# Patient Record
Sex: Female | Born: 1955
Health system: Southern US, Community
[De-identification: ages and names within clinical notes are randomized; demographics above are authoritative.]

## PROBLEM LIST (undated history)

## (undated) DIAGNOSIS — E0590 Thyrotoxicosis, unspecified without thyrotoxic crisis or storm: Secondary | ICD-10-CM

## (undated) DIAGNOSIS — M545 Low back pain, unspecified: Secondary | ICD-10-CM

## (undated) DIAGNOSIS — Z8679 Personal history of other diseases of the circulatory system: Secondary | ICD-10-CM

## (undated) DIAGNOSIS — G43909 Migraine, unspecified, not intractable, without status migrainosus: Secondary | ICD-10-CM

## (undated) DIAGNOSIS — Z86711 Personal history of pulmonary embolism: Secondary | ICD-10-CM

## (undated) DIAGNOSIS — M6282 Rhabdomyolysis: Secondary | ICD-10-CM

## (undated) DIAGNOSIS — S301XXA Contusion of abdominal wall, initial encounter: Secondary | ICD-10-CM

## (undated) DIAGNOSIS — I509 Heart failure, unspecified: Secondary | ICD-10-CM

## (undated) DIAGNOSIS — K219 Gastro-esophageal reflux disease without esophagitis: Secondary | ICD-10-CM

## (undated) DIAGNOSIS — F329 Major depressive disorder, single episode, unspecified: Secondary | ICD-10-CM

## (undated) DIAGNOSIS — Z8489 Family history of other specified conditions: Secondary | ICD-10-CM

## (undated) DIAGNOSIS — I10 Essential (primary) hypertension: Secondary | ICD-10-CM

## (undated) DIAGNOSIS — Z20828 Contact with and (suspected) exposure to other viral communicable diseases: Secondary | ICD-10-CM

## (undated) DIAGNOSIS — F419 Anxiety disorder, unspecified: Secondary | ICD-10-CM

## (undated) DIAGNOSIS — J9621 Acute and chronic respiratory failure with hypoxia: Secondary | ICD-10-CM

## (undated) DIAGNOSIS — F32A Depression, unspecified: Secondary | ICD-10-CM

## (undated) DIAGNOSIS — E119 Type 2 diabetes mellitus without complications: Secondary | ICD-10-CM

## (undated) DIAGNOSIS — J449 Chronic obstructive pulmonary disease, unspecified: Secondary | ICD-10-CM

## (undated) DIAGNOSIS — W19XXXA Unspecified fall, initial encounter: Secondary | ICD-10-CM

## (undated) DIAGNOSIS — M1990 Unspecified osteoarthritis, unspecified site: Secondary | ICD-10-CM

## (undated) DIAGNOSIS — E662 Morbid (severe) obesity with alveolar hypoventilation: Secondary | ICD-10-CM

## (undated) DIAGNOSIS — J209 Acute bronchitis, unspecified: Secondary | ICD-10-CM

## (undated) DIAGNOSIS — K922 Gastrointestinal hemorrhage, unspecified: Secondary | ICD-10-CM

## (undated) DIAGNOSIS — G8929 Other chronic pain: Secondary | ICD-10-CM

## (undated) DIAGNOSIS — S86892A Other injury of other muscle(s) and tendon(s) at lower leg level, left leg, initial encounter: Secondary | ICD-10-CM

## (undated) DIAGNOSIS — I480 Paroxysmal atrial fibrillation: Secondary | ICD-10-CM

## (undated) DIAGNOSIS — I2699 Other pulmonary embolism without acute cor pulmonale: Secondary | ICD-10-CM

## (undated) HISTORY — PX: DILATION AND CURETTAGE OF UTERUS: SHX78

## (undated) HISTORY — PX: EXCISIONAL HEMORRHOIDECTOMY: SHX1541

## (undated) HISTORY — PX: APPENDECTOMY: SHX54

## (undated) HISTORY — PX: TUBAL LIGATION: SHX77

## (undated) HISTORY — PX: BREAST CYST EXCISION: SHX579

## (undated) HISTORY — PX: ABDOMINAL HYSTERECTOMY: SHX81

## (undated) HISTORY — PX: WISDOM TOOTH EXTRACTION: SHX21

---
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
## 1998-03-15 ENCOUNTER — Emergency Department (HOSPITAL_COMMUNITY): Admission: EM | Admit: 1998-03-15 | Discharge: 1998-03-15 | Payer: Self-pay | Admitting: Emergency Medicine

## 1998-06-06 ENCOUNTER — Emergency Department (HOSPITAL_COMMUNITY): Admission: EM | Admit: 1998-06-06 | Discharge: 1998-06-06 | Payer: Self-pay | Admitting: Emergency Medicine

## 1998-10-03 ENCOUNTER — Encounter: Payer: Self-pay | Admitting: Emergency Medicine

## 1998-10-03 ENCOUNTER — Emergency Department (HOSPITAL_COMMUNITY): Admission: EM | Admit: 1998-10-03 | Discharge: 1998-10-03 | Payer: Self-pay | Admitting: Emergency Medicine

## 1999-12-04 ENCOUNTER — Emergency Department (HOSPITAL_COMMUNITY): Admission: EM | Admit: 1999-12-04 | Discharge: 1999-12-04 | Payer: Self-pay | Admitting: Emergency Medicine

## 2000-06-18 ENCOUNTER — Emergency Department (HOSPITAL_COMMUNITY): Admission: EM | Admit: 2000-06-18 | Discharge: 2000-06-18 | Payer: Self-pay | Admitting: Emergency Medicine

## 2000-07-14 ENCOUNTER — Emergency Department (HOSPITAL_COMMUNITY): Admission: EM | Admit: 2000-07-14 | Discharge: 2000-07-14 | Payer: Self-pay | Admitting: Emergency Medicine

## 2001-11-06 ENCOUNTER — Emergency Department (HOSPITAL_COMMUNITY): Admission: EM | Admit: 2001-11-06 | Discharge: 2001-11-06 | Payer: Self-pay

## 2001-11-21 ENCOUNTER — Ambulatory Visit (HOSPITAL_COMMUNITY): Admission: RE | Admit: 2001-11-21 | Discharge: 2001-11-21 | Payer: Self-pay | Admitting: Family Medicine

## 2002-03-29 ENCOUNTER — Encounter: Payer: Self-pay | Admitting: Family Medicine

## 2002-03-29 ENCOUNTER — Ambulatory Visit (HOSPITAL_COMMUNITY): Admission: RE | Admit: 2002-03-29 | Discharge: 2002-03-29 | Payer: Self-pay | Admitting: Family Medicine

## 2002-05-15 ENCOUNTER — Encounter: Payer: Self-pay | Admitting: Internal Medicine

## 2002-05-15 ENCOUNTER — Encounter: Payer: Self-pay | Admitting: Emergency Medicine

## 2002-05-15 ENCOUNTER — Inpatient Hospital Stay (HOSPITAL_COMMUNITY): Admission: EM | Admit: 2002-05-15 | Discharge: 2002-05-16 | Payer: Self-pay | Admitting: Emergency Medicine

## 2002-05-16 ENCOUNTER — Encounter: Payer: Self-pay | Admitting: Cardiovascular Disease

## 2002-12-09 ENCOUNTER — Emergency Department (HOSPITAL_COMMUNITY): Admission: EM | Admit: 2002-12-09 | Discharge: 2002-12-09 | Payer: Self-pay | Admitting: Emergency Medicine

## 2003-06-28 HISTORY — PX: THYROIDECTOMY, PARTIAL: SHX18

## 2003-07-15 ENCOUNTER — Ambulatory Visit (HOSPITAL_COMMUNITY): Admission: RE | Admit: 2003-07-15 | Discharge: 2003-07-15 | Payer: Self-pay | Admitting: Family Medicine

## 2003-09-29 ENCOUNTER — Encounter: Admission: RE | Admit: 2003-09-29 | Discharge: 2003-09-29 | Payer: Self-pay | Admitting: Family Medicine

## 2003-11-04 ENCOUNTER — Encounter: Admission: RE | Admit: 2003-11-04 | Discharge: 2003-11-04 | Payer: Self-pay | Admitting: Family Medicine

## 2003-11-19 ENCOUNTER — Encounter: Admission: RE | Admit: 2003-11-19 | Discharge: 2003-11-19 | Payer: Self-pay | Admitting: Family Medicine

## 2003-12-02 ENCOUNTER — Encounter: Admission: RE | Admit: 2003-12-02 | Discharge: 2003-12-02 | Payer: Self-pay | Admitting: Sports Medicine

## 2004-01-19 ENCOUNTER — Encounter: Admission: RE | Admit: 2004-01-19 | Discharge: 2004-01-19 | Payer: Self-pay | Admitting: Family Medicine

## 2004-01-20 ENCOUNTER — Encounter: Admission: RE | Admit: 2004-01-20 | Discharge: 2004-01-20 | Payer: Self-pay | Admitting: Family Medicine

## 2004-02-06 ENCOUNTER — Encounter: Admission: RE | Admit: 2004-02-06 | Discharge: 2004-02-06 | Payer: Self-pay | Admitting: Family Medicine

## 2004-02-13 ENCOUNTER — Encounter: Admission: RE | Admit: 2004-02-13 | Discharge: 2004-02-13 | Payer: Self-pay | Admitting: Sports Medicine

## 2004-03-30 ENCOUNTER — Encounter (INDEPENDENT_AMBULATORY_CARE_PROVIDER_SITE_OTHER): Payer: Self-pay | Admitting: Family Medicine

## 2004-03-30 ENCOUNTER — Encounter (INDEPENDENT_AMBULATORY_CARE_PROVIDER_SITE_OTHER): Payer: Self-pay | Admitting: *Deleted

## 2004-03-30 ENCOUNTER — Observation Stay (HOSPITAL_COMMUNITY): Admission: RE | Admit: 2004-03-30 | Discharge: 2004-03-31 | Payer: Self-pay | Admitting: General Surgery

## 2004-04-01 ENCOUNTER — Emergency Department (HOSPITAL_COMMUNITY): Admission: EM | Admit: 2004-04-01 | Discharge: 2004-04-01 | Payer: Self-pay | Admitting: Emergency Medicine

## 2004-07-14 ENCOUNTER — Ambulatory Visit: Payer: Self-pay | Admitting: Family Medicine

## 2004-08-20 ENCOUNTER — Ambulatory Visit: Payer: Self-pay | Admitting: Family Medicine

## 2004-11-19 ENCOUNTER — Ambulatory Visit: Payer: Self-pay | Admitting: Family Medicine

## 2004-12-10 ENCOUNTER — Emergency Department (HOSPITAL_COMMUNITY): Admission: EM | Admit: 2004-12-10 | Discharge: 2004-12-10 | Payer: Self-pay | Admitting: Emergency Medicine

## 2004-12-14 ENCOUNTER — Emergency Department (HOSPITAL_COMMUNITY): Admission: EM | Admit: 2004-12-14 | Discharge: 2004-12-14 | Payer: Self-pay | Admitting: *Deleted

## 2004-12-22 ENCOUNTER — Ambulatory Visit: Payer: Self-pay | Admitting: Family Medicine

## 2005-01-14 ENCOUNTER — Ambulatory Visit: Payer: Self-pay | Admitting: Family Medicine

## 2005-01-23 ENCOUNTER — Encounter (INDEPENDENT_AMBULATORY_CARE_PROVIDER_SITE_OTHER): Payer: Self-pay | Admitting: Family Medicine

## 2005-01-23 ENCOUNTER — Emergency Department (HOSPITAL_COMMUNITY): Admission: EM | Admit: 2005-01-23 | Discharge: 2005-01-23 | Payer: Self-pay | Admitting: Emergency Medicine

## 2005-03-11 ENCOUNTER — Encounter: Admission: RE | Admit: 2005-03-11 | Discharge: 2005-03-11 | Payer: Self-pay | Admitting: Allergy and Immunology

## 2005-03-13 ENCOUNTER — Emergency Department (HOSPITAL_COMMUNITY): Admission: EM | Admit: 2005-03-13 | Discharge: 2005-03-14 | Payer: Self-pay | Admitting: Emergency Medicine

## 2005-04-08 ENCOUNTER — Ambulatory Visit: Payer: Self-pay | Admitting: Family Medicine

## 2005-04-12 ENCOUNTER — Emergency Department (HOSPITAL_COMMUNITY): Admission: EM | Admit: 2005-04-12 | Discharge: 2005-04-13 | Payer: Self-pay | Admitting: Emergency Medicine

## 2005-05-18 ENCOUNTER — Ambulatory Visit: Payer: Self-pay | Admitting: Family Medicine

## 2005-06-01 ENCOUNTER — Ambulatory Visit: Payer: Self-pay | Admitting: Family Medicine

## 2005-06-27 HISTORY — PX: NASAL SINUS SURGERY: SHX719

## 2005-07-01 ENCOUNTER — Encounter: Admission: RE | Admit: 2005-07-01 | Discharge: 2005-07-01 | Payer: Self-pay | Admitting: Otolaryngology

## 2005-10-04 ENCOUNTER — Ambulatory Visit: Payer: Self-pay | Admitting: Family Medicine

## 2005-10-10 ENCOUNTER — Encounter (INDEPENDENT_AMBULATORY_CARE_PROVIDER_SITE_OTHER): Payer: Self-pay | Admitting: *Deleted

## 2005-10-10 ENCOUNTER — Ambulatory Visit (HOSPITAL_BASED_OUTPATIENT_CLINIC_OR_DEPARTMENT_OTHER): Admission: RE | Admit: 2005-10-10 | Discharge: 2005-10-10 | Payer: Self-pay | Admitting: Otolaryngology

## 2005-11-03 ENCOUNTER — Emergency Department (HOSPITAL_COMMUNITY): Admission: EM | Admit: 2005-11-03 | Discharge: 2005-11-03 | Payer: Self-pay | Admitting: Emergency Medicine

## 2006-01-28 ENCOUNTER — Observation Stay (HOSPITAL_COMMUNITY): Admission: EM | Admit: 2006-01-28 | Discharge: 2006-01-28 | Payer: Self-pay | Admitting: Emergency Medicine

## 2006-02-03 ENCOUNTER — Ambulatory Visit: Payer: Self-pay | Admitting: Family Medicine

## 2006-06-17 ENCOUNTER — Emergency Department (HOSPITAL_COMMUNITY): Admission: EM | Admit: 2006-06-17 | Discharge: 2006-06-17 | Payer: Self-pay | Admitting: Emergency Medicine

## 2006-08-17 ENCOUNTER — Encounter: Payer: Self-pay | Admitting: Family Medicine

## 2006-08-17 ENCOUNTER — Ambulatory Visit: Payer: Self-pay | Admitting: Family Medicine

## 2006-08-17 LAB — CONVERTED CEMR LAB
Sed Rate: 36 mm/hr — ABNORMAL HIGH (ref 0–22)
TSH: 2.103 microintl units/mL (ref 0.350–5.50)

## 2006-08-24 DIAGNOSIS — I10 Essential (primary) hypertension: Secondary | ICD-10-CM

## 2006-08-24 DIAGNOSIS — K219 Gastro-esophageal reflux disease without esophagitis: Secondary | ICD-10-CM

## 2006-08-24 DIAGNOSIS — J45909 Unspecified asthma, uncomplicated: Secondary | ICD-10-CM

## 2006-08-24 DIAGNOSIS — J309 Allergic rhinitis, unspecified: Secondary | ICD-10-CM | POA: Insufficient documentation

## 2006-08-24 DIAGNOSIS — F329 Major depressive disorder, single episode, unspecified: Secondary | ICD-10-CM

## 2006-08-24 DIAGNOSIS — G2589 Other specified extrapyramidal and movement disorders: Secondary | ICD-10-CM

## 2006-09-04 ENCOUNTER — Telehealth: Payer: Self-pay | Admitting: *Deleted

## 2006-09-18 ENCOUNTER — Emergency Department (HOSPITAL_COMMUNITY): Admission: EM | Admit: 2006-09-18 | Discharge: 2006-09-18 | Payer: Self-pay | Admitting: Emergency Medicine

## 2006-09-26 ENCOUNTER — Telehealth: Payer: Self-pay | Admitting: Family Medicine

## 2006-09-26 ENCOUNTER — Emergency Department (HOSPITAL_COMMUNITY): Admission: EM | Admit: 2006-09-26 | Discharge: 2006-09-26 | Payer: Self-pay | Admitting: Emergency Medicine

## 2006-11-08 ENCOUNTER — Ambulatory Visit: Payer: Self-pay | Admitting: Critical Care Medicine

## 2006-12-08 ENCOUNTER — Ambulatory Visit (HOSPITAL_COMMUNITY): Admission: RE | Admit: 2006-12-08 | Discharge: 2006-12-08 | Payer: Self-pay | Admitting: Family Medicine

## 2006-12-08 ENCOUNTER — Encounter (INDEPENDENT_AMBULATORY_CARE_PROVIDER_SITE_OTHER): Payer: Self-pay | Admitting: Family Medicine

## 2007-01-08 ENCOUNTER — Telehealth (INDEPENDENT_AMBULATORY_CARE_PROVIDER_SITE_OTHER): Payer: Self-pay | Admitting: *Deleted

## 2007-01-18 ENCOUNTER — Telehealth: Payer: Self-pay | Admitting: *Deleted

## 2007-01-22 ENCOUNTER — Encounter (INDEPENDENT_AMBULATORY_CARE_PROVIDER_SITE_OTHER): Payer: Self-pay | Admitting: Family Medicine

## 2007-01-22 ENCOUNTER — Ambulatory Visit: Payer: Self-pay | Admitting: Family Medicine

## 2007-01-22 LAB — CONVERTED CEMR LAB
KOH Prep: NEGATIVE
Whiff Test: POSITIVE

## 2007-01-24 LAB — CONVERTED CEMR LAB
Chlamydia, DNA Probe: NEGATIVE
GC Probe Amp, Genital: NEGATIVE

## 2007-03-06 ENCOUNTER — Encounter (INDEPENDENT_AMBULATORY_CARE_PROVIDER_SITE_OTHER): Payer: Self-pay | Admitting: Family Medicine

## 2007-04-13 ENCOUNTER — Ambulatory Visit: Payer: Self-pay | Admitting: Internal Medicine

## 2007-05-07 ENCOUNTER — Emergency Department (HOSPITAL_COMMUNITY): Admission: EM | Admit: 2007-05-07 | Discharge: 2007-05-08 | Payer: Self-pay | Admitting: Emergency Medicine

## 2007-07-13 ENCOUNTER — Emergency Department (HOSPITAL_COMMUNITY): Admission: EM | Admit: 2007-07-13 | Discharge: 2007-07-13 | Payer: Self-pay | Admitting: Emergency Medicine

## 2007-07-13 ENCOUNTER — Telehealth: Payer: Self-pay | Admitting: *Deleted

## 2007-07-23 ENCOUNTER — Telehealth (INDEPENDENT_AMBULATORY_CARE_PROVIDER_SITE_OTHER): Payer: Self-pay | Admitting: *Deleted

## 2007-08-14 ENCOUNTER — Encounter (INDEPENDENT_AMBULATORY_CARE_PROVIDER_SITE_OTHER): Payer: Self-pay | Admitting: Family Medicine

## 2007-08-17 ENCOUNTER — Telehealth: Payer: Self-pay | Admitting: *Deleted

## 2007-09-14 ENCOUNTER — Ambulatory Visit: Payer: Self-pay | Admitting: Family Medicine

## 2007-09-14 DIAGNOSIS — M130 Polyarthritis, unspecified: Secondary | ICD-10-CM

## 2007-10-03 ENCOUNTER — Telehealth (INDEPENDENT_AMBULATORY_CARE_PROVIDER_SITE_OTHER): Payer: Self-pay | Admitting: *Deleted

## 2007-10-15 ENCOUNTER — Telehealth: Payer: Self-pay | Admitting: *Deleted

## 2007-10-23 ENCOUNTER — Encounter (INDEPENDENT_AMBULATORY_CARE_PROVIDER_SITE_OTHER): Payer: Self-pay | Admitting: Family Medicine

## 2007-10-24 ENCOUNTER — Emergency Department (HOSPITAL_COMMUNITY): Admission: EM | Admit: 2007-10-24 | Discharge: 2007-10-24 | Payer: Self-pay | Admitting: Emergency Medicine

## 2007-10-26 ENCOUNTER — Ambulatory Visit: Payer: Self-pay | Admitting: Family Medicine

## 2007-11-12 ENCOUNTER — Encounter (INDEPENDENT_AMBULATORY_CARE_PROVIDER_SITE_OTHER): Payer: Self-pay | Admitting: Family Medicine

## 2007-11-15 ENCOUNTER — Encounter (INDEPENDENT_AMBULATORY_CARE_PROVIDER_SITE_OTHER): Payer: Self-pay | Admitting: Family Medicine

## 2007-12-31 ENCOUNTER — Emergency Department (HOSPITAL_COMMUNITY): Admission: EM | Admit: 2007-12-31 | Discharge: 2007-12-31 | Payer: Self-pay | Admitting: Emergency Medicine

## 2007-12-31 ENCOUNTER — Encounter (INDEPENDENT_AMBULATORY_CARE_PROVIDER_SITE_OTHER): Payer: Self-pay | Admitting: Family Medicine

## 2008-01-04 ENCOUNTER — Telehealth (INDEPENDENT_AMBULATORY_CARE_PROVIDER_SITE_OTHER): Payer: Self-pay | Admitting: Family Medicine

## 2008-01-04 ENCOUNTER — Encounter (INDEPENDENT_AMBULATORY_CARE_PROVIDER_SITE_OTHER): Payer: Self-pay | Admitting: Family Medicine

## 2008-01-04 ENCOUNTER — Ambulatory Visit: Payer: Self-pay | Admitting: Family Medicine

## 2008-01-04 DIAGNOSIS — Z87898 Personal history of other specified conditions: Secondary | ICD-10-CM | POA: Insufficient documentation

## 2008-01-04 LAB — CONVERTED CEMR LAB
ALT: 12 units/L (ref 0–35)
AST: 14 units/L (ref 0–37)
Albumin: 3.9 g/dL (ref 3.5–5.2)
Alkaline Phosphatase: 76 units/L (ref 39–117)
BUN: 9 mg/dL (ref 6–23)
CO2: 26 meq/L (ref 19–32)
Calcium: 9.2 mg/dL (ref 8.4–10.5)
Chloride: 99 meq/L (ref 96–112)
Cholesterol: 187 mg/dL (ref 0–200)
Creatinine, Ser: 0.76 mg/dL (ref 0.40–1.20)
Glucose, Bld: 95 mg/dL (ref 70–99)
HDL: 57 mg/dL (ref >39)
LDL Cholesterol: 104 mg/dL — ABNORMAL HIGH (ref 0–99)
Potassium: 3.4 meq/L — ABNORMAL LOW (ref 3.5–5.3)
Sodium: 139 meq/L (ref 135–145)
Total Bilirubin: 0.4 mg/dL (ref 0.3–1.2)
Total CHOL/HDL Ratio: 3.3
Total Protein: 8.1 g/dL (ref 6.0–8.3)
Triglycerides: 132 mg/dL (ref <150)
VLDL: 26 mg/dL (ref 0–40)

## 2008-01-08 ENCOUNTER — Telehealth (INDEPENDENT_AMBULATORY_CARE_PROVIDER_SITE_OTHER): Payer: Self-pay | Admitting: *Deleted

## 2008-01-10 ENCOUNTER — Encounter (INDEPENDENT_AMBULATORY_CARE_PROVIDER_SITE_OTHER): Payer: Self-pay | Admitting: Family Medicine

## 2008-02-18 ENCOUNTER — Telehealth (INDEPENDENT_AMBULATORY_CARE_PROVIDER_SITE_OTHER): Payer: Self-pay | Admitting: *Deleted

## 2008-04-09 ENCOUNTER — Telehealth (INDEPENDENT_AMBULATORY_CARE_PROVIDER_SITE_OTHER): Payer: Self-pay | Admitting: *Deleted

## 2008-04-18 ENCOUNTER — Ambulatory Visit: Payer: Self-pay | Admitting: Family Medicine

## 2008-04-18 DIAGNOSIS — M170 Bilateral primary osteoarthritis of knee: Secondary | ICD-10-CM | POA: Insufficient documentation

## 2008-04-18 DIAGNOSIS — N318 Other neuromuscular dysfunction of bladder: Secondary | ICD-10-CM

## 2008-05-19 ENCOUNTER — Ambulatory Visit: Payer: Self-pay | Admitting: Family Medicine

## 2008-05-19 ENCOUNTER — Encounter (INDEPENDENT_AMBULATORY_CARE_PROVIDER_SITE_OTHER): Payer: Self-pay | Admitting: Family Medicine

## 2008-05-20 ENCOUNTER — Telehealth: Payer: Self-pay | Admitting: *Deleted

## 2008-06-12 ENCOUNTER — Telehealth (INDEPENDENT_AMBULATORY_CARE_PROVIDER_SITE_OTHER): Payer: Self-pay | Admitting: Family Medicine

## 2008-08-19 ENCOUNTER — Telehealth: Payer: Self-pay | Admitting: *Deleted

## 2008-09-02 ENCOUNTER — Telehealth (INDEPENDENT_AMBULATORY_CARE_PROVIDER_SITE_OTHER): Payer: Self-pay | Admitting: Family Medicine

## 2009-07-17 ENCOUNTER — Other Ambulatory Visit: Payer: Self-pay | Admitting: Emergency Medicine

## 2009-07-18 ENCOUNTER — Ambulatory Visit: Payer: Self-pay | Admitting: Psychiatry

## 2009-07-18 ENCOUNTER — Inpatient Hospital Stay (HOSPITAL_COMMUNITY): Admission: AD | Admit: 2009-07-18 | Discharge: 2009-08-01 | Payer: Self-pay | Admitting: Psychiatry

## 2009-08-05 ENCOUNTER — Encounter: Payer: Self-pay | Admitting: Family Medicine

## 2009-09-17 ENCOUNTER — Encounter: Payer: Self-pay | Admitting: Family Medicine

## 2009-11-22 ENCOUNTER — Emergency Department (HOSPITAL_COMMUNITY): Admission: EM | Admit: 2009-11-22 | Discharge: 2009-11-22 | Payer: Self-pay | Admitting: Emergency Medicine

## 2009-12-11 ENCOUNTER — Encounter: Payer: Self-pay | Admitting: Family Medicine

## 2010-01-27 ENCOUNTER — Ambulatory Visit: Payer: Self-pay | Admitting: Family Medicine

## 2010-01-29 ENCOUNTER — Ambulatory Visit: Payer: Self-pay | Admitting: Family Medicine

## 2010-02-18 ENCOUNTER — Telehealth: Payer: Self-pay | Admitting: *Deleted

## 2010-03-17 ENCOUNTER — Telehealth: Payer: Self-pay | Admitting: Family Medicine

## 2010-04-17 ENCOUNTER — Emergency Department (HOSPITAL_COMMUNITY): Admission: EM | Admit: 2010-04-17 | Discharge: 2010-04-17 | Payer: Self-pay | Admitting: Pediatrics

## 2010-05-12 ENCOUNTER — Encounter: Payer: Self-pay | Admitting: Family Medicine

## 2010-07-18 ENCOUNTER — Encounter: Payer: Self-pay | Admitting: Family Medicine

## 2010-07-18 ENCOUNTER — Encounter: Payer: Self-pay | Admitting: Otolaryngology

## 2010-07-29 NOTE — Progress Notes (Signed)
Summary: resc   Phone Note Call from Patient   Summary of Call: has a head cold and is dizzy- doesn't feel safe driving - resch to 04/54 Initial call taken by: De Nurse,  March 17, 2010 8:35 AM  Follow-up for Phone Call        will forward message to MD. patient was on schedule for today. Follow-up by: Theresia Lo RN,  March 17, 2010 9:00 AM  Additional Follow-up for Phone Call Additional follow up Details #1::        that is fine to reschedule. Hope she feels better.  Additional Follow-up by: Jamie Brookes MD,  March 17, 2010 1:52 PM

## 2010-07-29 NOTE — Assessment & Plan Note (Signed)
Summary: HTN, got TB test   Vital Signs:  Patient profile:   55 year old female Height:      67.5 inches Weight:      273 pounds BMI:     42.28 Temp:     98.4 degrees F oral Pulse rate:   96 / minute BP sitting:   142 / 88  (left arm) Cuff size:   large  Vitals Entered By: Jimmy Footman, CMA (January 27, 2010 9:01 AM) CC: bp check, med refills Is Patient Diabetic? No Pain Assessment Patient in pain? no       Hearing Screen  20db HL: Left  500 hz: 20db 1000 hz: 20db 2000 hz: 20db 4000 hz: 20db Right  500 hz: 20db 1000 hz: 20db 2000 hz: 20db 4000 hz: 20db   Hearing Testing Entered By: Jimmy Footman, CMA (January 27, 2010 9:59 AM)   Primary Care Provider:  . [facility]  CC:  bp check and med refills.  History of Present Illness: Pt moved back into the area (was living in [place] with son) last November of 2010. She is here today to reestablish care and to get some forms filled out. 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 She needs some forms filled out.   She did not have any medications filled out in our system. She had mutliple meds and it took over 30 mintues of face to face time discussing meds and filling them into our system   Hypertension: Pt has been to see several doctors in the area. She has multiple medications from several different providers of which we did not have any entered into our database. Her BP is slightly elevated today. She has been told by her different providers that she needs to reestablish care with a PCP so we can work on her hypertension. She is not currently taking anything for her hypertension.   Habits & Providers  Alcohol-Tobacco-Diet     Tobacco Status: quit  Current Medications (verified): 1)  Sertraline Hcl 50 Mg Tabs (Sertraline Hcl) .Marland Kitchen.. 1 Po Every Evening 2)  Trazodone Hcl 100 Mg Tabs (Trazodone Hcl) .... Take 1 Pill For Sleep Prn 3)  Abilify 2 Mg Tabs (Aripiprazole) .... Take 1 Tab By  Mouth Qam 4)  Cetirizine Hcl 10 Mg Tabs (Cetirizine Hcl) .Marland Kitchen.. 1 By Mouth Qday For Allergies 5)  Ibuprofen 200 Mg Tabs (Ibuprofen) .... Take 1-2 Pills As Needed Arthritis 6)  Excedrine Migraine .... Take Prn 7)  Omeprazole 20 Mg Cpdr (Omeprazole) .Marland Kitchen.. 1 Tab By Mouth Bid 8)  Proventil Hfa 108 (90 Base) Mcg/act Aers (Albuterol Sulfate) .... Take 1-2 Puffs Up To Every 4 Hours As Needed For Asthma and Shortness of Breath 9)  Prednisone 10 Mg Tabs (Prednisone) .... Pt Takes As Needed and Calls Dr. Willa Rough When She Needs It. 10)  Hydroxyzine Hcl 50 Mg Tabs (Hydroxyzine Hcl) .... Take 1-2 Tabs Qam and 1-2 Tabs Qpm 11)  Voltaren 1 % Gel (Diclofenac Sodium) .... Use Daily On Knees and Shoulder 12)  Tramadol Hcl 50 Mg Tabs (Tramadol Hcl) .Marland Kitchen.. 1 Pill Every 6-8 Hours As Needed For Severe Pain. 13)  Symbicort 160-4.5 Mcg/act Aero (Budesonide-Formoterol Fumarate) .... Take 2 Puffs Every 12 Hours To Prevent Cough or Wheeze 14)  Astepro 0.15 % Soln (Azelastine Hcl) .... Intill 1 Drop in Each Eye 2 Times A Day 15)  Patanase 0.6 % Soln (Olopatadine Hcl) .... Instill 1-2 Sprays Into Each Nostril  Every Pm For Congestion 16)  Hydrochlorothiazide 25 Mg Tabs (Hydrochlorothiazide) .... Take 1 Pill Every Morning For High Blood Pressure  Allergies (verified): 1)  ! Septra 2)  ! Cipro 3)  ! Erythromycin  Review of Systems        vitals reviewed and pertinent negatives and positives seen in HPI   Physical Exam  General:  Well-developed,well-nourished,in no acute distress; alert,appropriate and cooperative throughout examination Lungs:  Normal respiratory effort, chest expands symmetrically. Lungs are clear to auscultation, no crackles or wheezes. Heart:  Normal rate and regular rhythm. S1 and S2 normal without gallop, murmur, click, rub or other extra sounds.   Impression & Recommendations:  Problem # 1:  HYPERTENSION, BENIGN SYSTEMIC (ICD-401.1) Assessment New Pt is seen here to reestablish care after being  gone for 2 years. We spent > 35 minutes going over medications and entering them in the computer with face-to-face discussion and advising. She has slightly elevated BP today. Will begin treatment with HCTZ at 25 mg which is what she use to take. Will see her back in about 3 weeks.   Her updated medication list for this problem includes:    Hydrochlorothiazide 25 Mg Tabs (Hydrochlorothiazide) .Marland Kitchen... Take 1 pill every morning for high blood pressure  Orders: [facility]- Est  Level 4 (99214)  Complete Medication List: 1)  Sertraline Hcl 50 Mg Tabs (Sertraline hcl) .Marland Kitchen.. 1 po every evening 2)  Trazodone Hcl 100 Mg Tabs (Trazodone hcl) .... Take 1 pill for sleep prn 3)  Abilify 2 Mg Tabs (Aripiprazole) .... Take 1 tab by mouth qam 4)  Cetirizine Hcl 10 Mg Tabs (Cetirizine hcl) .Marland Kitchen.. 1 by mouth qday for allergies 5)  Ibuprofen 200 Mg Tabs (Ibuprofen) .... Take 1-2 pills as needed arthritis 6)  Excedrine Migraine  .... Take prn 7)  Omeprazole 20 Mg Cpdr (Omeprazole) .Marland Kitchen.. 1 tab by mouth bid 8)  Proventil Hfa 108 (90 Base) Mcg/act Aers (Albuterol sulfate) .... Take 1-2 puffs up to every 4 hours as needed for asthma and shortness of breath 9)  Prednisone 10 Mg Tabs (Prednisone) .... Pt takes as needed and calls dr. Willa Rough when she needs it. 10)  Hydroxyzine Hcl 50 Mg Tabs (Hydroxyzine hcl) .... Take 1-2 tabs qam and 1-2 tabs qpm 11)  Voltaren 1 % Gel (Diclofenac sodium) .... Use daily on knees and shoulder 12)  Tramadol Hcl 50 Mg Tabs (Tramadol hcl) .Marland Kitchen.. 1 pill every 6-8 hours as needed for severe pain. 13)  Symbicort 160-4.5 Mcg/act Aero (Budesonide-formoterol fumarate) .... Take 2 puffs every 12 hours to prevent cough or wheeze 14)  Astepro 0.15 % Soln (Azelastine hcl) .... Intill 1 drop in each eye 2 times a day 15)  Patanase 0.6 % Soln (Olopatadine hcl) .... Instill 1-2 sprays into each nostril every pm for congestion 16)  Hydrochlorothiazide 25 Mg Tabs (Hydrochlorothiazide) .... Take 1 pill every morning  for high blood pressure  Other Orders: Hearing- [facility] ([address]) TB Skin Test [phone]) Admin 1st Vaccine (64403) Future Orders: Lipid-[facility] ([id number]) ... 02/19/2011 Comp Met-[facility] ([id number]) ... 02/10/2011  Patient Instructions: 1)  It was nice to meet you today.  2)  There are some fasting labs and some other issues we need to address in the future appointments. I would like to see you again n about 3 weeks.  3)  You are getting a TB test today. Come back on Friday to get it "read", then we can finish filling out your paper.  4)  You have started  a new BP med today. Start taking it today.  Prescriptions: HYDROCHLOROTHIAZIDE 25 MG TABS (HYDROCHLOROTHIAZIDE) take 1 pill every morning for high blood pressure  #31 x 1   Entered and Authorized by:    Brookes MD   Signed by:    Brookes MD on 01/27/2010   Method used:   Electronically to        [facility] (retail)       [address]       [place], [place]  [id number]       Ph: [phone]       Fax: [phone]   RxID:   [id number] PATANASE 0.6 % SOLN (OLOPATADINE HCL) instill 1-2 sprays into each nostril every pm for congestion  #1 x 0   Entered and Authorized by:    Brookes MD   Signed by:    Brookes MD on 01/27/2010   Method used:   Historical   RxID:   [id number] ASTEPRO 0.15 % SOLN (AZELASTINE HCL) intill 1 drop in each eye 2 times a day  #1 x 6   Entered and Authorized by:    Brookes MD   Signed by:    Brookes MD on 01/27/2010   Method used:   Historical   RxID:   [id number] SYMBICORT 160-4.5 MCG/ACT AERO (BUDESONIDE-FORMOTEROL FUMARATE) take 2 puffs every 12 hours to prevent cough or wheeze  #2 x 0   Entered and Authorized by:    Brookes MD   Signed by:    Brookes MD on 01/27/2010   Method used:   Historical   RxID:   [id number] TRAMADOL HCL 50 MG TABS (TRAMADOL HCL) 1 pill every 6-8 hours as needed for severe pain.  #30 x 0    Entered and Authorized by:    Brookes MD   Signed by:    Brookes MD on 01/27/2010   Method used:   Historical   RxID:   [id number] HYDROXYZINE HCL 50 MG TABS (HYDROXYZINE HCL) take 1-2 tabs qAM and 1-2 tabs qPM  #120 x 0   Entered and Authorized by:    Brookes MD   Signed by:    Brookes MD on 01/27/2010   Method used:   Historical   RxID:   [id number] PROVENTIL HFA 108 (90 BASE) MCG/ACT AERS (ALBUTEROL SULFATE) take 1-2 puffs up to every 4 hours as needed for asthma and shortness of breath  #1 x 0   Entered and Authorized by:    Brookes MD   Signed by:    Brookes MD on 01/27/2010   Method used:   Historical   RxID:   [id number] OMEPRAZOLE 20 MG CPDR (OMEPRAZOLE) 1 tab by mouth BID  #60 x 0   Entered and Authorized by:    Brookes MD   Signed by:    Brookes MD on 01/27/2010   Method used:   Historical   RxID:   [id number] IBUPROFEN 200 MG TABS (IBUPROFEN) take 1-2 pills as needed arthritis  #30 x 0   Entered and Authorized by:    Brookes MD   Signed by:    Brookes MD on 01/27/2010   Method used:   Historical   RxID:   [id number] CETIRIZINE HCL 10 MG TABS (CETIRIZINE HCL) 1 by mouth qDay for allergies  #30 x 0   Entered and Authorized by:    Brookes MD   Signed by:    Brookes MD on 01/27/2010   Method used:   Historical  RxID:   [id number] ABILIFY 2 MG TABS (ARIPIPRAZOLE) take 1 tab by mouth qAM  #30 x 0   Entered and Authorized by:    Brookes MD   Signed by:    Brookes MD on 01/27/2010   Method used:   Historical   RxID:   [id number] TRAZODONE HCL 100 MG TABS (TRAZODONE HCL) take 1 pill for sleep PRN  #30 x 0   Entered and Authorized by:    Brookes MD   Signed by:    Brookes MD on 01/27/2010   Method used:   Historical   RxID:   [id number] SERTRALINE HCL 50 MG TABS (SERTRALINE HCL) 1 Po every evening  #30 x 0   Entered and Authorized  by:    Brookes MD   Signed by:    Brookes MD on 01/27/2010   Method used:   Historical   RxID:   [id number]    Orders Added: 1)  Lipid-[facility] [[id number]] 2)  Comp Met-[facility] [[id number]] 3)  Hearing- [facility] [92551] 4)  TB Skin Test [86580] 5)  Admin 1st Vaccine [90471] 6)  [facility]- Est  Level 4 [64332]    Immunizations Administered:  PPD Skin Test:    Vaccine Type: PPD    Site: right forearm    Mfr: Sanofi Pasteur    Dose: 0.1 ml    Route: ID    Given by: Jimmy Footman, CMA    Exp. Date: 02/12/2010    Lot #: [id number]

## 2010-07-29 NOTE — Progress Notes (Signed)
Summary: phn msg   Phone Note Call from Patient Call back at Home Phone [phone]   Caller: Patient Summary of Call: Pt did not come to appt today due to having a migraine, but also under the impression that she was to have some sort of testing done before she came to this f/up and it was never scheduled. Initial call taken by: Clydell Hakim,  February 18, 2010 3:22 PM  Follow-up for Phone Call        spoke with pt and informed her that she was to have bloodwork done prior to visit today.  i told her that she would need to be fasting for this bloodwork. she understood and will call and make an appt for the labs to be drawn Follow-up by: Loralee Pacas CMA,  February 18, 2010 3:29 PM

## 2010-07-29 NOTE — Miscellaneous (Signed)
   Clinical Lists Changes  Medications: Removed medication of TRAMADOL HCL 50 MG TABS (TRAMADOL HCL) 1 tablet by mouth three times a day as needed for severe pain when Diclofenac doesn't work

## 2010-07-29 NOTE — Miscellaneous (Signed)
   Clinical Lists Changes  Medications: Removed medication of ALBUTEROL SULFATE (2.5 MG/3ML) 0.083%  NEBU (ALBUTEROL SULFATE) 1 every 4 hours as needed Removed medication of SYMBICORT 160-4.5 MCG/ACT  AERO (BUDESONIDE-FORMOTEROL FUMARATE) 2 puffs two times a day - per PULMONOLOGIST Removed medication of XOPENEX HFA 45 MCG/ACT  AERO (LEVALBUTEROL TARTRATE) 1-2 puffs every 4 hours as needed for shortness of breath Removed medication of HYDROCHLOROTHIAZIDE 25 MG  TABS (HYDROCHLOROTHIAZIDE) 1 tablet by mouth daily Removed medication of FEXOFENADINE HCL 180 MG  TABS (FEXOFENADINE HCL) 1 tablet by mouth daily Removed medication of RHINOCORT AQUA 32 MCG/ACT  SUSP (BUDESONIDE (NASAL)) 1 spray each nostril two times a day Removed medication of OPTIVAR 0.05 %  SOLN (AZELASTINE HCL) 1 drop each eye two times a day as needed for itchy eyes Removed medication of TRAZODONE HCL 50 MG  TABS (TRAZODONE HCL) 1 tablet by mouth at bedtime Removed medication of DETROL 2 MG  TABS (TOLTERODINE TARTRATE) 1 tablet by mouth two times a day Removed medication of OMEPRAZOLE 20 MG  TBEC (OMEPRAZOLE) 1 tablet by mouth two times a day Removed medication of FLEXERIL 10 MG  TABS (CYCLOBENZAPRINE HCL) 1 tab by mouth three times a day as needed for neck pain / tension headache - do not drive after taking Removed medication of DICLOFENAC SODIUM 75 MG TBEC (DICLOFENAC SODIUM) 1 tablet by mouth two times a day with food - stop taking Meloxicam Removed medication of QUALAQUIN 324 MG CAPS (QUININE SULFATE) 1 tablet by mouth at bedtime for restless legs

## 2010-07-29 NOTE — Assessment & Plan Note (Signed)
Summary: READ [facility]   Nurse Visit   Allergies: 1)  ! Septra 2)  ! Cipro 3)  ! Erythromycin  PPD Results    Date of reading: 01/29/2010    Results: 0 mm    Interpretation: negative  Orders Added: 1)  No Charge Patient Arrived (NCPA0) [NCPA0]

## 2010-07-29 NOTE — Miscellaneous (Signed)
  Clinical Lists Changes  Problems: Changed problem from ASTHMA, UNSPECIFIED (ICD-493.90) to ASTHMA, PERSISTENT (ICD-493.90) 

## 2010-07-29 NOTE — Miscellaneous (Signed)
   Clinical Lists Changes  Problems: Removed problem of MUSCLE CONTRACTION HEADACHES (ICD-307.81) Removed problem of REMOVAL OF THYROID LESION, HX OF (ICD-V12.2) Removed problem of SCREENING OTHER&UNSPEC CARDIOVASCULAR CONDITIONS (ICD-V81.2) Removed problem of EXPOSURE TO VIRAL DISEASE, NEC (ICD-V01.79)

## 2010-08-14 ENCOUNTER — Other Ambulatory Visit: Payer: Self-pay | Admitting: Family Medicine

## 2010-08-26 ENCOUNTER — Ambulatory Visit: Payer: Self-pay | Admitting: Family Medicine

## 2010-09-07 ENCOUNTER — Ambulatory Visit (INDEPENDENT_AMBULATORY_CARE_PROVIDER_SITE_OTHER): Payer: Medicare Other | Admitting: Family Medicine

## 2010-09-07 ENCOUNTER — Encounter: Payer: Self-pay | Admitting: Family Medicine

## 2010-09-07 VITALS — BP 142/96 | HR 80 | Temp 98.5°F | Ht 67.5 in | Wt 273.2 lb

## 2010-09-07 DIAGNOSIS — I10 Essential (primary) hypertension: Secondary | ICD-10-CM

## 2010-09-07 LAB — BASIC METABOLIC PANEL
CO2: 25 mEq/L (ref 19–32)
Calcium: 9.1 mg/dL (ref 8.4–10.5)
Chloride: 102 mEq/L (ref 96–112)
Glucose, Bld: 87 mg/dL (ref 70–99)
Potassium: 3.2 mEq/L — ABNORMAL LOW (ref 3.5–5.3)
Sodium: 141 mEq/L (ref 135–145)

## 2010-09-07 LAB — CONVERTED CEMR LAB
BUN: 12 mg/dL (ref 6–23)
Calcium: 9.1 mg/dL (ref 8.4–10.5)
Chloride: 102 meq/L (ref 96–112)
Creatinine, Ser: 0.69 mg/dL (ref 0.40–1.20)

## 2010-09-07 MED ORDER — LISINOPRIL-HYDROCHLOROTHIAZIDE 20-25 MG PO TABS: 1.0000 | ORAL_TABLET | Freq: Every day | ORAL | Status: DC

## 2010-09-07 NOTE — Patient Instructions (Signed)
It was nice to meet you today We will start you on Lisinopril-HCTZ today We will check your kidney function today and in 6 weeks Please schedule a follow up appointment in 6 weeks to recheck your blood pressure

## 2010-09-07 NOTE — Progress Notes (Signed)
  Subjective:    Patient ID: [name] White, female    DOB: 06-05-1956, 55 y.o.   MRN: [id number]  Hypertension This is a chronic problem. The current episode started more than 1 year ago. The problem has been gradually worsening since onset. The problem is uncontrolled. Pertinent negatives include no anxiety, blurred vision, chest pain, headaches, palpitations or shortness of breath. There are no associated agents to hypertension. Risk factors for coronary artery disease include obesity and sedentary lifestyle. Past treatments include diuretics. The current treatment provides no improvement. There are no compliance problems.  There is no history of kidney disease, CAD/MI or heart failure.  [facility] have been checking her blood pressure.  This morning it was 160/110.    Review of Systems  Eyes: Negative for blurred vision.  Respiratory: Negative for shortness of breath.   Cardiovascular: Negative for chest pain and palpitations.  Neurological: Negative for headaches.       Objective:   Physical Exam  Constitutional: No distress.       Obese  Eyes:       Fundoscopic exam benign   Neck: No thyromegaly present.  Cardiovascular: Normal rate, regular rhythm and normal heart sounds.   Pulmonary/Chest: Effort normal and breath sounds normal.  Musculoskeletal: She exhibits no edema.          Assessment & Plan:

## 2010-09-07 NOTE — Assessment & Plan Note (Signed)
BP not at goal with just HCTZ.  Will start Lisinopril-HCTZ.  Check BMET today and in 6 weeks.

## 2010-09-12 LAB — URINALYSIS, ROUTINE W REFLEX MICROSCOPIC
Bilirubin Urine: NEGATIVE
Glucose, UA: NEGATIVE mg/dL
Ketones, ur: NEGATIVE mg/dL
pH: 7 (ref 5.0–8.0)

## 2010-09-12 LAB — COMPREHENSIVE METABOLIC PANEL
AST: 35 U/L (ref 0–37)
Albumin: 3.4 g/dL — ABNORMAL LOW (ref 3.5–5.2)
BUN: 9 mg/dL (ref 6–23)
Calcium: 8.9 mg/dL (ref 8.4–10.5)
Creatinine, Ser: 0.72 mg/dL (ref 0.4–1.2)
GFR calc Af Amer: >60 mL/min (ref >60)
GFR calc non Af Amer: >60 mL/min (ref >60)

## 2010-09-12 LAB — RAPID URINE DRUG SCREEN, HOSP PERFORMED
Amphetamines: NOT DETECTED
Benzodiazepines: NOT DETECTED
Cocaine: NOT DETECTED
Opiates: NOT DETECTED
Tetrahydrocannabinol: NOT DETECTED

## 2010-09-12 LAB — CBC
HCT: 40 % (ref 36.0–46.0)
MCHC: 33.3 g/dL (ref 30.0–36.0)
MCV: 95.6 fL (ref 78.0–100.0)
Platelets: 369 10*3/uL (ref 150–400)

## 2010-09-12 LAB — DIFFERENTIAL
Basophils Absolute: 0 10*3/uL (ref 0.0–0.1)
Eosinophils Relative: 4 % (ref 0–5)
Lymphocytes Relative: 29 % (ref 12–46)
Lymphs Abs: 2.2 10*3/uL (ref 0.7–4.0)
Neutro Abs: 4.6 10*3/uL (ref 1.7–7.7)

## 2010-09-12 LAB — ACETAMINOPHEN LEVEL: Acetaminophen (Tylenol), Serum: 52.2 ug/mL — ABNORMAL HIGH (ref 10–30)

## 2010-09-12 LAB — LIPASE, BLOOD: Lipase: 20 U/L (ref 11–59)

## 2010-09-13 LAB — GLUCOSE, CAPILLARY

## 2010-10-20 ENCOUNTER — Ambulatory Visit: Payer: Medicare Other | Admitting: Family Medicine

## 2010-10-24 ENCOUNTER — Ambulatory Visit (INDEPENDENT_AMBULATORY_CARE_PROVIDER_SITE_OTHER): Payer: Medicare Other

## 2010-10-24 ENCOUNTER — Inpatient Hospital Stay (INDEPENDENT_AMBULATORY_CARE_PROVIDER_SITE_OTHER)
Admission: RE | Admit: 2010-10-24 | Discharge: 2010-10-24 | Disposition: A | Discharge status: Home / Self Care | Payer: Medicare Other | Source: Ambulatory Visit | Attending: Family Medicine | Admitting: Family Medicine

## 2010-10-24 DIAGNOSIS — S40019A Contusion of unspecified shoulder, initial encounter: Secondary | ICD-10-CM

## 2010-10-24 DIAGNOSIS — T07XXXA Unspecified multiple injuries, initial encounter: Secondary | ICD-10-CM

## 2010-10-24 DIAGNOSIS — IMO0002 Reserved for concepts with insufficient information to code with codable children: Secondary | ICD-10-CM

## 2010-11-09 NOTE — Assessment & Plan Note (Signed)
[facility]                             PULMONARY OFFICE NOTE   [name] White, [name] White                    MRN:          [id number]  DATE:11/08/2006                            DOB:          1956/05/19    CHIEF COMPLAINT:  Evaluate asthma.   HISTORY OF PRESENT ILLNESS:  A 55 year old African American female who  has been diagnosed with asthma since 2005, symptoms include that of  chest discomfort, wheezing, throat tightness, cough that is productive  of clear mucus, occasionally cloudy yellow mucus also is being  productive.  Some chest tightness is noted. She notes significant acid  reflux symptoms which is only partially controlled with Prilosec. She  notes sore throat, difficultly swallowing, hoarseness. She is short of  breath with activity and at rest. She has 2 types of degree of dyspnea,  some is where is feeling tight in the throat and otherwise some  tightness in the lower chest. She has had some difficultly swallowing,  nasal congestion noted, some headaches, sneezing, and itching. Noted  anxiety, depression, hand, and foot swelling. Patient smoked less than a  pack a day for 30 years, quit smoking in the year 2001.   PAST MEDICAL HISTORY:   MEDICAL HISTORY:  Hypertension, heart dysrhythmias, asthma, allergies,  chronic headaches.   OPERATIVE HISTORY:  Sinus surgery in the year 2007, hysterectomy in  1992, tubal ligation 1980, hemorrhoidectomy in the past. Also history of  restless leg syndrome, osteoarthritis, cystic acne, eczema.   MEDICATION ALLERGIES:  None.   CURRENT MEDICATIONS:  1. Asthmanex 2 sprays b.i.d.  2. Omeprazole 20 mg daily.  3. Nasal saline wash b.i.d. to t.i.d.  4. Rhinocort 1 spray each nostril daily.  5. Optivar 1 drop each eye daily.  6. Qualaquin 324 mg daily.  7. Celebrex p.r.n.  8. Hydrochlorothiazide 12.5 mg daily.  9. Trazodone 50 mg daily.  10.Detrol 2 mg b.i.d.  11.Paxil 40 mg daily.  12.Premarin  0.625 mg daily.  13.Alavert daily.  14.Xopenex HFA p.r.n.  15.Albuterol nebulizer p.r.n.   MEDICATION ALLERGIES:  None.   FAMILY HISTORY:  Positive for allergies and asthma in her mother. Heart  disease in mother. Paternal aunt and uncle had cancer.   REVIEW OF SYSTEMS:  Otherwise is noncontributory.   PHYSICAL EXAMINATION:  Temperature 97.3, blood pressure 126/80, pulse  81, saturation was 97% in room air.  CHEST: Showed distant breath sounds with prolonged expiratory phase. No  wheeze or rhonchi were noted.  CARDIAC EXAM: Showed a regular rate and rhythm without S3. Normal S1,  S2.  ABDOMEN: Soft, nontender.  EXTREMITIES: Showed no edema, clubbing, or venous disease.  SKIN: Clear.  NEUROLOGIC EXAM: Intact.  There was prominent pseudo wheeze on the neck exam.   LABORATORY DATA:  Chest x-ray showed no active disease process.  Pulmonary functions were obtained, spirometry revealed moderate  peripheral air flow obstruction with __________ at 78% predicted,  improved to 90% predicted with dilator.   IMPRESSION:  That of combination of vocal cord dysfunction syndrome,  reflux disease, allergic rhinitis, and moderate intermittent asthma.   RECOMMENDATIONS:  Switch  Prilosec to Zegerid 40 mg daily, begin BroveX 5  cc p.o. b.i.d., and discontinue Claritin, and alavert. Continue  asthmanex as currently prescribed. I educated the patient with regards  to the use of the Xopenex inhaler on a p.r.n. basis only in trying to  sort out how much of this is upper airway and lower airway obstruction.  An IGE assay will be obtained. She will be assessed for potential Xolair  candidacy.     Charlcie Cradle Delford Field, MD, [facility]  Electronically Signed    PEW/MedQ  DD: 11/09/2006  DT: 11/09/2006  Job #: [id number]   cc:   Meliton Rattan. Willa Rough, M.D.  Dr. Kerby Nora

## 2010-11-09 NOTE — Assessment & Plan Note (Signed)
[facility]                             PULMONARY OFFICE NOTE   BIRTTANY, DECHELLIS                    MRN:          [id number]  DATE:04/13/2007                            DOB:          Jul 24, 1955    HISTORY OF PRESENT ILLNESS:  The patient is a 55 year old African  American female, a patient of Dr. Delford Field, who has a known history of  asthma diagnosed since 2005, presents today for an acute office visit.  The patient was seen for an initial consult in May 2008, and was felt to  have some moderate intermittent asthma complicated by vocal cord  dysfunction syndrome and reflux disease.  She was changed over from  Prilosec to Zegerid daily and begun on Brovex 1 teaspoon twice daily.  The patient reports that she had significant improvement in symptoms but  complains that over the last 2 weeks she has had increased nasal  congestion, postnasal drip, intermittent wheezing, and dry cough.  She  was seen by her physician, Dr. Willa Rough, and given a steroid pack and  changed over from Asthmanex to Symbicort.  The patient reports her  symptoms have improved, however, she continues to have some hoarseness,  postnasal drip, and nasal stuffiness.  The patient denies any purulent  sputum, fever, chest pain, orthopnea, PND.  The patient does complain of  some intermittent wheezing, however, it is much improved over the last 2  weeks.   PAST MEDICAL HISTORY:  Reviewed.   CURRENT MEDICATIONS:  Reviewed.   PHYSICAL EXAMINATION:  GENERAL:  The patient is a pleasant female in no  acute distress.  VITAL SIGNS:  She is afebrile with stable vital signs.  O2 saturation is  98% on room air.  HEENT:  Reveals nasal mucosa with mild edema and clear discharge.  Posterior pharynx is clear.  Nontender sinuses.  Conjunctivae are not  injected.  RESPIRATORY:  Lung sounds are clear.  CARDIAC:  Regular rate.  ABDOMEN:  Soft and nontender.  EXTREMITIES:  Warm without any  edema.   IMPRESSION/PLANA:  1. Recent asthmatic exacerbation with rhinitis flare.  The patient is      to restart Brovex 1 teaspoon twice daily as needed, 3 refills were      given.  The patient is to change Rhinocort over to Veramyst nasal      spray 1 puff twice daily and add in Mucinex DM twice daily.  The      patient will continue on Symbicort as recommended.  She will follow      back up with Dr.      Delford Field in 1 month or sooner if needed.  2. Influenza vaccine was given today.      Rubye Oaks, NP  Electronically Signed      Charlcie Cradle Delford Field, MD, [facility]  Electronically Signed   TP/MedQ  DD: 04/13/2007  DT: 04/15/2007  Job #: [id number]   cc:   Meliton Rattan. Willa Rough, M.D.  Sharin Grave, MD

## 2010-11-12 NOTE — Op Note (Signed)
[name] White, [name] White             ACCOUNT NO.:  [id number]   MEDICAL RECORD NO.:  [id number]          PATIENT TYPE:  AMB   LOCATION:  DAY                          FACILITY:  [facility]   PHYSICIAN:  Gita Kudo, M.D. DATE OF BIRTH:  11-22-1955   DATE OF PROCEDURE:  03/30/2004  DATE OF DISCHARGE:                                 OPERATIVE REPORT   PREOPERATIVE DIAGNOSIS:  Enlarged thyroid with right substernal mass.   POSTOPERATIVE DIAGNOSIS:  Enlarged right lobe of thyroid with substernal  extension, left lobe normal.  The right nerve and parathyroids identified  and not injured.   OPERATIVE PROCEDURES:  1.  Right thyroid lobectomy.  2.  Explore left neck.   SURGEON:  Gita Kudo, M.D.   ASSISTANTS:  Velora Heckler, M.D.   ANESTHESIA:  General endotracheal.   CLINICAL SUMMARY:  A 55 year old asthmatic female with CT scan done for  chest problems showing a mass in her superior mediastinum that was  consistent with a substernal thyroid.  Physical examination showed her to  have a little bit of airway impingement.  She does not have a large mass  palpable in the neck, since it is mostly under the clavicle.   OPERATIVE FINDINGS:  The patient had a large, bilobed right thyroid lobe.  The lobes were about equal in size and one was totally beneath the clavicle  and pressing on the thyroid, and the other was large and extended downward  also but not as far.  The left lobe looked and felt normal.   OPERATIVE PROCEDURE:  Under satisfactory general endotracheal anesthesia,  the patient was positioned, prepped and draped in a standard fashion.  A  transverse incision made in the neck and carried down to the platysma.  Bleeders were coagulated.  Flaps developed superiorly and inferiorly and a  self-retaining retractor placed.  The midline was opened and then the left  side looked at briefly and looked and felt normal.  The right side was  enlarged and then finger dissection showed  that the course of it extended  down into the superior mediastinum.  Gentle finger dissection was used to  help bring the mass up out of the mediastinum.  I could feel the innominate  artery quite well, and it was close to the mass.  After the thyroid was  brought up, the lobectomy performed.  The superior pole vessels were taken  down between ties of silk and clips.  The lateral tissue was divided with  cautery and the middle vein and inferior thyroid artery branches are taken  very close to the thyroid gland with clips and cut.  The large inferior vein  was divided between ties of silk.  The gland was then retracted medially and  after identifying the parathyroids and recurrent, it was taken off the  trachea with cautery.  The isthmus was clamped near the junction of the left  lobe and divided.  This was then tied with silk.  The specimen was sent for  examination.  The wound was made dry by cautery and metal clips.  The bed of  the wound also had a few pieces of Surgicel placed.  Then the wound closed  in layers with running 3-0 Vicryl for the midline, interrupted 4-0 Vicryl  for the platysma, and then the skin with Steri-Strips.   I spoke with the pathologist, who had done a touch prep and looked at the  gland and though it was just a benign goiter.  Accordingly, the patient then  had a sterile absorbent dressing applied and went to the recovery room from  the operating room in good condition.      MRL/MEDQ  D:  03/30/2004  T:  03/30/2004  Job:  [id number]   cc:   Georgina Peer, M.D.  [address] [place], [place] [address]  Fax: [phone]

## 2010-11-12 NOTE — Discharge Summary (Signed)
[name], White             ACCOUNT NO.:  [id number]   MEDICAL RECORD NO.:  [id number]          PATIENT TYPE:  OBV   LOCATION:  4715                         FACILITY:  [facility]   PHYSICIAN:  Norton Blizzard, M.D.    DATE OF BIRTH:  February 25, 1956   DATE OF ADMISSION:  01/27/2006  DATE OF DISCHARGE:  01/28/2006                                 DISCHARGE SUMMARY   ADMISSION DIAGNOSES:  1.  Atypical chest pain.  2.  Hypertension.  3.  History of Hashimoto's.  4.  Leg pain.  5.  Gastroesophageal reflux disease.  6.  Asthma.   DISCHARGE DIAGNOSES:  1.  Atypical chest pain.  2.  Hypertension.  3.  History of Hashimoto's.  4.  Leg pain.  5.  Gastroesophageal reflux disease.  6.  Asthma.   CONSULTATIONS:  None.   PROCEDURE:  EKG.   HISTORY AND PHYSICAL:  See full H and P for complete history and physical.  In brief, this is a 55 year old female with a history of hypertension,  presenting to the emergency department with chest heaviness and feeling  bad since July 21st.  She had increased swelling per her history and said  it was unlike her previous asthma attacks.   REVIEW OF SYSTEMS:  Remarkable for nausea and dizziness.  Otherwise  negative.   PHYSICAL EXAMINATION:  Unremarkable except for trace nonpitting edema in  bilateral lower extremities.   HOSPITAL COURSE:  1.  Atypical chest pain.  Admission EKG was normal.  Cardiac markers were      negative x2, because of the low likelihood a third set was not done.      The patient was continued on aspirin 81 mg daily.  A chest x-ray that      was performed showed no pneumonia and the lung exam on admission was      negative.  She was sating 98% on room air in the emergency department      and was sating in the 90s on room throughout her stay.  A PE was      unlikely.  2.  Hypertension.  We continued the patient's hydrochlorothiazide.  Her      blood pressure was noted to be well controlled throughout the stay.  3.  Leg pain.   This is not new and is consistent and with her arthritis.      Also noted to be bilateral.  We continued to use Celebrex here.  4.  Gastroesophageal reflux disease.  Continue the patient's Protonix 20 mg.  5.  Asthma.  Continue the patient's Xopenex, and Singulair at home.  6.  History of Hashimoto's disease.  No changes.   DISCHARGE MEDICATIONS:  1.  Paxil 20 mg p.o. daily.  2.  Hydrochlorothiazide 12.5 mg one tablet p.o. daily.  3.  Oxybutynin 5 mg one pill p.o. t.i.d.  4.  Singulair 10 mg one pill p.o. nightly.  5.  Quinine 200 mg one pill p.r.n. nightly.  6.  Celebrex 200 mg one pill p.o. daily.  7.  Xopenex 45 mcg inhaled as needed for wheezing.  8.  Avonex 220 mcg inhaler twice a day.  9.  Premarin 0.625 mg p.o. daily.   CONDITION ON DISCHARGE:  Good, stable.   FOLLOWUP:  With Dr. Tressia Danas on February 03, 2006.  Keep the previous time  for this appointment.   ISSUES FOR FOLLOWUP:  None.  The patient to call for increasing chest pain,  shortness of breath, fevers, or other problems.           ______________________________  Norton Blizzard, M.D.     SH/MEDQ  D:  01/28/2006  T:  01/28/2006  Job:  [id number]   cc:   Sharin Grave, MD

## 2010-11-12 NOTE — Discharge Summary (Signed)
NAMEAMARISSA, KOERNER NO.:  [id number]   MEDICAL RECORD NO.:  [id number]                   PATIENT TYPE:   LOCATION:                                       FACILITY:   PHYSICIAN:  Sherin Quarry, MD                   DATE OF BIRTH:   DATE OF ADMISSION:  DATE OF DISCHARGE:                                 DISCHARGE SUMMARY   HISTORY OF PRESENT ILLNESS:  [name] White is a 55 year old lady who  presented to [facility] emergency room on May 15, 2002 with history of recurrent pain described as either low chest or mid  epigastric, radiating to the left arm for about the last six weeks. The pain  seemed to be worse after eating fatty foods. Because the patient has been  avoiding eating, she has lost about ten pounds. There has been no dyspnea on  exertion, orthopnea, or PND. No exertional chest pain. Risk factors, she  does not smoke. She denies use of drugs or alcohol. There is no history of  hypertension or diabetes mellitus.   FAMILY HISTORY:  Notable for coronary artery disease in the patient's mother  and aunt.   PHYSICAL EXAMINATION:  At the time of admission is as described by Dr.  Jonell Cluck.  HEENT: Within normal limits.  CHEST: Clear.  CARDIAC: Normal S1 and S2 without murmur, rub, or gallop.  ABDOMEN:  Benign. Normal bowel sounds. No masses, tenderness, or  organomegaly.  EXTREMITIES: Examination normal.  NEURO: Within normal limits   LABORATORY DATA:  CBC revealed hemoglobin of 12.8, C-met within normal  limits including normal liver profile. CPK and CK MB were negative. Troponin  was 0.01 times two.   DIAGNOSTIC IMPRESSION:  Chest x-ray showed mild bibasilar hypoaeration. The  patient underwent an abdominal ultrasound which showed a well visualized  gallbladder, which appeared to be normal and there was no abnormalities of  the biliary ducts or pancreas. EKG was obtained and was completely normal.   CONSULTATIONS:  Obtained from Dr. Elease Hashimoto from the Cardiology Service who  recommended that a Cardiolite stress be performed. This was done at [facility] on May 16, 2002. I have only a verbal report about this  study. The verbal report is that the patient tolerated the treadmill portion  of the test well. Her heart rate got to 178. No ischemic changes were  identified. Reportedly, the Cardiolite aspect of the test was normal as  well. I do not have these printed reports.   DISPOSITION:  As the patient was feeling well on May 16, 2002 she was  discharged.   DISCHARGE DIAGNOSES:  1. Recurrent chest pain, probably secondary to gastroesophageal reflux     disease.  2. Chronic arthritis.  3. Recurrent migraine headaches.    PLAN:  The patient was advised to continue Premarin. She was advised to take  Vioxx  as needed for arthritis as previously. She was also advised to take  Protonix 40 mg daily for treatment of her gastroesophageal reflux symptoms.  She already has a return appointment with Dr. Margarette Canada at [facility] and  will be keeping this appointment. The importance of regular physical  exercise was emphasized.                                               Sherin Quarry, MD    SY/MEDQ  D:  05/16/2002  T:  05/16/2002  Job:  [id number]   cc:   Lazaro Arms, M.D.  [address] [place], [place] [address]  Fax: [phone]   Vesta Mixer, M.D.  [address]  [place]  [place] [address]  Fax: [phone]

## 2010-11-12 NOTE — Consult Note (Signed)
NAMEMarland Kitchen  [name], White                       ACCOUNT NO.:  [id number]   MEDICAL RECORD NO.:  [id number]                   PATIENT TYPE:  INP   LOCATION:  0345                                 FACILITY:  [facility]   PHYSICIAN:  Vesta Mixer, M.D.              DATE OF BIRTH:  11/15/1955   DATE OF CONSULTATION:  05/15/2002  DATE OF DISCHARGE:                                   CONSULTATION   HISTORY OF PRESENT ILLNESS:  The patient is a 55 year old black female with  a history of nausea, vomiting, and abdominal pain.  She presented to [facility] with an episode of chest pain and epigastric pain.   The patient has had remote episodes of chest pain 20 years ago while she was  pregnant.  She did not have much problems past that time.  She has had  occasional episodes of nausea, vomiting, and indigestion like symptoms.  She  states that she is not able to tolerate fried or greasy foods at all.  Last  night she developed severe episodes of chest pain and epigastric pain.  This  was associated with some chest tightness and the inability to take a deep  breath.  She presented to [facility] for further evaluation.   MEDICATIONS:  1. Pepcid as needed.  2. Indomethacin as needed.  3. Vioxx 12.5 mg q.d. as needed.  4. Oxybutynin b.i.d.  5. Sudafed as needed.   ALLERGIES:  SEPTRA, CIPRO, ERYTHROMYCIN.   PAST MEDICAL HISTORY:  1. Arthritis.  2. Gastroesophageal reflux.   SOCIAL HISTORY:  The patient quit smoking a year ago.  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   REVIEW OF SYSTEMS:  Negative.   PHYSICAL EXAMINATION:  GENERAL:  She is a middle aged female in no acute  distress.  VITAL SIGNS:  Blood pressure 133/69, heart rate 84.  NECK:  2+ carotids.  She has no bruits.  There is no JVD, no thyromegaly.  LUNGS:  Clear to auscultation.  HEART:  Regular rate.  S1, S2.  Good __________.  No murmurs, gallops, or  rubs.  ABDOMEN:  Good bowel sounds and is nontender.  EXTREMITIES:  She has no clubbing, cyanosis, edema.  NEUROLOGIC:  Nonfocal.   LABORATORIES:  Her EKG reveals normal sinus rhythm.  She has no ST or T-wave  changes.   The cardiac enzymes are negative x3.   IMPRESSION:  The patient presents with some episodes of atypical chest pain.  I suspect that these pains are gastrointestinal in origin.  I do agree that  a stress Cardiolite study is indicated.  We will transfer her to [facility]  tomorrow morning and perform a stress Cardiolite study.  She will be able to  go home from the hospital following the stress test if it is negative if all  of her other medical problems are stable.  Vesta Mixer, M.D.    PJN/MEDQ  D:  05/15/2002  T:  05/15/2002  Job:  [id number]   cc:   Sherin Quarry, MD  [address]  [place]  [place] [address]  Fax: [phone]   Willis Modena, M.D.

## 2010-11-12 NOTE — H&P (Signed)
[name] White             ACCOUNT NO.:  [id number]   MEDICAL RECORD NO.:  [id number]          PATIENT TYPE:  OBV   LOCATION:  4715                         FACILITY:  [facility]   PHYSICIAN:  Pearlean Brownie, M.D.DATE OF BIRTH:  02-11-1956   DATE OF ADMISSION:  01/28/2006  DATE OF DISCHARGE:                                HISTORY & PHYSICAL   CHIEF COMPLAINT:  Chest pain and heaviness.   HISTORY OF PRESENT ILLNESS:  This is a 55 year old female who presents to  the emergency room with chest heaviness and pain since about 10 a.m. this  morning; however, she states she has been feeling bad since January 14, 2006.  She has noticed increased fluid retention since then, most predominantly in  her legs.  The chest heaviness that she has been feeling she describes as  different from a normal asthma attack/flare-up.  It has gradually increased  in pain as the day progressed and it feels like someone is sitting on her  chest.  The pressure is mostly in the middle of her chest.  This is the  first time this patient has had this type of chest pain.  She feels better  now that she is in the ER, but still has some heaviness in the mid-sternum.  The patient endorses nausea and lightheadedness at times.  The patient is  followed by Dr. Tressia Danas with the [facility].  She  is also followed by Dr. 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   While in the emergency room, the patient was given aspirin and  nitroglycerin, but the patient did not know if the nitroglycerin helped her.  Overall, she felt better; however, she said nitroglycerin mostly gave her  headache and she still had the chest heaviness.   PAST MEDICAL HISTORY:  1.  Depressive disorder, no other symptoms.  2.  Obesity.  3.  Hypertension.  4.  Restless leg syndrome.  5.  Dysuria/urinary incontinence.  6.  Asthma.  7.  Rhinitis.  8.  GERD.  9.  History of Hashimoto's thyroiditis.   SURGICAL HISTORY:  1.  The patient has had a hysterectomy in 1992.  2.  Hemorrhoidectomy in 1984.  3.  Benign breast cyst removal in 1982.  4.  She had a right thyroid lobectomy in 2005.  5.  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   FAMILY HISTORY:  Daughter with thyroid cancer and asthma.  Mother has asthma  and diabetes.  Son has asthma.  Lupus runs in the family.   SOCIAL HISTORY:  The patient is divorced, but does live with her partner.  She is a Consulting civil engineer at [employer]  She will be graduating soon.  She  is also a Systems analyst.  The patient is a former smoker who smoked 1 pack  over 3-4 days for about 16 years, but she quit in 2002.  The patient is not  smoking currently and denies alcohol or illicit drug use.   HOME MEDICATIONS:  1.  Albuterol nebulizer p.r.n.  2.  Asmanex 220 mcg inhaled b.i.d.  3.  Atrovent MDI nebulizer q.6 h. p.r.n. wheezing.  4.  Hydrochlorothiazide 12.5 mg p.o. daily.  5.  Nasonex one spray in nostril.  6.  Paroxetine 20 mg p.o. daily.  7.  Premarin 0.625 mg p.o. daily.  8.  Prilosec over the counter, although the patient states she is not      currently taking this medication.  9.  Quinine sulfate 200 mg p.o. nightly.  10. Singulair 10 mg p.o. nightly.  11. Xopenex metered-dose inhaler.  12. Celebrex 200 mg.   Those are the patient's home medications that she is currently taking.   ALLERGIES:  ERYTHROMYCIN, CIPRO and SEPTRA all cause hives.  CHOCOLATE and  CAFFEINE cause migraines.   REVIEW OF SYSTEMS:  In addition to previously mentioned shortness of breath  and chest pain in HPI, the patient endorses nausea.  GENITOURINARY:  No  complaints.  SKIN:  No complaints.  EXTREMITIES:  She complains of leg pain  from the knees on down.  She also complains of sweating; however, this is  typical for her going through postmenopausal state.   PHYSICAL EXAMINATION:  VITAL SIGNS:  I  failed to write these down; however,  in the room, the patient was normotensive at about 104/70.  She was  saturating at 92% oxygen on room air while we were in the room.  She was  regular rate.  I will update these vitals in the chart, once the patient is  in a room.  GENERAL APPEARANCE:  Not in acute distress.  Looks well.  The patient is  smiling and appears to be happy.  MENTAL STATUS:  Alert and oriented x3.  HEENT:  Head atraumatic.  Eyes:  Pupils are equal, round and reactive to  light and accommodation.  Extraocular muscles intact.  Ears are not  examined.  Mouth and throat:  No erythema, no exudate.  Oral mucosa moist.  LUNGS:  Clear to auscultation bilaterally.  No wheezes, rhonchi or crackles.  HEART:  Regular rate and rhythm.  No rubs, gallops or murmurs.  ABDOMEN:  Soft and nontender.  Positive bowel sounds.  EXTREMITIES:  Very tender to palpation over the anterior legs from the knees  down to her feet bilaterally.  The posterior lower extremities are also  tender, but not quite as tender as the anterior.  There was trace non-  pitting edema of bilateral lower extremities.  Pulses 2+ and equal.  NEUROLOGICAL:  The patient was neurologically intact with no focal deficits.  Cranial nerves II-XII intact.  The patient's strength was 5/5, upper and  lower extremities bilaterally.  Balance and gait we did not assess.  SKIN:  Moist and otherwise within normal limits.   LABORATORY DATA AND TESTS:  EKG was within normal limits, unremarkable.   CBC:  WBC 8, hemoglobin 12.6, hematocrit 37.6, platelets 482,000.  Chem-7:  Sodium 136, potassium 3.8, chloride 106, bicarb 27, BUN 9, creatinine 0.7,  glucose 90.  Initial point-of-care cardiac enzymes:  Myoglobin 39.2, CK-MB  1, troponin less than 0.05; these are all within normal limits.   ASSESSMENT AND PLAN:  This is a 55 year old female admitted for atypical  chest pain.  1.  Atypical chest pain.  We must rule out myocardial  infarction, although      this patient is most likely not having chest pain that is cardiac in      nature.  We will order  cardiac enzymes x3.  Initial EKG is normal and      there is no need to repeat it at this point.  Begin aspirin 81 mg p.o.      daily.  No need to consult Cardiology at this point.  2.  Chest heaviness.  Differential includes pneumonia, for which she is a      low risk, gastroesophageal reflux disease, angina versus myocardial      infarction and asthma flare-up.  Chest x-ray is normal with no      infiltrate or any acute process.  Clinically, this patient appears      healthy.  Her monitored oxygen saturations are currently 98% on room      air.  We are going to continue her home medications as previously stated      in medication section.  The patient does not need oxygen and we will      monitor this chest heaviness; I am assuming it will resolve on its own      and most likely could be a gastroesophageal reflux disease flare-up      secondary to the patient no longer taking her reflux medication,      Prilosec.  3.  History of Hashimoto's thyroiditis, stable.  We are going to check her      TSH, T3 and T4 in the morning.  4.  Gastroesophageal reflux disease.  Begin Protonix 20 mg p.o. daily.  5.  Leg pain.  Continue Celebrex.  The patient will follow up with leg pain      in the clinic.  6.  Asthma.  Stated as above could be attributed to a chest heaviness,      although no wheezes are appreciated on exam.  The patient will take her      home medications and she is stable.  7.  Hypertension.  Continue hydrochlorothiazide 12.5 mg p.o. daily.  The      patient is stable.  There is no need to add any medications.  8.  Regular diet.     ______________________________  Johney Maine, M.D.    ______________________________  Pearlean Brownie, M.D.    JT/MEDQ  D:  01/28/2006  T:  01/28/2006  Job:  [id number]

## 2010-11-12 NOTE — Op Note (Signed)
NAMESAINA, WAAGE             ACCOUNT NO.:  [id number]   MEDICAL RECORD NO.:  [id number]          PATIENT TYPE:  AMB   LOCATION:  DSC                          FACILITY:  [facility]   PHYSICIAN:  Onalee Hua L. Annalee Genta, M.D.DATE OF BIRTH:  1956/06/09   DATE OF PROCEDURE:  10/10/2005  DATE OF DISCHARGE:                                 OPERATIVE REPORT   PRE-AND-POSTOPERATIVE DIAGNOSES AND INDICATIONS FOR SURGERY:  1.  Chronic sinusitis.  2.  Large ethmoid osteoma  3.  Reactive airway disease.   SURGICAL PROCEDURE:  1.  Bilateral endoscopic sinus surgery with computer-assisted navigation      (Insta Trak consisting of bilateral total ethmoidectomy, bilateral      maxillary antrostomy with removal of left maxillary sinus disease and      bilateral nasal frontal recess exploration).  2.  Endoscopic excision of a right ethmoid osteoma,  3.  Bilateral inferior turbinate reduction.   SURGEON:  Kinnie Scales. Annalee Genta, M.D.   ANESTHESIA:  General.   COMPLICATIONS:  None.   SPECIMEN:  To pathology.   APPROXIMATE BLOOD LOSS:  150 mL no packing was placed.  The patient was  transferred from the operating room to the recovery room in stable  condition.   BRIEF HISTORY:  Ms. Leming is a 55 year old black female who is referred  by her allergist, Colonel Bald, for evaluation of chronic sinusitis.  The  patient has had a history of significant allergic rhinitis and progressive  reactive airway disease which is exacerbated by low-grade underlying  sinusitis.  The patient has had multiple courses of antibiotic therapy with  limited improvement, and continues to have significant problems with  worsening asthma.  Given her history and examination, a CT scan was  obtained; and she is found to have low-grade diffuse sinus disease involving  the ethmoid and maxillary sinuses bilaterally with obstruction of the  ostiomeatal complex.  An incidental finding revealed a large bony mass in  the right  ethmoid sinus measuring approximately 1 x 3 cm lobulated and  consistent with an ethmoid osteoma.  The osteoma was contained primarily  within the right ethmoid sinus without impingement on the skull base, orbit,  or nasal frontal recess.  Given the patient's history, physical examination,  and findings including CT as outlined above.  I recommended that we consider  her for bilateral endoscopic sinus surgery.  This would include bilateral  total ethmoidectomy, maxillary antrostomy, and nasal frontal recess  exploration.  I also recommended removal of the right ethmoid osteoma at the  same time to reduce the risk of later problems as the osteoma continued to  grow.  The risks, benefits, and possible complications of the surgical  procedure were discussed in detail.  We also discussed an inferior turbinate  reduction to improve nasal air flow.  The patient understood and concurred  with our plan for surgery which was scheduled as above.  Prior to surgery,  clearance was obtained from her medical physician as well as from Dr. Willa Rough  her asthma specialist; and the patient understood the risks and benefits of  procedure and agreed  with our plan.   SURGICAL PROCEDURE:  The patient brought to the operating room on October 10, 2005 and was placed in supine position on the operating table.  General  endotracheal anesthesia was established without difficulty.  When the  patient had been adequately anesthetized, the Insta Trak headgear was  applied and anatomic and surgical landmarks were identified and confirmed  throughout the nasal passageway.  The patient was prepped and draped in a  sterile fashion and positioned on the operating table.  Her nose was  injected with a total of 9 mL of 1% lidocaine 1:100,000 solution of  epinephrine which was injected in a submucosal fashion along the lateral  nasal wall, uncinate process, middle turbinate, and on the right-hand side  along the inferior aspect of  the bony osteoma.  The patient's nose was then  packed with Afrin soaked cottonoid pledgets which were left in place for  approximately 10 minutes to allow for vasoconstriction; and the surgical  procedure was begun with bilateral 0-degree endoscopy.  The patient was  found to have significantly enlarged inferior turbinates with airway  obstruction.  The middle meatus appeared patent bilaterally; and on the  right-hand side no evidence of osteoma or significant anatomic abnormality.   Beginning on the left-hand side endoscopic sinus surgery was begun.  The  middle turbinate was gently medialized.  The uncinate process was reflected  medially and resected in its entirety with back cutting forceps.  Using a 0-  degree endoscope the ethmoid bulla was resected and dissection was carried  from anterior-to-posterior along the floor of the ethmoid sinus removing  diseased tissue and bony septations.  The superior-posterior aspect of the  ethmoid sinus was then identified.  This was confirmed with the Insta Trak;  and dissection was carried out along the roof of the ethmoid from posterior-  to-anterior using a 45-degree telescope and a curved microdebrider.  The  Joen Laura Trak was used to identify the sinus anatomy and nasal frontal recess  was identified.  This was obstructed with the underlying ethmoid air cells  and mucosal disease which was resected with a curved microdebrider creating  a widely patent nasal frontal recess.   Attention was then turned to the lateral nasal wall, where a natural ostia  of the maxillary sinus identified.  It was enlarged in a posterior-and-  inferior direction; and a large left maxillary sinus polyp was resected  without difficulty.  There was no active bleeding and no evidence of further  infection.   Attention was then turned to the patient's right-hand side, where endoscopic sinus surgery was undertaken.  The middle turbinate was gently medialized  and the  uncinate process reflected anteriorly.  This was resected with a  through cutting forceps.  The ethmoid bulla was identified; and this was  removed and immediately posterior to the ethmoid bulla was a large bony mass  consistent with the osteoma visualized on CT scan.  The osteoma was resected  in its entirety and this is dictated as a separate paragraph below.  With  the osteoma completely removed, the endoscopic sinus surgery was undertaken  and dissection was carried out from anterior-to-posterior removing bony  septations and diseased mucosa.  The roof of the ethmoid sinus was  identified and dissection was carried from posterior-to-anterior removing  diseased mucosa and obstruction.  The natural ostium of the nasal frontal  recess was identified and this was enlarged creating a patent nasal frontal  recess.  Attention was  then turned to the lateral nasal wall where the  natural ostia of the maxillary sinus was identified.  It was not occluded  and was left intact; and a small amount of posterior mucosal tissue was  resected to create a patent right maxillary ostium.   The right ethmoid ostium was addressed under direct visualization.  The  entire anterior, posterior, and medial dimension of the osteoma was palpated  using the Insta Trak; using a Xomed sinus drill, the osteoma was removed.  Using a cutting bur and removing approximately 75% of the osteoma from below  leaving a bony margin along the superior and medial aspect.  As this bone  was removed, the ostium was gently mobilized and its attachment to the  lamina of the right orbit was identified.  This was transected and the  entire residual ostium was then removed as one specimen, sent to pathology  for gross and microscopic evaluation.  This left the ethmoid region widely  patent.  The skull base and lateral orbit were thoroughly inspected.  There  was no evidence of breach, spinal fluid leak, bleeding, or hematoma and the   lamina propria appeared to be intact.  The ostium was removed its entirety;  and there was no residual disease noted.   Attention was then turned to the inferior turbinates where bilateral  inferior turbinate reduction was performed using a combination of a straight  inferior turbinate reduction blade, an anterior stab incision was created in  the right inferior turbinate.  This was carried through the mucosa into the  underlying submucosa and a submucosal flap was elevated.  The debrider was  used to debride turbinate bone; and some of the soft tissue within the  submucosal space.  The turbinate was then outfractured creating a more  patent nasal cavity.   Attention then turned to the left-hand side where a similar procedure was  carried out.  We also used bipolar intramural cautery. Two passes were made  in a submucosal fashion; and in the left inferior turbinate, to further shrink the mucosa.  The turbinate was then outfractured and the surgical  procedure was completed.   Nasal cavity, nasopharynx, oral cavity, and oropharynx were then irrigated  and inspected for surgical debris which was resected.  There was no active  bleeding; and the sinonasal cavity was widely patent.  The oral gastric tube  was passed and the stomach contents were aspirated, oral cavity, and  oropharynx were suctioned.  The patient was then awakened from her  anesthetic, extubated, and transferred from the operating room to he  recovery in stable condition.  No complications.  The blood loss was  approximately 150 mL.           ______________________________  Kinnie Scales. Annalee Genta, M.D.     DLS/MEDQ  D:  16/03/9603  T:  10/10/2005  Job:  [id number]

## 2010-12-13 ENCOUNTER — Other Ambulatory Visit: Payer: Self-pay | Admitting: Family Medicine

## 2010-12-13 DIAGNOSIS — Z1231 Encounter for screening mammogram for malignant neoplasm of breast: Secondary | ICD-10-CM

## 2010-12-20 ENCOUNTER — Ambulatory Visit: Payer: Medicare Other | Admitting: Family Medicine

## 2010-12-21 ENCOUNTER — Ambulatory Visit: Payer: Medicare Other | Admitting: Family Medicine

## 2010-12-22 ENCOUNTER — Ambulatory Visit (HOSPITAL_COMMUNITY): Payer: Medicare Other

## 2011-03-24 LAB — POCT I-STAT, CHEM 8
BUN: 14
Calcium, Ion: 1.09 — ABNORMAL LOW
Creatinine, Ser: 1
TCO2: 28

## 2011-03-24 LAB — DIFFERENTIAL
Eosinophils Absolute: 0.3
Lymphs Abs: 2
Monocytes Relative: 6
Neutro Abs: 4.6
Neutrophils Relative %: 63

## 2011-03-24 LAB — URINE MICROSCOPIC-ADD ON

## 2011-03-24 LAB — PROTIME-INR: INR: 0.9

## 2011-03-24 LAB — POCT PREGNANCY, URINE: Operator id: 264031

## 2011-03-24 LAB — CBC
Hemoglobin: 12.7
MCV: 91.9
RBC: 4.13
WBC: 7.3

## 2011-03-24 LAB — URINALYSIS, ROUTINE W REFLEX MICROSCOPIC
Glucose, UA: NEGATIVE
Nitrite: NEGATIVE
Specific Gravity, Urine: 1.011
pH: 7

## 2011-03-24 LAB — TSH: TSH: 0.998 (ref 0.350–4.500)

## 2011-03-24 LAB — URINE CULTURE

## 2011-03-24 LAB — POCT CARDIAC MARKERS: Operator id: 264031

## 2011-03-24 LAB — APTT: aPTT: 24

## 2011-04-21 ENCOUNTER — Encounter: Payer: Self-pay | Admitting: Family Medicine

## 2011-04-21 ENCOUNTER — Ambulatory Visit (INDEPENDENT_AMBULATORY_CARE_PROVIDER_SITE_OTHER): Payer: Medicare Other | Admitting: Family Medicine

## 2011-04-21 VITALS — BP 116/87 | HR 82 | Temp 98.7°F | Ht 67.6 in | Wt 284.0 lb

## 2011-04-21 DIAGNOSIS — R232 Flushing: Secondary | ICD-10-CM

## 2011-04-21 DIAGNOSIS — R5383 Other fatigue: Secondary | ICD-10-CM | POA: Insufficient documentation

## 2011-04-21 DIAGNOSIS — R5381 Other malaise: Secondary | ICD-10-CM

## 2011-04-21 DIAGNOSIS — N951 Menopausal and female climacteric states: Secondary | ICD-10-CM

## 2011-04-21 MED ORDER — ESTRADIOL 0.025 MG/24HR TD PTWK: 1.0000 | MEDICATED_PATCH | TRANSDERMAL | Status: DC

## 2011-04-21 NOTE — Patient Instructions (Signed)
It was great to see you today.  Continue to exercise as much as possible.   No Labs needed today.  I will see you back in one year or sooner as needed.

## 2011-04-22 DIAGNOSIS — R232 Flushing: Secondary | ICD-10-CM | POA: Insufficient documentation

## 2011-04-22 NOTE — Progress Notes (Signed)
  Subjective:    Patient ID: [name] White, female    DOB: [birth year], 55 y.o.   MRN: [id number]  HPI 1. Hot flashes Having them periodically since menopause. She has no fever, no weight loss, no chills. No family history of breast cancer or uterine cancer. These are causing her a lot of discomfort and embarrassment.  2. Fatigue. Started two weeks ago after viral infection that she said was flu like. No fevers, chills, night sweats, weight loss, lymph swelling.  Review of Systems See HPI    Objective:   Physical Exam Lungs:  Normal respiratory effort, chest expands symmetrically. Lungs are clear to auscultation, no crackles or wheezes. Abdomen: soft and non-tender without masses, organomegaly or hernias noted.  No guarding or rebound Filed Vitals:   04/21/11 1527  Height: 5' 7.6" (1.717 m)  Weight: 284 lb (128.822 kg)   Filed Vitals:   04/21/11 1527  BP: 116/87  Pulse: 82  Temp: 98.7 F (37.1 C)  TempSrc: Oral  Height: 5' 7.6" (1.717 m)  Weight: 284 lb (128.822 kg)      Assessment & Plan:

## 2011-04-22 NOTE — Assessment & Plan Note (Signed)
Fatigue likely secondary to recent viral infection 2 weeks ago. She is steadily improving. TSH was normal in the past. Will follow.

## 2011-04-22 NOTE — Progress Notes (Signed)
Short term [name] White makes sense but why give her a years Rx for patches? Thanks LC

## 2011-04-22 NOTE — Assessment & Plan Note (Signed)
Menopausal hot flashes. No history of breast cancer, had hysterectomy. Will use short term estrogen patch.

## 2011-05-27 ENCOUNTER — Emergency Department (HOSPITAL_COMMUNITY): Payer: Medicare Other

## 2011-05-27 ENCOUNTER — Encounter (HOSPITAL_COMMUNITY): Payer: Self-pay | Admitting: Emergency Medicine

## 2011-05-27 ENCOUNTER — Emergency Department (HOSPITAL_COMMUNITY)
Admission: EM | Admit: 2011-05-27 | Discharge: 2011-05-27 | Disposition: A | Discharge status: Home / Self Care | Payer: Medicare Other | Attending: Emergency Medicine | Admitting: Emergency Medicine

## 2011-05-27 DIAGNOSIS — E079 Disorder of thyroid, unspecified: Secondary | ICD-10-CM | POA: Insufficient documentation

## 2011-05-27 DIAGNOSIS — R0789 Other chest pain: Secondary | ICD-10-CM | POA: Insufficient documentation

## 2011-05-27 DIAGNOSIS — M1990 Unspecified osteoarthritis, unspecified site: Secondary | ICD-10-CM | POA: Insufficient documentation

## 2011-05-27 DIAGNOSIS — J45901 Unspecified asthma with (acute) exacerbation: Secondary | ICD-10-CM | POA: Insufficient documentation

## 2011-05-27 DIAGNOSIS — R059 Cough, unspecified: Secondary | ICD-10-CM | POA: Insufficient documentation

## 2011-05-27 DIAGNOSIS — K219 Gastro-esophageal reflux disease without esophagitis: Secondary | ICD-10-CM | POA: Insufficient documentation

## 2011-05-27 DIAGNOSIS — I10 Essential (primary) hypertension: Secondary | ICD-10-CM | POA: Insufficient documentation

## 2011-05-27 DIAGNOSIS — R05 Cough: Secondary | ICD-10-CM | POA: Insufficient documentation

## 2011-05-27 HISTORY — DX: Gastro-esophageal reflux disease without esophagitis: K21.9

## 2011-05-27 HISTORY — DX: Unspecified osteoarthritis, unspecified site: M19.90

## 2011-05-27 HISTORY — DX: Essential (primary) hypertension: I10

## 2011-05-27 MED ORDER — ALBUTEROL SULFATE (5 MG/ML) 0.5% IN NEBU
INHALATION_SOLUTION | RESPIRATORY_TRACT | Status: AC
  Administered 2011-05-27: 2.5 mg
  Filled 2011-05-27: qty 0.5

## 2011-05-27 MED ORDER — PREDNISONE 20 MG PO TABS: 20.0000 mg | ORAL_TABLET | Freq: Every day | ORAL | Status: AC

## 2011-05-27 MED ORDER — ALBUTEROL SULFATE (5 MG/ML) 0.5% IN NEBU
INHALATION_SOLUTION | RESPIRATORY_TRACT | Status: AC
  Filled 2011-05-27: qty 2

## 2011-05-27 MED ORDER — METHYLPREDNISOLONE SODIUM SUCC 125 MG IJ SOLR
INTRAMUSCULAR | Status: AC
  Filled 2011-05-27: qty 2

## 2011-05-27 MED ORDER — IPRATROPIUM BROMIDE 0.02 % IN SOLN
RESPIRATORY_TRACT | Status: AC
  Filled 2011-05-27: qty 2.5

## 2011-05-27 MED ORDER — IPRATROPIUM BROMIDE 0.02 % IN SOLN
RESPIRATORY_TRACT | Status: AC
  Administered 2011-05-27: 0.5 mg
  Filled 2011-05-27: qty 2.5

## 2011-05-27 NOTE — ED Notes (Signed)
JYN:[id number]<AO> Expected date:05/27/11<BR> Expected time: 7:31 AM<BR> Means of arrival:Ambulance<BR> Comments:<BR> SOB

## 2011-05-27 NOTE — ED Provider Notes (Signed)
History     CSN: [id number] Arrival date & time: 05/27/2011  7:57 AM   Chief Complaint  Patient presents with  . Asthma    Pt to ER with asthma exacerbation. Pt states she woke up this am with extreme SOB. Pt states she attempted to take her inhaler but was not able to do so. Pt with audible wheezing upon her arrival. Pt with cough that started prior to her arrival.     HPI Pt was seen at 0830.  Per pt and EMS, c/o gradual onset and improvement in constant cough and "wheezing" that began PTA while she was getting ready at work.  Describes her symptoms as "my asthma."  States it "was too late to use my inhaler" so she called 911.  Pt received neb and steroid en route by EMS with relief of symptoms.  Denies CP/palpitations, no fevers, no abd pain, no back pain, no fevers, no rash.     Past Medical History  Diagnosis Date  . Asthma   . Hypertension   . Thyroid disease   . Osteoarthritis   . GERD (gastroesophageal reflux disease)     Past Surgical History  Procedure Date  . Abdominal hysterectomy     History  Substance Use Topics  . Smoking status: Never Smoker   . Smokeless tobacco: Not on file  . Alcohol Use: No    Review of Systems ROS: Statement: All systems negative except as marked or noted in the HPI; Constitutional: Negative for fever and chills. ; ; Eyes: Negative for eye pain, redness and discharge. ; ; ENMT: Negative for ear pain, hoarseness, nasal congestion, sinus pressure and sore throat. ; ; Cardiovascular: Negative for chest pain, palpitations, diaphoresis, and peripheral edema. ; ; Respiratory: Positive for cough, wheezing; and negative for stridor. ; ; Gastrointestinal: Negative for nausea, vomiting, diarrhea and abdominal pain, blood in stool, hematemesis, jaundice and rectal bleeding. . ; ; Genitourinary: Negative for dysuria, flank pain and hematuria. ; ; Musculoskeletal: Negative for back pain and neck pain. Negative for swelling and trauma.; ; Skin: Negative  for pruritus, rash, abrasions, blisters, bruising and skin lesion.; ; Neuro: Negative for headache, lightheadedness and neck stiffness. Negative for weakness, altered level of consciousness , altered mental status, extremity weakness, paresthesias, involuntary movement, seizure and syncope.     Allergies  Ciprofloxacin; Vicodin; Erythromycin; and Sulfamethoxazole w/trimethoprim  Home Medications   Current Outpatient Rx  Name Route Sig Dispense Refill  . ALBUTEROL SULFATE HFA 108 (90 BASE) MCG/ACT IN AERS Inhalation Inhale 2 puffs into the lungs every 6 (six) hours as needed. For shortness of breath    . ALBUTEROL SULFATE (2.5 MG/3ML) 0.083% IN NEBU Nebulization Take 2.5 mg by nebulization every 6 (six) hours as needed. For shortness of breath    . ASPIRIN-ACETAMINOPHEN-CAFFEINE 250-250-65 MG PO TABS Oral Take 1 tablet by mouth every 6 (six) hours as needed. For pain     . AZELASTINE HCL 0.05 % OP SOLN Both Eyes Place 1 drop into both eyes 2 (two) times daily as needed. For dry/irritated eyes     . BUDESONIDE-FORMOTEROL FUMARATE 160-4.5 MCG/ACT IN AERO Inhalation Inhale 2 puffs into the lungs 2 (two) times daily.      Marland Kitchen CETIRIZINE HCL 10 MG PO TABS Oral Take 10 mg by mouth daily.      Marland Kitchen DICLOFENAC SODIUM 1 % TD GEL Topical Apply 1 application topically 2 (two) times daily as needed. On knees for pain/inflamation     .  DULOXETINE HCL 60 MG PO CPEP Oral Take 60 mg by mouth daily.      Marland Kitchen ESTRADIOL 0.025 MG/24HR TD PTWK Transdermal Place 1 patch onto the skin every 7 (seven) days. On thursdays     . HYDROXYZINE HCL 50 MG PO TABS Oral Take 50 mg by mouth daily.     Marland Kitchen LISINOPRIL-HYDROCHLOROTHIAZIDE 20-25 MG PO TABS Oral Take 1 tablet by mouth daily. 30 tablet 11  . OLOPATADINE HCL 0.6 % NA SOLN Nasal Place 1 puff into the nose 2 (two) times daily.      Marland Kitchen OMEPRAZOLE 20 MG PO CPDR Oral Take 20 mg by mouth daily.      . TRAZODONE HCL 50 MG PO TABS Oral Take 50 mg by mouth at bedtime.        BP 97/45   Pulse 122  Temp(Src) 98.5 F (36.9 C) (Oral)  Resp 26  SpO2 93% BP 122/71  Pulse 106  Temp(Src) 98.5 F (36.9 C) (Oral)  Resp 26  SpO2 97%   Physical Exam 0835: Physical examination:  Nursing notes reviewed; Vital signs and O2 SAT reviewed;  Constitutional: Well developed, Well nourished, Well hydrated, In no acute distress; Head:  Normocephalic, atraumatic; Eyes: EOMI, PERRL, No scleral icterus; ENMT: Mouth and pharynx normal, Mucous membranes moist; Neck: Supple, Full range of motion, No lymphadenopathy; Cardiovascular: Tachycardic rate and regular rhythm, No murmur, rub, or gallop; Respiratory: Breath sounds clear & equal bilaterally, No rales, rhonchi, wheezes, Speaking full sentences with ease. Normal respiratory effort/excursion; Chest: Nontender, Movement normal; Abdomen: Soft, Nontender, Nondistended, Normal bowel sounds;  Extremities: Pulses normal, No tenderness, No edema, No calf edema or asymmetry.; Neuro: AA&Ox3, Major CN grossly intact.  No gross focal motor or sensory deficits in extremities.; Skin: Color normal, Warm, Dry, no rash.   ED Course  Procedures   0840:  States she feels "so much better now" after albuterol 10mg /atrovent 1mg  and IV solumedrol 125mg .  Will continue to monitor.    MDM  MDM Reviewed: nursing note and vitals Interpretation: x-ray   Dg Chest 2 View  05/27/2011  *RADIOLOGY REPORT*  Clinical Data: Chest tightness, history of asthma, former smoker  CHEST - 2 VIEW  Comparison: Chest x-ray of 12/31/2007  Findings: No active infiltrate or effusion is seen.  Mediastinal contours are stable.  The heart is within normal limits in size. Mild degenerative changes present in the thoracic spine. Surgical clips are noted in the region of the right thyroid gland.  IMPRESSION: No active lung disease.  Original Report Authenticated By: Juline Patch, M.D.      9:38 AM:   Pt states she "feels fine now" and wants to leave. States she has enough MDI and neb  soln.  Will rx prednisone.  Dx testing d/w pt.  Questions answered.  Verb understanding, agreeable to d/c home with outpt f/u.        Tamm Allison Quarry, DO 05/27/11 2042

## 2011-05-27 NOTE — ED Notes (Signed)
Chief Complaint  Patient presents with  . Asthma    Pt to ER with asthma exacerbation. Pt states she woke up this am with extreme SOB. Pt states she attempted to take her inhaler but was not able to do so. Pt with audible wheezing upon her arrival. Pt with cough that started prior to her arrival.   Pt received Albuterol 10mg , Atrovent 1 mg, Solumedrol 125mg  enroute to facility. Pt states she feels better, but does have some tightness in her chest

## 2011-05-27 NOTE — ED Notes (Signed)
Pt given discharge instructions and verbalizes understanding  

## 2011-05-30 ENCOUNTER — Ambulatory Visit: Payer: Medicare Other | Admitting: Family Medicine

## 2011-08-10 ENCOUNTER — Ambulatory Visit: Payer: Medicare Other

## 2011-08-10 ENCOUNTER — Ambulatory Visit (INDEPENDENT_AMBULATORY_CARE_PROVIDER_SITE_OTHER): Payer: Medicare Other | Admitting: Family Medicine

## 2011-08-10 ENCOUNTER — Encounter: Payer: Self-pay | Admitting: Family Medicine

## 2011-08-10 VITALS — BP 117/83 | HR 96 | Temp 98.6°F | Ht 67.0 in | Wt 287.0 lb

## 2011-08-10 DIAGNOSIS — J069 Acute upper respiratory infection, unspecified: Secondary | ICD-10-CM | POA: Insufficient documentation

## 2011-08-10 MED ORDER — GUAIFENESIN-CODEINE 100-10 MG/5ML PO SYRP: 5.0000 mL | ORAL_SOLUTION | Freq: Three times a day (TID) | ORAL | Status: AC | PRN

## 2011-08-10 NOTE — Progress Notes (Signed)
[name] White is a 55 y.o. female who presents to [facility] today for cough congestion nasal discharge since Saturday. However on February 2 had cough and wheeze which was treated by an allergist with prednisone for 3 days. She no longer is wheezing but does continue to have bothersome nighttime cough. Additionally she has a painful sore throat but no fever. She is eating and drinking normally. She is trying over-the-counter cold medicines have helped only a little.  No chest pains trouble breathing vomiting diarrhea.   PMH reviewed. Significant for asthma ROS as above otherwise neg Medications reviewed. Current Outpatient Prescriptions  Medication Sig Dispense Refill  . albuterol (PROVENTIL HFA;VENTOLIN HFA) 108 (90 BASE) MCG/ACT inhaler Inhale 2 puffs into the lungs every 6 (six) hours as needed. For shortness of breath      . albuterol (PROVENTIL) (2.5 MG/3ML) 0.083% nebulizer solution Take 2.5 mg by nebulization every 6 (six) hours as needed. For shortness of breath      . aspirin-acetaminophen-caffeine (BAYER MIGRAINE) 250-250-65 MG per tablet Take 1 tablet by mouth every 6 (six) hours as needed. For pain       . azelastine (OPTIVAR) 0.05 % ophthalmic solution Place 1 drop into both eyes 2 (two) times daily as needed. For dry/irritated eyes       . budesonide-formoterol (SYMBICORT) 160-4.5 MCG/ACT inhaler Inhale 2 puffs into the lungs 2 (two) times daily.        . cetirizine (ZYRTEC) 10 MG tablet Take 10 mg by mouth daily.        . diclofenac sodium (VOLTAREN) 1 % GEL Apply 1 application topically 2 (two) times daily as needed. On knees for pain/inflamation       . DULoxetine (CYMBALTA) 60 MG capsule Take 60 mg by mouth daily.        Marland Kitchen estradiol (CLIMARA - DOSED IN MG/24 HR) 0.025 mg/24hr Place 1 patch onto the skin every 7 (seven) days. On thursdays       . hydrOXYzine (ATARAX/VISTARIL) 50 MG tablet Take 50 mg by mouth daily.       Marland Kitchen lisinopril-hydrochlorothiazide (PRINZIDE,ZESTORETIC) 20-25 MG  per tablet Take 1 tablet by mouth daily.  30 tablet  11  . Olopatadine HCl 0.6 % SOLN Place 1 puff into the nose 2 (two) times daily.        Marland Kitchen omeprazole (PRILOSEC) 20 MG capsule Take 20 mg by mouth daily.        . traZODone (DESYREL) 50 MG tablet Take 50 mg by mouth at bedtime.        Marland Kitchen guaiFENesin-codeine (ROBITUSSIN AC) 100-10 MG/5ML syrup Take 5 mLs by mouth 3 (three) times daily as needed for cough.  120 mL  0    Exam:  BP 117/83  Pulse 96  Temp(Src) 98.6 F (37 C) (Oral)  Ht 5\' 7"  (1.702 m)  Wt 287 lb (130.182 kg)  BMI 44.95 kg/m2 Gen: Well NAD HEENT: EOMI,  MMM nasal discharge and congestion normal tympanic membranes bilaterally. Posterior pharynx is difficult to visualize but erythematous without exudate Lungs: CTABL Nl WOB Heart: RRR no MRG Abd: NABS, NT, ND Exts: Non edematous BL  LE, warm and well perfused.

## 2011-08-10 NOTE — Patient Instructions (Signed)
Thank you for coming in today. I think you have a virus.  Take the cough medicine as needed at night. It may cause mild nausea or itching. You have to weigh the benefits from the side effects.  Use over the counter nasal spray like affrin for up to 3 days.  Use a humidifier.  I expect that you should be feeling better in 1 week or so.  Your max dose of acetaminophen (Tylenol) is 1000 mg every 6 hours. Make sure to not exceed this with combo medicines. Let us know if you have trouble breathing or chest pains or are not getting better

## 2011-08-10 NOTE — Assessment & Plan Note (Signed)
Viral URI with cough most likely given no other explanation for her symptoms. Plan to treat cough with guaifenesin codeine cough medicine. Will treat other symptoms with Tylenol and over-the-counter nasal spray as needed. We'll followup in one week if not improved. At that point would favor chest x-ray and possible treatment with antibiotics. Patient expresses understanding and will be on the alert for trouble breathing chest pain vomiting or diarrhea.

## 2011-08-12 ENCOUNTER — Telehealth: Payer: Self-pay | Admitting: Family Medicine

## 2011-08-12 NOTE — Telephone Encounter (Signed)
Called pt. Per Dr. Zollie Pee note should give it a week. If not better, come back to see [place]. Pt denies fever. Just coughing. Lorenda Hatchet, Renato Battles

## 2011-08-12 NOTE — Telephone Encounter (Signed)
Patient is calling to see if she can get a different cough medicine because what Dr. Denyse Amass prescribed doesn't seem to be working.

## 2011-09-09 ENCOUNTER — Other Ambulatory Visit: Payer: Self-pay | Admitting: Family Medicine

## 2011-09-09 NOTE — Telephone Encounter (Signed)
Refill request

## 2011-10-07 ENCOUNTER — Ambulatory Visit: Payer: Medicare Other | Admitting: Family Medicine

## 2011-10-14 ENCOUNTER — Ambulatory Visit: Payer: Medicare Other | Admitting: Family Medicine

## 2011-10-20 ENCOUNTER — Telehealth: Payer: Self-pay | Admitting: Family Medicine

## 2011-10-20 NOTE — Telephone Encounter (Signed)
Patient scheduled for 5/1 in am, but she would still like to speak with her MD because she was told by the Asthma MD to completely stop taking her BP medication and that is concerning to her.

## 2011-10-20 NOTE — Telephone Encounter (Signed)
Patient is calling because her Asthma MD  Said the Lisinopril needs to be changed to an non ace inhibitor because it is giving her a cough that is irritating her Asthma.  She does sound very hoarse.  She would like to speak to Dr. Rivka Safer.

## 2011-10-20 NOTE — Telephone Encounter (Signed)
Patient needs an appointment

## 2011-10-26 ENCOUNTER — Ambulatory Visit: Payer: Medicare Other | Admitting: Family Medicine

## 2011-10-28 ENCOUNTER — Ambulatory Visit (INDEPENDENT_AMBULATORY_CARE_PROVIDER_SITE_OTHER): Payer: Medicare Other | Admitting: Family Medicine

## 2011-10-28 ENCOUNTER — Encounter: Payer: Self-pay | Admitting: Family Medicine

## 2011-10-28 VITALS — BP 168/109 | HR 99 | Temp 97.1°F | Ht 67.0 in | Wt 293.8 lb

## 2011-10-28 DIAGNOSIS — M1990 Unspecified osteoarthritis, unspecified site: Secondary | ICD-10-CM

## 2011-10-28 DIAGNOSIS — E039 Hypothyroidism, unspecified: Secondary | ICD-10-CM

## 2011-10-28 MED ORDER — DEXLANSOPRAZOLE 30 MG PO CPDR: 30.0000 mg | DELAYED_RELEASE_CAPSULE | Freq: Every day | ORAL | Status: DC

## 2011-10-28 MED ORDER — METOPROLOL-HCTZ ER 50-12.5 MG PO TB24: 1.0000 | ORAL_TABLET | Freq: Every day | ORAL | Status: DC

## 2011-10-28 MED ORDER — RANITIDINE HCL 150 MG PO TABS: 150.0000 mg | ORAL_TABLET | Freq: Two times a day (BID) | ORAL | Status: DC

## 2011-10-28 NOTE — Progress Notes (Signed)
  Subjective:   Patient ID: [name] White, female DOB: [birth year] 56 y.o. MRN: [id number] HPI:  1. HTN Patient taken off of her ACEI by allergist 2nd to cough. She has not been on her medications for the last few days. Her blood pressure is higher today because of it.  She is normally complaint, without a high salt diet.  Onset: has been chronic  Time period of: year(s).  Severity is described as mild-moderate.  Course of her symptoms over time is chronic. Aggravating: stress, emotional  Alleviating: none Associated sx/sn: no headache, no visual changes.   Tobacco use: Patient is a non smoker.  Advised patient about the risks of smoking to health.  Review of Systems: Pertinent items are noted in HPI.  Labs Reviewed:  Lab Results  Component Value Date   CREATININE 0.69 09/07/2010     Objective:   Filed Vitals:   10/28/11 1626  BP: 168/109  Pulse: 99  Temp: 97.1 F (36.2 C)  TempSrc: Oral  Height: 5\' 7"  (1.702 m)  Weight: 293 lb 12.8 oz (133.267 kg)   Physical Exam: General: aaf, nad Lungs:  Normal respiratory effort, chest expands symmetrically. Lungs are clear to auscultation, no crackles or wheezes. Heart - Regular rate and rhythm.  No murmurs, gallops or rubs.    Extremities:   Non-tender, No cyanosis, edema, or deformity noted.  Assessment & Plan:

## 2011-10-28 NOTE — Patient Instructions (Signed)
It was great to see you today!  Schedule an appointment to see me as needed.  Meds ordered this encounter  Medications  . Metoprolol-Hydrochlorothiazide 50-12.5 MG TB24    Sig: Take 1 tablet by mouth daily.    Dispense:  90 tablet    Refill:  6   I have referred you to Ortho and endocrinology.

## 2011-10-31 ENCOUNTER — Telehealth: Payer: Self-pay | Admitting: *Deleted

## 2011-10-31 MED ORDER — METOPROLOL-HYDROCHLOROTHIAZIDE 50-25 MG PO TABS: 1.0000 | ORAL_TABLET | Freq: Every day | ORAL | Status: DC

## 2011-10-31 NOTE — Telephone Encounter (Signed)
PA required for Dutoprol.Form placed in MD box

## 2011-11-01 ENCOUNTER — Encounter: Payer: Self-pay | Admitting: Family Medicine

## 2011-11-01 DIAGNOSIS — E039 Hypothyroidism, unspecified: Secondary | ICD-10-CM | POA: Insufficient documentation

## 2011-12-13 ENCOUNTER — Telehealth: Payer: Self-pay | Admitting: Family Medicine

## 2011-12-13 NOTE — Telephone Encounter (Signed)
Patient is calling about the new BP medication.  It is causing her to feel jittery and nervous and she wants to know if that is normal and if she needs to continue with the medication.

## 2011-12-15 NOTE — Telephone Encounter (Signed)
Attempted to call patient back for blood pressure medication question. If she calls back, I would like her scheduled for a blood pressure checkup and to discuss medications.

## 2012-01-04 ENCOUNTER — Ambulatory Visit: Payer: Medicare Other | Admitting: Family Medicine

## 2012-01-06 ENCOUNTER — Encounter: Payer: Self-pay | Admitting: Family Medicine

## 2012-01-06 ENCOUNTER — Ambulatory Visit (INDEPENDENT_AMBULATORY_CARE_PROVIDER_SITE_OTHER): Payer: Medicare Other | Admitting: Family Medicine

## 2012-01-06 VITALS — BP 151/90 | HR 71 | Temp 98.7°F | Ht 67.0 in | Wt 288.6 lb

## 2012-01-06 DIAGNOSIS — I10 Essential (primary) hypertension: Secondary | ICD-10-CM

## 2012-01-06 DIAGNOSIS — R51 Headache: Secondary | ICD-10-CM

## 2012-01-06 MED ORDER — VERAPAMIL HCL 80 MG PO TABS: 80.0000 mg | ORAL_TABLET | Freq: Three times a day (TID) | ORAL | Status: DC

## 2012-01-06 MED ORDER — METHYLPREDNISOLONE SODIUM SUCC 40 MG IJ SOLR
40.0000 mg | Freq: Once | INTRAMUSCULAR | Status: AC
  Administered 2012-01-06: 40 mg via INTRAMUSCULAR

## 2012-01-06 MED ORDER — METHYLPREDNISOLONE SODIUM SUCC 40 MG IJ SOLR
40.0000 mg | Freq: Once | INTRAMUSCULAR | Status: AC
  Administered 2012-01-06: 20 mg via INTRAMUSCULAR

## 2012-01-06 MED ORDER — SODIUM CHLORIDE 0.9 % IV SOLN: 40.0000 mg | Freq: Once | INTRAVENOUS | Status: DC

## 2012-01-06 MED ORDER — ONDANSETRON 4 MG PO TBDP
4.0000 mg | ORAL_TABLET | Freq: Once | ORAL | Status: AC
  Administered 2012-01-06: 4 mg via ORAL

## 2012-01-06 MED ORDER — DIPHENHYDRAMINE HCL 25 MG PO CAPS
50.0000 mg | ORAL_CAPSULE | Freq: Four times a day (QID) | ORAL | Status: DC | PRN
  Administered 2012-01-06: 50 mg via ORAL

## 2012-01-06 NOTE — Patient Instructions (Addendum)
[name] White,  Thank you for coming in to see me today.  Your headaches are mixed tensio and migraine. One contributing factor if your elevated BP.  In the office: we gave solumedrol 50 IM, bendaryl 50 mg, Zofran 4 mg to try to abort the headache.   1. Stop metoprolol-HCTZ 50-12.5. Continue the 50-25 dose.  2. Start verapamil 80 mg TID for BP control and headache prevention.  3. Take trazodone this is also for headache prevention.   F/u with your PCP as needed.   Dr. Armen Pickup

## 2012-01-06 NOTE — Progress Notes (Signed)
Subjective:     Patient ID: [name] White, female   DOB: [birth year], 56 y.o.   MRN: [id number]  HPI 56 yo F presents accompanied by her cousin for same day visit for headache and nausea x 2 weeks. Headache mid forehead and occipital radiating down to neck. Throbbing. No relief with pain Excedrin. No fever, trauma,  visual, auditory, sensory or motor changes or deficits. She is a IT consultant and currently doing finals, increased stress.   MedHx: HTN Review of Systems As per HPI Objective:   Physical Exam BP 151/90  Pulse 71  Temp 98.7 F (37.1 C) (Oral)  Ht 5\' 7"  (1.702 m)  Wt 288 lb 9.6 oz (130.908 kg)  BMI 45.20 kg/m2 General appearance: alert, cooperative and no distress Head: Normocephalic, without obvious abnormality, atraumatic positive tenderness over forehead and occipital and upper back bilateral muscle tenderness improved with massage.  Eyes: conjunctivae/corneas clear. PERRL, EOM's intact. Fundi benign. Ears: normal TM's and external ear canals both ears Throat: lips, mucosa, and tongue normal; teeth and gums normal Neurologic: Alert and oriented X 3, normal strength and tone. Normal symmetric reflexes. Normal coordination and gait  Assessment and Plan:

## 2012-01-06 NOTE — Assessment & Plan Note (Signed)
A: Your headaches are mixed tensio and migraine. One contributing factor if your elevated BP.   P: -In the office: we gave solumedrol 50 IM, bendaryl 50 mg, Zofran 4 mg to try to abort the headache.  1. Stop metoprolol-HCTZ 50-12.5. Continue the 50-25 dose.  2. Start verapamil 80 mg TID for BP control and headache prevention.  3. Take trazodone this is also for headache prevention.

## 2012-02-27 ENCOUNTER — Emergency Department (HOSPITAL_COMMUNITY)
Admission: EM | Admit: 2012-02-27 | Discharge: 2012-02-27 | Disposition: A | Discharge status: Home / Self Care | Payer: Medicare Other | Attending: Emergency Medicine | Admitting: Emergency Medicine

## 2012-02-27 ENCOUNTER — Encounter (HOSPITAL_COMMUNITY): Payer: Self-pay

## 2012-02-27 DIAGNOSIS — M1990 Unspecified osteoarthritis, unspecified site: Secondary | ICD-10-CM | POA: Insufficient documentation

## 2012-02-27 DIAGNOSIS — J45909 Unspecified asthma, uncomplicated: Secondary | ICD-10-CM | POA: Insufficient documentation

## 2012-02-27 DIAGNOSIS — M25569 Pain in unspecified knee: Secondary | ICD-10-CM | POA: Insufficient documentation

## 2012-02-27 DIAGNOSIS — K219 Gastro-esophageal reflux disease without esophagitis: Secondary | ICD-10-CM | POA: Insufficient documentation

## 2012-02-27 DIAGNOSIS — Z79899 Other long term (current) drug therapy: Secondary | ICD-10-CM | POA: Insufficient documentation

## 2012-02-27 DIAGNOSIS — E079 Disorder of thyroid, unspecified: Secondary | ICD-10-CM | POA: Insufficient documentation

## 2012-02-27 DIAGNOSIS — I10 Essential (primary) hypertension: Secondary | ICD-10-CM | POA: Insufficient documentation

## 2012-02-27 DIAGNOSIS — E669 Obesity, unspecified: Secondary | ICD-10-CM | POA: Insufficient documentation

## 2012-02-27 DIAGNOSIS — Z87891 Personal history of nicotine dependence: Secondary | ICD-10-CM | POA: Insufficient documentation

## 2012-02-27 MED ORDER — TRAMADOL HCL 50 MG PO TABS: 50.0000 mg | ORAL_TABLET | Freq: Four times a day (QID) | ORAL | Status: AC | PRN

## 2012-02-27 MED ORDER — KETOROLAC TROMETHAMINE 30 MG/ML IJ SOLN
30.0000 mg | Freq: Once | INTRAMUSCULAR | Status: AC
  Administered 2012-02-27: 30 mg via INTRAMUSCULAR
  Filled 2012-02-27: qty 1

## 2012-02-27 NOTE — ED Notes (Signed)
Patient reports that she was getting out of her car and turned to walk, but her right knee did not turn with her. Patient states that she has increased pain when she lifts her leg up and has pain in her shin area.Right pedal pulse present, able to wiggle toes and right foot warm to touch .Patient states she has been using ice periodically. Patient came in using one crutch and states she has needed it to walk with.

## 2012-02-27 NOTE — ED Provider Notes (Signed)
Medical screening examination/treatment/procedure(s) were performed by non-physician practitioner and as supervising physician I was immediately available for consultation/collaboration.    Sanav Remer R Victoriano Campion, MD 02/27/12 2321 

## 2012-02-27 NOTE — Discharge Instructions (Signed)
Be sure to read and understand instructions below prior to leaving the hospital. If your symptoms persist without any improvement in 1 week it is recommended that you follow up with orthopedics listed above. Use your pain medication as prescribed and do not operate heavy machinery while on pain medication.  ° °Knee Effusion  °The medical term for having fluid in your knee is effusion.This means something is wrong inside the knee. Some of the causes of fluid in the knee may be torn cartilage, a torn ligament, or bleeding into the joint from an injury. Small tears may heal on their own with conservative treatment. Conservative means rest, limited weight bearing activity and muscle strengthening exercises. Your recovery may take up to 6 weeks. Larger tears may require surgery.  ° °TREATMENT  °Rest, ice, elevation, and compression are the basic modes of treatment.   °Apply ice to the sore area for 15 to 20 minutes, 3 to 4 times per day. Do this while you are awake for the first 2 days, or as directed. This can be stopped when the swelling goes away. Put the ice in a plastic bag and place a towel between the bag of ice and your skin.  °Keep your leg elevated when possible to lessen swelling.  °If your caregiver recommends crutches, use them as instructed for 1 week. Then, you may walk as tolerated.  °Do not drive a vehicle on pain medication. °ACTIVITY: °           - Weight bearing as tolerated °           - Exercises should be limited to pain free range of motion ° °Knee Immobilization:: This is used to support and protect an injured or painful knee. Knee immobilizers keep your knee from being used while it is healing.  °Use powder to control irritation from sweat and friction.  °Adjust the immobilizer to be firm but not tight. Signs of an immobilizer that is too tight include:  ° Swelling.  ° Numbness.  ° Color change in your foot or ankle.  ° Increased pain.  °While resting, raise your leg above the level of your  heart. This reduces throbbing and helps healing. Prop it up with pillows.  °Remove the immobilizer to bathe and sleep. Wear it other times until you see your doctor again.  °             °SEEK MEDICAL CARE IF:  °You have an increase in bruising, swelling, or pain.  °Your toes feel cold.  °Pain relief is not achieved with medications.  °EMERGENCY:: Your toes are numb or blue or you have severe pain.  °You notice redness, swelling, warmth or increasing pain in your knee.  °An unexplained oral temperature above 102° F (38.9° C) develops. ° °COLD THERAPY DIRECTIONS:  °Ice or gel packs can be used to reduce both pain and swelling. Ice is the most helpful within the first 24 to 48 hours after an injury or flareup from overusing a muscle or joint.  Ice is effective, has very few side effects, and is safe for most people to use.  ° °If you expose your skin to cold temperatures for too long or without the proper protection, you can damage your skin or nerves. Watch for signs of skin damage due to cold.  ° °HOME CARE INSTRUCTIONS  °Follow these tips to use ice and cold packs safely.  °Place a dry or damp towel between the ice and skin. A damp   towel will cool the skin more quickly, so you may need to shorten the time that the ice is used.  °For a more rapid response, add gentle compression to the ice.  °Ice for no more than 10 to 20 minutes at a time. The bonier the area you are icing, the less time it will take to get the benefits of ice.  °Check your skin after 5 minutes to make sure there are no signs of a poor response to cold or skin damage.  °Rest 20 minutes or more in between uses.  °Once your skin is numb, you can end your treatment. You can test numbness by very lightly touching your skin. The touch should be so light that you do not see the skin dimple from the pressure of your fingertip. When using ice, most people will feel these normal sensations in this order: cold, burning, aching, and numbness.  °Do not use ice  on someone who cannot communicate their responses to pain, such as small children or people with dementia.  ° °HOW TO MAKE AN ICE PACK  °To make an ice pack, do one of the following:  °Place crushed ice or a bag of frozen vegetables in a sealable plastic bag. Squeeze out the excess air. Place this bag inside another plastic bag. Slide the bag into a pillowcase or place a damp towel between your skin and the bag.  °Mix 3 parts water with 1 part rubbing alcohol. Freeze the mixture in a sealable plastic bag. When you remove the mixture from the freezer, it will be slushy. Squeeze out the excess air. Place this bag inside another plastic bag. Slide the bag into a pillowcase or place a damp towel between your s ° ° ° ° ° °

## 2012-02-27 NOTE — ED Provider Notes (Signed)
History     CSN: [id number]  Arrival date & time 02/27/12  1415   First MD Initiated Contact with Patient 02/27/12 1528      Chief Complaint  Patient presents with  . Knee Pain    (Consider location/radiation/quality/duration/timing/severity/associated sxs/prior treatment) HPI Comments: Patient presents today with a chief complaint of right knee pain.  She began having the pain earlier today.  She twisted her knee while getting out of her car.  Pain increases with ambulation.  She has tried icing her knee and has put Voltaren cream on the area, but does not feel that it helps.  PMH significant for OA of both knees.  She denies any swelling, erythema, or warmth of the knee.  Denies numbness or tingling.  She has been using one crutch to help ambulate.  She states that she only had one at home.    The history is provided by the patient.    Past Medical History  Diagnosis Date  . Asthma   . Hypertension   . Thyroid disease   . Osteoarthritis   . GERD (gastroesophageal reflux disease)     Past Surgical History  Procedure Date  . Abdominal hysterectomy   . Cystectomy   . Mouth surgery   . Rectal surgery     Family History  Problem Relation Age of Onset  . Osteoarthritis Mother   . Asthma Mother   . Heart failure Mother     History  Substance Use Topics  . Smoking status: Former Games developer  . Smokeless tobacco: Not on file  . Alcohol Use: No    OB History    Grav Para Term Preterm Abortions TAB SAB Ect Mult Living                  Review of Systems  Constitutional: Negative for fever and chills.  Musculoskeletal: Negative for joint swelling.       Right knee pain  Skin: Negative for color change and wound.  Neurological: Negative for numbness.  All other systems reviewed and are negative.    Allergies  Ciprofloxacin; Vicodin; Erythromycin; and Sulfamethoxazole w-trimethoprim  Home Medications   Current Outpatient Rx  Name Route Sig Dispense Refill  .  ALBUTEROL SULFATE HFA 108 (90 BASE) MCG/ACT IN AERS Inhalation Inhale 2 puffs into the lungs every 6 (six) hours as needed. For shortness of breath    . ALBUTEROL SULFATE (2.5 MG/3ML) 0.083% IN NEBU Nebulization Take 2.5 mg by nebulization every 6 (six) hours as needed. For shortness of breath    . ASPIRIN-ACETAMINOPHEN-CAFFEINE 250-250-65 MG PO TABS Oral Take 1 tablet by mouth every 6 (six) hours as needed. For pain     . AZELASTINE HCL 0.05 % OP SOLN Both Eyes Place 1 drop into both eyes 2 (two) times daily as needed. For dry/irritated eyes     . BECLOMETHASONE DIPROPIONATE 80 MCG/ACT IN AERS Inhalation Inhale 1 puff into the lungs as needed.    . BUDESONIDE-FORMOTEROL FUMARATE 160-4.5 MCG/ACT IN AERO Inhalation Inhale 2 puffs into the lungs 2 (two) times daily.      Marland Kitchen CETIRIZINE HCL 10 MG PO TABS Oral Take 10 mg by mouth daily.      . DEXLANSOPRAZOLE 30 MG PO CPDR Oral Take 30 mg by mouth daily.    Marland Kitchen DICLOFENAC SODIUM 1 % TD GEL Topical Apply 1 application topically 2 (two) times daily as needed. On knees for pain/inflamation     . DULOXETINE HCL 60 MG PO  CPEP Oral Take 60 mg by mouth daily.      Marland Kitchen ESTRADIOL 0.025 MG/24HR TD PTWK Transdermal Place 1 patch onto the skin every 7 (seven) days. On thursdays     . HYDROXYZINE HCL 50 MG PO TABS Oral Take 50 mg by mouth daily.     Marland Kitchen METOPROLOL-HYDROCHLOROTHIAZIDE 50-25 MG PO TABS Oral Take 1 tablet by mouth daily.    . OLOPATADINE HCL 0.6 % NA SOLN Nasal Place 1 puff into the nose 2 (two) times daily.      Marland Kitchen RANITIDINE HCL 150 MG PO TABS Oral Take 150 mg by mouth 2 (two) times daily.    . TRAZODONE HCL 50 MG PO TABS Oral Take 50 mg by mouth at bedtime.      Marland Kitchen VERAPAMIL HCL 80 MG PO TABS Oral Take 80 mg by mouth 3 (three) times daily.      BP 113/43  Pulse 74  Temp 98.8 F (37.1 C) (Oral)  Resp 20  SpO2 95%  Physical Exam  Nursing note and vitals reviewed. Constitutional: She appears well-developed and well-nourished. No distress.       Obese   HENT:  Head: Normocephalic and atraumatic.  Mouth/Throat: Oropharynx is clear and moist.  Cardiovascular: Normal rate, regular rhythm and normal heart sounds.   Pulses:      Dorsalis pedis pulses are 2+ on the right side, and 2+ on the left side.  Pulmonary/Chest: Effort normal and breath sounds normal.  Musculoskeletal:       Right knee: She exhibits no swelling, no effusion and no bony tenderness. no tenderness found.       Pain with right knee ROM Knee exam limited somewhat secondary to pain, but no obvious laxity of LCL, MCL, ACL, or PCL No erythema, swelling, or warmth of right knee  Neurological: She is alert. She has normal strength. No sensory deficit.       Pain with ambulation  Skin: Skin is warm, dry and intact. No bruising and no ecchymosis noted. She is not diaphoretic. No erythema.  Psychiatric: She has a normal mood and affect.    ED Course  Procedures (including critical care time)  Labs Reviewed - No data to display No results found.   No diagnosis found.    MDM  Patient presenting with right knee pain.  No bony tenderness.  No erythema, warmth, or swelling.  Suspect ligament strain.  Patient given ACE wrap and crutches and instructed to follow up with Orthopedics if pain continues.        Pascal Lux Ferguson, PA-C 02/27/12 1737

## 2012-02-27 NOTE — ED Notes (Signed)
Ace wrap applied to the right knee.

## 2012-03-20 ENCOUNTER — Telehealth: Payer: Self-pay | Admitting: Family Medicine

## 2012-03-20 NOTE — Telephone Encounter (Signed)
Please call patient to schedule an appointment to meet new doctor in 1-2 months.  Thanks

## 2012-03-21 NOTE — Telephone Encounter (Signed)
Called and LMOVM asking pt to call back to schedule an appt to meet new pcp.Laureen Ochs, Viann Shove

## 2012-03-22 NOTE — Telephone Encounter (Signed)
LMOM to call and schedule appt with pcp

## 2012-04-01 ENCOUNTER — Other Ambulatory Visit: Payer: Self-pay

## 2012-04-01 ENCOUNTER — Encounter (HOSPITAL_COMMUNITY): Payer: Self-pay | Admitting: Emergency Medicine

## 2012-04-01 ENCOUNTER — Emergency Department (HOSPITAL_COMMUNITY): Payer: Medicare Other

## 2012-04-01 ENCOUNTER — Emergency Department (HOSPITAL_COMMUNITY)
Admission: EM | Admit: 2012-04-01 | Discharge: 2012-04-01 | Disposition: A | Discharge status: Home / Self Care | Payer: Medicare Other | Attending: Emergency Medicine | Admitting: Emergency Medicine

## 2012-04-01 DIAGNOSIS — R05 Cough: Secondary | ICD-10-CM | POA: Insufficient documentation

## 2012-04-01 DIAGNOSIS — R0602 Shortness of breath: Secondary | ICD-10-CM | POA: Insufficient documentation

## 2012-04-01 DIAGNOSIS — J45901 Unspecified asthma with (acute) exacerbation: Secondary | ICD-10-CM | POA: Insufficient documentation

## 2012-04-01 DIAGNOSIS — E0789 Other specified disorders of thyroid: Secondary | ICD-10-CM | POA: Insufficient documentation

## 2012-04-01 DIAGNOSIS — E669 Obesity, unspecified: Secondary | ICD-10-CM | POA: Insufficient documentation

## 2012-04-01 DIAGNOSIS — K219 Gastro-esophageal reflux disease without esophagitis: Secondary | ICD-10-CM | POA: Insufficient documentation

## 2012-04-01 DIAGNOSIS — R000 Tachycardia, unspecified: Secondary | ICD-10-CM | POA: Insufficient documentation

## 2012-04-01 DIAGNOSIS — R059 Cough, unspecified: Secondary | ICD-10-CM | POA: Insufficient documentation

## 2012-04-01 DIAGNOSIS — I10 Essential (primary) hypertension: Secondary | ICD-10-CM | POA: Insufficient documentation

## 2012-04-01 DIAGNOSIS — M1990 Unspecified osteoarthritis, unspecified site: Secondary | ICD-10-CM | POA: Insufficient documentation

## 2012-04-01 DIAGNOSIS — Z79899 Other long term (current) drug therapy: Secondary | ICD-10-CM | POA: Insufficient documentation

## 2012-04-01 LAB — BASIC METABOLIC PANEL
BUN: 11 mg/dL (ref 6–23)
Chloride: 99 mEq/L (ref 96–112)
GFR calc Af Amer: >90 mL/min (ref >90)
GFR calc non Af Amer: >90 mL/min (ref >90)
Glucose, Bld: 153 mg/dL — ABNORMAL HIGH (ref 70–99)
Potassium: 3.4 mEq/L — ABNORMAL LOW (ref 3.5–5.1)
Sodium: 138 mEq/L (ref 135–145)

## 2012-04-01 LAB — CBC
HCT: 40.7 % (ref 36.0–46.0)
Hemoglobin: 13.6 g/dL (ref 12.0–15.0)
MCHC: 33.4 g/dL (ref 30.0–36.0)
RBC: 4.33 MIL/uL (ref 3.87–5.11)

## 2012-04-01 MED ORDER — ALBUTEROL SULFATE (5 MG/ML) 0.5% IN NEBU
INHALATION_SOLUTION | RESPIRATORY_TRACT | Status: AC
  Filled 2012-04-01: qty 3

## 2012-04-01 MED ORDER — ALBUTEROL SULFATE (5 MG/ML) 0.5% IN NEBU
INHALATION_SOLUTION | RESPIRATORY_TRACT | Status: AC
  Administered 2012-04-01: 18:00:00
  Filled 2012-04-01: qty 2

## 2012-04-01 MED ORDER — IPRATROPIUM BROMIDE 0.02 % IN SOLN
0.5000 mg | Freq: Once | RESPIRATORY_TRACT | Status: AC
  Administered 2012-04-01: 0.5 mg via RESPIRATORY_TRACT

## 2012-04-01 MED ORDER — ALBUTEROL (5 MG/ML) CONTINUOUS INHALATION SOLN
15.0000 mg | INHALATION_SOLUTION | RESPIRATORY_TRACT | Status: DC
  Administered 2012-04-01: 15 mg via RESPIRATORY_TRACT

## 2012-04-01 MED ORDER — IPRATROPIUM BROMIDE 0.02 % IN SOLN
RESPIRATORY_TRACT | Status: AC
  Administered 2012-04-01: 18:00:00
  Filled 2012-04-01: qty 2.5

## 2012-04-01 MED ORDER — PREDNISONE 20 MG PO TABS: 40.0000 mg | ORAL_TABLET | Freq: Every day | ORAL | Status: DC

## 2012-04-01 MED ORDER — METHYLPREDNISOLONE SODIUM SUCC 125 MG IJ SOLR
INTRAMUSCULAR | Status: AC
  Administered 2012-04-01: 18:00:00
  Filled 2012-04-01: qty 2

## 2012-04-01 NOTE — ED Notes (Signed)
Reports sob for a week. Took nebulizer without relief.

## 2012-04-01 NOTE — ED Notes (Addendum)
Reports has been sob for a week. Took nebulizer without relief. Patient has a spasm type non-productive cough. EMS tx. Patient with 15mg  albuterol & 0.5mg  atrovent, 125mg  solu-medrol iv by 20g. jelco left hand.

## 2012-04-01 NOTE — ED Notes (Signed)
Pt able to ambulate w/o assistance. SPO2 95% RA. P160. Pt c/o minor lightheadedness that resolved at rest. RN Alinda Money T notified.

## 2012-04-01 NOTE — ED Notes (Signed)
Watching TV.  

## 2012-04-01 NOTE — ED Notes (Signed)
GNF:[id number]<YQ> Expected date:04/01/12<BR> Expected time: 5:40 PM<BR> Means of arrival:Ambulance<BR> Comments:<BR> Asthma Attack 3 treatments

## 2012-04-01 NOTE — ED Provider Notes (Signed)
History    -year-old female with shortness of breath. Patient reports that she's had a mild cough for the past week but began feeling very short of breath earlier today. Cough is nonproductive. History of asthma and says that symptoms feel similar to previous exacerbations. Patient received 15mg  of albuterol and a half milligram of Atrovent prior to arrival. She also received steroids. Denies fevers or chills. No nausea or vomiting. No unusual leg pain or swelling. Denies chest pain.  CSN: [id number]  Arrival date & time 04/01/12  1801   First MD Initiated Contact with Patient 04/01/12 1813      Chief Complaint  Patient presents with  . Shortness of Breath    (Consider location/radiation/quality/duration/timing/severity/associated sxs/prior treatment) HPI  Past Medical History  Diagnosis Date  . Asthma   . Hypertension   . Thyroid disease   . Osteoarthritis   . GERD (gastroesophageal reflux disease)     Past Surgical History  Procedure Date  . Abdominal hysterectomy   . Cystectomy   . Mouth surgery   . Rectal surgery     Family History  Problem Relation Age of Onset  . Osteoarthritis Mother   . Asthma Mother   . Heart failure Mother     History  Substance Use Topics  . Smoking status: Former Games developer  . Smokeless tobacco: Not on file  . Alcohol Use: No    OB History    Grav Para Term Preterm Abortions TAB SAB Ect Mult Living                  Review of Systems   Review of symptoms negative unless otherwise noted in HPI.   Allergies  Ciprofloxacin; Vicodin; Erythromycin; and Sulfamethoxazole w-trimethoprim  Home Medications   Current Outpatient Rx  Name Route Sig Dispense Refill  . ALBUTEROL SULFATE HFA 108 (90 BASE) MCG/ACT IN AERS Inhalation Inhale 2 puffs into the lungs every 6 (six) hours as needed. For shortness of breath    . ALBUTEROL SULFATE (2.5 MG/3ML) 0.083% IN NEBU Nebulization Take 2.5 mg by nebulization every 6 (six) hours as needed. For  shortness of breath    . ASPIRIN-ACETAMINOPHEN-CAFFEINE 250-250-65 MG PO TABS Oral Take 1-2 tablets by mouth every 6 (six) hours as needed. For pain    . AZELASTINE HCL 0.05 % OP SOLN Both Eyes Place 1 drop into both eyes 2 (two) times daily as needed. For dry/irritated eyes     . BECLOMETHASONE DIPROPIONATE 80 MCG/ACT IN AERS Inhalation Inhale 2 puffs into the lungs 2 (two) times daily.     . BUDESONIDE-FORMOTEROL FUMARATE 160-4.5 MCG/ACT IN AERO Inhalation Inhale 2 puffs into the lungs 2 (two) times daily.      Marland Kitchen CETIRIZINE HCL 10 MG PO TABS Oral Take 10 mg by mouth every morning.     . DEXLANSOPRAZOLE 30 MG PO CPDR Oral Take 30 mg by mouth every morning.     Marland Kitchen DICLOFENAC SODIUM 1 % TD GEL Topical Apply 1 application topically 2 (two) times daily as needed. On knees for pain/inflamation     . DULOXETINE HCL 60 MG PO CPEP Oral Take 60 mg by mouth at bedtime.     Marland Kitchen ESTRADIOL 0.025 MG/24HR TD PTWK Transdermal Place 1 patch onto the skin every 7 (seven) days. On thursdays     . HYDROXYZINE HCL 50 MG PO TABS Oral Take 50 mg by mouth daily.     Marland Kitchen METOPROLOL-HYDROCHLOROTHIAZIDE 50-25 MG PO TABS Oral Take 1 tablet by  mouth at bedtime.     . OLOPATADINE HCL 0.6 % NA SOLN Nasal Place 1 puff into the nose 2 (two) times daily.      Marland Kitchen RANITIDINE HCL 300 MG PO TABS Oral Take 300 mg by mouth at bedtime.     . TRAZODONE HCL 50 MG PO TABS Oral Take 50 mg by mouth at bedtime.      Marland Kitchen VERAPAMIL HCL 80 MG PO TABS Oral Take 80 mg by mouth 3 (three) times daily.    Marland Kitchen VITAMIN C 500 MG PO TABS Oral Take 500 mg by mouth every morning.      BP 124/71  Pulse 159  Resp 13  SpO2 96%  Physical Exam  Nursing note and vitals reviewed. Constitutional: She appears well-developed and well-nourished. No distress.       obese  HENT:  Head: Normocephalic and atraumatic.  Eyes: Conjunctivae normal are normal. Right eye exhibits no discharge. Left eye exhibits no discharge.  Neck: Neck supple.  Cardiovascular: Normal rate,  regular rhythm and normal heart sounds.  Exam reveals no gallop and no friction rub.   No murmur heard.      Tachycardic w/ a regular rhythm  Pulmonary/Chest: Effort normal. No respiratory distress. She has wheezes.       Diffuse wheezing bilaterally. Prolonged expiratory phase.  Abdominal: Soft. She exhibits no distension. There is no tenderness.  Musculoskeletal: She exhibits no edema and no tenderness.       Lower extremities symmetric as compared to each other. No calf tenderness. Negative Homan's. No palpable cords.   Neurological: She is alert.  Skin: Skin is warm and dry.  Psychiatric: She has a normal mood and affect. Her behavior is normal. Thought content normal.    ED Course  Procedures (including critical care time)   Labs Reviewed  BASIC METABOLIC PANEL  CBC   No results found.   1. Asthma exacerbation       MDM      8:10 PM Pt remains significantly tachycardic. Pt received 30mg  of albuterol between EMS and in ED. Sinus tach. Pt sounds markedly improved and reports the same. Would like to go home. I feel she is safe to do so and suspect that her heart rate is purely from beta agonist medication. She is clinically euvolemic. I do not think she has a PE. I do not think she is septic. Will ambulate prior to DC. If can do so without complaint will DC.  Raeford Razor, MD 04/06/12 [phone]

## 2012-04-01 NOTE — ED Notes (Addendum)
Patient's respiratory tx still in process. Conscious and alert, texting. Noted at this time no complaints verbalized. Cardiac Monitor showing ST 155. Skin w/d.

## 2012-04-01 NOTE — ED Notes (Signed)
MD at bedside. 

## 2012-04-10 ENCOUNTER — Ambulatory Visit: Payer: Medicare Other | Admitting: Family Medicine

## 2012-04-25 ENCOUNTER — Ambulatory Visit: Payer: Medicare Other | Admitting: Family Medicine

## 2012-04-25 ENCOUNTER — Other Ambulatory Visit: Payer: Self-pay | Admitting: Family Medicine

## 2012-05-01 ENCOUNTER — Ambulatory Visit: Payer: Medicare Other | Admitting: Family Medicine

## 2012-05-07 ENCOUNTER — Other Ambulatory Visit: Payer: Self-pay | Admitting: Family Medicine

## 2012-05-07 DIAGNOSIS — Z1231 Encounter for screening mammogram for malignant neoplasm of breast: Secondary | ICD-10-CM

## 2012-05-25 ENCOUNTER — Other Ambulatory Visit: Payer: Self-pay | Admitting: Family Medicine

## 2012-05-25 ENCOUNTER — Ambulatory Visit (HOSPITAL_COMMUNITY): Payer: Medicare Other

## 2012-06-12 ENCOUNTER — Ambulatory Visit (HOSPITAL_COMMUNITY): Payer: Medicare Other

## 2012-06-28 ENCOUNTER — Ambulatory Visit (HOSPITAL_COMMUNITY): Payer: Medicare Other

## 2012-07-06 ENCOUNTER — Ambulatory Visit (HOSPITAL_COMMUNITY): Payer: Medicare Other

## 2012-07-30 ENCOUNTER — Encounter (HOSPITAL_COMMUNITY): Payer: Self-pay

## 2012-07-30 ENCOUNTER — Emergency Department (INDEPENDENT_AMBULATORY_CARE_PROVIDER_SITE_OTHER)
Admission: EM | Admit: 2012-07-30 | Discharge: 2012-07-30 | Disposition: A | Discharge status: Home / Self Care | Payer: Medicare Other | Source: Home / Self Care | Attending: Family Medicine | Admitting: Family Medicine

## 2012-07-30 DIAGNOSIS — A084 Viral intestinal infection, unspecified: Secondary | ICD-10-CM

## 2012-07-30 DIAGNOSIS — A088 Other specified intestinal infections: Secondary | ICD-10-CM

## 2012-07-30 MED ORDER — GI COCKTAIL ~~LOC~~
ORAL | Status: AC
  Filled 2012-07-30: qty 30

## 2012-07-30 MED ORDER — GI COCKTAIL ~~LOC~~
30.0000 mL | Freq: Once | ORAL | Status: AC
  Administered 2012-07-30: 30 mL via ORAL

## 2012-07-30 MED ORDER — PANTOPRAZOLE SODIUM 40 MG PO TBEC: 40.0000 mg | DELAYED_RELEASE_TABLET | Freq: Every day | ORAL | Status: DC

## 2012-07-30 MED ORDER — ONDANSETRON 4 MG PO TBDP
8.0000 mg | ORAL_TABLET | Freq: Once | ORAL | Status: AC
  Administered 2012-07-30: 8 mg via ORAL

## 2012-07-30 MED ORDER — ONDANSETRON 4 MG PO TBDP
ORAL_TABLET | ORAL | Status: AC
  Filled 2012-07-30: qty 2

## 2012-07-30 NOTE — ED Provider Notes (Signed)
History     CSN: [id number]  Arrival date & time 07/30/12  1550   First MD Initiated Contact with Patient 07/30/12 1611      Chief Complaint  Patient presents with  . Abdominal Pain    (Consider location/radiation/quality/duration/timing/severity/associated sxs/prior treatment) Patient is a 57 y.o. female presenting with abdominal pain. The history is provided by the patient.  Abdominal Pain The primary symptoms of the illness include abdominal pain, nausea and diarrhea. The primary symptoms of the illness do not include fever, vomiting, hematemesis, hematochezia or dysuria. The current episode started yesterday. The onset of the illness was sudden. The problem has been gradually improving.  The patient states that she believes she is currently not pregnant. Additional symptoms associated with the illness include anorexia and heartburn. Symptoms associated with the illness do not include chills, urgency or frequency. Significant associated medical issues include GERD.    Past Medical History  Diagnosis Date  . Asthma   . Hypertension   . Thyroid disease   . Osteoarthritis   . GERD (gastroesophageal reflux disease)     Past Surgical History  Procedure Date  . Abdominal hysterectomy   . Cystectomy   . Mouth surgery   . Rectal surgery     Family History  Problem Relation Age of Onset  . Osteoarthritis Mother   . Asthma Mother   . Heart failure Mother     History  Substance Use Topics  . Smoking status: Former Games developer  . Smokeless tobacco: Not on file  . Alcohol Use: No    OB History    Grav Para Term Preterm Abortions TAB SAB Ect Mult Living                  Review of Systems  Constitutional: Negative for fever and chills.  HENT: Negative.   Respiratory: Negative.   Gastrointestinal: Positive for heartburn, nausea, abdominal pain, diarrhea and anorexia. Negative for vomiting, blood in stool, hematochezia and hematemesis.  Genitourinary: Negative for dysuria,  urgency and frequency.    Allergies  Ciprofloxacin; Vicodin; Erythromycin; and Sulfamethoxazole w-trimethoprim  Home Medications   Current Outpatient Rx  Name  Route  Sig  Dispense  Refill  . ALBUTEROL SULFATE (2.5 MG/3ML) 0.083% IN NEBU   Nebulization   Take 2.5 mg by nebulization every 6 (six) hours as needed. For shortness of breath         . BECLOMETHASONE DIPROPIONATE 80 MCG/ACT IN AERS   Inhalation   Inhale 2 puffs into the lungs 2 (two) times daily.          Marland Kitchen CETIRIZINE HCL 10 MG PO TABS   Oral   Take 10 mg by mouth every morning.          . DEXLANSOPRAZOLE 30 MG PO CPDR   Oral   Take 30 mg by mouth every morning.          Marland Kitchen DICLOFENAC SODIUM 1 % TD GEL   Topical   Apply 1 application topically 2 (two) times daily as needed. On knees for pain/inflamation          . DULOXETINE HCL 60 MG PO CPEP   Oral   Take 60 mg by mouth at bedtime.          Marland Kitchen ESTRADIOL 0.025 MG/24HR TD PTWK      apply 1 patch TO SKIN EVERY 7 DAYS   4 patch   11   . HYDROXYZINE HCL 50 MG PO TABS  Oral   Take 50 mg by mouth daily.          . TRAZODONE HCL 50 MG PO TABS   Oral   Take 50 mg by mouth at bedtime.           Marland Kitchen VERAPAMIL HCL 80 MG PO TABS   Oral   Take 80 mg by mouth 3 (three) times daily.         Marland Kitchen VITAMIN C 500 MG PO TABS   Oral   Take 500 mg by mouth every morning.         . ALBUTEROL SULFATE HFA 108 (90 BASE) MCG/ACT IN AERS   Inhalation   Inhale 2 puffs into the lungs every 6 (six) hours as needed. For shortness of breath         . ASPIRIN-ACETAMINOPHEN-CAFFEINE 250-250-65 MG PO TABS   Oral   Take 1-2 tablets by mouth every 6 (six) hours as needed. For pain         . AZELASTINE HCL 0.05 % OP SOLN   Both Eyes   Place 1 drop into both eyes 2 (two) times daily as needed. For dry/irritated eyes          . BUDESONIDE-FORMOTEROL FUMARATE 160-4.5 MCG/ACT IN AERO   Inhalation   Inhale 2 puffs into the lungs 2 (two) times daily.            Marland Kitchen ESTRADIOL 0.025 MG/24HR TD PTWK   Transdermal   Place 1 patch onto the skin every 7 (seven) days. On thursdays          . METOPROLOL-HYDROCHLOROTHIAZIDE 50-25 MG PO TABS   Oral   Take 1 tablet by mouth at bedtime.          Marland Kitchen METOPROLOL-HYDROCHLOROTHIAZIDE 50-25 MG PO TABS      take 1 tablet by mouth daily   30 tablet   0   . OLOPATADINE HCL 0.6 % NA SOLN   Nasal   Place 1 puff into the nose 2 (two) times daily.           Marland Kitchen PANTOPRAZOLE SODIUM 40 MG PO TBEC   Oral   Take 1 tablet (40 mg total) by mouth daily.   30 tablet   1   . PREDNISONE 20 MG PO TABS   Oral   Take 2 tablets (40 mg total) by mouth daily.   10 tablet   0   . RANITIDINE HCL 300 MG PO TABS   Oral   Take 300 mg by mouth at bedtime.            BP 125/84  Pulse 104  Temp 97.8 F (36.6 C) (Oral)  Resp 20  SpO2 96%  Physical Exam  Nursing note and vitals reviewed. Constitutional: She is oriented to person, place, and time. She appears well-developed and well-nourished.  HENT:  Mouth/Throat: Oropharynx is clear and moist.  Eyes: Conjunctivae normal are normal. Pupils are equal, round, and reactive to light.  Neck: Normal range of motion. Neck supple.  Cardiovascular: Normal rate, normal heart sounds and intact distal pulses.   Pulmonary/Chest: Breath sounds normal.  Abdominal: Soft. Bowel sounds are normal. She exhibits no distension and no mass. There is tenderness. There is no rebound and no guarding.  Lymphadenopathy:    She has no cervical adenopathy.  Neurological: She is alert and oriented to person, place, and time.  Skin: Skin is warm and dry.    ED Course  Procedures (including  critical care time)  Labs Reviewed - No data to display No results found.   1. Gastroenteritis and colitis, viral       MDM          Linna Hoff, MD 07/31/12 [phone]

## 2012-07-30 NOTE — ED Notes (Signed)
C/o abdominal pain, used MOM for bowels; NAD

## 2012-08-08 ENCOUNTER — Other Ambulatory Visit: Payer: Self-pay | Admitting: Family Medicine

## 2012-08-18 ENCOUNTER — Emergency Department (INDEPENDENT_AMBULATORY_CARE_PROVIDER_SITE_OTHER)
Admission: EM | Admit: 2012-08-18 | Discharge: 2012-08-18 | Disposition: A | Discharge status: Home / Self Care | Payer: Medicare Other | Source: Home / Self Care | Attending: Emergency Medicine | Admitting: Emergency Medicine

## 2012-08-18 ENCOUNTER — Encounter (HOSPITAL_COMMUNITY): Payer: Self-pay | Admitting: Emergency Medicine

## 2012-08-18 DIAGNOSIS — M7520 Bicipital tendinitis, unspecified shoulder: Secondary | ICD-10-CM

## 2012-08-18 MED ORDER — INFLUENZA VIRUS VACC SPLIT PF IM SUSP
0.5000 mL | Freq: Once | INTRAMUSCULAR | Status: AC
  Administered 2012-08-18: 0.5 mL via INTRAMUSCULAR

## 2012-08-18 MED ORDER — MELOXICAM 15 MG PO TABS: 15.0000 mg | ORAL_TABLET | Freq: Every day | ORAL | Status: DC

## 2012-08-18 MED ORDER — TRAMADOL HCL 50 MG PO TABS: 100.0000 mg | ORAL_TABLET | Freq: Three times a day (TID) | ORAL | Status: DC | PRN

## 2012-08-18 MED ORDER — KETOROLAC TROMETHAMINE 60 MG/2ML IM SOLN
INTRAMUSCULAR | Status: AC
  Filled 2012-08-18: qty 2

## 2012-08-18 MED ORDER — KETOROLAC TROMETHAMINE 60 MG/2ML IM SOLN
60.0000 mg | Freq: Once | INTRAMUSCULAR | Status: AC
  Administered 2012-08-18: 60 mg via INTRAMUSCULAR

## 2012-08-18 MED ORDER — METHOCARBAMOL 500 MG PO TABS: 500.0000 mg | ORAL_TABLET | Freq: Three times a day (TID) | ORAL | Status: DC

## 2012-08-18 NOTE — ED Notes (Signed)
Pt c/o right shoulder and lower back pain x3 weeks Sx include: pain that increases w/activity Hx of arthritis and tendonitis of right shoulder Reports moving and unpacking boxes x2 weeks ago  She is alert w/no signs of acute distress.

## 2012-08-18 NOTE — ED Provider Notes (Addendum)
Chief Complaint  Patient presents with  . Shoulder Pain    History of Present Illness:   [name] White is a 57 year old female who has had a three-week history of right shoulder pain after she moved some heavy boxes. This is located anteriorly and does not radiate. It is worse if she moves her arm in any direction. It radiates down to the upper arm but not below the elbow. She has a little pain in the trapezius ridge but no pain on movement of the neck. There is no numbness, tingling, weakness in the arm. She does have a history of tendinitis in that shoulder in the past and has seen Dr. Wyline Mood for this. She also has some pain in her right lower back area which is worse with movement. There is no radiation the pain, numbness, tingling, weakness in the lower extremities.  Review of Systems:  Other than noted above, the patient denies any of the following symptoms: Systemic:  No fevers, chills, sweats, or aches.  No fatigue or tiredness. Musculoskeletal:  No joint pain, arthritis, bursitis, swelling, back pain, or neck pain. Neurological:  No muscular weakness, paresthesias, headache, or trouble with speech or coordination.  No dizziness.   PMFSH:  Past medical history, family history, social history, meds, and allergies were reviewed.  Physical Exam:   Vital signs:  BP 123/81  Temp(Src) 98.4 F (36.9 C) (Oral)  Resp 18  SpO2 98% Gen:  Alert and oriented times 3.  In no distress. Musculoskeletal: There is no swelling, erythema, or deformity of the shoulder. She does have pain anteriorly. There is pain with any movement of the shoulder and she only has a few degrees of movement. Yergason sign is positive. Unable to perform Neer test, Hawkins tests, or empty cans test, because of pain. Otherwise, all joints had a full a ROM with no swelling, bruising or deformity.  No edema, pulses full. Extremities were warm and pink.  Capillary refill was brisk.  Skin:  Clear, warm and dry.  No rash. Back:  Tenderness to palpation in the right mid lumbar area. Straight leg raising was negative. Neuro:  Alert and oriented times 3.  Muscle strength was normal.  Sensation was intact to light touch.   Course in Urgent Care Center:   She was given a shoulder sling. She was also given Toradol 60 mg IM.  Assessment:  The primary encounter diagnosis was Tendonitis, bicipital. A diagnosis of Lumbar strain was also pertinent to this visit.  Plan:   1.  The following meds were prescribed:   Discharge Medication List as of 08/18/2012  1:51 PM    START taking these medications   Details  meloxicam (MOBIC) 15 MG tablet Take 1 tablet (15 mg total) by mouth daily., Starting 08/18/2012, Until Discontinued, Normal    methocarbamol (ROBAXIN) 500 MG tablet Take 1 tablet (500 mg total) by mouth 3 (three) times daily., Starting 08/18/2012, Until Discontinued, Normal    traMADol (ULTRAM) 50 MG tablet Take 2 tablets (100 mg total) by mouth every 8 (eight) hours as needed for pain., Starting 08/18/2012, Until Discontinued, Normal       2.  The patient was instructed in symptomatic care, including rest and activity, elevation, application of ice and compression.  Appropriate handouts were given. She was given exercises for her shoulder and her back. 3.  The patient was told to return if becoming worse in any way, if no better in 3 or 4 days, and given some red flag  symptoms that would indicate earlier return.   4.  The patient was told to follow up with Dr. Wyline Mood early next week.    Reuben Likes, MD 08/18/12 2002  Reuben Likes, MD 08/18/12 2002

## 2012-10-09 ENCOUNTER — Institutional Professional Consult (permissible substitution): Payer: Medicare Other | Admitting: Internal Medicine

## 2012-12-23 ENCOUNTER — Encounter (HOSPITAL_COMMUNITY): Payer: Self-pay | Admitting: Emergency Medicine

## 2012-12-23 ENCOUNTER — Emergency Department (HOSPITAL_COMMUNITY)
Admission: EM | Admit: 2012-12-23 | Discharge: 2012-12-24 | Disposition: A | Discharge status: Home / Self Care | Payer: Medicare Other | Attending: Emergency Medicine | Admitting: Emergency Medicine

## 2012-12-23 DIAGNOSIS — K219 Gastro-esophageal reflux disease without esophagitis: Secondary | ICD-10-CM | POA: Insufficient documentation

## 2012-12-23 DIAGNOSIS — Z862 Personal history of diseases of the blood and blood-forming organs and certain disorders involving the immune mechanism: Secondary | ICD-10-CM | POA: Insufficient documentation

## 2012-12-23 DIAGNOSIS — Z8639 Personal history of other endocrine, nutritional and metabolic disease: Secondary | ICD-10-CM | POA: Insufficient documentation

## 2012-12-23 DIAGNOSIS — M1990 Unspecified osteoarthritis, unspecified site: Secondary | ICD-10-CM | POA: Insufficient documentation

## 2012-12-23 DIAGNOSIS — E669 Obesity, unspecified: Secondary | ICD-10-CM | POA: Insufficient documentation

## 2012-12-23 DIAGNOSIS — Z79899 Other long term (current) drug therapy: Secondary | ICD-10-CM | POA: Insufficient documentation

## 2012-12-23 DIAGNOSIS — I10 Essential (primary) hypertension: Secondary | ICD-10-CM | POA: Insufficient documentation

## 2012-12-23 DIAGNOSIS — J45901 Unspecified asthma with (acute) exacerbation: Secondary | ICD-10-CM

## 2012-12-23 DIAGNOSIS — Z87891 Personal history of nicotine dependence: Secondary | ICD-10-CM | POA: Insufficient documentation

## 2012-12-23 NOTE — ED Notes (Addendum)
Per EMS, pt has been having difficulty all day and has been taking her home meds including her nebs. Her chest became tight and pt called EMS. Pt decreased in all lung fields. Pt had 10mg  Albuterol, 1 mg Atrovent, and 125 mg Solumedrol enroute.

## 2012-12-24 MED ORDER — PREDNISONE 20 MG PO TABS: 40.0000 mg | ORAL_TABLET | Freq: Every day | ORAL | Status: DC

## 2012-12-24 NOTE — ED Provider Notes (Signed)
57 year old female with history of asthma comes in with worsening wheezing and difficulty breathing. She received albuterol with Atrovent and now feels like she is back to baseline. On exam, following treatments, her lungs are completely clear.  Medical screening examination/treatment/procedure(s) were performed by non-physician practitioner and as supervising physician I was immediately available for consultation/collaboration.  Dione Booze, MD 12/24/12 [phone]

## 2012-12-24 NOTE — ED Provider Notes (Signed)
History    CSN: [id number] Arrival date & time 12/23/12  2331  First MD Initiated Contact with Patient 12/23/12 2351     Chief Complaint  Patient presents with  . Asthma   (Consider location/radiation/quality/duration/timing/severity/associated sxs/prior Treatment) HPI  [name] White is a 57 y.o. female S. medical history significant for asthma (no history of intubations) complaining of asthma exacerbation onset this a.m. Patient has given herself a home nebulizer treatment, use her albuterol inhaler 3 times, compliant with Qvar and Symbicort. Provided by EMS patient was given CAT, Solu-Medrol via IV. She denies any fever, sputum. Patient states that her triggers are allergens or worse in the summertime and hot air. She states that she has been outside more than normal over the last 2 days.   Past Medical History  Diagnosis Date  . Asthma   . Hypertension   . Thyroid disease   . Osteoarthritis   . GERD (gastroesophageal reflux disease)    Past Surgical History  Procedure Laterality Date  . Abdominal hysterectomy    . Cystectomy    . Mouth surgery    . Rectal surgery    . Thyroidectomy, partial  2005    Rt side   Family History  Problem Relation Age of Onset  . Osteoarthritis Mother   . Asthma Mother   . Heart failure Mother    History  Substance Use Topics  . Smoking status: Former Games developer  . Smokeless tobacco: Not on file  . Alcohol Use: No   OB History   Grav Para Term Preterm Abortions TAB SAB Ect Mult Living                 Review of Systems  Constitutional:       Negative except as described in HPI  HENT:       Negative except as described in HPI  Respiratory:       Negative except as described in HPI  Cardiovascular:       Negative except as described in HPI  Gastrointestinal:       Negative except as described in HPI  Genitourinary:       Negative except as described in HPI  Musculoskeletal:       Negative except as described in HPI  Skin:        Negative except as described in HPI  Neurological:       Negative except as described in HPI  All other systems reviewed and are negative.    Allergies  Caffeine; Ciprofloxacin; Vicodin; Erythromycin; and Sulfamethoxazole w-trimethoprim  Home Medications   Current Outpatient Rx  Name  Route  Sig  Dispense  Refill  . albuterol (PROVENTIL HFA;VENTOLIN HFA) 108 (90 BASE) MCG/ACT inhaler   Inhalation   Inhale 2 puffs into the lungs every 6 (six) hours as needed. For shortness of breath         . albuterol (PROVENTIL) (2.5 MG/3ML) 0.083% nebulizer solution   Nebulization   Take 2.5 mg by nebulization every 6 (six) hours as needed. For shortness of breath         . aspirin-acetaminophen-caffeine (BAYER MIGRAINE) 250-250-65 MG per tablet   Oral   Take 1-2 tablets by mouth every 6 (six) hours as needed. For pain         . azelastine (OPTIVAR) 0.05 % ophthalmic solution   Both Eyes   Place 1 drop into both eyes 2 (two) times daily as needed. For dry/irritated eyes          .  beclomethasone (QVAR) 80 MCG/ACT inhaler   Inhalation   Inhale 2 puffs into the lungs 2 (two) times daily.          . budesonide-formoterol (SYMBICORT) 160-4.5 MCG/ACT inhaler   Inhalation   Inhale 2 puffs into the lungs 2 (two) times daily.           . cetirizine (ZYRTEC) 10 MG tablet   Oral   Take 10 mg by mouth every morning.          Marland Kitchen Dexlansoprazole 30 MG capsule   Oral   Take 30 mg by mouth every morning.          . diclofenac sodium (VOLTAREN) 1 % GEL   Topical   Apply 1 application topically 2 (two) times daily as needed. On knees for pain/inflamation          . DULoxetine (CYMBALTA) 60 MG capsule   Oral   Take 60 mg by mouth at bedtime.          Marland Kitchen EXPIRED: estradiol (CLIMARA - DOSED IN MG/24 HR) 0.025 mg/24hr   Transdermal   Place 1 patch onto the skin every 7 (seven) days. On thursdays          . estradiol (CLIMARA - DOSED IN MG/24 HR) 0.025 mg/24hr       apply 1 patch TO SKIN EVERY 7 DAYS   4 patch   11   . hydrOXYzine (ATARAX/VISTARIL) 50 MG tablet   Oral   Take 50 mg by mouth daily.          . meloxicam (MOBIC) 15 MG tablet   Oral   Take 1 tablet (15 mg total) by mouth daily.   15 tablet   0   . methocarbamol (ROBAXIN) 500 MG tablet   Oral   Take 1 tablet (500 mg total) by mouth 3 (three) times daily.   30 tablet   0   . metoprolol-hydrochlorothiazide (LOPRESSOR HCT) 50-25 MG per tablet   Oral   Take 1 tablet by mouth at bedtime.          . metoprolol-hydrochlorothiazide (LOPRESSOR HCT) 50-25 MG per tablet      take 1 tablet by mouth daily   30 tablet   0   . metoprolol-hydrochlorothiazide (LOPRESSOR HCT) 50-25 MG per tablet      take 1 tablet by mouth daily   30 tablet   5   . Olopatadine HCl 0.6 % SOLN   Nasal   Place 1 puff into the nose 2 (two) times daily.           . pantoprazole (PROTONIX) 40 MG tablet   Oral   Take 1 tablet (40 mg total) by mouth daily.   30 tablet   1   . predniSONE (DELTASONE) 20 MG tablet   Oral   Take 2 tablets (40 mg total) by mouth daily.   10 tablet   0   . ranitidine (ZANTAC) 300 MG tablet   Oral   Take 300 mg by mouth at bedtime.          . traMADol (ULTRAM) 50 MG tablet   Oral   Take 2 tablets (100 mg total) by mouth every 8 (eight) hours as needed for pain.   30 tablet   0   . traZODone (DESYREL) 50 MG tablet   Oral   Take 50 mg by mouth at bedtime.           . verapamil (CALAN)  80 MG tablet   Oral   Take 80 mg by mouth 3 (three) times daily.         . vitamin C (ASCORBIC ACID) 500 MG tablet   Oral   Take 500 mg by mouth every morning.          BP 126/92  Pulse 92  Temp(Src) 97.9 F (36.6 C) (Oral)  Resp 20  SpO2 97% Physical Exam  Nursing note and vitals reviewed. Constitutional: She is oriented to person, place, and time. She appears well-developed and well-nourished. No distress.  Obese  HENT:  Head: Normocephalic.   Mouth/Throat: Oropharynx is clear and moist.  Eyes: Conjunctivae and EOM are normal. Pupils are equal, round, and reactive to light.  Neck: Normal range of motion.  Cardiovascular: Normal rate.   Pulmonary/Chest: Effort normal and breath sounds normal. No stridor. No respiratory distress. She has no wheezes. She has no rales. She exhibits no tenderness.  Pt is reclining, no stridor, speaking in complete sentences. No wheezing, expiration is slightly prolonged, moving excellent air in all fields.  Abdominal: Soft. Bowel sounds are normal. She exhibits no distension and no mass. There is no tenderness. There is no rebound and no guarding.  Musculoskeletal: Normal range of motion. She exhibits no edema and no tenderness.  Neurological: She is alert and oriented to person, place, and time.  Psychiatric: She has a normal mood and affect.    ED Course  Procedures (including critical care time) Labs Reviewed - No data to display No results found. 1. Asthma exacerbation, unspecified asthma severity     MDM   Filed Vitals:   12/23/12 2336 12/23/12 2348  BP:  126/92  Pulse:  92  Temp:  97.9 F (36.6 C)  TempSrc:  Oral  Resp:  20  SpO2: 97% 97%     [name] White is a 57 y.o. female with asthma attack, resolved after 10 mg of albuterol, IV Solu-Medrol and Atrovent. We'll start patient on steroid burst.  This is a shared visit with the attending physician who personally evaluated the patient and agrees with the care plan.   Pt is hemodynamically stable, appropriate for, and amenable to discharge at this time. Pt verbalized understanding and agrees with care plan. Outpatient follow-up and specific return precautions discussed.    New Prescriptions   PREDNISONE (DELTASONE) 20 MG TABLET    Take 2 tablets (40 mg total) by mouth daily.     Wynetta Emery, PA-C 12/24/12 0022

## 2012-12-24 NOTE — ED Provider Notes (Signed)
Medical screening examination/treatment/procedure(s) were performed by non-physician practitioner and as supervising physician I was immediately available for consultation/collaboration.   Kewana Sanon, MD 12/24/12 0626 

## 2013-02-01 ENCOUNTER — Other Ambulatory Visit: Payer: Self-pay | Admitting: Family Medicine

## 2013-02-01 DIAGNOSIS — I10 Essential (primary) hypertension: Secondary | ICD-10-CM

## 2013-02-01 MED ORDER — VERAPAMIL HCL 80 MG PO TABS: 80.0000 mg | ORAL_TABLET | Freq: Three times a day (TID) | ORAL | Status: DC

## 2013-03-17 ENCOUNTER — Encounter (HOSPITAL_COMMUNITY): Payer: Self-pay | Admitting: Radiology

## 2013-03-17 ENCOUNTER — Emergency Department (HOSPITAL_COMMUNITY)
Admission: EM | Admit: 2013-03-17 | Discharge: 2013-03-17 | Disposition: A | Discharge status: Home / Self Care | Payer: Medicare Other | Attending: Emergency Medicine | Admitting: Emergency Medicine

## 2013-03-17 DIAGNOSIS — Z87891 Personal history of nicotine dependence: Secondary | ICD-10-CM | POA: Insufficient documentation

## 2013-03-17 DIAGNOSIS — E079 Disorder of thyroid, unspecified: Secondary | ICD-10-CM | POA: Insufficient documentation

## 2013-03-17 DIAGNOSIS — R0602 Shortness of breath: Secondary | ICD-10-CM | POA: Insufficient documentation

## 2013-03-17 DIAGNOSIS — Z79899 Other long term (current) drug therapy: Secondary | ICD-10-CM | POA: Insufficient documentation

## 2013-03-17 DIAGNOSIS — Z8739 Personal history of other diseases of the musculoskeletal system and connective tissue: Secondary | ICD-10-CM | POA: Insufficient documentation

## 2013-03-17 DIAGNOSIS — I10 Essential (primary) hypertension: Secondary | ICD-10-CM | POA: Insufficient documentation

## 2013-03-17 DIAGNOSIS — K219 Gastro-esophageal reflux disease without esophagitis: Secondary | ICD-10-CM | POA: Insufficient documentation

## 2013-03-17 DIAGNOSIS — J45901 Unspecified asthma with (acute) exacerbation: Secondary | ICD-10-CM | POA: Insufficient documentation

## 2013-03-17 MED ORDER — PREDNISONE 20 MG PO TABS: 40.0000 mg | ORAL_TABLET | Freq: Every day | ORAL | Status: DC

## 2013-03-17 NOTE — ED Provider Notes (Signed)
CSN: [id number]     Arrival date & time 03/17/13  1037 History   First MD Initiated Contact with Patient 03/17/13 1040     Chief Complaint  Patient presents with  . Asthma   (Consider location/radiation/quality/duration/timing/severity/associated sxs/prior Treatment) HPI [name] White is a 57 y.o. female who presents to emergency department with complaint of an asthma exacerbation. Patient states that she was on her way to church when she began feeling short of breath. States that she felt like her airway with tightening and she felt wheezy. States she had her albuterol inhaler in her purse and she did take 2 puffs which did not help. States that she was on her way to church with her pastor and they called EMS because she was wheezing. Patient states that on the EMS they gave her 125 mg of Solu-Medrol per IV and gave her a breathing treatment. Patient states that it made her feel better and now she has no longer has any symptoms. Patient states that she normally takes QVAR and Symbicort daily for her asthma which she did take this morning. Patient states that currently she has no symptoms. She denies any current shortness of breath, chest pain, cough. He has not had any recent illnesses. She denies any recent travel or surgeries. No LE swelling.    Past Medical History  Diagnosis Date  . Asthma   . Hypertension   . Thyroid disease   . Osteoarthritis   . GERD (gastroesophageal reflux disease)    Past Surgical History  Procedure Laterality Date  . Abdominal hysterectomy    . Cystectomy    . Mouth surgery    . Rectal surgery    . Thyroidectomy, partial  2005    Rt side   Family History  Problem Relation Age of Onset  . Osteoarthritis Mother   . Asthma Mother   . Heart failure Mother    History  Substance Use Topics  . Smoking status: Former Games developer  . Smokeless tobacco: Not on file  . Alcohol Use: No   OB History   Grav Para Term Preterm Abortions TAB SAB Ect Mult Living                Review of Systems  Constitutional: Negative for fever and chills.  HENT: Negative for neck pain and neck stiffness.   Respiratory: Positive for shortness of breath and wheezing. Negative for cough and chest tightness.   Cardiovascular: Negative for chest pain, palpitations and leg swelling.  Gastrointestinal: Negative for nausea, vomiting, abdominal pain and diarrhea.  Musculoskeletal: Negative for myalgias and arthralgias.  Skin: Negative for rash.  Neurological: Negative for dizziness, weakness and headaches.  All other systems reviewed and are negative.    Allergies  Caffeine; Ciprofloxacin; Lisinopril; Vicodin; Erythromycin; and Sulfamethoxazole w-trimethoprim  Home Medications   Current Outpatient Rx  Name  Route  Sig  Dispense  Refill  . albuterol (PROVENTIL HFA;VENTOLIN HFA) 108 (90 BASE) MCG/ACT inhaler   Inhalation   Inhale 2 puffs into the lungs every 6 (six) hours as needed. For shortness of breath         . albuterol (PROVENTIL) (2.5 MG/3ML) 0.083% nebulizer solution   Nebulization   Take 2.5 mg by nebulization every 6 (six) hours as needed. For shortness of breath         . aspirin-acetaminophen-caffeine (BAYER MIGRAINE) 250-250-65 MG per tablet   Oral   Take 1-2 tablets by mouth every 6 (six) hours as needed. For pain         .  azelastine (OPTIVAR) 0.05 % ophthalmic solution   Both Eyes   Place 1 drop into both eyes 2 (two) times daily as needed. For dry/irritated eyes          . beclomethasone (QVAR) 80 MCG/ACT inhaler   Inhalation   Inhale 2 puffs into the lungs 2 (two) times daily.          . budesonide-formoterol (SYMBICORT) 160-4.5 MCG/ACT inhaler   Inhalation   Inhale 2 puffs into the lungs 2 (two) times daily.           . cetirizine (ZYRTEC) 10 MG tablet   Oral   Take 10 mg by mouth every morning.          Marland Kitchen EXPIRED: Dexlansoprazole 30 MG capsule   Oral   Take 30 mg by mouth every morning.          . diclofenac  sodium (VOLTAREN) 1 % GEL   Topical   Apply 1 application topically 2 (two) times daily as needed. On knees for pain/inflamation          . DULoxetine (CYMBALTA) 60 MG capsule   Oral   Take 60 mg by mouth at bedtime.          Marland Kitchen EXPIRED: estradiol (CLIMARA - DOSED IN MG/24 HR) 0.025 mg/24hr   Transdermal   Place 1 patch onto the skin every 7 (seven) days. On thursdays          . estradiol (CLIMARA - DOSED IN MG/24 HR) 0.025 mg/24hr      apply 1 patch TO SKIN EVERY 7 DAYS   4 patch   11   . hydrOXYzine (ATARAX/VISTARIL) 50 MG tablet   Oral   Take 50 mg by mouth daily.          . meloxicam (MOBIC) 15 MG tablet   Oral   Take 1 tablet (15 mg total) by mouth daily.   15 tablet   0   . methocarbamol (ROBAXIN) 500 MG tablet   Oral   Take 1 tablet (500 mg total) by mouth 3 (three) times daily.   30 tablet   0   . metoprolol-hydrochlorothiazide (LOPRESSOR HCT) 50-25 MG per tablet   Oral   Take 1 tablet by mouth at bedtime.          . metoprolol-hydrochlorothiazide (LOPRESSOR HCT) 50-25 MG per tablet      take 1 tablet by mouth daily   30 tablet   5   . Olopatadine HCl 0.6 % SOLN   Nasal   Place 1 puff into the nose 2 (two) times daily.           . pantoprazole (PROTONIX) 40 MG tablet   Oral   Take 1 tablet (40 mg total) by mouth daily.   30 tablet   1   . predniSONE (DELTASONE) 20 MG tablet   Oral   Take 2 tablets (40 mg total) by mouth daily.   10 tablet   0   . ranitidine (ZANTAC) 300 MG tablet   Oral   Take 300 mg by mouth at bedtime.          . traMADol (ULTRAM) 50 MG tablet   Oral   Take 2 tablets (100 mg total) by mouth every 8 (eight) hours as needed for pain.   30 tablet   0   . traZODone (DESYREL) 50 MG tablet   Oral   Take 50 mg by mouth at bedtime.           Marland Kitchen  verapamil (CALAN) 80 MG tablet   Oral   Take 1 tablet (80 mg total) by mouth 3 (three) times daily.   180 tablet   0     Patient needs to make an appointment for  future re ...   . vitamin C (ASCORBIC ACID) 500 MG tablet   Oral   Take 500 mg by mouth every morning.          BP 111/47  Pulse 70  Temp(Src) 97.8 F (36.6 C) (Oral)  Resp 18  Ht 5\' 7"  (1.702 m)  Wt 288 lb (130.636 kg)  BMI 45.1 kg/m2  SpO2 96% Physical Exam  Nursing note and vitals reviewed. Constitutional: She appears well-developed and well-nourished. No distress.  HENT:  Head: Normocephalic.  Eyes: Conjunctivae are normal.  Neck: Neck supple.  Cardiovascular: Normal rate, regular rhythm and normal heart sounds.   Pulmonary/Chest: Effort normal and breath sounds normal. No respiratory distress. She has no wheezes. She has no rales.  Abdominal: Soft. Bowel sounds are normal. She exhibits no distension. There is no tenderness. There is no rebound.  Musculoskeletal: She exhibits no edema.  Neurological: She is alert.  Skin: Skin is warm and dry.  Psychiatric: She has a normal mood and affect. Her behavior is normal.    ED Course  Procedures (including critical care time) Labs Review Labs Reviewed - No data to display Imaging Review No results found.  MDM   1. Asthma attack      Patient tach while on the way to church. She has no cough, congestion, fever. Doubt pneumonia. She has not had any recent travel or surgeries, doubt PE. If she's not tachycardic or tachypnea. She has no chest pain. No history of coronary disease. She felt like it was her asthma, and her symptoms completely resolved with a duo neb treatments and Solu-Medrol per IV. At this time patient is symptom free and her lungs are clear. She is in no distress and wants to be discharged home. I will continue her on her inhalers, will add prednisone 40 mg for 5 days. Will followup with primary care doctor, patient has an appointment in 5 days with her asthma specialist.  Filed Vitals:   03/17/13 1040 03/17/13 1042  BP:  111/47  Pulse:  70  Temp:  97.8 F (36.6 C)  TempSrc:  Oral  Resp:  18  Height:   5\' 7"  (1.702 m)  Weight:  288 lb (130.636 kg)  SpO2: 95% 96%       Khalib Fendley A Mera Gunkel, PA-C 03/17/13 1609

## 2013-03-17 NOTE — ED Provider Notes (Signed)
Medical screening examination/treatment/procedure(s) were performed by non-physician practitioner and as supervising physician I was immediately available for consultation/collaboration.   Shanna Cisco, MD 03/17/13 1623

## 2013-03-17 NOTE — ED Notes (Addendum)
Pt presents with an "asthma attack" while at church. Pt was stridor upon ems arriva. Given 5mg  oaf albuterol and 0.5 mg of atrovent. 1245 mg solumederol and nsr with pvc on monitor

## 2013-03-29 ENCOUNTER — Other Ambulatory Visit: Payer: Self-pay | Admitting: Family Medicine

## 2013-04-02 ENCOUNTER — Telehealth: Payer: Self-pay | Admitting: Family Medicine

## 2013-04-02 ENCOUNTER — Other Ambulatory Visit: Payer: Self-pay | Admitting: Family Medicine

## 2013-04-02 DIAGNOSIS — I10 Essential (primary) hypertension: Secondary | ICD-10-CM

## 2013-04-02 MED ORDER — VERAPAMIL HCL 80 MG PO TABS: 80.0000 mg | ORAL_TABLET | Freq: Three times a day (TID) | ORAL | Status: DC

## 2013-04-02 NOTE — Telephone Encounter (Signed)
Please call pt and let her know I called her in a one-month prescription. She will need to make an appointment for all future prescriptions. She has a new PCP and I would like to meet her before prescribing her future medications she is also overdue for her yearly exam. Thank you

## 2013-04-02 NOTE — Telephone Encounter (Signed)
LMOM advising pt of Dr Alan Ripper msg

## 2013-04-06 ENCOUNTER — Emergency Department (HOSPITAL_COMMUNITY): Payer: Medicare Other

## 2013-04-06 ENCOUNTER — Inpatient Hospital Stay (HOSPITAL_COMMUNITY)
Admission: EM | Admit: 2013-04-06 | Discharge: 2013-04-07 | DRG: 192 | Disposition: A | Discharge status: Home / Self Care | Payer: Medicare Other | Attending: Family Medicine | Admitting: Family Medicine

## 2013-04-06 ENCOUNTER — Encounter (HOSPITAL_COMMUNITY): Payer: Self-pay | Admitting: Emergency Medicine

## 2013-04-06 DIAGNOSIS — Z602 Problems related to living alone: Secondary | ICD-10-CM

## 2013-04-06 DIAGNOSIS — R51 Headache: Secondary | ICD-10-CM

## 2013-04-06 DIAGNOSIS — G2581 Restless legs syndrome: Secondary | ICD-10-CM | POA: Diagnosis present

## 2013-04-06 DIAGNOSIS — R0603 Acute respiratory distress: Secondary | ICD-10-CM

## 2013-04-06 DIAGNOSIS — F411 Generalized anxiety disorder: Secondary | ICD-10-CM | POA: Diagnosis present

## 2013-04-06 DIAGNOSIS — F329 Major depressive disorder, single episode, unspecified: Secondary | ICD-10-CM | POA: Diagnosis present

## 2013-04-06 DIAGNOSIS — I10 Essential (primary) hypertension: Secondary | ICD-10-CM | POA: Diagnosis present

## 2013-04-06 DIAGNOSIS — Z825 Family history of asthma and other chronic lower respiratory diseases: Secondary | ICD-10-CM

## 2013-04-06 DIAGNOSIS — Z87891 Personal history of nicotine dependence: Secondary | ICD-10-CM

## 2013-04-06 DIAGNOSIS — J441 Chronic obstructive pulmonary disease with (acute) exacerbation: Principal | ICD-10-CM | POA: Diagnosis present

## 2013-04-06 DIAGNOSIS — J309 Allergic rhinitis, unspecified: Secondary | ICD-10-CM | POA: Diagnosis present

## 2013-04-06 DIAGNOSIS — Z881 Allergy status to other antibiotic agents status: Secondary | ICD-10-CM

## 2013-04-06 DIAGNOSIS — J45901 Unspecified asthma with (acute) exacerbation: Secondary | ICD-10-CM

## 2013-04-06 DIAGNOSIS — Z8261 Family history of arthritis: Secondary | ICD-10-CM

## 2013-04-06 DIAGNOSIS — Z8249 Family history of ischemic heart disease and other diseases of the circulatory system: Secondary | ICD-10-CM

## 2013-04-06 DIAGNOSIS — Z23 Encounter for immunization: Secondary | ICD-10-CM

## 2013-04-06 DIAGNOSIS — E876 Hypokalemia: Secondary | ICD-10-CM | POA: Diagnosis present

## 2013-04-06 DIAGNOSIS — E039 Hypothyroidism, unspecified: Secondary | ICD-10-CM | POA: Diagnosis present

## 2013-04-06 DIAGNOSIS — F3289 Other specified depressive episodes: Secondary | ICD-10-CM | POA: Diagnosis present

## 2013-04-06 DIAGNOSIS — K219 Gastro-esophageal reflux disease without esophagitis: Secondary | ICD-10-CM | POA: Diagnosis present

## 2013-04-06 DIAGNOSIS — E669 Obesity, unspecified: Secondary | ICD-10-CM | POA: Diagnosis present

## 2013-04-06 DIAGNOSIS — Z888 Allergy status to other drugs, medicaments and biological substances status: Secondary | ICD-10-CM

## 2013-04-06 DIAGNOSIS — J45909 Unspecified asthma, uncomplicated: Secondary | ICD-10-CM | POA: Diagnosis present

## 2013-04-06 HISTORY — DX: Anxiety disorder, unspecified: F41.9

## 2013-04-06 HISTORY — DX: Major depressive disorder, single episode, unspecified: F32.9

## 2013-04-06 HISTORY — DX: Depression, unspecified: F32.A

## 2013-04-06 LAB — BLOOD GAS, ARTERIAL
Acid-Base Excess: 3 mmol/L — ABNORMAL HIGH (ref 0.0–2.0)
Delivery systems: POSITIVE
Drawn by: 310571
Inspiratory PAP: 10
pCO2 arterial: 32.5 mmHg — ABNORMAL LOW (ref 35.0–45.0)

## 2013-04-06 LAB — COMPREHENSIVE METABOLIC PANEL
ALT: 16 U/L (ref 0–35)
AST: 18 U/L (ref 0–37)
Calcium: 9.2 mg/dL (ref 8.4–10.5)
GFR calc Af Amer: >90 mL/min (ref >90)
Glucose, Bld: 117 mg/dL — ABNORMAL HIGH (ref 70–99)
Sodium: 139 mEq/L (ref 135–145)
Total Protein: 8.4 g/dL — ABNORMAL HIGH (ref 6.0–8.3)

## 2013-04-06 LAB — CBC WITH DIFFERENTIAL/PLATELET
Basophils Absolute: 0 10*3/uL (ref 0.0–0.1)
Basophils Relative: 0 % (ref 0–1)
Eosinophils Absolute: 0.3 10*3/uL (ref 0.0–0.7)
Eosinophils Relative: 3 % (ref 0–5)
MCH: 30.7 pg (ref 26.0–34.0)
MCV: 91.6 fL (ref 78.0–100.0)
Platelets: 409 10*3/uL — ABNORMAL HIGH (ref 150–400)
RDW: 15.9 % — ABNORMAL HIGH (ref 11.5–15.5)

## 2013-04-06 MED ORDER — MAGNESIUM SULFATE 40 MG/ML IJ SOLN
2.0000 g | Freq: Once | INTRAMUSCULAR | Status: AC
  Administered 2013-04-06: 2 g via INTRAVENOUS
  Filled 2013-04-06: qty 50

## 2013-04-06 MED ORDER — PANTOPRAZOLE SODIUM 40 MG PO TBEC
80.0000 mg | DELAYED_RELEASE_TABLET | Freq: Every day | ORAL | Status: DC
  Administered 2013-04-07: 80 mg via ORAL
  Filled 2013-04-06: qty 2

## 2013-04-06 MED ORDER — DICLOFENAC SODIUM 1 % TD GEL
2.0000 g | Freq: Two times a day (BID) | TRANSDERMAL | Status: DC | PRN
Start: 2013-04-06 — End: 2013-04-08

## 2013-04-06 MED ORDER — ALBUTEROL (5 MG/ML) CONTINUOUS INHALATION SOLN
15.0000 mg/h | INHALATION_SOLUTION | RESPIRATORY_TRACT | Status: DC
  Administered 2013-04-06: 15 mg/h via RESPIRATORY_TRACT
  Filled 2013-04-06: qty 20

## 2013-04-06 MED ORDER — VITAMIN E 45 MG (100 UNIT) PO CAPS
100.0000 [IU] | ORAL_CAPSULE | Freq: Every day | ORAL | Status: DC
  Administered 2013-04-07: 100 [IU] via ORAL
  Filled 2013-04-06: qty 1

## 2013-04-06 MED ORDER — METHYLPREDNISOLONE SODIUM SUCC 125 MG IJ SOLR
INTRAMUSCULAR | Status: AC
  Administered 2013-04-06: 125 mg
  Filled 2013-04-06: qty 2

## 2013-04-06 MED ORDER — FAMOTIDINE 40 MG PO TABS
40.0000 mg | ORAL_TABLET | Freq: Every day | ORAL | Status: DC
  Administered 2013-04-07: 40 mg via ORAL
  Filled 2013-04-06 (×2): qty 1

## 2013-04-06 MED ORDER — METOPROLOL TARTRATE 50 MG PO TABS
50.0000 mg | ORAL_TABLET | Freq: Every day | ORAL | Status: DC
  Administered 2013-04-07: 50 mg via ORAL
  Filled 2013-04-06 (×2): qty 1

## 2013-04-06 MED ORDER — BUDESONIDE-FORMOTEROL FUMARATE 160-4.5 MCG/ACT IN AERO
2.0000 | INHALATION_SPRAY | Freq: Two times a day (BID) | RESPIRATORY_TRACT | Status: DC
  Administered 2013-04-07 (×2): 2 via RESPIRATORY_TRACT
  Filled 2013-04-06: qty 6

## 2013-04-06 MED ORDER — LORATADINE 10 MG PO TABS
10.0000 mg | ORAL_TABLET | Freq: Every day | ORAL | Status: DC
  Administered 2013-04-07: 10 mg via ORAL
  Filled 2013-04-06: qty 1

## 2013-04-06 MED ORDER — AZELASTINE HCL 0.1 % NA SOLN
1.0000 | Freq: Every day | NASAL | Status: DC
  Administered 2013-04-07: 1 via NASAL
  Filled 2013-04-06: qty 30

## 2013-04-06 MED ORDER — SODIUM CHLORIDE 0.45 % IV SOLN
INTRAVENOUS | Status: AC
  Administered 2013-04-06: 23:00:00 via INTRAVENOUS

## 2013-04-06 MED ORDER — PREDNISONE 50 MG PO TABS
50.0000 mg | ORAL_TABLET | Freq: Every day | ORAL | Status: DC
  Administered 2013-04-07: 50 mg via ORAL
  Filled 2013-04-06 (×2): qty 1

## 2013-04-06 MED ORDER — SODIUM CHLORIDE 0.9 % IV SOLN: INTRAVENOUS | Status: DC

## 2013-04-06 MED ORDER — ENOXAPARIN SODIUM 60 MG/0.6ML ~~LOC~~ SOLN
60.0000 mg | SUBCUTANEOUS | Status: DC
  Administered 2013-04-07: 10:00:00 60 mg via SUBCUTANEOUS
  Filled 2013-04-06: qty 0.6

## 2013-04-06 MED ORDER — HYDROCHLOROTHIAZIDE 25 MG PO TABS
25.0000 mg | ORAL_TABLET | Freq: Every day | ORAL | Status: DC
  Administered 2013-04-07: 25 mg via ORAL
  Filled 2013-04-06 (×2): qty 1

## 2013-04-06 MED ORDER — DIPHENHYDRAMINE HCL 25 MG PO CAPS: 50.0000 mg | ORAL_CAPSULE | Freq: Four times a day (QID) | ORAL | Status: DC | PRN

## 2013-04-06 MED ORDER — FLUTICASONE PROPIONATE HFA 44 MCG/ACT IN AERO
2.0000 | INHALATION_SPRAY | Freq: Two times a day (BID) | RESPIRATORY_TRACT | Status: DC
  Administered 2013-04-07: 2 via RESPIRATORY_TRACT
  Filled 2013-04-06: qty 10.6

## 2013-04-06 MED ORDER — ALBUTEROL SULFATE (5 MG/ML) 0.5% IN NEBU
INHALATION_SOLUTION | RESPIRATORY_TRACT | Status: AC
  Administered 2013-04-06: 5 mg via RESPIRATORY_TRACT
  Filled 2013-04-06: qty 1

## 2013-04-06 MED ORDER — IPRATROPIUM BROMIDE 0.02 % IN SOLN
1.0000 mg | RESPIRATORY_TRACT | Status: AC
  Administered 2013-04-06: 1 mg via RESPIRATORY_TRACT
  Filled 2013-04-06: qty 5

## 2013-04-06 MED ORDER — HYDROXYZINE HCL 50 MG PO TABS
50.0000 mg | ORAL_TABLET | Freq: Every day | ORAL | Status: DC
  Administered 2013-04-07: 50 mg via ORAL
  Filled 2013-04-06 (×2): qty 1

## 2013-04-06 MED ORDER — ALBUTEROL SULFATE (5 MG/ML) 0.5% IN NEBU: 2.5000 mg | INHALATION_SOLUTION | RESPIRATORY_TRACT | Status: DC | PRN

## 2013-04-06 MED ORDER — POTASSIUM CHLORIDE CRYS ER 20 MEQ PO TBCR
40.0000 meq | EXTENDED_RELEASE_TABLET | Freq: Once | ORAL | Status: AC
  Administered 2013-04-07: 40 meq via ORAL
  Filled 2013-04-06: qty 2

## 2013-04-06 MED ORDER — VITAMIN C 500 MG PO TABS
500.0000 mg | ORAL_TABLET | Freq: Every morning | ORAL | Status: DC
  Administered 2013-04-07: 500 mg via ORAL
  Filled 2013-04-06: qty 1

## 2013-04-06 MED ORDER — METOPROLOL-HYDROCHLOROTHIAZIDE 50-25 MG PO TABS: 1.0000 | ORAL_TABLET | Freq: Every day | ORAL | Status: DC

## 2013-04-06 MED ORDER — KETOTIFEN FUMARATE 0.025 % OP SOLN: 1.0000 [drp] | Freq: Two times a day (BID) | OPHTHALMIC | Status: DC | PRN

## 2013-04-06 MED ORDER — TRAZODONE HCL 50 MG PO TABS
25.0000 mg | ORAL_TABLET | Freq: Two times a day (BID) | ORAL | Status: DC | PRN
  Filled 2013-04-06: qty 1

## 2013-04-06 MED ORDER — VERAPAMIL HCL 80 MG PO TABS
80.0000 mg | ORAL_TABLET | Freq: Three times a day (TID) | ORAL | Status: DC
  Administered 2013-04-07 (×3): 80 mg via ORAL
  Filled 2013-04-06 (×4): qty 1

## 2013-04-06 MED ORDER — DULOXETINE HCL 60 MG PO CPEP
60.0000 mg | ORAL_CAPSULE | Freq: Every day | ORAL | Status: DC
  Administered 2013-04-07: 60 mg via ORAL
  Filled 2013-04-06 (×2): qty 1

## 2013-04-06 MED ORDER — SODIUM CHLORIDE 0.9 % IJ SOLN
3.0000 mL | Freq: Two times a day (BID) | INTRAMUSCULAR | Status: DC
  Administered 2013-04-07: 3 mL via INTRAVENOUS

## 2013-04-06 MED ORDER — ALBUTEROL SULFATE (5 MG/ML) 0.5% IN NEBU
2.5000 mg | INHALATION_SOLUTION | RESPIRATORY_TRACT | Status: DC
  Administered 2013-04-06: 2.5 mg via RESPIRATORY_TRACT
  Filled 2013-04-06: qty 0.5

## 2013-04-06 NOTE — ED Notes (Signed)
Respiratory at bedside with pt. Pt on bipap.

## 2013-04-06 NOTE — ED Notes (Signed)
[facility] called for transport. 

## 2013-04-06 NOTE — H&P (Signed)
Family Medicine Teaching [facility] Admission History and Physical Service Pager: [phone]  Patient name: [name] White Medical record number: [id number] Date of birth: Feb 27, 1956 Age: 57 y.o. Gender: female  Primary Care Provider: Felix Pacini, DO Consultants: None Code Status: Full Code per discussion  Chief Complaint: shortness of breath  Assessment and Plan: [name] White is a 57 y.o. female presenting with acute shortness of breath likely in an asthma exacerbation. PMH is significant for persistent asthma, depression, obesity, hypothyroidism, HTN, and GERD.  # Respiratory distress - Currently resolved. Likely asthma exacerbation given presenting symptoms, ABG pH 7.5 suggesting retaining CO2. No leukocytosis, no documented fever, CXR with no acute disease. Upon examination at [facility], patient is stable, speaking in sentences, with good air movement throughout and no wheezes or tachypnea s/p CAT and BiPAP each ~1 hour, Mg, and IV solu-medrol. - Per [name] assessment, admitting to stepdown unit overnight, likely transfer to floor in AM, attending Dr. Randolm Idol - Continue albuterol overnight Q2/Q1 prn, space in AM if doing better - Prednisone 50mg  PO x 4 more days starting tomorrow - Continued formulary alternative of QVAR (flovent HFA) 2 puffs but increased from daily to BID, symbicort 2 puffs BID, antihistamine PO - O2 PRN, currently on 15L/min nonrebreather satting 100%; Transition to [place] as tolerates, then can order diet - With frequent exacerbations recently, consider stepping up therapy - AM ABG, CBC, BMET - Flu shot and asthma action plan prior to discharge  # Tachycardia - likely secondary to CAT - EKG at [facility] initially with sinus tach 130sbpm with baseline wander, repeat EKGs NSR - Monitor  # Hypokalemia - K mildly low at 3.3 likely due to albuterol  - Dose kdur x 2 when able to PO - AM BMET  # HTN - Stable - Continue home verapamil 80mg  TID, lopressor-HCT 50-25mg   QHS,  - Monitor  # Incidental finding on CXR of fullness in right hilar region - Repeat CXR in 2-3 weeks   # GERD - Continue formulary alternative of home PPI and home H2 blocker  # Anxiety/depression - Continue home cymbalta DR 60mg  QHS, hydroxyzine 50mg  QHS and trazodone 25mg  BID and QHS PRN for sleep  FEN/GI:  NPO until off nonrebreather, 1/2 NS at 75cc/hr x 12 hours Prophylaxis: Lovenox  Disposition: Admitted to SDU tonight given acuity of symptoms, transfer to floor in AM if doing better - Discharge pending clinical improvement - Flu shot prior to discharge   History of Present Illness: [name] White is a 57 y.o. female presenting with acute shortness of breath likely in an acute asthma exacerbation.  Patient reports SOB and chest tightness since yesterday morning. Yesterday afternoon/night, she used her albuterol and went to bed early to try and relieve symptoms. This morning she still felt poorly. She also realized her nebulizer was not blowing medicine out properly. She took her normal medications (symbicort 2 puffs BID, QVAR 2 puffs mid-day) and tried her albuterol inhaler, with no relief. She had a headache and some dizziness this morning and felt the need to come in. Triggers typically include weather changes, heat, stress, and dust mites. She has felt the weather change in the past week and had been needing albuterol 2x/day for the entire week (when she usually does not even need it weekly). 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 She denies fevers, chills, chest pain, nausea, vomiting, diarrhea, abdominal pain, rash, or syncope. Denies known sick  contacts or recent medication changes.   Her asthma history includes multiple ED visits but only 2 admissions since diagnosis in 2006 (April 2006, today). She has never been intubated.  When seen by [name] at [facility], patient was gasping for air, speaking in 1-2 word sentences,  and having trouble breathing. He placed her on BiPAP and continuous albuterol (each for 1 hour), and dosed atrovent 1mg , solu-medrol 125mg  IV, and magnesium sulfate 2g IV. ABG on bipap was relatively normal. Portable chest x-ray showed no acute abnormalities. Patient reports feeling "so much better" after these treatments.   Review Of Systems: Per HPI with the following additions:  Otherwise 12 point review of systems was performed and was unremarkable.  Patient Active Problem List   Diagnosis Date Noted  . Asthma exacerbation 04/06/2013  . Mixed headache 01/06/2012  . Hypothyroidism 11/01/2011  . Hot flashes 04/22/2011  . Fatigue 04/21/2011  . OVERACTIVE BLADDER 04/18/2008  . OSTEOARTHRITIS, KNEES, BILATERAL 04/18/2008  . MIGRAINES, HX OF 01/04/2008  . POLYARTHRITIS 09/14/2007  . OBESITY, NOS 08/24/2006  . DEPRESSIVE DISORDER, NOS 08/24/2006  . RESTLESS LEGS SYNDROME 08/24/2006  . HYPERTENSION, BENIGN SYSTEMIC 08/24/2006  . RHINITIS, ALLERGIC 08/24/2006  . ASTHMA, PERSISTENT 08/24/2006  . GASTROESOPHAGEAL REFLUX, NO ESOPHAGITIS 08/24/2006   Past Medical History: Past Medical History  Diagnosis Date  . Asthma   . Hypertension   . Thyroid disease   . Osteoarthritis   . GERD (gastroesophageal reflux disease)   . Anxiety   . Depression   . GNFAOZHY(865.7)    Past Surgical History: Past Surgical History  Procedure Laterality Date  . Abdominal hysterectomy    . Cystectomy    . Mouth surgery    . Rectal surgery    . Thyroidectomy, partial  2005    Rt side  . Nasal sinus surgery  2007   Social History: History  Substance Use Topics  . Smoking status: Former Smoker -- 0.25 packs/day for 20 years    Types: Cigarettes    Quit date: 07/16/2012  . Smokeless tobacco: Not on file  . Alcohol Use: No  No known sick contacts [employer] online Lives alone Quit smoking 13 years No etoh or drug use Works part-time for [employer]  Additional social history. Please  also refer to relevant sections of EMR.  Family History: Family History  Problem Relation Age of Onset  . Osteoarthritis Mother   . Asthma Mother   . Heart failure Mother    Allergies and Medications: Allergies  Allergen Reactions  . Caffeine Other (See Comments)    Migraine  . Ciprofloxacin Hives  . Lisinopril Cough  . Vicodin [Hydrocodone-Acetaminophen] Nausea And Vomiting and Other (See Comments)    Headache   . Erythromycin Rash  . Sulfamethoxazole-Trimethoprim Rash   No current facility-administered medications on file prior to encounter.   Current Outpatient Prescriptions on File Prior to Encounter  Medication Sig Dispense Refill  . albuterol (PROVENTIL HFA;VENTOLIN HFA) 108 (90 BASE) MCG/ACT inhaler Inhale 2 puffs into the lungs every 6 (six) hours as needed. For shortness of breath      . albuterol (PROVENTIL) (2.5 MG/3ML) 0.083% nebulizer solution Take 2.5 mg by nebulization every 6 (six) hours as needed. For shortness of breath      . aspirin-acetaminophen-caffeine (BAYER MIGRAINE) 250-250-65 MG per tablet Take 1-2 tablets by mouth every 6 (six) hours as needed. For pain      . azelastine (OPTIVAR) 0.05 % ophthalmic solution Place 1 drop into both eyes 2 (  two) times daily as needed. For dry/irritated eyes       . beclomethasone (QVAR) 80 MCG/ACT inhaler Inhale 2 puffs into the lungs daily.       . budesonide-formoterol (SYMBICORT) 160-4.5 MCG/ACT inhaler Inhale 2 puffs into the lungs 2 (two) times daily.        . diclofenac sodium (VOLTAREN) 1 % GEL Apply 1 application topically 2 (two) times daily as needed. On knees for pain/inflamation       . DULoxetine (CYMBALTA) 60 MG capsule Take 60 mg by mouth at bedtime.       Marland Kitchen estradiol (CLIMARA - DOSED IN MG/24 HR) 0.025 mg/24hr patch Place 1 patch onto the skin once a week. On Friday      . hydrOXYzine (ATARAX/VISTARIL) 50 MG tablet Take 50 mg by mouth at bedtime.       . metoprolol-hydrochlorothiazide (LOPRESSOR HCT) 50-25  MG per tablet Take 1 tablet by mouth at bedtime.       . Olopatadine HCl 0.6 % SOLN Place 1 puff into the nose at bedtime.       . ranitidine (ZANTAC) 300 MG tablet Take 300 mg by mouth at bedtime.       . traZODone (DESYREL) 50 MG tablet Take 25 mg by mouth 3 times/day as needed-between meals & bedtime for sleep.       . verapamil (CALAN) 80 MG tablet Take 1 tablet (80 mg total) by mouth 3 (three) times daily.  90 tablet  0  . vitamin C (ASCORBIC ACID) 500 MG tablet Take 500 mg by mouth every morning.        Objective: BP 117/88  Pulse 139  Resp 15  SpO2 100% Exam: General: NAD, pleasant, nonrebreather in place HEENT: AT/[place], sclera clear, EOMI, somewhat poor dentition Cardiovascular: RRR, no m/r/g Respiratory: CTAB with no wheezes and good air movement, normal effort, speaking in normal-length sentences Abdomen: Soft, nontender, nondistended, obese Extremities: No pitting edema, no erythema or asymmetry Skin: No rash or cyanosis Neuro: Awake, alert, no focal deficits, normal speech, normal tone, moves all extremities spontaneously with full ROM  Labs and Imaging: CBC BMET   Recent Labs Lab 04/06/13 1740  WBC 9.2  HGB 14.3  HCT 42.7  PLT 409*    Recent Labs Lab 04/06/13 1740  NA 139  K 3.3*  CL 100  CO2 28  BUN 10  CREATININE 0.63  GLUCOSE 117*  CALCIUM 9.2     DG Chest Portable 1 view: IMPRESSION:  1. No radiographic evidence of acute cardiopulmonary disease.  2. Fullness of soft tissues in the right hilar region. This may  simply be projectional (as the patient is rotated to the left).  However, if there is any clinical concern for malignancy, repeat  evaluation with standing PA and lateral chest radiograph is highly  recommended. Regardless, a followup PA and lateral chest radiograph  within the next 2-3 weeks is recommended to ensure stability or  resolution of this finding  EKG NSR  ABG pH 7.503, pCO2 32.5, pO2 268, bicarbonate 25.3, TCO2  21.9   Leona Singleton, MD 04/06/2013, 10:57 PM PGY-2, [facility] FPTS Intern pager: [phone], text pages welcome

## 2013-04-06 NOTE — ED Notes (Signed)
Pt reports to ED with chronic asthma. Pt called friend to pick her up to carry to ED d/t increasing SOB.

## 2013-04-06 NOTE — ED Notes (Signed)
[name], Silverio Lay request repeat EKG.

## 2013-04-06 NOTE — ED Notes (Signed)
Pt receiving bipap at present time. Pt asked if able to breathe easier. Pt gives thumbs up.

## 2013-04-06 NOTE — ED Provider Notes (Addendum)
CSN: [id number]     Arrival date & time 04/06/13  1721 History   First MD Initiated Contact with Patient 04/06/13 1733     Chief Complaint  Patient presents with  . Asthma   (Consider location/radiation/quality/duration/timing/severity/associated sxs/prior Treatment) The history is provided by the patient.  [name] White is a 57 y.o. female history of asthma, hypertension here presenting for shortness of breath. Acute onset of shortness breath today about 15 minutes prior to arrival. Denies any smoking or any smoke exposure. States that she gets this when he gets to humid outside. Denies any fevers or chills or cough. Had previous admissions for asthma but denies any intubations.  Level V caveat- condition of patient    Past Medical History  Diagnosis Date  . Asthma   . Hypertension   . Thyroid disease   . Osteoarthritis   . GERD (gastroesophageal reflux disease)    Past Surgical History  Procedure Laterality Date  . Abdominal hysterectomy    . Cystectomy    . Mouth surgery    . Rectal surgery    . Thyroidectomy, partial  2005    Rt side   Family History  Problem Relation Age of Onset  . Osteoarthritis Mother   . Asthma Mother   . Heart failure Mother    History  Substance Use Topics  . Smoking status: Former Games developer  . Smokeless tobacco: Not on file  . Alcohol Use: No   OB History   Grav Para Term Preterm Abortions TAB SAB Ect Mult Living                 Review of Systems  Respiratory: Positive for shortness of breath.   All other systems reviewed and are negative.    Allergies  Caffeine; Ciprofloxacin; Lisinopril; Vicodin; Erythromycin; and Sulfamethoxazole-trimethoprim  Home Medications   Current Outpatient Rx  Name  Route  Sig  Dispense  Refill  . albuterol (PROVENTIL HFA;VENTOLIN HFA) 108 (90 BASE) MCG/ACT inhaler   Inhalation   Inhale 2 puffs into the lungs every 6 (six) hours as needed. For shortness of breath         . albuterol  (PROVENTIL) (2.5 MG/3ML) 0.083% nebulizer solution   Nebulization   Take 2.5 mg by nebulization every 6 (six) hours as needed. For shortness of breath         . aspirin-acetaminophen-caffeine (BAYER MIGRAINE) 250-250-65 MG per tablet   Oral   Take 1-2 tablets by mouth every 6 (six) hours as needed. For pain         . azelastine (OPTIVAR) 0.05 % ophthalmic solution   Both Eyes   Place 1 drop into both eyes 2 (two) times daily as needed. For dry/irritated eyes          . beclomethasone (QVAR) 80 MCG/ACT inhaler   Inhalation   Inhale 2 puffs into the lungs daily.          . budesonide-formoterol (SYMBICORT) 160-4.5 MCG/ACT inhaler   Inhalation   Inhale 2 puffs into the lungs 2 (two) times daily.           . cetirizine (ZYRTEC) 10 MG tablet   Oral   Take 10 mg by mouth every morning.          . diclofenac sodium (VOLTAREN) 1 % GEL   Topical   Apply 1 application topically 2 (two) times daily as needed. On knees for pain/inflamation          . DULoxetine (  CYMBALTA) 60 MG capsule   Oral   Take 60 mg by mouth at bedtime.          Marland Kitchen estradiol (CLIMARA - DOSED IN MG/24 HR) 0.025 mg/24hr patch   Transdermal   Place 1 patch onto the skin once a week. On thursday         . estradiol (CLIMARA - DOSED IN MG/24 HR) 0.025 mg/24hr patch      apply 1 patch every week   4 patch   11     Please have patient make an appt for check up   . hydrOXYzine (ATARAX/VISTARIL) 50 MG tablet   Oral   Take 50 mg by mouth daily.          . hydrOXYzine (VISTARIL) 50 MG capsule   Oral   Take 50 mg by mouth daily.         . metoprolol-hydrochlorothiazide (LOPRESSOR HCT) 50-25 MG per tablet   Oral   Take 1 tablet by mouth at bedtime.          . Olopatadine HCl 0.6 % SOLN   Nasal   Place 1 puff into the nose 2 (two) times daily.           . predniSONE (DELTASONE) 20 MG tablet   Oral   Take 2 tablets (40 mg total) by mouth daily.   10 tablet   0   . ranitidine  (ZANTAC) 300 MG tablet   Oral   Take 300 mg by mouth 2 (two) times daily.          . traZODone (DESYREL) 50 MG tablet   Oral   Take 25 mg by mouth 3 times/day as needed-between meals & bedtime for sleep.          . verapamil (CALAN) 80 MG tablet   Oral   Take 1 tablet (80 mg total) by mouth 3 (three) times daily.   90 tablet   0   . vitamin C (ASCORBIC ACID) 500 MG tablet   Oral   Take 500 mg by mouth every morning.          BP 117/88  Pulse 139  Resp 15  SpO2 100% Physical Exam  Nursing note and vitals reviewed. Constitutional: She is oriented to person, place, and time.  Tachypneic, talking in 1-2 word sentences. Audible wheezing  HENT:  Head: Normocephalic.  Mouth/Throat: Oropharynx is clear and moist.  Eyes: Conjunctivae are normal. Pupils are equal, round, and reactive to light.  Neck: Normal range of motion. Neck supple.  Cardiovascular: Regular rhythm and normal heart sounds.   Tachycardic   Pulmonary/Chest:  Tachypneic, + wheezing throughout. Diminished breath sounds. No crackles.   Abdominal: Soft. Bowel sounds are normal. She exhibits no distension. There is no tenderness. There is no rebound and no guarding.  Obese   Musculoskeletal: Normal range of motion. She exhibits no edema and no tenderness.  Neurological: She is alert and oriented to person, place, and time.  Skin: Skin is warm and dry.  Psychiatric: She has a normal mood and affect. Her behavior is normal. Judgment and thought content normal.    ED Course  Procedures (including critical care time)  CRITICAL CARE Performed by: Silverio Lay, DAVID   Total critical care time: 30 min   Critical care time was exclusive of separately billable procedures and treating other patients.  Critical care was necessary to treat or prevent imminent or life-threatening deterioration.  Critical care was time spent personally by  me on the following activities: development of treatment plan with patient and/or  surrogate as well as nursing, discussions with consultants, evaluation of patient's response to treatment, examination of patient, obtaining history from patient or surrogate, ordering and performing treatments and interventions, ordering and review of laboratory studies, ordering and review of radiographic studies, pulse oximetry and re-evaluation of patient's condition.  Labs Review Labs Reviewed  CBC WITH DIFFERENTIAL - Abnormal; Notable for the following:    RDW 15.9 (*)    Platelets 409 (*)    All other components within normal limits  COMPREHENSIVE METABOLIC PANEL - Abnormal; Notable for the following:    Potassium 3.3 (*)    Glucose, Bld 117 (*)    Total Protein 8.4 (*)    All other components within normal limits  BLOOD GAS, ARTERIAL - Abnormal; Notable for the following:    pH, Arterial 7.503 (*)    pCO2 arterial 32.5 (*)    pO2, Arterial 268.0 (*)    Bicarbonate 25.3 (*)    Acid-Base Excess 3.0 (*)    All other components within normal limits   Imaging Review Dg Chest Portable 1 View  04/06/2013   CLINICAL DATA:  Shortness of breath. History of asthma. Hypertension.  EXAM: PORTABLE CHEST - 1 VIEW  COMPARISON:  CHEST x-ray 04/01/2012.  FINDINGS: Lung volumes are low. No consolidative airspace disease. No pleural effusions. Prominent soft tissue in the right hilar region may be projectional as the patient is rotated to the left which causes distortion of cardiomediastinal structures. No evidence of pulmonary edema. Heart size is within normal limits. Atherosclerosis in the thoracic aorta.  IMPRESSION: 1. No radiographic evidence of acute cardiopulmonary disease. 2. Fullness of soft tissues in the right hilar region. This may simply be projectional (as the patient is rotated to the left). However, if there is any clinical concern for malignancy, repeat evaluation with standing PA and lateral chest radiograph is highly recommended. Regardless, a followup PA and lateral chest radiograph  within the next 2-3 weeks is recommended to ensure stability or resolution of this finding.   Electronically Signed   By: Trudie Reed M.D.   On: 04/06/2013 18:00    EKG Interpretation     Ventricular Rate:  133 PR Interval:  205 QRS Duration: 88 QT Interval:  301 QTC Calculation: 446 R Axis:   84 Text Interpretation:  poor baseline. Sinus tachycardia.             Date: 04/06/2013  Rate: 100  Rhythm: sinus tachycardia  QRS Axis: normal  Intervals: normal  ST/T Wave abnormalities: nonspecific ST changes  Conduction Disutrbances:none  Narrative Interpretation:   Old EKG Reviewed: unchanged    MDM   1. Asthma exacerbation   2. Respiratory distress    [name] White is a 57 y.o. female here with SOB. Likely asthma exacerbation. Will place on bipap. Will give albuterol, steroids, magnesium. Will monitor closely.   7:58 PM On bipap for an hour. ABG reassuring. I took her off bipap and placed her on nonrebreather. I consulted family medicine and will transfer to [facility] for stepdown bed.    Richardean Canal, MD 04/06/13 1910  Richardean Canal, MD 04/06/13 [phone]

## 2013-04-07 DIAGNOSIS — E039 Hypothyroidism, unspecified: Secondary | ICD-10-CM

## 2013-04-07 DIAGNOSIS — I10 Essential (primary) hypertension: Secondary | ICD-10-CM

## 2013-04-07 DIAGNOSIS — J45901 Unspecified asthma with (acute) exacerbation: Secondary | ICD-10-CM

## 2013-04-07 DIAGNOSIS — R0609 Other forms of dyspnea: Secondary | ICD-10-CM

## 2013-04-07 LAB — MRSA PCR SCREENING: MRSA by PCR: POSITIVE — AB

## 2013-04-07 LAB — BASIC METABOLIC PANEL
BUN: 8 mg/dL (ref 6–23)
CO2: 21 mEq/L (ref 19–32)
Calcium: 8.2 mg/dL — ABNORMAL LOW (ref 8.4–10.5)
Chloride: 99 mEq/L (ref 96–112)
GFR calc non Af Amer: >90 mL/min (ref >90)
Glucose, Bld: 209 mg/dL — ABNORMAL HIGH (ref 70–99)
Sodium: 135 mEq/L (ref 135–145)

## 2013-04-07 LAB — CBC
MCH: 30.9 pg (ref 26.0–34.0)
MCHC: 34.2 g/dL (ref 30.0–36.0)
MCV: 90.6 fL (ref 78.0–100.0)
Platelets: 358 10*3/uL (ref 150–400)
RBC: 4.04 MIL/uL (ref 3.87–5.11)
RDW: 16 % — ABNORMAL HIGH (ref 11.5–15.5)

## 2013-04-07 MED ORDER — DOXYCYCLINE HYCLATE 100 MG PO TABS
100.0000 mg | ORAL_TABLET | Freq: Two times a day (BID) | ORAL | Status: DC
  Administered 2013-04-07: 100 mg via ORAL
  Filled 2013-04-07 (×2): qty 1

## 2013-04-07 MED ORDER — MUPIROCIN 2 % EX OINT
1.0000 "application " | TOPICAL_OINTMENT | Freq: Two times a day (BID) | CUTANEOUS | Status: DC
  Administered 2013-04-07: 1 via NASAL
  Filled 2013-04-07: qty 22

## 2013-04-07 MED ORDER — TIOTROPIUM BROMIDE MONOHYDRATE 18 MCG IN CAPS: 18.0000 ug | ORAL_CAPSULE | Freq: Every day | RESPIRATORY_TRACT | Status: DC

## 2013-04-07 MED ORDER — INFLUENZA VAC SPLIT QUAD 0.5 ML IM SUSP
0.5000 mL | Freq: Once | INTRAMUSCULAR | Status: AC
  Administered 2013-04-07: 0.5 mL via INTRAMUSCULAR
  Filled 2013-04-07: qty 0.5

## 2013-04-07 MED ORDER — ALBUTEROL SULFATE (2.5 MG/3ML) 0.083% IN NEBU: 2.5000 mg | INHALATION_SOLUTION | Freq: Four times a day (QID) | RESPIRATORY_TRACT | Status: DC | PRN

## 2013-04-07 MED ORDER — ALBUTEROL SULFATE (5 MG/ML) 0.5% IN NEBU
2.5000 mg | INHALATION_SOLUTION | RESPIRATORY_TRACT | Status: DC
  Administered 2013-04-07 (×4): 2.5 mg via RESPIRATORY_TRACT
  Filled 2013-04-07 (×4): qty 0.5

## 2013-04-07 MED ORDER — DOXYCYCLINE HYCLATE 100 MG PO TABS: 100.0000 mg | ORAL_TABLET | Freq: Two times a day (BID) | ORAL | Status: DC

## 2013-04-07 MED ORDER — CETIRIZINE HCL 10 MG PO TABS: 10.0000 mg | ORAL_TABLET | Freq: Every morning | ORAL | Status: DC

## 2013-04-07 MED ORDER — TIOTROPIUM BROMIDE MONOHYDRATE 18 MCG IN CAPS
18.0000 ug | ORAL_CAPSULE | Freq: Every day | RESPIRATORY_TRACT | Status: DC
  Filled 2013-04-07: qty 5

## 2013-04-07 MED ORDER — CHLORHEXIDINE GLUCONATE CLOTH 2 % EX PADS
6.0000 | MEDICATED_PAD | Freq: Every day | CUTANEOUS | Status: DC
  Administered 2013-04-07: 6 via TOPICAL

## 2013-04-07 MED ORDER — PREDNISONE 50 MG PO TABS: ORAL_TABLET | ORAL | Status: DC

## 2013-04-07 NOTE — H&P (Addendum)
FMTS Attending Note  I personally saw and evaluated the patient. The plan of care was discussed with the resident team. I agree with the assessment and plan as documented by the resident.   57 y/o female with PMH asthma presents with increasing sob. Required BiPAP in ER, now currently on room air, no acute respiratory distress. Patient follows with allergist/lung specialist as an outpatient. Has never been diagnosed with COPD however has history of intermittent smoking for 20 years as well as multiple second hand smoke exposures.  Vitals: reviewed Gen: pleasant AAF, NAD, on room air Cardiac: RRR, S1 and S2 present, no murmurs Resp: good air entry bilaterally, scattered wheezed, normal effort Abd: soft, nontender, bowel sounds present Ext: no edema  1. Asthma Exacerbation/?COPD given extensive exposure to smoke in the past - improved on steroids, wean breathing treatments, continue home Symbicort/Qvar. Previously on Singulair however provided minimal benefit. Would consider addition of Spiriva as clinical picture is consistent with COPD, will also initiate Doxycycline for 10 days. 2. Other medical conditions stable. Agree with documented plan of the resident.   Donnella Sham MD

## 2013-04-07 NOTE — Progress Notes (Signed)
Patient transferred to 6N26 at 1450 via wheelchair with NCA.  Patient alert and oriented x4 denies SOB or chest pain

## 2013-04-07 NOTE — Progress Notes (Signed)
Progress Note  Pt seen and examined.  Desiring to go home Breqathing comfortably on RA Res: mild end expiratory wheezes in RUL w/ excellent air movement. nml WOB Pt discharged  Shelly Flatten, MD Family Medicine PGY-3 04/07/2013, 8:05 PM

## 2013-04-07 NOTE — Progress Notes (Signed)
Family Medicine Teaching Service Daily Progress Note Intern Pager: [phone]  Patient name: [name] White Medical record number: [id number] Date of birth: [birth year] Age: 57 y.o. Gender: female  Primary Care Provider: Felix Pacini, DO Consultants: None Code Status: FULL  Pt Overview and Major Events to Date:  10/11 - Admitted in asthma exacerbation 10/12 - Adding doxy to treat as COPD exacerbation given pt's age on diagnosis and smoking hx  Assessment and Plan: [name] White is a 57 y.o. female presenting with acute shortness of breath likely in an asthma exacerbation given previous diagnosis. PMH is significant for persistent asthma, depression, obesity, hypothyroidism, HTN, and GERD.   # Respiratory distress likely from exacerbation of obstructive lung disease - Consider misdiagnosis in patient diagnosed with asthma with obstructive lungs in her 38s with 20 year smoking hx - possible COPD dx. Admission ABG with pH 7.5, no leukocytosis, no documented fever, CXR non-acute. Pt received CAT x 1 hr, BiPAP x 1 hr, 125mg  IV Solu-medrol, and Mg in [facility] and symptoms resolved.  - Did well overnight, transitioned off O2 and stable on room air, transitioned off of Q2 hour albuterol almost immediately upon admission by RT - Transfer out of stepdown unit - Continue q4 hour scheduled albuterol through end of day today and d/c if does well - Prednisone 50mg  PO x 4 more days starting today - Home regimen with QVAR and high dose symbicort - question this combo of symbicort and QVAR (double ICS cvg). Continue PO antihistamine. Has tried montelukast in past with no benefit - CBC and BMET stable today - Flu shot given - Pt sees [place] pulmonology - recommend follow-up for re-eval - Could consider PFTs with [facility] Raymondo [facility] at [facility], though pt has already had PFTs indeterminate time in past so will defer to PCP (ie if diffiuclt time getting in to see pulm again) - Has had allergy testing: allergic to dust [ ]   Asthma action plan prior to d/c [ ]  Adding doxycycline 100mg  BID and spiriva for COPD exacerbation coverage [ ]  On d/c, continue scheduled alb x 24 hours, spiriva, 3 more days prednisone, 9 more days doxy  # Tachycardia - Likely due to CAT, resolved. EKG at [facility] with sinus tach -->NSR  # Hypokalemia with K 3.3 yesterday and today; got Kdur just prior to today's lab draw  - No need to recheck at this time  # HTN - Stable - Cotninue home verapamil, lopressor-HCT, and monitor  # Incidentally found fullness in right hilar region on CXR [ ]  Repeat CXR in 2-3 weeks as outpatient  # GERD - Continue formulary alternative of home PPI and home H2 blocker   # Anxiety/depression - Continue home cymbalta DR 60mg  QHS, hydroxyzine 50mg  QHS and trazodone 25mg  BID and QHS PRN for sleep   FEN/GI: D/c'ed IV fluids, regular diet Prophylaxis: Lovenox   Disposition: Transfer out pf SDU [ ]  D/c pending stability today, likely this evening  Subjective: Patient feels very well today, states she is "at 99%" of her normal breathing. Wants to go home. No O2, breathing easily, no chest pain.  Objective: Temp:  [97.7 F (36.5 C)-98.2 F (36.8 C)] 98.2 F (36.8 C) (10/12 0700) Pulse Rate:  [71-139] 71 (10/12 0353) Resp:  [12-22] 17 (10/12 0353) BP: (100-143)/(52-90) 108/82 mmHg (10/12 0353) SpO2:  [98 %-100 %] 99 % (10/12 0922) FiO2 (%):  [35 %-50 %] 35 % (10/11 2200) Weight:  [289 lb 7.4 oz (131.3 kg)] 289 lb 7.4 oz (131.3  kg) (10/12 0353) Physical Exam: General: NAD, pleasant Cardiovascular: RRR, no m/r/g Respiratory: CTAB, no wheezes or crackles Abdomen: Soft, nontender, nondistended Neuro: Awake, alert, no focal deficits  Laboratory:  Recent Labs Lab 04/06/13 1740 04/07/13 0405  WBC 9.2 10.2  HGB 14.3 12.5  HCT 42.7 36.6  PLT 409* 358    Recent Labs Lab 04/06/13 1740 04/07/13 0405  NA 139 135  K 3.3* 3.3*  CL 100 99  CO2 28 21  BUN 10 8  CREATININE 0.63 0.49*  CALCIUM 9.2  8.2*  PROT 8.4*  --   BILITOT 0.3  --   ALKPHOS 90  --   ALT 16  --   AST 18  --   GLUCOSE 117* 209*    Imaging/Diagnostic Tests: None new  Leona Singleton, MD 04/07/2013, 9:37 AM PGY-2, [place] [facility] FPTS Intern pager: [phone], text pages welcome

## 2013-04-07 NOTE — Progress Notes (Signed)
04/07/2013- Respiratory care note- Pt instructed on use of Spiriva MDI.  Pt has used Spiriva in the past and was able to demonstrate proper use of MDI and verbalize understanding of medication and delivery. MDI to start in AM for regular schedule of med.  S Damary Doland rrt, rcp

## 2013-04-07 NOTE — Progress Notes (Signed)
Interim progress note:  Received call from RT stating patient weaned to room air, is talking, eating graham crackers and peanut butter, and satting 98%.  BP 116/59  Pulse 100  Temp(Src) 97.8 F (36.6 C) (Oral)  Resp 30  SpO2 100%  - Cancel AM ABG - Placing regular diet order  Leona Singleton, MD 04/07/2013 12:28 AM

## 2013-04-07 NOTE — Discharge Summary (Signed)
Family Medicine Teaching [facility] Discharge Summary  Patient name: [name] White Medical record number: [id number] Date of birth: 09/28/1955 Age: 57 y.o. Gender: female Date of Admission: 04/06/2013  Date of Discharge: 04/07/13 Admitting Physician: Uvaldo Rising, MD  Primary Care Provider: Felix Pacini, DO Consultants: None  Indication for Hospitalization: Respiratory distress  Discharge Diagnoses/Problem List:  Respiratory distress likely COPD given smoking hx and age  Disposition: Home  Discharge Condition: Stable  Discharge Exam: Please see progress note by me dated 04/07/13  Brief Hospital Course: [name] White is a 57 y.o. female presenting with acute shortness of breath likely in an asthma exacerbation given previous diagnosis. PMH is significant for persistent asthma, depression, obesity, hypothyroidism, HTN, and GERD.   Patient was found to be in respiratory distress from exacerbation of supposed asthma. Admission ABG had pH 7.5 and pt had no leukocytosis, documented fever. CXR was non-acute. Pt received 1 hour CAT and 1 hour BiPAP in the ED, 125mg  IV solu-medrol, and MG and symptoms resolved. She did well overnight and was transitioned off O2 and stable on room air. She was transitioned off Q2 albuterol almost immediately upon admission by RT. She was transferred out of stepdown unit and albuterol scheduled Q4 hours. She did well on this and by the evening was breathing comfortably on room air. She was continued on 4 more days of prednisone 50mg  PO, home regimen QVAR and high dose symbicort continued though question this combination (double ICS coverage), and continued PO antihistamine. She had tried montelukast in the past with no benefit. Asthma action plan and flu shot were given prior to discharge. She was recommended to f/u with [facility] pulmonology who she has seen in the past. Questioned dx of asthma vs more probable COPD given smoking hx and age at dx (78). Added  doxycycline 100mg  BID x 10 days and spiriva for COPD exacerbation coverage.    Issues for Follow Up:  - Follow up with pulmonologist for eval for possible COPD in stead of asthma - Continue doxycycline total 10 days (until 10/21), prednisone until 10/15, and started spiriva in hospital - Repeat CXR in 2-3 weeks for incidental hilar fullness findings  Significant Procedures: CAT, Bipap in ED  Significant Labs and Imaging:   Recent Labs Lab 04/06/13 1740 04/07/13 0405  WBC 9.2 10.2  HGB 14.3 12.5  HCT 42.7 36.6  PLT 409* 358    Recent Labs Lab 04/06/13 1740 04/07/13 0405  NA 139 135  K 3.3* 3.3*  CL 100 99  CO2 28 21  GLUCOSE 117* 209*  BUN 10 8  CREATININE 0.63 0.49*  CALCIUM 9.2 8.2*  ALKPHOS 90  --   AST 18  --   ALT 16  --   ALBUMIN 3.5  --      Results/Tests Pending at Time of Discharge: None  Discharge Medications:    Medication List         albuterol 108 (90 BASE) MCG/ACT inhaler  Commonly known as:  PROVENTIL HFA;VENTOLIN HFA  Inhale 2 puffs into the lungs every 6 (six) hours as needed. For shortness of breath     albuterol (2.5 MG/3ML) 0.083% nebulizer solution  Commonly known as:  PROVENTIL  Take 3 mLs (2.5 mg total) by nebulization every 6 (six) hours as needed. For shortness of breath     azelastine 0.05 % ophthalmic solution  Commonly known as:  OPTIVAR  Place 1 drop into both eyes 2 (two) times daily as needed. For dry/irritated  eyes     BAYER MIGRAINE 250-250-65 MG per tablet  Generic drug:  aspirin-acetaminophen-caffeine  Take 1-2 tablets by mouth every 6 (six) hours as needed. For pain     beclomethasone 80 MCG/ACT inhaler  Commonly known as:  QVAR  Inhale 2 puffs into the lungs daily.     budesonide-formoterol 160-4.5 MCG/ACT inhaler  Commonly known as:  SYMBICORT  Inhale 2 puffs into the lungs 2 (two) times daily.     cetirizine 10 MG tablet  Commonly known as:  ZYRTEC  Take 1 tablet (10 mg total) by mouth every morning.      DEXILANT 60 MG capsule  Generic drug:  dexlansoprazole  Take 60 mg by mouth daily.     diclofenac sodium 1 % Gel  Commonly known as:  VOLTAREN  Apply 1 application topically 2 (two) times daily as needed. On knees for pain/inflamation     doxycycline 100 MG tablet  Commonly known as:  VIBRA-TABS  Take 1 tablet (100 mg total) by mouth every 12 (twelve) hours.     DULoxetine 60 MG capsule  Commonly known as:  CYMBALTA  Take 60 mg by mouth at bedtime.     estradiol 0.025 mg/24hr patch  Commonly known as:  CLIMARA - Dosed in mg/24 hr  Place 1 patch onto the skin once a week. On Friday     hydrOXYzine 50 MG tablet  Commonly known as:  ATARAX/VISTARIL  Take 50 mg by mouth at bedtime.     metoprolol-hydrochlorothiazide 50-25 MG per tablet  Commonly known as:  LOPRESSOR HCT  Take 1 tablet by mouth at bedtime.     Olopatadine HCl 0.6 % Soln  Place 1 puff into the nose at bedtime.     predniSONE 50 MG tablet  Commonly known as:  DELTASONE  Take 1 tablet daily starting 10/13     ranitidine 300 MG tablet  Commonly known as:  ZANTAC  Take 300 mg by mouth at bedtime.     tiotropium 18 MCG inhalation capsule  Commonly known as:  SPIRIVA  Place 1 capsule (18 mcg total) into inhaler and inhale daily.     traZODone 50 MG tablet  Commonly known as:  DESYREL  Take 25 mg by mouth 3 times/day as needed-between meals & bedtime for sleep.     verapamil 80 MG tablet  Commonly known as:  CALAN  Take 1 tablet (80 mg total) by mouth 3 (three) times daily.     vitamin C 500 MG tablet  Commonly known as:  ASCORBIC ACID  Take 500 mg by mouth every morning.     vitamin E 100 UNIT capsule  Take 100 Units by mouth daily.        Discharge Instructions: Please refer to Patient Instructions section of EMR for full details.  Patient was counseled important signs and symptoms that should prompt return to medical care, changes in medications, dietary instructions, activity restrictions, and  follow up appointments.   Follow-Up Appointments: Follow-up Information   Follow up with Felix Pacini, DO On 04/15/2013. (As scheduled -  call and ask them to change this to hospital follow up)    Specialty:  Family Medicine   Contact information:   [address] [place] [address] [phone]       Schedule an appointment as soon as possible for a visit with [facility]. (to follow up with your pulmonologist)    Specialty:  Pulmonology   Contact information:  [address] [place] [address] [phone]      Leona Singleton, MD 04/09/2013, 7:00 PM PGY-2, [facility]

## 2013-04-07 NOTE — Progress Notes (Signed)
FMTS Attending Note  I personally saw and evaluated the patient. The plan of care was discussed with the resident team. I agree with the assessment and plan as documented by the resident.   Mikaia Janvier MD 

## 2013-04-07 NOTE — Progress Notes (Signed)
Pt was discharged from hospital at 2103. Discharge instructions provided with voiced understanding.

## 2013-04-10 NOTE — Discharge Summary (Signed)
I agree with the discharge summary as documented.   Trinita Devlin MD  

## 2013-04-16 ENCOUNTER — Ambulatory Visit: Payer: Medicare Other | Admitting: Family Medicine

## 2013-04-24 ENCOUNTER — Telehealth: Payer: Self-pay | Admitting: *Deleted

## 2013-04-24 ENCOUNTER — Encounter: Payer: Self-pay | Admitting: Family Medicine

## 2013-04-24 NOTE — Telephone Encounter (Signed)
Nurse calling stating Dr. Arlyss Gandy would like to speak with Dr. Claiborne Billings regarding patient at number provided. Will forward to MD.

## 2013-04-25 ENCOUNTER — Encounter: Payer: Self-pay | Admitting: Family Medicine

## 2013-05-08 ENCOUNTER — Ambulatory Visit (INDEPENDENT_AMBULATORY_CARE_PROVIDER_SITE_OTHER): Payer: Medicare Other | Admitting: Family Medicine

## 2013-05-08 ENCOUNTER — Encounter: Payer: Self-pay | Admitting: Family Medicine

## 2013-05-08 VITALS — BP 142/89 | HR 114 | Temp 98.2°F | Ht 67.5 in | Wt 289.0 lb

## 2013-05-08 DIAGNOSIS — J45901 Unspecified asthma with (acute) exacerbation: Secondary | ICD-10-CM

## 2013-05-08 DIAGNOSIS — E039 Hypothyroidism, unspecified: Secondary | ICD-10-CM

## 2013-05-08 DIAGNOSIS — E669 Obesity, unspecified: Secondary | ICD-10-CM

## 2013-05-08 DIAGNOSIS — J45909 Unspecified asthma, uncomplicated: Secondary | ICD-10-CM

## 2013-05-08 DIAGNOSIS — I10 Essential (primary) hypertension: Secondary | ICD-10-CM

## 2013-05-08 LAB — CBC WITH DIFFERENTIAL/PLATELET
Basophils Absolute: 0 10*3/uL (ref 0.0–0.1)
Basophils Relative: 0 % (ref 0–1)
Eosinophils Absolute: 0.4 10*3/uL (ref 0.0–0.7)
HCT: 38.8 % (ref 36.0–46.0)
Hemoglobin: 13.3 g/dL (ref 12.0–15.0)
MCH: 30 pg (ref 26.0–34.0)
MCHC: 34.3 g/dL (ref 30.0–36.0)
Monocytes Absolute: 0.7 10*3/uL (ref 0.1–1.0)
Monocytes Relative: 7 % (ref 3–12)
Neutrophils Relative %: 62 % (ref 43–77)
RDW: 16 % — ABNORMAL HIGH (ref 11.5–15.5)
WBC: 9.2 10*3/uL (ref 4.0–10.5)

## 2013-05-08 LAB — LIPID PANEL
Cholesterol: 215 mg/dL — ABNORMAL HIGH (ref 0–200)
VLDL: 19 mg/dL (ref 0–40)

## 2013-05-08 LAB — BASIC METABOLIC PANEL
BUN: 10 mg/dL (ref 6–23)
CO2: 29 mEq/L (ref 19–32)
Calcium: 8.9 mg/dL (ref 8.4–10.5)
Glucose, Bld: 87 mg/dL (ref 70–99)

## 2013-05-08 MED ORDER — METOPROLOL-HYDROCHLOROTHIAZIDE 50-25 MG PO TABS: 1.0000 | ORAL_TABLET | Freq: Every day | ORAL | Status: DC

## 2013-05-08 NOTE — Assessment & Plan Note (Addendum)
Patient's lung exam today is clear, although she does have a raspy voice. Along with the allergist I have concerns of her diagnosis of asthma as opposed to COPD. Like to see formal pulmonary function tests that are current to better manage her medications.  Have also a referral to have a sleep study completed for patient at [facility] long sleep disorder clinic.  I have also ordered a repeat chest x-ray as recommended in her discharge summary to follow her up on hilar abnormalities.  Followup after studies have been completed.

## 2013-05-08 NOTE — Assessment & Plan Note (Signed)
Patient not at goal today. First BP elevated at 139/102 and repeat at 142/89. Instructed patient to take her blood pressure at [facility] 2 times a week and to let us know what her blood pressures result. No more parameters covered patient is to contact if elevated. May need to either increase dose or start any med. Sleep study will also be helpful with this evaluation.

## 2013-05-08 NOTE — Patient Instructions (Signed)
Sleep Apnea  Sleep apnea is a sleep disorder characterized by abnormal pauses in breathing while you sleep. When your breathing pauses, the level of oxygen in your blood decreases. This causes you to move out of deep sleep and into light sleep. As a result, your quality of sleep is poor, and the system that carries your blood throughout your body (cardiovascular system) experiences stress. If sleep apnea remains untreated, the following conditions can develop:  High blood pressure (hypertension).  Coronary artery disease.  Inability to achieve or maintain an erection (impotence).  Impairment of your thought process (cognitive dysfunction). There are three types of sleep apnea: 1. Obstructive sleep apnea Pauses in breathing during sleep because of a blocked airway. 2. Central sleep apnea Pauses in breathing during sleep because the area of the brain that controls your breathing does not send the correct signals to the muscles that control breathing. 3. Mixed sleep apnea A combination of both obstructive and central sleep apnea. RISK FACTORS The following risk factors can increase your risk of developing sleep apnea:  Being overweight.  Smoking.  Having narrow passages in your nose and throat.  Being of older age.  Being female.  Alcohol use.  Sedative and tranquilizer use.  Ethnicity. Among individuals younger than 35 years, African Americans are at increased risk of sleep apnea. SYMPTOMS   Difficulty staying asleep.  Daytime sleepiness and fatigue.  Loss of energy.  Irritability.  Loud, heavy snoring.  Morning headaches.  Trouble concentrating.  Forgetfulness.  Decreased interest in sex. DIAGNOSIS  In order to diagnose sleep apnea, your caregiver will perform a physical examination. Your caregiver may suggest that you take a home sleep test. Your caregiver may also recommend that you spend the night in a sleep lab. In the sleep lab, several monitors record  information about your heart, lungs, and brain while you sleep. Your leg and arm movements and blood oxygen level are also recorded. TREATMENT The following actions may help to resolve mild sleep apnea:  Sleeping on your side.   Using a decongestant if you have nasal congestion.   Avoiding the use of depressants, including alcohol, sedatives, and narcotics.   Losing weight and modifying your diet if you are overweight. There also are devices and treatments to help open your airway:  Oral appliances. These are custom-made mouthpieces that shift your lower jaw forward and slightly open your bite. This opens your airway.  Devices that create positive airway pressure. This positive pressure "splints" your airway open to help you breathe better during sleep. The following devices create positive airway pressure:  Continuous positive airway pressure (CPAP) device. The CPAP device creates a continuous level of air pressure with an air pump. The air is delivered to your airway through a mask while you sleep. This continuous pressure keeps your airway open.  Nasal expiratory positive airway pressure (EPAP) device. The EPAP device creates positive air pressure as you exhale. The device consists of single-use valves, which are inserted into each nostril and held in place by adhesive. The valves create very little resistance when you inhale but create much more resistance when you exhale. That increased resistance creates the positive airway pressure. This positive pressure while you exhale keeps your airway open, making it easier to breath when you inhale again.  Bilevel positive airway pressure (BPAP) device. The BPAP device is used mainly in patients with central sleep apnea. This device is similar to the CPAP device because it also uses an air pump to deliver  continuous air pressure through a mask. However, with the BPAP machine, the pressure is set at two different levels. The pressure when you  exhale is lower than the pressure when you inhale.  Surgery. Typically, surgery is only done if you cannot comply with less invasive treatments or if the less invasive treatments do not improve your condition. Surgery involves removing excess tissue in your airway to create a wider passage way. Document Released: 06/03/2002 Document Revised: 10/08/2012 Document Reviewed: 10/20/2011 [facility] Patient Information 2014 [place], [place].  It was a pleasure meeting you today. I had placed referral to pulmonology to have PFTs. I have also placed in order to have a sleep study completed. And when to take your blood pressure a few times a week for the next 2 weeks. If it is consistently over 140/80 I want to call and make an appointment to be seen. If you're swelling in your fingers do not go down over the next couple weeks after being off of prednisone and I want you to also make appointment to be seen.

## 2013-05-08 NOTE — Assessment & Plan Note (Signed)
Patient has history of hypothyroidism however she was told she does not need medications. Will repeat TSH today

## 2013-05-08 NOTE — Progress Notes (Signed)
Subjective:     Patient ID: [name] White, female   DOB: Nov 05, 1955, 57 y.o.   MRN: [id number]  HPI Asthma/COPD: Patient is here to follow-up. She states she has a mild sore throat and a cough today. She works for [employer] as a Lawyer. She currently to albuterol by inhaler and nebulizer. She states the nebulizer seems to work better for her but she can't take it everywhere. She is also on Qvar, Symbicort and Spiriva. She sees an allergist/asthma Dr. in the [place] area, who had contacted me regarding her care. Her allergist believes that she possibly has some vocal cord paralysis and is wondering if her diagnosis of asthma/COPD is accurate or in need of additional medication.   Tachycardia with elevated BP: Patient reports using her inhaler recently. She also reports occasional palpitations in the middle of the night. She denies chest pains or shortness of breath. She does not have much energy throughout the day. She states her blood pressures have been under control there just elevated today. She is currently on Lopressor/HCTZ and verapamil and states she takes them as prescribed.   Review of Systems Negative, with the exception of above mentioned in HPI  Objective:   Physical Exam BP 142/89  Pulse 114  Temp(Src) 98.2 F (36.8 C) (Oral)  Ht 5' 7.5" (1.715 m)  Wt 289 lb (131.09 kg)  BMI 44.57 kg/m2  SpO2 99% Gen: NAD. Pleasant and talkative HEENT: AT. [place].  CV: Tachycardic. Regular rhythm. Chest: CTAB, no wheeze or crackles Abd: Soft. Obese. NTND. BS present. Unable to appreciate Masses, although body habitus may be an issue Ext: No erythema. No edema.  Skin: No rashes, purpura or petechiae.  Neuro: Normal gait. PERLA. EOMi. Alert. Grossly intact.

## 2013-05-09 ENCOUNTER — Encounter: Payer: Self-pay | Admitting: Family Medicine

## 2013-05-09 ENCOUNTER — Telehealth: Payer: Self-pay | Admitting: Family Medicine

## 2013-05-09 MED ORDER — ATORVASTATIN CALCIUM 20 MG PO TABS: 20.0000 mg | ORAL_TABLET | Freq: Every day | ORAL | Status: DC

## 2013-05-09 NOTE — Telephone Encounter (Signed)
Please call [name] White and inform her cholesterol is mildly high and I would like to start a cholesterol lowering medication on her. The medication is called Lipitor and will protect her cardiovascular system. She should also attempt to make dietary changes. She is also low in potassium. She should take a supplement daily or eat foods high in potassium such as beans, dark leafy veggies, squash, yogurt, avocados or bananas.  Thanks.

## 2013-05-09 NOTE — Telephone Encounter (Signed)
Related message,patient voiced understanding. Marion Seese S  

## 2013-05-16 ENCOUNTER — Institutional Professional Consult (permissible substitution): Payer: Medicare Other | Admitting: Critical Care Medicine

## 2013-05-21 ENCOUNTER — Encounter: Payer: Self-pay | Admitting: Critical Care Medicine

## 2013-05-29 ENCOUNTER — Institutional Professional Consult (permissible substitution): Payer: Medicare Other | Admitting: Critical Care Medicine

## 2013-05-30 ENCOUNTER — Institutional Professional Consult (permissible substitution): Payer: Medicare Other | Admitting: Critical Care Medicine

## 2013-06-11 ENCOUNTER — Encounter (HOSPITAL_BASED_OUTPATIENT_CLINIC_OR_DEPARTMENT_OTHER): Payer: Medicare HMO

## 2013-08-03 ENCOUNTER — Emergency Department (INDEPENDENT_AMBULATORY_CARE_PROVIDER_SITE_OTHER)
Admission: EM | Admit: 2013-08-03 | Discharge: 2013-08-03 | Disposition: A | Discharge status: Home / Self Care | Payer: Medicare Other | Source: Home / Self Care | Attending: Family Medicine | Admitting: Family Medicine

## 2013-08-03 ENCOUNTER — Encounter (HOSPITAL_COMMUNITY): Payer: Self-pay | Admitting: Emergency Medicine

## 2013-08-03 DIAGNOSIS — M7580 Other shoulder lesions, unspecified shoulder: Secondary | ICD-10-CM

## 2013-08-03 DIAGNOSIS — M7541 Impingement syndrome of right shoulder: Secondary | ICD-10-CM

## 2013-08-03 DIAGNOSIS — M25819 Other specified joint disorders, unspecified shoulder: Secondary | ICD-10-CM

## 2013-08-03 MED ORDER — TRAMADOL HCL 50 MG PO TABS
50.0000 mg | ORAL_TABLET | Freq: Four times a day (QID) | ORAL | Status: DC | PRN
Start: 2013-08-03 — End: 2013-09-17

## 2013-08-03 MED ORDER — METHYLPREDNISOLONE ACETATE 40 MG/ML IJ SUSP
40.0000 mg | Freq: Once | INTRAMUSCULAR | Status: AC
  Administered 2013-08-03: 40 mg via INTRA_ARTICULAR

## 2013-08-03 MED ORDER — METHYLPREDNISOLONE ACETATE 40 MG/ML IJ SUSP
INTRAMUSCULAR | Status: AC
  Filled 2013-08-03: qty 5

## 2013-08-03 NOTE — ED Provider Notes (Signed)
[name] White is a 58 y.o. female who presents to Urgent Care today for right shoulder pain. Patient is in 2 months unless worsening right shoulder pain. The pain is located in the anterior and lateral aspect of her upper arm. Pain is worse at night and with overhand motion. She denies any weakness or numbness or injury. No radiating pain. No fevers or chills. She's tried some over-the-counter medications which have not helped.   Past Medical History  Diagnosis Date  . Asthma   . Hypertension   . Thyroid disease   . Osteoarthritis   . GERD (gastroesophageal reflux disease)   . Anxiety   . Depression   . GURKYHCW(237.6)    History  Substance Use Topics  . Smoking status: Former Smoker -- 0.25 packs/day for 20 years    Types: Cigarettes    Quit date: 07/16/2012  . Smokeless tobacco: Not on file  . Alcohol Use: No   ROS as above Medications: Current Facility-Administered Medications  Medication Dose Route Frequency Provider Last Rate Last Dose  . diphenhydrAMINE (BENADRYL) capsule 50 mg  50 mg Oral Q6H PRN Minerva Ends, MD   50 mg at 01/06/12 1458   Current Outpatient Prescriptions  Medication Sig Dispense Refill  . albuterol (PROVENTIL HFA;VENTOLIN HFA) 108 (90 BASE) MCG/ACT inhaler Inhale 2 puffs into the lungs every 6 (six) hours as needed. For shortness of breath      . albuterol (PROVENTIL) (2.5 MG/3ML) 0.083% nebulizer solution Take 3 mLs (2.5 mg total) by nebulization every 6 (six) hours as needed. For shortness of breath  75 mL  12  . atorvastatin (LIPITOR) 20 MG tablet Take 1 tablet (20 mg total) by mouth daily.  90 tablet  3  . azelastine (OPTIVAR) 0.05 % ophthalmic solution Place 1 drop into both eyes 2 (two) times daily as needed. For dry/irritated eyes       . beclomethasone (QVAR) 80 MCG/ACT inhaler Inhale 2 puffs into the lungs daily.       . budesonide-formoterol (SYMBICORT) 160-4.5 MCG/ACT inhaler Inhale 2 puffs into the lungs 2 (two) times daily.        .  cetirizine (ZYRTEC) 10 MG tablet Take 1 tablet (10 mg total) by mouth every morning.  30 tablet  11  . dexlansoprazole (DEXILANT) 60 MG capsule Take 60 mg by mouth daily.      . diclofenac sodium (VOLTAREN) 1 % GEL Apply 1 application topically 2 (two) times daily as needed. On knees for pain/inflamation       . DULoxetine (CYMBALTA) 60 MG capsule Take 60 mg by mouth at bedtime.       Marland Kitchen estradiol (CLIMARA - DOSED IN MG/24 HR) 0.025 mg/24hr patch Place 1 patch onto the skin once a week. On Friday      . hydrOXYzine (ATARAX/VISTARIL) 50 MG tablet Take 50 mg by mouth at bedtime.       . metoprolol-hydrochlorothiazide (LOPRESSOR HCT) 50-25 MG per tablet Take 1 tablet by mouth at bedtime.  90 tablet  3  . Olopatadine HCl 0.6 % SOLN Place 1 puff into the nose at bedtime.       . ranitidine (ZANTAC) 300 MG tablet Take 300 mg by mouth at bedtime.       Marland Kitchen tiotropium (SPIRIVA) 18 MCG inhalation capsule Place 1 capsule (18 mcg total) into inhaler and inhale daily.  30 capsule  12  . traMADol (ULTRAM) 50 MG tablet Take 1 tablet (50 mg total) by mouth every  6 (six) hours as needed.  15 tablet  0  . traZODone (DESYREL) 50 MG tablet Take 25 mg by mouth 3 times/day as needed-between meals & bedtime for sleep.       . verapamil (CALAN) 80 MG tablet Take 1 tablet (80 mg total) by mouth 3 (three) times daily.  90 tablet  0  . vitamin C (ASCORBIC ACID) 500 MG tablet Take 500 mg by mouth every morning.      . vitamin E 100 UNIT capsule Take 100 Units by mouth daily.        Exam:  BP 183/96  Pulse 66  Temp(Src) 98.4 F (36.9 C) (Oral)  Resp 18  SpO2 95% Gen: Well NAD RIGHT SHOULDER: Normal-appearing.  Diffusely tender across the trapezius. Range of motion is intact with pain in the abduction ARC above 120. Positive impingement testing. Strength is intact however pain with empty can. Capillary refill and sensation are intact bilateral upper extremities. Contralateral left shoulder normal-appearing nontender  normal motion normal strength negative impingement testing.  Subacromial Injection: right   Consent obtained and time out performed.  Area cleaned with alcohol.  40Mg  of depomedrol and 60ml of 0.5% marcaine was injected into the subacromial bursa without complication or bleeding. Patient tolerated the procedure well.   Assessment and Plan: 58 y.o. female with right shoulder impingement. Plan to treat with corticosteroid injection and tramadol. Followup with orthopedics. Home exercise program.  Discussed warning signs or symptoms. Please see discharge instructions. Patient expresses understanding.    Gregor Hams, MD 08/03/13 [phone]

## 2013-08-03 NOTE — ED Notes (Signed)
Pt c/o right shoulder pain onset 2 months that's gradually getting worse Hx of tendonitis; sees Dr. Noemi Chapel Pain increases w/activity Alert w/no signs of acute distress.

## 2013-08-03 NOTE — Discharge Instructions (Signed)
Thank you for coming in today. Use tramadol for severe pain. Followup with Dr. Noemi Chapel. Try to stay active Call or go to the ER if you develop a large red swollen joint with extreme pain or oozing puss.   Impingement Syndrome, Rotator Cuff, Bursitis with Rehab Impingement syndrome is a condition that involves inflammation of the tendons of the rotator cuff and the subacromial bursa, that causes pain in the shoulder. The rotator cuff consists of four tendons and muscles that control much of the shoulder and upper arm function. The subacromial bursa is a fluid filled sac that helps reduce friction between the rotator cuff and one of the bones of the shoulder (acromion). Impingement syndrome is usually an overuse injury that causes swelling of the bursa (bursitis), swelling of the tendon (tendonitis), and/or a tear of the tendon (strain). Strains are classified into three categories. Grade 1 strains cause pain, but the tendon is not lengthened. Grade 2 strains include a lengthened ligament, due to the ligament being stretched or partially ruptured. With grade 2 strains there is still function, although the function may be decreased. Grade 3 strains include a complete tear of the tendon or muscle, and function is usually impaired. SYMPTOMS   Pain around the shoulder, often at the outer portion of the upper arm.  Pain that gets worse with shoulder function, especially when reaching overhead or lifting.  Sometimes, aching when not using the arm.  Pain that wakes you up at night.  Sometimes, tenderness, swelling, warmth, or redness over the affected area.  Loss of strength.  Limited motion of the shoulder, especially reaching behind the back (to the back pocket or to unhook bra) or across your body.  Crackling sound (crepitation) when moving the arm.  Biceps tendon pain and inflammation (in the front of the shoulder). Worse when bending the elbow or lifting. CAUSES  Impingement syndrome is often  an overuse injury, in which chronic (repetitive) motions cause the tendons or bursa to become inflamed. A strain occurs when a force is paced on the tendon or muscle that is greater than it can withstand. Common mechanisms of injury include: Stress from sudden increase in duration, frequency, or intensity of training.  Direct hit (trauma) to the shoulder.  Aging, erosion of the tendon with normal use.  Bony bump on shoulder (acromial spur). RISK INCREASES WITH:  Contact sports (football, wrestling, boxing).  Throwing sports (baseball, tennis, volleyball).  Weightlifting and bodybuilding.  Heavy labor.  Previous injury to the rotator cuff, including impingement.  Poor shoulder strength and flexibility.  Failure to warm up properly before activity.  Inadequate protective equipment.  Old age.  Bony bump on shoulder (acromial spur). PREVENTION   Warm up and stretch properly before activity.  Allow for adequate recovery between workouts.  Maintain physical fitness:  Strength, flexibility, and endurance.  Cardiovascular fitness.  Learn and use proper exercise technique. PROGNOSIS  If treated properly, impingement syndrome usually goes away within 6 weeks. Sometimes surgery is required.  RELATED COMPLICATIONS   Longer healing time if not properly treated, or if not given enough time to heal.  Recurring symptoms, that result in a chronic condition.  Shoulder stiffness, frozen shoulder, or loss of motion.  Rotator cuff tendon tear.  Recurring symptoms, especially if activity is resumed too soon, with overuse, with a direct blow, or when using poor technique. TREATMENT  Treatment first involves the use of ice and medicine, to reduce pain and inflammation. The use of strengthening and stretching exercises may  help reduce pain with activity. These exercises may be performed at home or with a therapist. If non-surgical treatment is unsuccessful after more than 6 months,  surgery may be advised. After surgery and rehabilitation, activity is usually possible in 3 months.  MEDICATION  If pain medicine is needed, nonsteroidal anti-inflammatory medicines (aspirin and ibuprofen), or other minor pain relievers (acetaminophen), are often advised.  Do not take pain medicine for 7 days before surgery.  Prescription pain relievers may be given, if your caregiver thinks they are needed. Use only as directed and only as much as you need.  Corticosteroid injections may be given by your caregiver. These injections should be reserved for the most serious cases, because they may only be given a certain number of times. HEAT AND COLD  Cold treatment (icing) should be applied for 10 to 15 minutes every 2 to 3 hours for inflammation and pain, and immediately after activity that aggravates your symptoms. Use ice packs or an ice massage.  Heat treatment may be used before performing stretching and strengthening activities prescribed by your caregiver, physical therapist, or athletic trainer. Use a heat pack or a warm water soak. SEEK MEDICAL CARE IF:   Symptoms get worse or do not improve in 4 to 6 weeks, despite treatment.  New, unexplained symptoms develop. (Drugs used in treatment may produce side effects.) EXERCISES  RANGE OF MOTION (ROM) AND STRETCHING EXERCISES - Impingement Syndrome (Rotator Cuff  Tendinitis, Bursitis) These exercises may help you when beginning to rehabilitate your injury. Your symptoms may go away with or without further involvement from your physician, physical therapist or athletic trainer. While completing these exercises, remember:   Restoring tissue flexibility helps normal motion to return to the joints. This allows healthier, less painful movement and activity.  An effective stretch should be held for at least 30 seconds.  A stretch should never be painful. You should only feel a gentle lengthening or release in the stretched tissue. STRETCH   Flexion, Standing  Stand with good posture. With an underhand grip on your right / left hand, and an overhand grip on the opposite hand, grasp a broomstick or cane so that your hands are a little more than shoulder width apart.  Keeping your right / left elbow straight and shoulder muscles relaxed, push the stick with your opposite hand, to raise your right / left arm in front of your body and then overhead. Raise your arm until you feel a stretch in your right / left shoulder, but before you have increased shoulder pain.  Try to avoid shrugging your right / left shoulder as your arm rises, by keeping your shoulder blade tucked down and toward your mid-back spine. Hold for __________ seconds.  Slowly return to the starting position. Repeat __________ times. Complete this exercise __________ times per day. STRETCH  Abduction, Supine  Lie on your back. With an underhand grip on your right / left hand and an overhand grip on the opposite hand, grasp a broomstick or cane so that your hands are a little more than shoulder width apart.  Keeping your right / left elbow straight and your shoulder muscles relaxed, push the stick with your opposite hand, to raise your right / left arm out to the side of your body and then overhead. Raise your arm until you feel a stretch in your right / left shoulder, but before you have increased shoulder pain.  Try to avoid shrugging your right / left shoulder as your arm rises,  by keeping your shoulder blade tucked down and toward your mid-back spine. Hold for __________ seconds.  Slowly return to the starting position. Repeat __________ times. Complete this exercise __________ times per day. ROM  Flexion, Active-Assisted  Lie on your back. You may bend your knees for comfort.  Grasp a broomstick or cane so your hands are about shoulder width apart. Your right / left hand should grip the end of the stick, so that your hand is positioned "thumbs-up," as if you were  about to shake hands.  Using your healthy arm to lead, raise your right / left arm overhead, until you feel a gentle stretch in your shoulder. Hold for __________ seconds.  Use the stick to assist in returning your right / left arm to its starting position. Repeat __________ times. Complete this exercise __________ times per day.  ROM - Internal Rotation, Supine   Lie on your back on a firm surface. Place your right / left elbow about 60 degrees away from your side. Elevate your elbow with a folded towel, so that the elbow and shoulder are the same height.  Using a broomstick or cane and your strong arm, pull your right / left hand toward your body until you feel a gentle stretch, but no increase in your shoulder pain. Keep your shoulder and elbow in place throughout the exercise.  Hold for __________ seconds. Slowly return to the starting position. Repeat __________ times. Complete this exercise __________ times per day. STRETCH - Internal Rotation  Place your right / left hand behind your back, palm up.  Throw a towel or belt over your opposite shoulder. Grasp the towel with your right / left hand.  While keeping an upright posture, gently pull up on the towel, until you feel a stretch in the front of your right / left shoulder.  Avoid shrugging your right / left shoulder as your arm rises, by keeping your shoulder blade tucked down and toward your mid-back spine.  Hold for __________ seconds. Release the stretch, by lowering your healthy hand. Repeat __________ times. Complete this exercise __________ times per day. ROM - Internal Rotation   Using an underhand grip, grasp a stick behind your back with both hands.  While standing upright with good posture, slide the stick up your back until you feel a mild stretch in the front of your shoulder.  Hold for __________ seconds. Slowly return to your starting position. Repeat __________ times. Complete this exercise __________ times per  day.  STRETCH  Posterior Shoulder Capsule   Stand or sit with good posture. Grasp your right / left elbow and draw it across your chest, keeping it at the same height as your shoulder.  Pull your elbow, so your upper arm comes in closer to your chest. Pull until you feel a gentle stretch in the back of your shoulder.  Hold for __________ seconds. Repeat __________ times. Complete this exercise __________ times per day. STRENGTHENING EXERCISES - Impingement Syndrome (Rotator Cuff Tendinitis, Bursitis) These exercises may help you when beginning to rehabilitate your injury. They may resolve your symptoms with or without further involvement from your physician, physical therapist or athletic trainer. While completing these exercises, remember:  Muscles can gain both the endurance and the strength needed for everyday activities through controlled exercises.  Complete these exercises as instructed by your physician, physical therapist or athletic trainer. Increase the resistance and repetitions only as guided.  You may experience muscle soreness or fatigue, but the pain or discomfort  you are trying to eliminate should never worsen during these exercises. If this pain does get worse, stop and make sure you are following the directions exactly. If the pain is still present after adjustments, discontinue the exercise until you can discuss the trouble with your clinician.  During your recovery, avoid activity or exercises which involve actions that place your injured hand or elbow above your head or behind your back or head. These positions stress the tissues which you are trying to heal. STRENGTH - Scapular Depression and Adduction   With good posture, sit on a firm chair. Support your arms in front of you, with pillows, arm rests, or on a table top. Have your elbows in line with the sides of your body.  Gently draw your shoulder blades down and toward your mid-back spine. Gradually increase the  tension, without tensing the muscles along the top of your shoulders and the back of your neck.  Hold for __________ seconds. Slowly release the tension and relax your muscles completely before starting the next repetition.  After you have practiced this exercise, remove the arm support and complete the exercise in standing as well as sitting position. Repeat __________ times. Complete this exercise __________ times per day.  STRENGTH - Shoulder Abductors, Isometric  With good posture, stand or sit about 4-6 inches from a wall, with your right / left side facing the wall.  Bend your right / left elbow. Gently press your right / left elbow into the wall. Increase the pressure gradually, until you are pressing as hard as you can, without shrugging your shoulder or increasing any shoulder discomfort.  Hold for __________ seconds.  Release the tension slowly. Relax your shoulder muscles completely before you begin the next repetition. Repeat __________ times. Complete this exercise __________ times per day.  STRENGTH - External Rotators, Isometric  Keep your right / left elbow at your side and bend it 90 degrees.  Step into a door frame so that the outside of your right / left wrist can press against the door frame without your upper arm leaving your side.  Gently press your right / left wrist into the door frame, as if you were trying to swing the back of your hand away from your stomach. Gradually increase the tension, until you are pressing as hard as you can, without shrugging your shoulder or increasing any shoulder discomfort.  Hold for __________ seconds.  Release the tension slowly. Relax your shoulder muscles completely before you begin the next repetition. Repeat __________ times. Complete this exercise __________ times per day.  STRENGTH - Supraspinatus   Stand or sit with good posture. Grasp a __________ weight, or an exercise band or tubing, so that your hand is "thumbs-up,"  like you are shaking hands.  Slowly lift your right / left arm in a "V" away from your thigh, diagonally into the space between your side and straight ahead. Lift your hand to shoulder height or as far as you can, without increasing any shoulder pain. At first, many people do not lift their hands above shoulder height.  Avoid shrugging your right / left shoulder as your arm rises, by keeping your shoulder blade tucked down and toward your mid-back spine.  Hold for __________ seconds. Control the descent of your hand, as you slowly return to your starting position. Repeat __________ times. Complete this exercise __________ times per day.  STRENGTH - External Rotators  Secure a rubber exercise band or tubing to a fixed object (table,  pole) so that it is at the same height as your right / left elbow when you are standing or sitting on a firm surface.  Stand or sit so that the secured exercise band is at your uninjured side.  Bend your right / left elbow 90 degrees. Place a folded towel or small pillow under your right / left arm, so that your elbow is a few inches away from your side.  Keeping the tension on the exercise band, pull it away from your body, as if pivoting on your elbow. Be sure to keep your body steady, so that the movement is coming only from your rotating shoulder.  Hold for __________ seconds. Release the tension in a controlled manner, as you return to the starting position. Repeat __________ times. Complete this exercise __________ times per day.  STRENGTH - Internal Rotators   Secure a rubber exercise band or tubing to a fixed object (table, pole) so that it is at the same height as your right / left elbow when you are standing or sitting on a firm surface.  Stand or sit so that the secured exercise band is at your right / left side.  Bend your elbow 90 degrees. Place a folded towel or small pillow under your right / left arm so that your elbow is a few inches away from  your side.  Keeping the tension on the exercise band, pull it across your body, toward your stomach. Be sure to keep your body steady, so that the movement is coming only from your rotating shoulder.  Hold for __________ seconds. Release the tension in a controlled manner, as you return to the starting position. Repeat __________ times. Complete this exercise __________ times per day.  STRENGTH - Scapular Protractors, Standing   Stand arms length away from a wall. Place your hands on the wall, keeping your elbows straight.  Begin by dropping your shoulder blades down and toward your mid-back spine.  To strengthen your protractors, keep your shoulder blades down, but slide them forward on your rib cage. It will feel as if you are lifting the back of your rib cage away from the wall. This is a subtle motion and can be challenging to complete. Ask your caregiver for further instruction, if you are not sure you are doing the exercise correctly.  Hold for __________ seconds. Slowly return to the starting position, resting the muscles completely before starting the next repetition. Repeat __________ times. Complete this exercise __________ times per day. STRENGTH - Scapular Protractors, Supine  Lie on your back on a firm surface. Extend your right / left arm straight into the air while holding a __________ weight in your hand.  Keeping your head and back in place, lift your shoulder off the floor.  Hold for __________ seconds. Slowly return to the starting position, and allow your muscles to relax completely before starting the next repetition. Repeat __________ times. Complete this exercise __________ times per day. STRENGTH - Scapular Protractors, Quadruped  Get onto your hands and knees, with your shoulders directly over your hands (or as close as you can be, comfortably).  Keeping your elbows locked, lift the back of your rib cage up into your shoulder blades, so your mid-back rounds out.  Keep your neck muscles relaxed.  Hold this position for __________ seconds. Slowly return to the starting position and allow your muscles to relax completely before starting the next repetition. Repeat __________ times. Complete this exercise __________ times per day.  STRENGTH -  Scapular Retractors  Secure a rubber exercise band or tubing to a fixed object (table, pole), so that it is at the height of your shoulders when you are either standing, or sitting on a firm armless chair.  With a palm down grip, grasp an end of the band in each hand. Straighten your elbows and lift your hands straight in front of you, at shoulder height. Step back, away from the secured end of the band, until it becomes tense.  Squeezing your shoulder blades together, draw your elbows back toward your sides, as you bend them. Keep your upper arms lifted away from your body throughout the exercise.  Hold for __________ seconds. Slowly ease the tension on the band, as you reverse the directions and return to the starting position. Repeat __________ times. Complete this exercise __________ times per day. STRENGTH - Shoulder Extensors   Secure a rubber exercise band or tubing to a fixed object (table, pole) so that it is at the height of your shoulders when you are either standing, or sitting on a firm armless chair.  With a thumbs-up grip, grasp an end of the band in each hand. Straighten your elbows and lift your hands straight in front of you, at shoulder height. Step back, away from the secured end of the band, until it becomes tense.  Squeezing your shoulder blades together, pull your hands down to the sides of your thighs. Do not allow your hands to go behind you.  Hold for __________ seconds. Slowly ease the tension on the band, as you reverse the directions and return to the starting position. Repeat __________ times. Complete this exercise __________ times per day.  STRENGTH - Scapular Retractors and External  Rotators   Secure a rubber exercise band or tubing to a fixed object (table, pole) so that it is at the height as your shoulders, when you are either standing, or sitting on a firm armless chair.  With a palm down grip, grasp an end of the band in each hand. Bend your elbows 90 degrees and lift your elbows to shoulder height, at your sides. Step back, away from the secured end of the band, until it becomes tense.  Squeezing your shoulder blades together, rotate your shoulders so that your upper arms and elbows remain stationary, but your fists travel upward to head height.  Hold for __________ seconds. Slowly ease the tension on the band, as you reverse the directions and return to the starting position. Repeat __________ times. Complete this exercise __________ times per day.  STRENGTH - Scapular Retractors and External Rotators, Rowing   Secure a rubber exercise band or tubing to a fixed object (table, pole) so that it is at the height of your shoulders, when you are either standing, or sitting on a firm armless chair.  With a palm down grip, grasp an end of the band in each hand. Straighten your elbows and lift your hands straight in front of you, at shoulder height. Step back, away from the secured end of the band, until it becomes tense.  Step 1: Squeeze your shoulder blades together. Bending your elbows, draw your hands to your chest, as if you are rowing a boat. At the end of this motion, your hands and elbow should be at shoulder height and your elbows should be out to your sides.  Step 2: Rotate your shoulders, to raise your hands above your head. Your forearms should be vertical and your upper arms should be horizontal.  Hold  for __________ seconds. Slowly ease the tension on the band, as you reverse the directions and return to the starting position. Repeat __________ times. Complete this exercise __________ times per day.  STRENGTH  Scapular Depressors  Find a sturdy chair without  wheels, such as a dining room chair.  Keeping your feet on the floor, and your hands on the chair arms, lift your bottom up from the seat, and lock your elbows.  Keeping your elbows straight, allow gravity to pull your body weight down. Your shoulders will rise toward your ears.  Raise your body against gravity by drawing your shoulder blades down your back, shortening the distance between your shoulders and ears. Although your feet should always maintain contact with the floor, your feet should progressively support less body weight, as you get stronger.  Hold for __________ seconds. In a controlled and slow manner, lower your body weight to begin the next repetition. Repeat __________ times. Complete this exercise __________ times per day.  Document Released: 06/13/2005 Document Revised: 09/05/2011 Document Reviewed: 09/25/2008 [facility] Patient Information 2014 [place], [place].

## 2013-08-08 ENCOUNTER — Institutional Professional Consult (permissible substitution): Payer: Medicare Other | Admitting: Critical Care Medicine

## 2013-08-15 ENCOUNTER — Institutional Professional Consult (permissible substitution): Payer: Medicare Other | Admitting: Critical Care Medicine

## 2013-08-23 ENCOUNTER — Other Ambulatory Visit: Payer: Self-pay | Admitting: Family Medicine

## 2013-09-17 ENCOUNTER — Ambulatory Visit (INDEPENDENT_AMBULATORY_CARE_PROVIDER_SITE_OTHER): Payer: Medicare Other | Admitting: Emergency Medicine

## 2013-09-17 ENCOUNTER — Encounter: Payer: Self-pay | Admitting: Emergency Medicine

## 2013-09-17 VITALS — BP 132/86 | HR 77 | Ht 67.5 in | Wt 305.0 lb

## 2013-09-17 DIAGNOSIS — J45909 Unspecified asthma, uncomplicated: Secondary | ICD-10-CM

## 2013-09-17 NOTE — Progress Notes (Signed)
Subjective:    Patient ID: [name] White, female    DOB: 11-May-1956, 57 y.o.   MRN: [id number]  HPI 58 yo woman, former smoker, hx HTN, GERD, sinusitis s/p sinus surgery (Dr Wilburn Cornelia), s/p partial thyroidectomy due to benign goiter (Dr Rebekah Chesterfield). Carries the dx asthma since 2006, dx by . She has severe nasal drainage. She is on zyrtec and patanase. She has not been on dexilant for a month due to insurance.  She takes h2 blocker daily. She believes she had PFT in the past when she saw Dr Joya Gaskins.   She has been hospitalized and intubated in the past.    Review of Systems  Constitutional: Negative for fever and unexpected weight change.  HENT: Positive for congestion, postnasal drip and sinus pressure. Negative for dental problem, ear pain, nosebleeds, rhinorrhea, sneezing, sore throat and trouble swallowing.   Eyes: Negative for redness and itching.  Respiratory: Positive for cough, chest tightness, shortness of breath and wheezing.   Cardiovascular: Positive for leg swelling. Negative for palpitations.  Gastrointestinal: Negative for nausea and vomiting.  Genitourinary: Negative for dysuria.  Musculoskeletal: Positive for joint swelling.  Skin: Negative for rash.  Neurological: Negative for headaches.  Hematological: Bruises/bleeds easily.  Psychiatric/Behavioral: Positive for dysphoric mood. The patient is nervous/anxious.    Past Medical History  Diagnosis Date  . Asthma   . Hypertension   . Thyroid disease   . Osteoarthritis   . GERD (gastroesophageal reflux disease)   . Anxiety   . Depression   . Headache(784.0)      Family History  Problem Relation Age of Onset  . Osteoarthritis Mother   . Asthma Mother   . Heart failure Mother      History   Social History  . Marital Status: Single    Spouse Name: N/A    Number of Children: N/A  . Years of Education: N/A   Occupational History  . Not on file.   Social History Main Topics  . Smoking status: Former Smoker  -- 0.25 packs/day for 20 years    Types: Cigarettes    Quit date: 07/17/1999  . Smokeless tobacco: Not on file  . Alcohol Use: No  . Drug Use: No  . Sexual Activity: No   Other Topics Concern  . Not on file   Social History Narrative  . No narrative on file     Allergies  Allergen Reactions  . Caffeine Other (See Comments)    Migraine  . Ciprofloxacin Hives  . Lisinopril Cough  . Vicodin [Hydrocodone-Acetaminophen] Nausea And Vomiting and Other (See Comments)    Headache   . Erythromycin Rash  . Sulfamethoxazole-Trimethoprim Rash     Outpatient Prescriptions Prior to Visit  Medication Sig Dispense Refill  . albuterol (PROVENTIL HFA;VENTOLIN HFA) 108 (90 BASE) MCG/ACT inhaler Inhale 2 puffs into the lungs every 6 (six) hours as needed. For shortness of breath      . albuterol (PROVENTIL) (2.5 MG/3ML) 0.083% nebulizer solution Take 3 mLs (2.5 mg total) by nebulization every 6 (six) hours as needed. For shortness of breath  75 mL  12  . azelastine (OPTIVAR) 0.05 % ophthalmic solution Place 1 drop into both eyes 2 (two) times daily as needed. For dry/irritated eyes       . beclomethasone (QVAR) 80 MCG/ACT inhaler Inhale 2 puffs into the lungs daily.       . budesonide-formoterol (SYMBICORT) 160-4.5 MCG/ACT inhaler Inhale 2 puffs into the lungs 2 (two) times daily.        Marland Kitchen  cetirizine (ZYRTEC) 10 MG tablet Take 1 tablet (10 mg total) by mouth every morning.  30 tablet  11  . diclofenac sodium (VOLTAREN) 1 % GEL Apply 1 application topically 2 (two) times daily as needed. On knees for pain/inflamation       . DULoxetine (CYMBALTA) 60 MG capsule Take 60 mg by mouth at bedtime.       Marland Kitchen estradiol (CLIMARA - DOSED IN MG/24 HR) 0.025 mg/24hr patch Place 1 patch onto the skin once a week. On Friday      . hydrOXYzine (ATARAX/VISTARIL) 50 MG tablet Take 50 mg by mouth at bedtime.       . metoprolol-hydrochlorothiazide (LOPRESSOR HCT) 50-25 MG per tablet TAKE 1 TABLET BY MOUTH ONCE DAILY  30  tablet  0  . Olopatadine HCl 0.6 % SOLN Place 1 puff into the nose at bedtime.       . ranitidine (ZANTAC) 300 MG tablet Take 300 mg by mouth at bedtime.       . verapamil (CALAN) 80 MG tablet Take 1 tablet (80 mg total) by mouth 3 (three) times daily.  90 tablet  0  . vitamin C (ASCORBIC ACID) 500 MG tablet Take 500 mg by mouth every morning.      . vitamin E 100 UNIT capsule Take 100 Units by mouth daily.      Marland Kitchen dexlansoprazole (DEXILANT) 60 MG capsule Take 60 mg by mouth daily.      Marland Kitchen atorvastatin (LIPITOR) 20 MG tablet Take 1 tablet (20 mg total) by mouth daily.  90 tablet  3  . tiotropium (SPIRIVA) 18 MCG inhalation capsule Place 1 capsule (18 mcg total) into inhaler and inhale daily.  30 capsule  12  . traMADol (ULTRAM) 50 MG tablet Take 1 tablet (50 mg total) by mouth every 6 (six) hours as needed.  15 tablet  0  . traZODone (DESYREL) 50 MG tablet Take 25 mg by mouth 3 times/day as needed-between meals & bedtime for sleep.        Facility-Administered Medications Prior to Visit  Medication Dose Route Frequency Provider Last Rate Last Dose  . diphenhydrAMINE (BENADRYL) capsule 50 mg  50 mg Oral Q6H PRN Minerva Ends, MD   50 mg at 01/06/12 1458         Objective:   Physical Exam Filed Vitals:   09/17/13 0952  BP: 132/86  Pulse: 77  Height: 5' 7.5" (1.715 m)  Weight: 305 lb (138.347 kg)  SpO2: 99%   Gen: Pleasant, obese, in no distress,  normal affect  ENT: No lesions,  mouth clear,  Profound UA noise, hoarse voice and dry cough  Neck: No JVD, no TMG, no carotid bruits  Lungs: No use of accessory muscles, clear without rales or rhonchi  Cardiovascular: RRR, heart sounds normal, no murmur or gallops, no peripheral edema  Musculoskeletal: No deformities, no cyanosis or clubbing  Neuro: alert, non focal  Skin: Warm, no lesions or rashes       Assessment & Plan:  ASTHMA, PERSISTENT Her clinical presentation is one much more consistent with UA irritation  syndrome. She may have some degree of asthma also. ? Whether there is a contribution of her sinus disease, or of her thyroid surgery - full PFT. - agree that she needs a sleep study - continue QVAR for now, but may be able to stop at some point - albuterol prn - need to aggressively treat GERD and PND - rov next available

## 2013-09-17 NOTE — Assessment & Plan Note (Signed)
Her clinical presentation is one much more consistent with UA irritation syndrome. She may have some degree of asthma also. ? Whether there is a contribution of her sinus disease, or of her thyroid surgery - full PFT. - agree that she needs a sleep study - continue QVAR for now, but may be able to stop at some point - albuterol prn - need to aggressively treat GERD and PND - rov next available

## 2013-09-17 NOTE — Patient Instructions (Addendum)
We will perform full breathing tests  Please restart your Dexilant every day Continue your patanase and zyrtec every day Use QVAR once a day Use albuterol 2 puffs if needed for shortness of breath We will probably refer you back to see Dr Wilburn Cornelia after your breathing tests have been reviewed.  Follow with Dr Lamonte Sakai next available with full PFT

## 2013-09-17 NOTE — Addendum Note (Signed)
Addended by: Carlos American A on: 09/17/2013 10:34 AM   Modules accepted: Orders

## 2013-09-18 ENCOUNTER — Ambulatory Visit (INDEPENDENT_AMBULATORY_CARE_PROVIDER_SITE_OTHER): Payer: Medicare Other | Admitting: Family Medicine

## 2013-09-18 ENCOUNTER — Encounter: Payer: Self-pay | Admitting: Family Medicine

## 2013-09-18 VITALS — BP 129/83 | HR 76 | Temp 97.6°F | Ht 67.5 in | Wt 302.0 lb

## 2013-09-18 DIAGNOSIS — M719 Bursopathy, unspecified: Secondary | ICD-10-CM

## 2013-09-18 DIAGNOSIS — R600 Localized edema: Secondary | ICD-10-CM

## 2013-09-18 DIAGNOSIS — I10 Essential (primary) hypertension: Secondary | ICD-10-CM

## 2013-09-18 DIAGNOSIS — J45909 Unspecified asthma, uncomplicated: Secondary | ICD-10-CM

## 2013-09-18 DIAGNOSIS — M7541 Impingement syndrome of right shoulder: Secondary | ICD-10-CM | POA: Insufficient documentation

## 2013-09-18 DIAGNOSIS — M67919 Unspecified disorder of synovium and tendon, unspecified shoulder: Secondary | ICD-10-CM

## 2013-09-18 DIAGNOSIS — R609 Edema, unspecified: Secondary | ICD-10-CM

## 2013-09-18 NOTE — Assessment & Plan Note (Signed)
Agreed to referral to [facility] given recent impengement and she's an established patient there.

## 2013-09-18 NOTE — Assessment & Plan Note (Signed)
Slight, trace to 1+ Recommended continuing current meds, HCTZ, and starting compression stiockings Rx given CCB, verapamil, may be contributing so consider re-arranging regimen to exclude this.

## 2013-09-18 NOTE — Progress Notes (Signed)
Patient ID: [name] White, female   DOB: 1955-08-30, 58 y.o.   MRN: [id number]  [name], MD Phone: [phone]  Subjective:  Chief complaint-noted  # Patient here followup for hypertension, also request for referrals  Hypertension States that it's been elevated at her recent visit to [facility] and again at pulmonology clinic. She does not check it at home She is compliant with all her medications When she had it checked at urgent care she was in a lot of pain She denies headache, chest pain, and palpitations  she's also concerned about her lower extremity edema  She like to ENT referral because he states her pulmonologist feels her tonsils may be causing OSA She like an Ortho referral because she like to followup from the rotator cuff impingement she had urgent care, she has an established orthopedic surgeon  ROS- No fevers, chills, sweats Dyspnea improving with new meds.   Past Medical History Patient Active Problem List   Diagnosis Date Noted  . Lower extremity edema 09/18/2013  . Rotator cuff impingement syndrome of right shoulder 09/18/2013  . Mixed headache 01/06/2012  . Hypothyroidism 11/01/2011  . OVERACTIVE BLADDER 04/18/2008  . OSTEOARTHRITIS, KNEES, BILATERAL 04/18/2008  . POLYARTHRITIS 09/14/2007  . OBESITY, NOS 08/24/2006  . DEPRESSIVE DISORDER, NOS 08/24/2006  . RESTLESS LEGS SYNDROME 08/24/2006  . HYPERTENSION, BENIGN SYSTEMIC 08/24/2006  . RHINITIS, ALLERGIC 08/24/2006  . ASTHMA, PERSISTENT 08/24/2006  . GASTROESOPHAGEAL REFLUX, NO ESOPHAGITIS 08/24/2006    Medications- reviewed and updated Current Outpatient Prescriptions  Medication Sig Dispense Refill  . albuterol (PROVENTIL HFA;VENTOLIN HFA) 108 (90 BASE) MCG/ACT inhaler Inhale 2 puffs into the lungs every 6 (six) hours as needed. For shortness of breath      . albuterol (PROVENTIL) (2.5 MG/3ML) 0.083% nebulizer solution Take 3 mLs (2.5 mg total) by nebulization every 6 (six) hours as  needed. For shortness of breath  75 mL  12  . azelastine (OPTIVAR) 0.05 % ophthalmic solution Place 1 drop into both eyes 2 (two) times daily as needed. For dry/irritated eyes       . beclomethasone (QVAR) 80 MCG/ACT inhaler Inhale 2 puffs into the lungs daily.       . budesonide-formoterol (SYMBICORT) 160-4.5 MCG/ACT inhaler Inhale 2 puffs into the lungs 2 (two) times daily.        . cetirizine (ZYRTEC) 10 MG tablet Take 1 tablet (10 mg total) by mouth every morning.  30 tablet  11  . dexlansoprazole (DEXILANT) 60 MG capsule Take 60 mg by mouth daily.      . diclofenac sodium (VOLTAREN) 1 % GEL Apply 1 application topically 2 (two) times daily as needed. On knees for pain/inflamation       . DULoxetine (CYMBALTA) 60 MG capsule Take 60 mg by mouth at bedtime.       Marland Kitchen estradiol (CLIMARA - DOSED IN MG/24 HR) 0.025 mg/24hr patch Place 1 patch onto the skin once a week. On Friday      . hydrOXYzine (ATARAX/VISTARIL) 50 MG tablet Take 50 mg by mouth at bedtime.       . metoprolol-hydrochlorothiazide (LOPRESSOR HCT) 50-25 MG per tablet TAKE 1 TABLET BY MOUTH ONCE DAILY  30 tablet  0  . Olopatadine HCl 0.6 % SOLN Place 1 puff into the nose at bedtime.       . ranitidine (ZANTAC) 300 MG tablet Take 300 mg by mouth at bedtime.       . verapamil (CALAN) 80 MG tablet Take 1 tablet (80  mg total) by mouth 3 (three) times daily.  90 tablet  0  . vitamin C (ASCORBIC ACID) 500 MG tablet Take 500 mg by mouth every morning.      . vitamin E 100 UNIT capsule Take 100 Units by mouth daily.       Current Facility-Administered Medications  Medication Dose Route Frequency Provider Last Rate Last Dose  . diphenhydrAMINE (BENADRYL) capsule 50 mg  50 mg Oral Q6H PRN Minerva Ends, MD   50 mg at 01/06/12 1458    Objective: BP 129/83  Pulse 76  Temp(Src) 97.6 F (36.4 C) (Oral)  Ht 5' 7.5" (1.715 m)  Wt 302 lb (136.986 kg)  BMI 46.57 kg/m2 Gen: NAD, alert, cooperative with exam HEENT: NCAT, EOMI, PERRL CV:  RRR, good S1/S2, no murmur Resp: CTABL, no wheezes, non-labored Abd: SNTND, BS present, no guarding or organomegaly Ext: trace to 1+ LE edema Neuro: Alert and oriented, No gross deficits   Assessment/Plan:  ASTHMA, PERSISTENT Managed currently by Dr. Lamonte Sakai, pulmonology Agreed to referral to ENT for eval of tonsils for possible contribution to airway obstruction  Lower extremity edema Slight, trace to 1+ Recommended continuing current meds, HCTZ, and starting compression stiockings Rx given CCB, verapamil, may be contributing so consider re-arranging regimen to exclude this.   HYPERTENSION, BENIGN SYSTEMIC Well controlled today No red flags One elevated reading at [facility] when she was in pain No new meds, continue metoprolol, HCTZ, and verapamil Would avoid higher doses of metoprolol with asthma.   Rotator cuff impingement syndrome of right shoulder Agreed to referral to [facility] given recent impengement and she's an established patient there.     Orders Placed This Encounter  Procedures  . Compression stockings  . Ambulatory referral to Orthopedic Surgery    Referral Priority:  Routine    Referral Type:  Surgical    Referral Reason:  Specialty Services Required    Requested Specialty:  Orthopedic Surgery    Number of Visits Requested:  1  . Ambulatory referral to ENT    Referral Priority:  Routine    Referral Type:  Consultation    Referral Reason:  Specialty Services Required    Requested Specialty:  Otolaryngology    Number of Visits Requested:  1  . Ambulatory referral to ENT    Referral Priority:  Routine    Referral Type:  Consultation    Referral Reason:  Specialty Services Required    Requested Specialty:  Otolaryngology    Number of Visits Requested:  1    No orders of the defined types were placed in this encounter.

## 2013-09-18 NOTE — Patient Instructions (Signed)
Great to meet you today!!  I think you should try some compression stockings for your swelling, your blood pressure looks goo.   i have written the referrals you asked for.

## 2013-09-18 NOTE — Assessment & Plan Note (Signed)
Well controlled today No red flags One elevated reading at [facility] when she was in pain No new meds, continue metoprolol, HCTZ, and verapamil Would avoid higher doses of metoprolol with asthma.

## 2013-09-18 NOTE — Assessment & Plan Note (Signed)
Managed currently by Dr. Lamonte Sakai, pulmonology Agreed to referral to ENT for eval of tonsils for possible contribution to airway obstruction

## 2013-09-19 ENCOUNTER — Telehealth: Payer: Self-pay | Admitting: Family Medicine

## 2013-09-19 NOTE — Telephone Encounter (Signed)
Tried to contact pt x 2 to give appt inf ([facility])   [place]

## 2013-09-26 ENCOUNTER — Other Ambulatory Visit: Payer: Self-pay | Admitting: Family Medicine

## 2013-09-30 ENCOUNTER — Other Ambulatory Visit: Payer: Self-pay | Admitting: *Deleted

## 2013-10-01 ENCOUNTER — Other Ambulatory Visit: Payer: Self-pay | Admitting: Family Medicine

## 2013-10-01 MED ORDER — METOPROLOL-HYDROCHLOROTHIAZIDE 50-25 MG PO TABS: 1.0000 | ORAL_TABLET | Freq: Every day | ORAL | Status: DC

## 2013-10-07 ENCOUNTER — Ambulatory Visit: Payer: Medicare Other | Admitting: Emergency Medicine

## 2013-10-07 ENCOUNTER — Inpatient Hospital Stay (HOSPITAL_COMMUNITY): Admission: RE | Admit: 2013-10-07 | Payer: Medicare Other | Source: Ambulatory Visit

## 2013-10-07 ENCOUNTER — Telehealth: Payer: Self-pay | Admitting: Internal Medicine

## 2013-10-07 NOTE — Telephone Encounter (Signed)
Opened in error/spm °

## 2013-10-31 ENCOUNTER — Other Ambulatory Visit: Payer: Self-pay | Admitting: Orthopedic Surgery

## 2013-10-31 DIAGNOSIS — M25511 Pain in right shoulder: Secondary | ICD-10-CM

## 2013-11-05 ENCOUNTER — Inpatient Hospital Stay: Admission: RE | Admit: 2013-11-05 | Payer: Medicare Other | Source: Ambulatory Visit

## 2013-11-11 ENCOUNTER — Other Ambulatory Visit: Payer: Medicare Other

## 2013-11-12 ENCOUNTER — Ambulatory Visit (INDEPENDENT_AMBULATORY_CARE_PROVIDER_SITE_OTHER): Payer: Medicare HMO | Admitting: Emergency Medicine

## 2013-11-12 ENCOUNTER — Encounter: Payer: Self-pay | Admitting: Emergency Medicine

## 2013-11-12 VITALS — BP 128/84 | HR 98 | Ht 65.0 in | Wt 301.0 lb

## 2013-11-12 DIAGNOSIS — J45909 Unspecified asthma, uncomplicated: Secondary | ICD-10-CM

## 2013-11-12 DIAGNOSIS — J383 Other diseases of vocal cords: Secondary | ICD-10-CM | POA: Insufficient documentation

## 2013-11-12 NOTE — Patient Instructions (Addendum)
Please continue your Dexilant and zantac Please continue zantac and patonase  STOP your Symbicort and QVAR We will consider having Dr Wilburn Cornelia look again at your posterior pharynx Keep your albuterol available to use 2 puffs as needed for shortness of breath Follow with Dr Lamonte Sakai in 1 month

## 2013-11-12 NOTE — Assessment & Plan Note (Addendum)
This seems to be mostly UA in nature by history and now supported by her spirometry. If she has asthma it a small component of this process. I believe the risk/benefit at this point it to stop her BD's to prevent UA irritation, continue the GERD and allergy regimens. We will refer to Dr Wilburn Cornelia depending on how she is doing next month.

## 2013-11-12 NOTE — Progress Notes (Signed)
PFT done today. 

## 2013-11-12 NOTE — Progress Notes (Signed)
Subjective:    Patient ID: [name] White, female    DOB: [birth year], 58 y.o.   MRN: [id number]  HPI 58 yo woman, former smoker, hx HTN, GERD, sinusitis s/p sinus surgery (Dr Wilburn Cornelia), s/p partial thyroidectomy due to benign goiter (Dr Rebekah Chesterfield). Carries the dx asthma since 2006, dx by . She has severe nasal drainage. She is on zyrtec and patanase. She has not been on dexilant for a month due to insurance.  She takes h2 blocker daily. She believes she had PFT in the past when she saw Dr Joya Gaskins.   She has been hospitalized and intubated in the past.   ROV 11/12/13 -- returns to follow for dyspnea and suspected asthma. PFT's today are normal but the flow-volume loop suggests mild AFL. Her sx are far out of preportion to her PFT. She feels that her sx focus on her throat, vocal changes, drainage.  She has GERD and allergies, both of which are tough to control.    Review of Systems  Constitutional: Negative for fever and unexpected weight change.  HENT: Positive for congestion, postnasal drip and sinus pressure. Negative for dental problem, ear pain, nosebleeds, rhinorrhea, sneezing, sore throat and trouble swallowing.   Eyes: Negative for redness and itching.  Respiratory: Positive for cough, chest tightness, shortness of breath and wheezing.   Cardiovascular: Positive for leg swelling. Negative for palpitations.  Gastrointestinal: Negative for nausea and vomiting.  Genitourinary: Negative for dysuria.  Musculoskeletal: Positive for joint swelling.  Skin: Negative for rash.  Neurological: Negative for headaches.  Hematological: Bruises/bleeds easily.  Psychiatric/Behavioral: Positive for dysphoric mood. The patient is nervous/anxious.    Past Medical History  Diagnosis Date  . Asthma   . Hypertension   . Thyroid disease   . Osteoarthritis   . GERD (gastroesophageal reflux disease)   . Anxiety   . Depression   . Headache(784.0)      Family History  Problem Relation Age of Onset   . Osteoarthritis Mother   . Asthma Mother   . Heart failure Mother      History   Social History  . Marital Status: Single    Spouse Name: N/A    Number of Children: N/A  . Years of Education: N/A   Occupational History  . Not on file.   Social History Main Topics  . Smoking status: Former Smoker -- 0.25 packs/day for 20 years    Types: Cigarettes    Quit date: 07/17/1999  . Smokeless tobacco: Not on file  . Alcohol Use: No  . Drug Use: No  . Sexual Activity: No   Other Topics Concern  . Not on file   Social History Narrative  . No narrative on file     Allergies  Allergen Reactions  . Caffeine Other (See Comments)    Migraine  . Ciprofloxacin Hives  . Lisinopril Cough  . Vicodin [Hydrocodone-Acetaminophen] Nausea And Vomiting and Other (See Comments)    Headache   . Erythromycin Rash  . Sulfamethoxazole-Trimethoprim Rash     Outpatient Prescriptions Prior to Visit  Medication Sig Dispense Refill  . albuterol (PROVENTIL HFA;VENTOLIN HFA) 108 (90 BASE) MCG/ACT inhaler Inhale 2 puffs into the lungs every 6 (six) hours as needed. For shortness of breath      . albuterol (PROVENTIL) (2.5 MG/3ML) 0.083% nebulizer solution Take 3 mLs (2.5 mg total) by nebulization every 6 (six) hours as needed. For shortness of breath  75 mL  12  . azelastine (OPTIVAR) 0.05 %  ophthalmic solution Place 1 drop into both eyes 2 (two) times daily as needed. For dry/irritated eyes       . beclomethasone (QVAR) 80 MCG/ACT inhaler Inhale 2 puffs into the lungs daily.       . budesonide-formoterol (SYMBICORT) 160-4.5 MCG/ACT inhaler Inhale 2 puffs into the lungs 2 (two) times daily.        . cetirizine (ZYRTEC) 10 MG tablet Take 1 tablet (10 mg total) by mouth every morning.  30 tablet  11  . dexlansoprazole (DEXILANT) 60 MG capsule Take 60 mg by mouth daily.      . diclofenac sodium (VOLTAREN) 1 % GEL Apply 1 application topically 2 (two) times daily as needed. On knees for pain/inflamation        . DULoxetine (CYMBALTA) 60 MG capsule Take 60 mg by mouth at bedtime.       . metoprolol-hydrochlorothiazide (LOPRESSOR HCT) 50-25 MG per tablet TAKE 1 TABLET BY MOUTH DAILY  90 tablet  6  . Olopatadine HCl 0.6 % SOLN Place 1 puff into the nose at bedtime.       . ranitidine (ZANTAC) 300 MG tablet Take 300 mg by mouth at bedtime.       . verapamil (CALAN) 80 MG tablet Take 1 tablet (80 mg total) by mouth 3 (three) times daily.  90 tablet  0  . vitamin C (ASCORBIC ACID) 500 MG tablet Take 500 mg by mouth every morning.      . vitamin E 100 UNIT capsule Take 100 Units by mouth daily.      Marland Kitchen estradiol (CLIMARA - DOSED IN MG/24 HR) 0.025 mg/24hr patch Place 1 patch onto the skin once a week. On Friday      . hydrOXYzine (ATARAX/VISTARIL) 50 MG tablet Take 50 mg by mouth at bedtime.        Facility-Administered Medications Prior to Visit  Medication Dose Route Frequency Provider Last Rate Last Dose  . diphenhydrAMINE (BENADRYL) capsule 50 mg  50 mg Oral Q6H PRN Minerva Ends, MD   50 mg at 01/06/12 1458         Objective:   Physical Exam Filed Vitals:   11/12/13 1620  BP: 128/84  Pulse: 98  Height: 5\' 5"  (1.651 m)  Weight: 301 lb (136.533 kg)  SpO2: 98%   Gen: Pleasant, obese, in no distress,  normal affect  ENT: No lesions,  mouth clear,  Profound UA noise, hoarse voice and dry cough  Neck: No JVD, no TMG, no carotid bruits  Lungs: No use of accessory muscles, clear without rales or rhonchi  Cardiovascular: RRR, heart sounds normal, no murmur or gallops, no peripheral edema  Musculoskeletal: No deformities, no cyanosis or clubbing  Neuro: alert, non focal  Skin: Warm, no lesions or rashes       Assessment & Plan:  Vocal cord dysfunction This seems to be mostly UA in nature by history and now supported by her spirometry. If she has asthma it a small component of this process. I believe the risk/benefit at this point it to stop her BD's to prevent UA irritation,  continue the GERD and allergy regimens. We will refer to Dr Wilburn Cornelia depending on how she is doing next month.

## 2013-11-20 ENCOUNTER — Encounter (HOSPITAL_COMMUNITY): Payer: Self-pay | Admitting: Emergency Medicine

## 2013-11-20 ENCOUNTER — Emergency Department (INDEPENDENT_AMBULATORY_CARE_PROVIDER_SITE_OTHER)
Admission: EM | Admit: 2013-11-20 | Discharge: 2013-11-20 | Disposition: A | Discharge status: Home / Self Care | Payer: Medicare HMO | Source: Home / Self Care | Attending: Family Medicine | Admitting: Family Medicine

## 2013-11-20 DIAGNOSIS — M722 Plantar fascial fibromatosis: Secondary | ICD-10-CM

## 2013-11-20 MED ORDER — OXYCODONE-ACETAMINOPHEN 5-325 MG PO TABS: 2.0000 | ORAL_TABLET | ORAL | Status: DC | PRN

## 2013-11-20 NOTE — ED Notes (Signed)
Pt    Reports  Pain  r    Heel        Treated  For a  Heel  Spur            sev  Weeks  Ago  By  Dr  Percell Miller         Pt  Reports  Was  rx  voltaren gel   And  Ultram  She  Is  Out  Of the  Ultram           Now  The  Pain is  Radiating up the leg  She  denys  Any  Chest pain or  Shortness  Of  Breath

## 2013-11-20 NOTE — ED Provider Notes (Signed)
CSN: [id number]     Arrival date & time 11/20/13  1930 History   First MD Initiated Contact with Patient 11/20/13 2007     Chief Complaint  Patient presents with  . Foot Pain   (Consider location/radiation/quality/duration/timing/severity/associated sxs/prior Treatment) Patient is a 58 y.o. female presenting with lower extremity pain. The history is provided by the patient.  Foot Pain This is a recurrent problem. The problem occurs constantly. The problem has been rapidly worsening. Nothing aggravates the symptoms. Nothing relieves the symptoms. She has tried a warm compress for the symptoms. The treatment provided no relief.    Past Medical History  Diagnosis Date  . Asthma   . Hypertension   . Thyroid disease   . Osteoarthritis   . GERD (gastroesophageal reflux disease)   . Anxiety   . Depression   . UDJSHFWY(637.8)    Past Surgical History  Procedure Laterality Date  . Abdominal hysterectomy    . Cystectomy    . Mouth surgery    . Rectal surgery    . Thyroidectomy, partial  2005    Rt side  . Nasal sinus surgery  2007   Family History  Problem Relation Age of Onset  . Osteoarthritis Mother   . Asthma Mother   . Heart failure Mother    History  Substance Use Topics  . Smoking status: Former Smoker -- 0.25 packs/day for 20 years    Types: Cigarettes    Quit date: 07/17/1999  . Smokeless tobacco: Not on file  . Alcohol Use: No   OB History   Grav Para Term Preterm Abortions TAB SAB Ect Mult Living                 Review of Systems  All other systems reviewed and are negative.   Allergies  Caffeine; Ciprofloxacin; Lisinopril; Vicodin; Erythromycin; and Sulfamethoxazole-trimethoprim  Home Medications   Prior to Admission medications   Medication Sig Start Date End Date Taking? Authorizing Provider  albuterol (PROVENTIL HFA;VENTOLIN HFA) 108 (90 BASE) MCG/ACT inhaler Inhale 2 puffs into the lungs every 6 (six) hours as needed. For shortness of breath     Historical Provider, MD  albuterol (PROVENTIL) (2.5 MG/3ML) 0.083% nebulizer solution Take 3 mLs (2.5 mg total) by nebulization every 6 (six) hours as needed. For shortness of breath 04/07/13   Hilton Sinclair, MD  ARIPiprazole (ABILIFY) 2 MG tablet Take 2 mg by mouth daily.    Historical Provider, MD  azelastine (OPTIVAR) 0.05 % ophthalmic solution Place 1 drop into both eyes 2 (two) times daily as needed. For dry/irritated eyes     Historical Provider, MD  beclomethasone (QVAR) 80 MCG/ACT inhaler Inhale 2 puffs into the lungs daily.     Historical Provider, MD  budesonide-formoterol (SYMBICORT) 160-4.5 MCG/ACT inhaler Inhale 2 puffs into the lungs 2 (two) times daily.      Historical Provider, MD  cetirizine (ZYRTEC) 10 MG tablet Take 1 tablet (10 mg total) by mouth every morning. 04/07/13   Hilton Sinclair, MD  dexlansoprazole (DEXILANT) 60 MG capsule Take 60 mg by mouth daily.    Historical Provider, MD  diclofenac sodium (VOLTAREN) 1 % GEL Apply 1 application topically 2 (two) times daily as needed. On knees for pain/inflamation     Historical Provider, MD  DULoxetine (CYMBALTA) 60 MG capsule Take 60 mg by mouth at bedtime.     Historical Provider, MD  metoprolol-hydrochlorothiazide (LOPRESSOR HCT) 50-25 MG per tablet TAKE 1 TABLET BY MOUTH DAILY 10/01/13  Renee A Kuneff, DO  Olopatadine HCl 0.6 % SOLN Place 1 puff into the nose at bedtime.     Historical Provider, MD  oxyCODONE-acetaminophen (PERCOCET/ROXICET) 5-325 MG per tablet Take 2 tablets by mouth every 4 (four) hours as needed for severe pain. 11/20/13   Fransico Meadow, PA-C  ranitidine (ZANTAC) 300 MG tablet Take 300 mg by mouth at bedtime.     Historical Provider, MD  traZODone (DESYREL) 50 MG tablet Take 50 mg by mouth at bedtime.    Historical Provider, MD  verapamil (CALAN) 80 MG tablet Take 1 tablet (80 mg total) by mouth 3 (three) times daily. 04/02/13   Renee A Kuneff, DO  vitamin C (ASCORBIC ACID) 500 MG tablet Take 500  mg by mouth every morning.    Historical Provider, MD  vitamin E 100 UNIT capsule Take 100 Units by mouth daily.    Historical Provider, MD   BP 125/95  Pulse 135  Temp(Src) 98.2 F (36.8 C) (Oral)  Resp 18  SpO2 95% Physical Exam  Constitutional: She is oriented to person, place, and time. She appears well-developed and well-nourished.  Musculoskeletal: She exhibits tenderness.  Tender right heel  Neurological: She is alert and oriented to person, place, and time. She has normal reflexes.  Skin: Skin is warm.  Psychiatric: She has a normal mood and affect.    ED Course  Procedures (including critical care time) Labs Review Labs Reviewed - No data to display  Imaging Review No results found.   MDM   1. Plantar fasciitis    Discussed shoes, heel cups,  Tennis ball massage Follow up with Dr. Noemi Chapel for recheck    Fransico Meadow, PA-C 11/20/13 2058

## 2013-11-20 NOTE — Discharge Instructions (Signed)
Heel Spur °A heel spur is a hook of bone that can form on the calcaneus (the heel bone and the largest bone of the foot). Heel spurs are often associated with plantar fasciitis and usually come in people who have had the problem for an extended period of time. The cause of the relationship is unknown. The pain associated with them is thought to be caused by an inflammation (soreness and redness) of the plantar fascia rather than the spur itself. The plantar fascia is a thick fibrous like tissue that runs from the calcaneus (heel bone) to the ball of the foot. This strong, tight tissue helps maintain the arch of your foot. It helps distribute the weight across your foot as you walk or run. Stresses placed on the plantar fascia can be tremendous. When it is inflamed normal activities become painful. Pain is worse in the morning after sleeping. After sleeping the plantar fascia is tight. The first movements stretch the fascia and this causes pain. As the tendon loosens, the pain usually gets better. It often returns with too much standing or walking.  °About 70% of patients with plantar fasciitis have a heel spur. About half of people without foot pain also have heel spurs. °DIAGNOSIS  °The diagnosis of a heel spur is made by X-ray. The X-ray shows a hook of bone protruding from the bottom of the calcaneus at the point where the plantar fascia is attached to the heel bone.  °TREATMENT °· It is necessary to find out what is causing the stretching of the plantar fascia. If the cause is over-pronation (flat feet), orthotics and proper foot ware may help. °· Stretching exercises, losing weight, wearing shoes that have a cushioned heel that absorbs shock, and elevating the heel with the use of a heel cradle, heel cup, or orthotics may all help. Heel cradles and heel cups provide extra comfort and cushion to the heel, and reduce the amount of shock to the sore area. °AVOIDING THE PAIN OF PLANTAR FASCIITIS AND HEEL  SPURS °· Consult a sports medicine professional before beginning a new exercise program. °· Walking programs offer a good workout. There is a lower chance of overuse injuries common to the runners. There is less impact and less jarring of the joints. °· Begin all new exercise programs slowly. If problems or pains develop, decrease the amount of time or distance until you are at a comfortable level. °· Wear good shoes and replace them regularly. °· Stretch your foot and the heel cords at the back of the ankle (Achilles tendons) both before and after exercise. °· Run or exercise on even surfaces that are not hard. For example, asphalt is better than pavement. °· Do not run barefoot on hard surfaces. °· If using a treadmill, vary the incline. °· Do not continue to workout if you have foot or joint problems. Seek professional help if they do not improve. °HOME CARE INSTRUCTIONS  °· Avoid activities that cause you pain until you recover. °· Use ice or cold packs to the problem or painful areas after working out. °· Only take over-the-counter or prescription medicines for pain, discomfort, or fever as directed by your caregiver. °· Soft shoe inserts or athletic shoes with air or gel sole cushions may be helpful. °· If problems continue or become more severe, consult a sports medicine caregiver. Cortisone is a potent anti-inflammatory medication that may be injected into the painful area. You can discuss this treatment with your caregiver. °MAKE SURE YOU:  °·   Understand these instructions.  Will watch your condition.  Will get help right away if you are not doing well or get worse. Document Released: 07/20/2005 Document Revised: 09/05/2011 Document Reviewed: 09/21/2005 [facility] Patient Information 2014 [place].

## 2013-12-03 ENCOUNTER — Telehealth: Payer: Self-pay | Admitting: Emergency Medicine

## 2013-12-03 NOTE — Telephone Encounter (Signed)
Pt returning call.[name] White ° °

## 2013-12-03 NOTE — Telephone Encounter (Signed)
lmomtcb x1 

## 2013-12-03 NOTE — Telephone Encounter (Signed)
Per OV 11/12/13; Patient Instructions      Please continue your Dexilant and zantac Please continue zantac and patonase   STOP your Symbicort and QVAR We will consider having Dr Wilburn Cornelia look again at your posterior pharynx Keep your albuterol available to use 2 puffs as needed for shortness of breath Follow with Dr Lamonte Sakai in 1 month  --  I called spoke with pt. She restarted the symbicort 11/20/13. She feels better and is able to be outside more since getting back on this. She no longer has the constant need for the albuterol inhaler and neb. She did okay off the symbicort x 1 week but then the SOB worsened. FYI for RB.

## 2013-12-03 NOTE — Telephone Encounter (Signed)
Thank you - let her know I agree with being back on the symbicort as long as it isn't making her cough

## 2013-12-04 NOTE — Telephone Encounter (Signed)
lomtcb x2

## 2013-12-05 NOTE — Telephone Encounter (Signed)
lmomtcb x 3  

## 2013-12-05 NOTE — Telephone Encounter (Signed)
Pt has returned call. °

## 2013-12-05 NOTE — Telephone Encounter (Signed)
I called made pt aware of the below. She denies any cough at this time. Nothing further needed

## 2013-12-10 ENCOUNTER — Ambulatory Visit: Payer: Medicare HMO | Admitting: Emergency Medicine

## 2013-12-23 ENCOUNTER — Emergency Department (HOSPITAL_COMMUNITY)
Admission: EM | Admit: 2013-12-23 | Discharge: 2013-12-23 | Disposition: A | Discharge status: Home / Self Care | Payer: Medicare HMO | Attending: Emergency Medicine | Admitting: Emergency Medicine

## 2013-12-23 ENCOUNTER — Encounter (HOSPITAL_COMMUNITY): Payer: Self-pay | Admitting: Emergency Medicine

## 2013-12-23 DIAGNOSIS — Z87891 Personal history of nicotine dependence: Secondary | ICD-10-CM | POA: Insufficient documentation

## 2013-12-23 DIAGNOSIS — M65839 Other synovitis and tenosynovitis, unspecified forearm: Secondary | ICD-10-CM | POA: Insufficient documentation

## 2013-12-23 DIAGNOSIS — Z79899 Other long term (current) drug therapy: Secondary | ICD-10-CM | POA: Diagnosis not present

## 2013-12-23 DIAGNOSIS — J45909 Unspecified asthma, uncomplicated: Secondary | ICD-10-CM | POA: Insufficient documentation

## 2013-12-23 DIAGNOSIS — Z8639 Personal history of other endocrine, nutritional and metabolic disease: Secondary | ICD-10-CM | POA: Insufficient documentation

## 2013-12-23 DIAGNOSIS — I10 Essential (primary) hypertension: Secondary | ICD-10-CM | POA: Insufficient documentation

## 2013-12-23 DIAGNOSIS — K219 Gastro-esophageal reflux disease without esophagitis: Secondary | ICD-10-CM | POA: Diagnosis not present

## 2013-12-23 DIAGNOSIS — F3289 Other specified depressive episodes: Secondary | ICD-10-CM | POA: Diagnosis not present

## 2013-12-23 DIAGNOSIS — M65849 Other synovitis and tenosynovitis, unspecified hand: Principal | ICD-10-CM

## 2013-12-23 DIAGNOSIS — F411 Generalized anxiety disorder: Secondary | ICD-10-CM | POA: Insufficient documentation

## 2013-12-23 DIAGNOSIS — F329 Major depressive disorder, single episode, unspecified: Secondary | ICD-10-CM | POA: Diagnosis not present

## 2013-12-23 DIAGNOSIS — M779 Enthesopathy, unspecified: Secondary | ICD-10-CM

## 2013-12-23 DIAGNOSIS — Z862 Personal history of diseases of the blood and blood-forming organs and certain disorders involving the immune mechanism: Secondary | ICD-10-CM | POA: Insufficient documentation

## 2013-12-23 DIAGNOSIS — M1990 Unspecified osteoarthritis, unspecified site: Secondary | ICD-10-CM | POA: Diagnosis not present

## 2013-12-23 DIAGNOSIS — M25579 Pain in unspecified ankle and joints of unspecified foot: Secondary | ICD-10-CM | POA: Diagnosis present

## 2013-12-23 MED ORDER — IBUPROFEN 600 MG PO TABS: 600.0000 mg | ORAL_TABLET | Freq: Four times a day (QID) | ORAL | Status: DC | PRN

## 2013-12-23 NOTE — ED Notes (Signed)
Per pt, ankle and heel pain to rt foot.  Started x 2 months ago.  Also states pain is radiating up back of leg.

## 2013-12-23 NOTE — ED Provider Notes (Signed)
CSN: [id number]     Arrival date & time 12/23/13  1535 History   First MD Initiated Contact with Patient 12/23/13 1742     Chief Complaint  Patient presents with  . Ankle Pain  . Foot Pain     (Consider location/radiation/quality/duration/timing/severity/associated sxs/prior Treatment) HPI Comments: Patient presents to the emergency department with chief complaint of right foot and ankle pain. She states the pain started approximately 2 months ago. She denies any new injuries. She states that she had an x-ray done about a month ago, which was unremarkable. She was recently diagnosed with Achilles tendinitis by her orthopedic doctor. She states that the symptoms are the same, as when she saw him. She states the pain is worse in the morning when she first gets up. She states it is also bad in the evenings after she gets home from work. She states that she stands and walks all day. She has tried taking tramadol with no relief.  The history is provided by the patient. No language interpreter was used.    Past Medical History  Diagnosis Date  . Asthma   . Hypertension   . Thyroid disease   . Osteoarthritis   . GERD (gastroesophageal reflux disease)   . Anxiety   . Depression   . [id number](086.7)    Past Surgical History  Procedure Laterality Date  . Abdominal hysterectomy    . Cystectomy    . Mouth surgery    . Rectal surgery    . Thyroidectomy, partial  2005    Rt side  . Nasal sinus surgery  2007   Family History  Problem Relation Age of Onset  . Osteoarthritis Mother   . Asthma Mother   . Heart failure Mother    History  Substance Use Topics  . Smoking status: Former Smoker -- 0.25 packs/day for 20 years    Types: Cigarettes    Quit date: 07/17/1999  . Smokeless tobacco: Not on file  . Alcohol Use: No   OB History   Grav Para Term Preterm Abortions TAB SAB Ect Mult Living                 Review of Systems  All other systems reviewed and are  negative.     Allergies  Caffeine; Ciprofloxacin; Lisinopril; Vicodin; Erythromycin; and Sulfamethoxazole-trimethoprim  Home Medications   Prior to Admission medications   Medication Sig Start Date End Date Taking? Authorizing Provider  albuterol (PROVENTIL HFA;VENTOLIN HFA) 108 (90 BASE) MCG/ACT inhaler Inhale 2 puffs into the lungs every 6 (six) hours as needed. For shortness of breath    Historical Provider, MD  albuterol (PROVENTIL) (2.5 MG/3ML) 0.083% nebulizer solution Take 3 mLs (2.5 mg total) by nebulization every 6 (six) hours as needed. For shortness of breath 04/07/13   Hilton Sinclair, MD  ARIPiprazole (ABILIFY) 2 MG tablet Take 2 mg by mouth daily.    Historical Provider, MD  azelastine (OPTIVAR) 0.05 % ophthalmic solution Place 1 drop into both eyes 2 (two) times daily as needed. For dry/irritated eyes     Historical Provider, MD  beclomethasone (QVAR) 80 MCG/ACT inhaler Inhale 2 puffs into the lungs daily.     Historical Provider, MD  budesonide-formoterol (SYMBICORT) 160-4.5 MCG/ACT inhaler Inhale 2 puffs into the lungs 2 (two) times daily.      Historical Provider, MD  cetirizine (ZYRTEC) 10 MG tablet Take 1 tablet (10 mg total) by mouth every morning. 04/07/13   Hilton Sinclair, MD  dexlansoprazole (  DEXILANT) 60 MG capsule Take 60 mg by mouth daily.    Historical Provider, MD  diclofenac sodium (VOLTAREN) 1 % GEL Apply 1 application topically 2 (two) times daily as needed. On knees for pain/inflamation     Historical Provider, MD  DULoxetine (CYMBALTA) 60 MG capsule Take 60 mg by mouth at bedtime.     Historical Provider, MD  ibuprofen (ADVIL,MOTRIN) 600 MG tablet Take 1 tablet (600 mg total) by mouth every 6 (six) hours as needed. 12/23/13   Montine Circle, PA-C  metoprolol-hydrochlorothiazide (LOPRESSOR HCT) 50-25 MG per tablet TAKE 1 TABLET BY MOUTH DAILY 10/01/13   Renee A Kuneff, DO  Olopatadine HCl 0.6 % SOLN Place 1 puff into the nose at bedtime.      Historical Provider, MD  oxyCODONE-acetaminophen (PERCOCET/ROXICET) 5-325 MG per tablet Take 2 tablets by mouth every 4 (four) hours as needed for severe pain. 11/20/13   Fransico Meadow, PA-C  ranitidine (ZANTAC) 300 MG tablet Take 300 mg by mouth at bedtime.     Historical Provider, MD  traZODone (DESYREL) 50 MG tablet Take 50 mg by mouth at bedtime.    Historical Provider, MD  verapamil (CALAN) 80 MG tablet Take 1 tablet (80 mg total) by mouth 3 (three) times daily. 04/02/13   Renee A Kuneff, DO  vitamin C (ASCORBIC ACID) 500 MG tablet Take 500 mg by mouth every morning.    Historical Provider, MD  vitamin E 100 UNIT capsule Take 100 Units by mouth daily.    Historical Provider, MD   BP 108/53  Pulse 116  Temp(Src) 98.5 F (36.9 C) (Oral)  Resp 18  SpO2 99% Physical Exam  Nursing note and vitals reviewed. Constitutional: She is oriented to person, place, and time. She appears well-developed and well-nourished.  HENT:  Head: Normocephalic and atraumatic.  Eyes: Conjunctivae and EOM are normal.  Neck: Normal range of motion.  Cardiovascular: Normal rate.   Intact distal pulses with brisk capillary refill  Pulmonary/Chest: Effort normal.  Abdominal: She exhibits no distension.  Musculoskeletal: Normal range of motion.  Right ankle mildly swollen and tender to palpation over the calcaneal fibular ligament, no bony abnormality or deformity, no erythema  No tenderness to palpation of the calf, no unilateral leg swelling, no tenderness to palpation the popliteal, no evidence of DVT, or cellulitis  Patient is able to ambulate with a slight limp  Neurological: She is alert and oriented to person, place, and time.  Skin: Skin is dry.  Skin is clear  Psychiatric: She has a normal mood and affect. Her behavior is normal. Judgment and thought content normal.    ED Course  Procedures (including critical care time) Labs Review Labs Reviewed - No data to display  Imaging Review No results  found.   EKG Interpretation None      MDM   Final diagnoses:  Tendonitis    Patient with suspected Achilles tendinitis. She was recently diagnosed by her orthopedic doctor with the same. She is seeking additional help here today. I told that we can give her a splint. No indication for imaging at this time per [place] ankle rules. No evidence of cellulitis, or DVT. Will discharge to home. Patient has followup appointment with orthopedics on Wednesday.    Montine Circle, PA-C 12/23/13 1900

## 2013-12-23 NOTE — Discharge Instructions (Signed)
Achilles Tendinitis Achilles tendinitis is inflammation of the tough, cord-like band that attaches the lower muscles of your leg to your heel (Achilles tendon). It is usually caused by overusing the tendon and joint involved.  CAUSES Achilles tendinitis can happen because of:  A sudden increase in exercise or activity (such as running).  Doing the same exercises or activities (such as jumping) over and over.  Not warming up calf muscles before exercising.  Exercising in shoes that are worn out or not made for exercise.  Having arthritis or a bone growth on the back of the heel bone. This can rub against the tendon and hurt the tendon. SIGNS AND SYMPTOMS The most common symptoms are:  Pain in the back of the leg, just above the heel. The pain usually gets worse with exercise and better with rest.  Stiffness or soreness in the back of the leg, especially in the morning.  Swelling of the skin over the Achilles tendon.  Trouble standing on tiptoe. Sometimes, an Achilles tendon tears (ruptures). Symptoms of an Achilles tendon rupture can include:  Sudden, severe pain in the back of the leg.  Trouble putting weight on the foot or walking normally. DIAGNOSIS Achilles tendinitis will be diagnosed based on symptoms and a physical examination. An X-ray may be done to check if another condition is causing your symptoms. An MRI may be ordered if your health care provider suspects you may have completely torn your tendon, which is called an Achilles tendon rupture.  TREATMENT  Achilles tendinitis usually gets better over time. It can take weeks to months to heal completely. Treatment focuses on treating the symptoms and helping the injury heal. HOME CARE INSTRUCTIONS   Rest your Achilles tendon and avoid activities that cause pain.  Apply ice to the injured area:  Put ice in a plastic bag.  Place a towel between your skin and the bag.  Leave the ice on for 20 minutes, 2-3 times a  day  Try to avoid using the tendon (other than gentle range of motion) while the tendon is painful. Do not resume use until instructed by your health care provider. Then begin use gradually. Do not increase use to the point of pain. If pain does develop, decrease use and continue the above measures. Gradually increase activities that do not cause discomfort until you achieve normal use.  Do exercises to make your calf muscles stronger and more flexible. Your health care provider or physical therapist can recommend exercises for you to do.  Wrap your ankle with an elastic bandage or other wrap. This can help keep your tendon from moving too much. Your health care provider will show you how to wrap your ankle correctly.  Only take over-the-counter or prescription medicines for pain, discomfort, or fever as directed by your health care provider. SEEK MEDICAL CARE IF:   Your pain and swelling increase or pain is uncontrolled with medicines.  You develop new, unexplained symptoms or your symptoms get worse.  You are unable to move your toes or foot.  You develop warmth and swelling in your foot.  You have an unexplained temperature. MAKE SURE YOU:   Understand these instructions.  Will watch your condition.  Will get help right away if you are not doing well or get worse. Document Released: 03/23/2005 Document Revised: 04/03/2013 Document Reviewed: 01/23/2013 [employer] Patient Information 2015 [employer]. This information is not intended to replace advice given to you by your health care provider. Make sure you discuss   any questions you have with your health care provider.  

## 2013-12-24 NOTE — ED Provider Notes (Signed)
Medical screening examination/treatment/procedure(s) were performed by non-physician practitioner and as supervising physician I was immediately available for consultation/collaboration.   Neta Ehlers, MD 12/24/13 [phone]

## 2013-12-27 ENCOUNTER — Emergency Department (HOSPITAL_COMMUNITY): Payer: Medicare HMO

## 2013-12-27 ENCOUNTER — Encounter (HOSPITAL_COMMUNITY): Payer: Self-pay | Admitting: Emergency Medicine

## 2013-12-27 ENCOUNTER — Inpatient Hospital Stay (HOSPITAL_COMMUNITY)
Admission: EM | Admit: 2013-12-27 | Discharge: 2013-12-28 | DRG: 176 | Disposition: A | Discharge status: Home / Self Care | Payer: Medicare HMO | Attending: Family Medicine | Admitting: Family Medicine

## 2013-12-27 DIAGNOSIS — F411 Generalized anxiety disorder: Secondary | ICD-10-CM | POA: Diagnosis present

## 2013-12-27 DIAGNOSIS — M1710 Unilateral primary osteoarthritis, unspecified knee: Secondary | ICD-10-CM | POA: Diagnosis present

## 2013-12-27 DIAGNOSIS — E669 Obesity, unspecified: Secondary | ICD-10-CM | POA: Diagnosis present

## 2013-12-27 DIAGNOSIS — I2699 Other pulmonary embolism without acute cor pulmonale: Principal | ICD-10-CM | POA: Diagnosis present

## 2013-12-27 DIAGNOSIS — J45909 Unspecified asthma, uncomplicated: Secondary | ICD-10-CM | POA: Diagnosis present

## 2013-12-27 DIAGNOSIS — M130 Polyarthritis, unspecified: Secondary | ICD-10-CM

## 2013-12-27 DIAGNOSIS — Z87891 Personal history of nicotine dependence: Secondary | ICD-10-CM | POA: Diagnosis not present

## 2013-12-27 DIAGNOSIS — F329 Major depressive disorder, single episode, unspecified: Secondary | ICD-10-CM | POA: Diagnosis present

## 2013-12-27 DIAGNOSIS — Z6841 Body Mass Index (BMI) 40.0 and over, adult: Secondary | ICD-10-CM | POA: Diagnosis not present

## 2013-12-27 DIAGNOSIS — E876 Hypokalemia: Secondary | ICD-10-CM | POA: Diagnosis present

## 2013-12-27 DIAGNOSIS — K219 Gastro-esophageal reflux disease without esophagitis: Secondary | ICD-10-CM | POA: Diagnosis present

## 2013-12-27 DIAGNOSIS — R519 Headache, unspecified: Secondary | ICD-10-CM

## 2013-12-27 DIAGNOSIS — Z79899 Other long term (current) drug therapy: Secondary | ICD-10-CM

## 2013-12-27 DIAGNOSIS — I10 Essential (primary) hypertension: Secondary | ICD-10-CM | POA: Diagnosis present

## 2013-12-27 DIAGNOSIS — F3289 Other specified depressive episodes: Secondary | ICD-10-CM | POA: Diagnosis present

## 2013-12-27 DIAGNOSIS — J309 Allergic rhinitis, unspecified: Secondary | ICD-10-CM

## 2013-12-27 DIAGNOSIS — R51 Headache: Secondary | ICD-10-CM

## 2013-12-27 DIAGNOSIS — J383 Other diseases of vocal cords: Secondary | ICD-10-CM

## 2013-12-27 DIAGNOSIS — IMO0002 Reserved for concepts with insufficient information to code with codable children: Secondary | ICD-10-CM

## 2013-12-27 DIAGNOSIS — G2589 Other specified extrapyramidal and movement disorders: Secondary | ICD-10-CM

## 2013-12-27 DIAGNOSIS — R0602 Shortness of breath: Secondary | ICD-10-CM | POA: Diagnosis present

## 2013-12-27 DIAGNOSIS — R600 Localized edema: Secondary | ICD-10-CM

## 2013-12-27 DIAGNOSIS — M7541 Impingement syndrome of right shoulder: Secondary | ICD-10-CM

## 2013-12-27 DIAGNOSIS — N318 Other neuromuscular dysfunction of bladder: Secondary | ICD-10-CM

## 2013-12-27 HISTORY — DX: Low back pain, unspecified: M54.50

## 2013-12-27 HISTORY — DX: Thyrotoxicosis, unspecified without thyrotoxic crisis or storm: E05.90

## 2013-12-27 HISTORY — DX: Other chronic pain: G89.29

## 2013-12-27 HISTORY — DX: Migraine, unspecified, not intractable, without status migrainosus: G43.909

## 2013-12-27 HISTORY — DX: Family history of other specified conditions: Z84.89

## 2013-12-27 HISTORY — DX: Other pulmonary embolism without acute cor pulmonale: I26.99

## 2013-12-27 HISTORY — DX: Low back pain: M54.5

## 2013-12-27 LAB — CBC
HCT: 41.1 % (ref 36.0–46.0)
HEMOGLOBIN: 13 g/dL (ref 12.0–15.0)
MCH: 30 pg (ref 26.0–34.0)
MCHC: 31.6 g/dL (ref 30.0–36.0)
MCV: 94.7 fL (ref 78.0–100.0)
Platelets: 403 10*3/uL — ABNORMAL HIGH (ref 150–400)
RBC: 4.34 MIL/uL (ref 3.87–5.11)
RDW: 15.8 % — ABNORMAL HIGH (ref 11.5–15.5)
WBC: 9.7 10*3/uL (ref 4.0–10.5)

## 2013-12-27 LAB — BASIC METABOLIC PANEL
Anion gap: 14 (ref 5–15)
BUN: 9 mg/dL (ref 6–23)
CHLORIDE: 103 meq/L (ref 96–112)
CO2: 26 meq/L (ref 19–32)
CREATININE: 0.57 mg/dL (ref 0.50–1.10)
Calcium: 9.1 mg/dL (ref 8.4–10.5)
GFR calc Af Amer: >90 mL/min (ref >90)
GFR calc non Af Amer: >90 mL/min (ref >90)
GLUCOSE: 125 mg/dL — AB (ref 70–99)
Potassium: 3.2 mEq/L — ABNORMAL LOW (ref 3.7–5.3)
Sodium: 143 mEq/L (ref 137–147)

## 2013-12-27 LAB — PROTIME-INR
INR: 0.92 (ref 0.00–1.49)
Prothrombin Time: 12.4 seconds (ref 11.6–15.2)

## 2013-12-27 LAB — APTT: APTT: 23 s — AB (ref 24–37)

## 2013-12-27 MED ORDER — IPRATROPIUM BROMIDE 0.02 % IN SOLN
0.5000 mg | Freq: Once | RESPIRATORY_TRACT | Status: DC
  Filled 2013-12-27: qty 2.5

## 2013-12-27 MED ORDER — HEPARIN BOLUS VIA INFUSION
5000.0000 [IU] | Freq: Once | INTRAVENOUS | Status: AC
  Administered 2013-12-27: 5000 [IU] via INTRAVENOUS
  Filled 2013-12-27: qty 5000

## 2013-12-27 MED ORDER — HEPARIN (PORCINE) IN NACL 100-0.45 UNIT/ML-% IJ SOLN
1500.0000 [IU]/h | INTRAMUSCULAR | Status: DC
  Administered 2013-12-27: 1500 [IU]/h via INTRAVENOUS
  Filled 2013-12-27: qty 250

## 2013-12-27 MED ORDER — IOHEXOL 350 MG/ML SOLN
100.0000 mL | Freq: Once | INTRAVENOUS | Status: AC | PRN
  Administered 2013-12-27: 100 mL via INTRAVENOUS

## 2013-12-27 MED ORDER — ALBUTEROL SULFATE (2.5 MG/3ML) 0.083% IN NEBU
5.0000 mg | INHALATION_SOLUTION | Freq: Once | RESPIRATORY_TRACT | Status: DC
  Filled 2013-12-27: qty 6

## 2013-12-27 MED ORDER — SODIUM CHLORIDE 0.9 % IV BOLUS (SEPSIS)
500.0000 mL | Freq: Once | INTRAVENOUS | Status: AC
  Administered 2013-12-27: 500 mL via INTRAVENOUS

## 2013-12-27 NOTE — Progress Notes (Signed)
ANTICOAGULATION CONSULT NOTE - Initial Consult  Pharmacy Consult for Heparin Indication: pulmonary embolus  Allergies  Allergen Reactions  . Caffeine Other (See Comments)    Migraine  . Ciprofloxacin Hives  . Lisinopril Cough  . Vicodin [Hydrocodone-Acetaminophen] Nausea And Vomiting and Other (See Comments)    Headache   . Erythromycin Rash  . Oxycodone Nausea And Vomiting    headache  . Sulfamethoxazole-Trimethoprim Rash    Patient Measurements: Height: 5\' 5"  (165.1 cm) Weight: 305 lb (138.347 kg) IBW/kg (Calculated) : 57 Heparin Dosing Weight: 82 kg  Vital Signs: Temp: 97.7 F (36.5 C) (07/03 1526) Temp src: Oral (07/03 1526) BP: 140/72 mmHg (07/03 2140) Pulse Rate: 137 (07/03 2140)  Labs:  Recent Labs  12/27/13 1620  HGB 13.0  HCT 41.1  PLT 403*  CREATININE 0.57    Estimated Creatinine Clearance: 108.3 ml/min (by C-G formula based on Cr of 0.57).   Medical History: Past Medical History  Diagnosis Date  . Asthma   . Hypertension   . Thyroid disease   . Osteoarthritis   . GERD (gastroesophageal reflux disease)   . Anxiety   . Depression   . Headache(784.0)     Medications:  Scheduled:  . albuterol  5 mg Nebulization Once  . ipratropium  0.5 mg Nebulization Once   Infusions:  . heparin     And  . heparin      Assessment: 58 yo c/o difficulty breathing and chest tightness found to have left upper lobe and right lower lobe PE.  Goal of Therapy:  Heparin level 0.3-0.7 units/ml Monitor platelets by anticoagulation protocol: Yes   Plan:   Baseline coags stat  Heparin 5000 unit bolus x1  Start drip @ 1500 units/hr  Daily CBC/HL  Lawana Pai R 12/27/2013,10:05 PM

## 2013-12-27 NOTE — ED Notes (Addendum)
Per EMS, pt woke up this morning with difficulty breathing and tightness in her chest. Pt has hx of asthma and has had similar symptoms in the past. The pt usually uses her albuterol nebulizer when she has similar symptoms, but she was unable to use it today due to her power being out. Pt was wheezing in all fields upon EMS arrival. EMS gave pt 15 albuterol/1 atrovent, 150mg  solumedrol. Pt in NAD, A&O upon arrival to ED.

## 2013-12-27 NOTE — ED Notes (Signed)
Bed: SH70 Expected date:  Expected time:  Means of arrival:  Comments: EMS- respiratory distress

## 2013-12-27 NOTE — ED Provider Notes (Signed)
CSN: [id number]     Arrival date & time 12/27/13  1508 History   First MD Initiated Contact with Patient 12/27/13 1530     Chief Complaint  Patient presents with  . Respiratory Distress     (Consider location/radiation/quality/duration/timing/severity/associated sxs/prior Treatment) HPI Pt presenting with c/o shortness of breath, chest tightness and wheezing.  She has hx of asthma and felt that her sympotms began this morning due to being outside in the humidity today.  No fever/chills.  Pt states she was having difficulty breathing at home.  Used her albuterol MDI mulitple times today without much relief.  Was going to use albuterol neb machine but the power went out in her home so she called 911.  She was given albuterol and solumedrol by EMS and at this time feels improved.  She states she has not taken steroids recently for her asthma, but has needed to take many times in the past.  No recent hosptialization.  No leg swelling or chest pain.  There are no other associated systemic symptoms, there are no other alleviating or modifying factors.   Past Medical History  Diagnosis Date  . Asthma   . Hypertension   . GERD (gastroesophageal reflux disease)   . Anxiety   . Depression   . Family history of anesthesia complication     "daughter; causes her to pass out afterwards"  . Pulmonary embolism 12/28/2013    "2 clots in each lung"  . Hyperthyroidism   . NWGNFAOZ(308.6)     "monthly" (12/28/2013)  . Migraine     "monthly" (12/28/2013)  . Osteoarthritis     "both knees; back of my neck; right pelvic bone" (12/28/2013)  . Thyroid disease   . Chronic lower back pain    Past Surgical History  Procedure Laterality Date  . Abdominal hysterectomy    . Wisdom tooth extraction    . Thyroidectomy, partial Right 2005  . Nasal sinus surgery  2007  . Appendectomy    . Excisional hemorrhoidectomy    . Dilation and curettage of uterus    . Tubal ligation    . Breast cyst excision Right    Family  History  Problem Relation Age of Onset  . Osteoarthritis Mother   . Asthma Mother   . Heart failure Mother    History  Substance Use Topics  . Smoking status: Former Smoker -- 0.25 packs/day for 20 years    Types: Cigarettes    Quit date: 07/17/1999  . Smokeless tobacco: Never Used  . Alcohol Use: Yes     Comment: "drank some in my 31's"   OB History   Grav Para Term Preterm Abortions TAB SAB Ect Mult Living                 Review of Systems ROS reviewed and all otherwise negative except for mentioned in HPI    Allergies  Caffeine; Ciprofloxacin; Lisinopril; Vicodin; Erythromycin; Oxycodone; and Sulfamethoxazole-trimethoprim  Home Medications   Prior to Admission medications   Medication Sig Start Date End Date Taking? Authorizing Provider  albuterol (PROVENTIL HFA;VENTOLIN HFA) 108 (90 BASE) MCG/ACT inhaler Inhale 2 puffs into the lungs every 6 (six) hours as needed. For shortness of breath   Yes Historical Provider, MD  albuterol (PROVENTIL) (2.5 MG/3ML) 0.083% nebulizer solution Take 3 mLs (2.5 mg total) by nebulization every 6 (six) hours as needed. For shortness of breath 04/07/13  Yes Hilton Sinclair, MD  ARIPiprazole (ABILIFY) 2 MG tablet Take 2 mg  by mouth daily.   Yes Historical Provider, MD  azelastine (OPTIVAR) 0.05 % ophthalmic solution Place 1 drop into both eyes 2 (two) times daily as needed. For dry/irritated eyes    Yes Historical Provider, MD  budesonide-formoterol (SYMBICORT) 160-4.5 MCG/ACT inhaler Inhale 2 puffs into the lungs 2 (two) times daily.     Yes Historical Provider, MD  cetirizine (ZYRTEC) 10 MG tablet Take 1 tablet (10 mg total) by mouth every morning. 04/07/13  Yes Hilton Sinclair, MD  dexlansoprazole (DEXILANT) 60 MG capsule Take 60 mg by mouth daily.   Yes Historical Provider, MD  diclofenac sodium (VOLTAREN) 1 % GEL Apply 1 application topically 2 (two) times daily as needed. On knees for pain/inflamation    Yes Historical Provider,  MD  DULoxetine (CYMBALTA) 60 MG capsule Take 60 mg by mouth at bedtime.    Yes Historical Provider, MD  ibuprofen (ADVIL,MOTRIN) 600 MG tablet Take 1 tablet (600 mg total) by mouth every 6 (six) hours as needed. 12/23/13  Yes Montine Circle, PA-C  metoprolol-hydrochlorothiazide (LOPRESSOR HCT) 50-25 MG per tablet Take 1 tablet by mouth daily.   Yes Historical Provider, MD  Olopatadine HCl 0.6 % SOLN Place 1 puff into the nose at bedtime.    Yes Historical Provider, MD  ranitidine (ZANTAC) 300 MG tablet Take 300 mg by mouth at bedtime.    Yes Historical Provider, MD  traZODone (DESYREL) 50 MG tablet Take 50 mg by mouth at bedtime as needed for sleep.    Yes Historical Provider, MD  verapamil (CALAN) 80 MG tablet Take 1 tablet (80 mg total) by mouth 3 (three) times daily. 04/02/13  Yes Renee A Kuneff, DO  Rivaroxaban (XARELTO) 15 MG TABS tablet Take 1 tablet (15 mg total) by mouth 2 (two) times daily with a meal. 12/28/13 01/17/14  Archie Patten, MD  rivaroxaban (XARELTO) 20 MG TABS tablet Take 1 tablet (20 mg total) by mouth daily with supper. 01/18/14 03/29/14  Archie Patten, MD   BP 120/83  Pulse 70  Temp(Src) 97.7 F (36.5 C) (Oral)  Resp 18  Ht 5\' 5"  (1.651 m)  Wt 311 lb 4.6 oz (141.2 kg)  BMI 51.80 kg/m2  SpO2 97% Vitals reviewed Physical Exam Physical Examination: General appearance - alert, well appearing, and in no distress Mental status - alert, oriented to person, place, and time Eyes - no conjunctival injection, no scleral icterus Mouth - mucous membranes moist, pharynx normal without lesions Chest - clear to auscultation, no wheezes, rales or rhonchi, symmetric air entry, mild tachypnea Heart - tachycardic  rate, regular rhythm, normal S1, S2, no murmurs, rubs, clicks or gallops Abdomen - soft, nontender, nondistended, no masses or organomegaly Extremities - peripheral pulses normal, no pedal edema, no clubbing or cyanosis Skin - normal coloration and turgor, no rashes  ED  Course  Procedures (including critical care time)  9:45 PM due to persistent intermittent tachycardia CT angio chest was obtained which shows bilateral PEs.  Pt informed of results and starting on heparin.  Calling family medicine now for admission.   Labs Review Labs Reviewed  MRSA PCR SCREENING - Abnormal; Notable for the following:    MRSA by PCR POSITIVE (*)    All other components within normal limits  CBC - Abnormal; Notable for the following:    RDW 15.8 (*)    Platelets 403 (*)    All other components within normal limits  BASIC METABOLIC PANEL - Abnormal; Notable for the following:  Potassium 3.2 (*)    Glucose, Bld 125 (*)    All other components within normal limits  APTT - Abnormal; Notable for the following:    aPTT 23 (*)    All other components within normal limits  HEPARIN LEVEL (UNFRACTIONATED) - Abnormal; Notable for the following:    Heparin Unfractionated <0.10 (*)    All other components within normal limits  COMPREHENSIVE METABOLIC PANEL - Abnormal; Notable for the following:    Potassium 3.4 (*)    Glucose, Bld 190 (*)    Creatinine, Ser 0.48 (*)    Calcium 8.3 (*)    Albumin 2.9 (*)    Total Bilirubin <0.2 (*)    All other components within normal limits  PROTIME-INR    Imaging Review Ct Angio Chest Pe W/cm &/or Wo Cm  12/27/2013   CLINICAL DATA:  The shortness of breath and tachycardia  EXAM: CT ANGIOGRAPHY CHEST WITH CONTRAST  TECHNIQUE: Multidetector CT imaging of the chest was performed using the standard protocol during bolus administration of intravenous contrast. Multiplanar CT image reconstructions and MIPs were obtained to evaluate the vascular anatomy.  CONTRAST:  154mL OMNIPAQUE IOHEXOL 350 MG/ML SOLN  COMPARISON:  None.  FINDINGS: THORACIC INLET/BODY WALL:  Prominent but symmetric and likely normal bilateral axillary lymph nodes.  MEDIASTINUM:  Normal heart size. No pericardial effusion. No acute aortic findings. Although sensitivity is  decreased by patient respiratory motion, there is a pulmonary arterial filling defect at a bifurcation point to the left upper lobe, with poor opacification of the anterior segment left upper lobe pulmonary artery. Beginning at the level of the right lower lobe pulmonary artery there is also a linear filling extending into segmental pulmonary arteries. There is no evidence of right heart strain. There is elevation of the right diaphragm without visible cause. Negative esophagus. No adenopathy.  LUNG WINDOWS:  No consolidation.  No effusion.  No suspicious pulmonary nodule.  UPPER ABDOMEN:  1 cm low-density in the left hepatic lobe appears benign.  OSSEOUS:  No acute fracture. No suspicious lytic or blastic lesions. Large T7 and T8 calcified disc herniations or prominent osteophytes causing moderate to advanced spinal canal stenosis.  Critical Value/emergent results were called by telephone at the time of interpretation on 12/27/2013 at 9:36 PM to Dr. Alfonzo Beers , who verbally acknowledged these results.  Review of the MIP images confirms the above findings.  IMPRESSION: Positive for segmental sized pulmonary embolism to the left upper and right lower lobes.   Electronically Signed   By: Jorje Guild M.D.   On: 12/27/2013 21:38     EKG Interpretation   Date/Time:  Friday December 27 2013 16:30:40 EDT Ventricular Rate:  99 PR Interval:  205 QRS Duration: 79 QT Interval:  330 QTC Calculation: 423 R Axis:   55 Text Interpretation:  Sinus tachycardia Supraventricular bigeminy  Prolonged PR interval Since previous tracing rate slower and now sinus  tach Confirmed by Canary Brim  MD, MARTHA [phone]) on 12/27/2013 7:36:16 PM      MDM   Final diagnoses:  Pulmonary embolism  Essential hypertension, benign  Bilateral edema of lower extremity  Obesity, unspecified  Unspecified asthma(493.90)  OBESITY, NOS  DEPRESSIVE DISORDER, NOS  RESTLESS LEGS SYNDROME  HYPERTENSION, BENIGN SYSTEMIC  RHINITIS, ALLERGIC   ASTHMA, PERSISTENT  OVERACTIVE BLADDER  OSTEOARTHRITIS, KNEES, BILATERAL  POLYARTHRITIS  Mixed headache  Rotator cuff impingement syndrome of right shoulder  Vocal cord dysfunction    Pt with hx of asthma presenting with  c/o having an asthma exacerbation.  She states she is feeling better after nebs and solumedrol by EMS.  However pt observed and continues to be tachycardic in the ED.  EKG initially in 150s, repeat in oneteens with sinus tachy and PACs.  Pt given fluid bolus and HR remained elevated.  Pt with no chest pain and shortness of breath continued to be improved without wheezing.  Therefore CT angio chest ordered and showed bilateral PE.  Pt started on heparin and admitted to [facility] to family practice service.   Nursing notes including past medical history and social history reviewed and considered in documentation Prior records reviewed and considered during this visit  Xray images reviewed and interpreted by me as well.      Threasa Beards, MD 12/28/13 [phone]

## 2013-12-28 ENCOUNTER — Encounter (HOSPITAL_COMMUNITY): Payer: Self-pay | Admitting: General Practice

## 2013-12-28 DIAGNOSIS — I2699 Other pulmonary embolism without acute cor pulmonale: Secondary | ICD-10-CM

## 2013-12-28 DIAGNOSIS — E669 Obesity, unspecified: Secondary | ICD-10-CM

## 2013-12-28 DIAGNOSIS — I10 Essential (primary) hypertension: Secondary | ICD-10-CM

## 2013-12-28 DIAGNOSIS — J45909 Unspecified asthma, uncomplicated: Secondary | ICD-10-CM

## 2013-12-28 DIAGNOSIS — R609 Edema, unspecified: Secondary | ICD-10-CM

## 2013-12-28 HISTORY — DX: Other pulmonary embolism without acute cor pulmonale: I26.99

## 2013-12-28 LAB — COMPREHENSIVE METABOLIC PANEL
ALK PHOS: 67 U/L (ref 39–117)
ALT: 14 U/L (ref 0–35)
ANION GAP: 15 (ref 5–15)
AST: 16 U/L (ref 0–37)
Albumin: 2.9 g/dL — ABNORMAL LOW (ref 3.5–5.2)
BUN: 10 mg/dL (ref 6–23)
CHLORIDE: 102 meq/L (ref 96–112)
CO2: 24 mEq/L (ref 19–32)
Calcium: 8.3 mg/dL — ABNORMAL LOW (ref 8.4–10.5)
Creatinine, Ser: 0.48 mg/dL — ABNORMAL LOW (ref 0.50–1.10)
Glucose, Bld: 190 mg/dL — ABNORMAL HIGH (ref 70–99)
POTASSIUM: 3.4 meq/L — AB (ref 3.7–5.3)
Sodium: 141 mEq/L (ref 137–147)
Total Protein: 7.2 g/dL (ref 6.0–8.3)

## 2013-12-28 LAB — MRSA PCR SCREENING: MRSA BY PCR: POSITIVE — AB

## 2013-12-28 LAB — HEPARIN LEVEL (UNFRACTIONATED)

## 2013-12-28 MED ORDER — METOPROLOL SUCCINATE ER 100 MG PO TB24
100.0000 mg | ORAL_TABLET | Freq: Every day | ORAL | Status: DC
  Administered 2013-12-28: 100 mg via ORAL
  Filled 2013-12-28: qty 1

## 2013-12-28 MED ORDER — BENZONATATE 100 MG PO CAPS
200.0000 mg | ORAL_CAPSULE | Freq: Two times a day (BID) | ORAL | Status: DC | PRN
  Administered 2013-12-28: 200 mg via ORAL
  Filled 2013-12-28: qty 2

## 2013-12-28 MED ORDER — HYDROCHLOROTHIAZIDE 25 MG PO TABS
25.0000 mg | ORAL_TABLET | Freq: Every day | ORAL | Status: DC
  Administered 2013-12-28: 25 mg via ORAL
  Filled 2013-12-28: qty 1

## 2013-12-28 MED ORDER — IBUPROFEN 600 MG PO TABS
600.0000 mg | ORAL_TABLET | Freq: Four times a day (QID) | ORAL | Status: DC | PRN
  Administered 2013-12-28: 600 mg via ORAL
  Filled 2013-12-28 (×2): qty 1

## 2013-12-28 MED ORDER — METOPROLOL-HYDROCHLOROTHIAZIDE 50-25 MG PO TABS: 1.0000 | ORAL_TABLET | Freq: Every day | ORAL | Status: DC

## 2013-12-28 MED ORDER — PANTOPRAZOLE SODIUM 40 MG PO TBEC
40.0000 mg | DELAYED_RELEASE_TABLET | Freq: Every day | ORAL | Status: DC
  Administered 2013-12-28: 40 mg via ORAL
  Filled 2013-12-28: qty 1

## 2013-12-28 MED ORDER — RIVAROXABAN 15 MG PO TABS
15.0000 mg | ORAL_TABLET | Freq: Two times a day (BID) | ORAL | Status: DC
  Administered 2013-12-28: 15 mg via ORAL
  Filled 2013-12-28 (×5): qty 1

## 2013-12-28 MED ORDER — AZELASTINE HCL 0.1 % NA SOLN
1.0000 | Freq: Two times a day (BID) | NASAL | Status: DC
  Filled 2013-12-28: qty 30

## 2013-12-28 MED ORDER — TRAZODONE HCL 50 MG PO TABS
50.0000 mg | ORAL_TABLET | Freq: Every evening | ORAL | Status: DC | PRN
  Filled 2013-12-28: qty 1

## 2013-12-28 MED ORDER — BUDESONIDE-FORMOTEROL FUMARATE 160-4.5 MCG/ACT IN AERO
2.0000 | INHALATION_SPRAY | Freq: Two times a day (BID) | RESPIRATORY_TRACT | Status: DC
  Administered 2013-12-28: 2 via RESPIRATORY_TRACT
  Filled 2013-12-28: qty 6

## 2013-12-28 MED ORDER — SODIUM CHLORIDE 0.9 % IJ SOLN
3.0000 mL | Freq: Two times a day (BID) | INTRAMUSCULAR | Status: DC
  Administered 2013-12-28: 3 mL via INTRAVENOUS

## 2013-12-28 MED ORDER — CHLORHEXIDINE GLUCONATE CLOTH 2 % EX PADS
6.0000 | MEDICATED_PAD | Freq: Every day | CUTANEOUS | Status: DC
  Administered 2013-12-28: 6 via TOPICAL

## 2013-12-28 MED ORDER — LORATADINE 10 MG PO TABS
10.0000 mg | ORAL_TABLET | Freq: Every day | ORAL | Status: DC
  Administered 2013-12-28: 10 mg via ORAL
  Filled 2013-12-28: qty 1

## 2013-12-28 MED ORDER — ALBUTEROL SULFATE (2.5 MG/3ML) 0.083% IN NEBU: 2.5000 mg | INHALATION_SOLUTION | Freq: Four times a day (QID) | RESPIRATORY_TRACT | Status: DC | PRN

## 2013-12-28 MED ORDER — RIVAROXABAN 20 MG PO TABS: 20.0000 mg | ORAL_TABLET | Freq: Every day | ORAL | Status: DC

## 2013-12-28 MED ORDER — MUPIROCIN 2 % EX OINT
1.0000 "application " | TOPICAL_OINTMENT | Freq: Two times a day (BID) | CUTANEOUS | Status: DC
  Administered 2013-12-28: 1 via NASAL
  Filled 2013-12-28: qty 22

## 2013-12-28 MED ORDER — SODIUM CHLORIDE 0.9 % IV SOLN: 250.0000 mL | INTRAVENOUS | Status: DC | PRN

## 2013-12-28 MED ORDER — SODIUM CHLORIDE 0.9 % IJ SOLN: 3.0000 mL | INTRAMUSCULAR | Status: DC | PRN

## 2013-12-28 MED ORDER — RIVAROXABAN 15 MG PO TABS: 15.0000 mg | ORAL_TABLET | Freq: Two times a day (BID) | ORAL | Status: DC

## 2013-12-28 MED ORDER — VERAPAMIL HCL 80 MG PO TABS
80.0000 mg | ORAL_TABLET | Freq: Three times a day (TID) | ORAL | Status: DC
  Administered 2013-12-28: 80 mg via ORAL
  Filled 2013-12-28 (×3): qty 1

## 2013-12-28 MED ORDER — DULOXETINE HCL 60 MG PO CPEP
60.0000 mg | ORAL_CAPSULE | Freq: Every day | ORAL | Status: DC
  Administered 2013-12-28: 60 mg via ORAL
  Filled 2013-12-28 (×2): qty 1

## 2013-12-28 MED ORDER — ALBUTEROL SULFATE (2.5 MG/3ML) 0.083% IN NEBU
3.0000 mL | INHALATION_SOLUTION | Freq: Four times a day (QID) | RESPIRATORY_TRACT | Status: DC | PRN
  Administered 2013-12-28: 3 mL via RESPIRATORY_TRACT
  Filled 2013-12-28: qty 3

## 2013-12-28 MED ORDER — SODIUM CHLORIDE 0.9 % IJ SOLN: 3.0000 mL | Freq: Two times a day (BID) | INTRAMUSCULAR | Status: DC

## 2013-12-28 MED ORDER — FAMOTIDINE 20 MG PO TABS
20.0000 mg | ORAL_TABLET | Freq: Every day | ORAL | Status: DC
  Administered 2013-12-28: 20 mg via ORAL
  Filled 2013-12-28: qty 1

## 2013-12-28 MED ORDER — ARIPIPRAZOLE 2 MG PO TABS
2.0000 mg | ORAL_TABLET | Freq: Every day | ORAL | Status: DC
  Administered 2013-12-28: 2 mg via ORAL
  Filled 2013-12-28: qty 1

## 2013-12-28 MED ORDER — OLOPATADINE HCL 0.6 % NA SOLN: 1.0000 | Freq: Every day | NASAL | Status: DC

## 2013-12-28 NOTE — Progress Notes (Signed)
VASCULAR LAB PRELIMINARY  PRELIMINARY  PRELIMINARY  PRELIMINARY  Bilateral lower extremity venous Dopplers completed.    Preliminary report:  There is no obvious evidence of DVT or SVT noted in the bilateral lower extremities.   Zeda Gangwer, RVT 12/28/2013, 11:11 AM

## 2013-12-28 NOTE — H&P (Signed)
[facility] Admission History and Physical Service Pager: [phone]  Patient name: [name] White  Medical record number: [id number] Date of birth: 05/29/1956 Age: 58 y.o. Gender: female  Primary Care Provider: Howard Pouch, DO Consultants: Pharmacy  Code Status: FULL  Chief Complaint: SOB, chest tightness    Assessment and Plan: [name] White is a 58 y.o. female presenting with bilateral PEs . PMH is significant for asthma, HTN, thyroid disease, GERD, osteoarthritis, and anxiety/depression.  Pulmonary Embolism: CTA showed an embolus in the anterior segment of the left upper lobe pulmonary artery and another in the right lower lobe pulmonary artery. Currently no true inciting event noted. Patient did take estrogen replacement in the past and may have been more sedentary than usual secondary to the Achilles tendonitis. Patient may also have cardiac issues contributing, as she does describe paroxysmal nocturnal dyspnea. EKG showed sinus tachycardia with a HR of 99, atrial enlargement and a prolonged PR interval suggestive of 1st degree AV block.  The patient's tachycardia may have been secondary to the PEs, however albuterol and/or anxiety may have also contributed to her HR.  - FHx of PE: Her Son < age 59; consider hypercoag work-up after completes PE treatment - Admit to family medicine inpatient due to persistent tachycardia. -Cardiac telemetry -LE venous dopplers bilaterally -S/p heparin 5000 unit bolus  -Xarelato 15mg  po BID. Patient will require anticoagulation for at least 3 months. -Continue to monitor vitals.  - No echocardiograms noted in our EMR. May consider this as an outpatient.   Asthma: Patient sees Dr. Lamonte Sakai at [facility]. She feels that the symptoms which brought her to the ED are all secondary to asthma, not the PEs.  However, per the EMR, on 11/12/13, Dr. Lamonte Sakai felt that the majority of her symptoms are more likely secondary to an upper airway  etiology than asthma (which may make up a small component of the symptoms) - Continue Symbicort  -Continue albuterol PRN -Tessalon for cough  - Strongly suggest a sleep study as an outpatient as supect OSA/OHS   Hypertension: Patient denies taking Lopressor or verapamil this AM. BP currently 120/83. - Will restart Lopressor and verapamil in the AM- watch for hypotension.   GERD: Asymptomatic -Continue Pepcid   Anxiety/Depression: Stable - Continue Cymbalta and Abilify  Hypokaliemia: K down at 3.2, this may be secondary to albuterol. -Will replete K -Repeat CMET in the AM.  FEN/GI: Regular diet/ NS IV lock  Prophylaxis: Xarelto   Disposition: Admit to [facility] tele. May be discharged on 7/4 pending vital signs/symptoms.  History of Present Illness: [name] White is a 58 y.o. female presenting from [place] with difficulty breathing, chest tightness, and wheezing found to have bilateral pulmonary emobli on CTA.    The symptoms began on the morning of 7/3 and the patient felt that it was secondary to asthma, however her albuterol MDI x 2 failed to provide relief therefore she presented to the [facility] ED.  In route, she received albuterol 15, 1 atrovent, and solumedrol 150mg .  She felt this improved her symptoms completely, however due to persistent tachycarida, a CTA was order and the patient was found to have bilateral PEs. Heparin was started in the OSH ED, however with any physical activity her HR would increase from the 90s at rest to the 130s.    On the floor, the patient has no complaints. She denies chest pain, chest tightness, SOB, palpitations, or diaphoresis.  She does note that for the last  year, that lying flat causes her to asthma. She notes that in the supine position, she begins to have SOB and cough that does not resolve with sitting up- she notes that she requires an albuterol treatment in order to relieve the discomfort.  Her outpatient pulmonologist has suggested  hat she should get a sleep study in the past. Of note, the patient stopped estrogen replacement in April and for the last 3 months she's had an Achilles tendonitis which has prevented her from being as active.  Her son had a PE when he was 29y/o.  She denies any long trips or smoking. Endorses right leg pain secondary to the tendonitis and feels both her LE are swollen, the R>L.     Review Of Systems: Per HPI with the following additions: None Otherwise 12 point review of systems was performed and was unremarkable.  Patient Active Problem List   Diagnosis Date Noted  . Pulmonary embolism 12/27/2013  . Vocal cord dysfunction 11/12/2013  . Lower extremity edema 09/18/2013  . Rotator cuff impingement syndrome of right shoulder 09/18/2013  . Mixed headache 01/06/2012  . Hypothyroidism 11/01/2011  . OVERACTIVE BLADDER 04/18/2008  . OSTEOARTHRITIS, KNEES, BILATERAL 04/18/2008  . POLYARTHRITIS 09/14/2007  . OBESITY, NOS 08/24/2006  . DEPRESSIVE DISORDER, NOS 08/24/2006  . RESTLESS LEGS SYNDROME 08/24/2006  . HYPERTENSION, BENIGN SYSTEMIC 08/24/2006  . RHINITIS, ALLERGIC 08/24/2006  . ASTHMA, PERSISTENT 08/24/2006  . GASTROESOPHAGEAL REFLUX, NO ESOPHAGITIS 08/24/2006   Past Medical History: Past Medical History  Diagnosis Date  . Asthma   . Hypertension   . GERD (gastroesophageal reflux disease)   . Anxiety   . Depression   . Family history of anesthesia complication     "daughter; causes her to pass out afterwards"  . Pulmonary embolism 12/28/2013    "2 clots in each lung"  . Hyperthyroidism   . OEVOJJKK(938.1)     "monthly" (12/28/2013)  . Migraine     "monthly" (12/28/2013)  . Osteoarthritis     "both knees; back of my neck; right pelvic bone" (12/28/2013)  . Thyroid disease   . Chronic lower back pain    Past Surgical History: Past Surgical History  Procedure Laterality Date  . Abdominal hysterectomy    . Wisdom tooth extraction    . Thyroidectomy, partial Right 2005  . Nasal  sinus surgery  2007  . Appendectomy    . Excisional hemorrhoidectomy    . Dilation and curettage of uterus    . Tubal ligation    . Breast cyst excision Right    Social History: History  Substance Use Topics  . Smoking status: Former Smoker -- 0.25 packs/day for 20 years    Types: Cigarettes    Quit date: 07/17/1999  . Smokeless tobacco: Never Used  . Alcohol Use: Yes     Comment: "drank some in my 30's"   Additional social history: Patient has a masters degree in social work currently. Her son passed away at the age of 39 secondary to kidney disease (had HIV) Please also refer to relevant sections of EMR.  Family History: Family History  Problem Relation Age of Onset  . Osteoarthritis Mother   . Asthma Mother   . Heart failure Mother    Allergies and Medications: Allergies  Allergen Reactions  . Caffeine Other (See Comments)    Migraine  . Ciprofloxacin Hives  . Lisinopril Cough  . Vicodin [Hydrocodone-Acetaminophen] Nausea And Vomiting and Other (See Comments)    Headache   . Erythromycin  Rash  . Oxycodone Nausea And Vomiting    headache  . Sulfamethoxazole-Trimethoprim Rash   No current facility-administered medications on file prior to encounter.   Current Outpatient Prescriptions on File Prior to Encounter  Medication Sig Dispense Refill  . albuterol (PROVENTIL HFA;VENTOLIN HFA) 108 (90 BASE) MCG/ACT inhaler Inhale 2 puffs into the lungs every 6 (six) hours as needed. For shortness of breath      . albuterol (PROVENTIL) (2.5 MG/3ML) 0.083% nebulizer solution Take 3 mLs (2.5 mg total) by nebulization every 6 (six) hours as needed. For shortness of breath  75 mL  12  . ARIPiprazole (ABILIFY) 2 MG tablet Take 2 mg by mouth daily.      Marland Kitchen azelastine (OPTIVAR) 0.05 % ophthalmic solution Place 1 drop into both eyes 2 (two) times daily as needed. For dry/irritated eyes       . budesonide-formoterol (SYMBICORT) 160-4.5 MCG/ACT inhaler Inhale 2 puffs into the lungs 2 (two)  times daily.        . cetirizine (ZYRTEC) 10 MG tablet Take 1 tablet (10 mg total) by mouth every morning.  30 tablet  11  . dexlansoprazole (DEXILANT) 60 MG capsule Take 60 mg by mouth daily.      . diclofenac sodium (VOLTAREN) 1 % GEL Apply 1 application topically 2 (two) times daily as needed. On knees for pain/inflamation       . DULoxetine (CYMBALTA) 60 MG capsule Take 60 mg by mouth at bedtime.       Marland Kitchen ibuprofen (ADVIL,MOTRIN) 600 MG tablet Take 1 tablet (600 mg total) by mouth every 6 (six) hours as needed.  30 tablet  0  . Olopatadine HCl 0.6 % SOLN Place 1 puff into the nose at bedtime.       . ranitidine (ZANTAC) 300 MG tablet Take 300 mg by mouth at bedtime.       . traZODone (DESYREL) 50 MG tablet Take 50 mg by mouth at bedtime as needed for sleep.       . verapamil (CALAN) 80 MG tablet Take 1 tablet (80 mg total) by mouth 3 (three) times daily.  90 tablet  0    Objective: BP 120/83  Pulse 86  Temp(Src) 97.7 F (36.5 C) (Oral)  Resp 20  Ht 5\' 5"  (1.651 m)  Wt 311 lb 4.6 oz (141.2 kg)  BMI 51.80 kg/m2  SpO2 97% Exam: General: Obese, pleasant female in NAD lying in bed with 2 pillows.  HEENT: Atraumatic. PERRLA, MMM Cardiovascular: RRR, Distant heart sounds. No murmurs, rubs or gallops noted. No LE edema. Good radial pulses, difficult to palpate DP pulses. Respiratory: LCTAB. No wheezing, crackles, or rhonchi noted. No increased work of breathing. O2 sat 99% on RA Abdomen:  +BS, soft, non-distended, non-tender. No rebound or guarding  Extremities: No edema noted. Pain in the posterior ankle up into the calf with dorsiflexion of the R foot. Pain with palpation in the popliteal fossa on the R. No pain on the L.  Skin: No rashes noted Neuro: A&O. Speech clear and coherent, non-slurred. No focal deficits.   Labs and Imaging: CBC BMET   Recent Labs Lab 12/27/13 1620  WBC 9.7  HGB 13.0  HCT 41.1  PLT 403*    Recent Labs Lab 12/27/13 1620  NA 143  K 3.2*  CL 103   CO2 26  BUN 9  CREATININE 0.57  GLUCOSE 125*  CALCIUM 9.1     Ct Angio Chest Pe W/cm &/or Wo Cm  12/27/2013   CLINICAL DATA:  The shortness of breath and tachycardia  EXAM: CT ANGIOGRAPHY CHEST WITH CONTRAST  TECHNIQUE: Multidetector CT imaging of the chest was performed using the standard protocol during bolus administration of intravenous contrast. Multiplanar CT image reconstructions and MIPs were obtained to evaluate the vascular anatomy.  CONTRAST:  179mL OMNIPAQUE IOHEXOL 350 MG/ML SOLN  COMPARISON:  None.  FINDINGS: THORACIC INLET/BODY WALL:  Prominent but symmetric and likely normal bilateral axillary lymph nodes.  MEDIASTINUM:  Normal heart size. No pericardial effusion. No acute aortic findings. Although sensitivity is decreased by patient respiratory motion, there is a pulmonary arterial filling defect at a bifurcation point to the left upper lobe, with poor opacification of the anterior segment left upper lobe pulmonary artery. Beginning at the level of the right lower lobe pulmonary artery there is also a linear filling extending into segmental pulmonary arteries. There is no evidence of right heart strain. There is elevation of the right diaphragm without visible cause. Negative esophagus. No adenopathy.  LUNG WINDOWS:  No consolidation.  No effusion.  No suspicious pulmonary nodule.  UPPER ABDOMEN:  1 cm low-density in the left hepatic lobe appears benign.  OSSEOUS:  No acute fracture. No suspicious lytic or blastic lesions. Large T7 and T8 calcified disc herniations or prominent osteophytes causing moderate to advanced spinal canal stenosis.  Critical Value/emergent results were called by telephone at the time of interpretation on 12/27/2013 at 9:36 PM to Dr. Alfonzo Beers , who verbally acknowledged these results.  Review of the MIP images confirms the above findings.  IMPRESSION: Positive for segmental sized pulmonary embolism to the left upper and right lower lobes.   Electronically Signed    By: Jorje Guild M.D.   On: 12/27/2013 21:38    Archie Patten, MD 12/28/2013, 12:45 AM PGY-1, [place] Intern pager: [phone], text pages welcome  I have read and agree with the amended note as above. My revisions are noted in [place], MD, PGY-2 5:02 AM

## 2013-12-28 NOTE — H&P (Signed)
Call Pager [phone] for any questions or notifications regarding this patient  FMTS Attending Admission Note: Tarl Cephas MD Attending pager:[phone] I  have seen and examined this patient, reviewed their chart. I have discussed this patient with the resident. I agree with the resident's findings, assessment and care plan. 

## 2013-12-28 NOTE — Care Management Note (Signed)
    Page 1 of 1   12/28/2013     12:09:58 PM CARE MANAGEMENT NOTE 12/28/2013  Patient:  [name] White, [name] White   Account Number:  [id number]  Date Initiated:  12/28/2013  Documentation initiated by:  [facility]  Subjective/Objective Assessment:   adm: SOB, chest tightness     Action/Plan:   med asst   Anticipated DC Date:  12/28/2013   Anticipated DC Plan:  [place]  CM consult  Medication Assistance      Choice offered to / List presented to:             Status of service:  Completed, signed off Medicare Important Message given?   (If response is "NO", the following Medicare IM given date fields will be blank) Date Medicare IM given:   Medicare IM given by:   Date Additional Medicare IM given:   Additional Medicare IM given by:    Discharge Disposition:  HOME/SELF CARE  Per UR Regulation:    If discussed at Long Length of Stay Meetings, dates discussed:    Comments:  12/28/13 11:45 CM activated Xarelto card and ga e it to pt. Pt verbalized she is to take it to the pharmacy with her prescription and the 30 day free trial will give her enough time to have her PCP office staff call insurance and get pre-authorization if necessary.  No other CM needs were communicated.  Mariane Masters, BSN, CM [phone].

## 2013-12-28 NOTE — Discharge Summary (Signed)
[facility] Discharge Summary  Patient name: [name] White Medical record number: [id number] Date of birth: [birth year] Age: 58 y.o. Gender: female Date of Admission: 12/27/2013  Date of Discharge: 12/28/2013 Admitting Physician: [name] La, MD  Primary Care Provider: Howard Pouch, DO Consultants: None  Indication for Hospitalization: Bilateral PEs with persistent tachycardia with minimal activity.  Discharge Diagnoses/Problem List:  Pulmonary emoblisms (bilaterally) Asthma Hypertension  GERD Anxiety Depression Osteoarthritis  Thyroid disease  Disposition: Discharge home  Discharge Condition: Stable  Discharge Exam:  Blood pressure 120/83, pulse 70, temperature 97.7 F (36.5 C), temperature source Oral, resp. rate 18, height 5\' 5"  (1.651 m), weight 311 lb 4.6 oz (141.2 kg), SpO2 97.00%.  General: Obese, pleasant female in NAD lying in bed with 2 pillows.  HEENT: Atraumatic. PERRLA, oropharynx clear, MMM Cardiovascular: RRR, Distant heart sounds. No murmurs, rubs or gallops noted. No LE edema. Good radial pulses, good DP pulse on left, difficult to palpate on the right.  Respiratory: LCTAB. No wheezing, crackles, or rhonchi noted. No increased work of breathing. O2 sat 99% on RA  Abdomen: +BS, soft, non-distended, non-tender. No rebound or guarding  Extremities: No edema noted. Pain in the posterior ankle up into the calf with dorsiflexion of the right foot. Pain with palpation in the popliteal fossa on the right. No pain in the left leg.  Skin: No rashes noted  Neuro: A&O. Speech clear and coherent, non-slurred. No focal deficits.   Brief Hospital Course:  [name] White is a 58 y/o female with a state PMH of asthma, GERD, HTN, and osteoarthritis who was initially evaluated at [facility] for difficulty breathing, chest tightness, and wheezing who was found to have bilateral PEs. Due to persistent tachycardia she was transferred to [facility].  Patient had no complaints besides right Achilles tendonitis on her admission here. HR was in the 70s-80s.  Risk factors for PE are her exposure to estrogen replacement (discontinued in April) and injury to the right Achilles which may have caused her to be more sedentary. Bilateral lower extremity venous dopplers were obtained. Given occasional tachycardia with movement, vital signs were obtain after walking which showed HR in the 90s.   Patient received a heparin bolus of 5000units in [place]. She was then transitioned to Xarelto 15mg  BID. Patient will require Xarelto 15mg  twice daily x 21 days then Xarelto 20mg  daily after that for a total of at least 3 months.   Issues for Follow Up:  -Given patient's son had a PE at the age of 33, a hypercoag work may be warranted after treatment is completed  -Strongly consider a sleep study (as some of the symptoms the patient attributes to asthma seem more consistent with OSA). -Blood glucose ranged from 125-190 with no hemoglobin A1c noted in the EMR. May consider this in the future.    Significant Procedures: None  Significant Labs and Imaging:   Recent Labs Lab 12/27/13 1620  WBC 9.7  HGB 13.0  HCT 41.1  PLT 403*    Recent Labs Lab 12/27/13 1620 12/28/13 0355  NA 143 141  K 3.2* 3.4*  CL 103 102  CO2 26 24  GLUCOSE 125* 190*  BUN 9 10  CREATININE 0.57 0.48*  CALCIUM 9.1 8.3*  ALKPHOS  --  67  AST  --  16  ALT  --  14  ALBUMIN  --  2.9*   CTA   There is a pulmonary arterial filling defect at a bifurcation point  to the left upper lobe, with poor opacification of the anterior segment left upper lobe pulmonary artery. Beginning at the level of the right lower lobe pulmonary artery there is also a linear filling extending into segmental pulmonary arteries. IMPRESSION: Positive for segmental sized pulmonary embolism to the left upper and right lower lobes.   Results/Tests Pending at Time of Discharge: None  Discharge Medications:     Medication List         albuterol 108 (90 BASE) MCG/ACT inhaler  Commonly known as:  PROVENTIL HFA;VENTOLIN HFA  Inhale 2 puffs into the lungs every 6 (six) hours as needed. For shortness of breath     albuterol (2.5 MG/3ML) 0.083% nebulizer solution  Commonly known as:  PROVENTIL  Take 3 mLs (2.5 mg total) by nebulization every 6 (six) hours as needed. For shortness of breath     ARIPiprazole 2 MG tablet  Commonly known as:  ABILIFY  Take 2 mg by mouth daily.     azelastine 0.05 % ophthalmic solution  Commonly known as:  OPTIVAR  Place 1 drop into both eyes 2 (two) times daily as needed. For dry/irritated eyes     budesonide-formoterol 160-4.5 MCG/ACT inhaler  Commonly known as:  SYMBICORT  Inhale 2 puffs into the lungs 2 (two) times daily.     cetirizine 10 MG tablet  Commonly known as:  ZYRTEC  Take 1 tablet (10 mg total) by mouth every morning.     DEXILANT 60 MG capsule  Generic drug:  dexlansoprazole  Take 60 mg by mouth daily.     diclofenac sodium 1 % Gel  Commonly known as:  VOLTAREN  Apply 1 application topically 2 (two) times daily as needed. On knees for pain/inflamation     DULoxetine 60 MG capsule  Commonly known as:  CYMBALTA  Take 60 mg by mouth at bedtime.     ibuprofen 600 MG tablet  Commonly known as:  ADVIL,MOTRIN  Take 1 tablet (600 mg total) by mouth every 6 (six) hours as needed.     metoprolol-hydrochlorothiazide 50-25 MG per tablet  Commonly known as:  LOPRESSOR HCT  Take 1 tablet by mouth daily.     Olopatadine HCl 0.6 % Soln  Place 1 puff into the nose at bedtime.     ranitidine 300 MG tablet  Commonly known as:  ZANTAC  Take 300 mg by mouth at bedtime.     Rivaroxaban 15 MG Tabs tablet  Commonly known as:  XARELTO  Take 1 tablet (15 mg total) by mouth 2 (two) times daily with a meal.     rivaroxaban 20 MG Tabs tablet  Commonly known as:  XARELTO  Take 1 tablet (20 mg total) by mouth daily with supper.  Start taking on:   01/18/2014     traZODone 50 MG tablet  Commonly known as:  DESYREL  Take 50 mg by mouth at bedtime as needed for sleep.     verapamil 80 MG tablet  Commonly known as:  CALAN  Take 1 tablet (80 mg total) by mouth 3 (three) times daily.        Discharge Instructions: Please refer to Patient Instructions section of EMR for full details.  Patient was counseled important signs and symptoms that should prompt return to medical care, changes in medications, dietary instructions, activity restrictions, and follow up appointments.   Follow-Up Appointments: Follow-up Information   Call Fessenden, Joseph Art, DO.   Specialty:  Family Medicine   Contact information:  [place] [address] [phone]       Archie Patten, MD 12/28/2013, 4:45 PM PGY-1, [place]  I have read, updated and agree with the above documentation. Discharged this afternoon after Dr. Lorenso Courier had left.   Laroy Apple, MD [place] Resident, PGY-3 12/28/2013, 4:46 PM

## 2013-12-29 NOTE — Discharge Summary (Signed)
Family Medicine Teaching Service  Discharge Note : Attending Dorcas Mcmurray MD Pager [phone] Office [phone] I have seen and examined this patient, reviewed their chart and discussed discharge planning with the resident at the time of discharge. I agree with the discharge plan as above.

## 2014-01-02 ENCOUNTER — Telehealth: Payer: Self-pay | Admitting: Emergency Medicine

## 2014-01-02 ENCOUNTER — Ambulatory Visit: Payer: Medicare HMO | Admitting: Emergency Medicine

## 2014-01-02 ENCOUNTER — Telehealth: Payer: Self-pay | Admitting: Family Medicine

## 2014-01-02 NOTE — Telephone Encounter (Signed)
Was by Dr. Lorenso Courier in the hospital and prescribed Xarelto. She would like to try something different due to all the side effects and the TV commercials. Please advise.

## 2014-01-02 NOTE — Telephone Encounter (Signed)
Please explain there are side effects with any medications. Xarelto is a rather safe drug. There are similar drugs that she can be changed to, but she will need to come in to discuss them as there are different options and education that needs be completed with each.  Until then she needs to continue the Xarelto to prevent addition blood clots. Thanks .

## 2014-01-03 NOTE — Telephone Encounter (Signed)
lmomtcb x1 

## 2014-01-03 NOTE — Telephone Encounter (Signed)
Relayed message,patient still wanting to change RX.I advised her to schedule appointment since she's still wanting to change.she voiced understanding.Sukhman Kocher, Lewie Loron

## 2014-01-06 NOTE — Telephone Encounter (Signed)
LMTCBx2 on home and cell #. Jennifer Castillo, CMA  

## 2014-01-07 NOTE — Telephone Encounter (Signed)
[email or web address] on home an cell #. Caney Bing, CMA

## 2014-01-08 NOTE — Telephone Encounter (Signed)
Pt returned call

## 2014-01-08 NOTE — Telephone Encounter (Signed)
lmomtcb x1 

## 2014-01-08 NOTE — Telephone Encounter (Signed)
Both numbers went straight to VM. lmtcb

## 2014-01-09 ENCOUNTER — Ambulatory Visit: Payer: Medicare HMO | Admitting: Family Medicine

## 2014-01-09 NOTE — Telephone Encounter (Signed)
LMTCBx2. Sahithi Ordoyne, CMA  

## 2014-01-13 ENCOUNTER — Encounter: Payer: Self-pay | Admitting: *Deleted

## 2014-01-13 NOTE — Telephone Encounter (Signed)
After multiple attempts with every # in chart we have not been able to reach the pt. I will send her a letter asking her to contact our office if anything further is needed. Brier Bing, CMA

## 2014-01-15 LAB — PULMONARY FUNCTION TEST
DL/VA % PRED: 80 %
DL/VA: 3.94 ml/min/mmHg/L
DLCO UNC % PRED: 83 %
DLCO UNC: 21.45 ml/min/mmHg
FEF 25-75 POST: 2.8 L/s
FEF 25-75 PRE: 2.21 L/s
FEF2575-%Change-Post: 26 %
FEF2575-%PRED-PRE: 100 %
FEF2575-%Pred-Post: 127 %
FEV1-%Change-Post: 4 %
FEV1-%Pred-Post: 123 %
FEV1-%Pred-Pre: 118 %
FEV1-Post: 2.74 L
FEV1-Pre: 2.61 L
FEV1FVC-%Change-Post: 6 %
FEV1FVC-%Pred-Pre: 97 %
FEV6-%CHANGE-POST: -1 %
FEV6-%PRED-PRE: 123 %
FEV6-%Pred-Post: 121 %
FEV6-POST: 3.3 L
FEV6-PRE: 3.35 L
FEV6FVC-%PRED-POST: 103 %
FEV6FVC-%Pred-Pre: 103 %
FVC-%Change-Post: -1 %
FVC-%Pred-Post: 117 %
FVC-%Pred-Pre: 119 %
FVC-PRE: 3.35 L
FVC-Post: 3.3 L
POST FEV6/FVC RATIO: 100 %
Post FEV1/FVC ratio: 83 %
Pre FEV1/FVC ratio: 78 %
Pre FEV6/FVC Ratio: 100 %
RV % pred: 101 %
RV: 2.04 L
TLC % pred: 107 %
TLC: 5.59 L

## 2014-01-21 ENCOUNTER — Inpatient Hospital Stay: Payer: Medicare HMO | Admitting: Family Medicine

## 2014-01-23 ENCOUNTER — Inpatient Hospital Stay: Payer: Medicare HMO | Admitting: Adult Health

## 2014-01-27 ENCOUNTER — Inpatient Hospital Stay: Payer: Medicare HMO | Admitting: Adult Health

## 2014-01-27 ENCOUNTER — Inpatient Hospital Stay: Payer: Medicare HMO | Admitting: Family Medicine

## 2014-01-28 NOTE — ED Provider Notes (Signed)
Medical screening examination/treatment/procedure(s) were performed by a resident physician or non-physician practitioner and as the supervising physician I was immediately available for consultation/collaboration.  Lynne Leader, MD    Gregor Hams, MD 01/28/14 [phone]

## 2014-01-29 ENCOUNTER — Emergency Department (INDEPENDENT_AMBULATORY_CARE_PROVIDER_SITE_OTHER)
Admission: EM | Admit: 2014-01-29 | Discharge: 2014-01-29 | Disposition: A | Discharge status: Home / Self Care | Payer: Medicare HMO | Source: Home / Self Care | Attending: Emergency Medicine | Admitting: Emergency Medicine

## 2014-01-29 ENCOUNTER — Encounter (HOSPITAL_COMMUNITY): Payer: Self-pay | Admitting: Emergency Medicine

## 2014-01-29 DIAGNOSIS — J018 Other acute sinusitis: Secondary | ICD-10-CM

## 2014-01-29 DIAGNOSIS — J069 Acute upper respiratory infection, unspecified: Secondary | ICD-10-CM | POA: Diagnosis not present

## 2014-01-29 LAB — POCT RAPID STREP A: Streptococcus, Group A Screen (Direct): NEGATIVE

## 2014-01-29 MED ORDER — AMOXICILLIN-POT CLAVULANATE 875-125 MG PO TABS: 1.0000 | ORAL_TABLET | Freq: Two times a day (BID) | ORAL | Status: DC

## 2014-01-29 NOTE — ED Notes (Signed)
C/o  Sore throat.   Neck swelling.  Bilateral ear pain.  Chest congestion.  Symptoms present since Sunday.   No relief with otc meds.  Denies fever, n/v/d

## 2014-01-29 NOTE — ED Provider Notes (Signed)
CSN: [id number]     Arrival date & time 01/29/14  1612 History   First MD Initiated Contact with Patient 01/29/14 1628     Chief Complaint  Patient presents with  . Sore Throat  . URI    chest congestion   (Consider location/radiation/quality/duration/timing/severity/associated sxs/prior Treatment) HPI She is here today for evaluation of sore throat and chest congestion. She states she woke up with a sore throat, nasal congestion, right ear pain, chest congestion Monday morning. Symptoms have worsened slightly since then. She reports subjective fevers at home on Monday and Tuesday. She denies any cough. She reports postnasal drip. She has asthma, and has been using her albuterol more frequently. She denies wheezing or shortness of breath at this time. No nausea, vomiting, diarrhea. She is tolerating liquids well.  Past Medical History  Diagnosis Date  . Asthma   . Hypertension   . GERD (gastroesophageal reflux disease)   . Anxiety   . Depression   . Family history of anesthesia complication     "daughter; causes her to pass out afterwards"  . Pulmonary embolism 12/28/2013    "2 clots in each lung"  . Hyperthyroidism   . UVOZDGUY(403.4)     "monthly" (12/28/2013)  . Migraine     "monthly" (12/28/2013)  . Osteoarthritis     "both knees; back of my neck; right pelvic bone" (12/28/2013)  . Thyroid disease   . Chronic lower back pain    Past Surgical History  Procedure Laterality Date  . Abdominal hysterectomy    . Wisdom tooth extraction    . Thyroidectomy, partial Right 2005  . Nasal sinus surgery  2007  . Appendectomy    . Excisional hemorrhoidectomy    . Dilation and curettage of uterus    . Tubal ligation    . Breast cyst excision Right    Family History  Problem Relation Age of Onset  . Osteoarthritis Mother   . Asthma Mother   . Heart failure Mother    History  Substance Use Topics  . Smoking status: Former Smoker -- 0.25 packs/day for 20 years    Types: Cigarettes   Quit date: 07/17/1999  . Smokeless tobacco: Never Used  . Alcohol Use: Yes     Comment: "drank some in my 55's"   OB History   Grav Para Term Preterm Abortions TAB SAB Ect Mult Living                 Review of Systems  Constitutional: Positive for fever (subjective). Negative for chills.  HENT: Positive for congestion, ear pain, postnasal drip, rhinorrhea, sore throat and voice change. Negative for trouble swallowing.   Eyes: Negative.   Respiratory: Negative.   Cardiovascular: Negative.   Gastrointestinal: Negative.   Musculoskeletal: Negative.   Skin: Negative for rash.  Neurological: Negative.     Allergies  Caffeine; Ciprofloxacin; Lisinopril; Vicodin; Erythromycin; Oxycodone; and Sulfamethoxazole-trimethoprim  Home Medications   Prior to Admission medications   Medication Sig Start Date End Date Taking? Authorizing Provider  albuterol (PROVENTIL HFA;VENTOLIN HFA) 108 (90 BASE) MCG/ACT inhaler Inhale 2 puffs into the lungs every 6 (six) hours as needed. For shortness of breath   Yes Historical Provider, MD  albuterol (PROVENTIL) (2.5 MG/3ML) 0.083% nebulizer solution Take 3 mLs (2.5 mg total) by nebulization every 6 (six) hours as needed. For shortness of breath 04/07/13  Yes Hilton Sinclair, MD  ARIPiprazole (ABILIFY) 2 MG tablet Take 2 mg by mouth daily.   Yes Historical  Provider, MD  azelastine (OPTIVAR) 0.05 % ophthalmic solution Place 1 drop into both eyes 2 (two) times daily as needed. For dry/irritated eyes    Yes Historical Provider, MD  budesonide-formoterol (SYMBICORT) 160-4.5 MCG/ACT inhaler Inhale 2 puffs into the lungs 2 (two) times daily.     Yes Historical Provider, MD  cetirizine (ZYRTEC) 10 MG tablet Take 1 tablet (10 mg total) by mouth every morning. 04/07/13  Yes Hilton Sinclair, MD  dexlansoprazole (DEXILANT) 60 MG capsule Take 60 mg by mouth daily.   Yes Historical Provider, MD  diclofenac sodium (VOLTAREN) 1 % GEL Apply 1 application topically 2  (two) times daily as needed. On knees for pain/inflamation    Yes Historical Provider, MD  DULoxetine (CYMBALTA) 60 MG capsule Take 60 mg by mouth at bedtime.    Yes Historical Provider, MD  ibuprofen (ADVIL,MOTRIN) 600 MG tablet Take 1 tablet (600 mg total) by mouth every 6 (six) hours as needed. 12/23/13  Yes Montine Circle, PA-C  metoprolol-hydrochlorothiazide (LOPRESSOR HCT) 50-25 MG per tablet Take 1 tablet by mouth daily.   Yes Historical Provider, MD  Olopatadine HCl 0.6 % SOLN Place 1 puff into the nose at bedtime.    Yes Historical Provider, MD  ranitidine (ZANTAC) 300 MG tablet Take 300 mg by mouth at bedtime.    Yes Historical Provider, MD  rivaroxaban (XARELTO) 20 MG TABS tablet Take 1 tablet (20 mg total) by mouth daily with supper. 01/18/14 03/29/14 Yes Archie Patten, MD  traZODone (DESYREL) 50 MG tablet Take 50 mg by mouth at bedtime as needed for sleep.    Yes Historical Provider, MD  verapamil (CALAN) 80 MG tablet Take 1 tablet (80 mg total) by mouth 3 (three) times daily. 04/02/13  Yes Renee A Kuneff, DO  amoxicillin-clavulanate (AUGMENTIN) 875-125 MG per tablet Take 1 tablet by mouth 2 (two) times daily. 01/29/14   Melony Overly, MD  Rivaroxaban (XARELTO) 15 MG TABS tablet Take 1 tablet (15 mg total) by mouth 2 (two) times daily with a meal. 12/28/13 01/17/14  Archie Patten, MD   BP 123/75  Pulse 115  Temp(Src) 98.8 F (37.1 C) (Oral)  Resp 12  SpO2 100% Physical Exam  Constitutional: She is oriented to person, place, and time. She appears well-developed and well-nourished. No distress.  HENT:  Head: Normocephalic and atraumatic.  Right Ear: Tympanic membrane and external ear normal.  Left Ear: Tympanic membrane and external ear normal.  Nose: Mucosal edema and rhinorrhea (purulent) present. Right sinus exhibits maxillary sinus tenderness. Right sinus exhibits no frontal sinus tenderness. Left sinus exhibits maxillary sinus tenderness. Left sinus exhibits no frontal sinus  tenderness.  Mouth/Throat: Uvula is midline. Mucous membranes are not dry. Posterior oropharyngeal erythema present. No oropharyngeal exudate.  Eyes: Conjunctivae are normal. Right eye exhibits no discharge. Left eye exhibits no discharge.  Neck: Normal range of motion. Neck supple.  Cardiovascular: Regular rhythm and normal heart sounds.  Tachycardia present.   No murmur heard. Pulmonary/Chest: Effort normal and breath sounds normal. No respiratory distress. She has no wheezes. She has no rales.  Lymphadenopathy:    She has no cervical adenopathy.  Neurological: She is alert and oriented to person, place, and time.  Skin: Skin is warm and dry. No rash noted.    ED Course  Procedures (including critical care time) Labs Review Labs Reviewed  POCT RAPID STREP A ([facility] URG CARE ONLY)    Imaging Review No results found.   MDM  1. Viral URI   2. Other acute sinusitis    Rapid strep is negative. Exam is consistent with developing bacterial sinusitis. Discussed symptomatic treatment for URI symptoms, including throat lozenges, Chloraseptic spray, Mucinex and nasal saline spray. Will treat sinusitis with Augmentin twice a day x10 days. Followup with PCP if not improving by Monday.    Melony Overly, MD 01/29/14 [phone]

## 2014-01-29 NOTE — Discharge Instructions (Signed)
You have a virus with a sinus infection on top of it. Take the Augmentin 1 pill twice a day for 10 days. I would recommended getting Mucinex and nasal saline spray at the pharmacy and take as directed on packaging. You may need to use your inhaler every 4 hours for the next few days.  Follow up with your regular doctor on Monday if you are not feeling better.

## 2014-01-31 ENCOUNTER — Inpatient Hospital Stay (HOSPITAL_COMMUNITY): Payer: Medicare HMO

## 2014-01-31 ENCOUNTER — Inpatient Hospital Stay (HOSPITAL_COMMUNITY)
Admission: EM | Admit: 2014-01-31 | Discharge: 2014-02-04 | DRG: 308 | Disposition: A | Discharge status: Home / Self Care | Payer: Medicare HMO | Attending: Family Medicine | Admitting: Family Medicine

## 2014-01-31 ENCOUNTER — Encounter (HOSPITAL_COMMUNITY): Payer: Self-pay | Admitting: Emergency Medicine

## 2014-01-31 ENCOUNTER — Emergency Department (HOSPITAL_COMMUNITY): Payer: Medicare HMO

## 2014-01-31 DIAGNOSIS — E860 Dehydration: Secondary | ICD-10-CM | POA: Diagnosis present

## 2014-01-31 DIAGNOSIS — Z87891 Personal history of nicotine dependence: Secondary | ICD-10-CM | POA: Diagnosis not present

## 2014-01-31 DIAGNOSIS — Z6841 Body Mass Index (BMI) 40.0 and over, adult: Secondary | ICD-10-CM

## 2014-01-31 DIAGNOSIS — J36 Peritonsillar abscess: Secondary | ICD-10-CM | POA: Diagnosis not present

## 2014-01-31 DIAGNOSIS — I471 Supraventricular tachycardia, unspecified: Principal | ICD-10-CM

## 2014-01-31 DIAGNOSIS — M1710 Unilateral primary osteoarthritis, unspecified knee: Secondary | ICD-10-CM | POA: Diagnosis present

## 2014-01-31 DIAGNOSIS — I10 Essential (primary) hypertension: Secondary | ICD-10-CM | POA: Diagnosis present

## 2014-01-31 DIAGNOSIS — I2699 Other pulmonary embolism without acute cor pulmonale: Secondary | ICD-10-CM | POA: Diagnosis present

## 2014-01-31 DIAGNOSIS — Z7901 Long term (current) use of anticoagulants: Secondary | ICD-10-CM

## 2014-01-31 DIAGNOSIS — Z79899 Other long term (current) drug therapy: Secondary | ICD-10-CM

## 2014-01-31 DIAGNOSIS — F329 Major depressive disorder, single episode, unspecified: Secondary | ICD-10-CM | POA: Diagnosis present

## 2014-01-31 DIAGNOSIS — G4733 Obstructive sleep apnea (adult) (pediatric): Secondary | ICD-10-CM | POA: Diagnosis present

## 2014-01-31 DIAGNOSIS — M545 Low back pain, unspecified: Secondary | ICD-10-CM | POA: Diagnosis present

## 2014-01-31 DIAGNOSIS — I498 Other specified cardiac arrhythmias: Secondary | ICD-10-CM | POA: Diagnosis present

## 2014-01-31 DIAGNOSIS — E039 Hypothyroidism, unspecified: Secondary | ICD-10-CM | POA: Diagnosis present

## 2014-01-31 DIAGNOSIS — Z825 Family history of asthma and other chronic lower respiratory diseases: Secondary | ICD-10-CM | POA: Diagnosis not present

## 2014-01-31 DIAGNOSIS — J449 Chronic obstructive pulmonary disease, unspecified: Secondary | ICD-10-CM | POA: Diagnosis present

## 2014-01-31 DIAGNOSIS — J029 Acute pharyngitis, unspecified: Secondary | ICD-10-CM

## 2014-01-31 DIAGNOSIS — F3289 Other specified depressive episodes: Secondary | ICD-10-CM | POA: Diagnosis present

## 2014-01-31 DIAGNOSIS — J391 Other abscess of pharynx: Secondary | ICD-10-CM | POA: Diagnosis present

## 2014-01-31 DIAGNOSIS — J45901 Unspecified asthma with (acute) exacerbation: Secondary | ICD-10-CM

## 2014-01-31 DIAGNOSIS — I2782 Chronic pulmonary embolism: Secondary | ICD-10-CM | POA: Diagnosis present

## 2014-01-31 DIAGNOSIS — I509 Heart failure, unspecified: Secondary | ICD-10-CM | POA: Diagnosis present

## 2014-01-31 DIAGNOSIS — J309 Allergic rhinitis, unspecified: Secondary | ICD-10-CM | POA: Diagnosis present

## 2014-01-31 DIAGNOSIS — T45515A Adverse effect of anticoagulants, initial encounter: Secondary | ICD-10-CM | POA: Diagnosis present

## 2014-01-31 DIAGNOSIS — E662 Morbid (severe) obesity with alveolar hypoventilation: Secondary | ICD-10-CM | POA: Diagnosis present

## 2014-01-31 DIAGNOSIS — Z8249 Family history of ischemic heart disease and other diseases of the circulatory system: Secondary | ICD-10-CM

## 2014-01-31 DIAGNOSIS — R0609 Other forms of dyspnea: Secondary | ICD-10-CM

## 2014-01-31 DIAGNOSIS — T502X5A Adverse effect of carbonic-anhydrase inhibitors, benzothiadiazides and other diuretics, initial encounter: Secondary | ICD-10-CM | POA: Diagnosis present

## 2014-01-31 DIAGNOSIS — E876 Hypokalemia: Secondary | ICD-10-CM | POA: Diagnosis present

## 2014-01-31 DIAGNOSIS — G2581 Restless legs syndrome: Secondary | ICD-10-CM | POA: Diagnosis present

## 2014-01-31 DIAGNOSIS — I4891 Unspecified atrial fibrillation: Secondary | ICD-10-CM | POA: Diagnosis present

## 2014-01-31 DIAGNOSIS — E669 Obesity, unspecified: Secondary | ICD-10-CM

## 2014-01-31 DIAGNOSIS — G8929 Other chronic pain: Secondary | ICD-10-CM | POA: Diagnosis present

## 2014-01-31 DIAGNOSIS — R0989 Other specified symptoms and signs involving the circulatory and respiratory systems: Secondary | ICD-10-CM

## 2014-01-31 DIAGNOSIS — I4892 Unspecified atrial flutter: Secondary | ICD-10-CM

## 2014-01-31 DIAGNOSIS — R05 Cough: Secondary | ICD-10-CM

## 2014-01-31 DIAGNOSIS — I4719 Other supraventricular tachycardia: Secondary | ICD-10-CM

## 2014-01-31 DIAGNOSIS — J45909 Unspecified asthma, uncomplicated: Secondary | ICD-10-CM | POA: Diagnosis present

## 2014-01-31 DIAGNOSIS — R059 Cough, unspecified: Secondary | ICD-10-CM

## 2014-01-31 DIAGNOSIS — K219 Gastro-esophageal reflux disease without esophagitis: Secondary | ICD-10-CM | POA: Diagnosis present

## 2014-01-31 DIAGNOSIS — J4489 Other specified chronic obstructive pulmonary disease: Secondary | ICD-10-CM | POA: Diagnosis present

## 2014-01-31 DIAGNOSIS — F32A Depression, unspecified: Secondary | ICD-10-CM | POA: Diagnosis present

## 2014-01-31 LAB — BASIC METABOLIC PANEL
Anion gap: 16 — ABNORMAL HIGH (ref 5–15)
BUN: 6 mg/dL (ref 6–23)
CO2: 22 mEq/L (ref 19–32)
Calcium: 9.1 mg/dL (ref 8.4–10.5)
Chloride: 100 mEq/L (ref 96–112)
Creatinine, Ser: 0.47 mg/dL — ABNORMAL LOW (ref 0.50–1.10)
GFR calc Af Amer: >90 mL/min (ref >90)
Glucose, Bld: 152 mg/dL — ABNORMAL HIGH (ref 70–99)
Potassium: 3.9 mEq/L (ref 3.7–5.3)
Sodium: 138 mEq/L (ref 137–147)

## 2014-01-31 LAB — COMPREHENSIVE METABOLIC PANEL
ALT: 14 U/L (ref 0–35)
AST: 16 U/L (ref 0–37)
Albumin: 3.2 g/dL — ABNORMAL LOW (ref 3.5–5.2)
Alkaline Phosphatase: 93 U/L (ref 39–117)
Anion gap: 16 — ABNORMAL HIGH (ref 5–15)
BUN: 8 mg/dL (ref 6–23)
CALCIUM: 9 mg/dL (ref 8.4–10.5)
CO2: 27 meq/L (ref 19–32)
CREATININE: 0.6 mg/dL (ref 0.50–1.10)
Chloride: 96 mEq/L (ref 96–112)
GLUCOSE: 126 mg/dL — AB (ref 70–99)
Potassium: 2.8 mEq/L — CL (ref 3.7–5.3)
Sodium: 139 mEq/L (ref 137–147)
Total Bilirubin: 0.6 mg/dL (ref 0.3–1.2)
Total Protein: 8.1 g/dL (ref 6.0–8.3)

## 2014-01-31 LAB — CBC
HEMATOCRIT: 39.2 % (ref 36.0–46.0)
Hemoglobin: 12.7 g/dL (ref 12.0–15.0)
MCH: 29.8 pg (ref 26.0–34.0)
MCHC: 32.4 g/dL (ref 30.0–36.0)
MCV: 92 fL (ref 78.0–100.0)
PLATELETS: 429 10*3/uL — AB (ref 150–400)
RBC: 4.26 MIL/uL (ref 3.87–5.11)
RDW: 15.9 % — ABNORMAL HIGH (ref 11.5–15.5)
WBC: 17.9 10*3/uL — ABNORMAL HIGH (ref 4.0–10.5)

## 2014-01-31 LAB — PRO B NATRIURETIC PEPTIDE: PRO B NATRI PEPTIDE: 1316 pg/mL — AB (ref 0–125)

## 2014-01-31 LAB — URINE MICROSCOPIC-ADD ON

## 2014-01-31 LAB — TROPONIN I
Troponin I: <0.3 ng/mL (ref <0.30)
Troponin I: <0.3 ng/mL (ref <0.30)

## 2014-01-31 LAB — URINALYSIS, ROUTINE W REFLEX MICROSCOPIC
BILIRUBIN URINE: NEGATIVE
Glucose, UA: NEGATIVE mg/dL
KETONES UR: 15 mg/dL — AB
NITRITE: NEGATIVE
Protein, ur: NEGATIVE mg/dL
Specific Gravity, Urine: 1.008 (ref 1.005–1.030)
UROBILINOGEN UA: 1 mg/dL (ref 0.0–1.0)
pH: 6.5 (ref 5.0–8.0)

## 2014-01-31 LAB — CULTURE, GROUP A STREP

## 2014-01-31 LAB — MAGNESIUM: Magnesium: 1.7 mg/dL (ref 1.5–2.5)

## 2014-01-31 LAB — MRSA PCR SCREENING: MRSA by PCR: NEGATIVE

## 2014-01-31 LAB — LACTIC ACID, PLASMA: Lactic Acid, Venous: 1.2 mmol/L (ref 0.5–2.2)

## 2014-01-31 LAB — RAPID STREP SCREEN (MED CTR MEBANE ONLY): Streptococcus, Group A Screen (Direct): NEGATIVE

## 2014-01-31 MED ORDER — MENTHOL 3 MG MT LOZG
1.0000 | LOZENGE | OROMUCOSAL | Status: DC | PRN
  Administered 2014-02-01 – 2014-02-02 (×3): 3 mg via ORAL
  Filled 2014-01-31 (×3): qty 9

## 2014-01-31 MED ORDER — IBUPROFEN 600 MG PO TABS
600.0000 mg | ORAL_TABLET | Freq: Four times a day (QID) | ORAL | Status: DC | PRN
  Administered 2014-01-31 – 2014-02-02 (×6): 600 mg via ORAL
  Filled 2014-01-31 (×7): qty 1

## 2014-01-31 MED ORDER — IOHEXOL 350 MG/ML SOLN
80.0000 mL | Freq: Once | INTRAVENOUS | Status: AC | PRN
  Administered 2014-01-31: 80 mL via INTRAVENOUS

## 2014-01-31 MED ORDER — LORATADINE 10 MG PO TABS
10.0000 mg | ORAL_TABLET | Freq: Every day | ORAL | Status: DC
  Administered 2014-01-31 – 2014-02-04 (×5): 10 mg via ORAL
  Filled 2014-01-31 (×5): qty 1

## 2014-01-31 MED ORDER — SODIUM CHLORIDE 0.9 % IV BOLUS (SEPSIS)
500.0000 mL | Freq: Once | INTRAVENOUS | Status: AC
  Administered 2014-01-31: 500 mL via INTRAVENOUS

## 2014-01-31 MED ORDER — OLOPATADINE HCL 0.6 % NA SOLN
1.0000 | Freq: Every day | NASAL | Status: DC
  Filled 2014-01-31 (×2): qty 30.5

## 2014-01-31 MED ORDER — DILTIAZEM HCL 100 MG IV SOLR
5.0000 mg/h | Freq: Once | INTRAVENOUS | Status: AC
  Administered 2014-01-31: 5 mg/h via INTRAVENOUS
  Filled 2014-01-31: qty 100

## 2014-01-31 MED ORDER — DILTIAZEM HCL 25 MG/5ML IV SOLN
10.0000 mg | Freq: Once | INTRAVENOUS | Status: AC
  Administered 2014-01-31: 10 mg via INTRAVENOUS
  Filled 2014-01-31: qty 5

## 2014-01-31 MED ORDER — FAMOTIDINE 20 MG PO TABS
20.0000 mg | ORAL_TABLET | Freq: Two times a day (BID) | ORAL | Status: DC
  Administered 2014-01-31 – 2014-02-04 (×9): 20 mg via ORAL
  Filled 2014-01-31 (×11): qty 1

## 2014-01-31 MED ORDER — POTASSIUM CHLORIDE CRYS ER 20 MEQ PO TBCR
40.0000 meq | EXTENDED_RELEASE_TABLET | Freq: Once | ORAL | Status: AC
  Administered 2014-01-31: 40 meq via ORAL
  Filled 2014-01-31: qty 2

## 2014-01-31 MED ORDER — METOPROLOL TARTRATE 1 MG/ML IV SOLN
2.5000 mg | INTRAVENOUS | Status: DC | PRN
  Administered 2014-01-31 – 2014-02-04 (×9): 2.5 mg via INTRAVENOUS
  Filled 2014-01-31 (×10): qty 5

## 2014-01-31 MED ORDER — GUAIFENESIN ER 600 MG PO TB12
600.0000 mg | ORAL_TABLET | Freq: Two times a day (BID) | ORAL | Status: DC
  Administered 2014-01-31 – 2014-02-04 (×8): 600 mg via ORAL
  Filled 2014-01-31 (×11): qty 1

## 2014-01-31 MED ORDER — METOPROLOL TARTRATE 25 MG PO TABS
25.0000 mg | ORAL_TABLET | Freq: Three times a day (TID) | ORAL | Status: DC
  Administered 2014-01-31 – 2014-02-04 (×12): 25 mg via ORAL
  Filled 2014-01-31 (×15): qty 1

## 2014-01-31 MED ORDER — DILTIAZEM HCL 25 MG/5ML IV SOLN
20.0000 mg | Freq: Once | INTRAVENOUS | Status: AC
  Administered 2014-01-31: 20 mg via INTRAVENOUS
  Filled 2014-01-31: qty 5

## 2014-01-31 MED ORDER — POTASSIUM CHLORIDE 10 MEQ/100ML IV SOLN
10.0000 meq | INTRAVENOUS | Status: AC
  Administered 2014-01-31 (×2): 10 meq via INTRAVENOUS
  Filled 2014-01-31 (×2): qty 100

## 2014-01-31 MED ORDER — PANTOPRAZOLE SODIUM 40 MG PO TBEC
40.0000 mg | DELAYED_RELEASE_TABLET | Freq: Every day | ORAL | Status: DC
  Administered 2014-01-31 – 2014-02-04 (×5): 40 mg via ORAL
  Filled 2014-01-31 (×4): qty 1

## 2014-01-31 MED ORDER — METOPROLOL TARTRATE 12.5 MG HALF TABLET: 6.2500 mg | ORAL_TABLET | Freq: Two times a day (BID) | ORAL | Status: DC

## 2014-01-31 MED ORDER — FUROSEMIDE 20 MG PO TABS
20.0000 mg | ORAL_TABLET | Freq: Every day | ORAL | Status: DC
  Administered 2014-02-01 – 2014-02-04 (×4): 20 mg via ORAL
  Filled 2014-01-31 (×5): qty 1

## 2014-01-31 MED ORDER — ALBUTEROL SULFATE (2.5 MG/3ML) 0.083% IN NEBU
5.0000 mg | INHALATION_SOLUTION | Freq: Once | RESPIRATORY_TRACT | Status: AC
  Administered 2014-01-31: 5 mg via RESPIRATORY_TRACT
  Filled 2014-01-31: qty 6

## 2014-01-31 MED ORDER — CLINDAMYCIN PHOSPHATE 600 MG/50ML IV SOLN
600.0000 mg | Freq: Once | INTRAVENOUS | Status: DC
  Administered 2014-01-31: 600 mg via INTRAVENOUS
  Filled 2014-01-31: qty 50

## 2014-01-31 MED ORDER — METHYLPREDNISOLONE SODIUM SUCC 125 MG IJ SOLR
125.0000 mg | Freq: Once | INTRAMUSCULAR | Status: AC
  Administered 2014-01-31: 125 mg via INTRAVENOUS
  Filled 2014-01-31: qty 2

## 2014-01-31 MED ORDER — METOPROLOL SUCCINATE 12.5 MG HALF TABLET
12.5000 mg | ORAL_TABLET | Freq: Every day | ORAL | Status: DC
  Administered 2014-01-31: 12.5 mg via ORAL
  Filled 2014-01-31: qty 1

## 2014-01-31 MED ORDER — METOPROLOL TARTRATE 12.5 MG HALF TABLET: 12.5000 mg | ORAL_TABLET | Freq: Four times a day (QID) | ORAL | Status: DC

## 2014-01-31 MED ORDER — BUDESONIDE-FORMOTEROL FUMARATE 160-4.5 MCG/ACT IN AERO
2.0000 | INHALATION_SPRAY | Freq: Two times a day (BID) | RESPIRATORY_TRACT | Status: DC
  Administered 2014-01-31 – 2014-02-04 (×8): 2 via RESPIRATORY_TRACT
  Filled 2014-01-31: qty 6

## 2014-01-31 MED ORDER — CLINDAMYCIN PHOSPHATE 600 MG/50ML IV SOLN
600.0000 mg | Freq: Three times a day (TID) | INTRAVENOUS | Status: DC
  Administered 2014-01-31 – 2014-02-01 (×3): 600 mg via INTRAVENOUS
  Filled 2014-01-31 (×5): qty 50

## 2014-01-31 MED ORDER — DILTIAZEM HCL 100 MG IV SOLR
5.0000 mg/h | Freq: Once | INTRAVENOUS | Status: AC
  Administered 2014-01-31: 5 mg/h via INTRAVENOUS

## 2014-01-31 MED ORDER — ARIPIPRAZOLE 2 MG PO TABS
2.0000 mg | ORAL_TABLET | Freq: Every evening | ORAL | Status: DC
  Administered 2014-01-31 – 2014-02-02 (×3): 2 mg via ORAL
  Filled 2014-01-31 (×7): qty 1

## 2014-01-31 MED ORDER — KETOTIFEN FUMARATE 0.025 % OP SOLN
1.0000 [drp] | Freq: Two times a day (BID) | OPHTHALMIC | Status: DC | PRN
  Administered 2014-01-31: 1 [drp] via OPHTHALMIC
  Filled 2014-01-31: qty 5

## 2014-01-31 MED ORDER — FAMOTIDINE 40 MG PO TABS: 40.0000 mg | ORAL_TABLET | Freq: Every day | ORAL | Status: DC

## 2014-01-31 MED ORDER — SODIUM CHLORIDE 0.9 % IJ SOLN
3.0000 mL | Freq: Two times a day (BID) | INTRAMUSCULAR | Status: DC
  Administered 2014-01-31 – 2014-02-04 (×8): 3 mL via INTRAVENOUS

## 2014-01-31 MED ORDER — METHYLPREDNISOLONE SODIUM SUCC 125 MG IJ SOLR: 125.0000 mg | Freq: Once | INTRAMUSCULAR | Status: AC

## 2014-01-31 MED ORDER — SODIUM CHLORIDE 0.9 % IV SOLN
INTRAVENOUS | Status: DC
  Administered 2014-01-31: 06:00:00 via INTRAVENOUS

## 2014-01-31 MED ORDER — DULOXETINE HCL 60 MG PO CPEP
60.0000 mg | ORAL_CAPSULE | Freq: Every day | ORAL | Status: DC
  Administered 2014-01-31 – 2014-02-03 (×4): 60 mg via ORAL
  Filled 2014-01-31 (×6): qty 1

## 2014-01-31 MED ORDER — RIVAROXABAN 20 MG PO TABS
20.0000 mg | ORAL_TABLET | Freq: Every day | ORAL | Status: DC
Start: 2014-01-31 — End: 2014-02-04
  Administered 2014-01-31 – 2014-02-03 (×4): 20 mg via ORAL
  Filled 2014-01-31 (×5): qty 1

## 2014-01-31 MED ORDER — ALBUTEROL SULFATE (2.5 MG/3ML) 0.083% IN NEBU: 5.0000 mg | INHALATION_SOLUTION | RESPIRATORY_TRACT | Status: AC | PRN

## 2014-01-31 MED ORDER — TRAZODONE HCL 50 MG PO TABS
50.0000 mg | ORAL_TABLET | Freq: Every evening | ORAL | Status: DC | PRN
  Administered 2014-01-31 – 2014-02-03 (×3): 50 mg via ORAL
  Filled 2014-01-31 (×3): qty 1

## 2014-01-31 NOTE — ED Notes (Signed)
C/o sob - pt. Used nebulizer and inhalers with min. Relief.  EMS arrives pt. Diaphoretic, sob, i/e wheezing, ra sao2's 90's.  EMS gave x 2 neb treatments 5 mg Albuterol, followed by 5 mg albuterol/0.5 mg Atrovent. EMS did not give solumederol.  Hr 150's and bp 90/60's. Pt. Alert and cooperative.

## 2014-01-31 NOTE — Consult Note (Signed)
CARDIOLOGY CONSULT NOTE       Patient ID: SANDEEP DELAGARZA MRN: [id number] DOB/AGE: 11-13-1955 58 y.o.  Admit date: 01/31/2014 Referring Physician:  Gwendlyn Deutscher Primary Physician: Howard Pouch, DO Primary Cardiologist:  Nahser Reason for Consultation:  Tachycardia  Active Problems:   Narrow complex tachycardia   Sustained SVT   HPI:   58 yo obese black female with long standing asthma, OSA, dyspnea.  Former smoker Bilateral upper lobe PE;s  7/15.  On xarelto ? Hypercoagulable with family history of thrombosis.  Taking xarelto 20 mg daily on admission. Admitted with multiple somatic complaints including palpitations, dyspnea, right ear pain, sore throat.    Denies calf pain.  No chest pain.  Indicates frequent use of prednisone.  Reviewed all of her ECG;s in Epic  She normally runs sinus tachycardia rates 90-100.  She clearly had atrial flutter rates 150 in July when she was admitted for PE.  ECG today also shoes fib/flutter rate 155  ROS All other systems reviewed and negative except as noted above  Past Medical History  Diagnosis Date  . Asthma   . Hypertension   . GERD (gastroesophageal reflux disease)   . Anxiety   . Depression   . Family history of anesthesia complication     "daughter; causes her to pass out afterwards"  . Pulmonary embolism 12/28/2013    "2 clots in each lung"  . Hyperthyroidism   . HBZJIRCV(893.8)     "monthly" (12/28/2013)  . Migraine     "monthly" (12/28/2013)  . Osteoarthritis     "both knees; back of my neck; right pelvic bone" (12/28/2013)  . Thyroid disease   . Chronic lower back pain     Family History  Problem Relation Age of Onset  . Osteoarthritis Mother   . Asthma Mother   . Heart failure Mother     History   Social History  . Marital Status: Single    Spouse Name: N/A    Number of Children: N/A  . Years of Education: N/A   Occupational History  . Not on file.   Social History Main Topics  . Smoking status: Former Smoker -- 0.25  packs/day for 20 years    Types: Cigarettes    Quit date: 07/17/1999  . Smokeless tobacco: Never Used  . Alcohol Use: Yes     Comment: "drank some in my 30's"  . Drug Use: No  . Sexual Activity: No   Other Topics Concern  . Not on file   Social History Narrative  . No narrative on file    Past Surgical History  Procedure Laterality Date  . Abdominal hysterectomy    . Wisdom tooth extraction    . Thyroidectomy, partial Right 2005  . Nasal sinus surgery  2007  . Appendectomy    . Excisional hemorrhoidectomy    . Dilation and curettage of uterus    . Tubal ligation    . Breast cyst excision Right      . ARIPiprazole  2 mg Oral QPM  . budesonide-formoterol  2 puff Inhalation BID  . clindamycin (CLEOCIN) IV  600 mg Intravenous 3 times per day  . diltiazem (CARDIZEM) infusion  5 mg/hr Intravenous Once  . DULoxetine  60 mg Oral QHS  . famotidine  20 mg Oral BID  . loratadine  10 mg Oral Daily  . metoprolol succinate  12.5 mg Oral Daily  . metoprolol tartrate  12.5 mg Oral 4 times per day  . Olopatadine HCl  1 puff Nasal QHS  . pantoprazole  40 mg Oral Daily  . rivaroxaban  20 mg Oral Q supper  . sodium chloride  3 mL Intravenous Q12H   . sodium chloride 20 mL/hr at 01/31/14 0540    Physical Exam: Blood pressure 135/100, pulse 145, temperature 98.2 F (36.8 C), temperature source Oral, resp. rate 22, SpO2 97.00%.    Affect appropriate Obese black female  HEENT: normal Neck supple with no adenopathy JVP normal no bruits no thyromegaly Lungs basilar atelectasis no wheezing and good diaphragmatic motion Heart:  S1/S2 no murmur, no rub, gallop or click PMI normal Abdomen: benighn, BS positve, no tenderness, no AAA no bruit.  No HSM or HJR Distal pulses intact with no bruits Plus one bilateral  edema Neuro non-focal Skin warm and dry No muscular weakness   Labs:   Lab Results  Component Value Date   WBC 17.9* 01/31/2014   HGB 12.7 01/31/2014   HCT 39.2 01/31/2014     MCV 92.0 01/31/2014   PLT 429* 01/31/2014    Recent Labs Lab 01/31/14 0516  NA 139  K 2.8*  CL 96  CO2 27  BUN 8  CREATININE 0.60  CALCIUM 9.0  PROT 8.1  BILITOT 0.6  ALKPHOS 93  ALT 14  AST 16  GLUCOSE 126*   Lab Results  Component Value Date   TROPONINI <0.30 01/31/2014    Lab Results  Component Value Date   CHOL 215* 05/08/2013   CHOL 187 01/04/2008   Lab Results  Component Value Date   HDL 86 05/08/2013   HDL 57 01/04/2008   Lab Results  Component Value Date   LDLCALC 110* 05/08/2013   LDLCALC 104* 01/04/2008   Lab Results  Component Value Date   TRIG 95 05/08/2013   TRIG 132 01/04/2008   Lab Results  Component Value Date   CHOLHDL 2.5 05/08/2013   CHOLHDL 3.3 Ratio 01/04/2008   No results found for this basename: LDLDIRECT      Radiology: Ct Angio Chest Pe W/cm &/or Wo Cm  01/31/2014   CLINICAL DATA:  Shortness of breath, tachycardia.  EXAM: CT ANGIOGRAPHY CHEST WITH CONTRAST  TECHNIQUE: Multidetector CT imaging of the chest was performed using the standard protocol during bolus administration of intravenous contrast. Multiplanar CT image reconstructions and MIPs were obtained to evaluate the vascular anatomy.  CONTRAST:  75mL OMNIPAQUE IOHEXOL 350 MG/ML SOLN  COMPARISON:  12/27/2013  FINDINGS: Previously seen left upper lobe and right lower lobe pulmonary emboli have resolved. No acute pulmonary embolus. Heart is normal size. Aorta is normal caliber. No mediastinal, hilar, or axillary adenopathy. Chest wall soft tissues are unremarkable.  Predominately linear densities noted in the lower lobes bilaterally most compatible with atelectasis. No pleural effusions.  Imaging into the upper abdomen shows no acute findings.  Review of the MIP images confirms the above findings.  IMPRESSION: No evidence of pulmonary embolus.  Bilateral lower lobe atelectasis.   Electronically Signed   By: Rolm Baptise M.D.   On: 01/31/2014 12:22   Dg Chest Port 1 View  01/31/2014    CLINICAL DATA:  Shortness of breath  EXAM: PORTABLE CHEST - 1 VIEW  COMPARISON:  04/06/2013  FINDINGS: No cardiomegaly when considering technique. Chronic mild aortic tortuosity. Chronic elevation of the right diaphragm.  There is no edema, consolidation, effusion, or pneumothorax.  IMPRESSION: Stable exam.   No active disease.   Electronically Signed   By: Jorje Guild M.D.   On:  01/31/2014 05:45    EKG:  afib flutter rate 155 no acute ST changes    ASSESSMENT AND PLAN:  Tachycardia:  Recurrent fib/flutter.  She is not wheezing currently.  On Toprol at home  Change to lopressor 25 q 8 while in hosptial.  Needs echo No assessment of EF.  Reviewed her CT from 7/15 and no coronary calcium and widely patent proximal and mid coronary arteries.  Start flecainide 50 bid if EF normal.  Have asked EP to see as she may be a reasonable candidate for ablation.  Continue xarelto for ? Hypercoagulable state, PE and afib.  Suspect she should be anticoagulated chronically Dyspnea:  Reviewed CT from today and no recurrent PE.  Echo pending  Some concern for tachycardia mediated DCM  BNP 1300  Low dose diuretic and supplement K  Note have not written for flecainide as echo not done yet and need to make sure EF is normal  Signed: Jenkins Rouge 01/31/2014, 12:40 PM

## 2014-01-31 NOTE — ED Notes (Signed)
Dr. Steinl back at the bedside.  

## 2014-01-31 NOTE — ED Provider Notes (Addendum)
CSN: [id number]     Arrival date & time 01/31/14  0439 History   First MD Initiated Contact with Patient 01/31/14 [id number]     Chief Complaint  Patient presents with  . Shortness of Breath  . Tachycardia     (Consider location/radiation/quality/duration/timing/severity/associated sxs/prior Treatment) Patient is a 58 y.o. female presenting with shortness of breath. The history is provided by the patient.  Shortness of Breath Associated symptoms: cough, fever, sore throat and wheezing   Associated symptoms: no abdominal pain, no chest pain, no headaches, no neck pain, no rash and no vomiting   pt with hx asthma, PE, c/o chest congestion, increased non productive coughing, wheezing for the past few days. +nasal congestion. +right sided throat pain for past few days w right ear pressure.  Is able to swallow.  Subjective fever, no chills/sweats. Has been using home treatments w only mild relief of symptoms. Tonight increased wheezing and sob.  ems was called. Pt noted in narrow complex tachycardia. Pt denies sense of palpitations or rapid heart beat. Denies hx svt or afib. No increased leg edema or calf pain. No orthopnea or pnd. No current chest pain or discomfort.      Past Medical History  Diagnosis Date  . Asthma   . Hypertension   . GERD (gastroesophageal reflux disease)   . Anxiety   . Depression   . Family history of anesthesia complication     "daughter; causes her to pass out afterwards"  . Pulmonary embolism 12/28/2013    "2 clots in each lung"  . Hyperthyroidism   . FBPZWCHE(527.7)     "monthly" (12/28/2013)  . Migraine     "monthly" (12/28/2013)  . Osteoarthritis     "both knees; back of my neck; right pelvic bone" (12/28/2013)  . Thyroid disease   . Chronic lower back pain    Past Surgical History  Procedure Laterality Date  . Abdominal hysterectomy    . Wisdom tooth extraction    . Thyroidectomy, partial Right 2005  . Nasal sinus surgery  2007  . Appendectomy    .  Excisional hemorrhoidectomy    . Dilation and curettage of uterus    . Tubal ligation    . Breast cyst excision Right    Family History  Problem Relation Age of Onset  . Osteoarthritis Mother   . Asthma Mother   . Heart failure Mother    History  Substance Use Topics  . Smoking status: Former Smoker -- 0.25 packs/day for 20 years    Types: Cigarettes    Quit date: 07/17/1999  . Smokeless tobacco: Never Used  . Alcohol Use: Yes     Comment: "drank some in my 20's"   OB History   Grav Para Term Preterm Abortions TAB SAB Ect Mult Living                 Review of Systems  Constitutional: Positive for fever. Negative for chills.  HENT: Positive for sore throat. Negative for drooling and trouble swallowing.   Eyes: Negative for redness.  Respiratory: Positive for cough, shortness of breath and wheezing.   Cardiovascular: Negative for chest pain, palpitations and leg swelling.  Gastrointestinal: Negative for vomiting, abdominal pain and diarrhea.  Genitourinary: Negative for flank pain.  Musculoskeletal: Negative for back pain and neck pain.  Skin: Negative for rash.  Neurological: Negative for headaches.  Hematological: Does not bruise/bleed easily.  Psychiatric/Behavioral: Negative for confusion.      Allergies  Caffeine; Ciprofloxacin; Lisinopril;  Vicodin; Erythromycin; Oxycodone; and Sulfamethoxazole-trimethoprim  Home Medications   Prior to Admission medications   Medication Sig Start Date End Date Taking? Authorizing Provider  albuterol (PROVENTIL HFA;VENTOLIN HFA) 108 (90 BASE) MCG/ACT inhaler Inhale 2 puffs into the lungs every 6 (six) hours as needed. For shortness of breath    Historical Provider, MD  albuterol (PROVENTIL) (2.5 MG/3ML) 0.083% nebulizer solution Take 3 mLs (2.5 mg total) by nebulization every 6 (six) hours as needed. For shortness of breath 04/07/13   Hilton Sinclair, MD  amoxicillin-clavulanate (AUGMENTIN) 875-125 MG per tablet Take 1 tablet  by mouth 2 (two) times daily. 01/29/14   Melony Overly, MD  ARIPiprazole (ABILIFY) 2 MG tablet Take 2 mg by mouth daily.    Historical Provider, MD  azelastine (OPTIVAR) 0.05 % ophthalmic solution Place 1 drop into both eyes 2 (two) times daily as needed. For dry/irritated eyes     Historical Provider, MD  budesonide-formoterol (SYMBICORT) 160-4.5 MCG/ACT inhaler Inhale 2 puffs into the lungs 2 (two) times daily.      Historical Provider, MD  cetirizine (ZYRTEC) 10 MG tablet Take 1 tablet (10 mg total) by mouth every morning. 04/07/13   Hilton Sinclair, MD  dexlansoprazole (DEXILANT) 60 MG capsule Take 60 mg by mouth daily.    Historical Provider, MD  diclofenac sodium (VOLTAREN) 1 % GEL Apply 1 application topically 2 (two) times daily as needed. On knees for pain/inflamation     Historical Provider, MD  DULoxetine (CYMBALTA) 60 MG capsule Take 60 mg by mouth at bedtime.     Historical Provider, MD  ibuprofen (ADVIL,MOTRIN) 600 MG tablet Take 1 tablet (600 mg total) by mouth every 6 (six) hours as needed. 12/23/13   Montine Circle, PA-C  metoprolol-hydrochlorothiazide (LOPRESSOR HCT) 50-25 MG per tablet Take 1 tablet by mouth daily.    Historical Provider, MD  Olopatadine HCl 0.6 % SOLN Place 1 puff into the nose at bedtime.     Historical Provider, MD  ranitidine (ZANTAC) 300 MG tablet Take 300 mg by mouth at bedtime.     Historical Provider, MD  Rivaroxaban (XARELTO) 15 MG TABS tablet Take 1 tablet (15 mg total) by mouth 2 (two) times daily with a meal. 12/28/13 01/17/14  Archie Patten, MD  rivaroxaban (XARELTO) 20 MG TABS tablet Take 1 tablet (20 mg total) by mouth daily with supper. 01/18/14 03/29/14  Archie Patten, MD  traZODone (DESYREL) 50 MG tablet Take 50 mg by mouth at bedtime as needed for sleep.     Historical Provider, MD  verapamil (CALAN) 80 MG tablet Take 1 tablet (80 mg total) by mouth 3 (three) times daily. 04/02/13   Renee A Kuneff, DO   BP 103/72  Pulse 155  Resp 23  SpO2  99% Physical Exam  Nursing note and vitals reviewed. Constitutional: She is oriented to person, place, and time. She appears well-developed and well-nourished. She appears distressed.  tachycardic  HENT:  Mouth/Throat: Oropharynx is clear and moist.  Pharynx erythematous. +asymmetric pharyngeal/tonsil swelling, R>L. ?early/mild peritonsillar abscess.  tms normal.   Eyes: Conjunctivae are normal. No scleral icterus.  Neck: Normal range of motion. Neck supple. No tracheal deviation present.  No stiffness or rigidity  Cardiovascular: Regular rhythm, normal heart sounds and intact distal pulses.  Exam reveals no gallop and no friction rub.   No murmur heard. Markedly tachycardic.   Pulmonary/Chest: Effort normal. No respiratory distress. She has wheezes.  Abdominal: Soft. Normal appearance and bowel sounds are  normal. She exhibits no distension. There is no tenderness.  Morbidly obese  Genitourinary:  No cva tenderness.   Musculoskeletal: She exhibits no edema and no tenderness.  Neurological: She is alert and oriented to person, place, and time.  Skin: Skin is warm and dry. No rash noted.  Psychiatric: She has a normal mood and affect.    ED Course  Procedures (including critical care time) Labs Review   EKG Interpretation   Date/Time:  Friday January 31 2014 05:20:28 EDT Ventricular Rate:  155 PR Interval:    QRS Duration: 87 QT Interval:  304 QTC Calculation: 488 R Axis:   78 Text Interpretation:  Narrow QRS tachycardia Non-specific ST-t changes  Repolarization abnormality, prob rate related Confirmed by Ashok Cordia  MD,  Lennette Bihari ([id number]) on 01/31/2014 5:23:51 AM      MDM  Iv ns. Continuous pulse ox and monitor. o2 [place].  Labs. Ecg. Cxr.  Reviewed nursing notes and prior charts for additional history.   Pt initially in narrow complex tachycardia, during initial exam, spontaneously converts to sinus rhythm, rate 104 - before repeat ecg, pt back in narrow complex tachycardia rate  160, cardizem iv.   Recheck, hr back to 160, narrow complex. cardizem bolus/gtt.  Recheck hr 80s, sinus. Flips back to nct 150, and back and forth.  Wheezing improved. No distress.   k low, mg added to labs. kcl iv and po.  ent consulted re suspected peritonsillar abscess.  Family practice called to admit re asthma exacerbation, new onset narrow complex tachycardia, hypokalemia.   CRITICAL CARE  New onset narrow complex tachycardia, resp distress/asthma exacerbation, hypokalemia.  Performed by: Mirna Mires Total critical care time: 40 Critical care time was exclusive of separately billable procedures and treating other patients. Critical care was necessary to treat or prevent imminent or life-threatening deterioration. Critical care was time spent personally by me on the following activities: development of treatment plan with patient and/or surrogate as well as nursing, discussions with consultants, evaluation of patient's response to treatment, examination of patient, obtaining history from patient or surrogate, ordering and performing treatments and interventions, ordering and review of laboratory studies, ordering and review of radiographic studies, pulse oximetry and re-evaluation of patient's condition.   [facility] residents requests temp orders.  Dr Redmond Baseman, ENT, states will see in ED, but is tied up in [place] for next hour.  He requests we give clinda iv, and keep pt in ED in the event that he needs to drain - he will be here as soon as able. Nurse informed of plan.        Mirna Mires, MD 01/31/14 [phone]

## 2014-01-31 NOTE — ED Notes (Signed)
Dr Ashok Cordia at the bedside.  Patients heart rate 150-160 "looks like SVT" per Dr Ashok Cordia.  After observing patient for approximately 5 minutes heart rate dropped to 110 and looks like SR

## 2014-01-31 NOTE — Consult Note (Signed)
Reason for Consult:sore throat Referring Physician: ER  [name] White is an 58 y.o. female.  HPI: 58 year old female with recent shortness of breath being admitted for tachycardia and hypokalemia also complains of 4-5 day history of right-sided throat pain, subjective fever, and right ear pressure.  She says now the left side of the throat is bothering her also.  She is able to swallow.  Voice is unchanged.  She notices swelling in the right upper neck.  Symptoms are moderate.  Past Medical History  Diagnosis Date  . Asthma   . Hypertension   . GERD (gastroesophageal reflux disease)   . Anxiety   . Depression   . Family history of anesthesia complication     "daughter; causes her to pass out afterwards"  . Pulmonary embolism 12/28/2013    "2 clots in each lung"  . Hyperthyroidism   . MMNOTRRN(165.7)     "monthly" (12/28/2013)  . Migraine     "monthly" (12/28/2013)  . Osteoarthritis     "both knees; back of my neck; right pelvic bone" (12/28/2013)  . Thyroid disease   . Chronic lower back pain     Past Surgical History  Procedure Laterality Date  . Abdominal hysterectomy    . Wisdom tooth extraction    . Thyroidectomy, partial Right 2005  . Nasal sinus surgery  2007  . Appendectomy    . Excisional hemorrhoidectomy    . Dilation and curettage of uterus    . Tubal ligation    . Breast cyst excision Right     Family History  Problem Relation Age of Onset  . Osteoarthritis Mother   . Asthma Mother   . Heart failure Mother     Social History:  reports that she quit smoking about 14 years ago. Her smoking use included Cigarettes. She has a 5 pack-year smoking history. She has never used smokeless tobacco. She reports that she drinks alcohol. She reports that she does not use illicit drugs.  Allergies:  Allergies  Allergen Reactions  . Caffeine Other (See Comments)    Migraine  . Ciprofloxacin Hives  . Lisinopril Cough  . Vicodin [Hydrocodone-Acetaminophen] Nausea And  Vomiting and Other (See Comments)    Headache   . Erythromycin Rash  . Oxycodone Nausea And Vomiting    headache  . Sulfamethoxazole-Trimethoprim Rash    Medications: I have reviewed the patient's current medications.  Results for orders placed during the hospital encounter of 01/31/14 (from the past 48 hour(s))  CBC     Status: Abnormal   Collection Time    01/31/14  5:16 AM      Result Value Ref Range   WBC 17.9 (*) 4.0 - 10.5 K/uL   RBC 4.26  3.87 - 5.11 MIL/uL   Hemoglobin 12.7  12.0 - 15.0 g/dL   HCT 39.2  36.0 - 46.0 %   MCV 92.0  78.0 - 100.0 fL   MCH 29.8  26.0 - 34.0 pg   MCHC 32.4  30.0 - 36.0 g/dL   RDW 15.9 (*) 11.5 - 15.5 %   Platelets 429 (*) 150 - 400 K/uL  COMPREHENSIVE METABOLIC PANEL     Status: Abnormal   Collection Time    01/31/14  5:16 AM      Result Value Ref Range   Sodium 139  137 - 147 mEq/L   Potassium 2.8 (*) 3.7 - 5.3 mEq/L   Comment: CRITICAL RESULT CALLED TO, READ BACK BY AND VERIFIED WITH:  JACOBELI B,RN 01/31/14 0558 WAYK   Chloride 96  96 - 112 mEq/L   CO2 27  19 - 32 mEq/L   Glucose, Bld 126 (*) 70 - 99 mg/dL   BUN 8  6 - 23 mg/dL   Creatinine, Ser 0.60  0.50 - 1.10 mg/dL   Calcium 9.0  8.4 - 10.5 mg/dL   Total Protein 8.1  6.0 - 8.3 g/dL   Albumin 3.2 (*) 3.5 - 5.2 g/dL   AST 16  0 - 37 U/L   ALT 14  0 - 35 U/L   Alkaline Phosphatase 93  39 - 117 U/L   Total Bilirubin 0.6  0.3 - 1.2 mg/dL   GFR calc non Af Amer >90  >90 mL/min   GFR calc Af Amer >90  >90 mL/min   Comment: (NOTE)     The eGFR has been calculated using the CKD EPI equation.     This calculation has not been validated in all clinical situations.     eGFR's persistently <90 mL/min signify possible Chronic Kidney     Disease.   Anion gap 16 (*) 5 - 15  PRO B NATRIURETIC PEPTIDE     Status: Abnormal   Collection Time    01/31/14  5:16 AM      Result Value Ref Range   Pro B Natriuretic peptide (BNP) 1316.0 (*) 0 - 125 pg/mL  TROPONIN I     Status: None    Collection Time    01/31/14  5:16 AM      Result Value Ref Range   Troponin I <0.30  <0.30 ng/mL   Comment:            Due to the release kinetics of cTnI,     a negative result within the first hours     of the onset of symptoms does not rule out     myocardial infarction with certainty.     If myocardial infarction is still suspected,     repeat the test at appropriate intervals.  MAGNESIUM     Status: None   Collection Time    01/31/14  5:16 AM      Result Value Ref Range   Magnesium 1.7  1.5 - 2.5 mg/dL  LACTIC ACID, PLASMA     Status: None   Collection Time    01/31/14  6:42 AM      Result Value Ref Range   Lactic Acid, Venous 1.2  0.5 - 2.2 mmol/L  URINALYSIS, ROUTINE W REFLEX MICROSCOPIC     Status: Abnormal   Collection Time    01/31/14  7:45 AM      Result Value Ref Range   Color, Urine YELLOW  YELLOW   APPearance CLEAR  CLEAR   Specific Gravity, Urine 1.008  1.005 - 1.030   pH 6.5  5.0 - 8.0   Glucose, UA NEGATIVE  NEGATIVE mg/dL   Hgb urine dipstick TRACE (*) NEGATIVE   Bilirubin Urine NEGATIVE  NEGATIVE   Ketones, ur 15 (*) NEGATIVE mg/dL   Protein, ur NEGATIVE  NEGATIVE mg/dL   Urobilinogen, UA 1.0  0.0 - 1.0 mg/dL   Nitrite NEGATIVE  NEGATIVE   Leukocytes, UA TRACE (*) NEGATIVE  URINE MICROSCOPIC-ADD ON     Status: Abnormal   Collection Time    01/31/14  7:45 AM      Result Value Ref Range   Squamous Epithelial / LPF FEW (*) RARE   WBC, UA 3-6  <  3 WBC/hpf   RBC / HPF 0-2  <3 RBC/hpf   Bacteria, UA RARE  RARE   Casts GRANULAR CAST (*) NEGATIVE  RAPID STREP SCREEN     Status: None   Collection Time    01/31/14  7:48 AM      Result Value Ref Range   Streptococcus, Group A Screen (Direct) NEGATIVE  NEGATIVE   Comment: (NOTE)     A Rapid Antigen test may result negative if the antigen level in the     sample is below the detection level of this test. The FDA has not     cleared this test as a stand-alone test therefore the rapid antigen     negative  result has reflexed to a Group A Strep culture.    Dg Chest Port 1 View  01/31/2014   CLINICAL DATA:  Shortness of breath  EXAM: PORTABLE CHEST - 1 VIEW  COMPARISON:  04/06/2013  FINDINGS: No cardiomegaly when considering technique. Chronic mild aortic tortuosity. Chronic elevation of the right diaphragm.  There is no edema, consolidation, effusion, or pneumothorax.  IMPRESSION: Stable exam.   No active disease.   Electronically Signed   By: Jorje Guild M.D.   On: 01/31/2014 05:45    Review of Systems  Constitutional: Positive for fever.  HENT: Positive for ear pain and sore throat.   Respiratory: Positive for cough, shortness of breath and wheezing.    Blood pressure 114/63, pulse 72, temperature 99.1 F (37.3 C), temperature source Oral, resp. rate 18, SpO2 99.00%. Physical Exam  Constitutional: She is oriented to person, place, and time. She appears well-developed and well-nourished. No distress.  HENT:  Head: Normocephalic and atraumatic.  Right Ear: External ear normal.  Left Ear: External ear normal.  Nose: Nose normal.  Mouth/Throat: Oropharynx is clear and moist. No oropharyngeal exudate.  TMs normal.  No significant peritonsillar fullness.  Oropharynx with some redness.  Tonsils 2+.  Eyes: Conjunctivae and EOM are normal. Pupils are equal, round, and reactive to light.  Neck: Normal range of motion. Neck supple.  Right zone 2 tender without significant edema.  Cardiovascular: Normal rate.   Respiratory: Effort normal.  Musculoskeletal: Normal range of motion.  Neurological: She is alert and oriented to person, place, and time. No cranial nerve deficit.  Skin: Skin is warm and dry.  Psychiatric: She has a normal mood and affect. Her behavior is normal. Judgment and thought content normal.    Assessment/Plan: Acute pharyngitis I see no evidence of peritonsillar abscess but her story could suggest an early cellulitis.  I think treating with clindamycin is very reasonable  in her situation but she does not need surgical drainage.  She can follow-up with Dr. Wilburn Cornelia, her ENT, in the future and certainly call us back with any problems.  Susana Duell 01/31/2014, 8:57 AM

## 2014-01-31 NOTE — Progress Notes (Signed)
PGY-3 Interim Note  Pt seen at bedside. Cardiology recommendations noted and appreciated. Pt states she is feeling some better now, but is still very "tired." Noted to be in NSR and rate-controlled currently. Pending EP consultation and echocardiogram. Ordered for Mucinex for congestion. Otherwise continue per prior progress notes.  Emmaline Kluver, MD PGY-3, [facility] 01/31/2014, 3:52 PM FPTS Service pager: [phone] (text pages welcome through [facility])

## 2014-01-31 NOTE — H&P (Signed)
[facility] Admission History and Physical Service Pager: [phone]  Patient name: [name] White Medical record number: [id number] Date of birth: 09-12-1955 Age: 58 y.o. Gender: female  Primary Care Provider: Howard Pouch, DO Consultants: ENT, cardiology Code Status: FULL per discussion on admission  Chief Complaint: Sore throat, shortness of breath, chest congestion and palpitations  Assessment and Plan: [name] White is a 58 y.o. female presenting with multiple complaints including sore throat, chest congestion/tightness, and palptitaions. PMH is significant for persistent asthma vs COPD in former smoker, depression, HTN, hypothyroidism, LE edema, obesity, bilateral knee OA, recent dx with multiple PEs 12/2013.  # Dyspnea - DDx includes asthma exacerbation though no wheezes and moderately good air entry on exam due to examining after albuterol treatment. Consider heart failure with increased BNP 1316 (no prior to compare) though CXR unremarkable, worsened PE in tachycardic and dyspneic patient with FH of PE in son at 20. Dyspnea improved after 1 albuterol tx.  - Admit to inpatient Family Medicine service on telemetry. Attending Dr Gwendlyn Deutscher. - Repeat CT angio chest - if new/worsened PE, would switch to another anticoagulant such as heparin - Echocardiogram - Consulted cardiology. - Albuterol PRN and Continue home symbicort - on 4L O2 by [place]. - Sleep study outpatient recommended with likely OHS/OSA component. - Hypercoag workup recommended - can perform as outpatient.  # Tachycardia - Rate 150-160s, narrow complex, with EKG showing regular rhythm though p waves difficult to find. Consider paroxysmal atrial fibrillation with RVR with exam concerning for irregular rhythm. QTc 488. Afebrile, troponin negative x 1. - Monitor on tele - Cycle troponins - Repeat EKG - Cardiology consult - Continue cardizem drip through today - With recent dx with PE in July,  will repeat CT angio chest to eval for xarelto failure.  # Throat pain - Mild fullness right o/p. Seen in ED by ENT who think this is more likely cellulitis and recommend clindamycin. Subjective fevers, WBC 17.9, lactic acid 1.2. Strep culture neg 8/5, rapid strep today negative. - Clindamycin 600mg  q8 hours IV x 1 day, consider transition to PO tomorrow. - F/u repeat strep culture. - Continuous pulse oximetry. - F/u patient's ENT outpatient.  # Hypokalemia - K 2.8, likely due to days of poorer PO than baseline with dehydrated-appearing urine. Normal BMET otherwise. - Repleted K 54mEq PO and 79mEq IV x 2 - Recheck BMET PM and tomorrow AM - Checking Mg  FEN/GI: Full liquids, advance as tolerated Prophylaxis: On full dose anticoagulation.  Disposition: Pending improvement in tachycardia, treatment for retropharyngeal cellulitis with IV clindamycin, and monitoring and workup of dyspnea.   History of Present Illness: [name] White is a 58 y.o. female presenting with multiple complaints, including throat pain, chest palpitations, and dyspnea with chest congestion. She reports that 5 days ago she began having swelling all along her neck under her chin, sore throat, right ear pain, subjective fever, and palpitations. She took ibuprofen for throat pain but it did not improve, so she went to [facility] 2 days later where she had negative rapid strep and strep culture. She was started on augmentin for presumed developing bacterial sinusitis, which she never picked up. Since that time, she has had shortness of breath, chest tightness, chest conguestion, coughing up creamy phlegm, post nasal drip, subjective fever, worsened PND (has baseline PND reportedly since 2011). She denies nausea, vomiting, abdominal pain, diarrhea, dysuria, change in liquid PO (though she has less appetite), stiff neck, chest pain, or  syncope. She has some dizziness with ear pain. Possible church members are sick contacts.  She does not use O2 or CPAP at home.  EMS found her in narrow complex tachy, HR 150, wheezing diffusely, and congested and dosed albuterol treatment.   In the ED, she has received albuterol 5mg , solumedrol 125 mg, Kdur 55mEq PO anmd 10mg  IV x 2, diltiazem bolus followed by drip with brief improvement in heart rate, NS 1/2 bolus and slow infusion, and clindamycin 600mg  IV per phone discussion with ENT who was consulted.  Review Of Systems: Per HPI with the following additions: None Otherwise 12 point review of systems was performed and was unremarkable.  Patient Active Problem List   Diagnosis Date Noted  . Pulmonary embolism 12/27/2013  . Vocal cord dysfunction 11/12/2013  . Lower extremity edema 09/18/2013  . Rotator cuff impingement syndrome of right shoulder 09/18/2013  . Mixed headache 01/06/2012  . Hypothyroidism 11/01/2011  . OVERACTIVE BLADDER 04/18/2008  . OSTEOARTHRITIS, KNEES, BILATERAL 04/18/2008  . POLYARTHRITIS 09/14/2007  . OBESITY, NOS 08/24/2006  . DEPRESSIVE DISORDER, NOS 08/24/2006  . RESTLESS LEGS SYNDROME 08/24/2006  . HYPERTENSION, BENIGN SYSTEMIC 08/24/2006  . RHINITIS, ALLERGIC 08/24/2006  . ASTHMA, PERSISTENT 08/24/2006  . GASTROESOPHAGEAL REFLUX, NO ESOPHAGITIS 08/24/2006   Past Medical History: Past Medical History  Diagnosis Date  . Asthma   . Hypertension   . GERD (gastroesophageal reflux disease)   . Anxiety   . Depression   . Family history of anesthesia complication     "daughter; causes her to pass out afterwards"  . Pulmonary embolism 12/28/2013    "2 clots in each lung"  . Hyperthyroidism   . FUXNATFT(732.2)     "monthly" (12/28/2013)  . Migraine     "monthly" (12/28/2013)  . Osteoarthritis     "both knees; back of my neck; right pelvic bone" (12/28/2013)  . Thyroid disease   . Chronic lower back pain    Past Surgical History: Past Surgical History  Procedure Laterality Date  . Abdominal hysterectomy    . Wisdom tooth extraction    .  Thyroidectomy, partial Right 2005  . Nasal sinus surgery  2007  . Appendectomy    . Excisional hemorrhoidectomy    . Dilation and curettage of uterus    . Tubal ligation    . Breast cyst excision Right    Social History: History  Substance Use Topics  . Smoking status: Former Smoker -- 0.25 packs/day for 20 years    Types: Cigarettes    Quit date: 07/17/1999  . Smokeless tobacco: Never Used  . Alcohol Use: Yes     Comment: "drank some in my 30's"   Additional social history: Denies drug use Please also refer to relevant sections of EMR.  Family History: Family History  Problem Relation Age of Onset  . Osteoarthritis Mother   . Asthma Mother   . Heart failure Mother   PE in son at 61 years old  Allergies and Medications: Allergies  Allergen Reactions  . Caffeine Other (See Comments)    Migraine  . Ciprofloxacin Hives  . Lisinopril Cough  . Vicodin [Hydrocodone-Acetaminophen] Nausea And Vomiting and Other (See Comments)    Headache   . Erythromycin Rash  . Oxycodone Nausea And Vomiting    headache  . Sulfamethoxazole-Trimethoprim Rash   Current Facility-Administered Medications on File Prior to Encounter  Medication Dose Route Frequency Provider Last Rate Last Dose  . diphenhydrAMINE (BENADRYL) capsule 50 mg  50 mg Oral Q6H PRN  Minerva Ends, MD   50 mg at 01/06/12 1458   Current Outpatient Prescriptions on File Prior to Encounter  Medication Sig Dispense Refill  . albuterol (PROVENTIL HFA;VENTOLIN HFA) 108 (90 BASE) MCG/ACT inhaler Inhale 2 puffs into the lungs every 6 (six) hours as needed. For shortness of breath      . albuterol (PROVENTIL) (2.5 MG/3ML) 0.083% nebulizer solution Take 3 mLs (2.5 mg total) by nebulization every 6 (six) hours as needed. For shortness of breath  75 mL  12  . ARIPiprazole (ABILIFY) 2 MG tablet Take 2 mg by mouth daily.      . budesonide-formoterol (SYMBICORT) 160-4.5 MCG/ACT inhaler Inhale 2 puffs into the lungs 2 (two) times  daily.        . cetirizine (ZYRTEC) 10 MG tablet Take 1 tablet (10 mg total) by mouth every morning.  30 tablet  11  . diclofenac sodium (VOLTAREN) 1 % GEL Apply 1 application topically 2 (two) times daily as needed. On knees for pain/inflamation       . DULoxetine (CYMBALTA) 60 MG capsule Take 60 mg by mouth at bedtime.       Marland Kitchen ibuprofen (ADVIL,MOTRIN) 600 MG tablet Take 1 tablet (600 mg total) by mouth every 6 (six) hours as needed.  30 tablet  0  . metoprolol-hydrochlorothiazide (LOPRESSOR HCT) 50-25 MG per tablet Take 1 tablet by mouth daily.      . Olopatadine HCl 0.6 % SOLN Place 1 puff into the nose at bedtime.       . ranitidine (ZANTAC) 300 MG tablet Take 300 mg by mouth at bedtime.       . rivaroxaban (XARELTO) 20 MG TABS tablet Take 1 tablet (20 mg total) by mouth daily with supper.  30 tablet  3  . traZODone (DESYREL) 50 MG tablet Take 50 mg by mouth at bedtime as needed for sleep.       . verapamil (CALAN) 80 MG tablet Take 1 tablet (80 mg total) by mouth 3 (three) times daily.  90 tablet  0  . azelastine (OPTIVAR) 0.05 % ophthalmic solution Place 1 drop into both eyes 2 (two) times daily as needed. For dry/irritated eyes       . dexlansoprazole (DEXILANT) 60 MG capsule Take 60 mg by mouth daily.        Objective: BP 103/72  Pulse 155  Temp(Src) 99.1 F (37.3 C) (Oral)  Resp 23  SpO2 96% Exam: General: NAD, morbidly obese female lying in bed HEENT: AT/[place], sclera clear, EOMI, neck supple, mild fullness under chin though some of this likely baseline, o/p difficult to visualize with quantity of redundant o/p tissue and large tongue, but able to appreciate mild - moderate fullness right o/p, no stridor, neck supple, no appreciable anterior or posterior cervical LAD, TMs clear bilaterally Cardiovascular: tachycardia to 150s, occasional PVC vs irregular rhythm, II/VI systolic flow murmur, difficult to palpate bilateral DP pulse Respiratory: Mild deceased air entry throughout and fine  crackles LLL, normal effort, [place] in place Abdomen: Obese, S/NT/ND Extremities: No LE edema or calf tenderness, no asymmetry Skin: No rash or cyanosis Neuro: Awake, alert, normal speech, did not eval gait  Labs and Imaging: CBC BMET   Recent Labs Lab 01/31/14 0516  WBC 17.9*  HGB 12.7  HCT 39.2  PLT 429*    Recent Labs Lab 01/31/14 0516  NA 139  K 2.8*  CL 96  CO2 27  BUN 8  CREATININE 0.60  GLUCOSE 126*  CALCIUM  9.0     BNP 1316 Lactic acid 1.2 EKG: Narrow complex tachycardia, rate 155, unable to identify P waves Portable 2 view CXR: IMPRESSION:  Stable exam. No active disease.   Hilton Sinclair, MD 01/31/2014, 7:28 AM PGY-3, [facility] pager: [phone], text pages welcome

## 2014-01-31 NOTE — Consult Note (Signed)
ELECTROPHYSIOLOGY CONSULT NOTE    Patient ID: [name] White MRN: [id number], DOB/AGE: [birth year] 58 y.o.  Admit date: 01/31/2014 Date of Consult: 01-31-2014  Primary Physician: Howard Pouch, DO Primary Cardiologist: [place]  Reason for Consultation: tachycardia  HPI:  [name] White is a 58 y.o. female with a past medical history significant for asthma, hypertension, prior PE (on Xarelto), hyperthyroidism, and anxiety.  She was admitted in July of this year with PE and was in atrial flutter at the time of admission. She was not seen by cardiology at that time, but was started on Xarelto.    She presented today to the emergency room with multiple somatic complaints.  She was found to be tachycardic and hypokalemic and has been admitted.  Due to recent PE, she underwent repeat CT of the chest which did not demonstrate recurrent PE.  Potassium is being repleted.  She has been placed on Metoprolol for rate control.  EP has been asked to evaluate for treatment options. Cardiac enzymes are negative. WBC 17.9. Temp 99.1 on admission.    Echo ordered but not yet done.   ROS negative except as outlined above.   Past Medical History  Diagnosis Date  . Asthma   . Hypertension   . GERD (gastroesophageal reflux disease)   . Anxiety   . Depression   . Family history of anesthesia complication     "daughter; causes her to pass out afterwards"  . Pulmonary embolism 12/28/2013    "2 clots in each lung"  . Hyperthyroidism   . Migraine     "monthly" (12/28/2013)  . Osteoarthritis     "both knees; back of my neck; right pelvic bone" (12/28/2013)  . Chronic lower back pain      Surgical History:  Past Surgical History  Procedure Laterality Date  . Abdominal hysterectomy    . Wisdom tooth extraction    . Thyroidectomy, partial Right 2005  . Nasal sinus surgery  2007  . Appendectomy    . Excisional hemorrhoidectomy    . Dilation and curettage of uterus    . Tubal ligation    . Breast cyst  excision Right      Facility-administered medications prior to admission  Medication Dose Route Frequency Provider Last Rate Last Dose  . diphenhydrAMINE (BENADRYL) capsule 50 mg  50 mg Oral Q6H PRN Minerva Ends, MD   50 mg at 01/06/12 1458   Prescriptions prior to admission  Medication Sig Dispense Refill  . albuterol (PROVENTIL HFA;VENTOLIN HFA) 108 (90 BASE) MCG/ACT inhaler Inhale 2 puffs into the lungs every 6 (six) hours as needed. For shortness of breath      . albuterol (PROVENTIL) (2.5 MG/3ML) 0.083% nebulizer solution Take 3 mLs (2.5 mg total) by nebulization every 6 (six) hours as needed. For shortness of breath  75 mL  12  . ARIPiprazole (ABILIFY) 2 MG tablet Take 2 mg by mouth daily.      . budesonide-formoterol (SYMBICORT) 160-4.5 MCG/ACT inhaler Inhale 2 puffs into the lungs 2 (two) times daily.        . cetirizine (ZYRTEC) 10 MG tablet Take 1 tablet (10 mg total) by mouth every morning.  30 tablet  11  . diclofenac sodium (VOLTAREN) 1 % GEL Apply 1 application topically 2 (two) times daily as needed. On knees for pain/inflamation       . DULoxetine (CYMBALTA) 60 MG capsule Take 60 mg by mouth at bedtime.       Marland Kitchen ibuprofen (  ADVIL,MOTRIN) 600 MG tablet Take 1 tablet (600 mg total) by mouth every 6 (six) hours as needed.  30 tablet  0  . metoprolol-hydrochlorothiazide (LOPRESSOR HCT) 50-25 MG per tablet Take 1 tablet by mouth daily.      . Olopatadine HCl 0.6 % SOLN Place 1 puff into the nose at bedtime.       . ranitidine (ZANTAC) 300 MG tablet Take 300 mg by mouth at bedtime.       . rivaroxaban (XARELTO) 20 MG TABS tablet Take 1 tablet (20 mg total) by mouth daily with supper.  30 tablet  3  . traZODone (DESYREL) 50 MG tablet Take 50 mg by mouth at bedtime as needed for sleep.       . verapamil (CALAN) 80 MG tablet Take 1 tablet (80 mg total) by mouth 3 (three) times daily.  90 tablet  0  . amoxicillin-clavulanate (AUGMENTIN) 875-125 MG per tablet Take 1 tablet by mouth 2  (two) times daily.      Marland Kitchen azelastine (OPTIVAR) 0.05 % ophthalmic solution Place 1 drop into both eyes 2 (two) times daily as needed. For dry/irritated eyes       . dexlansoprazole (DEXILANT) 60 MG capsule Take 60 mg by mouth daily.        Inpatient Medications:  . ARIPiprazole  2 mg Oral QPM  . budesonide-formoterol  2 puff Inhalation BID  . clindamycin (CLEOCIN) IV  600 mg Intravenous 3 times per day  . diltiazem (CARDIZEM) infusion  5 mg/hr Intravenous Once  . DULoxetine  60 mg Oral QHS  . famotidine  20 mg Oral BID  . [START ON 02/01/2014] furosemide  20 mg Oral Daily  . loratadine  10 mg Oral Daily  . metoprolol tartrate  25 mg Oral TID  . Olopatadine HCl  1 puff Nasal QHS  . pantoprazole  40 mg Oral Daily  . rivaroxaban  20 mg Oral Q supper  . sodium chloride  3 mL Intravenous Q12H    Allergies:  Allergies  Allergen Reactions  . Caffeine Other (See Comments)    Migraine  . Ciprofloxacin Hives  . Lisinopril Cough  . Vicodin [Hydrocodone-Acetaminophen] Nausea And Vomiting and Other (See Comments)    Headache   . Erythromycin Rash  . Oxycodone Nausea And Vomiting    headache  . Sulfamethoxazole-Trimethoprim Rash    History   Social History  . Marital Status: Single    Spouse Name: N/A    Number of Children: N/A  . Years of Education: N/A   Occupational History  . Not on file.   Social History Main Topics  . Smoking status: Former Smoker -- 0.25 packs/day for 20 years    Types: Cigarettes    Quit date: 07/17/1999  . Smokeless tobacco: Never Used  . Alcohol Use: Yes     Comment: "drank some in my 30's"  . Drug Use: No  . Sexual Activity: No   Other Topics Concern  . Not on file   Social History Narrative  . No narrative on file     Family History  Problem Relation Age of Onset  . Osteoarthritis Mother   . Asthma Mother   . Heart failure Mother     BP 135/100  Pulse 145  Temp(Src) 98.2 F (36.8 C) (Oral)  Resp 22  SpO2 97%  Physical  Exam: obese appearing 58 yo woman, NAD HEENT: Unremarkable,[place], AT Neck:  8 JVD, no thyromegally Back:  No CVA tenderness Lungs:  Clear with no wheezes, rales, or rhonchi HEART:  Regular tachy rhythm, no murmurs, no rubs, no clicks Abd:  soft, positive bowel sounds, no organomegally, no rebound, no guarding Ext:  2 plus pulses, no edema, no cyanosis, no clubbing Skin:  No rashes no nodules Neuro:  CN II through XII intact, motor grossly intact   Labs:   Lab Results  Component Value Date   WBC 17.9* 01/31/2014   HGB 12.7 01/31/2014   HCT 39.2 01/31/2014   MCV 92.0 01/31/2014   PLT 429* 01/31/2014     Recent Labs Lab 01/31/14 0516  NA 139  K 2.8*  CL 96  CO2 27  BUN 8  CREATININE 0.60  CALCIUM 9.0  PROT 8.1  BILITOT 0.6  ALKPHOS 93  ALT 14  AST 16  GLUCOSE 126*    Radiology/Studies: Ct Angio Chest Pe W/cm &/or Wo Cm 01/31/2014   CLINICAL DATA:  Shortness of breath, tachycardia.  EXAM: CT ANGIOGRAPHY CHEST WITH CONTRAST  TECHNIQUE: Multidetector CT imaging of the chest was performed using the standard protocol during bolus administration of intravenous contrast. Multiplanar CT image reconstructions and MIPs were obtained to evaluate the vascular anatomy.  CONTRAST:  86mL OMNIPAQUE IOHEXOL 350 MG/ML SOLN  COMPARISON:  12/27/2013  FINDINGS: Previously seen left upper lobe and right lower lobe pulmonary emboli have resolved. No acute pulmonary embolus. Heart is normal size. Aorta is normal caliber. No mediastinal, hilar, or axillary adenopathy. Chest wall soft tissues are unremarkable.  Predominately linear densities noted in the lower lobes bilaterally most compatible with atelectasis. No pleural effusions.  Imaging into the upper abdomen shows no acute findings.  Review of the MIP images confirms the above findings.  IMPRESSION: No evidence of pulmonary embolus.  Bilateral lower lobe atelectasis.   Electronically Signed   By: Rolm Baptise M.D.   On: 01/31/2014 12:22   Dg Chest Port 1 View  01/31/2014   CLINICAL DATA:  Shortness of breath  EXAM: PORTABLE CHEST - 1 VIEW  COMPARISON:  04/06/2013  FINDINGS: No cardiomegaly when considering technique. Chronic mild aortic tortuosity. Chronic elevation of the right diaphragm.  There is no edema, consolidation, effusion, or pneumothorax.  IMPRESSION: Stable exam.   No active disease.   Electronically Signed   By: Jorje Guild M.D.   On: 01/31/2014 05:45    IRS:WNIOE RP tachycardia, rate 155, narrow QRS  TELEMETRY: short RP tachycardia, rate 150's  A/P 1. Short RP tachycardia 2. Bronchitis/sinusitus. 3. HTN 4. Morbid obesity Rec: I have discussed treatment options with the patient and reviewed catheter ablation. Hopefully her SVT will resolve once her bronchitis has improved. If not would consider catheter ablation. Will follow.

## 2014-01-31 NOTE — H&P (Addendum)
FMTS ATTENDING ADMISSION NOTE Cyprian Gongaware,MD I  have seen and examined this patient, reviewed their chart. I have discussed this patient with the resident. I agree with the resident's findings, assessment and care plan.  58 Y/O F with pmx obesity,HTN,asthma,hypothyroidism, PE presented to the ED with hx of SOB and cough on going for the last 1 wk gradually worsening. She stated this is typical of her asthma which is often worse in the summer season, she uses Symbicort and Albuterol at home. She has been using her rescue inhaler more than usual for the last few days with no improvement of her breathing. There is associated chest tightness and wheezing. No fever,no sick contact however she was at the urgent care center 3 days ago for throat and ear pain for which she was sent home on A/B regimen. Her throat pain persisted since then. In the last few days she has been having palpitations as well.  No current facility-administered medications on file prior to encounter.   Current Outpatient Prescriptions on File Prior to Encounter  Medication Sig Dispense Refill  . albuterol (PROVENTIL HFA;VENTOLIN HFA) 108 (90 BASE) MCG/ACT inhaler Inhale 2 puffs into the lungs every 6 (six) hours as needed. For shortness of breath      . albuterol (PROVENTIL) (2.5 MG/3ML) 0.083% nebulizer solution Take 3 mLs (2.5 mg total) by nebulization every 6 (six) hours as needed. For shortness of breath  75 mL  12  . ARIPiprazole (ABILIFY) 2 MG tablet Take 2 mg by mouth daily.      . budesonide-formoterol (SYMBICORT) 160-4.5 MCG/ACT inhaler Inhale 2 puffs into the lungs 2 (two) times daily.        . cetirizine (ZYRTEC) 10 MG tablet Take 1 tablet (10 mg total) by mouth every morning.  30 tablet  11  . diclofenac sodium (VOLTAREN) 1 % GEL Apply 1 application topically 2 (two) times daily as needed. On knees for pain/inflamation       . DULoxetine (CYMBALTA) 60 MG capsule Take 60 mg by mouth at bedtime.       Marland Kitchen ibuprofen  (ADVIL,MOTRIN) 600 MG tablet Take 1 tablet (600 mg total) by mouth every 6 (six) hours as needed.  30 tablet  0  . metoprolol-hydrochlorothiazide (LOPRESSOR HCT) 50-25 MG per tablet Take 1 tablet by mouth daily.      . Olopatadine HCl 0.6 % SOLN Place 1 puff into the nose at bedtime.       . ranitidine (ZANTAC) 300 MG tablet Take 300 mg by mouth at bedtime.       . rivaroxaban (XARELTO) 20 MG TABS tablet Take 1 tablet (20 mg total) by mouth daily with supper.  30 tablet  3  . traZODone (DESYREL) 50 MG tablet Take 50 mg by mouth at bedtime as needed for sleep.       . verapamil (CALAN) 80 MG tablet Take 1 tablet (80 mg total) by mouth 3 (three) times daily.  90 tablet  0  . azelastine (OPTIVAR) 0.05 % ophthalmic solution Place 1 drop into both eyes 2 (two) times daily as needed. For dry/irritated eyes       . dexlansoprazole (DEXILANT) 60 MG capsule Take 60 mg by mouth daily.       Past Medical History  Diagnosis Date  . Asthma   . Hypertension   . GERD (gastroesophageal reflux disease)   . Anxiety   . Depression   . Family history of anesthesia complication     "daughter;  causes her to pass out afterwards"  . Pulmonary embolism 12/28/2013    "2 clots in each lung"  . Hyperthyroidism   . Migraine     "monthly" (12/28/2013)  . Osteoarthritis     "both knees; back of my neck; right pelvic bone" (12/28/2013)  . Chronic lower back pain    Filed Vitals:   01/31/14 0730 01/31/14 0800 01/31/14 0830 01/31/14 0959  BP: 114/63 130/76 118/69 135/100  Pulse: 72 142 150 145  Temp:    98.2 F (36.8 C)  TempSrc:    Oral  Resp: 18 22 26 22   SpO2: 99% 92% 97% 97%   Exam: Gen: Awake and alert, calm in bed, not in distress. HEENT: Ear canal clear b/l, no inflammation, no discharge. No TM abnormality. Oropharynx slightly erythematous but she also just took pink lozenges. Resp: Air entry reduced by clear B/L ( S/P albuterol treatment in the ED). Heart: S1 S2 normal, no murmur. [place] rate, rhythm seem  slightly irregular.  Abd: Soft, NT/ND, BS+ and normal. Ext: No edema.  A/P: 58 Y/O F with 1. Dyspnea: Asthma exacerbation vs CHF exacerbation.R/O worsening PE     ProBNP of 1316.     Chest xray reviewed no signs of acute infection although wbc is elevated, uncertain if she was given steroid at the ED.      CTA chest repeated to r/o worsening PE, this was negative.      Albuterol prn, O2 supplement as needed.      Symbicort.      Monitor for improvement.      For her CHF does not seem to be fluid overload.      Monitor I& Os and daily weighing.      Consider lasix.  2. Tachycardia: Afib/SVT: Looks more like Afib.     Cardizem given in the ED. Now on Metoprolol.     Monitor cardiac rhythm on telemetry.     Cardiology consult.      Repeat EKG in AM>  3. Pharyngitis: Strep test negative.     ?? Ongoing infection as wbc is elevated.     ENT consulted.     Continue Clindamycin.  4. Hypokalemia: Might be contributing to cardiac dysrhythmia.     Replace electrolyte.     Recheck BMET.       5. PE: Currently on Xarelto.     Repeat CTA negative.

## 2014-01-31 NOTE — ED Notes (Signed)
Placed bedside commode at bedside for pt use

## 2014-01-31 NOTE — ED Notes (Signed)
Dr Ashok Cordia notified of potassium 2.8

## 2014-02-01 LAB — BASIC METABOLIC PANEL
Anion gap: 15 (ref 5–15)
BUN: 9 mg/dL (ref 6–23)
CALCIUM: 9.1 mg/dL (ref 8.4–10.5)
CO2: 26 mEq/L (ref 19–32)
Chloride: 100 mEq/L (ref 96–112)
Creatinine, Ser: 0.51 mg/dL (ref 0.50–1.10)
GFR calc Af Amer: >90 mL/min (ref >90)
GFR calc non Af Amer: >90 mL/min (ref >90)
GLUCOSE: 123 mg/dL — AB (ref 70–99)
POTASSIUM: 3.2 meq/L — AB (ref 3.7–5.3)
Sodium: 141 mEq/L (ref 137–147)

## 2014-02-01 LAB — CBC
HCT: 38.1 % (ref 36.0–46.0)
HEMOGLOBIN: 12.1 g/dL (ref 12.0–15.0)
MCH: 29.4 pg (ref 26.0–34.0)
MCHC: 31.8 g/dL (ref 30.0–36.0)
MCV: 92.7 fL (ref 78.0–100.0)
Platelets: 447 10*3/uL — ABNORMAL HIGH (ref 150–400)
RBC: 4.11 MIL/uL (ref 3.87–5.11)
RDW: 16.1 % — ABNORMAL HIGH (ref 11.5–15.5)
WBC: 14.6 10*3/uL — AB (ref 4.0–10.5)

## 2014-02-01 LAB — TSH: TSH: 0.478 u[IU]/mL (ref 0.350–4.500)

## 2014-02-01 LAB — TROPONIN I: Troponin I: <0.3 ng/mL (ref <0.30)

## 2014-02-01 MED ORDER — ALBUTEROL SULFATE (2.5 MG/3ML) 0.083% IN NEBU
INHALATION_SOLUTION | RESPIRATORY_TRACT | Status: AC
  Administered 2014-02-01: 2.5 mg
  Filled 2014-02-01: qty 3

## 2014-02-01 MED ORDER — CLINDAMYCIN HCL 300 MG PO CAPS
600.0000 mg | ORAL_CAPSULE | Freq: Three times a day (TID) | ORAL | Status: DC
  Administered 2014-02-01 – 2014-02-04 (×10): 600 mg via ORAL
  Filled 2014-02-01 (×14): qty 2

## 2014-02-01 MED ORDER — DILTIAZEM HCL ER COATED BEADS 180 MG PO CP24
180.0000 mg | ORAL_CAPSULE | Freq: Every day | ORAL | Status: DC
  Administered 2014-02-01 – 2014-02-02 (×2): 180 mg via ORAL
  Filled 2014-02-01 (×3): qty 1

## 2014-02-01 MED ORDER — ALBUTEROL SULFATE (2.5 MG/3ML) 0.083% IN NEBU
2.5000 mg | INHALATION_SOLUTION | RESPIRATORY_TRACT | Status: DC | PRN
  Administered 2014-02-01 – 2014-02-02 (×3): 2.5 mg via RESPIRATORY_TRACT
  Filled 2014-02-01 (×5): qty 3

## 2014-02-01 MED ORDER — POTASSIUM CHLORIDE CRYS ER 20 MEQ PO TBCR
20.0000 meq | EXTENDED_RELEASE_TABLET | Freq: Two times a day (BID) | ORAL | Status: DC
  Administered 2014-02-01 – 2014-02-02 (×3): 20 meq via ORAL
  Filled 2014-02-01 (×4): qty 1

## 2014-02-01 NOTE — Progress Notes (Signed)
Patient ID: [name] White, female   DOB: Sep 18, 1955, 58 y.o.   MRN: [id number]   Patient Name: [name] White Date of Encounter: 02/01/2014     Principal Problem:   Narrow complex tachycardia Active Problems:   DEPRESSIVE DISORDER, NOS   ASTHMA, PERSISTENT   Pulmonary embolism   Sustained SVT    SUBJECTIVE  "I feel better"  CURRENT MEDS . ARIPiprazole  2 mg Oral QPM  . budesonide-formoterol  2 puff Inhalation BID  . clindamycin (CLEOCIN) IV  600 mg Intravenous 3 times per day  . DULoxetine  60 mg Oral QHS  . famotidine  20 mg Oral BID  . furosemide  20 mg Oral Daily  . guaiFENesin  600 mg Oral BID  . loratadine  10 mg Oral Daily  . metoprolol tartrate  25 mg Oral TID  . Olopatadine HCl  1 puff Nasal QHS  . pantoprazole  40 mg Oral Daily  . potassium chloride  20 mEq Oral BID  . rivaroxaban  20 mg Oral Q supper  . sodium chloride  3 mL Intravenous Q12H    OBJECTIVE  Filed Vitals:   02/01/14 0055 02/01/14 0300 02/01/14 0815 02/01/14 0944  BP:  131/78  129/76  Pulse: 74 80 82   Temp:  97.6 F (36.4 C)    TempSrc:  Oral    Resp: 16 18 16    SpO2:  99% 98%     Intake/Output Summary (Last 24 hours) at 02/01/14 1003 Last data filed at 02/01/14 0700  Gross per 24 hour  Intake    840 ml  Output      0 ml  Net    840 ml   There were no vitals filed for this visit.  PHYSICAL EXAM  General: Pleasant, NAD. Neuro: Alert and oriented X 3. Moves all extremities spontaneously. HEENT:  Normal  Neck: Supple without bruits or JVD. Lungs:  Resp regular and unlabored, CTA. Heart: RRR no s3, s4, or murmurs. Abdomen: Soft, non-tender, non-distended, BS + x 4.  Extremities: No clubbing, cyanosis or edema. DP/PT/Radials 2+ and equal bilaterally.  Accessory Clinical Findings  CBC  Recent Labs  01/31/14 0516 02/01/14 0331  WBC 17.9* 14.6*  HGB 12.7 12.1  HCT 39.2 38.1  MCV 92.0 92.7  PLT 429* 941*   Basic Metabolic Panel  Recent Labs  01/31/14 0516  01/31/14 1707 02/01/14 0331  NA 139 138 141  K 2.8* 3.9 3.2*  CL 96 100 100  CO2 27 22 26   GLUCOSE 126* 152* 123*  BUN 8 6 9   CREATININE 0.60 0.47* 0.51  CALCIUM 9.0 9.1 9.1  MG 1.7  --   --    Liver Function Tests  Recent Labs  01/31/14 0516  AST 16  ALT 14  ALKPHOS 93  BILITOT 0.6  PROT 8.1  ALBUMIN 3.2*   No results found for this basename: LIPASE, AMYLASE,  in the last 72 hours Cardiac Enzymes  Recent Labs  01/31/14 1142 01/31/14 1707 01/31/14 2308  TROPONINI <0.30 <0.30 <0.30   BNP No components found with this basename: POCBNP,  D-Dimer No results found for this basename: DDIMER,  in the last 72 hours Hemoglobin A1C No results found for this basename: HGBA1C,  in the last 72 hours Fasting Lipid Panel No results found for this basename: CHOL, HDL, LDLCALC, TRIG, CHOLHDL, LDLDIRECT,  in the last 72 hours Thyroid Function Tests  Recent Labs  02/01/14 0331  TSH 0.478    TELE NSR  Radiology/Studies  Ct Angio Chest Pe W/cm &/or Wo Cm  01/31/2014   CLINICAL DATA:  Shortness of breath, tachycardia.  EXAM: CT ANGIOGRAPHY CHEST WITH CONTRAST  TECHNIQUE: Multidetector CT imaging of the chest was performed using the standard protocol during bolus administration of intravenous contrast. Multiplanar CT image reconstructions and MIPs were obtained to evaluate the vascular anatomy.  CONTRAST:  40mL OMNIPAQUE IOHEXOL 350 MG/ML SOLN  COMPARISON:  12/27/2013  FINDINGS: Previously seen left upper lobe and right lower lobe pulmonary emboli have resolved. No acute pulmonary embolus. Heart is normal size. Aorta is normal caliber. No mediastinal, hilar, or axillary adenopathy. Chest wall soft tissues are unremarkable.  Predominately linear densities noted in the lower lobes bilaterally most compatible with atelectasis. No pleural effusions.  Imaging into the upper abdomen shows no acute findings.  Review of the MIP images confirms the above findings.  IMPRESSION: No evidence of  pulmonary embolus.  Bilateral lower lobe atelectasis.   Electronically Signed   By: Rolm Baptise M.D.   On: 01/31/2014 12:22   Dg Chest Port 1 View  01/31/2014   CLINICAL DATA:  Shortness of breath  EXAM: PORTABLE CHEST - 1 VIEW  COMPARISON:  04/06/2013  FINDINGS: No cardiomegaly when considering technique. Chronic mild aortic tortuosity. Chronic elevation of the right diaphragm.  There is no edema, consolidation, effusion, or pneumothorax.  IMPRESSION: Stable exam.   No active disease.   Electronically Signed   By: Jorje Guild M.D.   On: 01/31/2014 05:45    ASSESSMENT AND PLAN 1. Incessant SVT, resolved 2. Bronchitis - improved on anti-biotics Rec: At this point, no plan for catheter ablation. Continue her current meds. [place] for discharge from arrhythmia perspective. Continue metoprolol. I would like to see her back in 4-6 weeks in the office.  Gregg Taylor,M.D.  02/01/2014 10:03 AM

## 2014-02-01 NOTE — Progress Notes (Signed)
FMTS ATTENDING  NOTE Kehinde Eniola,MD I  have seen and examined this patient, reviewed their chart. I have discussed this patient with the resident. I agree with the resident's findings, assessment and care plan. Patient's symptoms improved, denies any sob or palpitations this morning, does still have mild sore throat. Heart rate and rhythm now wnl. She reverted back to sinus.Still on Cardizem drip,s/p cardiology assessment considering possible ablation, now that she is back in sinus unclear if she will still be needing ablation.Continue telemetry monitoring,monitor vitals as well.

## 2014-02-01 NOTE — Progress Notes (Signed)
Family Medicine Teaching Service Daily Progress Note Intern Pager: [phone]  Patient name: [name] White Medical record number: [id number] Date of birth: 09-16-1955 Age: 58 y.o. Gender: female  Primary Care Provider: Howard Pouch, DO Consultants: cardiology, EP Code Status: FULL  Pt Overview and Major Events to Date:  [name] White is a 58 y.o. female presenting with multiple complaints including sore throat, chest congestion/tightness, and palptitaions. PMH is significant for persistent asthma vs COPD in former smoker, depression, HTN, hypothyroidism, LE edema, obesity, bilateral knee OA, recent dx with multiple PEs 12/2013.   8/7: admitted, on diltiazem drip with improvement in heart rate from 150's to 80's; CTA negative for new PE 8/8: awaiting echocardiogram for possible addition of flecainide; final cariology / EP recs pending echo  Assessment and Plan: # Tachycardia - uncertain etiology, though expect SVT +/- paroxysmal afib with RVR improved on diltiazem drip; initially rate 150-160s, narrow complex, with EKG showing regular rhythm though p waves difficult to find and QTc 488. Afebrile, troponins negative - repeat EKG with normal sinus rhythm and rate 80's - cardiology and EP consulted, recommending echo and possibly flecainide and/or ablation procedure once pharyngitis is cleared - continue telemetry monitoring - follow up echocardiogram and cardiology / EP recs; appreciate assistance with management - Continue cardizem IV, transition to PO depending on cards recs  # Dyspnea - improving, likely related to tachycardia (see above), but DDx includes asthma exacerbation, heart failure (increased BNP 1316, no prior to compare); worsened PE unlikely given no new PE on CTA - somewhat improved s/p breathing treatment 8/7, and with improvement in tachycardia, as above - continue albuterol PRN and home Symbicort  - continue O2 as needed, wean to room air - considering sleep study  outpatient recommended with likely OHS/OSA component   #Recent PE - no new PE on repeat CTA; continue Xarelto - considering hypercoag workup as outpatient  # Throat pain -apparent cellulitis s/p consult by ENT in the ED, with subjective fevers PTA and WBC 17.9 on admission - WBC trending down as of 8/8,  strep culture neg 8/5, rapid strep 8/7 negative, repeat culture pending - transition to clindamycin 600mg  q8 hours PO; 8/8 is day 2, anticipate 7-10 day course - Continuous pulse oximetry - F/u with ENT outpatient, but will reconsult inpt if necessary  # Hypokalemia - K 2.8 on admission, improved on recheck; 3.2 8/8 AM - Repleting K as needed  - recheck BMP as needed  #Depression - continuing home Abilify, Cymbalta, trazodone  #Allergies - continuing home antihistamine, eye drops,   #GERD - continue home PPI, Pepcid (Protonix per formulary)  #Hx of hypothyroidism - not on Synthroid at home, TSH 0.478 normal 8/8  FEN/GI: full liquid diet, advance as tolerated; KVO fluids PPx: on Xarelto, Protonix as above  Disposition: management as above; discharge planning pending transition to PO meds and final cardiology recommendations  Subjective:  Pt reports she feels some better, though her throat is still sore. She is not as tired and is tolerating PO intake. She is not SOB and has no chest pain. She has no LE swelling and is not nauseated. Overall she is glad she's "moving in the right direction."  Objective: Temp:  [97.6 F (36.4 C)-98.4 F (36.9 C)] 97.6 F (36.4 C) (08/08 0300) Pulse Rate:  [74-84] 82 (08/08 0815) Resp:  [16-20] 16 (08/08 0815) BP: (115-131)/(72-78) 129/76 mmHg (08/08 0944) SpO2:  [95 %-99 %] 98 % (08/08 0815) Physical Exam: General: NAD, morbidly obese female  lying in bed, sits up without assistance HEENT: [name]/AT, EOMI, no stridor, neck supple, no appreciable anterior or posterior cervical LAD Cardiovascular: apparent RRR, II/VI systolic flow murmur  unchange Respiratory: decreased air movement throughout but no frank wheezes noted, on RA Abdomen: soft, nontender, BS+; exam-limiting obesity  Extremities: No LE edema or calf tenderness, no asymmetry  Skin: No rash or cyanosis  Neuro: Awake, alert, normal speech, moves all extremities equally  Laboratory:  Recent Labs Lab 01/31/14 0516 02/01/14 0331  WBC 17.9* 14.6*  HGB 12.7 12.1  HCT 39.2 38.1  PLT 429* 447*    Recent Labs Lab 01/31/14 0516 01/31/14 1707 02/01/14 0331  NA 139 138 141  K 2.8* 3.9 3.2*  CL 96 100 100  CO2 27 22 26   BUN 8 6 9   CREATININE 0.60 0.47* 0.51  CALCIUM 9.0 9.1 9.1  PROT 8.1  --   --   BILITOT 0.6  --   --   ALKPHOS 93  --   --   ALT 14  --   --   AST 16  --   --   GLUCOSE 126* 152* 123*    Recent Labs Lab 01/31/14 0516 01/31/14 1142 01/31/14 1707 01/31/14 2308  TROPONINI <0.30 <0.30 <0.30 <0.30   BNP 1316  Lactic acid 1.2   Imaging/Diagnostic Tests:  ED EKG: Narrow complex tachycardia, rate 155, unable to identify P waves Repeat EKG 8/7: NSR, rate 81 (on diltiazem drip)  CXR 8/7: No active cardiopulmonary disease. CTA Chest 8/7: bilateral lower lobe atelectasis but no new PE Echocardiogram: PENDING  Emmaline Kluver, MD 02/01/2014, 10:03 AM PGY-3, [place] Intern pager: [phone], text pages welcome

## 2014-02-02 DIAGNOSIS — I059 Rheumatic mitral valve disease, unspecified: Secondary | ICD-10-CM

## 2014-02-02 LAB — BASIC METABOLIC PANEL
Anion gap: 15 (ref 5–15)
BUN: 10 mg/dL (ref 6–23)
CHLORIDE: 97 meq/L (ref 96–112)
CO2: 25 meq/L (ref 19–32)
Calcium: 8.8 mg/dL (ref 8.4–10.5)
Creatinine, Ser: 0.52 mg/dL (ref 0.50–1.10)
GFR calc Af Amer: >90 mL/min (ref >90)
GFR calc non Af Amer: >90 mL/min (ref >90)
GLUCOSE: 133 mg/dL — AB (ref 70–99)
POTASSIUM: 3.2 meq/L — AB (ref 3.7–5.3)
SODIUM: 137 meq/L (ref 137–147)

## 2014-02-02 LAB — CULTURE, GROUP A STREP

## 2014-02-02 LAB — CBC
HCT: 38.5 % (ref 36.0–46.0)
HEMOGLOBIN: 12.2 g/dL (ref 12.0–15.0)
MCH: 29.8 pg (ref 26.0–34.0)
MCHC: 31.7 g/dL (ref 30.0–36.0)
MCV: 94.1 fL (ref 78.0–100.0)
Platelets: 436 10*3/uL — ABNORMAL HIGH (ref 150–400)
RBC: 4.09 MIL/uL (ref 3.87–5.11)
RDW: 16.2 % — ABNORMAL HIGH (ref 11.5–15.5)
WBC: 10 10*3/uL (ref 4.0–10.5)

## 2014-02-02 LAB — MAGNESIUM: Magnesium: 1.7 mg/dL (ref 1.5–2.5)

## 2014-02-02 MED ORDER — FLECAINIDE ACETATE 50 MG PO TABS: 50.0000 mg | ORAL_TABLET | Freq: Two times a day (BID) | ORAL | Status: DC

## 2014-02-02 MED ORDER — FLECAINIDE ACETATE 50 MG PO TABS
50.0000 mg | ORAL_TABLET | Freq: Two times a day (BID) | ORAL | Status: DC
  Administered 2014-02-02 – 2014-02-04 (×4): 50 mg via ORAL
  Filled 2014-02-02 (×6): qty 1

## 2014-02-02 MED ORDER — POTASSIUM CHLORIDE CRYS ER 20 MEQ PO TBCR
40.0000 meq | EXTENDED_RELEASE_TABLET | Freq: Once | ORAL | Status: AC
  Administered 2014-02-02: 40 meq via ORAL
  Filled 2014-02-02: qty 2

## 2014-02-02 MED ORDER — POTASSIUM CHLORIDE CRYS ER 20 MEQ PO TBCR
20.0000 meq | EXTENDED_RELEASE_TABLET | Freq: Two times a day (BID) | ORAL | Status: DC
Start: 2014-02-03 — End: 2014-02-04
  Administered 2014-02-03 (×2): 20 meq via ORAL
  Filled 2014-02-02 (×4): qty 1

## 2014-02-02 MED ORDER — FLECAINIDE ACETATE 50 MG PO TABS
50.0000 mg | ORAL_TABLET | Freq: Two times a day (BID) | ORAL | Status: DC
  Filled 2014-02-02: qty 1

## 2014-02-02 NOTE — Progress Notes (Signed)
*  PRELIMINARY RESULTS* Echocardiogram 2D Echocardiogram has been performed.  Leavy Cella 02/02/2014, 9:58 AM

## 2014-02-02 NOTE — Progress Notes (Signed)
02/02/2014 1400 Nursing note Pt. Upset about possibly not being discharged today. HR elevated to 130-160 sustaining ST. PT. Asymptomatic. VS collected. MD notified. Metoprolol PO given early again per verbal orders. PO Cardizem already given. Confirmed with Central Telemetry rhythm was Sinus Tach. Pt. Updated on plan of care. Upon reassessment, HR down to 120 sustaining. MD aware. Will continue to monitor. Terisha Losasso, Arville Lime

## 2014-02-02 NOTE — Progress Notes (Signed)
02/02/2014 8:39 AM Nursing note Pt. Noted to have several bursts of ST rate 130-160 this am, non-sustaining, immediately returning to SR 80's. PT.resting in bed, asymptomatic. Unable to catch rhythm with EKG due to fast transition with bursts lasting about 1-2 min. MD paged and made aware. AM metoprolol PO given early per orders. MD to see patient. Will continue to closely monitor rate and rhythm.  Lakaisha Danish, Arville Lime

## 2014-02-02 NOTE — Progress Notes (Signed)
02/02/2014 5:46 PM Nursing note EKG obtained. MD on call paged and made aware of findings. No new orders at this time only to continue to monitor patient.  [name], Arville Lime

## 2014-02-02 NOTE — Progress Notes (Signed)
Patient's heart rate in the 150-160's sustained. No distress noted, prn lopressor given hr now 90  And no chest pain

## 2014-02-02 NOTE — Progress Notes (Signed)
02/02/2014 4:57 PM Nursing note Pt. HR continues to remain ST 135-140 resting in bed despite PRN iv metoprolol, Scheduled metoprolol po, po Cardizem and PO flecainide. Pt. Remains asymptomatic. BP 131/75.  MD on call paged and made aware. MD to contact cardiology. Will continue to closely monitor patient.  [name] White, Arville Lime

## 2014-02-02 NOTE — Progress Notes (Signed)
Patient c/o tightness and SOB, called respiratory regarding need for breathing treatment. Heart rate was in the 80's . Patient stated Breathing treatment helped. Then tele called pt heart rate up in the 130's- 140's. Patient was up to bathroom , no chest pain and no sob. Prn lopressor given per order. Heart rate after activity and medication is now 78. VSS will cont. To monitor

## 2014-02-02 NOTE — Progress Notes (Signed)
Patient [name] White 3.2 and bursts of tachycardia. I paged on call family practice md, ekg ordered. Pt heart rate at this time 80. No pain

## 2014-02-02 NOTE — Progress Notes (Signed)
Family Medicine Teaching Service Daily Progress Note Intern Pager: [phone]  Patient name: [name] White Medical record number: [id number] Date of birth: 1955-07-12 Age: 58 y.o. Gender: female  Primary Care Provider: Howard Pouch, DO Consultants: cardiology, EP Code Status: FULL  Pt Overview and Major Events to Date:  [name] White is a 58 y.o. female presenting with multiple complaints including sore throat, chest congestion/tightness, and palptitaions. PMH is significant for persistent asthma vs COPD in former smoker, depression, HTN, hypothyroidism, LE edema, obesity, bilateral knee OA, recent dx with multiple PEs 12/2013.   8/7: admitted, on diltiazem drip with improvement in heart rate from 150's to 80's; CTA negative for new PE 8/8: awaiting echocardiogram for possible addition of flecainide; final cariology / EP recs pending echo 8/9: Tachy overnight 130-160's.  SOB overnight w/chest tightness. Breathing treatment and PRN metoprolol given (x2 overnight). Echo: LVEF 60-65%   Assessment and Plan: 1. Tachycardia - uncertain etiology, though expect SVT +/- paroxysmal afib with RVR improved on diltiazem drip; initially rate 150-160s, narrow complex, with EKG showing regular rhythm though p waves difficult to find and QTc 488. Afebrile, troponins negative - repeat EKG 08/09: NSR, HR 82, QTc 425 - cardiology and EP consulted, recommending echo and possibly flecainide and/or ablation procedure once pharyngitis is cleared - continue telemetry monitoring - follow up echocardiogram: LVEF 60-65% - cardiology / EP recs; appreciate assistance with management - Continue po metorpolol 25 tid and IV metoprolol 2.5 PRN hr>130 - Continue PO Cardizem 180 qd - Flecainide 50mg  PO BID started. Cards recommends monitoring at least 12-24 hours.  2. Dyspnea - improving, likely related to tachycardia (see above), but DDx includes asthma exacerbation, heart failure (increased BNP 1316, no prior to  compare); worsened PE unlikely given no new PE on CTA - somewhat improved s/p breathing treatment 8/9, and with improvement in tachycardia, as above - continue albuterol PRN and home Symbicort  - continue O2 as needed on 2L O2, wean to room air - considering sleep study outpatient recommended with likely OHS/OSA component   3. Recent PE - no new PE on repeat CTA; continue Xarelto - considering hypercoag workup as outpatient  4. Throat pain -apparent cellulitis s/p consult by ENT in the ED, with subjective fevers PTA and WBC 17.9 on admission - WBC down to 10.0,  strep culture neg 8/5, rapid strep 8/7 negative, repeat culture Negative for Beta Hemolytic Strep - Day #3 of Clindamycin 600mg  q8 hours PO (anticipate 7-10 day course) - Cepacol lozenge PRN - Continuous pulse oximetry - F/u with ENT outpatient, but will reconsult inpt if necessary  5. Hypokalemia - K 2.8 on admission, improved on recheck; 3.2 (8/9 AM) - Repleting K as needed, 40 Meq tonight, resume scheduled 49meq BID tomorrow  - recheck Mg: 1.7 - recheck BMP as needed  6. Depression - continuing home Abilify, Cymbalta, trazodone  7. Allergies - continuing home antihistamine, eye drops,   8. GERD - continue home PPI, Pepcid (Protonix per formulary)  9. Hx of hypothyroidism - not on Synthroid at home, TSH 0.478 normal 8/8  FEN/GI: full liquid diet, advance as tolerated; KVO fluids PPx: on Xarelto, Protonix as above  Disposition: management as above; discharge potentially tomorrow pending response to flecainide and stabilization of tachycardia.  Subjective:  Patient reports that she feels a lot better.  However, her throat continue to be sore and feels relatively unchanged since admission. She is not as tired and is tolerating PO intake. Patient denies SOB, CP, N/V.  Nurse reports that patient had bursts of tachycardia throughout the night, but she has been unable to capture this on EKG as the tachycardia is so short  lived.  Objective: Temp:  [98 F (36.7 C)-98.1 F (36.7 C)] 98.1 F (36.7 C) (08/09 0444) Pulse Rate:  [62-85] 85 (08/09 0444) Resp:  [16-22] 22 (08/09 0444) BP: (112-139)/(75-91) 139/75 mmHg (08/09 0444) SpO2:  [93 %-100 %] 100 % (08/09 0444) Weight:  [308 lb 3.3 oz (139.8 kg)] 308 lb 3.3 oz (139.8 kg) (08/09 0425) Physical Exam: General: NAD, morbidly obese female sitting up in bed, sits up without assistance HEENT: [place]/AT, EOMI, no stridor, neck supple, no appreciable anterior or posterior cervical LAD Cardiovascular: S1S2 RRR, II/VI systolic flow murmur unchanged Respiratory: globally decreased air movement but no wheezes appreciated, [place] in place Abdomen: soft, nontender, BS+; exam-limiting obesity  Extremities: No LE edema or calf tenderness, no asymmetry  Skin: No rash or cyanosis  Neuro: Awake, alert, normal speech, moves all extremities equally  Laboratory:  Recent Labs Lab 01/31/14 0516 02/01/14 0331 02/02/14 0315  WBC 17.9* 14.6* 10.0  HGB 12.7 12.1 12.2  HCT 39.2 38.1 38.5  PLT 429* 447* 436*    Recent Labs Lab 01/31/14 0516 01/31/14 1707 02/01/14 0331 02/02/14 0315  NA 139 138 141 137  K 2.8* 3.9 3.2* 3.2*  CL 96 100 100 97  CO2 27 22 26 25   BUN 8 6 9 10   CREATININE 0.60 0.47* 0.51 0.52  CALCIUM 9.0 9.1 9.1 8.8  PROT 8.1  --   --   --   BILITOT 0.6  --   --   --   ALKPHOS 93  --   --   --   ALT 14  --   --   --   AST 16  --   --   --   GLUCOSE 126* 152* 123* 133*    Recent Labs Lab 01/31/14 0516 01/31/14 1142 01/31/14 1707 01/31/14 2308  TROPONINI <0.30 <0.30 <0.30 <0.30   BNP (08/07) 1316  Lactic acid (08/07) 1.2  Mg (08/09) 1.7  Imaging/Diagnostic Tests:  ED EKG: Narrow complex tachycardia, rate 155, unable to identify P waves Repeat EKG 8/7: NSR, rate 81 (on diltiazem drip) Repeat EKG 08/09: NSR, rate 82 (cardizem CD 180mg )  CXR 8/7: No active cardiopulmonary disease. CTA Chest 8/7: bilateral lower lobe atelectasis but no new  PE Echo 08/09: EF 60-65%  Janora Norlander, DO 02/02/2014, 6:44 AM PGY-1, [place] Intern pager: [phone], text pages welcome

## 2014-02-02 NOTE — Progress Notes (Signed)
FMTS ATTENDING  NOTE Arabel Barcenas,MD I  have seen and examined this patient, reviewed their chart. I have discussed this patient with the resident. I agree with the resident's findings, assessment and care plan. 

## 2014-02-02 NOTE — Progress Notes (Signed)
02/02/2014 1815 Cardiologist on call on floor to review pt. Chart. No new orders at this time. Only to continue to monitor patient at this time. RN to repeat PRN IV metoprolol at 1830. Will continue to monitor patient.  Alexande Sheerin, Arville Lime

## 2014-02-03 LAB — CBC
HCT: 39.1 % (ref 36.0–46.0)
Hemoglobin: 12.5 g/dL (ref 12.0–15.0)
MCH: 29.3 pg (ref 26.0–34.0)
MCHC: 32 g/dL (ref 30.0–36.0)
MCV: 91.8 fL (ref 78.0–100.0)
Platelets: 458 K/uL — ABNORMAL HIGH (ref 150–400)
RBC: 4.26 MIL/uL (ref 3.87–5.11)
RDW: 15.9 % — ABNORMAL HIGH (ref 11.5–15.5)
WBC: 14.7 K/uL — ABNORMAL HIGH (ref 4.0–10.5)

## 2014-02-03 LAB — BASIC METABOLIC PANEL WITH GFR
Anion gap: 15 (ref 5–15)
BUN: 6 mg/dL (ref 6–23)
CO2: 28 meq/L (ref 19–32)
Calcium: 8.9 mg/dL (ref 8.4–10.5)
Chloride: 96 meq/L (ref 96–112)
Creatinine, Ser: 0.6 mg/dL (ref 0.50–1.10)
GFR calc Af Amer: >90 mL/min (ref >90)
GFR calc non Af Amer: >90 mL/min (ref >90)
Glucose, Bld: 120 mg/dL — ABNORMAL HIGH (ref 70–99)
Potassium: 3.6 meq/L — ABNORMAL LOW (ref 3.7–5.3)
Sodium: 139 meq/L (ref 137–147)

## 2014-02-03 MED ORDER — DILTIAZEM HCL ER COATED BEADS 240 MG PO CP24
240.0000 mg | ORAL_CAPSULE | Freq: Every day | ORAL | Status: DC
  Administered 2014-02-03 – 2014-02-04 (×2): 240 mg via ORAL
  Filled 2014-02-03 (×2): qty 1

## 2014-02-03 NOTE — Progress Notes (Signed)
UR Completed.  Vergie Living [id number] 02/03/2014

## 2014-02-03 NOTE — Care Management Note (Unsigned)
    Page 1 of 1   02/03/2014     2:14:09 PM CARE MANAGEMENT NOTE 02/03/2014  Patient:  [name] White, [name] White   Account Number:  [id number]  Date Initiated:  02/03/2014  Documentation initiated by:  Ivonna Kinnick  Subjective/Objective Assessment:   Pt adm on 01/31/14 with tachycardia, dyspnea, and throat pain.  PTA, pt independent, lives with cousin.     Action/Plan:   Will follow for dc needs as pt progresses.   Anticipated DC Date:  02/05/2014   Anticipated DC Plan:  [place]  CM consult      Choice offered to / List presented to:             Status of service:  In process, will continue to follow Medicare Important Message given?  YES (If response is "NO", the following Medicare IM given date fields will be blank) Date Medicare IM given:  02/03/2014 Medicare IM given by:  Andersyn Fragoso Date Additional Medicare IM given:   Additional Medicare IM given by:    Discharge Disposition:    Per UR Regulation:  Reviewed for med. necessity/level of care/duration of stay  If discussed at [place] of Stay Meetings, dates discussed:    Comments:

## 2014-02-03 NOTE — Progress Notes (Signed)
Family Medicine Teaching Service Daily Progress Note Intern Pager: [phone]  Patient name: [name] White Medical record number: [id number] Date of birth: June 13, 1956 Age: 58 y.o. Gender: female  Primary Care Provider: Howard Pouch, DO Consultants: cardiology, EP Code Status: FULL  Pt Overview and Major Events to Date:  [name] White is a 58 y.o. female presenting with multiple complaints including sore throat, chest congestion/tightness, and palptitaions. PMH is significant for persistent asthma vs COPD in former smoker, depression, HTN, hypothyroidism, LE edema, obesity, bilateral knee OA, recent dx with multiple PEs 12/2013.   8/7: admitted, on diltiazem drip with improvement in heart rate from 150's to 80's; CTA negative for new PE 8/8: awaiting echocardiogram for possible addition of flecainide; final cariology / EP recs pending echo 8/9: Tachy overnight 130-160's.  SOB overnight w/chest tightness. Breathing treatment and PRN metoprolol given (x2 overnight). Echo: LVEF 60-65% 8/10: Patient in and out of tachy throughout the night.  Flecainide added yesterday afternoon.  Assessment and Plan: 1. Tachycardia - uncertain etiology, though expect SVT +/- paroxysmal afib with RVR improved on diltiazem drip; initially rate 150-160s, narrow complex, with EKG showing regular rhythm though p waves difficult to find and QTc 488. Afebrile, troponins negative - 08/10 (see "CV strip"): Sinus Tachycardia HR 160 - cardiology and EP consulted, EP states that there are no plans for ablation at this time. - continue telemetry monitoring - follow up echocardiogram: LVEF 60-65% - cardiology / EP recs; appreciate assistance with management - Continue po metorpolol 25 tid and IV metoprolol 2.5 PRN hr>130, Consider increasing to 5mg  IV up to x3 over 5 minutes for uncontrolled tachycardia per Dr Elias Else. - Cardizem increased to 240 mg qd - Flecainide 50mg  PO BID started yesterday. Continue  monitoring.  2. Dyspnea - improving, likely related to tachycardia (see above), but DDx includes asthma exacerbation, heart failure (increased BNP 1316, no prior to compare); worsened PE unlikely given no new PE on CTA - somewhat improved s/p breathing treatment 8/9, and with improvement in tachycardia, as above - continue albuterol PRN and home Symbicort  - continue O2 as needed on 2L O2, wean to room air - considering sleep study outpatient recommended with likely OHS/OSA component   3. Recent PE - no new PE on repeat CTA; continue Xarelto - considering hypercoag workup as outpatient  4. Throat pain -apparent cellulitis s/p consult by ENT in the ED, with subjective fevers PTA and WBC 17.9 on admission,  strep culture neg 8/5, rapid strep 8/7 negative, repeat culture Negative for Beta Hemolytic Strep - WBC up to 14.7  ?De-marginization reaction to sustained tachy? Vs infection of throat - Day #4 of Clindamycin 600mg  q8 hours PO (anticipate 10 day course) - Cepacol lozenge PRN - Continuous pulse oximetry - F/u with ENT outpatient, but will reconsult inpt if necessary  5. Hypokalemia - K 2.8 on admission, improving: 3.6 (8/10 AM) - Repleting K as needed, 40 meq given last night, resume scheduled 38meq BID today - recheck Mg as needed - recheck BMP as needed  6. Depression - continuing home Abilify, Cymbalta, trazodone  7. Allergies - continuing home antihistamine, eye drops,   8. GERD - continue home PPI, Pepcid (Protonix per formulary)  9. Hx of hypothyroidism - not on Synthroid at home, TSH 0.478 normal 8/8  FEN/GI: full liquid diet, advance as tolerated; KVO fluids PPx: on Xarelto, Protonix as above  Disposition: management as above; discharge potentially tomorrow pending response to flecainide and stabilization of tachycardia.  Subjective:  Patient reports that sore throat is improving.  She continues to be tired from yesterday.  Patient is tolerating PO intake. Patient denies  SOB, CP, N/V.   Objective: Temp:  [98.7 F (37.1 C)-99.4 F (37.4 C)] 99.2 F (37.3 C) (08/10 0559) Pulse Rate:  [78-161] 151 (08/10 0559) Resp:  [20-22] 22 (08/10 0559) BP: (115-131)/(58-88) 125/58 mmHg (08/10 0559) SpO2:  [95 %-98 %] 95 % (08/10 0559) Weight:  [300 lb 7.8 oz (136.3 kg)] 300 lb 7.8 oz (136.3 kg) (08/10 0559) Physical Exam: General: NAD, morbidly obese female lying in bed, sits up without assistance HEENT: [place]/AT, EOMI, no stridor, neck supple, no appreciable anterior or posterior cervical LAD Cardiovascular: S1S2 RRR, II/VI systolic flow murmur unchanged Respiratory: globally decreased air movement but no wheezes appreciated Abdomen: soft, nontender, BS+; exam-limiting obesity  Extremities: No LE edema or calf tenderness, no asymmetry  Skin: No rash or cyanosis  Neuro: Sleepy but responds appropriately to questions/ commands, normal speech, moves all extremities equally  Laboratory:  Recent Labs Lab 02/01/14 0331 02/02/14 0315 02/03/14 0526  WBC 14.6* 10.0 14.7*  HGB 12.1 12.2 12.5  HCT 38.1 38.5 39.1  PLT 447* 436* 458*    Recent Labs Lab 01/31/14 0516  02/01/14 0331 02/02/14 0315 02/03/14 0526  NA 139  < > 141 137 139  K 2.8*  < > 3.2* 3.2* 3.6*  CL 96  < > 100 97 96  CO2 27  < > 26 25 28   BUN 8  < > 9 10 6   CREATININE 0.60  < > 0.51 0.52 0.60  CALCIUM 9.0  < > 9.1 8.8 8.9  PROT 8.1  --   --   --   --   BILITOT 0.6  --   --   --   --   ALKPHOS 93  --   --   --   --   ALT 14  --   --   --   --   AST 16  --   --   --   --   GLUCOSE 126*  < > 123* 133* 120*  < > = values in this interval not displayed.  Recent Labs Lab 01/31/14 0516 01/31/14 1142 01/31/14 1707 01/31/14 2308  TROPONINI <0.30 <0.30 <0.30 <0.30   BNP (08/07) 1316  Lactic acid (08/07) 1.2  Mg (08/09) 1.7   Imaging/Diagnostic Tests:  ED EKG: Narrow complex tachycardia, rate 155, unable to identify P waves Repeat EKG 8/7: NSR, rate 81 (on diltiazem drip) Repeat EKG  08/09: NSR, rate 82 (cardizem CD 180mg ) CV strip 08/10: Sinus tachycardia HR 160  CXR 8/7: No active cardiopulmonary disease. CTA Chest 8/7: bilateral lower lobe atelectasis but no new PE Echo 08/09: EF 60-65%  Janora Norlander, DO 02/03/2014, 6:49 AM PGY-1, [place] Intern pager: [phone], text pages welcome

## 2014-02-03 NOTE — Progress Notes (Signed)
Seen and examined.  Discussed with Dr. Lajuana Ripple.  Agree with her management and documentation.  The throat is improving nicely.  Tachycardia remains problematic and is the reason for continued hospitalization.

## 2014-02-04 ENCOUNTER — Other Ambulatory Visit (HOSPITAL_COMMUNITY): Payer: Self-pay | Admitting: Family Medicine

## 2014-02-04 DIAGNOSIS — I471 Supraventricular tachycardia: Principal | ICD-10-CM

## 2014-02-04 DIAGNOSIS — E669 Obesity, unspecified: Secondary | ICD-10-CM

## 2014-02-04 LAB — CBC
HEMATOCRIT: 38.2 % (ref 36.0–46.0)
HEMOGLOBIN: 12.3 g/dL (ref 12.0–15.0)
MCH: 30.1 pg (ref 26.0–34.0)
MCHC: 32.2 g/dL (ref 30.0–36.0)
MCV: 93.6 fL (ref 78.0–100.0)
Platelets: 415 10*3/uL — ABNORMAL HIGH (ref 150–400)
RBC: 4.08 MIL/uL (ref 3.87–5.11)
RDW: 15.7 % — ABNORMAL HIGH (ref 11.5–15.5)
WBC: 10.7 10*3/uL — AB (ref 4.0–10.5)

## 2014-02-04 LAB — BASIC METABOLIC PANEL
ANION GAP: 15 (ref 5–15)
BUN: 10 mg/dL (ref 6–23)
CHLORIDE: 96 meq/L (ref 96–112)
CO2: 28 mEq/L (ref 19–32)
Calcium: 8.7 mg/dL (ref 8.4–10.5)
Creatinine, Ser: 0.53 mg/dL (ref 0.50–1.10)
GFR calc non Af Amer: >90 mL/min (ref >90)
Glucose, Bld: 100 mg/dL — ABNORMAL HIGH (ref 70–99)
Potassium: 3.3 mEq/L — ABNORMAL LOW (ref 3.7–5.3)
SODIUM: 139 meq/L (ref 137–147)

## 2014-02-04 LAB — MAGNESIUM: MAGNESIUM: 1.5 mg/dL (ref 1.5–2.5)

## 2014-02-04 MED ORDER — DILTIAZEM HCL ER COATED BEADS 240 MG PO CP24: 240.0000 mg | ORAL_CAPSULE | Freq: Every day | ORAL | Status: DC

## 2014-02-04 MED ORDER — FLECAINIDE ACETATE 100 MG PO TABS
100.0000 mg | ORAL_TABLET | Freq: Two times a day (BID) | ORAL | Status: DC
  Filled 2014-02-04: qty 1

## 2014-02-04 MED ORDER — POTASSIUM CHLORIDE CRYS ER 20 MEQ PO TBCR
40.0000 meq | EXTENDED_RELEASE_TABLET | Freq: Two times a day (BID) | ORAL | Status: DC
  Administered 2014-02-04: 40 meq via ORAL
  Filled 2014-02-04: qty 2

## 2014-02-04 MED ORDER — CLINDAMYCIN HCL 300 MG PO CAPS: 600.0000 mg | ORAL_CAPSULE | Freq: Three times a day (TID) | ORAL | Status: DC

## 2014-02-04 MED ORDER — METOPROLOL TARTRATE 50 MG PO TABS: 50.0000 mg | ORAL_TABLET | Freq: Two times a day (BID) | ORAL | Status: DC

## 2014-02-04 MED ORDER — FLECAINIDE ACETATE 100 MG PO TABS
100.0000 mg | ORAL_TABLET | Freq: Two times a day (BID) | ORAL | Status: DC
Start: 2014-02-04 — End: 2014-02-04

## 2014-02-04 MED ORDER — METOPROLOL TARTRATE 50 MG PO TABS
50.0000 mg | ORAL_TABLET | Freq: Two times a day (BID) | ORAL | Status: DC
  Filled 2014-02-04: qty 1

## 2014-02-04 MED ORDER — POTASSIUM CHLORIDE CRYS ER 20 MEQ PO TBCR: 20.0000 meq | EXTENDED_RELEASE_TABLET | Freq: Two times a day (BID) | ORAL | Status: DC

## 2014-02-04 MED ORDER — FLECAINIDE ACETATE 100 MG PO TABS: 100.0000 mg | ORAL_TABLET | Freq: Two times a day (BID) | ORAL | Status: DC

## 2014-02-04 MED ORDER — HYDROCHLOROTHIAZIDE 25 MG PO TABS: 25.0000 mg | ORAL_TABLET | Freq: Every day | ORAL | Status: DC

## 2014-02-04 NOTE — Progress Notes (Signed)
I have seen patient and discussed current hospitalization. I agree with the excellent care provided by the primary team of [facility] and want to thank them for their continued efforts in caring for my patient.   Elias Dennington DO 

## 2014-02-04 NOTE — Discharge Summary (Signed)
[facility] Discharge Summary  Patient name: [name] White Medical record number: [id number] Date of birth: September 14, 1955 Age: 58 y.o. Gender: female Date of Admission: 01/31/2014  Date of Discharge: 02/04/14 Admitting Physician: Andrena Mews, MD  Primary Care Provider: Howard Pouch, DO Consultants: Cardiology, EP  Indication for Hospitalization: heart palpitations, chest tightness  Discharge Diagnoses/Problem List:  Acute dyspnea SVT Pharyngeal Cellulitis Hypokalemia  Disposition: Discharge patient home.  Discharge Condition: stable  Brief Hospital Course:  HPI: [name] White is a 58 y.o. female presenting with multiple complaints, including throat pain, chest palpitations, and dyspnea with chest congestion. She reports that 5 days ago she began having swelling all along her neck under her chin, sore throat, right ear pain, subjective fever, and palpitations. She took ibuprofen for throat pain but it did not improve, so she went to [facility] 2 days later where she had negative rapid strep and strep culture. She was started on augmentin for presumed developing bacterial sinusitis, which she never picked up. Since that time, she has had shortness of breath, chest tightness, chest conguestion, coughing up creamy phlegm, post nasal drip, subjective fever, worsened PND (has baseline PND reportedly since 2011). She denies nausea, vomiting, abdominal pain, diarrhea, dysuria, change in liquid PO (though she has less appetite), stiff neck, chest pain, or syncope. She has some dizziness with ear pain. Possible church members are sick contacts. She does not use O2 or CPAP at home.   EMS found her in narrow complex tachy, HR 150, wheezing diffusely, and congested and dosed albuterol treatment.    Acute dyspnea:  History of bilateral pulmonary emboli in July '15.  Repeat CTA chest revealed no worsening of PE's.  Patient received albuterol and solumedrol in ED.   Patient's home asthma medications were continued during her hospitalization.  Patient's O2 remained above 92% for the duration of her stay.  Patient to f/u with pulm outpatient.   SVT:  Patient was found to have a HR of 150-160's, narrow complex on EKG with a regular rhythm.  QTc at that time 488.  Troponins were cycled and were negative x4.  Patient was monitored on telemetry and repeat EKG were obtained.  Initially, patient converted to NSR with intermittent bursts of tachycardia.  Cardiology was consulted and a plan for ablation was not appropriate at that time.  Flecainide 50mg  BID was started, after Echo showed an EF of 60-65%. Flecainide was increased to 100mg  BID upon discharge per cardiology.  Of note, patient is also on Cymbalta.  There is an apparent interaction between these two medications where Flecainide can potentially accumulate when taken in conjunction with Cymbalta.  Pharmacist Pilar Plate) was consulted on this and recommended that the patient be continued on Flecainide 100mg  BID as directed.  Patient stable and HR 84 bpm on discharge.  Pharyngeal cellulitis:  Patient seen in ED by ENT and ruled out pharyngeal abscess.  Patient was afebrile on admission however reported subjective fevers. Rapid strep negative, strep culture negative, WBC 17.9 on admission. Daily CBCs obtained.  Patient started on Clindamycin IV and transitioned to by mouth Clindamycin.  Patient discharged with Rx for completion of 10 day therapy.  Hypokalemia: Likely due to Lasix use.  Patient monitored and repleted as needed.  Magnesium also monitored.  Mg 1.5 on discharge.  K up to 3.3 from 2.8.  Patient sent home with Rx for KDur 85meq BID.       Issues for Follow Up:   Follow up  with Dr Lovena Le for medication management/ablation plans.  Recommend repeat K level at PCP visit on Friday.  Significant Procedures: none  Significant Labs and Imaging:   Recent Labs Lab 02/02/14 0315 02/03/14 0526 02/04/14 0734   WBC 10.0 14.7* 10.7*  HGB 12.2 12.5 12.3  HCT 38.5 39.1 38.2  PLT 436* 458* 415*    Recent Labs Lab 01/31/14 0516 01/31/14 1707 02/01/14 0331 02/02/14 0315 02/03/14 0526 02/04/14 0343 02/04/14 0734  NA 139 138 141 137 139 139  --   K 2.8* 3.9 3.2* 3.2* 3.6* 3.3*  --   CL 96 100 100 97 96 96  --   CO2 27 22 26 25 28 28   --   GLUCOSE 126* 152* 123* 133* 120* 100*  --   BUN 8 6 9 10 6 10   --   CREATININE 0.60 0.47* 0.51 0.52 0.60 0.53  --   CALCIUM 9.0 9.1 9.1 8.8 8.9 8.7  --   MG 1.7  --   --  1.7  --   --  1.5  ALKPHOS 93  --   --   --   --   --   --   AST 16  --   --   --   --   --   --   ALT 14  --   --   --   --   --   --   ALBUMIN 3.2*  --   --   --   --   --   --       Results/Tests Pending at Time of Discharge: none  Discharge Medications:    Medication List    STOP taking these medications       amoxicillin-clavulanate 875-125 MG per tablet  Commonly known as:  AUGMENTIN     metoprolol-hydrochlorothiazide 50-25 MG per tablet  Commonly known as:  LOPRESSOR HCT     ranitidine 300 MG tablet  Commonly known as:  ZANTAC     verapamil 80 MG tablet  Commonly known as:  CALAN      TAKE these medications       albuterol 108 (90 BASE) MCG/ACT inhaler  Commonly known as:  PROVENTIL HFA;VENTOLIN HFA  Inhale 2 puffs into the lungs every 6 (six) hours as needed. For shortness of breath     albuterol (2.5 MG/3ML) 0.083% nebulizer solution  Commonly known as:  PROVENTIL  Take 3 mLs (2.5 mg total) by nebulization every 6 (six) hours as needed. For shortness of breath     ARIPiprazole 2 MG tablet  Commonly known as:  ABILIFY  Take 2 mg by mouth daily.     azelastine 0.05 % ophthalmic solution  Commonly known as:  OPTIVAR  Place 1 drop into both eyes 2 (two) times daily as needed. For dry/irritated eyes     budesonide-formoterol 160-4.5 MCG/ACT inhaler  Commonly known as:  SYMBICORT  Inhale 2 puffs into the lungs 2 (two) times daily.     cetirizine 10  MG tablet  Commonly known as:  ZYRTEC  Take 1 tablet (10 mg total) by mouth every morning.     clindamycin 300 MG capsule  Commonly known as:  CLEOCIN  Take 2 capsules (600 mg total) by mouth every 8 (eight) hours.     DEXILANT 60 MG capsule  Generic drug:  dexlansoprazole  Take 60 mg by mouth daily.     diclofenac sodium 1 % Gel  Commonly known as:  VOLTAREN  Apply 1 application topically 2 (two) times daily as needed. On knees for pain/inflamation     diltiazem 240 MG 24 hr capsule  Commonly known as:  CARDIZEM CD  Take 1 capsule (240 mg total) by mouth daily.     DULoxetine 60 MG capsule  Commonly known as:  CYMBALTA  Take 60 mg by mouth at bedtime.     flecainide 100 MG tablet  Commonly known as:  TAMBOCOR  Take 1 tablet (100 mg total) by mouth every 12 (twelve) hours.     hydrochlorothiazide 25 MG tablet  Commonly known as:  HYDRODIURIL  Take 1 tablet (25 mg total) by mouth daily.     ibuprofen 600 MG tablet  Commonly known as:  ADVIL,MOTRIN  Take 1 tablet (600 mg total) by mouth every 6 (six) hours as needed.     metoprolol 50 MG tablet  Commonly known as:  LOPRESSOR  Take 1 tablet (50 mg total) by mouth 2 (two) times daily.     Olopatadine HCl 0.6 % Soln  Place 1 puff into the nose at bedtime.     potassium chloride SA 20 MEQ tablet  Commonly known as:  K-DUR,KLOR-CON  Take 1 tablet (20 mEq total) by mouth 2 (two) times daily.     rivaroxaban 20 MG Tabs tablet  Commonly known as:  XARELTO  Take 1 tablet (20 mg total) by mouth daily with supper.     traZODone 50 MG tablet  Commonly known as:  DESYREL  Take 50 mg by mouth at bedtime as needed for sleep.        Discharge Instructions: Please refer to Patient Instructions section of EMR for full details.  Patient was counseled important signs and symptoms that should prompt return to medical care, changes in medications, dietary instructions, activity restrictions, and follow up appointments.   Follow-Up  Appointments: Follow-up Information   Follow up with Howard Pouch, DO On 02/07/2014. (1:45pm)    Specialty:  Family Medicine   Contact information:   [place] [address] [phone]       Follow up with Rexene Edison, NP On 02/07/2014. (4:15p)    Specialty:  Nurse Practitioner   Contact information:   [address] [place] [address] [phone]       Follow up with Cristopher Peru, MD On 03/25/2014. (1:45p)    Specialty:  Cardiology   Contact information:   [address] [address] [phone]       Janora Norlander, DO 02/04/2014, 2:52 PM PGY-1, [place]

## 2014-02-04 NOTE — Progress Notes (Signed)
     Subjective:  No CP. No SOB. No new issues, doing well.  She did have continued short bursts approximately 30 minutes to one hour of SVT.  Objective:  Vital Signs in the last 24 hours: Temp:  [98.4 F (36.9 C)-99.2 F (37.3 C)] 98.4 F (36.9 C) (08/11 0513) Pulse Rate:  [135-146] 139 (08/11 0513) Resp:  [21] 21 (08/11 0513) BP: (95-132)/(69-78) 107/75 mmHg (08/11 1146) SpO2:  [94 %-96 %] 95 % (08/11 0513) Weight:  [300 lb 4.3 oz (136.2 kg)] 300 lb 4.3 oz (136.2 kg) (08/11 0513)  Intake/Output from previous day: 08/10 0701 - 08/11 0700 In: 600 [P.O.:600] Out: 650 [Urine:650]   Physical Exam: General: Well developed, well nourished, in no acute distress. Head:  Normocephalic and atraumatic. Lungs: Clear to auscultation and percussion. Heart: Normal S1 and S2.  No murmur, rubs or gallops.  Abdomen: soft, non-tender, positive bowel sounds. Obese Extremities: No clubbing or cyanosis. No edema. Neurologic: Alert and oriented x 3.    Lab Results:  Recent Labs  02/03/14 0526 02/04/14 0734  WBC 14.7* 10.7*  HGB 12.5 12.3  PLT 458* 415*    Recent Labs  02/03/14 0526 02/04/14 0343  NA 139 139  K 3.6* 3.3*  CL 96 96  CO2 28 28  GLUCOSE 120* 100*  BUN 6 10  CREATININE 0.60 0.53    Telemetry: Long RP tachycardia, PSVT, 9 to 10 AM this morning. Personally viewed.   EKG:  Prior EKGs reviewed.  Cardiac Studies:  Echo with normal ejection fraction.  Assessment/Plan:  Principal Problem:   Narrow complex tachycardia Active Problems:   DEPRESSIVE DISORDER, NOS   ASTHMA, PERSISTENT   Pulmonary embolism   Sustained SVT  1. Paroxysmal supraventricular tachycardia-given her continued short bursts of PSVT, I increased her flecainide dose from 50 mg up to 100 mg twice a day. She will be seeing Dr. Cristopher Peru of electrophysiology in followup. She was asymptomatic earlier this morning with her increased heart rate. Previously she was complaining of chest tightness,  shortness of breath. I have consolidated metoprolol from 25 mg 3 times a day to 50 mg twice a day.  I'm comfortable with her discharge.  Flecainide 100 mg twice a day Cardizem 240 mg once a day Metoprolol 50 mg twice a day  She has followup scheduled with Dr. Lovena Le.  [name] White, [place] 02/04/2014, 1:03 PM

## 2014-02-04 NOTE — Progress Notes (Signed)
Seen and examined.  Agree with documentation and management of Dr. Lajuana Ripple.  Ms Watt is back in sinus rhythm now.  Throat pain continues to improve nicely.  OK for DC today.

## 2014-02-04 NOTE — Progress Notes (Signed)
02/04/2014 2:47 PM Nursing note Discharge avs form, medications already taken today and those due this evening given and explained to patient. Location of called in rx reviewed. Follow up appointments and when to call MD reviewed. D/c iv line. D/c tele. D/c home with family per orders.  [name] White, Arville Lime

## 2014-02-04 NOTE — Progress Notes (Signed)
Family Medicine Teaching Service Daily Progress Note Intern Pager: [phone]  Patient name: [name] White Medical record number: [id number] Date of birth: 01/28/1956 Age: 58 y.o. Gender: female  Primary Care Provider: Howard Pouch, DO Consultants: cardiology, EP Code Status: FULL  Pt Overview and Major Events to Date:  [name] White is a 58 y.o. female presenting with multiple complaints including sore throat, chest congestion/tightness, and palptitaions. PMH is significant for persistent asthma vs COPD in former smoker, depression, HTN, hypothyroidism, LE edema, obesity, bilateral knee OA, recent dx with multiple PEs 12/2013.   8/7: admitted, on diltiazem drip with improvement in heart rate from 150's to 80's; CTA negative for new PE 8/8: awaiting echocardiogram for possible addition of flecainide; final cariology / EP recs pending echo 8/9: Tachy overnight 130-160's.  SOB overnight w/chest tightness. Breathing treatment and PRN metoprolol given (x2 overnight). Echo: LVEF 60-65% 8/10: Patient in and out of tachy throughout the night.  Flecainide added yesterday afternoon. 8/11: NSR 86 Only 1 episode of burt tachy overnight.  Cardizem increased yesterday to 240.  Assessment and Plan: 1. Tachycardia - HR 86 this morning.  Uncertain etiology, though expect SVT +/- paroxysmal afib with RVR improved on diltiazem drip; initially rate 150-160s, narrow complex, with EKG showing regular rhythm though p waves difficult to find and QTc 488. Afebrile, troponins negative - cardiology and EP consulted, EP states that there are no plans for ablation at this time. - cardiology / EP recs; appreciate assistance with management - continue telemetry monitoring - LVEF 60-65% - Continue po metorpolol 25 tid and IV metoprolol 2.5 PRN hr>130, Consider increasing to 5mg  IV up to x3 over 5 minutes for uncontrolled tachycardia per Dr Elias Else. - Cardizem 240 mg qd - Flecainide 50mg  PO BID  2. Dyspnea -  improved, likely related to tachycardia (see above), but DDx includes asthma exacerbation, heart failure (increased BNP 1316, no prior to compare); worsened PE unlikely given no new PE on CTA - somewhat improved s/p breathing treatment 8/9, and with improvement in tachycardia, as above - continue albuterol PRN and home Symbicort  - continue O2 as needed on 2L O2, wean to room air - considering sleep study outpatient recommended with likely OHS/OSA component   3. Recent PE - no new PE on repeat CTA; continue Xarelto - considering hypercoag workup as outpatient  4. Throat pain -IMPROVING. Apparent cellulitis s/p consult by ENT in the ED, with subjective fevers PTA and WBC 17.9 on admission,  strep culture neg 8/5, rapid strep 8/7 negative, repeat culture Negative for Beta Hemolytic Strep - WBC down to 10.7 - Day #5 of Clindamycin 600mg  q8 hours PO (anticipate 10 day course) - Cepacol lozenge PRN - Continuous pulse oximetry - F/u with ENT outpatient, but will reconsult inpt if necessary  5. Hypokalemia - K 2.8 on admission, improving: 3.3 (8/11 AM) - Repleting K as needed, increased K to 40 meq BID (likely loss d/t lasix use) - Mg 1.5 today, monitor and replete as needed - recheck BMP as needed  6. Depression - continuing home Abilify, Cymbalta, trazodone  7. Allergies - continuing home antihistamine, eye drops,   8. GERD - continue home PPI, Pepcid (Protonix per formulary)  9. Hx of hypothyroidism - not on Synthroid at home, TSH 0.478 normal 8/8  FEN/GI: full liquid diet, advance as tolerated; KVO fluids PPx: on Xarelto, Protonix as above  Disposition: management as above; Plan for discharge today pending cardiology input.  Subjective:  Patient reports that sore  throat is greatly improved but still a little sore.  Energy is substantially better than yesterday. Patient is tolerating PO intake. Patient denies SOB, CP, N/V, headache.   Objective: Temp:  [98.4 F (36.9 C)-99.2 F  (37.3 C)] 98.4 F (36.9 C) (08/11 0513) Pulse Rate:  [135-146] 139 (08/11 0513) Resp:  [21] 21 (08/11 0513) BP: (95-132)/(71-78) 95/74 mmHg (08/11 0513) SpO2:  [94 %-96 %] 95 % (08/11 0513) Weight:  [300 lb 4.3 oz (136.2 kg)] 300 lb 4.3 oz (136.2 kg) (08/11 0513) Physical Exam: General: NAD, morbidly obese female lying in bed, sits up without assistance HEENT: [place]/AT, EOMI, no stridor, neck supple, no appreciable anterior or posterior cervical LAD Cardiovascular: S1S2 RRR, II/VI systolic flow murmur unchanged Respiratory: globally decreased air movement but no wheezes appreciated Abdomen: obese, soft, nontender, BS+ Extremities: No LE edema or calf tenderness, no asymmetry  Skin: No rash or cyanosis  Neuro: AAOx3, normal speech, moves all extremities equally  Laboratory:  Recent Labs Lab 02/02/14 0315 02/03/14 0526 02/04/14 0734  WBC 10.0 14.7* 10.7*  HGB 12.2 12.5 12.3  HCT 38.5 39.1 38.2  PLT 436* 458* 415*    Recent Labs Lab 01/31/14 0516  02/02/14 0315 02/03/14 0526 02/04/14 0343  NA 139  < > 137 139 139  K 2.8*  < > 3.2* 3.6* 3.3*  CL 96  < > 97 96 96  CO2 27  < > 25 28 28   BUN 8  < > 10 6 10   CREATININE 0.60  < > 0.52 0.60 0.53  CALCIUM 9.0  < > 8.8 8.9 8.7  PROT 8.1  --   --   --   --   BILITOT 0.6  --   --   --   --   ALKPHOS 93  --   --   --   --   ALT 14  --   --   --   --   AST 16  --   --   --   --   GLUCOSE 126*  < > 133* 120* 100*  < > = values in this interval not displayed.  Recent Labs Lab 01/31/14 0516 01/31/14 1142 01/31/14 1707 01/31/14 2308  TROPONINI <0.30 <0.30 <0.30 <0.30   BNP (08/07) 1316  Lactic acid (08/07) 1.2  Mg (08/09) 1.7 Mg (08/11): 1.5   Imaging/Diagnostic Tests:  ED EKG: Narrow complex tachycardia, rate 155, unable to identify P waves Repeat EKG 8/7: NSR, rate 81 (on diltiazem drip) Repeat EKG 08/09: NSR, rate 82 (cardizem CD 180mg ) CV strip 08/10: Sinus tachycardia HR 160  CXR 8/7: No active cardiopulmonary  disease. CTA Chest 8/7: bilateral lower lobe atelectasis but no new PE Echo 08/09: EF 60-65%  Janora Norlander, DO 02/04/2014, 9:47 AM PGY-1, [place] Intern pager: [phone], text pages welcome

## 2014-02-04 NOTE — Discharge Summary (Signed)
Seen and examined.  Agree with discharge, documentation and management as outlined by Dr.Gottschalk.

## 2014-02-07 ENCOUNTER — Inpatient Hospital Stay: Payer: Medicare HMO | Admitting: Adult Health

## 2014-02-07 ENCOUNTER — Inpatient Hospital Stay: Payer: Medicare HMO | Admitting: Family Medicine

## 2014-03-05 ENCOUNTER — Emergency Department (HOSPITAL_COMMUNITY)
Admission: EM | Admit: 2014-03-05 | Discharge: 2014-03-06 | Disposition: A | Discharge status: Home / Self Care | Payer: Medicare HMO | Attending: Emergency Medicine | Admitting: Emergency Medicine

## 2014-03-05 ENCOUNTER — Ambulatory Visit (HOSPITAL_COMMUNITY)
Admission: RE | Admit: 2014-03-05 | Discharge: 2014-03-05 | Disposition: A | Discharge status: Home / Self Care | Payer: Medicare HMO | Source: Home / Self Care | Attending: Psychiatry | Admitting: Psychiatry

## 2014-03-05 ENCOUNTER — Encounter (HOSPITAL_COMMUNITY): Payer: Self-pay | Admitting: Emergency Medicine

## 2014-03-05 DIAGNOSIS — G8929 Other chronic pain: Secondary | ICD-10-CM | POA: Diagnosis not present

## 2014-03-05 DIAGNOSIS — G43909 Migraine, unspecified, not intractable, without status migrainosus: Secondary | ICD-10-CM | POA: Diagnosis not present

## 2014-03-05 DIAGNOSIS — F32A Depression, unspecified: Secondary | ICD-10-CM

## 2014-03-05 DIAGNOSIS — I471 Supraventricular tachycardia, unspecified: Secondary | ICD-10-CM

## 2014-03-05 DIAGNOSIS — G479 Sleep disorder, unspecified: Secondary | ICD-10-CM | POA: Insufficient documentation

## 2014-03-05 DIAGNOSIS — F3289 Other specified depressive episodes: Secondary | ICD-10-CM | POA: Diagnosis not present

## 2014-03-05 DIAGNOSIS — F411 Generalized anxiety disorder: Secondary | ICD-10-CM | POA: Insufficient documentation

## 2014-03-05 DIAGNOSIS — K219 Gastro-esophageal reflux disease without esophagitis: Secondary | ICD-10-CM | POA: Diagnosis not present

## 2014-03-05 DIAGNOSIS — I10 Essential (primary) hypertension: Secondary | ICD-10-CM | POA: Diagnosis not present

## 2014-03-05 DIAGNOSIS — J45909 Unspecified asthma, uncomplicated: Secondary | ICD-10-CM | POA: Diagnosis not present

## 2014-03-05 DIAGNOSIS — Z87891 Personal history of nicotine dependence: Secondary | ICD-10-CM | POA: Insufficient documentation

## 2014-03-05 DIAGNOSIS — Z79899 Other long term (current) drug therapy: Secondary | ICD-10-CM | POA: Insufficient documentation

## 2014-03-05 DIAGNOSIS — Z86711 Personal history of pulmonary embolism: Secondary | ICD-10-CM | POA: Insufficient documentation

## 2014-03-05 DIAGNOSIS — R45851 Suicidal ideations: Secondary | ICD-10-CM | POA: Diagnosis not present

## 2014-03-05 DIAGNOSIS — Z791 Long term (current) use of non-steroidal anti-inflammatories (NSAID): Secondary | ICD-10-CM | POA: Insufficient documentation

## 2014-03-05 DIAGNOSIS — F332 Major depressive disorder, recurrent severe without psychotic features: Secondary | ICD-10-CM

## 2014-03-05 DIAGNOSIS — F329 Major depressive disorder, single episode, unspecified: Secondary | ICD-10-CM | POA: Diagnosis present

## 2014-03-05 LAB — ACETAMINOPHEN LEVEL: Acetaminophen (Tylenol), Serum: <15 ug/mL (ref 10–30)

## 2014-03-05 LAB — RAPID URINE DRUG SCREEN, HOSP PERFORMED
Amphetamines: NOT DETECTED
BARBITURATES: NOT DETECTED
BENZODIAZEPINES: NOT DETECTED
Cocaine: NOT DETECTED
Opiates: NOT DETECTED
Tetrahydrocannabinol: NOT DETECTED

## 2014-03-05 LAB — COMPREHENSIVE METABOLIC PANEL
ALK PHOS: 82 U/L (ref 39–117)
ALT: 14 U/L (ref 0–35)
AST: 15 U/L (ref 0–37)
Albumin: 3.5 g/dL (ref 3.5–5.2)
Anion gap: 15 (ref 5–15)
BILIRUBIN TOTAL: 0.3 mg/dL (ref 0.3–1.2)
BUN: 16 mg/dL (ref 6–23)
CHLORIDE: 94 meq/L — AB (ref 96–112)
CO2: 28 meq/L (ref 19–32)
Calcium: 9.7 mg/dL (ref 8.4–10.5)
Creatinine, Ser: 0.71 mg/dL (ref 0.50–1.10)
GFR calc Af Amer: >90 mL/min (ref >90)
GLUCOSE: 129 mg/dL — AB (ref 70–99)
POTASSIUM: 2.5 meq/L — AB (ref 3.7–5.3)
SODIUM: 137 meq/L (ref 137–147)
Total Protein: 8.6 g/dL — ABNORMAL HIGH (ref 6.0–8.3)

## 2014-03-05 LAB — CBC
HCT: 42.9 % (ref 36.0–46.0)
HEMOGLOBIN: 14.1 g/dL (ref 12.0–15.0)
MCH: 30 pg (ref 26.0–34.0)
MCHC: 32.9 g/dL (ref 30.0–36.0)
MCV: 91.3 fL (ref 78.0–100.0)
Platelets: 404 10*3/uL — ABNORMAL HIGH (ref 150–400)
RBC: 4.7 MIL/uL (ref 3.87–5.11)
RDW: 15.6 % — ABNORMAL HIGH (ref 11.5–15.5)
WBC: 9.2 10*3/uL (ref 4.0–10.5)

## 2014-03-05 LAB — SALICYLATE LEVEL

## 2014-03-05 LAB — POTASSIUM
POTASSIUM: 3.1 meq/L — AB (ref 3.7–5.3)
Potassium: 3.2 mEq/L — ABNORMAL LOW (ref 3.7–5.3)

## 2014-03-05 LAB — PROTIME-INR
INR: 0.91 (ref 0.00–1.49)
Prothrombin Time: 12.3 seconds (ref 11.6–15.2)

## 2014-03-05 LAB — I-STAT TROPONIN, ED: Troponin i, poc: 0.02 ng/mL (ref 0.00–0.08)

## 2014-03-05 LAB — ETHANOL: Alcohol, Ethyl (B): <11 mg/dL (ref 0–11)

## 2014-03-05 MED ORDER — ONDANSETRON HCL 4 MG PO TABS: 4.0000 mg | ORAL_TABLET | Freq: Three times a day (TID) | ORAL | Status: DC | PRN

## 2014-03-05 MED ORDER — METOPROLOL TARTRATE 25 MG PO TABS
50.0000 mg | ORAL_TABLET | Freq: Two times a day (BID) | ORAL | Status: DC
  Administered 2014-03-05 (×2): 50 mg via ORAL
  Filled 2014-03-05 (×2): qty 2

## 2014-03-05 MED ORDER — ALBUTEROL SULFATE HFA 108 (90 BASE) MCG/ACT IN AERS: 2.0000 | INHALATION_SPRAY | Freq: Four times a day (QID) | RESPIRATORY_TRACT | Status: DC | PRN

## 2014-03-05 MED ORDER — KETOTIFEN FUMARATE 0.025 % OP SOLN
1.0000 [drp] | Freq: Two times a day (BID) | OPHTHALMIC | Status: DC | PRN
  Filled 2014-03-05: qty 5

## 2014-03-05 MED ORDER — POTASSIUM CHLORIDE CRYS ER 20 MEQ PO TBCR
20.0000 meq | EXTENDED_RELEASE_TABLET | Freq: Two times a day (BID) | ORAL | Status: DC
  Administered 2014-03-05 (×2): 20 meq via ORAL
  Filled 2014-03-05 (×2): qty 1

## 2014-03-05 MED ORDER — DULOXETINE HCL 60 MG PO CPEP
60.0000 mg | ORAL_CAPSULE | Freq: Every day | ORAL | Status: DC
  Administered 2014-03-05: 60 mg via ORAL
  Filled 2014-03-05: qty 1

## 2014-03-05 MED ORDER — DILTIAZEM HCL ER COATED BEADS 240 MG PO CP24
240.0000 mg | ORAL_CAPSULE | Freq: Every day | ORAL | Status: DC
  Administered 2014-03-05: 240 mg via ORAL
  Filled 2014-03-05: qty 1

## 2014-03-05 MED ORDER — ZOLPIDEM TARTRATE 5 MG PO TABS: 5.0000 mg | ORAL_TABLET | Freq: Every evening | ORAL | Status: DC | PRN

## 2014-03-05 MED ORDER — ALBUTEROL SULFATE (2.5 MG/3ML) 0.083% IN NEBU
2.5000 mg | INHALATION_SOLUTION | Freq: Four times a day (QID) | RESPIRATORY_TRACT | Status: DC | PRN
  Filled 2014-03-05: qty 3

## 2014-03-05 MED ORDER — LORATADINE 10 MG PO TABS
10.0000 mg | ORAL_TABLET | Freq: Every day | ORAL | Status: DC
  Administered 2014-03-05: 10 mg via ORAL
  Filled 2014-03-05: qty 1

## 2014-03-05 MED ORDER — BUDESONIDE-FORMOTEROL FUMARATE 160-4.5 MCG/ACT IN AERO
2.0000 | INHALATION_SPRAY | Freq: Two times a day (BID) | RESPIRATORY_TRACT | Status: DC
  Administered 2014-03-05: 2 via RESPIRATORY_TRACT
  Filled 2014-03-05: qty 6

## 2014-03-05 MED ORDER — ARIPIPRAZOLE 2 MG PO TABS
2.0000 mg | ORAL_TABLET | Freq: Every day | ORAL | Status: DC
  Administered 2014-03-05: 2 mg via ORAL
  Filled 2014-03-05: qty 1

## 2014-03-05 MED ORDER — ACETAMINOPHEN 325 MG PO TABS: 650.0000 mg | ORAL_TABLET | ORAL | Status: DC | PRN

## 2014-03-05 MED ORDER — IBUPROFEN 200 MG PO TABS: 600.0000 mg | ORAL_TABLET | Freq: Three times a day (TID) | ORAL | Status: DC | PRN

## 2014-03-05 MED ORDER — PANTOPRAZOLE SODIUM 40 MG PO TBEC
40.0000 mg | DELAYED_RELEASE_TABLET | Freq: Every day | ORAL | Status: DC
  Administered 2014-03-05: 40 mg via ORAL
  Filled 2014-03-05: qty 1

## 2014-03-05 MED ORDER — HYDROCHLOROTHIAZIDE 25 MG PO TABS
25.0000 mg | ORAL_TABLET | Freq: Every day | ORAL | Status: DC
  Administered 2014-03-05: 25 mg via ORAL
  Filled 2014-03-05: qty 1

## 2014-03-05 MED ORDER — RIVAROXABAN 20 MG PO TABS
20.0000 mg | ORAL_TABLET | Freq: Every day | ORAL | Status: DC
  Administered 2014-03-05: 20 mg via ORAL
  Filled 2014-03-05: qty 1

## 2014-03-05 MED ORDER — TRAZODONE HCL 50 MG PO TABS: 50.0000 mg | ORAL_TABLET | Freq: Every evening | ORAL | Status: DC | PRN

## 2014-03-05 MED ORDER — OLOPATADINE HCL 0.6 % NA SOLN: 1.0000 | Freq: Every day | NASAL | Status: DC

## 2014-03-05 MED ORDER — FLECAINIDE ACETATE 100 MG PO TABS
100.0000 mg | ORAL_TABLET | Freq: Two times a day (BID) | ORAL | Status: DC
  Administered 2014-03-05: 100 mg via ORAL
  Filled 2014-03-05: qty 1

## 2014-03-05 MED ORDER — POTASSIUM CHLORIDE CRYS ER 20 MEQ PO TBCR
40.0000 meq | EXTENDED_RELEASE_TABLET | Freq: Once | ORAL | Status: AC
  Administered 2014-03-05: 40 meq via ORAL
  Filled 2014-03-05: qty 2

## 2014-03-05 NOTE — ED Notes (Signed)
Pt to go to [facility] room 35

## 2014-03-05 NOTE — ED Notes (Signed)
Caryl Pina from [facility] called, states they have bed available for pt once she is medically stable; requests that potassium be redrawn before sending her over Phone: [phone]  Fax updated potassium level to: [phone]

## 2014-03-05 NOTE — Discharge Instructions (Signed)
Suicidal Feelings, How to Help Yourself °Everyone feels sad or unhappy at times, but depressing thoughts and feelings of hopelessness can lead to thoughts of suicide. It can seem as if life is too tough to handle. If you feel as though you have reached the point where suicide is the only answer, it is time to let someone know immediately.  °HOW TO COPE AND PREVENT SUICIDE °· Let family, friends, teachers, or counselors know. Get help. Try not to isolate yourself from those who care about you. Even though you may not feel sociable, talk with someone every day. It is best if it is face-to-face. Remember, they will want to help you. °· Eat a regularly spaced and well-balanced diet. °· Get plenty of rest. °· Avoid alcohol and drugs because they will only make you feel worse and may also lower your inhibitions. Remove them from the home. If you are thinking of taking an overdose of your prescribed medicines, give your medicines to someone who can give them to you one day at a time. If you are on antidepressants, let your caregiver know of your feelings so he or she can provide a safer medicine, if that is a concern. °· Remove weapons or poisons from your home. °· Try to stick to routines. Follow a schedule and remind yourself that you have to keep that schedule every day. °· Set some realistic goals and achieve them. Make a list and cross things off as you go. Accomplishments give a sense of worth. Wait until you are feeling better before doing things you find difficult or unpleasant to do. °· If you are able, try to start exercising. Even half-hour periods of exercise each day will make you feel better. Getting out in the sun or into nature helps you recover from depression faster. If you have a favorite place to walk, take advantage of that. °· Increase safe activities that have always given you pleasure. This may include playing your favorite music, reading a good book, painting a picture, or playing your favorite  instrument. Do whatever takes your mind off your depression. °· Keep your living space well-lighted. °GET HELP °Contact a suicide hotline, crisis center, or local suicide prevention center for help right away. Local centers may include a hospital, clinic, community service organization, social service provider, or health department. °· Call your local emergency services (911 in the [place]). °· Call a suicide hotline: °¨ 1-800-273-TALK (1-800-273-8255) in the [place]. °¨ 1-800-SUICIDE (1-800-784-2433) in the [place]. °¨ 1-888-628-9454 in the [place] for Spanish-speaking counselors. °¨ 1-800-799-4TTY (1-800-799-4889) in the [place] for TTY users. °· Visit the following websites for information and help: °¨ National Suicide Prevention Lifeline: www.suicidepreventionlifeline.org °¨ Hopeline: www.hopeline.com °¨ American Foundation for Suicide Prevention: www.afsp.org °· For lesbian, gay, bisexual, transgender, or questioning youth, contact The [employer]: °¨ 1-866-4-U-TREVOR (1-866-488-7386) in the [place]. °¨ www.thetrevorproject.org °· In [place], treatment resources are listed in each province with listings available under [employer] or similar titles. Another source for Crisis Centres by Province is located at http://www.suicideprevention.ca/in-crisis-now/find-a-crisis-centre-now/crisis-centres °Document Released: 12/18/2002 Document Revised: 09/05/2011 Document Reviewed: 10/08/2013 °ExitCare® Patient Information ©2015 [employer]. This information is not intended to replace advice given to you by your health care provider. Make sure you discuss any questions you have with your health care provider. ° °

## 2014-03-05 NOTE — ED Notes (Signed)
Patient resting in her room, awake . Denied SI/HI and denied Hallucinations. Mood and affects flat and depressed. Patient stated that she's is actually feeling calm and better that she was when she came to the unit. Writer encouraged and supported patient. Notified the Lab to come and collect specimen for the Potassium level order. Q 15 minute check continues as ordered to maintain safety.
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
## 2014-03-05 NOTE — ED Notes (Signed)
Informed CSW that patient has a concern with return transportation from [facility].

## 2014-03-05 NOTE — ED Notes (Signed)
[name] White phones prior to her arrival to tell us she will be coming to [place] shortly from [facility].  Will need inp't. Placement; and they currently have no beds.  She is s.i. With ideation to overdose.

## 2014-03-05 NOTE — ED Notes (Signed)
Bed: WBH35 Expected date:  Expected time:  Means of arrival:  Comments: Tr 2 

## 2014-03-05 NOTE — ED Provider Notes (Signed)
CSN: [id number]     Arrival date & time 03/05/14  1224 History  This chart was scribed for non-physician practitioner, Starlyn Skeans, PA-C,working with Ephraim Hamburger, MD, by Marlowe Kays, ED Scribe. This patient was seen in room WTR4/WLPT4 and the patient's care was started at 1:22 PM.  Chief Complaint  Patient presents with  . Suicidal  . Depression   The history is provided by the patient. No language interpreter was used.   HPI Comments:  [name] White is a 58 y.o. obese female who presents to the Emergency Department needing medical clearance so she can be admitted to [facility]. She states she has been experiencing increased depression, anxiety and suicidal thoughts for the past three days. She states she recently lost her apartment, has had car issues and health problems and states she is now feeling overwhelmed. She reports taking her Abilify and Cymbalta as prescribed but states she does not feel as if they are helping. Her plan of suicide is to overdose on her medications. She reports a past attempt of suicide four years ago by overdosing on sleeping pills. Pt cannot sleep normally. She denies HI, audio or visual hallucinations, SOB or CP. She states she has not taken her home medication for her heart issues. Pt reports going to [facility] and sees the therapists there and her PCP is at [facility]. Her last appt with Ms. Glennon Mac, her therapist was approximately four months ago. Her medication allergies include lisinopril, erythromycin, Vicodin, sulfa and oxycodone. Pt has PMH of asthma, HTN, depression, anxiety, pulmonary embolism bilaterally, migraine and chronic back pain. She denies alcohol, tobacco or illicit drug use.  Past Medical History  Diagnosis Date  . Asthma   . Hypertension   . GERD (gastroesophageal reflux disease)   . Anxiety   . Depression   . Family history of anesthesia complication     "daughter; causes her to pass out afterwards"  .  Pulmonary embolism 12/28/2013    "2 clots in each lung"  . Hyperthyroidism   . Migraine     "monthly" (12/28/2013)  . Osteoarthritis     "both knees; back of my neck; right pelvic bone" (12/28/2013)  . Chronic lower back pain    Past Surgical History  Procedure Laterality Date  . Abdominal hysterectomy    . Wisdom tooth extraction    . Thyroidectomy, partial Right 2005  . Nasal sinus surgery  2007  . Appendectomy    . Excisional hemorrhoidectomy    . Dilation and curettage of uterus    . Tubal ligation    . Breast cyst excision Right    Family History  Problem Relation Age of Onset  . Osteoarthritis Mother   . Asthma Mother   . Heart failure Mother    History  Substance Use Topics  . Smoking status: Former Smoker -- 0.25 packs/day for 20 years    Types: Cigarettes    Quit date: 07/17/1999  . Smokeless tobacco: Never Used  . Alcohol Use: Yes     Comment: "drank some in my 60's"   OB History   Grav Para Term Preterm Abortions TAB SAB Ect Mult Living                 Review of Systems  Respiratory: Negative for shortness of breath.   Cardiovascular: Negative for chest pain.  Psychiatric/Behavioral: Positive for suicidal ideas and sleep disturbance. Negative for hallucinations and self-injury.  All other systems reviewed and are negative.  Allergies  Caffeine; Ciprofloxacin; Lisinopril; Vicodin; Erythromycin; Oxycodone; and Sulfamethoxazole-trimethoprim  Home Medications   Prior to Admission medications   Medication Sig Start Date End Date Taking? Authorizing Provider  albuterol (PROVENTIL HFA;VENTOLIN HFA) 108 (90 BASE) MCG/ACT inhaler Inhale 2 puffs into the lungs every 6 (six) hours as needed. For shortness of breath   Yes Historical Provider, MD  albuterol (PROVENTIL) (2.5 MG/3ML) 0.083% nebulizer solution Take 3 mLs (2.5 mg total) by nebulization every 6 (six) hours as needed. For shortness of breath 04/07/13  Yes Hilton Sinclair, MD  ARIPiprazole (ABILIFY) 2  MG tablet Take 2 mg by mouth at bedtime.    Yes Historical Provider, MD  azelastine (OPTIVAR) 0.05 % ophthalmic solution Place 1 drop into both eyes 2 (two) times daily as needed. For dry/irritated eyes   Yes Historical Provider, MD  budesonide-formoterol (SYMBICORT) 160-4.5 MCG/ACT inhaler Inhale 2 puffs into the lungs 2 (two) times daily.     Yes Historical Provider, MD  cetirizine (ZYRTEC) 10 MG tablet Take 1 tablet (10 mg total) by mouth every morning. 04/07/13  Yes Hilton Sinclair, MD  dexlansoprazole (DEXILANT) 60 MG capsule Take 60 mg by mouth every morning.    Yes Historical Provider, MD  diclofenac sodium (VOLTAREN) 1 % GEL Apply 2 g topically 2 (two) times daily as needed (For pain or inflammation on knees.).    Yes Historical Provider, MD  diltiazem (CARDIZEM CD) 240 MG 24 hr capsule Take 240 mg by mouth every morning.   Yes Historical Provider, MD  DULoxetine (CYMBALTA) 60 MG capsule Take 60 mg by mouth at bedtime.    Yes Historical Provider, MD  flecainide (TAMBOCOR) 100 MG tablet Take 1 tablet (100 mg total) by mouth every 12 (twelve) hours. 02/04/14  Yes Ashly M Gottschalk, DO  hydrochlorothiazide (HYDRODIURIL) 25 MG tablet Take 25 mg by mouth every morning.   Yes Historical Provider, MD  metoprolol (LOPRESSOR) 50 MG tablet Take 1 tablet (50 mg total) by mouth 2 (two) times daily. 02/04/14  Yes Ashly M Gottschalk, DO  Olopatadine HCl 0.6 % SOLN Place 1 puff into the nose at bedtime as needed (For seasonal allergies.).    Yes Historical Provider, MD  rivaroxaban (XARELTO) 20 MG TABS tablet Take 1 tablet (20 mg total) by mouth daily with supper. 01/18/14 03/29/14 Yes Archie Patten, MD  traZODone (DESYREL) 50 MG tablet Take 50 mg by mouth at bedtime as needed for sleep.    Yes Historical Provider, MD   Triage Vitals: BP 109/75  Pulse 135  Temp(Src) 98.2 F (36.8 C) (Oral)  Resp 16  SpO2 98% Physical Exam  Nursing note and vitals reviewed. Constitutional: She is oriented to  person, place, and time. She appears well-developed and well-nourished.  HENT:  Head: Normocephalic and atraumatic.  Nose: Nose normal.  Mouth/Throat: Oropharynx is clear and moist.  Eyes: Conjunctivae and EOM are normal. Pupils are equal, round, and reactive to light. No scleral icterus.  Neck: Normal range of motion. Neck supple. No JVD present. No thyromegaly present.  Cardiovascular: Regular rhythm and normal heart sounds.  Tachycardia present.  Exam reveals no gallop and no friction rub.   No murmur heard. Pulmonary/Chest: Effort normal and breath sounds normal. No respiratory distress. She has no decreased breath sounds. She has no wheezes. She has no rhonchi. She has no rales.  Abdominal: Soft. Bowel sounds are normal. She exhibits no distension and no mass. There is no tenderness. There is no rebound and no guarding.  Musculoskeletal: Normal range of motion.  Lymphadenopathy:    She has no cervical adenopathy.  Neurological: She is alert and oriented to person, place, and time. She has normal strength. No cranial nerve deficit or sensory deficit. Coordination normal.  Skin: Skin is warm and dry.  Psychiatric: Her speech is normal. She is withdrawn. Thought content is not paranoid and not delusional. She exhibits a depressed mood. She expresses suicidal ideation. She expresses no homicidal ideation. She expresses suicidal plans. She expresses no homicidal plans.    ED Course  Procedures (including critical care time) DIAGNOSTIC STUDIES: Oxygen Saturation is 98% on RA, normal by my interpretation.   COORDINATION OF CARE: 1:32 PM- Will order standard medical clearance labs and home meds. Pt verbalizes understanding and agrees to plan.  Medications  zolpidem (AMBIEN) tablet 5 mg (not administered)  ibuprofen (ADVIL,MOTRIN) tablet 600 mg (not administered)  acetaminophen (TYLENOL) tablet 650 mg (not administered)  ondansetron (ZOFRAN) tablet 4 mg (not administered)  albuterol  (PROVENTIL HFA;VENTOLIN HFA) 108 (90 BASE) MCG/ACT inhaler 2 puff (not administered)  albuterol (PROVENTIL) (2.5 MG/3ML) 0.083% nebulizer solution 2.5 mg (not administered)  ARIPiprazole (ABILIFY) tablet 2 mg (2 mg Oral Given 03/05/14 1707)  ketotifen (ZADITOR) 0.025 % ophthalmic solution 1 drop (not administered)  budesonide-formoterol (SYMBICORT) 160-4.5 MCG/ACT inhaler 2 puff (not administered)  loratadine (CLARITIN) tablet 10 mg (10 mg Oral Given 03/05/14 1706)  pantoprazole (PROTONIX) EC tablet 40 mg (40 mg Oral Given 03/05/14 1610)  diltiazem (CARDIZEM CD) 24 hr capsule 240 mg (240 mg Oral Given 03/05/14 1704)  DULoxetine (CYMBALTA) DR capsule 60 mg (not administered)  flecainide (TAMBOCOR) tablet 100 mg (100 mg Oral Given 03/05/14 1706)  hydrochlorothiazide (HYDRODIURIL) tablet 25 mg (25 mg Oral Given 03/05/14 1705)  metoprolol tartrate (LOPRESSOR) tablet 50 mg (50 mg Oral Given 03/05/14 1610)  rivaroxaban (XARELTO) tablet 20 mg (20 mg Oral Given 03/05/14 1708)  traZODone (DESYREL) tablet 50 mg (not administered)  potassium chloride SA (K-DUR,KLOR-CON) CR tablet 20 mEq (20 mEq Oral Given 03/05/14 1610)  potassium chloride SA (K-DUR,KLOR-CON) CR tablet 40 mEq (40 mEq Oral Given 03/05/14 1702)    Labs Review Labs Reviewed  CBC - Abnormal; Notable for the following:    RDW 15.6 (*)    Platelets 404 (*)    All other components within normal limits  COMPREHENSIVE METABOLIC PANEL - Abnormal; Notable for the following:    Potassium 2.5 (*)    Chloride 94 (*)    Glucose, Bld 129 (*)    Total Protein 8.6 (*)    All other components within normal limits  SALICYLATE LEVEL - Abnormal; Notable for the following:    Salicylate Lvl <7.6 (*)    All other components within normal limits  POTASSIUM - Abnormal; Notable for the following:    Potassium 3.1 (*)    All other components within normal limits  ACETAMINOPHEN LEVEL  ETHANOL  URINE RAPID DRUG SCREEN (HOSP PERFORMED)  PROTIME-INR  I-STAT TROPOININ, ED     Imaging Review No results found.   EKG Interpretation   Date/Time:  Wednesday March 05 2014 13:05:18 EDT Ventricular Rate:  132 PR Interval:  84 QRS Duration: 88 QT Interval:  313 QTC Calculation: 464 R Axis:   -2 Text Interpretation:  Sinus tachycardia Repol abnrm suggests ischemia,  diffuse leads borderline ST/T changes are new Confirmed by GOLDSTON  MD,  SCOTT ([id number]) on 03/05/2014 2:31:48 PM      MDM   Final diagnoses:  Sustained SVT  Depression  Suicidal ideation   Patient is a 58 y.o. Female who presents to the ED with suicidal ideations.  Patient was found to be tachycardic on examination but further chart review reveals the patient is being treated for sustained SVT and patient had not taken morning medications.  Patient is asymptomatic cardiacwise.  Physical exam is unremarkable.  Labs reveal hypokalemia which was replaced here in the ED.  Repeat BMP shows improved hypokalemia.  Patient has already been seen by TTS.  Patient currently awaiting bed at [facility].  Patient medically cleared at this time.  Dr. Regenia Skeeter aware of tachycardia and improvement.    I personally performed the services described in this documentation, which was scribed in my presence. The recorded information has been reviewed and is accurate.    Cherylann Parr, PA-C 03/05/14 2059

## 2014-03-05 NOTE — Consult Note (Signed)
[facility] Face-to-Face Psychiatry Consult   Reason for Consult:  Suicidal ideation and depression Referring Physician:  [name]  [name] White is an 58 y.o. female. Total Time spent with patient: 30 minutes  Assessment: AXIS I:  Major Depression, Recurrent severe AXIS II:  Deferred AXIS III:   Past Medical History  Diagnosis Date  . Asthma   . Hypertension   . GERD (gastroesophageal reflux disease)   . Anxiety   . Depression   . Family history of anesthesia complication     "daughter; causes her to pass out afterwards"  . Pulmonary embolism 12/28/2013    "2 clots in each lung"  . Hyperthyroidism   . Migraine     "monthly" (12/28/2013)  . Osteoarthritis     "both knees; back of my neck; right pelvic bone" (12/28/2013)  . Chronic lower back pain    AXIS IV:  other psychosocial or environmental problems AXIS V:  11-20 some danger of hurting self or others possible OR occasionally fails to maintain minimal personal hygiene OR gross impairment in communication  Plan:  Recommend psychiatric Inpatient admission when medically cleared.  Subjective:   [name] White is a 58 y.o. female patient.  HPI:  Patient states that she has been having suicidal thoughts with no specific plan.  Patient states that she is feeling hopeless, overwhelmed, I can't sleep, I can't eat and I just want to die instead of trying to go through this a lone and do this alone; I have been out of work because of multiple medical conditions; I just lost my apartment, my car broke down, going through this heart thing and recently told I have a clotting dis order."  Patient states that she graduated with her masters degree in October. "Now look at me."  Patient continues to endorse suicidal ideation; but denies homicidal ideation, psychosis,and paranoia  HPI Elements:   Location:  Depression. Quality:  worsening depression. Severity:  suicidal thoughts. Timing:  several days. Review of Systems  HENT: Negative.    Respiratory: Cough: Denies. Shortness of breath: Denies. Wheezing: Denies.        History of Asthma   Cardiovascular:       History of clots and chest pain.   Musculoskeletal: Negative.   Psychiatric/Behavioral: Positive for depression and suicidal ideas. Negative for hallucinations, memory loss and substance abuse. The patient is nervous/anxious and has insomnia.    Family History  Problem Relation Age of Onset  . Osteoarthritis Mother   . Asthma Mother   . Heart failure Mother     Past Psychiatric History: Past Medical History  Diagnosis Date  . Asthma   . Hypertension   . GERD (gastroesophageal reflux disease)   . Anxiety   . Depression   . Family history of anesthesia complication     "daughter; causes her to pass out afterwards"  . Pulmonary embolism 12/28/2013    "2 clots in each lung"  . Hyperthyroidism   . Migraine     "monthly" (12/28/2013)  . Osteoarthritis     "both knees; back of my neck; right pelvic bone" (12/28/2013)  . Chronic lower back pain     reports that she quit smoking about 14 years ago. Her smoking use included Cigarettes. She has a 5 pack-year smoking history. She has never used smokeless tobacco. She reports that she drinks alcohol. She reports that she does not use illicit drugs. Family History  Problem Relation Age of Onset  . Osteoarthritis Mother   .  Asthma Mother   . Heart failure Mother            Allergies:   Allergies  Allergen Reactions  . Caffeine Other (See Comments)    Migraine  . Ciprofloxacin Hives  . Lisinopril Cough  . Vicodin [Hydrocodone-Acetaminophen] Nausea And Vomiting and Other (See Comments)    Headache   . Erythromycin Hives  . Oxycodone Nausea And Vomiting    headache  . Sulfamethoxazole-Trimethoprim Rash    ACT Assessment Complete:  Yes:    Educational Status    Risk to Self: Risk to self with the past 6 months Is patient at risk for suicide?: Yes Substance abuse history and/or treatment for substance  abuse?: No  Risk to Others:    Abuse:    Prior Inpatient Therapy:    Prior Outpatient Therapy:    Additional Information:                    Objective: Blood pressure 115/49, pulse 92, temperature 98.2 F (36.8 C), temperature source Oral, resp. rate 19, SpO2 95.00%.There is no weight on file to calculate BMI. Results for orders placed during the hospital encounter of 03/05/14 (from the past 72 hour(s))  ACETAMINOPHEN LEVEL     Status: None   Collection Time    03/05/14  1:21 PM      Result Value Ref Range   Acetaminophen (Tylenol), Serum <15.0  10 - 30 ug/mL   Comment:            THERAPEUTIC CONCENTRATIONS VARY     SIGNIFICANTLY. A RANGE OF 10-30     ug/mL MAY BE AN EFFECTIVE     CONCENTRATION FOR MANY PATIENTS.     HOWEVER, SOME ARE BEST TREATED     AT CONCENTRATIONS OUTSIDE THIS     RANGE.     ACETAMINOPHEN CONCENTRATIONS     >150 ug/mL AT 4 HOURS AFTER     INGESTION AND >50 ug/mL AT 12     HOURS AFTER INGESTION ARE     OFTEN ASSOCIATED WITH TOXIC     REACTIONS.  CBC     Status: Abnormal   Collection Time    03/05/14  1:21 PM      Result Value Ref Range   WBC 9.2  4.0 - 10.5 K/uL   RBC 4.70  3.87 - 5.11 MIL/uL   Hemoglobin 14.1  12.0 - 15.0 g/dL   HCT 42.9  36.0 - 46.0 %   MCV 91.3  78.0 - 100.0 fL   MCH 30.0  26.0 - 34.0 pg   MCHC 32.9  30.0 - 36.0 g/dL   RDW 15.6 (*) 11.5 - 15.5 %   Platelets 404 (*) 150 - 400 K/uL  COMPREHENSIVE METABOLIC PANEL     Status: Abnormal   Collection Time    03/05/14  1:21 PM      Result Value Ref Range   Sodium 137  137 - 147 mEq/L   Potassium 2.5 (*) 3.7 - 5.3 mEq/L   Comment: CRITICAL RESULT CALLED TO, READ BACK BY AND VERIFIED WITH:     WEST STACEY RN 03/05/2014 1448 BY BOVELL,T.   Chloride 94 (*) 96 - 112 mEq/L   CO2 28  19 - 32 mEq/L   Glucose, Bld 129 (*) 70 - 99 mg/dL   BUN 16  6 - 23 mg/dL   Creatinine, Ser 0.71  0.50 - 1.10 mg/dL   Calcium 9.7  8.4 - 10.5 mg/dL   Total  Protein 8.6 (*) 6.0 - 8.3 g/dL    Albumin 3.5  3.5 - 5.2 g/dL   AST 15  0 - 37 U/L   ALT 14  0 - 35 U/L   Alkaline Phosphatase 82  39 - 117 U/L   Total Bilirubin 0.3  0.3 - 1.2 mg/dL   GFR calc non Af Amer >90  >90 mL/min   GFR calc Af Amer >90  >90 mL/min   Comment: (NOTE)     The eGFR has been calculated using the CKD EPI equation.     This calculation has not been validated in all clinical situations.     eGFR's persistently <90 mL/min signify possible Chronic Kidney     Disease.   Anion gap 15  5 - 15  ETHANOL     Status: None   Collection Time    03/05/14  1:21 PM      Result Value Ref Range   Alcohol, Ethyl (B) <11  0 - 11 mg/dL   Comment:            LOWEST DETECTABLE LIMIT FOR     SERUM ALCOHOL IS 11 mg/dL     FOR MEDICAL PURPOSES ONLY  SALICYLATE LEVEL     Status: Abnormal   Collection Time    03/05/14  1:21 PM      Result Value Ref Range   Salicylate Lvl <3.5 (*) 2.8 - 20.0 mg/dL  PROTIME-INR     Status: None   Collection Time    03/05/14  1:21 PM      Result Value Ref Range   Prothrombin Time 12.3  11.6 - 15.2 seconds   INR 0.91  0.00 - 1.49  I-STAT TROPOININ, ED     Status: None   Collection Time    03/05/14  1:30 PM      Result Value Ref Range   Troponin i, poc 0.02  0.00 - 0.08 ng/mL   Comment 3            Comment: Due to the release kinetics of cTnI,     a negative result within the first hours     of the onset of symptoms does not rule out     myocardial infarction with certainty.     If myocardial infarction is still suspected,     repeat the test at appropriate intervals.  URINE RAPID DRUG SCREEN (HOSP PERFORMED)     Status: None   Collection Time    03/05/14  1:57 PM      Result Value Ref Range   Opiates NONE DETECTED  NONE DETECTED   Cocaine NONE DETECTED  NONE DETECTED   Benzodiazepines NONE DETECTED  NONE DETECTED   Amphetamines NONE DETECTED  NONE DETECTED   Tetrahydrocannabinol NONE DETECTED  NONE DETECTED   Barbiturates NONE DETECTED  NONE DETECTED   Comment:             DRUG SCREEN FOR MEDICAL PURPOSES     ONLY.  IF CONFIRMATION IS NEEDED     FOR ANY PURPOSE, NOTIFY LAB     WITHIN 5 DAYS.                LOWEST DETECTABLE LIMITS     FOR URINE DRUG SCREEN     Drug Class       Cutoff (ng/mL)     Amphetamine      1000     Barbiturate      200  Benzodiazepine   814     Tricyclics       481     Opiates          300     Cocaine          300     THC              50   Labs are reviewed see above values. Medications reviewed and no changes made  Current Facility-Administered Medications  Medication Dose Route Frequency Provider Last Rate Last Dose  . acetaminophen (TYLENOL) tablet 650 mg  650 mg Oral Q4H PRN Courtney A Forcucci, PA-C      . albuterol (PROVENTIL HFA;VENTOLIN HFA) 108 (90 BASE) MCG/ACT inhaler 2 puff  2 puff Inhalation Q6H PRN Courtney A Forcucci, PA-C      . albuterol (PROVENTIL) (2.5 MG/3ML) 0.083% nebulizer solution 2.5 mg  2.5 mg Nebulization Q6H PRN Courtney A Forcucci, PA-C      . ARIPiprazole (ABILIFY) tablet 2 mg  2 mg Oral Daily Courtney A Forcucci, PA-C      . budesonide-formoterol (SYMBICORT) 160-4.5 MCG/ACT inhaler 2 puff  2 puff Inhalation BID Courtney A Forcucci, PA-C      . diltiazem (CARDIZEM CD) 24 hr capsule 240 mg  240 mg Oral Daily Courtney A Forcucci, PA-C      . diphenhydrAMINE (BENADRYL) capsule 50 mg  50 mg Oral Q6H PRN Josalyn C Funches, MD   50 mg at 01/06/12 1458  . DULoxetine (CYMBALTA) DR capsule 60 mg  60 mg Oral QHS Courtney A Forcucci, PA-C      . flecainide (TAMBOCOR) tablet 100 mg  100 mg Oral Q12H Courtney A Forcucci, PA-C      . hydrochlorothiazide (HYDRODIURIL) tablet 25 mg  25 mg Oral Daily Courtney A Forcucci, PA-C      . ibuprofen (ADVIL,MOTRIN) tablet 600 mg  600 mg Oral Q8H PRN Courtney A Forcucci, PA-C      . ketotifen (ZADITOR) 0.025 % ophthalmic solution 1 drop  1 drop Both Eyes BID PRN Courtney A Forcucci, PA-C      . loratadine (CLARITIN) tablet 10 mg  10 mg Oral Daily Courtney A Forcucci, PA-C       . metoprolol tartrate (LOPRESSOR) tablet 50 mg  50 mg Oral BID Courtney A Forcucci, PA-C   50 mg at 03/05/14 1610  . ondansetron (ZOFRAN) tablet 4 mg  4 mg Oral Q8H PRN Courtney A Forcucci, PA-C      . pantoprazole (PROTONIX) EC tablet 40 mg  40 mg Oral Daily Courtney A Forcucci, PA-C   40 mg at 03/05/14 1610  . potassium chloride SA (K-DUR,KLOR-CON) CR tablet 20 mEq  20 mEq Oral BID Courtney A Forcucci, PA-C   20 mEq at 03/05/14 1610  . rivaroxaban (XARELTO) tablet 20 mg  20 mg Oral Q supper Courtney A Forcucci, PA-C      . traZODone (DESYREL) tablet 50 mg  50 mg Oral QHS PRN Courtney A Forcucci, PA-C      . zolpidem (AMBIEN) tablet 5 mg  5 mg Oral QHS PRN Cherylann Parr, PA-C       Current Outpatient Prescriptions  Medication Sig Dispense Refill  . albuterol (PROVENTIL HFA;VENTOLIN HFA) 108 (90 BASE) MCG/ACT inhaler Inhale 2 puffs into the lungs every 6 (six) hours as needed. For shortness of breath      . albuterol (PROVENTIL) (2.5 MG/3ML) 0.083% nebulizer solution Take 3 mLs (2.5 mg total) by nebulization every  6 (six) hours as needed. For shortness of breath  75 mL  12  . ARIPiprazole (ABILIFY) 2 MG tablet Take 2 mg by mouth at bedtime.       Marland Kitchen azelastine (OPTIVAR) 0.05 % ophthalmic solution Place 1 drop into both eyes 2 (two) times daily as needed. For dry/irritated eyes      . budesonide-formoterol (SYMBICORT) 160-4.5 MCG/ACT inhaler Inhale 2 puffs into the lungs 2 (two) times daily.        . cetirizine (ZYRTEC) 10 MG tablet Take 1 tablet (10 mg total) by mouth every morning.  30 tablet  11  . dexlansoprazole (DEXILANT) 60 MG capsule Take 60 mg by mouth every morning.       . diclofenac sodium (VOLTAREN) 1 % GEL Apply 2 g topically 2 (two) times daily as needed (For pain or inflammation on knees.).       Marland Kitchen diltiazem (CARDIZEM CD) 240 MG 24 hr capsule Take 240 mg by mouth every morning.      . DULoxetine (CYMBALTA) 60 MG capsule Take 60 mg by mouth at bedtime.       . flecainide  (TAMBOCOR) 100 MG tablet Take 1 tablet (100 mg total) by mouth every 12 (twelve) hours.  180 tablet  1  . hydrochlorothiazide (HYDRODIURIL) 25 MG tablet Take 25 mg by mouth every morning.      . metoprolol (LOPRESSOR) 50 MG tablet Take 1 tablet (50 mg total) by mouth 2 (two) times daily.  60 tablet  3  . Olopatadine HCl 0.6 % SOLN Place 1 puff into the nose at bedtime as needed (For seasonal allergies.).       Marland Kitchen rivaroxaban (XARELTO) 20 MG TABS tablet Take 1 tablet (20 mg total) by mouth daily with supper.  30 tablet  3  . traZODone (DESYREL) 50 MG tablet Take 50 mg by mouth at bedtime as needed for sleep.         Psychiatric Specialty Exam:     Blood pressure 115/49, pulse 92, temperature 98.2 F (36.8 C), temperature source Oral, resp. rate 19, SpO2 95.00%.There is no weight on file to calculate BMI.  General Appearance: Casual and Fairly Groomed  Eye Contact::  Good  Speech:  Blocked and Normal Rate  Volume:  Normal  Mood:  Depressed and Hopeless  Affect:  Congruent, Depressed and Tearful  Thought Process:  Circumstantial and Goal Directed  Orientation:  Full (Time, Place, and Person)  Thought Content:  Rumination  Suicidal Thoughts:  Yes.  without intent/plan  Homicidal Thoughts:  No  Memory:  Immediate;   Good Recent;   Good Remote;   Good  Judgement:  Fair  Insight:  Present  Psychomotor Activity:  Normal  Concentration:  Fair  Recall:  Good  Fund of Knowledge:Good  Language: Good  Akathisia:  No  Handed:  Right  AIMS (if indicated):     Assets:  Communication Skills Desire for Improvement Housing Social Support  Sleep:      Musculoskeletal: Strength & Muscle Tone: within normal limits Gait & Station: normal Patient leans: N/A  Treatment Plan Summary: Daily contact with patient to assess and evaluate symptoms and progress in treatment Medication management Inpatient treatment recommended  Krysten Veronica, FNP-BC 03/05/2014 4:20 PM

## 2014-03-05 NOTE — ED Notes (Addendum)
Pt c/o increased anxiety, depression, and SI w/ plan to OD x 3 days.  Denies HI and hallucinations.  Pt reports "I lost everything I had last month.  I lost my apartment, because I got sick and rent was last.  My car broke down."  Sts she goes to [facility] and has been taking medications as prescribed.  Pt denies medical complaints, but sts she was recently admitted for heart issues.  Chart review shows Pt was recently diagnosed w/ narrow complex tachycardia.  Pt reports she is on 4 cardiac medications, but has not taken them today.      Pt has been seen by TTS and In-Patient treatment is recommended.  [place] does not have any open beds.

## 2014-03-05 NOTE — Progress Notes (Signed)
Writer faxed in result of the Potassium level to [facility], called to give report on the patient.  Spoke with intake Supervisor, Caryl Pina; she said the Potassium level is still too low for them to accept the patient and that the level must be at least 3.2 for them to accept her. Writer notified the NP on call. She said she'll pull patient profile up and let writer know what to do.

## 2014-03-05 NOTE — ED Notes (Signed)
Patient is resting comfortably. 

## 2014-03-05 NOTE — ED Notes (Signed)
Patient denies pain and is resting comfortably.  

## 2014-03-06 NOTE — Consult Note (Signed)
Face to face evaluation and I agree with this note 

## 2014-03-06 NOTE — ED Notes (Signed)
Writer called report to [facility]. Reviewed instructions with patient. Patient transferred to [place] via [employer].

## 2014-03-10 NOTE — ED Provider Notes (Signed)
Medical screening examination/treatment/procedure(s) were conducted as a shared visit with non-physician practitioner(s) and myself.  I personally evaluated the patient during the encounter.   EKG Interpretation   Date/Time:  Wednesday March 05 2014 15:18:56 EDT Ventricular Rate:  90 PR Interval:  204 QRS Duration: 87 QT Interval:  354 QTC Calculation: 433 R Axis:   27 Text Interpretation:  Sinus rhythm Borderline prolonged PR interval  Probable left atrial enlargement Borderline T abnormalities, diffuse leads  ED PHYSICIAN INTERPRETATION AVAILABLE IN [facility] Confirmed by  TEST, Record ([id number]) on 03/07/2014 6:53:12 AM       Patient voluntarily here for SI. Also noted to be tachycardic, no symptoms. Resolved without intervention. Hx of paroxysmal SVT noted last month. Will restart home meds and get psych consult.  Ephraim Hamburger, MD 03/10/14 [phone]

## 2014-03-25 ENCOUNTER — Encounter: Payer: Medicare HMO | Admitting: Internal Medicine

## 2014-04-02 ENCOUNTER — Telehealth: Payer: Self-pay | Admitting: Internal Medicine

## 2014-04-02 NOTE — Telephone Encounter (Signed)
Spoke with [name] White and let the patient know that she should take her BS and see if they are low.  She does not and has never checked her sugar.  She does not have DM.  Her pharmacist told her that Metoprolol may lower her BS.  i have asked her to keep a log of the symptoms as to when they occur and bring with her to her appointment

## 2014-04-02 NOTE — Telephone Encounter (Signed)
New message  Pt called requests a call back to discuss medication.. States that she is experiencing trembling. Please call

## 2014-04-12 ENCOUNTER — Other Ambulatory Visit: Payer: Self-pay | Admitting: Family Medicine

## 2014-04-24 ENCOUNTER — Encounter: Payer: Self-pay | Admitting: Internal Medicine

## 2014-04-24 ENCOUNTER — Ambulatory Visit (INDEPENDENT_AMBULATORY_CARE_PROVIDER_SITE_OTHER): Payer: Medicare HMO | Admitting: Internal Medicine

## 2014-04-24 VITALS — BP 130/72 | HR 55 | Ht 65.0 in | Wt 261.0 lb

## 2014-04-24 DIAGNOSIS — E669 Obesity, unspecified: Secondary | ICD-10-CM

## 2014-04-24 DIAGNOSIS — I10 Essential (primary) hypertension: Secondary | ICD-10-CM

## 2014-04-24 DIAGNOSIS — I471 Supraventricular tachycardia: Secondary | ICD-10-CM

## 2014-04-24 MED ORDER — METOPROLOL TARTRATE 50 MG PO TABS: 50.0000 mg | ORAL_TABLET | Freq: Two times a day (BID) | ORAL | Status: DC

## 2014-04-24 MED ORDER — RIVAROXABAN 20 MG PO TABS: 20.0000 mg | ORAL_TABLET | Freq: Every day | ORAL | Status: DC

## 2014-04-24 MED ORDER — HYDROCHLOROTHIAZIDE 25 MG PO TABS: 25.0000 mg | ORAL_TABLET | Freq: Every morning | ORAL | Status: DC

## 2014-04-24 MED ORDER — FLECAINIDE ACETATE 100 MG PO TABS: 100.0000 mg | ORAL_TABLET | Freq: Two times a day (BID) | ORAL | Status: DC

## 2014-04-24 NOTE — Patient Instructions (Signed)
Your physician recommends that you schedule a follow-up appointment in: 4 months with Dr Lovena Le   Your physician has recommended you make the following change in your medication:  1) Stop Cardizem

## 2014-04-24 NOTE — Assessment & Plan Note (Signed)
Her symptoms have been well-controlled on medical therapy. I've asked the patient to discontinue her Cardizem today. If her blood pressure increases, or her SVT returns, she will need to restart this medication.

## 2014-04-24 NOTE — Progress Notes (Signed)
HPI [name] returns today for followup. She is a very pleasant 58 year old woman with a history of recurrent SVT, hypertension, and COPD. She was in the hospital with SVT several weeks ago, which eventually settled down with medical therapy. She returns today for followup. She has lost over 25 lbs. She has not had more SVT. No chest pain.  Allergies  Allergen Reactions  . Caffeine Other (See Comments)    Migraine  . Ciprofloxacin Hives  . Lisinopril Cough  . Vicodin [Hydrocodone-Acetaminophen] Nausea And Vomiting and Other (See Comments)    Headache   . Erythromycin Hives  . Oxycodone Nausea And Vomiting    headache  . Sulfamethoxazole-Trimethoprim Rash     Current Outpatient Prescriptions  Medication Sig Dispense Refill  . albuterol (PROVENTIL HFA;VENTOLIN HFA) 108 (90 BASE) MCG/ACT inhaler Inhale 2 puffs into the lungs every 6 (six) hours as needed. For shortness of breath      . albuterol (PROVENTIL) (2.5 MG/3ML) 0.083% nebulizer solution Take 3 mLs (2.5 mg total) by nebulization every 6 (six) hours as needed. For shortness of breath  75 mL  12  . ARIPiprazole (ABILIFY) 2 MG tablet Take 2 mg by mouth at bedtime.       Marland Kitchen azelastine (OPTIVAR) 0.05 % ophthalmic solution Place 1 drop into both eyes 2 (two) times daily as needed. For dry/irritated eyes      . budesonide-formoterol (SYMBICORT) 160-4.5 MCG/ACT inhaler Inhale 2 puffs into the lungs 2 (two) times daily.        . cetirizine (ZYRTEC) 10 MG tablet take 1 tablet by mouth every morning  30 tablet  6  . dexlansoprazole (DEXILANT) 60 MG capsule Take 60 mg by mouth every morning.       . diclofenac sodium (VOLTAREN) 1 % GEL Apply 2 g topically 2 (two) times daily as needed (For pain or inflammation on knees.).       Marland Kitchen diltiazem (CARDIZEM CD) 240 MG 24 hr capsule Take 240 mg by mouth every morning.      . DULoxetine (CYMBALTA) 60 MG capsule Take 60 mg by mouth at bedtime.       . flecainide (TAMBOCOR) 100 MG tablet Take  1 tablet (100 mg total) by mouth every 12 (twelve) hours.  180 tablet  1  . hydrochlorothiazide (HYDRODIURIL) 25 MG tablet Take 25 mg by mouth every morning.      . metoprolol (LOPRESSOR) 50 MG tablet Take 1 tablet (50 mg total) by mouth 2 (two) times daily.  60 tablet  3  . Olopatadine HCl 0.6 % SOLN Place 1 puff into the nose at bedtime as needed (For seasonal allergies.).       Marland Kitchen zolpidem (AMBIEN) 10 MG tablet Take 10 mg by mouth at bedtime as needed for sleep.      . rivaroxaban (XARELTO) 20 MG TABS tablet Take 1 tablet (20 mg total) by mouth daily with supper.  30 tablet  3   Current Facility-Administered Medications  Medication Dose Route Frequency Provider Last Rate Last Dose  . diphenhydrAMINE (BENADRYL) capsule 50 mg  50 mg Oral Q6H PRN Minerva Ends, MD   50 mg at 01/06/12 1458     Past Medical History  Diagnosis Date  . Asthma   . Hypertension   . GERD (gastroesophageal reflux disease)   . Anxiety   . Depression   . Family history of anesthesia complication     "daughter; causes her to pass out afterwards"  .  Pulmonary embolism 12/28/2013    "2 clots in each lung"  . Hyperthyroidism   . Migraine     "monthly" (12/28/2013)  . Osteoarthritis     "both knees; back of my neck; right pelvic bone" (12/28/2013)  . Chronic lower back pain     ROS:   All systems reviewed and negative except as noted in the HPI.   Past Surgical History  Procedure Laterality Date  . Abdominal hysterectomy    . Wisdom tooth extraction    . Thyroidectomy, partial Right 2005  . Nasal sinus surgery  2007  . Appendectomy    . Excisional hemorrhoidectomy    . Dilation and curettage of uterus    . Tubal ligation    . Breast cyst excision Right      Family History  Problem Relation Age of Onset  . Osteoarthritis Mother   . Asthma Mother   . Heart failure Mother      History   Social History  . Marital Status: Single    Spouse Name: N/A    Number of Children: N/A  . Years of  Education: N/A   Occupational History  . Not on file.   Social History Main Topics  . Smoking status: Former Smoker -- 0.25 packs/day for 20 years    Types: Cigarettes    Quit date: 07/17/1999  . Smokeless tobacco: Never Used  . Alcohol Use: Yes     Comment: "drank some in my 30's"  . Drug Use: No  . Sexual Activity: No   Other Topics Concern  . Not on file   Social History Narrative  . No narrative on file     BP 130/72  Pulse 55  Ht 5\' 5"  (1.651 m)  Wt 261 lb (118.389 kg)  BMI 43.43 kg/m2  Physical Exam:  Well appearing 58 yo woman, NAD HEENT: Unremarkable Neck:  No JVD, no thyromegally Back:  No CVA tenderness Lungs:  Clear with no wheezes HEART:  Regular rate rhythm, no murmurs, no rubs, no clicks Abd:  soft, positive bowel sounds, no organomegally, no rebound, no guarding Ext:  2 plus pulses, no edema, no cyanosis, no clubbing Skin:  No rashes no nodules Neuro:  CN II through XII intact, motor grossly intact  EKG - nsr with nonspecific T wave abnormality.   Assess/Plan:

## 2014-04-24 NOTE — Assessment & Plan Note (Signed)
Her blood pressure is well controlled. Hopefully it will remain so with discontinuation of her Cardizem. She is encouraged to maintain a low-sodium diet.

## 2014-04-24 NOTE — Assessment & Plan Note (Signed)
She is lost over 25 pounds. She is dieting and exercising. She is encouraged to continue.

## 2014-07-31 ENCOUNTER — Emergency Department (INDEPENDENT_AMBULATORY_CARE_PROVIDER_SITE_OTHER)
Admission: EM | Admit: 2014-07-31 | Discharge: 2014-07-31 | Disposition: A | Discharge status: Home / Self Care | Payer: Medicare HMO | Source: Home / Self Care | Attending: Emergency Medicine | Admitting: Emergency Medicine

## 2014-07-31 ENCOUNTER — Encounter (HOSPITAL_COMMUNITY): Payer: Self-pay | Admitting: Emergency Medicine

## 2014-07-31 DIAGNOSIS — M549 Dorsalgia, unspecified: Secondary | ICD-10-CM

## 2014-07-31 DIAGNOSIS — M129 Arthropathy, unspecified: Secondary | ICD-10-CM

## 2014-07-31 DIAGNOSIS — M545 Low back pain, unspecified: Secondary | ICD-10-CM

## 2014-07-31 DIAGNOSIS — M170 Bilateral primary osteoarthritis of knee: Secondary | ICD-10-CM

## 2014-07-31 MED ORDER — PREDNISONE 20 MG PO TABS: ORAL_TABLET | ORAL | Status: DC

## 2014-07-31 MED ORDER — CYCLOBENZAPRINE HCL 5 MG PO TABS: 5.0000 mg | ORAL_TABLET | Freq: Three times a day (TID) | ORAL | Status: DC | PRN

## 2014-07-31 NOTE — ED Notes (Signed)
C/o chronic pain and stiffness involving lower back and into both legs.  Patient sees dr Noemi Chapel and unable to get appt as soon as needed.  Recently moved to [place].

## 2014-07-31 NOTE — ED Provider Notes (Signed)
CSN: [id number]     Arrival date & time 07/31/14  1309 History   First MD Initiated Contact with Patient 07/31/14 1345     Chief Complaint  Patient presents with  . Pain   (Consider location/radiation/quality/duration/timing/severity/associated sxs/prior Treatment) HPI Comments: 59 year old female complaining of acute on chronic back pain and chronic bilateral knee pain. Recently she was moving boxes and furniture from one house to another and developed para lumbar and parathoracic muscle pain. Denies radiation of pain. Denies symptoms of sciatica. She complains of chronic bilateral knee pain that has been diagnosed as arthritis. She has a doctor for which she sees for arthritis a regular basis. She is in the urgent care today for primarily the acute back pain which is myalgia due to overuse and lifting in the past few days. She denies being on chronic pain medications.   Past Medical History  Diagnosis Date  . Asthma   . Hypertension   . GERD (gastroesophageal reflux disease)   . Anxiety   . Depression   . Family history of anesthesia complication     "daughter; causes her to pass out afterwards"  . Pulmonary embolism 12/28/2013    "2 clots in each lung"  . Hyperthyroidism   . Migraine     "monthly" (12/28/2013)  . Osteoarthritis     "both knees; back of my neck; right pelvic bone" (12/28/2013)  . Chronic lower back pain    Past Surgical History  Procedure Laterality Date  . Abdominal hysterectomy    . Wisdom tooth extraction    . Thyroidectomy, partial Right 2005  . Nasal sinus surgery  2007  . Appendectomy    . Excisional hemorrhoidectomy    . Dilation and curettage of uterus    . Tubal ligation    . Breast cyst excision Right    Family History  Problem Relation Age of Onset  . Osteoarthritis Mother   . Asthma Mother   . Heart failure Mother    History  Substance Use Topics  . Smoking status: Former Smoker -- 0.25 packs/day for 20 years    Types: Cigarettes    Quit  date: 07/17/1999  . Smokeless tobacco: Never Used  . Alcohol Use: Yes     Comment: "drank some in my 30's"   OB History    No data available     Review of Systems  Constitutional: Positive for activity change. Negative for fever, chills and appetite change.  HENT: Negative.   Respiratory: Negative.   Cardiovascular: Negative for chest pain.  Genitourinary: Negative.   Musculoskeletal: Positive for myalgias and back pain. Negative for neck pain.       As per HPI  Skin: Negative for color change, pallor and rash.  Neurological: Negative.   Psychiatric/Behavioral: Negative.     Allergies  Caffeine; Ciprofloxacin; Lisinopril; Vicodin; Erythromycin; Oxycodone; and Sulfamethoxazole-trimethoprim  Home Medications   Prior to Admission medications   Medication Sig Start Date End Date Taking? Authorizing Provider  albuterol (PROVENTIL HFA;VENTOLIN HFA) 108 (90 BASE) MCG/ACT inhaler Inhale 2 puffs into the lungs every 6 (six) hours as needed. For shortness of breath    Historical Provider, MD  albuterol (PROVENTIL) (2.5 MG/3ML) 0.083% nebulizer solution Take 3 mLs (2.5 mg total) by nebulization every 6 (six) hours as needed. For shortness of breath 04/07/13   Hilton Sinclair, MD  ARIPiprazole (ABILIFY) 2 MG tablet Take 2 mg by mouth at bedtime.     Historical Provider, MD  azelastine (OPTIVAR) 0.05 %  ophthalmic solution Place 1 drop into both eyes 2 (two) times daily as needed. For dry/irritated eyes    Historical Provider, MD  budesonide-formoterol (SYMBICORT) 160-4.5 MCG/ACT inhaler Inhale 2 puffs into the lungs 2 (two) times daily.      Historical Provider, MD  cetirizine (ZYRTEC) 10 MG tablet take 1 tablet by mouth every morning 04/14/14   Renee A Kuneff, DO  cyclobenzaprine (FLEXERIL) 5 MG tablet Take 1 tablet (5 mg total) by mouth 3 (three) times daily as needed for muscle spasms. 07/31/14   Janne Napoleon, NP  dexlansoprazole (DEXILANT) 60 MG capsule Take 60 mg by mouth every morning.      Historical Provider, MD  diclofenac sodium (VOLTAREN) 1 % GEL Apply 2 g topically 2 (two) times daily as needed (For pain or inflammation on knees.).     Historical Provider, MD  DULoxetine (CYMBALTA) 60 MG capsule Take 60 mg by mouth at bedtime.     Historical Provider, MD  flecainide (TAMBOCOR) 100 MG tablet Take 1 tablet (100 mg total) by mouth every 12 (twelve) hours. 04/24/14   Evans Lance, MD  hydrochlorothiazide (HYDRODIURIL) 25 MG tablet Take 1 tablet (25 mg total) by mouth every morning. 04/24/14   Evans Lance, MD  metoprolol (LOPRESSOR) 50 MG tablet Take 1 tablet (50 mg total) by mouth 2 (two) times daily. 04/24/14   Evans Lance, MD  Olopatadine HCl 0.6 % SOLN Place 1 puff into the nose at bedtime as needed (For seasonal allergies.).     Historical Provider, MD  predniSONE (DELTASONE) 20 MG tablet Take 3 tabs po on first day, 2 tabs second day, 2 tabs third day, 1 tab fourth day, 1 tab 5th day. Take with food. 07/31/14   Janne Napoleon, NP  rivaroxaban (XARELTO) 20 MG TABS tablet Take 1 tablet (20 mg total) by mouth daily with supper. 04/24/14 07/03/14  Evans Lance, MD  zolpidem (AMBIEN) 10 MG tablet Take 10 mg by mouth at bedtime as needed for sleep.    Historical Provider, MD   BP 124/81 mmHg  Pulse 67  Temp(Src) 98 F (36.7 C) (Oral)  Resp 16  SpO2 96% Physical Exam  Constitutional: She is oriented to person, place, and time. She appears well-developed and well-nourished.  Neck: Normal range of motion. Neck supple.  Pulmonary/Chest: Effort normal. No respiratory distress.  Musculoskeletal:  Tenderness along the parathoracic and paralumbar musculature. No spinal tenderness or deformity. Patient is able to stand and sit without assistance. She is able to slowly walk across the room but with some back pain but greater chronic bilateral knee pain.  Neurological: She is alert and oriented to person, place, and time.  Skin: Skin is warm and dry.  Psychiatric: She has a normal  mood and affect.  Nursing note and vitals reviewed.   ED Course  Procedures (including critical care time) Labs Review Labs Reviewed - No data to display  Imaging Review No results found.   MDM   1. Back pain without radiation   2. Arthritis of both knees   3. Bilateral low back pain without sciatica    Flexeril 5 mg 3 times a day when necessary Low-dose prednisone taper with 5 tablets. Take after you eat. And continue taking the ranitidine and Exelon daily for stomach protection. Instructions on low back pain. Follow-up with your arthritis doctor as scheduled.    Janne Napoleon, NP 07/31/14 1409

## 2014-07-31 NOTE — Discharge Instructions (Signed)
Back Pain, Adult °Low back pain is very common. About 1 in 5 people have back pain. The cause of low back pain is rarely dangerous. The pain often gets better over time. About half of people with a sudden onset of back pain feel better in just 2 weeks. About 8 in 10 people feel better by 6 weeks.  °CAUSES °Some common causes of back pain include: °· Strain of the muscles or ligaments supporting the spine. °· Wear and tear (degeneration) of the spinal discs. °· Arthritis. °· Direct injury to the back. °DIAGNOSIS °Most of the time, the direct cause of low back pain is not known. However, back pain can be treated effectively even when the exact cause of the pain is unknown. Answering your caregiver's questions about your overall health and symptoms is one of the most accurate ways to make sure the cause of your pain is not dangerous. If your caregiver needs more information, he or she may order lab work or imaging tests (X-rays or MRIs). However, even if imaging tests show changes in your back, this usually does not require surgery. °HOME CARE INSTRUCTIONS °For many people, back pain returns. Since low back pain is rarely dangerous, it is often a condition that people can learn to manage on their own.  °· Remain active. It is stressful on the back to sit or stand in one place. Do not sit, drive, or stand in one place for more than 30 minutes at a time. Take short walks on level surfaces as soon as pain allows. Try to increase the length of time you walk each day. °· Do not stay in bed. Resting more than 1 or 2 days can delay your recovery. °· Do not avoid exercise or work. Your body is made to move. It is not dangerous to be active, even though your back may hurt. Your back will likely heal faster if you return to being active before your pain is gone. °· Pay attention to your body when you  bend and lift. Many people have less discomfort when lifting if they bend their knees, keep the load close to their bodies, and  avoid twisting. Often, the most comfortable positions are those that put less stress on your recovering back. °· Find a comfortable position to sleep. Use a firm mattress and lie on your side with your knees slightly bent. If you lie on your back, put a pillow under your knees. °· Only take over-the-counter or prescription medicines as directed by your caregiver. Over-the-counter medicines to reduce pain and inflammation are often the most helpful. Your caregiver may prescribe muscle relaxant drugs. These medicines help dull your pain so you can more quickly return to your normal activities and healthy exercise. °· Put ice on the injured area. °¨ Put ice in a plastic bag. °¨ Place a towel between your skin and the bag. °¨ Leave the ice on for 15-20 minutes, 03-04 times a day for the first 2 to 3 days. After that, ice and heat may be alternated to reduce pain and spasms. °· Ask your caregiver about trying back exercises and gentle massage. This may be of some benefit. °· Avoid feeling anxious or stressed. Stress increases muscle tension and can worsen back pain. It is important to recognize when you are anxious or stressed and learn ways to manage it. Exercise is a great option. °SEEK MEDICAL CARE IF: °· You have pain that is not relieved with rest or medicine. °· You have pain that does not improve in 1 week. °· You have new symptoms. °· You are generally not feeling well. °SEEK   IMMEDIATE MEDICAL CARE IF:   You have pain that radiates from your back into your legs.  You develop new bowel or bladder control problems.  You have unusual weakness or numbness in your arms or legs.  You develop nausea or vomiting.  You develop abdominal pain.  You feel faint. Document Released: 06/13/2005 Document Revised: 12/13/2011 Document Reviewed: 10/15/2013 [facility] Patient Information 2015 [place], [place]. This information is not intended to replace advice given to you by your health care provider. Make sure you  discuss any questions you have with your health care provider.  Knee Pain The knee is the complex joint between your thigh and your lower leg. It is made up of bones, tendons, ligaments, and cartilage. The bones that make up the knee are:  The femur in the thigh.  The tibia and fibula in the lower leg.  The patella or kneecap riding in the groove on the lower femur. CAUSES  Knee pain is a common complaint with many causes. A few of these causes are:  Injury, such as:  A ruptured ligament or tendon injury.  Torn cartilage.  Medical conditions, such as:  Gout  Arthritis  Infections  Overuse, over training, or overdoing a physical activity. Knee pain can be minor or severe. Knee pain can accompany debilitating injury. Minor knee problems often respond well to self-care measures or get well on their own. More serious injuries may need medical intervention or even surgery. SYMPTOMS The knee is complex. Symptoms of knee problems can vary widely. Some of the problems are:  Pain with movement and weight bearing.  Swelling and tenderness.  Buckling of the knee.  Inability to straighten or extend your knee.  Your knee locks and you cannot straighten it.  Warmth and redness with pain and fever.  Deformity or dislocation of the kneecap. DIAGNOSIS  Determining what is wrong may be very straight forward such as when there is an injury. It can also be challenging because of the complexity of the knee. Tests to make a diagnosis may include:  Your caregiver taking a history and doing a physical exam.  Routine X-rays can be used to rule out other problems. X-rays will not reveal a cartilage tear. Some injuries of the knee can be diagnosed by:  Arthroscopy a surgical technique by which a small video camera is inserted through tiny incisions on the sides of the knee. This procedure is used to examine and repair internal knee joint problems. Tiny instruments can be used during  arthroscopy to repair the torn knee cartilage (meniscus).  Arthrography is a radiology technique. A contrast liquid is directly injected into the knee joint. Internal structures of the knee joint then become visible on X-ray film.  An MRI scan is a non X-ray radiology procedure in which magnetic fields and a computer produce two- or three-dimensional images of the inside of the knee. Cartilage tears are often visible using an MRI scanner. MRI scans have largely replaced arthrography in diagnosing cartilage tears of the knee.  Blood work.  Examination of the fluid that helps to lubricate the knee joint (synovial fluid). This is done by taking a sample out using a needle and a syringe. TREATMENT The treatment of knee problems depends on the cause. Some of these treatments are:  Depending on the injury, proper casting, splinting, surgery, or physical therapy care will be needed.  Give yourself adequate recovery time. Do not overuse your joints. If you begin to get sore during workout routines, back off.  Slow down or do fewer repetitions.  For repetitive activities such as cycling or running, maintain your strength and nutrition.  Alternate muscle groups. For example, if you are a weight lifter, work the upper body on one day and the lower body the next.  Either tight or weak muscles do not give the proper support for your knee. Tight or weak muscles do not absorb the stress placed on the knee joint. Keep the muscles surrounding the knee strong.  Take care of mechanical problems.  If you have flat feet, orthotics or special shoes may help. See your caregiver if you need help.  Arch supports, sometimes with wedges on the inner or outer aspect of the heel, can help. These can shift pressure away from the side of the knee most bothered by osteoarthritis.  A brace called an "unloader" brace also may be used to help ease the pressure on the most arthritic side of the knee.  If your caregiver has  prescribed crutches, braces, wraps or ice, use as directed. The acronym for this is PRICE. This means protection, rest, ice, compression, and elevation.  Nonsteroidal anti-inflammatory drugs (NSAIDs), can help relieve pain. But if taken immediately after an injury, they may actually increase swelling. Take NSAIDs with food in your stomach. Stop them if you develop stomach problems. Do not take these if you have a history of ulcers, stomach pain, or bleeding from the bowel. Do not take without your caregiver's approval if you have problems with fluid retention, heart failure, or kidney problems.  For ongoing knee problems, physical therapy may be helpful.  Glucosamine and chondroitin are over-the-counter dietary supplements. Both may help relieve the pain of osteoarthritis in the knee. These medicines are different from the usual anti-inflammatory drugs. Glucosamine may decrease the rate of cartilage destruction.  Injections of a corticosteroid drug into your knee joint may help reduce the symptoms of an arthritis flare-up. They may provide pain relief that lasts a few months. You may have to wait a few months between injections. The injections do have a small increased risk of infection, water retention, and elevated blood sugar levels.  Hyaluronic acid injected into damaged joints may ease pain and provide lubrication. These injections may work by reducing inflammation. A series of shots may give relief for as long as 6 months.  Topical painkillers. Applying certain ointments to your skin may help relieve the pain and stiffness of osteoarthritis. Ask your pharmacist for suggestions. Many over the-counter products are approved for temporary relief of arthritis pain.  In some countries, doctors often prescribe topical NSAIDs for relief of chronic conditions such as arthritis and tendinitis. A review of treatment with NSAID creams found that they worked as well as oral medications but without the serious  side effects. PREVENTION  Maintain a healthy weight. Extra pounds put more strain on your joints.  Get strong, stay limber. Weak muscles are a common cause of knee injuries. Stretching is important. Include flexibility exercises in your workouts.  Be smart about exercise. If you have osteoarthritis, chronic knee pain or recurring injuries, you may need to change the way you exercise. This does not mean you have to stop being active. If your knees ache after jogging or playing basketball, consider switching to swimming, water aerobics, or other low-impact activities, at least for a few days a week. Sometimes limiting high-impact activities will provide relief.  Make sure your shoes fit well. Choose footwear that is right for your sport.  Protect your knees. Use  the proper gear for knee-sensitive activities. Use kneepads when playing volleyball or laying carpet. Buckle your seat belt every time you drive. Most shattered kneecaps occur in car accidents.  Rest when you are tired. SEEK MEDICAL CARE IF:  You have knee pain that is continual and does not seem to be getting better.  SEEK IMMEDIATE MEDICAL CARE IF:  Your knee joint feels hot to the touch and you have a high fever. MAKE SURE YOU:   Understand these instructions.  Will watch your condition.  Will get help right away if you are not doing well or get worse. Document Released: 04/10/2007 Document Revised: 09/05/2011 Document Reviewed: 04/10/2007 [facility] Patient Information 2015 [place], [place]. This information is not intended to replace advice given to you by your health care provider. Make sure you discuss any questions you have with your health care provider.  Lumbosacral Strain Lumbosacral strain is a strain of any of the parts that make up your lumbosacral vertebrae. Your lumbosacral vertebrae are the bones that make up the lower third of your backbone. Your lumbosacral vertebrae are held together by muscles and tough, fibrous  tissue (ligaments).  CAUSES  A sudden blow to your back can cause lumbosacral strain. Also, anything that causes an excessive stretch of the muscles in the low back can cause this strain. This is typically seen when people exert themselves strenuously, fall, lift heavy objects, bend, or crouch repeatedly. RISK FACTORS  Physically demanding work.  Participation in pushing or pulling sports or sports that require a sudden twist of the back (tennis, golf, baseball).  Weight lifting.  Excessive lower back curvature.  Forward-tilted pelvis.  Weak back or abdominal muscles or both.  Tight hamstrings. SIGNS AND SYMPTOMS  Lumbosacral strain may cause pain in the area of your injury or pain that moves (radiates) down your leg.  DIAGNOSIS Your health care provider can often diagnose lumbosacral strain through a physical exam. In some cases, you may need tests such as X-ray exams.  TREATMENT  Treatment for your lower back injury depends on many factors that your clinician will have to evaluate. However, most treatment will include the use of anti-inflammatory medicines. HOME CARE INSTRUCTIONS   Avoid hard physical activities (tennis, racquetball, waterskiing) if you are not in proper physical condition for it. This may aggravate or create problems.  If you have a back problem, avoid sports requiring sudden body movements. Swimming and walking are generally safer activities.  Maintain good posture.  Maintain a healthy weight.  For acute conditions, you may put ice on the injured area.  Put ice in a plastic bag.  Place a towel between your skin and the bag.  Leave the ice on for 20 minutes, 2-3 times a day.  When the low back starts healing, stretching and strengthening exercises may be recommended. SEEK MEDICAL CARE IF:  Your back pain is getting worse.  You experience severe back pain not relieved with medicines. SEEK IMMEDIATE MEDICAL CARE IF:   You have numbness, tingling,  weakness, or problems with the use of your arms or legs.  There is a change in bowel or bladder control.  You have increasing pain in any area of the body, including your belly (abdomen).  You notice shortness of breath, dizziness, or feel faint.  You feel sick to your stomach (nauseous), are throwing up (vomiting), or become sweaty.  You notice discoloration of your toes or legs, or your feet get very cold. MAKE SURE YOU:   Understand these instructions.  Will watch your condition.  Will get help right away if you are not doing well or get worse. Document Released: 03/23/2005 Document Revised: 06/18/2013 Document Reviewed: 01/30/2013 [facility] Patient Information 2015 [place], [place]. This information is not intended to replace advice given to you by your health care provider. Make sure you discuss any questions you have with your health care provider.

## 2014-08-19 ENCOUNTER — Encounter: Payer: Self-pay | Admitting: Internal Medicine

## 2014-08-19 ENCOUNTER — Ambulatory Visit (INDEPENDENT_AMBULATORY_CARE_PROVIDER_SITE_OTHER): Payer: Medicare HMO | Admitting: Internal Medicine

## 2014-08-19 VITALS — BP 100/62 | HR 64 | Ht 65.0 in | Wt 268.6 lb

## 2014-08-19 DIAGNOSIS — I10 Essential (primary) hypertension: Secondary | ICD-10-CM

## 2014-08-19 DIAGNOSIS — E669 Obesity, unspecified: Secondary | ICD-10-CM

## 2014-08-19 DIAGNOSIS — I471 Supraventricular tachycardia: Secondary | ICD-10-CM

## 2014-08-19 MED ORDER — RIVAROXABAN 20 MG PO TABS: 20.0000 mg | ORAL_TABLET | Freq: Every day | ORAL | Status: DC

## 2014-08-19 MED ORDER — METOPROLOL TARTRATE 50 MG PO TABS: 25.0000 mg | ORAL_TABLET | Freq: Two times a day (BID) | ORAL | Status: DC

## 2014-08-19 NOTE — Assessment & Plan Note (Signed)
She appears to be maintaining sinus rhythm very nicely. She is asymptomatic. I've asked her to reduce her dose of metoprolol to 25 mg twice a day. She will continue flecainide. If her weight is down when I see her next, and she is maintaining sinus rhythm, I would anticipate reducing the dose of flecainide.

## 2014-08-19 NOTE — Assessment & Plan Note (Signed)
Her weight is going down. She will continue her current medical therapy.

## 2014-08-19 NOTE — Patient Instructions (Addendum)
Your physician wants you to follow-up in: 6 months with Dr Knox Saliva will receive a reminder letter in the mail two months in advance. If you don't receive a letter, please call our office to schedule the follow-up appointment.   Your physician has recommended you make the following change in your medication:  1) Decrease Metoprolol to 25mg  twice daily

## 2014-08-19 NOTE — Progress Notes (Signed)
HPI [name] returns today for followup. She is a very pleasant 59 year old woman with a history of recurrent SVT, hypertension, and COPD. She was in the hospital with SVT several weeks ago, which eventually settled down with medical therapy. She returns today for followup. She has lost over 40 lbs. She has not had more SVT. No chest pain.  Allergies  Allergen Reactions  . Caffeine Other (See Comments)    Migraine  . Ciprofloxacin Hives  . Lisinopril Cough  . Vicodin [Hydrocodone-Acetaminophen] Nausea And Vomiting and Other (See Comments)    Headache   . Erythromycin Hives  . Oxycodone Nausea And Vomiting    headache  . Sulfamethoxazole-Trimethoprim Rash     Current Outpatient Prescriptions  Medication Sig Dispense Refill  . albuterol (PROVENTIL HFA;VENTOLIN HFA) 108 (90 BASE) MCG/ACT inhaler Inhale 2 puffs into the lungs every 6 (six) hours as needed. For shortness of breath    . albuterol (PROVENTIL) (2.5 MG/3ML) 0.083% nebulizer solution Take 3 mLs (2.5 mg total) by nebulization every 6 (six) hours as needed. For shortness of breath 75 mL 12  . azelastine (OPTIVAR) 0.05 % ophthalmic solution Place 1 drop into both eyes 2 (two) times daily as needed. For dry/irritated eyes    . budesonide-formoterol (SYMBICORT) 160-4.5 MCG/ACT inhaler Inhale 2 puffs into the lungs 2 (two) times daily.      Marland Kitchen dexlansoprazole (DEXILANT) 60 MG capsule Take 60 mg by mouth every morning.     . diclofenac sodium (VOLTAREN) 1 % GEL Apply 2 g topically 2 (two) times daily as needed (For pain or inflammation on knees.).     Marland Kitchen flecainide (TAMBOCOR) 100 MG tablet Take 1 tablet (100 mg total) by mouth every 12 (twelve) hours. 180 tablet 3  . hydrochlorothiazide (HYDRODIURIL) 25 MG tablet Take 1 tablet (25 mg total) by mouth every morning. 90 tablet 3  . metoprolol (LOPRESSOR) 50 MG tablet Take 0.5 tablets (25 mg total) by mouth 2 (two) times daily. 180 tablet 3  . Olopatadine HCl 0.6 % SOLN Place 1  puff into the nose at bedtime as needed (For seasonal allergies.).     Marland Kitchen rivaroxaban (XARELTO) 20 MG TABS tablet Take 1 tablet (20 mg total) by mouth daily with supper. 30 tablet 11   Current Facility-Administered Medications  Medication Dose Route Frequency Provider Last Rate Last Dose  . diphenhydrAMINE (BENADRYL) capsule 50 mg  50 mg Oral Q6H PRN Minerva Ends, MD   50 mg at 01/06/12 1458     Past Medical History  Diagnosis Date  . Asthma   . Hypertension   . GERD (gastroesophageal reflux disease)   . Anxiety   . Depression   . Family history of anesthesia complication     "daughter; causes her to pass out afterwards"  . Pulmonary embolism 12/28/2013    "2 clots in each lung"  . Hyperthyroidism   . Migraine     "monthly" (12/28/2013)  . Osteoarthritis     "both knees; back of my neck; right pelvic bone" (12/28/2013)  . Chronic lower back pain     ROS:   All systems reviewed and negative except as noted in the HPI.   Past Surgical History  Procedure Laterality Date  . Abdominal hysterectomy    . Wisdom tooth extraction    . Thyroidectomy, partial Right 2005  . Nasal sinus surgery  2007  . Appendectomy    . Excisional hemorrhoidectomy    . Dilation and curettage  of uterus    . Tubal ligation    . Breast cyst excision Right      Family History  Problem Relation Age of Onset  . Osteoarthritis Mother   . Asthma Mother   . Heart failure Mother      History   Social History  . Marital Status: Single    Spouse Name: N/A  . Number of Children: N/A  . Years of Education: N/A   Occupational History  . Not on file.   Social History Main Topics  . Smoking status: Former Smoker -- 0.25 packs/day for 20 years    Types: Cigarettes    Quit date: 07/17/1999  . Smokeless tobacco: Never Used  . Alcohol Use: Yes     Comment: "drank some in my 30's"  . Drug Use: No  . Sexual Activity: No   Other Topics Concern  . Not on file   Social History Narrative      BP 100/62 mmHg  Pulse 64  Ht 5\' 5"  (1.651 m)  Wt 268 lb 9.6 oz (121.836 kg)  BMI 44.70 kg/m2  Physical Exam:  Well appearing 59 yo woman, NAD HEENT: Unremarkable Neck:  7 cm JVD, no thyromegally Back:  No CVA tenderness Lungs:  Clear with no wheezes HEART:  Regular rate rhythm, no murmurs, no rubs, no clicks Abd:  soft, positive bowel sounds, no organomegally, no rebound, no guarding Ext:  2 plus pulses, no edema, no cyanosis, no clubbing Skin:  No rashes no nodules Neuro:  CN II through XII intact, motor grossly intact   Assess/Plan:

## 2014-08-19 NOTE — Assessment & Plan Note (Signed)
Her blood pressure is well controlled. She is encouraged to reduce her salt intake and lose weight. She is currently doing both.

## 2014-09-26 ENCOUNTER — Ambulatory Visit: Payer: Self-pay | Admitting: Family Medicine

## 2014-09-27 ENCOUNTER — Inpatient Hospital Stay (HOSPITAL_COMMUNITY)
Admission: EM | Admit: 2014-09-27 | Discharge: 2014-10-01 | DRG: 202 | Disposition: A | Discharge status: Other Acute Inpatient Hospital | Payer: Medicare PPO | Attending: Family Medicine | Admitting: Family Medicine

## 2014-09-27 ENCOUNTER — Emergency Department (HOSPITAL_COMMUNITY): Payer: Medicare PPO

## 2014-09-27 ENCOUNTER — Encounter (HOSPITAL_COMMUNITY): Payer: Self-pay | Admitting: Emergency Medicine

## 2014-09-27 DIAGNOSIS — Z8249 Family history of ischemic heart disease and other diseases of the circulatory system: Secondary | ICD-10-CM

## 2014-09-27 DIAGNOSIS — E039 Hypothyroidism, unspecified: Secondary | ICD-10-CM | POA: Diagnosis present

## 2014-09-27 DIAGNOSIS — J45901 Unspecified asthma with (acute) exacerbation: Secondary | ICD-10-CM | POA: Insufficient documentation

## 2014-09-27 DIAGNOSIS — I10 Essential (primary) hypertension: Secondary | ICD-10-CM | POA: Diagnosis present

## 2014-09-27 DIAGNOSIS — Z7901 Long term (current) use of anticoagulants: Secondary | ICD-10-CM | POA: Diagnosis not present

## 2014-09-27 DIAGNOSIS — F332 Major depressive disorder, recurrent severe without psychotic features: Secondary | ICD-10-CM | POA: Diagnosis present

## 2014-09-27 DIAGNOSIS — Z86711 Personal history of pulmonary embolism: Secondary | ICD-10-CM | POA: Diagnosis not present

## 2014-09-27 DIAGNOSIS — E669 Obesity, unspecified: Secondary | ICD-10-CM | POA: Diagnosis present

## 2014-09-27 DIAGNOSIS — M170 Bilateral primary osteoarthritis of knee: Secondary | ICD-10-CM | POA: Diagnosis present

## 2014-09-27 DIAGNOSIS — Z6841 Body Mass Index (BMI) 40.0 and over, adult: Secondary | ICD-10-CM

## 2014-09-27 DIAGNOSIS — Z79899 Other long term (current) drug therapy: Secondary | ICD-10-CM

## 2014-09-27 DIAGNOSIS — R45851 Suicidal ideations: Secondary | ICD-10-CM | POA: Diagnosis not present

## 2014-09-27 DIAGNOSIS — Z825 Family history of asthma and other chronic lower respiratory diseases: Secondary | ICD-10-CM

## 2014-09-27 DIAGNOSIS — Z87891 Personal history of nicotine dependence: Secondary | ICD-10-CM | POA: Diagnosis not present

## 2014-09-27 DIAGNOSIS — K219 Gastro-esophageal reflux disease without esophagitis: Secondary | ICD-10-CM | POA: Diagnosis present

## 2014-09-27 DIAGNOSIS — Z9114 Patient's other noncompliance with medication regimen: Secondary | ICD-10-CM | POA: Diagnosis present

## 2014-09-27 DIAGNOSIS — G8929 Other chronic pain: Secondary | ICD-10-CM | POA: Diagnosis present

## 2014-09-27 DIAGNOSIS — E876 Hypokalemia: Secondary | ICD-10-CM | POA: Diagnosis present

## 2014-09-27 DIAGNOSIS — M545 Low back pain: Secondary | ICD-10-CM | POA: Diagnosis present

## 2014-09-27 DIAGNOSIS — J45902 Unspecified asthma with status asthmaticus: Principal | ICD-10-CM | POA: Diagnosis present

## 2014-09-27 DIAGNOSIS — I471 Supraventricular tachycardia: Secondary | ICD-10-CM | POA: Diagnosis present

## 2014-09-27 DIAGNOSIS — R0602 Shortness of breath: Secondary | ICD-10-CM | POA: Diagnosis present

## 2014-09-27 LAB — CBC WITH DIFFERENTIAL/PLATELET
Basophils Absolute: 0 10*3/uL (ref 0.0–0.1)
Basophils Relative: 0 % (ref 0–1)
Eosinophils Absolute: 0.2 10*3/uL (ref 0.0–0.7)
Eosinophils Relative: 1 % (ref 0–5)
HCT: 42 % (ref 36.0–46.0)
Hemoglobin: 13.6 g/dL (ref 12.0–15.0)
Lymphocytes Relative: 26 % (ref 12–46)
Lymphs Abs: 3.2 10*3/uL (ref 0.7–4.0)
MCH: 29.1 pg (ref 26.0–34.0)
MCHC: 32.4 g/dL (ref 30.0–36.0)
MCV: 89.7 fL (ref 78.0–100.0)
Monocytes Absolute: 0.3 10*3/uL (ref 0.1–1.0)
Monocytes Relative: 3 % (ref 3–12)
Neutro Abs: 8.9 10*3/uL — ABNORMAL HIGH (ref 1.7–7.7)
Neutrophils Relative %: 70 % (ref 43–77)
Platelets: 395 10*3/uL (ref 150–400)
RBC: 4.68 MIL/uL (ref 3.87–5.11)
RDW: 16.7 % — ABNORMAL HIGH (ref 11.5–15.5)
WBC: 12.6 10*3/uL — ABNORMAL HIGH (ref 4.0–10.5)

## 2014-09-27 LAB — BASIC METABOLIC PANEL WITH GFR
Anion gap: 14 (ref 5–15)
BUN: 12 mg/dL (ref 6–23)
CO2: 23 mmol/L (ref 19–32)
Calcium: 9.3 mg/dL (ref 8.4–10.5)
Chloride: 103 mmol/L (ref 96–112)
Creatinine, Ser: 1.02 mg/dL (ref 0.50–1.10)
GFR calc Af Amer: 68 mL/min — ABNORMAL LOW
GFR calc non Af Amer: 59 mL/min — ABNORMAL LOW
Glucose, Bld: 146 mg/dL — ABNORMAL HIGH (ref 70–99)
Potassium: 2.6 mmol/L — CL (ref 3.5–5.1)
Sodium: 140 mmol/L (ref 135–145)

## 2014-09-27 LAB — BRAIN NATRIURETIC PEPTIDE: B Natriuretic Peptide: 50.9 pg/mL (ref 0.0–100.0)

## 2014-09-27 LAB — MAGNESIUM: MAGNESIUM: 2.1 mg/dL (ref 1.5–2.5)

## 2014-09-27 MED ORDER — ALBUTEROL (5 MG/ML) CONTINUOUS INHALATION SOLN
10.0000 mg/h | INHALATION_SOLUTION | RESPIRATORY_TRACT | Status: DC
  Administered 2014-09-27: 10 mg/h via RESPIRATORY_TRACT
  Filled 2014-09-27: qty 20

## 2014-09-27 MED ORDER — IPRATROPIUM BROMIDE 0.02 % IN SOLN
0.5000 mg | Freq: Once | RESPIRATORY_TRACT | Status: AC
  Administered 2014-09-27: 0.5 mg via RESPIRATORY_TRACT
  Filled 2014-09-27: qty 2.5

## 2014-09-27 MED ORDER — POTASSIUM CHLORIDE CRYS ER 20 MEQ PO TBCR
40.0000 meq | EXTENDED_RELEASE_TABLET | Freq: Once | ORAL | Status: AC
  Administered 2014-09-27: 40 meq via ORAL
  Filled 2014-09-27: qty 2

## 2014-09-27 MED ORDER — ALBUTEROL SULFATE (2.5 MG/3ML) 0.083% IN NEBU
5.0000 mg | INHALATION_SOLUTION | Freq: Once | RESPIRATORY_TRACT | Status: AC
  Administered 2014-09-27: 5 mg via RESPIRATORY_TRACT
  Filled 2014-09-27: qty 6

## 2014-09-27 NOTE — H&P (Signed)
[facility] Admission History and Physical Service Pager: [phone]  Patient name: [name] White Medical record number: [id number] Date of birth: 25-Apr-1956 Age: 59 y.o. Gender: female  Primary Care Provider: Howard Pouch, DO Consultants: Psychiatry  Code Status: Full code  Chief Complaint: Asthma Exacerbation  Assessment and Plan: [name] White is a 59 y.o. female presenting with asthma exacerbation and panic attack. PMH is significant for persistent asthma vs COPD in former smoker, depression, history of PE, HTN, SVT, hypothyroidism, LE edema, obesity, bilateral knee OA, recent dx with multiple PEs 12/2013.   #Asthma Exacerbation: thought to be secondary to the distress caused by her SI. Doesn't appear to have any infection. CXR showing no acute findings. She sounded well on exam with good air movement.  - admit to telemetry, Dr. Andria Frames attending  - Duonebs q4/albuterol q2PRN - Prednisolone 50 mg - S/p mag x1 - Continue home symbicort - Supplemental O2 as needed - Will continue to monitor closely - consider adding singulair   #SI: history of Depression/Anxiety.  She had a similar episode in September 2015. Psych was consulted and she was transferred to [facility] for inpatient care.  As she has not taken anything for depression/anxiety since October, 2015. She was previously on Cymbalta which was stopped secondary to presumed interaction with her flecainide - Will consult with Psychiatry in the AM for med recs in setting of worsening depression/anxiety and difficulty adjusting to stressful life events - Will continue to monitor closely and have a low threshold for placing a sitter in her room if has worsening status - suicide prevention protocol  - Ativan Q4 PRN for anxiety   #Chest pain: atypical in nature. Reproducible upon exam. WIll rule out ACS  - trend troponins - repeat EKG in AM  #Hypokalemia: K 2.6, with no EKG changes c/w  hypokalemia - F/u mag - Will replete with oral K - Repeat BMP in AM  # History of PE: tachycardic on exam and shortness of breath but improvement with albuterol. Wells Criteria 3 for tachycardia and history of PE, of further concerning s/s oe PE will consider CT PE protocol  - Continue home Xarelto - need to confirm if patient has been compliant with xalerto  - consider CTA if worsens or fails to improve   # HTN: well controlled  - Continue home metoprolol, HCTZ  # H/o SVT: followed by Dr. Lovena Le.  - Flecainide, metoprolol  #Hypothyoidism: TSH 01/2014 wnl, not currently on meds  #FEN/GI:  -heart healthy diet/saline lock IV   Prophylaxis:  -Continue home Xarelto  Disposition: admitted to [place] for asthma exacerbation.   History of Present Illness: [name] White is a 59 y.o. female presenting with asthma exacerbation and panic attack.   She reports having a pain attack today. This panic attack made her asthma worse causing the exacerbation. Her breathing became worse at 6 pm. Shortness of breath was not responding to her home medications. She called EMS and was given Solu-medrol and magnesium. Currently her breathing is much better than compared to earlier.   Has a history of depression and anxiety.  She hasn't taken any medication since October 2015. She was previously seen at [facility].  Her daughter was recently diagnosed with breast cancer and told her on 2 days ago. Her roommate has found a new job in [place] and will be moving there. She will not be able to afford rent and will have to move by May.  Her car  is broken and has to take the bus everywhere. She feels that these stressors have been overwhelming especially the recent diagnosis of her daughter's breast cancer which precipitated the panic attack. She has not slept in 2 days. She reports having suicidal thoughts including ingestion and cutting for the last two weeks. Denies having any guns or weapons at home. Denies any HI  thoughts. She has been having these SI thoughts for about two weeks.   Review Of Systems: Per HPI with the following additions: See HPI  Otherwise 12 point review of systems was performed and was unremarkable.  Patient Active Problem List   Diagnosis Date Noted  . MDD (major depressive disorder), recurrent severe, without psychosis 03/05/2014  . Narrow complex tachycardia 01/31/2014  . Sustained SVT 01/31/2014  . Pulmonary embolism 12/27/2013  . Vocal cord dysfunction 11/12/2013  . Lower extremity edema 09/18/2013  . Rotator cuff impingement syndrome of right shoulder 09/18/2013  . Mixed headache 01/06/2012  . Hypothyroidism 11/01/2011  . OVERACTIVE BLADDER 04/18/2008  . OSTEOARTHRITIS, KNEES, BILATERAL 04/18/2008  . POLYARTHRITIS 09/14/2007  . Obesity 08/24/2006  . DEPRESSIVE DISORDER, NOS 08/24/2006  . RESTLESS LEGS SYNDROME 08/24/2006  . HYPERTENSION, BENIGN SYSTEMIC 08/24/2006  . RHINITIS, ALLERGIC 08/24/2006  . ASTHMA, PERSISTENT 08/24/2006  . GASTROESOPHAGEAL REFLUX, NO ESOPHAGITIS 08/24/2006   Past Medical History: Past Medical History  Diagnosis Date  . Asthma   . Hypertension   . GERD (gastroesophageal reflux disease)   . Anxiety   . Depression   . Family history of anesthesia complication     "daughter; causes her to pass out afterwards"  . Pulmonary embolism 12/28/2013    "2 clots in each lung"  . Hyperthyroidism   . Migraine     "monthly" (12/28/2013)  . Osteoarthritis     "both knees; back of my neck; right pelvic bone" (12/28/2013)  . Chronic lower back pain    Past Surgical History: Past Surgical History  Procedure Laterality Date  . Abdominal hysterectomy    . Wisdom tooth extraction    . Thyroidectomy, partial Right 2005  . Nasal sinus surgery  2007  . Appendectomy    . Excisional hemorrhoidectomy    . Dilation and curettage of uterus    . Tubal ligation    . Breast cyst excision Right    Social History: History  Substance Use Topics  . Smoking  status: Former Smoker -- 0.25 packs/day for 20 years    Types: Cigarettes    Quit date: 07/17/1999  . Smokeless tobacco: Never Used  . Alcohol Use: Yes     Comment: "drank some in my 30's"   Additional social history: no illicit drugs or alcohol, hx of tobacco use,   Please also refer to relevant sections of EMR.  Family History: Family History  Problem Relation Age of Onset  . Osteoarthritis Mother   . Asthma Mother   . Heart failure Mother    Allergies and Medications: Allergies  Allergen Reactions  . Caffeine Other (See Comments)    Migraine  . Vicodin [Hydrocodone-Acetaminophen] Nausea And Vomiting and Other (See Comments)    Headache   . Ciprofloxacin Hives  . Erythromycin Hives  . Lisinopril Cough  . Oxycodone Nausea And Vomiting    headache  . Sulfamethoxazole-Trimethoprim Rash   Current Facility-Administered Medications on File Prior to Encounter  Medication Dose Route Frequency Provider Last Rate Last Dose  . diphenhydrAMINE (BENADRYL) capsule 50 mg  50 mg Oral Q6H PRN Boykin Nearing, MD  50 mg at 01/06/12 1458   Current Outpatient Prescriptions on File Prior to Encounter  Medication Sig Dispense Refill  . albuterol (PROVENTIL HFA;VENTOLIN HFA) 108 (90 BASE) MCG/ACT inhaler Inhale 2 puffs into the lungs every 6 (six) hours as needed. For shortness of breath    . albuterol (PROVENTIL) (2.5 MG/3ML) 0.083% nebulizer solution Take 3 mLs (2.5 mg total) by nebulization every 6 (six) hours as needed. For shortness of breath 75 mL 12  . azelastine (OPTIVAR) 0.05 % ophthalmic solution Place 1 drop into both eyes 2 (two) times daily as needed. For dry/irritated eyes    . budesonide-formoterol (SYMBICORT) 160-4.5 MCG/ACT inhaler Inhale 2 puffs into the lungs 2 (two) times daily.      Marland Kitchen dexlansoprazole (DEXILANT) 60 MG capsule Take 60 mg by mouth every morning.     . diclofenac sodium (VOLTAREN) 1 % GEL Apply 2 g topically 2 (two) times daily as needed (For pain or  inflammation on knees.).     Marland Kitchen flecainide (TAMBOCOR) 100 MG tablet Take 1 tablet (100 mg total) by mouth every 12 (twelve) hours. 180 tablet 3  . hydrochlorothiazide (HYDRODIURIL) 25 MG tablet Take 1 tablet (25 mg total) by mouth every morning. 90 tablet 3  . metoprolol (LOPRESSOR) 50 MG tablet Take 0.5 tablets (25 mg total) by mouth 2 (two) times daily. 180 tablet 3  . Olopatadine HCl 0.6 % SOLN Place 1 puff into the nose at bedtime as needed (For seasonal allergies.).     Marland Kitchen rivaroxaban (XARELTO) 20 MG TABS tablet Take 1 tablet (20 mg total) by mouth daily with supper. 30 tablet 11    Objective: BP 98/64 mmHg  Pulse 122  Temp(Src) 99.5 F (37.5 C) (Rectal)  Resp 10  SpO2 97% Exam: General: Elderly African American female,  Cardiac: Tachycardic, 2/6 systolic murmur Respiratory: CTAB, normal effort, no wheezes Abdomen: soft, nontender, nondistended, no hepatic or splenomegaly. Bowel sounds present Extremities: no edema or cyanosis. Mild tenderness to palpation bilaterally, no edema. 2+ pedal pulses bilaterally Skin: warm and dry, no rashes noted Neuro: alert and oriented, no focal deficits MSK: Mild tenderness to palpation over right and central chest Psych: distressed and tearful through exam.   Labs and Imaging: CBC BMET   Recent Labs Lab 09/27/14 2010  WBC 12.6*  HGB 13.6  HCT 42.0  PLT 395    Recent Labs Lab 09/27/14 2010  NA 140  K 2.6*  CL 103  CO2 23  BUN 12  CREATININE 1.02  GLUCOSE 146*  CALCIUM 9.3     CXR 4/2 IMPRESSION: 1. No acute findings. 2. Atherosclerotic aortic arch.   Alyssa A. Lincoln Brigham MD, [place] [facility] Resident PGY-1 [place] Intern pager: [phone], text pages welcome  Upper Level Addendum:  I have seen and evaluated this patient along with Dr. Lincoln Brigham and reviewed the above note, making necessary revisions in [facility].   Clearance Coots, MD Family Medicine PGY-2

## 2014-09-27 NOTE — ED Notes (Signed)
Dr. Winfred Leeds at bedside. No new orders.

## 2014-09-27 NOTE — ED Notes (Signed)
Per [facility], pt started having asthma attack, called out for SOB, audible wheezing, pt used rescue inhaler with no relief. EMS gave 125 solumedol , 2 of mag, 10 albuterol, .5 atrovent. Pt states her breathing is a little better. 12 lead shows A flutter, with history of same. Pt is AAOX4. On NRB upon arrival to ED. VSS

## 2014-09-27 NOTE — ED Notes (Signed)
Potassium 2.6 critical value from lab, Dr. Winfred Leeds notified.

## 2014-09-27 NOTE — ED Notes (Signed)
Pt requesting psychiatric evaluation at this time. Denies thoughts of hurting self or others. States shes been depressed recently and had a panic attack which caused her asthma attack.

## 2014-09-27 NOTE — ED Provider Notes (Addendum)
CSN: [id number]     Arrival date & time 09/27/14  1856 History   First MD Initiated Contact with Patient 09/27/14 1915     Chief Complaint  Patient presents with  . Shortness of Breath   Level V caveat patient extremely dyspneic.  (Consider location/radiation/quality/duration/timing/severity/associated sxs/prior Treatment) HPI [place] of wheezing typical of asthma onset 2 hours ago. Symptoms improving with treatment rendered in the field which included Solu-Medrol 125 and magnesium 2 g intravenously.. Admits to cough, nonproductive for the past 2 days. No other associated symptoms Past Medical History  Diagnosis Date  . Asthma   . Hypertension   . GERD (gastroesophageal reflux disease)   . Anxiety   . Depression   . Family history of anesthesia complication     "daughter; causes her to pass out afterwards"  . Pulmonary embolism 12/28/2013    "2 clots in each lung"  . Hyperthyroidism   . Migraine     "monthly" (12/28/2013)  . Osteoarthritis     "both knees; back of my neck; right pelvic bone" (12/28/2013)  . Chronic lower back pain    Past Surgical History  Procedure Laterality Date  . Abdominal hysterectomy    . Wisdom tooth extraction    . Thyroidectomy, partial Right 2005  . Nasal sinus surgery  2007  . Appendectomy    . Excisional hemorrhoidectomy    . Dilation and curettage of uterus    . Tubal ligation    . Breast cyst excision Right    Family History  Problem Relation Age of Onset  . Osteoarthritis Mother   . Asthma Mother   . Heart failure Mother    History  Substance Use Topics  . Smoking status: Former Smoker -- 0.25 packs/day for 20 years    Types: Cigarettes    Quit date: 07/17/1999  . Smokeless tobacco: Never Used  . Alcohol Use: Yes     Comment: "drank some in my 30's"   OB History    No data available     Review of Systems  Unable to perform ROS Respiratory: Positive for cough and shortness of breath.       Allergies  Caffeine;  Ciprofloxacin; Lisinopril; Vicodin; Erythromycin; Oxycodone; and Sulfamethoxazole-trimethoprim  Home Medications   Prior to Admission medications   Medication Sig Start Date End Date Taking? Authorizing Provider  albuterol (PROVENTIL HFA;VENTOLIN HFA) 108 (90 BASE) MCG/ACT inhaler Inhale 2 puffs into the lungs every 6 (six) hours as needed. For shortness of breath    Historical Provider, MD  albuterol (PROVENTIL) (2.5 MG/3ML) 0.083% nebulizer solution Take 3 mLs (2.5 mg total) by nebulization every 6 (six) hours as needed. For shortness of breath 04/07/13   Hilton Sinclair, MD  azelastine (OPTIVAR) 0.05 % ophthalmic solution Place 1 drop into both eyes 2 (two) times daily as needed. For dry/irritated eyes    Historical Provider, MD  budesonide-formoterol (SYMBICORT) 160-4.5 MCG/ACT inhaler Inhale 2 puffs into the lungs 2 (two) times daily.      Historical Provider, MD  dexlansoprazole (DEXILANT) 60 MG capsule Take 60 mg by mouth every morning.     Historical Provider, MD  diclofenac sodium (VOLTAREN) 1 % GEL Apply 2 g topically 2 (two) times daily as needed (For pain or inflammation on knees.).     Historical Provider, MD  flecainide (TAMBOCOR) 100 MG tablet Take 1 tablet (100 mg total) by mouth every 12 (twelve) hours. 04/24/14   Evans Lance, MD  hydrochlorothiazide (HYDRODIURIL) 25 MG tablet  Take 1 tablet (25 mg total) by mouth every morning. 04/24/14   Evans Lance, MD  metoprolol (LOPRESSOR) 50 MG tablet Take 0.5 tablets (25 mg total) by mouth 2 (two) times daily. 08/19/14   Evans Lance, MD  Olopatadine HCl 0.6 % SOLN Place 1 puff into the nose at bedtime as needed (For seasonal allergies.).     Historical Provider, MD  rivaroxaban (XARELTO) 20 MG TABS tablet Take 1 tablet (20 mg total) by mouth daily with supper. 08/19/14 10/28/14  Evans Lance, MD   BP 125/96 mmHg  Pulse 142  Temp(Src) 97.9 F (36.6 C) (Oral)  Resp 25  SpO2 100% Physical Exam  Constitutional: She appears  well-developed and well-nourished. She appears distressed.  Respiratory distress, speaks in sentences  HENT:  Head: Normocephalic and atraumatic.  Eyes: Conjunctivae are normal. Pupils are equal, round, and reactive to light.  Neck: Neck supple. No tracheal deviation present. No thyromegaly present.  Cardiovascular: Regular rhythm.   No murmur heard. Tachycardic  Pulmonary/Chest: She is in respiratory distress. She has wheezes.  diFuse rhonchi and wheezes on my use of accessory muscles  Abdominal: Soft. Bowel sounds are normal. She exhibits no distension. There is no tenderness.  Obese  Musculoskeletal: Normal range of motion. She exhibits no edema or tenderness.  Neurological: She is alert. Coordination normal.  Skin: Skin is warm and dry. No rash noted.  Psychiatric: She has a normal mood and affect.  Nursing note and vitals reviewed.   ED Course  Procedures (including critical care time) Labs Review Labs Reviewed - No data to display  Imaging Review No results found.   EKG Interpretation   Date/Time:  Saturday September 27 2014 19:10:36 EDT Ventricular Rate:  140 PR Interval:  179 QRS Duration: 90 QT Interval:  278 QTC Calculation: 424 R Axis:   -158 Text Interpretation:  Sinus tachycardia Right axis deviation Abnormal  R-wave progression, late transition Repol abnrm suggests ischemia, diffuse  leads Baseline wander in lead(s) I aVR aVL SINCE LAST TRACING HEART RATE  HAS INCREASED Confirmed by Winfred Leeds  MD, Lakeyia Surber [phone]) on 09/27/2014 7:58:29  PM     9: 40 p.m. breathing much improved after treatment with continuous nebulization. She now speaks in paragraphs however breathing is not at baseline. Chest x-ray viewed by me Results for orders placed or performed during the hospital encounter of 09/27/14  CBC with Differential/Platelet  Result Value Ref Range   WBC 12.6 (H) 4.0 - 10.5 K/uL   RBC 4.68 3.87 - 5.11 MIL/uL   Hemoglobin 13.6 12.0 - 15.0 g/dL   HCT 42.0 36.0 -  46.0 %   MCV 89.7 78.0 - 100.0 fL   MCH 29.1 26.0 - 34.0 pg   MCHC 32.4 30.0 - 36.0 g/dL   RDW 16.7 (H) 11.5 - 15.5 %   Platelets 395 150 - 400 K/uL   Neutrophils Relative % 70 43 - 77 %   Neutro Abs 8.9 (H) 1.7 - 7.7 K/uL   Lymphocytes Relative 26 12 - 46 %   Lymphs Abs 3.2 0.7 - 4.0 K/uL   Monocytes Relative 3 3 - 12 %   Monocytes Absolute 0.3 0.1 - 1.0 K/uL   Eosinophils Relative 1 0 - 5 %   Eosinophils Absolute 0.2 0.0 - 0.7 K/uL   Basophils Relative 0 0 - 1 %   Basophils Absolute 0.0 0.0 - 0.1 K/uL  Brain natriuretic peptide  Result Value Ref Range   B Natriuretic Peptide 50.9  0.0 - 100.0 pg/mL  Basic metabolic panel  Result Value Ref Range   Sodium 140 135 - 145 mmol/L   Potassium 2.6 (LL) 3.5 - 5.1 mmol/L   Chloride 103 96 - 112 mmol/L   CO2 23 19 - 32 mmol/L   Glucose, Bld 146 (H) 70 - 99 mg/dL   BUN 12 6 - 23 mg/dL   Creatinine, Ser 1.02 0.50 - 1.10 mg/dL   Calcium 9.3 8.4 - 10.5 mg/dL   GFR calc non Af Amer 59 (L) >90 mL/min   GFR calc Af Amer 68 (L) >90 mL/min   Anion gap 14 5 - 15   Dg Chest Port 1 View  09/27/2014   CLINICAL DATA:  Shortness of breath.  Asthma.  EXAM: PORTABLE CHEST - 1 VIEW  COMPARISON:  01/31/2014  FINDINGS: Mild atherosclerotic calcification of the aortic arch. Currently I do not discern significant airway thickening. Heart size within normal limits. The lungs appear clear although the left costophrenic angle was omitted.  IMPRESSION: 1. No acute findings. 2. Atherosclerotic aortic arch.   Electronically Signed   By: Van Clines M.D.   On: 09/27/2014 21:13   Potassium supplemented in the ED MDM  Spoke with family medicine resident who will arrange for inpt stay Dx#1 status asthmaticus  #2 hypokalemia Final diagnoses:  None   CRITICAL CARE Performed by: Orlie Dakin Total critical care time: 30 minute Critical care time was exclusive of separately billable procedures and treating other patients. Critical care was necessary to  treat or prevent imminent or life-threatening deterioration. Critical care was time spent personally by me on the following activities: development of treatment plan with patient and/or surrogate as well as nursing, discussions with consultants, evaluation of patient's response to treatment, examination of patient, obtaining history from patient or surrogate, ordering and performing treatments and interventions, ordering and review of laboratory studies, ordering and review of radiographic studies, pulse oximetry and re-evaluation of patient's condition.     Orlie Dakin, MD 09/27/14 2209  Orlie Dakin, MD 09/27/14 [phone]

## 2014-09-28 DIAGNOSIS — F332 Major depressive disorder, recurrent severe without psychotic features: Secondary | ICD-10-CM

## 2014-09-28 DIAGNOSIS — R45851 Suicidal ideations: Secondary | ICD-10-CM

## 2014-09-28 DIAGNOSIS — J45902 Unspecified asthma with status asthmaticus: Secondary | ICD-10-CM | POA: Insufficient documentation

## 2014-09-28 DIAGNOSIS — R0602 Shortness of breath: Secondary | ICD-10-CM

## 2014-09-28 LAB — CBC
HCT: 37.6 % (ref 36.0–46.0)
Hemoglobin: 12.1 g/dL (ref 12.0–15.0)
MCH: 28.9 pg (ref 26.0–34.0)
MCHC: 32.2 g/dL (ref 30.0–36.0)
MCV: 90 fL (ref 78.0–100.0)
Platelets: 400 10*3/uL (ref 150–400)
RBC: 4.18 MIL/uL (ref 3.87–5.11)
RDW: 17 % — ABNORMAL HIGH (ref 11.5–15.5)
WBC: 10.5 10*3/uL (ref 4.0–10.5)

## 2014-09-28 LAB — BASIC METABOLIC PANEL
Anion gap: 9 (ref 5–15)
BUN: 12 mg/dL (ref 6–23)
CHLORIDE: 104 mmol/L (ref 96–112)
CO2: 22 mmol/L (ref 19–32)
Calcium: 8.6 mg/dL (ref 8.4–10.5)
Creatinine, Ser: 0.82 mg/dL (ref 0.50–1.10)
GFR calc non Af Amer: 77 mL/min — ABNORMAL LOW (ref >90)
GFR, EST AFRICAN AMERICAN: 89 mL/min — AB (ref >90)
Glucose, Bld: 159 mg/dL — ABNORMAL HIGH (ref 70–99)
POTASSIUM: 4.4 mmol/L (ref 3.5–5.1)
Sodium: 135 mmol/L (ref 135–145)

## 2014-09-28 LAB — TROPONIN I
Troponin I: <0.03 ng/mL (ref <0.031)
Troponin I: <0.03 ng/mL (ref <0.031)

## 2014-09-28 LAB — MRSA PCR SCREENING: MRSA by PCR: NEGATIVE

## 2014-09-28 MED ORDER — ARIPIPRAZOLE 5 MG PO TABS
5.0000 mg | ORAL_TABLET | Freq: Every day | ORAL | Status: DC
  Administered 2014-09-28 – 2014-09-30 (×4): 5 mg via ORAL
  Filled 2014-09-28 (×4): qty 1

## 2014-09-28 MED ORDER — PREDNISONE 50 MG PO TABS
50.0000 mg | ORAL_TABLET | Freq: Every day | ORAL | Status: DC
  Administered 2014-09-28 – 2014-09-30 (×3): 50 mg via ORAL
  Filled 2014-09-28 (×5): qty 1

## 2014-09-28 MED ORDER — LORAZEPAM 2 MG/ML IJ SOLN
1.0000 mg | INTRAMUSCULAR | Status: DC | PRN
  Administered 2014-09-28: 1 mg via INTRAMUSCULAR
  Filled 2014-09-28: qty 1

## 2014-09-28 MED ORDER — SODIUM CHLORIDE 0.9 % IJ SOLN
3.0000 mL | Freq: Two times a day (BID) | INTRAMUSCULAR | Status: DC
  Administered 2014-09-28 – 2014-09-30 (×5): 3 mL via INTRAVENOUS

## 2014-09-28 MED ORDER — KETOTIFEN FUMARATE 0.025 % OP SOLN
1.0000 [drp] | Freq: Two times a day (BID) | OPHTHALMIC | Status: DC | PRN
  Filled 2014-09-28: qty 5

## 2014-09-28 MED ORDER — FLECAINIDE ACETATE 100 MG PO TABS
100.0000 mg | ORAL_TABLET | Freq: Two times a day (BID) | ORAL | Status: DC
  Administered 2014-09-28 – 2014-09-30 (×7): 100 mg via ORAL
  Filled 2014-09-28 (×9): qty 1

## 2014-09-28 MED ORDER — METOPROLOL TARTRATE 25 MG PO TABS
25.0000 mg | ORAL_TABLET | Freq: Two times a day (BID) | ORAL | Status: DC
  Administered 2014-09-28 – 2014-09-30 (×6): 25 mg via ORAL
  Filled 2014-09-28 (×9): qty 1

## 2014-09-28 MED ORDER — PNEUMOCOCCAL VAC POLYVALENT 25 MCG/0.5ML IJ INJ
0.5000 mL | INJECTION | INTRAMUSCULAR | Status: AC
  Administered 2014-09-29: 0.5 mL via INTRAMUSCULAR
  Filled 2014-09-28: qty 0.5

## 2014-09-28 MED ORDER — ALBUTEROL SULFATE (2.5 MG/3ML) 0.083% IN NEBU: 2.5000 mg | INHALATION_SOLUTION | RESPIRATORY_TRACT | Status: DC | PRN

## 2014-09-28 MED ORDER — SENNOSIDES-DOCUSATE SODIUM 8.6-50 MG PO TABS: 1.0000 | ORAL_TABLET | Freq: Every evening | ORAL | Status: DC | PRN

## 2014-09-28 MED ORDER — PANTOPRAZOLE SODIUM 40 MG PO TBEC
40.0000 mg | DELAYED_RELEASE_TABLET | Freq: Every day | ORAL | Status: DC
  Administered 2014-09-28 – 2014-09-30 (×3): 40 mg via ORAL
  Filled 2014-09-28 (×4): qty 1

## 2014-09-28 MED ORDER — FLECAINIDE ACETATE 100 MG PO TABS
100.0000 mg | ORAL_TABLET | Freq: Two times a day (BID) | ORAL | Status: DC
  Filled 2014-09-28 (×2): qty 1

## 2014-09-28 MED ORDER — LORAZEPAM 1 MG PO TABS
1.0000 mg | ORAL_TABLET | ORAL | Status: DC | PRN
  Administered 2014-09-28 – 2014-09-30 (×5): 1 mg via ORAL
  Filled 2014-09-28 (×5): qty 1

## 2014-09-28 MED ORDER — ALBUTEROL SULFATE (2.5 MG/3ML) 0.083% IN NEBU
3.0000 mL | INHALATION_SOLUTION | RESPIRATORY_TRACT | Status: DC | PRN
  Administered 2014-09-30: 3 mL via RESPIRATORY_TRACT
  Filled 2014-09-28: qty 3

## 2014-09-28 MED ORDER — SODIUM CHLORIDE 0.9 % IJ SOLN
3.0000 mL | Freq: Two times a day (BID) | INTRAMUSCULAR | Status: DC
  Administered 2014-09-28 – 2014-09-29 (×3): 3 mL via INTRAVENOUS

## 2014-09-28 MED ORDER — BUDESONIDE-FORMOTEROL FUMARATE 160-4.5 MCG/ACT IN AERO
2.0000 | INHALATION_SPRAY | Freq: Two times a day (BID) | RESPIRATORY_TRACT | Status: DC
  Administered 2014-09-28 – 2014-09-29 (×3): 2 via RESPIRATORY_TRACT
  Filled 2014-09-28: qty 6

## 2014-09-28 MED ORDER — ZOLPIDEM TARTRATE 5 MG PO TABS
5.0000 mg | ORAL_TABLET | Freq: Every evening | ORAL | Status: DC | PRN
  Administered 2014-09-28 – 2014-09-30 (×3): 5 mg via ORAL
  Filled 2014-09-28 (×3): qty 1

## 2014-09-28 MED ORDER — IPRATROPIUM-ALBUTEROL 0.5-2.5 (3) MG/3ML IN SOLN
3.0000 mL | RESPIRATORY_TRACT | Status: DC
  Administered 2014-09-28 (×2): 3 mL via RESPIRATORY_TRACT
  Filled 2014-09-28 (×2): qty 3

## 2014-09-28 MED ORDER — IPRATROPIUM-ALBUTEROL 0.5-2.5 (3) MG/3ML IN SOLN
3.0000 mL | RESPIRATORY_TRACT | Status: DC | PRN
  Filled 2014-09-28: qty 3

## 2014-09-28 MED ORDER — SODIUM CHLORIDE 0.9 % IV SOLN: 250.0000 mL | INTRAVENOUS | Status: DC | PRN

## 2014-09-28 MED ORDER — HYDROCHLOROTHIAZIDE 25 MG PO TABS
25.0000 mg | ORAL_TABLET | Freq: Every morning | ORAL | Status: DC
  Administered 2014-09-28 – 2014-09-30 (×3): 25 mg via ORAL
  Filled 2014-09-28 (×5): qty 1

## 2014-09-28 MED ORDER — POTASSIUM CHLORIDE CRYS ER 20 MEQ PO TBCR
40.0000 meq | EXTENDED_RELEASE_TABLET | Freq: Two times a day (BID) | ORAL | Status: DC
  Administered 2014-09-28 (×2): 40 meq via ORAL
  Filled 2014-09-28 (×4): qty 2

## 2014-09-28 MED ORDER — SODIUM CHLORIDE 0.9 % IJ SOLN: 3.0000 mL | INTRAMUSCULAR | Status: DC | PRN

## 2014-09-28 MED ORDER — DULOXETINE HCL 60 MG PO CPEP: 60.0000 mg | ORAL_CAPSULE | Freq: Every day | ORAL | Status: DC

## 2014-09-28 MED ORDER — RIVAROXABAN 20 MG PO TABS
20.0000 mg | ORAL_TABLET | Freq: Every day | ORAL | Status: DC
  Administered 2014-09-28 – 2014-09-30 (×3): 20 mg via ORAL
  Filled 2014-09-28 (×4): qty 1

## 2014-09-28 NOTE — Consult Note (Signed)
[facility] Face-to-Face Psychiatry Consult   Reason for Consult:  Suicidal thoughts Referring Physician: Dr. Jerline Pain Patient Identification: [name] White MRN:  [id number] Principal Diagnosis: MDD (major depressive disorder), recurrent severe, without psychosis Diagnosis:   Patient Active Problem List   Diagnosis Date Noted  . Suicidal ideation [R45.851] 09/27/2014    Priority: High  . MDD (major depressive disorder), recurrent severe, without psychosis [F33.2] 03/05/2014    Priority: High  . Narrow complex tachycardia [I47.1] 01/31/2014  . Sustained SVT [I47.1] 01/31/2014  . Pulmonary embolism [I26.99] 12/27/2013  . Vocal cord dysfunction [J38.3] 11/12/2013  . Lower extremity edema [R60.0] 09/18/2013  . Rotator cuff impingement syndrome of right shoulder [M75.101] 09/18/2013  . Mixed headache [R51] 01/06/2012  . Hypothyroidism [E03.9] 11/01/2011  . OVERACTIVE BLADDER [N31.8] 04/18/2008  . OSTEOARTHRITIS, KNEES, BILATERAL [M17.9] 04/18/2008  . POLYARTHRITIS [M13.0] 09/14/2007  . Obesity [E66.9] 08/24/2006  . DEPRESSIVE DISORDER, NOS [F32.9] 08/24/2006  . RESTLESS LEGS SYNDROME [G25.89] 08/24/2006  . HYPERTENSION, BENIGN SYSTEMIC [I10] 08/24/2006  . RHINITIS, ALLERGIC [J30.9] 08/24/2006  . ASTHMA, PERSISTENT [J45.909] 08/24/2006  . GASTROESOPHAGEAL REFLUX, NO ESOPHAGITIS [K21.9] 08/24/2006    Total Time spent with patient: 1 hour  Subjective:   [name] White is a 59 y.o. female patient admitted with shortness of breath and Asthma exercebation.  HPI: Thanks for asking me to see this patient for psychiatric consult. Patient is a 59 year old woman with multiple medical problem and past history of Major depressive disorder. Patient reports that she has been getting increasingly more depressed since her 36 year old daughter was diagnosed with stage 2 breast cancer few weeks ago. She also reports increased anxiety, worries, apprehensions, low energy level, anhedonia and suicidal  thoughts . Patient reports prior history of suicidal thoughts in 08/04/2009 after her son died, she was admitted to [facility] for stabilization. Patient also reports recurrent panic attacks for that past few days, she endorses at least 2 episodes in the last few days characterized by shortness of breath, chest tightness, cold sweating and diaphoresis. Patient is employed, lives with a room mate who is relocating to the [place] area soon. As a result, she has become overwhelmed thinking about being lonely and not being to pay her bills. Patient denies drugs/alcohol abuse, delusional thinking or psychosis. She will benefit from psychiatric inpatient admission as she is unable to contract for safety. Patient normally gets her outpatient care at [facility] where she is placed on Cymbalta 29m daily  and Abilify 251mqhs.  HPI Elements:   Location:  suicidal, depressed. Quality:  severe. Duration:  few weeks. Context:  family issues.  Past Medical History:  Past Medical History  Diagnosis Date  . Asthma   . Hypertension   . GERD (gastroesophageal reflux disease)   . Anxiety   . Depression   . Family history of anesthesia complication     "daughter; causes her to pass out afterwards"  . Pulmonary embolism 12/28/2013    "2 clots in each lung"  . Hyperthyroidism   . Migraine     "monthly" (12/28/2013)  . Osteoarthritis     "both knees; back of my neck; right pelvic bone" (12/28/2013)  . Chronic lower back pain     Past Surgical History  Procedure Laterality Date  . Abdominal hysterectomy    . Wisdom tooth extraction    . Thyroidectomy, partial Right 2008-Feb-2005. Nasal sinus surgery  202007-02-08. Appendectomy    . Excisional hemorrhoidectomy    . Dilation  and curettage of uterus    . Tubal ligation    . Breast cyst excision Right    Family History:  Family History  Problem Relation Age of Onset  . Osteoarthritis Mother   . Asthma Mother   . Heart failure Mother    Social History:  History  Alcohol Use  . Yes     Comment: "drank some in my 30's"     History  Drug Use No    History   Social History  . Marital Status: Single    Spouse Name: N/A  . Number of Children: N/A  . Years of Education: N/A   Social History Main Topics  . Smoking status: Former Smoker -- 0.25 packs/day for 20 years    Types: Cigarettes    Quit date: 07/17/1999  . Smokeless tobacco: Never Used  . Alcohol Use: Yes     Comment: "drank some in my 30's"  . Drug Use: No  . Sexual Activity: No   Other Topics Concern  . None   Social History Narrative   Additional Social History:                          Allergies:   Allergies  Allergen Reactions  . Caffeine Other (See Comments)    Migraine  . Vicodin [Hydrocodone-Acetaminophen] Nausea And Vomiting and Other (See Comments)    Headache   . Ciprofloxacin Hives  . Erythromycin Hives  . Lisinopril Cough  . Oxycodone Nausea And Vomiting    headache  . Sulfamethoxazole-Trimethoprim Rash    Labs:  Results for orders placed or performed during the hospital encounter of 09/27/14 (from the past 48 hour(s))  CBC with Differential/Platelet     Status: Abnormal   Collection Time: 09/27/14  8:10 PM  Result Value Ref Range   WBC 12.6 (H) 4.0 - 10.5 K/uL   RBC 4.68 3.87 - 5.11 MIL/uL   Hemoglobin 13.6 12.0 - 15.0 g/dL   HCT 42.0 36.0 - 46.0 %   MCV 89.7 78.0 - 100.0 fL   MCH 29.1 26.0 - 34.0 pg   MCHC 32.4 30.0 - 36.0 g/dL   RDW 16.7 (H) 11.5 - 15.5 %   Platelets 395 150 - 400 K/uL   Neutrophils Relative % 70 43 - 77 %   Neutro Abs 8.9 (H) 1.7 - 7.7 K/uL   Lymphocytes Relative 26 12 - 46 %   Lymphs Abs 3.2 0.7 - 4.0 K/uL   Monocytes Relative 3 3 - 12 %   Monocytes Absolute 0.3 0.1 - 1.0 K/uL   Eosinophils Relative 1 0 - 5 %   Eosinophils Absolute 0.2 0.0 - 0.7 K/uL   Basophils Relative 0 0 - 1 %   Basophils Absolute 0.0 0.0 - 0.1 K/uL  Brain natriuretic peptide     Status: None   Collection Time: 09/27/14  8:10 PM  Result Value Ref Range   B  Natriuretic Peptide 50.9 0.0 - 100.0 pg/mL  Basic metabolic panel     Status: Abnormal   Collection Time: 09/27/14  8:10 PM  Result Value Ref Range   Sodium 140 135 - 145 mmol/L   Potassium 2.6 (LL) 3.5 - 5.1 mmol/L    Comment: REPEATED TO VERIFY CRITICAL RESULT CALLED TO, READ BACK BY AND VERIFIED WITH: DOSS C,RN 09/27/14 2103 WAYK    Chloride 103 96 - 112 mmol/L   CO2 23 19 - 32 mmol/L   Glucose, Bld  146 (H) 70 - 99 mg/dL   BUN 12 6 - 23 mg/dL   Creatinine, Ser 1.02 0.50 - 1.10 mg/dL   Calcium 9.3 8.4 - 10.5 mg/dL   GFR calc non Af Amer 59 (L) >90 mL/min   GFR calc Af Amer 68 (L) >90 mL/min    Comment: (NOTE) The eGFR has been calculated using the CKD EPI equation. This calculation has not been validated in all clinical situations. eGFR's persistently <90 mL/min signify possible Chronic Kidney Disease.    Anion gap 14 5 - 15  Magnesium     Status: None   Collection Time: 09/27/14  8:10 PM  Result Value Ref Range   Magnesium 2.1 1.5 - 2.5 mg/dL  MRSA PCR Screening     Status: None   Collection Time: 09/28/14  3:04 AM  Result Value Ref Range   MRSA by PCR NEGATIVE NEGATIVE    Comment:        The GeneXpert MRSA Assay (FDA approved for NASAL specimens only), is one component of a comprehensive MRSA colonization surveillance program. It is not intended to diagnose MRSA infection nor to guide or monitor treatment for MRSA infections.   CBC     Status: Abnormal   Collection Time: 09/28/14  8:25 AM  Result Value Ref Range   WBC 10.5 4.0 - 10.5 K/uL   RBC 4.18 3.87 - 5.11 MIL/uL   Hemoglobin 12.1 12.0 - 15.0 g/dL   HCT 37.6 36.0 - 46.0 %   MCV 90.0 78.0 - 100.0 fL   MCH 28.9 26.0 - 34.0 pg   MCHC 32.2 30.0 - 36.0 g/dL   RDW 17.0 (H) 11.5 - 15.5 %   Platelets 400 150 - 400 K/uL  Basic metabolic panel     Status: Abnormal   Collection Time: 09/28/14  8:25 AM  Result Value Ref Range   Sodium 135 135 - 145 mmol/L   Potassium 4.4 3.5 - 5.1 mmol/L   Chloride 104 96 -  112 mmol/L   CO2 22 19 - 32 mmol/L   Glucose, Bld 159 (H) 70 - 99 mg/dL   BUN 12 6 - 23 mg/dL   Creatinine, Ser 0.82 0.50 - 1.10 mg/dL   Calcium 8.6 8.4 - 10.5 mg/dL   GFR calc non Af Amer 77 (L) >90 mL/min   GFR calc Af Amer 89 (L) >90 mL/min    Comment: (NOTE) The eGFR has been calculated using the CKD EPI equation. This calculation has not been validated in all clinical situations. eGFR's persistently <90 mL/min signify possible Chronic Kidney Disease.    Anion gap 9 5 - 15  Troponin I     Status: None   Collection Time: 09/28/14  8:25 AM  Result Value Ref Range   Troponin I <0.03 <0.031 ng/mL    Comment:        NO INDICATION OF MYOCARDIAL INJURY.     Vitals: Blood pressure 94/57, pulse 127, temperature 98.5 F (36.9 C), temperature source Oral, resp. rate 18, weight 119.2 kg (262 lb 12.6 oz), SpO2 99 %.  Risk to Self: Is patient at risk for suicide?: No Risk to Others:   Prior Inpatient Therapy:   Prior Outpatient Therapy:    Current Facility-Administered Medications  Medication Dose Route Frequency Provider Last Rate Last Dose  . 0.9 %  sodium chloride infusion  250 mL Intravenous PRN Rosemarie Ax, MD      . albuterol (PROVENTIL) (2.5 MG/3ML) 0.083% nebulizer solution 3 mL  3 mL Inhalation Q4H PRN Veatrice Bourbon, MD      . albuterol (PROVENTIL,VENTOLIN) solution continuous neb  10 mg/hr Nebulization Continuous Orlie Dakin, MD   Stopped at 09/27/14 2200  . ARIPiprazole (ABILIFY) tablet 5 mg  5 mg Oral QHS Sylina Henion      . budesonide-formoterol (SYMBICORT) 160-4.5 MCG/ACT inhaler 2 puff  2 puff Inhalation BID Rosemarie Ax, MD   2 puff at 09/28/14 1000  . [START ON 09/29/2014] DULoxetine (CYMBALTA) DR capsule 60 mg  60 mg Oral Daily Lynnix Schoneman      . flecainide (TAMBOCOR) tablet 100 mg  100 mg Oral Q12H Zenia Resides, MD   100 mg at 09/28/14 1059  . hydrochlorothiazide (HYDRODIURIL) tablet 25 mg  25 mg Oral q morning - 10a Rosemarie Ax, MD   25  mg at 09/28/14 1100  . ipratropium-albuterol (DUONEB) 0.5-2.5 (3) MG/3ML nebulizer solution 3 mL  3 mL Nebulization Q4H PRN Zenia Resides, MD      . ketotifen (ZADITOR) 0.025 % ophthalmic solution 1 drop  1 drop Both Eyes BID PRN Rosemarie Ax, MD      . LORazepam (ATIVAN) tablet 1 mg  1 mg Oral Q4H PRN Rosemarie Ax, MD   1 mg at 09/28/14 0420   Or  . LORazepam (ATIVAN) injection 1 mg  1 mg Intramuscular Q4H PRN Rosemarie Ax, MD   1 mg at 09/28/14 1132  . metoprolol tartrate (LOPRESSOR) tablet 25 mg  25 mg Oral BID Rosemarie Ax, MD   25 mg at 09/28/14 0344  . pantoprazole (PROTONIX) EC tablet 40 mg  40 mg Oral Daily Rosemarie Ax, MD   40 mg at 09/28/14 1058  . [START ON 09/29/2014] pneumococcal 23 valent vaccine (PNU-IMMUNE) injection 0.5 mL  0.5 mL Intramuscular Tomorrow-1000 Zenia Resides, MD      . predniSONE (DELTASONE) tablet 50 mg  50 mg Oral Q breakfast Rosemarie Ax, MD   50 mg at 09/28/14 1059  . rivaroxaban (XARELTO) tablet 20 mg  20 mg Oral Q supper Rosemarie Ax, MD      . senna-docusate (Senokot-S) tablet 1 tablet  1 tablet Oral QHS PRN Rosemarie Ax, MD      . sodium chloride 0.9 % injection 3 mL  3 mL Intravenous Q12H Rosemarie Ax, MD   3 mL at 09/28/14 1000  . sodium chloride 0.9 % injection 3 mL  3 mL Intravenous Q12H Rosemarie Ax, MD   3 mL at 09/28/14 0340  . sodium chloride 0.9 % injection 3 mL  3 mL Intravenous PRN Rosemarie Ax, MD      . zolpidem (AMBIEN) tablet 5 mg  5 mg Oral QHS PRN Jerod Mcquain        Musculoskeletal: Strength & Muscle Tone: within normal limits Gait & Station: normal Patient leans: N/A  Psychiatric Specialty Exam: Physical Exam  Psychiatric: Her speech is normal. Judgment normal. Her mood appears anxious. She is slowed. Cognition and memory are normal. She exhibits a depressed mood. She expresses suicidal ideation.    Review of Systems  Constitutional: Negative.   HENT: Negative.   Eyes: Negative.    Respiratory: Positive for shortness of breath and wheezing.   Cardiovascular: Negative.   Gastrointestinal: Negative.   Genitourinary: Negative.   Musculoskeletal: Positive for myalgias.  Skin: Negative.   Neurological: Negative.   Endo/Heme/Allergies: Negative.   Psychiatric/Behavioral: Positive for depression and suicidal ideas.  The patient is nervous/anxious and has insomnia.     Blood pressure 94/57, pulse 127, temperature 98.5 F (36.9 C), temperature source Oral, resp. rate 18, weight 119.2 kg (262 lb 12.6 oz), SpO2 99 %.Body mass index is 43.73 kg/(m^2).  General Appearance: Casual  Eye Contact::  Good  Speech:  Clear and Coherent  Volume:  Decreased  Mood:  Anxious, Depressed and Hopeless  Affect:  Constricted  Thought Process:  Goal Directed  Orientation:  Full (Time, Place, and Person)  Thought Content:  Negative  Suicidal Thoughts:  Yes.  without intent/plan  Homicidal Thoughts:  No  Memory:  Immediate;   Good Recent;   Good Remote;   Good  Judgement:  Fair  Insight:  Shallow  Psychomotor Activity:  Decreased  Concentration:  Good  Recall:  Good  Fund of Knowledge:Good  Language: Good  Akathisia:  No  Handed:  Right  AIMS (if indicated):     Assets:  Communication Skills Desire for Improvement Talents/Skills  ADL's:  Intact  Cognition: WNL  Sleep:   poor   Medical Decision Making: Review or order clinical lab tests (1), Established Problem, Worsening (2), Review of Medication Regimen & Side Effects (2) and Review of New Medication or Change in Dosage (2)  Treatment Plan Summary: Daily contact with patient to assess and evaluate symptoms and progress in treatment. Medication management: Initiate Cymbalta 70m daily and Increase Abilify to 528mQhs  for depression.  Plan:  Recommend psychiatric Inpatient admission when medically cleared. Disposition: Social worker to seek inpatient placement for the patient.  AkCorena PilgrimMD 09/28/2014 1:25 PM

## 2014-09-28 NOTE — Progress Notes (Signed)
Family Medicine Teaching Service Daily Progress Note Intern Pager: [phone]  Patient name: [name] White Medical record number: [id number] Date of birth: 1956-05-28 Age: 59 y.o. Gender: female  Primary Care Provider: Howard Pouch, DO Consultants: Psychiatry Code Status: Fulll  Pt Overview and Major Events to Date:  4/3: Breathing improved, severely depressed state 4/4 Psych consulted  Assessment and Plan:  [name] White is a 59 y.o. female presenting with asthma exacerbation and panic attack. PMH is significant for persistent asthma vs COPD in former smoker, depression, history of PE, HTN, SVT, hypothyroidism, LE edema, obesity, bilateral knee OA, recent dx with multiple PEs 12/2013.  #Asthma Exacerbation: thought to be secondary to the distress caused by her SI. Doesn't appear to have any infection. CXR showing no acute findings. She sounded well on exam with good air movement.  - Duonebs q4/albuterol q2PRN - Prednisolone 50 mg - S/p mag x1 - Continue home symbicort - consider adding singulair - Satting well on 2 L, wean O2 as tolerated   #SI: history of Depression/Anxiety. She had a similar episode in September 2015. Psych was consulted and she was transferred to [facility] for inpatient care. As she has not taken anything for depression/anxiety since October, 2015. She was previously on Cymbalta which was stopped secondary to presumed interaction with her flecainide - S/p consult to Psychiatry for med recs in setting of worsening depression/anxiety and difficulty adjusting to stressful life events - f/u recs - suicide prevention protocol  - Ativan Q4 PRN for anxiety   #Chest pain: atypical in nature. Reproducible upon exam. WIll rule out ACS  - trend troponins - repeat EKG in AM  #Hypokalemia: K 2.6, with no EKG changes c/w hypokalemia - F/u mag - Will replete with oral K - Repeat BMP in AM  # History of PE: tachycardic on exam and shortness of breath but  improvement with albuterol. Wells Criteria 3 for tachycardia and history of PE, of further concerning s/s oe PE will consider CT PE protocol  - Continue home Xarelto -Reports taking as prescribed   # HTN: well controlled  - Continue home metoprolol, HCTZ  # H/o SVT: followed by Dr. Lovena Le.  - Flecainide, metoprolol  #Hypothyoidism: TSH 01/2014 wnl, not currently on meds  #FEN/GI:  -heart healthy diet/saline lock IV   Prophylaxis:  -Continue home Xarelto  Disposition: Improved asmtha exacerbation, stable for discharge.   Subjective:  Sitter present in room Feeling improved breathing today, hopeful to get treatment and improve depression/anxiety Objective: Temp:  [97.8 F (36.6 C)-99.5 F (37.5 C)] 98.5 F (36.9 C) (04/03 0435) Pulse Rate:  [80-149] 127 (04/03 0435) Resp:  [10-26] 18 (04/03 0435) BP: (94-160)/(42-119) 94/57 mmHg (04/03 0435) SpO2:  [94 %-100 %] 99 % (04/03 0435) Weight:  [262 lb 12.6 oz (119.2 kg)] 262 lb 12.6 oz (119.2 kg) (04/03 0145) Physical Exam: General: NAD,  Cardiac: Tachycardic, 2/6 systolic murmur Respiratory: CTAB, normal effort, no wheezes Abdomen: soft, nontender, nondistended, no hepatic or splenomegaly. Bowel sounds present Skin: warm and dry, no rashes noted Neuro: alert and oriented, no focal deficits MSK: Mild tenderness to palpation over right and central chest Psych: pleasant affect,  Laboratory:  Recent Labs Lab 09/27/14 2010  WBC 12.6*  HGB 13.6  HCT 42.0  PLT 395    Recent Labs Lab 09/27/14 2010  NA 140  K 2.6*  CL 103  CO2 23  BUN 12  CREATININE 1.02  CALCIUM 9.3  GLUCOSE 146*  [name] Bourbon, MD 09/28/2014, 9:18 AM

## 2014-09-28 NOTE — Discharge Summary (Signed)
[facility] Discharge Summary  Patient name: [name] White Medical record number: [id number] Date of birth: Sep 14, 1955 Age: 59 y.o. Gender: female Date of Admission: 09/27/2014  Date of Discharge: 09/30/2014 Admitting Physician: Zenia Resides, MD  Primary Care Provider: Howard Pouch, DO Consultants: Psychiatry  Indication for Hospitalization: Asthma exacerbation, Suicidal Ideation  Discharge Diagnoses/Problem List:  Patient Active Problem List   Diagnosis Date Noted  . Asthma with acute exacerbation   . Asthma with status asthmaticus in adult   . SOB (shortness of breath)   . Suicidal ideation 09/27/2014  . MDD (major depressive disorder), recurrent severe, without psychosis 03/05/2014  . Narrow complex tachycardia 01/31/2014  . Sustained SVT 01/31/2014  . Pulmonary embolism 12/27/2013  . Vocal cord dysfunction 11/12/2013  . Lower extremity edema 09/18/2013  . Rotator cuff impingement syndrome of right shoulder 09/18/2013  . Mixed headache 01/06/2012  . Hypothyroidism 11/01/2011  . OVERACTIVE BLADDER 04/18/2008  . OSTEOARTHRITIS, KNEES, BILATERAL 04/18/2008  . POLYARTHRITIS 09/14/2007  . Obesity 08/24/2006  . DEPRESSIVE DISORDER, NOS 08/24/2006  . RESTLESS LEGS SYNDROME 08/24/2006  . HYPERTENSION, BENIGN SYSTEMIC 08/24/2006  . RHINITIS, ALLERGIC 08/24/2006  . ASTHMA, PERSISTENT 08/24/2006  . GASTROESOPHAGEAL REFLUX, NO ESOPHAGITIS 08/24/2006     Disposition: To Inpatient Psychiatric Care at [facility]  Discharge Condition: Stable  Discharge Exam:   Objective: Temp: [98.2 F (36.8 C)-98.8 F (37.1 C)] 98.2 F (36.8 C) (04/04 0809) Pulse Rate: [64-114] 64 (04/04 0809) Resp: [16-18] 17 (04/04 0809) BP: (85-118)/(58-68) 106/58 mmHg (04/04 0809) SpO2: [92 %-99 %] 92 % (04/04 0809) Weight: [265 lb 3.4 oz (120.3 kg)] 265 lb 3.4 oz (120.3 kg) (04/03 2104) Physical Exam: General: NAD,  Cardiac: Tachycardic, 2/6 systolic  murmur Respiratory: CTAB, normal effort, no wheezes Abdomen: soft, nontender, nondistended, no hepatic or splenomegaly. Bowel sounds present Skin: warm and dry, no rashes noted Neuro: alert and oriented, no focal deficits MSK: Mild tenderness to palpation over right and central chest Psych: pleasant affect,  Brief Hospital Course:     #Suicidal ideation: She has a significant history of history of Depression/Anxiety for which she ws previously managed with cymbalta and abilify.She has alse required two hospitalizations for worsening of her depression and anxiety. She if note stopped staking her abilify and cymbalta in October 2015 as she began to feel better with weight loss and did not think she required them. She began to feel worse again in the setting of multiple life stressors including a recent diagnosis of breast cancer in her daughter and the recent knowledge that she will be unable to afford to stay in her home. On admission she endorsed suicidal ideation with thoughts to kill herself with overdose on pills or by cutting her wrists. She denied having weapons at home. She was admiotted for observation and inpatient psychiatric consultation. She was seen by pscy and Abilify and Lexapro were started for her.  She was not restarted on cymbalta due to it's interaction with the metabolism of the flecainide she takes. However, Lexapro also had a similar interaction with flecainide and was stopped and zoloft was started. She has taken this in the past and tolerated it well. While inpatietn she remained stable but still had episodes of sadness, with watching funny things on TV as a coping mechanism. She had a sitter in the room with her at at times and was maintained on suicide precautions   #Asthma Exacerbation:  This was thought to be secondary to the distress caused by  her SI. She had a negative chest x ray with no fingind of infection. She was managed with duonebs every 4 hours PRN and  albuterol every 2 hours prn, magnesium and prednisone. Her breathing improved by HD 2. She was maintained on predisone to complete a 5 day course. She did not have an O2 requirement    #Chest pain:  Her chest pain resolved while inpatient. It was atypical in that it is reproducible on exam making it likely of musculoskeletal etiology likely however, ACS was evaluated for with negative troponins and EKG.  #Hypokalemia: On admission her potassium level was 2.6, with no EKG changes consistent with hypokalemia. She had repletion with oral potassium and repeat K was 4.4. Magnesium was also check and was normal at 2.1   # History of PE: She had a Wells score of 3 on presentation for her history of PE and tachycardia after recieving albuterol for asthma. Her tachycardia improved with dosing of her home flecainide and metoprolol and remained stable. She had no symptoms concerning for PE while inpatient. She was continued on home Xarelto   # HTN:  -Remained stable while inpatient and her home metoprolol and HCTZ were continued  # H/o SVT:  - She is followed by Dr Lovena Le in Cardiology and was maintained on her home Flecainide, metoprolol.  #Hypothyoidism: TSH 01/2014 wnl, not currently on meds  #FEN/GI:  -She was able to tolerate a heart healthy diet with no issues and did not need supplemental IV fluids   Prophylaxis:  Continued on her home Xarelto  Issues for Follow Up:  1. Psychiatric health  Significant Procedures: None  Significant Labs and Imaging:   Recent Labs Lab 09/27/14 2010  WBC 12.6*  HGB 13.6  HCT 42.0  PLT 395    Recent Labs Lab 09/27/14 2010  NA 140  K 2.6*  CL 103  CO2 23  GLUCOSE 146*  BUN 12  CREATININE 1.02  CALCIUM 9.3  MG 2.1      Results/Tests Pending at Time of Discharge: None  Discharge Medications:    Medication List    ASK your doctor about these medications        albuterol 108 (90 BASE) MCG/ACT inhaler  Commonly known as:   PROVENTIL HFA;VENTOLIN HFA  Inhale 2 puffs into the lungs every 6 (six) hours as needed. For shortness of breath     albuterol (2.5 MG/3ML) 0.083% nebulizer solution  Commonly known as:  PROVENTIL  Take 3 mLs (2.5 mg total) by nebulization every 6 (six) hours as needed. For shortness of breath     azelastine 0.05 % ophthalmic solution  Commonly known as:  OPTIVAR  Place 1 drop into both eyes 2 (two) times daily as needed. For dry/irritated eyes     budesonide-formoterol 160-4.5 MCG/ACT inhaler  Commonly known as:  SYMBICORT  Inhale 2 puffs into the lungs 2 (two) times daily.     DEXILANT 60 MG capsule  Generic drug:  dexlansoprazole  Take 60 mg by mouth every morning.     diclofenac sodium 1 % Gel  Commonly known as:  VOLTAREN  Apply 2 g topically 2 (two) times daily as needed (For pain or inflammation on knees.).     flecainide 100 MG tablet  Commonly known as:  TAMBOCOR  Take 1 tablet (100 mg total) by mouth every 12 (twelve) hours.     hydrochlorothiazide 25 MG tablet  Commonly known as:  HYDRODIURIL  Take 1 tablet (25 mg total) by  mouth every morning.     metoprolol 50 MG tablet  Commonly known as:  LOPRESSOR  Take 0.5 tablets (25 mg total) by mouth 2 (two) times daily.     Olopatadine HCl 0.6 % Soln  Place 1 puff into the nose at bedtime as needed (For seasonal allergies.).     rivaroxaban 20 MG Tabs tablet  Commonly known as:  XARELTO  Take 1 tablet (20 mg total) by mouth daily with supper.        Discharge Instructions: Please refer to Patient Instructions section of EMR for full details.  Patient was counseled important signs and symptoms that should prompt return to medical care, changes in medications, dietary instructions, activity restrictions, and follow up appointments.   Follow-Up Appointments:  Please schedule a follow up appointment with Family Medicine at [phone] on discharge from [place], MD 09/28/2014, 8:17 AM PGY-1,  [place]

## 2014-09-28 NOTE — ED Notes (Signed)
Staffing notified of need for sitter.  

## 2014-09-28 NOTE — ED Notes (Signed)
This RN started a fluid bolus to attempt to raise the pt's blood pressure.

## 2014-09-29 DIAGNOSIS — J45901 Unspecified asthma with (acute) exacerbation: Secondary | ICD-10-CM | POA: Insufficient documentation

## 2014-09-29 DIAGNOSIS — J45902 Unspecified asthma with status asthmaticus: Secondary | ICD-10-CM | POA: Diagnosis not present

## 2014-09-29 MED ORDER — MENTHOL 3 MG MT LOZG
1.0000 | LOZENGE | OROMUCOSAL | Status: DC | PRN
Start: 2014-09-29 — End: 2014-10-01
  Administered 2014-09-29: 3 mg via ORAL
  Filled 2014-09-29: qty 9

## 2014-09-29 MED ORDER — CITALOPRAM HYDROBROMIDE 20 MG PO TABS
20.0000 mg | ORAL_TABLET | Freq: Every day | ORAL | Status: DC
  Administered 2014-09-29: 20 mg via ORAL
  Filled 2014-09-29 (×2): qty 1

## 2014-09-29 NOTE — Clinical Social Work Psych Note (Addendum)
Psych CSW received consult for possible inpatient psychiatric placement once medically cleared.  Per MD Hanley's documentation, patient is medically stable and ready for discharge.  Psych CSW begin search for inpatient psychiatric placement.  Psych CSW sent referrals to the following facilities: [facility]- referral sent- at capacity [facility]- referral sent [facility]- referral sent- at [place]- referral sent- no sponsorship beds available [facility]- denied medical acuity [facility]- denied "patient does not meet criteria" [facility]- referral sent- at [place]- referral sent- at [place]- referral sent- denied due to medical acuity  Psych CSW will continue to assist with disposition/placement.  Nonnie Done, [place] [phone]  Psychiatric & Orthopedics (5N 1-8) Clinical Social Worker

## 2014-09-29 NOTE — Progress Notes (Signed)
Family Medicine Teaching Service Daily Progress Note Intern Pager: [phone]  Patient name: [name] White Medical record number: [id number] Date of birth: [birth year] Age: 59 y.o. Gender: female  Primary Care Provider: Howard Pouch, DO Consultants: Psychiatry Code Status: [place]  Pt Overview and Major Events to Date:  4/3: Breathing improved, severely depressed state 4/4 Psych consulted  Assessment and Plan:  [name] White is a 59 y.o. female presenting with asthma exacerbation and panic attack. PMH is significant for persistent asthma vs COPD in former smoker, depression, history of PE, HTN, SVT, hypothyroidism, LE edema, obesity, bilateral knee OA, recent dx with multiple PEs 12/2013.  #Asthma Exacerbation: thought to be secondary to the distress caused by her SI. Doesn't appear to have any infection. CXR showing no acute findings. She sounded well on exam with good air movement.  - Duonebs q4/albuterol q2PRN - Prednisolone 50 mg - S/p mag x1 - Continue home symbicort - consider adding singulair - Satting well on 2 L, wean O2 as tolerated - Now stable   #SI: history of Depression/Anxiety. She had a similar episode in September 2015. Psych was consulted and she was [facility] to [facility] inpatient care. As she has not taken anything for depression/anxiety since October, 2015. She was previously on Cymbalta which was stopped secondary to presumed interaction with her flecainide - S/p consult to Psychiatry - plan for inpatient therapy: woking on placement - Rec to start Abilify and SSRI; will start Abilify with celexa - suicide precautions with sitter in room  #Chest pain: Atypical in that it is reproducible on exam making MSK etiology likely, but ACS was evaluated for with negative work up  - negative troponins and EKG  #Hypokalemia: K 2.6, with no EKG changes c/w hypokalemia - Repeat K 4.4 after Oral repletion -Mag 2.1,   # History of PE: tachycardic on exam  and shortness of breath but improvement with albuterol. Wells Criteria 3 for tachycardia and history of PE, of further concerning s/s oe PE will consider CT PE protocol  - Continue home Xarelto -Reports taking as prescribed   # HTN: well controlled  - Continue home metoprolol, HCTZ  # H/o SVT: followed by Dr. Lovena Le.  - Flecainide, metoprolol  #Hypothyoidism: TSH 01/2014 wnl, not currently on meds  #FEN/GI:  -heart healthy diet/saline lock IV   Prophylaxis:  -Continue home Xarelto  Disposition: Improved asmtha exacerbation, stable for discharge.   Subjective:  Sitter present in room Feeling improved breathing today, hopeful to get treatment and improve depression/anxiety Objective: Temp:  [98.2 F (36.8 C)-98.8 F (37.1 C)] 98.2 F (36.8 C) (04/04 0809) Pulse Rate:  [64-114] 64 (04/04 0809) Resp:  [16-18] 17 (04/04 0809) BP: (85-118)/(58-68) 106/58 mmHg (04/04 0809) SpO2:  [92 %-99 %] 92 % (04/04 0809) Weight:  [265 lb 3.4 oz (120.3 kg)] 265 lb 3.4 oz (120.3 kg) (04/03 2104) Physical Exam: General: NAD,  Cardiac: Tachycardic, 2/6 systolic murmur Respiratory: CTAB, normal effort, no wheezes Abdomen: soft, nontender, nondistended, no hepatic or splenomegaly. Bowel sounds present Skin: warm and dry, no rashes noted Neuro: alert and oriented, no focal deficits MSK: Mild tenderness to palpation over right and central chest Psych: pleasant affect,  Laboratory:  Recent Labs Lab 09/27/14 2010 09/28/14 0825  WBC 12.6* 10.5  HGB 13.6 12.1  HCT 42.0 37.6  PLT 395 400    Recent Labs Lab 09/27/14 2010 09/28/14 0825  NA 140 135  K 2.6* 4.4  CL 103 104  CO2 23 22  BUN 12 12  CREATININE 1.02 0.82  CALCIUM 9.3 8.6  GLUCOSE 146* 159*      [name] Bourbon, MD 09/29/2014, 8:43 AM

## 2014-09-29 NOTE — Consult Note (Signed)
Psychiatry Consult follow-up note  Reason for Consult:  Depression, anxiety Suicidal thoughts Referring Physician: Dr. Jerline Pain Patient Identification: [name] White MRN:  [id number] Principal Diagnosis: MDD (major depressive disorder), recurrent severe, without psychosis Diagnosis:   Patient Active Problem List   Diagnosis Date Noted  . Asthma with status asthmaticus in adult [J45.902]   . SOB (shortness of breath) [R06.02]   . Suicidal ideation [R45.851] 09/27/2014  . MDD (major depressive disorder), recurrent severe, without psychosis [F33.2] 03/05/2014  . Narrow complex tachycardia [I47.1] 01/31/2014  . Sustained SVT [I47.1] 01/31/2014  . Pulmonary embolism [I26.99] 12/27/2013  . Vocal cord dysfunction [J38.3] 11/12/2013  . Lower extremity edema [R60.0] 09/18/2013  . Rotator cuff impingement syndrome of right shoulder [M75.101] 09/18/2013  . Mixed headache [R51] 01/06/2012  . Hypothyroidism [E03.9] 11/01/2011  . OVERACTIVE BLADDER [N31.8] 04/18/2008  . OSTEOARTHRITIS, KNEES, BILATERAL [M17.9] 04/18/2008  . POLYARTHRITIS [M13.0] 09/14/2007  . Obesity [E66.9] 08/24/2006  . DEPRESSIVE DISORDER, NOS [F32.9] 08/24/2006  . RESTLESS LEGS SYNDROME [G25.89] 08/24/2006  . HYPERTENSION, BENIGN SYSTEMIC [I10] 08/24/2006  . RHINITIS, ALLERGIC [J30.9] 08/24/2006  . ASTHMA, PERSISTENT [J45.909] 08/24/2006  . GASTROESOPHAGEAL REFLUX, NO ESOPHAGITIS [K21.9] 08/24/2006    Total Time spent with patient: 30 minutes  Subjective:   [name] White is a 59 y.o. female patient admitted with shortness of breath and Asthma exercebation.  HPI: Thanks for asking me to see this patient for psychiatric consult. Patient is a 59 year old woman with multiple medical problem and past history of Major depressive disorder. Patient reports that she has been getting increasingly more depressed since her 34 year old daughter was diagnosed with stage 2 breast cancer few weeks ago. She also reports increased  anxiety, worries, apprehensions, low energy level, anhedonia and suicidal thoughts . Patient reports prior history of suicidal thoughts in 25-Aug-2009 after her son died, she was admitted to [facility] for stabilization. Patient also reports recurrent panic attacks for that past few days, she endorses at least 2 episodes in the last few days characterized by shortness of breath, chest tightness, cold sweating and diaphoresis. Patient is employed, lives with a room mate who is relocating to the [place] area soon. As a result, she has become overwhelmed thinking about being lonely and not being to pay her bills. Patient denies drugs/alcohol abuse, delusional thinking or psychosis. She will benefit from psychiatric inpatient admission as she is unable to contract for safety. Patient normally gets her outpatient care at [facility] where she is placed on Cymbalta 78m daily  and Abilify 276mqhs.  Interval history: Patient appeared lying down in her bed, calm cooperative and pleasant during this evaluation. Patient has a saAir cabin crewext to her bed. Patient started saying that she's been feeling better since he has a saAir cabin crewnd restarting her home medication during this weekend. Patient continued to have symptoms of depression, anxiety and suicidal thoughts. Patient is willing to participate acute psychiatric hospitalization when medically cleared. Patient also reported noncompliant with her medication about 2 months before she become depressed and suicidal. Patient is stressed about her health problems in her family had problems. Patient stated she is a [employer] HPI Elements:   Location:  suicidal, depressed. Quality:  severe. Duration:  few weeks. Context:  family issues.  Past Medical History:  Past Medical History  Diagnosis Date  . Asthma   . Hypertension   . GERD (gastroesophageal reflux disease)   . Anxiety   . Depression   .  Family history of anesthesia  complication     "daughter; causes her to pass out afterwards"  . Pulmonary embolism 12/28/2013    "2 clots in each lung"  . Hyperthyroidism   . Migraine     "monthly" (12/28/2013)  . Osteoarthritis     "both knees; back of my neck; right pelvic bone" (12/28/2013)  . Chronic lower back pain     Past Surgical History  Procedure Laterality Date  . Abdominal hysterectomy    . Wisdom tooth extraction    . Thyroidectomy, partial Right 2005  . Nasal sinus surgery  2007  . Appendectomy    . Excisional hemorrhoidectomy    . Dilation and curettage of uterus    . Tubal ligation    . Breast cyst excision Right    Family History:  Family History  Problem Relation Age of Onset  . Osteoarthritis Mother   . Asthma Mother   . Heart failure Mother    Social History:  History  Alcohol Use  . Yes    Comment: "drank some in my 30's"     History  Drug Use No    History   Social History  . Marital Status: Single    Spouse Name: N/A  . Number of Children: N/A  . Years of Education: N/A   Social History Main Topics  . Smoking status: Former Smoker -- 0.25 packs/day for 20 years    Types: Cigarettes    Quit date: 07/17/1999  . Smokeless tobacco: Never Used  . Alcohol Use: Yes     Comment: "drank some in my 30's"  . Drug Use: No  . Sexual Activity: No   Other Topics Concern  . None   Social History Narrative   Additional Social History:                          Allergies:   Allergies  Allergen Reactions  . Caffeine Other (See Comments)    Migraine  . Vicodin [Hydrocodone-Acetaminophen] Nausea And Vomiting and Other (See Comments)    Headache   . Ciprofloxacin Hives  . Erythromycin Hives  . Lisinopril Cough  . Oxycodone Nausea And Vomiting    headache  . Sulfamethoxazole-Trimethoprim Rash    Labs:  Results for orders placed or performed during the hospital encounter of 09/27/14 (from the past 48 hour(s))  CBC with Differential/Platelet     Status: Abnormal    Collection Time: 09/27/14  8:10 PM  Result Value Ref Range   WBC 12.6 (H) 4.0 - 10.5 K/uL   RBC 4.68 3.87 - 5.11 MIL/uL   Hemoglobin 13.6 12.0 - 15.0 g/dL   HCT 42.0 36.0 - 46.0 %   MCV 89.7 78.0 - 100.0 fL   MCH 29.1 26.0 - 34.0 pg   MCHC 32.4 30.0 - 36.0 g/dL   RDW 16.7 (H) 11.5 - 15.5 %   Platelets 395 150 - 400 K/uL   Neutrophils Relative % 70 43 - 77 %   Neutro Abs 8.9 (H) 1.7 - 7.7 K/uL   Lymphocytes Relative 26 12 - 46 %   Lymphs Abs 3.2 0.7 - 4.0 K/uL   Monocytes Relative 3 3 - 12 %   Monocytes Absolute 0.3 0.1 - 1.0 K/uL   Eosinophils Relative 1 0 - 5 %   Eosinophils Absolute 0.2 0.0 - 0.7 K/uL   Basophils Relative 0 0 - 1 %   Basophils Absolute 0.0 0.0 - 0.1 K/uL  Brain natriuretic peptide     Status: None   Collection Time: 09/27/14  8:10 PM  Result Value Ref Range   B Natriuretic Peptide 50.9 0.0 - 100.0 pg/mL  Basic metabolic panel     Status: Abnormal   Collection Time: 09/27/14  8:10 PM  Result Value Ref Range   Sodium 140 135 - 145 mmol/L   Potassium 2.6 (LL) 3.5 - 5.1 mmol/L    Comment: REPEATED TO VERIFY CRITICAL RESULT CALLED TO, READ BACK BY AND VERIFIED WITH: DOSS C,RN 09/27/14 2103 WAYK    Chloride 103 96 - 112 mmol/L   CO2 23 19 - 32 mmol/L   Glucose, Bld 146 (H) 70 - 99 mg/dL   BUN 12 6 - 23 mg/dL   Creatinine, Ser 1.02 0.50 - 1.10 mg/dL   Calcium 9.3 8.4 - 10.5 mg/dL   GFR calc non Af Amer 59 (L) >90 mL/min   GFR calc Af Amer 68 (L) >90 mL/min    Comment: (NOTE) The eGFR has been calculated using the CKD EPI equation. This calculation has not been validated in all clinical situations. eGFR's persistently <90 mL/min signify possible Chronic Kidney Disease.    Anion gap 14 5 - 15  Magnesium     Status: None   Collection Time: 09/27/14  8:10 PM  Result Value Ref Range   Magnesium 2.1 1.5 - 2.5 mg/dL  MRSA PCR Screening     Status: None   Collection Time: 09/28/14  3:04 AM  Result Value Ref Range   MRSA by PCR NEGATIVE NEGATIVE     Comment:        The GeneXpert MRSA Assay (FDA approved for NASAL specimens only), is one component of a comprehensive MRSA colonization surveillance program. It is not intended to diagnose MRSA infection nor to guide or monitor treatment for MRSA infections.   CBC     Status: Abnormal   Collection Time: 09/28/14  8:25 AM  Result Value Ref Range   WBC 10.5 4.0 - 10.5 K/uL   RBC 4.18 3.87 - 5.11 MIL/uL   Hemoglobin 12.1 12.0 - 15.0 g/dL   HCT 37.6 36.0 - 46.0 %   MCV 90.0 78.0 - 100.0 fL   MCH 28.9 26.0 - 34.0 pg   MCHC 32.2 30.0 - 36.0 g/dL   RDW 17.0 (H) 11.5 - 15.5 %   Platelets 400 150 - 400 K/uL  Basic metabolic panel     Status: Abnormal   Collection Time: 09/28/14  8:25 AM  Result Value Ref Range   Sodium 135 135 - 145 mmol/L   Potassium 4.4 3.5 - 5.1 mmol/L   Chloride 104 96 - 112 mmol/L   CO2 22 19 - 32 mmol/L   Glucose, Bld 159 (H) 70 - 99 mg/dL   BUN 12 6 - 23 mg/dL   Creatinine, Ser 0.82 0.50 - 1.10 mg/dL   Calcium 8.6 8.4 - 10.5 mg/dL   GFR calc non Af Amer 77 (L) >90 mL/min   GFR calc Af Amer 89 (L) >90 mL/min    Comment: (NOTE) The eGFR has been calculated using the CKD EPI equation. This calculation has not been validated in all clinical situations. eGFR's persistently <90 mL/min signify possible Chronic Kidney Disease.    Anion gap 9 5 - 15  Troponin I     Status: None   Collection Time: 09/28/14  8:25 AM  Result Value Ref Range   Troponin I <0.03 <0.031 ng/mL    Comment:  NO INDICATION OF MYOCARDIAL INJURY.   Troponin I     Status: None   Collection Time: 09/28/14  4:35 PM  Result Value Ref Range   Troponin I <0.03 <0.031 ng/mL    Comment:        NO INDICATION OF MYOCARDIAL INJURY.     Vitals: Blood pressure 106/58, pulse 64, temperature 98.2 F (36.8 C), temperature source Oral, resp. rate 17, weight 120.3 kg (265 lb 3.4 oz), SpO2 92 %.  Risk to Self: Is patient at risk for suicide?: No Risk to Others:   Prior Inpatient Therapy:    Prior Outpatient Therapy:    Current Facility-Administered Medications  Medication Dose Route Frequency Provider Last Rate Last Dose  . 0.9 %  sodium chloride infusion  250 mL Intravenous PRN Rosemarie Ax, MD      . albuterol (PROVENTIL) (2.5 MG/3ML) 0.083% nebulizer solution 3 mL  3 mL Inhalation Q4H PRN Veatrice Bourbon, MD      . albuterol (PROVENTIL,VENTOLIN) solution continuous neb  10 mg/hr Nebulization Continuous Orlie Dakin, MD   Stopped at 09/27/14 2200  . ARIPiprazole (ABILIFY) tablet 5 mg  5 mg Oral QHS Mojeed Akintayo   5 mg at 09/28/14 2232  . budesonide-formoterol (SYMBICORT) 160-4.5 MCG/ACT inhaler 2 puff  2 puff Inhalation BID Rosemarie Ax, MD   2 puff at 09/29/14 [phone]  . citalopram (CELEXA) tablet 20 mg  20 mg Oral Daily Veatrice Bourbon, MD   20 mg at 09/29/14 1129  . flecainide (TAMBOCOR) tablet 100 mg  100 mg Oral Q12H Zenia Resides, MD   100 mg at 09/29/14 1129  . hydrochlorothiazide (HYDRODIURIL) tablet 25 mg  25 mg Oral q morning - 10a Rosemarie Ax, MD   25 mg at 09/28/14 1100  . ipratropium-albuterol (DUONEB) 0.5-2.5 (3) MG/3ML nebulizer solution 3 mL  3 mL Nebulization Q4H PRN Zenia Resides, MD      . ketotifen (ZADITOR) 0.025 % ophthalmic solution 1 drop  1 drop Both Eyes BID PRN Rosemarie Ax, MD      . LORazepam (ATIVAN) tablet 1 mg  1 mg Oral Q4H PRN Rosemarie Ax, MD   1 mg at 09/29/14 0252   Or  . LORazepam (ATIVAN) injection 1 mg  1 mg Intramuscular Q4H PRN Rosemarie Ax, MD   1 mg at 09/28/14 1132  . menthol-cetylpyridinium (CEPACOL) lozenge 3 mg  1 lozenge Oral PRN Veatrice Bourbon, MD   3 mg at 09/29/14 0237  . metoprolol tartrate (LOPRESSOR) tablet 25 mg  25 mg Oral BID Rosemarie Ax, MD   25 mg at 09/29/14 0534  . pantoprazole (PROTONIX) EC tablet 40 mg  40 mg Oral Daily Rosemarie Ax, MD   40 mg at 09/29/14 1129  . pneumococcal 23 valent vaccine (PNU-IMMUNE) injection 0.5 mL  0.5 mL Intramuscular Tomorrow-1000 Zenia Resides, MD      . predniSONE (DELTASONE) tablet 50 mg  50 mg Oral Q breakfast Rosemarie Ax, MD   50 mg at 09/29/14 0829  . rivaroxaban (XARELTO) tablet 20 mg  20 mg Oral Q supper Rosemarie Ax, MD   20 mg at 09/28/14 1802  . senna-docusate (Senokot-S) tablet 1 tablet  1 tablet Oral QHS PRN Rosemarie Ax, MD      . sodium chloride 0.9 % injection 3 mL  3 mL Intravenous Q12H Rosemarie Ax, MD   3 mL at 09/29/14 1138  .  sodium chloride 0.9 % injection 3 mL  3 mL Intravenous Q12H Rosemarie Ax, MD   3 mL at 09/29/14 1138  . sodium chloride 0.9 % injection 3 mL  3 mL Intravenous PRN Rosemarie Ax, MD      . zolpidem [facility]) tablet 5 mg  5 mg Oral QHS PRN Mojeed Akintayo   5 mg at 09/28/14 2240    Musculoskeletal: Strength & Muscle Tone: within normal limits Gait & Station: normal Patient leans: N/A  Psychiatric Specialty Exam: Physical Exam  Psychiatric: Her speech is normal. Judgment normal. Her mood appears anxious. She is slowed. Cognition and memory are normal. She exhibits a depressed mood. She expresses suicidal ideation.    Review of Systems  Constitutional: Negative.   HENT: Negative.   Eyes: Negative.   Respiratory: Positive for shortness of breath and wheezing.   Cardiovascular: Negative.   Gastrointestinal: Negative.   Genitourinary: Negative.   Musculoskeletal: Positive for myalgias.  Skin: Negative.   Neurological: Negative.   Endo/Heme/Allergies: Negative.   Psychiatric/Behavioral: Positive for depression and suicidal ideas. The patient is nervous/anxious and has insomnia.     Blood pressure 106/58, pulse 64, temperature 98.2 F (36.8 C), temperature source Oral, resp. rate 17, weight 120.3 kg (265 lb 3.4 oz), SpO2 92 %.Body mass index is 44.13 kg/(m^2).  General Appearance: Casual  Eye Contact::  Good  Speech:  Clear and Coherent  Volume:  Decreased  Mood:  Anxious, Depressed and Hopeless  Affect:  Constricted  Thought Process:  Goal Directed   Orientation:  Full (Time, Place, and Person)  Thought Content:  Negative  Suicidal Thoughts:  Yes.  without intent/plan  Homicidal Thoughts:  No  Memory:  Immediate;   Good Recent;   Good Remote;   Good  Judgement:  Fair  Insight:  Shallow  Psychomotor Activity:  Decreased  Concentration:  Good  Recall:  Good  Fund of Knowledge:Good  Language: Good  Akathisia:  No  Handed:  Right  AIMS (if indicated):     Assets:  Communication Skills Desire for Improvement Talents/Skills  ADL's:  Intact  Cognition: WNL  Sleep:   poor   Medical Decision Making: Review or order clinical lab tests (1), Established Problem, Worsening (2), Review of Medication Regimen & Side Effects (2) and Review of New Medication or Change in Dosage (2)  Treatment Plan Summary: Daily contact with patient to assess and evaluate symptoms and progress in treatment. Medication management:   Case discussed with psychiatric social service  Continue Cymbalta 33m daily for depression Continue Abilify to 539mQhs  for depression and anxiety Appreciate psychiatric consultation and follow up as clinically required Please contact [phone] or [phone] if needs further assistance.  Plan:  Recommend psychiatric Inpatient admission when medically cleared.   Disposition: Social worker to seek inpatient placement for the patient.  JODurward Parcel MD 09/29/2014 3:25 PM

## 2014-09-30 MED ORDER — SERTRALINE HCL 25 MG PO TABS: 25.0000 mg | ORAL_TABLET | Freq: Every day | ORAL | Status: DC

## 2014-09-30 MED ORDER — ARIPIPRAZOLE 5 MG PO TABS: 5.0000 mg | ORAL_TABLET | Freq: Every day | ORAL | Status: DC

## 2014-09-30 MED ORDER — PREDNISONE 50 MG PO TABS: ORAL_TABLET | ORAL | Status: AC

## 2014-09-30 MED ORDER — SENNOSIDES-DOCUSATE SODIUM 8.6-50 MG PO TABS: 1.0000 | ORAL_TABLET | Freq: Every evening | ORAL | Status: DC | PRN

## 2014-09-30 MED ORDER — SERTRALINE HCL 25 MG PO TABS
25.0000 mg | ORAL_TABLET | Freq: Every day | ORAL | Status: DC
  Administered 2014-09-30: 25 mg via ORAL
  Filled 2014-09-30 (×2): qty 1

## 2014-09-30 NOTE — Progress Notes (Signed)
Family Medicine Teaching Service Daily Progress Note Intern Pager: [phone]  Patient name: [name] White Medical record number: [id number] Date of birth: [birth year] Age: 59 y.o. Gender: female  Primary Care Provider: Howard Pouch, DO Consultants: Psychiatry Code Status: [place]  Pt Overview and Major Events to Date:  4/3: Breathing improved, severely depressed state 4/4 Psych consulted 4/5 admitted for inpatient psych  Assessment and Plan:  [name] White is a 59 y.o. female presenting with asthma exacerbation and panic attack. PMH is significant for persistent asthma vs COPD in former smoker, depression, history of PE, HTN, SVT, hypothyroidism, LE edema, obesity, bilateral knee OA, recent dx with multiple PEs 12/2013.  #Asthma Exacerbation: thought to be secondary to the distress caused by her SI. Doesn't appear to have any infection. CXR showing no acute findings. She sounded well on exam with good air movement.  - Duonebs q4/albuterol q2PRN - Prednisolone 50 mg - S/p mag x1 - Continue home symbicort - consider adding singulair - Satting well on 2 L, wean O2 as tolerated - Now stable   #SI: history of Depression/Anxiety. She had a similar episode in September 2015. Psych was consulted and she was transferred to [facility] for inpatient care. As she has not taken anything for depression/anxiety since October, 2015. She was previously on Cymbalta which was stopped secondary to presumed interaction with her flecainide - S/p consult to Psychiatry - plan for inpatient therapy: woking on placement - Rec to start Abilify and SSRI;  - on abilify - celexa with interaction with flecainide, will start Zoloft as it has a better side effect profile and she has tolerated it in the past - suicide precautions with sitter in room  #Chest pain: Atypical in that it is reproducible on exam making MSK etiology likely, but ACS was evaluated for with negative work up  - negative troponins and  EKG - improved  #Hypokalemia: K 2.6, with no EKG changes c/w hypokalemia - Repeat K 4.4 after Oral repletion -Mag 2.1,   # History of PE: tachycardic on exam and shortness of breath but improvement with albuterol. Wells Criteria 3 for tachycardia and history of PE, of further concerning s/s oe PE will consider CT PE protocol  - Continue home Xarelto -Reports taking as prescribed   # HTN: well controlled  - Continue home metoprolol, HCTZ  # H/o SVT: followed by Dr. Lovena Le.  - Flecainide, metoprolol  #Hypothyoidism: TSH 01/2014 wnl, not currently on meds  #FEN/GI:  -heart healthy diet/saline lock IV   Prophylaxis:  -Continue home Xarelto  Disposition: Improved asmtha exacerbation, stable for discharge to inpatient psych   Subjective:  Sitter present in room Feeling improved breathing today, hopeful to get treatment and improve depression/anxiety. She did have an episode of unhappiness last night but resolved aftter watching funny movies to cheer herself up Objective: Temp:  [97.5 F (36.4 C)-98.9 F (37.2 C)] 98.9 F (37.2 C) (04/05 0532) Pulse Rate:  [60-129] 129 (04/05 0532) Resp:  [18-20] 20 (04/05 0532) BP: (97-121)/(54-73) 117/71 mmHg (04/05 0532) SpO2:  [94 %-100 %] 100 % (04/05 0532) Physical Exam: General: NAD,  Cardiac: Tachycardic, 2/6 systolic murmur Respiratory: CTAB, normal effort, no wheezes Abdomen: soft, nontender, nondistended, no hepatic or splenomegaly. Bowel sounds present Skin: warm and dry, no rashes noted Neuro: alert and oriented, no focal deficits MSK: Mild tenderness to palpation over right and central chest Psych: pleasant affect,  Laboratory:  Recent Labs Lab 09/27/14 2010 09/28/14 0825  WBC 12.6* 10.5  HGB  13.6 12.1  HCT 42.0 37.6  PLT 395 400    Recent Labs Lab 09/27/14 2010 09/28/14 0825  NA 140 135  K 2.6* 4.4  CL 103 104  CO2 23 22  BUN 12 12  CREATININE 1.02 0.82  CALCIUM 9.3 8.6  GLUCOSE 146* 159*       [name] Bourbon, MD 09/30/2014, 9:19 AM

## 2014-09-30 NOTE — Clinical Social Work Psych Assess (Signed)
Clinical Social Work Department CLINICAL SOCIAL WORK PSYCHIATRY SERVICE LINE ASSESSMENT 09/30/2014  Patient:  [name] White  Account:  [id number]  [place] Date:  09/27/2014  Clinical Social Worker:  Wylene Men  Date/Time:  09/29/2014 02:02 PM Referred by:  Physician  Date referred:  09/29/2014 Reason for Referral  Crisis Intervention  Psychosocial assessment   Presenting Symptoms/Problems (In the person's/family's own words):   Psychiatry was consulted per patient's request for worsening sx of depression and SI.   Abuse/Neglect/Trauma History (check all that apply)  Denies history   Abuse/Neglect/Trauma Comments:   none reported  patient denies   Psychiatric History (check all that apply)  Inpatient/hospitilization  Outpatient treatment   Psychiatric medications:  Cymbalta 47m daily  Abilify 242mQHS   Current Mental Health Hospitalizations/Previous Mental Health History:   [facility]  [facility] Current provider:   [facility] psychiatric follow-up and medication mangement   Place and Date:   ongoing   Current Medications:   albuterol (PROVENTIL HFA;VENTOLIN HFA) 108 (90 BASE) MCG/ACT inhaler  Inhale 2 puffs into the lungs every 6 (six) hours as needed. For shortness of breath        .  albuterol (PROVENTIL) (2.5 MG/3ML) 0.083% nebulizer solution  Take 3 mLs (2.5 mg total) by nebulization every 6 (six) hours as needed. For shortness of breath  75 mL  12   .  azelastine (OPTIVAR) 0.05 % ophthalmic solution  Place 1 drop into both eyes 2 (two) times daily as needed. For dry/irritated eyes       .  budesonide-formoterol (SYMBICORT) 160-4.5 MCG/ACT inhaler  Inhale 2 puffs into the lungs 2 (two) times daily.         . Marland Kitchendexlansoprazole (DEXILANT) 60 MG capsule  Take 60 mg by mouth every morning.        .  diclofenac sodium (VOLTAREN) 1 % GEL  Apply 2 g topically 2 (two) times daily as needed (For pain or inflammation on knees.).        . Marland Kitchenflecainide  (TAMBOCOR) 100 MG tablet  Take 1 tablet (100 mg total) by mouth every 12 (twelve) hours.  180 tablet  3   .  hydrochlorothiazide (HYDRODIURIL) 25 MG tablet  Take 1 tablet (25 mg total) by mouth every morning.  90 tablet  3   .  metoprolol (LOPRESSOR) 50 MG tablet  Take 0.5 tablets (25 mg total) by mouth 2 (two) times daily.  180 tablet  3   .  Olopatadine HCl 0.6 % SOLN  Place 1 puff into the nose at bedtime as needed (For seasonal allergies.).        . Marland Kitchenrivaroxaban (XARELTO) 20 MG TABS tablet  Take 1 tablet (20 mg total) by mouth daily with supper.  30 tablet  11    Previous Impatient Admission/Date/Reason:   2/4- [facility]- pain- dc'd  03/05/14- [facility] medical clearance - dc'd to [facility]  [facility]/14- [facility] - peritonsillar abscess  01/29/14- [facility]- sore throat- dc'd  12/27/13- [facility]- respiratory distress- dc'd  12/23/13- [facility] ED ankle pain - dc'd  11/20/13- [facility]- right leg pain  08/03/13- [facility]- shoulder pain   Emotional Health / Current Symptoms    Suicide/Self Harm  Suicidal ideation (ex: "I can't take any more,I wish I could disappear")  Has a plan for suicide   Suicide attempt in the past:   no known  suicide attempts in the past though patient reports having chronic thoughts of suicide.   Other harmful behavior:   none known   Psychotic/Dissociative Symptoms  None reported   Other Psychotic/Dissociative Symptoms:   none reported, witnessed or noted in the chart    Attention/Behavioral Symptoms  Within Normal Limits   Other Attention / Behavioral Symptoms:   none reported, witnessed or noted in the chart    Cognitive Impairment  Orientation - Place  Orientation - Self  Orientation - Situation  Orientation - Time   Other Cognitive Impairment:   none reported, witnessed or noted in the chart    Mood and Adjustment  DEPRESSION  Anxious    Stress, Anxiety, Trauma, Any Recent Loss/Stressor  Anxiety  Grief/Loss  (recent or history)  Other - See comment   Anxiety (frequency):   patient has lost her son (died 08-28-2009)  patient has recently learned (approx 3 weeks ago) that her daughter has breast cancer  patient has roommate who has plans to move to [facility]- patient expresses anxiety regarding ability to pay bills once roommate is no longer assisting   Phobia (specify):   none reported or noted in the chart   Compulsive behavior (specify):   none reported or noted in the chart   Obsessive behavior (specify):   none reported or noted in the chart   Other:   none reported or noted in the chart   Substance Abuse/Use  None   SBIRT completed (please refer for detailed history):  N  Self-reported substance use:   patient denies SA/ETOH use/abuse   Urinary Drug Screen Completed:  N Alcohol level:   untested    Environmental/Housing/Living Arrangement  Stable housing   Who is in the home:   roommate   Emergency contact:  Joycelyn Schmid (church member)   Product manager  Medicaid   Patient's Strengths and Goals (patient's own words):   Patient has supportive friends, Insurance underwriter, stable housing, mode of transportation and is set up/complaint with medical/psychiatric appointments   Clinical Social Worker's Interpretive Summary:   Psych CSW met with patient at bedside.  Patient reports having recurrent thoughts of suicide since Aug 28, 2009.  Patient reports having a plan of overdose and confirms means.    Patient has had two mental health hospitalizations since 08-28-09.  Once, after the death of her son and the other was 19.  Aug 28, 2009 she was dc'd to [facility] and 08/28/2013 she was dc'd to [facility].  Patient reports having outpatient mental health services set up at [facility] where she receives counseling and medication management.  Patient denies having an ACTT team set up at this time and denies the need for one.    Patient denies AVHD.  Patient denies mania, but endorses sx of  depression: anhedonia, recurrent thoughts of sucide, feelings of worthlessness and hopelessness.  Patient reports triggers: loss of son, daughter being dx with breast cancer and roommate re-lcoating to [place].  Patient reports not being able to financially maintain her home without her roommate.  Resources were given to the patient for [employer], [employer] and DSS for determine eligibility for emergency funds.    patient is requesting to be admitted inpatient at this time.  Psychiatry is agreeable.  Patient has been accepted to [facility] and will be transported via [employer].   Disposition:  Inpatient referral made [facility], [facility], [place])  Nonnie Done, [place] [phone]  [place] (5N 1-8) Clinical Social Worker

## 2014-09-30 NOTE — Progress Notes (Signed)
FMTS Attending Daily Note:  ** Late note:  Examined at 9:30 AM  S:  Patient doing well today.  Mentions palpitations which started this AM.  Has not received AM meds yet -- is in middle of medication switch.  Previously given 6 AM and 6 PM, moved today to 10 AM/10PM.  Did not receive any medications on Saturday as spent whole day in ED.  Exam: Gen:  Awake and alert, NAD Heart:  Tachy with regular pulse to 120s manually.   Lungs:  Clear without wheezing Abd: benign Psych:  Pleasant, conversant.    Imp/Plan: 1.  Asthma: - currently doing well.  No further symptoms. - Meds are PRN.  2.  SVT: - recurrence this AM - resolved s/p med administration  3.  Psych: - awaiting placement for inpatient psych services  4.  Dispo: - pending #4 above.   - medical stable for DC   Alveda Reasons, MD 09/30/2014 2:35 PM

## 2014-09-30 NOTE — Clinical Social Work Psych Note (Addendum)
12:06pm- Psych CSW received a call from [place] at [facility] to report patient has been accepted by [place] MD.  Bed assignment pending.  Patient is voluntary and will be transported via [place].    12:04pm- Patient is currently under review at [facility], [facility] and [place].  Nonnie Done, [place] [phone]  Psychiatric & Orthopedics (5N 1-8) Clinical Social Worker

## 2014-09-30 NOTE — Clinical Social Work Psych Note (Signed)
Patient to discharge to: [facility] Transportation: Betsy Pries [phone] (RN to arrange transportation once [facility] calls with bed assignment [phone]). Packet on chart Patient is voluntary and has signed the voluntary form (placed in packet).  Nonnie Done, [place] [phone]  Psychiatric & Orthopedics (5N 1-8) Clinical Social Worker

## 2014-09-30 NOTE — Discharge Instructions (Signed)
You are going to be discharged to [facility]  Please schedule a follow up appointment with Family Medicine at [phone]

## 2014-10-01 ENCOUNTER — Inpatient Hospital Stay
Admit: 2014-10-01 | Disposition: A | Discharge status: Home / Self Care | Payer: Self-pay | Attending: Psychiatry | Admitting: Psychiatry

## 2014-10-01 LAB — COMPREHENSIVE METABOLIC PANEL
ALK PHOS: 66 U/L
ANION GAP: 10 (ref 7–16)
Albumin: 3.3 g/dL — ABNORMAL LOW
BILIRUBIN TOTAL: 0.2 mg/dL — AB
BUN: 18 mg/dL
CHLORIDE: 99 mmol/L — AB
CREATININE: 0.85 mg/dL
Calcium, Total: 8.5 mg/dL — ABNORMAL LOW
Co2: 26 mmol/L
EGFR (African American): >60
EGFR (Non-African Amer.): >60
Glucose: 114 mg/dL — ABNORMAL HIGH
POTASSIUM: 3 mmol/L — AB
SGOT(AST): 19 U/L
SGPT (ALT): 15 U/L
Sodium: 135 mmol/L
Total Protein: 7.2 g/dL

## 2014-10-01 LAB — HEMOGLOBIN: HGB: 12.6 g/dL (ref 12.0–16.0)

## 2014-10-01 LAB — TSH: THYROID STIMULATING HORM: 2.672 u[IU]/mL

## 2014-10-02 LAB — URINALYSIS, COMPLETE
BILIRUBIN, UR: NEGATIVE
Bacteria: NONE SEEN
Blood: NEGATIVE
Glucose,UR: NEGATIVE mg/dL (ref 0–75)
KETONE: NEGATIVE
Leukocyte Esterase: NEGATIVE
Nitrite: NEGATIVE
PROTEIN: NEGATIVE
Ph: 6 (ref 4.5–8.0)
RBC, UR: NONE SEEN /HPF (ref 0–5)
Specific Gravity: 1.018 (ref 1.003–1.030)

## 2014-10-06 LAB — CREATININE, SERUM
CREATININE: 0.7 mg/dL
EGFR (Non-African Amer.): >60

## 2014-10-06 LAB — HEMOGLOBIN: HGB: 12.5 g/dL (ref 12.0–16.0)

## 2014-10-09 ENCOUNTER — Ambulatory Visit (INDEPENDENT_AMBULATORY_CARE_PROVIDER_SITE_OTHER): Payer: Medicare PPO | Admitting: Family Medicine

## 2014-10-09 ENCOUNTER — Telehealth: Payer: Self-pay | Admitting: Family Medicine

## 2014-10-09 ENCOUNTER — Encounter: Payer: Self-pay | Admitting: Family Medicine

## 2014-10-09 VITALS — BP 110/78 | HR 98 | Temp 97.9°F | Ht 65.0 in | Wt 265.6 lb

## 2014-10-09 DIAGNOSIS — J45902 Unspecified asthma with status asthmaticus: Secondary | ICD-10-CM

## 2014-10-09 DIAGNOSIS — F329 Major depressive disorder, single episode, unspecified: Secondary | ICD-10-CM

## 2014-10-09 DIAGNOSIS — M170 Bilateral primary osteoarthritis of knee: Secondary | ICD-10-CM | POA: Diagnosis not present

## 2014-10-09 DIAGNOSIS — F32A Depression, unspecified: Secondary | ICD-10-CM

## 2014-10-09 MED ORDER — TRAMADOL HCL 50 MG PO TABS: 50.0000 mg | ORAL_TABLET | Freq: Three times a day (TID) | ORAL | Status: DC | PRN

## 2014-10-09 NOTE — Assessment & Plan Note (Signed)
Patient feeling much better with her depression and anxiety on Abilify and Zoloft. She states she's feels back to her normal self. She is attending counseling at [facility]. Continue current regimen, follow-up as needed.

## 2014-10-09 NOTE — Assessment & Plan Note (Signed)
Patient with a history of osteoarthritis both knees, unable to do NSAIDs secondary to chronic Xarelto use. She has attempted Tylenol extra strength, without any relief. Prescribed tramadol, with 2 refills today patient will need a follow-up in 3 months for future refills.

## 2014-10-09 NOTE — Telephone Encounter (Addendum)
[name] White is having transportation issues and cannot keep appt today.  Want to know if she can be seen on your SDA schedule for her hosp f/u and med review.  Please let me know and I will resched appt   DISREGARD THIS NOTE.  PATIENT WILL KEEP PRESENT APPT TODAY

## 2014-10-09 NOTE — Patient Instructions (Signed)
I'm glad to hear you're doing much better. Continue taking her asthma medications as directed. I'm glad to hear you are continuing with your Zoloft and Abilify, and attending meetings at [facility] to help with your depression. I will prescribe you tramadol for your osteoarthritis of your knee, you could also make an appointment to have a knee injection, or be seen again at [facility] office for further evaluation.

## 2014-10-09 NOTE — Telephone Encounter (Signed)
You can place her on my SDA schedule, please inform her. We will only be able to cover hospital f/u and med review. No other issues. Thanks.

## 2014-10-09 NOTE — Progress Notes (Signed)
   Subjective:    Patient ID: [name] White, female    DOB: [birth year], 59 y.o.   MRN: [id number]  HPI  Asthma/hospital follow-up: She presents for hospital follow-up on her asthma exacerbation. Patient is now taking Symbicort and albuterol as needed. Her asthma has been under control since leaving the hospital with no issues. Not needing her rescue inhaler. Patient has a SCAT forms with her today to fill out for her persistent asthma/disability and need for transportation. Patient is unable to use the bus line secondary to the bus line being over half a mile away, and she is unable to walk that far with her asthma.  Depression, suicidality ideations: While admitted in the hospital patient was noted to have suicidal ideations. She has stopped her Cymbalta, and states that she will never stop her medications again. She has been feeling well since her hospital discharge. She is now taking Zoloft and Abilify, without any side effects. She reports her appointment for [facility] is next Wednesday, but she feels like she is doing great. No current SI or HI.   Osteoarthritis: Patient states that her bilateral knee osteoarthritis has been acting up more. She is unable to take NSAIDs, secondary to long-term Xarelto use. She has been trying to take extra strength Tylenol daily, but this is not helping her at all. She already sees orthopedics for occasional steroid shots in her knees, but states that she would like to try to take something when the pain is bad, especially just during the night. Tramadol is on her allergy list, but she states that she has been able to take that in the past and has done well.  Former smoker Past Medical History  Diagnosis Date  . Asthma   . Hypertension   . GERD (gastroesophageal reflux disease)   . Anxiety   . Depression   . Family history of anesthesia complication     "daughter; causes her to pass out afterwards"  . Pulmonary embolism 12/28/2013    "2 clots in each lung"    . Hyperthyroidism   . Migraine     "monthly" (12/28/2013)  . Osteoarthritis     "both knees; back of my neck; right pelvic bone" (12/28/2013)  . Chronic lower back pain    Allergies  Allergen Reactions  . Caffeine Other (See Comments)    Migraine  . Vicodin [Hydrocodone-Acetaminophen] Nausea And Vomiting and Other (See Comments)    Headache   . Ciprofloxacin Hives  . Erythromycin Hives  . Lisinopril Cough  . Oxycodone Nausea And Vomiting    headache  . Sulfamethoxazole-Trimethoprim Rash    Review of Systems Per history of present illness    Objective:   Physical Exam BP 110/78 mmHg  Pulse 98  Temp(Src) 97.9 F (36.6 C)  Ht 5\' 5"  (1.651 m)  Wt 265 lb 9.6 oz (120.475 kg)  BMI 44.20 kg/m2 Gen: NAD. Nontoxic in appearance, obese African-American female. CV: RRR  Chest: CTAB, no wheeze or crackles. Mildly diminished breath sounds. Ext: No erythema. No edema. No effusions. Patellar Grinding on exam. Decreased extension of bilateral legs. Neurovascular intact distally  Psych: Normal affect, dressed, demeanor and speech. Appears happy and well today. No SI or HI.         Assessment & Plan:

## 2014-10-09 NOTE — Assessment & Plan Note (Signed)
Patient states she is doing well since being discharged from the hospital, controlled on her medications only using her albuterol occasionally when needed.  SCAT forms completed today for transportation.

## 2014-10-26 NOTE — H&P (Signed)
PATIENT NAME:  [name] White, [name] White MR#:  [id number] DATE OF BIRTH:  [birth year]  DATE OF ADMISSION:  10/01/2014  IDENTIFYING INFORMATION: The patient is a 59 year old African American female divorced, from [place], [place] who currently receives disability for asthma.   CHIEF COMPLAINT: "For the last couple of weeks I was having severe anxiety, suicidal ideations, felt disorganized, was having crying spells and couldn't get it together."   HISTORY OF PRESENT ILLNESS:  [name] presented to [facility] in [facility] Emergency Department on April 2 due to asthma exacerbation and suicidal ideation. The patient was hospitalized on the medical floor for stabilization. During her stay, she voiced having suicidal ideation and worsening depression and, therefore, she was referred to our facility for further treatment and stabilization. The patient states that she has a history of depression and has been hospitalized at least twice before. Last November, the patient was trying to change her lifestyle and to diet and exercise. She was able to lose 15 pounds. She thought that she was doing much better and decided to stop her psychiatric medications, which included Cymbalta and Abilify. The patient also stopped going to her outpatient provider [facility] in [place]). The patient reports having recent severe stressors, which have caused her to fall into a depressive episode again.  She has been feeling depressed for several months; however, over the last several weeks the depression worsened, she was having severe anxiety, suicidal ideation, so confused with crying spells, did not sleep for several days, and was not eating.   The patient states that what initiated her asthma exacerbation was a panic attack.   Patient had to be transported by ambulance to the Emergency Department in [place]. She explains that her daughter for the second time has been diagnosed with cancer. She said that 2 years ago she was  diagnosed with thyroid cancer and receiving treatment for it but just recently she was diagnosed with breast cancer stage 2, and she is worrying about losing her. She tells me in addition, she lost her son in 2011 due to a cardiac arrest during a dialysis treatment. The patient cannot tolerate the idea of losing her only living child. She also tells me that she is having some financial issues as she has been living with her roommate that will be moving out of  the apartment at the beginning of next month, as her roommate found a job in [place].   The patient cannot afford the rent on her own and is forced now to find a new apartment which she has not been able to do as she could not focus or concentrate on it. During her stay at [facility] in [place], she was started on Cymbalta 1st and then Lexapro.   Both of them were discontinued as they had interactions with  one of her medications (the flecainide, Tambocor). Therefore, she was started on Zoloft 25 mg, Abilify 5 mg, and also he received Ativan q. 4 hours, and Ambien at bedtime. The patient reports improvement with that regimen. She feels her anxiety has improved. She also feels safer in the hospital. As far as substance abuse, the patient denies any  past or current history of substance abuse or alcohol.  She stopped smoking in 2001.   PAST PSYCHIATRIC HISTORY: The patient reports being hospitalized in 2011 in  behavioral health in [place] for depression after a suicidal attempt where she overdosed on sleeping medication. She then was hospitalized also in 2015 at another facility in [facility] for depression. She  follows up with [facility] in [place], but stopped going there in November.   She was prescribed by them with Cymbalta and Abilify and was doing well up until she stopped the medications back in November.   She had 1 suicidal attempt in 09/30/2009 when she overdosed on  sleeping medications.   PAST MEDICAL HISTORY: Positive for obesity, asthma, sustained  supraventricular tachycardia, pulmonary embolism, vocal cord dysfunction, hypothyroidism, overactive bladder, osteoarthritis, restless leg syndrome, hypertension, allergic rhinitis, and GERD.   HER CURRENT MEDICATIONS INCLUDE PER DISCHARGE SUMMARY:  Albuterol inhaler 2 puffs q. 6 hours as needed for shortness of breath, albuterol nebulizer solution 0.083% take 3 mL q. 6 hours as needed for shortness of breath. She is on Optivar 0.05% ophthalmic solution that she uses as needed twice a day for irritated eyes. She is on Symbicort 160/4.5 mg 2 puffs b.i.d., she is on  Dexilant 60 mg every morning, Voltaren gel 1% applied to knees twice a day as needed for pain. She is on Tambocor flecainide 100 mg every 12 hours, hydrochlorothiazide 25 mg every morning, metoprolol  25 mg twice a day, Xarelto 20 mg at supper for PE, and olopatadine 0.6%. solution 1 puff into nose at bedtime as needed for allergies.   FAMILY HISTORY: Positive for her mother suffering from schizophrenia and depression and her son been diagnosed with bipolar disorder.   SOCIAL HISTORY: The patient is divorced. She works as a Oceanographer. She has been receiving disability since 09-30-2004 for asthma.  The patient has 2 children;  her son died in 09/30/09 of cardiac arrest during dialysis and she has a daughter that has been  recently diagnosed with breast cancer.  The patient lives with her roommate in [place], [place]. She told me that her roommate will be moving out at the beginning of next month.  The patient is now forced to move out as she cannot afford to rent on her own.  The patient denies any history of legal problems in the past or present.   ALLERGIES: THE PATIENT REPORTS BEING ALLERGIC TO ERYTHROMYCIN, SEPTRA, CIPRO, VICODIN, OXYCODONE, AND LISINOPRIL.   REVIEW OF SYSTEMS: The patient  complains of fatigue. The rest of the 10 review of systems is negative.   PHYSICAL EXAMINATION:   Was performed at  [facility] in [place]  and was within the normal limits.  VITAL SIGNS:  Today blood pressure 137/83 respirations 18, pulse 64, temperature 36.6 or 98 degrees.  GENERAL: The patient is a 59 year old obese African American female who appears her stated age, displays fair grooming and hygiene, in no acute distress.  MUSCULOSKELETAL: She has normal gait, normal muscular tone, and no evidence of involuntary movements.  CARDIAC:  Per [place], cardiac tachycardic with a 2/6 systolic murmur.  RESPIRATORY: Normal effort, no wheezing.  ABDOMEN: Soft, nontender, nondistended.  No hepatic or splenomegaly.  Bowel sounds are present.  EXTREMITIES: No cyanosis, or edema.  SKIN: Warm and dry, no rashes noted.  NEUROLOGICAL:   Alert and oriented. No focal deficits.   MENTAL STATUS EXAMINATION: The patient is a 60 year old African American female, obese, who appears her stated age. She had fair grooming and hygiene. She had some dysphonia and she has a history of having vocal cord problems. She is wearing street clothes and reading glasses.   Behavior: She was calm, pleasant, and cooperative. Eye contact within the normal range. Speech had regular tone, volume, and rate. Thought process is linear and goal directed. Thought content is negative  for suicidality or homicidality. Perception negative for psychosis. Mood dysphoric and anxious. Affect is congruent, reactive, appropriate. Insight and judgment are fair.   COGNITIVE EXAMINATION:  She is alert and oriented in person, place, time, and situation. Attention and concentration is  grossly intact; however, it was not formally tested. Fund of knowledge appears to be average for her level of education.   LABORATORY RESULTS: Per outside facility, WBC was 12.6, hemoglobin 13.6, platelet count 295,000, sodium 140, potassium 2.6, BUN 12, creatinine 1.02, glucose 146, calcium 9.3.   ASSESSMENT: The patient is a 59 year old African American female with history of major depressive disorder and  anxiety who stopped her medications back in November 2015.   She had a relapse into  depression. She is currently facing serious psychosocial stressors, financial problems, and issue related to her  daughter's health. The patient became suicidal and started having panic attacks.  Therefore, she was transferred to our facility for further treatment.   PLAN: For major depressive disorder, the patient will be continued on sertraline.  I will increase the dose from 25 mg to 50 mg p.o. daily.  For antidepressant augmentation, I will continue Abilify 5 mg p.o. q.a.m. For insomnia, I will start the patient on trazodone 100 mg p.o. at bedtime. For anxiety, the patient will be continued on Ativan; however, I will reduce the dose to 0.5 mg p.o. q. 8 hours p.r.n. for anxiety. For asthma, the patient will be continued on her albuterol inhaler p.r.n. and Symbicort daily. For GERD,  she will be continued on a PPI, we do not have Dexilant in the hospital, therefore, I will start, pantoprazole 40 mg q.a.m.  For supraventricular tachycardia, she will continue on flecainide, Tambocor, 100 mg every 12 hours.   For hypertension, she will be continued on hydrochlorothiazide 25 mg p.o. daily, and metoprolol 25 mg p.o. b.i.d. For history of PE, she will be continued Xarelto 20 mg p.o. daily with supper.  For seasonal allergies, she will be continued on  olopatadine solution 0.6%, 2 puff at bedtime p.r.n.   ADMISSION STATUS: The patient will be continued on voluntary status.   LABORATORY:  I will order comprehensive metabolic panel, a TSH, and a urinalysis.     ____________________________ Hildred Priest, MD ahg:tr D: 10/01/2014 10:51:56 ET T: 10/01/2014 13:07:23 ET JOB#: [id number]  cc: Hildred Priest, MD, <Dictator> Rhodia Albright MD ELECTRONICALLY SIGNED 10/06/2014 15:13

## 2014-11-03 DIAGNOSIS — I482 Chronic atrial fibrillation, unspecified: Secondary | ICD-10-CM

## 2014-11-03 DIAGNOSIS — E876 Hypokalemia: Secondary | ICD-10-CM | POA: Insufficient documentation

## 2014-11-03 DIAGNOSIS — Z7901 Long term (current) use of anticoagulants: Secondary | ICD-10-CM | POA: Insufficient documentation

## 2014-11-03 DIAGNOSIS — I480 Paroxysmal atrial fibrillation: Secondary | ICD-10-CM | POA: Insufficient documentation

## 2014-11-03 DIAGNOSIS — M6282 Rhabdomyolysis: Secondary | ICD-10-CM | POA: Insufficient documentation

## 2014-11-03 DIAGNOSIS — Z86711 Personal history of pulmonary embolism: Secondary | ICD-10-CM

## 2014-11-03 DIAGNOSIS — F329 Major depressive disorder, single episode, unspecified: Secondary | ICD-10-CM | POA: Insufficient documentation

## 2014-11-03 HISTORY — DX: Personal history of pulmonary embolism: Z86.711

## 2014-11-03 HISTORY — DX: Rhabdomyolysis: M62.82

## 2014-11-05 DIAGNOSIS — Z8679 Personal history of other diseases of the circulatory system: Secondary | ICD-10-CM | POA: Insufficient documentation

## 2014-11-12 DIAGNOSIS — J45909 Unspecified asthma, uncomplicated: Secondary | ICD-10-CM | POA: Insufficient documentation

## 2015-03-22 ENCOUNTER — Emergency Department (HOSPITAL_COMMUNITY): Payer: Medicare PPO

## 2015-03-22 ENCOUNTER — Encounter (HOSPITAL_COMMUNITY): Payer: Self-pay | Admitting: *Deleted

## 2015-03-22 ENCOUNTER — Observation Stay (HOSPITAL_COMMUNITY)
Admission: EM | Admit: 2015-03-22 | Discharge: 2015-03-23 | Disposition: A | Discharge status: Home / Self Care | Payer: Medicare PPO | Attending: Family Medicine | Admitting: Family Medicine

## 2015-03-22 DIAGNOSIS — Z87891 Personal history of nicotine dependence: Secondary | ICD-10-CM | POA: Insufficient documentation

## 2015-03-22 DIAGNOSIS — I208 Other forms of angina pectoris: Secondary | ICD-10-CM | POA: Diagnosis present

## 2015-03-22 DIAGNOSIS — J45909 Unspecified asthma, uncomplicated: Secondary | ICD-10-CM | POA: Diagnosis present

## 2015-03-22 DIAGNOSIS — I517 Cardiomegaly: Secondary | ICD-10-CM | POA: Insufficient documentation

## 2015-03-22 DIAGNOSIS — N3281 Overactive bladder: Secondary | ICD-10-CM | POA: Insufficient documentation

## 2015-03-22 DIAGNOSIS — F329 Major depressive disorder, single episode, unspecified: Secondary | ICD-10-CM | POA: Insufficient documentation

## 2015-03-22 DIAGNOSIS — E039 Hypothyroidism, unspecified: Secondary | ICD-10-CM | POA: Diagnosis not present

## 2015-03-22 DIAGNOSIS — Z882 Allergy status to sulfonamides status: Secondary | ICD-10-CM | POA: Insufficient documentation

## 2015-03-22 DIAGNOSIS — J45901 Unspecified asthma with (acute) exacerbation: Secondary | ICD-10-CM | POA: Diagnosis not present

## 2015-03-22 DIAGNOSIS — I2699 Other pulmonary embolism without acute cor pulmonale: Secondary | ICD-10-CM | POA: Diagnosis present

## 2015-03-22 DIAGNOSIS — R079 Chest pain, unspecified: Secondary | ICD-10-CM | POA: Diagnosis present

## 2015-03-22 DIAGNOSIS — M170 Bilateral primary osteoarthritis of knee: Secondary | ICD-10-CM | POA: Diagnosis not present

## 2015-03-22 DIAGNOSIS — Z7901 Long term (current) use of anticoagulants: Secondary | ICD-10-CM | POA: Insufficient documentation

## 2015-03-22 DIAGNOSIS — R000 Tachycardia, unspecified: Secondary | ICD-10-CM | POA: Diagnosis not present

## 2015-03-22 DIAGNOSIS — Z888 Allergy status to other drugs, medicaments and biological substances status: Secondary | ICD-10-CM | POA: Diagnosis not present

## 2015-03-22 DIAGNOSIS — F419 Anxiety disorder, unspecified: Secondary | ICD-10-CM | POA: Insufficient documentation

## 2015-03-22 DIAGNOSIS — M545 Low back pain: Secondary | ICD-10-CM | POA: Diagnosis not present

## 2015-03-22 DIAGNOSIS — K219 Gastro-esophageal reflux disease without esophagitis: Secondary | ICD-10-CM | POA: Insufficient documentation

## 2015-03-22 DIAGNOSIS — Z86711 Personal history of pulmonary embolism: Secondary | ICD-10-CM | POA: Diagnosis not present

## 2015-03-22 DIAGNOSIS — E876 Hypokalemia: Secondary | ICD-10-CM | POA: Insufficient documentation

## 2015-03-22 DIAGNOSIS — F32A Depression, unspecified: Secondary | ICD-10-CM | POA: Diagnosis present

## 2015-03-22 DIAGNOSIS — Z6841 Body Mass Index (BMI) 40.0 and over, adult: Secondary | ICD-10-CM | POA: Diagnosis not present

## 2015-03-22 DIAGNOSIS — M130 Polyarthritis, unspecified: Secondary | ICD-10-CM | POA: Diagnosis not present

## 2015-03-22 DIAGNOSIS — G8929 Other chronic pain: Secondary | ICD-10-CM | POA: Insufficient documentation

## 2015-03-22 DIAGNOSIS — I10 Essential (primary) hypertension: Secondary | ICD-10-CM | POA: Diagnosis not present

## 2015-03-22 DIAGNOSIS — I4892 Unspecified atrial flutter: Secondary | ICD-10-CM | POA: Diagnosis not present

## 2015-03-22 DIAGNOSIS — I440 Atrioventricular block, first degree: Secondary | ICD-10-CM | POA: Insufficient documentation

## 2015-03-22 DIAGNOSIS — I471 Supraventricular tachycardia: Secondary | ICD-10-CM | POA: Diagnosis not present

## 2015-03-22 DIAGNOSIS — E669 Obesity, unspecified: Secondary | ICD-10-CM | POA: Insufficient documentation

## 2015-03-22 DIAGNOSIS — Z881 Allergy status to other antibiotic agents status: Secondary | ICD-10-CM | POA: Insufficient documentation

## 2015-03-22 DIAGNOSIS — I480 Paroxysmal atrial fibrillation: Secondary | ICD-10-CM | POA: Diagnosis not present

## 2015-03-22 DIAGNOSIS — R0789 Other chest pain: Secondary | ICD-10-CM | POA: Diagnosis not present

## 2015-03-22 HISTORY — DX: Paroxysmal atrial fibrillation: I48.0

## 2015-03-22 LAB — BASIC METABOLIC PANEL
ANION GAP: 9 (ref 5–15)
BUN: 10 mg/dL (ref 6–20)
CO2: 23 mmol/L (ref 22–32)
Calcium: 9.2 mg/dL (ref 8.9–10.3)
Chloride: 107 mmol/L (ref 101–111)
Creatinine, Ser: 0.71 mg/dL (ref 0.44–1.00)
GFR calc non Af Amer: >60 mL/min (ref >60)
Glucose, Bld: 109 mg/dL — ABNORMAL HIGH (ref 65–99)
Potassium: 3.6 mmol/L (ref 3.5–5.1)
SODIUM: 139 mmol/L (ref 135–145)

## 2015-03-22 LAB — CBC
HEMATOCRIT: 39.4 % (ref 36.0–46.0)
Hemoglobin: 12.8 g/dL (ref 12.0–15.0)
MCH: 28.7 pg (ref 26.0–34.0)
MCHC: 32.5 g/dL (ref 30.0–36.0)
MCV: 88.3 fL (ref 78.0–100.0)
Platelets: 376 10*3/uL (ref 150–400)
RBC: 4.46 MIL/uL (ref 3.87–5.11)
RDW: 16.8 % — AB (ref 11.5–15.5)
WBC: 8.4 10*3/uL (ref 4.0–10.5)

## 2015-03-22 LAB — TSH: TSH: 0.84 u[IU]/mL (ref 0.350–4.500)

## 2015-03-22 LAB — I-STAT TROPONIN, ED: Troponin i, poc: 0.01 ng/mL (ref 0.00–0.08)

## 2015-03-22 LAB — TROPONIN I: Troponin I: <0.03 ng/mL (ref <0.031)

## 2015-03-22 LAB — MRSA PCR SCREENING: MRSA by PCR: NEGATIVE

## 2015-03-22 MED ORDER — BUDESONIDE-FORMOTEROL FUMARATE 160-4.5 MCG/ACT IN AERO
2.0000 | INHALATION_SPRAY | Freq: Two times a day (BID) | RESPIRATORY_TRACT | Status: DC
  Administered 2015-03-22 – 2015-03-23 (×2): 2 via RESPIRATORY_TRACT
  Filled 2015-03-22: qty 6

## 2015-03-22 MED ORDER — FLECAINIDE ACETATE 100 MG PO TABS
100.0000 mg | ORAL_TABLET | Freq: Two times a day (BID) | ORAL | Status: DC
  Administered 2015-03-22 – 2015-03-23 (×2): 100 mg via ORAL
  Filled 2015-03-22 (×3): qty 1

## 2015-03-22 MED ORDER — NITROGLYCERIN 0.4 MG SL SUBL: 0.4000 mg | SUBLINGUAL_TABLET | SUBLINGUAL | Status: DC | PRN

## 2015-03-22 MED ORDER — NITROGLYCERIN 0.4 MG SL SUBL
0.4000 mg | SUBLINGUAL_TABLET | SUBLINGUAL | Status: AC | PRN
  Administered 2015-03-22 (×3): 0.4 mg via SUBLINGUAL
  Filled 2015-03-22: qty 1

## 2015-03-22 MED ORDER — IOHEXOL 350 MG/ML SOLN
100.0000 mL | Freq: Once | INTRAVENOUS | Status: AC | PRN
  Administered 2015-03-22: 100 mL via INTRAVENOUS

## 2015-03-22 MED ORDER — INFLUENZA VAC SPLIT QUAD 0.5 ML IM SUSY
0.5000 mL | PREFILLED_SYRINGE | INTRAMUSCULAR | Status: AC
  Administered 2015-03-23: 0.5 mL via INTRAMUSCULAR
  Filled 2015-03-22: qty 0.5

## 2015-03-22 MED ORDER — GI COCKTAIL ~~LOC~~: 30.0000 mL | Freq: Four times a day (QID) | ORAL | Status: DC | PRN

## 2015-03-22 MED ORDER — METOPROLOL TARTRATE 25 MG PO TABS
25.0000 mg | ORAL_TABLET | Freq: Two times a day (BID) | ORAL | Status: DC
  Administered 2015-03-22: 25 mg via ORAL
  Filled 2015-03-22: qty 1

## 2015-03-22 MED ORDER — ARIPIPRAZOLE 5 MG PO TABS
5.0000 mg | ORAL_TABLET | Freq: Every day | ORAL | Status: DC
  Administered 2015-03-22: 5 mg via ORAL
  Filled 2015-03-22 (×2): qty 1

## 2015-03-22 MED ORDER — SERTRALINE HCL 50 MG PO TABS
25.0000 mg | ORAL_TABLET | Freq: Every day | ORAL | Status: DC
  Administered 2015-03-22 – 2015-03-23 (×2): 25 mg via ORAL
  Filled 2015-03-22 (×2): qty 1

## 2015-03-22 MED ORDER — ASPIRIN EC 81 MG PO TBEC
81.0000 mg | DELAYED_RELEASE_TABLET | Freq: Every day | ORAL | Status: DC
  Administered 2015-03-23: 81 mg via ORAL
  Filled 2015-03-22: qty 1

## 2015-03-22 MED ORDER — SENNOSIDES-DOCUSATE SODIUM 8.6-50 MG PO TABS: 1.0000 | ORAL_TABLET | Freq: Every evening | ORAL | Status: DC | PRN

## 2015-03-22 MED ORDER — RIVAROXABAN 20 MG PO TABS
20.0000 mg | ORAL_TABLET | Freq: Every day | ORAL | Status: DC
  Administered 2015-03-22 – 2015-03-23 (×2): 20 mg via ORAL
  Filled 2015-03-22 (×2): qty 1

## 2015-03-22 MED ORDER — ALBUTEROL SULFATE (2.5 MG/3ML) 0.083% IN NEBU
2.5000 mg | INHALATION_SOLUTION | Freq: Four times a day (QID) | RESPIRATORY_TRACT | Status: DC | PRN
Start: 2015-03-22 — End: 2015-03-23

## 2015-03-22 MED ORDER — SODIUM CHLORIDE 0.9 % IV BOLUS (SEPSIS)
1000.0000 mL | Freq: Once | INTRAVENOUS | Status: AC
  Administered 2015-03-22: 1000 mL via INTRAVENOUS

## 2015-03-22 MED ORDER — TRAMADOL HCL 50 MG PO TABS: 50.0000 mg | ORAL_TABLET | Freq: Three times a day (TID) | ORAL | Status: DC | PRN

## 2015-03-22 MED ORDER — ACETAMINOPHEN 325 MG PO TABS: 650.0000 mg | ORAL_TABLET | ORAL | Status: DC | PRN

## 2015-03-22 MED ORDER — TRAZODONE HCL 100 MG PO TABS
100.0000 mg | ORAL_TABLET | Freq: Every evening | ORAL | Status: DC | PRN
  Administered 2015-03-22: 100 mg via ORAL
  Filled 2015-03-22: qty 1

## 2015-03-22 MED ORDER — ONDANSETRON HCL 4 MG/2ML IJ SOLN: 4.0000 mg | Freq: Four times a day (QID) | INTRAMUSCULAR | Status: DC | PRN

## 2015-03-22 MED ORDER — PANTOPRAZOLE SODIUM 40 MG PO TBEC
40.0000 mg | DELAYED_RELEASE_TABLET | Freq: Every day | ORAL | Status: DC
  Administered 2015-03-22 – 2015-03-23 (×2): 40 mg via ORAL
  Filled 2015-03-22 (×2): qty 1

## 2015-03-22 MED ORDER — LORAZEPAM 2 MG/ML IJ SOLN
1.0000 mg | Freq: Once | INTRAMUSCULAR | Status: AC
  Administered 2015-03-22: 1 mg via INTRAVENOUS
  Filled 2015-03-22: qty 1

## 2015-03-22 MED ORDER — PANTOPRAZOLE SODIUM 40 MG PO TBEC: 40.0000 mg | DELAYED_RELEASE_TABLET | Freq: Every day | ORAL | Status: DC

## 2015-03-22 NOTE — H&P (Signed)
[facility] Admission History and Physical Service Pager: [phone]  Patient name: [name] White Medical record number: [id number] Date of birth: [birth year] Age: 59 y.o. Gender: female  Primary Care Provider: Howard Pouch, DO Consultants: Cardiology Code Status: FULL (discussed on admission)  Chief Complaint: chest tightness  Assessment and Plan: [name] White is a 60 y.o. female presenting with central chest tightness . PMH is significant for HTN, Paroxysmal A-fib, Asthma, GERD, hx PE, Depression/Anxiety, OA.    Atypical Chest Pain: Chest tightness at rest associated with some wheezing, lightheadedness/weakness. Wheezing improved with albuterol neb by EMS. Chest tightness improved with nitro in ED but has not fully resolved. Soreness to palpation of sternal area. No prior history of MI or prior cath. Likely asthma related vs musculoskeletal vs MI (less likely as no ischemic changes on EKG and trop neg) vs PE (neg CTA). EKG shows Afib without signs of ischemia. I-stat troponin 0.01. CTA neg for PE. VSS.  Breathing well on RA.   - Admit to observation under Dr McDiarmid for ACS rule out - on telemetry - Vitals per floor protocol.  Monitor O2. - EKG AM - trend trop q 6 x 3 - Risk stratification labs ordered: Lipid, A1c, TSH - Repeat BMET in am  Paroxysmal Afib: EKG on admission shows Afib. Rate controlled with HR in 70s-80s. On xarelto 20mg  daily, Metoprolol 25 BID, Flecainide 100mg  BID. [facility] (Dr. Lovena Le).  ECHO 2015: LVEF 50-65%, mild RAE, borderilne pulm HTN, G1DD. CHADSVasc score: 3. - continue home meds: Xarelto 20mg  daily,Flecainide 100mg  BID, Metoprolol 25BID  History of PE: On Xarelto. CTA negative. Wells score 1.5. - continue Xarelto  Depression/Anxiety:  - Zoloft 25mg  daily  - Trazodone 100mg  for sleep  GERD:  - Protonix 40mg  daily - GI cocktail PRN  FEN/GI: Cardiac diet Prophylaxis: Xarelto  Disposition:  admitted to telemetry for observation and r/o ACS  History of Present Illness:  [name] White is a 59 y.o. female presenting with atypical angina  Patient notes of tightness in her chest that began while sitting at church today @930a . Shortly after her chest tightness started, she began to wheeze.  EMS arrived and gave a breathing trx which resolved wheezing but tightness remained.  Patient notes being weak, lightheaded, and trembling during this epidose. She notes symptoms persisted until she got Nitro tabs. She reports that she is able to breath deeper now. No diaphoresis, no radiation of chest discomfort. She has had similar symptoms in the past with asthma exacerbation and afib.  Last episode ~1 month ago which lasted about 30 minutes and resolved with rest. This episode seemed stronger and did not resolve. Patient endorses palpitations, lightheadedness.  No hx of MI or heart cath in past.  Does note a history of PE.  Patient takes albuterol and symbicort at home.  Compliant with home meds for a-fib and PE.  Sees Cristopher Peru, cardiologist.  Has appt 10/4.  In the ED, patient was given Nitroglycerin 0.4mg  x 3, Ativan 1mg  x 1, and 1L bolus x2.   Review Of Systems: Per HPI with the following additions: denies cough, rhinorrhea, constipation, diarrhea.  Otherwise 12 point review of systems was performed and was unremarkable.  Patient Active Problem List   Diagnosis Date Noted  . Chest pain 03/22/2015  . Asthma with acute exacerbation   . Asthma with status asthmaticus in adult   . SOB (shortness of breath)   . Suicidal ideation 09/27/2014  . MDD (  major depressive disorder), recurrent severe, without psychosis 03/05/2014  . Narrow complex tachycardia 01/31/2014  . Sustained SVT 01/31/2014  . Pulmonary embolism 12/27/2013  . Vocal cord dysfunction 11/12/2013  . Lower extremity edema 09/18/2013  . Rotator cuff impingement syndrome of right shoulder 09/18/2013  . Mixed headache 01/06/2012   . Hypothyroidism 11/01/2011  . OVERACTIVE BLADDER 04/18/2008  . Osteoarthritis of both knees 04/18/2008  . POLYARTHRITIS 09/14/2007  . Obesity 08/24/2006  . Depression 08/24/2006  . RESTLESS LEGS SYNDROME 08/24/2006  . HYPERTENSION, BENIGN SYSTEMIC 08/24/2006  . RHINITIS, ALLERGIC 08/24/2006  . ASTHMA, PERSISTENT 08/24/2006  . GASTROESOPHAGEAL REFLUX, NO ESOPHAGITIS 08/24/2006   Past Medical History: Past Medical History  Diagnosis Date  . Asthma   . Hypertension   . GERD (gastroesophageal reflux disease)   . Anxiety   . Depression   . Family history of anesthesia complication     "daughter; causes her to pass out afterwards"  . Pulmonary embolism 12/28/2013    "2 clots in each lung"  . Hyperthyroidism   . Migraine     "monthly" (12/28/2013)  . Osteoarthritis     "both knees; back of my neck; right pelvic bone" (12/28/2013)  . Chronic lower back pain   . Paroxysmal a-fib    Past Surgical History: Past Surgical History  Procedure Laterality Date  . Abdominal hysterectomy    . Wisdom tooth extraction    . Thyroidectomy, partial Right 2005  . Nasal sinus surgery  2007  . Appendectomy    . Excisional hemorrhoidectomy    . Dilation and curettage of uterus    . Tubal ligation    . Breast cyst excision Right    Social History: Social History  Substance Use Topics  . Smoking status: Former Smoker -- 0.25 packs/day for 20 years    Types: Cigarettes    Quit date: 07/17/1999  . Smokeless tobacco: Never Used  . Alcohol Use: Yes     Comment: "drank some in my 30's"   Please also refer to relevant sections of EMR.  Family History: Family History  Problem Relation Age of Onset  . Osteoarthritis Mother   . Asthma Mother   . Heart failure Mother    Allergies and Medications: Allergies  Allergen Reactions  . Caffeine Other (See Comments)    Migraine  . Vicodin [Hydrocodone-Acetaminophen] Nausea And Vomiting and Other (See Comments)    Headache   . Ciprofloxacin Hives   . Erythromycin Hives  . Lisinopril Cough  . Oxycodone Nausea And Vomiting    headache  . Sulfamethoxazole-Trimethoprim Rash   Current Facility-Administered Medications on File Prior to Encounter  Medication Dose Route Frequency Provider Last Rate Last Dose  . diphenhydrAMINE (BENADRYL) capsule 50 mg  50 mg Oral Q6H PRN Boykin Nearing, MD   50 mg at 01/06/12 1458   Current Outpatient Prescriptions on File Prior to Encounter  Medication Sig Dispense Refill  . albuterol (PROVENTIL HFA;VENTOLIN HFA) 108 (90 BASE) MCG/ACT inhaler Inhale 2 puffs into the lungs every 6 (six) hours as needed. For shortness of breath    . albuterol (PROVENTIL) (2.5 MG/3ML) 0.083% nebulizer solution Take 3 mLs (2.5 mg total) by nebulization every 6 (six) hours as needed. For shortness of breath 75 mL 12  . ARIPiprazole (ABILIFY) 5 MG tablet Take 1 tablet (5 mg total) by mouth at bedtime. 60 tablet 3  . azelastine (OPTIVAR) 0.05 % ophthalmic solution Place 1 drop into both eyes 2 (two) times daily as needed. For dry/irritated eyes    .  budesonide-formoterol (SYMBICORT) 160-4.5 MCG/ACT inhaler Inhale 2 puffs into the lungs 2 (two) times daily.      Marland Kitchen dexlansoprazole (DEXILANT) 60 MG capsule Take 60 mg by mouth every morning.     . diclofenac sodium (VOLTAREN) 1 % GEL Apply 2 g topically 2 (two) times daily as needed (For pain or inflammation on knees.).     Marland Kitchen flecainide (TAMBOCOR) 100 MG tablet Take 1 tablet (100 mg total) by mouth every 12 (twelve) hours. 180 tablet 3  . hydrochlorothiazide (HYDRODIURIL) 25 MG tablet Take 1 tablet (25 mg total) by mouth every morning. 90 tablet 3  . metoprolol (LOPRESSOR) 50 MG tablet Take 0.5 tablets (25 mg total) by mouth 2 (two) times daily. 180 tablet 3  . Olopatadine HCl 0.6 % SOLN Place 1 puff into the nose at bedtime as needed (For seasonal allergies.).     Marland Kitchen senna-docusate (SENOKOT-S) 8.6-50 MG per tablet Take 1 tablet by mouth at bedtime as needed for mild constipation. 30  tablet 3  . sertraline (ZOLOFT) 25 MG tablet Take 1 tablet (25 mg total) by mouth daily. 60 tablet 3  . traMADol (ULTRAM) 50 MG tablet Take 1 tablet (50 mg total) by mouth every 8 (eight) hours as needed. (Patient taking differently: Take 50 mg by mouth every 8 (eight) hours as needed for moderate pain or severe pain. ) 30 tablet 2  . rivaroxaban (XARELTO) 20 MG TABS tablet Take 1 tablet (20 mg total) by mouth daily with supper. 30 tablet 11    Objective: BP 136/84 mmHg  Pulse 99  Temp(Src) 98.1 F (36.7 C) (Oral)  Resp 16  Ht 5\' 5"  (1.651 m)  Wt 250 lb (113.399 kg)  BMI 41.60 kg/m2  SpO2 99% Exam: GEN: awake, alert, NAD HEENT: Atraumatic, normocephalic, neck supple, EOMI, sclera clear  CV: irregularly irregular, rate normal, no murmurs, rubs, or gallops PULM: CTAB, normal effort ABD: Soft, nontender, nondistended, NABS, no organomegaly SKIN: No rash or cyanosis; warm and well-perfused EXTR: No lower extremity edema or calf tenderness; PT pulses intact bilaterally.  PSYCH: Mood and affect euthymic, normal rate and volume of speech NEURO: Awake, alert, no focal deficits grossly, normal speech  Labs and Imaging: CBC BMET   Recent Labs Lab 03/22/15 1054  WBC 8.4  HGB 12.8  HCT 39.4  PLT 376    Recent Labs Lab 03/22/15 1054  NA 139  K 3.6  CL 107  CO2 23  BUN 10  CREATININE 0.71  GLUCOSE 109*  CALCIUM 9.2     Dg Chest 2 View  03/22/2015   CLINICAL DATA:  Chest pain, shortness of breath, history of asthma and bilateral PEs  EXAM: CHEST  2 VIEW  COMPARISON:  09/27/2014  FINDINGS: Lungs are clear.  No pleural effusion or pneumothorax.  Mild cardiomegaly.  Surgical clips along the right superior mediastinum/neck.  Degenerative changes of the visualized thoracolumbar spine.  IMPRESSION: No evidence of acute cardiopulmonary disease.   Electronically Signed   By: Julian Hy M.D.   On: 03/22/2015 11:52   Ct Angio Chest Pe W/cm &/or Wo Cm  03/22/2015   CLINICAL DATA:   59 year old female with shortness breath and chest pain while attending church this morning. History of pulmonary embolism.  EXAM: CT ANGIOGRAPHY CHEST WITH CONTRAST  TECHNIQUE: Multidetector CT imaging of the chest was performed using the standard protocol during bolus administration of intravenous contrast. Multiplanar CT image reconstructions and MIPs were obtained to evaluate the vascular anatomy.  CONTRAST:  155mL OMNIPAQUE IOHEXOL 350 MG/ML SOLN  COMPARISON:  Chest CT 01/31/2014.  FINDINGS: Mediastinum/Lymph Nodes: There are no filling defects within the pulmonary arterial tree to suggest underlying pulmonary embolism. Heart size is normal. There is no significant pericardial fluid, thickening or pericardial calcification. No pathologically enlarged mediastinal or hilar lymph nodes. Esophagus is unremarkable in appearance. Status post right hemithyroidectomy. No axillary lymphadenopathy.  Lungs/Pleura: Areas of dependent atelectasis are noted in the lower lobes of the lungs bilaterally. No acute consolidative airspace disease. No pleural effusions. No suspicious appearing pulmonary nodules or masses.  Upper Abdomen: 1 cm low-attenuation lesion in segment 3 of the liver is incompletely characterized, but likely to represent a cyst.  Musculoskeletal/Soft Tissues: There are no aggressive appearing lytic or blastic lesions noted in the visualized portions of the skeleton.  Review of the MIP images confirms the above findings.  IMPRESSION: 1. No evidence pulmonary embolism. 2. No acute findings in the thorax to account for the patient's symptoms. 3. Additional incidental findings, as above.   Electronically Signed   By: Vinnie Langton M.D.   On: 03/22/2015 14:42   Smiley Houseman, MD 03/22/2015, 3:52 PM PGY-1, [place] Intern pager: [phone], text pages welcome  I have separately seen and examined the patient. I have discussed the findings and exam with Dr Dallas Schimke and agree with  the above note.  My changes/additions are outlined in BLUE.   Ashly M. Lajuana Ripple, DO PGY-2, [place]

## 2015-03-22 NOTE — ED Provider Notes (Signed)
CSN: [id number]     Arrival date & time 03/22/15  1044 History   First MD Initiated Contact with Patient 03/22/15 1045     Chief Complaint  Patient presents with  . Chest Pain  . Shortness of Breath     (Consider location/radiation/quality/duration/timing/severity/associated sxs/prior Treatment) HPI  59 year old female with a history of paroxysmal A. fib presents with chest tightness since around 9:30 this morning. She was in Sunday school and all of a sudden started having chest tightness and shortness of breath. When the fire department arrived they noted wheezing and gave her 5 mg albuterol. She does have a history of asthma and states that now her shortness of breath has resolved but her chest tightness is still present. EMS gave 324 mg aspirin and 2 nitroglycerin which did relieve her tightness temporarily but now it is back. She has experienced some lightheadedness. She has not had palpitations and is unsure if she is currently in A. fib. This feels different than her typical asthma from a chest tightness perspective. It feels like it is difficult to get in a full breath but does not particularly make the pain worse. Has not noticed any new or asymmetric leg swelling. Does have a history of a pulmonary embolism 1 year ago and thinks she may have had similar symptoms to this. She is currently on Xarelto. Patient has mild tremors in her upper extremities which she states started with the pain today but she does not feel particularly anxious.  Past Medical History  Diagnosis Date  . Asthma   . Hypertension   . GERD (gastroesophageal reflux disease)   . Anxiety   . Depression   . Family history of anesthesia complication     "daughter; causes her to pass out afterwards"  . Pulmonary embolism 12/28/2013    "2 clots in each lung"  . Hyperthyroidism   . Migraine     "monthly" (12/28/2013)  . Osteoarthritis     "both knees; back of my neck; right pelvic bone" (12/28/2013)  . Chronic lower back  pain   . Paroxysmal a-fib    Past Surgical History  Procedure Laterality Date  . Abdominal hysterectomy    . Wisdom tooth extraction    . Thyroidectomy, partial Right 2005  . Nasal sinus surgery  2007  . Appendectomy    . Excisional hemorrhoidectomy    . Dilation and curettage of uterus    . Tubal ligation    . Breast cyst excision Right    Family History  Problem Relation Age of Onset  . Osteoarthritis Mother   . Asthma Mother   . Heart failure Mother    Social History  Substance Use Topics  . Smoking status: Former Smoker -- 0.25 packs/day for 20 years    Types: Cigarettes    Quit date: 07/17/1999  . Smokeless tobacco: Never Used  . Alcohol Use: Yes     Comment: "drank some in my 30's"   OB History    No data available     Review of Systems  Constitutional: Negative for fever.  Respiratory: Positive for cough (since albuterol), chest tightness and shortness of breath.   Cardiovascular: Negative for leg swelling.  Gastrointestinal: Negative for vomiting.  Neurological: Positive for tremors.  Psychiatric/Behavioral: The patient is not nervous/anxious.   All other systems reviewed and are negative.     Allergies  Caffeine; Vicodin; Ciprofloxacin; Erythromycin; Lisinopril; Oxycodone; and Sulfamethoxazole-trimethoprim  Home Medications   Prior to Admission medications   Medication  Sig Start Date End Date Taking? Authorizing Provider  albuterol (PROVENTIL HFA;VENTOLIN HFA) 108 (90 BASE) MCG/ACT inhaler Inhale 2 puffs into the lungs every 6 (six) hours as needed. For shortness of breath    Historical Provider, MD  albuterol (PROVENTIL) (2.5 MG/3ML) 0.083% nebulizer solution Take 3 mLs (2.5 mg total) by nebulization every 6 (six) hours as needed. For shortness of breath 04/07/13   Hilton Sinclair, MD  ARIPiprazole (ABILIFY) 5 MG tablet Take 1 tablet (5 mg total) by mouth at bedtime. 09/30/14   Veatrice Bourbon, MD  azelastine (OPTIVAR) 0.05 % ophthalmic solution  Place 1 drop into both eyes 2 (two) times daily as needed. For dry/irritated eyes    Historical Provider, MD  budesonide-formoterol (SYMBICORT) 160-4.5 MCG/ACT inhaler Inhale 2 puffs into the lungs 2 (two) times daily.      Historical Provider, MD  dexlansoprazole (DEXILANT) 60 MG capsule Take 60 mg by mouth every morning.     Historical Provider, MD  diclofenac sodium (VOLTAREN) 1 % GEL Apply 2 g topically 2 (two) times daily as needed (For pain or inflammation on knees.).     Historical Provider, MD  flecainide (TAMBOCOR) 100 MG tablet Take 1 tablet (100 mg total) by mouth every 12 (twelve) hours. 04/24/14   Evans Lance, MD  hydrochlorothiazide (HYDRODIURIL) 25 MG tablet Take 1 tablet (25 mg total) by mouth every morning. 04/24/14   Evans Lance, MD  metoprolol (LOPRESSOR) 50 MG tablet Take 0.5 tablets (25 mg total) by mouth 2 (two) times daily. 08/19/14   Evans Lance, MD  Olopatadine HCl 0.6 % SOLN Place 1 puff into the nose at bedtime as needed (For seasonal allergies.).     Historical Provider, MD  rivaroxaban (XARELTO) 20 MG TABS tablet Take 1 tablet (20 mg total) by mouth daily with supper. 08/19/14 10/28/14  Evans Lance, MD  senna-docusate (SENOKOT-S) 8.6-50 MG per tablet Take 1 tablet by mouth at bedtime as needed for mild constipation. 09/30/14   Veatrice Bourbon, MD  sertraline (ZOLOFT) 25 MG tablet Take 1 tablet (25 mg total) by mouth daily. 09/30/14   Veatrice Bourbon, MD  traMADol (ULTRAM) 50 MG tablet Take 1 tablet (50 mg total) by mouth every 8 (eight) hours as needed. 10/09/14   Renee A Kuneff, DO   BP 133/109 mmHg  Pulse 95  Temp(Src) 98.1 F (36.7 C) (Oral)  Ht 5\' 5"  (1.651 m)  Wt 250 lb (113.399 kg)  BMI 41.60 kg/m2  SpO2 95% Physical Exam  Constitutional: She is oriented to person, place, and time. She appears well-developed and well-nourished.  HENT:  Head: Normocephalic and atraumatic.  Right Ear: External ear normal.  Left Ear: External ear normal.  Nose: Nose normal.   Eyes: Right eye exhibits no discharge. Left eye exhibits no discharge.  Cardiovascular: Normal rate and normal heart sounds.  An irregular rhythm present.  Pulmonary/Chest: Effort normal and breath sounds normal. No respiratory distress. She has no wheezes. She has no rales. She exhibits tenderness (mid sternal).  Abdominal: Soft. There is no tenderness.  Musculoskeletal: She exhibits no edema.  Neurological: She is alert and oriented to person, place, and time. She displays tremor (mild tremor noted in both hands).  Skin: Skin is warm and dry. She is not diaphoretic.  Nursing note and vitals reviewed.   ED Course  Procedures (including critical care time) Labs Review Labs Reviewed  BASIC METABOLIC PANEL - Abnormal; Notable for the following:  Glucose, Bld 109 (*)    All other components within normal limits  CBC - Abnormal; Notable for the following:    RDW 16.8 (*)    All other components within normal limits  I-STAT TROPOININ, ED    Imaging Review Dg Chest 2 View  03/22/2015   CLINICAL DATA:  Chest pain, shortness of breath, history of asthma and bilateral PEs  EXAM: CHEST  2 VIEW  COMPARISON:  09/27/2014  FINDINGS: Lungs are clear.  No pleural effusion or pneumothorax.  Mild cardiomegaly.  Surgical clips along the right superior mediastinum/neck.  Degenerative changes of the visualized thoracolumbar spine.  IMPRESSION: No evidence of acute cardiopulmonary disease.   Electronically Signed   By: Julian Hy M.D.   On: 03/22/2015 11:52   Ct Angio Chest Pe W/cm &/or Wo Cm  03/22/2015   CLINICAL DATA:  59 year old female with shortness breath and chest pain while attending church this morning. History of pulmonary embolism.  EXAM: CT ANGIOGRAPHY CHEST WITH CONTRAST  TECHNIQUE: Multidetector CT imaging of the chest was performed using the standard protocol during bolus administration of intravenous contrast. Multiplanar CT image reconstructions and MIPs were obtained to evaluate  the vascular anatomy.  CONTRAST:  129mL OMNIPAQUE IOHEXOL 350 MG/ML SOLN  COMPARISON:  Chest CT 01/31/2014.  FINDINGS: Mediastinum/Lymph Nodes: There are no filling defects within the pulmonary arterial tree to suggest underlying pulmonary embolism. Heart size is normal. There is no significant pericardial fluid, thickening or pericardial calcification. No pathologically enlarged mediastinal or hilar lymph nodes. Esophagus is unremarkable in appearance. Status post right hemithyroidectomy. No axillary lymphadenopathy.  Lungs/Pleura: Areas of dependent atelectasis are noted in the lower lobes of the lungs bilaterally. No acute consolidative airspace disease. No pleural effusions. No suspicious appearing pulmonary nodules or masses.  Upper Abdomen: 1 cm low-attenuation lesion in segment 3 of the liver is incompletely characterized, but likely to represent a cyst.  Musculoskeletal/Soft Tissues: There are no aggressive appearing lytic or blastic lesions noted in the visualized portions of the skeleton.  Review of the MIP images confirms the above findings.  IMPRESSION: 1. No evidence pulmonary embolism. 2. No acute findings in the thorax to account for the patient's symptoms. 3. Additional incidental findings, as above.   Electronically Signed   By: Vinnie Langton M.D.   On: 03/22/2015 14:42   I have personally reviewed and evaluated these images and lab results as part of my medical decision-making.   EKG Interpretation   Date/Time:  Sunday March 22 2015 10:48:35 EDT Ventricular Rate:  88 PR Interval:    QRS Duration: 91 QT Interval:  393 QTC Calculation: 475 R Axis:   -3 Text Interpretation:  Atrial fibrillation vs flutter Minimal ST  depression, anterolateral leads Confirmed by Eastyn Skalla  MD, [place] Jakubiak ([id number]) on  03/22/2015 10:52:16 AM      MDM   Final diagnoses:  Chest pain, unspecified chest pain type    Patient's pain is now resolved. Patient's chest pain is certainly atypical and given  her shortness of breath without wheezing workup for pulmonary embolism was obtained and is negative. EKG shows no acute ischemia but with her risk factors I feel that she would benefit for overnight observation on telemetry given her intermittent A. fib and serial troponins. Admit to family medicine.    Sherwood Gambler, MD 03/22/15 1520

## 2015-03-22 NOTE — ED Notes (Addendum)
Pt picked up by [facility] from church.  Pt began experiencing sternal chest tightness and then sob while sitting.   Fire noted wheezing and gave pt 5 mg albuterol, which relieved wheezing and sob.  However tightness was relieved only mildly by 2nd nitro.  324 asa given en-route.  HR sinus tach per EMS.  Hx of paroxysmal afib.

## 2015-03-22 NOTE — ED Notes (Signed)
Attempted report X1

## 2015-03-23 DIAGNOSIS — J45901 Unspecified asthma with (acute) exacerbation: Secondary | ICD-10-CM

## 2015-03-23 DIAGNOSIS — I480 Paroxysmal atrial fibrillation: Secondary | ICD-10-CM | POA: Diagnosis not present

## 2015-03-23 DIAGNOSIS — R079 Chest pain, unspecified: Secondary | ICD-10-CM

## 2015-03-23 DIAGNOSIS — I471 Supraventricular tachycardia: Secondary | ICD-10-CM | POA: Diagnosis not present

## 2015-03-23 DIAGNOSIS — I10 Essential (primary) hypertension: Secondary | ICD-10-CM

## 2015-03-23 DIAGNOSIS — R072 Precordial pain: Secondary | ICD-10-CM

## 2015-03-23 DIAGNOSIS — I208 Other forms of angina pectoris: Secondary | ICD-10-CM

## 2015-03-23 DIAGNOSIS — R0789 Other chest pain: Secondary | ICD-10-CM | POA: Diagnosis not present

## 2015-03-23 LAB — LIPID PANEL
Cholesterol: 171 mg/dL (ref 0–200)
HDL: 72 mg/dL (ref >40)
LDL CALC: 91 mg/dL (ref 0–99)
TRIGLYCERIDES: 40 mg/dL (ref <150)
Total CHOL/HDL Ratio: 2.4 RATIO
VLDL: 8 mg/dL (ref 0–40)

## 2015-03-23 LAB — BASIC METABOLIC PANEL
ANION GAP: 8 (ref 5–15)
BUN: 8 mg/dL (ref 6–20)
CHLORIDE: 107 mmol/L (ref 101–111)
CO2: 25 mmol/L (ref 22–32)
Calcium: 8.3 mg/dL — ABNORMAL LOW (ref 8.9–10.3)
Creatinine, Ser: 0.61 mg/dL (ref 0.44–1.00)
GFR calc non Af Amer: >60 mL/min (ref >60)
GLUCOSE: 95 mg/dL (ref 65–99)
Potassium: 3.1 mmol/L — ABNORMAL LOW (ref 3.5–5.1)
Sodium: 140 mmol/L (ref 135–145)

## 2015-03-23 LAB — TROPONIN I

## 2015-03-23 LAB — HEMOGLOBIN A1C
Hgb A1c MFr Bld: 6.2 % — ABNORMAL HIGH (ref 4.8–5.6)
Mean Plasma Glucose: 131 mg/dL

## 2015-03-23 LAB — MAGNESIUM: MAGNESIUM: 1.7 mg/dL (ref 1.7–2.4)

## 2015-03-23 MED ORDER — PREDNISONE 50 MG PO TABS
50.0000 mg | ORAL_TABLET | Freq: Every day | ORAL | Status: DC
  Administered 2015-03-23: 50 mg via ORAL
  Filled 2015-03-23: qty 1

## 2015-03-23 MED ORDER — DILTIAZEM HCL ER COATED BEADS 120 MG PO CP24
120.0000 mg | ORAL_CAPSULE | Freq: Every day | ORAL | Status: DC
  Administered 2015-03-23: 120 mg via ORAL
  Filled 2015-03-23: qty 1

## 2015-03-23 MED ORDER — PREDNISONE 50 MG PO TABS: 50.0000 mg | ORAL_TABLET | Freq: Every day | ORAL | Status: DC

## 2015-03-23 MED ORDER — DILTIAZEM HCL ER COATED BEADS 120 MG PO CP24: 120.0000 mg | ORAL_CAPSULE | Freq: Every day | ORAL | Status: DC

## 2015-03-23 MED ORDER — MAGNESIUM CHLORIDE 64 MG PO TBEC
1.0000 | DELAYED_RELEASE_TABLET | Freq: Once | ORAL | Status: AC
  Administered 2015-03-23: 64 mg via ORAL
  Filled 2015-03-23: qty 1

## 2015-03-23 MED ORDER — POTASSIUM CHLORIDE CRYS ER 20 MEQ PO TBCR
40.0000 meq | EXTENDED_RELEASE_TABLET | Freq: Two times a day (BID) | ORAL | Status: DC
  Administered 2015-03-23: 40 meq via ORAL
  Filled 2015-03-23: qty 2

## 2015-03-23 NOTE — Progress Notes (Signed)
Pt has orders to be discharged. Discharge instructions given and pt has no additional questions at this time. Medication regimen reviewed and pt educated. Pt verbalized understanding and has no additional questions. Telemetry box removed. IV removed and site in good condition. Pt stable and waiting for transportation.   Bethany Scherer RN 

## 2015-03-23 NOTE — Progress Notes (Signed)
SATURATION QUALIFICATIONS: (This note is used to comply with regulatory documentation for home oxygen)  Patient Saturations on Room Air at Rest = 97%  Patient Saturations on Room Air while Ambulating = 92%   

## 2015-03-23 NOTE — Consult Note (Signed)
CARDIOLOGY CONSULT NOTE   Patient ID: [name] White MRN: [id number], DOB/AGE: 60/31/1957   Admit date: 03/22/2015 Date of Consult: 03/23/2015  Primary Physician: Howard Pouch, DO Primary Cardiologist: Dr Lovena Le  Reason for consult:  Chest pain  Problem List  Past Medical History  Diagnosis Date  . Asthma   . Hypertension   . GERD (gastroesophageal reflux disease)   . Anxiety   . Depression   . Family history of anesthesia complication     "daughter; causes her to pass out afterwards"  . Pulmonary embolism 12/28/2013    "2 clots in each lung"  . Hyperthyroidism   . Migraine     "monthly" (12/28/2013)  . Osteoarthritis     "both knees; back of my neck; right pelvic bone" (12/28/2013)  . Chronic lower back pain   . Paroxysmal a-fib     Past Surgical History  Procedure Laterality Date  . Abdominal hysterectomy    . Wisdom tooth extraction    . Thyroidectomy, partial Right 2005  . Nasal sinus surgery  2007  . Appendectomy    . Excisional hemorrhoidectomy    . Dilation and curettage of uterus    . Tubal ligation    . Breast cyst excision Right      Allergies  Allergies  Allergen Reactions  . Caffeine Other (See Comments)    Migraine  . Vicodin [Hydrocodone-Acetaminophen] Nausea And Vomiting and Other (See Comments)    Headache   . Ciprofloxacin Hives  . Erythromycin Hives  . Lisinopril Cough  . Oxycodone Nausea And Vomiting    headache  . Sulfamethoxazole-Trimethoprim Rash    HPI   [name] White is a very pleasant 59 year old woman with a history of recurrent SVT, hypertension, and COPD. She is followed by Dr Lovena Le for SVT, on metoprolol and Flecainide. She presented with chest tightness and was diagnosed with acute asthma exacerbation.  The patient has a h/o significant asthma with intubation requirement in the past. Her chest pain started yesterday at church, it was pressure like mid chest relieved by breathing treatment. She still feels it, but its  significantly improved since yesterday. No worsening with exertion.    Inpatient Medications  . ARIPiprazole  5 mg Oral QHS  . aspirin EC  81 mg Oral Daily  . budesonide-formoterol  2 puff Inhalation BID  . flecainide  100 mg Oral Q12H  . Influenza vac split quadrivalent PF  0.5 mL Intramuscular Tomorrow-1000  . metoprolol  25 mg Oral BID  . pantoprazole  40 mg Oral Daily  . potassium chloride  40 mEq Oral BID  . rivaroxaban  20 mg Oral Q supper  . sertraline  25 mg Oral Daily    Family History Family History  Problem Relation Age of Onset  . Osteoarthritis Mother   . Asthma Mother   . Heart failure Mother      Social History Social History   Social History  . Marital Status: Single    Spouse Name: N/A  . Number of Children: N/A  . Years of Education: N/A   Occupational History  . Not on file.   Social History Main Topics  . Smoking status: Former Smoker -- 0.25 packs/day for 20 years    Types: Cigarettes    Quit date: 07/17/1999  . Smokeless tobacco: Never Used  . Alcohol Use: Yes     Comment: "drank some in my 30's"  . Drug Use: No  . Sexual Activity: No   Other Topics  Concern  . Not on file   Social History Narrative     Review of Systems  General:  No chills, fever, night sweats or weight changes.  Cardiovascular:  No chest pain, dyspnea on exertion, edema, orthopnea, palpitations, paroxysmal nocturnal dyspnea. Dermatological: No rash, lesions/masses Respiratory: No cough, dyspnea Urologic: No hematuria, dysuria Abdominal:   No nausea, vomiting, diarrhea, bright red blood per rectum, melena, or hematemesis Neurologic:  No visual changes, wkns, changes in mental status. All other systems reviewed and are otherwise negative except as noted above.  Physical Exam  Blood pressure 109/47, pulse 67, temperature 97.7 F (36.5 C), temperature source Oral, resp. rate 18, height 5\' 5"  (1.651 m), weight 248 lb 9.6 oz (112.764 kg), SpO2 96 %.  General:  Pleasant, NAD Psych: Normal affect. Neuro: Alert and oriented X 3. Moves all extremities spontaneously. HEENT: Normal  Neck: Supple without bruits or JVD. Lungs:  Resp regular and unlabored, CTA. Heart: RRR no s3, s4, or murmurs. Abdomen: Soft, non-tender, non-distended, BS + x 4.  Extremities: No clubbing, cyanosis or edema. DP/PT/Radials 2+ and equal bilaterally.  Labs   Recent Labs  03/22/15 1824 03/23/15 0113 03/23/15 0512  TROPONINI <0.03 <0.03 <0.03   Lab Results  Component Value Date   WBC 8.4 03/22/2015   HGB 12.8 03/22/2015   HCT 39.4 03/22/2015   MCV 88.3 03/22/2015   PLT 376 03/22/2015    Recent Labs Lab 03/23/15 0512  NA 140  K 3.1*  CL 107  CO2 25  BUN 8  CREATININE 0.61  CALCIUM 8.3*  GLUCOSE 95   Lab Results  Component Value Date   CHOL 171 03/23/2015   HDL 72 03/23/2015   LDLCALC 91 03/23/2015   TRIG 40 03/23/2015   Radiology/Studies  Dg Chest 2 View  03/22/2015   CLINICAL DATA:  Chest pain, shortness of breath, history of asthma and bilateral PEs  EXAM: CHEST  2 VIEW  COMPARISON:  09/27/2014  FINDINGS: Lungs are clear.  No pleural effusion or pneumothorax.  Mild cardiomegaly.  Surgical clips along the right superior mediastinum/neck.  Degenerative changes of the visualized thoracolumbar spine.  IMPRESSION: No evidence of acute cardiopulmonary disease.   Electronically Signed   By: Julian Hy M.D.   On: 03/22/2015 11:52   Ct Angio Chest Pe W/cm &/or Wo Cm  03/22/2015   CLINICAL DATA:  59 year old female with shortness breath and chest pain while attending church this morning. History of pulmonary embolism.   IMPRESSION: 1. No evidence pulmonary embolism. 2. No acute findings in the thorax to account for the patient's symptoms. 3. Additional incidental findings, as above.   Electronically Signed   By: Vinnie Langton M.D.   On: 03/22/2015 14:42   Echocardiogram - 02/03/2015  - Left ventricle: The cavity size was normal. Wall thickness  was increased in a pattern of moderate LVH. Systolic function was normal. The estimated ejection fraction was in the range of 60% to 65%. Wall motion was normal; there were no regional wall motion abnormalities. Doppler parameters are consistent with abnormal left ventricular relaxation (grade 1 diastolic dysfunction). The E/e&' ratio is between 8-15, suggesting indeterminate LV filling pressure. - Mitral valve: There was mild regurgitation. - Left atrium: The atrium was normal in size. - Right atrium: The atrium was mildly dilated. - Tricuspid valve: Poorly visualized. There was mild regurgitation. - Pulmonary arteries: PA peak pressure: 34 mm Hg (S).  Impressions: - LVEF 60-65%, moderate LVH, normal LA size, mild RAE, borderline pulmonary  hypertension, diastolic dysfunction, indeterminate LV fililng pressure.   ECG: SR, 1. AVB, non-specific T wave abnormalities     ASSESSMENT AND PLAN  59 year old female  1. Atypical chest pain  - associated with asthma exacerbation - ECG shws no ischemic changes and troponin is negative x 3 - I would schedule a stress test about a week from now when she recovers from asthma attack - she will need a treadmill stress test, with her severe asthma and prior h/o intubation I would avoid Lexiscan  2. HTN  - controlled  3. Paroxysmal sustained SVT - No recent episodes - On Rivaroxaban. - Pt on flecanide, I would switch metoprolol to cardizem CD 120 mg po daily to avoid bronchospasm   Signed, Dorothy Spark, MD, [facility] 03/23/2015, 9:16 AM

## 2015-03-23 NOTE — Progress Notes (Signed)
Family Medicine Teaching Service Daily Progress Note Intern Pager: [phone]  Patient name: [name] White Medical record number: [id number] Date of birth: [birth year] Age: 59 y.o. Gender: female  Primary Care Provider: Howard Pouch, DO Consultants: none Code Status: FULL  9/25: admitted for ACS r/o with atypical chest pain 9/26: called [facility]; will see patient   Assessment and Plan: [name] White is a 59 y.o. female presenting with central chest tightness . PMH is significant for HTN, Paroxysmal A-fib, Asthma, GERD, hx PE, Depression/Anxiety, OA.   Atypical Chest Pain: Chest tightness at rest associated with some wheezing, lightheadedness/weakness. Wheezing improved with albuterol neb by EMS. Chest tightness improved with nitro in ED but has not fully resolved. Soreness to palpation of sternal area. No prior history of MI or prior cath. Likely asthma related vs musculoskeletal vs MI (less likely as no ischemic changes on EKG and trop neg) vs PE (neg CTA). EKG shows Afib without signs of ischemia. I-stat troponin 0.01. CTA neg for PE. VSS.EKG on admission: sinus rhythm, borderline T wave abnormalities, prolonged QTc at 513. Breathing well on RA.  - Admit to observation under Dr McDiarmid for ACS rule out - on telemetry - Vitals per floor protocol.Monitor O2. - EKG AM: Sinus rhythm with 1st degree AV block (not new per EKG 09/2014) with nonspecific T wave changes, prolonged QTc 465  - Troponin  q 6 x 3: negative - Risk stratification labs: Chol 171, HDL 72, LDL 91, TAG 40. TSH 0.840, A1c in process. - consulted [facility] (sees Dr. Cristopher Peru at an outpatient)  Paroxysmal Afib: EKG on admission read at sinus rhythm, borderline T wave abnormalities, prolonged QTc at 513. Rate controlled with HR in 70s-80s. On xarelto 20mg  daily, Metoprolol 25 BID, Flecainide 100mg  BID. [facility] (Dr. Lovena Le). ECHO 2015: LVEF 50-65%, mild RAE, borderilne pulm HTN, G1DD.  CHADSVasc score: 3. Blood pressures stable; HR 70-80s with 59 this AM.  - continue home meds: Xarelto 20mg  daily,Flecainide 100mg  BID, Metoprolol 25BID  Hypokalemia: K 3.1 9/26 (from 3.6) - replete today Kdur 40 mEq BID today - Mag ordered:1.7; will replet with MgCl 1 tab  History of PE: On Xarelto. CTA negative. Wells score 1.5. - continue Xarelto  Depression/Anxiety:  - Zoloft 25mg  daily  - Trazodone 100mg  for sleep  GERD:  - Protonix 40mg  daily - GI cocktail PRN  FEN/GI: Cardiac diet Prophylaxis: Xarelto  Disposition: admitted to telemetry for observation and r/o ACS  Subjective:  Patient states she is doing well. States she does not have chest tightness at rest while in bed. She states she still has some slight soreness with she palpates her sternum and when she tries to sit up in bed. Denies sob, n/v/abdominal pain.   Objective: Temp:  [97.3 F (36.3 C)-98.5 F (36.9 C)] 97.7 F (36.5 C) (09/26 0833) Pulse Rate:  [59-106] 67 (09/26 0833) Resp:  [13-27] 18 (09/26 0833) BP: (105-155)/(47-109) 109/47 mmHg (09/26 0833) SpO2:  [94 %-100 %] 96 % (09/26 0833) Weight:  [248 lb 9.6 oz (112.764 kg)-250 lb (113.399 kg)] 248 lb 9.6 oz (112.764 kg) (09/26 0603) Physical Exam: General: NAD, awake, alert  Cardiovascular: Regular rate, possible systolic murmur 2-7/5 best heard in left upper sternal border; mild soreness to palpation of central chest wall  Respiratory: CTAB bilaterally Abdomen: soft, NT, ND.  Extremities: no LE edema no calf tenderness  Laboratory:  Recent Labs Lab 03/22/15 1054  WBC 8.4  HGB 12.8  HCT 39.4  PLT  [place] [facility] 03/22/15 1054 03/23/15 0512  NA 139 140  K 3.6 3.1*  CL 107 107  CO2 23 25  BUN 10 8  CREATININE 0.71 0.61  CALCIUM 9.2 8.3*  GLUCOSE 109* 95    Imaging/Diagnostic Tests: EKG: Sinus rhythm with 1st degree AV block (not new per EKG 09/2014) with nonspecific T wave changes, prolonged QTc 465   Smiley Houseman, MD 03/23/2015, 9:10 AM PGY-1, [place] Intern pager: [phone], text pages welcome

## 2015-03-23 NOTE — Discharge Instructions (Signed)
We are so glad to see that you are feeling better.  You were admitted for chest pain.  You were ruled out for PE and for heart attack. The chest tightness is likely due to an asthma exacerbation. You were started on Prednisone 50mg  daily. You took your first dose of prednisone today at the hospital; take your next dose on 9/27. Please use your albuterol inhaler or nebulizer as needed for wheezing.  You should follow up with Dr Lovena Le as scheduled.  You also have a follow up visit with [facility] 04/02/15.  Please make sure to go to your appointments as scheduled.  Your cardiologist changed one of your medications: Metoprolol was discontinued during this admission, and you were started on Cardizem 120mg  daily. Please take your medications as prescribed.    Chest Pain (Nonspecific) It is often hard to give a specific diagnosis for the cause of chest pain. There is always a chance that your pain could be related to something serious, such as a heart attack or a blood clot in the lungs. You need to follow up with your health care provider for further evaluation. CAUSES   Heartburn.  Pneumonia or bronchitis.  Anxiety or stress.  Inflammation around your heart (pericarditis) or lung (pleuritis or pleurisy).  A blood clot in the lung.  A collapsed lung (pneumothorax). It can develop suddenly on its own (spontaneous pneumothorax) or from trauma to the chest.  Shingles infection (herpes zoster virus). The chest wall is composed of bones, muscles, and cartilage. Any of these can be the source of the pain.  The bones can be bruised by injury.  The muscles or cartilage can be strained by coughing or overwork.  The cartilage can be affected by inflammation and become sore (costochondritis). DIAGNOSIS  Lab tests or other studies may be needed to find the cause of your pain. Your health care provider may have you take a test called an ambulatory electrocardiogram (ECG). An ECG records your  heartbeat patterns over a 24-hour period. You may also have other tests, such as:  Transthoracic echocardiogram (TTE). During echocardiography, sound waves are used to evaluate how blood flows through your heart.  Transesophageal echocardiogram (TEE).  Cardiac monitoring. This allows your health care provider to monitor your heart rate and rhythm in real time.  Holter monitor. This is a portable device that records your heartbeat and can help diagnose heart arrhythmias. It allows your health care provider to track your heart activity for several days, if needed.  Stress tests by exercise or by giving medicine that makes the heart beat faster. TREATMENT   Treatment depends on what may be causing your chest pain. Treatment may include:  Acid blockers for heartburn.  Anti-inflammatory medicine.  Pain medicine for inflammatory conditions.  Antibiotics if an infection is present.  You may be advised to change lifestyle habits. This includes stopping smoking and avoiding alcohol, caffeine, and chocolate.  You may be advised to keep your head raised (elevated) when sleeping. This reduces the chance of acid going backward from your stomach into your esophagus. Most of the time, nonspecific chest pain will improve within 2-3 days with rest and mild pain medicine.  HOME CARE INSTRUCTIONS   If antibiotics were prescribed, take them as directed. Finish them even if you start to feel better.  For the next few days, avoid physical activities that bring on chest pain. Continue physical activities as directed.  Do not use any tobacco products, including cigarettes, chewing tobacco, or  electronic cigarettes.  Avoid drinking alcohol.  Only take medicine as directed by your health care provider.  Follow your health care provider's suggestions for further testing if your chest pain does not go away.  Keep any follow-up appointments you made. If you do not go to an appointment, you could develop  lasting (chronic) problems with pain. If there is any problem keeping an appointment, call to reschedule. SEEK MEDICAL CARE IF:   Your chest pain does not go away, even after treatment.  You have a rash with blisters on your chest.  You have a fever. SEEK IMMEDIATE MEDICAL CARE IF:   You have increased chest pain or pain that spreads to your arm, neck, jaw, back, or abdomen.  You have shortness of breath.  You have an increasing cough, or you cough up blood.  You have severe back or abdominal pain.  You feel nauseous or vomit.  You have severe weakness.  You faint.  You have chills. This is an emergency. Do not wait to see if the pain will go away. Get medical help at once. Call your local emergency services (911 in [place] Do not drive yourself to the hospital. MAKE SURE YOU:   Understand these instructions.  Will watch your condition.  Will get help right away if you are not doing well or get worse. Document Released: 03/23/2005 Document Revised: 06/18/2013 Document Reviewed: 01/17/2008 [facility] Patient Information 2015 [place], [place]. This information is not intended to replace advice given to you by your health care provider. Make sure you discuss any questions you have with your health care provider.  Information on my medicine - XARELTO (Rivaroxaban)  This medication education was reviewed with me or my healthcare representative as part of my discharge preparation.  The pharmacist that spoke with me during my hospital stay was:  Brain Hilts, [facility]  Why was Xarelto prescribed for you? Xarelto was prescribed for you to reduce the risk of a blood clot forming that can cause a stroke if you have a medical condition called atrial fibrillation (a type of irregular heartbeat).  What do you need to know about xarelto ? Take your Xarelto ONCE DAILY at the same time every day with your evening meal. If you have difficulty swallowing the tablet whole, you may  crush it and mix in applesauce just prior to taking your dose.  Take Xarelto exactly as prescribed by your doctor and DO NOT stop taking Xarelto without talking to the doctor who prescribed the medication.  Stopping without other stroke prevention medication to take the place of Xarelto may increase your risk of developing a clot that causes a stroke.  Refill your prescription before you run out.  After discharge, you should have regular check-up appointments with your healthcare provider that is prescribing your Xarelto.  In the future your dose may need to be changed if your kidney function or weight changes by a significant amount.  What do you do if you miss a dose? If you are taking Xarelto ONCE DAILY and you miss a dose, take it as soon as you remember on the same day then continue your regularly scheduled once daily regimen the next day. Do not take two doses of Xarelto at the same time or on the same day.   Important Safety Information A possible side effect of Xarelto is bleeding. You should call your healthcare provider right away if you experience any of the following: ? Bleeding from an injury or your nose that  does not stop. °? Unusual colored urine (red or dark brown) or unusual colored stools (red or black). °? Unusual bruising for unknown reasons. °? A serious fall or if you hit your head (even if there is no bleeding). ° °Some medicines may interact with Xarelto® and might increase your risk of bleeding while on Xarelto®. To help avoid this, consult your healthcare provider or pharmacist prior to using any new prescription or non-prescription medications, including herbals, vitamins, non-steroidal anti-inflammatory drugs (NSAIDs) and supplements. ° °This website has more information on Xarelto®: www.xarelto.com. ° ° °

## 2015-03-23 NOTE — Discharge Summary (Signed)
[name] White Medical record number: [id number] Date of birth: March 02, 1956 Age: 59 y.o. Gender: female Date of Admission: 03/22/2015  Date of Discharge: 03/23/15 Admitting Physician: Blane Ohara McDiarmid, MD  Primary Care Provider: Howard Pouch, DO Consultants: Cardiology   Indication for Hospitalization: atypical chest pain, rule out ACS  Discharge Diagnoses/Problem List:  Atypical Chest Pain likely musculoskeletal vs asthma related  Disposition: home  Discharge Condition: improved  Brief Hospital Course:  [name] White is a 59 y.o. female presenting with central chest tightness. PMH is significant for HTN, Paroxysmal A-fib, Asthma, GERD, hx PE, Depression/Anxiety, OA, and partial thyroidectomy.   Atypical Chest Pain:  Patient presented with chest tightness at rest associated with some wheezing, lightheadedness/weakness. Wheezing improved with albuterol nebullizer by EMS. Chest tightness improved with nitro in ED but did not fully resolve. Patient did have soreness to palpation of sternal area. Patient has no history of prior MI or heart catheterization. Initial EKG showed what seemed to be atrial flutter without ischemic changes. Serum troponins were negative x 3. CTA chest showed no evidence of PE. Risk stratification labs completed included Lipid panel, TSH, and Hemoblobin A1c. Lipid Panel:Chol 171, HDL 72, LDL 91, TAG 40.  TSH was 0.840. Hemoglobin A1c pending at discharge. Patient's chest discomfort improved, and patient only had minimal soreness with palpation of sternal area and with sitting up in bed. Her symptoms were thought to be due to asthma exacerbation vs musculoskeletal in etiology. Cardiology was consulted and recommended outpatient stress test (treadmill test). Additionally cardiology recommended avoiding Lexiscan due to patient's history of severe asthma and prior history of intubation. Patient was  scheduled a follow up appointment with Cardiology.   Asthma Exacerbation: Patinet did not require further treatment with albuterol nebulizers while in the hospital. She was started on a 5 day Prednisone burst which she is to complete at home. Her lung exam was unremarkable for wheezing/crackles on day of discharge. Patient was able to ambulate without desaturations or symptoms of increased work of breathing prior to discharge.    Paroxysmal Sustained SVT:  Patient was noted to have atrial flutter on EKG on admission. Repeat EKG showed Sinus rhythm with 1st degree AV block (not new per EKG 09/2014), prolonged QTc 465. Cardiology was consulted and recommended switching Metoprolol to Cardizem 120mg  daily to avoid bronchospasm and continuing Flecanide and Rivaroxaban.   Hypokalemia:  Patient was found to have Potassium of 3.1, which was repleted on day of discharge.   History of PE:  Patient was continued on Xarelto. CTA chest was negative for acute PE.   Depression/Anxiety:  Patient was continued on home Zoloft and Trazodone for sleep  Issues for Follow Up:  - f/u on asthma symptoms after completion of prednisone burst - consider repeat BMET + Magnesium to check electrolytes - ensure patient has followed up with Dr. Lovena Le (her cardiologist)  Significant Procedures: none  Significant Labs and Imaging:   Recent Labs Lab 03/22/15 1054  WBC 8.4  HGB 12.8  HCT 39.4  PLT 376    Recent Labs Lab 03/22/15 1054 03/23/15 0512  NA 139 140  K 3.6 3.1*  CL 107 107  CO2 23 25  GLUCOSE 109* 95  BUN 10 8  CREATININE 0.71 0.61  CALCIUM 9.2 8.3*  MG  --  1.7   CXR:  FINDINGS: Lungs are clear. No pleural effusion or pneumothorax.  Mild cardiomegaly.  Surgical clips along the right superior mediastinum/neck.  Degenerative changes of the visualized thoracolumbar spine.  IMPRESSION: No evidence of acute cardiopulmonary disease.  CTA Chest: FINDINGS: Mediastinum/Lymph Nodes:  There are no filling defects within the pulmonary arterial tree to suggest underlying pulmonary embolism. Heart size is normal. There is no significant pericardial fluid, thickening or pericardial calcification. No pathologically enlarged mediastinal or hilar lymph nodes. Esophagus is unremarkable in appearance. Status post right hemithyroidectomy. No axillary lymphadenopathy.  Lungs/Pleura: Areas of dependent atelectasis are noted in the lower lobes of the lungs bilaterally. No acute consolidative airspace disease. No pleural effusions. No suspicious appearing pulmonary nodules or masses.  Upper Abdomen: 1 cm low-attenuation lesion in segment 3 of the liver is incompletely characterized, but likely to represent a cyst.  Musculoskeletal/Soft Tissues: There are no aggressive appearing lytic or blastic lesions noted in the visualized portions of the skeleton.  Review of the MIP images confirms the above findings.  IMPRESSION: 1. No evidence pulmonary embolism. 2. No acute findings in the thorax to account for the patient's symptoms. 3. Additional incidental findings, as above.  Results/Tests Pending at Time of Discharge: Hemoglobin A1c  Discharge Medications:    Medication List    STOP taking these medications        hydrochlorothiazide 25 MG tablet  Commonly known as:  HYDRODIURIL     metoprolol 50 MG tablet  Commonly known as:  LOPRESSOR      TAKE these medications        acetaminophen 650 MG CR tablet  Commonly known as:  TYLENOL  Take 650 mg by mouth every 8 (eight) hours as needed for pain.     albuterol 108 (90 BASE) MCG/ACT inhaler  Commonly known as:  PROVENTIL HFA;VENTOLIN HFA  Inhale 2 puffs into the lungs every 6 (six) hours as needed. For shortness of breath     albuterol (2.5 MG/3ML) 0.083% nebulizer solution  Commonly known as:  PROVENTIL  Take 3 mLs (2.5 mg total) by nebulization every 6 (six) hours as needed. For shortness of breath      ARIPiprazole 5 MG tablet  Commonly known as:  ABILIFY  Take 1 tablet (5 mg total) by mouth at bedtime.     azelastine 0.05 % ophthalmic solution  Commonly known as:  OPTIVAR  Place 1 drop into both eyes 2 (two) times daily as needed. For dry/irritated eyes     budesonide-formoterol 160-4.5 MCG/ACT inhaler  Commonly known as:  SYMBICORT  Inhale 2 puffs into the lungs 2 (two) times daily.     DEXILANT 60 MG capsule  Generic drug:  dexlansoprazole  Take 60 mg by mouth every morning.     diclofenac sodium 1 % Gel  Commonly known as:  VOLTAREN  Apply 2 g topically 2 (two) times daily as needed (For pain or inflammation on knees.).     diltiazem 120 MG 24 hr capsule  Commonly known as:  CARDIZEM CD  Take 1 capsule (120 mg total) by mouth daily.     flecainide 100 MG tablet  Commonly known as:  TAMBOCOR  Take 1 tablet (100 mg total) by mouth every 12 (twelve) hours.     Olopatadine HCl 0.6 % Soln  Place 1 puff into the nose at bedtime as needed (For seasonal allergies.).     predniSONE 50 MG tablet  Commonly known as:  DELTASONE  Take 1 tablet (50 mg total) by mouth daily with breakfast.     senna-docusate 8.6-50 MG per tablet  Commonly known as:  Senokot-S  Take  1 tablet by mouth at bedtime as needed for mild constipation.     sertraline 25 MG tablet  Commonly known as:  ZOLOFT  Take 1 tablet (25 mg total) by mouth daily.     traMADol 50 MG tablet  Commonly known as:  ULTRAM  Take 1 tablet (50 mg total) by mouth every 8 (eight) hours as needed.     traZODone 100 MG tablet  Commonly known as:  DESYREL  Take 100 mg by mouth at bedtime as needed for sleep.     XARELTO 20 MG Tabs tablet  Generic drug:  rivaroxaban  Take 20 mg by mouth daily after supper.        Discharge Instructions: Please refer to Patient Instructions section of EMR for full details.  Patient was counseled important signs and symptoms that should prompt return to medical care, changes in  medications, dietary instructions, activity restrictions, and follow up appointments.   Follow-Up Appointments: Follow-up Information    Follow up with Smiley Houseman, MD. Go on 04/02/2015.   Specialty:  Family Medicine   Why:  10:00 am (hospital follow up)   Contact information:   [place] [address] [phone]       Follow up with Cristopher Peru, MD. Go on 03/31/2015.   Specialty:  Cardiology   Why:  4:00 pm   Contact information:   [address] [place] [address] [phone]       Smiley Houseman, MD 03/23/2015, 7:28 PM PGY-1, [place]

## 2015-03-30 ENCOUNTER — Other Ambulatory Visit: Payer: Self-pay

## 2015-03-31 ENCOUNTER — Ambulatory Visit (INDEPENDENT_AMBULATORY_CARE_PROVIDER_SITE_OTHER): Payer: Medicare PPO | Admitting: Internal Medicine

## 2015-03-31 ENCOUNTER — Encounter: Payer: Self-pay | Admitting: Internal Medicine

## 2015-03-31 VITALS — BP 114/82 | HR 132 | Ht 65.0 in | Wt 251.0 lb

## 2015-03-31 DIAGNOSIS — I471 Supraventricular tachycardia: Secondary | ICD-10-CM | POA: Diagnosis not present

## 2015-03-31 DIAGNOSIS — I10 Essential (primary) hypertension: Secondary | ICD-10-CM

## 2015-03-31 DIAGNOSIS — E669 Obesity, unspecified: Secondary | ICD-10-CM | POA: Diagnosis not present

## 2015-03-31 DIAGNOSIS — I483 Typical atrial flutter: Secondary | ICD-10-CM | POA: Diagnosis not present

## 2015-03-31 DIAGNOSIS — I4892 Unspecified atrial flutter: Secondary | ICD-10-CM | POA: Insufficient documentation

## 2015-03-31 NOTE — Assessment & Plan Note (Signed)
Her blood pressure is well controlled. No change in her medications.

## 2015-03-31 NOTE — Patient Instructions (Signed)
Medication Instructions:  Your physician recommends that you continue on your current medications as directed. Please refer to the Current Medication list given to you today.   Labwork: None ordered   Testing/Procedures: Your physician has recommended that you have an ablation. Catheter ablation is a medical procedure used to treat some cardiac arrhythmias (irregular heartbeats). During catheter ablation, a long, thin, flexible tube is put into a blood vessel in your groin (upper thigh), or neck. This tube is called an ablation catheter. It is then guided to your heart through the blood vessel. Radio frequency waves destroy small areas of heart tissue where abnormal heartbeats may cause an arrhythmia to start. Please see the instruction sheet given to you today.  Dates: 10/14, 10/16, 10/25, 10/30, 11/2, 11/14, 11/22   Follow-Up: To be determined  Any Other Special Instructions Will Be Listed Below (If Applicable).

## 2015-03-31 NOTE — Assessment & Plan Note (Signed)
When she undergoes catheter ablation, will anticipate attempting to induce atrial tachy as well.

## 2015-03-31 NOTE — Assessment & Plan Note (Signed)
The patient has developed recurrent atrial flutter with an RVR. I have discussed the treatment options. She is on flecainide. She is anti-coagulated. Will schedule catheter ablation. I would anticipate she remain on flecainide and anti-coagulation after the procedure.

## 2015-03-31 NOTE — Assessment & Plan Note (Signed)
She has done a nice job losing weight. She is encouraged to continue.

## 2015-03-31 NOTE — Progress Notes (Signed)
HPI [name] White returns today for followup. She is a very pleasant 59 year old woman with a history of recurrent SVT, hypertension, and COPD. She continues to have palpitations. She returns today for followup. She has lost over 50 lbs. No chest pain. She has noted that her heart has been racing some despite flecainide.  Allergies  Allergen Reactions  . Caffeine Other (See Comments)    Migraine  . Vicodin [Hydrocodone-Acetaminophen] Nausea And Vomiting and Other (See Comments)    Headache   . Ciprofloxacin Hives  . Erythromycin Hives  . Lisinopril Cough  . Oxycodone Nausea And Vomiting    headache  . Sulfamethoxazole-Trimethoprim Rash     Current Outpatient Prescriptions  Medication Sig Dispense Refill  . acetaminophen (TYLENOL) 650 MG CR tablet Take 650 mg by mouth every 8 (eight) hours as needed for pain.    Marland Kitchen albuterol (PROVENTIL HFA;VENTOLIN HFA) 108 (90 BASE) MCG/ACT inhaler Inhale 2 puffs into the lungs every 6 (six) hours as needed. For shortness of breath    . albuterol (PROVENTIL) (2.5 MG/3ML) 0.083% nebulizer solution Take 3 mLs (2.5 mg total) by nebulization every 6 (six) hours as needed. For shortness of breath 75 mL 12  . ARIPiprazole (ABILIFY) 5 MG tablet Take 1 tablet (5 mg total) by mouth at bedtime. 60 tablet 3  . azelastine (OPTIVAR) 0.05 % ophthalmic solution Place 1 drop into both eyes 2 (two) times daily as needed. For dry/irritated eyes    . budesonide-formoterol (SYMBICORT) 160-4.5 MCG/ACT inhaler Inhale 2 puffs into the lungs 2 (two) times daily.      Marland Kitchen dexlansoprazole (DEXILANT) 60 MG capsule Take 60 mg by mouth every morning.     . diclofenac sodium (VOLTAREN) 1 % GEL Apply 2 g topically 2 (two) times daily as needed (For pain or inflammation on knees.).     Marland Kitchen diltiazem (CARDIZEM CD) 120 MG 24 hr capsule Take 1 capsule (120 mg total) by mouth daily. 30 capsule 0  . flecainide (TAMBOCOR) 100 MG tablet Take 1 tablet (100 mg total) by mouth every 12  (twelve) hours. 180 tablet 3  . Olopatadine HCl 0.6 % SOLN Place 1 puff into the nose at bedtime as needed (For seasonal allergies.).     Marland Kitchen senna-docusate (SENOKOT-S) 8.6-50 MG per tablet Take 1 tablet by mouth at bedtime as needed for mild constipation. 30 tablet 3  . sertraline (ZOLOFT) 25 MG tablet Take 1 tablet (25 mg total) by mouth daily. 60 tablet 3  . traMADol (ULTRAM) 50 MG tablet Take 50 mg by mouth every 8 (eight) hours as needed (pain).    . traZODone (DESYREL) 100 MG tablet Take 100 mg by mouth at bedtime as needed for sleep.    Alveda Reasons 20 MG TABS tablet Take 20 mg by mouth daily after supper.  1   Current Facility-Administered Medications  Medication Dose Route Frequency Provider Last Rate Last Dose  . diphenhydrAMINE (BENADRYL) capsule 50 mg  50 mg Oral Q6H PRN Boykin Nearing, MD   50 mg at 01/06/12 1458     Past Medical History  Diagnosis Date  . Asthma   . Hypertension   . GERD (gastroesophageal reflux disease)   . Anxiety   . Depression   . Family history of anesthesia complication     "daughter; causes her to pass out afterwards"  . Pulmonary embolism ([facility]) 12/28/2013    "2 clots in each lung"  . Hyperthyroidism   . Migraine     "  monthly" (12/28/2013)  . Osteoarthritis     "both knees; back of my neck; right pelvic bone" (12/28/2013)  . Chronic lower back pain   . Paroxysmal a-fib ([facility])     ROS:   All systems reviewed and negative except as noted in the HPI.   Past Surgical History  Procedure Laterality Date  . Abdominal hysterectomy    . Wisdom tooth extraction    . Thyroidectomy, partial Right 2005  . Nasal sinus surgery  2007  . Appendectomy    . Excisional hemorrhoidectomy    . Dilation and curettage of uterus    . Tubal ligation    . Breast cyst excision Right      Family History  Problem Relation Age of Onset  . Osteoarthritis Mother   . Asthma Mother   . Heart failure Mother      Social History   Social History  . Marital Status:  Single    Spouse Name: N/A  . Number of Children: N/A  . Years of Education: N/A   Occupational History  . Not on file.   Social History Main Topics  . Smoking status: Former Smoker -- 0.25 packs/day for 20 years    Types: Cigarettes    Quit date: 07/17/1999  . Smokeless tobacco: Never Used  . Alcohol Use: Yes     Comment: "drank some in my 30's"  . Drug Use: No  . Sexual Activity: No   Other Topics Concern  . Not on file   Social History Narrative     BP 114/82 mmHg  Pulse 132  Ht 5\' 5"  (1.651 m)  Wt 251 lb (113.853 kg)  BMI 41.77 kg/m2  Physical Exam:  Well appearing 59 yo woman, NAD HEENT: Unremarkable Neck:  7 cm JVD, no thyromegally Back:  No CVA tenderness Lungs:  Clear with no wheezes HEART:  Regular rate rhythm, no murmurs, no rubs, no clicks Abd:  soft, positive bowel sounds, no organomegally, no rebound, no guarding Ext:  2 plus pulses, no edema, no cyanosis, no clubbing Skin:  No rashes no nodules Neuro:  CN II through XII intact, motor grossly intact  ECG - atrial tachycardia transitioning into atrial flutter.  Assess/Plan:

## 2015-04-01 ENCOUNTER — Encounter (HOSPITAL_COMMUNITY): Payer: Self-pay | Admitting: Emergency Medicine

## 2015-04-01 ENCOUNTER — Emergency Department (HOSPITAL_COMMUNITY)
Admission: EM | Admit: 2015-04-01 | Discharge: 2015-04-01 | Disposition: A | Discharge status: Home / Self Care | Payer: Medicare PPO | Attending: Emergency Medicine | Admitting: Emergency Medicine

## 2015-04-01 ENCOUNTER — Emergency Department (HOSPITAL_COMMUNITY): Payer: Medicare PPO

## 2015-04-01 DIAGNOSIS — G8929 Other chronic pain: Secondary | ICD-10-CM | POA: Diagnosis not present

## 2015-04-01 DIAGNOSIS — R079 Chest pain, unspecified: Secondary | ICD-10-CM | POA: Insufficient documentation

## 2015-04-01 DIAGNOSIS — R000 Tachycardia, unspecified: Secondary | ICD-10-CM | POA: Diagnosis not present

## 2015-04-01 DIAGNOSIS — Z86711 Personal history of pulmonary embolism: Secondary | ICD-10-CM | POA: Insufficient documentation

## 2015-04-01 DIAGNOSIS — E0590 Thyrotoxicosis, unspecified without thyrotoxic crisis or storm: Secondary | ICD-10-CM | POA: Diagnosis not present

## 2015-04-01 DIAGNOSIS — F329 Major depressive disorder, single episode, unspecified: Secondary | ICD-10-CM | POA: Insufficient documentation

## 2015-04-01 DIAGNOSIS — K219 Gastro-esophageal reflux disease without esophagitis: Secondary | ICD-10-CM | POA: Insufficient documentation

## 2015-04-01 DIAGNOSIS — Z79899 Other long term (current) drug therapy: Secondary | ICD-10-CM | POA: Insufficient documentation

## 2015-04-01 DIAGNOSIS — F419 Anxiety disorder, unspecified: Secondary | ICD-10-CM | POA: Diagnosis not present

## 2015-04-01 DIAGNOSIS — R002 Palpitations: Secondary | ICD-10-CM | POA: Diagnosis not present

## 2015-04-01 DIAGNOSIS — Z87891 Personal history of nicotine dependence: Secondary | ICD-10-CM | POA: Insufficient documentation

## 2015-04-01 DIAGNOSIS — I10 Essential (primary) hypertension: Secondary | ICD-10-CM | POA: Insufficient documentation

## 2015-04-01 DIAGNOSIS — J45909 Unspecified asthma, uncomplicated: Secondary | ICD-10-CM | POA: Diagnosis not present

## 2015-04-01 LAB — I-STAT TROPONIN, ED: Troponin i, poc: 0 ng/mL (ref 0.00–0.08)

## 2015-04-01 LAB — CBC
HCT: 40 % (ref 36.0–46.0)
Hemoglobin: 12.8 g/dL (ref 12.0–15.0)
MCH: 28.4 pg (ref 26.0–34.0)
MCHC: 32 g/dL (ref 30.0–36.0)
MCV: 88.7 fL (ref 78.0–100.0)
Platelets: 419 10*3/uL — ABNORMAL HIGH (ref 150–400)
RBC: 4.51 MIL/uL (ref 3.87–5.11)
RDW: 16.6 % — ABNORMAL HIGH (ref 11.5–15.5)
WBC: 9.3 10*3/uL (ref 4.0–10.5)

## 2015-04-01 LAB — BASIC METABOLIC PANEL
Anion gap: 11 (ref 5–15)
BUN: 9 mg/dL (ref 6–20)
CO2: 25 mmol/L (ref 22–32)
Calcium: 8.9 mg/dL (ref 8.9–10.3)
Chloride: 103 mmol/L (ref 101–111)
Creatinine, Ser: 0.73 mg/dL (ref 0.44–1.00)
GFR calc Af Amer: >60 mL/min (ref >60)
GFR calc non Af Amer: >60 mL/min (ref >60)
Glucose, Bld: 114 mg/dL — ABNORMAL HIGH (ref 65–99)
Potassium: 2.9 mmol/L — ABNORMAL LOW (ref 3.5–5.1)
Sodium: 139 mmol/L (ref 135–145)

## 2015-04-01 MED ORDER — LORAZEPAM 2 MG/ML IJ SOLN
1.0000 mg | Freq: Once | INTRAMUSCULAR | Status: AC
  Administered 2015-04-01: 1 mg via INTRAVENOUS
  Filled 2015-04-01: qty 1

## 2015-04-01 MED ORDER — POTASSIUM CHLORIDE CRYS ER 20 MEQ PO TBCR
40.0000 meq | EXTENDED_RELEASE_TABLET | Freq: Once | ORAL | Status: AC
  Administered 2015-04-01: 40 meq via ORAL
  Filled 2015-04-01: qty 2

## 2015-04-01 NOTE — ED Provider Notes (Signed)
CSN: [id number]     Arrival date & time 04/01/15  2058 History   First MD Initiated Contact with Patient 04/01/15 2118     Chief Complaint  Patient presents with  . Chest Pain     (Consider location/radiation/quality/duration/timing/severity/associated sxs/prior Treatment) HPI Comments: Patient presents to the emergency department with chief complaint of heart palpitations. She states that she has had a racing heart today. States that she has felt some chest pressure. She was seen yesterday by her cardiologist (Dr. Lovena Le), and was advised that she have an ablation.  She is in the process of scheduling this. She is currently taking Flecanide and Cardizem.  She has also had some wheezing in her lower lobes per EMS.  She was given and albuterol treatment PTA with good relief.    The history is provided by the patient. No language interpreter was used.    Past Medical History  Diagnosis Date  . Asthma   . Hypertension   . GERD (gastroesophageal reflux disease)   . Anxiety   . Depression   . Family history of anesthesia complication     "daughter; causes her to pass out afterwards"  . Pulmonary embolism ([place]) 12/28/2013    "2 clots in each lung"  . Hyperthyroidism   . Migraine     "monthly" (12/28/2013)  . Osteoarthritis     "both knees; back of my neck; right pelvic bone" (12/28/2013)  . Chronic lower back pain   . Paroxysmal a-fib [facility])    Past Surgical History  Procedure Laterality Date  . Abdominal hysterectomy    . Wisdom tooth extraction    . Thyroidectomy, partial Right 2005  . Nasal sinus surgery  2007  . Appendectomy    . Excisional hemorrhoidectomy    . Dilation and curettage of uterus    . Tubal ligation    . Breast cyst excision Right    Family History  Problem Relation Age of Onset  . Osteoarthritis Mother   . Asthma Mother   . Heart failure Mother    Social History  Substance Use Topics  . Smoking status: Former Smoker -- 0.25 packs/day for 20 years     Types: Cigarettes    Quit date: 07/17/1999  . Smokeless tobacco: Never Used  . Alcohol Use: Yes     Comment: "drank some in my 30's"   OB History    No data available     Review of Systems  Constitutional: Negative for fever and chills.  Respiratory: Negative for shortness of breath.   Cardiovascular: Positive for chest pain and palpitations.  Gastrointestinal: Negative for nausea, vomiting, diarrhea and constipation.  Genitourinary: Negative for dysuria.  All other systems reviewed and are negative.     Allergies  Caffeine; Vicodin; Ciprofloxacin; Erythromycin; Lisinopril; Oxycodone; and Sulfamethoxazole-trimethoprim  Home Medications   Prior to Admission medications   Medication Sig Start Date End Date Taking? Authorizing Provider  albuterol (PROVENTIL HFA;VENTOLIN HFA) 108 (90 BASE) MCG/ACT inhaler Inhale 2 puffs into the lungs every 6 (six) hours as needed. For shortness of breath   Yes Historical Provider, MD  albuterol (PROVENTIL) (2.5 MG/3ML) 0.083% nebulizer solution Take 3 mLs (2.5 mg total) by nebulization every 6 (six) hours as needed. For shortness of breath 04/07/13  Yes Hilton Sinclair, MD  ARIPiprazole (ABILIFY) 5 MG tablet Take 1 tablet (5 mg total) by mouth at bedtime. 09/30/14  Yes Alyssa A Lincoln Brigham, MD  budesonide-formoterol (SYMBICORT) 160-4.5 MCG/ACT inhaler Inhale 2 puffs into the lungs  2 (two) times daily.     Yes Historical Provider, MD  dexlansoprazole (DEXILANT) 60 MG capsule Take 60 mg by mouth every morning.    Yes Historical Provider, MD  diltiazem (CARDIZEM CD) 120 MG 24 hr capsule Take 1 capsule (120 mg total) by mouth daily. 03/23/15  Yes Smiley Houseman, MD  flecainide (TAMBOCOR) 100 MG tablet Take 1 tablet (100 mg total) by mouth every 12 (twelve) hours. 04/24/14  Yes Evans Lance, MD  sertraline (ZOLOFT) 25 MG tablet Take 1 tablet (25 mg total) by mouth daily. 09/30/14  Yes Alyssa A Lincoln Brigham, MD  traZODone (DESYREL) 100 MG tablet Take 100 mg by  mouth at bedtime as needed for sleep.   Yes Historical Provider, MD  XARELTO 20 MG TABS tablet Take 20 mg by mouth daily after supper. 02/28/15  Yes Historical Provider, MD  senna-docusate (SENOKOT-S) 8.6-50 MG per tablet Take 1 tablet by mouth at bedtime as needed for mild constipation. 09/30/14   Alyssa A Haney, MD   BP 132/75 mmHg  Pulse 141  Temp(Src) 97.7 F (36.5 C) (Oral)  Resp 12  Ht 5\' 5"  (1.651 m)  Wt 251 lb (113.853 kg)  BMI 41.77 kg/m2  SpO2 95% Physical Exam  Constitutional: She is oriented to person, place, and time. She appears well-developed and well-nourished.  HENT:  Head: Normocephalic and atraumatic.  Eyes: Conjunctivae and EOM are normal. Pupils are equal, round, and reactive to light.  Neck: Normal range of motion. Neck supple.  Cardiovascular: Regular rhythm.  Exam reveals no gallop and no friction rub.   No murmur heard. tachycardic  Pulmonary/Chest: Effort normal and breath sounds normal. No respiratory distress. She has no wheezes. She has no rales. She exhibits no tenderness.  CTAB  Abdominal: Soft. Bowel sounds are normal. She exhibits no distension and no mass. There is no tenderness. There is no rebound and no guarding.  Musculoskeletal: Normal range of motion. She exhibits no edema or tenderness.  Neurological: She is alert and oriented to person, place, and time.  Skin: Skin is warm and dry.  Psychiatric: She has a normal mood and affect. Her behavior is normal. Judgment and thought content normal.  Nursing note and vitals reviewed.   ED Course  Procedures (including critical care time) Labs Review Labs Reviewed  BASIC METABOLIC PANEL - Abnormal; Notable for the following:    Potassium 2.9 (*)    Glucose, Bld 114 (*)    All other components within normal limits  CBC - Abnormal; Notable for the following:    RDW 16.6 (*)    Platelets 419 (*)    All other components within normal limits  I-STAT TROPOININ, ED    Imaging Review Dg Chest 2  View  04/01/2015   CLINICAL DATA:  Shortness of breath and chest pressure  EXAM: CHEST  2 VIEW  COMPARISON:  Chest radiograph March 22, 2015 and chest CT March 22, 2015  FINDINGS: There is no edema or consolidation. There is stable mild elevation of the right hemidiaphragm. Heart size and pulmonary vascularity are normal. No adenopathy. There is degenerative change in the thoracic spine.  IMPRESSION: No edema or consolidation.   Electronically Signed   By: Lowella Grip III M.D.   On: 04/01/2015 22:00   I have personally reviewed and evaluated these images and lab results as part of my medical decision-making.   EKG Interpretation   Date/Time:  Wednesday April 01 2015 21:06:14 EDT Ventricular Rate:  139 PR Interval:  QRS Duration: 86 QT Interval:  329 QTC Calculation: 500 R Axis:   -24 Text Interpretation:  sinus tachcyardia with 1st degree AV block  ST  elevation in aVR with diffuse ST depression  Confirmed by Wilson Singer  MD,  STEPHEN ([id number]) on 04/01/2015 9:22:30 PM       MDM   Final diagnoses:  Chest pain, unspecified chest pain type    Patient with history of paroxysmal A. fib and atrial flutter. She presents today with the same, and states that she had some chest pressure. This been ongoing for the past 12 hours. Her troponin is negative. She had some wheezing in her lower lobes, but this resolved with an albuterol treatment. After allowing for the albuterol to wear off, her heart rate has trended down.  Her potassium is noted to be 2.9, will supplement this, but could be partial transient hypokalemia secondary to albuterol. Patient was seen by her cardiologist yesterday with similar symptoms. Patient was advised to undergo ablation. She is in the process of scheduling this. She acknowledges the medications will not ultimately fix her problem, and that she needs to undergo the procedure. At this time, I do not see an indication for admission to the hospital.  Patient  discussed in detail with Dr. Wilson Singer, who recommends discharge.  Patient advised to continue her medications and to follow-up with Dr. Lovena Le for ablation.  Return precautions given.    Montine Circle, PA-C 04/01/15 4801  Virgel Manifold, MD 04/02/15 [phone]

## 2015-04-01 NOTE — ED Notes (Signed)
Per EMS pt began having central chest pain 12 hours ago which is not radiating.  Denies N/V/D but is slightly SOB (wheezing in lower lobes) w/ hx of asthma.  Was given 5 of albuterol in route and 324 of ASA.  She was also given 1 nitro w/ no change in pain but a drop in blood pressure.  She was here for the same symptoms 1 week ago.

## 2015-04-01 NOTE — Discharge Instructions (Signed)
Nonspecific Chest Pain  °Chest pain can be caused by many different conditions. There is always a chance that your pain could be related to something serious, such as a heart attack or a blood clot in your lungs. Chest pain can also be caused by conditions that are not life-threatening. If you have chest pain, it is very important to follow up with your health care provider. °CAUSES  °Chest pain can be caused by: °· Heartburn. °· Pneumonia or bronchitis. °· Anxiety or stress. °· Inflammation around your heart (pericarditis) or lung (pleuritis or pleurisy). °· A blood clot in your lung. °· A collapsed lung (pneumothorax). It can develop suddenly on its own (spontaneous pneumothorax) or from trauma to the chest. °· Shingles infection (varicella-zoster virus). °· Heart attack. °· Damage to the bones, muscles, and cartilage that make up your chest wall. This can include: °¨ Bruised bones due to injury. °¨ Strained muscles or cartilage due to frequent or repeated coughing or overwork. °¨ Fracture to one or more ribs. °¨ Sore cartilage due to inflammation (costochondritis). °RISK FACTORS  °Risk factors for chest pain may include: °· Activities that increase your risk for trauma or injury to your chest. °· Respiratory infections or conditions that cause frequent coughing. °· Medical conditions or overeating that can cause heartburn. °· Heart disease or family history of heart disease. °· Conditions or health behaviors that increase your risk of developing a blood clot. °· Having had chicken pox (varicella zoster). °SIGNS AND SYMPTOMS °Chest pain can feel like: °· Burning or tingling on the surface of your chest or deep in your chest. °· Crushing, pressure, aching, or squeezing pain. °· Dull or sharp pain that is worse when you move, cough, or take a deep breath. °· Pain that is also felt in your back, neck, shoulder, or arm, or pain that spreads to any of these areas. °Your chest pain may come and go, or it may stay  constant. °DIAGNOSIS °Lab tests or other studies may be needed to find the cause of your pain. Your health care provider may have you take a test called an ambulatory ECG (electrocardiogram). An ECG records your heartbeat patterns at the time the test is performed. You may also have other tests, such as: °· Transthoracic echocardiogram (TTE). During echocardiography, sound waves are used to create a picture of all of the heart structures and to look at how blood flows through your heart. °· Transesophageal echocardiogram (TEE). This is a more advanced imaging test that obtains images from inside your body. It allows your health care provider to see your heart in finer detail. °· Cardiac monitoring. This allows your health care provider to monitor your heart rate and rhythm in real time. °· Holter monitor. This is a portable device that records your heartbeat and can help to diagnose abnormal heartbeats. It allows your health care provider to track your heart activity for several days, if needed. °· Stress tests. These can be done through exercise or by taking medicine that makes your heart beat more quickly. °· Blood tests. °· Imaging tests. °TREATMENT  °Your treatment depends on what is causing your chest pain. Treatment may include: °· Medicines. These may include: °¨ Acid blockers for heartburn. °¨ Anti-inflammatory medicine. °¨ Pain medicine for inflammatory conditions. °¨ Antibiotic medicine, if an infection is present. °¨ Medicines to dissolve blood clots. °¨ Medicines to treat coronary artery disease. °· Supportive care for conditions that do not require medicines. This may include: °¨ Resting. °¨ Applying heat   or cold packs to injured areas. °¨ Limiting activities until pain decreases. °HOME CARE INSTRUCTIONS °· If you were prescribed an antibiotic medicine, finish it all even if you start to feel better. °· Avoid any activities that bring on chest pain. °· Do not use any tobacco products, including  cigarettes, chewing tobacco, or electronic cigarettes. If you need help quitting, ask your health care provider. °· Do not drink alcohol. °· Take medicines only as directed by your health care provider. °· Keep all follow-up visits as directed by your health care provider. This is important. This includes any further testing if your chest pain does not go away. °· If heartburn is the cause for your chest pain, you may be told to keep your head raised (elevated) while sleeping. This reduces the chance that acid will go from your stomach into your esophagus. °· Make lifestyle changes as directed by your health care provider. These may include: °¨ Getting regular exercise. Ask your health care provider to suggest some activities that are safe for you. °¨ Eating a heart-healthy diet. A registered dietitian can help you to learn healthy eating options. °¨ Maintaining a healthy weight. °¨ Managing diabetes, if necessary. °¨ Reducing stress. °SEEK MEDICAL CARE IF: °· Your chest pain does not go away after treatment. °· You have a rash with blisters on your chest. °· You have a fever. °SEEK IMMEDIATE MEDICAL CARE IF:  °· Your chest pain is worse. °· You have an increasing cough, or you cough up blood. °· You have severe abdominal pain. °· You have severe weakness. °· You faint. °· You have chills. °· You have sudden, unexplained chest discomfort. °· You have sudden, unexplained discomfort in your arms, back, neck, or jaw. °· You have shortness of breath at any time. °· You suddenly start to sweat, or your skin gets clammy. °· You feel nauseous or you vomit. °· You suddenly feel light-headed or dizzy. °· Your heart begins to beat quickly, or it feels like it is skipping beats. °These symptoms may represent a serious problem that is an emergency. Do not wait to see if the symptoms will go away. Get medical help right away. Call your local emergency services (911 in the [place] Do not drive yourself to the hospital. °  °This  information is not intended to replace advice given to you by your health care provider. Make sure you discuss any questions you have with your health care provider. °  °Document Released: 03/23/2005 Document Revised: 07/04/2014 Document Reviewed: 01/17/2014 °Elsevier Interactive Patient Education ©2016 [employer] ° °

## 2015-04-02 ENCOUNTER — Inpatient Hospital Stay: Payer: Self-pay | Admitting: Internal Medicine

## 2015-04-04 ENCOUNTER — Emergency Department (HOSPITAL_COMMUNITY)
Admission: EM | Admit: 2015-04-04 | Discharge: 2015-04-04 | Disposition: A | Discharge status: Home / Self Care | Payer: Medicare PPO | Attending: Emergency Medicine | Admitting: Emergency Medicine

## 2015-04-04 DIAGNOSIS — F329 Major depressive disorder, single episode, unspecified: Secondary | ICD-10-CM | POA: Insufficient documentation

## 2015-04-04 DIAGNOSIS — R0600 Dyspnea, unspecified: Secondary | ICD-10-CM

## 2015-04-04 DIAGNOSIS — G8929 Other chronic pain: Secondary | ICD-10-CM | POA: Diagnosis not present

## 2015-04-04 DIAGNOSIS — Z8679 Personal history of other diseases of the circulatory system: Secondary | ICD-10-CM | POA: Diagnosis not present

## 2015-04-04 DIAGNOSIS — K219 Gastro-esophageal reflux disease without esophagitis: Secondary | ICD-10-CM | POA: Diagnosis not present

## 2015-04-04 DIAGNOSIS — Z79899 Other long term (current) drug therapy: Secondary | ICD-10-CM | POA: Insufficient documentation

## 2015-04-04 DIAGNOSIS — Z8639 Personal history of other endocrine, nutritional and metabolic disease: Secondary | ICD-10-CM | POA: Diagnosis not present

## 2015-04-04 DIAGNOSIS — J45901 Unspecified asthma with (acute) exacerbation: Secondary | ICD-10-CM | POA: Diagnosis not present

## 2015-04-04 DIAGNOSIS — Z8739 Personal history of other diseases of the musculoskeletal system and connective tissue: Secondary | ICD-10-CM | POA: Diagnosis not present

## 2015-04-04 DIAGNOSIS — F419 Anxiety disorder, unspecified: Secondary | ICD-10-CM | POA: Insufficient documentation

## 2015-04-04 DIAGNOSIS — Z7901 Long term (current) use of anticoagulants: Secondary | ICD-10-CM | POA: Insufficient documentation

## 2015-04-04 DIAGNOSIS — I10 Essential (primary) hypertension: Secondary | ICD-10-CM | POA: Insufficient documentation

## 2015-04-04 DIAGNOSIS — Z87891 Personal history of nicotine dependence: Secondary | ICD-10-CM | POA: Insufficient documentation

## 2015-04-04 DIAGNOSIS — Z7952 Long term (current) use of systemic steroids: Secondary | ICD-10-CM | POA: Diagnosis not present

## 2015-04-04 DIAGNOSIS — J45909 Unspecified asthma, uncomplicated: Secondary | ICD-10-CM | POA: Diagnosis present

## 2015-04-04 MED ORDER — ALBUTEROL SULFATE HFA 108 (90 BASE) MCG/ACT IN AERS
1.0000 | INHALATION_SPRAY | Freq: Once | RESPIRATORY_TRACT | Status: AC
  Administered 2015-04-04: 1 via RESPIRATORY_TRACT
  Filled 2015-04-04: qty 6.7

## 2015-04-04 MED ORDER — LORAZEPAM 1 MG PO TABS
1.0000 mg | ORAL_TABLET | Freq: Once | ORAL | Status: AC
  Administered 2015-04-04: 1 mg via ORAL
  Filled 2015-04-04: qty 1

## 2015-04-04 NOTE — ED Notes (Signed)
Bed: WA02 Expected date:  Expected time:  Means of arrival:  Comments: asthma

## 2015-04-04 NOTE — ED Notes (Signed)
Per [facility] pt reported sob , yet pt was O2 sat was 100 RA. Pt used her inhaler without relief per pt. Pt received 5 mg albuterol and 0.5 mg Atrovent . No wheezing noticed upon assessment. Pt alert and oriented x 4, no resp distress at this time.

## 2015-04-04 NOTE — Discharge Instructions (Signed)

## 2015-04-04 NOTE — ED Notes (Signed)
Forced stridor per EMS

## 2015-04-04 NOTE — ED Provider Notes (Signed)
CSN: [id number]     Arrival date & time 04/04/15  1509 History   First MD Initiated Contact with Patient 04/04/15 1520     Chief Complaint  Patient presents with  . Asthma     (Consider location/radiation/quality/duration/timing/severity/associated sxs/prior Treatment) HPI   59 year old female with dyspnea. Onset today. Per EMS, patient had stridor which abruptly waxed/08. Initial O2 saturation was 100% on room air. They did not appreciate any wheezing. She was given 5 mg albuterol and 0.5 mg Atrovent prior to arrival. She denies any acute pain to be. Occasional nonproductive cough. No fevers or chills. No unusual pain or swelling.    Past Medical History  Diagnosis Date  . Asthma   . Hypertension   . GERD (gastroesophageal reflux disease)   . Anxiety   . Depression   . Family history of anesthesia complication     "daughter; causes her to pass out afterwards"  . Pulmonary embolism ([place]) 12/28/2013    "2 clots in each lung"  . Hyperthyroidism   . Migraine     "monthly" (12/28/2013)  . Osteoarthritis     "both knees; back of my neck; right pelvic bone" (12/28/2013)  . Chronic lower back pain   . Paroxysmal a-fib [facility])    Past Surgical History  Procedure Laterality Date  . Abdominal hysterectomy    . Wisdom tooth extraction    . Thyroidectomy, partial Right 2005  . Nasal sinus surgery  2007  . Appendectomy    . Excisional hemorrhoidectomy    . Dilation and curettage of uterus    . Tubal ligation    . Breast cyst excision Right    Family History  Problem Relation Age of Onset  . Osteoarthritis Mother   . Asthma Mother   . Heart failure Mother    Social History  Substance Use Topics  . Smoking status: Former Smoker -- 0.25 packs/day for 20 years    Types: Cigarettes    Quit date: 07/17/1999  . Smokeless tobacco: Never Used  . Alcohol Use: Yes     Comment: "drank some in my 30's"   OB History    No data available     Review of Systems  All systems reviewed and  negative, other than as noted in HPI.   Allergies  Caffeine; Vicodin; Ciprofloxacin; Erythromycin; Lisinopril; Oxycodone; and Sulfamethoxazole-trimethoprim  Home Medications   Prior to Admission medications   Medication Sig Start Date End Date Taking? Authorizing Provider  albuterol (PROVENTIL HFA;VENTOLIN HFA) 108 (90 BASE) MCG/ACT inhaler Inhale 2 puffs into the lungs every 6 (six) hours as needed. For shortness of breath   Yes Historical Provider, MD  albuterol (PROVENTIL) (2.5 MG/3ML) 0.083% nebulizer solution Take 3 mLs (2.5 mg total) by nebulization every 6 (six) hours as needed. For shortness of breath 04/07/13  Yes Hilton Sinclair, MD  ARIPiprazole (ABILIFY) 5 MG tablet Take 1 tablet (5 mg total) by mouth at bedtime. 09/30/14  Yes Alyssa A Lincoln Brigham, MD  budesonide-formoterol (SYMBICORT) 160-4.5 MCG/ACT inhaler Inhale 2 puffs into the lungs 2 (two) times daily.     Yes Historical Provider, MD  dexlansoprazole (DEXILANT) 60 MG capsule Take 60 mg by mouth every morning.    Yes Historical Provider, MD  diltiazem (CARDIZEM CD) 120 MG 24 hr capsule Take 1 capsule (120 mg total) by mouth daily. 03/23/15  Yes Smiley Houseman, MD  flecainide (TAMBOCOR) 100 MG tablet Take 1 tablet (100 mg total) by mouth every 12 (twelve) hours. 04/24/14  Yes Evans Lance, MD  senna-docusate (SENOKOT-S) 8.6-50 MG per tablet Take 1 tablet by mouth at bedtime as needed for mild constipation. 09/30/14  Yes Alyssa A Lincoln Brigham, MD  sertraline (ZOLOFT) 25 MG tablet Take 1 tablet (25 mg total) by mouth daily. 09/30/14  Yes Alyssa A Lincoln Brigham, MD  traZODone (DESYREL) 100 MG tablet Take 100 mg by mouth at bedtime as needed for sleep.   Yes Historical Provider, MD  XARELTO 20 MG TABS tablet Take 20 mg by mouth daily after supper. 02/28/15  Yes Historical Provider, MD   BP 124/56 mmHg  Pulse 76  Temp(Src) 97.7 F (36.5 C) (Oral)  Resp 18  SpO2 100% Physical Exam  Constitutional: She appears well-developed and well-nourished.  No distress.  HENT:  Head: Normocephalic and atraumatic.  Eyes: Conjunctivae are normal. Right eye exhibits no discharge. Left eye exhibits no discharge.  Neck: Neck supple.  Cardiovascular: Normal rate, regular rhythm and normal heart sounds.  Exam reveals no gallop and no friction rub.   No murmur heard. Pulmonary/Chest:  Stridor which only began when asked to point to auscultate patient's lungs. Also hoarse sounding voice which started around the same tim. Patient was able to provide a history just previous to this with normal sounding voice.  Abdominal: Soft. She exhibits no distension. There is no tenderness.  Musculoskeletal: She exhibits no edema or tenderness.  Neurological: She is alert.  Skin: Skin is warm and dry.  Psychiatric: She has a normal mood and affect. Her behavior is normal. Thought content normal.  Nursing note and vitals reviewed.   ED Course  Procedures (including critical care time) Labs Review Labs Reviewed - No data to display  Imaging Review No results found. I have personally reviewed and evaluated these images and lab results as part of my medical decision-making.   EKG Interpretation   Date/Time:  Saturday April 04 2015 15:24:15 EDT Ventricular Rate:  98 PR Interval:  196 QRS Duration: 91 QT Interval:  378 QTC Calculation: 483 R Axis:   45 Text Interpretation:  Sinus rhythm Baseline wander in lead(s) I aVR aVL  Confirmed by Wilson Singer  MD, Avyn Aden ([id number]) on 04/04/2015 4:00:17 PM      MDM   Final diagnoses:  Dyspnea    59yF with dyspnea. Pt with audible stridorous breathing which only began when I began to auscultate her lungs. This came/went frequently with rapid onset and resolution during ED stay. Most consistent with psychogenic process rather than physiologic. Lungs themselves sound clear. o2 sats normal on RA. Does have hx of asthma and atrial flutter. Very low suspicion for emergent cause of her symptoms today. She is currently out of  her albuterol MDI. One was provided to her in ED today. I do not feel further testing is indicated. Reassurance provided. Return precautions discussed.   Virgel Manifold, MD 04/09/15 1114

## 2015-04-06 ENCOUNTER — Other Ambulatory Visit: Payer: Self-pay

## 2015-04-06 DIAGNOSIS — I471 Supraventricular tachycardia: Secondary | ICD-10-CM

## 2015-04-06 MED ORDER — DILTIAZEM HCL ER COATED BEADS 120 MG PO CP24: 120.0000 mg | ORAL_CAPSULE | Freq: Every day | ORAL | Status: DC

## 2015-04-06 MED ORDER — FLECAINIDE ACETATE 100 MG PO TABS: 100.0000 mg | ORAL_TABLET | Freq: Two times a day (BID) | ORAL | Status: DC

## 2015-04-27 ENCOUNTER — Telehealth: Payer: Self-pay | Admitting: Internal Medicine

## 2015-04-27 DIAGNOSIS — I4892 Unspecified atrial flutter: Secondary | ICD-10-CM

## 2015-04-27 NOTE — Telephone Encounter (Signed)
New Prob   Pt calling to set up her ablation. Please call.

## 2015-04-28 NOTE — Telephone Encounter (Signed)
I have her scheduled for 05/27/15 at 8:30am.  She will come in for labs on 05/19/15 at 3pm.  She usually gets transportation from [facility] and does not know how early they are able to pick up.  She is going to call them and call me back

## 2015-04-28 NOTE — Telephone Encounter (Signed)
Spoke with patient and gave he more dates.  She is going to check with her cousin and get back with me about a date

## 2015-04-28 NOTE — Telephone Encounter (Signed)
lmom for patient to return my call to schedule ablation

## 2015-04-28 NOTE — Telephone Encounter (Signed)
Follow up   Pt wants to give you date to schedule ablation 11-30

## 2015-05-01 ENCOUNTER — Encounter: Payer: Self-pay | Admitting: *Deleted

## 2015-05-11 ENCOUNTER — Telehealth: Payer: Self-pay | Admitting: Internal Medicine

## 2015-05-11 NOTE — Telephone Encounter (Signed)
Spoke with the patient and she is aware that there is a pre cert team that will handle her procedure.  She says she does not want to reschedule at this time but will call back if something changes

## 2015-05-11 NOTE — Telephone Encounter (Signed)
Follow up     FYI Pt is calling wanting to let the nurse know that her procedure does require a pre cert.  She is still needing to reschedule her procedure

## 2015-05-11 NOTE — Telephone Encounter (Signed)
New message     Talk to the nurse about rescheduling her procedure

## 2015-05-12 ENCOUNTER — Telehealth: Payer: Self-pay | Admitting: Internal Medicine

## 2015-05-12 NOTE — Telephone Encounter (Signed)
Pt aware [name] White is not in the office today however I will forward this information to her and she will call her back to reschedule.

## 2015-05-12 NOTE — Telephone Encounter (Signed)
New Message    Pt calling stating she wants to speak to [facility] about scheduling her procedure on 05/27/15 for a later time than what it is currently scheduled for. Please call back and advise.

## 2015-05-14 NOTE — Telephone Encounter (Signed)
Left patient a message that I have moved her procedure time to 10am  She will need to be at the hospital at 8:00am

## 2015-05-15 ENCOUNTER — Telehealth: Payer: Self-pay | Admitting: Internal Medicine

## 2015-05-15 NOTE — Telephone Encounter (Signed)
New Message  Pt wanted to inform RN that the 8:00 am procedure time for 11/30 was fine with her. Please advise

## 2015-05-19 ENCOUNTER — Other Ambulatory Visit (INDEPENDENT_AMBULATORY_CARE_PROVIDER_SITE_OTHER): Payer: Medicare PPO

## 2015-05-19 ENCOUNTER — Telehealth: Payer: Self-pay | Admitting: Internal Medicine

## 2015-05-19 DIAGNOSIS — I4892 Unspecified atrial flutter: Secondary | ICD-10-CM | POA: Diagnosis not present

## 2015-05-19 LAB — BASIC METABOLIC PANEL
BUN: 11 mg/dL (ref 7–25)
CALCIUM: 9.1 mg/dL (ref 8.6–10.4)
CO2: 28 mmol/L (ref 20–31)
CREATININE: 0.59 mg/dL (ref 0.50–0.99)
Chloride: 102 mmol/L (ref 98–110)
GLUCOSE: 97 mg/dL (ref 65–99)
Potassium: 2.9 mmol/L — ABNORMAL LOW (ref 3.5–5.3)
Sodium: 139 mmol/L (ref 135–146)

## 2015-05-19 LAB — CBC WITH DIFFERENTIAL/PLATELET
Basophils Absolute: 0 10*3/uL (ref 0.0–0.1)
Basophils Relative: 0 % (ref 0–1)
Eosinophils Absolute: 0.2 10*3/uL (ref 0.0–0.7)
Eosinophils Relative: 3 % (ref 0–5)
HEMATOCRIT: 39.9 % (ref 36.0–46.0)
HEMOGLOBIN: 13 g/dL (ref 12.0–15.0)
LYMPHS ABS: 1.8 10*3/uL (ref 0.7–4.0)
LYMPHS PCT: 24 % (ref 12–46)
MCH: 28.3 pg (ref 26.0–34.0)
MCHC: 32.6 g/dL (ref 30.0–36.0)
MCV: 86.9 fL (ref 78.0–100.0)
MONO ABS: 0.4 10*3/uL (ref 0.1–1.0)
MONOS PCT: 6 % (ref 3–12)
MPV: 8.7 fL (ref 8.6–12.4)
NEUTROS PCT: 67 % (ref 43–77)
Neutro Abs: 4.9 10*3/uL (ref 1.7–7.7)
Platelets: 403 10*3/uL — ABNORMAL HIGH (ref 150–400)
RBC: 4.59 MIL/uL (ref 3.87–5.11)
RDW: 17 % — ABNORMAL HIGH (ref 11.5–15.5)
WBC: 7.3 10*3/uL (ref 4.0–10.5)

## 2015-05-19 NOTE — Telephone Encounter (Signed)
Spoke with patient and verified time for procedure.

## 2015-05-19 NOTE — Telephone Encounter (Signed)
New message    Patient came in for blood work today  - calling for pre-op instruction .

## 2015-05-26 ENCOUNTER — Telehealth: Payer: Self-pay | Admitting: *Deleted

## 2015-05-26 ENCOUNTER — Other Ambulatory Visit: Payer: Self-pay | Admitting: *Deleted

## 2015-05-26 DIAGNOSIS — E876 Hypokalemia: Secondary | ICD-10-CM

## 2015-05-26 MED ORDER — POTASSIUM CHLORIDE CRYS ER 20 MEQ PO TBCR: 40.0000 meq | EXTENDED_RELEASE_TABLET | Freq: Every day | ORAL | Status: DC

## 2015-05-26 MED ORDER — POTASSIUM CHLORIDE CRYS ER 10 MEQ PO TBCR: 40.0000 meq | EXTENDED_RELEASE_TABLET | Freq: Once | ORAL | Status: DC

## 2015-05-26 NOTE — Telephone Encounter (Signed)
Called and spoke with patient in regards to her Potassium.  Let her know that Dr Lovena Le would like for her to start Potassium 50meq daily.  She is aware but says she will not be able to pick up tonight and take before procedure tomorrow. Dr Lovena Le aware and will give her Potassium 40 meq po upon arrival

## 2015-05-27 ENCOUNTER — Encounter (HOSPITAL_COMMUNITY): Payer: Self-pay | Admitting: Internal Medicine

## 2015-05-27 ENCOUNTER — Ambulatory Visit (HOSPITAL_COMMUNITY)
Admission: RE | Admit: 2015-05-27 | Discharge: 2015-05-28 | Disposition: A | Discharge status: Home / Self Care | Payer: Medicare PPO | Source: Ambulatory Visit | Attending: Internal Medicine | Admitting: Internal Medicine

## 2015-05-27 ENCOUNTER — Encounter (HOSPITAL_COMMUNITY)
Admission: RE | Disposition: A | Discharge status: Home / Self Care | Payer: Medicare PPO | Source: Ambulatory Visit | Attending: Internal Medicine

## 2015-05-27 DIAGNOSIS — I10 Essential (primary) hypertension: Secondary | ICD-10-CM | POA: Insufficient documentation

## 2015-05-27 DIAGNOSIS — E876 Hypokalemia: Secondary | ICD-10-CM

## 2015-05-27 DIAGNOSIS — M1990 Unspecified osteoarthritis, unspecified site: Secondary | ICD-10-CM | POA: Insufficient documentation

## 2015-05-27 DIAGNOSIS — Z86711 Personal history of pulmonary embolism: Secondary | ICD-10-CM | POA: Diagnosis not present

## 2015-05-27 DIAGNOSIS — I471 Supraventricular tachycardia: Secondary | ICD-10-CM | POA: Diagnosis not present

## 2015-05-27 DIAGNOSIS — J449 Chronic obstructive pulmonary disease, unspecified: Secondary | ICD-10-CM | POA: Insufficient documentation

## 2015-05-27 DIAGNOSIS — I483 Typical atrial flutter: Secondary | ICD-10-CM

## 2015-05-27 DIAGNOSIS — F329 Major depressive disorder, single episode, unspecified: Secondary | ICD-10-CM | POA: Diagnosis not present

## 2015-05-27 DIAGNOSIS — E669 Obesity, unspecified: Secondary | ICD-10-CM | POA: Insufficient documentation

## 2015-05-27 DIAGNOSIS — K219 Gastro-esophageal reflux disease without esophagitis: Secondary | ICD-10-CM | POA: Insufficient documentation

## 2015-05-27 DIAGNOSIS — I480 Paroxysmal atrial fibrillation: Secondary | ICD-10-CM | POA: Insufficient documentation

## 2015-05-27 DIAGNOSIS — Z7901 Long term (current) use of anticoagulants: Secondary | ICD-10-CM | POA: Diagnosis not present

## 2015-05-27 DIAGNOSIS — Z6841 Body Mass Index (BMI) 40.0 and over, adult: Secondary | ICD-10-CM | POA: Diagnosis not present

## 2015-05-27 DIAGNOSIS — Z87891 Personal history of nicotine dependence: Secondary | ICD-10-CM | POA: Diagnosis not present

## 2015-05-27 DIAGNOSIS — Z79899 Other long term (current) drug therapy: Secondary | ICD-10-CM | POA: Diagnosis not present

## 2015-05-27 DIAGNOSIS — I4892 Unspecified atrial flutter: Secondary | ICD-10-CM | POA: Diagnosis present

## 2015-05-27 HISTORY — PX: ELECTROPHYSIOLOGIC STUDY: SHX172A

## 2015-05-27 LAB — POTASSIUM: Potassium: 3 mmol/L — ABNORMAL LOW (ref 3.5–5.1)

## 2015-05-27 SURGERY — A-FLUTTER/A-TACH/SVT ABLATION

## 2015-05-27 MED ORDER — ARIPIPRAZOLE 5 MG PO TABS
5.0000 mg | ORAL_TABLET | Freq: Every day | ORAL | Status: DC
  Administered 2015-05-27: 5 mg via ORAL
  Filled 2015-05-27 (×2): qty 1

## 2015-05-27 MED ORDER — FENTANYL CITRATE (PF) 100 MCG/2ML IJ SOLN
INTRAMUSCULAR | Status: AC
  Filled 2015-05-27: qty 2

## 2015-05-27 MED ORDER — POTASSIUM CHLORIDE CRYS ER 20 MEQ PO TBCR
40.0000 meq | EXTENDED_RELEASE_TABLET | Freq: Once | ORAL | Status: AC
  Administered 2015-05-27: 40 meq via ORAL
  Filled 2015-05-27: qty 2

## 2015-05-27 MED ORDER — SODIUM CHLORIDE 0.9 % IJ SOLN: 3.0000 mL | INTRAMUSCULAR | Status: DC | PRN

## 2015-05-27 MED ORDER — HEPARIN (PORCINE) IN NACL 2-0.9 UNIT/ML-% IJ SOLN
INTRAMUSCULAR | Status: AC
  Filled 2015-05-27: qty 500

## 2015-05-27 MED ORDER — SODIUM CHLORIDE 0.9 % IJ SOLN
3.0000 mL | Freq: Two times a day (BID) | INTRAMUSCULAR | Status: DC
  Administered 2015-05-27 – 2015-05-28 (×3): 3 mL via INTRAVENOUS

## 2015-05-27 MED ORDER — ISOPROTERENOL HCL 0.2 MG/ML IJ SOLN
INTRAMUSCULAR | Status: AC
  Filled 2015-05-27: qty 5

## 2015-05-27 MED ORDER — ALBUTEROL SULFATE (2.5 MG/3ML) 0.083% IN NEBU: 2.5000 mg | INHALATION_SOLUTION | Freq: Four times a day (QID) | RESPIRATORY_TRACT | Status: DC | PRN

## 2015-05-27 MED ORDER — TRAZODONE HCL 100 MG PO TABS: 100.0000 mg | ORAL_TABLET | Freq: Every evening | ORAL | Status: DC | PRN

## 2015-05-27 MED ORDER — HEPARIN (PORCINE) IN NACL 2-0.9 UNIT/ML-% IJ SOLN
INTRAMUSCULAR | Status: AC
  Filled 2015-05-27: qty 1000

## 2015-05-27 MED ORDER — SODIUM CHLORIDE 0.9 % IV SOLN
INTRAVENOUS | Status: DC
  Administered 2015-05-27: 09:00:00 via INTRAVENOUS

## 2015-05-27 MED ORDER — MIDAZOLAM HCL 5 MG/5ML IJ SOLN
INTRAMUSCULAR | Status: AC
  Filled 2015-05-27: qty 5

## 2015-05-27 MED ORDER — POTASSIUM CHLORIDE CRYS ER 20 MEQ PO TBCR: 40.0000 meq | EXTENDED_RELEASE_TABLET | Freq: Once | ORAL | Status: DC

## 2015-05-27 MED ORDER — SENNOSIDES-DOCUSATE SODIUM 8.6-50 MG PO TABS: 1.0000 | ORAL_TABLET | Freq: Every evening | ORAL | Status: DC | PRN

## 2015-05-27 MED ORDER — POTASSIUM CHLORIDE CRYS ER 20 MEQ PO TBCR
EXTENDED_RELEASE_TABLET | ORAL | Status: AC
  Administered 2015-05-27: 40 meq via ORAL
  Filled 2015-05-27: qty 2

## 2015-05-27 MED ORDER — ALBUTEROL SULFATE HFA 108 (90 BASE) MCG/ACT IN AERS: 2.0000 | INHALATION_SPRAY | Freq: Four times a day (QID) | RESPIRATORY_TRACT | Status: DC | PRN

## 2015-05-27 MED ORDER — HEPARIN (PORCINE) IN NACL 2-0.9 UNIT/ML-% IJ SOLN
INTRAMUSCULAR | Status: DC | PRN
  Administered 2015-05-27: 10:00:00

## 2015-05-27 MED ORDER — SODIUM CHLORIDE 0.9 % IV SOLN
1.0000 mg | INTRAVENOUS | Status: DC | PRN
Start: 2015-05-27 — End: 2015-05-27
  Administered 2015-05-27: 1 ug/min via INTRAVENOUS

## 2015-05-27 MED ORDER — SERTRALINE HCL 50 MG PO TABS
25.0000 mg | ORAL_TABLET | Freq: Every day | ORAL | Status: DC
  Administered 2015-05-27 – 2015-05-28 (×2): 25 mg via ORAL
  Filled 2015-05-27 (×2): qty 1

## 2015-05-27 MED ORDER — BUDESONIDE-FORMOTEROL FUMARATE 160-4.5 MCG/ACT IN AERO
2.0000 | INHALATION_SPRAY | Freq: Two times a day (BID) | RESPIRATORY_TRACT | Status: DC
  Administered 2015-05-27 – 2015-05-28 (×2): 2 via RESPIRATORY_TRACT
  Filled 2015-05-27: qty 6

## 2015-05-27 MED ORDER — ONDANSETRON HCL 4 MG/2ML IJ SOLN: 4.0000 mg | Freq: Four times a day (QID) | INTRAMUSCULAR | Status: DC | PRN

## 2015-05-27 MED ORDER — ACETAMINOPHEN 325 MG PO TABS: 650.0000 mg | ORAL_TABLET | ORAL | Status: DC | PRN

## 2015-05-27 MED ORDER — FENTANYL CITRATE (PF) 100 MCG/2ML IJ SOLN
INTRAMUSCULAR | Status: DC | PRN
  Administered 2015-05-27 (×4): 12.5 ug via INTRAVENOUS
  Administered 2015-05-27 (×4): 25 ug via INTRAVENOUS
  Administered 2015-05-27: 12.5 ug via INTRAVENOUS
  Administered 2015-05-27 (×2): 25 ug via INTRAVENOUS
  Administered 2015-05-27 (×2): 12.5 ug via INTRAVENOUS
  Administered 2015-05-27: 25 ug via INTRAVENOUS
  Administered 2015-05-27: 12.5 ug via INTRAVENOUS

## 2015-05-27 MED ORDER — PANTOPRAZOLE SODIUM 40 MG PO TBEC
40.0000 mg | DELAYED_RELEASE_TABLET | Freq: Every day | ORAL | Status: DC
  Administered 2015-05-27: 40 mg via ORAL
  Filled 2015-05-27: qty 1

## 2015-05-27 MED ORDER — MIDAZOLAM HCL 5 MG/5ML IJ SOLN
INTRAMUSCULAR | Status: DC | PRN
  Administered 2015-05-27 (×8): 1 mg via INTRAVENOUS
  Administered 2015-05-27: 2 mg via INTRAVENOUS
  Administered 2015-05-27: 1 mg via INTRAVENOUS
  Administered 2015-05-27 (×2): 2 mg via INTRAVENOUS
  Administered 2015-05-27: 1 mg via INTRAVENOUS
  Administered 2015-05-27: 2 mg via INTRAVENOUS
  Administered 2015-05-27 (×2): 1 mg via INTRAVENOUS

## 2015-05-27 MED ORDER — SODIUM CHLORIDE 0.9 % IV SOLN: 250.0000 mL | INTRAVENOUS | Status: DC | PRN

## 2015-05-27 MED ORDER — LORAZEPAM 0.5 MG PO TABS
0.5000 mg | ORAL_TABLET | Freq: Four times a day (QID) | ORAL | Status: DC | PRN
  Administered 2015-05-27 – 2015-05-28 (×2): 0.5 mg via ORAL
  Filled 2015-05-27 (×2): qty 1

## 2015-05-27 MED ORDER — BUPIVACAINE HCL (PF) 0.25 % IJ SOLN
INTRAMUSCULAR | Status: DC | PRN
  Administered 2015-05-27: 35 mL

## 2015-05-27 MED ORDER — POTASSIUM CHLORIDE CRYS ER 20 MEQ PO TBCR
40.0000 meq | EXTENDED_RELEASE_TABLET | Freq: Every day | ORAL | Status: DC
  Administered 2015-05-28: 40 meq via ORAL
  Filled 2015-05-27: qty 2

## 2015-05-27 MED ORDER — DILTIAZEM HCL ER COATED BEADS 120 MG PO CP24
120.0000 mg | ORAL_CAPSULE | Freq: Every day | ORAL | Status: DC
  Administered 2015-05-27 – 2015-05-28 (×2): 120 mg via ORAL
  Filled 2015-05-27 (×2): qty 1

## 2015-05-27 MED ORDER — FLECAINIDE ACETATE 100 MG PO TABS
100.0000 mg | ORAL_TABLET | Freq: Two times a day (BID) | ORAL | Status: DC
  Administered 2015-05-27 – 2015-05-28 (×2): 100 mg via ORAL
  Filled 2015-05-27 (×4): qty 1

## 2015-05-27 MED ORDER — BUPIVACAINE HCL (PF) 0.25 % IJ SOLN
INTRAMUSCULAR | Status: AC
  Filled 2015-05-27: qty 60

## 2015-05-27 SURGICAL SUPPLY — 12 items
BAG SNAP BAND KOVER 36X36 (MISCELLANEOUS) ×2 IMPLANT
CATH CELSIUS FLTR 8MM (ABLATOR) ×2 IMPLANT
CATH DUODECA/ISMUS 7FR REPROC (CATHETERS) ×2 IMPLANT
CATH HEX JOS 2-5-2 65CM 6F REP (CATHETERS) ×2 IMPLANT
CATH JOSEPH QUAD ALLRED 6F REP (CATHETERS) ×2 IMPLANT
PACK EP LATEX FREE (CUSTOM PROCEDURE TRAY) ×3
PACK EP LF (CUSTOM PROCEDURE TRAY) IMPLANT
PAD DEFIB LIFELINK (PAD) ×2 IMPLANT
SHEATH PINNACLE 6F 10CM (SHEATH) ×4 IMPLANT
SHEATH PINNACLE 7F 10CM (SHEATH) ×2 IMPLANT
SHEATH PINNACLE 8F 10CM (SHEATH) ×2 IMPLANT
SHIELD RADPAD SCOOP 12X17 (MISCELLANEOUS) ×2 IMPLANT

## 2015-05-27 NOTE — Progress Notes (Signed)
Site area: RFV x 3/RT IJ Site Prior to Removal:  Level 0/0 Pressure Applied For:20 min Manual:  yes  Patient Status During Pull: stable  Post Pull Site:  Level 0 Post Pull Instructions Given: yes  Post Pull Pulses Present: n/a Dressing Applied: clear  Bedrest begins @ 1340 till 1940 Comments:

## 2015-05-27 NOTE — H&P (Signed)
HPI [name] returns today for followup. She is a very pleasant 59 year old woman with a history of recurrent SVT, hypertension, and COPD. She continues to have palpitations. She returns today for followup. She has lost over 50 lbs. No chest pain. She has noted that her heart has been racing some despite flecainide.  Allergies  Allergen Reactions  . Caffeine Other (See Comments)    Migraine  . Vicodin [Hydrocodone-Acetaminophen] Nausea And Vomiting and Other (See Comments)    Headache   . Ciprofloxacin Hives  . Erythromycin Hives  . Lisinopril Cough  . Oxycodone Nausea And Vomiting    headache  . Sulfamethoxazole-Trimethoprim Rash     Current Outpatient Prescriptions  Medication Sig Dispense Refill  . acetaminophen (TYLENOL) 650 MG CR tablet Take 650 mg by mouth every 8 (eight) hours as needed for pain.    Marland Kitchen albuterol (PROVENTIL HFA;VENTOLIN HFA) 108 (90 BASE) MCG/ACT inhaler Inhale 2 puffs into the lungs every 6 (six) hours as needed. For shortness of breath    . albuterol (PROVENTIL) (2.5 MG/3ML) 0.083% nebulizer solution Take 3 mLs (2.5 mg total) by nebulization every 6 (six) hours as needed. For shortness of breath 75 mL 12  . ARIPiprazole (ABILIFY) 5 MG tablet Take 1 tablet (5 mg total) by mouth at bedtime. 60 tablet 3  . azelastine (OPTIVAR) 0.05 % ophthalmic solution Place 1 drop into both eyes 2 (two) times daily as needed. For dry/irritated eyes    . budesonide-formoterol (SYMBICORT) 160-4.5 MCG/ACT inhaler Inhale 2 puffs into the lungs 2 (two) times daily.     Marland Kitchen dexlansoprazole (DEXILANT) 60 MG capsule Take 60 mg by mouth every morning.     . diclofenac sodium (VOLTAREN) 1 % GEL Apply 2 g topically 2 (two) times daily as needed (For pain or inflammation on knees.).     Marland Kitchen diltiazem (CARDIZEM CD) 120 MG 24 hr capsule Take 1 capsule (120 mg total) by mouth daily. 30 capsule 0  .  flecainide (TAMBOCOR) 100 MG tablet Take 1 tablet (100 mg total) by mouth every 12 (twelve) hours. 180 tablet 3  . Olopatadine HCl 0.6 % SOLN Place 1 puff into the nose at bedtime as needed (For seasonal allergies.).     Marland Kitchen senna-docusate (SENOKOT-S) 8.6-50 MG per tablet Take 1 tablet by mouth at bedtime as needed for mild constipation. 30 tablet 3  . sertraline (ZOLOFT) 25 MG tablet Take 1 tablet (25 mg total) by mouth daily. 60 tablet 3  . traMADol (ULTRAM) 50 MG tablet Take 50 mg by mouth every 8 (eight) hours as needed (pain).    . traZODone (DESYREL) 100 MG tablet Take 100 mg by mouth at bedtime as needed for sleep.    Alveda Reasons 20 MG TABS tablet Take 20 mg by mouth daily after supper.  1   Current Facility-Administered Medications  Medication Dose Route Frequency Provider Last Rate Last Dose  . diphenhydrAMINE (BENADRYL) capsule 50 mg 50 mg Oral Q6H PRN Boykin Nearing, MD  50 mg at 01/06/12 1458     Past Medical History  Diagnosis Date  . Asthma   . Hypertension   . GERD (gastroesophageal reflux disease)   . Anxiety   . Depression   . Family history of anesthesia complication     "daughter; causes her to pass out afterwards"  . Pulmonary embolism ([place]) 12/28/2013    "2 clots in each lung"  . Hyperthyroidism   . Migraine     "monthly" (12/28/2013)  . Osteoarthritis     "  both knees; back of my neck; right pelvic bone" (12/28/2013)  . Chronic lower back pain   . Paroxysmal a-fib ([facility])     ROS:  All systems reviewed and negative except as noted in the HPI.   Past Surgical History  Procedure Laterality Date  . Abdominal hysterectomy    . Wisdom tooth extraction    . Thyroidectomy, partial Right 2005  . Nasal sinus surgery  2007  . Appendectomy    . Excisional hemorrhoidectomy    . Dilation and curettage of uterus    . Tubal ligation     . Breast cyst excision Right      Family History  Problem Relation Age of Onset  . Osteoarthritis Mother   . Asthma Mother   . Heart failure Mother      Social History   Social History  . Marital Status: Single    Spouse Name: N/A  . Number of Children: N/A  . Years of Education: N/A   Occupational History  . Not on file.   Social History Main Topics  . Smoking status: Former Smoker -- 0.25 packs/day for 20 years    Types: Cigarettes    Quit date: 07/17/1999  . Smokeless tobacco: Never Used  . Alcohol Use: Yes     Comment: "drank some in my 30's"  . Drug Use: No  . Sexual Activity: No   Other Topics Concern  . Not on file   Social History Narrative     BP 114/82 mmHg  Pulse 132  Ht 5\' 5"  (1.651 m)  Wt 251 lb (113.853 kg)  BMI 41.77 kg/m2  Physical Exam:  Well appearing 59 yo woman, NAD HEENT: Unremarkable Neck: 7 cm JVD, no thyromegally Back: No CVA tenderness Lungs: Clear with no wheezes HEART: Regular rate rhythm, no murmurs, no rubs, no clicks Abd: soft, positive bowel sounds, no organomegally, no rebound, no guarding Ext: 2 plus pulses, no edema, no cyanosis, no clubbing Skin: No rashes no nodules Neuro: CN II through XII intact, motor grossly intact  ECG - atrial tachycardia transitioning into atrial flutter.  Assess/Plan:            Atrial flutter ([place]) - Evans Lance, MD at 03/31/2015 6:06 PM     Status: Written Related Problem: Atrial flutter ([place])   Expand All Collapse All   The patient has developed recurrent atrial flutter with an RVR. I have discussed the treatment options. She is on flecainide. She is anti-coagulated. Will schedule catheter ablation. I would anticipate she remain on flecainide and anti-coagulation after the procedure.             HYPERTENSION, BENIGN SYSTEMIC - Evans Lance, MD at 03/31/2015 6:07 PM      Status: Written Related Problem: HYPERTENSION, BENIGN SYSTEMIC   Expand All Collapse All   Her blood pressure is well controlled. No change in her medications.             Obesity - Evans Lance, MD at 03/31/2015 6:08 PM     Status: Written Related Problem: Obesity   Expand All Collapse All   She has done a nice job losing weight. She is encouraged to continue.             Sustained SVT - Evans Lance, MD at 03/31/2015 6:09 PM     Status: Written Related Problem: Sustained SVT   Expand All Collapse All   When she undergoes catheter ablation, will anticipate attempting to  induce atrial tachy as well.            EP Attending  Patient seen and examined. Since prior clinic visit, no change in the history, exam, assessment and plan. For EP study and catheter ablation of atrial flutter and SVT.  [name].D.

## 2015-05-28 DIAGNOSIS — I10 Essential (primary) hypertension: Secondary | ICD-10-CM | POA: Diagnosis not present

## 2015-05-28 DIAGNOSIS — J449 Chronic obstructive pulmonary disease, unspecified: Secondary | ICD-10-CM | POA: Diagnosis not present

## 2015-05-28 DIAGNOSIS — I483 Typical atrial flutter: Secondary | ICD-10-CM | POA: Diagnosis not present

## 2015-05-28 DIAGNOSIS — I471 Supraventricular tachycardia: Secondary | ICD-10-CM | POA: Diagnosis not present

## 2015-05-28 MED FILL — Heparin Sodium (Porcine) 2 Unit/ML in Sodium Chloride 0.9%: INTRAMUSCULAR | Qty: 500 | Status: AC

## 2015-05-28 NOTE — Progress Notes (Signed)
Pt has orders to be discharged. Discharge instructions given and pt has no additional questions at this time. Medication regimen reviewed and pt educated. Pt verbalized understanding and has no additional questions. Telemetry box removed. IV removed and site in good condition. Pt stable and waiting for transportation.   Saran Laviolette RN 

## 2015-05-28 NOTE — Discharge Instructions (Signed)
No driving for 3 days. No lifting over 5 lbs for 1 week. No sexual activity for 1 week. You may return to work in 1 week.  Keep procedure site clean & dry. If you notice increased pain, swelling, bleeding or pus, call/return!  You may shower, but no soaking baths/hot tubs/pools for 1 week.  ° ° °

## 2015-05-28 NOTE — Discharge Summary (Signed)
ELECTROPHYSIOLOGY PROCEDURE DISCHARGE SUMMARY    Patient ID: [name] White,  MRN: [id number], DOB/AGE: Dec 02, 1955 59 y.o.  Admit date: 05/27/2015 Discharge date: 05/28/2015  Primary Care Physician: Mercy Riding, MD Electrophysiologist: Lovena Le  Primary Discharge Diagnosis:  Atrial flutter status post ablation this admission  Secondary Discharge Diagnosis:  1.  COPD 2.  Hypertension 3.  GERD 4.  Arthritis 5.  Prior PE 6.  Depression  Allergies  Allergen Reactions  . Caffeine Other (See Comments)    Migraine  . Vicodin [Hydrocodone-Acetaminophen] Nausea And Vomiting and Other (See Comments)    Headache   . Ciprofloxacin Hives  . Erythromycin Hives  . Lisinopril Cough  . Oxycodone Nausea And Vomiting    headache  . Sulfamethoxazole-Trimethoprim Rash     Procedures This Admission: 1.  Electrophysiology study and radiofrequency catheter ablation on 05-27-15 by Dr Lovena Le.  This study demonstrated typical atrial flutter with successful CTI ablation. She also had atrial tach identified that was not able to be ablated. There were no early apparent complications.   Brief HPI: [name] White is a 59 y.o. female with a past medical history as outlined above.  She has documented atrial flutter. She has been appropriately anticoagulated for >4 weeks.  Risks, benefits, and alternatives to ablation were reviewed with the patient who wished to proceed.   Hospital Course:  The patient was admitted and underwent EPS/RFCA with details as outlined above. She was monitored on telemetry overnight which demonstrated sinus rhythm.  Groin and neck incisions were without complication.  They were examined by Dr Lovena Le who considered them stable for discharge to home.  Follow up will be arranged in 4 weeks.  Wound care and restrictions were reviewed with the patient prior to discharge.   Physical Exam: Filed Vitals:   05/27/15 1515 05/27/15 2036 05/28/15 0003 05/28/15 0412  BP: 105/73  91/51 113/58 124/72  Pulse: 79 70 64 86  Temp:  98.2 F (36.8 C)  97.7 F (36.5 C)  TempSrc:  Oral  Oral  Resp:  16  18  Height:      Weight:    236 lb 3.2 oz (107.14 kg)  SpO2:  97%  98%    GEN- The patient is obese appearing, alert and oriented x 3 today.   HEENT: normocephalic, atraumatic; sclera clear, conjunctiva pink; hearing intact; oropharynx clear; neck supple Lungs- Clear to ausculation bilaterally, normal work of breathing.  No wheezes, rales, rhonchi Heart- Regular rate and rhythm  GI- soft, non-tender, non-distended, bowel sounds present Extremities- no clubbing, cyanosis, or edema; DP/PT/radial pulses 2+ bilaterally, groin without hematoma/bruit MS- no significant deformity or atrophy Skin- warm and dry, no rash or lesion Psych- euthymic mood, full affect Neuro- strength and sensation are intact   Labs:   Lab Results  Component Value Date   WBC 7.3 05/19/2015   HGB 13.0 05/19/2015   HCT 39.9 05/19/2015   MCV 86.9 05/19/2015   PLT 403* 05/19/2015     Recent Labs Lab 05/27/15 0830  K 3.0*    Discharge Medications:    Medication List    TAKE these medications        albuterol 108 (90 BASE) MCG/ACT inhaler  Commonly known as:  PROVENTIL HFA;VENTOLIN HFA  Inhale 2 puffs into the lungs every 6 (six) hours as needed. For shortness of breath     albuterol (2.5 MG/3ML) 0.083% nebulizer solution  Commonly known as:  PROVENTIL  Take 3 mLs (2.5 mg total)  by nebulization every 6 (six) hours as needed. For shortness of breath     ARIPiprazole 5 MG tablet  Commonly known as:  ABILIFY  Take 1 tablet (5 mg total) by mouth at bedtime.     budesonide-formoterol 160-4.5 MCG/ACT inhaler  Commonly known as:  SYMBICORT  Inhale 2 puffs into the lungs 2 (two) times daily.     DEXILANT 60 MG capsule  Generic drug:  dexlansoprazole  Take 60 mg by mouth every morning.     diltiazem 120 MG 24 hr capsule  Commonly known as:  CARDIZEM CD  Take 1 capsule (120 mg  total) by mouth daily.     flecainide 100 MG tablet  Commonly known as:  TAMBOCOR  Take 1 tablet (100 mg total) by mouth every 12 (twelve) hours.     potassium chloride SA 20 MEQ tablet  Commonly known as:  K-DUR,KLOR-CON  Take 2 tablets (40 mEq total) by mouth daily.     senna-docusate 8.6-50 MG tablet  Commonly known as:  Senokot-S  Take 1 tablet by mouth at bedtime as needed for mild constipation.     sertraline 25 MG tablet  Commonly known as:  ZOLOFT  Take 1 tablet (25 mg total) by mouth daily.     traZODone 100 MG tablet  Commonly known as:  DESYREL  Take 100 mg by mouth at bedtime as needed for sleep.     XARELTO 20 MG Tabs tablet  Generic drug:  rivaroxaban  Take 20 mg by mouth daily after supper.        Disposition:  Discharge Instructions    Diet - low sodium heart healthy    Complete by:  As directed      Increase activity slowly    Complete by:  As directed           Follow-up Information    Follow up with Cristopher Peru, MD On 07/07/2015.   Specialty:  Cardiology   Why:  at 12Noon   Contact information:   [address] [place] [address] [phone]       Duration of Discharge Encounter: Greater than 30 minutes including physician time.  Signed, Chanetta Marshall, NP 05/28/2015 7:29 AM  EP Attending  Patient seen and examined. Agree with above. She is stable for discharge and has had no atrial arrhythmias overnight. I would like her to stay on flecainide and cardizem. She is on anti-coagulation for PE's. I would anticipate reducing one or both of her heart rhythm drugs when she returns to see me back in followup.   [name].D.

## 2015-06-16 ENCOUNTER — Inpatient Hospital Stay: Payer: Self-pay | Admitting: Student

## 2015-06-23 ENCOUNTER — Ambulatory Visit: Payer: Medicare PPO | Admitting: Student

## 2015-06-26 ENCOUNTER — Encounter: Payer: Self-pay | Admitting: Student

## 2015-06-26 ENCOUNTER — Ambulatory Visit (INDEPENDENT_AMBULATORY_CARE_PROVIDER_SITE_OTHER): Payer: Medicare PPO | Admitting: Student

## 2015-06-26 VITALS — BP 142/90 | HR 72 | Temp 97.8°F | Ht 64.0 in | Wt 230.0 lb

## 2015-06-26 DIAGNOSIS — E039 Hypothyroidism, unspecified: Secondary | ICD-10-CM | POA: Diagnosis not present

## 2015-06-26 DIAGNOSIS — J45901 Unspecified asthma with (acute) exacerbation: Secondary | ICD-10-CM

## 2015-06-26 DIAGNOSIS — Z8679 Personal history of other diseases of the circulatory system: Secondary | ICD-10-CM | POA: Insufficient documentation

## 2015-06-26 DIAGNOSIS — F332 Major depressive disorder, recurrent severe without psychotic features: Secondary | ICD-10-CM

## 2015-06-26 DIAGNOSIS — Z1159 Encounter for screening for other viral diseases: Secondary | ICD-10-CM

## 2015-06-26 DIAGNOSIS — M170 Bilateral primary osteoarthritis of knee: Secondary | ICD-10-CM

## 2015-06-26 DIAGNOSIS — E876 Hypokalemia: Secondary | ICD-10-CM

## 2015-06-26 HISTORY — DX: Personal history of other diseases of the circulatory system: Z86.79

## 2015-06-26 LAB — POTASSIUM: Potassium: 3.2 mmol/L — ABNORMAL LOW (ref 3.5–5.3)

## 2015-06-26 MED ORDER — SERTRALINE HCL 25 MG PO TABS: 50.0000 mg | ORAL_TABLET | Freq: Every day | ORAL | Status: DC

## 2015-06-26 NOTE — Assessment & Plan Note (Signed)
Status post CTI ablation on 05/27/2015. She says she has been feeling well since that time. Denies palpitation, chest pain, shortness of breath and swelling in her legs today. She has been taking her medication diligently.

## 2015-06-26 NOTE — Assessment & Plan Note (Signed)
Likely osteoarthritis. Recommended trying tylenol 650 mg three times a day as needed pain and Aspercreme. Couldn't recommend NSAID given that she is on Xarelto.

## 2015-06-26 NOTE — Patient Instructions (Signed)
It was great seeing you today! We have addressed the following issues today  1. Knee pain: I would like you to try tylenol 650 mg up to three times a days as needed for pain. I don't want you to take this medicine continuously for more than a week. You can also try Aspercreme, which is available over the counter.  2. Heart: your exam today is normal. Your blood pressure is also within normal range. Continue taking your medication.   If we did any lab work today, and the results require attention, either me or my nurse will get in touch with you. If everything is normal, you will get a letter in mail. If you don't hear from [place] in two weeks, please give [place] a call. Otherwise, I look forward to talking with you again at our next visit. If you have any questions or concerns before then, please call the clinic at [phone].  Please bring all your medications to every doctors visit   Sign up for My Chart to have easy access to your labs results, and communication with your Primary care physician.    Please check-out at the front desk before leaving the clinic.   Happy New Year!

## 2015-06-26 NOTE — Assessment & Plan Note (Signed)
Patient reports that she has been on disability for asthma and she has to renew it every year.  She is on albuterol and Symbicort at home. She reports shortness of breath with walking a block. However, she hasn't needed her albuterol much. However, patient has normal spirometery in 10/2013. Although spirometry could be normal between episodes in mild asthma, I am not convinced that she has uncontrolled or persistent asthma to qualify her for disabilty. I have discussed this with a patient using the PFT results. She understood and appreciated me for taking time to explain everything to her.

## 2015-06-26 NOTE — Progress Notes (Signed)
Subjective:    Patient ID: [name] White, female    DOB: 1956-04-17, 59 y.o.   MRN: [id number]  HPI Follow up on atrial flutter: patient had CTI ablation for atrial flutter about a month ago. She says she has been feeling well since that time. Denies palpitation, chest pain, shortness of breath and swelling in her legs today. She has been taking her medication diligently.  Knee pain: gotten worse over the last four years. Worse with standing and walking. No pain with sitting. No swelling. No other joint pains.  No fever, chills or night sweat. Weight stable. Tried tramadol in the past but didn't help a lot.   Disability form: patient reports that she has been on disability for asthma and she has to renew it every year. She is on albuterol and Symbicort at home. However, her last PFT in 10/2013 was unremarkable except for mild obstruction. FEV1/FVC 97% before bronchodilator and 103% after. She reports shortness of breath with walking a block. However, she hasn't needed her albuterol much.   Depression: says the holiday was a little bit stressful but reports good mood. She is taking her Zoloft 50 mg and Abilify 5 mg. She denies suicidal and homicidal ideation. She has an appointment with [facility] on 07/07/2015. She appears motivated to work on her weight. She says she has been trying eat healthy. She has successfully lost 6 lbs over the last one month.  Review of Systems Per HPI    Objective:   Physical Exam Filed Vitals:   06/26/15 1021 06/26/15 1108  BP: 150/93 142/90  Pulse: 72   Temp: 97.8 F (36.6 C)   TempSrc: Oral   Height: 5\' 4"  (1.626 m)   Weight: 230 lb (104.327 kg)    Gen: appears well, well dressed CV: regular rate and rythm. S1 & S2 audible, no murmurs. Resp: no apparent work of breathing, clear to auscultation bilaterally. GI: bowel sounds normal, no tenderness to palpation, no rebound or guarding, no mass.  MSK: knees appears symmetric without swelling, point  tenderness. She has full range of motion with some crepitus bilaterally. No leg edema.    Assessment & Plan:  History of atrial flutter Status post CTI ablation on 05/27/2015. She says she has been feeling well since that time. Denies palpitation, chest pain, shortness of breath and swelling in her legs today. She has been taking her medication diligently.  Asthma Patient reports that she has been on disability for asthma and she has to renew it every year.  She is on albuterol and Symbicort at home. She reports shortness of breath with walking a block. However, she hasn't needed her albuterol much. However, patient has normal spirometery in 10/2013. Although spirometry could be normal between episodes in mild asthma, I am not convinced that she has uncontrolled or persistent asthma to qualify her for disabilty. I have discussed this with a patient using the PFT results. She understood and appreciated me for taking time to explain everything to her.  Osteoarthritis of both knees Likely osteoarthritis. Recommended trying tylenol 650 mg three times a day as needed pain and Aspercreme. Couldn't recommend NSAID given that she is on Xarelto.  MDD (major depressive disorder), recurrent severe, without psychosis Says the holiday was a little bit stressful but reports good mood. She is taking her Zoloft 50 mg and Abilify 5 mg. She denies suicidal and homicidal ideation. She has an appointment with [facility] on 07/07/2015. She appears motivated to work on her weight. She  says she has been trying eat healthy. She has successfully lost 6 lbs over the last one month.

## 2015-06-26 NOTE — Assessment & Plan Note (Signed)
Says the holiday was a little bit stressful but reports good mood. She is taking her Zoloft 50 mg and Abilify 5 mg. She denies suicidal and homicidal ideation. She has an appointment with [facility] on 07/07/2015. She appears motivated to work on her weight. She says she has been trying eat healthy. She has successfully lost 6 lbs over the last one month.

## 2015-06-27 ENCOUNTER — Encounter: Payer: Self-pay | Admitting: Student

## 2015-06-27 LAB — HEPATITIS C ANTIBODY: HCV Ab: NEGATIVE

## 2015-07-07 ENCOUNTER — Encounter: Payer: Self-pay | Admitting: Internal Medicine

## 2015-07-15 ENCOUNTER — Encounter: Payer: Medicare PPO | Admitting: Internal Medicine

## 2015-07-29 ENCOUNTER — Encounter: Payer: Self-pay | Admitting: Internal Medicine

## 2015-07-29 ENCOUNTER — Ambulatory Visit (INDEPENDENT_AMBULATORY_CARE_PROVIDER_SITE_OTHER): Payer: Medicare PPO | Admitting: Internal Medicine

## 2015-07-29 VITALS — BP 130/82 | HR 69 | Ht 64.0 in | Wt 230.8 lb

## 2015-07-29 DIAGNOSIS — I471 Supraventricular tachycardia: Secondary | ICD-10-CM | POA: Diagnosis not present

## 2015-07-29 DIAGNOSIS — I484 Atypical atrial flutter: Secondary | ICD-10-CM

## 2015-07-29 DIAGNOSIS — I4892 Unspecified atrial flutter: Secondary | ICD-10-CM | POA: Insufficient documentation

## 2015-07-29 DIAGNOSIS — I10 Essential (primary) hypertension: Secondary | ICD-10-CM | POA: Diagnosis not present

## 2015-07-29 MED ORDER — FLECAINIDE ACETATE 100 MG PO TABS: 100.0000 mg | ORAL_TABLET | Freq: Two times a day (BID) | ORAL | Status: DC

## 2015-07-29 MED ORDER — DILTIAZEM HCL ER COATED BEADS 120 MG PO CP24: 120.0000 mg | ORAL_CAPSULE | Freq: Every day | ORAL | Status: DC

## 2015-07-29 NOTE — Patient Instructions (Signed)
Medication Instructions:  Your physician recommends that you continue on your current medications as directed. Please refer to the Current Medication list given to you today.   Labwork: None ordered   Testing/Procedures: None ordered   Follow-Up: Your physician wants you to follow-up in: 9 months with Dr Taylor You will receive a reminder letter in the mail two months in advance. If you don't receive a letter, please call our office to schedule the follow-up appointment.   Any Other Special Instructions Will Be Listed Below (If Applicable).     If you need a refill on your cardiac medications before your next appointment, please call your pharmacy.   

## 2015-07-29 NOTE — Assessment & Plan Note (Signed)
She is s/p ablation but was found to have multiple reentrant circuits and all were not successfully ablated. She is doing well and maintaining NSR. She will continue her flecainide.

## 2015-07-29 NOTE — Progress Notes (Signed)
HPI [name] returns today for followup. She is a very pleasant 60 year old woman with a history of recurrent SVT, hypertension, and COPD. She continues to have palpitations. She returns today for followup. She has lost over 70 lbs. No chest pain. She has not had heart racing on flecainide.  Allergies  Allergen Reactions  . Caffeine Other (See Comments)    Migraine  . Vicodin [Hydrocodone-Acetaminophen] Nausea And Vomiting and Other (See Comments)    Headache   . Ciprofloxacin Hives  . Erythromycin Hives  . Lisinopril Cough  . Oxycodone Nausea And Vomiting    headache  . Sulfamethoxazole-Trimethoprim Rash     Current Outpatient Prescriptions  Medication Sig Dispense Refill  . albuterol (PROVENTIL HFA;VENTOLIN HFA) 108 (90 BASE) MCG/ACT inhaler Inhale 2 puffs into the lungs every 6 (six) hours as needed. For shortness of breath    . albuterol (PROVENTIL) (2.5 MG/3ML) 0.083% nebulizer solution Take 3 mLs (2.5 mg total) by nebulization every 6 (six) hours as needed. For shortness of breath 75 mL 12  . ARIPiprazole (ABILIFY) 5 MG tablet Take 1 tablet (5 mg total) by mouth at bedtime. 60 tablet 3  . budesonide-formoterol (SYMBICORT) 160-4.5 MCG/ACT inhaler Inhale 2 puffs into the lungs 2 (two) times daily.      Marland Kitchen dexlansoprazole (DEXILANT) 60 MG capsule Take 60 mg by mouth every morning.     . diltiazem (CARDIZEM CD) 120 MG 24 hr capsule Take 1 capsule (120 mg total) by mouth daily. 90 capsule 3  . flecainide (TAMBOCOR) 100 MG tablet Take 1 tablet (100 mg total) by mouth every 12 (twelve) hours. 180 tablet 3  . potassium chloride SA (K-DUR,KLOR-CON) 20 MEQ tablet Take 2 tablets (40 mEq total) by mouth daily. 180 tablet 3  . senna-docusate (SENOKOT-S) 8.6-50 MG per tablet Take 1 tablet by mouth at bedtime as needed for mild constipation. 30 tablet 3  . traZODone (DESYREL) 100 MG tablet Take 100 mg by mouth at bedtime as needed for sleep.    Alveda Reasons 20 MG TABS tablet Take 20 mg  by mouth daily after supper.  1  . sertraline (ZOLOFT) 50 MG tablet Take 1 tablet by mouth daily.  0   Current Facility-Administered Medications  Medication Dose Route Frequency Provider Last Rate Last Dose  . diphenhydrAMINE (BENADRYL) capsule 50 mg  50 mg Oral Q6H PRN Boykin Nearing, MD   50 mg at 01/06/12 1458     Past Medical History  Diagnosis Date  . Asthma   . Hypertension   . GERD (gastroesophageal reflux disease)   . Anxiety   . Depression   . Family history of anesthesia complication     "daughter; causes her to pass out afterwards"  . Pulmonary embolism ([facility]) 12/28/2013    "2 clots in each lung"  . Hyperthyroidism   . Migraine     "monthly" (12/28/2013)  . Osteoarthritis     "both knees; back of my neck; right pelvic bone" (12/28/2013)  . Chronic lower back pain   . Paroxysmal a-fib ([facility])     ROS:   All systems reviewed and negative except as noted in the HPI.   Past Surgical History  Procedure Laterality Date  . Abdominal hysterectomy    . Wisdom tooth extraction    . Thyroidectomy, partial Right 2005  . Nasal sinus surgery  2007  . Appendectomy    . Excisional hemorrhoidectomy    . Dilation and curettage of uterus    .  Tubal ligation    . Breast cyst excision Right   . Electrophysiologic study N/A 05/27/2015    Procedure: A-Flutter;  Surgeon: Evans Lance, MD;  Location: [facility];  Service: Cardiovascular;  Laterality: N/A;     Family History  Problem Relation Age of Onset  . Osteoarthritis Mother   . Asthma Mother   . Heart failure Mother      Social History   Social History  . Marital Status: Single    Spouse Name: N/A  . Number of Children: N/A  . Years of Education: N/A   Occupational History  . Not on file.   Social History Main Topics  . Smoking status: Former Smoker -- 0.25 packs/day for 20 years    Types: Cigarettes    Quit date: 07/17/1999  . Smokeless tobacco: Never Used  . Alcohol Use: Yes     Comment: "drank  some in my 30's"  . Drug Use: No  . Sexual Activity: No   Other Topics Concern  . Not on file   Social History Narrative     BP 130/82 mmHg  Pulse 69  Ht 5\' 4"  (1.626 m)  Wt 230 lb 12.8 oz (104.69 kg)  BMI 39.60 kg/m2  Physical Exam:  Well appearing 60 yo woman, NAD HEENT: Unremarkable Neck:  7 cm JVD, no thyromegally Back:  No CVA tenderness Lungs:  Clear with no wheezes HEART:  Regular rate rhythm, no murmurs, no rubs, no clicks Abd:  soft, positive bowel sounds, no organomegally, no rebound, no guarding Ext:  2 plus pulses, no edema, no cyanosis, no clubbing Skin:  No rashes no nodules Neuro:  CN II through XII intact, motor grossly intact  ECG - nsr  Assess/Plan:

## 2015-07-29 NOTE — Assessment & Plan Note (Signed)
Her blood pressure is controlled today. She will continue her current meds and her weight loss program.

## 2015-09-06 ENCOUNTER — Encounter (HOSPITAL_COMMUNITY): Payer: Self-pay | Admitting: Oncology

## 2015-09-06 ENCOUNTER — Emergency Department (HOSPITAL_COMMUNITY)
Admission: EM | Admit: 2015-09-06 | Discharge: 2015-09-07 | Disposition: A | Discharge status: Other Acute Inpatient Hospital | Payer: Medicare PPO | Attending: Emergency Medicine | Admitting: Emergency Medicine

## 2015-09-06 DIAGNOSIS — Z79899 Other long term (current) drug therapy: Secondary | ICD-10-CM | POA: Diagnosis not present

## 2015-09-06 DIAGNOSIS — Z86711 Personal history of pulmonary embolism: Secondary | ICD-10-CM | POA: Diagnosis not present

## 2015-09-06 DIAGNOSIS — Z7901 Long term (current) use of anticoagulants: Secondary | ICD-10-CM | POA: Insufficient documentation

## 2015-09-06 DIAGNOSIS — G8929 Other chronic pain: Secondary | ICD-10-CM | POA: Diagnosis not present

## 2015-09-06 DIAGNOSIS — F419 Anxiety disorder, unspecified: Secondary | ICD-10-CM | POA: Diagnosis not present

## 2015-09-06 DIAGNOSIS — I480 Paroxysmal atrial fibrillation: Secondary | ICD-10-CM | POA: Diagnosis not present

## 2015-09-06 DIAGNOSIS — F332 Major depressive disorder, recurrent severe without psychotic features: Secondary | ICD-10-CM | POA: Diagnosis not present

## 2015-09-06 DIAGNOSIS — R45851 Suicidal ideations: Secondary | ICD-10-CM | POA: Diagnosis not present

## 2015-09-06 DIAGNOSIS — M158 Other polyosteoarthritis: Secondary | ICD-10-CM | POA: Diagnosis not present

## 2015-09-06 DIAGNOSIS — Z8639 Personal history of other endocrine, nutritional and metabolic disease: Secondary | ICD-10-CM | POA: Diagnosis not present

## 2015-09-06 DIAGNOSIS — J45909 Unspecified asthma, uncomplicated: Secondary | ICD-10-CM | POA: Diagnosis not present

## 2015-09-06 DIAGNOSIS — I10 Essential (primary) hypertension: Secondary | ICD-10-CM | POA: Diagnosis not present

## 2015-09-06 DIAGNOSIS — Z7951 Long term (current) use of inhaled steroids: Secondary | ICD-10-CM | POA: Diagnosis not present

## 2015-09-06 DIAGNOSIS — K219 Gastro-esophageal reflux disease without esophagitis: Secondary | ICD-10-CM | POA: Insufficient documentation

## 2015-09-06 LAB — ACETAMINOPHEN LEVEL: Acetaminophen (Tylenol), Serum: <10 ug/mL — ABNORMAL LOW (ref 10–30)

## 2015-09-06 LAB — COMPREHENSIVE METABOLIC PANEL
ALT: 11 U/L — ABNORMAL LOW (ref 14–54)
ANION GAP: 10 (ref 5–15)
AST: 16 U/L (ref 15–41)
Albumin: 3.5 g/dL (ref 3.5–5.0)
Alkaline Phosphatase: 75 U/L (ref 38–126)
BILIRUBIN TOTAL: 0.5 mg/dL (ref 0.3–1.2)
BUN: 9 mg/dL (ref 6–20)
CHLORIDE: 105 mmol/L (ref 101–111)
CO2: 27 mmol/L (ref 22–32)
Calcium: 9.2 mg/dL (ref 8.9–10.3)
Creatinine, Ser: 0.58 mg/dL (ref 0.44–1.00)
Glucose, Bld: 110 mg/dL — ABNORMAL HIGH (ref 65–99)
POTASSIUM: 3.1 mmol/L — AB (ref 3.5–5.1)
Sodium: 142 mmol/L (ref 135–145)
TOTAL PROTEIN: 7.8 g/dL (ref 6.5–8.1)

## 2015-09-06 LAB — CBC
HCT: 41.7 % (ref 36.0–46.0)
Hemoglobin: 13.5 g/dL (ref 12.0–15.0)
MCH: 30.1 pg (ref 26.0–34.0)
MCHC: 32.4 g/dL (ref 30.0–36.0)
MCV: 92.9 fL (ref 78.0–100.0)
PLATELETS: 391 10*3/uL (ref 150–400)
RBC: 4.49 MIL/uL (ref 3.87–5.11)
RDW: 16.1 % — AB (ref 11.5–15.5)
WBC: 7.7 10*3/uL (ref 4.0–10.5)

## 2015-09-06 LAB — RAPID URINE DRUG SCREEN, HOSP PERFORMED
Amphetamines: NOT DETECTED
Barbiturates: NOT DETECTED
Benzodiazepines: NOT DETECTED
Cocaine: NOT DETECTED
OPIATES: NOT DETECTED
Tetrahydrocannabinol: NOT DETECTED

## 2015-09-06 LAB — SALICYLATE LEVEL

## 2015-09-06 LAB — ETHANOL

## 2015-09-06 MED ORDER — TRAZODONE HCL 50 MG PO TABS: 50.0000 mg | ORAL_TABLET | Freq: Every evening | ORAL | Status: DC | PRN

## 2015-09-06 MED ORDER — SENNOSIDES-DOCUSATE SODIUM 8.6-50 MG PO TABS: 1.0000 | ORAL_TABLET | Freq: Every evening | ORAL | Status: DC | PRN

## 2015-09-06 MED ORDER — ARIPIPRAZOLE 5 MG PO TABS
5.0000 mg | ORAL_TABLET | Freq: Every day | ORAL | Status: DC
  Administered 2015-09-07: 5 mg via ORAL
  Filled 2015-09-06 (×2): qty 1

## 2015-09-06 MED ORDER — DILTIAZEM HCL ER COATED BEADS 120 MG PO CP24
120.0000 mg | ORAL_CAPSULE | Freq: Every day | ORAL | Status: DC
  Administered 2015-09-07: 120 mg via ORAL
  Filled 2015-09-06 (×3): qty 1

## 2015-09-06 MED ORDER — RIVAROXABAN 20 MG PO TABS
20.0000 mg | ORAL_TABLET | Freq: Every day | ORAL | Status: DC
  Administered 2015-09-07: 20 mg via ORAL
  Filled 2015-09-06 (×3): qty 1

## 2015-09-06 MED ORDER — ALBUTEROL SULFATE HFA 108 (90 BASE) MCG/ACT IN AERS: 2.0000 | INHALATION_SPRAY | Freq: Four times a day (QID) | RESPIRATORY_TRACT | Status: DC | PRN

## 2015-09-06 MED ORDER — SERTRALINE HCL 50 MG PO TABS: 50.0000 mg | ORAL_TABLET | Freq: Every day | ORAL | Status: DC

## 2015-09-06 MED ORDER — POTASSIUM CHLORIDE CRYS ER 20 MEQ PO TBCR
40.0000 meq | EXTENDED_RELEASE_TABLET | Freq: Every day | ORAL | Status: DC
  Administered 2015-09-07: 40 meq via ORAL
  Filled 2015-09-06: qty 2

## 2015-09-06 MED ORDER — PANTOPRAZOLE SODIUM 40 MG PO TBEC
40.0000 mg | DELAYED_RELEASE_TABLET | Freq: Every day | ORAL | Status: DC
  Administered 2015-09-07: 40 mg via ORAL
  Filled 2015-09-06: qty 1

## 2015-09-06 MED ORDER — FLECAINIDE ACETATE 100 MG PO TABS
100.0000 mg | ORAL_TABLET | Freq: Two times a day (BID) | ORAL | Status: DC
  Administered 2015-09-07 (×2): 100 mg via ORAL
  Filled 2015-09-06 (×4): qty 1

## 2015-09-06 NOTE — ED Notes (Signed)
Per EMS pt presents d/t suicidal ideation.  Pt has hx of depression.  Reported to EMS that she has been having suicidal ideation w/o plan all day.  Pt is A&O x 4.  Calm and cooperative at this time.

## 2015-09-06 NOTE — ED Provider Notes (Signed)
CSN: [id number]     Arrival date & time 09/06/15  2200 History   First MD Initiated Contact with Patient 09/06/15 2215     Chief Complaint  Patient presents with  . Suicidal   HPI   [name] White is a 60 y.o. female PMH significant for asthma, hypertension, anxiety, depression, pulmonary embolism, paroxysmal a-fib presenting with suicidal ideations. She states that her daughter came to visit her, and she has not seen her daughter and 6 years, and this caused her to become severely depressed today. She states that she has been thinking of suicidal thoughts off and on for the past few weeks. She denies a plan. She denies hallucinations, homicidal ideations, fevers, chills, headaches, abdominal pain, shortness of breath, nausea, vomiting, change in bowel or bladder habits.  Past Medical History  Diagnosis Date  . Asthma   . Hypertension   . GERD (gastroesophageal reflux disease)   . Anxiety   . Depression   . Family history of anesthesia complication     "daughter; causes her to pass out afterwards"  . Pulmonary embolism ([place]) 12/28/2013    "2 clots in each lung"  . Hyperthyroidism   . Migraine     "monthly" (12/28/2013)  . Osteoarthritis     "both knees; back of my neck; right pelvic bone" (12/28/2013)  . Chronic lower back pain   . Paroxysmal a-fib [facility])    Past Surgical History  Procedure Laterality Date  . Abdominal hysterectomy    . Wisdom tooth extraction    . Thyroidectomy, partial Right 2005  . Nasal sinus surgery  2007  . Appendectomy    . Excisional hemorrhoidectomy    . Dilation and curettage of uterus    . Tubal ligation    . Breast cyst excision Right   . Electrophysiologic study N/A 05/27/2015    Procedure: A-Flutter;  Surgeon: Evans Lance, MD;  Location: [facility];  Service: Cardiovascular;  Laterality: N/A;   Family History  Problem Relation Age of Onset  . Osteoarthritis Mother   . Asthma Mother   . Heart failure Mother    Social History   Substance Use Topics  . Smoking status: Former Smoker -- 0.25 packs/day for 20 years    Types: Cigarettes    Quit date: 07/17/1999  . Smokeless tobacco: Never Used  . Alcohol Use: Yes     Comment: "drank some in my 30's"   OB History    No data available     Review of Systems  Ten systems are reviewed and are negative for acute change except as noted in the HPI  Allergies  Caffeine; Vicodin; Ciprofloxacin; Erythromycin; Lisinopril; Oxycodone; and Sulfamethoxazole-trimethoprim  Home Medications   Prior to Admission medications   Medication Sig Start Date End Date Taking? Authorizing Provider  albuterol (PROVENTIL HFA;VENTOLIN HFA) 108 (90 BASE) MCG/ACT inhaler Inhale 2 puffs into the lungs every 6 (six) hours as needed. For shortness of breath   Yes Historical Provider, MD  albuterol (PROVENTIL) (2.5 MG/3ML) 0.083% nebulizer solution Take 3 mLs (2.5 mg total) by nebulization every 6 (six) hours as needed. For shortness of breath 04/07/13  Yes Hilton Sinclair, MD  ARIPiprazole (ABILIFY) 5 MG tablet Take 1 tablet (5 mg total) by mouth at bedtime. 09/30/14  Yes Alyssa A Lincoln Brigham, MD  budesonide-formoterol (SYMBICORT) 160-4.5 MCG/ACT inhaler Inhale 2 puffs into the lungs 2 (two) times daily.     Yes Historical Provider, MD  dexlansoprazole (DEXILANT) 60 MG capsule  Take 60 mg by mouth daily as needed (for heartburn/acid reflux.).    Yes Historical Provider, MD  diltiazem (CARDIZEM CD) 120 MG 24 hr capsule Take 1 capsule (120 mg total) by mouth daily. 07/29/15  Yes Evans Lance, MD  flecainide (TAMBOCOR) 100 MG tablet Take 1 tablet (100 mg total) by mouth every 12 (twelve) hours. 07/29/15  Yes Evans Lance, MD  potassium chloride SA (K-DUR,KLOR-CON) 20 MEQ tablet Take 2 tablets (40 mEq total) by mouth daily. 05/26/15  Yes Evans Lance, MD  senna-docusate (SENOKOT-S) 8.6-50 MG per tablet Take 1 tablet by mouth at bedtime as needed for mild constipation. 09/30/14  Yes Alyssa A Haney, MD   sertraline (ZOLOFT) 50 MG tablet Take 1 tablet by mouth daily. 06/26/15  Yes Historical Provider, MD  traZODone (DESYREL) 100 MG tablet Take 50-100 mg by mouth at bedtime as needed for sleep.    Yes Historical Provider, MD  XARELTO 20 MG TABS tablet Take 20 mg by mouth daily after supper. 02/28/15  Yes Historical Provider, MD   BP 142/79 mmHg  Pulse 70  Temp(Src) 98.2 F (36.8 C) (Oral)  Resp 18  Ht 5\' 5"  (1.651 m)  Wt 104.781 kg  BMI 38.44 kg/m2  SpO2 98% Physical Exam  Constitutional: She appears well-developed and well-nourished. No distress.  HENT:  Head: Normocephalic and atraumatic.  Eyes: Conjunctivae are normal. Right eye exhibits no discharge. Left eye exhibits no discharge. No scleral icterus.  Neck: No tracheal deviation present.  Cardiovascular: Normal rate and regular rhythm.   Pulmonary/Chest: Effort normal and breath sounds normal. No respiratory distress.  Abdominal: She exhibits no distension.  Musculoskeletal: She exhibits no edema.  Lymphadenopathy:    She has no cervical adenopathy.  Neurological: She is alert. Coordination normal.  Skin: Skin is warm and dry. No rash noted. She is not diaphoretic. No erythema.  Psychiatric:  Anxious, crying  Nursing note and vitals reviewed.   ED Course  Procedures  Labs Review Labs Reviewed  CBC - Abnormal; Notable for the following:    RDW 16.1 (*)    All other components within normal limits  COMPREHENSIVE METABOLIC PANEL  ETHANOL  SALICYLATE LEVEL  ACETAMINOPHEN LEVEL  URINE RAPID DRUG SCREEN, HOSP PERFORMED   MDM   Final diagnoses:  MDD (major depressive disorder), recurrent severe, without psychosis ([place])   Patient nontoxic appearing, VSS.  CMP, CBC, ETOH, salicyclate, acetaminophen, UDS unremarkable for acute change. Patient is medically cleared at this time. Psych hold orders placed, TTS consult orders place. Home meds ordered.   [place], PA-C 09/13/15 1412  Tanna Furry, MD 09/20/15  [phone]

## 2015-09-07 ENCOUNTER — Inpatient Hospital Stay (HOSPITAL_COMMUNITY)
Admission: AD | Admit: 2015-09-07 | Discharge: 2015-09-16 | DRG: 885 | Disposition: A | Discharge status: Home / Self Care | Payer: Medicare PPO | Source: Intra-hospital | Attending: Psychiatry | Admitting: Psychiatry

## 2015-09-07 DIAGNOSIS — K219 Gastro-esophageal reflux disease without esophagitis: Secondary | ICD-10-CM | POA: Diagnosis present

## 2015-09-07 DIAGNOSIS — J45909 Unspecified asthma, uncomplicated: Secondary | ICD-10-CM | POA: Diagnosis present

## 2015-09-07 DIAGNOSIS — G8929 Other chronic pain: Secondary | ICD-10-CM | POA: Diagnosis present

## 2015-09-07 DIAGNOSIS — I480 Paroxysmal atrial fibrillation: Secondary | ICD-10-CM | POA: Diagnosis present

## 2015-09-07 DIAGNOSIS — Z86711 Personal history of pulmonary embolism: Secondary | ICD-10-CM | POA: Diagnosis not present

## 2015-09-07 DIAGNOSIS — Z8249 Family history of ischemic heart disease and other diseases of the circulatory system: Secondary | ICD-10-CM

## 2015-09-07 DIAGNOSIS — Z825 Family history of asthma and other chronic lower respiratory diseases: Secondary | ICD-10-CM | POA: Diagnosis not present

## 2015-09-07 DIAGNOSIS — F332 Major depressive disorder, recurrent severe without psychotic features: Secondary | ICD-10-CM | POA: Diagnosis present

## 2015-09-07 DIAGNOSIS — I10 Essential (primary) hypertension: Secondary | ICD-10-CM | POA: Diagnosis present

## 2015-09-07 DIAGNOSIS — M545 Low back pain: Secondary | ICD-10-CM | POA: Diagnosis present

## 2015-09-07 DIAGNOSIS — G4700 Insomnia, unspecified: Secondary | ICD-10-CM | POA: Diagnosis present

## 2015-09-07 DIAGNOSIS — F419 Anxiety disorder, unspecified: Secondary | ICD-10-CM | POA: Diagnosis present

## 2015-09-07 DIAGNOSIS — R45851 Suicidal ideations: Secondary | ICD-10-CM

## 2015-09-07 DIAGNOSIS — Z87891 Personal history of nicotine dependence: Secondary | ICD-10-CM | POA: Diagnosis not present

## 2015-09-07 DIAGNOSIS — I471 Supraventricular tachycardia: Secondary | ICD-10-CM

## 2015-09-07 DIAGNOSIS — M1990 Unspecified osteoarthritis, unspecified site: Secondary | ICD-10-CM | POA: Diagnosis present

## 2015-09-07 DIAGNOSIS — M25561 Pain in right knee: Secondary | ICD-10-CM | POA: Diagnosis present

## 2015-09-07 DIAGNOSIS — E0590 Thyrotoxicosis, unspecified without thyrotoxic crisis or storm: Secondary | ICD-10-CM | POA: Diagnosis present

## 2015-09-07 DIAGNOSIS — M25562 Pain in left knee: Secondary | ICD-10-CM | POA: Diagnosis present

## 2015-09-07 MED ORDER — LORAZEPAM 0.5 MG PO TABS
0.5000 mg | ORAL_TABLET | Freq: Once | ORAL | Status: AC
  Administered 2015-09-07: 0.5 mg via ORAL
  Filled 2015-09-07: qty 1

## 2015-09-07 MED ORDER — HYDROXYZINE HCL 25 MG PO TABS
25.0000 mg | ORAL_TABLET | Freq: Four times a day (QID) | ORAL | Status: DC | PRN
  Administered 2015-09-10 – 2015-09-15 (×8): 25 mg via ORAL
  Filled 2015-09-07 (×8): qty 1

## 2015-09-07 MED ORDER — ARIPIPRAZOLE 5 MG PO TABS
5.0000 mg | ORAL_TABLET | Freq: Every day | ORAL | Status: DC
  Administered 2015-09-07 – 2015-09-15 (×9): 5 mg via ORAL
  Filled 2015-09-07 (×12): qty 1

## 2015-09-07 MED ORDER — TRAZODONE HCL 100 MG PO TABS: 200.0000 mg | ORAL_TABLET | Freq: Every day | ORAL | Status: DC

## 2015-09-07 MED ORDER — POTASSIUM CHLORIDE CRYS ER 10 MEQ PO TBCR
10.0000 meq | EXTENDED_RELEASE_TABLET | Freq: Every day | ORAL | Status: DC
  Administered 2015-09-07: 10 meq via ORAL
  Filled 2015-09-07: qty 1

## 2015-09-07 MED ORDER — SERTRALINE HCL 50 MG PO TABS: 100.0000 mg | ORAL_TABLET | Freq: Every day | ORAL | Status: DC

## 2015-09-07 MED ORDER — POTASSIUM CHLORIDE CRYS ER 20 MEQ PO TBCR
40.0000 meq | EXTENDED_RELEASE_TABLET | Freq: Every day | ORAL | Status: DC
  Administered 2015-09-08 – 2015-09-16 (×9): 40 meq via ORAL
  Filled 2015-09-07 (×13): qty 2

## 2015-09-07 MED ORDER — TRAZODONE HCL 100 MG PO TABS: 100.0000 mg | ORAL_TABLET | Freq: Every day | ORAL | Status: DC

## 2015-09-07 MED ORDER — FLECAINIDE ACETATE 100 MG PO TABS
100.0000 mg | ORAL_TABLET | Freq: Two times a day (BID) | ORAL | Status: DC
  Administered 2015-09-07 – 2015-09-16 (×18): 100 mg via ORAL
  Filled 2015-09-07 (×23): qty 1

## 2015-09-07 MED ORDER — SERTRALINE HCL 50 MG PO TABS
50.0000 mg | ORAL_TABLET | Freq: Every day | ORAL | Status: DC
  Administered 2015-09-08: 50 mg via ORAL
  Filled 2015-09-07 (×4): qty 1

## 2015-09-07 MED ORDER — DILTIAZEM HCL ER COATED BEADS 120 MG PO CP24
120.0000 mg | ORAL_CAPSULE | Freq: Every day | ORAL | Status: DC
  Administered 2015-09-08 – 2015-09-16 (×9): 120 mg via ORAL
  Filled 2015-09-07 (×13): qty 1

## 2015-09-07 MED ORDER — ALUM & MAG HYDROXIDE-SIMETH 200-200-20 MG/5ML PO SUSP: 30.0000 mL | ORAL | Status: DC | PRN

## 2015-09-07 MED ORDER — TRAZODONE HCL 50 MG PO TABS: 50.0000 mg | ORAL_TABLET | Freq: Every evening | ORAL | Status: DC | PRN

## 2015-09-07 MED ORDER — POTASSIUM CHLORIDE CRYS ER 20 MEQ PO TBCR: 40.0000 meq | EXTENDED_RELEASE_TABLET | Freq: Two times a day (BID) | ORAL | Status: DC

## 2015-09-07 MED ORDER — MOMETASONE FURO-FORMOTEROL FUM 200-5 MCG/ACT IN AERO
2.0000 | INHALATION_SPRAY | Freq: Two times a day (BID) | RESPIRATORY_TRACT | Status: DC
  Administered 2015-09-08 – 2015-09-16 (×17): 2 via RESPIRATORY_TRACT
  Filled 2015-09-07 (×3): qty 8.8

## 2015-09-07 MED ORDER — MAGNESIUM HYDROXIDE 400 MG/5ML PO SUSP: 30.0000 mL | Freq: Every day | ORAL | Status: DC | PRN

## 2015-09-07 MED ORDER — TRAZODONE HCL 100 MG PO TABS
200.0000 mg | ORAL_TABLET | Freq: Every evening | ORAL | Status: DC | PRN
  Administered 2015-09-07: 200 mg via ORAL
  Filled 2015-09-07: qty 2

## 2015-09-07 MED ORDER — RIVAROXABAN 20 MG PO TABS
20.0000 mg | ORAL_TABLET | Freq: Every day | ORAL | Status: DC
  Administered 2015-09-08 – 2015-09-15 (×8): 20 mg via ORAL
  Filled 2015-09-07 (×10): qty 1

## 2015-09-07 MED ORDER — ALBUTEROL SULFATE (2.5 MG/3ML) 0.083% IN NEBU: 2.5000 mg | INHALATION_SOLUTION | RESPIRATORY_TRACT | Status: DC | PRN

## 2015-09-07 MED ORDER — SENNOSIDES-DOCUSATE SODIUM 8.6-50 MG PO TABS: 1.0000 | ORAL_TABLET | Freq: Every evening | ORAL | Status: DC | PRN

## 2015-09-07 MED ORDER — ALBUTEROL SULFATE HFA 108 (90 BASE) MCG/ACT IN AERS: 2.0000 | INHALATION_SPRAY | Freq: Four times a day (QID) | RESPIRATORY_TRACT | Status: DC | PRN

## 2015-09-07 MED ORDER — PANTOPRAZOLE SODIUM 40 MG PO TBEC
40.0000 mg | DELAYED_RELEASE_TABLET | Freq: Every day | ORAL | Status: DC
  Administered 2015-09-08 – 2015-09-16 (×9): 40 mg via ORAL
  Filled 2015-09-07 (×13): qty 1

## 2015-09-07 MED ORDER — LORAZEPAM 0.5 MG PO TABS
0.5000 mg | ORAL_TABLET | Freq: Once | ORAL | Status: AC
Start: 2015-09-07 — End: 2015-09-07
  Administered 2015-09-07: 0.5 mg via ORAL
  Filled 2015-09-07: qty 1

## 2015-09-07 NOTE — ED Notes (Signed)
Pt sts she is feeling nervous. MD Wilson Singer made aware.

## 2015-09-07 NOTE — Progress Notes (Signed)
Patient to be transferred to [address] hall after 2000.

## 2015-09-07 NOTE — ED Notes (Signed)
Report received from [facility]. Pt is at this time is alert and oriented in no distress denies SI, HI, AVH and pain. Will continue to monitor for safety via security cameras and Q 15 minute checks.

## 2015-09-07 NOTE — ED Notes (Signed)
Pt transferred to [facility]. Pt agreeable to the transfer. Pt denies SI, HI, pain or discomfort. Pt. belongings returned. Pt transported by [employer].

## 2015-09-07 NOTE — BHH Counselor (Signed)
Patient reviewed and singed voluntary consent to treat and ROI to no one. Patient is aware that she can request the ROI if she changes her mind.  Paperwork faxed to [facility] and provided to patients nurse to be transported with patient. Patient can go to [facility] after 8PM.   Rosalin Hawking, LCSW Therapeutic Triage Specialist [place] 09/07/2015 6:55 PM

## 2015-09-07 NOTE — ED Notes (Signed)
TTS consult in progress. °

## 2015-09-07 NOTE — BH Assessment (Addendum)
Patient has been accepted to [place] [facility] after 8PM per Letitia Libra, RN, [facility].  Informed patients nurse of acceptance.   Rosalin Hawking, LCSW Therapeutic Triage Specialist [place] 09/07/2015 4:07 PM

## 2015-09-07 NOTE — ED Notes (Signed)
Bed: [id number] Expected date:  Expected time:  Means of arrival:  Comments: [id number]
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
## 2015-09-07 NOTE — Consult Note (Signed)
[facility] Face-to-Face Psychiatry Consult   Reason for Consult:  Depression Referring Physician:  [name] Patient Identification: [name] White MRN:  [id number] Principal Diagnosis: MDD (major depressive disorder), recurrent severe, without psychosis ([place]) Diagnosis:   Patient Active Problem List   Diagnosis Date Noted  . MDD (major depressive disorder), recurrent severe, without psychosis ([place]) [F33.2] 03/05/2014    Priority: High  . Atrial flutter ([place]) [I48.92] 07/29/2015  . History of atrial flutter [Z86.79] 06/26/2015  . Chest pain [R07.9] 03/22/2015  . SOB (shortness of breath) [R06.02]   . Pulmonary embolism ([place]) [I26.99] 12/27/2013  . Vocal cord dysfunction [J38.3] 11/12/2013  . Mixed headache [R51] 01/06/2012  . OVERACTIVE BLADDER [N31.8] 04/18/2008  . Osteoarthritis of both knees [M17.0] 04/18/2008  . POLYARTHRITIS [M13.0] 09/14/2007  . Obesity [E66.9] 08/24/2006  . RESTLESS LEGS SYNDROME [G25.89] 08/24/2006  . HYPERTENSION, BENIGN SYSTEMIC [I10] 08/24/2006  . RHINITIS, ALLERGIC [J30.9] 08/24/2006  . Asthma [J45.909] 08/24/2006  . GASTROESOPHAGEAL REFLUX, NO ESOPHAGITIS [K21.9] 08/24/2006    Total Time spent with patient: 45 minutes  Subjective:   [name] White is a 60 y.o. female patient admitted with increased depression with suicidal ideation.  HPI:  [name] White is a 60 year-old [place] American female who presents to the emergency department with increased depression and suicidal ideation. On assessment, the patient is tearful and states that she came to the emergency department "because I was overwhelmed." Patient reports her daughter, who she hasn't seen in six years, came to visit and the patient reported feeling that "I'm emotionally breaking down." Patient currently endorses suicidal ideation with no specific plan but states "I don't want to hurt myself but I feel like I will." Client does not respond when asked if she has ever hurt herself before in the past. Client  reports symptoms including fatigue, insomnia, depression, and avolition. Client reports that she is receiving psychiatric services at [facility] but states that "my medications aren't working". Client is positive for suicidal ideation with no specific plan. Client denies homicidal ideation and auditory or visual hallucinations. Client denies any drug or alcohol use.  Past Psychiatric History:  - Patient reports a history of major depression - Receives psychiatric services at [facility] in [place] to Self: Suicidal Ideation: Yes-Currently Present Suicidal Intent: No (denies) Is patient at risk for suicide?: No (based on lack of intent) Suicidal Plan?: No (denies) Access to Means: No (denies) What has been your use of drugs/alcohol within the last 12 months?: none How many times?: 1 (2010- OD of sleeping pills) Other Self Harm Risks: none Triggers for Past Attempts: Other (Comment) (homelessness triggered per pt) Intentional Self Injurious Behavior: None Risk to Others: Homicidal Ideation: No (denies) Thoughts of Harm to Others: No (denies) Current Homicidal Intent: No (denies) Current Homicidal Plan: No (denies) Access to Homicidal Means: No (denies) Identified Victim: na History of harm to others?: No (denies) Assessment of Violence: None Noted Does patient have access to weapons?: No (denies) Criminal Charges Pending?: No Does patient have a court date: No Prior Inpatient Therapy: Prior Inpatient Therapy: Yes Prior Therapy Dates: 2010, 2016 Prior Therapy Facilty/Provider(s): [facility], [facility] Reason for Treatment: Depression, Suicide Attempt 2010 Prior Outpatient Therapy: Prior Outpatient Therapy: Yes Prior Therapy Dates: 2016 Prior Therapy Facilty/Provider(s): [facility] Reason for Treatment: Depression Does patient have an ACCT team?: No Does patient have Intensive In-House Services?  : No Does patient have [facility] services? : Yes Does patient have P4CC services?: No  Past Medical  History:  Past Medical History  Diagnosis Date  . Asthma   . Hypertension   . GERD (gastroesophageal reflux disease)   . Anxiety   . Depression   . Family history of anesthesia complication     "daughter; causes her to pass out afterwards"  . Pulmonary embolism ([place]) 12/28/2013    "2 clots in each lung"  . Hyperthyroidism   . Migraine     "monthly" (12/28/2013)  . Osteoarthritis     "both knees; back of my neck; right pelvic bone" (12/28/2013)  . Chronic lower back pain   . Paroxysmal a-fib [facility])     Past Surgical History  Procedure Laterality Date  . Abdominal hysterectomy    . Wisdom tooth extraction    . Thyroidectomy, partial Right 2005  . Nasal sinus surgery  2007  . Appendectomy    . Excisional hemorrhoidectomy    . Dilation and curettage of uterus    . Tubal ligation    . Breast cyst excision Right   . Electrophysiologic study N/A 05/27/2015    Procedure: A-Flutter;  Surgeon: Evans Lance, MD;  Location: [facility];  Service: Cardiovascular;  Laterality: N/A;   Family History:  Family History  Problem Relation Age of Onset  . Osteoarthritis Mother   . Asthma Mother   . Heart failure Mother    Family Psychiatric  History: None reported Social History:  History  Alcohol Use  . Yes    Comment: "drank some in my 30's"     History  Drug Use No    Social History   Social History  . Marital Status: Single    Spouse Name: N/A  . Number of Children: N/A  . Years of Education: N/A   Social History Main Topics  . Smoking status: Former Smoker -- 0.25 packs/day for 20 years    Types: Cigarettes    Quit date: 07/17/1999  . Smokeless tobacco: Never Used  . Alcohol Use: Yes     Comment: "drank some in my 30's"  . Drug Use: No  . Sexual Activity: No   Other Topics Concern  . None   Social History Narrative   Additional Social History:    Allergies:   Allergies  Allergen Reactions  . Caffeine Other (See Comments)    Migraine  . Vicodin  [Hydrocodone-Acetaminophen] Nausea And Vomiting and Other (See Comments)    Headache   . Ciprofloxacin Hives  . Erythromycin Hives  . Lisinopril Cough  . Oxycodone Nausea And Vomiting    headache  . Sulfamethoxazole-Trimethoprim Rash    Labs:  Results for orders placed or performed during the hospital encounter of 09/06/15 (from the past 48 hour(s))  Comprehensive metabolic panel     Status: Abnormal   Collection Time: 09/06/15 10:33 PM  Result Value Ref Range   Sodium 142 135 - 145 mmol/L   Potassium 3.1 (L) 3.5 - 5.1 mmol/L   Chloride 105 101 - 111 mmol/L   CO2 27 22 - 32 mmol/L   Glucose, Bld 110 (H) 65 - 99 mg/dL   BUN 9 6 - 20 mg/dL   Creatinine, Ser 0.58 0.44 - 1.00 mg/dL   Calcium 9.2 8.9 - 10.3 mg/dL   Total Protein 7.8 6.5 - 8.1 g/dL   Albumin 3.5 3.5 - 5.0 g/dL   AST 16 15 - 41 U/L   ALT 11 (L) 14 - 54 U/L   Alkaline Phosphatase 75 38 - 126 U/L   Total Bilirubin 0.5 0.3 - 1.2 mg/dL  GFR calc non Af Amer >60 >60 mL/min   GFR calc Af Amer >60 >60 mL/min    Comment: (NOTE) The eGFR has been calculated using the CKD EPI equation. This calculation has not been validated in all clinical situations. eGFR's persistently <60 mL/min signify possible Chronic Kidney Disease.    Anion gap 10 5 - 15  Ethanol (ETOH)     Status: None   Collection Time: 09/06/15 10:33 PM  Result Value Ref Range   Alcohol, Ethyl (B) <5 <5 mg/dL    Comment:        LOWEST DETECTABLE LIMIT FOR SERUM ALCOHOL IS 5 mg/dL FOR MEDICAL PURPOSES ONLY   Salicylate level     Status: None   Collection Time: 09/06/15 10:33 PM  Result Value Ref Range   Salicylate Lvl <9.5 2.8 - 30.0 mg/dL  Acetaminophen level     Status: Abnormal   Collection Time: 09/06/15 10:33 PM  Result Value Ref Range   Acetaminophen (Tylenol), Serum <10 (L) 10 - 30 ug/mL    Comment:        THERAPEUTIC CONCENTRATIONS VARY SIGNIFICANTLY. A RANGE OF 10-30 ug/mL MAY BE AN EFFECTIVE CONCENTRATION FOR MANY PATIENTS. HOWEVER,  SOME ARE BEST TREATED AT CONCENTRATIONS OUTSIDE THIS RANGE. ACETAMINOPHEN CONCENTRATIONS >150 ug/mL AT 4 HOURS AFTER INGESTION AND >50 ug/mL AT 12 HOURS AFTER INGESTION ARE OFTEN ASSOCIATED WITH TOXIC REACTIONS.   CBC     Status: Abnormal   Collection Time: 09/06/15 10:33 PM  Result Value Ref Range   WBC 7.7 4.0 - 10.5 K/uL   RBC 4.49 3.87 - 5.11 MIL/uL   Hemoglobin 13.5 12.0 - 15.0 g/dL   HCT 41.7 36.0 - 46.0 %   MCV 92.9 78.0 - 100.0 fL   MCH 30.1 26.0 - 34.0 pg   MCHC 32.4 30.0 - 36.0 g/dL   RDW 16.1 (H) 11.5 - 15.5 %   Platelets 391 150 - 400 K/uL  Urine rapid drug screen (hosp performed) (Not at [facility])     Status: None   Collection Time: 09/06/15 10:49 PM  Result Value Ref Range   Opiates NONE DETECTED NONE DETECTED   Cocaine NONE DETECTED NONE DETECTED   Benzodiazepines NONE DETECTED NONE DETECTED   Amphetamines NONE DETECTED NONE DETECTED   Tetrahydrocannabinol NONE DETECTED NONE DETECTED   Barbiturates NONE DETECTED NONE DETECTED    Comment:        DRUG SCREEN FOR MEDICAL PURPOSES ONLY.  IF CONFIRMATION IS NEEDED FOR ANY PURPOSE, NOTIFY LAB WITHIN 5 DAYS.        LOWEST DETECTABLE LIMITS FOR URINE DRUG SCREEN Drug Class       Cutoff (ng/mL) Amphetamine      1000 Barbiturate      200 Benzodiazepine   284 Tricyclics       132 Opiates          300 Cocaine          300 THC              50     Current Facility-Administered Medications  Medication Dose Route Frequency Provider Last Rate Last Dose  . albuterol (PROVENTIL HFA;VENTOLIN HFA) 108 (90 Base) MCG/ACT inhaler 2 puff  2 puff Inhalation Q6H PRN [place] Lions, PA-C      . ARIPiprazole (ABILIFY) tablet 5 mg  5 mg Oral QHS [place] Lions, PA-C   5 mg at 09/07/15 0113  . diltiazem (CARDIZEM CD) 24 hr capsule 120 mg  120 mg Oral  Daily [place] Lions, PA-C   120 mg at 09/07/15 0113  . diphenhydrAMINE (BENADRYL) capsule 50 mg  50 mg Oral Q6H PRN Boykin Nearing, MD   50 mg at 01/06/12 1458   . flecainide (TAMBOCOR) tablet 100 mg  100 mg Oral Q12H [place] Lions, PA-C   100 mg at 09/07/15 1056  . pantoprazole (PROTONIX) EC tablet 40 mg  40 mg Oral Daily [place] Lions, PA-C   40 mg at 09/07/15 1056  . potassium chloride SA (K-DUR,KLOR-CON) CR tablet 40 mEq  40 mEq Oral Daily [place] Lions, PA-C   40 mEq at 09/07/15 1056  . rivaroxaban (XARELTO) tablet 20 mg  20 mg Oral QPC supper [place] Lions, PA-C   20 mg at 09/07/15 0113  . senna-docusate (Senokot-S) tablet 1 tablet  1 tablet Oral QHS PRN [place] Lions, PA-C      . Derrill Memo ON 09/08/2015] sertraline (ZOLOFT) tablet 100 mg  100 mg Oral Daily Treesa Mccully, MD      . traZODone (DESYREL) tablet 100 mg  100 mg Oral QHS Corena Pilgrim, MD       Current Outpatient Prescriptions  Medication Sig Dispense Refill  . albuterol (PROVENTIL HFA;VENTOLIN HFA) 108 (90 BASE) MCG/ACT inhaler Inhale 2 puffs into the lungs every 6 (six) hours as needed. For shortness of breath    . albuterol (PROVENTIL) (2.5 MG/3ML) 0.083% nebulizer solution Take 3 mLs (2.5 mg total) by nebulization every 6 (six) hours as needed. For shortness of breath 75 mL 12  . ARIPiprazole (ABILIFY) 5 MG tablet Take 1 tablet (5 mg total) by mouth at bedtime. 60 tablet 3  . budesonide-formoterol (SYMBICORT) 160-4.5 MCG/ACT inhaler Inhale 2 puffs into the lungs 2 (two) times daily.      Marland Kitchen dexlansoprazole (DEXILANT) 60 MG capsule Take 60 mg by mouth daily as needed (for heartburn/acid reflux.).     Marland Kitchen diltiazem (CARDIZEM CD) 120 MG 24 hr capsule Take 1 capsule (120 mg total) by mouth daily. 90 capsule 3  . flecainide (TAMBOCOR) 100 MG tablet Take 1 tablet (100 mg total) by mouth every 12 (twelve) hours. 180 tablet 3  . potassium chloride SA (K-DUR,KLOR-CON) 20 MEQ tablet Take 2 tablets (40 mEq total) by mouth daily. 180 tablet 3  . senna-docusate (SENOKOT-S) 8.6-50 MG per tablet Take 1 tablet by mouth at bedtime as needed for mild constipation.  30 tablet 3  . sertraline (ZOLOFT) 50 MG tablet Take 1 tablet by mouth daily.  0  . traZODone (DESYREL) 100 MG tablet Take 50-100 mg by mouth at bedtime as needed for sleep.     Alveda Reasons 20 MG TABS tablet Take 20 mg by mouth daily after supper.  1    Musculoskeletal: Strength & Muscle Tone: within normal limits Gait & Station: normal Patient leans: N/A  Psychiatric Specialty Exam: Review of Systems  Constitutional: Negative.   HENT: Negative.   Eyes: Negative.   Respiratory: Negative.   Cardiovascular: Negative.   Gastrointestinal: Negative.   Genitourinary: Negative.   Musculoskeletal: Negative.   Skin: Negative.   Neurological: Negative.   Endo/Heme/Allergies: Negative.   Psychiatric/Behavioral: Positive for depression and suicidal ideas. The patient has insomnia.     Blood pressure 118/80, pulse 86, temperature 97.9 F (36.6 C), temperature source Oral, resp. rate 16, height 5' 5"  (1.651 m), weight 104.781 kg (231 lb), SpO2 99 %.Body mass index is 38.44 kg/(m^2).  General Appearance: Disheveled  Eye Contact::  Poor  Speech:  Slow  Volume:  Decreased  Mood:  Depressed  Affect:  Congruent  Thought Process:  Goal Directed, Linear and Logical  Orientation:  Full (Time, Place, and Person)  Thought Content:  WDL  Suicidal Thoughts:  Yes.  without intent/plan  Homicidal Thoughts:  No  Memory:  Immediate;   Fair Recent;   Fair Remote;   Fair  Judgement:  Poor  Insight:  Fair  Psychomotor Activity:  Decreased  Concentration:  Fair  Recall:  [employer] of Knowledge:Fair  Language: Fair  Akathisia:  No  Handed:  Right  AIMS (if indicated):     Assets:  Communication Skills Desire for Improvement Housing Physical Health Social Support  ADL's:  Intact  Cognition: WNL  Sleep:      Treatment Plan Summary: Daily contact with patient to assess and evaluate symptoms and progress in treatment, Medication management and Plan : Major Depressive Disorder, recurrent severe,  without psychotic features - Crisis Stabilization - Individual Counseling - Medication:  Start:  Zoloft 153m daily for mood stabilization/depression  Abilify 576mdaily for mood stabilization  Trazodone 20076mHS for insomnia  Disposition: Recommend psychiatric Inpatient admission when medically cleared.  LORWaylan BogaP 09/07/2015 12:32 PM Patient seen face-to-face for psychiatric evaluation, chart reviewed and case discussed with the physician extender and developed treatment plan. Reviewed the information documented and agree with the treatment plan. MojCorena PilgrimD

## 2015-09-08 ENCOUNTER — Encounter (HOSPITAL_COMMUNITY): Payer: Self-pay

## 2015-09-08 DIAGNOSIS — R45851 Suicidal ideations: Secondary | ICD-10-CM

## 2015-09-08 LAB — LIPID PANEL
CHOL/HDL RATIO: 2.4 ratio
Cholesterol: 205 mg/dL — ABNORMAL HIGH (ref 0–200)
HDL: 87 mg/dL (ref >40)
LDL CALC: 102 mg/dL — AB (ref 0–99)
TRIGLYCERIDES: 81 mg/dL (ref <150)
VLDL: 16 mg/dL (ref 0–40)

## 2015-09-08 LAB — COMPREHENSIVE METABOLIC PANEL
ALT: 10 U/L — ABNORMAL LOW (ref 14–54)
ANION GAP: 11 (ref 5–15)
AST: 15 U/L (ref 15–41)
Albumin: 3.7 g/dL (ref 3.5–5.0)
Alkaline Phosphatase: 74 U/L (ref 38–126)
BILIRUBIN TOTAL: 0.5 mg/dL (ref 0.3–1.2)
BUN: 18 mg/dL (ref 6–20)
CO2: 26 mmol/L (ref 22–32)
Calcium: 9.6 mg/dL (ref 8.9–10.3)
Chloride: 107 mmol/L (ref 101–111)
Creatinine, Ser: 0.64 mg/dL (ref 0.44–1.00)
Glucose, Bld: 99 mg/dL (ref 65–99)
POTASSIUM: 3.7 mmol/L (ref 3.5–5.1)
Sodium: 144 mmol/L (ref 135–145)
TOTAL PROTEIN: 8 g/dL (ref 6.5–8.1)

## 2015-09-08 LAB — TSH: TSH: 2.27 u[IU]/mL (ref 0.350–4.500)

## 2015-09-08 LAB — MAGNESIUM: MAGNESIUM: 1.9 mg/dL (ref 1.7–2.4)

## 2015-09-08 MED ORDER — MIRTAZAPINE 15 MG PO TABS
15.0000 mg | ORAL_TABLET | Freq: Every day | ORAL | Status: DC
  Administered 2015-09-08 – 2015-09-15 (×8): 15 mg via ORAL
  Filled 2015-09-08 (×11): qty 1

## 2015-09-08 MED ORDER — ACETAMINOPHEN 325 MG PO TABS
650.0000 mg | ORAL_TABLET | Freq: Four times a day (QID) | ORAL | Status: DC | PRN
Start: 2015-09-08 — End: 2015-09-16
  Administered 2015-09-08 – 2015-09-16 (×8): 650 mg via ORAL
  Filled 2015-09-08 (×8): qty 2

## 2015-09-08 MED ORDER — MOMETASONE FURO-FORMOTEROL FUM 200-5 MCG/ACT IN AERO: 2.0000 | INHALATION_SPRAY | Freq: Two times a day (BID) | RESPIRATORY_TRACT | Status: DC

## 2015-09-08 MED ORDER — SERTRALINE HCL 100 MG PO TABS
100.0000 mg | ORAL_TABLET | Freq: Every day | ORAL | Status: DC
  Administered 2015-09-09 – 2015-09-15 (×7): 100 mg via ORAL
  Filled 2015-09-08 (×11): qty 1

## 2015-09-08 NOTE — BHH Group Notes (Signed)
[facility] LCSW Group Therapy 09/08/2015 1:15pm  Type of Therapy: Group Therapy- Balance in Life  Participation Level: Active   Description of the Group:  The topic for group was balance in life. Today's group focused on defining balance in one's own words, identifying things that can knock one off balance, and exploring healthy ways to maintain balance in life. Group members were asked to provide an example of a time when they felt off balance, describe how they handled that situation,and process healthier ways to regain balance in the future. Group members were asked to share the most important tool for maintaining balance that they learned while at [facility] and how they plan to apply this method after discharge.  Summary of Patient Progress Pt discussed balance in life in the context of flexibility, describing the need to be able to decide which aspects of life required more energy and effort. Pt participated in group activity and further described balance as the need to take life's challenges "in stride" and use them as catalysts for progress.    Therapeutic Modalities:   Cognitive Behavioral Therapy Solution-Focused Therapy Assertiveness Training   Peri Maris, Latanya Presser 09/08/2015 3:25 PM

## 2015-09-08 NOTE — Progress Notes (Signed)
Recreation Therapy Notes  Animal-Assisted Activity (AAA) Program Checklist/Progress Notes Patient Eligibility Criteria Checklist & Daily Group note for Rec Tx Intervention  Date: 03.14.2017 Time: 2:45am Location: [address]    AAA/T Program Assumption of Risk Form signed by Patient/ or Parent Legal Guardian yes  Patient is free of allergies or sever asthma yes  Patient reports no fear of animals yes  Patient reports no history of cruelty to animals yes  Patient understands his/her participation is voluntary yes  Behavioral Response: Did not attend.   Laureen Ochs Jann Milkovich, LRT/CTRS       Giuseppe Duchemin L 09/08/2015 3:13 PM

## 2015-09-08 NOTE — Progress Notes (Signed)
Patient ID: [name] White, female   DOB: 1956/04/15, 60 y.o.   MRN: [id number] Admission note: D:Patient is a voluntary admission in no acute distress for depression and suicidal ideation without a plan. Pt repots stressor as "being sick and homeless". Pt reports living with a church member who is wheelchair bound. Pt reports taking over the responsibilities for her friend which is taking a toll on her. Pt reports receiving medication assistance from [facility] but reports medication has not been effective. Pt denies SI/HI/AVH  A: Pt admitted to unit per protocol, skin assessment and belonging search done. No skin issues noted. Consent signed by pt. Pt educated on therapeutic milieu rules. Pt was introduced to milieu by nursing staff. Fall risk safety plan explained to the patient. 15 minutes checks started for safety.  R: Pt was receptive to education. Writer offered support.

## 2015-09-08 NOTE — BHH Group Notes (Signed)
[facility] Group Notes:  (Nursing/MHT/Case Management/Adjunct)  Date:  09/08/2015  Time:  0900 am  Type of Therapy:  Psychoeducational Skills  Participation Level:  Minimal  Participation Quality:  Appropriate  Affect:  Appropriate  Cognitive:  Alert and Appropriate  Insight:  Limited  Engagement in Group:  Resistant  Modes of Intervention:  Support  Summary of Progress/Problems: Patient new to unit.  Came into group late; minimal participation.  Zipporah Plants 09/08/2015, 4:22 PM

## 2015-09-08 NOTE — Tx Team (Signed)
Initial Interdisciplinary Treatment Plan   PATIENT STRESSORS: Financial difficulties Health problems   PATIENT STRENGTHS: Ability for insight Average or above average intelligence General fund of knowledge Motivation for treatment/growth   PROBLEM LIST: Problem List/Patient Goals Date to be addressed Date deferred Reason deferred Estimated date of resolution  depression 09/07/2015     anxiety 09/07/2015     Risk for suicide 09/07/2015     "calming down and think more clearly" 09/07/2015     "have plan to improve quality of my life" 09/07/2015                              DISCHARGE CRITERIA:  Improved stabilization in mood, thinking, and/or behavior Medical problems require only outpatient monitoring Need for constant or close observation no longer present Verbal commitment to aftercare and medication compliance  PRELIMINARY DISCHARGE PLAN: Attend aftercare/continuing care group Participate in family therapy Placement in alternative living arrangements Return to previous living arrangement  PATIENT/FAMIILY INVOLVEMENT: This treatment plan has been presented to and reviewed with the patient, SHEILAH LINK,  The patient and family have been given the opportunity to ask questions and make suggestions.  JEHU-APPIAH, Perley Arthurs K 09/08/2015, 2:05 AM

## 2015-09-08 NOTE — H&P (Signed)
Psychiatric Admission Assessment Adult  Patient Identification: [name] White  MRN:  [id number]  Date of Evaluation:  09/08/2015  Chief Complaint: Worsening symptoms of depression & suicidal ideations  Principal Diagnosis: MDD (major depressive disorder), recurrent episode, severe ([place])   Diagnosis:   Patient Active Problem List   Diagnosis Date Noted  . MDD (major depressive disorder), recurrent episode, severe ([place]) [F33.2] 09/07/2015  . Atrial flutter ([place]) [I48.92] 07/29/2015  . History of atrial flutter [Z86.79] 06/26/2015  . Chest pain [R07.9] 03/22/2015  . SOB (shortness of breath) [R06.02]   . MDD (major depressive disorder), recurrent severe, without psychosis ([place]) [F33.2] 03/05/2014  . Pulmonary embolism ([place]) [I26.99] 12/27/2013  . Vocal cord dysfunction [J38.3] 11/12/2013  . Mixed headache [R51] 01/06/2012  . OVERACTIVE BLADDER [N31.8] 04/18/2008  . Osteoarthritis of both knees [M17.0] 04/18/2008  . POLYARTHRITIS [M13.0] 09/14/2007  . Obesity [E66.9] 08/24/2006  . RESTLESS LEGS SYNDROME [G25.89] 08/24/2006  . HYPERTENSION, BENIGN SYSTEMIC [I10] 08/24/2006  . RHINITIS, ALLERGIC [J30.9] 08/24/2006  . Asthma [J45.909] 08/24/2006  . GASTROESOPHAGEAL REFLUX, NO ESOPHAGITIS [K21.9] 08/24/2006   History of Present Illness: This is the second psychiatric admission in this [facility] for this 19 year of AA female. She was a patient in this hospital in 2010 after a suicide attempt by an overdose. She reports during this assessment that she has also been to the [facility] in October, 2015 & [facility] in March of 2016 all related to worsening symptoms of depression & anxiety. During this assessment; [name] White reports, "My daughter called the EMS on Sunday, 2 days ago. I had an emotional breakdown due to my depression & anxiety. I have been depressed since 2010 due to chemical imbalance, I was told. This is my 4th -psychiatric admission due to my bad depression. This is my 2nd  admission to this hospital. I was in this hospital in 2010 after an overdose attempt. I have also been hospitalized at the [facility]. I normally will feel better after hospitalization, then, because of my frame of mind & circumstances, I will get very depressed again. The suicidal ideations started last month. I did not attempt or had a plan to hurt myself. I took an overdose of pills in 2010 to kill myself, it was Tylenol PM. I was hospital & given charcoal. I have been on Sertraline 50 mg x 2 years. I also take Abilify. I believe the 2 medicines are not effective in managing my depression. I need medication adjustment. I go [facility] for mental health care".  Associated Signs/Symptoms:  Depression Symptoms:  depressed mood, insomnia, feelings of worthlessness/guilt, hopelessness, suicidal thoughts without plan, anxiety, loss of energy/fatigue,  (Hypo) Manic Symptoms:  Patient deneis any hypomanic episodes or symptoms  Anxiety Symptoms:  Excessive Worry,  Psychotic Symptoms:  Patient denies any psychotic symptoms  PTSD Symptoms: None reported  Total Time spent with patient: 1 hour  Past Psychiatric History: Major depressive disorder, recurrent episodes.  Is the patient at risk to self? No.  Has the patient been a risk to self in the past 6 months? No.  Has the patient been a risk to self within the distant past? No.  Is the patient a risk to others? No.  Has the patient been a risk to others in the past 6 months? No.  Has the patient been a risk to others within the distant past? No.   Prior Inpatient Therapy: Yes, ([place], [place], [facility], [place]) Prior Outpatient Therapy: Yes [facility])  Alcohol  Screening: 1. How often do you have a drink containing alcohol?: Never 9. Have you or someone else been injured as a result of your drinking?: No 10. Has a relative or friend or a doctor or another health worker been concerned about your drinking or suggested you cut down?: No Alcohol  Use Disorder Identification Test Final Score (AUDIT): 0 Brief Intervention: AUDIT score less than 7 or less-screening does not suggest unhealthy drinking-brief intervention not indicated  Substance Abuse History in the last 12 months:  No.  Consequences of Substance Abuse: Denies any use of drugs or other substances  Previous Psychotropic Medications: Yes (Sertraline, Abilify)  Psychological Evaluations: Yes   Past Medical History:  Past Medical History  Diagnosis Date  . Asthma   . Hypertension   . GERD (gastroesophageal reflux disease)   . Anxiety   . Depression   . Family history of anesthesia complication     "daughter; causes her to pass out afterwards"  . Pulmonary embolism ([place]) 12/28/2013    "2 clots in each lung"  . Hyperthyroidism   . Migraine     "monthly" (12/28/2013)  . Osteoarthritis     "both knees; back of my neck; right pelvic bone" (12/28/2013)  . Chronic lower back pain   . Paroxysmal a-fib [facility])     Past Surgical History  Procedure Laterality Date  . Abdominal hysterectomy    . Wisdom tooth extraction    . Thyroidectomy, partial Right 2005  . Nasal sinus surgery  2007  . Appendectomy    . Excisional hemorrhoidectomy    . Dilation and curettage of uterus    . Tubal ligation    . Breast cyst excision Right   . Electrophysiologic study N/A 05/27/2015    Procedure: A-Flutter;  Surgeon: Evans Lance, MD;  Location: [place] [facility];  Service: Cardiovascular;  Laterality: N/A;   Family History:  Family History  Problem Relation Age of Onset  . Osteoarthritis Mother   . Asthma Mother   . Heart failure Mother    Family Psychiatric  History: Major depressive disorder: mother  Tobacco Screening: Denies any smoking or use tobacco products.  Social History:  History  Alcohol Use  . Yes    Comment: "drank some in my 30's"     History  Drug Use No    Additional Social History:  Allergies:   Allergies  Allergen Reactions  . Caffeine Other (See  Comments)    Migraine  . Vicodin [Hydrocodone-Acetaminophen] Nausea And Vomiting and Other (See Comments)    Headache   . Ciprofloxacin Hives  . Erythromycin Hives  . Lisinopril Cough  . Oxycodone Nausea And Vomiting    headache  . Sulfamethoxazole-Trimethoprim Rash   Lab Results:  Results for orders placed or performed during the hospital encounter of 09/07/15 (from the past 48 hour(s))  TSH     Status: None   Collection Time: 09/08/15  6:55 AM  Result Value Ref Range   TSH 2.270 0.350 - 4.500 uIU/mL    Comment: Performed at [facility]  Magnesium     Status: None   Collection Time: 09/08/15  6:55 AM  Result Value Ref Range   Magnesium 1.9 1.7 - 2.4 mg/dL    Comment: Performed at [facility]  Comprehensive metabolic panel     Status: Abnormal   Collection Time: 09/08/15  6:55 AM  Result Value Ref Range   Sodium 144 135 - 145 mmol/L   Potassium  3.7 3.5 - 5.1 mmol/L   Chloride 107 101 - 111 mmol/L   CO2 26 22 - 32 mmol/L   Glucose, Bld 99 65 - 99 mg/dL   BUN 18 6 - 20 mg/dL   Creatinine, Ser 0.64 0.44 - 1.00 mg/dL   Calcium 9.6 8.9 - 10.3 mg/dL   Total Protein 8.0 6.5 - 8.1 g/dL   Albumin 3.7 3.5 - 5.0 g/dL   AST 15 15 - 41 U/L   ALT 10 (L) 14 - 54 U/L   Alkaline Phosphatase 74 38 - 126 U/L   Total Bilirubin 0.5 0.3 - 1.2 mg/dL   GFR calc non Af Amer >60 >60 mL/min   GFR calc Af Amer >60 >60 mL/min    Comment: (NOTE) The eGFR has been calculated using the CKD EPI equation. This calculation has not been validated in all clinical situations. eGFR's persistently <60 mL/min signify possible Chronic Kidney Disease.    Anion gap 11 5 - 15    Comment: Performed at [facility]   Blood Alcohol level:  Lab Results  Component Value Date   [facility] <5 09/06/2015   ETH <11 63/84/5364   Metabolic Disorder Labs:  Lab Results  Component Value Date   HGBA1C 6.2* 03/22/2015   MPG 131 03/22/2015   No results found for:  PROLACTIN Lab Results  Component Value Date   CHOL 171 03/23/2015   TRIG 40 03/23/2015   HDL 72 03/23/2015   CHOLHDL 2.4 03/23/2015   VLDL 8 03/23/2015   LDLCALC 91 03/23/2015   LDLCALC 110* 05/08/2013   Current Medications: Current Facility-Administered Medications  Medication Dose Route Frequency Provider Last Rate Last Dose  . albuterol (PROVENTIL HFA;VENTOLIN HFA) 108 (90 Base) MCG/ACT inhaler 2 puff  2 puff Inhalation Q6H PRN Laverle Hobby, PA-C      . albuterol (PROVENTIL) (2.5 MG/3ML) 0.083% nebulizer solution 2.5 mg  2.5 mg Nebulization Q2H PRN Laverle Hobby, PA-C      . alum & mag hydroxide-simeth (MAALOX/MYLANTA) 200-200-20 MG/5ML suspension 30 mL  30 mL Oral Q4H PRN Laverle Hobby, PA-C      . ARIPiprazole (ABILIFY) tablet 5 mg  5 mg Oral QHS Laverle Hobby, PA-C   5 mg at 09/07/15 2327  . diltiazem (CARDIZEM CD) 24 hr capsule 120 mg  120 mg Oral Daily Laverle Hobby, PA-C   120 mg at 09/08/15 0809  . flecainide (TAMBOCOR) tablet 100 mg  100 mg Oral Q12H Laverle Hobby, PA-C   100 mg at 09/08/15 6803  . hydrOXYzine (ATARAX/VISTARIL) tablet 25 mg  25 mg Oral Q6H PRN Laverle Hobby, PA-C      . magnesium hydroxide (MILK OF MAGNESIA) suspension 30 mL  30 mL Oral Daily PRN Laverle Hobby, PA-C      . mometasone-formoterol (DULERA) 200-5 MCG/ACT inhaler 2 puff  2 puff Inhalation BID Laverle Hobby, PA-C   2 puff at 09/08/15 0810  . pantoprazole (PROTONIX) EC tablet 40 mg  40 mg Oral Daily Laverle Hobby, PA-C   40 mg at 09/08/15 0809  . potassium chloride SA (K-DUR,KLOR-CON) CR tablet 40 mEq  40 mEq Oral Daily Laverle Hobby, PA-C   40 mEq at 09/08/15 0809  . rivaroxaban (XARELTO) tablet 20 mg  20 mg Oral QPC supper Laverle Hobby, PA-C      . senna-docusate (Senokot-S) tablet 1 tablet  1 tablet Oral QHS PRN Laverle Hobby, PA-C      .  sertraline (ZOLOFT) tablet 50 mg  50 mg Oral Daily Laverle Hobby, PA-C   50 mg at 09/08/15 0809  . traZODone (DESYREL) tablet 200 mg   200 mg Oral QHS PRN Laverle Hobby, PA-C   200 mg at 09/07/15 2327   PTA Medications: Prescriptions prior to admission  Medication Sig Dispense Refill Last Dose  . ARIPiprazole (ABILIFY) 5 MG tablet Take 1 tablet (5 mg total) by mouth at bedtime. 60 tablet 3 09/07/2015 at Unknown time  . diltiazem (CARDIZEM CD) 120 MG 24 hr capsule Take 1 capsule (120 mg total) by mouth daily. 90 capsule 3 09/07/2015 at Unknown time  . flecainide (TAMBOCOR) 100 MG tablet Take 1 tablet (100 mg total) by mouth every 12 (twelve) hours. 180 tablet 3 09/07/2015 at Unknown time  . potassium chloride SA (K-DUR,KLOR-CON) 20 MEQ tablet Take 2 tablets (40 mEq total) by mouth daily. 180 tablet 3 09/07/2015 at Unknown time  . sertraline (ZOLOFT) 50 MG tablet Take 1 tablet by mouth daily.  0 09/07/2015 at Unknown time  . traZODone (DESYREL) 100 MG tablet Take 50-100 mg by mouth at bedtime as needed for sleep.    09/07/2015 at Unknown time  . XARELTO 20 MG TABS tablet Take 20 mg by mouth daily after supper.  1 09/07/2015 at Unknown time  . albuterol (PROVENTIL HFA;VENTOLIN HFA) 108 (90 BASE) MCG/ACT inhaler Inhale 2 puffs into the lungs every 6 (six) hours as needed. For shortness of breath   Unknown at Unknown time  . albuterol (PROVENTIL) (2.5 MG/3ML) 0.083% nebulizer solution Take 3 mLs (2.5 mg total) by nebulization every 6 (six) hours as needed. For shortness of breath 75 mL 12 Unknown at Unknown time  . budesonide-formoterol (SYMBICORT) 160-4.5 MCG/ACT inhaler Inhale 2 puffs into the lungs 2 (two) times daily.     Unknown at Unknown time  . dexlansoprazole (DEXILANT) 60 MG capsule Take 60 mg by mouth daily as needed (for heartburn/acid reflux.).    unknown  . senna-docusate (SENOKOT-S) 8.6-50 MG per tablet Take 1 tablet by mouth at bedtime as needed for mild constipation. 30 tablet 3 Unknown at Unknown time   Musculoskeletal: Strength & Muscle Tone: within normal limits Gait & Station: normal Patient leans:  N/A  Psychiatric Specialty Exam: Physical Exam  Constitutional: She is oriented to person, place, and time. She appears well-developed and well-nourished.  HENT:  Head: Normocephalic.  Eyes: Pupils are equal, round, and reactive to light.  Neck: Normal range of motion.  Cardiovascular: Normal rate.   Hx. HTN  Respiratory: Effort normal.  Hx. Asthma  GI: Soft.  Genitourinary:  Denies any issues in this area  Musculoskeletal: Normal range of motion.  Hx. Arthritis, currently uses walker to aid mobility & balance  Neurological: She is alert and oriented to person, place, and time.  Skin: Skin is warm and dry.  Psychiatric: Her speech is normal and behavior is normal. Judgment normal. Her mood appears anxious. Her affect is not angry, not blunt, not labile and not inappropriate. Cognition and memory are normal. She exhibits a depressed mood. She expresses suicidal ideation. She expresses no suicidal plans.    Review of Systems  Constitutional: Negative.   HENT: Negative.   Eyes: Negative.   Respiratory: Negative.        Hx. Asthma  Cardiovascular: Negative.        Hx. High blood pressure  Gastrointestinal: Negative.        Hx. Acid reflux  Genitourinary: Negative.  Musculoskeletal: Negative.   Skin: Negative.   Neurological: Negative.   Endo/Heme/Allergies: Negative.   Psychiatric/Behavioral: Positive for depression and suicidal ideas (Denies any plans or intent). Negative for hallucinations, memory loss and substance abuse. The patient is nervous/anxious and has insomnia.     Blood pressure 109/66, pulse 124, temperature 98.3 F (36.8 C), temperature source Oral, resp. rate 16, height 5' 5.5" (1.664 m), weight 104.327 kg (230 lb).Body mass index is 37.68 kg/(m^2).  General Appearance: Casual and Fairly Groomed  Engineer, water::  Good  Speech:  Clear and Coherent  Volume:  Normal  Mood:  Anxious and Depressed  Affect:  Appropriate  Thought Process:  Coherent and Intact   Orientation:  Full (Time, Place, and Person)  Thought Content:  Rumination, denies any hallucinations, delusions or paranoia  Suicidal Thoughts:  Yes, fleeting thoughts, denies any plans or intent, no hx of suicide attempts  Homicidal Thoughts:  No  Memory:  Immediate;   Good Recent;   Good Remote;   Good  Judgement:  Fair  Insight:  Fair  Psychomotor Activity:  Decreased  Concentration:  Good  Recall:  Good  Fund of Knowledge:Good  Language: Good  Akathisia:  No  Handed:  Right  AIMS (if indicated):     Assets:  Communication Skills Desire for Improvement  ADL's:  Intact  Cognition: WNL  Sleep:  Number of Hours: 5   Treatment Plan/Recommendations: 1. Admit for crisis management and stabilization, estimated length of stay 3-5 days.  2. Medication management to reduce current symptoms to base line and improve the patient's overall level of functioning;  Abilify 5 mg for mood control, Hydroxyzine 25 mg for anxiety, Sertraline 50 mg for depression, Trazodone 200 mg for insomnia.  3. Treat health problems as indicated;   4. Develop treatment plan to decrease risk of & the need for readmission.  5. Psycho-social education regarding relapse prevention and self care.  6. Health care follow up as needed for medical problems.  7. Review, reconcile, and reinstate any pertinent home medications for other health issues where appropriate;Albuterol 2.5/3 mg for SOB, prn, Cardizem CD 120 mg for HTN, TAMBOCORT 100 mg. Dulera 200-5 mcg/ACT for SOB, Protonix 40 mg for GERD, Kdur CR 40 meq, Xarelto 20 mg for PAD, Senna-docucet-s for constipation    8. Call for consults with hospitalist for any additional specialty patient care services as needed.  Observation Level/Precautions:  15 minute checks  Laboratory:  Per ED  Psychotherapy: Group sessions   Medications: Abilify 5 mg for mood control, Hydroxyzine 25 mg for anxiety, Sertraline 50 mg for depression, Trazodone 200 mg for insomnia, Albuterol  2.5/3 mg for SOB, prn, Cardizem CD 120 mg for HTN, TAMBOCORT 100 mg. Dulera 200-5 mcg/ACT for SOB, Protonix 40 mg for GERD, Kdur CR 40 meq, Xarelto 20 mg for PAD, Senna-docucet-s for constipation  Consultations: As needed   Discharge Concerns: Safety, Mood stabilization   Estimated LOS: 3-5 days  Other:  Admit to [id number]   I certify that inpatient services furnished can reasonably be expected to improve the patient's condition.    Encarnacion Slates, NP, PMHNP-BC 3/14/201710:54 AM I personally assessed the patient, reviewed the physical exam and labs and formulated the treatment plan Geralyn Flash A. Sabra Heck, M.D.

## 2015-09-08 NOTE — Progress Notes (Signed)
Adult Psychoeducational Group Note  Date:  09/08/2015 Time:  9:01 PM  Group Topic/Focus:  Wrap-Up Group:   The focus of this group is to help patients review their daily goal of treatment and discuss progress on daily workbooks.  Participation Level:  Active  Participation Quality:  Appropriate  Affect:  Appropriate  Cognitive:  Alert  Insight: Appropriate  Engagement in Group:  Engaged  Modes of Intervention:  Discussion  Additional Comments:  Patient goal for today was to reduce the number of negative thoughts and think of solutions. Patient stated "I had a better day today then I have been". On a scale between 1-10, (1=worse, 10=best) patient rated her day a 4 because of arthritidis pain.   Shron Ozer L Akirah Storck 09/08/2015, 9:01 PM

## 2015-09-08 NOTE — Progress Notes (Signed)
D: Patient presents with flat affect; depressed mood.  She is pleasant and cooperative.  Patient is ambulating with a walker due to unsteadiness on her feet.  She rates her depression, anxiety and hopelessness as an 8.  Her goal is to "reduce negative thoughts and feeling upset."  Patient is interacting well with her peers.  She was offered a hospital bed, however, states she is fine with the pillows propped up on her bed.  Left that option open to patient if needed. A: Continue to monitor medication management and MD orders.  Safety checks completed every 15 minutes per protocol.  Offer support and encouragement as needed. R: Patient is receptive to staff; her behavior is appropriate.
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
## 2015-09-08 NOTE — Tx Team (Signed)
Interdisciplinary Treatment Plan Update (Adult) Date: 09/08/2015    Time Reviewed: 9:30 AM  Progress in Treatment: Attending groups: Continuing to assess, patient new to milieu Participating in groups: Continuing to assess, patient new to milieu Taking medication as prescribed: Yes Tolerating medication: Yes Family/Significant other contact made: No, CSW assessing for appropriate contacts Patient understands diagnosis: Yes Discussing patient identified problems/goals with staff: Yes Medical problems stabilized or resolved: Yes Denies suicidal/homicidal ideation: Yes Issues/concerns per patient self-inventory: Yes Other:  New problem(s) identified: N/A  Discharge Plan or Barriers: CSW continuing to assess, patient new to milieu.  Reason for Continuation of Hospitalization:  Depression Anxiety Medication Stabilization   Comments: N/A  Estimated length of stay: 3-5 days    Patient is a 60 year old female with a diagnosis of Major Depressive Disorder. Pt presented to the hospital with increased depression and suicidal ideations. Pt reports primary trigger(s) for admission were financial, housing, and social stressors. Patient will benefit from crisis stabilization, medication evaluation, group therapy and psycho education in addition to case management for discharge planning. At discharge, it is recommended that Pt remain compliant with established discharge plan and continued treatment.   Review of initial/current patient goals per problem list:  1. Goal(s): Patient will participate in aftercare plan   Met: No   Target date: 3-5 days post admission date   As evidenced by: Patient will participate within aftercare plan AEB aftercare provider and housing plan at discharge being identified.   3/14: Goal not met: CSW assessing for appropriate referrals for pt and will have follow up secured prior to d/c.   2. Goal (s): Patient will exhibit decreased depressive symptoms  and suicidal ideations.   Met: No   Target date: 3-5 days post admission date   As evidenced by: Patient will utilize self rating of depression at 3 or below and demonstrate decreased signs of depression or be deemed stable for discharge by MD.  3/14: Goal not met: Pt presents with flat affect and depressed mood.  Pt admitted with depression rating of 10.  Pt to show decreased sign of depression and a rating of 3 or less before d/c.       3. Goal(s): Patient will demonstrate decreased signs and symptoms of anxiety.   Met: No   Target date: 3-5 days post admission date   As evidenced by: Patient will utilize self rating of anxiety at 3 or below and demonstrated decreased signs of anxiety, or be deemed stable for discharge by MD  3/14: Goal not met: Pt presents with anxious mood and affect.  Pt admitted with anxiety rating of 10.  Pt to show decreased sign of anxiety and a rating of 3 or less before d/c.    Attendees: Patient:    Family:    Physician: Dr. Shea Evans, Dr. Sabra Heck 09/08/2015 9:30 AM  Nursing: Janann August, Mayra Neer, Kandice Moos, RN 09/08/2015 9:30 AM  Clinical Social Worker: Tilden Fossa, LCSW 09/08/2015 9:30 AM  Other: Peri Maris, LCSWA; [place], LCSW  09/08/2015 9:30 AM  Other:  09/08/2015 9:30 AM  Other: Lars Pinks, Case Manager 09/08/2015 9:30 AM  Other: Larose Kells, NP 09/08/2015 9:30 AM  Other:    Other:    Other:    Other:     Scribe for Treatment Team:  Tilden Fossa, [place]

## 2015-09-09 DIAGNOSIS — F332 Major depressive disorder, recurrent severe without psychotic features: Principal | ICD-10-CM

## 2015-09-09 LAB — PROLACTIN: PROLACTIN: 10.8 ng/mL (ref 4.8–23.3)

## 2015-09-09 LAB — HEMOGLOBIN A1C
HEMOGLOBIN A1C: 5.9 % — AB (ref 4.8–5.6)
Mean Plasma Glucose: 123 mg/dL

## 2015-09-09 NOTE — B[facility] Group Notes (Signed)
[place] LCSW Group Therapy 09/09/2015 1:15 PM  Type of Therapy: Group Therapy- Emotion Regulation  Participation Level: Minimal  Participation Quality:  Reserved but Attentive  Affect: Appropriate  Cognitive: Alert and Oriented   Insight:  Developing/Improving  Engagement in Therapy: Developing/Improving and Engaged   Modes of Intervention: Clarification, Confrontation, Discussion, Education, Exploration, Limit-setting, Orientation, Problem-solving, Rapport Building, Art therapist, Socialization and Support  Summary of Progress/Problems: The topic for group today was emotional regulation. This group focused on both positive and negative emotion identification and allowed group members to process ways to identify feelings, regulate negative emotions, and find healthy ways to manage internal/external emotions. Group members were asked to reflect on a time when their reaction to an emotion led to a negative outcome and explored how alternative responses using emotion regulation would have benefited them. Group members were also asked to discuss a time when emotion regulation was utilized when a negative emotion was experienced. Pt identified feelings of hopelessness and difficult to regulate. She was receptive to feedback and was attentive to the discussion of peers.    [name] White, [place] 09/09/2015 3:41 PM

## 2015-09-09 NOTE — [facility] Group Notes (Signed)
[facility] LCSW Aftercare Discharge Planning Group Note  09/09/2015 8:45 AM  Participation Quality: Alert, Appropriate and Oriented  Mood/Affect: Appropriate  Depression Rating: 7  Anxiety Rating: 7  Thoughts of Suicide: Pt denies SI/HI  Will you contract for safety? Yes  Current AVH: Pt denies  Plan for Discharge/Comments: Pt attended discharge planning group and actively participated in group. CSW discussed suicide prevention education with the group and encouraged them to discuss discharge planning and any relevant barriers. Pt reports that she is hopeful that the medication adjustments will be effective. No needs expressed at this time.   Transportation Means: Pt reports access to transportation  Supports: No supports mentioned at this time  Peri Maris, [place] 09/09/2015 9:32 AM

## 2015-09-09 NOTE — [facility]HH Suicide Risk Assessment (Signed)
[place] INPATIENT:  Family/Significant Other Suicide Prevention Education  Suicide Prevention Education:  Contact Attempts: daughter Hillery Hunter [phone], (name of family member/significant other) has been identified by the patient as the family member/significant other with whom the patient will be residing, and identified as the person(s) who will aid the patient in the event of a mental health crisis.  With written consent from the patient, two attempts were made to provide suicide prevention education, prior to and/or following the patient's discharge.  We were unsuccessful in providing suicide prevention education.  A suicide education pamphlet was given to the patient to share with family/significant other.  Date and time of first attempt: 09/09/15 at 11:20am Date and time of second attempt: 09/15/15 at 10:30am Shama Monfils, Casimiro Needle 09/09/2015, 11:20 AM

## 2015-09-09 NOTE — Progress Notes (Addendum)
D: Patient continues to present with flat affect; sad and depressed mood.  She is attending groups, however, elects to stay in her room during down time.  She continues to ambulate with a walker due to arthritis in her knees.  She is taking tylenol for same.  She has no other physical complaints.  She has some passive SI; denies HI/AVH.  She rates her depression, hopelessness and anxiety as a 7. A: Continue to monitor medication management and MD orders.  Safety checks completed every 15 minutes per protocol.  Offer support ane encouragement as needed. R: Patient is receptive to staff; her behavior is appropriate.

## 2015-09-09 NOTE — Plan of Care (Signed)
Problem: Diagnosis: Increased Risk For Suicide Attempt Goal: STG-Patient Will Attend All Groups On The Unit Outcome: Progressing Pt attended and engaged in evening wrap up group     

## 2015-09-09 NOTE — Progress Notes (Signed)
Recreation Therapy Notes  Date: 03.15.2017 Time: 9:30am Location: [address] Group Room   Group Topic: Stress Management  Goal Area(s) Addresses:  Patient will actively participate in stress management techniques presented during session.   Behavioral Response: Did not attend.   [name] White, LRT/CTRS        [name] White L 09/09/2015 1:53 PM 

## 2015-09-09 NOTE — Progress Notes (Signed)
Patient ID: [name] White, female   DOB: [birth year], 60 y.o.   MRN: [id number] D: Patient in room on approach. Mood and affect appeared depressed and flat. Denies SI/HI/AVH.No behavioral issues noted.  A: Support and encouragement offered as needed. Medications administered as prescribed.  R: Patient cooperative and appropriate on unit. Will continue to monitor patient for safety and stability.

## 2015-09-09 NOTE — Progress Notes (Signed)
[facility] MD Progress Note  09/09/2015 10:59 AM STEVEN VEAZIE  MRN:  [id number] Subjective:  Patient reports she is still feeling depressed, sad, although reports some improvement compared to admission . Denies medication side effects at this time. Objective : I have discussed case with treatment team and have met with patient. Patient is a 60 year old female, presented with worsening depression, reports significant psychosocial stressors , to include financial difficulties, due to which she lost her apartment and is currently living with a disabled friend from church. She also describes  a sense of generally declining health over the last year or so.  Today reports ongoing depression, but does state she is feeling better than she did prior to admission- presents depressed, with a constricted, but reactive affect and does smile at times appropriately. Denies medication side effects No disruptive or agitated behaviors on unit . Going to some groups .    Principal Problem: MDD (major depressive disorder), recurrent episode, severe ([place]) Diagnosis:   Patient Active Problem List   Diagnosis Date Noted  . MDD (major depressive disorder), recurrent episode, severe ([place]) [F33.2] 09/07/2015  . Atrial flutter ([place]) [I48.92] 07/29/2015  . History of atrial flutter [Z86.79] 06/26/2015  . Chest pain [R07.9] 03/22/2015  . SOB (shortness of breath) [R06.02]   . MDD (major depressive disorder), recurrent severe, without psychosis ([place]) [F33.2] 03/05/2014  . Pulmonary embolism ([place]) [I26.99] 12/27/2013  . Vocal cord dysfunction [J38.3] 11/12/2013  . Mixed headache [R51] 01/06/2012  . OVERACTIVE BLADDER [N31.8] 04/18/2008  . Osteoarthritis of both knees [M17.0] 04/18/2008  . POLYARTHRITIS [M13.0] 09/14/2007  . Obesity [E66.9] 08/24/2006  . RESTLESS LEGS SYNDROME [G25.89] 08/24/2006  . HYPERTENSION, BENIGN SYSTEMIC [I10] 08/24/2006  . RHINITIS, ALLERGIC [J30.9] 08/24/2006  . Asthma [J45.909] 08/24/2006   . GASTROESOPHAGEAL REFLUX, NO ESOPHAGITIS [K21.9] 08/24/2006   Total Time spent with patient: 25 minutes     Past Medical History:  Past Medical History  Diagnosis Date  . Asthma   . Hypertension   . GERD (gastroesophageal reflux disease)   . Anxiety   . Depression   . Family history of anesthesia complication     "daughter; causes her to pass out afterwards"  . Pulmonary embolism ([place]) 12/28/2013    "2 clots in each lung"  . Hyperthyroidism   . Migraine     "monthly" (12/28/2013)  . Osteoarthritis     "both knees; back of my neck; right pelvic bone" (12/28/2013)  . Chronic lower back pain   . Paroxysmal a-fib [facility])     Past Surgical History  Procedure Laterality Date  . Abdominal hysterectomy    . Wisdom tooth extraction    . Thyroidectomy, partial Right 2005  . Nasal sinus surgery  2007  . Appendectomy    . Excisional hemorrhoidectomy    . Dilation and curettage of uterus    . Tubal ligation    . Breast cyst excision Right   . Electrophysiologic study N/A 05/27/2015    Procedure: A-Flutter;  Surgeon: Evans Lance, MD;  Location: [place] [facility];  Service: Cardiovascular;  Laterality: N/A;   Family History:  Family History  Problem Relation Age of Onset  . Osteoarthritis Mother   . Asthma Mother   . Heart failure Mother     Social History:  History  Alcohol Use  . Yes    Comment: "drank some in my 30's"     History  Drug Use No    Social History   Social  History  . Marital Status: Single    Spouse Name: N/A  . Number of Children: N/A  . Years of Education: N/A   Social History Main Topics  . Smoking status: Former Smoker -- 0.25 packs/day for 20 years    Types: Cigarettes    Quit date: 07/17/1999  . Smokeless tobacco: Never Used  . Alcohol Use: Yes     Comment: "drank some in my 30's"  . Drug Use: No  . Sexual Activity: No   Other Topics Concern  . None   Social History Narrative   Additional Social History:   Sleep:  Good  Appetite:  Good  Current Medications: Current Facility-Administered Medications  Medication Dose Route Frequency Provider Last Rate Last Dose  . acetaminophen (TYLENOL) tablet 650 mg  650 mg Oral Q6H PRN Kerrie Buffalo, NP   650 mg at 09/09/15 0819  . albuterol (PROVENTIL HFA;VENTOLIN HFA) 108 (90 Base) MCG/ACT inhaler 2 puff  2 puff Inhalation Q6H PRN Laverle Hobby, PA-C      . albuterol (PROVENTIL) (2.5 MG/3ML) 0.083% nebulizer solution 2.5 mg  2.5 mg Nebulization Q2H PRN Laverle Hobby, PA-C      . alum & mag hydroxide-simeth (MAALOX/MYLANTA) 200-200-20 MG/5ML suspension 30 mL  30 mL Oral Q4H PRN Laverle Hobby, PA-C      . ARIPiprazole (ABILIFY) tablet 5 mg  5 mg Oral QHS Laverle Hobby, PA-C   5 mg at 09/08/15 2141  . diltiazem (CARDIZEM CD) 24 hr capsule 120 mg  120 mg Oral Daily Laverle Hobby, PA-C   120 mg at 09/09/15 0819  . flecainide (TAMBOCOR) tablet 100 mg  100 mg Oral Q12H Laverle Hobby, PA-C   100 mg at 09/09/15 0092  . hydrOXYzine (ATARAX/VISTARIL) tablet 25 mg  25 mg Oral Q6H PRN Laverle Hobby, PA-C      . magnesium hydroxide (MILK OF MAGNESIA) suspension 30 mL  30 mL Oral Daily PRN Laverle Hobby, PA-C      . mirtazapine (REMERON) tablet 15 mg  15 mg Oral QHS Nicholaus Bloom, MD   15 mg at 09/08/15 2140  . mometasone-formoterol (DULERA) 200-5 MCG/ACT inhaler 2 puff  2 puff Inhalation BID Laverle Hobby, PA-C   2 puff at 09/09/15 [phone]  . pantoprazole (PROTONIX) EC tablet 40 mg  40 mg Oral Daily Laverle Hobby, PA-C   40 mg at 09/09/15 0819  . potassium chloride SA (K-DUR,KLOR-CON) CR tablet 40 mEq  40 mEq Oral Daily Laverle Hobby, PA-C   40 mEq at 09/09/15 0819  . rivaroxaban (XARELTO) tablet 20 mg  20 mg Oral QPC supper Laverle Hobby, PA-C   20 mg at 09/08/15 1810  . senna-docusate (Senokot-S) tablet 1 tablet  1 tablet Oral QHS PRN Laverle Hobby, PA-C      . sertraline (ZOLOFT) tablet 100 mg  100 mg Oral Daily Nicholaus Bloom, MD   100 mg at 09/09/15 7622     Lab Results:  Results for orders placed or performed during the hospital encounter of 09/07/15 (from the past 48 hour(s))  Prolactin     Status: None   Collection Time: 09/08/15  6:55 AM  Result Value Ref Range   Prolactin 10.8 4.8 - 23.3 ng/mL    Comment: (NOTE) Performed At: [facility] [place], [place] [id number] Lindon Romp MD [id number] Performed at [facility]   TSH     Status: None  Collection Time: 09/08/15  6:55 AM  Result Value Ref Range   TSH 2.270 0.350 - 4.500 uIU/mL    Comment: Performed at [facility]  Hemoglobin A1c     Status: Abnormal   Collection Time: 09/08/15  6:55 AM  Result Value Ref Range   Hgb A1c MFr Bld 5.9 (H) 4.8 - 5.6 %    Comment: (NOTE)         Pre-diabetes: 5.7 - 6.4         Diabetes: >6.4         Glycemic control for adults with diabetes: <7.0    Mean Plasma Glucose 123 mg/dL    Comment: (NOTE) Performed At: [facility] [place], [place] [id number] Lindon Romp MD [id number] Performed at [facility]   Lipid panel, fasting     Status: Abnormal   Collection Time: 09/08/15  6:55 AM  Result Value Ref Range   Cholesterol 205 (H) 0 - 200 mg/dL   Triglycerides 81 <150 mg/dL   HDL 87 >40 mg/dL   Total CHOL/HDL Ratio 2.4 RATIO   VLDL 16 0 - 40 mg/dL   LDL Cholesterol 102 (H) 0 - 99 mg/dL    Comment:        Total Cholesterol/HDL:CHD Risk Coronary Heart Disease Risk Table                     Men   Women  1/2 Average Risk   3.4   3.3  Average Risk       5.0   4.4  2 X Average Risk   9.6   7.1  3 X Average Risk  23.4   11.0        Use the calculated Patient Ratio above and the CHD Risk Table to determine the patient's CHD Risk.        ATP III CLASSIFICATION (LDL):  <100     mg/dL   Optimal  100-129  mg/dL   Near or Above                    Optimal  130-159  mg/dL   Borderline  160-189  mg/dL   High  >190      mg/dL   Very High Performed at [facility]   Magnesium     Status: None   Collection Time: 09/08/15  6:55 AM  Result Value Ref Range   Magnesium 1.9 1.7 - 2.4 mg/dL    Comment: Performed at [facility]  Comprehensive metabolic panel     Status: Abnormal   Collection Time: 09/08/15  6:55 AM  Result Value Ref Range   Sodium 144 135 - 145 mmol/L   Potassium 3.7 3.5 - 5.1 mmol/L   Chloride 107 101 - 111 mmol/L   CO2 26 22 - 32 mmol/L   Glucose, Bld 99 65 - 99 mg/dL   BUN 18 6 - 20 mg/dL   Creatinine, Ser 0.64 0.44 - 1.00 mg/dL   Calcium 9.6 8.9 - 10.3 mg/dL   Total Protein 8.0 6.5 - 8.1 g/dL   Albumin 3.7 3.5 - 5.0 g/dL   AST 15 15 - 41 U/L   ALT 10 (L) 14 - 54 U/L   Alkaline Phosphatase 74 38 - 126 U/L   Total Bilirubin 0.5 0.3 - 1.2 mg/dL   GFR calc non Af Amer >60 >60 mL/min   GFR calc Af Amer >60 >  60 mL/min    Comment: (NOTE) The eGFR has been calculated using the CKD EPI equation. This calculation has not been validated in all clinical situations. eGFR's persistently <60 mL/min signify possible Chronic Kidney Disease.    Anion gap 11 5 - 15    Comment: Performed at [facility]    Blood Alcohol level:  Lab Results  Component Value Date   [facility] <5 09/06/2015   ETH <11 03/05/2014    Physical Findings: AIMS: Facial and Oral Movements Muscles of Facial Expression: None, normal Lips and Perioral Area: None, normal Jaw: None, normal Tongue: None, normal,Extremity Movements Upper (arms, wrists, hands, fingers): None, normal Lower (legs, knees, ankles, toes): None, normal, Trunk Movements Neck, shoulders, hips: None, normal, Overall Severity Severity of abnormal movements (highest score from questions above): None, normal Incapacitation due to abnormal movements: None, normal Patient's awareness of abnormal movements (rate only patient's report): No Awareness, Dental Status Current problems with teeth and/or dentures?:  No Does patient usually wear dentures?: No  CIWA:    COWS:     Musculoskeletal: Strength & Muscle Tone: within normal limits Gait & Station: walks slowly with walker due to leg /knee pain  Patient leans: N/A  Psychiatric Specialty Exam: ROS  No chest pain, no SOB at this time .   Blood pressure 120/64, pulse 105, temperature 98.3 F (36.8 C), temperature source Oral, resp. rate 20, height 5' 5.5" (1.664 m), weight 230 lb (104.327 kg).Body mass index is 37.68 kg/(m^2).  General Appearance: Fairly Groomed  Engineer, water::  Good  Speech:  Normal Rate  Volume:  Normal  Mood:  Depressed  Affect:  constricted, but reactive, and does smile briefly at times   Thought Process:  Linear  Orientation:  Full (Time, Place, and Person)  Thought Content:  denies hallucinations, no delusions, not internally preoccupied   Suicidal Thoughts:  No at this time denies suicidal ideations and denies any self injurious ideations   Homicidal Thoughts:  No  Memory:  recent and remote grossly intact   Judgement:  Other:  improving   Insight:  improving   Psychomotor Activity:  Normal  Concentration:  Good  Recall:  Good  Fund of Knowledge:Good  Language: Good  Akathisia:  No  Handed:  Right  AIMS (if indicated):     Assets:  Desire for Improvement Resilience  ADL's:  Intact  Cognition: WNL  Sleep:  Number of Hours: 6.5  Assessment - patient remains depressed, sad, but at this time denies suicidal ideations, and contracts for safety on unit. Attributes depression in part to chronic psychosocial stressors, mainly financial and housing issues . At this time tolerating medications well .  Treatment Plan Summary: Encourage ongoing milieu and group therapy participation to work on coping skills and symptom reduction Increase Zoloft  To 100 mgrs QDAY for depression Continue Remeron 15 mgrs QHS for depression and insomnia  Continue Abilify 5 mgrs QDAY for depression, mood disorder augmentation therapy As  patient on Xarelto we have reviewed increased risk of bleeding/ GI bleed .   Neita Garnet, MD 09/09/2015, 10:59 AM

## 2015-09-09 NOTE — Plan of Care (Signed)
Problem: Diagnosis: Increased Risk For Suicide Attempt Goal: STG-Patient Will Comply With Medication Regime Outcome: Progressing Patient is compliant with her medication regimen.

## 2015-09-10 NOTE — Progress Notes (Signed)
D: Patient pleasant, slow to speech, and cooperative. She states that she had a good day and attended groups. She rated her depression 7/10 and her anxiety 7/10. She denies SI, HI, and AVH. She would like to get the most out of group therapy. She would like to learn to cope with her depression better.   A: Patient's safety maintained through 15 minute check, observation, and encouragement/support.  R: Patient compliant with treatment and receptive. Will continue to monitor.

## 2015-09-10 NOTE — Progress Notes (Signed)
Adult Psychoeducational Group Note  Date:  09/10/2015 Time:  11:14 AM  Group Topic/Focus:  Stages of Change:   The focus of this group is to explain the stages of change and help patients identify changes they want to make upon discharge.  Participation Level:  Active  Participation Quality:  Appropriate  Affect:  Appropriate  Cognitive:  Appropriate  Insight: Appropriate, Good and Improving  Engagement in Group:  Engaged and Supportive  Modes of Intervention:  Discussion  Additional Comments:  Pt participated in group discussion and wants to continue to work on her issues with isolation.  Pt states that when she is depressed she tends to go into isolation.  Pt states that she is going to come out of her room more and try to communicate with others.    Safina Huard R Maryrose Colvin 09/10/2015, 11:14 AM

## 2015-09-10 NOTE — B[facility] Group Notes (Signed)
[facility] LCSW Group Therapy 09/10/2015 1:15 PM Type of Therapy: Group Therapy Participation Level: Active  Participation Quality: Attentive, Sharing and Supportive  Affect: Depressed and Flat  Cognitive: Alert and Oriented  Insight: Developing/Improving and Engaged  Engagement in Therapy: Developing/Improving and Engaged  Modes of Intervention: Activity, Clarification, Confrontation, Discussion, Education, Exploration, Limit-setting, Orientation, Problem-solving, Rapport Building, Art therapist, Socialization and Support  Summary of Progress/Problems: Patient was attentive and engaged with speaker from [facility]. Patient was attentive to speaker while they shared their story of dealing with mental health and overcoming it. Patient expressed interest in their programs and services and received information on their agency. Patient processed ways they can relate to the speaker.   Tilden Fossa, LCSW Clinical Social Worker [facility] [phone]

## 2015-09-10 NOTE — BHH Group Notes (Signed)
[facility] Group Notes:  (Nursing/MHT/Case Management/Adjunct)  Date:  09/10/2015  Time: 0900 am  Type of Therapy:  Psychoeducational Skills  Participation Level:  Did Not Attend  Patient invited; declined to attend.  [name] White 09/10/2015, 11:07 AM

## 2015-09-10 NOTE — Progress Notes (Signed)
Patient ID: [name] White, female   DOB: [birth year], 60 y.o.   MRN: [id number] [facility] MD Progress Note  09/10/2015 3:26 PM MAKINZIE CONSIDINE  MRN:  [id number] Subjective:  Patient reports she is feeling " a little better", but still depressed, sad . Denies medication side effects. Denies suicidal ideations. Reports chronic bilateral knee pain.  Objective : I have discussed case with treatment team and have met with patient. As per staff report remains depressed, but reports some improvement . Mood depressed, but pleasant and smiling on approach. Visible in milieu, going to some groups, behavior on unit in good control . Denies medication side effects.  Patient has chronic knee pain- states " I know what I need is surgery- I need knee replacement . But I am scared , because I know it is a big surgery".  We discussed pain management options, in the past has tried Ultram, with some improvement - Acetaminophen helps " take the edge off ". Regarding NSAIDs, states she has been advised not to take them because of medical concerns ( possibly increased risk of GI bleed, or renal toxicity )     Principal Problem: MDD (major depressive disorder), recurrent episode, severe ([place]) Diagnosis:   Patient Active Problem List   Diagnosis Date Noted  . MDD (major depressive disorder), recurrent episode, severe ([place]) [F33.2] 09/07/2015  . Atrial flutter ([place]) [I48.92] 07/29/2015  . History of atrial flutter [Z86.79] 06/26/2015  . Chest pain [R07.9] 03/22/2015  . SOB (shortness of breath) [R06.02]   . MDD (major depressive disorder), recurrent severe, without psychosis ([place]) [F33.2] 03/05/2014  . Pulmonary embolism ([place]) [I26.99] 12/27/2013  . Vocal cord dysfunction [J38.3] 11/12/2013  . Mixed headache [R51] 01/06/2012  . OVERACTIVE BLADDER [N31.8] 04/18/2008  . Osteoarthritis of both knees [M17.0] 04/18/2008  . POLYARTHRITIS [M13.0] 09/14/2007  . Obesity [E66.9] 08/24/2006  . RESTLESS LEGS SYNDROME  [G25.89] 08/24/2006  . HYPERTENSION, BENIGN SYSTEMIC [I10] 08/24/2006  . RHINITIS, ALLERGIC [J30.9] 08/24/2006  . Asthma [J45.909] 08/24/2006  . GASTROESOPHAGEAL REFLUX, NO ESOPHAGITIS [K21.9] 08/24/2006   Total Time spent with patient: 25 minutes     Past Medical History:  Past Medical History  Diagnosis Date  . Asthma   . Hypertension   . GERD (gastroesophageal reflux disease)   . Anxiety   . Depression   . Family history of anesthesia complication     "daughter; causes her to pass out afterwards"  . Pulmonary embolism ([place]) 12/28/2013    "2 clots in each lung"  . Hyperthyroidism   . Migraine     "monthly" (12/28/2013)  . Osteoarthritis     "both knees; back of my neck; right pelvic bone" (12/28/2013)  . Chronic lower back pain   . Paroxysmal a-fib [facility])     Past Surgical History  Procedure Laterality Date  . Abdominal hysterectomy    . Wisdom tooth extraction    . Thyroidectomy, partial Right 2005  . Nasal sinus surgery  2007  . Appendectomy    . Excisional hemorrhoidectomy    . Dilation and curettage of uterus    . Tubal ligation    . Breast cyst excision Right   . Electrophysiologic study N/A 05/27/2015    Procedure: A-Flutter;  Surgeon: Evans Lance, MD;  Location: [facility];  Service: Cardiovascular;  Laterality: N/A;   Family History:  Family History  Problem Relation Age of Onset  . Osteoarthritis Mother   . Asthma Mother   . Heart failure Mother  Social History:  History  Alcohol Use  . Yes    Comment: "drank some in my 30's"     History  Drug Use No    Social History   Social History  . Marital Status: Single    Spouse Name: N/A  . Number of Children: N/A  . Years of Education: N/A   Social History Main Topics  . Smoking status: Former Smoker -- 0.25 packs/day for 20 years    Types: Cigarettes    Quit date: 07/17/1999  . Smokeless tobacco: Never Used  . Alcohol Use: Yes     Comment: "drank some in my 30's"  . Drug Use: No   . Sexual Activity: No   Other Topics Concern  . None   Social History Narrative   Additional Social History:   Sleep: Good  Appetite:  Good  Current Medications: Current Facility-Administered Medications  Medication Dose Route Frequency Provider Last Rate Last Dose  . acetaminophen (TYLENOL) tablet 650 mg  650 mg Oral Q6H PRN Kerrie Buffalo, NP   650 mg at 09/10/15 0805  . albuterol (PROVENTIL HFA;VENTOLIN HFA) 108 (90 Base) MCG/ACT inhaler 2 puff  2 puff Inhalation Q6H PRN Laverle Hobby, PA-C      . albuterol (PROVENTIL) (2.5 MG/3ML) 0.083% nebulizer solution 2.5 mg  2.5 mg Nebulization Q2H PRN Laverle Hobby, PA-C      . alum & mag hydroxide-simeth (MAALOX/MYLANTA) 200-200-20 MG/5ML suspension 30 mL  30 mL Oral Q4H PRN Laverle Hobby, PA-C      . ARIPiprazole (ABILIFY) tablet 5 mg  5 mg Oral QHS Laverle Hobby, PA-C   5 mg at 09/09/15 2157  . diltiazem (CARDIZEM CD) 24 hr capsule 120 mg  120 mg Oral Daily Laverle Hobby, PA-C   120 mg at 09/10/15 0804  . flecainide (TAMBOCOR) tablet 100 mg  100 mg Oral Q12H Laverle Hobby, PA-C   100 mg at 09/10/15 0804  . hydrOXYzine (ATARAX/VISTARIL) tablet 25 mg  25 mg Oral Q6H PRN Laverle Hobby, PA-C      . magnesium hydroxide (MILK OF MAGNESIA) suspension 30 mL  30 mL Oral Daily PRN Laverle Hobby, PA-C      . mirtazapine (REMERON) tablet 15 mg  15 mg Oral QHS Nicholaus Bloom, MD   15 mg at 09/09/15 2158  . mometasone-formoterol (DULERA) 200-5 MCG/ACT inhaler 2 puff  2 puff Inhalation BID Laverle Hobby, PA-C   2 puff at 09/10/15 0804  . pantoprazole (PROTONIX) EC tablet 40 mg  40 mg Oral Daily Laverle Hobby, PA-C   40 mg at 09/10/15 0804  . potassium chloride SA (K-DUR,KLOR-CON) CR tablet 40 mEq  40 mEq Oral Daily Laverle Hobby, PA-C   40 mEq at 09/10/15 0804  . rivaroxaban (XARELTO) tablet 20 mg  20 mg Oral QPC supper Laverle Hobby, PA-C   20 mg at 09/09/15 1815  . senna-docusate (Senokot-S) tablet 1 tablet  1 tablet Oral QHS  PRN Laverle Hobby, PA-C      . sertraline (ZOLOFT) tablet 100 mg  100 mg Oral Daily Nicholaus Bloom, MD   100 mg at 09/10/15 6314    Lab Results:  No results found for this or any previous visit (from the past 64 hour(s)).  Blood Alcohol level:  Lab Results  Component Value Date   [facility] <5 09/06/2015   ETH <11 03/05/2014    Physical Findings: AIMS: Facial and Oral Movements Muscles  of Facial Expression: None, normal Lips and Perioral Area: None, normal Jaw: None, normal Tongue: None, normal,Extremity Movements Upper (arms, wrists, hands, fingers): None, normal Lower (legs, knees, ankles, toes): None, normal, Trunk Movements Neck, shoulders, hips: None, normal, Overall Severity Severity of abnormal movements (highest score from questions above): None, normal Incapacitation due to abnormal movements: None, normal Patient's awareness of abnormal movements (rate only patient's report): No Awareness, Dental Status Current problems with teeth and/or dentures?: No Does patient usually wear dentures?: No  CIWA:    COWS:     Musculoskeletal: Strength & Muscle Tone: within normal limits Gait & Station: walks slowly with walker due to leg /knee pain  Patient leans: N/A  Psychiatric Specialty Exam: ROS  No chest pain, no SOB at this time . Bilateral knee pain    Blood pressure 130/73, pulse 100, temperature 98.7 F (37.1 C), temperature source Oral, resp. rate 20, height 5' 5.5" (1.664 m), weight 230 lb (104.327 kg).Body mass index is 37.68 kg/(m^2).  General Appearance: Fairly Groomed  Engineer, water::  Good  Speech:  Normal Rate  Volume:  Normal  Mood:  Depressed, but states she is feeling a little better   Affect:  Less constricted  Thought Process:  Linear  Orientation:  Full (Time, Place, and Person)  Thought Content:  denies hallucinations, no delusions, not internally preoccupied   Suicidal Thoughts:  No at this time denies suicidal ideations and denies any self injurious  ideations   Homicidal Thoughts:  No  Memory:  recent and remote grossly intact   Judgement:  Other:  improving   Insight:  improving   Psychomotor Activity:  Normal  Concentration:  Good  Recall:  Good  Fund of Knowledge:Good  Language: Good  Akathisia:  No  Handed:  Right  AIMS (if indicated):     Assets:  Desire for Improvement Resilience  ADL's:  Intact  Cognition: WNL  Sleep:  Number of Hours: 5  Assessment - remains depressed, but less constricted affect. Chronic pain is a contributor to depression, patient reports she is ambivalent about getting total knee replacements which have been recommended . She states Acetaminophen partially helpful. Concerned about  Possible NSAID side effects. We discussed options such as Cymbalta, Neurontin .  Tolerating Remeron, Zoloft, Abilify well at this time   Treatment Plan Summary: Encourage ongoing milieu and group therapy participation to work on coping skills and symptom reduction Continue Zoloft   100 mgrs QDAY for depression Continue Remeron 15 mgrs QHS for depression and insomnia  Continue Abilify 5 mgrs QDAY for depression, mood disorder augmentation therapy As patient on Xarelto we have reviewed increased risk of bleeding/ GI bleed .   Neita Garnet, MD 09/10/2015, 3:26 PM

## 2015-09-10 NOTE — Progress Notes (Signed)
D: Patient remains isolative to room.  She does get up for groups with minimal participation.  She states that she "feels a little better."  Patient rates her depression and anxiety as a 7; hopelessness as a 6.  She denies SI/HI/AVH.  She continues to complain of pain in her knees from her osteoarthritis.  She is also complaining of an upset stomach.  She has a brighter affect today and was more inactive with staff. A: Continue to monitor medication management and MD orders.  Safety checks completed every 15 minutes per protocol.  Offer support and encouragement as needed. R: Patient is receptive to staff; her behavior is appropriate.

## 2015-09-10 NOTE — B[facility] Group Notes (Signed)
Adult Psychoeducational Group Note  Date:  09/10/2015 Time:  8:54 PM  Group Topic/Focus:  Wrap-Up Group:   The focus of this group is to help patients review their daily goal of treatment and discuss progress on daily workbooks.  Participation Level:  Did Not Attend  Participation Quality:  None  Affect:  None  Cognitive:  None  Insight: None  Engagement in Group:  None  Modes of Intervention:  Discussion  Additional Comments:  Pt did not attend group.  Victorino Sparrow A 09/10/2015, 8:54 PM

## 2015-09-10 NOTE — Progress Notes (Signed)
Nutrition Education Note  Pt attended group focusing on general, healthful nutrition education.  RD emphasized the importance of eating regular meals and snacks throughout the day. Consuming sugar-free beverages and incorporating fruits and vegetables into diet when possible. Provided examples of healthy snacks. Patient encouraged to leave group with a goal to improve nutrition/healthy eating.   Diet Order: Diet regular Room service appropriate?: Yes; Fluid consistency:: Thin Pt is also offered choice of unit snacks mid-morning and mid-afternoon.  Pt is eating as desired.   If additional nutrition issues arise, please consult RD.     Loghan Subia, RD, LDN Inpatient Clinical Dietitian Pager # [phone] After hours/weekend pager # [phone]     

## 2015-09-11 MED ORDER — DICLOFENAC SODIUM 1 % TD GEL
2.0000 g | Freq: Four times a day (QID) | TRANSDERMAL | Status: DC
  Administered 2015-09-11 – 2015-09-14 (×4): 2 g via TOPICAL
  Filled 2015-09-11 (×2): qty 100

## 2015-09-11 MED ORDER — TRAMADOL HCL 50 MG PO TABS: 50.0000 mg | ORAL_TABLET | Freq: Two times a day (BID) | ORAL | Status: DC | PRN

## 2015-09-11 NOTE — Progress Notes (Signed)
Patient ID: [name] White, female   DOB: 1956/05/29, 60 y.o.   MRN: [id number] [facility] MD Progress Note  09/11/2015 4:18 PM [name]  MRN:  [id number] Subjective:  Remains depressed, anxious, but does state she is feeling a little better . She worries today as she has not been able to reach her roommate on the phone. She describes ongoing bilateral knee pain, usually mild to moderate, but persistent and distressing .Marland Kitchen  Objective : I have discussed case with treatment team and have met with patient. Visible on unit, going to groups, pleasant and cooperative on approach. Although still depressed, presents with some improvement compared to admission, and affect less constricted . Denies medication side effects. Visible in milieu, going to some groups, behavior on unit in good control . Denies medication side effects.  I have discussed best analgesic options with hospitalist and with pharmacist, as well as with patient. Patient agrees to avoid opiates if possible . As per hospitalist , although systemic NSAIDs might cause drug drug interactions and side effects, topical Voltaren gel should be safe. Patient states she has used this before with good result and no side effects.     Principal Problem: MDD (major depressive disorder), recurrent episode, severe ([place]) Diagnosis:   Patient Active Problem List   Diagnosis Date Noted  . MDD (major depressive disorder), recurrent episode, severe ([place]) [F33.2] 09/07/2015  . Atrial flutter ([place]) [I48.92] 07/29/2015  . History of atrial flutter [Z86.79] 06/26/2015  . Chest pain [R07.9] 03/22/2015  . SOB (shortness of breath) [R06.02]   . MDD (major depressive disorder), recurrent severe, without psychosis ([place]) [F33.2] 03/05/2014  . Pulmonary embolism ([place]) [I26.99] 12/27/2013  . Vocal cord dysfunction [J38.3] 11/12/2013  . Mixed headache [R51] 01/06/2012  . OVERACTIVE BLADDER [N31.8] 04/18/2008  . Osteoarthritis of both knees [M17.0] 04/18/2008   . POLYARTHRITIS [M13.0] 09/14/2007  . Obesity [E66.9] 08/24/2006  . RESTLESS LEGS SYNDROME [G25.89] 08/24/2006  . HYPERTENSION, BENIGN SYSTEMIC [I10] 08/24/2006  . RHINITIS, ALLERGIC [J30.9] 08/24/2006  . Asthma [J45.909] 08/24/2006  . GASTROESOPHAGEAL REFLUX, NO ESOPHAGITIS [K21.9] 08/24/2006   Total Time spent with patient: 25 minutes     Past Medical History:  Past Medical History  Diagnosis Date  . Asthma   . Hypertension   . GERD (gastroesophageal reflux disease)   . Anxiety   . Depression   . Family history of anesthesia complication     "daughter; causes her to pass out afterwards"  . Pulmonary embolism ([place]) 12/28/2013    "2 clots in each lung"  . Hyperthyroidism   . Migraine     "monthly" (12/28/2013)  . Osteoarthritis     "both knees; back of my neck; right pelvic bone" (12/28/2013)  . Chronic lower back pain   . Paroxysmal a-fib [facility])     Past Surgical History  Procedure Laterality Date  . Abdominal hysterectomy    . Wisdom tooth extraction    . Thyroidectomy, partial Right 2005  . Nasal sinus surgery  2007  . Appendectomy    . Excisional hemorrhoidectomy    . Dilation and curettage of uterus    . Tubal ligation    . Breast cyst excision Right   . Electrophysiologic study N/A 05/27/2015    Procedure: A-Flutter;  Surgeon: Evans Lance, MD;  Location: [place] [facility];  Service: Cardiovascular;  Laterality: N/A;   Family History:  Family History  Problem Relation Age of Onset  . Osteoarthritis Mother   . Asthma Mother   .  Heart failure Mother     Social History:  History  Alcohol Use  . Yes    Comment: "drank some in my 30's"     History  Drug Use No    Social History   Social History  . Marital Status: Single    Spouse Name: N/A  . Number of Children: N/A  . Years of Education: N/A   Social History Main Topics  . Smoking status: Former Smoker -- 0.25 packs/day for 20 years    Types: Cigarettes    Quit date: 07/17/1999  . Smokeless  tobacco: Never Used  . Alcohol Use: Yes     Comment: "drank some in my 30's"  . Drug Use: No  . Sexual Activity: No   Other Topics Concern  . None   Social History Narrative   Additional Social History:   Sleep: Good  Appetite:  Good  Current Medications: Current Facility-Administered Medications  Medication Dose Route Frequency Provider Last Rate Last Dose  . acetaminophen (TYLENOL) tablet 650 mg  650 mg Oral Q6H PRN Kerrie Buffalo, NP   650 mg at 09/10/15 0805  . albuterol (PROVENTIL HFA;VENTOLIN HFA) 108 (90 Base) MCG/ACT inhaler 2 puff  2 puff Inhalation Q6H PRN Laverle Hobby, PA-C      . albuterol (PROVENTIL) (2.5 MG/3ML) 0.083% nebulizer solution 2.5 mg  2.5 mg Nebulization Q2H PRN Laverle Hobby, PA-C      . alum & mag hydroxide-simeth (MAALOX/MYLANTA) 200-200-20 MG/5ML suspension 30 mL  30 mL Oral Q4H PRN Laverle Hobby, PA-C      . ARIPiprazole (ABILIFY) tablet 5 mg  5 mg Oral QHS Laverle Hobby, PA-C   5 mg at 09/10/15 2125  . diclofenac sodium (VOLTAREN) 1 % transdermal gel 2 g  2 g Topical QID Jenne Campus, MD      . diltiazem (CARDIZEM CD) 24 hr capsule 120 mg  120 mg Oral Daily Laverle Hobby, PA-C   120 mg at 09/11/15 0758  . flecainide (TAMBOCOR) tablet 100 mg  100 mg Oral Q12H Laverle Hobby, PA-C   100 mg at 09/11/15 0759  . hydrOXYzine (ATARAX/VISTARIL) tablet 25 mg  25 mg Oral Q6H PRN Laverle Hobby, PA-C   25 mg at 09/10/15 2125  . magnesium hydroxide (MILK OF MAGNESIA) suspension 30 mL  30 mL Oral Daily PRN Laverle Hobby, PA-C      . mirtazapine (REMERON) tablet 15 mg  15 mg Oral QHS Nicholaus Bloom, MD   15 mg at 09/10/15 2125  . mometasone-formoterol (DULERA) 200-5 MCG/ACT inhaler 2 puff  2 puff Inhalation BID Laverle Hobby, PA-C   2 puff at 09/11/15 0757  . pantoprazole (PROTONIX) EC tablet 40 mg  40 mg Oral Daily Laverle Hobby, PA-C   40 mg at 09/11/15 0758  . potassium chloride SA (K-DUR,KLOR-CON) CR tablet 40 mEq  40 mEq Oral Daily Laverle Hobby, PA-C   40 mEq at 09/11/15 0757  . rivaroxaban (XARELTO) tablet 20 mg  20 mg Oral QPC supper Laverle Hobby, PA-C   20 mg at 09/10/15 2125  . senna-docusate (Senokot-S) tablet 1 tablet  1 tablet Oral QHS PRN Laverle Hobby, PA-C      . sertraline (ZOLOFT) tablet 100 mg  100 mg Oral Daily Nicholaus Bloom, MD   100 mg at 09/11/15 1093    Lab Results:  No results found for this or any previous visit (from the past  48 hour(s)).  Blood Alcohol level:  Lab Results  Component Value Date   [facility] <5 09/06/2015   ETH <11 03/05/2014    Physical Findings: AIMS: Facial and Oral Movements Muscles of Facial Expression: None, normal Lips and Perioral Area: None, normal Jaw: None, normal Tongue: None, normal,Extremity Movements Upper (arms, wrists, hands, fingers): None, normal Lower (legs, knees, ankles, toes): None, normal, Trunk Movements Neck, shoulders, hips: None, normal, Overall Severity Severity of abnormal movements (highest score from questions above): None, normal Incapacitation due to abnormal movements: None, normal Patient's awareness of abnormal movements (rate only patient's report): No Awareness, Dental Status Current problems with teeth and/or dentures?: No Does patient usually wear dentures?: No  CIWA:    COWS:     Musculoskeletal: Strength & Muscle Tone: within normal limits Gait & Station: walks slowly with walker due to leg /knee pain  Patient leans: N/A  Psychiatric Specialty Exam: ROS  No chest pain, no SOB at this time . Bilateral knee pain    Blood pressure 148/73, pulse 114, temperature 98 F (36.7 C), temperature source Oral, resp. rate 16, height 5' 5.5" (1.664 m), weight 230 lb (104.327 kg).Body mass index is 37.68 kg/(m^2).  General Appearance: improved grooming   Eye Contact::  Good  Speech:  Normal Rate  Volume:  Normal  Mood:  Depressed, but better than on admission   Affect:  Less constricted, smiles at times appropriately   Thought Process:   Linear  Orientation:  Full (Time, Place, and Person)  Thought Content:  denies hallucinations, no delusions, not internally preoccupied   Suicidal Thoughts:  No at this time denies suicidal ideations and denies any self injurious ideations   Homicidal Thoughts:  No  Memory:  recent and remote grossly intact   Judgement:  Other:  improving   Insight:  improving   Psychomotor Activity:  Normal  Concentration:  Good  Recall:  Good  Fund of Knowledge:Good  Language: Good  Akathisia:  No  Handed:  Right  AIMS (if indicated):     Assets:  Desire for Improvement Resilience  ADL's:  Intact  Cognition: WNL  Sleep:  Number of Hours: 6  Assessment - patient remains depressed, and today expressing some anxiety that her roommate /friend is not answering her phone . Denies SI. States she still feels far from her normal mood /normal self, but acknowledges some improvement . Denies medication side effects. We have discussed alternatives for bilateral knee pain and as recommended, would consider Voltaren Gel topically to knees to address .  Treatment Plan Summary: Encourage ongoing milieu and group therapy participation to work on coping skills and symptom reduction Continue Zoloft   100 mgrs QDAY for depression Continue Remeron 15 mgrs QHS for depression and insomnia  Continue Abilify 5 mgrs QDAY for depression, mood disorder augmentation therapy Start Voltaren Gel for knee pain .  As patient on Xarelto we have reviewed increased risk of bleeding/ GI bleed .   Neita Garnet, MD 09/11/2015, 4:18 PM

## 2015-09-11 NOTE — BHH Group Notes (Signed)
Adult Psychoeducational Group Note  Date:  09/11/2015 Time:  9:14 PM  Group Topic/Focus:  Wrap-Up Group:   The focus of this group is to help patients review their daily goal of treatment and discuss progress on daily workbooks.  Participation Level:  Active  Participation Quality:  Appropriate  Affect:  Appropriate  Cognitive:  Appropriate  Insight: Appropriate  Engagement in Group:  Engaged  Modes of Intervention:  Discussion  Additional Comments:  Pt stated her day was much better today because she "came out of her room, mingled with people and tried to make others feel good".  Pt is discharging on Monday.  Victorino Sparrow A 09/11/2015, 9:14 PM

## 2015-09-11 NOTE — BHH Group Notes (Signed)
[place] LCSW Group Therapy 09/11/2015 1:15 PM Type of Therapy: Group Therapy Participation Level: Active  Participation Quality: Attentive, Sharing and Supportive  Affect: Depressed and Flat  Cognitive: Alert and Oriented  Insight: Developing/Improving and Engaged  Engagement in Therapy: Developing/Improving and Engaged  Modes of Intervention: Clarification, Confrontation, Discussion, Education, Exploration, Limit-setting, Orientation, Problem-solving, Rapport Building, Art therapist, Socialization and Support  Summary of Progress/Problems: The topic for today was feelings about relapse. Pt discussed what relapse prevention is to them and identified triggers that they are on the path to relapse. Pt processed their feeling towards relapse and was able to relate to peers. Pt discussed coping skills that can be used for relapse prevention. Patient identified her relapse behavior at isolation. She participated minimally in discussion but did engage in mindfulness practices.   Tilden Fossa, MSW, [place] Clinical Social Worker [facility] [phone]

## 2015-09-11 NOTE — Tx Team (Signed)
Interdisciplinary Treatment Plan Update (Adult) Date: 09/11/2015    Time Reviewed: 9:30 AM  Progress in Treatment: Attending groups: Yes Participating in groups: Minimally Taking medication as prescribed: Yes Tolerating medication: Yes Family/Significant other contact made: No, CSW assessing for appropriate contacts Patient understands diagnosis: Yes Discussing patient identified problems/goals with staff: Yes Medical problems stabilized or resolved: Yes Denies suicidal/homicidal ideation: Yes Issues/concerns per patient self-inventory: Yes Other:  New problem(s) identified: N/A  Discharge Plan or Barriers: Patient will return to follow up with outpatient services at [facility] and TCT services.   Reason for Continuation of Hospitalization:  Depression Anxiety Medication Stabilization   Comments: N/A  Estimated length of stay: 2-3 days    Patient is a 60 year old female with a diagnosis of Major Depressive Disorder. Pt presented to the hospital with increased depression and suicidal ideations. Pt reports primary trigger(s) for admission were financial, housing, and social stressors. Patient will benefit from crisis stabilization, medication evaluation, group therapy and psycho education in addition to case management for discharge planning. At discharge, it is recommended that Pt remain compliant with established discharge plan and continued treatment.   Review of initial/current patient goals per problem list:  1. Goal(s): Patient will participate in aftercare plan   Met: Yes   Target date: 3-5 days post admission date   As evidenced by: Patient will participate within aftercare plan AEB aftercare provider and housing plan at discharge being identified.   3/14: Goal not met: CSW assessing for appropriate referrals for pt and will have follow up secured prior to d/c.  3/17: Goal met. Patient will return home to follow up with outpatient services at [facility] and TCT  services.    2. Goal (s): Patient will exhibit decreased depressive symptoms and suicidal ideations.   Met: Goal progressing.   Target date: 3-5 days post admission date   As evidenced by: Patient will utilize self rating of depression at 3 or below and demonstrate decreased signs of depression or be deemed stable for discharge by MD.  3/14: Goal not met: Pt presents with flat affect and depressed mood.  Pt admitted with depression rating of 10.  Pt to show decreased sign of depression and a rating of 3 or less before d/c.    3/17: Goal progressing. Patient continues to isolate from milieu and presents with flat and depressed affect.     3. Goal(s): Patient will demonstrate decreased signs and symptoms of anxiety.   Met: Goal progressing   Target date: 3-5 days post admission date   As evidenced by: Patient will utilize self rating of anxiety at 3 or below and demonstrated decreased signs of anxiety, or be deemed stable for discharge by MD  3/14: Goal not met: Pt presents with anxious mood and affect.  Pt admitted with anxiety rating of 10.  Pt to show decreased sign of anxiety and a rating of 3 or less before d/c.  3/17: Goal progressing.    Attendees: Patient:    Family:    Physician: Dr. Parke Poisson; Dr. Sabra Heck 09/11/2015 9:30 AM  Nursing: Grayland Ormond, Marcella Dubs, Sharee Pimple, RN 09/11/2015 9:30 AM  Clinical Social Worker: Tilden Fossa, LCSW 09/11/2015 9:30 AM  Other: Peri Maris, LCSWA; [place], LCSW  09/11/2015 9:30 AM  Other: Norberto Sorenson, P4CC 09/11/2015 9:30 AM  Other: Lars Pinks, Case Manager 09/11/2015 9:30 AM  Other: Agustina Caroli, May Augustin, NP 09/11/2015 9:30 AM  Other:  Scribe for Treatment Team:   , LCSW [phone]     

## 2015-09-11 NOTE — BHH Group Notes (Signed)
[facility] LCSW Aftercare Discharge Planning Group Note  09/11/2015  8:45 AM  Participation Quality: Did Not Attend. Patient invited to participate but declined.  Tilden Fossa, MSW, [place] Clinical Social Worker [facility] [phone]

## 2015-09-11 NOTE — Progress Notes (Signed)
D:Patient in the dayroom playing card with peers.  Patient appears depressed and rates her depression 6/10.  Patient rates anxiety 6/10.  Patient states today is the best day she has had here so far.  Patient states she has really enjoyed interacting with peers.  Patient states her goal for today was to engage and interact with peers.  Patient states she met her goal.  Patient denies SI/HI and denies AVH. A: Staff to monitor Q 15 mins for safety.  Encouragement and support offered.  Scheduled medications administered per orders.  Vistaril administered prn for anxiety. R: Patient remains safe on the unit.  Patient attended group tonight.  Patient visible on the unit and interacting with peers.  Patient taking administered medications.

## 2015-09-11 NOTE — Plan of Care (Signed)
Problem: Alteration in mood Goal: LTG-Patient reports reduction in suicidal thoughts (Patient reports reduction in suicidal thoughts and is able to verbalize a safety plan for whenever patient is feeling suicidal)  Outcome: Progressing Denies SI at this time     

## 2015-09-11 NOTE — Progress Notes (Signed)
D: Pt denies SI/HI/AVh. Pt is pleasant and cooperative. Pt increased depression/ anxiety  A: Pt was offered support and encouragement. Pt was given scheduled medications. Pt was encourage to attend groups. Q 15 minute checks were done for safety.  R:Pt attends groups and interacts well with peers and staff. Pt is taking medication. Pt has no complaints.Pt receptive to treatment and safety maintained on unit.

## 2015-09-11 NOTE — Progress Notes (Signed)
Patient ID: [name] White, female   DOB: December 09, 1955, 60 y.o.   MRN: [id number] Pt currently presents with a flat affect and cooperative behavior. Per self inventory, pt rates depression at a 6, hopelessness 6 and anxiety 7. Pt's daily goal is "interaction with others" and they intend to do so by "play games." Pt reports fair sleep, a good appetite, low energy and poor concentration. Pt main complaint is a sore throat and nasal congestion/drainage. Pt seen in dayroom, interacting positively with peers.   Pt provided with medications per providers orders. Pt's labs and vitals were monitored throughout the day. Pt supported emotionally and encouraged to express concerns and questions. Pt educated on medications. Md notified of patients physical complaints today.   Pt's safety ensured with 15 minute and environmental checks. Pt currently denies SI/HI and A/V hallucinations. Pt verbally agrees to seek staff if SI/HI or A/VH occurs and to consult with staff before acting on these thoughts. Will continue POC.

## 2015-09-12 MED ORDER — AMOXICILLIN 500 MG PO CAPS
500.0000 mg | ORAL_CAPSULE | Freq: Two times a day (BID) | ORAL | Status: DC
  Administered 2015-09-12 – 2015-09-16 (×9): 500 mg via ORAL
  Filled 2015-09-12 (×15): qty 1

## 2015-09-12 MED ORDER — GUAIFENESIN ER 600 MG PO TB12
600.0000 mg | ORAL_TABLET | Freq: Two times a day (BID) | ORAL | Status: DC
  Administered 2015-09-12 – 2015-09-16 (×9): 600 mg via ORAL
  Filled 2015-09-12 (×15): qty 1

## 2015-09-12 NOTE — Progress Notes (Signed)
[facility] / [facility] MD Progress Note  09/12/2015 10:18 AM [name] White  MRN:  [id number] Subjective:  Remains depressed, anxious, but does state she is feeling a little better.  She describes ongoing bilateral knee pain, usually mild to moderate, but persistent and distressing.  She is coughing and feels congested. Objective: I have discussed case with treatment team and have met with patient. Patient was in bed.  She had been dealing with a cough that had worsened since she came in to [facility]. Visible on unit, going to groups, pleasant and cooperative on approach. Although still depressed, presents with some improvement compared to admission, and affect less constricted . Denies medication side effects. Visible in milieu, going to some groups, behavior on unit in good control . Denies medication side effects.  I have discussed best analgesic options with hospitalist and with pharmacist, as well as with patient. Patient agrees to avoid opiates if possible . As per hospitalist , although systemic NSAIDs might cause drug drug interactions and side effects, topical Voltaren gel should be safe. Patient states she has used this before with good result and no side effects.     Principal Problem: MDD (major depressive disorder), recurrent episode, severe ([place]) Diagnosis:   Patient Active Problem List   Diagnosis Date Noted  . MDD (major depressive disorder), recurrent episode, severe ([place]) [F33.2] 09/07/2015  . Atrial flutter ([place]) [I48.92] 07/29/2015  . History of atrial flutter [Z86.79] 06/26/2015  . Chest pain [R07.9] 03/22/2015  . SOB (shortness of breath) [R06.02]   . MDD (major depressive disorder), recurrent severe, without psychosis ([place]) [F33.2] 03/05/2014  . Pulmonary embolism ([name]) [I26.99] 12/27/2013  . Vocal cord dysfunction [J38.3] 11/12/2013  . Mixed headache [R51] 01/06/2012  . OVERACTIVE BLADDER [N31.8] 04/18/2008  . Osteoarthritis of both knees [M17.0] 04/18/2008  . POLYARTHRITIS [M13.0]  09/14/2007  . Obesity [E66.9] 08/24/2006  . RESTLESS LEGS SYNDROME [G25.89] 08/24/2006  . HYPERTENSION, BENIGN SYSTEMIC [I10] 08/24/2006  . RHINITIS, ALLERGIC [J30.9] 08/24/2006  . Asthma [J45.909] 08/24/2006  . GASTROESOPHAGEAL REFLUX, NO ESOPHAGITIS [K21.9] 08/24/2006   Total Time spent with patient: 25 minutes     Past Medical History:  Past Medical History  Diagnosis Date  . Asthma   . Hypertension   . GERD (gastroesophageal reflux disease)   . Anxiety   . Depression   . Family history of anesthesia complication     "daughter; causes her to pass out afterwards"  . Pulmonary embolism ([place]) 12/28/2013    "2 clots in each lung"  . Hyperthyroidism   . Migraine     "monthly" (12/28/2013)  . Osteoarthritis     "both knees; back of my neck; right pelvic bone" (12/28/2013)  . Chronic lower back pain   . Paroxysmal a-fib [facility])     Past Surgical History  Procedure Laterality Date  . Abdominal hysterectomy    . Wisdom tooth extraction    . Thyroidectomy, partial Right 2005  . Nasal sinus surgery  2007  . Appendectomy    . Excisional hemorrhoidectomy    . Dilation and curettage of uterus    . Tubal ligation    . Breast cyst excision Right   . Electrophysiologic study N/A 05/27/2015    Procedure: A-Flutter;  Surgeon: Evans Lance, MD;  Location: [facility];  Service: Cardiovascular;  Laterality: N/A;   Family History:  Family History  Problem Relation Age of Onset  . Osteoarthritis Mother   . Asthma Mother   . Heart failure Mother  Social History:  History  Alcohol Use  . Yes    Comment: "drank some in my 30's"     History  Drug Use No    Social History   Social History  . Marital Status: Single    Spouse Name: N/A  . Number of Children: N/A  . Years of Education: N/A   Social History Main Topics  . Smoking status: Former Smoker -- 0.25 packs/day for 20 years    Types: Cigarettes    Quit date: 07/17/1999  . Smokeless tobacco: Never Used  .  Alcohol Use: Yes     Comment: "drank some in my 30's"  . Drug Use: No  . Sexual Activity: No   Other Topics Concern  . None   Social History Narrative   Additional Social History:   Sleep: Good  Appetite:  Good  Current Medications: Current Facility-Administered Medications  Medication Dose Route Frequency Provider Last Rate Last Dose  . acetaminophen (TYLENOL) tablet 650 mg  650 mg Oral Q6H PRN Kerrie Buffalo, NP   650 mg at 09/10/15 0805  . albuterol (PROVENTIL HFA;VENTOLIN HFA) 108 (90 Base) MCG/ACT inhaler 2 puff  2 puff Inhalation Q6H PRN Laverle Hobby, PA-C      . albuterol (PROVENTIL) (2.5 MG/3ML) 0.083% nebulizer solution 2.5 mg  2.5 mg Nebulization Q2H PRN Laverle Hobby, PA-C      . alum & mag hydroxide-simeth (MAALOX/MYLANTA) 200-200-20 MG/5ML suspension 30 mL  30 mL Oral Q4H PRN Laverle Hobby, PA-C      . amoxicillin (AMOXIL) capsule 500 mg  500 mg Oral Q12H Kerrie Buffalo, NP      . ARIPiprazole (ABILIFY) tablet 5 mg  5 mg Oral QHS Laverle Hobby, PA-C   5 mg at 09/11/15 2240  . diclofenac sodium (VOLTAREN) 1 % transdermal gel 2 g  2 g Topical QID Jenne Campus, MD   Stopped at 09/12/15 [phone]  . diltiazem (CARDIZEM CD) 24 hr capsule 120 mg  120 mg Oral Daily Laverle Hobby, PA-C   120 mg at 09/12/15 2330  . flecainide (TAMBOCOR) tablet 100 mg  100 mg Oral Q12H Laverle Hobby, PA-C   100 mg at 09/12/15 0762  . guaiFENesin (MUCINEX) 12 hr tablet 600 mg  600 mg Oral BID Kerrie Buffalo, NP      . hydrOXYzine (ATARAX/VISTARIL) tablet 25 mg  25 mg Oral Q6H PRN Laverle Hobby, PA-C   25 mg at 09/11/15 2115  . magnesium hydroxide (MILK OF MAGNESIA) suspension 30 mL  30 mL Oral Daily PRN Laverle Hobby, PA-C      . mirtazapine (REMERON) tablet 15 mg  15 mg Oral QHS Nicholaus Bloom, MD   15 mg at 09/11/15 2240  . mometasone-formoterol (DULERA) 200-5 MCG/ACT inhaler 2 puff  2 puff Inhalation BID Laverle Hobby, PA-C   2 puff at 09/12/15 2633  . pantoprazole (PROTONIX) EC  tablet 40 mg  40 mg Oral Daily Laverle Hobby, PA-C   40 mg at 09/12/15 3545  . potassium chloride SA (K-DUR,KLOR-CON) CR tablet 40 mEq  40 mEq Oral Daily Laverle Hobby, PA-C   40 mEq at 09/12/15 6256  . rivaroxaban (XARELTO) tablet 20 mg  20 mg Oral QPC supper Laverle Hobby, PA-C   20 mg at 09/11/15 1816  . senna-docusate (Senokot-S) tablet 1 tablet  1 tablet Oral QHS PRN Laverle Hobby, PA-C      . sertraline (ZOLOFT) tablet 100  mg  100 mg Oral Daily Nicholaus Bloom, MD   100 mg at 09/12/15 5726    Lab Results:  No results found for this or any previous visit (from the past 48 hour(s)).  Blood Alcohol level:  Lab Results  Component Value Date   [facility] <5 09/06/2015   ETH <11 03/05/2014    Physical Findings: AIMS: Facial and Oral Movements Muscles of Facial Expression: None, normal Lips and Perioral Area: None, normal Jaw: None, normal Tongue: None, normal,Extremity Movements Upper (arms, wrists, hands, fingers): None, normal Lower (legs, knees, ankles, toes): None, normal, Trunk Movements Neck, shoulders, hips: None, normal, Overall Severity Severity of abnormal movements (highest score from questions above): None, normal Incapacitation due to abnormal movements: None, normal Patient's awareness of abnormal movements (rate only patient's report): No Awareness, Dental Status Current problems with teeth and/or dentures?: No Does patient usually wear dentures?: No  CIWA:    COWS:     Musculoskeletal: Strength & Muscle Tone: within normal limits Gait & Station: walks slowly with walker due to leg /knee pain  Patient leans: N/A  Psychiatric Specialty Exam: ROS  No chest pain, no SOB at this time . Bilateral knee pain    Blood pressure 136/87, pulse 108, temperature 98 F (36.7 C), temperature source Oral, resp. rate 16, height 5' 5.5" (1.664 m), weight 104.327 kg (230 lb).Body mass index is 37.68 kg/(m^2).  General Appearance: improved grooming   Eye Contact::  Good   Speech:  Normal Rate  Volume:  Normal  Mood:  Depressed, but better than on admission   Affect:  Less constricted, smiles at times appropriately   Thought Process:  Linear  Orientation:  Full (Time, Place, and Person)  Thought Content:  denies hallucinations, no delusions, not internally preoccupied   Suicidal Thoughts:  No at this time denies suicidal ideations and denies any self injurious ideations   Homicidal Thoughts:  No  Memory:  recent and remote grossly intact   Judgement:  Other:  improving   Insight:  improving   Psychomotor Activity:  Normal  Concentration:  Good  Recall:  Good  Fund of Knowledge:Good  Language: Good  Akathisia:  No  Handed:  Right  AIMS (if indicated):     Assets:  Desire for Improvement Resilience  ADL's:  Intact  Cognition: WNL  Sleep:  Number of Hours: 6.25  Assessment - patient remains depressed, and today expressing some anxiety that her roommate /friend is not answering her phone . Denies SI. States she still feels far from her normal mood /normal self, but acknowledges some improvement . Denies medication side effects. We have discussed alternatives for bilateral knee pain and as recommended, would consider Voltaren Gel topically to knees to address .   Treatment Plan Summary: Encourage ongoing milieu and group therapy participation to work on coping skills and symptom reduction Continue Zoloft   100 mgrs QDAY for depression Continue Remeron 15 mgrs QHS for depression and insomnia  Continue Abilify 5 mgrs QDAY for depression, mood disorder augmentation therapy Start Voltaren Gel for knee pain. Start Mucinex 600 mg BID cough Start Amoxicillin 500 mg BID x 7 days productive cough and general malaise As patient on Xarelto we have reviewed increased risk of bleeding/ GI bleed .   Janett Labella, NP [facility] 09/12/2015, 10:18 AM I agree with assessment and plan Geralyn Flash A. Sabra Heck, M.D.

## 2015-09-12 NOTE — Progress Notes (Signed)
[name] White states she is not feeling too well and her sinuses are bothering her. She has been in the bed most of the evening and only came out of her room for her medications. Her mood is somber. Her affect is flat. Denies SI/HI/AVH at this time. Medications administered as prescribed. Encouragement and support given. Continue to monitor for medication effectiveness and Q 15 minute checks for patient safety.

## 2015-09-12 NOTE — B[facility] Group Notes (Signed)
[place] LCSW Group Therapy  09/12/2015 /10 AM  Type of Therapy:  Group Therapy  Participation Level:  Did Not Attend although encouraged to do so.    Summary of Progress/Problems: The main focus of today's process group was for the patient to identify ways in which they have in the past sabotaged their own recovery. Motivational Interviewing was utilized to ask the group members what they get out of their self sabotaging behavior(s), and what reasons they may have for wanting to change. The Stages of Change were explained using a handout, and patients identified where they currently are with regard to stages of change.    Sheilah Pigeon, LCSW

## 2015-09-12 NOTE — Progress Notes (Signed)
Patient ID: [name] White, female   DOB: 1956-05-28, 60 y.o.   MRN: [id number]  Adult Psychoeducational Group Note  Date:  09/12/2015 Time: 1:15pm   Group Topic/Focus:  Making Healthy Choices:   The focus of this group is to help patients identify negative/unhealthy choices they were using prior to admission and identify positive/healthier coping strategies to replace them upon discharge.  Participation Level:  Did Not Attend  Participation Quality: n/a  Affect: n/a  Cognitive:  n/a  Insight: n/a  Engagement in Group:n/a  Modes of Intervention:  Discussion, Education and Support  Additional Comments:  Pt chose not to attend group, pt in bed asleep.   Elenore Rota 09/12/2015, 2:48 PM

## 2015-09-12 NOTE — Progress Notes (Signed)
Patient ID: THALYA WOZNY, female   DOB: 10/27/1955, 60 y.o.   MRN: [id number]   Pt currently presents with a flat affect and depressed behavior. Per self inventory, pt rates depression at a 6, hopelessness 5 and anxiety 5. Pt's daily goal is to "to be more interactive" and they intend to do so by "socialize with others." Pt reports fair sleep, a good appetite, low energy and poor concentration. Pt reports nasal congestion.   Pt provided with medications per providers orders. Pt's labs and vitals were monitored throughout the day. Pt supported emotionally and encouraged to express concerns and questions. Pt educated on medications and benefits of fluid intake. Medications ordered for nasal congestion, see MAR.   Pt's safety ensured with 15 minute and environmental checks. Pt currently denies SI/HI and A/V hallucinations. Pt verbally agrees to seek staff if SI/HI or A/VH occurs and to consult with staff before acting on these thoughts. Will continue POC.

## 2015-09-13 DIAGNOSIS — F332 Major depressive disorder, recurrent severe without psychotic features: Secondary | ICD-10-CM | POA: Insufficient documentation

## 2015-09-13 NOTE — Progress Notes (Signed)
Patient ID: [name] White, female   DOB: 04-19-1956, 60 y.o.   MRN: [id number]  DAR: Pt. Denies SI/HI and A/V Hallucinations. She reports sleep was good, appetite is good, energy level is normal, and concentration is poor. She rates depression and hopelessness 5/10, and anxiety 6/10. Patient does report pain in her knees when walking but refuses intervention at this time. She reports her congestion is better than yesterday however is still not feeling completely well physically. She is not seen in the milieu often but she did go to lunch today. Support and encouragement provided to the patient. Scheduled medications administered to patient per physician's orders. Patient is minimal but cooperative. She reports her goal is to socialize with others and be more interactive. Q15 minute checks are maintained for safety.

## 2015-09-13 NOTE — Plan of Care (Signed)
Problem: Ineffective individual coping Goal: STG: Patient will remain free from self harm Outcome: Progressing [name] White remains free from self harm and denies SI

## 2015-09-13 NOTE — BHH Group Notes (Signed)
[facility] Group Notes:  (Nursing/MHT/Case Management/Adjunct)  Date:  09/13/2015  Time:  11:37 AM  Type of Therapy:  Psychoeducational Skills  Participation Level:  Did Not Attend  Participation Quality:  N/A  Affect:  N/A  Cognitive:  N/A  Insight:  None  Engagement in Group:  None  Modes of Intervention:  Activity, Discussion, Education, Role-play and Support  Summary of Progress/Problems: Patient was invited to group but did not attend.   [name] E 09/13/2015, 11:37 AM

## 2015-09-13 NOTE — BHH Group Notes (Signed)
[facility] LCSW Group Therapy  09/13/2015  10:15 AM  Type of Therapy:  Group Therapy  Participation Level:  Did Not attend although encouraged to do so  Summary of Progress/Problems: Patients processed thoughts and feelings about up coming discharge. We then discussed what is a supportive framework? What does it look like feel like and how do I discern it from and unhealthy non-supportive network?  We then looked at visuals representing relationships as patients identified difficulties with boundaries, expectations, and poor choices within relationships    Sheilah Pigeon, LCSW

## 2015-09-13 NOTE — Progress Notes (Signed)
[facility] MD Progress Note  09/13/2015 2:53 PM [name] White  MRN:  [id number] Subjective:  Remains depressed, anxious, but does state she is feeling a little better.  She notes improvement in her cough. Objective: I have discussed case with treatment team and have met with patient. Patient was in bed but states that her cough had improved and felt that she was not as congested as yesterday.  Visible on unit, going to groups, pleasant and cooperative on approach. Although still depressed, presents with some improvement compared to admission, and affect less constricted . Denies medication side effects. Visible in milieu, going to some groups, behavior on unit in good control . Denies medication side effects.  I have discussed best analgesic options with hospitalist and with pharmacist, as well as with patient. Patient agrees to avoid opiates if possible . As per hospitalist , although systemic NSAIDs might cause drug drug interactions and side effects, topical Voltaren gel should be safe. Patient states she has used this before with good result and no side effects.   Principal Problem: MDD (major depressive disorder), recurrent episode, severe ([place]) Diagnosis:   Patient Active Problem List   Diagnosis Date Noted  . MDD (major depressive disorder), recurrent episode, severe ([place]) [F33.2] 09/07/2015  . Atrial flutter ([place]) [I48.92] 07/29/2015  . History of atrial flutter [Z86.79] 06/26/2015  . Chest pain [R07.9] 03/22/2015  . SOB (shortness of breath) [R06.02]   . MDD (major depressive disorder), recurrent severe, without psychosis ([place]) [F33.2] 03/05/2014  . Pulmonary embolism ([place]) [I26.99] 12/27/2013  . Vocal cord dysfunction [J38.3] 11/12/2013  . Mixed headache [R51] 01/06/2012  . OVERACTIVE BLADDER [N31.8] 04/18/2008  . Osteoarthritis of both knees [M17.0] 04/18/2008  . POLYARTHRITIS [M13.0] 09/14/2007  . Obesity [E66.9] 08/24/2006  . RESTLESS LEGS SYNDROME [G25.89] 08/24/2006  .  HYPERTENSION, BENIGN SYSTEMIC [I10] 08/24/2006  . RHINITIS, ALLERGIC [J30.9] 08/24/2006  . Asthma [J45.909] 08/24/2006  . GASTROESOPHAGEAL REFLUX, NO ESOPHAGITIS [K21.9] 08/24/2006   Total Time spent with patient: 25 minutes     Past Medical History:  Past Medical History  Diagnosis Date  . Asthma   . Hypertension   . GERD (gastroesophageal reflux disease)   . Anxiety   . Depression   . Family history of anesthesia complication     "daughter; causes her to pass out afterwards"  . Pulmonary embolism ([place]) 12/28/2013    "2 clots in each lung"  . Hyperthyroidism   . Migraine     "monthly" (12/28/2013)  . Osteoarthritis     "both knees; back of my neck; right pelvic bone" (12/28/2013)  . Chronic lower back pain   . Paroxysmal a-fib [facility])     Past Surgical History  Procedure Laterality Date  . Abdominal hysterectomy    . Wisdom tooth extraction    . Thyroidectomy, partial Right 2005  . Nasal sinus surgery  2007  . Appendectomy    . Excisional hemorrhoidectomy    . Dilation and curettage of uterus    . Tubal ligation    . Breast cyst excision Right   . Electrophysiologic study N/A 05/27/2015    Procedure: A-Flutter;  Surgeon: Evans Lance, MD;  Location: [place] [facility];  Service: Cardiovascular;  Laterality: N/A;   Family History:  Family History  Problem Relation Age of Onset  . Osteoarthritis Mother   . Asthma Mother   . Heart failure Mother     Social History:  History  Alcohol Use  . Yes    Comment: "  drank some in my 30's"     History  Drug Use No    Social History   Social History  . Marital Status: Single    Spouse Name: N/A  . Number of Children: N/A  . Years of Education: N/A   Social History Main Topics  . Smoking status: Former Smoker -- 0.25 packs/day for 20 years    Types: Cigarettes    Quit date: 07/17/1999  . Smokeless tobacco: Never Used  . Alcohol Use: Yes     Comment: "drank some in my 30's"  . Drug Use: No  . Sexual Activity: No    Other Topics Concern  . None   Social History Narrative   Additional Social History:   Sleep: Good  Appetite:  Good  Current Medications: Current Facility-Administered Medications  Medication Dose Route Frequency Provider Last Rate Last Dose  . acetaminophen (TYLENOL) tablet 650 mg  650 mg Oral Q6H PRN Kerrie Buffalo, NP   650 mg at 09/10/15 0805  . albuterol (PROVENTIL HFA;VENTOLIN HFA) 108 (90 Base) MCG/ACT inhaler 2 puff  2 puff Inhalation Q6H PRN Laverle Hobby, PA-C      . albuterol (PROVENTIL) (2.5 MG/3ML) 0.083% nebulizer solution 2.5 mg  2.5 mg Nebulization Q2H PRN Laverle Hobby, PA-C      . alum & mag hydroxide-simeth (MAALOX/MYLANTA) 200-200-20 MG/5ML suspension 30 mL  30 mL Oral Q4H PRN Laverle Hobby, PA-C      . amoxicillin (AMOXIL) capsule 500 mg  500 mg Oral Q12H Kerrie Buffalo, NP   500 mg at 09/13/15 0813  . ARIPiprazole (ABILIFY) tablet 5 mg  5 mg Oral QHS Laverle Hobby, PA-C   5 mg at 09/12/15 2121  . diclofenac sodium (VOLTAREN) 1 % transdermal gel 2 g  2 g Topical QID Jenne Campus, MD   Stopped at 09/12/15 [phone]  . diltiazem (CARDIZEM CD) 24 hr capsule 120 mg  120 mg Oral Daily Laverle Hobby, PA-C   120 mg at 09/13/15 0813  . flecainide (TAMBOCOR) tablet 100 mg  100 mg Oral Q12H Laverle Hobby, PA-C   100 mg at 09/13/15 0813  . guaiFENesin (MUCINEX) 12 hr tablet 600 mg  600 mg Oral BID Kerrie Buffalo, NP   600 mg at 09/13/15 0813  . hydrOXYzine (ATARAX/VISTARIL) tablet 25 mg  25 mg Oral Q6H PRN Laverle Hobby, PA-C   25 mg at 09/12/15 2121  . magnesium hydroxide (MILK OF MAGNESIA) suspension 30 mL  30 mL Oral Daily PRN Laverle Hobby, PA-C      . mirtazapine (REMERON) tablet 15 mg  15 mg Oral QHS Nicholaus Bloom, MD   15 mg at 09/12/15 2121  . mometasone-formoterol (DULERA) 200-5 MCG/ACT inhaler 2 puff  2 puff Inhalation BID Laverle Hobby, PA-C   2 puff at 09/13/15 0813  . pantoprazole (PROTONIX) EC tablet 40 mg  40 mg Oral Daily Laverle Hobby,  PA-C   40 mg at 09/13/15 0813  . potassium chloride SA (K-DUR,KLOR-CON) CR tablet 40 mEq  40 mEq Oral Daily Laverle Hobby, PA-C   40 mEq at 09/13/15 0813  . rivaroxaban (XARELTO) tablet 20 mg  20 mg Oral QPC supper Laverle Hobby, PA-C   20 mg at 09/12/15 1736  . senna-docusate (Senokot-S) tablet 1 tablet  1 tablet Oral QHS PRN Laverle Hobby, PA-C      . sertraline (ZOLOFT) tablet 100 mg  100 mg Oral Daily Geralyn Flash A  Sabra Heck, MD   100 mg at 09/13/15 0300    Lab Results:  No results found for this or any previous visit (from the past 25 hour(s)).  Blood Alcohol level:  Lab Results  Component Value Date   [facility] <5 09/06/2015   ETH <11 03/05/2014    Physical Findings: AIMS: Facial and Oral Movements Muscles of Facial Expression: None, normal Lips and Perioral Area: None, normal Jaw: None, normal Tongue: None, normal,Extremity Movements Upper (arms, wrists, hands, fingers): None, normal Lower (legs, knees, ankles, toes): None, normal, Trunk Movements Neck, shoulders, hips: None, normal, Overall Severity Severity of abnormal movements (highest score from questions above): None, normal Incapacitation due to abnormal movements: None, normal Patient's awareness of abnormal movements (rate only patient's report): No Awareness, Dental Status Current problems with teeth and/or dentures?: No Does patient usually wear dentures?: No  CIWA:    COWS:     Musculoskeletal: Strength & Muscle Tone: within normal limits Gait & Station: walks slowly with walker due to leg /knee pain  Patient leans: N/A  Psychiatric Specialty Exam: Review of Systems  Respiratory: Positive for cough.   Psychiatric/Behavioral: Positive for depression. The patient is nervous/anxious.   All other systems reviewed and are negative.   No chest pain, no SOB at this time . Bilateral knee pain    Blood pressure 124/83, pulse 97, temperature 98.3 F (36.8 C), temperature source Oral, resp. rate 16, height 5' 5.5" (1.664  m), weight 104.327 kg (230 lb).Body mass index is 37.68 kg/(m^2).  General Appearance: improved grooming   Eye Contact::  Good  Speech:  Normal Rate  Volume:  Normal  Mood:  Depressed, but better than on admission   Affect:  Less constricted, smiles at times appropriately   Thought Process:  Linear  Orientation:  Full (Time, Place, and Person)  Thought Content:  denies hallucinations, no delusions, not internally preoccupied   Suicidal Thoughts:  No at this time denies suicidal ideations and denies any self injurious ideations   Homicidal Thoughts:  No  Memory:  recent and remote grossly intact   Judgement:  Other:  improving   Insight:  improving   Psychomotor Activity:  Normal  Concentration:  Good  Recall:  Good  Fund of Knowledge:Good  Language: Good  Akathisia:  No  Handed:  Right  AIMS (if indicated):     Assets:  Desire for Improvement Resilience  ADL's:  Intact  Cognition: WNL  Sleep:  Number of Hours: 6.5  Assessment - patient remains depressed, and today expressing some anxiety that her roommate /friend is not answering her phone . Denies SI. States she still feels far from her normal mood /normal self, but acknowledges some improvement . Denies medication side effects. We have discussed alternatives for bilateral knee pain and as recommended, would consider Voltaren Gel topically to knees to address .   Treatment Plan Summary: Encourage ongoing milieu and group therapy participation to work on coping skills and symptom reduction Continue Zoloft   100 mgrs QDAY for depression Continue Remeron 15 mgrs QHS for depression and insomnia  Continue Abilify 5 mgrs QDAY for depression, mood disorder augmentation therapy Start Voltaren Gel for knee pain. Start Mucinex 600 mg BID cough Start Amoxicillin 500 mg BID x 7 days productive cough and general malaise As patient on Xarelto we have reviewed increased risk of bleeding/ GI bleed .   Janett Labella, NP [facility] 09/13/2015,  2:53 PM I agree with assessment and plan Geralyn Flash A. Sabra Heck, M.D.

## 2015-09-13 NOTE — Progress Notes (Signed)
Pt did not attend wrap up group meeting this evening.  

## 2015-09-14 NOTE — Progress Notes (Signed)
D:Pt is isolative to her room with c/o anxiety. Pt reports that she feels anxious about going back to her home. When asked what her biggest stress was, she responded financial stress. Pt says that she receives disability and has a room mate. She now has transportation arranged to help her with doctor's appointments. Pt c/o bilateral knee pain. A:Offered support, encouragement, anc 15 minute checks. Gave prn medication for knee pain. R:Pt denies si and hi. Safety maintained on the unit.

## 2015-09-14 NOTE — Progress Notes (Signed)
Patient ID: [name] White, female   DOB: 01/01/1956, 59 y.o.   MRN: [id number] [facility] MD Progress Note  09/14/2015 2:23 PM [name] White  MRN:  [id number] Subjective:   Patient states she is feeling better, but remains anxious and vaguely depressed, which she attributes to her psychosocial stressors, mainly financial and not living independently any longer. However, she states she is feeling less significantly upset about this issue, and states " I am grateful about the friend I live with, she is a good friend, good company", and states she keeps her from feeling alone . She is also somewhat more future oriented, discussing possible options , such as possibly bringing in some of her belongings, currently in storage, in order to feel more " at home" where she now lives, and trying to get someone to lend her some money so she can fix her car, and therefore have more independence of movement once she has a vehicle. Denies medication side effects . Objective : I have discussed case with treatment team and have met with patient. Visible on unit, going to groups, pleasant  on approach. Partial but significant improvement compared to admission- still depressed, ruminative, but as above, presenting with improved range of affect and more future oriented. Better able to think of ways to minimize her stressors, as above .  Denies medication side effects. Visible in milieu, going to some groups, behavior on unit in good control . Mobilizing with walker, ongoing bilateral knee pain, but improved with voltaren gel- denies side effects.     Principal Problem: MDD (major depressive disorder), recurrent episode, severe ([place]) Diagnosis:   Patient Active Problem List   Diagnosis Date Noted  . Severe episode of recurrent major depressive disorder, without psychotic features ([place]) [F33.2]   . MDD (major depressive disorder), recurrent episode, severe ([place]) [F33.2] 09/07/2015  . Atrial flutter ([place]) [I48.92]  07/29/2015  . History of atrial flutter [Z86.79] 06/26/2015  . Chest pain [R07.9] 03/22/2015  . SOB (shortness of breath) [R06.02]   . MDD (major depressive disorder), recurrent severe, without psychosis ([place]) [F33.2] 03/05/2014  . Pulmonary embolism ([place]) [I26.99] 12/27/2013  . Vocal cord dysfunction [J38.3] 11/12/2013  . Mixed headache [R51] 01/06/2012  . OVERACTIVE BLADDER [N31.8] 04/18/2008  . Osteoarthritis of both knees [M17.0] 04/18/2008  . POLYARTHRITIS [M13.0] 09/14/2007  . Obesity [E66.9] 08/24/2006  . RESTLESS LEGS SYNDROME [G25.89] 08/24/2006  . HYPERTENSION, BENIGN SYSTEMIC [I10] 08/24/2006  . RHINITIS, ALLERGIC [J30.9] 08/24/2006  . Asthma [J45.909] 08/24/2006  . GASTROESOPHAGEAL REFLUX, NO ESOPHAGITIS [K21.9] 08/24/2006   Total Time spent with patient: 25 minutes     Past Medical History:  Past Medical History  Diagnosis Date  . Asthma   . Hypertension   . GERD (gastroesophageal reflux disease)   . Anxiety   . Depression   . Family history of anesthesia complication     "daughter; causes her to pass out afterwards"  . Pulmonary embolism ([place]) 12/28/2013    "2 clots in each lung"  . Hyperthyroidism   . Migraine     "monthly" (12/28/2013)  . Osteoarthritis     "both knees; back of my neck; right pelvic bone" (12/28/2013)  . Chronic lower back pain   . Paroxysmal a-fib [facility])     Past Surgical History  Procedure Laterality Date  . Abdominal hysterectomy    . Wisdom tooth extraction    . Thyroidectomy, partial Right 2005  . Nasal sinus surgery  2007  . Appendectomy    .  Excisional hemorrhoidectomy    . Dilation and curettage of uterus    . Tubal ligation    . Breast cyst excision Right   . Electrophysiologic study N/A 05/27/2015    Procedure: A-Flutter;  Surgeon: White Lance, MD;  Location: [place] [facility];  Service: Cardiovascular;  Laterality: N/A;   Family History:  Family History  Problem Relation Age of Onset  . Osteoarthritis Mother   .  Asthma Mother   . Heart failure Mother     Social History:  History  Alcohol Use  . Yes    Comment: "drank some in my 30's"     History  Drug Use No    Social History   Social History  . Marital Status: Single    Spouse Name: N/A  . Number of Children: N/A  . Years of Education: N/A   Social History Main Topics  . Smoking status: Former Smoker -- 0.25 packs/day for 20 years    Types: Cigarettes    Quit date: 07/17/1999  . Smokeless tobacco: Never Used  . Alcohol Use: Yes     Comment: "drank some in my 30's"  . Drug Use: No  . Sexual Activity: No   Other Topics Concern  . None   Social History Narrative   Additional Social History:   Sleep: Good  Appetite:  Good  Current Medications: Current Facility-Administered Medications  Medication Dose Route Frequency Provider Last Rate Last Dose  . acetaminophen (TYLENOL) tablet 650 mg  650 mg Oral Q6H PRN Kerrie Buffalo, NP   650 mg at 09/14/15 1128  . albuterol (PROVENTIL HFA;VENTOLIN HFA) 108 (90 Base) MCG/ACT inhaler 2 puff  2 puff Inhalation Q6H PRN Laverle Hobby, PA-C      . albuterol (PROVENTIL) (2.5 MG/3ML) 0.083% nebulizer solution 2.5 mg  2.5 mg Nebulization Q2H PRN Laverle Hobby, PA-C      . alum & mag hydroxide-simeth (MAALOX/MYLANTA) 200-200-20 MG/5ML suspension 30 mL  30 mL Oral Q4H PRN Laverle Hobby, PA-C      . amoxicillin (AMOXIL) capsule 500 mg  500 mg Oral Q12H Kerrie Buffalo, NP   500 mg at 09/14/15 7673  . ARIPiprazole (ABILIFY) tablet 5 mg  5 mg Oral QHS Laverle Hobby, PA-C   5 mg at 09/13/15 2109  . diclofenac sodium (VOLTAREN) 1 % transdermal gel 2 g  2 g Topical QID Jenne Campus, MD   2 g at 09/13/15 2109  . diltiazem (CARDIZEM CD) 24 hr capsule 120 mg  120 mg Oral Daily Laverle Hobby, PA-C   120 mg at 09/14/15 4193  . flecainide (TAMBOCOR) tablet 100 mg  100 mg Oral Q12H Laverle Hobby, PA-C   100 mg at 09/14/15 7902  . guaiFENesin (MUCINEX) 12 hr tablet 600 mg  600 mg Oral BID  Kerrie Buffalo, NP   600 mg at 09/14/15 4097  . hydrOXYzine (ATARAX/VISTARIL) tablet 25 mg  25 mg Oral Q6H PRN Laverle Hobby, PA-C   25 mg at 09/13/15 2108  . magnesium hydroxide (MILK OF MAGNESIA) suspension 30 mL  30 mL Oral Daily PRN Laverle Hobby, PA-C      . mirtazapine (REMERON) tablet 15 mg  15 mg Oral QHS Nicholaus Bloom, MD   15 mg at 09/13/15 2109  . mometasone-formoterol (DULERA) 200-5 MCG/ACT inhaler 2 puff  2 puff Inhalation BID Laverle Hobby, PA-C   2 puff at 09/14/15 3532  . pantoprazole (PROTONIX) EC tablet 40  mg  40 mg Oral Daily Laverle Hobby, PA-C   40 mg at 09/14/15 1937  . potassium chloride SA (K-DUR,KLOR-CON) CR tablet 40 mEq  40 mEq Oral Daily Laverle Hobby, PA-C   40 mEq at 09/14/15 9024  . rivaroxaban (XARELTO) tablet 20 mg  20 mg Oral QPC supper Laverle Hobby, PA-C   20 mg at 09/13/15 1841  . senna-docusate (Senokot-S) tablet 1 tablet  1 tablet Oral QHS PRN Laverle Hobby, PA-C      . sertraline (ZOLOFT) tablet 100 mg  100 mg Oral Daily Nicholaus Bloom, MD   100 mg at 09/14/15 0973    Lab Results:  No results found for this or any previous visit (from the past 48 hour(s)).  Blood Alcohol level:  Lab Results  Component Value Date   [facility] <5 09/06/2015   ETH <11 03/05/2014    Physical Findings: AIMS: Facial and Oral Movements Muscles of Facial Expression: None, normal Lips and Perioral Area: None, normal Jaw: None, normal Tongue: None, normal,Extremity Movements Upper (arms, wrists, hands, fingers): None, normal Lower (legs, knees, ankles, toes): None, normal, Trunk Movements Neck, shoulders, hips: None, normal, Overall Severity Severity of abnormal movements (highest score from questions above): None, normal Incapacitation due to abnormal movements: None, normal Patient's awareness of abnormal movements (rate only patient's report): No Awareness, Dental Status Current problems with teeth and/or dentures?: No Does patient usually wear dentures?: No   CIWA:    COWS:     Musculoskeletal: Strength & Muscle Tone: within normal limits Gait & Station: walks slowly with walker due to leg /knee pain  Patient leans: N/A  Psychiatric Specialty Exam: ROS  No chest pain, no SOB at this time . Bilateral knee pain    Blood pressure 134/87, pulse 101, temperature 99.3 F (37.4 C), temperature source Oral, resp. rate 17, height 5' 5.5" (1.664 m), weight 230 lb (104.327 kg).Body mass index is 37.68 kg/(m^2).  General Appearance: improved grooming   Eye Contact::  Good  Speech:  Normal Rate  Volume:  Normal  Mood:  Still feels depressed, but improved compared to admission  Affect:  Less constricted, more reactive affect   Thought Process:  Linear  Orientation:  Full (Time, Place, and Person)  Thought Content:  denies hallucinations, no delusions, not internally preoccupied - remains ruminative, but less hopeless, about stressors   Suicidal Thoughts:  No at this time denies suicidal ideations and denies any self injurious ideations   Homicidal Thoughts:  No  Memory:  recent and remote grossly intact   Judgement:  Other:  improving   Insight:  improving   Psychomotor Activity:  Normal  Concentration:  Good  Recall:  Good  Fund of Knowledge:Good  Language: Good  Akathisia:  No  Handed:  Right  AIMS (if indicated):     Assets:  Desire for Improvement Resilience  ADL's:  Intact  Cognition: WNL  Sleep:  Number of Hours: 6.5  Assessment - patient improving compared to admission- still anxious and vaguely depressed, but overall improved mood, improved range of affect, and more future oriented. Tolerating medications well . Denies SI.  Treatment Plan Summary: Encourage ongoing milieu and group therapy participation to work on coping skills and symptom reduction Continue Zoloft   100 mgrs QDAY for depression Continue Remeron 15 mgrs QHS for depression and insomnia  Continue Abilify 5 mgrs QDAY for depression, mood disorder augmentation  therapy Continue  Voltaren Gel for knee pain .  Treatment team  working on disposition planning   Neita Garnet, MD 09/14/2015, 2:23 PM

## 2015-09-14 NOTE — Progress Notes (Signed)
Pt did not attend wrap up group this evening.  

## 2015-09-14 NOTE — BHH Group Notes (Signed)
[place] LCSW Group Therapy 09/14/2015 1:15 pm  Type of Therapy: Group Therapy Participation Level: Minimal  Participation Quality: Attentive  Affect: Blunted  Cognitive: Alert and Oriented  Insight: Developing/Improving and Engaged  Engagement in Therapy: Developing/Improving and Engaged  Modes of Intervention: Clarification, Confrontation, Discussion, Education, Exploration,  Limit-setting, Orientation, Problem-solving, Rapport Building, Art therapist, Socialization and Support  Summary of Progress/Problems: Pt identified obstacles faced currently and processed barriers involved in overcoming these obstacles. Pt identified steps necessary for overcoming these obstacles and explored motivation (internal and external) for facing these difficulties head on. Pt further identified one area of concern in their lives and chose a goal to focus on for today. Patient participated minimally in discussion despite CSW encouragement. She did share feeling "frustrated" that she is not able to find a solution to her obstacles that she is facing.   Tilden Fossa, LCSW Clinical Social Worker [facility] [phone]

## 2015-09-14 NOTE — Progress Notes (Signed)
Recreation Therapy Notes  Date: 03.20.2017 Time: 9:30am Location: [address] Group Room   Group Topic: Stress Management  Goal Area(s) Addresses:  Patient will actively participate in stress management techniques presented during session.   Behavioral Response: Did not attend.    Laureen Ochs Gurpreet Mikhail, LRT/CTRS        Mairen Wallenstein L 09/14/2015 2:02 PM

## 2015-09-14 NOTE — Progress Notes (Signed)
D: Pt has depressed affect and mood.  Pt reports her goal today was to "be more interactive with other patients here."  Pt denies SI/HI, denies hallucinations, reports chronic bilateral knee pain of 8/10.  Pt has been visible in milieu with minimal peer interaction.  She did not attend evening group.  Pt has been ambulating with assistance of a walker.  Pt reports she may discharge soon and that she does not feel safe to discharge at this time. A: Introduced self to pt.  Actively listened to pt and offered support and encouragement.  Medications administered per order.  PRN medication administered for pain and anxiety. R: Pt is compliant with medications.  Pt verbally contracts for safety.  Will continue to monitor and assess.

## 2015-09-14 NOTE — BHH Group Notes (Signed)
[facility] LCSW Aftercare Discharge Planning Group Note  09/14/2015 8:45 AM  Pt did not attend, declined invitation.   Peri Maris, LCSWA 09/14/2015 9:25 AM

## 2015-09-15 MED ORDER — DILTIAZEM HCL ER COATED BEADS 120 MG PO CP24: 120.0000 mg | ORAL_CAPSULE | Freq: Every day | ORAL | Status: DC

## 2015-09-15 MED ORDER — SERTRALINE HCL 50 MG PO TABS
150.0000 mg | ORAL_TABLET | Freq: Every day | ORAL | Status: DC
  Administered 2015-09-16: 150 mg via ORAL
  Filled 2015-09-15 (×3): qty 1

## 2015-09-15 MED ORDER — ARIPIPRAZOLE 5 MG PO TABS: 5.0000 mg | ORAL_TABLET | Freq: Every day | ORAL | Status: DC

## 2015-09-15 MED ORDER — POTASSIUM CHLORIDE CRYS ER 20 MEQ PO TBCR: 40.0000 meq | EXTENDED_RELEASE_TABLET | Freq: Every day | ORAL | Status: DC

## 2015-09-15 MED ORDER — MIRTAZAPINE 15 MG PO TABS: 15.0000 mg | ORAL_TABLET | Freq: Every day | ORAL | Status: DC

## 2015-09-15 MED ORDER — FLECAINIDE ACETATE 100 MG PO TABS: 100.0000 mg | ORAL_TABLET | Freq: Two times a day (BID) | ORAL | Status: DC

## 2015-09-15 MED ORDER — ENSURE ENLIVE PO LIQD: 237.0000 mL | Freq: Two times a day (BID) | ORAL | Status: DC

## 2015-09-15 MED ORDER — SERTRALINE HCL 100 MG PO TABS: 100.0000 mg | ORAL_TABLET | Freq: Every day | ORAL | Status: DC

## 2015-09-15 MED ORDER — AMOXICILLIN 500 MG PO CAPS: 500.0000 mg | ORAL_CAPSULE | Freq: Two times a day (BID) | ORAL | Status: DC

## 2015-09-15 MED ORDER — RIVAROXABAN 20 MG PO TABS: 20.0000 mg | ORAL_TABLET | Freq: Every day | ORAL | Status: DC

## 2015-09-15 NOTE — BHH Group Notes (Signed)
[place] LCSW Group Therapy  09/15/2015   1:15 PM   Type of Therapy:  Group Therapy  Participation Level:  Active  Participation Quality:  Attentive, Sharing and Supportive  Affect:  Minimally  Cognitive:  Alert and Oriented  Insight:  Developing/Improving and Engaged  Engagement in Therapy:  Developing/Improving and Engaged  Modes of Intervention:  Clarification, Confrontation, Discussion, Education, Exploration, Limit-setting, Orientation, Problem-solving, Rapport Building, Art therapist, Socialization and Support  Summary of Progress/Problems: The topic for group therapy was feelings about diagnosis.  Pt actively participated in group discussion on their past and current diagnosis and how they feel towards this.  Pt also identified how society and family members judge them, based on their diagnosis as well as stereotypes and stigmas.  Patient participated minimally in discussion despite CSW encouragement.  Tilden Fossa, MSW, [place] Clinical Social Worker [facility] [phone]

## 2015-09-15 NOTE — Progress Notes (Signed)
Patient ID: [name] White, female   DOB: March 29, 1956, 60 y.o.   MRN: [id number] [facility] MD Progress Note  09/15/2015 4:48 PM [name] White  MRN:  [id number] Subjective:   Patient reports worsening anxiety, and feeling more depressed, as she approaches discharge. She states " I definitely do not feel ready to discharge today". She does, however, acknowledge significant improvement compared to admission , and has been focusing more on discharge recently, although expressing anxiety symptoms. Reports her knee pain " always there but not that bad today".  Denies medication side effects . Objective : I have discussed case with treatment team and have met with patient. Visible on unit, going to groups. Today reporting increased anxiety and worry as she approaches discharge. Anxious about possible discharge today, stating she does not feel ready, due to increased anxiety.   Denies medication side effects.     Principal Problem: MDD (major depressive disorder), recurrent episode, severe ([place]) Diagnosis:   Patient Active Problem List   Diagnosis Date Noted  . Severe episode of recurrent major depressive disorder, without psychotic features ([place]) [F33.2]   . MDD (major depressive disorder), recurrent episode, severe ([place]) [F33.2] 09/07/2015  . Atrial flutter ([place]) [I48.92] 07/29/2015  . History of atrial flutter [Z86.79] 06/26/2015  . Chest pain [R07.9] 03/22/2015  . SOB (shortness of breath) [R06.02]   . MDD (major depressive disorder), recurrent severe, without psychosis ([place]) [F33.2] 03/05/2014  . Pulmonary embolism ([place]) [I26.99] 12/27/2013  . Vocal cord dysfunction [J38.3] 11/12/2013  . Mixed headache [R51] 01/06/2012  . OVERACTIVE BLADDER [N31.8] 04/18/2008  . Osteoarthritis of both knees [M17.0] 04/18/2008  . POLYARTHRITIS [M13.0] 09/14/2007  . Obesity [E66.9] 08/24/2006  . RESTLESS LEGS SYNDROME [G25.89] 08/24/2006  . HYPERTENSION, BENIGN SYSTEMIC [I10] 08/24/2006  . RHINITIS,  ALLERGIC [J30.9] 08/24/2006  . Asthma [J45.909] 08/24/2006  . GASTROESOPHAGEAL REFLUX, NO ESOPHAGITIS [K21.9] 08/24/2006   Total Time spent with patient: 20 minutes     Past Medical History:  Past Medical History  Diagnosis Date  . Asthma   . Hypertension   . GERD (gastroesophageal reflux disease)   . Anxiety   . Depression   . Family history of anesthesia complication     "daughter; causes her to pass out afterwards"  . Pulmonary embolism ([place]) 12/28/2013    "2 clots in each lung"  . Hyperthyroidism   . Migraine     "monthly" (12/28/2013)  . Osteoarthritis     "both knees; back of my neck; right pelvic bone" (12/28/2013)  . Chronic lower back pain   . Paroxysmal a-fib [facility])     Past Surgical History  Procedure Laterality Date  . Abdominal hysterectomy    . Wisdom tooth extraction    . Thyroidectomy, partial Right 2005  . Nasal sinus surgery  2007  . Appendectomy    . Excisional hemorrhoidectomy    . Dilation and curettage of uterus    . Tubal ligation    . Breast cyst excision Right   . Electrophysiologic study N/A 05/27/2015    Procedure: A-Flutter;  Surgeon: Evans Lance, MD;  Location: [place] [facility];  Service: Cardiovascular;  Laterality: N/A;   Family History:  Family History  Problem Relation Age of Onset  . Osteoarthritis Mother   . Asthma Mother   . Heart failure Mother     Social History:  History  Alcohol Use  . Yes    Comment: "drank some in my 30's"     History  Drug Use  No    Social History   Social History  . Marital Status: Single    Spouse Name: N/A  . Number of Children: N/A  . Years of Education: N/A   Social History Main Topics  . Smoking status: Former Smoker -- 0.25 packs/day for 20 years    Types: Cigarettes    Quit date: 07/17/1999  . Smokeless tobacco: Never Used  . Alcohol Use: Yes     Comment: "drank some in my 30's"  . Drug Use: No  . Sexual Activity: No   Other Topics Concern  . None   Social History  Narrative   Additional Social History:   Sleep: Good  Appetite:  Good  Current Medications: Current Facility-Administered Medications  Medication Dose Route Frequency Provider Last Rate Last Dose  . acetaminophen (TYLENOL) tablet 650 mg  650 mg Oral Q6H PRN Kerrie Buffalo, NP   650 mg at 09/14/15 1128  . albuterol (PROVENTIL HFA;VENTOLIN HFA) 108 (90 Base) MCG/ACT inhaler 2 puff  2 puff Inhalation Q6H PRN Laverle Hobby, PA-C      . albuterol (PROVENTIL) (2.5 MG/3ML) 0.083% nebulizer solution 2.5 mg  2.5 mg Nebulization Q2H PRN Laverle Hobby, PA-C      . alum & mag hydroxide-simeth (MAALOX/MYLANTA) 200-200-20 MG/5ML suspension 30 mL  30 mL Oral Q4H PRN Laverle Hobby, PA-C      . amoxicillin (AMOXIL) capsule 500 mg  500 mg Oral Q12H Kerrie Buffalo, NP   500 mg at 09/15/15 8099  . ARIPiprazole (ABILIFY) tablet 5 mg  5 mg Oral QHS Laverle Hobby, PA-C   5 mg at 09/14/15 2154  . diclofenac sodium (VOLTAREN) 1 % transdermal gel 2 g  2 g Topical QID Jenne Campus, MD   2 g at 09/14/15 2157  . diltiazem (CARDIZEM CD) 24 hr capsule 120 mg  120 mg Oral Daily Laverle Hobby, PA-C   120 mg at 09/15/15 0809  . feeding supplement (ENSURE ENLIVE) (ENSURE ENLIVE) liquid 237 mL  237 mL Oral BID BM Myer Peer Cobos, MD   237 mL at 09/15/15 1000  . flecainide (TAMBOCOR) tablet 100 mg  100 mg Oral Q12H Laverle Hobby, PA-C   100 mg at 09/15/15 0809  . guaiFENesin (MUCINEX) 12 hr tablet 600 mg  600 mg Oral BID Kerrie Buffalo, NP   600 mg at 09/15/15 8338  . hydrOXYzine (ATARAX/VISTARIL) tablet 25 mg  25 mg Oral Q6H PRN Laverle Hobby, PA-C   25 mg at 09/15/15 1312  . magnesium hydroxide (MILK OF MAGNESIA) suspension 30 mL  30 mL Oral Daily PRN Laverle Hobby, PA-C      . mirtazapine (REMERON) tablet 15 mg  15 mg Oral QHS Nicholaus Bloom, MD   15 mg at 09/14/15 2154  . mometasone-formoterol (DULERA) 200-5 MCG/ACT inhaler 2 puff  2 puff Inhalation BID Laverle Hobby, PA-C   2 puff at 09/15/15 0809  .  pantoprazole (PROTONIX) EC tablet 40 mg  40 mg Oral Daily Laverle Hobby, PA-C   40 mg at 09/15/15 0809  . potassium chloride SA (K-DUR,KLOR-CON) CR tablet 40 mEq  40 mEq Oral Daily Laverle Hobby, PA-C   40 mEq at 09/15/15 0809  . rivaroxaban (XARELTO) tablet 20 mg  20 mg Oral QPC supper Laverle Hobby, PA-C   20 mg at 09/14/15 1732  . senna-docusate (Senokot-S) tablet 1 tablet  1 tablet Oral QHS PRN Laverle Hobby, PA-C      .  sertraline (ZOLOFT) tablet 100 mg  100 mg Oral Daily Nicholaus Bloom, MD   100 mg at 09/15/15 8315    Lab Results:  No results found for this or any previous visit (from the past 40 hour(s)).  Blood Alcohol level:  Lab Results  Component Value Date   [facility] <5 09/06/2015   ETH <11 03/05/2014    Physical Findings: AIMS: Facial and Oral Movements Muscles of Facial Expression: None, normal Lips and Perioral Area: None, normal Jaw: None, normal Tongue: None, normal,Extremity Movements Upper (arms, wrists, hands, fingers): None, normal Lower (legs, knees, ankles, toes): None, normal, Trunk Movements Neck, shoulders, hips: None, normal, Overall Severity Severity of abnormal movements (highest score from questions above): None, normal Incapacitation due to abnormal movements: None, normal Patient's awareness of abnormal movements (rate only patient's report): No Awareness, Dental Status Current problems with teeth and/or dentures?: No Does patient usually wear dentures?: No  CIWA:    COWS:     Musculoskeletal: Strength & Muscle Tone: within normal limits Gait & Station: walks slowly with walker due to leg /knee pain  Patient leans: N/A  Psychiatric Specialty Exam: ROS  No chest pain, no SOB at this time . Bilateral knee pain    Blood pressure 115/62, pulse 75, temperature 98.4 F (36.9 C), temperature source Oral, resp. rate 17, height 5' 5.5" (1.664 m), weight 230 lb (104.327 kg).Body mass index is 37.68 kg/(m^2).  General Appearance: improved grooming    Eye Contact::  Good  Speech:  Normal Rate  Volume:  Normal  Mood:  Mood has improved significantly compared to admission, but today feeling more anxious and reports some increased depression as she focuses on upcoming discharge   Affect:  More anxious today   Thought Process:  Linear  Orientation:  Full (Time, Place, and Person)  Thought Content:  denies hallucinations, no delusions, not internally preoccupied - remains ruminative, but less hopeless, about stressors   Suicidal Thoughts:  No at this time denies suicidal ideations and denies any self injurious ideations   Homicidal Thoughts:  No  Memory:  recent and remote grossly intact   Judgement:  Other:  improving   Insight:  improving   Psychomotor Activity:  Normal  Concentration:  Good  Recall:  Good  Fund of Knowledge:Good  Language: Good  Akathisia:  No  Handed:  Right  AIMS (if indicated):     Assets:  Desire for Improvement Resilience  ADL's:  Intact  Cognition: WNL  Sleep:  Number of Hours: 6.75  Assessment - patient has improved compared to admission- less depressed, more reactive affect. Today , however, reporting increased anxiety, worry, particularly when discussing possible discharge home later today. Responded to support, empathy, affect improved partially, but does repot overall increased anxiety. Denies medication side effects.  Treatment Plan Summary: Encourage ongoing milieu and group therapy participation to work on coping skills and symptom reduction Increase  Zoloft   To 150  mgrs QDAY for depression, ongoing anxiety symptoms Continue Remeron 15 mgrs QHS for depression and insomnia  Continue Abilify 5 mgrs QDAY for depression, mood disorder augmentation therapy Continue  Voltaren Gel for knee pain .  Treatment team working on disposition [place], [place], MD 09/15/2015, 4:48 PM

## 2015-09-15 NOTE — Progress Notes (Signed)
D: Pt has anxious affect and mood.  Pt reports her day was "better."  She reports she "set a target date for Wednesday" for potential discharge.  Pt reports she feels "good" about this.  Pt's reported goal is "to not be so apprehensive about going home."  Pt denies SI/HI, denies hallucinations, denies pain.  Pt has been less visible in milieu tonight.  She did not attend evening group.   A: Actively listened to pt and offered support and encouragement.  PRN medication administered for anxiety.  Medications administered per order.   R: Pt reports feeling better after taking a bath.  Pt is compliant with medications.  She verbally contracts for safety.  Will continue to monitor and assess.

## 2015-09-15 NOTE — Progress Notes (Signed)
  [facility] Adult Case Management Discharge Plan :  Will you be returning to the same living situation after discharge:  Yes,  patient plans to return home At discharge, do you have transportation home?: Yes,  patient reports access to transportation Do you have the ability to pay for your medications: Yes,  patient will be provided with prescriptions at discharge  Release of information consent forms completed and in the chart;  Patient's signature needed at discharge.  Patient to Follow up at: Follow-up Information    Follow up with [facility] On 09/17/2015.   Specialty:  Behavioral Health   Why:  Medication management appointment with Alemu Mengistu on Thursday March 23rd at 1:40pm. Call office if you need to reschedule appointment. Please ask about therapy services during visit.    Contact information:   [place] [address] [phone]       Next level of care provider has access to [place] and Suicide Prevention discussed: Yes,  with patient  Have you used any form of tobacco in the last 30 days? (Cigarettes, Smokeless Tobacco, Cigars, and/or Pipes): No  Has patient been referred to the Quitline?: N/A patient is not a smoker  Patient has been referred for addiction treatment: N/A  Arneta Mahmood, Casimiro Needle 09/15/2015, 10:46 AM

## 2015-09-15 NOTE — Tx Team (Signed)
Interdisciplinary Treatment Plan Update (Adult) Date: 09/15/2015    Time Reviewed: 9:30 AM  Progress in Treatment: Attending groups: Yes Participating in groups: Minimally Taking medication as prescribed: Yes Tolerating medication: Yes Family/Significant other contact made: No, CSW has attempted to contact daughter Patient understands diagnosis: Yes Discussing patient identified problems/goals with staff: Yes Medical problems stabilized or resolved: Yes Denies suicidal/homicidal ideation: Yes Issues/concerns per patient self-inventory: Yes Other:  New problem(s) identified: N/A  Discharge Plan or Barriers: Patient will return to follow up with outpatient services at [facility] and TCT services.   Reason for Continuation of Hospitalization:  Depression Anxiety Medication Stabilization   Comments: N/A  Estimated length of stay: Discharge anticipated for today 09/15/15    Patient is a 60 year old female with a diagnosis of Major Depressive Disorder. Pt presented to the hospital with increased depression and suicidal ideations. Pt reports primary trigger(s) for admission were financial, housing, and social stressors. Patient will benefit from crisis stabilization, medication evaluation, group therapy and psycho education in addition to case management for discharge planning. At discharge, it is recommended that Pt remain compliant with established discharge plan and continued treatment.   Review of initial/current patient goals per problem list:  1. Goal(s): Patient will participate in aftercare plan   Met: Yes   Target date: 3-5 days post admission date   As evidenced by: Patient will participate within aftercare plan AEB aftercare provider and housing plan at discharge being identified.   3/14: Goal not met: CSW assessing for appropriate referrals for pt and will have follow up secured prior to d/c.  3/17: Goal met. Patient will return home to follow up with outpatient  services at [facility] and TCT services.    2. Goal (s): Patient will exhibit decreased depressive symptoms and suicidal ideations.   Met: Adequate for discharge per MD   Target date: 3-5 days post admission date   As evidenced by: Patient will utilize self rating of depression at 3 or below and demonstrate decreased signs of depression or be deemed stable for discharge by MD.  3/14: Goal not met: Pt presents with flat affect and depressed mood.  Pt admitted with depression rating of 10.  Pt to show decreased sign of depression and a rating of 3 or less before d/c.    3/17: Goal progressing. Patient continues to isolate from milieu and presents with flat and depressed affect.  3/21: Adequate for discharge per MD. Patient reports improvements in her depression and denies SI.      3. Goal(s): Patient will demonstrate decreased signs and symptoms of anxiety.   Met: Adequate for discharge per MD   Target date: 3-5 days post admission date   As evidenced by: Patient will utilize self rating of anxiety at 3 or below and demonstrated decreased signs of anxiety, or be deemed stable for discharge by MD  3/14: Goal not met: Pt presents with anxious mood and affect.  Pt admitted with anxiety rating of 10.  Pt to show decreased sign of anxiety and a rating of 3 or less before d/c.  3/17: Goal progressing.  3/21: Adequate for discharge per MD. Patient reports feeling anxious about returning home due to financial stress.     Attendees: Patient:    Family:    Physician: Dr. Parke Poisson; Dr. Sabra Heck 09/15/2015 9:30 AM  Nursing: Grayland Ormond, Elesa Massed, Christa Haskell Riling, RN 09/15/2015 9:30 AM  Clinical Social Worker: Tilden Fossa, LCSW 09/15/2015 9:30 AM  Other: Peri Maris, LCSWA;  [place], LCSW  09/15/2015 9:30 AM  Other:  09/15/2015 9:30 AM  Other: Lars Pinks, Case Manager 09/15/2015 9:30 AM  Other: Agustina Caroli, May Augustin, NP 09/15/2015  9:30 AM  Other:              Scribe for Treatment Team:  Tilden Fossa, [place]

## 2015-09-15 NOTE — Progress Notes (Signed)
Recreation Therapy Notes  Animal-Assisted Activity (AAA) Program Checklist/Progress Notes Patient Eligibility Criteria Checklist & Daily Group note for Rec Tx Intervention  Date: 03.21.2017 Time: 2:45pm Location: [address]    AAA/T Program Assumption of Risk Form signed by Patient/ or Parent Legal Guardian yes  Patient is free of allergies or sever asthma yes  Patient reports no fear of animals yes  Patient reports no history of cruelty to animals yes  Patient understands his/her participation is voluntary yes  Behavioral Response: Did not attend.   Laureen Ochs Nhyira Leano, LRT/CTRS         Lane Hacker 09/15/2015 3:59 PM

## 2015-09-15 NOTE — Progress Notes (Signed)
Patient ID: [name] White, female   DOB: 1956/03/20, 60 y.o.   MRN: [id number]  DAR: Pt. Denies SI/HI and A/V Hallucinations. Patient continues to report pain in bilateral knees however refuses intervention at this time. Support and encouragement provided to the patient. Scheduled medications administered to patient per physician's orders however she continues to refuse the Voltaren gel. She came to Probation officer this afternoon reporting high anxiety related to discharging tomorrow. Writer spoke 1:1 with patient and administered PRN Vistaril. Patient reported relief on reassessment. Patient is cooperative but remains minimal and isolative. Patient encouraged to come out into the milieu. Q15 minute checks are maintained for safety.

## 2015-09-15 NOTE — BHH Group Notes (Signed)
[place] Group Notes:  (Nursing/MHT/Case Management/Adjunct)  Date:  09/15/2015  Time:  11:25 AM  Type of Therapy:  Psychoeducational Skills  Participation Level:  Minimal  Participation Quality:  Appropriate  Affect:  Appropriate  Cognitive:  Appropriate  Insight:  Appropriate  Engagement in Group:  Engaged  Modes of Intervention:  Discussion and Education  Summary of Progress/Problems: [name] White came to group and received daily booklet.   [name] E 09/15/2015, 11:25 AM

## 2015-09-16 MED ORDER — SERTRALINE HCL 50 MG PO TABS: 150.0000 mg | ORAL_TABLET | Freq: Every day | ORAL | Status: DC

## 2015-09-16 NOTE — BHH Suicide Risk Assessment (Signed)
[facility] Discharge Suicide Risk Assessment   Principal Problem: MDD (major depressive disorder), recurrent episode, severe ([place]) Discharge Diagnoses:  Patient Active Problem List   Diagnosis Date Noted  . Severe episode of recurrent major depressive disorder, without psychotic features ([place]) [F33.2]   . MDD (major depressive disorder), recurrent episode, severe ([place]) [F33.2] 09/07/2015  . Atrial flutter ([place]) [I48.92] 07/29/2015  . History of atrial flutter [Z86.79] 06/26/2015  . Chest pain [R07.9] 03/22/2015  . SOB (shortness of breath) [R06.02]   . MDD (major depressive disorder), recurrent severe, without psychosis ([place]) [F33.2] 03/05/2014  . Pulmonary embolism ([place]) [I26.99] 12/27/2013  . Vocal cord dysfunction [J38.3] 11/12/2013  . Mixed headache [R51] 01/06/2012  . OVERACTIVE BLADDER [N31.8] 04/18/2008  . Osteoarthritis of both knees [M17.0] 04/18/2008  . POLYARTHRITIS [M13.0] 09/14/2007  . Obesity [E66.9] 08/24/2006  . RESTLESS LEGS SYNDROME [G25.89] 08/24/2006  . HYPERTENSION, BENIGN SYSTEMIC [I10] 08/24/2006  . RHINITIS, ALLERGIC [J30.9] 08/24/2006  . Asthma [J45.909] 08/24/2006  . GASTROESOPHAGEAL REFLUX, NO ESOPHAGITIS [K21.9] 08/24/2006    Total Time spent with patient: 30 minutes  Musculoskeletal: Strength & Muscle Tone: within normal limits Gait & Station: normal- walks with walker due to chronic knee pain Patient leans: N/A  Psychiatric Specialty Exam: ROS  Blood pressure 126/75, pulse 98, temperature 98.1 F (36.7 C), temperature source Oral, resp. rate 16, height 5' 5.5" (1.664 m), weight 230 lb (104.327 kg).Body mass index is 37.68 kg/(m^2).  General Appearance: Well Groomed  Eye Contact::  Good  Speech:  Normal Rate409  Volume:  Normal  Mood:  improved, less depressed  Affect:  Appropriate and more reactive   Thought Process:  Linear  Orientation:  Full (Time, Place, and Person)  Thought Content:  no hallucinations, no delusions, less ruminative  Suicidal  Thoughts:  No denies any suicidal or self injurious ideations   Homicidal Thoughts:  No  Memory:  recent and remote grossly intact   Judgement:  Other:  improved   Insight:  Present  Psychomotor Activity:  Normal  Concentration:  Good  Recall:  Good  Fund of Knowledge:Good  Language: Good  Akathisia:  Negative  Handed:  Right  AIMS (if indicated):     Assets:  Communication Skills Desire for Improvement Resilience  Sleep:  Number of Hours: 6.5  Cognition: WNL  ADL's:  Intact   Mental Status Per Nursing Assessment::   On Admission:  Suicidal ideation indicated by patient, Self-harm thoughts  Demographic Factors:  60 year old female, lives with a friend from her church   Loss Factors: Had lost her home , so now living with friend, no vehicle at this time, limited financial resources , chronic bilateral knee pain  Historical Factors: History of depression, history of prior psychiatric admissions   Risk Reduction Factors:   Sense of responsibility to family, Living with another person, especially a relative and Positive coping skills or problem solving skills  Continued Clinical Symptoms:  At this time patient is improved compared to admission- mood improved , affect brighter, no thought disorder, no SI or HI, no psychotic symptoms, future oriented. Tolerating medications well   Cognitive Features That Contribute To Risk:  No gross cognitive deficits noted upon discharge. Is alert , attentive, and oriented x 3   Suicide Risk:  Mild:  Suicidal ideation of limited frequency, intensity, duration, and specificity.  There are no identifiable plans, no associated intent, mild dysphoria and related symptoms, good self-control (both objective and subjective assessment), few other risk factors, and identifiable protective  factors, including available and accessible social support.  Follow-up Information    Follow up with [facility] On 09/17/2015.   Specialty:  Behavioral Health   Why:   Medication management appointment with Alemu Mengistu on Thursday March 23rd at 1:40pm. Call office if you need to reschedule appointment. Please ask about therapy services during visit.    Contact information:   [place] [address] [phone]       Plan Of Care/Follow-up recommendations:  Activity:  as tolerated  Diet:  heart healthy Tests:  NA Other:  see below  Patient is leaving unit in good spirits Plans to return to live with her friend Plans to follow up as above- also plans to follow up with her PCP for medical issues as needed Neita Garnet, MD 09/16/2015, 12:43 PM

## 2015-09-16 NOTE — Progress Notes (Signed)
D: Pt has anxious affect and mood.  Pt was in bed in her room upon initial approach.  She reports her goal today was "just trying to adjust to go home tomorrow."  When asked if she feels safe to discharge tomorrow, pt states "I'll be okay."  Pt denies SI/HI, denies hallucinations, denies pain.  A: Met with pt and offered support and encouragement.  Medications offered per order.  Actively listened to pt.  PRN medication administered for anxiety. R: Pt refused Voltaren gel, reporting she does not have pain at time of medication administration.  Pt has otherwise been compliant with medications.  Pt verbally contracts for safety.  Will continue to monitor and assess.

## 2015-09-16 NOTE — Progress Notes (Signed)
Patient ID: [name] White, female   DOB: 1955/06/28, 60 y.o.   MRN: [id number]  Pt. Denies SI/HI and A/V hallucinations. Belongings returned to patient at time of discharge. Patient denies any new onset of pain or discomfort. Discharge instructions and medications were reviewed with patient. Patient verbalized understanding of both medications and discharge instructions. Patient discharged to lobby where a [facility] representative picked her up. Q15 minute safety checks maintained until discharge. No distress upon discharge.

## 2015-09-16 NOTE — Discharge Summary (Signed)
Physician Discharge Summary Note  Patient:  [name] White is an 60 y.o., female MRN:  [id number] DOB:  February 16, 1956 Patient phone:  [phone] (home)  Patient address:   [id number],  Total Time spent with patient: 30 minutes  Date of Admission:  09/07/2015 Date of Discharge:  09/16/2015  Reason for Admission:  Worsening depression and anxiety  Principal Problem: MDD (major depressive disorder), recurrent episode, severe [facility]) Discharge Diagnoses: Patient Active Problem List   Diagnosis Date Noted  . MDD (major depressive disorder), recurrent episode, severe ([place]) [F33.2] 09/07/2015    Priority: High  . Severe episode of recurrent major depressive disorder, without psychotic features ([place]) [F33.2]   . Atrial flutter ([place]) [I48.92] 07/29/2015  . History of atrial flutter [Z86.79] 06/26/2015  . Chest pain [R07.9] 03/22/2015  . SOB (shortness of breath) [R06.02]   . MDD (major depressive disorder), recurrent severe, without psychosis ([place]) [F33.2] 03/05/2014  . Pulmonary embolism ([place]) [I26.99] 12/27/2013  . Vocal cord dysfunction [J38.3] 11/12/2013  . Mixed headache [R51] 01/06/2012  . OVERACTIVE BLADDER [N31.8] 04/18/2008  . Osteoarthritis of both knees [M17.0] 04/18/2008  . POLYARTHRITIS [M13.0] 09/14/2007  . Obesity [E66.9] 08/24/2006  . RESTLESS LEGS SYNDROME [G25.89] 08/24/2006  . HYPERTENSION, BENIGN SYSTEMIC [I10] 08/24/2006  . RHINITIS, ALLERGIC [J30.9] 08/24/2006  . Asthma [J45.909] 08/24/2006  . GASTROESOPHAGEAL REFLUX, NO ESOPHAGITIS [K21.9] 08/24/2006    Past Psychiatric History:  See above noted  Past Medical History:  Past Medical History  Diagnosis Date  . Asthma   . Hypertension   . GERD (gastroesophageal reflux disease)   . Anxiety   . Depression   . Family history of anesthesia complication     "daughter; causes her to pass out afterwards"  . Pulmonary embolism ([place]) 12/28/2013    "2 clots in each lung"  . Hyperthyroidism    . Migraine     "monthly" (12/28/2013)  . Osteoarthritis     "both knees; back of my neck; right pelvic bone" (12/28/2013)  . Chronic lower back pain   . Paroxysmal a-fib [facility])     Past Surgical History  Procedure Laterality Date  . Abdominal hysterectomy    . Wisdom tooth extraction    . Thyroidectomy, partial Right 2005  . Nasal sinus surgery  2007  . Appendectomy    . Excisional hemorrhoidectomy    . Dilation and curettage of uterus    . Tubal ligation    . Breast cyst excision Right   . Electrophysiologic study N/A 05/27/2015    Procedure: A-Flutter;  Surgeon: Evans Lance, MD;  Location: [facility];  Service: Cardiovascular;  Laterality: N/A;   Family History:  Family History  Problem Relation Age of Onset  . Osteoarthritis Mother   . Asthma Mother   . Heart failure Mother    Family Psychiatric  History:  See above noted Social History:  History  Alcohol Use  . Yes    Comment: "drank some in my 30's"     History  Drug Use No    Social History   Social History  . Marital Status: Single    Spouse Name: N/A  . Number of Children: N/A  . Years of Education: N/A   Social History Main Topics  . Smoking status: Former Smoker -- 0.25 packs/day for 20 years    Types: Cigarettes    Quit date: 07/17/1999  . Smokeless tobacco: Never Used  . Alcohol Use: Yes     Comment: "  drank some in my 30's"  . Drug Use: No  . Sexual Activity: No   Other Topics Concern  . None   Social History Narrative   Hospital Course:  [name] White is a 58 yr ols female with long hx of depression and multiple inpatient admissions, [place] and [facility].  She has a significant history for suicide attempt (overdose in 2010).  Kinda reported that her daughter called the EMS on Sunday, 2 days ago for an emotional breakdown due to depression & anxiety.    [name] White was admitted for MDD (major depressive disorder), recurrent episode, severe ([place]) and crisis management.   She was treated with Zoloft150 mg for depression, ongoing anxiety symptoms, Remeron 15 mg for depression and insomnia, Abilify 5 mg for depression, mood disorder augmentation therapy and Voltaren Gel for knee pain.  Medical problems were identified and treated as needed.  Home medications were restarted as appropriate.  Improvement was monitored by observation and [name] White daily report of symptom reduction.  Emotional and mental status was monitored by daily self inventory reports completed by [name] White and clinical staff.  Patient reported continued improvement, denied any new concerns.  Patient had been compliant on medications and denied side effects.  Support and encouragement was provided.    Patient did well during inpatient stay.  At time of discharge, patient rated both depression and anxiety levels to be manageable and minimal.  Patient was able to identify the triggers of emotional crises and de-stabilizations.  Patient identified the positive things in life that would help in dealing with feelings of loss, depression and unhealthy or abusive tendencies.         [name] White was evaluated by the treatment team for stability and plans for continued recovery upon discharge.  She was offered further treatment options upon discharge including Residential, Intensive Outpatient and Outpatient treatment.  She will follow up with agencies listed below for medication management and counseling.  Encouraged patient to maintain satisfactory support network and home environment.  Advised to adhere to medication compliance and outpatient treatment follow up.      [name] White motivation was an integral factor for scheduling further treatment.  Employment, transportation, bed availability, health status, family support, and any pending legal issues were also considered during her hospital stay.  Upon completion of this admission the patient was both mentally and medically stable for  discharge denying suicidal/homicidal ideation, auditory/visual/tactile hallucinations, delusional thoughts and paranoia.      Physical Findings: AIMS: Facial and Oral Movements Muscles of Facial Expression: None, normal Lips and Perioral Area: None, normal Jaw: None, normal Tongue: None, normal,Extremity Movements Upper (arms, wrists, hands, fingers): None, normal Lower (legs, knees, ankles, toes): None, normal, Trunk Movements Neck, shoulders, hips: None, normal, Overall Severity Severity of abnormal movements (highest score from questions above): None, normal Incapacitation due to abnormal movements: None, normal Patient's awareness of abnormal movements (rate only patient's report): No Awareness, Dental Status Current problems with teeth and/or dentures?: No Does patient usually wear dentures?: No  CIWA:    COWS:     Musculoskeletal: Strength & Muscle Tone: within normal limits Gait & Station: normal Patient leans: N/A  Psychiatric Specialty Exam:  SEE MD SRA Review of Systems  Psychiatric/Behavioral: Negative for depression, suicidal ideas and hallucinations.  All other systems reviewed and are negative.   Blood pressure 126/75, pulse 98, temperature 98.1 F (36.7 C), temperature source Oral, resp. rate 16, height 5' 5.5" (1.664 m),  weight 104.327 kg (230 lb).Body mass index is 37.68 kg/(m^2).  Have you used any form of tobacco in the last 30 days? (Cigarettes, Smokeless Tobacco, Cigars, and/or Pipes): No  Has this patient used any form of tobacco in the last 30 days? (Cigarettes, Smokeless Tobacco, Cigars, and/or Pipes) Yes, N/A  Blood Alcohol level:  Lab Results  Component Value Date   [facility] <5 09/06/2015   ETH <11 [id number]    Metabolic Disorder Labs:  Lab Results  Component Value Date   HGBA1C 5.9* 09/08/2015   MPG 123 09/08/2015   MPG 131 03/22/2015   Lab Results  Component Value Date   PROLACTIN 10.8 09/08/2015   Lab Results  Component Value Date   CHOL  205* 09/08/2015   TRIG 81 09/08/2015   HDL 87 09/08/2015   CHOLHDL 2.4 09/08/2015   VLDL 16 09/08/2015   LDLCALC 102* 09/08/2015   LDLCALC 91 03/23/2015    See Psychiatric Specialty Exam and Suicide Risk Assessment completed by Attending Physician prior to discharge.  Discharge destination:  Home  Is patient on multiple antipsychotic therapies at discharge:  No   Has Patient had three or more failed trials of antipsychotic monotherapy by history:  No  Recommended Plan for Multiple Antipsychotic Therapies: NA     Medication List    STOP taking these medications        albuterol (2.5 MG/3ML) 0.083% nebulizer solution  Commonly known as:  PROVENTIL     albuterol 108 (90 Base) MCG/ACT inhaler  Commonly known as:  PROVENTIL HFA;VENTOLIN HFA     budesonide-formoterol 160-4.5 MCG/ACT inhaler  Commonly known as:  SYMBICORT     DEXILANT 60 MG capsule  Generic drug:  dexlansoprazole     senna-docusate 8.6-50 MG tablet  Commonly known as:  Senokot-S     traZODone 100 MG tablet  Commonly known as:  DESYREL      TAKE these medications      Indication   amoxicillin 500 MG capsule  Commonly known as:  AMOXIL  Take 1 capsule (500 mg total) by mouth every 12 (twelve) hours.   Indication:  Upper Respiratory Tract Infection     ARIPiprazole 5 MG tablet  Commonly known as:  ABILIFY  Take 1 tablet (5 mg total) by mouth at bedtime.   Indication:  Major Depressive Disorder     diltiazem 120 MG 24 hr capsule  Commonly known as:  CARDIZEM CD  Take 1 capsule (120 mg total) by mouth daily.   Indication:  High Blood Pressure     flecainide 100 MG tablet  Commonly known as:  TAMBOCOR  Take 1 tablet (100 mg total) by mouth every 12 (twelve) hours.   Indication:  Atrial Fibrillation     mirtazapine 15 MG tablet  Commonly known as:  REMERON  Take 1 tablet (15 mg total) by mouth at bedtime.   Indication:  Trouble Sleeping, Major Depressive Disorder     potassium chloride SA 20  MEQ tablet  Commonly known as:  K-DUR,KLOR-CON  Take 2 tablets (40 mEq total) by mouth daily.   Indication:  Low Amount of Potassium in the Blood     rivaroxaban 20 MG Tabs tablet  Commonly known as:  XARELTO  Take 1 tablet (20 mg total) by mouth daily after supper.   Indication:  Blood Clot in a Blood Vessel of the Lung     sertraline 50 MG tablet  Commonly known as:  ZOLOFT  Take 3 tablets (150 mg  total) by mouth daily.   Indication:  Major Depressive Disorder           Follow-up Information    Follow up with [facility] On 09/17/2015.   Specialty:  Behavioral Health   Why:  Medication management appointment with Alemu Mengistu on Thursday March 23rd at 1:40pm. Call office if you need to reschedule appointment. Please ask about therapy services during visit.    Contact information:   [address] [address] [phone]       Follow-up recommendations:  Activity:  as tol Diet:  as tol  Comments:  1.  Take all your medications as prescribed.   2.  Report any adverse side effects to outpatient provider. 3.  Patient instructed to not use alcohol or illegal drugs while on prescription medicines. 4.  In the event of worsening symptoms, instructed patient to call 911, the crisis hotline or go to nearest emergency room for evaluation of symptoms.  Signed: Janett Labella, NP [facility] 09/16/2015, 12:40 PM   Patient seen, Suicide Assessment Completed.  Disposition Plan Reviewed

## 2015-10-21 ENCOUNTER — Other Ambulatory Visit: Payer: Self-pay | Admitting: *Deleted

## 2015-10-21 NOTE — Telephone Encounter (Signed)
Medication   XARELTO 20 MG TABS tablet [[id number]]       XARELTO 20 MG TABS tablet [id number] DISCONTINUED     Order Details    Dose: 20 mg Route: Oral Frequency: Daily after supper   Dispense Quantity:  -- Refills:  1 Fills Remaining:  1          Sig: Take 20 mg by mouth daily after supper.         Discontinue Date:  09/16/2015 1519 Discontinue User:  Kerrie Buffalo, NP Discontinue Reason:  Stop Taking at Discharge   Written Date:  -- Expiration Date:  -- Ordering Date:  03/22/15    Start Date:  02/28/15 End Date:  09/16/15     Ordering Provider:  -- Authorizing Provider:  Historical Provider, MD Ordering User:  Cleophus Molt Prayer, [employer]          Medication Notes      Cleophus Molt Prayer, [place] -- 03/22/15 11:52 AM      >> Iven Finn Mar 22, 2015 11:52 AM      ..Marland Kitchen      Cleophus Molt Prayer, [employer] -- 03/22/15 11:52 AM      >> Iven Finn Mar 22, 2015 11:52 AM      Received from: External Pharmacy Received Sig:

## 2015-10-22 ENCOUNTER — Telehealth: Payer: Self-pay | Admitting: *Deleted

## 2015-10-22 ENCOUNTER — Other Ambulatory Visit: Payer: Self-pay | Admitting: *Deleted

## 2015-10-22 MED ORDER — RIVAROXABAN 20 MG PO TABS: 20.0000 mg | ORAL_TABLET | Freq: Every day | ORAL | Status: DC

## 2015-10-22 NOTE — Telephone Encounter (Signed)
Patient requested a refill on xarelto. See refill encounter from yesterday. Just want to clarify that she should still be taking. Please advise. Thanks, MI

## 2015-10-22 NOTE — Telephone Encounter (Signed)
Ok to fill On discharge summary from [facility] admission

## 2016-03-17 IMAGING — CT CT ANGIO CHEST
2 of 8 series · 19 of 46 positions shown · IV contrast (Omni 300)
Comparison: 12/27/2013

CLINICAL DATA: Shortness of breath, tachycardia.

EXAM:
CT ANGIOGRAPHY CHEST WITH CONTRAST
TECHNIQUE: Multidetector CT imaging of the chest was performed using the
standard protocol during bolus administration of intravenous
contrast. Multiplanar CT image reconstructions and MIPs were
obtained to evaluate the vascular anatomy.
CONTRAST:  80mL OMNIPAQUE IOHEXOL 350 MG/ML SOLN

[Series 5: thins · axial · 0.63mm/px · z∈[+1020,+1282]mm · 16 of 290 slices shown]
[im 14/290  lung]
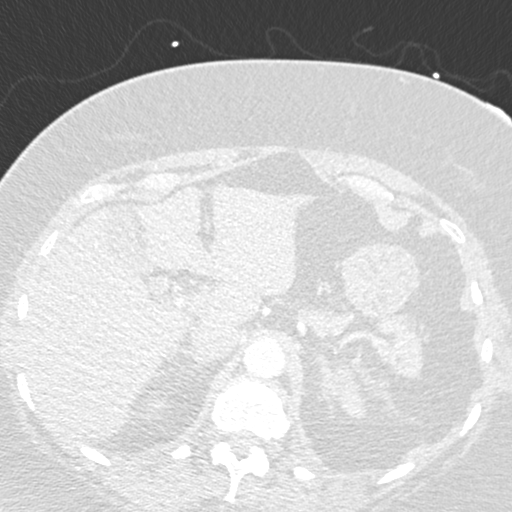
[im 27/290  soft-tissue]
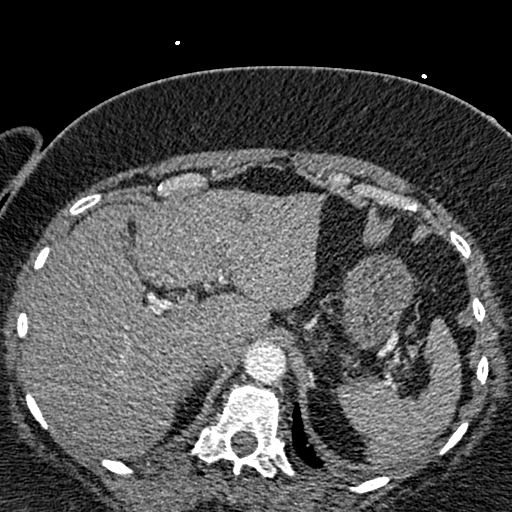
[im 53/290  lung]
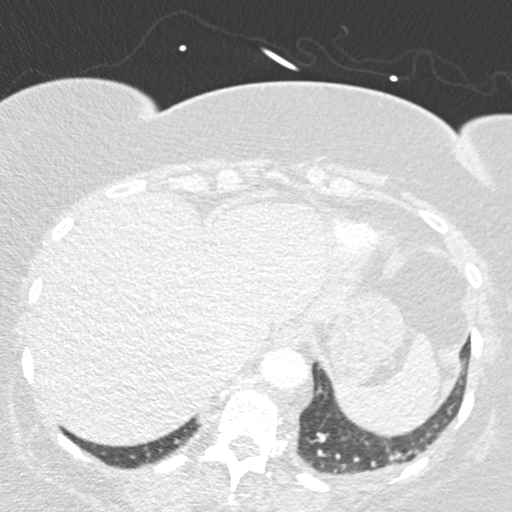
[im 66/290  soft-tissue]
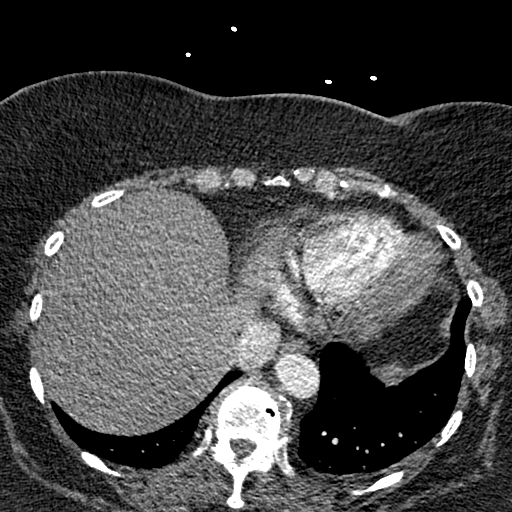
[im 79/290  lung]
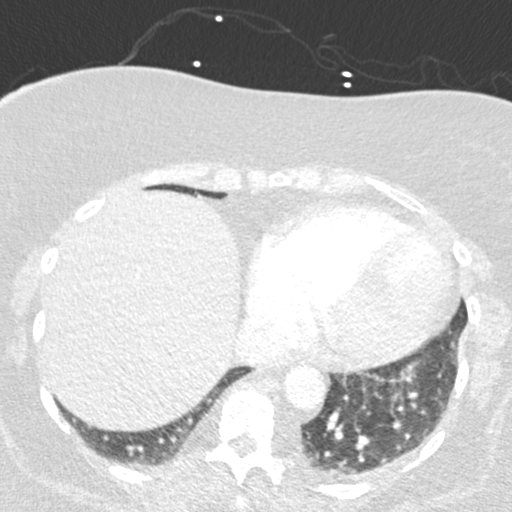
[im 106/290  soft-tissue]
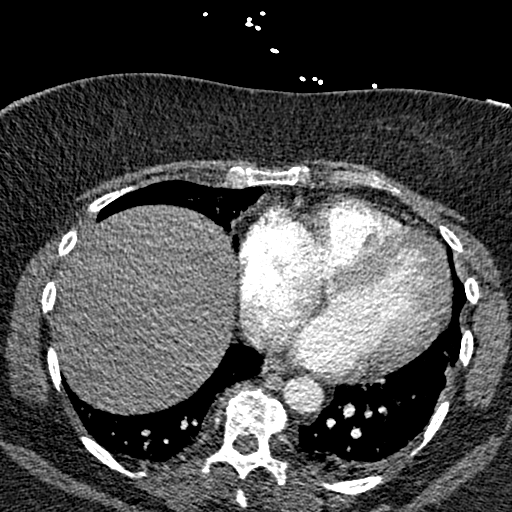
[im 119/290  lung]
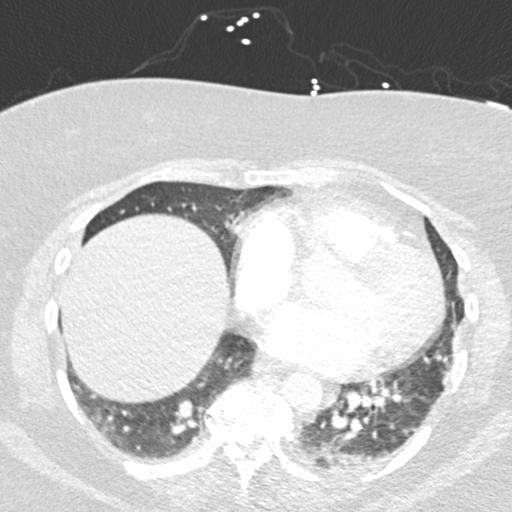
[im 132/290  soft-tissue]
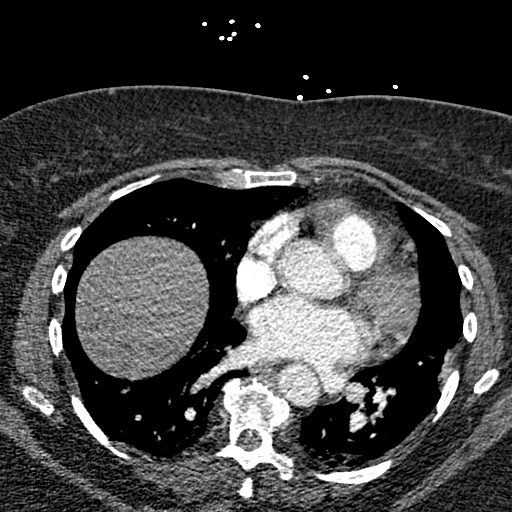
[im 158/290  lung]
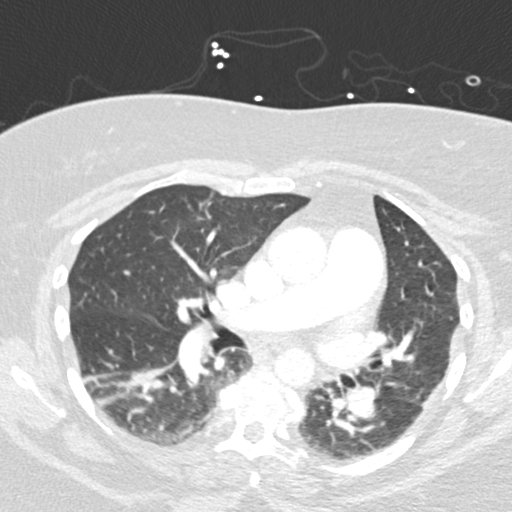
[im 171/290  soft-tissue]
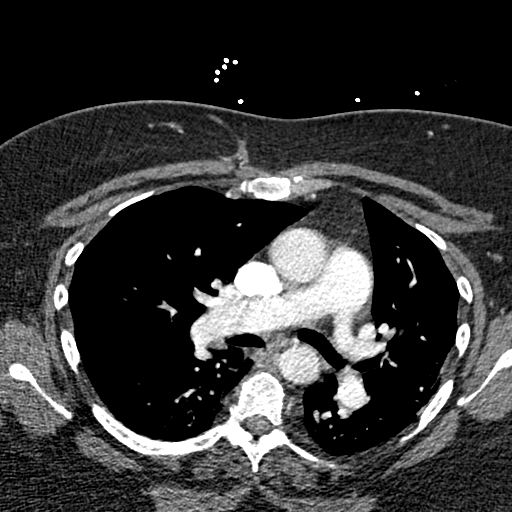
[im 184/290  lung]
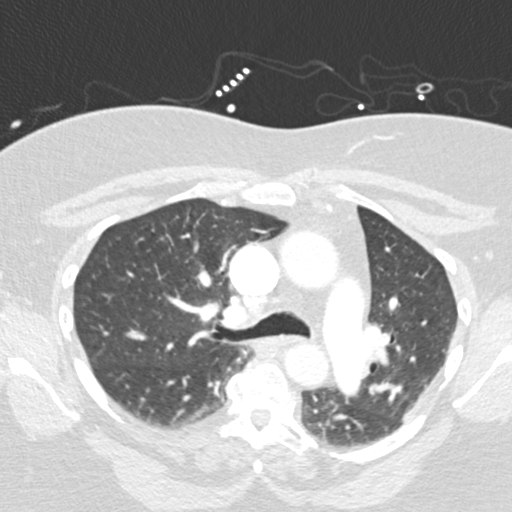
[im 211/290  soft-tissue]
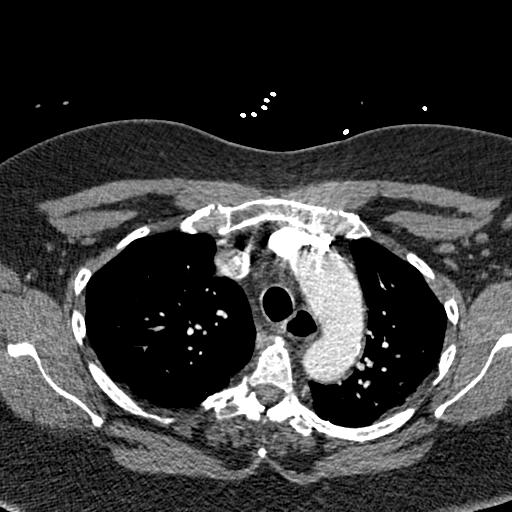
[im 224/290  lung]
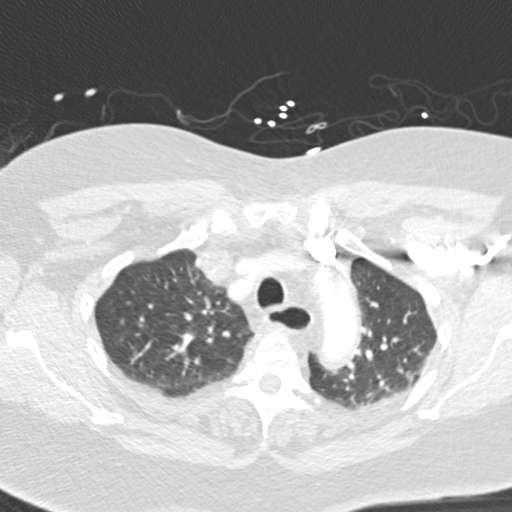
[im 237/290  soft-tissue]
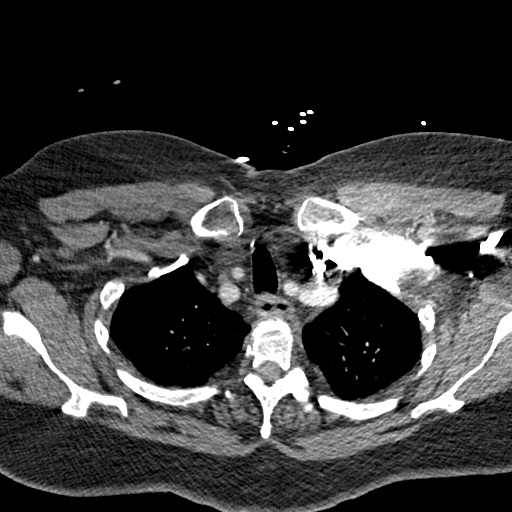
[im 263/290  lung]
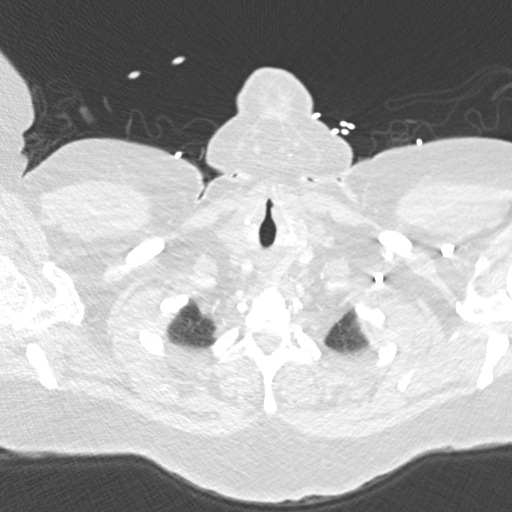
[im 276/290  soft-tissue]
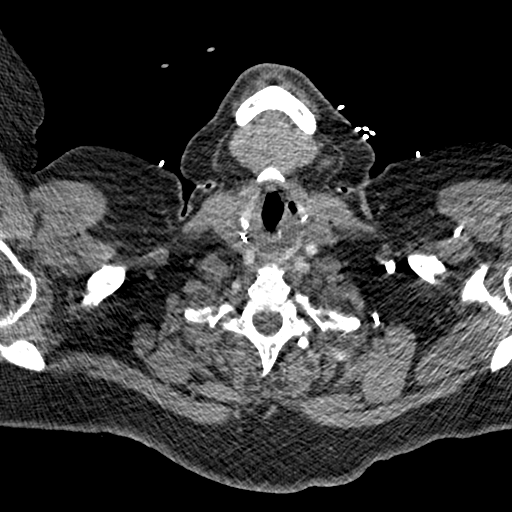

[Series 7: coronal mpr · coronal · 0.62mm/px · 3 of 101 slices shown]
[im 26/101  soft-tissue]
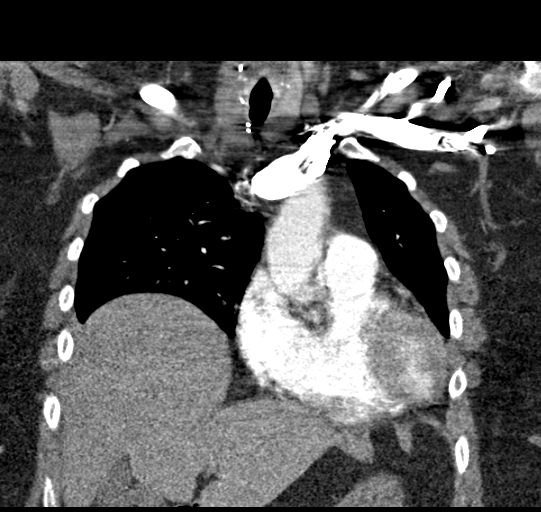
[im 51/101  soft-tissue]
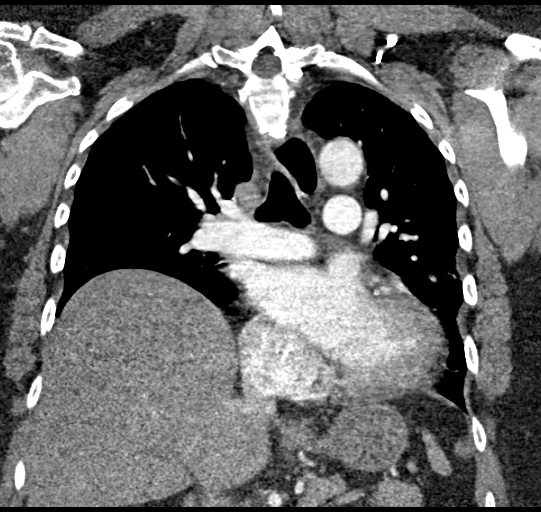
[im 76/101  soft-tissue]
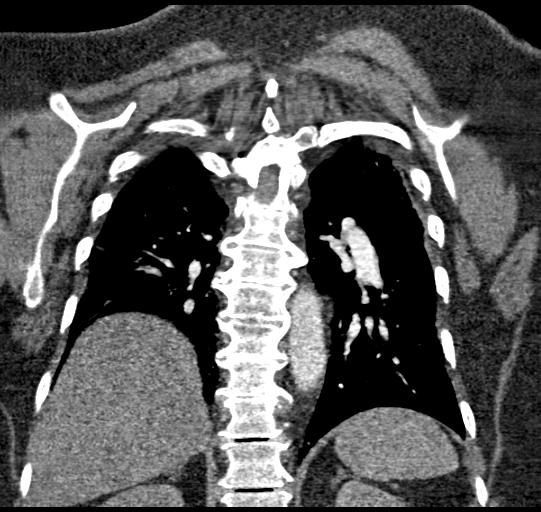

[19 of 46 positions shown; findings below may reference images not displayed]

FINDINGS: Previously seen left upper lobe and right lower lobe pulmonary
emboli have resolved. No acute pulmonary embolus. Heart is normal
size. Aorta is normal caliber. No mediastinal, hilar, or axillary
adenopathy. Chest wall soft tissues are unremarkable.

Predominately linear densities noted in the lower lobes bilaterally
most compatible with atelectasis. No pleural effusions.

Imaging into the upper abdomen shows no acute findings.

Review of the MIP images confirms the above findings.
IMPRESSION: No evidence of pulmonary embolus.

Bilateral lower lobe atelectasis.

## 2016-07-29 ENCOUNTER — Encounter: Payer: Self-pay | Admitting: Internal Medicine

## 2016-07-29 ENCOUNTER — Telehealth: Payer: Self-pay

## 2016-07-29 ENCOUNTER — Ambulatory Visit (INDEPENDENT_AMBULATORY_CARE_PROVIDER_SITE_OTHER): Payer: Medicare PPO | Admitting: Internal Medicine

## 2016-07-29 DIAGNOSIS — I484 Atypical atrial flutter: Secondary | ICD-10-CM

## 2016-07-29 DIAGNOSIS — R002 Palpitations: Secondary | ICD-10-CM

## 2016-07-29 DIAGNOSIS — I471 Supraventricular tachycardia: Secondary | ICD-10-CM

## 2016-07-29 MED ORDER — FLECAINIDE ACETATE 100 MG PO TABS: 100.0000 mg | ORAL_TABLET | Freq: Two times a day (BID) | ORAL | 3 refills | Status: DC

## 2016-07-29 MED ORDER — RIVAROXABAN 20 MG PO TABS: 20.0000 mg | ORAL_TABLET | Freq: Every day | ORAL | 3 refills | Status: DC

## 2016-07-29 MED ORDER — POTASSIUM CHLORIDE CRYS ER 20 MEQ PO TBCR: 40.0000 meq | EXTENDED_RELEASE_TABLET | Freq: Every day | ORAL | 3 refills | Status: DC

## 2016-07-29 MED ORDER — DILTIAZEM HCL ER COATED BEADS 120 MG PO CP24: 120.0000 mg | ORAL_CAPSULE | Freq: Every day | ORAL | 3 refills | Status: DC

## 2016-07-29 NOTE — Telephone Encounter (Signed)
Reordered potassium to send electronic Rx (printed by mistake).

## 2016-07-29 NOTE — Addendum Note (Signed)
Addended by: Briant Cedar R on: 07/29/2016 03:35 PM   Modules accepted: Orders

## 2016-07-29 NOTE — Addendum Note (Signed)
Addended by: Briant Cedar R on: 07/29/2016 04:17 PM   Modules accepted: Orders

## 2016-07-29 NOTE — Progress Notes (Signed)
HPI [name] White returns today for followup. She is a very pleasant 61 year old woman with a history of recurrent SVT, hypertension, and COPD. She continues to have palpitations. She returns today for followup. She has lost over 70 lbs but has gained back 25. No chest pain. She has taken flecainide for over a year but has noted break thru palpitations over the past few months, typically lasting 5 minutes or so and awakening her from sleep. She thinks that her heart is beating too fast. No syncope.  Allergies  Allergen Reactions  . Caffeine Other (See Comments)    Migraine  . Vicodin [Hydrocodone-Acetaminophen] Nausea And Vomiting and Other (See Comments)    Headache   . Ciprofloxacin Hives  . Erythromycin Hives  . Lisinopril Cough  . Oxycodone Nausea And Vomiting    headache  . Sulfamethoxazole-Trimethoprim Rash     Current Outpatient Prescriptions  Medication Sig Dispense Refill  . ARIPiprazole (ABILIFY) 5 MG tablet Take 1 tablet (5 mg total) by mouth at bedtime. 30 tablet 0  . diltiazem (CARDIZEM CD) 120 MG 24 hr capsule Take 1 capsule (120 mg total) by mouth daily. 30 capsule 0  . flecainide (TAMBOCOR) 100 MG tablet Take 1 tablet (100 mg total) by mouth every 12 (twelve) hours. 180 tablet 3  . mirtazapine (REMERON) 15 MG tablet Take 1 tablet (15 mg total) by mouth at bedtime. 30 tablet 0  . potassium chloride SA (K-DUR,KLOR-CON) 20 MEQ tablet Take 2 tablets (40 mEq total) by mouth daily. 60 tablet 0  . rivaroxaban (XARELTO) 20 MG TABS tablet Take 1 tablet (20 mg total) by mouth daily after supper. 30 tablet 9  . sertraline (ZOLOFT) 50 MG tablet Take 3 tablets (150 mg total) by mouth daily. 90 tablet 0   No current facility-administered medications for this visit.      Past Medical History:  Diagnosis Date  . Anxiety   . Asthma   . Chronic lower back pain   . Depression   . Family history of anesthesia complication    "daughter; causes her to pass out afterwards"    . GERD (gastroesophageal reflux disease)   . Hypertension   . Hyperthyroidism   . Migraine    "monthly" (12/28/2013)  . Osteoarthritis    "both knees; back of my neck; right pelvic bone" (12/28/2013)  . Paroxysmal a-fib ([place])   . Pulmonary embolism ([place]) 12/28/2013   "2 clots in each lung"    ROS:   All systems reviewed and negative except as noted in the HPI.   Past Surgical History:  Procedure Laterality Date  . ABDOMINAL HYSTERECTOMY    . APPENDECTOMY    . BREAST CYST EXCISION Right   . DILATION AND CURETTAGE OF UTERUS    . ELECTROPHYSIOLOGIC STUDY N/A 05/27/2015   Procedure: A-Flutter;  Surgeon: Evans Lance, MD;  Location: [facility];  Service: Cardiovascular;  Laterality: N/A;  . EXCISIONAL HEMORRHOIDECTOMY    . NASAL SINUS SURGERY  2007  . THYROIDECTOMY, PARTIAL Right 2005  . TUBAL LIGATION    . WISDOM TOOTH EXTRACTION       Family History  Problem Relation Age of Onset  . Osteoarthritis Mother   . Asthma Mother   . Heart failure Mother      Social History   Social History  . Marital status: Single    Spouse name: N/A  . Number of children: N/A  . Years of education: N/A   Occupational  History  . Not on file.   Social History Main Topics  . Smoking status: Former Smoker    Packs/day: 0.25    Years: 20.00    Types: Cigarettes    Quit date: 07/17/1999  . Smokeless tobacco: Never Used  . Alcohol use Yes     Comment: "drank some in my 30's"  . Drug use: No  . Sexual activity: No   Other Topics Concern  . Not on file   Social History Narrative  . No narrative on file    BP - 128/62, P - 77, R - 16, Wt. - 253  Physical Exam:  Well appearing 61 yo woman, NAD HEENT: Unremarkable Neck:  7 cm JVD, no thyromegally Back:  No CVA tenderness Lungs:  Clear with no wheezes HEART:  Regular rate rhythm, no murmurs, no rubs, no clicks Abd:  soft, positive bowel sounds, no organomegally, no rebound, no guarding Ext:  2 plus pulses, no edema,  no cyanosis, no clubbing Skin:  No rashes no nodules Neuro:  CN II through XII intact, motor grossly intact  ECG - nsr  Assess/Plan: 1. Palpitations - the patient has had recurrent symptoms despite flecainide. I have asked that the patient to wear a cardiac monitor for 3 weeks. We will see her back after this. 2. Obesity - she has gained back 25 lbs since her last visit. I have asked that she work on trying to lose weight. 3. HTN - her blood pressure is controlled. Will follow. 4. Atypical atrial flutter - unclear if her current symptoms are related to this or not. She will continue her current meds.  [name].D.

## 2016-07-29 NOTE — Patient Instructions (Addendum)
Medication Instructions:  Your physician recommends that you continue on your current medications as directed. Please refer to the Current Medication list given to you today.   Labwork: None Ordered   Testing/Procedures: Your physician has recommended that you wear an event monitor for 3 weeks. Event monitors are medical devices that record the heart's electrical activity. Doctors most often [place] these monitors to diagnose arrhythmias. Arrhythmias are problems with the speed or rhythm of the heartbeat. The monitor is a small, portable device. You can wear one while you do your normal daily activities. This is usually used to diagnose what is causing palpitations/syncope (passing out).    Follow-Up: Your physician recommends that you schedule a follow-up appointment in: 6 weeks with Dr. Lovena Le  Any Other Special Instructions Will Be Listed Below (If Applicable).     If you need a refill on your cardiac medications before your next appointment, please call your pharmacy.

## 2016-08-02 NOTE — Addendum Note (Signed)
Addended by: Campbell Riches on: 08/02/2016 07:23 AM   Modules accepted: Orders

## 2016-08-10 ENCOUNTER — Ambulatory Visit (INDEPENDENT_AMBULATORY_CARE_PROVIDER_SITE_OTHER): Payer: Medicare PPO

## 2016-08-10 DIAGNOSIS — R002 Palpitations: Secondary | ICD-10-CM | POA: Diagnosis not present

## 2016-08-10 DIAGNOSIS — I484 Atypical atrial flutter: Secondary | ICD-10-CM

## 2016-09-29 ENCOUNTER — Ambulatory Visit: Payer: Medicare PPO | Admitting: Internal Medicine

## 2016-10-03 ENCOUNTER — Ambulatory Visit (INDEPENDENT_AMBULATORY_CARE_PROVIDER_SITE_OTHER): Payer: Medicare PPO | Admitting: Internal Medicine

## 2016-10-03 ENCOUNTER — Encounter: Payer: Self-pay | Admitting: Internal Medicine

## 2016-10-03 VITALS — BP 130/82 | HR 59 | Ht 65.0 in | Wt 261.8 lb

## 2016-10-03 DIAGNOSIS — R002 Palpitations: Secondary | ICD-10-CM | POA: Diagnosis not present

## 2016-10-03 NOTE — Progress Notes (Signed)
HPI [name] White returns today for followup. She is a very pleasant 61 year old woman with a history of recurrent SVT, obesity, hypertension, and COPD.  No syncope. She has severe depression and anxiety. When I saw her 2 months ago, we had her wear a cardiac monitor because of palpitations. This showed PVC's, PAC's, and sinus rhythm. No sustained atrial or ventricular arrhythmias were noted. Allergies  Allergen Reactions  . Caffeine Other (See Comments)    Migraine  . Vicodin [Hydrocodone-Acetaminophen] Nausea And Vomiting and Other (See Comments)    Headache   . Ciprofloxacin Hives  . Erythromycin Hives  . Lisinopril Cough  . Oxycodone Nausea And Vomiting    headache  . Sulfamethoxazole-Trimethoprim Rash     Current Outpatient Prescriptions  Medication Sig Dispense Refill  . diltiazem (CARDIZEM CD) 120 MG 24 hr capsule Take 1 capsule (120 mg total) by mouth daily. 90 capsule 3  . flecainide (TAMBOCOR) 100 MG tablet Take 1 tablet (100 mg total) by mouth every 12 (twelve) hours. 180 tablet 3  . FLUoxetine (PROZAC) 40 MG capsule Take 40 mg by mouth daily.    . potassium chloride SA (K-DUR,KLOR-CON) 20 MEQ tablet Take 2 tablets (40 mEq total) by mouth daily. 180 tablet 3  . rivaroxaban (XARELTO) 20 MG TABS tablet Take 1 tablet (20 mg total) by mouth daily after supper. 90 tablet 3  . traZODone (DESYREL) 50 MG tablet Take 50 mg by mouth at bedtime.     No current facility-administered medications for this visit.      Past Medical History:  Diagnosis Date  . Anxiety   . Asthma   . Chronic lower back pain   . Depression   . Family history of anesthesia complication    "daughter; causes her to pass out afterwards"  . GERD (gastroesophageal reflux disease)   . Hypertension   . Hyperthyroidism   . Migraine    "monthly" (12/28/2013)  . Osteoarthritis    "both knees; back of my neck; right pelvic bone" (12/28/2013)  . Paroxysmal a-fib ([place])   . Pulmonary embolism ([place])  12/28/2013   "2 clots in each lung"    ROS:   All systems reviewed and negative except as noted in the HPI.   Past Surgical History:  Procedure Laterality Date  . ABDOMINAL HYSTERECTOMY    . APPENDECTOMY    . BREAST CYST EXCISION Right   . DILATION AND CURETTAGE OF UTERUS    . ELECTROPHYSIOLOGIC STUDY N/A 05/27/2015   Procedure: A-Flutter;  Surgeon: Evans Lance, MD;  Location: [facility];  Service: Cardiovascular;  Laterality: N/A;  . EXCISIONAL HEMORRHOIDECTOMY    . NASAL SINUS SURGERY  2007  . THYROIDECTOMY, PARTIAL Right 2005  . TUBAL LIGATION    . WISDOM TOOTH EXTRACTION       Family History  Problem Relation Age of Onset  . Osteoarthritis Mother   . Asthma Mother   . Heart failure Mother      Social History   Social History  . Marital status: Single    Spouse name: N/A  . Number of children: N/A  . Years of education: N/A   Occupational History  . Not on file.   Social History Main Topics  . Smoking status: Former Smoker    Packs/day: 0.25    Years: 20.00    Types: Cigarettes    Quit date: 07/17/1999  . Smokeless tobacco: Never Used  . Alcohol use Yes  Comment: "drank some in my 30's"  . Drug use: No  . Sexual activity: No   Other Topics Concern  . Not on file   Social History Narrative  . No narrative on file   BP - 130/82, p-59, r- 16   Physical Exam:  Well appearing 61 yo woman, NAD HEENT: Unremarkable Neck:  7 cm JVD, no thyromegally Back:  No CVA tenderness Lungs:  Clear with no wheezes HEART:  Regular rate rhythm, no murmurs, no rubs, no clicks Abd:  soft, positive bowel sounds, no organomegally, no rebound, no guarding Ext:  2 plus pulses, no edema, no cyanosis, no clubbing Skin:  No rashes no nodules Neuro:  CN II through XII intact, motor grossly intact  ECG - nsr  Assess/Plan: 1. Palpitations - the patient has had recurrent symptoms despite flecainide. I have asked that the patient to wear a cardiac monitor for  3 weeks. We will see her back after this. 2. Obesity - she has not lost any weight and I have encouraged her to do so. I have asked that she work on trying to lose weight. 3. HTN - her blood pressure is controlled. Will follow. 4. Atypical atrial flutter - unclear if her current symptoms are related to this or not. She will continue her current meds.  [name].D.

## 2016-10-03 NOTE — Patient Instructions (Signed)
Medication Instructions:  Your physician recommends that you continue on your current medications as directed. Please refer to the Current Medication list given to you today.   Labwork: None Ordered   Testing/Procedures: None Ordered   Follow-Up: Your physician wants you to follow-up in: 6 months with Dr. Lovena Le.  You will receive a reminder letter in the mail two months in advance. If you don't receive a letter, please call our office to schedule the follow-up appointment.   If you need a refill on your cardiac medications before your next appointment, please call your pharmacy.   Thank you for choosing [facility]! Christen Bame, RN [phone]

## 2017-02-04 ENCOUNTER — Emergency Department (HOSPITAL_COMMUNITY)
Admission: EM | Admit: 2017-02-04 | Discharge: 2017-02-04 | Disposition: A | Discharge status: Home / Self Care | Payer: Medicare PPO | Attending: Emergency Medicine | Admitting: Emergency Medicine

## 2017-02-04 ENCOUNTER — Encounter (HOSPITAL_COMMUNITY): Payer: Self-pay

## 2017-02-04 DIAGNOSIS — I480 Paroxysmal atrial fibrillation: Secondary | ICD-10-CM | POA: Diagnosis not present

## 2017-02-04 DIAGNOSIS — I10 Essential (primary) hypertension: Secondary | ICD-10-CM | POA: Diagnosis present

## 2017-02-04 DIAGNOSIS — J45909 Unspecified asthma, uncomplicated: Secondary | ICD-10-CM | POA: Diagnosis not present

## 2017-02-04 DIAGNOSIS — Z79899 Other long term (current) drug therapy: Secondary | ICD-10-CM | POA: Insufficient documentation

## 2017-02-04 DIAGNOSIS — Z7901 Long term (current) use of anticoagulants: Secondary | ICD-10-CM | POA: Insufficient documentation

## 2017-02-04 DIAGNOSIS — Z87891 Personal history of nicotine dependence: Secondary | ICD-10-CM | POA: Insufficient documentation

## 2017-02-04 LAB — CBC WITH DIFFERENTIAL/PLATELET
BASOS PCT: 0 %
Basophils Absolute: 0 10*3/uL (ref 0.0–0.1)
EOS ABS: 0.2 10*3/uL (ref 0.0–0.7)
EOS PCT: 2 %
HCT: 42.5 % (ref 36.0–46.0)
HEMOGLOBIN: 14.3 g/dL (ref 12.0–15.0)
Lymphocytes Relative: 28 %
Lymphs Abs: 2.4 10*3/uL (ref 0.7–4.0)
MCH: 31.6 pg (ref 26.0–34.0)
MCHC: 33.6 g/dL (ref 30.0–36.0)
MCV: 93.8 fL (ref 78.0–100.0)
MONOS PCT: 10 %
Monocytes Absolute: 0.9 10*3/uL (ref 0.1–1.0)
NEUTROS PCT: 60 %
Neutro Abs: 4.9 10*3/uL (ref 1.7–7.7)
PLATELETS: 359 10*3/uL (ref 150–400)
RBC: 4.53 MIL/uL (ref 3.87–5.11)
RDW: 14.8 % (ref 11.5–15.5)
WBC: 8.4 10*3/uL (ref 4.0–10.5)

## 2017-02-04 LAB — COMPREHENSIVE METABOLIC PANEL
ALT: 11 U/L — ABNORMAL LOW (ref 14–54)
ANION GAP: 9 (ref 5–15)
AST: 21 U/L (ref 15–41)
Albumin: 3.4 g/dL — ABNORMAL LOW (ref 3.5–5.0)
Alkaline Phosphatase: 84 U/L (ref 38–126)
BILIRUBIN TOTAL: 0.6 mg/dL (ref 0.3–1.2)
BUN: 9 mg/dL (ref 6–20)
CALCIUM: 8.7 mg/dL — AB (ref 8.9–10.3)
CO2: 25 mmol/L (ref 22–32)
CREATININE: 0.74 mg/dL (ref 0.44–1.00)
Chloride: 106 mmol/L (ref 101–111)
GFR calc non Af Amer: >60 mL/min (ref >60)
GLUCOSE: 95 mg/dL (ref 65–99)
Potassium: 3.1 mmol/L — ABNORMAL LOW (ref 3.5–5.1)
Sodium: 140 mmol/L (ref 135–145)
TOTAL PROTEIN: 7.8 g/dL (ref 6.5–8.1)

## 2017-02-04 LAB — ETHANOL: Alcohol, Ethyl (B): <5 mg/dL (ref <5)

## 2017-02-04 LAB — TROPONIN I

## 2017-02-04 NOTE — ED Notes (Signed)
Bed: WLPT3 Expected date:  Expected time:  Means of arrival:  Comments: 

## 2017-02-04 NOTE — ED Triage Notes (Signed)
Sent from [facility] for medical clearance states blood pressure is elevated no SI or HI voiced when asked pain in knees voiced.

## 2017-02-04 NOTE — ED Provider Notes (Signed)
[facility] DEPT Provider Note   CSN: [id number] Arrival date & time: 02/04/17  1812     History   Chief Complaint Chief Complaint  Patient presents with  . Hypertension    HPI [name] White is a 61 y.o. female.  Pt presents to the ED today from [facility] for medical clearance.  The pt said that she went to [facility] for severe depression.  She denies si/hi.  Her BP there was elevated, so she was sent here for eval.  BP now is ok.  She has been compliant with her meds.  She does have a hx of a.fib and is on Xarelto.  She denies cp or sob.  CHA2DS2/VAS Stroke Risk Points      4 >= 2 Points: High Risk  1 - 1.99 Points: Medium Risk  0 Points: Low Risk    The patient's score has not changed in the past year.:  No Change         Details    Note: External data might be a factor in metrics not marked with    Points Metrics   This score determines the patient's risk of having a stroke if the  patient has atrial fibrillation.       0 Has Congestive Heart Failure:  No   0 Has Vascular Disease:  No   1 Has Hypertension:  Yes   0 Age:  8   0 Has Diabetes:  No   2 Had Stroke:  No Had TIA:  No Had thromboembolism:  Yes    1 Female:  Yes             Past Medical History:  Diagnosis Date  . Anxiety   . Asthma   . Chronic lower back pain   . Depression   . Family history of anesthesia complication    "daughter; causes her to pass out afterwards"  . GERD (gastroesophageal reflux disease)   . Hypertension   . Hyperthyroidism   . Migraine    "monthly" (12/28/2013)  . Osteoarthritis    "both knees; back of my neck; right pelvic bone" (12/28/2013)  . Paroxysmal A-fib ([place])   . Pulmonary embolism ([place]) 12/28/2013   "2 clots in each lung"    Patient Active Problem List   Diagnosis Date Noted  . Severe episode of recurrent major depressive disorder, without psychotic features ([place])   . MDD (major depressive disorder), recurrent episode, severe ([place]) 09/07/2015  . Atrial  flutter ([place]) 07/29/2015  . History of atrial flutter 06/26/2015  . Chest pain 03/22/2015  . SOB (shortness of breath)   . MDD (major depressive disorder), recurrent severe, without psychosis ([place]) 03/05/2014  . Pulmonary embolism ([place]) 12/27/2013  . Vocal cord dysfunction 11/12/2013  . Mixed headache 01/06/2012  . OVERACTIVE BLADDER 04/18/2008  . Osteoarthritis of both knees 04/18/2008  . POLYARTHRITIS 09/14/2007  . Obesity 08/24/2006  . RESTLESS LEGS SYNDROME 08/24/2006  . HYPERTENSION, BENIGN SYSTEMIC 08/24/2006  . RHINITIS, ALLERGIC 08/24/2006  . Asthma 08/24/2006  . GASTROESOPHAGEAL REFLUX, NO ESOPHAGITIS 08/24/2006    Past Surgical History:  Procedure Laterality Date  . ABDOMINAL HYSTERECTOMY    . APPENDECTOMY    . BREAST CYST EXCISION Right   . DILATION AND CURETTAGE OF UTERUS    . ELECTROPHYSIOLOGIC STUDY N/A 05/27/2015   Procedure: A-Flutter;  Surgeon: Evans Lance, MD;  Location: [facility];  Service: Cardiovascular;  Laterality: N/A;  . EXCISIONAL HEMORRHOIDECTOMY    . NASAL SINUS SURGERY  2007  . THYROIDECTOMY, PARTIAL Right 2005  . TUBAL LIGATION    . WISDOM TOOTH EXTRACTION      OB History    No data available       Home Medications    Prior to Admission medications   Medication Sig Start Date End Date Taking? Authorizing Provider  diltiazem (CARDIZEM CD) 120 MG 24 hr capsule Take 1 capsule (120 mg total) by mouth daily. 07/29/16   Evans Lance, MD  flecainide (TAMBOCOR) 100 MG tablet Take 1 tablet (100 mg total) by mouth every 12 (twelve) hours. 07/29/16   Evans Lance, MD  FLUoxetine (PROZAC) 40 MG capsule Take 40 mg by mouth daily. 09/27/16   [provider]  potassium chloride SA (K-DUR,KLOR-CON) 20 MEQ tablet Take 2 tablets (40 mEq total) by mouth daily. 07/29/16   Evans Lance, MD  rivaroxaban (XARELTO) 20 MG TABS tablet Take 1 tablet (20 mg total) by mouth daily after supper. 07/29/16   Evans Lance, MD  traZODone (DESYREL) 50  MG tablet Take 50 mg by mouth at bedtime. 09/27/16   [provider]    Family History Family History  Problem Relation Age of Onset  . Osteoarthritis Mother   . Asthma Mother   . Heart failure Mother     Social History Social History  Substance Use Topics  . Smoking status: Former Smoker    Packs/day: 0.25    Years: 20.00    Types: Cigarettes    Quit date: 07/17/1999  . Smokeless tobacco: Never Used  . Alcohol use Yes     Comment: "drank some in my 30's"     Allergies   Caffeine; Vicodin [hydrocodone-acetaminophen]; Ciprofloxacin; Erythromycin; Lisinopril; Oxycodone; and Sulfamethoxazole-trimethoprim   Review of Systems Review of Systems  All other systems reviewed and are negative.    Physical Exam Updated Vital Signs BP (!) 141/86   Pulse (!) 105   Temp 98.2 F (36.8 C) (Oral)   Resp 18   Ht 5\' 5"  (1.651 m)   Wt 117.9 kg (260 lb)   SpO2 96%   BMI 43.27 kg/m   Physical Exam  Constitutional: She is oriented to person, place, and time. She appears well-developed and well-nourished.  HENT:  Head: Normocephalic and atraumatic.  Right Ear: External ear normal.  Left Ear: External ear normal.  Nose: Nose normal.  Mouth/Throat: Oropharynx is clear and moist.  Eyes: Pupils are equal, round, and reactive to light. Conjunctivae and EOM are normal.  Neck: Normal range of motion. Neck supple.  Cardiovascular: Normal rate, normal heart sounds and intact distal pulses.  An irregularly irregular rhythm present.  Pulmonary/Chest: Effort normal and breath sounds normal.  Abdominal: Soft. Bowel sounds are normal.  Musculoskeletal: Normal range of motion.  Neurological: She is alert and oriented to person, place, and time.  Skin: Skin is warm.  Psychiatric: She has a normal mood and affect. Her behavior is normal. Judgment and thought content normal.  Nursing note and vitals reviewed.    ED Treatments / Results  Labs (all labs ordered are listed, but only  abnormal results are displayed) Labs Reviewed  COMPREHENSIVE METABOLIC PANEL - Abnormal; Notable for the following:       Result Value   Potassium 3.1 (*)    Calcium 8.7 (*)    Albumin 3.4 (*)    ALT 11 (*)    All other components within normal limits  ETHANOL  CBC WITH DIFFERENTIAL/PLATELET  TROPONIN I  RAPID  URINE DRUG SCREEN, HOSP PERFORMED  URINALYSIS, ROUTINE W REFLEX MICROSCOPIC    EKG  EKG Interpretation  Date/Time:  Saturday February 04 2017 19:18:30 EDT Ventricular Rate:  132 PR Interval:    QRS Duration: 92 QT Interval:  337 QTC Calculation: 500 R Axis:   17 Text Interpretation:  Sinus or ectopic atrial tachycardia Borderline prolonged QT interval rate is faster, but otherwise unchanged since previous Confirmed by Isla Pence [phone]) on 02/04/2017 8:40:51 PM       Radiology No results found.  Procedures Procedures (including critical care time)  Medications Ordered in ED Medications - No data to display   Initial Impression / Assessment and Plan / ED Course  I have reviewed the triage vital signs and the nursing notes.  Pertinent labs & imaging results that were available during my care of the patient were reviewed by me and considered in my medical decision making (see chart for details).    Pt's bp down to 118 without any intervention.  Her heart rate is under control.  Pt's bp likely elevated due to nervousness.  She is medically cleared for [facility].  She knows to continue her current meds.  Final Clinical Impressions(s) / ED Diagnoses   Final diagnoses:  Essential hypertension  Paroxysmal atrial fibrillation [facility])    New Prescriptions New Prescriptions   No medications on file     Isla Pence, MD 02/04/17 2115

## 2017-02-04 NOTE — ED Notes (Signed)
[facility] was called for pt's transportation back to [facility] [place].

## 2017-02-04 NOTE — ED Notes (Signed)
Bed: WLPT4 Expected date:  Expected time:  Means of arrival:  Comments: 

## 2017-02-04 NOTE — ED Notes (Signed)
[facility] here to transport pt back to [place].

## 2017-02-04 NOTE — ED Notes (Signed)
Mali from [facility] was called and was given report on pt's condition and discharge.

## 2017-02-04 NOTE — ED Notes (Signed)
patient asked to give urine specimen unable to void at present moment.

## 2017-03-09 ENCOUNTER — Telehealth: Payer: Self-pay | Admitting: Internal Medicine

## 2017-03-09 NOTE — Telephone Encounter (Signed)
New Message  Pt c/o medication issue:  1. Name of Medication: Xarelto   2. How are you currently taking this medication (dosage and times per day)? 20mg    3. Are you having a reaction (difficulty breathing--STAT)? Vaginal bleeding  4. What is your medication issue? Please call back to discuss

## 2017-03-10 NOTE — Telephone Encounter (Signed)
Talked to Mrs Perot today. She reports complete resolution of symptoms since last night. Vaginal bleeding was described mainly as "spotting" and not significant bleeding.  She denies symptoms of UTI, increase urinary urgency or pain, denies constipation.  Recommendation:  1. Continue Xarelto without changes  2. Call clinic if symptoms re-occur  3. Visit Urgent Care/Emergency room if bleeding worsen

## 2017-08-24 ENCOUNTER — Telehealth: Payer: Self-pay | Admitting: Internal Medicine

## 2017-08-24 ENCOUNTER — Other Ambulatory Visit: Payer: Self-pay | Admitting: *Deleted

## 2017-08-24 DIAGNOSIS — I471 Supraventricular tachycardia: Secondary | ICD-10-CM

## 2017-08-24 MED ORDER — FLECAINIDE ACETATE 100 MG PO TABS: 100.0000 mg | ORAL_TABLET | Freq: Two times a day (BID) | ORAL | 0 refills | Status: DC

## 2017-08-24 MED ORDER — RIVAROXABAN 20 MG PO TABS: 20.0000 mg | ORAL_TABLET | Freq: Every day | ORAL | 0 refills | Status: DC

## 2017-08-24 NOTE — Telephone Encounter (Signed)
Xarelto 20mg  refill request received; pt is 62 yrs old, wt-118.8kg, Crea-0.74 on 02/04/17, last seen by Dr. Lovena Le on 10/03/16, CrCl-147.84ml/min. Will send to refill to requested pharmacy.

## 2017-08-24 NOTE — Telephone Encounter (Signed)
New message      *STAT* If patient is at the pharmacy, call can be transferred to refill team.   1. Which medications need to be refilled? (please list name of each medication and dose if known) flecainide (TAMBOCOR) 100 MG tablet and rivaroxaban (XARELTO) 20 MG TABS tablet 2. Which pharmacy/location (including street and city if local pharmacy) is medication to be sent to?[facility] [place], [place] AT [address]  3. Do they need a 30 day or 90 day supply? [place]

## 2017-09-19 NOTE — Progress Notes (Deleted)
Electrophysiology Office Note Date: 09/19/2017  ID:  [name] White, DOB [birth year], MRN [id number]  PCP: Mercy Riding, MD Primary Cardiologist: *** Electrophysiologist: ***  CC: ***  [name] White is a 62 y.o. female seen today for ***.  {He/she (caps):30048} presents today for routine electrophysiology followup.  Since last being seen in our clinic, the patient reports doing very well.  {He/she (caps):30048} denies chest pain, palpitations, dyspnea, PND, orthopnea, nausea, vomiting, dizziness, syncope, edema, weight gain, or early satiety.  Past Medical History:  Diagnosis Date  . Anxiety   . Asthma   . Chronic lower back pain   . Depression   . Family history of anesthesia complication    "daughter; causes her to pass out afterwards"  . GERD (gastroesophageal reflux disease)   . Hypertension   . Hyperthyroidism   . Migraine    "monthly" (12/28/2013)  . Osteoarthritis    "both knees; back of my neck; right pelvic bone" (12/28/2013)  . Paroxysmal A-fib ([place])   . Pulmonary embolism ([place]) 12/28/2013   "2 clots in each lung"   Past Surgical History:  Procedure Laterality Date  . ABDOMINAL HYSTERECTOMY    . APPENDECTOMY    . BREAST CYST EXCISION Right   . DILATION AND CURETTAGE OF UTERUS    . ELECTROPHYSIOLOGIC STUDY N/A 05/27/2015   Procedure: A-Flutter;  Surgeon: Evans Lance, MD;  Location: [facility];  Service: Cardiovascular;  Laterality: N/A;  . EXCISIONAL HEMORRHOIDECTOMY    . NASAL SINUS SURGERY  2007  . THYROIDECTOMY, PARTIAL Right 2005  . TUBAL LIGATION    . WISDOM TOOTH EXTRACTION      Current Outpatient Medications  Medication Sig Dispense Refill  . diltiazem (CARDIZEM CD) 120 MG 24 hr capsule Take 1 capsule (120 mg total) by mouth daily. 90 capsule 3  . flecainide (TAMBOCOR) 100 MG tablet Take 1 tablet (100 mg total) by mouth every 12 (twelve) hours. 180 tablet 0  . FLUoxetine (PROZAC) 40 MG capsule Take 40 mg by mouth daily.    .  potassium chloride SA (K-DUR,KLOR-CON) 20 MEQ tablet Take 2 tablets (40 mEq total) by mouth daily. 180 tablet 3  . rivaroxaban (XARELTO) 20 MG TABS tablet Take 1 tablet (20 mg total) by mouth daily after supper. 90 tablet 0  . traZODone (DESYREL) 50 MG tablet Take 50 mg by mouth at bedtime.     No current facility-administered medications for this visit.     Allergies:   Caffeine; Vicodin [hydrocodone-acetaminophen]; Ciprofloxacin; Erythromycin; Lisinopril; Oxycodone; and Sulfamethoxazole-trimethoprim   Social History: Social History   Socioeconomic History  . Marital status: Single    Spouse name: Not on file  . Number of children: Not on file  . Years of education: Not on file  . Highest education level: Not on file  Occupational History  . Not on file  Social Needs  . Financial resource strain: Not on file  . Food insecurity:    Worry: Not on file    Inability: Not on file  . Transportation needs:    Medical: Not on file    Non-medical: Not on file  Tobacco Use  . Smoking status: Former Smoker    Packs/day: 0.25    Years: 20.00    Pack years: 5.00    Types: Cigarettes    Last attempt to quit: 07/17/1999    Years since quitting: 18.1  . Smokeless tobacco: Never Used  Substance and Sexual Activity  .  Alcohol use: Yes    Comment: "drank some in my 30's"  . Drug use: No  . Sexual activity: Never  Lifestyle  . Physical activity:    Days per week: Not on file    Minutes per session: Not on file  . Stress: Not on file  Relationships  . Social connections:    Talks on phone: Not on file    Gets together: Not on file    Attends religious service: Not on file    Active member of club or organization: Not on file    Attends meetings of clubs or organizations: Not on file    Relationship status: Not on file  . Intimate partner violence:    Fear of current or ex partner: Not on file    Emotionally abused: Not on file    Physically abused: Not on file    Forced sexual  activity: Not on file  Other Topics Concern  . Not on file  Social History Narrative  . Not on file    Family History: Family History  Problem Relation Age of Onset  . Osteoarthritis Mother   . Asthma Mother   . Heart failure Mother     Review of Systems: All other systems reviewed and are otherwise negative except as noted above.   Physical Exam: VS:  There were no vitals taken for this visit. , BMI There is no height or weight on file to calculate BMI. Wt Readings from Last 3 Encounters:  02/04/17 260 lb (117.9 kg)  10/03/16 261 lb 12.8 oz (118.8 kg)  09/06/15 231 lb (104.8 kg)    GEN- The patient is well appearing, alert and oriented x 3 today.   HEENT: normocephalic, atraumatic; sclera clear, conjunctiva pink; hearing intact; oropharynx clear; neck supple, no JVP Lymph- no cervical lymphadenopathy Lungs- Clear to ausculation bilaterally, normal work of breathing.  No wheezes, rales, rhonchi Heart- Regular rate and rhythm, no murmurs, rubs or gallops, PMI not laterally displaced GI- soft, non-tender, non-distended, bowel sounds present, no hepatosplenomegaly Extremities- no clubbing, cyanosis, or edema; DP/PT/radial pulses 2+ bilaterally MS- no significant deformity or atrophy Skin- warm and dry, no rash or lesion  Psych- euthymic mood, full affect Neuro- strength and sensation are intact   EKG:  EKG {ACTION; IS/IS SLH:[id number]} ordered today. The ekg ordered today shows ***  Recent Labs: 02/04/2017: ALT 11; BUN 9; Creatinine, Ser 0.74; Hemoglobin 14.3; Platelets 359; Potassium 3.1; Sodium 140    Other studies Reviewed: Additional studies/ records that were reviewed today include: ***  Review of the above records today demonstrates: ***  Assessment and Plan:  ***   Current medicines are reviewed at length with the patient today.   The patient {ACTIONS; HAS/DOES NOT HAVE:19233} concerns regarding her medicines.  The following changes were made today:  {NONE  DEFAULTED:18576::"none"}  Labs/ tests ordered today include: *** No orders of the defined types were placed in this encounter.    Disposition:   Follow up with *** {gen number 1-15:726203} {TIME; UNITS DAY/WEEK/MONTH:19136}   Signed, Chanetta Marshall, NP 09/19/2017 11:35 AM   [facility] [address] [place]  [address] [phone] (office) [phone] (fax)

## 2017-09-26 ENCOUNTER — Ambulatory Visit: Payer: Medicare PPO | Admitting: Nurse Practitioner

## 2017-10-03 ENCOUNTER — Other Ambulatory Visit: Payer: Self-pay | Admitting: *Deleted

## 2017-10-03 ENCOUNTER — Ambulatory Visit: Payer: Medicare PPO | Admitting: Physician Assistant

## 2017-10-03 DIAGNOSIS — I471 Supraventricular tachycardia: Secondary | ICD-10-CM

## 2017-10-03 MED ORDER — DILTIAZEM HCL ER COATED BEADS 120 MG PO CP24: 120.0000 mg | ORAL_CAPSULE | Freq: Every day | ORAL | 1 refills | Status: DC

## 2017-11-15 NOTE — Progress Notes (Deleted)
Cardiology Office Note Date:  11/15/2017  Patient ID:  [name] White, [name] White [birth year], MRN [id number] PCP:  Mercy Riding, MD  Electrophysiologist:  Dr. Lovena Le  ***refresh   Chief Complaint: annual EP visit  History of Present Illness: [name] White is a 62 y.o. female with history of SVT, obesity, HTN, COPD, depression/anxiety (described as severe in prior note, hyperthyroidism, atypical AFlutter, and hx of b/l PE.  She comes in today to be seen for Dr. Lovena Le, last seen by him in May of 2018, at that time there is mention of palpitations that noted PACs/PVCs on a monitor.  She was planned for longer duration of monitoring and f/u.  I do not see that she was seen back.  She was seen in the ER in Aug 2/2 hypertension.  *** symptoms, palps *** bleeding/xarelto *** xarelto labs, last 01/2017, [place] *** flecainide ekg   Past Medical History:  Diagnosis Date  . Anxiety   . Asthma   . Chronic lower back pain   . Depression   . Family history of anesthesia complication    "daughter; causes her to pass out afterwards"  . GERD (gastroesophageal reflux disease)   . Hypertension   . Hyperthyroidism   . Migraine    "monthly" (12/28/2013)  . Osteoarthritis    "both knees; back of my neck; right pelvic bone" (12/28/2013)  . Paroxysmal A-fib ([place])   . Pulmonary embolism ([place]) 12/28/2013   "2 clots in each lung"    Past Surgical History:  Procedure Laterality Date  . ABDOMINAL HYSTERECTOMY    . APPENDECTOMY    . BREAST CYST EXCISION Right   . DILATION AND CURETTAGE OF UTERUS    . ELECTROPHYSIOLOGIC STUDY N/A 05/27/2015   Procedure: A-Flutter;  Surgeon: Evans Lance, MD;  Location: [facility];  Service: Cardiovascular;  Laterality: N/A;  . EXCISIONAL HEMORRHOIDECTOMY    . NASAL SINUS SURGERY  2007  . THYROIDECTOMY, PARTIAL Right 2005  . TUBAL LIGATION    . WISDOM TOOTH EXTRACTION      Current Outpatient Medications  Medication Sig Dispense Refill  . diltiazem  (CARDIZEM CD) 120 MG 24 hr capsule Take 1 capsule (120 mg total) by mouth daily. 30 capsule 1  . flecainide (TAMBOCOR) 100 MG tablet Take 1 tablet (100 mg total) by mouth every 12 (twelve) hours. 180 tablet 0  . FLUoxetine (PROZAC) 40 MG capsule Take 40 mg by mouth daily.    . potassium chloride SA (K-DUR,KLOR-CON) 20 MEQ tablet Take 2 tablets (40 mEq total) by mouth daily. 180 tablet 3  . rivaroxaban (XARELTO) 20 MG TABS tablet Take 1 tablet (20 mg total) by mouth daily after supper. 90 tablet 0  . traZODone (DESYREL) 50 MG tablet Take 50 mg by mouth at bedtime.     No current facility-administered medications for this visit.     Allergies:   Caffeine; Vicodin [hydrocodone-acetaminophen]; Ciprofloxacin; Erythromycin; Lisinopril; Oxycodone; and Sulfamethoxazole-trimethoprim   Social History:  The patient  reports that she quit smoking about 18 years ago. Her smoking use included cigarettes. She has a 5.00 pack-year smoking history. She has never used smokeless tobacco. She reports that she drinks alcohol. She reports that she does not use drugs.   Family History:  The patient's family history includes Asthma in her mother; Heart failure in her mother; Osteoarthritis in her mother.  ROS:  Please see the history of present illness.    All other systems are reviewed and otherwise negative.  PHYSICAL EXAM: *** VS:  There were no vitals taken for this visit. BMI: There is no height or weight on file to calculate BMI. Well nourished, well developed, in no acute distress  HEENT: normocephalic, atraumatic  Neck: no JVD, carotid bruits or masses Cardiac:  *** RRR; no significant murmurs, no rubs, or gallops Lungs:  *** CTA b/l, no wheezing, rhonchi or rales  Abd: soft, nontender MS: no deformity or *** atrophy Ext: *** no edema  Skin: warm and dry, no rash Neuro:  No gross deficits appreciated Psych: euthymic mood, full affect   EKG:  Done today shows ***  02/02/14: TTE Study  Conclusions - Left ventricle: The cavity size was normal. Wall thickness was increased in a pattern of moderate LVH. Systolic function was normal. The estimated ejection fraction was in the range of 60% to 65%. Wall motion was normal; there were no regional wall motion abnormalities. Doppler parameters are consistent with abnormal left ventricular relaxation (grade 1 diastolic dysfunction). The E/e&' ratio is between 8-15, suggesting indeterminate LV filling pressure. - Mitral valve: There was mild regurgitation. - Left atrium: The atrium was normal in size. - Right atrium: The atrium was mildly dilated. - Tricuspid valve: Poorly visualized. There was mild regurgitation. - Pulmonary arteries: PA peak pressure: 34 mm Hg (S). Impressions: - LVEF 60-65%, moderate LVH, normal LA size, mild RAE, borderline pulmonary hypertension, diastolic dysfunction, indeterminate LV fililng pressure.  Recent Labs: 02/04/2017: ALT 11; BUN 9; Creatinine, Ser 0.74; Hemoglobin 14.3; Platelets 359; Potassium 3.1; Sodium 140  No results found for requested labs within last 8760 hours.   CrCl cannot be calculated (Patient's most recent lab result is older than the maximum 21 days allowed.).   Wt Readings from Last 3 Encounters:  02/04/17 260 lb (117.9 kg)  10/03/16 261 lb 12.8 oz (118.8 kg)  09/06/15 231 lb (104.8 kg)     Other studies reviewed: Additional studies/records reviewed today include: summarized above  ASSESSMENT AND PLAN:  1. Atypical AFlutter     CHA2DS2Vasc is at least 2, on Xarelto     ***  2. SVT     ***  3. HTN     ***   Disposition: F/u with ***  Current medicines are reviewed at length with the patient today.  The patient did not have any concerns regarding medicines.***  Signed, Tommye Standard, PA-C 11/15/2017 7:49 PM     [address] [place] [address] [phone] (office)  [phone] (fax)

## 2017-11-16 ENCOUNTER — Other Ambulatory Visit: Payer: Self-pay | Admitting: *Deleted

## 2017-11-16 ENCOUNTER — Ambulatory Visit: Payer: Medicare PPO | Admitting: Physician Assistant

## 2017-11-16 ENCOUNTER — Telehealth: Payer: Self-pay | Admitting: Physician Assistant

## 2017-11-16 DIAGNOSIS — I471 Supraventricular tachycardia: Secondary | ICD-10-CM

## 2017-11-16 MED ORDER — RIVAROXABAN 20 MG PO TABS: 20.0000 mg | ORAL_TABLET | Freq: Every day | ORAL | 0 refills | Status: DC

## 2017-11-16 MED ORDER — FLECAINIDE ACETATE 100 MG PO TABS: 100.0000 mg | ORAL_TABLET | Freq: Two times a day (BID) | ORAL | 1 refills | Status: DC

## 2017-11-16 MED ORDER — DILTIAZEM HCL ER COATED BEADS 120 MG PO CP24: 120.0000 mg | ORAL_CAPSULE | Freq: Every day | ORAL | 1 refills | Status: DC

## 2017-11-16 NOTE — Telephone Encounter (Signed)
New Message:        *STAT* If patient is at the pharmacy, call can be transferred to refill team.   1. Which medications need to be refilled? (please list name of each medication and dose if known)  diltiazem (CARDIZEM CD) 120 MG 24 hr capsule   flecainide (TAMBOCOR) 100 MG tablet   rivaroxaban (XARELTO) 20 MG TABS tablet     2. Which pharmacy/location (including street and city if local pharmacy) is medication to be sent to?[facility] [place], [address] AT [place]  3. Do they need a 30 day or 90 day supply? [employer]

## 2017-11-16 NOTE — Telephone Encounter (Signed)
Pt is a 62 yr old female who last saw Dr Lovena Le on 10/03/16. She does have a pending appt on 01/03/18 with a PA in the office. Her last noted weight was 118.8Kg on 10/03/16. Serum creatine from 02/04/17 was 0.74. Her CrCL is 147 mL/min. Will refill her Xarelto 20mg  QD.

## 2017-11-27 ENCOUNTER — Encounter: Payer: Self-pay | Admitting: Internal Medicine

## 2017-11-27 ENCOUNTER — Other Ambulatory Visit: Payer: Self-pay

## 2017-11-27 ENCOUNTER — Ambulatory Visit (INDEPENDENT_AMBULATORY_CARE_PROVIDER_SITE_OTHER): Payer: Medicare PPO | Admitting: Internal Medicine

## 2017-11-27 DIAGNOSIS — T148XXA Other injury of unspecified body region, initial encounter: Secondary | ICD-10-CM | POA: Insufficient documentation

## 2017-11-27 NOTE — Progress Notes (Signed)
   [facility] Kerrin Mo, MD Phone: [phone]  Reason For Visit: SDA for Hematoma   # SDA  Patient states she noticed last Tuesday having a small bruise , and indicated it felt like a mass.  She states that it has been worsening increasing in size since Sunday.  Patient states there is some pain associated with it. Denies any trauma or falls.  Patient takes xarelto, due to hx of multiple PE and afib. No dizziness, no palpations, no SOB.   Past Medical History Reviewed problem list.  Medications- reviewed and updated No additions to family history Social history- patient is a non- smoker  Objective: BP (!) 142/88   Pulse 98   Temp 97.6 F (36.4 C) (Oral)   Ht 5\' 5"  (1.651 m)   Wt (!) 300 lb 3.2 oz (136.2 kg)   SpO2 91%   BMI 49.96 kg/m  Gen: NAD, alert, cooperative with exam Cardio: regular rate and rhythm, S1S2 heard, no murmurs appreciated Pulm: clear to auscultation bilaterally, no wheezes, rhonchi or rales Skin: Bruise with 11 x 30 cm in the LLQ, slightly hardness and pain about 4 x 4 cm        Assessment/Plan: See problem based a/p  Hematoma Large Hematoma, unsure of cause given patient denies trauma, thought on xarelto. Patient denies any symptoms of anemia - Will stop xarleto for 3 days  - CBC - Comprehensive metabolic panel - Protime-INR - Follow up in 3 days or sooner if worsening

## 2017-11-27 NOTE — Assessment & Plan Note (Addendum)
Large Hematoma, unsure of cause given patient denies trauma, thought on xarelto. Patient denies any symptoms of anemia - Will stop xarleto for 3 days  - CBC - Comprehensive metabolic panel - Protime-INR - Follow up in 3 days or sooner if worsening

## 2017-11-27 NOTE — Patient Instructions (Signed)
You have a hematoma.  This is a collection of blood.  We are going to stop your Xarelto for 3 days.  Please make sure to follow-up on Wednesday for recheck on this.  I am going to also do some blood work in the meantime.

## 2017-11-28 ENCOUNTER — Telehealth: Payer: Self-pay

## 2017-11-28 ENCOUNTER — Telehealth: Payer: Self-pay | Admitting: Internal Medicine

## 2017-11-28 LAB — COMPREHENSIVE METABOLIC PANEL
ALBUMIN: 3.8 g/dL (ref 3.6–4.8)
ALT: 16 IU/L (ref 0–32)
AST: 20 IU/L (ref 0–40)
Albumin/Globulin Ratio: 1.1 — ABNORMAL LOW (ref 1.2–2.2)
Alkaline Phosphatase: 103 IU/L (ref 39–117)
BUN / CREAT RATIO: 20 (ref 12–28)
BUN: 12 mg/dL (ref 8–27)
Bilirubin Total: 0.4 mg/dL (ref 0.0–1.2)
CO2: 25 mmol/L (ref 20–29)
CREATININE: 0.61 mg/dL (ref 0.57–1.00)
Calcium: 8.7 mg/dL (ref 8.7–10.3)
Chloride: 100 mmol/L (ref 96–106)
GFR calc Af Amer: 112 mL/min/{1.73_m2} (ref >59)
GFR, EST NON AFRICAN AMERICAN: 97 mL/min/{1.73_m2} (ref >59)
GLOBULIN, TOTAL: 3.4 g/dL (ref 1.5–4.5)
GLUCOSE: 99 mg/dL (ref 65–99)
Potassium: 3.3 mmol/L — ABNORMAL LOW (ref 3.5–5.2)
SODIUM: 141 mmol/L (ref 134–144)
Total Protein: 7.2 g/dL (ref 6.0–8.5)

## 2017-11-28 LAB — CBC
HEMATOCRIT: 37.6 % (ref 34.0–46.6)
HEMOGLOBIN: 12.9 g/dL (ref 11.1–15.9)
MCH: 32.5 pg (ref 26.6–33.0)
MCHC: 34.3 g/dL (ref 31.5–35.7)
MCV: 95 fL (ref 79–97)
Platelets: 412 10*3/uL (ref 150–450)
RBC: 3.97 x10E6/uL (ref 3.77–5.28)
RDW: 15.5 % — ABNORMAL HIGH (ref 12.3–15.4)
WBC: 7.7 10*3/uL (ref 3.4–10.8)

## 2017-11-28 LAB — PROTIME-INR
INR: 1 (ref 0.8–1.2)
Prothrombin Time: 10.6 s (ref 9.1–12.0)

## 2017-11-28 NOTE — Telephone Encounter (Signed)
Pt was seen yesterday for hematoma, calling today stating it has gotten larger and more painful, and wanting to know what she should do. Call back [phone] Wallace Cullens, RN

## 2017-11-28 NOTE — Telephone Encounter (Signed)
**  After Hours/ Emergency Line Call*  Patient reports that it seems like her hematoma that she was seen for yesterday is bigger today. She was told to hold her xarelto. She reports of some mild pain in the area with walking. Made a SDA appointment tomorrow; patient requested PM appointment. Made for 3:45PM with Dr. Garlan Fillers.  Red flags discussed.  Will forward to PCP.  Smiley Houseman, MD PGY-3, [facility]

## 2017-11-29 ENCOUNTER — Encounter: Payer: Self-pay | Admitting: Family Medicine

## 2017-11-29 ENCOUNTER — Ambulatory Visit (INDEPENDENT_AMBULATORY_CARE_PROVIDER_SITE_OTHER): Payer: Medicare PPO | Admitting: Family Medicine

## 2017-11-29 ENCOUNTER — Other Ambulatory Visit: Payer: Self-pay

## 2017-11-29 VITALS — BP 150/88 | HR 100 | Temp 98.7°F | Ht 65.0 in | Wt 301.8 lb

## 2017-11-29 DIAGNOSIS — T148XXA Other injury of unspecified body region, initial encounter: Secondary | ICD-10-CM | POA: Diagnosis not present

## 2017-11-29 MED ORDER — ALBUTEROL SULFATE HFA 108 (90 BASE) MCG/ACT IN AERS
2.0000 | INHALATION_SPRAY | Freq: Four times a day (QID) | RESPIRATORY_TRACT | 1 refills | Status: DC | PRN
Start: 2017-11-29 — End: 2018-10-18

## 2017-11-29 NOTE — Progress Notes (Signed)
Subjective: Chief Complaint  Patient presents with  . Hematoma    HPI: [name] White is a 62 y.o. presenting to clinic today to discuss the following:  Follow up for Hematoma Patient was seen in clinic 3 days ago for a large hematoma. Patient had lab work which was reassuring and was instructed to hold her anticoagulation medication Xarelto. Patient has held her Xarelto as we recommended and presents today due to increasing size of hematoma. She states it has not gotten any bigger since the morning of 6/4.  She denies any dizziness, increased pain, SOB, syncope or near syncope.  Health Maintenance: none today     ROS noted in HPI.   Past Medical, Surgical, Social, and Family History Reviewed & Updated per EMR.   Pertinent Historical Findings include:   Social History   Tobacco Use  Smoking Status Former Smoker  . Packs/day: 0.25  . Years: 20.00  . Pack years: 5.00  . Types: Cigarettes  . Last attempt to quit: 07/17/1999  . Years since quitting: 18.3  Smokeless Tobacco Never Used    Objective: BP (!) 150/88   Pulse 100   Temp 98.7 F (37.1 C) (Oral)   Ht 5\' 5"  (1.651 m)   Wt (!) 301 lb 12.8 oz (136.9 kg)   SpO2 95%   BMI 50.22 kg/m  Vitals and nursing notes reviewed  Physical Exam  Abdominal: Soft.  Large dark hematoma located at the midline to the left lower abdomen with area of induration. TTP. Measures 31cm  X 11.5cm. Please see picture below.      Results for orders placed or performed in visit on 11/27/17 (from the past 72 hour(s))  CBC     Status: Abnormal   Collection Time: 11/27/17  2:58 PM  Result Value Ref Range   WBC 7.7 3.4 - 10.8 x10E3/uL   RBC 3.97 3.77 - 5.28 x10E6/uL   Hemoglobin 12.9 11.1 - 15.9 g/dL   Hematocrit 37.6 34.0 - 46.6 %   MCV 95 79 - 97 fL   MCH 32.5 26.6 - 33.0 pg   MCHC 34.3 31.5 - 35.7 g/dL   RDW 15.5 (H) 12.3 - 15.4 %   Platelets 412 150 - 450 x10E3/uL  Comprehensive metabolic panel     Status: Abnormal   Collection Time: 11/27/17  2:58 PM  Result Value Ref Range   Glucose 99 65 - 99 mg/dL   BUN 12 8 - 27 mg/dL   Creatinine, Ser 0.61 0.57 - 1.00 mg/dL   GFR calc non Af Amer 97 >59 mL/min/1.73   GFR calc Af Amer 112 >59 mL/min/1.73   BUN/Creatinine Ratio 20 12 - 28   Sodium 141 134 - 144 mmol/L   Potassium 3.3 (L) 3.5 - 5.2 mmol/L   Chloride 100 96 - 106 mmol/L   CO2 25 20 - 29 mmol/L   Calcium 8.7 8.7 - 10.3 mg/dL   Total Protein 7.2 6.0 - 8.5 g/dL   Albumin 3.8 3.6 - 4.8 g/dL   Globulin, Total 3.4 1.5 - 4.5 g/dL   Albumin/Globulin Ratio 1.1 (L) 1.2 - 2.2   Bilirubin Total 0.4 0.0 - 1.2 mg/dL   Alkaline Phosphatase 103 39 - 117 IU/L   AST 20 0 - 40 IU/L   ALT 16 0 - 32 IU/L  Protime-INR     Status: None   Collection Time: 11/27/17  3:33 PM  Result Value Ref Range   INR 1.0 0.8 - 1.2  Comment: Reference interval is for non-anticoagulated patients. Suggested INR therapeutic range for Vitamin K antagonist therapy:    Standard Dose (moderate intensity                   therapeutic range):       2.0 - 3.0    Higher intensity therapeutic range       2.5 - 3.5    Prothrombin Time 10.6 9.1 - 12.0 sec    Assessment/Plan:  Hematoma Large unchanged hematoma since last visit. Given no concerning symptoms and CBC was normal I discussed benefits vs risks of restarting Xarelto with patient. Informed patient it was ultimately her decision but I thought it would be best to restart her Xarelto on 6/6.  She will return if hematoma worsens or if she develops any of the concerning symptoms that we discussed.   PATIENT EDUCATION PROVIDED: See AVS    Diagnosis and plan along with any newly prescribed medication(s) were discussed in detail with this patient today. The patient verbalized understanding and agreed with the plan. Patient advised if symptoms worsen return to clinic or ER.   Health Maintainance:   No orders of the defined types were placed in this encounter.   Meds ordered  this encounter  Medications  . albuterol (PROVENTIL HFA;VENTOLIN HFA) 108 (90 Base) MCG/ACT inhaler    Sig: Inhale 2 puffs into the lungs every 6 (six) hours as needed for wheezing or shortness of breath.    Dispense:  1 Inhaler    Refill:  [place], DO 11/29/2017, 5:17 PM PGY-1, [facility]

## 2017-11-29 NOTE — Telephone Encounter (Signed)
Pt already has appt to be seen today. Reymundo Winship Kennon Holter, CMA

## 2017-11-29 NOTE — Patient Instructions (Signed)
It was great to meet you today! Thank you for letting me participate in your care!  Today, we discussed hematoma. We discussed the benefits and risks of restarting Xarelto and given the hematoma has been stable and you have no concerning signs or symptoms at this time I recommend you restart it tomorrow.   Please return if you develop shortness of breath, chest pain, dizziness, extreme fatigue, or if you pass out or begin to feel like you might pass out.  Be well, Harolyn Rutherford, DO PGY-1, [facility]

## 2017-11-29 NOTE — Telephone Encounter (Signed)
Please let patient know if she is worried she can come in to be evaluated sooner. Her blood work looked good which was reassuring.

## 2017-11-29 NOTE — Assessment & Plan Note (Signed)
Large unchanged hematoma since last visit. Given no concerning symptoms and CBC was normal I discussed benefits vs risks of restarting Xarelto with patient. Informed patient it was ultimately her decision but I thought it would be best to restart her Xarelto on 6/6.  She will return if hematoma worsens or if she develops any of the concerning symptoms that we discussed.

## 2017-11-30 ENCOUNTER — Ambulatory Visit: Payer: Medicare PPO | Admitting: Internal Medicine

## 2017-12-07 ENCOUNTER — Telehealth: Payer: Self-pay

## 2017-12-07 NOTE — Telephone Encounter (Signed)
Patient called nurse line with concern that bruising has become more painful with the pain going around her side and into her back. Pain is with movement. States bruising has not increased in size but has not decreased in size. States color is fading. Also states that "mass" part of the bruising is smaller but more hard. Patient did restart Xarelto on 11/30/17.  Precepted with Dr Andria Frames. States may take several weeks to resolve, hematoma will feel more firm as it decreases in size and that pain is probably muscular and is what caused bruising in the first place. Recommend recheck in a few weeks to note resolution.  Patient given Dr. Lowella Bandy recommendations and has no further questions.  Danley Danker, RN [facility] RN)

## 2017-12-15 ENCOUNTER — Encounter: Payer: Self-pay | Admitting: Physician Assistant

## 2017-12-26 ENCOUNTER — Other Ambulatory Visit: Payer: Self-pay

## 2017-12-26 ENCOUNTER — Ambulatory Visit (INDEPENDENT_AMBULATORY_CARE_PROVIDER_SITE_OTHER): Payer: Medicare PPO | Admitting: Family Medicine

## 2017-12-26 ENCOUNTER — Encounter: Payer: Self-pay | Admitting: Family Medicine

## 2017-12-26 VITALS — BP 138/96 | HR 108 | Temp 98.1°F | Ht 65.0 in | Wt 300.6 lb

## 2017-12-26 DIAGNOSIS — S86892A Other injury of other muscle(s) and tendon(s) at lower leg level, left leg, initial encounter: Secondary | ICD-10-CM | POA: Diagnosis not present

## 2017-12-26 DIAGNOSIS — J45909 Unspecified asthma, uncomplicated: Secondary | ICD-10-CM

## 2017-12-26 DIAGNOSIS — T148XXA Other injury of unspecified body region, initial encounter: Secondary | ICD-10-CM | POA: Diagnosis not present

## 2017-12-26 HISTORY — DX: Other injury of other muscle(s) and tendon(s) at lower leg level, left leg, initial encounter: S86.892A

## 2017-12-26 MED ORDER — DICLOFENAC SODIUM 1 % TD GEL: 2.0000 g | Freq: Four times a day (QID) | TRANSDERMAL | 0 refills | Status: DC

## 2017-12-26 NOTE — Patient Instructions (Addendum)
It was great meeting you today! I am sorry that you had wheezing this morning. I am glad that it was relieved with the albuterol inhaler. I am not appreciating any wheezing on exam. I think that your left leg pain is likely caused by shin splints. A combination of rest, medications, and stretching will be the best treatment. I am giving you something called voltaren gel which you can apply to the affected areas 4-5 times per day. I recommend performing the stretches two times per day.  Regarding your hematoma I am glad that it is much smaller than the last one. I do not think holding the xarelto is necessary at this point. We will continue to watch it. If you have new symptoms and notice it getting much bigger, then please let me know.

## 2017-12-27 ENCOUNTER — Encounter: Payer: Self-pay | Admitting: Family Medicine

## 2017-12-27 NOTE — Progress Notes (Signed)
   HPI 62 year old who presents due to left leg pain. She states that the pain has been going on for 4-5 days. She states that the pain usually gets worse with activity. It is a sharp stabbing pain in her left leg essentially the length of the shin. She states that it feels very similar to shin splint she had in the past. She has tried tylenol which has not relieved her pain very much.  She also states that she awoke with some wheezing this morning. She used her albuterol inhaler which has since resolved the wheezing. She is just feeling a little "shaky" from the albuterol which is normal for her. She needs the albuterol 2-3 times per month.  She has also noticed a large bruise which has developed on her left lower back. She was dog sitting a very large dog which jumped onto her back in that spot a few days ago. Patient takes xarelto 2/2 Bilateral Pulmonary emboli and afib. Her previous hematoma which she was seen for  On 6/5 has resolved.  CC: leg pain   ROS:  Review of Systems See HPI for ROS.   CC, SH/smoking status, and VS noted  Objective: BP (!) 138/96   Pulse (!) 108   Temp 98.1 F (36.7 C) (Oral)   Ht 5\' 5"  (1.651 m)   Wt (!) 300 lb 9.6 oz (136.4 kg)   SpO2 98%   BMI 50.02 kg/m  Gen: NAD, alert, cooperative, and pleasant. Well-appearing African [place] female HEENT: NCAT, EOMI, PERRL CV: RRR, no murmur Resp: CTAB, no wheezes, non-labored Abd: SNTND, BS present, no guarding or organomegaly Ext: No edema, warm Neuro: Alert and oriented, Speech clear, No gross deficits Left Lower back: developing eccymosis. Soft, with moderate tenderness.     Assessment and plan:  Uncomplicated asthma Continue prn albuterol. Will follow up at next appointment and se how she is doing. If continues to need weekly, can think about starting controller medications.  Hematoma Bruising 2/2 xarelto. The previous bruise under her left pannus has resolved. Now has a developing hematoma in  her left lower back. This one does not appear to have the potential to get as large as the other one. Will continue to watch. Patient to let me know if it changes. Do not think holding xarelto is necessary for this one.  Left medial tibial stress syndrome Patient with all of the signs and symptoms of shin splints. She has been much more physically active recently as she has been caring for her sick mother. Rest, pain control, and stretching will be the best treatment for her. - continue tylenol - voltaren gel as needed up to 4-5 times per day to left shin - gave patient handout with numerous stretches - rest when able to - ice/heating pad for relief   No orders of the defined types were placed in this encounter.   Meds ordered this encounter  Medications  . diclofenac sodium (VOLTAREN) 1 % GEL    Sig: Apply 2 g topically 4 (four) times daily.    Dispense:  100 g    Refill:  0     [name] Dawn MD PGY-1 Family Medicine Resident  12/27/2017 3:23 PM

## 2017-12-27 NOTE — Assessment & Plan Note (Signed)
Continue prn albuterol. Will follow up at next appointment and se how she is doing. If continues to need weekly, can think about starting controller medications.

## 2017-12-27 NOTE — Assessment & Plan Note (Signed)
Bruising 2/2 xarelto. The previous bruise under her left pannus has resolved. Now has a developing hematoma in her left lower back. This one does not appear to have the potential to get as large as the other one. Will continue to watch. Patient to let me know if it changes. Do not think holding xarelto is necessary for this one.

## 2017-12-27 NOTE — Assessment & Plan Note (Signed)
Patient with all of the signs and symptoms of shin splints. She has been much more physically active recently as she has been caring for her sick mother. Rest, pain control, and stretching will be the best treatment for her. - continue tylenol - voltaren gel as needed up to 4-5 times per day to left shin - gave patient handout with numerous stretches - rest when able to - ice/heating pad for relief

## 2018-01-03 ENCOUNTER — Ambulatory Visit: Payer: Medicare PPO | Admitting: Physician Assistant

## 2018-01-16 ENCOUNTER — Other Ambulatory Visit: Payer: Self-pay | Admitting: Internal Medicine

## 2018-01-16 ENCOUNTER — Other Ambulatory Visit: Payer: Self-pay | Admitting: Family Medicine

## 2018-02-09 ENCOUNTER — Ambulatory Visit (INDEPENDENT_AMBULATORY_CARE_PROVIDER_SITE_OTHER): Payer: Medicare PPO | Admitting: Family Medicine

## 2018-02-09 ENCOUNTER — Other Ambulatory Visit: Payer: Self-pay

## 2018-02-09 ENCOUNTER — Encounter: Payer: Self-pay | Admitting: Family Medicine

## 2018-02-09 VITALS — BP 125/70 | HR 74 | Temp 98.4°F | Ht 65.0 in | Wt 300.0 lb

## 2018-02-09 DIAGNOSIS — R110 Nausea: Secondary | ICD-10-CM

## 2018-02-09 DIAGNOSIS — S86892D Other injury of other muscle(s) and tendon(s) at lower leg level, left leg, subsequent encounter: Secondary | ICD-10-CM

## 2018-02-09 DIAGNOSIS — B349 Viral infection, unspecified: Secondary | ICD-10-CM | POA: Diagnosis not present

## 2018-02-09 MED ORDER — MECLIZINE HCL 12.5 MG PO TABS: 12.5000 mg | ORAL_TABLET | Freq: Three times a day (TID) | ORAL | 0 refills | Status: DC | PRN

## 2018-02-09 NOTE — Patient Instructions (Addendum)
It was great seeing you again! I am glad that your leg has been feeling better. I think you likely had a GI virus which is still causing some nausea and vomiting. For your nausea I will give you a medication called meclizine. It will be helpful if you take it before meals. It will not prolong your QT interval which I am concerned about based on your past ekgs. Let me know if there is anything else I can do for you. Have a great weekend.

## 2018-02-13 ENCOUNTER — Encounter: Payer: Self-pay | Admitting: Family Medicine

## 2018-02-13 DIAGNOSIS — B349 Viral infection, unspecified: Secondary | ICD-10-CM | POA: Insufficient documentation

## 2018-02-13 DIAGNOSIS — R110 Nausea: Secondary | ICD-10-CM | POA: Insufficient documentation

## 2018-02-13 NOTE — Progress Notes (Signed)
   HPI 62 year old who presents for an around 5 day history of nausea, vomiting, diarrhea. She is not aware of any sick contacts. The patient had around one day of "not feeling well" characterized by fatigue and fever. After that she developed the above symptoms. She is now able to keep down liquids but still feels a little nausea with eating solid foods. Still having a little diarrhea and nausea but no vomiting for around 48 hours prior to clinic visit.  Her left leg pain, which she describes at her last visit has now resolved. A combination of rest and voltaren gel has relieved her pain.  CC: nausea, vomiting, diarrhea  ROS:   Review of Systems See HPI for ROS.   CC, SH/smoking status, and VS noted  Objective: BP 125/70 (BP Location: Right Arm, Patient Position: Sitting, Cuff Size: Large)   Pulse 74   Temp 98.4 F (36.9 C) (Oral)   Ht 5\' 5"  (1.651 m)   Wt 300 lb (136.1 kg)   SpO2 98%   BMI 49.92 kg/m  Gen: NAD, alert, cooperative, and pleasant. Well appearing middle aged [place] female. CV: RRR, no murmur Resp: CTAB, no wheezes, non-labored Abd: soft, non-distended, very mild tenderness Neuro: Alert and oriented, Speech clear, No gross deficits   Assessment and plan:  Left medial tibial stress syndrome Resolved with rest, topical nsaid, and warm compresses. Discussed continuing to do the stretches to help prevent this from coming back.  Viral illness Likely a viral GI illness. Characterized by prodrome, gradual onset, and gradual resolution. Return precautions given. Keeping down fluids well so no concern for dehydration, BP looks good.  Nausea Patient with history of borderline prolonged QT on last few ekgs. Will give antihistamine blocker as this will not prolong QT and will likely have anti-nausea properties. Counseled patient on side effects including drowsiness and urinary retention.   No orders of the defined types were placed in this encounter.   Meds ordered this  encounter  Medications  . meclizine (ANTIVERT) 12.5 MG tablet    Sig: Take 1 tablet (12.5 mg total) by mouth 3 (three) times daily as needed for dizziness.    Dispense:  30 tablet    Refill:  0     [name] MD PGY-2 Family Medicine Resident  02/13/2018 10:16 AM

## 2018-02-13 NOTE — Assessment & Plan Note (Signed)
Likely a viral GI illness. Characterized by prodrome, gradual onset, and gradual resolution. Return precautions given. Keeping down fluids well so no concern for dehydration, BP looks good.

## 2018-02-13 NOTE — Assessment & Plan Note (Signed)
Patient with history of borderline prolonged QT on last few ekgs. Will give antihistamine blocker as this will not prolong QT and will likely have anti-nausea properties. Counseled patient on side effects including drowsiness and urinary retention.

## 2018-02-13 NOTE — Assessment & Plan Note (Signed)
Resolved with rest, topical nsaid, and warm compresses. Discussed continuing to do the stretches to help prevent this from coming back.

## 2018-02-15 ENCOUNTER — Ambulatory Visit: Payer: Medicare PPO | Admitting: Physician Assistant

## 2018-03-01 ENCOUNTER — Other Ambulatory Visit: Payer: Self-pay | Admitting: Internal Medicine

## 2018-03-01 DIAGNOSIS — I471 Supraventricular tachycardia: Secondary | ICD-10-CM

## 2018-03-02 ENCOUNTER — Other Ambulatory Visit: Payer: Self-pay | Admitting: Family Medicine

## 2018-03-02 ENCOUNTER — Encounter: Payer: Self-pay | Admitting: *Deleted

## 2018-03-02 DIAGNOSIS — M1990 Unspecified osteoarthritis, unspecified site: Secondary | ICD-10-CM

## 2018-03-05 NOTE — Telephone Encounter (Signed)
Pt is overdue for follow-up with Dr Lovena Le last saw Dr Lovena Le 10/03/16, has appt scheduled with Tommye Standard 03/13/18.  Last labs 11/27/17 Creat 0.61, age 62, weight 136.1kg, CrCl 200.82, based on CrCl pt is on appropriate dosage of Xarelto 20mg  QD.  Will refill rx x 1 since pt is overdue for follow-up.

## 2018-03-06 NOTE — Progress Notes (Deleted)
Cardiology Office Note Date:  03/06/2018  Patient ID:  [name], [name] 1956-04-08, MRN [id number] PCP:  [name], MD  Electrophysiologist: Dr. Lovena Le  ***refresh   Chief Complaint: annual visit  History of Present Illness: [name] [name] is a 62 y.o. female with history of palpitations, SVT (long RP), atypical AFlutter managed with flecainide (years), obesity, asthma, HTN, hyperthyroidism, and h/o PE in 2015.  She comes today to be seen for Dr. Lovena Le.  She last saw him on April 2018, at that time with recurrent palpitations and planned for monitoring and f/u.  3 week monitor noted no AFib/flutter, no arrhythmias, rare PACs/PVCs only, I don't see f/u since then.  *** symptoms, palps *** flecainide EKG *** needs xarelto labs *** bleeding/xarelto, dose *** labs/lipids...   Past Medical History:  Diagnosis Date  . Anxiety   . Asthma   . Chronic lower back pain   . Depression   . Family history of anesthesia complication    "daughter; causes her to pass out afterwards"  . GERD (gastroesophageal reflux disease)   . Hypertension   . Hyperthyroidism   . Migraine    "monthly" (12/28/2013)  . Osteoarthritis    "both knees; back of my neck; right pelvic bone" (12/28/2013)  . Paroxysmal A-fib ([place])   . Pulmonary embolism ([place]) 12/28/2013   "2 clots in each lung"    Past Surgical History:  Procedure Laterality Date  . ABDOMINAL HYSTERECTOMY    . APPENDECTOMY    . BREAST CYST EXCISION Right   . DILATION AND CURETTAGE OF UTERUS    . ELECTROPHYSIOLOGIC STUDY N/A 05/27/2015   Procedure: A-Flutter;  Surgeon: Evans Lance, MD;  Location: [facility];  Service: Cardiovascular;  Laterality: N/A;  . EXCISIONAL HEMORRHOIDECTOMY    . NASAL SINUS SURGERY  2007  . THYROIDECTOMY, PARTIAL Right 2005  . TUBAL LIGATION    . WISDOM TOOTH EXTRACTION      Current Outpatient Medications  Medication Sig Dispense Refill  . acetaminophen (TYLENOL) 500 MG tablet Take 500 mg  by mouth every 6 (six) hours as needed.    Marland Kitchen albuterol (PROVENTIL HFA;VENTOLIN HFA) 108 (90 Base) MCG/ACT inhaler Inhale 2 puffs into the lungs every 6 (six) hours as needed for wheezing or shortness of breath. 1 Inhaler 1  . diclofenac sodium (VOLTAREN) 1 % GEL APPLY 2 GRAMS EXTERNALLY TO THE AFFECTED AREA FOUR TIMES DAILY 100 g 0  . diltiazem (CARDIZEM CD) 120 MG 24 hr capsule TAKE 1 CAPSULE BY MOUTH DAILY. PLEASE KEEP UPCOMING APPT IN AUGUST 30 capsule 0  . flecainide (TAMBOCOR) 100 MG tablet TAKE 1 TABLET(100 MG) BY MOUTH EVERY 12 HOURS 60 tablet 0  . loratadine (CLARITIN) 10 MG tablet Take 10 mg by mouth daily.    . meclizine (ANTIVERT) 12.5 MG tablet Take 1 tablet (12.5 mg total) by mouth 3 (three) times daily as needed for dizziness. 30 tablet 0  . potassium chloride SA (K-DUR,KLOR-CON) 20 MEQ tablet Take 2 tablets (40 mEq total) by mouth daily. 180 tablet 3  . traZODone (DESYREL) 50 MG tablet Take 50 mg by mouth at bedtime.    Marland Kitchen venlafaxine XR (EFFEXOR-XR) 150 MG 24 hr capsule Take 150 mg by mouth daily with breakfast.    . XARELTO 20 MG TABS tablet TAKE 1 TABLET(20 MG) BY MOUTH DAILY AFTER SUPPER 90 tablet 0   No current facility-administered medications for this visit.     Allergies:   Caffeine; Vicodin [hydrocodone-acetaminophen]; Hydralazine  hcl; Hydrocodone-acetaminophen; Ciprofloxacin; Erythromycin; Lisinopril; Oxycodone; and Sulfamethoxazole-trimethoprim   Social History:  The patient  reports that she quit smoking about 18 years ago. Her smoking use included cigarettes. She has a 5.00 pack-year smoking history. She has never used smokeless tobacco. She reports that she drinks alcohol. She reports that she does not use drugs.   Family History:  The patient's family history includes Asthma in her mother; Heart failure in her mother; Osteoarthritis in her mother.  ROS:  Please see the history of present illness.  All other systems are reviewed and otherwise negative.   PHYSICAL  EXAM: *** VS:  There were no vitals taken for this visit. BMI: There is no height or weight on file to calculate BMI. Well nourished, well developed, in no acute distress  HEENT: normocephalic, atraumatic  Neck: no JVD, carotid bruits or masses Cardiac:  *** RRR; no significant murmurs, no rubs, or gallops Lungs:  *** CTA b/l, no wheezing, rhonchi or rales  Abd: soft, nontender MS: no deformity or *** atrophy Ext: *** no edema  Skin: warm and dry, no rash Neuro:  No gross deficits appreciated Psych: euthymic mood, full affect    EKG:  Done today and reviewed by myself: ***   02/02/14: TTE Study Conclusions - Left ventricle: The cavity size was normal. Wall thickness was increased in a pattern of moderate LVH. Systolic function was normal. The estimated ejection fraction was in the range of 60% to 65%. Wall motion was normal; there were no regional wall motion abnormalities. Doppler parameters are consistent with abnormal left ventricular relaxation (grade 1 diastolic dysfunction). The E/e&' ratio is between 8-15, suggesting indeterminate LV filling pressure. - Mitral valve: There was mild regurgitation. - Left atrium: The atrium was normal in size. - Right atrium: The atrium was mildly dilated. - Tricuspid valve: Poorly visualized. There was mild regurgitation. - Pulmonary arteries: PA peak pressure: 34 mm Hg (S). Impressions - LVEF 60-65%, moderate LVH, normal LA size, mild RAE, borderline pulmonary hypertension, diastolic dysfunction, indeterminate LV fililng pressure.  Recent Labs: 11/27/2017: ALT 16; BUN 12; Creatinine, Ser 0.61; Hemoglobin 12.9; Platelets 412; Potassium 3.3; Sodium 141  No results found for requested labs within last 8760 hours.   CrCl cannot be calculated (Patient's most recent lab result is older than the maximum 21 days allowed.).   Wt Readings from Last 3 Encounters:  02/09/18 300 lb (136.1 kg)  12/26/17 (!) 300 lb 9.6 oz (136.4  kg)  11/29/17 (!) 301 lb 12.8 oz (136.9 kg)     Other studies reviewed: Additional studies/records reviewed today include: summarized above  ASSESSMENT AND PLAN:  1. SVT 2. Hx of atypical AFlutter     CHA2DS2Vasc is one (also w/hx of PE), on xarelto     Flecainde/cardizem     ***   3. HTN     ***  Disposition: F/u with ***  Current medicines are reviewed at length with the patient today.  The patient did not have any concerns regarding medicines.***  Signed, Tommye Standard, PA-C 03/06/2018 10:24 AM     [facility] [address] [place] [address] [phone] (office)  [phone] (fax)

## 2018-03-13 ENCOUNTER — Ambulatory Visit: Payer: Medicare PPO | Admitting: Physician Assistant

## 2018-04-04 ENCOUNTER — Other Ambulatory Visit: Payer: Self-pay | Admitting: Family Medicine

## 2018-04-04 ENCOUNTER — Other Ambulatory Visit: Payer: Self-pay | Admitting: Internal Medicine

## 2018-04-04 DIAGNOSIS — I471 Supraventricular tachycardia: Secondary | ICD-10-CM

## 2018-04-10 ENCOUNTER — Ambulatory Visit: Payer: Medicare PPO | Admitting: Nurse Practitioner

## 2018-04-10 NOTE — Progress Notes (Deleted)
CARDIOLOGY OFFICE NOTE  Date:  04/10/2018    Kaplan Desanctis Date of Birth: 1955-10-07 Medical Record #[id number]  PCP:  [name], MD  Cardiologist:  Servando Snare & ***    No chief complaint on file.   History of Present Illness: [name] White is a 62 y.o. female who presents today for a ***   Comes in today. Here with   Past Medical History:  Diagnosis Date  . Anxiety   . Asthma   . Chronic lower back pain   . Depression   . Family history of anesthesia complication    "daughter; causes her to pass out afterwards"  . GERD (gastroesophageal reflux disease)   . Hypertension   . Hyperthyroidism   . Migraine    "monthly" (12/28/2013)  . Osteoarthritis    "both knees; back of my neck; right pelvic bone" (12/28/2013)  . Paroxysmal A-fib ([place])   . Pulmonary embolism ([place]) 12/28/2013   "2 clots in each lung"    Past Surgical History:  Procedure Laterality Date  . ABDOMINAL HYSTERECTOMY    . APPENDECTOMY    . BREAST CYST EXCISION Right   . DILATION AND CURETTAGE OF UTERUS    . ELECTROPHYSIOLOGIC STUDY N/A 05/27/2015   Procedure: A-Flutter;  Surgeon: Evans Lance, MD;  Location: [facility];  Service: Cardiovascular;  Laterality: N/A;  . EXCISIONAL HEMORRHOIDECTOMY    . NASAL SINUS SURGERY  2007  . THYROIDECTOMY, PARTIAL Right 2005  . TUBAL LIGATION    . WISDOM TOOTH EXTRACTION       Medications: No outpatient medications have been marked as taking for the 04/10/18 encounter (Appointment) with Burtis Junes, NP.     Allergies: Allergies  Allergen Reactions  . Caffeine Other (See Comments)    Migraine  . Vicodin [Hydrocodone-Acetaminophen] Nausea And Vomiting and Other (See Comments)    Headache   . Hydralazine Hcl     Other reaction(s): Other (See Comments) AKI leading to rhabdo and electrolyte abnormalities  . Hydrocodone-Acetaminophen Nausea And Vomiting  . Ciprofloxacin Hives and Rash  . Erythromycin Hives and Rash  . Lisinopril  Cough    Other reaction(s): Cough  . Oxycodone Nausea And Vomiting    headache Other reaction(s): Vomiting  . Sulfamethoxazole-Trimethoprim Rash    Social History: The patient  reports that she quit smoking about 18 years ago. Her smoking use included cigarettes. She has a 5.00 pack-year smoking history. She has never used smokeless tobacco. She reports that she drinks alcohol. She reports that she does not use drugs.   Family History: The patient's ***family history includes Asthma in her mother; Heart failure in her mother; Osteoarthritis in her mother.   Review of Systems: Please see the history of present illness.   Otherwise, the review of systems is positive for {NONE DEFAULTED:18576::"none"}.   All other systems are reviewed and negative.   Physical Exam: VS:  There were no vitals taken for this visit. Marland Kitchen  BMI There is no height or weight on file to calculate BMI.  Wt Readings from Last 3 Encounters:  02/09/18 300 lb (136.1 kg)  12/26/17 (!) 300 lb 9.6 oz (136.4 kg)  11/29/17 (!) 301 lb 12.8 oz (136.9 kg)    General: Pleasant. Well developed, well nourished and in no acute distress.   HEENT: Normal.  Neck: Supple, no JVD, carotid bruits, or masses noted.  Cardiac: ***Regular rate and rhythm. No murmurs, rubs, or gallops. No edema.  Respiratory:  Lungs are clear to auscultation bilaterally with normal work of breathing.  GI: Soft and nontender.  MS: No deformity or atrophy. Gait and ROM intact.  Skin: Warm and dry. Color is normal.  Neuro:  Strength and sensation are intact and no gross focal deficits noted.  Psych: Alert, appropriate and with normal affect.   LABORATORY DATA:  EKG:  EKG {ACTION; IS/IS XTK:24097353} ordered today. This demonstrates ***.  Lab Results  Component Value Date   WBC 7.7 11/27/2017   HGB 12.9 11/27/2017   HCT 37.6 11/27/2017   PLT 412 11/27/2017   GLUCOSE 99 11/27/2017   CHOL 205 (H) 09/08/2015   TRIG 81 09/08/2015   HDL 87 09/08/2015    LDLCALC 102 (H) 09/08/2015   ALT 16 11/27/2017   AST 20 11/27/2017   NA 141 11/27/2017   K 3.3 (L) 11/27/2017   CL 100 11/27/2017   CREATININE 0.61 11/27/2017   BUN 12 11/27/2017   CO2 25 11/27/2017   TSH 2.270 09/08/2015   INR 1.0 11/27/2017   HGBA1C 5.9 (H) 09/08/2015     BNP (last 3 results) No results for input(s): BNP in the last 8760 hours.  ProBNP (last 3 results) No results for input(s): PROBNP in the last 8760 hours.   Other Studies Reviewed Today:   Assessment/Plan:   Current medicines are reviewed with the patient today.  The patient does not have concerns regarding medicines other than what has been noted above.  The following changes have been made:  See above.  Labs/ tests ordered today include:   No orders of the defined types were placed in this encounter.    Disposition:   FU with *** in {gen number 2-99:242683} {Days to years:10300}.   Patient is agreeable to this plan and will call if any problems develop in the interim.   SignedTruitt Merle, NP  04/10/2018 7:39 AM  [place] [address] [place], [place]  [address] Phone: [phone] Fax: [phone]

## 2018-04-29 ENCOUNTER — Other Ambulatory Visit: Payer: Self-pay | Admitting: Family Medicine

## 2018-04-29 ENCOUNTER — Other Ambulatory Visit: Payer: Self-pay | Admitting: Internal Medicine

## 2018-04-29 DIAGNOSIS — I471 Supraventricular tachycardia: Secondary | ICD-10-CM

## 2018-05-29 ENCOUNTER — Ambulatory Visit: Payer: Medicare PPO | Admitting: Physician Assistant

## 2018-06-07 ENCOUNTER — Other Ambulatory Visit: Payer: Self-pay

## 2018-06-07 DIAGNOSIS — I471 Supraventricular tachycardia: Secondary | ICD-10-CM

## 2018-06-07 MED ORDER — FLECAINIDE ACETATE 100 MG PO TABS: 100.0000 mg | ORAL_TABLET | Freq: Two times a day (BID) | ORAL | 0 refills | Status: DC

## 2018-06-07 MED ORDER — DILTIAZEM HCL ER COATED BEADS 120 MG PO CP24: 120.0000 mg | ORAL_CAPSULE | Freq: Every day | ORAL | 0 refills | Status: DC

## 2018-06-08 ENCOUNTER — Telehealth: Payer: Self-pay

## 2018-06-08 NOTE — Telephone Encounter (Signed)
Patient called nurse line stating she is visiting her daughter in [place] and has become ill with cough, congeston, aches. Has asthma. Denies fever. No SOB noted during conversation. Daughter was also ill last week.  Advised patient to use her nebulizer and see if that improves her cough. Advised UC visit if no better so someone can listen to her lungs and evaluate her. Patient agreeable.  Danley Danker, RN [facility] RN)

## 2018-06-13 ENCOUNTER — Other Ambulatory Visit: Payer: Self-pay | Admitting: Internal Medicine

## 2018-06-13 DIAGNOSIS — I471 Supraventricular tachycardia: Secondary | ICD-10-CM

## 2018-06-13 MED ORDER — FLECAINIDE ACETATE 100 MG PO TABS: 100.0000 mg | ORAL_TABLET | Freq: Two times a day (BID) | ORAL | 0 refills | Status: DC

## 2018-06-13 MED ORDER — DILTIAZEM HCL ER COATED BEADS 120 MG PO CP24: 120.0000 mg | ORAL_CAPSULE | Freq: Every day | ORAL | 0 refills | Status: DC

## 2018-06-13 MED ORDER — RIVAROXABAN 20 MG PO TABS: ORAL_TABLET | ORAL | 0 refills | Status: DC

## 2018-06-13 NOTE — Addendum Note (Signed)
Addended by: Derrel Nip B on: 06/13/2018 03:45 PM   Modules accepted: Orders

## 2018-06-13 NOTE — Telephone Encounter (Signed)
New message    *STAT* If patient is at the pharmacy, call can be transferred to refill team.   1. Which medications need to be refilled? (please list name of each medication and dose if known) flecainide (TAMBOCOR) 100 MG tablet    iltiazem (CARDIZEM CD) 120 MG 24 hr capsule    XARELTO 20 MG TABS tablet  2. Which pharmacy/location (including street and city if local pharmacy) is medication to be sent to?[facility] [place] [address]  3. Do they need a 30 day or 90 day supply? 30  Patient is in the hospital in [place], [place]. The name of the hospital is [facility] patient will not be able to make the appt for 06/15/18.

## 2018-06-13 NOTE — Telephone Encounter (Signed)
Xarelto 20mg  refill request received from refill dept as pt is out of town; pt is 62 yrs old, wt-136.1kg, Crea-0.61 on 11/27/17, last seen by Dr. Lovena Le on 10/03/16-pt overdue and has an apppt with Dr. Lovena Le on 07/04/18 at 315pm. Per Sonia Baller RN of Dr. Lovena Le, the pt can have a 30 day supply and not more as pt has been counseled regarding this (please refer to not e on 12/18 at 1059am).  Will send in a 30 day supply only.

## 2018-06-13 NOTE — Telephone Encounter (Signed)
Called pt and left message informing pt that I was sending in a 30 day supply for her medications and that she needed to keep upcoming appt in January before anymore refills or pt will have to refill with PCP and if pt has any other problems, questions or concerns to call the office back. Pt also needs Xarelto refilled. Please address

## 2018-06-13 NOTE — Telephone Encounter (Signed)
Pt calling requesting a refill on medications. This pt has had multiple cancellations and has not been seen since 10/03/2016. Pt has an appt on 07/04/18. Would Dr. Lovena Le like to refill these medications? Please address

## 2018-06-13 NOTE — Telephone Encounter (Signed)
May give Pt 30 more days worth of requested medications.  No further refills will be authorized.  Pt has been councelled that she must see this ordering physician for further refills.

## 2018-06-15 ENCOUNTER — Ambulatory Visit: Payer: Medicare PPO | Admitting: Physician Assistant

## 2018-06-15 ENCOUNTER — Telehealth: Payer: Self-pay

## 2018-06-15 NOTE — Telephone Encounter (Signed)
Advised refills to give Pt one more 30 day supply of medications.  Pt has already been counselled x 3 on needing to keep follow up appointment.s  NO FURTHER REFILLS without appointment.

## 2018-06-18 ENCOUNTER — Ambulatory Visit: Payer: Medicare PPO | Admitting: Family Medicine

## 2018-07-01 ENCOUNTER — Other Ambulatory Visit: Payer: Self-pay

## 2018-07-01 ENCOUNTER — Encounter (HOSPITAL_COMMUNITY): Payer: Self-pay

## 2018-07-01 ENCOUNTER — Emergency Department (HOSPITAL_COMMUNITY): Payer: Medicare HMO

## 2018-07-01 ENCOUNTER — Observation Stay (HOSPITAL_COMMUNITY)
Admission: EM | Admit: 2018-07-01 | Discharge: 2018-07-03 | Disposition: A | Discharge status: Home / Self Care | Payer: Medicare HMO | Attending: Family Medicine | Admitting: Family Medicine

## 2018-07-01 DIAGNOSIS — M1990 Unspecified osteoarthritis, unspecified site: Secondary | ICD-10-CM | POA: Insufficient documentation

## 2018-07-01 DIAGNOSIS — M545 Low back pain: Secondary | ICD-10-CM | POA: Insufficient documentation

## 2018-07-01 DIAGNOSIS — Z7901 Long term (current) use of anticoagulants: Secondary | ICD-10-CM | POA: Insufficient documentation

## 2018-07-01 DIAGNOSIS — J441 Chronic obstructive pulmonary disease with (acute) exacerbation: Secondary | ICD-10-CM | POA: Diagnosis not present

## 2018-07-01 DIAGNOSIS — F419 Anxiety disorder, unspecified: Secondary | ICD-10-CM | POA: Diagnosis not present

## 2018-07-01 DIAGNOSIS — I484 Atypical atrial flutter: Secondary | ICD-10-CM | POA: Diagnosis not present

## 2018-07-01 DIAGNOSIS — G8929 Other chronic pain: Secondary | ICD-10-CM | POA: Diagnosis not present

## 2018-07-01 DIAGNOSIS — F339 Major depressive disorder, recurrent, unspecified: Secondary | ICD-10-CM | POA: Diagnosis not present

## 2018-07-01 DIAGNOSIS — R0682 Tachypnea, not elsewhere classified: Secondary | ICD-10-CM | POA: Diagnosis not present

## 2018-07-01 DIAGNOSIS — Z6841 Body Mass Index (BMI) 40.0 and over, adult: Secondary | ICD-10-CM | POA: Insufficient documentation

## 2018-07-01 DIAGNOSIS — I10 Essential (primary) hypertension: Secondary | ICD-10-CM | POA: Diagnosis not present

## 2018-07-01 DIAGNOSIS — E0500 Thyrotoxicosis with diffuse goiter without thyrotoxic crisis or storm: Secondary | ICD-10-CM | POA: Insufficient documentation

## 2018-07-01 DIAGNOSIS — I4892 Unspecified atrial flutter: Secondary | ICD-10-CM | POA: Insufficient documentation

## 2018-07-01 DIAGNOSIS — R498 Other voice and resonance disorders: Secondary | ICD-10-CM | POA: Diagnosis not present

## 2018-07-01 DIAGNOSIS — J383 Other diseases of vocal cords: Secondary | ICD-10-CM | POA: Diagnosis present

## 2018-07-01 DIAGNOSIS — G4733 Obstructive sleep apnea (adult) (pediatric): Secondary | ICD-10-CM | POA: Diagnosis not present

## 2018-07-01 DIAGNOSIS — E876 Hypokalemia: Secondary | ICD-10-CM | POA: Diagnosis not present

## 2018-07-01 DIAGNOSIS — J45901 Unspecified asthma with (acute) exacerbation: Principal | ICD-10-CM | POA: Insufficient documentation

## 2018-07-01 DIAGNOSIS — Z79899 Other long term (current) drug therapy: Secondary | ICD-10-CM | POA: Diagnosis not present

## 2018-07-01 DIAGNOSIS — K219 Gastro-esophageal reflux disease without esophagitis: Secondary | ICD-10-CM | POA: Diagnosis not present

## 2018-07-01 DIAGNOSIS — Z86711 Personal history of pulmonary embolism: Secondary | ICD-10-CM | POA: Diagnosis not present

## 2018-07-01 DIAGNOSIS — F329 Major depressive disorder, single episode, unspecified: Secondary | ICD-10-CM | POA: Diagnosis present

## 2018-07-01 DIAGNOSIS — I480 Paroxysmal atrial fibrillation: Secondary | ICD-10-CM | POA: Diagnosis not present

## 2018-07-01 DIAGNOSIS — J479 Bronchiectasis, uncomplicated: Secondary | ICD-10-CM | POA: Insufficient documentation

## 2018-07-01 DIAGNOSIS — R918 Other nonspecific abnormal finding of lung field: Secondary | ICD-10-CM | POA: Diagnosis not present

## 2018-07-01 DIAGNOSIS — R000 Tachycardia, unspecified: Secondary | ICD-10-CM | POA: Diagnosis not present

## 2018-07-01 DIAGNOSIS — R062 Wheezing: Secondary | ICD-10-CM | POA: Diagnosis not present

## 2018-07-01 LAB — COMPREHENSIVE METABOLIC PANEL
ALT: 29 U/L (ref 0–44)
AST: 25 U/L (ref 15–41)
Albumin: 3.9 g/dL (ref 3.5–5.0)
Alkaline Phosphatase: 87 U/L (ref 38–126)
Anion gap: 13 (ref 5–15)
BUN: 22 mg/dL (ref 8–23)
CO2: 23 mmol/L (ref 22–32)
Calcium: 9 mg/dL (ref 8.9–10.3)
Chloride: 104 mmol/L (ref 98–111)
Creatinine, Ser: 1 mg/dL (ref 0.44–1.00)
GFR calc Af Amer: >60 mL/min (ref >60)
GFR calc non Af Amer: 60 mL/min — ABNORMAL LOW (ref >60)
Glucose, Bld: 139 mg/dL — ABNORMAL HIGH (ref 70–99)
Potassium: 3 mmol/L — ABNORMAL LOW (ref 3.5–5.1)
Sodium: 140 mmol/L (ref 135–145)
Total Bilirubin: 0.8 mg/dL (ref 0.3–1.2)
Total Protein: 7.9 g/dL (ref 6.5–8.1)

## 2018-07-01 LAB — BLOOD GAS, VENOUS
Acid-Base Excess: 2.5 mmol/L — ABNORMAL HIGH (ref 0.0–2.0)
Bicarbonate: 24.9 mmol/L (ref 20.0–28.0)
O2 Saturation: 89.5 %
PATIENT TEMPERATURE: 98.6
pCO2, Ven: 32.6 mmHg — ABNORMAL LOW (ref 44.0–60.0)
pH, Ven: 7.495 — ABNORMAL HIGH (ref 7.250–7.430)
pO2, Ven: 53.8 mmHg — ABNORMAL HIGH (ref 32.0–45.0)

## 2018-07-01 LAB — TROPONIN I: Troponin I: <0.03 ng/mL (ref <0.03)

## 2018-07-01 LAB — BLOOD GAS, ARTERIAL
Acid-Base Excess: 1.2 mmol/L (ref 0.0–2.0)
Bicarbonate: 24.1 mmol/L (ref 20.0–28.0)
Delivery systems: POSITIVE
Drawn by: 44126
EXPIRATORY PAP: 5
FIO2: 30
INSPIRATORY PAP: 10
O2 Saturation: 96.5 %
PH ART: 7.46 — AB (ref 7.350–7.450)
Patient temperature: 98.6
pCO2 arterial: 34.3 mmHg (ref 32.0–48.0)
pO2, Arterial: 86.3 mmHg (ref 83.0–108.0)

## 2018-07-01 LAB — CBC WITH DIFFERENTIAL/PLATELET
Abs Immature Granulocytes: 0.08 10*3/uL — ABNORMAL HIGH (ref 0.00–0.07)
Basophils Absolute: 0.1 10*3/uL (ref 0.0–0.1)
Basophils Relative: 0 %
EOS PCT: 2 %
Eosinophils Absolute: 0.2 10*3/uL (ref 0.0–0.5)
HCT: 42.7 % (ref 36.0–46.0)
Hemoglobin: 13.5 g/dL (ref 12.0–15.0)
Immature Granulocytes: 1 %
Lymphocytes Relative: 19 %
Lymphs Abs: 2.4 10*3/uL (ref 0.7–4.0)
MCH: 30.6 pg (ref 26.0–34.0)
MCHC: 31.6 g/dL (ref 30.0–36.0)
MCV: 96.8 fL (ref 80.0–100.0)
Monocytes Absolute: 0.6 10*3/uL (ref 0.1–1.0)
Monocytes Relative: 5 %
Neutro Abs: 9.1 10*3/uL — ABNORMAL HIGH (ref 1.7–7.7)
Neutrophils Relative %: 73 %
Platelets: 346 10*3/uL (ref 150–400)
RBC: 4.41 MIL/uL (ref 3.87–5.11)
RDW: 16.5 % — ABNORMAL HIGH (ref 11.5–15.5)
WBC: 12.4 10*3/uL — ABNORMAL HIGH (ref 4.0–10.5)
nRBC: 0 % (ref 0.0–0.2)

## 2018-07-01 LAB — BRAIN NATRIURETIC PEPTIDE: B Natriuretic Peptide: 22.4 pg/mL (ref 0.0–100.0)

## 2018-07-01 LAB — PROTIME-INR
INR: 1.42
Prothrombin Time: 17.2 seconds — ABNORMAL HIGH (ref 11.4–15.2)

## 2018-07-01 MED ORDER — BUDESONIDE 0.5 MG/2ML IN SUSP
0.5000 mg | Freq: Two times a day (BID) | RESPIRATORY_TRACT | Status: DC
  Administered 2018-07-01 – 2018-07-03 (×5): 0.5 mg via RESPIRATORY_TRACT
  Filled 2018-07-01 (×5): qty 2

## 2018-07-01 MED ORDER — LORATADINE 10 MG PO TABS
10.0000 mg | ORAL_TABLET | Freq: Every day | ORAL | Status: DC
  Administered 2018-07-01 – 2018-07-03 (×3): 10 mg via ORAL
  Filled 2018-07-01 (×3): qty 1

## 2018-07-01 MED ORDER — CYCLOSPORINE 0.05 % OP EMUL
1.0000 [drp] | Freq: Two times a day (BID) | OPHTHALMIC | Status: DC
  Administered 2018-07-01 – 2018-07-03 (×4): 1 [drp] via OPHTHALMIC
  Filled 2018-07-01 (×5): qty 1

## 2018-07-01 MED ORDER — DICLOFENAC SODIUM 1 % TD GEL
2.0000 g | Freq: Four times a day (QID) | TRANSDERMAL | Status: DC
  Administered 2018-07-01 – 2018-07-03 (×6): 2 g via TOPICAL
  Filled 2018-07-01: qty 100

## 2018-07-01 MED ORDER — IPRATROPIUM-ALBUTEROL 0.5-2.5 (3) MG/3ML IN SOLN
3.0000 mL | Freq: Four times a day (QID) | RESPIRATORY_TRACT | Status: DC
  Administered 2018-07-01 (×2): 3 mL via RESPIRATORY_TRACT
  Filled 2018-07-01 (×2): qty 3

## 2018-07-01 MED ORDER — MAGNESIUM SULFATE 2 GM/50ML IV SOLN
2.0000 g | Freq: Once | INTRAVENOUS | Status: AC
  Administered 2018-07-01: 2 g via INTRAVENOUS
  Filled 2018-07-01: qty 50

## 2018-07-01 MED ORDER — POTASSIUM CHLORIDE 20 MEQ/15ML (10%) PO SOLN
40.0000 meq | Freq: Once | ORAL | Status: AC
  Administered 2018-07-01: 40 meq via ORAL
  Filled 2018-07-01: qty 30

## 2018-07-01 MED ORDER — IPRATROPIUM-ALBUTEROL 0.5-2.5 (3) MG/3ML IN SOLN
3.0000 mL | Freq: Three times a day (TID) | RESPIRATORY_TRACT | Status: DC
  Administered 2018-07-02 – 2018-07-03 (×5): 3 mL via RESPIRATORY_TRACT
  Filled 2018-07-01 (×5): qty 3

## 2018-07-01 MED ORDER — FLECAINIDE ACETATE 100 MG PO TABS
100.0000 mg | ORAL_TABLET | Freq: Two times a day (BID) | ORAL | Status: DC
  Administered 2018-07-01 – 2018-07-03 (×5): 100 mg via ORAL
  Filled 2018-07-01 (×5): qty 1

## 2018-07-01 MED ORDER — MAGNESIUM SULFATE 50 % IJ SOLN: 2.0000 g | Freq: Once | INTRAMUSCULAR | Status: DC

## 2018-07-01 MED ORDER — DILTIAZEM HCL ER COATED BEADS 120 MG PO CP24
120.0000 mg | ORAL_CAPSULE | Freq: Every day | ORAL | Status: DC
  Administered 2018-07-01 – 2018-07-03 (×3): 120 mg via ORAL
  Filled 2018-07-01 (×3): qty 1

## 2018-07-01 MED ORDER — ALBUTEROL (5 MG/ML) CONTINUOUS INHALATION SOLN
10.0000 mg/h | INHALATION_SOLUTION | RESPIRATORY_TRACT | Status: DC
  Administered 2018-07-01: 10 mg/h via RESPIRATORY_TRACT
  Filled 2018-07-01: qty 20

## 2018-07-01 MED ORDER — ALBUTEROL SULFATE (2.5 MG/3ML) 0.083% IN NEBU
2.5000 mg | INHALATION_SOLUTION | RESPIRATORY_TRACT | Status: DC | PRN
  Administered 2018-07-01: 2.5 mg via RESPIRATORY_TRACT

## 2018-07-01 MED ORDER — ALPRAZOLAM 0.25 MG PO TABS
0.2500 mg | ORAL_TABLET | Freq: Two times a day (BID) | ORAL | Status: DC | PRN
  Administered 2018-07-01 – 2018-07-02 (×4): 0.25 mg via ORAL
  Filled 2018-07-01 (×4): qty 1

## 2018-07-01 MED ORDER — RIVAROXABAN 20 MG PO TABS
20.0000 mg | ORAL_TABLET | Freq: Every day | ORAL | Status: DC
  Administered 2018-07-01 – 2018-07-02 (×2): 20 mg via ORAL
  Filled 2018-07-01 (×2): qty 1

## 2018-07-01 MED ORDER — VENLAFAXINE HCL ER 75 MG PO CP24
150.0000 mg | ORAL_CAPSULE | Freq: Every day | ORAL | Status: DC
  Administered 2018-07-01 – 2018-07-03 (×3): 150 mg via ORAL
  Filled 2018-07-01 (×3): qty 2

## 2018-07-01 NOTE — ED Notes (Signed)
RT has been called about ABG.

## 2018-07-01 NOTE — ED Notes (Signed)
Report given to [facility] for 4W, Room 1423.

## 2018-07-01 NOTE — ED Notes (Signed)
ED TO INPATIENT HANDOFF REPORT  Name/Age/Gender [name] White 63 y.o. female  Code Status    Code Status Orders  (From admission, onward)         Start     Ordered   07/01/18 1138  Full code  Continuous     07/01/18 1138        Code Status History    Date Active Date Inactive Code Status Order ID Comments User Context   09/07/2015 2209 09/16/2015 1819 Full Code [id number]  Rennie Plowman Inpatient   09/06/2015 2307 09/07/2015 2209 Full Code [id number]  [place], PA-C ED   05/27/2015 1359 05/28/2015 1419 Full Code [id number]  Evans Lance, MD Inpatient   03/22/2015 1734 03/23/2015 2127 Full Code [id number]  Janora Norlander, DO Inpatient   09/28/2014 0241 10/01/2014 0635 Full Code [id number]  Rosemarie Ax, MD Inpatient   03/05/2014 1333 03/06/2014 0422 Full Code [id number]  Starlyn Skeans, PA-C ED   01/31/2014 1103 02/04/2014 1831 Full Code [id number]  [place] Sinclair, MD Inpatient   12/28/2013 0155 12/28/2013 1844 Full Code [id number]  Archie Patten, MD Inpatient   04/06/2013 2305 04/08/2013 0006 Full Code [id number]  [place] Sinclair, MD Inpatient      Home/SNF/Other Home  Chief Complaint wheezing  Level of Care/Admitting Diagnosis ED Disposition    ED Disposition Condition [facility] [[id number]]  Level of Care: Med-Surg [16]  Diagnosis: Asthma exacerbation [761607]  Admitting Physician: Edwin Dada [[id number]]  Attending Physician: Edwin Dada [[id number]]  PT Class (Do Not Modify): Observation [104]  PT Acc Code (Do Not Modify): Observation [10022]       Medical History Past Medical History:  Diagnosis Date  . Anxiety   . Asthma   . Chronic lower back pain   . Depression   . Family history of anesthesia complication    "daughter; causes her to pass out afterwards"  . GERD (gastroesophageal reflux disease)   . Hypertension   . Hyperthyroidism   . Migraine    "monthly" (12/28/2013)  . Osteoarthritis    "both knees; back of my neck; right pelvic bone" (12/28/2013)  . Paroxysmal A-fib ([place])   . Pulmonary embolism ([place]) 12/28/2013   "2 clots in each lung"    Allergies Allergies  Allergen Reactions  . Caffeine Other (See Comments)    Migraine  . Vicodin [Hydrocodone-Acetaminophen] Nausea And Vomiting and Other (See Comments)    Headache   . Hydralazine Hcl     Other reaction(s): Other (See Comments) AKI leading to rhabdo and electrolyte abnormalities  . Hydrocodone-Acetaminophen Nausea And Vomiting  . Ciprofloxacin Hives and Rash  . Erythromycin Hives and Rash  . Lisinopril Cough    Other reaction(s): Cough  . Oxycodone Nausea And Vomiting    headache Other reaction(s): Vomiting  . Sulfamethoxazole-Trimethoprim Rash    IV Location/Drains/Wounds Patient Lines/Drains/Airways Status   Active Line/Drains/Airways    Name:   Placement date:   Placement time:   Site:   Days:   Peripheral IV 07/01/18 Left Hand   07/01/18    0814    Hand   less than 1   Peripheral IV 07/01/18 Left;Upper Forearm   07/01/18    0837    Forearm   less than 1          Labs/Imaging Results for orders placed or performed during the hospital encounter of 07/01/18 (from the past 48  hour(s))  Blood gas, venous     Status: Abnormal   Collection Time: 07/01/18  8:34 AM  Result Value Ref Range   pH, Ven 7.495 (H) 7.250 - 7.430   pCO2, Ven 32.6 (L) 44.0 - 60.0 mmHg   pO2, Ven 53.8 (H) 32.0 - 45.0 mmHg   Bicarbonate 24.9 20.0 - 28.0 mmol/L   Acid-Base Excess 2.5 (H) 0.0 - 2.0 mmol/L   O2 Saturation 89.5 %   Patient temperature 98.6    Collection site VENOUS    Drawn by DRAWN BY RN    Sample type VENOUS     Comment: Performed at [facility], [place] [address] [place], [name] [address]  Protime-INR (order if Patient is taking Coumadin / Warfarin)     Status: Abnormal   Collection Time: 07/01/18  8:36 AM  Result Value Ref Range   Prothrombin Time  17.2 (H) 11.4 - 15.2 seconds   INR 1.42     Comment: Performed at [facility], [place] [address] [place], [place] [address]  Troponin I - ONCE - STAT     Status: None   Collection Time: 07/01/18  8:36 AM  Result Value Ref Range   Troponin I <0.03 <0.03 ng/mL    Comment: Performed at [facility], [place] [address] [place], [place] [address]  CBC with Differential/Platelet     Status: Abnormal   Collection Time: 07/01/18  8:36 AM  Result Value Ref Range   WBC 12.4 (H) 4.0 - 10.5 K/uL   RBC 4.41 3.87 - 5.11 MIL/uL   Hemoglobin 13.5 12.0 - 15.0 g/dL   HCT 42.7 36.0 - 46.0 %   MCV 96.8 80.0 - 100.0 fL   MCH 30.6 26.0 - 34.0 pg   MCHC 31.6 30.0 - 36.0 g/dL   RDW 16.5 (H) 11.5 - 15.5 %   Platelets 346 150 - 400 K/uL   nRBC 0.0 0.0 - 0.2 %   Neutrophils Relative % 73 %   Neutro Abs 9.1 (H) 1.7 - 7.7 K/uL   Lymphocytes Relative 19 %   Lymphs Abs 2.4 0.7 - 4.0 K/uL   Monocytes Relative 5 %   Monocytes Absolute 0.6 0.1 - 1.0 K/uL   Eosinophils Relative 2 %   Eosinophils Absolute 0.2 0.0 - 0.5 K/uL   Basophils Relative 0 %   Basophils Absolute 0.1 0.0 - 0.1 K/uL   Immature Granulocytes 1 %   Abs Immature Granulocytes 0.08 (H) 0.00 - 0.07 K/uL    Comment: Performed at [facility], [place] [address], [place], [place] [address]  Comprehensive metabolic panel     Status: Abnormal   Collection Time: 07/01/18  8:36 AM  Result Value Ref Range   Sodium 140 135 - 145 mmol/L   Potassium 3.0 (L) 3.5 - 5.1 mmol/L   Chloride 104 98 - 111 mmol/L   CO2 23 22 - 32 mmol/L   Glucose, Bld 139 (H) 70 - 99 mg/dL   BUN 22 8 - 23 mg/dL   Creatinine, Ser 1.00 0.44 - 1.00 mg/dL   Calcium 9.0 8.9 - 10.3 mg/dL   Total Protein 7.9 6.5 - 8.1 g/dL   Albumin 3.9 3.5 - 5.0 g/dL   AST 25 15 - 41 U/L   ALT 29 0 - 44 U/L   Alkaline Phosphatase 87 38 - 126 U/L   Total Bilirubin 0.8 0.3 - 1.2 mg/dL   GFR calc non Af Amer 60 (L) >60  mL/min   GFR calc Af Amer >60  >60 mL/min   Anion gap 13 5 - 15    Comment: Performed at [facility], [place] [address], [place], [place] [address]  Brain natriuretic peptide     Status: None   Collection Time: 07/01/18  8:36 AM  Result Value Ref Range   B Natriuretic Peptide 22.4 0.0 - 100.0 pg/mL    Comment: Performed at [facility], [place] [address], [place], [place] [address]  Blood gas, arterial     Status: Abnormal   Collection Time: 07/01/18 11:10 AM  Result Value Ref Range   FIO2 30.00    Delivery systems BILEVEL POSITIVE AIRWAY PRESSURE    Inspiratory PAP 10.0    Expiratory PAP 5.0    pH, Arterial 7.460 (H) 7.350 - 7.450   pCO2 arterial 34.3 32.0 - 48.0 mmHg   pO2, Arterial 86.3 83.0 - 108.0 mmHg   Bicarbonate 24.1 20.0 - 28.0 mmol/L   Acid-Base Excess 1.2 0.0 - 2.0 mmol/L   O2 Saturation 96.5 %   Patient temperature 98.6    Collection site RIGHT RADIAL    Drawn by [phone]    Sample type ARTERIAL DRAW    Allens test (pass/fail) PASS PASS    Comment: Performed at [facility], [place] [address], [place], [place] [address]   Dg Chest Port 1 View  Result Date: 07/01/2018 CLINICAL DATA:  Shortness of breath. EXAM: PORTABLE CHEST 1 VIEW COMPARISON:  04/01/2015 FINDINGS: The heart size and mediastinal contours are within normal limits. Lungs show bibasilar opacities with volume loss and stable elevation of the right hemidiaphragm. While this likely represents atelectasis, a component of pneumonia cannot be excluded at either lung base. No overt pulmonary edema, pleural effusion or pneumothorax identified. The visualized skeletal structures are unremarkable. IMPRESSION: Bibasilar opacities with volume loss. This likely represents atelectasis. Component of pneumonia cannot be excluded. Electronically Signed   By: Aletta Edouard M.D.   On: 07/01/2018 10:30   None  Pending Labs Unresulted Labs (From admission, onward)    Start     Ordered   07/02/18 0500  HIV  antibody (Routine Testing)  Tomorrow morning,   R     07/01/18 1138   07/02/18 0998  Basic metabolic panel  Tomorrow morning,   R     07/01/18 1138   07/02/18 0500  CBC  Tomorrow morning,   R     07/01/18 1138   07/02/18 0500  Magnesium  Tomorrow morning,   R     07/01/18 1138          Vitals/Pain Today's Vitals   07/01/18 1039 07/01/18 1045 07/01/18 1100 07/01/18 1145  BP:  (!) 128/112    Pulse: (!) 101 (!) 101 (!) 105 (!) 101  Resp: 17 12 (!) 23 20  Temp:      TempSrc:      SpO2: 99% 100% 95% 96%  Weight:      Height:        Isolation Precautions No active isolations  Medications Medications  albuterol (PROVENTIL) (2.5 MG/3ML) 0.083% nebulizer solution 2.5 mg (2.5 mg Nebulization Given 07/01/18 1039)  ipratropium-albuterol (DUONEB) 0.5-2.5 (3) MG/3ML nebulizer solution 3 mL (has no administration in time range)  potassium chloride 20 MEQ/15ML (10%) solution 40 mEq (has no administration in time range)  budesonide (PULMICORT) nebulizer solution 0.5 mg (has no administration in time range)  ALPRAZolam (XANAX) tablet 0.25 mg (has no administration in  time range)  magnesium sulfate IVPB 2 g 50 mL (0 g Intravenous Stopped 07/01/18 1000)    Mobility Walks with device

## 2018-07-01 NOTE — ED Notes (Signed)
Patient wanted RN to call sister, Silva Bandy @ [phone], to let someone know that patient is in hospital and to update sister on current situation. Patient confirmed with RN that sister, Silva Bandy, can be updated and called about situation. RN has updated sister, Silva Bandy on patient condition.

## 2018-07-01 NOTE — H&P (Signed)
History and Physical  Patient Name: [name] White     ZTI:[id number]    DOB: October 21, 1955    DOA: 07/01/2018 PCP: [name], MD  Patient coming from: Home  Chief Complaint: Dyspnea, chest tightness, tremor, neck discomfort      HPI: [name] White is a 63 y.o. F with hx obesity, HTN, Aflutter on Xarelto, and VCD and asthma who presents with acute dyspnea.  At baseline, the patient has been off maintenance medications for asthma she thinks for about 2 years, using albuterol rescue only once or twice a week, well controlled (although she does report only being able to ambulate ~100 feet before dyspnea stops her and that she uses a motorized chair at the grocery store).  However, in last 2 months, she has been having persistent asthma symptoms.    Before Thanksgiving, she went to [facility] to stay with her daughter.  Got a cold (congestion, runny nose, cough), and subsequently developed persistent cough, dyspnea at rest, and chest tightness/wheezing.   -Treated and released from ER right after Thanksgiving, got prednisone burst. -Stayed in [place] because breathing not back to baseline -Back to ER Dec 20, admitted for 3 days -Improved on solumedrol, bronchodilators, discharged with 12 day prednisone taper -Slow recovery, did return to ER once after discharge, released -Finished prednisone taper 4 days prior to admission, was feeling better, just took train back to [place] last night overnight  This morning, arrived in [place] 6AM.  Was left on the platform in 30 degree weather by [place] disability staff.  Then got to her mother's house (with whom she lives) and no one had left the door open for her or would answer her call.  She called sister and EMS both.  She reports during that time that she was locked out she started to develop dyspnea, chest tightness, a tightness in her throat, as well as dizziness and tingling.  When EMS arrived, she was severely short of breath, felt  chest tightness and was belly breathing.  She was given albuterol and Solu-medrol 125 mg en route and brought in on oxymask.    ED course: -Afebrile, HR 102, RR 27, BP 165/136, SpO2 97% on oxymask -Na 140, K 3.0, Cr 1.0, WBC 12.4K, Hgb 13.5 -BNP 22 -INR 1.4 -CXR showed no consolidation, effusion, edema, only basilar atelectasis, similar to CXR in [place] 2 weeks ago -Troponin negative -She was given continuous albuterol and requested BiPAP for comfort -ABG obtained, showed respiratory alkalosis, pCO2 34, pO2 86         ROS: Review of Systems  Constitutional: Negative for chills, fever and malaise/fatigue.  Respiratory: Positive for shortness of breath, wheezing and stridor. Negative for cough, hemoptysis and sputum production.   Cardiovascular: Negative for chest pain, palpitations, orthopnea, claudication, leg swelling and PND.  Neurological: Positive for dizziness and tingling.  Psychiatric/Behavioral: The patient is not nervous/anxious.   All other systems reviewed and are negative.         Past Medical History:  Diagnosis Date  . Anxiety   . Asthma   . Chronic lower back pain   . Depression   . Family history of anesthesia complication    "daughter; causes her to pass out afterwards"  . GERD (gastroesophageal reflux disease)   . Hypertension   . Hyperthyroidism   . Migraine    "monthly" (12/28/2013)  . Osteoarthritis    "both knees; back of my neck; right pelvic bone" (12/28/2013)  . Paroxysmal A-fib ([place])   .  Pulmonary embolism ([place]) 12/28/2013   "2 clots in each lung"    Past Surgical History:  Procedure Laterality Date  . ABDOMINAL HYSTERECTOMY    . APPENDECTOMY    . BREAST CYST EXCISION Right   . DILATION AND CURETTAGE OF UTERUS    . ELECTROPHYSIOLOGIC STUDY N/A 05/27/2015   Procedure: A-Flutter;  Surgeon: Evans Lance, MD;  Location: [facility];  Service: Cardiovascular;  Laterality: N/A;  . EXCISIONAL HEMORRHOIDECTOMY    . NASAL SINUS SURGERY   2007  . THYROIDECTOMY, PARTIAL Right 2005  . TUBAL LIGATION    . WISDOM TOOTH EXTRACTION      Social History: Patient lives with family.  The patient walks short distances unassisted.  Nonsmoker.  Allergies  Allergen Reactions  . Caffeine Other (See Comments)    Migraine  . Vicodin [Hydrocodone-Acetaminophen] Nausea And Vomiting and Other (See Comments)    Headache   . Hydralazine Hcl     Other reaction(s): Other (See Comments) AKI leading to rhabdo and electrolyte abnormalities  . Hydrocodone-Acetaminophen Nausea And Vomiting  . Ciprofloxacin Hives and Rash  . Erythromycin Hives and Rash  . Lisinopril Cough    Other reaction(s): Cough  . Oxycodone Nausea And Vomiting    headache Other reaction(s): Vomiting  . Sulfamethoxazole-Trimethoprim Rash    Family history: family history includes Asthma in her mother; Breast cancer in her daughter; Heart failure in her mother; Osteoarthritis in her mother.  Prior to Admission medications   Medication Sig Start Date End Date Taking? Authorizing Provider  acetaminophen (TYLENOL) 500 MG tablet Take 500 mg by mouth every 6 (six) hours as needed.   Yes [provider]  albuterol (PROVENTIL HFA;VENTOLIN HFA) 108 (90 Base) MCG/ACT inhaler Inhale 2 puffs into the lungs every 6 (six) hours as needed for wheezing or shortness of breath. 11/29/17  Yes Lockamy, Timothy, DO  cycloSPORINE (RESTASIS) 0.05 % ophthalmic emulsion Place 1 drop into both eyes 2 (two) times daily.   Yes [provider]  diclofenac sodium (VOLTAREN) 1 % GEL APPLY 2 GRAMS EXTERNALLY TO THE AFFECTED AREA FOUR TIMES DAILY Patient taking differently: Apply 2 g topically 4 (four) times daily.  04/30/18  Yes [name], MD  diltiazem (CARDIZEM CD) 120 MG 24 hr capsule Take 1 capsule (120 mg total) by mouth daily. Please keep upcoming appt in January before anymore refills. Final Attempt 06/13/18  Yes Evans Lance, MD  flecainide (TAMBOCOR) 100 MG tablet Take 1  tablet (100 mg total) by mouth 2 (two) times daily. Please keep upcoming appt in January before anymore refills. Final Attempt 06/13/18  Yes Evans Lance, MD  hydrOXYzine (ATARAX/VISTARIL) 25 MG tablet Take 25 mg by mouth 2 (two) times daily as needed for anxiety.   Yes [provider]  loratadine (CLARITIN) 10 MG tablet Take 10 mg by mouth daily.   Yes [provider]  meclizine (ANTIVERT) 12.5 MG tablet Take 1 tablet (12.5 mg total) by mouth 3 (three) times daily as needed for dizziness. 02/09/18  Yes [name], MD  rivaroxaban (XARELTO) 20 MG TABS tablet TAKE 1 TABLET(20 MG) BY MOUTH DAILY AFTER SUPPER 06/13/18  Yes Evans Lance, MD  venlafaxine XR (EFFEXOR-XR) 150 MG 24 hr capsule Take 150 mg by mouth daily with breakfast.   Yes [provider]  potassium chloride SA (K-DUR,KLOR-CON) 20 MEQ tablet Take 2 tablets (40 mEq total) by mouth daily. Patient not taking: Reported on 07/01/2018 07/29/16   Evans Lance,  MD       Physical Exam: BP (!) 128/112   Pulse (!) 101   Temp (!) 97.4 F (36.3 C) (Axillary)   Resp 20   Ht 5\' 5"  (1.651 m)   Wt 132.5 kg   SpO2 96%   BMI 48.59 kg/m  General appearance: Well-developed, obese adult female, alert and in no acute distress.  Initially on BiPAP, during interview, taken to room air and appeared comfortable.   Eyes: Anicteric, conjunctiva pink, lids and lashes normal. PERRL.    ENT: No nasal deformity, discharge, epistaxis.  Hearing normal. OP moist without lesions.   Skin: Warm and dry.  No suspicious rashes or lesions. Cardiac: Tachcyardic, regular, nl S1-S2, no murmurs appreciated.  Capillary refill is brisk.  JVP not visible.  No LE edema.  Radial pulses 2+ and symmetric. Respiratory: Tachypneic, using accessory muscles.  Poor shallow inspiratory effort, but no wheezing nor rales.   Abdomen: Abdomen soft.  No TTP. No ascites, distension, hepatosplenomegaly.   MSK: No deformities or effusions of the large  joints of the upper or lower extremities bilaterally.  No cyanosis or clubbing. Neuro: Cranial nerves 3-12 intact.  Sensation intact to light touch. Speech is fluent.  Muscle strength normal.    Psych: Sensorium intact and responding to questions, attention normal.  Behavior appropriate.  Affect anxious.  Judgment and insight appear normal.     Labs on Admission:  I have personally reviewed following labs and imaging studies: CBC: Recent Labs  Lab 07/01/18 0836  WBC 12.4*  NEUTROABS 9.1*  HGB 13.5  HCT 42.7  MCV 96.8  PLT 601   Basic Metabolic Panel: Recent Labs  Lab 07/01/18 0836  NA 140  K 3.0*  CL 104  CO2 23  GLUCOSE 139*  BUN 22  CREATININE 1.00  CALCIUM 9.0   GFR: Estimated Creatinine Clearance: 79.3 mL/min (by C-G formula based on SCr of 1 mg/dL).  Liver Function Tests: Recent Labs  Lab 07/01/18 0836  AST 25  ALT 29  ALKPHOS 87  BILITOT 0.8  PROT 7.9  ALBUMIN 3.9   Coagulation Profile: Recent Labs  Lab 07/01/18 0836  INR 1.42   Cardiac Enzymes: Recent Labs  Lab 07/01/18 0836  TROPONINI <0.03          Radiological Exams on Admission: Personally reviewed CXR shows obese habitus, no consolidation or edema: Dg Chest Port 1 View  Result Date: 07/01/2018 CLINICAL DATA:  Shortness of breath. EXAM: PORTABLE CHEST 1 VIEW COMPARISON:  04/01/2015 FINDINGS: The heart size and mediastinal contours are within normal limits. Lungs show bibasilar opacities with volume loss and stable elevation of the right hemidiaphragm. While this likely represents atelectasis, a component of pneumonia cannot be excluded at either lung base. No overt pulmonary edema, pleural effusion or pneumothorax identified. The visualized skeletal structures are unremarkable. IMPRESSION: Bibasilar opacities with volume loss. This likely represents atelectasis. Component of pneumonia cannot be excluded. Electronically Signed   By: Aletta Edouard M.D.   On: 07/01/2018 10:30            Assessment/Plan  Dyspnea  I suspect this is vocal cord dysfunction in setting of being locked out of home.  No recorded hypoxia here, now stable on room air.     Last Pulm note was from 2015 and her PFTs were normal, she was thought to have VCD not asthma and her inhalers were stopped.  She has just had 2 weeks prednisone, and I would prefer to avoid systemic steroids.  I have a very low suspicion for PE given her prolonged course over 2 months and provoked onset by stress this morning.  -Observe overnight -Start Pulmicort nebs -Schedule Duo-nebs -PRN albuterol -Ambulate without O2 tomorrow, if normal, discharge home with steroid maintenance inhaler (or Symbicort which she states she was given a rx for in [place]), if abnormal consult to Pulmonology -She has Pulm follow up with Dr. Lamonte Sakai Friday  Hypokalemia  -Supplement K -Check Mag  Leukocytosis  From recent steroids, no clinical symptoms of pneumonia, CXR not consistent.  Atrial flutter, atypical  Followed by Dr. Lovena Le -Continue home Xarelto, flecainide and diltiazem  Depression  -Continue home venlafaxine -Trial low dose Xanax in hospital for further respiratory attacks         DVT prophylaxis: N/A on Xarelto  Code Status: FULL  Family Communication: None  Disposition Plan: Anticipate observation ovenright, nebs, likely home tomorrow Consults called: None Admission status: OBS   At the point of initial evaluation, it is my clinical opinion that admission for OBSERVATION is reasonable and necessary because the patient's presenting complaints in the context of their chronic conditions represent sufficient risk of deterioration or significant morbidity to constitute reasonable grounds for close observation in the hospital setting, but that the patient may be medically stable for discharge from the hospital within 24 to 48 hours.    Medical decision making: Patient seen at 11:02 AM on 07/01/2018.   The patient was discussed with Dr. Ralene Bathe.  What exists of the patient's chart was reviewed in depth and summarized above.  Clinical condition: stable.        [facility] Pager: please page via [email or web address], enter password "[email or web address]", and identify attending under [facility]

## 2018-07-01 NOTE — Progress Notes (Signed)
Patient arrived to unit with ED transporter.  Patient oriented to unit and equipment.  Patient placed on telemetry, verified with CMT.  Plan of care discussed with Dr. Loleta Books via telephone; MD aware of patient's BP.  Patient's home medications administered. Patient stable, no signs of respiratory distress at this time.  Comfortable in bed, waiting for lunch.  Will continue to monitor.

## 2018-07-01 NOTE — ED Notes (Signed)
Hospitalist at bedside with patient.

## 2018-07-01 NOTE — ED Notes (Signed)
ED Provider at bedside. 

## 2018-07-01 NOTE — ED Provider Notes (Signed)
[facility] DEPT Provider Note   CSN: [id number] Arrival date & time: 07/01/18  0753     History   Chief Complaint Chief Complaint  Patient presents with  . Shortness of Breath  . Wheezing    HPI [name] White is a 63 y.o. female.  The history is provided by the patient, the EMS personnel and medical records. No language interpreter was used.  Shortness of Breath  Associated symptoms include wheezing.  Wheezing     [name] White is a 63 y.o. female who presents to the Emergency Department complaining of sob. She presents to the emergency department complaining of shortness of breath. Level V caveat due to respiratory distress. History is provided by the patient and EMS. She developed acute onset shortness of breath and wheezing about 7 AM. On EMS arrival she was noted to be in respiratory distress, minimally verbal. She was given 15 mg of albuterol, .5 of Atrovent, 125 of Solu-Medrol. EMS reports some improvement in her air movement bilaterally with medications. She recently visited her daughter in [place] it was admitted when she was there for bronchitis and asthma exacerbation. She denies any fevers but she does have a squeezing chest pain. She brings with her discharge papers from her hospitalization. She was admitted on June 12, 2018 and discharged on December 20. She was discharged home on doxycycline and prednisone as well as change in her Cardizem dosing. She is on chronic anticoagulation for atrial fibrillation and takes Xarelto. She was also admitted on November 28 and December 22. She does have a history of PE. Past Medical History:  Diagnosis Date  . Anxiety   . Asthma   . Chronic lower back pain   . Depression   . Family history of anesthesia complication    "daughter; causes her to pass out afterwards"  . GERD (gastroesophageal reflux disease)   . Hypertension   . Hyperthyroidism   . Migraine    "monthly" (12/28/2013)  .  Osteoarthritis    "both knees; back of my neck; right pelvic bone" (12/28/2013)  . Paroxysmal A-fib ([place])   . Pulmonary embolism ([place]) 12/28/2013   "2 clots in each lung"    Patient Active Problem List   Diagnosis Date Noted  . Asthma exacerbation 07/01/2018  . Osteoarthritis 03/02/2018  . Viral illness 02/13/2018  . Nausea 02/13/2018  . Left medial tibial stress syndrome 12/26/2017  . Hematoma 11/27/2017  . Severe episode of recurrent major depressive disorder, without psychotic features ([place])   . MDD (major depressive disorder), recurrent episode, severe ([place]) 09/07/2015  . Atrial flutter ([place]) 07/29/2015  . History of atrial flutter 06/26/2015  . Chest pain 03/22/2015  . Asthma 11/12/2014  . History of hypertension 11/05/2014  . Atrial fibrillation, chronic 11/03/2014  . Chronic anticoagulation 11/03/2014  . History of pulmonary embolus (PE) 11/03/2014  . Hypokalemia 11/03/2014  . Non-traumatic rhabdomyolysis 11/03/2014  . Major depressive disorder 11/03/2014  . SOB (shortness of breath)   . MDD (major depressive disorder), recurrent severe, without psychosis ([place]) 03/05/2014  . Pulmonary embolism ([place]) 12/27/2013  . Vocal cord dysfunction 11/12/2013  . Mixed headache 01/06/2012  . OVERACTIVE BLADDER 04/18/2008  . Osteoarthritis of both knees 04/18/2008  . POLYARTHRITIS 09/14/2007  . Morbid obesity ([place]) 08/24/2006  . RESTLESS LEGS SYNDROME 08/24/2006  . HYPERTENSION, BENIGN SYSTEMIC 08/24/2006  . RHINITIS, ALLERGIC 08/24/2006  . Uncomplicated asthma 13/24/4010  . GASTROESOPHAGEAL REFLUX, NO ESOPHAGITIS 08/24/2006    Past Surgical History:  Procedure  Laterality Date  . ABDOMINAL HYSTERECTOMY    . APPENDECTOMY    . BREAST CYST EXCISION Right   . DILATION AND CURETTAGE OF UTERUS    . ELECTROPHYSIOLOGIC STUDY N/A 05/27/2015   Procedure: A-Flutter;  Surgeon: Evans Lance, MD;  Location: [facility];  Service: Cardiovascular;  Laterality: N/A;  . EXCISIONAL  HEMORRHOIDECTOMY    . NASAL SINUS SURGERY  2007  . THYROIDECTOMY, PARTIAL Right 2005  . TUBAL LIGATION    . WISDOM TOOTH EXTRACTION       OB History   No obstetric history on file.      Home Medications    Prior to Admission medications   Medication Sig Start Date End Date Taking? Authorizing Provider  acetaminophen (TYLENOL) 500 MG tablet Take 500 mg by mouth every 6 (six) hours as needed.   Yes [provider]  albuterol (PROVENTIL HFA;VENTOLIN HFA) 108 (90 Base) MCG/ACT inhaler Inhale 2 puffs into the lungs every 6 (six) hours as needed for wheezing or shortness of breath. 11/29/17  Yes Lockamy, Timothy, DO  cycloSPORINE (RESTASIS) 0.05 % ophthalmic emulsion Place 1 drop into both eyes 2 (two) times daily.   Yes [provider]  diclofenac sodium (VOLTAREN) 1 % GEL APPLY 2 GRAMS EXTERNALLY TO THE AFFECTED AREA FOUR TIMES DAILY Patient taking differently: Apply 2 g topically 4 (four) times daily.  04/30/18  Yes [name], MD  diltiazem (CARDIZEM CD) 120 MG 24 hr capsule Take 1 capsule (120 mg total) by mouth daily. Please keep upcoming appt in January before anymore refills. Final Attempt 06/13/18  Yes Evans Lance, MD  flecainide (TAMBOCOR) 100 MG tablet Take 1 tablet (100 mg total) by mouth 2 (two) times daily. Please keep upcoming appt in January before anymore refills. Final Attempt 06/13/18  Yes Evans Lance, MD  hydrOXYzine (ATARAX/VISTARIL) 25 MG tablet Take 25 mg by mouth 2 (two) times daily as needed for anxiety.   Yes [provider]  loratadine (CLARITIN) 10 MG tablet Take 10 mg by mouth daily.   Yes [provider]  meclizine (ANTIVERT) 12.5 MG tablet Take 1 tablet (12.5 mg total) by mouth 3 (three) times daily as needed for dizziness. 02/09/18  Yes [name], MD  rivaroxaban (XARELTO) 20 MG TABS tablet TAKE 1 TABLET(20 MG) BY MOUTH DAILY AFTER SUPPER 06/13/18  Yes Evans Lance, MD  venlafaxine XR (EFFEXOR-XR) 150 MG 24 hr  capsule Take 150 mg by mouth daily with breakfast.   Yes [provider]  potassium chloride SA (K-DUR,KLOR-CON) 20 MEQ tablet Take 2 tablets (40 mEq total) by mouth daily. Patient not taking: Reported on 07/01/2018 07/29/16   Evans Lance, MD    Family History Family History  Problem Relation Age of Onset  . Osteoarthritis Mother   . Asthma Mother   . Heart failure Mother   . Breast cancer Daughter     Social History Social History   Tobacco Use  . Smoking status: Former Smoker    Packs/day: 0.25    Years: 20.00    Pack years: 5.00    Types: Cigarettes    Last attempt to quit: 07/17/1999    Years since quitting: 18.9  . Smokeless tobacco: Never Used  Substance Use Topics  . Alcohol use: Not Currently    Comment: "drank some in my 30's"  . Drug use: No     Allergies   Caffeine; Vicodin [hydrocodone-acetaminophen]; Hydralazine hcl; Hydrocodone-acetaminophen; Ciprofloxacin; Erythromycin; Lisinopril; Oxycodone; and  Sulfamethoxazole-trimethoprim   Review of Systems Review of Systems  Unable to perform ROS: Severe respiratory distress  Respiratory: Positive for shortness of breath and wheezing.      Physical Exam Updated Vital Signs BP (!) 129/99 (BP Location: Right Arm)   Pulse (!) 107   Temp 98 F (36.7 C) (Oral)   Resp (!) 24   Ht 5\' 5"  (1.651 m)   Wt 132.5 kg   SpO2 96%   BMI 48.59 kg/m   Physical Exam Vitals signs and nursing note reviewed.  Constitutional:      Appearance: She is well-developed.  HENT:     Head: Normocephalic and atraumatic.  Cardiovascular:     Heart sounds: No murmur.     Comments: tachycardic Pulmonary:     Effort: Respiratory distress present.     Comments: Tachypneic, decreased air movement bilaterally with faint wheezes in bases.  Minimally verbal.   Abdominal:     Palpations: Abdomen is soft.     Tenderness: There is no abdominal tenderness. There is no guarding or rebound.  Musculoskeletal:        General: No  tenderness.  Skin:    General: Skin is warm and dry.     Capillary Refill: Capillary refill takes less than 2 seconds.  Neurological:     Mental Status: She is alert.     Comments: Awake and alert, follows commands, MAE symmetrically  Psychiatric:        Mood and Affect: Mood normal.        Behavior: Behavior normal.      ED Treatments / Results  Labs (all labs ordered are listed, but only abnormal results are displayed) Labs Reviewed  PROTIME-INR - Abnormal; Notable for the following components:      Result Value   Prothrombin Time 17.2 (*)    All other components within normal limits  CBC WITH DIFFERENTIAL/PLATELET - Abnormal; Notable for the following components:   WBC 12.4 (*)    RDW 16.5 (*)    Neutro Abs 9.1 (*)    Abs Immature Granulocytes 0.08 (*)    All other components within normal limits  COMPREHENSIVE METABOLIC PANEL - Abnormal; Notable for the following components:   Potassium 3.0 (*)    Glucose, Bld 139 (*)    GFR calc non Af Amer 60 (*)    All other components within normal limits  BLOOD GAS, VENOUS - Abnormal; Notable for the following components:   pH, Ven 7.495 (*)    pCO2, Ven 32.6 (*)    pO2, Ven 53.8 (*)    Acid-Base Excess 2.5 (*)    All other components within normal limits  BLOOD GAS, ARTERIAL - Abnormal; Notable for the following components:   pH, Arterial 7.460 (*)    All other components within normal limits  MRSA PCR SCREENING  TROPONIN I  BRAIN NATRIURETIC PEPTIDE    EKG None  Radiology Dg Chest Port 1 View  Result Date: 07/01/2018 CLINICAL DATA:  Shortness of breath. EXAM: PORTABLE CHEST 1 VIEW COMPARISON:  04/01/2015 FINDINGS: The heart size and mediastinal contours are within normal limits. Lungs show bibasilar opacities with volume loss and stable elevation of the right hemidiaphragm. While this likely represents atelectasis, a component of pneumonia cannot be excluded at either lung base. No overt pulmonary edema, pleural effusion  or pneumothorax identified. The visualized skeletal structures are unremarkable. IMPRESSION: Bibasilar opacities with volume loss. This likely represents atelectasis. Component of pneumonia cannot be excluded. Electronically Signed  By: Aletta Edouard M.D.   On: 07/01/2018 10:30    Procedures Procedures (including critical care time) CRITICAL CARE Performed by: Quintella Reichert   Total critical care time: 35 minutes  Critical care time was exclusive of separately billable procedures and treating other patients.  Critical care was necessary to treat or prevent imminent or life-threatening deterioration.  Critical care was time spent personally by me on the following activities: development of treatment plan with patient and/or surrogate as well as nursing, discussions with consultants, evaluation of patient's response to treatment, examination of patient, obtaining history from patient or surrogate, ordering and performing treatments and interventions, ordering and review of laboratory studies, ordering and review of radiographic studies, pulse oximetry and re-evaluation of patient's condition.  Medications Ordered in ED Medications  albuterol (PROVENTIL) (2.5 MG/3ML) 0.083% nebulizer solution 2.5 mg (2.5 mg Nebulization Given 07/01/18 1039)  ipratropium-albuterol (DUONEB) 0.5-2.5 (3) MG/3ML nebulizer solution 3 mL (3 mLs Nebulization Given 07/01/18 1300)  potassium chloride 20 MEQ/15ML (10%) solution 40 mEq (has no administration in time range)  budesonide (PULMICORT) nebulizer solution 0.5 mg (0.5 mg Nebulization Given 07/01/18 1305)  ALPRAZolam (XANAX) tablet 0.25 mg (0.25 mg Oral Given 07/01/18 1238)  diltiazem (CARDIZEM CD) 24 hr capsule 120 mg (120 mg Oral Given 07/01/18 1238)  flecainide (TAMBOCOR) tablet 100 mg (100 mg Oral Given 07/01/18 1238)  venlafaxine XR (EFFEXOR-XR) 24 hr capsule 150 mg (150 mg Oral Given 07/01/18 1238)  rivaroxaban (XARELTO) tablet 20 mg (has no administration in time  range)  loratadine (CLARITIN) tablet 10 mg (10 mg Oral Given 07/01/18 1238)  diclofenac sodium (VOLTAREN) 1 % transdermal gel 2 g (has no administration in time range)  cycloSPORINE (RESTASIS) 0.05 % ophthalmic emulsion 1 drop (1 drop Both Eyes Not Given 07/01/18 1238)  magnesium sulfate IVPB 2 g 50 mL (0 g Intravenous Stopped 07/01/18 1000)     Initial Impression / Assessment and Plan / ED Course  I have reviewed the triage vital signs and the nursing notes.  Pertinent labs & imaging results that were available during my care of the patient were reviewed by me and considered in my medical decision making (see chart for details).     Patient with history of COPD here for evaluation of shortness of breath. Patient in respiratory distress on ED arrival with tachypnea, speaks and few words, accessory muscle use. She was treated with albuterol, magnesium and started on BiPAP. On repeat assessment following BiPAP she is significantly improved with normal work of breathing. Attempted to discontinue the BiPAP and she had recurrent tachypnea and respiratory distress. BiPAP was replaced. Medicine consulted for admission for COPD exacerbation. Presentation is not consistent with pneumonia, CHF, PE.  Final Clinical Impressions(s) / ED Diagnoses   Final diagnoses:  None    ED Discharge Orders    None       Quintella Reichert, MD 07/01/18 1507

## 2018-07-01 NOTE — ED Notes (Signed)
Patient requested to have Bi-Pap put back on. RN has paged RT and they are in route. RN will put patient back on Bi-Pap.

## 2018-07-01 NOTE — ED Notes (Signed)
Bed: WA08 Expected date:  Expected time:  Means of arrival:  Comments: resp problems

## 2018-07-01 NOTE — ED Triage Notes (Signed)
Per EMS: Pt is coming from home with a c/o SOB.  Pt reports this reportedly started around 0700 this morning.  EMS gave her 15mg  of albuterol, 0.5mg  of atrovent, and 125mg  solumedrol.  Pt has audible expiratory wheezing.

## 2018-07-02 DIAGNOSIS — J45901 Unspecified asthma with (acute) exacerbation: Secondary | ICD-10-CM | POA: Diagnosis not present

## 2018-07-02 LAB — BASIC METABOLIC PANEL
Anion gap: 9 (ref 5–15)
BUN: 18 mg/dL (ref 8–23)
CALCIUM: 8.6 mg/dL — AB (ref 8.9–10.3)
CO2: 24 mmol/L (ref 22–32)
CREATININE: 0.65 mg/dL (ref 0.44–1.00)
Chloride: 106 mmol/L (ref 98–111)
GFR calc Af Amer: >60 mL/min (ref >60)
GFR calc non Af Amer: >60 mL/min (ref >60)
Glucose, Bld: 137 mg/dL — ABNORMAL HIGH (ref 70–99)
Potassium: 4.2 mmol/L (ref 3.5–5.1)
Sodium: 139 mmol/L (ref 135–145)

## 2018-07-02 LAB — CBC
HCT: 39.3 % (ref 36.0–46.0)
Hemoglobin: 12.2 g/dL (ref 12.0–15.0)
MCH: 30.8 pg (ref 26.0–34.0)
MCHC: 31 g/dL (ref 30.0–36.0)
MCV: 99.2 fL (ref 80.0–100.0)
PLATELETS: 311 10*3/uL (ref 150–400)
RBC: 3.96 MIL/uL (ref 3.87–5.11)
RDW: 16.7 % — ABNORMAL HIGH (ref 11.5–15.5)
WBC: 12.4 10*3/uL — ABNORMAL HIGH (ref 4.0–10.5)
nRBC: 0 % (ref 0.0–0.2)

## 2018-07-02 LAB — MAGNESIUM: Magnesium: 2.6 mg/dL — ABNORMAL HIGH (ref 1.7–2.4)

## 2018-07-02 LAB — HIV ANTIBODY (ROUTINE TESTING W REFLEX): HIV Screen 4th Generation wRfx: NONREACTIVE

## 2018-07-02 MED ORDER — ACETAMINOPHEN 500 MG PO TABS
1000.0000 mg | ORAL_TABLET | Freq: Once | ORAL | Status: AC
  Administered 2018-07-02: 1000 mg via ORAL
  Filled 2018-07-02: qty 2

## 2018-07-02 MED ORDER — PREDNISONE 20 MG PO TABS
40.0000 mg | ORAL_TABLET | Freq: Every day | ORAL | Status: DC
  Administered 2018-07-03: 40 mg via ORAL
  Filled 2018-07-02: qty 2

## 2018-07-02 NOTE — Progress Notes (Signed)
   07/02/18 1427  Clinical Encounter Type  Visited With Patient  Visit Type Initial;Other (Comment) (AD)  Referral From Nurse  Consult/Referral To Chaplain  The chaplain responded to the consult for Pt. AD.  The chaplain attempted Pt. visit.  The Pt was engaged in  important insurance phone conversations both times the chaplain visited.  The chaplain left the AD brochure with the Pt. The Pt. acknowledged the chaplain attempted visit with a smile. The chaplain will F/U with spiritual care.

## 2018-07-02 NOTE — Progress Notes (Signed)
SATURATION QUALIFICATIONS: (This note is used to comply with regulatory documentation for home oxygen)  Patient Saturations on Room Air at Rest = 95%  Patient Saturations on Room Air while Ambulating = 84%  Patient Saturations on 2 Liters of oxygen while Ambulating = 97%  Please briefly explain why patient needs home oxygen: to maintain O2 sats >88%

## 2018-07-02 NOTE — Care Management Obs Status (Signed)
[facility] NOTIFICATION   Patient Details  Name: VENDA DICE MRN: [id number] Date of Birth: 1956-04-03   Medicare Observation Status Notification Given:  Yes    Purcell Mouton, RN 07/02/2018, 12:46 PM

## 2018-07-02 NOTE — Care Management Obs Status (Signed)
[facility] NOTIFICATION   Patient Details  Name: [name] White MRN: [id number] Date of Birth: [birth year]   Medicare Observation Status Notification Given:  Yes    Purcell Mouton, RN 07/02/2018, 12:47 PM

## 2018-07-02 NOTE — Progress Notes (Signed)
Spoke with pt concerning HH need and DME. Pt plan to discharge to [place] with her daughter. Pt's O2 from [place] related to her travel from [place] to [place].

## 2018-07-02 NOTE — Progress Notes (Addendum)
   [facility] PROGRESS NOTE  [name] White GYJ:[id number] DOB: 1956-02-22 DOA: 07/01/2018 PCP: [name], MD   Narrative: 63 year old female Known prior episodic MDD, bipolar, suicidality 2010, morbid obesity Atrial flutter + recurrent PVCs PACs despite flecainide and was on cardiac monitor back in 2018 under Dr. Beatrix Fetters had radiofrequency catheter ablation 05/27/2015 by him Prior history of probable emboli 12/2013 Previously followed by Dr. Lamonte Sakai for asthma since 2006 last seen in 2015-at that time it was more of upper airway irritation syndrome Partial thyroidectomy due to benign goiter by Dr. Milbert Coulter Sinus surgery Dr. Wilburn Cornelia in the past  Visiting [place] hospitalized there for 3 days given inhalers over the holidays then situational circumstances [place] and she came in with either a panic attack or acute asthma exacerbation  A & Plan Acute asthma exacerbation-mild wheeze-BNP 22 ruling out heart failure Hypoxic respiratory failure on admission -in the ED given albuterol Atrovent Solu-Medrol and was on BiPAP initially using few words and accessory muscles-she seems much better and has ambulated in the hallways but did desat and qualifies for oxygen have asked the nurse to repeat the desat screen--starting steroids 40 mg prednisone expect will do well Atrial flutter plus PVCs on flecainide Eliquis- Stable at this time keep on monitors-magnesium is 2.6 OSA, morbid obesity with restrictive habitus Stable at this time needs significant weight loss continue BiPAP at night Questionable history of pulmonary emboli in the past Is on Eliquis Hypokalemia on admission Corrected to 4.2-discontinue supplementation  Observation criteria over another 24 hours, awaiting further improvement: Present Discharging to [place]   Lon Klippel, MD  [facility] Direct contact: [phone] --Via amion app OR  --[email or web address];  password [email or web address]  7PM-7AM contact night coverage as above 07/02/2018, 2:17 PM  LOS: 0 days    Interval history/Subjective: Awake pleasant no distress feels short winded after walking Usually is able to ambulate all over the place according to her No fever no chills No nausea no vomiting no other constitutional symptoms of dark stool tarry stool etc.  Objective:  Vitals:  Vitals:   07/02/18 0508 07/02/18 0908  BP: (!) 152/82   Pulse: 73   Resp: 20   Temp: 98.2 F (36.8 C)   SpO2: 92% 92%    Exam:  Obese alert Chest clear no rales no rhonchi no wheeze Abdomen obese cannot appreciate organomegaly Lower extremities are soft S1-S2 no murmur  I have personally reviewed the following:  DATA   Labs:  Magnesium 2.6   Scheduled Meds: . budesonide (PULMICORT) nebulizer solution  0.5 mg Nebulization BID  . cycloSPORINE  1 drop Both Eyes BID  . diclofenac sodium  2 g Topical QID  . diltiazem  120 mg Oral Daily  . flecainide  100 mg Oral BID  . ipratropium-albuterol  3 mL Nebulization TID  . loratadine  10 mg Oral Daily  . [START ON 07/03/2018] predniSONE  40 mg Oral QAC breakfast  . rivaroxaban  20 mg Oral Q supper  . venlafaxine XR  150 mg Oral Daily   Continuous Infusions:  Principal Problem:   Asthma exacerbation Active Problems:   Morbid obesity ([place])   HYPERTENSION, BENIGN SYSTEMIC   Vocal cord dysfunction   Atrial flutter ([facility])   History of pulmonary embolus ([facility])   Hypokalemia   Major depressive disorder   LOS: 0 days

## 2018-07-03 DIAGNOSIS — J45901 Unspecified asthma with (acute) exacerbation: Secondary | ICD-10-CM | POA: Diagnosis not present

## 2018-07-03 DIAGNOSIS — J45909 Unspecified asthma, uncomplicated: Secondary | ICD-10-CM | POA: Diagnosis not present

## 2018-07-03 MED ORDER — FLUTICASONE PROPIONATE 50 MCG/ACT NA SUSP: 1.0000 | Freq: Every day | NASAL | 0 refills | Status: DC

## 2018-07-03 MED ORDER — FLUTICASONE PROPIONATE 50 MCG/ACT NA SUSP
1.0000 | Freq: Every day | NASAL | Status: DC
  Administered 2018-07-03: 1 via NASAL
  Filled 2018-07-03: qty 16

## 2018-07-03 MED ORDER — BUDESONIDE-FORMOTEROL FUMARATE 160-4.5 MCG/ACT IN AERO: 2.0000 | INHALATION_SPRAY | Freq: Two times a day (BID) | RESPIRATORY_TRACT | 12 refills | Status: DC

## 2018-07-03 MED ORDER — PREDNISONE 20 MG PO TABS: 40.0000 mg | ORAL_TABLET | Freq: Every day | ORAL | 0 refills | Status: DC

## 2018-07-03 NOTE — Progress Notes (Deleted)
Electrophysiology Office Note Date: 07/03/2018  ID:  [name] White, DOB 07-07-1955, MRN [id number]  PCP: [name], MD Electrophysiologist: Lovena Le  CC: palpitations follow up  [name] White is a 63 y.o. female seen today for Dr Lovena Le.  She presents today for routine electrophysiology followup.  Since last being seen in our clinic, the patient reports doing very well.  She denies chest pain, palpitations, dyspnea, PND, orthopnea, nausea, vomiting, dizziness, syncope, edema, weight gain, or early satiety.  Past Medical History:  Diagnosis Date  . Anxiety   . Asthma   . Chronic lower back pain   . Depression   . Family history of anesthesia complication    "daughter; causes her to pass out afterwards"  . GERD (gastroesophageal reflux disease)   . Hypertension   . Hyperthyroidism   . Migraine    "monthly" (12/28/2013)  . Osteoarthritis    "both knees; back of my neck; right pelvic bone" (12/28/2013)  . Paroxysmal A-fib ([place])   . Pulmonary embolism ([place]) 12/28/2013   "2 clots in each lung"   Past Surgical History:  Procedure Laterality Date  . ABDOMINAL HYSTERECTOMY    . APPENDECTOMY    . BREAST CYST EXCISION Right   . DILATION AND CURETTAGE OF UTERUS    . ELECTROPHYSIOLOGIC STUDY N/A 05/27/2015   Procedure: A-Flutter;  Surgeon: Evans Lance, MD;  Location: [facility];  Service: Cardiovascular;  Laterality: N/A;  . EXCISIONAL HEMORRHOIDECTOMY    . NASAL SINUS SURGERY  2007  . THYROIDECTOMY, PARTIAL Right 2005  . TUBAL LIGATION    . WISDOM TOOTH EXTRACTION      No current facility-administered medications for this visit.    Current Outpatient Medications  Medication Sig Dispense Refill  . budesonide-formoterol (SYMBICORT) 160-4.5 MCG/ACT inhaler Inhale 2 puffs into the lungs 2 (two) times daily. 1 Inhaler 12  . fluticasone (FLONASE) 50 MCG/ACT nasal spray Place 1 spray into both nostrils daily. 1 g 0  . predniSONE (DELTASONE) 20 MG tablet Take 2  tablets (40 mg total) by mouth daily before breakfast. 5 tablet 0   Facility-Administered Medications Ordered in Other Visits  Medication Dose Route Frequency Provider Last Rate Last Dose  . albuterol (PROVENTIL) (2.5 MG/3ML) 0.083% nebulizer solution 2.5 mg  2.5 mg Nebulization Q2H PRN Edwin Dada, MD   2.5 mg at 07/01/18 1039  . ALPRAZolam Duanne Moron) tablet 0.25 mg  0.25 mg Oral BID PRN Edwin Dada, MD   0.25 mg at 07/02/18 2222  . budesonide (PULMICORT) nebulizer solution 0.5 mg  0.5 mg Nebulization BID Edwin Dada, MD   0.5 mg at 07/03/18 0731  . cycloSPORINE (RESTASIS) 0.05 % ophthalmic emulsion 1 drop  1 drop Both Eyes BID Edwin Dada, MD   1 drop at 07/03/18 6226  . diclofenac sodium (VOLTAREN) 1 % transdermal gel 2 g  2 g Topical QID Edwin Dada, MD   2 g at 07/03/18 0925  . diltiazem (CARDIZEM CD) 24 hr capsule 120 mg  120 mg Oral Daily Edwin Dada, MD   120 mg at 07/03/18 0923  . flecainide (TAMBOCOR) tablet 100 mg  100 mg Oral BID Edwin Dada, MD   100 mg at 07/03/18 3335  . fluticasone (FLONASE) 50 MCG/ACT nasal spray 1 spray  1 spray Each Nare Daily Nita Sells, MD   1 spray at 07/03/18 4562  . ipratropium-albuterol (DUONEB) 0.5-2.5 (3) MG/3ML nebulizer solution 3 mL  3 mL  Nebulization TID Edwin Dada, MD   3 mL at 07/03/18 1341  . loratadine (CLARITIN) tablet 10 mg  10 mg Oral Daily Edwin Dada, MD   10 mg at 07/03/18 0923  . predniSONE (DELTASONE) tablet 40 mg  40 mg Oral QAC breakfast Nita Sells, MD   40 mg at 07/03/18 0923  . rivaroxaban (XARELTO) tablet 20 mg  20 mg Oral Q supper Edwin Dada, MD   20 mg at 07/02/18 1722  . venlafaxine XR (EFFEXOR-XR) 24 hr capsule 150 mg  150 mg Oral Daily Edwin Dada, MD   150 mg at 07/03/18 4970    Allergies:   Caffeine; Vicodin [hydrocodone-acetaminophen]; Hydralazine hcl; Hydrocodone-acetaminophen;  Ciprofloxacin; Erythromycin; Lisinopril; Oxycodone; and Sulfamethoxazole-trimethoprim   Social History: Social History   Socioeconomic History  . Marital status: Single    Spouse name: Not on file  . Number of children: Not on file  . Years of education: Not on file  . Highest education level: Not on file  Occupational History  . Not on file  Social Needs  . Financial resource strain: Not on file  . Food insecurity:    Worry: Not on file    Inability: Not on file  . Transportation needs:    Medical: Not on file    Non-medical: Not on file  Tobacco Use  . Smoking status: Former Smoker    Packs/day: 0.25    Years: 20.00    Pack years: 5.00    Types: Cigarettes    Last attempt to quit: 07/17/1999    Years since quitting: 18.9  . Smokeless tobacco: Never Used  Substance and Sexual Activity  . Alcohol use: Not Currently    Comment: "drank some in my 30's"  . Drug use: No  . Sexual activity: Never  Lifestyle  . Physical activity:    Days per week: Not on file    Minutes per session: Not on file  . Stress: Not on file  Relationships  . Social connections:    Talks on phone: Not on file    Gets together: Not on file    Attends religious service: Not on file    Active member of club or organization: Not on file    Attends meetings of clubs or organizations: Not on file    Relationship status: Not on file  . Intimate partner violence:    Fear of current or ex partner: Not on file    Emotionally abused: Not on file    Physically abused: Not on file    Forced sexual activity: Not on file  Other Topics Concern  . Not on file  Social History Narrative  . Not on file    Family History: Family History  Problem Relation Age of Onset  . Osteoarthritis Mother   . Asthma Mother   . Heart failure Mother   . Breast cancer Daughter     Review of Systems: All other systems reviewed and are otherwise negative except as noted above.   Physical Exam: VS:  There were no  vitals taken for this visit. , BMI There is no height or weight on file to calculate BMI. Wt Readings from Last 3 Encounters:  07/01/18 292 lb (132.5 kg)  02/09/18 300 lb (136.1 kg)  12/26/17 (!) 300 lb 9.6 oz (136.4 kg)    GEN- The patient is well appearing, alert and oriented x 3 today.   HEENT: normocephalic, atraumatic; sclera clear, conjunctiva pink; hearing intact; oropharynx  clear; neck supple, no JVP Lymph- no cervical lymphadenopathy Lungs- Clear to ausculation bilaterally, normal work of breathing.  No wheezes, rales, rhonchi Heart- Regular rate and rhythm, no murmurs, rubs or gallops, PMI not laterally displaced GI- soft, non-tender, non-distended, bowel sounds present, no hepatosplenomegaly Extremities- no clubbing, cyanosis, or edema; DP/PT/radial pulses 2+ bilaterally MS- no significant deformity or atrophy Skin- warm and dry, no rash or lesion  Psych- euthymic mood, full affect Neuro- strength and sensation are intact   EKG:  EKG is ordered today.  Recent Labs: 07/01/2018: ALT 29; B Natriuretic Peptide 22.4 07/02/2018: BUN 18; Creatinine, Ser 0.65; Hemoglobin 12.2; Magnesium 2.6; Platelets 311; Potassium 4.2; Sodium 139    Other studies Reviewed: Additional studies/ records that were reviewed today include: Dr Tanna Furry office notes   Assessment and Plan:  ***   Current medicines are reviewed at length with the patient today.   The patient {ACTIONS; HAS/DOES NOT HAVE:19233} concerns regarding her medicines.  The following changes were made today:  {NONE DEFAULTED:18576::"none"}  Labs/ tests ordered today include: *** No orders of the defined types were placed in this encounter.    Disposition:   Follow up with *** {gen number [id number]} {TIME; UNITS DAY/WEEK/MONTH:19136}   Signed, Chanetta Marshall, NP 07/03/2018 2:42 PM   [address] [place] [id number] [phone] (office) [phone] (fax)

## 2018-07-03 NOTE — Progress Notes (Signed)
SATURATION QUALIFICATIONS: (This note is used to comply with regulatory documentation for home oxygen)  Patient Saturations on Room Air at Rest = 92%  Patient Saturations on Room Air while Ambulating = 84%  Patient Saturations on 2 Liters of oxygen while Ambulating = 95%  Please briefly explain why patient needs home oxygen: Pt will require home oxygen supplementation at 2LPM via [place] in order to keep oxygen saturations at a safe level for discharge home.

## 2018-07-03 NOTE — Progress Notes (Signed)
Assumed care of patient at 02:40am. I agree with the pervious assessment and will continue to monitor patient.

## 2018-07-03 NOTE — Progress Notes (Addendum)
Pt discharged home today per Dr. Verlon Au. Pt's IV site D/C'd and WDL. Pt's VSS. Pt provided with home medication list, discharge instructions and prescriptions. Verbalized understanding. Pt instructed to call when her ride home arrives.

## 2018-07-03 NOTE — Discharge Summary (Signed)
Physician Discharge Summary  [name] White YIF:[id number] DOB: August 13, 1955 DOA: 07/01/2018  PCP: [name], MD  Admit date: 07/01/2018 Discharge date: 07/03/2018  Time spent: 25 minutes  Recommendations for Outpatient Follow-up:  1. Recommending outpatient follow-up with pulmonology 2. New prescription Flonase as well as prednisone 40 mg burst completing on 07/07/2018-continue Symbicort that she received from [facility] with hand nebuliser 3. Consider referral back to ENT for her chronic issues 4. Might require further work-up in the outpatient setting for hypoxic respiratory failure including bilevel split-night study as does have a restrictive habitus and has habitus suggestive of OSA  Discharge Diagnoses:  Principal Problem:   Asthma exacerbation Active Problems:   Morbid obesity (HCC)   HYPERTENSION, BENIGN SYSTEMIC   Vocal cord dysfunction   Atrial flutter (HCC)   History of pulmonary embolus (PE)   Hypokalemia   Major depressive disorder   Discharge Condition: Improved  Diet recommendation: Heart healthy low-salt  Filed Weights   07/01/18 0804  Weight: 132.5 kg    History of present illness:  63 year old female Known prior episodic MDD, bipolar, suicidality 2010, morbid obesity Atrial flutter + recurrent PVCs PACs despite flecainide and was on cardiac monitor back in 2018 under Dr. Beatrix Fetters had radiofrequency catheter ablation 05/27/2015 by him Prior history of probable emboli 12/2013 Previously followed by Dr. Lamonte Sakai for asthma since 2006 last seen in 2015-at that time it was more of upper airway irritation syndrome Partial thyroidectomy due to benign goiter by Dr. Milbert Coulter Sinus surgery Dr. Wilburn Cornelia in the past  Visiting [place] hospitalized there for 3 days given inhalers over the holidays then situational circumstances [place] and she came in with either a panic attack or acute asthma  exacerbation  Hospital Course:   Acute asthma exacerbation-mild wheeze-BNP 22 ruling out heart failure Hypoxic respiratory failure on admission -in the ED given albuterol Atrovent Solu-Medrol and was on BiPAP but is much improved and has been placed on prednisone 40 till 1/11, was given Symbicort inhalers at [facility] which she can continue Has restrictive habitus in addition to OSA- did require oxygen on this admission on ambulation and sats dropped lower than 85-ordered oxygen 2 L on discharge and will need outpatient pulmonology follow-up Atrial flutter plus PVCs on flecainide Eliquis- Stable at this time keep on monitors-magnesium is 2.6 OSA, morbid obesity with restrictive habitus Stable at this time needs significant weight loss continue BiPAP at night Questionable history of pulmonary emboli in the past Is on Eliquis7 year old female with known bronchiectasis  Hypokalemia on admission Corrected to 4.2-discontinue supplementation     Discharge Exam: Vitals:   07/03/18 0610 07/03/18 0731  BP: (!) 143/75   Pulse: 77   Resp: 15   Temp: 98.2 F (36.8 C)   SpO2: 93% 94%    General: Awake pleasant alert no distress sitting up in the bed off of oxygen Cardiovascular: S1-S2 no murmur rub or gallop Respiratory: Chest not wheezy clear no added sound no rales no rhonchi Abdomen soft nontender no rebound no guarding No lower extremity edema Neurologically intact moving all 4 limbs equally without deficit Psychiatric euthymic Trace lower extremity edema  Discharge Instructions    Allergies as of 07/03/2018      Reactions   Caffeine Other (See Comments)   Migraine   Vicodin [hydrocodone-acetaminophen] Nausea And Vomiting, Other (See Comments)   Headache    Hydralazine Hcl    Other reaction(s): Other (See Comments) AKI leading to  rhabdo and electrolyte abnormalities   Hydrocodone-acetaminophen Nausea And Vomiting   Ciprofloxacin Hives, Rash   Erythromycin Hives,  Rash   Lisinopril Cough   Other reaction(s): Cough   Oxycodone Nausea And Vomiting   headache Other reaction(s): Vomiting   Sulfamethoxazole-trimethoprim Rash      Medication List    TAKE these medications   acetaminophen 500 MG tablet Commonly known as:  TYLENOL Take 500 mg by mouth every 6 (six) hours as needed.   albuterol 108 (90 Base) MCG/ACT inhaler Commonly known as:  PROVENTIL HFA;VENTOLIN HFA Inhale 2 puffs into the lungs every 6 (six) hours as needed for wheezing or shortness of breath.   budesonide-formoterol 160-4.5 MCG/ACT inhaler Commonly known as:  SYMBICORT Inhale 2 puffs into the lungs 2 (two) times daily.   cycloSPORINE 0.05 % ophthalmic emulsion Commonly known as:  RESTASIS Place 1 drop into both eyes 2 (two) times daily.   diclofenac sodium 1 % Gel Commonly known as:  VOLTAREN APPLY 2 GRAMS EXTERNALLY TO THE AFFECTED AREA FOUR TIMES DAILY What changed:  See the new instructions.   diltiazem 120 MG 24 hr capsule Commonly known as:  CARDIZEM CD Take 1 capsule (120 mg total) by mouth daily. Please keep upcoming appt in January before anymore refills. Final Attempt   flecainide 100 MG tablet Commonly known as:  TAMBOCOR Take 1 tablet (100 mg total) by mouth 2 (two) times daily. Please keep upcoming appt in January before anymore refills. Final Attempt   fluticasone 50 MCG/ACT nasal spray Commonly known as:  FLONASE Place 1 spray into both nostrils daily.   hydrOXYzine 25 MG tablet Commonly known as:  ATARAX/VISTARIL Take 25 mg by mouth 2 (two) times daily as needed for anxiety.   loratadine 10 MG tablet Commonly known as:  CLARITIN Take 10 mg by mouth daily.   meclizine 12.5 MG tablet Commonly known as:  ANTIVERT Take 1 tablet (12.5 mg total) by mouth 3 (three) times daily as needed for dizziness.   potassium chloride SA 20 MEQ tablet Commonly known as:  K-DUR,KLOR-CON Take 2 tablets (40 mEq total) by mouth daily.   predniSONE 20 MG  tablet Commonly known as:  DELTASONE Take 2 tablets (40 mg total) by mouth daily before breakfast.   rivaroxaban 20 MG Tabs tablet Commonly known as:  XARELTO TAKE 1 TABLET(20 MG) BY MOUTH DAILY AFTER SUPPER   venlafaxine XR 150 MG 24 hr capsule Commonly known as:  EFFEXOR-XR Take 150 mg by mouth daily with breakfast.            Durable Medical Equipment  (From admission, onward)         Start     Ordered   07/03/18 1038  DME Oxygen  Once    Question Answer Comment  Mode or (Route) Nasal cannula   Liters per Minute 2   Frequency Continuous (stationary and portable oxygen unit needed)   Oxygen conserving device No   Oxygen delivery system Gas      07/03/18 1039         Allergies  Allergen Reactions  . Caffeine Other (See Comments)    Migraine  . Vicodin [Hydrocodone-Acetaminophen] Nausea And Vomiting and Other (See Comments)    Headache   . Hydralazine Hcl     Other reaction(s): Other (See Comments) AKI leading to rhabdo and electrolyte abnormalities  . Hydrocodone-Acetaminophen Nausea And Vomiting  . Ciprofloxacin Hives and Rash  . Erythromycin Hives and Rash  . Lisinopril Cough  Other reaction(s): Cough  . Oxycodone Nausea And Vomiting    headache Other reaction(s): Vomiting  . Sulfamethoxazole-Trimethoprim Rash      The results of significant diagnostics from this hospitalization (including imaging, microbiology, ancillary and laboratory) are listed below for reference.    Significant Diagnostic Studies: Dg Chest Port 1 View  Result Date: 07/01/2018 CLINICAL DATA:  Shortness of breath. EXAM: PORTABLE CHEST 1 VIEW COMPARISON:  04/01/2015 FINDINGS: The heart size and mediastinal contours are within normal limits. Lungs show bibasilar opacities with volume loss and stable elevation of the right hemidiaphragm. While this likely represents atelectasis, a component of pneumonia cannot be excluded at either lung base. No overt pulmonary edema, pleural  effusion or pneumothorax identified. The visualized skeletal structures are unremarkable. IMPRESSION: Bibasilar opacities with volume loss. This likely represents atelectasis. Component of pneumonia cannot be excluded. Electronically Signed   By: Aletta Edouard M.D.   On: 07/01/2018 10:30    Microbiology: No results found for this or any previous visit (from the past 240 hour(s)).   Labs: Basic Metabolic Panel: Recent Labs  Lab 07/01/18 0836 07/02/18 0433  NA 140 139  K 3.0* 4.2  CL 104 106  CO2 23 24  GLUCOSE 139* 137*  BUN 22 18  CREATININE 1.00 0.65  CALCIUM 9.0 8.6*  MG  --  2.6*   Liver Function Tests: Recent Labs  Lab 07/01/18 0836  AST 25  ALT 29  ALKPHOS 87  BILITOT 0.8  PROT 7.9  ALBUMIN 3.9   No results for input(s): LIPASE, AMYLASE in the last 168 hours. No results for input(s): AMMONIA in the last 168 hours. CBC: Recent Labs  Lab 07/01/18 0836 07/02/18 0433  WBC 12.4* 12.4*  NEUTROABS 9.1*  --   HGB 13.5 12.2  HCT 42.7 39.3  MCV 96.8 99.2  PLT 346 311   Cardiac Enzymes: Recent Labs  Lab 07/01/18 0836  TROPONINI <0.03   BNP: BNP (last 3 results) Recent Labs    07/01/18 0836  BNP 22.4    ProBNP (last 3 results) No results for input(s): PROBNP in the last 8760 hours.  CBG: No results for input(s): GLUCAP in the last 168 hours.     Signed:  Nita Sells MD   [facility] 07/03/2018, 10:19 AM

## 2018-07-04 ENCOUNTER — Ambulatory Visit: Payer: Medicare HMO | Admitting: Nurse Practitioner

## 2018-07-04 ENCOUNTER — Ambulatory Visit (INDEPENDENT_AMBULATORY_CARE_PROVIDER_SITE_OTHER): Payer: Medicare HMO | Admitting: Nurse Practitioner

## 2018-07-04 VITALS — BP 130/82 | HR 105 | Ht 65.0 in | Wt 301.0 lb

## 2018-07-04 DIAGNOSIS — I471 Supraventricular tachycardia: Secondary | ICD-10-CM

## 2018-07-04 DIAGNOSIS — I482 Chronic atrial fibrillation, unspecified: Secondary | ICD-10-CM

## 2018-07-04 DIAGNOSIS — R0602 Shortness of breath: Secondary | ICD-10-CM

## 2018-07-04 MED ORDER — RIVAROXABAN 20 MG PO TABS: ORAL_TABLET | ORAL | 3 refills | Status: DC

## 2018-07-04 MED ORDER — FLECAINIDE ACETATE 100 MG PO TABS: 100.0000 mg | ORAL_TABLET | Freq: Two times a day (BID) | ORAL | 3 refills | Status: DC

## 2018-07-04 MED ORDER — DILTIAZEM HCL ER COATED BEADS 120 MG PO CP24: 120.0000 mg | ORAL_CAPSULE | Freq: Every day | ORAL | 3 refills | Status: DC

## 2018-07-04 NOTE — Progress Notes (Signed)
Electrophysiology Office Note Date: 07/05/2018  ID:  [name] White, DOB 1956/01/20, MRN [id number]  PCP: [name], MD Electrophysiologist: Lovena Le  CC: SVT follow up  [name] White is a 63 y.o. female seen today for Dr Lovena Le.  She presents today for routine electrophysiology followup.  Since last being seen in our clinic, the patient reports doing reasonably well.  She was recently hospitalized for COPD exacerbation.  Over the last year, her mom has been ill and she has been the primary caregiver.  She denies chest pain, palpitations, PND, orthopnea, nausea, vomiting, dizziness, syncope, edema, weight gain, or early satiety.  Past Medical History:  Diagnosis Date  . Anxiety   . Asthma   . Chronic lower back pain   . Depression   . Family history of anesthesia complication    "daughter; causes her to pass out afterwards"  . GERD (gastroesophageal reflux disease)   . Hypertension   . Hyperthyroidism   . Migraine    "monthly" (12/28/2013)  . Osteoarthritis    "both knees; back of my neck; right pelvic bone" (12/28/2013)  . Paroxysmal A-fib ([place])   . Pulmonary embolism ([place]) 12/28/2013   "2 clots in each lung"   Past Surgical History:  Procedure Laterality Date  . ABDOMINAL HYSTERECTOMY    . APPENDECTOMY    . BREAST CYST EXCISION Right   . DILATION AND CURETTAGE OF UTERUS    . ELECTROPHYSIOLOGIC STUDY N/A 05/27/2015   Procedure: A-Flutter;  Surgeon: Evans Lance, MD;  Location: [facility];  Service: Cardiovascular;  Laterality: N/A;  . EXCISIONAL HEMORRHOIDECTOMY    . NASAL SINUS SURGERY  2007  . THYROIDECTOMY, PARTIAL Right 2005  . TUBAL LIGATION    . WISDOM TOOTH EXTRACTION      Current Outpatient Medications  Medication Sig Dispense Refill  . acetaminophen (TYLENOL) 500 MG tablet Take 500 mg by mouth every 6 (six) hours as needed.    Marland Kitchen albuterol (PROVENTIL HFA;VENTOLIN HFA) 108 (90 Base) MCG/ACT inhaler Inhale 2 puffs into the lungs every 6 (six)  hours as needed for wheezing or shortness of breath. 1 Inhaler 1  . budesonide-formoterol (SYMBICORT) 160-4.5 MCG/ACT inhaler Inhale 2 puffs into the lungs 2 (two) times daily. 1 Inhaler 12  . cycloSPORINE (RESTASIS) 0.05 % ophthalmic emulsion Place 1 drop into both eyes 2 (two) times daily.    . diclofenac sodium (VOLTAREN) 1 % GEL APPLY 2 GRAMS EXTERNALLY TO THE AFFECTED AREA FOUR TIMES DAILY (Patient taking differently: Apply 2 g topically 4 (four) times daily. ) 100 g 0  . diltiazem (CARDIZEM CD) 120 MG 24 hr capsule Take 1 capsule (120 mg total) by mouth daily. Please keep upcoming appt in January before anymore refills. Final Attempt 90 capsule 3  . flecainide (TAMBOCOR) 100 MG tablet Take 1 tablet (100 mg total) by mouth 2 (two) times daily. Please keep upcoming appt in January before anymore refills. Final Attempt 180 tablet 3  . fluticasone (FLONASE) 50 MCG/ACT nasal spray Place 1 spray into both nostrils daily. 1 g 0  . hydrOXYzine (ATARAX/VISTARIL) 25 MG tablet Take 25 mg by mouth 2 (two) times daily as needed for anxiety.    Marland Kitchen loratadine (CLARITIN) 10 MG tablet Take 10 mg by mouth daily.    . meclizine (ANTIVERT) 12.5 MG tablet Take 1 tablet (12.5 mg total) by mouth 3 (three) times daily as needed for dizziness. 30 tablet 0  . potassium chloride SA (K-DUR,KLOR-CON) 20 MEQ tablet  Take 2 tablets (40 mEq total) by mouth daily. 180 tablet 3  . predniSONE (DELTASONE) 20 MG tablet Take 2 tablets (40 mg total) by mouth daily before breakfast. 5 tablet 0  . rivaroxaban (XARELTO) 20 MG TABS tablet TAKE 1 TABLET(20 MG) BY MOUTH DAILY AFTER SUPPER 90 tablet 3  . venlafaxine XR (EFFEXOR-XR) 150 MG 24 hr capsule Take 150 mg by mouth daily with breakfast.     No current facility-administered medications for this visit.     Allergies:   Caffeine; Vicodin [hydrocodone-acetaminophen]; Hydralazine hcl; Hydrocodone-acetaminophen; Ciprofloxacin; Erythromycin; Lisinopril; Oxycodone; and  Sulfamethoxazole-trimethoprim   Social History: Social History   Socioeconomic History  . Marital status: Single    Spouse name: Not on file  . Number of children: Not on file  . Years of education: Not on file  . Highest education level: Not on file  Occupational History  . Not on file  Social Needs  . Financial resource strain: Not on file  . Food insecurity:    Worry: Not on file    Inability: Not on file  . Transportation needs:    Medical: Not on file    Non-medical: Not on file  Tobacco Use  . Smoking status: Former Smoker    Packs/day: 0.25    Years: 20.00    Pack years: 5.00    Types: Cigarettes    Last attempt to quit: 07/17/1999    Years since quitting: 18.9  . Smokeless tobacco: Never Used  Substance and Sexual Activity  . Alcohol use: Not Currently    Comment: "drank some in my 30's"  . Drug use: No  . Sexual activity: Never  Lifestyle  . Physical activity:    Days per week: Not on file    Minutes per session: Not on file  . Stress: Not on file  Relationships  . Social connections:    Talks on phone: Not on file    Gets together: Not on file    Attends religious service: Not on file    Active member of club or organization: Not on file    Attends meetings of clubs or organizations: Not on file    Relationship status: Not on file  . Intimate partner violence:    Fear of current or ex partner: Not on file    Emotionally abused: Not on file    Physically abused: Not on file    Forced sexual activity: Not on file  Other Topics Concern  . Not on file  Social History Narrative  . Not on file    Family History: Family History  Problem Relation Age of Onset  . Osteoarthritis Mother   . Asthma Mother   . Heart failure Mother   . Breast cancer Daughter     Review of Systems: All other systems reviewed and are otherwise negative except as noted above.   Physical Exam: VS:  BP 130/82   Pulse (!) 105   Ht 5\' 5"  (1.651 m)   Wt (!) 301 lb (136.5  kg)   BMI 50.09 kg/m  , BMI Body mass index is 50.09 kg/m. Wt Readings from Last 3 Encounters:  07/04/18 (!) 301 lb (136.5 kg)  07/01/18 292 lb (132.5 kg)  02/09/18 300 lb (136.1 kg)    GEN- The patient is obese appearing, alert and oriented x 3 today.   HEENT: normocephalic, atraumatic; sclera clear, conjunctiva pink; hearing intact; oropharynx clear; neck supple  Lungs- Clear to ausculation bilaterally, normal work of breathing.  No wheezes, rales, rhonchi Heart- Regular rate and rhythm GI- soft, non-tender, non-distended, bowel sounds present  Extremities- no clubbing, cyanosis, or edema  MS- no significant deformity or atrophy Skin- warm and dry, no rash or lesion  Psych- euthymic mood, full affect Neuro- strength and sensation are intact   EKG:  EKG is ordered today. The ekg ordered today shows ST, normal intervals, QRS 28msec, very wavy baseline  Recent Labs: 07/01/2018: ALT 29; B Natriuretic Peptide 22.4 07/02/2018: BUN 18; Creatinine, Ser 0.65; Hemoglobin 12.2; Magnesium 2.6; Platelets 311; Potassium 4.2; Sodium 139    Other studies Reviewed: Additional studies/ records that were reviewed today include: Dr Tanna Furry office notes  Assessment and Plan:  1. Palpitations Doing wel on Flecainide EKG stable today No sustained arrhythmias by monitor 2018  2.  Obesity Body mass index is 50.09 kg/m. Weight loss encouraged  3.  HTN Stable No change required today  4.  Atypical atrial flutter Continue Xarelto for CHADS2VASC of 3 Recent labs reviewed     Current medicines are reviewed at length with the patient today.   The patient does not have concerns regarding her medicines.  The following changes were made today:  none  Labs/ tests ordered today include: none Orders Placed This Encounter  Procedures  . EKG 12-Lead     Disposition:   Follow up with Dr Lovena Le in 1 year     Signed, Chanetta Marshall, NP 07/05/2018 5:14 AM   [facility] [address] [place] [address] [phone] (office) [phone] (fax)

## 2018-07-04 NOTE — Patient Instructions (Signed)
Medication Instructions:  none If you need a refill on your cardiac medications before your next appointment, please call your pharmacy.   Lab work: none If you have labs (blood work) drawn today and your tests are completely normal, you will receive your results only by: . MyChart Message (if you have MyChart) OR . A paper copy in the mail If you have any lab test that is abnormal or we need to change your treatment, we will call you to review the results.  Testing/Procedures: none  Follow-Up: At [facility], you and your health needs are our priority.  As part of our continuing mission to provide you with exceptional heart care, we have created designated Provider Care Teams.  These Care Teams include your primary Cardiologist (physician) and Advanced Practice Providers (APPs -  Physician Assistants and Nurse Practitioners) who all work together to provide you with the care you need, when you need it. You will need a follow up appointment in 1 years.  Please call our office 2 months in advance to schedule this appointment.  You may see Dr Taylor or one of the following Advanced Practice Providers on your designated Care Team:   Amber Seiler, NP . Renee Ursuy, PA-C  Any Other Special Instructions Will Be Listed Below (If Applicable).    

## 2018-07-05 ENCOUNTER — Telehealth: Payer: Self-pay | Admitting: Emergency Medicine

## 2018-07-05 ENCOUNTER — Encounter: Payer: Self-pay | Admitting: Emergency Medicine

## 2018-07-05 ENCOUNTER — Ambulatory Visit (INDEPENDENT_AMBULATORY_CARE_PROVIDER_SITE_OTHER): Payer: Medicare HMO | Admitting: Emergency Medicine

## 2018-07-05 DIAGNOSIS — G4730 Sleep apnea, unspecified: Secondary | ICD-10-CM | POA: Insufficient documentation

## 2018-07-05 DIAGNOSIS — J4520 Mild intermittent asthma, uncomplicated: Secondary | ICD-10-CM

## 2018-07-05 DIAGNOSIS — G4733 Obstructive sleep apnea (adult) (pediatric): Secondary | ICD-10-CM | POA: Diagnosis not present

## 2018-07-05 DIAGNOSIS — J383 Other diseases of vocal cords: Secondary | ICD-10-CM | POA: Diagnosis not present

## 2018-07-05 DIAGNOSIS — J9611 Chronic respiratory failure with hypoxia: Secondary | ICD-10-CM

## 2018-07-05 DIAGNOSIS — J9690 Respiratory failure, unspecified, unspecified whether with hypoxia or hypercapnia: Secondary | ICD-10-CM | POA: Insufficient documentation

## 2018-07-05 MED ORDER — PANTOPRAZOLE SODIUM 40 MG PO TBEC: 40.0000 mg | DELAYED_RELEASE_TABLET | Freq: Every day | ORAL | 5 refills | Status: DC

## 2018-07-05 NOTE — Assessment & Plan Note (Signed)
Continue Flonase nasal spray, loratadine as you are taking them. Please start pantoprazole 40 mg once daily.  We will decide at your next visit whether to continue his medication.

## 2018-07-05 NOTE — Assessment & Plan Note (Signed)
  Continue Flonase nasal spray, loratadine as you are taking them.

## 2018-07-05 NOTE — Progress Notes (Signed)
Subjective:    Patient ID: [name] White, female    DOB: 04/12/1956, 63 y.o.   MRN: [id number]  HPI 63 year old woman, former smoker (pk-yrs) with a hx of mild obstruction, upper airway irritation syndrome in setting of rhinitis and GERD. Also hx remote PE 2015, A fib / flutter s/p ablation.  She is referred today to re-establish care. She had an acute illness when travelling to [place] for Thanksgiving, was hospitalized for an acute flare of VCD +/- asthma in setting of a URI, sick contact from her daughter. She was then re-hospitalized at [facility] for persistent chest tightness, wheeze d/c 1/7. She temporarily required BiPAP. She denies any GERD sx currently. She is on flonase and loratadine. At d/c she was continued on Symbicort, prednisone taper. Required initiation exertional O2. She has albuterol nebs, uses occasionally.   She had lost 77 lbs, but over the last 18 months she has gained it back.    Review of Systems  Constitutional: Negative for fever and unexpected weight change.  HENT: Positive for congestion. Negative for dental problem, ear pain, nosebleeds, postnasal drip, rhinorrhea, sinus pressure, sneezing, sore throat and trouble swallowing.   Eyes: Negative for redness and itching.  Respiratory: Positive for cough, shortness of breath and wheezing. Negative for chest tightness.   Cardiovascular: Negative for palpitations and leg swelling.  Gastrointestinal: Negative for nausea and vomiting.  Genitourinary: Negative for dysuria.  Musculoskeletal: Negative for joint swelling.  Skin: Negative for rash.  Neurological: Negative for headaches.  Hematological: Does not bruise/bleed easily.  Psychiatric/Behavioral: Negative for dysphoric mood. The patient is not nervous/anxious.    Past Medical History:  Diagnosis Date  . Anxiety   . Asthma   . Chronic lower back pain   . Depression   . Family history of anesthesia complication    "daughter; causes her to pass out afterwards"    . GERD (gastroesophageal reflux disease)   . Hypertension   . Hyperthyroidism   . Migraine    "monthly" (12/28/2013)  . Osteoarthritis    "both knees; back of my neck; right pelvic bone" (12/28/2013)  . Paroxysmal A-fib ([place])   . Pulmonary embolism ([place]) 12/28/2013   "2 clots in each lung"     Family History  Problem Relation Age of Onset  . Osteoarthritis Mother   . Asthma Mother   . Heart failure Mother   . Breast cancer Daughter      Social History   Socioeconomic History  . Marital status: Single    Spouse name: Not on file  . Number of children: Not on file  . Years of education: Not on file  . Highest education level: Not on file  Occupational History  . Not on file  Social Needs  . Financial resource strain: Not on file  . Food insecurity:    Worry: Not on file    Inability: Not on file  . Transportation needs:    Medical: Not on file    Non-medical: Not on file  Tobacco Use  . Smoking status: Former Smoker    Packs/day: 0.25    Years: 20.00    Pack years: 5.00    Types: Cigarettes    Last attempt to quit: 07/17/1999    Years since quitting: 18.9  . Smokeless tobacco: Never Used  Substance and Sexual Activity  . Alcohol use: Not Currently    Comment: "drank some in my 30's"  . Drug use: No  . Sexual activity: Never  Lifestyle  .  Physical activity:    Days per week: Not on file    Minutes per session: Not on file  . Stress: Not on file  Relationships  . Social connections:    Talks on phone: Not on file    Gets together: Not on file    Attends religious service: Not on file    Active member of club or organization: Not on file    Attends meetings of clubs or organizations: Not on file    Relationship status: Not on file  . Intimate partner violence:    Fear of current or ex partner: Not on file    Emotionally abused: Not on file    Physically abused: Not on file    Forced sexual activity: Not on file  Other Topics Concern  . Not on file  Social  History Narrative  . Not on file     Allergies  Allergen Reactions  . Caffeine Other (See Comments)    Migraine  . Vicodin [Hydrocodone-Acetaminophen] Nausea And Vomiting and Other (See Comments)    Headache   . Hydralazine Hcl     Other reaction(s): Other (See Comments) AKI leading to rhabdo and electrolyte abnormalities  . Hydrocodone-Acetaminophen Nausea And Vomiting  . Ciprofloxacin Hives and Rash  . Erythromycin Hives and Rash  . Lisinopril Cough    Other reaction(s): Cough  . Oxycodone Nausea And Vomiting    headache Other reaction(s): Vomiting  . Sulfamethoxazole-Trimethoprim Rash     Outpatient Medications Prior to Visit  Medication Sig Dispense Refill  . acetaminophen (TYLENOL) 500 MG tablet Take 500 mg by mouth every 6 (six) hours as needed.    Marland Kitchen albuterol (PROVENTIL HFA;VENTOLIN HFA) 108 (90 Base) MCG/ACT inhaler Inhale 2 puffs into the lungs every 6 (six) hours as needed for wheezing or shortness of breath. 1 Inhaler 1  . budesonide-formoterol (SYMBICORT) 160-4.5 MCG/ACT inhaler Inhale 2 puffs into the lungs 2 (two) times daily. 1 Inhaler 12  . cycloSPORINE (RESTASIS) 0.05 % ophthalmic emulsion Place 1 drop into both eyes 2 (two) times daily.    . diclofenac sodium (VOLTAREN) 1 % GEL APPLY 2 GRAMS EXTERNALLY TO THE AFFECTED AREA FOUR TIMES DAILY (Patient taking differently: Apply 2 g topically 4 (four) times daily. ) 100 g 0  . diltiazem (CARDIZEM CD) 120 MG 24 hr capsule Take 1 capsule (120 mg total) by mouth daily. Please keep upcoming appt in January before anymore refills. Final Attempt 90 capsule 3  . flecainide (TAMBOCOR) 100 MG tablet Take 1 tablet (100 mg total) by mouth 2 (two) times daily. Please keep upcoming appt in January before anymore refills. Final Attempt 180 tablet 3  . fluticasone (FLONASE) 50 MCG/ACT nasal spray Place 1 spray into both nostrils daily. 1 g 0  . hydrOXYzine (ATARAX/VISTARIL) 25 MG tablet Take 25 mg by mouth 2 (two) times daily as  needed for anxiety.    Marland Kitchen loratadine (CLARITIN) 10 MG tablet Take 10 mg by mouth daily.    . meclizine (ANTIVERT) 12.5 MG tablet Take 1 tablet (12.5 mg total) by mouth 3 (three) times daily as needed for dizziness. 30 tablet 0  . potassium chloride SA (K-DUR,KLOR-CON) 20 MEQ tablet Take 2 tablets (40 mEq total) by mouth daily. 180 tablet 3  . predniSONE (DELTASONE) 20 MG tablet Take 2 tablets (40 mg total) by mouth daily before breakfast. 5 tablet 0  . rivaroxaban (XARELTO) 20 MG TABS tablet TAKE 1 TABLET(20 MG) BY MOUTH DAILY AFTER SUPPER 90 tablet 3  .  venlafaxine XR (EFFEXOR-XR) 150 MG 24 hr capsule Take 150 mg by mouth daily with breakfast.     No facility-administered medications prior to visit.         Objective:   Physical Exam Vitals:   07/05/18 1421  BP: 132/76  Pulse: 95  SpO2: 97%  Weight: (!) 301 lb (136.5 kg)  Height: 5\' 5"  (1.651 m)   Gen: Pleasant, obese woman, in no distress,  presssured affect  ENT: No lesions,  mouth clear,  oropharynx clear, no postnasal drip  Neck: No JVD, soft stridor  Lungs: No use of accessory muscles, no crackles or wheezing on normal respiration, no wheeze on forced expiration  Cardiovascular: RRR, heart sounds normal, no murmur or gallops, no peripheral edema  Musculoskeletal: No deformities, no cyanosis or clubbing  Neuro: alert, awake, non focal  Skin: Warm, no lesions or rash       Assessment & Plan:  Uncomplicated asthma Recent flaring of dyspnea, wheeze.  Difficult to tell how much of this is true asthma and how much is upper airway irritation.  Probably a component of both.  She is completing a prednisone taper from the recent hospitalization.  I think she needs pulmonary function testing to compare with priors.  I will continue the Symbicort for now although I stopped her BD's in the past due to upper airway irritation.   Please continue Symbicort 2 puffs twice a day for now.  We may decide to stop this medication going  forward.  Remember to rinse and gargle after using. Keep your albuterol available to use 1 nebulizer treatment or 2 puffs if needed for shortness of breath, chest tightness, wheezing. We will arrange for full pulmonary function testing to compare with your priors. Please continue your prednisone taper as directed post hospitalization until you have taking completely.  RHINITIS, ALLERGIC  Continue Flonase nasal spray, loratadine as you are taking them.  Vocal cord dysfunction Continue Flonase nasal spray, loratadine as you are taking them. Please start pantoprazole 40 mg once daily.  We will decide at your next visit whether to continue his medication.   Sleep apnea Probable OSA.   We will arrange for a split-night sleep study to evaluate for sleep apnea. You would benefit from increasing your exercise and weight loss.   Chronic respiratory failure with hypoxia (HCC) We confirmed her exercise hypoxemia today.  I will continue her on 2 L/min with exertion, she can be on room air at rest.  Suspect that this is due to OHS  Baltazar Apo, MD, PhD 07/05/2018, 3:07 PM [facility] [phone] or if no answer [phone]

## 2018-07-05 NOTE — Telephone Encounter (Signed)
Will route to RB as an [place].

## 2018-07-05 NOTE — Patient Instructions (Signed)
Please continue Symbicort 2 puffs twice a day for now.  We may decide to stop this medication going forward.  Remember to rinse and gargle after using. Keep your albuterol available to use 1 nebulizer treatment or 2 puffs if needed for shortness of breath, chest tightness, wheezing. We will arrange for full pulmonary function testing to compare with your priors. Please continue your prednisone taper as directed post hospitalization until you have taking completely. Please continue to use your oxygen at 2 L/min when you are exerting herself.  You do not need to wear it when you are sitting still. Continue Flonase nasal spray, loratadine as you are taking them. Please start pantoprazole 40 mg once daily.  We will decide at your next visit whether to continue his medication. We will arrange for a split-night sleep study to evaluate for sleep apnea. You would benefit from increasing your exercise and weight loss. Follow with Dr Lamonte Sakai next available with full PFT on the same day

## 2018-07-05 NOTE — Assessment & Plan Note (Signed)
We confirmed her exercise hypoxemia today.  I will continue her on 2 L/min with exertion, she can be on room air at rest.  Suspect that this is due to OHS

## 2018-07-05 NOTE — Telephone Encounter (Signed)
OK thank you 

## 2018-07-05 NOTE — Assessment & Plan Note (Signed)
Probable OSA.   We will arrange for a split-night sleep study to evaluate for sleep apnea. You would benefit from increasing your exercise and weight loss.

## 2018-07-05 NOTE — Addendum Note (Signed)
Addended by: Desmond Dike C on: 07/05/2018 03:22 PM   Modules accepted: Orders

## 2018-07-05 NOTE — Assessment & Plan Note (Signed)
Recent flaring of dyspnea, wheeze.  Difficult to tell how much of this is true asthma and how much is upper airway irritation.  Probably a component of both.  She is completing a prednisone taper from the recent hospitalization.  I think she needs pulmonary function testing to compare with priors.  I will continue the Symbicort for now although I stopped her BD's in the past due to upper airway irritation.   Please continue Symbicort 2 puffs twice a day for now.  We may decide to stop this medication going forward.  Remember to rinse and gargle after using. Keep your albuterol available to use 1 nebulizer treatment or 2 puffs if needed for shortness of breath, chest tightness, wheezing. We will arrange for full pulmonary function testing to compare with your priors. Please continue your prednisone taper as directed post hospitalization until you have taking completely.

## 2018-07-09 ENCOUNTER — Telehealth: Payer: Self-pay | Admitting: Emergency Medicine

## 2018-07-09 MED ORDER — ALBUTEROL SULFATE (2.5 MG/3ML) 0.083% IN NEBU: 2.5000 mg | INHALATION_SOLUTION | Freq: Four times a day (QID) | RESPIRATORY_TRACT | 4 refills | Status: DC | PRN

## 2018-07-09 NOTE — Telephone Encounter (Signed)
(  3) States out of breath on 2L of O2 going to the bathroom and back.

## 2018-07-09 NOTE — Telephone Encounter (Signed)
Called and spoke to pt.  Pt is requesting alternative Protonix, as she developed diarrhea, nausea, weakness and dizziness.  Pt states sx occurred within a few hours of taking first dose. Pt states she has only taken two doses.  Pt also reports increased sob and wheezing with mild exertion on 2L oxygen x3 days. Pt states sob improves after 5-55min of pursed lip breathing and rest.  Pt stated that she does not have a pulse ox to check oxygen levels.  I have offered appt, pt declined. Pt also requested refill on albuterol nebulizer. Rx has been sent to preferred pharmacy, as this medication was mentioned within 07/05/18 office note.   RB please advise. Thanks.

## 2018-07-10 NOTE — Telephone Encounter (Signed)
I believe that she has upper airway obstruction and irritation, and also some superimposed asthma. She is recovering from a recent flare. There may be other things affecting her shortness of breath including her weight and some deconditioning. She needs to follow up with me as planned to further trouble shoot. If she believes that she is acutely worsening then she needs to be seen here sooner.

## 2018-07-10 NOTE — Telephone Encounter (Signed)
Called and spoke to pt. She had called 07/09/18 for Protonix alternative.    Message from 07/09/18- Pt is requesting alternative Protonix, as she developed diarrhea, nausea, weakness and dizziness.  Pt states sx occurred within a few hours of taking first dose. Pt states she has only taken two doses.  Pt also reports increased sob and wheezing with mild exertion on 2L oxygen x3 days. Pt states sob improves after 5-29min of pursed lip breathing and rest.  Pt stated that she does not have a pulse ox to check oxygen levels.  I have offered appt, pt declined. Pt also requested refill on albuterol nebulizer. Rx has been sent to preferred pharmacy, as this medication was mentioned within 07/05/18 office note.   RB please advise. Thanks.

## 2018-07-10 NOTE — Telephone Encounter (Signed)
Pt is calling back [phone]

## 2018-07-10 NOTE — Telephone Encounter (Signed)
Please ask her to try changing to pepcid 20mg  bid

## 2018-07-10 NOTE — Telephone Encounter (Signed)
Called and spoke Patient.  Dr. Agustina Caroli recommendations to change to Pepcid 20mg , BID, given.  Understanding stated.   Patient stated that she would like recommendations about being [facility].  She is using 2L [place], but has increased SHOB.  She stated that she is using purses lip breathing, and rest, when she experiences SHOB.    Dr. Lamonte Sakai, please advise

## 2018-07-10 NOTE — Telephone Encounter (Signed)
Called and spoke with Patient.  RB recommendations given.  Patient stated understanding.  Patient is scheduled with RB 07/18/18, Patient declined to change appointment or see NP, before 07/18/18. Nothing further at this time.

## 2018-07-18 ENCOUNTER — Ambulatory Visit (INDEPENDENT_AMBULATORY_CARE_PROVIDER_SITE_OTHER): Payer: Medicare HMO | Admitting: Emergency Medicine

## 2018-07-18 ENCOUNTER — Encounter: Payer: Self-pay | Admitting: Emergency Medicine

## 2018-07-18 VITALS — BP 102/60 | HR 66 | Ht 65.0 in | Wt 302.0 lb

## 2018-07-18 DIAGNOSIS — I482 Chronic atrial fibrillation, unspecified: Secondary | ICD-10-CM | POA: Diagnosis not present

## 2018-07-18 DIAGNOSIS — J4520 Mild intermittent asthma, uncomplicated: Secondary | ICD-10-CM

## 2018-07-18 DIAGNOSIS — J383 Other diseases of vocal cords: Secondary | ICD-10-CM

## 2018-07-18 DIAGNOSIS — G4733 Obstructive sleep apnea (adult) (pediatric): Secondary | ICD-10-CM | POA: Diagnosis not present

## 2018-07-18 LAB — PULMONARY FUNCTION TEST
DL/VA % pred: 70 %
DL/VA: 3.45 ml/min/mmHg/L
DLCO COR: 17.43 ml/min/mmHg
DLCO UNC % PRED: 65 %
DLCO cor % pred: 68 %
DLCO unc: 16.76 ml/min/mmHg
FEF 25-75 PRE: 2.38 L/s
FEF 25-75 Post: 2.61 L/sec
FEF2575-%Change-Post: 9 %
FEF2575-%Pred-Post: 128 %
FEF2575-%Pred-Pre: 117 %
FEV1-%Change-Post: 0 %
FEV1-%Pred-Post: 123 %
FEV1-%Pred-Pre: 123 %
FEV1-Post: 2.61 L
FEV1-Pre: 2.6 L
FEV1FVC-%Change-Post: 5 %
FEV1FVC-%Pred-Pre: 102 %
FEV6-%Change-Post: -5 %
FEV6-%Pred-Post: 117 %
FEV6-%Pred-Pre: 123 %
FEV6-Post: 3.05 L
FEV6-Pre: 3.22 L
FEV6FVC-%Pred-Post: 103 %
FEV6FVC-%Pred-Pre: 103 %
FVC-%Change-Post: -4 %
FVC-%Pred-Post: 113 %
FVC-%Pred-Pre: 119 %
FVC-Post: 3.06 L
FVC-Pre: 3.22 L
PRE FEV1/FVC RATIO: 81 %
Post FEV1/FVC ratio: 85 %
Post FEV6/FVC ratio: 100 %
Pre FEV6/FVC Ratio: 100 %
RV % pred: 123 %
RV: 2.6 L
TLC % pred: 120 %
TLC: 6.29 L

## 2018-07-18 NOTE — Assessment & Plan Note (Signed)
Suspected sleep apnea and nocturnal hypoventilation.  Suspect she needs either CPAP or BiPAP at night.  She is about to go to [facility] to be with her daughter and wants to get her sleep study done there.  She will speak to her local pulmonologist and try to arrange.

## 2018-07-18 NOTE — Progress Notes (Signed)
Subjective:    Patient ID: [name] White, female    DOB: [birth year], 63 y.o.   MRN: [id number]  HPI 63 year old woman, former smoker (10 pk-yrs) with a hx of mild obstruction, upper airway irritation syndrome in setting of rhinitis and GERD. Also hx remote PE 2015, A fib / flutter s/p ablation.  She is referred today to re-establish care. She had an acute illness when travelling to [place] for Thanksgiving, was hospitalized for an acute flare of VCD +/- asthma in setting of a URI, sick contact from her daughter. She was then re-hospitalized at [facility] for persistent chest tightness, wheeze d/c 1/7. She temporarily required BiPAP. She denies any GERD sx currently. She is on flonase and loratadine. At d/c she was continued on Symbicort, prednisone taper. Required initiation exertional O2. She has albuterol nebs, uses occasionally.   She had lost 77 lbs, but over the last 18 months she has gained it back.    ROV 07/18/18 --[name] White is 31, has a history of tobacco use and follows up for her upper airway irritation syndrome/VCD in the setting of rhinitis and GERD.  She also has a history of A. fib/flutter and remote pulmonary embolism.  She has exercise hypoxemia in the setting of OHS.  We also suspect that she has obstructive sleep apnea - no PSG yet. She has daily cough, is having PND and some sore throat on flonase and loratadine. Sometimes wheezing. Sounds like UA noise.    Review of Systems  Constitutional: Negative for fever and unexpected weight change.  HENT: Positive for congestion. Negative for dental problem, ear pain, nosebleeds, postnasal drip, rhinorrhea, sinus pressure, sneezing, sore throat and trouble swallowing.   Eyes: Negative for redness and itching.  Respiratory: Positive for cough, shortness of breath and wheezing. Negative for chest tightness.   Cardiovascular: Negative for palpitations and leg swelling.  Gastrointestinal: Negative for nausea and vomiting.  Genitourinary:  Negative for dysuria.  Musculoskeletal: Negative for joint swelling.  Skin: Negative for rash.  Neurological: Negative for headaches.  Hematological: Does not bruise/bleed easily.  Psychiatric/Behavioral: Negative for dysphoric mood. The patient is not nervous/anxious.    Past Medical History:  Diagnosis Date  . Anxiety   . Asthma   . Chronic lower back pain   . Depression   . Family history of anesthesia complication    "daughter; causes her to pass out afterwards"  . GERD (gastroesophageal reflux disease)   . Hypertension   . Hyperthyroidism   . Migraine    "monthly" (12/28/2013)  . Osteoarthritis    "both knees; back of my neck; right pelvic bone" (12/28/2013)  . Paroxysmal A-fib ([place])   . Pulmonary embolism ([place]) 12/28/2013   "2 clots in each lung"     Family History  Problem Relation Age of Onset  . Osteoarthritis Mother   . Asthma Mother   . Heart failure Mother   . Breast cancer Daughter      Social History   Socioeconomic History  . Marital status: Single    Spouse name: Not on file  . Number of children: Not on file  . Years of education: Not on file  . Highest education level: Not on file  Occupational History  . Not on file  Social Needs  . Financial resource strain: Not on file  . Food insecurity:    Worry: Not on file    Inability: Not on file  . Transportation needs:    Medical: Not on file  Non-medical: Not on file  Tobacco Use  . Smoking status: Former Smoker    Packs/day: 0.25    Years: 20.00    Pack years: 5.00    Types: Cigarettes    Last attempt to quit: 07/17/1999    Years since quitting: 19.0  . Smokeless tobacco: Never Used  Substance and Sexual Activity  . Alcohol use: Not Currently    Comment: "drank some in my 30's"  . Drug use: No  . Sexual activity: Never  Lifestyle  . Physical activity:    Days per week: Not on file    Minutes per session: Not on file  . Stress: Not on file  Relationships  . Social connections:    Talks  on phone: Not on file    Gets together: Not on file    Attends religious service: Not on file    Active member of club or organization: Not on file    Attends meetings of clubs or organizations: Not on file    Relationship status: Not on file  . Intimate partner violence:    Fear of current or ex partner: Not on file    Emotionally abused: Not on file    Physically abused: Not on file    Forced sexual activity: Not on file  Other Topics Concern  . Not on file  Social History Narrative  . Not on file     Allergies  Allergen Reactions  . Caffeine Other (See Comments)    Migraine  . Vicodin [Hydrocodone-Acetaminophen] Nausea And Vomiting and Other (See Comments)    Headache   . Hydralazine Hcl     Other reaction(s): Other (See Comments) AKI leading to rhabdo and electrolyte abnormalities  . Hydrocodone-Acetaminophen Nausea And Vomiting  . Ciprofloxacin Hives and Rash  . Erythromycin Hives and Rash  . Lisinopril Cough    Other reaction(s): Cough  . Oxycodone Nausea And Vomiting    headache Other reaction(s): Vomiting  . Sulfamethoxazole-Trimethoprim Rash     Outpatient Medications Prior to Visit  Medication Sig Dispense Refill  . acetaminophen (TYLENOL) 500 MG tablet Take 500 mg by mouth every 6 (six) hours as needed.    Marland Kitchen albuterol (PROVENTIL HFA;VENTOLIN HFA) 108 (90 Base) MCG/ACT inhaler Inhale 2 puffs into the lungs every 6 (six) hours as needed for wheezing or shortness of breath. 1 Inhaler 1  . albuterol (PROVENTIL) (2.5 MG/3ML) 0.083% nebulizer solution Take 3 mLs (2.5 mg total) by nebulization every 6 (six) hours as needed for wheezing or shortness of breath. 75 mL 4  . budesonide-formoterol (SYMBICORT) 160-4.5 MCG/ACT inhaler Inhale 2 puffs into the lungs 2 (two) times daily. 1 Inhaler 12  . cycloSPORINE (RESTASIS) 0.05 % ophthalmic emulsion Place 1 drop into both eyes 2 (two) times daily.    . diclofenac sodium (VOLTAREN) 1 % GEL APPLY 2 GRAMS EXTERNALLY TO THE  AFFECTED AREA FOUR TIMES DAILY 100 g 0  . diltiazem (CARDIZEM CD) 120 MG 24 hr capsule Take 1 capsule (120 mg total) by mouth daily. Please keep upcoming appt in January before anymore refills. Final Attempt 90 capsule 3  . flecainide (TAMBOCOR) 100 MG tablet Take 1 tablet (100 mg total) by mouth 2 (two) times daily. Please keep upcoming appt in January before anymore refills. Final Attempt 180 tablet 3  . fluticasone (FLONASE) 50 MCG/ACT nasal spray Place 1 spray into both nostrils daily. 1 g 0  . hydrOXYzine (ATARAX/VISTARIL) 25 MG tablet Take 25 mg by mouth 2 (two) times daily  as needed for anxiety.    Marland Kitchen loratadine (CLARITIN) 10 MG tablet Take 10 mg by mouth daily.    . meclizine (ANTIVERT) 12.5 MG tablet Take 1 tablet (12.5 mg total) by mouth 3 (three) times daily as needed for dizziness. 30 tablet 0  . pantoprazole (PROTONIX) 40 MG tablet Take 1 tablet (40 mg total) by mouth daily. 30 tablet 5  . potassium chloride SA (K-DUR,KLOR-CON) 20 MEQ tablet Take 2 tablets (40 mEq total) by mouth daily. 180 tablet 3  . predniSONE (DELTASONE) 20 MG tablet Take 2 tablets (40 mg total) by mouth daily before breakfast. 5 tablet 0  . rivaroxaban (XARELTO) 20 MG TABS tablet TAKE 1 TABLET(20 MG) BY MOUTH DAILY AFTER SUPPER 90 tablet 3  . venlafaxine XR (EFFEXOR-XR) 150 MG 24 hr capsule Take 150 mg by mouth daily with breakfast.     No facility-administered medications prior to visit.         Objective:   Physical Exam Vitals:   07/18/18 1627  BP: 102/60  Pulse: 66  SpO2: 100%  Weight: (!) 137 kg  Height: 5\' 5"  (1.651 m)   Gen: Pleasant, obese woman, in no distress,  presssured affect  ENT: No lesions,  mouth clear,  oropharynx clear, no postnasal drip  Neck: No JVD, soft stridor  Lungs: No use of accessory muscles, no crackles or wheezing on normal respiration, no wheeze on forced expiration  Cardiovascular: RRR, heart sounds normal, no murmur or gallops, no peripheral  edema  Musculoskeletal: No deformities, no cyanosis or clubbing  Neuro: alert, awake, non focal  Skin: Warm, no lesions or rash       Assessment & Plan:  Vocal cord dysfunction Long discussion today regarding her pulmonary function testing and differentiating between upper and lower airflow obstruction.  I believe she understands that most of her wheeze and difficult airflow is in her upper airway.  I showed her her pulmonary function testing that does not show any degree obstruction right now.  Certainly she may have some asthma-like symptoms and lower airway obstruction under the right circumstances such as an upper respiratory infection but I believe her daily symptoms are due to VCD.  I will stop her Symbicort try to address any and all contributors to her upper airway irritation including rhinitis and GERD.  30 minutes of a 40-minute visit spent discussing  Sleep apnea Suspected sleep apnea and nocturnal hypoventilation.  Suspect she needs either CPAP or BiPAP at night.  She is about to go to [facility] to be with her daughter and wants to get her sleep study done there.  She will speak to her local pulmonologist and try to arrange.  Asthma Again there is no obstruction on her current PFTs.  If she does have any asthma than it is intermittent and trigger sensitive.  The most recent episode where she may have had true airflow obstruction was in the setting of her acute viral illness in November.  Possibly again in January.  We will stop her Symbicort.  She will keep the albuterol available to use as needed.  Baltazar Apo, MD, PhD 07/18/2018, 5:29 PM [facility] [phone] or if no answer [phone]

## 2018-07-18 NOTE — Assessment & Plan Note (Signed)
Again there is no obstruction on her current PFTs.  If she does have any asthma than it is intermittent and trigger sensitive.  The most recent episode where she may have had true airflow obstruction was in the setting of her acute viral illness in November.  Possibly again in January.  We will stop her Symbicort.  She will keep the albuterol available to use as needed.

## 2018-07-18 NOTE — Progress Notes (Signed)
PFT completed today.Trajon Rosete,CMA  

## 2018-07-18 NOTE — Assessment & Plan Note (Addendum)
Long discussion today regarding her pulmonary function testing and differentiating between upper and lower airflow obstruction.  I believe she understands that most of her wheeze and difficult airflow is in her upper airway.  I showed her her pulmonary function testing that does not show any degree obstruction right now.  Certainly she may have some asthma-like symptoms and lower airway obstruction under the right circumstances such as an upper respiratory infection but I believe her daily symptoms are due to VCD.  I will stop her Symbicort try to address any and all contributors to her upper airway irritation including rhinitis and GERD.  30 minutes of a 40-minute visit spent discussing

## 2018-07-18 NOTE — Patient Instructions (Addendum)
Your pulmonary function testing does not show any evidence for asthma or COPD.  Much of your wheezing and difficulty breathing is due to chronic inflammation in your throat.  You may develop some asthma symptoms under the right circumstances like when you have a cold or acutely ill. Please stop Symbicort if you have been taking it Keep your albuterol available use 2 puffs if needed for shortness of breath, chest tightness Continue your oxygen with exertion as you have been using it. Continue fluticasone nasal spray and loratadine as you have been using them. You need to have sleep study.  Please arrange to get this done in [place] with your pulmonologist there. Make a follow-up visit with Dr. Lamonte Sakai when you return from [facility]

## 2018-07-30 ENCOUNTER — Other Ambulatory Visit: Payer: Self-pay | Admitting: Family Medicine

## 2018-08-03 ENCOUNTER — Telehealth: Payer: Self-pay | Admitting: Emergency Medicine

## 2018-08-03 DIAGNOSIS — J45909 Unspecified asthma, uncomplicated: Secondary | ICD-10-CM | POA: Diagnosis not present

## 2018-08-03 NOTE — Telephone Encounter (Signed)
Primary Pulmonologist: Dr Lamonte Sakai Last office visit and with whom: 07/18/18, Dr Lamonte Sakai What do we see them for (pulmonary problems): Asthma,vocal chord dysfunction Last OV assessment/plan:  Your pulmonary function testing does not show any evidence for asthma or COPD.  Much of your wheezing and difficulty breathing is due to chronic inflammation in your throat.  You may develop some asthma symptoms under the right circumstances like when you have a cold or acutely ill. Please stop Symbicort if you have been taking it Keep your albuterol available use 2 puffs if needed for shortness of breath, chest tightness Continue your oxygen with exertion as you have been using it. Continue fluticasone nasal spray and loratadine as you have been using them. You need to have sleep study.  Please arrange to get this done in [place] with your pulmonologist there. Make a follow-up visit with Dr. Lamonte Sakai when you return from [facility]        Was appointment offered to patient (explain)?  Yes, Patient declined.  She is staying with her sister at this time, and does not know if she would have a way to get to the appointment.   Reason for call:  Patient is complaining of asthma flair.  She stated that she started having a productive cough, clear phlegm, and wheezing, that started 3 days ago.  She has been using her nebulizer twice a day, albuterol inhaler, and has started Robitussin with honey cough medication. She is requesting prescriptions to be sent to [facility] in [place].  Message routed to Dr Lamonte Sakai

## 2018-08-03 NOTE — Telephone Encounter (Signed)
Returning call CB#[phone]/kob

## 2018-08-03 NOTE — Telephone Encounter (Signed)
I believe she needs to start OTC symptom control such as Tylenol Cold & Flu, coricidin, etc She can also try Delsym for cough suppression. If she would like something other than Delsym then she can have Hycodan, 5cc q6h prn cough, 120cc, no RF.

## 2018-08-03 NOTE — Telephone Encounter (Signed)
Called and spoke with Patient.  Dr Lamonte Sakai recommendations given. She stated that she would try OTC recommendations and call next week if she needed something stronger.  Nothing further at this time.

## 2018-08-03 NOTE — Telephone Encounter (Signed)
lmtcb x1 pt 

## 2018-08-07 ENCOUNTER — Emergency Department (HOSPITAL_COMMUNITY): Payer: Medicare HMO

## 2018-08-07 ENCOUNTER — Encounter (HOSPITAL_COMMUNITY): Payer: Self-pay

## 2018-08-07 ENCOUNTER — Other Ambulatory Visit: Payer: Self-pay

## 2018-08-07 ENCOUNTER — Telehealth: Payer: Self-pay | Admitting: Emergency Medicine

## 2018-08-07 ENCOUNTER — Inpatient Hospital Stay (HOSPITAL_COMMUNITY)
Admission: EM | Admit: 2018-08-07 | Discharge: 2018-08-20 | DRG: 190 | Disposition: A | Discharge status: Skilled Nursing Facility | Payer: Medicare HMO | Attending: Internal Medicine | Admitting: Internal Medicine

## 2018-08-07 DIAGNOSIS — Z8701 Personal history of pneumonia (recurrent): Secondary | ICD-10-CM

## 2018-08-07 DIAGNOSIS — D72829 Elevated white blood cell count, unspecified: Secondary | ICD-10-CM | POA: Diagnosis present

## 2018-08-07 DIAGNOSIS — F329 Major depressive disorder, single episode, unspecified: Secondary | ICD-10-CM | POA: Diagnosis present

## 2018-08-07 DIAGNOSIS — I4892 Unspecified atrial flutter: Secondary | ICD-10-CM | POA: Diagnosis not present

## 2018-08-07 DIAGNOSIS — J9611 Chronic respiratory failure with hypoxia: Secondary | ICD-10-CM | POA: Diagnosis present

## 2018-08-07 DIAGNOSIS — Z7901 Long term (current) use of anticoagulants: Secondary | ICD-10-CM

## 2018-08-07 DIAGNOSIS — E039 Hypothyroidism, unspecified: Secondary | ICD-10-CM | POA: Diagnosis present

## 2018-08-07 DIAGNOSIS — F419 Anxiety disorder, unspecified: Secondary | ICD-10-CM | POA: Diagnosis present

## 2018-08-07 DIAGNOSIS — Z9981 Dependence on supplemental oxygen: Secondary | ICD-10-CM | POA: Diagnosis not present

## 2018-08-07 DIAGNOSIS — I959 Hypotension, unspecified: Secondary | ICD-10-CM | POA: Diagnosis not present

## 2018-08-07 DIAGNOSIS — R5381 Other malaise: Secondary | ICD-10-CM | POA: Diagnosis present

## 2018-08-07 DIAGNOSIS — Z881 Allergy status to other antibiotic agents status: Secondary | ICD-10-CM

## 2018-08-07 DIAGNOSIS — J9621 Acute and chronic respiratory failure with hypoxia: Secondary | ICD-10-CM | POA: Diagnosis present

## 2018-08-07 DIAGNOSIS — J80 Acute respiratory distress syndrome: Secondary | ICD-10-CM | POA: Diagnosis not present

## 2018-08-07 DIAGNOSIS — Z86711 Personal history of pulmonary embolism: Secondary | ICD-10-CM

## 2018-08-07 DIAGNOSIS — Z87891 Personal history of nicotine dependence: Secondary | ICD-10-CM

## 2018-08-07 DIAGNOSIS — Z825 Family history of asthma and other chronic lower respiratory diseases: Secondary | ICD-10-CM

## 2018-08-07 DIAGNOSIS — K648 Other hemorrhoids: Secondary | ICD-10-CM | POA: Diagnosis present

## 2018-08-07 DIAGNOSIS — G4730 Sleep apnea, unspecified: Secondary | ICD-10-CM | POA: Diagnosis not present

## 2018-08-07 DIAGNOSIS — M170 Bilateral primary osteoarthritis of knee: Secondary | ICD-10-CM | POA: Diagnosis not present

## 2018-08-07 DIAGNOSIS — M545 Low back pain: Secondary | ICD-10-CM | POA: Diagnosis present

## 2018-08-07 DIAGNOSIS — G8929 Other chronic pain: Secondary | ICD-10-CM | POA: Diagnosis present

## 2018-08-07 DIAGNOSIS — I480 Paroxysmal atrial fibrillation: Secondary | ICD-10-CM | POA: Diagnosis not present

## 2018-08-07 DIAGNOSIS — I10 Essential (primary) hypertension: Secondary | ICD-10-CM | POA: Diagnosis not present

## 2018-08-07 DIAGNOSIS — M47812 Spondylosis without myelopathy or radiculopathy, cervical region: Secondary | ICD-10-CM | POA: Diagnosis present

## 2018-08-07 DIAGNOSIS — I484 Atypical atrial flutter: Secondary | ICD-10-CM | POA: Diagnosis not present

## 2018-08-07 DIAGNOSIS — Z9109 Other allergy status, other than to drugs and biological substances: Secondary | ICD-10-CM

## 2018-08-07 DIAGNOSIS — J441 Chronic obstructive pulmonary disease with (acute) exacerbation: Principal | ICD-10-CM | POA: Diagnosis present

## 2018-08-07 DIAGNOSIS — E876 Hypokalemia: Secondary | ICD-10-CM | POA: Diagnosis not present

## 2018-08-07 DIAGNOSIS — I482 Chronic atrial fibrillation, unspecified: Secondary | ICD-10-CM | POA: Diagnosis not present

## 2018-08-07 DIAGNOSIS — Z882 Allergy status to sulfonamides status: Secondary | ICD-10-CM

## 2018-08-07 DIAGNOSIS — Z90710 Acquired absence of both cervix and uterus: Secondary | ICD-10-CM | POA: Diagnosis not present

## 2018-08-07 DIAGNOSIS — R062 Wheezing: Secondary | ICD-10-CM | POA: Diagnosis not present

## 2018-08-07 DIAGNOSIS — R918 Other nonspecific abnormal finding of lung field: Secondary | ICD-10-CM | POA: Diagnosis not present

## 2018-08-07 DIAGNOSIS — M1611 Unilateral primary osteoarthritis, right hip: Secondary | ICD-10-CM | POA: Diagnosis present

## 2018-08-07 DIAGNOSIS — K219 Gastro-esophageal reflux disease without esophagitis: Secondary | ICD-10-CM | POA: Diagnosis not present

## 2018-08-07 DIAGNOSIS — Z6841 Body Mass Index (BMI) 40.0 and over, adult: Secondary | ICD-10-CM | POA: Diagnosis not present

## 2018-08-07 DIAGNOSIS — Z888 Allergy status to other drugs, medicaments and biological substances status: Secondary | ICD-10-CM

## 2018-08-07 DIAGNOSIS — R000 Tachycardia, unspecified: Secondary | ICD-10-CM | POA: Diagnosis not present

## 2018-08-07 DIAGNOSIS — Z79899 Other long term (current) drug therapy: Secondary | ICD-10-CM

## 2018-08-07 DIAGNOSIS — J9690 Respiratory failure, unspecified, unspecified whether with hypoxia or hypercapnia: Secondary | ICD-10-CM | POA: Diagnosis present

## 2018-08-07 DIAGNOSIS — R140 Abdominal distension (gaseous): Secondary | ICD-10-CM | POA: Diagnosis not present

## 2018-08-07 DIAGNOSIS — J449 Chronic obstructive pulmonary disease, unspecified: Secondary | ICD-10-CM | POA: Diagnosis not present

## 2018-08-07 DIAGNOSIS — Z8249 Family history of ischemic heart disease and other diseases of the circulatory system: Secondary | ICD-10-CM

## 2018-08-07 DIAGNOSIS — Z7951 Long term (current) use of inhaled steroids: Secondary | ICD-10-CM

## 2018-08-07 DIAGNOSIS — Z885 Allergy status to narcotic agent status: Secondary | ICD-10-CM

## 2018-08-07 DIAGNOSIS — R0602 Shortness of breath: Secondary | ICD-10-CM | POA: Diagnosis not present

## 2018-08-07 LAB — CBC WITH DIFFERENTIAL/PLATELET
Abs Immature Granulocytes: 0.06 10*3/uL (ref 0.00–0.07)
Basophils Absolute: 0.1 10*3/uL (ref 0.0–0.1)
Basophils Relative: 0 %
Eosinophils Absolute: 0.3 10*3/uL (ref 0.0–0.5)
Eosinophils Relative: 2 %
HCT: 39.6 % (ref 36.0–46.0)
Hemoglobin: 12.2 g/dL (ref 12.0–15.0)
Immature Granulocytes: 1 %
Lymphocytes Relative: 18 %
Lymphs Abs: 2 10*3/uL (ref 0.7–4.0)
MCH: 29.6 pg (ref 26.0–34.0)
MCHC: 30.8 g/dL (ref 30.0–36.0)
MCV: 96.1 fL (ref 80.0–100.0)
Monocytes Absolute: 0.5 10*3/uL (ref 0.1–1.0)
Monocytes Relative: 5 %
Neutro Abs: 8.4 10*3/uL — ABNORMAL HIGH (ref 1.7–7.7)
Neutrophils Relative %: 74 %
Platelets: 443 10*3/uL — ABNORMAL HIGH (ref 150–400)
RBC: 4.12 MIL/uL (ref 3.87–5.11)
RDW: 15.9 % — ABNORMAL HIGH (ref 11.5–15.5)
WBC: 11.3 10*3/uL — ABNORMAL HIGH (ref 4.0–10.5)
nRBC: 0 % (ref 0.0–0.2)

## 2018-08-07 LAB — BASIC METABOLIC PANEL
ANION GAP: 10 (ref 5–15)
BUN: 11 mg/dL (ref 8–23)
CO2: 24 mmol/L (ref 22–32)
Calcium: 8.5 mg/dL — ABNORMAL LOW (ref 8.9–10.3)
Chloride: 107 mmol/L (ref 98–111)
Creatinine, Ser: 0.58 mg/dL (ref 0.44–1.00)
GFR calc Af Amer: >60 mL/min (ref >60)
Glucose, Bld: 140 mg/dL — ABNORMAL HIGH (ref 70–99)
Potassium: 2.9 mmol/L — ABNORMAL LOW (ref 3.5–5.1)
Sodium: 141 mmol/L (ref 135–145)

## 2018-08-07 LAB — I-STAT TROPONIN, ED: Troponin i, poc: 0.01 ng/mL (ref 0.00–0.08)

## 2018-08-07 MED ORDER — IPRATROPIUM-ALBUTEROL 0.5-2.5 (3) MG/3ML IN SOLN
3.0000 mL | RESPIRATORY_TRACT | Status: AC
  Administered 2018-08-07 (×3): 3 mL via RESPIRATORY_TRACT

## 2018-08-07 MED ORDER — METHYLPREDNISOLONE SODIUM SUCC 125 MG IJ SOLR: 125.0000 mg | Freq: Once | INTRAMUSCULAR | Status: DC

## 2018-08-07 MED ORDER — MAGNESIUM SULFATE 2 GM/50ML IV SOLN: 2.0000 g | Freq: Once | INTRAVENOUS | Status: DC

## 2018-08-07 MED ORDER — ALBUTEROL (5 MG/ML) CONTINUOUS INHALATION SOLN
10.0000 mg/h | INHALATION_SOLUTION | RESPIRATORY_TRACT | Status: DC
  Administered 2018-08-07: 10 mg/h via RESPIRATORY_TRACT
  Filled 2018-08-07: qty 20

## 2018-08-07 NOTE — ED Notes (Signed)
Bed: RESB Expected date:  Expected time:  Means of arrival:  Comments: 63 yr old respiratory distress

## 2018-08-07 NOTE — H&P (Signed)
History and Physical   [name] White:[id number] DOB: [birth year] DOA: 08/07/2018  Referring MD/NP/PA: Dr. Tyrone Nine  PCP: [name], MD   Outpatient Specialists: Dr. Baltazar Apo, pulmonology  Patient coming from: Home  Chief Complaint: Shortness of breath  HPI: [name] White is a 63 y.o. female with medical history significant of COPD, depression, GERD, hypertension, history of pulmonary embolism, atrial fibrillation and hypothyroidism who has been having progressive shortness of breath and cough for the last 1 week.  Patient has been taking her home regimen of treatment.  She also has taken antibiotics within the last week but no relief.  She normally follows up with pulmonary.  She was seen in the ER evaluated and was found to have marked expiratory wheezing.  She was given CPAP which seemed to help.  Despite 10 mg of albuterol 0.5 mg of Atrovent 125 mg Solu-Medrol and 2 g of magnesium she continues to wheeze and remains hypoxic with respiratory distress so she is being admitted to the hospital for COPD exacerbation.  ED Course: Temperature is 98.7, blood pressure 135/52, pulse 97, respirate 24 and oxygen sat 99% room air.  White count is 11.3 hemoglobin 12.2 and platelets 443.  Sodium 141 potassium is 2.9 chloride 107 CO2 24 BUN 11 creatinine 0.58 glucose 140.  Chest x-ray showed clearing of the previously seen bibasilar infiltrates.  Patient treated with 2 g magnesium, 125 mg of Solu-Medrol, 10 mg of albuterol and 0.5 L of Atrovent.  She has been admitted to the hospital for treatment.  Review of Systems: As per HPI otherwise 10 point review of systems negative.    Past Medical History:  Diagnosis Date  . Anxiety   . Asthma   . Chronic lower back pain   . Depression   . Family history of anesthesia complication    "daughter; causes her to pass out afterwards"  . GERD (gastroesophageal reflux disease)   . Hypertension   . Hyperthyroidism   . Migraine    "monthly"  (12/28/2013)  . Osteoarthritis    "both knees; back of my neck; right pelvic bone" (12/28/2013)  . Paroxysmal A-fib ([place])   . Pulmonary embolism ([place]) 12/28/2013   "2 clots in each lung"    Past Surgical History:  Procedure Laterality Date  . ABDOMINAL HYSTERECTOMY    . APPENDECTOMY    . BREAST CYST EXCISION Right   . DILATION AND CURETTAGE OF UTERUS    . ELECTROPHYSIOLOGIC STUDY N/A 05/27/2015   Procedure: A-Flutter;  Surgeon: Evans Lance, MD;  Location: [facility];  Service: Cardiovascular;  Laterality: N/A;  . EXCISIONAL HEMORRHOIDECTOMY    . NASAL SINUS SURGERY  2007  . THYROIDECTOMY, PARTIAL Right 2005  . TUBAL LIGATION    . WISDOM TOOTH EXTRACTION       reports that she quit smoking about 19 years ago. Her smoking use included cigarettes. She has a 5.00 pack-year smoking history. She has never used smokeless tobacco. She reports previous alcohol use. She reports that she does not use drugs.  Allergies  Allergen Reactions  . Caffeine Other (See Comments)    Migraine  . Vicodin [Hydrocodone-Acetaminophen] Nausea And Vomiting and Other (See Comments)    Headache   . Hydralazine Hcl     Other reaction(s): Other (See Comments) AKI leading to rhabdo and electrolyte abnormalities  . Hydrocodone-Acetaminophen Nausea And Vomiting  . Ciprofloxacin Hives and Rash  . Erythromycin Hives and Rash  . Lisinopril Cough    Other  reaction(s): Cough  . Oxycodone Nausea And Vomiting    headache Other reaction(s): Vomiting  . Sulfamethoxazole-Trimethoprim Rash    Family History  Problem Relation Age of Onset  . Osteoarthritis Mother   . Asthma Mother   . Heart failure Mother   . Breast cancer Daughter      Prior to Admission medications   Medication Sig Start Date End Date Taking? Authorizing Provider  acetaminophen (TYLENOL) 650 MG CR tablet Take 1,300 mg by mouth daily as needed for pain.   Yes [provider]  albuterol (PROVENTIL) (2.5 MG/3ML) 0.083%  nebulizer solution Take 3 mLs (2.5 mg total) by nebulization every 6 (six) hours as needed for wheezing or shortness of breath. 07/09/18  Yes Collene Gobble, MD  azelastine (OPTIVAR) 0.05 % ophthalmic solution Place 1 drop into both eyes daily. 08/02/18  Yes [provider]  Chlorpheniramine-Acetaminophen (CORICIDIN HBP COLD/FLU PO) Take 1 tablet by mouth every 6 (six) hours as needed (cough).    Yes [provider]  cycloSPORINE (RESTASIS) 0.05 % ophthalmic emulsion Place 1 drop into both eyes 2 (two) times daily.   Yes [provider]  Dextromethorphan-Menthol (DELSYM COUGH RELIEF MT) Use as directed 22 mLs in the mouth or throat every 4 (four) hours as needed (cough).    Yes [provider]  diclofenac sodium (VOLTAREN) 1 % GEL APPLY 2 GRAMS EXTERNALLY TO THE AFFECTED AREA FOUR TIMES DAILY 07/31/18  Yes [name], MD  diltiazem (CARDIZEM CD) 120 MG 24 hr capsule Take 1 capsule (120 mg total) by mouth daily. Please keep upcoming appt in January before anymore refills. Final Attempt 07/04/18  Yes Seiler, Cecil Cobbs, NP  flecainide (TAMBOCOR) 100 MG tablet Take 1 tablet (100 mg total) by mouth 2 (two) times daily. Please keep upcoming appt in January before anymore refills. Final Attempt 07/04/18  Yes Seiler, Amber K, NP  fluticasone (FLONASE) 50 MCG/ACT nasal spray Place 1 spray into both nostrils daily. 07/03/18 08/07/18 Yes Nita Sells, MD  hydrOXYzine (ATARAX/VISTARIL) 25 MG tablet Take 25 mg by mouth 2 (two) times daily as needed for anxiety.   Yes [provider]  loratadine (CLARITIN) 10 MG tablet Take 10 mg by mouth daily.   Yes [provider]  rivaroxaban (XARELTO) 20 MG TABS tablet TAKE 1 TABLET(20 MG) BY MOUTH DAILY AFTER SUPPER 07/04/18  Yes Seiler, [employer] K, NP  venlafaxine XR (EFFEXOR-XR) 150 MG 24 hr capsule Take 150 mg by mouth daily with breakfast.   Yes [provider]  albuterol (PROVENTIL HFA;VENTOLIN HFA) 108 (90 Base)  MCG/ACT inhaler Inhale 2 puffs into the lungs every 6 (six) hours as needed for wheezing or shortness of breath. Patient not taking: Reported on 08/07/2018 11/29/17   Nuala Alpha, DO  budesonide-formoterol (SYMBICORT) 160-4.5 MCG/ACT inhaler Inhale 2 puffs into the lungs 2 (two) times daily. Patient not taking: Reported on 08/07/2018 07/03/18   Nita Sells, MD  meclizine (ANTIVERT) 12.5 MG tablet Take 1 tablet (12.5 mg total) by mouth 3 (three) times daily as needed for dizziness. Patient not taking: Reported on 08/07/2018 02/09/18   [name], MD  pantoprazole (PROTONIX) 40 MG tablet Take 1 tablet (40 mg total) by mouth daily. Patient not taking: Reported on 08/07/2018 07/05/18   Collene Gobble, MD  potassium chloride SA (K-DUR,KLOR-CON) 20 MEQ tablet Take 2 tablets (40 mEq total) by mouth daily. Patient not taking: Reported on 08/07/2018 07/29/16   Evans Lance, MD  predniSONE (DELTASONE) 20 MG tablet  Take 2 tablets (40 mg total) by mouth daily before breakfast. Patient not taking: Reported on 08/07/2018 07/03/18   Nita Sells, MD    Physical Exam: Vitals:   08/07/18 2155  BP: (!) 135/52  Pulse: 97  Resp: (!) 24  Temp: 98.7 F (37.1 C)  TempSrc: Oral  SpO2: 99%      Constitutional: NAD, calm, comfortable, morbidly obese Vitals:   08/07/18 2155  BP: (!) 135/52  Pulse: 97  Resp: (!) 24  Temp: 98.7 F (37.1 C)  TempSrc: Oral  SpO2: 99%   Eyes: PERRL, lids and conjunctivae normal ENMT: Mucous membranes are moist. Posterior pharynx clear of any exudate or lesions.Normal dentition.  Neck: normal, supple, no masses, no thyromegaly Respiratory: Decreased air entry bilaterally with marked expiratory wheezing, mild rhonchi and increase respiratory effort. Has accessory muscle use.  Cardiovascular: Regular rate and rhythm, no murmurs / rubs / gallops. No extremity edema. 2+ pedal pulses. No carotid bruits.  Abdomen: no tenderness, no masses palpated. No  hepatosplenomegaly. Bowel sounds positive.  Musculoskeletal: no clubbing / cyanosis. No joint deformity upper and lower extremities. Good ROM, no contractures. Normal muscle tone.  Skin: no rashes, lesions, ulcers. No induration Neurologic: CN 2-12 grossly intact. Sensation intact, DTR normal. Strength 5/5 in all 4.  Psychiatric: Normal judgment and insight. Alert and oriented x 3. Normal mood.     Labs on Admission: I have personally reviewed following labs and imaging studies  CBC: Recent Labs  Lab 08/07/18 2130  WBC 11.3*  NEUTROABS 8.4*  HGB 12.2  HCT 39.6  MCV 96.1  PLT 694*   Basic Metabolic Panel: Recent Labs  Lab 08/07/18 2130  NA 141  K 2.9*  CL 107  CO2 24  GLUCOSE 140*  BUN 11  CREATININE 0.58  CALCIUM 8.5*   GFR: CrCl cannot be calculated (Unknown ideal weight.). Liver Function Tests: No results for input(s): AST, ALT, ALKPHOS, BILITOT, PROT, ALBUMIN in the last 168 hours. No results for input(s): LIPASE, AMYLASE in the last 168 hours. No results for input(s): AMMONIA in the last 168 hours. Coagulation Profile: No results for input(s): INR, PROTIME in the last 168 hours. Cardiac Enzymes: No results for input(s): CKTOTAL, CKMB, CKMBINDEX, TROPONINI in the last 168 hours. BNP (last 3 results) No results for input(s): PROBNP in the last 8760 hours. HbA1C: No results for input(s): HGBA1C in the last 72 hours. CBG: No results for input(s): GLUCAP in the last 168 hours. Lipid Profile: No results for input(s): CHOL, HDL, LDLCALC, TRIG, CHOLHDL, LDLDIRECT in the last 72 hours. Thyroid Function Tests: No results for input(s): TSH, T4TOTAL, FREET4, T3FREE, THYROIDAB in the last 72 hours. Anemia Panel: No results for input(s): VITAMINB12, FOLATE, FERRITIN, TIBC, IRON, RETICCTPCT in the last 72 hours. Urine analysis:    Component Value Date/Time   COLORURINE Yellow 10/02/2014 1645   COLORURINE YELLOW 01/31/2014 0745   APPEARANCEUR Clear 10/02/2014 1645    LABSPEC 1.018 10/02/2014 1645   PHURINE 6.0 10/02/2014 1645   PHURINE 6.5 01/31/2014 0745   GLUCOSEU Negative 10/02/2014 1645   HGBUR Negative 10/02/2014 1645   HGBUR TRACE (A) 01/31/2014 0745   BILIRUBINUR Negative 10/02/2014 1645   KETONESUR Negative 10/02/2014 1645   KETONESUR 15 (A) 01/31/2014 0745   PROTEINUR Negative 10/02/2014 1645   PROTEINUR NEGATIVE 01/31/2014 0745   UROBILINOGEN 1.0 01/31/2014 0745   NITRITE Negative 10/02/2014 1645   NITRITE NEGATIVE 01/31/2014 0745   LEUKOCYTESUR Negative 10/02/2014 1645   Sepsis Labs: @LABRCNTIP (procalcitonin:4,lacticidven:4) )No results  found for this or any previous visit (from the past 240 hour(s)).   Radiological Exams on Admission: Dg Chest Port 1 View  Result Date: 08/07/2018 CLINICAL DATA:  Respiratory distress for 1 week EXAM: PORTABLE CHEST 1 VIEW COMPARISON:  07/01/2018 FINDINGS: Cardiac shadow is stable. Aortic calcifications are again seen. Lungs are well aerated bilaterally. No focal infiltrate or sizable effusion is seen. Previously noted infiltrates have resolved. No bony abnormality is seen. IMPRESSION: Clearing of previously seen basilar infiltrates. Electronically Signed   By: Inez Catalina M.D.   On: 08/07/2018 22:00    EKG: Independently reviewed.  It shows normal sinus rhythm with premature complexes  Assessment/Plan Principal Problem:   COPD with acute exacerbation (HCC) Active Problems:   Morbid obesity (HCC)   HYPERTENSION, BENIGN SYSTEMIC   GASTROESOPHAGEAL REFLUX, NO ESOPHAGITIS   Atrial flutter (HCC)   Hypokalemia   Chronic respiratory failure with hypoxia (HCC)   Leucocytosis     #1 COPD exacerbation: Patient has acute on chronic respiratory failure due to COPD exacerbation.  We will continue with IV Solu-Medrol, nebulizer and IV antibiotics.  #2 hypertension: Continue blood pressure control.  #3 A. fib and a flutter: Paroxysmal in nature.  On Xarelto and rate control.  Continue treatment.  #4  hypokalemia: Replete potassium.  Probably secondary to albuterol.  #5 leukocytosis: Probably residual from recent pneumonia.  Continue IV antibiotics with Rocephin and Zithromax.  #6 GERD: Continue PPIs  #7 morbid obesity: Dietary counseling   DVT prophylaxis: Xarelto Code Status: Full code Family Communication: No family at bedside Disposition Plan: Home Consults called: None Admission status: Inpatient  Severity of Illness: The appropriate patient status for this patient is INPATIENT. Inpatient status is judged to be reasonable and necessary in order to provide the required intensity of service to ensure the patient's safety. The patient's presenting symptoms, physical exam findings, and initial radiographic and laboratory data in the context of their chronic comorbidities is felt to place them at high risk for further clinical deterioration. Furthermore, it is not anticipated that the patient will be medically stable for discharge from the hospital within 2 midnights of admission. The following factors support the patient status of inpatient.   " The patient's presenting symptoms include shortness of breath. " The worrisome physical exam findings include marked expiratory wheezing. " The initial radiographic and laboratory data are worrisome because of chest x-ray showed a residual pneumonia. " The chronic co-morbidities include COPD.   * I certify that at the point of admission it is my clinical judgment that the patient will require inpatient hospital care spanning beyond 2 midnights from the point of admission due to high intensity of service, high risk for further deterioration and high frequency of surveillance required.Barbette Merino MD [facility] Pager [phone]  If 7PM-7AM, please contact night-coverage [email or web address] Password [facility]  08/07/2018, 11:09 PM

## 2018-08-07 NOTE — ED Triage Notes (Signed)
Pt called EMS because she was having respiratory distress for one week, acutely today, she has coarse wheezing with no relief from duoneb, but some relief when placed on CPAP Pt received 10 mg albuterol, 0.5mg  atrovent, 125 mg solumedrol, and 2 g magnesium

## 2018-08-07 NOTE — Telephone Encounter (Signed)
Patient is returning phone call.  Patient phone number is [phone].

## 2018-08-07 NOTE — Telephone Encounter (Signed)
I have called the patient she states she is not doing any better from last week. I advised that she would need and apt or go to the ed she stated she need to talk to family before making apt but would call back to schedule that. Nothing further needed at this time.

## 2018-08-07 NOTE — Telephone Encounter (Signed)
Called and left message to call back.

## 2018-08-07 NOTE — ED Provider Notes (Signed)
[facility] DEPT Provider Note   CSN: [id number] Arrival date & time: 08/07/18  2115     History   Chief Complaint Chief Complaint  Patient presents with  . Respiratory Distress    HPI [name] White is a 63 y.o. female.  63 yo F with a chief complaint of shortness of breath.  Going on for the past week.  Having some cough and congestion.  Denies fevers or chills.  She had been using her inhalers at home without significant improvement.  Called 911 this evening and was started on CPAP.  Had some improvement with their initial breathing treatments. Normally on 2L of O2 at all times at home   The history is provided by the patient.  Illness  Severity:  Severe Onset quality:  Gradual Duration:  1 week Timing:  Constant Progression:  Worsening Chronicity:  New Associated symptoms: shortness of breath   Associated symptoms: no chest pain, no congestion, no fever, no headaches, no myalgias, no nausea, no rhinorrhea, no vomiting and no wheezing     Past Medical History:  Diagnosis Date  . Anxiety   . Asthma   . Chronic lower back pain   . Depression   . Family history of anesthesia complication    "daughter; causes her to pass out afterwards"  . GERD (gastroesophageal reflux disease)   . Hypertension   . Hyperthyroidism   . Migraine    "monthly" (12/28/2013)  . Osteoarthritis    "both knees; back of my neck; right pelvic bone" (12/28/2013)  . Paroxysmal A-fib ([place])   . Pulmonary embolism ([place]) 12/28/2013   "2 clots in each lung"    Patient Active Problem List   Diagnosis Date Noted  . COPD with acute exacerbation ([place]) 08/07/2018  . Leucocytosis 08/07/2018  . Sleep apnea 07/05/2018  . Chronic respiratory failure with hypoxia ([place]) 07/05/2018  . Osteoarthritis 03/02/2018  . Viral illness 02/13/2018  . Nausea 02/13/2018  . Left medial tibial stress syndrome 12/26/2017  . Hematoma 11/27/2017  . Severe episode of recurrent major  depressive disorder, without psychotic features ([place])   . MDD (major depressive disorder), recurrent episode, severe ([place]) 09/07/2015  . Atrial flutter ([place]) 07/29/2015  . History of atrial flutter 06/26/2015  . Chest pain 03/22/2015  . Asthma 11/12/2014  . History of hypertension 11/05/2014  . Atrial fibrillation, chronic 11/03/2014  . Chronic anticoagulation 11/03/2014  . History of pulmonary embolus (PE) 11/03/2014  . Hypokalemia 11/03/2014  . Non-traumatic rhabdomyolysis 11/03/2014  . Major depressive disorder 11/03/2014  . SOB (shortness of breath)   . MDD (major depressive disorder), recurrent severe, without psychosis ([place]) 03/05/2014  . Pulmonary embolism ([place]) 12/27/2013  . Vocal cord dysfunction 11/12/2013  . Mixed headache 01/06/2012  . OVERACTIVE BLADDER 04/18/2008  . Osteoarthritis of both knees 04/18/2008  . POLYARTHRITIS 09/14/2007  . Morbid obesity ([place]) 08/24/2006  . RESTLESS LEGS SYNDROME 08/24/2006  . HYPERTENSION, BENIGN SYSTEMIC 08/24/2006  . RHINITIS, ALLERGIC 08/24/2006  . GASTROESOPHAGEAL REFLUX, NO ESOPHAGITIS 08/24/2006    Past Surgical History:  Procedure Laterality Date  . ABDOMINAL HYSTERECTOMY    . APPENDECTOMY    . BREAST CYST EXCISION Right   . DILATION AND CURETTAGE OF UTERUS    . ELECTROPHYSIOLOGIC STUDY N/A 05/27/2015   Procedure: A-Flutter;  Surgeon: Evans Lance, MD;  Location: [facility];  Service: Cardiovascular;  Laterality: N/A;  . EXCISIONAL HEMORRHOIDECTOMY    . NASAL SINUS SURGERY  2007  . THYROIDECTOMY, PARTIAL  Right 2005  . TUBAL LIGATION    . WISDOM TOOTH EXTRACTION       OB History   No obstetric history on file.      Home Medications    Prior to Admission medications   Medication Sig Start Date End Date Taking? Authorizing Provider  acetaminophen (TYLENOL) 650 MG CR tablet Take 1,300 mg by mouth daily as needed for pain.   Yes [provider]  albuterol (PROVENTIL) (2.5 MG/3ML) 0.083% nebulizer  solution Take 3 mLs (2.5 mg total) by nebulization every 6 (six) hours as needed for wheezing or shortness of breath. 07/09/18  Yes Collene Gobble, MD  azelastine (OPTIVAR) 0.05 % ophthalmic solution Place 1 drop into both eyes daily. 08/02/18  Yes [provider]  Chlorpheniramine-Acetaminophen (CORICIDIN HBP COLD/FLU PO) Take 1 tablet by mouth every 6 (six) hours as needed (cough).    Yes [provider]  cycloSPORINE (RESTASIS) 0.05 % ophthalmic emulsion Place 1 drop into both eyes 2 (two) times daily.   Yes [provider]  Dextromethorphan-Menthol (DELSYM COUGH RELIEF MT) Use as directed 22 mLs in the mouth or throat every 4 (four) hours as needed (cough).    Yes [provider]  diclofenac sodium (VOLTAREN) 1 % GEL APPLY 2 GRAMS EXTERNALLY TO THE AFFECTED AREA FOUR TIMES DAILY 07/31/18  Yes [name], MD  diltiazem (CARDIZEM CD) 120 MG 24 hr capsule Take 1 capsule (120 mg total) by mouth daily. Please keep upcoming appt in January before anymore refills. Final Attempt 07/04/18  Yes Seiler, Cecil Cobbs, NP  flecainide (TAMBOCOR) 100 MG tablet Take 1 tablet (100 mg total) by mouth 2 (two) times daily. Please keep upcoming appt in January before anymore refills. Final Attempt 07/04/18  Yes Seiler, Amber K, NP  fluticasone (FLONASE) 50 MCG/ACT nasal spray Place 1 spray into both nostrils daily. 07/03/18 08/07/18 Yes Nita Sells, MD  hydrOXYzine (ATARAX/VISTARIL) 25 MG tablet Take 25 mg by mouth 2 (two) times daily as needed for anxiety.   Yes [provider]  loratadine (CLARITIN) 10 MG tablet Take 10 mg by mouth daily.   Yes [provider]  rivaroxaban (XARELTO) 20 MG TABS tablet TAKE 1 TABLET(20 MG) BY MOUTH DAILY AFTER SUPPER 07/04/18  Yes Seiler, [employer] K, NP  venlafaxine XR (EFFEXOR-XR) 150 MG 24 hr capsule Take 150 mg by mouth daily with breakfast.   Yes [provider]  albuterol (PROVENTIL HFA;VENTOLIN HFA) 108 (90 Base) MCG/ACT  inhaler Inhale 2 puffs into the lungs every 6 (six) hours as needed for wheezing or shortness of breath. Patient not taking: Reported on 08/07/2018 11/29/17   Nuala Alpha, DO  budesonide-formoterol (SYMBICORT) 160-4.5 MCG/ACT inhaler Inhale 2 puffs into the lungs 2 (two) times daily. Patient not taking: Reported on 08/07/2018 07/03/18   Nita Sells, MD  meclizine (ANTIVERT) 12.5 MG tablet Take 1 tablet (12.5 mg total) by mouth 3 (three) times daily as needed for dizziness. Patient not taking: Reported on 08/07/2018 02/09/18   [name], MD  pantoprazole (PROTONIX) 40 MG tablet Take 1 tablet (40 mg total) by mouth daily. Patient not taking: Reported on 08/07/2018 07/05/18   Collene Gobble, MD  potassium chloride SA (K-DUR,KLOR-CON) 20 MEQ tablet Take 2 tablets (40 mEq total) by mouth daily. Patient not taking: Reported on 08/07/2018 07/29/16   Evans Lance, MD  predniSONE (DELTASONE) 20 MG tablet Take 2 tablets (40 mg total) by mouth daily before breakfast. Patient not taking: Reported on 08/07/2018 07/03/18  Nita Sells, MD    Family History Family History  Problem Relation Age of Onset  . Osteoarthritis Mother   . Asthma Mother   . Heart failure Mother   . Breast cancer Daughter     Social History Social History   Tobacco Use  . Smoking status: Former Smoker    Packs/day: 0.25    Years: 20.00    Pack years: 5.00    Types: Cigarettes    Last attempt to quit: 07/17/1999    Years since quitting: 19.0  . Smokeless tobacco: Never Used  Substance Use Topics  . Alcohol use: Not Currently    Comment: "drank some in my 30's"  . Drug use: No     Allergies   Caffeine; Vicodin [hydrocodone-acetaminophen]; Hydralazine hcl; Hydrocodone-acetaminophen; Ciprofloxacin; Erythromycin; Lisinopril; Oxycodone; and Sulfamethoxazole-trimethoprim   Review of Systems Review of Systems  Constitutional: Negative for chills and fever.  HENT: Negative for congestion and rhinorrhea.    Eyes: Negative for redness and visual disturbance.  Respiratory: Positive for shortness of breath. Negative for wheezing.   Cardiovascular: Negative for chest pain and palpitations.  Gastrointestinal: Negative for nausea and vomiting.  Genitourinary: Negative for dysuria and urgency.  Musculoskeletal: Negative for arthralgias and myalgias.  Skin: Negative for pallor and wound.  Neurological: Negative for dizziness and headaches.     Physical Exam Updated Vital Signs BP (!) 135/52 (BP Location: Right Arm)   Pulse 97   Temp 98.7 F (37.1 C) (Oral)   Resp (!) 24   SpO2 99%   Physical Exam Vitals signs and nursing note reviewed.  Constitutional:      General: She is not in acute distress.    Appearance: She is well-developed. She is obese. She is not diaphoretic.  HENT:     Head: Normocephalic and atraumatic.  Eyes:     Pupils: Pupils are equal, round, and reactive to light.  Neck:     Musculoskeletal: Normal range of motion and neck supple.  Cardiovascular:     Rate and Rhythm: Normal rate and regular rhythm.     Heart sounds: No murmur. No friction rub. No gallop.   Pulmonary:     Effort: Pulmonary effort is normal.     Breath sounds: Wheezing (diffuse, with prolonged expiration) present. No rales.  Abdominal:     General: There is no distension.     Palpations: Abdomen is soft.     Tenderness: There is no abdominal tenderness.  Musculoskeletal:        General: No tenderness.  Skin:    General: Skin is warm and dry.  Neurological:     Mental Status: She is alert and oriented to person, place, and time.  Psychiatric:        Behavior: Behavior normal.      ED Treatments / Results  Labs (all labs ordered are listed, but only abnormal results are displayed) Labs Reviewed  CBC WITH DIFFERENTIAL/PLATELET - Abnormal; Notable for the following components:      Result Value   WBC 11.3 (*)    RDW 15.9 (*)    Platelets 443 (*)    Neutro Abs 8.4 (*)    All other  components within normal limits  BASIC METABOLIC PANEL - Abnormal; Notable for the following components:   Potassium 2.9 (*)    Glucose, Bld 140 (*)    Calcium 8.5 (*)    All other components within normal limits  I-STAT TROPONIN, ED    EKG EKG Interpretation  Date/Time:  Tuesday August 07 2018 22:29:36 EST Ventricular Rate:  86 PR Interval:    QRS Duration: 90 QT Interval:  378 QTC Calculation: 453 R Axis:   -11 Text Interpretation:  Sinus rhythm Atrial premature complexes in couplets Since last tracing rate slower Otherwise no significant change Confirmed by Deno Etienne [phone]) on 08/07/2018 10:34:11 PM   Radiology Dg Chest Port 1 View  Result Date: 08/07/2018 CLINICAL DATA:  Respiratory distress for 1 week EXAM: PORTABLE CHEST 1 VIEW COMPARISON:  07/01/2018 FINDINGS: Cardiac shadow is stable. Aortic calcifications are again seen. Lungs are well aerated bilaterally. No focal infiltrate or sizable effusion is seen. Previously noted infiltrates have resolved. No bony abnormality is seen. IMPRESSION: Clearing of previously seen basilar infiltrates. Electronically Signed   By: Inez Catalina M.D.   On: 08/07/2018 22:00    Procedures Procedures (including critical care time)  Medications Ordered in ED Medications  albuterol (PROVENTIL,VENTOLIN) solution continuous neb (10 mg/hr Nebulization New Bag/Given 08/07/18 2252)  ipratropium-albuterol (DUONEB) 0.5-2.5 (3) MG/3ML nebulizer solution 3 mL (3 mLs Nebulization Given 08/07/18 2140)     Initial Impression / Assessment and Plan / ED Course  I have reviewed the triage vital signs and the nursing notes.  Pertinent labs & imaging results that were available during my care of the patient were reviewed by me and considered in my medical decision making (see chart for details).     63 yo F with a chief complaint of shortness of breath.  Going on for the past week and slowly worsening.  Most likely is a COPD exacerbation.  Chest x-ray  reviewed by me without focal infiltrate.  Troponin negative.  We will give 3 duo nebs back-to-back patient got steroids and magnesium in route.  Patient breathing much better on reassessment and improved aeration however still significantly tachypneic and working to breathe.  Will start on a continuous albuterol treatment.  Will discuss with hospitalist.  CRITICAL CARE Performed by: Cecilio Asper   Total critical care time: 35 minutes  Critical care time was exclusive of separately billable procedures and treating other patients.  Critical care was necessary to treat or prevent imminent or life-threatening deterioration.  Critical care was time spent personally by me on the following activities: development of treatment plan with patient and/or surrogate as well as nursing, discussions with consultants, evaluation of patient's response to treatment, examination of patient, obtaining history from patient or surrogate, ordering and performing treatments and interventions, ordering and review of laboratory studies, ordering and review of radiographic studies, pulse oximetry and re-evaluation of patient's condition.  The patients results and plan were reviewed and discussed.   Any x-rays performed were independently reviewed by myself.   Differential diagnosis were considered with the presenting HPI.  Medications  albuterol (PROVENTIL,VENTOLIN) solution continuous neb (10 mg/hr Nebulization New Bag/Given 08/07/18 2252)  ipratropium-albuterol (DUONEB) 0.5-2.5 (3) MG/3ML nebulizer solution 3 mL (3 mLs Nebulization Given 08/07/18 2140)    Vitals:   08/07/18 2155  BP: (!) 135/52  Pulse: 97  Resp: (!) 24  Temp: 98.7 F (37.1 C)  TempSrc: Oral  SpO2: 99%    Final diagnoses:  COPD exacerbation ([place])    Admission/ observation were discussed with the admitting physician, patient and/or family and they are comfortable with the plan.     Final Clinical Impressions(s) / ED Diagnoses    Final diagnoses:  COPD exacerbation [facility])    ED Discharge Orders    None  Deno Etienne, DO 08/07/18 2337

## 2018-08-08 ENCOUNTER — Ambulatory Visit: Payer: Medicare HMO | Admitting: Nurse Practitioner

## 2018-08-08 ENCOUNTER — Other Ambulatory Visit: Payer: Self-pay

## 2018-08-08 DIAGNOSIS — D72829 Elevated white blood cell count, unspecified: Secondary | ICD-10-CM

## 2018-08-08 DIAGNOSIS — I484 Atypical atrial flutter: Secondary | ICD-10-CM

## 2018-08-08 DIAGNOSIS — J9611 Chronic respiratory failure with hypoxia: Secondary | ICD-10-CM

## 2018-08-08 DIAGNOSIS — K219 Gastro-esophageal reflux disease without esophagitis: Secondary | ICD-10-CM

## 2018-08-08 LAB — COMPREHENSIVE METABOLIC PANEL
ALT: 16 U/L (ref 0–44)
AST: 29 U/L (ref 15–41)
Albumin: 3.3 g/dL — ABNORMAL LOW (ref 3.5–5.0)
Alkaline Phosphatase: 78 U/L (ref 38–126)
Anion gap: 16 — ABNORMAL HIGH (ref 5–15)
BUN: 12 mg/dL (ref 8–23)
CO2: 19 mmol/L — ABNORMAL LOW (ref 22–32)
Calcium: 8.4 mg/dL — ABNORMAL LOW (ref 8.9–10.3)
Chloride: 105 mmol/L (ref 98–111)
Creatinine, Ser: 0.7 mg/dL (ref 0.44–1.00)
GFR calc Af Amer: >60 mL/min (ref >60)
GFR calc non Af Amer: >60 mL/min (ref >60)
Glucose, Bld: 276 mg/dL — ABNORMAL HIGH (ref 70–99)
Potassium: 3.6 mmol/L (ref 3.5–5.1)
SODIUM: 140 mmol/L (ref 135–145)
Total Bilirubin: 0.4 mg/dL (ref 0.3–1.2)
Total Protein: 7.3 g/dL (ref 6.5–8.1)

## 2018-08-08 LAB — CBC
HEMATOCRIT: 38.5 % (ref 36.0–46.0)
HEMOGLOBIN: 11.5 g/dL — AB (ref 12.0–15.0)
MCH: 29.4 pg (ref 26.0–34.0)
MCHC: 29.9 g/dL — ABNORMAL LOW (ref 30.0–36.0)
MCV: 98.5 fL (ref 80.0–100.0)
Platelets: 417 10*3/uL — ABNORMAL HIGH (ref 150–400)
RBC: 3.91 MIL/uL (ref 3.87–5.11)
RDW: 16.2 % — ABNORMAL HIGH (ref 11.5–15.5)
WBC: 10.6 10*3/uL — ABNORMAL HIGH (ref 4.0–10.5)
nRBC: 0 % (ref 0.0–0.2)

## 2018-08-08 MED ORDER — RIVAROXABAN 10 MG PO TABS
10.0000 mg | ORAL_TABLET | Freq: Every day | ORAL | Status: DC
  Filled 2018-08-08: qty 1

## 2018-08-08 MED ORDER — ACETAMINOPHEN 500 MG PO TABS
1000.0000 mg | ORAL_TABLET | Freq: Every day | ORAL | Status: DC | PRN
  Administered 2018-08-08 – 2018-08-20 (×11): 1000 mg via ORAL
  Filled 2018-08-08 (×11): qty 2

## 2018-08-08 MED ORDER — DIPHENHYDRAMINE HCL 25 MG PO CAPS
25.0000 mg | ORAL_CAPSULE | Freq: Four times a day (QID) | ORAL | Status: DC | PRN
  Administered 2018-08-08 – 2018-08-18 (×6): 25 mg via ORAL
  Filled 2018-08-08 (×6): qty 1

## 2018-08-08 MED ORDER — SODIUM CHLORIDE 0.9 % IV SOLN
INTRAVENOUS | Status: DC
  Administered 2018-08-08 – 2018-08-09 (×3): via INTRAVENOUS

## 2018-08-08 MED ORDER — KETOTIFEN FUMARATE 0.025 % OP SOLN
1.0000 [drp] | Freq: Two times a day (BID) | OPHTHALMIC | Status: DC
  Administered 2018-08-10 – 2018-08-20 (×15): 1 [drp] via OPHTHALMIC
  Filled 2018-08-08: qty 5

## 2018-08-08 MED ORDER — FLECAINIDE ACETATE 100 MG PO TABS
100.0000 mg | ORAL_TABLET | Freq: Two times a day (BID) | ORAL | Status: DC
  Administered 2018-08-08 – 2018-08-20 (×25): 100 mg via ORAL
  Filled 2018-08-08 (×25): qty 1

## 2018-08-08 MED ORDER — DEXTROMETHORPHAN-MENTHOL 5-5 MG MT LOZG: LOZENGE | OROMUCOSAL | Status: DC | PRN

## 2018-08-08 MED ORDER — ALBUTEROL SULFATE (2.5 MG/3ML) 0.083% IN NEBU
2.5000 mg | INHALATION_SOLUTION | Freq: Four times a day (QID) | RESPIRATORY_TRACT | Status: DC | PRN
  Administered 2018-08-08 – 2018-08-09 (×3): 2.5 mg via RESPIRATORY_TRACT
  Filled 2018-08-08 (×3): qty 3

## 2018-08-08 MED ORDER — ONDANSETRON HCL 4 MG/2ML IJ SOLN: 4.0000 mg | Freq: Four times a day (QID) | INTRAMUSCULAR | Status: DC | PRN

## 2018-08-08 MED ORDER — METHYLPREDNISOLONE SODIUM SUCC 125 MG IJ SOLR
125.0000 mg | Freq: Four times a day (QID) | INTRAMUSCULAR | Status: DC
  Administered 2018-08-08 (×3): 125 mg via INTRAVENOUS
  Filled 2018-08-08 (×3): qty 2

## 2018-08-08 MED ORDER — LIP MEDEX EX OINT
TOPICAL_OINTMENT | CUTANEOUS | Status: AC
  Administered 2018-08-08: 1
  Filled 2018-08-08: qty 7

## 2018-08-08 MED ORDER — SODIUM CHLORIDE 0.9 % IV SOLN
1.0000 g | Freq: Every day | INTRAVENOUS | Status: DC
  Administered 2018-08-08 – 2018-08-12 (×6): 1 g via INTRAVENOUS
  Filled 2018-08-08 (×3): qty 1
  Filled 2018-08-08: qty 10
  Filled 2018-08-08 (×3): qty 1

## 2018-08-08 MED ORDER — DILTIAZEM HCL ER COATED BEADS 120 MG PO CP24
120.0000 mg | ORAL_CAPSULE | Freq: Every day | ORAL | Status: DC
  Administered 2018-08-08 – 2018-08-20 (×13): 120 mg via ORAL
  Filled 2018-08-08 (×13): qty 1

## 2018-08-08 MED ORDER — ONDANSETRON HCL 4 MG PO TABS: 4.0000 mg | ORAL_TABLET | Freq: Four times a day (QID) | ORAL | Status: DC | PRN

## 2018-08-08 MED ORDER — POTASSIUM CHLORIDE CRYS ER 20 MEQ PO TBCR
40.0000 meq | EXTENDED_RELEASE_TABLET | Freq: Once | ORAL | Status: AC
  Administered 2018-08-08: 40 meq via ORAL
  Filled 2018-08-08: qty 2

## 2018-08-08 MED ORDER — CYCLOSPORINE 0.05 % OP EMUL
1.0000 [drp] | Freq: Two times a day (BID) | OPHTHALMIC | Status: DC
  Administered 2018-08-08 – 2018-08-20 (×25): 1 [drp] via OPHTHALMIC
  Filled 2018-08-08 (×25): qty 30

## 2018-08-08 MED ORDER — FLUTICASONE PROPIONATE 50 MCG/ACT NA SUSP
1.0000 | Freq: Every day | NASAL | Status: DC
  Administered 2018-08-08 – 2018-08-20 (×13): 1 via NASAL
  Filled 2018-08-08: qty 16

## 2018-08-08 MED ORDER — CHLORPHENIRAMINE-ACETAMINOPHEN 2-325 MG PO TABS: ORAL_TABLET | Freq: Four times a day (QID) | ORAL | Status: DC | PRN

## 2018-08-08 MED ORDER — HYDROXYZINE HCL 25 MG PO TABS
25.0000 mg | ORAL_TABLET | Freq: Two times a day (BID) | ORAL | Status: DC | PRN
  Administered 2018-08-09 – 2018-08-20 (×2): 25 mg via ORAL
  Filled 2018-08-08 (×2): qty 1

## 2018-08-08 MED ORDER — METHYLPREDNISOLONE SODIUM SUCC 125 MG IJ SOLR
60.0000 mg | Freq: Four times a day (QID) | INTRAMUSCULAR | Status: DC
  Administered 2018-08-08 – 2018-08-10 (×7): 60 mg via INTRAVENOUS
  Filled 2018-08-08 (×7): qty 2

## 2018-08-08 MED ORDER — SODIUM CHLORIDE 0.9 % IV SOLN
500.0000 mg | Freq: Every day | INTRAVENOUS | Status: DC
  Administered 2018-08-08 – 2018-08-10 (×4): 500 mg via INTRAVENOUS
  Filled 2018-08-08 (×5): qty 500

## 2018-08-08 MED ORDER — VENLAFAXINE HCL ER 150 MG PO CP24
150.0000 mg | ORAL_CAPSULE | Freq: Every day | ORAL | Status: DC
  Administered 2018-08-08 – 2018-08-20 (×13): 150 mg via ORAL
  Filled 2018-08-08 (×13): qty 1

## 2018-08-08 MED ORDER — DICLOFENAC SODIUM 1 % TD GEL
2.0000 g | Freq: Four times a day (QID) | TRANSDERMAL | Status: DC
  Administered 2018-08-08 – 2018-08-16 (×12): 2 g via TOPICAL
  Filled 2018-08-08: qty 100

## 2018-08-08 MED ORDER — DEXTROMETHORPHAN POLISTIREX ER 30 MG/5ML PO SUER
30.0000 mg | Freq: Two times a day (BID) | ORAL | Status: DC | PRN
  Administered 2018-08-08 – 2018-08-09 (×2): 30 mg via ORAL
  Filled 2018-08-08 (×4): qty 5

## 2018-08-08 MED ORDER — RIVAROXABAN 20 MG PO TABS
20.0000 mg | ORAL_TABLET | Freq: Every day | ORAL | Status: DC
  Administered 2018-08-10 – 2018-08-19 (×10): 20 mg via ORAL
  Filled 2018-08-08 (×11): qty 1

## 2018-08-08 MED ORDER — LORATADINE 10 MG PO TABS
10.0000 mg | ORAL_TABLET | Freq: Every day | ORAL | Status: DC
  Administered 2018-08-08 – 2018-08-20 (×13): 10 mg via ORAL
  Filled 2018-08-08 (×13): qty 1

## 2018-08-08 NOTE — Progress Notes (Signed)
Inpatient Diabetes Program Recommendations  AACE/ADA: New Consensus Statement on Inpatient Glycemic Control (2015)  Target Ranges:  Prepandial:   less than 140 mg/dL      Peak postprandial:   less than 180 mg/dL (1-2 hours)      Critically ill patients:  140 - 180 mg/dL   Lab Results  Component Value Date   GLUCAP 83 11/22/2009   HGBA1C 5.9 (H) 09/08/2015    Review of Glycemic Control Results for LEGACY, CARRENDER (MRN [id number]) as of 08/08/2018 15:05  Ref. Range 08/08/2018 04:35  Glucose Latest Ref Range: 70 - 99 mg/dL 276 (H)   Inpatient Diabetes Program Recommendations:   While on steroids, -Glycemic control orders with Moderate correction tid  Thank you, Nani Gasser. Skyleen Bentley, RN, MSN, CDE  Diabetes Coordinator Inpatient Glycemic Control Team Team Pager [phone] (8am-5pm) 08/08/2018 3:07 PM

## 2018-08-08 NOTE — Progress Notes (Signed)
Assumed care from previous RN. Agree with earlier assessment. Pt brought to this RN's attention that she thinks "I have another hematoma forming". Pt has area on left hip that is firm and tender and has bruising in center. MD notified and Xarelto held and he will assess in [place], RN

## 2018-08-08 NOTE — ED Notes (Signed)
ED TO INPATIENT HANDOFF REPORT  Name/Age/Gender [name] White 63 y.o. female  Code Status Code Status History    Date Active Date Inactive Code Status Order ID Comments User Context   07/01/2018 1138 07/03/2018 1951 Full Code [id number]  Edwin Dada, MD ED   09/07/2015 2209 09/16/2015 1819 Full Code [id number]  Laverle Hobby, PA-C Inpatient   09/06/2015 2307 09/07/2015 2209 Full Code [id number]  [place] Lions, PA-C ED   05/27/2015 1359 05/28/2015 1419 Full Code [id number]  Evans Lance, MD Inpatient   03/22/2015 1734 03/23/2015 2127 Full Code [id number]  Janora Norlander, DO Inpatient   09/28/2014 0241 10/01/2014 0635 Full Code [id number]  Rosemarie Ax, MD Inpatient   03/05/2014 1333 03/06/2014 0422 Full Code [id number]  Starlyn Skeans, PA-C ED   01/31/2014 1103 02/04/2014 1831 Full Code [id number]  Hilton Sinclair, MD Inpatient   12/28/2013 0155 12/28/2013 1844 Full Code [id number]  Archie Patten, MD Inpatient   04/06/2013 2305 04/08/2013 0006 Full Code [id number]  Hilton Sinclair, MD Inpatient    Advance Directive Documentation     Most Recent Value  Type of Advance Directive  Living will  Pre-existing out of facility DNR order (yellow form or pink MOST form)  -  "MOST" Form in Place?  -      Home/SNF/Other Home  Chief Complaint Respitory Distress  Level of Care/Admitting Diagnosis ED Disposition    ED Disposition Condition [facility] [place]: [place] [[id number]]  Level of Care: Telemetry [5]  Admit to tele based on following criteria: Other see comments  Comments: hypoxia  Diagnosis: COPD with acute exacerbation [facility]) [[id number]]  Admitting Physician: Elwyn Reach [[id number]]  Attending Physician: Elwyn Reach [[id number]]  Estimated length of stay: past midnight tomorrow  Certification:: I certify this patient will need inpatient services for at least 2 midnights  PT Class (Do Not Modify): Inpatient [101]  PT Acc  Code (Do Not Modify): Private [1]       Medical History Past Medical History:  Diagnosis Date  . Anxiety   . Asthma   . Chronic lower back pain   . Depression   . Family history of anesthesia complication    "daughter; causes her to pass out afterwards"  . GERD (gastroesophageal reflux disease)   . Hypertension   . Hyperthyroidism   . Migraine    "monthly" (12/28/2013)  . Osteoarthritis    "both knees; back of my neck; right pelvic bone" (12/28/2013)  . Paroxysmal A-fib ([place])   . Pulmonary embolism ([place]) 12/28/2013   "2 clots in each lung"    Allergies Allergies  Allergen Reactions  . Caffeine Other (See Comments)    Migraine  . Vicodin [Hydrocodone-Acetaminophen] Nausea And Vomiting and Other (See Comments)    Headache   . Hydralazine Hcl     Other reaction(s): Other (See Comments) AKI leading to rhabdo and electrolyte abnormalities  . Hydrocodone-Acetaminophen Nausea And Vomiting  . Ciprofloxacin Hives and Rash  . Erythromycin Hives and Rash  . Lisinopril Cough    Other reaction(s): Cough  . Oxycodone Nausea And Vomiting    headache Other reaction(s): Vomiting  . Sulfamethoxazole-Trimethoprim Rash    IV Location/Drains/Wounds Patient Lines/Drains/Airways Status   Active Line/Drains/Airways    Name:   Placement date:   Placement time:   Site:   Days:   Peripheral IV 08/07/18 Left Hand   08/07/18    2126    Hand  1   Peripheral IV 08/07/18 Left Antecubital   08/07/18    2127    Antecubital   1          Labs/Imaging Results for orders placed or performed during the hospital encounter of 08/07/18 (from the past 48 hour(s))  CBC with Differential     Status: Abnormal   Collection Time: 08/07/18  9:30 PM  Result Value Ref Range   WBC 11.3 (H) 4.0 - 10.5 K/uL   RBC 4.12 3.87 - 5.11 MIL/uL   Hemoglobin 12.2 12.0 - 15.0 g/dL   HCT 39.6 36.0 - 46.0 %   MCV 96.1 80.0 - 100.0 fL   MCH 29.6 26.0 - 34.0 pg   MCHC 30.8 30.0 - 36.0 g/dL   RDW 15.9 (H) 11.5 - 15.5 %    Platelets 443 (H) 150 - 400 K/uL   nRBC 0.0 0.0 - 0.2 %   Neutrophils Relative % 74 %   Neutro Abs 8.4 (H) 1.7 - 7.7 K/uL   Lymphocytes Relative 18 %   Lymphs Abs 2.0 0.7 - 4.0 K/uL   Monocytes Relative 5 %   Monocytes Absolute 0.5 0.1 - 1.0 K/uL   Eosinophils Relative 2 %   Eosinophils Absolute 0.3 0.0 - 0.5 K/uL   Basophils Relative 0 %   Basophils Absolute 0.1 0.0 - 0.1 K/uL   Immature Granulocytes 1 %   Abs Immature Granulocytes 0.06 0.00 - 0.07 K/uL    Comment: Performed at [facility], [place] [address] [place], [place] [address]  Basic metabolic panel     Status: Abnormal   Collection Time: 08/07/18  9:30 PM  Result Value Ref Range   Sodium 141 135 - 145 mmol/L   Potassium 2.9 (L) 3.5 - 5.1 mmol/L   Chloride 107 98 - 111 mmol/L   CO2 24 22 - 32 mmol/L   Glucose, Bld 140 (H) 70 - 99 mg/dL   BUN 11 8 - 23 mg/dL   Creatinine, Ser 0.58 0.44 - 1.00 mg/dL   Calcium 8.5 (L) 8.9 - 10.3 mg/dL   GFR calc non Af Amer >60 >60 mL/min   GFR calc Af Amer >60 >60 mL/min   Anion gap 10 5 - 15    Comment: Performed at [facility], [place] [address] [place], [place] [address]  I-stat troponin, ED     Status: None   Collection Time: 08/07/18 10:16 PM  Result Value Ref Range   Troponin i, poc 0.01 0.00 - 0.08 ng/mL   Comment 3            Comment: Due to the release kinetics of cTnI, a negative result within the first hours of the onset of symptoms does not rule out myocardial infarction with certainty. If myocardial infarction is still suspected, repeat the test at appropriate intervals.    Dg Chest Port 1 View  Result Date: 08/07/2018 CLINICAL DATA:  Respiratory distress for 1 week EXAM: PORTABLE CHEST 1 VIEW COMPARISON:  07/01/2018 FINDINGS: Cardiac shadow is stable. Aortic calcifications are again seen. Lungs are well aerated bilaterally. No focal infiltrate or sizable effusion is seen. Previously noted infiltrates have resolved. No bony  abnormality is seen. IMPRESSION: Clearing of previously seen basilar infiltrates. Electronically Signed   By: Inez Catalina M.D.   On: 08/07/2018 22:00   EKG Interpretation  Date/Time:  Tuesday August 07 2018 22:29:36 EST Ventricular Rate:  86 PR Interval:    QRS Duration: 90 QT Interval:  378 QTC Calculation: 453 R Axis:   -11 Text Interpretation:  Sinus rhythm Atrial premature complexes in couplets Since last tracing rate slower Otherwise no significant change Confirmed by Deno Etienne [phone]) on 08/07/2018 10:34:11 PM   Pending Labs Unresulted Labs (From admission, onward)    Start     Ordered   Signed and Held  Comprehensive metabolic panel  Tomorrow morning,   R     Signed and Held   Signed and Held  CBC  Tomorrow morning,   R     Signed and Held          Vitals/Pain Today's Vitals   08/07/18 2155 08/07/18 2227 08/08/18 0124 08/08/18 0130  BP: (!) 135/52  126/82 (!) 143/90  Pulse: 97  (!) 117 (!) 117  Resp: (!) 24     Temp: 98.7 F (37.1 C)     TempSrc: Oral     SpO2: 99%  100% 100%  PainSc: 4  4       Isolation Precautions No active isolations  Medications Medications  albuterol (PROVENTIL,VENTOLIN) solution continuous neb (10 mg/hr Nebulization New Bag/Given 08/07/18 2252)  ipratropium-albuterol (DUONEB) 0.5-2.5 (3) MG/3ML nebulizer solution 3 mL (3 mLs Nebulization Given 08/07/18 2140)    Mobility walks

## 2018-08-08 NOTE — Progress Notes (Signed)
..    Vital Signs MEWS/VS Documentation      08/07/2018 2252 08/08/2018 0124 08/08/2018 0130 08/08/2018 0213   MEWS Score:  -  3  3  4    MEWS Score Color:  -  Yellow  Yellow  Red   Resp:  -  -  -  (!) 28   Pulse:  -  (!) 117  (!) 117  (!) 121   BP:  -  126/82  (!) 143/90  130/85   Temp:  -  -  -  98.6 F (37 C)   O2 Device:  Nasal Cannula  -  -  Nasal Cannula   O2 Flow Rate (L/min):  4 L/min  -  -  2 L/min       Pt just arrived to floor and transferred from stretcher to bed. Pt having some dyspnea with exertion but not in any distress. Pt currently sitting on edger of bed, talking and eating some snacks.  Will continue to monitor.     [name] White 08/08/2018,2:29 AM

## 2018-08-08 NOTE — Progress Notes (Signed)
PROGRESS NOTE  SOLE LENGACHER OZD:[id number] DOB: 1956/01/09 DOA: 08/07/2018 PCP: [name], MD   LOS: 1 day   Brief narrative:  [name] White is a 63 y.o. female with medical history significant of COPD, depression, GERD, hypertension, history of pulmonary embolism, atrial fibrillation and hypothyroidism who has been having progressive shortness of breath and cough for the last 1 week.  Patient has been taking her home regimen of treatment.  She also has taken antibiotics within the last week but no relief.  She normally follows up with pulmonary.  She was seen in the ER evaluated and was found to have marked expiratory wheezing.  She was given CPAP which seemed to help.  Despite 10 mg of albuterol 0.5 mg of Atrovent 125 mg Solu-Medrol and 2 g of magnesium she continues to wheeze and remains hypoxic with respiratory distress so she is being admitted to the hospital for COPD exacerbation.  ED Course: Temperature is 98.7, blood pressure 135/52, pulse 97, respirate 24 and oxygen sat 99% room air.  White count is 11.3 hemoglobin 12.2 and platelets 443.  Sodium 141 potassium is 2.9 chloride 107 CO2 24 BUN 11 creatinine 0.58 glucose 140.  Chest x-ray showed clearing of the previously seen bibasilar infiltrates.  Patient treated with 2 g magnesium, 125 mg of Solu-Medrol, 10 mg of albuterol and 0.5 L of Atrovent.  She was then admitted to the hospital for treatment.  Assessment/Plan:  Principal Problem:   COPD with acute exacerbation (HCC) Active Problems:   Morbid obesity (HCC)   HYPERTENSION, BENIGN SYSTEMIC   GASTROESOPHAGEAL REFLUX, NO ESOPHAGITIS   Atrial flutter (HCC)   Hypokalemia   Chronic respiratory failure with hypoxia (HCC)   Leucocytosis   Acute on chronic respiratory failure secondary to acute COPD exacerbation:  Patient is a [place] of oxygen at baseline.  Continue oxygen nebulizer IV steroids.  Patient is still symptomatic and will require further treatment in the  hospital to optimize her outcome and reduce the chance of recurrent admissions.  Hypertension: Blood pressure is relatively stable.  Continue on Cardizem.  History of exertional A. fib and a flutter:  Continue Cardizem, flecainide.  Continue Xarelto for anticoagulation.  Hypokalemia: Improved after replacement.  Leukocytosis: Probably residual from recent pneumonia.  Continue IV antibiotics with Rocephin and Zithromax.  GERD: Continue PPIs   VTE Prophylaxis: Xarelto  Code Status: Full code  Family Communication: No family members at bedside  Disposition Plan: Home likely tomorrow if she continues to improve.  We will ambulate the patient.   Consultants:  None  Procedures:  None  Antibiotics: Anti-infectives (From admission, onward)   Start     Dose/Rate Route Frequency Ordered Stop   08/08/18 0215  cefTRIAXone (ROCEPHIN) 1 g in sodium chloride 0.9 % 100 mL IVPB     1 g 200 mL/hr over 30 Minutes Intravenous Daily at bedtime 08/08/18 0206     08/08/18 0215  azithromycin (ZITHROMAX) 500 mg in sodium chloride 0.9 % 250 mL IVPB     500 mg 250 mL/hr over 60 Minutes Intravenous Daily at bedtime 08/08/18 0206         Subjective: Patient history complains of shortness of breath cough and wheezing but has felt slightly better than yesterday but still feels very symptomatic and concerned.  Objective: Vitals:   08/08/18 0717 08/08/18 1111  BP: 124/87 113/80  Pulse: 90 88  Resp: 20 20  Temp: 97.8 F (36.6 C) 98.1 F (36.7 C)  SpO2: 94% 97%  Intake/Output Summary (Last 24 hours) at 08/08/2018 1421 Last data filed at 08/08/2018 0700 Gross per 24 hour  Intake 977.48 ml  Output -  Net 977.48 ml   Filed Weights   08/08/18 0224  Weight: (!) 137.7 kg   Body mass index is 50.52 kg/m.   Physical Exam: GENERAL: Patient is alert awake and oriented. Not in obvious distress.  On nasal cannula HENT: No scleral pallor or icterus. Pupils equally reactive to light.  Oral mucosa is moist NECK: is supple, no palpable thyroid enlargement. CHEST: Diminished breath sounds bilaterally, wheezes noted  CVS: S1 and S2 heard, no murmur. Regular rate and rhythm. No pericardial rub. ABDOMEN: Soft, non-tender, bowel sounds are present. No palpable hepato-splenomegaly. EXTREMITIES: No edema. CNS: Cranial nerves are intact. No focal motor or sensory deficits. SKIN: warm and dry without rashes.  Data Review: I have personally reviewed the following laboratory data and studies,  CBC: Recent Labs  Lab 08/07/18 2130 08/08/18 0435  WBC 11.3* 10.6*  NEUTROABS 8.4*  --   HGB 12.2 11.5*  HCT 39.6 38.5  MCV 96.1 98.5  PLT 443* 449*   Basic Metabolic Panel: Recent Labs  Lab 08/07/18 2130 08/08/18 0435  NA 141 140  K 2.9* 3.6  CL 107 105  CO2 24 19*  GLUCOSE 140* 276*  BUN 11 12  CREATININE 0.58 0.70  CALCIUM 8.5* 8.4*   Liver Function Tests: Recent Labs  Lab 08/08/18 0435  AST 29  ALT 16  ALKPHOS 78  BILITOT 0.4  PROT 7.3  ALBUMIN 3.3*   No results for input(s): LIPASE, AMYLASE in the last 168 hours. No results for input(s): AMMONIA in the last 168 hours. Cardiac Enzymes: No results for input(s): CKTOTAL, CKMB, CKMBINDEX, TROPONINI in the last 168 hours. BNP (last 3 results) Recent Labs    07/01/18 0836  BNP 22.4    ProBNP (last 3 results) No results for input(s): PROBNP in the last 8760 hours.  CBG: No results for input(s): GLUCAP in the last 168 hours. No results found for this or any previous visit (from the past 240 hour(s)).   Studies: Dg Chest Port 1 View  Result Date: 08/07/2018 CLINICAL DATA:  Respiratory distress for 1 week EXAM: PORTABLE CHEST 1 VIEW COMPARISON:  07/01/2018 FINDINGS: Cardiac shadow is stable. Aortic calcifications are again seen. Lungs are well aerated bilaterally. No focal infiltrate or sizable effusion is seen. Previously noted infiltrates have resolved. No bony abnormality is seen. IMPRESSION: Clearing  of previously seen basilar infiltrates. Electronically Signed   By: Inez Catalina M.D.   On: 08/07/2018 22:00    Scheduled Meds: . cycloSPORINE  1 drop Both Eyes BID  . diclofenac sodium  2 g Topical QID  . diltiazem  120 mg Oral Daily  . flecainide  100 mg Oral BID  . fluticasone  1 spray Each Nare Daily  . ketotifen  1 drop Both Eyes BID  . loratadine  10 mg Oral Daily  . methylPREDNISolone (SOLU-MEDROL) injection  125 mg Intravenous Q6H  . rivaroxaban  20 mg Oral QPC supper  . venlafaxine XR  150 mg Oral Q breakfast    Continuous Infusions: . sodium chloride Stopped (08/08/18 0654)  . azithromycin 250 mL/hr at 08/08/18 0700  . cefTRIAXone (ROCEPHIN)  IV Stopped (08/08/18 0330)     Flora Lipps, MD  [facility] 08/08/2018

## 2018-08-09 ENCOUNTER — Encounter (HOSPITAL_BASED_OUTPATIENT_CLINIC_OR_DEPARTMENT_OTHER): Payer: Medicare HMO

## 2018-08-09 LAB — CBC
HCT: 36.6 % (ref 36.0–46.0)
Hemoglobin: 11.1 g/dL — ABNORMAL LOW (ref 12.0–15.0)
MCH: 29.8 pg (ref 26.0–34.0)
MCHC: 30.3 g/dL (ref 30.0–36.0)
MCV: 98.4 fL (ref 80.0–100.0)
PLATELETS: 462 10*3/uL — AB (ref 150–400)
RBC: 3.72 MIL/uL — ABNORMAL LOW (ref 3.87–5.11)
RDW: 16.2 % — ABNORMAL HIGH (ref 11.5–15.5)
WBC: 21.4 10*3/uL — ABNORMAL HIGH (ref 4.0–10.5)
nRBC: 0 % (ref 0.0–0.2)

## 2018-08-09 LAB — BASIC METABOLIC PANEL
Anion gap: 10 (ref 5–15)
BUN: 16 mg/dL (ref 8–23)
CO2: 23 mmol/L (ref 22–32)
Calcium: 8.6 mg/dL — ABNORMAL LOW (ref 8.9–10.3)
Chloride: 107 mmol/L (ref 98–111)
Creatinine, Ser: 0.58 mg/dL (ref 0.44–1.00)
GFR calc Af Amer: >60 mL/min (ref >60)
GFR calc non Af Amer: >60 mL/min (ref >60)
Glucose, Bld: 145 mg/dL — ABNORMAL HIGH (ref 70–99)
Potassium: 3.8 mmol/L (ref 3.5–5.1)
Sodium: 140 mmol/L (ref 135–145)

## 2018-08-09 MED ORDER — BUDESONIDE 0.25 MG/2ML IN SUSP
0.2500 mg | Freq: Two times a day (BID) | RESPIRATORY_TRACT | Status: DC
  Administered 2018-08-09 – 2018-08-20 (×23): 0.25 mg via RESPIRATORY_TRACT
  Filled 2018-08-09 (×23): qty 2

## 2018-08-09 MED ORDER — PANTOPRAZOLE SODIUM 40 MG PO TBEC
40.0000 mg | DELAYED_RELEASE_TABLET | Freq: Two times a day (BID) | ORAL | Status: DC
  Administered 2018-08-09 – 2018-08-20 (×23): 40 mg via ORAL
  Filled 2018-08-09 (×22): qty 1

## 2018-08-09 MED ORDER — ALBUTEROL SULFATE (2.5 MG/3ML) 0.083% IN NEBU
2.5000 mg | INHALATION_SOLUTION | Freq: Four times a day (QID) | RESPIRATORY_TRACT | Status: DC
  Administered 2018-08-09 – 2018-08-11 (×10): 2.5 mg via RESPIRATORY_TRACT
  Filled 2018-08-09 (×10): qty 3

## 2018-08-09 MED ORDER — ALBUTEROL SULFATE (2.5 MG/3ML) 0.083% IN NEBU
2.5000 mg | INHALATION_SOLUTION | RESPIRATORY_TRACT | Status: DC | PRN
  Administered 2018-08-09 – 2018-08-14 (×2): 2.5 mg via RESPIRATORY_TRACT
  Filled 2018-08-09: qty 3

## 2018-08-09 MED ORDER — GUAIFENESIN-CODEINE 100-10 MG/5ML PO SOLN
10.0000 mL | ORAL | Status: DC | PRN
  Administered 2018-08-09 – 2018-08-18 (×17): 10 mL via ORAL
  Filled 2018-08-09 (×18): qty 10

## 2018-08-09 NOTE — Progress Notes (Signed)
PROGRESS NOTE  [name] TIW:[id number] DOB: 1956-02-19 DOA: 08/07/2018 PCP: [name], MD   LOS: 2 days   Brief narrative:  [name] White is a 63 y.o. female with medical history significant of COPD, depression, GERD, hypertension, history of pulmonary embolism, atrial fibrillation and hypothyroidism who has been having progressive shortness of breath and cough for the last 1 week.  Patient has been taking her home regimen of treatment.  She also has taken antibiotics within the last week but no relief.  She normally follows up with pulmonary.  She was seen in the ER evaluated and was found to have marked expiratory wheezing.  She was given CPAP which seemed to help.  Despite 10 mg of albuterol 0.5 mg of Atrovent 125 mg Solu-Medrol and 2 g of magnesium she continued to wheeze and remained hypoxic with respiratory distress so she was admitted to the hospital for COPD exacerbation.   Assessment/Plan:  Principal Problem:   COPD with acute exacerbation (HCC) Active Problems:   Morbid obesity (HCC)   HYPERTENSION, BENIGN SYSTEMIC   GASTROESOPHAGEAL REFLUX, NO ESOPHAGITIS   Atrial flutter (HCC)   Hypokalemia   Chronic respiratory failure with hypoxia (HCC)   Leucocytosis   Acute on chronic respiratory failure secondary to acute COPD exacerbation:  Patient is at 2 litres of oxygen at baseline.  Continue oxygen, nebulizer IV steroids.  As of IV steroids were decreased yesterday but patient is still wheezing and symptomatic.  Will add budesonide belies her today and increase the frequency of albuterol inhalation.  She does have history of sinus drainage and reflux.  We will add Protonix.  Patient is already on Flonase will continue with that.   Hypertension:  on Cardizem.  History of exertional A. fib and a flutter:  Continue Cardizem, flecainide.  Continue Xarelto for anticoagulation.  Concern for left hip bruise.  Xarelto on hold yesterday evening.  Will hold Xarelto today.   Hemoglobin has remained stable.  consider restarting Xarelto in a.m.  Hypokalemia: Improved after replacement.  Check BMP in a.m.  Leukocytosis: Probably residual from recent pneumonia use of IV steroids..  Continue IV antibiotics with Rocephin and Zithromax.  Monitor CBC in a.m.  GERD: Add Protonix   VTE Prophylaxis: Xarelto will be initiated in am  Code Status: Full code  Family Communication: Spoke with the patient's family member at bedside  Disposition Plan: Likely home tomorrow due to persistent wheezing cough.  Consultants:  None  Procedures:  None  Antibiotics: Anti-infectives (From admission, onward)   Start     Dose/Rate Route Frequency Ordered Stop   08/08/18 0215  cefTRIAXone (ROCEPHIN) 1 g in sodium chloride 0.9 % 100 mL IVPB     1 g 200 mL/hr over 30 Minutes Intravenous Daily at bedtime 08/08/18 0206     08/08/18 0215  azithromycin (ZITHROMAX) 500 mg in sodium chloride 0.9 % 250 mL IVPB     500 mg 250 mL/hr over 60 Minutes Intravenous Daily at bedtime 08/08/18 0206       Subjective: Patient still complains of the shortness of breath and increased wheezing overnight with increased cough with clear phlegm production.  Denies any chest pain.  Wishes for more breathing treatment.  She does have history of sinus problems and reflux.  Objective: Vitals:   08/09/18 0900 08/09/18 1040  BP: 138/88   Pulse: 86   Resp:    Temp: 98.4 F (36.9 C)   SpO2: 96% 98%    Intake/Output Summary (Last 24  hours) at 08/09/2018 1119 Last data filed at 08/09/2018 0523 Gross per 24 hour  Intake 2689.66 ml  Output -  Net 2689.66 ml   Filed Weights   08/08/18 0224  Weight: (!) 137.7 kg   Body mass index is 50.52 kg/m.   Physical Exam: General:  Average built, not in obvious distress.  Morbidly obese.  On nasal cannula oxygen. HENT: Normocephalic, pupils equally reacting to light and accommodation.  No scleral pallor or icterus noted. Oral mucosa is moist.  Chest:  Respirations bilaterally with diffuse wheezing noted. CVS: S1 &S2 heard. No murmur.  Regular rate and rhythm. Abdomen: Soft, nontender, nondistended.  Bowel sounds are heard.  Liver is not palpable, no abdominal mass palpated Extremities: No cyanosis, clubbing or edema.  Peripheral pulses are palpable. Psych: Alert, awake and oriented, normal mood CNS:  No cranial nerve deficits.  Power equal in all extremities.  No sensory deficits noted.  No cerebellar signs.   Skin: Warm and dry.  No rashes noted.   Data Review: I have personally reviewed the following laboratory data and studies,  CBC: Recent Labs  Lab 08/07/18 2130 08/08/18 0435 08/09/18 0423  WBC 11.3* 10.6* 21.4*  NEUTROABS 8.4*  --   --   HGB 12.2 11.5* 11.1*  HCT 39.6 38.5 36.6  MCV 96.1 98.5 98.4  PLT 443* 417* 573*   Basic Metabolic Panel: Recent Labs  Lab 08/07/18 2130 08/08/18 0435 08/09/18 0423  NA 141 140 140  K 2.9* 3.6 3.8  CL 107 105 107  CO2 24 19* 23  GLUCOSE 140* 276* 145*  BUN 11 12 16   CREATININE 0.58 0.70 0.58  CALCIUM 8.5* 8.4* 8.6*   Liver Function Tests: Recent Labs  Lab 08/08/18 0435  AST 29  ALT 16  ALKPHOS 78  BILITOT 0.4  PROT 7.3  ALBUMIN 3.3*   No results for input(s): LIPASE, AMYLASE in the last 168 hours. No results for input(s): AMMONIA in the last 168 hours. Cardiac Enzymes: No results for input(s): CKTOTAL, CKMB, CKMBINDEX, TROPONINI in the last 168 hours. BNP (last 3 results) Recent Labs    07/01/18 0836  BNP 22.4    ProBNP (last 3 results) No results for input(s): PROBNP in the last 8760 hours.  CBG: No results for input(s): GLUCAP in the last 168 hours. No results found for this or any previous visit (from the past 240 hour(s)).   Studies: Dg Chest Port 1 View  Result Date: 08/07/2018 CLINICAL DATA:  Respiratory distress for 1 week EXAM: PORTABLE CHEST 1 VIEW COMPARISON:  07/01/2018 FINDINGS: Cardiac shadow is stable. Aortic calcifications are again seen.  Lungs are well aerated bilaterally. No focal infiltrate or sizable effusion is seen. Previously noted infiltrates have resolved. No bony abnormality is seen. IMPRESSION: Clearing of previously seen basilar infiltrates. Electronically Signed   By: Inez Catalina M.D.   On: 08/07/2018 22:00    Scheduled Meds: . albuterol  2.5 mg Nebulization Q6H  . budesonide (PULMICORT) nebulizer solution  0.25 mg Nebulization BID  . cycloSPORINE  1 drop Both Eyes BID  . diclofenac sodium  2 g Topical QID  . diltiazem  120 mg Oral Daily  . flecainide  100 mg Oral BID  . fluticasone  1 spray Each Nare Daily  . ketotifen  1 drop Both Eyes BID  . loratadine  10 mg Oral Daily  . methylPREDNISolone (SOLU-MEDROL) injection  60 mg Intravenous Q6H  . rivaroxaban  20 mg Oral QPC supper  . venlafaxine  XR  150 mg Oral Q breakfast    Continuous Infusions: . sodium chloride 75 mL/hr at 08/09/18 0641  . azithromycin 500 mg (08/08/18 5027)  . cefTRIAXone (ROCEPHIN)  IV 1 g (08/08/18 2317)     Flora Lipps, MD  [facility] 08/09/2018

## 2018-08-09 NOTE — Progress Notes (Signed)
Assumed care of patient from previous nurse. Agree with previous nurses assessment. Will continue to monitor.  

## 2018-08-10 DIAGNOSIS — E876 Hypokalemia: Secondary | ICD-10-CM

## 2018-08-10 LAB — BASIC METABOLIC PANEL
Anion gap: 8 (ref 5–15)
BUN: 19 mg/dL (ref 8–23)
CO2: 24 mmol/L (ref 22–32)
Calcium: 8.5 mg/dL — ABNORMAL LOW (ref 8.9–10.3)
Chloride: 106 mmol/L (ref 98–111)
Creatinine, Ser: 0.66 mg/dL (ref 0.44–1.00)
GFR calc Af Amer: >60 mL/min (ref >60)
GFR calc non Af Amer: >60 mL/min (ref >60)
Glucose, Bld: 142 mg/dL — ABNORMAL HIGH (ref 70–99)
Potassium: 4.1 mmol/L (ref 3.5–5.1)
Sodium: 138 mmol/L (ref 135–145)

## 2018-08-10 LAB — CBC
HCT: 36.1 % (ref 36.0–46.0)
Hemoglobin: 10.9 g/dL — ABNORMAL LOW (ref 12.0–15.0)
MCH: 30.2 pg (ref 26.0–34.0)
MCHC: 30.2 g/dL (ref 30.0–36.0)
MCV: 100 fL (ref 80.0–100.0)
PLATELETS: 414 10*3/uL — AB (ref 150–400)
RBC: 3.61 MIL/uL — ABNORMAL LOW (ref 3.87–5.11)
RDW: 16.3 % — ABNORMAL HIGH (ref 11.5–15.5)
WBC: 19 10*3/uL — ABNORMAL HIGH (ref 4.0–10.5)
nRBC: 0 % (ref 0.0–0.2)

## 2018-08-10 MED ORDER — METHYLPREDNISOLONE SODIUM SUCC 40 MG IJ SOLR
40.0000 mg | Freq: Three times a day (TID) | INTRAMUSCULAR | Status: DC
  Administered 2018-08-10 – 2018-08-11 (×3): 40 mg via INTRAVENOUS
  Filled 2018-08-10 (×3): qty 1

## 2018-08-10 NOTE — Evaluation (Signed)
Physical Therapy Evaluation Patient Details Name: [name] White MRN: [id number] DOB: [birth year] Today's Date: 08/10/2018   History of Present Illness  63 y.o. female with medical history significant of COPD, depression, GERD, hypertension, pulmonary embolism, atrial fibrillation and hypothyroidism and admitted for Acute on chronic respiratory failure secondary to acute COPD exacerbation  Clinical Impression  Pt admitted with above diagnosis. Pt currently with functional limitations due to the deficits listed below (see PT Problem List).  Pt will benefit from skilled PT to increase their independence and safety with mobility to allow discharge to the venue listed below.  Pt assisted with ambulating short distance.  Pt requiring min assist and fatigues quickly.  Pt reports a fall prior to admission.  Pt currently living with her sister and has a flight of steps to her bedroom.  Pt agreeable to SNF upon d/c.     Follow Up Recommendations SNF    Equipment Recommendations  None recommended by PT    Recommendations for Other Services       Precautions / Restrictions Precautions Precautions: Fall Precaution Comments: chronic 2L O2      Mobility  Bed Mobility Overal bed mobility: Needs Assistance Bed Mobility: Supine to Sit;Sit to Supine     Supine to sit: Supervision;HOB elevated Sit to supine: Supervision;HOB elevated      Transfers Overall transfer level: Needs assistance Equipment used: Straight cane Transfers: Sit to/from Stand Sit to Stand: Min guard         General transfer comment: verbal cues for safe technique  Ambulation/Gait Ambulation/Gait assistance: Min assist Gait Distance (Feet): 45 Feet(x2) Assistive device: Straight cane Gait Pattern/deviations: Step-to pattern;Antalgic;Decreased stance time - left     General Gait Details: verbal cues for sequencing, distance to tolerance, pt fatigued quickly, remained on 2L O2 [place] and Spo2 93%  Stairs             Wheelchair Mobility    Modified Rankin (Stroke Patients Only)       Balance Overall balance assessment: History of Falls(fall just prior to admission)                                           Pertinent Vitals/Pain Pain Assessment: 0-10 Pain Score: 4  Pain Location: L posterior back and buttock (pt reports hematoma and fall prior to admission) Pain Descriptors / Indicators: Sore Pain Intervention(s): Monitored during session;Repositioned    Home Living Family/patient expects to be discharged to:: Private residence Living Arrangements: Other relatives(sister and family)   Type of Home: House       Home Layout: Two level;Bed/bath upstairs Home Equipment: Cane - single point      Prior Function Level of Independence: Independent with assistive device(s)               Hand Dominance        Extremity/Trunk Assessment        Lower Extremity Assessment Lower Extremity Assessment: Generalized weakness       Communication   Communication: No difficulties  Cognition Arousal/Alertness: Awake/alert Behavior During Therapy: WFL for tasks assessed/performed Overall Cognitive Status: Within Functional Limits for tasks assessed                                        General Comments  Exercises     Assessment/Plan    PT Assessment Patient needs continued PT services  PT Problem List Decreased strength;Decreased mobility;Decreased activity tolerance;Decreased balance;Decreased knowledge of use of DME;Cardiopulmonary status limiting activity       PT Treatment Interventions DME instruction;Functional mobility training;Patient/family education;Balance training;Gait training;Therapeutic activities;Neuromuscular re-education;Therapeutic exercise;Stair training    PT Goals (Current goals can be found in the Care Plan section)  Acute Rehab PT Goals PT Goal Formulation: With patient Time For Goal Achievement:  08/24/18 Potential to Achieve Goals: Good    Frequency Min 2X/week   Barriers to discharge        Co-evaluation               AM-PAC PT "6 Clicks" Mobility  Outcome Measure Help needed turning from your back to your side while in a flat bed without using bedrails?: A Little Help needed moving from lying on your back to sitting on the side of a flat bed without using bedrails?: A Little Help needed moving to and from a bed to a chair (including a wheelchair)?: A Little Help needed standing up from a chair using your arms (e.g., wheelchair or bedside chair)?: A Little Help needed to walk in hospital room?: A Little Help needed climbing 3-5 steps with a railing? : A Lot 6 Click Score: 17    End of Session Equipment Utilized During Treatment: Gait belt;Oxygen Activity Tolerance: Patient limited by fatigue Patient left: in bed;with call bell/phone within reach Nurse Communication: Mobility status PT Visit Diagnosis: Difficulty in walking, not elsewhere classified (R26.2)    Time: 8453-6468 PT Time Calculation (min) (ACUTE ONLY): 13 min   Charges:   PT Evaluation $PT Eval Low Complexity: [place] [facility], PT, DPT Acute Rehabilitation Services Office: [phone] Pager: [phone]  Trena Platt 08/10/2018, 1:04 PM

## 2018-08-10 NOTE — Care Management Important Message (Signed)
Important Message  Patient Details  Name: [name] White MRN: [id number] Date of Birth: 1955-11-20   Medicare Important Message Given:  Yes    Kerin Salen 08/10/2018, 12:49 [place] Message  Patient Details  Name: [name] White MRN: [id number] Date of Birth: July 29, 1955   Medicare Important Message Given:  Yes    Kerin Salen 08/10/2018, 12:49 PM

## 2018-08-10 NOTE — Progress Notes (Signed)
PROGRESS NOTE  [name] White IZT:[id number] DOB: 03/09/1956 DOA: 08/07/2018 PCP: [name], MD   LOS: 3 days   Brief narrative:  [name] White is a 63 y.o. female with medical history significant of COPD, depression, GERD, hypertension, history of pulmonary embolism, atrial fibrillation and hypothyroidism presented to the hospital with shortness of breath and cough for 1 week, failed outpatient treatment with antibiotics.  Patient was seen in the ER evaluated and was found to have marked expiratory wheezing.  She was given CPAP which seemed to help.  Despite 10 mg of albuterol 0.5 mg of Atrovent 125 mg Solu-Medrol and 2 g of magnesium she continued to wheeze and remained hypoxic with respiratory distress so she was being admitted to the hospital for COPD exacerbation.  Chest x-ray showed clearing of the previously seen bibasilar infiltrates.  Assessment/Plan:  Principal Problem:   COPD with acute exacerbation (HCC) Active Problems:   Morbid obesity (HCC)   HYPERTENSION, BENIGN SYSTEMIC   GASTROESOPHAGEAL REFLUX, NO ESOPHAGITIS   Atrial flutter (HCC)   Hypokalemia   Chronic respiratory failure with hypoxia (HCC)   Leucocytosis   Acute on chronic respiratory failure secondary to acute COPD exacerbation: Patient is at 2 litres of oxygen at baseline.  Continue oxygen, nebulizer IV steroids.  Continue to decrease IV steroids, added budesonide nebulizer yesterday.   She does have history of sinus drainage and reflux.  Continue Protonix.  Patient is already on Flonase will continue with that.  Patient still continues to have wheezing and is starting to improve.  Continue current level of treatment.  Start p.o. prednisone on discharge likely tomorrow.  Hypertension:  on Cardizem.  Relatively controlled.  History of exertional A. fib and a flutter: Continued on Cardizem, flecainide.  Continue Xarelto for anticoagulation.  Concern for left hip bruise.  Xarelto on hold x 2days.   Restart today  Hypokalemia: Resolved.  Leukocytosis:Probably residual from recent pneumonia use of IV steroids.  Will monitor  GERD: Add Protonix   VTE Prophylaxis: Xarelto  Code Status: Full code  Family Communication: No family members were available at bedside.  Disposition Plan: Likely home tomorrow.  Continue to decrease his steroids.  Ambulate the patient today.  Will get physical therapy evaluation.  Patient might need rehabilitation.  Consultants:  None  Procedures:  None  Antibiotics: Anti-infectives (From admission, onward)   Start     Dose/Rate Route Frequency Ordered Stop   08/08/18 0215  cefTRIAXone (ROCEPHIN) 1 g in sodium chloride 0.9 % 100 mL IVPB     1 g 200 mL/hr over 30 Minutes Intravenous Daily at bedtime 08/08/18 0206     08/08/18 0215  azithromycin (ZITHROMAX) 500 mg in sodium chloride 0.9 % 250 mL IVPB     500 mg 250 mL/hr over 60 Minutes Intravenous Daily at bedtime 08/08/18 0206        Subjective: Patient feels little better with breathing but still has some wheezing and cough.  She feels weak.  She wants to ambulate.  Objective: Vitals:   08/10/18 0751 08/10/18 1034  BP:  115/69  Pulse:  97  Resp:  17  Temp:  98.3 F (36.8 C)  SpO2: 98% 95%    Intake/Output Summary (Last 24 hours) at 08/10/2018 1227 Last data filed at 08/10/2018 0800 Gross per 24 hour  Intake 846.99 ml  Output -  Net 846.99 ml   Filed Weights   08/08/18 0224  Weight: (!) 137.7 kg   Body mass index is 50.52 kg/m.  Physical Exam: General:  Average built, not in obvious distress.  On nasal cannula.  Morbidly obese HENT: Normocephalic, pupils equally reacting to light and accommodation.  No scleral pallor or icterus noted. Oral mucosa is moist.  Chest: Diminished breath sounds bilaterally.  Mild wheezes noted. CVS: S1 &S2 heard. No murmur.  Regular rate and rhythm. Abdomen: Soft, nontender, nondistended.  Bowel sounds are heard.  Liver is not palpable, no  abdominal mass palpated Extremities: No cyanosis, clubbing or edema.  Peripheral pulses are palpable. Psych: Alert, awake and oriented, normal mood CNS:  No cranial nerve deficits.  Power equal in all extremities.  No sensory deficits noted.  No cerebellar signs.   Skin: Warm and dry.  No rashes noted.  Data Review: I have personally reviewed the following laboratory data and studies,  CBC: Recent Labs  Lab 08/07/18 2130 08/08/18 0435 08/09/18 0423 08/10/18 0415  WBC 11.3* 10.6* 21.4* 19.0*  NEUTROABS 8.4*  --   --   --   HGB 12.2 11.5* 11.1* 10.9*  HCT 39.6 38.5 36.6 36.1  MCV 96.1 98.5 98.4 100.0  PLT 443* 417* 462* 989*   Basic Metabolic Panel: Recent Labs  Lab 08/07/18 2130 08/08/18 0435 08/09/18 0423 08/10/18 0415  NA 141 140 140 138  K 2.9* 3.6 3.8 4.1  CL 107 105 107 106  CO2 24 19* 23 24  GLUCOSE 140* 276* 145* 142*  BUN 11 12 16 19   CREATININE 0.58 0.70 0.58 0.66  CALCIUM 8.5* 8.4* 8.6* 8.5*   Liver Function Tests: Recent Labs  Lab 08/08/18 0435  AST 29  ALT 16  ALKPHOS 78  BILITOT 0.4  PROT 7.3  ALBUMIN 3.3*   No results for input(s): LIPASE, AMYLASE in the last 168 hours. No results for input(s): AMMONIA in the last 168 hours. Cardiac Enzymes: No results for input(s): CKTOTAL, CKMB, CKMBINDEX, TROPONINI in the last 168 hours. BNP (last 3 results) Recent Labs    07/01/18 0836  BNP 22.4    ProBNP (last 3 results) No results for input(s): PROBNP in the last 8760 hours.  CBG: No results for input(s): GLUCAP in the last 168 hours. No results found for this or any previous visit (from the past 240 hour(s)).   Studies: No results found.  Scheduled Meds: . albuterol  2.5 mg Nebulization Q6H  . budesonide (PULMICORT) nebulizer solution  0.25 mg Nebulization BID  . cycloSPORINE  1 drop Both Eyes BID  . diclofenac sodium  2 g Topical QID  . diltiazem  120 mg Oral Daily  . flecainide  100 mg Oral BID  . fluticasone  1 spray Each Nare Daily    . ketotifen  1 drop Both Eyes BID  . loratadine  10 mg Oral Daily  . methylPREDNISolone (SOLU-MEDROL) injection  40 mg Intravenous Q8H  . pantoprazole  40 mg Oral BID  . rivaroxaban  20 mg Oral QPC supper  . venlafaxine XR  150 mg Oral Q breakfast    Continuous Infusions: . azithromycin Stopped (08/10/18 0659)  . cefTRIAXone (ROCEPHIN)  IV Stopped (08/10/18 2119)     Flora Lipps, MD  [facility] 08/10/2018

## 2018-08-11 MED ORDER — METHYLPREDNISOLONE SODIUM SUCC 40 MG IJ SOLR
40.0000 mg | Freq: Two times a day (BID) | INTRAMUSCULAR | Status: DC
  Administered 2018-08-11 – 2018-08-12 (×2): 40 mg via INTRAVENOUS
  Filled 2018-08-11 (×2): qty 1

## 2018-08-11 MED ORDER — SODIUM CHLORIDE 0.9% FLUSH
10.0000 mL | INTRAVENOUS | Status: DC | PRN
  Administered 2018-08-17: 10 mL
  Filled 2018-08-11: qty 40

## 2018-08-11 MED ORDER — ALBUTEROL SULFATE (2.5 MG/3ML) 0.083% IN NEBU
2.5000 mg | INHALATION_SOLUTION | Freq: Three times a day (TID) | RESPIRATORY_TRACT | Status: DC
  Administered 2018-08-12 – 2018-08-20 (×25): 2.5 mg via RESPIRATORY_TRACT
  Filled 2018-08-11 (×25): qty 3

## 2018-08-11 MED ORDER — AZITHROMYCIN 250 MG PO TABS
500.0000 mg | ORAL_TABLET | Freq: Every day | ORAL | Status: DC
  Administered 2018-08-11 – 2018-08-12 (×2): 500 mg via ORAL
  Filled 2018-08-11 (×2): qty 2

## 2018-08-11 NOTE — Progress Notes (Signed)
PHARMACIST - PHYSICIAN COMMUNICATION  CONCERNING: Antibiotic IV to Oral Route Change Policy  RECOMMENDATION: This patient is receiving azithromycin by the intravenous route.  Based on criteria approved by the Pharmacy and Therapeutics Committee, the antibiotic(s) is/are being converted to the equivalent oral dose form(s).   DESCRIPTION: These criteria include:  Patient being treated for a respiratory tract infection, urinary tract infection, cellulitis or clostridium difficile associated diarrhea if on metronidazole  The patient is not neutropenic and does not exhibit a GI malabsorption state  The patient is eating (either orally or via tube) and/or has been taking other orally administered medications for a least 24 hours  The patient is improving clinically and has a Tmax < 100.5  If you have questions about this conversion, please contact the Pharmacy Department  []   [phone] )  [facility] []   [phone] )  [facility] []   [phone] )  [facility] []   [phone] )  [facility] [x]   [phone] )  [place], [place].D  08/11/2018 11:02 AM

## 2018-08-11 NOTE — Progress Notes (Signed)
PROGRESS NOTE  [name] White WUJ:[id number] DOB: June 30, 1955 DOA: 08/07/2018 PCP: [name], MD   LOS: 4 days   Brief narrative:  [name] White is a 63 y.o. female with medical history significant of COPD, depression, GERD, hypertension, history of pulmonary embolism, atrial fibrillation and hypothyroidism presented to the hospital with shortness of breath and cough for 1 week, failed outpatient treatment with antibiotics.  Patient was seen in the ER evaluated and was found to have marked expiratory wheezing.  She was given CPAP which seemed to help.  Despite 10 mg of albuterol 0.5 mg of Atrovent 125 mg Solu-Medrol and 2 g of magnesium she continued to wheeze and remained hypoxic with respiratory distress so she was being admitted to the hospital for COPD exacerbation.  Chest x-ray showed clearing of the previously seen bibasilar infiltrates.  Assessment/Plan:  Principal Problem:   COPD with acute exacerbation (HCC) Active Problems:   Morbid obesity (HCC)   HYPERTENSION, BENIGN SYSTEMIC   GASTROESOPHAGEAL REFLUX, NO ESOPHAGITIS   Atrial flutter (HCC)   Hypokalemia   Chronic respiratory failure with hypoxia (HCC)   Leucocytosis   Acute on chronic respiratory failure secondary to acute COPD exacerbation: Patient uses 2 litres of oxygen at baseline.  Continue oxygen, nebulizer IV steroids.  We will continue to decrease steroids.  Continue budesonide nebulizer, Protonix and Flonase.  On Rocephin and Zithromax for pneumonia.  Hypertension:  on Cardizem.    History of exertional A. fib and a flutter: Continued on Cardizem, flecainide.  Continue Xarelto for anticoagulation.  Concern for left hip bruise.  Xarelto on hold x 2days.  Restarted.  Hypokalemia: Resolved.  Leukocytosis:Probably residual from recent pneumonia use of IV steroids.  Will monitor.  GERD: Add Protonix  Debility, deconditioning.  Patient has been seen by physical therapy who recommended skilled  nursing facility placement.  VTE Prophylaxis: Xarelto  Code Status: Full code  Family Communication: No family members were available at bedside.  Disposition Plan: Patient has been evaluated by physical therapy and recommended skilled nursing facility placement for rehab.    Consultants:  None  Procedures:  None  Antibiotics: Anti-infectives (From admission, onward)   Start     Dose/Rate Route Frequency Ordered Stop   08/11/18 2200  azithromycin (ZITHROMAX) tablet 500 mg     500 mg Oral Daily at bedtime 08/11/18 1058     08/08/18 0215  cefTRIAXone (ROCEPHIN) 1 g in sodium chloride 0.9 % 100 mL IVPB     1 g 200 mL/hr over 30 Minutes Intravenous Daily at bedtime 08/08/18 0206     08/08/18 0215  azithromycin (ZITHROMAX) 500 mg in sodium chloride 0.9 % 250 mL IVPB  Status:  Discontinued     500 mg 250 mL/hr over 60 Minutes Intravenous Daily at bedtime 08/08/18 0206 08/11/18 1058      Subjective: Patient feels little better with coughing wheezing and dyspnea.  She however feels weak.  Objective: Vitals:   08/11/18 0638 08/11/18 0905  BP: (!) 156/78   Pulse: 74   Resp:    Temp:    SpO2:  98%    Intake/Output Summary (Last 24 hours) at 08/11/2018 1127 Last data filed at 08/11/2018 0200 Gross per 24 hour  Intake 1070 ml  Output -  Net 1070 ml   Filed Weights   08/08/18 0224  Weight: (!) 137.7 kg   Body mass index is 50.52 kg/m.   Physical Exam: General:  Average built, not in obvious distress.  Morbidly obese.  On nasal cannula HENT: Normocephalic, pupils equally reacting to light and accommodation.  No scleral pallor or icterus noted. Oral mucosa is moist.  Chest: Diminished breath sounds bilaterally with mild expiratory wheezes. CVS: S1 &S2 heard. No murmur.  Regular rate and rhythm. Abdomen: Soft, nontender, nondistended.  Bowel sounds are heard.  Liver is not palpable, no abdominal mass palpated Extremities: No cyanosis, clubbing or edema.  Peripheral  pulses are palpable. Psych: Alert, awake and oriented, normal mood CNS:  No cranial nerve deficits.  Power equal in all extremities.  No sensory deficits noted.  No cerebellar signs.   Skin: Warm and dry.  No rashes noted.   Data Review: I have personally reviewed the following laboratory data and studies,  CBC: Recent Labs  Lab 08/07/18 2130 08/08/18 0435 08/09/18 0423 08/10/18 0415  WBC 11.3* 10.6* 21.4* 19.0*  NEUTROABS 8.4*  --   --   --   HGB 12.2 11.5* 11.1* 10.9*  HCT 39.6 38.5 36.6 36.1  MCV 96.1 98.5 98.4 100.0  PLT 443* 417* 462* 419*   Basic Metabolic Panel: Recent Labs  Lab 08/07/18 2130 08/08/18 0435 08/09/18 0423 08/10/18 0415  NA 141 140 140 138  K 2.9* 3.6 3.8 4.1  CL 107 105 107 106  CO2 24 19* 23 24  GLUCOSE 140* 276* 145* 142*  BUN 11 12 16 19   CREATININE 0.58 0.70 0.58 0.66  CALCIUM 8.5* 8.4* 8.6* 8.5*   Liver Function Tests: Recent Labs  Lab 08/08/18 0435  AST 29  ALT 16  ALKPHOS 78  BILITOT 0.4  PROT 7.3  ALBUMIN 3.3*   No results for input(s): LIPASE, AMYLASE in the last 168 hours. No results for input(s): AMMONIA in the last 168 hours. Cardiac Enzymes: No results for input(s): CKTOTAL, CKMB, CKMBINDEX, TROPONINI in the last 168 hours. BNP (last 3 results) Recent Labs    07/01/18 0836  BNP 22.4    ProBNP (last 3 results) No results for input(s): PROBNP in the last 8760 hours.  CBG: No results for input(s): GLUCAP in the last 168 hours. No results found for this or any previous visit (from the past 240 hour(s)).   Studies: No results found.  Scheduled Meds: . albuterol  2.5 mg Nebulization Q6H  . azithromycin  500 mg Oral QHS  . budesonide (PULMICORT) nebulizer solution  0.25 mg Nebulization BID  . cycloSPORINE  1 drop Both Eyes BID  . diclofenac sodium  2 g Topical QID  . diltiazem  120 mg Oral Daily  . flecainide  100 mg Oral BID  . fluticasone  1 spray Each Nare Daily  . ketotifen  1 drop Both Eyes BID  .  loratadine  10 mg Oral Daily  . methylPREDNISolone (SOLU-MEDROL) injection  40 mg Intravenous Q8H  . pantoprazole  40 mg Oral BID  . rivaroxaban  20 mg Oral QPC supper  . venlafaxine XR  150 mg Oral Q breakfast    Continuous Infusions: . cefTRIAXone (ROCEPHIN)  IV Stopped (08/10/18 2302)     Flora Lipps, MD  [facility] 08/11/2018

## 2018-08-12 LAB — BASIC METABOLIC PANEL
Anion gap: 6 (ref 5–15)
BUN: 16 mg/dL (ref 8–23)
CO2: 28 mmol/L (ref 22–32)
Calcium: 7.6 mg/dL — ABNORMAL LOW (ref 8.9–10.3)
Chloride: 106 mmol/L (ref 98–111)
Creatinine, Ser: 0.59 mg/dL (ref 0.44–1.00)
GFR calc Af Amer: >60 mL/min (ref >60)
GFR calc non Af Amer: >60 mL/min (ref >60)
Glucose, Bld: 111 mg/dL — ABNORMAL HIGH (ref 70–99)
Potassium: 3.5 mmol/L (ref 3.5–5.1)
Sodium: 140 mmol/L (ref 135–145)

## 2018-08-12 LAB — CBC
HCT: 33.8 % — ABNORMAL LOW (ref 36.0–46.0)
Hemoglobin: 10.3 g/dL — ABNORMAL LOW (ref 12.0–15.0)
MCH: 29.7 pg (ref 26.0–34.0)
MCHC: 30.5 g/dL (ref 30.0–36.0)
MCV: 97.4 fL (ref 80.0–100.0)
NRBC: 0 % (ref 0.0–0.2)
Platelets: 354 10*3/uL (ref 150–400)
RBC: 3.47 MIL/uL — ABNORMAL LOW (ref 3.87–5.11)
RDW: 16 % — ABNORMAL HIGH (ref 11.5–15.5)
WBC: 13.9 10*3/uL — ABNORMAL HIGH (ref 4.0–10.5)

## 2018-08-12 MED ORDER — METOPROLOL TARTRATE 25 MG PO TABS
12.5000 mg | ORAL_TABLET | Freq: Two times a day (BID) | ORAL | Status: DC
  Administered 2018-08-12 – 2018-08-13 (×3): 12.5 mg via ORAL
  Filled 2018-08-12 (×3): qty 1

## 2018-08-12 MED ORDER — METHYLPREDNISOLONE SODIUM SUCC 40 MG IJ SOLR: 40.0000 mg | Freq: Every day | INTRAMUSCULAR | Status: DC

## 2018-08-12 NOTE — Progress Notes (Signed)
PROGRESS NOTE  [name] White AJO:[id number] DOB: December 12, 1955 DOA: 08/07/2018 PCP: [name] Dawn, MD   LOS: 5 days   Brief narrative:  [name] White is a 63 y.o. female with medical history significant of COPD, depression, GERD, hypertension, history of pulmonary embolism, atrial fibrillation and hypothyroidism presented to the hospital with shortness of breath and cough for 1 week, failed outpatient treatment with antibiotics.  Patient was seen in the ER evaluated and was found to have marked expiratory wheezing.  She was given CPAP which seemed to help.  Despite 10 mg of albuterol 0.5 mg of Atrovent 125 mg Solu-Medrol and 2 g of magnesium she continued to wheeze and remained hypoxic with respiratory distress so she was being admitted to the hospital for COPD exacerbation.  Chest x-ray showed clearing of the previously seen bibasilar infiltrates.  Patient was continued on antibiotic oxygen nebulizer inhaled steroids and IV steroids with gradual improvement in her breathing status.  She however had dyspnea on exertion debility and deconditioning and physical therapy was consulted who recommended short-term skilled nursing facility placement.  Assessment/Plan:  Principal Problem:   COPD with acute exacerbation (HCC) Active Problems:   Morbid obesity (HCC)   HYPERTENSION, BENIGN SYSTEMIC   GASTROESOPHAGEAL REFLUX, NO ESOPHAGITIS   Atrial flutter (HCC)   Hypokalemia   Chronic respiratory failure with hypoxia (HCC)   Leucocytosis   Acute on chronic respiratory failure secondary to acute COPD exacerbation:Recent pneumonia.  Improving wheezes and dyspnea.  Patient uses 2 litres of oxygen at baseline.  Continue oxygen, nebulizer IV steroids.  We will continue to decrease steroids and change to po by am.  Continue budesonide nebulizer, Protonix and Flonase.  On Rocephin and Zithromax for pneumonia.  Hypertension:  on Cardizem.    History of exertional A. fib and a flutter: Continued on  Cardizem, flecainide.  Continue Xarelto for anticoagulation.  Concern for left hip bruise.  Xarelto on hold x 2days.  Restarted.  Hypokalemia: Resolved.  Leukocytosis: Trending down. Probably residual from recent pneumonia use of IV steroids.  Will monitor.  GERD Protonix  Debility, deconditioning.  Awaiting for skilled nursing facility placement.  Physical therapy has seen the patient.  VTE Prophylaxis: Xarelto  Code Status: Full code  Family Communication: No family members were available at bedside.  Disposition Plan: Skilled nursing facility for short-term rehab, social workers been consulted. Medically stable for disposition.  Consultants:  None  Procedures:  None  Antibiotics: Anti-infectives (From admission, onward)   Start     Dose/Rate Route Frequency Ordered Stop   08/11/18 2200  azithromycin (ZITHROMAX) tablet 500 mg     500 mg Oral Daily at bedtime 08/11/18 1058     08/08/18 0215  cefTRIAXone (ROCEPHIN) 1 g in sodium chloride 0.9 % 100 mL IVPB     1 g 200 mL/hr over 30 Minutes Intravenous Daily at bedtime 08/08/18 0206     08/08/18 0215  azithromycin (ZITHROMAX) 500 mg in sodium chloride 0.9 % 250 mL IVPB  Status:  Discontinued     500 mg 250 mL/hr over 60 Minutes Intravenous Daily at bedtime 08/08/18 0206 08/11/18 1058      Subjective: Patient complains of generalized weakness but feels better with breathing and wheezing.  Cough has improved.  Objective: Vitals:   08/12/18 0508 08/12/18 0629  BP: (!) 154/92   Pulse: 81   Resp:    Temp:    SpO2: 99% 98%    Intake/Output Summary (Last 24 hours) at 08/12/2018 0758 Last data  filed at 08/12/2018 0500 Gross per 24 hour  Intake 480 ml  Output -  Net 480 ml   Filed Weights   08/08/18 0224  Weight: (!) 137.7 kg   Body mass index is 50.52 kg/m.   Physical Exam: General:  Average built, not in obvious distress.  Morbidly obese on nasal cannula HENT: Normocephalic, pupils equally reacting to  light and accommodation.  No scleral pallor or icterus noted. Oral mucosa is moist.  Chest: Diminished breath sounds bilaterally.  Minimal rhonchi noted. CVS: S1 &S2 heard. No murmur.  Regular rate and rhythm. Abdomen: Soft, nontender, nondistended.  Bowel sounds are heard.  Liver is not palpable, no abdominal mass palpated Extremities: No cyanosis, clubbing or edema.  Peripheral pulses are palpable. Psych: Alert, awake and oriented, normal mood CNS:  No cranial nerve deficits.  Power equal in all extremities.  No sensory deficits noted.  No cerebellar signs.   Skin: Warm and dry.  No rashes noted.   Data Review: I have personally reviewed the following laboratory data and studies,  CBC: Recent Labs  Lab 08/07/18 2130 08/08/18 0435 08/09/18 0423 08/10/18 0415 08/12/18 0418  WBC 11.3* 10.6* 21.4* 19.0* 13.9*  NEUTROABS 8.4*  --   --   --   --   HGB 12.2 11.5* 11.1* 10.9* 10.3*  HCT 39.6 38.5 36.6 36.1 33.8*  MCV 96.1 98.5 98.4 100.0 97.4  PLT 443* 417* 462* 414* 588   Basic Metabolic Panel: Recent Labs  Lab 08/07/18 2130 08/08/18 0435 08/09/18 0423 08/10/18 0415 08/12/18 0418  NA 141 140 140 138 140  K 2.9* 3.6 3.8 4.1 3.5  CL 107 105 107 106 106  CO2 24 19* 23 24 28   GLUCOSE 140* 276* 145* 142* 111*  BUN 11 12 16 19 16   CREATININE 0.58 0.70 0.58 0.66 0.59  CALCIUM 8.5* 8.4* 8.6* 8.5* 7.6*   Liver Function Tests: Recent Labs  Lab 08/08/18 0435  AST 29  ALT 16  ALKPHOS 78  BILITOT 0.4  PROT 7.3  ALBUMIN 3.3*   No results for input(s): LIPASE, AMYLASE in the last 168 hours. No results for input(s): AMMONIA in the last 168 hours. Cardiac Enzymes: No results for input(s): CKTOTAL, CKMB, CKMBINDEX, TROPONINI in the last 168 hours. BNP (last 3 results) Recent Labs    07/01/18 0836  BNP 22.4    ProBNP (last 3 results) No results for input(s): PROBNP in the last 8760 hours.  CBG: No results for input(s): GLUCAP in the last 168 hours. No results found for  this or any previous visit (from the past 240 hour(s)).   Studies: No results found.  Scheduled Meds: . albuterol  2.5 mg Nebulization TID  . azithromycin  500 mg Oral QHS  . budesonide (PULMICORT) nebulizer solution  0.25 mg Nebulization BID  . cycloSPORINE  1 drop Both Eyes BID  . diclofenac sodium  2 g Topical QID  . diltiazem  120 mg Oral Daily  . flecainide  100 mg Oral BID  . fluticasone  1 spray Each Nare Daily  . ketotifen  1 drop Both Eyes BID  . loratadine  10 mg Oral Daily  . methylPREDNISolone (SOLU-MEDROL) injection  40 mg Intravenous Q12H  . pantoprazole  40 mg Oral BID  . rivaroxaban  20 mg Oral QPC supper  . venlafaxine XR  150 mg Oral Q breakfast    Continuous Infusions: . cefTRIAXone (ROCEPHIN)  IV 1 g (08/11/18 2107)     Flora Lipps, MD  [facility] 08/12/2018

## 2018-08-13 MED ORDER — METOPROLOL TARTRATE 25 MG PO TABS
25.0000 mg | ORAL_TABLET | Freq: Two times a day (BID) | ORAL | Status: DC
  Administered 2018-08-13 – 2018-08-20 (×14): 25 mg via ORAL
  Filled 2018-08-13 (×14): qty 1

## 2018-08-13 MED ORDER — PREDNISONE 20 MG PO TABS
40.0000 mg | ORAL_TABLET | Freq: Every day | ORAL | Status: DC
  Administered 2018-08-13 – 2018-08-15 (×3): 40 mg via ORAL
  Filled 2018-08-13 (×3): qty 2

## 2018-08-13 NOTE — NC FL2 (Signed)
[place] MEDICAID FL2 LEVEL OF CARE SCREENING TOOL     IDENTIFICATION  Patient Name: [name] White Birthdate: [birth year] Sex: female Admission Date (Current Location): 08/07/2018  [facility] and [place] Number:  Herbalist and Address:  [facility],  [place] [address] [name]      Provider Number: [id number]  Attending Physician Name and Address:  Flora Lipps, MD  Relative Name and Phone Number:       Current Level of Care: Hospital Recommended Level of Care: [place] Prior Approval Number:    Date Approved/Denied:   PASRR Number: pending  Discharge Plan: SNF    Current Diagnoses: Patient Active Problem List   Diagnosis Date Noted  . COPD with acute exacerbation ([place]) 08/07/2018  . Leucocytosis 08/07/2018  . Sleep apnea 07/05/2018  . Chronic respiratory failure with hypoxia ([place]) 07/05/2018  . Osteoarthritis 03/02/2018  . Viral illness 02/13/2018  . Nausea 02/13/2018  . Left medial tibial stress syndrome 12/26/2017  . Hematoma 11/27/2017  . Severe episode of recurrent major depressive disorder, without psychotic features ([place])   . MDD (major depressive disorder), recurrent episode, severe ([place]) 09/07/2015  . Atrial flutter ([place]) 07/29/2015  . History of atrial flutter 06/26/2015  . Chest pain 03/22/2015  . Asthma 11/12/2014  . History of hypertension 11/05/2014  . Atrial fibrillation, chronic 11/03/2014  . Chronic anticoagulation 11/03/2014  . History of pulmonary embolus (PE) 11/03/2014  . Hypokalemia 11/03/2014  . Non-traumatic rhabdomyolysis 11/03/2014  . Major depressive disorder 11/03/2014  . SOB (shortness of breath)   . MDD (major depressive disorder), recurrent severe, without psychosis ([place]) 03/05/2014  . Pulmonary embolism ([place]) 12/27/2013  . Vocal cord dysfunction 11/12/2013  . Mixed headache 01/06/2012  . OVERACTIVE BLADDER 04/18/2008  . Osteoarthritis of both knees 04/18/2008  .  POLYARTHRITIS 09/14/2007  . Morbid obesity ([place]) 08/24/2006  . RESTLESS LEGS SYNDROME 08/24/2006  . HYPERTENSION, BENIGN SYSTEMIC 08/24/2006  . RHINITIS, ALLERGIC 08/24/2006  . GASTROESOPHAGEAL REFLUX, NO ESOPHAGITIS 08/24/2006    Orientation RESPIRATION BLADDER Height & Weight     Self, Time, Situation, Place  O2(2L) Continent Weight: (!) 303 lb 9 oz (137.7 kg) Height:  5\' 5"  (165.1 cm)  BEHAVIORAL SYMPTOMS/MOOD NEUROLOGICAL BOWEL NUTRITION STATUS      Continent Diet(heart healthy diet)  AMBULATORY STATUS COMMUNICATION OF NEEDS Skin   Limited Assist Verbally Normal                       Personal Care Assistance Level of Assistance  Bathing, Feeding, Dressing Bathing Assistance: Limited assistance Feeding assistance: Independent Dressing Assistance: Limited assistance     Functional Limitations Info  Sight, Hearing, Speech Sight Info: Adequate Hearing Info: Adequate Speech Info: Adequate    SPECIAL CARE FACTORS FREQUENCY  PT (By licensed PT), OT (By licensed OT)     PT Frequency: 5x OT Frequency: 5x            Contractures Contractures Info: Not present    Additional Factors Info  Code Status, Allergies, Psychotropic Code Status Info: full code Allergies Info: Caffeine, Vicodin Hydrocodone-acetaminophen, Hydralazine Hcl, Hydrocodone-acetaminophen, Ciprofloxacin, Erythromycin, Lisinopril, Oxycodone, Sulfamethoxazole-trimethoprim Psychotropic Info: effexor         Current Medications (08/13/2018):  This is the current hospital active medication list Current Facility-Administered Medications  Medication Dose Route Frequency Provider Last Rate Last Dose  . acetaminophen (TYLENOL) tablet 1,000 mg  1,000 mg Oral Daily PRN Elwyn Reach, MD   1,000 mg  at 08/13/18 0930  . albuterol (PROVENTIL) (2.5 MG/3ML) 0.083% nebulizer solution 2.5 mg  2.5 mg Nebulization Q3H PRN Pokhrel, Laxman, MD   2.5 mg at 08/09/18 1039  . albuterol (PROVENTIL) (2.5 MG/3ML) 0.083%  nebulizer solution 2.5 mg  2.5 mg Nebulization TID Pokhrel, Laxman, MD   2.5 mg at 08/13/18 0902  . budesonide (PULMICORT) nebulizer solution 0.25 mg  0.25 mg Nebulization BID Pokhrel, Laxman, MD   0.25 mg at 08/13/18 0902  . cycloSPORINE (RESTASIS) 0.05 % ophthalmic emulsion 1 drop  1 drop Both Eyes BID Elwyn Reach, MD   1 drop at 08/13/18 7494  . diclofenac sodium (VOLTAREN) 1 % transdermal gel 2 g  2 g Topical QID Elwyn Reach, MD   2 g at 08/12/18 2138  . diltiazem (CARDIZEM CD) 24 hr capsule 120 mg  120 mg Oral Daily Gala Romney L, MD   120 mg at 08/13/18 0923  . diphenhydrAMINE (BENADRYL) capsule 25 mg  25 mg Oral Q6H PRN Elwyn Reach, MD   25 mg at 08/09/18 0359  . flecainide (TAMBOCOR) tablet 100 mg  100 mg Oral BID Gala Romney L, MD   100 mg at 08/13/18 0923  . fluticasone (FLONASE) 50 MCG/ACT nasal spray 1 spray  1 spray Each Nare Daily Elwyn Reach, MD   1 spray at 08/13/18 0923  . guaiFENesin-codeine 100-10 MG/5ML solution 10 mL  10 mL Oral Q4H PRN Pokhrel, Laxman, MD   10 mL at 08/13/18 0933  . hydrOXYzine (ATARAX/VISTARIL) tablet 25 mg  25 mg Oral BID PRN Elwyn Reach, MD   25 mg at 08/09/18 0144  . ketotifen (ZADITOR) 0.025 % ophthalmic solution 1 drop  1 drop Both Eyes BID Elwyn Reach, MD   1 drop at 08/13/18 0935  . loratadine (CLARITIN) tablet 10 mg  10 mg Oral Daily Elwyn Reach, MD   10 mg at 08/13/18 0923  . metoprolol tartrate (LOPRESSOR) tablet 25 mg  25 mg Oral BID Pokhrel, Laxman, MD      . ondansetron (ZOFRAN) tablet 4 mg  4 mg Oral Q6H PRN Elwyn Reach, MD       Or  . ondansetron (ZOFRAN) injection 4 mg  4 mg Intravenous Q6H PRN Gala Romney L, MD      . pantoprazole (PROTONIX) EC tablet 40 mg  40 mg Oral BID Pokhrel, Laxman, MD   40 mg at 08/13/18 0923  . predniSONE (DELTASONE) tablet 40 mg  40 mg Oral Q breakfast Pokhrel, Laxman, MD   40 mg at 08/13/18 0923  . rivaroxaban (XARELTO) tablet 20 mg  20 mg Oral QPC supper  Pokhrel, Laxman, MD   20 mg at 08/12/18 1718  . sodium chloride flush (NS) 0.9 % injection 10-40 mL  10-40 mL Intracatheter PRN Pokhrel, Laxman, MD      . venlafaxine XR (EFFEXOR-XR) 24 hr capsule 150 mg  150 mg Oral Q breakfast Elwyn Reach, MD   150 mg at 08/13/18 4967     Discharge Medications: Please see discharge summary for a list of discharge medications.  Relevant Imaging Results:  Relevant Lab Results:   Additional Information SS# [id number]  Nila Nephew, LCSW

## 2018-08-13 NOTE — Progress Notes (Signed)
Physical Therapy Treatment Patient Details Name: [name] White MRN: [id number] DOB: 04-27-1956 Today's Date: 08/13/2018    History of Present Illness 63 y.o. female with medical history significant of COPD, depression, GERD, hypertension, pulmonary embolism, atrial fibrillation and hypothyroidism and admitted for Acute on chronic respiratory failure secondary to acute COPD exacerbation    PT Comments    Pt fatigues quickly with minimal activity.  Pt assisted with ambulating short distance and required standing rest break.  Continue to recommend SNF upon d/c.   Follow Up Recommendations  SNF     Equipment Recommendations  None recommended by PT    Recommendations for Other Services       Precautions / Restrictions Precautions Precautions: Fall Precaution Comments: chronic 2L O2    Mobility  Bed Mobility Overal bed mobility: Needs Assistance Bed Mobility: Supine to Sit;Sit to Supine     Supine to sit: Supervision;HOB elevated Sit to supine: Supervision;HOB elevated      Transfers Overall transfer level: Needs assistance Equipment used: Straight cane Transfers: Sit to/from Stand Sit to Stand: Min guard         General transfer comment: verbal cues for safe technique  Ambulation/Gait Ambulation/Gait assistance: Min assist Gait Distance (Feet): 45 Feet(x2) Assistive device: Straight cane Gait Pattern/deviations: Step-to pattern;Antalgic;Decreased stance time - left     General Gait Details: verbal cues for safety with SPC, distance to tolerance, pt required standing rest break, remained on 2L O2 [place] and Spo2 93%   Stairs             Wheelchair Mobility    Modified Rankin (Stroke Patients Only)       Balance                                            Cognition Arousal/Alertness: Awake/alert Behavior During Therapy: WFL for tasks assessed/performed Overall Cognitive Status: Within Functional Limits for tasks assessed                                         Exercises      General Comments        Pertinent Vitals/Pain Pain Assessment: No/denies pain Pain Intervention(s): Monitored during session;Repositioned    Home Living                      Prior Function            PT Goals (current goals can now be found in the care plan section) Progress towards PT goals: Progressing toward goals    Frequency    Min 2X/week      PT Plan Current plan remains appropriate    Co-evaluation              AM-PAC PT "6 Clicks" Mobility   Outcome Measure  Help needed turning from your back to your side while in a flat bed without using bedrails?: A Little Help needed moving from lying on your back to sitting on the side of a flat bed without using bedrails?: A Little Help needed moving to and from a bed to a chair (including a wheelchair)?: A Little Help needed standing up from a chair using your arms (e.g., wheelchair or bedside chair)?: A Little Help needed to walk in hospital  room?: A Little Help needed climbing 3-5 steps with a railing? : A Lot 6 Click Score: 17    End of Session Equipment Utilized During Treatment: Gait belt;Oxygen Activity Tolerance: Patient limited by fatigue Patient left: in bed;with call bell/phone within reach Nurse Communication: Mobility status PT Visit Diagnosis: Difficulty in walking, not elsewhere classified (R26.2)     Time: 6999-6722 PT Time Calculation (min) (ACUTE ONLY): 11 min  Charges:  $Gait Training: 8-22 mins                    Carmelia Bake, PT, DPT Acute Rehabilitation Services Office: [phone] Pager: [phone]  Trena Platt 08/13/2018, 3:37 PM

## 2018-08-13 NOTE — Progress Notes (Addendum)
PROGRESS NOTE  [name] White VOH:[id number] DOB: 10-11-1955 DOA: 08/07/2018 PCP: [name], MD   LOS: 6 days   Brief narrative:  [name] White is a 63 y.o. female with medical history significant of COPD, depression, GERD, hypertension, history of pulmonary embolism, atrial fibrillation and hypothyroidism presented to the hospital with shortness of breath and cough for 1 week, failed outpatient treatment with antibiotics.  Patient was seen in the ER evaluated and was found to have marked expiratory wheezing.  She was given CPAP which seemed to help.  Despite 10 mg of albuterol 0.5 mg of Atrovent 125 mg Solu-Medrol and 2 g of magnesium she continued to wheeze and remained hypoxic with respiratory distress so she was being admitted to the hospital for COPD exacerbation.  Chest x-ray showed clearing of the previously seen bibasilar infiltrates.  Patient was continued on antibiotics, oxygen nebulizer, inhaled steroids and IV steroids with gradual improvement in her breathing status.  She however had dyspnea on exertion, debility and deconditioning and physical therapy was consulted who recommended short-term skilled nursing facility placement.  Assessment/Plan:  Principal Problem:   COPD with acute exacerbation (HCC) Active Problems:   Morbid obesity (HCC)   HYPERTENSION, BENIGN SYSTEMIC   GASTROESOPHAGEAL REFLUX, NO ESOPHAGITIS   Atrial flutter (HCC)   Hypokalemia   Chronic respiratory failure with hypoxia (HCC)   Leucocytosis   Acute on chronic respiratory failure secondary to acute COPD exacerbation:Recent pneumonia.  Improving wheezes and dyspnea.  Patient uses 2 litres of oxygen at baseline.  Continue oxygen, nebulizer, inhaled steroid.  Will change IV steroid to oral steroid from today.  Continue Protonix and Flonase.  On Rocephin and Zithromax for pneumonia.  Will discontinue antibiotics from today.  Hypertension:  on Cardizem.  Patient remained accelerated.  Added  low-dose metoprolol since yesterday.  Will adjust the doses as necessary.  He does have allergy to hydralazine and lisinopril.  History of A. fib and a flutter: Continue on Cardizem, flecainide.  Continue Xarelto for anticoagulation.  No acute issues  Hypokalemia: Resolved.  Leukocytosis: Trending down. Probably residual from recent pneumonia  and the use of IV steroids.  Will monitor.  Check CBC in a.m.  GERD Protonix  Debility, deconditioning.  Awaiting for skilled nursing facility placement.  Physical therapy has seen the patient.  Social workers have been consulted.  VTE Prophylaxis: Xarelto  Code Status: Full code  Family Communication: No one available at bedside.  Disposition Plan: Skilled nursing facility for short-term rehab, social workers been consulted. Medically stable for disposition.  Consultants:  None  Procedures:  None  Antibiotics: Anti-infectives (From admission, onward)   Start     Dose/Rate Route Frequency Ordered Stop   08/11/18 2200  azithromycin (ZITHROMAX) tablet 500 mg     500 mg Oral Daily at bedtime 08/11/18 1058     08/08/18 0215  cefTRIAXone (ROCEPHIN) 1 g in sodium chloride 0.9 % 100 mL IVPB     1 g 200 mL/hr over 30 Minutes Intravenous Daily at bedtime 08/08/18 0206     08/08/18 0215  azithromycin (ZITHROMAX) 500 mg in sodium chloride 0.9 % 250 mL IVPB  Status:  Discontinued     500 mg 250 mL/hr over 60 Minutes Intravenous Daily at bedtime 08/08/18 0206 08/11/18 1058      Subjective: Patient continues to improve.  Denies increasing shortness of breath chest pain palpitation, nausea or vomiting.  Awaiting for rehab placement.  Objective: Vitals:   08/13/18 0425 08/13/18 0904  BP: Marland Kitchen)  133/100   Pulse: 71   Resp: 18   Temp: 98.5 F (36.9 C)   SpO2: 98% 99%   No intake or output data in the 24 hours ending 08/13/18 0935 Filed Weights   08/08/18 0224  Weight: (!) 137.7 kg   Body mass index is 50.52 kg/m.   Physical  Exam: General: Morbidly obese.  On nasal cannula.  Not in obvious distress.  HENT: Normocephalic, pupils equally reacting to light and accommodation.  No scleral pallor or icterus noted. Oral mucosa is moist.  Chest:   diminished breath sounds bilaterally.  Occasional expiratory rhonchi CVS: S1 &S2 heard. No murmur.  Regular rate and rhythm. Abdomen: Soft, nontender, nondistended.  Bowel sounds are heard.  Liver is not palpable, no abdominal mass palpated Extremities: No cyanosis, clubbing or edema.  Peripheral pulses are palpable. Psych: Alert, awake and oriented, normal mood CNS:  No cranial nerve deficits.  Power equal in all extremities.  No sensory deficits noted.  No cerebellar signs.   Skin: Warm and dry.  No rashes noted.  Data Review: I have personally reviewed the following laboratory data and studies,  CBC: Recent Labs  Lab 08/07/18 2130 08/08/18 0435 08/09/18 0423 08/10/18 0415 08/12/18 0418  WBC 11.3* 10.6* 21.4* 19.0* 13.9*  NEUTROABS 8.4*  --   --   --   --   HGB 12.2 11.5* 11.1* 10.9* 10.3*  HCT 39.6 38.5 36.6 36.1 33.8*  MCV 96.1 98.5 98.4 100.0 97.4  PLT 443* 417* 462* 414* 938   Basic Metabolic Panel: Recent Labs  Lab 08/07/18 2130 08/08/18 0435 08/09/18 0423 08/10/18 0415 08/12/18 0418  NA 141 140 140 138 140  K 2.9* 3.6 3.8 4.1 3.5  CL 107 105 107 106 106  CO2 24 19* 23 24 28   GLUCOSE 140* 276* 145* 142* 111*  BUN 11 12 16 19 16   CREATININE 0.58 0.70 0.58 0.66 0.59  CALCIUM 8.5* 8.4* 8.6* 8.5* 7.6*   Liver Function Tests: Recent Labs  Lab 08/08/18 0435  AST 29  ALT 16  ALKPHOS 78  BILITOT 0.4  PROT 7.3  ALBUMIN 3.3*   No results for input(s): LIPASE, AMYLASE in the last 168 hours. No results for input(s): AMMONIA in the last 168 hours. Cardiac Enzymes: No results for input(s): CKTOTAL, CKMB, CKMBINDEX, TROPONINI in the last 168 hours. BNP (last 3 results) Recent Labs    07/01/18 0836  BNP 22.4    ProBNP (last 3 results) No  results for input(s): PROBNP in the last 8760 hours.  CBG: No results for input(s): GLUCAP in the last 168 hours. No results found for this or any previous visit (from the past 240 hour(s)).   Studies: No results found.  Scheduled Meds: . albuterol  2.5 mg Nebulization TID  . azithromycin  500 mg Oral QHS  . budesonide (PULMICORT) nebulizer solution  0.25 mg Nebulization BID  . cycloSPORINE  1 drop Both Eyes BID  . diclofenac sodium  2 g Topical QID  . diltiazem  120 mg Oral Daily  . flecainide  100 mg Oral BID  . fluticasone  1 spray Each Nare Daily  . ketotifen  1 drop Both Eyes BID  . loratadine  10 mg Oral Daily  . metoprolol tartrate  12.5 mg Oral BID  . pantoprazole  40 mg Oral BID  . predniSONE  40 mg Oral Q breakfast  . rivaroxaban  20 mg Oral QPC supper  . venlafaxine XR  150 mg Oral Q  breakfast    Continuous Infusions: . cefTRIAXone (ROCEPHIN)  IV 1 g (08/12/18 2131)     Flora Lipps, MD  [facility] 08/13/2018

## 2018-08-13 NOTE — Clinical Social Work Note (Signed)
Clinical Social Work Assessment  Patient Details  Name: [name] White MRN: [id number] Date of Birth: [birth year]  Date of referral:  08/13/18               Reason for consult:  Facility Placement                Permission sought to share information with:    Permission granted to share information::     Name::        Agency::     Relationship::     Contact Information:     Housing/Transportation Living arrangements for the past 2 months:  Single Family Home Source of Information:  Patient, Medical Team Patient Interpreter Needed:  None Criminal Activity/Legal Involvement Pertinent to Current Situation/Hospitalization:  No - Comment as needed Significant Relationships:  Siblings Lives with:  Siblings Do you feel safe going back to the place where you live?  Yes Need for family participation in patient care:  No (Coment)(pt's sister involved)  Care giving concerns:  Pt admitted from home where she resides with her sister. States she uses a cane to ambulate at baseline but typically does not need any assistance.  Pt has COPD and is disabled. Uses o2 at home. Admitted for COPD exacerbation.    Social Worker assessment / plan:  CSW consulted for SNF placement- met with pt at bedside where she was alert and oriented. States she has been discussing short term rehab with therapist and attending and feels she agrees she would benefit from SNF prior to returning home. "I don't usually need this much help, I got so sick I'm much weaker than normal." Requested [facility] is her and family preference- CSW made referral.  PASRR and [employer] authorization pending, pt will admit to [facility] at [place].  Employment status:  Disabled (Comment on whether or not currently receiving Disability) Insurance information:  Managed Medicare([employer] Medicare) PT Recommendations:  [place] / Referral to community resources:  [place]  Patient/Family's Response to care:   appreciative  Patient/Family's Understanding of and Emotional Response to Diagnosis, Current Treatment, and Prognosis:  See above, pt shows good understanding of treatment and planning process, asked pertinent questions and was very engaged and clear on her goals (see above)  Emotional Assessment Appearance:  Appears stated age Attitude/Demeanor/Rapport:  Engaged Affect (typically observed):  Pleasant Orientation:  Oriented to Self, Oriented to Place, Oriented to  Time, Oriented to Situation Alcohol / Substance use:  Not Applicable Psych involvement (Current and /or in the community):  No (Comment)  Discharge Needs  Concerns to be addressed:  Discharge Planning Concerns Readmission within the last 30 days:  No Current discharge risk:  None Barriers to Discharge:  [employer], [place] ([place])   Nila Nephew, [place] 08/13/2018, 11:57 AM [phone]

## 2018-08-13 NOTE — Care Management Important Message (Signed)
Important Message  Patient Details  Name: NELSY MADONNA MRN: [id number] Date of Birth: Dec 12, 1955   Medicare Important Message Given:  Yes    Kerin Salen 08/13/2018, 12:19 [place] Message  Patient Details  Name: HAISLEE CORSO MRN: [id number] Date of Birth: 1956/02/02   Medicare Important Message Given:  Yes    Kerin Salen 08/13/2018, 12:19 PM

## 2018-08-14 LAB — CBC
HCT: 37.3 % (ref 36.0–46.0)
Hemoglobin: 11.4 g/dL — ABNORMAL LOW (ref 12.0–15.0)
MCH: 30.3 pg (ref 26.0–34.0)
MCHC: 30.6 g/dL (ref 30.0–36.0)
MCV: 99.2 fL (ref 80.0–100.0)
PLATELETS: 425 10*3/uL — AB (ref 150–400)
RBC: 3.76 MIL/uL — ABNORMAL LOW (ref 3.87–5.11)
RDW: 15.8 % — ABNORMAL HIGH (ref 11.5–15.5)
WBC: 17.3 10*3/uL — ABNORMAL HIGH (ref 4.0–10.5)
nRBC: 0 % (ref 0.0–0.2)

## 2018-08-14 LAB — BASIC METABOLIC PANEL
Anion gap: 8 (ref 5–15)
BUN: 14 mg/dL (ref 8–23)
CO2: 30 mmol/L (ref 22–32)
Calcium: 7.9 mg/dL — ABNORMAL LOW (ref 8.9–10.3)
Chloride: 99 mmol/L (ref 98–111)
Creatinine, Ser: 0.63 mg/dL (ref 0.44–1.00)
GFR calc Af Amer: >60 mL/min (ref >60)
GFR calc non Af Amer: >60 mL/min (ref >60)
Glucose, Bld: 118 mg/dL — ABNORMAL HIGH (ref 70–99)
Potassium: 2.6 mmol/L — CL (ref 3.5–5.1)
Sodium: 137 mmol/L (ref 135–145)

## 2018-08-14 MED ORDER — POTASSIUM CHLORIDE CRYS ER 20 MEQ PO TBCR
40.0000 meq | EXTENDED_RELEASE_TABLET | Freq: Two times a day (BID) | ORAL | Status: DC
  Administered 2018-08-14 – 2018-08-20 (×12): 40 meq via ORAL
  Filled 2018-08-14 (×12): qty 2

## 2018-08-14 MED ORDER — SODIUM CHLORIDE 0.9 % IV SOLN
INTRAVENOUS | Status: DC | PRN
  Administered 2018-08-14: 250 mL via INTRAVENOUS

## 2018-08-14 MED ORDER — WITCH HAZEL-GLYCERIN EX PADS
MEDICATED_PAD | CUTANEOUS | Status: DC | PRN
  Administered 2018-08-17: 22:00:00 via TOPICAL
  Filled 2018-08-14 (×2): qty 100

## 2018-08-14 MED ORDER — POTASSIUM CHLORIDE 10 MEQ/100ML IV SOLN
10.0000 meq | INTRAVENOUS | Status: AC
  Administered 2018-08-14 (×4): 10 meq via INTRAVENOUS
  Filled 2018-08-14 (×4): qty 100

## 2018-08-14 MED ORDER — MAGNESIUM OXIDE 400 (241.3 MG) MG PO TABS
400.0000 mg | ORAL_TABLET | Freq: Two times a day (BID) | ORAL | Status: DC
  Administered 2018-08-14 – 2018-08-20 (×12): 400 mg via ORAL
  Filled 2018-08-14 (×12): qty 1

## 2018-08-14 NOTE — Progress Notes (Signed)
CRITICAL VALUE ALERT  Critical Value:  Potassium 2.6  Date & Time Notied:  08/14/18 0955  Provider Notified: L.Pokhrel, MD  Orders Received/Actions taken: Potassium PO and IV x4 runs.

## 2018-08-14 NOTE — Progress Notes (Signed)
Received approved pasrr # [id number] E expires 09/13/18. Still awaiting [facility] Medicare approval for SNF admission [employer]).  Sharren Bridge, MSW, LCSW Clinical Social Work 08/14/2018 [phone]

## 2018-08-14 NOTE — Progress Notes (Signed)
PROGRESS NOTE  [name] White UQJ:[id number] DOB: 1956/06/03 DOA: 08/07/2018 PCP: [name], MD   LOS: 7 days   Brief narrative:  [name] White is a 63 y.o. female with medical history significant of COPD, depression, GERD, hypertension, history of pulmonary embolism, atrial fibrillation and hypothyroidism presented to the hospital with shortness of breath and cough for 1 week, failed outpatient treatment with antibiotics.  Patient was seen in the ER evaluated and was found to have marked expiratory wheezing.  She was given CPAP which seemed to help.  Despite 10 mg of albuterol 0.5 mg of Atrovent 125 mg Solu-Medrol and 2 g of magnesium she continued to wheeze and remained hypoxic with respiratory distress so she was being admitted to the hospital for COPD exacerbation.  Chest x-ray showed clearing of the previously seen bibasilar infiltrates.  Patient was continued on antibiotics, oxygen nebulizer, inhaled steroids and IV steroids with gradual improvement in her breathing status.  She however had dyspnea on exertion, debility and deconditioning and physical therapy was consulted who recommended short-term skilled nursing facility placement.  Assessment/Plan:  Principal Problem:   COPD with acute exacerbation (HCC) Active Problems:   Morbid obesity (HCC)   HYPERTENSION, BENIGN SYSTEMIC   GASTROESOPHAGEAL REFLUX, NO ESOPHAGITIS   Atrial flutter (HCC)   Hypokalemia   Chronic respiratory failure with hypoxia (HCC)   Leucocytosis   Acute on chronic respiratory failure secondary to acute COPD exacerbation:Recent pneumonia.  Improving wheezes and dyspnea.  Patient uses 2 litres of oxygen at baseline.  Continue oxygen, nebulizer, inhaled steroid.  Changed IV steroid to oral steroid. Continue Protonix and Flonase patient had wheezing and likely had postnasal drip/GERD.Marland Kitchen  Completed course of Rocephin and Zithromax.    Severe hypokalemia today.  Will start on p.o. potassium twice a day  and give her IV KCl rounds today.  Check magnesium.  Add magnesium oxide orally.  Adjust potassium supplements accordingly.  Hypertension:  on Cardizem at home, metoprolol was added in the hospital for better blood pressure control.  Patient does have allergy to hydralazine and lisinopril.  Need to watch closely for increasing bronchospasm  History of A. fib and a flutter: Continue on Cardizem, flecainide.  Continue Xarelto for anticoagulation.  No acute issues.  Beta-blocker was added this admission.  Watch closely for bradycardia  Hypokalemia: Resolved.  Leukocytosis:. Probably residual from recent pneumonia  and the use of IV steroids.  Will monitor.  Check CBC in a.m.  GERD Protonix  Debility, deconditioning.  Awaiting for skilled nursing facility placement.  Continue physical therapy.  Awaiting for insurance authorization.  VTE Prophylaxis: Xarelto  Code Status: Full code  Family Communication: No one at bedside  Disposition Plan: Skilled nursing facility for short-term rehab, social workers been consulted. Replenish potassium today.  Might need oral potassium on discharge.  Consultants:  None  Procedures:  None  Antibiotics: Anti-infectives (From admission, onward)   Start     Dose/Rate Route Frequency Ordered Stop   08/11/18 2200  azithromycin (ZITHROMAX) tablet 500 mg  Status:  Discontinued     500 mg Oral Daily at bedtime 08/11/18 1058 08/13/18 0939   08/08/18 0215  cefTRIAXone (ROCEPHIN) 1 g in sodium chloride 0.9 % 100 mL IVPB  Status:  Discontinued     1 g 200 mL/hr over 30 Minutes Intravenous Daily at bedtime 08/08/18 0206 08/13/18 0939   08/08/18 0215  azithromycin (ZITHROMAX) 500 mg in sodium chloride 0.9 % 250 mL IVPB  Status:  Discontinued  500 mg 250 mL/hr over 60 Minutes Intravenous Daily at bedtime 08/08/18 0206 08/11/18 1058      Subjective: Patient had mild shortness of breath and wheezing yesterday but feels better now.  Denies any chest  pain nausea vomiting.  Objective: Vitals:   08/14/18 0455 08/14/18 0930  BP: (!) 144/90   Pulse: 65   Resp: 17   Temp: 98.5 F (36.9 C)   SpO2: 98% 99%    Intake/Output Summary (Last 24 hours) at 08/14/2018 1359 Last data filed at 08/14/2018 0900 Gross per 24 hour  Intake 840 ml  Output -  Net 840 ml   Filed Weights   08/08/18 0224  Weight: (!) 137.7 kg   Body mass index is 50.52 kg/m.   Physical Exam: General: Morbidly obese.  On nasal cannula. HENT: Normocephalic, pupils equally reacting to light and accommodation.  No scleral pallor or icterus noted. Oral mucosa is moist.  Chest: Diminished breath sounds bilaterally.  Mild expiratory rhonchi. CVS: S1 &S2 heard. No murmur.  Regular rate and rhythm. Abdomen: Soft, nontender, nondistended.  Bowel sounds are heard.  Liver is not palpable, no abdominal mass palpated Extremities: No cyanosis, clubbing or edema.  Peripheral pulses are palpable. Psych: Alert, awake and oriented, normal mood CNS:  No cranial nerve deficits.  Power equal in all extremities.  No sensory deficits noted.  No cerebellar signs.   Skin: Warm and dry.  No rashes noted.  Data Review: I have personally reviewed the following laboratory data and studies,  CBC: Recent Labs  Lab 08/07/18 2130 08/08/18 0435 08/09/18 0423 08/10/18 0415 08/12/18 0418 08/14/18 0911  WBC 11.3* 10.6* 21.4* 19.0* 13.9* 17.3*  NEUTROABS 8.4*  --   --   --   --   --   HGB 12.2 11.5* 11.1* 10.9* 10.3* 11.4*  HCT 39.6 38.5 36.6 36.1 33.8* 37.3  MCV 96.1 98.5 98.4 100.0 97.4 99.2  PLT 443* 417* 462* 414* 354 607*   Basic Metabolic Panel: Recent Labs  Lab 08/08/18 0435 08/09/18 0423 08/10/18 0415 08/12/18 0418 08/14/18 0911  NA 140 140 138 140 137  K 3.6 3.8 4.1 3.5 2.6*  CL 105 107 106 106 99  CO2 19* 23 24 28 30   GLUCOSE 276* 145* 142* 111* 118*  BUN 12 16 19 16 14   CREATININE 0.70 0.58 0.66 0.59 0.63  CALCIUM 8.4* 8.6* 8.5* 7.6* 7.9*   Liver Function  Tests: Recent Labs  Lab 08/08/18 0435  AST 29  ALT 16  ALKPHOS 78  BILITOT 0.4  PROT 7.3  ALBUMIN 3.3*   No results for input(s): LIPASE, AMYLASE in the last 168 hours. No results for input(s): AMMONIA in the last 168 hours. Cardiac Enzymes: No results for input(s): CKTOTAL, CKMB, CKMBINDEX, TROPONINI in the last 168 hours. BNP (last 3 results) Recent Labs    07/01/18 0836  BNP 22.4    ProBNP (last 3 results) No results for input(s): PROBNP in the last 8760 hours.  CBG: No results for input(s): GLUCAP in the last 168 hours. No results found for this or any previous visit (from the past 240 hour(s)).   Studies: No results found.  Scheduled Meds: . albuterol  2.5 mg Nebulization TID  . budesonide (PULMICORT) nebulizer solution  0.25 mg Nebulization BID  . cycloSPORINE  1 drop Both Eyes BID  . diclofenac sodium  2 g Topical QID  . diltiazem  120 mg Oral Daily  . flecainide  100 mg Oral BID  . fluticasone  1 spray Each Nare Daily  . ketotifen  1 drop Both Eyes BID  . loratadine  10 mg Oral Daily  . metoprolol tartrate  25 mg Oral BID  . pantoprazole  40 mg Oral BID  . potassium chloride  40 mEq Oral BID  . predniSONE  40 mg Oral Q breakfast  . rivaroxaban  20 mg Oral QPC supper  . venlafaxine XR  150 mg Oral Q breakfast    Continuous Infusions: . sodium chloride 250 mL (08/14/18 1027)  . potassium chloride 10 mEq (08/14/18 1321)     Flora Lipps, MD  [facility] 08/14/2018

## 2018-08-15 LAB — BASIC METABOLIC PANEL
ANION GAP: 7 (ref 5–15)
BUN: 16 mg/dL (ref 8–23)
CO2: 29 mmol/L (ref 22–32)
Calcium: 8.2 mg/dL — ABNORMAL LOW (ref 8.9–10.3)
Chloride: 102 mmol/L (ref 98–111)
Creatinine, Ser: 0.59 mg/dL (ref 0.44–1.00)
GFR calc Af Amer: >60 mL/min (ref >60)
GFR calc non Af Amer: >60 mL/min (ref >60)
Glucose, Bld: 95 mg/dL (ref 70–99)
Potassium: 3.7 mmol/L (ref 3.5–5.1)
Sodium: 138 mmol/L (ref 135–145)

## 2018-08-15 LAB — CBC
HCT: 35.8 % — ABNORMAL LOW (ref 36.0–46.0)
Hemoglobin: 11 g/dL — ABNORMAL LOW (ref 12.0–15.0)
MCH: 30.3 pg (ref 26.0–34.0)
MCHC: 30.7 g/dL (ref 30.0–36.0)
MCV: 98.6 fL (ref 80.0–100.0)
PLATELETS: 418 10*3/uL — AB (ref 150–400)
RBC: 3.63 MIL/uL — ABNORMAL LOW (ref 3.87–5.11)
RDW: 15.8 % — ABNORMAL HIGH (ref 11.5–15.5)
WBC: 17.8 10*3/uL — ABNORMAL HIGH (ref 4.0–10.5)
nRBC: 0 % (ref 0.0–0.2)

## 2018-08-15 LAB — MAGNESIUM: Magnesium: 2.1 mg/dL (ref 1.7–2.4)

## 2018-08-15 MED ORDER — PREDNISONE 5 MG PO TABS: 5.0000 mg | ORAL_TABLET | Freq: Every day | ORAL | Status: DC

## 2018-08-15 MED ORDER — PREDNISONE 10 MG PO TABS
10.0000 mg | ORAL_TABLET | Freq: Every day | ORAL | Status: DC
  Administered 2018-08-20: 10 mg via ORAL
  Filled 2018-08-15: qty 1

## 2018-08-15 MED ORDER — PREDNISONE 20 MG PO TABS
30.0000 mg | ORAL_TABLET | Freq: Every day | ORAL | Status: AC
  Administered 2018-08-16 – 2018-08-17 (×2): 30 mg via ORAL
  Filled 2018-08-15 (×2): qty 1

## 2018-08-15 MED ORDER — PREDNISONE 20 MG PO TABS
20.0000 mg | ORAL_TABLET | Freq: Every day | ORAL | Status: AC
  Administered 2018-08-18 – 2018-08-19 (×2): 20 mg via ORAL
  Filled 2018-08-15 (×2): qty 1

## 2018-08-15 NOTE — Progress Notes (Addendum)
PROGRESS NOTE    [name] White  ZHG:[id number] DOB: Mar 24, 1956 DOA: 08/07/2018 PCP: [name], MD    Brief Narrative: [name] White a 63 y.o.femalewith medical history significant ofCOPD, depression, GERD, hypertension, history of pulmonary embolism, atrial fibrillation and hypothyroidism presented to the hospital with shortness of breath and cough for 1 week, failed outpatient treatment with antibiotics.  Patient was seen in the ER evaluated and was found to have marked expiratory wheezing.   In the ED, she was started on CPAP which seemed to help. Despite 10 mg of albuterol 0.5 mg of Atrovent 125 mg Solu-Medrol and 2 g of magnesium she continued to wheeze and remained hypoxic with respiratory distress so she was being admitted to the hospital for COPD exacerbation. Chest x-ray showed clearing of the previously seen bibasilar infiltrates.  Patient was continued on antibiotics, oxygen nebulizer, inhaled steroids and IV steroids with gradual improvement in her breathing status.  She however had dyspnea on exertion, debility and deconditioning and physical therapy was consulted who recommended short-term skilled nursing facility placement.   Assessment & Plan:    Principal Problem:   COPD with acute exacerbation ([place]) Active Problems:   Morbid obesity ([place])   HYPERTENSION, BENIGN SYSTEMIC   GASTROESOPHAGEAL REFLUX, NO ESOPHAGITIS   Atrial flutter (HCC)   Hypokalemia   Chronic respiratory failure with hypoxia (HCC)   Leucocytosis   Acute on chronic respiratory failure secondary to acute COPD exacerbation: Patient presented to [facility] ED with progressive shortness of breath in the setting of recent pneumonia.  Patient utilizes 2 L nasal cannula at baseline.  Initially required CPAP to maintain oxygenation on admission.  She was started on scheduled nebs, inhaled and IV steroids.  Further she completed a course of ceftriaxone and Zithromax for her  pneumonia. --Continue supplemental oxygen, currently at home dose 2 L per nasal cannula --Continue prednisone taper --Continue Pulmicort nebs twice daily while inpatient; plan to resume home Symbicort on discharge --Duo nebs as needed --Supportive care   Severe hypokalemia  Potassium was noted to be 2.6.  She was given oral and IV replacement.  Magnesium level was normal at 2.1. --Potassium level now normalized, 3.7 today --We will continue magnesium 400 mg p.o. twice daily and potassium chloride 40 mg p.o. twice daily. --Continue to monitor daily BMP and magnesium level  Hypertension Patient on Cardizem 120 mg CD daily at home.  Patient was started on metoprolol tartrate 25 mg p.o. twice daily for better blood pressure control.  Patient does have allergy to hydralazine and lisinopril.  Tolerating well, continue to watch for bronchospasm with beta blockade.  History of A. fib and a flutter Continue on Cardizem, flecainide. Continue Xarelto for anticoagulation.  No acute issues.  Beta-blocker was added this admission.  Watch closely for bradycardia  Leukocytosis Probably residual from recent pneumonia and the use of IV steroids.  Will monitor.  Check CBC in a.m.  GERD: Continue Protonix  Debility, deconditioning.  Awaiting for skilled nursing facility placement.  Continue physical therapy.  Awaiting for insurance authorization.   DVT prophylaxis: Xarelto Code Status: Full code Family Communication: None Disposition Plan: Medically ready for discharge to SNF, awaiting insurance authorization   Consultants:   None  Procedures:   Left PICC 08/11/2018  Antimicrobials:   Zithromax 2/15-2/17   Subjective: Patient seen and examined this morning, resting comfortably in bed.  Breathing continues to improve daily.  Now back to her normal baseline oxygen requirement of 2 L per nasal cannula.  Continues to work with physical therapy, awaiting insurance authorization for SNF  placement.  Continues with global weakness/debility.  No other complaints this morning.  Denies fever/chills/night sweats, no chest pain, no palpitations, no abdominal pain, no fever/chills/night sweats, no cough/congestion, no nausea/vomiting/diarrhea.  No acute events overnight per nursing staff.  Objective: Vitals:   08/15/18 0447 08/15/18 0756 08/15/18 1358 08/15/18 1444  BP: 126/74   119/68  Pulse: 62   68  Resp: 18   18  Temp: 98.3 F (36.8 C)   98.4 F (36.9 C)  TempSrc: Oral   Oral  SpO2: 99% 98% 98% 97%  Weight:      Height:        Intake/Output Summary (Last 24 hours) at 08/15/2018 1453 Last data filed at 08/15/2018 1444 Gross per 24 hour  Intake 1500 ml  Output -  Net 1500 ml   Filed Weights   08/08/18 0224  Weight: (!) 137.7 kg    Examination:  General exam: Appears calm and comfortable  Respiratory system: Clear to auscultation. Respiratory effort normal.  On 2 L nasal cannula Cardiovascular system: S1 & S2 heard, RRR. No JVD, murmurs, rubs, gallops or clicks. No pedal edema. Gastrointestinal system: Abdomen is nondistended, soft and nontender. No organomegaly or masses felt. Normal bowel sounds heard. Central nervous system: Alert and oriented. No focal neurological deficits. Extremities: Symmetric 5 x 5 power. Skin: No rashes, lesions or ulcers Psychiatry: Judgement and insight appear normal. Mood & affect appropriate.     Data Reviewed: I have personally reviewed following labs and imaging studies  CBC: Recent Labs  Lab 08/09/18 0423 08/10/18 0415 08/12/18 0418 08/14/18 0911 08/15/18 0422  WBC 21.4* 19.0* 13.9* 17.3* 17.8*  HGB 11.1* 10.9* 10.3* 11.4* 11.0*  HCT 36.6 36.1 33.8* 37.3 35.8*  MCV 98.4 100.0 97.4 99.2 98.6  PLT 462* 414* 354 425* 440*   Basic Metabolic Panel: Recent Labs  Lab 08/09/18 0423 08/10/18 0415 08/12/18 0418 08/14/18 0911 08/15/18 0422  NA 140 138 140 137 138  K 3.8 4.1 3.5 2.6* 3.7  CL 107 106 106 99 102  CO2 23  24 28 30 29   GLUCOSE 145* 142* 111* 118* 95  BUN 16 19 16 14 16   CREATININE 0.58 0.66 0.59 0.63 0.59  CALCIUM 8.6* 8.5* 7.6* 7.9* 8.2*  MG  --   --   --   --  2.1   GFR: Estimated Creatinine Clearance: 101.5 mL/min (by C-G formula based on SCr of 0.59 mg/dL). Liver Function Tests: No results for input(s): AST, ALT, ALKPHOS, BILITOT, PROT, ALBUMIN in the last 168 hours. No results for input(s): LIPASE, AMYLASE in the last 168 hours. No results for input(s): AMMONIA in the last 168 hours. Coagulation Profile: No results for input(s): INR, PROTIME in the last 168 hours. Cardiac Enzymes: No results for input(s): CKTOTAL, CKMB, CKMBINDEX, TROPONINI in the last 168 hours. BNP (last 3 results) No results for input(s): PROBNP in the last 8760 hours. HbA1C: No results for input(s): HGBA1C in the last 72 hours. CBG: No results for input(s): GLUCAP in the last 168 hours. Lipid Profile: No results for input(s): CHOL, HDL, LDLCALC, TRIG, CHOLHDL, LDLDIRECT in the last 72 hours. Thyroid Function Tests: No results for input(s): TSH, T4TOTAL, FREET4, T3FREE, THYROIDAB in the last 72 hours. Anemia Panel: No results for input(s): VITAMINB12, FOLATE, FERRITIN, TIBC, IRON, RETICCTPCT in the last 72 hours. Sepsis Labs: No results for input(s): PROCALCITON, LATICACIDVEN in the last 168 hours.  No results found  for this or any previous visit (from the past 240 hour(s)).       Radiology Studies: No results found.      Scheduled Meds: . albuterol  2.5 mg Nebulization TID  . budesonide (PULMICORT) nebulizer solution  0.25 mg Nebulization BID  . cycloSPORINE  1 drop Both Eyes BID  . diclofenac sodium  2 g Topical QID  . diltiazem  120 mg Oral Daily  . flecainide  100 mg Oral BID  . fluticasone  1 spray Each Nare Daily  . ketotifen  1 drop Both Eyes BID  . loratadine  10 mg Oral Daily  . magnesium oxide  400 mg Oral BID  . metoprolol tartrate  25 mg Oral BID  . pantoprazole  40 mg Oral  BID  . potassium chloride  40 mEq Oral BID  . [START ON 08/16/2018] predniSONE  30 mg Oral Q breakfast   Followed by  . [START ON 08/18/2018] predniSONE  20 mg Oral Q breakfast   Followed by  . [START ON 08/20/2018] predniSONE  10 mg Oral Q breakfast   Followed by  . [START ON 08/22/2018] predniSONE  5 mg Oral Q breakfast  . rivaroxaban  20 mg Oral QPC supper  . venlafaxine XR  150 mg Oral Q breakfast   Continuous Infusions: . sodium chloride 250 mL (08/14/18 1027)     LOS: 8 days    Time spent: 26 minutes    Taesha Goodell J [place] MD [facility] Pager [phone]  If 7PM-7AM, please contact night-coverage [email or web address] Password [facility] 08/15/2018, 2:53 PM

## 2018-08-15 NOTE — Progress Notes (Signed)
Pt's insurance advised facility [facility]) that either more information needed to justify SNF authorization or peer-to-peer will be needed to proceed. CSW left voicemail for [facility] at [place] ex [phone] to clarify information needed in order to proceed.  Sharren Bridge, MSW, LCSW Clinical Social Work 08/15/2018 [phone]

## 2018-08-15 NOTE — Progress Notes (Signed)
Physical Therapy Treatment Patient Details Name: [name] White MRN: [id number] DOB: 06/14/1956 Today's Date: 08/15/2018    History of Present Illness 63 y.o. female with medical history significant of COPD, depression, GERD, hypertension, pulmonary embolism, atrial fibrillation and hypothyroidism and admitted for Acute on chronic respiratory failure secondary to acute COPD exacerbation    PT Comments    Assisted OOB to amb a very limited distance.  General Gait Details: verbal cues for safety with SPC, distance to tolerance, pt required standing rest break, remained on 2L O2 [place] and Spo2 92% nad dyspnea 3/4.  Very limited distance and unstaedy gait with c/o L knee pain from recent fall.  Pt stated she lives with her sister but she still works. Pt will need ST Rehab at SNF prior to D/C to home to regain prior level of mobility.    Follow Up Recommendations  SNF     Equipment Recommendations  None recommended by PT    Recommendations for Other Services       Precautions / Restrictions Precautions Precautions: Fall Precaution Comments: chronic 2L O2 Restrictions Weight Bearing Restrictions: No    Mobility  Bed Mobility Overal bed mobility: Needs Assistance Bed Mobility: Supine to Sit;Sit to Supine     Supine to sit: Supervision;HOB elevated Sit to supine: Supervision;HOB elevated   General bed mobility comments: required increased time and effort   Transfers Overall transfer level: Needs assistance Equipment used: Straight cane Transfers: Sit to/from [employer] Transfers Sit to Stand: Min guard;Min assist Stand pivot transfers: Min guard;Min assist       General transfer comment: verbal cues for safe technique and unsteady   Ambulation/Gait Ambulation/Gait assistance: Min assist Gait Distance (Feet): 32 Feet Assistive device: Straight cane Gait Pattern/deviations: Step-to pattern;Antalgic;Decreased stance time - left Gait velocity: decreased    General  Gait Details: verbal cues for safety with SPC, distance to tolerance, pt required standing rest break, remained on 2L O2 [place] and Spo2 92% nad dyspnea 3/4.  Very limited distance and unstaedy gait with c/o L knee pain   Stairs             Wheelchair Mobility    Modified Rankin (Stroke Patients Only)       Balance                                            Cognition Arousal/Alertness: Awake/alert Behavior During Therapy: WFL for tasks assessed/performed Overall Cognitive Status: Within Functional Limits for tasks assessed                                        Exercises      General Comments        Pertinent Vitals/Pain Pain Assessment: Faces Faces Pain Scale: Hurts a little bit Pain Location: L knee and L buttock from recent fall Pain Descriptors / Indicators: Discomfort Pain Intervention(s): Monitored during session    Home Living                      Prior Function            PT Goals (current goals can now be found in the care plan section) Progress towards PT goals: Progressing toward goals    Frequency    Min  2X/week      PT Plan Current plan remains appropriate    Co-evaluation              AM-PAC PT "6 Clicks" Mobility   Outcome Measure  Help needed turning from your back to your side while in a flat bed without using bedrails?: A Little Help needed moving from lying on your back to sitting on the side of a flat bed without using bedrails?: A Little Help needed moving to and from a bed to a chair (including a wheelchair)?: A Little Help needed standing up from a chair using your arms (e.g., wheelchair or bedside chair)?: A Little Help needed to walk in hospital room?: A Little Help needed climbing 3-5 steps with a railing? : A Lot 6 Click Score: 17    End of Session Equipment Utilized During Treatment: Gait belt;Oxygen Activity Tolerance: Patient limited by fatigue Patient left: in  bed;with call bell/phone within reach Nurse Communication: Mobility status PT Visit Diagnosis: Difficulty in walking, not elsewhere classified (R26.2)     Time: 6010-9323 PT Time Calculation (min) (ACUTE ONLY): 25 min  Charges:  $Gait Training: 8-22 mins $Therapeutic Activity: 8-22 mins                     Rica Koyanagi  PTA Acute  Rehabilitation Services Pager      [phone] Office      [phone]

## 2018-08-16 LAB — MAGNESIUM: MAGNESIUM: 2.1 mg/dL (ref 1.7–2.4)

## 2018-08-16 LAB — CBC
HCT: 36.5 % (ref 36.0–46.0)
Hemoglobin: 11.1 g/dL — ABNORMAL LOW (ref 12.0–15.0)
MCH: 30.1 pg (ref 26.0–34.0)
MCHC: 30.4 g/dL (ref 30.0–36.0)
MCV: 98.9 fL (ref 80.0–100.0)
PLATELETS: 420 10*3/uL — AB (ref 150–400)
RBC: 3.69 MIL/uL — ABNORMAL LOW (ref 3.87–5.11)
RDW: 15.9 % — ABNORMAL HIGH (ref 11.5–15.5)
WBC: 16.5 10*3/uL — ABNORMAL HIGH (ref 4.0–10.5)
nRBC: 0 % (ref 0.0–0.2)

## 2018-08-16 LAB — BASIC METABOLIC PANEL
Anion gap: 6 (ref 5–15)
BUN: 15 mg/dL (ref 8–23)
CO2: 30 mmol/L (ref 22–32)
Calcium: 8.2 mg/dL — ABNORMAL LOW (ref 8.9–10.3)
Chloride: 102 mmol/L (ref 98–111)
Creatinine, Ser: 0.54 mg/dL (ref 0.44–1.00)
GFR calc Af Amer: >60 mL/min (ref >60)
GLUCOSE: 105 mg/dL — AB (ref 70–99)
Potassium: 3.8 mmol/L (ref 3.5–5.1)
Sodium: 138 mmol/L (ref 135–145)

## 2018-08-16 MED ORDER — HYDROCORTISONE ACETATE 25 MG RE SUPP
25.0000 mg | Freq: Two times a day (BID) | RECTAL | Status: DC
  Administered 2018-08-17 – 2018-08-19 (×6): 25 mg via RECTAL
  Filled 2018-08-16 (×10): qty 1

## 2018-08-16 MED ORDER — HYDROCORTISONE 1 % EX CREA
TOPICAL_CREAM | Freq: Two times a day (BID) | CUTANEOUS | Status: DC
  Filled 2018-08-16: qty 28

## 2018-08-16 NOTE — Progress Notes (Signed)
Spoke with Maudie Mercury at [facility], provided most recent therapy notes for authorization request.  Sharren Bridge, MSW, LCSW Clinical Social Work 08/16/2018 [phone]

## 2018-08-16 NOTE — Progress Notes (Signed)
PROGRESS NOTE    [name] White  ZCH:[id number] DOB: August 14, 1955 DOA: 08/07/2018 PCP: [name], MD    Brief Narrative: [name] White a 63 y.o.femalewith medical history significant ofCOPD, depression, GERD, hypertension, history of pulmonary embolism, atrial fibrillation and hypothyroidism presented to the hospital with shortness of breath and cough for 1 week, failed outpatient treatment with antibiotics.  Patient was seen in the ER evaluated and was found to have marked expiratory wheezing.   In the ED, she was started on CPAP which seemed to help. Despite 10 mg of albuterol 0.5 mg of Atrovent 125 mg Solu-Medrol and 2 g of magnesium she continued to wheeze and remained hypoxic with respiratory distress so she was being admitted to the hospital for COPD exacerbation. Chest x-ray showed clearing of the previously seen bibasilar infiltrates.  Patient was continued on antibiotics, oxygen nebulizer, inhaled steroids and IV steroids with gradual improvement in her breathing status.  She however had dyspnea on exertion, debility and deconditioning and physical therapy was consulted who recommended short-term skilled nursing facility placement.   Assessment & Plan:    Principal Problem:   COPD with acute exacerbation ([place]) Active Problems:   Morbid obesity ([place])   HYPERTENSION, BENIGN SYSTEMIC   GASTROESOPHAGEAL REFLUX, NO ESOPHAGITIS   Atrial flutter ([facility])   Hypokalemia   Chronic respiratory failure with hypoxia ([facility])   Leucocytosis   Acute on chronic respiratory failure secondary to acute COPD exacerbation: Patient presented to [facility] long ED with progressive shortness of breath in the setting of recent pneumonia.  Patient utilizes 2 L nasal cannula at baseline.  Initially required CPAP to maintain oxygenation on admission.  She was started on scheduled nebs, inhaled and IV steroids.  Further she completed a course of ceftriaxone and Zithromax for her  pneumonia. --Continue supplemental oxygen, currently at home dose 2 L per nasal cannula --Continue prednisone taper --Continue Pulmicort nebs twice daily while inpatient; plan to resume home Symbicort on discharge --Duo nebs as needed --Supportive care   Severe hypokalemia  Potassium was noted to be 2.6.  She was given oral and IV replacement.  Magnesium level was normal at 2.1. --Potassium level now normalized, 3.8 today --We will continue magnesium 400 mg p.o. twice daily and potassium chloride 40 mg p.o. twice daily. --Continue to monitor daily BMP and magnesium level  Hypertension Patient on Cardizem 120 mg CD daily at home.  Patient was started on metoprolol tartrate 25 mg p.o. twice daily for better blood pressure control.  Patient does have allergy to hydralazine and lisinopril.  Tolerating well, continue to watch for bronchospasm with beta blockade.  History of A. fib and a flutter Continue on Cardizem, flecainide. Continue Xarelto for anticoagulation.  No acute issues.  Beta-blocker was added this admission.  Watch closely for bradycardia  Leukocytosis Probably residual from recent pneumonia and the use of IV steroids.  Will monitor.  Check CBC in a.m.  GERD: Continue Protonix  Debility, deconditioning.  Awaiting for skilled nursing facility placement.  Continue physical therapy.  Awaiting for insurance authorization.  PT evaluation performed. SNF recommended. SNF appropriate as the patient has > 3 days of hospital care and is felt to need rehabservices to restore this patient to their prior level of function to achieve safe transition back to home care. This patient needs rehab services for at least 5 days per week and skilled nursing services daily to facilitate this transition.  Rehab is being requested as the most appropriate d/c option for this patient  and is NOT felt to be for custodial care as evidenced by independent living prior to hospitalization.     DVT  prophylaxis: Xarelto Code Status: Full code Family Communication: None Disposition Plan: Medically ready for discharge to SNF, awaiting insurance authorization   Consultants:   None  Procedures:   Left PICC 08/11/2018  Antimicrobials:   Zithromax 2/15-2/17   Subjective: Patient seen and examined this morning, resting comfortably in bed.  Breathing continues to improve daily.  Now back to her normal baseline oxygen requirement of 2 L per nasal cannula.  Continues to work with physical therapy, awaiting insurance authorization for SNF placement, new documents sent by CM today.  Continues with global weakness/debility.  No other complaints this morning.  Denies fever/chills/night sweats, no chest pain, no palpitations, no abdominal pain, no fever/chills/night sweats, no cough/congestion, no nausea/vomiting/diarrhea.  No acute events overnight per nursing staff.  Objective: Vitals:   08/15/18 1913 08/15/18 2121 08/16/18 0537 08/16/18 0916  BP:  132/82 126/84   Pulse:  72 (!) 59   Resp:  18 18   Temp:  97.9 F (36.6 C) 98.2 F (36.8 C)   TempSrc:  Oral Oral   SpO2: 98% 99% 100% 98%  Weight:      Height:        Intake/Output Summary (Last 24 hours) at 08/16/2018 1416 Last data filed at 08/16/2018 4818 Gross per 24 hour  Intake 1250 ml  Output -  Net 1250 ml   Filed Weights   08/08/18 0224  Weight: (!) 137.7 kg    Examination:  General exam: Appears calm and comfortable  Respiratory system: Clear to auscultation. Respiratory effort normal.  On 2 L nasal cannula Cardiovascular system: S1 & S2 heard, RRR. No JVD, murmurs, rubs, gallops or clicks. No pedal edema. Gastrointestinal system: Abdomen is nondistended, soft and nontender. No organomegaly or masses felt. Normal bowel sounds heard. Central nervous system: Alert and oriented. No focal neurological deficits. Extremities: Symmetric 5 x 5 power. Skin: No rashes, lesions or ulcers Psychiatry: Judgement and insight  appear normal. Mood & affect appropriate.     Data Reviewed: I have personally reviewed following labs and imaging studies  CBC: Recent Labs  Lab 08/10/18 0415 08/12/18 0418 08/14/18 0911 08/15/18 0422 08/16/18 0441  WBC 19.0* 13.9* 17.3* 17.8* 16.5*  HGB 10.9* 10.3* 11.4* 11.0* 11.1*  HCT 36.1 33.8* 37.3 35.8* 36.5  MCV 100.0 97.4 99.2 98.6 98.9  PLT 414* 354 425* 418* 563*   Basic Metabolic Panel: Recent Labs  Lab 08/10/18 0415 08/12/18 0418 08/14/18 0911 08/15/18 0422 08/16/18 0441  NA 138 140 137 138 138  K 4.1 3.5 2.6* 3.7 3.8  CL 106 106 99 102 102  CO2 24 28 30 29 30   GLUCOSE 142* 111* 118* 95 105*  BUN 19 16 14 16 15   CREATININE 0.66 0.59 0.63 0.59 0.54  CALCIUM 8.5* 7.6* 7.9* 8.2* 8.2*  MG  --   --   --  2.1 2.1   GFR: Estimated Creatinine Clearance: 101.5 mL/min (by C-G formula based on SCr of 0.54 mg/dL). Liver Function Tests: No results for input(s): AST, ALT, ALKPHOS, BILITOT, PROT, ALBUMIN in the last 168 hours. No results for input(s): LIPASE, AMYLASE in the last 168 hours. No results for input(s): AMMONIA in the last 168 hours. Coagulation Profile: No results for input(s): INR, PROTIME in the last 168 hours. Cardiac Enzymes: No results for input(s): CKTOTAL, CKMB, CKMBINDEX, TROPONINI in the last 168 hours. BNP (last 3  results) No results for input(s): PROBNP in the last 8760 hours. HbA1C: No results for input(s): HGBA1C in the last 72 hours. CBG: No results for input(s): GLUCAP in the last 168 hours. Lipid Profile: No results for input(s): CHOL, HDL, LDLCALC, TRIG, CHOLHDL, LDLDIRECT in the last 72 hours. Thyroid Function Tests: No results for input(s): TSH, T4TOTAL, FREET4, T3FREE, THYROIDAB in the last 72 hours. Anemia Panel: No results for input(s): VITAMINB12, FOLATE, FERRITIN, TIBC, IRON, RETICCTPCT in the last 72 hours. Sepsis Labs: No results for input(s): PROCALCITON, LATICACIDVEN in the last 168 hours.  No results found for this  or any previous visit (from the past 240 hour(s)).       Radiology Studies: No results found.      Scheduled Meds: . albuterol  2.5 mg Nebulization TID  . budesonide (PULMICORT) nebulizer solution  0.25 mg Nebulization BID  . cycloSPORINE  1 drop Both Eyes BID  . diclofenac sodium  2 g Topical QID  . diltiazem  120 mg Oral Daily  . flecainide  100 mg Oral BID  . fluticasone  1 spray Each Nare Daily  . ketotifen  1 drop Both Eyes BID  . loratadine  10 mg Oral Daily  . magnesium oxide  400 mg Oral BID  . metoprolol tartrate  25 mg Oral BID  . pantoprazole  40 mg Oral BID  . potassium chloride  40 mEq Oral BID  . predniSONE  30 mg Oral Q breakfast   Followed by  . [START ON 08/18/2018] predniSONE  20 mg Oral Q breakfast   Followed by  . [START ON 08/20/2018] predniSONE  10 mg Oral Q breakfast   Followed by  . [START ON 08/22/2018] predniSONE  5 mg Oral Q breakfast  . rivaroxaban  20 mg Oral QPC supper  . venlafaxine XR  150 mg Oral Q breakfast   Continuous Infusions: . sodium chloride 250 mL (08/14/18 1027)     LOS: 9 days    Time spent: 27 minutes    Broedy Osbourne J [place] DO [facility] Pager [phone]  If 7PM-7AM, please contact night-coverage [email or web address] Password [email or web address] 08/16/2018, 2:16 PM

## 2018-08-16 NOTE — Care Management Important Message (Signed)
Important Message  Patient Details  Name: [name] White MRN: [id number] Date of Birth: 07-09-1955   Medicare Important Message Given:  Yes    Kerin Salen 08/16/2018, 12:50 [place] Message  Patient Details  Name: [name] White MRN: [id number] Date of Birth: [birth year]   Medicare Important Message Given:  Yes    Kerin Salen 08/16/2018, 12:41 PM

## 2018-08-17 MED ORDER — DICLOFENAC SODIUM 1 % TD GEL
2.0000 g | Freq: Four times a day (QID) | TRANSDERMAL | Status: DC | PRN
  Filled 2018-08-17: qty 100

## 2018-08-17 NOTE — Progress Notes (Signed)
Physical Therapy Treatment Patient Details Name: [name] White MRN: [id number] DOB: 1956/01/26 Today's Date: 08/17/2018    History of Present Illness 63 y.o. female with medical history significant of COPD, depression, GERD, hypertension, pulmonary embolism, atrial fibrillation and hypothyroidism and admitted for Acute on chronic respiratory failure secondary to acute COPD exacerbation    PT Comments    Pt assisted with ambulating in hallway and improved distance slightly however limited by elevated HR today (pt able to feel increased rate as well).  Pt anticipates d/c to SNF soon.  Follow Up Recommendations  SNF     Equipment Recommendations  None recommended by PT    Recommendations for Other Services       Precautions / Restrictions Precautions Precautions: Fall Precaution Comments: chronic 2L O2 Restrictions Weight Bearing Restrictions: No    Mobility  Bed Mobility Overal bed mobility: Needs Assistance Bed Mobility: Supine to Sit;Sit to Supine     Supine to sit: Supervision;HOB elevated Sit to supine: Supervision;HOB elevated   General bed mobility comments: required increased time and effort   Transfers Overall transfer level: Needs assistance Equipment used: Straight cane Transfers: Sit to/from Stand Sit to Stand: Min guard Stand pivot transfers: Min guard       General transfer comment: verbal cues for safe technique  Ambulation/Gait Ambulation/Gait assistance: Min guard;Min assist Gait Distance (Feet): 50 Feet(x2) Assistive device: Straight cane Gait Pattern/deviations: Step-to pattern;Antalgic;Decreased stance time - left Gait velocity: decreased    General Gait Details: verbal cues for safety with SPC, distance to tolerance, pt required standing rest break, remained on 2L O2 [place] and Spo2 95% and dyspnea 2/4.  HR elevated to 150s so standing rest break prompted and HR improved to 103, HR 112 bpm upon return to bed   Stairs              Wheelchair Mobility    Modified Rankin (Stroke Patients Only)       Balance Overall balance assessment: History of Falls                                          Cognition Arousal/Alertness: Awake/alert Behavior During Therapy: WFL for tasks assessed/performed Overall Cognitive Status: Within Functional Limits for tasks assessed                                        Exercises      General Comments        Pertinent Vitals/Pain Pain Assessment: No/denies pain Pain Intervention(s): Monitored during session;Repositioned    Home Living                      Prior Function            PT Goals (current goals can now be found in the care plan section) Progress towards PT goals: Progressing toward goals    Frequency    Min 2X/week      PT Plan Current plan remains appropriate    Co-evaluation              AM-PAC PT "6 Clicks" Mobility   Outcome Measure  Help needed turning from your back to your side while in a flat bed without using bedrails?: A Little Help needed moving from lying on your back  to sitting on the side of a flat bed without using bedrails?: A Little Help needed moving to and from a bed to a chair (including a wheelchair)?: A Little Help needed standing up from a chair using your arms (e.g., wheelchair or bedside chair)?: A Little Help needed to walk in hospital room?: A Little Help needed climbing 3-5 steps with a railing? : A Lot 6 Click Score: 17    End of Session Equipment Utilized During Treatment: Gait belt;Oxygen Activity Tolerance: Patient limited by fatigue Patient left: in bed;with call bell/phone within reach   PT Visit Diagnosis: Difficulty in walking, not elsewhere classified (R26.2)     Time: 1683-7290 PT Time Calculation (min) (ACUTE ONLY): 15 min  Charges:  $Gait Training: 8-22 mins                     Carmelia Bake, PT, DPT Acute Rehabilitation Services Office:  [phone] Pager: [phone]  Trena Platt 08/17/2018, 1:38 PM

## 2018-08-17 NOTE — Progress Notes (Signed)
PROGRESS NOTE    [name] White  WJX:[id number] DOB: [birth year] DOA: 08/07/2018 PCP: [name], MD    Brief Narrative: [name] White a 63 y.o.femalewith medical history significant ofCOPD, depression, GERD, hypertension, history of pulmonary embolism, atrial fibrillation and hypothyroidism presented to the hospital with shortness of breath and cough for 1 week, failed outpatient treatment with antibiotics.  Patient was seen in the ER evaluated and was found to have marked expiratory wheezing.   In the ED, she was started on CPAP which seemed to help. Despite 10 mg of albuterol 0.5 mg of Atrovent 125 mg Solu-Medrol and 2 g of magnesium she continued to wheeze and remained hypoxic with respiratory distress so she was being admitted to the hospital for COPD exacerbation. Chest x-ray showed clearing of the previously seen bibasilar infiltrates.  Patient was continued on antibiotics, oxygen nebulizer, inhaled steroids and IV steroids with gradual improvement in her breathing status.  She however had dyspnea on exertion, debility and deconditioning and physical therapy was consulted who recommended short-term skilled nursing facility placement.   Assessment & Plan:    Principal Problem:   COPD with acute exacerbation ([place]) Active Problems:   Morbid obesity ([place])   HYPERTENSION, BENIGN SYSTEMIC   GASTROESOPHAGEAL REFLUX, NO ESOPHAGITIS   Atrial flutter (H[facility])   Hypokalemia   Chronic respiratory failure with hypoxia ([facility])   Leucocytosis   Acute on chronic respiratory failure secondary to acute COPD exacerbation: Patient presented to [facility] long ED with progressive shortness of breath in the setting of recent pneumonia.  Patient utilizes 2 L nasal cannula at baseline.  Initially required CPAP to maintain oxygenation on admission.  She was started on scheduled nebs, inhaled and IV steroids.  Further she completed a course of ceftriaxone and Zithromax for her  pneumonia. --Continue supplemental oxygen, currently at home dose 2 L per nasal cannula --Continue prednisone taper --Continue Pulmicort nebs twice daily while inpatient; plan to resume home Symbicort on discharge --Duo nebs as needed --Supportive care   Severe hypokalemia  Potassium was noted to be 2.6.  She was given oral and IV replacement.  Magnesium level was normal at 2.1. --Potassium level now normalized, 3.8  --continue magnesium 400 mg p.o. twice daily and potassium chloride 40 mg p.o. twice daily. --Continue to monitor daily BMP and magnesium level  Hypertension Patient on Cardizem 120 mg CD daily at home.  Patient was started on metoprolol tartrate 25 mg p.o. twice daily for better blood pressure control.  Patient does have allergy to hydralazine and lisinopril.  Tolerating well, continue to watch for bronchospasm with beta blockade.  History of A. fib and a flutter Continue on Cardizem, flecainide. Continue Xarelto for anticoagulation.  No acute issues.  Beta-blocker was added this admission.  Watch closely for bradycardia  Leukocytosis Probably residual from recent pneumonia and the use of IV steroids.  Will monitor.  Check CBC in a.m.  GERD: Continue Protonix  Debility, deconditioning.  Awaiting for skilled nursing facility placement.  Continue physical therapy.  Awaiting for insurance authorization.  PT evaluation performed. SNF recommended. SNF appropriate as the patient has > 3 days of hospital care and is felt to need rehabservices to restore this patient to their prior level of function to achieve safe transition back to home care. This patient needs rehab services for at least 5 days per week and skilled nursing services daily to facilitate this transition.  Rehab is being requested as the most appropriate d/c option for this patient and is  NOT felt to be for custodial care as evidenced by independent living prior to hospitalization.    Informed by case  management today, that insurance has declined SNF authorization; patient plans to challenge concerns declination.   DVT prophylaxis: Xarelto Code Status: Full code Family Communication: None Disposition Plan: Medically ready for discharge to SNF, [employer] declined SNF authorization, patient plans to refute   Consultants:   None  Procedures:   Left PICC 08/11/2018  Antimicrobials:   Zithromax 2/15-2/17   Subjective: Patient seen and examined this morning, resting comfortably in bed.  Breathing continues to improve daily.  Now back to her normal baseline oxygen requirement of 2 L per nasal cannula.  Continues to work with physical therapy.  Continues with global weakness/debility.  No other complaints this morning.  Denies fever/chills/night sweats, no chest pain, no palpitations, no abdominal pain, no fever/chills/night sweats, no cough/congestion, no nausea/vomiting/diarrhea.  No acute events overnight per nursing staff.  Objective: Vitals:   08/16/18 2114 08/17/18 0519 08/17/18 0932 08/17/18 1344  BP:  (!) 130/92  122/72  Pulse:  63  66  Resp:  18  20  Temp:  (!) 97.5 F (36.4 C)  98 F (36.7 C)  TempSrc:  Oral  Oral  SpO2: 98% 98% 98% 100%  Weight:      Height:        Intake/Output Summary (Last 24 hours) at 08/17/2018 1407 Last data filed at 08/16/2018 1512 Gross per 24 hour  Intake 240 ml  Output -  Net 240 ml   Filed Weights   08/08/18 0224  Weight: (!) 137.7 kg    Examination:  General exam: Appears calm and comfortable  Respiratory system: Clear to auscultation. Respiratory effort normal.  On 2 L nasal cannula Cardiovascular system: S1 & S2 heard, RRR. No JVD, murmurs, rubs, gallops or clicks. No pedal edema. Gastrointestinal system: Abdomen is nondistended, soft and nontender. No organomegaly or masses felt. Normal bowel sounds heard. Central nervous system: Alert and oriented. No focal neurological deficits. Extremities: Symmetric 5 x 5 power. Skin: No  rashes, lesions or ulcers Psychiatry: Judgement and insight appear normal. Mood & affect appropriate.     Data Reviewed: I have personally reviewed following labs and imaging studies  CBC: Recent Labs  Lab 08/12/18 0418 08/14/18 0911 08/15/18 0422 08/16/18 0441  WBC 13.9* 17.3* 17.8* 16.5*  HGB 10.3* 11.4* 11.0* 11.1*  HCT 33.8* 37.3 35.8* 36.5  MCV 97.4 99.2 98.6 98.9  PLT 354 425* 418* 696*   Basic Metabolic Panel: Recent Labs  Lab 08/12/18 0418 08/14/18 0911 08/15/18 0422 08/16/18 0441  NA 140 137 138 138  K 3.5 2.6* 3.7 3.8  CL 106 99 102 102  CO2 28 30 29 30   GLUCOSE 111* 118* 95 105*  BUN 16 14 16 15   CREATININE 0.59 0.63 0.59 0.54  CALCIUM 7.6* 7.9* 8.2* 8.2*  MG  --   --  2.1 2.1   GFR: Estimated Creatinine Clearance: 101.5 mL/min (by C-G formula based on SCr of 0.54 mg/dL). Liver Function Tests: No results for input(s): AST, ALT, ALKPHOS, BILITOT, PROT, ALBUMIN in the last 168 hours. No results for input(s): LIPASE, AMYLASE in the last 168 hours. No results for input(s): AMMONIA in the last 168 hours. Coagulation Profile: No results for input(s): INR, PROTIME in the last 168 hours. Cardiac Enzymes: No results for input(s): CKTOTAL, CKMB, CKMBINDEX, TROPONINI in the last 168 hours. BNP (last 3 results) No results for input(s): PROBNP in the last 8760  hours. HbA1C: No results for input(s): HGBA1C in the last 72 hours. CBG: No results for input(s): GLUCAP in the last 168 hours. Lipid Profile: No results for input(s): CHOL, HDL, LDLCALC, TRIG, CHOLHDL, LDLDIRECT in the last 72 hours. Thyroid Function Tests: No results for input(s): TSH, T4TOTAL, FREET4, T3FREE, THYROIDAB in the last 72 hours. Anemia Panel: No results for input(s): VITAMINB12, FOLATE, FERRITIN, TIBC, IRON, RETICCTPCT in the last 72 hours. Sepsis Labs: No results for input(s): PROCALCITON, LATICACIDVEN in the last 168 hours.  No results found for this or any previous visit (from the  past 240 hour(s)).       Radiology Studies: No results found.      Scheduled Meds: . albuterol  2.5 mg Nebulization TID  . budesonide (PULMICORT) nebulizer solution  0.25 mg Nebulization BID  . cycloSPORINE  1 drop Both Eyes BID  . diltiazem  120 mg Oral Daily  . flecainide  100 mg Oral BID  . fluticasone  1 spray Each Nare Daily  . hydrocortisone  25 mg Rectal BID  . ketotifen  1 drop Both Eyes BID  . loratadine  10 mg Oral Daily  . magnesium oxide  400 mg Oral BID  . metoprolol tartrate  25 mg Oral BID  . pantoprazole  40 mg Oral BID  . potassium chloride  40 mEq Oral BID  . [START ON 08/18/2018] predniSONE  20 mg Oral Q breakfast   Followed by  . [START ON 08/20/2018] predniSONE  10 mg Oral Q breakfast   Followed by  . [START ON 08/22/2018] predniSONE  5 mg Oral Q breakfast  . rivaroxaban  20 mg Oral QPC supper  . venlafaxine XR  150 mg Oral Q breakfast   Continuous Infusions: . sodium chloride 250 mL (08/14/18 1027)     LOS: 10 days    Time spent: 29 minutes    Dorisann Schwanke J [place] DO [facility] Pager [phone]  If 7PM-7AM, please contact night-coverage [email or web address] Password [facility] 08/17/2018, 2:07 PM

## 2018-08-17 NOTE — Progress Notes (Signed)
CSW advised this morning by [facility] that [facility] denied authorization for SNF. CSW informed pt (and attempted to involve sister whom she lives with via phone but unable to reach) and pt states she is appealing decision with [employer]. States reason is feeling she is unsafe to return home- states "I need help getting to the bathroom and with walking and my sister won't be there all the time to help with that and we don't have help." Reports stairs to get into and around house are a concern for her.  Met back with pt later and pt has not appealed decision with [facility] reference [place] [id number]. [employer] states pt should have decision on appeal by Tues 08/21/18. Pt aware per [facility] and CSW that staying in hospital awaiting appeal could accrue bill once she is deemed medically stable to DC from inpatient care. She is understanding.   Informed [facility] of pending appeal. Will follow.  Sharren Bridge, MSW, LCSW Clinical Social Work 08/17/2018 [phone]

## 2018-08-18 LAB — BASIC METABOLIC PANEL
Anion gap: 7 (ref 5–15)
BUN: 14 mg/dL (ref 8–23)
CALCIUM: 8.6 mg/dL — AB (ref 8.9–10.3)
CO2: 31 mmol/L (ref 22–32)
Chloride: 100 mmol/L (ref 98–111)
Creatinine, Ser: 0.6 mg/dL (ref 0.44–1.00)
GFR calc Af Amer: >60 mL/min (ref >60)
GFR calc non Af Amer: >60 mL/min (ref >60)
Glucose, Bld: 90 mg/dL (ref 70–99)
Potassium: 4 mmol/L (ref 3.5–5.1)
Sodium: 138 mmol/L (ref 135–145)

## 2018-08-18 LAB — CBC
HCT: 39 % (ref 36.0–46.0)
Hemoglobin: 11.3 g/dL — ABNORMAL LOW (ref 12.0–15.0)
MCH: 29.5 pg (ref 26.0–34.0)
MCHC: 29 g/dL — ABNORMAL LOW (ref 30.0–36.0)
MCV: 101.8 fL — AB (ref 80.0–100.0)
PLATELETS: 463 10*3/uL — AB (ref 150–400)
RBC: 3.83 MIL/uL — ABNORMAL LOW (ref 3.87–5.11)
RDW: 16.2 % — ABNORMAL HIGH (ref 11.5–15.5)
WBC: 14.2 10*3/uL — ABNORMAL HIGH (ref 4.0–10.5)
nRBC: 0 % (ref 0.0–0.2)

## 2018-08-18 LAB — MAGNESIUM: Magnesium: 2.1 mg/dL (ref 1.7–2.4)

## 2018-08-18 NOTE — Progress Notes (Signed)
PROGRESS NOTE    [name] White  GEZ:[id number] DOB: 1955-12-01 DOA: 08/07/2018 PCP: [name] Dawn, MD    Brief Narrative: [name] White a 63 y.o.femalewith medical history significant ofCOPD, depression, GERD, hypertension, history of pulmonary embolism, atrial fibrillation and hypothyroidism presented to the hospital with shortness of breath and cough for 1 week, failed outpatient treatment with antibiotics.  Patient was seen in the ER evaluated and was found to have marked expiratory wheezing.   In the ED, she was started on CPAP which seemed to help. Despite 10 mg of albuterol 0.5 mg of Atrovent 125 mg Solu-Medrol and 2 g of magnesium she continued to wheeze and remained hypoxic with respiratory distress so she was being admitted to the hospital for COPD exacerbation. Chest x-ray showed clearing of the previously seen bibasilar infiltrates.  Patient was continued on antibiotics, oxygen nebulizer, inhaled steroids and IV steroids with gradual improvement in her breathing status.  She however had dyspnea on exertion, debility and deconditioning and physical therapy was consulted who recommended short-term skilled nursing facility placement.   Assessment & Plan:    Principal Problem:   COPD with acute exacerbation ([place]) Active Problems:   Morbid obesity ([place])   HYPERTENSION, BENIGN SYSTEMIC   GASTROESOPHAGEAL REFLUX, NO ESOPHAGITIS   Atrial flutter ([facility])   Hypokalemia   Chronic respiratory failure with hypoxia ([facility])   Leucocytosis   Acute on chronic respiratory failure secondary to acute COPD exacerbation: Patient presented to [facility] long ED with progressive shortness of breath in the setting of recent pneumonia.  Patient utilizes 2 L nasal cannula at baseline.  Initially required CPAP to maintain oxygenation on admission.  She was started on scheduled nebs, inhaled and IV steroids.  Further she completed a course of ceftriaxone and Zithromax for her  pneumonia. --Continue supplemental oxygen, currently at home dose 2 L per nasal cannula --Continue prednisone taper --Continue Pulmicort nebs twice daily while inpatient; plan to resume home Symbicort on discharge --Duo nebs as needed --Supportive care   Severe hypokalemia  Potassium was noted to be 2.6.  She was given oral and IV replacement.  Magnesium level was normal at 2.1. --Potassium level now normalized, 3.8  --continue magnesium 400 mg p.o. twice daily and potassium chloride 40 mg p.o. twice daily. --Continue to monitor daily BMP and magnesium level  Hypertension Patient on Cardizem 120 mg CD daily at home.  Patient was started on metoprolol tartrate 25 mg p.o. twice daily for better blood pressure control.  Patient does have allergy to hydralazine and lisinopril.  Tolerating well, continue to watch for bronchospasm with beta blockade.  History of A. fib and a flutter Continue on Cardizem, flecainide. Continue Xarelto for anticoagulation.  No acute issues.  Beta-blocker was added this admission.  Watch closely for bradycardia  Leukocytosis Probably residual from recent pneumonia and the use of IV steroids.  Will monitor.  Check CBC in a.m.  GERD: Continue Protonix  Debility, deconditioning.  Awaiting for skilled nursing facility placement.  Continue physical therapy.   PT evaluation performed. SNF recommended. SNF appropriate as the patient has > 3 days of hospital care and is felt to need rehabservices to restore this patient to their prior level of function to achieve safe transition back to home care. This patient needs rehab services for at least 5 days per week and skilled nursing services daily to facilitate this transition.  Rehab is being requested as the most appropriate d/c option for this patient and is NOT felt to be  for custodial care as evidenced by independent living prior to hospitalization.    Informed by case management today, that insurance has  declined SNF authorization; patient has appealed decision and [name] will have decision on appeal by Tuesday, 08/21/2018.   DVT prophylaxis: Xarelto Code Status: Full code Family Communication: None Disposition Plan: Medically ready for discharge to SNF, [employer] declined SNF authorization, patient plans to appeal   Consultants:   None  Procedures:   Left PICC 08/11/2018  Antimicrobials:   Zithromax 2/15-2/17   Subjective: Patient seen and examined this morning, resting comfortably in bed.  Breathing continues to improve daily.  Now back to her normal baseline oxygen requirement of 2 L per nasal cannula.  Continues to work with physical therapy.  Continues with global weakness/debility.  No other complaints this morning.  Denies fever/chills/night sweats, no chest pain, no palpitations, no abdominal pain, no fever/chills/night sweats, no cough/congestion, no nausea/vomiting/diarrhea.  No acute events overnight per nursing staff.  Objective: Vitals:   08/17/18 2049 08/17/18 2137 08/18/18 0515 08/18/18 0921  BP: (!) 146/86  132/71   Pulse: 83  67   Resp: 18  18   Temp: 98.9 F (37.2 C)  97.8 F (36.6 C)   TempSrc: Oral  Oral   SpO2: 100% 97% 96% 96%  Weight:      Height:        Intake/Output Summary (Last 24 hours) at 08/18/2018 1254 Last data filed at 08/18/2018 0600 Gross per 24 hour  Intake 730 ml  Output -  Net 730 ml   Filed Weights   08/08/18 0224  Weight: (!) 137.7 kg    Examination:  General exam: Appears calm and comfortable  Respiratory system: Clear to auscultation. Respiratory effort normal.  On 2 L nasal cannula Cardiovascular system: S1 & S2 heard, RRR. No JVD, murmurs, rubs, gallops or clicks. No pedal edema. Gastrointestinal system: Abdomen is nondistended, soft and nontender. No organomegaly or masses felt. Normal bowel sounds heard. Central nervous system: Alert and oriented. No focal neurological deficits. Extremities: Symmetric 5 x 5 power. Skin:  No rashes, lesions or ulcers Psychiatry: Judgement and insight appear normal. Mood & affect appropriate.     Data Reviewed: I have personally reviewed following labs and imaging studies  CBC: Recent Labs  Lab 08/12/18 0418 08/14/18 0911 08/15/18 0422 08/16/18 0441 08/18/18 0419  WBC 13.9* 17.3* 17.8* 16.5* 14.2*  HGB 10.3* 11.4* 11.0* 11.1* 11.3*  HCT 33.8* 37.3 35.8* 36.5 39.0  MCV 97.4 99.2 98.6 98.9 101.8*  PLT 354 425* 418* 420* 188*   Basic Metabolic Panel: Recent Labs  Lab 08/12/18 0418 08/14/18 0911 08/15/18 0422 08/16/18 0441 08/18/18 0419  NA 140 137 138 138 138  K 3.5 2.6* 3.7 3.8 4.0  CL 106 99 102 102 100  CO2 28 30 29 30 31   GLUCOSE 111* 118* 95 105* 90  BUN 16 14 16 15 14   CREATININE 0.59 0.63 0.59 0.54 0.60  CALCIUM 7.6* 7.9* 8.2* 8.2* 8.6*  MG  --   --  2.1 2.1 2.1   GFR: Estimated Creatinine Clearance: 101.5 mL/min (by C-G formula based on SCr of 0.6 mg/dL). Liver Function Tests: No results for input(s): AST, ALT, ALKPHOS, BILITOT, PROT, ALBUMIN in the last 168 hours. No results for input(s): LIPASE, AMYLASE in the last 168 hours. No results for input(s): AMMONIA in the last 168 hours. Coagulation Profile: No results for input(s): INR, PROTIME in the last 168 hours. Cardiac Enzymes: No results for input(s): CKTOTAL,  CKMB, CKMBINDEX, TROPONINI in the last 168 hours. BNP (last 3 results) No results for input(s): PROBNP in the last 8760 hours. HbA1C: No results for input(s): HGBA1C in the last 72 hours. CBG: No results for input(s): GLUCAP in the last 168 hours. Lipid Profile: No results for input(s): CHOL, HDL, LDLCALC, TRIG, CHOLHDL, LDLDIRECT in the last 72 hours. Thyroid Function Tests: No results for input(s): TSH, T4TOTAL, FREET4, T3FREE, THYROIDAB in the last 72 hours. Anemia Panel: No results for input(s): VITAMINB12, FOLATE, FERRITIN, TIBC, IRON, RETICCTPCT in the last 72 hours. Sepsis Labs: No results for input(s): PROCALCITON,  LATICACIDVEN in the last 168 hours.  No results found for this or any previous visit (from the past 240 hour(s)).       Radiology Studies: No results found.      Scheduled Meds: . albuterol  2.5 mg Nebulization TID  . budesonide (PULMICORT) nebulizer solution  0.25 mg Nebulization BID  . cycloSPORINE  1 drop Both Eyes BID  . diltiazem  120 mg Oral Daily  . flecainide  100 mg Oral BID  . fluticasone  1 spray Each Nare Daily  . hydrocortisone  25 mg Rectal BID  . ketotifen  1 drop Both Eyes BID  . loratadine  10 mg Oral Daily  . magnesium oxide  400 mg Oral BID  . metoprolol tartrate  25 mg Oral BID  . pantoprazole  40 mg Oral BID  . potassium chloride  40 mEq Oral BID  . predniSONE  20 mg Oral Q breakfast   Followed by  . [START ON 08/20/2018] predniSONE  10 mg Oral Q breakfast   Followed by  . [START ON 08/22/2018] predniSONE  5 mg Oral Q breakfast  . rivaroxaban  20 mg Oral QPC supper  . venlafaxine XR  150 mg Oral Q breakfast   Continuous Infusions: . sodium chloride 250 mL (08/14/18 1027)     LOS: 11 days    Time spent: 26 minutes    Shanan Mcmiller J [place] DO [facility] Pager [phone]  If 7PM-7AM, please contact night-coverage [email or web address] Password [facility] 08/18/2018, 12:54 PM

## 2018-08-19 NOTE — Progress Notes (Signed)
PROGRESS NOTE    [name] White  ION:[id number] DOB: [birth year] DOA: 08/07/2018 PCP: [name] Dawn, MD    Brief Narrative: [name] White a 63 y.o.femalewith medical history significant ofCOPD, depression, GERD, hypertension, history of pulmonary embolism, atrial fibrillation and hypothyroidism presented to the hospital with shortness of breath and cough for 1 week, failed outpatient treatment with antibiotics.  Patient was seen in the ER evaluated and was found to have marked expiratory wheezing.   In the ED, she was started on CPAP which seemed to help. Despite 10 mg of albuterol 0.5 mg of Atrovent 125 mg Solu-Medrol and 2 g of magnesium she continued to wheeze and remained hypoxic with respiratory distress so she was being admitted to the hospital for COPD exacerbation. Chest x-ray showed clearing of the previously seen bibasilar infiltrates.  Patient was continued on antibiotics, oxygen nebulizer, inhaled steroids and IV steroids with gradual improvement in her breathing status.  She however had dyspnea on exertion, debility and deconditioning and physical therapy was consulted who recommended short-term skilled nursing facility placement.   Assessment & Plan:    Principal Problem:   COPD with acute exacerbation ([place]) Active Problems:   Morbid obesity ([place])   HYPERTENSION, BENIGN SYSTEMIC   GASTROESOPHAGEAL REFLUX, NO ESOPHAGITIS   Atrial flutter ([facility])   Hypokalemia   Chronic respiratory failure with hypoxia ([facility])   Leucocytosis   Acute on chronic respiratory failure secondary to acute COPD exacerbation: Patient presented to [facility] long ED with progressive shortness of breath in the setting of recent pneumonia.  Patient utilizes 2 L nasal cannula at baseline.  Initially required CPAP to maintain oxygenation on admission.  She was started on scheduled nebs, inhaled and IV steroids.  Further she completed a course of ceftriaxone and Zithromax for her  pneumonia. --Continue supplemental oxygen, currently at home dose 2 L per nasal cannula --Continue prednisone taper --Continue Pulmicort nebs twice daily while inpatient; plan to resume home Symbicort on discharge --Duo nebs as needed --Supportive care   Severe hypokalemia  Potassium was noted to be 2.6.  She was given oral and IV replacement.  Magnesium level was normal at 2.1. --Potassium level now normalized, 3.8  --continue magnesium 400 mg p.o. twice daily and potassium chloride 40 mg p.o. twice daily. --Continue to monitor BMP and magnesium level  Hypertension Patient on Cardizem 120 mg CD daily at home.  Patient was started on metoprolol tartrate 25 mg p.o. twice daily for better blood pressure control.  Patient does have allergy to hydralazine and lisinopril.  Tolerating well, continue to watch for bronchospasm with beta blockade.  History of A. fib and a flutter Continue on Cardizem, flecainide. Continue Xarelto for anticoagulation.  No acute issues.  Beta-blocker was added this admission.  Watch closely for bradycardia  Leukocytosis Probably residual from recent pneumonia and the use of IV steroids.  Will monitor.  Check CBC in a.m.  GERD: Continue Protonix  Debility, deconditioning.  Awaiting for skilled nursing facility placement.  Continue physical therapy.   PT evaluation performed. SNF recommended. SNF appropriate as the patient has > 3 days of hospital care and is felt to need rehabservices to restore this patient to their prior level of function to achieve safe transition back to home care. This patient needs rehab services for at least 5 days per week and skilled nursing services daily to facilitate this transition.  Rehab is being requested as the most appropriate d/c option for this patient and is NOT felt to be for  custodial care as evidenced by independent living prior to hospitalization.    Informed by case management, that insurance has declined SNF  authorization; patient has appealed decision and [name] White will have decision on appeal by Tuesday, 08/21/2018.   DVT prophylaxis: Xarelto Code Status: Full code Family Communication: None Disposition Plan: Medically ready for discharge to SNF, [employer] declined SNF authorization, patient plans to appeal   Consultants:   None  Procedures:   Left PICC 08/11/2018  Antimicrobials:   Zithromax 2/15-2/17   Subjective: Patient seen and examined this morning, resting comfortably in bed.  Currently getting breathing treatment. Now back to her normal baseline oxygen requirement of 2 L per nasal cannula.  Continues to work with physical therapy.  Continues with global weakness/debility and balance issues.  No other complaints this morning.  Denies fever/chills/night sweats, no chest pain, no palpitations, no abdominal pain, no fever/chills/night sweats, no cough/congestion, no nausea/vomiting/diarrhea.  No acute events overnight per nursing staff.  Objective: Vitals:   08/18/18 2042 08/18/18 2140 08/19/18 0428 08/19/18 0854  BP: 129/76  131/71   Pulse: 81  61   Resp: 18  18   Temp: 98.1 F (36.7 C)  98.2 F (36.8 C)   TempSrc: Oral  Oral   SpO2: 100% 98% 97% 96%  Weight:      Height:       No intake or output data in the 24 hours ending 08/19/18 1109 Filed Weights   08/08/18 0224  Weight: (!) 137.7 kg    Examination:  General exam: Appears calm and comfortable  Respiratory system: Clear to auscultation. Respiratory effort normal.  On 2 L nasal cannula Cardiovascular system: S1 & S2 heard, RRR. No JVD, murmurs, rubs, gallops or clicks. No pedal edema. Gastrointestinal system: Abdomen is nondistended, soft and nontender. No organomegaly or masses felt. Normal bowel sounds heard. Central nervous system: Alert and oriented. No focal neurological deficits. Extremities: Symmetric 5 x 5 power. Skin: No rashes, lesions or ulcers Psychiatry: Judgement and insight appear normal. Mood &  affect appropriate.     Data Reviewed: I have personally reviewed following labs and imaging studies  CBC: Recent Labs  Lab 08/14/18 0911 08/15/18 0422 08/16/18 0441 08/18/18 0419  WBC 17.3* 17.8* 16.5* 14.2*  HGB 11.4* 11.0* 11.1* 11.3*  HCT 37.3 35.8* 36.5 39.0  MCV 99.2 98.6 98.9 101.8*  PLT 425* 418* 420* 099*   Basic Metabolic Panel: Recent Labs  Lab 08/14/18 0911 08/15/18 0422 08/16/18 0441 08/18/18 0419  NA 137 138 138 138  K 2.6* 3.7 3.8 4.0  CL 99 102 102 100  CO2 30 29 30 31   GLUCOSE 118* 95 105* 90  BUN 14 16 15 14   CREATININE 0.63 0.59 0.54 0.60  CALCIUM 7.9* 8.2* 8.2* 8.6*  MG  --  2.1 2.1 2.1   GFR: Estimated Creatinine Clearance: 101.5 mL/min (by C-G formula based on SCr of 0.6 mg/dL). Liver Function Tests: No results for input(s): AST, ALT, ALKPHOS, BILITOT, PROT, ALBUMIN in the last 168 hours. No results for input(s): LIPASE, AMYLASE in the last 168 hours. No results for input(s): AMMONIA in the last 168 hours. Coagulation Profile: No results for input(s): INR, PROTIME in the last 168 hours. Cardiac Enzymes: No results for input(s): CKTOTAL, CKMB, CKMBINDEX, TROPONINI in the last 168 hours. BNP (last 3 results) No results for input(s): PROBNP in the last 8760 hours. HbA1C: No results for input(s): HGBA1C in the last 72 hours. CBG: No results for input(s): GLUCAP in the last  168 hours. Lipid Profile: No results for input(s): CHOL, HDL, LDLCALC, TRIG, CHOLHDL, LDLDIRECT in the last 72 hours. Thyroid Function Tests: No results for input(s): TSH, T4TOTAL, FREET4, T3FREE, THYROIDAB in the last 72 hours. Anemia Panel: No results for input(s): VITAMINB12, FOLATE, FERRITIN, TIBC, IRON, RETICCTPCT in the last 72 hours. Sepsis Labs: No results for input(s): PROCALCITON, LATICACIDVEN in the last 168 hours.  No results found for this or any previous visit (from the past 240 hour(s)).       Radiology Studies: No results  found.      Scheduled Meds: . albuterol  2.5 mg Nebulization TID  . budesonide (PULMICORT) nebulizer solution  0.25 mg Nebulization BID  . cycloSPORINE  1 drop Both Eyes BID  . diltiazem  120 mg Oral Daily  . flecainide  100 mg Oral BID  . fluticasone  1 spray Each Nare Daily  . hydrocortisone  25 mg Rectal BID  . ketotifen  1 drop Both Eyes BID  . loratadine  10 mg Oral Daily  . magnesium oxide  400 mg Oral BID  . metoprolol tartrate  25 mg Oral BID  . pantoprazole  40 mg Oral BID  . potassium chloride  40 mEq Oral BID  . [START ON 08/20/2018] predniSONE  10 mg Oral Q breakfast   Followed by  . [START ON 08/22/2018] predniSONE  5 mg Oral Q breakfast  . rivaroxaban  20 mg Oral QPC supper  . venlafaxine XR  150 mg Oral Q breakfast   Continuous Infusions: . sodium chloride 250 mL (08/14/18 1027)     LOS: 12 days    Time spent: 27 minutes    Sonal Dorwart J [place] DO [facility] Pager [phone]  If 7PM-7AM, please contact night-coverage [email or web address] Password [facility] 08/19/2018, 11:09 AM

## 2018-08-20 DIAGNOSIS — J209 Acute bronchitis, unspecified: Secondary | ICD-10-CM | POA: Diagnosis not present

## 2018-08-20 DIAGNOSIS — M170 Bilateral primary osteoarthritis of knee: Secondary | ICD-10-CM | POA: Diagnosis not present

## 2018-08-20 DIAGNOSIS — I10 Essential (primary) hypertension: Secondary | ICD-10-CM | POA: Diagnosis not present

## 2018-08-20 DIAGNOSIS — Z881 Allergy status to other antibiotic agents status: Secondary | ICD-10-CM | POA: Diagnosis not present

## 2018-08-20 DIAGNOSIS — Z86711 Personal history of pulmonary embolism: Secondary | ICD-10-CM | POA: Diagnosis not present

## 2018-08-20 DIAGNOSIS — I480 Paroxysmal atrial fibrillation: Secondary | ICD-10-CM | POA: Diagnosis not present

## 2018-08-20 DIAGNOSIS — J80 Acute respiratory distress syndrome: Secondary | ICD-10-CM | POA: Diagnosis not present

## 2018-08-20 DIAGNOSIS — G43909 Migraine, unspecified, not intractable, without status migrainosus: Secondary | ICD-10-CM | POA: Diagnosis present

## 2018-08-20 DIAGNOSIS — R000 Tachycardia, unspecified: Secondary | ICD-10-CM | POA: Diagnosis not present

## 2018-08-20 DIAGNOSIS — F332 Major depressive disorder, recurrent severe without psychotic features: Secondary | ICD-10-CM | POA: Diagnosis not present

## 2018-08-20 DIAGNOSIS — Z825 Family history of asthma and other chronic lower respiratory diseases: Secondary | ICD-10-CM | POA: Diagnosis not present

## 2018-08-20 DIAGNOSIS — K219 Gastro-esophageal reflux disease without esophagitis: Secondary | ICD-10-CM | POA: Diagnosis not present

## 2018-08-20 DIAGNOSIS — M545 Low back pain: Secondary | ICD-10-CM | POA: Diagnosis not present

## 2018-08-20 DIAGNOSIS — Z6841 Body Mass Index (BMI) 40.0 and over, adult: Secondary | ICD-10-CM | POA: Diagnosis not present

## 2018-08-20 DIAGNOSIS — I4892 Unspecified atrial flutter: Secondary | ICD-10-CM | POA: Diagnosis not present

## 2018-08-20 DIAGNOSIS — Z8249 Family history of ischemic heart disease and other diseases of the circulatory system: Secondary | ICD-10-CM | POA: Diagnosis not present

## 2018-08-20 DIAGNOSIS — Z885 Allergy status to narcotic agent status: Secondary | ICD-10-CM | POA: Diagnosis not present

## 2018-08-20 DIAGNOSIS — Z7901 Long term (current) use of anticoagulants: Secondary | ICD-10-CM | POA: Diagnosis not present

## 2018-08-20 DIAGNOSIS — J449 Chronic obstructive pulmonary disease, unspecified: Secondary | ICD-10-CM | POA: Diagnosis not present

## 2018-08-20 DIAGNOSIS — R0689 Other abnormalities of breathing: Secondary | ICD-10-CM | POA: Diagnosis not present

## 2018-08-20 DIAGNOSIS — R079 Chest pain, unspecified: Secondary | ICD-10-CM | POA: Diagnosis not present

## 2018-08-20 DIAGNOSIS — Z87891 Personal history of nicotine dependence: Secondary | ICD-10-CM | POA: Diagnosis not present

## 2018-08-20 DIAGNOSIS — Z9981 Dependence on supplemental oxygen: Secondary | ICD-10-CM | POA: Diagnosis not present

## 2018-08-20 DIAGNOSIS — F339 Major depressive disorder, recurrent, unspecified: Secondary | ICD-10-CM | POA: Diagnosis not present

## 2018-08-20 DIAGNOSIS — R0602 Shortness of breath: Secondary | ICD-10-CM | POA: Diagnosis not present

## 2018-08-20 DIAGNOSIS — Z888 Allergy status to other drugs, medicaments and biological substances status: Secondary | ICD-10-CM | POA: Diagnosis not present

## 2018-08-20 DIAGNOSIS — J45901 Unspecified asthma with (acute) exacerbation: Secondary | ICD-10-CM | POA: Diagnosis not present

## 2018-08-20 DIAGNOSIS — I482 Chronic atrial fibrillation, unspecified: Secondary | ICD-10-CM | POA: Diagnosis not present

## 2018-08-20 DIAGNOSIS — G8929 Other chronic pain: Secondary | ICD-10-CM | POA: Diagnosis not present

## 2018-08-20 DIAGNOSIS — J441 Chronic obstructive pulmonary disease with (acute) exacerbation: Secondary | ICD-10-CM | POA: Diagnosis not present

## 2018-08-20 DIAGNOSIS — J9621 Acute and chronic respiratory failure with hypoxia: Secondary | ICD-10-CM | POA: Diagnosis not present

## 2018-08-20 DIAGNOSIS — J9611 Chronic respiratory failure with hypoxia: Secondary | ICD-10-CM | POA: Diagnosis not present

## 2018-08-20 DIAGNOSIS — J181 Lobar pneumonia, unspecified organism: Secondary | ICD-10-CM | POA: Diagnosis not present

## 2018-08-20 DIAGNOSIS — B349 Viral infection, unspecified: Secondary | ICD-10-CM | POA: Diagnosis not present

## 2018-08-20 DIAGNOSIS — J208 Acute bronchitis due to other specified organisms: Secondary | ICD-10-CM | POA: Diagnosis not present

## 2018-08-20 DIAGNOSIS — Z803 Family history of malignant neoplasm of breast: Secondary | ICD-10-CM | POA: Diagnosis not present

## 2018-08-20 DIAGNOSIS — G4730 Sleep apnea, unspecified: Secondary | ICD-10-CM | POA: Diagnosis not present

## 2018-08-20 MED ORDER — HYDROCORTISONE ACETATE 25 MG RE SUPP: 25.0000 mg | Freq: Two times a day (BID) | RECTAL | 0 refills | Status: DC

## 2018-08-20 MED ORDER — ALBUTEROL SULFATE (2.5 MG/3ML) 0.083% IN NEBU: 2.5000 mg | INHALATION_SOLUTION | Freq: Two times a day (BID) | RESPIRATORY_TRACT | Status: DC

## 2018-08-20 MED ORDER — POTASSIUM CHLORIDE CRYS ER 20 MEQ PO TBCR: 40.0000 meq | EXTENDED_RELEASE_TABLET | Freq: Two times a day (BID) | ORAL | Status: DC

## 2018-08-20 MED ORDER — PANTOPRAZOLE SODIUM 40 MG PO TBEC: 40.0000 mg | DELAYED_RELEASE_TABLET | Freq: Two times a day (BID) | ORAL | Status: DC

## 2018-08-20 MED ORDER — METOPROLOL TARTRATE 25 MG PO TABS: 25.0000 mg | ORAL_TABLET | Freq: Two times a day (BID) | ORAL | Status: DC

## 2018-08-20 MED ORDER — WITCH HAZEL-GLYCERIN EX PADS: MEDICATED_PAD | CUTANEOUS | 12 refills | Status: DC | PRN

## 2018-08-20 MED ORDER — PREDNISONE 5 MG PO TABS: ORAL_TABLET | ORAL | 0 refills | Status: AC

## 2018-08-20 MED ORDER — MAGNESIUM OXIDE 400 (241.3 MG) MG PO TABS: 400.0000 mg | ORAL_TABLET | Freq: Two times a day (BID) | ORAL | Status: DC

## 2018-08-20 NOTE — Progress Notes (Signed)
Writer called to patient's room. [facility] here to transport patient. Patient insisting that she will not leave her things here. She has about 6 bags of belongings. [facility] can only take 1-2 bags. Unable to reach family to see if they can transport belongings and pateint states that they will not be available today to help with this. Patient asked about using [employer] [employer] to carry her things and they follow behind [facility]. Writer explained that if she chose to do that, then she would be leaving her belongings in the possession of that [place] driver and that the hospital would not be responsible. RN explained to the patient that she will be leaving out of a different entrance than where the [place] driver can come. Patient asked if she could ride in the [place] herself. RN explained that she could but that she would have no Oxygen to use while in the [place] (the patient previously stated that she was using [facility] because she needed oxygen). The patient stated that the Respiratory Therapist had told her earlier today that she didn't need the oxygen while resting and that she only needed it with exertion. RN informed patient that if she felt that she could make the ride in the [place] to [employer] without the oxygen, then she could travel via [place]. Writer also spoke with CSW who verified that the patient could travel via [place] is she was agreeable. Writer requested CSW to notify [employer] that the patient will be arriving via [place] and that she will need assistance getting herself and her belongings from the car. CSW verified that she would notify [employer]. Patient assisted into [place] with her belongings. Discharge paperwork placed in patient's belongings and patient instructed to give paperwork to staff at [facility].   Lakia Gritton, [place] Din 08/20/2018 4:18 PM

## 2018-08-20 NOTE — Discharge Summary (Signed)
Physician Discharge Summary  [name] OSWALD DGL:[id number] DOB: 25-Mar-1956 DOA: 08/07/2018  PCP: [name], MD  Admit date: 08/07/2018 Discharge date: 08/20/2018  Admitted From: Home Disposition: Skilled nursing facility; [facility] healthcare  Recommendations for Outpatient Follow-up:  1. Follow up with PCP in 1 week 2. Please obtain BMP and magnesium level at visit    Home Health: Now Equipment/Devices: Oxygen 2 L/min per nasal cannula  Discharge Condition: Stable CODE STATUS: Full code Diet recommendation: Heart Healthy  History of present illness:  [name] White a 63 y.o.femalewith medical history significant ofCOPD, depression, GERD, hypertension, history of pulmonary embolism, atrial fibrillation and hypothyroidism presented to the hospital with shortness of breath and cough for 1 week, failed outpatient treatment with antibiotics. Patient wasseen in the ER evaluated and was found to have marked expiratory wheezing.   In the ED, she was started on CPAP which seemed to help. Despite 10 mg of albuterol 0.5 mg of Atrovent 125 mg Solu-Medrol and 2 g of magnesium she continued to wheeze and remained hypoxic with respiratory distress so she was being admitted to the hospital for COPD exacerbation.Chest x-ray showed clearing of the previously seen bibasilar infiltrates. Patient was continued on antibiotics, oxygen nebulizer, inhaled steroids and IV steroids with gradual improvement in her breathing status. She however had dyspnea on exertion, debility and deconditioning and physical therapy was consulted who recommended short-term skilled nursing facility placement.  Hospital course:  Acute on chronic respiratory failure secondary to acute COPD exacerbation: Patient presented to [facility] ED with progressive shortness of breath in the setting of recent pneumonia.  Patient utilizes 2 L nasal cannula at baseline.  Initially required CPAP to maintain oxygenation on  admission.  She was started on scheduled nebs, inhaled and IV steroids.  Further she completed a course of ceftriaxone and Zithromax for her pneumonia.  She was able to be titrated down to her home dose of 2 L/min of nasal cannula.  She will continue on a prednisone taper for an additional 3 days following discharge.  She will resume her home Symbicort with nebs as needed for shortness of breath.  Severe hypokalemia Potassium was noted to be 2.6.  She was given oral and IV replacement during initial hospitalization.  Patient was then started on magnesium 400 mg p.o. twice daily and potassium chloride 40 mg p.o. twice daily.  Her potassium and magnesium had normalized.  Recommend repeat BMP in 1 week with magnesium level for further assessment.  Hypertension Patient on Cardizem 120 mg CD daily at home.  Patient was started on metoprolol tartrate 25 mg p.o. twice daily for better blood pressure control.  Patient does have allergy to hydralazine and lisinopril.  Blood pressure now at goal.  Continue to monitor and adjust as needed.  History of A. fib and a flutter Continue on Cardizem, flecainide. Continue Xarelto for anticoagulation. No acute issues.Beta-blocker was added this admission.  GERD: ContinueProtonix  Internal hemorrhoids: Continue Anusol PR as needed  Debility, deconditioning. Worked with physical therapy during her inpatient hospitalization and will now discharge to SNF for further strengthening prior to her returning home.   Discharge Diagnoses:  Active Problems:   Morbid obesity (HCC)   HYPERTENSION, BENIGN SYSTEMIC   GASTROESOPHAGEAL REFLUX, NO ESOPHAGITIS   Atrial flutter (HCC)   Chronic respiratory failure with hypoxia [facility])    Discharge Instructions  Discharge Instructions    Diet - low sodium heart healthy   Complete by:  As directed    Increase activity slowly  Complete by:  As directed      Allergies as of 08/20/2018      Reactions    Caffeine Other (See Comments)   Migraine   Vicodin [hydrocodone-acetaminophen] Nausea And Vomiting, Other (See Comments)   Headache    Hydralazine Hcl    Other reaction(s): Other (See Comments) AKI leading to rhabdo and electrolyte abnormalities   Hydrocodone-acetaminophen Nausea And Vomiting   Ciprofloxacin Hives, Rash   Erythromycin Hives, Rash   Lisinopril Cough   Other reaction(s): Cough   Oxycodone Nausea And Vomiting   headache Other reaction(s): Vomiting   Sulfamethoxazole-trimethoprim Rash      Medication List    STOP taking these medications   CORICIDIN HBP COLD/FLU PO   DELSYM COUGH RELIEF MT   meclizine 12.5 MG tablet Commonly known as:  ANTIVERT     TAKE these medications   acetaminophen 650 MG CR tablet Commonly known as:  TYLENOL Take 1,300 mg by mouth daily as needed for pain.   albuterol 108 (90 Base) MCG/ACT inhaler Commonly known as:  PROVENTIL HFA;VENTOLIN HFA Inhale 2 puffs into the lungs every 6 (six) hours as needed for wheezing or shortness of breath.   albuterol (2.5 MG/3ML) 0.083% nebulizer solution Commonly known as:  PROVENTIL Take 3 mLs (2.5 mg total) by nebulization every 6 (six) hours as needed for wheezing or shortness of breath.   azelastine 0.05 % ophthalmic solution Commonly known as:  OPTIVAR Place 1 drop into both eyes daily.   budesonide-formoterol 160-4.5 MCG/ACT inhaler Commonly known as:  SYMBICORT Inhale 2 puffs into the lungs 2 (two) times daily.   cycloSPORINE 0.05 % ophthalmic emulsion Commonly known as:  RESTASIS Place 1 drop into both eyes 2 (two) times daily.   diclofenac sodium 1 % Gel Commonly known as:  VOLTAREN APPLY 2 GRAMS EXTERNALLY TO THE AFFECTED AREA FOUR TIMES DAILY   diltiazem 120 MG 24 hr capsule Commonly known as:  CARDIZEM CD Take 1 capsule (120 mg total) by mouth daily. Please keep upcoming appt in January before anymore refills. Final Attempt   flecainide 100 MG tablet Commonly known as:   TAMBOCOR Take 1 tablet (100 mg total) by mouth 2 (two) times daily. Please keep upcoming appt in January before anymore refills. Final Attempt   fluticasone 50 MCG/ACT nasal spray Commonly known as:  FLONASE Place 1 spray into both nostrils daily.   hydrocortisone 25 MG suppository Commonly known as:  ANUSOL-HC Place 1 suppository (25 mg total) rectally 2 (two) times daily.   hydrOXYzine 25 MG tablet Commonly known as:  ATARAX/VISTARIL Take 25 mg by mouth 2 (two) times daily as needed for anxiety.   loratadine 10 MG tablet Commonly known as:  CLARITIN Take 10 mg by mouth daily.   magnesium oxide 400 (241.3 Mg) MG tablet Commonly known as:  MAG-OX Take 1 tablet (400 mg total) by mouth 2 (two) times daily.   metoprolol tartrate 25 MG tablet Commonly known as:  LOPRESSOR Take 1 tablet (25 mg total) by mouth 2 (two) times daily.   pantoprazole 40 MG tablet Commonly known as:  PROTONIX Take 1 tablet (40 mg total) by mouth 2 (two) times daily. What changed:  when to take this   potassium chloride SA 20 MEQ tablet Commonly known as:  K-DUR,KLOR-CON Take 2 tablets (40 mEq total) by mouth 2 (two) times daily. What changed:  when to take this   predniSONE 5 MG tablet Commonly known as:  DELTASONE Take 2 tablets (10 mg  total) by mouth daily with breakfast for 1 day, THEN 1 tablet (5 mg total) daily with breakfast for 2 days. Start taking on:  August 21, 2018 What changed:    medication strength  See the new instructions.   rivaroxaban 20 MG Tabs tablet Commonly known as:  XARELTO TAKE 1 TABLET(20 MG) BY MOUTH DAILY AFTER SUPPER   venlafaxine XR 150 MG 24 hr capsule Commonly known as:  EFFEXOR-XR Take 150 mg by mouth daily with breakfast.   witch hazel-glycerin pad Commonly known as:  TUCKS Apply topically as needed for itching.       Contact information for follow-up providers    [name], MD. Schedule an appointment as soon as possible for a visit in 1  week(s).   Specialty:  Family Medicine Contact information: [address] [place] [address] [phone]            Contact information for after-discharge care    Destination    [facility] Preferred SNF .   Service:  Skilled Nursing Contact information: [address] [address] [phone]                 Allergies  Allergen Reactions  . Caffeine Other (See Comments)    Migraine  . Vicodin [Hydrocodone-Acetaminophen] Nausea And Vomiting and Other (See Comments)    Headache   . Hydralazine Hcl     Other reaction(s): Other (See Comments) AKI leading to rhabdo and electrolyte abnormalities  . Hydrocodone-Acetaminophen Nausea And Vomiting  . Ciprofloxacin Hives and Rash  . Erythromycin Hives and Rash  . Lisinopril Cough    Other reaction(s): Cough  . Oxycodone Nausea And Vomiting    headache Other reaction(s): Vomiting  . Sulfamethoxazole-Trimethoprim Rash    Consultations:   None   Procedures/Studies: Dg Chest Port 1 View  Result Date: 08/07/2018 CLINICAL DATA:  Respiratory distress for 1 week EXAM: PORTABLE CHEST 1 VIEW COMPARISON:  07/01/2018 FINDINGS: Cardiac shadow is stable. Aortic calcifications are again seen. Lungs are well aerated bilaterally. No focal infiltrate or sizable effusion is seen. Previously noted infiltrates have resolved. No bony abnormality is seen. IMPRESSION: Clearing of previously seen basilar infiltrates. Electronically Signed   By: Inez Catalina M.D.   On: 08/07/2018 22:00    PICC line LUE 2/15: Discontinued at time of discharge   Subjective:   Discharge Exam: Vitals:   08/20/18 0831 08/20/18 0837  BP:    Pulse:    Resp:    Temp:    SpO2: 97% 97%   Vitals:   08/20/18 0059 08/20/18 0524 08/20/18 0831 08/20/18 0837  BP: (!) 121/101 137/76    Pulse: 63 76    Resp:  19    Temp:  98.2 F (36.8 C)    TempSrc:  Oral    SpO2:  96% 97% 97%  Weight:      Height:         General: Pt is alert, awake, not in acute distress Cardiovascular: RRR, S1/S2 +, no rubs, no gallops Respiratory: CTA bilaterally, no wheezing, no rhonchi Abdominal: Soft, NT, ND, bowel sounds + Extremities: no edema, no cyanosis    The results of significant diagnostics from this hospitalization (including imaging, microbiology, ancillary and laboratory) are listed below for reference.     Microbiology: No results found for this or any previous visit (from the past 240 hour(s)).   Labs: BNP (last 3 results) Recent Labs    07/01/18 0836  BNP 22.4  Basic Metabolic Panel: Recent Labs  Lab 08/14/18 0911 08/15/18 0422 08/16/18 0441 08/18/18 0419  NA 137 138 138 138  K 2.6* 3.7 3.8 4.0  CL 99 102 102 100  CO2 30 29 30 31   GLUCOSE 118* 95 105* 90  BUN 14 16 15 14   CREATININE 0.63 0.59 0.54 0.60  CALCIUM 7.9* 8.2* 8.2* 8.6*  MG  --  2.1 2.1 2.1   Liver Function Tests: No results for input(s): AST, ALT, ALKPHOS, BILITOT, PROT, ALBUMIN in the last 168 hours. No results for input(s): LIPASE, AMYLASE in the last 168 hours. No results for input(s): AMMONIA in the last 168 hours. CBC: Recent Labs  Lab 08/14/18 0911 08/15/18 0422 08/16/18 0441 08/18/18 0419  WBC 17.3* 17.8* 16.5* 14.2*  HGB 11.4* 11.0* 11.1* 11.3*  HCT 37.3 35.8* 36.5 39.0  MCV 99.2 98.6 98.9 101.8*  PLT 425* 418* 420* 463*   Cardiac Enzymes: No results for input(s): CKTOTAL, CKMB, CKMBINDEX, TROPONINI in the last 168 hours. BNP: Invalid input(s): POCBNP CBG: No results for input(s): GLUCAP in the last 168 hours. D-Dimer No results for input(s): DDIMER in the last 72 hours. Hgb A1c No results for input(s): HGBA1C in the last 72 hours. Lipid Profile No results for input(s): CHOL, HDL, LDLCALC, TRIG, CHOLHDL, LDLDIRECT in the last 72 hours. Thyroid function studies No results for input(s): TSH, T4TOTAL, T3FREE, THYROIDAB in the last 72 hours.  Invalid input(s): FREET3 Anemia work up No  results for input(s): VITAMINB12, FOLATE, FERRITIN, TIBC, IRON, RETICCTPCT in the last 72 hours. Urinalysis    Component Value Date/Time   COLORURINE Yellow 10/02/2014 [place] 01/31/2014 0745   APPEARANCEUR Clear 10/02/2014 1645   LABSPEC 1.018 10/02/2014 1645   PHURINE 6.0 10/02/2014 1645   PHURINE 6.5 01/31/2014 0745   GLUCOSEU Negative 10/02/2014 1645   HGBUR Negative 10/02/2014 1645   HGBUR TRACE (A) 01/31/2014 0745   BILIRUBINUR Negative 10/02/2014 1645   KETONESUR Negative 10/02/2014 1645   KETONESUR 15 (A) 01/31/2014 0745   PROTEINUR Negative 10/02/2014 1645   PROTEINUR NEGATIVE 01/31/2014 0745   UROBILINOGEN 1.0 01/31/2014 0745   NITRITE Negative 10/02/2014 1645   NITRITE NEGATIVE 01/31/2014 0745   LEUKOCYTESUR Negative 10/02/2014 1645   Sepsis Labs Invalid input(s): PROCALCITONIN,  WBC,  LACTICIDVEN Microbiology No results found for this or any previous visit (from the past 240 hour(s)).   Time coordinating discharge: Over 30 minutes  SIGNED:   Donnamarie Poag [place] DO  [facility] 08/20/2018, 12:36 PM

## 2018-08-20 NOTE — Clinical Social Work Placement (Signed)
Pt's appeal for SNF admission authorization was approved by [facility] this morning. Authorized 7 days expires 08/27/18. Pt admitting to [facility] today- report [phone]. Arranging [facility] transportation.  Pt expressed thanks for care to staff.   CLINICAL SOCIAL WORK PLACEMENT  NOTE  Date:  08/20/2018  Patient Details  Name: [name] White MRN: [id number] Date of Birth: 1956/02/05  Clinical Social Work is seeking post-discharge placement for this patient at the [place] level of care (*CSW will initial, date and re-position this form in  chart as items are completed):  Yes   Patient/family provided with [place] Work Department's list of facilities offering this level of care within the geographic area requested by the patient (or if unable, by the patient's family).  Yes   Patient/family informed of their freedom to choose among providers that offer the needed level of care, that participate in Medicare, Medicaid or managed care program needed by the patient, have an available bed and are willing to accept the patient.  Yes   Patient/family informed of [facility]'s ownership interest in [facility] and [facility], as well as of the fact that they are under no obligation to receive care at these facilities.  PASRR submitted to EDS on 08/13/18     PASRR number received on 08/14/18     Existing PASRR number confirmed on       FL2 transmitted to all facilities in geographic area requested by pt/family on 08/13/18     FL2 transmitted to all facilities within larger geographic area on       Patient informed that his/her managed care company has contracts with or will negotiate with certain facilities, including the following:        Yes   Patient/family informed of bed offers received.  Patient chooses bed at [facility]     Physician recommends and patient chooses bed at [facility]    Patient to be  transferred to [facility] on 08/20/18.  Patient to be transferred to facility by [facility]     Patient family notified on 08/20/18 of transfer.  Name of family member notified:  patient notified her sister     PHYSICIAN       Additional Comment:    _______________________________________________ Nila Nephew, LCSW 08/20/2018, 1:42 PM [phone]

## 2018-08-28 DIAGNOSIS — J441 Chronic obstructive pulmonary disease with (acute) exacerbation: Secondary | ICD-10-CM | POA: Diagnosis not present

## 2018-08-28 DIAGNOSIS — J9621 Acute and chronic respiratory failure with hypoxia: Secondary | ICD-10-CM | POA: Diagnosis not present

## 2018-08-28 DIAGNOSIS — I10 Essential (primary) hypertension: Secondary | ICD-10-CM | POA: Diagnosis not present

## 2018-08-28 DIAGNOSIS — I480 Paroxysmal atrial fibrillation: Secondary | ICD-10-CM | POA: Diagnosis not present

## 2018-08-29 ENCOUNTER — Other Ambulatory Visit: Payer: Self-pay

## 2018-08-29 ENCOUNTER — Encounter (HOSPITAL_COMMUNITY): Payer: Self-pay

## 2018-08-29 ENCOUNTER — Inpatient Hospital Stay (HOSPITAL_COMMUNITY)
Admission: EM | Admit: 2018-08-29 | Discharge: 2018-09-07 | DRG: 202 | Disposition: A | Discharge status: Skilled Nursing Facility | Payer: Medicare HMO | Source: Skilled Nursing Facility | Attending: Internal Medicine | Admitting: Internal Medicine

## 2018-08-29 ENCOUNTER — Emergency Department (HOSPITAL_COMMUNITY): Payer: Medicare HMO

## 2018-08-29 DIAGNOSIS — F332 Major depressive disorder, recurrent severe without psychotic features: Secondary | ICD-10-CM | POA: Diagnosis present

## 2018-08-29 DIAGNOSIS — Z885 Allergy status to narcotic agent status: Secondary | ICD-10-CM | POA: Diagnosis not present

## 2018-08-29 DIAGNOSIS — M545 Low back pain: Secondary | ICD-10-CM | POA: Diagnosis not present

## 2018-08-29 DIAGNOSIS — Z8249 Family history of ischemic heart disease and other diseases of the circulatory system: Secondary | ICD-10-CM

## 2018-08-29 DIAGNOSIS — J208 Acute bronchitis due to other specified organisms: Secondary | ICD-10-CM | POA: Diagnosis present

## 2018-08-29 DIAGNOSIS — J209 Acute bronchitis, unspecified: Secondary | ICD-10-CM

## 2018-08-29 DIAGNOSIS — R000 Tachycardia, unspecified: Secondary | ICD-10-CM | POA: Diagnosis not present

## 2018-08-29 DIAGNOSIS — Z87891 Personal history of nicotine dependence: Secondary | ICD-10-CM | POA: Diagnosis not present

## 2018-08-29 DIAGNOSIS — I10 Essential (primary) hypertension: Secondary | ICD-10-CM | POA: Diagnosis not present

## 2018-08-29 DIAGNOSIS — J9621 Acute and chronic respiratory failure with hypoxia: Secondary | ICD-10-CM | POA: Diagnosis not present

## 2018-08-29 DIAGNOSIS — R609 Edema, unspecified: Secondary | ICD-10-CM | POA: Diagnosis not present

## 2018-08-29 DIAGNOSIS — G8929 Other chronic pain: Secondary | ICD-10-CM | POA: Diagnosis present

## 2018-08-29 DIAGNOSIS — Z803 Family history of malignant neoplasm of breast: Secondary | ICD-10-CM

## 2018-08-29 DIAGNOSIS — G43909 Migraine, unspecified, not intractable, without status migrainosus: Secondary | ICD-10-CM | POA: Diagnosis present

## 2018-08-29 DIAGNOSIS — Z7901 Long term (current) use of anticoagulants: Secondary | ICD-10-CM

## 2018-08-29 DIAGNOSIS — R079 Chest pain, unspecified: Secondary | ICD-10-CM | POA: Diagnosis not present

## 2018-08-29 DIAGNOSIS — Z881 Allergy status to other antibiotic agents status: Secondary | ICD-10-CM | POA: Diagnosis not present

## 2018-08-29 DIAGNOSIS — J181 Lobar pneumonia, unspecified organism: Secondary | ICD-10-CM | POA: Diagnosis not present

## 2018-08-29 DIAGNOSIS — I480 Paroxysmal atrial fibrillation: Secondary | ICD-10-CM | POA: Diagnosis present

## 2018-08-29 DIAGNOSIS — F339 Major depressive disorder, recurrent, unspecified: Secondary | ICD-10-CM | POA: Diagnosis not present

## 2018-08-29 DIAGNOSIS — R0789 Other chest pain: Secondary | ICD-10-CM | POA: Diagnosis not present

## 2018-08-29 DIAGNOSIS — Z825 Family history of asthma and other chronic lower respiratory diseases: Secondary | ICD-10-CM | POA: Diagnosis not present

## 2018-08-29 DIAGNOSIS — Z9981 Dependence on supplemental oxygen: Secondary | ICD-10-CM | POA: Diagnosis not present

## 2018-08-29 DIAGNOSIS — I482 Chronic atrial fibrillation, unspecified: Secondary | ICD-10-CM | POA: Diagnosis not present

## 2018-08-29 DIAGNOSIS — Z6841 Body Mass Index (BMI) 40.0 and over, adult: Secondary | ICD-10-CM | POA: Diagnosis not present

## 2018-08-29 DIAGNOSIS — F329 Major depressive disorder, single episode, unspecified: Secondary | ICD-10-CM | POA: Diagnosis present

## 2018-08-29 DIAGNOSIS — J45901 Unspecified asthma with (acute) exacerbation: Principal | ICD-10-CM | POA: Diagnosis present

## 2018-08-29 DIAGNOSIS — R0602 Shortness of breath: Secondary | ICD-10-CM | POA: Diagnosis not present

## 2018-08-29 DIAGNOSIS — J80 Acute respiratory distress syndrome: Secondary | ICD-10-CM | POA: Diagnosis not present

## 2018-08-29 DIAGNOSIS — Z888 Allergy status to other drugs, medicaments and biological substances status: Secondary | ICD-10-CM | POA: Diagnosis not present

## 2018-08-29 DIAGNOSIS — R0689 Other abnormalities of breathing: Secondary | ICD-10-CM | POA: Diagnosis not present

## 2018-08-29 DIAGNOSIS — K219 Gastro-esophageal reflux disease without esophagitis: Secondary | ICD-10-CM | POA: Diagnosis present

## 2018-08-29 DIAGNOSIS — J441 Chronic obstructive pulmonary disease with (acute) exacerbation: Secondary | ICD-10-CM

## 2018-08-29 DIAGNOSIS — I4892 Unspecified atrial flutter: Secondary | ICD-10-CM | POA: Diagnosis not present

## 2018-08-29 DIAGNOSIS — B349 Viral infection, unspecified: Secondary | ICD-10-CM | POA: Diagnosis present

## 2018-08-29 DIAGNOSIS — J449 Chronic obstructive pulmonary disease, unspecified: Secondary | ICD-10-CM | POA: Diagnosis not present

## 2018-08-29 DIAGNOSIS — Z86711 Personal history of pulmonary embolism: Secondary | ICD-10-CM | POA: Diagnosis not present

## 2018-08-29 DIAGNOSIS — J9611 Chronic respiratory failure with hypoxia: Secondary | ICD-10-CM | POA: Diagnosis not present

## 2018-08-29 HISTORY — DX: Acute bronchitis, unspecified: J20.9

## 2018-08-29 HISTORY — DX: Acute and chronic respiratory failure with hypoxia: J96.21

## 2018-08-29 LAB — CBC WITH DIFFERENTIAL/PLATELET
Abs Immature Granulocytes: 0.07 10*3/uL (ref 0.00–0.07)
Basophils Absolute: 0 10*3/uL (ref 0.0–0.1)
Basophils Relative: 0 %
Eosinophils Absolute: 0.2 10*3/uL (ref 0.0–0.5)
Eosinophils Relative: 3 %
HCT: 38.4 % (ref 36.0–46.0)
Hemoglobin: 11.6 g/dL — ABNORMAL LOW (ref 12.0–15.0)
Immature Granulocytes: 1 %
Lymphocytes Relative: 19 %
Lymphs Abs: 1.8 10*3/uL (ref 0.7–4.0)
MCH: 30.7 pg (ref 26.0–34.0)
MCHC: 30.2 g/dL (ref 30.0–36.0)
MCV: 101.6 fL — ABNORMAL HIGH (ref 80.0–100.0)
Monocytes Absolute: 0.5 10*3/uL (ref 0.1–1.0)
Monocytes Relative: 5 %
Neutro Abs: 6.7 10*3/uL (ref 1.7–7.7)
Neutrophils Relative %: 72 %
Platelets: 318 10*3/uL (ref 150–400)
RBC: 3.78 MIL/uL — ABNORMAL LOW (ref 3.87–5.11)
RDW: 17.6 % — ABNORMAL HIGH (ref 11.5–15.5)
WBC: 9.3 10*3/uL (ref 4.0–10.5)
nRBC: 0 % (ref 0.0–0.2)

## 2018-08-29 LAB — CBC
HCT: 37.9 % (ref 36.0–46.0)
Hemoglobin: 11.3 g/dL — ABNORMAL LOW (ref 12.0–15.0)
MCH: 30.5 pg (ref 26.0–34.0)
MCHC: 29.8 g/dL — AB (ref 30.0–36.0)
MCV: 102.4 fL — ABNORMAL HIGH (ref 80.0–100.0)
Platelets: 357 10*3/uL (ref 150–400)
RBC: 3.7 MIL/uL — ABNORMAL LOW (ref 3.87–5.11)
RDW: 17.6 % — AB (ref 11.5–15.5)
WBC: 8.9 10*3/uL (ref 4.0–10.5)
nRBC: 0 % (ref 0.0–0.2)

## 2018-08-29 LAB — BASIC METABOLIC PANEL
Anion gap: 10 (ref 5–15)
BUN: 11 mg/dL (ref 8–23)
CO2: 24 mmol/L (ref 22–32)
Calcium: 8.7 mg/dL — ABNORMAL LOW (ref 8.9–10.3)
Chloride: 104 mmol/L (ref 98–111)
Creatinine, Ser: 0.67 mg/dL (ref 0.44–1.00)
GFR calc Af Amer: >60 mL/min (ref >60)
GFR calc non Af Amer: >60 mL/min (ref >60)
Glucose, Bld: 115 mg/dL — ABNORMAL HIGH (ref 70–99)
Potassium: 4.2 mmol/L (ref 3.5–5.1)
Sodium: 138 mmol/L (ref 135–145)

## 2018-08-29 LAB — CREATININE, SERUM
Creatinine, Ser: 0.89 mg/dL (ref 0.44–1.00)
GFR calc Af Amer: >60 mL/min (ref >60)
GFR calc non Af Amer: >60 mL/min (ref >60)

## 2018-08-29 MED ORDER — SODIUM CHLORIDE 0.9 % IV SOLN: 1.0000 g | Freq: Once | INTRAVENOUS | Status: DC

## 2018-08-29 MED ORDER — PREDNISONE 20 MG PO TABS
40.0000 mg | ORAL_TABLET | Freq: Every day | ORAL | Status: DC
  Administered 2018-08-30: 40 mg via ORAL
  Filled 2018-08-29: qty 2

## 2018-08-29 MED ORDER — GUAIFENESIN-DM 100-10 MG/5ML PO SYRP
5.0000 mL | ORAL_SOLUTION | ORAL | Status: DC | PRN
  Administered 2018-08-29 – 2018-09-04 (×16): 5 mL via ORAL
  Filled 2018-08-29 (×16): qty 10

## 2018-08-29 MED ORDER — CYCLOSPORINE 0.05 % OP EMUL
1.0000 [drp] | Freq: Two times a day (BID) | OPHTHALMIC | Status: DC
  Administered 2018-08-29 – 2018-09-07 (×18): 1 [drp] via OPHTHALMIC
  Filled 2018-08-29 (×18): qty 30

## 2018-08-29 MED ORDER — METOPROLOL TARTRATE 25 MG PO TABS
25.0000 mg | ORAL_TABLET | Freq: Two times a day (BID) | ORAL | Status: DC
  Administered 2018-08-29 – 2018-09-07 (×18): 25 mg via ORAL
  Filled 2018-08-29 (×18): qty 1

## 2018-08-29 MED ORDER — ACETAMINOPHEN 325 MG PO TABS
650.0000 mg | ORAL_TABLET | Freq: Four times a day (QID) | ORAL | Status: DC | PRN
  Administered 2018-08-29 – 2018-09-07 (×11): 650 mg via ORAL
  Filled 2018-08-29 (×10): qty 2

## 2018-08-29 MED ORDER — RIVAROXABAN 20 MG PO TABS
20.0000 mg | ORAL_TABLET | Freq: Every day | ORAL | Status: DC
  Administered 2018-08-29 – 2018-09-06 (×9): 20 mg via ORAL
  Filled 2018-08-29 (×10): qty 1

## 2018-08-29 MED ORDER — IPRATROPIUM BROMIDE 0.02 % IN SOLN
0.5000 mg | Freq: Once | RESPIRATORY_TRACT | Status: AC
  Administered 2018-08-29: 0.5 mg via RESPIRATORY_TRACT
  Filled 2018-08-29: qty 2.5

## 2018-08-29 MED ORDER — DILTIAZEM HCL ER COATED BEADS 120 MG PO CP24
120.0000 mg | ORAL_CAPSULE | Freq: Every day | ORAL | Status: DC
  Administered 2018-08-30 – 2018-09-07 (×9): 120 mg via ORAL
  Filled 2018-08-29 (×9): qty 1

## 2018-08-29 MED ORDER — PANTOPRAZOLE SODIUM 40 MG PO TBEC
40.0000 mg | DELAYED_RELEASE_TABLET | Freq: Two times a day (BID) | ORAL | Status: DC
  Administered 2018-08-29 – 2018-09-07 (×18): 40 mg via ORAL
  Filled 2018-08-29 (×18): qty 1

## 2018-08-29 MED ORDER — FLUTICASONE PROPIONATE 50 MCG/ACT NA SUSP
1.0000 | Freq: Every day | NASAL | Status: DC
  Administered 2018-08-29 – 2018-09-04 (×7): 1 via NASAL
  Filled 2018-08-29 (×2): qty 16

## 2018-08-29 MED ORDER — ALBUTEROL (5 MG/ML) CONTINUOUS INHALATION SOLN
15.0000 mg/h | INHALATION_SOLUTION | RESPIRATORY_TRACT | Status: DC
  Administered 2018-08-29: 15 mg/h via RESPIRATORY_TRACT
  Filled 2018-08-29: qty 20

## 2018-08-29 MED ORDER — ALBUTEROL SULFATE (2.5 MG/3ML) 0.083% IN NEBU: 2.5000 mg | INHALATION_SOLUTION | RESPIRATORY_TRACT | Status: DC | PRN

## 2018-08-29 MED ORDER — HYDROXYZINE HCL 25 MG PO TABS
25.0000 mg | ORAL_TABLET | Freq: Two times a day (BID) | ORAL | Status: DC | PRN
  Administered 2018-08-29 – 2018-09-07 (×4): 25 mg via ORAL
  Filled 2018-08-29 (×4): qty 1

## 2018-08-29 MED ORDER — AZELASTINE HCL 0.1 % NA SOLN
1.0000 | Freq: Two times a day (BID) | NASAL | Status: DC
  Filled 2018-08-29: qty 30

## 2018-08-29 MED ORDER — MAGNESIUM OXIDE 400 (241.3 MG) MG PO TABS
400.0000 mg | ORAL_TABLET | Freq: Two times a day (BID) | ORAL | Status: DC
  Administered 2018-08-29 – 2018-09-07 (×18): 400 mg via ORAL
  Filled 2018-08-29 (×18): qty 1

## 2018-08-29 MED ORDER — POTASSIUM CHLORIDE CRYS ER 20 MEQ PO TBCR
40.0000 meq | EXTENDED_RELEASE_TABLET | Freq: Every day | ORAL | Status: DC
  Administered 2018-08-30 – 2018-09-07 (×9): 40 meq via ORAL
  Filled 2018-08-29 (×9): qty 2

## 2018-08-29 MED ORDER — VENLAFAXINE HCL ER 150 MG PO CP24
150.0000 mg | ORAL_CAPSULE | Freq: Every day | ORAL | Status: DC
  Administered 2018-08-30 – 2018-09-07 (×9): 150 mg via ORAL
  Filled 2018-08-29 (×9): qty 1

## 2018-08-29 MED ORDER — IPRATROPIUM-ALBUTEROL 0.5-2.5 (3) MG/3ML IN SOLN
3.0000 mL | Freq: Four times a day (QID) | RESPIRATORY_TRACT | Status: DC
  Administered 2018-08-29 – 2018-09-04 (×23): 3 mL via RESPIRATORY_TRACT
  Filled 2018-08-29 (×24): qty 3

## 2018-08-29 MED ORDER — ZOLPIDEM TARTRATE 5 MG PO TABS
5.0000 mg | ORAL_TABLET | Freq: Every evening | ORAL | Status: DC | PRN
  Administered 2018-08-30 – 2018-09-07 (×8): 5 mg via ORAL
  Filled 2018-08-29 (×8): qty 1

## 2018-08-29 MED ORDER — ONDANSETRON HCL 4 MG/2ML IJ SOLN: 4.0000 mg | Freq: Four times a day (QID) | INTRAMUSCULAR | Status: DC | PRN

## 2018-08-29 MED ORDER — WITCH HAZEL-GLYCERIN EX PADS: MEDICATED_PAD | CUTANEOUS | Status: DC | PRN

## 2018-08-29 MED ORDER — ACETAMINOPHEN 650 MG RE SUPP: 650.0000 mg | Freq: Four times a day (QID) | RECTAL | Status: DC | PRN

## 2018-08-29 MED ORDER — ONDANSETRON HCL 4 MG PO TABS: 4.0000 mg | ORAL_TABLET | Freq: Four times a day (QID) | ORAL | Status: DC | PRN

## 2018-08-29 NOTE — H&P (Signed)
[name] White is an 63 y.o. female.   Chief Complaint: Shortness of breath, cough with purulent sputum, chills and body aches.  HPI: The patient is a 63 yr old woman who was discharged from this hospital to a rehab facility on 07/03/2018 for asthma exacerbation. She was discharged on 40 mg of prednisone daily. The patient states that she was doing well until last night when she started coughing. She states that she then had body aches overnight and this morning felt very short of breath. She was sent to the ED via EMS. The patient states that yesterday she got a roommate that had a cough, and that she had noticed staff around the facility coughing in the halls as well. She denies fevers or chills. She is on 2 liters of oxygen chronically. She is requiring 3L of O2 to maintain oxygen saturations in the 90's in the ED. She denies sputum production, fevers, or chest pain. She denies nausea, vomiting, hematemesis, hematochezia, or melena. She denies fevers, but states that she has had chills and myalgias.  The patient carries a past medical history significant for asthma, chronic respiratory failure with 2L O2 chronically at home, chronic back pain, depression, GERD, hypertension, hyperthyroidism, migraine, osteoarthritis, paroxysmal atrial fibrillation, and history of pulmonary embolus for which she is on one of the novel anticoagulants.   Upon arrival in the ED, the patient had a temperature of 98.9, RR of 25, HR of 108, and blood pressure of 163.109. She was saturating 97% on 3 L.   Her sodium was 138, potassium was 4.2. Chloride was 104, CO2 wasa 24, and BUN was 11. WBC was 9.3, hemoglobin was 11.6, hematocrit was 38.4, and platelets were 318. CXR demonstrated poor inspiratory effort, right lower lobe atelectasis with a moderate left basilar pneumonia vs patchy atelectasis.  The hospitalists have been consulted to admit the patient for further evaluation and treatment.  Past Medical History:   Diagnosis Date  . Anxiety   . Asthma   . Chronic lower back pain   . Depression   . Family history of anesthesia complication    "daughter; causes her to pass out afterwards"  . GERD (gastroesophageal reflux disease)   . Hypertension   . Hyperthyroidism   . Migraine    "monthly" (12/28/2013)  . Osteoarthritis    "both knees; back of my neck; right pelvic bone" (12/28/2013)  . Paroxysmal A-fib ([place])   . Pulmonary embolism ([place]) 12/28/2013   "2 clots in each lung"    Past Surgical History:  Procedure Laterality Date  . ABDOMINAL HYSTERECTOMY    . APPENDECTOMY    . BREAST CYST EXCISION Right   . DILATION AND CURETTAGE OF UTERUS    . ELECTROPHYSIOLOGIC STUDY N/A 05/27/2015   Procedure: A-Flutter;  Surgeon: Evans Lance, MD;  Location: [facility];  Service: Cardiovascular;  Laterality: N/A;  . EXCISIONAL HEMORRHOIDECTOMY    . NASAL SINUS SURGERY  2007  . THYROIDECTOMY, PARTIAL Right 2005  . TUBAL LIGATION    . WISDOM TOOTH EXTRACTION      Family History  Problem Relation Age of Onset  . Osteoarthritis Mother   . Asthma Mother   . Heart failure Mother   . Breast cancer Daughter    Social History:  reports that she quit smoking about 19 years ago. Her smoking use included cigarettes. She has a 5.00 pack-year smoking history. She has never used smokeless tobacco. She reports previous alcohol use. She reports that she does  not use drugs. (Not in a hospital admission)   Allergies:  Allergies  Allergen Reactions  . Caffeine Other (See Comments)    Migraine  . Vicodin [Hydrocodone-Acetaminophen] Nausea And Vomiting and Other (See Comments)    Headache   . Hydralazine Hcl     Other reaction(s): Other (See Comments) AKI leading to rhabdo and electrolyte abnormalities  . Hydrocodone-Acetaminophen Nausea And Vomiting  . Ciprofloxacin Hives and Rash  . Erythromycin Hives and Rash  . Lisinopril Cough    Other reaction(s): Cough  . Oxycodone Nausea And Vomiting     headache Other reaction(s): Vomiting  . Sulfamethoxazole-Trimethoprim Rash    Pertinent items are noted in HPI.   General appearance: alert, cooperative, appears stated age and morbidly obese Head: Normocephalic, without obvious abnormality, atraumatic Eyes: conjunctivae/corneas clear. PERRL, EOM's intact. Fundi benign., mild coryza Throat: lips, mucosa, and tongue normal; teeth and gums normal Neck: no adenopathy, no carotid bruit, no JVD, supple, symmetrical, trachea midline and thyroid not enlarged, symmetric, no tenderness/mass/nodules Resp: No increased work of breathing, but severe coughing with conversation or deep respiration. Positve for scattered rhonchi and wheezes. No rales. No tactile fremitus. Chest wall: no tenderness Cardio: regular rate and rhythm, S1, S2 normal, no murmur, click, rub or gallop GI: Abdomen is obeses. Bowel sounds are distant. It is non-tender, non-distended. I am unable to evaluate the abdomen for organomegaly, masses, or hernias due to the patient's body habitus., Extremities: extremities normal, atraumatic, no cyanosis or edema Pulses: 2+ and symmetric Skin: Skin color, texture, turgor normal. No rashes or lesions Lymph nodes: Cervical, supraclavicular, and axillary nodes normal. Neurologic: Alert and oriented X 3, normal strength and tone. Normal symmetric reflexes. Normal coordination and gait  Results for orders placed or performed during the hospital encounter of 08/29/18 (from the past 48 hour(s))  CBC with Differential     Status: Abnormal   Collection Time: 08/29/18  1:03 PM  Result Value Ref Range   WBC 9.3 4.0 - 10.5 K/uL   RBC 3.78 (L) 3.87 - 5.11 MIL/uL   Hemoglobin 11.6 (L) 12.0 - 15.0 g/dL   HCT 38.4 36.0 - 46.0 %   MCV 101.6 (H) 80.0 - 100.0 fL   MCH 30.7 26.0 - 34.0 pg   MCHC 30.2 30.0 - 36.0 g/dL   RDW 17.6 (H) 11.5 - 15.5 %   Platelets 318 150 - 400 K/uL   nRBC 0.0 0.0 - 0.2 %   Neutrophils Relative % 72 %   Neutro Abs 6.7 1.7  - 7.7 K/uL   Lymphocytes Relative 19 %   Lymphs Abs 1.8 0.7 - 4.0 K/uL   Monocytes Relative 5 %   Monocytes Absolute 0.5 0.1 - 1.0 K/uL   Eosinophils Relative 3 %   Eosinophils Absolute 0.2 0.0 - 0.5 K/uL   Basophils Relative 0 %   Basophils Absolute 0.0 0.0 - 0.1 K/uL   Immature Granulocytes 1 %   Abs Immature Granulocytes 0.07 0.00 - 0.07 K/uL    Comment: Performed at [facility], [place] [address] [place], [place] [address]  Basic metabolic panel     Status: Abnormal   Collection Time: 08/29/18  1:03 PM  Result Value Ref Range   Sodium 138 135 - 145 mmol/L   Potassium 4.2 3.5 - 5.1 mmol/L   Chloride 104 98 - 111 mmol/L   CO2 24 22 - 32 mmol/L   Glucose, Bld 115 (H) 70 - 99 mg/dL   BUN 11 8 -  23 mg/dL   Creatinine, Ser 0.67 0.44 - 1.00 mg/dL   Calcium 8.7 (L) 8.9 - 10.3 mg/dL   GFR calc non Af Amer >60 >60 mL/min   GFR calc Af Amer >60 >60 mL/min   Anion gap 10 5 - 15    Comment: Performed at [facility], [place] [address] [place], [place] [address]  CBC     Status: Abnormal   Collection Time: 08/29/18  4:21 PM  Result Value Ref Range   WBC 8.9 4.0 - 10.5 K/uL   RBC 3.70 (L) 3.87 - 5.11 MIL/uL   Hemoglobin 11.3 (L) 12.0 - 15.0 g/dL   HCT 37.9 36.0 - 46.0 %   MCV 102.4 (H) 80.0 - 100.0 fL   MCH 30.5 26.0 - 34.0 pg   MCHC 29.8 (L) 30.0 - 36.0 g/dL   RDW 17.6 (H) 11.5 - 15.5 %   Platelets 357 150 - 400 K/uL   nRBC 0.0 0.0 - 0.2 %    Comment: Performed at [facility], [place] [address] [place], [place] [address]  Creatinine, serum     Status: None   Collection Time: 08/29/18  4:21 PM  Result Value Ref Range   Creatinine, Ser 0.89 0.44 - 1.00 mg/dL   GFR calc non Af Amer >60 >60 mL/min   GFR calc Af Amer >60 >60 mL/min    Comment: Performed at [facility], [place] [address], [place], [place] [address]   @[email or web address] @  Blood pressure (!) 148/119, pulse (!) 104, temperature 98.9 F (37.2 C),  temperature source Oral, resp. rate 15, height 5\' 5"  (1.651 m), weight 136.1 kg, SpO2 98 %.    Assessment/Plan Problem  Copd With Acute Exacerbation (Hcc)  Acute Bronchitis  Acute On Chronic Respiratory Failure With Hypoxia (Hcc)  Viral Illness  Mdd (Major Depressive Disorder), Recurrent Episode, Severe (Hcc)  Atrial fibrillation, chronic  Chronic Anticoagulation  History of Pulmonary Embolus ([place])  Major Depressive Disorder  Sob (Shortness of Breath)  Morbid Obesity (Hcc)  GASTROESOPHAGEAL REFLUX, NO ESOPHAGITIS   The patient will be admitted to a telemetry bed. She will receive steroids, nebulizer treatments and IV azithromycin. She will be continued on her home medications as possible. O2 will be weaned down as possible. Flu screen will be obtained.   I have seen and examined this patient myself. I have viewed and interpreted the CXR myself. I have spent 72 minutes in the patient's evaluation and admission.  Laurisa Sahakian 08/29/2018, 6:18 PM

## 2018-08-29 NOTE — ED Notes (Signed)
Bed: UD31 Expected date:  Expected time:  Means of arrival:  Comments: EMS 63 yo female

## 2018-08-29 NOTE — ED Notes (Signed)
Attempted to call 5E for report

## 2018-08-29 NOTE — ED Notes (Signed)
ED TO INPATIENT HANDOFF REPORT  ED Nurse Name and Phone #: Maddie RN  S Name/Age/Gender [name] White 63 y.o. female Room/Bed: WA13/WA13  Code Status   Code Status: Full Code  Home/SNF/Other Home {Patient oriented to: x4 Is this baseline? Yes   Triage Complete: Triage complete  Chief Complaint SOB  Triage Note Per EMS: Pt from [facility].  Pt c/o of SOB since 10:45 am. Staff did not help pt.  Pt SOB on arrival.  Pt has received 15 mg albuterol, 1 mg Atrovent, and 125 solu-medrol.    Allergies Allergies  Allergen Reactions  . Caffeine Other (See Comments)    Migraine  . Vicodin [Hydrocodone-Acetaminophen] Nausea And Vomiting and Other (See Comments)    Headache   . Hydralazine Hcl     Other reaction(s): Other (See Comments) AKI leading to rhabdo and electrolyte abnormalities  . Hydrocodone-Acetaminophen Nausea And Vomiting  . Ciprofloxacin Hives and Rash  . Erythromycin Hives and Rash  . Lisinopril Cough    Other reaction(s): Cough  . Oxycodone Nausea And Vomiting    headache Other reaction(s): Vomiting  . Sulfamethoxazole-Trimethoprim Rash    Level of Care/Admitting Diagnosis ED Disposition    ED Disposition Condition Comment   Admit  Hospital Area: [facility] [[id number]]  Level of Care: Med-Surg [16]  Diagnosis: COPD with acute exacerbation [facility]) [[id number]]  Admitting Physician: Karie Kirks [phone]  Attending Physician: Benny Lennert, AVA [[id number]]  PT Class (Do Not Modify): Observation [104]  PT Acc Code (Do Not Modify): Observation [10022]       B Medical/Surgery History Past Medical History:  Diagnosis Date  . Anxiety   . Asthma   . Chronic lower back pain   . Depression   . Family history of anesthesia complication    "daughter; causes her to pass out afterwards"  . GERD (gastroesophageal reflux disease)   . Hypertension   . Hyperthyroidism   . Migraine    "monthly" (12/28/2013)  . Osteoarthritis    "both  knees; back of my neck; right pelvic bone" (12/28/2013)  . Paroxysmal A-fib ([place])   . Pulmonary embolism ([place]) 12/28/2013   "2 clots in each lung"   Past Surgical History:  Procedure Laterality Date  . ABDOMINAL HYSTERECTOMY    . APPENDECTOMY    . BREAST CYST EXCISION Right   . DILATION AND CURETTAGE OF UTERUS    . ELECTROPHYSIOLOGIC STUDY N/A 05/27/2015   Procedure: A-Flutter;  Surgeon: Evans Lance, MD;  Location: [facility];  Service: Cardiovascular;  Laterality: N/A;  . EXCISIONAL HEMORRHOIDECTOMY    . NASAL SINUS SURGERY  2007  . THYROIDECTOMY, PARTIAL Right 2005  . TUBAL LIGATION    . WISDOM TOOTH EXTRACTION       A IV Location/Drains/Wounds Patient Lines/Drains/Airways Status   Active Line/Drains/Airways    Name:   Placement date:   Placement time:   Site:   Days:   Peripheral IV 08/29/18 Right Antecubital   08/29/18    -    Antecubital   less than 1   Midline Single Lumen 08/11/18 Midline Left Cephalic 8 cm 0 cm   29/52/84    1324    Cephalic   18          Intake/Output Last 24 hours No intake or output data in the 24 hours ending 08/29/18 1811  Labs/Imaging Results for orders placed or performed during the hospital encounter of 08/29/18 (from the past 48 hour(s))  CBC  with Differential     Status: Abnormal   Collection Time: 08/29/18  1:03 PM  Result Value Ref Range   WBC 9.3 4.0 - 10.5 K/uL   RBC 3.78 (L) 3.87 - 5.11 MIL/uL   Hemoglobin 11.6 (L) 12.0 - 15.0 g/dL   HCT 38.4 36.0 - 46.0 %   MCV 101.6 (H) 80.0 - 100.0 fL   MCH 30.7 26.0 - 34.0 pg   MCHC 30.2 30.0 - 36.0 g/dL   RDW 17.6 (H) 11.5 - 15.5 %   Platelets 318 150 - 400 K/uL   nRBC 0.0 0.0 - 0.2 %   Neutrophils Relative % 72 %   Neutro Abs 6.7 1.7 - 7.7 K/uL   Lymphocytes Relative 19 %   Lymphs Abs 1.8 0.7 - 4.0 K/uL   Monocytes Relative 5 %   Monocytes Absolute 0.5 0.1 - 1.0 K/uL   Eosinophils Relative 3 %   Eosinophils Absolute 0.2 0.0 - 0.5 K/uL   Basophils Relative 0 %   Basophils  Absolute 0.0 0.0 - 0.1 K/uL   Immature Granulocytes 1 %   Abs Immature Granulocytes 0.07 0.00 - 0.07 K/uL    Comment: Performed at [facility], [place] [address] [place], [place] [address]  Basic metabolic panel     Status: Abnormal   Collection Time: 08/29/18  1:03 PM  Result Value Ref Range   Sodium 138 135 - 145 mmol/L   Potassium 4.2 3.5 - 5.1 mmol/L   Chloride 104 98 - 111 mmol/L   CO2 24 22 - 32 mmol/L   Glucose, Bld 115 (H) 70 - 99 mg/dL   BUN 11 8 - 23 mg/dL   Creatinine, Ser 0.67 0.44 - 1.00 mg/dL   Calcium 8.7 (L) 8.9 - 10.3 mg/dL   GFR calc non Af Amer >60 >60 mL/min   GFR calc Af Amer >60 >60 mL/min   Anion gap 10 5 - 15    Comment: Performed at [facility], [place] [address] [place], [place] [address]  CBC     Status: Abnormal   Collection Time: 08/29/18  4:21 PM  Result Value Ref Range   WBC 8.9 4.0 - 10.5 K/uL   RBC 3.70 (L) 3.87 - 5.11 MIL/uL   Hemoglobin 11.3 (L) 12.0 - 15.0 g/dL   HCT 37.9 36.0 - 46.0 %   MCV 102.4 (H) 80.0 - 100.0 fL   MCH 30.5 26.0 - 34.0 pg   MCHC 29.8 (L) 30.0 - 36.0 g/dL   RDW 17.6 (H) 11.5 - 15.5 %   Platelets 357 150 - 400 K/uL   nRBC 0.0 0.0 - 0.2 %    Comment: Performed at [facility], [place] [address] [place],  [address]   Dg Chest Portable 1 View  Result Date: 08/29/2018 CLINICAL DATA:  Shortness of breath since 10:45 a.m. today. EXAM: PORTABLE CHEST 1 VIEW COMPARISON:  08/07/2018. FINDINGS: Poor inspiration. The most inferior portion of the left lung base is not included. The right hemidiaphragm remains mildly elevated. Mild right inferior perihilar atelectasis. Patchy airspace opacity at the left lung base. Stable borderline enlarged cardiac silhouette and tortuous and partially calcified thoracic aorta. Mild to moderate bilateral AC joint degenerative spur formation. IMPRESSION: Poor inspiration with mild right inferior perihilar atelectasis and moderate left basilar  pneumonia or patchy atelectasis. Electronically Signed   By: Claudie Revering M.D.   On: 08/29/2018 14:09    Pending Labs Unresulted Labs (From admission, onward)  Start     Ordered   09/05/18 0500  Creatinine, serum  (enoxaparin (LOVENOX)    CrCl >/= 30 ml/min)  Weekly,   R    Comments:  while on enoxaparin therapy    08/29/18 1646   08/30/18 0932  Basic metabolic panel  Tomorrow morning,   R     08/29/18 1646   08/30/18 0500  CBC  Tomorrow morning,   R     08/29/18 1646   08/29/18 1626  Creatinine, serum  (enoxaparin (LOVENOX)    CrCl >/= 30 ml/min)  Once,   R    Comments:  Baseline for enoxaparin therapy IF NOT ALREADY DRAWN.    08/29/18 1646   08/29/18 1621  HIV antibody  Once,   R     08/29/18 1646          Vitals/Pain Today's Vitals   08/29/18 1500 08/29/18 1728 08/29/18 1729 08/29/18 1801  BP: (!) 154/99 (!) 148/119  (!) 148/119  Pulse: (!) 111 (!) 106 (!) 105 (!) 104  Resp: 15  (!) 22 15  Temp:      TempSrc:      SpO2: 96% 100% 99% 98%  Weight:      Height:        Isolation Precautions No active isolations  Medications Medications  acetaminophen (TYLENOL) tablet 650 mg (has no administration in time range)    Or  acetaminophen (TYLENOL) suppository 650 mg (has no administration in time range)  zolpidem (AMBIEN) tablet 5 mg (has no administration in time range)  ondansetron (ZOFRAN) tablet 4 mg (has no administration in time range)    Or  ondansetron (ZOFRAN) injection 4 mg (has no administration in time range)  ipratropium-albuterol (DUONEB) 0.5-2.5 (3) MG/3ML nebulizer solution 3 mL (has no administration in time range)  albuterol (PROVENTIL) (2.5 MG/3ML) 0.083% nebulizer solution 2.5 mg (has no administration in time range)  predniSONE (DELTASONE) tablet 40 mg (has no administration in time range)  azelastine (ASTELIN) 0.1 % nasal spray 1 spray (has no administration in time range)  cycloSPORINE (RESTASIS) 0.05 % ophthalmic emulsion 1 drop (has no  administration in time range)  diltiazem (CARDIZEM CD) 24 hr capsule 120 mg (has no administration in time range)  fluticasone (FLONASE) 50 MCG/ACT nasal spray 1 spray (has no administration in time range)  hydrOXYzine (ATARAX/VISTARIL) tablet 25 mg (has no administration in time range)  magnesium oxide (MAG-OX) tablet 400 mg (has no administration in time range)  metoprolol tartrate (LOPRESSOR) tablet 25 mg (has no administration in time range)  pantoprazole (PROTONIX) EC tablet 40 mg (has no administration in time range)  potassium chloride SA (K-DUR,KLOR-CON) CR tablet 40 mEq (has no administration in time range)  rivaroxaban (XARELTO) tablet 20 mg (has no administration in time range)  venlafaxine XR (EFFEXOR-XR) 24 hr capsule 150 mg (has no administration in time range)  witch hazel-glycerin (TUCKS) pad (has no administration in time range)  ipratropium (ATROVENT) nebulizer solution 0.5 mg (0.5 mg Nebulization Given 08/29/18 1306)    Mobility walks with device High fall risk   Focused Assessments Pulmonary Assessment Handoff:  Lung sounds: Bilateral Breath Sounds: Diminished, Expiratory wheezes O2 Device: Room Air O2 Flow Rate (L/min): 3 L/min      R Recommendations: See Admitting Provider Note  Report given to: 5 E charge RN  Additional Notes: none

## 2018-08-29 NOTE — ED Notes (Addendum)
No purple man posted.  Attempted to call [facility] to give report

## 2018-08-29 NOTE — ED Triage Notes (Signed)
Per EMS: Pt from [facility].  Pt c/o of SOB since 10:45 am. Staff did not help pt.  Pt SOB on arrival.  Pt has received 15 mg albuterol, 1 mg Atrovent, and 125 solu-medrol.

## 2018-08-29 NOTE — ED Provider Notes (Signed)
[facility] DEPT Provider Note   CSN: [id number] Arrival date & time: 08/29/18  1213    History   Chief Complaint Chief Complaint  Patient presents with  . Shortness of Breath  . COPD    HPI [name] White is a 63 y.o. female.     HPI   63 year old female with shortness of breath.  History of underlying COPD with a baseline oxygen requirement of 2 L.  She is coming from [facility].  She states that she began feeling short of breath around 10:45 AM today.  She states that she called staff for help and requested a nebulizer treatment but none was provided.  She states that she ended up calling 911 herself.  She received 15 mg of albuterol, 1 mg of Atrovent and 125 mg of Solu-Medrol prior to arrival.  She is currently feeling a little bit better.  On review of systems, she endorses some pleuritic right-sided chest pain.  No fevers or chills.  No unusual leg pain or swelling.  She has a chronic cough.  Past Medical History:  Diagnosis Date  . Anxiety   . Asthma   . Chronic lower back pain   . Depression   . Family history of anesthesia complication    "daughter; causes her to pass out afterwards"  . GERD (gastroesophageal reflux disease)   . Hypertension   . Hyperthyroidism   . Migraine    "monthly" (12/28/2013)  . Osteoarthritis    "both knees; back of my neck; right pelvic bone" (12/28/2013)  . Paroxysmal A-fib ([place])   . Pulmonary embolism ([place]) 12/28/2013   "2 clots in each lung"    Patient Active Problem List   Diagnosis Date Noted  . Sleep apnea 07/05/2018  . Chronic respiratory failure with hypoxia ([place]) 07/05/2018  . Osteoarthritis 03/02/2018  . Viral illness 02/13/2018  . Nausea 02/13/2018  . Left medial tibial stress syndrome 12/26/2017  . Hematoma 11/27/2017  . Severe episode of recurrent major depressive disorder, without psychotic features ([place])   . MDD (major depressive disorder), recurrent episode, severe  ([place]) 09/07/2015  . Atrial flutter ([place]) 07/29/2015  . History of atrial flutter 06/26/2015  . Chest pain 03/22/2015  . Asthma 11/12/2014  . History of hypertension 11/05/2014  . Atrial fibrillation, chronic 11/03/2014  . Chronic anticoagulation 11/03/2014  . History of pulmonary embolus ([place]) 11/03/2014  . Non-traumatic rhabdomyolysis 11/03/2014  . Major depressive disorder 11/03/2014  . SOB (shortness of breath)   . MDD (major depressive disorder), recurrent severe, without psychosis ([place]) 03/05/2014  . Pulmonary embolism ([place]) 12/27/2013  . Vocal cord dysfunction 11/12/2013  . Mixed headache 01/06/2012  . OVERACTIVE BLADDER 04/18/2008  . Osteoarthritis of both knees 04/18/2008  . POLYARTHRITIS 09/14/2007  . Morbid obesity ([place]) 08/24/2006  . RESTLESS LEGS SYNDROME 08/24/2006  . HYPERTENSION, BENIGN SYSTEMIC 08/24/2006  . RHINITIS, ALLERGIC 08/24/2006  . GASTROESOPHAGEAL REFLUX, NO ESOPHAGITIS 08/24/2006    Past Surgical History:  Procedure Laterality Date  . ABDOMINAL HYSTERECTOMY    . APPENDECTOMY    . BREAST CYST EXCISION Right   . DILATION AND CURETTAGE OF UTERUS    . ELECTROPHYSIOLOGIC STUDY N/A 05/27/2015   Procedure: A-Flutter;  Surgeon: Evans Lance, MD;  Location: [facility];  Service: Cardiovascular;  Laterality: N/A;  . EXCISIONAL HEMORRHOIDECTOMY    . NASAL SINUS SURGERY  2007  . THYROIDECTOMY, PARTIAL Right 2005  . TUBAL LIGATION    . WISDOM TOOTH EXTRACTION  OB History   No obstetric history on file.      Home Medications    Prior to Admission medications   Medication Sig Start Date End Date Taking? Authorizing Provider  acetaminophen (TYLENOL) 650 MG CR tablet Take 1,300 mg by mouth daily as needed for pain.   Yes [provider]  albuterol (PROVENTIL HFA;VENTOLIN HFA) 108 (90 Base) MCG/ACT inhaler Inhale 2 puffs into the lungs every 6 (six) hours as needed for wheezing or shortness of breath. 11/29/17  Yes Lockamy, Timothy, DO   albuterol (PROVENTIL) (2.5 MG/3ML) 0.083% nebulizer solution Take 3 mLs (2.5 mg total) by nebulization every 6 (six) hours as needed for wheezing or shortness of breath. 07/09/18  Yes Collene Gobble, MD  azelastine (OPTIVAR) 0.05 % ophthalmic solution Place 1 drop into both eyes daily. 08/02/18  Yes [provider]  budesonide-formoterol (SYMBICORT) 160-4.5 MCG/ACT inhaler Inhale 2 puffs into the lungs 2 (two) times daily. 07/03/18  Yes Nita Sells, MD  cycloSPORINE (RESTASIS) 0.05 % ophthalmic emulsion Place 1 drop into both eyes 2 (two) times daily.   Yes [provider]  diclofenac sodium (VOLTAREN) 1 % GEL APPLY 2 GRAMS EXTERNALLY TO THE AFFECTED AREA FOUR TIMES DAILY Patient taking differently: Apply 2 g topically 4 (four) times daily.  07/31/18  Yes [name], MD  diltiazem (CARDIZEM CD) 120 MG 24 hr capsule Take 1 capsule (120 mg total) by mouth daily. Please keep upcoming appt in January before anymore refills. Final Attempt Patient taking differently: Take 120 mg by mouth daily.  07/04/18  Yes Seiler, Luetta Nutting K, NP  flecainide (TAMBOCOR) 100 MG tablet Take 1 tablet (100 mg total) by mouth 2 (two) times daily. Please keep upcoming appt in January before anymore refills. Final Attempt Patient taking differently: Take 100 mg by mouth 2 (two) times daily.  07/04/18  Yes Seiler, Amber K, NP  fluticasone (FLONASE) 50 MCG/ACT nasal spray Place 1 spray into both nostrils daily. 07/03/18 08/29/18 Yes Nita Sells, MD  hydrocortisone (ANUSOL-HC) 25 MG suppository Place 1 suppository (25 mg total) rectally 2 (two) times daily. 08/20/18  Yes [place] Eric J, DO  hydrOXYzine (ATARAX/VISTARIL) 25 MG tablet Take 25 mg by mouth 2 (two) times daily as needed for anxiety.   Yes [provider]  loratadine (CLARITIN) 10 MG tablet Take 10 mg by mouth daily.   Yes [provider]  magnesium oxide (MAG-OX) 400 (241.3 Mg) MG tablet Take 1 tablet (400 mg total) by mouth 2  (two) times daily. 08/20/18  Yes [place] Eric J, DO  metoprolol tartrate (LOPRESSOR) 25 MG tablet Take 1 tablet (25 mg total) by mouth 2 (two) times daily. 08/20/18  Yes [place] Eric J, DO  pantoprazole (PROTONIX) 40 MG tablet Take 1 tablet (40 mg total) by mouth 2 (two) times daily. 08/20/18  Yes [place] Eric J, DO  potassium chloride SA (K-DUR,KLOR-CON) 20 MEQ tablet Take 2 tablets (40 mEq total) by mouth 2 (two) times daily. 08/20/18  Yes [place] Eric J, DO  rivaroxaban (XARELTO) 20 MG TABS tablet TAKE 1 TABLET(20 MG) BY MOUTH DAILY AFTER SUPPER 07/04/18  Yes Seiler, [employer] K, NP  venlafaxine XR (EFFEXOR-XR) 150 MG 24 hr capsule Take 150 mg by mouth daily with breakfast.   Yes [provider]  witch hazel-glycerin (TUCKS) pad Apply topically as needed for itching. 08/20/18  Yes [place] Donnamarie Poag, DO    Family History Family History  Problem Relation Age of Onset  . Osteoarthritis Mother   .  Asthma Mother   . Heart failure Mother   . Breast cancer Daughter     Social History Social History   Tobacco Use  . Smoking status: Former Smoker    Packs/day: 0.25    Years: 20.00    Pack years: 5.00    Types: Cigarettes    Last attempt to quit: 07/17/1999    Years since quitting: 19.1  . Smokeless tobacco: Never Used  Substance Use Topics  . Alcohol use: Not Currently    Comment: "drank some in my 30's"  . Drug use: No     Allergies   Caffeine; Vicodin [hydrocodone-acetaminophen]; Hydralazine hcl; Hydrocodone-acetaminophen; Ciprofloxacin; Erythromycin; Lisinopril; Oxycodone; and Sulfamethoxazole-trimethoprim   Review of Systems Review of Systems  All systems reviewed and negative, other than as noted in HPI.  Physical Exam Updated Vital Signs BP (!) 154/99 (BP Location: Right Arm)   Pulse (!) 111   Temp 98.9 F (37.2 C) (Oral)   Resp 15   Ht 5\' 5"  (1.651 m)   Wt 136.1 kg   SpO2 96%   BMI 49.92 kg/m   Physical Exam Vitals signs and nursing note reviewed.    Constitutional:      General: She is not in acute distress.    Appearance: She is well-developed. She is obese.  HENT:     Head: Normocephalic and atraumatic.  Eyes:     General:        Right eye: No discharge.        Left eye: No discharge.     Conjunctiva/sclera: Conjunctivae normal.  Neck:     Musculoskeletal: Neck supple.  Cardiovascular:     Rate and Rhythm: Regular rhythm. Tachycardia present.     Heart sounds: Normal heart sounds. No murmur. No friction rub. No gallop.   Pulmonary:     Effort: Respiratory distress present.     Breath sounds: Wheezing present.  Abdominal:     General: There is no distension.     Palpations: Abdomen is soft.     Tenderness: There is no abdominal tenderness.  Musculoskeletal:        General: No tenderness.  Skin:    General: Skin is warm and dry.  Neurological:     Mental Status: She is alert.  Psychiatric:        Behavior: Behavior normal.        Thought Content: Thought content normal.      ED Treatments / Results  Labs (all labs ordered are listed, but only abnormal results are displayed) Labs Reviewed  CBC WITH DIFFERENTIAL/PLATELET - Abnormal; Notable for the following components:      Result Value   RBC 3.78 (*)    Hemoglobin 11.6 (*)    MCV 101.6 (*)    RDW 17.6 (*)    All other components within normal limits  BASIC METABOLIC PANEL - Abnormal; Notable for the following components:   Glucose, Bld 115 (*)    Calcium 8.7 (*)    All other components within normal limits    EKG None  Radiology Dg Chest Portable 1 View  Result Date: 08/29/2018 CLINICAL DATA:  Shortness of breath since 10:45 a.m. today. EXAM: PORTABLE CHEST 1 VIEW COMPARISON:  08/07/2018. FINDINGS: Poor inspiration. The most inferior portion of the left lung base is not included. The right hemidiaphragm remains mildly elevated. Mild right inferior perihilar atelectasis. Patchy airspace opacity at the left lung base. Stable borderline enlarged cardiac  silhouette and tortuous and partially calcified thoracic  aorta. Mild to moderate bilateral AC joint degenerative spur formation. IMPRESSION: Poor inspiration with mild right inferior perihilar atelectasis and moderate left basilar pneumonia or patchy atelectasis. Electronically Signed   By: Claudie Revering M.D.   On: 08/29/2018 14:09    Procedures Procedures (including critical care time)  Medications Ordered in ED Medications  albuterol (PROVENTIL,VENTOLIN) solution continuous neb (15 mg/hr Nebulization New Bag/Given 08/29/18 1306)  ceFEPIme (MAXIPIME) 1 g in sodium chloride 0.9 % 100 mL IVPB (has no administration in time range)  ipratropium (ATROVENT) nebulizer solution 0.5 mg (0.5 mg Nebulization Given 08/29/18 1306)     Initial Impression / Assessment and Plan / ED Course  I have reviewed the triage vital signs and the nursing notes.  Pertinent labs & imaging results that were available during my care of the patient were reviewed by me and considered in my medical decision making (see chart for details).        63 year old female with acute on chronic dyspnea.  At this point she has received 30 mg of albuterol, 1.5 mg of Atrovent and 125 mg of Solu-Medrol which was given prehospital.  Her O2 sats are good on her baseline oxygen requirement but she still remains pretty tachypneic and symptomatic.  Admit for ongoing treatment.  Chest x-ray equivocal.    Final Clinical Impressions(s) / ED Diagnoses   Final diagnoses:  COPD exacerbation [facility])    ED Discharge Orders    None       Virgel Manifold, MD 08/29/18 [phone]

## 2018-08-30 ENCOUNTER — Observation Stay (HOSPITAL_COMMUNITY): Payer: Medicare HMO

## 2018-08-30 DIAGNOSIS — R0789 Other chest pain: Secondary | ICD-10-CM | POA: Diagnosis not present

## 2018-08-30 LAB — MRSA PCR SCREENING: MRSA by PCR: NEGATIVE

## 2018-08-30 LAB — CBC
HEMATOCRIT: 35.4 % — AB (ref 36.0–46.0)
Hemoglobin: 10.5 g/dL — ABNORMAL LOW (ref 12.0–15.0)
MCH: 30.1 pg (ref 26.0–34.0)
MCHC: 29.7 g/dL — ABNORMAL LOW (ref 30.0–36.0)
MCV: 101.4 fL — ABNORMAL HIGH (ref 80.0–100.0)
Platelets: 315 10*3/uL (ref 150–400)
RBC: 3.49 MIL/uL — ABNORMAL LOW (ref 3.87–5.11)
RDW: 17.6 % — AB (ref 11.5–15.5)
WBC: 7.3 10*3/uL (ref 4.0–10.5)
nRBC: 0 % (ref 0.0–0.2)

## 2018-08-30 LAB — BASIC METABOLIC PANEL
Anion gap: 6 (ref 5–15)
BUN: 14 mg/dL (ref 8–23)
CO2: 26 mmol/L (ref 22–32)
CREATININE: 0.55 mg/dL (ref 0.44–1.00)
Calcium: 8.6 mg/dL — ABNORMAL LOW (ref 8.9–10.3)
Chloride: 106 mmol/L (ref 98–111)
GFR calc Af Amer: >60 mL/min (ref >60)
GFR calc non Af Amer: >60 mL/min (ref >60)
Glucose, Bld: 157 mg/dL — ABNORMAL HIGH (ref 70–99)
Potassium: 4 mmol/L (ref 3.5–5.1)
Sodium: 138 mmol/L (ref 135–145)

## 2018-08-30 LAB — HIV ANTIBODY (ROUTINE TESTING W REFLEX): HIV Screen 4th Generation wRfx: NONREACTIVE

## 2018-08-30 MED ORDER — KETOTIFEN FUMARATE 0.025 % OP SOLN
1.0000 [drp] | Freq: Two times a day (BID) | OPHTHALMIC | Status: DC
  Administered 2018-08-30 – 2018-09-06 (×4): 1 [drp] via OPHTHALMIC
  Filled 2018-08-30: qty 5

## 2018-08-30 MED ORDER — ORAL CARE MOUTH RINSE
15.0000 mL | Freq: Two times a day (BID) | OROMUCOSAL | Status: DC
  Administered 2018-08-30 – 2018-09-07 (×11): 15 mL via OROMUCOSAL

## 2018-08-30 MED ORDER — DICLOFENAC SODIUM 1 % TD GEL
2.0000 g | Freq: Four times a day (QID) | TRANSDERMAL | Status: DC
  Administered 2018-08-30 – 2018-09-06 (×15): 2 g via TOPICAL
  Filled 2018-08-30: qty 100

## 2018-08-30 MED ORDER — AZITHROMYCIN 250 MG PO TABS
500.0000 mg | ORAL_TABLET | Freq: Every day | ORAL | Status: DC
  Administered 2018-08-30 – 2018-09-02 (×4): 500 mg via ORAL
  Filled 2018-08-30 (×4): qty 2

## 2018-08-30 MED ORDER — FLECAINIDE ACETATE 100 MG PO TABS
100.0000 mg | ORAL_TABLET | Freq: Two times a day (BID) | ORAL | Status: DC
  Administered 2018-08-30 – 2018-09-07 (×17): 100 mg via ORAL
  Filled 2018-08-30 (×17): qty 1

## 2018-08-30 NOTE — Progress Notes (Signed)
LCSW consulted for dc planning.  Patient from [facility] for rehab. Patient will require new auth to return. Patient needs updated PT OT notes.   Attending called LCSW with concerns from pt regarding her breathing treatments and rescue inhaler at the facility.   LCSW notified facility of concern and will speak with nurse regarding issues.   Nurse states that orders were PRN and suggest patient returns with scheduled breathing treatments.   LCSW notified attending.  Carolin Coy [facility] [place]

## 2018-08-30 NOTE — Evaluation (Signed)
Occupational Therapy Evaluation Patient Details Name: NECIE WILCOXSON MRN: [id number] DOB: [birth year] Today's Date: 08/30/2018    History of Present Illness [name] White is a 63 y.o. female with medical history significant of COPD, depression, GERD, hypertension, history of pulmonary embolism, atrial fibrillation and hypothyroidism who has been having progressive shortness of breath and cough, readmitted 08/29/18 from [place]   Pt was admitted for the above.  She came from SNF where she has been participating in rehab.  Pt needed mostly min A there for adls and was able to get to the bathroom unassisted. Will follow in acute setting with supervision to mod I level goals    Follow Up Recommendations  SNF    Equipment Recommendations  (defer to next venue)    Recommendations for Other Services       Precautions / Restrictions Precautions Precautions: Fall Precaution Comments: chronic 2L O2 Restrictions Weight Bearing Restrictions: No      Mobility Bed Mobility               General bed mobility comments: oob  Transfers       Sit to Stand: Min guard              Balance                                           ADL either performed or assessed with clinical judgement   ADL Overall ADL's : Needs assistance/impaired Eating/Feeding: Independent   Grooming: Set up;Oral care;Sitting   Upper Body Bathing: Minimal assistance;Sitting   Lower Body Bathing: Set up;Sit to/from stand   Upper Body Dressing : Minimal assistance;Sitting(lines)   Lower Body Dressing: Moderate assistance                 General ADL Comments: pt reports she had just been to bathroom, supervision/min guard for sit to stand.  Pt is limiting movement with RUE due to chest pain.  xray negative     Vision         Perception     Praxis      Pertinent Vitals/Pain Pain Score: 4  Pain Location: right chest(xray -  fx) Pain Descriptors / Indicators: Discomfort Pain Intervention(s): Limited activity within patient's tolerance;Monitored during session     Hand Dominance     Extremity/Trunk Assessment Upper Extremity Assessment Upper Extremity Assessment: RUE deficits/detail(limited movement due to pain)           Communication Communication Communication: No difficulties   Cognition Arousal/Alertness: Awake/alert Behavior During Therapy: WFL for tasks assessed/performed Overall Cognitive Status: Within Functional Limits for tasks assessed                                     General Comments       Exercises General Exercises - Lower Extremity Quad Sets: AROM;Both   Shoulder Instructions      Home Living Family/patient expects to be discharged to:: Skilled nursing facility                                 Additional Comments: was at rehab x 1 week       Prior Functioning/Environment Level of Independence: Independent with assistive  device(s)                 OT Problem List: Decreased strength;Decreased activity tolerance;Cardiopulmonary status limiting activity;Pain;Impaired UE functional use;Impaired balance (sitting and/or standing)      OT Treatment/Interventions: Self-care/ADL training;Energy conservation;DME and/or AE instruction;Patient/family education;Balance training;Therapeutic activities    OT Goals(Current goals can be found in the care plan section) Acute Rehab OT Goals Patient Stated Goal: back to rehab OT Goal Formulation: With patient Time For Goal Achievement: 09/13/18 Potential to Achieve Goals: Good ADL Goals Pt Will Transfer to Toilet: with modified independence;ambulating;regular height toilet Pt Will Perform Toileting - Clothing Manipulation and hygiene: with modified independence Additional ADL Goal #1: pt will perform adl with setup/supervision with AE as needed Additional ADL Goal #2: pt will perform RUE A/AAROM  within pain tolerance with written HEP  OT Frequency: Min 2X/week   Barriers to D/C:            Co-evaluation              AM-PAC OT "6 Clicks" Daily Activity     Outcome Measure Help from another person eating meals?: None Help from another person taking care of personal grooming?: A Little Help from another person toileting, which includes using toliet, bedpan, or urinal?: A Little Help from another person bathing (including washing, rinsing, drying)?: A Little Help from another person to put on and taking off regular upper body clothing?: A Little Help from another person to put on and taking off regular lower body clothing?: A Lot 6 Click Score: 18   End of Session    Activity Tolerance: Patient tolerated treatment well Patient left: in chair;with call bell/phone within reach  OT Visit Diagnosis: Muscle weakness (generalized) (M62.81)                Time: 5643-3295 OT Time Calculation (min): 12 min Charges:  OT General Charges $OT Visit: 1 Visit OT Evaluation $OT Eval Low Complexity: [place], OTR/L Acute Rehabilitation Services [phone] [facility] pager [phone] office 08/30/2018  [place] 08/30/2018, 3:37 PM

## 2018-08-30 NOTE — Evaluation (Signed)
Physical Therapy Evaluation Patient Details Name: [name] White MRN: [id number] DOB: Jan 30, 1956 Today's Date: 08/30/2018   History of Present Illness  [name] White is a 63 y.o. female with medical history significant of COPD, depression, GERD, hypertension, history of pulmonary embolism, atrial fibrillation and hypothyroidism who has been having progressive shortness of breath and cough, readmitted 08/29/18 from [place]  Pt admitted with above diagnosis. Pt currently with functional limitations due to the deficits listed below (see PT Problem List). Recommend return to SNF post acute; pt is motivated and cooperative with PT Pt will benefit from skilled PT to increase their independence and safety with mobility to allow discharge to the venue listed below.       Follow Up Recommendations SNF    Equipment Recommendations  None recommended by PT    Recommendations for Other Services       Precautions / Restrictions Precautions Precautions: Fall Precaution Comments: chronic 2L O2 Restrictions Weight Bearing Restrictions: No      Mobility  Bed Mobility Overal bed mobility: Needs Assistance Bed Mobility: Supine to Sit     Supine to sit: Supervision     General bed mobility comments: required increased time and effort   Transfers Overall transfer level: Needs assistance Equipment used: None Transfers: Sit to/from Stand Sit to Stand: Min guard Stand pivot transfers: Min guard       General transfer comment: verbal cues for safe technique, good carryover from rehab--bed to [facility], BSC to chair ~5' WITH MIN/GUARD   Ambulation/Gait                Stairs            Wheelchair Mobility    Modified Rankin (Stroke Patients Only)       Balance Overall balance assessment: History of Falls   Sitting balance-Leahy Scale: Good       Standing balance-Leahy Scale: Fair                               Pertinent  Vitals/Pain Pain Assessment: 0-10 Pain Score: 4  Pain Location: right chest Pain Descriptors / Indicators: Discomfort Pain Intervention(s): Limited activity within patient's tolerance;Monitored during session    Home Living Family/patient expects to be discharged to:: Skilled nursing facility                      Prior Function                 Hand Dominance        Extremity/Trunk Assessment   Upper Extremity Assessment Upper Extremity Assessment: Defer to OT evaluation    Lower Extremity Assessment Lower Extremity Assessment: Generalized weakness       Communication      Cognition Arousal/Alertness: Awake/alert Behavior During Therapy: WFL for tasks assessed/performed Overall Cognitive Status: Within Functional Limits for tasks assessed                                        General Comments      Exercises General Exercises - Lower Extremity Ankle Circles/Pumps: AROM;Both;15 reps Quad Sets: AROM;Both Short Arc Quad: 15 reps Long Arc Quad: AROM;Both;15 reps Straight Leg Raises: 15 reps;AROM;Both   Assessment/Plan    PT Assessment Patient needs continued PT services  PT Problem List Decreased strength;Decreased  mobility;Decreased activity tolerance;Decreased balance;Decreased knowledge of use of DME;Cardiopulmonary status limiting activity       PT Treatment Interventions DME instruction;Functional mobility training;Patient/family education;Balance training;Gait training;Therapeutic activities;Neuromuscular re-education;Therapeutic exercise;Stair training    PT Goals (Current goals can be found in the Care Plan section)  Acute Rehab PT Goals Patient Stated Goal: back to rehab PT Goal Formulation: With patient Time For Goal Achievement: 09/13/18 Potential to Achieve Goals: Good    Frequency Min 2X/week   Barriers to discharge        Co-evaluation               AM-PAC PT "6 Clicks" Mobility  Outcome Measure Help  needed turning from your back to your side while in a flat bed without using bedrails?: A Little Help needed moving from lying on your back to sitting on the side of a flat bed without using bedrails?: A Little Help needed moving to and from a bed to a chair (including a wheelchair)?: A Little Help needed standing up from a chair using your arms (e.g., wheelchair or bedside chair)?: A Little Help needed to walk in hospital room?: A Little Help needed climbing 3-5 steps with a railing? : A Lot 6 Click Score: 17    End of Session Equipment Utilized During Treatment: Gait belt;Oxygen Activity Tolerance: Patient tolerated treatment well Patient left: with call bell/phone within reach;in chair Nurse Communication: Mobility status PT Visit Diagnosis: Difficulty in walking, not elsewhere classified (R26.2)    Time: 0929-5747 PT Time Calculation (min) (ACUTE ONLY): 31 min   Charges:   PT Evaluation $PT Eval Low Complexity: 1 Low          Kenyon Ana, PT  Pager: [phone] Acute Rehab Dept [facility]): [phone]   08/30/2018   [facility] [place] 08/30/2018, 11:57 AM

## 2018-08-30 NOTE — Progress Notes (Signed)
Nutrition Brief Note  RD consulted via COPD protocol.  Per patient, she eats well at her facility and her good appetite and intakes have continued here at this hospital Pt denies weight changes. Per records, pt has had a steady weight gain over the past 2 years.  Wt Readings from Last 15 Encounters:  08/29/18 136.1 kg  08/08/18 (!) 137.7 kg  07/18/18 (!) 137 kg  07/05/18 (!) 136.5 kg  07/04/18 (!) 136.5 kg  07/01/18 132.5 kg  02/09/18 136.1 kg  12/26/17 (!) 136.4 kg  11/29/17 (!) 136.9 kg  11/27/17 (!) 136.2 kg  02/04/17 117.9 kg  10/03/16 118.8 kg  09/07/15 104.3 kg  09/06/15 104.8 kg  07/29/15 104.7 kg    Body mass index is 49.92 kg/m. Patient meets criteria for morbid obesity based on current BMI.   Current diet order is Heart Healthy, patient is consuming approximately 100% of meals at this time. Labs and medications reviewed.   No nutrition interventions warranted at this time. If nutrition issues arise, please consult RD.   Clayton Bibles, MS, RD, [place] Dietitian Pager: [phone] After Hours Pager: [phone]

## 2018-08-30 NOTE — Progress Notes (Signed)
PROGRESS NOTE    [name] White  CBJ:[id number] DOB: 03-14-1956 DOA: 08/29/2018 PCP: [name], MD   Brief Narrative: [name] White is a 63 y.o. female with a history of asthma, chronic respiratory failure with 2L O2 chronically at home, chronic back pain, depression, GERD, hypertension, hyperthyroidism, migraine, osteoarthritis, paroxysmal atrial fibrillation, and history of pulmonary embolus. She presented secondary to acute respiratory distress and found to have a COPD exacerbation.   Assessment & Plan:   Active Problems:   Morbid obesity (HCC)   GASTROESOPHAGEAL REFLUX, NO ESOPHAGITIS   SOB (shortness of breath)   MDD (major depressive disorder), recurrent episode, severe (HCC)   Viral illness   Atrial fibrillation, chronic   Chronic anticoagulation   History of pulmonary embolus (PE)   Major depressive disorder   COPD with acute exacerbation (HCC)   Acute bronchitis   Acute on chronic respiratory failure with hypoxia (HCC)   COPD exacerbation Likely secondary to viral illness. Afebrile. Sick contact in rehab facility. Normal WBC. Chest x-ray suggests infiltrate vs atelectasis -Continue prednisone, Duoneb scheduled, azithromycin -PT  Acute on chronic respiratory failure with hypoxia Patient is chronically on 2 L via nasal canula. Required 3 L on admission, now back to baseline -Continue oxygen supplementation  Abnormal chest x-ray Infiltrate vs atelectasis. Likely atelectasis vs viral pneumonia. Either way, supportive care and incentive spirometry  Chronic atrial fibrillation Rate controlled. On anticoagulation and CCC/antiarrhythmic/BB -Continue Xarelto -Continue flecainide, diltiazem, metoprolol  Right sided chest pain Reproducible. Possibly secondary to coughing vs physical therapy at SNF. -Right rib x-ray  Depression -Continue Effexor  GERD -Continue Protonix  History of pulmonary embolism -Continue Xarelto   DVT prophylaxis: Xarelto Code  Status:   Code Status: Full Code Family Communication: None at bedside Disposition Plan: Medically stable for discharge. Discharge    Consultants:   None  Procedures:   None  Antimicrobials:  Azithromycin    Subjective: Breathing improved.  Objective: Vitals:   08/29/18 2206 08/30/18 0209 08/30/18 0527 08/30/18 0745  BP:   135/70   Pulse:   62   Resp:   19   Temp:   98.2 F (36.8 C)   TempSrc:   Oral   SpO2: 95% 97% 98% 94%  Weight:      Height:       No intake or output data in the 24 hours ending 08/30/18 1353 Filed Weights   08/29/18 1234  Weight: 136.1 kg    Examination:  General exam: Appears calm and comfortable  Respiratory system: Clear to auscultation. Respiratory effort normal. Able to speak in complete sentences Cardiovascular system: S1 & S2 heard, RRR. No murmurs, rubs, gallops or clicks. Gastrointestinal system: Abdomen is nondistended, soft and nontender. No organomegaly or masses felt. Normal bowel sounds heard. Central nervous system: Alert and oriented. No focal neurological deficits. Extremities: No edema. No calf tenderness Skin: No cyanosis. No rashes Psychiatry: Judgement and insight appear normal. Mood & affect appropriate.     Data Reviewed: I have personally reviewed following labs and imaging studies  CBC: Recent Labs  Lab 08/29/18 1303 08/29/18 1621 08/30/18 0542  WBC 9.3 8.9 7.3  NEUTROABS 6.7  --   --   HGB 11.6* 11.3* 10.5*  HCT 38.4 37.9 35.4*  MCV 101.6* 102.4* 101.4*  PLT 318 357 160   Basic Metabolic Panel: Recent Labs  Lab 08/29/18 1303 08/29/18 1621 08/30/18 0542  NA 138  --  138  K 4.2  --  4.0  CL 104  --  106  CO2 24  --  26  GLUCOSE 115*  --  157*  BUN 11  --  14  CREATININE 0.67 0.89 0.55  CALCIUM 8.7*  --  8.6*   GFR: Estimated Creatinine Clearance: 100.7 mL/min (by C-G formula based on SCr of 0.55 mg/dL). Liver Function Tests: No results for input(s): AST, ALT, ALKPHOS, BILITOT, PROT,  ALBUMIN in the last 168 hours. No results for input(s): LIPASE, AMYLASE in the last 168 hours. No results for input(s): AMMONIA in the last 168 hours. Coagulation Profile: No results for input(s): INR, PROTIME in the last 168 hours. Cardiac Enzymes: No results for input(s): CKTOTAL, CKMB, CKMBINDEX, TROPONINI in the last 168 hours. BNP (last 3 results) No results for input(s): PROBNP in the last 8760 hours. HbA1C: No results for input(s): HGBA1C in the last 72 hours. CBG: No results for input(s): GLUCAP in the last 168 hours. Lipid Profile: No results for input(s): CHOL, HDL, LDLCALC, TRIG, CHOLHDL, LDLDIRECT in the last 72 hours. Thyroid Function Tests: No results for input(s): TSH, T4TOTAL, FREET4, T3FREE, THYROIDAB in the last 72 hours. Anemia Panel: No results for input(s): VITAMINB12, FOLATE, FERRITIN, TIBC, IRON, RETICCTPCT in the last 72 hours. Sepsis Labs: No results for input(s): PROCALCITON, LATICACIDVEN in the last 168 hours.  Recent Results (from the past 240 hour(s))  MRSA PCR Screening     Status: None   Collection Time: 08/30/18  6:10 AM  Result Value Ref Range Status   MRSA by PCR NEGATIVE NEGATIVE Final    Comment:        The GeneXpert MRSA Assay (FDA approved for NASAL specimens only), is one component of a comprehensive MRSA colonization surveillance program. It is not intended to diagnose MRSA infection nor to guide or monitor treatment for MRSA infections. Performed at [facility], [place] [address] [place] [address], [place], [place] [address]          Radiology Studies: Dg Ribs Unilateral Right  Result Date: 08/30/2018 CLINICAL DATA:  Pt reported mid anterior right sided chest pain that started yesterday. Medical hx of asthma and HTN. EXAM: RIGHT RIBS - 2 VIEW COMPARISON:  None. FINDINGS: No displaced RIGHT rib fracture. No pneumothorax. No chest wall abnormality. IMPRESSION: No rib fracture.  No pneumothorax. Electronically Signed   By: Suzy Bouchard M.D.   On: 08/30/2018 10:28   Dg Chest Portable 1 View  Result Date: 08/29/2018 CLINICAL DATA:  Shortness of breath since 10:45 a.m. today. EXAM: PORTABLE CHEST 1 VIEW COMPARISON:  08/07/2018. FINDINGS: Poor inspiration. The most inferior portion of the left lung base is not included. The right hemidiaphragm remains mildly elevated. Mild right inferior perihilar atelectasis. Patchy airspace opacity at the left lung base. Stable borderline enlarged cardiac silhouette and tortuous and partially calcified thoracic aorta. Mild to moderate bilateral AC joint degenerative spur formation. IMPRESSION: Poor inspiration with mild right inferior perihilar atelectasis and moderate left basilar pneumonia or patchy atelectasis. Electronically Signed   By: Claudie Revering M.D.   On: 08/29/2018 14:09        Scheduled Meds: . azelastine  1 spray Each Nare BID  . azithromycin  500 mg Oral Daily  . cycloSPORINE  1 drop Both Eyes BID  . diclofenac sodium  2 g Topical QID  . diltiazem  120 mg Oral Daily  . flecainide  100 mg Oral BID  . fluticasone  1 spray Each Nare Daily  . ipratropium-albuterol  3 mL Nebulization Q6H  . magnesium oxide  400 mg  Oral BID  . mouth rinse  15 mL Mouth Rinse BID  . metoprolol tartrate  25 mg Oral BID  . pantoprazole  40 mg Oral BID  . potassium chloride  40 mEq Oral Daily  . predniSONE  40 mg Oral Q breakfast  . rivaroxaban  20 mg Oral Daily  . venlafaxine XR  150 mg Oral Q breakfast   Continuous Infusions:   LOS: 0 days     Cordelia Poche, MD [facility] 08/30/2018, 1:53 PM  If 7PM-7AM, please contact night-coverage [email or web address]

## 2018-08-30 NOTE — Progress Notes (Signed)
OT Cancellation Note  Patient Details Name: [name] White MRN: [id number] DOB: 09-25-1955   Cancelled Treatment:    Reason Eval/Treat Not Completed: Patient at procedure or test/ unavailable  [name] White 08/30/2018, 9:48 AM  Lesle Chris, OTR/L Acute Rehabilitation Services [phone] [facility] pager [phone] office 08/30/2018

## 2018-08-31 DIAGNOSIS — Z6841 Body Mass Index (BMI) 40.0 and over, adult: Secondary | ICD-10-CM | POA: Diagnosis not present

## 2018-08-31 DIAGNOSIS — G8929 Other chronic pain: Secondary | ICD-10-CM | POA: Diagnosis present

## 2018-08-31 DIAGNOSIS — J45901 Unspecified asthma with (acute) exacerbation: Secondary | ICD-10-CM | POA: Diagnosis present

## 2018-08-31 DIAGNOSIS — Z9981 Dependence on supplemental oxygen: Secondary | ICD-10-CM | POA: Diagnosis not present

## 2018-08-31 DIAGNOSIS — Z888 Allergy status to other drugs, medicaments and biological substances status: Secondary | ICD-10-CM | POA: Diagnosis not present

## 2018-08-31 DIAGNOSIS — R609 Edema, unspecified: Secondary | ICD-10-CM | POA: Diagnosis not present

## 2018-08-31 DIAGNOSIS — Z881 Allergy status to other antibiotic agents status: Secondary | ICD-10-CM | POA: Diagnosis not present

## 2018-08-31 DIAGNOSIS — Z825 Family history of asthma and other chronic lower respiratory diseases: Secondary | ICD-10-CM | POA: Diagnosis not present

## 2018-08-31 DIAGNOSIS — Z885 Allergy status to narcotic agent status: Secondary | ICD-10-CM | POA: Diagnosis not present

## 2018-08-31 DIAGNOSIS — F332 Major depressive disorder, recurrent severe without psychotic features: Secondary | ICD-10-CM | POA: Diagnosis present

## 2018-08-31 DIAGNOSIS — Z8249 Family history of ischemic heart disease and other diseases of the circulatory system: Secondary | ICD-10-CM | POA: Diagnosis not present

## 2018-08-31 DIAGNOSIS — I10 Essential (primary) hypertension: Secondary | ICD-10-CM | POA: Diagnosis present

## 2018-08-31 DIAGNOSIS — Z87891 Personal history of nicotine dependence: Secondary | ICD-10-CM | POA: Diagnosis not present

## 2018-08-31 DIAGNOSIS — M545 Low back pain: Secondary | ICD-10-CM | POA: Diagnosis present

## 2018-08-31 DIAGNOSIS — Z803 Family history of malignant neoplasm of breast: Secondary | ICD-10-CM | POA: Diagnosis not present

## 2018-08-31 DIAGNOSIS — J9621 Acute and chronic respiratory failure with hypoxia: Secondary | ICD-10-CM | POA: Diagnosis present

## 2018-08-31 DIAGNOSIS — Z86711 Personal history of pulmonary embolism: Secondary | ICD-10-CM | POA: Diagnosis not present

## 2018-08-31 DIAGNOSIS — J208 Acute bronchitis due to other specified organisms: Secondary | ICD-10-CM | POA: Diagnosis present

## 2018-08-31 DIAGNOSIS — K219 Gastro-esophageal reflux disease without esophagitis: Secondary | ICD-10-CM | POA: Diagnosis present

## 2018-08-31 DIAGNOSIS — I480 Paroxysmal atrial fibrillation: Secondary | ICD-10-CM | POA: Diagnosis present

## 2018-08-31 DIAGNOSIS — J441 Chronic obstructive pulmonary disease with (acute) exacerbation: Secondary | ICD-10-CM | POA: Diagnosis present

## 2018-08-31 DIAGNOSIS — Z7901 Long term (current) use of anticoagulants: Secondary | ICD-10-CM | POA: Diagnosis not present

## 2018-08-31 DIAGNOSIS — G43909 Migraine, unspecified, not intractable, without status migrainosus: Secondary | ICD-10-CM | POA: Diagnosis present

## 2018-08-31 DIAGNOSIS — I482 Chronic atrial fibrillation, unspecified: Secondary | ICD-10-CM | POA: Diagnosis present

## 2018-08-31 MED ORDER — METHYLPREDNISOLONE SODIUM SUCC 40 MG IJ SOLR
40.0000 mg | Freq: Two times a day (BID) | INTRAMUSCULAR | Status: DC
  Administered 2018-08-31 – 2018-09-01 (×3): 40 mg via INTRAVENOUS
  Filled 2018-08-31 (×3): qty 1

## 2018-08-31 MED ORDER — HYDROCORTISONE ACETATE 25 MG RE SUPP
25.0000 mg | Freq: Two times a day (BID) | RECTAL | Status: DC | PRN
  Administered 2018-08-31 – 2018-09-01 (×2): 25 mg via RECTAL
  Filled 2018-08-31 (×5): qty 1

## 2018-08-31 NOTE — Progress Notes (Addendum)
PROGRESS NOTE    [name] White  ZOX:[id number] DOB: [birth year] DOA: 08/29/2018 PCP: [name], MD   Brief Narrative: [name] White is a 63 y.o. female with a history of asthma, chronic respiratory failure with 2L O2 chronically at home, chronic back pain, depression, GERD, hypertension, hyperthyroidism, migraine, osteoarthritis, paroxysmal atrial fibrillation, and history of pulmonary embolus. She presented secondary to acute respiratory distress and found to have a COPD exacerbation.   Assessment & Plan:   Active Problems:   Morbid obesity (HCC)   GASTROESOPHAGEAL REFLUX, NO ESOPHAGITIS   SOB (shortness of breath)   MDD (major depressive disorder), recurrent episode, severe (HCC)   Viral illness   Atrial fibrillation, chronic   Chronic anticoagulation   History of pulmonary embolus (PE)   Major depressive disorder   COPD with acute exacerbation (HCC)   Acute bronchitis   Acute on chronic respiratory failure with hypoxia (HCC)   COPD exacerbation Likely secondary to viral illness. Afebrile. Sick contact in rehab facility. Normal WBC. Chest x-ray suggests infiltrate vs atelectasis. Worsening wheezing today. -Duoneb scheduled, azithromycin -Discontinue prednisone and start solu-medrol  Acute on chronic respiratory failure with hypoxia Patient is chronically on 2 L via nasal canula. Required 3 L on admission, now back to baseline -Continue oxygen supplementation  Abnormal chest x-ray Infiltrate vs atelectasis. Likely atelectasis vs viral pneumonia. Either way, supportive care and incentive spirometry  Chronic atrial fibrillation Rate controlled. On anticoagulation and CCC/antiarrhythmic/BB -Continue Xarelto -Continue flecainide, diltiazem, metoprolol  Right sided chest pain Reproducible. Possibly secondary to coughing vs physical therapy at SNF. Rib x-ray negative for acute fracture -Continue Voltaren gel  Depression -Continue Effexor  GERD -Continue  Protonix  History of pulmonary embolism -Continue Xarelto   DVT prophylaxis: Xarelto Code Status:   Code Status: Full Code Family Communication: None at bedside Disposition Plan: Discharge in 24 hours pending improvement of wheezing.   Consultants:   None  Procedures:   None  Antimicrobials:  Azithromycin    Subjective: Breathing slightly worsened today.  Objective: Vitals:   08/30/18 2017 08/30/18 2143 08/31/18 0505 08/31/18 0938  BP: (!) 148/61  (!) 157/88   Pulse: 64  65   Resp: 19     Temp: 98.1 F (36.7 C)  98 F (36.7 C)   TempSrc: Oral  Oral   SpO2: 98% 99% 100% 98%  Weight:      Height:        Intake/Output Summary (Last 24 hours) at 08/31/2018 0959 Last data filed at 08/31/2018 0931 Gross per 24 hour  Intake 960 ml  Output -  Net 960 ml   Filed Weights   08/29/18 1234  Weight: 136.1 kg    Examination:  General exam: Appears calm and comfortable Respiratory system: Significant wheezing today. Respiratory effort increased. Cardiovascular system: S1 & S2 heard, RRR. No murmurs, rubs, gallops or clicks. Gastrointestinal system: Abdomen is nondistended, soft and nontender. No organomegaly or masses felt. Normal bowel sounds heard. Central nervous system: Alert and oriented. No focal neurological deficits. Extremities: No edema. No calf tenderness Skin: No cyanosis. No rashes Psychiatry: Judgement and insight appear normal. Mood & affect appropriate.     Data Reviewed: I have personally reviewed following labs and imaging studies  CBC: Recent Labs  Lab 08/29/18 1303 08/29/18 1621 08/30/18 0542  WBC 9.3 8.9 7.3  NEUTROABS 6.7  --   --   HGB 11.6* 11.3* 10.5*  HCT 38.4 37.9 35.4*  MCV 101.6* 102.4* 101.4*  PLT 318 357 315  Basic Metabolic Panel: Recent Labs  Lab 08/29/18 1303 08/29/18 1621 08/30/18 0542  NA 138  --  138  K 4.2  --  4.0  CL 104  --  106  CO2 24  --  26  GLUCOSE 115*  --  157*  BUN 11  --  14  CREATININE 0.67  0.89 0.55  CALCIUM 8.7*  --  8.6*   GFR: Estimated Creatinine Clearance: 100.7 mL/min (by C-G formula based on SCr of 0.55 mg/dL). Liver Function Tests: No results for input(s): AST, ALT, ALKPHOS, BILITOT, PROT, ALBUMIN in the last 168 hours. No results for input(s): LIPASE, AMYLASE in the last 168 hours. No results for input(s): AMMONIA in the last 168 hours. Coagulation Profile: No results for input(s): INR, PROTIME in the last 168 hours. Cardiac Enzymes: No results for input(s): CKTOTAL, CKMB, CKMBINDEX, TROPONINI in the last 168 hours. BNP (last 3 results) No results for input(s): PROBNP in the last 8760 hours. HbA1C: No results for input(s): HGBA1C in the last 72 hours. CBG: No results for input(s): GLUCAP in the last 168 hours. Lipid Profile: No results for input(s): CHOL, HDL, LDLCALC, TRIG, CHOLHDL, LDLDIRECT in the last 72 hours. Thyroid Function Tests: No results for input(s): TSH, T4TOTAL, FREET4, T3FREE, THYROIDAB in the last 72 hours. Anemia Panel: No results for input(s): VITAMINB12, FOLATE, FERRITIN, TIBC, IRON, RETICCTPCT in the last 72 hours. Sepsis Labs: No results for input(s): PROCALCITON, LATICACIDVEN in the last 168 hours.  Recent Results (from the past 240 hour(s))  MRSA PCR Screening     Status: None   Collection Time: 08/30/18  6:10 AM  Result Value Ref Range Status   MRSA by PCR NEGATIVE NEGATIVE Final    Comment:        The GeneXpert MRSA Assay (FDA approved for NASAL specimens only), is one component of a comprehensive MRSA colonization surveillance program. It is not intended to diagnose MRSA infection nor to guide or monitor treatment for MRSA infections. Performed at [facility], [place] [address], [place], [place] [address]          Radiology Studies: Dg Ribs Unilateral Right  Result Date: 08/30/2018 CLINICAL DATA:  Pt reported mid anterior right sided chest pain that started yesterday. Medical hx of asthma and HTN.  EXAM: RIGHT RIBS - 2 VIEW COMPARISON:  None. FINDINGS: No displaced RIGHT rib fracture. No pneumothorax. No chest wall abnormality. IMPRESSION: No rib fracture.  No pneumothorax. Electronically Signed   By: Suzy Bouchard M.D.   On: 08/30/2018 10:28   Dg Chest Portable 1 View  Result Date: 08/29/2018 CLINICAL DATA:  Shortness of breath since 10:45 a.m. today. EXAM: PORTABLE CHEST 1 VIEW COMPARISON:  08/07/2018. FINDINGS: Poor inspiration. The most inferior portion of the left lung base is not included. The right hemidiaphragm remains mildly elevated. Mild right inferior perihilar atelectasis. Patchy airspace opacity at the left lung base. Stable borderline enlarged cardiac silhouette and tortuous and partially calcified thoracic aorta. Mild to moderate bilateral AC joint degenerative spur formation. IMPRESSION: Poor inspiration with mild right inferior perihilar atelectasis and moderate left basilar pneumonia or patchy atelectasis. Electronically Signed   By: Claudie Revering M.D.   On: 08/29/2018 14:09        Scheduled Meds: . azithromycin  500 mg Oral Daily  . cycloSPORINE  1 drop Both Eyes BID  . diclofenac sodium  2 g Topical QID  . diltiazem  120 mg Oral Daily  . flecainide  100 mg Oral BID  .  fluticasone  1 spray Each Nare Daily  . ipratropium-albuterol  3 mL Nebulization Q6H  . ketotifen  1 drop Both Eyes BID  . magnesium oxide  400 mg Oral BID  . mouth rinse  15 mL Mouth Rinse BID  . methylPREDNISolone (SOLU-MEDROL) injection  40 mg Intravenous Q12H  . metoprolol tartrate  25 mg Oral BID  . pantoprazole  40 mg Oral BID  . potassium chloride  40 mEq Oral Daily  . rivaroxaban  20 mg Oral Daily  . venlafaxine XR  150 mg Oral Q breakfast   Continuous Infusions:   LOS: 0 days     Cordelia Poche, MD [facility] 08/31/2018, 9:59 AM  If 7PM-7AM, please contact night-coverage [email or web address]

## 2018-09-01 MED ORDER — METHYLPREDNISOLONE SODIUM SUCC 40 MG IJ SOLR
40.0000 mg | Freq: Every day | INTRAMUSCULAR | Status: DC
  Administered 2018-09-02 – 2018-09-03 (×2): 40 mg via INTRAVENOUS
  Filled 2018-09-01 (×2): qty 1

## 2018-09-01 NOTE — Progress Notes (Signed)
PROGRESS NOTE    [name] White  LKG:[id number] DOB: 1956-02-07 DOA: 08/29/2018 PCP: [name], MD   Brief Narrative: [name] White is a 63 y.o. female with a history of asthma, chronic respiratory failure with 2L O2 chronically at home, chronic back pain, depression, GERD, hypertension, hyperthyroidism, migraine, osteoarthritis, paroxysmal atrial fibrillation, and history of pulmonary embolus. She presented secondary to acute respiratory distress and found to have a COPD exacerbation.   Assessment & Plan:   Active Problems:   Morbid obesity (HCC)   GASTROESOPHAGEAL REFLUX, NO ESOPHAGITIS   SOB (shortness of breath)   MDD (major depressive disorder), recurrent episode, severe (HCC)   Viral illness   Atrial fibrillation, chronic   Chronic anticoagulation   History of pulmonary embolus (PE)   Major depressive disorder   COPD with acute exacerbation (HCC)   Acute bronchitis   Acute on chronic respiratory failure with hypoxia (HCC)   COPD exacerbation Likely secondary to viral illness. Afebrile. Sick contact in rehab facility. Normal WBC. Chest x-ray suggests infiltrate vs atelectasis. Improved slightly from yesterday after transitioning to IV steroids -Duoneb scheduled, azithromycin -Continue solu-medrol and decrease to daily dosing  Acute on chronic respiratory failure with hypoxia Patient is chronically on 2 L via nasal canula. Required 3 L on admission, now back to baseline -Continue oxygen supplementation  Abnormal chest x-ray Infiltrate vs atelectasis. Likely atelectasis vs viral pneumonia. Either way, supportive care and incentive spirometry  Chronic atrial fibrillation Rate controlled. On anticoagulation and CCC/antiarrhythmic/BB -Continue Xarelto -Continue flecainide, diltiazem, metoprolol  Right sided chest pain Reproducible. Possibly secondary to coughing vs physical therapy at SNF. Rib x-ray negative for acute fracture -Continue Voltaren  gel  Depression -Continue Effexor  GERD -Continue Protonix  History of pulmonary embolism -Continue Xarelto  Calf pain Likely secondary to physical therapy. No unilateral swelling. No erythema. Patient is already on Xarelto  DVT prophylaxis: Xarelto Code Status:   Code Status: Full Code Family Communication: None at bedside Disposition Plan: Discharge to SNF when bed available.   Consultants:   None  Procedures:   None  Antimicrobials:  Azithromycin    Subjective: Dyspnea somewhat improved today. Patient states she has some calf pain that has been present since admission. Bilateral. Not worsening.  Objective: Vitals:   09/01/18 0151 09/01/18 0513 09/01/18 0933 09/01/18 0943  BP:  (!) 149/78  (!) 159/83  Pulse:  66  90  Resp:  (!) 21  20  Temp:  97.6 F (36.4 C)  97.6 F (36.4 C)  TempSrc:  Oral  Oral  SpO2: 99% 99% 97% 99%  Weight:      Height:        Intake/Output Summary (Last 24 hours) at 09/01/2018 1104 Last data filed at 09/01/2018 0930 Gross per 24 hour  Intake 1320 ml  Output -  Net 1320 ml   Filed Weights   08/29/18 1234  Weight: 136.1 kg    Examination:  General exam: Appears calm and comfortable Respiratory system: Wheezing bilaterally. Respiratory effort normal. Cardiovascular system: S1 & S2 heard, RRR. No murmurs, rubs, gallops or clicks. Gastrointestinal system: Abdomen is nondistended, soft and nontender. No organomegaly or masses felt. Normal bowel sounds heard. Central nervous system: Alert and oriented. No focal neurological deficits. Extremities: No edema. Calf tenderness. No erythema Skin: No cyanosis. No rashes Psychiatry: Judgement and insight appear normal. Mood & affect appropriate.     Data Reviewed: I have personally reviewed following labs and imaging studies  CBC: Recent Labs  Lab  08/29/18 1303 08/29/18 1621 08/30/18 0542  WBC 9.3 8.9 7.3  NEUTROABS 6.7  --   --   HGB 11.6* 11.3* 10.5*  HCT 38.4 37.9 35.4*   MCV 101.6* 102.4* 101.4*  PLT 318 357 976   Basic Metabolic Panel: Recent Labs  Lab 08/29/18 1303 08/29/18 1621 08/30/18 0542  NA 138  --  138  K 4.2  --  4.0  CL 104  --  106  CO2 24  --  26  GLUCOSE 115*  --  157*  BUN 11  --  14  CREATININE 0.67 0.89 0.55  CALCIUM 8.7*  --  8.6*   GFR: Estimated Creatinine Clearance: 100.7 mL/min (by C-G formula based on SCr of 0.55 mg/dL). Liver Function Tests: No results for input(s): AST, ALT, ALKPHOS, BILITOT, PROT, ALBUMIN in the last 168 hours. No results for input(s): LIPASE, AMYLASE in the last 168 hours. No results for input(s): AMMONIA in the last 168 hours. Coagulation Profile: No results for input(s): INR, PROTIME in the last 168 hours. Cardiac Enzymes: No results for input(s): CKTOTAL, CKMB, CKMBINDEX, TROPONINI in the last 168 hours. BNP (last 3 results) No results for input(s): PROBNP in the last 8760 hours. HbA1C: No results for input(s): HGBA1C in the last 72 hours. CBG: No results for input(s): GLUCAP in the last 168 hours. Lipid Profile: No results for input(s): CHOL, HDL, LDLCALC, TRIG, CHOLHDL, LDLDIRECT in the last 72 hours. Thyroid Function Tests: No results for input(s): TSH, T4TOTAL, FREET4, T3FREE, THYROIDAB in the last 72 hours. Anemia Panel: No results for input(s): VITAMINB12, FOLATE, FERRITIN, TIBC, IRON, RETICCTPCT in the last 72 hours. Sepsis Labs: No results for input(s): PROCALCITON, LATICACIDVEN in the last 168 hours.  Recent Results (from the past 240 hour(s))  MRSA PCR Screening     Status: None   Collection Time: 08/30/18  6:10 AM  Result Value Ref Range Status   MRSA by PCR NEGATIVE NEGATIVE Final    Comment:        The GeneXpert MRSA Assay (FDA approved for NASAL specimens only), is one component of a comprehensive MRSA colonization surveillance program. It is not intended to diagnose MRSA infection nor to guide or monitor treatment for MRSA infections. Performed at [facility], [place] [address] [place], [place] [address]          Radiology Studies: No results found.      Scheduled Meds: . azithromycin  500 mg Oral Daily  . cycloSPORINE  1 drop Both Eyes BID  . diclofenac sodium  2 g Topical QID  . diltiazem  120 mg Oral Daily  . flecainide  100 mg Oral BID  . fluticasone  1 spray Each Nare Daily  . ipratropium-albuterol  3 mL Nebulization Q6H  . ketotifen  1 drop Both Eyes BID  . magnesium oxide  400 mg Oral BID  . mouth rinse  15 mL Mouth Rinse BID  . methylPREDNISolone (SOLU-MEDROL) injection  40 mg Intravenous Q12H  . metoprolol tartrate  25 mg Oral BID  . pantoprazole  40 mg Oral BID  . potassium chloride  40 mEq Oral Daily  . rivaroxaban  20 mg Oral Daily  . venlafaxine XR  150 mg Oral Q breakfast   Continuous Infusions:   LOS: 1 day     Cordelia Poche, MD [facility] 09/01/2018, 11:04 AM  If 7PM-7AM, please contact night-coverage [email or web address]

## 2018-09-02 MED ORDER — CYCLOBENZAPRINE HCL 5 MG PO TABS
5.0000 mg | ORAL_TABLET | Freq: Three times a day (TID) | ORAL | Status: DC | PRN
  Administered 2018-09-02 – 2018-09-07 (×11): 5 mg via ORAL
  Filled 2018-09-02 (×12): qty 1

## 2018-09-02 NOTE — Progress Notes (Signed)
PROGRESS NOTE    [name] White  WEX:[id number] DOB: 11/06/1955 DOA: 08/29/2018 PCP: [name], MD   Brief Narrative: [name] White is a 63 y.o. female with a history of asthma, chronic respiratory failure with 2L O2 chronically at home, chronic back pain, depression, GERD, hypertension, hyperthyroidism, migraine, osteoarthritis, paroxysmal atrial fibrillation, and history of pulmonary embolus. She presented secondary to acute respiratory distress and found to have a COPD exacerbation.   Assessment & Plan:   Active Problems:   Morbid obesity (HCC)   GASTROESOPHAGEAL REFLUX, NO ESOPHAGITIS   SOB (shortness of breath)   MDD (major depressive disorder), recurrent episode, severe (HCC)   Viral illness   Atrial fibrillation, chronic   Chronic anticoagulation   History of pulmonary embolus (PE)   Major depressive disorder   COPD with acute exacerbation (HCC)   Acute bronchitis   Acute on chronic respiratory failure with hypoxia (HCC)   COPD exacerbation Likely secondary to viral illness. Afebrile. Sick contact in rehab facility. Normal WBC. Chest x-ray suggests infiltrate vs atelectasis. Improving on IV steroids. Appears mostly upper respiratory -Duoneb scheduled -Continue solu-medrol  Acute on chronic respiratory failure with hypoxia Patient is chronically on 2 L via nasal canula. Required 3 L on admission, now back to baseline -Continue oxygen supplementation  Abnormal chest x-ray Infiltrate vs atelectasis. Likely atelectasis vs viral pneumonia. Either way, supportive care and incentive spirometry  Chronic atrial fibrillation Rate controlled. On anticoagulation and CCC/antiarrhythmic/BB -Continue Xarelto -Continue flecainide, diltiazem, metoprolol  Right sided chest pain Reproducible. Possibly secondary to coughing vs physical therapy at SNF. Rib x-ray negative for acute fracture -Continue Voltaren gel -Flexeril prn  Depression -Continue  Effexor  GERD -Continue Protonix  History of pulmonary embolism -Continue Xarelto  Calf pain Likely secondary to physical therapy. No unilateral swelling. No erythema. Patient is already on Xarelto -Venous duplex US today  DVT prophylaxis: Xarelto Code Status:   Code Status: Full Code Family Communication: None at bedside Disposition Plan: Discharge to SNF when bed available.   Consultants:   None  Procedures:   None  Antimicrobials:  Azithromycin    Subjective: Shortness of breath overnight. Still with right sided chest pain. Calf swelling still present with calf tenderness  Objective: Vitals:   09/01/18 2017 09/01/18 2122 09/02/18 0534 09/02/18 0830  BP: (!) 151/87  (!) 148/91   Pulse: 67  (!) 58   Resp: (!) 22  20   Temp: 97.8 F (36.6 C)  (!) 97.4 F (36.3 C)   TempSrc: Oral  Oral   SpO2: 99% 98% 100% 98%  Weight:      Height:        Intake/Output Summary (Last 24 hours) at 09/02/2018 1057 Last data filed at 09/01/2018 1829 Gross per 24 hour  Intake 960 ml  Output -  Net 960 ml   Filed Weights   08/29/18 1234  Weight: 136.1 kg    Examination:  General exam: Appears calm and comfortable Respiratory system: Diffuse wheezing that appears to be transmitted from upper airways Respiratory effort normal. Cardiovascular system: S1 & S2 heard, RRR. No murmurs, rubs, gallops or clicks. Gastrointestinal system: Abdomen is nondistended, soft and nontender. No organomegaly or masses felt. Normal bowel sounds heard. Central nervous system: Alert and oriented. No focal neurological deficits. Extremities: Calves swollen. Right calf with some warmth on lateral aspect. No erythema present Skin: No cyanosis. No rashes Psychiatry: Judgement and insight appear normal. Mood & affect appropriate.      Data Reviewed: I  have personally reviewed following labs and imaging studies  CBC: Recent Labs  Lab 08/29/18 1303 08/29/18 1621 08/30/18 0542  WBC 9.3 8.9 7.3   NEUTROABS 6.7  --   --   HGB 11.6* 11.3* 10.5*  HCT 38.4 37.9 35.4*  MCV 101.6* 102.4* 101.4*  PLT 318 357 967   Basic Metabolic Panel: Recent Labs  Lab 08/29/18 1303 08/29/18 1621 08/30/18 0542  NA 138  --  138  K 4.2  --  4.0  CL 104  --  106  CO2 24  --  26  GLUCOSE 115*  --  157*  BUN 11  --  14  CREATININE 0.67 0.89 0.55  CALCIUM 8.7*  --  8.6*   GFR: Estimated Creatinine Clearance: 100.7 mL/min (by C-G formula based on SCr of 0.55 mg/dL). Liver Function Tests: No results for input(s): AST, ALT, ALKPHOS, BILITOT, PROT, ALBUMIN in the last 168 hours. No results for input(s): LIPASE, AMYLASE in the last 168 hours. No results for input(s): AMMONIA in the last 168 hours. Coagulation Profile: No results for input(s): INR, PROTIME in the last 168 hours. Cardiac Enzymes: No results for input(s): CKTOTAL, CKMB, CKMBINDEX, TROPONINI in the last 168 hours. BNP (last 3 results) No results for input(s): PROBNP in the last 8760 hours. HbA1C: No results for input(s): HGBA1C in the last 72 hours. CBG: No results for input(s): GLUCAP in the last 168 hours. Lipid Profile: No results for input(s): CHOL, HDL, LDLCALC, TRIG, CHOLHDL, LDLDIRECT in the last 72 hours. Thyroid Function Tests: No results for input(s): TSH, T4TOTAL, FREET4, T3FREE, THYROIDAB in the last 72 hours. Anemia Panel: No results for input(s): VITAMINB12, FOLATE, FERRITIN, TIBC, IRON, RETICCTPCT in the last 72 hours. Sepsis Labs: No results for input(s): PROCALCITON, LATICACIDVEN in the last 168 hours.  Recent Results (from the past 240 hour(s))  MRSA PCR Screening     Status: None   Collection Time: 08/30/18  6:10 AM  Result Value Ref Range Status   MRSA by PCR NEGATIVE NEGATIVE Final    Comment:        The GeneXpert MRSA Assay (FDA approved for NASAL specimens only), is one component of a comprehensive MRSA colonization surveillance program. It is not intended to diagnose MRSA infection nor to guide  or monitor treatment for MRSA infections. Performed at [facility], [place] [address], [place], [place] [address]          Radiology Studies: No results found.      Scheduled Meds: . azithromycin  500 mg Oral Daily  . cycloSPORINE  1 drop Both Eyes BID  . diclofenac sodium  2 g Topical QID  . diltiazem  120 mg Oral Daily  . flecainide  100 mg Oral BID  . fluticasone  1 spray Each Nare Daily  . ipratropium-albuterol  3 mL Nebulization Q6H  . ketotifen  1 drop Both Eyes BID  . magnesium oxide  400 mg Oral BID  . mouth rinse  15 mL Mouth Rinse BID  . methylPREDNISolone (SOLU-MEDROL) injection  40 mg Intravenous Daily  . metoprolol tartrate  25 mg Oral BID  . pantoprazole  40 mg Oral BID  . potassium chloride  40 mEq Oral Daily  . rivaroxaban  20 mg Oral Daily  . venlafaxine XR  150 mg Oral Q breakfast   Continuous Infusions:   LOS: 2 days     Cordelia Poche, MD [facility] 09/02/2018, 10:57 AM  If 7PM-7AM, please contact night-coverage [email or web address]

## 2018-09-03 ENCOUNTER — Inpatient Hospital Stay (HOSPITAL_COMMUNITY): Payer: Medicare HMO

## 2018-09-03 DIAGNOSIS — R609 Edema, unspecified: Secondary | ICD-10-CM

## 2018-09-03 LAB — GLUCOSE, CAPILLARY: Glucose-Capillary: 141 mg/dL — ABNORMAL HIGH (ref 70–99)

## 2018-09-03 NOTE — Progress Notes (Signed)
Bilateral lower extremity venous duplex has been completed. Preliminary results can be found in CV Proc through chart review.   09/03/18 10:05 AM Carlos Levering RVT

## 2018-09-03 NOTE — Progress Notes (Signed)
PROGRESS NOTE    [name] White  NTI:[id number] DOB: 03-May-1956 DOA: 08/29/2018 PCP: [name], MD   Brief Narrative: [name] White is a 63 y.o. female with a history of asthma, chronic respiratory failure with 2L O2 chronically at home, chronic back pain, depression, GERD, hypertension, hyperthyroidism, migraine, osteoarthritis, paroxysmal atrial fibrillation, and history of pulmonary embolus. She presented secondary to acute respiratory distress and found to have a COPD exacerbation.   Assessment & Plan:   Active Problems:   Morbid obesity (HCC)   GASTROESOPHAGEAL REFLUX, NO ESOPHAGITIS   SOB (shortness of breath)   MDD (major depressive disorder), recurrent episode, severe (HCC)   Viral illness   Atrial fibrillation, chronic   Chronic anticoagulation   History of pulmonary embolus (PE)   Major depressive disorder   COPD with acute exacerbation (HCC)   Acute bronchitis   Acute on chronic respiratory failure with hypoxia (HCC)   COPD exacerbation Likely secondary to viral illness. Afebrile. Sick contact in rehab facility. Normal WBC. Chest x-ray suggests infiltrate vs atelectasis. Improving on IV steroids. Appears mostly upper respiratory -Duoneb scheduled -Continue solu-medrol -Add flutter valve -guaifenesin -Continue protonix in case there is a GERD component  Acute on chronic respiratory failure with hypoxia Patient is chronically on 2 L via nasal canula. Required 3 L on admission, now back to baseline -Continue oxygen supplementation  Abnormal chest x-ray Infiltrate vs atelectasis. Likely atelectasis vs viral pneumonia. Either way, supportive care and incentive spirometry  Chronic atrial fibrillation Rate controlled. On anticoagulation and CCC/antiarrhythmic/BB -Continue Xarelto -Continue flecainide, diltiazem, metoprolol  Right sided chest pain Reproducible. Possibly secondary to coughing vs physical therapy at SNF. Rib x-ray negative for acute  fracture -Continue Voltaren gel, heating pad -Flexeril prn  Depression -Continue Effexor  GERD -Continue Protonix  History of pulmonary embolism -Continue Xarelto  Calf pain Likely secondary to physical therapy. No unilateral swelling. No erythema. Patient is already on Xarelto. There is some warmth, however. -Venous duplex US pending  DVT prophylaxis: Xarelto Code Status:   Code Status: Full Code Family Communication: None at bedside Disposition Plan: Discharge to SNF when bed available.   Consultants:   None  Procedures:   None  Antimicrobials:  Azithromycin    Subjective: Still with wheezing. No other issues.  Objective: Vitals:   09/02/18 2017 09/02/18 2025 09/03/18 0203 09/03/18 0540  BP: (!) 146/78   (!) 147/94  Pulse: 73   74  Resp: 20   (!) 21  Temp: 98.6 F (37 C)   98.5 F (36.9 C)  TempSrc: Oral   Oral  SpO2: 98% 94% 96% 98%  Weight:      Height:        Intake/Output Summary (Last 24 hours) at 09/03/2018 1033 Last data filed at 09/03/2018 8676 Gross per 24 hour  Intake 480 ml  Output -  Net 480 ml   Filed Weights   08/29/18 1234  Weight: 136.1 kg    Examination:  General exam: Appears calm and comfortable Respiratory system: Transmitted upper airway wheezing. Respiratory effort normal. Cardiovascular system: S1 & S2 heard, RRR. No murmurs, rubs, gallops or clicks. Gastrointestinal system: Abdomen is nondistended, soft and nontender. No organomegaly or masses felt. Normal bowel sounds heard. Central nervous system: Alert and oriented. No focal neurological deficits. Extremities: Bilateral leg swelling without pitting edema, warmth, no erythema Skin: No cyanosis. No rashes Psychiatry: Judgement and insight appear normal. Mood & affect appropriate.       Data Reviewed: I have personally  reviewed following labs and imaging studies  CBC: Recent Labs  Lab 08/29/18 1303 08/29/18 1621 08/30/18 0542  WBC 9.3 8.9 7.3  NEUTROABS 6.7   --   --   HGB 11.6* 11.3* 10.5*  HCT 38.4 37.9 35.4*  MCV 101.6* 102.4* 101.4*  PLT 318 357 409   Basic Metabolic Panel: Recent Labs  Lab 08/29/18 1303 08/29/18 1621 08/30/18 0542  NA 138  --  138  K 4.2  --  4.0  CL 104  --  106  CO2 24  --  26  GLUCOSE 115*  --  157*  BUN 11  --  14  CREATININE 0.67 0.89 0.55  CALCIUM 8.7*  --  8.6*   GFR: Estimated Creatinine Clearance: 100.7 mL/min (by C-G formula based on SCr of 0.55 mg/dL). Liver Function Tests: No results for input(s): AST, ALT, ALKPHOS, BILITOT, PROT, ALBUMIN in the last 168 hours. No results for input(s): LIPASE, AMYLASE in the last 168 hours. No results for input(s): AMMONIA in the last 168 hours. Coagulation Profile: No results for input(s): INR, PROTIME in the last 168 hours. Cardiac Enzymes: No results for input(s): CKTOTAL, CKMB, CKMBINDEX, TROPONINI in the last 168 hours. BNP (last 3 results) No results for input(s): PROBNP in the last 8760 hours. HbA1C: No results for input(s): HGBA1C in the last 72 hours. CBG: No results for input(s): GLUCAP in the last 168 hours. Lipid Profile: No results for input(s): CHOL, HDL, LDLCALC, TRIG, CHOLHDL, LDLDIRECT in the last 72 hours. Thyroid Function Tests: No results for input(s): TSH, T4TOTAL, FREET4, T3FREE, THYROIDAB in the last 72 hours. Anemia Panel: No results for input(s): VITAMINB12, FOLATE, FERRITIN, TIBC, IRON, RETICCTPCT in the last 72 hours. Sepsis Labs: No results for input(s): PROCALCITON, LATICACIDVEN in the last 168 hours.  Recent Results (from the past 240 hour(s))  MRSA PCR Screening     Status: None   Collection Time: 08/30/18  6:10 AM  Result Value Ref Range Status   MRSA by PCR NEGATIVE NEGATIVE Final    Comment:        The GeneXpert MRSA Assay (FDA approved for NASAL specimens only), is one component of a comprehensive MRSA colonization surveillance program. It is not intended to diagnose MRSA infection nor to guide or monitor  treatment for MRSA infections. Performed at [facility], [place] [address], [place], [place] [address]          Radiology Studies: No results found.      Scheduled Meds: . cycloSPORINE  1 drop Both Eyes BID  . diclofenac sodium  2 g Topical QID  . diltiazem  120 mg Oral Daily  . flecainide  100 mg Oral BID  . fluticasone  1 spray Each Nare Daily  . ipratropium-albuterol  3 mL Nebulization Q6H  . ketotifen  1 drop Both Eyes BID  . magnesium oxide  400 mg Oral BID  . mouth rinse  15 mL Mouth Rinse BID  . methylPREDNISolone (SOLU-MEDROL) injection  40 mg Intravenous Daily  . metoprolol tartrate  25 mg Oral BID  . pantoprazole  40 mg Oral BID  . potassium chloride  40 mEq Oral Daily  . rivaroxaban  20 mg Oral Daily  . venlafaxine XR  150 mg Oral Q breakfast   Continuous Infusions:   LOS: 3 days     Cordelia Poche, MD [facility] 09/03/2018, 10:33 AM  If 7PM-7AM, please contact night-coverage [email or web address]

## 2018-09-03 NOTE — Care Management Important Message (Signed)
Important Message  Patient Details  Name: [name] White MRN: [id number] Date of Birth: [birth year]   Medicare Important Message Given:  Yes    [name] 09/03/2018, 9:09 AM

## 2018-09-03 NOTE — Progress Notes (Signed)
Per facility patients insurance auth went to [employer].   Awaiting response regarding auth.   Carolin Coy [facility]

## 2018-09-04 ENCOUNTER — Other Ambulatory Visit: Payer: Self-pay | Admitting: *Deleted

## 2018-09-04 MED ORDER — IPRATROPIUM-ALBUTEROL 0.5-2.5 (3) MG/3ML IN SOLN
3.0000 mL | Freq: Three times a day (TID) | RESPIRATORY_TRACT | Status: DC
  Administered 2018-09-05 – 2018-09-07 (×7): 3 mL via RESPIRATORY_TRACT
  Filled 2018-09-04 (×9): qty 3

## 2018-09-04 NOTE — Progress Notes (Signed)
Occupational Therapy Treatment Patient Details Name: [name] White MRN: [id number] DOB: 1955/12/15 Today's Date: 09/04/2018    History of present illness [name] White is a 63 y.o. female with medical history significant of COPD, depression, GERD, hypertension, history of pulmonary embolism, atrial fibrillation and hypothyroidism who has been having progressive shortness of breath and cough, readmitted 08/29/18 from [facility]   OT comments  Pt's RUE improved but still painful.  Educated on HEP and pt able to move enough for greater independence with adls.   Follow Up Recommendations  SNF    Equipment Recommendations  3 in 1 bedside commode    Recommendations for Other Services      Precautions / Restrictions Precautions Precautions: Fall Precaution Comments: chronic 2L O2 Restrictions Weight Bearing Restrictions: No       Mobility Bed Mobility               General bed mobility comments: OOB  Transfers Overall transfer level: Needs assistance Equipment used: Straight cane Transfers: Sit to/from Stand Sit to Stand: Min guard Stand pivot transfers: Min guard       General transfer comment: up in chair    Balance                                           ADL either performed or assessed with clinical judgement   ADL           Upper Body Bathing: Set up   Lower Body Bathing: Min guard   Upper Body Dressing : Minimal assistance(to tie gown)   Lower Body Dressing: Minimal assistance                 General ADL Comments: now able to use RUE for bathing     Vision       Perception     Praxis      Cognition Arousal/Alertness: Awake/alert Behavior During Therapy: WFL for tasks assessed/performed Overall Cognitive Status: Within Functional Limits for tasks assessed                                          Exercises  Other Exercises Other Exercises: worked on [employer] to R FF by using  both arms together, reaching towards feet and tabletop/towel exercises Other Exercises: worked on [employer] via, cradling RUE with L and tabletop with towel Other Exercises: Pt like table top exercises the best; tried all and performed a set of 10 tabletop   Shoulder Instructions       General Comments      Pertinent Vitals/ Pain       Pain Assessment: Faces Faces Pain Scale: Hurts a little bit Pain Location: right chest Pain Descriptors / Indicators: Grimacing Pain Intervention(s): Limited activity within patient's tolerance;Monitored during session;Repositioned;Ice applied  Home Living                                          Prior Functioning/Environment              Frequency  Min 2X/week        Progress Toward Goals  OT Goals(current goals can now be found in the care plan  section)  Progress towards OT goals: Progressing toward goals     Plan      Co-evaluation                 AM-PAC OT "6 Clicks" Daily Activity     Outcome Measure   Help from another person eating meals?: None Help from another person taking care of personal grooming?: A Little Help from another person toileting, which includes using toliet, bedpan, or urinal?: A Little Help from another person bathing (including washing, rinsing, drying)?: A Little Help from another person to put on and taking off regular upper body clothing?: A Little Help from another person to put on and taking off regular lower body clothing?: A Little 6 Click Score: 19    End of Session    OT Visit Diagnosis: Muscle weakness (generalized) (M62.81)   Activity Tolerance Patient tolerated treatment well   Patient Left in chair;with call bell/phone within reach   Nurse Communication          Time: 8088-1103 OT Time Calculation (min): 12 min  Charges: OT General Charges $OT Visit: 1 Visit OT Treatments $Therapeutic Exercise: 8-22 mins  [name] White, OTR/L Acute Rehabilitation  Services [phone] [facility] pager [phone] office 09/04/2018   [place] 09/04/2018, 2:21 PM

## 2018-09-04 NOTE — Progress Notes (Signed)
PROGRESS NOTE    [name] White  NFA:[id number] DOB: 08-31-1955 DOA: 08/29/2018 PCP: [name], MD   Brief Narrative: [name] White is a 63 y.o. female with a history of asthma, chronic respiratory failure with 2L O2 chronically at home, chronic back pain, depression, GERD, hypertension, hyperthyroidism, migraine, osteoarthritis, paroxysmal atrial fibrillation, and history of pulmonary embolus. She presented secondary to acute respiratory distress and found to have a COPD exacerbation.   Assessment & Plan:   Active Problems:   Morbid obesity (HCC)   GASTROESOPHAGEAL REFLUX, NO ESOPHAGITIS   SOB (shortness of breath)   MDD (major depressive disorder), recurrent episode, severe (HCC)   Viral illness   Atrial fibrillation, chronic   Chronic anticoagulation   History of pulmonary embolus (PE)   Major depressive disorder   COPD with acute exacerbation (HCC)   Acute bronchitis   Acute on chronic respiratory failure with hypoxia (HCC)   Asthma exacerbation Likely secondary to viral illness. Afebrile. Sick contact in rehab facility. Normal WBC. Chest x-ray suggests infiltrate vs atelectasis. Improving on IV steroids. Appears mostly upper respiratory. On chart review, patient does not have COPD. Concern for possible asthma and possible vocal cord dysfunction -Duoneb scheduled -Continue flutter valve -guaifenesin -Continue protonix in case there is a GERD component -Follow-up with pulmonology outpatient; can also follow-up with ENT -SLP evaluation  Acute on chronic respiratory failure with hypoxia Patient is chronically on 2 L via nasal canula. Required 3 L on admission, now back to baseline -Continue oxygen supplementation  Abnormal chest x-ray Infiltrate vs atelectasis. Likely atelectasis vs viral pneumonia. Either way, supportive care and incentive spirometry  Chronic atrial fibrillation Rate controlled with some mild bradycardia to 53 overnight. On anticoagulation  and CCC/antiarrhythmic/BB -Continue Xarelto -Continue flecainide, diltiazem, metoprolol  Right sided chest pain Reproducible. Possibly secondary to coughing vs physical therapy at SNF. Rib x-ray negative for acute fracture -Continue Voltaren gel, heating pad -Flexeril prn  Depression -Continue Effexor  GERD -Continue Protonix  History of pulmonary embolism -Continue Xarelto  Calf pain Likely secondary to physical therapy. No unilateral swelling. No erythema. Patient is already on Xarelto. There is some warmth, however. Venous duplex negative.  DVT prophylaxis: Xarelto Code Status:   Code Status: Full Code Family Communication: None at bedside Disposition Plan: Discharge to SNF when bed available. Peer-to-peer pending   Consultants:   None  Procedures:   None  Antimicrobials:  Azithromycin    Subjective: Wheezing. Not happy to hear that insurance is denying her SNF request  Objective: Vitals:   09/04/18 0454 09/04/18 0930 09/04/18 1141 09/04/18 1319  BP: (!) 159/94  131/82 (!) 164/110  Pulse: (!) 53  81 69  Resp: 16   16  Temp: 97.9 F (36.6 C)   98.3 F (36.8 C)  TempSrc: Oral   Oral  SpO2: 100% 97% 94% 100%  Weight:      Height:       No intake or output data in the 24 hours ending 09/04/18 1344 Filed Weights   08/29/18 1234  Weight: 136.1 kg    Examination:  General exam: Appears calm and comfortable Respiratory system: Transmitted upper airway sounds best heard in neck. Respiratory effort normal. Cardiovascular system: S1 & S2 heard, RRR. No murmurs, rubs, gallops or clicks. Gastrointestinal system: Abdomen is nondistended, soft and nontender. No organomegaly or masses felt. Normal bowel sounds heard. Central nervous system: Alert and oriented. No focal neurological deficits. Skin: No cyanosis. No rashes Psychiatry: Judgement and insight appear normal. Mood &  affect appropriate.       Data Reviewed: I have personally reviewed following  labs and imaging studies  CBC: Recent Labs  Lab 08/29/18 1303 08/29/18 1621 08/30/18 0542  WBC 9.3 8.9 7.3  NEUTROABS 6.7  --   --   HGB 11.6* 11.3* 10.5*  HCT 38.4 37.9 35.4*  MCV 101.6* 102.4* 101.4*  PLT 318 357 101   Basic Metabolic Panel: Recent Labs  Lab 08/29/18 1303 08/29/18 1621 08/30/18 0542  NA 138  --  138  K 4.2  --  4.0  CL 104  --  106  CO2 24  --  26  GLUCOSE 115*  --  157*  BUN 11  --  14  CREATININE 0.67 0.89 0.55  CALCIUM 8.7*  --  8.6*   GFR: Estimated Creatinine Clearance: 100.7 mL/min (by C-G formula based on SCr of 0.55 mg/dL). Liver Function Tests: No results for input(s): AST, ALT, ALKPHOS, BILITOT, PROT, ALBUMIN in the last 168 hours. No results for input(s): LIPASE, AMYLASE in the last 168 hours. No results for input(s): AMMONIA in the last 168 hours. Coagulation Profile: No results for input(s): INR, PROTIME in the last 168 hours. Cardiac Enzymes: No results for input(s): CKTOTAL, CKMB, CKMBINDEX, TROPONINI in the last 168 hours. BNP (last 3 results) No results for input(s): PROBNP in the last 8760 hours. HbA1C: No results for input(s): HGBA1C in the last 72 hours. CBG: Recent Labs  Lab 09/03/18 2117  GLUCAP 141*   Lipid Profile: No results for input(s): CHOL, HDL, LDLCALC, TRIG, CHOLHDL, LDLDIRECT in the last 72 hours. Thyroid Function Tests: No results for input(s): TSH, T4TOTAL, FREET4, T3FREE, THYROIDAB in the last 72 hours. Anemia Panel: No results for input(s): VITAMINB12, FOLATE, FERRITIN, TIBC, IRON, RETICCTPCT in the last 72 hours. Sepsis Labs: No results for input(s): PROCALCITON, LATICACIDVEN in the last 168 hours.  Recent Results (from the past 240 hour(s))  MRSA PCR Screening     Status: None   Collection Time: 08/30/18  6:10 AM  Result Value Ref Range Status   MRSA by PCR NEGATIVE NEGATIVE Final    Comment:        The GeneXpert MRSA Assay (FDA approved for NASAL specimens only), is one component of  a comprehensive MRSA colonization surveillance program. It is not intended to diagnose MRSA infection nor to guide or monitor treatment for MRSA infections. Performed at [facility], [place] [address], [place], [place] [address]          Radiology Studies: Vas [place] Lower Extremity Venous (dvt)  Result Date: 09/03/2018  Lower Venous Study Indications: Edema.  Performing Technologist: Oliver Hum RVT  Examination Guidelines: A complete evaluation includes B-mode imaging, spectral Doppler, color Doppler, and power Doppler as needed of all accessible portions of each vessel. Bilateral testing is considered an integral part of a complete examination. Limited examinations for reoccurring indications may be performed as noted.  Right Venous Findings: +---------+---------------+---------+-----------+----------+-------+          CompressibilityPhasicitySpontaneityPropertiesSummary +---------+---------------+---------+-----------+----------+-------+ CFV      Full           Yes      Yes                          +---------+---------------+---------+-----------+----------+-------+ SFJ      Full                                                 +---------+---------------+---------+-----------+----------+-------+  FV Prox  Full                                                 +---------+---------------+---------+-----------+----------+-------+ FV Mid   Full                                                 +---------+---------------+---------+-----------+----------+-------+ FV DistalFull                                                 +---------+---------------+---------+-----------+----------+-------+ PFV      Full                                                 +---------+---------------+---------+-----------+----------+-------+ POP      Full           Yes      Yes                           +---------+---------------+---------+-----------+----------+-------+ PTV      Full                                                 +---------+---------------+---------+-----------+----------+-------+ PERO     Full                                                 +---------+---------------+---------+-----------+----------+-------+  Left Venous Findings: +---------+---------------+---------+-----------+----------+-------+          CompressibilityPhasicitySpontaneityPropertiesSummary +---------+---------------+---------+-----------+----------+-------+ CFV      Full           Yes      Yes                          +---------+---------------+---------+-----------+----------+-------+ SFJ      Full                                                 +---------+---------------+---------+-----------+----------+-------+ FV Prox  Full                                                 +---------+---------------+---------+-----------+----------+-------+ FV Mid   Full                                                 +---------+---------------+---------+-----------+----------+-------+  FV DistalFull                                                 +---------+---------------+---------+-----------+----------+-------+ PFV      Full                                                 +---------+---------------+---------+-----------+----------+-------+ POP      Full           Yes      Yes                          +---------+---------------+---------+-----------+----------+-------+ PTV      Full                                                 +---------+---------------+---------+-----------+----------+-------+ PERO     Full                                                 +---------+---------------+---------+-----------+----------+-------+    Summary: Right: There is no evidence of deep vein thrombosis in the lower extremity. No cystic structure found in the popliteal  fossa. Left: There is no evidence of deep vein thrombosis in the lower extremity. No cystic structure found in the popliteal fossa.  *See table(s) above for measurements and observations. Electronically signed by Ruta Hinds MD on 09/03/2018 at 5:06:48 PM.    Final         Scheduled Meds: . cycloSPORINE  1 drop Both Eyes BID  . diclofenac sodium  2 g Topical QID  . diltiazem  120 mg Oral Daily  . flecainide  100 mg Oral BID  . fluticasone  1 spray Each Nare Daily  . ipratropium-albuterol  3 mL Nebulization Q6H  . ketotifen  1 drop Both Eyes BID  . magnesium oxide  400 mg Oral BID  . mouth rinse  15 mL Mouth Rinse BID  . methylPREDNISolone (SOLU-MEDROL) injection  40 mg Intravenous Daily  . metoprolol tartrate  25 mg Oral BID  . pantoprazole  40 mg Oral BID  . potassium chloride  40 mEq Oral Daily  . rivaroxaban  20 mg Oral Daily  . venlafaxine XR  150 mg Oral Q breakfast   Continuous Infusions:   LOS: 4 days     Cordelia Poche, MD [facility] 09/04/2018, 1:44 PM  If 7PM-7AM, please contact night-coverage [email or web address]

## 2018-09-04 NOTE — Patient Outreach (Signed)
Referral received from [facility] telephonic citing pt discharged 08/29/18 from [facility] but upon further investigation, pt was admitted to hospital on 08/29/18 from [facility] for worsening dyspnea, pt remains inpatient, will continue to follow.   Jacqlyn Larsen [facility], [place] Coordinator [phone]

## 2018-09-04 NOTE — Progress Notes (Signed)
Physical Therapy Treatment Patient Details Name: [name] White MRN: [id number] DOB: 1956/05/02 Today's Date: 09/04/2018    History of Present Illness [name] White is a 63 y.o. female with medical history significant of COPD, depression, GERD, hypertension, history of pulmonary embolism, atrial fibrillation and hypothyroidism who has been having progressive shortness of breath and cough, readmitted 08/29/18 from [facility]    PT Comments    Pt able to walk in room with straight cane and o2, but fatigued quickly.  Continue to recommend returning to SNF for short term rehab.   Follow Up Recommendations  SNF     Equipment Recommendations  None recommended by PT    Recommendations for Other Services       Precautions / Restrictions Precautions Precautions: Fall Precaution Comments: chronic 2L O2 Restrictions Weight Bearing Restrictions: No    Mobility  Bed Mobility               General bed mobility comments: sitting EOB upon arrival  Transfers Overall transfer level: Needs assistance Equipment used: Straight cane Transfers: Sit to/from Stand Sit to Stand: Min guard Stand pivot transfers: Min guard       General transfer comment: Pt demonstrated safe technique, but min/guard for safety  Ambulation/Gait Ambulation/Gait assistance: Min guard Gait Distance (Feet): 28 Feet Assistive device: Straight cane Gait Pattern/deviations: Step-to pattern;Decreased stance time - left;Antalgic     General Gait Details: Amb in room with cane and decreased stance on the L. o2 intact with ambulation.   Stairs             Wheelchair Mobility    Modified Rankin (Stroke Patients Only)       Balance                                            Cognition Arousal/Alertness: Awake/alert Behavior During Therapy: WFL for tasks assessed/performed Overall Cognitive Status: Within Functional Limits for tasks assessed                                         Exercises General Exercises - Lower Extremity Ankle Circles/Pumps: AROM;Both;15 reps Gluteal Sets: 5 reps;Supine Long Arc Quad: AROM;Both;15 reps;Seated Hip Flexion/Marching: AROM;Both;15 reps;Seated    General Comments        Pertinent Vitals/Pain Pain Assessment: Faces Faces Pain Scale: Hurts a little bit Pain Location: (L hip with exercises) Pain Descriptors / Indicators: Grimacing Pain Intervention(s): Monitored during session;Repositioned    Home Living                      Prior Function            PT Goals (current goals can now be found in the care plan section) Acute Rehab PT Goals PT Goal Formulation: With patient Time For Goal Achievement: 09/13/18 Potential to Achieve Goals: Good Progress towards PT goals: Progressing toward goals    Frequency    Min 2X/week      PT Plan Current plan remains appropriate    Co-evaluation              AM-PAC PT "6 Clicks" Mobility   Outcome Measure  Help needed turning from your back to your side while in a flat bed without using bedrails?: A Little  Help needed moving from lying on your back to sitting on the side of a flat bed without using bedrails?: A Little Help needed moving to and from a bed to a chair (including a wheelchair)?: A Little Help needed standing up from a chair using your arms (e.g., wheelchair or bedside chair)?: A Little Help needed to walk in hospital room?: A Little Help needed climbing 3-5 steps with a railing? : A Lot 6 Click Score: 17    End of Session Equipment Utilized During Treatment: Oxygen Activity Tolerance: Patient limited by fatigue Patient left: with call bell/phone within reach;in chair   PT Visit Diagnosis: Difficulty in walking, not elsewhere classified (R26.2)     Time: 6389-3734 PT Time Calculation (min) (ACUTE ONLY): 19 min  Charges:  $Gait Training: 8-22 mins                     [name] White, [place] Pager  [phone] 09/04/2018    [name] White 09/04/2018, 1:38 PM

## 2018-09-04 NOTE — Progress Notes (Signed)
PT Cancellation Note  Patient Details Name: [name] White MRN: [id number] DOB: 1956-06-13   Cancelled Treatment:    Reason Eval/Treat Not Completed: Other (comment). Pt politely declined stating she feels she would move better with PT after she takes her medicine.  Will check back as schedule allows.  Galen Manila 09/04/2018, 10:04 AM

## 2018-09-04 NOTE — Plan of Care (Signed)
  Problem: Activity: Goal: Risk for activity intolerance will decrease Outcome: Progressing   Problem: Pain Managment: Goal: General experience of comfort will improve Outcome: Progressing   

## 2018-09-05 LAB — CREATININE, SERUM
Creatinine, Ser: 0.63 mg/dL (ref 0.44–1.00)
GFR calc Af Amer: >60 mL/min (ref >60)
GFR calc non Af Amer: >60 mL/min (ref >60)

## 2018-09-05 MED ORDER — BENZONATATE 100 MG PO CAPS
200.0000 mg | ORAL_CAPSULE | Freq: Two times a day (BID) | ORAL | Status: DC | PRN
  Administered 2018-09-05 – 2018-09-07 (×4): 200 mg via ORAL
  Filled 2018-09-05 (×4): qty 2

## 2018-09-05 MED ORDER — FLUTICASONE PROPIONATE 50 MCG/ACT NA SUSP: 1.0000 | Freq: Every day | NASAL | Status: DC

## 2018-09-05 MED ORDER — FLUTICASONE PROPIONATE 50 MCG/ACT NA SUSP
2.0000 | Freq: Every day | NASAL | Status: DC
  Administered 2018-09-05 – 2018-09-07 (×3): 2 via NASAL
  Filled 2018-09-05: qty 16

## 2018-09-05 MED ORDER — LORATADINE 10 MG PO TABS
10.0000 mg | ORAL_TABLET | Freq: Every day | ORAL | Status: DC
  Administered 2018-09-05 – 2018-09-07 (×3): 10 mg via ORAL
  Filled 2018-09-05 (×3): qty 1

## 2018-09-05 MED ORDER — GUAIFENESIN ER 600 MG PO TB12
1200.0000 mg | ORAL_TABLET | Freq: Two times a day (BID) | ORAL | Status: DC
  Administered 2018-09-05 – 2018-09-07 (×5): 1200 mg via ORAL
  Filled 2018-09-05 (×5): qty 2

## 2018-09-05 NOTE — Progress Notes (Signed)
Attending completed peer to peer with [employer].   Insurance requesting updated PT/OT notes.  Patient seen by PT yesterday. Needs OT note.   Facility will fax updated notes.   Carolin Coy [name]

## 2018-09-05 NOTE — Evaluation (Signed)
SLP Cancellation Note  Patient Details Name: [name] White MRN: [id number] DOB: [birth year]   Cancelled treatment:       Reason Eval/Treat Not Completed: Other (comment)(pt currently bathing, unable to participate at this time)  Luanna Salk, [facility] SLP Acute Rehab Services Pager [phone] Office [phone]  Macario Golds 09/05/2018, 3:28 PM

## 2018-09-05 NOTE — Progress Notes (Signed)
PROGRESS NOTE    [name]  NWG:[id number] DOB: 25-Dec-1955 DOA: 08/29/2018 PCP: [name], MD   Brief Narrative: [name] White is a 63 y.o. female with a history of asthma, chronic respiratory failure with 2L O2 chronically at home, chronic back pain, depression, GERD, hypertension, hyperthyroidism, migraine, osteoarthritis, paroxysmal atrial fibrillation, and history of pulmonary embolus. She presented secondary to acute respiratory distress and found to have a COPD exacerbation.   Assessment & Plan:   Active Problems:   Morbid obesity (HCC)   GASTROESOPHAGEAL REFLUX, NO ESOPHAGITIS   SOB (shortness of breath)   MDD (major depressive disorder), recurrent episode, severe (HCC)   Viral illness   Atrial fibrillation, chronic   Chronic anticoagulation   History of pulmonary embolus (PE)   Major depressive disorder   COPD with acute exacerbation (HCC)   Acute bronchitis   Acute on chronic respiratory failure with hypoxia (HCC)   Asthma exacerbation Likely secondary to viral illness. Afebrile. Sick contact in rehab facility. Normal WBC. Chest x-ray suggests infiltrate vs atelectasis. Improving on IV steroids. Appears mostly upper respiratory. On chart review, patient does not have COPD. Concern for possible asthma and possible vocal cord dysfunction -Duoneb scheduled -Continue flutter valve -guaifenesin 1200 mg BID -Continue protonix in case there is a GERD component -Follow-up with pulmonology outpatient; can also follow-up with ENT -SLP evaluation  Acute on chronic respiratory failure with hypoxia Patient is chronically on 2 L via nasal canula. Required 3 L on admission, now back to baseline -Continue oxygen supplementation  Abnormal chest x-ray Infiltrate vs atelectasis. Likely atelectasis vs viral pneumonia. Either way, supportive care and incentive spirometry  Chronic atrial fibrillation Rate controlled. On anticoagulation and  CCC/antiarrhythmic/BB -Continue Xarelto -Continue flecainide, diltiazem, metoprolol  Right sided chest pain Reproducible. Possibly secondary to coughing vs physical therapy at SNF. Rib x-ray negative for acute fracture -Continue Voltaren gel, heating pad -Flexeril prn  Depression -Continue Effexor  GERD -Continue Protonix  History of pulmonary embolism -Continue Xarelto  Post-nasal drip Possibly contributing to above. -Continue Flonase, but increase to 2 sprays daily for 7 days -Start Claritin daily  Calf pain Likely secondary to physical therapy. No unilateral swelling. No erythema. Patient is already on Xarelto. There is some warmth, however. Venous duplex negative.  DVT prophylaxis: Xarelto Code Status:   Code Status: Full Code Family Communication: None at bedside Disposition Plan: Discharge to SNF when bed available. Peer-to-peer pending   Consultants:   None  Procedures:   None  Antimicrobials:  Azithromycin    Subjective: Wheezing and cough  Objective: Vitals:   09/04/18 2116 09/04/18 2134 09/05/18 0425 09/05/18 0751  BP: (!) 137/98  138/85   Pulse: 79  76   Resp: 16  (!) 24   Temp: 98.1 F (36.7 C)  97.8 F (36.6 C)   TempSrc: Oral  Oral   SpO2: 99% 98% 100% 97%  Weight:      Height:        Intake/Output Summary (Last 24 hours) at 09/05/2018 0935 Last data filed at 09/04/2018 1443 Gross per 24 hour  Intake --  Output 450 ml  Net -450 ml   Filed Weights   08/29/18 1234  Weight: 136.1 kg    Examination:  General exam: Appears calm and comfortable Respiratory system: Wheezing from upper airway. Respiratory effort normal. Cardiovascular system: S1 & S2 heard, RRR. No murmurs, rubs, gallops or clicks. Gastrointestinal system: Abdomen is nondistended, soft and nontender. No organomegaly or masses felt. Normal bowel sounds  heard. Central nervous system: Alert and oriented. No focal neurological deficits. Extremities: No edema. No calf  tenderness Skin: No cyanosis. No rashes Psychiatry: Judgement and insight appear normal. Mood & affect appropriate.     Data Reviewed: I have personally reviewed following labs and imaging studies  CBC: Recent Labs  Lab 08/29/18 1303 08/29/18 1621 08/30/18 0542  WBC 9.3 8.9 7.3  NEUTROABS 6.7  --   --   HGB 11.6* 11.3* 10.5*  HCT 38.4 37.9 35.4*  MCV 101.6* 102.4* 101.4*  PLT 318 357 185   Basic Metabolic Panel: Recent Labs  Lab 08/29/18 1303 08/29/18 1621 08/30/18 0542 09/05/18 0438  NA 138  --  138  --   K 4.2  --  4.0  --   CL 104  --  106  --   CO2 24  --  26  --   GLUCOSE 115*  --  157*  --   BUN 11  --  14  --   CREATININE 0.67 0.89 0.55 0.63  CALCIUM 8.7*  --  8.6*  --    GFR: Estimated Creatinine Clearance: 100.7 mL/min (by C-G formula based on SCr of 0.63 mg/dL). Liver Function Tests: No results for input(s): AST, ALT, ALKPHOS, BILITOT, PROT, ALBUMIN in the last 168 hours. No results for input(s): LIPASE, AMYLASE in the last 168 hours. No results for input(s): AMMONIA in the last 168 hours. Coagulation Profile: No results for input(s): INR, PROTIME in the last 168 hours. Cardiac Enzymes: No results for input(s): CKTOTAL, CKMB, CKMBINDEX, TROPONINI in the last 168 hours. BNP (last 3 results) No results for input(s): PROBNP in the last 8760 hours. HbA1C: No results for input(s): HGBA1C in the last 72 hours. CBG: Recent Labs  Lab 09/03/18 2117  GLUCAP 141*   Lipid Profile: No results for input(s): CHOL, HDL, LDLCALC, TRIG, CHOLHDL, LDLDIRECT in the last 72 hours. Thyroid Function Tests: No results for input(s): TSH, T4TOTAL, FREET4, T3FREE, THYROIDAB in the last 72 hours. Anemia Panel: No results for input(s): VITAMINB12, FOLATE, FERRITIN, TIBC, IRON, RETICCTPCT in the last 72 hours. Sepsis Labs: No results for input(s): PROCALCITON, LATICACIDVEN in the last 168 hours.  Recent Results (from the past 240 hour(s))  MRSA PCR Screening     Status:  None   Collection Time: 08/30/18  6:10 AM  Result Value Ref Range Status   MRSA by PCR NEGATIVE NEGATIVE Final    Comment:        The GeneXpert MRSA Assay (FDA approved for NASAL specimens only), is one component of a comprehensive MRSA colonization surveillance program. It is not intended to diagnose MRSA infection nor to guide or monitor treatment for MRSA infections. Performed at [facility], [place] [address] [place], [place] [address]          Radiology Studies: Vas [place] Lower Extremity Venous (dvt)  Result Date: 09/03/2018  Lower Venous Study Indications: Edema.  Performing Technologist: Oliver Hum RVT  Examination Guidelines: A complete evaluation includes B-mode imaging, spectral Doppler, color Doppler, and power Doppler as needed of all accessible portions of each vessel. Bilateral testing is considered an integral part of a complete examination. Limited examinations for reoccurring indications may be performed as noted.  Right Venous Findings: +---------+---------------+---------+-----------+----------+-------+            Compressibility Phasicity Spontaneity Properties Summary  +---------+---------------+---------+-----------+----------+-------+  CFV       Full            Yes  Yes                             +---------+---------------+---------+-----------+----------+-------+  SFJ       Full                                                      +---------+---------------+---------+-----------+----------+-------+  FV Prox   Full                                                      +---------+---------------+---------+-----------+----------+-------+  FV Mid    Full                                                      +---------+---------------+---------+-----------+----------+-------+  FV Distal Full                                                      +---------+---------------+---------+-----------+----------+-------+  PFV       Full                                                       +---------+---------------+---------+-----------+----------+-------+  POP       Full            Yes       Yes                             +---------+---------------+---------+-----------+----------+-------+  PTV       Full                                                      +---------+---------------+---------+-----------+----------+-------+  PERO      Full                                                      +---------+---------------+---------+-----------+----------+-------+  Left Venous Findings: +---------+---------------+---------+-----------+----------+-------+            Compressibility Phasicity Spontaneity Properties Summary  +---------+---------------+---------+-----------+----------+-------+  CFV       Full            Yes       Yes                             +---------+---------------+---------+-----------+----------+-------+  SFJ  Full                                                      +---------+---------------+---------+-----------+----------+-------+  FV Prox   Full                                                      +---------+---------------+---------+-----------+----------+-------+  FV Mid    Full                                                      +---------+---------------+---------+-----------+----------+-------+  FV Distal Full                                                      +---------+---------------+---------+-----------+----------+-------+  PFV       Full                                                      +---------+---------------+---------+-----------+----------+-------+  POP       Full            Yes       Yes                             +---------+---------------+---------+-----------+----------+-------+  PTV       Full                                                      +---------+---------------+---------+-----------+----------+-------+  PERO      Full                                                       +---------+---------------+---------+-----------+----------+-------+    Summary: Right: There is no evidence of deep vein thrombosis in the lower extremity. No cystic structure found in the popliteal fossa. Left: There is no evidence of deep vein thrombosis in the lower extremity. No cystic structure found in the popliteal fossa.  *See table(s) above for measurements and observations. Electronically signed by Ruta Hinds MD on 09/03/2018 at 5:06:48 PM.    Final         Scheduled Meds:  cycloSPORINE  1 drop Both Eyes BID   diclofenac sodium  2 g Topical QID   diltiazem  120 mg Oral Daily   flecainide  100 mg Oral BID   fluticasone  2 spray Each Nare  Daily   Followed by   Derrill Memo ON 09/12/2018] fluticasone  1 spray Each Nare Daily   guaiFENesin  1,200 mg Oral BID   ipratropium-albuterol  3 mL Nebulization TID   ketotifen  1 drop Both Eyes BID   loratadine  10 mg Oral Daily   magnesium oxide  400 mg Oral BID   mouth rinse  15 mL Mouth Rinse BID   metoprolol tartrate  25 mg Oral BID   pantoprazole  40 mg Oral BID   potassium chloride  40 mEq Oral Daily   rivaroxaban  20 mg Oral Daily   venlafaxine XR  150 mg Oral Q breakfast   Continuous Infusions:   LOS: 5 days     Cordelia Poche, MD [facility] 09/05/2018, 9:35 AM  If 7PM-7AM, please contact night-coverage [email or web address]

## 2018-09-06 NOTE — Progress Notes (Signed)
Occupational Therapy Treatment Patient Details Name: [name] White MRN: [id number] DOB: March 29, 1956 Today's Date: 09/06/2018    History of present illness [name] White is a 63 y.o. female with medical history significant of COPD, depression, GERD, hypertension, history of pulmonary embolism, atrial fibrillation and hypothyroidism who has been having progressive shortness of breath and cough, readmitted 08/29/18 from [facility]   OT comments  Pt progressing towards acute OT goals. Pt received preparing for bath. Pt completed functional transfer, grooming tasks and UB bathing during OT session. Pt utilizing rest breaks and extended time  "I take my time." Reviewed energy conservation strategies. D/c plan remains appropriate.    Follow Up Recommendations  SNF    Equipment Recommendations  Other (comment)(defer to next venue)    Recommendations for Other Services      Precautions / Restrictions Precautions Precautions: Fall Precaution Comments: chronic 2L O2 Restrictions Weight Bearing Restrictions: No       Mobility Bed Mobility Overal bed mobility: Needs Assistance             General bed mobility comments: EOB on OT   Transfers Overall transfer level: Needs assistance Equipment used: Straight cane Transfers: Sit to/from Stand;Stand Pivot Transfers Sit to Stand: Min guard Stand pivot transfers: Min guard       General transfer comment: EOB to chair    Balance Overall balance assessment: History of Falls   Sitting balance-Leahy Scale: Good       Standing balance-Leahy Scale: Fair                             ADL either performed or assessed with clinical judgement   ADL Overall ADL's : Needs assistance/impaired     Grooming: Set up;Oral care;Sitting   Upper Body Bathing: Set up;Sitting   Lower Body Bathing: Min guard;Minimal assistance Lower Body Bathing Details (indicate cue type and reason): clinical judgement Upper Body  Dressing : Set up;Sitting                     General ADL Comments: Pt completed grooming tasks and UB bathing in seated and sit<>stand position. Rest breaks utilized. Energy conservation education discussed. Pt utilizing extended time for bathing tasks "I take my time."     Vision       Perception     Praxis      Cognition Arousal/Alertness: Awake/alert Behavior During Therapy: WFL for tasks assessed/performed Overall Cognitive Status: Within Functional Limits for tasks assessed                                          Exercises     Shoulder Instructions       General Comments      Pertinent Vitals/ Pain       Pain Assessment: Faces Faces Pain Scale: Hurts little more Pain Location: chest, aches from muscle Pain Descriptors / Indicators: Aching;Sore Pain Intervention(s): Monitored during session  Home Living       Type of Home: House                                  Prior Functioning/Environment              Frequency  Min 2X/week  Progress Toward Goals  OT Goals(current goals can now be found in the care plan section)  Progress towards OT goals: Progressing toward goals  Acute Rehab OT Goals Patient Stated Goal: back to rehab OT Goal Formulation: With patient Time For Goal Achievement: 09/13/18 Potential to Achieve Goals: Good ADL Goals Pt Will Transfer to Toilet: with modified independence;ambulating;regular height toilet Pt Will Perform Toileting - Clothing Manipulation and hygiene: with modified independence Additional ADL Goal #1: pt will perform adl with setup/supervision with AE as needed Additional ADL Goal #2: pt will perform RUE A/AAROM within pain tolerance with written HEP  Plan Discharge plan remains appropriate    Co-evaluation                 AM-PAC OT "6 Clicks" Daily Activity     Outcome Measure   Help from another person eating meals?: None Help from another person  taking care of personal grooming?: A Little Help from another person toileting, which includes using toliet, bedpan, or urinal?: A Little Help from another person bathing (including washing, rinsing, drying)?: A Little Help from another person to put on and taking off regular upper body clothing?: A Little Help from another person to put on and taking off regular lower body clothing?: A Little 6 Click Score: 19    End of Session Equipment Utilized During Treatment: Oxygen(off for oral care, face washing per pt request )  OT Visit Diagnosis: Muscle weakness (generalized) (M62.81)   Activity Tolerance Patient tolerated treatment well   Patient Left in chair;with call bell/phone within reach   Nurse Communication          Time: 1341-1410 OT Time Calculation (min): 29 min  Charges: OT General Charges $OT Visit: 1 Visit OT Treatments $Self Care/Home Management : 23-37 mins  Tyrone Schimke, OT Acute Rehabilitation Services Pager: [phone] Office: [phone]    Hortencia Pilar 09/06/2018, 2:26 PM

## 2018-09-06 NOTE — Evaluation (Signed)
Speech Language Pathology Evaluation Patient Details Name: [name] White MRN: [id number] DOB: 01-23-1956 Today's Date: 09/06/2018 Time: 8811-0315 SLP Time Calculation (min) (ACUTE ONLY): 33 min  Problem List:  Patient Active Problem List   Diagnosis Date Noted  . COPD with acute exacerbation ([place]) 08/29/2018  . Acute bronchitis 08/29/2018  . Acute on chronic respiratory failure with hypoxia ([place]) 08/29/2018  . Sleep apnea 07/05/2018  . Chronic respiratory failure with hypoxia ([place]) 07/05/2018  . Osteoarthritis 03/02/2018  . Viral illness 02/13/2018  . Nausea 02/13/2018  . Left medial tibial stress syndrome 12/26/2017  . Hematoma 11/27/2017  . Severe episode of recurrent major depressive disorder, without psychotic features ([place])   . MDD (major depressive disorder), recurrent episode, severe ([place]) 09/07/2015  . Atrial flutter ([place]) 07/29/2015  . History of atrial flutter 06/26/2015  . Chest pain 03/22/2015  . Asthma 11/12/2014  . History of hypertension 11/05/2014  . Atrial fibrillation, chronic 11/03/2014  . Chronic anticoagulation 11/03/2014  . History of pulmonary embolus (PE) 11/03/2014  . Non-traumatic rhabdomyolysis 11/03/2014  . Major depressive disorder 11/03/2014  . SOB (shortness of breath)   . MDD (major depressive disorder), recurrent severe, without psychosis ([place]) 03/05/2014  . Pulmonary embolism ([place]) 12/27/2013  . Vocal cord dysfunction 11/12/2013  . Mixed headache 01/06/2012  . OVERACTIVE BLADDER 04/18/2008  . Osteoarthritis of both knees 04/18/2008  . POLYARTHRITIS 09/14/2007  . Morbid obesity ([place]) 08/24/2006  . RESTLESS LEGS SYNDROME 08/24/2006  . HYPERTENSION, BENIGN SYSTEMIC 08/24/2006  . RHINITIS, ALLERGIC 08/24/2006  . GASTROESOPHAGEAL REFLUX, NO ESOPHAGITIS 08/24/2006   Past Medical History:  Past Medical History:  Diagnosis Date  . Anxiety   . Asthma   . Chronic lower back pain   . Depression   . Family history of anesthesia complication     "daughter; causes her to pass out afterwards"  . GERD (gastroesophageal reflux disease)   . Hypertension   . Hyperthyroidism   . Migraine    "monthly" (12/28/2013)  . Osteoarthritis    "both knees; back of my neck; right pelvic bone" (12/28/2013)  . Paroxysmal A-fib ([place])   . Pulmonary embolism ([place]) 12/28/2013   "2 clots in each lung"   Past Surgical History:  Past Surgical History:  Procedure Laterality Date  . ABDOMINAL HYSTERECTOMY    . APPENDECTOMY    . BREAST CYST EXCISION Right   . DILATION AND CURETTAGE OF UTERUS    . ELECTROPHYSIOLOGIC STUDY N/A 05/27/2015   Procedure: A-Flutter;  Surgeon: Evans Lance, MD;  Location: [facility];  Service: Cardiovascular;  Laterality: N/A;  . EXCISIONAL HEMORRHOIDECTOMY    . NASAL SINUS SURGERY  2007  . THYROIDECTOMY, PARTIAL Right 2005  . TUBAL LIGATION    . WISDOM TOOTH EXTRACTION     HPI:  63 yo female adm to [facility] with COPD exacerbation.  PMH + for diagnosis of vocal cord dysfunction, COPD.  SLP referral received for VCD.     Assessment / Plan / Recommendation Clinical Impression  Referral for VCD appreciated.  Pt's voice is harsh - reviewed pt's diagnosis of VCD and symptoms/signs as well as triggers and exercises to use to abate episode. Pt advised she has multiple triggers including stress, exercise, ammonia/bleach and dust.     Using teach back, pt able to define VCD, verbalize 3 possible triggers and demonstrate diaphragmatic breathing to use as rescue.  Given h/o reflux, SlP provided her with reflux precautions including behavior and food modification.  Pt expressed gratitude for  the information and reported it was very helpful.  As pt with great understanding, recall and demonstration of VCD, cause and strategies - no follow up needed.  Thanks very much for this consult.      SLP Assessment  SLP Recommendation/Assessment: Patient does not need any further Speech Lanaguage Pathology Services    Follow Up  Recommendations  None    Frequency and Duration     n/a      SLP Evaluation Cognition  Overall Cognitive Status: Within Functional Limits for tasks assessed Arousal/Alertness: Awake/alert Orientation Level: Oriented X4 Memory: Appears intact       Comprehension  Auditory Comprehension Overall Auditory Comprehension: Appears within functional limits for tasks assessed    Expression Expression Primary Mode of Expression: Verbal Verbal Expression Overall Verbal Expression: Appears within functional limits for tasks assessed   Oral / Motor  Motor Speech Overall Motor Speech: Appears within functional limits for tasks assessed Phonation: Hoarse Articulation: Within functional limitis Intelligibility: Intelligible Motor Planning: Witnin functional limits   GO                    Macario Golds 09/06/2018, 11:42 AM  Luanna Salk, MS [facility] SLP Acute Rehab Services Pager [phone] Office [phone]

## 2018-09-06 NOTE — Progress Notes (Signed)
PROGRESS NOTE    [name]  FIE:[id number] DOB: 08-27-1955 DOA: 08/29/2018 PCP: [name], MD   Brief Narrative: [name] White is a 63 y.o. female with a history of asthma, chronic respiratory failure with 2L O2 chronically at home, chronic back pain, depression, GERD, hypertension, hyperthyroidism, migraine, osteoarthritis, paroxysmal atrial fibrillation, and history of pulmonary embolus. She presented secondary to acute respiratory distress and found to have a COPD exacerbation.   Assessment & Plan:   Active Problems:   Morbid obesity (HCC)   GASTROESOPHAGEAL REFLUX, NO ESOPHAGITIS   SOB (shortness of breath)   MDD (major depressive disorder), recurrent episode, severe (HCC)   Viral illness   Atrial fibrillation, chronic   Chronic anticoagulation   History of pulmonary embolus (PE)   Major depressive disorder   COPD with acute exacerbation (HCC)   Acute bronchitis   Acute on chronic respiratory failure with hypoxia (HCC)   Asthma exacerbation Likely secondary to viral illness. Afebrile. Sick contact in rehab facility. Normal WBC. Chest x-ray suggests infiltrate vs atelectasis. Improving on IV steroids. Appears mostly upper respiratory. On chart review, patient does not have COPD. Concern for possible asthma and possible vocal cord dysfunction. Seen by speech therapy -Duoneb scheduled -Continue flutter valve -guaifenesin 1200 mg BID -Continue protonix in case there is a GERD component -Follow-up with pulmonology outpatient; can also follow-up with ENT  Acute on chronic respiratory failure with hypoxia Patient is chronically on 2 L via nasal canula. Required 3 L on admission, now back to baseline -Continue oxygen supplementation  Abnormal chest x-ray Infiltrate vs atelectasis. Likely atelectasis vs viral pneumonia. Either way, supportive care and incentive spirometry  Chronic atrial fibrillation Rate controlled. On anticoagulation and CCC/antiarrhythmic/BB  -Continue Xarelto -Continue flecainide, diltiazem, metoprolol  Right sided chest pain Reproducible. Possibly secondary to coughing vs physical therapy at SNF. Rib x-ray negative for acute fracture -Continue Voltaren gel, heating pad prn -Flexeril prn  Depression -Continue Effexor  GERD -Continue Protonix  History of pulmonary embolism -Continue Xarelto  Post-nasal drip Possibly contributing to above. -Continue Flonase, but increase to 2 sprays daily for 7 days -Continue Claritin daily  Calf pain Likely secondary to physical therapy. No unilateral swelling. No erythema. Patient is already on Xarelto. There is some warmth, however. Venous duplex negative.  DVT prophylaxis: Xarelto Code Status:   Code Status: Full Code Family Communication: None at bedside Disposition Plan: Discharge to SNF when bed available. Peer-to-peer completed and awaiting reevaluation   Consultants:   None  Procedures:   None  Antimicrobials:  Azithromycin    Subjective: Wheezing and cough improved from yesterday.  Objective: Vitals:   09/05/18 1443 09/05/18 2006 09/05/18 2112 09/06/18 0538  BP: (!) 105/59  (!) 136/59 (!) 149/94  Pulse: 69  76 61  Resp: 16  20 19   Temp: 98.3 F (36.8 C)  97.6 F (36.4 C) 97.9 F (36.6 C)  TempSrc: Oral  Oral Oral  SpO2: 100% 98% 96% 98%  Weight:      Height:        Intake/Output Summary (Last 24 hours) at 09/06/2018 1200 Last data filed at 09/05/2018 1600 Gross per 24 hour  Intake 480 ml  Output -  Net 480 ml   Filed Weights   08/29/18 1234  Weight: 136.1 kg    Examination:  General exam: Appears calm and comfortable Respiratory system: Transmitted upper airway sounds. Respiratory effort normal. Cardiovascular system: S1 & S2 heard, RRR. No murmurs, rubs, gallops or clicks. Gastrointestinal system: Abdomen  is nondistended, soft and nontender. No organomegaly or masses felt. Normal bowel sounds heard. Central nervous system: Alert and  oriented. No focal neurological deficits. Extremities: No calf tenderness Skin: No cyanosis. No rashes Psychiatry: Judgement and insight appear normal. Mood & affect appropriate.     Data Reviewed: I have personally reviewed following labs and imaging studies  CBC: No results for input(s): WBC, NEUTROABS, HGB, HCT, MCV, PLT in the last 168 hours. Basic Metabolic Panel: Recent Labs  Lab 09/05/18 0438  CREATININE 0.63   GFR: Estimated Creatinine Clearance: 100.7 mL/min (by C-G formula based on SCr of 0.63 mg/dL). Liver Function Tests: No results for input(s): AST, ALT, ALKPHOS, BILITOT, PROT, ALBUMIN in the last 168 hours. No results for input(s): LIPASE, AMYLASE in the last 168 hours. No results for input(s): AMMONIA in the last 168 hours. Coagulation Profile: No results for input(s): INR, PROTIME in the last 168 hours. Cardiac Enzymes: No results for input(s): CKTOTAL, CKMB, CKMBINDEX, TROPONINI in the last 168 hours. BNP (last 3 results) No results for input(s): PROBNP in the last 8760 hours. HbA1C: No results for input(s): HGBA1C in the last 72 hours. CBG: Recent Labs  Lab 09/03/18 2117  GLUCAP 141*   Lipid Profile: No results for input(s): CHOL, HDL, LDLCALC, TRIG, CHOLHDL, LDLDIRECT in the last 72 hours. Thyroid Function Tests: No results for input(s): TSH, T4TOTAL, FREET4, T3FREE, THYROIDAB in the last 72 hours. Anemia Panel: No results for input(s): VITAMINB12, FOLATE, FERRITIN, TIBC, IRON, RETICCTPCT in the last 72 hours. Sepsis Labs: No results for input(s): PROCALCITON, LATICACIDVEN in the last 168 hours.  Recent Results (from the past 240 hour(s))  MRSA PCR Screening     Status: None   Collection Time: 08/30/18  6:10 AM  Result Value Ref Range Status   MRSA by PCR NEGATIVE NEGATIVE Final    Comment:        The GeneXpert MRSA Assay (FDA approved for NASAL specimens only), is one component of a comprehensive MRSA colonization surveillance program. It is  not intended to diagnose MRSA infection nor to guide or monitor treatment for MRSA infections. Performed at [facility] [place] [address], [place], [place] [address]          Radiology Studies: No results found.      Scheduled Meds: . cycloSPORINE  1 drop Both Eyes BID  . diclofenac sodium  2 g Topical QID  . diltiazem  120 mg Oral Daily  . flecainide  100 mg Oral BID  . fluticasone  2 spray Each Nare Daily   Followed by  . [START ON 09/12/2018] fluticasone  1 spray Each Nare Daily  . guaiFENesin  1,200 mg Oral BID  . ipratropium-albuterol  3 mL Nebulization TID  . ketotifen  1 drop Both Eyes BID  . loratadine  10 mg Oral Daily  . magnesium oxide  400 mg Oral BID  . mouth rinse  15 mL Mouth Rinse BID  . metoprolol tartrate  25 mg Oral BID  . pantoprazole  40 mg Oral BID  . potassium chloride  40 mEq Oral Daily  . rivaroxaban  20 mg Oral Daily  . venlafaxine XR  150 mg Oral Q breakfast   Continuous Infusions:   LOS: 6 days     Cordelia Poche, MD [facility] 09/06/2018, 12:00 PM  If 7PM-7AM, please contact night-coverage [email or web address]

## 2018-09-06 NOTE — Care Management Important Message (Signed)
Important Message  Patient Details  Name: [name] White MRN: [id number] Date of Birth: Nov 22, 1955   Medicare Important Message Given:  Yes    Kerin Salen 09/06/2018, 10:31 AMImportant Message  Patient Details  Name: [name] White MRN: [id number] Date of Birth: 1956/06/23   Medicare Important Message Given:  Yes    Kerin Salen 09/06/2018, 10:31 AM

## 2018-09-06 NOTE — Progress Notes (Signed)
Peer to Peer was completed by attending physician. The patient was again denied rehab at [facility]. CSW notified the physician.  CSW informed the patient at bedside. Patient very upset about denial. Patient reached out to her [facility] insurance case manager- Jamas Lav  Patient reports she is unable to pay private at [facility].    Discharge Plan to be determine.   Kathrin Greathouse, Marlinda Mike, MSW Clinical Social Worker  [phone] 09/06/2018  3:38 PM

## 2018-09-07 MED ORDER — GUAIFENESIN ER 600 MG PO TB12: 1200.0000 mg | ORAL_TABLET | Freq: Two times a day (BID) | ORAL | 0 refills | Status: AC

## 2018-09-07 MED ORDER — PREDNISONE 10 MG PO TABS: ORAL_TABLET | ORAL | 0 refills | Status: DC

## 2018-09-07 NOTE — Consult Note (Signed)
   [facility] CM Inpatient Consult   09/07/2018  [name] White [birth year] [id number]  Patient chart has been reviewed for readmissions less than 30 days and for medium risk score, 21%, for unplanned readmissions. [employer] insurance referral had been placed for post SNF follow up prior to hospital admission. Patient is pending with [facility] for follow up needs.  Chart review reveals patient to transition home with home health services with [facility].  Will notify community RN of patient disposition.  Netta Cedars, MSN, [facility] Liaison Nurse Mobile Phone [phone]  Toll free office [phone]

## 2018-09-07 NOTE — Progress Notes (Addendum)
Physical Therapy Treatment Patient Details Name: ROSALEE White MRN: [id number] DOB: December 07, 1955 Today's Date: 09/07/2018    History of Present Illness [name] White is a 63 y.o. female with medical history significant of COPD, depression, GERD, hypertension, history of pulmonary embolism, atrial fibrillation and hypothyroidism who has been having progressive shortness of breath and cough, readmitted 08/29/18 from [facility]. Pt was living with her sister prior to hospital admission in February 2020.     PT Comments    Pt reports her insurance denied SNF stay, and that she cannot DC to her sister's home due to inability to navigate flight of stairs to bedroom there, she also stated her sister works and her brother in law is disabled so she wouldn't have assistance available there. She reports she has no other family available to assist her. Pt ambulated 73' with rollator today, 2 standing rest breaks required 2* 3/4 dyspnea, SaO2 98% on 2L O2. Wheezing noted with breathing. Pt fatigues very quickly with minimal activity. She puts forth good effort.  24 hour assistance/supervision recommended. At present functional level, pt is not able to complete ADLs independently. She would be unable to obtain groceries on her own. She is not yet ready to DC to an independent living situation. Pt reports she will need to stay at a hotel or B & B if SNF not an option.    Follow Up Recommendations  SNF; 24 hour supervision (If DC to hotel, recommend max [place], HHA)     Equipment Recommendations  rollator (wide)   Recommendations for Other Services       Precautions / Restrictions Precautions Precautions: Fall Precaution Comments: chronic 2L O2; 2 falls in January 2020 Restrictions Weight Bearing Restrictions: No    Mobility  Bed Mobility               General bed mobility comments: sitting EOB upon arrival  Transfers Overall transfer level: Needs  assistance Equipment used: 4-wheeled walker Transfers: Sit to/from Stand Sit to Stand: Supervision         General transfer comment: VCs hand placement, and to lock brakes on rollator  Ambulation/Gait Ambulation/Gait assistance: Supervision Gait Distance (Feet): 48 Feet Assistive device: 4-wheeled walker Gait Pattern/deviations: Step-through pattern;Decreased stride length Gait velocity: decreased    General Gait Details: 2 standing rest breaks 2* 3/4 dyspnea, pt ambulated with 2L on O2, SaO2 98% walking on 2L, SaO2 96% on room air at rest   Stairs             Wheelchair Mobility    Modified Rankin (Stroke Patients Only)       Balance Overall balance assessment: History of Falls   Sitting balance-Leahy Scale: Good       Standing balance-Leahy Scale: Fair                              Cognition Arousal/Alertness: Awake/alert Behavior During Therapy: WFL for tasks assessed/performed Overall Cognitive Status: Within Functional Limits for tasks assessed                                        Exercises General Exercises - Lower Extremity Ankle Circles/Pumps: (reviewed ankle pumps, LAQs, seated marching, shoulder flexion AROM to be done independently)    General Comments        Pertinent Vitals/Pain Faces Pain  Scale: Hurts little more Pain Location: chest, aches from muscle Pain Descriptors / Indicators: Discomfort Pain Intervention(s): Limited activity within patient's tolerance;Monitored during session;Premedicated before session    Home Living                      Prior Function            PT Goals (current goals can now be found in the care plan section) Acute Rehab PT Goals Patient Stated Goal: to get stronger so I can walk farther PT Goal Formulation: With patient Time For Goal Achievement: 09/13/18 Potential to Achieve Goals: Good Progress towards PT goals: Progressing toward goals    Frequency     Min 2X/week      PT Plan Current plan remains appropriate    Co-evaluation              AM-PAC PT "6 Clicks" Mobility   Outcome Measure  Help needed turning from your back to your side while in a flat bed without using bedrails?: A Little Help needed moving from lying on your back to sitting on the side of a flat bed without using bedrails?: A Little Help needed moving to and from a bed to a chair (including a wheelchair)?: A Little Help needed standing up from a chair using your arms (e.g., wheelchair or bedside chair)?: A Little Help needed to walk in hospital room?: A Little Help needed climbing 3-5 steps with a railing? : A Lot 6 Click Score: 17    End of Session Equipment Utilized During Treatment: Oxygen;Gait belt Activity Tolerance: Patient limited by fatigue Patient left: with call bell/phone within reach;in bed Nurse Communication: Mobility status PT Visit Diagnosis: Difficulty in walking, not elsewhere classified (R26.2)     Time: 1356-1440 PT Time Calculation (min) (ACUTE ONLY): 44 min  Charges:  $Gait Training: 8-22 mins $Therapeutic Activity: 8-22 mins                     Blondell Reveal Kistler PT 09/07/2018  Acute Rehabilitation Services Pager [phone] Office [phone]

## 2018-09-07 NOTE — Progress Notes (Addendum)
SATURATION QUALIFICATIONS: (This note is used to comply with regulatory documentation for home oxygen)  Patient Saturations on Room Air at Rest = 88%  Patient Saturations on Room Air while Ambulating = 85%  Patient Saturations on 2 Liters of oxygen while Ambulating = 98%

## 2018-09-07 NOTE — Discharge Summary (Signed)
Physician Discharge Summary  [name] White UXN:[id number] DOB: [birth year] DOA: 08/29/2018  PCP: [name], MD  Admit date: 08/29/2018 Discharge date: 09/07/2018  Admitted From: Home Disposition:  Home  Discharge Condition:Stable CODE STATUS:FULL Diet recommendation: Heart Healthy  Brief/Interim Summary: [name] White is a 63 y.o. female with a history of asthma, chronic respiratory failure with 2L O2 chronically at home, chronic back pain, depression, GERD, hypertension, hyperthyroidism, migraine, osteoarthritis, paroxysmal atrial fibrillation, and history of pulmonary embolus. She presented secondary to acute respiratory distress and found to have a COPD exacerbation. She was started on IV steroids with improvement in the respiratory status.  She was also seen by physical therapy recommended skilled skilled nursing facility on discharge but it has been denied by her insurance.  She will be discharged home with home health today with tapering dose of prednisone.  Following problems were addressed during her hospitalization:  Asthma/COPD  exacerbation Likely precipitated secondary to viral illness. Afebrile.Normal WBC. Chest x-ray suggests infiltrate vs atelectasis. Improving on IV steroids. Appears mostly upper respiratory. On chart review, patient does not have COPD. Concern for possible asthma and possible vocal cord dysfunction. Seen by speech therapy Continue protonix in case there is a GERD component. Follow-up with pulmonology outpatient,she follow with Dr Lamonte Sakai. She  can also follow-up with ENT. Continue prednisone at tapering dose.  She has inhalers at home.  Acute on chronic respiratory failure with hypoxia Patient is chronically on 2 L via nasal canula. Required 3 L on admission, now back to baseline Continue oxygen supplementation  Abnormal chest x-ray Infiltrate vs atelectasis. Likely atelectasis vs viral pneumonia.   Chronic atrial fibrillation Rate  controlled. On anticoagulation and CCC/antiarrhythmic/BB -Continue Xarelto -Continue flecainide, diltiazem, metoprolol  Right sided chest pain Reproducible. Possibly secondary to coughing vs physical therapy at SNF. Rib x-ray negative for acute fracture  Depression -Continue Effexor  GERD -Continue Protonix  History of pulmonary embolism -Continue Xarelto  Post-nasal drip Possibly contributing to above. -Continue Flonase, but increase to 2 sprays daily for 7 days -Continue Claritin daily  Calf pain Likely secondary to physical therapy. No unilateral swelling. No erythema. Patient is already on Xarelto. There is some warmth, however. Venous duplex negative.   Discharge Diagnoses:  Active Problems:   Morbid obesity (HCC)   GASTROESOPHAGEAL REFLUX, NO ESOPHAGITIS   SOB (shortness of breath)   MDD (major depressive disorder), recurrent episode, severe (HCC)   Viral illness   Atrial fibrillation, chronic   Chronic anticoagulation   History of pulmonary embolus (PE)   Major depressive disorder   COPD with acute exacerbation (HCC)   Acute bronchitis   Acute on chronic respiratory failure with hypoxia [facility])    Discharge Instructions  Discharge Instructions    Diet - low sodium heart healthy   Complete by:  As directed    Discharge instructions   Complete by:  As directed    1)Please follow up with your PCP in a week. 2) Follow up with with your pulmonologist in 2 weeks. 3)Take prescribed medications as instructed.   Increase activity slowly   Complete by:  As directed      Allergies as of 09/07/2018      Reactions   Caffeine Other (See Comments)   Migraine   Vicodin [hydrocodone-acetaminophen] Nausea And Vomiting, Other (See Comments)   Headache    Hydralazine Hcl    Other reaction(s): Other (See Comments) AKI leading to rhabdo and electrolyte abnormalities   Hydrocodone-acetaminophen Nausea And Vomiting   Ciprofloxacin Hives,  Rash   Erythromycin  Hives, Rash   Lisinopril Cough   Other reaction(s): Cough   Oxycodone Nausea And Vomiting   headache Other reaction(s): Vomiting   Sulfamethoxazole-trimethoprim Rash      Medication List    TAKE these medications   acetaminophen 650 MG CR tablet Commonly known as:  TYLENOL Take 1,300 mg by mouth daily as needed for pain.   albuterol 108 (90 Base) MCG/ACT inhaler Commonly known as:  PROVENTIL HFA;VENTOLIN HFA Inhale 2 puffs into the lungs every 6 (six) hours as needed for wheezing or shortness of breath.   albuterol (2.5 MG/3ML) 0.083% nebulizer solution Commonly known as:  PROVENTIL Take 3 mLs (2.5 mg total) by nebulization every 6 (six) hours as needed for wheezing or shortness of breath.   azelastine 0.05 % ophthalmic solution Commonly known as:  OPTIVAR Place 1 drop into both eyes daily.   budesonide-formoterol 160-4.5 MCG/ACT inhaler Commonly known as:  Symbicort Inhale 2 puffs into the lungs 2 (two) times daily.   cycloSPORINE 0.05 % ophthalmic emulsion Commonly known as:  RESTASIS Place 1 drop into both eyes 2 (two) times daily.   diclofenac sodium 1 % Gel Commonly known as:  VOLTAREN APPLY 2 GRAMS EXTERNALLY TO THE AFFECTED AREA FOUR TIMES DAILY What changed:  See the new instructions.   diltiazem 120 MG 24 hr capsule Commonly known as:  CARDIZEM CD Take 1 capsule (120 mg total) by mouth daily. Please keep upcoming appt in January before anymore refills. Final Attempt What changed:  additional instructions   flecainide 100 MG tablet Commonly known as:  TAMBOCOR Take 1 tablet (100 mg total) by mouth 2 (two) times daily. Please keep upcoming appt in January before anymore refills. Final Attempt What changed:  additional instructions   fluticasone 50 MCG/ACT nasal spray Commonly known as:  Flonase Place 1 spray into both nostrils daily.   guaiFENesin 600 MG 12 hr tablet Commonly known as:  MUCINEX Take 2 tablets (1,200 mg total) by mouth 2 (two) times  daily for 5 days.   hydrocortisone 25 MG suppository Commonly known as:  ANUSOL-HC Place 1 suppository (25 mg total) rectally 2 (two) times daily.   hydrOXYzine 25 MG tablet Commonly known as:  ATARAX/VISTARIL Take 25 mg by mouth 2 (two) times daily as needed for anxiety.   loratadine 10 MG tablet Commonly known as:  CLARITIN Take 10 mg by mouth daily.   magnesium oxide 400 (241.3 Mg) MG tablet Commonly known as:  MAG-OX Take 1 tablet (400 mg total) by mouth 2 (two) times daily.   metoprolol tartrate 25 MG tablet Commonly known as:  LOPRESSOR Take 1 tablet (25 mg total) by mouth 2 (two) times daily.   pantoprazole 40 MG tablet Commonly known as:  PROTONIX Take 1 tablet (40 mg total) by mouth 2 (two) times daily.   potassium chloride SA 20 MEQ tablet Commonly known as:  K-DUR,KLOR-CON Take 2 tablets (40 mEq total) by mouth 2 (two) times daily.   predniSONE 10 MG tablet Commonly known as:  DELTASONE Take 4 pills for 3 days then 3 pills for 3 days then 2 pills for 3 days then 1 pill for 3 days then stop   rivaroxaban 20 MG Tabs tablet Commonly known as:  Xarelto TAKE 1 TABLET(20 MG) BY MOUTH DAILY AFTER SUPPER   venlafaxine XR 150 MG 24 hr capsule Commonly known as:  EFFEXOR-XR Take 150 mg by mouth daily with breakfast.   witch hazel-glycerin pad Commonly known as:  TUCKS Apply topically as needed for itching.      Follow-up Information    [name], MD. Schedule an appointment as soon as possible for a visit in 1 week(s).   Specialty:  Family Medicine Contact information: [address] [address] [phone]          Allergies  Allergen Reactions  . Caffeine Other (See Comments)    Migraine  . Vicodin [Hydrocodone-Acetaminophen] Nausea And Vomiting and Other (See Comments)    Headache   . Hydralazine Hcl     Other reaction(s): Other (See Comments) AKI leading to rhabdo and electrolyte abnormalities  .  Hydrocodone-Acetaminophen Nausea And Vomiting  . Ciprofloxacin Hives and Rash  . Erythromycin Hives and Rash  . Lisinopril Cough    Other reaction(s): Cough  . Oxycodone Nausea And Vomiting    headache Other reaction(s): Vomiting  . Sulfamethoxazole-Trimethoprim Rash    Consultations:  None   Procedures/Studies: Dg Ribs Unilateral Right  Result Date: 08/30/2018 CLINICAL DATA:  Pt reported mid anterior right sided chest pain that started yesterday. Medical hx of asthma and HTN. EXAM: RIGHT RIBS - 2 VIEW COMPARISON:  None. FINDINGS: No displaced RIGHT rib fracture. No pneumothorax. No chest wall abnormality. IMPRESSION: No rib fracture.  No pneumothorax. Electronically Signed   By: Suzy Bouchard M.D.   On: 08/30/2018 10:28   Dg Chest Portable 1 View  Result Date: 08/29/2018 CLINICAL DATA:  Shortness of breath since 10:45 a.m. today. EXAM: PORTABLE CHEST 1 VIEW COMPARISON:  08/07/2018. FINDINGS: Poor inspiration. The most inferior portion of the left lung base is not included. The right hemidiaphragm remains mildly elevated. Mild right inferior perihilar atelectasis. Patchy airspace opacity at the left lung base. Stable borderline enlarged cardiac silhouette and tortuous and partially calcified thoracic aorta. Mild to moderate bilateral AC joint degenerative spur formation. IMPRESSION: Poor inspiration with mild right inferior perihilar atelectasis and moderate left basilar pneumonia or patchy atelectasis. Electronically Signed   By: Claudie Revering M.D.   On: 08/29/2018 14:09   Vas [place] Lower Extremity Venous (dvt)  Result Date: 09/03/2018  Lower Venous Study Indications: Edema.  Performing Technologist: Oliver Hum RVT  Examination Guidelines: A complete evaluation includes B-mode imaging, spectral Doppler, color Doppler, and power Doppler as needed of all accessible portions of each vessel. Bilateral testing is considered an integral part of a complete examination. Limited examinations for  reoccurring indications may be performed as noted.  Right Venous Findings: +---------+---------------+---------+-----------+----------+-------+          CompressibilityPhasicitySpontaneityPropertiesSummary +---------+---------------+---------+-----------+----------+-------+ CFV      Full           Yes      Yes                          +---------+---------------+---------+-----------+----------+-------+ SFJ      Full                                                 +---------+---------------+---------+-----------+----------+-------+ FV Prox  Full                                                 +---------+---------------+---------+-----------+----------+-------+ FV Mid   Full                                                 +---------+---------------+---------+-----------+----------+-------+  FV DistalFull                                                 +---------+---------------+---------+-----------+----------+-------+ PFV      Full                                                 +---------+---------------+---------+-----------+----------+-------+ POP      Full           Yes      Yes                          +---------+---------------+---------+-----------+----------+-------+ PTV      Full                                                 +---------+---------------+---------+-----------+----------+-------+ PERO     Full                                                 +---------+---------------+---------+-----------+----------+-------+  Left Venous Findings: +---------+---------------+---------+-----------+----------+-------+          CompressibilityPhasicitySpontaneityPropertiesSummary +---------+---------------+---------+-----------+----------+-------+ CFV      Full           Yes      Yes                          +---------+---------------+---------+-----------+----------+-------+ SFJ      Full                                                  +---------+---------------+---------+-----------+----------+-------+ FV Prox  Full                                                 +---------+---------------+---------+-----------+----------+-------+ FV Mid   Full                                                 +---------+---------------+---------+-----------+----------+-------+ FV DistalFull                                                 +---------+---------------+---------+-----------+----------+-------+ PFV      Full                                                 +---------+---------------+---------+-----------+----------+-------+  POP      Full           Yes      Yes                          +---------+---------------+---------+-----------+----------+-------+ PTV      Full                                                 +---------+---------------+---------+-----------+----------+-------+ PERO     Full                                                 +---------+---------------+---------+-----------+----------+-------+    Summary: Right: There is no evidence of deep vein thrombosis in the lower extremity. No cystic structure found in the popliteal fossa. Left: There is no evidence of deep vein thrombosis in the lower extremity. No cystic structure found in the popliteal fossa.  *See table(s) above for measurements and observations. Electronically signed by Ruta Hinds MD on 09/03/2018 at 5:06:48 PM.    Final        Subjective:  Patient seen and examined the bedside this morning.  Hemodynamically stable.  Respiratory status stable.  Auscultated to have mild expiratpry wheezes today but she is comfortable.  Stable for discharge to home  Discharge Exam: Vitals:   09/07/18 1010 09/07/18 1042  BP: 140/71   Pulse: 80   Resp:    Temp:    SpO2: (!) 89% 96%   Vitals:   09/06/18 2113 09/07/18 0530 09/07/18 1010 09/07/18 1042  BP:  134/73 140/71   Pulse:  78 80   Resp:  20    Temp:  98 F  (36.7 C)    TempSrc:  Oral    SpO2: 99% 100% (!) 89% 96%  Weight:      Height:        General: Pt is alert, awake, not in acute distress Cardiovascular: RRR, S1/S2 +, no rubs, no gallops Respiratory: Bilateral scattered expiratory wheezes Abdominal: Soft, NT, ND, bowel sounds + Extremities: no edema, no cyanosis    The results of significant diagnostics from this hospitalization (including imaging, microbiology, ancillary and laboratory) are listed below for reference.     Microbiology: Recent Results (from the past 240 hour(s))  MRSA PCR Screening     Status: None   Collection Time: 08/30/18  6:10 AM  Result Value Ref Range Status   MRSA by PCR NEGATIVE NEGATIVE Final    Comment:        The GeneXpert MRSA Assay (FDA approved for NASAL specimens only), is one component of a comprehensive MRSA colonization surveillance program. It is not intended to diagnose MRSA infection nor to guide or monitor treatment for MRSA infections. Performed at [facility], [place] [address], [place], [place] [address]      Labs: BNP (last 3 results) Recent Labs    07/01/18 0836  BNP 01.6   Basic Metabolic Panel: Recent Labs  Lab 09/05/18 0438  CREATININE 0.63   Liver Function Tests: No results for input(s): AST, ALT, ALKPHOS, BILITOT, PROT, ALBUMIN in the last 168 hours. No results for input(s): LIPASE, AMYLASE in the last 168 hours. No results  for input(s): AMMONIA in the last 168 hours. CBC: No results for input(s): WBC, NEUTROABS, HGB, HCT, MCV, PLT in the last 168 hours. Cardiac Enzymes: No results for input(s): CKTOTAL, CKMB, CKMBINDEX, TROPONINI in the last 168 hours. BNP: Invalid input(s): POCBNP CBG: Recent Labs  Lab 09/03/18 2117  GLUCAP 141*   D-Dimer No results for input(s): DDIMER in the last 72 hours. Hgb A1c No results for input(s): HGBA1C in the last 72 hours. Lipid Profile No results for input(s): CHOL, HDL, LDLCALC, TRIG, CHOLHDL,  LDLDIRECT in the last 72 hours. Thyroid function studies No results for input(s): TSH, T4TOTAL, T3FREE, THYROIDAB in the last 72 hours.  Invalid input(s): FREET3 Anemia work up No results for input(s): VITAMINB12, FOLATE, FERRITIN, TIBC, IRON, RETICCTPCT in the last 72 hours. Urinalysis    Component Value Date/Time   COLORURINE Yellow 10/02/2014 1645   COLORURINE YELLOW 01/31/2014 0745   APPEARANCEUR Clear 10/02/2014 1645   LABSPEC 1.018 10/02/2014 1645   PHURINE 6.0 10/02/2014 1645   PHURINE 6.5 01/31/2014 0745   GLUCOSEU Negative 10/02/2014 1645   HGBUR Negative 10/02/2014 1645   HGBUR TRACE (A) 01/31/2014 0745   BILIRUBINUR Negative 10/02/2014 1645   KETONESUR Negative 10/02/2014 1645   KETONESUR 15 (A) 01/31/2014 0745   PROTEINUR Negative 10/02/2014 1645   PROTEINUR NEGATIVE 01/31/2014 0745   UROBILINOGEN 1.0 01/31/2014 0745   NITRITE Negative 10/02/2014 1645   NITRITE NEGATIVE 01/31/2014 0745   LEUKOCYTESUR Negative 10/02/2014 1645   Sepsis Labs Invalid input(s): PROCALCITONIN,  WBC,  LACTICIDVEN Microbiology Recent Results (from the past 240 hour(s))  MRSA PCR Screening     Status: None   Collection Time: 08/30/18  6:10 AM  Result Value Ref Range Status   MRSA by PCR NEGATIVE NEGATIVE Final    Comment:        The GeneXpert MRSA Assay (FDA approved for NASAL specimens only), is one component of a comprehensive MRSA colonization surveillance program. It is not intended to diagnose MRSA infection nor to guide or monitor treatment for MRSA infections. Performed at [facility], [place] [address] [place], [place] [address]     Please note: You were cared for by a hospitalist during your hospital stay. Once you are discharged, your primary care physician will handle any further medical issues. Please note that NO REFILLS for any discharge medications will be authorized once you are discharged, as it is imperative that you return to your primary  care physician (or establish a relationship with a primary care physician if you do not have one) for your post hospital discharge needs so that they can reassess your need for medications and monitor your lab values.    Time coordinating discharge: 40 minutes  SIGNED:   Shelly Coss, MD  [facility] 09/07/2018, 11:51 AM Pager [phone]  If 7PM-7AM, please contact night-coverage [email or web address] Password [email or web address]

## 2018-09-07 NOTE — TOC Transition Note (Addendum)
Transition of Care [facility]) - CM/SW Discharge Note   Patient Details  Name: [name] White MRN: [id number] Date of Birth: 06/06/1956  [facility] CM/SW Contact:  Leeroy Cha, RN Phone Number: 09/07/2018, 12:03 PM   Clinical Narrative:    Discharged to home with home 02 , RN and P.T. Address patient is going to is the [address] [facility]  notified.  Final next level of care: [place] Barriers to Discharge: No Barriers Identified   Patient Goals and CMS Choice Patient states their goals for this hospitalization and ongoing recovery are:: refused CMS Medicare.gov Compare Post Acute Care list provided to:: Patient Choice offered to / list presented to : Patient  Discharge Placement  home                     Discharge Plan and Services   Post Acute Care Choice: Durable Medical Equipment, Home Health          DME Arranged: Oxygen DME Agency: [facility] HH Arranged: RN, PT [place] Agency: [facility]   Social Determinants of Health (SDOH) Interventions     Readmission Risk Interventions No flowsheet data found.

## 2018-09-07 NOTE — Progress Notes (Signed)
Nutrition Brief Note  Patient follow up for 3/05 COPD Protocol Consult. Patient continues to have good PO and reports 100 % of breakfast recalling pancakes, oatmeal, juice, and water. No new weights noted at this time.   Wt Readings from Last 15 Encounters:  08/29/18 136.1 kg  08/08/18 (!) 137.7 kg  07/18/18 (!) 137 kg  07/05/18 (!) 136.5 kg  07/04/18 (!) 136.5 kg  07/01/18 132.5 kg  02/09/18 136.1 kg  12/26/17 (!) 136.4 kg  11/29/17 (!) 136.9 kg  11/27/17 (!) 136.2 kg  02/04/17 117.9 kg  10/03/16 118.8 kg  09/06/15 104.8 kg  07/29/15 104.7 kg  06/26/15 104.3 kg    Body mass index is 49.92 kg/m. Patient meets criteria for morbid obesity based on current BMI.   Current diet order is HH, patient is consuming approximately 100% of meals at this time. Labs and medications reviewed.   No nutrition interventions warranted at this time. If nutrition issues arise, please consult RD.   Lajuan Lines, RD, LDN  After Hours/Weekend Pager: [phone]

## 2018-09-07 NOTE — Plan of Care (Signed)
  Problem: Health Behavior/Discharge Planning: Goal: Ability to manage health-related needs will improve Outcome: Progressing   Problem: Clinical Measurements: Goal: Ability to maintain clinical measurements within normal limits will improve Outcome: Progressing Goal: Diagnostic test results will improve Outcome: Progressing Goal: Respiratory complications will improve Outcome: Progressing   Problem: Activity: Goal: Risk for activity intolerance will decrease Outcome: Progressing   Problem: Pain Managment: Goal: General experience of comfort will improve Outcome: Progressing   Problem: Education: Goal: Knowledge of General Education information will improve Description Including pain rating scale, medication(s)/side effects and non-pharmacologic comfort measures Outcome: Progressing   Problem: Health Behavior/Discharge Planning: Goal: Ability to manage health-related needs will improve Outcome: Progressing   Problem: Clinical Measurements: Goal: Ability to maintain clinical measurements within normal limits will improve Outcome: Progressing Goal: Will remain free from infection Outcome: Progressing Goal: Cardiovascular complication will be avoided Outcome: Progressing   Problem: Coping: Goal: Level of anxiety will decrease Outcome: Progressing

## 2018-09-07 NOTE — Progress Notes (Signed)
Spoke with Anderson Malta in Physical Therapy who believes that it is unsafe to discharge patient under these conditions. Patient ambulated about 45 ft and was very short of breath and had to take multiple breaks. Patient does not have a place to go at discharge and will have to go to a hotel. PT does not feel that patient will be able to maintain ADLs independently. Notified MD and Santiago Glad with [place].

## 2018-09-07 NOTE — Progress Notes (Signed)
[place] [facility] to be D/C'd Home per MD order.  Discussed prescriptions and follow up appointments with the patient. Prescriptions given to patient, medication list explained in detail. Pt verbalized understanding.  Allergies as of 09/07/2018      Reactions   Caffeine Other (See Comments)   Migraine   Vicodin [hydrocodone-acetaminophen] Nausea And Vomiting, Other (See Comments)   Headache    Hydralazine Hcl    Other reaction(s): Other (See Comments) AKI leading to rhabdo and electrolyte abnormalities   Hydrocodone-acetaminophen Nausea And Vomiting   Ciprofloxacin Hives, Rash   Erythromycin Hives, Rash   Lisinopril Cough   Other reaction(s): Cough   Oxycodone Nausea And Vomiting   headache Other reaction(s): Vomiting   Sulfamethoxazole-trimethoprim Rash      Medication List    TAKE these medications   acetaminophen 650 MG CR tablet Commonly known as:  TYLENOL Take 1,300 mg by mouth daily as needed for pain.   albuterol 108 (90 Base) MCG/ACT inhaler Commonly known as:  PROVENTIL HFA;VENTOLIN HFA Inhale 2 puffs into the lungs every 6 (six) hours as needed for wheezing or shortness of breath.   albuterol (2.5 MG/3ML) 0.083% nebulizer solution Commonly known as:  PROVENTIL Take 3 mLs (2.5 mg total) by nebulization every 6 (six) hours as needed for wheezing or shortness of breath.   azelastine 0.05 % ophthalmic solution Commonly known as:  OPTIVAR Place 1 drop into both eyes daily.   budesonide-formoterol 160-4.5 MCG/ACT inhaler Commonly known as:  Symbicort Inhale 2 puffs into the lungs 2 (two) times daily.   cycloSPORINE 0.05 % ophthalmic emulsion Commonly known as:  RESTASIS Place 1 drop into both eyes 2 (two) times daily.   diclofenac sodium 1 % Gel Commonly known as:  VOLTAREN APPLY 2 GRAMS EXTERNALLY TO THE AFFECTED AREA FOUR TIMES DAILY What changed:  See the new instructions.   diltiazem 120 MG 24 hr capsule Commonly known as:  CARDIZEM CD Take 1 capsule  (120 mg total) by mouth daily. Please keep upcoming appt in January before anymore refills. Final Attempt What changed:  additional instructions   flecainide 100 MG tablet Commonly known as:  TAMBOCOR Take 1 tablet (100 mg total) by mouth 2 (two) times daily. Please keep upcoming appt in January before anymore refills. Final Attempt What changed:  additional instructions   fluticasone 50 MCG/ACT nasal spray Commonly known as:  Flonase Place 1 spray into both nostrils daily.   guaiFENesin 600 MG 12 hr tablet Commonly known as:  MUCINEX Take 2 tablets (1,200 mg total) by mouth 2 (two) times daily for 5 days.   hydrocortisone 25 MG suppository Commonly known as:  ANUSOL-HC Place 1 suppository (25 mg total) rectally 2 (two) times daily.   hydrOXYzine 25 MG tablet Commonly known as:  ATARAX/VISTARIL Take 25 mg by mouth 2 (two) times daily as needed for anxiety.   loratadine 10 MG tablet Commonly known as:  CLARITIN Take 10 mg by mouth daily.   magnesium oxide 400 (241.3 Mg) MG tablet Commonly known as:  MAG-OX Take 1 tablet (400 mg total) by mouth 2 (two) times daily.   metoprolol tartrate 25 MG tablet Commonly known as:  LOPRESSOR Take 1 tablet (25 mg total) by mouth 2 (two) times daily.   pantoprazole 40 MG tablet Commonly known as:  PROTONIX Take 1 tablet (40 mg total) by mouth 2 (two) times daily.   potassium chloride SA 20 MEQ tablet Commonly known as:  K-DUR,KLOR-CON Take 2 tablets (40 mEq total)  by mouth 2 (two) times daily.   predniSONE 10 MG tablet Commonly known as:  DELTASONE Take 4 pills for 3 days then 3 pills for 3 days then 2 pills for 3 days then 1 pill for 3 days then stop   rivaroxaban 20 MG Tabs tablet Commonly known as:  Xarelto TAKE 1 TABLET(20 MG) BY MOUTH DAILY AFTER SUPPER   venlafaxine XR 150 MG 24 hr capsule Commonly known as:  EFFEXOR-XR Take 150 mg by mouth daily with breakfast.   witch hazel-glycerin pad Commonly known as:  TUCKS Apply  topically as needed for itching.            Durable Medical Equipment  (From admission, onward)         Start     Ordered   09/07/18 1248  For home use only DME oxygen  Once    Question Answer Comment  Mode or (Route) Nasal cannula   Liters per Minute 2   Frequency Continuous (stationary and portable oxygen unit needed)   Oxygen conserving device Yes   Oxygen delivery system Gas      09/07/18 1248   09/07/18 1153  For home use only DME oxygen  Once    Question Answer Comment  Mode or (Route) Nasal cannula   Liters per Minute 2   Oxygen delivery system Gas      09/07/18 1153          Vitals:   09/07/18 1348 09/07/18 1427  BP: (!) 141/79   Pulse: 66   Resp: 20   Temp: 98 F (36.7 C)   SpO2: 100% 96%    Skin clean, dry and intact without evidence of skin break down, no evidence of skin tears noted. IV catheter discontinued intact. Site without signs and symptoms of complications. Dressing and pressure applied. Pt denies pain at this time. No complaints noted.  An After Visit Summary was printed and given to the patient. Patient escorted via [place], and D/C home via private auto.  Lolita Rieger 09/07/2018 4:48 PM

## 2018-09-09 ENCOUNTER — Other Ambulatory Visit: Payer: Self-pay

## 2018-09-09 ENCOUNTER — Inpatient Hospital Stay (HOSPITAL_COMMUNITY)
Admission: EM | Admit: 2018-09-09 | Discharge: 2018-09-13 | DRG: 154 | Disposition: A | Discharge status: Skilled Nursing Facility | Payer: Medicare HMO | Attending: Family Medicine | Admitting: Family Medicine

## 2018-09-09 ENCOUNTER — Emergency Department (HOSPITAL_COMMUNITY): Payer: Medicare HMO

## 2018-09-09 ENCOUNTER — Encounter (HOSPITAL_COMMUNITY): Payer: Self-pay

## 2018-09-09 DIAGNOSIS — Z881 Allergy status to other antibiotic agents status: Secondary | ICD-10-CM

## 2018-09-09 DIAGNOSIS — Z87891 Personal history of nicotine dependence: Secondary | ICD-10-CM | POA: Diagnosis not present

## 2018-09-09 DIAGNOSIS — I10 Essential (primary) hypertension: Secondary | ICD-10-CM | POA: Diagnosis present

## 2018-09-09 DIAGNOSIS — G8929 Other chronic pain: Secondary | ICD-10-CM | POA: Diagnosis not present

## 2018-09-09 DIAGNOSIS — I482 Chronic atrial fibrillation, unspecified: Secondary | ICD-10-CM | POA: Diagnosis present

## 2018-09-09 DIAGNOSIS — J9621 Acute and chronic respiratory failure with hypoxia: Secondary | ICD-10-CM | POA: Diagnosis present

## 2018-09-09 DIAGNOSIS — I480 Paroxysmal atrial fibrillation: Secondary | ICD-10-CM | POA: Diagnosis present

## 2018-09-09 DIAGNOSIS — T380X5A Adverse effect of glucocorticoids and synthetic analogues, initial encounter: Secondary | ICD-10-CM | POA: Diagnosis present

## 2018-09-09 DIAGNOSIS — R0982 Postnasal drip: Secondary | ICD-10-CM | POA: Diagnosis present

## 2018-09-09 DIAGNOSIS — Z882 Allergy status to sulfonamides status: Secondary | ICD-10-CM

## 2018-09-09 DIAGNOSIS — J449 Chronic obstructive pulmonary disease, unspecified: Secondary | ICD-10-CM | POA: Diagnosis not present

## 2018-09-09 DIAGNOSIS — Z885 Allergy status to narcotic agent status: Secondary | ICD-10-CM | POA: Diagnosis not present

## 2018-09-09 DIAGNOSIS — Z8249 Family history of ischemic heart disease and other diseases of the circulatory system: Secondary | ICD-10-CM

## 2018-09-09 DIAGNOSIS — G4730 Sleep apnea, unspecified: Secondary | ICD-10-CM | POA: Diagnosis present

## 2018-09-09 DIAGNOSIS — F419 Anxiety disorder, unspecified: Secondary | ICD-10-CM | POA: Diagnosis present

## 2018-09-09 DIAGNOSIS — G43909 Migraine, unspecified, not intractable, without status migrainosus: Secondary | ICD-10-CM | POA: Diagnosis not present

## 2018-09-09 DIAGNOSIS — M159 Polyosteoarthritis, unspecified: Secondary | ICD-10-CM | POA: Diagnosis present

## 2018-09-09 DIAGNOSIS — Z825 Family history of asthma and other chronic lower respiratory diseases: Secondary | ICD-10-CM | POA: Diagnosis not present

## 2018-09-09 DIAGNOSIS — R0602 Shortness of breath: Secondary | ICD-10-CM | POA: Diagnosis present

## 2018-09-09 DIAGNOSIS — Z888 Allergy status to other drugs, medicaments and biological substances status: Secondary | ICD-10-CM | POA: Diagnosis not present

## 2018-09-09 DIAGNOSIS — J45909 Unspecified asthma, uncomplicated: Secondary | ICD-10-CM | POA: Diagnosis present

## 2018-09-09 DIAGNOSIS — R062 Wheezing: Secondary | ICD-10-CM | POA: Diagnosis not present

## 2018-09-09 DIAGNOSIS — J441 Chronic obstructive pulmonary disease with (acute) exacerbation: Secondary | ICD-10-CM | POA: Diagnosis not present

## 2018-09-09 DIAGNOSIS — Z86711 Personal history of pulmonary embolism: Secondary | ICD-10-CM | POA: Diagnosis not present

## 2018-09-09 DIAGNOSIS — Z7901 Long term (current) use of anticoagulants: Secondary | ICD-10-CM | POA: Diagnosis not present

## 2018-09-09 DIAGNOSIS — M170 Bilateral primary osteoarthritis of knee: Secondary | ICD-10-CM | POA: Diagnosis not present

## 2018-09-09 DIAGNOSIS — Z7401 Bed confinement status: Secondary | ICD-10-CM | POA: Diagnosis not present

## 2018-09-09 DIAGNOSIS — S2231XA Fracture of one rib, right side, initial encounter for closed fracture: Secondary | ICD-10-CM | POA: Diagnosis not present

## 2018-09-09 DIAGNOSIS — J9611 Chronic respiratory failure with hypoxia: Secondary | ICD-10-CM | POA: Diagnosis not present

## 2018-09-09 DIAGNOSIS — Z803 Family history of malignant neoplasm of breast: Secondary | ICD-10-CM | POA: Diagnosis not present

## 2018-09-09 DIAGNOSIS — Z9981 Dependence on supplemental oxygen: Secondary | ICD-10-CM

## 2018-09-09 DIAGNOSIS — F329 Major depressive disorder, single episode, unspecified: Secondary | ICD-10-CM | POA: Diagnosis present

## 2018-09-09 DIAGNOSIS — J383 Other diseases of vocal cords: Secondary | ICD-10-CM | POA: Diagnosis present

## 2018-09-09 DIAGNOSIS — M545 Low back pain: Secondary | ICD-10-CM | POA: Diagnosis present

## 2018-09-09 DIAGNOSIS — Z6841 Body Mass Index (BMI) 40.0 and over, adult: Secondary | ICD-10-CM | POA: Diagnosis not present

## 2018-09-09 DIAGNOSIS — I340 Nonrheumatic mitral (valve) insufficiency: Secondary | ICD-10-CM | POA: Diagnosis not present

## 2018-09-09 DIAGNOSIS — Z7951 Long term (current) use of inhaled steroids: Secondary | ICD-10-CM | POA: Diagnosis not present

## 2018-09-09 DIAGNOSIS — I4891 Unspecified atrial fibrillation: Secondary | ICD-10-CM | POA: Diagnosis not present

## 2018-09-09 DIAGNOSIS — I4892 Unspecified atrial flutter: Secondary | ICD-10-CM | POA: Diagnosis not present

## 2018-09-09 DIAGNOSIS — I361 Nonrheumatic tricuspid (valve) insufficiency: Secondary | ICD-10-CM | POA: Diagnosis not present

## 2018-09-09 DIAGNOSIS — M2550 Pain in unspecified joint: Secondary | ICD-10-CM | POA: Diagnosis not present

## 2018-09-09 DIAGNOSIS — K219 Gastro-esophageal reflux disease without esophagitis: Secondary | ICD-10-CM | POA: Diagnosis present

## 2018-09-09 DIAGNOSIS — E876 Hypokalemia: Secondary | ICD-10-CM | POA: Diagnosis present

## 2018-09-09 DIAGNOSIS — R000 Tachycardia, unspecified: Secondary | ICD-10-CM | POA: Diagnosis not present

## 2018-09-09 DIAGNOSIS — R0689 Other abnormalities of breathing: Secondary | ICD-10-CM | POA: Diagnosis not present

## 2018-09-09 DIAGNOSIS — I491 Atrial premature depolarization: Secondary | ICD-10-CM | POA: Diagnosis not present

## 2018-09-09 LAB — INFLUENZA PANEL BY PCR (TYPE A & B)
INFLBPCR: NEGATIVE
Influenza A By PCR: NEGATIVE

## 2018-09-09 LAB — BASIC METABOLIC PANEL
Anion gap: 10 (ref 5–15)
BUN: 16 mg/dL (ref 8–23)
CHLORIDE: 105 mmol/L (ref 98–111)
CO2: 25 mmol/L (ref 22–32)
Calcium: 8.6 mg/dL — ABNORMAL LOW (ref 8.9–10.3)
Creatinine, Ser: 0.74 mg/dL (ref 0.44–1.00)
GFR calc Af Amer: >60 mL/min (ref >60)
GFR calc non Af Amer: >60 mL/min (ref >60)
Glucose, Bld: 225 mg/dL — ABNORMAL HIGH (ref 70–99)
Potassium: 3.8 mmol/L (ref 3.5–5.1)
Sodium: 140 mmol/L (ref 135–145)

## 2018-09-09 LAB — CBC WITH DIFFERENTIAL/PLATELET
Abs Immature Granulocytes: 0.3 10*3/uL — ABNORMAL HIGH (ref 0.00–0.07)
Basophils Absolute: 0.1 10*3/uL (ref 0.0–0.1)
Basophils Relative: 1 %
Eosinophils Absolute: 0 10*3/uL (ref 0.0–0.5)
Eosinophils Relative: 0 %
HEMATOCRIT: 40.2 % (ref 36.0–46.0)
Hemoglobin: 12.3 g/dL (ref 12.0–15.0)
Immature Granulocytes: 3 %
Lymphocytes Relative: 13 %
Lymphs Abs: 1.5 10*3/uL (ref 0.7–4.0)
MCH: 30.1 pg (ref 26.0–34.0)
MCHC: 30.6 g/dL (ref 30.0–36.0)
MCV: 98.3 fL (ref 80.0–100.0)
MONO ABS: 0.4 10*3/uL (ref 0.1–1.0)
Monocytes Relative: 3 %
Neutro Abs: 9.1 10*3/uL — ABNORMAL HIGH (ref 1.7–7.7)
Neutrophils Relative %: 80 %
Platelets: 505 10*3/uL — ABNORMAL HIGH (ref 150–400)
RBC: 4.09 MIL/uL (ref 3.87–5.11)
RDW: 16.2 % — ABNORMAL HIGH (ref 11.5–15.5)
WBC: 11.3 10*3/uL — ABNORMAL HIGH (ref 4.0–10.5)
nRBC: 0 % (ref 0.0–0.2)

## 2018-09-09 LAB — BRAIN NATRIURETIC PEPTIDE: B NATRIURETIC PEPTIDE 5: 142.6 pg/mL — AB (ref 0.0–100.0)

## 2018-09-09 LAB — GLUCOSE, CAPILLARY: Glucose-Capillary: 160 mg/dL — ABNORMAL HIGH (ref 70–99)

## 2018-09-09 LAB — I-STAT TROPONIN, ED: Troponin i, poc: 0.02 ng/mL (ref 0.00–0.08)

## 2018-09-09 MED ORDER — INSULIN ASPART 100 UNIT/ML ~~LOC~~ SOLN
0.0000 [IU] | Freq: Three times a day (TID) | SUBCUTANEOUS | Status: DC
  Administered 2018-09-10: 3 [IU] via SUBCUTANEOUS
  Administered 2018-09-10: 5 [IU] via SUBCUTANEOUS
  Administered 2018-09-10 – 2018-09-11 (×2): 3 [IU] via SUBCUTANEOUS
  Administered 2018-09-11 (×2): 5 [IU] via SUBCUTANEOUS
  Administered 2018-09-12: 3 [IU] via SUBCUTANEOUS
  Administered 2018-09-12: 2 [IU] via SUBCUTANEOUS
  Administered 2018-09-13 (×2): 3 [IU] via SUBCUTANEOUS

## 2018-09-09 MED ORDER — INSULIN ASPART 100 UNIT/ML ~~LOC~~ SOLN
0.0000 [IU] | Freq: Every day | SUBCUTANEOUS | Status: DC
  Administered 2018-09-10: 3 [IU] via SUBCUTANEOUS
  Administered 2018-09-11 – 2018-09-12 (×2): 2 [IU] via SUBCUTANEOUS

## 2018-09-09 MED ORDER — IPRATROPIUM-ALBUTEROL 0.5-2.5 (3) MG/3ML IN SOLN
3.0000 mL | RESPIRATORY_TRACT | Status: AC
  Administered 2018-09-09 (×3): 3 mL via RESPIRATORY_TRACT
  Filled 2018-09-09: qty 9

## 2018-09-09 MED ORDER — DILTIAZEM HCL 30 MG PO TABS
30.0000 mg | ORAL_TABLET | Freq: Three times a day (TID) | ORAL | Status: DC
  Administered 2018-09-09 – 2018-09-13 (×12): 30 mg via ORAL
  Filled 2018-09-09 (×12): qty 1

## 2018-09-09 MED ORDER — HYDROXYZINE HCL 25 MG PO TABS
25.0000 mg | ORAL_TABLET | Freq: Two times a day (BID) | ORAL | Status: DC | PRN
  Administered 2018-09-09 – 2018-09-12 (×4): 25 mg via ORAL
  Filled 2018-09-09 (×4): qty 1

## 2018-09-09 MED ORDER — DICLOFENAC SODIUM 1 % TD GEL
2.0000 g | Freq: Four times a day (QID) | TRANSDERMAL | Status: DC
  Administered 2018-09-10 – 2018-09-13 (×5): 2 g via TOPICAL
  Filled 2018-09-09: qty 100

## 2018-09-09 MED ORDER — METOPROLOL TARTRATE 25 MG PO TABS
25.0000 mg | ORAL_TABLET | Freq: Two times a day (BID) | ORAL | Status: DC
  Administered 2018-09-09 – 2018-09-13 (×8): 25 mg via ORAL
  Filled 2018-09-09 (×8): qty 1

## 2018-09-09 MED ORDER — VENLAFAXINE HCL ER 150 MG PO CP24
150.0000 mg | ORAL_CAPSULE | Freq: Every day | ORAL | Status: DC
  Administered 2018-09-10 – 2018-09-13 (×4): 150 mg via ORAL
  Filled 2018-09-09 (×4): qty 1

## 2018-09-09 MED ORDER — ORAL CARE MOUTH RINSE
15.0000 mL | Freq: Two times a day (BID) | OROMUCOSAL | Status: DC
  Administered 2018-09-09 – 2018-09-10 (×2): 15 mL via OROMUCOSAL

## 2018-09-09 MED ORDER — BENZONATATE 100 MG PO CAPS
100.0000 mg | ORAL_CAPSULE | Freq: Once | ORAL | Status: AC
  Administered 2018-09-09: 100 mg via ORAL
  Filled 2018-09-09: qty 1

## 2018-09-09 MED ORDER — METHYLPREDNISOLONE SODIUM SUCC 125 MG IJ SOLR
125.0000 mg | Freq: Once | INTRAMUSCULAR | Status: AC
  Administered 2018-09-09: 125 mg via INTRAVENOUS
  Filled 2018-09-09: qty 2

## 2018-09-09 MED ORDER — MAGNESIUM SULFATE 2 GM/50ML IV SOLN
2.0000 g | Freq: Once | INTRAVENOUS | Status: AC
  Administered 2018-09-09: 2 g via INTRAVENOUS
  Filled 2018-09-09: qty 50

## 2018-09-09 MED ORDER — LORATADINE 10 MG PO TABS
10.0000 mg | ORAL_TABLET | Freq: Every day | ORAL | Status: DC
  Administered 2018-09-09 – 2018-09-13 (×5): 10 mg via ORAL
  Filled 2018-09-09 (×5): qty 1

## 2018-09-09 MED ORDER — FLUTICASONE PROPIONATE 50 MCG/ACT NA SUSP
1.0000 | Freq: Every day | NASAL | Status: DC
  Administered 2018-09-09 – 2018-09-13 (×5): 1 via NASAL
  Filled 2018-09-09: qty 16

## 2018-09-09 MED ORDER — RIVAROXABAN 20 MG PO TABS
20.0000 mg | ORAL_TABLET | Freq: Every day | ORAL | Status: DC
  Administered 2018-09-09 – 2018-09-13 (×5): 20 mg via ORAL
  Filled 2018-09-09 (×5): qty 1

## 2018-09-09 MED ORDER — IPRATROPIUM-ALBUTEROL 0.5-2.5 (3) MG/3ML IN SOLN
3.0000 mL | RESPIRATORY_TRACT | Status: DC
  Administered 2018-09-10: 3 mL via RESPIRATORY_TRACT
  Filled 2018-09-09: qty 3

## 2018-09-09 MED ORDER — MAGNESIUM OXIDE 400 (241.3 MG) MG PO TABS
400.0000 mg | ORAL_TABLET | Freq: Two times a day (BID) | ORAL | Status: DC
  Administered 2018-09-09 – 2018-09-13 (×8): 400 mg via ORAL
  Filled 2018-09-09 (×8): qty 1

## 2018-09-09 MED ORDER — FLECAINIDE ACETATE 100 MG PO TABS
100.0000 mg | ORAL_TABLET | Freq: Two times a day (BID) | ORAL | Status: DC
  Administered 2018-09-09 – 2018-09-13 (×8): 100 mg via ORAL
  Filled 2018-09-09 (×9): qty 1

## 2018-09-09 MED ORDER — METHYLPREDNISOLONE SODIUM SUCC 125 MG IJ SOLR
60.0000 mg | Freq: Four times a day (QID) | INTRAMUSCULAR | Status: DC
  Administered 2018-09-09 – 2018-09-11 (×7): 60 mg via INTRAVENOUS
  Filled 2018-09-09 (×7): qty 2

## 2018-09-09 MED ORDER — PANTOPRAZOLE SODIUM 40 MG PO TBEC
40.0000 mg | DELAYED_RELEASE_TABLET | Freq: Two times a day (BID) | ORAL | Status: DC
  Administered 2018-09-09 – 2018-09-13 (×8): 40 mg via ORAL
  Filled 2018-09-09 (×8): qty 1

## 2018-09-09 MED ORDER — SODIUM CHLORIDE 0.9% FLUSH
3.0000 mL | Freq: Two times a day (BID) | INTRAVENOUS | Status: DC
  Administered 2018-09-09 – 2018-09-13 (×7): 3 mL via INTRAVENOUS

## 2018-09-09 NOTE — ED Provider Notes (Signed)
[facility] DEPT Provider Note   CSN: [id number] Arrival date & time: 09/09/18  1832    History   Chief Complaint Chief Complaint  Patient presents with  . Shortness of Breath  . COPD    HPI [name] White is a 63 y.o. female.     63 yo F with a chief complaint of shortness of breath.  Patient was just in the hospital for a COPD exacerbation since she has been discharged 2 days ago she has had worsening of her symptoms.  Has had increased cough increased sputum and change in sputum.  Has had some chest pain that is worse with inspiration, describes it as sharp.  She denies fevers denies chills denies myalgias.  The history is provided by the patient.  Shortness of Breath  Severity:  Severe Onset quality:  Gradual Duration:  2 days Timing:  Constant Progression:  Worsening Chronicity:  Recurrent Relieved by:  Nothing Ineffective treatments:  Inhaler Associated symptoms: chest pain (with breathing) and cough   Associated symptoms: no fever, no headaches, no vomiting and no wheezing   COPD  Associated symptoms include chest pain (with breathing) and shortness of breath. Pertinent negatives include no headaches.    Past Medical History:  Diagnosis Date  . Anxiety   . Asthma   . Chronic lower back pain   . Depression   . Family history of anesthesia complication    "daughter; causes her to pass out afterwards"  . GERD (gastroesophageal reflux disease)   . Hypertension   . Hyperthyroidism   . Migraine    "monthly" (12/28/2013)  . Osteoarthritis    "both knees; back of my neck; right pelvic bone" (12/28/2013)  . Paroxysmal A-fib ([place])   . Pulmonary embolism ([place]) 12/28/2013   "2 clots in each lung"    Patient Active Problem List   Diagnosis Date Noted  . COPD with acute exacerbation ([place]) 08/29/2018  . Acute bronchitis 08/29/2018  . Acute on chronic respiratory failure with hypoxia ([place]) 08/29/2018  . Sleep apnea 07/05/2018  .  Chronic respiratory failure with hypoxia ([place]) 07/05/2018  . Osteoarthritis 03/02/2018  . Viral illness 02/13/2018  . Nausea 02/13/2018  . Left medial tibial stress syndrome 12/26/2017  . Hematoma 11/27/2017  . Severe episode of recurrent major depressive disorder, without psychotic features ([place])   . MDD (major depressive disorder), recurrent episode, severe ([place]) 09/07/2015  . Atrial flutter ([place]) 07/29/2015  . History of atrial flutter 06/26/2015  . Chest pain 03/22/2015  . Asthma 11/12/2014  . History of hypertension 11/05/2014  . Atrial fibrillation, chronic 11/03/2014  . Chronic anticoagulation 11/03/2014  . History of pulmonary embolus (PE) 11/03/2014  . Non-traumatic rhabdomyolysis 11/03/2014  . Major depressive disorder 11/03/2014  . SOB (shortness of breath)   . MDD (major depressive disorder), recurrent severe, without psychosis ([place]) 03/05/2014  . Pulmonary embolism ([place]) 12/27/2013  . Vocal cord dysfunction 11/12/2013  . Mixed headache 01/06/2012  . OVERACTIVE BLADDER 04/18/2008  . Osteoarthritis of both knees 04/18/2008  . POLYARTHRITIS 09/14/2007  . Morbid obesity ([place]) 08/24/2006  . RESTLESS LEGS SYNDROME 08/24/2006  . HYPERTENSION, BENIGN SYSTEMIC 08/24/2006  . RHINITIS, ALLERGIC 08/24/2006  . GASTROESOPHAGEAL REFLUX, NO ESOPHAGITIS 08/24/2006    Past Surgical History:  Procedure Laterality Date  . ABDOMINAL HYSTERECTOMY    . APPENDECTOMY    . BREAST CYST EXCISION Right   . DILATION AND CURETTAGE OF UTERUS    . ELECTROPHYSIOLOGIC STUDY N/A 05/27/2015   Procedure: A-Flutter;  Surgeon: Evans Lance, MD;  Location: [facility];  Service: Cardiovascular;  Laterality: N/A;  . EXCISIONAL HEMORRHOIDECTOMY    . NASAL SINUS SURGERY  2007  . THYROIDECTOMY, PARTIAL Right 2005  . TUBAL LIGATION    . WISDOM TOOTH EXTRACTION       OB History   No obstetric history on file.      Home Medications    Prior to Admission medications   Medication Sig  Start Date End Date Taking? Authorizing Provider  acetaminophen (TYLENOL) 650 MG CR tablet Take 1,300 mg by mouth daily as needed for pain.    [provider]  albuterol (PROVENTIL HFA;VENTOLIN HFA) 108 (90 Base) MCG/ACT inhaler Inhale 2 puffs into the lungs every 6 (six) hours as needed for wheezing or shortness of breath. 11/29/17   Lockamy, Christia Reading, DO  albuterol (PROVENTIL) (2.5 MG/3ML) 0.083% nebulizer solution Take 3 mLs (2.5 mg total) by nebulization every 6 (six) hours as needed for wheezing or shortness of breath. 07/09/18   Collene Gobble, MD  azelastine (OPTIVAR) 0.05 % ophthalmic solution Place 1 drop into both eyes daily. 08/02/18   [provider]  budesonide-formoterol (SYMBICORT) 160-4.5 MCG/ACT inhaler Inhale 2 puffs into the lungs 2 (two) times daily. 07/03/18   Nita Sells, MD  cycloSPORINE (RESTASIS) 0.05 % ophthalmic emulsion Place 1 drop into both eyes 2 (two) times daily.    [provider]  diclofenac sodium (VOLTAREN) 1 % GEL APPLY 2 GRAMS EXTERNALLY TO THE AFFECTED AREA FOUR TIMES DAILY Patient taking differently: Apply 2 g topically 4 (four) times daily.  07/31/18   [name], MD  diltiazem (CARDIZEM CD) 120 MG 24 hr capsule Take 1 capsule (120 mg total) by mouth daily. Please keep upcoming appt in January before anymore refills. Final Attempt Patient taking differently: Take 120 mg by mouth daily.  07/04/18   Patsey Berthold, NP  flecainide (TAMBOCOR) 100 MG tablet Take 1 tablet (100 mg total) by mouth 2 (two) times daily. Please keep upcoming appt in January before anymore refills. Final Attempt Patient taking differently: Take 100 mg by mouth 2 (two) times daily.  07/04/18   Seiler, Amber K, NP  fluticasone (FLONASE) 50 MCG/ACT nasal spray Place 1 spray into both nostrils daily. 07/03/18 08/29/18  Nita Sells, MD  guaiFENesin (MUCINEX) 600 MG 12 hr tablet Take 2 tablets (1,200 mg total) by mouth 2 (two) times daily for 5 days. 09/07/18  09/12/18  Shelly Coss, MD  hydrocortisone (ANUSOL-HC) 25 MG suppository Place 1 suppository (25 mg total) rectally 2 (two) times daily. 08/20/18   [place] Donnamarie Poag, DO  hydrOXYzine (ATARAX/VISTARIL) 25 MG tablet Take 25 mg by mouth 2 (two) times daily as needed for anxiety.    [provider]  loratadine (CLARITIN) 10 MG tablet Take 10 mg by mouth daily.    [provider]  magnesium oxide (MAG-OX) 400 (241.3 Mg) MG tablet Take 1 tablet (400 mg total) by mouth 2 (two) times daily. 08/20/18   [place] Donnamarie Poag, DO  metoprolol tartrate (LOPRESSOR) 25 MG tablet Take 1 tablet (25 mg total) by mouth 2 (two) times daily. 08/20/18   [place] Eric J, DO  pantoprazole (PROTONIX) 40 MG tablet Take 1 tablet (40 mg total) by mouth 2 (two) times daily. 08/20/18   [place] Donnamarie Poag, DO  potassium chloride SA (K-DUR,KLOR-CON) 20 MEQ tablet Take 2 tablets (40 mEq total) by mouth 2 (two) times daily. 08/20/18   [place] Eric J, DO  predniSONE (DELTASONE) 10 MG tablet Take 4 pills for 3 days then 3 pills for 3 days then 2 pills for 3 days then 1 pill for 3 days then stop 09/07/18   Shelly Coss, MD  rivaroxaban (XARELTO) 20 MG TABS tablet TAKE 1 TABLET(20 MG) BY MOUTH DAILY AFTER SUPPER 07/04/18   Chanetta Marshall K, NP  venlafaxine XR (EFFEXOR-XR) 150 MG 24 hr capsule Take 150 mg by mouth daily with breakfast.    [provider]  witch hazel-glycerin (TUCKS) pad Apply topically as needed for itching. 08/20/18   [place] Eric J, DO    Family History Family History  Problem Relation Age of Onset  . Osteoarthritis Mother   . Asthma Mother   . Heart failure Mother   . Breast cancer Daughter     Social History Social History   Tobacco Use  . Smoking status: Former Smoker    Packs/day: 0.25    Years: 20.00    Pack years: 5.00    Types: Cigarettes    Last attempt to quit: 07/17/1999    Years since quitting: 19.1  . Smokeless tobacco: Never Used  Substance Use Topics  . Alcohol use: Not  Currently    Comment: "drank some in my 30's"  . Drug use: No     Allergies   Caffeine; Vicodin [hydrocodone-acetaminophen]; Hydralazine hcl; Hydrocodone-acetaminophen; Ciprofloxacin; Erythromycin; Lisinopril; Oxycodone; and Sulfamethoxazole-trimethoprim   Review of Systems Review of Systems  Constitutional: Negative for chills and fever.  HENT: Positive for congestion. Negative for rhinorrhea.   Eyes: Negative for redness and visual disturbance.  Respiratory: Positive for cough and shortness of breath. Negative for wheezing.   Cardiovascular: Positive for chest pain (with breathing). Negative for palpitations.  Gastrointestinal: Negative for nausea and vomiting.  Genitourinary: Negative for dysuria and urgency.  Musculoskeletal: Negative for arthralgias and myalgias.  Skin: Negative for pallor and wound.  Neurological: Negative for dizziness and headaches.     Physical Exam Updated Vital Signs BP (!) 155/139   Temp 99.7 F (37.6 C) (Oral)   Resp (!) 25   SpO2 96%   Physical Exam Vitals signs and nursing note reviewed.  Constitutional:      General: She is not in acute distress.    Appearance: She is well-developed. She is not diaphoretic.  HENT:     Head: Normocephalic and atraumatic.  Eyes:     Pupils: Pupils are equal, round, and reactive to light.  Neck:     Musculoskeletal: Normal range of motion and neck supple.  Cardiovascular:     Rate and Rhythm: Normal rate and regular rhythm.     Heart sounds: No murmur. No friction rub. No gallop.   Pulmonary:     Effort: Pulmonary effort is normal. Tachypnea present.     Breath sounds: Wheezing (diffuse with prolonged expiration) present. No rales.  Abdominal:     General: There is no distension.     Palpations: Abdomen is soft.     Tenderness: There is no abdominal tenderness.  Musculoskeletal:        General: No tenderness.  Skin:    General: Skin is warm and dry.  Neurological:     Mental Status: She is alert  and oriented to person, place, and time.  Psychiatric:        Behavior: Behavior normal.      ED Treatments / Results  Labs (all labs ordered are listed, but only abnormal results are displayed) Labs Reviewed  CBC WITH DIFFERENTIAL/PLATELET -  Abnormal; Notable for the following components:      Result Value   WBC 11.3 (*)    RDW 16.2 (*)    Platelets 505 (*)    Neutro Abs 9.1 (*)    Abs Immature Granulocytes 0.30 (*)    All other components within normal limits  BASIC METABOLIC PANEL - Abnormal; Notable for the following components:   Glucose, Bld 225 (*)    Calcium 8.6 (*)    All other components within normal limits  INFLUENZA PANEL BY PCR (TYPE A & B)  BRAIN NATRIURETIC PEPTIDE  I-STAT TROPONIN, ED    EKG None  Radiology Dg Chest Port 1 View  Result Date: 09/09/2018 CLINICAL DATA:  Shortness of breath EXAM: PORTABLE CHEST 1 VIEW COMPARISON:  August 29, 2018 FINDINGS: There is atelectatic change in the left lower lobe. There is no edema or consolidation. Heart is upper normal in size with pulmonary vascularity normal. No adenopathy. There is aortic atherosclerosis. No bone lesions. IMPRESSION: Left lower lobe atelectatic change. No edema or consolidation. Stable cardiac silhouette. Aortic Atherosclerosis (ICD10-I70.0). Electronically Signed   By: Lowella Grip III M.D.   On: 09/09/2018 19:32    Procedures Procedures (including critical care time)  Medications Ordered in ED Medications  ipratropium-albuterol (DUONEB) 0.5-2.5 (3) MG/3ML nebulizer solution 3 mL (3 mLs Nebulization Given 09/09/18 1909)  methylPREDNISolone sodium succinate (SOLU-MEDROL) 125 mg/2 mL injection 125 mg (125 mg Intravenous Given 09/09/18 1909)  benzonatate (TESSALON) capsule 100 mg (100 mg Oral Given 09/09/18 1914)  magnesium sulfate IVPB 2 g 50 mL (0 g Intravenous Stopped 09/09/18 1957)     Initial Impression / Assessment and Plan / ED Course  I have reviewed the triage vital signs and the  nursing notes.  Pertinent labs & imaging results that were available during my care of the patient were reviewed by me and considered in my medical decision making (see chart for details).        63 yo F with a chief complaint of cough and shortness of breath.  This been going on for the past few days.  She was recently in the hospital for a COPD exacerbation and was discharged 2 days ago.  Is her symptoms gotten worse.  She was unable to use her nebulizer at home and has been trying her inhaler without improvement.  Patient is having diffuse wheezes on my exam and is tachypneic.  We will give 3 duo nebs back-to-back steroids and magnesium and reassess.  The patient is breathing much easier on reassessment.  She has significant improvement of air movement.  She however is still having persistent wheezing and tachypnea.  Her heart rate at times goes into the 150s.  Sinus tach.  Will discuss with the hospitalist admission.  CRITICAL CARE Performed by: Cecilio Asper   Total critical care time: 35 minutes  Critical care time was exclusive of separately billable procedures and treating other patients.  Critical care was necessary to treat or prevent imminent or life-threatening deterioration.  Critical care was time spent personally by me on the following activities: development of treatment plan with patient and/or surrogate as well as nursing, discussions with consultants, evaluation of patient's response to treatment, examination of patient, obtaining history from patient or surrogate, ordering and performing treatments and interventions, ordering and review of laboratory studies, ordering and review of radiographic studies, pulse oximetry and re-evaluation of patient's condition.  The patients results and plan were reviewed and discussed.   Any x-rays performed  were independently reviewed by myself.   Differential diagnosis were considered with the presenting HPI.  Medications   ipratropium-albuterol (DUONEB) 0.5-2.5 (3) MG/3ML nebulizer solution 3 mL (3 mLs Nebulization Given 09/09/18 1909)  methylPREDNISolone sodium succinate (SOLU-MEDROL) 125 mg/2 mL injection 125 mg (125 mg Intravenous Given 09/09/18 1909)  benzonatate (TESSALON) capsule 100 mg (100 mg Oral Given 09/09/18 1914)  magnesium sulfate IVPB 2 g 50 mL (0 g Intravenous Stopped 09/09/18 1957)    Vitals:   09/09/18 1848  BP: (!) 155/139  Resp: (!) 25  Temp: 99.7 F (37.6 C)  TempSrc: Oral  SpO2: 96%    Final diagnoses:  COPD exacerbation ([place])    Admission/ observation were discussed with the admitting physician, patient and/or family and they are comfortable with the plan.   Final Clinical Impressions(s) / ED Diagnoses   Final diagnoses:  COPD exacerbation [facility])    ED Discharge Orders    None       Deno Etienne, DO 09/09/18 2022

## 2018-09-09 NOTE — H&P (Signed)
History and Physical   [name] White:[id number] DOB: 06/05/1956 DOA: 09/09/2018  PCP: [name], MD  Chief Complaint: Shortness of breath and wheezing  History is obtained via chart review and emergency medicine and patient provided information.  HPI: This is a 63 year old woman with medical problems including chronic hypoxic respiratory failure with a 2 L oxygen requirement, asthma leading to disability, proxysmal atrial fibrillation on anticoagulation with Xarelto, prior pulmonary embolus in 2015, chronic back pain, and anxiety presenting with wheezing and shortness of breath.  The patient has been admitted multiple times for respiratory distress since January 2020.  The patient was admitted recently from March 4 to September 07, 2018 where she was treated for an asthma exacerbation.  Since being discharged, the patient reports persistent wheezing and shortness of breath.  She reports she does not have adequate financial resources for food and shelter.  She was staying at a motel and ordered food, but became so dyspneic walking to the door she sought medical attention.  Associated symptoms include wheezing as well as clear productive cough for about 1 month.  Also reports lightheadedness as well as right-sided pleuritic chest pain.  She denies nausea, vomiting, diarrhea.  Other symptoms reported include obesity, chronic joint pain.  She reports she has not had her Symbicort, but has been using a nebulizer as needed.  She has been taking prednisone 40 mg daily since being discharged.  She reports she has been taking her Xarelto nightly with dinner.  No bleeding reported.  She reports she is never been intubated, became disabled in 2006 she reports on account of her asthma.  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  She reports prior to her hospitalizations she is living with her mother, believes that the best place for her to live long-term would be with her daughter who lives  in [place], [place].  ED Course:  In the emergency department vital signs remarkable for increased respiratory rate up to 29, heart rate peak of 585, systolic blood pressure ranging from 100-155.  Temperature normal.  O2 saturation on 2 L nasal cannula within normal limits.  Chest x-ray revealed atelectatic change in the left lower lobe, no edema or consolidation.  CBC with white count of 11.3, neutrophil predominant, platelet count of 505.  BMP with glucose of 225.  BNP of 143.  Troponin within normal limits.  The patient was given multiple courses of nebulized bronchodilators, 125 mg of methylprednisolone, and IV magnesium.  Negative test for influenza.  Due to persistent respiratory distress, hospital medicine was consulted for further management.  Review of Systems: A complete ROS was obtained; pertinent positives negatives are denoted in the HPI. Otherwise, all systems are negative.   Past Medical History:  Diagnosis Date  . Anxiety   . Asthma   . Chronic lower back pain   . Depression   . Family history of anesthesia complication    "daughter; causes her to pass out afterwards"  . GERD (gastroesophageal reflux disease)   . Hypertension   . Hyperthyroidism   . Migraine    "monthly" (12/28/2013)  . Osteoarthritis    "both knees; back of my neck; right pelvic bone" (12/28/2013)  . Paroxysmal A-fib ([place])   . Pulmonary embolism ([place]) 12/28/2013   "2 clots in each lung"   Social History   Socioeconomic History  . Marital status: Single    Spouse name: Not on file  . Number of children: Not on file  . Years of  education: Not on file  . Highest education level: Not on file  Occupational History  . Not on file  Social Needs  . Financial resource strain: Not on file  . Food insecurity:    Worry: Not on file    Inability: Not on file  . Transportation needs:    Medical: Not on file    Non-medical: Not on file  Tobacco Use  . Smoking status: Former Smoker    Packs/day: 0.25     Years: 20.00    Pack years: 5.00    Types: Cigarettes    Last attempt to quit: 07/17/1999    Years since quitting: 19.1  . Smokeless tobacco: Never Used  Substance and Sexual Activity  . Alcohol use: Not Currently    Comment: "drank some in my 30's"  . Drug use: No  . Sexual activity: Never  Lifestyle  . Physical activity:    Days per week: Not on file    Minutes per session: Not on file  . Stress: Not on file  Relationships  . Social connections:    Talks on phone: Not on file    Gets together: Not on file    Attends religious service: Not on file    Active member of club or organization: Not on file    Attends meetings of clubs or organizations: Not on file    Relationship status: Not on file  . Intimate partner violence:    Fear of current or ex partner: Not on file    Emotionally abused: Not on file    Physically abused: Not on file    Forced sexual activity: Not on file  Other Topics Concern  . Not on file  Social History Narrative  . Not on file   Family History  Problem Relation Age of Onset  . Osteoarthritis Mother   . Asthma Mother   . Heart failure Mother   . Breast cancer Daughter     Physical Exam: Vitals:   09/09/18 1848  BP: (!) 155/139  Resp: (!) 25  Temp: 99.7 F (37.6 C)  TempSrc: Oral  SpO2: 96%   General: Obese black woman, in mild distress secondary to respiratory distress ENT: Grossly normal hearing, MMM. Cardiovascular: Tachycardic, S1 and S2 unremarkable with a regular rhythm.. No M/R/G. No LE edema.  Heart sounds distant. Respiratory: The patient is tachypneic with a respiratory rate between 24 and 28, speaking in high-pitched voices, with prolonged speaking becomes short of breath and has to stop.  Lung sounds are distant, she has faint expiratory wheezes upon forced expiration. Abdomen: Soft, non-tender.  Skin: No rash or induration seen on limited exam. Musculoskeletal: Grossly normal tone BUE/BLE. Appropriate ROM.  Psychiatric:  Grossly normal mood and affect. Neurologic: Moves all extremities in coordinated fashion, with exception of tremulousness of the left upper extremity which she reports frequently happens after receiving bronchodilators.  I have personally reviewed the following labs, culture data, and imaging studies.  Assessment/Plan:  #Acute asthma exacerbation, likely #Chronic hypoxic respiratory failure Course: Multiple admissions in 2020, most recently discharged on 09/07/2018. Presenting with persistent / worsening dyspnea and wheezing.  RR increased, tachycardic, faint wheezes on exam.  A/P: No CXR suggestive of PNA, on therapeutic AC with Xarelto. Clinical picture fits with asthma exacerbation.  Uncertain trigger.  Not improving with nebulized bronchodilators and prednisone 40 in the outpatient setting.  Will provide intensified steroid regimen with methylprednisolone 60 mg q 6 hours for now.  Scheduled duo-nebs q 4 hrs.  Supplemental O2 via [place].  If clinical course fails to improve, to consider TTE and/or CT of chest to evaluate other causes that may be co-contributors. RVP.  #Other problems: -Paroxysmal AF: s/p ablation procedure in the past, continue BB and CCBand Xarelto, and chronic flecainide  -Anxiety / depression: continue home venlafaxine with PRN Vistaril -Allergic rhinitis: continue home antihistamine and nasal spray -Steroid induced hyperglycemia: rapid acting insulin for correction factor, checking A1c -Hx of PE: in 2015, on AC as outlined above -Inadequate financial resources: consult to social work placed  DVT prophylaxis: On AC with DOAC Code Status:  Full code Disposition Plan: Anticipate D/C when medically ready Consults called: social work consult order placed Admission status: admit to hospital medicine service   Cheri Rous, MD [facility] YTWK:[phone]  If 7PM-7AM, please contact night-coverage [email or web address] Password [email or web address]  This document was created using  the aid of voice recognition / dication software.

## 2018-09-09 NOTE — Plan of Care (Signed)
  Problem: Education: Goal: Knowledge of General Education information will improve Description: Including pain rating scale, medication(s)/side effects and non-pharmacologic comfort measures Outcome: Progressing   Problem: Health Behavior/Discharge Planning: Goal: Ability to manage health-related needs will improve Outcome: Progressing   Problem: Clinical Measurements: Goal: Ability to maintain clinical measurements within normal limits will improve Outcome: Progressing Goal: Will remain free from infection Outcome: Progressing Goal: Diagnostic test results will improve Outcome: Progressing Goal: Respiratory complications will improve Outcome: Progressing Goal: Cardiovascular complication will be avoided Outcome: Progressing   Problem: Nutrition: Goal: Adequate nutrition will be maintained Outcome: Progressing   Problem: Coping: Goal: Level of anxiety will decrease Outcome: Progressing   Problem: Elimination: Goal: Will not experience complications related to urinary retention Outcome: Progressing   Problem: Pain Managment: Goal: General experience of comfort will improve Outcome: Progressing   Problem: Safety: Goal: Ability to remain free from injury will improve Outcome: Progressing   Problem: Skin Integrity: Goal: Risk for impaired skin integrity will decrease Outcome: Progressing   

## 2018-09-09 NOTE — ED Triage Notes (Signed)
Per EMS, Pt diagnosed with COPD in November. Pt was seen last Friday for exacerbation. Increasing SOB over the last two days. Received albuterol treatment with EMS. Pt has not had her nebulizer to treat COPD flare ups, has been using her inhaler to try and treat breathing problems.

## 2018-09-09 NOTE — ED Notes (Signed)
Bed: BB40 Expected date:  Expected time:  Means of arrival:  Comments: 63 yo SOB

## 2018-09-09 NOTE — ED Notes (Signed)
ED TO INPATIENT HANDOFF REPORT  ED Nurse Name and Phone #: Barth Kirks,   S Name/Age/Gender [name] White 63 y.o. female Room/Bed: WA13/WA13  Code Status   Code Status: Prior  Home/SNF/Other Given to floor Patient oriented to: self, place, time and situation Is this baseline? Yes   Triage Complete: Triage complete  Chief Complaint Shortness of breath  Triage Note Per EMS, Pt diagnosed with COPD in November. Pt was seen last Friday for exacerbation. Increasing SOB over the last two days. Received albuterol treatment with EMS. Pt has not had her nebulizer to treat COPD flare ups, has been using her inhaler to try and treat breathing problems.    Allergies Allergies  Allergen Reactions  . Caffeine Other (See Comments)    Migraine  . Vicodin [Hydrocodone-Acetaminophen] Nausea And Vomiting and Other (See Comments)    Headache   . Hydralazine Hcl     Other reaction(s): Other (See Comments) AKI leading to rhabdo and electrolyte abnormalities  . Hydrocodone-Acetaminophen Nausea And Vomiting  . Ciprofloxacin Hives and Rash  . Erythromycin Hives and Rash  . Lisinopril Cough    Other reaction(s): Cough  . Oxycodone Nausea And Vomiting    headache Other reaction(s): Vomiting  . Sulfamethoxazole-Trimethoprim Rash    Level of Care/Admitting Diagnosis ED Disposition    ED Disposition Condition Comment   Admit  Hospital Area: [facility] [[id number]]  Level of Care: Telemetry [5]  Admit to tele based on following criteria: Complex arrhythmia (Bradycardia/Tachycardia)  Diagnosis: Shortness of breath [786.05.ICD-9-CM]  Admitting Physician: Vilma Prader [[id number]]  Attending Physician: Vilma Prader [[id number]]  PT Class (Do Not Modify): Observation [104]  PT Acc Code (Do Not Modify): Observation [10022]       B Medical/Surgery History Past Medical History:  Diagnosis Date  . Anxiety   . Asthma   . Chronic lower back pain   . Depression   .  Family history of anesthesia complication    "daughter; causes her to pass out afterwards"  . GERD (gastroesophageal reflux disease)   . Hypertension   . Hyperthyroidism   . Migraine    "monthly" (12/28/2013)  . Osteoarthritis    "both knees; back of my neck; right pelvic bone" (12/28/2013)  . Paroxysmal A-fib ([place])   . Pulmonary embolism ([place]) 12/28/2013   "2 clots in each lung"   Past Surgical History:  Procedure Laterality Date  . ABDOMINAL HYSTERECTOMY    . APPENDECTOMY    . BREAST CYST EXCISION Right   . DILATION AND CURETTAGE OF UTERUS    . ELECTROPHYSIOLOGIC STUDY N/A 05/27/2015   Procedure: A-Flutter;  Surgeon: Evans Lance, MD;  Location: [facility];  Service: Cardiovascular;  Laterality: N/A;  . EXCISIONAL HEMORRHOIDECTOMY    . NASAL SINUS SURGERY  2007  . THYROIDECTOMY, PARTIAL Right 2005  . TUBAL LIGATION    . WISDOM TOOTH EXTRACTION       A IV Location/Drains/Wounds Patient Lines/Drains/Airways Status   Active Line/Drains/Airways    Name:   Placement date:   Placement time:   Site:   Days:   Peripheral IV 09/09/18 Left Hand   09/09/18    1907    Hand   less than 1          Intake/Output Last 24 hours  Intake/Output Summary (Last 24 hours) at 09/09/2018 2132 Last data filed at 09/09/2018 1957 Gross per 24 hour  Intake 50 ml  Output -  Net 50 ml  Labs/Imaging Results for orders placed or performed during the hospital encounter of 09/09/18 (from the past 48 hour(s))  CBC with Differential     Status: Abnormal   Collection Time: 09/09/18  6:49 PM  Result Value Ref Range   WBC 11.3 (H) 4.0 - 10.5 K/uL   RBC 4.09 3.87 - 5.11 MIL/uL   Hemoglobin 12.3 12.0 - 15.0 g/dL   HCT 40.2 36.0 - 46.0 %   MCV 98.3 80.0 - 100.0 fL   MCH 30.1 26.0 - 34.0 pg   MCHC 30.6 30.0 - 36.0 g/dL   RDW 16.2 (H) 11.5 - 15.5 %   Platelets 505 (H) 150 - 400 K/uL   nRBC 0.0 0.0 - 0.2 %   Neutrophils Relative % 80 %   Neutro Abs 9.1 (H) 1.7 - 7.7 K/uL   Lymphocytes  Relative 13 %   Lymphs Abs 1.5 0.7 - 4.0 K/uL   Monocytes Relative 3 %   Monocytes Absolute 0.4 0.1 - 1.0 K/uL   Eosinophils Relative 0 %   Eosinophils Absolute 0.0 0.0 - 0.5 K/uL   Basophils Relative 1 %   Basophils Absolute 0.1 0.0 - 0.1 K/uL   Immature Granulocytes 3 %   Abs Immature Granulocytes 0.30 (H) 0.00 - 0.07 K/uL    Comment: Performed at [facility], [place] [address], [place], [place] [address]  Basic metabolic panel     Status: Abnormal   Collection Time: 09/09/18  6:49 PM  Result Value Ref Range   Sodium 140 135 - 145 mmol/L   Potassium 3.8 3.5 - 5.1 mmol/L   Chloride 105 98 - 111 mmol/L   CO2 25 22 - 32 mmol/L   Glucose, Bld 225 (H) 70 - 99 mg/dL   BUN 16 8 - 23 mg/dL   Creatinine, Ser 0.74 0.44 - 1.00 mg/dL   Calcium 8.6 (L) 8.9 - 10.3 mg/dL   GFR calc non Af Amer >60 >60 mL/min   GFR calc Af Amer >60 >60 mL/min   Anion gap 10 5 - 15    Comment: Performed at [facility], [place] [address] [place], [place] [address]  Brain natriuretic peptide     Status: Abnormal   Collection Time: 09/09/18  6:50 PM  Result Value Ref Range   B Natriuretic Peptide 142.6 (H) 0.0 - 100.0 pg/mL    Comment: Performed at [facility], [place] [address] [place], [place] [address]  Influenza panel by PCR (type A & B)     Status: None   Collection Time: 09/09/18  6:58 PM  Result Value Ref Range   Influenza A By PCR NEGATIVE NEGATIVE   Influenza B By PCR NEGATIVE NEGATIVE    Comment: (NOTE) The Xpert Xpress Flu assay is intended as an aid in the diagnosis of  influenza and should not be used as a sole basis for treatment.  This  assay is FDA approved for nasopharyngeal swab specimens only. Nasal  washings and aspirates are unacceptable for Xpert Xpress Flu testing. Performed at [facility], [place] [address], [place], [place] [address]   I-stat troponin, ED     Status: None   Collection Time: 09/09/18  7:21 PM   Result Value Ref Range   Troponin i, poc 0.02 0.00 - 0.08 ng/mL   Comment 3            Comment: Due to the release kinetics of cTnI, a negative result within the first hours  of the onset of symptoms does not rule out myocardial infarction with certainty. If myocardial infarction is still suspected, repeat the test at appropriate intervals.    Dg Chest Port 1 View  Result Date: 09/09/2018 CLINICAL DATA:  Shortness of breath EXAM: PORTABLE CHEST 1 VIEW COMPARISON:  August 29, 2018 FINDINGS: There is atelectatic change in the left lower lobe. There is no edema or consolidation. Heart is upper normal in size with pulmonary vascularity normal. No adenopathy. There is aortic atherosclerosis. No bone lesions. IMPRESSION: Left lower lobe atelectatic change. No edema or consolidation. Stable cardiac silhouette. Aortic Atherosclerosis (ICD10-I70.0). Electronically Signed   By: Lowella Grip III M.D.   On: 09/09/2018 19:32    Pending Labs [employer] (From admission, onward)    Start     Ordered   Signed and Held  CBC  Tomorrow morning,   R     Signed and Held   Signed and Occupational hygienist morning,   R     Signed and Held   Signed and Held  Hemoglobin A1c  Tomorrow morning,   R    Comments:  To assess prior glycemic control    Signed and Held          Vitals/Pain Today's Vitals   09/09/18 1845 09/09/18 1848 09/09/18 2045 09/09/18 2100  BP:  (!) 155/139 100/62 (!) 114/59  Pulse:   (!) 112 (!) 105  Resp:  (!) 25 (!) 25 (!) 29  Temp:  99.7 F (37.6 C)    TempSrc:  Oral    SpO2:  96% 98% 98%  PainSc: 5        Isolation Precautions Droplet precaution  Medications Medications  ipratropium-albuterol (DUONEB) 0.5-2.5 (3) MG/3ML nebulizer solution 3 mL (3 mLs Nebulization Given 09/09/18 1909)  methylPREDNISolone sodium succinate (SOLU-MEDROL) 125 mg/2 mL injection 125 mg (125 mg Intravenous Given 09/09/18 1909)  benzonatate (TESSALON) capsule 100 mg (100 mg  Oral Given 09/09/18 1914)  magnesium sulfate IVPB 2 g 50 mL (0 g Intravenous Stopped 09/09/18 1957)    Mobility walks with person assist Low fall risk   Focused Assessments Cardiac Assessment Handoff:    Lab Results  Component Value Date   TROPONINI <0.03 07/01/2018   No results found for: DDIMER Does the Patient currently have chest pain? No  , Pulmonary Assessment Handoff:  Lung sounds: Bilateral Breath Sounds: Expiratory wheezes L Breath Sounds: Expiratory wheezes R Breath Sounds: Expiratory wheezes O2 Device: Nasal Cannula O2 Flow Rate (L/min): 2 L/min      R Recommendations: See Admitting Provider Note  Report given to:   Additional Notes:

## 2018-09-10 ENCOUNTER — Inpatient Hospital Stay (HOSPITAL_COMMUNITY): Payer: Medicare HMO

## 2018-09-10 ENCOUNTER — Other Ambulatory Visit: Payer: Self-pay | Admitting: *Deleted

## 2018-09-10 DIAGNOSIS — Z803 Family history of malignant neoplasm of breast: Secondary | ICD-10-CM | POA: Diagnosis not present

## 2018-09-10 DIAGNOSIS — Z7951 Long term (current) use of inhaled steroids: Secondary | ICD-10-CM | POA: Diagnosis not present

## 2018-09-10 DIAGNOSIS — Z881 Allergy status to other antibiotic agents status: Secondary | ICD-10-CM | POA: Diagnosis not present

## 2018-09-10 DIAGNOSIS — Z8249 Family history of ischemic heart disease and other diseases of the circulatory system: Secondary | ICD-10-CM | POA: Diagnosis not present

## 2018-09-10 DIAGNOSIS — I10 Essential (primary) hypertension: Secondary | ICD-10-CM | POA: Diagnosis present

## 2018-09-10 DIAGNOSIS — J441 Chronic obstructive pulmonary disease with (acute) exacerbation: Secondary | ICD-10-CM

## 2018-09-10 DIAGNOSIS — I480 Paroxysmal atrial fibrillation: Secondary | ICD-10-CM | POA: Diagnosis present

## 2018-09-10 DIAGNOSIS — Z825 Family history of asthma and other chronic lower respiratory diseases: Secondary | ICD-10-CM | POA: Diagnosis not present

## 2018-09-10 DIAGNOSIS — I340 Nonrheumatic mitral (valve) insufficiency: Secondary | ICD-10-CM | POA: Diagnosis not present

## 2018-09-10 DIAGNOSIS — Z87891 Personal history of nicotine dependence: Secondary | ICD-10-CM | POA: Diagnosis not present

## 2018-09-10 DIAGNOSIS — G4730 Sleep apnea, unspecified: Secondary | ICD-10-CM | POA: Diagnosis present

## 2018-09-10 DIAGNOSIS — Z7901 Long term (current) use of anticoagulants: Secondary | ICD-10-CM | POA: Diagnosis not present

## 2018-09-10 DIAGNOSIS — I361 Nonrheumatic tricuspid (valve) insufficiency: Secondary | ICD-10-CM | POA: Diagnosis not present

## 2018-09-10 DIAGNOSIS — M159 Polyosteoarthritis, unspecified: Secondary | ICD-10-CM | POA: Diagnosis present

## 2018-09-10 DIAGNOSIS — J9621 Acute and chronic respiratory failure with hypoxia: Secondary | ICD-10-CM | POA: Diagnosis present

## 2018-09-10 DIAGNOSIS — Z888 Allergy status to other drugs, medicaments and biological substances status: Secondary | ICD-10-CM | POA: Diagnosis not present

## 2018-09-10 DIAGNOSIS — K219 Gastro-esophageal reflux disease without esophagitis: Secondary | ICD-10-CM | POA: Diagnosis present

## 2018-09-10 DIAGNOSIS — J45909 Unspecified asthma, uncomplicated: Secondary | ICD-10-CM | POA: Diagnosis present

## 2018-09-10 DIAGNOSIS — Z885 Allergy status to narcotic agent status: Secondary | ICD-10-CM | POA: Diagnosis not present

## 2018-09-10 DIAGNOSIS — Z86711 Personal history of pulmonary embolism: Secondary | ICD-10-CM | POA: Diagnosis not present

## 2018-09-10 DIAGNOSIS — R0602 Shortness of breath: Secondary | ICD-10-CM | POA: Diagnosis not present

## 2018-09-10 DIAGNOSIS — M545 Low back pain: Secondary | ICD-10-CM | POA: Diagnosis present

## 2018-09-10 DIAGNOSIS — G43909 Migraine, unspecified, not intractable, without status migrainosus: Secondary | ICD-10-CM | POA: Diagnosis present

## 2018-09-10 DIAGNOSIS — T380X5A Adverse effect of glucocorticoids and synthetic analogues, initial encounter: Secondary | ICD-10-CM | POA: Diagnosis present

## 2018-09-10 DIAGNOSIS — I482 Chronic atrial fibrillation, unspecified: Secondary | ICD-10-CM | POA: Diagnosis present

## 2018-09-10 DIAGNOSIS — Z882 Allergy status to sulfonamides status: Secondary | ICD-10-CM | POA: Diagnosis not present

## 2018-09-10 DIAGNOSIS — I4891 Unspecified atrial fibrillation: Secondary | ICD-10-CM | POA: Diagnosis not present

## 2018-09-10 DIAGNOSIS — J383 Other diseases of vocal cords: Secondary | ICD-10-CM | POA: Diagnosis present

## 2018-09-10 DIAGNOSIS — G8929 Other chronic pain: Secondary | ICD-10-CM | POA: Diagnosis present

## 2018-09-10 LAB — RESPIRATORY PANEL BY PCR
Adenovirus: NOT DETECTED
Bordetella pertussis: NOT DETECTED
CORONAVIRUS HKU1-RVPPCR: NOT DETECTED
Chlamydophila pneumoniae: NOT DETECTED
Coronavirus 229E: NOT DETECTED
Coronavirus NL63: NOT DETECTED
Coronavirus OC43: NOT DETECTED
Influenza A: NOT DETECTED
Influenza B: NOT DETECTED
Metapneumovirus: NOT DETECTED
Mycoplasma pneumoniae: NOT DETECTED
Parainfluenza Virus 1: NOT DETECTED
Parainfluenza Virus 2: NOT DETECTED
Parainfluenza Virus 3: NOT DETECTED
Parainfluenza Virus 4: NOT DETECTED
Respiratory Syncytial Virus: NOT DETECTED
Rhinovirus / Enterovirus: NOT DETECTED

## 2018-09-10 LAB — BASIC METABOLIC PANEL
Anion gap: 11 (ref 5–15)
BUN: 19 mg/dL (ref 8–23)
CO2: 24 mmol/L (ref 22–32)
Calcium: 8.6 mg/dL — ABNORMAL LOW (ref 8.9–10.3)
Chloride: 102 mmol/L (ref 98–111)
Creatinine, Ser: 0.59 mg/dL (ref 0.44–1.00)
GFR calc non Af Amer: >60 mL/min (ref >60)
Glucose, Bld: 249 mg/dL — ABNORMAL HIGH (ref 70–99)
Potassium: 3.4 mmol/L — ABNORMAL LOW (ref 3.5–5.1)
Sodium: 137 mmol/L (ref 135–145)

## 2018-09-10 LAB — CBC
HCT: 39.2 % (ref 36.0–46.0)
Hemoglobin: 11.5 g/dL — ABNORMAL LOW (ref 12.0–15.0)
MCH: 29.3 pg (ref 26.0–34.0)
MCHC: 29.3 g/dL — ABNORMAL LOW (ref 30.0–36.0)
MCV: 99.7 fL (ref 80.0–100.0)
Platelets: 475 10*3/uL — ABNORMAL HIGH (ref 150–400)
RBC: 3.93 MIL/uL (ref 3.87–5.11)
RDW: 16.2 % — ABNORMAL HIGH (ref 11.5–15.5)
WBC: 11.5 10*3/uL — AB (ref 4.0–10.5)
nRBC: 0 % (ref 0.0–0.2)

## 2018-09-10 LAB — GLUCOSE, CAPILLARY
Glucose-Capillary: 193 mg/dL — ABNORMAL HIGH (ref 70–99)
Glucose-Capillary: 207 mg/dL — ABNORMAL HIGH (ref 70–99)
Glucose-Capillary: 158 mg/dL — ABNORMAL HIGH (ref 70–99)
Glucose-Capillary: 258 mg/dL — ABNORMAL HIGH (ref 70–99)

## 2018-09-10 LAB — ECHOCARDIOGRAM COMPLETE
Height: 65 in
Weight: 5040.6 oz

## 2018-09-10 LAB — HEMOGLOBIN A1C
Hgb A1c MFr Bld: 6.2 % — ABNORMAL HIGH (ref 4.8–5.6)
MEAN PLASMA GLUCOSE: 131.24 mg/dL

## 2018-09-10 MED ORDER — POTASSIUM CHLORIDE CRYS ER 20 MEQ PO TBCR
40.0000 meq | EXTENDED_RELEASE_TABLET | Freq: Once | ORAL | Status: AC
  Administered 2018-09-10: 40 meq via ORAL
  Filled 2018-09-10: qty 2

## 2018-09-10 MED ORDER — IOHEXOL 300 MG/ML  SOLN
75.0000 mL | Freq: Once | INTRAMUSCULAR | Status: AC | PRN
  Administered 2018-09-10: 75 mL via INTRAVENOUS

## 2018-09-10 MED ORDER — SODIUM CHLORIDE (PF) 0.9 % IJ SOLN
INTRAMUSCULAR | Status: AC
  Filled 2018-09-10: qty 50

## 2018-09-10 MED ORDER — ACETAMINOPHEN 325 MG PO TABS
650.0000 mg | ORAL_TABLET | Freq: Four times a day (QID) | ORAL | Status: DC | PRN
  Administered 2018-09-10 – 2018-09-12 (×3): 650 mg via ORAL
  Filled 2018-09-10 (×3): qty 2

## 2018-09-10 MED ORDER — ZOLPIDEM TARTRATE 5 MG PO TABS
5.0000 mg | ORAL_TABLET | Freq: Every evening | ORAL | Status: DC | PRN
  Administered 2018-09-11 – 2018-09-12 (×3): 5 mg via ORAL
  Filled 2018-09-10 (×3): qty 1

## 2018-09-10 MED ORDER — IPRATROPIUM-ALBUTEROL 0.5-2.5 (3) MG/3ML IN SOLN
3.0000 mL | Freq: Four times a day (QID) | RESPIRATORY_TRACT | Status: DC
  Administered 2018-09-10 (×3): 3 mL via RESPIRATORY_TRACT
  Filled 2018-09-10 (×3): qty 3

## 2018-09-10 MED ORDER — KETOTIFEN FUMARATE 0.025 % OP SOLN
1.0000 [drp] | Freq: Two times a day (BID) | OPHTHALMIC | Status: DC
  Administered 2018-09-10 – 2018-09-12 (×5): 1 [drp] via OPHTHALMIC
  Filled 2018-09-10: qty 5

## 2018-09-10 MED ORDER — BENZONATATE 100 MG PO CAPS
200.0000 mg | ORAL_CAPSULE | Freq: Three times a day (TID) | ORAL | Status: DC | PRN
  Administered 2018-09-11 – 2018-09-12 (×4): 200 mg via ORAL
  Filled 2018-09-10 (×4): qty 2

## 2018-09-10 MED ORDER — CYCLOSPORINE 0.05 % OP EMUL
1.0000 [drp] | Freq: Two times a day (BID) | OPHTHALMIC | Status: DC
  Administered 2018-09-10 – 2018-09-13 (×7): 1 [drp] via OPHTHALMIC
  Filled 2018-09-10 (×8): qty 30

## 2018-09-10 NOTE — Progress Notes (Addendum)
PROGRESS NOTE    [name] White  QIW:[id number] DOB: [birth year] DOA: 09/09/2018 PCP: [name], MD   Brief Narrative: 63 year old woman with medical problems including chronic hypoxic respiratory failure with a 2 L oxygen requirement, asthma leading to disability, proxysmal atrial fibrillation on anticoagulation with Xarelto, prior pulmonary embolus in 2015, chronic back pain, and anxiety presenting with wheezing and shortness of breath.  The patient has been admitted multiple times for respiratory distress since January 2020.  The patient was admitted recently from March 4 to September 07, 2018 where she was treated for an asthma exacerbation.  Since being discharged, the patient reports persistent wheezing and shortness of breath.  She reports she does not have adequate financial resources for food and shelter.  She was staying at a motel and ordered food, but became so dyspneic walking to the door she sought medical attention.  Associated symptoms include wheezing as well as clear productive cough for about 1 month.  Also reports lightheadedness as well as right-sided pleuritic chest pain.  She denies nausea, vomiting, diarrhea.  Other symptoms reported include obesity, chronic joint pain.  She reports she has not had her Symbicort, but has been using a nebulizer as needed.  She has been taking prednisone 40 mg daily since being discharged.  She reports she has been taking her Xarelto nightly with dinner.  No bleeding reported.  She reports she is never been intubated, became disabled in 2006 she reports on account of her asthma.  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  She reports prior to her hospitalizations she is living with her mother, believes that the best place for her to live long-term would be with her daughter who lives in [place], [place].  ED Course:  In the emergency department vital signs remarkable for increased respiratory rate up to 29, heart rate peak of  417, systolic blood pressure ranging from 100-155.  Temperature normal.  O2 saturation on 2 L nasal cannula within normal limits.  Chest x-ray revealed atelectatic change in the left lower lobe, no edema or consolidation.  CBC with white count of 11.3, neutrophil predominant, platelet count of 505.  BMP with glucose of 225.  BNP of 143.  Troponin within normal limits.  The patient was given multiple courses of nebulized bronchodilators, 125 mg of methylprednisolone, and IV magnesium.  Negative test for influenza.  Due to persistent respiratory distress, hospital medicine was consulted for further management. Assessment & Plan:   Active Problems:   Shortness of breath   Acute on chronic hypoxic respiratory failure secondary to asthma exacerbation patient reports multiple hospital admissions for the same this year.  Recently discharged from the hospital 09/07/2018.  I will continue IV steroids and scheduled nebulizer treatment at this time.  Patient failed outpatient treatment with p.o. steroids and bronchodilators.  When I saw her this morning she was on 5 L of oxygen.  She needs inpatient hospital stay for IV steroids.  Patient does have oxygen at home 24 hours.her oxygen requirement is higher than her baseline.  Paroxysmal atrial fibrillation status post ablation continue Xarelto beta-blocker and calcium channel blocker and flecainide.  Rate controlled.  History of pulmonary embolism continue Xarelto  Anxiety and depression continue home medications.  Steroid-induced hyperglycemia continue SSI follow-up hemoglobin A1c  Morbid obesity  Hypokalemia replete potassium check magnesium    Estimated body mass index is 52.42 kg/m as calculated from the following:   Height as of this encounter: 5\' 5"  (1.651 m).  Weight as of this encounter: 142.9 kg.  DVT prophylaxis: Xarelto  code Status: Full code Family Communication: No family available disposition Plan: Pending clinical improvement   Consultants:   None  Procedures: None Antimicrobials none  Subjective: Getting a breathing treatment feels better after the treatment but does complain of wheezing and increasing shortness of breath with any movement  Objective: Vitals:   09/10/18 0000 09/10/18 0500 09/10/18 0546 09/10/18 0816  BP:   137/86   Pulse:   67   Resp:   20   Temp:   98.6 F (37 C)   TempSrc:   Oral   SpO2: 98%  98% 96%  Weight:  (!) 142.9 kg    Height:        Intake/Output Summary (Last 24 hours) at 09/10/2018 1115 Last data filed at 09/09/2018 1957 Gross per 24 hour  Intake 50 ml  Output -  Net 50 ml   Filed Weights   09/09/18 2154 09/10/18 0500  Weight: (!) 141.7 kg (!) 142.9 kg    Examination:  General exam: Appears calm and comfortable  Respiratory system: Diffuse wheezing to auscultation. Respiratory effort normal. Cardiovascular system: S1 & S2 heard, RRR. No JVD, murmurs, rubs, gallops or clicks. No pedal edema. Gastrointestinal system: Abdomen is nondistended, soft and nontender. No organomegaly or masses felt. Normal bowel sounds heard. Central nervous system: Alert and oriented. No focal neurological deficits. Extremities: Symmetric 5 x 5 power. Skin: No rashes, lesions or ulcers Psychiatry: Judgement and insight appear normal. Mood & affect appropriate.     Data Reviewed: I have personally reviewed following labs and imaging studies  CBC: Recent Labs  Lab 09/09/18 1849 09/10/18 0424  WBC 11.3* 11.5*  NEUTROABS 9.1*  --   HGB 12.3 11.5*  HCT 40.2 39.2  MCV 98.3 99.7  PLT 505* 638*   Basic Metabolic Panel: Recent Labs  Lab 09/05/18 0438 09/09/18 1849 09/10/18 0424  NA  --  140 137  K  --  3.8 3.4*  CL  --  105 102  CO2  --  25 24  GLUCOSE  --  225* 249*  BUN  --  16 19  CREATININE 0.63 0.74 0.59  CALCIUM  --  8.6* 8.6*   GFR: Estimated Creatinine Clearance: 103.9 mL/min (by C-G formula based on SCr of 0.59 mg/dL). Liver Function Tests: No results for  input(s): AST, ALT, ALKPHOS, BILITOT, PROT, ALBUMIN in the last 168 hours. No results for input(s): LIPASE, AMYLASE in the last 168 hours. No results for input(s): AMMONIA in the last 168 hours. Coagulation Profile: No results for input(s): INR, PROTIME in the last 168 hours. Cardiac Enzymes: No results for input(s): CKTOTAL, CKMB, CKMBINDEX, TROPONINI in the last 168 hours. BNP (last 3 results) No results for input(s): PROBNP in the last 8760 hours. HbA1C: Recent Labs    09/10/18 0424  HGBA1C 6.2*   CBG: Recent Labs  Lab 09/03/18 2117 09/09/18 2215  GLUCAP 141* 160*   Lipid Profile: No results for input(s): CHOL, HDL, LDLCALC, TRIG, CHOLHDL, LDLDIRECT in the last 72 hours. Thyroid Function Tests: No results for input(s): TSH, T4TOTAL, FREET4, T3FREE, THYROIDAB in the last 72 hours. Anemia Panel: No results for input(s): VITAMINB12, FOLATE, FERRITIN, TIBC, IRON, RETICCTPCT in the last 72 hours. Sepsis Labs: No results for input(s): PROCALCITON, LATICACIDVEN in the last 168 hours.  Recent Results (from the past 240 hour(s))  Respiratory Panel by PCR     Status: None   Collection Time: 09/09/18 11:24  PM  Result Value Ref Range Status   Adenovirus NOT DETECTED NOT DETECTED Final   Coronavirus 229E NOT DETECTED NOT DETECTED Final    Comment: (NOTE) The Coronavirus on the Respiratory Panel, DOES NOT test for the novel  Coronavirus (2019 nCoV)    Coronavirus HKU1 NOT DETECTED NOT DETECTED Final   Coronavirus NL63 NOT DETECTED NOT DETECTED Final   Coronavirus OC43 NOT DETECTED NOT DETECTED Final   Metapneumovirus NOT DETECTED NOT DETECTED Final   Rhinovirus / Enterovirus NOT DETECTED NOT DETECTED Final   Influenza A NOT DETECTED NOT DETECTED Final   Influenza B NOT DETECTED NOT DETECTED Final   Parainfluenza Virus 1 NOT DETECTED NOT DETECTED Final   Parainfluenza Virus 2 NOT DETECTED NOT DETECTED Final   Parainfluenza Virus 3 NOT DETECTED NOT DETECTED Final   Parainfluenza  Virus 4 NOT DETECTED NOT DETECTED Final   Respiratory Syncytial Virus NOT DETECTED NOT DETECTED Final   Bordetella pertussis NOT DETECTED NOT DETECTED Final   Chlamydophila pneumoniae NOT DETECTED NOT DETECTED Final   Mycoplasma pneumoniae NOT DETECTED NOT DETECTED Final    Comment: Performed at [facility], [place] [address], [place], [place] [address]         Radiology Studies: Dg Chest Port 1 View  Result Date: 09/09/2018 CLINICAL DATA:  Shortness of breath EXAM: PORTABLE CHEST 1 VIEW COMPARISON:  August 29, 2018 FINDINGS: There is atelectatic change in the left lower lobe. There is no edema or consolidation. Heart is upper normal in size with pulmonary vascularity normal. No adenopathy. There is aortic atherosclerosis. No bone lesions. IMPRESSION: Left lower lobe atelectatic change. No edema or consolidation. Stable cardiac silhouette. Aortic Atherosclerosis (ICD10-I70.0). Electronically Signed   By: Lowella Grip III M.D.   On: 09/09/2018 19:32        Scheduled Meds: . cycloSPORINE  1 drop Both Eyes BID  . diclofenac sodium  2 g Topical QID  . diltiazem  30 mg Oral Q8H  . flecainide  100 mg Oral BID  . fluticasone  1 spray Each Nare Daily  . insulin aspart  0-15 Units Subcutaneous TID WC  . insulin aspart  0-5 Units Subcutaneous QHS  . ipratropium-albuterol  3 mL Nebulization Q6H  . ketotifen  1 drop Both Eyes BID  . loratadine  10 mg Oral Daily  . magnesium oxide  400 mg Oral BID  . mouth rinse  15 mL Mouth Rinse BID  . methylPREDNISolone (SOLU-MEDROL) injection  60 mg Intravenous Q6H  . metoprolol tartrate  25 mg Oral BID  . pantoprazole  40 mg Oral BID  . rivaroxaban  20 mg Oral Q supper  . sodium chloride flush  3 mL Intravenous Q12H  . venlafaxine XR  150 mg Oral Q breakfast   Continuous Infusions:   LOS: 0 days     Georgette Shell, MD [facility]  If 7PM-7AM, please contact night-coverage [email or web address] Password [facility] 09/10/2018, 11:15 AM

## 2018-09-10 NOTE — Progress Notes (Signed)
Conversation with patient regarding readmission revealed by patient that she has not been taking her nebulizer treatments upon discharge despite the ownership of two machines.  She states that she did not have the machines with her after discharge.    Patient has chronic wheezes at baseline and has minimal wheezes at this time.

## 2018-09-10 NOTE — Progress Notes (Signed)
  Echocardiogram 2D Echocardiogram has been performed.  Jennette Dubin 09/10/2018, 4:03 PM

## 2018-09-11 LAB — BASIC METABOLIC PANEL
Anion gap: 9 (ref 5–15)
BUN: 21 mg/dL (ref 8–23)
CHLORIDE: 103 mmol/L (ref 98–111)
CO2: 25 mmol/L (ref 22–32)
Calcium: 8.8 mg/dL — ABNORMAL LOW (ref 8.9–10.3)
Creatinine, Ser: 0.68 mg/dL (ref 0.44–1.00)
GFR calc Af Amer: >60 mL/min (ref >60)
GFR calc non Af Amer: >60 mL/min (ref >60)
Glucose, Bld: 141 mg/dL — ABNORMAL HIGH (ref 70–99)
Potassium: 4.2 mmol/L (ref 3.5–5.1)
Sodium: 137 mmol/L (ref 135–145)

## 2018-09-11 LAB — GLUCOSE, CAPILLARY
Glucose-Capillary: 222 mg/dL — ABNORMAL HIGH (ref 70–99)
Glucose-Capillary: 225 mg/dL — ABNORMAL HIGH (ref 70–99)
Glucose-Capillary: 161 mg/dL — ABNORMAL HIGH (ref 70–99)
Glucose-Capillary: 237 mg/dL — ABNORMAL HIGH (ref 70–99)

## 2018-09-11 LAB — MAGNESIUM: Magnesium: 2.6 mg/dL — ABNORMAL HIGH (ref 1.7–2.4)

## 2018-09-11 MED ORDER — ALBUTEROL SULFATE (2.5 MG/3ML) 0.083% IN NEBU
2.5000 mg | INHALATION_SOLUTION | Freq: Three times a day (TID) | RESPIRATORY_TRACT | Status: DC
  Administered 2018-09-11 – 2018-09-12 (×6): 2.5 mg via RESPIRATORY_TRACT
  Filled 2018-09-11 (×7): qty 3

## 2018-09-11 MED ORDER — ACETAMINOPHEN 325 MG PO TABS: 650.0000 mg | ORAL_TABLET | Freq: Four times a day (QID) | ORAL | Status: AC | PRN

## 2018-09-11 MED ORDER — ALBUTEROL SULFATE (2.5 MG/3ML) 0.083% IN NEBU
2.5000 mg | INHALATION_SOLUTION | RESPIRATORY_TRACT | Status: DC | PRN
  Administered 2018-09-11: 2.5 mg via RESPIRATORY_TRACT
  Filled 2018-09-11: qty 3

## 2018-09-11 MED ORDER — ORAL CARE MOUTH RINSE
15.0000 mL | Freq: Two times a day (BID) | OROMUCOSAL | Status: DC
  Administered 2018-09-11 – 2018-09-13 (×3): 15 mL via OROMUCOSAL

## 2018-09-11 MED ORDER — METHYLPREDNISOLONE SODIUM SUCC 125 MG IJ SOLR
60.0000 mg | Freq: Every day | INTRAMUSCULAR | Status: DC
  Administered 2018-09-12: 60 mg via INTRAVENOUS
  Filled 2018-09-11: qty 2

## 2018-09-11 MED ORDER — CHLORHEXIDINE GLUCONATE 0.12 % MT SOLN
15.0000 mL | Freq: Two times a day (BID) | OROMUCOSAL | Status: DC
  Administered 2018-09-11 – 2018-09-13 (×5): 15 mL via OROMUCOSAL
  Filled 2018-09-11 (×5): qty 15

## 2018-09-11 NOTE — Evaluation (Signed)
Physical Therapy Evaluation Patient Details Name: [name] White MRN: [id number] DOB: 16-Aug-1955 Today's Date: 09/11/2018   History of Present Illness  63 yo female admitted with dyspnea 2* asthma exacerbation. hx of COPD-O2 dep, depression, PE, A fib, morbid obesity, asthma, chronic pain/knee OA, anxiety    Clinical Impression  On eval, pt required Min guard-Min assist for mobility. She walked ~75 feet with use of a RW. Multiple rest breaks needed/taken during session. O2 sat 92% on 2L [place], dyspnea 3/4 during ambulation. Discussed d/c plan-pt doesn't feel like she can manage at home alone at this time. Will recommend ST rehab at SNF to improve functional mobility and regain independence. If SNF is not an option, recommend HHPT and home health aide assistance. Will continue to follow and progress activity as tolerated.     Follow Up Recommendations SNF    Equipment Recommendations  None recommended by PT    Recommendations for Other Services       Precautions / Restrictions        Mobility  Bed Mobility Overal bed mobility: Needs Assistance Bed Mobility: Supine to Sit     Supine to sit: Supervision     General bed mobility comments: for safety. Increased time  Transfers Overall transfer level: Needs assistance Equipment used: Rolling walker (2 wheeled) Transfers: Sit to/from Stand Sit to Stand: Min guard;Min assist         General transfer comment: close guard for safety. VCs hand placement. Min assist from low toilet surface.  Ambulation/Gait Ambulation/Gait assistance: Min guard Gait Distance (Feet): 75 Feet Assistive device: Rolling walker (2 wheeled) Gait Pattern/deviations: Step-through pattern;Trunk flexed     General Gait Details: slow gait speed. 4 standing rest breaks taken/needed. Dyspena 3/4. O2 sat 92% on 2L [place].   Stairs            Wheelchair Mobility    Modified Rankin (Stroke Patients Only)       Balance Overall balance assessment:  Mild deficits observed, not formally tested                                           Pertinent Vitals/Pain Pain Assessment: Faces Faces Pain Scale: Hurts little more Pain Location: bil knees Pain Descriptors / Indicators: Sore;Aching Pain Intervention(s): Monitored during session    Home Living Family/patient expects to be discharged to:: Unsure     Type of Home: House       Home Layout: Two level;Bed/bath upstairs Home Equipment: Cane - single point      Prior Function Level of Independence: Independent with assistive device(s)         Comments: using cane for ambulation     Hand Dominance        Extremity/Trunk Assessment   Upper Extremity Assessment Upper Extremity Assessment: Generalized weakness    Lower Extremity Assessment Lower Extremity Assessment: Generalized weakness    Cervical / Trunk Assessment Cervical / Trunk Assessment: Normal  Communication   Communication: No difficulties  Cognition Arousal/Alertness: Awake/alert Behavior During Therapy: WFL for tasks assessed/performed Overall Cognitive Status: Within Functional Limits for tasks assessed                                        General Comments      Exercises  Assessment/Plan    PT Assessment Patient needs continued PT services  PT Problem List Decreased strength;Decreased mobility;Decreased activity tolerance;Decreased balance;Decreased knowledge of use of DME;Cardiopulmonary status limiting activity       PT Treatment Interventions DME instruction;Functional mobility training;Patient/family education;Balance training;Gait training;Therapeutic activities;Therapeutic exercise    PT Goals (Current goals can be found in the Care Plan section)  Acute Rehab PT Goals Patient Stated Goal: rehab to regain independence PT Goal Formulation: With patient Time For Goal Achievement: 09/25/18 Potential to Achieve Goals: Fair    Frequency Min  2X/week   Barriers to discharge        Co-evaluation               AM-PAC PT "6 Clicks" Mobility  Outcome Measure Help needed turning from your back to your side while in a flat bed without using bedrails?: A Little Help needed moving from lying on your back to sitting on the side of a flat bed without using bedrails?: A Little Help needed moving to and from a bed to a chair (including a wheelchair)?: A Little Help needed standing up from a chair using your arms (e.g., wheelchair or bedside chair)?: A Little Help needed to walk in hospital room?: A Little Help needed climbing 3-5 steps with a railing? : A Little 6 Click Score: 18    End of Session Equipment Utilized During Treatment: Oxygen;Gait belt Activity Tolerance: Patient limited by fatigue Patient left: in bed;with call bell/phone within reach;with bed alarm set(sitting EOB)   PT Visit Diagnosis: Difficulty in walking, not elsewhere classified (R26.2)    Time: 1020-1049 PT Time Calculation (min) (ACUTE ONLY): 29 min   Charges:   PT Evaluation $PT Eval Moderate Complexity: 1 Mod PT Treatments $Gait Training: 8-22 mins          Weston Anna, [place] Pager: [phone] Office: [phone]

## 2018-09-11 NOTE — Progress Notes (Signed)
Pt states she has no place to discharge to and that she called her insurance company to go back to [facility].

## 2018-09-11 NOTE — Evaluation (Signed)
Clinical/Bedside Swallow Evaluation Patient Details  Name: [name] White MRN: [id number] Date of Birth: [birth year]  Today's Date: 09/11/2018 Time: SLP Start Time (ACUTE ONLY): 1205 SLP Stop Time (ACUTE ONLY): 1228 SLP Time Calculation (min) (ACUTE ONLY): 23 min  Past Medical History:  Past Medical History:  Diagnosis Date  . Anxiety   . Asthma   . Chronic lower back pain   . Depression   . Family history of anesthesia complication    "daughter; causes her to pass out afterwards"  . GERD (gastroesophageal reflux disease)   . Hypertension   . Hyperthyroidism   . Migraine    "monthly" (12/28/2013)  . Osteoarthritis    "both knees; back of my neck; right pelvic bone" (12/28/2013)  . Paroxysmal A-fib ([place])   . Pulmonary embolism ([place]) 12/28/2013   "2 clots in each lung"   Past Surgical History:  Past Surgical History:  Procedure Laterality Date  . ABDOMINAL HYSTERECTOMY    . APPENDECTOMY    . BREAST CYST EXCISION Right   . DILATION AND CURETTAGE OF UTERUS    . ELECTROPHYSIOLOGIC STUDY N/A 05/27/2015   Procedure: A-Flutter;  Surgeon: Evans Lance, MD;  Location: [facility];  Service: Cardiovascular;  Laterality: N/A;  . EXCISIONAL HEMORRHOIDECTOMY    . NASAL SINUS SURGERY  2007  . THYROIDECTOMY, PARTIAL Right 2005  . TUBAL LIGATION    . WISDOM TOOTH EXTRACTION     HPI:  63 yo female recently dc'd from hospital with COPD exacerbation now readmitted.  PMH + for diagnosis of vocal cord dysfunction, COPD, GERD, smoke exposure, .  SLP referral received for VCD.     Assessment / Plan / Recommendation Clinical Impression  Patient presents wtih no clinical indication of aspiration with 3 ounce Yale water test.  She did not demonstrate dysphagia symptoms during intake either.  No focal CN deficits nor increased work of breathing with self feeding.  Pt did become dyspneic with movement up in bed.  Recommend pt continue regular/thin diet -Provided pt with information re:  swallowing/respiratory coordination and advised not to eat if dyspneic.  Of note, pt has h/o GERD but denies symptoms currently.  Will follow up x1 to assure tolerance.   SLP Visit Diagnosis: Dysphagia, unspecified (R13.10)    Aspiration Risk  Mild aspiration risk    Diet Recommendation Regular;Thin liquid   Liquid Administration via: Cup;Straw Medication Administration: Whole meds with liquid Supervision: Patient able to self feed Compensations: Minimize environmental distractions;Slow rate;Small sips/bites Postural Changes: Seated upright at 90 degrees;Remain upright for at least 30 minutes after po intake    Other  Recommendations Oral Care Recommendations: Oral care BID   Follow up Recommendations        Frequency and Duration min 1 x/week  1 week       Prognosis Prognosis for Safe Diet Advancement: Good      Swallow Study   General Date of Onset: 09/11/18 HPI: 63 yo female recently dc'd from hospital with COPD exacerbation now readmitted.  PMH + for diagnosis of vocal cord dysfunction, COPD, GERD, smoke exposure, .  SLP referral received for VCD.   Type of Study: Bedside Swallow Evaluation Diet Prior to this Study: Regular;Thin liquids Temperature Spikes Noted: No Respiratory Status: Nasal cannula History of Recent Intubation: No Behavior/Cognition: Alert;Cooperative;Pleasant mood Oral Cavity Assessment: Within Functional Limits Oral Care Completed by SLP: No Oral Cavity - Dentition: Adequate natural dentition Vision: Functional for self-feeding Self-Feeding Abilities: Able to feed self Patient Positioning:  Upright in bed Baseline Vocal Quality: Hoarse Volitional Cough: Congested Volitional Swallow: Able to elicit    Oral/Motor/Sensory Function Overall Oral Motor/Sensory Function: Within functional limits   Ice Chips Ice chips: Not tested   Thin Liquid Thin Liquid: Within functional limits Presentation: Cup;Straw    Nectar Thick Nectar Thick Liquid: Not tested    Honey Thick Honey Thick Liquid: Not tested   Puree Puree: Within functional limits Presentation: Self Fed;Spoon   Solid     Solid: Within functional limits Presentation: Self Fed;Spoon      Macario Golds 09/11/2018,1:03 PM    Luanna Salk, [place] [facility] SLP [place] Pager [phone] Office [phone]

## 2018-09-11 NOTE — [place] FL2 (Addendum)
[place] MEDICAID FL2 LEVEL OF CARE SCREENING TOOL     IDENTIFICATION  Patient Name: [name] White Birthdate: [birth year] Sex: female Admission Date (Current Location): 09/09/2018  [facility] and [place] Number:  Herbalist and Address:  [facility],  [place], [place]      Provider Number: [id number]  Attending Physician Name and Address:  Georgette Shell, MD  Relative Name and Phone Number:       Current Level of Care: Hospital Recommended Level of Care: [facility] Prior Approval Number:    Date Approved/Denied:   PASRR Number:  Pending  Discharge Plan: SNF    Current Diagnoses: Patient Active Problem List   Diagnosis Date Noted  . Reactive airway disease 09/10/2018  . Shortness of breath 09/09/2018  . COPD exacerbation ([place]) 08/29/2018  . Acute bronchitis 08/29/2018  . Acute on chronic respiratory failure with hypoxia ([place]) 08/29/2018  . Sleep apnea 07/05/2018  . Chronic respiratory failure with hypoxia ([place]) 07/05/2018  . Osteoarthritis 03/02/2018  . Viral illness 02/13/2018  . Nausea 02/13/2018  . Left medial tibial stress syndrome 12/26/2017  . Hematoma 11/27/2017  . Severe episode of recurrent major depressive disorder, without psychotic features ([place])   . MDD (major depressive disorder), recurrent episode, severe ([place]) 09/07/2015  . Atrial flutter ([place]) 07/29/2015  . History of atrial flutter 06/26/2015  . Chest pain 03/22/2015  . Asthma 11/12/2014  . History of hypertension 11/05/2014  . Atrial fibrillation, chronic 11/03/2014  . Chronic anticoagulation 11/03/2014  . History of pulmonary embolus (PE) 11/03/2014  . Non-traumatic rhabdomyolysis 11/03/2014  . Major depressive disorder 11/03/2014  . SOB (shortness of breath)   . MDD (major depressive disorder), recurrent severe, without psychosis ([place]) 03/05/2014  . Pulmonary embolism ([place]) 12/27/2013  . Vocal cord dysfunction 11/12/2013  .  Mixed headache 01/06/2012  . OVERACTIVE BLADDER 04/18/2008  . Osteoarthritis of both knees 04/18/2008  . POLYARTHRITIS 09/14/2007  . Morbid obesity ([place]) 08/24/2006  . RESTLESS LEGS SYNDROME 08/24/2006  . HYPERTENSION, BENIGN SYSTEMIC 08/24/2006  . RHINITIS, ALLERGIC 08/24/2006  . GASTROESOPHAGEAL REFLUX, NO ESOPHAGITIS 08/24/2006    Orientation RESPIRATION BLADDER Height & Weight     Self, Time, Situation, Place  O2(2L) Continent Weight: (!) 319 lb 3.6 oz (144.8 kg) Height:  5\' 5"  (165.1 cm)  BEHAVIORAL SYMPTOMS/MOOD NEUROLOGICAL BOWEL NUTRITION STATUS      Continent Diet(Heart Healthy )  AMBULATORY STATUS COMMUNICATION OF NEEDS Skin   Limited Assist Verbally Normal                       Personal Care Assistance Level of Assistance  Bathing, Feeding, Dressing Bathing Assistance: Limited assistance Feeding assistance: Independent Dressing Assistance: Limited assistance     Functional Limitations Info  Sight, Hearing, Speech Sight Info: Impaired Hearing Info: Adequate Speech Info: Adequate    SPECIAL CARE FACTORS FREQUENCY  PT (By licensed PT), OT (By licensed OT)  Speech Therapy     PT Frequency: 5x/week OT Frequency: 5x/week  Speech Therapy Frequency:1x          Contractures Contractures Info: Not present    Additional Factors Info  Code Status, Allergies, Psychotropic Code Status Info: Fullcode Allergies Info: Allergies: Caffeine, Vicodin Hydrocodone-acetaminophen, Hydralazine Hcl, Hydrocodone-acetaminophen, Ciprofloxacin, Erythromycin, Lisinopril, Oxycodone, Sulfamethoxazole-trimethoprim Psychotropic Info: Effexor         Current Medications (09/11/2018):  This is the current hospital active medication list Current Facility-Administered Medications  Medication Dose Route Frequency Provider  Last Rate Last Dose  . acetaminophen (TYLENOL) tablet 650 mg  650 mg Oral Q6H PRN Gardiner Barefoot, NP   650 mg at 09/11/18 0902  . albuterol (PROVENTIL)  (2.5 MG/3ML) 0.083% nebulizer solution 2.5 mg  2.5 mg Nebulization Q2H PRN Georgette Shell, MD   2.5 mg at 09/11/18 1110  . albuterol (PROVENTIL) (2.5 MG/3ML) 0.083% nebulizer solution 2.5 mg  2.5 mg Nebulization TID Georgette Shell, MD   2.5 mg at 09/11/18 1501  . benzonatate (TESSALON) capsule 200 mg  200 mg Oral TID PRN Gardiner Barefoot, NP   200 mg at 09/11/18 1103  . chlorhexidine (PERIDEX) 0.12 % solution 15 mL  15 mL Mouth Rinse BID Georgette Shell, MD   15 mL at 09/11/18 1315  . cycloSPORINE (RESTASIS) 0.05 % ophthalmic emulsion 1 drop  1 drop Both Eyes BID Georgette Shell, MD   1 drop at 09/11/18 1103  . diclofenac sodium (VOLTAREN) 1 % transdermal gel 2 g  2 g Topical QID Vilma Prader, MD   2 g at 09/11/18 1104  . diltiazem (CARDIZEM) tablet 30 mg  30 mg Oral Q8H Allie Bossier, MD   30 mg at 09/11/18 1314  . flecainide (TAMBOCOR) tablet 100 mg  100 mg Oral BID Vilma Prader, MD   100 mg at 09/11/18 1104  . fluticasone (FLONASE) 50 MCG/ACT nasal spray 1 spray  1 spray Each Nare Daily Vilma Prader, MD   1 spray at 09/11/18 1103  . hydrOXYzine (ATARAX/VISTARIL) tablet 25 mg  25 mg Oral BID PRN Vilma Prader, MD   25 mg at 09/11/18 1104  . insulin aspart (novoLOG) injection 0-15 Units  0-15 Units Subcutaneous TID WC Vilma Prader, MD   5 Units at 09/11/18 1232  . insulin aspart (novoLOG) injection 0-5 Units  0-5 Units Subcutaneous QHS Vilma Prader, MD   3 Units at 09/10/18 2128  . ketotifen (ZADITOR) 0.025 % ophthalmic solution 1 drop  1 drop Both Eyes BID Georgette Shell, MD   1 drop at 09/11/18 1104  . loratadine (CLARITIN) tablet 10 mg  10 mg Oral Daily Vilma Prader, MD   10 mg at 09/11/18 1104  . magnesium oxide (MAG-OX) tablet 400 mg  400 mg Oral BID Vilma Prader, MD   400 mg at 09/11/18 1103  . MEDLINE mouth rinse  15 mL Mouth Rinse q12n4p Georgette Shell, MD   15 mL at 09/11/18 1315  . [START ON 09/12/2018]  methylPREDNISolone sodium succinate (SOLU-MEDROL) 125 mg/2 mL injection 60 mg  60 mg Intravenous Daily Georgette Shell, MD      . metoprolol tartrate (LOPRESSOR) tablet 25 mg  25 mg Oral BID Vilma Prader, MD   25 mg at 09/11/18 1103  . pantoprazole (PROTONIX) EC tablet 40 mg  40 mg Oral BID Vilma Prader, MD   40 mg at 09/11/18 1104  . rivaroxaban (XARELTO) tablet 20 mg  20 mg Oral Q supper Vilma Prader, MD   20 mg at 09/10/18 1735  . sodium chloride flush (NS) 0.9 % injection 3 mL  3 mL Intravenous Q12H Vilma Prader, MD   3 mL at 09/11/18 1105  . venlafaxine XR (EFFEXOR-XR) 24 hr capsule 150 mg  150 mg Oral Q breakfast Vilma Prader, MD   150 mg at 09/11/18 0804  . zolpidem (AMBIEN) tablet 5 mg  5 mg Oral QHS PRN Clance Boll  J, NP   5 mg at 09/11/18 0032     Discharge Medications: Please see discharge summary for a list of discharge medications.  Relevant Imaging Results:  Relevant Lab Results:   Additional Information SS# [id number]  Lia Hopping, LCSW

## 2018-09-11 NOTE — Progress Notes (Addendum)
PROGRESS NOTE    [name] White  BZJ:[id number] DOB: [birth year] DOA: 09/09/2018 PCP: [name], MD   Brief Narrative: 63 year old woman with medical problems including chronic hypoxic respiratory failure with a 2 L oxygen requirement, asthma leading to disability, proxysmal atrial fibrillation on anticoagulation with Xarelto, prior pulmonary embolus in 2015, chronic back pain, and anxiety presenting with wheezing and shortness of breath.  The patient has been admitted multiple timesfor respiratory distress since January 2020. The patient was admitted recently from March 4 to September 07, 2018 where she was treated for an asthma exacerbation. Since being discharged, the patient reports persistent wheezing and shortness of breath. She reports she does not have adequate financial resources for food and shelter. She was staying at a motel and ordered food, but became so dyspneic walking to the door she sought medical attention. Associated symptoms include wheezing as well as clear productive cough for about 1 month. Also reports lightheadedness as well as right-sided pleuritic chest pain. She denies nausea, vomiting, diarrhea. Other symptoms reported include obesity, chronic joint pain.  She reports she has not had her Symbicort, but has been using a nebulizer as needed. She has been taking prednisone 40 mg daily since being discharged. She reports she has been taking her Xarelto nightly with dinner. No bleeding reported.  She reports she is never been intubated, became disabled in 2006 she reports on account of her asthma. 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 She reports prior to her hospitalizations she is living with her mother, believes that the best place for her to live long-term would be with her daughter who lives in [place]  ED Course: In the emergency department vital signs remarkable for increased respiratory rate up to 29, heart rate peak of  017, systolic blood pressure ranging from 100-155. Temperature normal. O2 saturation on 2 L nasal cannula within normal limits. Chest x-ray revealed atelectatic change in the left lower lobe, no edema or consolidation. CBC with white count of 11.3, neutrophil predominant, platelet count of 505. BMP with glucose of 225. BNP of 143.Troponin within normal limits. The patient was given multiple courses of nebulized bronchodilators, 125 mg of methylprednisolone, and IV magnesium. Negative test for influenza. Due to persistent respiratory distress, hospital medicine was consulted for further management.   Assessment & Plan:   Active Problems:   COPD exacerbation (HCC)   Shortness of breath   Reactive airway disease    #1 vocal cord dysfunction -this patient does not have COPD or asthma as a diagnosis.  Dr. Agustina Caroli note PFTs not consistent with any of the above.  However she does have vocal cord dysfunction.  She was recommended to stop Symbicort and continue Flonase.  She is now on 2 L of oxygen.  She is at home on 2 L of oxygen 24/7.  Her body is very deconditioned and she needs ongoing PT  I will also order speech therapy as an outpatient to help with her vocal cord dysfunction.  In addition to this she probably has sleep apnea with morbid obesity.  She was told to follow-up with the sleep specialist to obtain a sleep study.  Taper steroids to Solu-Medrol for 60 mg once a day.  CT of the chest showed no acute changes echocardiogram normal ejection fraction.  #2 paroxysmal atrial fibrillation status post ablation continue Xarelto beta-blocker and calcium channel blocker and flecainide. Rate controlled.  History of pulmonary embolism continue Xarelto  Anxiety and depression continue home medications.  Estimated body mass index is 53.12 kg/m as calculated from the following:   Height as of this encounter: 5\' 5"  (1.651 m).   Weight as of this encounter: 144.8 kg.  DVT prophylaxis:  SCD  code Status: Full code  family Communication none  disposition Plan: Physical therapy recommends SNF social worker case manager consulted Consultants:   Spoke to PCCM over the phone regarding her vocal cord dysfunction recommended speech evaluation and speech therapy as outpatient in addition to continuing Flonase  Procedures: None Antimicrobials: None Subjective: Reports she feels short of breath with any movement she makes denies chest pain CT of the chest done yesterday shows no acute changes echo shows normal ejection fraction  Objective: Vitals:   09/10/18 2103 09/11/18 0513 09/11/18 1317 09/11/18 1501  BP: 139/76 (!) 153/91 (!) 144/88   Pulse: 90 71 75   Resp: 18 18    Temp: 98.4 F (36.9 C) 98.4 F (36.9 C)    TempSrc: Oral Oral    SpO2: 99% 97% 98% 98%  Weight:  (!) 144.8 kg    Height:        Intake/Output Summary (Last 24 hours) at 09/11/2018 1514 Last data filed at 09/11/2018 0900 Gross per 24 hour  Intake 240 ml  Output 300 ml  Net -60 ml   Filed Weights   09/09/18 2154 09/10/18 0500 09/11/18 0513  Weight: (!) 141.7 kg (!) 142.9 kg (!) 144.8 kg    Examination:  General exam: Appears calm and comfortable  Respiratory system: Few scattered wheezes to auscultation. Respiratory effort normal. Cardiovascular system: S1 & S2 heard, RRR. No JVD, murmurs, rubs, gallops or clicks. No pedal edema. Gastrointestinal system: Abdomen is nondistended, soft and nontender. No organomegaly or masses felt. Normal bowel sounds heard. Central nervous system: Alert and oriented. No focal neurological deficits. Extremities: Symmetric 5 x 5 power. Skin: No rashes, lesions or ulcers Psychiatry: Judgement and insight appear normal. Mood & affect appropriate.     Data Reviewed: I have personally reviewed following labs and imaging studies  CBC: Recent Labs  Lab 09/09/18 1849 09/10/18 0424  WBC 11.3* 11.5*  NEUTROABS 9.1*  --   HGB 12.3 11.5*  HCT 40.2 39.2  MCV 98.3  99.7  PLT 505* 536*   Basic Metabolic Panel: Recent Labs  Lab 09/05/18 0438 09/09/18 1849 09/10/18 0424 09/11/18 0345  NA  --  140 137 137  K  --  3.8 3.4* 4.2  CL  --  105 102 103  CO2  --  25 24 25   GLUCOSE  --  225* 249* 141*  BUN  --  16 19 21   CREATININE 0.63 0.74 0.59 0.68  CALCIUM  --  8.6* 8.6* 8.8*  MG  --   --   --  2.6*   GFR: Estimated Creatinine Clearance: 104.7 mL/min (by C-G formula based on SCr of 0.68 mg/dL). Liver Function Tests: No results for input(s): AST, ALT, ALKPHOS, BILITOT, PROT, ALBUMIN in the last 168 hours. No results for input(s): LIPASE, AMYLASE in the last 168 hours. No results for input(s): AMMONIA in the last 168 hours. Coagulation Profile: No results for input(s): INR, PROTIME in the last 168 hours. Cardiac Enzymes: No results for input(s): CKTOTAL, CKMB, CKMBINDEX, TROPONINI in the last 168 hours. BNP (last 3 results) No results for input(s): PROBNP in the last 8760 hours. HbA1C: Recent Labs    09/10/18 0424  HGBA1C 6.2*   CBG: Recent Labs  Lab 09/10/18 1136 09/10/18 1709 09/10/18 2107  09/11/18 0744 09/11/18 1213  GLUCAP 207* 158* 258* 222* 225*   Lipid Profile: No results for input(s): CHOL, HDL, LDLCALC, TRIG, CHOLHDL, LDLDIRECT in the last 72 hours. Thyroid Function Tests: No results for input(s): TSH, T4TOTAL, FREET4, T3FREE, THYROIDAB in the last 72 hours. Anemia Panel: No results for input(s): VITAMINB12, FOLATE, FERRITIN, TIBC, IRON, RETICCTPCT in the last 72 hours. Sepsis Labs: No results for input(s): PROCALCITON, LATICACIDVEN in the last 168 hours.  Recent Results (from the past 240 hour(s))  Respiratory Panel by PCR     Status: None   Collection Time: 09/09/18 11:24 PM  Result Value Ref Range Status   Adenovirus NOT DETECTED NOT DETECTED Final   Coronavirus 229E NOT DETECTED NOT DETECTED Final    Comment: (NOTE) The Coronavirus on the Respiratory Panel, DOES NOT test for the novel  Coronavirus (2019 nCoV)     Coronavirus HKU1 NOT DETECTED NOT DETECTED Final   Coronavirus NL63 NOT DETECTED NOT DETECTED Final   Coronavirus OC43 NOT DETECTED NOT DETECTED Final   Metapneumovirus NOT DETECTED NOT DETECTED Final   Rhinovirus / Enterovirus NOT DETECTED NOT DETECTED Final   Influenza A NOT DETECTED NOT DETECTED Final   Influenza B NOT DETECTED NOT DETECTED Final   Parainfluenza Virus 1 NOT DETECTED NOT DETECTED Final   Parainfluenza Virus 2 NOT DETECTED NOT DETECTED Final   Parainfluenza Virus 3 NOT DETECTED NOT DETECTED Final   Parainfluenza Virus 4 NOT DETECTED NOT DETECTED Final   Respiratory Syncytial Virus NOT DETECTED NOT DETECTED Final   Bordetella pertussis NOT DETECTED NOT DETECTED Final   Chlamydophila pneumoniae NOT DETECTED NOT DETECTED Final   Mycoplasma pneumoniae NOT DETECTED NOT DETECTED Final    Comment: Performed at [facility], [address] [place], [place] [address]         Radiology Studies: Ct Chest W Contrast  Result Date: 09/10/2018 CLINICAL DATA:  Shortness of breath. EXAM: CT CHEST WITH CONTRAST TECHNIQUE: Multidetector CT imaging of the chest was performed during intravenous contrast administration. CONTRAST:  2mL OMNIPAQUE IOHEXOL 300 MG/ML  SOLN COMPARISON:  Chest x-ray from yesterday. CT chest dated March 22, 2015. FINDINGS: Cardiovascular: Normal heart size. No pericardial effusion. No thoracic aortic aneurysm. No central pulmonary embolism. Mediastinum/Nodes: No enlarged mediastinal, hilar, or axillary lymph nodes. Prior right hemithyroidectomy. The trachea and esophagus demonstrate no significant findings. Lungs/Pleura: No focal consolidation, pleural effusion, or pneumothorax. Postinflammatory bronchiectasis and scarring in the medial right lower lobe. Minimal bilateral lower lobe subsegmental atelectasis. No suspicious pulmonary nodule. Upper Abdomen: No acute abnormality. Slight interval increase in size of the now 1.4 cm simple cyst in the left  hepatic lobe. Musculoskeletal: Acute nondisplaced fracture of the right anterolateral sixth rib. Multilevel advanced thoracic spondylosis. Chronic elevation of the right hemidiaphragm. IMPRESSION: 1.  No acute intrathoracic process. 2. Acute nondisplaced fracture of the right anterolateral sixth rib. Electronically Signed   By: Titus Dubin M.D.   On: 09/10/2018 16:39   Dg Chest Port 1 View  Result Date: 09/09/2018 CLINICAL DATA:  Shortness of breath EXAM: PORTABLE CHEST 1 VIEW COMPARISON:  August 29, 2018 FINDINGS: There is atelectatic change in the left lower lobe. There is no edema or consolidation. Heart is upper normal in size with pulmonary vascularity normal. No adenopathy. There is aortic atherosclerosis. No bone lesions. IMPRESSION: Left lower lobe atelectatic change. No edema or consolidation. Stable cardiac silhouette. Aortic Atherosclerosis (ICD10-I70.0). Electronically Signed   By: Lowella Grip III M.D.   On: 09/09/2018 19:32  Scheduled Meds: . albuterol  2.5 mg Nebulization TID  . chlorhexidine  15 mL Mouth Rinse BID  . cycloSPORINE  1 drop Both Eyes BID  . diclofenac sodium  2 g Topical QID  . diltiazem  30 mg Oral Q8H  . flecainide  100 mg Oral BID  . fluticasone  1 spray Each Nare Daily  . insulin aspart  0-15 Units Subcutaneous TID WC  . insulin aspart  0-5 Units Subcutaneous QHS  . ketotifen  1 drop Both Eyes BID  . loratadine  10 mg Oral Daily  . magnesium oxide  400 mg Oral BID  . mouth rinse  15 mL Mouth Rinse q12n4p  . methylPREDNISolone (SOLU-MEDROL) injection  60 mg Intravenous Q6H  . metoprolol tartrate  25 mg Oral BID  . pantoprazole  40 mg Oral BID  . rivaroxaban  20 mg Oral Q supper  . sodium chloride flush  3 mL Intravenous Q12H  . venlafaxine XR  150 mg Oral Q breakfast   Continuous Infusions:   LOS: 1 day     Georgette Shell, MD [facility]  If 7PM-7AM, please contact night-coverage [email or web address] Password [facility]  09/11/2018, 3:14 PM

## 2018-09-11 NOTE — Consult Note (Signed)
   [facility] CM Inpatient Consult   09/11/2018  [name] White 1956-05-20 [id number]   Patient chart has been reviewed for readmissions less than 30 days and for high risk score, 26%, for unplanned readmissions. [facility] Community RN made attempt to engage patient to assess community needs after prior hospital discharge.  Spoke with [name] White by telephone to assess community needs. Patient states that she has need for transportation to get medications from pharmacy and transport to medical appointments. States that she is able to afford the medications. States that she has resided at a motel, but it has been difficult for her to get around because she feels weak. Patient states that she would appreciate any assistance when she transitions home.  Will continue to follow for progression.  Netta Cedars, MSN, [facility] Liaison Nurse Mobile Phone [phone]  Toll free office [phone]

## 2018-09-11 NOTE — Discharge Summary (Signed)
Physician Discharge Summary  [name] White:[id number] DOB: 01/21/1956 DOA: 09/09/2018  PCP: [name], MD  Admit date: 09/09/2018 Discharge date: 09/11/2018  Admitted From: Home Disposition: Home  Recommendations for Outpatient Follow-up:  1. Follow up with PCP in 1-2 weeks 2. Please obtain BMP/CBC in one week 3. Please follow up with Dr. Lamonte Sakai  Home Health yes Equipment/Devices none  Discharge Condition: Stable and improved CODE STATUS full code  diet recommendation: Cardiac Brief/Interim Summary: 63 year old woman with medical problems including chronic hypoxic respiratory failure with a 2 L oxygen requirement, asthma leading to disability, proxysmal atrial fibrillation on anticoagulation with Xarelto, prior pulmonary embolus in 2015, chronic back pain, and anxiety presenting with wheezing and shortness of breath.  The patient has been admitted multiple timesfor respiratory distress since January 2020. The patient was admitted recently from March 4 to September 07, 2018 where she was treated for an asthma exacerbation. Since being discharged, the patient reports persistent wheezing and shortness of breath. She reports she does not have adequate financial resources for food and shelter. She was staying at a motel and ordered food, but became so dyspneic walking to the door she sought medical attention. Associated symptoms include wheezing as well as clear productive cough for about 1 month. Also reports lightheadedness as well as right-sided pleuritic chest pain. She denies nausea, vomiting, diarrhea. Other symptoms reported include obesity, chronic joint pain.  She reports she has not had her Symbicort, but has been using a nebulizer as needed. She has been taking prednisone 40 mg daily since being discharged. She reports she has been taking her Xarelto nightly with dinner. No bleeding reported.  She reports she is never been intubated, became disabled in 2006 she  reports on account of her asthma. 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 She reports prior to her hospitalizations she is living with her mother, believes that the best place for her to live long-term would be with her daughter who lives in [place].   ED Course: In the emergency department vital signs remarkable for increased respiratory rate up to 29, heart rate peak of 258, systolic blood pressure ranging from 100-155. Temperature normal. O2 saturation on 2 L nasal cannula within normal limits. Chest x-ray revealed atelectatic change in the left lower lobe, no edema or consolidation. CBC with white count of 11.3, neutrophil predominant, platelet count of 505. BMP with glucose of 225. BNP of 143.Troponin within normal limits. The patient was given multiple courses of nebulized bronchodilators, 125 mg of methylprednisolone, and IV magnesium. Negative test for influenza. Due to persistent respiratory distress, hospital medicine was consulted for further management.  Discharge Diagnoses:  Active Problems:   COPD exacerbation (HCC)   Shortness of breath   Reactive airway disease   #1 vocal cord dysfunction -this patient does not have COPD or asthma as a diagnosis.  Dr. Agustina Caroli note PFTs not consistent with any of the above.  However she does have vocal cord dysfunction.  She was recommended to stop Symbicort and continue Flonase.  She is now on 2 L of oxygen.  She is at home on 2 L of oxygen 24/7.  Her body is very deconditioned and she needs outpatient PT with home health PT.  I will also order speech therapy as an outpatient to help with her vocal cord dysfunction.  In addition to this she probably has sleep apnea with morbid obesity.  She was told to follow-up with the sleep specialist to obtain a sleep study.  #  2 paroxysmal atrial fibrillation status post ablation continue Xarelto beta-blocker and calcium channel blocker and flecainide.  Rate  controlled.  History of pulmonary embolism continue Xarelto  Anxiety and depression continue home medications.  Hypokalemia/hypomagnesemia repleted and back to normal.   Estimated body mass index is 53.12 kg/m as calculated from the following:   Height as of this encounter: 5\' 5"  (1.651 m).   Weight as of this encounter: 144.8 kg.  Discharge Instructions   Allergies as of 09/11/2018      Reactions   Caffeine Other (See Comments)   Migraine   Vicodin [hydrocodone-acetaminophen] Nausea And Vomiting, Other (See Comments)   Headache    Hydralazine Hcl    Other reaction(s): Other (See Comments) AKI leading to rhabdo and electrolyte abnormalities   Hydrocodone-acetaminophen Nausea And Vomiting   Ciprofloxacin Hives, Rash   Erythromycin Hives, Rash   Lisinopril Cough   Other reaction(s): Cough   Oxycodone Nausea And Vomiting   headache Other reaction(s): Vomiting   Sulfamethoxazole-trimethoprim Rash      Medication List    STOP taking these medications   acetaminophen 650 MG CR tablet Commonly known as:  TYLENOL Replaced by:  acetaminophen 325 MG tablet   budesonide-formoterol 160-4.5 MCG/ACT inhaler Commonly known as:  Symbicort     TAKE these medications   acetaminophen 325 MG tablet Commonly known as:  TYLENOL Take 2 tablets (650 mg total) by mouth every 6 (six) hours as needed for mild pain. Replaces:  acetaminophen 650 MG CR tablet   albuterol 108 (90 Base) MCG/ACT inhaler Commonly known as:  PROVENTIL HFA;VENTOLIN HFA Inhale 2 puffs into the lungs every 6 (six) hours as needed for wheezing or shortness of breath.   albuterol (2.5 MG/3ML) 0.083% nebulizer solution Commonly known as:  PROVENTIL Take 3 mLs (2.5 mg total) by nebulization every 6 (six) hours as needed for wheezing or shortness of breath.   azelastine 0.05 % ophthalmic solution Commonly known as:  OPTIVAR Place 1 drop into both eyes daily.   cycloSPORINE 0.05 % ophthalmic emulsion Commonly  known as:  RESTASIS Place 1 drop into both eyes 2 (two) times daily.   diclofenac sodium 1 % Gel Commonly known as:  VOLTAREN APPLY 2 GRAMS EXTERNALLY TO THE AFFECTED AREA FOUR TIMES DAILY What changed:  See the new instructions.   diltiazem 120 MG 24 hr capsule Commonly known as:  CARDIZEM CD Take 1 capsule (120 mg total) by mouth daily. Please keep upcoming appt in January before anymore refills. Final Attempt What changed:  additional instructions   flecainide 100 MG tablet Commonly known as:  TAMBOCOR Take 1 tablet (100 mg total) by mouth 2 (two) times daily. Please keep upcoming appt in January before anymore refills. Final Attempt What changed:  additional instructions   fluticasone 50 MCG/ACT nasal spray Commonly known as:  Flonase Place 1 spray into both nostrils daily.   guaiFENesin 600 MG 12 hr tablet Commonly known as:  MUCINEX Take 2 tablets (1,200 mg total) by mouth 2 (two) times daily for 5 days.   hydrocortisone 25 MG suppository Commonly known as:  ANUSOL-HC Place 1 suppository (25 mg total) rectally 2 (two) times daily.   hydrOXYzine 25 MG tablet Commonly known as:  ATARAX/VISTARIL Take 25 mg by mouth 2 (two) times daily as needed for anxiety.   loratadine 10 MG tablet Commonly known as:  CLARITIN Take 10 mg by mouth daily.   magnesium oxide 400 (241.3 Mg) MG tablet Commonly known as:  MAG-OX Take  1 tablet (400 mg total) by mouth 2 (two) times daily.   metoprolol tartrate 25 MG tablet Commonly known as:  LOPRESSOR Take 1 tablet (25 mg total) by mouth 2 (two) times daily.   pantoprazole 40 MG tablet Commonly known as:  PROTONIX Take 1 tablet (40 mg total) by mouth 2 (two) times daily.   potassium chloride SA 20 MEQ tablet Commonly known as:  K-DUR,KLOR-CON Take 2 tablets (40 mEq total) by mouth 2 (two) times daily.   predniSONE 10 MG tablet Commonly known as:  DELTASONE Take 4 pills for 3 days then 3 pills for 3 days then 2 pills for 3 days then  1 pill for 3 days then stop   rivaroxaban 20 MG Tabs tablet Commonly known as:  Xarelto TAKE 1 TABLET(20 MG) BY MOUTH DAILY AFTER SUPPER   venlafaxine XR 150 MG 24 hr capsule Commonly known as:  EFFEXOR-XR Take 150 mg by mouth daily with breakfast.   witch hazel-glycerin pad Commonly known as:  TUCKS Apply topically as needed for itching.      Follow-up Information    [name], MD Follow up.   Specialty:  Family Medicine Contact information: [address] [address] [phone]          Allergies  Allergen Reactions  . Caffeine Other (See Comments)    Migraine  . Vicodin [Hydrocodone-Acetaminophen] Nausea And Vomiting and Other (See Comments)    Headache   . Hydralazine Hcl     Other reaction(s): Other (See Comments) AKI leading to rhabdo and electrolyte abnormalities  . Hydrocodone-Acetaminophen Nausea And Vomiting  . Ciprofloxacin Hives and Rash  . Erythromycin Hives and Rash  . Lisinopril Cough    Other reaction(s): Cough  . Oxycodone Nausea And Vomiting    headache Other reaction(s): Vomiting  . Sulfamethoxazole-Trimethoprim Rash    Consultations: None  Procedures/Studies: Dg Ribs Unilateral Right  Result Date: 08/30/2018 CLINICAL DATA:  Pt reported mid anterior right sided chest pain that started yesterday. Medical hx of asthma and HTN. EXAM: RIGHT RIBS - 2 VIEW COMPARISON:  None. FINDINGS: No displaced RIGHT rib fracture. No pneumothorax. No chest wall abnormality. IMPRESSION: No rib fracture.  No pneumothorax. Electronically Signed   By: Suzy Bouchard M.D.   On: 08/30/2018 10:28   Ct Chest W Contrast  Result Date: 09/10/2018 CLINICAL DATA:  Shortness of breath. EXAM: CT CHEST WITH CONTRAST TECHNIQUE: Multidetector CT imaging of the chest was performed during intravenous contrast administration. CONTRAST:  91mL OMNIPAQUE IOHEXOL 300 MG/ML  SOLN COMPARISON:  Chest x-ray from yesterday. CT chest dated March 22, 2015.  FINDINGS: Cardiovascular: Normal heart size. No pericardial effusion. No thoracic aortic aneurysm. No central pulmonary embolism. Mediastinum/Nodes: No enlarged mediastinal, hilar, or axillary lymph nodes. Prior right hemithyroidectomy. The trachea and esophagus demonstrate no significant findings. Lungs/Pleura: No focal consolidation, pleural effusion, or pneumothorax. Postinflammatory bronchiectasis and scarring in the medial right lower lobe. Minimal bilateral lower lobe subsegmental atelectasis. No suspicious pulmonary nodule. Upper Abdomen: No acute abnormality. Slight interval increase in size of the now 1.4 cm simple cyst in the left hepatic lobe. Musculoskeletal: Acute nondisplaced fracture of the right anterolateral sixth rib. Multilevel advanced thoracic spondylosis. Chronic elevation of the right hemidiaphragm. IMPRESSION: 1.  No acute intrathoracic process. 2. Acute nondisplaced fracture of the right anterolateral sixth rib. Electronically Signed   By: Titus Dubin M.D.   On: 09/10/2018 16:39   Dg Chest Port 1 View  Result Date: 09/09/2018 CLINICAL DATA:  Shortness  of breath EXAM: PORTABLE CHEST 1 VIEW COMPARISON:  August 29, 2018 FINDINGS: There is atelectatic change in the left lower lobe. There is no edema or consolidation. Heart is upper normal in size with pulmonary vascularity normal. No adenopathy. There is aortic atherosclerosis. No bone lesions. IMPRESSION: Left lower lobe atelectatic change. No edema or consolidation. Stable cardiac silhouette. Aortic Atherosclerosis (ICD10-I70.0). Electronically Signed   By: Lowella Grip III M.D.   On: 09/09/2018 19:32   Dg Chest Portable 1 View  Result Date: 08/29/2018 CLINICAL DATA:  Shortness of breath since 10:45 a.m. today. EXAM: PORTABLE CHEST 1 VIEW COMPARISON:  08/07/2018. FINDINGS: Poor inspiration. The most inferior portion of the left lung base is not included. The right hemidiaphragm remains mildly elevated. Mild right inferior  perihilar atelectasis. Patchy airspace opacity at the left lung base. Stable borderline enlarged cardiac silhouette and tortuous and partially calcified thoracic aorta. Mild to moderate bilateral AC joint degenerative spur formation. IMPRESSION: Poor inspiration with mild right inferior perihilar atelectasis and moderate left basilar pneumonia or patchy atelectasis. Electronically Signed   By: Claudie Revering M.D.   On: 08/29/2018 14:09   Vas [place] Lower Extremity Venous (dvt)  Result Date: 09/03/2018  Lower Venous Study Indications: Edema.  Performing Technologist: Oliver Hum RVT  Examination Guidelines: A complete evaluation includes B-mode imaging, spectral Doppler, color Doppler, and power Doppler as needed of all accessible portions of each vessel. Bilateral testing is considered an integral part of a complete examination. Limited examinations for reoccurring indications may be performed as noted.  Right Venous Findings: +---------+---------------+---------+-----------+----------+-------+          CompressibilityPhasicitySpontaneityPropertiesSummary +---------+---------------+---------+-----------+----------+-------+ CFV      Full           Yes      Yes                          +---------+---------------+---------+-----------+----------+-------+ SFJ      Full                                                 +---------+---------------+---------+-----------+----------+-------+ FV Prox  Full                                                 +---------+---------------+---------+-----------+----------+-------+ FV Mid   Full                                                 +---------+---------------+---------+-----------+----------+-------+ FV DistalFull                                                 +---------+---------------+---------+-----------+----------+-------+ PFV      Full                                                  +---------+---------------+---------+-----------+----------+-------+  POP      Full           Yes      Yes                          +---------+---------------+---------+-----------+----------+-------+ PTV      Full                                                 +---------+---------------+---------+-----------+----------+-------+ PERO     Full                                                 +---------+---------------+---------+-----------+----------+-------+  Left Venous Findings: +---------+---------------+---------+-----------+----------+-------+          CompressibilityPhasicitySpontaneityPropertiesSummary +---------+---------------+---------+-----------+----------+-------+ CFV      Full           Yes      Yes                          +---------+---------------+---------+-----------+----------+-------+ SFJ      Full                                                 +---------+---------------+---------+-----------+----------+-------+ FV Prox  Full                                                 +---------+---------------+---------+-----------+----------+-------+ FV Mid   Full                                                 +---------+---------------+---------+-----------+----------+-------+ FV DistalFull                                                 +---------+---------------+---------+-----------+----------+-------+ PFV      Full                                                 +---------+---------------+---------+-----------+----------+-------+ POP      Full           Yes      Yes                          +---------+---------------+---------+-----------+----------+-------+ PTV      Full                                                 +---------+---------------+---------+-----------+----------+-------+  PERO     Full                                                  +---------+---------------+---------+-----------+----------+-------+    Summary: Right: There is no evidence of deep vein thrombosis in the lower extremity. No cystic structure found in the popliteal fossa. Left: There is no evidence of deep vein thrombosis in the lower extremity. No cystic structure found in the popliteal fossa.  *See table(s) above for measurements and observations. Electronically signed by Ruta Hinds MD on 09/03/2018 at 5:06:48 PM.    Final     (Echo, Carotid, EGD, Colonoscopy, ERCP)    Subjective:   Discharge Exam: Vitals:   09/10/18 2103 09/11/18 0513  BP: 139/76 (!) 153/91  Pulse: 90 71  Resp: 18 18  Temp: 98.4 F (36.9 C) 98.4 F (36.9 C)  SpO2: 99% 97%   Vitals:   09/10/18 1441 09/10/18 1951 09/10/18 2103 09/11/18 0513  BP: (!) 158/90  139/76 (!) 153/91  Pulse: 76  90 71  Resp: 18  18 18   Temp: 98.1 F (36.7 C)  98.4 F (36.9 C) 98.4 F (36.9 C)  TempSrc: Oral  Oral Oral  SpO2: 99% 98% 99% 97%  Weight:    (!) 144.8 kg  Height:        General: Pt is alert, awake, not in acute distress Cardiovascular: RRR, S1/S2 +, no rubs, no gallops Respiratory: Few scattered wheezing bilaterally, no wheezing, no rhonchi Abdominal: Soft, NT, ND, bowel sounds + Extremities: no edema, no cyanosis    The results of significant diagnostics from this hospitalization (including imaging, microbiology, ancillary and laboratory) are listed below for reference.     Microbiology: Recent Results (from the past 240 hour(s))  Respiratory Panel by PCR     Status: None   Collection Time: 09/09/18 11:24 PM  Result Value Ref Range Status   Adenovirus NOT DETECTED NOT DETECTED Final   Coronavirus 229E NOT DETECTED NOT DETECTED Final    Comment: (NOTE) The Coronavirus on the Respiratory Panel, DOES NOT test for the novel  Coronavirus (2019 nCoV)    Coronavirus HKU1 NOT DETECTED NOT DETECTED Final   Coronavirus NL63 NOT DETECTED NOT DETECTED Final   Coronavirus OC43  NOT DETECTED NOT DETECTED Final   Metapneumovirus NOT DETECTED NOT DETECTED Final   Rhinovirus / Enterovirus NOT DETECTED NOT DETECTED Final   Influenza A NOT DETECTED NOT DETECTED Final   Influenza B NOT DETECTED NOT DETECTED Final   Parainfluenza Virus 1 NOT DETECTED NOT DETECTED Final   Parainfluenza Virus 2 NOT DETECTED NOT DETECTED Final   Parainfluenza Virus 3 NOT DETECTED NOT DETECTED Final   Parainfluenza Virus 4 NOT DETECTED NOT DETECTED Final   Respiratory Syncytial Virus NOT DETECTED NOT DETECTED Final   Bordetella pertussis NOT DETECTED NOT DETECTED Final   Chlamydophila pneumoniae NOT DETECTED NOT DETECTED Final   Mycoplasma pneumoniae NOT DETECTED NOT DETECTED Final    Comment: Performed at [facility], [address], [place], [place] [address]     Labs: BNP (last 3 results) Recent Labs    07/01/18 0836 09/09/18 1850  BNP 22.4 443.1*   Basic Metabolic Panel: Recent Labs  Lab 09/05/18 0438 09/09/18 1849 09/10/18 0424 09/11/18 0345  NA  --  140 137 137  K  --  3.8 3.4*  4.2  CL  --  105 102 103  CO2  --  25 24 25   GLUCOSE  --  225* 249* 141*  BUN  --  16 19 21   CREATININE 0.63 0.74 0.59 0.68  CALCIUM  --  8.6* 8.6* 8.8*  MG  --   --   --  2.6*   Liver Function Tests: No results for input(s): AST, ALT, ALKPHOS, BILITOT, PROT, ALBUMIN in the last 168 hours. No results for input(s): LIPASE, AMYLASE in the last 168 hours. No results for input(s): AMMONIA in the last 168 hours. CBC: Recent Labs  Lab 09/09/18 1849 09/10/18 0424  WBC 11.3* 11.5*  NEUTROABS 9.1*  --   HGB 12.3 11.5*  HCT 40.2 39.2  MCV 98.3 99.7  PLT 505* 475*   Cardiac Enzymes: No results for input(s): CKTOTAL, CKMB, CKMBINDEX, TROPONINI in the last 168 hours. BNP: Invalid input(s): POCBNP CBG: Recent Labs  Lab 09/10/18 1136 09/10/18 1709 09/10/18 2107 09/11/18 0744 09/11/18 1213  GLUCAP 207* 158* 258* 222* 225*   D-Dimer No results for input(s): DDIMER in the last  72 hours. Hgb A1c Recent Labs    09/10/18 0424  HGBA1C 6.2*   Lipid Profile No results for input(s): CHOL, HDL, LDLCALC, TRIG, CHOLHDL, LDLDIRECT in the last 72 hours. Thyroid function studies No results for input(s): TSH, T4TOTAL, T3FREE, THYROIDAB in the last 72 hours.  Invalid input(s): FREET3 Anemia work up No results for input(s): VITAMINB12, FOLATE, FERRITIN, TIBC, IRON, RETICCTPCT in the last 72 hours. Urinalysis    Component Value Date/Time   COLORURINE Yellow 10/02/2014 [place] 01/31/2014 0745   APPEARANCEUR Clear 10/02/2014 1645   LABSPEC 1.018 10/02/2014 1645   PHURINE 6.0 10/02/2014 1645   PHURINE 6.5 01/31/2014 0745   GLUCOSEU Negative 10/02/2014 1645   HGBUR Negative 10/02/2014 1645   HGBUR TRACE (A) 01/31/2014 0745   BILIRUBINUR Negative 10/02/2014 [place] Negative 10/02/2014 1645   KETONESUR 15 (A) 01/31/2014 0745   PROTEINUR Negative 10/02/2014 1645   PROTEINUR NEGATIVE 01/31/2014 0745   UROBILINOGEN 1.0 01/31/2014 0745   NITRITE Negative 10/02/2014 1645   NITRITE NEGATIVE 01/31/2014 0745   LEUKOCYTESUR Negative 10/02/2014 1645   Sepsis Labs Invalid input(s): PROCALCITONIN,  WBC,  LACTICIDVEN Microbiology Recent Results (from the past 240 hour(s))  Respiratory Panel by PCR     Status: None   Collection Time: 09/09/18 11:24 PM  Result Value Ref Range Status   Adenovirus NOT DETECTED NOT DETECTED Final   Coronavirus 229E NOT DETECTED NOT DETECTED Final    Comment: (NOTE) The Coronavirus on the Respiratory Panel, DOES NOT test for the novel  Coronavirus (2019 nCoV)    Coronavirus HKU1 NOT DETECTED NOT DETECTED Final   Coronavirus NL63 NOT DETECTED NOT DETECTED Final   Coronavirus OC43 NOT DETECTED NOT DETECTED Final   Metapneumovirus NOT DETECTED NOT DETECTED Final   Rhinovirus / Enterovirus NOT DETECTED NOT DETECTED Final   Influenza A NOT DETECTED NOT DETECTED Final   Influenza B NOT DETECTED NOT DETECTED Final    Parainfluenza Virus 1 NOT DETECTED NOT DETECTED Final   Parainfluenza Virus 2 NOT DETECTED NOT DETECTED Final   Parainfluenza Virus 3 NOT DETECTED NOT DETECTED Final   Parainfluenza Virus 4 NOT DETECTED NOT DETECTED Final   Respiratory Syncytial Virus NOT DETECTED NOT DETECTED Final   Bordetella pertussis NOT DETECTED NOT DETECTED Final   Chlamydophila pneumoniae NOT DETECTED NOT DETECTED Final   Mycoplasma pneumoniae NOT DETECTED NOT DETECTED  Final    Comment: Performed at [facility], [place] [address], [place], [place] [address]     Time coordinating discharge: 33  minutes  SIGNED:   Georgette Shell, MD  [facility] 09/11/2018, 12:38 PM Pager   If 7PM-7AM, please contact night-coverage [email or web address] Password [email or web address]

## 2018-09-11 NOTE — TOC Initial Note (Signed)
Transition of Care [facility]) - Initial/Assessment Note    Patient Details  Name: [name] White MRN: [id number] Date of Birth: August 23, 1955  Transition of Care [facility]) CM/SW Contact:    Lia Hopping, [place] Phone Number: 09/11/2018, 4:19 PM  Clinical Narrative:                 The patient was admitted recently from March 4 to September 07, 2018 where she was treated for an asthma exacerbation.  Since being discharged, the patient reports persistent wheezing and shortness of breath.  She reports she does not have adequate financial resources for food and shelter. She was staying at a motel and ordered food, but became so dyspneic walking to the door she sought medical attention. Patient presented with shortness of breath and wheezing.  Patient insurance denied rehab stay on 3/13.  Patient feels that going to rehab will help her regain her independence.  CSW discussed with physician and RNCM.   CSW reached out to patient SNF choice [facility]. SNF initiated [employer] authorization.  Expected Discharge Plan: Skilled Nursing Facility Barriers to Discharge: Insurance Authorization, SNF Authorization Denied(Insurance denied last admission 3/13)   Patient Goals and CMS Choice Patient states their goals for this hospitalization and ongoing recovery are:: Pt. wants to get better CMS Medicare.gov Compare Post Acute Care list provided to:: Patient Choice offered to / list presented to : Patient  Expected Discharge Plan and Services Expected Discharge Plan: [place] Discharge Planning Services: CM Consult   Living arrangements for the past 2 months: Single Family Home/Motel/ SNF Expected Discharge Date: 09/11/18               DME Arranged: Oxygen DME Agency: [facility] HH Arranged: PT, RN [place] Agency: [place] ([facility])  Prior Living Arrangements/Services Living arrangements for the past 2 months: Single Family Home Lives with:: Relatives Patient  language and need for interpreter reviewed:: No Do you feel safe going back to the place where you live?: Yes      Need for Family Participation in Patient Care: No (Comment) Care giver support system in place?: No (comment) Current home services: Home PT, Home RN Criminal Activity/Legal Involvement Pertinent to Current Situation/Hospitalization: No - Comment as needed  Activities of Daily Living Home Assistive Devices/Equipment: Eyeglasses, Cane (specify quad or straight)(quad cane) ADL Screening (condition at time of admission) Patient's cognitive ability adequate to safely complete daily activities?: Yes Is the patient deaf or have difficulty hearing?: No Does the patient have difficulty seeing, even when wearing glasses/contacts?: No Does the patient have difficulty concentrating, remembering, or making decisions?: No Patient able to express need for assistance with ADLs?: Yes Does the patient have difficulty dressing or bathing?: Yes Independently performs ADLs?: No Communication: Independent Dressing (OT): Needs assistance Is this a change from baseline?: Pre-admission baseline Grooming: Needs assistance Is this a change from baseline?: Pre-admission baseline Feeding: Independent Bathing: Needs assistance Is this a change from baseline?: Pre-admission baseline Toileting: Needs assistance Is this a change from baseline?: Pre-admission baseline In/Out Bed: Needs assistance Is this a change from baseline?: Pre-admission baseline Walks in Home: Needs assistance Is this a change from baseline?: Pre-admission baseline Does the patient have difficulty walking or climbing stairs?: Yes Weakness of Legs: Both Weakness of Arms/Hands: Both  Permission Sought/Granted Permission sought to share information with : Case Manager, Customer service manager Permission granted to share information with : Yes, Verbal Permission Granted     Permission granted to share info w  AGENCY:  Radiation protection practitioner Appearance:: Well-Groomed, Appears stated age Attitude/Demeanor/Rapport: Engaged, Self-Confident Affect (typically observed): Accepting, Calm Orientation: : Oriented to Self, Oriented to Place, Oriented to  Time, Oriented to Situation Alcohol / Substance Use: Not Applicable Psych Involvement: No (comment)  Admission diagnosis:  COPD exacerbation ([place]) [J44.1] Patient Active Problem List   Diagnosis Date Noted  . Reactive airway disease 09/10/2018  . Shortness of breath 09/09/2018  . COPD exacerbation ([place]) 08/29/2018  . Acute bronchitis 08/29/2018  . Acute on chronic respiratory failure with hypoxia ([place]) 08/29/2018  . Sleep apnea 07/05/2018  . Chronic respiratory failure with hypoxia ([place]) 07/05/2018  . Osteoarthritis 03/02/2018  . Viral illness 02/13/2018  . Nausea 02/13/2018  . Left medial tibial stress syndrome 12/26/2017  . Hematoma 11/27/2017  . Severe episode of recurrent major depressive disorder, without psychotic features ([place])   . MDD (major depressive disorder), recurrent episode, severe ([place]) 09/07/2015  . Atrial flutter ([place]) 07/29/2015  . History of atrial flutter 06/26/2015  . Chest pain 03/22/2015  . Asthma 11/12/2014  . History of hypertension 11/05/2014  . Atrial fibrillation, chronic 11/03/2014  . Chronic anticoagulation 11/03/2014  . History of pulmonary embolus (PE) 11/03/2014  . Non-traumatic rhabdomyolysis 11/03/2014  . Major depressive disorder 11/03/2014  . SOB (shortness of breath)   . MDD (major depressive disorder), recurrent severe, without psychosis ([place]) 03/05/2014  . Pulmonary embolism ([place]) 12/27/2013  . Vocal cord dysfunction 11/12/2013  . Mixed headache 01/06/2012  . OVERACTIVE BLADDER 04/18/2008  . Osteoarthritis of both knees 04/18/2008  . POLYARTHRITIS 09/14/2007  . Morbid obesity ([place]) 08/24/2006  . RESTLESS LEGS SYNDROME 08/24/2006  . HYPERTENSION, BENIGN SYSTEMIC 08/24/2006  .  RHINITIS, ALLERGIC 08/24/2006  . GASTROESOPHAGEAL REFLUX, NO ESOPHAGITIS 08/24/2006   PCP:  [name], MD Pharmacy:   [facility] [place], [address] AT [address] [place] [address] Phone: [phone] Fax: [phone]  [facility] - [place] [place], [place] [address] Phone: [phone] Fax: [phone]  [id number] - [place], [place] - [address] AT [place] [address] [place] [address] Phone: [phone] Fax: [phone]     Social Determinants of Health (SDOH) Interventions    Readmission Risk Interventions 30 Day Unplanned Readmission Risk Score     ED to Hosp-Admission (Current) from 09/09/2018 in [place]  30 Day Unplanned Readmission Risk Score (%)  26 Filed at 09/11/2018 1600     This score is the patient's risk of an unplanned readmission within 30 days of being discharged (0 -100%). The score is based on dignosis, age, lab data, medications, orders, and past utilization.   Low:  0-14.9   Medium: 15-21.9   High: 22-29.9   Extreme: 30 and above       No flowsheet data found.

## 2018-09-12 DIAGNOSIS — I4891 Unspecified atrial fibrillation: Secondary | ICD-10-CM

## 2018-09-12 DIAGNOSIS — J383 Other diseases of vocal cords: Principal | ICD-10-CM

## 2018-09-12 LAB — GLUCOSE, CAPILLARY
Glucose-Capillary: 115 mg/dL — ABNORMAL HIGH (ref 70–99)
Glucose-Capillary: 164 mg/dL — ABNORMAL HIGH (ref 70–99)
Glucose-Capillary: 126 mg/dL — ABNORMAL HIGH (ref 70–99)
Glucose-Capillary: 212 mg/dL — ABNORMAL HIGH (ref 70–99)

## 2018-09-12 MED ORDER — METHYLPREDNISOLONE SODIUM SUCC 40 MG IJ SOLR
40.0000 mg | Freq: Every day | INTRAMUSCULAR | Status: DC
  Administered 2018-09-13: 40 mg via INTRAVENOUS
  Filled 2018-09-12: qty 1

## 2018-09-12 NOTE — Evaluation (Addendum)
Occupational Therapy Evaluation Patient Details Name: [name] White MRN: [id number] DOB: 1955-11-15 Today's Date: 09/12/2018    History of Present Illness 63 yo female admitted with dyspnea 2* asthma exacerbation. hx of COPD-O2 dep, depression, PE, A fib, morbid obesity, asthma, chronic pain/knee OA, anxiety   Clinical Impression   Pt was admitted for the above.  She was at [facility] for rehab and recently sent to [facility], returned to SNF and is back again for SOB.  Pt will benefit from continued OT to increase safety and independence with adls. Pt has limited activity tolerance at this time.  Started education with AE on this visit. Will continue to follow in acute setting with supervision/set up level goals.  Recommend SNF upon d/c to restore I with adls/iadls as she lives alone     Follow Up Recommendations  SNF    Equipment Recommendations    to be further assessed, ? 3:1   Recommendations for Other Services       Precautions / Restrictions Precautions Precautions: Fall Precaution Comments: chronic 2L O2; 2 falls in January 2020 Restrictions Weight Bearing Restrictions: No      Mobility Bed Mobility         Supine to sit: Supervision        Transfers   Equipment used: None   Sit to Stand: Min guard Stand pivot transfers: Min guard       General transfer comment: for safety. Held to bedrail and armrest of chair Wheezing and dyspnic with transfer     Balance                                           ADL either performed or assessed with clinical judgement   ADL   Eating/Feeding: Independent   Grooming: Set up   Upper Body Bathing: Set up;Sitting   Lower Body Bathing: Minimal assistance;Sit to/from stand   Upper Body Dressing : Set up;Sitting   Lower Body Dressing: Minimal assistance;Sit to/from stand;With adaptive equipment   Toilet Transfer: Min guard;Stand-pivot(chair)             General ADL Comments: pt got up to chair:   wheezy and dyspnic 2/4 with activity.  Educated on Public affairs consultant. Assisted with sock aide to apply sock     Vision         Perception     Praxis      Pertinent Vitals/Pain Pain Assessment: No/denies pain     Hand Dominance     Extremity/Trunk Assessment Upper Extremity Assessment Upper Extremity Assessment: Generalized weakness           Communication Communication Communication: No difficulties   Cognition Arousal/Alertness: Awake/alert Behavior During Therapy: WFL for tasks assessed/performed Overall Cognitive Status: Within Functional Limits for tasks assessed                                     General Comments       Exercises     Shoulder Instructions      Home Living Family/patient expects to be discharged to:: Unsure Living Arrangements: Alone                               Additional Comments: was at rehab at  [facility]; hopes to return.  Has tub shower and standard commode at home      Prior Functioning/Environment          Comments: needed assist for adls at snf        OT Problem List: Decreased strength;Decreased activity tolerance;Cardiopulmonary status limiting activity;Impaired balance (sitting and/or standing)      OT Treatment/Interventions: Self-care/ADL training;DME and/or AE instruction;Patient/family education;Balance training;Therapeutic activities    OT Goals(Current goals can be found in the care plan section) Acute Rehab OT Goals Patient Stated Goal: rehab to regain independence OT Goal Formulation: With patient ADL Goals Pt Will Transfer to Toilet: with supervision;ambulating;bedside commode Pt Will Perform Toileting - Clothing Manipulation and hygiene: with modified independence;sit to/from stand Additional ADL Goal #1: pt will perform ADL with setup/supervision with AE Additional ADL Goal #2: Pt will initiate at least one rest break without cues for energy conservation  OT  Frequency: Min 2X/week   Barriers to D/C:            Co-evaluation              AM-PAC OT "6 Clicks" Daily Activity     Outcome Measure Help from another person eating meals?: None Help from another person taking care of personal grooming?: A Little Help from another person toileting, which includes using toliet, bedpan, or urinal?: A Little Help from another person bathing (including washing, rinsing, drying)?: A Little Help from another person to put on and taking off regular upper body clothing?: A Little Help from another person to put on and taking off regular lower body clothing?: A Little 6 Click Score: 19   End of Session    Activity Tolerance: Patient limited by fatigue Patient left: in chair;with call bell/phone within reach;with chair alarm set  OT Visit Diagnosis: Muscle weakness (generalized) (M62.81)                Time: 5374-8270 OT Time Calculation (min): 23 min Charges:  OT General Charges $OT Visit: 1 Visit OT Evaluation $OT Eval Low Complexity: [place], OTR/L Acute Rehabilitation Services [phone] [facility] pager [phone] office 09/12/2018  [place] 09/12/2018, 12:57 PM

## 2018-09-12 NOTE — Progress Notes (Signed)
OT Cancellation Note  Patient Details Name: QUANESHIA WAREING MRN: [id number] DOB: 07-Sep-1955   Cancelled Treatment:    Reason Eval/Treat Not Completed: Other (comment). Pt just started breakfast. Will check back later today  Cayman Brogden 09/12/2018, 9:53 AM  Lesle Chris, OTR/L Acute Rehabilitation Services [phone] [facility] pager [phone] office 09/12/2018

## 2018-09-12 NOTE — Progress Notes (Signed)
Patient insurance approved SNF placement for short rehab. Patient can transfer in the am. Patient notified at bedside.   Kathrin Greathouse, Marlinda Mike, MSW Clinical Social Worker  [phone] 09/12/2018  4:54 PM

## 2018-09-12 NOTE — Progress Notes (Signed)
PROGRESS NOTE    [name] White  GXQ:[id number] DOB: [birth year] DOA: 09/09/2018 PCP: [name], MD   Brief Narrative: [name] White is a 63 y.o. female with a history of asthma, chronic respiratory failure with 2L O2 chronically at home, chronic back pain, depression, GERD, hypertension, hyperthyroidism, migraine, osteoarthritis, paroxysmal atrial fibrillation, and history of pulmonary embolus. She presented secondary to acute respiratory distress and found to have a COPD exacerbation.   Assessment & Plan:   Active Problems:   COPD exacerbation (HCC)   Shortness of breath   Reactive airway disease   Vocal cord dysfunction Readmission. Initially treated as a COPD exacerbation. Appears to be back to baseline with regard to wheezing. -PT/OT recommending SNF -Decrease solu-medrol -Outpatient pulmonology/ENT/speech therapy follow-up  Cronic respiratory failure with hypoxia Patient is chronically on 2 L via nasal canula.  -Continue oxygen supplementation  Chronic atrial fibrillation Rate controlled. On anticoagulation and CCC/antiarrhythmic/BB -Continue Xarelto -Continue flecainide, diltiazem, metoprolol  Depression -Continue Effexor  GERD -Continue Protonix  History of pulmonary embolism -Continue Xarelto  Post-nasal drip -Continue Flonase and Claritin   DVT prophylaxis: Xarelto Code Status:   Code Status: Full Code Family Communication: None at bedside Disposition Plan: Discharge to SNF when bed available.   Consultants:   None  Procedures:   None  Antimicrobials:  Azithromycin    Subjective: Wheezing and cough improved from yesterday.  Objective: Vitals:   09/11/18 2111 09/12/18 0445 09/12/18 0500 09/12/18 0850  BP: (!) 141/76 (!) 148/89    Pulse: 89 65    Resp: 20 18    Temp: 98.3 F (36.8 C) 98.7 F (37.1 C)    TempSrc: Oral Oral    SpO2: 97% 99%  99%  Weight:   (!) 142.6 kg   Height:        Intake/Output Summary (Last 24  hours) at 09/12/2018 1251 Last data filed at 09/12/2018 1212 Gross per 24 hour  Intake 480 ml  Output 525 ml  Net -45 ml   Filed Weights   09/10/18 0500 09/11/18 0513 09/12/18 0500  Weight: (!) 142.9 kg (!) 144.8 kg (!) 142.6 kg    Examination:  General exam: Appears calm and comfortable Respiratory system: Clear to auscultation. Respiratory effort normal. Cardiovascular system: S1 & S2 heard, RRR. No murmurs, rubs, gallops or clicks. Gastrointestinal system: Abdomen is nondistended, soft and nontender. No organomegaly or masses felt. Normal bowel sounds heard. Central nervous system: Alert and oriented. No focal neurological deficits. Extremities: No edema. No calf tenderness Skin: No cyanosis. No rashes Psychiatry: Judgement and insight appear normal. Mood & affect appropriate.     Data Reviewed: I have personally reviewed following labs and imaging studies  CBC: Recent Labs  Lab 09/09/18 1849 09/10/18 0424  WBC 11.3* 11.5*  NEUTROABS 9.1*  --   HGB 12.3 11.5*  HCT 40.2 39.2  MCV 98.3 99.7  PLT 505* 144*   Basic Metabolic Panel: Recent Labs  Lab 09/09/18 1849 09/10/18 0424 09/11/18 0345  NA 140 137 137  K 3.8 3.4* 4.2  CL 105 102 103  CO2 25 24 25   GLUCOSE 225* 249* 141*  BUN 16 19 21   CREATININE 0.74 0.59 0.68  CALCIUM 8.6* 8.6* 8.8*  MG  --   --  2.6*   GFR: Estimated Creatinine Clearance: 103.6 mL/min (by C-G formula based on SCr of 0.68 mg/dL). Liver Function Tests: No results for input(s): AST, ALT, ALKPHOS, BILITOT, PROT, ALBUMIN in the last 168 hours. No results for input(s):  LIPASE, AMYLASE in the last 168 hours. No results for input(s): AMMONIA in the last 168 hours. Coagulation Profile: No results for input(s): INR, PROTIME in the last 168 hours. Cardiac Enzymes: No results for input(s): CKTOTAL, CKMB, CKMBINDEX, TROPONINI in the last 168 hours. BNP (last 3 results) No results for input(s): PROBNP in the last 8760 hours. HbA1C: Recent Labs     09/10/18 0424  HGBA1C 6.2*   CBG: Recent Labs  Lab 09/11/18 1213 09/11/18 1658 09/11/18 2113 09/12/18 0735 09/12/18 1209  GLUCAP 225* 161* 237* 115* 164*   Lipid Profile: No results for input(s): CHOL, HDL, LDLCALC, TRIG, CHOLHDL, LDLDIRECT in the last 72 hours. Thyroid Function Tests: No results for input(s): TSH, T4TOTAL, FREET4, T3FREE, THYROIDAB in the last 72 hours. Anemia Panel: No results for input(s): VITAMINB12, FOLATE, FERRITIN, TIBC, IRON, RETICCTPCT in the last 72 hours. Sepsis Labs: No results for input(s): PROCALCITON, LATICACIDVEN in the last 168 hours.  Recent Results (from the past 240 hour(s))  Respiratory Panel by PCR     Status: None   Collection Time: 09/09/18 11:24 PM  Result Value Ref Range Status   Adenovirus NOT DETECTED NOT DETECTED Final   Coronavirus 229E NOT DETECTED NOT DETECTED Final    Comment: (NOTE) The Coronavirus on the Respiratory Panel, DOES NOT test for the novel  Coronavirus (2019 nCoV)    Coronavirus HKU1 NOT DETECTED NOT DETECTED Final   Coronavirus NL63 NOT DETECTED NOT DETECTED Final   Coronavirus OC43 NOT DETECTED NOT DETECTED Final   Metapneumovirus NOT DETECTED NOT DETECTED Final   Rhinovirus / Enterovirus NOT DETECTED NOT DETECTED Final   Influenza A NOT DETECTED NOT DETECTED Final   Influenza B NOT DETECTED NOT DETECTED Final   Parainfluenza Virus 1 NOT DETECTED NOT DETECTED Final   Parainfluenza Virus 2 NOT DETECTED NOT DETECTED Final   Parainfluenza Virus 3 NOT DETECTED NOT DETECTED Final   Parainfluenza Virus 4 NOT DETECTED NOT DETECTED Final   Respiratory Syncytial Virus NOT DETECTED NOT DETECTED Final   Bordetella pertussis NOT DETECTED NOT DETECTED Final   Chlamydophila pneumoniae NOT DETECTED NOT DETECTED Final   Mycoplasma pneumoniae NOT DETECTED NOT DETECTED Final    Comment: Performed at [facility], [address], [place], [place] [address]         Radiology Studies: Ct Chest W Contrast   Result Date: 09/10/2018 CLINICAL DATA:  Shortness of breath. EXAM: CT CHEST WITH CONTRAST TECHNIQUE: Multidetector CT imaging of the chest was performed during intravenous contrast administration. CONTRAST:  68mL OMNIPAQUE IOHEXOL 300 MG/ML  SOLN COMPARISON:  Chest x-ray from yesterday. CT chest dated March 22, 2015. FINDINGS: Cardiovascular: Normal heart size. No pericardial effusion. No thoracic aortic aneurysm. No central pulmonary embolism. Mediastinum/Nodes: No enlarged mediastinal, hilar, or axillary lymph nodes. Prior right hemithyroidectomy. The trachea and esophagus demonstrate no significant findings. Lungs/Pleura: No focal consolidation, pleural effusion, or pneumothorax. Postinflammatory bronchiectasis and scarring in the medial right lower lobe. Minimal bilateral lower lobe subsegmental atelectasis. No suspicious pulmonary nodule. Upper Abdomen: No acute abnormality. Slight interval increase in size of the now 1.4 cm simple cyst in the left hepatic lobe. Musculoskeletal: Acute nondisplaced fracture of the right anterolateral sixth rib. Multilevel advanced thoracic spondylosis. Chronic elevation of the right hemidiaphragm. IMPRESSION: 1.  No acute intrathoracic process. 2. Acute nondisplaced fracture of the right anterolateral sixth rib. Electronically Signed   By: Titus Dubin M.D.   On: 09/10/2018 16:39        Scheduled Meds: .  albuterol  2.5 mg Nebulization TID  . chlorhexidine  15 mL Mouth Rinse BID  . cycloSPORINE  1 drop Both Eyes BID  . diclofenac sodium  2 g Topical QID  . diltiazem  30 mg Oral Q8H  . flecainide  100 mg Oral BID  . fluticasone  1 spray Each Nare Daily  . insulin aspart  0-15 Units Subcutaneous TID WC  . insulin aspart  0-5 Units Subcutaneous QHS  . ketotifen  1 drop Both Eyes BID  . loratadine  10 mg Oral Daily  . magnesium oxide  400 mg Oral BID  . mouth rinse  15 mL Mouth Rinse q12n4p  . methylPREDNISolone (SOLU-MEDROL) injection  60 mg Intravenous  Daily  . metoprolol tartrate  25 mg Oral BID  . pantoprazole  40 mg Oral BID  . rivaroxaban  20 mg Oral Q supper  . sodium chloride flush  3 mL Intravenous Q12H  . venlafaxine XR  150 mg Oral Q breakfast   Continuous Infusions:   LOS: 2 days     Cordelia Poche, MD [facility] 09/12/2018, 12:51 PM  If 7PM-7AM, please contact night-coverage [email or web address]

## 2018-09-13 DIAGNOSIS — Z7401 Bed confinement status: Secondary | ICD-10-CM | POA: Diagnosis not present

## 2018-09-13 DIAGNOSIS — Z7901 Long term (current) use of anticoagulants: Secondary | ICD-10-CM | POA: Diagnosis not present

## 2018-09-13 DIAGNOSIS — I482 Chronic atrial fibrillation, unspecified: Secondary | ICD-10-CM | POA: Diagnosis not present

## 2018-09-13 DIAGNOSIS — F331 Major depressive disorder, recurrent, moderate: Secondary | ICD-10-CM | POA: Diagnosis not present

## 2018-09-13 DIAGNOSIS — E039 Hypothyroidism, unspecified: Secondary | ICD-10-CM | POA: Diagnosis not present

## 2018-09-13 DIAGNOSIS — G44219 Episodic tension-type headache, not intractable: Secondary | ICD-10-CM | POA: Diagnosis not present

## 2018-09-13 DIAGNOSIS — Z6841 Body Mass Index (BMI) 40.0 and over, adult: Secondary | ICD-10-CM | POA: Diagnosis not present

## 2018-09-13 DIAGNOSIS — J449 Chronic obstructive pulmonary disease, unspecified: Secondary | ICD-10-CM | POA: Diagnosis not present

## 2018-09-13 DIAGNOSIS — F419 Anxiety disorder, unspecified: Secondary | ICD-10-CM | POA: Diagnosis not present

## 2018-09-13 DIAGNOSIS — I10 Essential (primary) hypertension: Secondary | ICD-10-CM | POA: Diagnosis not present

## 2018-09-13 DIAGNOSIS — F332 Major depressive disorder, recurrent severe without psychotic features: Secondary | ICD-10-CM | POA: Diagnosis not present

## 2018-09-13 DIAGNOSIS — I480 Paroxysmal atrial fibrillation: Secondary | ICD-10-CM | POA: Diagnosis not present

## 2018-09-13 DIAGNOSIS — R52 Pain, unspecified: Secondary | ICD-10-CM | POA: Diagnosis not present

## 2018-09-13 DIAGNOSIS — J4540 Moderate persistent asthma, uncomplicated: Secondary | ICD-10-CM | POA: Diagnosis not present

## 2018-09-13 DIAGNOSIS — F064 Anxiety disorder due to known physiological condition: Secondary | ICD-10-CM | POA: Diagnosis not present

## 2018-09-13 DIAGNOSIS — J9611 Chronic respiratory failure with hypoxia: Secondary | ICD-10-CM | POA: Diagnosis not present

## 2018-09-13 DIAGNOSIS — M6281 Muscle weakness (generalized): Secondary | ICD-10-CM | POA: Diagnosis not present

## 2018-09-13 DIAGNOSIS — M545 Low back pain: Secondary | ICD-10-CM | POA: Diagnosis not present

## 2018-09-13 DIAGNOSIS — F3341 Major depressive disorder, recurrent, in partial remission: Secondary | ICD-10-CM | POA: Diagnosis not present

## 2018-09-13 DIAGNOSIS — J383 Other diseases of vocal cords: Secondary | ICD-10-CM | POA: Diagnosis not present

## 2018-09-13 DIAGNOSIS — M62838 Other muscle spasm: Secondary | ICD-10-CM | POA: Diagnosis not present

## 2018-09-13 DIAGNOSIS — F411 Generalized anxiety disorder: Secondary | ICD-10-CM | POA: Diagnosis not present

## 2018-09-13 DIAGNOSIS — R3981 Functional urinary incontinence: Secondary | ICD-10-CM | POA: Diagnosis not present

## 2018-09-13 DIAGNOSIS — R0602 Shortness of breath: Secondary | ICD-10-CM | POA: Diagnosis not present

## 2018-09-13 DIAGNOSIS — I4892 Unspecified atrial flutter: Secondary | ICD-10-CM | POA: Diagnosis not present

## 2018-09-13 DIAGNOSIS — M170 Bilateral primary osteoarthritis of knee: Secondary | ICD-10-CM | POA: Diagnosis not present

## 2018-09-13 DIAGNOSIS — R309 Painful micturition, unspecified: Secondary | ICD-10-CM | POA: Diagnosis not present

## 2018-09-13 DIAGNOSIS — I4891 Unspecified atrial fibrillation: Secondary | ICD-10-CM | POA: Diagnosis not present

## 2018-09-13 DIAGNOSIS — M2550 Pain in unspecified joint: Secondary | ICD-10-CM | POA: Diagnosis not present

## 2018-09-13 DIAGNOSIS — J9621 Acute and chronic respiratory failure with hypoxia: Secondary | ICD-10-CM | POA: Diagnosis not present

## 2018-09-13 LAB — GLUCOSE, CAPILLARY
GLUCOSE-CAPILLARY: 119 mg/dL — AB (ref 70–99)
Glucose-Capillary: 151 mg/dL — ABNORMAL HIGH (ref 70–99)
Glucose-Capillary: 186 mg/dL — ABNORMAL HIGH (ref 70–99)

## 2018-09-13 MED ORDER — PREDNISONE 10 MG PO TABS: ORAL_TABLET | ORAL | Status: DC

## 2018-09-13 MED ORDER — FAMOTIDINE 20 MG PO TABS: 20.0000 mg | ORAL_TABLET | Freq: Every day | ORAL | Status: DC

## 2018-09-13 NOTE — TOC Transition Note (Signed)
Transition of Care [facility]) - CM/SW Discharge Note   Patient Details  Name: [name] White MRN: [id number] Date of Birth: Aug 30, 1955  [facility]) CM/SW Contact:  Lia Hopping, [place] Phone Number: 09/13/2018, 12:26 PM   Clinical Narrative:    Nurse call report to: [phone] [id number] arranged to transport.    Final next level of care: Skilled Nursing Facility Barriers to Discharge: Insurance Authorization, SNF Authorization Denied(Insurance denied last admission 3/13)   Patient Goals and CMS Choice Patient states their goals for this hospitalization and ongoing recovery are:: Pt. wants to get better CMS Medicare.gov Compare Post Acute Care list provided to:: Patient Choice offered to / list presented to : Patient  Discharge Placement              Patient chooses bed at: [facility] Patient to be transferred to facility by: [place] Name of family member notified: Patient to notify family.  Patient and family notified of of transfer: 09/13/18  Discharge Plan and Services   Discharge Planning Services: CM Consult            DME Arranged: Oxygen DME Agency: [facility] HH Arranged: PT, RN [place] Agency: [place] ([facility])   Social Determinants of Health (SDOH) Interventions     Readmission Risk Interventions No flowsheet data found.

## 2018-09-13 NOTE — Discharge Summary (Signed)
Physician Discharge Summary  [name] White:[id number] DOB: 08-02-1955 DOA: 09/09/2018  PCP: [name], MD  Admit date: 09/09/2018 Discharge date: 09/13/2018  Admitted From: Home Disposition: SNF  Recommendations for Outpatient Follow-up:  1. Follow up with PCP in 1 week 2. Follow-up with pulmonology, ENT 3. Speech therapy follow-up for vocal cord dysfunction 4. Please follow up on the following pending results: None  Discharge Condition: Stable CODE STATUS: Full code Diet recommendation: Heart healthy   Brief/Interim Summary:  Admission HPI written by Vilma Prader, MD   HPI: This is a 63 year old woman with medical problems including chronic hypoxic respiratory failure with a 2 L oxygen requirement, asthma leading to disability, proxysmal atrial fibrillation on anticoagulation with Xarelto, prior pulmonary embolus in 2015, chronic back pain, and anxiety presenting with wheezing and shortness of breath.  The patient has been admitted multiple times for respiratory distress since January 2020.  The patient was admitted recently from March 4 to September 07, 2018 where she was treated for an asthma exacerbation.  Since being discharged, the patient reports persistent wheezing and shortness of breath.  She reports she does not have adequate financial resources for food and shelter.  She was staying at a motel and ordered food, but became so dyspneic walking to the door she sought medical attention.  Associated symptoms include wheezing as well as clear productive cough for about 1 month.  Also reports lightheadedness as well as right-sided pleuritic chest pain.  She denies nausea, vomiting, diarrhea.  Other symptoms reported include obesity, chronic joint pain.  She reports she has not had her Symbicort, but has been using a nebulizer as needed.  She has been taking prednisone 40 mg daily since being discharged.  She reports she has been taking her Xarelto nightly with  dinner.  No bleeding reported.  She reports she is never been intubated, became disabled in 2006 she reports on account of her asthma.  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  She reports prior to her hospitalizations she is living with her mother, believes that the best place for her to live long-term would be with her daughter who lives in [place], [place].  ED Course:  In the emergency department vital signs remarkable for increased respiratory rate up to 29, heart rate peak of 242, systolic blood pressure ranging from 100-155.  Temperature normal.  O2 saturation on 2 L nasal cannula within normal limits.  Chest x-ray revealed atelectatic change in the left lower lobe, no edema or consolidation.  CBC with white count of 11.3, neutrophil predominant, platelet count of 505.  BMP with glucose of 225.  BNP of 143.  Troponin within normal limits.  The patient was given multiple courses of nebulized bronchodilators, 125 mg of methylprednisolone, and IV magnesium.  Negative test for influenza.  Due to persistent respiratory distress, hospital medicine was consulted for further management.   Hospital course:  Vocal cord dysfunction Readmission. Initially treated as a COPD exacerbation with nebulizer treatments and solu-medrol. Appears to be back to baseline with regard to wheezing. PT recommending SNF on discharge. Discharged on previous prednisone taper. Outpatient pulmonology/ENT/speech therapy follow-up. Albuterol as needed for shortness of breath/wheezing.  Cronic respiratory failure with hypoxia Patient is chronically on 2 L via nasal canula. Continue oxygen supplementation  Chronic atrial fibrillation Rate controlled. On anticoagulation and CCC/antiarrhythmic/BB. Continue Xarelto, flecainide, diltiazem, metoprolol  Depression Continue Effexor  GERD Continue Protonix. Pepcid  History of pulmonary embolism Continue Xarelto  Post-nasal  drip Continue Flonase and  Claritin  Discharge Diagnoses:  Active Problems:   Vocal cord dysfunction   Atrial fibrillation, chronic   Shortness of breath   Reactive airway disease    Discharge Instructions  Discharge Instructions    Call MD for:  difficulty breathing, headache or visual disturbances   Complete by:  As directed    Call MD for:  persistant dizziness or light-headedness   Complete by:  As directed    Call MD for:  persistant nausea and vomiting   Complete by:  As directed    Call MD for:  temperature >100.4   Complete by:  As directed    Diet - low sodium heart healthy   Complete by:  As directed    Increase activity slowly   Complete by:  As directed    Increase activity slowly   Complete by:  As directed      Allergies as of 09/13/2018      Reactions   Caffeine Other (See Comments)   Migraine   Vicodin [hydrocodone-acetaminophen] Nausea And Vomiting, Other (See Comments)   Headache    Hydralazine Hcl    Other reaction(s): Other (See Comments) AKI leading to rhabdo and electrolyte abnormalities   Hydrocodone-acetaminophen Nausea And Vomiting   Ciprofloxacin Hives, Rash   Erythromycin Hives, Rash   Lisinopril Cough   Other reaction(s): Cough   Oxycodone Nausea And Vomiting   headache Other reaction(s): Vomiting   Sulfamethoxazole-trimethoprim Rash      Medication List    STOP taking these medications   acetaminophen 650 MG CR tablet Commonly known as:  TYLENOL Replaced by:  acetaminophen 325 MG tablet   budesonide-formoterol 160-4.5 MCG/ACT inhaler Commonly known as:  Symbicort     TAKE these medications   acetaminophen 325 MG tablet Commonly known as:  TYLENOL Take 2 tablets (650 mg total) by mouth every 6 (six) hours as needed for mild pain. Replaces:  acetaminophen 650 MG CR tablet   albuterol 108 (90 Base) MCG/ACT inhaler Commonly known as:  PROVENTIL HFA;VENTOLIN HFA Inhale 2 puffs into the lungs every 6 (six) hours as needed for wheezing or shortness of  breath.   albuterol (2.5 MG/3ML) 0.083% nebulizer solution Commonly known as:  PROVENTIL Take 3 mLs (2.5 mg total) by nebulization every 6 (six) hours as needed for wheezing or shortness of breath.   azelastine 0.05 % ophthalmic solution Commonly known as:  OPTIVAR Place 1 drop into both eyes daily.   cycloSPORINE 0.05 % ophthalmic emulsion Commonly known as:  RESTASIS Place 1 drop into both eyes 2 (two) times daily.   diclofenac sodium 1 % Gel Commonly known as:  VOLTAREN APPLY 2 GRAMS EXTERNALLY TO THE AFFECTED AREA FOUR TIMES DAILY What changed:  See the new instructions.   diltiazem 120 MG 24 hr capsule Commonly known as:  CARDIZEM CD Take 1 capsule (120 mg total) by mouth daily. Please keep upcoming appt in January before anymore refills. Final Attempt What changed:  additional instructions   famotidine 20 MG tablet Commonly known as:  Pepcid Take 1 tablet (20 mg total) by mouth daily.   flecainide 100 MG tablet Commonly known as:  TAMBOCOR Take 1 tablet (100 mg total) by mouth 2 (two) times daily. Please keep upcoming appt in January before anymore refills. Final Attempt What changed:  additional instructions   fluticasone 50 MCG/ACT nasal spray Commonly known as:  Flonase Place 1 spray into both nostrils daily.   hydrocortisone 25 MG suppository Commonly  known as:  ANUSOL-HC Place 1 suppository (25 mg total) rectally 2 (two) times daily.   hydrOXYzine 25 MG tablet Commonly known as:  ATARAX/VISTARIL Take 25 mg by mouth 2 (two) times daily as needed for anxiety.   loratadine 10 MG tablet Commonly known as:  CLARITIN Take 10 mg by mouth daily.   magnesium oxide 400 (241.3 Mg) MG tablet Commonly known as:  MAG-OX Take 1 tablet (400 mg total) by mouth 2 (two) times daily.   metoprolol tartrate 25 MG tablet Commonly known as:  LOPRESSOR Take 1 tablet (25 mg total) by mouth 2 (two) times daily.   pantoprazole 40 MG tablet Commonly known as:  PROTONIX Take 1  tablet (40 mg total) by mouth 2 (two) times daily.   potassium chloride SA 20 MEQ tablet Commonly known as:  K-DUR,KLOR-CON Take 2 tablets (40 mEq total) by mouth 2 (two) times daily.   predniSONE 10 MG tablet Commonly known as:  DELTASONE Take 4 pills for 3 days then 3 pills for 3 days then 2 pills for 3 days then 1 pill for 3 days then stop Start taking on:  September 14, 2018   rivaroxaban 20 MG Tabs tablet Commonly known as:  Xarelto TAKE 1 TABLET(20 MG) BY MOUTH DAILY AFTER SUPPER   venlafaxine XR 150 MG 24 hr capsule Commonly known as:  EFFEXOR-XR Take 150 mg by mouth daily with breakfast.   witch hazel-glycerin pad Commonly known as:  TUCKS Apply topically as needed for itching.     ASK your doctor about these medications   guaiFENesin 600 MG 12 hr tablet Commonly known as:  MUCINEX Take 2 tablets (1,200 mg total) by mouth 2 (two) times daily for 5 days. Ask about: Should I take this medication?       Contact information for follow-up providers    [name], MD Follow up.   Specialty:  Family Medicine Contact information: [address] [place] [address] [phone]            Contact information for after-discharge care    Destination    [facility] Preferred SNF .   Service:  Skilled Nursing Contact information: [address] [address] [phone]                 Allergies  Allergen Reactions   Caffeine Other (See Comments)    Migraine   Vicodin [Hydrocodone-Acetaminophen] Nausea And Vomiting and Other (See Comments)    Headache    Hydralazine Hcl     Other reaction(s): Other (See Comments) AKI leading to rhabdo and electrolyte abnormalities   Hydrocodone-Acetaminophen Nausea And Vomiting   Ciprofloxacin Hives and Rash   Erythromycin Hives and Rash   Lisinopril Cough    Other reaction(s): Cough   Oxycodone Nausea And Vomiting    headache Other reaction(s): Vomiting    Sulfamethoxazole-Trimethoprim Rash    Consultations:  None   Procedures/Studies: Dg Ribs Unilateral Right  Result Date: 08/30/2018 CLINICAL DATA:  Pt reported mid anterior right sided chest pain that started yesterday. Medical hx of asthma and HTN. EXAM: RIGHT RIBS - 2 VIEW COMPARISON:  None. FINDINGS: No displaced RIGHT rib fracture. No pneumothorax. No chest wall abnormality. IMPRESSION: No rib fracture.  No pneumothorax. Electronically Signed   By: Suzy Bouchard M.D.   On: 08/30/2018 10:28   Ct Chest W Contrast  Result Date: 09/10/2018 CLINICAL DATA:  Shortness of breath. EXAM: CT CHEST WITH CONTRAST TECHNIQUE: Multidetector CT imaging of  the chest was performed during intravenous contrast administration. CONTRAST:  45mL OMNIPAQUE IOHEXOL 300 MG/ML  SOLN COMPARISON:  Chest x-ray from yesterday. CT chest dated March 22, 2015. FINDINGS: Cardiovascular: Normal heart size. No pericardial effusion. No thoracic aortic aneurysm. No central pulmonary embolism. Mediastinum/Nodes: No enlarged mediastinal, hilar, or axillary lymph nodes. Prior right hemithyroidectomy. The trachea and esophagus demonstrate no significant findings. Lungs/Pleura: No focal consolidation, pleural effusion, or pneumothorax. Postinflammatory bronchiectasis and scarring in the medial right lower lobe. Minimal bilateral lower lobe subsegmental atelectasis. No suspicious pulmonary nodule. Upper Abdomen: No acute abnormality. Slight interval increase in size of the now 1.4 cm simple cyst in the left hepatic lobe. Musculoskeletal: Acute nondisplaced fracture of the right anterolateral sixth rib. Multilevel advanced thoracic spondylosis. Chronic elevation of the right hemidiaphragm. IMPRESSION: 1.  No acute intrathoracic process. 2. Acute nondisplaced fracture of the right anterolateral sixth rib. Electronically Signed   By: Titus Dubin M.D.   On: 09/10/2018 16:39   Dg Chest Port 1 View  Result Date: 09/09/2018 CLINICAL  DATA:  Shortness of breath EXAM: PORTABLE CHEST 1 VIEW COMPARISON:  August 29, 2018 FINDINGS: There is atelectatic change in the left lower lobe. There is no edema or consolidation. Heart is upper normal in size with pulmonary vascularity normal. No adenopathy. There is aortic atherosclerosis. No bone lesions. IMPRESSION: Left lower lobe atelectatic change. No edema or consolidation. Stable cardiac silhouette. Aortic Atherosclerosis (ICD10-I70.0). Electronically Signed   By: Lowella Grip III M.D.   On: 09/09/2018 19:32   Dg Chest Portable 1 View  Result Date: 08/29/2018 CLINICAL DATA:  Shortness of breath since 10:45 a.m. today. EXAM: PORTABLE CHEST 1 VIEW COMPARISON:  08/07/2018. FINDINGS: Poor inspiration. The most inferior portion of the left lung base is not included. The right hemidiaphragm remains mildly elevated. Mild right inferior perihilar atelectasis. Patchy airspace opacity at the left lung base. Stable borderline enlarged cardiac silhouette and tortuous and partially calcified thoracic aorta. Mild to moderate bilateral AC joint degenerative spur formation. IMPRESSION: Poor inspiration with mild right inferior perihilar atelectasis and moderate left basilar pneumonia or patchy atelectasis. Electronically Signed   By: Claudie Revering M.D.   On: 08/29/2018 14:09   Vas [place] Lower Extremity Venous (dvt)  Result Date: 09/03/2018  Lower Venous Study Indications: Edema.  Performing Technologist: Oliver Hum RVT  Examination Guidelines: A complete evaluation includes B-mode imaging, spectral Doppler, color Doppler, and power Doppler as needed of all accessible portions of each vessel. Bilateral testing is considered an integral part of a complete examination. Limited examinations for reoccurring indications may be performed as noted.  Right Venous Findings: +---------+---------------+---------+-----------+----------+-------+            Compressibility Phasicity Spontaneity Properties Summary   +---------+---------------+---------+-----------+----------+-------+  CFV       Full            Yes       Yes                             +---------+---------------+---------+-----------+----------+-------+  SFJ       Full                                                      +---------+---------------+---------+-----------+----------+-------+  FV Prox   Full                                                      +---------+---------------+---------+-----------+----------+-------+  FV Mid    Full                                                      +---------+---------------+---------+-----------+----------+-------+  FV Distal Full                                                      +---------+---------------+---------+-----------+----------+-------+  PFV       Full                                                      +---------+---------------+---------+-----------+----------+-------+  POP       Full            Yes       Yes                             +---------+---------------+---------+-----------+----------+-------+  PTV       Full                                                      +---------+---------------+---------+-----------+----------+-------+  PERO      Full                                                      +---------+---------------+---------+-----------+----------+-------+  Left Venous Findings: +---------+---------------+---------+-----------+----------+-------+            Compressibility Phasicity Spontaneity Properties Summary  +---------+---------------+---------+-----------+----------+-------+  CFV       Full            Yes       Yes                             +---------+---------------+---------+-----------+----------+-------+  SFJ       Full                                                      +---------+---------------+---------+-----------+----------+-------+  FV Prox   Full                                                       +---------+---------------+---------+-----------+----------+-------+  FV Mid    Full                                                      +---------+---------------+---------+-----------+----------+-------+  FV Distal Full                                                      +---------+---------------+---------+-----------+----------+-------+  PFV       Full                                                      +---------+---------------+---------+-----------+----------+-------+  POP       Full            Yes       Yes                             +---------+---------------+---------+-----------+----------+-------+  PTV       Full                                                      +---------+---------------+---------+-----------+----------+-------+  PERO      Full                                                      +---------+---------------+---------+-----------+----------+-------+    Summary: Right: There is no evidence of deep vein thrombosis in the lower extremity. No cystic structure found in the popliteal fossa. Left: There is no evidence of deep vein thrombosis in the lower extremity. No cystic structure found in the popliteal fossa.  *See table(s) above for measurements and observations. Electronically signed by Ruta Hinds MD on 09/03/2018 at 5:06:48 PM.    Final       Subjective: No issues this morning  Discharge Exam: Vitals:   09/12/18 2051 09/13/18 0513  BP: 121/74 (!) 156/83  Pulse: 81 68  Resp: 20 20  Temp: 97.7 F (36.5 C) 98.2 F (36.8 C)  SpO2: 99% 100%   Vitals:   09/12/18 2043 09/12/18 2051 09/13/18 0512 09/13/18 0513  BP:  121/74  (!) 156/83  Pulse:  81  68  Resp:  20  20  Temp:  97.7 F (36.5 C)  98.2 F (36.8 C)  TempSrc:  Oral  Oral  SpO2: 95% 99%  100%  Weight:   (!) 147.4 kg   Height:        General: Pt is alert, awake, not in acute distress Cardiovascular: RRR, S1/S2 +, no rubs, no gallops Respiratory: Minimal end-expiratory rhonchi Abdominal: Soft,  NT, ND, bowel sounds + Extremities: no edema, no cyanosis    The results of significant diagnostics from this hospitalization (including imaging, microbiology, ancillary and laboratory) are listed below for reference.     Microbiology: Recent Results (from the past 240 hour(s))  Respiratory Panel by PCR     Status: None   Collection Time: 09/09/18 11:24 PM  Result Value Ref Range Status   Adenovirus NOT DETECTED NOT DETECTED Final   Coronavirus 229E NOT DETECTED NOT DETECTED  Final    Comment: (NOTE) The Coronavirus on the Respiratory Panel, DOES NOT test for the novel  Coronavirus (2019 nCoV)    Coronavirus HKU1 NOT DETECTED NOT DETECTED Final   Coronavirus NL63 NOT DETECTED NOT DETECTED Final   Coronavirus OC43 NOT DETECTED NOT DETECTED Final   Metapneumovirus NOT DETECTED NOT DETECTED Final   Rhinovirus / Enterovirus NOT DETECTED NOT DETECTED Final   Influenza A NOT DETECTED NOT DETECTED Final   Influenza B NOT DETECTED NOT DETECTED Final   Parainfluenza Virus 1 NOT DETECTED NOT DETECTED Final   Parainfluenza Virus 2 NOT DETECTED NOT DETECTED Final   Parainfluenza Virus 3 NOT DETECTED NOT DETECTED Final   Parainfluenza Virus 4 NOT DETECTED NOT DETECTED Final   Respiratory Syncytial Virus NOT DETECTED NOT DETECTED Final   Bordetella pertussis NOT DETECTED NOT DETECTED Final   Chlamydophila pneumoniae NOT DETECTED NOT DETECTED Final   Mycoplasma pneumoniae NOT DETECTED NOT DETECTED Final    Comment: Performed at [facility], [place] [address] [place], [place] [address]     Labs: BNP (last 3 results) Recent Labs    07/01/18 0836 09/09/18 1850  BNP 22.4 539.7*   Basic Metabolic Panel: Recent Labs  Lab 09/09/18 1849 09/10/18 0424 09/11/18 0345  NA 140 137 137  K 3.8 3.4* 4.2  CL 105 102 103  CO2 25 24 25   GLUCOSE 225* 249* 141*  BUN 16 19 21   CREATININE 0.74 0.59 0.68  CALCIUM 8.6* 8.6* 8.8*  MG  --   --  2.6*   Liver Function Tests: No results for  input(s): AST, ALT, ALKPHOS, BILITOT, PROT, ALBUMIN in the last 168 hours. No results for input(s): LIPASE, AMYLASE in the last 168 hours. No results for input(s): AMMONIA in the last 168 hours. CBC: Recent Labs  Lab 09/09/18 1849 09/10/18 0424  WBC 11.3* 11.5*  NEUTROABS 9.1*  --   HGB 12.3 11.5*  HCT 40.2 39.2  MCV 98.3 99.7  PLT 505* 475*   Cardiac Enzymes: No results for input(s): CKTOTAL, CKMB, CKMBINDEX, TROPONINI in the last 168 hours. BNP: Invalid input(s): POCBNP CBG: Recent Labs  Lab 09/12/18 1209 09/12/18 1610 09/12/18 2207 09/13/18 0747 09/13/18 1126  GLUCAP 164* 126* 212* 119* 151*   D-Dimer No results for input(s): DDIMER in the last 72 hours. Hgb A1c No results for input(s): HGBA1C in the last 72 hours. Lipid Profile No results for input(s): CHOL, HDL, LDLCALC, TRIG, CHOLHDL, LDLDIRECT in the last 72 hours. Thyroid function studies No results for input(s): TSH, T4TOTAL, T3FREE, THYROIDAB in the last 72 hours.  Invalid input(s): FREET3 Anemia work up No results for input(s): VITAMINB12, FOLATE, FERRITIN, TIBC, IRON, RETICCTPCT in the last 72 hours. Urinalysis    Component Value Date/Time   COLORURINE Yellow 10/02/2014 [place] 01/31/2014 0745   APPEARANCEUR Clear 10/02/2014 1645   LABSPEC 1.018 10/02/2014 1645   PHURINE 6.0 10/02/2014 1645   PHURINE 6.5 01/31/2014 0745   GLUCOSEU Negative 10/02/2014 1645   HGBUR Negative 10/02/2014 1645   HGBUR TRACE (A) 01/31/2014 0745   BILIRUBINUR Negative 10/02/2014 1645   KETONESUR Negative 10/02/2014 1645   KETONESUR 15 (A) 01/31/2014 0745   PROTEINUR Negative 10/02/2014 1645   PROTEINUR NEGATIVE 01/31/2014 0745   UROBILINOGEN 1.0 01/31/2014 0745   NITRITE Negative 10/02/2014 1645   NITRITE NEGATIVE 01/31/2014 0745   LEUKOCYTESUR Negative 10/02/2014 1645   Sepsis Labs Invalid input(s): PROCALCITONIN,  WBC,  LACTICIDVEN Microbiology Recent Results (from the past  240 hour(s))    Respiratory Panel by PCR     Status: None   Collection Time: 09/09/18 11:24 PM  Result Value Ref Range Status   Adenovirus NOT DETECTED NOT DETECTED Final   Coronavirus 229E NOT DETECTED NOT DETECTED Final    Comment: (NOTE) The Coronavirus on the Respiratory Panel, DOES NOT test for the novel  Coronavirus (2019 nCoV)    Coronavirus HKU1 NOT DETECTED NOT DETECTED Final   Coronavirus NL63 NOT DETECTED NOT DETECTED Final   Coronavirus OC43 NOT DETECTED NOT DETECTED Final   Metapneumovirus NOT DETECTED NOT DETECTED Final   Rhinovirus / Enterovirus NOT DETECTED NOT DETECTED Final   Influenza A NOT DETECTED NOT DETECTED Final   Influenza B NOT DETECTED NOT DETECTED Final   Parainfluenza Virus 1 NOT DETECTED NOT DETECTED Final   Parainfluenza Virus 2 NOT DETECTED NOT DETECTED Final   Parainfluenza Virus 3 NOT DETECTED NOT DETECTED Final   Parainfluenza Virus 4 NOT DETECTED NOT DETECTED Final   Respiratory Syncytial Virus NOT DETECTED NOT DETECTED Final   Bordetella pertussis NOT DETECTED NOT DETECTED Final   Chlamydophila pneumoniae NOT DETECTED NOT DETECTED Final   Mycoplasma pneumoniae NOT DETECTED NOT DETECTED Final    Comment: Performed at [facility], [address], [place], [place] [address]    SIGNED:   Cordelia Poche, MD [facility] 09/13/2018, 11:39 AM

## 2018-09-13 NOTE — Discharge Instructions (Signed)
[facility]  You were in the hospital with shortness of breath. You have vocal cord dysfunction which is playing a part in your symptoms. Your symptoms have resolved. Please follow-up with your pulmonologist, an ENT physician and speech therapy.

## 2018-09-13 NOTE — Care Management Important Message (Signed)
Important Message  Patient Details  Name: GABBY RACKERS MRN: [id number] Date of Birth: November 27, 1955   Medicare Important Message Given:  Yes    Kerin Salen 09/13/2018, 11:05 AMImportant Message  Patient Details  Name: MAURISHA MONGEAU MRN: [id number] Date of Birth: 1956-06-25   Medicare Important Message Given:  Yes    Kerin Salen 09/13/2018, 11:05 AM

## 2018-09-13 NOTE — Progress Notes (Signed)
  Speech Language Pathology Treatment: Dysphagia  Patient Details Name: [name] White MRN: [id number] DOB: 1956-04-11 Today's Date: 09/13/2018 Time: 1450-1535 SLP Time Calculation (min) (ACUTE ONLY): 45 min  Assessment / Plan / Recommendation Clinical Impression  Pt session focused on education regarding her tolerance of po diet, swallowing/respiration reciprocity and reflux precautions. Pt denied having dysphagia stating "I don't have problems swallowing".    Reflux Symptom Index provided to pt with her scoring 35/45 highly indicated of laryngopharyngeal reflux.  Advised her that LPR can be a trigger for her VCD.  Reviewed VCD techniques including diaphragmatic breathing with pt needing mod I.   Provided pt with written instructions re: reflux precautions.  Advised she retest herself monthly to determine improvement in LPR/reflux.    All education completed at this time.  Do not recommend pt work on RMST (respiratory muscle strength training) at this time as concern this may increase pressure/tension at larynx.  Advised pt to focus on strategies for VCD.  Thanks for allowing me to help with this pt's care plan.     HPI HPI: 63 yo female recently dc'd from hospital with COPD exacerbation now readmitted.  PMH + for diagnosis of vocal cord dysfunction, COPD, GERD, smoke exposure, .  SLP referral received for VCD during last admit and now for swallow evaluation.        SLP Plan  All goals met       Recommendations  Diet recommendations: Regular;Thin liquid Liquids provided via: Cup;Straw Medication Administration: Whole meds with liquid Supervision: Patient able to self feed Compensations: Minimize environmental distractions;Slow rate;Small sips/bites Postural Changes and/or Swallow Maneuvers: Seated upright 90 degrees;Upright 30-60 min after meal                Oral Care Recommendations: Oral care BID Follow up Recommendations: Skilled Nursing facility(for VCD per MD note) SLP  Visit Diagnosis: Dysphagia, unspecified (R13.10) Plan: All goals met       GO                [name] White 09/13/2018, 7:45 PM   [name] White, [facility] SLP [place] Pager [phone] Office [phone]

## 2018-09-14 ENCOUNTER — Other Ambulatory Visit: Payer: Self-pay | Admitting: *Deleted

## 2018-09-14 NOTE — Patient Outreach (Signed)
Message received from [place], [place] reporting pt will not be returning home, she will transferring to skilled nursing facility.  RN CM closed case.  Jacqlyn Larsen [facility], [place] Coordinator [phone]

## 2018-09-20 DIAGNOSIS — I10 Essential (primary) hypertension: Secondary | ICD-10-CM | POA: Diagnosis not present

## 2018-09-20 DIAGNOSIS — J9621 Acute and chronic respiratory failure with hypoxia: Secondary | ICD-10-CM | POA: Diagnosis not present

## 2018-09-20 DIAGNOSIS — J383 Other diseases of vocal cords: Secondary | ICD-10-CM | POA: Diagnosis not present

## 2018-09-20 DIAGNOSIS — I480 Paroxysmal atrial fibrillation: Secondary | ICD-10-CM | POA: Diagnosis not present

## 2018-09-20 DIAGNOSIS — J4540 Moderate persistent asthma, uncomplicated: Secondary | ICD-10-CM | POA: Diagnosis not present

## 2018-09-25 DIAGNOSIS — M545 Low back pain: Secondary | ICD-10-CM | POA: Diagnosis not present

## 2018-09-25 DIAGNOSIS — M6281 Muscle weakness (generalized): Secondary | ICD-10-CM | POA: Diagnosis not present

## 2018-09-25 DIAGNOSIS — M62838 Other muscle spasm: Secondary | ICD-10-CM | POA: Diagnosis not present

## 2018-09-25 DIAGNOSIS — I10 Essential (primary) hypertension: Secondary | ICD-10-CM | POA: Diagnosis not present

## 2018-10-12 ENCOUNTER — Telehealth: Payer: Self-pay | Admitting: Emergency Medicine

## 2018-10-12 DIAGNOSIS — F419 Anxiety disorder, unspecified: Secondary | ICD-10-CM | POA: Diagnosis not present

## 2018-10-12 DIAGNOSIS — J383 Other diseases of vocal cords: Secondary | ICD-10-CM | POA: Diagnosis not present

## 2018-10-12 DIAGNOSIS — M545 Low back pain: Secondary | ICD-10-CM | POA: Diagnosis not present

## 2018-10-12 DIAGNOSIS — M62838 Other muscle spasm: Secondary | ICD-10-CM | POA: Diagnosis not present

## 2018-10-12 NOTE — Telephone Encounter (Signed)
Dr. Lamonte Sakai is in the hospital until July due to [place] so the [place] whether it is a face-to-face OV in office or televisit would have to be with an APP.  Pt was admitted to hospital 08/07/2018 for a total of 13 days due to COPD exacerbation, on 08/29/2018 for a total of 9 days due to COPD exacerbation, and also 09/09/2018 for a total of 4 days due to COPD exacerbation.  Attempted to call pt but unable to reach. Left message for pt to return call.

## 2018-10-15 ENCOUNTER — Telehealth: Payer: Self-pay | Admitting: Emergency Medicine

## 2018-10-15 NOTE — Telephone Encounter (Signed)
Attempted to call pt but unable to reach. Left message for pt to return call. 

## 2018-10-15 NOTE — Telephone Encounter (Signed)
ATC pt, went to voicemail. LMTCB.  

## 2018-10-16 ENCOUNTER — Other Ambulatory Visit: Payer: Self-pay

## 2018-10-16 ENCOUNTER — Telehealth: Payer: Self-pay | Admitting: Emergency Medicine

## 2018-10-16 ENCOUNTER — Encounter: Payer: Self-pay | Admitting: Primary Care

## 2018-10-16 ENCOUNTER — Ambulatory Visit (INDEPENDENT_AMBULATORY_CARE_PROVIDER_SITE_OTHER): Payer: Medicare HMO | Admitting: Primary Care

## 2018-10-16 DIAGNOSIS — G44219 Episodic tension-type headache, not intractable: Secondary | ICD-10-CM | POA: Diagnosis not present

## 2018-10-16 DIAGNOSIS — J383 Other diseases of vocal cords: Secondary | ICD-10-CM

## 2018-10-16 DIAGNOSIS — M62838 Other muscle spasm: Secondary | ICD-10-CM | POA: Diagnosis not present

## 2018-10-16 DIAGNOSIS — R52 Pain, unspecified: Secondary | ICD-10-CM | POA: Diagnosis not present

## 2018-10-16 DIAGNOSIS — M545 Low back pain: Secondary | ICD-10-CM | POA: Diagnosis not present

## 2018-10-16 NOTE — Telephone Encounter (Signed)
Please refer to encounter from 4/17 as this is a duplicate to that encounter.

## 2018-10-16 NOTE — Telephone Encounter (Signed)
LVM for the patient to let her know we were trying to reach her to set up hospital visit over the phone if possible.  Requested the patient call back to set up televisit and to provide a day and time that will work for her to be scheduled.

## 2018-10-16 NOTE — Patient Instructions (Addendum)
VCD: - Referral speech therapy - Already stopped Symbicort - Continue Albuterol 2 puffs every 4-6 hours prn sob/wheezing - Continue GERD and PND medications  - Follow up with ENT  Suspected sleep apnea: - Needs sleep study   Follow Up Instructions:   - Follow up with Dr. Lamonte Sakai around July/August or when return from [facility]    Paradoxical Vocal Fold Motion  Paradoxical vocal fold motion is a condition that causes shortness of breath and noisy breathing (stridor). It may also be called vocal cord dysfunction. Normally, the vocal cords (vocal folds) inside the voice box (larynx) open when you breathe in and out. If you have paradoxical vocal fold motion, your vocal cords close when you try to breathe. This makes breathing difficult. This condition can be scary, but it is usually not dangerous. It can be confused with asthma because the symptoms are similar. What are the causes? Paradoxical vocal fold motion is caused by overreaction (hyperexcitability) of the nerves that provide movement and feeling in the vocal cords. The cause of hyperexcitability is not known, but sometimes triggers can be identified. Common triggers of vocal cord hyperexcitability include:  Stomach acid flowing up into your throat (gastric reflux).  Mucus going down the back of your throat (postnasal drip).  Infection in the nose, mouth, throat, or larynx (upper respiratory tract infection).  Exercise.  Strong smells.  Cigarette smoke or other irritating substances in the air.  Stress.  Allergies. What are the signs or symptoms? Symptoms of this condition include:  Shortness of breath. This may happen at rest or during exercise.  Frequent coughing and clearing the throat.  A feeling of choking or tightness in the throat.  Stridor or wheezing.  Hoarse voice. Symptoms may come and go. They do not occur during sleep. They may range from mild to severe. How is this diagnosed? This condition can be hard  to diagnose because it comes and goes and is similar to asthma. Your health care provider may suspect paradoxical vocal fold motion if you do not have symptoms while you sleep or if asthma medicines do not relieve your symptoms. Your health care provider may:  Have you work with (refer you to) an ears, nose, and throat specialist (otolaryngologist, ENT) or a speech and language specialist (speech-language pathologist) who will examine your vocal cords to see if they close when you breathe. This examination is usually done by inserting a thin, flexible tube with a light and camera on the end of it through your nose to look down into your larynx (laryngoscopy). This is the most reliable way to diagnose the condition.  Refer you to a breathing specialist (pulmonologist) who will do breathing tests (spirometry or pulmonary function tests, PFTs) to check for a breathing pattern that is typical for this condition.  Do a physical exam.  Ask about your symptoms and what seems to cause them (your triggers).  Listen to your breathing.  Test your blood for: ? Oxygen levels. ? Allergies.  Do a chest X-ray. How is this treated? This condition is treated with speech or voice therapy, which includes breathing and relaxation exercises to help you learn how to relax and control your vocal cords. After you learn those exercises, you can practice them at home and use them to stop an attack. Talk therapy (psychotherapy) with a psychologist can help you learn ways to reduce stress and anxiety and avoid triggers. In some cases, your health care provider may recommend medicines to treat an underlying condition  that can trigger paradoxical vocal fold motion. These may include medicines for:  Postnasal drip.  Gastric reflux.  Upper respiratory tract infection.  Allergies.  Stress. Follow these instructions at home:  Take over-the-counter and prescription medicines only as told by your health care  provider.  Follow instructions from your speech or voice therapist about practicing your vocal cord relaxation and breathing exercises at home.  Follow instructions from your psychologist about how to lower your stress and anxiety.  Return to your normal activities as told by your health care provider. Ask your health care provider what activities are safe for you.  Avoid triggers.  Do not use any products that contain nicotine or tobacco, such as cigarettes and e-cigarettes. If you need help quitting, ask your health care provider. Cigarette smoke may trigger this condition.  Keep all follow-up visits as told by your health care providers, including therapists. This is important. Contact a health care provider if:  Your symptoms do not get better with treatment and home care. Get help right away if:  You have chest pain.  You have trouble breathing that is not relieved by doing exercises and following instructions from your health care providers. Summary  Paradoxical vocal fold motion is a condition that causes your vocal cords (vocal folds) to close when you try to breathe.  This condition is often triggered by stress or irritation of your vocal cords.  Symptoms include sudden shortness of breath and noisy breathing (stridor).  Doing a vocal cord exam (laryngoscopy) is the most reliable way to diagnose this condition.  This condition is treated with speech or voice therapy and talk therapy (psychotherapy). This information is not intended to replace advice given to you by your health care provider. Make sure you discuss any questions you have with your health care provider. Document Released: 02/08/2017 Document Revised: 02/08/2017 Document Reviewed: 02/08/2017 Elsevier Interactive Patient Education  [employer].

## 2018-10-16 NOTE — Telephone Encounter (Signed)
Attempted to call pt but unable to reach. Left message for pt to return call. 

## 2018-10-16 NOTE — Progress Notes (Signed)
Virtual Visit via Telephone Note  I connected with [name] White on 10/16/18 at  4:00 PM EDT by telephone and verified that I am speaking with the correct person using two identifiers.   I discussed the limitations, risks, security and privacy concerns of performing an evaluation and management service by telephone and the availability of in person appointments. I also discussed with the patient that there may be a patient responsible charge related to this service. The patient expressed understanding and agreed to proceed.   History of Present Illness: 63 year old female, former smoker quit in 2001. PMH significant for asthma, acute on chronic respiratory failure with hypoxia, allergic rhinitis, reactive airway disease, sleep apnea, GERD, afib (on anticoagulation), HTN. Patient of Dr. Lamonte Sakai, last seen 07/18/18.   She has had two hospitalizations in March for asthma exacerbation. She was discharge to rehab, currently at [facility] care center. Reports that she is getting stronger. Receiving OT and PT 6 days a week. Possible discharge next week. Completed prednisone taper. Continues protonix, famotidine, claritin and nasal spray. States that her breathing has been pretty good. On 2L oxygen 95-99%. She has only used albuterol nebulizer twice since discharge. Symbicort was discontinued. Eating and drinking ok. States when she drinks liquids it feels like it goes down the wrong way sometimes. Denies acid reflux/heartburn symptoms. Denies fever. Still needs sleep study.    Observations/Objective:  - No shortness of breath, wheezing or cough observed over the phone - Moderate Voice hoarseness   Assessment and Plan:  VCD: - Referral speech therapy - Already stopped Symbicort - Continue Albuterol prn - Max GERD and PND treatment - Follow up with ENT  Suspected sleep apnea: - Needs sleep study    Follow Up Instructions:   - Follow up with Dr. Lamonte Sakai around [place] or when return  from [place]   I discussed the assessment and treatment plan with the patient. The patient was provided an opportunity to ask questions and all were answered. The patient agreed with the plan and demonstrated an understanding of the instructions.   The patient was advised to call back or seek an in-person evaluation if the symptoms worsen or if the condition fails to improve as anticipated.  I provided 18 minutes of non-face-to-face time during this encounter.   Martyn Ehrich, NP

## 2018-10-17 ENCOUNTER — Telehealth: Payer: Self-pay | Admitting: Primary Care

## 2018-10-17 ENCOUNTER — Other Ambulatory Visit: Payer: Self-pay | Admitting: Family Medicine

## 2018-10-17 NOTE — Telephone Encounter (Signed)
OV notes from yesterday's virtual visit has been faxed to Tammy at provided fax number. Nothing further needed.

## 2018-10-17 NOTE — Telephone Encounter (Signed)
Patient had an televisit with Beth yesterday. Will close this encounter.

## 2018-10-19 DIAGNOSIS — F064 Anxiety disorder due to known physiological condition: Secondary | ICD-10-CM | POA: Diagnosis not present

## 2018-10-19 DIAGNOSIS — F331 Major depressive disorder, recurrent, moderate: Secondary | ICD-10-CM | POA: Diagnosis not present

## 2018-10-26 DIAGNOSIS — R309 Painful micturition, unspecified: Secondary | ICD-10-CM | POA: Diagnosis not present

## 2018-10-26 DIAGNOSIS — M6281 Muscle weakness (generalized): Secondary | ICD-10-CM | POA: Diagnosis not present

## 2018-10-26 DIAGNOSIS — F419 Anxiety disorder, unspecified: Secondary | ICD-10-CM | POA: Diagnosis not present

## 2018-10-26 DIAGNOSIS — R3981 Functional urinary incontinence: Secondary | ICD-10-CM | POA: Diagnosis not present

## 2018-10-29 DIAGNOSIS — M6281 Muscle weakness (generalized): Secondary | ICD-10-CM | POA: Diagnosis not present

## 2018-10-29 DIAGNOSIS — I482 Chronic atrial fibrillation, unspecified: Secondary | ICD-10-CM | POA: Diagnosis not present

## 2018-10-29 DIAGNOSIS — F419 Anxiety disorder, unspecified: Secondary | ICD-10-CM | POA: Diagnosis not present

## 2018-10-29 DIAGNOSIS — F332 Major depressive disorder, recurrent severe without psychotic features: Secondary | ICD-10-CM | POA: Diagnosis not present

## 2018-10-29 DIAGNOSIS — J449 Chronic obstructive pulmonary disease, unspecified: Secondary | ICD-10-CM | POA: Diagnosis not present

## 2018-10-29 DIAGNOSIS — I10 Essential (primary) hypertension: Secondary | ICD-10-CM | POA: Diagnosis not present

## 2018-10-29 DIAGNOSIS — E039 Hypothyroidism, unspecified: Secondary | ICD-10-CM | POA: Diagnosis not present

## 2018-10-30 ENCOUNTER — Other Ambulatory Visit: Payer: Self-pay

## 2018-10-30 ENCOUNTER — Telehealth (INDEPENDENT_AMBULATORY_CARE_PROVIDER_SITE_OTHER): Payer: Medicare HMO | Admitting: Family Medicine

## 2018-10-30 DIAGNOSIS — Z20828 Contact with and (suspected) exposure to other viral communicable diseases: Secondary | ICD-10-CM | POA: Diagnosis not present

## 2018-10-30 DIAGNOSIS — Z20822 Contact with and (suspected) exposure to covid-19: Secondary | ICD-10-CM

## 2018-10-31 ENCOUNTER — Other Ambulatory Visit: Payer: Self-pay | Admitting: *Deleted

## 2018-10-31 NOTE — Patient Outreach (Addendum)
[facility]) Care Management  10/31/2018  [name] White 08-11-1955 [id number]   Transition of Care Referral from snf   Referral Date:  10/31/18 Referral Source:  [facility] inpatient referral  Date of Admission: to [facility] on 09/13/18  Diagnosis: vocal cord dysfunctiion, chronic respiratory failure with hypoxia, chronic afib, depression  Date of Discharge: Facility: [facility] on 10/29/18 Insurance: [facility] medicare admissions x 4 in the last 6 months  Last admission Admit date: 09/09/2018 Discharge date: 09/13/2018 for   Outreach attempt # 1 to listed home and mobile number  No answer. [facility] RN CM left HIPAA compliant voicemail message along with CM's contact info.   Plan: [facility] RN CM sent an unsuccessful outreach letter and scheduled this patient for another call attempt within 4 business days  Conditions: chronic hypoxic respiratory failure with a 2 L oxygen requirement, asthma leading to disability, proxysmal atrial fibrillation on anticoagulation with Xarelto, prior pulmonary embolus in 2015, chronic back pain, GERD, posta nasal drip, depression, vocal cord dysfunction, anxiety   Kimberly L. Lavina Hamman, RN, BSN, [place] Management Care Coordinator Direct Number [phone] Mobile number [phone]  Main [facility] number [phone] Fax number [phone]

## 2018-11-01 ENCOUNTER — Other Ambulatory Visit: Payer: Self-pay | Admitting: *Deleted

## 2018-11-01 NOTE — Patient Outreach (Signed)
[facility]) Care Management  11/01/2018  [name] White 09-23-1955 [id number]   Transition of Care Referral from snf  Referral Date:  10/31/18 Referral Source:  [facility] inpatient referral  Date of Admission: to [place] [facility] on 09/13/18  Diagnosis: vocal cord dysfunctiion, chronic respiratory failure with hypoxia, chronic afib, depression  Date of Discharge: Facility: [facility] on 10/29/18 Insurance: [place] admissions x 4 in the last 6 months  Last admission Admit date:09/09/2018 Discharge date:09/13/2018 for   Outreach attempt # 2 to listed home and mobile number [phone] and to another listed mobele as [phone] No answer. [facility] RN CM left HIPAA compliant voicemail message along with CM's contact info.   Plan: [facility] RN CM sent an unsuccessful outreach letter on 10/31/18, left  Messages on 10/31/18 and 11/01/18  [facility] RN CM scheduled this patient for a final call attempt within 4 business days  Conditions: chronic hypoxic respiratory failure with a 2 L oxygen requirement, asthma leading to disability, proxysmal atrial fibrillation on anticoagulation with Xarelto, prior pulmonary embolus in 2015, chronic back pain, GERD, posta nasal drip, depression, vocal cord dysfunction, anxiety   Kimberly L. Lavina Hamman, RN, BSN, [place] Management Care Coordinator Direct Number [phone] Mobile number [phone]  Main [facility] number [phone] Fax number [phone]

## 2018-11-02 DIAGNOSIS — Z20828 Contact with and (suspected) exposure to other viral communicable diseases: Secondary | ICD-10-CM | POA: Insufficient documentation

## 2018-11-02 DIAGNOSIS — Z20822 Contact with and (suspected) exposure to covid-19: Secondary | ICD-10-CM | POA: Insufficient documentation

## 2018-11-02 HISTORY — DX: Contact with and (suspected) exposure to covid-19: Z20.822

## 2018-11-02 NOTE — Assessment & Plan Note (Signed)
Consider resolved.  Patient exposed over 1 month ago with no symptoms.  She has long expired that to be quarantined.  Explained the testing not indicated, patient in agreement and understands.

## 2018-11-02 NOTE — Progress Notes (Signed)
[place] Telemedicine Visit  Patient consented to have virtual visit. Method of visit: Telephone  Encounter participants: Patient: [name] White - located at Home Provider: [name] - located at [facility] Others (if applicable): na  Chief Complaint: COVID testing  HPI: 63 year old African-American female who is interested in a COVID test.  In brief patient was admitted to [facility] on March 15/2020 for COPD exacerbation.  She was discharged to skilled nursing facility.  She was exposed to worker who eventually tested, positive on April 3.  To the best of her knowledge no other residents or staff at the ALF tested positive.  She has had no respiratory symptoms, no fever, and no other symptoms that are ordinary symptoms.  Explained that given her lack of symptoms and the fact that she has gone over a month since the exposure there is no testing indicated.   ROS: per HPI  Pertinent PMHx: Hospitalized on 09/09/2018 with COPD exacerbation  Exam:  General: No acute distress, very pleasant Respiratory: Nonlabored respirations, no audible wheezing appreciated Psych:.  Appropriate, very pleasant  Assessment/Plan:  Exposure to Covid-19 Virus Consider resolved.  Patient exposed over 1 month ago with no symptoms.  She has long expired that to be quarantined.  Explained the testing not indicated, patient in agreement and understands.    Time spent during visit with patient: 6 minutes

## 2018-11-05 ENCOUNTER — Other Ambulatory Visit: Payer: Self-pay | Admitting: *Deleted

## 2018-11-05 NOTE — Patient Outreach (Signed)
[facility]) Care Management  11/05/2018  SHAILYN WEYANDT 09-30-1955 [id number]   Care coordination  [facility] RN CM received a voice message on 11/05/18  in CM's 11/02/18 absence from pt Pt left message on 11/02/18 1446 confirming her d/c from [facility] to her home but reporting she was on self quarantine after a positive COVID 19 test. She apologize for not returning CM call earlier and reported she has reached out to a [place] clinic in the [place] system for testing and results. She stated something about doing something after testing the CM did not understand [facility] RN CM was not able to get clarity in her message if she needed assist with getting results or further questions   Final Outreach call attempt #3 [facility] RN returned a call to her and received her voice mail box. CM left a HIPAA compliant message acknowledging that [facility] RN CM received her message and CM requested a return call from her when she at her convenience to get further clarity of her message or needs.  Plan [facility] RN CM left 3 voice messages (10/31/18, 11/01/18 and 11/05/18) for this patient to attempt to complete transition of care services after her discharge from [place]. [facility] RN CM sent an unsuccessful outreach letter on 10/31/18 to Ms Colgate. [facility] RN CM will pend case for case closure within 7 business days if no return call from Ms Koos   Routed note to MD                         [place]. Lavina Hamman, RN, BSN, [place] Coordinator Office number [phone] Mobile number [phone]  Main [facility] number [phone] Fax number [phone]

## 2018-11-06 ENCOUNTER — Telehealth: Payer: Self-pay | Admitting: Family Medicine

## 2018-11-06 ENCOUNTER — Telehealth (INDEPENDENT_AMBULATORY_CARE_PROVIDER_SITE_OTHER): Payer: Medicare HMO | Admitting: Family Medicine

## 2018-11-06 ENCOUNTER — Telehealth: Payer: Self-pay

## 2018-11-06 DIAGNOSIS — Z20828 Contact with and (suspected) exposure to other viral communicable diseases: Secondary | ICD-10-CM

## 2018-11-06 DIAGNOSIS — Z20822 Contact with and (suspected) exposure to covid-19: Secondary | ICD-10-CM

## 2018-11-06 NOTE — Progress Notes (Signed)
[facility] Telemedicine Visit  Patient consented to have virtual visit. Method of visit: Video  Encounter participants: Patient: [name] White - located at [place] on isolation [facility] for Rehab staff tested positive while she was at [facility]) Provider: Andrena Mews - located at Office Others (if applicable): NA  Chief Complaint: COVID-19 exposure  HPI:  She was at [facility] for five weeks for pulmonary rehab and was later informed that one of their staff tested positive while she was at [facility]. That was sometimes on April 3rd. Now she has a cough, and she is congested with some wheezing. She uses her albuterol as needed. Denies any fever. Denies any SOB, but she uses 2 L O2 at baseline. She has been coughing for two days. She denies sputum production. However, she hears a rattling in her chest. Denies chest pain.   ROS: per HPI  Pertinent PMHx: Problem list reviewed  Exam:  Respiratory: No distress  Assessment/Plan:  Exposure to Covid-19 Virus The patient is currently asymptomatic. She will like to get tested. She has exposure hx of comorbid conditions that increase her mortality rate. I feel it is reasonable to get her tested now that she is symptomatic. I called the COVID-19 testing center at [phone] to schedule an appointment. I was placed on hold for about 20 min. Melody Crawford later informed me that the center just opened today, and they are backlog. Hence she was not able to patch me through to the nurse. She collected the patient's phone number and will plan to call the patient back to schedule her appointment. I passed this information across to the patient. She is aware of going to the ED if her symptoms worsen.    Time spent during visit with patient: 40 minutes  More than 50% of this video visit encounter was spent on coordination of care and counseling in addition to hx taking.

## 2018-11-06 NOTE — Assessment & Plan Note (Addendum)
The patient is currently asymptomatic. She will like to get tested. She has exposure hx of comorbid conditions that increase her mortality rate. I feel it is reasonable to get her tested now that she is symptomatic. I called the COVID-19 testing center at [phone] to schedule an appointment. I was placed on hold for about 20 min. Melody Crawford later informed me that the center just opened today, and they are backlog. Hence she was not able to patch me through to the nurse. She collected the patient's phone number and will plan to call the patient back to schedule her appointment. I passed this information across to the patient. She is aware of going to the ED if her symptoms worsen.

## 2018-11-06 NOTE — Telephone Encounter (Signed)
Received call from Dr. Dorise Bullion office for COVID 19 testing.

## 2018-11-07 DIAGNOSIS — F331 Major depressive disorder, recurrent, moderate: Secondary | ICD-10-CM | POA: Diagnosis not present

## 2018-11-07 DIAGNOSIS — J9611 Chronic respiratory failure with hypoxia: Secondary | ICD-10-CM | POA: Diagnosis not present

## 2018-11-07 NOTE — Telephone Encounter (Signed)
Appointment made for testing.

## 2018-11-08 ENCOUNTER — Other Ambulatory Visit: Payer: Medicare HMO

## 2018-11-08 NOTE — Telephone Encounter (Signed)
Patient called and asked to reschedule her appointment to tomorrow afternoon due to she has a migraine today, she says tomorrow afternoon because she's disabled. Appointment rescheduled for tomorrow, 11/09/18 at 1545.

## 2018-11-09 ENCOUNTER — Telehealth: Payer: Self-pay | Admitting: *Deleted

## 2018-11-09 ENCOUNTER — Other Ambulatory Visit: Payer: Medicare HMO

## 2018-11-09 NOTE — Telephone Encounter (Signed)
Pt wants to reschedule her COVID-19 test from today at 3:45 PM.   "My breathing is not good today". I changed her appt to 3:45 on Nov 13, 2018 (Tuesday). The late afternoon is the only time she can come due to being disabled and her transportation.  The lab order is still in the chart and active.

## 2018-11-12 ENCOUNTER — Telehealth: Payer: Self-pay | Admitting: Family Medicine

## 2018-11-12 NOTE — Telephone Encounter (Signed)
I called all number provided and LVM for patient to call us.

## 2018-11-12 NOTE — Telephone Encounter (Signed)
Need RN follow-up call for COVID-19 exposure.  Please check on her respiratory status as counsel appropriately. Thanks.

## 2018-11-13 ENCOUNTER — Telehealth: Payer: Self-pay | Admitting: *Deleted

## 2018-11-13 ENCOUNTER — Other Ambulatory Visit: Payer: Medicare HMO

## 2018-11-13 DIAGNOSIS — J9611 Chronic respiratory failure with hypoxia: Secondary | ICD-10-CM | POA: Diagnosis not present

## 2018-11-13 NOTE — Telephone Encounter (Signed)
lmomv again for callback.  Christen Bame, CMA

## 2018-11-13 NOTE — Telephone Encounter (Signed)
Pt called to cancel her appointment for testing today because her ride fell thru; she would like to come after 1300 on Thursday 11/15/2018; pt offered and accepted appointment at [facility] location 11/15/2018 at 1330; pt given address, directions, and instructions that she and any occupants of the vehicle should wear a mask; order previously placed.

## 2018-11-14 ENCOUNTER — Other Ambulatory Visit: Payer: Self-pay

## 2018-11-14 ENCOUNTER — Other Ambulatory Visit: Payer: Self-pay | Admitting: *Deleted

## 2018-11-14 NOTE — Patient Outreach (Signed)
[facility]) Care Management  11/14/2018  [name] White 10-Mar-1956 [id number]   Transition of Care Referral from snf  Referral Date:  10/31/18 Referral Source:  [facility]: Lambert Kura W inpatient referral  Date of Admission: to [place] [facility] care on 09/13/18  Diagnosis: vocal cord dysfunctiion, chronic respiratory failure with hypoxia, chronic afib, depression  Date of Discharge: Facility: [facility] on 10/29/18 Insurance: [facility] medicare admissions x 4 in the last 6 months  Last admission Admit date:09/09/2018 Discharge date:09/13/2018 for  vocal cord dysfunctiion, chronic respiratory failure with hypoxia, chronic afib, depression    [name] left [facility] RN CM a voice message on 11/13/18 after 1215 requesting a return call   Outreach attempt # 4 No answer. [facility] RN CM left HIPAA compliant voicemail message along with CM's contact info.    Conditions:chronic hypoxic respiratory failure with a 2 L oxygen requirement, asthma leading to disability, proxysmal atrial fibrillation on anticoagulation with Xarelto, prior pulmonary embolus in 2015, chronic back pain,GERD, posta nasal drip, depression, vocal cord dysfunction,anxiety   Plan: [facility] RN CM left 4 voice messages (10/31/18, 11/01/18, 11/05/18 and 11/14/18) for this patient to attempt to complete transition of care services after her discharge from [place]. [facility] RN CM sent an unsuccessful outreach letter on 10/31/18 to [name]. [facility] RN CM will pend case for case closure within 2 business days if no return call from [name]   Routed note to MD

## 2018-11-14 NOTE — Patient Outreach (Signed)
[name]) White Management  11/14/2018  [name] White July 30, 1955 [id number]   Transition of White [facility] snf  Referral Date:10/31/18 Referral Source:[name] White inpatient referral  Date of Admission:to [name] on 09/13/18 Diagnosis:vocal cord dysfunction, chronic respiratory failure with hypoxia, chronic Afib, depression Date of Discharge: [place] on 10/29/18 Insurance:[name] White medicare changed from PPO to HMO on 06/27/2018 and She confirms 11/14/18 she has medicaid effective March 2020  admissions x 4 in thelast 6 months Last admissionAdmit date:09/09/2018 Discharge date:3/19/2020forvocal cord dysfunction, chronic respiratory failure with hypoxia, chronic Afib, depression   Patient returned a [name] White Patient is able to verify HIPAA Reviewed and addressed transition of White referral to [name] with patient  [name] White inquired about transportation, DME needs and Rx delivery when White completed transition of White assessment  [facility] RN White completed conference calls to [name] White, [name] White, [name] White customer services [phone]), [name] White [phone])  [name] White at [name] White assisted in getting a new [name] White HMO card sent to [name] White staff confirm she does have transportation services but is unable to tell [name] White how many rides in a year at this time.  Attempts to reach her primary MD office, [name] White [phone]) to discuss DME needs was unsuccessful    Social: [name] White confirms she was d/c from [place] [facility] on 10/29/18 after a month more of rehab services, She reports being informed that she had been in contact with a COVID 19 positive [name] healthcare snf staff. She was then recommended to be quarantined at a local hotel, [name] White (room # [name] White) for 14 days and then until her COVID 19 testing She is not  scheduled to have [name] [place]  19 testing until 11/15/18 related to her COPD issues on last week. She will go to [name] White. She then must await [name] COVID 19 test result prior to her returning home.  She has a sister for support. Her sister has her belongings. She has been getting all necessities delivered to her at [name] hotel. She reports being independent/assist with all White as she is on home oxygen.  She has been using [name] White for transportation recently but voices interest in finding out her [name] White transportation benefit  She was d/c with [name] White. She has had visits from RN and PT. She was seen today and will be seen tomorrow 11/15/18   Conditions:  Exposure to COVID 19 at snf, chronic hypoxic respiratory failure with a 2 L oxygen requirement, asthma leading to disability, paroxysmal atrial fibrillation on anticoagulation with Xarelto, prior pulmonary embolus in 2015, chronic back pain,GERD, post nasal drip, depression, vocal cord dysfunction,anxiety   DME: Eyeglasses, nebulizer, oxygen, concentrator, cane,oxygen tanks, walker (given to her at snf d/c) Her DME is via [name] White  She is requesting DME assist for a 3 n 1, tub/shower bench with a transfer extender, Rolator - wt 300 ht 5'5"   Medications: She denies concerns with taking medications as prescribed, affording medications and side effects of medications  She has questions about medications related to delivery of prescription services  White answered her question that [name] White does not have home delivery services for prescription and she would have to change to a company that delivers    Appointments: 11/02/18 last saw [name] White, no scheduled appt today  Needs ENT appointment with [name] White (  [address] [place]) in [name] afternoon  Advance Directives: Denies need for assist with advance directives   Consent: [name] White reviewed [facility] services with patient. Patient gave verbal consent for  services.    Plan [facility] RN White will refer [name] Friscia to [place] for Polypharmacy med review-patient taking greater than 10 meds Medication management-patient having trouble managing meds She wants assistance with getting set up for delivery of Rx medication services   [facility] RN White will follow up with [name] Ullmer within [name] next 4 business days related to DME and transportation benefit information   Pt encouraged to return a call to [place] White prn  [facility] RN White confirmed she received White outreach letter as discussed with [facility] brochure enclosed for review  Route note to MDs  [name] Millin L. Lavina Hamman, RN, BSN, [place] Coordinator Office number [phone] Mobile number [phone]  Main [name] number [phone] Fax number [phone]

## 2018-11-14 NOTE — Addendum Note (Signed)
Addended by: Barbaraann Faster on: 11/14/2018 05:55 PM   Modules accepted: Orders

## 2018-11-15 ENCOUNTER — Other Ambulatory Visit: Payer: Self-pay | Admitting: *Deleted

## 2018-11-15 ENCOUNTER — Telehealth: Payer: Self-pay | Admitting: *Deleted

## 2018-11-15 ENCOUNTER — Other Ambulatory Visit: Payer: Medicare HMO

## 2018-11-15 DIAGNOSIS — R6889 Other general symptoms and signs: Secondary | ICD-10-CM | POA: Diagnosis not present

## 2018-11-15 DIAGNOSIS — R269 Unspecified abnormalities of gait and mobility: Secondary | ICD-10-CM

## 2018-11-15 NOTE — Patient Outreach (Signed)
[facility]) Care Management  11/15/2018  [name] White 1955-10-15 [id number]   Care coordination  [facility] RN CM attempted to reach [name] to view information about her DME and appointment (ENT) needs  Outreach attempt unsuccessful No answer. [facility] RN CM left HIPAA compliant voicemail message along with CM's contact info. Also included her ENT appointment information as 11/20/18 1345 and encouraged her to call [place] care at [phone] to schedule her transportation    Plan: [place] scheduled this patient for another call attempt within 4 business days   [name] Lavina Hamman, RN, BSN, [place] Coordinator Office number [phone] Mobile number [phone]  Main [facility]N number [phone] Fax number [phone]

## 2018-11-15 NOTE — Patient Outreach (Signed)
[facility]) Care Management  11/15/2018  [name] White May 21, 1956 [id number]   Opened in error (duplicate)  Joelene Millin L. Lavina Hamman, RN, BSN, [place] Coordinator Office number [phone] Mobile number [phone]  Main [facility] number [phone] Fax number [phone]

## 2018-11-15 NOTE — Telephone Encounter (Signed)
Joelene Millin calls with DME request from patient.  Pt is requesting 3 things:  1. 3 n 1 bedside commode 2. tub/shower bench with a transfer extender 3. Rolator - wt 300 ht 5'5"  Will forward to MD.  Christen Bame, CMA

## 2018-11-15 NOTE — Patient Outreach (Signed)
[facility]) Care Management  11/15/2018  ZIYAH CORDOBA 10/31/1955 [id number]   Care coordination (DME, ENT Appointment)   Spoke with Janett Billow at Dr Dorise Bullion office about the DME needs (3 n 1, tub/shower bench with a transfer extender, Rolator - wt 300 ht 5'5")  from [employer] and change in insurance (pt has [employer] HMO and Medicaid_ Janett Billow confirms seeing the [place] coverage Assistance to be provided from MD office    Spoke with Alda Berthold at Dr Victorio Palm office to assist with getting Ms Siple an afternoon appointment as discussed Alda Berthold states Ms Nellums was noted to have not been seen in the office in the last  5 years until a Nov 02 2018 appointment . The office staff had tried to reschedule but was not able to successfully get in contact with Ms Mallette because all her demographic information was incorrect. Clarified the demographics with Rondel Jumbo assisted with a Tuesday Nov 20 2018 1345 appointment with Dr Wilburn Cornelia   Plan [facility] RN CM will follow up with Ms Gilberto within the next 4 business days related to DME and transportation benefit information    [place]. Lavina Hamman, RN, BSN, [place] Coordinator Office number [phone] Mobile number [phone]  Main THN number [phone] Fax number [phone]

## 2018-11-16 ENCOUNTER — Telehealth: Payer: Self-pay | Admitting: *Deleted

## 2018-11-16 ENCOUNTER — Ambulatory Visit: Payer: Self-pay | Admitting: *Deleted

## 2018-11-16 DIAGNOSIS — J441 Chronic obstructive pulmonary disease with (acute) exacerbation: Secondary | ICD-10-CM | POA: Diagnosis not present

## 2018-11-16 DIAGNOSIS — I4892 Unspecified atrial flutter: Secondary | ICD-10-CM | POA: Diagnosis not present

## 2018-11-16 DIAGNOSIS — I10 Essential (primary) hypertension: Secondary | ICD-10-CM | POA: Diagnosis not present

## 2018-11-16 DIAGNOSIS — I482 Chronic atrial fibrillation, unspecified: Secondary | ICD-10-CM | POA: Diagnosis not present

## 2018-11-16 DIAGNOSIS — M549 Dorsalgia, unspecified: Secondary | ICD-10-CM | POA: Diagnosis not present

## 2018-11-16 DIAGNOSIS — G8929 Other chronic pain: Secondary | ICD-10-CM | POA: Diagnosis not present

## 2018-11-16 DIAGNOSIS — J9611 Chronic respiratory failure with hypoxia: Secondary | ICD-10-CM | POA: Diagnosis not present

## 2018-11-16 DIAGNOSIS — G40909 Epilepsy, unspecified, not intractable, without status epilepticus: Secondary | ICD-10-CM | POA: Diagnosis not present

## 2018-11-16 DIAGNOSIS — M170 Bilateral primary osteoarthritis of knee: Secondary | ICD-10-CM | POA: Diagnosis not present

## 2018-11-16 NOTE — Telephone Encounter (Signed)
Tillie Rung from [facility] calling for PT verbal orders as follows:  2 time(s) weekly for 3 week(s)  You can leave verbal orders on confidential voicemail.  Christen Bame, CMA

## 2018-11-18 LAB — NOVEL CORONAVIRUS, NAA: SARS-CoV-2, NAA: NOT DETECTED

## 2018-11-20 ENCOUNTER — Other Ambulatory Visit: Payer: Self-pay

## 2018-11-20 ENCOUNTER — Telehealth: Payer: Self-pay | Admitting: Pharmacist

## 2018-11-20 ENCOUNTER — Other Ambulatory Visit: Payer: Self-pay | Admitting: *Deleted

## 2018-11-20 NOTE — Patient Outreach (Signed)
[facility] [facility]) Care Management  11/20/2018  [name] White 18-Jun-1956 [id number]   Patient was called regarding medication assistance, review, and set up for pharmacy delivery.  Unfortunately, she did not answer her phone. HIPAA compliant message was left on her voicemail.  Plan: Send patient an unsuccessful outreach letter. Call patient back in 5-7 business days.  Elayne Guerin, PharmD, [place] Clinical Pharmacist [phone]

## 2018-11-20 NOTE — Telephone Encounter (Signed)
Family Medicine Telephone Note  Gave verbal orders to secure voicemail

## 2018-11-20 NOTE — Patient Outreach (Addendum)
[facility]) Care Management  11/20/2018  ELANE PEABODY 11/26/1955 [id number]   Care coordination  Patient returned a call to [facility] RN CM Patient is able to verify HIPAA  Consent: [facility] RN CM reviewed [facility] services with patient. Patient gave verbal consent for services.   She informs [facility] RN CM she had received CM's message left on 11/15/18 but had not been about to get set up with [employer] for her transportation services nor in contact with her DSS medicaid transportation assigned staff, therefore, she would not be able to go to the Tuesday 1345 appointment with Dr Elmyra Ricks maker, ENT. She confirms having DSS and Dr Wilburn Cornelia contact numbers. She has postponed her appointment with Dr Wilburn Cornelia today   [facility] RN CM provided her again the number for [employer] as [phone] and had her to repeat back the number. She reports she is to call today   HOUSING - On today's call she asked about possible assist with housing She reports the [employer] gave her a list of senior residential housing and she has called them but they have long waiting lists. She is interested in roommates if needed. She reports not being able to return to stay with her sister related "to my medical issues." when CM inquired. She states not preferring to stay with her sister. She states if she needs to do a boarding home she would try. She still remains at the [employer] [facility] until her COVID testing returns. Per Epic results her test results for COVID 19 are negative She reports also that she is registered with a room mate program vis Social services online to match her up with a possible room mate. She reports this site requests criminal background checks  She and RN CM also reviewed that CM spoke with Janett Billow at Dr Dorise Bullion office about her DME requests and she should receive a call from [place].RN CM requested that she discuss her medicaid coverage also with [employer] and to request her medicaid number to  offer providers until she receives her new medicaid card. She voiced understanding   Conditions:chronic hypoxic respiratory failure with a 2 L oxygen requirement, asthma leading to disability, proxysmal atrial fibrillation on anticoagulation with Xarelto, prior pulmonary embolus in 2015, chronic back pain,GERD, post nasal drip, depression, vocal cord dysfunction,anxiety, sleep apnea, restless leg syndrome, overactive bladder. Osteoarthritis. Recurrent severe MDD Major depressive disorder), hx of atrial flutter,  Hx of HTN, hx of pulmonary embolus, exposure to [place] RN CM will refer Ms Mulkern to [facility] for housing assistance  Case closure discussed and Ms Drone was encouraged to return a call to CM if other issues arise Route note to MDs  Joelene Millin L. Lavina Hamman, RN, BSN, [place] Coordinator Office number [phone] Mobile number [phone]  Main [facility]N number [phone] Fax number [phone]

## 2018-11-21 ENCOUNTER — Other Ambulatory Visit: Payer: Self-pay | Admitting: Pharmacist

## 2018-11-21 ENCOUNTER — Other Ambulatory Visit: Payer: Self-pay | Admitting: *Deleted

## 2018-11-21 NOTE — Patient Outreach (Signed)
[facility]) Care Management  11/21/2018  KAYLIA WINBORNE December 31, 1955 [id number]   Patient called me back and left a message for a return call. Patient was called back. Unfortunately, she did not answer the phone. Another HIPAA compliant message was left.  Plan: Call patient back at originally schedule follow up.  Elayne Guerin, PharmD, [place] Clinical Pharmacist [phone]

## 2018-11-21 NOTE — Patient Outreach (Signed)
[facility]) Care Management  11/21/2018  CLEMMA JOHNSEN [birth year] [id number]   Care coordination   Ms Rasmussen left [facility] RN CM a voice message at [phone] 11/21/18 requesting a return call  [facility] RN CM returned her call  Outreach attempt # 1 No answer. [facility] RN CM left HIPAA compliant voicemail message along with CM's contact info.   Plan: [facility] RN CM will speak with pt and attempt to assist pending a return call   [place]. Lavina Hamman, RN, BSN, [place] Coordinator Office number [phone] Mobile number [phone]  Main THN number [phone] Fax number [phone]

## 2018-11-22 ENCOUNTER — Other Ambulatory Visit: Payer: Self-pay

## 2018-11-22 DIAGNOSIS — M549 Dorsalgia, unspecified: Secondary | ICD-10-CM | POA: Diagnosis not present

## 2018-11-22 DIAGNOSIS — I4892 Unspecified atrial flutter: Secondary | ICD-10-CM | POA: Diagnosis not present

## 2018-11-22 DIAGNOSIS — J441 Chronic obstructive pulmonary disease with (acute) exacerbation: Secondary | ICD-10-CM | POA: Diagnosis not present

## 2018-11-22 DIAGNOSIS — I482 Chronic atrial fibrillation, unspecified: Secondary | ICD-10-CM | POA: Diagnosis not present

## 2018-11-22 DIAGNOSIS — M170 Bilateral primary osteoarthritis of knee: Secondary | ICD-10-CM | POA: Diagnosis not present

## 2018-11-22 DIAGNOSIS — J9611 Chronic respiratory failure with hypoxia: Secondary | ICD-10-CM | POA: Diagnosis not present

## 2018-11-22 DIAGNOSIS — G40909 Epilepsy, unspecified, not intractable, without status epilepticus: Secondary | ICD-10-CM | POA: Diagnosis not present

## 2018-11-22 DIAGNOSIS — G8929 Other chronic pain: Secondary | ICD-10-CM | POA: Diagnosis not present

## 2018-11-22 DIAGNOSIS — I10 Essential (primary) hypertension: Secondary | ICD-10-CM | POA: Diagnosis not present

## 2018-11-22 NOTE — Patient Outreach (Signed)
[facility]) Care Management  11/22/2018  MIKALYN HERMIDA 1955-12-01 [id number]   Unsuccessful outreach to patient regarding social work referral for housing resources.  BSW left voicemail message.  Unsuccessful outreach letter mailed by [facility] Pharmacist, Denyse Amass, on 11/20/18.  Will attempt to reach again within four business days.  Ronn Melena, BSW Social Worker [phone]

## 2018-11-26 ENCOUNTER — Other Ambulatory Visit: Payer: Self-pay | Admitting: *Deleted

## 2018-11-26 ENCOUNTER — Ambulatory Visit: Payer: Self-pay

## 2018-11-26 ENCOUNTER — Other Ambulatory Visit: Payer: Self-pay

## 2018-11-26 ENCOUNTER — Other Ambulatory Visit: Payer: Self-pay | Admitting: Pharmacist

## 2018-11-26 NOTE — Patient Outreach (Signed)
[facility]) Care Management  11/26/2018  [name] White February 21, 1956 [id number]   Care coordination  [facility] RN CM has received a voice message from [name] White (from [phone]) She inquired if her Primary MD could renew her prescriptions if the medications were ordered while at [place] RN CM returned call to her but there was no answer. [facility] RN CM left HIPAA compliant voicemail message along with CM's contact info. CM encouraged her to call her primary MD office who should be able to assist her if she has had a follow appointment or telemedicine call. Epic indicates a telemedicine call to [name] White with Dr Kris Mouton on 11/02/18. Dr Kris Mouton office automated services at the refill prompt states [name] White needs to request the pharmacy to call the MD office for any refills. She was also informed that she could take the prescriptions to her local pharmacy and have the pharmacy call Dr Kris Mouton office to have medications refilled   CM also encouraged her to call [place] and [place] staff. CM left the contact number for each [facility] staff member she needs to return a call to    Plan  Routed note to Dr Florina Ou MD office    Joelene Millin L. Lavina Hamman, RN, BSN, [place] Coordinator Office number [phone] Mobile number [phone]  Main [facility] number [phone] Fax number [phone]

## 2018-11-26 NOTE — Addendum Note (Signed)
Addended by: Ronn Melena E on: 11/26/2018 11:12 AM   Modules accepted: Orders

## 2018-11-26 NOTE — Patient Outreach (Addendum)
[facility]) Care Management  11/26/2018  [name] White [birth year] [id number]   Second unsuccessful outreach to patient regarding social work referral for housing resources.  BSW left voicemail message.  Unsuccessful outreach letter mailed by Pharmacist, Denyse Amass, on 11/20/18.  Will attempt to reach again within four business days.    Addendum: BSW received return call from patient.  BSW and patient discussed current housing situation and patient is currently seeking an ALF.  She reports being recently approved for Medicaid.  She believes that an FL2 was completed by the SW at [facility] but is unsure of status.  BSW will place an order to CSW to assist with navigating this process. BSW closing case.   Ronn Melena, BSW Social Worker [phone]

## 2018-11-26 NOTE — Telephone Encounter (Signed)
[name] White calls to check status.  Will forward to Dr. Kris Mouton to check status.  Would like to get DME in home before end of PT, so that they can train her with these.  Christen Bame, CMA

## 2018-11-26 NOTE — Telephone Encounter (Signed)
Opened in error. [name] White, CMA  

## 2018-11-26 NOTE — Patient Outreach (Signed)
[place] [facility]) Care Management  11/26/2018  [name] White [birth year] [id number]   Care coordination (Prescriptions, DME, ALF)   Patient returned a call to [facility] RN CM Patient is able to verify HIPAA Reviewed and addressed her inquiry about prescriptions and discussed her concerns about ALF with her  She reports receiving CM's messages but her transcription left out information She and CM discussed that she can get her prescriptions filled by either calling MD office, going to her local pharmacy or calling the local pharmacy. She voiced  Understanding She discussed that she is interested and has called only 2 ALF (assisted living facility) [facility] and [facility]. She is concern that each per Ms Berni is "private pay" Cm encouraged her to speak with [facility] LCSW who is more aware of the requirement, payment and possible COVID waivers related to ALF. She voiced understanding She has not heard from her primary MD about the request for DME at this time. CM discussed CM would speak again with MD office staff about DME and her prescription needs She voiced appreciation  She was given [place] staff number Alwyn Ren) She states she will call [place] after speaking with CM   Advanced home care therapist returns tomorrow 11/27/18 per Ms Casavant and it would be good if the therapist could work with her related to DME    Consent: [facility] RN CM reviewed [facility] services with patient. Patient gave verbal consent for services for [facility] telephonic RN CM, [facility] SW and [facility] pharmacy.  Plan [facility] RN CM called Primary MD office and spoke again with Janett Billow about the 11/15/18 requested DME and pt's concern today with getting prescriptions filled that were ordered at [place] care snf Discussed CM encouraged pt to call the office or pharmacy about these medications also    Routed note to MD- Pt will need MD assist for [facility] for ALF, assist with DME orders to [employer] and refill of  medicines  Yuval Nolet L. Lavina Hamman, RN, BSN, [place] Coordinator Office number [phone] Mobile number [phone]  Main [facility]N number [phone] Fax number [phone]

## 2018-11-27 ENCOUNTER — Ambulatory Visit: Payer: Self-pay

## 2018-11-27 DIAGNOSIS — G40909 Epilepsy, unspecified, not intractable, without status epilepticus: Secondary | ICD-10-CM | POA: Diagnosis not present

## 2018-11-27 DIAGNOSIS — J9611 Chronic respiratory failure with hypoxia: Secondary | ICD-10-CM | POA: Diagnosis not present

## 2018-11-27 DIAGNOSIS — G8929 Other chronic pain: Secondary | ICD-10-CM | POA: Diagnosis not present

## 2018-11-27 DIAGNOSIS — I4892 Unspecified atrial flutter: Secondary | ICD-10-CM | POA: Diagnosis not present

## 2018-11-27 DIAGNOSIS — I482 Chronic atrial fibrillation, unspecified: Secondary | ICD-10-CM | POA: Diagnosis not present

## 2018-11-27 DIAGNOSIS — J441 Chronic obstructive pulmonary disease with (acute) exacerbation: Secondary | ICD-10-CM | POA: Diagnosis not present

## 2018-11-27 DIAGNOSIS — M170 Bilateral primary osteoarthritis of knee: Secondary | ICD-10-CM | POA: Diagnosis not present

## 2018-11-27 DIAGNOSIS — M549 Dorsalgia, unspecified: Secondary | ICD-10-CM | POA: Diagnosis not present

## 2018-11-27 DIAGNOSIS — I10 Essential (primary) hypertension: Secondary | ICD-10-CM | POA: Diagnosis not present

## 2018-11-27 MED ORDER — BARIATRIC ROLLATOR MISC: 1.0000 | Freq: Once | 0 refills | Status: DC

## 2018-11-27 NOTE — Patient Outreach (Signed)
[facility]) Care Management  [place]   11/27/2018  [name] White [birth year] [id number]  Reason for referral: Medication Adherence  Referral source: [facility] RN Current insurance: [employer]  PMHx includes but not limited to:   Asthma, A Fib, Atrial Flutter, GERD, hypertension, history of PE, depression, osteoarthritis of knee, overactive bladder, RLS, allergic rhinitis, morbid obesity, SOB, and   Outreach:  Successful telephone call with patient.  HIPAA identifiers verified.   Subjective:  Patient reports having difficulty organizing her medications and said she was interested in having them delivered to her in "pill packs".  Patient reports using a pill box as an adherence strategy.  Does the patient ever forget to take medication?  yes Does the patient have problems obtaining medications due to transportation?   yes Does the patient have problems obtaining medications due to cost?  yes  Does the patient feel that medications prescribed are effective?  no Does the patient ever experience any side effects to the medications prescribed?  yes  Does the patient measure his/her own blood glucose at home?  No Does the patient measure his/her own blood pressure at home? No   Objective: The ASCVD Risk score Mikey Bussing DC Jr., et al., 2013) failed to calculate for the following reasons:   Cannot find a previous HDL lab   Cannot find a previous total cholesterol lab  Lab Results  Component Value Date   CREATININE 0.68 09/11/2018   CREATININE 0.59 09/10/2018   CREATININE 0.74 09/09/2018    Lab Results  Component Value Date   HGBA1C 6.2 (H) 09/10/2018    Lipid Panel     Component Value Date/Time   CHOL 205 (H) 09/08/2015 0655   TRIG 81 09/08/2015 0655   HDL 87 09/08/2015 0655   CHOLHDL 2.4 09/08/2015 0655   VLDL 16 09/08/2015 0655   LDLCALC 102 (H) 09/08/2015 0655    BP Readings from Last 3 Encounters:  09/13/18 139/87  09/07/18 (!) 141/79   08/20/18 137/76    Allergies  Allergen Reactions  . Caffeine Other (See Comments)    Migraine  . Vicodin [Hydrocodone-Acetaminophen] Nausea And Vomiting and Other (See Comments)    Headache   . Hydralazine Hcl     Other reaction(s): Other (See Comments) AKI leading to rhabdo and electrolyte abnormalities  . Hydrocodone-Acetaminophen Nausea And Vomiting  . Ciprofloxacin Hives and Rash  . Erythromycin Hives and Rash  . Lisinopril Cough    Other reaction(s): Cough  . Oxycodone Nausea And Vomiting    headache Other reaction(s): Vomiting  . Sulfamethoxazole-Trimethoprim Rash    Medications Reviewed Today    Reviewed by Elayne Guerin, [facility] (Pharmacist) on 11/26/18 at 1608  Med List Status: <None>  Medication Order Taking? Sig Documenting Provider Last Dose Status Informant  acetaminophen (TYLENOL) 325 MG tablet [id number] Yes Take 2 tablets (650 mg total) by mouth every 6 (six) hours as needed for mild pain. Georgette Shell, MD Taking Active   albuterol (PROVENTIL) (2.5 MG/3ML) 0.083% nebulizer solution [id number] Yes Take 3 mLs (2.5 mg total) by nebulization every 6 (six) hours as needed for wheezing or shortness of breath. Collene Gobble, MD Taking Active Self  azelastine (OPTIVAR) 0.05 % ophthalmic solution [id number] Yes Place 1 drop into both eyes daily. [provider] Taking Active Self  cyclobenzaprine (FLEXERIL) 10 MG tablet [id number] Yes Take 1 tablet by mouth every 8 (eight) hours as needed. [provider] Taking Active   cycloSPORINE (RESTASIS) 0.05 % ophthalmic  emulsion [id number] Yes Place 1 drop into both eyes 2 (two) times daily. [provider] Taking Active Self  diclofenac sodium (VOLTAREN) 1 % GEL [id number] Yes APPLY 2 GRAMS EXTERNALLY TO THE AFFECTED AREA FOUR TIMES DAILY  Patient taking differently:  Apply 2 g topically 4 (four) times daily.    [name], MD Taking Active Self  diltiazem (CARDIZEM CD) 120 MG 24 hr capsule  [id number] Yes Take 1 capsule (120 mg total) by mouth daily. Please keep upcoming appt in January before anymore refills. Final Attempt Patsey Berthold, NP Taking Active Self  famotidine (PEPCID) 20 MG tablet [id number] Yes Take 1 tablet (20 mg total) by mouth daily. Mariel Aloe, MD Taking Active   flecainide [facility]) 100 MG tablet [id number] Yes Take 1 tablet (100 mg total) by mouth 2 (two) times daily. Please keep upcoming appt in January before anymore refills. Final Attempt  Patient taking differently:  Take 100 mg by mouth 2 (two) times daily.    Patsey Berthold, NP Taking Active Self        Discontinued 59/56/38 7564 (Duplicate)   fluticasone (FLONASE) 50 MCG/ACT nasal spray [id number] Yes Place 2 sprays into both nostrils every morning. [provider] Taking Active   hydrocortisone (ANUSOL-HC) 25 MG suppository [id number] Yes Place 1 suppository (25 mg total) rectally 2 (two) times daily. [place] Eric J, DO Taking Active Self           Med Note Corinna Lines Nov 26, 2018  4:07 PM) PRN  hydrOXYzine (ATARAX/VISTARIL) 25 MG tablet [id number] Yes Take 25 mg by mouth 2 (two) times daily as needed for anxiety. [provider] Taking Active Self  loratadine (CLARITIN) 10 MG tablet [id number] Yes Take 10 mg by mouth daily. [provider] Taking Active Self  magnesium oxide (MAG-OX) 400 (241.3 Mg) MG tablet [id number] Yes Take 1 tablet (400 mg total) by mouth 2 (two) times daily. [place] Donnamarie Poag, DO Taking Active Self  metoprolol tartrate (LOPRESSOR) 25 MG tablet [id number] Yes Take 1 tablet (25 mg total) by mouth 2 (two) times daily. [place] Eric J, DO Taking Active Self  OXYGEN [id number] Yes 2L at bedtime and prn exertion [provider] Taking Active   pantoprazole (PROTONIX) 40 MG tablet [id number] Yes Take 1 tablet (40 mg total) by mouth 2 (two) times daily. [place] Eric J, DO Taking Active Self  potassium chloride SA (K-DUR,KLOR-CON) 20 MEQ tablet  [id number] Yes Take 2 tablets (40 mEq total) by mouth 2 (two) times daily. [place] Eric J, DO Taking Active Self  rivaroxaban (XARELTO) 20 MG TABS tablet [id number] Yes TAKE 1 TABLET(20 MG) BY MOUTH DAILY AFTER SUPPER Patsey Berthold, NP Taking Active Self           Med Note Truitt Leep, Francetta Found Aug 29, 2018 12:57 PM)    traMADol (ULTRAM) 50 MG tablet [id number] Yes Take 1 tablet by mouth every 8 (eight) hours as needed. [provider] Taking Active   venlafaxine XR (EFFEXOR-XR) 150 MG 24 hr capsule [id number] Yes Take 150 mg by mouth daily with breakfast. [provider] Taking Active Self  VENTOLIN HFA 108 (90 Base) MCG/ACT inhaler [id number] Yes INHALE 2 PUFFS INTO THE LUNGS EVERY 6 HOURS AS NEEDED FOR WHEEZING OR SHORTNESS OF Florene Glen, MD Taking Active   witch hazel-glycerin (TUCKS) pad [id number] Yes Apply topically as needed for itching. [place] Eric J, DO Taking Active Self  Med Note Elayne Guerin   Mon Nov 26, 2018  4:08 PM) PRN          Assessment: Drugs sorted by system:  Neurologic/Psychologic: Hydroxyzine, Venlafaxine  Cardiovascular:  Albuterol  Nebulizer solution, Azelastine Eye drops,  Diltiazem, Flecainide, Metoprolol tartrate,  Xarelto,   Pulmonary/Allergy: Fluticasone nasal spray, Loratadine, Oxygen, Ventolin HFA  Gastrointestinal: Famotidine, Hydrocortisone Suppositories, Pantoprazole,   Topical: Diclofenac Gel  Pain: Acetaminophen, Cyclobenzaprine, Tramadol,    Vitamins/Minerals/Supplements: Magnesium Oxide, Potassium Chloride,   Miscellaneous: Cyclosporine, Tucks Pads (witch hazel)  Medication Adherence Findings: Adherence Review  []  Excellent (no doses missed/week)     []  Good (no more than 1 dose missed/week)     [x]  Partial (2-3 doses missed/week) []  Poor (>3 doses missed/week)  Patient with good understanding of regimen and good understanding of indications.     Pill pack options were discussed with  the patient.   Medication Assistance Findings:  No medication assistance needs identified  Extra Help:  Already receiving Full Extra Help Low Income Subsidy   Several options were reviewed with the patient for Pill Packing.   She said she wanted to wait until she moved to have the service started. Patient said she was in the process of going back into a SNF because she did not feel like she was able to take care of herself at home.  She already has a Education officer, museum completing the appropriate paperwork to have her placed.   Plan: . Will follow-up in 1 week.  . Route note to PCP  Elayne Guerin, PharmD, [place] Clinical Pharmacist [phone]

## 2018-11-27 NOTE — Addendum Note (Signed)
Addended by: Christen Bame D on: 11/27/2018 02:07 PM   Modules accepted: Orders

## 2018-11-27 NOTE — Telephone Encounter (Signed)
Community message sent to [employer] and Darlina Guys @ [facility] to process DME order for   1. 3 n 1 bedside commode 2. tub/shower bench with a transfer extender 3. Rolator - wt 300 ht 5'5".   Christen Bame, CMA

## 2018-11-27 NOTE — Addendum Note (Signed)
Addended by: Ronn Melena E on: 11/27/2018 01:34 PM   Modules accepted: Orders

## 2018-11-27 NOTE — Addendum Note (Signed)
Addended by: Christen Bame D on: 11/27/2018 10:47 AM   Modules accepted: Orders

## 2018-11-27 NOTE — Telephone Encounter (Signed)
Order corrected and [facility] will process.  Christen Bame, CMA

## 2018-11-27 NOTE — Telephone Encounter (Signed)
Telephone note  Placed order for rolling walker, bedside commode, and shower bence with extender and forwarded to rn team  [name] MD PGY-2 Family Medicine Resident

## 2018-11-28 ENCOUNTER — Telehealth: Payer: Self-pay | Admitting: *Deleted

## 2018-11-28 ENCOUNTER — Encounter: Payer: Self-pay | Admitting: *Deleted

## 2018-11-28 ENCOUNTER — Ambulatory Visit: Payer: Medicare HMO | Admitting: Pharmacist

## 2018-11-28 ENCOUNTER — Other Ambulatory Visit: Payer: Self-pay | Admitting: *Deleted

## 2018-11-28 NOTE — Telephone Encounter (Signed)
Care Coordination  Telephone Outreach Note 11/28/2018  Type of Service: General Social Work Consult .  LCSW received message from CMA that patient needs help with getting an FL2.   Patient is active with [facility] social worker who has made attempts to contact patient.  Message sent to [place] for care coordination.  Plan:  This LCSW will not contact patient until [name] completes her assessment to coordinate patient's needs.  [name], LCSW [facility]   [phone] 1:28 PM

## 2018-11-28 NOTE — Telephone Encounter (Signed)
Pt needs help with getting an FL2 form completed.  She is placing herself in an assisted living facility.  Will forward to Education officer, museum, Merchant navy officer. Christen Bame, CMA

## 2018-11-28 NOTE — Patient Outreach (Signed)
[facility]) Care Management  11/28/2018  [name] White 02-Jan-1956 [id number]   CSW made an initial attempt to try and contact patient today to perform the initial phone assessment, as well as assess and assist with social work needs and services, without success.  A HIPAA compliant message was left for patient on voicemail.  CSW is currently awaiting a return call.  CSW will make a second outreach attempt within the next 3-4 business days, if a return call is not received from patient in the meantime.  CSW will also mail an Outreach Letter to patient's home requesting that patient contact CSW if patient is interested in receiving social work services through [place] with Scientist, clinical (histocompatibility and immunogenetics). [name], BSW, MSW, LCSW  Licensed Education officer, environmental Health System  Mailing [place] [address], [place], [place] [address] Physical Address-[address], [place], [place] [address] Toll Free Main # [phone] Fax # [phone] Cell # [phone]  Office # [phone] [name]

## 2018-11-29 ENCOUNTER — Other Ambulatory Visit: Payer: Self-pay

## 2018-11-29 ENCOUNTER — Telehealth: Payer: Self-pay

## 2018-11-29 ENCOUNTER — Other Ambulatory Visit: Payer: Self-pay | Admitting: *Deleted

## 2018-11-29 ENCOUNTER — Encounter: Payer: Self-pay | Admitting: *Deleted

## 2018-11-29 ENCOUNTER — Telehealth (INDEPENDENT_AMBULATORY_CARE_PROVIDER_SITE_OTHER): Payer: Medicare HMO | Admitting: Family Medicine

## 2018-11-29 DIAGNOSIS — M549 Dorsalgia, unspecified: Secondary | ICD-10-CM | POA: Diagnosis not present

## 2018-11-29 DIAGNOSIS — G40909 Epilepsy, unspecified, not intractable, without status epilepticus: Secondary | ICD-10-CM | POA: Diagnosis not present

## 2018-11-29 DIAGNOSIS — Z5189 Encounter for other specified aftercare: Secondary | ICD-10-CM

## 2018-11-29 DIAGNOSIS — R0602 Shortness of breath: Secondary | ICD-10-CM

## 2018-11-29 DIAGNOSIS — M170 Bilateral primary osteoarthritis of knee: Secondary | ICD-10-CM | POA: Diagnosis not present

## 2018-11-29 DIAGNOSIS — J441 Chronic obstructive pulmonary disease with (acute) exacerbation: Secondary | ICD-10-CM | POA: Diagnosis not present

## 2018-11-29 DIAGNOSIS — I10 Essential (primary) hypertension: Secondary | ICD-10-CM | POA: Diagnosis not present

## 2018-11-29 DIAGNOSIS — I482 Chronic atrial fibrillation, unspecified: Secondary | ICD-10-CM | POA: Diagnosis not present

## 2018-11-29 DIAGNOSIS — G8929 Other chronic pain: Secondary | ICD-10-CM | POA: Diagnosis not present

## 2018-11-29 DIAGNOSIS — I4892 Unspecified atrial flutter: Secondary | ICD-10-CM | POA: Diagnosis not present

## 2018-11-29 DIAGNOSIS — J9611 Chronic respiratory failure with hypoxia: Secondary | ICD-10-CM | POA: Diagnosis not present

## 2018-11-29 NOTE — Telephone Encounter (Signed)
Tillie Rung, PT, called nurse line stating the patients resting heart rate today was 118. Tillie Rung stated the patient has been compliant with medications, and she is unsure why her HR was higher today. Patients baseline is usually around 80. Tillie Rung stated she had no other apparent symptoms. Denied dizziness and chest pain. Tillie Rung did say she used her inhaler, but this is not uncommon for her during PT. Will forward to PCP.

## 2018-11-29 NOTE — Patient Outreach (Signed)
  [facility]) Care Management  11/29/2018  [name] White 01-Apr-1956 [id number]   Care coordination  [facility] RN CM received a call from [name]  She informs CM she would like to return to [facility] care snf She states she feels she is not improving at home with [facility] at home therapy, not able to bath herself with issues r/t her shoulders to elbows  She states she spoke with [facility] health care staff who shared with her the process to return (need to get FL2 from Dr Kris Mouton and meet requirements for facility placement) She voices she understands this. [facility] RN CM discussed with her that she was informed appropriately and encouraged her to make an appointment with Dr Kris Mouton for assistance from Dr Kris Mouton, his staff and [facility] home therapy staff for completion of FL2 and placement requirements. She voiced appreciation  Plan [facility] RN CM sent an in basket message to [place], [place] and [facility], [place]. [facility] and [facility] pharmacy have made attempts to reach [name], pending a return call  [facility] RN CM will assist [facility] prn   Routed note to MD  O'Brien. Lavina Hamman, RN, BSN, [place] Coordinator Office number [phone] Mobile number [phone]  Main [facility] number [phone] Fax number [phone]

## 2018-11-30 ENCOUNTER — Encounter: Payer: Self-pay | Admitting: *Deleted

## 2018-11-30 ENCOUNTER — Encounter: Payer: Self-pay | Admitting: Licensed Clinical Social Worker

## 2018-11-30 ENCOUNTER — Other Ambulatory Visit: Payer: Self-pay | Admitting: *Deleted

## 2018-11-30 NOTE — Patient Outreach (Signed)
[facility]) Care Management  11/30/2018  [name] White [birth year] [id number]   CSW received a return call from patient today, in response to the HIPAA compliant message left on voicemail for patient by CSW on Wednesday, November 28, 2018.  CSW was able to perform the initial phone assessment on patient, as well as assess and assist with social work needs and services.  CSW introduced self, explained role and types of services provided through [facility] [facility] ([place]).  CSW further explained to patient that CSW works with patient's RNCM, also with [facility], [name] White.  CSW then explained the reason for the call, indicating that [name] White thought that patient would benefit from social work services and resources to assist with placement into a skilled nursing facility to receive short-term rehabilitative services before being placed into a long-term care assisted living facility.  CSW obtained two HIPAA compliant identifiers from patient, which included patient's name and date of birth.  Patient admitted that she is now actually interested in short-term skilled nursing placement for rehabilitative services, prior to being placed into an assisted living facility for long-term care services.  Patient went on to say that she has been to [facility], in the past and that she would like to be able to return there as soon as possible.  CSW placed a call to [name] White, Admissions Director at [facility], who reports that it may be extremely difficult for her to get her superiors to agree to admit a new resident into their facility, due to current COVID-19 restrictions.  [name] White went on to explain that she is also having a difficult time getting [employer] Medicare to approve placement from home.  CSW agreed to fax a completed and signed FL-2 Form to [name] White, along with  patient's current medication list, home health physical therapy notes and basic demographic information, for their review.  In the meantime, CSW was able to make contact with patient's Home Health Physical Therapist, [name] White (# [phone]) with [facility], to request that she fax me all of her physical therapy notes on patient.  [name] White was more than happy to do so, indicating that she would get these notes faxed to [place] at her earliest convenience.  CSW noted that patient has not signed a consent with [place] [facility]; therefore, CSW explained to patient that CSW will need to have a consent on file before CSW is able to fax patient's FL-2 Form, or any other pertinent information, to anyone outside the [facility] system.  Patient voiced understanding, encouraging CSW to fax her a consent to # [phone] [employer] [address]), where patient currently resides, and that she will complete and sign the consent and then fax back to CSW as soon as possible.  CSW was able to complete an FL-2 Form on patient, with the assistance of [name] White, Licensed [place] representative at the [employer].  [name] White submitted the FL-2 Form to patient's Primary Care Physician, [name] White to request review and signature.  Once the FL-2 Form has been signed, physical therapy notes received and CSW has obtained the written consent from patient, [name] White has agreed to show CSW how to submit all documents to [facility], via [place], for their review and placement consideration.  CSW was able to recite to patient the conversation that CSW had with [name] White,  also explaining to patient that patient would need to have a negative COVID-19 Screening within 3 days of admission into [facility], if they are even willing to accept her into their facility.  Patient admitted that this may present a problem for her, as she does not  drive, relying solely on public transportation.    CSW encouraged patient to "not get hung up" on the transportation piece, as CSW is willing to assist patient with transportation arrangements, if necessary.  CSW agreed to follow-up with patient as soon as all information has been faxed to [facility] and CSW is able to follow-up with [name] White regarding a bed offer.  CSW is hopeful that this process will take place on Monday, December 03, 2018, as CSW is aware that patient is anxious to begin extensive therapies (both physical and occupational), as soon as possible.  Patient was greatly appreciative of all assistance offered by CSW today and of CSW's willingness to continue to try and pursue placement for her, despite the many challenges being faced.  CSW was able to confirm that patient has the correct contact information for CSW, encouraging patient to contact CSW directly if additional social work needs arise in the near future.  [name] White, BSW, MSW, LCSW  Licensed Education officer, environmental Health System  Mailing [place] [address], [place], [place] [address] Physical Address-[address], [place], [place] [address] Toll Free Main # [phone] Fax # [phone] Cell # [phone]  Office # [phone] [name]

## 2018-11-30 NOTE — NC FL2 (Signed)
[place] LEVEL OF CARE SCREENING TOOL     IDENTIFICATION  Patient Name: [name] White Birthdate: May 05, 1956 Sex: female Admission Date (Current Location): (Not on file)  [place] and [place] Number:  [name] [id number] [place] and Address:  The [place]. [facility], [place] [address], [place], [place] [address]      Provider Number: [id number]  Attending Physician Name and Address:  No att. providers found  Relative Name and Phone Number:  Rowan Blase (Sister) # [phone]     Current Level of Care: Home Recommended Level of Care: [place] Prior Approval Number:    Date Approved/Denied:   PASRR Number:    Discharge Plan: SNF    Current Diagnoses: Patient Active Problem List   Diagnosis Date Noted  . Exposure to Covid-19 Virus 11/02/2018  . Reactive airway disease 09/10/2018  . Shortness of breath 09/09/2018  . Acute bronchitis 08/29/2018  . Acute on chronic respiratory failure with hypoxia ([place]) 08/29/2018  . Sleep apnea 07/05/2018  . Chronic respiratory failure with hypoxia ([place]) 07/05/2018  . Osteoarthritis 03/02/2018  . Viral illness 02/13/2018  . Nausea 02/13/2018  . Left medial tibial stress syndrome 12/26/2017  . Hematoma 11/27/2017  . Severe episode of recurrent major depressive disorder, without psychotic features ([place])   . MDD (major depressive disorder), recurrent episode, severe ([place]) 09/07/2015  . Atrial flutter ([place]) 07/29/2015  . History of atrial flutter 06/26/2015  . Chest pain 03/22/2015  . Asthma 11/12/2014  . History of hypertension 11/05/2014  . Atrial fibrillation, chronic 11/03/2014  . Chronic anticoagulation 11/03/2014  . History of pulmonary embolus (PE) 11/03/2014  . Non-traumatic rhabdomyolysis 11/03/2014  . Major depressive disorder 11/03/2014  . SOB (shortness of breath)   . MDD (major depressive disorder), recurrent severe, without psychosis ([place]) 03/05/2014  . Pulmonary embolism  ([place]) 12/27/2013  . Vocal cord dysfunction 11/12/2013  . Mixed headache 01/06/2012  . OVERACTIVE BLADDER 04/18/2008  . Osteoarthritis of both knees 04/18/2008  . POLYARTHRITIS 09/14/2007  . Morbid obesity ([place]) 08/24/2006  . RESTLESS LEGS SYNDROME 08/24/2006  . HYPERTENSION, BENIGN SYSTEMIC 08/24/2006  . RHINITIS, ALLERGIC 08/24/2006  . GASTROESOPHAGEAL REFLUX, NO ESOPHAGITIS 08/24/2006    Orientation RESPIRATION BLADDER Height & Weight     Self, Time, Situation, Place  O2(2 Liters per nasal cannula during exercertion and while asleep.) Incontinent Weight:   Height:     BEHAVIORAL SYMPTOMS/MOOD NEUROLOGICAL BOWEL NUTRITION STATUS  (None) (None) Continent Diet(Heart healthy.)  AMBULATORY STATUS COMMUNICATION OF NEEDS Skin   Limited Assist(Requires wheelchair for long distances.) Verbally Normal                       Personal Care Assistance Level of Assistance  Bathing, Dressing Bathing Assistance: Limited assistance   Dressing Assistance: Limited assistance     Functional Limitations Info  Sight(Reading glasses.) Sight Info: Impaired        SPECIAL CARE FACTORS FREQUENCY  PT (By licensed PT), OT (By licensed OT)     PT Frequency: 5 x's per week. OT Frequency: 5 x's per week.            Contractures Contractures Info: Not present    Additional Factors Info  Code Status, Allergies Code Status Info: Full Code Allergies Info: Allergies           Current Medications (11/30/2018):  This is the current hospital active medication list Current Outpatient Medications  Medication Sig Dispense Refill  . acetaminophen (TYLENOL) 325  MG tablet Take 2 tablets (650 mg total) by mouth every 6 (six) hours as needed for mild pain.    Marland Kitchen albuterol (PROVENTIL) (2.5 MG/3ML) 0.083% nebulizer solution Take 3 mLs (2.5 mg total) by nebulization every 6 (six) hours as needed for wheezing or shortness of breath. 75 mL 4  . azelastine (OPTIVAR) 0.05 % ophthalmic solution Place 1  drop into both eyes daily.    . cyclobenzaprine (FLEXERIL) 10 MG tablet Take 1 tablet by mouth every 8 (eight) hours as needed.    . cycloSPORINE (RESTASIS) 0.05 % ophthalmic emulsion Place 1 drop into both eyes 2 (two) times daily.    . diclofenac sodium (VOLTAREN) 1 % GEL APPLY 2 GRAMS EXTERNALLY TO THE AFFECTED AREA FOUR TIMES DAILY (Patient taking differently: Apply 2 g topically 4 (four) times daily. ) 100 g 0  . diltiazem (CARDIZEM CD) 120 MG 24 hr capsule Take 1 capsule (120 mg total) by mouth daily. Please keep upcoming appt in January before anymore refills. Final Attempt 90 capsule 3  . famotidine (PEPCID) 20 MG tablet Take 1 tablet (20 mg total) by mouth daily.    . flecainide (TAMBOCOR) 100 MG tablet Take 1 tablet (100 mg total) by mouth 2 (two) times daily. Please keep upcoming appt in January before anymore refills. Final Attempt (Patient taking differently: Take 100 mg by mouth 2 (two) times daily. ) 180 tablet 3  . fluticasone (FLONASE) 50 MCG/ACT nasal spray Place 2 sprays into both nostrils every morning.    . hydrocortisone (ANUSOL-HC) 25 MG suppository Place 1 suppository (25 mg total) rectally 2 (two) times daily. 12 suppository 0  . hydrOXYzine (ATARAX/VISTARIL) 25 MG tablet Take 25 mg by mouth 2 (two) times daily as needed for anxiety.    Marland Kitchen loratadine (CLARITIN) 10 MG tablet Take 10 mg by mouth daily.    . magnesium oxide (MAG-OX) 400 (241.3 Mg) MG tablet Take 1 tablet (400 mg total) by mouth 2 (two) times daily.    . metoprolol tartrate (LOPRESSOR) 25 MG tablet Take 1 tablet (25 mg total) by mouth 2 (two) times daily.    . OXYGEN 2L at bedtime and prn exertion    . pantoprazole (PROTONIX) 40 MG tablet Take 1 tablet (40 mg total) by mouth 2 (two) times daily.    . potassium chloride SA (K-DUR,KLOR-CON) 20 MEQ tablet Take 2 tablets (40 mEq total) by mouth 2 (two) times daily.    . rivaroxaban (XARELTO) 20 MG TABS tablet TAKE 1 TABLET(20 MG) BY MOUTH DAILY AFTER SUPPER 90 tablet  3  . traMADol (ULTRAM) 50 MG tablet Take 1 tablet by mouth every 8 (eight) hours as needed.    . venlafaxine XR (EFFEXOR-XR) 150 MG 24 hr capsule Take 150 mg by mouth daily with breakfast.    . VENTOLIN HFA 108 (90 Base) MCG/ACT inhaler INHALE 2 PUFFS INTO THE LUNGS EVERY 6 HOURS AS NEEDED FOR WHEEZING OR SHORTNESS OF BREATH 18 g 0  . witch hazel-glycerin (TUCKS) pad Apply topically as needed for itching. 40 each 12   No current facility-administered medications for this visit.      Discharge Medications: Please see discharge summary for a list of discharge medications.  Relevant Imaging Results:  Relevant Lab Results:   Additional Information SS# [id number]  [name], BSW, MSW, [place]  Licensed Clinical Social Worker  [place]  Mailing [place]. [address] [place], [place] [address] Physical Address-[address], [place], [place]  [place] Main # [phone] Fax # [phone] Cell # [phone]  Office # [phone] [name]

## 2018-11-30 NOTE — Telephone Encounter (Signed)
Care Coordination  Telephone Outreach Note 11/30/2018 Name: SHERRINE SALBERG MRN: [id number] DOB: February 21, 1956 Patient's social work needs are being met by [facility] social worker Product manager.  This LCSW is available for care coordination with [name] as needed.  [name], [facility]   [phone] 11:31 AM

## 2018-11-30 NOTE — NC FL2 (Addendum)
[place] LEVEL OF CARE SCREENING TOOL     IDENTIFICATION  Patient Name: [name] White Birthdate: [birth year] Sex: female Admission Date (Current Location): (Not on file)  [place] and [place] Number:  [name] [id number] [place] and Address:  The [place]. [facility], [place] [address] [place], [place] [address]      Provider Number: [id number]  Attending Physician Name and Address:  Dr. [name] [address] Relative Name and Phone Number:  Rowan Blase (Sister) # [phone]     Current Level of Care: Home Recommended Level of Care: [place] Prior Approval Number:    Date Approved/Denied:   PASRR Number:    Discharge Plan: SNF    Current Diagnoses: Patient Active Problem List   Diagnosis Date Noted  . Exposure to Covid-19 Virus 11/02/2018  . Reactive airway disease 09/10/2018  . Shortness of breath 09/09/2018  . Acute bronchitis 08/29/2018  . Acute on chronic respiratory failure with hypoxia ([place]) 08/29/2018  . Sleep apnea 07/05/2018  . Chronic respiratory failure with hypoxia ([place]) 07/05/2018  . Osteoarthritis 03/02/2018  . Viral illness 02/13/2018  . Nausea 02/13/2018  . Left medial tibial stress syndrome 12/26/2017  . Hematoma 11/27/2017  . Severe episode of recurrent major depressive disorder, without psychotic features ([place])   . MDD (major depressive disorder), recurrent episode, severe ([place]) 09/07/2015  . Atrial flutter ([place]) 07/29/2015  . History of atrial flutter 06/26/2015  . Chest pain 03/22/2015  . Asthma 11/12/2014  . History of hypertension 11/05/2014  . Atrial fibrillation, chronic 11/03/2014  . Chronic anticoagulation 11/03/2014  . History of pulmonary embolus (PE) 11/03/2014  . Non-traumatic rhabdomyolysis 11/03/2014  . Major depressive disorder 11/03/2014  . SOB (shortness of breath)   . MDD (major depressive disorder), recurrent severe, without psychosis ([place]) 03/05/2014  .  Pulmonary embolism ([place]) 12/27/2013  . Vocal cord dysfunction 11/12/2013  . Mixed headache 01/06/2012  . OVERACTIVE BLADDER 04/18/2008  . Osteoarthritis of both knees 04/18/2008  . POLYARTHRITIS 09/14/2007  . Morbid obesity ([place]) 08/24/2006  . RESTLESS LEGS SYNDROME 08/24/2006  . HYPERTENSION, BENIGN SYSTEMIC 08/24/2006  . RHINITIS, ALLERGIC 08/24/2006  . GASTROESOPHAGEAL REFLUX, NO ESOPHAGITIS 08/24/2006    Orientation RESPIRATION BLADDER Height & Weight     Self, Time, Situation, Place  O2(2 Liters per nasal cannula during exercertion and while asleep.) Incontinent Weight:  319lb Height:   5'5"  BEHAVIORAL SYMPTOMS/MOOD NEUROLOGICAL BOWEL NUTRITION STATUS      Continent Diet(Heart healthy.)  AMBULATORY STATUS COMMUNICATION OF NEEDS Skin   Limited Assist(Requires wheelchair for long distances.) Verbally Normal                       Personal Care Assistance Level of Assistance  Bathing, Dressing Bathing Assistance: Limited assistance   Dressing Assistance: Limited assistance     Functional Limitations Info  Sight(Reading glasses.) Sight Info: Impaired        SPECIAL CARE FACTORS FREQUENCY  PT (By licensed PT), OT (By licensed OT)     PT Frequency: 5 times per week OT Frequency: 5 times per week            Contractures Contractures Info: Not present    Additional Factors Info  Code Status, Allergies Code Status Info: full code Allergies Info: Caffeine; Vicodin hydrocodone-acetaminophen ; Hydralazine Hcl ; Hydrocodone-acetaminophen ;Ciprofloxacin ; Ciprofloxacin ; Erythromycin; Lisinopril ; Oxycodone; Sulfamethoxazole-trimethoprim(Caffeine; Vicodin hydrocodone-acetaminophen ; Hydralazine Hcl ; Hydrocodone-acetaminophen ;Ciprofloxacin ; Ciprofloxacin ; Erythromycin; Lisinopril ; Oxycodone;  Sulfamethoxazole-trimethoprim )           Current Medications (11/30/2018):  This is the current active medication list Current Outpatient Medications  Medication Sig  Dispense Refill  . acetaminophen (TYLENOL) 325 MG tablet Take 2 tablets (650 mg total) by mouth every 6 (six) hours as needed for mild pain.    Marland Kitchen albuterol (PROVENTIL) (2.5 MG/3ML) 0.083% nebulizer solution Take 3 mLs (2.5 mg total) by nebulization every 6 (six) hours as needed for wheezing or shortness of breath. 75 mL 4  . azelastine (OPTIVAR) 0.05 % ophthalmic solution Place 1 drop into both eyes daily.    . cyclobenzaprine (FLEXERIL) 10 MG tablet Take 1 tablet by mouth every 8 (eight) hours as needed.    . cycloSPORINE (RESTASIS) 0.05 % ophthalmic emulsion Place 1 drop into both eyes 2 (two) times daily.    . diclofenac sodium (VOLTAREN) 1 % GEL APPLY 2 GRAMS EXTERNALLY TO THE AFFECTED AREA FOUR TIMES DAILY (Patient taking differently: Apply 2 g topically 4 (four) times daily. ) 100 g 0  . diltiazem (CARDIZEM CD) 120 MG 24 hr capsule Take 1 capsule (120 mg total) by mouth daily. Please keep upcoming appt in January before anymore refills. Final Attempt 90 capsule 3  . famotidine (PEPCID) 20 MG tablet Take 1 tablet (20 mg total) by mouth daily.    . flecainide (TAMBOCOR) 100 MG tablet Take 1 tablet (100 mg total) by mouth 2 (two) times daily. Please keep upcoming appt in January before anymore refills. Final Attempt (Patient taking differently: Take 100 mg by mouth 2 (two) times daily. ) 180 tablet 3  . fluticasone (FLONASE) 50 MCG/ACT nasal spray Place 2 sprays into both nostrils every morning.    . hydrocortisone (ANUSOL-HC) 25 MG suppository Place 1 suppository (25 mg total) rectally 2 (two) times daily. 12 suppository 0  . hydrOXYzine (ATARAX/VISTARIL) 25 MG tablet Take 25 mg by mouth 2 (two) times daily as needed for anxiety.    Marland Kitchen loratadine (CLARITIN) 10 MG tablet Take 10 mg by mouth daily.    . magnesium oxide (MAG-OX) 400 (241.3 Mg) MG tablet Take 1 tablet (400 mg total) by mouth 2 (two) times daily.    . metoprolol tartrate (LOPRESSOR) 25 MG tablet Take 1 tablet (25 mg total) by mouth 2  (two) times daily.    . OXYGEN 2L at bedtime and prn exertion    . pantoprazole (PROTONIX) 40 MG tablet Take 1 tablet (40 mg total) by mouth 2 (two) times daily.    . potassium chloride SA (K-DUR,KLOR-CON) 20 MEQ tablet Take 2 tablets (40 mEq total) by mouth 2 (two) times daily.    . rivaroxaban (XARELTO) 20 MG TABS tablet TAKE 1 TABLET(20 MG) BY MOUTH DAILY AFTER SUPPER 90 tablet 3  . traMADol (ULTRAM) 50 MG tablet Take 1 tablet by mouth every 8 (eight) hours as needed.    . venlafaxine XR (EFFEXOR-XR) 150 MG 24 hr capsule Take 150 mg by mouth daily with breakfast.    . VENTOLIN HFA 108 (90 Base) MCG/ACT inhaler INHALE 2 PUFFS INTO THE LUNGS EVERY 6 HOURS AS NEEDED FOR WHEEZING OR SHORTNESS OF BREATH 18 g 0  . witch hazel-glycerin (TUCKS) pad Apply topically as needed for itching. 40 each 12   No current facility-administered medications for this visit.      Discharge Medications: .  Relevant Imaging Results:  Relevant Lab Results:   Additional Information SS# [id number]  Maurine Cane, LCSW ------------------------------------------------------------------- I agree with  the documentation above and co-sign this document  [name] MD PGY-2 Family Medicine Resident

## 2018-12-02 ENCOUNTER — Other Ambulatory Visit: Payer: Self-pay | Admitting: Family Medicine

## 2018-12-02 ENCOUNTER — Other Ambulatory Visit: Payer: Self-pay | Admitting: Internal Medicine

## 2018-12-02 DIAGNOSIS — I471 Supraventricular tachycardia: Secondary | ICD-10-CM

## 2018-12-03 ENCOUNTER — Ambulatory Visit: Payer: Self-pay | Admitting: Pharmacist

## 2018-12-03 ENCOUNTER — Other Ambulatory Visit: Payer: Self-pay | Admitting: *Deleted

## 2018-12-03 ENCOUNTER — Telehealth: Payer: Self-pay | Admitting: Internal Medicine

## 2018-12-03 ENCOUNTER — Telehealth: Payer: Self-pay

## 2018-12-03 ENCOUNTER — Other Ambulatory Visit: Payer: Medicare HMO

## 2018-12-03 ENCOUNTER — Ambulatory Visit: Payer: Self-pay

## 2018-12-03 DIAGNOSIS — Z20822 Contact with and (suspected) exposure to covid-19: Secondary | ICD-10-CM

## 2018-12-03 NOTE — Telephone Encounter (Signed)
No symptoms- [facility] [address]. Pt with COPD and afib, HTN Order done and pt scheduled for 12/04/18 at 3:45 pm. Pt informed to wear mask and to stay in car for testing.

## 2018-12-03 NOTE — Patient Outreach (Signed)
[facility]) Care Management  12/03/2018  [name] White 04/17/1956 [id number]   Care coordination   Patient called [facility] RN CM Patient is able to verify HIPAA Consent: [facility] RN CM reviewed [facility] services with patient. Patient gave verbal consent for services.  [name] states she was informed by [facility] health care the need to get a COVID 19 test done 3 days prior to admission She called and was able to get testing scheduled for 12/03/08 at 1545 and wanted to see if she could get assist with transportation [place] completed a conference call with [name] and [facility] SW about this. [name] was encouraged to pend her testing until the [facility] was completed by Dr Kris Mouton. J Saporito, [facility] SW left a message for at return call requesting the completion of the [facility] Ms Neighbors will be contacted by [facility] when [facility] process completed so pt can scheduled transportation for testing with use of [employer]  [facility] RN CM provided [name] the contact number for [employer] as [phone]  Plan Collaborate with pt and [facility] SW prn   Kizzy Olafson L. Lavina Hamman, RN, BSN, [place] Coordinator Office number [phone] Mobile number [phone]  Main [facility] number [phone] Fax number [phone]

## 2018-12-03 NOTE — Telephone Encounter (Signed)
Patient called in to cancel appointment Cancel for Covid-19 testing for tomorrow. Patient will call back to reschedule.

## 2018-12-03 NOTE — Telephone Encounter (Signed)
Patient called in to cancel appointment for Covid -19 testing. For  12/04/18+  Will call back to reschedule appointmenT

## 2018-12-03 NOTE — Patient Outreach (Signed)
[name] White [facility]) Care Management  12/03/2018  JOYANNE EDDINGER [birth year] [id number]   CSW received a 3-way call from Jackelyn Poling, [facility] with [place] Management, with patient on the line.  HIPAA compliant identifiers were confirmed.  [name] explained to [place] that patient has scheduled an appointment for tomorrow, Tuesday, December 04, 2018 at 3:45 PM to have a COVID-19 Screening performed at [place].  [name] reported that patient does not have transportation arranged to this appointment, indicating that she was planning to provide patient with the contact information for [employer] through [employer]. CSW encouraged [name] and/or patient to contact CSW directly if [employer] is not able to transport her, as CSW will need to make alternate arrangements.  In the meantime, CSW explained to patient and [name] that CSW is still waiting for patient's Primary Care Physician, Dr. [name] to review and sign patient's FL-2 Form in order for CSW to be able to fax it to Elizabeth Sauer, Admissions Director at [facility].  CSW is concerned that patient will have the COVID-19 Screening performed prematurely, as patient will need to be able to provide [facility] with negative COVID-19 results within 3 days of screening results.  CSW has left HIPAA compliant messages for Dr. Kris Mouton and his Nurse, Corky Sox and is currently awaiting a return call.  CSW received a return call from [name] with patient's PASSAR information - approval date:  09/12/2018; passar #:  [id number]; termination date:  10/12/2018.  [name] agreed to try and obtain skilled nursing admission approval from [facility], as well as request recent PASSAR approval number for patient through [facility].  CSW will continue to try and make contact with Dr. Kris Mouton and Mrs.  [place] to request review and signature of patient's FL-2 Form in Epic.  Once FL-2 Form is signed, CSW will fax to [name] at [facility] for review and possible placement.  CSW will follow-up with patient again on Tuesday, December 04, 2018, around 11:00AM, just to ensure that patient is aware of the current plan of care.  [name], BSW, MSW, LCSW  Licensed Education officer, [facility]  Mailing [place] [place], [place] [address] Physical Address-[address], [place], [place] [address] Toll Free Main # [phone] Fax # [phone] Cell # [phone]  Office # [phone] [name]

## 2018-12-04 ENCOUNTER — Other Ambulatory Visit: Payer: Self-pay | Admitting: *Deleted

## 2018-12-04 ENCOUNTER — Ambulatory Visit: Payer: Self-pay | Admitting: *Deleted

## 2018-12-04 ENCOUNTER — Other Ambulatory Visit: Payer: Medicare HMO

## 2018-12-04 DIAGNOSIS — I10 Essential (primary) hypertension: Secondary | ICD-10-CM | POA: Diagnosis not present

## 2018-12-04 DIAGNOSIS — J9611 Chronic respiratory failure with hypoxia: Secondary | ICD-10-CM | POA: Diagnosis not present

## 2018-12-04 DIAGNOSIS — I4892 Unspecified atrial flutter: Secondary | ICD-10-CM | POA: Diagnosis not present

## 2018-12-04 DIAGNOSIS — G40909 Epilepsy, unspecified, not intractable, without status epilepticus: Secondary | ICD-10-CM | POA: Diagnosis not present

## 2018-12-04 DIAGNOSIS — M549 Dorsalgia, unspecified: Secondary | ICD-10-CM | POA: Diagnosis not present

## 2018-12-04 DIAGNOSIS — G8929 Other chronic pain: Secondary | ICD-10-CM | POA: Diagnosis not present

## 2018-12-04 DIAGNOSIS — Z5189 Encounter for other specified aftercare: Secondary | ICD-10-CM | POA: Insufficient documentation

## 2018-12-04 DIAGNOSIS — J441 Chronic obstructive pulmonary disease with (acute) exacerbation: Secondary | ICD-10-CM | POA: Diagnosis not present

## 2018-12-04 DIAGNOSIS — M170 Bilateral primary osteoarthritis of knee: Secondary | ICD-10-CM | POA: Diagnosis not present

## 2018-12-04 DIAGNOSIS — I482 Chronic atrial fibrillation, unspecified: Secondary | ICD-10-CM | POA: Diagnosis not present

## 2018-12-04 NOTE — Progress Notes (Signed)
[place] Telemedicine Visit  Patient consented to have virtual visit. Method of visit: Telephone  Encounter participants: Patient: [name] White - located at home Provider: [name] - located at [facility]mc Others (if applicable): none  Chief Complaint: progressing weakness  HPI: 63 year old female who has had trouble with ADLs at home, feels progressive weakness, and wishes to go back to SNF.  Briefly, patient was admitted to [facility] back in April.  Due to her extended stay there and deconditioning she was sent to a skilled nursing facility where she resided until mid May.  He states that since getting out she has had periods of profound weakness, which do limit her ADLs and feels that she would benefit from going back to SNF in order to continue to get rehab.  Patient states that she is also had a good amount of shortness of breath while standing and moving around.  She has not noticed any wheezing per se but does feel that her breathing has been affected.  She has not been using her albuterol for COPD, and does not take any controller medications.  CSW has received FL2 form, which has been cosigned by this provider.  ROS: per HPI  Pertinent PMHx: copd, deconditioning for prolonged hospitalization  Exam:  General: No acute distress, comfortable on phone, very pleasant Respiratory: No audible wheezing on phone, no shortness of breath, no distress, able to make fully formed and coherent sentences with no issue Psych: Very pleasant, appropriate, coherent thought process  Assessment/Plan:  Encounter for rehabilitation FL-2 form completed and cosigned.  Will likely need cotesting prior to readmittance back to SNF, which is been arranged.  Shortness of breath Intermittent, with activity, worse at night.  Patient feels like to slightly consistent with a COPD exacerbation.  She was currently nonlabored and in no distress, fine.  Encouraged her to  use her albuterol inhaler more often this will likely help with symptoms of her COPD.  She is not on any controller medications, these are managed by pulmonology.  Did encourage follow-up with them for further management.    Time spent during visit with patient: 14 minutes

## 2018-12-04 NOTE — Assessment & Plan Note (Signed)
Intermittent, with activity, worse at night.  Patient feels like to slightly consistent with a COPD exacerbation.  She was currently nonlabored and in no distress, fine.  Encouraged her to use her albuterol inhaler more often this will likely help with symptoms of her COPD.  She is not on any controller medications, these are managed by pulmonology.  Did encourage follow-up with them for further management.

## 2018-12-04 NOTE — Assessment & Plan Note (Signed)
FL-2 form completed and cosigned.  Will likely need cotesting prior to readmittance back to SNF, which is been arranged.

## 2018-12-04 NOTE — Telephone Encounter (Signed)
Likely 2/2 increased albuterol use that I encouraged patient to do during my telephone visit. Will monitor at subsequent visits.  [name] MD PGY-2 Family Medicine Resident

## 2018-12-04 NOTE — Patient Outreach (Signed)
[facility]) Care Management  12/04/2018  [name] White 06/09/1956 [id number]   CSW was able to make contact with patient today to follow-up regarding short-term placement arrangements for patient in a skilled nursing facility to receive rehabilitative services.  CSW explained to patient that CSW received patient's completed and signed consent form and that it has been scanned into patient's chart.  CSW went on to explain to patient that CSW was able to obtain the completed and signed FL-2 Form from patient's Primary Care Physician, Dr. [name] and that CSW went ahead and faxed the FL-2 Form to Elizabeth Sauer, Admissions Director at [facility], [place], for review.    Patient reported that her [place] with [facility], Daiva Eves is planning to fax CSW's all of her physical therapy notes later today, as [name] reported that she really wanted to be able to include today's note for insurance purposes.  Once CSW receives [name]'s notes, CSW will fax them to [name] so that she can try and obtain admission authorization through [facility].  CSW noted that patient cancelled her COVID-19 Screening, scheduled for 3:45PM today (Tuesday, December 04, 2018), per CSW's request.  CSW wants to ensure that [facility] is willing to accept patient into their facility before having patient perform this task.  [name] indicated that she would need to review all of patient's information (FL-2 Form, demographics, medication list, physical therapy notes, etc.) with her supervisors before being able to make a bed offer.  [name] agreed to contact CSW directly, as soon as this meeting has taken place and a decision has been.  Patient is aware of all of the above information and knows that CSW will follow-up with her as soon as an answer is received.  Patient will then reschedule  her COVID-19 Screening.  [name] White, BSW, MSW, LCSW  Licensed Education officer, environmental Health System  Mailing [place] [address] [place], [place] [address] Physical Address-[address], [place], [place] [address] Toll Free Main # [phone] Fax # [phone] Cell # [phone]  Office # [phone] [name]

## 2018-12-06 DIAGNOSIS — M549 Dorsalgia, unspecified: Secondary | ICD-10-CM | POA: Diagnosis not present

## 2018-12-06 DIAGNOSIS — I10 Essential (primary) hypertension: Secondary | ICD-10-CM | POA: Diagnosis not present

## 2018-12-06 DIAGNOSIS — I4892 Unspecified atrial flutter: Secondary | ICD-10-CM | POA: Diagnosis not present

## 2018-12-06 DIAGNOSIS — G40909 Epilepsy, unspecified, not intractable, without status epilepticus: Secondary | ICD-10-CM | POA: Diagnosis not present

## 2018-12-06 DIAGNOSIS — I482 Chronic atrial fibrillation, unspecified: Secondary | ICD-10-CM | POA: Diagnosis not present

## 2018-12-06 DIAGNOSIS — J441 Chronic obstructive pulmonary disease with (acute) exacerbation: Secondary | ICD-10-CM | POA: Diagnosis not present

## 2018-12-06 DIAGNOSIS — G8929 Other chronic pain: Secondary | ICD-10-CM | POA: Diagnosis not present

## 2018-12-06 DIAGNOSIS — J9611 Chronic respiratory failure with hypoxia: Secondary | ICD-10-CM | POA: Diagnosis not present

## 2018-12-06 DIAGNOSIS — M170 Bilateral primary osteoarthritis of knee: Secondary | ICD-10-CM | POA: Diagnosis not present

## 2018-12-07 ENCOUNTER — Other Ambulatory Visit: Payer: Self-pay | Admitting: Family Medicine

## 2018-12-07 ENCOUNTER — Telehealth: Payer: Self-pay | Admitting: Internal Medicine

## 2018-12-07 NOTE — Telephone Encounter (Signed)
New message    [name] White states that her heart rate is 112-118 at rest. When patient walks to bathroom her heart rate is 130-140. Please call.

## 2018-12-07 NOTE — Telephone Encounter (Signed)
lpmtcb 6/12

## 2018-12-07 NOTE — Telephone Encounter (Signed)
New Message ° ° °Patient returning your call. °

## 2018-12-07 NOTE — Telephone Encounter (Signed)
I left message for Tillie Rung [phone] from [facility] in regards to patient's HR.

## 2018-12-07 NOTE — Telephone Encounter (Signed)
I spoke with the patient who called because she is concerned about her elevated HR lately.  When she awoke this morning, it was 154 and then gradually came down to the lower 100s.    She has been taking all of her medications, except for Metoprolol (none past 3 days, waiting to fill prescription).  She will be back in rehab early next week [employer]) and they monitor/control her HR.  She will resume her Metoprolol tomorrow and call us Monday with HR update.

## 2018-12-08 DIAGNOSIS — J9611 Chronic respiratory failure with hypoxia: Secondary | ICD-10-CM | POA: Diagnosis not present

## 2018-12-11 DIAGNOSIS — J9611 Chronic respiratory failure with hypoxia: Secondary | ICD-10-CM | POA: Diagnosis not present

## 2018-12-11 DIAGNOSIS — M170 Bilateral primary osteoarthritis of knee: Secondary | ICD-10-CM | POA: Diagnosis not present

## 2018-12-11 DIAGNOSIS — G8929 Other chronic pain: Secondary | ICD-10-CM | POA: Diagnosis not present

## 2018-12-11 DIAGNOSIS — M549 Dorsalgia, unspecified: Secondary | ICD-10-CM | POA: Diagnosis not present

## 2018-12-11 DIAGNOSIS — G40909 Epilepsy, unspecified, not intractable, without status epilepticus: Secondary | ICD-10-CM | POA: Diagnosis not present

## 2018-12-11 DIAGNOSIS — J441 Chronic obstructive pulmonary disease with (acute) exacerbation: Secondary | ICD-10-CM | POA: Diagnosis not present

## 2018-12-11 DIAGNOSIS — I4892 Unspecified atrial flutter: Secondary | ICD-10-CM | POA: Diagnosis not present

## 2018-12-11 DIAGNOSIS — I10 Essential (primary) hypertension: Secondary | ICD-10-CM | POA: Diagnosis not present

## 2018-12-11 DIAGNOSIS — I482 Chronic atrial fibrillation, unspecified: Secondary | ICD-10-CM | POA: Diagnosis not present

## 2018-12-12 ENCOUNTER — Telehealth: Payer: Self-pay | Admitting: Internal Medicine

## 2018-12-12 ENCOUNTER — Encounter: Payer: Self-pay | Admitting: *Deleted

## 2018-12-12 ENCOUNTER — Other Ambulatory Visit: Payer: Self-pay | Admitting: *Deleted

## 2018-12-12 NOTE — Telephone Encounter (Signed)
New Message:    Janelle from [facility] called to let you know she saw pt yesterday at her home. Her Heart rate was 110 and pt said she had called the office. Janelle would like to know if pt is coming I for an office visit or will you just continue to monitor? A nurse will continue to see her for once a week for 2 weeks for Disease Management, Medication Management and Monitor Heart Rate.

## 2018-12-12 NOTE — Telephone Encounter (Signed)
Returned call to [place] with [facility].  Advised that when Pt called office last week she was out of her metoprolol.  Advised Janell to ensure Pt is taking:  Metoprolol tartrate 25  Mg bid  Flecainide 100 mg BID  Diltiazem CD 120 mg daily    Janell will confirm and call this nurse back for further advisement

## 2018-12-12 NOTE — Patient Outreach (Signed)
[facility]) Care Management  12/12/2018  [name] White [birth year] [id number]   CSW was able to make contact with patient today to follow-up regarding short-term placement into a skilled nursing facility to receive rehabilitative services.  CSW explained to patient that CSW finally received physical therapy notes from patient's [place] Physical Therapist, [name] White with [facility], and have faxed all notes to [name] White, Admissions Director at [facility] for review.  Patient's physical therapy notes were needed to determine eligibility through [facility]  CSW also followed up with [name] to ensure that she received the faxed physical therapy notes that CSW faxed on Monday, December 10, 2018, and to check the status of patient's potential placement into [facility].  [name] reported that she has presented all of patient's information (FL-2 Form, medication list, demographics, physical therapy notes, etc.) to her superiors and they are still refusing to accept any new residents from home.  [name] encouraged patient to be hospitalized, but patient does not have medical necessity or meet medical criteria.   [name] went on to explain that [name] White only has two physical therapy beds available and they have already been assigned to two new residents, admitted from the hospital, that must be quarantined in a private room for 14 days before being able to start rehabilitative services and moved into a rehab bed.  [name] was extremely apologetic, but reminded CSW that placing patient from home could be problematic.  [name] admitted that she does not know when COVID-19 restrictions will be lifted from their facility.  Patient was disappointed to hear that she would not be able to receive placement at [facility], but voiced understanding.  Patient then  admitted to [place] that she is currently working with two additional social workers, one with [place], Insurance underwriter and one with [place] [facility] [place].  Patient also indicated that she has already submitted an application to the [employer] for emergency housing and that [name] is trying to help expedite the application process.  CSW will perform a case closure on patient, as all goals of treatment have been met from social work standpoint and no additional social work needs have been identified at this time.  CSW will notify patient's Pharmacist with [place], [name] White of CSW's plans to close patient's case.  CSW will fax an update to patient's Primary Care Physician, Dr. Guadalupe White to ensure that they are aware of CSW's involvement with patient's plan of care.  CSW was able to confirm that patient has the correct contact information for CSW.     [name] White, BSW, MSW, LCSW  Licensed Education officer, environmental Health System  Mailing [place] [address] [place], [place] [address] Physical Address-[address], [place],  [address] Toll Free Main # [phone] Fax # [phone] Cell # [phone]  Office # [phone] [name]

## 2018-12-12 NOTE — Telephone Encounter (Signed)
[name] White returned your call, she can reached [phone]

## 2018-12-14 DIAGNOSIS — M549 Dorsalgia, unspecified: Secondary | ICD-10-CM | POA: Diagnosis not present

## 2018-12-14 DIAGNOSIS — J9611 Chronic respiratory failure with hypoxia: Secondary | ICD-10-CM | POA: Diagnosis not present

## 2018-12-14 DIAGNOSIS — I482 Chronic atrial fibrillation, unspecified: Secondary | ICD-10-CM | POA: Diagnosis not present

## 2018-12-14 DIAGNOSIS — I10 Essential (primary) hypertension: Secondary | ICD-10-CM | POA: Diagnosis not present

## 2018-12-14 DIAGNOSIS — J441 Chronic obstructive pulmonary disease with (acute) exacerbation: Secondary | ICD-10-CM | POA: Diagnosis not present

## 2018-12-14 DIAGNOSIS — G8929 Other chronic pain: Secondary | ICD-10-CM | POA: Diagnosis not present

## 2018-12-14 DIAGNOSIS — G40909 Epilepsy, unspecified, not intractable, without status epilepticus: Secondary | ICD-10-CM | POA: Diagnosis not present

## 2018-12-14 DIAGNOSIS — I4892 Unspecified atrial flutter: Secondary | ICD-10-CM | POA: Diagnosis not present

## 2018-12-14 DIAGNOSIS — M170 Bilateral primary osteoarthritis of knee: Secondary | ICD-10-CM | POA: Diagnosis not present

## 2018-12-18 NOTE — Telephone Encounter (Signed)
Left detailed message for Pt.  Advised per Dr. Lovena Le he would need to see her in the office to discuss possible tx change for elevated heart rate.  Advised scheduling would contact Pt to schedule follow up.

## 2018-12-20 ENCOUNTER — Other Ambulatory Visit: Payer: Self-pay

## 2018-12-20 ENCOUNTER — Emergency Department (HOSPITAL_COMMUNITY)
Admission: EM | Admit: 2018-12-20 | Discharge: 2018-12-20 | Disposition: A | Discharge status: Home / Self Care | Payer: Medicare HMO | Attending: Emergency Medicine | Admitting: Emergency Medicine

## 2018-12-20 ENCOUNTER — Emergency Department (HOSPITAL_COMMUNITY): Payer: Medicare HMO

## 2018-12-20 DIAGNOSIS — R0600 Dyspnea, unspecified: Secondary | ICD-10-CM | POA: Diagnosis present

## 2018-12-20 DIAGNOSIS — Z79899 Other long term (current) drug therapy: Secondary | ICD-10-CM | POA: Diagnosis not present

## 2018-12-20 DIAGNOSIS — R0902 Hypoxemia: Secondary | ICD-10-CM | POA: Diagnosis not present

## 2018-12-20 DIAGNOSIS — Z87891 Personal history of nicotine dependence: Secondary | ICD-10-CM | POA: Insufficient documentation

## 2018-12-20 DIAGNOSIS — Z20828 Contact with and (suspected) exposure to other viral communicable diseases: Secondary | ICD-10-CM | POA: Insufficient documentation

## 2018-12-20 DIAGNOSIS — I10 Essential (primary) hypertension: Secondary | ICD-10-CM | POA: Diagnosis not present

## 2018-12-20 DIAGNOSIS — R0602 Shortness of breath: Secondary | ICD-10-CM | POA: Diagnosis not present

## 2018-12-20 DIAGNOSIS — J441 Chronic obstructive pulmonary disease with (acute) exacerbation: Secondary | ICD-10-CM | POA: Diagnosis not present

## 2018-12-20 DIAGNOSIS — R000 Tachycardia, unspecified: Secondary | ICD-10-CM | POA: Diagnosis not present

## 2018-12-20 LAB — CBC WITH DIFFERENTIAL/PLATELET
Abs Immature Granulocytes: 0.03 10*3/uL (ref 0.00–0.07)
Basophils Absolute: 0.1 10*3/uL (ref 0.0–0.1)
Basophils Relative: 1 %
Eosinophils Absolute: 0.2 10*3/uL (ref 0.0–0.5)
Eosinophils Relative: 2 %
HCT: 40.4 % (ref 36.0–46.0)
Hemoglobin: 12.1 g/dL (ref 12.0–15.0)
Immature Granulocytes: 0 %
Lymphocytes Relative: 19 %
Lymphs Abs: 2 10*3/uL (ref 0.7–4.0)
MCH: 28 pg (ref 26.0–34.0)
MCHC: 30 g/dL (ref 30.0–36.0)
MCV: 93.5 fL (ref 80.0–100.0)
Monocytes Absolute: 0.8 10*3/uL (ref 0.1–1.0)
Monocytes Relative: 7 %
Neutro Abs: 7.8 10*3/uL — ABNORMAL HIGH (ref 1.7–7.7)
Neutrophils Relative %: 71 %
Platelets: 486 10*3/uL — ABNORMAL HIGH (ref 150–400)
RBC: 4.32 MIL/uL (ref 3.87–5.11)
RDW: 16.3 % — ABNORMAL HIGH (ref 11.5–15.5)
WBC: 11 10*3/uL — ABNORMAL HIGH (ref 4.0–10.5)
nRBC: 0 % (ref 0.0–0.2)

## 2018-12-20 LAB — BRAIN NATRIURETIC PEPTIDE: B Natriuretic Peptide: 27.8 pg/mL (ref 0.0–100.0)

## 2018-12-20 LAB — BASIC METABOLIC PANEL
Anion gap: 8 (ref 5–15)
BUN: 11 mg/dL (ref 8–23)
CO2: 24 mmol/L (ref 22–32)
Calcium: 8.6 mg/dL — ABNORMAL LOW (ref 8.9–10.3)
Chloride: 105 mmol/L (ref 98–111)
Creatinine, Ser: 0.78 mg/dL (ref 0.44–1.00)
GFR calc Af Amer: >60 mL/min (ref >60)
GFR calc non Af Amer: >60 mL/min (ref >60)
Glucose, Bld: 105 mg/dL — ABNORMAL HIGH (ref 70–99)
Potassium: 4.3 mmol/L (ref 3.5–5.1)
Sodium: 137 mmol/L (ref 135–145)

## 2018-12-20 LAB — SARS CORONAVIRUS 2 BY RT PCR (HOSPITAL ORDER, PERFORMED IN ~~LOC~~ HOSPITAL LAB): SARS Coronavirus 2: NEGATIVE

## 2018-12-20 MED ORDER — ALBUTEROL SULFATE HFA 108 (90 BASE) MCG/ACT IN AERS
2.0000 | INHALATION_SPRAY | Freq: Once | RESPIRATORY_TRACT | Status: AC
  Administered 2018-12-20: 2 via RESPIRATORY_TRACT
  Filled 2018-12-20: qty 6.7

## 2018-12-20 MED ORDER — DEXAMETHASONE 4 MG PO TABS
8.0000 mg | ORAL_TABLET | Freq: Once | ORAL | Status: AC
  Administered 2018-12-20: 8 mg via ORAL
  Filled 2018-12-20: qty 2

## 2018-12-20 MED ORDER — PREDNISONE 20 MG PO TABS: 40.0000 mg | ORAL_TABLET | Freq: Every day | ORAL | 0 refills | Status: DC

## 2018-12-20 NOTE — ED Triage Notes (Signed)
Per EMS: Pt reports to the ED with c/o COPD exacerbation and hx of the same.  Pt is staying at a hotel to isolate to keep herself away from others. Pt reports she had a negative covid test May 4th.  Pt is alert and oriented and EMS reports they gave her EPI as they could not get an IV on her.

## 2018-12-20 NOTE — ED Notes (Signed)
Bed: IX65 Expected date:  Expected time:  Means of arrival:  Comments: EMS-COPD

## 2018-12-20 NOTE — ED Provider Notes (Signed)
[facility] DEPT Provider Note   CSN: [id number] Arrival date & time: 12/20/18  1206     History   Chief Complaint Chief Complaint  Patient presents with   COPD    HPI [name] White is a 63 y.o. female.     HPI   40yF with dyspnea. Hx of COPD. Breathing worsening over the past few days. Coughing. No fever or chills. Says she has been self isolating and reports she had negative COVID testing on 5/4. No unusual leg pain or swelling. Hx of copd on home o2.   Past Medical History:  Diagnosis Date   Anxiety    Asthma    Chronic lower back pain    Depression    Family history of anesthesia complication    "daughter; causes her to pass out afterwards"   GERD (gastroesophageal reflux disease)    Hypertension    Hyperthyroidism    Migraine    "monthly" (12/28/2013)   Osteoarthritis    "both knees; back of my neck; right pelvic bone" (12/28/2013)   Paroxysmal A-fib ([place])    Pulmonary embolism ([place]) 12/28/2013   "2 clots in each lung"    Patient Active Problem List   Diagnosis Date Noted   Encounter for rehabilitation 12/04/2018   Exposure to Covid-19 Virus 11/02/2018   Reactive airway disease 09/10/2018   Shortness of breath 09/09/2018   Acute bronchitis 08/29/2018   Acute on chronic respiratory failure with hypoxia ([place]) 08/29/2018   Sleep apnea 07/05/2018   Chronic respiratory failure with hypoxia ([place]) 07/05/2018   Osteoarthritis 03/02/2018   Viral illness 02/13/2018   Nausea 02/13/2018   Left medial tibial stress syndrome 12/26/2017   Hematoma 11/27/2017   Severe episode of recurrent major depressive disorder, without psychotic features ([place])    MDD (major depressive disorder), recurrent episode, severe ([place]) 09/07/2015   Atrial flutter ([place]) 07/29/2015   History of atrial flutter 06/26/2015   Chest pain 03/22/2015   Asthma 11/12/2014   History of hypertension 11/05/2014   Atrial fibrillation,  chronic 11/03/2014   Chronic anticoagulation 11/03/2014   History of pulmonary embolus (PE) 11/03/2014   Non-traumatic rhabdomyolysis 11/03/2014   Major depressive disorder 11/03/2014   SOB (shortness of breath)    MDD (major depressive disorder), recurrent severe, without psychosis ([place]) 03/05/2014   Pulmonary embolism ([place]) 12/27/2013   Vocal cord dysfunction 11/12/2013   Mixed headache 01/06/2012   OVERACTIVE BLADDER 04/18/2008   Osteoarthritis of both knees 04/18/2008   POLYARTHRITIS 09/14/2007   Morbid obesity ([place]) 08/24/2006   RESTLESS LEGS SYNDROME 08/24/2006   HYPERTENSION, BENIGN SYSTEMIC 08/24/2006   RHINITIS, ALLERGIC 08/24/2006   GASTROESOPHAGEAL REFLUX, NO ESOPHAGITIS 08/24/2006    Past Surgical History:  Procedure Laterality Date   ABDOMINAL HYSTERECTOMY     APPENDECTOMY     BREAST CYST EXCISION Right    DILATION AND CURETTAGE OF UTERUS     ELECTROPHYSIOLOGIC STUDY N/A 05/27/2015   Procedure: A-Flutter;  Surgeon: Evans Lance, MD;  Location: [facility];  Service: Cardiovascular;  Laterality: N/A;   EXCISIONAL HEMORRHOIDECTOMY     NASAL SINUS SURGERY  2007   THYROIDECTOMY, PARTIAL Right 2005   TUBAL LIGATION     WISDOM TOOTH EXTRACTION       OB History   No obstetric history on file.      Home Medications    Prior to Admission medications   Medication Sig Start Date End Date Taking? Authorizing Provider  acetaminophen (TYLENOL) 325 MG  tablet Take 2 tablets (650 mg total) by mouth every 6 (six) hours as needed for mild pain. 09/11/18   Georgette Shell, MD  albuterol (PROVENTIL) (2.5 MG/3ML) 0.083% nebulizer solution Take 3 mLs (2.5 mg total) by nebulization every 6 (six) hours as needed for wheezing or shortness of breath. 07/09/18   Collene Gobble, MD  azelastine (OPTIVAR) 0.05 % ophthalmic solution Place 1 drop into both eyes daily. 08/02/18   [provider]  cyclobenzaprine (FLEXERIL) 10 MG tablet TAKE 1  TABLET BY MOUTH EVERY 8 HOURS AS NEEDED FOR PAIN 12/03/18   [name], MD  cycloSPORINE (RESTASIS) 0.05 % ophthalmic emulsion Place 1 drop into both eyes 2 (two) times daily.    [provider]  diclofenac sodium (VOLTAREN) 1 % GEL APPLY 2 GRAMS EXTERNALLY TO THE AFFECTED AREA FOUR TIMES DAILY Patient taking differently: Apply 2 g topically 4 (four) times daily.  07/31/18   [name], MD  diltiazem (CARDIZEM CD) 120 MG 24 hr capsule Take 1 capsule (120 mg total) by mouth daily. Please keep upcoming appt in January before anymore refills. Final Attempt 07/04/18   Patsey Berthold, NP  famotidine (PEPCID) 20 MG tablet TAKE 1 TABLET BY MOUTH ONCE A DAY 12/03/18   [name], MD  flecainide (TAMBOCOR) 100 MG tablet TAKE 1 TABLET BY MOUTH 2 TIMES A DAY. PLEASE KEEP UPCOMING APPOINTMENT IN JANUARY BEFORE ANYMORE REFILLS. 12/03/18   Patsey Berthold, NP  fluticasone (FLONASE) 50 MCG/ACT nasal spray Place 2 sprays into both nostrils every morning. 10/29/18   [provider]  hydrocortisone (ANUSOL-HC) 25 MG suppository Place 1 suppository (25 mg total) rectally 2 (two) times daily. 08/20/18   [place] Donnamarie Poag, DO  hydrOXYzine (ATARAX/VISTARIL) 25 MG tablet Take 25 mg by mouth 2 (two) times daily as needed for anxiety.    [provider]  loratadine (CLARITIN) 10 MG tablet TAKE 1 TABLET BY MOUTH ONE TIME A DAY FOR ALLERGIES 12/03/18   [name], MD  MAGNESIUM-OXIDE 400 (241.3 Mg) MG tablet TAKE 1 TABLET BY MOUTH TWICE DAILY 12/07/18   [name], MD  metoprolol tartrate (LOPRESSOR) 25 MG tablet Take 1 tablet (25 mg total) by mouth 2 (two) times daily. 08/20/18   [place] Donnamarie Poag, DO  OXYGEN 2L at bedtime and prn exertion    [provider]  pantoprazole (PROTONIX) 40 MG tablet TAKE 1 TABLET BY MOUTH TWICE DAILY 12/07/18   [name], MD  Potassium Chloride ER 20 MEQ TBCR TAKE 2 TABLETS BY MOUTH TWICE DAILY RELATED TO ESSENTIAL(PRIMARY) HYPERTENSION 12/03/18    [name], MD  potassium chloride SA (K-DUR,KLOR-CON) 20 MEQ tablet Take 2 tablets (40 mEq total) by mouth 2 (two) times daily. 08/20/18   [place] Donnamarie Poag, DO  traMADol (ULTRAM) 50 MG tablet Take 1 tablet by mouth every 8 (eight) hours as needed. 10/29/18   [provider]  venlafaxine XR (EFFEXOR-XR) 150 MG 24 hr capsule Take 150 mg by mouth daily with breakfast.    [provider]  VENTOLIN HFA 108 (90 Base) MCG/ACT inhaler INHALE 2 PUFFS INTO THE LUNGS EVERY 6 HOURS AS NEEDED FOR WHEEZING OR SHORTNESS OF BREATH 10/18/18   [name], MD  witch hazel-glycerin (TUCKS) pad Apply topically as needed for itching. 08/20/18   [place] Eric J, DO  XARELTO 20 MG TABS tablet TAKE 1 TABLET(20 MG) BY MOUTH DAILY AFTER SUPPER 12/03/18   Patsey Berthold, NP    Family History Family History  Problem Relation Age of Onset   Osteoarthritis Mother    Asthma Mother    Heart failure Mother    Breast cancer Daughter     Social History Social History   Tobacco Use   Smoking status: Former Smoker    Packs/day: 0.25    Years: 20.00    Pack years: 5.00    Types: Cigarettes    Quit date: 07/17/1999    Years since quitting: 19.4   Smokeless tobacco: Never Used  Substance Use Topics   Alcohol use: Not Currently    Comment: "drank some in my 20's"   Drug use: No     Allergies   Caffeine, Vicodin [hydrocodone-acetaminophen], Hydralazine hcl, Hydrocodone-acetaminophen, Ciprofloxacin, Erythromycin, Lisinopril, Oxycodone, and Sulfamethoxazole-trimethoprim   Review of Systems Review of Systems  All systems reviewed and negative, other than as noted in HPI.  Physical Exam Updated Vital Signs BP 136/75    Pulse (!) 103    Temp 98 F (36.7 C) (Oral)    Resp 17    SpO2 100%   Physical Exam Vitals signs and nursing note reviewed.  Constitutional:      General: She is not in acute distress.    Appearance: She is well-developed. She is obese.  HENT:     Head:  Normocephalic and atraumatic.  Eyes:     General:        Right eye: No discharge.        Left eye: No discharge.     Conjunctiva/sclera: Conjunctivae normal.  Neck:     Musculoskeletal: Neck supple.  Cardiovascular:     Rate and Rhythm: Normal rate and regular rhythm.     Heart sounds: Normal heart sounds. No murmur. No friction rub. No gallop.   Pulmonary:     Effort: Pulmonary effort is normal. No respiratory distress.     Breath sounds: Wheezing present.  Abdominal:     General: There is no distension.     Palpations: Abdomen is soft.     Tenderness: There is no abdominal tenderness.  Musculoskeletal:        General: No tenderness.  Skin:    General: Skin is warm and dry.  Neurological:     Mental Status: She is alert.  Psychiatric:        Behavior: Behavior normal.        Thought Content: Thought content normal.      ED Treatments / Results  Labs (all labs ordered are listed, but only abnormal results are displayed) Labs Reviewed  CBC WITH DIFFERENTIAL/PLATELET - Abnormal; Notable for the following components:      Result Value   WBC 11.0 (*)    RDW 16.3 (*)    Platelets 486 (*)    Neutro Abs 7.8 (*)    All other components within normal limits  BASIC METABOLIC PANEL - Abnormal; Notable for the following components:   Glucose, Bld 105 (*)    Calcium 8.6 (*)    All other components within normal limits  SARS CORONAVIRUS 2 (HOSPITAL ORDER, [facility])  BRAIN NATRIURETIC PEPTIDE    EKG EKG Interpretation  Date/Time:  Thursday December 20 2018 12:20:49 EDT Ventricular Rate:  99 PR Interval:    QRS Duration: 96 QT Interval:  346 QTC Calculation: 444 R Axis:   19 Text Interpretation:  Sinus rhythm Prolonged PR interval Confirmed by Virgel Manifold [phone]) on 12/20/2018 1:22:23 PM   Radiology Dg Chest Portable 1 View  Result Date:  12/20/2018 CLINICAL DATA:  Shortness of breath. EXAM: PORTABLE CHEST 1 VIEW COMPARISON:  CT chest dated  September 10, 2018. Chest x-ray dated September 09, 2018. FINDINGS: The heart size and mediastinal contours are within normal limits. Atherosclerotic calcification of the aortic arch. Normal pulmonary vascularity. Low lung volumes with mild bibasilar atelectasis. No focal consolidation, pleural effusion, or pneumothorax. No acute osseous abnormality. IMPRESSION: Low lung volumes.  No active disease. Electronically Signed   By: Titus Dubin M.D.   On: 12/20/2018 15:07    Procedures Procedures (including critical care time)  Medications Ordered in ED Medications - No data to display   Initial Impression / Assessment and Plan / ED Course  I have reviewed the triage vital signs and the nursing notes.  Pertinent labs & imaging results that were available during my care of the patient were reviewed by me and considered in my medical decision making (see chart for details).     62yF with dyspnea. I suspect COPD exacerbation. Afebrile. CXR w/o focal infiltrate. Some wheezing on exam. covid negative. o2 sats are good on RA. Will place on steroids.   [name] White was evaluated in Emergency Department on 12/20/2018 for the symptoms described in the history of present illness. She was evaluated in the context of the global COVID-19 pandemic, which necessitated consideration that the patient might be at risk for infection with the SARS-CoV-2 virus that causes COVID-19. Institutional protocols and algorithms that pertain to the evaluation of patients at risk for COVID-19 are in a state of rapid change based on information released by regulatory bodies including the CDC and federal and state organizations. These policies and algorithms were followed during the patient's care in the ED.   Final Clinical Impressions(s) / ED Diagnoses   Final diagnoses:  COPD exacerbation [facility])    ED Discharge Orders    None       Virgel Manifold, MD 12/23/18 1246

## 2018-12-21 ENCOUNTER — Other Ambulatory Visit: Payer: Self-pay | Admitting: Family Medicine

## 2018-12-21 DIAGNOSIS — M549 Dorsalgia, unspecified: Secondary | ICD-10-CM | POA: Diagnosis not present

## 2018-12-21 DIAGNOSIS — I482 Chronic atrial fibrillation, unspecified: Secondary | ICD-10-CM | POA: Diagnosis not present

## 2018-12-21 DIAGNOSIS — M170 Bilateral primary osteoarthritis of knee: Secondary | ICD-10-CM | POA: Diagnosis not present

## 2018-12-21 DIAGNOSIS — I10 Essential (primary) hypertension: Secondary | ICD-10-CM | POA: Diagnosis not present

## 2018-12-21 DIAGNOSIS — J441 Chronic obstructive pulmonary disease with (acute) exacerbation: Secondary | ICD-10-CM | POA: Diagnosis not present

## 2018-12-21 DIAGNOSIS — G40909 Epilepsy, unspecified, not intractable, without status epilepticus: Secondary | ICD-10-CM | POA: Diagnosis not present

## 2018-12-21 DIAGNOSIS — G8929 Other chronic pain: Secondary | ICD-10-CM | POA: Diagnosis not present

## 2018-12-21 DIAGNOSIS — J9611 Chronic respiratory failure with hypoxia: Secondary | ICD-10-CM | POA: Diagnosis not present

## 2018-12-21 DIAGNOSIS — I4892 Unspecified atrial flutter: Secondary | ICD-10-CM | POA: Diagnosis not present

## 2018-12-26 ENCOUNTER — Emergency Department (HOSPITAL_COMMUNITY): Payer: Medicare HMO

## 2018-12-26 ENCOUNTER — Encounter (HOSPITAL_COMMUNITY): Payer: Self-pay | Admitting: Emergency Medicine

## 2018-12-26 ENCOUNTER — Other Ambulatory Visit: Payer: Self-pay

## 2018-12-26 ENCOUNTER — Emergency Department (HOSPITAL_COMMUNITY)
Admission: EM | Admit: 2018-12-26 | Discharge: 2019-01-09 | Disposition: A | Discharge status: Home / Self Care | Payer: Medicare HMO | Attending: Emergency Medicine | Admitting: Emergency Medicine

## 2018-12-26 DIAGNOSIS — J449 Chronic obstructive pulmonary disease, unspecified: Secondary | ICD-10-CM | POA: Insufficient documentation

## 2018-12-26 DIAGNOSIS — J45909 Unspecified asthma, uncomplicated: Secondary | ICD-10-CM | POA: Diagnosis not present

## 2018-12-26 DIAGNOSIS — F329 Major depressive disorder, single episode, unspecified: Secondary | ICD-10-CM | POA: Diagnosis not present

## 2018-12-26 DIAGNOSIS — Z5900 Homelessness unspecified: Secondary | ICD-10-CM

## 2018-12-26 DIAGNOSIS — Z87891 Personal history of nicotine dependence: Secondary | ICD-10-CM | POA: Insufficient documentation

## 2018-12-26 DIAGNOSIS — F32A Depression, unspecified: Secondary | ICD-10-CM

## 2018-12-26 DIAGNOSIS — Z20828 Contact with and (suspected) exposure to other viral communicable diseases: Secondary | ICD-10-CM | POA: Insufficient documentation

## 2018-12-26 DIAGNOSIS — R079 Chest pain, unspecified: Secondary | ICD-10-CM | POA: Insufficient documentation

## 2018-12-26 DIAGNOSIS — I10 Essential (primary) hypertension: Secondary | ICD-10-CM | POA: Insufficient documentation

## 2018-12-26 DIAGNOSIS — T887XXA Unspecified adverse effect of drug or medicament, initial encounter: Secondary | ICD-10-CM | POA: Diagnosis not present

## 2018-12-26 DIAGNOSIS — Z79899 Other long term (current) drug therapy: Secondary | ICD-10-CM | POA: Diagnosis not present

## 2018-12-26 DIAGNOSIS — R0600 Dyspnea, unspecified: Secondary | ICD-10-CM

## 2018-12-26 DIAGNOSIS — R000 Tachycardia, unspecified: Secondary | ICD-10-CM | POA: Diagnosis not present

## 2018-12-26 DIAGNOSIS — F332 Major depressive disorder, recurrent severe without psychotic features: Secondary | ICD-10-CM | POA: Diagnosis present

## 2018-12-26 DIAGNOSIS — Z7901 Long term (current) use of anticoagulants: Secondary | ICD-10-CM | POA: Diagnosis not present

## 2018-12-26 DIAGNOSIS — R0602 Shortness of breath: Secondary | ICD-10-CM | POA: Diagnosis not present

## 2018-12-26 DIAGNOSIS — F29 Unspecified psychosis not due to a substance or known physiological condition: Secondary | ICD-10-CM | POA: Diagnosis not present

## 2018-12-26 HISTORY — DX: Chronic obstructive pulmonary disease, unspecified: J44.9

## 2018-12-26 LAB — COMPREHENSIVE METABOLIC PANEL
ALT: 15 U/L (ref 0–44)
AST: 16 U/L (ref 15–41)
Albumin: 3.1 g/dL — ABNORMAL LOW (ref 3.5–5.0)
Alkaline Phosphatase: 103 U/L (ref 38–126)
Anion gap: 9 (ref 5–15)
BUN: 11 mg/dL (ref 8–23)
CO2: 27 mmol/L (ref 22–32)
Calcium: 9 mg/dL (ref 8.9–10.3)
Chloride: 105 mmol/L (ref 98–111)
Creatinine, Ser: 0.75 mg/dL (ref 0.44–1.00)
GFR calc Af Amer: >60 mL/min (ref >60)
GFR calc non Af Amer: >60 mL/min (ref >60)
Glucose, Bld: 109 mg/dL — ABNORMAL HIGH (ref 70–99)
Potassium: 3.6 mmol/L (ref 3.5–5.1)
Sodium: 141 mmol/L (ref 135–145)
Total Bilirubin: 0.3 mg/dL (ref 0.3–1.2)
Total Protein: 7 g/dL (ref 6.5–8.1)

## 2018-12-26 LAB — CBC WITH DIFFERENTIAL/PLATELET
Abs Immature Granulocytes: 0.06 10*3/uL (ref 0.00–0.07)
Basophils Absolute: 0 10*3/uL (ref 0.0–0.1)
Basophils Relative: 0 %
Eosinophils Absolute: 0.3 10*3/uL (ref 0.0–0.5)
Eosinophils Relative: 3 %
HCT: 40.5 % (ref 36.0–46.0)
Hemoglobin: 12 g/dL (ref 12.0–15.0)
Immature Granulocytes: 1 %
Lymphocytes Relative: 17 %
Lymphs Abs: 1.8 10*3/uL (ref 0.7–4.0)
MCH: 26.8 pg (ref 26.0–34.0)
MCHC: 29.6 g/dL — ABNORMAL LOW (ref 30.0–36.0)
MCV: 90.4 fL (ref 80.0–100.0)
Monocytes Absolute: 0.7 10*3/uL (ref 0.1–1.0)
Monocytes Relative: 7 %
Neutro Abs: 7.4 10*3/uL (ref 1.7–7.7)
Neutrophils Relative %: 72 %
Platelets: 498 10*3/uL — ABNORMAL HIGH (ref 150–400)
RBC: 4.48 MIL/uL (ref 3.87–5.11)
RDW: 16.3 % — ABNORMAL HIGH (ref 11.5–15.5)
WBC: 10.2 10*3/uL (ref 4.0–10.5)
nRBC: 0 % (ref 0.0–0.2)

## 2018-12-26 LAB — D-DIMER, QUANTITATIVE: D-Dimer, Quant: 1.33 ug/mL-FEU — ABNORMAL HIGH (ref 0.00–0.50)

## 2018-12-26 LAB — POCT I-STAT EG7
Acid-Base Excess: 3 mmol/L — ABNORMAL HIGH (ref 0.0–2.0)
Bicarbonate: 27.5 mmol/L (ref 20.0–28.0)
Calcium, Ion: 1.14 mmol/L — ABNORMAL LOW (ref 1.15–1.40)
HCT: 38 % (ref 36.0–46.0)
Hemoglobin: 12.9 g/dL (ref 12.0–15.0)
O2 Saturation: 99 %
Potassium: 3.5 mmol/L (ref 3.5–5.1)
Sodium: 141 mmol/L (ref 135–145)
TCO2: 29 mmol/L (ref 22–32)
pCO2, Ven: 39.8 mmHg — ABNORMAL LOW (ref 44.0–60.0)
pH, Ven: 7.447 — ABNORMAL HIGH (ref 7.250–7.430)
pO2, Ven: 133 mmHg — ABNORMAL HIGH (ref 32.0–45.0)

## 2018-12-26 LAB — TROPONIN I (HIGH SENSITIVITY)
Troponin I (High Sensitivity): 8 ng/L (ref <18)
Troponin I (High Sensitivity): 9 ng/L (ref <18)

## 2018-12-26 LAB — SARS CORONAVIRUS 2 BY RT PCR (HOSPITAL ORDER, PERFORMED IN ~~LOC~~ HOSPITAL LAB): SARS Coronavirus 2: NEGATIVE

## 2018-12-26 MED ORDER — METOPROLOL TARTRATE 25 MG PO TABS
25.0000 mg | ORAL_TABLET | Freq: Two times a day (BID) | ORAL | Status: DC
  Administered 2018-12-26 – 2019-01-09 (×25): 25 mg via ORAL
  Filled 2018-12-26 (×28): qty 1

## 2018-12-26 MED ORDER — FLUTICASONE PROPIONATE 50 MCG/ACT NA SUSP
2.0000 | Freq: Every morning | NASAL | Status: DC
  Administered 2018-12-29 – 2019-01-09 (×11): 2 via NASAL
  Filled 2018-12-26 (×2): qty 16

## 2018-12-26 MED ORDER — DICLOFENAC SODIUM 1 % TD GEL
2.0000 g | Freq: Four times a day (QID) | TRANSDERMAL | Status: DC | PRN
  Administered 2018-12-29 – 2019-01-02 (×2): 2 g via TOPICAL
  Filled 2018-12-26 (×2): qty 100

## 2018-12-26 MED ORDER — HYDROCORTISONE ACETATE 25 MG RE SUPP
25.0000 mg | Freq: Two times a day (BID) | RECTAL | Status: DC | PRN
  Filled 2018-12-26: qty 1

## 2018-12-26 MED ORDER — HYDROXYZINE PAMOATE 25 MG PO CAPS
25.0000 mg | ORAL_CAPSULE | Freq: Two times a day (BID) | ORAL | Status: DC | PRN
  Filled 2018-12-26: qty 1

## 2018-12-26 MED ORDER — CYCLOBENZAPRINE HCL 10 MG PO TABS
10.0000 mg | ORAL_TABLET | Freq: Three times a day (TID) | ORAL | Status: DC | PRN
  Administered 2018-12-29 – 2019-01-09 (×7): 10 mg via ORAL
  Filled 2018-12-26 (×10): qty 1

## 2018-12-26 MED ORDER — DILTIAZEM HCL ER COATED BEADS 120 MG PO CP24
120.0000 mg | ORAL_CAPSULE | Freq: Every day | ORAL | Status: DC
  Administered 2018-12-26 – 2019-01-09 (×14): 120 mg via ORAL
  Filled 2018-12-26 (×15): qty 1

## 2018-12-26 MED ORDER — CYCLOSPORINE 0.05 % OP EMUL
1.0000 [drp] | Freq: Two times a day (BID) | OPHTHALMIC | Status: DC
  Administered 2018-12-26 – 2019-01-09 (×25): 1 [drp] via OPHTHALMIC
  Filled 2018-12-26 (×31): qty 30

## 2018-12-26 MED ORDER — IOHEXOL 350 MG/ML SOLN
100.0000 mL | Freq: Once | INTRAVENOUS | Status: AC | PRN
  Administered 2018-12-26: 15:00:00 100 mL via INTRAVENOUS

## 2018-12-26 MED ORDER — ALBUTEROL SULFATE (2.5 MG/3ML) 0.083% IN NEBU: 2.5000 mg | INHALATION_SOLUTION | Freq: Four times a day (QID) | RESPIRATORY_TRACT | Status: DC | PRN

## 2018-12-26 MED ORDER — LORATADINE 10 MG PO TABS
10.0000 mg | ORAL_TABLET | Freq: Every day | ORAL | Status: DC
  Administered 2018-12-26 – 2019-01-09 (×15): 10 mg via ORAL
  Filled 2018-12-26 (×15): qty 1

## 2018-12-26 MED ORDER — MAGNESIUM OXIDE 400 (241.3 MG) MG PO TABS
400.0000 mg | ORAL_TABLET | Freq: Two times a day (BID) | ORAL | Status: DC
  Administered 2018-12-26 – 2019-01-09 (×28): 400 mg via ORAL
  Filled 2018-12-26 (×28): qty 1

## 2018-12-26 MED ORDER — ACETAMINOPHEN 325 MG PO TABS
650.0000 mg | ORAL_TABLET | ORAL | Status: DC | PRN
  Administered 2018-12-27 – 2019-01-08 (×20): 650 mg via ORAL
  Filled 2018-12-26 (×20): qty 2

## 2018-12-26 MED ORDER — FAMOTIDINE 20 MG PO TABS
20.0000 mg | ORAL_TABLET | Freq: Every day | ORAL | Status: DC
  Administered 2018-12-26 – 2019-01-09 (×15): 20 mg via ORAL
  Filled 2018-12-26 (×15): qty 1

## 2018-12-26 MED ORDER — RIVAROXABAN 20 MG PO TABS
20.0000 mg | ORAL_TABLET | Freq: Every day | ORAL | Status: DC
  Administered 2018-12-26 – 2019-01-08 (×14): 20 mg via ORAL
  Filled 2018-12-26 (×15): qty 1

## 2018-12-26 MED ORDER — VENLAFAXINE HCL ER 150 MG PO CP24
150.0000 mg | ORAL_CAPSULE | Freq: Every day | ORAL | Status: DC
  Administered 2018-12-27 – 2019-01-09 (×14): 150 mg via ORAL
  Filled 2018-12-26 (×15): qty 1

## 2018-12-26 MED ORDER — FLECAINIDE ACETATE 100 MG PO TABS
100.0000 mg | ORAL_TABLET | Freq: Two times a day (BID) | ORAL | Status: DC
  Administered 2018-12-26 – 2019-01-09 (×24): 100 mg via ORAL
  Filled 2018-12-26 (×32): qty 1

## 2018-12-26 NOTE — ED Notes (Signed)
Per Dr Jeanell Sparrow pt's precautions may be d/cd

## 2018-12-26 NOTE — ED Provider Notes (Signed)
[facility] DEPARTMENT Provider Note   CSN: [id number] Arrival date & time: 12/26/18  1141     History   Chief Complaint Chief Complaint  Patient presents with  . Atrial Fibrillation  . Shortness of Breath    HPI [name] White is a 63 y.o. female.     HPI  63 year old female history of COPD, pulmonary embolism, paroxysmal atrial fibrillation, hypertension, morbid obesity, on home oxygen presents today via EMS with reports of increased dyspnea.  She states that she became short of breath last week with increased coughing.  She was seen at [facility] long hospital at that time and started steroids.  She has completed those.  She has continued to be short of breath.  Today her dyspnea worsened with simultaneous tightening across her anterior chest.  Denies, fever, cough, nausea, vomiting, chills.  Dyspnea is worse with exertion but not lying flat.  She endorses that she has had some ongoing increased frequency of urination.  This occurred approximately 1 hour prior to admission.  She was tested for COVID last week and it was negative.  She denies any other exposures.  [place] [facility] [address] to avoid exposures to family members that she has previously living with.  She states she is waiting for placement in assisted living.  She endorses that she has more swelling on the left leg than the right leg.  She does not remember where her blood clots originated is not clear whether or not she has had a DVT in the past.  She is on Xarelto.  Denies She also complains of suicidality.  She states she is very anxious around the COVID 19 pandemic. Past Medical History:  Diagnosis Date  . Anxiety   . Asthma   . Chronic lower back pain   . COPD (chronic obstructive pulmonary disease) ([place])   . Depression   . Family history of anesthesia complication    "daughter; causes her to pass out afterwards"  . GERD (gastroesophageal reflux disease)   . Hypertension   .  Hyperthyroidism   . Migraine    "monthly" (12/28/2013)  . Osteoarthritis    "both knees; back of my neck; right pelvic bone" (12/28/2013)  . Paroxysmal A-fib ([place])   . Pulmonary embolism ([place]) 12/28/2013   "2 clots in each lung"    Patient Active Problem List   Diagnosis Date Noted  . Encounter for rehabilitation 12/04/2018  . Exposure to Covid-19 Virus 11/02/2018  . Reactive airway disease 09/10/2018  . Shortness of breath 09/09/2018  . Acute bronchitis 08/29/2018  . Acute on chronic respiratory failure with hypoxia ([place]) 08/29/2018  . Sleep apnea 07/05/2018  . Chronic respiratory failure with hypoxia ([place]) 07/05/2018  . Osteoarthritis 03/02/2018  . Viral illness 02/13/2018  . Nausea 02/13/2018  . Left medial tibial stress syndrome 12/26/2017  . Hematoma 11/27/2017  . Severe episode of recurrent major depressive disorder, without psychotic features ([place])   . MDD (major depressive disorder), recurrent episode, severe ([place]) 09/07/2015  . Atrial flutter ([place]) 07/29/2015  . History of atrial flutter 06/26/2015  . Chest pain 03/22/2015  . Asthma 11/12/2014  . History of hypertension 11/05/2014  . Atrial fibrillation, chronic 11/03/2014  . Chronic anticoagulation 11/03/2014  . History of pulmonary embolus (PE) 11/03/2014  . Non-traumatic rhabdomyolysis 11/03/2014  . Major depressive disorder 11/03/2014  . SOB (shortness of breath)   . MDD (major depressive disorder), recurrent severe, without psychosis ([place]) 03/05/2014  . Pulmonary embolism (  [place]) 12/27/2013  . Vocal cord dysfunction 11/12/2013  . Mixed headache 01/06/2012  . OVERACTIVE BLADDER 04/18/2008  . Osteoarthritis of both knees 04/18/2008  . POLYARTHRITIS 09/14/2007  . Morbid obesity ([place]) 08/24/2006  . RESTLESS LEGS SYNDROME 08/24/2006  . HYPERTENSION, BENIGN SYSTEMIC 08/24/2006  . RHINITIS, ALLERGIC 08/24/2006  . GASTROESOPHAGEAL REFLUX, NO ESOPHAGITIS 08/24/2006    Past Surgical History:  Procedure Laterality Date   . ABDOMINAL HYSTERECTOMY    . APPENDECTOMY    . BREAST CYST EXCISION Right   . DILATION AND CURETTAGE OF UTERUS    . ELECTROPHYSIOLOGIC STUDY N/A 05/27/2015   Procedure: A-Flutter;  Surgeon: Evans Lance, MD;  Location: [facility];  Service: Cardiovascular;  Laterality: N/A;  . EXCISIONAL HEMORRHOIDECTOMY    . NASAL SINUS SURGERY  2007  . THYROIDECTOMY, PARTIAL Right 2005  . TUBAL LIGATION    . WISDOM TOOTH EXTRACTION       OB History   No obstetric history on file.      Home Medications    Prior to Admission medications   Medication Sig Start Date End Date Taking? Authorizing Provider  acetaminophen (TYLENOL) 325 MG tablet Take 2 tablets (650 mg total) by mouth every 6 (six) hours as needed for mild pain. 09/11/18   Georgette Shell, MD  albuterol (PROVENTIL) (2.5 MG/3ML) 0.083% nebulizer solution Take 3 mLs (2.5 mg total) by nebulization every 6 (six) hours as needed for wheezing or shortness of breath. 07/09/18   Collene Gobble, MD  azelastine (OPTIVAR) 0.05 % ophthalmic solution Place 1 drop into both eyes 2 (two) times daily as needed (itchy eyes).  08/02/18   [provider]  cyclobenzaprine (FLEXERIL) 10 MG tablet TAKE 1 TABLET BY MOUTH EVERY 8 HOURS AS NEEDED FOR PAIN Patient taking differently: Take 10 mg by mouth every 8 (eight) hours as needed (pain).  12/03/18   [name], MD  cycloSPORINE (RESTASIS) 0.05 % ophthalmic emulsion Place 1 drop into both eyes 2 (two) times daily.    [provider]  diclofenac sodium (VOLTAREN) 1 % GEL APPLY 2 GRAMS EXTERNALLY TO THE AFFECTED AREA FOUR TIMES DAILY Patient taking differently: Apply 2 g topically 4 (four) times daily as needed (pain).  07/31/18   [name], MD  diltiazem (CARDIZEM CD) 120 MG 24 hr capsule Take 1 capsule (120 mg total) by mouth daily. Please keep upcoming appt in January before anymore refills. Final Attempt Patient taking differently: Take 120 mg by mouth at bedtime. Please  keep upcoming appt in January before anymore refills. Final Attempt 07/04/18   Patsey Berthold, NP  famotidine (PEPCID) 20 MG tablet Take 1 tablet (20 mg total) by mouth daily. 12/22/18   [name], MD  flecainide (TAMBOCOR) 100 MG tablet TAKE 1 TABLET BY MOUTH 2 TIMES A DAY. PLEASE KEEP UPCOMING APPOINTMENT IN JANUARY BEFORE ANYMORE REFILLS. Patient taking differently: Take 100 mg by mouth 2 (two) times daily.  12/03/18   Seiler, Amber K, NP  fluticasone (FLONASE) 50 MCG/ACT nasal spray Place 2 sprays into both nostrils every morning. 10/29/18   [provider]  hydrocortisone (ANUSOL-HC) 25 MG suppository Place 1 suppository (25 mg total) rectally 2 (two) times daily. Patient taking differently: Place 25 mg rectally 2 (two) times daily as needed for hemorrhoids or anal itching.  08/20/18   [place] Donnamarie Poag, DO  hydrOXYzine (VISTARIL) 25 MG capsule Take 25 mg by mouth 2 (two) times daily as needed for anxiety. 11/26/18   [provider]  loratadine (CLARITIN) 10 MG tablet Take 1 tablet (10 mg total) by mouth daily. 12/22/18   [name], MD  MAGNESIUM-OXIDE 400 (241.3 Mg) MG tablet TAKE 1 TABLET BY MOUTH TWICE DAILY Patient taking differently: Take 400 mg by mouth 2 (two) times daily.  12/07/18   [name], MD  metoprolol tartrate (LOPRESSOR) 25 MG tablet TAKE 1 TABLET BY MOUTH TWICE DAILY 12/22/18   [name], MD  OXYGEN 2L at bedtime and prn exertion    [provider]  pantoprazole (PROTONIX) 40 MG tablet TAKE 1 TABLET BY MOUTH TWICE DAILY Patient taking differently: Take 40 mg by mouth 2 (two) times daily before a meal.  12/07/18   [name], MD  Potassium Chloride ER 20 MEQ TBCR TAKE 2 TABLETS BY MOUTH TWICE DAILY RELATED TO ESSENTIAL(PRIMARY) HYPERTENSION Patient taking differently: Take 20 mEq by mouth 2 (two) times a day.  12/03/18   [name], MD  predniSONE (DELTASONE) 20 MG tablet Take 2 tablets (40 mg total) by mouth daily. 12/20/18   Virgel Manifold, MD  traMADol (ULTRAM) 50 MG tablet Take 50 mg by mouth every 8 (eight) hours as needed for moderate pain.  10/29/18   [provider]  venlafaxine XR (EFFEXOR-XR) 150 MG 24 hr capsule Take 150 mg by mouth daily with breakfast.    [provider]  VENTOLIN HFA 108 (90 Base) MCG/ACT inhaler INHALE 2 PUFFS INTO THE LUNGS EVERY 6 HOURS AS NEEDED FOR WHEEZING OR SHORTNESS OF BREATH Patient taking differently: Inhale 2 puffs into the lungs every 6 (six) hours as needed for wheezing or shortness of breath.  10/18/18   [name], MD  XARELTO 20 MG TABS tablet TAKE 1 TABLET(20 MG) BY MOUTH DAILY AFTER SUPPER Patient taking differently: Take 20 mg by mouth daily with supper.  12/03/18   Patsey Berthold, NP    Family History Family History  Problem Relation Age of Onset  . Osteoarthritis Mother   . Asthma Mother   . Heart failure Mother   . Breast cancer Daughter     Social History Social History   Tobacco Use  . Smoking status: Former Smoker    Packs/day: 0.25    Years: 20.00    Pack years: 5.00    Types: Cigarettes    Quit date: 07/17/1999    Years since quitting: 19.4  . Smokeless tobacco: Never Used  Substance Use Topics  . Alcohol use: Not Currently    Comment: "drank some in my 30's"  . Drug use: No     Allergies   Caffeine, Hydralazine hcl, Hydrocodone, Ciprofloxacin, Erythromycin, Lisinopril, Oxycodone, and Sulfamethoxazole-trimethoprim   Review of Systems Review of Systems  All other systems reviewed and are negative.    Physical Exam Updated Vital Signs BP 125/90 (BP Location: Right Arm)   Pulse (!) 112   Temp 97.7 F (36.5 C) (Oral)   Resp 20   SpO2 97%   Physical Exam Vitals signs and nursing note reviewed.  Constitutional:      General: She is not in acute distress.    Appearance: She is well-developed. She is obese. She is not ill-appearing.  HENT:     Head: Normocephalic and atraumatic.     Mouth/Throat:     Mouth: Mucous  membranes are moist.     Pharynx: Oropharynx is clear.  Eyes:     Extraocular Movements: Extraocular movements intact.     Pupils: Pupils are equal, round, and reactive to  light.  Neck:     Musculoskeletal: Normal range of motion and neck supple.  Cardiovascular:     Rate and Rhythm: Regular rhythm. Tachycardia present.  Pulmonary:     Effort: Pulmonary effort is normal.     Breath sounds: Normal breath sounds. No decreased breath sounds, wheezing, rhonchi or rales.  Abdominal:     General: Bowel sounds are normal.     Palpations: Abdomen is soft.  Musculoskeletal: Normal range of motion.     Right lower leg: She exhibits no tenderness. No edema.     Left lower leg: She exhibits tenderness. No edema.     Comments: Mild tenderness left calf, but no redness, or swelling noted  Skin:    General: Skin is warm and dry.     Capillary Refill: Capillary refill takes less than 2 seconds.  Neurological:     General: No focal deficit present.     Mental Status: She is alert.  Psychiatric:        Attention and Perception: Attention normal.        Mood and Affect: Mood is depressed.        Speech: Speech normal.        Behavior: Behavior normal.        Thought Content: Thought content does not include suicidal ideation.        Cognition and Memory: Cognition normal.        Judgment: Judgment normal.      ED Treatments / Results  Labs (all labs ordered are listed, but only abnormal results are displayed) Labs Reviewed - No data to display  EKG EKG Interpretation  Date/Time:  Wednesday December 26 2018 11:52:32 EDT Ventricular Rate:  109 PR Interval:    QRS Duration: 83 QT Interval:  325 QTC Calculation: 440 R Axis:   52 Text Interpretation:  Sinus tachycardia rate has increase since last tracing 20 December 2018 Confirmed by Pattricia Boss [phone]) on 12/26/2018 11:58:20 AM   Radiology No results found.  Procedures Procedures (including critical care time)  Medications Ordered in  ED Medications - No data to display   Initial Impression / Assessment and Plan / ED Course  I have reviewed the triage vital signs and the nursing notes.  Pertinent labs & imaging results that were available during my care of the patient were reviewed by me and considered in my medical decision making (see chart for details).        63 yo female co dyspnea and chest tightness with evaluatin here for cad, chf, andpe- all negative and patient with normalized hr and sats 100% on her home oxygen.  Medically appears stable. However, patient states she does not feel safe due to depression, anxiety, and suicidal thoughts. Plan psych evaluation.   Final Clinical Impressions(s) / ED Diagnoses   Final diagnoses:  None    ED Discharge Orders    None       Pattricia Boss, MD 12/26/18 [phone]

## 2018-12-26 NOTE — ED Notes (Signed)
TTS  In process
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
## 2018-12-26 NOTE — ED Notes (Signed)
Pt states she has thought about going to sleep and not waking up but has no clear plan and can identify multiple reasons to live along with having many friends and a good support system.

## 2018-12-26 NOTE — ED Triage Notes (Signed)
Per EMS: Pt from home ([employer]) with c/o SHOB. Pt was seen recent;y for same at [facility] long.  SHOB worse with exertion along with CP that is also worse with exertion.  Pt has a HX of A-Fib, Depression, HTN.  Bilateral lower extremity edema.

## 2018-12-26 NOTE — ED Notes (Signed)
Pt's belongings placed in [place] [facility] not yet inventoried. Arrives in hospital gown on stretcher

## 2018-12-27 DIAGNOSIS — R0600 Dyspnea, unspecified: Secondary | ICD-10-CM | POA: Diagnosis not present

## 2018-12-27 LAB — RAPID URINE DRUG SCREEN, HOSP PERFORMED
Amphetamines: NOT DETECTED
Barbiturates: NOT DETECTED
Benzodiazepines: NOT DETECTED
Cocaine: NOT DETECTED
Opiates: NOT DETECTED
Tetrahydrocannabinol: NOT DETECTED

## 2018-12-27 NOTE — ED Notes (Signed)
[name] White was placed on patient.

## 2018-12-27 NOTE — Progress Notes (Signed)
Pt. meets criteria for inpatient treatment per Lindon Romp, NP.  No appropriate beds available at [facility]. Referred out to the following hospitals:  [facility]        Disposition CSW will continue to follow for placement.  Areatha Keas. Judi Cong, MSW, [place] Disposition Clinical Social Work [phone] (cell) [phone] (office)

## 2018-12-27 NOTE — ED Notes (Signed)
Dinner tray ordered.

## 2018-12-27 NOTE — ED Provider Notes (Signed)
Patient evaluated on morning rounds.  She has no complaints or questions at this time.  Vitals are stable.  TTS recommending inpatient treatment for suicidal thoughts and will seek placement.   Frederica Kuster, PA-C 12/27/18 1235    Lacretia Leigh, MD 12/30/18 [phone]

## 2018-12-28 ENCOUNTER — Inpatient Hospital Stay (HOSPITAL_COMMUNITY): Admission: AD | Admit: 2018-12-28 | Payer: Medicare HMO | Source: Intra-hospital | Admitting: Psychiatry

## 2018-12-28 DIAGNOSIS — R0600 Dyspnea, unspecified: Secondary | ICD-10-CM | POA: Diagnosis not present

## 2018-12-28 MED ORDER — ALBUTEROL SULFATE HFA 108 (90 BASE) MCG/ACT IN AERS
1.0000 | INHALATION_SPRAY | RESPIRATORY_TRACT | Status: AC | PRN
  Administered 2018-12-29: 23:00:00 2 via RESPIRATORY_TRACT
  Filled 2018-12-28 (×2): qty 6.7

## 2018-12-28 NOTE — BHH Counselor (Signed)
Per Williemae Area, RN pt has been accepted to [facility] and assigned to room/bed: 400-1. Pt can come after 0800. Attending physician: Dr. Mallie Darting. Nursing report: [phone]. Updated disposition discussed Candy, RN. COVID test and results to be completed. Fax voluntary paperwork to [phone].   Vertell Novak, [place], [facility], [facility] Triage Specialist [phone]

## 2018-12-28 NOTE — ED Provider Notes (Signed)
Emergency Medicine Observation Re-evaluation Note  [name] White is a 63 y.o. female, seen on rounds today.  Pt initially presented to the ED for complaints of Atrial Fibrillation and Shortness of Breath Currently, the patient is sitting in bed, talking to sitter.  Physical Exam  BP 118/62 (BP Location: Right Wrist)   Pulse 65   Temp 98 F (36.7 C) (Oral)   Resp 20   SpO2 98%  Physical Exam Alert, smiling, conversing without difficulty with sitter. States she feels safe in the hospital. Reports SHOB at times, better after using her albuterol MDI. ED Course / MDM  EKG:EKG Interpretation  Date/Time:  Wednesday December 26 2018 11:52:32 EDT Ventricular Rate:  109 PR Interval:    QRS Duration: 83 QT Interval:  325 QTC Calculation: 440 R Axis:   52 Text Interpretation:  Sinus tachycardia rate has increase since last tracing 20 December 2018 Confirmed by Pattricia Boss [phone]) on 12/26/2018 11:58:20 AM    I have reviewed the labs performed to date as well as medications administered while in observation.  Recent changes in the last 24 hours include use of albuterol MDI for [name] White with relief. Plan  Current plan is for placement at facility capable of managing home O2 and mobility with cane. Will order albuterol MDI PRN. Patient is not under full IVC at this time.   [name], PA-C 12/28/18 [place], [place], DO 12/28/18 1252

## 2018-12-28 NOTE — ED Notes (Signed)
Breakfast tray ordered 

## 2018-12-28 NOTE — ED Notes (Signed)
[facility] called regarding patients bed. Informed that patient is on home oxygen and ambulates with a cane. Patient will not be able to transfer to [facility] due to these restrictions.

## 2018-12-28 NOTE — ED Notes (Signed)
Patient was given a snack and drink. A Diet was called in for Lunch.

## 2018-12-29 DIAGNOSIS — R0600 Dyspnea, unspecified: Secondary | ICD-10-CM | POA: Diagnosis not present

## 2018-12-29 LAB — BASIC METABOLIC PANEL
Anion gap: 7 (ref 5–15)
BUN: 10 mg/dL (ref 8–23)
CO2: 27 mmol/L (ref 22–32)
Calcium: 8.3 mg/dL — ABNORMAL LOW (ref 8.9–10.3)
Chloride: 102 mmol/L (ref 98–111)
Creatinine, Ser: 0.63 mg/dL (ref 0.44–1.00)
GFR calc Af Amer: >60 mL/min (ref >60)
GFR calc non Af Amer: >60 mL/min (ref >60)
Glucose, Bld: 132 mg/dL — ABNORMAL HIGH (ref 70–99)
Potassium: 3.7 mmol/L (ref 3.5–5.1)
Sodium: 136 mmol/L (ref 135–145)

## 2018-12-29 MED ORDER — HYDROXYZINE HCL 25 MG PO TABS
25.0000 mg | ORAL_TABLET | Freq: Two times a day (BID) | ORAL | Status: DC | PRN
  Administered 2018-12-29 – 2019-01-09 (×21): 25 mg via ORAL
  Filled 2018-12-29 (×22): qty 1

## 2018-12-29 NOTE — Progress Notes (Signed)
CSW contacted referral facilities with the following results:  Still reviewing: [facility], [facility], and [facility]  Denied: [facility] (too medically complex) and [facility] (due to need for long term placement)  TTS will continue to seek bed placement.   Chalmers Guest. Guerry Bruin, MSW, [place] Work/Disposition Phone: [phone] Fax: [phone]

## 2018-12-29 NOTE — ED Notes (Signed)
Breakfast tray ordered 

## 2018-12-29 NOTE — ED Provider Notes (Signed)
Patient seen today in daily rounds, 63 year old female awaiting placement.  Patient requests to her home dose of potassium, review of chart shows that patient takes 40 mEq twice daily, she is not on a diuretic.  Patient is not sure why she is on potassium.  Recheck of patient's potassium, within normal limits at 3.7.  Advised patient we will hold off on additional potassium at this point, may consider recheck in a few days.  Patient also requests her usual dose of Vistaril which she takes for anxiety, order given.  No other needs at this time.  Patient is calm, speech is clear, respirations even and unlabored.   [name], PA-C 12/29/18 1310    [name], Gwenyth Allegra, MD 12/29/18 [phone]

## 2018-12-29 NOTE — ED Notes (Addendum)
States she is supposed to be taking Potassium BID. Advised pt will ask APP. States she feels comfortable and safe while in ED. States feels SI d/t Coronavirus and that "I am in the more affected pt population". Pt noted to be calm, cooperative, pleasant. PureWick intact to pt. Pt noted w/O2 n/c removed from nose. States she is "getting too dried out so I take it off for a little while". Pt nad.

## 2018-12-29 NOTE — BH Assessment (Signed)
[place] Assessment Progress Note  Patient was seen for -re-assessment today.  Patient states that she is feeling really sad today because her son who died in 09/25/2009 has a birthday coming up and patient states that this is a hard time of year for her.  Patient states that she continues to have suicidal ideation, but states that is because she is in the hospital and does not have access to things that she could cause harm to herself with.  She states that she is unable to contract for safety outside of the hospital.  TTS continues to recommend inpatient placement.

## 2018-12-29 NOTE — ED Notes (Signed)
Patient received afternoon snack

## 2018-12-30 DIAGNOSIS — Z5900 Homelessness unspecified: Secondary | ICD-10-CM

## 2018-12-30 DIAGNOSIS — F332 Major depressive disorder, recurrent severe without psychotic features: Secondary | ICD-10-CM

## 2018-12-30 DIAGNOSIS — R0600 Dyspnea, unspecified: Secondary | ICD-10-CM | POA: Diagnosis not present

## 2018-12-30 NOTE — ED Notes (Signed)
Re-TTS being performed.  

## 2018-12-30 NOTE — NC FL2 (Signed)
[place] MEDICAID FL2 LEVEL OF CARE SCREENING TOOL     IDENTIFICATION  Patient Name: [name] White Birthdate: 1956-02-17 Sex: female Admission Date (Current Location): 12/26/2018  [place] and [place] Number:  [name] White [id number] [place] and Address:  [facility] [place], [place] [address], [place], [place] [address]      Provider Number: [id number]  Attending Physician Name and Address:  Default, Provider, MD  Relative Name and Phone Number:  Darrol Poke, sister, [phone]    Current Level of Care: Hospital Recommended Level of Care: [place] Prior Approval Number:    Date Approved/Denied:   PASRR Number: Pending manual nurse review  Discharge Plan: SNF    Current Diagnoses: Patient Active Problem List   Diagnosis Date Noted  . Homelessness 12/30/2018  . Encounter for rehabilitation 12/04/2018  . Exposure to Covid-19 Virus 11/02/2018  . Reactive airway disease 09/10/2018  . Shortness of breath 09/09/2018  . Acute bronchitis 08/29/2018  . Acute on chronic respiratory failure with hypoxia ([place]) 08/29/2018  . Sleep apnea 07/05/2018  . Chronic respiratory failure with hypoxia ([place]) 07/05/2018  . Osteoarthritis 03/02/2018  . Viral illness 02/13/2018  . Nausea 02/13/2018  . Left medial tibial stress syndrome 12/26/2017  . Hematoma 11/27/2017  . Severe episode of recurrent major depressive disorder, without psychotic features ([place])   . MDD (major depressive disorder), recurrent episode, severe ([place]) 09/07/2015  . Atrial flutter ([place]) 07/29/2015  . History of atrial flutter 06/26/2015  . Chest pain 03/22/2015  . Asthma 11/12/2014  . History of hypertension 11/05/2014  . Atrial fibrillation, chronic 11/03/2014  . Chronic anticoagulation 11/03/2014  . History of pulmonary embolus (PE) 11/03/2014  . Non-traumatic rhabdomyolysis 11/03/2014  . Major depressive disorder 11/03/2014  . SOB (shortness of breath)   . MDD (major  depressive disorder), recurrent severe, without psychosis ([place]) 03/05/2014  . Pulmonary embolism ([place]) 12/27/2013  . Vocal cord dysfunction 11/12/2013  . Mixed headache 01/06/2012  . OVERACTIVE BLADDER 04/18/2008  . Osteoarthritis of both knees 04/18/2008  . POLYARTHRITIS 09/14/2007  . Morbid obesity ([place]) 08/24/2006  . RESTLESS LEGS SYNDROME 08/24/2006  . HYPERTENSION, BENIGN SYSTEMIC 08/24/2006  . RHINITIS, ALLERGIC 08/24/2006  . GASTROESOPHAGEAL REFLUX, NO ESOPHAGITIS 08/24/2006    Orientation RESPIRATION BLADDER Height & Weight     Self, Situation  O2 Continent Weight:   Height:     BEHAVIORAL SYMPTOMS/MOOD NEUROLOGICAL BOWEL NUTRITION STATUS  Dangerous to self, others or property   Continent    AMBULATORY STATUS COMMUNICATION OF NEEDS Skin   Limited Assist Verbally Normal                       Personal Care Assistance Level of Assistance  Dressing, Feeding, Bathing Bathing Assistance: Limited assistance Feeding assistance: Limited assistance Dressing Assistance: Limited assistance     Functional Limitations Info             SPECIAL CARE FACTORS FREQUENCY  PT (By licensed PT), OT (By licensed OT)     PT Frequency: 5x weekly OT Frequency: 5x weekly            Contractures Contractures Info: Not present    Additional Factors Info  Code Status Code Status Info: Full             Current Medications (12/30/2018):  This is the current hospital active medication list Current Facility-Administered Medications  Medication Dose Route Frequency Provider Last Rate Last Dose  . acetaminophen (TYLENOL) tablet 650 mg  650 mg Oral Q4H PRN Pattricia Boss, MD   650 mg at 12/30/18 0946  . albuterol (VENTOLIN HFA) 108 (90 Base) MCG/ACT inhaler 1-2 puff  1-2 puff Inhalation Q4H PRN Suella Broad A, PA-C   2 puff at 12/29/18 2247  . cyclobenzaprine (FLEXERIL) tablet 10 mg  10 mg Oral Q8H PRN Pattricia Boss, MD   10 mg at 12/29/18 0130  . cycloSPORINE (RESTASIS) 0.05 %  ophthalmic emulsion 1 drop  1 drop Both Eyes BID Pattricia Boss, MD   1 drop at 12/30/18 1031  . diclofenac sodium (VOLTAREN) 1 % transdermal gel 2 g  2 g Topical QID PRN Pattricia Boss, MD   2 g at 12/29/18 1142  . diltiazem (CARDIZEM CD) 24 hr capsule 120 mg  120 mg Oral Daily Pattricia Boss, MD   Stopped at 12/30/18 [phone]  . famotidine (PEPCID) tablet 20 mg  20 mg Oral Daily Pattricia Boss, MD   20 mg at 12/30/18 0942  . flecainide (TAMBOCOR) tablet 100 mg  100 mg Oral BID Pattricia Boss, MD   100 mg at 12/30/18 1031  . fluticasone (FLONASE) 50 MCG/ACT nasal spray 2 spray  2 spray Each Nare q morning - 10a Pattricia Boss, MD   2 spray at 12/30/18 0944  . hydrocortisone (ANUSOL-HC) suppository 25 mg  25 mg Rectal BID PRN Pattricia Boss, MD      . hydrOXYzine (ATARAX/VISTARIL) tablet 25 mg  25 mg Oral BID PRN Rozann Lesches, RPH   25 mg at 12/30/18 7824  . loratadine (CLARITIN) tablet 10 mg  10 mg Oral Daily Pattricia Boss, MD   10 mg at 12/30/18 0942  . magnesium oxide (MAG-OX) tablet 400 mg  400 mg Oral BID Pattricia Boss, MD   400 mg at 12/30/18 0942  . metoprolol tartrate (LOPRESSOR) tablet 25 mg  25 mg Oral BID Pattricia Boss, MD   Stopped at 12/30/18 [phone]  . rivaroxaban (XARELTO) tablet 20 mg  20 mg Oral Q supper Pattricia Boss, MD   20 mg at 12/29/18 1737  . venlafaxine XR (EFFEXOR-XR) 24 hr capsule 150 mg  150 mg Oral Q breakfast Pattricia Boss, MD   150 mg at 12/30/18 6144   Current Outpatient Medications  Medication Sig Dispense Refill  . acetaminophen (TYLENOL) 325 MG tablet Take 2 tablets (650 mg total) by mouth every 6 (six) hours as needed for mild pain.    Marland Kitchen albuterol (PROVENTIL) (2.5 MG/3ML) 0.083% nebulizer solution Take 3 mLs (2.5 mg total) by nebulization every 6 (six) hours as needed for wheezing or shortness of breath. 75 mL 4  . azelastine (OPTIVAR) 0.05 % ophthalmic solution Place 1 drop into both eyes 2 (two) times daily as needed (itchy eyes).     . cyclobenzaprine (FLEXERIL) 10 MG  tablet TAKE 1 TABLET BY MOUTH EVERY 8 HOURS AS NEEDED FOR PAIN (Patient taking differently: Take 10 mg by mouth every 8 (eight) hours as needed (pain). ) 60 tablet 0  . cycloSPORINE (RESTASIS) 0.05 % ophthalmic emulsion Place 1 drop into both eyes 2 (two) times daily.    . diclofenac sodium (VOLTAREN) 1 % GEL APPLY 2 GRAMS EXTERNALLY TO THE AFFECTED AREA FOUR TIMES DAILY (Patient taking differently: Apply 2 g topically 4 (four) times daily as needed (pain). ) 100 g 0  . diltiazem (CARDIZEM CD) 120 MG 24 hr capsule Take 1 capsule (120 mg total) by mouth daily. Please keep upcoming appt in January before anymore refills. Final Attempt (Patient taking differently:  Take 120 mg by mouth at bedtime. Please keep upcoming appt in January before anymore refills. Final Attempt) 90 capsule 3  . famotidine (PEPCID) 20 MG tablet Take 1 tablet (20 mg total) by mouth daily. 90 tablet 0  . flecainide (TAMBOCOR) 100 MG tablet TAKE 1 TABLET BY MOUTH 2 TIMES A DAY. PLEASE KEEP UPCOMING APPOINTMENT IN JANUARY BEFORE ANYMORE REFILLS. (Patient taking differently: Take 100 mg by mouth 2 (two) times daily. ) 60 tablet 6  . fluticasone (FLONASE) 50 MCG/ACT nasal spray Place 2 sprays into both nostrils every morning.    . hydrocortisone (ANUSOL-HC) 25 MG suppository Place 1 suppository (25 mg total) rectally 2 (two) times daily. (Patient taking differently: Place 25 mg rectally 2 (two) times daily as needed for hemorrhoids or anal itching. ) 12 suppository 0  . hydrOXYzine (VISTARIL) 25 MG capsule Take 25 mg by mouth 2 (two) times daily as needed for anxiety.    Marland Kitchen loratadine (CLARITIN) 10 MG tablet Take 1 tablet (10 mg total) by mouth daily. 90 tablet 0  . MAGNESIUM-OXIDE 400 (241.3 Mg) MG tablet TAKE 1 TABLET BY MOUTH TWICE DAILY (Patient taking differently: Take 400 mg by mouth 2 (two) times daily. ) 90 tablet 0  . metoprolol tartrate (LOPRESSOR) 25 MG tablet TAKE 1 TABLET BY MOUTH TWICE DAILY (Patient taking differently: Take  25 mg by mouth 2 (two) times daily. ) 120 tablet 0  . OXYGEN 2L at bedtime and prn exertion    . Potassium Chloride ER 20 MEQ TBCR TAKE 2 TABLETS BY MOUTH TWICE DAILY RELATED TO ESSENTIAL(PRIMARY) HYPERTENSION (Patient taking differently: Take 20 mEq by mouth 2 (two) times a day. ) 120 tablet 0  . traMADol (ULTRAM) 50 MG tablet Take 50 mg by mouth every 8 (eight) hours as needed for moderate pain.     Marland Kitchen venlafaxine XR (EFFEXOR-XR) 150 MG 24 hr capsule Take 150 mg by mouth daily with breakfast.    . VENTOLIN HFA 108 (90 Base) MCG/ACT inhaler INHALE 2 PUFFS INTO THE LUNGS EVERY 6 HOURS AS NEEDED FOR WHEEZING OR SHORTNESS OF BREATH (Patient taking differently: Inhale 2 puffs into the lungs every 6 (six) hours as needed for wheezing or shortness of breath. ) 18 g 0  . XARELTO 20 MG TABS tablet TAKE 1 TABLET(20 MG) BY MOUTH DAILY AFTER SUPPER (Patient taking differently: Take 20 mg by mouth daily with supper. ) 30 tablet 6  . pantoprazole (PROTONIX) 40 MG tablet TAKE 1 TABLET BY MOUTH TWICE DAILY (Patient not taking: No sig reported) 90 tablet 0  . predniSONE (DELTASONE) 20 MG tablet Take 2 tablets (40 mg total) by mouth daily. (Patient not taking: Reported on 12/26/2018) 8 tablet 0     Discharge Medications: Please see discharge summary for a list of discharge medications.  Relevant Imaging Results:  Relevant Lab Results:   Additional Information SS# [id number]  Hawa Henly R Pamula Luther, LCSW

## 2018-12-30 NOTE — ED Notes (Signed)
Offered for pt to ambulate to bathroom when feels urge to void. Pt declined - stating "By the time I feel the urge, it's too late".

## 2018-12-30 NOTE — Consult Note (Signed)
Telepsych Consultation   Reason for Consult:  Reports suicidal ideations, homelessness Referring Physician:  [name] Location of Patient:  Location of Provider: [facility] Department  Patient Identification: [name] White MRN:  [id number] Principal Diagnosis: Homelessness Diagnosis:  Principal Problem:   Homelessness Active Problems:   MDD (major depressive disorder), recurrent severe, without psychosis ([place])   Total Time spent with patient: 30 minutes  Subjective:   [name] White is a 63 y.o. female patient reports that she is feeling better today.  Patient reports that things have been overwhelming lately due to her circumstances.  She states that she has multiple medical issues, is on continuous oxygen, and had to leave her hotel because she ran out of [place] [employer].  She states that her family is not supportive and she has not lived with them since January 2020.  She states that the last time she even received a text from them was on about 18 June.  She reports that her biggest concerns is where she is going to live and her medical issues and getting the help that she needs.  She states that those are the biggest stressors that make her feel hopeless and have suicidal thoughts.  She states that she feels safe when she is in the hospital.  Patient then reports that she has a Education officer, museum that has been helping her get into an assisted living facility.  She states that the social worker contacted her the other day and requested that she contact the assisted living facility in [place], but instead the patient decided to come to the hospital instead of contacting the assisted living facility.  She states that she would like to go to an assisted living facility.  She states that she has not contacted her social worker or the assisted living facility let him know that she is at the hospital at the moment.  She does report having the phone numbers for both in her purse and stated that she  would contact them.  Patient denies any current suicidal or homicidal ideations and denies any hallucinations.  Patient does report that the thought of self-harm comes from when she is unsure of what is going to happen her when she leaves the emergency department.  HPI:  Dr. Jeanell Sparrow.  She was tested for COVID last week and it was negative.  She also complains of suicidality. She states she is very anxious around the COVID 19 pandemic. Patient says she has been self isolating at a local hotel.  She had been staying awhile with her sister until January.  Since then she has been in and out of rehabilitation centers.  She has been self isolating at the hotel for awhile now.  She has little family support.  She feels that they abandoned her when her health started to go down. Over the last 3 days she has been feeling like killing herself.  She has prior hx of suicide attempt by overdosing.  She is thinking of taking too much of her heart medication to kill herself now. Patient denies any HI or A/V hallucinations. Patient denies any ETOH or other illicit drug use. Patient says that she has friends that she would like to be able to talk to but she cannot visit much because of COVID 19.  Patient says that she had a rough time when her son died in 28-Sep-2009. Patient has good eye contact, gives clear answers.  She is focused and direct in her responses.  She does say  she has difficulty sleeping but that may be attributable to medical and emotional health.   Patient was inpatient at [facility] in 2017 and in 2011.  She currently is followed by [facility] for outpatient care.  Patient was seen by me via tele-psych and I have consulted with Dr. Dwyane Dee.  Patient is reporting suicidal thoughts in a way to seek secondary gain from the hospital.  Patient is stating that she would feel suicidal or may harm herself due to not knowing what is going to happen or if she is discharged without a known plan and uses her continuous oxygen use and  her medical problems as other stressors.  The patient has the resources already established to assist her with going to an assisted living facility.  She has agreed to contact them.  I have contacted the nurse that is taking care of the patient and she is going to get her purse which has the phone numbers for these resources.  I have contacted Suella Broad, PA-C and notified her of the recommendations I have requested for case management consult as well as possible PT and OT consults to assist with get into an assisted living facility.  At this time the patient does not meet inpatient criteria for psychiatric facility and is psychiatrically cleared.  Past Psychiatric History: MDD and anxiety  Risk to Self: Suicidal Ideation: Yes-Currently Present Suicidal Intent: Yes-Currently Present Is patient at risk for suicide?: Yes Suicidal Plan?: Yes-Currently Present Specify Current Suicidal Plan: overdose on heart medication Access to Means: Yes Specify Access to Suicidal Means: Heart medication What has been your use of drugs/alcohol within the last 12 months?: N/A How many times?: 1 Other Self Harm Risks: None Triggers for Past Attempts: Other (Comment)(Pt's son died ) Intentional Self Injurious Behavior: None Risk to Others: Homicidal Ideation: No Thoughts of Harm to Others: No Current Homicidal Intent: No Current Homicidal Plan: No Access to Homicidal Means: No Identified Victim: No one History of harm to others?: No Assessment of Violence: None Noted Violent Behavior Description: None  Does patient have access to weapons?: No Criminal Charges Pending?: No Does patient have a court date: No Prior Inpatient Therapy: Prior Inpatient Therapy: Yes Prior Therapy Dates: 2011 Prior Therapy Facilty/Provider(s): [facility] Reason for Treatment: after son died Prior Outpatient Therapy: Prior Outpatient Therapy: Yes Prior Therapy Dates: last 9 years Prior Therapy Facilty/Provider(s): [place] [facility] Reason for Treatment: med management; therapy Does patient have an ACCT team?: No Does patient have Intensive In-House Services?  : No Does patient have [facility] services? : Yes Does patient have P4CC services?: No  Past Medical History:  Past Medical History:  Diagnosis Date  . Anxiety   . Asthma   . Chronic lower back pain   . COPD (chronic obstructive pulmonary disease) ([place])   . Depression   . Family history of anesthesia complication    "daughter; causes her to pass out afterwards"  . GERD (gastroesophageal reflux disease)   . Hypertension   . Hyperthyroidism   . Migraine    "monthly" (12/28/2013)  . Osteoarthritis    "both knees; back of my neck; right pelvic bone" (12/28/2013)  . Paroxysmal A-fib ([place])   . Pulmonary embolism ([place]) 12/28/2013   "2 clots in each lung"    Past Surgical History:  Procedure Laterality Date  . ABDOMINAL HYSTERECTOMY    . APPENDECTOMY    . BREAST CYST EXCISION Right   . DILATION AND CURETTAGE OF UTERUS    . ELECTROPHYSIOLOGIC STUDY  N/A 05/27/2015   Procedure: A-Flutter;  Surgeon: Evans Lance, MD;  Location: [facility];  Service: Cardiovascular;  Laterality: N/A;  . EXCISIONAL HEMORRHOIDECTOMY    . NASAL SINUS SURGERY  2007  . THYROIDECTOMY, PARTIAL Right 2005  . TUBAL LIGATION    . WISDOM TOOTH EXTRACTION     Family History:  Family History  Problem Relation Age of Onset  . Osteoarthritis Mother   . Asthma Mother   . Heart failure Mother   . Breast cancer Daughter    Family Psychiatric  History: None reported Social History:  Social History   Substance and Sexual Activity  Alcohol Use Not Currently   Comment: "drank some in my 55's"     Social History   Substance and Sexual Activity  Drug Use No    Social History   Socioeconomic History  . Marital status: Single    Spouse name: Not on file  . Number of children: Not on file  . Years of education: Not on file  . Highest education level: Not on file   Occupational History  . Occupation: disabled   Social Needs  . Financial resource strain: Not on file  . Food insecurity    Worry: Not on file    Inability: Not on file  . Transportation needs    Medical: Not on file    Non-medical: Not on file  Tobacco Use  . Smoking status: Former Smoker    Packs/day: 0.25    Years: 20.00    Pack years: 5.00    Types: Cigarettes    Quit date: 07/17/1999    Years since quitting: 19.4  . Smokeless tobacco: Never Used  Substance and Sexual Activity  . Alcohol use: Not Currently    Comment: "drank some in my 30's"  . Drug use: No  . Sexual activity: Never  Lifestyle  . Physical activity    Days per week: Not on file    Minutes per session: Not on file  . Stress: Not on file  Relationships  . Social Herbalist on phone: Not on file    Gets together: Not on file    Attends religious service: Not on file    Active member of club or organization: Not on file    Attends meetings of clubs or organizations: Not on file    Relationship status: Not on file  Other Topics Concern  . Not on file  Social History Narrative   Disabled single patient    Additional Social History:    Allergies:   Allergies  Allergen Reactions  . Caffeine Other (See Comments)    Migraine  . Hydralazine Hcl     Other reaction(s): Other (See Comments) AKI leading to rhabdo and electrolyte abnormalities  . Hydrocodone Nausea And Vomiting    Headache  . Ciprofloxacin Hives and Rash  . Erythromycin Hives and Rash  . Lisinopril Cough  . Oxycodone Nausea And Vomiting    Headache  . Sulfamethoxazole-Trimethoprim Rash    Labs:  Results for orders placed or performed during the hospital encounter of 12/26/18 (from the past 48 hour(s))  Basic metabolic panel     Status: Abnormal   Collection Time: 12/29/18 10:28 AM  Result Value Ref Range   Sodium 136 135 - 145 mmol/L   Potassium 3.7 3.5 - 5.1 mmol/L   Chloride 102 98 - 111 mmol/L   CO2 27 22 - 32  mmol/L   Glucose, Bld 132 (  H) 70 - 99 mg/dL   BUN 10 8 - 23 mg/dL   Creatinine, Ser 0.63 0.44 - 1.00 mg/dL   Calcium 8.3 (L) 8.9 - 10.3 mg/dL   GFR calc non Af Amer >60 >60 mL/min   GFR calc Af Amer >60 >60 mL/min   Anion gap 7 5 - 15    Comment: Performed at [facility] [address] [place], [place] [address]    Medications:  Current Facility-Administered Medications  Medication Dose Route Frequency Provider Last Rate Last Dose  . acetaminophen (TYLENOL) tablet 650 mg  650 mg Oral Q4H PRN Pattricia Boss, MD   650 mg at 12/30/18 0946  . albuterol (VENTOLIN HFA) 108 (90 Base) MCG/ACT inhaler 1-2 puff  1-2 puff Inhalation Q4H PRN Suella Broad A, PA-C   2 puff at 12/29/18 2247  . cyclobenzaprine (FLEXERIL) tablet 10 mg  10 mg Oral Q8H PRN Pattricia Boss, MD   10 mg at 12/29/18 0130  . cycloSPORINE (RESTASIS) 0.05 % ophthalmic emulsion 1 drop  1 drop Both Eyes BID Pattricia Boss, MD   1 drop at 12/30/18 1031  . diclofenac sodium (VOLTAREN) 1 % transdermal gel 2 g  2 g Topical QID PRN Pattricia Boss, MD   2 g at 12/29/18 1142  . diltiazem (CARDIZEM CD) 24 hr capsule 120 mg  120 mg Oral Daily Pattricia Boss, MD   Stopped at 12/30/18 [phone]  . famotidine (PEPCID) tablet 20 mg  20 mg Oral Daily Pattricia Boss, MD   20 mg at 12/30/18 0942  . flecainide (TAMBOCOR) tablet 100 mg  100 mg Oral BID Pattricia Boss, MD   100 mg at 12/30/18 1031  . fluticasone (FLONASE) 50 MCG/ACT nasal spray 2 spray  2 spray Each Nare q morning - 10a Pattricia Boss, MD   2 spray at 12/30/18 0944  . hydrocortisone (ANUSOL-HC) suppository 25 mg  25 mg Rectal BID PRN Pattricia Boss, MD      . hydrOXYzine (ATARAX/VISTARIL) tablet 25 mg  25 mg Oral BID PRN Rozann Lesches, RPH   25 mg at 12/30/18 0923  . loratadine (CLARITIN) tablet 10 mg  10 mg Oral Daily Pattricia Boss, MD   10 mg at 12/30/18 0942  . magnesium oxide (MAG-OX) tablet 400 mg  400 mg Oral BID Pattricia Boss, MD   400 mg at 12/30/18 0942  . metoprolol tartrate  (LOPRESSOR) tablet 25 mg  25 mg Oral BID Pattricia Boss, MD   Stopped at 12/30/18 [phone]  . rivaroxaban (XARELTO) tablet 20 mg  20 mg Oral Q supper Pattricia Boss, MD   20 mg at 12/29/18 1737  . venlafaxine XR (EFFEXOR-XR) 24 hr capsule 150 mg  150 mg Oral Q breakfast Pattricia Boss, MD   150 mg at 12/30/18 6226   Current Outpatient Medications  Medication Sig Dispense Refill  . acetaminophen (TYLENOL) 325 MG tablet Take 2 tablets (650 mg total) by mouth every 6 (six) hours as needed for mild pain.    Marland Kitchen albuterol (PROVENTIL) (2.5 MG/3ML) 0.083% nebulizer solution Take 3 mLs (2.5 mg total) by nebulization every 6 (six) hours as needed for wheezing or shortness of breath. 75 mL 4  . azelastine (OPTIVAR) 0.05 % ophthalmic solution Place 1 drop into both eyes 2 (two) times daily as needed (itchy eyes).     . cyclobenzaprine (FLEXERIL) 10 MG tablet TAKE 1 TABLET BY MOUTH EVERY 8 HOURS AS NEEDED FOR PAIN (Patient taking differently: Take 10 mg by mouth every  8 (eight) hours as needed (pain). ) 60 tablet 0  . cycloSPORINE (RESTASIS) 0.05 % ophthalmic emulsion Place 1 drop into both eyes 2 (two) times daily.    . diclofenac sodium (VOLTAREN) 1 % GEL APPLY 2 GRAMS EXTERNALLY TO THE AFFECTED AREA FOUR TIMES DAILY (Patient taking differently: Apply 2 g topically 4 (four) times daily as needed (pain). ) 100 g 0  . diltiazem (CARDIZEM CD) 120 MG 24 hr capsule Take 1 capsule (120 mg total) by mouth daily. Please keep upcoming appt in January before anymore refills. Final Attempt (Patient taking differently: Take 120 mg by mouth at bedtime. Please keep upcoming appt in January before anymore refills. Final Attempt) 90 capsule 3  . famotidine (PEPCID) 20 MG tablet Take 1 tablet (20 mg total) by mouth daily. 90 tablet 0  . flecainide (TAMBOCOR) 100 MG tablet TAKE 1 TABLET BY MOUTH 2 TIMES A DAY. PLEASE KEEP UPCOMING APPOINTMENT IN JANUARY BEFORE ANYMORE REFILLS. (Patient taking differently: Take 100 mg by mouth 2 (two)  times daily. ) 60 tablet 6  . fluticasone (FLONASE) 50 MCG/ACT nasal spray Place 2 sprays into both nostrils every morning.    . hydrocortisone (ANUSOL-HC) 25 MG suppository Place 1 suppository (25 mg total) rectally 2 (two) times daily. (Patient taking differently: Place 25 mg rectally 2 (two) times daily as needed for hemorrhoids or anal itching. ) 12 suppository 0  . hydrOXYzine (VISTARIL) 25 MG capsule Take 25 mg by mouth 2 (two) times daily as needed for anxiety.    Marland Kitchen loratadine (CLARITIN) 10 MG tablet Take 1 tablet (10 mg total) by mouth daily. 90 tablet 0  . MAGNESIUM-OXIDE 400 (241.3 Mg) MG tablet TAKE 1 TABLET BY MOUTH TWICE DAILY (Patient taking differently: Take 400 mg by mouth 2 (two) times daily. ) 90 tablet 0  . metoprolol tartrate (LOPRESSOR) 25 MG tablet TAKE 1 TABLET BY MOUTH TWICE DAILY (Patient taking differently: Take 25 mg by mouth 2 (two) times daily. ) 120 tablet 0  . OXYGEN 2L at bedtime and prn exertion    . Potassium Chloride ER 20 MEQ TBCR TAKE 2 TABLETS BY MOUTH TWICE DAILY RELATED TO ESSENTIAL(PRIMARY) HYPERTENSION (Patient taking differently: Take 20 mEq by mouth 2 (two) times a day. ) 120 tablet 0  . traMADol (ULTRAM) 50 MG tablet Take 50 mg by mouth every 8 (eight) hours as needed for moderate pain.     Marland Kitchen venlafaxine XR (EFFEXOR-XR) 150 MG 24 hr capsule Take 150 mg by mouth daily with breakfast.    . VENTOLIN HFA 108 (90 Base) MCG/ACT inhaler INHALE 2 PUFFS INTO THE LUNGS EVERY 6 HOURS AS NEEDED FOR WHEEZING OR SHORTNESS OF BREATH (Patient taking differently: Inhale 2 puffs into the lungs every 6 (six) hours as needed for wheezing or shortness of breath. ) 18 g 0  . XARELTO 20 MG TABS tablet TAKE 1 TABLET(20 MG) BY MOUTH DAILY AFTER SUPPER (Patient taking differently: Take 20 mg by mouth daily with supper. ) 30 tablet 6  . pantoprazole (PROTONIX) 40 MG tablet TAKE 1 TABLET BY MOUTH TWICE DAILY (Patient not taking: No sig reported) 90 tablet 0  . predniSONE (DELTASONE) 20  MG tablet Take 2 tablets (40 mg total) by mouth daily. (Patient not taking: Reported on 12/26/2018) 8 tablet 0    Musculoskeletal: Strength & Muscle Tone: decreased Gait & Station: remained in bed during evaluation, but reports requiring a walker to ambulate Patient leans: N/A  Psychiatric Specialty Exam: Physical Exam  Nursing  note and vitals reviewed. Constitutional: She is oriented to person, place, and time. She appears well-developed and well-nourished.  Cardiovascular: Normal rate.  Respiratory: Effort normal.  Musculoskeletal: Normal range of motion.  Neurological: She is alert and oriented to person, place, and time.    Review of Systems  Constitutional: Negative.   HENT: Negative.   Eyes: Negative.   Respiratory: Negative.   Cardiovascular: Negative.   Gastrointestinal: Negative.   Genitourinary: Negative.   Musculoskeletal: Negative.   Skin: Negative.   Neurological: Negative.   Endo/Heme/Allergies: Negative.   Psychiatric/Behavioral: Positive for depression. The patient is nervous/anxious.     Blood pressure (!) 118/56, pulse 71, temperature 98.8 F (37.1 C), temperature source Oral, resp. rate 18, SpO2 97 %.There is no height or weight on file to calculate BMI.  General Appearance: Casual  Eye Contact:  Good  Speech:  Clear and Coherent and Normal Rate  Volume:  Normal  Mood:  Anxious and Depressed  Affect:  Congruent  Thought Process:  Coherent and Descriptions of Associations: Intact  Orientation:  Full (Time, Place, and Person)  Thought Content:  WDL  Suicidal Thoughts:  No  Homicidal Thoughts:  No  Memory:  Immediate;   Good Recent;   Good Remote;   Good  Judgement:  Fair  Insight:  Fair  Psychomotor Activity:  Normal  Concentration:  Concentration: Good and Attention Span: Good  Recall:  Good  Fund of Knowledge:  Good  Language:  Good  Akathisia:  No  Handed:  Right  AIMS (if indicated):     Assets:  Communication Skills Desire for  Improvement Financial Resources/Insurance Resilience Transportation  ADL's:  Intact  Cognition:  WNL  Sleep:        Treatment Plan Summary: social work consult  Case management consult Continue medications Possible PT and OT consult for ALF placement Contact personal social worker and ALF in [place]  Disposition: No evidence of imminent risk to self or others at present.   Patient does not meet criteria for psychiatric inpatient admission. Supportive therapy provided about ongoing stressors. Discussed crisis plan, support from social network, calling 911, coming to the Emergency Department, and calling Suicide Hotline.  This service was provided via telemedicine using a 2-way, interactive audio and video technology.  Names of all persons participating in this telemedicine service and their role in this encounter. Name: [name] White Role: Patient  Name: Marvia Pickles NP Role: Provider  Name:  Role:   Name:  Role:     Lewis Shock, FNP 12/30/2018 1:00 PM

## 2018-12-30 NOTE — ED Notes (Addendum)
Sitter ordered d/c'd d/t pt has been psych cleared. Staffing Office aware. Call bell given. Pure Wick removed as per pt's request. Stated she will advise staff when she feels urge to void/stool.

## 2018-12-30 NOTE — [facility] Counselor (Signed)
Pt presented to [facility] on 12/26/2018 with complaint of suicidal ideation.  Pt was reassessed this AM. She was tearful during assessment.  She reported that she continues to have suicidal ideation, that she does not feel safe out of the hospital, and that she would like TTS to continue to seek inpatient care.

## 2018-12-30 NOTE — TOC Initial Note (Signed)
Transition of Care [facility]) - Initial/Assessment Note    Patient Details  Name: [name] White MRN: [id number] Date of Birth: [birth year]  Transition of Care [facility]) CM/SW Contact:    Oretha Milch, LCSW Phone Number: 12/30/2018, 2:02 PM  Clinical Narrative: CSW contacted by RN on floor. CSW noted patient's report of having arranged to go to a SNF for LTAC and presented to the ED instead of the facility. CSW noted patient reported her PCP completed FL-2 and arranged for transfer. CSW reviewed and noted patient has an expired FL-2 and attempted to present in-person to [employer]. CSW reached out to [facility] regarding admissions and has not received contact back. Patient reports she is flexible for finding a place to go to. CSW notes as patient is on O2 and homeless she will likely have difficulty with discharging to the community as she has no residence or identified supports.                  Expected Discharge Plan: Skilled Nursing Facility Barriers to Discharge: Homeless with medical needs, Ship broker, Continued Medical Work up   Patient Goals and CMS Choice        Expected Discharge Plan and Services Expected Discharge Plan: [place] Choice: [place] Living arrangements for the past 2 months: Homeless                                      Prior Living Arrangements/Services Living arrangements for the past 2 months: Homeless Lives with:: Other (Comment)(Homeless) Patient language and need for interpreter reviewed:: No Do you feel safe going back to the place where you live?: No      Need for Family Participation in Patient Care: No (Comment) Care giver support system in place?: No (comment)   Criminal Activity/Legal Involvement Pertinent to Current Situation/Hospitalization: No - Comment as needed  Activities of Daily Living Home Assistive Devices/Equipment: Gilford Rile (specify type) ADL  Screening (condition at time of admission) Patient's cognitive ability adequate to safely complete daily activities?: Yes Is the patient deaf or have difficulty hearing?: No Does the patient have difficulty seeing, even when wearing glasses/contacts?: Yes(Wears reading glasses.) Does the patient have difficulty concentrating, remembering, or making decisions?: Yes Patient able to express need for assistance with ADLs?: Yes Does the patient have difficulty dressing or bathing?: Yes Independently performs ADLs?: Yes (appropriate for developmental age)(It takes her some time.) Does the patient have difficulty walking or climbing stairs?: Yes(Uses a walker.) Weakness of Legs: Both Weakness of Arms/Hands: Both("At times")  Permission Sought/Granted Permission sought to share information with : Facility Art therapist granted to share information with : Yes, Release of Information Signed  Share Information with NAME: Facility contacts for SNF referral           Emotional Assessment Appearance:: Appears older than stated age Attitude/Demeanor/Rapport: Guarded Affect (typically observed): Apprehensive Orientation: : Oriented to Self, Oriented to Place, Oriented to  Time, Oriented to Situation Alcohol / Substance Use: Not Applicable Psych Involvement: Yes (comment)(Cleared by TTS)  Admission diagnosis:  afib Patient Active Problem List   Diagnosis Date Noted  . Homelessness 12/30/2018  . Encounter for rehabilitation 12/04/2018  . Exposure to Covid-19 Virus 11/02/2018  . Reactive airway disease 09/10/2018  . Shortness of breath 09/09/2018  . Acute bronchitis 08/29/2018  . Acute on chronic respiratory failure  with hypoxia ([place]) 08/29/2018  . Sleep apnea 07/05/2018  . Chronic respiratory failure with hypoxia ([place]) 07/05/2018  . Osteoarthritis 03/02/2018  . Viral illness 02/13/2018  . Nausea 02/13/2018  . Left medial tibial stress syndrome 12/26/2017  . Hematoma  11/27/2017  . Severe episode of recurrent major depressive disorder, without psychotic features ([place])   . MDD (major depressive disorder), recurrent episode, severe ([name]) 09/07/2015  . Atrial flutter ([place]) 07/29/2015  . History of atrial flutter 06/26/2015  . Chest pain 03/22/2015  . Asthma 11/12/2014  . History of hypertension 11/05/2014  . Atrial fibrillation, chronic 11/03/2014  . Chronic anticoagulation 11/03/2014  . History of pulmonary embolus (PE) 11/03/2014  . Non-traumatic rhabdomyolysis 11/03/2014  . Major depressive disorder 11/03/2014  . SOB (shortness of breath)   . MDD (major depressive disorder), recurrent severe, without psychosis ([place]) 03/05/2014  . Pulmonary embolism ([place]) 12/27/2013  . Vocal cord dysfunction 11/12/2013  . Mixed headache 01/06/2012  . OVERACTIVE BLADDER 04/18/2008  . Osteoarthritis of both knees 04/18/2008  . POLYARTHRITIS 09/14/2007  . Morbid obesity ([place]) 08/24/2006  . RESTLESS LEGS SYNDROME 08/24/2006  . HYPERTENSION, BENIGN SYSTEMIC 08/24/2006  . RHINITIS, ALLERGIC 08/24/2006  . GASTROESOPHAGEAL REFLUX, NO ESOPHAGITIS 08/24/2006   PCP:  [name], MD Pharmacy:   [facility] [place] [place] [address] Phone: [phone] Fax: [phone]  [facility] - [place], [address] [place] [address] Phone: [phone] Fax: [phone]     Social Determinants of Health (SDOH) Interventions    Readmission Risk Interventions No flowsheet data found.

## 2018-12-30 NOTE — ED Provider Notes (Addendum)
63 year old female in emergency room voluntarily for depression and suicidal ideation.  Patient is on home oxygen which has made placement difficult.  Patient evaluated by behavioral health this morning, continues to seek placement.  Patient does not have any needs at this time.   [name], PA-C 12/30/18 1110  1:00 PM Case discussed with Darnelle Maffucci with [facility] who has seen the patient and reviewed the case. Patient ran out of money and had to leave the hotel she was staying in, has a Education officer, museum and was supposed to go to an assisted living facility in [place]. May need PT/OT consult for placement.     [name], PA-C 12/30/18 [place], [place], DO 01/02/19 1511

## 2018-12-30 NOTE — Evaluation (Signed)
Physical Therapy Evaluation Patient Details Name: [name] White MRN: [id number] DOB: [birth year] Today's Date: 12/30/2018   History of Present Illness  Pt is a 63 y.o. F with significant PMH of COPD, afib, PE who presents to ED with suicidal ideations and homelessness.  Clinical Impression  Pt admitted with above diagnosis. Pt currently with functional limitations due to the deficits listed below (see PT Problem List). Pt presents with decreased functional mobility secondary to weakness, decreased cardiovascular endurance, and balance impairments. Ambulating 20 feet x 2 with Rollator and min guard assist (requiring an extended seated rest break). Spo2 92-94% on 2L O2. Presents as high fall risk based on decreased gait speed. Recommending SNF based on deficits and decreased caregiver support in order to maximize functional independence.      Follow Up Recommendations SNF    Equipment Recommendations  None recommended by PT    Recommendations for Other Services       Precautions / Restrictions Precautions Precautions: Fall Restrictions Weight Bearing Restrictions: No      Mobility  Bed Mobility Overal bed mobility: Needs Assistance Bed Mobility: Supine to Sit     Supine to sit: Supervision     General bed mobility comments: Increased time/effort. HOB elevated  Transfers Overall transfer level: Needs assistance Equipment used: 4-wheeled walker Transfers: Sit to/from Stand Sit to Stand: Min guard            Ambulation/Gait Ambulation/Gait assistance: Min guard Gait Distance (Feet): 20 Feet(x2) Assistive device: 4-wheeled walker Gait Pattern/deviations: Step-through pattern;Decreased stride length;Narrow base of support;Trunk flexed Gait velocity: decreased Gait velocity interpretation: <1.31 ft/sec, indicative of household ambulator General Gait Details: wide BOS, decreased bilateral foot clearance, fatigues easily  Stairs            Wheelchair Mobility    Modified Rankin (Stroke Patients Only)       Balance Overall balance assessment: Needs assistance Sitting-balance support: Feet supported Sitting balance-Leahy Scale: Good     Standing balance support: Bilateral upper extremity supported Standing balance-Leahy Scale: Poor Standing balance comment: reliant on external support                             Pertinent Vitals/Pain Pain Assessment: Faces Faces Pain Scale: Hurts little more Pain Location: headache    Home Living Family/patient expects to be discharged to:: Shelter/Homeless                      Prior Function Level of Independence: Needs assistance   Gait / Transfers Assistance Needed: uses walker  ADL's / Homemaking Assistance Needed: needs assist for ADL's        Hand Dominance        Extremity/Trunk Assessment   Upper Extremity Assessment Upper Extremity Assessment: Generalized weakness    Lower Extremity Assessment Lower Extremity Assessment: Generalized weakness    Cervical / Trunk Assessment Cervical / Trunk Assessment: Other exceptions Cervical / Trunk Exceptions: increased body habitus  Communication   Communication: No difficulties  Cognition Arousal/Alertness: Awake/alert Behavior During Therapy: WFL for tasks assessed/performed Overall Cognitive Status: Within Functional Limits for tasks assessed                                 General Comments: WFL for basic mobility tasks      General Comments      Exercises     Assessment/Plan  PT Assessment Patient needs continued PT services  PT Problem List Decreased strength;Decreased activity tolerance;Decreased balance;Decreased mobility;Cardiopulmonary status limiting activity       PT Treatment Interventions DME instruction;Gait training;Functional mobility training;Therapeutic activities;Therapeutic exercise;Balance training;Patient/family education    PT Goals (Current goals can be found  in the Care Plan section)  Acute Rehab PT Goals Patient Stated Goal: none stated; agreeable to therapy PT Goal Formulation: With patient Time For Goal Achievement: 01/13/19 Potential to Achieve Goals: Fair    Frequency Min 3X/week   Barriers to discharge        Co-evaluation               AM-PAC PT "6 Clicks" Mobility  Outcome Measure Help needed turning from your back to your side while in a flat bed without using bedrails?: None Help needed moving from lying on your back to sitting on the side of a flat bed without using bedrails?: A Little Help needed moving to and from a bed to a chair (including a wheelchair)?: A Little Help needed standing up from a chair using your arms (e.g., wheelchair or bedside chair)?: A Little Help needed to walk in hospital room?: A Little Help needed climbing 3-5 steps with a railing? : Total 6 Click Score: 17    End of Session Equipment Utilized During Treatment: Oxygen Activity Tolerance: Patient tolerated treatment well Patient left: in bed;with call bell/phone within reach;Other (comment)(sitting EOB) Nurse Communication: Mobility status PT Visit Diagnosis: Unsteadiness on feet (R26.81);Muscle weakness (generalized) (M62.81);Difficulty in walking, not elsewhere classified (R26.2)    Time: 0301-3143 PT Time Calculation (min) (ACUTE ONLY): 21 min   Charges:   PT Evaluation $PT Eval Moderate Complexity: 1 Mod          Ellamae Sia, [place], DPT Acute Rehabilitation Services Pager [phone] Office [phone]   Willy Eddy 12/30/2018, 5:14 PM

## 2018-12-30 NOTE — Progress Notes (Addendum)
1:10PM: CSW spoke with PA regarding disposition. CSW notes as patient is homeless and has no residence, required O2, PA has concerns with patient being discharged safely. CSW will begin SNF work-up process and requested PT consult be placed.  CSW was informed that patient had a LTAC bed at [facility] but was unable to go as their beds were filled when she presented. CSW reached out to [employer] to confirm. CSW will continue to follow for discharge support.  Lamonte Richer, LCSW, [place] Worker II [phone]

## 2018-12-30 NOTE — ED Notes (Signed)
[name] White, SW, spoke w/pt.

## 2018-12-30 NOTE — ED Notes (Signed)
Pt noted w/O2 infusing via n/c @ 2L/min and Pure Wick intact - draining yellow urine.

## 2018-12-31 DIAGNOSIS — R0600 Dyspnea, unspecified: Secondary | ICD-10-CM | POA: Diagnosis not present

## 2018-12-31 MED ORDER — DIPHENHYDRAMINE HCL 25 MG PO CAPS
50.0000 mg | ORAL_CAPSULE | Freq: Once | ORAL | Status: AC
  Administered 2018-12-31: 03:00:00 50 mg via ORAL
  Filled 2018-12-31: qty 2

## 2018-12-31 NOTE — Progress Notes (Addendum)
10am: CSW confirmed with [name] that this patient is not a resident at [facility]. [name] states there is no bed availability today either.  CSW spoke with [place], RN who inquired with patient if she had any other options for placement, patient stated [place] and CSW informed RN that CSW would try other local facilities as well and patient stated agreement. CSW faxed out to [facility], [place], [place], [facility], [facility], and [facility] for review.  9am: CSW spoke with Crystal at [facility] to determine if this patient would need a waiver and Crystal confirmed that this patient is not managed by [facility]. Crystal states the patient would not require a waiver for SNF placement.  CSW attempted to contact [name] at [employer], voicemail was left requesting return call.  [name], MSW, LCSW-A Clinical Social Worker [facility] [phone]

## 2018-12-31 NOTE — ED Notes (Signed)
Lunch ordered 

## 2019-01-01 DIAGNOSIS — R0600 Dyspnea, unspecified: Secondary | ICD-10-CM | POA: Diagnosis not present

## 2019-01-01 NOTE — Progress Notes (Signed)
CSW called [name] White at [facility] to request a review of pt's referral and a return call on 7/7 to the CSW on duty.  CSW will continue to follow for D/C needs.  Alphonse Guild. Tashira Torre, LCSW, LCAS, CSI Transitions of Care Clinical Social Worker Care Coordination Department Ph: [phone]

## 2019-01-01 NOTE — ED Notes (Signed)
Breakfast tray ordered 

## 2019-01-01 NOTE — ED Notes (Signed)
Pt noted w/O2 n/c off - states "Oh, I'll put it back on". Pt placed n/c - infusing O2 at 2L/min.

## 2019-01-01 NOTE — ED Notes (Signed)
Pt verbalized moderate anxiety. Given PRN medication. Will continue to monitor

## 2019-01-01 NOTE — ED Notes (Signed)
Pt denying any SI/HI thoughts at this time. Offered nightly snack

## 2019-01-01 NOTE — ED Notes (Signed)
Pt eating dinner. Pt has been ambulating to bedside commode via walker w/o difficulty.

## 2019-01-01 NOTE — Discharge Planning (Signed)
Clinical Social Work is seeking post-discharge placement for this patient at the following level of care: SNF.    

## 2019-01-01 NOTE — Progress Notes (Signed)
2nd shift ED CSW received a handoff from the 1st shift [facility] ED CSW.   CSW reviewed notes and sees 1st shift ED CSW received permission from the pt to refer pt out to other local faciities.  CSW referred pt out via the hub to other [place] facilities.  CSW will continue to follow for D/C needs.  Alphonse Guild. Benjamyn Hestand, LCSW, LCAS, CSI Transitions of Care Clinical Social Worker Care Coordination Department Ph: [phone]

## 2019-01-01 NOTE — ED Provider Notes (Signed)
63 year old female awaiting nursing home placement seen on rounds today.  Patient reports itching in her low back and buttocks areas.  Patient is requesting Benadryl, she has a Vistaril as needed order.  Patient skin is intact, no rash present.  Concern for patient potentially developing decubitus ulcer due to prolonged time spent in her bed, encouraged patient to ambulate frequently, change positions.  Patient was given lotion to apply to the area.   [name], PA-C 01/01/19 1313    [name], MD 01/06/19 [phone]

## 2019-01-01 NOTE — Progress Notes (Signed)
Attempted to see pt but her lunch arrived as PT was trying to begin therapy.  Will try again as time and pt allow.   01/01/19 1200  PT Visit Information  Last PT Received On 01/01/19  Reason Eval/Treat Not Completed Other (comment)    Mee Hives, PT MS Acute Rehab Dept. Number: [place] [facility] [place]

## 2019-01-01 NOTE — ED Notes (Signed)
Pt up to restroom. Ambulating well with walker.

## 2019-01-02 DIAGNOSIS — R0600 Dyspnea, unspecified: Secondary | ICD-10-CM | POA: Diagnosis not present

## 2019-01-02 NOTE — Progress Notes (Signed)
Physical Therapy Treatment Patient Details Name: [name] White MRN: [id number] DOB: 06-10-1956 Today's Date: 01/02/2019    History of Present Illness Pt is a 63 y.o. F with significant PMH of COPD, afib, PE who presents to ED with suicidal ideations and homelessness.    PT Comments    Patient seen for mobility progression. Min A to stand at bedside with poor posturing. Gait limited due to SOB on RA - improved with seated rest and cueing for pursed lip breathing. Discussion with patient regarding seated ther-ex as well as frequent short trips during the day with nursing staff for improved activity tolerance/endurance with good understanding. Will continue to recommend SNF at discharge. Will continue to follow.    Follow Up Recommendations  SNF     Equipment Recommendations  None recommended by PT    Recommendations for Other Services       Precautions / Restrictions Precautions Precautions: Fall Restrictions Weight Bearing Restrictions: No    Mobility  Bed Mobility Overal bed mobility: Needs Assistance Bed Mobility: Supine to Sit     Supine to sit: Supervision     General bed mobility comments: Increased time/effort. HOB elevated  Transfers Overall transfer level: Needs assistance Equipment used: Standard walker Transfers: Sit to/from Stand Sit to Stand: Min assist         General transfer comment: Min A to rise from EOB; heavy forward flexion with initial standing  Ambulation/Gait Ambulation/Gait assistance: Min guard Gait Distance (Feet): 25 Feet(x2) Assistive device: Standard walker Gait Pattern/deviations: Step-to pattern;Step-through pattern;Decreased stride length;Trunk flexed Gait velocity: decreased   General Gait Details: wide BOS - SOB on RA 87-94%   Stairs             Wheelchair Mobility    Modified Rankin (Stroke Patients Only)       Balance Overall balance assessment: Needs assistance Sitting-balance support: No upper  extremity supported;Feet supported Sitting balance-Leahy Scale: Good     Standing balance support: Bilateral upper extremity supported;During functional activity Standing balance-Leahy Scale: Poor Standing balance comment: reliant on external support                            Cognition Arousal/Alertness: Awake/alert Behavior During Therapy: WFL for tasks assessed/performed Overall Cognitive Status: Within Functional Limits for tasks assessed                                        Exercises      General Comments        Pertinent Vitals/Pain Pain Assessment: No/denies pain    Home Living Family/patient expects to be discharged to:: Shelter/Homeless                    Prior Function            PT Goals (current goals can now be found in the care plan section) Acute Rehab PT Goals Patient Stated Goal: get stronger PT Goal Formulation: With patient Time For Goal Achievement: 01/13/19 Potential to Achieve Goals: Fair Progress towards PT goals: Progressing toward goals    Frequency    Min 3X/week      PT Plan Current plan remains appropriate    Co-evaluation              AM-PAC PT "6 Clicks" Mobility   Outcome Measure  Help needed turning from  your back to your side while in a flat bed without using bedrails?: None Help needed moving from lying on your back to sitting on the side of a flat bed without using bedrails?: A Little Help needed moving to and from a bed to a chair (including a wheelchair)?: A Little Help needed standing up from a chair using your arms (e.g., wheelchair or bedside chair)?: A Little Help needed to walk in hospital room?: A Little Help needed climbing 3-5 steps with a railing? : Total 6 Click Score: 17    End of Session Equipment Utilized During Treatment: Oxygen Activity Tolerance: Patient tolerated treatment well Patient left: in bed;with call bell/phone within reach;Other (comment)(sitting  EOB ) Nurse Communication: Mobility status PT Visit Diagnosis: Unsteadiness on feet (R26.81);Muscle weakness (generalized) (M62.81);Difficulty in walking, not elsewhere classified (R26.2)     Time: 2947-6546 PT Time Calculation (min) (ACUTE ONLY): 22 min  Charges:  $Gait Training: 8-22 mins                      Lanney Gins, PT, DPT Supplemental Physical Therapist 01/02/19 2:36 PM Pager: [phone] Office: [phone]

## 2019-01-02 NOTE — ED Provider Notes (Signed)
63yo female, awaiting NH placement. No needs at this time. Patient is awake, alert, pleasant. Patient spends the majority of her day lying in the hospital bed. Encouraged patient to transfer to chair/walk with her walker.    [name], PA-C 01/02/19 1587    Carmin Muskrat, MD 01/02/19 [phone]

## 2019-01-02 NOTE — Progress Notes (Addendum)
CSW sees pt was accepted for review from [facility].  CSW called [name] at [facility] SNF on her secure VM to ask that she review the pt's referral further and requested a call back.  6:36 PM CSW received a call back from [facility] SNF stating they cannot accept the pt.  Per the hub, the pt is now not being reviewed by any other SNF's.  2nd shift ED CSW will leave handoff for 1st shift ED CSW.  CSW will continue to follow for D/C needs.  Alphonse Guild. Daena Alper, LCSW, LCAS, CSI Transitions of Care Clinical Social Worker Care Coordination Department Ph: [phone]

## 2019-01-02 NOTE — ED Notes (Signed)
Pt comfortable in bed. No requests at this time

## 2019-01-02 NOTE — ED Notes (Signed)
Pt ambulating from bed to [facility] independently w walker

## 2019-01-03 DIAGNOSIS — R0600 Dyspnea, unspecified: Secondary | ICD-10-CM | POA: Diagnosis not present

## 2019-01-03 NOTE — Discharge Summary (Signed)
Clinical Social Work is seeking post-discharge placement for this patient at the following level of care: SNF.    

## 2019-01-03 NOTE — ED Provider Notes (Signed)
1606: Patient evaluated.  She is asleep in no distress.  I have spoken to RN Santiago Glad and discussed patient's ER plan of care and disposition.  Patient has been medically cleared and she is awaiting social work placement.  COVID negative 7/1.   Kinnie Feil, PA-C 01/03/19 1607    Lacretia Leigh, MD 01/04/19 [phone]

## 2019-01-03 NOTE — Progress Notes (Signed)
CSW called [employer] [facility] who had previously said they would not review the pt for homelessness issues (SNF states this may be an unsafe D/C due to pt's homelessness if they were to accept pt), but states they will bring pt's referral to the admin there and call the CSW back.  4:41 PM CSW received a call back from the [facility] stating they will not accept pt due to the pt's previous suicidal complaints.  CSW will continue to follow for D/C needs.  Alphonse Guild. Mirah Nevins, LCSW, LCAS, CSI Transitions of Care Clinical Social Worker Care Coordination Department Ph: [phone]

## 2019-01-03 NOTE — Progress Notes (Signed)
2nd shift ED CSW received a handoff from the 1st shift [facility] ED CSW.   Pt is now not being reviewed for admission from any other SNF's in the area and was turned down for SNF Rehab by the only facility that was reviewing her on 01/02/2019.  CSW reviewed chart and sees pt was only authorized SNF rehab days in Feb and March after being initially denied and after a peer-to-peer was completed wioth providers which makes it highly unlikely pt will be authorized a third time as pt's issues with SOB seem to be pt's baseline at present.  Pt is not being accepted for review by facilities pt has been referred to and this is probably the reason.  CSW will continue to follow for D/C needs.  [name] White. Luther Newhouse, LCSW, LCAS, CSI Transitions of Care Clinical Social Worker Care Coordination Department Ph: [phone]

## 2019-01-03 NOTE — Progress Notes (Addendum)
[name] updated pt has no more local placement options from social work.  Please reconsult if future social work needs arise.  CSW signing off, as social work intervention is no longer needed.  Alphonse Guild. Muhamed Luecke, LCSW, LCAS, CSI Transitions of Care Clinical Social Worker Care Coordination Department Ph: [phone]           ]

## 2019-01-03 NOTE — ED Notes (Signed)
Pt is resting quietly without complaints

## 2019-01-03 NOTE — Progress Notes (Signed)
CSW attempted to update [name] but [name] in a process at the moment and will need a call back.  CSW will continue to follow for D/C needs.  Alphonse Guild. Jakaila Norment, LCSW, LCAS, CSI Transitions of Care Clinical Social Worker Care Coordination Department Ph: [phone]

## 2019-01-03 NOTE — ED Notes (Signed)
Assisted with shower per Izora Gala, NT -= linen changed on bed also.

## 2019-01-03 NOTE — Progress Notes (Signed)
Patient has been declined by [facility]. Patient has not had any other bed offers.   Golden Circle, LCSW Transitions of Care Department [facility] ED [phone]

## 2019-01-03 NOTE — ED Notes (Signed)
Breakfast ordered 

## 2019-01-04 DIAGNOSIS — R0600 Dyspnea, unspecified: Secondary | ICD-10-CM | POA: Diagnosis not present

## 2019-01-04 NOTE — ED Notes (Signed)
Breakfast ordered 

## 2019-01-04 NOTE — Progress Notes (Signed)
PT attempting to see patient for mobility progression - patient stating she has just ambulated out of her room to restroom. Lengthy discussion with patient regarding her frustration about discharge planning - reassurance that social work was involved and continuing to assist in this area. Discussed continued mobility with nursing staff as well as seated and supine AROM to maintain strength, mobility, and endurance. PT to continue to follow.  Lanney Gins, PT, DPT Supplemental Physical Therapist 01/04/19 3:57 PM Pager: [phone] Office: [phone]

## 2019-01-04 NOTE — ED Notes (Signed)
Pt requested graham crackers and ginger ale for snack pt given the same

## 2019-01-04 NOTE — ED Provider Notes (Signed)
Emergency Medicine Observation Re-evaluation Note  [name] White is a 63 y.o. female, seen on rounds today.  Pt initially presented to the ED for complaints of Atrial Fibrillation and Shortness of Breath Currently, the patient is resting comfortably in bed.  She reports being upset that none of the significantly [place] are willing to accept her.  Social work is involved in her care.  She states that she has told them that they can look outside the county if needed for placement.  She is upset because she feels like her family has just abandoned her as they do not want to deal with her health issues.  She denies any physical complaints or concerns today.  Physical Exam  BP (!) 160/79 (BP Location: Right Arm)   Pulse 66   Temp 98.3 F (36.8 C) (Oral)   Resp 18   SpO2 96%  Physical Exam Vitals signs and nursing note reviewed.  HENT:     Head: Normocephalic.  Skin:    General: Skin is warm.  Neurological:     General: No focal deficit present.     Mental Status: She is alert.  Psychiatric:     Comments: Tearful, calm, cooperative     ED Course / MDM  EKG:EKG Interpretation  Date/Time:  Wednesday December 26 2018 11:52:32 EDT Ventricular Rate:  109 PR Interval:    QRS Duration: 83 QT Interval:  325 QTC Calculation: 440 R Axis:   52 Text Interpretation:  Sinus tachycardia rate has increase since last tracing 20 December 2018 Confirmed by Pattricia Boss [phone]) on 12/26/2018 11:58:20 AM    I have reviewed the labs performed to date as well as medications administered while in observation.  Recent changes in the last 24 hours include she has been declined reportedly by the SNF locations in [facility].. Plan  Current plan is for seeking SNF placement outside of the county. Patient is not under full IVC at this time.   Lorin Glass, PA-C 01/04/19 7408    Isla Pence, MD 01/04/19 1001

## 2019-01-04 NOTE — Discharge Planning (Signed)
[facility] met with pt at bedside regarding disposition update.  [facility] informed pt that we are not having success as it pertains to SNF placement.  Pt suggested neighboring counties as going home/hotel is not a safe option for her at this time due to her health issues.Pt very pleasant, engaged and hopeful for an agreeable disposition.

## 2019-01-04 NOTE — Progress Notes (Addendum)
11:50a CSW received call from RN stating that patient wanted to speak to [name]. Patient stated she did not understand what was going on with her being placed at a facility. RN CM Camellia spoke with patient earlier and patient stated at that time going to a hotel/shelter was not safe for her due to her medical issues. CSW explained to patient that she has not had any bed offers and most are due to her insurance is not in network. Patient asked if this was a "personal thing" with her being declined from patient and CSW informed her it was not. Patient gave CSW numbers a social worker named [name] White [phone]) who was assisting her in getting placement when she was in the community. Patient stated she wanted to give this information and just got her phone to retreive the numbers. Patient stated she did not do this earlier because she was unaware that finding placement for her was an issue. Patient stated she felt that she is going to be kicked out on the streets. CSW explained that she will not, but her options for placement are depleting. Patient also gave names of other facilities, but they were ALFs, not SNFs.    CSW contacted [name] White with [facility] who stated she was assisting patient with getting into [facility], but they would not take patient at the time due to being in the community with no COVID testing. CSW followed up with [name] at [facility] who stated the patient has been declined by their staff and she was unsure of the exact reason patient was declined.  CSW also inform providing PA at this time what is going on with patient.  10:50a CSW contacted patient's sister, [name] White, who reports she is unable to take patient into her home due to someone in her home recently testing positive for COVID. [name] White reports patient and patient's daughter do not have a good relationship and patient's daughter lives in [place]. CSW was unable to reach patient's daughter.  9:39a CSW  faxed out patient to more SNF facilites outside of the [place] area to attempt to get more bed offers.   Golden Circle, LCSW Transitions of Care Department [facility] ED [phone]

## 2019-01-04 NOTE — TOC Progression Note (Signed)
Transition of Care [facility]) - Progression Note    Patient Details  Name: [name] White MRN: [id number] Date of Birth: March 21, 1956  Transition of Care [facility]) CM/SW Contact  [name], RN Phone Number: 01/04/2019, 2:07 PM  Clinical Narrative:    [facility] met with pt at bedside regarding disposition needs.  Pt upset that she may be discharged without a safe plan.  EDCM assured pt that we will do everything we can to assist her with a safe discharge.  Pt states she will contact her insurance company for a list of SNFs in the area that is covered under her plan as she is aware that she has multiple denials due to being out of network.    EDCM will continue to follow for disposition needs.   Expected Discharge Plan: Skilled Nursing Facility Barriers to Discharge: Homeless with medical needs, Ship broker, Continued Medical Work up  Expected Discharge Plan and Services Expected Discharge Plan: [place]: [place] arrangements for the past 2 months: Homeless                                       Social Determinants of Health (SDOH) Interventions    Readmission Risk Interventions No flowsheet data found.

## 2019-01-04 NOTE — Progress Notes (Addendum)
CSW called pt and told pt that pt's daughter was requesting pt call her.  CSW called pt and requested verbal permission to speak to the pt's daughter.  CSW reiterated to the pt that the ED was out of options for placement for the pt and reiterated the barriers previously explained to her by the [facility] RN CM/CSW's:  1.  Suicidal complaints being seen as a risk by SNF's ALF's 2. Homnelsssness is seen as risk due to no safe D/C plan from the SNF/ALF 3.  Pt was recommended for geripsyche placement before being cleared.  Pt was asked by by the CSW if she had made any phone calls to any facilities or relatives in regards to a safe D/C plan as she had said she would do earlier today and pt stated she intended to wait until 7pm because the places she planned to call closed at [place] invited pt to consider the urgent need to call  Relatives who could pick her up today as social work had no other options at this time offering her a bed and she may be D/C'd without warning at any time without a place to go besides shelter options.  Pt asked, "should I call a relative to pick me up" and CSW replied, "I would invite you to consider doing that should you be D/C'd without a place to go".  CSW stated CSW wanted to make it clear pt can be D/C's to a shelter if placement is not possible which it has not been as of today.  Pt stated she would make some calls.  CSW stated he would call the pt back at 6pm to determine pt's results in finding a place.  CSW received a call from pt's daughter Hillery Hunter who stated pt was to come to live near her prior to [place] but pt was then not able to come to [place] due to the pandemic.   Pt's daughter states pt has a HX of not taking ownership in finding a place to go and that this has always been a problem in the past and stated that if pt is not making an effort to help herself then the pt needs to call the pt's daughter.   Per pt's daughter the pt needs to call her  because she is willing to help the pt if the pt is helping herself but not if there pt is just sitting and waiting on others to help her.  Per pt's daughter "there is no reason she should not have a place to stay as many leads as I have given her about places to stay that she can definately afford".  Per pt's daughter the pt could have housing now if the pt had "put in the effort".  CSW will continue to follow for D/C needs.  Alphonse Guild. Linell Meldrum, LCSW, LCAS, CSI Transitions of Care Clinical Social Worker Care Coordination Department Ph: [phone]

## 2019-01-04 NOTE — ED Notes (Signed)
Patient denies pain and is resting comfortably.  

## 2019-01-05 DIAGNOSIS — R0600 Dyspnea, unspecified: Secondary | ICD-10-CM | POA: Diagnosis not present

## 2019-01-05 LAB — SARS CORONAVIRUS 2 BY RT PCR (HOSPITAL ORDER, PERFORMED IN ~~LOC~~ HOSPITAL LAB): SARS Coronavirus 2: NEGATIVE

## 2019-01-05 MED ORDER — ALBUTEROL SULFATE HFA 108 (90 BASE) MCG/ACT IN AERS
1.0000 | INHALATION_SPRAY | RESPIRATORY_TRACT | Status: DC | PRN
  Administered 2019-01-05: 2 via RESPIRATORY_TRACT
  Filled 2019-01-05: qty 6.7

## 2019-01-05 NOTE — ED Notes (Signed)
Breakfast tray ordered 

## 2019-01-05 NOTE — ED Notes (Signed)
Pt reporting episode of chest tightness and wheezing which she says sometimes happens. Rescue inhaler given Sp02 99% on 2L.

## 2019-01-05 NOTE — Progress Notes (Signed)
CSW spoke with patient regarding discharge. CSW noted per patient her family is not involved and the family she reached would not help. CSW noted patient stated she was involved with [facility]. CSW left v/m with [facility] weekend. CSW confirmed with [name] they will not take patient back. CSW updated care coordination to reflect this. CSW discussed with patient returning to a motel with home health. CSW noted patient was hesitant for that option. CSW is awaiting response from [facility] and noted patient would be open to discharge to a motel if there is no other options.  Lamonte Richer, LCSW, [place] Worker II [phone]

## 2019-01-05 NOTE — Progress Notes (Signed)
All local facilities have denied pt due to homelessness which does not leave SNF a safe D/C plan or suicidality by pt upon coming to the ED. Pt has been informed and it has been recommended to pt that by CSW on 7/10 that she attempt family contact to p/u pt and pt has refused to call family, per pt's daughter who is willing to help, or other facilities on her own.  [name] on 7/9 recommended D/C.  2nd shift ED CSW will leave handoff for 1st shift ED CSW.  CSW will continue to follow for D/C needs.  Alphonse Guild. Cederic Mozley, LCSW, LCAS, CSI Transitions of Care Clinical Social Worker Care Coordination Department Ph: [phone]

## 2019-01-05 NOTE — Discharge Planning (Signed)
[facility] consulted regarding DME oxygen.  Pt receives oxygen through [facility].  EDCM contacted [facility] to have oxygen tank delivered to pt room today.

## 2019-01-05 NOTE — ED Provider Notes (Addendum)
RN Julien Girt informed me that the patient has been complaining of a dry cough.  1 of our RNs is currently quarantining at home due to [place] positive status and has interacted with the patient while she has been here for the last 11 days.  The patient had mild chest tightness and wheezing which is not unusual for her earlier today which resolved with rescue inhaler.  The patient had a negative COVID swab 11 days ago but with complaint of mild cough we will order a repeat COVID test.  5:23 PM COVID test is negative.  Patient in no apparent distress.   [name] White was evaluated in Emergency Department on 01/05/2019 for the symptoms described in the history of present illness. She was evaluated in the context of the global COVID-19 pandemic, which necessitated consideration that the patient might be at risk for infection with the SARS-CoV-2 virus that causes COVID-19. Institutional protocols and algorithms that pertain to the evaluation of patients at risk for COVID-19 are in a state of rapid change based on information released by regulatory bodies including the CDC and federal and state organizations. These policies and algorithms were followed during the patient's care in the ED.    Renita Papa, PA-C 01/05/19 1725    Renita Papa, PA-C 01/05/19 1733    Drenda Freeze, MD 01/05/19 [phone]

## 2019-01-06 DIAGNOSIS — R0600 Dyspnea, unspecified: Secondary | ICD-10-CM | POA: Diagnosis not present

## 2019-01-06 NOTE — ED Notes (Signed)
Case Management [name] White at bedside

## 2019-01-06 NOTE — Progress Notes (Signed)
ED CM met with patient at bedside to discuss transitional care planning. Patient reports that she has not discussed the plan with anyone yet. She states, she has been coordinating with [place] concerning ALF, and has not communicated since coming in. Patient apparently has been living in a motel after losing her apartment several months ago. Patient admitted to slipping in and out of depression after the loss of her son a couple of years ago. Patient has a [place] NP and Counselor at [facility] that she actively communicates with. Patient stresses that she has been in contact with all of her Healthcare Providers attempting to get assistance up until being brought in by EMS on 7/4. Discussed with ED CSW,  Sent in-basket message to [place] CSW.  Updated patient on the plan to follow up on care transitional planning tomorrow by ED CSW.

## 2019-01-06 NOTE — ED Notes (Signed)
Breakfast tray ordered 

## 2019-01-06 NOTE — ED Notes (Signed)
Lunch tray ordered 

## 2019-01-06 NOTE — ED Notes (Addendum)
Patient denies pain and is resting comfortably. Pt requests to let her continue to sleep if she is sleeping around 2200 when meds are given and that she will take them when she wakes up.

## 2019-01-06 NOTE — ED Provider Notes (Signed)
Emergency Medicine Observation Re-evaluation Note  [name] White is a 63 y.o. female, seen on rounds today.  Pt initially presented to the ED for complaints of Atrial Fibrillation and Shortness of Breath  On evaluation patient is resting comfortably eating her breakfast  Physical Exam   Blood pressure (!) 147/78, pulse 87, temperature 98.1 F (36.7 C), temperature source Oral, resp. rate 16, SpO2 100 %.  Physical Exam Vitals signs and nursing note reviewed.  General:     Alert, well developed, well nourished.  HENT:     Head: Normocephalic. Atrauamtic Heart:      Regular rate Pulmonary:      Effort normal.   Neurological:     Mental Status: She is alert.  Psychiatric:     Comments: Calm, cooperative.    ED Course / MDM   Over past 24 hours covid swab was repeated & negative Additional labs have been reviewed.   Plan  Per CSW note this AM working on discharge plan. Discussed shelters in the area. Pending re-discussion of CSW w/ patient.     [name] White 01/06/19 0923    [name] Manifold, MD 01/06/19 1143

## 2019-01-06 NOTE — Progress Notes (Signed)
CSW met with patient to discuss discharge plan. CSW informed patient that would need a plan for discharge ASAP. CSW discussed shelters and provided shelter information with the patient. CSW notes patient did not respond positively to this discussion. CSW informed patient [facility] has not returned a call yet and we would still need to have a plan we are working towards. CSW will give patient time to reflect and will follow up with patient.  Lamonte Richer, LCSW, [place] Worker II [phone]

## 2019-01-07 ENCOUNTER — Other Ambulatory Visit: Payer: Self-pay | Admitting: *Deleted

## 2019-01-07 DIAGNOSIS — J9611 Chronic respiratory failure with hypoxia: Secondary | ICD-10-CM | POA: Diagnosis not present

## 2019-01-07 DIAGNOSIS — R0600 Dyspnea, unspecified: Secondary | ICD-10-CM | POA: Diagnosis not present

## 2019-01-07 NOTE — Progress Notes (Signed)
Physical Therapy Treatment Patient Details Name: [name] White MRN: [id number] DOB: 06/07/1956 Today's Date: 01/07/2019    History of Present Illness Pt is a 63 y.o. F with significant PMH of COPD, afib, PE who presents to ED with suicidal ideations and homelessness.    PT Comments    Pt with slow progression towards goals. Able to increase ambulation distance slightly, however, remains limited secondary to SOB. Required prolonged standing rest secondary to SOB; oxygen sats >93% on 2L during mobility. Current recommendations appropriate. Will continue to follow acutely to maximize functional mobility independence and safety.    Follow Up Recommendations  SNF     Equipment Recommendations  None recommended by PT    Recommendations for Other Services       Precautions / Restrictions Precautions Precautions: Fall Restrictions Weight Bearing Restrictions: No    Mobility  Bed Mobility               General bed mobility comments: Sitting EOB upon entry  Transfers Overall transfer level: Needs assistance Equipment used: Standard walker Transfers: Sit to/from Stand Sit to Stand: Min guard         General transfer comment: Min guard for safety to stand. Cues for safe hand placement.   Ambulation/Gait Ambulation/Gait assistance: Min guard Gait Distance (Feet): 30 Feet(with 1 standing rest in middle) Assistive device: Standard walker Gait Pattern/deviations: Step-to pattern;Step-through pattern;Decreased stride length;Trunk flexed Gait velocity: decreased   General Gait Details: Pt with wide BOS and increased SOB. Oxygen >93% on 2L, however, pt requiring 1 standing rest break secondary to SOB. Cues for pursed lip breathing.    Stairs             Wheelchair Mobility    Modified Rankin (Stroke Patients Only)       Balance Overall balance assessment: Needs assistance Sitting-balance support: No upper extremity supported;Feet supported Sitting  balance-Leahy Scale: Good     Standing balance support: Bilateral upper extremity supported;During functional activity Standing balance-Leahy Scale: Poor Standing balance comment: Reliant on UE support                             Cognition Arousal/Alertness: Awake/alert Behavior During Therapy: WFL for tasks assessed/performed Overall Cognitive Status: Within Functional Limits for tasks assessed                                        Exercises      General Comments        Pertinent Vitals/Pain Pain Assessment: No/denies pain    Home Living                      Prior Function            PT Goals (current goals can now be found in the care plan section) Acute Rehab PT Goals Patient Stated Goal: get stronger PT Goal Formulation: With patient Time For Goal Achievement: 01/13/19 Potential to Achieve Goals: Fair Progress towards PT goals: Progressing toward goals    Frequency    Min 3X/week      PT Plan Current plan remains appropriate    Co-evaluation              AM-PAC PT "6 Clicks" Mobility   Outcome Measure  Help needed turning from your back to your side while in a  flat bed without using bedrails?: None Help needed moving from lying on your back to sitting on the side of a flat bed without using bedrails?: A Little Help needed moving to and from a bed to a chair (including a wheelchair)?: A Little Help needed standing up from a chair using your arms (e.g., wheelchair or bedside chair)?: A Little Help needed to walk in hospital room?: A Little Help needed climbing 3-5 steps with a railing? : Total 6 Click Score: 17    End of Session Equipment Utilized During Treatment: Oxygen Activity Tolerance: Patient limited by fatigue Patient left: in bed;with call bell/phone within reach(sitting EOB ) Nurse Communication: Mobility status PT Visit Diagnosis: Unsteadiness on feet (R26.81);Muscle weakness (generalized)  (M62.81);Difficulty in walking, not elsewhere classified (R26.2)     Time: 0981-1914 PT Time Calculation (min) (ACUTE ONLY): 23 min  Charges:  $Gait Training: 23-37 mins                     Leighton Ruff, PT, DPT  Acute Rehabilitation Services  Pager: [phone] Office: [phone]    Rudean Hitt 01/07/2019, 4:34 PM

## 2019-01-07 NOTE — ED Notes (Signed)
Breakfast tray ordered 

## 2019-01-07 NOTE — ED Provider Notes (Signed)
Daily Rounding.  Please see previous provider for full H&P.  In short,  [name] White is a 63 y.o. female presents for shortness of breath. Patient was medically cleared and evaluated by [facility] due to anxiety, depression, and suicidal thoughts.   Physical Exam  BP (!) 146/94 (BP Location: Right Wrist)   Pulse 91   Temp 98.7 F (37.1 C) (Oral)   Resp 18   SpO2 98%   Physical Exam Vitals signs and nursing note reviewed.  Constitutional:      General: She is not in acute distress.    Appearance: She is well-developed.     Comments: Patient is sitting up in bed in no acute distress.   HENT:     Head: Normocephalic and atraumatic.  Eyes:     General: No scleral icterus.       Right eye: No discharge.        Left eye: No discharge.     Conjunctiva/sclera: Conjunctivae normal.  Pulmonary:     Effort: Pulmonary effort is normal.  Neurological:     Mental Status: She is alert.     ED Course/Procedures     Procedures  MDM  CSW is working on placement in either SNF or shelter.        Darlin Drop [place], [place] 01/07/19 1222    Isla Pence, MD 01/07/19 1241

## 2019-01-07 NOTE — ED Notes (Signed)
Pt stated that she "did not feel well" and wanted BP taken. Vitals were reassessed. Informed Emmy - RN.

## 2019-01-07 NOTE — ED Notes (Signed)
Lunch tray ordered 

## 2019-01-07 NOTE — Progress Notes (Addendum)
3pm: CSW has had multiple conversations with [name], LCSW from [facility] regarding how to best assist this patient. CSW spoke with Lowella Petties, RN who allowed the patient to contact [facility] directly for discussions.  [name] informed CSW that patient had previously been given resources that she had not followed up on. [name] and patient discussed further options for placement including the patient taking responsibility for herself and making attempts to contact her other social workers ([employer] and Insurance account manager) for assistance with emergency housing.  CSW met with patient at bedside to discuss options with her. Patient was pleasant and receptive to [place] visit. CSW explained to patient about the resources that were given by [name] for the patient. Patient agreeable to reaching out to possible options for placement to discuss with them her situation. CSW provided patient with four list of resources including [facility], [place], [facility] in [place], and a flyer for [place] residential community for her review. CSW stressed to patient that though a discharge to a shelter is not ideal, it could be possible due to lack of acceptance into a SNF. Patient stated understanding and stated she will make calls to help herself.  CSW updated Emmy, Therapist, sports.  10:30am: CSW spoke with [name], LCSW of [facility] to discuss this patient. [name] reports that this patient was closed out for social work needs but was active with [facility]. CSW will meet with patient at bedside to discuss discharge options as she has been declined at several facilities.  [name], MSW, LCSW-A Clinical Social Worker [facility] [phone]

## 2019-01-07 NOTE — Patient Outreach (Addendum)
[facility]) Care Management  01/07/2019  [name] White 1955/08/20 [id number]   CSW received a very lengthy voicemail message from patient yesterday, Sunday, January 06, 2019, at 1:50PM, voicing a great deal of concern about feeling as though she is being "put out" of the hospital by hospital staff.  In her message to [place], patient admitted that she was very distraught and planned to voice her concerns to "higher ups".  Patient stated, "I have been holding in the [place] Unit of the Emergency Department for 13 days and the first time I was visited by a social worker was on last Friday, after having been here for 10 days".  Patient went on to say, "Prior to that, I had no idea about what was going on with regards to my placement, but was told that they were working on it".  Last, patient reported, "Now they say I've got to go, because they can't find no where for me, and want to try and put me in a shelter".  CSW made an attempt to try and contact patient first thing this morning, but receiving voicemail.  A HIPAA compliant message was left and CSW is currently awaiting a return call.  CSW performed a thorough review of patient's chart, since presenting to the Emergency Department on Wednesday, December 26, 2018, noting that several inpatient hospital social workers have been involved with patient's care, trying to pursue placement options for patient in a skilled nursing facility.  According to [name] White, Transitions of Care Clinical Social Worker with the Care Coordination Department, patient has been declined for assisted living placement, as well as skilled nursing placement, due to the following barriers: 1)  Suicidal complaints being seen as a risk for staff.  2)  Homelessness being seen as a risk for staff, due to patient not having a safe discharge plan upon release from the facility. 3)  Patient requiring psychiatric treatment and clearance. 4)  Patient's medical needs  are too complex.  CSW further noted that the inpatient hospital social workers have also outreached to patient's daughter, [name] White, as well as patient's sister, [name] White to see if they could be of assistance with regards to housing for patient.  [name] admitted that she is unable to accept patient into her home, having just learned that someone that currently resides there recently tested positive for COVID-19, putting patient at great risk.  [name] denied patient being able to come live with her, indicating that there is no reason why patient should not have a place to live, considering all the leads [name] has provided patient in the past, especially for places that patient could have definitely afforded, had she put for the effort.  [name] verbalized that she is unwilling to help patient, if patient is unwilling to help herself, but rather "sit around and wait for others to help her".  [name] indicated that she had made arrangements for patient to come live near her in [place], [place], prior to the pandemic.  CSW was able to make contact with [name] White, [place] Emergency Department Clinical Social Worker, to discuss patient's case at length, as well as to try and develop an appropriate discharge plan of care for patient.  CSW explained to [name] White that [facility] had closed patient's case on Wednesday, December 12, 2018, due to patient being actively involved with two additional community social workers, Insurance underwriter with [place] and [name] White  with [place] for [place].  CSW assisted patient with completing an application for Section 8 Housing and submitting it for processing through the [employer].  CSW also tried to get patient placed at [facility], [place], to receive short-term rehabilitative services, without success.  [name] White, Admissions  Director at [facility] explained to [place] that their facility was not accepting any patient's from home, that patient would first need to be hospitalized and cleared of COVID-19.   CSW explained to [name] White that [place] is more than happy to assist her and her colleagues with housing arrangements for patient, but fears that all resources have been exhausted.  CSW inquired as to whether or not [name] White thought that patient would be an appropriate candidate for funding through [place] or the [employer], encouraging her to converse with [name] White, Licensed Clinical Social Occupational psychologist.  [name] White reported that she would meet with patient, assess the situation and familiarize herself with the case, then return CSW's call later this afternoon.  In the meantime, CSW has left two HIPAA compliant messages for patient on voicemail, encouraging patient to contact CSW at her earliest convenience.  Patient was also encouraged to contact her Medicaid Case Worker, at the [place], to request emergency housing, which patient had already been encouraged to do in the past.  In addition, patient was encouraged to contact [name], as well as [name] to obtain an update on their efforts to pursue housing options for patient.  Patient has been provided a list of [employer], [place] for [employer] and [employer]  [name] White, [name] White, [name] White, [name] White  Licensed Education officer, environmental Health System  Mailing [place] [address] [place], [place] [address] Physical Address-[address], [place], [place] [address] Toll Free Main # [phone] Fax # [phone] Cell # [phone]  Office # [phone] [name]    Addendum: Patient was able to make contact with CSW, via the  assistance of the [name] White.  CSW inquired as to whether or not patient has had an opportunity to follow-up with [name] White, Education officer, museum at [place] and/or [name] White, Owner at [employer] for [place].  Patient denied, reporting that she was under the impression that the staff in the emergency department were working on placement arrangements for her, not knowing that she needed to be in contact with anyone regarding housing.  Patient agreed to contact [name], as well as [name], as soon as the call was terminated, to check the status of their housing arrangements for patient.  In addition, patient agreed to contact her Medicaid Case Worker, at the [place], to explain that she is in desperate need of emergency housing, enlisting her assistance.  In the meantime, CSW agreed to email the following resources to patient, as well as [name] White: [place] for [place] in [place] in [place] also agreed to begin calling housing resources and adult care homes, on patient's behalf, to try and pursue bed offers for patient.  Patient explained that she would follow-up with CSW as soon as she is able to make contact with [name], [name] and her Medicaid Case Worker, to  report findings of her conversations.

## 2019-01-07 NOTE — Progress Notes (Signed)
CSW checked in with patient. CSW noted patient report that the independent living program in [place] approved to pay her security deposit for an apartment (or other independent living) and patient had called several of the senior living options provided to her earlier in the day. CSW noted per patient report she has not heard back. CSW is following for discharge support.  Lamonte Richer, LCSW, [place] Worker II [phone]

## 2019-01-07 NOTE — ED Notes (Signed)
Breakfast tray arrived  

## 2019-01-07 NOTE — Care Management (Signed)
ED CM met with patient in room, patient reports that she has made contact with an agency in [place] for senior living who will aid in first month rent and security deposit, but patient has to find the apartment.  Patient states she has been making calls all day and may have a lead. CM discussed that this may take some time and asked does she have a safe place while waiting, also discussed transitional housing (motel) while waiting.  She said she will contact [employer] in the am.  [facility] team will  Follow up with her tomorrow for transitional care support.

## 2019-01-07 NOTE — ED Notes (Signed)
Dinner tray ordered.

## 2019-01-07 NOTE — ED Notes (Signed)
Dinner tray arrived 

## 2019-01-07 NOTE — ED Notes (Signed)
Pt given water, Ginger Ale, and graham crackers. Pt also given no rinse body wash and lotion. Lowella Petties - RN aware.

## 2019-01-07 NOTE — ED Notes (Signed)
PT is with the pt

## 2019-01-07 NOTE — ED Notes (Signed)
Graham crackers and ginger ale given to pt ?

## 2019-01-08 ENCOUNTER — Ambulatory Visit: Payer: Self-pay | Admitting: Pharmacist

## 2019-01-08 ENCOUNTER — Other Ambulatory Visit: Payer: Self-pay | Admitting: Pharmacist

## 2019-01-08 ENCOUNTER — Other Ambulatory Visit: Payer: Self-pay | Admitting: *Deleted

## 2019-01-08 DIAGNOSIS — R0600 Dyspnea, unspecified: Secondary | ICD-10-CM | POA: Diagnosis not present

## 2019-01-08 NOTE — ED Notes (Signed)
Pt aaox4  Waiting on SW for home plaCEMENT ate breakfast has walked at bedside

## 2019-01-08 NOTE — ED Notes (Signed)
Pt resting comfortably at this time.

## 2019-01-08 NOTE — ED Notes (Signed)
Pt spoke with [name] LCSW about house funding update. Pt will speak with her in the morning in regards to finding low income housing.

## 2019-01-08 NOTE — Discharge Summary (Signed)
EDCM spoke with pt at bedside regarding progression of disposition plan.  Pt states she is awaiting packet from [employer] to determine her voucher limitations.  EDCM will continue to follow pt for disposition needs.  EDCM provided pt with fax number [phone]) for packet transmitting.

## 2019-01-08 NOTE — ED Notes (Signed)
Pt eating lunch

## 2019-01-08 NOTE — ED Notes (Signed)
Pt up to shower with assist linens changed

## 2019-01-08 NOTE — Discharge Planning (Signed)
EDCM and EDSW met with pt at bedside regarding disposition progression.  Pt states she is awaiting [employer] packet to determine her dollar amount for housing.  EDCM contacted Domenick Gong ([employer]) for housing resources for this pt.  Ferman Hamming states she will reach out to her contacts and notify [facility] team with results,  TOC will continue to follow.

## 2019-01-08 NOTE — Patient Outreach (Addendum)
[facility]) Care Management  01/08/2019  TIMBERLYNN KIZZIAH 1955-07-09 [id number]  Planned to follow up with the patient today. Unfortunately, she is currently hospitalized.  Spoke with Social Worker, Product manager who is working on getting the patient placed in [place] post discharge.  Plan: Close patient's pharmacy case as she will be placed post discharge. Happy to reopen the patient's pharmacy case upon request or patient need.  Elayne Guerin, PharmD, [place] Clinical Pharmacist [phone]

## 2019-01-08 NOTE — Patient Outreach (Signed)
[name]) Care Management  01/08/2019  [name] White 1956-01-04 [id number]   CSW received a call from [name], Transitions of Care Case Manager, requesting assistance with housing resources for patient.  [name] was able to meet with patient at bedside this morning to obtain information about patient's progress with regards to pursuing housing options.  Patient reported that she was granted a voucher, through [employer], and that she is just waiting to learn of the dollar amount that she has been granted.  This voucher will pay for patient's deposit, as well as the first months rent, once patient is able to locate an apartment within her price range.  [name] provided CSW with a number to easily access patient on the Psychiatric Ward of the Emergency Department at [facility] [phone]).  CSW was able to make contact with patient to see if she has been able to make any progress with regards to finding an affordable apartment and one that is located in [facility].  Patient denied, indicating that she is not sure of the dollar amount that she has been approved to spend, waiting to hear back from [name] [phone]), Social Worker with [employer] for [employer].  In talking with [name], he reported that his agency does not provide clients with a list of affordable apartments, that is up to the client to choose an apartment, based on their monthly [place].  [name] went on to explain that patient will need to stay within her price range, because patient will be responsible for paying the monthly rent every month thereafter.    [name] explained that most apartment complexes accept clients on the 3-to-1 ratio, meaning that if patient makes $1,500.00 per month in [place] then she will need to find an apartment within the $500.00 per month price range.  [name] encouraged CSW to  call him back once patient has decided on an apartment complex, as he will contact the landlord of the apartment complex to secure patient's first months rent, as well as pay the entire deposit fee.  [name] indicated that he has received several calls from [name] [phone]), Social Worker with [place], regarding housing for patient, encouraging CSW to follow-up with [name] to see if she is able to provide any leads.  CSW spoke with [name] at length, who reported that she has been unable to locate an affordable apartment for patient, believing that CSW will run into the same dilemma.  [name] indicated that the reason being is because of COVID-19.  With the [employer] placing a hold on all evictions, landlords are unable to evict tenants for not paying rent; therefore, there has been no turnover within apartment complexes.  CSW is aware that landlords are able to go to the [facility], beginning on January 20, 2019, to request payment for full months rent, or provide the tenant with a 30-day eviction notice, but vacancy will not begin to come available until the end of August, depending on whether or not landlords are proactive in taking their stance.    CSW will contact the [employer] to check the status of patient's Section 8 Housing application, but again, the problem remains the same.  CSW was able to research low-income apartment communities in [place], emailing patient and [name] [phone]), [facility] Social Worker, with 9 different choices for patient to research.  Patient was very specific with her request, reporting that she is looking for a one bedroom or studio apartment, on the first floor, that is handicapped accessible, heated and cooled by electricity only, with certain amenities, such as water included in rent.  CSW has charged patient with the task of contacting  these apartment communities to check availability, that match her exact description.  CSW will also make several calls to check availability.    CSW is aware that patient does not have any money at present, nor does patient receive her next [place] check until the first week in August.  However, Mrs. Gaspar Bidding agreed to provide patient with financial assistance to purchase food and toiletries, until patient receives her next check.  [place] for [name] has recently closed due to funding issues, so this resources is no longer an option.  Patient attending nurse, [name] [phone]) requested that CSW follow-up with her directly, as patient tends to give conflicting stories.  Mrs. Roughgarden had already left for the day when CSW finally had an opportunity to call her back, but CSW was able to speak with patient's attending nurse, as well as patient, to recite all of the above information. CSW agreed to follow-up with patient on Wednesday, January 09, 2019, around 11:00AM, per patient's request.  [name], BSW, MSW, [place]  Licensed Clinical Social Worker  [employer]  Mailing [place]. [address] [place], [place] [address] Physical Address-[address], [place], [place] [address] Toll Free Main # [phone] Fax # [phone] Cell # [phone]  Office # [phone] [name]

## 2019-01-08 NOTE — Care Management (Addendum)
ED CM spoke with Nathaniel Man CSW AD concerning patient being accepted into the [place] will follow up in the morning with the arrangements.  Updated ED Charge Nurse.

## 2019-01-08 NOTE — ED Notes (Signed)
SW into speak about about her quest to find a place to live pt states she cannot go to live with her daughter in [place] because the air is not good there for her and she cannot go live with her sister  In [place]  ( who she states is a nurse)because she has a 2 story  " open  Concept house and she cannot get around in it pt states she is waiting on some one to pay for her to be in a hotel for 2 weeks so and then someone is going to help into an apt

## 2019-01-08 NOTE — ED Notes (Signed)
S/W [name] White spoke to pt about her progress in finding a place to live

## 2019-01-08 NOTE — ED Provider Notes (Cosign Needed)
Pt reports she feels well this am.  No complaints.    Fransico Meadow, [place] 01/08/19 [phone]

## 2019-01-08 NOTE — ED Notes (Signed)
SW Arbie Cookey and [place] into speak to  Pt and give her info pt sitting up in chair

## 2019-01-08 NOTE — Progress Notes (Addendum)
2pm: CSW and RN CM met with patient at bedside again to further discussion for discharge. Patient reports that she was able to speak with her [place] who is assisting patient with housing post discharge. [name] emailed CSW additional housing resources that were printed and provided to patient.  11am: CSW and RN CM met with patient at bedside to complete discussion regarding plan for discharge. Patient reports that she has made phone calls to various housing communities and to an agency in [place] for [employer] for assistance with housing.  CSW and RN CM spoke with Anner Crete, homeless advocate to obtain further guidance on how to best assist patient. Anner Crete to reach out to patient's [facility] social worker and will return call to CSW/RN CM.  [name], MSW, LCSW-A Clinical Social Worker [facility] [phone]

## 2019-01-09 ENCOUNTER — Other Ambulatory Visit: Payer: Self-pay | Admitting: *Deleted

## 2019-01-09 ENCOUNTER — Other Ambulatory Visit: Payer: Self-pay | Admitting: Family Medicine

## 2019-01-09 ENCOUNTER — Telehealth: Payer: Self-pay | Admitting: Surgery

## 2019-01-09 ENCOUNTER — Other Ambulatory Visit (HOSPITAL_COMMUNITY): Payer: Self-pay | Admitting: Emergency Medicine

## 2019-01-09 DIAGNOSIS — R0600 Dyspnea, unspecified: Secondary | ICD-10-CM | POA: Diagnosis not present

## 2019-01-09 NOTE — Discharge Planning (Signed)
EDCM provided taxi voucher for transportation to [facility] 6 room 127.

## 2019-01-09 NOTE — ED Provider Notes (Signed)
  Physical Exam  BP 129/67 (BP Location: Right Arm)   Pulse 86   Temp 97.9 F (36.6 C) (Oral)   Resp 15   SpO2 96%   Physical Exam  ED Course/Procedures     Procedures  MDM  Patient has been in the ED for over 350 hours.  Patient initially came here for shortness of breath and anxiety and has been cleared medically as well as by psychiatry.  Patient has complicated living arrangement and social work and case management has been involved.  I was informed by the nurse that social work is able to find a place for her to stay and therefore she is stable for discharge.        Drenda Freeze, MD 01/09/19 1600

## 2019-01-09 NOTE — ED Notes (Signed)
Pt has lunch tray at bedside  

## 2019-01-09 NOTE — Discharge Planning (Signed)
[facility] met with pt at bedside concern next steps.  Pt mentioned that her DME supplies are at [employer].  EDCM will work on getting pt DME to discharging residence.

## 2019-01-09 NOTE — ED Notes (Addendum)
Breakfast tray ordered 

## 2019-01-09 NOTE — ED Notes (Signed)
All of patient's belongings returned to patient including medications from pharmacy.  Pt had no belongings with security.

## 2019-01-09 NOTE — ED Notes (Signed)
Patient verbalizes understanding of discharge instructions. Opportunity for questioning and answers were provided. Armband removed by staff, pt discharged from ED.  

## 2019-01-09 NOTE — Discharge Planning (Signed)
[facility] met with pt at bedside regarding disposition progression.  Pt states she is excited about the possibility of the [facility] so she can work on healing and getting back on her feet.  TOC will continue to follow for disposition needs.

## 2019-01-09 NOTE — Discharge Instructions (Addendum)
Take your medicines as prescribed   See your medical doctor and psychiatrist   Return to ER if you have worse shortness of breath, thoughts of harming yourself or others

## 2019-01-09 NOTE — ED Notes (Addendum)
Patient belongings returned from locker and medications from pharmacy.  No documentation of items in security per security. Pt has cell phone and wallet.

## 2019-01-09 NOTE — Care Management (Signed)
ED CM contacted [facility] to verifiy [facility] services  And to send orders to resume [facility] service. Met with patient to review the transitional plan. Patient is being discharged to [facility] as per 1st shift EDCM. Patient will be transported to [facility] by taxi voucher is on [employer].  CM verified patient's phone number and confirmed that [facility] will contact her tomorrow to resume [facility] services. Orders for resumptions of [place] services called and faxed to [facility].  Patient is extremely appreciative for the assistance. [facility] SW will and [facility] will follow up with patient in the community. Updated RN on [place] of discharge. No further ED CM needs identified.

## 2019-01-09 NOTE — Telephone Encounter (Signed)
ED CM noted HH orders were not in record. Contacted Dr. Darl Householder and attempted to walk him through placing orders for resumption of [facility] services.. CM will fax orders to [facility] after hours office.  CM will follow up tomorrow.

## 2019-01-09 NOTE — Patient Outreach (Addendum)
[place] [name]) Care Management  01/09/2019  [name] Nov 10, 1955 [id number]   In [name] White for patient, patient was charged with the task of contacting the following low-income apartment communities to check availability:  1)  [place] 2)  [place] 3)  [place] 4)  [place] 5)  [place] 6)  [place] 7)  [place] 8)  [employer]  In the meantime, CSW agreed to contact the following low-income apartment communities to check availability:  1)  [place]  2)  [place] 3)  [place] 4)  Company secretary 5)  [place] 6)  The International aid/development worker at [employer] 7)  [place] 8)  [place] 9)  [place] 10) [place] 11) [place] 12) [place] 13) [place] 14) [place] 15) [place] 16) [place] 17) [place] 18) [place] 19) [place] 20) [place]  Unfortunately, out of the 20 places that [name] White contacted, not a single apartment complex has any availability for a studio or one-bedroom apartment, on the first floor, that is handicapped accessible.  CSW will await a return call from patient, in hopes that she has been more successful.  In the meantime, CSW received a call from [name], [phone]) Transitions of Care Case Manager with [facility], reporting that [name] White, Licensed Clinical Social Occupational psychologist, is working on trying to get patient approved for the [employer].  [name] White inquired as to whether or not [facility] would be able to provide patient with enough financial assistance to purchase food and toiletries for the next two weeks.  CSW explained to [name] White that CSW has been able to secure funds through [employer] [phone]), Social Worker with [place], in the form of gift cards, for patient to receive enough financial assistance for food and toiletries for 2 weeks.  [name] White indicated that she can either mail the gift cards to patient or patient/representative can pick up the gift cards on patient's behalf.  CSW will wait to learn of patient's discharge disposition and whether or not she has been approved for the [employer] before making arrangements to obtain the gift cards for patient.  [name] White, BSW, MSW, LCSW  Licensed Education officer, environmental Health System  Mailing [place] [address], [place], [place] [address] Physical Address-[address], [place], [place] [address] Toll Free Main # [phone] Fax # [phone] Cell # [phone]  Office # [phone] [name]    Addendum: CSW was able to obtain all of patient's personal belongings, as well as patient's oxygen tank and concentrator, from the [employer] [facility].  CSW then drove to [place] and met with [name] White to pick up patient's [facility] gift cards, totally $100.00.  CSW met with [name] White, General Manager at [facility] to secure patient a motel room (127) on the ground floor.  CSW was able to converse with [name] White, Assistant Coordinator with [place], via telephone, to receive credit card information and make the transaction.  [name] White approved patient for a two week stay at [facility], totally $613.62.  [name] White agreed to email [name] White a copy of the receipt so that [name] White did not have to make special arrangements to drive to Motel 6.  CSW delivered all of  patient's belongings to [facility], along with 100 dollars worth of groceries and toiletries.  CSW placed a request of [name] White for patient receive a larger refrigerator/freezer combo, as patient will need at least two weeks worth of food because she  is unable to perform her own grocery shopping, nor does patient have transportation.  CSW is aware that [name] White provided patient with a taxi voucher to transport patient from the Emergency Department at [facility] to [place] [facility].  CSW made contact with patient to explain all of the above information, as well as to inform patient that her key would be waiting for her at the front desk.  CSW requested that [name] White have one of her maintenance  workers assist patient with getting her bedside commode and wheelchair from the taxi into the motel room.  CSW arranged patient's belongings neatly, turned on the air conditioning unit and put patient's perishable food items into the refrigerator and freezer.  All dry products and canned goods where place on the counter so that patient would have easy access.  CSW encouraged patient to contact CSW, at least 3 days prior to her running out of food, so that [place] can make arrangements to have meals delivered, either through the [facility] Well Dine Program or through [employer] through [employer] of [place].  CSW will continue to work with patient on trying to pursue a more permanent housing solution.  Patient agreed to contact her daughter in [place], [place], [name] White to see if she would also be willing to assist CSW and patient with pursing housing White.  Patient will need to decide whether she wants to continue to reside in [place], [place] versus going to live near Mrs. Colbert in [place].  CSW agreed to follow-up with patient again next week, on Thursday, January 17, 2019, around 9:00AM.

## 2019-01-10 ENCOUNTER — Telehealth: Payer: Self-pay | Admitting: *Deleted

## 2019-01-10 ENCOUNTER — Encounter (HOSPITAL_COMMUNITY): Payer: Self-pay | Admitting: Emergency Medicine

## 2019-01-10 NOTE — Telephone Encounter (Signed)
TOC  CM received call from [facility] rep, Butch Penny requesting info on [facility] services ordered. Unsigned ambulatory referral for HHPT in chart, sent message to Dr Darl Householder to sign. [facility] will follow for HHPT.Jonnie Finner RN CCM Case Mgmt phone [phone]

## 2019-01-11 NOTE — Congregational Nurse Program (Signed)
  Dept: [facility] Nurse Program Note  Date of Encounter: 01/10/2019  Past Medical History: Past Medical History:  Diagnosis Date  . Anxiety   . Asthma   . Chronic lower back pain   . COPD (chronic obstructive pulmonary disease) ([place])   . Depression   . Family history of anesthesia complication    "daughter; causes her to pass out afterwards"  . GERD (gastroesophageal reflux disease)   . Hypertension   . Hyperthyroidism   . Migraine    "monthly" (12/28/2013)  . Osteoarthritis    "both knees; back of my neck; right pelvic bone" (12/28/2013)  . Paroxysmal A-fib ([place])   . Pulmonary embolism ([place]) 12/28/2013   "2 clots in each lung"    Encounter Details: CNP Questionnaire - 01/10/19 1130      Questionnaire   Patient Status  Not Applicable    Race  Black or African American    Location Patient Served At  [place] patient    [employer];Medicare    Uninsured  Not Applicable    Food  No food insecurities    Housing/Utilities  No permanent housing    Transportation  Yes, need transportation assistance    Interpersonal Safety  Yes, feel physically and emotionally safe where you currently live    Medication  No medication insecurities    Medical Provider  Yes    Referrals  Not Applicable    ED Visit Averted  Not Applicable    Life-Saving Intervention Made  Not Applicable     Initial visit for pt. Discharged from hospital yesterday.  Reviewed HOPES program. Patient signed consent for program services. Patient stated she had no questions. Given $50 [employer] gift card. Ms. Treat is actively looking for housing alternatives. Patient denied any current medical concerns.  Using oxygen, mostly at night.  Has adequate food supplies. Will be followed by HOPES nurse and social worker.

## 2019-01-12 DIAGNOSIS — F419 Anxiety disorder, unspecified: Secondary | ICD-10-CM | POA: Diagnosis not present

## 2019-01-12 DIAGNOSIS — F329 Major depressive disorder, single episode, unspecified: Secondary | ICD-10-CM | POA: Diagnosis not present

## 2019-01-12 DIAGNOSIS — M545 Low back pain: Secondary | ICD-10-CM | POA: Diagnosis not present

## 2019-01-12 DIAGNOSIS — J449 Chronic obstructive pulmonary disease, unspecified: Secondary | ICD-10-CM | POA: Diagnosis not present

## 2019-01-12 DIAGNOSIS — I480 Paroxysmal atrial fibrillation: Secondary | ICD-10-CM | POA: Diagnosis not present

## 2019-01-12 DIAGNOSIS — R45851 Suicidal ideations: Secondary | ICD-10-CM | POA: Diagnosis not present

## 2019-01-12 DIAGNOSIS — I10 Essential (primary) hypertension: Secondary | ICD-10-CM | POA: Diagnosis not present

## 2019-01-12 DIAGNOSIS — M159 Polyosteoarthritis, unspecified: Secondary | ICD-10-CM | POA: Diagnosis not present

## 2019-01-12 DIAGNOSIS — G8929 Other chronic pain: Secondary | ICD-10-CM | POA: Diagnosis not present

## 2019-01-14 ENCOUNTER — Telehealth: Payer: Self-pay | Admitting: *Deleted

## 2019-01-14 NOTE — Telephone Encounter (Signed)
PT calling for [facility] verbal orders as follows:  1 time(s) weekly for 2 week(s), then 2 time(s) weekly for 2 week(s) then 1 time(s) weekly for 5 week(s)   You can leave verbal orders on confidential voicemail.  Christen Bame, CMA

## 2019-01-15 NOTE — Telephone Encounter (Signed)
Left verbal orders on confidential voicemail  [name] MD PGY-3 Family Medicine Resident

## 2019-01-17 ENCOUNTER — Encounter: Payer: Self-pay | Admitting: *Deleted

## 2019-01-17 ENCOUNTER — Other Ambulatory Visit: Payer: Self-pay

## 2019-01-17 ENCOUNTER — Other Ambulatory Visit: Payer: Self-pay | Admitting: *Deleted

## 2019-01-17 NOTE — Patient Outreach (Signed)
[place] [facility]) Care Management  01/17/2019  [name] White May 02, 1956 [id number]   Patient linked to [place]. Will receive 14 prepared meals on 01/19/19.  Ronn Melena, BSW Social Worker [phone]

## 2019-01-17 NOTE — Patient Outreach (Signed)
[place] [facility]) Care Management  01/17/2019  [name] White 1956/03/18 [id number]   CSW was able to make contact with patient today to follow-up regarding housing resources, as well as to ensure that patient still has enough food, due to inability to shop for groceries and lack of funds.  Patient admitted that she is in need of food, providing CSW with a grocery list of items that patient would like for CSW to purchase for her.  CSW explained to patient that CSW has referred patient to [place] for ongoing food insecurities.  CSW further explained to patient that meals should begin being delivered within the next 24-48 hours.  Patient stated, "I should definitely have enough food to last me until then, but there are certain things that I would just like to have".  CSW inquired about patient's housing search and whether or not she has been able to make any progress toward finding more permanent housing, reminding patient that she is only paid up at [facility] until next Wednesday, January 23, 2019.  Patient stated, "I am still working on it".  CSW reminded patient that there may not be any additional financial assistance available to her through [name] with Congregational Nursing, Darryl Lent through [place] and/or CSW with [facility].  CSW encouraged patient to continue to contact low-income housing communities from the list provided to her by CSW.  Patient requested that CSW make arrangements for her to receive in-home care aide services, indicating that she really needs assistance with bathing, meal preparation, transportation, grocery shopping, light-housekeeping duties, etc.  CSW agreed to contact Venia Minks, Representative with the [place] # [phone], to request that she email CSW the following application:  [place] (PCS) ATTESTATION OF MEDICAL NEED.  CSW will complete a section of the application and then fax to patient's Primary Care Physician, Dr. [name] for review and signature.  Once the application has been completed and signed by Dr. Kris Mouton, [name] will then request that Dr. Dorise Bullion nurse fax the application directly to [employer] for processing.  A home assessment will then be performed with patient to determine eligibility.  CSW explained to patient that it may be difficult for her to receive services without a permanent place of residence, but that [place] would pursue services anyway.  CSW agreed to follow-up with patient as soon as the application has been submitted to [employer].  Otherwise, CSW will make arrangements to follow-up with patient again next week, on Tuesday, January 22, 2019, around 10:00AM.   [name], BSW, MSW, [employer]  Licensed Clinical Social Worker  [place]  Mailing [name] [place], [place] [address] Physical Address-[address], [place], [place] [address] Toll Free Main # [phone] Fax # [phone] Cell # [phone]  Office # [phone] [name]

## 2019-01-18 DIAGNOSIS — J449 Chronic obstructive pulmonary disease, unspecified: Secondary | ICD-10-CM | POA: Diagnosis not present

## 2019-01-18 DIAGNOSIS — M545 Low back pain: Secondary | ICD-10-CM | POA: Diagnosis not present

## 2019-01-18 DIAGNOSIS — M159 Polyosteoarthritis, unspecified: Secondary | ICD-10-CM | POA: Diagnosis not present

## 2019-01-18 DIAGNOSIS — I10 Essential (primary) hypertension: Secondary | ICD-10-CM | POA: Diagnosis not present

## 2019-01-18 DIAGNOSIS — G8929 Other chronic pain: Secondary | ICD-10-CM | POA: Diagnosis not present

## 2019-01-18 DIAGNOSIS — R45851 Suicidal ideations: Secondary | ICD-10-CM | POA: Diagnosis not present

## 2019-01-18 DIAGNOSIS — F419 Anxiety disorder, unspecified: Secondary | ICD-10-CM | POA: Diagnosis not present

## 2019-01-18 DIAGNOSIS — F329 Major depressive disorder, single episode, unspecified: Secondary | ICD-10-CM | POA: Diagnosis not present

## 2019-01-18 DIAGNOSIS — I480 Paroxysmal atrial fibrillation: Secondary | ICD-10-CM | POA: Diagnosis not present

## 2019-01-22 ENCOUNTER — Telehealth: Payer: Self-pay | Admitting: Internal Medicine

## 2019-01-22 ENCOUNTER — Other Ambulatory Visit: Payer: Self-pay | Admitting: *Deleted

## 2019-01-22 NOTE — Patient Outreach (Signed)
[name]) Care Management  01/22/2019  [name] 1956/01/02 [id number]   CSW was able to make contact with patient today to follow-up regarding housing resources, as well as to ensure that patient has plenty of food and toiletries at the [facility], where patient currently resides.  Patient has been linked to [place] COVID-19 [employer] and was scheduled to begin receiving 14 prepared meals on Saturday, January 19, 2019.  Patient is also aware that she is eligible to receive a total of 30 frozen meals through the [facility].  Patient was only paid up at [facility] until Wednesday, January 23, 2019, but reported that she was able to get her nurse from the [place] to approve her for an additional two week stay.  Patient is under the impression that she will be able to reside at [facility] until permanent housing arrangements are made.  Patient further reported that the social worker assigned to her, also through the [facility], is assisting her with finding suitable housing.  Patient admitted that she has been calling numerous 1/bd apartment communities in [place] and [place], but has been unsuccessful, thus far, in finding any vacancies.  Patient reported that she has spoken with [name], Social Worker with [employer] for [employer], and that his agency is willing to pay for patient to have a roommate.  Patient indicated that she has been looking for roommates, currently advertised on [place] agreed to follow-up with patient again next week, on Tuesday, January 29, 2019, around 10:00AM, to check the status of her housing situation.  CSW will also confirm that patient still has enough food and toiletries, making a referral to the [facility], if necessary.  Patient is aware that her application to [place] (PCS) ATTESTATION OF  MEDICAL NEED has been completed and faxed to her Primary Care Physician, Dr. Ron Agee office, for review and signature.  Patient has been encouraged to contact CSW directly if additional social work needs arise in the meantime.  [name], BSW, MSW, LCSW  Licensed Education officer, environmental Health System  Mailing [place] [address] [employer] [place], [place] [address] Physical Address-[address], [place], [place] [address] Toll Free Main # [phone] Fax # [phone] Cell # [phone]  Office # [phone] [name]

## 2019-01-22 NOTE — Telephone Encounter (Signed)
I spoke to the patient and had her reach out to her PCP, Dr Kris Mouton in regards to her Potassium medication.  I told her to call us if there were any questions.  She verbalized understanding.

## 2019-01-22 NOTE — Telephone Encounter (Signed)
New Message    Pt c/o medication issue:  1. Name of Medication: Potassium Chloride ER 20 MEQ TBCR  2. How are you currently taking this medication (dosage and times per day)? Two tablets twice daily  3. Are you having a reaction (difficulty breathing--STAT)? No  4. What is your medication issue? Patient states when she was in the hospital they took her off the medication stating her levels were normal.  Patient wants to know if she should continue the medication now.

## 2019-01-23 DIAGNOSIS — R45851 Suicidal ideations: Secondary | ICD-10-CM | POA: Diagnosis not present

## 2019-01-23 DIAGNOSIS — I10 Essential (primary) hypertension: Secondary | ICD-10-CM | POA: Diagnosis not present

## 2019-01-23 DIAGNOSIS — M545 Low back pain: Secondary | ICD-10-CM | POA: Diagnosis not present

## 2019-01-23 DIAGNOSIS — J449 Chronic obstructive pulmonary disease, unspecified: Secondary | ICD-10-CM | POA: Diagnosis not present

## 2019-01-23 DIAGNOSIS — M159 Polyosteoarthritis, unspecified: Secondary | ICD-10-CM | POA: Diagnosis not present

## 2019-01-23 DIAGNOSIS — I480 Paroxysmal atrial fibrillation: Secondary | ICD-10-CM | POA: Diagnosis not present

## 2019-01-23 DIAGNOSIS — G8929 Other chronic pain: Secondary | ICD-10-CM | POA: Diagnosis not present

## 2019-01-23 DIAGNOSIS — F329 Major depressive disorder, single episode, unspecified: Secondary | ICD-10-CM | POA: Diagnosis not present

## 2019-01-23 DIAGNOSIS — F419 Anxiety disorder, unspecified: Secondary | ICD-10-CM | POA: Diagnosis not present

## 2019-01-24 ENCOUNTER — Other Ambulatory Visit: Payer: Self-pay

## 2019-01-24 ENCOUNTER — Telehealth (INDEPENDENT_AMBULATORY_CARE_PROVIDER_SITE_OTHER): Payer: Medicare HMO | Admitting: Family Medicine

## 2019-01-24 DIAGNOSIS — E876 Hypokalemia: Secondary | ICD-10-CM

## 2019-01-24 DIAGNOSIS — N3941 Urge incontinence: Secondary | ICD-10-CM

## 2019-01-24 DIAGNOSIS — R232 Flushing: Secondary | ICD-10-CM

## 2019-01-25 ENCOUNTER — Emergency Department (HOSPITAL_COMMUNITY)
Admission: EM | Admit: 2019-01-25 | Discharge: 2019-01-25 | Disposition: A | Discharge status: Home / Self Care | Payer: Medicare HMO | Attending: Emergency Medicine | Admitting: Emergency Medicine

## 2019-01-25 ENCOUNTER — Encounter (HOSPITAL_COMMUNITY): Payer: Self-pay | Admitting: Emergency Medicine

## 2019-01-25 ENCOUNTER — Telehealth: Payer: Self-pay | Admitting: *Deleted

## 2019-01-25 ENCOUNTER — Other Ambulatory Visit: Payer: Self-pay

## 2019-01-25 DIAGNOSIS — R42 Dizziness and giddiness: Secondary | ICD-10-CM | POA: Diagnosis not present

## 2019-01-25 DIAGNOSIS — Z5321 Procedure and treatment not carried out due to patient leaving prior to being seen by health care provider: Secondary | ICD-10-CM | POA: Insufficient documentation

## 2019-01-25 DIAGNOSIS — I10 Essential (primary) hypertension: Secondary | ICD-10-CM | POA: Diagnosis not present

## 2019-01-25 DIAGNOSIS — R110 Nausea: Secondary | ICD-10-CM | POA: Diagnosis not present

## 2019-01-25 DIAGNOSIS — G8929 Other chronic pain: Secondary | ICD-10-CM | POA: Diagnosis not present

## 2019-01-25 DIAGNOSIS — F419 Anxiety disorder, unspecified: Secondary | ICD-10-CM | POA: Diagnosis not present

## 2019-01-25 DIAGNOSIS — M159 Polyosteoarthritis, unspecified: Secondary | ICD-10-CM | POA: Diagnosis not present

## 2019-01-25 DIAGNOSIS — F329 Major depressive disorder, single episode, unspecified: Secondary | ICD-10-CM | POA: Diagnosis not present

## 2019-01-25 DIAGNOSIS — M545 Low back pain: Secondary | ICD-10-CM | POA: Diagnosis not present

## 2019-01-25 DIAGNOSIS — R45851 Suicidal ideations: Secondary | ICD-10-CM | POA: Diagnosis not present

## 2019-01-25 DIAGNOSIS — I480 Paroxysmal atrial fibrillation: Secondary | ICD-10-CM | POA: Diagnosis not present

## 2019-01-25 DIAGNOSIS — J449 Chronic obstructive pulmonary disease, unspecified: Secondary | ICD-10-CM | POA: Diagnosis not present

## 2019-01-25 NOTE — ED Triage Notes (Signed)
Per [facility] pt was woke up by her home therapist/nurse and when sat up she was dizzy and felt SOB. Pt is on O2 2L continuous. Vitals: 148/96, 94HR, 16R, 98%-100% on 2L O2. CBG 111, temp 97.7. told EMS that she is feeling better.

## 2019-01-25 NOTE — Telephone Encounter (Signed)
Received call from home health nurse.  Pts BP is 180/100 and is experiencing dizziness and headache.  Advised to go to ED now.  Christen Bame, CMA

## 2019-01-25 NOTE — ED Notes (Signed)
Pt state that her BP is much better and she wants to go home. Pt signed leaving AMA.

## 2019-01-27 DIAGNOSIS — N3941 Urge incontinence: Secondary | ICD-10-CM | POA: Insufficient documentation

## 2019-01-27 NOTE — Progress Notes (Signed)
[place] Telemedicine Visit  Patient consented to have virtual visit. Method of visit: Telephone  Encounter participants: Patient: [name] White - located at home Provider: [name] - located at [facility] Others (if applicable): none  Chief Complaint: referral to urology  HPI: 63 year old female who presents with a variety of complaints. Prominently she is seeking a urology referral, wants to know if her potassium is low or high, wants to discuss hormone replacement therapy.  Patient states that she was taken off of her potassium during her last admission. She wants to know if she needs to restart this medication. The patient takes flecanide which is known to cause hypokalemia, which is why she was taking in the first place. She is having no symptoms such as chest pain, leg cramping, or malaise.  Patient states that since she had covid back in March 2020, she has been having issues with incontinence. She states that she will suddenly have the urge to urinate and has to run to the restroom. She states that she often has a very large volume diuresis.  Patient is interested in hormone replacement therapy. She has been having hot flashes and headaches. States that she was on it back in her 27s and it really helped her. S/P hysterectomy. Patient states that her daughter had breast cancer that was receptor positive in the past.  ROS: per HPI  Pertinent PMHx: atrial fibrillation  Exam:  General: pleasant, no acute distress Respiratory: no shortness of breath, able to speak in clear coherent sentences Psych: pleasant, coherent thought process  Assessment/Plan:  Hypokalemia Possible hypokalemia. Patient takes flecainide without recent repletion. Will get BMP, asked patient to set up lab appointment. Will likely restart pending results.  Hot flashes Discussed with patient that I strongly recommend against hormone replacement therapy. She still has her ovaries  and there is a family history of ER + breast cancer. I explained that this would increase her risk of estrogen sensitive cancer in these areas, and that the benefits do no outweigh the risks. Patient voiced understanding.  Urge incontinence Apparently started after she had covid back in late march. Does not have the typical hallmark of urge incontinence in that she often has a very large volume diuresis when she is able to go. Per patient request will refer to Urology for further management. - urology referral placed    Time spent during visit with patient: 22 minutes

## 2019-01-27 NOTE — Assessment & Plan Note (Signed)
Discussed with patient that I strongly recommend against hormone replacement therapy. She still has her ovaries and there is a family history of ER + breast cancer. I explained that this would increase her risk of estrogen sensitive cancer in these areas, and that the benefits do no outweigh the risks. Patient voiced understanding.

## 2019-01-27 NOTE — Assessment & Plan Note (Signed)
Apparently started after she had covid back in late march. Does not have the typical hallmark of urge incontinence in that she often has a very large volume diuresis when she is able to go. Per patient request will refer to Urology for further management. - urology referral placed

## 2019-01-27 NOTE — Assessment & Plan Note (Signed)
Possible hypokalemia. Patient takes flecainide without recent repletion. Will get BMP, asked patient to set up lab appointment. Will likely restart pending results.

## 2019-01-29 ENCOUNTER — Other Ambulatory Visit: Payer: Self-pay | Admitting: *Deleted

## 2019-01-29 NOTE — Patient Outreach (Signed)
[place] [facility]) Care Management  01/29/2019  [name] White Nov 17, 1955 [id number]   CSW was able to make contact with patient today to follow-up regarding permanent housing arrangements, as well as food insecurities.  Patient reported that she has been able to secure a roommate arrangement and that her social worker with the [place] is looking at various apartment communities on her behalf.  Patient has been able to narrow down her search to two apartment communities, and is currently filling out the necessary paperwork.  Patient has been approved for another week's stay at [facility], compliments of the [place].  Patient continues to work with [name], Social Worker with [employer], regarding payment of her first months rent, as well as the security deposit.  Patient's 14 prepared meals, through the [employer], were delivered on Saturday, January 19, 2019, which means that patient still has enough meals to last her until Saturday, February 02, 2019.  CSW reminded patient that she is also eligible to receive 30 frozen meals through the [facility].  Patient has been encouraged to notify CSW if she needs assistance with the referral process.  Patient voiced understanding, indicating that she is able to obtain additional [employer] gift cards from Darryl Lent, Education officer, museum with [place], to purchase food and toiletries.  Patient wants to save her [employer] Well-Dine meals for when she moves into her new apartment.  Patient is still awaiting a call from a representative with [employer], regarding her [place] (PCS) [place], faxed to [employer] by [place] on Wednesday, January 23, 2019.  The [employer] representative will  need to perform a telephone interview with patient to ensure eligibility.  Patient does not have to have a permanent place of residence in order to qualify for personal care services.  Patient has been encouraged to contact CSW directly, as soon as the telephone interview takes place.  Otherwise, CSW will make arrangements to follow-up with patient again in a few weeks, on Monday, February 11, 2019, around [place], BSW, MSW, [employer]  Licensed Clinical Social Worker  [place]  Mailing [place] [address], [place], [place] [address] Physical Address-[address], [place], [place] [address] Toll Free Main # [phone] Fax # [phone] Cell # [phone]  Office # [phone] [name]

## 2019-01-30 DIAGNOSIS — F329 Major depressive disorder, single episode, unspecified: Secondary | ICD-10-CM | POA: Diagnosis not present

## 2019-01-30 DIAGNOSIS — R45851 Suicidal ideations: Secondary | ICD-10-CM | POA: Diagnosis not present

## 2019-01-30 DIAGNOSIS — I10 Essential (primary) hypertension: Secondary | ICD-10-CM | POA: Diagnosis not present

## 2019-01-30 DIAGNOSIS — I480 Paroxysmal atrial fibrillation: Secondary | ICD-10-CM | POA: Diagnosis not present

## 2019-01-30 DIAGNOSIS — G8929 Other chronic pain: Secondary | ICD-10-CM | POA: Diagnosis not present

## 2019-01-30 DIAGNOSIS — J449 Chronic obstructive pulmonary disease, unspecified: Secondary | ICD-10-CM | POA: Diagnosis not present

## 2019-01-30 DIAGNOSIS — M545 Low back pain: Secondary | ICD-10-CM | POA: Diagnosis not present

## 2019-01-30 DIAGNOSIS — M159 Polyosteoarthritis, unspecified: Secondary | ICD-10-CM | POA: Diagnosis not present

## 2019-01-30 DIAGNOSIS — F419 Anxiety disorder, unspecified: Secondary | ICD-10-CM | POA: Diagnosis not present

## 2019-01-31 DIAGNOSIS — G8929 Other chronic pain: Secondary | ICD-10-CM | POA: Diagnosis not present

## 2019-01-31 DIAGNOSIS — J449 Chronic obstructive pulmonary disease, unspecified: Secondary | ICD-10-CM | POA: Diagnosis not present

## 2019-01-31 DIAGNOSIS — R45851 Suicidal ideations: Secondary | ICD-10-CM | POA: Diagnosis not present

## 2019-01-31 DIAGNOSIS — M545 Low back pain: Secondary | ICD-10-CM | POA: Diagnosis not present

## 2019-01-31 DIAGNOSIS — F419 Anxiety disorder, unspecified: Secondary | ICD-10-CM | POA: Diagnosis not present

## 2019-01-31 DIAGNOSIS — I480 Paroxysmal atrial fibrillation: Secondary | ICD-10-CM | POA: Diagnosis not present

## 2019-01-31 DIAGNOSIS — I10 Essential (primary) hypertension: Secondary | ICD-10-CM | POA: Diagnosis not present

## 2019-01-31 DIAGNOSIS — F329 Major depressive disorder, single episode, unspecified: Secondary | ICD-10-CM | POA: Diagnosis not present

## 2019-01-31 DIAGNOSIS — M159 Polyosteoarthritis, unspecified: Secondary | ICD-10-CM | POA: Diagnosis not present

## 2019-02-04 DIAGNOSIS — M545 Low back pain: Secondary | ICD-10-CM | POA: Diagnosis not present

## 2019-02-04 DIAGNOSIS — F329 Major depressive disorder, single episode, unspecified: Secondary | ICD-10-CM | POA: Diagnosis not present

## 2019-02-04 DIAGNOSIS — I10 Essential (primary) hypertension: Secondary | ICD-10-CM | POA: Diagnosis not present

## 2019-02-04 DIAGNOSIS — M159 Polyosteoarthritis, unspecified: Secondary | ICD-10-CM | POA: Diagnosis not present

## 2019-02-04 DIAGNOSIS — R45851 Suicidal ideations: Secondary | ICD-10-CM | POA: Diagnosis not present

## 2019-02-04 DIAGNOSIS — J449 Chronic obstructive pulmonary disease, unspecified: Secondary | ICD-10-CM | POA: Diagnosis not present

## 2019-02-04 DIAGNOSIS — G8929 Other chronic pain: Secondary | ICD-10-CM | POA: Diagnosis not present

## 2019-02-04 DIAGNOSIS — F419 Anxiety disorder, unspecified: Secondary | ICD-10-CM | POA: Diagnosis not present

## 2019-02-04 DIAGNOSIS — I480 Paroxysmal atrial fibrillation: Secondary | ICD-10-CM | POA: Diagnosis not present

## 2019-02-07 DIAGNOSIS — J9611 Chronic respiratory failure with hypoxia: Secondary | ICD-10-CM | POA: Diagnosis not present

## 2019-02-11 ENCOUNTER — Other Ambulatory Visit: Payer: Self-pay | Admitting: *Deleted

## 2019-02-11 NOTE — Patient Outreach (Signed)
[place] [facility]) Care Management  02/11/2019  [name] White 1956-03-28 [id number]   CSW made an attempt to try and contact patient today to follow-up regarding permanent housing arrangements, as well as food insecurities; however, patient was unavailable at the time of CSW's call.  CSW left a HIPAA compliant message for patient on voicemail and is currently awaiting a return call.  CSW will make a second outreach attempt within the next 3-4 business days, if a return call is not received from patient in the meantime.  [name], BSW, MSW, LCSW  Licensed Education officer, environmental Health System  Mailing [place] [address], [place], [place] [address] Physical Address-[address] [place], [place], [place] [address] Toll Free Main # [phone] Fax # [phone] Cell # [phone]  Office # [phone] [name]

## 2019-02-15 ENCOUNTER — Other Ambulatory Visit: Payer: Self-pay | Admitting: *Deleted

## 2019-02-15 NOTE — Patient Outreach (Signed)
[name]) Care Management  02/15/2019  [name] White [birth year] [id number]   CSW was able to make contact with patient today to follow-up regarding permanent housing arrangements, as well as assess for food insecurities.  Patient reported that she is no longer receiving financial assistance to pay for her continued stay at [facility].  Patient went on to say that [name], Nurse with the [facility], explained to her that they would no longer be able to provide financial assistance for housing, as of Thursday, February 07, 2019.  Since then, patient admitted that she has been paying for stay at [facility], through her [place], but at a much reduced rate.  Patient indicated that she is having an extremely difficult time trying to find housing during this pandemic, as no one is wanting to risk potential exposure.  Patient stated that she is on the waiting list for several housing communities, just waiting to receive a return call with regards to availability.  Patient continues to endorse that she is receiving financial assistance to obtain food and toiletries through [employer], Education officer, museum with [place].  Patient also continues to remain eligible for financial assistance for housing through [name], Education officer, museum with [employer].  Patient's meal delivery through [employer] ended on Saturday, February 02, 2019; therefore, CSW reminded patient that she is still eligible to receive a 30-day supply of frozen meals through the [employer].  Patient voiced understanding, but indicated that she wants to continue to hold off on this resource for when she really needs it.  Patient reported that she has performed her telephone assessment with a representative from [employer] regarding [employer]), but that she is still waiting for them to determine  eligibility.  CSW agreed to follow-up with patient again on Friday, February 22, 2019, around 9:00AM, to assess her situation.  [name], BSW, MSW, LCSW  Licensed Education officer, environmental Health System  Mailing [place] [address] [place], [place] [address] Physical Address-[address], [place], [place] [address] Toll Free Main # [phone] Fax # [phone] Cell # [phone]  Office # [phone] [name]

## 2019-02-19 DIAGNOSIS — F419 Anxiety disorder, unspecified: Secondary | ICD-10-CM | POA: Diagnosis not present

## 2019-02-19 DIAGNOSIS — I10 Essential (primary) hypertension: Secondary | ICD-10-CM | POA: Diagnosis not present

## 2019-02-19 DIAGNOSIS — M159 Polyosteoarthritis, unspecified: Secondary | ICD-10-CM | POA: Diagnosis not present

## 2019-02-19 DIAGNOSIS — F329 Major depressive disorder, single episode, unspecified: Secondary | ICD-10-CM | POA: Diagnosis not present

## 2019-02-19 DIAGNOSIS — I480 Paroxysmal atrial fibrillation: Secondary | ICD-10-CM | POA: Diagnosis not present

## 2019-02-19 DIAGNOSIS — G8929 Other chronic pain: Secondary | ICD-10-CM | POA: Diagnosis not present

## 2019-02-19 DIAGNOSIS — J449 Chronic obstructive pulmonary disease, unspecified: Secondary | ICD-10-CM | POA: Diagnosis not present

## 2019-02-19 DIAGNOSIS — M545 Low back pain: Secondary | ICD-10-CM | POA: Diagnosis not present

## 2019-02-19 DIAGNOSIS — R45851 Suicidal ideations: Secondary | ICD-10-CM | POA: Diagnosis not present

## 2019-02-21 ENCOUNTER — Other Ambulatory Visit: Payer: Self-pay | Admitting: *Deleted

## 2019-02-21 NOTE — Patient Outreach (Signed)
[facility]) Care Management  02/21/2019  [name] White 10-30-1955 [id number]   CSW received an incoming call from patient today, requesting that CSW provide her with financial assistance so that she can continue her stay at [facility].  Patient has been residing at the [facility] since Wednesday, January 09, 2019, but receiving financial assistance through the [employer]. The [place] stopped paying for patient's stay at [facility] on Thursday, February 07, 2019.  Since then, patient has been using her [place] to pay for her stay at [facility], but is now completely out of funds.  Patient reported that she does not know what she is going to do because she is unable to receive additional funding through [employer], Education officer, museum with [place], and that she has to be out of the motel by this evening if she is unable to come up with $42.82, for an additional one-night stay.  CSW encouraged patient to refer back to the list of housing resources provided to her by CSW in mid-July, and begin placing calls.  In the meantime, CSW agreed to do the same, by calling group homes, boarding homes, etc., to check availability.  CSW also submitted applications for patient to the [place] [employer] Rental/Utility Assistance Program.  CSW also encouraged patient to contact her case worker, at the [place], to request emergency housing assistance.  Patient voiced understanding and was agreeable to this plan.  Patient is aware that a representative from [place] will be contacting her directly with regards to eligibility.   Last, patient has been encouraged to request financial assistance from the list of [facility] that [place]  provided to her, as well as review the list of [place] [employer] and [employer], to check availability.  Patient reported that she has been denied [employer] Youth worker) through [employer].  CSW will follow-up with patient again next week, on Thursday, February 28, 2019, around 10:00AM, unless additional social work services and assistance is needed in the meantime.  [name], BSW, MSW, LCSW  Licensed Education officer, environmental Health System  Mailing [place] [address] [place], [place] [address] Physical Address-[address], [place], [place] [address] Toll Free Main # [phone] Fax # [phone] Cell # [phone]  Office # [phone] [name]

## 2019-02-22 ENCOUNTER — Ambulatory Visit: Payer: Self-pay | Admitting: *Deleted

## 2019-02-25 ENCOUNTER — Emergency Department (HOSPITAL_COMMUNITY): Payer: Medicare HMO

## 2019-02-25 ENCOUNTER — Observation Stay (HOSPITAL_COMMUNITY)
Admission: EM | Admit: 2019-02-25 | Discharge: 2019-02-28 | Disposition: A | Discharge status: Home / Self Care | Payer: Medicare HMO | Attending: Family Medicine | Admitting: Family Medicine

## 2019-02-25 ENCOUNTER — Other Ambulatory Visit: Payer: Self-pay

## 2019-02-25 DIAGNOSIS — I482 Chronic atrial fibrillation, unspecified: Secondary | ICD-10-CM | POA: Insufficient documentation

## 2019-02-25 DIAGNOSIS — G2581 Restless legs syndrome: Secondary | ICD-10-CM | POA: Diagnosis not present

## 2019-02-25 DIAGNOSIS — K219 Gastro-esophageal reflux disease without esophagitis: Secondary | ICD-10-CM | POA: Diagnosis not present

## 2019-02-25 DIAGNOSIS — M7989 Other specified soft tissue disorders: Secondary | ICD-10-CM | POA: Diagnosis not present

## 2019-02-25 DIAGNOSIS — G43909 Migraine, unspecified, not intractable, without status migrainosus: Secondary | ICD-10-CM | POA: Diagnosis not present

## 2019-02-25 DIAGNOSIS — I491 Atrial premature depolarization: Secondary | ICD-10-CM | POA: Insufficient documentation

## 2019-02-25 DIAGNOSIS — R42 Dizziness and giddiness: Secondary | ICD-10-CM | POA: Diagnosis not present

## 2019-02-25 DIAGNOSIS — I700 Atherosclerosis of aorta: Secondary | ICD-10-CM | POA: Diagnosis not present

## 2019-02-25 DIAGNOSIS — F332 Major depressive disorder, recurrent severe without psychotic features: Secondary | ICD-10-CM | POA: Diagnosis not present

## 2019-02-25 DIAGNOSIS — M1990 Unspecified osteoarthritis, unspecified site: Secondary | ICD-10-CM | POA: Insufficient documentation

## 2019-02-25 DIAGNOSIS — I4892 Unspecified atrial flutter: Secondary | ICD-10-CM | POA: Diagnosis not present

## 2019-02-25 DIAGNOSIS — R6889 Other general symptoms and signs: Secondary | ICD-10-CM | POA: Insufficient documentation

## 2019-02-25 DIAGNOSIS — Z86711 Personal history of pulmonary embolism: Secondary | ICD-10-CM | POA: Insufficient documentation

## 2019-02-25 DIAGNOSIS — Z590 Homelessness: Secondary | ICD-10-CM | POA: Insufficient documentation

## 2019-02-25 DIAGNOSIS — M25551 Pain in right hip: Secondary | ICD-10-CM | POA: Diagnosis not present

## 2019-02-25 DIAGNOSIS — Z7951 Long term (current) use of inhaled steroids: Secondary | ICD-10-CM | POA: Insufficient documentation

## 2019-02-25 DIAGNOSIS — Z87891 Personal history of nicotine dependence: Secondary | ICD-10-CM | POA: Insufficient documentation

## 2019-02-25 DIAGNOSIS — Z79899 Other long term (current) drug therapy: Secondary | ICD-10-CM | POA: Insufficient documentation

## 2019-02-25 DIAGNOSIS — W19XXXA Unspecified fall, initial encounter: Secondary | ICD-10-CM | POA: Diagnosis not present

## 2019-02-25 DIAGNOSIS — S8991XA Unspecified injury of right lower leg, initial encounter: Secondary | ICD-10-CM | POA: Diagnosis not present

## 2019-02-25 DIAGNOSIS — R062 Wheezing: Secondary | ICD-10-CM | POA: Diagnosis not present

## 2019-02-25 DIAGNOSIS — S0990XA Unspecified injury of head, initial encounter: Secondary | ICD-10-CM | POA: Diagnosis not present

## 2019-02-25 DIAGNOSIS — G4730 Sleep apnea, unspecified: Secondary | ICD-10-CM | POA: Insufficient documentation

## 2019-02-25 DIAGNOSIS — M25561 Pain in right knee: Secondary | ICD-10-CM | POA: Diagnosis not present

## 2019-02-25 DIAGNOSIS — Z609 Problem related to social environment, unspecified: Secondary | ICD-10-CM

## 2019-02-25 DIAGNOSIS — M25511 Pain in right shoulder: Secondary | ICD-10-CM | POA: Diagnosis not present

## 2019-02-25 DIAGNOSIS — J441 Chronic obstructive pulmonary disease with (acute) exacerbation: Principal | ICD-10-CM | POA: Diagnosis present

## 2019-02-25 DIAGNOSIS — M159 Polyosteoarthritis, unspecified: Secondary | ICD-10-CM

## 2019-02-25 DIAGNOSIS — Z7901 Long term (current) use of anticoagulants: Secondary | ICD-10-CM | POA: Diagnosis not present

## 2019-02-25 DIAGNOSIS — I4891 Unspecified atrial fibrillation: Secondary | ICD-10-CM | POA: Diagnosis not present

## 2019-02-25 DIAGNOSIS — Z6841 Body Mass Index (BMI) 40.0 and over, adult: Secondary | ICD-10-CM | POA: Diagnosis not present

## 2019-02-25 DIAGNOSIS — R0602 Shortness of breath: Secondary | ICD-10-CM | POA: Diagnosis not present

## 2019-02-25 DIAGNOSIS — Z20828 Contact with and (suspected) exposure to other viral communicable diseases: Secondary | ICD-10-CM | POA: Insufficient documentation

## 2019-02-25 DIAGNOSIS — I10 Essential (primary) hypertension: Secondary | ICD-10-CM | POA: Insufficient documentation

## 2019-02-25 DIAGNOSIS — S79911A Unspecified injury of right hip, initial encounter: Secondary | ICD-10-CM | POA: Diagnosis not present

## 2019-02-25 DIAGNOSIS — F419 Anxiety disorder, unspecified: Secondary | ICD-10-CM | POA: Diagnosis not present

## 2019-02-25 DIAGNOSIS — R9431 Abnormal electrocardiogram [ECG] [EKG]: Secondary | ICD-10-CM | POA: Diagnosis not present

## 2019-02-25 NOTE — ED Triage Notes (Addendum)
Patient c/o fall and hitting her head; denies LOC. Pt currently takes Xarelto. Patient tripped on oxygen tubing. A&O x 4, NAD at this time. Currently living in transitional housing to learn to take are of herself (pt also received PT).

## 2019-02-26 ENCOUNTER — Other Ambulatory Visit: Payer: Self-pay | Admitting: *Deleted

## 2019-02-26 ENCOUNTER — Emergency Department (HOSPITAL_COMMUNITY): Payer: Medicare HMO

## 2019-02-26 DIAGNOSIS — R0602 Shortness of breath: Secondary | ICD-10-CM | POA: Diagnosis not present

## 2019-02-26 DIAGNOSIS — J441 Chronic obstructive pulmonary disease with (acute) exacerbation: Secondary | ICD-10-CM | POA: Diagnosis present

## 2019-02-26 DIAGNOSIS — M25511 Pain in right shoulder: Secondary | ICD-10-CM

## 2019-02-26 DIAGNOSIS — S79911A Unspecified injury of right hip, initial encounter: Secondary | ICD-10-CM | POA: Diagnosis not present

## 2019-02-26 DIAGNOSIS — M25551 Pain in right hip: Secondary | ICD-10-CM | POA: Diagnosis not present

## 2019-02-26 DIAGNOSIS — M25561 Pain in right knee: Secondary | ICD-10-CM | POA: Diagnosis not present

## 2019-02-26 DIAGNOSIS — W19XXXA Unspecified fall, initial encounter: Secondary | ICD-10-CM | POA: Diagnosis not present

## 2019-02-26 DIAGNOSIS — R062 Wheezing: Secondary | ICD-10-CM | POA: Diagnosis not present

## 2019-02-26 HISTORY — DX: Unspecified fall, initial encounter: W19.XXXA

## 2019-02-26 LAB — GLUCOSE, CAPILLARY
Glucose-Capillary: 185 mg/dL — ABNORMAL HIGH (ref 70–99)
Glucose-Capillary: 117 mg/dL — ABNORMAL HIGH (ref 70–99)

## 2019-02-26 LAB — CBC WITH DIFFERENTIAL/PLATELET
Abs Immature Granulocytes: 0.04 10*3/uL (ref 0.00–0.07)
Basophils Absolute: 0 10*3/uL (ref 0.0–0.1)
Basophils Relative: 0 %
Eosinophils Absolute: 0.2 10*3/uL (ref 0.0–0.5)
Eosinophils Relative: 2 %
HCT: 39.5 % (ref 36.0–46.0)
Hemoglobin: 11.6 g/dL — ABNORMAL LOW (ref 12.0–15.0)
Immature Granulocytes: 0 %
Lymphocytes Relative: 16 %
Lymphs Abs: 1.8 10*3/uL (ref 0.7–4.0)
MCH: 26.2 pg (ref 26.0–34.0)
MCHC: 29.4 g/dL — ABNORMAL LOW (ref 30.0–36.0)
MCV: 89.4 fL (ref 80.0–100.0)
Monocytes Absolute: 0.6 10*3/uL (ref 0.1–1.0)
Monocytes Relative: 5 %
Neutro Abs: 8.9 10*3/uL — ABNORMAL HIGH (ref 1.7–7.7)
Neutrophils Relative %: 77 %
Platelets: 518 10*3/uL — ABNORMAL HIGH (ref 150–400)
RBC: 4.42 MIL/uL (ref 3.87–5.11)
RDW: 19.3 % — ABNORMAL HIGH (ref 11.5–15.5)
WBC: 11.6 10*3/uL — ABNORMAL HIGH (ref 4.0–10.5)
nRBC: 0 % (ref 0.0–0.2)

## 2019-02-26 LAB — COMPREHENSIVE METABOLIC PANEL
ALT: 22 U/L (ref 0–44)
AST: 20 U/L (ref 15–41)
Albumin: 3.2 g/dL — ABNORMAL LOW (ref 3.5–5.0)
Alkaline Phosphatase: 91 U/L (ref 38–126)
Anion gap: 11 (ref 5–15)
BUN: 18 mg/dL (ref 8–23)
CO2: 25 mmol/L (ref 22–32)
Calcium: 8.9 mg/dL (ref 8.9–10.3)
Chloride: 105 mmol/L (ref 98–111)
Creatinine, Ser: 0.7 mg/dL (ref 0.44–1.00)
GFR calc Af Amer: >60 mL/min (ref >60)
GFR calc non Af Amer: >60 mL/min (ref >60)
Glucose, Bld: 118 mg/dL — ABNORMAL HIGH (ref 70–99)
Potassium: 3.8 mmol/L (ref 3.5–5.1)
Sodium: 141 mmol/L (ref 135–145)
Total Bilirubin: 0.5 mg/dL (ref 0.3–1.2)
Total Protein: 7.5 g/dL (ref 6.5–8.1)

## 2019-02-26 LAB — PROTIME-INR
INR: 1.6 — ABNORMAL HIGH (ref 0.8–1.2)
Prothrombin Time: 19.3 seconds — ABNORMAL HIGH (ref 11.4–15.2)

## 2019-02-26 LAB — BRAIN NATRIURETIC PEPTIDE: B Natriuretic Peptide: 34.5 pg/mL (ref 0.0–100.0)

## 2019-02-26 LAB — SARS CORONAVIRUS 2 BY RT PCR (HOSPITAL ORDER, PERFORMED IN ~~LOC~~ HOSPITAL LAB): SARS Coronavirus 2: NEGATIVE

## 2019-02-26 LAB — APTT: aPTT: 34 seconds (ref 24–36)

## 2019-02-26 MED ORDER — RIVAROXABAN 20 MG PO TABS
20.0000 mg | ORAL_TABLET | Freq: Every day | ORAL | Status: DC
  Administered 2019-02-26 – 2019-02-27 (×2): 20 mg via ORAL
  Filled 2019-02-26 (×3): qty 1

## 2019-02-26 MED ORDER — DILTIAZEM HCL ER COATED BEADS 120 MG PO CP24
120.0000 mg | ORAL_CAPSULE | Freq: Every day | ORAL | Status: DC
  Administered 2019-02-26 – 2019-02-27 (×2): 120 mg via ORAL
  Filled 2019-02-26 (×3): qty 1

## 2019-02-26 MED ORDER — LORATADINE 10 MG PO TABS
10.0000 mg | ORAL_TABLET | Freq: Every day | ORAL | Status: DC
  Administered 2019-02-26 – 2019-02-28 (×3): 10 mg via ORAL
  Filled 2019-02-26 (×3): qty 1

## 2019-02-26 MED ORDER — ALBUTEROL SULFATE HFA 108 (90 BASE) MCG/ACT IN AERS
8.0000 | INHALATION_SPRAY | Freq: Once | RESPIRATORY_TRACT | Status: AC
  Administered 2019-02-26: 8 via RESPIRATORY_TRACT

## 2019-02-26 MED ORDER — DOXYCYCLINE HYCLATE 100 MG PO TABS
100.0000 mg | ORAL_TABLET | Freq: Once | ORAL | Status: AC
  Administered 2019-02-26: 100 mg via ORAL
  Filled 2019-02-26: qty 1

## 2019-02-26 MED ORDER — SODIUM CHLORIDE 0.9 % IV BOLUS
1000.0000 mL | Freq: Once | INTRAVENOUS | Status: AC
  Administered 2019-02-26: 1000 mL via INTRAVENOUS

## 2019-02-26 MED ORDER — IPRATROPIUM-ALBUTEROL 0.5-2.5 (3) MG/3ML IN SOLN
3.0000 mL | Freq: Three times a day (TID) | RESPIRATORY_TRACT | Status: DC
  Filled 2019-02-26: qty 3

## 2019-02-26 MED ORDER — ALBUTEROL SULFATE HFA 108 (90 BASE) MCG/ACT IN AERS
4.0000 | INHALATION_SPRAY | Freq: Once | RESPIRATORY_TRACT | Status: AC
  Administered 2019-02-26: 4 via RESPIRATORY_TRACT

## 2019-02-26 MED ORDER — ACETAMINOPHEN 325 MG PO TABS
650.0000 mg | ORAL_TABLET | Freq: Four times a day (QID) | ORAL | Status: DC
  Administered 2019-02-26: 325 mg via ORAL
  Filled 2019-02-26: qty 2

## 2019-02-26 MED ORDER — IPRATROPIUM BROMIDE HFA 17 MCG/ACT IN AERS
2.0000 | INHALATION_SPRAY | Freq: Once | RESPIRATORY_TRACT | Status: AC
  Administered 2019-02-26: 2 via RESPIRATORY_TRACT
  Filled 2019-02-26: qty 12.9

## 2019-02-26 MED ORDER — VENLAFAXINE HCL ER 75 MG PO CP24
150.0000 mg | ORAL_CAPSULE | Freq: Every day | ORAL | Status: DC
  Administered 2019-02-27 – 2019-02-28 (×2): 150 mg via ORAL
  Filled 2019-02-26 (×2): qty 2

## 2019-02-26 MED ORDER — ACETAMINOPHEN 325 MG PO TABS
650.0000 mg | ORAL_TABLET | Freq: Four times a day (QID) | ORAL | Status: DC | PRN
  Administered 2019-02-26: 325 mg via ORAL
  Administered 2019-02-27 – 2019-02-28 (×2): 650 mg via ORAL
  Filled 2019-02-26 (×2): qty 2

## 2019-02-26 MED ORDER — HYDROXYZINE PAMOATE 25 MG PO CAPS
25.0000 mg | ORAL_CAPSULE | Freq: Two times a day (BID) | ORAL | Status: DC | PRN
  Filled 2019-02-26 (×2): qty 1

## 2019-02-26 MED ORDER — ALBUTEROL (5 MG/ML) CONTINUOUS INHALATION SOLN
10.0000 mg/h | INHALATION_SOLUTION | Freq: Once | RESPIRATORY_TRACT | Status: AC
  Administered 2019-02-26: 10 mg/h via RESPIRATORY_TRACT
  Filled 2019-02-26: qty 20

## 2019-02-26 MED ORDER — FLECAINIDE ACETATE 100 MG PO TABS
100.0000 mg | ORAL_TABLET | Freq: Two times a day (BID) | ORAL | Status: DC
  Administered 2019-02-26 – 2019-02-28 (×4): 100 mg via ORAL
  Filled 2019-02-26 (×7): qty 1

## 2019-02-26 MED ORDER — PREDNISONE 20 MG PO TABS
40.0000 mg | ORAL_TABLET | Freq: Every day | ORAL | Status: DC
  Administered 2019-02-27 – 2019-02-28 (×2): 40 mg via ORAL
  Filled 2019-02-26 (×2): qty 2

## 2019-02-26 MED ORDER — UMECLIDINIUM-VILANTEROL 62.5-25 MCG/INH IN AEPB
1.0000 | INHALATION_SPRAY | Freq: Every day | RESPIRATORY_TRACT | Status: DC
  Filled 2019-02-26: qty 14

## 2019-02-26 MED ORDER — DOXYCYCLINE HYCLATE 100 MG PO TABS
100.0000 mg | ORAL_TABLET | Freq: Two times a day (BID) | ORAL | Status: DC
  Administered 2019-02-26 – 2019-02-28 (×4): 100 mg via ORAL
  Filled 2019-02-26 (×4): qty 1

## 2019-02-26 MED ORDER — PREDNISONE 20 MG PO TABS
60.0000 mg | ORAL_TABLET | Freq: Once | ORAL | Status: AC
  Administered 2019-02-26: 60 mg via ORAL
  Filled 2019-02-26: qty 3

## 2019-02-26 MED ORDER — IPRATROPIUM-ALBUTEROL 0.5-2.5 (3) MG/3ML IN SOLN
3.0000 mL | Freq: Four times a day (QID) | RESPIRATORY_TRACT | Status: DC
  Administered 2019-02-26: 3 mL via RESPIRATORY_TRACT
  Filled 2019-02-26: qty 3

## 2019-02-26 MED ORDER — ALBUTEROL SULFATE HFA 108 (90 BASE) MCG/ACT IN AERS
8.0000 | INHALATION_SPRAY | Freq: Once | RESPIRATORY_TRACT | Status: AC
  Administered 2019-02-26: 8 via RESPIRATORY_TRACT
  Filled 2019-02-26: qty 6.7

## 2019-02-26 MED ORDER — ALBUTEROL SULFATE (2.5 MG/3ML) 0.083% IN NEBU: 2.5000 mg | INHALATION_SOLUTION | RESPIRATORY_TRACT | Status: DC | PRN

## 2019-02-26 MED ORDER — FAMOTIDINE 20 MG PO TABS
20.0000 mg | ORAL_TABLET | Freq: Every day | ORAL | Status: DC
  Administered 2019-02-27 – 2019-02-28 (×2): 20 mg via ORAL
  Filled 2019-02-26 (×2): qty 1

## 2019-02-26 MED ORDER — SODIUM CHLORIDE 0.9 % IV SOLN
1.0000 g | INTRAVENOUS | Status: DC
  Administered 2019-02-27: 1 g via INTRAVENOUS
  Filled 2019-02-26: qty 10

## 2019-02-26 MED ORDER — METOPROLOL TARTRATE 25 MG PO TABS
25.0000 mg | ORAL_TABLET | Freq: Two times a day (BID) | ORAL | Status: DC
  Administered 2019-02-26 – 2019-02-28 (×5): 25 mg via ORAL
  Filled 2019-02-26 (×5): qty 1

## 2019-02-26 MED ORDER — SODIUM CHLORIDE 0.9 % IV SOLN
1.0000 g | Freq: Once | INTRAVENOUS | Status: AC
  Administered 2019-02-26: 1 g via INTRAVENOUS
  Filled 2019-02-26: qty 10

## 2019-02-26 MED ORDER — CYCLOSPORINE 0.05 % OP EMUL
1.0000 [drp] | Freq: Two times a day (BID) | OPHTHALMIC | Status: DC
  Administered 2019-02-26 – 2019-02-28 (×4): 1 [drp] via OPHTHALMIC
  Filled 2019-02-26 (×7): qty 30

## 2019-02-26 MED ORDER — ACETAMINOPHEN 325 MG PO TABS
325.0000 mg | ORAL_TABLET | Freq: Once | ORAL | Status: AC
  Administered 2019-02-26: 325 mg via ORAL
  Filled 2019-02-26: qty 1

## 2019-02-26 NOTE — ED Provider Notes (Signed)
[name] White is a 63 y.o. female, with a history of chronic lower back pain, COPD, HTN, A. fib, PE, presenting to the ED for evaluation following a fall.  Patient tells me she has been feeling intermittently short of breath with chest tightness over the past 24 hours.  This shortness of breath caused her to start wearing her supplemental oxygen.  She usually wears it nightly and then as needed during the day.  She states her current shortness of breath feels more like COPD exacerbation and does not feel like a PE.  She sustained what sounds like a mechanical fall last night by losing her balance due to tripping on her oxygen tubing, landing on her right side. During my interview with the patient, she complains of increased shortness of breath and cough, intermittently begins to improve with albuterol inhaler.  She also complains of pain in the right shoulder, right hip, and right knee.  These pains are mild to moderate, described as soreness, nonradiating from these locations. She has pain and tenderness to the central forehead at the hairline.  HPI from [name] Lange, PA-C: "Patient to ED for evaluation of fall. She got up from bed last night and tripped on her oxygen tubing, falling face first onto the floor. No LOC. She hit her head and injured her right knee. She is anticoagulated with Xarelto for history of PE and complains of frontal headache. No nausea or vomiting. She denies chest, neck or abdominal pain.   The history is provided by the patient. No language interpreter was used.  Fall Associated symptoms include headaches."   Past Medical History:  Diagnosis Date  . Anxiety   . Asthma   . Chronic lower back pain   . COPD (chronic obstructive pulmonary disease) ([place])   . Depression   . Family history of anesthesia complication    "daughter; causes her to pass out afterwards"  . GERD (gastroesophageal reflux disease)   . Hypertension   . Hyperthyroidism   . Migraine     "monthly" (12/28/2013)  . Osteoarthritis    "both knees; back of my neck; right pelvic bone" (12/28/2013)  . Paroxysmal A-fib ([place])   . Pulmonary embolism ([place]) 12/28/2013   "2 clots in each lung"   Physical Exam  BP 126/80   Pulse 78   Temp 98 F (36.7 C) (Oral)   Resp 18   Ht 5\' 5"  (1.651 m)   Wt (!) 146.1 kg   SpO2 100%   BMI 53.58 kg/m   Physical Exam Vitals signs and nursing note reviewed.  Constitutional:      General: She is not in acute distress.    Appearance: She is well-developed. She is obese. She is not diaphoretic.  HENT:     Head: Normocephalic and atraumatic.     Mouth/Throat:     Mouth: Mucous membranes are moist.     Pharynx: Oropharynx is clear.  Eyes:     Conjunctiva/sclera: Conjunctivae normal.  Neck:     Musculoskeletal: Neck supple.  Cardiovascular:     Rate and Rhythm: Normal rate and regular rhythm.     Pulses: Normal pulses.          Radial pulses are 2+ on the right side and 2+ on the left side.       Posterior tibial pulses are 2+ on the right side and 2+ on the left side.     Heart sounds: Normal heart sounds.     Comments: Tactile temperature  in the extremities appropriate and equal bilaterally. Pulmonary:     Breath sounds: Examination of the right-upper field reveals wheezing. Examination of the left-upper field reveals wheezing. Examination of the left-lower field reveals wheezing. Wheezing present.     Comments: Some increased work of breathing, especially with speaking. Wheezing audible without stethoscope.  Abdominal:     Palpations: Abdomen is soft.     Tenderness: There is no abdominal tenderness. There is no guarding.  Musculoskeletal:        General: Tenderness present.     Right shoulder: She exhibits tenderness.     Right hip: She exhibits tenderness.     Right lower leg: No edema.     Left lower leg: No edema.     Comments: Tenderness to right superior and anterior shoulder without deformity, instability, swelling, or color  change. Full ROM, though painful.  No tenderness over clavicle.  Tenderness to right lateral hip into right upper thigh without noted swelling, deformity, instability, or color change.   Normal motor function intact in all other extremities. No midline spinal tenderness.     Lymphadenopathy:     Cervical: No cervical adenopathy.  Skin:    General: Skin is warm and dry.  Neurological:     Mental Status: She is alert.     Comments: Sensation grossly intact to light touch in the extremities.  Grip strengths equal bilaterally.  Strength 5/5 in all extremities. Coordination intact. Cranial nerves III-XII grossly intact. No facial droop.   Sensation grossly intact to light touch through each of the nerve distributions of the bilateral upper extremities. Abduction and adduction of the fingers intact against resistance. Grip strength equal bilaterally. Supination and pronation intact against resistance. Strength 5/5 through the cardinal directions of the bilateral wrists. Strength 5/5 with flexion and extension of the bilateral elbows. Patient can touch the thumb to each one of the fingertips without difficulty.   Psychiatric:        Mood and Affect: Mood and affect normal.        Speech: Speech normal.        Behavior: Behavior normal.     ED Course/Procedures    Procedures   Abnormal Labs Reviewed  PROTIME-INR - Abnormal; Notable for the following components:      Result Value   Prothrombin Time 19.3 (*)    INR 1.6 (*)    All other components within normal limits  CBC WITH DIFFERENTIAL/PLATELET - Abnormal; Notable for the following components:   WBC 11.6 (*)    Hemoglobin 11.6 (*)    MCHC 29.4 (*)    RDW 19.3 (*)    Platelets 518 (*)    Neutro Abs 8.9 (*)    All other components within normal limits  COMPREHENSIVE METABOLIC PANEL - Abnormal; Notable for the following components:   Glucose, Bld 118 (*)    Albumin 3.2 (*)    All other components within normal limits     Dg Shoulder Right  Result Date: 02/26/2019 CLINICAL DATA:  Right shoulder pain after fall. EXAM: RIGHT SHOULDER - 2+ VIEW COMPARISON:  Radiographs of October 24, 2010. FINDINGS: There is no evidence of fracture or dislocation. Mild degenerative changes seen involving the right acromioclavicular and glenohumeral joints. Soft tissues are unremarkable. IMPRESSION: Mild degenerative joint disease involving the right acromioclavicular and glenohumeral joints. No acute abnormality seen in the right shoulder. Electronically Signed   By: Marijo Conception M.D.   On: 02/26/2019 09:40   Ct Head  Wo Contrast  Result Date: 02/26/2019 CLINICAL DATA:  Head trauma, minor, fall with head strike, on anticoagulation. EXAM: CT HEAD WITHOUT CONTRAST TECHNIQUE: Contiguous axial images were obtained from the base of the skull through the vertex without intravenous contrast. COMPARISON:  CT head 12/31/2007 FINDINGS: Brain: No evidence of acute infarction, hemorrhage, hydrocephalus, extra-axial collection or mass lesion/mass effect. Vascular: No hyperdense vessel or unexpected calcification. Skull: No calvarial fracture or suspicious osseous lesion. No scalp swelling or hematoma. Sinuses/Orbits: Paranasal sinuses and mastoid air cells are predominantly clear. Pneumatization of the petrous apices. Included orbital structures are unremarkable. Debris within the right external auditory canal. Middle ear cavities are clear. Other: None. IMPRESSION: No acute intracranial abnormality. Electronically Signed   By: Lovena Le M.D.   On: 02/26/2019 00:25   Dg Chest Portable 1 View  Result Date: 02/26/2019 CLINICAL DATA:  Wheezing. Additional history: Chest tightness morning, fall at home last night. EXAM: PORTABLE CHEST 1 VIEW COMPARISON:  CT chest 12/26/2018, chest radiographs 12/26/2018 FINDINGS: Stable cardiomediastinal silhouette. Aortic atherosclerosis. Shallow inspiration radiograph. Mild left retrocardiac opacity may reflect  atelectasis. No evidence of pleural effusion or pneumothorax. No acute bony abnormality. IMPRESSION: Shallow inspiration radiograph. Mild left retrocardiac opacity may reflect atelectasis. Pneumonia is difficult to exclude. Electronically Signed   By: Kellie Simmering   On: 02/26/2019 08:07   Dg Knee Complete 4 Views Right  Result Date: 02/26/2019 CLINICAL DATA:  Fall, pain, injury. Suprapatellar swelling. EXAM: RIGHT KNEE - COMPLETE 4+ VIEW COMPARISON:  None. FINDINGS: Mild prepatellar soft tissue swelling. No joint effusion. No acute fracture or traumatic malalignment. Moderate tricompartmental degenerative changes are present most pronounced in the patellofemoral and medial femorotibial compartments. Enthesopathic changes are noted at the patellar insertion of the distal quadriceps tendon. Additional spurring noted along the medial femoral condyle. Remaining soft tissues are unremarkable. IMPRESSION: Mild prepatellar swelling. No effusion or acute osseous abnormality. Moderate tricompartmental degenerative changes most pronounced in the medial and patellofemoral compartments. Electronically Signed   By: Lovena Le M.D.   On: 02/26/2019 00:28   Dg Hip Unilat W Or Wo Pelvis 2-3 Views Right  Result Date: 02/26/2019 CLINICAL DATA:  Right hip pain after fall. EXAM: DG HIP (WITH OR WITHOUT PELVIS) 2-3V RIGHT COMPARISON:  None. FINDINGS: There is no evidence of hip fracture or dislocation. There is no evidence of arthropathy or other focal bone abnormality. IMPRESSION: Negative. Electronically Signed   By: Marijo Conception M.D.   On: 02/26/2019 09:41     EKG Interpretation  Date/Time:  Tuesday February 26 2019 10:03:41 EDT Ventricular Rate:  85 PR Interval:    QRS Duration: 91 QT Interval:  365 QTC Calculation: 434 R Axis:   68 Text Interpretation:  Sinus rhythm Borderline prolonged PR interval Borderline T wave abnormalities No significant change since last tracing Confirmed by Gareth Morgan [phone])  on 02/26/2019 10:26:24 AM       MDM   Clinical Course as of Feb 26 1324  Tue Feb 26, 2019  1251 Spoke with Dr. Tarry Kos, Family Med Resident.  Agrees to come see the patient for admission.   [SJ]    Clinical Course User Index [SJ] Lorayne Bender, PA-C   Patient care handoff report received from [name] White, [place]. Plan: Reassess breathing and pain.  Persistent shortness of breath and wheezing.  Tachypnea and increased work of breathing.  Room air SPO2 down to 89% at rest.  Improved following albuterol and Atrovent inhaler treatments, but then shortness of breath, wheezing,  and tachypnea returns. My suspicion for other diagnoses, such as PE, are low.  Patient was not tachycardic until after the albuterol treatments, she is currently anticoagulated with Xarelto, she has wheezing on exam, and she states her symptoms are consistent with previous COPD exacerbations. Question of pneumonia on chest x-ray.  Therefore, I ordered the patient antibiotics.  Mild leukocytosis.  Patient is afebrile and nontoxic-appearing.  Patient admitted due to shortness of breath with hypoxia requiring consistent supplemental O2.  Findings and plan of care discussed with Gareth Morgan, MD.   Vitals:   02/25/19 2328 02/26/19 0202 02/26/19 0403 02/26/19 0605  BP: 122/88 (!) 108/59 103/62 126/80  Pulse: 94 77 83 78  Resp: (!) 22 19 18    Temp: 98 F (36.7 C)     TempSrc: Oral     SpO2: 99% 100% 97% 100%  Weight: (!) 146.1 kg     Height: 5\' 5"  (1.651 m)      Vitals:   02/26/19 1215 02/26/19 1233 02/26/19 1236 02/26/19 1245  BP:  (!) 151/120  118/78  Pulse: (!) 107 (!) 104  (!) 103  Resp: (!) 21   (!) 22  Temp:      TempSrc:      SpO2: 94%  100% 100%  Weight:      Height:          Lorayne Bender, PA-C 02/26/19 1330    Gareth Morgan, MD 02/27/19 2051

## 2019-02-26 NOTE — ED Notes (Signed)
Patient transported to X-ray 

## 2019-02-26 NOTE — Evaluation (Signed)
Physical Therapy Evaluation Patient Details Name: [name] White MRN: [id number] DOB: Jun 24, 1956 Today's Date: 02/26/2019   History of Present Illness  Pt is a 63 y/o female admitted following fall and increased SOB likely from COPD exacerbation. PMH includes asthma, COPD, HTN, a fib, and PE.   Clinical Impression  Pt admitted secondary to problem above with deficits below. Pt presenting with limited ROM in RLE secondary to R knee pain. Mobility limited this session secondary to increased wheezing and SOB. After rolling, pt extremely wheezy and requiring extended time to recover. Oxygen sats at 94-95% on 2L. Further mobility deferred. Reviewed RLE exercise program with pt. Will continue to follow acutely to maximize functional mobility independence and safety.     Follow Up Recommendations SNF;Supervision for mobility/OOB    Equipment Recommendations  Other (comment)(TBD)    Recommendations for Other Services       Precautions / Restrictions Precautions Precautions: Fall Precaution Comments: Pt had fall after tripping over oxygen tubing.  Restrictions Weight Bearing Restrictions: No      Mobility  Bed Mobility Overal bed mobility: Needs Assistance Bed Mobility: Rolling Rolling: Supervision         General bed mobility comments: Pt relying heavily on bed rail to roll. Once returned to supine pt extremely wheezy and required extended time to calm breathing. Oxygen sats at 94-95% on RA. Further mobility deferred.   Transfers                    Ambulation/Gait                Stairs            Wheelchair Mobility    Modified Rankin (Stroke Patients Only)       Balance                                             Pertinent Vitals/Pain Pain Assessment: Faces Faces Pain Scale: Hurts even more Pain Location: L knee and thigh  Pain Descriptors / Indicators: Grimacing;Guarding Pain Intervention(s): Limited activity within  patient's tolerance;Monitored during session;Repositioned    Home Living Family/patient expects to be discharged to:: Other (Comment)(Motel/transitional housing) Living Arrangements: Alone Available Help at Discharge: Friend(s);Available PRN/intermittently Type of Home: Other(Comment)(motel ) Home Access: Level entry     Home Layout: One level Home Equipment: Cane - single point;Walker - 2 wheels      Prior Function Level of Independence: Independent with assistive device(s)         Comments: Ambulated with cane, however, was limited to household distances.      Hand Dominance        Extremity/Trunk Assessment   Upper Extremity Assessment Upper Extremity Assessment: Defer to OT evaluation    Lower Extremity Assessment Lower Extremity Assessment: RLE deficits/detail RLE Deficits / Details: Limited ROM in knee secondary to pain. Was able to perform partial heel slide and ankle pump.        Communication   Communication: No difficulties  Cognition Arousal/Alertness: Awake/alert Behavior During Therapy: WFL for tasks assessed/performed Overall Cognitive Status: Within Functional Limits for tasks assessed                                        General Comments  Exercises General Exercises - Lower Extremity Ankle Circles/Pumps: AROM;Right;5 reps Heel Slides: AROM;Right;10 reps(partial ROM )   Assessment/Plan    PT Assessment Patient needs continued PT services  PT Problem List Decreased strength;Decreased balance;Decreased activity tolerance;Decreased mobility;Cardiopulmonary status limiting activity;Decreased knowledge of use of DME;Pain       PT Treatment Interventions DME instruction;Gait training;Functional mobility training;Therapeutic activities;Therapeutic exercise;Balance training;Patient/family education    PT Goals (Current goals can be found in the Care Plan section)  Acute Rehab PT Goals Patient Stated Goal: to be able to  walk and breathe better PT Goal Formulation: With patient Time For Goal Achievement: 03/12/19 Potential to Achieve Goals: Good    Frequency Min 3X/week   Barriers to discharge Decreased caregiver support      Co-evaluation               AM-PAC PT "6 Clicks" Mobility  Outcome Measure Help needed turning from your back to your side while in a flat bed without using bedrails?: A Little Help needed moving from lying on your back to sitting on the side of a flat bed without using bedrails?: A Little Help needed moving to and from a bed to a chair (including a wheelchair)?: A Lot Help needed standing up from a chair using your arms (e.g., wheelchair or bedside chair)?: A Lot Help needed to walk in hospital room?: A Lot Help needed climbing 3-5 steps with a railing? : A Lot 6 Click Score: 14    End of Session   Activity Tolerance: Treatment limited secondary to medical complications (Comment)(increased wheezing/SOB ) Patient left: in bed;with call bell/phone within reach Nurse Communication: Mobility status;Other (comment)(pt wheezing/SOB ) PT Visit Diagnosis: Muscle weakness (generalized) (M62.81);Difficulty in walking, not elsewhere classified (R26.2);History of falling (Z91.81);Repeated falls (R29.6);Pain Pain - Right/Left: Right Pain - part of body: Knee;Leg    Time: [id number] PT Time Calculation (min) (ACUTE ONLY): 20 min   Charges:   PT Evaluation $PT Eval Moderate Complexity: 1 Mod          Leighton Ruff, PT, DPT  Acute Rehabilitation Services  Pager: [phone] Office: [phone]   Rudean Hitt 02/26/2019, 6:27 PM

## 2019-02-26 NOTE — H&P (Addendum)
[facility] Admission History and Physical Service Pager: [phone]  Patient name: [name] White Medical record number: [id number] Date of birth: [birth year] Age: 63 y.o. Gender: female  Primary Care Provider: [name], MD Consultants: None Code Status: Full Preferred Emergency Contact: Rowan Blase [phone]  Chief Complaint: SOB  Assessment and Plan: MACKENZEY SWANGO is a 63 y.o. female presenting with worsening SOB. PMH is significant for HTN, GERD, chronic atrial fibrillation, history of PE (2015), COPD, chronic respiratory failure on 2L O2,  asthma, MDD.  Shortness of Breath  H\o COPD  Patient with new wet nonproductive cough and chest tightness in the setting of COPD and increased oxygen requirement (baseline 2L O2 only at night) and difficulty taking a deep breath. In the ED she was found to have desats on room air which is abnormal for her. Chest x-ray reveals a "mild left retrocardiac opacity may reflect atelectasis, pneumonia is difficult to exclude."  In the ED, she received Ventolin nebulizer, Rocephin 1g x1, and doxycycline 100 mg x1 as she is allergic to azithromycin for COPD exacerbation and possible CAP. O2 saturation 96% on 2L [place], RR 22, tachycardic to 105, BP 132/69. Most likely etiology is an exacerbation of her COPD. Possible PNA, however she is afebrile with only mild leukocytosis of 11.6 and tachycardia is likely due to the albuterol.  These symptoms are less likely to be due to CHF as patient does not have a h/o CHF, her BNP was WNL to 34.5, and her volume status appears to be at baseline with nonpitting edema to mid shins. These symptoms are also not likely due to an arrhythmia as her EKG reveals a normal rhythm; however, due to borderline prolonged PR interval we will repeat EKG in the morning.  This patient does have a history of PE however she is anticoagulated appropriately, she has no vital signs indicative of a PE and only  very mild tachycardia to 103-105 explained by many uses of albuterol today, and patient states that this does not feel like her previous experience with PE.  Patient has a Wells score of 3, indicating moderate likelihood. Per chart review, does appear she presented with similar symptoms at time of presentation for pulmonary emboli in 2015. Risk factors for her PE at that time included exposure to estrogen replacement and increased sedentary lifestyle 2/2 to achilles tendon injury. However, due to her previously mentioned anticoagulated status on Xarelto and lack of clinical findings, PE is lower on our differential diagnosis at this time.  We are considering obtaining a d-dimer vs V/Q scan, however since she is already anticoagulated we will hold off at this time and discuss.  Her home meds include an albuterol nebulizer and a Ventolin inhaler as needed. - admit to [facility], med-surg telemetry, attending Dr. Nori Riis - continue albuterol nebulizer q2h PRN - start duoneb q6h - continue rocephin 1g IV qd - continue doxycycline 100 mg PO q12h - will consider discontinuation of antibiotics pending improvement overnight - start prednisone 40 mg PO qd x 5 days (9/1-9/5) - continue home rivaroxaban 20 mg PO  - Will consider obtaining a d-dimer - repeat ekg tomorrow AM - follow up AM labs - continuous pulse ox - wean oxygen as tolerated - ambulate with pulse ox   Pain due to fall Patient fell last night due to tripping over her oxygen tubing to use the bathroom.  She did not lose consciousness, no lacerations, able to move all limbs, sensation intact.  She  does have pain in her head where she fell but only when palpated.  No evidence of swelling.  All imaging returned within normal limits (see below in imaging section).  No evidence of bleeding.  Patient given Tylenol for pain. - tylenol prn - k-pad  Atrial Fibrillation: Pt was diagnosed in 2015 y. She is rate controlled and on long term anticoagulation. EKG  reveals sinus tachycardia likely secondary to albuterol. Home meds include: Diltiazem 120mg  QD, Lopressor 25mg  BID, Flecainide 200mg  BID, and Xarelto 20mg  qHS.  - continue home meds - AM EKG  History of Pulmonary Embolism: History of PE in 2015.expected to be provoked to previous estrogen replacement and increased sedentary lifestyle after an injur Please see shortness of breath problem above for elucidation of PE differential. Patient is on xarelto at home. - will continue rivaroxaban 20 mg PO  MDD/Anxiety: Patient has long history of MDD/Anxiety, both are stable. She does not currently have any SI, HI, low mood, tearfulness, or trouble sleeping.  Will continue her home medications which are effexor and hydroxyzine. - continue Effexor 150mg  QD  - continue Hydroxyzine 25mg  BID PRN  GERD: Patient has a longstanding history of GERD, but it is not active at this time we will continue her home med of Pepcid.   - continue home meds   Osteoarthritis Patient has a history of osteoarthritis.  She is currently seeing PT to improve her deconditioning and knee pain.  She uses Voltaren gel and Tylenol for pain  - tylenol prn  Dry Eyes Patient uses Restasis at home. - continue home med  FEN/GI: Heart healthy diet Prophylaxis: Xarelto 20 mg  Disposition: Med surge.  To home pending respiratory improvement.  History of Present Illness:  [name] White is a 63 y.o. female presenting with worsening SOB. She notes she uses oxygen nightly, however developed more chest tightness last night while watching TV and continued to wear her oxygen.  She notes the chest tightness feels like she cant take a deep breath. She denies any chest pain. She notes that the oxygen did not really help.  Patient had gone to sleep and woke up to use the bathroom and unfortunately tripped over her oxygen tubing and fell last night. She did not lose consciousness. She notes after she fell she felt dizzy thus she continued to  lay down until she felt better. After her fall she called the ambulance and was brought to the ED.   In the ED, patient had CT head, x-ray of right knee, shoulder, and pelvis that were unremarkable without signs of fracture or bleed. She does endorse continued soreness of these areas.   While in the ED, she developed a wet cough, but not yet productive. Denies any fever, chills, nausea, vomiting, sick contacts. She lives in transitional housing at [facility] [address]. She ambulates with a cane or walker. She can walk ~5 feet before feeling short of breath at baseline. She is currently receiving physical therapy to help with deconditioning. She notes she sleep at 45 degree angle chronically, not any worse.  Denies current tobacco, alcohol, or illicit drugs. History of tobacco use x 20 years, 1 pack every 3-4 days, quit 2000.    Review Of Systems: Per HPI with the following additions:   Review of Systems  Constitutional: Negative for chills, diaphoresis and fever.  HENT: Negative for congestion, ear pain, sinus pain and sore throat.   Eyes: Positive for blurred vision (after fall, resolved).  Respiratory: Positive for cough (  wet), shortness of breath and wheezing. Negative for hemoptysis and sputum production.   Cardiovascular: Negative for chest pain.  Gastrointestinal: Negative for abdominal pain, blood in stool, constipation, diarrhea, melena, nausea and vomiting.  Genitourinary: Negative for dysuria and hematuria.  Musculoskeletal: Positive for falls (tripped over oxygen tubes) and myalgias (from fall).  Skin: Negative for rash.  Neurological: Positive for headaches (to touch). Negative for seizures and loss of consciousness.  Psychiatric/Behavioral: Negative for depression and suicidal ideas.    Patient Active Problem List   Diagnosis Date Noted  . COPD exacerbation ([place]) 02/26/2019  . Urge incontinence 01/27/2019  . Homelessness 12/30/2018  . Encounter for rehabilitation 12/04/2018  .  Exposure to Covid-19 Virus 11/02/2018  . Reactive airway disease 09/10/2018  . Shortness of breath 09/09/2018  . Acute bronchitis 08/29/2018  . Acute on chronic respiratory failure with hypoxia ([place]) 08/29/2018  . Sleep apnea 07/05/2018  . Chronic respiratory failure with hypoxia ([place]) 07/05/2018  . Osteoarthritis 03/02/2018  . Viral illness 02/13/2018  . Nausea 02/13/2018  . Left medial tibial stress syndrome 12/26/2017  . Hematoma 11/27/2017  . Severe episode of recurrent major depressive disorder, without psychotic features ([place])   . MDD (major depressive disorder), recurrent episode, severe ([place]) 09/07/2015  . Atrial flutter ([place]) 07/29/2015  . History of atrial flutter 06/26/2015  . Chest pain 03/22/2015  . Asthma 11/12/2014  . History of hypertension 11/05/2014  . Atrial fibrillation, chronic 11/03/2014  . Chronic anticoagulation 11/03/2014  . History of pulmonary embolus (PE) 11/03/2014  . Hypokalemia 11/03/2014  . Non-traumatic rhabdomyolysis 11/03/2014  . Major depressive disorder 11/03/2014  . SOB (shortness of breath)   . MDD (major depressive disorder), recurrent severe, without psychosis ([place]) 03/05/2014  . Pulmonary embolism ([place]) 12/27/2013  . Vocal cord dysfunction 11/12/2013  . Mixed headache 01/06/2012  . Hot flashes 04/22/2011  . OVERACTIVE BLADDER 04/18/2008  . Osteoarthritis of both knees 04/18/2008  . POLYARTHRITIS 09/14/2007  . Morbid obesity ([place]) 08/24/2006  . RESTLESS LEGS SYNDROME 08/24/2006  . HYPERTENSION, BENIGN SYSTEMIC 08/24/2006  . RHINITIS, ALLERGIC 08/24/2006  . GASTROESOPHAGEAL REFLUX, NO ESOPHAGITIS 08/24/2006    Past Medical History: Past Medical History:  Diagnosis Date  . Anxiety   . Asthma   . Chronic lower back pain   . COPD (chronic obstructive pulmonary disease) ([place])   . Depression   . Family history of anesthesia complication    "daughter; causes her to pass out afterwards"  . GERD (gastroesophageal reflux disease)   .  Hypertension   . Hyperthyroidism   . Migraine    "monthly" (12/28/2013)  . Osteoarthritis    "both knees; back of my neck; right pelvic bone" (12/28/2013)  . Paroxysmal A-fib ([place])   . Pulmonary embolism ([place]) 12/28/2013   "2 clots in each lung"    Past Surgical History: Past Surgical History:  Procedure Laterality Date  . ABDOMINAL HYSTERECTOMY    . APPENDECTOMY    . BREAST CYST EXCISION Right   . DILATION AND CURETTAGE OF UTERUS    . ELECTROPHYSIOLOGIC STUDY N/A 05/27/2015   Procedure: A-Flutter;  Surgeon: Evans Lance, MD;  Location: [facility];  Service: Cardiovascular;  Laterality: N/A;  . EXCISIONAL HEMORRHOIDECTOMY    . NASAL SINUS SURGERY  2007  . THYROIDECTOMY, PARTIAL Right 2005  . TUBAL LIGATION    . WISDOM TOOTH EXTRACTION      Social History: Social History   Tobacco Use  . Smoking status: Former Smoker    Packs/day:  0.25    Years: 20.00    Pack years: 5.00    Types: Cigarettes    Quit date: 07/17/1999    Years since quitting: 19.6  . Smokeless tobacco: Never Used  Substance Use Topics  . Alcohol use: Not Currently    Comment: "drank some in my 30's"  . Drug use: No   Additional social history: Denies current tobacco, alcohol, or illicit drugs. History of tobacco use x 20 years, 1 pack every 3-4 days, quit 2000.  Please also refer to relevant sections of EMR.  Family History: Family History  Problem Relation Age of Onset  . Osteoarthritis Mother   . Asthma Mother   . Heart failure Mother   . Breast cancer Daughter     Allergies and Medications: Allergies  Allergen Reactions  . Caffeine Other (See Comments)    Migraine  . Hydralazine Hcl     Other reaction(s): Other (See Comments) AKI leading to rhabdo and electrolyte abnormalities  . Hydrocodone Nausea And Vomiting    Headache  . Ciprofloxacin Hives and Rash  . Erythromycin Hives and Rash  . Lisinopril Cough  . Oxycodone Nausea And Vomiting    Headache  .  Sulfamethoxazole-Trimethoprim Rash   No current facility-administered medications on file prior to encounter.    Current Outpatient Medications on File Prior to Encounter  Medication Sig Dispense Refill  . acetaminophen (TYLENOL) 325 MG tablet Take 2 tablets (650 mg total) by mouth every 6 (six) hours as needed for mild pain.    Marland Kitchen albuterol (PROVENTIL) (2.5 MG/3ML) 0.083% nebulizer solution Take 3 mLs (2.5 mg total) by nebulization every 6 (six) hours as needed for wheezing or shortness of breath. 75 mL 4  . azelastine (OPTIVAR) 0.05 % ophthalmic solution Place 1 drop into both eyes 2 (two) times daily as needed (itchy eyes).     . cyclobenzaprine (FLEXERIL) 10 MG tablet TAKE 1 TABLET BY MOUTH EVERY 8 HOURS AS NEEDED FOR PAIN (Patient taking differently: Take 10 mg by mouth every 8 (eight) hours as needed (pain). ) 60 tablet 0  . cycloSPORINE (RESTASIS) 0.05 % ophthalmic emulsion Place 1 drop into both eyes 2 (two) times daily.    . diclofenac sodium (VOLTAREN) 1 % GEL APPLY 2 GRAMS EXTERNALLY TO THE AFFECTED AREA FOUR TIMES DAILY (Patient taking differently: Apply 2 g topically 4 (four) times daily as needed (pain). ) 100 g 0  . diltiazem (CARDIZEM CD) 120 MG 24 hr capsule Take 1 capsule (120 mg total) by mouth daily. Please keep upcoming appt in January before anymore refills. Final Attempt (Patient taking differently: Take 120 mg by mouth at bedtime. Please keep upcoming appt in January before anymore refills. Final Attempt) 90 capsule 3  . famotidine (PEPCID) 20 MG tablet Take 1 tablet (20 mg total) by mouth daily. 90 tablet 0  . flecainide (TAMBOCOR) 100 MG tablet TAKE 1 TABLET BY MOUTH 2 TIMES A DAY. PLEASE KEEP UPCOMING APPOINTMENT IN JANUARY BEFORE ANYMORE REFILLS. (Patient taking differently: Take 100 mg by mouth 2 (two) times daily. ) 60 tablet 6  . fluticasone (FLONASE) 50 MCG/ACT nasal spray Place 2 sprays into both nostrils every morning.    . hydrocortisone (ANUSOL-HC) 25 MG suppository  Place 1 suppository (25 mg total) rectally 2 (two) times daily. (Patient taking differently: Place 25 mg rectally 2 (two) times daily as needed for hemorrhoids or anal itching. ) 12 suppository 0  . hydrOXYzine (VISTARIL) 25 MG capsule Take 25 mg by mouth 2 (  two) times daily as needed for anxiety.    Marland Kitchen loratadine (CLARITIN) 10 MG tablet Take 1 tablet (10 mg total) by mouth daily. 90 tablet 0  . metoprolol tartrate (LOPRESSOR) 25 MG tablet TAKE 1 TABLET BY MOUTH TWICE DAILY (Patient taking differently: Take 25 mg by mouth 2 (two) times daily. ) 120 tablet 0  . OXYGEN 2L at bedtime and prn exertion    . venlafaxine XR (EFFEXOR-XR) 150 MG 24 hr capsule Take 150 mg by mouth daily with breakfast.    . VENTOLIN HFA 108 (90 Base) MCG/ACT inhaler INHALE 2 PUFFS INTO THE LUNGS EVERY 6 HOURS AS NEEDED FOR WHEEZING OR SHORTNESS OF BREATH (Patient taking differently: Inhale 2 puffs into the lungs every 6 (six) hours as needed for wheezing or shortness of breath. ) 18 g 0  . XARELTO 20 MG TABS tablet TAKE 1 TABLET(20 MG) BY MOUTH DAILY AFTER SUPPER (Patient taking differently: Take 20 mg by mouth daily with supper. ) 30 tablet 6  . MAGNESIUM-OXIDE 400 (241.3 Mg) MG tablet TAKE 1 TABLET BY MOUTH TWICE DAILY (Patient not taking: No sig reported) 90 tablet 0  . pantoprazole (PROTONIX) 40 MG tablet TAKE 1 TABLET BY MOUTH TWICE DAILY (Patient not taking: No sig reported) 90 tablet 0  . Potassium Chloride ER 20 MEQ TBCR TAKE 2 TABLETS BY MOUTH TWICE DAILY (Patient not taking: Reported on 02/26/2019) 120 tablet 0  . predniSONE (DELTASONE) 20 MG tablet Take 2 tablets (40 mg total) by mouth daily. (Patient not taking: Reported on 12/26/2018) 8 tablet 0    Objective: BP 131/73   Pulse 81   Temp 98 F (36.7 C) (Oral)   Resp 20   Ht 5\' 5"  (1.651 m)   Wt (!) 158.8 kg   SpO2 98%   BMI 58.24 kg/m  Exam: General: Well-appearing, no acute distress, resting in bed Eyes: Anicteric sclerae ENTM: Moist mucous membranes Neck:  Supple Cardiovascular: Tachycardic, normal rhythm, no murmur, rub, gallop Respiratory: Audible wheeze without stethoscope from upper airway, clear to auscultation bilaterally, air movement in all lung fields, able to speak in full sentences but did demonstrate increased work of breathing with involvement of abdominal accessory muscles Gastrointestinal: Soft, nontender, nondistended MSK: Tender to palpation on right leg and knee, right shoulder, soft tissue of forehead.  Range of motion limited by pain on right knee and right shoulder. swelling present on right knee.  No erythema, ecchymosis, or warmth. Derm: Warm, dry, intact Neuro: Grossly intact, AO x4,  Psych: Normal affect and mood  Labs and Imaging: CBC BMET  Recent Labs  Lab 02/26/19 0855  WBC 11.6*  HGB 11.6*  HCT 39.5  PLT 518*   Recent Labs  Lab 02/26/19 0855  NA 141  K 3.8  CL 105  CO2 25  BUN 18  CREATININE 0.70  GLUCOSE 118*  CALCIUM 8.9     EKG: Sinus rhythm, non specific t-wave abnormalities and borderline prolonged PR interval unchanged from previous ekg  BNP: 34.5 COVID: neg  Dg Shoulder Right  Result Date: 02/26/2019 CLINICAL DATA:  Right shoulder pain after fall. EXAM: RIGHT SHOULDER - 2+ VIEW COMPARISON:  Radiographs of October 24, 2010. FINDINGS: There is no evidence of fracture or dislocation. Mild degenerative changes seen involving the right acromioclavicular and glenohumeral joints. Soft tissues are unremarkable. IMPRESSION: Mild degenerative joint disease involving the right acromioclavicular and glenohumeral joints. No acute abnormality seen in the right shoulder. Electronically Signed   By: Bobbe Medico.D.  On: 02/26/2019 09:40   Ct Head Wo Contrast  Result Date: 02/26/2019 CLINICAL DATA:  Head trauma, minor, fall with head strike, on anticoagulation. EXAM: CT HEAD WITHOUT CONTRAST TECHNIQUE: Contiguous axial images were obtained from the base of the skull through the vertex without intravenous  contrast. COMPARISON:  CT head 12/31/2007 FINDINGS: Brain: No evidence of acute infarction, hemorrhage, hydrocephalus, extra-axial collection or mass lesion/mass effect. Vascular: No hyperdense vessel or unexpected calcification. Skull: No calvarial fracture or suspicious osseous lesion. No scalp swelling or hematoma. Sinuses/Orbits: Paranasal sinuses and mastoid air cells are predominantly clear. Pneumatization of the petrous apices. Included orbital structures are unremarkable. Debris within the right external auditory canal. Middle ear cavities are clear. Other: None. IMPRESSION: No acute intracranial abnormality. Electronically Signed   By: Lovena Le M.D.   On: 02/26/2019 00:25   Dg Chest Portable 1 View  Result Date: 02/26/2019 CLINICAL DATA:  Wheezing. Additional history: Chest tightness morning, fall at home last night. EXAM: PORTABLE CHEST 1 VIEW COMPARISON:  CT chest 12/26/2018, chest radiographs 12/26/2018 FINDINGS: Stable cardiomediastinal silhouette. Aortic atherosclerosis. Shallow inspiration radiograph. Mild left retrocardiac opacity may reflect atelectasis. No evidence of pleural effusion or pneumothorax. No acute bony abnormality. IMPRESSION: Shallow inspiration radiograph. Mild left retrocardiac opacity may reflect atelectasis. Pneumonia is difficult to exclude. Electronically Signed   By: Kellie Simmering   On: 02/26/2019 08:07   Dg Knee Complete 4 Views Right  Result Date: 02/26/2019 CLINICAL DATA:  Fall, pain, injury. Suprapatellar swelling. EXAM: RIGHT KNEE - COMPLETE 4+ VIEW COMPARISON:  None. FINDINGS: Mild prepatellar soft tissue swelling. No joint effusion. No acute fracture or traumatic malalignment. Moderate tricompartmental degenerative changes are present most pronounced in the patellofemoral and medial femorotibial compartments. Enthesopathic changes are noted at the patellar insertion of the distal quadriceps tendon. Additional spurring noted along the medial femoral condyle.  Remaining soft tissues are unremarkable. IMPRESSION: Mild prepatellar swelling. No effusion or acute osseous abnormality. Moderate tricompartmental degenerative changes most pronounced in the medial and patellofemoral compartments. Electronically Signed   By: Lovena Le M.D.   On: 02/26/2019 00:28   Dg Hip Unilat W Or Wo Pelvis 2-3 Views Right  Result Date: 02/26/2019 CLINICAL DATA:  Right hip pain after fall. EXAM: DG HIP (WITH OR WITHOUT PELVIS) 2-3V RIGHT COMPARISON:  None. FINDINGS: There is no evidence of hip fracture or dislocation. There is no evidence of arthropathy or other focal bone abnormality. IMPRESSION: Negative. Electronically Signed   By: Marijo Conception M.D.   On: 02/26/2019 09:41    Danna Hefty, DO 02/26/2019, 9:35 PM PGY-2, [facility] Intern pager: [phone], text pages welcome  Upper Level Addendum:  I have seen and evaluated this patient along with Dr. Chauncey Reading and reviewed the above note, making necessary revisions in green.   Mina Marble, D.O. 02/26/2019, 9:35 PM PGY-2, [place]  Call Pager [phone] for any questions or notifications regarding this patient  FMTS Attending Admission Note: Dorcas Mcmurray MD Attending pager:[phone] I  have seen and examined this patient, reviewed their chart. I have discussed this patient with the resident. I agree with the resident's findings, assessment and care plan.  1. SOB: she has some mild to moderate expiratory wheezing so I think the most likely etiology of her SOB is COPD exacerbation. We have ordered 2 view CXR to further evaluate the possible opacity seen on portable CXR but without fever, productive cough or other typically infectious associated signs or symptoms I  doubt this is pneumonia. Antibiotics were initiated but I think we can revisit after a 2 view CXR. Her lung exam has sounds including some expiratory wheezes in all  lung fields. 2. Fall: mechanical from  oxygen tubing. She has some mild soft tissue swelling on her forehead but otherwise her head, neck, shoulder, knee exams are without any sign of acute pathology. 3. Hx of PE is noted. I see no need for D Dimer and suspect it would just confuse the issue. She is appropriately anticoagulated and her SOB more consistent with COPD exacerbation as above.

## 2019-02-26 NOTE — Patient Outreach (Signed)
[facility]) Care Management  02/26/2019  [name] White [birth year] [id number]   [place] noted that patient presented to the Emergency Department at [facility] last evening (Monday, February 25, 2019), due to a fall that she sustained in her motel room by tripping over her oxygen tubing.  Patient is currently being evaluated and it remains to be seen whether or not patient will need to be admitted.  The results of patient's Head CT concluded that there are no acute intracranial abnormalities.    CSW received a voicemail message from patient last evening indicating that her friend in [place] paid for her to stay at [facility], where she currently resides, for an additional week.  Therefore, patient is paid up through Wednesday, February 27, 2019.  CSW is concerned about what patient will do after the 2nd because she is not scheduled to receive her [place] check until Wednesday, March 06, 2019.  CSW will continue to follow patient while hospitalized and then resume community case management/social work services once patient is discharged back into the community.  Patient continues to have ongoing housing concerns, reporting that she is unable to pursue permanent housing arrangements due to COVID-19.  CSW has provided patient with all housing resources that CSW is aware of, in numerous counties.  [name], BSW, MSW, LCSW  Licensed Education officer, [facility]  Mailing [place] [address], [place], [place] [address] Physical Address-[address], [place], [place] [address] Toll Free Main # [phone] Fax # [phone] Cell # [phone]  Office # [phone] [name]

## 2019-02-26 NOTE — ED Provider Notes (Signed)
[facility] EMERGENCY DEPARTMENT Provider Note   CSN: [id number] Arrival date & time: 02/25/19  2322     History   Chief Complaint Chief Complaint  Patient presents with  . Fall    HPI [name] White is a 63 y.o. female.     Patient to ED for evaluation of fall. She got up from bed last night and tripped on her oxygen tubing, falling face first onto the floor. No LOC. She hit her head and injured her right knee. She is anticoagulated with Xarelto for history of PE and complains of frontal headache. No nausea or vomiting. She denies chest, neck or abdominal pain.   The history is provided by the patient. No language interpreter was used.  Fall Associated symptoms include headaches.    Past Medical History:  Diagnosis Date  . Anxiety   . Asthma   . Chronic lower back pain   . COPD (chronic obstructive pulmonary disease) ([place])   . Depression   . Family history of anesthesia complication    "daughter; causes her to pass out afterwards"  . GERD (gastroesophageal reflux disease)   . Hypertension   . Hyperthyroidism   . Migraine    "monthly" (12/28/2013)  . Osteoarthritis    "both knees; back of my neck; right pelvic bone" (12/28/2013)  . Paroxysmal A-fib ([place])   . Pulmonary embolism ([place]) 12/28/2013   "2 clots in each lung"    Patient Active Problem List   Diagnosis Date Noted  . Urge incontinence 01/27/2019  . Homelessness 12/30/2018  . Encounter for rehabilitation 12/04/2018  . Exposure to Covid-19 Virus 11/02/2018  . Reactive airway disease 09/10/2018  . Shortness of breath 09/09/2018  . Acute bronchitis 08/29/2018  . Acute on chronic respiratory failure with hypoxia ([place]) 08/29/2018  . Sleep apnea 07/05/2018  . Chronic respiratory failure with hypoxia ([place]) 07/05/2018  . Osteoarthritis 03/02/2018  . Viral illness 02/13/2018  . Nausea 02/13/2018  . Left medial tibial stress syndrome 12/26/2017  . Hematoma 11/27/2017  . Severe episode of  recurrent major depressive disorder, without psychotic features ([place])   . MDD (major depressive disorder), recurrent episode, severe ([place]) 09/07/2015  . Atrial flutter ([place]) 07/29/2015  . History of atrial flutter 06/26/2015  . Chest pain 03/22/2015  . Asthma 11/12/2014  . History of hypertension 11/05/2014  . Atrial fibrillation, chronic 11/03/2014  . Chronic anticoagulation 11/03/2014  . History of pulmonary embolus (PE) 11/03/2014  . Hypokalemia 11/03/2014  . Non-traumatic rhabdomyolysis 11/03/2014  . Major depressive disorder 11/03/2014  . SOB (shortness of breath)   . MDD (major depressive disorder), recurrent severe, without psychosis ([place]) 03/05/2014  . Pulmonary embolism ([place]) 12/27/2013  . Vocal cord dysfunction 11/12/2013  . Mixed headache 01/06/2012  . Hot flashes 04/22/2011  . OVERACTIVE BLADDER 04/18/2008  . Osteoarthritis of both knees 04/18/2008  . POLYARTHRITIS 09/14/2007  . Morbid obesity ([place]) 08/24/2006  . RESTLESS LEGS SYNDROME 08/24/2006  . HYPERTENSION, BENIGN SYSTEMIC 08/24/2006  . RHINITIS, ALLERGIC 08/24/2006  . GASTROESOPHAGEAL REFLUX, NO ESOPHAGITIS 08/24/2006    Past Surgical History:  Procedure Laterality Date  . ABDOMINAL HYSTERECTOMY    . APPENDECTOMY    . BREAST CYST EXCISION Right   . DILATION AND CURETTAGE OF UTERUS    . ELECTROPHYSIOLOGIC STUDY N/A 05/27/2015   Procedure: A-Flutter;  Surgeon: Evans Lance, MD;  Location: [facility];  Service: Cardiovascular;  Laterality: N/A;  . EXCISIONAL HEMORRHOIDECTOMY    . NASAL SINUS SURGERY  2007  . THYROIDECTOMY, PARTIAL Right 2005  . TUBAL LIGATION    . WISDOM TOOTH EXTRACTION       OB History   No obstetric history on file.      Home Medications    Prior to Admission medications   Medication Sig Start Date End Date Taking? Authorizing Provider  acetaminophen (TYLENOL) 325 MG tablet Take 2 tablets (650 mg total) by mouth every 6 (six) hours as needed for mild pain. 09/11/18    Georgette Shell, MD  albuterol (PROVENTIL) (2.5 MG/3ML) 0.083% nebulizer solution Take 3 mLs (2.5 mg total) by nebulization every 6 (six) hours as needed for wheezing or shortness of breath. 07/09/18   Collene Gobble, MD  azelastine (OPTIVAR) 0.05 % ophthalmic solution Place 1 drop into both eyes 2 (two) times daily as needed (itchy eyes).  08/02/18   [provider]  cyclobenzaprine (FLEXERIL) 10 MG tablet TAKE 1 TABLET BY MOUTH EVERY 8 HOURS AS NEEDED FOR PAIN Patient taking differently: Take 10 mg by mouth every 8 (eight) hours as needed (pain).  12/03/18   [name], MD  cycloSPORINE (RESTASIS) 0.05 % ophthalmic emulsion Place 1 drop into both eyes 2 (two) times daily.    [provider]  diclofenac sodium (VOLTAREN) 1 % GEL APPLY 2 GRAMS EXTERNALLY TO THE AFFECTED AREA FOUR TIMES DAILY Patient taking differently: Apply 2 g topically 4 (four) times daily as needed (pain).  07/31/18   [name], MD  diltiazem (CARDIZEM CD) 120 MG 24 hr capsule Take 1 capsule (120 mg total) by mouth daily. Please keep upcoming appt in January before anymore refills. Final Attempt Patient taking differently: Take 120 mg by mouth at bedtime. Please keep upcoming appt in January before anymore refills. Final Attempt 07/04/18   Patsey Berthold, NP  famotidine (PEPCID) 20 MG tablet Take 1 tablet (20 mg total) by mouth daily. 12/22/18   [name], MD  flecainide (TAMBOCOR) 100 MG tablet TAKE 1 TABLET BY MOUTH 2 TIMES A DAY. PLEASE KEEP UPCOMING APPOINTMENT IN JANUARY BEFORE ANYMORE REFILLS. Patient taking differently: Take 100 mg by mouth 2 (two) times daily.  12/03/18   Seiler, Amber K, NP  fluticasone (FLONASE) 50 MCG/ACT nasal spray Place 2 sprays into both nostrils every morning. 10/29/18   [provider]  hydrocortisone (ANUSOL-HC) 25 MG suppository Place 1 suppository (25 mg total) rectally 2 (two) times daily. Patient taking differently: Place 25 mg rectally 2 (two) times daily  as needed for hemorrhoids or anal itching.  08/20/18   [place] Donnamarie Poag, DO  hydrOXYzine (VISTARIL) 25 MG capsule Take 25 mg by mouth 2 (two) times daily as needed for anxiety. 11/26/18   [provider]  loratadine (CLARITIN) 10 MG tablet Take 1 tablet (10 mg total) by mouth daily. 12/22/18   [name], MD  MAGNESIUM-OXIDE 400 (241.3 Mg) MG tablet TAKE 1 TABLET BY MOUTH TWICE DAILY Patient taking differently: Take 400 mg by mouth 2 (two) times daily.  12/07/18   [name], MD  metoprolol tartrate (LOPRESSOR) 25 MG tablet TAKE 1 TABLET BY MOUTH TWICE DAILY Patient taking differently: Take 25 mg by mouth 2 (two) times daily.  12/22/18   [name], MD  OXYGEN 2L at bedtime and prn exertion    [provider]  pantoprazole (PROTONIX) 40 MG tablet TAKE 1 TABLET BY MOUTH TWICE DAILY Patient not taking: No sig reported 12/07/18   [name], MD  Potassium Chloride ER 20 MEQ TBCR TAKE  2 TABLETS BY MOUTH TWICE DAILY 01/10/19   [name], MD  predniSONE (DELTASONE) 20 MG tablet Take 2 tablets (40 mg total) by mouth daily. Patient not taking: Reported on 12/26/2018 12/20/18   Virgel Manifold, MD  traMADol (ULTRAM) 50 MG tablet Take 50 mg by mouth every 8 (eight) hours as needed for moderate pain.  10/29/18   [provider]  venlafaxine XR (EFFEXOR-XR) 150 MG 24 hr capsule Take 150 mg by mouth daily with breakfast.    [provider]  VENTOLIN HFA 108 (90 Base) MCG/ACT inhaler INHALE 2 PUFFS INTO THE LUNGS EVERY 6 HOURS AS NEEDED FOR WHEEZING OR SHORTNESS OF BREATH Patient taking differently: Inhale 2 puffs into the lungs every 6 (six) hours as needed for wheezing or shortness of breath.  10/18/18   [name], MD  XARELTO 20 MG TABS tablet TAKE 1 TABLET(20 MG) BY MOUTH DAILY AFTER SUPPER Patient taking differently: Take 20 mg by mouth daily with supper.  12/03/18   Patsey Berthold, NP    Family History Family History  Problem Relation Age of Onset  .  Osteoarthritis Mother   . Asthma Mother   . Heart failure Mother   . Breast cancer Daughter     Social History Social History   Tobacco Use  . Smoking status: Former Smoker    Packs/day: 0.25    Years: 20.00    Pack years: 5.00    Types: Cigarettes    Quit date: 07/17/1999    Years since quitting: 19.6  . Smokeless tobacco: Never Used  Substance Use Topics  . Alcohol use: Not Currently    Comment: "drank some in my 30's"  . Drug use: No     Allergies   Caffeine, Hydralazine hcl, Hydrocodone, Ciprofloxacin, Erythromycin, Lisinopril, Oxycodone, and Sulfamethoxazole-trimethoprim   Review of Systems Review of Systems  Constitutional: Negative for chills and fever.  Respiratory: Negative.   Cardiovascular: Negative.   Gastrointestinal: Negative.   Musculoskeletal:       See HPI.  Skin: Negative.   Neurological: Positive for headaches.     Physical Exam Updated Vital Signs BP 126/80   Pulse 78   Temp 98 F (36.7 C) (Oral)   Resp 18   Ht 5\' 5"  (1.651 m)   Wt (!) 146.1 kg   SpO2 100%   BMI 53.58 kg/m   Physical Exam Vitals signs and nursing note reviewed.  Constitutional:      General: She is not in acute distress.    Appearance: She is well-developed.  HENT:     Head: Normocephalic and atraumatic.     Comments: No hematoma, swelling or wound to face or scalp. Eyes:     Extraocular Movements: Extraocular movements intact.     Conjunctiva/sclera: Conjunctivae normal.     Pupils: Pupils are equal, round, and reactive to light.  Neck:     Musculoskeletal: Normal range of motion and neck supple.  Cardiovascular:     Rate and Rhythm: Normal rate and regular rhythm.     Heart sounds: No murmur.  Pulmonary:     Effort: Pulmonary effort is normal.     Breath sounds: Normal breath sounds. No wheezing, rhonchi or rales.  Chest:     Chest wall: No tenderness.  Abdominal:     General: Bowel sounds are normal.     Palpations: Abdomen is soft.     Tenderness:  There is no abdominal tenderness. There is no guarding or rebound.  Musculoskeletal: Normal  range of motion.     Comments: Right knee tender to palpation without gross swelling, bony deformity or discoloration.  Skin:    General: Skin is warm and dry.  Neurological:     Mental Status: She is alert and oriented to person, place, and time.      ED Treatments / Results  Labs (all labs ordered are listed, but only abnormal results are displayed) Labs Reviewed  PROTIME-INR - Abnormal; Notable for the following components:      Result Value   Prothrombin Time 19.3 (*)    INR 1.6 (*)    All other components within normal limits  APTT    EKG None  Radiology Ct Head Wo Contrast  Result Date: 02/26/2019 CLINICAL DATA:  Head trauma, minor, fall with head strike, on anticoagulation. EXAM: CT HEAD WITHOUT CONTRAST TECHNIQUE: Contiguous axial images were obtained from the base of the skull through the vertex without intravenous contrast. COMPARISON:  CT head 12/31/2007 FINDINGS: Brain: No evidence of acute infarction, hemorrhage, hydrocephalus, extra-axial collection or mass lesion/mass effect. Vascular: No hyperdense vessel or unexpected calcification. Skull: No calvarial fracture or suspicious osseous lesion. No scalp swelling or hematoma. Sinuses/Orbits: Paranasal sinuses and mastoid air cells are predominantly clear. Pneumatization of the petrous apices. Included orbital structures are unremarkable. Debris within the right external auditory canal. Middle ear cavities are clear. Other: None. IMPRESSION: No acute intracranial abnormality. Electronically Signed   By: Lovena Le M.D.   On: 02/26/2019 00:25   Dg Knee Complete 4 Views Right  Result Date: 02/26/2019 CLINICAL DATA:  Fall, pain, injury. Suprapatellar swelling. EXAM: RIGHT KNEE - COMPLETE 4+ VIEW COMPARISON:  None. FINDINGS: Mild prepatellar soft tissue swelling. No joint effusion. No acute fracture or traumatic malalignment. Moderate  tricompartmental degenerative changes are present most pronounced in the patellofemoral and medial femorotibial compartments. Enthesopathic changes are noted at the patellar insertion of the distal quadriceps tendon. Additional spurring noted along the medial femoral condyle. Remaining soft tissues are unremarkable. IMPRESSION: Mild prepatellar swelling. No effusion or acute osseous abnormality. Moderate tricompartmental degenerative changes most pronounced in the medial and patellofemoral compartments. Electronically Signed   By: Lovena Le M.D.   On: 02/26/2019 00:28    Procedures Procedures (including critical care time)  Medications Ordered in ED Medications - No data to display   Initial Impression / Assessment and Plan / ED Course  I have reviewed the triage vital signs and the nursing notes.  Pertinent labs & imaging results that were available during my care of the patient were reviewed by me and considered in my medical decision making (see chart for details).        Patient to ED for evaluation after mechanical fall, on Xarelto, c/o frontal headache and right knee pain.   Head CT and right knee are negative for evidence of injury. The patient is able to stand and be fully weight bearing on the right knee.   During exam, the patient started coughing and became visibly short of breath, difficulty speaking full sentences. No gross wheezing on auscultation, moving air to all fields. O2 saturation stays normal.   She used her inhaler without significant change. 8 puff Albuterol provided. On re-exam, the patient continues to cough with SOB. Additional orders placed.   Patient care signed out to [facility], PA-C, pending re-evaluation of respiratory status after treatments.   Final Clinical Impressions(s) / ED Diagnoses   Final diagnoses:  None   1. Mechanical fall 2. Coagulopathy 3. SOB  ED Discharge Orders    None       Charlann Lange, Hershal Coria 02/26/19 [id number]     Ripley Fraise, MD 03/01/19 [id number]

## 2019-02-26 NOTE — ED Notes (Signed)
Changed oxygen tank 

## 2019-02-26 NOTE — ED Notes (Signed)
Pt. Noted to be hypoxic at 86% on r/a. Audiably wheezing, Pt. Placed on 3L [place] with improvement to 97% [name] notified. See new orders, will continue to monitor

## 2019-02-26 NOTE — ED Notes (Signed)
ED TO INPATIENT HANDOFF REPORT  ED Nurse Name and Phone #: [phone]  S Name/Age/Gender [name] White 63 y.o. female Room/Bed: 035C/035C  Code Status   Code Status: Full Code  Home/SNF/Other Home Patient oriented to: self, place, time and situation Is this baseline? Yes   Triage Complete: Triage complete  Chief Complaint fall; head pain   Triage Note Patient c/o fall and hitting her head; denies LOC. Pt currently takes Xarelto. Patient tripped on oxygen tubing. A&O x 4, NAD at this time. Currently living in transitional housing to learn to take are of herself (pt also received PT).   Allergies Allergies  Allergen Reactions  . Caffeine Other (See Comments)    Migraine  . Hydralazine Hcl     Other reaction(s): Other (See Comments) AKI leading to rhabdo and electrolyte abnormalities  . Hydrocodone Nausea And Vomiting    Headache  . Ciprofloxacin Hives and Rash  . Erythromycin Hives and Rash  . Lisinopril Cough  . Oxycodone Nausea And Vomiting    Headache  . Sulfamethoxazole-Trimethoprim Rash    Level of Care/Admitting Diagnosis ED Disposition    ED Disposition Condition Comment   Admit  Hospital Area: [facility] [[id number]]  Level of Care: Telemetry Medical [104]  Covid Evaluation: Confirmed COVID Negative  Diagnosis: COPD exacerbation [facility]) [id number]  Admitting Physician: Danna Hefty [id number]  Attending Physician: Dorcas Mcmurray L [[id number]]  PT Class (Do Not Modify): Observation [104]  PT Acc Code (Do Not Modify): Observation [10022]       B Medical/Surgery History Past Medical History:  Diagnosis Date  . Anxiety   . Asthma   . Chronic lower back pain   . COPD (chronic obstructive pulmonary disease) ([place])   . Depression   . Family history of anesthesia complication    "daughter; causes her to pass out afterwards"  . GERD (gastroesophageal reflux disease)   . Hypertension   . Hyperthyroidism   . Migraine    "monthly" (12/28/2013)   . Osteoarthritis    "both knees; back of my neck; right pelvic bone" (12/28/2013)  . Paroxysmal A-fib ([place])   . Pulmonary embolism ([place]) 12/28/2013   "2 clots in each lung"   Past Surgical History:  Procedure Laterality Date  . ABDOMINAL HYSTERECTOMY    . APPENDECTOMY    . BREAST CYST EXCISION Right   . DILATION AND CURETTAGE OF UTERUS    . ELECTROPHYSIOLOGIC STUDY N/A 05/27/2015   Procedure: A-Flutter;  Surgeon: Evans Lance, MD;  Location: [facility];  Service: Cardiovascular;  Laterality: N/A;  . EXCISIONAL HEMORRHOIDECTOMY    . NASAL SINUS SURGERY  2007  . THYROIDECTOMY, PARTIAL Right 2005  . TUBAL LIGATION    . WISDOM TOOTH EXTRACTION       A IV Location/Drains/Wounds Patient Lines/Drains/Airways Status   Active Line/Drains/Airways    Name:   Placement date:   Placement time:   Site:   Days:   Peripheral IV 02/26/19 Right Antecubital   02/26/19    0945    Antecubital   less than 1          Intake/Output Last 24 hours No intake or output data in the 24 hours ending 02/26/19 1536  Labs/Imaging Results for orders placed or performed during the hospital encounter of 02/25/19 (from the past 48 hour(s))  Protime-INR     Status: Abnormal   Collection Time: 02/25/19 11:41 PM  Result Value Ref Range   Prothrombin Time 19.3 (H) 11.4 -  15.2 seconds   INR 1.6 (H) 0.8 - 1.2    Comment: (NOTE) INR goal varies based on device and disease states. Performed at [facility], [place] [address] [place], [place] [address]   APTT     Status: None   Collection Time: 02/25/19 11:41 PM  Result Value Ref Range   aPTT 34 24 - 36 seconds    Comment: Performed at [facility] [address], [place], [place] [address]  CBC with Differential     Status: Abnormal   Collection Time: 02/26/19  8:55 AM  Result Value Ref Range   WBC 11.6 (H) 4.0 - 10.5 K/uL   RBC 4.42 3.87 - 5.11 MIL/uL   Hemoglobin 11.6 (L) 12.0 - 15.0 g/dL   HCT 39.5 36.0 - 46.0 %   MCV 89.4 80.0 -  100.0 fL   MCH 26.2 26.0 - 34.0 pg   MCHC 29.4 (L) 30.0 - 36.0 g/dL   RDW 19.3 (H) 11.5 - 15.5 %   Platelets 518 (H) 150 - 400 K/uL   nRBC 0.0 0.0 - 0.2 %   Neutrophils Relative % 77 %   Neutro Abs 8.9 (H) 1.7 - 7.7 K/uL   Lymphocytes Relative 16 %   Lymphs Abs 1.8 0.7 - 4.0 K/uL   Monocytes Relative 5 %   Monocytes Absolute 0.6 0.1 - 1.0 K/uL   Eosinophils Relative 2 %   Eosinophils Absolute 0.2 0.0 - 0.5 K/uL   Basophils Relative 0 %   Basophils Absolute 0.0 0.0 - 0.1 K/uL   Immature Granulocytes 0 %   Abs Immature Granulocytes 0.04 0.00 - 0.07 K/uL    Comment: Performed at [facility], [place] [address] [place], [place] [address]  Comprehensive metabolic panel     Status: Abnormal   Collection Time: 02/26/19  8:55 AM  Result Value Ref Range   Sodium 141 135 - 145 mmol/L   Potassium 3.8 3.5 - 5.1 mmol/L   Chloride 105 98 - 111 mmol/L   CO2 25 22 - 32 mmol/L   Glucose, Bld 118 (H) 70 - 99 mg/dL   BUN 18 8 - 23 mg/dL   Creatinine, Ser 0.70 0.44 - 1.00 mg/dL   Calcium 8.9 8.9 - 10.3 mg/dL   Total Protein 7.5 6.5 - 8.1 g/dL   Albumin 3.2 (L) 3.5 - 5.0 g/dL   AST 20 15 - 41 U/L   ALT 22 0 - 44 U/L   Alkaline Phosphatase 91 38 - 126 U/L   Total Bilirubin 0.5 0.3 - 1.2 mg/dL   GFR calc non Af Amer >60 >60 mL/min   GFR calc Af Amer >60 >60 mL/min   Anion gap 11 5 - 15    Comment: Performed at [facility] [address] [place], [place] [address]  Brain natriuretic peptide     Status: None   Collection Time: 02/26/19  8:56 AM  Result Value Ref Range   B Natriuretic Peptide 34.5 0.0 - 100.0 pg/mL    Comment: Performed at [facility] [address] [place], [place] [address]  SARS Coronavirus 2 [facility] order, Performed in [facility] hospital lab) Nasopharyngeal Nasopharyngeal Swab     Status: None   Collection Time: 02/26/19 10:35 AM   Specimen: Nasopharyngeal Swab  Result Value Ref Range   SARS Coronavirus 2 NEGATIVE NEGATIVE    Comment: (NOTE) If  result is NEGATIVE SARS-CoV-2 target nucleic acids are NOT DETECTED.  The SARS-CoV-2 RNA is generally detectable in upper and lower  respiratory specimens during the acute phase of infection. The lowest  concentration of SARS-CoV-2 viral copies this assay can detect is 250  copies / mL. A negative result does not preclude SARS-CoV-2 infection  and should not be used as the sole basis for treatment or other  patient management decisions.  A negative result may occur with  improper specimen collection / handling, submission of specimen other  than nasopharyngeal swab, presence of viral mutation(s) within the  areas targeted by this assay, and inadequate number of viral copies  (<250 copies / mL). A negative result must be combined with clinical  observations, patient history, and epidemiological information. If result is POSITIVE SARS-CoV-2 target nucleic acids are DETECTED. The SARS-CoV-2 RNA is generally detectable in upper and lower  respiratory specimens dur ing the acute phase of infection.  Positive  results are indicative of active infection with SARS-CoV-2.  Clinical  correlation with patient history and other diagnostic information is  necessary to determine patient infection status.  Positive results do  not rule out bacterial infection or co-infection with other viruses. If result is PRESUMPTIVE POSTIVE SARS-CoV-2 nucleic acids MAY BE PRESENT.   A presumptive positive result was obtained on the submitted specimen  and confirmed on repeat testing.  While 2019 novel coronavirus  (SARS-CoV-2) nucleic acids may be present in the submitted sample  additional confirmatory testing may be necessary for epidemiological  and / or clinical management purposes  to differentiate between  SARS-CoV-2 and other Sarbecovirus currently known to infect humans.  If clinically indicated additional testing with an alternate test  methodology [phone]) is advised. The SARS-CoV-2 RNA is generally   detectable in upper and lower respiratory sp ecimens during the acute  phase of infection. The expected result is Negative. Fact Sheet for Patients:  StrictlyIdeas.no Fact Sheet for Healthcare Providers: BankingDealers.co.za This test is not yet approved or cleared by the [place] FDA and has been authorized for detection and/or diagnosis of SARS-CoV-2 by FDA under an Emergency Use Authorization (EUA).  This EUA will remain in effect (meaning this test can be used) for the duration of the COVID-19 declaration under Section 564(b)(1) of the Act, 21 U.S.C. section 360bbb-3(b)(1), unless the authorization is terminated or revoked sooner. Performed at [facility], [place] [address], [place], [place] [address]    Dg Shoulder Right  Result Date: 02/26/2019 CLINICAL DATA:  Right shoulder pain after fall. EXAM: RIGHT SHOULDER - 2+ VIEW COMPARISON:  Radiographs of October 24, 2010. FINDINGS: There is no evidence of fracture or dislocation. Mild degenerative changes seen involving the right acromioclavicular and glenohumeral joints. Soft tissues are unremarkable. IMPRESSION: Mild degenerative joint disease involving the right acromioclavicular and glenohumeral joints. No acute abnormality seen in the right shoulder. Electronically Signed   By: Marijo [place] M.D.   On: 02/26/2019 09:40   Ct Head Wo Contrast  Result Date: 02/26/2019 CLINICAL DATA:  Head trauma, minor, fall with head strike, on anticoagulation. EXAM: CT HEAD WITHOUT CONTRAST TECHNIQUE: Contiguous axial images were obtained from the base of the skull through the vertex without intravenous contrast. COMPARISON:  CT head 12/31/2007 FINDINGS: Brain: No evidence of acute infarction, hemorrhage, hydrocephalus, extra-axial collection or mass lesion/mass effect. Vascular: No hyperdense vessel or unexpected calcification. Skull: No calvarial fracture or suspicious osseous lesion. No scalp  swelling or hematoma. Sinuses/Orbits: Paranasal sinuses and mastoid air cells are predominantly clear. Pneumatization of the petrous apices. Included  orbital structures are unremarkable. Debris within the right external auditory canal. Middle ear cavities are clear. Other: None. IMPRESSION: No acute intracranial abnormality. Electronically Signed   By: Lovena Le M.D.   On: 02/26/2019 00:25   Dg Chest Portable 1 View  Result Date: 02/26/2019 CLINICAL DATA:  Wheezing. Additional history: Chest tightness morning, fall at home last night. EXAM: PORTABLE CHEST 1 VIEW COMPARISON:  CT chest 12/26/2018, chest radiographs 12/26/2018 FINDINGS: Stable cardiomediastinal silhouette. Aortic atherosclerosis. Shallow inspiration radiograph. Mild left retrocardiac opacity may reflect atelectasis. No evidence of pleural effusion or pneumothorax. No acute bony abnormality. IMPRESSION: Shallow inspiration radiograph. Mild left retrocardiac opacity may reflect atelectasis. Pneumonia is difficult to exclude. Electronically Signed   By: Kellie Simmering   On: 02/26/2019 08:07   Dg Knee Complete 4 Views Right  Result Date: 02/26/2019 CLINICAL DATA:  Fall, pain, injury. Suprapatellar swelling. EXAM: RIGHT KNEE - COMPLETE 4+ VIEW COMPARISON:  None. FINDINGS: Mild prepatellar soft tissue swelling. No joint effusion. No acute fracture or traumatic malalignment. Moderate tricompartmental degenerative changes are present most pronounced in the patellofemoral and medial femorotibial compartments. Enthesopathic changes are noted at the patellar insertion of the distal quadriceps tendon. Additional spurring noted along the medial femoral condyle. Remaining soft tissues are unremarkable. IMPRESSION: Mild prepatellar swelling. No effusion or acute osseous abnormality. Moderate tricompartmental degenerative changes most pronounced in the medial and patellofemoral compartments. Electronically Signed   By: Lovena Le M.D.   On: 02/26/2019 00:28    Dg Hip Unilat W Or Wo Pelvis 2-3 Views Right  Result Date: 02/26/2019 CLINICAL DATA:  Right hip pain after fall. EXAM: DG HIP (WITH OR WITHOUT PELVIS) 2-3V RIGHT COMPARISON:  None. FINDINGS: There is no evidence of hip fracture or dislocation. There is no evidence of arthropathy or other focal bone abnormality. IMPRESSION: Negative. Electronically Signed   By: Marijo [place] M.D.   On: 02/26/2019 09:41    Pending Labs Unresulted Labs (From admission, onward)    Start     Ordered   02/27/19 [id number]  Basic metabolic panel  Tomorrow morning,   R     02/26/19 1455   02/27/19 0500  CBC  Tomorrow morning,   R     02/26/19 1455   02/26/19 1450  HIV antibody  Once,   STAT     02/26/19 1455   02/26/19 0856  Urinalysis, Routine w reflex microscopic  ONCE - STAT,   STAT     02/26/19 0855          Vitals/Pain Today's Vitals   02/26/19 1315 02/26/19 1330 02/26/19 1345 02/26/19 1430  BP: (!) 103/51 124/89 121/89 (!) 111/96  Pulse: (!) 111 (!) 117 (!) 117 (!) 107  Resp: (!) 33 18 (!) 23 20  Temp:      TempSrc:      SpO2: 98% 100% 94% 97%  Weight:      Height:      PainSc:        Isolation Precautions No active isolations  Medications Medications  flecainide (TAMBOCOR) tablet 100 mg (has no administration in time range)  loratadine (CLARITIN) tablet 10 mg (10 mg Oral Given 02/26/19 1223)  metoprolol tartrate (LOPRESSOR) tablet 25 mg (25 mg Oral Given 02/26/19 1233)  hydrOXYzine (VISTARIL) capsule 25 mg (has no administration in time range)  venlafaxine XR (EFFEXOR-XR) 24 hr capsule 150 mg (has no administration in time range)  cycloSPORINE (RESTASIS) 0.05 % ophthalmic emulsion 1 drop (has no administration in time range)  diltiazem (CARDIZEM CD) 24 hr capsule 120 mg (has no administration in time range)  famotidine (PEPCID) tablet 20 mg (has no administration in time range)  rivaroxaban (XARELTO) tablet 20 mg (has no administration in time range)  umeclidinium-vilanterol (ANORO ELLIPTA)  62.5-25 MCG/INH 1 puff (has no administration in time range)  albuterol (PROVENTIL) (2.5 MG/3ML) 0.083% nebulizer solution 2.5 mg (has no administration in time range)  predniSONE (DELTASONE) tablet 40 mg (has no administration in time range)  albuterol (VENTOLIN HFA) 108 (90 Base) MCG/ACT inhaler 8 puff (8 puffs Inhalation Given 02/26/19 0638)  albuterol (VENTOLIN HFA) 108 (90 Base) MCG/ACT inhaler 8 puff (8 puffs Inhalation Given 02/26/19 0736)  ipratropium (ATROVENT HFA) inhaler 2 puff (2 puffs Inhalation Given 02/26/19 0849)  predniSONE (DELTASONE) tablet 60 mg (60 mg Oral Given 02/26/19 0759)  cefTRIAXone (ROCEPHIN) 1 g in sodium chloride 0.9 % 100 mL IVPB (0 g Intravenous Stopped 02/26/19 1040)  doxycycline (VIBRA-TABS) tablet 100 mg (100 mg Oral Given 02/26/19 0953)  sodium chloride 0.9 % bolus 1,000 mL (1,000 mLs Intravenous New Bag/Given 02/26/19 0953)  albuterol (VENTOLIN HFA) 108 (90 Base) MCG/ACT inhaler 4 puff (4 puffs Inhalation Given 02/26/19 1207)  acetaminophen (TYLENOL) tablet 325 mg (325 mg Oral Given 02/26/19 1223)  albuterol (PROVENTIL,VENTOLIN) solution continuous neb (10 mg/hr Nebulization Given 02/26/19 1229)    Mobility walks with person assist High fall risk   Focused Assessments Pulmonary Assessment Handoff:  Lung sounds: Bilateral Breath Sounds: Diminished O2 Device: Nasal Cannula O2 Flow Rate (L/min): 2 L/min      R Recommendations: See Admitting Provider Note  Report given to:   Additional Notes:

## 2019-02-27 ENCOUNTER — Encounter (HOSPITAL_COMMUNITY): Payer: Self-pay | Admitting: General Practice

## 2019-02-27 ENCOUNTER — Observation Stay (HOSPITAL_COMMUNITY): Payer: Medicare HMO

## 2019-02-27 DIAGNOSIS — R05 Cough: Secondary | ICD-10-CM | POA: Diagnosis not present

## 2019-02-27 DIAGNOSIS — M1990 Unspecified osteoarthritis, unspecified site: Secondary | ICD-10-CM | POA: Diagnosis not present

## 2019-02-27 DIAGNOSIS — Z79899 Other long term (current) drug therapy: Secondary | ICD-10-CM | POA: Diagnosis not present

## 2019-02-27 DIAGNOSIS — R0602 Shortness of breath: Secondary | ICD-10-CM | POA: Diagnosis not present

## 2019-02-27 DIAGNOSIS — R6889 Other general symptoms and signs: Secondary | ICD-10-CM | POA: Diagnosis not present

## 2019-02-27 DIAGNOSIS — J441 Chronic obstructive pulmonary disease with (acute) exacerbation: Secondary | ICD-10-CM | POA: Diagnosis not present

## 2019-02-27 DIAGNOSIS — F419 Anxiety disorder, unspecified: Secondary | ICD-10-CM | POA: Diagnosis not present

## 2019-02-27 DIAGNOSIS — K219 Gastro-esophageal reflux disease without esophagitis: Secondary | ICD-10-CM | POA: Diagnosis not present

## 2019-02-27 DIAGNOSIS — Z7901 Long term (current) use of anticoagulants: Secondary | ICD-10-CM | POA: Diagnosis not present

## 2019-02-27 DIAGNOSIS — Z86711 Personal history of pulmonary embolism: Secondary | ICD-10-CM | POA: Diagnosis not present

## 2019-02-27 DIAGNOSIS — Z20828 Contact with and (suspected) exposure to other viral communicable diseases: Secondary | ICD-10-CM | POA: Diagnosis not present

## 2019-02-27 LAB — CBC
HCT: 37.4 % (ref 36.0–46.0)
Hemoglobin: 11.1 g/dL — ABNORMAL LOW (ref 12.0–15.0)
MCH: 26.5 pg (ref 26.0–34.0)
MCHC: 29.7 g/dL — ABNORMAL LOW (ref 30.0–36.0)
MCV: 89.3 fL (ref 80.0–100.0)
Platelets: 500 10*3/uL — ABNORMAL HIGH (ref 150–400)
RBC: 4.19 MIL/uL (ref 3.87–5.11)
RDW: 19.4 % — ABNORMAL HIGH (ref 11.5–15.5)
WBC: 10.8 10*3/uL — ABNORMAL HIGH (ref 4.0–10.5)
nRBC: 0 % (ref 0.0–0.2)

## 2019-02-27 LAB — BASIC METABOLIC PANEL
Anion gap: 12 (ref 5–15)
BUN: 14 mg/dL (ref 8–23)
CO2: 25 mmol/L (ref 22–32)
Calcium: 8.7 mg/dL — ABNORMAL LOW (ref 8.9–10.3)
Chloride: 103 mmol/L (ref 98–111)
Creatinine, Ser: 0.61 mg/dL (ref 0.44–1.00)
GFR calc Af Amer: >60 mL/min (ref >60)
GFR calc non Af Amer: >60 mL/min (ref >60)
Glucose, Bld: 131 mg/dL — ABNORMAL HIGH (ref 70–99)
Potassium: 3.8 mmol/L (ref 3.5–5.1)
Sodium: 140 mmol/L (ref 135–145)

## 2019-02-27 LAB — GLUCOSE, CAPILLARY
Glucose-Capillary: 141 mg/dL — ABNORMAL HIGH (ref 70–99)
Glucose-Capillary: 95 mg/dL (ref 70–99)
Glucose-Capillary: 118 mg/dL — ABNORMAL HIGH (ref 70–99)

## 2019-02-27 LAB — HIV ANTIBODY (ROUTINE TESTING W REFLEX): HIV Screen 4th Generation wRfx: NONREACTIVE

## 2019-02-27 MED ORDER — SODIUM CHLORIDE 0.9 % IV SOLN
INTRAVENOUS | Status: DC | PRN
  Administered 2019-02-27: 1000 mL via INTRAVENOUS

## 2019-02-27 MED ORDER — UMECLIDINIUM BROMIDE 62.5 MCG/INH IN AEPB
1.0000 | INHALATION_SPRAY | Freq: Every day | RESPIRATORY_TRACT | Status: DC
  Filled 2019-02-27: qty 7

## 2019-02-27 MED ORDER — ASPIRIN-ACETAMINOPHEN-CAFFEINE 250-250-65 MG PO TABS
1.0000 | ORAL_TABLET | Freq: Three times a day (TID) | ORAL | Status: DC | PRN
  Administered 2019-02-27 (×2): 1 via ORAL
  Filled 2019-02-27 (×3): qty 1

## 2019-02-27 MED ORDER — HYDROXYZINE HCL 25 MG PO TABS
25.0000 mg | ORAL_TABLET | Freq: Two times a day (BID) | ORAL | Status: DC | PRN
  Administered 2019-02-27: 25 mg via ORAL
  Filled 2019-02-27: qty 1

## 2019-02-27 MED ORDER — UMECLIDINIUM BROMIDE 62.5 MCG/INH IN AEPB
1.0000 | INHALATION_SPRAY | Freq: Every day | RESPIRATORY_TRACT | Status: DC
  Administered 2019-02-28: 1 via RESPIRATORY_TRACT
  Filled 2019-02-27: qty 7

## 2019-02-27 MED ORDER — DICLOFENAC SODIUM 1 % TD GEL
4.0000 g | Freq: Four times a day (QID) | TRANSDERMAL | Status: DC
  Administered 2019-02-27 (×3): 4 g via TOPICAL
  Filled 2019-02-27: qty 100

## 2019-02-27 NOTE — NC FL2 (Signed)
[place] MEDICAID FL2 LEVEL OF CARE SCREENING TOOL     IDENTIFICATION  Patient Name: [name] White Birthdate: [birth year] Sex: female Admission Date (Current Location): 02/25/2019  [facility] and [place] Number:  Herbalist and Address:  [facility] [place], [place] [address] [place], [place] [address]      Provider Number: [id number]  Attending Physician Name and Address:  Dickie La, MD  Relative Name and Phone Number:  Hartford Poli ([phone]    Current Level of Care: Hospital Recommended Level of Care: [place] Prior Approval Number:    Date Approved/Denied:   PASRR Number:    Discharge Plan: SNF    Current Diagnoses: Patient Active Problem List   Diagnosis Date Noted  . COPD exacerbation ([place]) 02/26/2019  . Urge incontinence 01/27/2019  . Homelessness 12/30/2018  . Encounter for rehabilitation 12/04/2018  . Exposure to Covid-19 Virus 11/02/2018  . Reactive airway disease 09/10/2018  . Shortness of breath 09/09/2018  . Acute bronchitis 08/29/2018  . Acute on chronic respiratory failure with hypoxia ([place]) 08/29/2018  . Sleep apnea 07/05/2018  . Chronic respiratory failure with hypoxia ([place]) 07/05/2018  . Osteoarthritis 03/02/2018  . Viral illness 02/13/2018  . Nausea 02/13/2018  . Left medial tibial stress syndrome 12/26/2017  . Hematoma 11/27/2017  . Severe episode of recurrent major depressive disorder, without psychotic features ([place])   . MDD (major depressive disorder), recurrent episode, severe ([place]) 09/07/2015  . Atrial flutter ([place]) 07/29/2015  . History of atrial flutter 06/26/2015  . Chest pain 03/22/2015  . Asthma 11/12/2014  . History of hypertension 11/05/2014  . Atrial fibrillation, chronic 11/03/2014  . Chronic anticoagulation 11/03/2014  . History of pulmonary embolus (PE) 11/03/2014  . Hypokalemia 11/03/2014  . Non-traumatic rhabdomyolysis 11/03/2014  . Major depressive disorder 11/03/2014   . SOB (shortness of breath)   . MDD (major depressive disorder), recurrent severe, without psychosis ([place]) 03/05/2014  . Pulmonary embolism ([place]) 12/27/2013  . Vocal cord dysfunction 11/12/2013  . Mixed headache 01/06/2012  . Hot flashes 04/22/2011  . OVERACTIVE BLADDER 04/18/2008  . Osteoarthritis of both knees 04/18/2008  . POLYARTHRITIS 09/14/2007  . Morbid obesity ([place]) 08/24/2006  . RESTLESS LEGS SYNDROME 08/24/2006  . HYPERTENSION, BENIGN SYSTEMIC 08/24/2006  . RHINITIS, ALLERGIC 08/24/2006  . GASTROESOPHAGEAL REFLUX, NO ESOPHAGITIS 08/24/2006    Orientation RESPIRATION BLADDER Height & Weight     Self, Time, Situation, Place  O2(2L/min) Incontinent, External catheter Weight: (!) 347 lb 14.2 oz (157.8 kg) Height:  5\' 5"  (165.1 cm)  BEHAVIORAL SYMPTOMS/MOOD NEUROLOGICAL BOWEL NUTRITION STATUS      Continent Diet(see discharge summary)  AMBULATORY STATUS COMMUNICATION OF NEEDS Skin   Limited Assist Verbally Normal                       Personal Care Assistance Level of Assistance  Bathing, Feeding, Dressing, Total care Bathing Assistance: Limited assistance Feeding assistance: Independent Dressing Assistance: Limited assistance Total Care Assistance: Limited assistance   Functional Limitations Info  Sight, Hearing, Speech Sight Info: Impaired Hearing Info: Adequate Speech Info: Adequate    SPECIAL CARE FACTORS FREQUENCY  PT (By licensed PT), OT (By licensed OT)     PT Frequency: min 5x weekly OT Frequency: min 5x weekly            Contractures Contractures Info: Not present    Additional Factors Info  Code Status, Allergies Code Status Info: full Allergies Info: Caffeine, Hydralazine Hcl, Hydrocodone, Ciprofloxacin,  Erythromycin, Lisinopril, Oxycodone, Sulfamethoxazoletrimethoprim           Current Medications (02/27/2019):  This is the current hospital active medication list Current Facility-Administered Medications  Medication Dose Route  Frequency Provider Last Rate Last Dose  . 0.9 %  sodium chloride infusion   Intravenous PRN Dickie La, MD   Stopped at 02/27/19 1027  . acetaminophen (TYLENOL) tablet 650 mg  650 mg Oral Q6H PRN Mullis, Kiersten P, DO   650 mg at 02/27/19 0456  . albuterol (PROVENTIL) (2.5 MG/3ML) 0.083% nebulizer solution 2.5 mg  2.5 mg Nebulization Q2H PRN Mullis, Kiersten P, DO      . aspirin-acetaminophen-caffeine (EXCEDRIN MIGRAINE) per tablet 1 tablet  1 tablet Oral Q8H PRN Leeanne Rio, MD      . cycloSPORINE (RESTASIS) 0.05 % ophthalmic emulsion 1 drop  1 drop Both Eyes BID Mullis, Kiersten P, DO   1 drop at 02/27/19 0921  . diltiazem (CARDIZEM CD) 24 hr capsule 120 mg  120 mg Oral QHS Mullis, Kiersten P, DO   120 mg at 02/26/19 2122  . doxycycline (VIBRA-TABS) tablet 100 mg  100 mg Oral Q12H Mullis, Kiersten P, DO   100 mg at 02/27/19 0913  . famotidine (PEPCID) tablet 20 mg  20 mg Oral Daily Mullis, Kiersten P, DO   20 mg at 02/27/19 0913  . flecainide (TAMBOCOR) tablet 100 mg  100 mg Oral BID Mullis, Kiersten P, DO   100 mg at 02/27/19 0920  . hydrOXYzine (VISTARIL) capsule 25 mg  25 mg Oral BID PRN Mullis, Kiersten P, DO      . loratadine (CLARITIN) tablet 10 mg  10 mg Oral Daily Mullis, Kiersten P, DO   10 mg at 02/27/19 0914  . metoprolol tartrate (LOPRESSOR) tablet 25 mg  25 mg Oral BID Mullis, Kiersten P, DO   25 mg at 02/27/19 0914  . predniSONE (DELTASONE) tablet 40 mg  40 mg Oral Q breakfast Mullis, Kiersten P, DO   40 mg at 02/27/19 0913  . rivaroxaban (XARELTO) tablet 20 mg  20 mg Oral Q supper Mullis, Kiersten P, DO   20 mg at 02/26/19 1659  . [START ON 02/28/2019] umeclidinium bromide (INCRUSE ELLIPTA) 62.5 MCG/INH 1 puff  1 puff Inhalation Daily Dickie La, MD      . venlafaxine XR (EFFEXOR-XR) 24 hr capsule 150 mg  150 mg Oral Q breakfast Mullis, Kiersten P, DO   150 mg at 02/27/19 [id number]     Discharge Medications: Please see discharge summary for a list of discharge  medications.  Relevant Imaging Results:  Relevant Lab Results:   Additional Information SSN: [id number]  Alberteen Sam, LCSW

## 2019-02-27 NOTE — Progress Notes (Signed)
Physical Therapy Treatment Patient Details Name: [name] White MRN: [id number] DOB: 1955-11-29 Today's Date: 02/27/2019    History of Present Illness Pt is a 63 y/o female admitted following fall and increased SOB likely from COPD exacerbation. PMH includes asthma, COPD, HTN, a fib, and PE.     PT Comments    Pt is scheduled for transition to motel due to insurance issues, and is demonstrating so much improvement from last PT note.  Pt is up walking with help and close guard chair, would still recommend her to have SNF placement if possible.  Follow acutely to work on strengthening and balance control until [place].    Follow Up Recommendations  SNF;Supervision for mobility/OOB     Equipment Recommendations  Rolling walker with 5" wheels    Recommendations for Other Services       Precautions / Restrictions Precautions Precautions: Fall Precaution Comments: fell on O2 line at home Restrictions Weight Bearing Restrictions: No    Mobility  Bed Mobility Overal bed mobility: Needs Assistance Bed Mobility: Rolling;Supine to Sit Rolling: Supervision   Supine to sit: Min guard     General bed mobility comments: breathing cooperated for transition to sit  Transfers Overall transfer level: Needs assistance Equipment used: Rolling walker (2 wheeled);1 person hand held assist Transfers: Sit to/from Stand Sit to Stand: Min assist         General transfer comment: min with cues for hand placement every trial  Ambulation/Gait Ambulation/Gait assistance: Min guard Gait Distance (Feet): 40 [id number]) Assistive device: Rolling walker (2 wheeled);1 person hand held assist Gait Pattern/deviations: Step-through pattern;Wide base of support;Trunk flexed Gait velocity: reduced Gait velocity interpretation: <1.31 ft/sec, indicative of household ambulator General Gait Details: pt is offloading onto LLE due to pain   Stairs             Wheelchair Mobility    Modified  [place] (Stroke Patients Only)       Balance Overall balance assessment: Needs assistance Sitting-balance support: Feet supported Sitting balance-Leahy Scale: Good     Standing balance support: Bilateral upper extremity supported;During functional activity Standing balance-Leahy Scale: Poor                              Cognition Arousal/Alertness: Awake/alert Behavior During Therapy: WFL for tasks assessed/performed Overall Cognitive Status: Within Functional Limits for tasks assessed                                        Exercises      General Comments General comments (skin integrity, edema, etc.): Pt was up to walk with RW, stood at sink but required sitting rest to complete tooth brushing      Pertinent Vitals/Pain Pain Assessment: Faces Faces Pain Scale: Hurts even more Pain Location: R knee and thigh Pain Descriptors / Indicators: Grimacing Pain Intervention(s): Limited activity within patient's tolerance;Monitored during session;Premedicated before session;Repositioned    Home Living                      Prior Function            PT Goals (current goals can now be found in the care plan section) Acute Rehab PT Goals Patient Stated Goal: to be able to walk and breathe better PT Goal Formulation: With patient Time For Goal Achievement: 03/12/19 Progress  towards PT goals: Progressing toward goals    Frequency    Min 3X/week      PT Plan Current plan remains appropriate    Co-evaluation              AM-PAC PT "6 Clicks" Mobility   Outcome Measure  Help needed turning from your back to your side while in a flat bed without using bedrails?: A Little Help needed moving from lying on your back to sitting on the side of a flat bed without using bedrails?: A Little Help needed moving to and from a bed to a chair (including a wheelchair)?: A Lot Help needed standing up from a chair using your arms (e.g.,  wheelchair or bedside chair)?: A Lot Help needed to walk in hospital room?: A Little Help needed climbing 3-5 steps with a railing? : A Lot 6 Click Score: 15    End of Session Equipment Utilized During Treatment: Oxygen Activity Tolerance: Patient limited by fatigue;Treatment limited secondary to medical complications (Comment) Patient left: in chair;with call bell/phone within reach;with chair alarm set Nurse Communication: Mobility status PT Visit Diagnosis: Muscle weakness (generalized) (M62.81);Difficulty in walking, not elsewhere classified (R26.2);History of falling (Z91.81);Repeated falls (R29.6);Pain Pain - Right/Left: Right Pain - part of body: Knee;Leg     Time: [id number] PT Time Calculation (min) (ACUTE ONLY): 28 min  Charges:  $Gait Training: 8-22 mins $Therapeutic Activity: 8-22 mins                 Ramond Dial 02/27/2019, 4:09 PM   Mee Hives, PT MS Acute Rehab Dept. Number: [place] and [place]

## 2019-02-27 NOTE — Evaluation (Signed)
Occupational Therapy Evaluation Patient Details Name: [name] White MRN: [id number] DOB: 03/25/1956 Today's Date: 02/27/2019    History of Present Illness Pt is a 63 y/o female admitted following fall and increased SOB likely from COPD exacerbation. PMH includes asthma, COPD, HTN, a fib, and PE.    Clinical Impression   PTA patient reports independent with ADLs, mobility using cane, limited IADLs.  Admitted for above and limited by problem list below, including decreased activity tolerance, impaired balance. Patient currently requires min assist for transfers using RW, min assist for LB ADLs and min guard for grooming tasks standing.  Initially on 2L supplemental O2, removed for ADLs/mobility and saturations maintained throughout session-  RN notified O2 off upon exit.  Based on performance today, patient will benefit from continued OT services while admitted and after dc at SNF level in order to maximize independence, safety and tolerance to activity.      Follow Up Recommendations  SNF;Supervision/Assistance - 24 hour    Equipment Recommendations  None recommended by OT    Recommendations for Other Services       Precautions / Restrictions Precautions Precautions: Fall Precaution Comments: fell on O2 line at home Restrictions Weight Bearing Restrictions: No      Mobility Bed Mobility Overal bed mobility: Needs Assistance Bed Mobility: Rolling;Supine to Sit Rolling: Supervision   Supine to sit: Min guard     General bed mobility comments: EOB upon entry  Transfers Overall transfer level: Needs assistance Equipment used: Rolling walker (2 wheeled) Transfers: Sit to/from Stand Sit to Stand: Min assist         General transfer comment: min assist to power up, cueing for hand placement and safety using RW     Balance Overall balance assessment: Needs assistance Sitting-balance support: Feet supported Sitting balance-Leahy Scale: Good     Standing balance  support: Bilateral upper extremity supported;During functional activity;Single extremity supported Standing balance-Leahy Scale: Poor Standing balance comment: able to stand statically with 1 UE support during grooming with min guard, relaint on BUE support dynamically                           ADL either performed or assessed with clinical judgement   ADL Overall ADL's : Needs assistance/impaired     Grooming: Min guard;Standing   Upper Body Bathing: Set up;Sitting   Lower Body Bathing: Minimal assistance;Sit to/from stand   Upper Body Dressing : Set up;Sitting   Lower Body Dressing: Minimal assistance;Sit to/from stand   Toilet Transfer: Minimal assistance;Ambulation;RW   Toileting- Clothing Manipulation and Hygiene: Minimal assistance;Sit to/from stand       Functional mobility during ADLs: Minimal assistance;Rolling walker;Cueing for safety General ADL Comments: pt limited by decreased act tolerance     Vision   Vision Assessment?: No apparent visual deficits     Perception     Praxis      Pertinent Vitals/Pain Pain Assessment: Faces Faces Pain Scale: Hurts even more Pain Location: R knee and thigh Pain Descriptors / Indicators: Grimacing Pain Intervention(s): Monitored during session;Repositioned     Hand Dominance     Extremity/Trunk Assessment Upper Extremity Assessment Upper Extremity Assessment: Overall WFL for tasks assessed   Lower Extremity Assessment Lower Extremity Assessment: Defer to PT evaluation       Communication Communication Communication: No difficulties   Cognition Arousal/Alertness: Awake/alert Behavior During Therapy: WFL for tasks assessed/performed Overall Cognitive Status: Within Functional Limits for tasks assessed  General Comments  initally on 2L supplemental O2 via [place], MD requested pt to remove O2 and saturations maintained >92% during self care grooming and  mobility throughout session--RN notified O2 was OFF at completion of session    Exercises     Shoulder Instructions      Home Living Family/patient expects to be discharged to:: Other (Comment)(motel/transitional housing ) Living Arrangements: Alone Available Help at Discharge: Friend(s);Available PRN/intermittently Type of Home: Other(Comment)(motel) Home Access: Level entry     Home Layout: One level     Bathroom Shower/Tub: Occupational psychologist: Standard     Home Equipment: Cane - single point;Walker - 2 wheels;Shower seat - built in;Adaptive equipment(reports having 3:1 and tub bench in storage?) Adaptive Equipment: Reacher;Sock aid;Long-handled shoe horn;Long-handled sponge        Prior Functioning/Environment Level of Independence: Independent with assistive device(s)        Comments: independent ADLs (used AE for LB self care), mobility with cane; limited IADls (reports having to wear home O2 for iADLs)        OT Problem List: Decreased activity tolerance;Impaired balance (sitting and/or standing);Decreased safety awareness;Decreased knowledge of use of DME or AE;Decreased knowledge of precautions;Cardiopulmonary status limiting activity;Obesity;Pain      OT Treatment/Interventions: Self-care/ADL training;DME and/or AE instruction;Energy conservation;Therapeutic activities;Balance training;Patient/family education;Therapeutic exercise    OT Goals(Current goals can be found in the care plan section) Acute Rehab OT Goals Patient Stated Goal: to get stronger  OT Goal Formulation: With patient Time For Goal Achievement: 03/13/19 Potential to Achieve Goals: Good  OT Frequency: Min 2X/week   Barriers to D/C:            Co-evaluation PT/OT/SLP Co-Evaluation/Treatment: Yes Reason for Co-Treatment: For patient/therapist safety;To address functional/ADL transfers   OT goals addressed during session: ADL's and self-care      AM-PAC OT "6 Clicks"  Daily Activity     Outcome Measure Help from another person eating meals?: None Help from another person taking care of personal grooming?: A Little Help from another person toileting, which includes using toliet, bedpan, or urinal?: A Little Help from another person bathing (including washing, rinsing, drying)?: A Little Help from another person to put on and taking off regular upper body clothing?: A Little Help from another person to put on and taking off regular lower body clothing?: A Little 6 Click Score: 19   End of Session Equipment Utilized During Treatment: Rolling walker;Oxygen Nurse Communication: Mobility status;Other (comment)(O2 )  Activity Tolerance: Patient tolerated treatment well Patient left: in chair;with call bell/phone within reach;with chair alarm set  OT Visit Diagnosis: Unsteadiness on feet (R26.81);Muscle weakness (generalized) (M62.81);History of falling (Z91.81);Pain Pain - Right/Left: Right Pain - part of body: Leg                Time: 1440-1509 OT Time Calculation (min): 29 min Charges:  OT General Charges $OT Visit: 1 Visit OT Evaluation $OT Eval Moderate Complexity: [place], OT Acute Rehabilitation Services Pager [phone] Office [phone]   Delight Stare 02/27/2019, 4:54 PM

## 2019-02-27 NOTE — Progress Notes (Signed)
Nutrition Brief Note RD working remotely.  RD consulted for assessment secondary to COPD GOLD protocol.  Wt Readings from Last 15 Encounters:  02/27/19 (!) 157.8 kg  09/13/18 (!) 147.4 kg  08/29/18 136.1 kg  08/08/18 (!) 137.7 kg  07/18/18 (!) 137 kg  07/05/18 (!) 136.5 kg  07/04/18 (!) 136.5 kg  07/01/18 132.5 kg  02/09/18 136.1 kg  12/26/17 (!) 136.4 kg  11/29/17 (!) 136.9 kg  11/27/17 (!) 136.2 kg  02/04/17 117.9 kg  10/03/16 118.8 kg  09/06/15 104.8 kg   [name] White is a 63 y.o. female presenting with worsening SOB. PMH is significant for HTN, GERD, chronic atrial fibrillation, history of PE (2015), COPD, chronic respiratory failure on 2L O2,  asthma, MDD.  Pt admitted with COPD exacerbation.   Reviewed I/O's: -360 ml x 24 hours  UOP: 1.1 L x 24 hours  Medications reviewed and include prednisone.   Labs reviewed: CBGS: 117.   Body mass index is 57.89 kg/m. Patient meets criteria for extreme obesity, class III based on current BMI.   Current diet order is Heart Healthy, patient is consuming approximately 100% of meals at this time. Labs and medications reviewed.   No nutrition interventions warranted at this time. If nutrition issues arise, please consult RD.   Fotini Lemus A. Jimmye Norman, RD, LDN, [place] Registered Dietitian II Certified Diabetes Care and Education Specialist Pager: [phone] After hours Pager: [phone]

## 2019-02-27 NOTE — Progress Notes (Signed)
Family Medicine Teaching Service Daily Progress Note Intern Pager: [phone]  Patient name: [name] White Medical record number: [id number] Date of birth: 1956/04/03 Age: 63 y.o. Gender: female  Primary Care Provider: [name], MD Consultants: None Code Status: Full  Pt Overview and Major Events to Date:  9/1- admitted  Assessment and Plan: MYREL BOUFFARD is a 63 y.o. female presenting with worsening SOB. PMH is significant for HTN, GERD, chronic atrial fibrillation, history of PE (2015), COPD, chronic respiratory failure on 2L O2,  asthma, MDD.  Shortness of Breath  H\o COPD  Patient with new wet nonproductive cough and chest tightness in the setting of COPD and increased oxygen requirement (baseline 2L O2 only at night) and difficulty taking a deep breath.  Today she reports still having trouble taking a deep breath, but still cough is nonproductive. Chest x-ray reveals a "mild left retrocardiac opacity may reflect atelectasis, pneumonia is difficult to exclude."  In the ED, she received Ventolin nebulizer, Rocephin 1g x1, and doxycycline 100 mg x1 as she is allergic to azithromycin for COPD exacerbation and possible CAP. O2 saturation 96% on 2L [place], RR 22, tachycardic to 105, BP 132/69. Most likely etiology is an exacerbation of her COPD. Possible PNA, however she is afebrile with only mild leukocytosis of 11.6, 11.1 today, and tachycardia is likely due to the albuterol.  These symptoms are less likely to be due to CHF as patient does not have a h/o CHF, her BNP was WNL to 34.5, and her volume status appears to be at baseline with nonpitting edema to mid shins. These symptoms are also not likely due to an arrhythmia as her EKG reveals a normal rhythm; however, due to borderline prolonged PR interval awaiting repeat EKG today. This patient does have a history of PE however she is anticoagulated appropriately, she has no vital signs indicative of a PE and only very mild tachycardia to  103-105 explained by many uses of albuterol today, and patient states that this does not feel like her previous experience with PE.  Patient has a Wells score of 3, indicating moderate likelihood. Per chart review, does appear she presented with similar symptoms at time of presentation for pulmonary emboli in 2015. Risk factors for her PE at that time included exposure to estrogen replacement and increased sedentary lifestyle 2/2 to achilles tendon injury. However, due to her previously mentioned anticoagulated status on Xarelto and lack of clinical findings, PE is lower on our differential diagnosis at this time.  We are considering obtaining a d-dimer vs V/Q scan, however since she is already anticoagulated we will hold off at this time and discuss.  Her home meds include an albuterol nebulizer and a Ventolin inhaler as needed. - admit to FPTS, med-surg telemetry, attending Dr. Nori Riis - continue albuterol nebulizer q2h PRN - start duoneb q6h -STOP Rocephin 1g IV qd - continue doxycycline 100 mg PO q12h - start prednisone 40 mg PO qd x 5 days (9/1-9/5) - continue home rivaroxaban 20 mg PO  - Will consider obtaining a d-dimer - repeat ekg today - AM CBC - continuous pulse ox - wean oxygen as tolerated - ambulate with pulse ox   Pain due to fall Patient fell on August 31 due to tripping over her oxygen tubing to use the bathroom.  She did not lose consciousness, no lacerations, able to move all limbs, sensation intact.  Today she has a headache, which she attributes to her history of migraines rather than this fall.  No evidence of swelling.  All imaging returned within normal limits (see below in imaging section).  No evidence of bleeding.  Patient given Tylenol for pain. - tylenol prn - k-pad  Atrial Fibrillation: Pt was diagnosed in 2015 y. She is rate controlled and on long term anticoagulation. EKG reveals sinus tachycardia likely secondary to albuterol. Home meds include: Diltiazem 120mg  QD,  Lopressor 25mg  BID, Flecainide 200mg  BID, and Xarelto 20mg  qHS.  - continue home meds - AM EKG  History of Pulmonary Embolism: History of PE in 2015.expected to be provoked to previous estrogen replacement and increased sedentary lifestyle after an injur Please see shortness of breath problem above for elucidation of PE differential. Patient is on xarelto at home. - will continue rivaroxaban 20 mg PO  Headache Patient has history of migraine headaches which she says are usually right-sided/central.  No orals or vision changes associated with these migraine she takes Excedrin migraine for which she says helps.  She requests that today as she has a headache which she thinks is likely due to her migraines rather than the fall she experienced on August 31. -Excedrin Migraine as needed  MDD/Anxiety: Patient has long history of MDD/Anxiety, both are stable. She does not currently have any SI, HI, low mood, tearfulness, or trouble sleeping.  Will continue her home medications which are effexor and hydroxyzine. - continue Effexor 150mg  QD  - continue Hydroxyzine 25mg  BID PRN  GERD: Patient has a longstanding history of GERD, but it is not active at this time we will continue her home med of Pepcid.   - continue home meds   Osteoarthritis Patient has a history of osteoarthritis.  She is currently seeing PT to improve her deconditioning and knee pain.  She uses Voltaren gel and Tylenol for pain  - tylenol prn  Dry Eyes Patient uses Restasis at home. - continue home med  FEN/GI: Heart healthy diet PPx: Home Xarelto 20 mg  Disposition: Possibly to SNF once respiratory symptoms improve, we will confer with social work  Subjective:  Patient doing well today, she remains afebrile with a wet, but non-productive cough. She is still on 2L [place] satting well. She has a headache, which she believes is her typical migraine. She requests excedrin migraine, because she uses that at home to good effect.  Her central forehead is tender to palpation from her fall on 8/31, but she does not believe this HA is due to the fall as she did not have one yesterday. Her transitional housing was paid through 9/2, so she does not have a home at the moment. SW following to assist with disposition.  Objective: Temp:  [97.8 F (36.6 C)-98.3 F (36.8 C)] 98.3 F (36.8 C) (09/02 0459) Pulse Rate:  [81-117] 83 (09/02 0914) Resp:  [16-33] 18 (09/02 0459) BP: (103-151)/(51-120) 136/90 (09/02 0914) SpO2:  [94 %-100 %] 98 % (09/02 0459) Weight:  [157.8 kg-158.8 kg] 157.8 kg (09/02 0459) Physical Exam: General: well-appearing, NAD, sitting up in bed speaking in full sentences Cardiovascular: RRR, no m/r/g  Respiratory: CTAB, air movment well-appreciated in all lung fields, no wheezing, rhonchi Abdomen: soft, NT, ND Extremities: swelling present mid shin on RLE, no ecchymosis  Laboratory: Recent Labs  Lab 02/26/19 0855 02/27/19 0444  WBC 11.6* 10.8*  HGB 11.6* 11.1*  HCT 39.5 37.4  PLT 518* 500*   Recent Labs  Lab 02/26/19 0855 02/27/19 0444  NA 141 140  K 3.8 3.8  CL 105 103  CO2 25 25  BUN 18 14  CREATININE 0.70 0.61  CALCIUM 8.9 8.7*  PROT 7.5  --   BILITOT 0.5  --   ALKPHOS 91  --   ALT 22  --   AST 20  --   GLUCOSE 118* 131*     Imaging/Diagnostic Tests: Awaiting EKG read from 9/2  Dg Shoulder Right  Result Date: 02/26/2019 CLINICAL DATA:  Right shoulder pain after fall. EXAM: RIGHT SHOULDER - 2+ VIEW COMPARISON:  Radiographs of October 24, 2010. FINDINGS: There is no evidence of fracture or dislocation. Mild degenerative changes seen involving the right acromioclavicular and glenohumeral joints. Soft tissues are unremarkable. IMPRESSION: Mild degenerative joint disease involving the right acromioclavicular and glenohumeral joints. No acute abnormality seen in the right shoulder. Electronically Signed   By: Marijo Conception M.D.   On: 02/26/2019 09:40   Ct Head Wo Contrast  Result  Date: 02/26/2019 CLINICAL DATA:  Head trauma, minor, fall with head strike, on anticoagulation. EXAM: CT HEAD WITHOUT CONTRAST TECHNIQUE: Contiguous axial images were obtained from the base of the skull through the vertex without intravenous contrast. COMPARISON:  CT head 12/31/2007 FINDINGS: Brain: No evidence of acute infarction, hemorrhage, hydrocephalus, extra-axial collection or mass lesion/mass effect. Vascular: No hyperdense vessel or unexpected calcification. Skull: No calvarial fracture or suspicious osseous lesion. No scalp swelling or hematoma. Sinuses/Orbits: Paranasal sinuses and mastoid air cells are predominantly clear. Pneumatization of the petrous apices. Included orbital structures are unremarkable. Debris within the right external auditory canal. Middle ear cavities are clear. Other: None. IMPRESSION: No acute intracranial abnormality. Electronically Signed   By: Lovena Le M.D.   On: 02/26/2019 00:25   Dg Chest Portable 1 View  Result Date: 02/26/2019 CLINICAL DATA:  Wheezing. Additional history: Chest tightness morning, fall at home last night. EXAM: PORTABLE CHEST 1 VIEW COMPARISON:  CT chest 12/26/2018, chest radiographs 12/26/2018 FINDINGS: Stable cardiomediastinal silhouette. Aortic atherosclerosis. Shallow inspiration radiograph. Mild left retrocardiac opacity may reflect atelectasis. No evidence of pleural effusion or pneumothorax. No acute bony abnormality. IMPRESSION: Shallow inspiration radiograph. Mild left retrocardiac opacity may reflect atelectasis. Pneumonia is difficult to exclude. Electronically Signed   By: Kellie Simmering   On: 02/26/2019 08:07   Dg Knee Complete 4 Views Right  Result Date: 02/26/2019 CLINICAL DATA:  Fall, pain, injury. Suprapatellar swelling. EXAM: RIGHT KNEE - COMPLETE 4+ VIEW COMPARISON:  None. FINDINGS: Mild prepatellar soft tissue swelling. No joint effusion. No acute fracture or traumatic malalignment. Moderate tricompartmental degenerative changes  are present most pronounced in the patellofemoral and medial femorotibial compartments. Enthesopathic changes are noted at the patellar insertion of the distal quadriceps tendon. Additional spurring noted along the medial femoral condyle. Remaining soft tissues are unremarkable. IMPRESSION: Mild prepatellar swelling. No effusion or acute osseous abnormality. Moderate tricompartmental degenerative changes most pronounced in the medial and patellofemoral compartments. Electronically Signed   By: Lovena Le M.D.   On: 02/26/2019 00:28   Dg Hip Unilat W Or Wo Pelvis 2-3 Views Right  Result Date: 02/26/2019 CLINICAL DATA:  Right hip pain after fall. EXAM: DG HIP (WITH OR WITHOUT PELVIS) 2-3V RIGHT COMPARISON:  None. FINDINGS: There is no evidence of hip fracture or dislocation. There is no evidence of arthropathy or other focal bone abnormality. IMPRESSION: Negative. Electronically Signed   By: Marijo Conception M.D.   On: 02/26/2019 09:41    Gladys Damme, MD 02/27/2019, 9:46 AM PGY-1, [place] Intern pager: [phone], text pages welcome

## 2019-02-27 NOTE — Consult Note (Signed)
   [facility] CM Inpatient Consult   02/27/2019  PAIGELYN SHILLINGLAW 24-Jun-1956 [id number]  Notification received from [facility] LCSW today of the patient's hospitalization in observation status currently and information regarding barriers to care regarding financial assistance for housing programs are no longer available.. Patient has had 3 ED and 3 hospitalizations in the past 6 months noted.  Chart reviewed for hospitalization reveals from MD History and Physical 02/26/2019 last evening:  [name] is a 63 y.o. female presenting with worsening SOB. PMH is significant for HTN, GERD, chronic atrial fibrillation, history of PE (2015), COPD, chronic respiratory failure on 2L O2,  asthma, MDD. Shortness of Breath  H\o COPD  Patient with new wet nonproductive cough and chest tightness in the setting of COPD and increased oxygen requirement (baseline 2L O2 only at night) and difficulty taking a deep breath. In the ED she was found to have desats on room air which is abnormal for her. Chest x-ray reveals a "mild left retrocardiac opacity may reflect atelectasis, pneumonia is difficult to exclude."    Review of inpatient TOC LCSW notes indicate a call had been received from [facility] LCSW.  Plan: Follow up with Inpatient [facility] team member to make aware that [place] Management following.   Of note, [facility] Care Management services does not replace or interfere with any services that are needed or arranged by inpatient [facility] care management team.  For additional questions or referrals please contact:  Natividad Brood, RN BSN [facility] Liaison  [phone] business mobile phone Toll free office [phone]  Fax number: [phone] [email or web address]

## 2019-02-27 NOTE — TOC Progression Note (Signed)
Transition of Care [facility]) - Progression Note    Patient Details  Name: [name] White MRN: [id number] Date of Birth: June 13, 1956  Transition of Care [facility] CM/SW Contact  Zenon Mayo, RN Phone Number: 02/27/2019, 2:53 PM  Clinical Narrative:    Patient stays in [facility] 6 suites on [address], [place], she is active with [facility] for [place], she is active with [facility] for [place], [place], [place], , she also has a concentrator with [employer] - 2liters.  She plans to go back to the [place] at discharge and continue paying.  She plans to take uber for transport.         Expected Discharge Plan and Services                                                 Social Determinants of Health (SDOH) Interventions    Readmission Risk Interventions No flowsheet data found.

## 2019-02-27 NOTE — Progress Notes (Signed)
CSW lvm with patient's [facility] CSW [name] White to discuss patient's case. Appears patient has no place to live, which will be a barrier for SNF rec given no discharge plan once rehab has been completed.   CSW awaiting call back from patient's [facility] to discuss case and identify potential venue options for patient's discharge planning.   [place], [place]

## 2019-02-28 ENCOUNTER — Other Ambulatory Visit: Payer: Self-pay | Admitting: *Deleted

## 2019-02-28 ENCOUNTER — Telehealth: Payer: Medicare HMO | Admitting: Family Medicine

## 2019-02-28 DIAGNOSIS — J441 Chronic obstructive pulmonary disease with (acute) exacerbation: Secondary | ICD-10-CM | POA: Diagnosis not present

## 2019-02-28 DIAGNOSIS — R0602 Shortness of breath: Secondary | ICD-10-CM | POA: Diagnosis not present

## 2019-02-28 DIAGNOSIS — M159 Polyosteoarthritis, unspecified: Secondary | ICD-10-CM

## 2019-02-28 DIAGNOSIS — Z609 Problem related to social environment, unspecified: Secondary | ICD-10-CM

## 2019-02-28 DIAGNOSIS — M5431 Sciatica, right side: Secondary | ICD-10-CM

## 2019-02-28 LAB — CBC
HCT: 37 % (ref 36.0–46.0)
Hemoglobin: 10.9 g/dL — ABNORMAL LOW (ref 12.0–15.0)
MCH: 26.4 pg (ref 26.0–34.0)
MCHC: 29.5 g/dL — ABNORMAL LOW (ref 30.0–36.0)
MCV: 89.6 fL (ref 80.0–100.0)
Platelets: 494 10*3/uL — ABNORMAL HIGH (ref 150–400)
RBC: 4.13 MIL/uL (ref 3.87–5.11)
RDW: 19.1 % — ABNORMAL HIGH (ref 11.5–15.5)
WBC: 9.4 10*3/uL (ref 4.0–10.5)
nRBC: 0 % (ref 0.0–0.2)

## 2019-02-28 MED ORDER — ASPIRIN-ACETAMINOPHEN-CAFFEINE 250-250-65 MG PO TABS: 1.0000 | ORAL_TABLET | Freq: Three times a day (TID) | ORAL | 0 refills | Status: DC | PRN

## 2019-02-28 MED ORDER — DOXYCYCLINE HYCLATE 100 MG PO TABS: 100.0000 mg | ORAL_TABLET | Freq: Two times a day (BID) | ORAL | 0 refills | Status: AC

## 2019-02-28 MED ORDER — PREDNISONE 20 MG PO TABS: 40.0000 mg | ORAL_TABLET | Freq: Every day | ORAL | 0 refills | Status: DC

## 2019-02-28 MED ORDER — UMECLIDINIUM BROMIDE 62.5 MCG/INH IN AEPB: 1.0000 | INHALATION_SPRAY | Freq: Every day | RESPIRATORY_TRACT | 0 refills | Status: DC

## 2019-02-28 MED FILL — predniSONE 20 MG TABS: 20 | 3 days supply | Qty: 6 | Fill #0

## 2019-02-28 MED FILL — EXCEDRIN MIGRAINE CAPLET: 250-250-65 | 10 days supply | Qty: 30 | Fill #0

## 2019-02-28 MED FILL — INCRUSE ELLIPTA 62.5 MCG IN: 62.5 | 30 days supply | Qty: 30 | Fill #0

## 2019-02-28 MED FILL — DOXYCYCLINE HYCLATE 100 MG: 100 | 3 days supply | Qty: 6 | Fill #0

## 2019-02-28 NOTE — Progress Notes (Signed)
Referred by nurse to see Mrs. Kyndahl.  Offered Geographical information systems officer and prayer.

## 2019-02-28 NOTE — Progress Notes (Signed)
Medication education and discharge information provided to patient. IV removed, patient gathered all belongings: silver bag, glasses, cane, clothes, shoes, cell phone & charger, oxygen tank and medications delivered to patient's room. Questions and concerns were addressed.Taxi voucher provided and called. Patient transported in wheelchair by nurse with oxygen 2 L [place].

## 2019-02-28 NOTE — Discharge Summary (Signed)
[facility] Discharge Summary  Patient name: [name] White Medical record number: [id number] Date of birth: 1955/09/16 Age: 63 y.o. Gender: female Date of Admission: 02/25/2019  Date of Discharge: 02/28/2019 Admitting Physician: Dickie La, MD  Primary Care Provider: [name], MD Consultants: None  Indication for Hospitalization: COPD exacerbation  Discharge Diagnoses/Problem List:  COPD Atrial fibrillation History of PE on Xarelto 20 mg daily Depression/anxiety GERD Osteoarthritis Dry eyes  Disposition: [employer], patient's temporary home  Discharge Condition: Stable, improved  Discharge Exam:  General: well-appearing, NAD, sitting up in bed speaking in full sentences Cardiovascular: RRR, no m/r/g  Respiratory: CTAB, air movment well-appreciated in all lung fields, no wheezing, rhonchi Abdomen: soft, NT, ND Extremities: swelling present mid shin on RLE, no ecchymosis  Brief Hospital Course:  Patient presented on 9/1 with shortness of breath and chest tightness along with a requirement of 2 L oxygen at all times rather than her baseline of 2 L only at night.  She also had recently tripped and fallen, but x-rays of the affected areas were negative.  CXR showed possible infiltrate, so she was given ceftriaxone and doxycycline.  BNP was within normal limits, patient did not have history of CHF, so shortness of breath was consistent with COPD exacerbation.  Since patient was adherent to her Xarelto, PE was thought to be less likely, and her shortness of breath and tachycardia were attributed to COPD and albuterol use.  She was also started on prednisone 40 mg daily.  DuoNeb and albuterol nebulizer were given as well.  Overnight, patient symptoms improved, and her CXR the following day did not show infiltrates, so Rocephin was stopped.  Doxycycline 100 mg twice daily and prednisone 40 mg daily were continued.  She was also started on Umeclidinium  inhaler.  On 9/3, patient had weaned down to room air during the day and reported that she felt much better.  She was discharged with plans to finish a 5-day course of doxycycline 100 mg twice daily and prednisone 40 mg daily through 9/5.  She was also discharged with a Umeclidinium inhaler.  Patient requested to be discharged back to [facility] with home health, which was done.  Issues for Follow Up:  1. Please ensure that patient is able to continue using a long-acting muscarinic antagonist to treat her COPD 2. Consider discontinuing patient's Xarelto since her pulmonary embolism only occurred once 5 years ago.  Significant Procedures: none  Significant Labs and Imaging:  Recent Labs  Lab 02/26/19 0855 02/27/19 0444 02/28/19 0821  WBC 11.6* 10.8* 9.4  HGB 11.6* 11.1* 10.9*  HCT 39.5 37.4 37.0  PLT 518* 500* 494*   Recent Labs  Lab 02/26/19 0855 02/27/19 0444  NA 141 140  K 3.8 3.8  CL 105 103  CO2 25 25  GLUCOSE 118* 131*  BUN 18 14  CREATININE 0.70 0.61  CALCIUM 8.9 8.7*  ALKPHOS 91  --   AST 20  --   ALT 22  --   ALBUMIN 3.2*  --     Dg Chest 2 View  Result Date: 02/27/2019 CLINICAL DATA:  Cough, shortness of breath. EXAM: CHEST - 2 VIEW COMPARISON:  Radiograph of February 26, 2019. FINDINGS: Stable cardiomegaly. No pneumothorax or pleural effusion is noted. No acute pulmonary disease is noted. Bony thorax is unremarkable. IMPRESSION: No active cardiopulmonary disease. Electronically Signed   By: Marijo Conception M.D.   On: 02/27/2019 12:36   Dg Shoulder Right  Result Date: 02/26/2019 CLINICAL DATA:  Right shoulder pain after fall. EXAM: RIGHT SHOULDER - 2+ VIEW COMPARISON:  Radiographs of October 24, 2010. FINDINGS: There is no evidence of fracture or dislocation. Mild degenerative changes seen involving the right acromioclavicular and glenohumeral joints. Soft tissues are unremarkable. IMPRESSION: Mild degenerative joint disease involving the right acromioclavicular and  glenohumeral joints. No acute abnormality seen in the right shoulder. Electronically Signed   By: Marijo Conception M.D.   On: 02/26/2019 09:40   Ct Head Wo Contrast  Result Date: 02/26/2019 CLINICAL DATA:  Head trauma, minor, fall with head strike, on anticoagulation. EXAM: CT HEAD WITHOUT CONTRAST TECHNIQUE: Contiguous axial images were obtained from the base of the skull through the vertex without intravenous contrast. COMPARISON:  CT head 12/31/2007 FINDINGS: Brain: No evidence of acute infarction, hemorrhage, hydrocephalus, extra-axial collection or mass lesion/mass effect. Vascular: No hyperdense vessel or unexpected calcification. Skull: No calvarial fracture or suspicious osseous lesion. No scalp swelling or hematoma. Sinuses/Orbits: Paranasal sinuses and mastoid air cells are predominantly clear. Pneumatization of the petrous apices. Included orbital structures are unremarkable. Debris within the right external auditory canal. Middle ear cavities are clear. Other: None. IMPRESSION: No acute intracranial abnormality. Electronically Signed   By: Lovena Le M.D.   On: 02/26/2019 00:25   Dg Chest Portable 1 View  Result Date: 02/26/2019 CLINICAL DATA:  Wheezing. Additional history: Chest tightness morning, fall at home last night. EXAM: PORTABLE CHEST 1 VIEW COMPARISON:  CT chest 12/26/2018, chest radiographs 12/26/2018 FINDINGS: Stable cardiomediastinal silhouette. Aortic atherosclerosis. Shallow inspiration radiograph. Mild left retrocardiac opacity may reflect atelectasis. No evidence of pleural effusion or pneumothorax. No acute bony abnormality. IMPRESSION: Shallow inspiration radiograph. Mild left retrocardiac opacity may reflect atelectasis. Pneumonia is difficult to exclude. Electronically Signed   By: Kellie Simmering   On: 02/26/2019 08:07   Dg Knee Complete 4 Views Right  Result Date: 02/26/2019 CLINICAL DATA:  Fall, pain, injury. Suprapatellar swelling. EXAM: RIGHT KNEE - COMPLETE 4+ VIEW  COMPARISON:  None. FINDINGS: Mild prepatellar soft tissue swelling. No joint effusion. No acute fracture or traumatic malalignment. Moderate tricompartmental degenerative changes are present most pronounced in the patellofemoral and medial femorotibial compartments. Enthesopathic changes are noted at the patellar insertion of the distal quadriceps tendon. Additional spurring noted along the medial femoral condyle. Remaining soft tissues are unremarkable. IMPRESSION: Mild prepatellar swelling. No effusion or acute osseous abnormality. Moderate tricompartmental degenerative changes most pronounced in the medial and patellofemoral compartments. Electronically Signed   By: Lovena Le M.D.   On: 02/26/2019 00:28   Dg Hip Unilat W Or Wo Pelvis 2-3 Views Right  Result Date: 02/26/2019 CLINICAL DATA:  Right hip pain after fall. EXAM: DG HIP (WITH OR WITHOUT PELVIS) 2-3V RIGHT COMPARISON:  None. FINDINGS: There is no evidence of hip fracture or dislocation. There is no evidence of arthropathy or other focal bone abnormality. IMPRESSION: Negative. Electronically Signed   By: Marijo Conception M.D.   On: 02/26/2019 09:41     Results/Tests Pending at Time of Discharge:   Discharge Medications:  Allergies as of 02/28/2019      Reactions   Caffeine Other (See Comments)   Migraine   Hydralazine Hcl    Other reaction(s): Other (See Comments) AKI leading to rhabdo and electrolyte abnormalities   Hydrocodone Nausea And Vomiting   Headache   Ciprofloxacin Hives, Rash   Erythromycin Hives, Rash   Lisinopril Cough   Oxycodone Nausea And Vomiting   Headache  Sulfamethoxazole-trimethoprim Rash      Medication List    TAKE these medications   acetaminophen 325 MG tablet Commonly known as: TYLENOL Take 2 tablets (650 mg total) by mouth every 6 (six) hours as needed for mild pain.   albuterol (2.5 MG/3ML) 0.083% nebulizer solution Commonly known as: PROVENTIL Take 3 mLs (2.5 mg total) by nebulization every  6 (six) hours as needed for wheezing or shortness of breath. What changed: Another medication with the same name was removed. Continue taking this medication, and follow the directions you see here.   aspirin-acetaminophen-caffeine 250-250-65 MG tablet Commonly known as: EXCEDRIN MIGRAINE Take 1 tablet by mouth every 8 (eight) hours as needed for headache.   azelastine 0.05 % ophthalmic solution Commonly known as: OPTIVAR Place 1 drop into both eyes 2 (two) times daily as needed (itchy eyes).   cyclobenzaprine 10 MG tablet Commonly known as: FLEXERIL TAKE 1 TABLET BY MOUTH EVERY 8 HOURS AS NEEDED FOR PAIN What changed:   reasons to take this  additional instructions   cycloSPORINE 0.05 % ophthalmic emulsion Commonly known as: RESTASIS Place 1 drop into both eyes 2 (two) times daily.   diclofenac sodium 1 % Gel Commonly known as: VOLTAREN APPLY 2 GRAMS EXTERNALLY TO THE AFFECTED AREA FOUR TIMES DAILY What changed: See the new instructions.   diltiazem 120 MG 24 hr capsule Commonly known as: CARDIZEM CD Take 1 capsule (120 mg total) by mouth daily. Please keep upcoming appt in January before anymore refills. Final Attempt What changed: when to take this   doxycycline 100 MG tablet Commonly known as: VIBRA-TABS Take 1 tablet (100 mg total) by mouth every 12 (twelve) hours for 3 days.   famotidine 20 MG tablet Commonly known as: PEPCID Take 1 tablet (20 mg total) by mouth daily.   flecainide 100 MG tablet Commonly known as: TAMBOCOR TAKE 1 TABLET BY MOUTH 2 TIMES A DAY. PLEASE KEEP UPCOMING APPOINTMENT IN JANUARY BEFORE ANYMORE REFILLS. What changed: See the new instructions.   fluticasone 50 MCG/ACT nasal spray Commonly known as: FLONASE Place 2 sprays into both nostrils every morning.   hydrocortisone 25 MG suppository Commonly known as: ANUSOL-HC Place 1 suppository (25 mg total) rectally 2 (two) times daily. What changed:   when to take this  reasons to take  this   hydrOXYzine 25 MG capsule Commonly known as: VISTARIL Take 25 mg by mouth 2 (two) times daily as needed for anxiety.   loratadine 10 MG tablet Commonly known as: CLARITIN Take 1 tablet (10 mg total) by mouth daily.   MAGnesium-Oxide 400 (241.3 Mg) MG tablet Generic drug: magnesium oxide TAKE 1 TABLET BY MOUTH TWICE DAILY   metoprolol tartrate 25 MG tablet Commonly known as: LOPRESSOR TAKE 1 TABLET BY MOUTH TWICE DAILY   OXYGEN 2L at bedtime and prn exertion   pantoprazole 40 MG tablet Commonly known as: PROTONIX TAKE 1 TABLET BY MOUTH TWICE DAILY   Potassium Chloride ER 20 MEQ Tbcr TAKE 2 TABLETS BY MOUTH TWICE DAILY   predniSONE 20 MG tablet Commonly known as: DELTASONE Take 2 tablets (40 mg total) by mouth daily with breakfast. Start taking on: March 01, 2019 What changed: when to take this   umeclidinium bromide 62.5 MCG/INH Aepb Commonly known as: INCRUSE ELLIPTA Inhale 1 puff into the lungs daily. Start taking on: March 01, 2019   venlafaxine XR 150 MG 24 hr capsule Commonly known as: EFFEXOR-XR Take 150 mg by mouth daily with breakfast.   Xarelto  20 MG Tabs tablet Generic drug: rivaroxaban TAKE 1 TABLET(20 MG) BY MOUTH DAILY AFTER SUPPER What changed: See the new instructions.       Discharge Instructions: Please refer to Patient Instructions section of EMR for full details.  Patient was counseled important signs and symptoms that should prompt return to medical care, changes in medications, dietary instructions, activity restrictions, and follow up appointments.   Follow-Up Appointments: Follow-up Information    [name], MD. Go on 03/06/2019.   Specialty: Family Medicine Why: appt at 1:45 PM Contact information: [id number] [place] [address] [phone]           Gladys Damme, MD 02/28/2019, 2:43 PM PGY-1, [place]

## 2019-02-28 NOTE — Progress Notes (Signed)
Physical Therapy Treatment Patient Details Name: [name] White MRN: [id number] DOB: 03-10-1956 Today's Date: 02/28/2019    History of Present Illness Pt is a 63 y/o female admitted following fall and increased SOB likely from COPD exacerbation. PMH includes asthma, COPD, HTN, a fib, and PE.     PT Comments    Patient getting ready to go home. She reports she is doing and feeling much better. Walking to the bathroom with O2 and RW. Patient will benefit from continued skilled PT acutely. She will also benefit from HHPT once she returns home to ensure safety.       Follow Up Recommendations  Home health PT     Equipment Recommendations  Rolling walker with 5" wheels    Recommendations for Other Services       Precautions / Restrictions Precautions Precautions: Fall Precaution Comments: fell on O2 line at home Restrictions Weight Bearing Restrictions: No    Mobility  Bed Mobility               General bed mobility comments: in recliner  Transfers Overall transfer level: Needs assistance Equipment used: Rolling walker (2 wheeled) Transfers: Sit to/from Stand Sit to Stand: Min guard         General transfer comment: min guard  Ambulation/Gait Ambulation/Gait assistance: Min guard Gait Distance (Feet): 25 Feet Assistive device: Rolling walker (2 wheeled) Gait Pattern/deviations: Step-through pattern;Wide base of support;Trunk flexed Gait velocity: reduced       Stairs             Wheelchair Mobility    Modified Rankin (Stroke Patients Only)       Balance Overall balance assessment: Needs assistance Sitting-balance support: Feet supported Sitting balance-Leahy Scale: Good     Standing balance support: Bilateral upper extremity supported;During functional activity Standing balance-Leahy Scale: Good                              Cognition Arousal/Alertness: Awake/alert Behavior During Therapy: WFL for tasks  assessed/performed Overall Cognitive Status: Within Functional Limits for tasks assessed                                        Exercises Other Exercises Other Exercises: educated patient about safety, energy conservation when returning home.    General Comments        Pertinent Vitals/Pain Pain Assessment: No/denies pain Faces Pain Scale: Hurts a little bit Pain Location: R knee and thigh Pain Descriptors / Indicators: Grimacing Pain Intervention(s): Monitored during session    Home Living                      Prior Function            PT Goals (current goals can now be found in the care plan section) Acute Rehab PT Goals Patient Stated Goal: to get stronger  PT Goal Formulation: With patient Time For Goal Achievement: 03/12/19 Potential to Achieve Goals: Good Progress towards PT goals: Progressing toward goals    Frequency    Min 3X/week      PT Plan Current plan remains appropriate    Co-evaluation              AM-PAC PT "6 Clicks" Mobility   Outcome Measure  Help needed turning from your back to your side while in  a flat bed without using bedrails?: A Little Help needed moving from lying on your back to sitting on the side of a flat bed without using bedrails?: A Little Help needed moving to and from a bed to a chair (including a wheelchair)?: A Little Help needed standing up from a chair using your arms (e.g., wheelchair or bedside chair)?: A Little Help needed to walk in hospital room?: A Little Help needed climbing 3-5 steps with a railing? : A Lot 6 Click Score: 17    End of Session Equipment Utilized During Treatment: Oxygen Activity Tolerance: Patient tolerated treatment well Patient left: in chair   PT Visit Diagnosis: Muscle weakness (generalized) (M62.81);Difficulty in walking, not elsewhere classified (R26.2);History of falling (Z91.81);Pain Pain - Right/Left: Right Pain - part of body: Leg     Time:  [id number] PT Time Calculation (min) (ACUTE ONLY): 8 min  Charges:  $Therapeutic Activity: 8-22 mins                     Ebony Yorio, PT, GCS 02/28/19,2:56 PM

## 2019-02-28 NOTE — Patient Outreach (Signed)
[facility]) Care Management  02/28/2019  [name] White 1956-05-15 [id number]   Patient continues to reside in the hospital, under observation status, for evaluation and treatment of worsening shortness of breath, wet, nonproductive cough and chest tightness.  Patient was found to have mild left retrocardiac opacity, after a chest x-ray was performed.  Patient is now requiring 2 liters of continuous oxygen via nasal cannula.   According to patient's Inpatient Hospital Social Worker, [place], patient will be returning to live at [facility] at time of discharge, with [place] arranged through [place].  These home health services will include nursing, physical therapy and occupational therapy.  CSW recommended that patient also receive an home health aide.  CSW will continue to follow patient while hospitalized, then resume community case management services upon discharge.  CSW will make arrangements to follow-up with patient again next week, on Thursday, March 07, 2019, around 10:00AM.  Patient has CSW's contact information and will contact CSW if services are needed in the meantime.  [name], BSW, MSW, LCSW  Licensed Education officer, [facility]  Mailing [place] [address], [place], [place] [address] Physical Address-[address], [place], [place] [address] Toll Free Main # [phone] Fax # [phone] Cell # [phone]  Office # [phone] [name]

## 2019-02-28 NOTE — Discharge Instructions (Signed)
It was a pleasure taking care of you in the hospital! While you were here, you were treated for a COPD exacerbation. Please finish taking the steroids (prednisone 40 mg) and doxycycline, which both end on Friday, September 5.   1. We started a new medication, Incruse Ellipta, while you were in the hospital. Please continue to use this inhaler once a day. We recommend you see your pulmonologist after an exacerbation.   2. You may continue to use your oxygen at home at night per your usual routine.  3. Please keep your hospital f/u appointment on 9/9 at 1:45 PM with your PCP at the [facility].  4. We have requested an increase in PT/OT that you receive at home as well as a home health aide.   Chronic Obstructive Pulmonary Disease Exacerbation Chronic obstructive pulmonary disease (COPD) is a long-term (chronic) lung problem. In COPD, the flow of air from the lungs is limited. COPD exacerbations are times that breathing gets worse and you need more than your normal treatment. Without treatment, they can be life threatening. If they happen often, your lungs can become more damaged. If your COPD gets worse, your doctor may treat you with:  Medicines.  Oxygen.  Different ways to clear your airway, such as using a mask. Follow these instructions at home: Medicines  Take over-the-counter and prescription medicines only as told by your doctor.  If you take an antibiotic or steroid medicine, do not stop taking the medicine even if you start to feel better.  Keep up with shots (vaccinations) as told by your doctor. Be sure to get a yearly (annual) flu shot. Lifestyle  Do not smoke. If you need help quitting, ask your doctor.  Eat healthy foods.  Exercise regularly.  Get plenty of sleep.  Avoid tobacco smoke and other things that can bother your lungs.  Wash your hands often with soap and water. This will help keep you from getting an infection. If you cannot use soap and  water, use hand sanitizer.  During flu season, avoid areas that are crowded with people. General instructions  Drink enough fluid to keep your pee (urine) clear or pale yellow. Do not do this if your doctor has told you not to.  Use a cool mist machine (vaporizer).  If you use oxygen or a machine that turns medicine into a mist (nebulizer), continue to use it as told.  Follow all instructions for rehabilitation. These are steps you can take to make your body work better.  Keep all follow-up visits as told by your doctor. This is important. Contact a doctor if:  Your COPD symptoms get worse than normal. Get help right away if:  You are short of breath and it gets worse.  You have trouble talking.  You have chest pain.  You cough up blood.  You have a fever.  You keep throwing up (vomiting).  You feel weak or you pass out (faint).  You feel confused.  You are not able to sleep because of your symptoms.  You are not able to do daily activities. Summary  COPD exacerbations are times that breathing gets worse and you need more treatment than normal.  COPD exacerbations can be very serious and may cause your lungs to become more damaged.  Do not smoke. If you need help quitting, ask your doctor.  Stay up-to-date on your shots. Get a flu shot every year. This information is not intended to replace advice given to you by  your health care provider. Make sure you discuss any questions you have with your health care provider. Document Released: 06/02/2011 Document Revised: 05/26/2017 Document Reviewed: 07/18/2016 Elsevier Patient Education  2020 [employer].

## 2019-02-28 NOTE — Progress Notes (Signed)
Family Medicine Teaching Service Daily Progress Note Intern Pager: [phone]  Patient name: [name] White Medical record number: [id number] Date of birth: 03-06-1956 Age: 63 y.o. Gender: female  Primary Care Provider: [name], MD Consultants: None Code Status: Full  Pt Overview and Major Events to Date:  9/1- admitted  Assessment and Plan: [name] White is a 63 y.o. female admitted for COPD exacerbation. She has improved and is saturating well on room air. Plan for discharge to home today with home health, PT/OT.  PMH is significant for HTN, GERD, chronic atrial fibrillation, history of PE (2015), COPD, chronic respiratory failure on 2L O2,  asthma, MDD.  Shortness of Breath  H\o COPD  Patient with new wet nonproductive cough and chest tightness in the setting of COPD and increased oxygen requirement (baseline 2L O2 only at night) and difficulty taking a deep breath.  O2 stopped yesterday as she was able to ambulate a few steps while maintaining SpO2 90% and greater. She remains off O2. She reports still having trouble taking a deep breath, but cough is improving. Repeat chest x-ray yesterday reveals no acute pulmonary disease.  Repeat EKG revealed normal sinus rhythm and no other problems. Her home meds include an albuterol nebulizer and a Ventolin inhaler as needed. - admit to FPTS, med-surg telemetry, attending Dr. Nori Riis - continue albuterol nebulizer q2h PRN - started incruse ellipta inhaler yesterday - continue doxycycline 100 mg PO q12h (9/1-9/5) - start prednisone 40 mg PO qd x 5 days (9/1-9/5) - continue home rivaroxaban 20 mg PO  - wean oxygen as tolerated - ambulate with pulse ox   Pain due to fall Patient fell on August 31 due to tripping over her oxygen tubing to use the bathroom.  She did not lose consciousness, no lacerations, able to move all limbs, sensation intact. She is feeling better, but a little sore on her R leg still. No evidence of swelling.  All  imaging returned within normal limits (see below in imaging section).  No evidence of bleeding.  Patient given Tylenol for pain. - tylenol prn - k-pad  Atrial Fibrillation: Pt was diagnosed in 2015. She is rate controlled and on long term anticoagulation. EKG reveals NSR yesterday. Home meds include: Diltiazem 120mg  QD, Lopressor 25mg  BID, Flecainide 200mg  BID, and Xarelto 20mg  qHS.  - continue home meds - AM EKG  History of Pulmonary Embolism: History of PE in 2015.expected to be provoked to previous estrogen replacement and increased sedentary lifestyle after an injur Please see shortness of breath problem above for elucidation of PE differential. Patient is on xarelto at home. - will continue rivaroxaban 20 mg PO  Headache Patient has history of migraine headaches which she says are usually right-sided/central.  No orals or vision changes associated with these migraine she takes Excedrin migraine for which she says helps.  She requests that today as she has a headache which she thinks is likely due to her migraines rather than the fall she experienced on August 31. -Excedrin Migraine as needed  MDD/Anxiety: Patient has long history of MDD/Anxiety, both are stable. She does not currently have any SI, HI, low mood, tearfulness, or trouble sleeping.  Will continue her home medications which are effexor and hydroxyzine. - continue Effexor 150mg  QD  - continue Hydroxyzine 25mg  BID PRN  GERD: Patient has a longstanding history of GERD, but it is not active at this time we will continue her home med of Pepcid.   - continue home meds  Osteoarthritis Patient has a history of osteoarthritis.  She is currently seeing PT to improve her deconditioning and knee pain.  She uses Voltaren gel and Tylenol for pain  - tylenol prn  Dry Eyes Patient uses Restasis at home. - continue home med  FEN/GI: Heart healthy diet PPx: Home Xarelto 20 mg  Disposition: To home today with close follow up  by community social work, home PT/OT, home health  Subjective:  Patient doing well today, able to get to OOB to chair and remains off O2 satting well. She wishes to go home rather than to a SNF as she has home PT/OT and she is afraid of getting COVID-19 in a SNF. SW following to assist with disposition.   Objective: Temp:  [97.8 F (36.6 C)-98.6 F (37 C)] 98.6 F (37 C) (09/03 0819) Pulse Rate:  [71-88] 75 (09/03 0819) Resp:  [18-20] 20 (09/03 0819) BP: (110-136)/(58-90) 127/68 (09/03 0819) SpO2:  [90 %-100 %] 94 % (09/03 0819) Weight:  [163.3 kg] (P) 163.3 kg (09/03 0601) Physical Exam: General: well-appearing, NAD, sitting up in bed speaking in full sentences Cardiovascular: RRR, no m/r/g  Respiratory: CTAB, air movment well-appreciated in all lung fields, no wheezing, rhonchi Abdomen: soft, NT, ND Extremities: swelling present mid shin on RLE, no ecchymosis  Laboratory: Recent Labs  Lab 02/26/19 0855 02/27/19 0444 02/28/19 0821  WBC 11.6* 10.8* 9.4  HGB 11.6* 11.1* 10.9*  HCT 39.5 37.4 37.0  PLT 518* 500* 494*   Recent Labs  Lab 02/26/19 0855 02/27/19 0444  NA 141 140  K 3.8 3.8  CL 105 103  CO2 25 25  BUN 18 14  CREATININE 0.70 0.61  CALCIUM 8.9 8.7*  PROT 7.5  --   BILITOT 0.5  --   ALKPHOS 91  --   ALT 22  --   AST 20  --   GLUCOSE 118* 131*     Imaging/Diagnostic Tests: EKG 9/2: NSR  Dg Chest 2 View  Result Date: 02/27/2019 CLINICAL DATA:  Cough, shortness of breath. EXAM: CHEST - 2 VIEW COMPARISON:  Radiograph of February 26, 2019. FINDINGS: Stable cardiomegaly. No pneumothorax or pleural effusion is noted. No acute pulmonary disease is noted. Bony thorax is unremarkable. IMPRESSION: No active cardiopulmonary disease. Electronically Signed   By: Marijo Conception M.D.   On: 02/27/2019 12:36   Dg Shoulder Right  Result Date: 02/26/2019 CLINICAL DATA:  Right shoulder pain after fall. EXAM: RIGHT SHOULDER - 2+ VIEW COMPARISON:  Radiographs of October 24, 2010. FINDINGS: There is no evidence of fracture or dislocation. Mild degenerative changes seen involving the right acromioclavicular and glenohumeral joints. Soft tissues are unremarkable. IMPRESSION: Mild degenerative joint disease involving the right acromioclavicular and glenohumeral joints. No acute abnormality seen in the right shoulder. Electronically Signed   By: Marijo Conception M.D.   On: 02/26/2019 09:40   Ct Head Wo Contrast  Result Date: 02/26/2019 CLINICAL DATA:  Head trauma, minor, fall with head strike, on anticoagulation. EXAM: CT HEAD WITHOUT CONTRAST TECHNIQUE: Contiguous axial images were obtained from the base of the skull through the vertex without intravenous contrast. COMPARISON:  CT head 12/31/2007 FINDINGS: Brain: No evidence of acute infarction, hemorrhage, hydrocephalus, extra-axial collection or mass lesion/mass effect. Vascular: No hyperdense vessel or unexpected calcification. Skull: No calvarial fracture or suspicious osseous lesion. No scalp swelling or hematoma. Sinuses/Orbits: Paranasal sinuses and mastoid air cells are predominantly clear. Pneumatization of the petrous apices. Included orbital structures are unremarkable. Debris within the  right external auditory canal. Middle ear cavities are clear. Other: None. IMPRESSION: No acute intracranial abnormality. Electronically Signed   By: Lovena Le M.D.   On: 02/26/2019 00:25   Dg Chest Portable 1 View  Result Date: 02/26/2019 CLINICAL DATA:  Wheezing. Additional history: Chest tightness morning, fall at home last night. EXAM: PORTABLE CHEST 1 VIEW COMPARISON:  CT chest 12/26/2018, chest radiographs 12/26/2018 FINDINGS: Stable cardiomediastinal silhouette. Aortic atherosclerosis. Shallow inspiration radiograph. Mild left retrocardiac opacity may reflect atelectasis. No evidence of pleural effusion or pneumothorax. No acute bony abnormality. IMPRESSION: Shallow inspiration radiograph. Mild left retrocardiac opacity may  reflect atelectasis. Pneumonia is difficult to exclude. Electronically Signed   By: Kellie Simmering   On: 02/26/2019 08:07   Dg Knee Complete 4 Views Right  Result Date: 02/26/2019 CLINICAL DATA:  Fall, pain, injury. Suprapatellar swelling. EXAM: RIGHT KNEE - COMPLETE 4+ VIEW COMPARISON:  None. FINDINGS: Mild prepatellar soft tissue swelling. No joint effusion. No acute fracture or traumatic malalignment. Moderate tricompartmental degenerative changes are present most pronounced in the patellofemoral and medial femorotibial compartments. Enthesopathic changes are noted at the patellar insertion of the distal quadriceps tendon. Additional spurring noted along the medial femoral condyle. Remaining soft tissues are unremarkable. IMPRESSION: Mild prepatellar swelling. No effusion or acute osseous abnormality. Moderate tricompartmental degenerative changes most pronounced in the medial and patellofemoral compartments. Electronically Signed   By: Lovena Le M.D.   On: 02/26/2019 00:28   Dg Hip Unilat W Or Wo Pelvis 2-3 Views Right  Result Date: 02/26/2019 CLINICAL DATA:  Right hip pain after fall. EXAM: DG HIP (WITH OR WITHOUT PELVIS) 2-3V RIGHT COMPARISON:  None. FINDINGS: There is no evidence of hip fracture or dislocation. There is no evidence of arthropathy or other focal bone abnormality. IMPRESSION: Negative. Electronically Signed   By: Marijo Conception M.D.   On: 02/26/2019 09:41    Gladys Damme, MD 02/28/2019, 8:58 AM PGY-1, [place] Intern pager: [phone], text pages welcome

## 2019-02-28 NOTE — TOC Transition Note (Signed)
Transition of Care [facility]) - CM/SW Discharge Note   Patient Details  Name: [name] White MRN: [id number] Date of Birth: 10/25/1955  Transition of Care [facility]) CM/SW Contact:  Zenon Mayo, RN Phone Number: 02/28/2019, 2:40 PM   Clinical Narrative:    Patient stays in [facility] 6 suites on [address], [place], she is active with [facility] for [place], she is active with [facility] for [place], [place], [place], , she also has a concentrator with [employer] - 2liters.  She plans to go back to the [place] [facility] at discharge and continue paying, she will need cab voucher for discharge, NCM gave voucher to RN.  Also Edwyna Ready will bring oxygen tank up to patient 's room prior to discharge.  She would like to have an aide added to [facility] services. NCM informed Mateo Flow of this information.  She will check to see if MD needs to put order in and let NCM know.  Patient is under observation so resumption orders not needed, but if adding an aide she may need to add that order.   Final next level of care: [place] Barriers to Discharge: No Barriers Identified   Patient Goals and CMS Choice Patient states their goals for this hospitalization and ongoing recovery are:: get better CMS Medicare.gov Compare Post Acute Care list provided to:: Patient Choice offered to / list presented to : Patient  Discharge Placement                       Discharge Plan and Services                DME Arranged: (NA)         HH Arranged: RN, PT, OT, Nurse's Aide, Disease Management [place] Agency: [place] ([facility]) Date HH Agency Contacted: 02/28/19 Time [place]: 1440 Representative spoke with at [place]: [place] ([place]) Interventions     Readmission Risk Interventions No flowsheet data found.

## 2019-03-03 NOTE — Progress Notes (Signed)
Patient was still in the hospital at time of appointment.  We will no charge for patient.

## 2019-03-05 ENCOUNTER — Inpatient Hospital Stay (HOSPITAL_COMMUNITY)
Admission: EM | Admit: 2019-03-05 | Discharge: 2019-03-08 | DRG: 378 | Disposition: A | Discharge status: Home Health | Payer: Medicare HMO | Attending: Family Medicine | Admitting: Family Medicine

## 2019-03-05 ENCOUNTER — Other Ambulatory Visit: Payer: Self-pay

## 2019-03-05 ENCOUNTER — Other Ambulatory Visit: Payer: Self-pay | Admitting: Family Medicine

## 2019-03-05 ENCOUNTER — Encounter (HOSPITAL_COMMUNITY): Payer: Self-pay | Admitting: Emergency Medicine

## 2019-03-05 DIAGNOSIS — Z8249 Family history of ischemic heart disease and other diseases of the circulatory system: Secondary | ICD-10-CM

## 2019-03-05 DIAGNOSIS — N3281 Overactive bladder: Secondary | ICD-10-CM | POA: Diagnosis present

## 2019-03-05 DIAGNOSIS — D649 Anemia, unspecified: Secondary | ICD-10-CM | POA: Diagnosis present

## 2019-03-05 DIAGNOSIS — Z7952 Long term (current) use of systemic steroids: Secondary | ICD-10-CM

## 2019-03-05 DIAGNOSIS — K5731 Diverticulosis of large intestine without perforation or abscess with bleeding: Secondary | ICD-10-CM | POA: Diagnosis present

## 2019-03-05 DIAGNOSIS — M170 Bilateral primary osteoarthritis of knee: Secondary | ICD-10-CM | POA: Diagnosis present

## 2019-03-05 DIAGNOSIS — E8809 Other disorders of plasma-protein metabolism, not elsewhere classified: Secondary | ICD-10-CM | POA: Diagnosis present

## 2019-03-05 DIAGNOSIS — M545 Low back pain: Secondary | ICD-10-CM | POA: Diagnosis present

## 2019-03-05 DIAGNOSIS — Z789 Other specified health status: Secondary | ICD-10-CM | POA: Diagnosis not present

## 2019-03-05 DIAGNOSIS — F329 Major depressive disorder, single episode, unspecified: Secondary | ICD-10-CM | POA: Diagnosis present

## 2019-03-05 DIAGNOSIS — G2581 Restless legs syndrome: Secondary | ICD-10-CM | POA: Diagnosis present

## 2019-03-05 DIAGNOSIS — E0590 Thyrotoxicosis, unspecified without thyrotoxic crisis or storm: Secondary | ICD-10-CM | POA: Diagnosis present

## 2019-03-05 DIAGNOSIS — F419 Anxiety disorder, unspecified: Secondary | ICD-10-CM | POA: Diagnosis present

## 2019-03-05 DIAGNOSIS — J9611 Chronic respiratory failure with hypoxia: Secondary | ICD-10-CM | POA: Diagnosis present

## 2019-03-05 DIAGNOSIS — J449 Chronic obstructive pulmonary disease, unspecified: Secondary | ICD-10-CM | POA: Diagnosis present

## 2019-03-05 DIAGNOSIS — Z9981 Dependence on supplemental oxygen: Secondary | ICD-10-CM | POA: Diagnosis not present

## 2019-03-05 DIAGNOSIS — R109 Unspecified abdominal pain: Secondary | ICD-10-CM | POA: Diagnosis present

## 2019-03-05 DIAGNOSIS — K219 Gastro-esophageal reflux disease without esophagitis: Secondary | ICD-10-CM | POA: Diagnosis present

## 2019-03-05 DIAGNOSIS — S3991XA Unspecified injury of abdomen, initial encounter: Secondary | ICD-10-CM | POA: Diagnosis not present

## 2019-03-05 DIAGNOSIS — I10 Essential (primary) hypertension: Secondary | ICD-10-CM | POA: Diagnosis present

## 2019-03-05 DIAGNOSIS — I482 Chronic atrial fibrillation, unspecified: Secondary | ICD-10-CM | POA: Diagnosis present

## 2019-03-05 DIAGNOSIS — S301XXA Contusion of abdominal wall, initial encounter: Secondary | ICD-10-CM | POA: Diagnosis present

## 2019-03-05 DIAGNOSIS — E662 Morbid (severe) obesity with alveolar hypoventilation: Secondary | ICD-10-CM | POA: Diagnosis present

## 2019-03-05 DIAGNOSIS — N3941 Urge incontinence: Secondary | ICD-10-CM | POA: Diagnosis present

## 2019-03-05 DIAGNOSIS — Z20828 Contact with and (suspected) exposure to other viral communicable diseases: Secondary | ICD-10-CM | POA: Diagnosis present

## 2019-03-05 DIAGNOSIS — G8929 Other chronic pain: Secondary | ICD-10-CM | POA: Diagnosis present

## 2019-03-05 DIAGNOSIS — R000 Tachycardia, unspecified: Secondary | ICD-10-CM | POA: Diagnosis present

## 2019-03-05 DIAGNOSIS — K625 Hemorrhage of anus and rectum: Secondary | ICD-10-CM | POA: Diagnosis present

## 2019-03-05 DIAGNOSIS — I480 Paroxysmal atrial fibrillation: Secondary | ICD-10-CM | POA: Diagnosis present

## 2019-03-05 DIAGNOSIS — R52 Pain, unspecified: Secondary | ICD-10-CM | POA: Diagnosis not present

## 2019-03-05 DIAGNOSIS — Z79899 Other long term (current) drug therapy: Secondary | ICD-10-CM

## 2019-03-05 DIAGNOSIS — R6889 Other general symptoms and signs: Secondary | ICD-10-CM | POA: Diagnosis present

## 2019-03-05 DIAGNOSIS — Z90710 Acquired absence of both cervix and uterus: Secondary | ICD-10-CM

## 2019-03-05 DIAGNOSIS — D6869 Other thrombophilia: Secondary | ICD-10-CM

## 2019-03-05 DIAGNOSIS — W010XXA Fall on same level from slipping, tripping and stumbling without subsequent striking against object, initial encounter: Secondary | ICD-10-CM | POA: Diagnosis present

## 2019-03-05 DIAGNOSIS — D72829 Elevated white blood cell count, unspecified: Secondary | ICD-10-CM | POA: Diagnosis present

## 2019-03-05 DIAGNOSIS — G4733 Obstructive sleep apnea (adult) (pediatric): Secondary | ICD-10-CM | POA: Diagnosis not present

## 2019-03-05 DIAGNOSIS — Z86711 Personal history of pulmonary embolism: Secondary | ICD-10-CM

## 2019-03-05 DIAGNOSIS — Z7901 Long term (current) use of anticoagulants: Secondary | ICD-10-CM | POA: Diagnosis not present

## 2019-03-05 DIAGNOSIS — Z6841 Body Mass Index (BMI) 40.0 and over, adult: Secondary | ICD-10-CM | POA: Diagnosis not present

## 2019-03-05 DIAGNOSIS — K922 Gastrointestinal hemorrhage, unspecified: Secondary | ICD-10-CM | POA: Diagnosis present

## 2019-03-05 DIAGNOSIS — Z825 Family history of asthma and other chronic lower respiratory diseases: Secondary | ICD-10-CM

## 2019-03-05 DIAGNOSIS — I959 Hypotension, unspecified: Secondary | ICD-10-CM | POA: Diagnosis present

## 2019-03-05 DIAGNOSIS — J9690 Respiratory failure, unspecified, unspecified whether with hypoxia or hypercapnia: Secondary | ICD-10-CM | POA: Diagnosis present

## 2019-03-05 DIAGNOSIS — R1084 Generalized abdominal pain: Secondary | ICD-10-CM | POA: Diagnosis not present

## 2019-03-05 DIAGNOSIS — Z7951 Long term (current) use of inhaled steroids: Secondary | ICD-10-CM

## 2019-03-05 DIAGNOSIS — G4730 Sleep apnea, unspecified: Secondary | ICD-10-CM | POA: Diagnosis present

## 2019-03-05 DIAGNOSIS — Z87891 Personal history of nicotine dependence: Secondary | ICD-10-CM

## 2019-03-05 DIAGNOSIS — R58 Hemorrhage, not elsewhere classified: Secondary | ICD-10-CM | POA: Diagnosis not present

## 2019-03-05 HISTORY — DX: Gastrointestinal hemorrhage, unspecified: K92.2

## 2019-03-05 MED ORDER — SODIUM CHLORIDE 0.9% FLUSH
3.0000 mL | Freq: Once | INTRAVENOUS | Status: AC
  Administered 2019-03-06: 3 mL via INTRAVENOUS

## 2019-03-05 NOTE — ED Triage Notes (Signed)
BIB [facility] with c/o of abdominal tenderness. Pt also reports blood in her stool and urine. Pt recently seen here for fall on blood thinners. Hx of COPD and CHF.

## 2019-03-06 ENCOUNTER — Telehealth: Payer: Medicare HMO

## 2019-03-06 ENCOUNTER — Other Ambulatory Visit: Payer: Self-pay

## 2019-03-06 ENCOUNTER — Telehealth: Payer: Self-pay

## 2019-03-06 ENCOUNTER — Inpatient Hospital Stay: Payer: Medicare HMO | Admitting: Family Medicine

## 2019-03-06 ENCOUNTER — Encounter (HOSPITAL_COMMUNITY): Payer: Self-pay | Admitting: Family Medicine

## 2019-03-06 ENCOUNTER — Other Ambulatory Visit: Payer: Self-pay | Admitting: *Deleted

## 2019-03-06 ENCOUNTER — Emergency Department (HOSPITAL_COMMUNITY): Payer: Medicare HMO

## 2019-03-06 DIAGNOSIS — Z789 Other specified health status: Secondary | ICD-10-CM | POA: Diagnosis not present

## 2019-03-06 DIAGNOSIS — D72829 Elevated white blood cell count, unspecified: Secondary | ICD-10-CM | POA: Diagnosis present

## 2019-03-06 DIAGNOSIS — Z9981 Dependence on supplemental oxygen: Secondary | ICD-10-CM | POA: Diagnosis not present

## 2019-03-06 DIAGNOSIS — Z7901 Long term (current) use of anticoagulants: Secondary | ICD-10-CM | POA: Diagnosis not present

## 2019-03-06 DIAGNOSIS — Z6841 Body Mass Index (BMI) 40.0 and over, adult: Secondary | ICD-10-CM | POA: Diagnosis not present

## 2019-03-06 DIAGNOSIS — S301XXA Contusion of abdominal wall, initial encounter: Secondary | ICD-10-CM

## 2019-03-06 DIAGNOSIS — I480 Paroxysmal atrial fibrillation: Secondary | ICD-10-CM

## 2019-03-06 DIAGNOSIS — W010XXA Fall on same level from slipping, tripping and stumbling without subsequent striking against object, initial encounter: Secondary | ICD-10-CM | POA: Diagnosis present

## 2019-03-06 DIAGNOSIS — F419 Anxiety disorder, unspecified: Secondary | ICD-10-CM | POA: Diagnosis present

## 2019-03-06 DIAGNOSIS — G4733 Obstructive sleep apnea (adult) (pediatric): Secondary | ICD-10-CM

## 2019-03-06 DIAGNOSIS — R000 Tachycardia, unspecified: Secondary | ICD-10-CM | POA: Diagnosis present

## 2019-03-06 DIAGNOSIS — J449 Chronic obstructive pulmonary disease, unspecified: Secondary | ICD-10-CM | POA: Diagnosis present

## 2019-03-06 DIAGNOSIS — K219 Gastro-esophageal reflux disease without esophagitis: Secondary | ICD-10-CM | POA: Diagnosis present

## 2019-03-06 DIAGNOSIS — J9611 Chronic respiratory failure with hypoxia: Secondary | ICD-10-CM | POA: Diagnosis present

## 2019-03-06 DIAGNOSIS — Z20828 Contact with and (suspected) exposure to other viral communicable diseases: Secondary | ICD-10-CM | POA: Diagnosis present

## 2019-03-06 DIAGNOSIS — G2581 Restless legs syndrome: Secondary | ICD-10-CM | POA: Diagnosis present

## 2019-03-06 DIAGNOSIS — K922 Gastrointestinal hemorrhage, unspecified: Secondary | ICD-10-CM

## 2019-03-06 DIAGNOSIS — I10 Essential (primary) hypertension: Secondary | ICD-10-CM | POA: Diagnosis not present

## 2019-03-06 DIAGNOSIS — N3281 Overactive bladder: Secondary | ICD-10-CM | POA: Diagnosis present

## 2019-03-06 DIAGNOSIS — K625 Hemorrhage of anus and rectum: Secondary | ICD-10-CM | POA: Diagnosis not present

## 2019-03-06 DIAGNOSIS — I482 Chronic atrial fibrillation, unspecified: Secondary | ICD-10-CM | POA: Diagnosis present

## 2019-03-06 DIAGNOSIS — D649 Anemia, unspecified: Secondary | ICD-10-CM | POA: Diagnosis present

## 2019-03-06 DIAGNOSIS — E662 Morbid (severe) obesity with alveolar hypoventilation: Secondary | ICD-10-CM | POA: Diagnosis present

## 2019-03-06 DIAGNOSIS — D6869 Other thrombophilia: Secondary | ICD-10-CM | POA: Diagnosis not present

## 2019-03-06 DIAGNOSIS — F329 Major depressive disorder, single episode, unspecified: Secondary | ICD-10-CM | POA: Diagnosis present

## 2019-03-06 DIAGNOSIS — I959 Hypotension, unspecified: Secondary | ICD-10-CM | POA: Diagnosis not present

## 2019-03-06 DIAGNOSIS — N3941 Urge incontinence: Secondary | ICD-10-CM | POA: Diagnosis present

## 2019-03-06 DIAGNOSIS — I5030 Unspecified diastolic (congestive) heart failure: Secondary | ICD-10-CM | POA: Insufficient documentation

## 2019-03-06 DIAGNOSIS — R109 Unspecified abdominal pain: Secondary | ICD-10-CM | POA: Diagnosis present

## 2019-03-06 DIAGNOSIS — S3991XA Unspecified injury of abdomen, initial encounter: Secondary | ICD-10-CM | POA: Diagnosis not present

## 2019-03-06 DIAGNOSIS — E8809 Other disorders of plasma-protein metabolism, not elsewhere classified: Secondary | ICD-10-CM | POA: Diagnosis present

## 2019-03-06 DIAGNOSIS — E0590 Thyrotoxicosis, unspecified without thyrotoxic crisis or storm: Secondary | ICD-10-CM | POA: Diagnosis present

## 2019-03-06 DIAGNOSIS — K5731 Diverticulosis of large intestine without perforation or abscess with bleeding: Secondary | ICD-10-CM | POA: Diagnosis present

## 2019-03-06 HISTORY — DX: Contusion of abdominal wall, initial encounter: S30.1XXA

## 2019-03-06 HISTORY — DX: Morbid (severe) obesity with alveolar hypoventilation: E66.2

## 2019-03-06 LAB — CBC WITH DIFFERENTIAL/PLATELET
Abs Immature Granulocytes: 0.06 10*3/uL (ref 0.00–0.07)
Basophils Absolute: 0.1 10*3/uL (ref 0.0–0.1)
Basophils Relative: 1 %
Eosinophils Absolute: 0.5 10*3/uL (ref 0.0–0.5)
Eosinophils Relative: 5 %
HCT: 34.4 % — ABNORMAL LOW (ref 36.0–46.0)
Hemoglobin: 10.1 g/dL — ABNORMAL LOW (ref 12.0–15.0)
Immature Granulocytes: 1 %
Lymphocytes Relative: 20 %
Lymphs Abs: 2.1 10*3/uL (ref 0.7–4.0)
MCH: 27.1 pg (ref 26.0–34.0)
MCHC: 29.4 g/dL — ABNORMAL LOW (ref 30.0–36.0)
MCV: 92.2 fL (ref 80.0–100.0)
Monocytes Absolute: 0.7 10*3/uL (ref 0.1–1.0)
Monocytes Relative: 6 %
Neutro Abs: 6.9 10*3/uL (ref 1.7–7.7)
Neutrophils Relative %: 67 %
Platelets: 490 10*3/uL — ABNORMAL HIGH (ref 150–400)
RBC: 3.73 MIL/uL — ABNORMAL LOW (ref 3.87–5.11)
RDW: 19.8 % — ABNORMAL HIGH (ref 11.5–15.5)
WBC: 10.3 10*3/uL (ref 4.0–10.5)
nRBC: 0.2 % (ref 0.0–0.2)

## 2019-03-06 LAB — COMPREHENSIVE METABOLIC PANEL
ALT: 25 U/L (ref 0–44)
AST: 22 U/L (ref 15–41)
Albumin: 3 g/dL — ABNORMAL LOW (ref 3.5–5.0)
Alkaline Phosphatase: 85 U/L (ref 38–126)
Anion gap: 10 (ref 5–15)
BUN: 14 mg/dL (ref 8–23)
CO2: 26 mmol/L (ref 22–32)
Calcium: 8.7 mg/dL — ABNORMAL LOW (ref 8.9–10.3)
Chloride: 100 mmol/L (ref 98–111)
Creatinine, Ser: 0.75 mg/dL (ref 0.44–1.00)
GFR calc Af Amer: >60 mL/min (ref >60)
GFR calc non Af Amer: >60 mL/min (ref >60)
Glucose, Bld: 134 mg/dL — ABNORMAL HIGH (ref 70–99)
Potassium: 3.6 mmol/L (ref 3.5–5.1)
Sodium: 136 mmol/L (ref 135–145)
Total Bilirubin: 0.3 mg/dL (ref 0.3–1.2)
Total Protein: 7 g/dL (ref 6.5–8.1)

## 2019-03-06 LAB — CBC
HCT: 33.8 % — ABNORMAL LOW (ref 36.0–46.0)
Hemoglobin: 10.2 g/dL — ABNORMAL LOW (ref 12.0–15.0)
MCH: 26.6 pg (ref 26.0–34.0)
MCHC: 30.2 g/dL (ref 30.0–36.0)
MCV: 88.3 fL (ref 80.0–100.0)
Platelets: 464 10*3/uL — ABNORMAL HIGH (ref 150–400)
RBC: 3.83 MIL/uL — ABNORMAL LOW (ref 3.87–5.11)
RDW: 19.2 % — ABNORMAL HIGH (ref 11.5–15.5)
WBC: 10.8 10*3/uL — ABNORMAL HIGH (ref 4.0–10.5)
nRBC: 0.3 % — ABNORMAL HIGH (ref 0.0–0.2)

## 2019-03-06 LAB — BASIC METABOLIC PANEL
Anion gap: 7 (ref 5–15)
BUN: 13 mg/dL (ref 8–23)
CO2: 30 mmol/L (ref 22–32)
Calcium: 8.6 mg/dL — ABNORMAL LOW (ref 8.9–10.3)
Chloride: 103 mmol/L (ref 98–111)
Creatinine, Ser: 0.62 mg/dL (ref 0.44–1.00)
GFR calc Af Amer: >60 mL/min (ref >60)
GFR calc non Af Amer: >60 mL/min (ref >60)
Glucose, Bld: 131 mg/dL — ABNORMAL HIGH (ref 70–99)
Potassium: 4.8 mmol/L (ref 3.5–5.1)
Sodium: 140 mmol/L (ref 135–145)

## 2019-03-06 LAB — URINALYSIS, ROUTINE W REFLEX MICROSCOPIC
Bilirubin Urine: NEGATIVE
Glucose, UA: NEGATIVE mg/dL
Ketones, ur: NEGATIVE mg/dL
Leukocytes,Ua: NEGATIVE
Nitrite: NEGATIVE
Protein, ur: NEGATIVE mg/dL
Specific Gravity, Urine: >1.046 — ABNORMAL HIGH (ref 1.005–1.030)
pH: 6 (ref 5.0–8.0)

## 2019-03-06 LAB — LIPASE, BLOOD: Lipase: 33 U/L (ref 11–51)

## 2019-03-06 LAB — POC OCCULT BLOOD, ED: Fecal Occult Bld: POSITIVE — AB

## 2019-03-06 LAB — SARS CORONAVIRUS 2 (TAT 6-24 HRS): SARS Coronavirus 2: NEGATIVE

## 2019-03-06 MED ORDER — CYCLOSPORINE 0.05 % OP EMUL
1.0000 [drp] | Freq: Two times a day (BID) | OPHTHALMIC | Status: DC
  Administered 2019-03-06 – 2019-03-08 (×5): 1 [drp] via OPHTHALMIC
  Filled 2019-03-06 (×7): qty 30

## 2019-03-06 MED ORDER — LORATADINE 10 MG PO TABS
10.0000 mg | ORAL_TABLET | Freq: Every day | ORAL | Status: DC
  Administered 2019-03-06 – 2019-03-08 (×3): 10 mg via ORAL
  Filled 2019-03-06 (×3): qty 1

## 2019-03-06 MED ORDER — UMECLIDINIUM BROMIDE 62.5 MCG/INH IN AEPB
1.0000 | INHALATION_SPRAY | Freq: Every day | RESPIRATORY_TRACT | Status: DC
  Administered 2019-03-07 – 2019-03-08 (×2): 1 via RESPIRATORY_TRACT
  Filled 2019-03-06: qty 7

## 2019-03-06 MED ORDER — ACETAMINOPHEN 325 MG PO TABS
650.0000 mg | ORAL_TABLET | Freq: Four times a day (QID) | ORAL | Status: DC | PRN
  Administered 2019-03-06 – 2019-03-08 (×4): 650 mg via ORAL
  Filled 2019-03-06 (×4): qty 2

## 2019-03-06 MED ORDER — ONDANSETRON 4 MG PO TBDP
4.0000 mg | ORAL_TABLET | Freq: Three times a day (TID) | ORAL | Status: DC | PRN
  Filled 2019-03-06: qty 1

## 2019-03-06 MED ORDER — POLYETHYLENE GLYCOL 3350 17 GM/SCOOP PO POWD
1.0000 | Freq: Once | ORAL | Status: AC
  Administered 2019-03-06: 255 g via ORAL
  Filled 2019-03-06: qty 255

## 2019-03-06 MED ORDER — ONDANSETRON 4 MG PO TBDP
4.0000 mg | ORAL_TABLET | Freq: Three times a day (TID) | ORAL | Status: DC | PRN
  Administered 2019-03-06: 4 mg via ORAL
  Filled 2019-03-06 (×3): qty 1

## 2019-03-06 MED ORDER — FLUTICASONE PROPIONATE 50 MCG/ACT NA SUSP
2.0000 | Freq: Every day | NASAL | Status: DC
  Administered 2019-03-06 – 2019-03-08 (×3): 2 via NASAL
  Filled 2019-03-06: qty 16

## 2019-03-06 MED ORDER — HYDROXYZINE HCL 25 MG PO TABS: 25.0000 mg | ORAL_TABLET | Freq: Two times a day (BID) | ORAL | Status: DC | PRN

## 2019-03-06 MED ORDER — DILTIAZEM HCL ER COATED BEADS 120 MG PO CP24
120.0000 mg | ORAL_CAPSULE | Freq: Every day | ORAL | Status: DC
  Administered 2019-03-06 – 2019-03-07 (×2): 120 mg via ORAL
  Filled 2019-03-06 (×2): qty 1

## 2019-03-06 MED ORDER — ALBUTEROL SULFATE (2.5 MG/3ML) 0.083% IN NEBU: 2.5000 mg | INHALATION_SOLUTION | Freq: Four times a day (QID) | RESPIRATORY_TRACT | Status: DC | PRN

## 2019-03-06 MED ORDER — IOHEXOL 300 MG/ML  SOLN
125.0000 mL | Freq: Once | INTRAMUSCULAR | Status: AC | PRN
  Administered 2019-03-06: 01:00:00 125 mL via INTRAVENOUS

## 2019-03-06 MED ORDER — ACETAMINOPHEN 650 MG RE SUPP: 650.0000 mg | Freq: Four times a day (QID) | RECTAL | Status: DC | PRN

## 2019-03-06 MED ORDER — VENLAFAXINE HCL ER 150 MG PO CP24
150.0000 mg | ORAL_CAPSULE | Freq: Every day | ORAL | Status: DC
  Administered 2019-03-07 – 2019-03-08 (×2): 150 mg via ORAL
  Filled 2019-03-06 (×5): qty 1

## 2019-03-06 MED ORDER — METOPROLOL TARTRATE 25 MG PO TABS
25.0000 mg | ORAL_TABLET | Freq: Two times a day (BID) | ORAL | Status: DC
  Administered 2019-03-06 – 2019-03-07 (×4): 25 mg via ORAL
  Filled 2019-03-06 (×5): qty 1

## 2019-03-06 MED ORDER — DICLOFENAC SODIUM 1 % TD GEL: 2.0000 g | Freq: Four times a day (QID) | TRANSDERMAL | Status: DC | PRN

## 2019-03-06 MED ORDER — ONDANSETRON HCL 4 MG/2ML IJ SOLN: 4.0000 mg | Freq: Three times a day (TID) | INTRAMUSCULAR | Status: DC | PRN

## 2019-03-06 MED ORDER — CYCLOBENZAPRINE HCL 10 MG PO TABS: 10.0000 mg | ORAL_TABLET | Freq: Three times a day (TID) | ORAL | Status: DC | PRN

## 2019-03-06 MED ORDER — PANTOPRAZOLE SODIUM 40 MG PO TBEC
40.0000 mg | DELAYED_RELEASE_TABLET | Freq: Two times a day (BID) | ORAL | Status: DC
  Administered 2019-03-06 – 2019-03-08 (×5): 40 mg via ORAL
  Filled 2019-03-06 (×5): qty 1

## 2019-03-06 MED ORDER — ONDANSETRON HCL 4 MG PO TABS: 4.0000 mg | ORAL_TABLET | Freq: Three times a day (TID) | ORAL | Status: DC | PRN

## 2019-03-06 MED ORDER — SODIUM CHLORIDE 0.9 % IV BOLUS
1000.0000 mL | Freq: Once | INTRAVENOUS | Status: AC
  Administered 2019-03-06: 1000 mL via INTRAVENOUS

## 2019-03-06 MED ORDER — FLECAINIDE ACETATE 100 MG PO TABS
100.0000 mg | ORAL_TABLET | Freq: Two times a day (BID) | ORAL | Status: DC
  Administered 2019-03-06 – 2019-03-08 (×5): 100 mg via ORAL
  Filled 2019-03-06 (×7): qty 1

## 2019-03-06 NOTE — ED Notes (Signed)
Pt having intermittent desaturations into low 80's while sleeping, and difficulty staying awake but arousable. Resident paged. 02 raised to 5L initially and then 3 when she re-saturated. Per resident, encourage pt to sleep on her side.

## 2019-03-06 NOTE — ED Provider Notes (Signed)
[facility] EMERGENCY DEPARTMENT Provider Note   CSN: [id number] Arrival date & time: 03/05/19  2322     History   Chief Complaint Chief Complaint  Patient presents with  . Abdominal Pain    HPI [name] White is a 63 y.o. female. Patient is 62 year old female with past medical history of COPD, HTN, A. fib, PE on Xarelto.  Patient states she fell and hit her head 9 days ago and was assessed at that time in the ED however, patient states that 4 days ago she has had abdominal pain and noticed bruising on her abdomen.   Patient notices bright red blood in stool 2 hours ago and denies pain with defecation.     HPI  Past Medical History:  Diagnosis Date  . Anxiety   . Asthma   . Chronic lower back pain   . COPD (chronic obstructive pulmonary disease) ([place])   . Depression   . Family history of anesthesia complication    "daughter; causes her to pass out afterwards"  . GERD (gastroesophageal reflux disease)   . Hypertension   . Hyperthyroidism   . Migraine    "monthly" (12/28/2013)  . Osteoarthritis    "both knees; back of my neck; right pelvic bone" (12/28/2013)  . Paroxysmal A-fib ([place])   . Pulmonary embolism ([place]) 12/28/2013   "2 clots in each lung"    Patient Active Problem List   Diagnosis Date Noted  . High risk social situation   . COPD exacerbation ([place]) 02/26/2019  . Urge incontinence 01/27/2019  . Homelessness 12/30/2018  . Encounter for rehabilitation 12/04/2018  . Exposure to Covid-19 Virus 11/02/2018  . Reactive airway disease 09/10/2018  . Shortness of breath 09/09/2018  . Acute bronchitis 08/29/2018  . Acute on chronic respiratory failure with hypoxia ([place]) 08/29/2018  . Sleep apnea 07/05/2018  . Chronic respiratory failure with hypoxia ([place]) 07/05/2018  . Osteoarthritis 03/02/2018  . Viral illness 02/13/2018  . Nausea 02/13/2018  . Left medial tibial stress syndrome 12/26/2017  . Hematoma 11/27/2017  . Severe episode of recurrent  major depressive disorder, without psychotic features ([place])   . MDD (major depressive disorder), recurrent episode, severe ([place]) 09/07/2015  . Atrial flutter ([place]) 07/29/2015  . History of atrial flutter 06/26/2015  . Chest pain 03/22/2015  . Asthma 11/12/2014  . History of hypertension 11/05/2014  . Atrial fibrillation, chronic 11/03/2014  . Chronic anticoagulation 11/03/2014  . History of pulmonary embolus (PE) 11/03/2014  . Hypokalemia 11/03/2014  . Non-traumatic rhabdomyolysis 11/03/2014  . Major depressive disorder 11/03/2014  . SOB (shortness of breath)   . MDD (major depressive disorder), recurrent severe, without psychosis ([place]) 03/05/2014  . Pulmonary embolism ([place]) 12/27/2013  . Vocal cord dysfunction 11/12/2013  . Mixed headache 01/06/2012  . Hot flashes 04/22/2011  . OVERACTIVE BLADDER 04/18/2008  . Osteoarthritis of both knees 04/18/2008  . Osteoarthritis involving multiple joints on both sides of body 09/14/2007  . Morbid obesity ([place]) 08/24/2006  . RESTLESS LEGS SYNDROME 08/24/2006  . HYPERTENSION, BENIGN SYSTEMIC 08/24/2006  . RHINITIS, ALLERGIC 08/24/2006  . GASTROESOPHAGEAL REFLUX, NO ESOPHAGITIS 08/24/2006    Past Surgical History:  Procedure Laterality Date  . ABDOMINAL HYSTERECTOMY    . APPENDECTOMY    . BREAST CYST EXCISION Right   . DILATION AND CURETTAGE OF UTERUS    . ELECTROPHYSIOLOGIC STUDY N/A 05/27/2015   Procedure: A-Flutter;  Surgeon: Evans Lance, MD;  Location: [facility];  Service: Cardiovascular;  Laterality: N/A;  .  EXCISIONAL HEMORRHOIDECTOMY    . NASAL SINUS SURGERY  2007  . THYROIDECTOMY, PARTIAL Right 2005  . TUBAL LIGATION    . WISDOM TOOTH EXTRACTION       OB History   No obstetric history on file.      Home Medications    Prior to Admission medications   Medication Sig Start Date End Date Taking? Authorizing Provider  acetaminophen (TYLENOL) 325 MG tablet Take 2 tablets (650 mg total) by mouth every 6 (six)  hours as needed for mild pain. 09/11/18   Georgette Shell, MD  albuterol (PROVENTIL) (2.5 MG/3ML) 0.083% nebulizer solution Take 3 mLs (2.5 mg total) by nebulization every 6 (six) hours as needed for wheezing or shortness of breath. 07/09/18   Collene Gobble, MD  aspirin-acetaminophen-caffeine (EXCEDRIN MIGRAINE) [phone] MG tablet Take 1 tablet by mouth every 8 (eight) hours as needed for headache. 02/28/19   Kathrene Alu, MD  azelastine (OPTIVAR) 0.05 % ophthalmic solution Place 1 drop into both eyes 2 (two) times daily as needed (itchy eyes).  08/02/18   [provider]  cyclobenzaprine (FLEXERIL) 10 MG tablet TAKE 1 TABLET BY MOUTH EVERY 8 HOURS AS NEEDED FOR PAIN 03/05/19   [name], MD  cycloSPORINE (RESTASIS) 0.05 % ophthalmic emulsion Place 1 drop into both eyes 2 (two) times daily.    [provider]  diclofenac sodium (VOLTAREN) 1 % GEL APPLY 2 GRAMS EXTERNALLY TO THE AFFECTED AREA FOUR TIMES DAILY Patient taking differently: Apply 2 g topically 4 (four) times daily as needed (pain).  07/31/18   [name], MD  diltiazem (CARDIZEM CD) 120 MG 24 hr capsule Take 1 capsule (120 mg total) by mouth daily. Please keep upcoming appt in January before anymore refills. Final Attempt Patient taking differently: Take 120 mg by mouth at bedtime. Please keep upcoming appt in January before anymore refills. Final Attempt 07/04/18   Patsey Berthold, NP  famotidine (PEPCID) 20 MG tablet Take 1 tablet (20 mg total) by mouth daily. 12/22/18   [name], MD  flecainide (TAMBOCOR) 100 MG tablet TAKE 1 TABLET BY MOUTH 2 TIMES A DAY. PLEASE KEEP UPCOMING APPOINTMENT IN JANUARY BEFORE ANYMORE REFILLS. Patient taking differently: Take 100 mg by mouth 2 (two) times daily.  12/03/18   Seiler, Amber K, NP  fluticasone (FLONASE) 50 MCG/ACT nasal spray Place 2 sprays into both nostrils every morning. 10/29/18   [provider]  hydrocortisone (ANUSOL-HC) 25 MG suppository Place 1  suppository (25 mg total) rectally 2 (two) times daily. Patient taking differently: Place 25 mg rectally 2 (two) times daily as needed for hemorrhoids or anal itching.  08/20/18   [place] Donnamarie Poag, DO  hydrOXYzine (VISTARIL) 25 MG capsule Take 25 mg by mouth 2 (two) times daily as needed for anxiety. 11/26/18   [provider]  loratadine (CLARITIN) 10 MG tablet TAKE 1 TABLET(10 MG) BY MOUTH DAILY 03/05/19   [name], MD  MAGNESIUM-OXIDE 400 (241.3 Mg) MG tablet TAKE 1 TABLET BY MOUTH TWICE DAILY Patient not taking: No sig reported 12/07/18   [name], MD  metoprolol tartrate (LOPRESSOR) 25 MG tablet TAKE 1 TABLET BY MOUTH TWICE DAILY Patient taking differently: Take 25 mg by mouth 2 (two) times daily.  12/22/18   [name], MD  OXYGEN 2L at bedtime and prn exertion    [provider]  pantoprazole (PROTONIX) 40 MG tablet TAKE 1 TABLET BY MOUTH TWICE DAILY Patient not taking: No sig reported 12/07/18  [name], MD  Potassium Chloride ER 20 MEQ TBCR TAKE 2 TABLETS BY MOUTH TWICE DAILY Patient not taking: Reported on 02/26/2019 01/10/19   [name], MD  predniSONE (DELTASONE) 20 MG tablet Take 2 tablets (40 mg total) by mouth daily with breakfast. 03/01/19   Kathrene Alu, MD  umeclidinium bromide (INCRUSE ELLIPTA) 62.5 MCG/INH AEPB Inhale 1 puff into the lungs daily. 03/01/19   Kathrene Alu, MD  venlafaxine XR (EFFEXOR-XR) 150 MG 24 hr capsule Take 150 mg by mouth daily with breakfast.    [provider]  XARELTO 20 MG TABS tablet TAKE 1 TABLET(20 MG) BY MOUTH DAILY AFTER SUPPER Patient taking differently: Take 20 mg by mouth daily with supper.  12/03/18   Patsey Berthold, NP    Family History Family History  Problem Relation Age of Onset  . Osteoarthritis Mother   . Asthma Mother   . Heart failure Mother   . Breast cancer Daughter     Social History Social History   Tobacco Use  . Smoking status: Former Smoker    Packs/day: 0.25     Years: 20.00    Pack years: 5.00    Types: Cigarettes    Quit date: 07/17/1999    Years since quitting: 19.6  . Smokeless tobacco: Never Used  Substance Use Topics  . Alcohol use: Not Currently    Comment: "drank some in my 30's"  . Drug use: No     Allergies   Caffeine, Hydralazine hcl, Hydrocodone, Ciprofloxacin, Erythromycin, Lisinopril, Oxycodone, and Sulfamethoxazole-trimethoprim   Review of Systems Review of Systems  Constitutional: Negative for chills and fever.  HENT: Negative for congestion, sinus pressure and sinus pain.   Eyes: Negative for pain.  Respiratory: Positive for shortness of breath (chronic ). Negative for cough.   Cardiovascular: Negative for chest pain and leg swelling.  Gastrointestinal: Negative for abdominal pain and vomiting.  Genitourinary: Negative for dysuria.  Musculoskeletal: Negative for myalgias.  Skin: Negative for rash.  Neurological: Negative for dizziness and headaches.     Physical Exam Updated Vital Signs BP 112/64   Pulse (!) 112   Temp 98.4 F (36.9 C) (Oral)   Resp (!) 26   Ht 5\' 5"  (1.651 m)   Wt (!) 161.8 kg   SpO2 96%   BMI 59.37 kg/m   Physical Exam Vitals signs and nursing note reviewed. Exam conducted with a chaperone present.  Constitutional:      Appearance: She is obese. She is not ill-appearing or toxic-appearing.  HENT:     Head: Normocephalic and atraumatic.  Eyes:     General: No scleral icterus. Neck:     Musculoskeletal: No neck rigidity.  Cardiovascular:     Rate and Rhythm: Normal rate and regular rhythm.     Pulses: Normal pulses.     Heart sounds: Normal heart sounds.  Pulmonary:     Effort: Pulmonary effort is normal.     Breath sounds: Normal breath sounds.  Abdominal:     General: Abdomen is flat and protuberant. Bowel sounds are normal.     Palpations: Abdomen is soft.     Tenderness: There is abdominal tenderness. There is no guarding or rebound.     Hernia: No hernia is present.      Comments: Abdomen with large area of ecchymosis right upper abdomen and epigastric.  Second area of ecchymosis on the right flank. Abdomen is diffusely tender with severe tenderness over areas of ecchymosis.  Genitourinary:  Rectum: Normal. Guaiac result positive. No tenderness.     Comments: Rectal exam bleed with chaperone in room.  No obvious blood on examination. Musculoskeletal:     Right lower leg: No edema.     Left lower leg: No edema.  Skin:    General: Skin is warm and dry.     Capillary Refill: Capillary refill takes less than 2 seconds.     Comments: Bruising over abdomen   Neurological:     Mental Status: She is alert. Mental status is at baseline.  Psychiatric:        Behavior: Behavior normal.      ED Treatments / Results  Labs (all labs ordered are listed, but only abnormal results are displayed) Labs Reviewed  LIPASE, BLOOD  COMPREHENSIVE METABOLIC PANEL  CBC  URINALYSIS, ROUTINE W REFLEX MICROSCOPIC    EKG None  Radiology Ct Abdomen Pelvis W Contrast  Result Date: 03/06/2019 CLINICAL DATA:  Abdominal trauma, blunt EXAM: CT ABDOMEN AND PELVIS WITH CONTRAST TECHNIQUE: Multidetector CT imaging of the abdomen and pelvis was performed using the standard protocol following bolus administration of intravenous contrast. CONTRAST:  190mL OMNIPAQUE IOHEXOL 300 MG/ML  SOLN COMPARISON:  None. FINDINGS: Lower chest: The visualized heart size within normal limits. No pericardial fluid/thickening. No hiatal hernia. The visualized portions of the lungs are clear. Hepatobiliary: There is a 1 cm tiny hypodense lesion seen in the anterior left liver lobe.The main portal vein is patent. No evidence of calcified gallstones, gallbladder wall thickening or biliary dilatation. Pancreas: Unremarkable. No pancreatic ductal dilatation or surrounding inflammatory changes. Spleen: Normal in size without focal abnormality. Adrenals/Urinary Tract: Both adrenal glands appear normal. The  kidneys and collecting system appear normal without evidence of urinary tract calculus or hydronephrosis. Bladder is unremarkable. Stomach/Bowel: The stomach, small bowel, are normal in appearance. Scattered colonic diverticula are noted. No inflammatory changes, wall thickening, or obstructive findings. Vascular/Lymphatic: There are no enlarged mesenteric, retroperitoneal, or pelvic lymph nodes. No significant vascular findings are present. Reproductive: The patient is status post hysterectomy. No adnexal masses or collections seen. Other: Along the anterior abdominal wall there is diffuse subcutaneous stranding with subcutaneous hematomas seen anteriorly the largest measuring 2.1 cm. There is also subcutaneous stranding changes seen the right and left lateral abdominal wall of well as the posterior right back. Musculoskeletal: No acute or significant osseous findings. Partially visualized fat containing mass seen within the left adductor musculature. IMPRESSION: Anterior abdominal wall contusions with small subcutaneous hematoma. No acute intra-abdominal or pelvic injury. Electronically Signed   By: Prudencio Pair M.D.   On: 03/06/2019 01:39    Procedures Procedures (including critical care time)  Medications Ordered in ED Medications  sodium chloride flush (NS) 0.9 % injection 3 mL (has no administration in time range)     Initial Impression / Assessment and Plan / ED Course  I have reviewed the triage vital signs and the nursing notes.  Pertinent labs & imaging results that were available during my care of the patient were reviewed by me and considered in my medical decision making (see chart for details).  Clinical Course as of Mar 05 32  Wed Mar 06, 2019  0032 Stool Hemoccult sample gathered at this time.  Chaperoned by ED technician [place].  No obvious blood on visual inspection.   [WF]    Clinical Course User Index [WF] Tedd Sias, PA   POC occult blood test is positive for  microscopic blood.  Patiently is currently on Xarelto.  Hemoglobin slightly decreased from prior ED visit. Pulse rate is consistently elevated during ED visit today most recently 115.  Patient has orthostatic changes to blood pressure and heart rate.  We are planning PA-C discussed case with internal medicine team.  Patient will be admitted for observation.        Final Clinical Impressions(s) / ED Diagnoses   Final diagnoses:  None    ED Discharge Orders    None       Tedd Sias, [place] [id number]    Delora Fuel, MD [id number] [phone]

## 2019-03-06 NOTE — Consult Note (Signed)
Referring Provider: Family medicine Primary Care Physician:  [name], MD Primary Gastroenterologist: Althia Forts  Reason for Consultation: GI bleed  HPI: [name] White is a 63 y.o. female with past medical history of morbid obesity, history of paroxysmal atrial fibrillation on Xarelto, history of PE in 2015, COPD with chronic respiratory failure on 2 L oxygen presented to the hospital with abdominal pain and GI bleed.  GI is consulted for further evaluation.  Patient seen and examined in the emergency room.  Apparently patient had a fall last week resulting in abdominal hematoma.  During same time she started noticing dark-colored stool as well as bright red blood in the stool.  Complaining of generalized abdominal discomfort from the fall.  Denies any nausea or vomiting.  Denies diarrhea or constipation.  Denies unintentional weight loss.  Complaining of acid reflux which is well controlled with famotidine.  Denies any dysphagia or odynophagia.  No bowel movement today.  Complaining of baseline shortness of breath.  Brother was diagnosed with colon cancer at age 64.  No previous EGD or colonoscopy.  Denies NSAID use.  Past Medical History:  Diagnosis Date  . Abdominal wall hematoma 03/06/2019  . Acute bronchitis 08/29/2018  . Acute on chronic respiratory failure with hypoxia ([place]) 08/29/2018  . Anxiety   . Asthma   . Chronic lower back pain   . COPD (chronic obstructive pulmonary disease) ([place])   . Depression   . Exposure to Covid-19 Virus 11/02/2018  . Family history of anesthesia complication    "daughter; causes her to pass out afterwards"  . GERD (gastroesophageal reflux disease)   . History of atrial flutter 06/26/2015  . History of pulmonary embolus (PE) 11/03/2014  . Hypertension   . Hyperthyroidism   . Left medial tibial stress syndrome 12/26/2017  . Lower GI bleed   . Migraine    "monthly" (12/28/2013)  . Non-traumatic rhabdomyolysis 11/03/2014  . Obesity hypoventilation  syndrome ([place]) 03/06/2019  . Osteoarthritis    "both knees; back of my neck; right pelvic bone" (12/28/2013)  . Paroxysmal A-fib ([place])   . Pulmonary embolism ([place]) 12/28/2013   "2 clots in each lung"    Past Surgical History:  Procedure Laterality Date  . ABDOMINAL HYSTERECTOMY    . APPENDECTOMY    . BREAST CYST EXCISION Right   . DILATION AND CURETTAGE OF UTERUS    . ELECTROPHYSIOLOGIC STUDY N/A 05/27/2015   Procedure: A-Flutter;  Surgeon: Evans Lance, MD;  Location: [facility];  Service: Cardiovascular;  Laterality: N/A;  . EXCISIONAL HEMORRHOIDECTOMY    . NASAL SINUS SURGERY  2007  . THYROIDECTOMY, PARTIAL Right 2005  . TUBAL LIGATION    . WISDOM TOOTH EXTRACTION      Prior to Admission medications   Medication Sig Start Date End Date Taking? Authorizing Provider  acetaminophen (TYLENOL) 325 MG tablet Take 2 tablets (650 mg total) by mouth every 6 (six) hours as needed for mild pain. 09/11/18  Yes Georgette Shell, MD  albuterol (PROVENTIL) (2.5 MG/3ML) 0.083% nebulizer solution Take 3 mLs (2.5 mg total) by nebulization every 6 (six) hours as needed for wheezing or shortness of breath. 07/09/18  Yes Collene Gobble, MD  aspirin-acetaminophen-caffeine (EXCEDRIN MIGRAINE) [phone] MG tablet Take 1 tablet by mouth every 8 (eight) hours as needed for headache. 02/28/19  Yes Winfrey, Alcario Drought, MD  azelastine (OPTIVAR) 0.05 % ophthalmic solution Place 1 drop into both eyes 2 (two) times daily as needed (itchy eyes).  08/02/18  Yes [provider]  cyclobenzaprine (FLEXERIL) 10 MG tablet TAKE 1 TABLET BY MOUTH EVERY 8 HOURS AS NEEDED FOR PAIN Patient taking differently: Take 10 mg by mouth 3 (three) times daily as needed for muscle spasms.  03/05/19  Yes [name], MD  cycloSPORINE (RESTASIS) 0.05 % ophthalmic emulsion Place 1 drop into both eyes 2 (two) times daily.   Yes [provider]  diclofenac sodium (VOLTAREN) 1 % GEL APPLY 2 GRAMS EXTERNALLY TO THE  AFFECTED AREA FOUR TIMES DAILY Patient taking differently: Apply 2 g topically 4 (four) times daily as needed (pain).  07/31/18  Yes [name], MD  diltiazem (CARDIZEM CD) 120 MG 24 hr capsule Take 1 capsule (120 mg total) by mouth daily. Please keep upcoming appt in January before anymore refills. Final Attempt Patient taking differently: Take 120 mg by mouth at bedtime. Please keep upcoming appt in January before anymore refills. Final Attempt 07/04/18  Yes Seiler, Cecil Cobbs, NP  famotidine (PEPCID) 20 MG tablet Take 1 tablet (20 mg total) by mouth daily. 12/22/18  Yes [name], MD  flecainide (TAMBOCOR) 100 MG tablet TAKE 1 TABLET BY MOUTH 2 TIMES A DAY. PLEASE KEEP UPCOMING APPOINTMENT IN JANUARY BEFORE ANYMORE REFILLS. Patient taking differently: Take 100 mg by mouth 2 (two) times daily.  12/03/18  Yes Seiler, Amber K, NP  fluticasone (FLONASE) 50 MCG/ACT nasal spray Place 2 sprays into both nostrils daily.  10/29/18  Yes [provider]  hydrocortisone (ANUSOL-HC) 25 MG suppository Place 1 suppository (25 mg total) rectally 2 (two) times daily. Patient taking differently: Place 25 mg rectally 2 (two) times daily as needed for hemorrhoids or anal itching.  08/20/18  Yes [place] Donnamarie Poag, DO  hydrOXYzine (VISTARIL) 25 MG capsule Take 25 mg by mouth 2 (two) times daily as needed for anxiety. 11/26/18  Yes [provider]  loratadine (CLARITIN) 10 MG tablet TAKE 1 TABLET(10 MG) BY MOUTH DAILY Patient taking differently: Take 10 mg by mouth daily.  03/05/19  Yes [name], MD  metoprolol tartrate (LOPRESSOR) 25 MG tablet TAKE 1 TABLET BY MOUTH TWICE DAILY Patient taking differently: Take 25 mg by mouth 2 (two) times daily.  12/22/18  Yes [name], MD  umeclidinium bromide (INCRUSE ELLIPTA) 62.5 MCG/INH AEPB Inhale 1 puff into the lungs daily. 03/01/19  Yes Winfrey, Alcario Drought, MD  venlafaxine XR (EFFEXOR-XR) 150 MG 24 hr capsule Take 150 mg by mouth daily with breakfast.   Yes  [provider]  XARELTO 20 MG TABS tablet TAKE 1 TABLET(20 MG) BY MOUTH DAILY AFTER SUPPER Patient taking differently: Take 20 mg by mouth daily with supper.  12/03/18  Yes Seiler, Amber K, NP  MAGNESIUM-OXIDE 400 (241.3 Mg) MG tablet TAKE 1 TABLET BY MOUTH TWICE DAILY Patient not taking: No sig reported 12/07/18   [name], MD  OXYGEN 2L at bedtime and prn exertion    [provider]  pantoprazole (PROTONIX) 40 MG tablet TAKE 1 TABLET BY MOUTH TWICE DAILY Patient not taking: No sig reported 12/07/18   [name], MD  Potassium Chloride ER 20 MEQ TBCR TAKE 2 TABLETS BY MOUTH TWICE DAILY Patient not taking: Reported on 02/26/2019 01/10/19   [name], MD    Scheduled Meds: . cycloSPORINE  1 drop Both Eyes BID  . diltiazem  120 mg Oral QHS  . flecainide  100 mg Oral BID  . fluticasone  2 spray Each Nare Daily  . loratadine  10 mg Oral Daily  .  metoprolol tartrate  25 mg Oral BID  . pantoprazole  40 mg Oral BID  . polyethylene glycol powder  1 Container Oral Once  . umeclidinium bromide  1 puff Inhalation Daily  . venlafaxine XR  150 mg Oral Q breakfast   Continuous Infusions: PRN Meds:.acetaminophen **OR** acetaminophen, albuterol, cyclobenzaprine, diclofenac sodium, hydrOXYzine  Allergies as of 03/05/2019 - Review Complete 03/05/2019  Allergen Reaction Noted  . Caffeine Other (See Comments) 12/23/2012  . Hydralazine hcl  11/12/2014  . Hydrocodone Nausea And Vomiting 12/20/2018  . Ciprofloxacin Hives and Rash 01/22/2007  . Erythromycin Hives and Rash 01/22/2007  . Lisinopril Cough 03/17/2013  . Oxycodone Nausea And Vomiting 12/27/2013  . Sulfamethoxazole-trimethoprim Rash 01/22/2007    Family History  Problem Relation Age of Onset  . Osteoarthritis Mother   . Asthma Mother   . Heart failure Mother   . Breast cancer Daughter     Social History   Socioeconomic History  . Marital status: Single    Spouse name: Not on file  . Number of  children: 1  . Years of education: 65  . Highest education level: High school graduate  Occupational History  . Occupation: disabled   Social Needs  . Financial resource strain: Very hard  . Food insecurity    Worry: Often true    Inability: Often true  . Transportation needs    Medical: Yes    Non-medical: Yes  Tobacco Use  . Smoking status: Former Smoker    Packs/day: 0.25    Years: 20.00    Pack years: 5.00    Types: Cigarettes    Quit date: 07/17/1999    Years since quitting: 19.6  . Smokeless tobacco: Never Used  Substance and Sexual Activity  . Alcohol use: Not Currently    Comment: "drank some in my 30's"  . Drug use: No  . Sexual activity: Never  Lifestyle  . Physical activity    Days per week: Patient refused    Minutes per session: Patient refused  . Stress: To some extent  Relationships  . Social Herbalist on phone: Never    Gets together: Never    Attends religious service: Never    Active member of club or organization: No    Attends meetings of clubs or organizations: Never    Relationship status: Patient refused  . Intimate partner violence    Fear of current or ex partner: Patient refused    Emotionally abused: Patient refused    Physically abused: Patient refused    Forced sexual activity: Patient refused  Other Topics Concern  . Not on file  Social History Narrative   Disabled single patient.   Homeless   Food insecurities   Lack of transportation    Review of Systems: Review of Systems  Constitutional: Positive for malaise/fatigue. Negative for chills and fever.  HENT: Negative for hearing loss and tinnitus.   Eyes: Negative for blurred vision and double vision.  Respiratory: Positive for shortness of breath. Negative for cough.   Cardiovascular: Positive for leg swelling. Negative for chest pain and palpitations.  Gastrointestinal: Positive for abdominal pain, blood in stool and heartburn. Negative for constipation, diarrhea,  nausea and vomiting.  Genitourinary: Negative for dysuria and urgency.  Musculoskeletal: Positive for back pain, joint pain and myalgias.  Skin: Negative for itching and rash.  Neurological: Negative for dizziness, loss of consciousness and headaches.  Endo/Heme/Allergies: Bruises/bleeds easily.  Psychiatric/Behavioral: Negative for hallucinations and suicidal ideas.  Physical Exam: Vital signs: Vitals:   03/06/19 1300 03/06/19 1345  BP: (!) 114/59 100/69  Pulse: 98 73  Resp: (!) 25 17  Temp:    SpO2: 99% 100%     Physical Exam  Constitutional: She is oriented to person, place, and time. No distress.  Morbidly obese with mild shortness of breath  HENT:  Head: Normocephalic and atraumatic.  Eyes: EOM are normal. No scleral icterus.  Neck: Normal range of motion. Neck supple.  Cardiovascular: Normal rate, regular rhythm and normal heart sounds.  Pulmonary/Chest:  Anterior exam only.  Decreased breath sounds.  Mild shortness of breath but no significant respiratory distress  Abdominal: Bowel sounds are normal. She exhibits no distension. There is abdominal tenderness. There is no rebound and no guarding.  Ecchymosis on multiple areas noted  Musculoskeletal: Normal range of motion.        General: Edema present.  Neurological: She is alert and oriented to person, place, and time.  Skin: Skin is warm. No erythema.  Psychiatric: She has a normal mood and affect. Judgment and thought content normal.   GI:  Lab Results: Recent Labs    03/05/19 2353 03/06/19 0848  WBC 10.8* 10.3  HGB 10.2* 10.1*  HCT 33.8* 34.4*  PLT 464* 490*   BMET Recent Labs    03/05/19 2353 03/06/19 0848  NA 136 140  K 3.6 4.8  CL 100 103  CO2 26 30  GLUCOSE 134* 131*  BUN 14 13  CREATININE 0.75 0.62  CALCIUM 8.7* 8.6*   LFT Recent Labs    03/05/19 2353  PROT 7.0  ALBUMIN 3.0*  AST 22  ALT 25  ALKPHOS 85  BILITOT 0.3   PT/INR No results for input(s): LABPROT, INR in the last 72  hours.   Studies/Results: Ct Abdomen Pelvis W Contrast  Result Date: 03/06/2019 CLINICAL DATA:  Abdominal trauma, blunt EXAM: CT ABDOMEN AND PELVIS WITH CONTRAST TECHNIQUE: Multidetector CT imaging of the abdomen and pelvis was performed using the standard protocol following bolus administration of intravenous contrast. CONTRAST:  145mL OMNIPAQUE IOHEXOL 300 MG/ML  SOLN COMPARISON:  None. FINDINGS: Lower chest: The visualized heart size within normal limits. No pericardial fluid/thickening. No hiatal hernia. The visualized portions of the lungs are clear. Hepatobiliary: There is a 1 cm tiny hypodense lesion seen in the anterior left liver lobe.The main portal vein is patent. No evidence of calcified gallstones, gallbladder wall thickening or biliary dilatation. Pancreas: Unremarkable. No pancreatic ductal dilatation or surrounding inflammatory changes. Spleen: Normal in size without focal abnormality. Adrenals/Urinary Tract: Both adrenal glands appear normal. The kidneys and collecting system appear normal without evidence of urinary tract calculus or hydronephrosis. Bladder is unremarkable. Stomach/Bowel: The stomach, small bowel, are normal in appearance. Scattered colonic diverticula are noted. No inflammatory changes, wall thickening, or obstructive findings. Vascular/Lymphatic: There are no enlarged mesenteric, retroperitoneal, or pelvic lymph nodes. No significant vascular findings are present. Reproductive: The patient is status post hysterectomy. No adnexal masses or collections seen. Other: Along the anterior abdominal wall there is diffuse subcutaneous stranding with subcutaneous hematomas seen anteriorly the largest measuring 2.1 cm. There is also subcutaneous stranding changes seen the right and left lateral abdominal wall of well as the posterior right back. Musculoskeletal: No acute or significant osseous findings. Partially visualized fat containing mass seen within the left adductor musculature.  IMPRESSION: Anterior abdominal wall contusions with small subcutaneous hematoma. No acute intra-abdominal or pelvic injury. Electronically Signed   By: Ebony Cargo.D.  On: 03/06/2019 01:39    Impression/Plan: -GI bleed with dark stool as well as bright blood in the stool.  On Xarelto.  Most likely lower GI bleed. -Occult blood positive stool. -Family history of colon cancer in brother -Status post fall last week with abdominal wall hematoma. -Chronic anemia since February 2020. -History of atrial fibrillation.  On Xarelto.  Last dose day before yesterday (03/04/2019) -Chronic respiratory failure on oxygen  -History of COPD  Recommendations ----------------------- -Recommend conservative management for now.  Her baseline hemoglobin since February is around 11 to 12 .  Mild drop in hemoglobin to 10 could be from abdominal wall hematoma. -Continue Protonix for now. -Recommend EGD and colonoscopy once acute issues are resolved. -Advance diet to full liquid. -Recheck CBC in the morning -GI will follow    LOS: 0 days   Otis Brace  MD, FACP 03/06/2019, 2:25 PM  Contact #  [phone]

## 2019-03-06 NOTE — ED Notes (Signed)
Pt placed on purewick 

## 2019-03-06 NOTE — H&P (Addendum)
[facility] Admission History and Physical Service Pager: [phone]  Patient name: [name] White Medical record number: [id number] Date of birth: 1956/04/03 Age: 63 y.o. Gender: female  Primary Care Provider: [name], MD Consultants: GI Code Status: Full Emergency Contact: Rowan Blase [phone]  Chief Complaint: abdominal pain     Assessment and Plan: [name] White is a 63 y.o. female presenting with abdominal ecchymosis and pain. PMH is significant for HTN, GERD, paroxsymal atrial fibrillation, history of PE (2015), COPD, chronic respiratory failure on 2L O2,  asthma, MDD.  Large abdominal hematoma s/p recent fall  GI bleed with anemia Patient with 4 days of abdominal pain and 2 days of ecchymosis.  Went to the ED after having a dark stool with bright red blood seen in the toilet.  In the ED Hemoccult test was positive and patient was tachycardic with orthostatic changes to blood pressure and heart rate. On exam large hematoma present midline extending to the left and smaller hematoma on right upper abdomen with surrounding ecchymosis and is tender to palpation. Reports hematomas in the past where she was taken off of Xarelto for 3 days. Takes 20 mg of Xarelto for history of PE and AFIB. Hemoglobin stable at 10.2 with discharge hemoglobin 10.9.  We will continue to monitor. She has never had a colonoscopy before therefore cannot rule out colon cancer. Will consult GI  for recommendations.  GI bleed likely secondary to Xarelto use. Patient denies use of NSAIDs.  Denies current use of tobacco and alcohol. Not likely due to esophageal varices. Patient with history of GERD, GI bleed possibly due to peptic ulcer disease.  -Admit to cardiac telemetry, attending Dr. McDiarmid -NPO pending GI recs, give heart heart healthy diet if able -Consult GI in the morning, appreciate recommendations -Protonix 40 mg -CBC, BMP -Discontinue Xarelto -Consider  switching to Eliquis with decreased incidence of GI bleeds if still anticoagulating.  -Monitor for further signs of bleeding -Vitals per routine  Atrial Fibrillation: Diagnosed with A. fib in 2015.  Follows with cardiologist, Dr. Lovena Le.  Home regimen includes: Diltiazem 120mg  QD, Lopressor 25mg  BID, Flecainide 200mg  BID, and Xarelto 20mg  qHS.  EKG revealed sinus tachycardia with heart rate of 112.  -Hold home Xarelto, continue all other home meds -Repeat EKG at noon -Vital signs per unit routine  COPD Denies increased shortness of breath or sputum production.  Uses 2 L of oxygen at bedtime.  - Albuterol nebulizer q6h PRN -Pulse ox with vital signs -2 L of oxygen at bedtime  History of Pulmonary Embolism: History of PE in 2015 likely due to previous estrogen replacement and increased sedentary lifestyle after an injury.  Patient takes Xarelto.  We will hold Xarelto due to active GI bleed.  Consider switching to Eliquis.  -Hold home dose of Xarelto 20 mg   MDD/Anxiety: Stable.  Home medications: 150 mg Effexor daily and 25 mg hydroxyzine twice daily as needed - continue Effexor 150mg  QD  - continue Hydroxyzine 25mg  BID PRN   GERD: Stable.  Due to GI bleed we will hold home dose of Pepcid during hospitalization.  Give 40 mg Protonix daily for GI protection.  -Hold Pepcid -40 mg Protonix daily   Osteoarthritis Baseline joint pain.  Home meds include Voltaren gel and Tylenol for pain . -Continue home meds  Dry Eyes Patient uses Restasis at home. - continue home med  FEN/GI: Protonix, peripheral IV, MiraLAX Prophylaxis: SCDs  Disposition: Likely home pending medical clearance  History  of Present Illness:  [name] White is a 63 y.o. female presenting with painful bruising on her abdomen since yesterday however has had abdominal pain for the past 4 days.  She believes the bruising could be due to fall a little over a week ago. She said it started to turn red and then began  burning. [name] White came to the ED tonight after she had a dark bowel movement and noticed bright red blood in the toilet.  She takes Xarelto due to her having a bilateral PE's in 2015. She reports never having a colonoscopy. She denies any new falls. She reports that she has had hematomas before and she was taken off Xarelto for 3 days. She been taking tylenol for pain.  Denies recent NSAID use, nausea and vomiting and painful defecation.  Reports dizziness and lightheadedness with movement. She states this has been worse since she fell.  Otherwise, patient reports she has been well since being discharged from the hospital on 02/28/2019 for her fall and COPD exacerbation.   In the ED, patient had positive Hemoccult and CT abdomen pelvis indicated anterior abdominal wall contusions with small subcutaneous hematoma.  She was found to be tachycardic (HR 112) with an orthostatic component.  Labs showed hypoalbuminemia (3.0), mild hypocalcemia (8.7), normocytic anemia (Hgb 10.2, MCV 88.3), mild leukocytosis (10.8) and thrombocytosis (464).  Hemoglobin on the day of discharge (02/28/19) was 10.9.  Patient admitted for active GI bleed and we will consult GI for recommendations.   Review Of Systems: Per HPI with the following additions:   Review of Systems  Constitutional: Negative for fever.  HENT: Negative for congestion and sore throat.   Respiratory: Negative for cough. Shortness of breath: baseline.   Cardiovascular: Negative for chest pain.  Gastrointestinal: Positive for abdominal pain, blood in stool, constipation and melena. Negative for nausea and vomiting.  Genitourinary: Negative for dysuria.  Musculoskeletal: Negative for back pain, falls and neck pain.  Skin: Negative for rash.  Neurological: Positive for dizziness. Headaches: baseline.  Endo/Heme/Allergies: Bruises/bleeds easily.    Patient Active Problem List   Diagnosis Date Noted  . BRBPR (bright red blood per rectum) 03/06/2019  . High risk  social situation   . COPD exacerbation ([place]) 02/26/2019  . Urge incontinence 01/27/2019  . Homelessness 12/30/2018  . Encounter for rehabilitation 12/04/2018  . Exposure to Covid-19 Virus 11/02/2018  . Reactive airway disease 09/10/2018  . Shortness of breath 09/09/2018  . Acute bronchitis 08/29/2018  . Acute on chronic respiratory failure with hypoxia ([place]) 08/29/2018  . Sleep apnea 07/05/2018  . Chronic respiratory failure with hypoxia ([place]) 07/05/2018  . Osteoarthritis 03/02/2018  . Viral illness 02/13/2018  . Nausea 02/13/2018  . Left medial tibial stress syndrome 12/26/2017  . Hematoma 11/27/2017  . Severe episode of recurrent major depressive disorder, without psychotic features ([place])   . MDD (major depressive disorder), recurrent episode, severe ([place]) 09/07/2015  . Atrial flutter ([place]) 07/29/2015  . History of atrial flutter 06/26/2015  . Chest pain 03/22/2015  . Asthma 11/12/2014  . History of hypertension 11/05/2014  . Atrial fibrillation, chronic 11/03/2014  . Chronic anticoagulation 11/03/2014  . History of pulmonary embolus (PE) 11/03/2014  . Hypokalemia 11/03/2014  . Non-traumatic rhabdomyolysis 11/03/2014  . Major depressive disorder 11/03/2014  . SOB (shortness of breath)   . MDD (major depressive disorder), recurrent severe, without psychosis ([place]) 03/05/2014  . Pulmonary embolism ([place]) 12/27/2013  . Vocal cord dysfunction 11/12/2013  . Mixed headache 01/06/2012  . Hot flashes  04/22/2011  . OVERACTIVE BLADDER 04/18/2008  . Osteoarthritis of both knees 04/18/2008  . Osteoarthritis involving multiple joints on both sides of body 09/14/2007  . Morbid obesity ([place]) 08/24/2006  . RESTLESS LEGS SYNDROME 08/24/2006  . HYPERTENSION, BENIGN SYSTEMIC 08/24/2006  . RHINITIS, ALLERGIC 08/24/2006  . GASTROESOPHAGEAL REFLUX, NO ESOPHAGITIS 08/24/2006    Past Medical History: Past Medical History:  Diagnosis Date  . Anxiety   . Asthma   . Chronic lower back pain    . COPD (chronic obstructive pulmonary disease) ([place])   . Depression   . Family history of anesthesia complication    "daughter; causes her to pass out afterwards"  . GERD (gastroesophageal reflux disease)   . Hypertension   . Hyperthyroidism   . Migraine    "monthly" (12/28/2013)  . Osteoarthritis    "both knees; back of my neck; right pelvic bone" (12/28/2013)  . Paroxysmal A-fib ([place])   . Pulmonary embolism ([place]) 12/28/2013   "2 clots in each lung"    Past Surgical History: Past Surgical History:  Procedure Laterality Date  . ABDOMINAL HYSTERECTOMY    . APPENDECTOMY    . BREAST CYST EXCISION Right   . DILATION AND CURETTAGE OF UTERUS    . ELECTROPHYSIOLOGIC STUDY N/A 05/27/2015   Procedure: A-Flutter;  Surgeon: Evans Lance, MD;  Location: [facility];  Service: Cardiovascular;  Laterality: N/A;  . EXCISIONAL HEMORRHOIDECTOMY    . NASAL SINUS SURGERY  2007  . THYROIDECTOMY, PARTIAL Right 2005  . TUBAL LIGATION    . WISDOM TOOTH EXTRACTION      Social History: Social History   Tobacco Use  . Smoking status: Former Smoker    Packs/day: 0.25    Years: 20.00    Pack years: 5.00    Types: Cigarettes    Quit date: 07/17/1999    Years since quitting: 19.6  . Smokeless tobacco: Never Used  Substance Use Topics  . Alcohol use: Not Currently    Comment: "drank some in my 30's"  . Drug use: No    Please also refer to relevant sections of EMR.  Family History: Family History  Problem Relation Age of Onset  . Osteoarthritis Mother   . Asthma Mother   . Heart failure Mother   . Breast cancer Daughter    Allergies and Medications: Allergies  Allergen Reactions  . Caffeine Other (See Comments)    Migraine  . Hydralazine Hcl     Other reaction(s): Other (See Comments) AKI leading to rhabdo and electrolyte abnormalities  . Hydrocodone Nausea And Vomiting    Headache  . Ciprofloxacin Hives and Rash  . Erythromycin Hives and Rash  . Lisinopril Cough  .  Oxycodone Nausea And Vomiting    Headache  . Sulfamethoxazole-Trimethoprim Rash   No current facility-administered medications on file prior to encounter.    Current Outpatient Medications on File Prior to Encounter  Medication Sig Dispense Refill  . acetaminophen (TYLENOL) 325 MG tablet Take 2 tablets (650 mg total) by mouth every 6 (six) hours as needed for mild pain.    Marland Kitchen albuterol (PROVENTIL) (2.5 MG/3ML) 0.083% nebulizer solution Take 3 mLs (2.5 mg total) by nebulization every 6 (six) hours as needed for wheezing or shortness of breath. 75 mL 4  . aspirin-acetaminophen-caffeine (EXCEDRIN MIGRAINE) 250-250-65 MG tablet Take 1 tablet by mouth every 8 (eight) hours as needed for headache. 30 tablet 0  . azelastine (OPTIVAR) 0.05 % ophthalmic solution Place 1 drop into both eyes 2 (  two) times daily as needed (itchy eyes).     . cyclobenzaprine (FLEXERIL) 10 MG tablet TAKE 1 TABLET BY MOUTH EVERY 8 HOURS AS NEEDED FOR PAIN (Patient taking differently: Take 10 mg by mouth 3 (three) times daily as needed for muscle spasms. ) 60 tablet 0  . cycloSPORINE (RESTASIS) 0.05 % ophthalmic emulsion Place 1 drop into both eyes 2 (two) times daily.    . diclofenac sodium (VOLTAREN) 1 % GEL APPLY 2 GRAMS EXTERNALLY TO THE AFFECTED AREA FOUR TIMES DAILY (Patient taking differently: Apply 2 g topically 4 (four) times daily as needed (pain). ) 100 g 0  . diltiazem (CARDIZEM CD) 120 MG 24 hr capsule Take 1 capsule (120 mg total) by mouth daily. Please keep upcoming appt in January before anymore refills. Final Attempt (Patient taking differently: Take 120 mg by mouth at bedtime. Please keep upcoming appt in January before anymore refills. Final Attempt) 90 capsule 3  . famotidine (PEPCID) 20 MG tablet Take 1 tablet (20 mg total) by mouth daily. 90 tablet 0  . flecainide (TAMBOCOR) 100 MG tablet TAKE 1 TABLET BY MOUTH 2 TIMES A DAY. PLEASE KEEP UPCOMING APPOINTMENT IN JANUARY BEFORE ANYMORE REFILLS. (Patient taking  differently: Take 100 mg by mouth 2 (two) times daily. ) 60 tablet 6  . fluticasone (FLONASE) 50 MCG/ACT nasal spray Place 2 sprays into both nostrils daily.     . hydrocortisone (ANUSOL-HC) 25 MG suppository Place 1 suppository (25 mg total) rectally 2 (two) times daily. (Patient taking differently: Place 25 mg rectally 2 (two) times daily as needed for hemorrhoids or anal itching. ) 12 suppository 0  . hydrOXYzine (VISTARIL) 25 MG capsule Take 25 mg by mouth 2 (two) times daily as needed for anxiety.    Marland Kitchen loratadine (CLARITIN) 10 MG tablet TAKE 1 TABLET(10 MG) BY MOUTH DAILY (Patient taking differently: Take 10 mg by mouth daily. ) 90 tablet 0  . metoprolol tartrate (LOPRESSOR) 25 MG tablet TAKE 1 TABLET BY MOUTH TWICE DAILY (Patient taking differently: Take 25 mg by mouth 2 (two) times daily. ) 120 tablet 0  . predniSONE (DELTASONE) 20 MG tablet Take 2 tablets (40 mg total) by mouth daily with breakfast. 3 tablet 0  . umeclidinium bromide (INCRUSE ELLIPTA) 62.5 MCG/INH AEPB Inhale 1 puff into the lungs daily. 7 each 0  . venlafaxine XR (EFFEXOR-XR) 150 MG 24 hr capsule Take 150 mg by mouth daily with breakfast.    . XARELTO 20 MG TABS tablet TAKE 1 TABLET(20 MG) BY MOUTH DAILY AFTER SUPPER (Patient taking differently: Take 20 mg by mouth daily with supper. ) 30 tablet 6  . MAGNESIUM-OXIDE 400 (241.3 Mg) MG tablet TAKE 1 TABLET BY MOUTH TWICE DAILY (Patient not taking: No sig reported) 90 tablet 0  . OXYGEN 2L at bedtime and prn exertion    . pantoprazole (PROTONIX) 40 MG tablet TAKE 1 TABLET BY MOUTH TWICE DAILY (Patient not taking: No sig reported) 90 tablet 0  . Potassium Chloride ER 20 MEQ TBCR TAKE 2 TABLETS BY MOUTH TWICE DAILY (Patient not taking: Reported on 02/26/2019) 120 tablet 0    Objective: BP 124/68   Pulse (!) 115   Temp 98.4 F (36.9 C) (Oral)   Resp (!) 28   Ht 5\' 5"  (1.651 m)   Wt (!) 161.8 kg   SpO2 100%   BMI 59.37 kg/m  Exam: GEN:     alert, pleasant obese female in  no acute distress    HENT:  mucus membranes moist, oropharyngeal without lesions or erythema  EYES:   pupils equal and reactive, EOM intact NECK:  normal ROM RESP:  clear to auscultation bilaterally, no increased work of breathing, wearing nasal cannula  CVS:   regular rate and rhythm, no murmur, distal pulses intact   ABD:  soft, smaller right upper and large midline extended to left upper  hematomas with surrounding ecchymosis is tender to palpation; bowel sounds present; no palpable masses EXT:   normal ROM, atraumatic, no lower extremity edema  NEURO:  normal without focal findings,  speech normal, alert and oriented   Skin:   warm and dry, see above findings  Psych: Normal affect and thought content    Labs and Imaging: CBC BMET  Recent Labs  Lab 03/05/19 2353  WBC 10.8*  HGB 10.2*  HCT 33.8*  PLT 464*   Recent Labs  Lab 03/05/19 2353  NA 136  K 3.6  CL 100  CO2 26  BUN 14  CREATININE 0.75  GLUCOSE 134*  CALCIUM 8.7*     EKG: Sinus tachycardia, heart rate 112, borderline T wave abnormality  [name] White, [place], DO 03/06/2019, 3:24 AM PGY1, [place] Intern pager: [phone], text pages welcome  FPTS Upper-Level Resident Addendum   I have independently interviewed and examined the patient. I have discussed the above with the original author and agree with their documentation. My edits for correction/addition/clarification are in purple. Please see also any attending notes.    [name] Lexia Vandevender, DO PGY-3, [facility] 03/06/2019 6:30 AM  FPTS Service pager: [phone] (text pages welcome through [facility] [place])

## 2019-03-06 NOTE — Discharge Summary (Signed)
[facility] Discharge Summary  Patient name: [name] White Medical record number: [id number] Date of birth: 08-21-1955 Age: 63 y.o. Gender: female Date of Admission: 03/05/2019  Date of Discharge: 03/07/2019 Admitting Physician: Lyndee Hensen, MD  Primary Care Provider: [name], MD Consultants: GI  Indication for Hospitalization: Abdominal hematoma on anticoagulation and lower GI bleed  Discharge Diagnoses/Problem List:  Paroxysmal A. Fib History of PE (2015) COPD (on 2L O2 at home) Hypertension GERD Asthma MDD Anxiety  Disposition: Home  Discharge Condition: Stable and improved  Discharge Exam:  Physical Exam Constitutional:      General: She is not in acute distress.    Appearance: She is obese. She is not ill-appearing.  HENT:     Head: Normocephalic and atraumatic.     Mouth/Throat:     Mouth: Mucous membranes are moist.     Pharynx: Oropharynx is clear.  Eyes:     General: No scleral icterus.    Pupils: Pupils are equal, round, and reactive to light.  Cardiovascular:     Rate and Rhythm: Normal rate and regular rhythm.     Heart sounds: Normal heart sounds.  Pulmonary:     Effort: Pulmonary effort is normal. No respiratory distress.     Breath sounds: Normal breath sounds. No wheezing, rhonchi or rales.  Abdominal:     General: Abdomen is protuberant. Bowel sounds are normal. There is no distension.     Palpations: Abdomen is soft. There is no hepatomegaly or mass.     Tenderness: There is abdominal tenderness (Related to contusion and hematoma) in the right upper quadrant and left upper quadrant. There is no right CVA tenderness, left CVA tenderness, guarding or rebound.     Hernia: No hernia is present.     Comments: Abdominal hematoma and contusion, 1 in left upper quadrant, 1 on right flank  Skin:    General: Skin is warm and dry.     Capillary Refill: Capillary refill takes less than 2 seconds.     Comments: See  abdominal exam for hematoma  Neurological:     General: No focal deficit present.     Mental Status: She is alert and oriented to person, place, and time.  Psychiatric:        Mood and Affect: Mood normal.        Behavior: Behavior normal.     Brief Hospital Course:  [name] White is a 63 year old woman with past medical history paroxysmal A. fib on her alto, history of PE (2015), COPD, chronic respiratory failure on 2 L O2 at home, asthma, MDD, anxiety and reported OSA.  Patient was admitted after she presented to the ED with an abdominal hematoma from a fall she sustained over a week ago.  CT revealed abdominal contusion, at the site of the hematoma.  A MRI previously done at the time of the fall revealed no other abnormalities including negative head bleed.  Her hemoglobin was stable at 10.2 throughout her admission.  She reported bright red blood per rectum, one episode of melena, and no evidence of bleeding during the admission.  She had a positive FOBT, as well as orthostatic vitals.  CHA2DS2-VASc score of 4. Xarelto was stopped, and patient was switched to apixaban before discharge without issue.  GI was consulted, she has never had a colonoscopy and had a brother who passed away from colon cancer.  They recommended she follow-up outpatient and receive a colonoscopy.  Patient's rectal bleed was likely  lower GI bleed, most likely diverticular in nature.  During this admission patient had several desaturations to the 60s while sleeping on her back with loud snoring.  OSA is charted in her history, but is not clear that she is ever received a sleep study.  She sleeps with oxygen on at home, but she also had 2 L of nasal cannula during this admission and was actively desaturating to the 60s while sleeping.  Recommend that she has a sleep study outpatient.  Patient was hypotensive this admission.  She is on diltiazem and metoprolol at home.  She is followed by a cardiologist.  Metoprolol was held on  1 day due to hypotension, patient recovered and stable at time of discharge.  Issues for Follow Up:  1. GI bleed: Follow-up with [facility] enterology as an outpatient for colonoscopy 2. Anticoagulation: With a chads vas score of 4 patient still requires anticoagulation.  She was switched to apixaban 5 mg p.o. twice daily.  Please assess for any signs of bleeding 3. OSA: Please refer patient for sleep study and CPAP 4. Hypertension: Patient had several episodes of hypotension during this admission, please assess as she may need medication changes for her hypertensive management. 5. COPD: Patient had previously been seen in the hospital the week before for COPD exacerbation.  She is very weak and deconditioned, so she was referred for home health.  This order was put in during this admission as well, please assess for progress.  Significant Procedures: CT  Significant Labs and Imaging:  Recent Labs  Lab 03/07/19 0338 03/07/19 1806 03/08/19 0328  WBC 9.7 9.7 9.6  HGB 9.2* 8.9* 9.4*  HCT 31.3* 29.8* 32.1*  PLT 438* 410* 413*   Recent Labs  Lab 03/05/19 2353 03/06/19 0848 03/07/19 0338  NA 136 140 139  K 3.6 4.8 3.9  CL 100 103 103  CO2 26 30 28   GLUCOSE 134* 131* 104*  BUN 14 13 11   CREATININE 0.75 0.62 0.79  CALCIUM 8.7* 8.6* 8.3*  ALKPHOS 85  --   --   AST 22  --   --   ALT 25  --   --   ALBUMIN 3.0*  --   --     CT abdomen and pelvis:  IMPRESSION: Anterior abdominal wall contusions with small subcutaneous hematoma. No acute intra-abdominal or pelvic injury.  Results/Tests Pending at Time of Discharge: none  Discharge Medications:  Allergies as of 03/08/2019      Reactions   Caffeine Other (See Comments)   Migraine   Hydralazine Hcl    Other reaction(s): Other (See Comments) AKI leading to rhabdo and electrolyte abnormalities   Hydrocodone Nausea And Vomiting   Headache   Ciprofloxacin Hives, Rash   Erythromycin Hives, Rash   Lisinopril Cough   Oxycodone Nausea  And Vomiting   Headache   Sulfamethoxazole-trimethoprim Rash      Medication List    STOP taking these medications   aspirin-acetaminophen-caffeine 250-250-65 MG tablet Commonly known as: EXCEDRIN MIGRAINE   Potassium Chloride ER 20 MEQ Tbcr   Xarelto 20 MG Tabs tablet Generic drug: rivaroxaban     TAKE these medications   acetaminophen 325 MG tablet Commonly known as: TYLENOL Take 2 tablets (650 mg total) by mouth every 6 (six) hours as needed for mild pain. Notes to patient: As needed for mild pain    albuterol (2.5 MG/3ML) 0.083% nebulizer solution Commonly known as: PROVENTIL Take 3 mLs (2.5 mg total) by nebulization every 6 (six)  hours as needed for wheezing or shortness of breath. Notes to patient: As needed for wheezing or shortness of breath   apixaban 5 MG Tabs tablet Commonly known as: ELIQUIS Take 1 tablet (5 mg total) by mouth 2 (two) times daily. Notes to patient: Blood thinner  Prevents clotting in heart chamber and heart attack or stroke Prevents pulmonary embolism (clot in lungs)   azelastine 0.05 % ophthalmic solution Commonly known as: OPTIVAR Place 1 drop into both eyes 2 (two) times daily as needed (itchy eyes). Notes to patient: As needed for itchy eyes   cyclobenzaprine 10 MG tablet Commonly known as: FLEXERIL TAKE 1 TABLET BY MOUTH EVERY 8 HOURS AS NEEDED FOR PAIN What changed:   when to take this  reasons to take this  additional instructions Notes to patient: As needed for pain caused by muscle spasms   cycloSPORINE 0.05 % ophthalmic emulsion Commonly known as: RESTASIS Place 1 drop into both eyes 2 (two) times daily. Notes to patient: Dry eyes    diclofenac sodium 1 % Gel Commonly known as: VOLTAREN APPLY 2 GRAMS EXTERNALLY TO THE AFFECTED AREA FOUR TIMES DAILY What changed: See the new instructions. Notes to patient: Topical cream for pain relief    diltiazem 120 MG 24 hr capsule Commonly known as: CARDIZEM CD Take 1 capsule  (120 mg total) by mouth daily. Please keep upcoming appt in January before anymore refills. Final Attempt What changed: when to take this Notes to patient: Treat atrial fibrillation   famotidine 20 MG tablet Commonly known as: PEPCID Take 1 tablet (20 mg total) by mouth daily. Notes to patient: Treat acid reflux  Prevents heartburn   flecainide 100 MG tablet Commonly known as: TAMBOCOR TAKE 1 TABLET BY MOUTH 2 TIMES A DAY. PLEASE KEEP UPCOMING APPOINTMENT IN JANUARY BEFORE ANYMORE REFILLS. What changed: See the new instructions. Notes to patient: Treat cardiac rhythm    fluticasone 50 MCG/ACT nasal spray Commonly known as: FLONASE Place 2 sprays into both nostrils daily. Notes to patient: Prevents nasal congestion and allergy symptoms   hydrocortisone 25 MG suppository Commonly known as: ANUSOL-HC Place 1 suppository (25 mg total) rectally 2 (two) times daily. What changed:   when to take this  reasons to take this   hydrOXYzine 25 MG capsule Commonly known as: VISTARIL Take 25 mg by mouth 2 (two) times daily as needed for anxiety. Notes to patient: As needed for anxiety   loratadine 10 MG tablet Commonly known as: CLARITIN TAKE 1 TABLET(10 MG) BY MOUTH DAILY What changed: See the new instructions. Notes to patient: Treat allergy    MAGnesium-Oxide 400 (241.3 Mg) MG tablet Generic drug: magnesium oxide TAKE 1 TABLET BY MOUTH TWICE DAILY Notes to patient: Supplement    metoprolol tartrate 25 MG tablet Commonly known as: LOPRESSOR Take 0.5 tablets (12.5 mg total) by mouth 2 (two) times daily. What changed: how much to take Notes to patient: Decreases work of the heart Lowers blood pressure and heart rate  Controls heart rhythm  DOSE CHANGE  Decrease from 25 mg 2 times a day to 12.5 mg 2 times a day   OXYGEN 2L at bedtime and prn exertion Notes to patient: As needed and at bedtime   pantoprazole 40 MG tablet Commonly known as: PROTONIX TAKE 1 TABLET BY MOUTH  TWICE DAILY Notes to patient: Treat acid reflux Prevents heartburn   umeclidinium bromide 62.5 MCG/INH Aepb Commonly known as: INCRUSE ELLIPTA Inhale 1 puff into the lungs daily. Notes to  patient: Prevents shortness of breath and wheezing   venlafaxine XR 150 MG 24 hr capsule Commonly known as: EFFEXOR-XR Take 150 mg by mouth daily with breakfast. Notes to patient: Depression/pain        Discharge Instructions: Please refer to Patient Instructions section of EMR for full details.  Patient was counseled important signs and symptoms that should prompt return to medical care, changes in medications, dietary instructions, activity restrictions, and follow up appointments.   Follow-Up Appointments: Follow-up Information    Brahmbhatt, Parag, MD. Call in 3 day(s).   Specialty: Gastroenterology Why: Please call [facility] to set up an appointment to get an endoscopy and colonoscopy to evaluate your bleeding.   Contact information: [address] [place] [address] [phone]        [name], MD. Go on 03/13/2019.   Specialty: Family Medicine Why: Please go to your follow-up appointment at 3:00PM. Contact information: [id number] [place] [address] [phone]        Health, [facility]-Home Follow up.   Specialty: Home Health Services Why: Registered Nurse, Physical and Occupational Therapies, Aide for bath-office to call you and schedule a visit  time.           Gladys Damme, MD 03/10/2019, 9:39 PM PGY-1, [place]

## 2019-03-06 NOTE — Progress Notes (Addendum)
FPTS Interim Progress Note  S: Patient is here for hematoma and contusion on her abdomen, states from a fall that happened on August 31.  She reports that she had bright red blood per rectum one time yesterday.  On admission patient had a positive FOBT, tachycardia to 112, and orthostatic hypotension.  Her hemoglobin is stable at 10.3, hemoglobin at discharge on September 3 was 10.9.  GI has been consulted, and Xarelto has been stopped.  O: BP 105/74   Pulse (!) 114   Temp 98.4 F (36.9 C) (Oral)   Resp (!) 25   Ht 5\' 5"  (1.651 m)   Wt (!) 161.8 kg   SpO2 97%   BMI 59.37 kg/m      A/P: Ms. [name] White is a 63 year old woman presenting with a GI bleed and anemia 1. GI bleed:  AM cbc  If evidence of brisk bleed, can consider tagged RBC scan   Appreciate GI recommendations  Likely diverticular bleed  Will start clear liquid diet  bowel prep for colonoscopy  CHA2DS2-VASc score 4-- high  D/C excedrin migraine  2. QTc: Manual calculation reveals QTc of 437 ms, a normal QT length. However, much background noise in EKG, repeat this afternoon.  Repeat ekg  Can use QT prolonging medications  3. OSA Has history of OSA, but does not use CPAP at home.  She had desaturations to the 80s while sleeping per nursing report in the ED.  Encourage patient to sleep on her side, she is satting well on 2 L nasal cannula now.  Will offer CPAP tonight.  4. Anticoagulation: Xarelto discontinued due to bleed.  Has CHA2DS2-VASc score of 4, indicating need for anticoagulation.  Using SCDs for prophylaxis now, once GI bleed has been addressed, will consider apixaban for anticoagulation at discharge.  Gladys Damme, MD 03/06/2019, 12:34 PM PGY-1, [facility] Service pager [phone]

## 2019-03-06 NOTE — ED Notes (Signed)
Lunch Tray Ordered @ 1102. 

## 2019-03-06 NOTE — Telephone Encounter (Signed)
[facility] called nurse line informing they are faxing over orders to be signed by Dr. Nori Riis, since she is an attending and listed. Informed her she will be back next week. I put fax in her box.

## 2019-03-06 NOTE — Patient Outreach (Signed)
[facility]) Care Management  03/06/2019  [name] [birth year] [id number]   CSW noted that patient presented to the Emergency Department at [facility] on Tuesday, March 05, 2019, via EMS (Emergency Medical Service), due to patient reporting blood in her stool and urine.  Patient was found to be Hemoccult positive.  Patient also admitted to experiencing abdominal pain and bruising, which she believes is a different result of a fall that she sustained on Monday, February 25, 2019, when she tripped on her oxygen tubing.  Patient is now tachycardic, with a drop in her blood pressure and an increase in her heart rate.  Patient has been admitted for observation due to the gastrointestinal bleed.  CSW will continue to follow patient while hospitalized, then resume community case management/social work services, once patient is discharged back to [facility].  [name], BSW, MSW, LCSW  Licensed Education officer, [facility]  Mailing [place] [address], [place], [place] [address] Physical Address-[address], [place], [place] [address] Toll Free Main # [phone] Fax # [phone] Cell # [phone]  Office # [phone] [name]

## 2019-03-06 NOTE — ED Provider Notes (Signed)
Patient presents to the emergency department with a chief complaint of abdominal pain and bruising.  She had a mechanical fall about 1 week ago after slipping on her oxygen tubing.  She fell and hit her head and also was found to have COPD exacerbation.  She was admitted to the family practice service at that time.  She states that over the past few days she has had developing of a large bruise/ecchymosis of her upper abdomen.  She reports some associated pain.  She is anticoagulated on Xarelto for PEs.  Additionally, patient reports having had some blood in her stool.  She is Hemoccult positive.  She has been tachycardic, and this dropped her blood pressure with an increase in her heart rate with orthostatic vital signs.  Patient was seen by and discussed with Dr. Roxanne Mins, who recommends admission for observation due to her GI bleed with less than perfect vital signs in a patient on Xarelto.  Appreciate family practice residency for admitting the patient.   Montine Circle, PA-C [id number]    Delora Fuel, MD [id number] [phone]

## 2019-03-06 NOTE — ED Notes (Signed)
Occult blood card at bedside.  

## 2019-03-07 ENCOUNTER — Ambulatory Visit: Payer: Self-pay | Admitting: *Deleted

## 2019-03-07 DIAGNOSIS — I10 Essential (primary) hypertension: Secondary | ICD-10-CM

## 2019-03-07 DIAGNOSIS — D6869 Other thrombophilia: Secondary | ICD-10-CM

## 2019-03-07 LAB — CBC WITH DIFFERENTIAL/PLATELET
Abs Immature Granulocytes: 0.06 10*3/uL (ref 0.00–0.07)
Basophils Absolute: 0 10*3/uL (ref 0.0–0.1)
Basophils Relative: 0 %
Eosinophils Absolute: 0.4 10*3/uL (ref 0.0–0.5)
Eosinophils Relative: 4 %
HCT: 31.3 % — ABNORMAL LOW (ref 36.0–46.0)
Hemoglobin: 9.2 g/dL — ABNORMAL LOW (ref 12.0–15.0)
Immature Granulocytes: 1 %
Lymphocytes Relative: 20 %
Lymphs Abs: 2 10*3/uL (ref 0.7–4.0)
MCH: 26.5 pg (ref 26.0–34.0)
MCHC: 29.4 g/dL — ABNORMAL LOW (ref 30.0–36.0)
MCV: 90.2 fL (ref 80.0–100.0)
Monocytes Absolute: 0.7 10*3/uL (ref 0.1–1.0)
Monocytes Relative: 7 %
Neutro Abs: 6.6 10*3/uL (ref 1.7–7.7)
Neutrophils Relative %: 68 %
Platelets: 438 10*3/uL — ABNORMAL HIGH (ref 150–400)
RBC: 3.47 MIL/uL — ABNORMAL LOW (ref 3.87–5.11)
RDW: 19.4 % — ABNORMAL HIGH (ref 11.5–15.5)
WBC: 9.7 10*3/uL (ref 4.0–10.5)
nRBC: 0.2 % (ref 0.0–0.2)

## 2019-03-07 LAB — CBC
HCT: 29.8 % — ABNORMAL LOW (ref 36.0–46.0)
Hemoglobin: 8.9 g/dL — ABNORMAL LOW (ref 12.0–15.0)
MCH: 26.9 pg (ref 26.0–34.0)
MCHC: 29.9 g/dL — ABNORMAL LOW (ref 30.0–36.0)
MCV: 90 fL (ref 80.0–100.0)
Platelets: 410 10*3/uL — ABNORMAL HIGH (ref 150–400)
RBC: 3.31 MIL/uL — ABNORMAL LOW (ref 3.87–5.11)
RDW: 19.4 % — ABNORMAL HIGH (ref 11.5–15.5)
WBC: 9.7 10*3/uL (ref 4.0–10.5)
nRBC: 0.2 % (ref 0.0–0.2)

## 2019-03-07 LAB — BASIC METABOLIC PANEL
Anion gap: 8 (ref 5–15)
BUN: 11 mg/dL (ref 8–23)
CO2: 28 mmol/L (ref 22–32)
Calcium: 8.3 mg/dL — ABNORMAL LOW (ref 8.9–10.3)
Chloride: 103 mmol/L (ref 98–111)
Creatinine, Ser: 0.79 mg/dL (ref 0.44–1.00)
GFR calc Af Amer: >60 mL/min (ref >60)
GFR calc non Af Amer: >60 mL/min (ref >60)
Glucose, Bld: 104 mg/dL — ABNORMAL HIGH (ref 70–99)
Potassium: 3.9 mmol/L (ref 3.5–5.1)
Sodium: 139 mmol/L (ref 135–145)

## 2019-03-07 MED ORDER — APIXABAN 5 MG PO TABS
5.0000 mg | ORAL_TABLET | Freq: Two times a day (BID) | ORAL | Status: DC
  Administered 2019-03-07 – 2019-03-08 (×2): 5 mg via ORAL
  Filled 2019-03-07 (×2): qty 1

## 2019-03-07 NOTE — Progress Notes (Signed)
Dr. Gladys Damme r paged and made aware of patient low Blood pressure readings after metoprolol and diltiazem administration and oxygen desaturations while patient asleep. Patient educated and willing to wear CPAP. Respiratory Therapist called to applied. Leveda Anna, BSN, RN

## 2019-03-07 NOTE — Progress Notes (Signed)
Pt was desaturating during her nap even with 02 on. Pt is comfortably resting on CPAP with 3 L bleed in.

## 2019-03-07 NOTE — Consult Note (Signed)
   [facility] CM Inpatient Consult   03/07/2019  [name] White 02/16/1956 [id number]   Patient screened for extreme high risk score for unplanned readmission score  And for less than 7 days readmission hospitalization. Patient is currently active with [facility] with a [facility] social worker currently for social issues support and resources.  Patient is currently from [facility].  Patient has been outreached by [place] Management team in the past.    Review of patient's medical record reveals patient is admitted for GI bleeding from assessment and plan notes: [name] White a 63 y.o.femalepresenting with abdominal ecchymosis and pain likely secondary to lower GI bleed and anticoagulation. PMH is significant forHTN, GERD,paroxsymalatrial fibrillation, history of PE (2015), COPD, chronic respiratory failure on 2L O2, asthma, MDD.  Primary Care Provider is currently, [name], MD with [facility], this office provides the transition of care follow up.  Plan:  Follow up with inpatient [facility] team for progress and disposition needs and notes. Continue to follow progress and disposition to assess for post hospital care management needs.   For questions contact:   Natividad Brood, RN BSN [name] Liaison  [phone] business mobile phone Toll free office [phone]  Fax number: [phone] [email or web address]

## 2019-03-07 NOTE — Progress Notes (Signed)
Family Medicine Teaching Service Daily Progress Note Intern Pager: [phone]  Patient name: [name] White Medical record number: [id number] Date of birth: Sep 30, 1955 Age: 63 y.o. Gender: female  Primary Care Provider: [name], MD Consultants: GI Code Status: Full  Pt Overview and Major Events to Date:  9/9- admitted  Assessment and Plan: [name] White is a 63 y.o. female presenting with abdominal ecchymosis and pain likely secondary to lower GI bleed and anticoagulation. PMH is significant for HTN, GERD, paroxsymal atrial fibrillation, history of PE (2015), COPD, chronic respiratory failure on 2L O2, asthma, MDD.  Large abdominal hematoma s/p recent fall  GI bleed with anemia Ecchymosis minimally improved on abdomen today. Hemoglobin stable at 9.7 today, will watch until tomorrow. Patient reports dark stool last night.  In the ED Hemoccult test was positive and patient was tachycardic with orthostatic changes to blood pressure and heart rate. We will continue to monitor. She has never had a colonoscopy before, and a fam hx of colon ca in her brother. GI following.  -Admit to cardiac telemetry, attending Dr. McDiarmid -Full liquid diet per GI -GI will f/u outpatient, appreciate recommendations -Protonix 40 mg -CBC AM -We will switch to Eliquis 5 mg po BID for anticoagulation.  -Monitor for further signs of bleeding -Vitals per routine  Paroxysmal Atrial Fibrillation: NSR this admission. Diagnosed with A. fib in 2015.  Follows with cardiologist, Dr. Lovena Le.  Home regimen includes: Diltiazem 120mg  QD, Lopressor 25mg  BID, Flecainide 200mg  BID, and Xarelto 20mg  qHS.  EKG revealed sinus tachycardia with heart rate of 112.  -Xarelto discontinued, CHA2DS2-VASc score is 4 -Start apixaban 5 mg po BID - continue all other home meds -Vital signs per unit routine  COPD Denies increased shortness of breath or sputum production.  Uses 2 L of oxygen at bedtime.  - Albuterol  nebulizer q6h PRN -Pulse ox with vital signs -2 L of oxygen at bedtime  History of Pulmonary Embolism: History of PE in 2015 likely due to previous estrogen replacement and increased sedentary lifestyle after an injury.  Discontinued xarelto.   -CHA2DS2-VASc score 4 -Start apixaban 5 mg po BID  MDD/Anxiety: Stable.  Home medications: 150 mg Effexor daily and 25 mg hydroxyzine twice daily as needed - continueEffexor 150mg  QD - continueHydroxyzine 25mg  BID PRN  GERD: Stable.  Due to GI bleed we will hold home dose of Pepcid during hospitalization.  Give 40 mg Protonix daily for GI protection.  -Hold Pepcid -40 mg Protonix daily  Osteoarthritis Baseline joint pain.  Home meds include Voltaren gel and Tylenol for pain . -Continue home meds  Dry Eyes Patient usesRestasis at home. - continue home med  FEN/GI: full liquid diet, protonix, PPx: SCDs  Disposition: to home tomorrow  Subjective:   Patient is feeling a little better today, ecchymosis mildly improved in color. Patient had no desaturations overnight. This provider discontinued O2 and watched her pulse ox, which remained above 95% on room air.  Objective: Temp:  [98.6 F (37 C)-98.7 F (37.1 C)] 98.7 F (37.1 C) (09/09 2118) Pulse Rate:  [73-100] 77 (09/10 0837) Resp:  [15-25] 20 (09/10 0837) BP: (100-121)/(59-89) 121/82 (09/09 2118) SpO2:  [98 %-100 %] 98 % (09/10 0837) Weight:  [159 kg] 159 kg (09/10 0325) Physical Exam: General: pleasant, obese woman in NAD Cardiovascular: RRR, no m/r/g Respiratory: CTAB, no increased WOB, upper airway wheezing present which is baseline, wheeze not transmitted through lower lung  Abdomen: soft, non-distended, tender to palpation at ecchymoses- 1 on LUQ ~  15 cm in size, color improving today, 1 on R flank ~7 cm in size, improving in color today. Extremities: trace non-pitting edema in bilateral LEs  Laboratory: Recent Labs  Lab 03/05/19 2353 03/06/19 0848  03/07/19 0338  WBC 10.8* 10.3 9.7  HGB 10.2* 10.1* 9.2*  HCT 33.8* 34.4* 31.3*  PLT 464* 490* 438*   Recent Labs  Lab 03/05/19 2353 03/06/19 0848 03/07/19 0338  NA 136 140 139  K 3.6 4.8 3.9  CL 100 103 103  CO2 26 30 28   BUN 14 13 11   CREATININE 0.75 0.62 0.79  CALCIUM 8.7* 8.6* 8.3*  PROT 7.0  --   --   BILITOT 0.3  --   --   ALKPHOS 85  --   --   ALT 25  --   --   AST 22  --   --   GLUCOSE 134* 131* 104*    Imaging/Diagnostic Tests: Ct Abdomen Pelvis W Contrast  Result Date: 03/06/2019 CLINICAL DATA:  Abdominal trauma, blunt EXAM: CT ABDOMEN AND PELVIS WITH CONTRAST TECHNIQUE: Multidetector CT imaging of the abdomen and pelvis was performed using the standard protocol following bolus administration of intravenous contrast. CONTRAST:  18mL OMNIPAQUE IOHEXOL 300 MG/ML  SOLN COMPARISON:  None. FINDINGS: Lower chest: The visualized heart size within normal limits. No pericardial fluid/thickening. No hiatal hernia. The visualized portions of the lungs are clear. Hepatobiliary: There is a 1 cm tiny hypodense lesion seen in the anterior left liver lobe.The main portal vein is patent. No evidence of calcified gallstones, gallbladder wall thickening or biliary dilatation. Pancreas: Unremarkable. No pancreatic ductal dilatation or surrounding inflammatory changes. Spleen: Normal in size without focal abnormality. Adrenals/Urinary Tract: Both adrenal glands appear normal. The kidneys and collecting system appear normal without evidence of urinary tract calculus or hydronephrosis. Bladder is unremarkable. Stomach/Bowel: The stomach, small bowel, are normal in appearance. Scattered colonic diverticula are noted. No inflammatory changes, wall thickening, or obstructive findings. Vascular/Lymphatic: There are no enlarged mesenteric, retroperitoneal, or pelvic lymph nodes. No significant vascular findings are present. Reproductive: The patient is status post hysterectomy. No adnexal masses or  collections seen. Other: Along the anterior abdominal wall there is diffuse subcutaneous stranding with subcutaneous hematomas seen anteriorly the largest measuring 2.1 cm. There is also subcutaneous stranding changes seen the right and left lateral abdominal wall of well as the posterior right back. Musculoskeletal: No acute or significant osseous findings. Partially visualized fat containing mass seen within the left adductor musculature. IMPRESSION: Anterior abdominal wall contusions with small subcutaneous hematoma. No acute intra-abdominal or pelvic injury. Electronically Signed   By: Prudencio Pair M.D.   On: 03/06/2019 01:39    Gladys Damme, MD 03/07/2019, 10:42 AM PGY-1, [place] Intern pager: [phone], text pages welcome

## 2019-03-07 NOTE — Progress Notes (Signed)
Patient refused CPAP. States she wears 02 HS.

## 2019-03-08 ENCOUNTER — Encounter (HOSPITAL_COMMUNITY): Payer: Self-pay | Admitting: General Practice

## 2019-03-08 LAB — CBC
HCT: 32.1 % — ABNORMAL LOW (ref 36.0–46.0)
Hemoglobin: 9.4 g/dL — ABNORMAL LOW (ref 12.0–15.0)
MCH: 26.4 pg (ref 26.0–34.0)
MCHC: 29.3 g/dL — ABNORMAL LOW (ref 30.0–36.0)
MCV: 90.2 fL (ref 80.0–100.0)
Platelets: 413 10*3/uL — ABNORMAL HIGH (ref 150–400)
RBC: 3.56 MIL/uL — ABNORMAL LOW (ref 3.87–5.11)
RDW: 19.4 % — ABNORMAL HIGH (ref 11.5–15.5)
WBC: 9.6 10*3/uL (ref 4.0–10.5)
nRBC: 0.2 % (ref 0.0–0.2)

## 2019-03-08 MED ORDER — APIXABAN 5 MG PO TABS: 5.0000 mg | ORAL_TABLET | Freq: Two times a day (BID) | ORAL | 0 refills | Status: DC

## 2019-03-08 MED ORDER — METOPROLOL TARTRATE 25 MG PO TABS: 12.5000 mg | ORAL_TABLET | Freq: Two times a day (BID) | ORAL | 0 refills | Status: DC

## 2019-03-08 MED FILL — METOPROLOL TARTRATE 25 MG T: 25 | 90 days supply | Qty: 90 | Fill #0

## 2019-03-08 MED FILL — ELIQUIS 5 MG TABLET: 5 | 30 days supply | Qty: 60 | Fill #0

## 2019-03-08 NOTE — Discharge Instructions (Signed)
[name] White, you were admitted for a possible GI bleed.  Since your hemoglobin stayed about the same and your vital signs are reassuring, the GI doctors want to do an endoscopy and colonoscopy outpatient rather than in the hospital.  We also changed your blood thinner from Xarelto to Eliquis, since Eliquis can cause less GI bleeding.  Please take Eliquis twice daily every day and stop Xarelto.  It would also be helpful to stop Excedrin since this contains aspirin which can cause further bleeding.  Also, we are reducing your metoprolol to a half tablet twice per day.  This will hopefully help you feel less dizzy.  Please follow-up with your PCP and with gastroenterology.  It would also be helpful for you to get a sleep study since your oxygen would get very low during the night even when you are on the nasal cannula.  Information on my medicine - ELIQUIS (apixaban)  This medication education was reviewed with me or my healthcare representative as part of my discharge preparation.  The pharmacist that spoke with me during my hospital stay was:  Lajean Saver, [place]  Why was Eliquis prescribed for you? Eliquis was prescribed for you to reduce the risk of forming blood clots that can cause a stroke if you have a medical condition called atrial fibrillation (a type of irregular heartbeat) OR to reduce the risk of a blood clots forming after orthopedic surgery.  What do You need to know about Eliquis ? Take your Eliquis TWICE DAILY - one tablet in the morning and one tablet in the evening with or without food.  It would be best to take the doses about the same time each day.  If you have difficulty swallowing the tablet whole please discuss with your pharmacist how to take the medication safely.  Take Eliquis exactly as prescribed by your doctor and DO NOT stop taking Eliquis without talking to the doctor who prescribed the medication.  Stopping may increase your risk of developing a new clot or stroke.   Refill your prescription before you run out.  After discharge, you should have regular check-up appointments with your healthcare provider that is prescribing your Eliquis.  In the future your dose may need to be changed if your kidney function or weight changes by a significant amount or as you get older.  What do you do if you miss a dose? If you miss a dose, take it as soon as you remember on the same day and resume taking twice daily.  Do not take more than one dose of ELIQUIS at the same time.  Important Safety Information A possible side effect of Eliquis is bleeding. You should call your healthcare provider right away if you experience any of the following: ? Bleeding from an injury or your nose that does not stop. ? Unusual colored urine (red or dark brown) or unusual colored stools (red or black). ? Unusual bruising for unknown reasons. ? A serious fall or if you hit your head (even if there is no bleeding).  Some medicines may interact with Eliquis and might increase your risk of bleeding or clotting while on Eliquis. To help avoid this, consult your healthcare provider or pharmacist prior to using any new prescription or non-prescription medications, including herbals, vitamins, non-steroidal anti-inflammatory drugs (NSAIDs) and supplements.  This website has more information on Eliquis (apixaban): [email or web address].

## 2019-03-08 NOTE — Care Management Important Message (Signed)
Important Message  Patient Details  Name: [name] White MRN: [id number] Date of Birth: Dec 10, 1955   Medicare Important Message Given:  Yes     Shelda Altes 03/08/2019, 1:36 PM

## 2019-03-08 NOTE — TOC Initial Note (Signed)
Transition of Care [facility]) - Initial/Assessment Note    Patient Details  Name: [name] White MRN: [id number] Date of Birth: 16-May-1956  Transition of Care [facility]) CM/SW Contact:    Bethena Roys, RN Phone Number: 03/08/2019, 12:09 PM  Clinical Narrative:  Pt presented for lower GI Bleed. PTA from home- pt lives at the [facility]. Patient states only family support is that of her sister. Patient wanted to add a personal care aide to services. [facility] is following- call placed to [facility] and stated that [name] CSW is assisting patient with personal care services and transportation. Patient is aware. Patient in need of cab home- no family available to assist with transportation. Patient states she needs DME 02 delivered to her room-call placed to [facility] with [facility] and DME will be delivered before transition home. CM did notify MD to place orders for [place] and F2F. No further needs from CM at this time.                Expected Discharge Plan: [place] Barriers to Discharge: No Barriers Identified   Patient Goals and CMS Choice Patient states their goals for this hospitalization and ongoing recovery are:: "to feel better and get some personal care services in the home"   Choice offered to / list presented to : NA  Expected Discharge Plan and Services Expected Discharge Plan: [place] In-house Referral: NA Discharge Planning Services: CM Consult, Follow-up appt scheduled Post Acute Care Choice: Home Health, Resumption of Svcs/PTA Provider Living arrangements for the past 2 months: Hotel/Motel Expected Discharge Date: 03/08/19                         HH Arranged: RN, Disease Management, PT, OT, Nurse's Aide [place] Agency: [place] ([facility]) Date HH Agency Contacted: 03/08/19 Time HH Agency Contacted: 1207 Representative spoke with at [place]: [place]  Prior Living Arrangements/Services Living arrangements for  the past 2 months: Hotel/Motel Lives with:: Self Patient language and need for interpreter reviewed:: Yes Do you feel safe going back to the place where you live?: Yes      Need for Family Participation in Patient Care: Yes (Comment) Care giver support system in place?: Yes (comment) Current home services: DME, Home OT, Home PT, Home RN(Pt has oxygen, nebulizer machine, rolling walker) Criminal Activity/Legal Involvement Pertinent to Current Situation/Hospitalization: No - Comment as needed  Activities of Daily Living      Permission Sought/Granted Permission sought to share information with : Facility Art therapist granted to share information with : Yes, Verbal Permission Granted     Permission granted to share info w AGENCY: [facility]        Emotional Assessment Appearance:: Appears stated age Attitude/Demeanor/Rapport: Engaged Affect (typically observed): Accepting Orientation: : Oriented to Situation, Oriented to  Time, Oriented to Place, Oriented to Self Alcohol / Substance Use: Not Applicable Psych Involvement: No (comment)  Admission diagnosis:  Gastrointestinal hemorrhage, unspecified gastrointestinal hemorrhage type [K92.2] Active bleeding [Z78.9] Patient Active Problem List   Diagnosis Date Noted  . Acquired thrombophilia ([place]) due to A-Fib 03/07/2019  . BRBPR (bright red blood per rectum) 03/06/2019  . Heart failure with preserved ejection fraction ([place]) 03/06/2019  . Active bleeding 03/06/2019  . Obesity hypoventilation syndrome ([place]) 03/06/2019  . Abdominal wall hematoma 03/06/2019  . Lower GI bleed   . High risk social situation   . Urge incontinence  01/27/2019  . Homelessness 12/30/2018  . Reactive airway disease 09/10/2018  . Sleep apnea 07/05/2018  . Chronic respiratory failure with hypoxia ([place]) 07/05/2018  . Hematoma 11/27/2017  . Severe episode of recurrent major depressive disorder, without psychotic features ([place])    . MDD (major depressive disorder), recurrent episode, severe ([place]) 09/07/2015  . Atrial flutter ([place]) 07/29/2015  . Asthma 11/12/2014  . History of hypertension 11/05/2014  . Paroxysmal atrial fibrillation ([place]) 11/03/2014  . Chronic anticoagulation 11/03/2014  . Major depressive disorder 11/03/2014  . History of pulmonary embolus (PE) 11/03/2014  . MDD (major depressive disorder), recurrent severe, without psychosis ([place]) 03/05/2014  . Vocal cord dysfunction 11/12/2013  . Mixed headache 01/06/2012  . Hot flashes 04/22/2011  . OVERACTIVE BLADDER 04/18/2008  . Osteoarthritis of both knees 04/18/2008  . Osteoarthritis involving multiple joints on both sides of body 09/14/2007  . Morbid obesity ([place]) 08/24/2006  . RESTLESS LEGS SYNDROME 08/24/2006  . HYPERTENSION, BENIGN SYSTEMIC 08/24/2006  . RHINITIS, ALLERGIC 08/24/2006  . GASTROESOPHAGEAL REFLUX, NO ESOPHAGITIS 08/24/2006   PCP:  [name], MD Pharmacy:   [facility] [place], [place] [address] [place] [address] Phone: [phone] Fax: [phone]     Social Determinants of Health ([place]) Interventions    Readmission Risk Interventions Readmission Risk Prevention Plan 03/08/2019  Transportation Screening Complete  Medication Review ([place]) Complete  PCP or Specialist appointment within 3-5 days of discharge Complete  HRI or [place] Complete  SW Recovery Care/Counseling Consult Complete  [place] Not Applicable  Some recent data might be hidden

## 2019-03-08 NOTE — Progress Notes (Signed)
Patient called to report feeling lightheaded, dizzy and nauseated. NSR 82, BP 129/69 MAP 84. Patient encouraged to eat breakfast that just arrived and see if she feels better as she hasn't eaten anything today. Agreed with plan. Will walk after eating breakfast and plan to go home.

## 2019-03-08 NOTE — TOC Transition Note (Signed)
Transition of Care [facility]) - CM/SW Discharge Note   Patient Details  Name: [name] White MRN: [id number] Date of Birth: January 26, 1956  Transition of [facility]) CM/SW Contact:  Bethena Roys, RN Phone Number: 03/08/2019, 12:14 PM   Clinical Narrative:  Patient has Cab Voucher in shadow chart- expected transition home today.    Final next level of care: Home w Home Health Services Barriers to Discharge: No Barriers Identified   Patient Goals and CMS Choice Patient states their goals for this hospitalization and ongoing recovery are:: "to feel better and get some personal care services in the home"   Choice offered to / list presented to : NA   Discharge Plan and Services In-house Referral: NA Discharge Planning Services: CM Consult, Follow-up appt scheduled Post Acute Care Choice: Home Health, Resumption of Svcs/PTA Provider              HH Arranged: RN, Disease Management, PT, OT, Nurse's Aide [place] Agency: [place] ([facility]) Date HH Agency Contacted: 03/08/19 Time [place]: 1207 Representative spoke with at [place]: [place] ([place]) Interventions     Readmission Risk Interventions Readmission Risk Prevention Plan 03/08/2019  Transportation Screening Complete  Medication Review [place]) Complete  PCP or Specialist appointment within 3-5 days of discharge Complete  HRI or [place] Complete  SW Recovery Care/Counseling Consult Complete  [place] Not Applicable  Some recent data might be hidden

## 2019-03-09 ENCOUNTER — Other Ambulatory Visit: Payer: Self-pay | Admitting: Family Medicine

## 2019-03-10 DIAGNOSIS — M159 Polyosteoarthritis, unspecified: Secondary | ICD-10-CM | POA: Diagnosis not present

## 2019-03-10 DIAGNOSIS — J9611 Chronic respiratory failure with hypoxia: Secondary | ICD-10-CM | POA: Diagnosis not present

## 2019-03-10 DIAGNOSIS — G8929 Other chronic pain: Secondary | ICD-10-CM | POA: Diagnosis not present

## 2019-03-10 DIAGNOSIS — I480 Paroxysmal atrial fibrillation: Secondary | ICD-10-CM | POA: Diagnosis not present

## 2019-03-10 DIAGNOSIS — F419 Anxiety disorder, unspecified: Secondary | ICD-10-CM | POA: Diagnosis not present

## 2019-03-10 DIAGNOSIS — R45851 Suicidal ideations: Secondary | ICD-10-CM | POA: Diagnosis not present

## 2019-03-10 DIAGNOSIS — J449 Chronic obstructive pulmonary disease, unspecified: Secondary | ICD-10-CM | POA: Diagnosis not present

## 2019-03-10 DIAGNOSIS — F329 Major depressive disorder, single episode, unspecified: Secondary | ICD-10-CM | POA: Diagnosis not present

## 2019-03-10 DIAGNOSIS — I10 Essential (primary) hypertension: Secondary | ICD-10-CM | POA: Diagnosis not present

## 2019-03-10 DIAGNOSIS — M545 Low back pain: Secondary | ICD-10-CM | POA: Diagnosis not present

## 2019-03-11 ENCOUNTER — Other Ambulatory Visit: Payer: Self-pay | Admitting: *Deleted

## 2019-03-11 NOTE — Patient Outreach (Signed)
[facility]) Care Management  03/11/2019  [name] HERMANNS 25-Aug-1955 [id number]   CSW made an attempt to try and contact patient today to follow-up regarding social work services and resources, as well as to ensure that patient has home health services and durable medical equipment arranged for home use; however, patient was not available at the time of CSW's call.  CSW was unable to leave a HIPAA compliant message on voicemail for patient, receiving an automated recording indicating that patient's mailbox is full and unable to accept new messages at this time. CSW noted that patient was discharged from [facility] on Friday, March 08, 2019, returning to [place] (current place of residence) to live independently.  It was documented in patient's EMR (Electronic Medical Record) in Epic, that patient was discharged home with home health nursing, physical therapy, occupational therapy and an aide.  CSW will continue to assist patient with re-applying for PCS ([place]) through [facility], as patient was denied services initially.  CSW will make a second attempt to outreach to patient again within the next 3-4 business days, if a return call is not received from patient in the meantime.  [name], BSW, MSW, LCSW  Licensed Education officer, [facility]  Mailing [place] [address], [place], [place] [address] Physical Address-[address], [place], [place] [address] Toll Free Main # [phone] Fax # [phone] Cell # [phone]  Office # [phone] [name]

## 2019-03-12 DIAGNOSIS — F331 Major depressive disorder, recurrent, moderate: Secondary | ICD-10-CM | POA: Diagnosis not present

## 2019-03-13 ENCOUNTER — Other Ambulatory Visit: Payer: Self-pay

## 2019-03-13 ENCOUNTER — Encounter (HOSPITAL_COMMUNITY): Payer: Self-pay

## 2019-03-13 ENCOUNTER — Emergency Department (HOSPITAL_COMMUNITY)
Admission: EM | Admit: 2019-03-13 | Discharge: 2019-03-13 | Disposition: A | Discharge status: Home / Self Care | Payer: Medicare HMO | Source: Home / Self Care | Attending: Emergency Medicine | Admitting: Emergency Medicine

## 2019-03-13 ENCOUNTER — Inpatient Hospital Stay: Payer: Medicare HMO | Admitting: Family Medicine

## 2019-03-13 DIAGNOSIS — Z20828 Contact with and (suspected) exposure to other viral communicable diseases: Secondary | ICD-10-CM | POA: Diagnosis not present

## 2019-03-13 DIAGNOSIS — E876 Hypokalemia: Secondary | ICD-10-CM | POA: Diagnosis not present

## 2019-03-13 DIAGNOSIS — J9611 Chronic respiratory failure with hypoxia: Secondary | ICD-10-CM | POA: Diagnosis not present

## 2019-03-13 DIAGNOSIS — R069 Unspecified abnormalities of breathing: Secondary | ICD-10-CM | POA: Diagnosis not present

## 2019-03-13 DIAGNOSIS — R0689 Other abnormalities of breathing: Secondary | ICD-10-CM | POA: Diagnosis not present

## 2019-03-13 DIAGNOSIS — J441 Chronic obstructive pulmonary disease with (acute) exacerbation: Secondary | ICD-10-CM | POA: Diagnosis not present

## 2019-03-13 DIAGNOSIS — J9621 Acute and chronic respiratory failure with hypoxia: Secondary | ICD-10-CM | POA: Diagnosis not present

## 2019-03-13 DIAGNOSIS — Z66 Do not resuscitate: Secondary | ICD-10-CM | POA: Diagnosis not present

## 2019-03-13 DIAGNOSIS — I4892 Unspecified atrial flutter: Secondary | ICD-10-CM | POA: Diagnosis not present

## 2019-03-13 DIAGNOSIS — Z87891 Personal history of nicotine dependence: Secondary | ICD-10-CM | POA: Insufficient documentation

## 2019-03-13 DIAGNOSIS — Z79899 Other long term (current) drug therapy: Secondary | ICD-10-CM | POA: Insufficient documentation

## 2019-03-13 DIAGNOSIS — R0600 Dyspnea, unspecified: Secondary | ICD-10-CM | POA: Diagnosis not present

## 2019-03-13 DIAGNOSIS — W19XXXA Unspecified fall, initial encounter: Secondary | ICD-10-CM | POA: Diagnosis not present

## 2019-03-13 DIAGNOSIS — Z8679 Personal history of other diseases of the circulatory system: Secondary | ICD-10-CM | POA: Diagnosis not present

## 2019-03-13 DIAGNOSIS — M2550 Pain in unspecified joint: Secondary | ICD-10-CM | POA: Diagnosis not present

## 2019-03-13 DIAGNOSIS — Z803 Family history of malignant neoplasm of breast: Secondary | ICD-10-CM | POA: Diagnosis not present

## 2019-03-13 DIAGNOSIS — Z7901 Long term (current) use of anticoagulants: Secondary | ICD-10-CM | POA: Insufficient documentation

## 2019-03-13 DIAGNOSIS — R062 Wheezing: Secondary | ICD-10-CM | POA: Diagnosis not present

## 2019-03-13 DIAGNOSIS — R0789 Other chest pain: Secondary | ICD-10-CM | POA: Diagnosis not present

## 2019-03-13 DIAGNOSIS — G2581 Restless legs syndrome: Secondary | ICD-10-CM | POA: Diagnosis present

## 2019-03-13 DIAGNOSIS — M159 Polyosteoarthritis, unspecified: Secondary | ICD-10-CM | POA: Diagnosis present

## 2019-03-13 DIAGNOSIS — J9600 Acute respiratory failure, unspecified whether with hypoxia or hypercapnia: Secondary | ICD-10-CM | POA: Diagnosis not present

## 2019-03-13 DIAGNOSIS — Z8249 Family history of ischemic heart disease and other diseases of the circulatory system: Secondary | ICD-10-CM | POA: Diagnosis not present

## 2019-03-13 DIAGNOSIS — Z23 Encounter for immunization: Secondary | ICD-10-CM | POA: Diagnosis not present

## 2019-03-13 DIAGNOSIS — I5033 Acute on chronic diastolic (congestive) heart failure: Secondary | ICD-10-CM | POA: Diagnosis not present

## 2019-03-13 DIAGNOSIS — G2589 Other specified extrapyramidal and movement disorders: Secondary | ICD-10-CM | POA: Diagnosis not present

## 2019-03-13 DIAGNOSIS — E662 Morbid (severe) obesity with alveolar hypoventilation: Secondary | ICD-10-CM | POA: Diagnosis not present

## 2019-03-13 DIAGNOSIS — R58 Hemorrhage, not elsewhere classified: Secondary | ICD-10-CM | POA: Diagnosis not present

## 2019-03-13 DIAGNOSIS — J449 Chronic obstructive pulmonary disease, unspecified: Secondary | ICD-10-CM | POA: Insufficient documentation

## 2019-03-13 DIAGNOSIS — K921 Melena: Secondary | ICD-10-CM | POA: Diagnosis not present

## 2019-03-13 DIAGNOSIS — Z825 Family history of asthma and other chronic lower respiratory diseases: Secondary | ICD-10-CM | POA: Diagnosis not present

## 2019-03-13 DIAGNOSIS — I482 Chronic atrial fibrillation, unspecified: Secondary | ICD-10-CM | POA: Diagnosis not present

## 2019-03-13 DIAGNOSIS — Z86711 Personal history of pulmonary embolism: Secondary | ICD-10-CM | POA: Diagnosis not present

## 2019-03-13 DIAGNOSIS — J9811 Atelectasis: Secondary | ICD-10-CM | POA: Diagnosis not present

## 2019-03-13 DIAGNOSIS — Z8261 Family history of arthritis: Secondary | ICD-10-CM | POA: Diagnosis not present

## 2019-03-13 DIAGNOSIS — T148XXA Other injury of unspecified body region, initial encounter: Secondary | ICD-10-CM | POA: Diagnosis not present

## 2019-03-13 DIAGNOSIS — R0602 Shortness of breath: Secondary | ICD-10-CM | POA: Diagnosis not present

## 2019-03-13 DIAGNOSIS — F419 Anxiety disorder, unspecified: Secondary | ICD-10-CM | POA: Diagnosis present

## 2019-03-13 DIAGNOSIS — G4730 Sleep apnea, unspecified: Secondary | ICD-10-CM | POA: Diagnosis not present

## 2019-03-13 DIAGNOSIS — R0902 Hypoxemia: Secondary | ICD-10-CM | POA: Diagnosis not present

## 2019-03-13 DIAGNOSIS — I509 Heart failure, unspecified: Secondary | ICD-10-CM | POA: Diagnosis not present

## 2019-03-13 DIAGNOSIS — R000 Tachycardia, unspecified: Secondary | ICD-10-CM | POA: Diagnosis not present

## 2019-03-13 DIAGNOSIS — R079 Chest pain, unspecified: Secondary | ICD-10-CM | POA: Diagnosis not present

## 2019-03-13 DIAGNOSIS — M170 Bilateral primary osteoarthritis of knee: Secondary | ICD-10-CM | POA: Diagnosis not present

## 2019-03-13 DIAGNOSIS — K625 Hemorrhage of anus and rectum: Secondary | ICD-10-CM

## 2019-03-13 DIAGNOSIS — I10 Essential (primary) hypertension: Secondary | ICD-10-CM | POA: Insufficient documentation

## 2019-03-13 DIAGNOSIS — K219 Gastro-esophageal reflux disease without esophagitis: Secondary | ICD-10-CM | POA: Diagnosis present

## 2019-03-13 DIAGNOSIS — G8929 Other chronic pain: Secondary | ICD-10-CM | POA: Diagnosis present

## 2019-03-13 DIAGNOSIS — I480 Paroxysmal atrial fibrillation: Secondary | ICD-10-CM | POA: Diagnosis present

## 2019-03-13 DIAGNOSIS — Z7951 Long term (current) use of inhaled steroids: Secondary | ICD-10-CM | POA: Diagnosis not present

## 2019-03-13 DIAGNOSIS — I110 Hypertensive heart disease with heart failure: Secondary | ICD-10-CM | POA: Diagnosis present

## 2019-03-13 DIAGNOSIS — M545 Low back pain: Secondary | ICD-10-CM | POA: Diagnosis present

## 2019-03-13 DIAGNOSIS — Z7401 Bed confinement status: Secondary | ICD-10-CM | POA: Diagnosis not present

## 2019-03-13 DIAGNOSIS — R739 Hyperglycemia, unspecified: Secondary | ICD-10-CM | POA: Diagnosis not present

## 2019-03-13 DIAGNOSIS — I517 Cardiomegaly: Secondary | ICD-10-CM | POA: Diagnosis not present

## 2019-03-13 LAB — TYPE AND SCREEN
ABO/RH(D): A POS
Antibody Screen: NEGATIVE

## 2019-03-13 LAB — CBC WITH DIFFERENTIAL/PLATELET
Abs Immature Granulocytes: 0.07 10*3/uL (ref 0.00–0.07)
Basophils Absolute: 0 10*3/uL (ref 0.0–0.1)
Basophils Relative: 0 %
Eosinophils Absolute: 0.5 10*3/uL (ref 0.0–0.5)
Eosinophils Relative: 4 %
HCT: 36 % (ref 36.0–46.0)
Hemoglobin: 10.6 g/dL — ABNORMAL LOW (ref 12.0–15.0)
Immature Granulocytes: 1 %
Lymphocytes Relative: 13 %
Lymphs Abs: 1.5 10*3/uL (ref 0.7–4.0)
MCH: 27.1 pg (ref 26.0–34.0)
MCHC: 29.4 g/dL — ABNORMAL LOW (ref 30.0–36.0)
MCV: 92.1 fL (ref 80.0–100.0)
Monocytes Absolute: 0.7 10*3/uL (ref 0.1–1.0)
Monocytes Relative: 6 %
Neutro Abs: 9.2 10*3/uL — ABNORMAL HIGH (ref 1.7–7.7)
Neutrophils Relative %: 76 %
Platelets: 571 10*3/uL — ABNORMAL HIGH (ref 150–400)
RBC: 3.91 MIL/uL (ref 3.87–5.11)
RDW: 20.7 % — ABNORMAL HIGH (ref 11.5–15.5)
WBC: 11.9 10*3/uL — ABNORMAL HIGH (ref 4.0–10.5)
nRBC: 0.3 % — ABNORMAL HIGH (ref 0.0–0.2)

## 2019-03-13 LAB — COMPREHENSIVE METABOLIC PANEL
ALT: 26 U/L (ref 0–44)
AST: 30 U/L (ref 15–41)
Albumin: 3.6 g/dL (ref 3.5–5.0)
Alkaline Phosphatase: 86 U/L (ref 38–126)
Anion gap: 10 (ref 5–15)
BUN: 12 mg/dL (ref 8–23)
CO2: 27 mmol/L (ref 22–32)
Calcium: 9.1 mg/dL (ref 8.9–10.3)
Chloride: 105 mmol/L (ref 98–111)
Creatinine, Ser: 0.58 mg/dL (ref 0.44–1.00)
GFR calc Af Amer: >60 mL/min (ref >60)
GFR calc non Af Amer: >60 mL/min (ref >60)
Glucose, Bld: 108 mg/dL — ABNORMAL HIGH (ref 70–99)
Potassium: 3.4 mmol/L — ABNORMAL LOW (ref 3.5–5.1)
Sodium: 142 mmol/L (ref 135–145)
Total Bilirubin: 0.6 mg/dL (ref 0.3–1.2)
Total Protein: 8 g/dL (ref 6.5–8.1)

## 2019-03-13 LAB — ABO/RH: ABO/RH(D): A POS

## 2019-03-13 NOTE — ED Provider Notes (Signed)
[facility] DEPT Provider Note   CSN: [id number] Arrival date & time: 03/13/19  0041     History   Chief Complaint Chief Complaint  Patient presents with  . Rectal Bleeding    HPI [name] White is a 63 y.o. female.     Patient presents to the emergency department for evaluation of rectal bleeding.  Patient reports that she has been passing more blood in her stools.  She was hospitalized from 9/8-9/11 at [facility] for rectal bleeding and abdominal wall hematoma.  Patient was on Xarelto at that time because of a history of blood clot and atrial fibrillation.  She did well during hospitalization, did not require transfusion.  She was switched to Eliquis during the hospitalization and is on Eliquis twice daily currently.  She is not having any abdominal pain.  She has not had any fever.  No chest pain, shortness of breath, palpitations.     Past Medical History:  Diagnosis Date  . Abdominal wall hematoma 03/06/2019  . Acute bronchitis 08/29/2018  . Acute on chronic respiratory failure with hypoxia ([place]) 08/29/2018  . Anxiety   . Asthma   . Chronic lower back pain   . COPD (chronic obstructive pulmonary disease) ([place])   . Depression   . Exposure to Covid-19 Virus 11/02/2018  . Fall 02/2019  . Family history of anesthesia complication    "daughter; causes her to pass out afterwards"  . GERD (gastroesophageal reflux disease)   . History of atrial flutter 06/26/2015  . History of pulmonary embolus (PE) 11/03/2014  . Hypertension   . Hyperthyroidism   . Left medial tibial stress syndrome 12/26/2017  . Lower GI bleed   . Migraine    "monthly" (12/28/2013)  . Non-traumatic rhabdomyolysis 11/03/2014  . Obesity hypoventilation syndrome ([place]) 03/06/2019  . Osteoarthritis    "both knees; back of my neck; right pelvic bone" (12/28/2013)  . Paroxysmal A-fib ([place])   . Pulmonary embolism ([place]) 12/28/2013   "2 clots in each lung"    Patient Active Problem List   Diagnosis Date Noted  . Acquired thrombophilia ([place]) due to A-Fib 03/07/2019  . BRBPR (bright red blood per rectum) 03/06/2019  . Heart failure with preserved ejection fraction ([place]) 03/06/2019  . Active bleeding 03/06/2019  . Obesity hypoventilation syndrome ([place]) 03/06/2019  . Abdominal wall hematoma 03/06/2019  . Lower GI bleed   . High risk social situation   . Urge incontinence 01/27/2019  . Homelessness 12/30/2018  . Reactive airway disease 09/10/2018  . Sleep apnea 07/05/2018  . Chronic respiratory failure with hypoxia ([place]) 07/05/2018  . Hematoma 11/27/2017  . Severe episode of recurrent major depressive disorder, without psychotic features ([place])   . MDD (major depressive disorder), recurrent episode, severe ([place]) 09/07/2015  . Atrial flutter ([place]) 07/29/2015  . Asthma 11/12/2014  . History of hypertension 11/05/2014  . Paroxysmal atrial fibrillation ([place]) 11/03/2014  . Chronic anticoagulation 11/03/2014  . Major depressive disorder 11/03/2014  . History of pulmonary embolus (PE) 11/03/2014  . MDD (major depressive disorder), recurrent severe, without psychosis ([place]) 03/05/2014  . Vocal cord dysfunction 11/12/2013  . Mixed headache 01/06/2012  . Hot flashes 04/22/2011  . OVERACTIVE BLADDER 04/18/2008  . Osteoarthritis of both knees 04/18/2008  . Osteoarthritis involving multiple joints on both sides of body 09/14/2007  . Morbid obesity ([place]) 08/24/2006  . RESTLESS LEGS SYNDROME 08/24/2006  . HYPERTENSION, BENIGN SYSTEMIC 08/24/2006  . RHINITIS, ALLERGIC 08/24/2006  . GASTROESOPHAGEAL REFLUX, NO ESOPHAGITIS 08/24/2006  Past Surgical History:  Procedure Laterality Date  . ABDOMINAL HYSTERECTOMY    . APPENDECTOMY    . BREAST CYST EXCISION Right   . DILATION AND CURETTAGE OF UTERUS    . ELECTROPHYSIOLOGIC STUDY N/A 05/27/2015   Procedure: A-Flutter;  Surgeon: Evans Lance, MD;  Location: [facility];  Service: Cardiovascular;  Laterality: N/A;  .  EXCISIONAL HEMORRHOIDECTOMY    . NASAL SINUS SURGERY  2007  . THYROIDECTOMY, PARTIAL Right 2005  . TUBAL LIGATION    . WISDOM TOOTH EXTRACTION       OB History   No obstetric history on file.      Home Medications    Prior to Admission medications   Medication Sig Start Date End Date Taking? Authorizing Provider  acetaminophen (TYLENOL) 325 MG tablet Take 2 tablets (650 mg total) by mouth every 6 (six) hours as needed for mild pain. 09/11/18  Yes Georgette Shell, MD  albuterol (PROVENTIL) (2.5 MG/3ML) 0.083% nebulizer solution Take 3 mLs (2.5 mg total) by nebulization every 6 (six) hours as needed for wheezing or shortness of breath. 07/09/18  Yes Collene Gobble, MD  apixaban (ELIQUIS) 5 MG TABS tablet Take 1 tablet (5 mg total) by mouth 2 (two) times daily. 03/08/19  Yes Winfrey, Alcario Drought, MD  azelastine (OPTIVAR) 0.05 % ophthalmic solution Place 1 drop into both eyes 2 (two) times daily as needed (itchy eyes).  08/02/18  Yes [provider]  cyclobenzaprine (FLEXERIL) 10 MG tablet TAKE 1 TABLET BY MOUTH EVERY 8 HOURS AS NEEDED FOR PAIN Patient taking differently: Take 10 mg by mouth 3 (three) times daily as needed for muscle spasms.  03/05/19  Yes [name], MD  cycloSPORINE (RESTASIS) 0.05 % ophthalmic emulsion Place 1 drop into both eyes 2 (two) times daily.   Yes [provider]  diclofenac sodium (VOLTAREN) 1 % GEL APPLY 2 GRAMS EXTERNALLY TO THE AFFECTED AREA FOUR TIMES DAILY Patient taking differently: Apply 2 g topically 4 (four) times daily as needed (pain).  07/31/18  Yes [name], MD  diltiazem (CARDIZEM CD) 120 MG 24 hr capsule Take 1 capsule (120 mg total) by mouth daily. Please keep upcoming appt in January before anymore refills. Final Attempt Patient taking differently: Take 120 mg by mouth at bedtime. Please keep upcoming appt in January before anymore refills. Final Attempt 07/04/18  Yes Seiler, Cecil Cobbs, NP  famotidine (PEPCID) 20 MG tablet  Take 1 tablet (20 mg total) by mouth daily. 12/22/18  Yes [name], MD  flecainide (TAMBOCOR) 100 MG tablet TAKE 1 TABLET BY MOUTH 2 TIMES A DAY. PLEASE KEEP UPCOMING APPOINTMENT IN JANUARY BEFORE ANYMORE REFILLS. Patient taking differently: Take 100 mg by mouth 2 (two) times daily.  12/03/18  Yes Seiler, Amber K, NP  fluticasone (FLONASE) 50 MCG/ACT nasal spray Place 2 sprays into both nostrils daily.  10/29/18  Yes [provider]  hydrocortisone (ANUSOL-HC) 25 MG suppository Place 1 suppository (25 mg total) rectally 2 (two) times daily. Patient taking differently: Place 25 mg rectally 2 (two) times daily as needed for hemorrhoids or anal itching.  08/20/18  Yes [place] Donnamarie Poag, DO  hydrOXYzine (VISTARIL) 25 MG capsule Take 25 mg by mouth 2 (two) times daily as needed for anxiety. 11/26/18  Yes [provider]  metoprolol tartrate (LOPRESSOR) 25 MG tablet TAKE 1 TABLET BY MOUTH TWICE DAILY Patient taking differently: Take 25 mg by mouth 2 (two) times daily.  03/11/19  Yes [name],  MD  umeclidinium bromide (INCRUSE ELLIPTA) 62.5 MCG/INH AEPB Inhale 1 puff into the lungs daily. 03/01/19  Yes Winfrey, Alcario Drought, MD  venlafaxine XR (EFFEXOR-XR) 150 MG 24 hr capsule Take 150 mg by mouth daily with breakfast.   Yes [provider]  loratadine (CLARITIN) 10 MG tablet TAKE 1 TABLET(10 MG) BY MOUTH DAILY Patient taking differently: Take 10 mg by mouth daily.  03/05/19   [name], MD  MAGNESIUM-OXIDE 400 (241.3 Mg) MG tablet TAKE 1 TABLET BY MOUTH TWICE DAILY Patient not taking: No sig reported 12/07/18   [name], MD  OXYGEN 2L at bedtime and prn exertion    [provider]  pantoprazole (PROTONIX) 40 MG tablet TAKE 1 TABLET BY MOUTH TWICE DAILY Patient not taking: No sig reported 12/07/18   [name], MD    Family History Family History  Problem Relation Age of Onset  . Osteoarthritis Mother   . Asthma Mother   . Heart failure Mother   .  Breast cancer Daughter     Social History Social History   Tobacco Use  . Smoking status: Former Smoker    Packs/day: 0.25    Years: 20.00    Pack years: 5.00    Types: Cigarettes    Quit date: 07/17/1999    Years since quitting: 19.6  . Smokeless tobacco: Never Used  Substance Use Topics  . Alcohol use: Not Currently    Comment: "drank some in my 30's"  . Drug use: No     Allergies   Caffeine, Hydralazine hcl, Hydrocodone, Ciprofloxacin, Erythromycin, Lisinopril, Oxycodone, and Sulfamethoxazole-trimethoprim   Review of Systems Review of Systems  Gastrointestinal: Positive for blood in stool.  All other systems reviewed and are negative.    Physical Exam Updated Vital Signs BP 127/81 (BP Location: Left Wrist)   Pulse (!) 106   Temp 97.8 F (36.6 C) (Oral)   Resp 20   Ht 5\' 5"  (1.651 m)   Wt (!) 161.5 kg   SpO2 100%   BMI 59.24 kg/m   Physical Exam Vitals signs and nursing note reviewed.  Constitutional:      General: She is not in acute distress.    Appearance: Normal appearance. She is well-developed.  HENT:     Head: Normocephalic and atraumatic.     Right Ear: Hearing normal.     Left Ear: Hearing normal.     Nose: Nose normal.  Eyes:     Conjunctiva/sclera: Conjunctivae normal.     Pupils: Pupils are equal, round, and reactive to light.  Neck:     Musculoskeletal: Normal range of motion and neck supple.  Cardiovascular:     Rate and Rhythm: Regular rhythm.     Heart sounds: S1 normal and S2 normal. No murmur. No friction rub. No gallop.   Pulmonary:     Effort: Pulmonary effort is normal. No respiratory distress.     Breath sounds: Normal breath sounds.  Chest:     Chest wall: No tenderness.  Abdominal:     General: Bowel sounds are normal.     Palpations: Abdomen is soft.     Tenderness: There is no abdominal tenderness. There is no guarding or rebound. Negative signs include [name]'s sign and McBurney's sign.     Hernia: No hernia is  present.  Musculoskeletal: Normal range of motion.  Skin:    General: Skin is warm and dry.     Findings: No rash.     Comments: Ecchymotic area over  upper abdomen  Neurological:     Mental Status: She is alert and oriented to person, place, and time.     GCS: GCS eye subscore is 4. GCS verbal subscore is 5. GCS motor subscore is 6.     Cranial Nerves: No cranial nerve deficit.     Sensory: No sensory deficit.     Coordination: Coordination normal.  Psychiatric:        Speech: Speech normal.        Behavior: Behavior normal.        Thought Content: Thought content normal.      ED Treatments / Results  Labs (all labs ordered are listed, but only abnormal results are displayed) Labs Reviewed  CBC WITH DIFFERENTIAL/PLATELET - Abnormal; Notable for the following components:      Result Value   WBC 11.9 (*)    Hemoglobin 10.6 (*)    MCHC 29.4 (*)    RDW 20.7 (*)    Platelets 571 (*)    nRBC 0.3 (*)    Neutro Abs 9.2 (*)    All other components within normal limits  COMPREHENSIVE METABOLIC PANEL - Abnormal; Notable for the following components:   Potassium 3.4 (*)    Glucose, Bld 108 (*)    All other components within normal limits  TYPE AND SCREEN    EKG None  Radiology No results found.  Procedures Procedures (including critical care time)  Medications Ordered in ED Medications - No data to display   Initial Impression / Assessment and Plan / ED Course  I have reviewed the triage vital signs and the nursing notes.  Pertinent labs & imaging results that were available during my care of the patient were reviewed by me and considered in my medical decision making (see chart for details).        Patient presents to the emergency department for evaluation of rectal bleeding.  Patient was just hospitalized for same.  She is on chronic anticoagulation because of a history of paroxysmal atrial fibrillation and PEs.  She was switched from Xarelto to Eliquis during  her hospital stay.  Bleeding had stopped at time of discharge but now she has had bleeding again today.  Her vital signs are normal and she appears well.  Her hemoglobin is actually higher than it was during the hospital stay at 10.6 today.  This is very reassuring.  Patient will need long-term anticoagulation but I do feel that it will be safe to stop it for 1 or 2 days and then restart.  Patient counseled that she needs to contact [facility] gastroenterology tomorrow morning.  She was seen by them in the hospital.  She was also counseled to return to the ER for any worsening bleeding.  Final Clinical Impressions(s) / ED Diagnoses   Final diagnoses:  Rectal bleeding    ED Discharge Orders    None       Orpah Greek, MD 03/13/19 [phone]

## 2019-03-13 NOTE — ED Notes (Signed)
Pt was discharged at 0300.  This Probation officer has tried several times to d/c the chart.  Pain assessments reflect documented pain score when patient was actually discharged.

## 2019-03-13 NOTE — ED Triage Notes (Signed)
Per EMS, Pt called EMS due to seeing more blood bright red blood in her stool. [facility]. Pt does take Plavix. Pt on 2L normally, hx of COPD.

## 2019-03-13 NOTE — Discharge Instructions (Addendum)
Do not take your Eliquis for the next 2 days.  After that please restart it again.  Contact [facility] gastro[facility]ology for an outpatient appointment to schedule colonoscopy.  If you have any heavier bleeding, especially if there is chest pain, palpitations, shortness of breath or feeling like you are going to pass out, please return to the ER.

## 2019-03-14 ENCOUNTER — Encounter (HOSPITAL_COMMUNITY): Payer: Self-pay | Admitting: Emergency Medicine

## 2019-03-14 ENCOUNTER — Other Ambulatory Visit: Payer: Self-pay

## 2019-03-14 ENCOUNTER — Inpatient Hospital Stay (HOSPITAL_COMMUNITY): Payer: Medicare HMO

## 2019-03-14 ENCOUNTER — Inpatient Hospital Stay (HOSPITAL_COMMUNITY)
Admission: EM | Admit: 2019-03-14 | Discharge: 2019-03-22 | DRG: 190 | Disposition: A | Discharge status: Skilled Nursing Facility | Payer: Medicare HMO | Attending: Internal Medicine | Admitting: Internal Medicine

## 2019-03-14 ENCOUNTER — Emergency Department (HOSPITAL_COMMUNITY): Payer: Medicare HMO

## 2019-03-14 DIAGNOSIS — Z7952 Long term (current) use of systemic steroids: Secondary | ICD-10-CM

## 2019-03-14 DIAGNOSIS — M545 Low back pain: Secondary | ICD-10-CM | POA: Diagnosis present

## 2019-03-14 DIAGNOSIS — J441 Chronic obstructive pulmonary disease with (acute) exacerbation: Principal | ICD-10-CM

## 2019-03-14 DIAGNOSIS — M159 Polyosteoarthritis, unspecified: Secondary | ICD-10-CM | POA: Diagnosis present

## 2019-03-14 DIAGNOSIS — I480 Paroxysmal atrial fibrillation: Secondary | ICD-10-CM | POA: Diagnosis present

## 2019-03-14 DIAGNOSIS — I10 Essential (primary) hypertension: Secondary | ICD-10-CM

## 2019-03-14 DIAGNOSIS — Z8261 Family history of arthritis: Secondary | ICD-10-CM | POA: Diagnosis not present

## 2019-03-14 DIAGNOSIS — R0602 Shortness of breath: Secondary | ICD-10-CM | POA: Diagnosis not present

## 2019-03-14 DIAGNOSIS — G2581 Restless legs syndrome: Secondary | ICD-10-CM | POA: Diagnosis present

## 2019-03-14 DIAGNOSIS — F329 Major depressive disorder, single episode, unspecified: Secondary | ICD-10-CM | POA: Diagnosis present

## 2019-03-14 DIAGNOSIS — R0600 Dyspnea, unspecified: Secondary | ICD-10-CM

## 2019-03-14 DIAGNOSIS — Z7951 Long term (current) use of inhaled steroids: Secondary | ICD-10-CM | POA: Diagnosis not present

## 2019-03-14 DIAGNOSIS — J9600 Acute respiratory failure, unspecified whether with hypoxia or hypercapnia: Secondary | ICD-10-CM | POA: Diagnosis not present

## 2019-03-14 DIAGNOSIS — Z888 Allergy status to other drugs, medicaments and biological substances status: Secondary | ICD-10-CM

## 2019-03-14 DIAGNOSIS — Z87891 Personal history of nicotine dependence: Secondary | ICD-10-CM

## 2019-03-14 DIAGNOSIS — I110 Hypertensive heart disease with heart failure: Secondary | ICD-10-CM | POA: Diagnosis present

## 2019-03-14 DIAGNOSIS — T148XXA Other injury of unspecified body region, initial encounter: Secondary | ICD-10-CM | POA: Diagnosis not present

## 2019-03-14 DIAGNOSIS — K921 Melena: Secondary | ICD-10-CM | POA: Diagnosis present

## 2019-03-14 DIAGNOSIS — R739 Hyperglycemia, unspecified: Secondary | ICD-10-CM | POA: Diagnosis not present

## 2019-03-14 DIAGNOSIS — Z825 Family history of asthma and other chronic lower respiratory diseases: Secondary | ICD-10-CM | POA: Diagnosis not present

## 2019-03-14 DIAGNOSIS — R062 Wheezing: Secondary | ICD-10-CM | POA: Diagnosis not present

## 2019-03-14 DIAGNOSIS — J9621 Acute and chronic respiratory failure with hypoxia: Secondary | ICD-10-CM

## 2019-03-14 DIAGNOSIS — J9690 Respiratory failure, unspecified, unspecified whether with hypoxia or hypercapnia: Secondary | ICD-10-CM | POA: Diagnosis present

## 2019-03-14 DIAGNOSIS — Z882 Allergy status to sulfonamides status: Secondary | ICD-10-CM

## 2019-03-14 DIAGNOSIS — Z8679 Personal history of other diseases of the circulatory system: Secondary | ICD-10-CM | POA: Diagnosis not present

## 2019-03-14 DIAGNOSIS — Z86711 Personal history of pulmonary embolism: Secondary | ICD-10-CM

## 2019-03-14 DIAGNOSIS — R0789 Other chest pain: Secondary | ICD-10-CM | POA: Diagnosis not present

## 2019-03-14 DIAGNOSIS — E662 Morbid (severe) obesity with alveolar hypoventilation: Secondary | ICD-10-CM

## 2019-03-14 DIAGNOSIS — K219 Gastro-esophageal reflux disease without esophagitis: Secondary | ICD-10-CM | POA: Diagnosis present

## 2019-03-14 DIAGNOSIS — Z7401 Bed confinement status: Secondary | ICD-10-CM | POA: Diagnosis not present

## 2019-03-14 DIAGNOSIS — F419 Anxiety disorder, unspecified: Secondary | ICD-10-CM | POA: Diagnosis present

## 2019-03-14 DIAGNOSIS — R0689 Other abnormalities of breathing: Secondary | ICD-10-CM | POA: Diagnosis not present

## 2019-03-14 DIAGNOSIS — Z885 Allergy status to narcotic agent status: Secondary | ICD-10-CM

## 2019-03-14 DIAGNOSIS — I4892 Unspecified atrial flutter: Secondary | ICD-10-CM | POA: Diagnosis present

## 2019-03-14 DIAGNOSIS — Z803 Family history of malignant neoplasm of breast: Secondary | ICD-10-CM

## 2019-03-14 DIAGNOSIS — R0902 Hypoxemia: Secondary | ICD-10-CM | POA: Diagnosis not present

## 2019-03-14 DIAGNOSIS — Z23 Encounter for immunization: Secondary | ICD-10-CM

## 2019-03-14 DIAGNOSIS — I5033 Acute on chronic diastolic (congestive) heart failure: Secondary | ICD-10-CM

## 2019-03-14 DIAGNOSIS — Z20828 Contact with and (suspected) exposure to other viral communicable diseases: Secondary | ICD-10-CM | POA: Diagnosis present

## 2019-03-14 DIAGNOSIS — G8929 Other chronic pain: Secondary | ICD-10-CM | POA: Diagnosis present

## 2019-03-14 DIAGNOSIS — J9611 Chronic respiratory failure with hypoxia: Secondary | ICD-10-CM

## 2019-03-14 DIAGNOSIS — E0590 Thyrotoxicosis, unspecified without thyrotoxic crisis or storm: Secondary | ICD-10-CM | POA: Diagnosis present

## 2019-03-14 DIAGNOSIS — Z881 Allergy status to other antibiotic agents status: Secondary | ICD-10-CM

## 2019-03-14 DIAGNOSIS — I517 Cardiomegaly: Secondary | ICD-10-CM | POA: Diagnosis not present

## 2019-03-14 DIAGNOSIS — Z8249 Family history of ischemic heart disease and other diseases of the circulatory system: Secondary | ICD-10-CM | POA: Diagnosis not present

## 2019-03-14 DIAGNOSIS — R000 Tachycardia, unspecified: Secondary | ICD-10-CM | POA: Diagnosis not present

## 2019-03-14 DIAGNOSIS — J9811 Atelectasis: Secondary | ICD-10-CM | POA: Diagnosis not present

## 2019-03-14 DIAGNOSIS — R079 Chest pain, unspecified: Secondary | ICD-10-CM | POA: Diagnosis not present

## 2019-03-14 DIAGNOSIS — Z66 Do not resuscitate: Secondary | ICD-10-CM | POA: Diagnosis present

## 2019-03-14 DIAGNOSIS — Z7901 Long term (current) use of anticoagulants: Secondary | ICD-10-CM | POA: Diagnosis not present

## 2019-03-14 DIAGNOSIS — M2550 Pain in unspecified joint: Secondary | ICD-10-CM | POA: Diagnosis not present

## 2019-03-14 DIAGNOSIS — E876 Hypokalemia: Secondary | ICD-10-CM | POA: Diagnosis present

## 2019-03-14 DIAGNOSIS — R069 Unspecified abnormalities of breathing: Secondary | ICD-10-CM | POA: Diagnosis not present

## 2019-03-14 DIAGNOSIS — E1165 Type 2 diabetes mellitus with hyperglycemia: Secondary | ICD-10-CM | POA: Diagnosis not present

## 2019-03-14 DIAGNOSIS — I5030 Unspecified diastolic (congestive) heart failure: Secondary | ICD-10-CM | POA: Diagnosis present

## 2019-03-14 LAB — COMPREHENSIVE METABOLIC PANEL
ALT: 23 U/L (ref 0–44)
AST: 19 U/L (ref 15–41)
Albumin: 3.6 g/dL (ref 3.5–5.0)
Alkaline Phosphatase: 87 U/L (ref 38–126)
Anion gap: 11 (ref 5–15)
BUN: 16 mg/dL (ref 8–23)
CO2: 26 mmol/L (ref 22–32)
Calcium: 8.6 mg/dL — ABNORMAL LOW (ref 8.9–10.3)
Chloride: 103 mmol/L (ref 98–111)
Creatinine, Ser: 0.57 mg/dL (ref 0.44–1.00)
GFR calc Af Amer: >60 mL/min (ref >60)
GFR calc non Af Amer: >60 mL/min (ref >60)
Glucose, Bld: 142 mg/dL — ABNORMAL HIGH (ref 70–99)
Potassium: 3.2 mmol/L — ABNORMAL LOW (ref 3.5–5.1)
Sodium: 140 mmol/L (ref 135–145)
Total Bilirubin: 0.8 mg/dL (ref 0.3–1.2)
Total Protein: 7.9 g/dL (ref 6.5–8.1)

## 2019-03-14 LAB — CBC WITH DIFFERENTIAL/PLATELET
Abs Immature Granulocytes: 0.09 10*3/uL — ABNORMAL HIGH (ref 0.00–0.07)
Basophils Absolute: 0 10*3/uL (ref 0.0–0.1)
Basophils Relative: 0 %
Eosinophils Absolute: 0.1 10*3/uL (ref 0.0–0.5)
Eosinophils Relative: 1 %
HCT: 36.4 % (ref 36.0–46.0)
Hemoglobin: 10.5 g/dL — ABNORMAL LOW (ref 12.0–15.0)
Immature Granulocytes: 1 %
Lymphocytes Relative: 8 %
Lymphs Abs: 0.9 10*3/uL (ref 0.7–4.0)
MCH: 27.3 pg (ref 26.0–34.0)
MCHC: 28.8 g/dL — ABNORMAL LOW (ref 30.0–36.0)
MCV: 94.8 fL (ref 80.0–100.0)
Monocytes Absolute: 0.3 10*3/uL (ref 0.1–1.0)
Monocytes Relative: 3 %
Neutro Abs: 9.8 10*3/uL — ABNORMAL HIGH (ref 1.7–7.7)
Neutrophils Relative %: 87 %
Platelets: 524 10*3/uL — ABNORMAL HIGH (ref 150–400)
RBC: 3.84 MIL/uL — ABNORMAL LOW (ref 3.87–5.11)
RDW: 21.2 % — ABNORMAL HIGH (ref 11.5–15.5)
WBC: 11.2 10*3/uL — ABNORMAL HIGH (ref 4.0–10.5)
nRBC: 0.3 % — ABNORMAL HIGH (ref 0.0–0.2)

## 2019-03-14 LAB — TROPONIN I (HIGH SENSITIVITY)
Troponin I (High Sensitivity): 8 ng/L (ref <18)
Troponin I (High Sensitivity): 6 ng/L (ref <18)

## 2019-03-14 LAB — SARS CORONAVIRUS 2 BY RT PCR (HOSPITAL ORDER, PERFORMED IN ~~LOC~~ HOSPITAL LAB): SARS Coronavirus 2: NEGATIVE

## 2019-03-14 LAB — BRAIN NATRIURETIC PEPTIDE: B Natriuretic Peptide: 68.6 pg/mL (ref 0.0–100.0)

## 2019-03-14 MED ORDER — LORATADINE 10 MG PO TABS
10.0000 mg | ORAL_TABLET | Freq: Every day | ORAL | Status: DC
  Administered 2019-03-14 – 2019-03-22 (×9): 10 mg via ORAL
  Filled 2019-03-14 (×9): qty 1

## 2019-03-14 MED ORDER — FUROSEMIDE 10 MG/ML IJ SOLN
40.0000 mg | Freq: Once | INTRAMUSCULAR | Status: AC
  Administered 2019-03-14: 40 mg via INTRAVENOUS
  Filled 2019-03-14: qty 4

## 2019-03-14 MED ORDER — METHYLPREDNISOLONE SODIUM SUCC 125 MG IJ SOLR
60.0000 mg | Freq: Four times a day (QID) | INTRAMUSCULAR | Status: DC
  Administered 2019-03-14 – 2019-03-18 (×17): 60 mg via INTRAVENOUS
  Filled 2019-03-14 (×17): qty 2

## 2019-03-14 MED ORDER — IPRATROPIUM-ALBUTEROL 0.5-2.5 (3) MG/3ML IN SOLN
3.0000 mL | Freq: Once | RESPIRATORY_TRACT | Status: AC
  Administered 2019-03-14: 3 mL via RESPIRATORY_TRACT
  Filled 2019-03-14: qty 3

## 2019-03-14 MED ORDER — FLECAINIDE ACETATE 100 MG PO TABS
100.0000 mg | ORAL_TABLET | Freq: Two times a day (BID) | ORAL | Status: DC
  Administered 2019-03-14 – 2019-03-22 (×17): 100 mg via ORAL
  Filled 2019-03-14 (×19): qty 1

## 2019-03-14 MED ORDER — POLYETHYLENE GLYCOL 3350 17 G PO PACK
17.0000 g | PACK | Freq: Every day | ORAL | Status: DC | PRN
  Administered 2019-03-16 – 2019-03-17 (×2): 17 g via ORAL
  Filled 2019-03-14 (×2): qty 1

## 2019-03-14 MED ORDER — ACETAMINOPHEN 325 MG PO TABS: 650.0000 mg | ORAL_TABLET | Freq: Four times a day (QID) | ORAL | Status: DC | PRN

## 2019-03-14 MED ORDER — CYCLOSPORINE 0.05 % OP EMUL
1.0000 [drp] | Freq: Two times a day (BID) | OPHTHALMIC | Status: DC
  Administered 2019-03-14 – 2019-03-22 (×17): 1 [drp] via OPHTHALMIC
  Filled 2019-03-14 (×21): qty 30

## 2019-03-14 MED ORDER — ALBUTEROL SULFATE HFA 108 (90 BASE) MCG/ACT IN AERS: 6.0000 | INHALATION_SPRAY | Freq: Once | RESPIRATORY_TRACT | Status: DC

## 2019-03-14 MED ORDER — ALBUTEROL SULFATE (2.5 MG/3ML) 0.083% IN NEBU: 2.5000 mg | INHALATION_SOLUTION | Freq: Four times a day (QID) | RESPIRATORY_TRACT | Status: DC | PRN

## 2019-03-14 MED ORDER — ONDANSETRON HCL 4 MG PO TABS: 4.0000 mg | ORAL_TABLET | Freq: Four times a day (QID) | ORAL | Status: DC | PRN

## 2019-03-14 MED ORDER — HYDROXYZINE HCL 25 MG PO TABS
25.0000 mg | ORAL_TABLET | Freq: Two times a day (BID) | ORAL | Status: DC | PRN
  Administered 2019-03-15 – 2019-03-21 (×9): 25 mg via ORAL
  Filled 2019-03-14 (×9): qty 1

## 2019-03-14 MED ORDER — HYDROXYZINE PAMOATE 25 MG PO CAPS: 25.0000 mg | ORAL_CAPSULE | Freq: Two times a day (BID) | ORAL | Status: DC | PRN

## 2019-03-14 MED ORDER — IPRATROPIUM-ALBUTEROL 0.5-2.5 (3) MG/3ML IN SOLN
3.0000 mL | Freq: Four times a day (QID) | RESPIRATORY_TRACT | Status: DC
  Administered 2019-03-14 – 2019-03-19 (×22): 3 mL via RESPIRATORY_TRACT
  Filled 2019-03-14 (×22): qty 3

## 2019-03-14 MED ORDER — FAMOTIDINE 20 MG PO TABS
20.0000 mg | ORAL_TABLET | Freq: Every day | ORAL | Status: DC
  Administered 2019-03-14 – 2019-03-22 (×9): 20 mg via ORAL
  Filled 2019-03-14 (×9): qty 1

## 2019-03-14 MED ORDER — APIXABAN 5 MG PO TABS
5.0000 mg | ORAL_TABLET | Freq: Two times a day (BID) | ORAL | Status: DC
  Administered 2019-03-14 – 2019-03-22 (×17): 5 mg via ORAL
  Filled 2019-03-14 (×18): qty 1

## 2019-03-14 MED ORDER — INFLUENZA VAC SPLIT QUAD 0.5 ML IM SUSY
0.5000 mL | PREFILLED_SYRINGE | INTRAMUSCULAR | Status: AC
  Administered 2019-03-15: 0.5 mL via INTRAMUSCULAR
  Filled 2019-03-14: qty 0.5

## 2019-03-14 MED ORDER — IOHEXOL 350 MG/ML SOLN
100.0000 mL | Freq: Once | INTRAVENOUS | Status: AC | PRN
  Administered 2019-03-14: 100 mL via INTRAVENOUS

## 2019-03-14 MED ORDER — FLUTICASONE PROPIONATE 50 MCG/ACT NA SUSP
2.0000 | Freq: Every day | NASAL | Status: DC
  Administered 2019-03-14 – 2019-03-21 (×8): 2 via NASAL
  Filled 2019-03-14: qty 16

## 2019-03-14 MED ORDER — ONDANSETRON HCL 4 MG/2ML IJ SOLN: 4.0000 mg | Freq: Four times a day (QID) | INTRAMUSCULAR | Status: DC | PRN

## 2019-03-14 MED ORDER — DILTIAZEM HCL ER COATED BEADS 120 MG PO CP24
120.0000 mg | ORAL_CAPSULE | Freq: Every day | ORAL | Status: DC
  Administered 2019-03-14 – 2019-03-21 (×8): 120 mg via ORAL
  Filled 2019-03-14 (×9): qty 1

## 2019-03-14 MED ORDER — VENLAFAXINE HCL ER 150 MG PO CP24
150.0000 mg | ORAL_CAPSULE | Freq: Once | ORAL | Status: AC
  Administered 2019-03-14: 150 mg via ORAL
  Filled 2019-03-14: qty 1

## 2019-03-14 MED ORDER — GUAIFENESIN ER 600 MG PO TB12
600.0000 mg | ORAL_TABLET | Freq: Two times a day (BID) | ORAL | Status: DC
  Administered 2019-03-15 – 2019-03-22 (×14): 600 mg via ORAL
  Filled 2019-03-14 (×17): qty 1

## 2019-03-14 MED ORDER — SODIUM CHLORIDE (PF) 0.9 % IJ SOLN
INTRAMUSCULAR | Status: AC
  Filled 2019-03-14: qty 50

## 2019-03-14 MED ORDER — ACETAMINOPHEN 325 MG PO TABS
650.0000 mg | ORAL_TABLET | Freq: Four times a day (QID) | ORAL | Status: DC | PRN
  Administered 2019-03-15 – 2019-03-21 (×6): 650 mg via ORAL
  Filled 2019-03-14 (×7): qty 2

## 2019-03-14 MED ORDER — METOPROLOL TARTRATE 25 MG PO TABS
25.0000 mg | ORAL_TABLET | Freq: Two times a day (BID) | ORAL | Status: DC
  Administered 2019-03-14 – 2019-03-22 (×16): 25 mg via ORAL
  Filled 2019-03-14 (×17): qty 1

## 2019-03-14 MED ORDER — DICLOFENAC SODIUM 1 % TD GEL
2.0000 g | Freq: Four times a day (QID) | TRANSDERMAL | Status: DC | PRN
  Administered 2019-03-16: 2 g via TOPICAL
  Filled 2019-03-14: qty 100

## 2019-03-14 MED ORDER — ACETAMINOPHEN 650 MG RE SUPP: 650.0000 mg | Freq: Four times a day (QID) | RECTAL | Status: DC | PRN

## 2019-03-14 NOTE — ED Triage Notes (Signed)
[facility] transported pt from home to [facility] ED and reports the following  On 03/13/19 around 8:00 AM pt reports she began feeling SOB. Worse when walking, has chest tightness as well. Intermittent chest tightness accompanies SOB. Her home O2 monitor in 80's when ambulating.   Has rx for albuterol, it helps around 15 minutes. Non productive cough. No fever.   Given IM epi 0.3 mg, solu-medrol 125 mg around 5:00 AM.  12-lead: Varied between NSR and ST

## 2019-03-14 NOTE — ED Notes (Signed)
x2 unsuccessful IV attempts to get  20G above wrist for CT angio.

## 2019-03-14 NOTE — ED Notes (Signed)
ED TO INPATIENT HANDOFF REPORT  Name/Age/Gender [name] White 63 y.o. female  Code Status    Code Status Orders  (From admission, onward)         Start     Ordered   03/14/19 1143  Do not attempt resuscitation (DNR)  Continuous    Question Answer Comment  In the event of cardiac or respiratory ARREST Do not call a "code blue"   In the event of cardiac or respiratory ARREST Do not perform Intubation, CPR, defibrillation or ACLS   In the event of cardiac or respiratory ARREST Use medication by any route, position, wound care, and other measures to relive pain and suffering. May use oxygen, suction and manual treatment of airway obstruction as needed for comfort.      03/14/19 1142        Code Status History    Date Active Date Inactive Code Status Order ID Comments User Context   03/14/2019 1033 03/14/2019 1142 DNR [id number]  Desiree Hane, MD ED   03/06/2019 0601 03/08/2019 2004 Full Code [id number]  Lyndee Hensen, MD ED   02/26/2019 1456 02/28/2019 2011 Full Code [id number]  Danna Hefty, DO ED   12/26/2018 1539 01/09/2019 2105 Full Code [id number]  Pattricia Boss, MD ED   09/09/2018 2154 09/13/2018 2016 Full Code [id number]  Vilma Prader, MD Inpatient   08/29/2018 1646 09/07/2018 2004 Full Code [id number]  Dola, Geary, DO ED   08/08/2018 0206 08/20/2018 1857 Full Code [id number]  Elwyn Reach, MD Inpatient   07/01/2018 1138 07/03/2018 1951 Full Code [id number]  Edwin Dada, MD ED   09/07/2015 2209 09/16/2015 1819 Full Code [id number]  Laverle Hobby, PA-C Inpatient   09/06/2015 2307 09/07/2015 2209 Full Code [id number]  Albion Lions, PA-C ED   05/27/2015 1359 05/28/2015 1419 Full Code [id number]  Evans Lance, MD Inpatient   03/22/2015 1734 03/23/2015 2127 Full Code [id number]  Janora Norlander, DO Inpatient   09/28/2014 0241 10/01/2014 0635 Full Code [id number]  Rosemarie Ax, MD Inpatient   03/05/2014 1333 03/06/2014 0422 Full Code [id number]  Starlyn Skeans,  PA-C ED   01/31/2014 1103 02/04/2014 1831 Full Code [id number]  Hilton Sinclair, MD Inpatient   12/28/2013 0155 12/28/2013 1844 Full Code [id number]  Archie Patten, MD Inpatient   04/06/2013 2305 04/08/2013 0006 Full Code [id number]  Hilton Sinclair, MD Inpatient   Advance Care Planning Activity    Advance Directive Documentation     Most Recent Value  Type of Advance Directive  Healthcare Power of Attorney, Living will  Pre-existing out of facility DNR order (yellow form or pink MOST form)  -  "MOST" Form in Place?  -      Home/SNF/Other Home  Chief Complaint Shortness of Breath  Level of Care/Admitting Diagnosis ED Disposition    ED Disposition Condition [place]: [facility] [[id number]]  Level of Care: Telemetry [5]  Admit to tele based on following criteria: Acute CHF  Covid Evaluation: Confirmed COVID Negative  Diagnosis: Acute on chronic respiratory failure with hypoxemia [id number]  Admitting Physician: Desiree Hane [id number]  Attending Physician: Desiree Hane [phone]  Estimated length of stay: past midnight tomorrow  Certification:: I certify this patient will need inpatient services for at least 2 midnights  PT Class (Do Not Modify): Inpatient [101]  PT Acc Code (Do Not Modify): Private [1]       Medical History Past  Medical History:  Diagnosis Date  . Abdominal wall hematoma 03/06/2019  . Acute bronchitis 08/29/2018  . Acute on chronic respiratory failure with hypoxia ([place]) 08/29/2018  . Anxiety   . Asthma   . Chronic lower back pain   . COPD (chronic obstructive pulmonary disease) ([place])   . Depression   . Exposure to Covid-19 Virus 11/02/2018  . Fall 02/2019  . Family history of anesthesia complication    "daughter; causes her to pass out afterwards"  . GERD (gastroesophageal reflux disease)   . History of atrial flutter 06/26/2015  . History of pulmonary embolus (PE) 11/03/2014  . Hypertension   .  Hyperthyroidism   . Left medial tibial stress syndrome 12/26/2017  . Lower GI bleed   . Migraine    "monthly" (12/28/2013)  . Non-traumatic rhabdomyolysis 11/03/2014  . Obesity hypoventilation syndrome ([place]) 03/06/2019  . Osteoarthritis    "both knees; back of my neck; right pelvic bone" (12/28/2013)  . Paroxysmal A-fib ([place])   . Pulmonary embolism ([place]) 12/28/2013   "2 clots in each lung"    Allergies Allergies  Allergen Reactions  . Caffeine Other (See Comments)    Migraine  . Hydralazine Hcl     Other reaction(s): Other (See Comments) AKI leading to rhabdo and electrolyte abnormalities  . Hydrocodone Nausea And Vomiting    Headache  . Ciprofloxacin Hives and Rash  . Erythromycin Hives and Rash  . Lisinopril Cough  . Oxycodone Nausea And Vomiting    Headache  . Sulfamethoxazole-Trimethoprim Rash    IV Location/Drains/Wounds Patient Lines/Drains/Airways Status   Active Line/Drains/Airways    Name:   Placement date:   Placement time:   Site:   Days:   Peripheral IV 03/14/19 Left;Anterior Forearm   03/14/19    1531    Forearm   less than 1   External Urinary Catheter   02/26/19    1644    -   16          Labs/Imaging Results for orders placed or performed during the hospital encounter of 03/14/19 (from the past 48 hour(s))  SARS Coronavirus 2 [facility] order, Performed in [facility] hospital lab) Nasopharyngeal Nasopharyngeal Swab     Status: None   Collection Time: 03/14/19  7:35 AM   Specimen: Nasopharyngeal Swab  Result Value Ref Range   SARS Coronavirus 2 NEGATIVE NEGATIVE    Comment: (NOTE) If result is NEGATIVE SARS-CoV-2 target nucleic acids are NOT DETECTED. The SARS-CoV-2 RNA is generally detectable in upper and lower  respiratory specimens during the acute phase of infection. The lowest  concentration of SARS-CoV-2 viral copies this assay can detect is 250  copies / mL. A negative result does not preclude SARS-CoV-2 infection  and should not be used as the sole  basis for treatment or other  patient management decisions.  A negative result may occur with  improper specimen collection / handling, submission of specimen other  than nasopharyngeal swab, presence of viral mutation(s) within the  areas targeted by this assay, and inadequate number of viral copies  (<250 copies / mL). A negative result must be combined with clinical  observations, patient history, and epidemiological information. If result is POSITIVE SARS-CoV-2 target nucleic acids are DETECTED. The SARS-CoV-2 RNA is generally detectable in upper and lower  respiratory specimens dur ing the acute phase of infection.  Positive  results are indicative of active infection with SARS-CoV-2.  Clinical  correlation with patient history and other diagnostic information is  necessary  to determine patient infection status.  Positive results do  not rule out bacterial infection or co-infection with other viruses. If result is PRESUMPTIVE POSTIVE SARS-CoV-2 nucleic acids MAY BE PRESENT.   A presumptive positive result was obtained on the submitted specimen  and confirmed on repeat testing.  While 2019 novel coronavirus  (SARS-CoV-2) nucleic acids may be present in the submitted sample  additional confirmatory testing may be necessary for epidemiological  and / or clinical management purposes  to differentiate between  SARS-CoV-2 and other Sarbecovirus currently known to infect humans.  If clinically indicated additional testing with an alternate test  methodology [phone]) is advised. The SARS-CoV-2 RNA is generally  detectable in upper and lower respiratory sp ecimens during the acute  phase of infection. The expected result is Negative. Fact Sheet for Patients:  StrictlyIdeas.no Fact Sheet for Healthcare Providers: BankingDealers.co.za This test is not yet approved or cleared by the [place] FDA and has been authorized for detection  and/or diagnosis of SARS-CoV-2 by FDA under an Emergency Use Authorization (EUA).  This EUA will remain in effect (meaning this test can be used) for the duration of the COVID-19 declaration under Section 564(b)(1) of the Act, 21 U.S.C. section 360bbb-3(b)(1), unless the authorization is terminated or revoked sooner. Performed at [facility], [place] [address], [place], [place] [address]   Comprehensive metabolic panel     Status: Abnormal   Collection Time: 03/14/19  7:50 AM  Result Value Ref Range   Sodium 140 135 - 145 mmol/L   Potassium 3.2 (L) 3.5 - 5.1 mmol/L   Chloride 103 98 - 111 mmol/L   CO2 26 22 - 32 mmol/L   Glucose, Bld 142 (H) 70 - 99 mg/dL   BUN 16 8 - 23 mg/dL   Creatinine, Ser 0.57 0.44 - 1.00 mg/dL   Calcium 8.6 (L) 8.9 - 10.3 mg/dL   Total Protein 7.9 6.5 - 8.1 g/dL   Albumin 3.6 3.5 - 5.0 g/dL   AST 19 15 - 41 U/L   ALT 23 0 - 44 U/L   Alkaline Phosphatase 87 38 - 126 U/L   Total Bilirubin 0.8 0.3 - 1.2 mg/dL   GFR calc non Af Amer >60 >60 mL/min   GFR calc Af Amer >60 >60 mL/min   Anion gap 11 5 - 15    Comment: Performed at [facility], [place] [address] [place], [place] [address]  Troponin I (High Sensitivity)     Status: None   Collection Time: 03/14/19  7:50 AM  Result Value Ref Range   Troponin I (High Sensitivity) 8 <18 ng/L    Comment: (NOTE) Elevated high sensitivity troponin I (hsTnI) values and significant  changes across serial measurements may suggest ACS but many other  chronic and acute conditions are known to elevate hsTnI results.  Refer to the "Links" section for chest pain algorithms and additional  guidance. Performed at [facility], [place] [address] [place],  [address]   CBC with Differential     Status: Abnormal   Collection Time: 03/14/19  7:50 AM  Result Value Ref Range   WBC 11.2 (H) 4.0 - 10.5 K/uL   RBC 3.84 (L) 3.87 - 5.11 MIL/uL   Hemoglobin 10.5 (L) 12.0 -  15.0 g/dL   HCT 36.4 36.0 - 46.0 %   MCV 94.8 80.0 - 100.0 fL   MCH 27.3 26.0 - 34.0 pg   MCHC 28.8 (L) 30.0 - 36.0 g/dL  RDW 21.2 (H) 11.5 - 15.5 %   Platelets 524 (H) 150 - 400 K/uL   nRBC 0.3 (H) 0.0 - 0.2 %   Neutrophils Relative % 87 %   Neutro Abs 9.8 (H) 1.7 - 7.7 K/uL   Lymphocytes Relative 8 %   Lymphs Abs 0.9 0.7 - 4.0 K/uL   Monocytes Relative 3 %   Monocytes Absolute 0.3 0.1 - 1.0 K/uL   Eosinophils Relative 1 %   Eosinophils Absolute 0.1 0.0 - 0.5 K/uL   Basophils Relative 0 %   Basophils Absolute 0.0 0.0 - 0.1 K/uL   Immature Granulocytes 1 %   Abs Immature Granulocytes 0.09 (H) 0.00 - 0.07 K/uL   Polychromasia PRESENT     Comment: Performed at [facility], [place] [address] [place], [place] [address]  Brain natriuretic peptide     Status: None   Collection Time: 03/14/19  7:50 AM  Result Value Ref Range   B Natriuretic Peptide 68.6 0.0 - 100.0 pg/mL    Comment: Performed at [facility], [place] [address], [place], [place] [address]  Troponin I (High Sensitivity)     Status: None   Collection Time: 03/14/19  1:41 PM  Result Value Ref Range   Troponin I (High Sensitivity) 6 <18 ng/L    Comment: (NOTE) Elevated high sensitivity troponin I (hsTnI) values and significant  changes across serial measurements may suggest ACS but many other  chronic and acute conditions are known to elevate hsTnI results.  Refer to the "Links" section for chest pain algorithms and additional  guidance. Performed at [facility], [place] [address] [place], [place] [address]    Ct Angio Chest Pe W Or Wo Contrast  Result Date: 03/14/2019 CLINICAL DATA:  Shortness of breath. EXAM: CT ANGIOGRAPHY CHEST WITH CONTRAST TECHNIQUE: Multidetector CT imaging of the chest was performed using the standard protocol during bolus administration of intravenous contrast. Multiplanar CT image reconstructions and MIPs were obtained to evaluate the  vascular anatomy. CONTRAST:  164mL OMNIPAQUE IOHEXOL 350 MG/ML SOLN COMPARISON:  CT scan of December 26, 2018. FINDINGS: Cardiovascular: There is no evidence of large central pulmonary embolus. However, due to persistent respiratory motion artifact, smaller emboli cannot be excluded in the peripheral aspects of the pulmonary arteries. There is no evidence of thoracic aortic dissection or aneurysm. Mild cardiomegaly is noted. No pericardial effusion is noted. Mediastinum/Nodes: Status post right thyroidectomy. No adenopathy is noted. Esophagus is unremarkable. Lungs/Pleura: No pneumothorax or pleural effusion is noted. Minimal bilateral posterior basilar subsegmental atelectasis is noted. Upper Abdomen: No acute abnormality. Musculoskeletal: Probable soft tissue contusion seen in the subcutaneous tissues of the epigastric region. No acute or significant osseous findings. Review of the MIP images confirms the above findings. IMPRESSION: No definite evidence of large central pulmonary embolus seen in the main pulmonary artery or main portions of the right and left pulmonary arteries. However, due to persistent respiratory motion artifact, smaller and more peripheral pulmonary emboli cannot be excluded on the basis of this exam. Probable soft tissue contusions seen in the subcutaneous tissues of the epigastric region of the abdomen. Electronically Signed   By: Marijo Conception M.D.   On: 03/14/2019 16:43   Dg Chest Portable 1 View  Result Date: 03/14/2019 CLINICAL DATA:  Shortness of breath. EXAM: PORTABLE CHEST 1 VIEW COMPARISON:  Radiographs of February 27, 2019. FINDINGS: Stable cardiomegaly. Atherosclerosis of thoracic aorta is noted. No pneumothorax or pleural effusion is noted. Probable central  pulmonary vascular congestion is noted. Mild right midlung subsegmental atelectasis or scarring is noted. Mild left lingular subsegmental atelectasis or scarring is noted. Bony thorax is unremarkable. Metallic density is seen  projected over the superior mediastinum which may represent overlying artifact. IMPRESSION: Stable cardiomegaly with probable central pulmonary vascular congestion. Mild bilateral subsegmental atelectasis or scarring is noted. Aortic Atherosclerosis (ICD10-I70.0). Electronically Signed   By: Marijo Conception M.D.   On: 03/14/2019 09:27    Pending Labs Unresulted Labs (From admission, onward)    Start     Ordered   03/15/19 [id number]  Basic metabolic panel  Tomorrow morning,   R     03/14/19 1142   03/15/19 0500  CBC  Tomorrow morning,   R     03/14/19 1142          Vitals/Pain Today's Vitals   03/14/19 1518 03/14/19 1722 03/14/19 1730 03/14/19 1800  BP: (!) 164/60 134/81 (!) 152/88 136/81  Pulse: 97 94 94 96  Resp: (!) 26 20 (!) 25 (!) 24  Temp:      TempSrc:      SpO2: 97% 98% 96% 97%  Weight:      Height:      PainSc:        Isolation Precautions No active isolations  Medications Medications  diltiazem (CARDIZEM CD) 24 hr capsule 120 mg (has no administration in time range)  flecainide (TAMBOCOR) tablet 100 mg (100 mg Oral Given 03/14/19 1302)  metoprolol tartrate (LOPRESSOR) tablet 25 mg (25 mg Oral Not Given 03/14/19 1340)  famotidine (PEPCID) tablet 20 mg (20 mg Oral Given 03/14/19 1304)  apixaban (ELIQUIS) tablet 5 mg (5 mg Oral Given 03/14/19 1302)  albuterol (PROVENTIL) (2.5 MG/3ML) 0.083% nebulizer solution 2.5 mg (has no administration in time range)  fluticasone (FLONASE) 50 MCG/ACT nasal spray 2 spray (has no administration in time range)  loratadine (CLARITIN) tablet 10 mg (10 mg Oral Given 03/14/19 1306)  cycloSPORINE (RESTASIS) 0.05 % ophthalmic emulsion 1 drop (1 drop Both Eyes Given 03/14/19 1311)  diclofenac sodium (VOLTAREN) 1 % transdermal gel 2 g (has no administration in time range)  ipratropium-albuterol (DUONEB) 0.5-2.5 (3) MG/3ML nebulizer solution 3 mL (3 mLs Nebulization Given 03/14/19 1412)  methylPREDNISolone sodium succinate (SOLU-MEDROL) 125 mg/2 mL  injection 60 mg (60 mg Intravenous Given 03/14/19 1314)  acetaminophen (TYLENOL) tablet 650 mg (has no administration in time range)    Or  acetaminophen (TYLENOL) suppository 650 mg (has no administration in time range)  polyethylene glycol (MIRALAX / GLYCOLAX) packet 17 g (has no administration in time range)  ondansetron (ZOFRAN) tablet 4 mg (has no administration in time range)    Or  ondansetron (ZOFRAN) injection 4 mg (has no administration in time range)  guaiFENesin (MUCINEX) 12 hr tablet 600 mg (600 mg Oral Not Given 03/14/19 1339)  influenza vac split quadrivalent PF (FLUARIX) injection 0.5 mL (has no administration in time range)  hydrOXYzine (ATARAX/VISTARIL) tablet 25 mg (has no administration in time range)  sodium chloride (PF) 0.9 % injection (has no administration in time range)  ipratropium-albuterol (DUONEB) 0.5-2.5 (3) MG/3ML nebulizer solution 3 mL (3 mLs Nebulization Given 03/14/19 0947)  furosemide (LASIX) injection 40 mg (40 mg Intravenous Given 03/14/19 1316)  iohexol (OMNIPAQUE) 350 MG/ML injection 100 mL (100 mLs Intravenous Contrast Given 03/14/19 1608)    Mobility walks with person assist

## 2019-03-14 NOTE — ED Provider Notes (Signed)
Emergency Department Provider Note   I have reviewed the triage vital signs and the nursing notes.   HISTORY  Chief Complaint Shortness of Breath   HPI [name] White is a 63 y.o. female with PMH of COPD, elevated BMI, CHF, recent fall with known abdominal wall hematoma, HTN, and PE on Eliquis presents to the emergency department for evaluation of shortness of breath with associated chest tightness.  She describes that chest tightness is in the center of her chest and intermittent.  She feels especially short of breath with ambulation.  Patient states that she has a home pulse oximeter which was reading in the low 80s.  She is on 2 L nasal cannula at baseline but has had to increase that this morning.  She ultimately called EMS who administered epinephrine IM and Solu-Medrol.  Patient has had some improvement in symptoms since that time.  She denies any fevers, chills, cough.  No abdominal pain, vomiting, diarrhea.  She has noticed her weight is increasing feels that her legs may be somewhat swollen as well.  Past Medical History:  Diagnosis Date   Abdominal wall hematoma 03/06/2019   Acute bronchitis 08/29/2018   Acute on chronic respiratory failure with hypoxia (HCC) 08/29/2018   Anxiety    Asthma    Chronic lower back pain    COPD (chronic obstructive pulmonary disease) ([place])    Depression    Exposure to Covid-19 Virus 11/02/2018   Fall 02/2019   Family history of anesthesia complication    "daughter; causes her to pass out afterwards"   GERD (gastroesophageal reflux disease)    History of atrial flutter 06/26/2015   History of pulmonary embolus (PE) 11/03/2014   Hypertension    Hyperthyroidism    Left medial tibial stress syndrome 12/26/2017   Lower GI bleed    Migraine    "monthly" (12/28/2013)   Non-traumatic rhabdomyolysis 11/03/2014   Obesity hypoventilation syndrome ([place]) 03/06/2019   Osteoarthritis    "both knees; back of my neck; right pelvic bone"  (12/28/2013)   Paroxysmal A-fib ([place])    Pulmonary embolism ([place]) 12/28/2013   "2 clots in each lung"    Patient Active Problem List   Diagnosis Date Noted   Acquired thrombophilia ([place]) due to A-Fib 03/07/2019   BRBPR (bright red blood per rectum) 03/06/2019   Heart failure with preserved ejection fraction ([place]) 03/06/2019   Active bleeding 03/06/2019   Obesity hypoventilation syndrome ([place]) 03/06/2019   Abdominal wall hematoma 03/06/2019   Lower GI bleed    High risk social situation    Urge incontinence 01/27/2019   Homelessness 12/30/2018   Reactive airway disease 09/10/2018   Sleep apnea 07/05/2018   Chronic respiratory failure with hypoxia ([place]) 07/05/2018   Hematoma 11/27/2017   Severe episode of recurrent major depressive disorder, without psychotic features ([place])    MDD (major depressive disorder), recurrent episode, severe ([place]) 09/07/2015   Atrial flutter ([place]) 07/29/2015   Asthma 11/12/2014   History of hypertension 11/05/2014   Paroxysmal atrial fibrillation ([place]) 11/03/2014   Chronic anticoagulation 11/03/2014   Major depressive disorder 11/03/2014   History of pulmonary embolus (PE) 11/03/2014   MDD (major depressive disorder), recurrent severe, without psychosis ([place]) 03/05/2014   Vocal cord dysfunction 11/12/2013   Mixed headache 01/06/2012   Hot flashes 04/22/2011   OVERACTIVE BLADDER 04/18/2008   Osteoarthritis of both knees 04/18/2008   Osteoarthritis involving multiple joints on both sides of body 09/14/2007   Morbid obesity ([place]) 08/24/2006  RESTLESS LEGS SYNDROME 08/24/2006   HYPERTENSION, BENIGN SYSTEMIC 08/24/2006   RHINITIS, ALLERGIC 08/24/2006   GASTROESOPHAGEAL REFLUX, NO ESOPHAGITIS 08/24/2006    Past Surgical History:  Procedure Laterality Date   ABDOMINAL HYSTERECTOMY     APPENDECTOMY     BREAST CYST EXCISION Right    DILATION AND CURETTAGE OF UTERUS     ELECTROPHYSIOLOGIC STUDY N/A 05/27/2015    Procedure: A-Flutter;  Surgeon: Evans Lance, MD;  Location: [facility];  Service: Cardiovascular;  Laterality: N/A;   EXCISIONAL HEMORRHOIDECTOMY     NASAL SINUS SURGERY  2007   THYROIDECTOMY, PARTIAL Right 2005   TUBAL LIGATION     WISDOM TOOTH EXTRACTION      Allergies Caffeine, Hydralazine hcl, Hydrocodone, Ciprofloxacin, Erythromycin, Lisinopril, Oxycodone, and Sulfamethoxazole-trimethoprim  Family History  Problem Relation Age of Onset   Osteoarthritis Mother    Asthma Mother    Heart failure Mother    Breast cancer Daughter     Social History Social History   Tobacco Use   Smoking status: Former Smoker    Packs/day: 0.25    Years: 20.00    Pack years: 5.00    Types: Cigarettes    Quit date: 07/17/1999    Years since quitting: 19.6   Smokeless tobacco: Never Used  Substance Use Topics   Alcohol use: Not Currently    Comment: "drank some in my 21's"   Drug use: No    Review of Systems  Constitutional: No fever/chills Eyes: No visual changes. ENT: No sore throat. Cardiovascular: Positive chest pain. Respiratory: Positive shortness of breath. Gastrointestinal: Positive abdominal wall pain and bruising.  No nausea, no vomiting.  No diarrhea.  No constipation. Genitourinary: Negative for dysuria. Musculoskeletal: Negative for back pain. Skin: Negative for rash. Neurological: Negative for headaches, focal weakness or numbness.  10-point ROS otherwise negative.  ____________________________________________   PHYSICAL EXAM:  VITAL SIGNS: ED Triage Vitals  Enc Vitals Group     BP 03/14/19 0524 (!) 122/92     Pulse Rate 03/14/19 0524 88     Resp 03/14/19 0600 (!) 28     Temp 03/14/19 0530 98.2 F (36.8 C)     Temp Source 03/14/19 0530 Oral     SpO2 03/14/19 0517 98 %     Weight 03/14/19 0526 (!) 356 lb (161.5 kg)     Height 03/14/19 0526 5\' 5"  (1.651 m)   Constitutional: Alert and oriented. Patient with some increased WOB.    Eyes: Conjunctivae are normal.  Head: Atraumatic. Nose: No congestion/rhinnorhea. Mouth/Throat: Mucous membranes are moist.  Oropharynx non-erythematous. Neck: No stridor.   Cardiovascular: Normal rate, regular rhythm. Good peripheral circulation. Grossly normal heart sounds.   Respiratory: Normal respiratory effort.  No retractions. Lungs CTAB. Gastrointestinal: Soft and nontender. No distention.  Musculoskeletal: No lower extremity tenderness nor edema. No gross deformities of extremities. Neurologic:  Normal speech and language. No gross focal neurologic deficits are appreciated.  Skin:  Skin is warm, dry and intact. No rash noted.  ____________________________________________   LABS (all labs ordered are listed, but only abnormal results are displayed)  Labs Reviewed  COMPREHENSIVE METABOLIC PANEL - Abnormal; Notable for the following components:      Result Value   Potassium 3.2 (*)    Glucose, Bld 142 (*)    Calcium 8.6 (*)    All other components within normal limits  CBC WITH DIFFERENTIAL/PLATELET - Abnormal; Notable for the following components:   WBC 11.2 (*)    RBC  3.84 (*)    Hemoglobin 10.5 (*)    MCHC 28.8 (*)    RDW 21.2 (*)    Platelets 524 (*)    nRBC 0.3 (*)    Neutro Abs 9.8 (*)    Abs Immature Granulocytes 0.09 (*)    All other components within normal limits  SARS CORONAVIRUS 2 (HOSPITAL ORDER, [facility])  BRAIN NATRIURETIC PEPTIDE  TROPONIN I (HIGH SENSITIVITY)  TROPONIN I (HIGH SENSITIVITY)   ____________________________________________  EKG   EKG Interpretation  Date/Time:  Thursday March 14 2019 05:41:36 EDT Ventricular Rate:  81 PR Interval:    QRS Duration: 98 QT Interval:  397 QTC Calculation: 461 R Axis:   24 Text Interpretation:  Sinus rhythm Baseline wander in lead(s) II V2 V4 V5 V6 Confirmed by Randal Buba, April ([id number]) on 03/14/2019 6:10:26 AM        ____________________________________________  RADIOLOGY  Dg Chest Portable 1 View  Result Date: 03/14/2019 CLINICAL DATA:  Shortness of breath. EXAM: PORTABLE CHEST 1 VIEW COMPARISON:  Radiographs of February 27, 2019. FINDINGS: Stable cardiomegaly. Atherosclerosis of thoracic aorta is noted. No pneumothorax or pleural effusion is noted. Probable central pulmonary vascular congestion is noted. Mild right midlung subsegmental atelectasis or scarring is noted. Mild left lingular subsegmental atelectasis or scarring is noted. Bony thorax is unremarkable. Metallic density is seen projected over the superior mediastinum which may represent overlying artifact. IMPRESSION: Stable cardiomegaly with probable central pulmonary vascular congestion. Mild bilateral subsegmental atelectasis or scarring is noted. Aortic Atherosclerosis (ICD10-I70.0). Electronically Signed   By: Marijo Conception M.D.   On: 03/14/2019 09:27    ____________________________________________   PROCEDURES  Procedure(s) performed:   Procedures  CRITICAL CARE Performed by: Margette Fast Total critical care time: 35 minutes Critical care time was exclusive of separately billable procedures and treating other patients. Critical care was necessary to treat or prevent imminent or life-threatening deterioration. Critical care was time spent personally by me on the following activities: development of treatment plan with patient and/or surrogate as well as nursing, discussions with consultants, evaluation of patient's response to treatment, examination of patient, obtaining history from patient or surrogate, ordering and performing treatments and interventions, ordering and review of laboratory studies, ordering and review of radiographic studies, pulse oximetry and re-evaluation of patient's condition.  Nanda Quinton, MD Emergency Medicine  ____________________________________________   INITIAL IMPRESSION / ASSESSMENT AND PLAN  / ED COURSE  Pertinent labs & imaging results that were available during my care of the patient were reviewed by me and considered in my medical decision making (see chart for details).   Patient presents to the emergency department with shortness of breath and chest discomfort worsening this morning.  She is on increased oxygen requirement.  She does have history of PE but has been compliant with her Eliquis.  She has some faint wheezing on exam.  Suspect that body habitus and possible fluid overload also contributing.  She has received steroid with EMS and epinephrine.  She is not experiencing hypotension or other signs of anaphylaxis.  Plan for rapid COVID testing as she may require additional nebulizers.  Does not require airway management at this time.   Chest x-ray reviewed with no acute findings.  Patient is on increased oxygen from her baseline.  COVID negative.  Will start DuoNeb here in that setting.  Patient continues to have dyspnea.  Discussed patient's case with [facility] to request admission. Patient and family (if present) updated with plan. Care  transferred to [facility] service.  I reviewed all nursing notes, vitals, pertinent old records, EKGs, labs, imaging (as available).  ____________________________________________  FINAL CLINICAL IMPRESSION(S) / ED DIAGNOSES  Final diagnoses:  SOB (shortness of breath)  COPD exacerbation (HCC)  Hypoxia     MEDICATIONS GIVEN DURING THIS VISIT:  Medications  ipratropium-albuterol (DUONEB) 0.5-2.5 (3) MG/3ML nebulizer solution 3 mL (3 mLs Nebulization Given 03/14/19 0947)     Note:  This document was prepared using Dragon voice recognition software and may include unintentional dictation errors.  Nanda Quinton, MD, [facility] Emergency Medicine    Tycho Cheramie, [facility], MD 03/14/19 1003

## 2019-03-14 NOTE — H&P (Signed)
[facility] History and Physical  [name] White [id number] DOB: 15-Jun-1956 DOA: 03/14/2019  Referring physician: Nanda Quinton PCP: [name], MD   Chief Complaint: shortness of breath  HPI: [name] White is a 63 y.o. female with medical history significant for paroxysmal A. fib on eliquis, history of PE (2015), COPD, chronic respiratory failure on 2 L O2 at home, asthma, MDD, anxiety and reported OSA (n formal sleep study)who presents on 03/14/2019 with progressive dyspnea, nonproductive cough over the past 1 to 2 days.  She states her shortness of breath progressively worsened throughout the day and shortness with the most while walking back and forth in her home.  It did not improve with her home inhalers.  She also noticed some associated chest tightness.  She measured her oxygen at home and found it to be in the low 80s.  Denied any fevers, no nausea, no vomiting, no sick contacts.  Denies any new lower leg swelling.  Does not weigh herself daily.  Does not take any Lasix for heart failure.  Patient was recently discharged (admission 9/8-9/10) for abdminal wall hematoma sustained after a fall while on anticoagulation for PE/AF.  During that admission, she reported BRBPR, and epiode of melena but no bleeding durng the admission ( did have positive FOBT) patient was switched from Xarelto to eliquis on discharge with outpatient colonoscopy arranged given stable hemoglobin of 10.2.   She was recently seen in the ED on 9/15 for concerns for bright red blood per rectum related to her recently diagnosed abdominal wall hematoma in the setting of anticoagulation therapy.  She instructed to hold off on further use of her Eliquis and resume in 2 days.  Patient has not taken Eliquis since the evening of 9/16    ED Course: Afebrile, respiratory rate 22, SPO2 67% on 2 L increased to 3 L with normal oxygen saturation.  Lab work notable for potassium 3.2, WBC 11.2, hemoglobin 10.5, BNP 68,  COVID test negative, high-sensitivity from 8, chest x-ray showed cardiomegaly with probable central pulmonary vascular congestion with atelectasis.     Review of Systems:  Constitutional:  No weight loss, night sweats, Fevers, chills, fatigue.  HEENT:  No headaches, Difficulty swallowing,Tooth/dental problems,Sore throat,  No sneezing, itching, ear ache, nasal congestion, post nasal drip,  Cardio-vascular:  No chest pain, Orthopnea, PND, swelling in lower extremities, anasarca, dizziness, palpitations  GI:  No heartburn, indigestion, abdominal pain, nausea, vomiting, diarrhea, change in bowel habits, loss of appetite  Resp:  shortness of breath with exertion or at rest. No excess mucus, no productive cough, non-productive cough, No coughing up of blood.No change in color of mucus.No wheezing.No chest wall deformity  Skin:  no rash or lesions.  GU:  no dysuria, change in color of urine, no urgency or frequency. No flank pain.  Musculoskeletal:  No joint pain or swelling. No decreased range of motion. No back pain.  Psych:  No change in mood or affect. No depression or anxiety. No memory loss.   Past Medical History:  Diagnosis Date   Abdominal wall hematoma 03/06/2019   Acute bronchitis 08/29/2018   Acute on chronic respiratory failure with hypoxia (HCC) 08/29/2018   Anxiety    Asthma    Chronic lower back pain    COPD (chronic obstructive pulmonary disease) ([place])    Depression    Exposure to Covid-19 Virus 11/02/2018   Fall 02/2019   Family history of anesthesia complication    "daughter; causes her to pass out  afterwards"   GERD (gastroesophageal reflux disease)    History of atrial flutter 06/26/2015   History of pulmonary embolus (PE) 11/03/2014   Hypertension    Hyperthyroidism    Left medial tibial stress syndrome 12/26/2017   Lower GI bleed    Migraine    "monthly" (12/28/2013)   Non-traumatic rhabdomyolysis 11/03/2014   Obesity hypoventilation syndrome  ([place]) 03/06/2019   Osteoarthritis    "both knees; back of my neck; right pelvic bone" (12/28/2013)   Paroxysmal A-fib ([place])    Pulmonary embolism ([place]) 12/28/2013   "2 clots in each lung"   Past Surgical History:  Procedure Laterality Date   ABDOMINAL HYSTERECTOMY     APPENDECTOMY     BREAST CYST EXCISION Right    DILATION AND CURETTAGE OF UTERUS     ELECTROPHYSIOLOGIC STUDY N/A 05/27/2015   Procedure: A-Flutter;  Surgeon: Evans Lance, MD;  Location: [facility];  Service: Cardiovascular;  Laterality: N/A;   EXCISIONAL HEMORRHOIDECTOMY     NASAL SINUS SURGERY  2007   THYROIDECTOMY, PARTIAL Right 2005   TUBAL LIGATION     WISDOM TOOTH EXTRACTION     Social History:  reports that she quit smoking about 19 years ago. Her smoking use included cigarettes. She has a 5.00 pack-year smoking history. She has never used smokeless tobacco. She reports previous alcohol use. She reports that she does not use drugs.  Allergies  Allergen Reactions   Caffeine Other (See Comments)    Migraine   Hydralazine Hcl     Other reaction(s): Other (See Comments) AKI leading to rhabdo and electrolyte abnormalities   Hydrocodone Nausea And Vomiting    Headache   Ciprofloxacin Hives and Rash   Erythromycin Hives and Rash   Lisinopril Cough   Oxycodone Nausea And Vomiting    Headache   Sulfamethoxazole-Trimethoprim Rash    Family History  Problem Relation Age of Onset   Osteoarthritis Mother    Asthma Mother    Heart failure Mother    Breast cancer Daughter     Prior to Admission medications   Medication Sig Start Date End Date Taking? Authorizing Provider  acetaminophen (TYLENOL) 325 MG tablet Take 2 tablets (650 mg total) by mouth every 6 (six) hours as needed for mild pain. 09/11/18  Yes Georgette Shell, MD  albuterol (PROVENTIL) (2.5 MG/3ML) 0.083% nebulizer solution Take 3 mLs (2.5 mg total) by nebulization every 6 (six) hours as needed for wheezing or  shortness of breath. 07/09/18  Yes Collene Gobble, MD  apixaban (ELIQUIS) 5 MG TABS tablet Take 1 tablet (5 mg total) by mouth 2 (two) times daily. 03/08/19  Yes Winfrey, Alcario Drought, MD  cycloSPORINE (RESTASIS) 0.05 % ophthalmic emulsion Place 1 drop into both eyes 2 (two) times daily.   Yes [provider]  diclofenac sodium (VOLTAREN) 1 % GEL APPLY 2 GRAMS EXTERNALLY TO THE AFFECTED AREA FOUR TIMES DAILY Patient taking differently: Apply 2 g topically 4 (four) times daily as needed (pain/ shoulder & right theigh).  07/31/18  Yes [name], MD  diltiazem (CARDIZEM CD) 120 MG 24 hr capsule Take 1 capsule (120 mg total) by mouth daily. Please keep upcoming appt in January before anymore refills. Final Attempt Patient taking differently: Take 120 mg by mouth at bedtime. Please keep upcoming appt in January before anymore refills. Final Attempt 07/04/18  Yes Seiler, Cecil Cobbs, NP  famotidine (PEPCID) 20 MG tablet Take 1 tablet (20 mg total) by mouth daily. 12/22/18  Yes [name], MD  flecainide (TAMBOCOR) 100 MG tablet TAKE 1 TABLET BY MOUTH 2 TIMES A DAY. PLEASE KEEP UPCOMING APPOINTMENT IN JANUARY BEFORE ANYMORE REFILLS. Patient taking differently: Take 100 mg by mouth 2 (two) times daily.  12/03/18  Yes Seiler, Amber K, NP  fluticasone (FLONASE) 50 MCG/ACT nasal spray Place 2 sprays into both nostrils daily.  10/29/18  Yes [provider]  hydrOXYzine (VISTARIL) 25 MG capsule Take 25 mg by mouth 2 (two) times daily as needed for anxiety. 11/26/18  Yes [provider]  loratadine (CLARITIN) 10 MG tablet TAKE 1 TABLET(10 MG) BY MOUTH DAILY Patient taking differently: Take 10 mg by mouth daily.  03/05/19  Yes [name], MD  metoprolol tartrate (LOPRESSOR) 25 MG tablet TAKE 1 TABLET BY MOUTH TWICE DAILY Patient taking differently: Take 25 mg by mouth 2 (two) times daily.  03/11/19  Yes [name], MD  OXYGEN 2L at bedtime and prn exertion   Yes [provider]    umeclidinium bromide (INCRUSE ELLIPTA) 62.5 MCG/INH AEPB Inhale 1 puff into the lungs daily. 03/01/19  Yes Winfrey, Alcario Drought, MD  venlafaxine (EFFEXOR) 37.5 MG tablet Take 187.5 mg by mouth daily. Take 37.5mg  tablet with 150mg  03/12/19  Yes [provider]  venlafaxine XR (EFFEXOR-XR) 150 MG 24 hr capsule Take 187.5 mg by mouth daily with breakfast. Take 150mg  capsule with 37.5mg  tablet   Yes [provider]  cyclobenzaprine (FLEXERIL) 10 MG tablet TAKE 1 TABLET BY MOUTH EVERY 8 HOURS AS NEEDED FOR PAIN Patient not taking: No sig reported 03/05/19   [name], MD  hydrocortisone (ANUSOL-HC) 25 MG suppository Place 1 suppository (25 mg total) rectally 2 (two) times daily. Patient not taking: Reported on 03/14/2019 08/20/18   [place] Donnamarie Poag, DO  MAGNESIUM-OXIDE 400 (241.3 Mg) MG tablet TAKE 1 TABLET BY MOUTH TWICE DAILY Patient not taking: No sig reported 12/07/18   [name], MD  pantoprazole (PROTONIX) 40 MG tablet TAKE 1 TABLET BY MOUTH TWICE DAILY Patient not taking: No sig reported 12/07/18   [name], MD   Physical Exam: Vitals:   03/14/19 1330 03/14/19 1400 03/14/19 1412 03/14/19 1518  BP: 134/68 (!) 141/98  (!) 164/60  Pulse: 88 89  97  Resp: (!) 32 (!) 26  (!) 26  Temp:      TempSrc:      SpO2: 95% 96% 95% 97%  Weight:      Height:        Wt Readings from Last 3 Encounters:  03/14/19 (!) 161.5 kg  03/13/19 (!) 161.5 kg  03/08/19 (!) 162.7 kg    Constitutional normal appearing obese female, uncomfortable but in no acute distress Eyes: EOMI, anicteric, normal conjunctivae ENMT: Oropharynx with moist mucous membranes, normal dentition Neck: FROM,  Cardiovascular: RRR no MRGs, with no peripheral edema Respiratory: Increased respiratory effort on 3 L, no accessory muscle usage, no conversational dyspnea, diffuse wheezing heard throughout lung fields on anterior chest Abdomen: Soft,non-tender,  Musculoskeletal: no clubbing / cyanosis. No joint  deformity upper and lower extremities. Good ROM, no contractures. Normal muscle tone. Skin:       Neurologic: Grossly no focal neuro deficit. Psychiatric:Appropriate affect, and mood. Mental status AAOx3          Labs on Admission:  Basic Metabolic Panel: Recent Labs  Lab 03/13/19 0150 03/14/19 0750  NA 142 140  K 3.4* 3.2*  CL 105 103  CO2 27 26  GLUCOSE 108* 142*  BUN 12  16  CREATININE 0.58 0.57  CALCIUM 9.1 8.6*   Liver Function Tests: Recent Labs  Lab 03/13/19 0150 03/14/19 0750  AST 30 19  ALT 26 23  ALKPHOS 86 87  BILITOT 0.6 0.8  PROT 8.0 7.9  ALBUMIN 3.6 3.6   No results for input(s): LIPASE, AMYLASE in the last 168 hours. No results for input(s): AMMONIA in the last 168 hours. CBC: Recent Labs  Lab 03/07/19 1806 03/08/19 0328 03/13/19 0150 03/14/19 0750  WBC 9.7 9.6 11.9* 11.2*  NEUTROABS  --   --  9.2* 9.8*  HGB 8.9* 9.4* 10.6* 10.5*  HCT 29.8* 32.1* 36.0 36.4  MCV 90.0 90.2 92.1 94.8  PLT 410* 413* 571* 524*   Cardiac Enzymes: No results for input(s): CKTOTAL, CKMB, CKMBINDEX, TROPONINI in the last 168 hours.  BNP (last 3 results) Recent Labs    12/20/18 1340 02/26/19 0856 03/14/19 0750  BNP 27.8 34.5 68.6    ProBNP (last 3 results) No results for input(s): PROBNP in the last 8760 hours.  CBG: No results for input(s): GLUCAP in the last 168 hours.  Radiological Exams on Admission: Dg Chest Portable 1 View  Result Date: 03/14/2019 CLINICAL DATA:  Shortness of breath. EXAM: PORTABLE CHEST 1 VIEW COMPARISON:  Radiographs of February 27, 2019. FINDINGS: Stable cardiomegaly. Atherosclerosis of thoracic aorta is noted. No pneumothorax or pleural effusion is noted. Probable central pulmonary vascular congestion is noted. Mild right midlung subsegmental atelectasis or scarring is noted. Mild left lingular subsegmental atelectasis or scarring is noted. Bony thorax is unremarkable. Metallic density is seen projected over the superior  mediastinum which may represent overlying artifact. IMPRESSION: Stable cardiomegaly with probable central pulmonary vascular congestion. Mild bilateral subsegmental atelectasis or scarring is noted. Aortic Atherosclerosis (ICD10-I70.0). Electronically Signed   By: Marijo Conception M.D.   On: 03/14/2019 09:27    EKG: Independently reviewed. unchanged from previous tracings.  Assessment/Plan Active Problems:   Morbid obesity (HCC)   HYPERTENSION, BENIGN SYSTEMIC   Hematoma   Chronic anticoagulation   History of hypertension   Chronic respiratory failure with hypoxia (HCC)   Heart failure with preserved ejection fraction (HCC)   Obesity hypoventilation syndrome ([place])   History of pulmonary embolus (PE)   Acute on chronic respiratory failure with hypoxemia (HCC)    Acute on chronic hypoxic respiratory failure, suspect multifactorial etiology.  SPO2 67% on 2 L (home regimen) required increased to 3 L likely combination of CHF flare, COPD exacerbation.  Some concern for potential PE given patient was off Eliquis, has prior history of PE, and no significant improvement on inhaler therapy/steroids.  Obtain CTA PE protocol, resume home Eliquis given stable hemoglobin, scheduled inhalers/steroids and Lasix.  Continue supplemental O2 given increased respiratory effort and prior oxygen  desaturation.    Chest tightness.  Likely due to above respiratory issu3.  Doubt any cardiac involvement given EKG with no acute ischemic etiologies has sensitive troponin trend unremarkable     Acute COPD exacerbation.  Worsening dyspnea, nonproductive cough, increased oxygen requirements, diffuse wheezing.  Scheduled duo nebs, IV Solu-Medrol, flutter valve, incentive furniture, scheduled Mucinex close monitoring respiratory status.   Acute on chronic CHF with preserved EF.  BNP within normal limits but unreliable in the setting of obesity, DOE, pulmonary vascular congestion on chest x-ray concerning for flare.  TTE  (3/20) EF 55 to 60%.  Nave to Lasix, will give Lasix 40 mg x 1, monitor output, daily weights, fluid restrict, low-sodium diet.   Atrial fibrillation.  Currently normal sinus rhythm, rate controlled.  Continue home diltiazem, flecainide, Lopressor.  Monitor on telemetry, resume Eliquis   History of PE (2015).  Other potential etiologies of shortness of breath however given patient was off anticoagulation will obtain CTA PE.  Continue supplemental O2 and address above issues.  Continue Eliquis   HTN, slightly above goal.  SBP's 164.  Isolated to holding home BP meds.  Resuming home Lopressor, diltiazem.  Closely monitor   Obesity hypoventilation syndrome/OSA.  No formal diagnosis of OSA.  Needs outpatient sleep study.  Continue supplemental oxygen, monitor respiratory status.   Abdominal wall hematoma, stable.  Some slight pain with palpation.  Denies any recurrent bright red blood per rectum since recent ED visit.  Hemoglobin stable, monitor CBC.   Consultants: None Code Status: DNR, discussed with patient on day of admission DVT Prophylaxis: Eliquis Family Communication: No family at bedside Disposition Plan: Expect greater than 2 midnight stays as inpatient given increased oxygen requirement, significant dyspnea/respiratory effort/distress, requiring IV Lasix, IV steroids/inhalers, and imaging for further evaluation    Desiree Hane MD [facility]  Pager [phone]  If 7PM-7AM, please contact night-coverage [email or web address] Password [facility]  03/14/2019, 3:35 PM

## 2019-03-14 NOTE — ED Notes (Signed)
Pt transported to CT ?

## 2019-03-14 NOTE — ED Notes (Signed)
IV team at bedside 

## 2019-03-15 ENCOUNTER — Other Ambulatory Visit: Payer: Self-pay | Admitting: *Deleted

## 2019-03-15 DIAGNOSIS — R0602 Shortness of breath: Secondary | ICD-10-CM

## 2019-03-15 DIAGNOSIS — E876 Hypokalemia: Secondary | ICD-10-CM

## 2019-03-15 LAB — CBC
HCT: 33.6 % — ABNORMAL LOW (ref 36.0–46.0)
Hemoglobin: 9.8 g/dL — ABNORMAL LOW (ref 12.0–15.0)
MCH: 27.1 pg (ref 26.0–34.0)
MCHC: 29.2 g/dL — ABNORMAL LOW (ref 30.0–36.0)
MCV: 92.8 fL (ref 80.0–100.0)
Platelets: 553 10*3/uL — ABNORMAL HIGH (ref 150–400)
RBC: 3.62 MIL/uL — ABNORMAL LOW (ref 3.87–5.11)
RDW: 20.8 % — ABNORMAL HIGH (ref 11.5–15.5)
WBC: 10.2 10*3/uL (ref 4.0–10.5)
nRBC: 0 % (ref 0.0–0.2)

## 2019-03-15 LAB — BASIC METABOLIC PANEL
Anion gap: 12 (ref 5–15)
BUN: 20 mg/dL (ref 8–23)
CO2: 26 mmol/L (ref 22–32)
Calcium: 8.4 mg/dL — ABNORMAL LOW (ref 8.9–10.3)
Chloride: 103 mmol/L (ref 98–111)
Creatinine, Ser: 0.78 mg/dL (ref 0.44–1.00)
GFR calc Af Amer: >60 mL/min (ref >60)
GFR calc non Af Amer: >60 mL/min (ref >60)
Glucose, Bld: 202 mg/dL — ABNORMAL HIGH (ref 70–99)
Potassium: 3.3 mmol/L — ABNORMAL LOW (ref 3.5–5.1)
Sodium: 141 mmol/L (ref 135–145)

## 2019-03-15 MED ORDER — VENLAFAXINE HCL ER 37.5 MG PO CP24
187.5000 mg | ORAL_CAPSULE | Freq: Every day | ORAL | Status: DC
  Administered 2019-03-15 – 2019-03-22 (×8): 187.5 mg via ORAL
  Filled 2019-03-15 (×8): qty 1

## 2019-03-15 MED ORDER — GUAIFENESIN-DM 100-10 MG/5ML PO SYRP
5.0000 mL | ORAL_SOLUTION | ORAL | Status: DC | PRN
  Administered 2019-03-15 – 2019-03-17 (×4): 5 mL via ORAL
  Filled 2019-03-15 (×4): qty 10

## 2019-03-15 MED ORDER — FUROSEMIDE 10 MG/ML IJ SOLN
40.0000 mg | Freq: Once | INTRAMUSCULAR | Status: AC
  Administered 2019-03-15: 10:00:00 40 mg via INTRAVENOUS
  Filled 2019-03-15: qty 4

## 2019-03-15 MED ORDER — POTASSIUM CHLORIDE CRYS ER 20 MEQ PO TBCR
40.0000 meq | EXTENDED_RELEASE_TABLET | Freq: Once | ORAL | Status: AC
  Administered 2019-03-15: 10:00:00 40 meq via ORAL
  Filled 2019-03-15: qty 2

## 2019-03-15 MED ORDER — VENLAFAXINE HCL ER 37.5 MG PO CP24: 187.5000 mg | ORAL_CAPSULE | Freq: Every day | ORAL | Status: DC

## 2019-03-15 MED ORDER — FUROSEMIDE 10 MG/ML IJ SOLN
40.0000 mg | Freq: Once | INTRAMUSCULAR | Status: AC
  Administered 2019-03-15: 15:00:00 40 mg via INTRAVENOUS
  Filled 2019-03-15: qty 4

## 2019-03-15 NOTE — Patient Outreach (Signed)
[facility]) Care Management  03/15/2019  [name] White Oct 31, 1955 [id number]   CSW was scheduled to outreach to patient today to follow-up regarding social work services and resources, but noted that patient currently resides at [facility]  After thorough review of patient's EMR ([place] [facility]) in [place], CSW learned that patient presented to the Emergency Department at [facility] twice this week (Wednesday, March 13, 2019 & Thursday, March 14, 2019).  On the 16th, patient reported seeing bright red blood in her stool, and on the 17th, patient reported feeling short-of-breath, worse with exertion, and experiencing chest tightness.  CSW will continue to follow patient while hospitalized, then resume providing community case management and social work services to patient upon discharge.  [name], BSW, MSW, LCSW  Licensed Education officer, [facility]  Mailing [place] [address], [place], [place] [address] Physical Address-[address], [place], [place] [address] Toll Free Main # [phone] Fax # [phone] Cell # [phone]  Office # [phone] [name]

## 2019-03-15 NOTE — Progress Notes (Signed)
K 3.3 paged night coverage

## 2019-03-15 NOTE — Progress Notes (Signed)
[facility]  PROGRESS NOTE  DEBORA MEKONNEN [id number] DOB: Aug 28, 1955 DOA: 03/14/2019 PCP: [name], MD  Brief History   [name] White is a 63 y.o. female with medical history significant for paroxysmal A. fib on eliquis, history of PE (2015),COPD, chronic respiratory failure on 2 L O2 at home, asthma, MDD, anxiety and reported OSA (n formal sleep study)who presents on 03/14/2019 with progressive dyspnea, nonproductive cough over the past 1 to 2 days.  She states her shortness of breath progressively worsened throughout the day and shortness with the most while walking back and forth in her home.  It did not improve with her home inhalers.  She also noticed some associated chest tightness.  She measured her oxygen at home and found it to be in the low 80s.  Denied any fevers, no nausea, no vomiting, no sick contacts.  Denies any new lower leg swelling.  Does not weigh herself daily.  Does not take any Lasix for heart failure.  Patient was recently discharged (admission 9/8-9/10) for abdminal wall hematoma sustained after a fall while on anticoagulation for PE/AF.  During that admission, she reported BRBPR, and epiode of melena but no bleeding durng the admission ( did have positive FOBT) patient was switched from Xarelto to eliquis on discharge with outpatient colonoscopy arranged given stable hemoglobin of 10.2.   She was recently seen in the ED on 9/15 for concerns for bright red blood per rectum related to her recently diagnosed abdominal wall hematoma in the setting of anticoagulation therapy.  She instructed to hold off on further use of her Eliquis and resume in 2 days.  Patient has not taken Eliquis since the evening of 9/16    ED Course: Afebrile, respiratory rate 22, SPO2 67% on 2 L increased to 3 L with normal oxygen saturation.  Lab work notable for potassium 3.2, WBC 11.2, hemoglobin 10.5, BNP 68, COVID test negative, high-sensitivity from 8, chest x-ray showed  cardiomegaly with probable central pulmonary vascular congestion with atelectasis.    A & P     Acute on chronic hypoxic respiratory failure, suspect multifactorial etiology.    On admission SPO2 67% on 2 L (home regimen) required increased to 3 L likely combination of CHF flare, COPD exacerbation.  PE ruled out with CTA imaging though cannot definitively rule out small blood clots based off respiratory abnormalities.  We will continue IV diuresis for CHF and scheduled inhalers for COPD, close monitoring respiratory status wean O2 as able.   Chest tightness.  Likely due to above respiratory issues above.  Doubt any cardiac involvement given EKG with no acute ischemic etiologies has sensitive troponin trend unremarkable    Acute COPD exacerbation.  Worsening dyspnea, nonproductive cough, increased oxygen requirements, diffuse wheezing-which is improved from admission but still notably short of breath with minimal exertion.    Continue scheduled duo nebs, IV Solu-Medrol, flutter valve, incentive spirometer, scheduled Mucinex, close monitoring respiratory status.   Acute on chronic CHF with preserved EF.  BNP within normal limits but unreliable in the setting of obesity, DOE, pulmonary vascular congestion on chest x-ray concerning for flare.  TTE (3/20) EF 55 to 60%.  Nave to Lasix, net negative 1L in 24 hours with IV lasix 40 mg x 1 but still very SOB will increase Lasix 40 mg twice daily, monitor output, daily weights, fluid restrict, low-sodium diet.   Atrial fibrillation.  Currently normal sinus rhythm, rate controlled.  Continue home diltiazem, flecainide, Lopressor.  Monitor on telemetry,  resume Eliquis   History of PE (2015).  Other potential etiologies of shortness of breath seen more likely CTA chest negative for overt clot disease, unable to rule out smaller vessels given respiratory artifact, closely monitor. patient tolerating her home Eliquis   Hypokalemia.  Likely in the  setting of IV diuresis, closely monitor potassium and replete as necessary.   HTN, at goal.   home Lopressor, diltiazem.  Closely monitor   Obesity hypoventilation syndrome/OSA.  No formal diagnosis of OSA.  Needs outpatient sleep study.  Continue supplemental oxygen, monitor respiratory status.   Abdominal wall hematoma, stable.  Some slight pain with palpation.  Denies any recurrent bright red blood per rectum since recent ED visit.  Hemoglobin stable, monitor CBC.     DVT prophylaxis: Home Eliquis Code Status: DNR discussed on day of admission Family Communication: No family at bedside Disposition Plan: Needs continued inpatient stay for continued IV diuresis and scheduled inhalers/steroids for greater improvement in respiratory status given persistence of symptoms with minimal exertion, closely monitor status    [facility] Direct contact: see [email or web address] (further directions at bottom of note if needed) 7PM-7AM contact night coverage as at bottom of note 03/15/2019, 5:43 PM  LOS: 1 day   Consultants   None  Procedures   None  Antibiotics   None  Interval History/Subjective  Feels breathing is better than yesterday Does note that she still gets very short of breath with minimal movement  Objective   Vitals:  Vitals:   03/15/19 1213 03/15/19 1414  BP: 119/76   Pulse: 96   Resp: 20   Temp: 98.2 F (36.8 C)   SpO2: 95% 97%    Exam:  Awake Alert, Oriented X 3, No new F.N deficits, Normal affect [place].AT Unable to assess JVD given body habitus, no peripheral edema Symmetrical Chest wall movement, increased respiratory effort, abdominal wall breathing, no overt accessory muscle usage, scant wheezing, on 2 L nasal cannula O2 RRR,No Gallops,Rubs or new Murmurs,  +ve B.Sounds, Abd Soft, No tenderness,  No rebound - guarding or rigidity. Stable bruising in upper abdomen   I have personally reviewed the following:   Data Reviewed: Basic Metabolic  Panel: Recent Labs  Lab 03/13/19 0150 03/14/19 0750 03/15/19 0400  NA 142 140 141  K 3.4* 3.2* 3.3*  CL 105 103 103  CO2 27 26 26   GLUCOSE 108* 142* 202*  BUN 12 16 20   CREATININE 0.58 0.57 0.78  CALCIUM 9.1 8.6* 8.4*   Liver Function Tests: Recent Labs  Lab 03/13/19 0150 03/14/19 0750  AST 30 19  ALT 26 23  ALKPHOS 86 87  BILITOT 0.6 0.8  PROT 8.0 7.9  ALBUMIN 3.6 3.6   No results for input(s): LIPASE, AMYLASE in the last 168 hours. No results for input(s): AMMONIA in the last 168 hours. CBC: Recent Labs  Lab 03/13/19 0150 03/14/19 0750 03/15/19 0400  WBC 11.9* 11.2* 10.2  NEUTROABS 9.2* 9.8*  --   HGB 10.6* 10.5* 9.8*  HCT 36.0 36.4 33.6*  MCV 92.1 94.8 92.8  PLT 571* 524* 553*   Cardiac Enzymes: No results for input(s): CKTOTAL, CKMB, CKMBINDEX, TROPONINI in the last 168 hours. BNP (last 3 results) Recent Labs    12/20/18 1340 02/26/19 0856 03/14/19 0750  BNP 27.8 34.5 68.6    ProBNP (last 3 results) No results for input(s): PROBNP in the last 8760 hours.  CBG: No results for input(s): GLUCAP in the last 168 hours.  Recent Results (from the  past 240 hour(s))  SARS CORONAVIRUS 2 (TAT 6-24 HRS) Nasopharyngeal Nasopharyngeal Swab     Status: None   Collection Time: 03/06/19  3:15 AM   Specimen: Nasopharyngeal Swab  Result Value Ref Range Status   SARS Coronavirus 2 NEGATIVE NEGATIVE Final    Comment: (NOTE) SARS-CoV-2 target nucleic acids are NOT DETECTED. The SARS-CoV-2 RNA is generally detectable in upper and lower respiratory specimens during the acute phase of infection. Negative results do not preclude SARS-CoV-2 infection, do not rule out co-infections with other pathogens, and should not be used as the sole basis for treatment or other patient management decisions. Negative results must be combined with clinical observations, patient history, and epidemiological information. The expected result is Negative. Fact Sheet for  Patients: SugarRoll.be Fact Sheet for Healthcare Providers: https://www.woods-mathews.com/ This test is not yet approved or cleared by the [place] FDA and  has been authorized for detection and/or diagnosis of SARS-CoV-2 by FDA under an Emergency Use Authorization (EUA). This EUA will remain  in effect (meaning this test can be used) for the duration of the COVID-19 declaration under Section 56 4(b)(1) of the Act, 21 U.S.C. section 360bbb-3(b)(1), unless the authorization is terminated or revoked sooner. Performed at [facility], [place] [address] [place], [place] [address]   SARS Coronavirus 2 [facility] order, Performed in [facility] hospital lab) Nasopharyngeal Nasopharyngeal Swab     Status: None   Collection Time: 03/14/19  7:35 AM   Specimen: Nasopharyngeal Swab  Result Value Ref Range Status   SARS Coronavirus 2 NEGATIVE NEGATIVE Final    Comment: (NOTE) If result is NEGATIVE SARS-CoV-2 target nucleic acids are NOT DETECTED. The SARS-CoV-2 RNA is generally detectable in upper and lower  respiratory specimens during the acute phase of infection. The lowest  concentration of SARS-CoV-2 viral copies this assay can detect is 250  copies / mL. A negative result does not preclude SARS-CoV-2 infection  and should not be used as the sole basis for treatment or other  patient management decisions.  A negative result may occur with  improper specimen collection / handling, submission of specimen other  than nasopharyngeal swab, presence of viral mutation(s) within the  areas targeted by this assay, and inadequate number of viral copies  (<250 copies / mL). A negative result must be combined with clinical  observations, patient history, and epidemiological information. If result is POSITIVE SARS-CoV-2 target nucleic acids are DETECTED. The SARS-CoV-2 RNA is generally detectable in upper and lower  respiratory specimens dur ing the  acute phase of infection.  Positive  results are indicative of active infection with SARS-CoV-2.  Clinical  correlation with patient history and other diagnostic information is  necessary to determine patient infection status.  Positive results do  not rule out bacterial infection or co-infection with other viruses. If result is PRESUMPTIVE POSTIVE SARS-CoV-2 nucleic acids MAY BE PRESENT.   A presumptive positive result was obtained on the submitted specimen  and confirmed on repeat testing.  While 2019 novel coronavirus  (SARS-CoV-2) nucleic acids may be present in the submitted sample  additional confirmatory testing may be necessary for epidemiological  and / or clinical management purposes  to differentiate between  SARS-CoV-2 and other Sarbecovirus currently known to infect humans.  If clinically indicated additional testing with an alternate test  methodology [phone]) is advised. The SARS-CoV-2 RNA is generally  detectable in upper and lower respiratory sp ecimens during the acute  phase of infection. The expected result is Negative.  Fact Sheet for Patients:  StrictlyIdeas.no Fact Sheet for Healthcare Providers: BankingDealers.co.za This test is not yet approved or cleared by the [place] FDA and has been authorized for detection and/or diagnosis of SARS-CoV-2 by FDA under an Emergency Use Authorization (EUA).  This EUA will remain in effect (meaning this test can be used) for the duration of the COVID-19 declaration under Section 564(b)(1) of the Act, 21 U.S.C. section 360bbb-3(b)(1), unless the authorization is terminated or revoked sooner. Performed at [facility], [place] [address], [place], [place] [address]      Studies: Ct Angio Chest Pe W Or Wo Contrast  Result Date: 03/14/2019 CLINICAL DATA:  Shortness of breath. EXAM: CT ANGIOGRAPHY CHEST WITH CONTRAST TECHNIQUE: Multidetector CT imaging of the  chest was performed using the standard protocol during bolus administration of intravenous contrast. Multiplanar CT image reconstructions and MIPs were obtained to evaluate the vascular anatomy. CONTRAST:  147mL OMNIPAQUE IOHEXOL 350 MG/ML SOLN COMPARISON:  CT scan of December 26, 2018. FINDINGS: Cardiovascular: There is no evidence of large central pulmonary embolus. However, due to persistent respiratory motion artifact, smaller emboli cannot be excluded in the peripheral aspects of the pulmonary arteries. There is no evidence of thoracic aortic dissection or aneurysm. Mild cardiomegaly is noted. No pericardial effusion is noted. Mediastinum/Nodes: Status post right thyroidectomy. No adenopathy is noted. Esophagus is unremarkable. Lungs/Pleura: No pneumothorax or pleural effusion is noted. Minimal bilateral posterior basilar subsegmental atelectasis is noted. Upper Abdomen: No acute abnormality. Musculoskeletal: Probable soft tissue contusion seen in the subcutaneous tissues of the epigastric region. No acute or significant osseous findings. Review of the MIP images confirms the above findings. IMPRESSION: No definite evidence of large central pulmonary embolus seen in the main pulmonary artery or main portions of the right and left pulmonary arteries. However, due to persistent respiratory motion artifact, smaller and more peripheral pulmonary emboli cannot be excluded on the basis of this exam. Probable soft tissue contusions seen in the subcutaneous tissues of the epigastric region of the abdomen. Electronically Signed   By: Marijo Conception M.D.   On: 03/14/2019 16:43   Dg Chest Portable 1 View  Result Date: 03/14/2019 CLINICAL DATA:  Shortness of breath. EXAM: PORTABLE CHEST 1 VIEW COMPARISON:  Radiographs of February 27, 2019. FINDINGS: Stable cardiomegaly. Atherosclerosis of thoracic aorta is noted. No pneumothorax or pleural effusion is noted. Probable central pulmonary vascular congestion is noted. Mild  right midlung subsegmental atelectasis or scarring is noted. Mild left lingular subsegmental atelectasis or scarring is noted. Bony thorax is unremarkable. Metallic density is seen projected over the superior mediastinum which may represent overlying artifact. IMPRESSION: Stable cardiomegaly with probable central pulmonary vascular congestion. Mild bilateral subsegmental atelectasis or scarring is noted. Aortic Atherosclerosis (ICD10-I70.0). Electronically Signed   By: Marijo Conception M.D.   On: 03/14/2019 09:27    Scheduled Meds:  apixaban  5 mg Oral BID   cycloSPORINE  1 drop Both Eyes BID   diltiazem  120 mg Oral QHS   famotidine  20 mg Oral Daily   flecainide  100 mg Oral BID   fluticasone  2 spray Each Nare QHS   guaiFENesin  600 mg Oral BID   ipratropium-albuterol  3 mL Nebulization Q6H   loratadine  10 mg Oral Daily   methylPREDNISolone (SOLU-MEDROL) injection  60 mg Intravenous Q6H   metoprolol tartrate  25 mg Oral BID   venlafaxine XR  187.5 mg Oral Q breakfast   Continuous Infusions:  Active  Problems:   Morbid obesity (HCC)   HYPERTENSION, BENIGN SYSTEMIC   Atrial flutter (HCC)   Hematoma   Chronic anticoagulation   History of hypertension   Chronic respiratory failure with hypoxia (HCC)   COPD with acute exacerbation (HCC)   Heart failure with preserved ejection fraction (HCC)   Obesity hypoventilation syndrome ([place])   History of pulmonary embolus (PE)   Acute on chronic respiratory failure with hypoxemia (HCC)   Acute on chronic heart failure with preserved ejection fraction ([place])      Desiree Hane  [facility]

## 2019-03-15 NOTE — TOC Initial Note (Signed)
Transition of Care [facility]) - Initial/Assessment Note    Patient Details  Name: [name] White MRN: [id number] Date of Birth: 10-30-1955  Transition of Care [facility]) CM/SW Contact:    Purcell Mouton, RN Phone Number: 03/15/2019, 10:42 AM  Clinical Narrative:                 Pt is active with [facility].   Expected Discharge Plan: [place]     Patient Goals and CMS Choice Patient states their goals for this hospitalization and ongoing recovery are:: To get better   Choice offered to / list presented to : Patient  Expected Discharge Plan and Services Expected Discharge Plan: [place]   Discharge Planning Services: CM Consult Post Acute Care Choice: [place] arrangements for the past 2 months: Single Family Home Expected Discharge Date: (unknown)                         HH Arranged: RN, PT, OT, Nurse's Aide [place] Agency: [place] ([place]) Date HH Agency Contacted: 03/15/19 Time HH Agency Contacted: 66 Representative spoke with at [place]: Santiago Glad, RN  Prior Living Arrangements/Services Living arrangements for the past 2 months: [place] with:: Self Patient language and need for interpreter reviewed:: No Do you feel safe going back to the place where you live?: Yes          Current home services: Homehealth aide, Home OT, Home PT, Home RN    Activities of Daily Living Home Assistive Devices/Equipment: Cane (specify quad or straight), Oxygen, Nebulizer ADL Screening (condition at time of admission) Patient's cognitive ability adequate to safely complete daily activities?: Yes Is the patient deaf or have difficulty hearing?: No Does the patient have difficulty seeing, even when wearing glasses/contacts?: No Does the patient have difficulty concentrating, remembering, or making decisions?: No Patient able to express need for assistance with ADLs?: Yes Does the patient  have difficulty dressing or bathing?: No Independently performs ADLs?: No Communication: Independent Dressing (OT): Needs assistance Is this a change from baseline?: Pre-admission baseline Grooming: Needs assistance Is this a change from baseline?: Pre-admission baseline Feeding: Independent Is this a change from baseline?: Pre-admission baseline Bathing: Needs assistance Is this a change from baseline?: Pre-admission baseline Toileting: Needs assistance Is this a change from baseline?: Pre-admission baseline In/Out Bed: Needs assistance Is this a change from baseline?: Pre-admission baseline Walks in Home: Independent with device (comment) Is this a change from baseline?: Pre-admission baseline Does the patient have difficulty walking or climbing stairs?: Yes(secondary to shortness of breath and weakness) Weakness of Legs: Both Weakness of Arms/Hands: None  Permission Sought/Granted Permission sought to share information with : Case Manager                Emotional Assessment Appearance:: Appears stated age     Orientation: : Oriented to Self, Oriented to Place, Oriented to  Time, Oriented to Situation      Admission diagnosis:  SOB (shortness of breath) [R06.02] Hypoxia [R09.02] COPD exacerbation ([place]) [J44.1] Patient Active Problem List   Diagnosis Date Noted  . Acute on chronic respiratory failure with hypoxemia ([place]) 03/14/2019  . Acute on chronic heart failure with preserved ejection fraction ([place]) 03/14/2019  . Acquired thrombophilia ([place]) due to A-Fib 03/07/2019  . BRBPR (bright red blood per rectum) 03/06/2019  . Heart failure with preserved ejection fraction ([place]) 03/06/2019  . Active bleeding 03/06/2019  .  Obesity hypoventilation syndrome ([place]) 03/06/2019  . Abdominal wall hematoma 03/06/2019  . Lower GI bleed   . High risk social situation   . Urge incontinence 01/27/2019  . Homelessness 12/30/2018  . Reactive airway disease 09/10/2018  . COPD with  acute exacerbation ([place]) 08/07/2018  . Sleep apnea 07/05/2018  . Chronic respiratory failure with hypoxia ([place]) 07/05/2018  . Hematoma 11/27/2017  . Severe episode of recurrent major depressive disorder, without psychotic features ([place])   . MDD (major depressive disorder), recurrent episode, severe ([place]) 09/07/2015  . Atrial flutter ([place]) 07/29/2015  . Asthma 11/12/2014  . History of hypertension 11/05/2014  . Paroxysmal atrial fibrillation ([place]) 11/03/2014  . Chronic anticoagulation 11/03/2014  . Major depressive disorder 11/03/2014  . History of pulmonary embolus (PE) 11/03/2014  . MDD (major depressive disorder), recurrent severe, without psychosis ([place]) 03/05/2014  . Vocal cord dysfunction 11/12/2013  . Mixed headache 01/06/2012  . Hot flashes 04/22/2011  . OVERACTIVE BLADDER 04/18/2008  . Osteoarthritis of both knees 04/18/2008  . Osteoarthritis involving multiple joints on both sides of body 09/14/2007  . Morbid obesity ([place]) 08/24/2006  . RESTLESS LEGS SYNDROME 08/24/2006  . HYPERTENSION, BENIGN SYSTEMIC 08/24/2006  . RHINITIS, ALLERGIC 08/24/2006  . GASTROESOPHAGEAL REFLUX, NO ESOPHAGITIS 08/24/2006   PCP:  [name], MD Pharmacy:   [facility], [place] [address] [place] [address] Phone: [phone] Fax: [phone]     Social Determinants of Health (SDOH) Interventions    Readmission Risk Interventions Readmission Risk Prevention Plan 03/08/2019  Transportation Screening Complete  Medication Review ([place]) Complete  PCP or Specialist appointment within 3-5 days of discharge Complete  HRI or [place] Complete  SW Recovery Care/Counseling Consult Complete  [place] Not Applicable  Some recent data might be hidden

## 2019-03-15 NOTE — Consult Note (Signed)
   [facility] CM Inpatient Consult   03/15/2019  [name] White Sep 22, 1955 [id number]    Chart reviewed due to readmission less than 30 days and extreme readmission risk score, 38%. Patient is currently active with [name] social worker. Will continue to follow for progression and disposition plans.  Our community based plan of care has focused on disease management and community resource support.   Of note, [facility] Care Management services does not replace or interfere with any services that are needed or arranged by inpatient case management or social work.     Netta Cedars, MSN, [name] Liaison Nurse Mobile Phone [phone]  Toll free office [phone]

## 2019-03-16 LAB — CBC
HCT: 34 % — ABNORMAL LOW (ref 36.0–46.0)
Hemoglobin: 9.8 g/dL — ABNORMAL LOW (ref 12.0–15.0)
MCH: 27.1 pg (ref 26.0–34.0)
MCHC: 28.8 g/dL — ABNORMAL LOW (ref 30.0–36.0)
MCV: 94.2 fL (ref 80.0–100.0)
Platelets: 571 10*3/uL — ABNORMAL HIGH (ref 150–400)
RBC: 3.61 MIL/uL — ABNORMAL LOW (ref 3.87–5.11)
RDW: 21.1 % — ABNORMAL HIGH (ref 11.5–15.5)
WBC: 15.1 10*3/uL — ABNORMAL HIGH (ref 4.0–10.5)
nRBC: 0.1 % (ref 0.0–0.2)

## 2019-03-16 LAB — BASIC METABOLIC PANEL
Anion gap: 9 (ref 5–15)
BUN: 36 mg/dL — ABNORMAL HIGH (ref 8–23)
CO2: 28 mmol/L (ref 22–32)
Calcium: 8.3 mg/dL — ABNORMAL LOW (ref 8.9–10.3)
Chloride: 103 mmol/L (ref 98–111)
Creatinine, Ser: 0.82 mg/dL (ref 0.44–1.00)
GFR calc Af Amer: >60 mL/min (ref >60)
GFR calc non Af Amer: >60 mL/min (ref >60)
Glucose, Bld: 171 mg/dL — ABNORMAL HIGH (ref 70–99)
Potassium: 4 mmol/L (ref 3.5–5.1)
Sodium: 140 mmol/L (ref 135–145)

## 2019-03-16 MED ORDER — FUROSEMIDE 10 MG/ML IJ SOLN
40.0000 mg | Freq: Two times a day (BID) | INTRAMUSCULAR | Status: DC
  Administered 2019-03-16 – 2019-03-20 (×8): 40 mg via INTRAVENOUS
  Filled 2019-03-16 (×8): qty 4

## 2019-03-16 MED ORDER — CYCLOBENZAPRINE HCL 10 MG PO TABS
10.0000 mg | ORAL_TABLET | Freq: Three times a day (TID) | ORAL | Status: DC | PRN
  Administered 2019-03-18 – 2019-03-20 (×2): 10 mg via ORAL
  Filled 2019-03-16 (×2): qty 1

## 2019-03-16 NOTE — Progress Notes (Signed)
[facility]  PROGRESS NOTE  [name] White [id number] DOB: 02/16/1956 DOA: 03/14/2019 PCP: [name], MD  Brief History   [name] White is a 63 y.o. female with medical history significant for paroxysmal A. fib on eliquis, history of PE (2015),COPD, chronic respiratory failure on 2 L O2 at home, asthma, MDD, anxiety and reported OSA (n formal sleep study)who presents on 03/14/2019 with progressive dyspnea, nonproductive cough over the past 1 to 2 days.  She states her shortness of breath progressively worsened throughout the day and shortness with the most while walking back and forth in her home.  It did not improve with her home inhalers.  She also noticed some associated chest tightness.  She measured her oxygen at home and found it to be in the low 80s.  Denied any fevers, no nausea, no vomiting, no sick contacts.  Denies any new lower leg swelling.  Does not weigh herself daily.  Does not take any Lasix for heart failure.  Patient was recently discharged (admission 9/8-9/10) for abdminal wall hematoma sustained after a fall while on anticoagulation for PE/AF.  During that admission, she reported BRBPR, and epiode of melena but no bleeding durng the admission ( did have positive FOBT) patient was switched from Xarelto to eliquis on discharge with outpatient colonoscopy arranged given stable hemoglobin of 10.2.   She was recently seen in the ED on 9/15 for concerns for bright red blood per rectum related to her recently diagnosed abdominal wall hematoma in the setting of anticoagulation therapy.  She instructed to hold off on further use of her Eliquis and resume in 2 days.  Patient has not taken Eliquis since the evening of 9/16    ED Course: Afebrile, respiratory rate 22, SPO2 67% on 2 L increased to 3 L with normal oxygen saturation.  Lab work notable for potassium 3.2, WBC 11.2, hemoglobin 10.5, BNP 68, COVID test negative, high-sensitivity from 8, chest x-ray showed  cardiomegaly with probable central pulmonary vascular congestion with atelectasis.    A & P     Acute on chronic hypoxic respiratory failure, suspect multifactorial etiology.    On admission SPO2 67% on 2 L (home regimen) required increased to 3 L likely combination of CHF flare, COPD exacerbation.  PE ruled out with CTA imaging though cannot definitively rule out small blood clots based off respiratory abnormalities.  We will continue IV diuresis for CHF and scheduled inhalers for COPD, close monitoring respiratory status wean O2 as able.   Chest tightness.  Likely due to above respiratory issues above.  Doubt any cardiac involvement given EKG with no acute ischemic etiologies has sensitive troponin trend unremarkable    Acute COPD exacerbation.  Worsening dyspnea, nonproductive cough, increased oxygen requirements, diffuse wheezing-which is improved from admission but still notably short of breath with minimal exertion.    Continue scheduled duo nebs, IV Solu-Medrol, flutter valve, incentive spirometer, scheduled Mucinex, close monitoring respiratory status.   Acute on chronic CHF with preserved EF.  BNP within normal limits but unreliable in the setting of obesity, DOE, pulmonary vascular congestion on chest x-ray concerning for flare.  TTE (3/20) EF 55 to 60%.    Still only 1 L out, weight actually went up, still very dyspneic on exam, continue IV Lasix 40 mg twice daily, monitor output, daily weights, fluid restrict, low-sodium diet.   Atrial fibrillation.  Currently normal sinus rhythm, rate controlled.  Continue home diltiazem, flecainide, Lopressor.  Monitor on telemetry, resume Eliquis  History of PE (2015).  Other potential etiologies of shortness of breath seen more likely CTA chest negative for overt clot disease, unable to rule out smaller vessels given respiratory artifact, closely monitor. patient tolerating her home Eliquis   Hypokalemia, resolved.  Likely in the setting  of IV diuresis, closely monitor potassium and replete as necessary.   HTN, at goal.   home Lopressor, diltiazem.  Closely monitor   Obesity hypoventilation syndrome/OSA.  No formal diagnosis of OSA.  Needs outpatient sleep study.  Continue supplemental oxygen, monitor respiratory status.   Abdominal wall hematoma, stable.  Some slight pain with palpation.  Denies any recurrent bright red blood per rectum since recent ED visit.  Hemoglobin stable, monitor CBC.     DVT prophylaxis: Home Eliquis Code Status: DNR discussed on day of admission Family Communication: No family at bedside Disposition Plan: Needs continued inpatient stay for continued IV diuresis and scheduled inhalers/steroids for greater improvement in respiratory status given persistence of symptoms with minimal exertion, closely monitor status    [facility] Direct contact: see [email or web address] (further directions at bottom of note if needed) 7PM-7AM contact night coverage as at bottom of note 03/16/2019, 5:24 PM  LOS: 2 days   Consultants  . None  Procedures  . None  Antibiotics  . None  Interval History/Subjective  Voice is hoarse Still notably short of breath especially with movement  Objective   Vitals:  Vitals:   03/16/19 1250 03/16/19 1415  BP: 131/75   Pulse: 87   Resp: (!) 23   Temp: 97.7 F (36.5 C)   SpO2: 97% 93%    Exam:  Awake Alert, Oriented X 3, No new F.N deficits, Normal affect [place].AT Unable to assess JVD given body habitus, no peripheral edema Symmetrical Chest wall movement,, abdominal breathing, no overt accessory muscle usage, scant wheezing, on 2 L nasal cannula O2 RRR,No Gallops,Rubs or new Murmurs,  +ve B.Sounds, Abd Soft, No tenderness,  No rebound - guarding or rigidity. Stable bruising in upper abdomen    I have personally reviewed the following:   Data Reviewed: Basic Metabolic Panel: Recent Labs  Lab 03/13/19 0150 03/14/19 0750 03/15/19 0400 03/16/19  0339  NA 142 140 141 140  K 3.4* 3.2* 3.3* 4.0  CL 105 103 103 103  CO2 27 26 26 28   GLUCOSE 108* 142* 202* 171*  BUN 12 16 20  36*  CREATININE 0.58 0.57 0.78 0.82  CALCIUM 9.1 8.6* 8.4* 8.3*   Liver Function Tests: Recent Labs  Lab 03/13/19 0150 03/14/19 0750  AST 30 19  ALT 26 23  ALKPHOS 86 87  BILITOT 0.6 0.8  PROT 8.0 7.9  ALBUMIN 3.6 3.6   No results for input(s): LIPASE, AMYLASE in the last 168 hours. No results for input(s): AMMONIA in the last 168 hours. CBC: Recent Labs  Lab 03/13/19 0150 03/14/19 0750 03/15/19 0400 03/16/19 0339  WBC 11.9* 11.2* 10.2 15.1*  NEUTROABS 9.2* 9.8*  --   --   HGB 10.6* 10.5* 9.8* 9.8*  HCT 36.0 36.4 33.6* 34.0*  MCV 92.1 94.8 92.8 94.2  PLT 571* 524* 553* 571*   Cardiac Enzymes: No results for input(s): CKTOTAL, CKMB, CKMBINDEX, TROPONINI in the last 168 hours. BNP (last 3 results) Recent Labs    12/20/18 1340 02/26/19 0856 03/14/19 0750  BNP 27.8 34.5 68.6    ProBNP (last 3 results) No results for input(s): PROBNP in the last 8760 hours.  CBG: No results for input(s): GLUCAP in the last  168 hours.  Recent Results (from the past 240 hour(s))  SARS Coronavirus 2 [facility] order, Performed in [facility] hospital lab) Nasopharyngeal Nasopharyngeal Swab     Status: None   Collection Time: 03/14/19  7:35 AM   Specimen: Nasopharyngeal Swab  Result Value Ref Range Status   SARS Coronavirus 2 NEGATIVE NEGATIVE Final    Comment: (NOTE) If result is NEGATIVE SARS-CoV-2 target nucleic acids are NOT DETECTED. The SARS-CoV-2 RNA is generally detectable in upper and lower  respiratory specimens during the acute phase of infection. The lowest  concentration of SARS-CoV-2 viral copies this assay can detect is 250  copies / mL. A negative result does not preclude SARS-CoV-2 infection  and should not be used as the sole basis for treatment or other  patient management decisions.  A negative result may occur with  improper  specimen collection / handling, submission of specimen other  than nasopharyngeal swab, presence of viral mutation(s) within the  areas targeted by this assay, and inadequate number of viral copies  (<250 copies / mL). A negative result must be combined with clinical  observations, patient history, and epidemiological information. If result is POSITIVE SARS-CoV-2 target nucleic acids are DETECTED. The SARS-CoV-2 RNA is generally detectable in upper and lower  respiratory specimens dur ing the acute phase of infection.  Positive  results are indicative of active infection with SARS-CoV-2.  Clinical  correlation with patient history and other diagnostic information is  necessary to determine patient infection status.  Positive results do  not rule out bacterial infection or co-infection with other viruses. If result is PRESUMPTIVE POSTIVE SARS-CoV-2 nucleic acids MAY BE PRESENT.   A presumptive positive result was obtained on the submitted specimen  and confirmed on repeat testing.  While 2019 novel coronavirus  (SARS-CoV-2) nucleic acids may be present in the submitted sample  additional confirmatory testing may be necessary for epidemiological  and / or clinical management purposes  to differentiate between  SARS-CoV-2 and other Sarbecovirus currently known to infect humans.  If clinically indicated additional testing with an alternate test  methodology [phone]) is advised. The SARS-CoV-2 RNA is generally  detectable in upper and lower respiratory sp ecimens during the acute  phase of infection. The expected result is Negative. Fact Sheet for Patients:  StrictlyIdeas.no Fact Sheet for Healthcare Providers: BankingDealers.co.za This test is not yet approved or cleared by the [place] FDA and has been authorized for detection and/or diagnosis of SARS-CoV-2 by FDA under an Emergency Use Authorization (EUA).  This EUA will remain in  effect (meaning this test can be used) for the duration of the COVID-19 declaration under Section 564(b)(1) of the Act, 21 U.S.C. section 360bbb-3(b)(1), unless the authorization is terminated or revoked sooner. Performed at [facility], [place] [address], [place], [place] [address]      Studies: No results found.  Scheduled Meds: . apixaban  5 mg Oral BID  . cycloSPORINE  1 drop Both Eyes BID  . diltiazem  120 mg Oral QHS  . famotidine  20 mg Oral Daily  . flecainide  100 mg Oral BID  . fluticasone  2 spray Each Nare QHS  . furosemide  40 mg Intravenous BID  . guaiFENesin  600 mg Oral BID  . ipratropium-albuterol  3 mL Nebulization Q6H  . loratadine  10 mg Oral Daily  . methylPREDNISolone (SOLU-MEDROL) injection  60 mg Intravenous Q6H  . metoprolol tartrate  25 mg Oral BID  . venlafaxine XR  187.5 mg Oral Q breakfast   Continuous Infusions:  Active Problems:   Morbid obesity (HCC)   HYPERTENSION, BENIGN SYSTEMIC   Atrial flutter (HCC)   Hematoma   Chronic anticoagulation   History of hypertension   Hypokalemia   Chronic respiratory failure with hypoxia (HCC)   COPD with acute exacerbation (HCC)   Heart failure with preserved ejection fraction (HCC)   Obesity hypoventilation syndrome ([place])   History of pulmonary embolus (PE)   Acute on chronic respiratory failure with hypoxemia (HCC)   Acute on chronic heart failure with preserved ejection fraction ([place])      Desiree Hane  [facility]

## 2019-03-17 ENCOUNTER — Inpatient Hospital Stay (HOSPITAL_COMMUNITY): Payer: Medicare HMO

## 2019-03-17 DIAGNOSIS — R062 Wheezing: Secondary | ICD-10-CM

## 2019-03-17 DIAGNOSIS — R0600 Dyspnea, unspecified: Secondary | ICD-10-CM

## 2019-03-17 DIAGNOSIS — R0689 Other abnormalities of breathing: Secondary | ICD-10-CM

## 2019-03-17 LAB — BASIC METABOLIC PANEL
Anion gap: 9 (ref 5–15)
BUN: 36 mg/dL — ABNORMAL HIGH (ref 8–23)
CO2: 30 mmol/L (ref 22–32)
Calcium: 8.2 mg/dL — ABNORMAL LOW (ref 8.9–10.3)
Chloride: 101 mmol/L (ref 98–111)
Creatinine, Ser: 0.69 mg/dL (ref 0.44–1.00)
GFR calc Af Amer: >60 mL/min (ref >60)
GFR calc non Af Amer: >60 mL/min (ref >60)
Glucose, Bld: 241 mg/dL — ABNORMAL HIGH (ref 70–99)
Potassium: 3.8 mmol/L (ref 3.5–5.1)
Sodium: 140 mmol/L (ref 135–145)

## 2019-03-17 LAB — CBC
HCT: 33.5 % — ABNORMAL LOW (ref 36.0–46.0)
Hemoglobin: 9.8 g/dL — ABNORMAL LOW (ref 12.0–15.0)
MCH: 27 pg (ref 26.0–34.0)
MCHC: 29.3 g/dL — ABNORMAL LOW (ref 30.0–36.0)
MCV: 92.3 fL (ref 80.0–100.0)
Platelets: 598 10*3/uL — ABNORMAL HIGH (ref 150–400)
RBC: 3.63 MIL/uL — ABNORMAL LOW (ref 3.87–5.11)
RDW: 20.2 % — ABNORMAL HIGH (ref 11.5–15.5)
WBC: 12.8 10*3/uL — ABNORMAL HIGH (ref 4.0–10.5)
nRBC: 0.2 % (ref 0.0–0.2)

## 2019-03-17 MED ORDER — SALINE SPRAY 0.65 % NA SOLN
1.0000 | NASAL | Status: DC | PRN
  Filled 2019-03-17: qty 44

## 2019-03-17 NOTE — Progress Notes (Signed)
  OT Cancellation Note  Patient Details Name: [name] White MRN: [id number] DOB: [birth year]   Cancelled Treatment:    Reason Eval/Treat Not Completed: Other (comment). Pt resting in bed. Reports she did not sleep well last night and requesting OT to come back later. Of note, pt also mentioned feeling dizzy, nursing notified. Plan to reattempt.  Tyrone Schimke, OT Acute Rehabilitation Services Pager: [phone] Office: [phone]  03/17/2019, 10:25 AM

## 2019-03-17 NOTE — Plan of Care (Signed)
  Problem: Education: Goal: Knowledge of General Education information will improve Description: Including pain rating scale, medication(s)/side effects and non-pharmacologic comfort measures Outcome: Progressing   Problem: Education: Goal: Ability to demonstrate management of disease process will improve Outcome: Progressing Goal: Ability to verbalize understanding of medication therapies will improve Outcome: Progressing Goal: Individualized Educational Video(s) Outcome: Progressing   Problem: Activity: Goal: Capacity to carry out activities will improve Outcome: Progressing   Problem: Cardiac: Goal: Ability to achieve and maintain adequate cardiopulmonary perfusion will improve Outcome: Progressing   Problem: Education: Goal: Knowledge of disease or condition will improve Outcome: Progressing Goal: Knowledge of the prescribed therapeutic regimen will improve Outcome: Progressing Goal: Individualized Educational Video(s) Outcome: Progressing   Problem: Activity: Goal: Ability to tolerate increased activity will improve Outcome: Progressing Goal: Will verbalize the importance of balancing activity with adequate rest periods Outcome: Progressing   Problem: Respiratory: Goal: Ability to maintain a clear airway will improve Outcome: Progressing Goal: Levels of oxygenation will improve Outcome: Progressing Goal: Ability to maintain adequate ventilation will improve Outcome: Progressing

## 2019-03-17 NOTE — Evaluation (Signed)
Physical Therapy Evaluation Patient Details Name: [name] White MRN: [id number] DOB: 1955-10-27 Today's Date: 03/17/2019   History of Present Illness  63 y.o. female with medical hitory significant for paroxysmal A. fib on eliquis, history of PE (2015), COPD, chronic respiratory failure on 2 L O2 at home, asthma, MDD, anxiety and reported OSA (n formal sleep study)who presents on 03/14/2019 with progressive dyspnea, nonproductive cough over the past 1 to 2 days.  Patient was recently discharged (admission 9/8-9/10) for abdminal wall hematoma sustained after a fall while on anticoagulation for PE/AF.   Clinical Impression  Pt admitted with above diagnosis.  Pt with multiple recent admissions and recent fall at home, will benefit from SNF post acute to maximize independence and safety; will follow in acute setting Pt currently with functional limitations due to the deficits listed below (see PT Problem List). Pt will benefit from skilled PT to increase their independence and safety with mobility to allow discharge to the venue listed below.   ]  Follow Up Recommendations SNF    Equipment Recommendations  None recommended by PT    Recommendations for Other Services       Precautions / Restrictions Precautions Precautions: Fall Precaution Comments: O2 dependent Restrictions Weight Bearing Restrictions: No      Mobility  Bed Mobility Overal bed mobility: Needs Assistance Bed Mobility: Supine to Sit     Supine to sit: Min guard     General bed mobility comments: in recliner on arrival  Transfers Overall transfer level: Needs assistance Equipment used: Rolling walker (2 wheeled) Transfers: Sit to/from Stand Sit to Stand: Min guard         General transfer comment: min guard for safety, transition to stand  Ambulation/Gait Ambulation/Gait assistance: Min guard Gait Distance (Feet): 40 Feet Assistive device: Rolling walker (2 wheeled) Gait Pattern/deviations:  Step-through pattern;Wide base of support;Trunk flexed     General Gait Details: cues for trunk extension and breathing  Stairs            Wheelchair Mobility    Modified Rankin (Stroke Patients Only)       Balance Overall balance assessment: Needs assistance Sitting-balance support: Feet supported Sitting balance-Leahy Scale: Good     Standing balance support: Bilateral upper extremity supported;During functional activity Standing balance-Leahy Scale: Fair Standing balance comment: able to stand statically without UE support briefly, fatigues qucikly, reliant on UEs for dynamic tasks                             Pertinent Vitals/Pain Pain Assessment: Faces Faces Pain Scale: Hurts a little bit Pain Location: back, abdomen RLE Pain Descriptors / Indicators: Aching;Sore Pain Intervention(s): Limited activity within patient's tolerance;Monitored during session    Home Living Family/patient expects to be discharged to:: Skilled nursing facility(pt requesting) Living Arrangements: Alone Available Help at Discharge: Friend(s);Available PRN/intermittently Type of Home: Other(Comment)(motel ) Home Access: Level entry     Home Layout: One level Home Equipment: Cane - single point;Walker - 2 wheels;Shower seat - built Investment banker, operational      Prior Function Level of Independence: Independent with assistive device(s)         Comments: independent ADLs (used AE for LB self care), mobility with cane--states space is too limited to amb with RW at home; limited IADls (reports having to wear home O2 for iADLs)     Hand Dominance        Extremity/Trunk Assessment   Upper Extremity  Assessment Upper Extremity Assessment: Overall WFL for tasks assessed;Defer to OT evaluation    Lower Extremity Assessment Lower Extremity Assessment: Generalized weakness       Communication   Communication: No difficulties  Cognition Arousal/Alertness:  Awake/alert Behavior During Therapy: WFL for tasks assessed/performed Overall Cognitive Status: Within Functional Limits for tasks assessed                                        General Comments General comments (skin integrity, edema, etc.): on 3L O2 throughout session. Pt with audible SOB after walking in the room. Sats 99 at end of sesseion    Exercises     Assessment/Plan    PT Assessment Patient needs continued PT services  PT Problem List Decreased strength;Decreased balance;Decreased activity tolerance;Decreased mobility;Cardiopulmonary status limiting activity;Decreased knowledge of use of DME       PT Treatment Interventions DME instruction;Gait training;Functional mobility training;Therapeutic activities;Therapeutic exercise;Balance training;Patient/family education    PT Goals (Current goals can be found in the Care Plan section)  Acute Rehab PT Goals PT Goal Formulation: With patient Time For Goal Achievement: 03/31/19 Potential to Achieve Goals: Good    Frequency Min 2X/week   Barriers to discharge Decreased caregiver support      Co-evaluation               AM-PAC PT "6 Clicks" Mobility  Outcome Measure Help needed turning from your back to your side while in a flat bed without using bedrails?: A Little Help needed moving from lying on your back to sitting on the side of a flat bed without using bedrails?: A Little Help needed moving to and from a bed to a chair (including a wheelchair)?: A Little Help needed standing up from a chair using your arms (e.g., wheelchair or bedside chair)?: A Little Help needed to walk in hospital room?: A Little Help needed climbing 3-5 steps with a railing? : A Lot 6 Click Score: 17    End of Session Equipment Utilized During Treatment: Oxygen Activity Tolerance: Patient tolerated treatment well;Patient limited by fatigue Patient left: in chair;with call bell/phone within reach;with chair alarm  set Nurse Communication: Mobility status PT Visit Diagnosis: Muscle weakness (generalized) (M62.81);Difficulty in walking, not elsewhere classified (R26.2);History of falling (Z91.81)    Time: 1350-1405 PT Time Calculation (min) (ACUTE ONLY): 15 min   Charges:   PT Evaluation $PT Eval Low Complexity: 1 Low          Kenyon Ana, PT  Pager: [phone] Acute Rehab Dept [facility]): [phone]   03/17/2019   [facility] 03/17/2019, 2:52 PM

## 2019-03-17 NOTE — Progress Notes (Signed)
[facility]  PROGRESS NOTE  [name] White [id number] DOB: Dec 30, 1955 DOA: 03/14/2019 PCP: [name], MD  Brief History   [name] White is a 63 y.o. female with medical history significant for paroxysmal A. fib on eliquis, history of PE (2015),COPD, chronic respiratory failure on 2 L O2 at home, asthma, MDD, anxiety and reported OSA (n formal sleep study)who presents on 03/14/2019 with progressive dyspnea, nonproductive cough over the past 1 to 2 days.  She states her shortness of breath progressively worsened throughout the day and shortness with the most while walking back and forth in her home.  It did not improve with her home inhalers.  She also noticed some associated chest tightness.  She measured her oxygen at home and found it to be in the low 80s.  Denied any fevers, no nausea, no vomiting, no sick contacts.  Denies any new lower leg swelling.  Does not weigh herself daily.  Does not take any Lasix for heart failure.  Patient was recently discharged (admission 9/8-9/10) for abdminal wall hematoma sustained after a fall while on anticoagulation for PE/AF.  During that admission, she reported BRBPR, and epiode of melena but no bleeding durng the admission ( did have positive FOBT) patient was switched from Xarelto to eliquis on discharge with outpatient colonoscopy arranged given stable hemoglobin of 10.2.   She was recently seen in the ED on 9/15 for concerns for bright red blood per rectum related to her recently diagnosed abdominal wall hematoma in the setting of anticoagulation therapy.  She instructed to hold off on further use of her Eliquis and resume in 2 days.  Patient has not taken Eliquis since the evening of 9/16    ED Course: Afebrile, respiratory rate 22, SPO2 67% on 2 L increased to 3 L with normal oxygen saturation.  Lab work notable for potassium 3.2, WBC 11.2, hemoglobin 10.5, BNP 68, COVID test negative, high-sensitivity from 8, chest x-ray showed  cardiomegaly with probable central pulmonary vascular congestion with atelectasis.    A & P     Acute on chronic hypoxic respiratory failure, suspect multifactorial etiology.    On admission SPO2 67% on 2 L (home regimen) required increased to 3 L likely combination of CHF flare, COPD exacerbation.  PE ruled out with CTA imaging though cannot definitively rule out small blood clots based off respiratory abnormalities.  Still requiring 3 L O2 with increase shortness of breath with minimal exertion, repeat chest x-ray shows no new abnormalities, we will continue IV diuresis for CHF and scheduled inhalers for COPD, close monitoring respiratory status wean O2 as able.   Chest tightness.  Likely due to above respiratory issues above.  Doubt any cardiac involvement given EKG with no acute ischemic etiologies has sensitive troponin trend unremarkable    Acute COPD exacerbation.  Worsening dyspnea, nonproductive cough, increased oxygen requirements, diffuse wheezing-which is improved from admission but still notably short of breath with minimal exertion, seems to be having a more productive cough, chest x-ray shows no acute abnormalities.    Continue scheduled duo nebs, IV Solu-Medrol, flutter valve, incentive spirometer, scheduled Mucinex, encourage out of bed to chair close monitoring respiratory status.   Acute on chronic CHF with preserved EF.  BNP within normal limits but unreliable in the setting of obesity, DOE, pulmonary vascular congestion on chest x-ray concerning for flare.  TTE (3/20) EF 55 to 60%.    Net -2 L in last 24 hours, weight down but still very dyspneic on  exam, continue IV Lasix 40 mg twice daily, monitor output, daily weights, fluid restrict, low-sodium diet.   Atrial fibrillation.  Currently normal sinus rhythm, rate controlled.  Continue home diltiazem, flecainide, Lopressor.  Monitor on telemetry, resume Eliquis   History of PE (2015).  Other potential etiologies of  shortness of breath seen more likely CTA chest negative for overt clot disease, unable to rule out smaller vessels given respiratory artifact, closely monitor. patient tolerating her home Eliquis   Hypokalemia, resolved.  Likely in the setting of IV diuresis, closely monitor potassium and replete as necessary.   HTN, at goal.   home Lopressor, diltiazem.  Closely monitor   Obesity hypoventilation syndrome/OSA.  No formal diagnosis of OSA.  Needs outpatient sleep study.  Continue supplemental oxygen, monitor respiratory status.   Abdominal wall hematoma, stable.  Some slight pain with palpation.  Denies any recurrent bright red blood per rectum since recent ED visit.  Hemoglobin stable, monitor CBC.     DVT prophylaxis: Home Eliquis Code Status: DNR discussed on day of admission Family Communication: No family at bedside Disposition Plan: Needs continued inpatient stay for continued IV diuresis and scheduled inhalers/steroids for greater improvement in respiratory status given persistence of symptoms with minimal exertion, closely monitor status    [facility] Direct contact: see [email or web address] (further directions at bottom of note if needed) 7PM-7AM contact night coverage as at bottom of note 03/17/2019, 1:50 PM  LOS: 3 days   Consultants  . None  Procedures  . None  Antibiotics  . None  Interval History/Subjective  Voice still hoarse Wants to try to get out of bed to chair Still feels short of breath when sitting up or moving  Objective   Vitals:  Vitals:   03/17/19 0840 03/17/19 1205  BP:  138/87  Pulse:  67  Resp:  19  Temp:  98.4 F (36.9 C)  SpO2: 95% 98%    Exam:  Awake Alert, Oriented X 3, No new F.N deficits, Normal affect [place].AT Unable to assess JVD given body habitus, no peripheral edema Symmetrical Chest wall movement,, no overt accessory muscle usage, scant wheezing worsens while sitting up during exam, on 3 L nasal cannula O2 RRR,No  Gallops,Rubs or new Murmurs,  +ve B.Sounds, Abd Soft, No tenderness,  No rebound - guarding or rigidity. Stable bruising in upper abdomen    I have personally reviewed the following:   Data Reviewed: Basic Metabolic Panel: Recent Labs  Lab 03/13/19 0150 03/14/19 0750 03/15/19 0400 03/16/19 0339 03/17/19 0423  NA 142 140 141 140 140  K 3.4* 3.2* 3.3* 4.0 3.8  CL 105 103 103 103 101  CO2 27 26 26 28 30   GLUCOSE 108* 142* 202* 171* 241*  BUN 12 16 20  36* 36*  CREATININE 0.58 0.57 0.78 0.82 0.69  CALCIUM 9.1 8.6* 8.4* 8.3* 8.2*   Liver Function Tests: Recent Labs  Lab 03/13/19 0150 03/14/19 0750  AST 30 19  ALT 26 23  ALKPHOS 86 87  BILITOT 0.6 0.8  PROT 8.0 7.9  ALBUMIN 3.6 3.6   No results for input(s): LIPASE, AMYLASE in the last 168 hours. No results for input(s): AMMONIA in the last 168 hours. CBC: Recent Labs  Lab 03/13/19 0150 03/14/19 0750 03/15/19 0400 03/16/19 0339 03/17/19 0423  WBC 11.9* 11.2* 10.2 15.1* 12.8*  NEUTROABS 9.2* 9.8*  --   --   --   HGB 10.6* 10.5* 9.8* 9.8* 9.8*  HCT 36.0 36.4 33.6* 34.0* 33.5*  MCV  92.1 94.8 92.8 94.2 92.3  PLT 571* 524* 553* 571* 598*   Cardiac Enzymes: No results for input(s): CKTOTAL, CKMB, CKMBINDEX, TROPONINI in the last 168 hours. BNP (last 3 results) Recent Labs    12/20/18 1340 02/26/19 0856 03/14/19 0750  BNP 27.8 34.5 68.6    ProBNP (last 3 results) No results for input(s): PROBNP in the last 8760 hours.  CBG: No results for input(s): GLUCAP in the last 168 hours.  Recent Results (from the past 240 hour(s))  SARS Coronavirus 2 [facility] order, Performed in [facility] hospital lab) Nasopharyngeal Nasopharyngeal Swab     Status: None   Collection Time: 03/14/19  7:35 AM   Specimen: Nasopharyngeal Swab  Result Value Ref Range Status   SARS Coronavirus 2 NEGATIVE NEGATIVE Final    Comment: (NOTE) If result is NEGATIVE SARS-CoV-2 target nucleic acids are NOT DETECTED. The SARS-CoV-2 RNA is  generally detectable in upper and lower  respiratory specimens during the acute phase of infection. The lowest  concentration of SARS-CoV-2 viral copies this assay can detect is 250  copies / mL. A negative result does not preclude SARS-CoV-2 infection  and should not be used as the sole basis for treatment or other  patient management decisions.  A negative result may occur with  improper specimen collection / handling, submission of specimen other  than nasopharyngeal swab, presence of viral mutation(s) within the  areas targeted by this assay, and inadequate number of viral copies  (<250 copies / mL). A negative result must be combined with clinical  observations, patient history, and epidemiological information. If result is POSITIVE SARS-CoV-2 target nucleic acids are DETECTED. The SARS-CoV-2 RNA is generally detectable in upper and lower  respiratory specimens dur ing the acute phase of infection.  Positive  results are indicative of active infection with SARS-CoV-2.  Clinical  correlation with patient history and other diagnostic information is  necessary to determine patient infection status.  Positive results do  not rule out bacterial infection or co-infection with other viruses. If result is PRESUMPTIVE POSTIVE SARS-CoV-2 nucleic acids MAY BE PRESENT.   A presumptive positive result was obtained on the submitted specimen  and confirmed on repeat testing.  While 2019 novel coronavirus  (SARS-CoV-2) nucleic acids may be present in the submitted sample  additional confirmatory testing may be necessary for epidemiological  and / or clinical management purposes  to differentiate between  SARS-CoV-2 and other Sarbecovirus currently known to infect humans.  If clinically indicated additional testing with an alternate test  methodology [phone]) is advised. The SARS-CoV-2 RNA is generally  detectable in upper and lower respiratory sp ecimens during the acute  phase of infection.  The expected result is Negative. Fact Sheet for Patients:  StrictlyIdeas.no Fact Sheet for Healthcare Providers: BankingDealers.co.za This test is not yet approved or cleared by the [place] FDA and has been authorized for detection and/or diagnosis of SARS-CoV-2 by FDA under an Emergency Use Authorization (EUA).  This EUA will remain in effect (meaning this test can be used) for the duration of the COVID-19 declaration under Section 564(b)(1) of the Act, 21 U.S.C. section 360bbb-3(b)(1), unless the authorization is terminated or revoked sooner. Performed at [facility], [place] [address] [place], [place] [address]      Studies: Dg Chest Port 1 View  Result Date: 03/17/2019 CLINICAL DATA:  Re-evaluation for pneumonia EXAM: PORTABLE CHEST 1 VIEW COMPARISON:  03/14/2019 FINDINGS: Mild bilateral interstitial prominence. No pleural effusion or pneumothorax. No focal  consolidation. Right perihilar atelectasis. Stable cardiomegaly. No acute osseous abnormality. IMPRESSION: No acute cardiopulmonary disease. Stable cardiomegaly. Electronically Signed   By: Kathreen Devoid   On: 03/17/2019 12:11    Scheduled Meds: . apixaban  5 mg Oral BID  . cycloSPORINE  1 drop Both Eyes BID  . diltiazem  120 mg Oral QHS  . famotidine  20 mg Oral Daily  . flecainide  100 mg Oral BID  . fluticasone  2 spray Each Nare QHS  . furosemide  40 mg Intravenous BID  . guaiFENesin  600 mg Oral BID  . ipratropium-albuterol  3 mL Nebulization Q6H  . loratadine  10 mg Oral Daily  . methylPREDNISolone (SOLU-MEDROL) injection  60 mg Intravenous Q6H  . metoprolol tartrate  25 mg Oral BID  . venlafaxine XR  187.5 mg Oral Q breakfast   Continuous Infusions:  Active Problems:   Morbid obesity (HCC)   HYPERTENSION, BENIGN SYSTEMIC   Atrial flutter (HCC)   Hematoma   Chronic anticoagulation   History of hypertension   Hypokalemia   Chronic  respiratory failure with hypoxia (HCC)   COPD with acute exacerbation (HCC)   Heart failure with preserved ejection fraction (HCC)   Obesity hypoventilation syndrome ([place])   History of pulmonary embolus (PE)   Acute on chronic respiratory failure with hypoxemia (HCC)   Acute on chronic heart failure with preserved ejection fraction ([place])      [name] White  [facility]

## 2019-03-17 NOTE — Evaluation (Signed)
Occupational Therapy Evaluation Patient Details Name: [name] White MRN: [id number] DOB: 1956-04-11 Today's Date: 03/17/2019    History of Present Illness 63 y.o. female with medical hitory significant for paroxysmal A. fib on eliquis, history of PE (2015), COPD, chronic respiratory failure on 2 L O2 at home, asthma, MDD, anxiety and reported OSA (n formal sleep study)who presents on 03/14/2019 with progressive dyspnea, nonproductive cough over the past 1 to 2 days.  Patient was recently discharged (admission 9/8-9/10) for abdminal wall hematoma sustained after a fall while on anticoagulation for PE/AF.    Clinical Impression   Pt admitted with the above diagnoses and presents with below problem list. Pt will benefit from continued acute OT to address the below listed deficits and maximize independence with basic ADLs prior to d/c to venue below. At baseline, pt is mod I with ADLs. Pt is currently setup to min A with ADLs, min guard A to walk in the room though noted to be very SOB after minimal walking, likely would benefit from +2 for chair follow for progressing ambulation. Pt is from home, alone. Recommend ST SNF for rehab prior to d/c home.      Follow Up Recommendations  SNF;Supervision/Assistance - 24 hour    Equipment Recommendations  None recommended by OT    Recommendations for Other Services       Precautions / Restrictions Precautions Precautions: Fall Precaution Comments: O2 dependent, 2L at baseline Restrictions Weight Bearing Restrictions: No      Mobility Bed Mobility Overal bed mobility: Needs Assistance Bed Mobility: Supine to Sit     Supine to sit: Min guard     General bed mobility comments: in recliner on arrival  Transfers Overall transfer level: Needs assistance Equipment used: Rolling walker (2 wheeled) Transfers: Sit to/from Stand Sit to Stand: Min guard         General transfer comment: min guard    Balance Overall balance assessment:  Needs assistance Sitting-balance support: Feet supported Sitting balance-Leahy Scale: Good     Standing balance support: Bilateral upper extremity supported;During functional activity Standing balance-Leahy Scale: Fair Standing balance comment: able to stand statically without UE support briefly, fatigues qucikly, reliant on UEs for dynamic tasks                           ADL either performed or assessed with clinical judgement   ADL Overall ADL's : Needs assistance/impaired Eating/Feeding: Set up;Sitting   Grooming: Set up;Sitting   Upper Body Bathing: Set up;Sitting   Lower Body Bathing: Minimal assistance;Sit to/from stand   Upper Body Dressing : Set up;Sitting   Lower Body Dressing: Minimal assistance;Sit to/from stand   Toilet Transfer: Min guard;RW;Ambulation;Comfort height toilet;Grab bars   Toileting- Water quality scientist and Hygiene: Minimal assistance;Sit to/from stand   Tub/ Banker: Min guard;Minimal assistance   Functional mobility during ADLs: Min guard;Rolling walker General ADL Comments: pt limited by decreased act tolerance     Vision         Perception     Praxis      Pertinent Vitals/Pain Pain Assessment: Faces Faces Pain Scale: Hurts a little bit Pain Location: back, abdomen RLE Pain Descriptors / Indicators: Aching;Sore Pain Intervention(s): Limited activity within patient's tolerance;Monitored during session     Hand Dominance     Extremity/Trunk Assessment Upper Extremity Assessment Upper Extremity Assessment: Generalized weakness   Lower Extremity Assessment Lower Extremity Assessment: Defer to PT evaluation  Communication Communication Communication: No difficulties   Cognition Arousal/Alertness: Awake/alert Behavior During Therapy: WFL for tasks assessed/performed Overall Cognitive Status: Within Functional Limits for tasks assessed                                     General  Comments  on 3L O2 throughout session. Pt with audible SOB after walking in the room. Sats 99 at end of sesseion    Exercises     Shoulder Instructions      Home Living Family/patient expects to be discharged to:: Other (Comment) Living Arrangements: Alone Available Help at Discharge: Friend(s);Available PRN/intermittently Type of Home: Other(Comment)(motel ) Home Access: Level entry     Home Layout: One level     Bathroom Shower/Tub: Occupational psychologist: Standard     Home Equipment: Cane - single point;Walker - 2 wheels;Shower seat - built Hotel manager: Reacher;Sock aid;Long-handled shoe horn;Long-handled sponge        Prior Functioning/Environment Level of Independence: Independent with assistive device(s)        Comments: independent ADLs (used AE for LB self care), mobility with cane; limited IADls (reports having to wear home O2 for iADLs)        OT Problem List: Decreased activity tolerance;Impaired balance (sitting and/or standing);Decreased safety awareness;Decreased knowledge of use of DME or AE;Decreased knowledge of precautions;Cardiopulmonary status limiting activity;Obesity;Pain      OT Treatment/Interventions: Self-care/ADL training;DME and/or AE instruction;Energy conservation;Therapeutic activities;Balance training;Patient/family education;Therapeutic exercise    OT Goals(Current goals can be found in the care plan section) Acute Rehab OT Goals Patient Stated Goal: rehab then home OT Goal Formulation: With patient Time For Goal Achievement: 03/31/19 Potential to Achieve Goals: Good ADL Goals Pt Will Perform Grooming: with supervision;standing;with set-up;sitting Pt Will Perform Upper Body Bathing: with set-up;sitting Pt Will Perform Lower Body Bathing: with modified independence;sit to/from stand Pt Will Perform Upper Body Dressing: with set-up;sitting Pt Will Perform Lower Body Dressing: with modified  independence;sit to/from stand Pt Will Transfer to Toilet: with modified independence;ambulating Pt Will Perform Toileting - Clothing Manipulation and hygiene: with modified independence;sit to/from stand Pt Will Perform Tub/Shower Transfer: with supervision;ambulating;3 in 1;rolling walker  OT Frequency: Min 2X/week   Barriers to D/C:            Co-evaluation              AM-PAC OT "6 Clicks" Daily Activity     Outcome Measure Help from another person eating meals?: None Help from another person taking care of personal grooming?: A Little Help from another person toileting, which includes using toliet, bedpan, or urinal?: A Little Help from another person bathing (including washing, rinsing, drying)?: A Little Help from another person to put on and taking off regular upper body clothing?: A Little Help from another person to put on and taking off regular lower body clothing?: A Little 6 Click Score: 19   End of Session Equipment Utilized During Treatment: Rolling walker;Oxygen(3L)  Activity Tolerance: Patient limited by fatigue;Other (comment)(SOB and wheezing after walking to recliner) Patient left: in chair;with call bell/phone within reach;with chair alarm set  OT Visit Diagnosis: Unsteadiness on feet (R26.81);Muscle weakness (generalized) (M62.81);History of falling (Z91.81);Pain                Time: 1212-1222 OT Time Calculation (min): 10 min Charges:  OT General Charges $OT Visit: 1 Visit OT Evaluation $OT Eval  Low Complexity: Isabel, OT Acute Rehabilitation Services Pager: [phone] Office: [phone]   Hortencia Pilar 03/17/2019, 3:33 PM

## 2019-03-18 LAB — CBC
HCT: 34.6 % — ABNORMAL LOW (ref 36.0–46.0)
Hemoglobin: 10.1 g/dL — ABNORMAL LOW (ref 12.0–15.0)
MCH: 26.9 pg (ref 26.0–34.0)
MCHC: 29.2 g/dL — ABNORMAL LOW (ref 30.0–36.0)
MCV: 92 fL (ref 80.0–100.0)
Platelets: 655 10*3/uL — ABNORMAL HIGH (ref 150–400)
RBC: 3.76 MIL/uL — ABNORMAL LOW (ref 3.87–5.11)
RDW: 19.9 % — ABNORMAL HIGH (ref 11.5–15.5)
WBC: 13 10*3/uL — ABNORMAL HIGH (ref 4.0–10.5)
nRBC: 0.2 % (ref 0.0–0.2)

## 2019-03-18 MED ORDER — METHYLPREDNISOLONE SODIUM SUCC 125 MG IJ SOLR
60.0000 mg | Freq: Three times a day (TID) | INTRAMUSCULAR | Status: AC
  Administered 2019-03-19 (×3): 60 mg via INTRAVENOUS
  Filled 2019-03-18 (×3): qty 2

## 2019-03-18 MED ORDER — PREDNISONE 20 MG PO TABS: 40.0000 mg | ORAL_TABLET | Freq: Every day | ORAL | Status: DC

## 2019-03-18 MED ORDER — DOXYCYCLINE HYCLATE 100 MG PO TABS
100.0000 mg | ORAL_TABLET | Freq: Two times a day (BID) | ORAL | Status: DC
  Administered 2019-03-18 – 2019-03-20 (×5): 100 mg via ORAL
  Filled 2019-03-18 (×5): qty 1

## 2019-03-18 MED ORDER — METHYLPREDNISOLONE SODIUM SUCC 125 MG IJ SOLR
60.0000 mg | Freq: Two times a day (BID) | INTRAMUSCULAR | Status: DC
  Administered 2019-03-20: 60 mg via INTRAVENOUS
  Filled 2019-03-18: qty 2

## 2019-03-18 NOTE — NC FL2 (Addendum)
[place] MEDICAID FL2 LEVEL OF CARE SCREENING TOOL     IDENTIFICATION  Patient Name: [name] White Birthdate: [birth year] Sex: female Admission Date (Current Location): 03/14/2019  [facility] and [place] Number:  Herbalist and Address:  [facility],  [place], [place]      Provider Number: [id number]  Attending Physician Name and Address:  Desiree Hane, MD  Relative Name and Phone Number:  Hartford Poli [phone]    Current Level of Care: Hospital Recommended Level of Care: [place] Prior Approval Number:    Date Approved/Denied:   PASRR Number:    Discharge Plan: SNF    Current Diagnoses: Patient Active Problem List   Diagnosis Date Noted  . Acute on chronic respiratory failure with hypoxemia ([place]) 03/14/2019  . Acute on chronic heart failure with preserved ejection fraction ([place]) 03/14/2019  . Acquired thrombophilia ([place]) due to A-Fib 03/07/2019  . BRBPR (bright red blood per rectum) 03/06/2019  . Heart failure with preserved ejection fraction ([place]) 03/06/2019  . Active bleeding 03/06/2019  . Obesity hypoventilation syndrome ([place]) 03/06/2019  . Abdominal wall hematoma 03/06/2019  . Lower GI bleed   . High risk social situation   . Urge incontinence 01/27/2019  . Homelessness 12/30/2018  . Reactive airway disease 09/10/2018  . COPD with acute exacerbation ([place]) 08/07/2018  . Sleep apnea 07/05/2018  . Chronic respiratory failure with hypoxia ([place]) 07/05/2018  . Hematoma 11/27/2017  . Severe episode of recurrent major depressive disorder, without psychotic features ([place])   . MDD (major depressive disorder), recurrent episode, severe ([place]) 09/07/2015  . Atrial flutter ([place]) 07/29/2015  . Asthma 11/12/2014  . History of hypertension 11/05/2014  . Paroxysmal atrial fibrillation ([place]) 11/03/2014  . Chronic anticoagulation 11/03/2014  . Hypokalemia 11/03/2014  . Major depressive disorder 11/03/2014  . History  of pulmonary embolus (PE) 11/03/2014  . MDD (major depressive disorder), recurrent severe, without psychosis ([place]) 03/05/2014  . Vocal cord dysfunction 11/12/2013  . Mixed headache 01/06/2012  . Hot flashes 04/22/2011  . OVERACTIVE BLADDER 04/18/2008  . Osteoarthritis of both knees 04/18/2008  . Osteoarthritis involving multiple joints on both sides of body 09/14/2007  . Morbid obesity ([place]) 08/24/2006  . RESTLESS LEGS SYNDROME 08/24/2006  . HYPERTENSION, BENIGN SYSTEMIC 08/24/2006  . RHINITIS, ALLERGIC 08/24/2006  . GASTROESOPHAGEAL REFLUX, NO ESOPHAGITIS 08/24/2006    Orientation RESPIRATION BLADDER Height & Weight     Self, Time, Situation, Place  O2 External catheter Weight: (!) 161.8 kg Height:  5\' 5"  (165.1 cm)  BEHAVIORAL SYMPTOMS/MOOD NEUROLOGICAL BOWEL NUTRITION STATUS      Continent Diet(Regular Heart Healthy)  AMBULATORY STATUS COMMUNICATION OF NEEDS Skin   Extensive Assist Verbally Normal                       Personal Care Assistance Level of Assistance  Bathing, Feeding, Dressing Bathing Assistance: Limited assistance Feeding assistance: Independent Dressing Assistance: Limited assistance Total Care Assistance: Limited assistance   Functional Limitations Info  Sight, Hearing, Speech Sight Info: Adequate(Glasses) Hearing Info: Adequate Speech Info: Adequate    SPECIAL CARE FACTORS FREQUENCY  PT (By licensed PT), OT (By licensed OT)     PT Frequency: x5 OT Frequency: x5            Contractures Contractures Info: Not present    Additional Factors Info  Code Status, Allergies Code Status Info: DNR Allergies Info: Caffeine, Hydralazine Hcl, Hydrocodone, Ciprofloxacin, Erythromycin, Lisinopril, Oxycodone, Sulfamethoxazole-trimethoprim  Current Medications (03/18/2019):  This is the current hospital active medication list Current Facility-Administered Medications  Medication Dose Route Frequency Provider Last Rate Last Dose  .  acetaminophen (TYLENOL) tablet 650 mg  650 mg Oral Q6H PRN Oretha Milch D, MD   650 mg at 03/16/19 2211   Or  . acetaminophen (TYLENOL) suppository 650 mg  650 mg Rectal Q6H PRN Oretha Milch D, MD      . albuterol (PROVENTIL) (2.5 MG/3ML) 0.083% nebulizer solution 2.5 mg  2.5 mg Nebulization Q6H PRN Oretha Milch D, MD      . apixaban (ELIQUIS) tablet 5 mg  5 mg Oral BID Oretha Milch D, MD   5 mg at 03/18/19 1000  . cyclobenzaprine (FLEXERIL) tablet 10 mg  10 mg Oral TID PRN Oretha Milch D, MD      . cycloSPORINE (RESTASIS) 0.05 % ophthalmic emulsion 1 drop  1 drop Both Eyes BID Oretha Milch D, MD   1 drop at 03/18/19 1003  . diclofenac sodium (VOLTAREN) 1 % transdermal gel 2 g  2 g Topical QID PRN Oretha Milch D, MD   2 g at 03/16/19 0946  . diltiazem (CARDIZEM CD) 24 hr capsule 120 mg  120 mg Oral QHS Oretha Milch D, MD   120 mg at 03/17/19 2205  . doxycycline (VIBRA-TABS) tablet 100 mg  100 mg Oral Q12H Oretha Milch D, MD      . famotidine (PEPCID) tablet 20 mg  20 mg Oral Daily Oretha Milch D, MD   20 mg at 03/18/19 1029  . flecainide (TAMBOCOR) tablet 100 mg  100 mg Oral BID Oretha Milch D, MD   100 mg at 03/18/19 1000  . fluticasone (FLONASE) 50 MCG/ACT nasal spray 2 spray  2 spray Each Nare QHS Oretha Milch D, MD   2 spray at 03/17/19 2215  . furosemide (LASIX) injection 40 mg  40 mg Intravenous BID Oretha Milch D, MD   40 mg at 03/18/19 1023  . guaiFENesin (MUCINEX) 12 hr tablet 600 mg  600 mg Oral BID Oretha Milch D, MD   600 mg at 03/18/19 1028  . guaiFENesin-dextromethorphan (ROBITUSSIN DM) 100-10 MG/5ML syrup 5 mL  5 mL Oral Q4H PRN Oretha Milch D, MD   5 mL at 03/17/19 0918  . hydrOXYzine (ATARAX/VISTARIL) tablet 25 mg  25 mg Oral BID PRN Oretha Milch D, MD   25 mg at 03/18/19 1035  . ipratropium-albuterol (DUONEB) 0.5-2.5 (3) MG/3ML nebulizer solution 3 mL  3 mL Nebulization Q6H Oretha Milch D, MD   3 mL at 03/18/19 1406  . loratadine (CLARITIN) tablet  10 mg  10 mg Oral Daily Oretha Milch D, MD   10 mg at 03/18/19 1028  . [START ON 03/19/2019] methylPREDNISolone sodium succinate (SOLU-MEDROL) 125 mg/2 mL injection 60 mg  60 mg Intravenous Q8H Desiree Hane, MD       Followed by  . [START ON 03/20/2019] methylPREDNISolone sodium succinate (SOLU-MEDROL) 125 mg/2 mL injection 60 mg  60 mg Intravenous Q12H Desiree Hane, MD       Followed by  . [START ON 03/21/2019] predniSONE (DELTASONE) tablet 40 mg  40 mg Oral Q breakfast Oretha Milch D, MD      . metoprolol tartrate (LOPRESSOR) tablet 25 mg  25 mg Oral BID Oretha Milch D, MD   25 mg at 03/18/19 1029  . ondansetron (ZOFRAN) tablet 4 mg  4 mg Oral Q6H PRN Desiree Hane, MD  Or  . ondansetron (ZOFRAN) injection 4 mg  4 mg Intravenous Q6H PRN Oretha Milch D, MD      . polyethylene glycol (MIRALAX / GLYCOLAX) packet 17 g  17 g Oral Daily PRN Oretha Milch D, MD   17 g at 03/17/19 1840  . sodium chloride (OCEAN) 0.65 % nasal spray 1 spray  1 spray Each Nare PRN Oretha Milch D, MD      . venlafaxine XR (EFFEXOR-XR) 24 hr capsule 187.5 mg  187.5 mg Oral Q breakfast Oretha Milch D, MD   187.5 mg at 03/18/19 1029     Discharge Medications: Please see discharge summary for a list of discharge medications.  Relevant Imaging Results:  Relevant Lab Results:   Additional Information [id number]  Purcell Mouton, RN

## 2019-03-18 NOTE — Progress Notes (Addendum)
[facility]  PROGRESS NOTE  [name] White [id number] DOB: 1955-09-28 DOA: 03/14/2019 PCP: [name], MD  Brief History   [name] White is a 63 y.o. female with medical history significant for paroxysmal A. fib on eliquis, history of PE (2015),COPD, chronic respiratory failure on 2 L O2 at home, asthma, MDD, anxiety and reported OSA (n formal sleep study)who presents on 03/14/2019 with progressive dyspnea, nonproductive cough over the past 1 to 2 days.  She states her shortness of breath progressively worsened throughout the day and shortness with the most while walking back and forth in her home.  It did not improve with her home inhalers.  She also noticed some associated chest tightness.  She measured her oxygen at home and found it to be in the low 80s.  Denied any fevers, no nausea, no vomiting, no sick contacts.  Denies any new lower leg swelling.  Does not weigh herself daily.  Does not take any Lasix for heart failure.  Patient was recently discharged (admission 9/8-9/10) for abdminal wall hematoma sustained after a fall while on anticoagulation for PE/AF.  During that admission, she reported BRBPR, and epiode of melena but no bleeding durng the admission ( did have positive FOBT) patient was switched from Xarelto to eliquis on discharge with outpatient colonoscopy arranged given stable hemoglobin of 10.2.   She was recently seen in the ED on 9/15 for concerns for bright red blood per rectum related to her recently diagnosed abdominal wall hematoma in the setting of anticoagulation therapy.  She instructed to hold off on further use of her Eliquis and resume in 2 days.  Patient has not taken Eliquis since the evening of 9/16    ED Course: Afebrile, respiratory rate 22, SPO2 67% on 2 L increased to 3 L with normal oxygen saturation.  Lab work notable for potassium 3.2, WBC 11.2, hemoglobin 10.5, BNP 68, COVID test negative, high-sensitivity from 8, chest x-ray showed  cardiomegaly with probable central pulmonary vascular congestion with atelectasis.     A & P     Acute on chronic hypoxic respiratory failure, suspect multifactorial etiology.    On admission SPO2 67% on 2 L (home regimen) required increased to 3 L likely combination of CHF flare, COPD exacerbation.  PE ruled out with CTA imaging though cannot definitively rule out small blood clots based off respiratory abnormalities.  Still requiring 3 L O2 with increase shortness of breath with minimal exertion, repeat chest x-ray shows no new abnormalities, we will continue IV diuresis for CHF and scheduled inhalers for COPD, begin Solu-Medrol taper close monitoring respiratory status wean O2 as able.   Chest tightness.  Likely due to above respiratory issues above.  Doubt any cardiac involvement given EKG with no acute ischemic etiologies has sensitive troponin trend unremarkable    Acute COPD exacerbation.  Worsening dyspnea, nonproductive cough, increased oxygen requirements, diffuse wheezing-which is improved from admission but still notably short of breath with minimal exertion, seems to be having a more productive cough, chest x-ray shows no acute abnormalities.    Add doxycycline. Continue scheduled duo nebs, IV Solu-Medrol, flutter valve, incentive spirometer, scheduled Mucinex, encourage out of bed to chair close monitoring respiratory status.   Acute on chronic CHF with preserved EF.  BNP within normal limits but unreliable in the setting of obesity, DOE, pulmonary vascular congestion on chest x-ray concerning for flare.  TTE (3/20) EF 55 to 60%.    Net -2 L in last 24 hours, weight  down, slight improvement in dyspnea but still dyspneic with exertion on exam, continue IV Lasix 40 mg twice daily, monitor output, daily weights, fluid restrict, low-sodium diet.   Atrial fibrillation.  Currently normal sinus rhythm, rate controlled.  Continue home diltiazem, flecainide, Lopressor.  Monitor on  telemetry, resume Eliquis   History of PE (2015).  Other potential etiologies of shortness of breath seen more likely CTA chest negative for overt clot disease, unable to rule out smaller vessels given respiratory artifact, closely monitor. patient tolerating her home Eliquis   Hypokalemia, resolved.  Likely in the setting of IV diuresis, closely monitor potassium and replete as necessary.   HTN, at goal.   home Lopressor, diltiazem.  Closely monitor   Obesity hypoventilation syndrome/OSA.  No formal diagnosis of OSA.  Needs outpatient sleep study.  Continue supplemental oxygen, monitor respiratory status.   Abdominal wall hematoma, stable.  Some slight pain with palpation.  Denies any recurrent bright red blood per rectum since recent ED visit.  Hemoglobin stable, monitor CBC.     DVT prophylaxis: Home Eliquis Code Status: DNR discussed on day of admission Family Communication: No family at bedside Disposition Plan: Needs continued inpatient stay for continued IV diuresis and scheduled inhalers/steroids for greater improvement in respiratory status given persistence of symptoms with minimal exertion, closely monitor status    [facility] Direct contact: see [email or web address] (further directions at bottom of note if needed) 7PM-7AM contact night coverage as at bottom of note 03/18/2019, 1:56 PM  LOS: 4 days   Consultants  . None  Procedures  . None  Antibiotics  . None  Interval History/Subjective  Voice still hoarse Did okay getting out of bed to chair Refused PT/OT yesterday Still has significant shortness of breath with exertion  Objective   Vitals:  Vitals:   03/18/19 0835 03/18/19 1352  BP:  108/62  Pulse:  65  Resp:  16  Temp:  98.9 F (37.2 C)  SpO2: 90% 100%    Exam:  Awake Alert, Oriented X 3, No new F.N deficits, Normal affect [place].AT Unable to assess JVD given body habitus, no peripheral edema Symmetrical Chest wall movement,, no overt  accessory muscle usage, scant wheezing worsens while sitting up during exam, on 3 L nasal cannula O2 RRR,No Gallops,Rubs or new Murmurs,  +ve B.Sounds, Abd Soft, No tenderness,  No rebound - guarding or rigidity. Stable bruising in upper abdomen    I have personally reviewed the following:   Data Reviewed: Basic Metabolic Panel: Recent Labs  Lab 03/13/19 0150 03/14/19 0750 03/15/19 0400 03/16/19 0339 03/17/19 0423  NA 142 140 141 140 140  K 3.4* 3.2* 3.3* 4.0 3.8  CL 105 103 103 103 101  CO2 27 26 26 28 30   GLUCOSE 108* 142* 202* 171* 241*  BUN 12 16 20  36* 36*  CREATININE 0.58 0.57 0.78 0.82 0.69  CALCIUM 9.1 8.6* 8.4* 8.3* 8.2*   Liver Function Tests: Recent Labs  Lab 03/13/19 0150 03/14/19 0750  AST 30 19  ALT 26 23  ALKPHOS 86 87  BILITOT 0.6 0.8  PROT 8.0 7.9  ALBUMIN 3.6 3.6   No results for input(s): LIPASE, AMYLASE in the last 168 hours. No results for input(s): AMMONIA in the last 168 hours. CBC: Recent Labs  Lab 03/13/19 0150 03/14/19 0750 03/15/19 0400 03/16/19 0339 03/17/19 0423 03/18/19 0415  WBC 11.9* 11.2* 10.2 15.1* 12.8* 13.0*  NEUTROABS 9.2* 9.8*  --   --   --   --  HGB 10.6* 10.5* 9.8* 9.8* 9.8* 10.1*  HCT 36.0 36.4 33.6* 34.0* 33.5* 34.6*  MCV 92.1 94.8 92.8 94.2 92.3 92.0  PLT 571* 524* 553* 571* 598* 655*   Cardiac Enzymes: No results for input(s): CKTOTAL, CKMB, CKMBINDEX, TROPONINI in the last 168 hours. BNP (last 3 results) Recent Labs    12/20/18 1340 02/26/19 0856 03/14/19 0750  BNP 27.8 34.5 68.6    ProBNP (last 3 results) No results for input(s): PROBNP in the last 8760 hours.  CBG: No results for input(s): GLUCAP in the last 168 hours.  Recent Results (from the past 240 hour(s))  SARS Coronavirus 2 [facility] order, Performed in [facility] hospital lab) Nasopharyngeal Nasopharyngeal Swab     Status: None   Collection Time: 03/14/19  7:35 AM   Specimen: Nasopharyngeal Swab  Result Value Ref Range Status   SARS  Coronavirus 2 NEGATIVE NEGATIVE Final    Comment: (NOTE) If result is NEGATIVE SARS-CoV-2 target nucleic acids are NOT DETECTED. The SARS-CoV-2 RNA is generally detectable in upper and lower  respiratory specimens during the acute phase of infection. The lowest  concentration of SARS-CoV-2 viral copies this assay can detect is 250  copies / mL. A negative result does not preclude SARS-CoV-2 infection  and should not be used as the sole basis for treatment or other  patient management decisions.  A negative result may occur with  improper specimen collection / handling, submission of specimen other  than nasopharyngeal swab, presence of viral mutation(s) within the  areas targeted by this assay, and inadequate number of viral copies  (<250 copies / mL). A negative result must be combined with clinical  observations, patient history, and epidemiological information. If result is POSITIVE SARS-CoV-2 target nucleic acids are DETECTED. The SARS-CoV-2 RNA is generally detectable in upper and lower  respiratory specimens dur ing the acute phase of infection.  Positive  results are indicative of active infection with SARS-CoV-2.  Clinical  correlation with patient history and other diagnostic information is  necessary to determine patient infection status.  Positive results do  not rule out bacterial infection or co-infection with other viruses. If result is PRESUMPTIVE POSTIVE SARS-CoV-2 nucleic acids MAY BE PRESENT.   A presumptive positive result was obtained on the submitted specimen  and confirmed on repeat testing.  While 2019 novel coronavirus  (SARS-CoV-2) nucleic acids may be present in the submitted sample  additional confirmatory testing may be necessary for epidemiological  and / or clinical management purposes  to differentiate between  SARS-CoV-2 and other Sarbecovirus currently known to infect humans.  If clinically indicated additional testing with an alternate test   methodology [phone]) is advised. The SARS-CoV-2 RNA is generally  detectable in upper and lower respiratory sp ecimens during the acute  phase of infection. The expected result is Negative. Fact Sheet for Patients:  StrictlyIdeas.no Fact Sheet for Healthcare Providers: BankingDealers.co.za This test is not yet approved or cleared by the [place] FDA and has been authorized for detection and/or diagnosis of SARS-CoV-2 by FDA under an Emergency Use Authorization (EUA).  This EUA will remain in effect (meaning this test can be used) for the duration of the COVID-19 declaration under Section 564(b)(1) of the Act, 21 U.S.C. section 360bbb-3(b)(1), unless the authorization is terminated or revoked sooner. Performed at [facility], [place] [address] [place], [place] [address]      Studies: Dg Chest Port 1 View  Result Date: 03/17/2019 CLINICAL DATA:  Re-evaluation for pneumonia EXAM:  PORTABLE CHEST 1 VIEW COMPARISON:  03/14/2019 FINDINGS: Mild bilateral interstitial prominence. No pleural effusion or pneumothorax. No focal consolidation. Right perihilar atelectasis. Stable cardiomegaly. No acute osseous abnormality. IMPRESSION: No acute cardiopulmonary disease. Stable cardiomegaly. Electronically Signed   By: Kathreen Devoid   On: 03/17/2019 12:11    Scheduled Meds: . apixaban  5 mg Oral BID  . cycloSPORINE  1 drop Both Eyes BID  . diltiazem  120 mg Oral QHS  . famotidine  20 mg Oral Daily  . flecainide  100 mg Oral BID  . fluticasone  2 spray Each Nare QHS  . furosemide  40 mg Intravenous BID  . guaiFENesin  600 mg Oral BID  . ipratropium-albuterol  3 mL Nebulization Q6H  . loratadine  10 mg Oral Daily  . [START ON 03/19/2019] methylPREDNISolone (SOLU-MEDROL) injection  60 mg Intravenous Q8H   Followed by  . [START ON 03/20/2019] methylPREDNISolone (SOLU-MEDROL) injection  60 mg Intravenous Q12H   Followed by  .  [START ON 03/21/2019] predniSONE  40 mg Oral Q breakfast  . metoprolol tartrate  25 mg Oral BID  . venlafaxine XR  187.5 mg Oral Q breakfast   Continuous Infusions:  Active Problems:   Morbid obesity (HCC)   HYPERTENSION, BENIGN SYSTEMIC   Atrial flutter (HCC)   Hematoma   Chronic anticoagulation   History of hypertension   Hypokalemia   Chronic respiratory failure with hypoxia (HCC)   COPD with acute exacerbation (HCC)   Heart failure with preserved ejection fraction (HCC)   Obesity hypoventilation syndrome ([place])   History of pulmonary embolus (PE)   Acute on chronic respiratory failure with hypoxemia (HCC)   Acute on chronic heart failure with preserved ejection fraction ([place])      Desiree Hane  [facility]

## 2019-03-18 NOTE — Progress Notes (Signed)
Physical Therapy Treatment Patient Details Name: [name] White MRN: [id number] DOB: [birth year] Today's Date: 03/18/2019    History of Present Illness 63 y.o. female with medical hitory significant for paroxysmal A. fib on eliquis, history of PE (2015), COPD, chronic respiratory failure on 2 L O2 at home, asthma, MDD, anxiety and reported OSA (n formal sleep study)who presents on 03/14/2019 with progressive dyspnea, nonproductive cough over the past 1 to 2 days and admitted for Acute on chronic hypoxic respiratory failure, suspect multifactorial etiology.  Patient was recently discharged (admission 9/8-9/10) for abdominal wall hematoma sustained after a fall while on anticoagulation for PE/AF.    PT Comments    Pt ambulated short distance in hallway and limited by dizziness.  Increased time to find dynamap on unit however VSS once obtained (see mobility section below).  Pt reports d/c plan for SNF.  Follow Up Recommendations  SNF     Equipment Recommendations  None recommended by PT    Recommendations for Other Services       Precautions / Restrictions Precautions Precautions: Fall Precaution Comments: O2 dependent, 2L at baseline    Mobility  Bed Mobility Overal bed mobility: Needs Assistance Bed Mobility: Supine to Sit Rolling: Supervision            Transfers Overall transfer level: Needs assistance Equipment used: Rolling walker (2 wheeled) Transfers: Sit to/from Stand Sit to Stand: Min guard         General transfer comment: min/guard for safety  Ambulation/Gait Ambulation/Gait assistance: Min guard Gait Distance (Feet): 60 Feet Assistive device: Rolling walker (2 wheeled) Gait Pattern/deviations: Step-through pattern;Wide base of support;Trunk flexed     General Gait Details: pt reports dizziness so limited distance, upon return to recliner: SPO2 98% on 3L O2 [place], HR 66 bpm, and BP 133/71 bpm (left forearm); pt requesting O2 be set on 2L as she states she  was on 2L this morning and would like to try 2L again, SPO2 97% on 2L O2 and pt aware to use call bell if having SOB.   Stairs             Wheelchair Mobility    Modified Rankin (Stroke Patients Only)       Balance                                            Cognition Arousal/Alertness: Awake/alert Behavior During Therapy: WFL for tasks assessed/performed Overall Cognitive Status: Within Functional Limits for tasks assessed                                        Exercises      General Comments        Pertinent Vitals/Pain Pain Assessment: No/denies pain Pain Intervention(s): Monitored during session;Repositioned    Home Living                      Prior Function            PT Goals (current goals can now be found in the care plan section) Progress towards PT goals: Progressing toward goals    Frequency    Min 2X/week      PT Plan Current plan remains appropriate    Co-evaluation  AM-PAC PT "6 Clicks" Mobility   Outcome Measure  Help needed turning from your back to your side while in a flat bed without using bedrails?: A Little Help needed moving from lying on your back to sitting on the side of a flat bed without using bedrails?: A Little Help needed moving to and from a bed to a chair (including a wheelchair)?: A Little Help needed standing up from a chair using your arms (e.g., wheelchair or bedside chair)?: A Little Help needed to walk in hospital room?: A Little Help needed climbing 3-5 steps with a railing? : A Lot 6 Click Score: 17    End of Session Equipment Utilized During Treatment: Oxygen Activity Tolerance: Patient tolerated treatment well Patient left: in chair;with call bell/phone within reach   PT Visit Diagnosis: Muscle weakness (generalized) (M62.81);Difficulty in walking, not elsewhere classified (R26.2);History of falling (Z91.81)     Time: [id number] PT Time  Calculation (min) (ACUTE ONLY): 17 min  Charges:  $Gait Training: 8-22 mins                    Carmelia Bake, PT, DPT Acute Rehabilitation Services Office: [phone] Pager: [phone]   Trena Platt 03/18/2019, 3:14 PM

## 2019-03-18 NOTE — Care Management Important Message (Signed)
Important Message  Patient Details IM Letter given to [name] RN to present to the Patient Name: [name] White MRN: [id number] Date of Birth: [birth year]   Medicare Important Message Given:  Yes     Kerin Salen 03/18/2019, 11:43 AM

## 2019-03-19 DIAGNOSIS — R739 Hyperglycemia, unspecified: Secondary | ICD-10-CM | POA: Clinically undetermined

## 2019-03-19 LAB — BASIC METABOLIC PANEL
Anion gap: 9 (ref 5–15)
BUN: 32 mg/dL — ABNORMAL HIGH (ref 8–23)
CO2: 33 mmol/L — ABNORMAL HIGH (ref 22–32)
Calcium: 8 mg/dL — ABNORMAL LOW (ref 8.9–10.3)
Chloride: 97 mmol/L — ABNORMAL LOW (ref 98–111)
Creatinine, Ser: 0.73 mg/dL (ref 0.44–1.00)
GFR calc Af Amer: >60 mL/min (ref >60)
GFR calc non Af Amer: >60 mL/min (ref >60)
Glucose, Bld: 210 mg/dL — ABNORMAL HIGH (ref 70–99)
Potassium: 3.3 mmol/L — ABNORMAL LOW (ref 3.5–5.1)
Sodium: 139 mmol/L (ref 135–145)

## 2019-03-19 LAB — CBC
HCT: 35.6 % — ABNORMAL LOW (ref 36.0–46.0)
Hemoglobin: 10.5 g/dL — ABNORMAL LOW (ref 12.0–15.0)
MCH: 26.9 pg (ref 26.0–34.0)
MCHC: 29.5 g/dL — ABNORMAL LOW (ref 30.0–36.0)
MCV: 91.3 fL (ref 80.0–100.0)
Platelets: 640 10*3/uL — ABNORMAL HIGH (ref 150–400)
RBC: 3.9 MIL/uL (ref 3.87–5.11)
RDW: 19.6 % — ABNORMAL HIGH (ref 11.5–15.5)
WBC: 14.4 10*3/uL — ABNORMAL HIGH (ref 4.0–10.5)
nRBC: 0.5 % — ABNORMAL HIGH (ref 0.0–0.2)

## 2019-03-19 LAB — GLUCOSE, CAPILLARY
Glucose-Capillary: 161 mg/dL — ABNORMAL HIGH (ref 70–99)
Glucose-Capillary: 235 mg/dL — ABNORMAL HIGH (ref 70–99)

## 2019-03-19 LAB — MAGNESIUM: Magnesium: 2.3 mg/dL (ref 1.7–2.4)

## 2019-03-19 MED ORDER — INSULIN ASPART 100 UNIT/ML ~~LOC~~ SOLN
0.0000 [IU] | Freq: Three times a day (TID) | SUBCUTANEOUS | Status: DC
  Administered 2019-03-19: 3 [IU] via SUBCUTANEOUS
  Administered 2019-03-20: 2 [IU] via SUBCUTANEOUS
  Administered 2019-03-20: 3 [IU] via SUBCUTANEOUS
  Administered 2019-03-20: 8 [IU] via SUBCUTANEOUS
  Administered 2019-03-21 – 2019-03-22 (×3): 2 [IU] via SUBCUTANEOUS

## 2019-03-19 MED ORDER — POTASSIUM CHLORIDE 20 MEQ PO PACK
40.0000 meq | PACK | Freq: Once | ORAL | Status: AC
  Administered 2019-03-19: 09:00:00 40 meq via ORAL
  Filled 2019-03-19: qty 2

## 2019-03-19 MED ORDER — IPRATROPIUM-ALBUTEROL 0.5-2.5 (3) MG/3ML IN SOLN
3.0000 mL | Freq: Three times a day (TID) | RESPIRATORY_TRACT | Status: DC
  Administered 2019-03-20 – 2019-03-22 (×7): 3 mL via RESPIRATORY_TRACT
  Filled 2019-03-19 (×8): qty 3

## 2019-03-19 NOTE — Progress Notes (Signed)
[facility]  PROGRESS NOTE  [name] White [id number] DOB: 1955-11-03 DOA: 03/14/2019 PCP: [name], MD  Brief History   [name] White is a 63 y.o. female with medical history significant for paroxysmal A. fib on eliquis, history of PE (2015),COPD, chronic respiratory failure on 2 L O2 at home, asthma, MDD, anxiety and reported OSA (n formal sleep study)who presents on 03/14/2019 with progressive dyspnea, nonproductive cough over the past 1 to 2 days.  She states her shortness of breath progressively worsened throughout the day and shortness with the most while walking back and forth in her home.  It did not improve with her home inhalers.  She also noticed some associated chest tightness.  She measured her oxygen at home and found it to be in the low 80s.  Denied any fevers, no nausea, no vomiting, no sick contacts.  Denies any new lower leg swelling.  Does not weigh herself daily.  Does not take any Lasix for heart failure.  Patient was recently discharged (admission 9/8-9/10) for abdminal wall hematoma sustained after a fall while on anticoagulation for PE/AF.  During that admission, she reported BRBPR, and epiode of melena but no bleeding durng the admission ( did have positive FOBT) patient was switched from Xarelto to eliquis on discharge with outpatient colonoscopy arranged given stable hemoglobin of 10.2.   She was recently seen in the ED on 9/15 for concerns for bright red blood per rectum related to her recently diagnosed abdominal wall hematoma in the setting of anticoagulation therapy.  She instructed to hold off on further use of her Eliquis and resume in 2 days.  Patient has not taken Eliquis since the evening of 9/16    ED Course: Afebrile, respiratory rate 22, SPO2 67% on 2 L increased to 3 L with normal oxygen saturation.  Lab work notable for potassium 3.2, WBC 11.2, hemoglobin 10.5, BNP 68, COVID test negative, high-sensitivity from 8, chest x-ray showed  cardiomegaly with probable central pulmonary vascular congestion with atelectasis.     A & P     Acute on chronic hypoxic respiratory failure, suspect multifactorial etiology.    On admission SPO2 67% on 2 L (home regimen) required increased to 3 L likely combination of CHF flare, COPD exacerbation.  PE ruled out with CTA imaging though cannot definitively rule out small blood clots based off respiratory abnormalities.  Still requiring 3 L O2 with increase shortness of breath with only minimal exertion, repeat chest x-ray shows no new abnormalities, we will continue IV diuresis for CHF and scheduled inhalers for COPD as she seems to have improvement in respiratory effort today. Encouraged OOB to chair,  Solu-Medrol taper close monitoring respiratory status, wean O2 as able.   Chest tightness, resolved.  Likely due to above respiratory issues above.  Doubt any cardiac involvement given EKG with no acute ischemic etiologies has sensitive troponin trend unremarkable    Acute COPD exacerbation.  Worsening dyspnea, nonproductive cough, increased oxygen requirements, diffuse wheezing-which is improved from admission but still notably short of breath with minimal exertion, seems to be having a more productive cough, chest x-ray shows no acute abnormalities.   On doxycycline. Continue scheduled duo nebs, IV Solu-Medrol taper to prednisone in 24-48 hours, flutter valve, incentive spirometer, scheduled Mucinex, encourage out of bed to chair close monitoring respiratory status.   Acute on chronic CHF with preserved EF.  BNP within normal limits but unreliable in the setting of obesity, DOE, pulmonary vascular congestion on admitting chest  x-ray concerning for flare.  TTE (3/20) EF 55 to 60%.    Net -8 L this admission, Net -2 L in last 24 hours, but still easily dyspneic with minimal movement on exam, will continue IV lasix 40 mg BID, daily wgts,monitor output, fluid restrict, low-sodium diet.    Hyperglycemia in setting of prolonged steroid use.  Check CBGs, sliding scale as needed.   Atrial fibrillation.  Currently normal sinus rhythm, rate controlled.  Continue home diltiazem, flecainide, Lopressor.  Monitor on telemetry, resume Eliquis   History of PE (2015).  Other potential etiologies of shortness of breath seen more likely CTA chest negative for overt clot disease, unable to rule out smaller vessels given respiratory artifact, closely monitor. patient tolerating her home Eliquis   Hypokalemia,.  Likely in the setting of IV diuresis, closely monitor potassium and replete as necessary.   HTN, at goal.   home Lopressor, diltiazem.  Closely monitor   Obesity hypoventilation syndrome/OSA.  No formal diagnosis of OSA.  Needs outpatient sleep study.  Continue supplemental oxygen, monitor respiratory status.   Abdominal wall hematoma, stable.  Some slight pain with palpation.  Denies any recurrent bright red blood per rectum since recent ED visit.  Hemoglobin stable, monitor CBC.   Depression, stable.  Continue home Effexor     DVT prophylaxis: Home Eliquis Code Status: DNR discussed on day of admission Family Communication: No family at bedside Disposition Plan: Needs continued inpatient stay for continued IV diuresis and scheduled inhalers/steroids for greater improvement in respiratory status given persistence of symptoms with minimal exertion, closely monitor status    [facility] Direct contact: see [email or web address] (further directions at bottom of note if needed) 7PM-7AM contact night coverage as at bottom of note 03/19/2019, 1:21 PM  LOS: 5 days   Consultants  . None  Procedures  . None  Antibiotics  . None  Interval History/Subjective  Voice still hoarse Feels her breathing is slowly getting better Wants to get up in chair today again  Objective   Vitals:  Vitals:   03/19/19 0531 03/19/19 0749  BP: 118/62   Pulse: 66   Resp: 18   Temp:  98.4 F (36.9 C)   SpO2: 95% 94%    Exam:  Awake Alert, Oriented X 3, No new F.N deficits, Normal affect [place].AT Unable to assess JVD given body habitus, no peripheral edema Symmetrical Chest wall movement,, no overt accessory muscle usage, scant wheezing worsens while sitting up during exam, on 3 L nasal cannula O2 RRR,No Gallops,Rubs or new Murmurs,  +ve B.Sounds, Abd Soft, No tenderness,  No rebound - guarding or rigidity. Stable bruising in upper abdomen    I have personally reviewed the following:   Data Reviewed: Basic Metabolic Panel: Recent Labs  Lab 03/14/19 0750 03/15/19 0400 03/16/19 0339 03/17/19 0423 03/19/19 0417  NA 140 141 140 140 139  K 3.2* 3.3* 4.0 3.8 3.3*  CL 103 103 103 101 97*  CO2 26 26 28 30  33*  GLUCOSE 142* 202* 171* 241* 210*  BUN 16 20 36* 36* 32*  CREATININE 0.57 0.78 0.82 0.69 0.73  CALCIUM 8.6* 8.4* 8.3* 8.2* 8.0*  MG  --   --   --   --  2.3   Liver Function Tests: Recent Labs  Lab 03/13/19 0150 03/14/19 0750  AST 30 19  ALT 26 23  ALKPHOS 86 87  BILITOT 0.6 0.8  PROT 8.0 7.9  ALBUMIN 3.6 3.6   No results for input(s): LIPASE, AMYLASE  in the last 168 hours. No results for input(s): AMMONIA in the last 168 hours. CBC: Recent Labs  Lab 03/13/19 0150 03/14/19 0750 03/15/19 0400 03/16/19 0339 03/17/19 0423 03/18/19 0415 03/19/19 0417  WBC 11.9* 11.2* 10.2 15.1* 12.8* 13.0* 14.4*  NEUTROABS 9.2* 9.8*  --   --   --   --   --   HGB 10.6* 10.5* 9.8* 9.8* 9.8* 10.1* 10.5*  HCT 36.0 36.4 33.6* 34.0* 33.5* 34.6* 35.6*  MCV 92.1 94.8 92.8 94.2 92.3 92.0 91.3  PLT 571* 524* 553* 571* 598* 655* 640*   Cardiac Enzymes: No results for input(s): CKTOTAL, CKMB, CKMBINDEX, TROPONINI in the last 168 hours. BNP (last 3 results) Recent Labs    12/20/18 1340 02/26/19 0856 03/14/19 0750  BNP 27.8 34.5 68.6    ProBNP (last 3 results) No results for input(s): PROBNP in the last 8760 hours.  CBG: No results for input(s): GLUCAP in  the last 168 hours.  Recent Results (from the past 240 hour(s))  SARS Coronavirus 2 [facility] order, Performed in [facility] hospital lab) Nasopharyngeal Nasopharyngeal Swab     Status: None   Collection Time: 03/14/19  7:35 AM   Specimen: Nasopharyngeal Swab  Result Value Ref Range Status   SARS Coronavirus 2 NEGATIVE NEGATIVE Final    Comment: (NOTE) If result is NEGATIVE SARS-CoV-2 target nucleic acids are NOT DETECTED. The SARS-CoV-2 RNA is generally detectable in upper and lower  respiratory specimens during the acute phase of infection. The lowest  concentration of SARS-CoV-2 viral copies this assay can detect is 250  copies / mL. A negative result does not preclude SARS-CoV-2 infection  and should not be used as the sole basis for treatment or other  patient management decisions.  A negative result may occur with  improper specimen collection / handling, submission of specimen other  than nasopharyngeal swab, presence of viral mutation(s) within the  areas targeted by this assay, and inadequate number of viral copies  (<250 copies / mL). A negative result must be combined with clinical  observations, patient history, and epidemiological information. If result is POSITIVE SARS-CoV-2 target nucleic acids are DETECTED. The SARS-CoV-2 RNA is generally detectable in upper and lower  respiratory specimens dur ing the acute phase of infection.  Positive  results are indicative of active infection with SARS-CoV-2.  Clinical  correlation with patient history and other diagnostic information is  necessary to determine patient infection status.  Positive results do  not rule out bacterial infection or co-infection with other viruses. If result is PRESUMPTIVE POSTIVE SARS-CoV-2 nucleic acids MAY BE PRESENT.   A presumptive positive result was obtained on the submitted specimen  and confirmed on repeat testing.  While 2019 novel coronavirus  (SARS-CoV-2) nucleic acids may be present in  the submitted sample  additional confirmatory testing may be necessary for epidemiological  and / or clinical management purposes  to differentiate between  SARS-CoV-2 and other Sarbecovirus currently known to infect humans.  If clinically indicated additional testing with an alternate test  methodology [phone]) is advised. The SARS-CoV-2 RNA is generally  detectable in upper and lower respiratory sp ecimens during the acute  phase of infection. The expected result is Negative. Fact Sheet for Patients:  StrictlyIdeas.no Fact Sheet for Healthcare Providers: BankingDealers.co.za This test is not yet approved or cleared by the [place] FDA and has been authorized for detection and/or diagnosis of SARS-CoV-2 by FDA under an Emergency Use Authorization (EUA).  This EUA will remain in  effect (meaning this test can be used) for the duration of the COVID-19 declaration under Section 564(b)(1) of the Act, 21 U.S.C. section 360bbb-3(b)(1), unless the authorization is terminated or revoked sooner. Performed at [facility], [place] [address] [place], [place] [address]      Studies: No results found.  Scheduled Meds: . apixaban  5 mg Oral BID  . cycloSPORINE  1 drop Both Eyes BID  . diltiazem  120 mg Oral QHS  . doxycycline  100 mg Oral Q12H  . famotidine  20 mg Oral Daily  . flecainide  100 mg Oral BID  . fluticasone  2 spray Each Nare QHS  . furosemide  40 mg Intravenous BID  . guaiFENesin  600 mg Oral BID  . ipratropium-albuterol  3 mL Nebulization Q6H  . loratadine  10 mg Oral Daily  . methylPREDNISolone (SOLU-MEDROL) injection  60 mg Intravenous Q8H   Followed by  . [START ON 03/20/2019] methylPREDNISolone (SOLU-MEDROL) injection  60 mg Intravenous Q12H   Followed by  . [START ON 03/21/2019] predniSONE  40 mg Oral Q breakfast  . metoprolol tartrate  25 mg Oral BID  . venlafaxine XR  187.5 mg Oral Q breakfast    Continuous Infusions:  Active Problems:   Morbid obesity (HCC)   HYPERTENSION, BENIGN SYSTEMIC   Atrial flutter (HCC)   Hematoma   Chronic anticoagulation   History of hypertension   Hypokalemia   Chronic respiratory failure with hypoxia (HCC)   COPD with acute exacerbation (HCC)   Heart failure with preserved ejection fraction (HCC)   Obesity hypoventilation syndrome ([place])   History of pulmonary embolus (PE)   Acute on chronic respiratory failure with hypoxemia (HCC)   Acute on chronic heart failure with preserved ejection fraction ([place])      Desiree Hane  [facility]

## 2019-03-19 NOTE — Telephone Encounter (Signed)
[facility] [place] called to check status of forms.  Will forward to PCP and Dr. Nori Riis. Christen Bame, CMA

## 2019-03-19 NOTE — TOC Progression Note (Signed)
Transition of Care [facility]) - Progression Note    Patient Details  Name: [name] White MRN: [id number] Date of Birth: 1956/06/18  Transition of Care [facility]) CM/SW Contact  Purcell Mouton, RN Phone Number: 03/19/2019, 11:54 AM  Clinical Narrative:    Faxed information to [facility] [phone].  Expected Discharge Plan: [place]    Expected Discharge Plan and Services Expected Discharge Plan: [place]   Discharge Planning Services: CM Consult Post Acute Care Choice: [place] arrangements for the past 2 months: Single Family Home Expected Discharge Date: (unknown)                         HH Arranged: RN, PT, OT, Nurse's Aide [place] Agency: [place] ([facility]) Date HH Agency Contacted: 03/15/19 Time [place]: 1041 Representative spoke with at [place]: Santiago Glad, RN   Social Determinants of Health ([place]) Interventions    Readmission Risk Interventions Readmission Risk Prevention Plan 03/08/2019  Transportation Screening Complete  Medication Review Press photographer) Complete  PCP or Specialist appointment within 3-5 days of discharge Complete  HRI or [place] Complete  SW Recovery Care/Counseling Consult Complete  [place] Not Applicable  Some recent data might be hidden

## 2019-03-20 LAB — GLUCOSE, CAPILLARY
Glucose-Capillary: 191 mg/dL — ABNORMAL HIGH (ref 70–99)
Glucose-Capillary: 286 mg/dL — ABNORMAL HIGH (ref 70–99)
Glucose-Capillary: 138 mg/dL — ABNORMAL HIGH (ref 70–99)
Glucose-Capillary: 155 mg/dL — ABNORMAL HIGH (ref 70–99)

## 2019-03-20 LAB — HEMOGLOBIN A1C
Hgb A1c MFr Bld: 6.3 % — ABNORMAL HIGH (ref 4.8–5.6)
Mean Plasma Glucose: 134 mg/dL

## 2019-03-20 MED ORDER — FUROSEMIDE 40 MG PO TABS
40.0000 mg | ORAL_TABLET | Freq: Every day | ORAL | Status: DC
  Administered 2019-03-21 – 2019-03-22 (×2): 40 mg via ORAL
  Filled 2019-03-20 (×2): qty 1

## 2019-03-20 NOTE — Progress Notes (Signed)
Covid swab sent for [facility]. Pt up to [facility] and had a large, soft brown BM. Red streaks noted on surface of stool. Pt states that she is due a endo/colonoscopy for recent dark stools. Md made aware. Will continue to monitor. Eulas Post, RN

## 2019-03-20 NOTE — Consult Note (Signed)
   [facility] CM Inpatient Consult   03/20/2019  BHARGAVI WALLOCK 1955/12/04 [id number]   [facility] Follow up:  Patient assessed is currently active with a [place] social worker who assists with community resources.   Spoke with Ms. Sveum by telephone. HIPAA verified. Explained the [place] program again to patient. Assessed for community needs for transportation, meals, medication assistance and community RN follow-up for chronic disease management when she returns home. Ms. Prioleau states that her daughter will be helping her to find an apartment for her living arrangements. She discussed receiving support from the [employer] for assistance with transportation. She denies needing assistance with meals at this time, but states that she will let [facility] social worker know if her needs change. Patient agrees to community follow-up from a [facility] CM case manager post hospital follow up.   Patient gives verbal permission to contact daughter, Hartford Poli, if she is unable to be reached by telephone.  Will continue to follow for progression and disposition plans and make referral if needed.  Netta Cedars, MSN, [place] [facility] Liaison Nurse Mobile Phone [phone]  Toll free office [phone]

## 2019-03-20 NOTE — Progress Notes (Signed)
PROGRESS NOTE    [name] White  [id number] DOB: 1956/04/05 DOA: 03/14/2019 PCP: [name], MD    Brief Narrative:  63 year old female who presented with dyspnea.  She does have significant past medical history for paroxysmal atrial fibrillation, pulmonary embolism, COPD, chronic hypoxic respiratory failure, asthma, anxiety and obstructive sleep apnea.  Patient reported progressive dyspnea for about 24 to 48 hours, associated with chest tightness, refractive to use of bronchodilators at home.  Her oximetry saturation at home was 80%.  She has stopped her anticoagulation due to abdominal wall hematoma.  On her initial physical examination her respiratory rate was 22, oxygen saturation down to 67% up to 97% on 3, LPM per [place], blood pressure 134/68, heart rate 88.  Her lungs had diffuse wheezing bilaterally, heart S1-S2 present and rhythmic, abdomen soft, no lower extremity edema, positive abdominal wall hematoma.  Her chest x-ray had left rotation, bilateral interstitial infiltrates, right midlung atelectasis. EKG 81 bpm, normal axis, normal intervals, sinus rhythm with PACs, no ST segment or T wave changes.  Patient was admitted to the hospital working diagnosis of acute on chronic hypoxic respiratory failure due to acute on chronic diastolic heart failure.   Assessment & Plan:   Active Problems:   Morbid obesity (HCC)   HYPERTENSION, BENIGN SYSTEMIC   Atrial flutter (HCC)   Hematoma   Chronic anticoagulation   History of hypertension   Hypokalemia   Chronic respiratory failure with hypoxia (HCC)   COPD with acute exacerbation (HCC)   Heart failure with preserved ejection fraction (HCC)   Obesity hypoventilation syndrome ([place])   History of pulmonary embolus (PE)   Acute on chronic respiratory failure with hypoxemia (HCC)   Acute on chronic heart failure with preserved ejection fraction (HCC)   Hyperglycemia   1. Acute on chronic hypoxic respiratory failure due to  diastolic heart failure exacerbation. Patient has responded well to diuresis, fluid balance since admission is negative 11, 421 ml. Will continue diuresis with furosemide po, to keep negative fluid balance, continue fluid and salt restriction. Continue blood pressure monitoring.  2. Acute COPD exacerbaion. No significant wheezing today, no increase cough or mucus production, will continue bronochodilator therapy and oxymetry monitoring. Dc steroids for now.   3. Atrial fibrillation. Continue rate control with diltiazem and flecainide, continue AV blockade with metoprolol.  Anticoagulation with apixaban with good toleration.   4. Steroid induced hyperglycemia. Will dc systemic steroids, continue gucose cover and monitoring.   5. History of PE. Continue anticoagulation with apixaban with good toleration.   6. Ambulatory dysfunction with abdominal wall hematoma. No signs of worsening, continue with anticoagulation for afib for now.   6. Depression. Contineu effexor.   7. Morbid obesity with OSA. Calucated BMI is 54, will continue copap at home.   10 T2DM. Will continue glucose cover and monitoring with insulin sliding scale.    DVT prophylaxis: apixaban   Code Status: dnr  Family Communication: no family at the bedside   Disposition Plan/ discharge barriers: pending clinical improvement, will order second COVID test to arrange for SNF.   Body mass index is 59.43 kg/m. Malnutrition Type:      Malnutrition Characteristics:      Nutrition Interventions:     RN Pressure Injury Documentation:     Consultants:     Procedures:     Antimicrobials:       Subjective: Patient slowly getting better, no nausea or vomiting, no chest pain. Dyspnea improving but not yet back to baseline. Continue  to be very weak and deconditioned.   Objective: Vitals:   03/20/19 0506 03/20/19 0806 03/20/19 0807 03/20/19 1007  BP:    121/68  Pulse:    76  Resp:      Temp:      TempSrc:       SpO2:  94% 94%   Weight: (!) 162 kg     Height:        Intake/Output Summary (Last 24 hours) at 03/20/2019 1408 Last data filed at 03/20/2019 1100 Gross per 24 hour  Intake 647 ml  Output 2800 ml  Net -2153 ml   Filed Weights   03/17/19 0531 03/18/19 0458 03/20/19 0506  Weight: (!) 160 kg (!) 161.8 kg (!) 162 kg    Examination:   General: Not in pain or dyspnea, deconditioned  Neurology: Awake and alert, non focal  E ENT: mild pallor, no icterus, oral mucosa moist Cardiovascular: No JVD. S1-S2 present, rhythmic, no gallops, rubs, or murmurs. +/++ non pitting lower extremity edema. Pulmonary: positive breath sounds bilaterally, decreased air movement, no wheezing, rhonchi or rales. Gastrointestinal. Abdomen with no organomegaly, non tender, no rebound or guarding Skin. No rashes Musculoskeletal: no joint deformities     Data Reviewed: I have personally reviewed following labs and imaging studies  CBC: Recent Labs  Lab 03/14/19 0750 03/15/19 0400 03/16/19 0339 03/17/19 0423 03/18/19 0415 03/19/19 0417  WBC 11.2* 10.2 15.1* 12.8* 13.0* 14.4*  NEUTROABS 9.8*  --   --   --   --   --   HGB 10.5* 9.8* 9.8* 9.8* 10.1* 10.5*  HCT 36.4 33.6* 34.0* 33.5* 34.6* 35.6*  MCV 94.8 92.8 94.2 92.3 92.0 91.3  PLT 524* 553* 571* 598* 655* [id number]*   Basic Metabolic Panel: Recent Labs  Lab 03/14/19 0750 03/15/19 0400 03/16/19 0339 03/17/19 0423 03/19/19 0417  NA 140 141 140 140 139  K 3.2* 3.3* 4.0 3.8 3.3*  CL 103 103 103 101 97*  CO2 26 26 28 30  33*  GLUCOSE 142* 202* 171* 241* 210*  BUN 16 20 36* 36* 32*  CREATININE 0.57 0.78 0.82 0.69 0.73  CALCIUM 8.6* 8.4* 8.3* 8.2* 8.0*  MG  --   --   --   --  2.3   GFR: Estimated Creatinine Clearance: 111 mL/min (by C-G formula based on SCr of 0.73 mg/dL). Liver Function Tests: Recent Labs  Lab 03/14/19 0750  AST 19  ALT 23  ALKPHOS 87  BILITOT 0.8  PROT 7.9  ALBUMIN 3.6   No results for input(s): LIPASE, AMYLASE in  the last 168 hours. No results for input(s): AMMONIA in the last 168 hours. Coagulation Profile: No results for input(s): INR, PROTIME in the last 168 hours. Cardiac Enzymes: No results for input(s): CKTOTAL, CKMB, CKMBINDEX, TROPONINI in the last 168 hours. BNP (last 3 results) No results for input(s): PROBNP in the last 8760 hours. HbA1C: Recent Labs    03/19/19 0417  HGBA1C 6.3*   CBG: Recent Labs  Lab 03/19/19 1635 03/19/19 2025 03/20/19 0728 03/20/19 1147  GLUCAP 161* 235* 191* 286*   Lipid Profile: No results for input(s): CHOL, HDL, LDLCALC, TRIG, CHOLHDL, LDLDIRECT in the last 72 hours. Thyroid Function Tests: No results for input(s): TSH, T4TOTAL, FREET4, T3FREE, THYROIDAB in the last 72 hours. Anemia Panel: No results for input(s): VITAMINB12, FOLATE, FERRITIN, TIBC, IRON, RETICCTPCT in the last 72 hours.    Radiology Studies: I have reviewed all of the imaging during this hospital visit personally  Scheduled Meds: . apixaban  5 mg Oral BID  . cycloSPORINE  1 drop Both Eyes BID  . diltiazem  120 mg Oral QHS  . doxycycline  100 mg Oral Q12H  . famotidine  20 mg Oral Daily  . flecainide  100 mg Oral BID  . fluticasone  2 spray Each Nare QHS  . furosemide  40 mg Intravenous BID  . guaiFENesin  600 mg Oral BID  . insulin aspart  0-15 Units Subcutaneous TID WC  . ipratropium-albuterol  3 mL Nebulization TID  . loratadine  10 mg Oral Daily  . methylPREDNISolone (SOLU-MEDROL) injection  60 mg Intravenous Q12H   Followed by  . [START ON 03/21/2019] predniSONE  40 mg Oral Q breakfast  . metoprolol tartrate  25 mg Oral BID  . venlafaxine XR  187.5 mg Oral Q breakfast   Continuous Infusions:   LOS: 6 days         Gerome Apley, MD

## 2019-03-20 NOTE — TOC Progression Note (Signed)
Transition of Care [facility]) - Progression Note    Patient Details  Name: RAIGEN ROOPNARINE MRN: [id number] Date of Birth: 07-20-1955  Transition of Care [facility]) CM/SW Contact  Purcell Mouton, RN Phone Number: 03/20/2019, 3:50 PM  Clinical Narrative:     [place] pt a bed, will need COVID test. Pt accepted offer.  Expected Discharge Plan: [place]    Expected Discharge Plan and Services Expected Discharge Plan: [place]   Discharge Planning Services: CM Consult Post Acute Care Choice: [place] arrangements for the past 2 months: Single Family Home Expected Discharge Date: (unknown)                         HH Arranged: RN, PT, OT, Nurse's Aide [place] Agency: [place] ([facility]) Date HH Agency Contacted: 03/15/19 Time [place]: 1041 Representative spoke with at [place]: Santiago Glad, RN   Social Determinants of Health ([place]) Interventions    Readmission Risk Interventions Readmission Risk Prevention Plan 03/08/2019  Transportation Screening Complete  Medication Review Press photographer) Complete  PCP or Specialist appointment within 3-5 days of discharge Complete  HRI or [place] Complete  SW Recovery Care/Counseling Consult Complete  [place] Not Applicable  Some recent data might be hidden

## 2019-03-21 LAB — GLUCOSE, CAPILLARY
Glucose-Capillary: 140 mg/dL — ABNORMAL HIGH (ref 70–99)
Glucose-Capillary: 140 mg/dL — ABNORMAL HIGH (ref 70–99)
Glucose-Capillary: 99 mg/dL (ref 70–99)
Glucose-Capillary: 110 mg/dL — ABNORMAL HIGH (ref 70–99)

## 2019-03-21 LAB — SARS CORONAVIRUS 2 (TAT 6-24 HRS): SARS Coronavirus 2: NEGATIVE

## 2019-03-21 LAB — BASIC METABOLIC PANEL
Anion gap: 9 (ref 5–15)
BUN: 29 mg/dL — ABNORMAL HIGH (ref 8–23)
CO2: 33 mmol/L — ABNORMAL HIGH (ref 22–32)
Calcium: 8 mg/dL — ABNORMAL LOW (ref 8.9–10.3)
Chloride: 97 mmol/L — ABNORMAL LOW (ref 98–111)
Creatinine, Ser: 0.57 mg/dL (ref 0.44–1.00)
GFR calc Af Amer: >60 mL/min (ref >60)
GFR calc non Af Amer: >60 mL/min (ref >60)
Glucose, Bld: 167 mg/dL — ABNORMAL HIGH (ref 70–99)
Potassium: 3.3 mmol/L — ABNORMAL LOW (ref 3.5–5.1)
Sodium: 139 mmol/L (ref 135–145)

## 2019-03-21 MED ORDER — POTASSIUM CHLORIDE CRYS ER 20 MEQ PO TBCR
40.0000 meq | EXTENDED_RELEASE_TABLET | ORAL | Status: AC
  Administered 2019-03-21 (×2): 40 meq via ORAL
  Filled 2019-03-21 (×2): qty 2

## 2019-03-21 MED ORDER — HYDROCORTISONE (PERIANAL) 2.5 % EX CREA
TOPICAL_CREAM | Freq: Three times a day (TID) | CUTANEOUS | Status: DC
  Administered 2019-03-21: 23:00:00 via RECTAL
  Filled 2019-03-21: qty 28.35

## 2019-03-21 MED ORDER — POTASSIUM CHLORIDE CRYS ER 20 MEQ PO TBCR: 40.0000 meq | EXTENDED_RELEASE_TABLET | Freq: Once | ORAL | Status: DC

## 2019-03-21 NOTE — Care Management Important Message (Signed)
Important Message  Patient Details IM Letter given to [name] RN to present to the Patient Name: [name] White MRN: [id number] Date of Birth: 1955/07/03   Medicare Important Message Given:  Yes     Kerin Salen 03/21/2019, 11:15 AM

## 2019-03-21 NOTE — Progress Notes (Signed)
Patient had large brown stool with bright red blood noted. Small external hemorrhoids noted and patient c/o burning in rectum. Md aware. Eulas Post, RN

## 2019-03-21 NOTE — Progress Notes (Signed)
PROGRESS NOTE    [employer]  [id number] DOB: 03/01/1956 DOA: 03/14/2019 PCP: [name], MD    Brief Narrative:  63 year old female who presented with dyspnea.  She does have significant past medical history for paroxysmal atrial fibrillation, pulmonary embolism, COPD, chronic hypoxic respiratory failure, asthma, anxiety and obstructive sleep apnea.  Patient reported progressive dyspnea for about 24 to 48 hours, associated with chest tightness, refractive to use of bronchodilators at home.  Her oximetry saturation at home was 80%.  She has stopped her anticoagulation due to abdominal wall hematoma.  On her initial physical examination her respiratory rate was 22, oxygen saturation down to 67% up to 97% on 3, LPM per [place], blood pressure 134/68, heart rate 88.  Her lungs had diffuse wheezing bilaterally, heart S1-S2 present and rhythmic, abdomen soft, no lower extremity edema, positive abdominal wall hematoma.  Her chest x-ray had left rotation, bilateral interstitial infiltrates, right midlung atelectasis. EKG 81 bpm, normal axis, normal intervals, sinus rhythm with PACs, no ST segment or T wave changes.  Patient was admitted to the hospital working diagnosis of acute on chronic hypoxic respiratory failure due to acute on chronic diastolic heart failure.   Assessment & Plan:   Active Problems:   Morbid obesity (HCC)   HYPERTENSION, BENIGN SYSTEMIC   Atrial flutter (HCC)   Hematoma   Chronic anticoagulation   History of hypertension   Hypokalemia   Chronic respiratory failure with hypoxia (HCC)   COPD with acute exacerbation (HCC)   Heart failure with preserved ejection fraction (HCC)   Obesity hypoventilation syndrome ([place])   History of pulmonary embolus (PE)   Acute on chronic respiratory failure with hypoxemia (HCC)   Acute on chronic heart failure with preserved ejection fraction (HCC)   Hyperglycemia     1. Acute on chronic hypoxic respiratory failure due to  diastolic heart failure exacerbation. Continue with furosemide po to keep negative fluid balance, continue fluid and salt restriction. Continue blood pressure monitoring.  2. Acute COPD exacerbaion. Continue bronochodilator therapy and oxymetry monitoring.   3. Atrial fibrillation.  Rate control with diltiazem, metoprolol and flecainide, Anticoagulation with apixaban.   4. Steroid induced hyperglycemia in the setting of T2DM. Improved glucose, down to 138, 155, 140 and 167. Patient is tolerating po well. Continue insulin sliding scale for glucose monitoring.   5. History of PE. On apixaban with good toleration.   6. Ambulatory dysfunction with abdominal wall hematoma. Pending transfer to SNF.   6. Depression. On effexor.   7. Morbid obesity with OSA. Calucated BMI is 54,.     DVT prophylaxis: apixaban   Code Status: dnr  Family Communication: no family at the bedside   Disposition Plan/ discharge barriers: pending clinical improvement, will order second COVID test to arrange for SNF.     Body mass index is 60.64 kg/m. Malnutrition Type:      Malnutrition Characteristics:      Nutrition Interventions:     RN Pressure Injury Documentation:     Consultants:    Procedures:     Antimicrobials:       Subjective: Patient is feeling better but continue to be very weak and deconditioned, no nausea or vomiting.   Objective: Vitals:   03/21/19 0558 03/21/19 0759 03/21/19 1412 03/21/19 1607  BP: 118/80   (!) 149/76  Pulse: 64   67  Resp: 19   (!) 22  Temp: 99 F (37.2 C)   97.7 F (36.5 C)  TempSrc:  Oral  SpO2: 97% 99% 97% 94%  Weight: (!) 165.3 kg     Height:        Intake/Output Summary (Last 24 hours) at 03/21/2019 1627 Last data filed at 03/21/2019 1429 Gross per 24 hour  Intake 1200 ml  Output 950 ml  Net 250 ml   Filed Weights   03/18/19 0458 03/20/19 0506 03/21/19 0558  Weight: (!) 161.8 kg (!) 162 kg (!) 165.3 kg     Examination:   General: Not in pain or dyspnea, deconditioned  Neurology: Awake and alert, non focal  E ENT: no pallor, no icterus, oral mucosa moist Cardiovascular: No JVD. S1-S2 present, rhythmic, no gallops, rubs, or murmurs. No lower extremity edema. Pulmonary: positive breath sounds bilaterally, adequate air movement, no wheezing, rhonchi or rales. Anterior auscultation Gastrointestinal. Abdomen with no organomegaly, non tender, no rebound or guarding Skin. No rashes Musculoskeletal: no joint deformities     Data Reviewed: I have personally reviewed following labs and imaging studies  CBC: Recent Labs  Lab 03/15/19 0400 03/16/19 0339 03/17/19 0423 03/18/19 0415 03/19/19 0417  WBC 10.2 15.1* 12.8* 13.0* 14.4*  HGB 9.8* 9.8* 9.8* 10.1* 10.5*  HCT 33.6* 34.0* 33.5* 34.6* 35.6*  MCV 92.8 94.2 92.3 92.0 91.3  PLT 553* 571* 598* 655* [id number]*   Basic Metabolic Panel: Recent Labs  Lab 03/15/19 0400 03/16/19 0339 03/17/19 0423 03/19/19 0417 03/21/19 0502  NA 141 140 140 139 139  K 3.3* 4.0 3.8 3.3* 3.3*  CL 103 103 101 97* 97*  CO2 26 28 30  33* 33*  GLUCOSE 202* 171* 241* 210* 167*  BUN 20 36* 36* 32* 29*  CREATININE 0.78 0.82 0.69 0.73 0.57  CALCIUM 8.4* 8.3* 8.2* 8.0* 8.0*  MG  --   --   --  2.3  --    GFR: Estimated Creatinine Clearance: 112.5 mL/min (by C-G formula based on SCr of 0.57 mg/dL). Liver Function Tests: No results for input(s): AST, ALT, ALKPHOS, BILITOT, PROT, ALBUMIN in the last 168 hours. No results for input(s): LIPASE, AMYLASE in the last 168 hours. No results for input(s): AMMONIA in the last 168 hours. Coagulation Profile: No results for input(s): INR, PROTIME in the last 168 hours. Cardiac Enzymes: No results for input(s): CKTOTAL, CKMB, CKMBINDEX, TROPONINI in the last 168 hours. BNP (last 3 results) No results for input(s): PROBNP in the last 8760 hours. HbA1C: Recent Labs    03/19/19 0417  HGBA1C 6.3*   CBG: Recent Labs  Lab  03/20/19 1147 03/20/19 1658 03/20/19 2136 03/21/19 0730 03/21/19 1156  GLUCAP 286* 138* 155* 140* 140*   Lipid Profile: No results for input(s): CHOL, HDL, LDLCALC, TRIG, CHOLHDL, LDLDIRECT in the last 72 hours. Thyroid Function Tests: No results for input(s): TSH, T4TOTAL, FREET4, T3FREE, THYROIDAB in the last 72 hours. Anemia Panel: No results for input(s): VITAMINB12, FOLATE, FERRITIN, TIBC, IRON, RETICCTPCT in the last 72 hours.    Radiology Studies: I have reviewed all of the imaging during this hospital visit personally     Scheduled Meds: . apixaban  5 mg Oral BID  . cycloSPORINE  1 drop Both Eyes BID  . diltiazem  120 mg Oral QHS  . famotidine  20 mg Oral Daily  . flecainide  100 mg Oral BID  . fluticasone  2 spray Each Nare QHS  . furosemide  40 mg Oral Daily  . guaiFENesin  600 mg Oral BID  . insulin aspart  0-15 Units Subcutaneous TID WC  . ipratropium-albuterol  3 mL Nebulization TID  . loratadine  10 mg Oral Daily  . metoprolol tartrate  25 mg Oral BID  . venlafaxine XR  187.5 mg Oral Q breakfast   Continuous Infusions:   LOS: 7 days        Haldon Carley Gerome Apley, MD

## 2019-03-21 NOTE — Progress Notes (Signed)
Occupational Therapy Treatment Patient Details Name: [name] White MRN: [id number] DOB: Jan 30, 1956 Today's Date: 03/21/2019    History of present illness 63 y.o. female with medical hitory significant for paroxysmal A. fib on eliquis, history of PE (2015), COPD, chronic respiratory failure on 2 L O2 at home, asthma, MDD, anxiety and reported OSA (n formal sleep study)who presents on 03/14/2019 with progressive dyspnea, nonproductive cough over the past 1 to 2 days and admitted for Acute on chronic hypoxic respiratory failure, suspect multifactorial etiology.  Patient was recently discharged (admission 9/8-9/10) for abdominal wall hematoma sustained after a fall while on anticoagulation for PE/AF.   OT comments  Pt progressing towards acute OT goals. Remains with deficits in balance and activity tolerance, generalized weakness. D/c plan remains appropriate.   Follow Up Recommendations  SNF;Supervision/Assistance - 24 hour    Equipment Recommendations  None recommended by OT    Recommendations for Other Services      Precautions / Restrictions Precautions Precautions: Fall Precaution Comments: O2 dependent, 2L at baseline Restrictions Weight Bearing Restrictions: No       Mobility Bed Mobility Overal bed mobility: Needs Assistance Bed Mobility: Supine to Sit     Supine to sit: Min guard     General bed mobility comments: for safety  Transfers Overall transfer level: Needs assistance Equipment used: Rolling walker (2 wheeled) Transfers: Sit to/from Stand Sit to Stand: Min guard         General transfer comment: min/guard for safety    Balance Overall balance assessment: Needs assistance Sitting-balance support: Feet supported Sitting balance-Leahy Scale: Good     Standing balance support: Bilateral upper extremity supported;During functional activity Standing balance-Leahy Scale: Fair                             ADL either performed or assessed  with clinical judgement   ADL                           Toilet Transfer: Min guard;Ambulation;BSC;RW(pivotal steps to [facility] due to urgency)           Functional mobility during ADLs: Min guard;Rolling walker       Vision       Perception     Praxis      Cognition Arousal/Alertness: Awake/alert Behavior During Therapy: WFL for tasks assessed/performed Overall Cognitive Status: Within Functional Limits for tasks assessed                                          Exercises     Shoulder Instructions       General Comments      Pertinent Vitals/ Pain       Pain Assessment: No/denies pain  Home Living                                          Prior Functioning/Environment              Frequency  Min 2X/week        Progress Toward Goals  OT Goals(current goals can now be found in the care plan section)  Progress towards OT goals: Progressing toward goals  Acute Rehab OT Goals Patient  Stated Goal: rehab then home OT Goal Formulation: With patient Time For Goal Achievement: 03/31/19 Potential to Achieve Goals: Good ADL Goals Pt Will Perform Grooming: with supervision;standing;with set-up;sitting Pt Will Perform Upper Body Bathing: with set-up;sitting Pt Will Perform Lower Body Bathing: with modified independence;sit to/from stand Pt Will Perform Upper Body Dressing: with set-up;sitting Pt Will Perform Lower Body Dressing: with modified independence;sit to/from stand Pt Will Transfer to Toilet: with modified independence;ambulating Pt Will Perform Toileting - Clothing Manipulation and hygiene: with modified independence;sit to/from stand Pt Will Perform Tub/Shower Transfer: with supervision;ambulating;3 in 1;rolling walker Additional ADL Goal #1: Pt will utilize 2 energy conservation techniques and verbalize 3 fall prevention techniques during ADL routine with independence to maximize safety and  independence. Additional ADL Goal #2: Pt will demonstrate increased activity tolerance by completing 3 consecutive grooming tasks standing without rest breaks.  Plan Discharge plan remains appropriate    Co-evaluation                 AM-PAC OT "6 Clicks" Daily Activity     Outcome Measure   Help from another person eating meals?: None Help from another person taking care of personal grooming?: A Little Help from another person toileting, which includes using toliet, bedpan, or urinal?: A Little Help from another person bathing (including washing, rinsing, drying)?: A Little Help from another person to put on and taking off regular upper body clothing?: None Help from another person to put on and taking off regular lower body clothing?: A Little 6 Click Score: 20    End of Session Equipment Utilized During Treatment: Rolling walker;Oxygen  OT Visit Diagnosis: Unsteadiness on feet (R26.81);Muscle weakness (generalized) (M62.81);History of falling (Z91.81);Pain   Activity Tolerance Patient tolerated treatment well   Patient Left Other (comment);with call bell/phone within reach(on [facility])   Nurse Communication Other (comment)(NT: pt on BSC at end of session, call bell in reach)        Time: [id number] OT Time Calculation (min): 10 min  Charges: OT General Charges $OT Visit: 1 Visit OT Treatments $Self Care/Home Management : 8-22 mins  Tyrone Schimke, OT Acute Rehabilitation Services Pager: [phone] Office: [phone]    Hortencia Pilar 03/21/2019, 1:36 PM

## 2019-03-21 NOTE — Progress Notes (Signed)
Physical Therapy Treatment Patient Details Name: [name] White MRN: [id number] DOB: [birth year] Today's Date: 03/21/2019    History of Present Illness 63 y.o. female with medical hitory significant for paroxysmal A. fib on eliquis, history of PE (2015), COPD, chronic respiratory failure on 2 L O2 at home, asthma, MDD, anxiety and reported OSA (n formal sleep study)who presents on 03/14/2019 with progressive dyspnea, nonproductive cough over the past 1 to 2 days and admitted for Acute on chronic hypoxic respiratory failure, suspect multifactorial etiology.  Patient was recently discharged (admission 9/8-9/10) for abdominal wall hematoma sustained after a fall while on anticoagulation for PE/AF.    PT Comments    Pt is motivated and progressing toward PT goals; pt will benefit from SNF to maximize independence  and prevent falls. Will continue to follow in acute setting.  Follow Up Recommendations  SNF     Equipment Recommendations  None recommended by PT    Recommendations for Other Services       Precautions / Restrictions Precautions Precautions: Fall Precaution Comments: O2 dependent, 2L at baseline Restrictions Weight Bearing Restrictions: No    Mobility  Bed Mobility Overal bed mobility: Needs Assistance Bed Mobility: Supine to Sit     Supine to sit: Min guard     General bed mobility comments: in chair on arrival  Transfers Overall transfer level: Needs assistance Equipment used: Rolling walker (2 wheeled) Transfers: Sit to/from Stand Sit to Stand: Min guard         General transfer comment: min/guard for safety  Ambulation/Gait Ambulation/Gait assistance: Min guard Gait Distance (Feet): 40 Feet(10') Assistive device: Rolling walker (2 wheeled) Gait Pattern/deviations: Step-through pattern;Wide base of support;Trunk flexed Gait velocity: reduced; encouraged trunk extension and pursed lip breathing for recovery, pt sats WNL but pt with incr WOB after amb   General Gait Details: cues for sequence and posture, breathing. pt fatigues quickly. SpO2= 97-98%, HR in 80s   Stairs             Wheelchair Mobility    Modified Rankin (Stroke Patients Only)       Balance Overall balance assessment: Needs assistance Sitting-balance support: Feet supported Sitting balance-Leahy Scale: Good     Standing balance support: Bilateral upper extremity supported;During functional activity Standing balance-Leahy Scale: Fair Standing balance comment: able to stand statically without UE support briefly, fatigues qucikly, reliant on UEs for dynamic tasks                            Cognition Arousal/Alertness: Awake/alert Behavior During Therapy: WFL for tasks assessed/performed Overall Cognitive Status: Within Functional Limits for tasks assessed                                        Exercises General Exercises - Lower Extremity Long Arc Quad: AROM;Both;10 reps Toe Raises: AROM;Both;10 reps;Seated Heel Raises: AROM;Both;10 reps;Seated    General Comments        Pertinent Vitals/Pain Pain Assessment: No/denies pain    Home Living                      Prior Function            PT Goals (current goals can now be found in the care plan section) Acute Rehab PT Goals Patient Stated Goal: rehab then home PT Goal Formulation: With patient  Time For Goal Achievement: 03/31/19 Potential to Achieve Goals: Good Progress towards PT goals: Progressing toward goals    Frequency    Min 2X/week      PT Plan Current plan remains appropriate    Co-evaluation              AM-PAC PT "6 Clicks" Mobility   Outcome Measure  Help needed turning from your back to your side while in a flat bed without using bedrails?: A Little Help needed moving from lying on your back to sitting on the side of a flat bed without using bedrails?: A Little Help needed moving to and from a bed to a chair (including a  wheelchair)?: A Little Help needed standing up from a chair using your arms (e.g., wheelchair or bedside chair)?: A Little Help needed to walk in hospital room?: A Little Help needed climbing 3-5 steps with a railing? : A Lot 6 Click Score: 17    End of Session Equipment Utilized During Treatment: Oxygen Activity Tolerance: Patient tolerated treatment well Patient left: in chair;with call bell/phone within reach Nurse Communication: Mobility status PT Visit Diagnosis: Muscle weakness (generalized) (M62.81);Difficulty in walking, not elsewhere classified (R26.2);History of falling (Z91.81)     Time: 1440-1500 PT Time Calculation (min) (ACUTE ONLY): 20 min  Charges:                        Kenyon Ana, PT  Pager: [phone] Acute Rehab Dept [facility]): [phone]   03/21/2019    [facility] 03/21/2019, 3:18 PM

## 2019-03-22 ENCOUNTER — Other Ambulatory Visit: Payer: Self-pay | Admitting: *Deleted

## 2019-03-22 DIAGNOSIS — J441 Chronic obstructive pulmonary disease with (acute) exacerbation: Secondary | ICD-10-CM | POA: Diagnosis not present

## 2019-03-22 DIAGNOSIS — Z7401 Bed confinement status: Secondary | ICD-10-CM | POA: Diagnosis not present

## 2019-03-22 DIAGNOSIS — Z7901 Long term (current) use of anticoagulants: Secondary | ICD-10-CM | POA: Diagnosis not present

## 2019-03-22 DIAGNOSIS — Z6841 Body Mass Index (BMI) 40.0 and over, adult: Secondary | ICD-10-CM | POA: Diagnosis not present

## 2019-03-22 DIAGNOSIS — J9600 Acute respiratory failure, unspecified whether with hypoxia or hypercapnia: Secondary | ICD-10-CM | POA: Diagnosis not present

## 2019-03-22 DIAGNOSIS — M170 Bilateral primary osteoarthritis of knee: Secondary | ICD-10-CM | POA: Diagnosis not present

## 2019-03-22 DIAGNOSIS — R0602 Shortness of breath: Secondary | ICD-10-CM | POA: Diagnosis not present

## 2019-03-22 DIAGNOSIS — I482 Chronic atrial fibrillation, unspecified: Secondary | ICD-10-CM | POA: Diagnosis not present

## 2019-03-22 DIAGNOSIS — I10 Essential (primary) hypertension: Secondary | ICD-10-CM | POA: Diagnosis not present

## 2019-03-22 DIAGNOSIS — J9621 Acute and chronic respiratory failure with hypoxia: Secondary | ICD-10-CM | POA: Diagnosis not present

## 2019-03-22 DIAGNOSIS — I509 Heart failure, unspecified: Secondary | ICD-10-CM | POA: Diagnosis not present

## 2019-03-22 DIAGNOSIS — F064 Anxiety disorder due to known physiological condition: Secondary | ICD-10-CM | POA: Diagnosis not present

## 2019-03-22 DIAGNOSIS — F419 Anxiety disorder, unspecified: Secondary | ICD-10-CM | POA: Diagnosis not present

## 2019-03-22 DIAGNOSIS — I4892 Unspecified atrial flutter: Secondary | ICD-10-CM | POA: Diagnosis not present

## 2019-03-22 DIAGNOSIS — F331 Major depressive disorder, recurrent, moderate: Secondary | ICD-10-CM | POA: Diagnosis not present

## 2019-03-22 DIAGNOSIS — R062 Wheezing: Secondary | ICD-10-CM | POA: Diagnosis not present

## 2019-03-22 DIAGNOSIS — J4521 Mild intermittent asthma with (acute) exacerbation: Secondary | ICD-10-CM | POA: Diagnosis not present

## 2019-03-22 DIAGNOSIS — R0600 Dyspnea, unspecified: Secondary | ICD-10-CM | POA: Diagnosis not present

## 2019-03-22 DIAGNOSIS — I5032 Chronic diastolic (congestive) heart failure: Secondary | ICD-10-CM | POA: Diagnosis not present

## 2019-03-22 DIAGNOSIS — I5033 Acute on chronic diastolic (congestive) heart failure: Secondary | ICD-10-CM | POA: Diagnosis not present

## 2019-03-22 DIAGNOSIS — R031 Nonspecific low blood-pressure reading: Secondary | ICD-10-CM | POA: Diagnosis not present

## 2019-03-22 DIAGNOSIS — R5381 Other malaise: Secondary | ICD-10-CM | POA: Diagnosis not present

## 2019-03-22 DIAGNOSIS — T148XXA Other injury of unspecified body region, initial encounter: Secondary | ICD-10-CM | POA: Diagnosis not present

## 2019-03-22 DIAGNOSIS — J9611 Chronic respiratory failure with hypoxia: Secondary | ICD-10-CM | POA: Diagnosis not present

## 2019-03-22 DIAGNOSIS — J383 Other diseases of vocal cords: Secondary | ICD-10-CM | POA: Diagnosis not present

## 2019-03-22 DIAGNOSIS — Z23 Encounter for immunization: Secondary | ICD-10-CM | POA: Diagnosis not present

## 2019-03-22 DIAGNOSIS — R739 Hyperglycemia, unspecified: Secondary | ICD-10-CM

## 2019-03-22 DIAGNOSIS — R0989 Other specified symptoms and signs involving the circulatory and respiratory systems: Secondary | ICD-10-CM | POA: Diagnosis not present

## 2019-03-22 DIAGNOSIS — G2589 Other specified extrapyramidal and movement disorders: Secondary | ICD-10-CM | POA: Diagnosis not present

## 2019-03-22 DIAGNOSIS — M6281 Muscle weakness (generalized): Secondary | ICD-10-CM | POA: Diagnosis not present

## 2019-03-22 DIAGNOSIS — I480 Paroxysmal atrial fibrillation: Secondary | ICD-10-CM | POA: Diagnosis not present

## 2019-03-22 DIAGNOSIS — J9811 Atelectasis: Secondary | ICD-10-CM | POA: Diagnosis not present

## 2019-03-22 DIAGNOSIS — J189 Pneumonia, unspecified organism: Secondary | ICD-10-CM | POA: Diagnosis not present

## 2019-03-22 DIAGNOSIS — M2550 Pain in unspecified joint: Secondary | ICD-10-CM | POA: Diagnosis not present

## 2019-03-22 DIAGNOSIS — G4700 Insomnia, unspecified: Secondary | ICD-10-CM | POA: Diagnosis not present

## 2019-03-22 DIAGNOSIS — G4730 Sleep apnea, unspecified: Secondary | ICD-10-CM | POA: Diagnosis not present

## 2019-03-22 DIAGNOSIS — R600 Localized edema: Secondary | ICD-10-CM | POA: Diagnosis not present

## 2019-03-22 DIAGNOSIS — J40 Bronchitis, not specified as acute or chronic: Secondary | ICD-10-CM | POA: Diagnosis not present

## 2019-03-22 DIAGNOSIS — R05 Cough: Secondary | ICD-10-CM | POA: Diagnosis not present

## 2019-03-22 DIAGNOSIS — J449 Chronic obstructive pulmonary disease, unspecified: Secondary | ICD-10-CM | POA: Diagnosis not present

## 2019-03-22 DIAGNOSIS — J4541 Moderate persistent asthma with (acute) exacerbation: Secondary | ICD-10-CM | POA: Diagnosis not present

## 2019-03-22 DIAGNOSIS — F411 Generalized anxiety disorder: Secondary | ICD-10-CM | POA: Diagnosis not present

## 2019-03-22 LAB — GLUCOSE, CAPILLARY
Glucose-Capillary: 130 mg/dL — ABNORMAL HIGH (ref 70–99)
Glucose-Capillary: 119 mg/dL — ABNORMAL HIGH (ref 70–99)

## 2019-03-22 LAB — BASIC METABOLIC PANEL
Anion gap: 6 (ref 5–15)
BUN: 27 mg/dL — ABNORMAL HIGH (ref 8–23)
CO2: 35 mmol/L — ABNORMAL HIGH (ref 22–32)
Calcium: 8.1 mg/dL — ABNORMAL LOW (ref 8.9–10.3)
Chloride: 98 mmol/L (ref 98–111)
Creatinine, Ser: 0.55 mg/dL (ref 0.44–1.00)
GFR calc Af Amer: >60 mL/min (ref >60)
GFR calc non Af Amer: >60 mL/min (ref >60)
Glucose, Bld: 167 mg/dL — ABNORMAL HIGH (ref 70–99)
Potassium: 3.4 mmol/L — ABNORMAL LOW (ref 3.5–5.1)
Sodium: 139 mmol/L (ref 135–145)

## 2019-03-22 MED ORDER — FUROSEMIDE 40 MG PO TABS: 40.0000 mg | ORAL_TABLET | Freq: Every day | ORAL | 0 refills | Status: DC

## 2019-03-22 MED ORDER — POTASSIUM CHLORIDE CRYS ER 20 MEQ PO TBCR
20.0000 meq | EXTENDED_RELEASE_TABLET | Freq: Every day | ORAL | Status: DC
  Administered 2019-03-22: 20 meq via ORAL
  Filled 2019-03-22: qty 1

## 2019-03-22 MED ORDER — POTASSIUM CHLORIDE CRYS ER 20 MEQ PO TBCR: 20.0000 meq | EXTENDED_RELEASE_TABLET | Freq: Every day | ORAL | 0 refills | Status: DC

## 2019-03-22 NOTE — Patient Outreach (Signed)
[place] [facility]) Care Management  03/22/2019  [name] White [birth year] [id number]   Patient continues to reside at [facility], awaiting medical clearance before being transferred to [facility] [place], to receive short-term rehabilitative services.  Once discharged from [facility], patient has agreed to go into a long-term care assisted living facility.  CSW will work with the Veterinary surgeon at [facility] to assist with these arrangements.  CSW will continue to follow patient while hospitalized and then resume social work/community case management services, once patient is settled in at [facility].  [name], BSW, MSW, LCSW  Licensed Education officer, environmental [facility]  Mailing [place] [address] [place], [place] [address] Physical Address-[address], [place], [name] [address] Toll Free Main # [phone] Fax # [phone] Cell # [phone]  Office # [phone] [name]

## 2019-03-22 NOTE — TOC Progression Note (Signed)
Transition of Care [facility]) - Progression Note    Patient Details  Name: IMBERLY WAAGE MRN: [id number] Date of Birth: 07/06/1955  Transition of [facility]) CM/SW Contact  Purcell Mouton, RN Phone Number: 03/22/2019, 11:36 AM  Clinical Narrative:    Transportation PTAR called for 1300, RN is aware.    Expected Discharge Plan: [place]    Expected Discharge Plan and Services Expected Discharge Plan: [place]   Discharge Planning Services: CM Consult Post Acute Care Choice: [place] arrangements for the past 2 months: Single Family Home Expected Discharge Date: 03/22/19                         HH Arranged: RN, PT, OT, Nurse's Aide [place] Agency: [place] ([place]) Date [place]: 03/15/19 Time Outlook: 1041 Representative spoke with at [place]: Santiago Glad, RN   Social Determinants of Health ([place]) Interventions    Readmission Risk Interventions Readmission Risk Prevention Plan 03/08/2019  Transportation Screening Complete  Medication Review Press photographer) Complete  PCP or Specialist appointment within 3-5 days of discharge Complete  HRI or [place] Complete  SW Recovery Care/Counseling Consult Complete  [place] Not Applicable  Some recent data might be hidden

## 2019-03-22 NOTE — Consult Note (Signed)
   [facility] Inpatient Consult   03/22/2019  [name] White 07/24/1955 [id number]   [facility] Follow up:  Per chart review, current disposition plan is for SNF. [facility] CM social worker note indicates patient will continued to be followed at [facility] to assess for community needs. No referral will be placed for [facility] CM community RN engagement at this time.  Netta Cedars, MSN, [facility] Liaison Nurse Mobile Phone [phone]  Toll free office [phone]

## 2019-03-22 NOTE — Discharge Summary (Addendum)
Physician Discharge Summary  [name] White [id number] DOB: 1955/11/03 DOA: 03/14/2019  PCP: [name], MD  Admit date: 03/14/2019 Discharge date: 03/22/2019  Admitted From: Home  Disposition:  SNF   Recommendations for Outpatient Follow-up and new medication changes:  1. Follow up with Dr. Kris Mouton in 7 days.  2. Patient has been placed on 40 mg daily of furosemide along with K supplements.  3. Repeat SARS-CoV-2 19 September 23- negative.  4. Follow renal panel in 7 days.   Home Health: na   Equipment/Devices: na    Discharge Condition: stable  CODE STATUS: dnr   Diet recommendation: heart health and diabetic prudent.   Brief/Interim Summary: 63 year old female who presented with dyspnea. She does have significant past medical history for paroxysmal atrial fibrillation, pulmonary embolism, COPD, chronic hypoxic respiratory failure, asthma, anxiety and obstructive sleep apnea. Patient reported progressive dyspnea for about 24 to 48 hours, associated with chest tightness, refractive to use of bronchodilators at home. Her oximetry saturation at home was 80%. She has stopped her anticoagulation due to abdominal wall hematoma and concerns of gastrointestinal bleeding. On her initial physical examination her respiratory rate was 22, oxygen saturation down to 67% up to 97% on 3, LPM per [place], blood pressure 134/68, heart rate 88.Her lungs had diffuse wheezing bilaterally, heart S1-S2 present and rhythmic, abdomen soft, no lower extremity edema, positive abdominal wall hematoma. Sodium 140, potassium 3.2, chloride 103, bicarb 26, glucose 142, BUN 16, creatinine 0.57, white count 11.2, hemoglobin 10.5, hematocrit 36.4, platelets 524.  SARS COVID-19 negative.  Chest CT negative for pulmonary embolism, faint groundglass opacities bilaterally. Her chest x-ray had left rotation, bilateral interstitial infiltrates, right midlung atelectasis. EKG 81 bpm, normal axis, normal intervals,  sinus rhythm with PACs, no ST segment or T wave changes.  Patient was admitted to the hospital with a working diagnosis of acute on chronic hypoxic respiratory failure due to acute on chronic diastolic heart failure.  1.  Acute on chronic hypoxic respiratory failure due to diastolic heart failure exacerbation/March 2020 preserved LV systolic function.  Patient was admitted to the telemetry ward, she received aggressive diuresis with IV furosemide, negative fluid balance was achieved 11,331 since admission with significant improvement of her symptoms.  Patient will be discharged on 40 mg daily of furosemide.  Continue fluid and sodium restriction.  Her oximetry saturation at discharge 99% on 2 L per nasal cannula.  Continue rate control heart failure management with metoprolol.  2.  Acute COPD exacerbation.  Patient received bronchodilator therapy with good response.  She received a short course of systemic steroids and doxycycline. Continue bronchodilator therapy and inhaled corticosteroids at discharge.   3.  Atrial fibrillation.  Continue rate control with diltiazem, metoprolol, rate control with flecainide, resume anticoagulation with apixaban.  4.  History of pulmonary embolism.  Repeat CT chest with no proximal pulmonary embolism, will continue anticoagulation with apixaban.  5.  Ambulatory dysfunction, abdominal wall hematoma.  Patient was seen by physical therapy with recommendations to continue therapy at a skilled nursing facility.  6.  Depression.  Continue Effexor.  7.  Morbid obesity with obstructive sleep apnea.  Patient's calculated BMI is 54.  Patient will need outpatient sleep study.  8.  Steroid-induced hyperglycemia.  Patient received insulin sliding scale while hospitalized and steroids were rapidly tapered off.  9.  Hypertension.  Continue blood pressure control with diltiazem and metoprolol.  Discharge Diagnoses:  Active Problems:   Morbid obesity (HCC)   HYPERTENSION,  BENIGN SYSTEMIC  Atrial flutter (HCC)   Hematoma   Chronic anticoagulation   History of hypertension   Hypokalemia   Chronic respiratory failure with hypoxia (HCC)   COPD with acute exacerbation (HCC)   Heart failure with preserved ejection fraction (HCC)   Obesity hypoventilation syndrome ([place])   History of pulmonary embolus (PE)   Acute on chronic respiratory failure with hypoxemia (HCC)   Acute on chronic heart failure with preserved ejection fraction ([place])   Hyperglycemia    Discharge Instructions   Allergies as of 03/22/2019      Reactions   Caffeine Other (See Comments)   Migraine   Hydralazine Hcl    Other reaction(s): Other (See Comments) AKI leading to rhabdo and electrolyte abnormalities   Hydrocodone Nausea And Vomiting   Headache   Ciprofloxacin Hives, Rash   Erythromycin Hives, Rash   Lisinopril Cough   Oxycodone Nausea And Vomiting   Headache   Sulfamethoxazole-trimethoprim Rash      Medication List    STOP taking these medications   MAGnesium-Oxide 400 (241.3 Mg) MG tablet Generic drug: magnesium oxide   pantoprazole 40 MG tablet Commonly known as: PROTONIX     TAKE these medications   acetaminophen 325 MG tablet Commonly known as: TYLENOL Take 2 tablets (650 mg total) by mouth every 6 (six) hours as needed for mild pain.   albuterol 108 (90 Base) MCG/ACT inhaler Commonly known as: VENTOLIN HFA Inhale 1-2 puffs into the lungs every 6 (six) hours as needed for wheezing or shortness of breath.   albuterol (2.5 MG/3ML) 0.083% nebulizer solution Commonly known as: PROVENTIL Take 3 mLs (2.5 mg total) by nebulization every 6 (six) hours as needed for wheezing or shortness of breath.   apixaban 5 MG Tabs tablet Commonly known as: ELIQUIS Take 1 tablet (5 mg total) by mouth 2 (two) times daily.   azelastine 0.05 % ophthalmic solution Commonly known as: OPTIVAR Place 1 drop into both eyes 2 (two) times daily as needed (itchy eyes).    cyclobenzaprine 10 MG tablet Commonly known as: FLEXERIL TAKE 1 TABLET BY MOUTH EVERY 8 HOURS AS NEEDED FOR PAIN   cycloSPORINE 0.05 % ophthalmic emulsion Commonly known as: RESTASIS Place 1 drop into both eyes 2 (two) times daily.   diclofenac sodium 1 % Gel Commonly known as: VOLTAREN APPLY 2 GRAMS EXTERNALLY TO THE AFFECTED AREA FOUR TIMES DAILY What changed: See the new instructions.   diltiazem 120 MG 24 hr capsule Commonly known as: CARDIZEM CD Take 1 capsule (120 mg total) by mouth daily. Please keep upcoming appt in January before anymore refills. Final Attempt What changed: when to take this   famotidine 20 MG tablet Commonly known as: PEPCID Take 1 tablet (20 mg total) by mouth daily.   flecainide 100 MG tablet Commonly known as: TAMBOCOR TAKE 1 TABLET BY MOUTH 2 TIMES A DAY. PLEASE KEEP UPCOMING APPOINTMENT IN JANUARY BEFORE ANYMORE REFILLS. What changed: See the new instructions.   fluticasone 50 MCG/ACT nasal spray Commonly known as: FLONASE Place 2 sprays into both nostrils daily.   furosemide 40 MG tablet Commonly known as: LASIX Take 1 tablet (40 mg total) by mouth daily.   hydrocortisone 25 MG suppository Commonly known as: ANUSOL-HC Place 1 suppository (25 mg total) rectally 2 (two) times daily.   hydrOXYzine 25 MG capsule Commonly known as: VISTARIL Take 25 mg by mouth 2 (two) times daily as needed for anxiety.   loratadine 10 MG tablet Commonly known as: CLARITIN TAKE 1 TABLET(10  MG) BY MOUTH DAILY What changed: See the new instructions.   metoprolol tartrate 25 MG tablet Commonly known as: LOPRESSOR TAKE 1 TABLET BY MOUTH TWICE DAILY What changed: how much to take   OXYGEN 2L at bedtime and prn exertion   potassium chloride SA 20 MEQ tablet Commonly known as: K-DUR Take 1 tablet (20 mEq total) by mouth daily.   umeclidinium bromide 62.5 MCG/INH Aepb Commonly known as: INCRUSE ELLIPTA Inhale 1 puff into the lungs daily.    venlafaxine XR 150 MG 24 hr capsule Commonly known as: EFFEXOR-XR Take 187.5 mg by mouth daily with breakfast. Take 150mg  capsule with 37.5mg  tablet   venlafaxine 37.5 MG tablet Commonly known as: EFFEXOR Take 187.5 mg by mouth daily. Take 37.5mg  tablet with 150mg       Contact information for after-discharge care    Destination    [facility] .   Service: Skilled Nursing Contact information: [address] [id number] [phone]             Allergies  Allergen Reactions  . Caffeine Other (See Comments)    Migraine  . Hydralazine Hcl     Other reaction(s): Other (See Comments) AKI leading to rhabdo and electrolyte abnormalities  . Hydrocodone Nausea And Vomiting    Headache  . Ciprofloxacin Hives and Rash  . Erythromycin Hives and Rash  . Lisinopril Cough  . Oxycodone Nausea And Vomiting    Headache  . Sulfamethoxazole-Trimethoprim Rash    Consultations:     Procedures/Studies: Dg Chest 2 View  Result Date: 02/27/2019 CLINICAL DATA:  Cough, shortness of breath. EXAM: CHEST - 2 VIEW COMPARISON:  Radiograph of February 26, 2019. FINDINGS: Stable cardiomegaly. No pneumothorax or pleural effusion is noted. No acute pulmonary disease is noted. Bony thorax is unremarkable. IMPRESSION: No active cardiopulmonary disease. Electronically Signed   By: Marijo Conception M.D.   On: 02/27/2019 12:36   Dg Shoulder Right  Result Date: 02/26/2019 CLINICAL DATA:  Right shoulder pain after fall. EXAM: RIGHT SHOULDER - 2+ VIEW COMPARISON:  Radiographs of October 24, 2010. FINDINGS: There is no evidence of fracture or dislocation. Mild degenerative changes seen involving the right acromioclavicular and glenohumeral joints. Soft tissues are unremarkable. IMPRESSION: Mild degenerative joint disease involving the right acromioclavicular and glenohumeral joints. No acute abnormality seen in the right shoulder. Electronically Signed   By:  Marijo Conception M.D.   On: 02/26/2019 09:40   Ct Head Wo Contrast  Result Date: 02/26/2019 CLINICAL DATA:  Head trauma, minor, fall with head strike, on anticoagulation. EXAM: CT HEAD WITHOUT CONTRAST TECHNIQUE: Contiguous axial images were obtained from the base of the skull through the vertex without intravenous contrast. COMPARISON:  CT head 12/31/2007 FINDINGS: Brain: No evidence of acute infarction, hemorrhage, hydrocephalus, extra-axial collection or mass lesion/mass effect. Vascular: No hyperdense vessel or unexpected calcification. Skull: No calvarial fracture or suspicious osseous lesion. No scalp swelling or hematoma. Sinuses/Orbits: Paranasal sinuses and mastoid air cells are predominantly clear. Pneumatization of the petrous apices. Included orbital structures are unremarkable. Debris within the right external auditory canal. Middle ear cavities are clear. Other: None. IMPRESSION: No acute intracranial abnormality. Electronically Signed   By: Lovena Le M.D.   On: 02/26/2019 00:25   Ct Angio Chest Pe W Or Wo Contrast  Result Date: 03/14/2019 CLINICAL DATA:  Shortness of breath. EXAM: CT ANGIOGRAPHY CHEST WITH CONTRAST TECHNIQUE: Multidetector CT imaging of the chest was performed using the standard protocol during  bolus administration of intravenous contrast. Multiplanar CT image reconstructions and MIPs were obtained to evaluate the vascular anatomy. CONTRAST:  119mL OMNIPAQUE IOHEXOL 350 MG/ML SOLN COMPARISON:  CT scan of December 26, 2018. FINDINGS: Cardiovascular: There is no evidence of large central pulmonary embolus. However, due to persistent respiratory motion artifact, smaller emboli cannot be excluded in the peripheral aspects of the pulmonary arteries. There is no evidence of thoracic aortic dissection or aneurysm. Mild cardiomegaly is noted. No pericardial effusion is noted. Mediastinum/Nodes: Status post right thyroidectomy. No adenopathy is noted. Esophagus is unremarkable.  Lungs/Pleura: No pneumothorax or pleural effusion is noted. Minimal bilateral posterior basilar subsegmental atelectasis is noted. Upper Abdomen: No acute abnormality. Musculoskeletal: Probable soft tissue contusion seen in the subcutaneous tissues of the epigastric region. No acute or significant osseous findings. Review of the MIP images confirms the above findings. IMPRESSION: No definite evidence of large central pulmonary embolus seen in the main pulmonary artery or main portions of the right and left pulmonary arteries. However, due to persistent respiratory motion artifact, smaller and more peripheral pulmonary emboli cannot be excluded on the basis of this exam. Probable soft tissue contusions seen in the subcutaneous tissues of the epigastric region of the abdomen. Electronically Signed   By: Marijo Conception M.D.   On: 03/14/2019 16:43   Ct Abdomen Pelvis W Contrast  Result Date: 03/06/2019 CLINICAL DATA:  Abdominal trauma, blunt EXAM: CT ABDOMEN AND PELVIS WITH CONTRAST TECHNIQUE: Multidetector CT imaging of the abdomen and pelvis was performed using the standard protocol following bolus administration of intravenous contrast. CONTRAST:  113mL OMNIPAQUE IOHEXOL 300 MG/ML  SOLN COMPARISON:  None. FINDINGS: Lower chest: The visualized heart size within normal limits. No pericardial fluid/thickening. No hiatal hernia. The visualized portions of the lungs are clear. Hepatobiliary: There is a 1 cm tiny hypodense lesion seen in the anterior left liver lobe.The main portal vein is patent. No evidence of calcified gallstones, gallbladder wall thickening or biliary dilatation. Pancreas: Unremarkable. No pancreatic ductal dilatation or surrounding inflammatory changes. Spleen: Normal in size without focal abnormality. Adrenals/Urinary Tract: Both adrenal glands appear normal. The kidneys and collecting system appear normal without evidence of urinary tract calculus or hydronephrosis. Bladder is unremarkable.  Stomach/Bowel: The stomach, small bowel, are normal in appearance. Scattered colonic diverticula are noted. No inflammatory changes, wall thickening, or obstructive findings. Vascular/Lymphatic: There are no enlarged mesenteric, retroperitoneal, or pelvic lymph nodes. No significant vascular findings are present. Reproductive: The patient is status post hysterectomy. No adnexal masses or collections seen. Other: Along the anterior abdominal wall there is diffuse subcutaneous stranding with subcutaneous hematomas seen anteriorly the largest measuring 2.1 cm. There is also subcutaneous stranding changes seen the right and left lateral abdominal wall of well as the posterior right back. Musculoskeletal: No acute or significant osseous findings. Partially visualized fat containing mass seen within the left adductor musculature. IMPRESSION: Anterior abdominal wall contusions with small subcutaneous hematoma. No acute intra-abdominal or pelvic injury. Electronically Signed   By: Prudencio Pair M.D.   On: 03/06/2019 01:39   Dg Chest Port 1 View  Result Date: 03/17/2019 CLINICAL DATA:  Re-evaluation for pneumonia EXAM: PORTABLE CHEST 1 VIEW COMPARISON:  03/14/2019 FINDINGS: Mild bilateral interstitial prominence. No pleural effusion or pneumothorax. No focal consolidation. Right perihilar atelectasis. Stable cardiomegaly. No acute osseous abnormality. IMPRESSION: No acute cardiopulmonary disease. Stable cardiomegaly. Electronically Signed   By: Kathreen Devoid   On: 03/17/2019 12:11   Dg Chest Portable 1 View  Result Date: 03/14/2019  CLINICAL DATA:  Shortness of breath. EXAM: PORTABLE CHEST 1 VIEW COMPARISON:  Radiographs of February 27, 2019. FINDINGS: Stable cardiomegaly. Atherosclerosis of thoracic aorta is noted. No pneumothorax or pleural effusion is noted. Probable central pulmonary vascular congestion is noted. Mild right midlung subsegmental atelectasis or scarring is noted. Mild left lingular subsegmental  atelectasis or scarring is noted. Bony thorax is unremarkable. Metallic density is seen projected over the superior mediastinum which may represent overlying artifact. IMPRESSION: Stable cardiomegaly with probable central pulmonary vascular congestion. Mild bilateral subsegmental atelectasis or scarring is noted. Aortic Atherosclerosis (ICD10-I70.0). Electronically Signed   By: Marijo Conception M.D.   On: 03/14/2019 09:27   Dg Chest Portable 1 View  Result Date: 02/26/2019 CLINICAL DATA:  Wheezing. Additional history: Chest tightness morning, fall at home last night. EXAM: PORTABLE CHEST 1 VIEW COMPARISON:  CT chest 12/26/2018, chest radiographs 12/26/2018 FINDINGS: Stable cardiomediastinal silhouette. Aortic atherosclerosis. Shallow inspiration radiograph. Mild left retrocardiac opacity may reflect atelectasis. No evidence of pleural effusion or pneumothorax. No acute bony abnormality. IMPRESSION: Shallow inspiration radiograph. Mild left retrocardiac opacity may reflect atelectasis. Pneumonia is difficult to exclude. Electronically Signed   By: Kellie Simmering   On: 02/26/2019 08:07   Dg Knee Complete 4 Views Right  Result Date: 02/26/2019 CLINICAL DATA:  Fall, pain, injury. Suprapatellar swelling. EXAM: RIGHT KNEE - COMPLETE 4+ VIEW COMPARISON:  None. FINDINGS: Mild prepatellar soft tissue swelling. No joint effusion. No acute fracture or traumatic malalignment. Moderate tricompartmental degenerative changes are present most pronounced in the patellofemoral and medial femorotibial compartments. Enthesopathic changes are noted at the patellar insertion of the distal quadriceps tendon. Additional spurring noted along the medial femoral condyle. Remaining soft tissues are unremarkable. IMPRESSION: Mild prepatellar swelling. No effusion or acute osseous abnormality. Moderate tricompartmental degenerative changes most pronounced in the medial and patellofemoral compartments. Electronically Signed   By: Lovena Le  M.D.   On: 02/26/2019 00:28   Dg Hip Unilat W Or Wo Pelvis 2-3 Views Right  Result Date: 02/26/2019 CLINICAL DATA:  Right hip pain after fall. EXAM: DG HIP (WITH OR WITHOUT PELVIS) 2-3V RIGHT COMPARISON:  None. FINDINGS: There is no evidence of hip fracture or dislocation. There is no evidence of arthropathy or other focal bone abnormality. IMPRESSION: Negative. Electronically Signed   By: Marijo Conception M.D.   On: 02/26/2019 09:41      Procedures:   Subjective: Patient is feeling better, dyspnea continue to improve, no nausea or vomiting, continue to be very weak and deconditioned.   Discharge Exam: Vitals:   03/22/19 0621 03/22/19 0747  BP: 130/72   Pulse: 62   Resp: 18   Temp: 98.4 F (36.9 C)   SpO2: 95% 99%   Vitals:   03/21/19 2049 03/21/19 2257 03/22/19 0621 03/22/19 0747  BP: 135/78 130/72 130/72   Pulse: 70 72 62   Resp: 18  18   Temp: 98.7 F (37.1 C)  98.4 F (36.9 C)   TempSrc: Oral  Oral   SpO2: 97%  95% 99%  Weight:   (!) 161.2 kg   Height:        General: Not in pain or dyspnea Neurology: Awake and alert, non focal  E ENT: mild pallor, no icterus, oral mucosa moist Cardiovascular: No JVD. S1-S2 present, rhythmic, no gallops, rubs, or murmurs. Non pitting lower extremity edema. Pulmonary: positive breath sounds bilaterally, adequate air movement, no wheezing, rhonchi or rales. Gastrointestinal. Abdomen protuberant with no organomegaly, non tender, no rebound or  guarding Skin. No rashes Musculoskeletal: no joint deformities   The results of significant diagnostics from this hospitalization (including imaging, microbiology, ancillary and laboratory) are listed below for reference.     Microbiology: Recent Results (from the past 240 hour(s))  SARS Coronavirus 2 [facility] order, Performed in [facility] hospital lab) Nasopharyngeal Nasopharyngeal Swab     Status: None   Collection Time: 03/14/19  7:35 AM   Specimen: Nasopharyngeal Swab  Result Value  Ref Range Status   SARS Coronavirus 2 NEGATIVE NEGATIVE Final    Comment: (NOTE) If result is NEGATIVE SARS-CoV-2 target nucleic acids are NOT DETECTED. The SARS-CoV-2 RNA is generally detectable in upper and lower  respiratory specimens during the acute phase of infection. The lowest  concentration of SARS-CoV-2 viral copies this assay can detect is 250  copies / mL. A negative result does not preclude SARS-CoV-2 infection  and should not be used as the sole basis for treatment or other  patient management decisions.  A negative result may occur with  improper specimen collection / handling, submission of specimen other  than nasopharyngeal swab, presence of viral mutation(s) within the  areas targeted by this assay, and inadequate number of viral copies  (<250 copies / mL). A negative result must be combined with clinical  observations, patient history, and epidemiological information. If result is POSITIVE SARS-CoV-2 target nucleic acids are DETECTED. The SARS-CoV-2 RNA is generally detectable in upper and lower  respiratory specimens dur ing the acute phase of infection.  Positive  results are indicative of active infection with SARS-CoV-2.  Clinical  correlation with patient history and other diagnostic information is  necessary to determine patient infection status.  Positive results do  not rule out bacterial infection or co-infection with other viruses. If result is PRESUMPTIVE POSTIVE SARS-CoV-2 nucleic acids MAY BE PRESENT.   A presumptive positive result was obtained on the submitted specimen  and confirmed on repeat testing.  While 2019 novel coronavirus  (SARS-CoV-2) nucleic acids may be present in the submitted sample  additional confirmatory testing may be necessary for epidemiological  and / or clinical management purposes  to differentiate between  SARS-CoV-2 and other Sarbecovirus currently known to infect humans.  If clinically indicated additional testing with an  alternate test  methodology [phone]) is advised. The SARS-CoV-2 RNA is generally  detectable in upper and lower respiratory sp ecimens during the acute  phase of infection. The expected result is Negative. Fact Sheet for Patients:  StrictlyIdeas.no Fact Sheet for Healthcare Providers: BankingDealers.co.za This test is not yet approved or cleared by the [place] FDA and has been authorized for detection and/or diagnosis of SARS-CoV-2 by FDA under an Emergency Use Authorization (EUA).  This EUA will remain in effect (meaning this test can be used) for the duration of the COVID-19 declaration under Section 564(b)(1) of the Act, 21 U.S.C. section 360bbb-3(b)(1), unless the authorization is terminated or revoked sooner. Performed at [facility], [place] [address], [place], [place] [address]   SARS CORONAVIRUS 2 (TAT 6-24 HRS) Nasopharyngeal Nasopharyngeal Swab     Status: None   Collection Time: 03/20/19  8:24 AM   Specimen: Nasopharyngeal Swab  Result Value Ref Range Status   SARS Coronavirus 2 NEGATIVE NEGATIVE Final    Comment: (NOTE) SARS-CoV-2 target nucleic acids are NOT DETECTED. The SARS-CoV-2 RNA is generally detectable in upper and lower respiratory specimens during the acute phase of infection. Negative results do not preclude SARS-CoV-2 infection, do not rule out co-infections  with other pathogens, and should not be used as the sole basis for treatment or other patient management decisions. Negative results must be combined with clinical observations, patient history, and epidemiological information. The expected result is Negative. Fact Sheet for Patients: SugarRoll.be Fact Sheet for Healthcare Providers: https://www.woods-mathews.com/ This test is not yet approved or cleared by the [place] FDA and  has been authorized for detection and/or diagnosis of  SARS-CoV-2 by FDA under an Emergency Use Authorization (EUA). This EUA will remain  in effect (meaning this test can be used) for the duration of the COVID-19 declaration under Section 56 4(b)(1) of the Act, 21 U.S.C. section 360bbb-3(b)(1), unless the authorization is terminated or revoked sooner. Performed at [facility], [place] [address] [place], [place] [address]      Labs: BNP (last 3 results) Recent Labs    12/20/18 1340 02/26/19 0856 03/14/19 0750  BNP 27.8 34.5 [id number]   Basic Metabolic Panel: Recent Labs  Lab 03/16/19 0339 03/17/19 0423 03/19/19 0417 03/21/19 0502 03/22/19 0439  NA 140 140 139 139 139  K 4.0 3.8 3.3* 3.3* 3.4*  CL 103 101 97* 97* 98  CO2 28 30 33* 33* 35*  GLUCOSE 171* 241* 210* 167* 167*  BUN 36* 36* 32* 29* 27*  CREATININE 0.82 0.69 0.73 0.57 0.55  CALCIUM 8.3* 8.2* 8.0* 8.0* 8.1*  MG  --   --  2.3  --   --    Liver Function Tests: No results for input(s): AST, ALT, ALKPHOS, BILITOT, PROT, ALBUMIN in the last 168 hours. No results for input(s): LIPASE, AMYLASE in the last 168 hours. No results for input(s): AMMONIA in the last 168 hours. CBC: Recent Labs  Lab 03/16/19 0339 03/17/19 0423 03/18/19 0415 03/19/19 0417  WBC 15.1* 12.8* 13.0* 14.4*  HGB 9.8* 9.8* 10.1* 10.5*  HCT 34.0* 33.5* 34.6* 35.6*  MCV 94.2 92.3 92.0 91.3  PLT 571* 598* 655* 640*   Cardiac Enzymes: No results for input(s): CKTOTAL, CKMB, CKMBINDEX, TROPONINI in the last 168 hours. BNP: Invalid input(s): POCBNP CBG: Recent Labs  Lab 03/21/19 0730 03/21/19 1156 03/21/19 1654 03/21/19 2043 03/22/19 0740  GLUCAP 140* 140* 99 110* 130*   D-Dimer No results for input(s): DDIMER in the last 72 hours. Hgb A1c No results for input(s): HGBA1C in the last 72 hours. Lipid Profile No results for input(s): CHOL, HDL, LDLCALC, TRIG, CHOLHDL, LDLDIRECT in the last 72 hours. Thyroid function studies No results for input(s): TSH, T4TOTAL, T3FREE, THYROIDAB in  the last 72 hours.  Invalid input(s): FREET3 Anemia work up No results for input(s): VITAMINB12, FOLATE, FERRITIN, TIBC, IRON, RETICCTPCT in the last 72 hours. Urinalysis    Component Value Date/Time   COLORURINE YELLOW 03/06/2019 0848   APPEARANCEUR CLEAR 03/06/2019 0848   APPEARANCEUR Clear 10/02/2014 1645   LABSPEC >1.046 (H) 03/06/2019 0848   LABSPEC 1.018 10/02/2014 1645   PHURINE 6.0 03/06/2019 0848   GLUCOSEU NEGATIVE 03/06/2019 0848   GLUCOSEU Negative 10/02/2014 1645   HGBUR SMALL (A) 03/06/2019 0848   BILIRUBINUR NEGATIVE 03/06/2019 0848   BILIRUBINUR Negative 10/02/2014 [place] 03/06/2019 0848   PROTEINUR NEGATIVE 03/06/2019 0848   UROBILINOGEN 1.0 01/31/2014 0745   NITRITE NEGATIVE 03/06/2019 0848   LEUKOCYTESUR NEGATIVE 03/06/2019 0848   LEUKOCYTESUR Negative 10/02/2014 1645   Sepsis Labs Invalid input(s): PROCALCITONIN,  WBC,  LACTICIDVEN Microbiology Recent Results (from the past 240 hour(s))  SARS Coronavirus 2 [facility] order, Performed in [facility] hospital lab) Nasopharyngeal  Nasopharyngeal Swab     Status: None   Collection Time: 03/14/19  7:35 AM   Specimen: Nasopharyngeal Swab  Result Value Ref Range Status   SARS Coronavirus 2 NEGATIVE NEGATIVE Final    Comment: (NOTE) If result is NEGATIVE SARS-CoV-2 target nucleic acids are NOT DETECTED. The SARS-CoV-2 RNA is generally detectable in upper and lower  respiratory specimens during the acute phase of infection. The lowest  concentration of SARS-CoV-2 viral copies this assay can detect is 250  copies / mL. A negative result does not preclude SARS-CoV-2 infection  and should not be used as the sole basis for treatment or other  patient management decisions.  A negative result may occur with  improper specimen collection / handling, submission of specimen other  than nasopharyngeal swab, presence of viral mutation(s) within the  areas targeted by this assay, and inadequate number of  viral copies  (<250 copies / mL). A negative result must be combined with clinical  observations, patient history, and epidemiological information. If result is POSITIVE SARS-CoV-2 target nucleic acids are DETECTED. The SARS-CoV-2 RNA is generally detectable in upper and lower  respiratory specimens dur ing the acute phase of infection.  Positive  results are indicative of active infection with SARS-CoV-2.  Clinical  correlation with patient history and other diagnostic information is  necessary to determine patient infection status.  Positive results do  not rule out bacterial infection or co-infection with other viruses. If result is PRESUMPTIVE POSTIVE SARS-CoV-2 nucleic acids MAY BE PRESENT.   A presumptive positive result was obtained on the submitted specimen  and confirmed on repeat testing.  While 2019 novel coronavirus  (SARS-CoV-2) nucleic acids may be present in the submitted sample  additional confirmatory testing may be necessary for epidemiological  and / or clinical management purposes  to differentiate between  SARS-CoV-2 and other Sarbecovirus currently known to infect humans.  If clinically indicated additional testing with an alternate test  methodology [phone]) is advised. The SARS-CoV-2 RNA is generally  detectable in upper and lower respiratory sp ecimens during the acute  phase of infection. The expected result is Negative. Fact Sheet for Patients:  StrictlyIdeas.no Fact Sheet for Healthcare Providers: BankingDealers.co.za This test is not yet approved or cleared by the [place] FDA and has been authorized for detection and/or diagnosis of SARS-CoV-2 by FDA under an Emergency Use Authorization (EUA).  This EUA will remain in effect (meaning this test can be used) for the duration of the COVID-19 declaration under Section 564(b)(1) of the Act, 21 U.S.C. section 360bbb-3(b)(1), unless the authorization is  terminated or revoked sooner. Performed at [facility], [place] [address] [place], [place] [address]   SARS CORONAVIRUS 2 (TAT 6-24 HRS) Nasopharyngeal Nasopharyngeal Swab     Status: None   Collection Time: 03/20/19  8:24 AM   Specimen: Nasopharyngeal Swab  Result Value Ref Range Status   SARS Coronavirus 2 NEGATIVE NEGATIVE Final    Comment: (NOTE) SARS-CoV-2 target nucleic acids are NOT DETECTED. The SARS-CoV-2 RNA is generally detectable in upper and lower respiratory specimens during the acute phase of infection. Negative results do not preclude SARS-CoV-2 infection, do not rule out co-infections with other pathogens, and should not be used as the sole basis for treatment or other patient management decisions. Negative results must be combined with clinical observations, patient history, and epidemiological information. The expected result is Negative. Fact Sheet for Patients: SugarRoll.be Fact Sheet for Healthcare Providers: https://www.woods-mathews.com/ This test is not yet approved  or cleared by the [place] and  has been authorized for detection and/or diagnosis of SARS-CoV-2 by FDA under an Emergency Use Authorization (EUA). This EUA will remain  in effect (meaning this test can be used) for the duration of the COVID-19 declaration under Section 56 4(b)(1) of the Act, 21 U.S.C. section 360bbb-3(b)(1), unless the authorization is terminated or revoked sooner. Performed at [facility], [place] [address], [place], [place] [address]      Time coordinating discharge: 45 minutes  SIGNED:   Tawni Millers, MD  [facility] 03/22/2019, 9:13 AM

## 2019-03-25 DIAGNOSIS — I5033 Acute on chronic diastolic (congestive) heart failure: Secondary | ICD-10-CM | POA: Diagnosis not present

## 2019-03-25 DIAGNOSIS — R031 Nonspecific low blood-pressure reading: Secondary | ICD-10-CM | POA: Diagnosis not present

## 2019-03-25 DIAGNOSIS — I482 Chronic atrial fibrillation, unspecified: Secondary | ICD-10-CM | POA: Diagnosis not present

## 2019-03-25 DIAGNOSIS — F419 Anxiety disorder, unspecified: Secondary | ICD-10-CM | POA: Diagnosis not present

## 2019-03-25 DIAGNOSIS — R600 Localized edema: Secondary | ICD-10-CM | POA: Diagnosis not present

## 2019-03-25 DIAGNOSIS — J9611 Chronic respiratory failure with hypoxia: Secondary | ICD-10-CM | POA: Diagnosis not present

## 2019-03-25 DIAGNOSIS — J449 Chronic obstructive pulmonary disease, unspecified: Secondary | ICD-10-CM | POA: Diagnosis not present

## 2019-03-25 DIAGNOSIS — Z6841 Body Mass Index (BMI) 40.0 and over, adult: Secondary | ICD-10-CM | POA: Diagnosis not present

## 2019-03-26 DIAGNOSIS — J9621 Acute and chronic respiratory failure with hypoxia: Secondary | ICD-10-CM | POA: Diagnosis not present

## 2019-03-26 DIAGNOSIS — I480 Paroxysmal atrial fibrillation: Secondary | ICD-10-CM | POA: Diagnosis not present

## 2019-03-26 DIAGNOSIS — I5033 Acute on chronic diastolic (congestive) heart failure: Secondary | ICD-10-CM | POA: Diagnosis not present

## 2019-03-26 DIAGNOSIS — I10 Essential (primary) hypertension: Secondary | ICD-10-CM | POA: Diagnosis not present

## 2019-03-27 ENCOUNTER — Ambulatory Visit: Payer: Medicare HMO | Admitting: Student

## 2019-03-27 DIAGNOSIS — J449 Chronic obstructive pulmonary disease, unspecified: Secondary | ICD-10-CM | POA: Diagnosis not present

## 2019-03-27 DIAGNOSIS — F419 Anxiety disorder, unspecified: Secondary | ICD-10-CM | POA: Diagnosis not present

## 2019-03-27 DIAGNOSIS — I5033 Acute on chronic diastolic (congestive) heart failure: Secondary | ICD-10-CM | POA: Diagnosis not present

## 2019-03-27 DIAGNOSIS — R600 Localized edema: Secondary | ICD-10-CM | POA: Diagnosis not present

## 2019-03-27 DIAGNOSIS — R0602 Shortness of breath: Secondary | ICD-10-CM | POA: Diagnosis not present

## 2019-03-27 DIAGNOSIS — J9611 Chronic respiratory failure with hypoxia: Secondary | ICD-10-CM | POA: Diagnosis not present

## 2019-03-27 NOTE — Telephone Encounter (Signed)
Forms signed by neal and faxed again. I have a copy at my desk, if [place] calls again. She gave me her personal fax number this time, so hopeful she receives document.

## 2019-03-28 ENCOUNTER — Encounter: Payer: Self-pay | Admitting: *Deleted

## 2019-03-28 ENCOUNTER — Other Ambulatory Visit: Payer: Self-pay | Admitting: *Deleted

## 2019-03-28 NOTE — Patient Outreach (Signed)
[facility]) Care Management  03/28/2019  [name] White [birth year] [id number]   CSW was able to make contact with patient today to follow-up regarding social work services and resources, as well as to ensure that patient will be residing at [facility] [place], for long-term care services.  Patient stated, "That is definitely my plan, because I can no longer live alone".  Patient went on to say that once she has completed short-term rehabilitative services, she will then be moved into a Long-Term Care Medicaid bed, at the same facility.  Patient admitted that she is pleased with her current place of residence.  CSW will perform a case closure on patient, as all goals of treatment have been met from social work standpoint and no additional social work needs have been identified at this time.  CSW was able to confirm with patient that she has the correct contact information for CSW, encouraging patient to contact CSW directly if additional social work needs arise in the near future.  CSW will fax an update to patient's Primary Care Physician, Dr. [name] to ensure that they are aware of CSW's involvement with patient's plan of care.    [name], BSW, MSW, LCSW  Licensed Education officer, [facility]  Mailing [place] [address] [place], [place] [address] Physical Address-[address], [place], [place] [address] Toll Free Main # [phone] Fax # [phone] Cell # [phone]  Office # [phone] [name]

## 2019-03-29 ENCOUNTER — Ambulatory Visit: Payer: Medicare HMO | Admitting: *Deleted

## 2019-03-29 DIAGNOSIS — I5033 Acute on chronic diastolic (congestive) heart failure: Secondary | ICD-10-CM | POA: Diagnosis not present

## 2019-03-29 DIAGNOSIS — R0602 Shortness of breath: Secondary | ICD-10-CM | POA: Diagnosis not present

## 2019-03-29 DIAGNOSIS — J441 Chronic obstructive pulmonary disease with (acute) exacerbation: Secondary | ICD-10-CM | POA: Diagnosis not present

## 2019-03-29 DIAGNOSIS — F419 Anxiety disorder, unspecified: Secondary | ICD-10-CM | POA: Diagnosis not present

## 2019-03-29 DIAGNOSIS — R0989 Other specified symptoms and signs involving the circulatory and respiratory systems: Secondary | ICD-10-CM | POA: Diagnosis not present

## 2019-03-29 DIAGNOSIS — J9811 Atelectasis: Secondary | ICD-10-CM | POA: Diagnosis not present

## 2019-03-29 DIAGNOSIS — R0600 Dyspnea, unspecified: Secondary | ICD-10-CM | POA: Diagnosis not present

## 2019-03-29 DIAGNOSIS — R5381 Other malaise: Secondary | ICD-10-CM | POA: Diagnosis not present

## 2019-03-29 DIAGNOSIS — R600 Localized edema: Secondary | ICD-10-CM | POA: Diagnosis not present

## 2019-03-30 DIAGNOSIS — R062 Wheezing: Secondary | ICD-10-CM | POA: Diagnosis not present

## 2019-03-30 DIAGNOSIS — R0989 Other specified symptoms and signs involving the circulatory and respiratory systems: Secondary | ICD-10-CM | POA: Diagnosis not present

## 2019-03-30 DIAGNOSIS — R05 Cough: Secondary | ICD-10-CM | POA: Diagnosis not present

## 2019-04-05 DIAGNOSIS — I5033 Acute on chronic diastolic (congestive) heart failure: Secondary | ICD-10-CM | POA: Diagnosis not present

## 2019-04-05 DIAGNOSIS — R5381 Other malaise: Secondary | ICD-10-CM | POA: Diagnosis not present

## 2019-04-05 DIAGNOSIS — F064 Anxiety disorder due to known physiological condition: Secondary | ICD-10-CM | POA: Diagnosis not present

## 2019-04-05 DIAGNOSIS — R600 Localized edema: Secondary | ICD-10-CM | POA: Diagnosis not present

## 2019-04-05 DIAGNOSIS — F419 Anxiety disorder, unspecified: Secondary | ICD-10-CM | POA: Diagnosis not present

## 2019-04-05 DIAGNOSIS — R0989 Other specified symptoms and signs involving the circulatory and respiratory systems: Secondary | ICD-10-CM | POA: Diagnosis not present

## 2019-04-05 DIAGNOSIS — R0600 Dyspnea, unspecified: Secondary | ICD-10-CM | POA: Diagnosis not present

## 2019-04-05 DIAGNOSIS — J441 Chronic obstructive pulmonary disease with (acute) exacerbation: Secondary | ICD-10-CM | POA: Diagnosis not present

## 2019-04-05 DIAGNOSIS — J189 Pneumonia, unspecified organism: Secondary | ICD-10-CM | POA: Diagnosis not present

## 2019-04-05 DIAGNOSIS — F331 Major depressive disorder, recurrent, moderate: Secondary | ICD-10-CM | POA: Diagnosis not present

## 2019-04-05 DIAGNOSIS — R0602 Shortness of breath: Secondary | ICD-10-CM | POA: Diagnosis not present

## 2019-04-05 DIAGNOSIS — R062 Wheezing: Secondary | ICD-10-CM | POA: Diagnosis not present

## 2019-04-08 DIAGNOSIS — I5033 Acute on chronic diastolic (congestive) heart failure: Secondary | ICD-10-CM | POA: Diagnosis not present

## 2019-04-08 DIAGNOSIS — G4700 Insomnia, unspecified: Secondary | ICD-10-CM | POA: Diagnosis not present

## 2019-04-08 DIAGNOSIS — J449 Chronic obstructive pulmonary disease, unspecified: Secondary | ICD-10-CM | POA: Diagnosis not present

## 2019-04-08 DIAGNOSIS — R600 Localized edema: Secondary | ICD-10-CM | POA: Diagnosis not present

## 2019-04-08 DIAGNOSIS — F419 Anxiety disorder, unspecified: Secondary | ICD-10-CM | POA: Diagnosis not present

## 2019-04-09 DIAGNOSIS — J9611 Chronic respiratory failure with hypoxia: Secondary | ICD-10-CM | POA: Diagnosis not present

## 2019-04-11 DIAGNOSIS — R0602 Shortness of breath: Secondary | ICD-10-CM | POA: Diagnosis not present

## 2019-04-11 DIAGNOSIS — M6281 Muscle weakness (generalized): Secondary | ICD-10-CM | POA: Diagnosis not present

## 2019-04-11 DIAGNOSIS — I5033 Acute on chronic diastolic (congestive) heart failure: Secondary | ICD-10-CM | POA: Diagnosis not present

## 2019-04-11 DIAGNOSIS — R062 Wheezing: Secondary | ICD-10-CM | POA: Diagnosis not present

## 2019-04-11 DIAGNOSIS — R600 Localized edema: Secondary | ICD-10-CM | POA: Diagnosis not present

## 2019-04-11 DIAGNOSIS — F419 Anxiety disorder, unspecified: Secondary | ICD-10-CM | POA: Diagnosis not present

## 2019-04-11 DIAGNOSIS — J449 Chronic obstructive pulmonary disease, unspecified: Secondary | ICD-10-CM | POA: Diagnosis not present

## 2019-04-12 DIAGNOSIS — R0602 Shortness of breath: Secondary | ICD-10-CM | POA: Diagnosis not present

## 2019-04-12 DIAGNOSIS — I5033 Acute on chronic diastolic (congestive) heart failure: Secondary | ICD-10-CM | POA: Diagnosis not present

## 2019-04-12 DIAGNOSIS — R600 Localized edema: Secondary | ICD-10-CM | POA: Diagnosis not present

## 2019-04-12 DIAGNOSIS — J449 Chronic obstructive pulmonary disease, unspecified: Secondary | ICD-10-CM | POA: Diagnosis not present

## 2019-04-12 DIAGNOSIS — M6281 Muscle weakness (generalized): Secondary | ICD-10-CM | POA: Diagnosis not present

## 2019-04-15 DIAGNOSIS — R0989 Other specified symptoms and signs involving the circulatory and respiratory systems: Secondary | ICD-10-CM | POA: Diagnosis not present

## 2019-04-15 DIAGNOSIS — R0602 Shortness of breath: Secondary | ICD-10-CM | POA: Diagnosis not present

## 2019-04-15 DIAGNOSIS — J449 Chronic obstructive pulmonary disease, unspecified: Secondary | ICD-10-CM | POA: Diagnosis not present

## 2019-04-15 DIAGNOSIS — I5033 Acute on chronic diastolic (congestive) heart failure: Secondary | ICD-10-CM | POA: Diagnosis not present

## 2019-05-02 DIAGNOSIS — F331 Major depressive disorder, recurrent, moderate: Secondary | ICD-10-CM | POA: Diagnosis not present

## 2019-05-02 DIAGNOSIS — F064 Anxiety disorder due to known physiological condition: Secondary | ICD-10-CM | POA: Diagnosis not present

## 2019-05-03 DIAGNOSIS — R0600 Dyspnea, unspecified: Secondary | ICD-10-CM | POA: Diagnosis not present

## 2019-05-06 DIAGNOSIS — R062 Wheezing: Secondary | ICD-10-CM | POA: Diagnosis not present

## 2019-05-06 DIAGNOSIS — R0989 Other specified symptoms and signs involving the circulatory and respiratory systems: Secondary | ICD-10-CM | POA: Diagnosis not present

## 2019-05-06 DIAGNOSIS — R0602 Shortness of breath: Secondary | ICD-10-CM | POA: Diagnosis not present

## 2019-05-06 DIAGNOSIS — F411 Generalized anxiety disorder: Secondary | ICD-10-CM | POA: Diagnosis not present

## 2019-05-06 DIAGNOSIS — M6281 Muscle weakness (generalized): Secondary | ICD-10-CM | POA: Diagnosis not present

## 2019-05-06 DIAGNOSIS — J449 Chronic obstructive pulmonary disease, unspecified: Secondary | ICD-10-CM | POA: Diagnosis not present

## 2019-05-06 DIAGNOSIS — R031 Nonspecific low blood-pressure reading: Secondary | ICD-10-CM | POA: Diagnosis not present

## 2019-05-06 DIAGNOSIS — I5033 Acute on chronic diastolic (congestive) heart failure: Secondary | ICD-10-CM | POA: Diagnosis not present

## 2019-05-06 DIAGNOSIS — R05 Cough: Secondary | ICD-10-CM | POA: Diagnosis not present

## 2019-05-08 DIAGNOSIS — J449 Chronic obstructive pulmonary disease, unspecified: Secondary | ICD-10-CM | POA: Diagnosis not present

## 2019-05-08 DIAGNOSIS — R05 Cough: Secondary | ICD-10-CM | POA: Diagnosis not present

## 2019-05-08 DIAGNOSIS — R0602 Shortness of breath: Secondary | ICD-10-CM | POA: Diagnosis not present

## 2019-05-08 DIAGNOSIS — M6281 Muscle weakness (generalized): Secondary | ICD-10-CM | POA: Diagnosis not present

## 2019-05-08 DIAGNOSIS — I5033 Acute on chronic diastolic (congestive) heart failure: Secondary | ICD-10-CM | POA: Diagnosis not present

## 2019-05-08 DIAGNOSIS — R062 Wheezing: Secondary | ICD-10-CM | POA: Diagnosis not present

## 2019-05-09 ENCOUNTER — Ambulatory Visit (INDEPENDENT_AMBULATORY_CARE_PROVIDER_SITE_OTHER): Payer: Medicare HMO | Admitting: Primary Care

## 2019-05-09 ENCOUNTER — Other Ambulatory Visit: Payer: Self-pay

## 2019-05-09 ENCOUNTER — Encounter: Payer: Self-pay | Admitting: Primary Care

## 2019-05-09 VITALS — BP 124/88 | HR 98 | Temp 97.2°F | Wt 355.0 lb

## 2019-05-09 DIAGNOSIS — F411 Generalized anxiety disorder: Secondary | ICD-10-CM | POA: Diagnosis not present

## 2019-05-09 DIAGNOSIS — R05 Cough: Secondary | ICD-10-CM | POA: Diagnosis not present

## 2019-05-09 DIAGNOSIS — J4541 Moderate persistent asthma with (acute) exacerbation: Secondary | ICD-10-CM | POA: Diagnosis not present

## 2019-05-09 DIAGNOSIS — J4521 Mild intermittent asthma with (acute) exacerbation: Secondary | ICD-10-CM

## 2019-05-09 DIAGNOSIS — R062 Wheezing: Secondary | ICD-10-CM | POA: Diagnosis not present

## 2019-05-09 DIAGNOSIS — R0602 Shortness of breath: Secondary | ICD-10-CM | POA: Diagnosis not present

## 2019-05-09 DIAGNOSIS — J449 Chronic obstructive pulmonary disease, unspecified: Secondary | ICD-10-CM | POA: Diagnosis not present

## 2019-05-09 DIAGNOSIS — J40 Bronchitis, not specified as acute or chronic: Secondary | ICD-10-CM | POA: Diagnosis not present

## 2019-05-09 DIAGNOSIS — I5032 Chronic diastolic (congestive) heart failure: Secondary | ICD-10-CM | POA: Diagnosis not present

## 2019-05-09 DIAGNOSIS — M6281 Muscle weakness (generalized): Secondary | ICD-10-CM | POA: Diagnosis not present

## 2019-05-09 MED ORDER — BENZONATATE 200 MG PO CAPS: 200.0000 mg | ORAL_CAPSULE | Freq: Three times a day (TID) | ORAL | 1 refills | Status: DC | PRN

## 2019-05-09 MED ORDER — AZELASTINE HCL 0.1 % NA SOLN: 2.0000 | Freq: Two times a day (BID) | NASAL | 1 refills | Status: DC

## 2019-05-09 MED ORDER — METHYLPREDNISOLONE ACETATE 80 MG/ML IJ SUSP: 80.0000 mg | Freq: Once | INTRAMUSCULAR | Status: DC

## 2019-05-09 MED ORDER — MONTELUKAST SODIUM 10 MG PO TABS: 10.0000 mg | ORAL_TABLET | Freq: Every day | ORAL | 5 refills | Status: DC

## 2019-05-09 MED ORDER — OMEPRAZOLE 20 MG PO CPDR: 20.0000 mg | DELAYED_RELEASE_CAPSULE | Freq: Every day | ORAL | 5 refills | Status: DC

## 2019-05-09 MED ORDER — DOXYCYCLINE HYCLATE 100 MG PO TABS: 100.0000 mg | ORAL_TABLET | Freq: Two times a day (BID) | ORAL | 0 refills | Status: DC

## 2019-05-09 MED ORDER — METHYLPREDNISOLONE ACETATE 80 MG/ML IJ SUSP
80.0000 mg | Freq: Once | INTRAMUSCULAR | Status: AC
  Administered 2019-05-09: 80 mg via INTRAMUSCULAR

## 2019-05-09 MED ORDER — PREDNISONE 10 MG PO TABS: ORAL_TABLET | ORAL | 0 refills | Status: DC

## 2019-05-09 NOTE — Progress Notes (Signed)
@Patient  ID: Hustler Desanctis, female    DOB: 04-26-1956, 63 y.o.   MRN: [id number]  Chief Complaint  Patient presents with  . Acute Visit    coughing, wheezing, increased shortness of breath    Referring provider: [name], MD  HPI: 63 year old female, former smoker. PMH significant for vocal cord dysfunction, sleep apnea, obesity hypoventilation syndrome, asthma/COPD, acute on chronic respiratory failure with hypoxemia, GERD, Afib (on anticoagulation), HTN. Patient of Dr. Lamonte Sakai, last seen by pulmonary NP on 10/16/18. Wheezing felt to be related to upper airway. No obstruction or BD response on PFTs. She has been referred to speech therapy, recommend max GERD and PND treatment for VCD. Needs sleep study.   05/09/2019 Patient presents today for acute office vsiit. Reports increased shortness of breath and wheezing over the last couple of months. She has a tight/congested cough, unable to produce mucus. She was restarted on Symbicort on 05/04/19 by facility. She is taking Flonase nasal spray, Claritin and Pepcid as instructed. On Lasix twice day. BNP 19 on most recent lab work. Patient declined ED evaluation. VSS. Afebrile.   Allergies  Allergen Reactions  . Caffeine Other (See Comments)    Migraine  . Hydralazine Hcl     Other reaction(s): Other (See Comments) AKI leading to rhabdo and electrolyte abnormalities  . Hydrocodone Nausea And Vomiting    Headache  . Ciprofloxacin Hives and Rash  . Erythromycin Hives and Rash  . Lisinopril Cough  . Oxycodone Nausea And Vomiting    Headache  . Sulfamethoxazole-Trimethoprim Rash    Immunization History  Administered Date(s) Administered  . Influenza Split 08/18/2012  . Influenza Whole 04/18/2008  . Influenza,inj,Quad PF,6+ Mos 04/07/2013, 03/23/2015, 03/15/2019  . Influenza-Unspecified 05/08/2019  . Pneumococcal Polysaccharide-23 03/27/2005, 09/29/2014  . Td 01/25/1998, 04/18/2008    Past Medical History:  Diagnosis Date   . Abdominal wall hematoma 03/06/2019  . Acute bronchitis 08/29/2018  . Acute on chronic respiratory failure with hypoxia ([place]) 08/29/2018  . Anxiety   . Asthma   . Chronic lower back pain   . COPD (chronic obstructive pulmonary disease) ([place])   . Depression   . Exposure to COVID-19 virus 11/02/2018  . Fall 02/2019  . Family history of anesthesia complication    "daughter; causes her to pass out afterwards"  . GERD (gastroesophageal reflux disease)   . History of atrial flutter 06/26/2015  . History of pulmonary embolus (PE) 11/03/2014  . Hypertension   . Hyperthyroidism   . Left medial tibial stress syndrome 12/26/2017  . Lower GI bleed   . Migraine    "monthly" (12/28/2013)  . Non-traumatic rhabdomyolysis 11/03/2014  . Obesity hypoventilation syndrome ([place]) 03/06/2019  . Osteoarthritis    "both knees; back of my neck; right pelvic bone" (12/28/2013)  . Paroxysmal A-fib ([place])   . Pulmonary embolism ([place]) 12/28/2013   "2 clots in each lung"    Tobacco History: Social History   Tobacco Use  Smoking Status Former Smoker  . Packs/day: 0.25  . Years: 20.00  . Pack years: 5.00  . Types: Cigarettes  . Quit date: 07/17/1999  . Years since quitting: 19.8  Smokeless Tobacco Never Used   Counseling given: Not Answered   Outpatient Medications Prior to Visit  Medication Sig Dispense Refill  . acetaminophen (TYLENOL) 325 MG tablet Take 2 tablets (650 mg total) by mouth every 6 (six) hours as needed for mild pain.    Marland Kitchen albuterol (PROVENTIL) (2.5 MG/3ML) 0.083% nebulizer solution Take 3  mLs (2.5 mg total) by nebulization every 6 (six) hours as needed for wheezing or shortness of breath. 75 mL 4  . albuterol (VENTOLIN HFA) 108 (90 Base) MCG/ACT inhaler Inhale 1-2 puffs into the lungs every 6 (six) hours as needed for wheezing or shortness of breath.    Marland Kitchen apixaban (ELIQUIS) 5 MG TABS tablet Take 1 tablet (5 mg total) by mouth 2 (two) times daily. 60 tablet 0  . azelastine (OPTIVAR) 0.05 % ophthalmic  solution Place 1 drop into both eyes 2 (two) times daily as needed (itchy eyes).    . budesonide-formoterol (SYMBICORT) 80-4.5 MCG/ACT inhaler Inhale 2 puffs into the lungs 2 (two) times daily.    . cyclobenzaprine (FLEXERIL) 10 MG tablet TAKE 1 TABLET BY MOUTH EVERY 8 HOURS AS NEEDED FOR PAIN 60 tablet 0  . cycloSPORINE (RESTASIS) 0.05 % ophthalmic emulsion Place 1 drop into both eyes 2 (two) times daily.    . diclofenac sodium (VOLTAREN) 1 % GEL APPLY 2 GRAMS EXTERNALLY TO THE AFFECTED AREA FOUR TIMES DAILY (Patient taking differently: Apply 2 g topically 4 (four) times daily as needed (pain/ shoulder & right theigh). ) 100 g 0  . diltiazem (CARDIZEM CD) 120 MG 24 hr capsule Take 1 capsule (120 mg total) by mouth daily. Please keep upcoming appt in January before anymore refills. Final Attempt (Patient taking differently: Take 120 mg by mouth at bedtime. Please keep upcoming appt in January before anymore refills. Final Attempt) 90 capsule 3  . famotidine (PEPCID) 20 MG tablet Take 1 tablet (20 mg total) by mouth daily. 90 tablet 0  . fluticasone (FLONASE) 50 MCG/ACT nasal spray Place 2 sprays into both nostrils daily.     . hydrOXYzine (VISTARIL) 25 MG capsule Take 25 mg by mouth 2 (two) times daily as needed for anxiety.    Marland Kitchen loratadine (CLARITIN) 10 MG tablet TAKE 1 TABLET(10 MG) BY MOUTH DAILY (Patient taking differently: Take 10 mg by mouth daily. ) 90 tablet 0  . metoprolol tartrate (LOPRESSOR) 25 MG tablet TAKE 1 TABLET BY MOUTH TWICE DAILY (Patient taking differently: Take 12.5 mg by mouth 2 (two) times daily. ) 120 tablet 0  . OXYGEN 2L at bedtime and prn exertion    . venlafaxine (EFFEXOR) 37.5 MG tablet Take 187.5 mg by mouth daily. Take 37.5mg  tablet with 150mg     . venlafaxine XR (EFFEXOR-XR) 150 MG 24 hr capsule Take 187.5 mg by mouth daily with breakfast. Take 150mg  capsule with 37.5mg  tablet    . flecainide (TAMBOCOR) 100 MG tablet TAKE 1 TABLET BY MOUTH 2 TIMES A DAY. PLEASE KEEP  UPCOMING APPOINTMENT IN JANUARY BEFORE ANYMORE REFILLS. (Patient not taking: No sig reported) 60 tablet 6  . furosemide (LASIX) 40 MG tablet Take 1 tablet (40 mg total) by mouth daily. 30 tablet 0  . hydrocortisone (ANUSOL-HC) 25 MG suppository Place 1 suppository (25 mg total) rectally 2 (two) times daily. (Patient not taking: Reported on 03/14/2019) 12 suppository 0  . potassium chloride SA (K-DUR) 20 MEQ tablet Take 1 tablet (20 mEq total) by mouth daily. 30 tablet 0  . umeclidinium bromide (INCRUSE ELLIPTA) 62.5 MCG/INH AEPB Inhale 1 puff into the lungs daily. (Patient not taking: Reported on 05/09/2019) 7 each 0   No facility-administered medications prior to visit.    Review of Systems  Review of Systems  Constitutional: Negative.   Respiratory: Positive for cough, shortness of breath and wheezing.   Cardiovascular: Negative.    Physical Exam  BP 124/88 (  BP Location: Right Arm, Cuff Size: Large)   Pulse 98   Temp (!) 97.2 F (36.2 C) (Temporal)   Wt (!) 355 lb (161 kg)   SpO2 99%   BMI 59.08 kg/m  Physical Exam Constitutional:      Appearance: Normal appearance.  HENT:     Head: Normocephalic and atraumatic.  Cardiovascular:     Rate and Rhythm: Normal rate and regular rhythm.  Pulmonary:     Effort: Pulmonary effort is normal.     Breath sounds: Wheezing present.     Comments: Upper airway wheezing. O2 99% 2L.  Neurological:     General: No focal deficit present.     Mental Status: She is alert and oriented to person, place, and time. Mental status is at baseline.  Psychiatric:        Mood and Affect: Mood normal.        Behavior: Behavior normal.        Thought Content: Thought content normal.        Judgment: Judgment normal.      Lab Results:  CBC    Component Value Date/Time   WBC 14.4 (H) 03/19/2019 0417   RBC 3.90 03/19/2019 0417   HGB 10.5 (L) 03/19/2019 0417   HGB 12.9 11/27/2017 1458   HCT 35.6 (L) 03/19/2019 0417   HCT 37.6 11/27/2017 1458    PLT 640 (H) 03/19/2019 0417   PLT 412 11/27/2017 1458   MCV 91.3 03/19/2019 0417   MCV 95 11/27/2017 1458   MCH 26.9 03/19/2019 0417   MCHC 29.5 (L) 03/19/2019 0417   RDW 19.6 (H) 03/19/2019 0417   RDW 15.5 (H) 11/27/2017 1458   LYMPHSABS 0.9 03/14/2019 0750   MONOABS 0.3 03/14/2019 0750   EOSABS 0.1 03/14/2019 0750   BASOSABS 0.0 03/14/2019 0750    BMET    Component Value Date/Time   NA 139 03/22/2019 0439   NA 141 11/27/2017 1458   NA 135 10/01/2014 1307   K 3.4 (L) 03/22/2019 0439   K 3.0 (L) 10/01/2014 1307   CL 98 03/22/2019 0439   CL 99 (L) 10/01/2014 1307   CO2 35 (H) 03/22/2019 0439   CO2 26 10/01/2014 1307   GLUCOSE 167 (H) 03/22/2019 0439   GLUCOSE 114 (H) 10/01/2014 1307   BUN 27 (H) 03/22/2019 0439   BUN 12 11/27/2017 1458   BUN 18 10/01/2014 1307   CREATININE 0.55 03/22/2019 0439   CREATININE 0.59 05/19/2015 1415   CALCIUM 8.1 (L) 03/22/2019 0439   CALCIUM 8.5 (L) 10/01/2014 1307   GFRNONAA >60 03/22/2019 0439   GFRNONAA >60 10/06/2014 0706   GFRAA >60 03/22/2019 0439   GFRAA >60 10/06/2014 0706    BNP    Component Value Date/Time   BNP 68.6 03/14/2019 0750    ProBNP    Component Value Date/Time   PROBNP 1,316.0 (H) 01/31/2014 0516    Imaging: No results found.   Assessment & Plan:   Reactive airway disease - Asthma like symptoms with acute URI/bronchitis. Most of her upper airway wheezing is d/t VCD - Max GERD and PND treatment with addition of Omeprazole 20mg  daily, Astelin nasal spray twice daily, Singulair 10mg  at bedtime. Continue Pepcid, Loratidine 10mg  daily, Flonase nasal spray - Continue Symbicort twice daily (for now, may consider discontinuing worsening VCD) - FU in 5 days with Dr. Lamonte Sakai or Eustaquio Maize NP  Bronchitis with acute wheezing - Received depo-medrol 80mg  IM x1 in office - Rx doxycycline 1 tab twice  daily x 7 days - Prednisone taper (50mg  x 3 days; 40mg  x 3 days; 30mg  x 3 days; 20mg  x 3 days) - Tessalon perles TID for  cough suppression  - Continue Albuterol nebulizer q6 hours scheduled for the next week   Martyn Ehrich, NP 05/09/2019

## 2019-05-09 NOTE — Assessment & Plan Note (Addendum)
-   Asthma like symptoms with acute URI/bronchitis. Most of her upper airway wheezing is d/t VCD - Max GERD and PND treatment with addition of Omeprazole 20mg  daily, Astelin nasal spray twice daily, Singulair 10mg  at bedtime. Continue Pepcid, Loratidine 10mg  daily, Flonase nasal spray - Continue Symbicort twice daily (for now, may consider discontinuing worsening VCD) - FU in 5 days with Dr. Lamonte Sakai or Eustaquio Maize NP

## 2019-05-09 NOTE — Assessment & Plan Note (Addendum)
-   Received depo-medrol 80mg  IM x1 in office - Rx doxycycline 1 tab twice daily x 7 days - Prednisone taper (50mg  x 3 days; 40mg  x 3 days; 30mg  x 3 days; 20mg  x 3 days) - Tessalon perles TID for cough suppression  - Continue Albuterol nebulizer q6 hours scheduled for the next week

## 2019-05-09 NOTE — Patient Instructions (Addendum)
Office treatment: Depomedrol 80mg  x 1  Rx: Doxycycline 1 tab twice daily x 7 days  Prednisone taper (50mg  x 3 days; 40mg  x 3 days; 30mg  x 3 days; 20mg  x 3 days then stop) Astelin nasal spray twice daily  Singulair 10mg  at bedtime  Omeprazole 20mg  daily Tessalone perles three times a day for cough  Recommendations: Continue flonase nasal spray once daily  Continue famotidine 20mg  at bedtime  Continue Claritin 10mg  daily   Continue Symbicort twice daily (for now, may consider discontinuing worsening VCD) Continue albuterol nebulizer every 6 hours scheduled for 5 days then prn shortness of breath/wheezing  * Avoid coughing by having sips of water throughout the day and using sugar free lozengers  Follow-up: 5 day follow-up with Dr. Lamonte Sakai or Eustaquio Maize NP

## 2019-05-10 DIAGNOSIS — J9611 Chronic respiratory failure with hypoxia: Secondary | ICD-10-CM | POA: Diagnosis not present

## 2019-05-13 ENCOUNTER — Encounter: Payer: Self-pay | Admitting: Primary Care

## 2019-05-13 ENCOUNTER — Ambulatory Visit (INDEPENDENT_AMBULATORY_CARE_PROVIDER_SITE_OTHER): Payer: Medicare HMO | Admitting: Primary Care

## 2019-05-13 ENCOUNTER — Other Ambulatory Visit: Payer: Self-pay

## 2019-05-13 DIAGNOSIS — J383 Other diseases of vocal cords: Secondary | ICD-10-CM

## 2019-05-13 DIAGNOSIS — J40 Bronchitis, not specified as acute or chronic: Secondary | ICD-10-CM

## 2019-05-13 MED ORDER — DEXTROMETHORPHAN HBR 15 MG/5ML PO SYRP: 10.0000 mL | ORAL_SOLUTION | Freq: Four times a day (QID) | ORAL | 1 refills | Status: DC | PRN

## 2019-05-13 NOTE — Assessment & Plan Note (Deleted)
-   Improved, no overt wheezing on exam - Continue max GERD and PND treatment - No obstruction or BD response on PFTs  - Stop Symbicort as it can worsen VCD, if breathing deteriorates ok to resume

## 2019-05-13 NOTE — Assessment & Plan Note (Signed)
-   No obstruction or BD response on PFTs  - Stop Symbicort as it can worsen VCD, if breathing deteriorates ok to resume  - Continue max GERD and PND treatment

## 2019-05-13 NOTE — Progress Notes (Signed)
@Patient  ID: [name] White, female    DOB: [birth year], 63 y.o.   MRN: [id number]  Chief Complaint  Patient presents with  . Mild intermittent astham with acute exacerbation    follow up appointment.    Referring provider: [name], MD  HPI: 63 year old female, former smoker. PMH significant for vocal cord dysfunction, sleep apnea, obesity hypoventilation syndrome, asthma/COPD, acute on chronic respiratory failure with hypoxemia, GERD, Afib (on anticoagulation), HTN. Patient of Dr. Lamonte Sakai, last seen by pulmonary NP on 10/16/18. Wheezing felt to be related to upper airway. No obstruction or BD response on PFTs. She has been referred to speech therapy, recommend max GERD and PND treatment for VCD. Needs sleep study.   Previous [facility] pulmonary encounter: 05/09/2019 Patient presents today for acute office vsiit. Reports increased shortness of breath and wheezing over the last couple of months. She has a tight/congested cough, unable to produce mucus. She was restarted on Symbicort on 05/04/19 by facility. She is taking Flonase nasal spray, Claritin and Pepcid as instructed. On Lasix twice day. BNP 19 on most recent lab work. Patient declined ED evaluation. VSS. Afebrile.   05/13/2019 Patient presents today for 4-5 day follow-up for acute bronchitis with bronchospasm and vocal cord dysfunction. She was given Depo-medrol shot in office and started on high dose prednisone taper and well as course of Doxycycline. Recommended max PND and GERD treatment for VCD. She is doing much better today. States that she is able to breath easier. Tessalon perles have not helped with cough suppression.   Allergies  Allergen Reactions  . Caffeine Other (See Comments)    Migraine  . Hydralazine Hcl     Other reaction(s): Other (See Comments) AKI leading to rhabdo and electrolyte abnormalities  . Hydrocodone Nausea And Vomiting    Headache  . Ciprofloxacin Hives and Rash  . Erythromycin Hives and Rash  .  Lisinopril Cough  . Oxycodone Nausea And Vomiting    Headache  . Sulfamethoxazole-Trimethoprim Rash    Immunization History  Administered Date(s) Administered  . Influenza Split 08/18/2012  . Influenza Whole 04/18/2008  . Influenza,inj,Quad PF,6+ Mos 04/07/2013, 03/23/2015, 03/15/2019  . Influenza-Unspecified 05/08/2019  . Pneumococcal Polysaccharide-23 03/27/2005, 09/29/2014  . Td 01/25/1998, 04/18/2008    Past Medical History:  Diagnosis Date  . Abdominal wall hematoma 03/06/2019  . Acute bronchitis 08/29/2018  . Acute on chronic respiratory failure with hypoxia ([place]) 08/29/2018  . Anxiety   . Asthma   . Chronic lower back pain   . COPD (chronic obstructive pulmonary disease) ([place])   . Depression   . Exposure to COVID-19 virus 11/02/2018  . Fall 02/2019  . Family history of anesthesia complication    "daughter; causes her to pass out afterwards"  . GERD (gastroesophageal reflux disease)   . History of atrial flutter 06/26/2015  . History of pulmonary embolus (PE) 11/03/2014  . Hypertension   . Hyperthyroidism   . Left medial tibial stress syndrome 12/26/2017  . Lower GI bleed   . Migraine    "monthly" (12/28/2013)  . Non-traumatic rhabdomyolysis 11/03/2014  . Obesity hypoventilation syndrome ([place]) 03/06/2019  . Osteoarthritis    "both knees; back of my neck; right pelvic bone" (12/28/2013)  . Paroxysmal A-fib ([place])   . Pulmonary embolism ([place]) 12/28/2013   "2 clots in each lung"    Tobacco History: Social History   Tobacco Use  Smoking Status Former Smoker  . Packs/day: 0.25  . Years: 20.00  . Pack years: 5.00  .  Types: Cigarettes  . Quit date: 07/17/1999  . Years since quitting: 19.8  Smokeless Tobacco Never Used   Counseling given: Not Answered   Outpatient Medications Prior to Visit  Medication Sig Dispense Refill  . acetaminophen (TYLENOL) 325 MG tablet Take 2 tablets (650 mg total) by mouth every 6 (six) hours as needed for mild pain.    Marland Kitchen albuterol (PROVENTIL) (2.5  MG/3ML) 0.083% nebulizer solution Take 3 mLs (2.5 mg total) by nebulization every 6 (six) hours as needed for wheezing or shortness of breath. 75 mL 4  . albuterol (VENTOLIN HFA) 108 (90 Base) MCG/ACT inhaler Inhale 1-2 puffs into the lungs every 6 (six) hours as needed for wheezing or shortness of breath.    Marland Kitchen apixaban (ELIQUIS) 5 MG TABS tablet Take 1 tablet (5 mg total) by mouth 2 (two) times daily. 60 tablet 0  . azelastine (ASTELIN) 0.1 % nasal spray Place 2 sprays into both nostrils 2 (two) times daily. Use in each nostril as directed 30 mL 1  . azelastine (OPTIVAR) 0.05 % ophthalmic solution Place 1 drop into both eyes 2 (two) times daily as needed (itchy eyes).    . benzonatate (TESSALON) 200 MG capsule Take 1 capsule (200 mg total) by mouth 3 (three) times daily as needed for cough. 30 capsule 1  . budesonide-formoterol (SYMBICORT) 80-4.5 MCG/ACT inhaler Inhale 2 puffs into the lungs 2 (two) times daily.    . cyclobenzaprine (FLEXERIL) 10 MG tablet TAKE 1 TABLET BY MOUTH EVERY 8 HOURS AS NEEDED FOR PAIN 60 tablet 0  . cycloSPORINE (RESTASIS) 0.05 % ophthalmic emulsion Place 1 drop into both eyes 2 (two) times daily.    . diclofenac sodium (VOLTAREN) 1 % GEL APPLY 2 GRAMS EXTERNALLY TO THE AFFECTED AREA FOUR TIMES DAILY (Patient taking differently: Apply 2 g topically 4 (four) times daily as needed (pain/ shoulder & right theigh). ) 100 g 0  . diltiazem (CARDIZEM CD) 120 MG 24 hr capsule Take 1 capsule (120 mg total) by mouth daily. Please keep upcoming appt in January before anymore refills. Final Attempt (Patient taking differently: Take 120 mg by mouth at bedtime. Please keep upcoming appt in January before anymore refills. Final Attempt) 90 capsule 3  . doxycycline (VIBRA-TABS) 100 MG tablet Take 1 tablet (100 mg total) by mouth 2 (two) times daily. 14 tablet 0  . famotidine (PEPCID) 20 MG tablet Take 1 tablet (20 mg total) by mouth daily. 90 tablet 0  . flecainide (TAMBOCOR) 100 MG tablet  TAKE 1 TABLET BY MOUTH 2 TIMES A DAY. PLEASE KEEP UPCOMING APPOINTMENT IN JANUARY BEFORE ANYMORE REFILLS. 60 tablet 6  . fluticasone (FLONASE) 50 MCG/ACT nasal spray Place 2 sprays into both nostrils daily.     . hydrocortisone (ANUSOL-HC) 25 MG suppository Place 1 suppository (25 mg total) rectally 2 (two) times daily. 12 suppository 0  . hydrOXYzine (VISTARIL) 25 MG capsule Take 25 mg by mouth 2 (two) times daily as needed for anxiety.    Marland Kitchen loratadine (CLARITIN) 10 MG tablet TAKE 1 TABLET(10 MG) BY MOUTH DAILY (Patient taking differently: Take 10 mg by mouth daily. ) 90 tablet 0  . metoprolol tartrate (LOPRESSOR) 25 MG tablet TAKE 1 TABLET BY MOUTH TWICE DAILY (Patient taking differently: Take 12.5 mg by mouth 2 (two) times daily. ) 120 tablet 0  . montelukast (SINGULAIR) 10 MG tablet Take 1 tablet (10 mg total) by mouth at bedtime. 30 tablet 5  . omeprazole (PRILOSEC) 20 MG capsule Take 1 capsule (  20 mg total) by mouth daily. 30 capsule 5  . OXYGEN 2L at bedtime and prn exertion    . predniSONE (DELTASONE) 10 MG tablet Take 5 tabs po daily x 3 days; then 4 tabs daily x3 days; then 3 tabs daily x3 days; then 2 tab daily x 3 days 42 tablet 0  . venlafaxine XR (EFFEXOR-XR) 150 MG 24 hr capsule Take 187.5 mg by mouth daily with breakfast. Take 150mg  capsule with 37.5mg  tablet    . venlafaxine (EFFEXOR) 37.5 MG tablet Take 187.5 mg by mouth daily. Take 37.5mg  tablet with 150mg     . furosemide (LASIX) 40 MG tablet Take 1 tablet (40 mg total) by mouth daily. 30 tablet 0  . potassium chloride SA (K-DUR) 20 MEQ tablet Take 1 tablet (20 mEq total) by mouth daily. 30 tablet 0  . umeclidinium bromide (INCRUSE ELLIPTA) 62.5 MCG/INH AEPB Inhale 1 puff into the lungs daily. (Patient not taking: Reported on 05/13/2019) 7 each 0   No facility-administered medications prior to visit.    Review of Systems  Review of Systems  Respiratory: Negative for cough, shortness of breath and wheezing.    Physical Exam   BP 138/68 (BP Location: Left Arm, Patient Position: Sitting, Cuff Size: Normal) Comment (BP Location): forearm  Pulse 98   Temp (!) 97.2 F (36.2 C)   Ht 5\' 5"  (1.651 m)   Wt (!) 360 lb (163.3 kg)   SpO2 99% Comment: on 2L continuous  BMI 59.91 kg/m  Physical Exam Constitutional:      General: She is not in acute distress.    Appearance: Normal appearance. She is obese. She is not ill-appearing.  HENT:     Head: Normocephalic and atraumatic.  Cardiovascular:     Rate and Rhythm: Normal rate and regular rhythm.  Pulmonary:     Effort: Pulmonary effort is normal.     Breath sounds: No wheezing.     Comments: No wheezing, able to take a full deep breath Musculoskeletal:     Comments: In WC  Skin:    General: Skin is warm and dry.  Neurological:     General: No focal deficit present.     Mental Status: She is alert and oriented to person, place, and time. Mental status is at baseline.  Psychiatric:        Mood and Affect: Mood normal.        Behavior: Behavior normal.        Thought Content: Thought content normal.        Judgment: Judgment normal.      Lab Results:  CBC    Component Value Date/Time   WBC 14.4 (H) 03/19/2019 0417   RBC 3.90 03/19/2019 0417   HGB 10.5 (L) 03/19/2019 0417   HGB 12.9 11/27/2017 1458   HCT 35.6 (L) 03/19/2019 0417   HCT 37.6 11/27/2017 1458   PLT 640 (H) 03/19/2019 0417   PLT 412 11/27/2017 1458   MCV 91.3 03/19/2019 0417   MCV 95 11/27/2017 1458   MCH 26.9 03/19/2019 0417   MCHC 29.5 (L) 03/19/2019 0417   RDW 19.6 (H) 03/19/2019 0417   RDW 15.5 (H) 11/27/2017 1458   LYMPHSABS 0.9 03/14/2019 0750   MONOABS 0.3 03/14/2019 0750   EOSABS 0.1 03/14/2019 0750   BASOSABS 0.0 03/14/2019 0750    BMET    Component Value Date/Time   NA 139 03/22/2019 0439   NA 141 11/27/2017 1458   NA 135 10/01/2014 1307  K 3.4 (L) 03/22/2019 0439   K 3.0 (L) 10/01/2014 1307   CL 98 03/22/2019 0439   CL 99 (L) 10/01/2014 1307   CO2 35 (H)  03/22/2019 0439   CO2 26 10/01/2014 1307   GLUCOSE 167 (H) 03/22/2019 0439   GLUCOSE 114 (H) 10/01/2014 1307   BUN 27 (H) 03/22/2019 0439   BUN 12 11/27/2017 1458   BUN 18 10/01/2014 1307   CREATININE 0.55 03/22/2019 0439   CREATININE 0.59 05/19/2015 1415   CALCIUM 8.1 (L) 03/22/2019 0439   CALCIUM 8.5 (L) 10/01/2014 1307   GFRNONAA >60 03/22/2019 0439   GFRNONAA >60 10/06/2014 0706   GFRAA >60 03/22/2019 0439   GFRAA >60 10/06/2014 0706    BNP    Component Value Date/Time   BNP 68.6 03/14/2019 0750    ProBNP    Component Value Date/Time   PROBNP 1,316.0 (H) 01/31/2014 0516    Imaging: No results found.   Assessment & Plan:   Bronchitis with acute wheezing - Significant improvement - Continue Doxycycline course and prednisone taper - RX dextromethorphan 72ml q4 hours prn cough   Vocal cord dysfunction - No obstruction or BD response on PFTs  - Stop Symbicort as it can worsen VCD, if breathing deteriorates ok to resume  - Continue max GERD and PND treatment  Reactive airway disease - Improved, no overt wheezing on exam - Continue prednisone taper as prescribed    Martyn Ehrich, NP 05/13/2019

## 2019-05-13 NOTE — Assessment & Plan Note (Signed)
-   Improved, no overt wheezing on exam - Continue prednisone taper as prescribed

## 2019-05-13 NOTE — Assessment & Plan Note (Signed)
-   Significant improvement - Continue Doxycycline course and prednisone taper - RX dextromethorphan 40ml q4 hours prn cough

## 2019-05-13 NOTE — Patient Instructions (Addendum)
Pleasure seeing you today [name] White, I am so glad that you are feeling better  Recommendations: Trial off Symbicort (this can irritate VCD, if breathing acutely worsens off you can resume) Continue prednisone taper until completed Continue Doxycycline until completed Continue both Flonase and Astelin nasal spray daily Continue omeprazole and pepcid daily   Rx: Dextromethorphan 10mg  q4 hours as needed for cough   Follow-up: 2-4 months with Dr. Lamonte Sakai

## 2019-05-16 DIAGNOSIS — I5032 Chronic diastolic (congestive) heart failure: Secondary | ICD-10-CM | POA: Diagnosis not present

## 2019-05-16 DIAGNOSIS — M6281 Muscle weakness (generalized): Secondary | ICD-10-CM | POA: Diagnosis not present

## 2019-05-16 DIAGNOSIS — R062 Wheezing: Secondary | ICD-10-CM | POA: Diagnosis not present

## 2019-05-16 DIAGNOSIS — J449 Chronic obstructive pulmonary disease, unspecified: Secondary | ICD-10-CM | POA: Diagnosis not present

## 2019-05-16 DIAGNOSIS — R05 Cough: Secondary | ICD-10-CM | POA: Diagnosis not present

## 2019-05-16 DIAGNOSIS — R0602 Shortness of breath: Secondary | ICD-10-CM | POA: Diagnosis not present

## 2019-05-16 DIAGNOSIS — J40 Bronchitis, not specified as acute or chronic: Secondary | ICD-10-CM | POA: Diagnosis not present

## 2019-05-16 DIAGNOSIS — F411 Generalized anxiety disorder: Secondary | ICD-10-CM | POA: Diagnosis not present

## 2019-05-28 ENCOUNTER — Other Ambulatory Visit: Payer: Self-pay

## 2019-05-28 ENCOUNTER — Inpatient Hospital Stay (HOSPITAL_COMMUNITY)
Admission: EM | Admit: 2019-05-28 | Discharge: 2019-06-02 | DRG: 291 | Disposition: A | Discharge status: Home Health | Payer: Medicare HMO | Attending: Family Medicine | Admitting: Family Medicine

## 2019-05-28 ENCOUNTER — Emergency Department (HOSPITAL_COMMUNITY): Payer: Medicare HMO

## 2019-05-28 ENCOUNTER — Encounter (HOSPITAL_COMMUNITY): Payer: Self-pay | Admitting: Emergency Medicine

## 2019-05-28 DIAGNOSIS — E662 Morbid (severe) obesity with alveolar hypoventilation: Secondary | ICD-10-CM | POA: Diagnosis not present

## 2019-05-28 DIAGNOSIS — Z6841 Body Mass Index (BMI) 40.0 and over, adult: Secondary | ICD-10-CM | POA: Diagnosis not present

## 2019-05-28 DIAGNOSIS — M170 Bilateral primary osteoarthritis of knee: Secondary | ICD-10-CM | POA: Diagnosis present

## 2019-05-28 DIAGNOSIS — I110 Hypertensive heart disease with heart failure: Secondary | ICD-10-CM | POA: Diagnosis not present

## 2019-05-28 DIAGNOSIS — E876 Hypokalemia: Secondary | ICD-10-CM | POA: Diagnosis present

## 2019-05-28 DIAGNOSIS — K219 Gastro-esophageal reflux disease without esophagitis: Secondary | ICD-10-CM | POA: Diagnosis present

## 2019-05-28 DIAGNOSIS — Z7951 Long term (current) use of inhaled steroids: Secondary | ICD-10-CM

## 2019-05-28 DIAGNOSIS — E859 Amyloidosis, unspecified: Secondary | ICD-10-CM | POA: Diagnosis present

## 2019-05-28 DIAGNOSIS — I509 Heart failure, unspecified: Secondary | ICD-10-CM | POA: Diagnosis not present

## 2019-05-28 DIAGNOSIS — R7303 Prediabetes: Secondary | ICD-10-CM | POA: Diagnosis present

## 2019-05-28 DIAGNOSIS — Z881 Allergy status to other antibiotic agents status: Secondary | ICD-10-CM

## 2019-05-28 DIAGNOSIS — I480 Paroxysmal atrial fibrillation: Secondary | ICD-10-CM

## 2019-05-28 DIAGNOSIS — Z87891 Personal history of nicotine dependence: Secondary | ICD-10-CM

## 2019-05-28 DIAGNOSIS — Z9981 Dependence on supplemental oxygen: Secondary | ICD-10-CM | POA: Diagnosis not present

## 2019-05-28 DIAGNOSIS — Z885 Allergy status to narcotic agent status: Secondary | ICD-10-CM

## 2019-05-28 DIAGNOSIS — Z8249 Family history of ischemic heart disease and other diseases of the circulatory system: Secondary | ICD-10-CM

## 2019-05-28 DIAGNOSIS — Z20828 Contact with and (suspected) exposure to other viral communicable diseases: Secondary | ICD-10-CM | POA: Diagnosis present

## 2019-05-28 DIAGNOSIS — D6859 Other primary thrombophilia: Secondary | ICD-10-CM | POA: Diagnosis present

## 2019-05-28 DIAGNOSIS — J449 Chronic obstructive pulmonary disease, unspecified: Secondary | ICD-10-CM | POA: Diagnosis not present

## 2019-05-28 DIAGNOSIS — F329 Major depressive disorder, single episode, unspecified: Secondary | ICD-10-CM | POA: Diagnosis present

## 2019-05-28 DIAGNOSIS — J9601 Acute respiratory failure with hypoxia: Secondary | ICD-10-CM | POA: Diagnosis not present

## 2019-05-28 DIAGNOSIS — G2581 Restless legs syndrome: Secondary | ICD-10-CM | POA: Diagnosis present

## 2019-05-28 DIAGNOSIS — R0602 Shortness of breath: Secondary | ICD-10-CM

## 2019-05-28 DIAGNOSIS — R062 Wheezing: Secondary | ICD-10-CM | POA: Diagnosis not present

## 2019-05-28 DIAGNOSIS — J9621 Acute and chronic respiratory failure with hypoxia: Secondary | ICD-10-CM | POA: Diagnosis not present

## 2019-05-28 DIAGNOSIS — Z825 Family history of asthma and other chronic lower respiratory diseases: Secondary | ICD-10-CM

## 2019-05-28 DIAGNOSIS — R0689 Other abnormalities of breathing: Secondary | ICD-10-CM | POA: Diagnosis not present

## 2019-05-28 DIAGNOSIS — I10 Essential (primary) hypertension: Secondary | ICD-10-CM | POA: Diagnosis not present

## 2019-05-28 DIAGNOSIS — Z9109 Other allergy status, other than to drugs and biological substances: Secondary | ICD-10-CM

## 2019-05-28 DIAGNOSIS — Z66 Do not resuscitate: Secondary | ICD-10-CM | POA: Diagnosis not present

## 2019-05-28 DIAGNOSIS — N3281 Overactive bladder: Secondary | ICD-10-CM | POA: Diagnosis present

## 2019-05-28 DIAGNOSIS — Z7901 Long term (current) use of anticoagulants: Secondary | ICD-10-CM

## 2019-05-28 DIAGNOSIS — Z888 Allergy status to other drugs, medicaments and biological substances status: Secondary | ICD-10-CM

## 2019-05-28 DIAGNOSIS — M545 Low back pain: Secondary | ICD-10-CM | POA: Diagnosis present

## 2019-05-28 DIAGNOSIS — Z86711 Personal history of pulmonary embolism: Secondary | ICD-10-CM

## 2019-05-28 DIAGNOSIS — J383 Other diseases of vocal cords: Secondary | ICD-10-CM | POA: Diagnosis not present

## 2019-05-28 DIAGNOSIS — N3941 Urge incontinence: Secondary | ICD-10-CM | POA: Diagnosis present

## 2019-05-28 DIAGNOSIS — G8929 Other chronic pain: Secondary | ICD-10-CM | POA: Diagnosis present

## 2019-05-28 DIAGNOSIS — Z86718 Personal history of other venous thrombosis and embolism: Secondary | ICD-10-CM

## 2019-05-28 DIAGNOSIS — Z90710 Acquired absence of both cervix and uterus: Secondary | ICD-10-CM

## 2019-05-28 DIAGNOSIS — J441 Chronic obstructive pulmonary disease with (acute) exacerbation: Secondary | ICD-10-CM

## 2019-05-28 DIAGNOSIS — I5033 Acute on chronic diastolic (congestive) heart failure: Secondary | ICD-10-CM | POA: Diagnosis present

## 2019-05-28 DIAGNOSIS — M1909 Primary osteoarthritis, other specified site: Secondary | ICD-10-CM | POA: Diagnosis present

## 2019-05-28 DIAGNOSIS — J80 Acute respiratory distress syndrome: Secondary | ICD-10-CM | POA: Diagnosis not present

## 2019-05-28 DIAGNOSIS — Z882 Allergy status to sulfonamides status: Secondary | ICD-10-CM

## 2019-05-28 DIAGNOSIS — Z79899 Other long term (current) drug therapy: Secondary | ICD-10-CM

## 2019-05-28 DIAGNOSIS — E0590 Thyrotoxicosis, unspecified without thyrotoxic crisis or storm: Secondary | ICD-10-CM | POA: Diagnosis present

## 2019-05-28 DIAGNOSIS — F419 Anxiety disorder, unspecified: Secondary | ICD-10-CM | POA: Diagnosis present

## 2019-05-28 DIAGNOSIS — G43909 Migraine, unspecified, not intractable, without status migrainosus: Secondary | ICD-10-CM | POA: Diagnosis present

## 2019-05-28 LAB — CBC WITH DIFFERENTIAL/PLATELET
Abs Immature Granulocytes: 0.09 10*3/uL — ABNORMAL HIGH (ref 0.00–0.07)
Basophils Absolute: 0.1 10*3/uL (ref 0.0–0.1)
Basophils Relative: 0 %
Eosinophils Absolute: 0.4 10*3/uL (ref 0.0–0.5)
Eosinophils Relative: 3 %
HCT: 38.4 % (ref 36.0–46.0)
Hemoglobin: 11.5 g/dL — ABNORMAL LOW (ref 12.0–15.0)
Immature Granulocytes: 1 %
Lymphocytes Relative: 22 %
Lymphs Abs: 2.6 10*3/uL (ref 0.7–4.0)
MCH: 27.1 pg (ref 26.0–34.0)
MCHC: 29.9 g/dL — ABNORMAL LOW (ref 30.0–36.0)
MCV: 90.6 fL (ref 80.0–100.0)
Monocytes Absolute: 0.7 10*3/uL (ref 0.1–1.0)
Monocytes Relative: 6 %
Neutro Abs: 8 10*3/uL — ABNORMAL HIGH (ref 1.7–7.7)
Neutrophils Relative %: 68 %
Platelets: 431 10*3/uL — ABNORMAL HIGH (ref 150–400)
RBC: 4.24 MIL/uL (ref 3.87–5.11)
RDW: 18.8 % — ABNORMAL HIGH (ref 11.5–15.5)
WBC: 11.8 10*3/uL — ABNORMAL HIGH (ref 4.0–10.5)
nRBC: 0 % (ref 0.0–0.2)

## 2019-05-28 LAB — COMPREHENSIVE METABOLIC PANEL
ALT: 29 U/L (ref 0–44)
AST: 20 U/L (ref 15–41)
Albumin: 3 g/dL — ABNORMAL LOW (ref 3.5–5.0)
Alkaline Phosphatase: 84 U/L (ref 38–126)
Anion gap: 12 (ref 5–15)
BUN: 17 mg/dL (ref 8–23)
CO2: 30 mmol/L (ref 22–32)
Calcium: 9 mg/dL (ref 8.9–10.3)
Chloride: 100 mmol/L (ref 98–111)
Creatinine, Ser: 0.6 mg/dL (ref 0.44–1.00)
GFR calc Af Amer: >60 mL/min (ref >60)
GFR calc non Af Amer: >60 mL/min (ref >60)
Glucose, Bld: 125 mg/dL — ABNORMAL HIGH (ref 70–99)
Potassium: 3.6 mmol/L (ref 3.5–5.1)
Sodium: 142 mmol/L (ref 135–145)
Total Bilirubin: 0.5 mg/dL (ref 0.3–1.2)
Total Protein: 7.4 g/dL (ref 6.5–8.1)

## 2019-05-28 LAB — POC SARS CORONAVIRUS 2 AG -  ED: SARS Coronavirus 2 Ag: NEGATIVE

## 2019-05-28 LAB — TROPONIN I (HIGH SENSITIVITY): Troponin I (High Sensitivity): 11 ng/L (ref <18)

## 2019-05-28 LAB — GLUCOSE, CAPILLARY: Glucose-Capillary: 158 mg/dL — ABNORMAL HIGH (ref 70–99)

## 2019-05-28 LAB — BRAIN NATRIURETIC PEPTIDE: B Natriuretic Peptide: 24.3 pg/mL (ref 0.0–100.0)

## 2019-05-28 LAB — SARS CORONAVIRUS 2 (TAT 6-24 HRS): SARS Coronavirus 2: NEGATIVE

## 2019-05-28 MED ORDER — HYDROXYZINE HCL 25 MG PO TABS
25.0000 mg | ORAL_TABLET | Freq: Two times a day (BID) | ORAL | Status: DC | PRN
  Administered 2019-05-28 – 2019-06-01 (×4): 25 mg via ORAL
  Filled 2019-05-28 (×4): qty 1

## 2019-05-28 MED ORDER — FUROSEMIDE 10 MG/ML IJ SOLN
80.0000 mg | Freq: Once | INTRAMUSCULAR | Status: AC
  Administered 2019-05-28: 80 mg via INTRAVENOUS
  Filled 2019-05-28: qty 8

## 2019-05-28 MED ORDER — FAMOTIDINE 20 MG PO TABS
20.0000 mg | ORAL_TABLET | Freq: Every day | ORAL | Status: DC
  Administered 2019-05-29 – 2019-06-02 (×5): 20 mg via ORAL
  Filled 2019-05-28 (×5): qty 1

## 2019-05-28 MED ORDER — IPRATROPIUM-ALBUTEROL 0.5-2.5 (3) MG/3ML IN SOLN: 3.0000 mL | RESPIRATORY_TRACT | Status: DC | PRN

## 2019-05-28 MED ORDER — ALBUTEROL (5 MG/ML) CONTINUOUS INHALATION SOLN
INHALATION_SOLUTION | RESPIRATORY_TRACT | Status: AC
  Filled 2019-05-28: qty 20

## 2019-05-28 MED ORDER — IPRATROPIUM-ALBUTEROL 0.5-2.5 (3) MG/3ML IN SOLN
3.0000 mL | RESPIRATORY_TRACT | Status: DC
  Administered 2019-05-28 – 2019-05-29 (×4): 3 mL via RESPIRATORY_TRACT
  Filled 2019-05-28 (×6): qty 3

## 2019-05-28 MED ORDER — FUROSEMIDE 10 MG/ML IJ SOLN: 80.0000 mg | Freq: Once | INTRAMUSCULAR | Status: DC

## 2019-05-28 MED ORDER — CYCLOSPORINE 0.05 % OP EMUL
1.0000 [drp] | Freq: Two times a day (BID) | OPHTHALMIC | Status: DC
  Administered 2019-05-29 – 2019-06-02 (×9): 1 [drp] via OPHTHALMIC
  Filled 2019-05-28 (×10): qty 30

## 2019-05-28 MED ORDER — IPRATROPIUM-ALBUTEROL 0.5-2.5 (3) MG/3ML IN SOLN: 3.0000 mL | Freq: Four times a day (QID) | RESPIRATORY_TRACT | Status: DC

## 2019-05-28 MED ORDER — KETOTIFEN FUMARATE 0.025 % OP SOLN
1.0000 [drp] | Freq: Two times a day (BID) | OPHTHALMIC | Status: DC | PRN
  Filled 2019-05-28: qty 5

## 2019-05-28 MED ORDER — IPRATROPIUM BROMIDE 0.02 % IN SOLN
RESPIRATORY_TRACT | Status: AC
  Filled 2019-05-28: qty 2.5

## 2019-05-28 MED ORDER — FLUTICASONE PROPIONATE 50 MCG/ACT NA SUSP
2.0000 | Freq: Every day | NASAL | Status: DC
  Administered 2019-05-29 – 2019-06-02 (×5): 2 via NASAL
  Filled 2019-05-28 (×2): qty 16

## 2019-05-28 MED ORDER — HYDROXYZINE PAMOATE 25 MG PO CAPS
25.0000 mg | ORAL_CAPSULE | Freq: Two times a day (BID) | ORAL | Status: DC | PRN
  Filled 2019-05-28 (×2): qty 1

## 2019-05-28 MED ORDER — AZELASTINE HCL 0.1 % NA SOLN
2.0000 | Freq: Two times a day (BID) | NASAL | Status: DC
  Administered 2019-05-29 – 2019-06-02 (×8): 2 via NASAL
  Filled 2019-05-28 (×2): qty 30

## 2019-05-28 MED ORDER — INSULIN ASPART 100 UNIT/ML ~~LOC~~ SOLN
0.0000 [IU] | Freq: Three times a day (TID) | SUBCUTANEOUS | Status: DC
  Administered 2019-05-29: 1 [IU] via SUBCUTANEOUS
  Administered 2019-05-30 – 2019-05-31 (×3): 3 [IU] via SUBCUTANEOUS
  Administered 2019-05-31: 2 [IU] via SUBCUTANEOUS
  Administered 2019-06-01: 3 [IU] via SUBCUTANEOUS
  Administered 2019-06-01 – 2019-06-02 (×2): 1 [IU] via SUBCUTANEOUS

## 2019-05-28 MED ORDER — MOMETASONE FURO-FORMOTEROL FUM 100-5 MCG/ACT IN AERO
2.0000 | INHALATION_SPRAY | Freq: Two times a day (BID) | RESPIRATORY_TRACT | Status: DC
  Administered 2019-05-29 – 2019-06-02 (×8): 2 via RESPIRATORY_TRACT
  Filled 2019-05-28: qty 8.8

## 2019-05-28 MED ORDER — APIXABAN 5 MG PO TABS
5.0000 mg | ORAL_TABLET | Freq: Two times a day (BID) | ORAL | Status: DC
  Administered 2019-05-28 – 2019-06-02 (×10): 5 mg via ORAL
  Filled 2019-05-28 (×10): qty 1

## 2019-05-28 MED ORDER — FLECAINIDE ACETATE 100 MG PO TABS
100.0000 mg | ORAL_TABLET | Freq: Two times a day (BID) | ORAL | Status: DC
  Administered 2019-05-28 – 2019-06-02 (×10): 100 mg via ORAL
  Filled 2019-05-28 (×11): qty 1

## 2019-05-28 MED ORDER — PANTOPRAZOLE SODIUM 40 MG PO TBEC
40.0000 mg | DELAYED_RELEASE_TABLET | Freq: Every day | ORAL | Status: DC
  Administered 2019-05-29 – 2019-06-02 (×5): 40 mg via ORAL
  Filled 2019-05-28 (×5): qty 1

## 2019-05-28 MED ORDER — MONTELUKAST SODIUM 10 MG PO TABS
10.0000 mg | ORAL_TABLET | Freq: Every day | ORAL | Status: DC
  Administered 2019-05-28 – 2019-06-01 (×5): 10 mg via ORAL
  Filled 2019-05-28 (×6): qty 1

## 2019-05-28 MED ORDER — POLYETHYLENE GLYCOL 3350 17 G PO PACK: 17.0000 g | PACK | Freq: Every day | ORAL | Status: DC | PRN

## 2019-05-28 MED ORDER — METOPROLOL TARTRATE 12.5 MG HALF TABLET
12.5000 mg | ORAL_TABLET | Freq: Two times a day (BID) | ORAL | Status: DC
  Administered 2019-05-28 – 2019-06-02 (×10): 12.5 mg via ORAL
  Filled 2019-05-28 (×10): qty 1

## 2019-05-28 MED ORDER — IPRATROPIUM BROMIDE 0.02 % IN SOLN
1.0000 mg | Freq: Once | RESPIRATORY_TRACT | Status: AC
  Administered 2019-05-28: 1 mg via RESPIRATORY_TRACT

## 2019-05-28 MED ORDER — VENLAFAXINE HCL ER 37.5 MG PO CP24
187.5000 mg | ORAL_CAPSULE | Freq: Every day | ORAL | Status: DC
  Administered 2019-05-29 – 2019-06-02 (×5): 187.5 mg via ORAL
  Filled 2019-05-28 (×8): qty 1

## 2019-05-28 MED ORDER — DILTIAZEM HCL ER COATED BEADS 120 MG PO CP24
120.0000 mg | ORAL_CAPSULE | Freq: Every day | ORAL | Status: DC
  Administered 2019-05-28 – 2019-06-01 (×5): 120 mg via ORAL
  Filled 2019-05-28 (×6): qty 1

## 2019-05-28 MED ORDER — LORATADINE 10 MG PO TABS
10.0000 mg | ORAL_TABLET | Freq: Every day | ORAL | Status: DC
  Administered 2019-05-29 – 2019-06-02 (×5): 10 mg via ORAL
  Filled 2019-05-28 (×5): qty 1

## 2019-05-28 MED ORDER — ACETAMINOPHEN 325 MG PO TABS
650.0000 mg | ORAL_TABLET | Freq: Four times a day (QID) | ORAL | Status: DC | PRN
  Administered 2019-05-29 – 2019-06-01 (×7): 650 mg via ORAL
  Filled 2019-05-28 (×7): qty 2

## 2019-05-28 MED ORDER — INSULIN ASPART 100 UNIT/ML ~~LOC~~ SOLN
0.0000 [IU] | Freq: Every day | SUBCUTANEOUS | Status: DC
  Administered 2019-06-01: 2 [IU] via SUBCUTANEOUS

## 2019-05-28 MED ORDER — ACETAMINOPHEN 650 MG RE SUPP: 650.0000 mg | Freq: Four times a day (QID) | RECTAL | Status: DC | PRN

## 2019-05-28 MED ORDER — METHYLPREDNISOLONE SODIUM SUCC 125 MG IJ SOLR
125.0000 mg | Freq: Once | INTRAMUSCULAR | Status: AC
  Administered 2019-05-28: 125 mg via INTRAVENOUS
  Filled 2019-05-28: qty 2

## 2019-05-28 MED ORDER — ALBUTEROL SULFATE HFA 108 (90 BASE) MCG/ACT IN AERS: 8.0000 | INHALATION_SPRAY | Freq: Once | RESPIRATORY_TRACT | Status: DC

## 2019-05-28 MED ORDER — ALBUTEROL (5 MG/ML) CONTINUOUS INHALATION SOLN
15.0000 mg/h | INHALATION_SOLUTION | RESPIRATORY_TRACT | Status: DC
  Administered 2019-05-28: 15 mg/h via RESPIRATORY_TRACT

## 2019-05-28 NOTE — ED Notes (Signed)
Ordered dinner tray.  

## 2019-05-28 NOTE — ED Notes (Signed)
Called to check on breakfast tray, tray not in route. Ordered a second tray for patient.

## 2019-05-28 NOTE — ED Notes (Signed)
Ordered diet tray for pt  

## 2019-05-28 NOTE — Progress Notes (Signed)
RT came to give duoneb treatment to patient. Before placing patient on treatment, Patient stated she had just used her albuterol rescue inhaler she brought from home. Patient stated she wants to wait before taking a neb treatment. RT did not give neb at this time. Patient's spo2 100%. Patient has expiratory wheeze but does not appear to be in respiratory distress at this time.  RN is aware. RT will monitor as needed.

## 2019-05-28 NOTE — H&P (Addendum)
[facility] Admission History and Physical Service Pager: [phone]  Patient name: [name] White Medical record number: [id number] Date of birth: 1956/01/13 Age: 63 y.o. Gender: female  Primary Care Provider: [name], MD Consultants: none Code Status: DNR/DNI (confirmed on admission) Preferred Emergency Contact: niece Geanie Berlin [phone]  Chief Complaint: Shortness of breath  Assessment and Plan: ARALIA CHARON is a 63 y.o. female presenting with shortness of breath. PMH is significant for HFpEF, COPD/asthma, obesity, OHS not on CPAP, PAF on Eliquis, HTN, MDD, vocal cord dysfunction, H/o VTE in 2015.  Acute on Chronic Respiratory Failure HFpEF, COPD/asthma, OHS not on CPAP Likely multifactorial with OHS, HFpEF, and COPD/asthma contributing.  Has been without home Lasix for the past 2 days since being discharged from rehab.  Also wheezing on exam with prolonged expiratory phase so could have asthma/COPD component contributing although unlikely related to volume overload given she recently completed doxycycline course and prednisone taper on 11/24 and is without sputum production.  Additionally, she is morbidly obese with OHS but is not currently on CPAP.  BNP 19, however with pitting edema up to knees with improvement in symptoms with IV Lasix, volume overload contributing. CXR in ED with moderate pulmonary vascular congestion and cardiomegaly.  Last echo 08/2018 with LVEF 55-60% with mildly elevated right ventricular pressure.  Received Solu-Medrol, duo nebs and 1 dose IV Lasix 80 mg with resultant output of 1 L and improvement in symptoms.  On exam able to talk in full sentences with appropriate saturations on home 2 L O2.  Has been compliant with home Eliquis without VTE symptoms, low likelihood of PE, Wells score 3 for tachycardia and prior PE. -Admit to MedSurg, attending Dr. Owens Shark - s/p IV Lasix 80 mg, will order additional dose for this  afternoon -Strict I's and O's -Echo -s/p Solu-Medrol -DuoNebs every 6 hours -CPAP nightly -PT OT -Continuous pulse ox  HTN Normotensive on admission, 115/80.  Reports compliance with diltiazem, metoprolol. -Continue home meds  PAF on Eliquis Prior VTE In sinus rhythm on exam.  Follows with Dr. Lovena Le.  On diltiazem and metoprolol for rate control.  Slightly tachycardic in ED to low 100s, likely 2/2 CAT.  Reports compliance with Eliquis.  Noted to have prior PE in 2015, previously on Xarelto but was switched to Eliquis at hospitalization 02/2019. -Continue home Eliquis  MDD On effexor but has been out for a few days. Hydroxyzine prn has not improved symptoms.  Currently, mood and affect euthymic without SI. -Continue home meds  Prior GI bleed Hospitalized 03/05/19-03/07/2019 likely secondary to diverticular bleed as well as abdominal hematoma sustained s/p fall.  Hemoglobin stable today. -Monitor hemoglobin  Prediabetes Last A1c 6.3 03/19/2019, currently not on any chronic antidiabetic medication. -Consider adding Metformin in the outpatient setting.  Vocal cord dysfunction Has previously worked with [facility] with improvement  Prior homelessness Noted per chart review.  Currently living with her niece.  FEN/GI: Heart healthy Prophylaxis: Home Eliquis  Disposition: Admit to MedSurg, attending Dr. Owens Shark  History of Present Illness:  [name] White is a 63 y.o. female presenting with shortness of breath since midnight last night.  States it is worse with laying down, and felt like she was drowning. Got dizzy with walking around.  She has been at [facility] rehab since last admission in September and was discharged on Sunday.  States she was not provided any refills of her chronic medications when she was discharged and has been out of her Lasix  and Effexor since Sunday.  She has been able to take her Eliquis and antihypertensive medications without any missed doses.  When she woke up this  morning, she gave herself 2 nebulizer treatments this morning but this did not help.  Prior to coming home from rehab, she was using albuterol every other day at rehab.  Saw pulmonology on 11/12 for acute asthmatic bronchitis exacerbation and received Depo-Medrol IM at the office as well as outpatient doxycycline and prednisone taper, of which she has completed the course.  Last pulmonology appointment 11/16 with improvement.  She denies any known sick or Covid contacts, was tested once weekly at rehab, all with negative results.  States she normally can walk about 20 feet without getting out of breath on a good day.  She lives with her niece.  She normally ambulates with a walker.  Patient is feeling better after treatments in the ED but remains with cough and shortness of breath with talking.  Review Of Systems: Per HPI with the following additions:   Review of Systems  Constitutional: Negative for fever.  HENT: Positive for congestion. Negative for sore throat.   Eyes: Negative for blurred vision and double vision.  Respiratory: Positive for cough, shortness of breath and wheezing. Negative for sputum production.   Cardiovascular: Positive for chest pain, leg swelling and PND.  Gastrointestinal: Negative for abdominal pain, constipation, diarrhea, nausea and vomiting.  Genitourinary: Negative for dysuria.  Neurological: Positive for dizziness. Negative for headaches.    Patient Active Problem List   Diagnosis Date Noted  . Bronchitis with acute wheezing 05/09/2019  . Hyperglycemia 03/19/2019  . Acute on chronic respiratory failure with hypoxemia ([place]) 03/14/2019  . Acute on chronic heart failure with preserved ejection fraction ([place]) 03/14/2019  . Acquired thrombophilia ([place]) due to A-Fib 03/07/2019  . BRBPR (bright red blood per rectum) 03/06/2019  . Heart failure with preserved ejection fraction ([place]) 03/06/2019  . Active bleeding 03/06/2019  . Obesity hypoventilation syndrome ([place])  03/06/2019  . Abdominal wall hematoma 03/06/2019  . Lower GI bleed   . High risk social situation   . Urge incontinence 01/27/2019  . Homelessness 12/30/2018  . Reactive airway disease 09/10/2018  . COPD with acute exacerbation ([place]) 08/07/2018  . Sleep apnea 07/05/2018  . Chronic respiratory failure with hypoxia ([place]) 07/05/2018  . Hematoma 11/27/2017  . Severe episode of recurrent major depressive disorder, without psychotic features ([place])   . MDD (major depressive disorder), recurrent episode, severe ([place]) 09/07/2015  . Atrial flutter ([place]) 07/29/2015  . Asthma 11/12/2014  . History of hypertension 11/05/2014  . Paroxysmal atrial fibrillation ([place]) 11/03/2014  . Chronic anticoagulation 11/03/2014  . Hypokalemia 11/03/2014  . Major depressive disorder 11/03/2014  . History of pulmonary embolus (PE) 11/03/2014  . MDD (major depressive disorder), recurrent severe, without psychosis ([place]) 03/05/2014  . Vocal cord dysfunction 11/12/2013  . Mixed headache 01/06/2012  . Hot flashes 04/22/2011  . OVERACTIVE BLADDER 04/18/2008  . Osteoarthritis of both knees 04/18/2008  . Osteoarthritis involving multiple joints on both sides of body 09/14/2007  . Morbid obesity ([place]) 08/24/2006  . RESTLESS LEGS SYNDROME 08/24/2006  . HYPERTENSION, BENIGN SYSTEMIC 08/24/2006  . RHINITIS, ALLERGIC 08/24/2006  . GASTROESOPHAGEAL REFLUX, NO ESOPHAGITIS 08/24/2006    Past Medical History: Past Medical History:  Diagnosis Date  . Abdominal wall hematoma 03/06/2019  . Acute bronchitis 08/29/2018  . Acute on chronic respiratory failure with hypoxia ([place]) 08/29/2018  . Anxiety   . Asthma   . Chronic  lower back pain   . COPD (chronic obstructive pulmonary disease) ([place])   . Depression   . Exposure to COVID-19 virus 11/02/2018  . Fall 02/2019  . Family history of anesthesia complication    "daughter; causes her to pass out afterwards"  . GERD (gastroesophageal reflux disease)   . History of atrial flutter  06/26/2015  . History of pulmonary embolus (PE) 11/03/2014  . Hypertension   . Hyperthyroidism   . Left medial tibial stress syndrome 12/26/2017  . Lower GI bleed   . Migraine    "monthly" (12/28/2013)  . Non-traumatic rhabdomyolysis 11/03/2014  . Obesity hypoventilation syndrome ([place]) 03/06/2019  . Osteoarthritis    "both knees; back of my neck; right pelvic bone" (12/28/2013)  . Paroxysmal A-fib ([place])   . Pulmonary embolism ([place]) 12/28/2013   "2 clots in each lung"    Past Surgical History: Past Surgical History:  Procedure Laterality Date  . ABDOMINAL HYSTERECTOMY    . APPENDECTOMY    . BREAST CYST EXCISION Right   . DILATION AND CURETTAGE OF UTERUS    . ELECTROPHYSIOLOGIC STUDY N/A 05/27/2015   Procedure: A-Flutter;  Surgeon: Evans Lance, MD;  Location: [facility];  Service: Cardiovascular;  Laterality: N/A;  . EXCISIONAL HEMORRHOIDECTOMY    . NASAL SINUS SURGERY  2007  . THYROIDECTOMY, PARTIAL Right 2005  . TUBAL LIGATION    . WISDOM TOOTH EXTRACTION      Social History: Social History   Tobacco Use  . Smoking status: Former Smoker    Packs/day: 0.25    Years: 20.00    Pack years: 5.00    Types: Cigarettes    Quit date: 07/17/1999    Years since quitting: 19.8  . Smokeless tobacco: Never Used  Substance Use Topics  . Alcohol use: Not Currently    Comment: "drank some in my 30's"  . Drug use: No   Additional social history: Lives with niece, former smoker Please also refer to relevant sections of EMR.  Family History: Family History  Problem Relation Age of Onset  . Osteoarthritis Mother   . Asthma Mother   . Heart failure Mother   . Breast cancer Daughter     Allergies and Medications: Allergies  Allergen Reactions  . Caffeine Other (See Comments)    Migraine  . Hydralazine Hcl     Other reaction(s): Other (See Comments) AKI leading to rhabdo and electrolyte abnormalities  . Hydrocodone Nausea And Vomiting    Headache  . Ciprofloxacin Hives and  Rash  . Erythromycin Hives and Rash  . Lisinopril Cough  . Oxycodone Nausea And Vomiting    Headache  . Sulfamethoxazole-Trimethoprim Rash   No current facility-administered medications on file prior to encounter.    Current Outpatient Medications on File Prior to Encounter  Medication Sig Dispense Refill  . acetaminophen (TYLENOL) 325 MG tablet Take 2 tablets (650 mg total) by mouth every 6 (six) hours as needed for mild pain.    Marland Kitchen albuterol (PROVENTIL) (2.5 MG/3ML) 0.083% nebulizer solution Take 3 mLs (2.5 mg total) by nebulization every 6 (six) hours as needed for wheezing or shortness of breath. 75 mL 4  . albuterol (VENTOLIN HFA) 108 (90 Base) MCG/ACT inhaler Inhale 1-2 puffs into the lungs every 6 (six) hours as needed for wheezing or shortness of breath.    Marland Kitchen apixaban (ELIQUIS) 5 MG TABS tablet Take 1 tablet (5 mg total) by mouth 2 (two) times daily. 60 tablet 0  . azelastine (ASTELIN)  0.1 % nasal spray Place 2 sprays into both nostrils 2 (two) times daily. Use in each nostril as directed 30 mL 1  . azelastine (OPTIVAR) 0.05 % ophthalmic solution Place 1 drop into both eyes 2 (two) times daily as needed (itchy eyes).    . benzonatate (TESSALON) 200 MG capsule Take 1 capsule (200 mg total) by mouth 3 (three) times daily as needed for cough. 30 capsule 1  . budesonide-formoterol (SYMBICORT) 80-4.5 MCG/ACT inhaler Inhale 2 puffs into the lungs 2 (two) times daily.    . cyclobenzaprine (FLEXERIL) 10 MG tablet TAKE 1 TABLET BY MOUTH EVERY 8 HOURS AS NEEDED FOR PAIN 60 tablet 0  . cycloSPORINE (RESTASIS) 0.05 % ophthalmic emulsion Place 1 drop into both eyes 2 (two) times daily.    Marland Kitchen dextromethorphan 15 MG/5ML syrup Take 10 mLs (30 mg total) by mouth 4 (four) times daily as needed for cough. 240 mL 1  . diclofenac sodium (VOLTAREN) 1 % GEL APPLY 2 GRAMS EXTERNALLY TO THE AFFECTED AREA FOUR TIMES DAILY (Patient taking differently: Apply 2 g topically 4 (four) times daily as needed (pain/  shoulder & right theigh). ) 100 g 0  . diltiazem (CARDIZEM CD) 120 MG 24 hr capsule Take 1 capsule (120 mg total) by mouth daily. Please keep upcoming appt in January before anymore refills. Final Attempt (Patient taking differently: Take 120 mg by mouth at bedtime. Please keep upcoming appt in January before anymore refills. Final Attempt) 90 capsule 3  . doxycycline (VIBRA-TABS) 100 MG tablet Take 1 tablet (100 mg total) by mouth 2 (two) times daily. 14 tablet 0  . famotidine (PEPCID) 20 MG tablet Take 1 tablet (20 mg total) by mouth daily. 90 tablet 0  . flecainide (TAMBOCOR) 100 MG tablet TAKE 1 TABLET BY MOUTH 2 TIMES A DAY. PLEASE KEEP UPCOMING APPOINTMENT IN JANUARY BEFORE ANYMORE REFILLS. 60 tablet 6  . fluticasone (FLONASE) 50 MCG/ACT nasal spray Place 2 sprays into both nostrils daily.     . furosemide (LASIX) 40 MG tablet Take 1 tablet (40 mg total) by mouth daily. 30 tablet 0  . hydrocortisone (ANUSOL-HC) 25 MG suppository Place 1 suppository (25 mg total) rectally 2 (two) times daily. 12 suppository 0  . hydrOXYzine (VISTARIL) 25 MG capsule Take 25 mg by mouth 2 (two) times daily as needed for anxiety.    Marland Kitchen loratadine (CLARITIN) 10 MG tablet TAKE 1 TABLET(10 MG) BY MOUTH DAILY (Patient taking differently: Take 10 mg by mouth daily. ) 90 tablet 0  . metoprolol tartrate (LOPRESSOR) 25 MG tablet TAKE 1 TABLET BY MOUTH TWICE DAILY (Patient taking differently: Take 12.5 mg by mouth 2 (two) times daily. ) 120 tablet 0  . montelukast (SINGULAIR) 10 MG tablet Take 1 tablet (10 mg total) by mouth at bedtime. 30 tablet 5  . omeprazole (PRILOSEC) 20 MG capsule Take 1 capsule (20 mg total) by mouth daily. 30 capsule 5  . OXYGEN 2L at bedtime and prn exertion    . potassium chloride SA (K-DUR) 20 MEQ tablet Take 1 tablet (20 mEq total) by mouth daily. 30 tablet 0  . predniSONE (DELTASONE) 10 MG tablet Take 5 tabs po daily x 3 days; then 4 tabs daily x3 days; then 3 tabs daily x3 days; then 2 tab daily  x 3 days 42 tablet 0  . venlafaxine XR (EFFEXOR-XR) 150 MG 24 hr capsule Take 187.5 mg by mouth daily with breakfast. Take 150mg  capsule with 37.5mg  tablet  Objective: BP (!) 146/90   Pulse 97   Temp (!) 97.2 F (36.2 C)   Resp 19   Ht 5\' 8"  (1.727 m)   Wt (!) 163.3 kg   SpO2 96%   BMI 54.74 kg/m  Exam: General: Pleasant, obese, elderly female lying in bed.  In no acute distress Eyes: EOMI ENTM: wearing mask.  Neck: large, not able to appreciate JVD Cardiovascular: tachycardic, regular rhythm, no murmurs.  2+ pitting edema to knees. Respiratory: Expiratory wheezing with prolonged expiratory phase, wheezing throughout.  Able to speak in full sentences.  Appropriately saturated on home oxygen, 2 L. Gastrointestinal: Soft, obese abdomen, bowel sounds normal.  No tenderness to palpation. MSK: Normal tone Derm: No appreciable rashes or lesions Neuro: Alert and oriented, speech normal Psych: No flight of ideas or tangential thought process.  Mood and affect euthymic.  Labs and Imaging: CBC BMET  Recent Labs  Lab 05/28/19 0646  WBC 11.8*  HGB 11.5*  HCT 38.4  PLT 431*   Recent Labs  Lab 05/28/19 0646  NA 142  K 3.6  CL 100  CO2 30  BUN 17  CREATININE 0.60  GLUCOSE 125*  CALCIUM 9.0     EKG: sinus rhythm  Dg Chest Portable 1 View  Result Date: 05/28/2019 CLINICAL DATA:  Shortness of breath. COPD. Evaluate for pulmonary edema. EXAM: PORTABLE CHEST 1 VIEW COMPARISON:  One-view chest x-ray 03/17/2019 FINDINGS: The heart is enlarged. Chronic elevation of the right hemidiaphragm is again noted. Atherosclerotic changes are present at the aortic arch. Moderate pulmonary vascular congestion is present without frank edema. No focal airspace consolidation is present. IMPRESSION: 1. Stable cardiomegaly and moderate pulmonary vascular congestion without frank edema. 2. No focal airspace consolidation. 3. Stable chronic elevation of the right hemidiaphragm. Electronically Signed    By: San Morelle M.D.   On: 05/28/2019 06:40   Rory Percy, DO 05/28/2019, 10:08 AM PGY-3, [facility] pager: [phone], text pages welcome

## 2019-05-28 NOTE — ED Notes (Signed)
purewick has been placed and hooked to suction canister.

## 2019-05-28 NOTE — ED Provider Notes (Signed)
  Physical Exam  BP 135/79   Pulse 88   Temp (!) 97.2 F (36.2 C)   Resp (!) 21   Ht 5\' 8"  (1.727 m)   Wt (!) 163.3 kg   SpO2 100%   BMI 54.74 kg/m   Physical Exam  ED Course/Procedures     Procedures  MDM  Received patient in signout.  History of asthma COPD CHF and vocal cord dysfunction.  Very dyspneic upon arrival and was hypoxic.  Feeling somewhat better now after treatments.  No relief with her breathing treatments at home.  Likely multifactorial.  With the mild dyspnea patient will likely require admission the hospital.  Patient feeling somewhat better.  However with sleep her sats did go down to the 50s.  Woke up patient and sats normalized on her oxygen.  Primary care doctor is Dr. Kris Mouton from [facility].  Will discuss with them about admission.        Davonna Belling, MD 05/28/19 [phone]

## 2019-05-28 NOTE — ED Triage Notes (Signed)
BIB [facility] from home with c/o of respiratory distress worsening about 1 hr ago. Hx of Asthma, COPD, and CHF. Audible wheezing heard on arrival to ED. Received .3 IM Epi PTA.

## 2019-05-28 NOTE — ED Notes (Signed)
Ordered lunch tray 

## 2019-05-28 NOTE — ED Provider Notes (Signed)
Emergency Department Provider Note   I have reviewed the triage vital signs and the nursing notes.   HISTORY  Chief Complaint Respiratory Distress   HPI [name] White is a 63 y.o. female with below medical problems who presents the emergency department today secondary to shortness of breath, wheezing.  Patient states that she was just recently in rehab for her breathing problems she was discharged a couple days ago and they did not send her home on any medications and thus she has not had those in 2 days.  She states she woke up tonight around midnight in acute respiratory distress.  Significant dyspnea, wheezing.  EMS was called on fire arrival he put her directly on oxygen prior to vital signs.  She is placement 2 L at baseline.  On EMS arrival she was at 100% tachypneic with auditory wheezing.  They gave 0.3 mg of epinephrine started IV and brought here without further intervention.  Patient states she has worsening lower extremity edema.  She also states that she has a history of COPD/asthma.  Review the records it sounds like at baseline she does not have stridor or wheezing.  She is on long-acting and rescue albuterol.  She denies chest pain with this.  She is coughing but no fevers.  She states she has been tested for Covid 10 times is always been negative.   No other associated or modifying symptoms.    Past Medical History:  Diagnosis Date  . Abdominal wall hematoma 03/06/2019  . Acute bronchitis 08/29/2018  . Acute on chronic respiratory failure with hypoxia ([place]) 08/29/2018  . Anxiety   . Asthma   . Chronic lower back pain   . COPD (chronic obstructive pulmonary disease) ([place])   . Depression   . Exposure to COVID-19 virus 11/02/2018  . Fall 02/2019  . Family history of anesthesia complication    "daughter; causes her to pass out afterwards"  . GERD (gastroesophageal reflux disease)   . History of atrial flutter 06/26/2015  . History of pulmonary embolus (PE) 11/03/2014  .  Hypertension   . Hyperthyroidism   . Left medial tibial stress syndrome 12/26/2017  . Lower GI bleed   . Migraine    "monthly" (12/28/2013)  . Non-traumatic rhabdomyolysis 11/03/2014  . Obesity hypoventilation syndrome ([place]) 03/06/2019  . Osteoarthritis    "both knees; back of my neck; right pelvic bone" (12/28/2013)  . Paroxysmal A-fib ([place])   . Pulmonary embolism ([place]) 12/28/2013   "2 clots in each lung"    Patient Active Problem List   Diagnosis Date Noted  . Bronchitis with acute wheezing 05/09/2019  . Hyperglycemia 03/19/2019  . Acute on chronic respiratory failure with hypoxemia ([place]) 03/14/2019  . Acute on chronic heart failure with preserved ejection fraction ([place]) 03/14/2019  . Acquired thrombophilia ([place]) due to A-Fib 03/07/2019  . BRBPR (bright red blood per rectum) 03/06/2019  . Heart failure with preserved ejection fraction ([place]) 03/06/2019  . Active bleeding 03/06/2019  . Obesity hypoventilation syndrome ([place]) 03/06/2019  . Abdominal wall hematoma 03/06/2019  . Lower GI bleed   . High risk social situation   . Urge incontinence 01/27/2019  . Homelessness 12/30/2018  . Reactive airway disease 09/10/2018  . COPD with acute exacerbation ([place]) 08/07/2018  . Sleep apnea 07/05/2018  . Chronic respiratory failure with hypoxia ([place]) 07/05/2018  . Hematoma 11/27/2017  . Severe episode of recurrent major depressive disorder, without psychotic features ([place])   . MDD (major depressive disorder), recurrent episode,  severe ([place]) 09/07/2015  . Atrial flutter ([place]) 07/29/2015  . Asthma 11/12/2014  . History of hypertension 11/05/2014  . Paroxysmal atrial fibrillation ([place]) 11/03/2014  . Chronic anticoagulation 11/03/2014  . Hypokalemia 11/03/2014  . Major depressive disorder 11/03/2014  . History of pulmonary embolus (PE) 11/03/2014  . MDD (major depressive disorder), recurrent severe, without psychosis ([place]) 03/05/2014  . Vocal cord dysfunction 11/12/2013  . Mixed headache  01/06/2012  . Hot flashes 04/22/2011  . OVERACTIVE BLADDER 04/18/2008  . Osteoarthritis of both knees 04/18/2008  . Osteoarthritis involving multiple joints on both sides of body 09/14/2007  . Morbid obesity ([place]) 08/24/2006  . RESTLESS LEGS SYNDROME 08/24/2006  . HYPERTENSION, BENIGN SYSTEMIC 08/24/2006  . RHINITIS, ALLERGIC 08/24/2006  . GASTROESOPHAGEAL REFLUX, NO ESOPHAGITIS 08/24/2006    Past Surgical History:  Procedure Laterality Date  . ABDOMINAL HYSTERECTOMY    . APPENDECTOMY    . BREAST CYST EXCISION Right   . DILATION AND CURETTAGE OF UTERUS    . ELECTROPHYSIOLOGIC STUDY N/A 05/27/2015   Procedure: A-Flutter;  Surgeon: Evans Lance, MD;  Location: [facility];  Service: Cardiovascular;  Laterality: N/A;  . EXCISIONAL HEMORRHOIDECTOMY    . NASAL SINUS SURGERY  2007  . THYROIDECTOMY, PARTIAL Right 2005  . TUBAL LIGATION    . WISDOM TOOTH EXTRACTION      Current Outpatient Rx  . Order #: [id number] Class: OTC  . Order #: [id number] Class: Normal  . Order #: [id number] Class: Historical Med  . Order #: [id number] Class: Normal  . Order #: [id number] Class: Print  . Order #: [id number] Class: Historical Med  . Order #: [id number] Class: Print  . Order #: [id number] Class: Historical Med  . Order #: [id number] Class: Normal  . Order #: [id number] Class: Historical Med  . Order #: [id number] Class: Print  . Order #: [id number] Class: Normal  . Order #: [id number] Class: Normal  . Order #: [id number] Class: Print  . Order #: [id number] Class: Normal  . Order #: [id number] Class: Normal  . Order #: [id number] Class: Historical Med  . Order #: [id number] Class: Normal  . Order #: [id number] Class: No Print  . Order #: [id number] Class: Historical Med  . Order #: [id number] Class: Normal  . Order #: [id number] Class: Normal  . Order #: [id number] Class: Print  . Order #: [id number] Class: Print  . Order #: [id number] Class: Historical Med  . Order #: [id number] Class: Normal  . Order #: [id number] Class:  Print  . Order #: [id number] Class: Historical Med    Allergies Caffeine, Hydralazine hcl, Hydrocodone, Ciprofloxacin, Erythromycin, Lisinopril, Oxycodone, and Sulfamethoxazole-trimethoprim  Family History  Problem Relation Age of Onset  . Osteoarthritis Mother   . Asthma Mother   . Heart failure Mother   . Breast cancer Daughter     Social History Social History   Tobacco Use  . Smoking status: Former Smoker    Packs/day: 0.25    Years: 20.00    Pack years: 5.00    Types: Cigarettes    Quit date: 07/17/1999    Years since quitting: 19.8  . Smokeless tobacco: Never Used  Substance Use Topics  . Alcohol use: Not Currently    Comment: "drank some in my 30's"  . Drug use: No    Review of Systems  All other systems negative except as documented in the HPI. All pertinent positives and negatives as reviewed in the HPI. ____________________________________________   PHYSICAL EXAM:  VITAL SIGNS: ED Triage Vitals  Enc Vitals Group     BP 05/28/19 0604 107/60     Pulse Rate 05/28/19 0604 91  Resp 05/28/19 0604 (!) 22     Temp 05/28/19 0604 (!) 97.2 F (36.2 C)     Temp src --      SpO2 05/28/19 0600 100 %     Weight 05/28/19 0604 (!) 360 lb 0.2 oz (163.3 kg)     Height 05/28/19 0604 5\' 8"  (1.727 m)    Constitutional: Alert and oriented. Well appearing and in mild resp distress. Eyes: Conjunctivae are normal. PERRL. EOMI. Head: Atraumatic. Nose: No congestion/rhinnorhea. Mouth/Throat: Mucous membranes are moist.  Oropharynx non-erythematous. Neck: No stridor.  No meningeal signs.   Cardiovascular: Normal rate, regular rhythm. Good peripheral circulation. Grossly normal heart sounds.   Respiratory: tachypnea respiratory effort.  No retractions. Lungs diminished with wheezing, audible wheezing and expiratory stridor. Gastrointestinal: Soft and nontender. No distention.  Musculoskeletal: No lower extremity tenderness nor edema. No gross deformities of  extremities. Neurologic:  Normal speech and language. No gross focal neurologic deficits are appreciated.  Skin:  Skin is warm, dry and intact. No rash noted.  ____________________________________________   LABS (all labs ordered are listed, but only abnormal results are displayed)  Labs Reviewed  CBC WITH DIFFERENTIAL/PLATELET  COMPREHENSIVE METABOLIC PANEL  BRAIN NATRIURETIC PEPTIDE  POC SARS CORONAVIRUS 2 AG -  ED  TROPONIN I (HIGH SENSITIVITY)   ____________________________________________  EKG   EKG Interpretation  Date/Time:  Tuesday May 28 2019 06:23:10 EST Ventricular Rate:  88 PR Interval:    QRS Duration: 89 QT Interval:  379 QTC Calculation: 459 R Axis:   48 Text Interpretation: Sinus rhythm Low voltage, precordial leads No significant change since last tracing Confirmed by Merrily Pew [phone]) on 05/28/2019 6:34:05 AM       ____________________________________________  RADIOLOGY  No results found.  ____________________________________________   PROCEDURES  Procedure(s) performed:   Procedures  CRITICAL CARE Performed by: Merrily Pew Total critical care time: 35 minutes Critical care time was exclusive of separately billable procedures and treating other patients. Critical care was necessary to treat or prevent imminent or life-threatening deterioration. Critical care was time spent personally by me on the following activities: development of treatment plan with patient and/or surrogate as well as nursing, discussions with consultants, evaluation of patient's response to treatment, examination of patient, obtaining history from patient or surrogate, ordering and performing treatments and interventions, ordering and review of laboratory studies, ordering and review of radiographic studies, pulse oximetry and re-evaluation of patient's condition.  ____________________________________________   INITIAL IMPRESSION / ASSESSMENT AND PLAN / ED  COURSE  Patient does state that she has worsening lower extremity Mody do not really appreciate on exam.  She does not have crackles.  Her vital signs are not really consistent with acute decompensated pulmonary edema.  She does not have any obvious JVD.  Not really able to listen for an S3 secondary to how loud she is breathing.  Could be COPD as well.  We will go and initiate treatment for COPD with all of the wheezing and that being her main underlying pulmonary issue.  May need to amend approach based on chest x-ray and labs.  No indication for BiPAP or intubation at this time.  Patient with improvement here. Imaging c/w some fluid overload so added lasix on. Still pending labs and further workup/reassessment prior to admission, care transferred pending same.   Pertinent labs & imaging results that were available during my care of the patient were reviewed by me and considered in my medical decision making (see chart for details).  ____________________________________________  FINAL CLINICAL IMPRESSION(S) / ED DIAGNOSES  Final diagnoses:  None     MEDICATIONS GIVEN DURING THIS VISIT:  Medications  albuterol (PROVENTIL,VENTOLIN) solution continuous neb (15 mg/hr Nebulization New Bag/Given 05/28/19 0617)  ipratropium (ATROVENT) nebulizer solution 1 mg (has no administration in time range)  methylPREDNISolone sodium succinate (SOLU-MEDROL) 125 mg/2 mL injection 125 mg (has no administration in time range)  ipratropium (ATROVENT) 0.02 % nebulizer solution (has no administration in time range)  albuterol (VENTOLIN) (5 MG/ML) 0.5% continuous inhalation solution (  Return to [facility] 05/28/19 0617)     NEW OUTPATIENT MEDICATIONS STARTED DURING THIS VISIT:  New Prescriptions   No medications on file    Note:  This note was prepared with assistance of Dragon voice recognition software. Occasional wrong-word or sound-a-like substitutions may have occurred due to the inherent limitations of  voice recognition software.   Cali Hope, Corene Cornea, MD 05/30/19 [phone]

## 2019-05-29 ENCOUNTER — Encounter (HOSPITAL_COMMUNITY): Payer: Self-pay | Admitting: General Practice

## 2019-05-29 ENCOUNTER — Telehealth (INDEPENDENT_AMBULATORY_CARE_PROVIDER_SITE_OTHER): Payer: Medicare HMO | Admitting: Family Medicine

## 2019-05-29 ENCOUNTER — Inpatient Hospital Stay (HOSPITAL_COMMUNITY): Payer: Medicare HMO

## 2019-05-29 ENCOUNTER — Other Ambulatory Visit: Payer: Self-pay

## 2019-05-29 DIAGNOSIS — R0602 Shortness of breath: Secondary | ICD-10-CM

## 2019-05-29 LAB — GLUCOSE, CAPILLARY
Glucose-Capillary: 117 mg/dL — ABNORMAL HIGH (ref 70–99)
Glucose-Capillary: 128 mg/dL — ABNORMAL HIGH (ref 70–99)
Glucose-Capillary: 105 mg/dL — ABNORMAL HIGH (ref 70–99)
Glucose-Capillary: 186 mg/dL — ABNORMAL HIGH (ref 70–99)

## 2019-05-29 LAB — CBC WITH DIFFERENTIAL/PLATELET
Abs Immature Granulocytes: 0.13 10*3/uL — ABNORMAL HIGH (ref 0.00–0.07)
Basophils Absolute: 0 10*3/uL (ref 0.0–0.1)
Basophils Relative: 0 %
Eosinophils Absolute: 0 10*3/uL (ref 0.0–0.5)
Eosinophils Relative: 0 %
HCT: 36 % (ref 36.0–46.0)
Hemoglobin: 10.7 g/dL — ABNORMAL LOW (ref 12.0–15.0)
Immature Granulocytes: 1 %
Lymphocytes Relative: 8 %
Lymphs Abs: 1.3 10*3/uL (ref 0.7–4.0)
MCH: 27.1 pg (ref 26.0–34.0)
MCHC: 29.7 g/dL — ABNORMAL LOW (ref 30.0–36.0)
MCV: 91.1 fL (ref 80.0–100.0)
Monocytes Absolute: 1.1 10*3/uL — ABNORMAL HIGH (ref 0.1–1.0)
Monocytes Relative: 6 %
Neutro Abs: 15.1 10*3/uL — ABNORMAL HIGH (ref 1.7–7.7)
Neutrophils Relative %: 85 %
Platelets: 472 10*3/uL — ABNORMAL HIGH (ref 150–400)
RBC: 3.95 MIL/uL (ref 3.87–5.11)
RDW: 19.1 % — ABNORMAL HIGH (ref 11.5–15.5)
WBC: 17.7 10*3/uL — ABNORMAL HIGH (ref 4.0–10.5)
nRBC: 0 % (ref 0.0–0.2)

## 2019-05-29 LAB — BASIC METABOLIC PANEL
Anion gap: 15 (ref 5–15)
BUN: 17 mg/dL (ref 8–23)
CO2: 31 mmol/L (ref 22–32)
Calcium: 9 mg/dL (ref 8.9–10.3)
Chloride: 95 mmol/L — ABNORMAL LOW (ref 98–111)
Creatinine, Ser: 0.71 mg/dL (ref 0.44–1.00)
GFR calc Af Amer: >60 mL/min (ref >60)
GFR calc non Af Amer: >60 mL/min (ref >60)
Glucose, Bld: 166 mg/dL — ABNORMAL HIGH (ref 70–99)
Potassium: 3.3 mmol/L — ABNORMAL LOW (ref 3.5–5.1)
Sodium: 141 mmol/L (ref 135–145)

## 2019-05-29 LAB — ECHOCARDIOGRAM COMPLETE
Height: 68 in
Weight: 5760.18 oz

## 2019-05-29 MED ORDER — POTASSIUM CHLORIDE CRYS ER 20 MEQ PO TBCR
40.0000 meq | EXTENDED_RELEASE_TABLET | Freq: Once | ORAL | Status: AC
  Administered 2019-05-29: 40 meq via ORAL
  Filled 2019-05-29: qty 2

## 2019-05-29 MED ORDER — GUAIFENESIN-DM 100-10 MG/5ML PO SYRP
5.0000 mL | ORAL_SOLUTION | ORAL | Status: DC | PRN
  Administered 2019-05-29 – 2019-06-01 (×6): 5 mL via ORAL
  Filled 2019-05-29 (×6): qty 5

## 2019-05-29 MED ORDER — PREDNISONE 20 MG PO TABS
40.0000 mg | ORAL_TABLET | Freq: Every day | ORAL | Status: DC
  Administered 2019-05-29 – 2019-06-02 (×5): 40 mg via ORAL
  Filled 2019-05-29 (×5): qty 2

## 2019-05-29 MED ORDER — IPRATROPIUM-ALBUTEROL 0.5-2.5 (3) MG/3ML IN SOLN
3.0000 mL | Freq: Three times a day (TID) | RESPIRATORY_TRACT | Status: DC
  Administered 2019-05-29 – 2019-06-02 (×11): 3 mL via RESPIRATORY_TRACT
  Filled 2019-05-29 (×11): qty 3

## 2019-05-29 MED ORDER — FUROSEMIDE 10 MG/ML IJ SOLN
80.0000 mg | Freq: Once | INTRAMUSCULAR | Status: AC
  Administered 2019-05-29: 80 mg via INTRAVENOUS
  Filled 2019-05-29: qty 8

## 2019-05-29 NOTE — Progress Notes (Signed)
Family Medicine Teaching Service Daily Progress Note Intern Pager: [phone]  Patient name: [name] White Medical record number: [id number] Date of birth: October 04, 1955 Age: 63 y.o. Gender: female  Primary Care Provider: [name], MD Consultants:  Code Status:  DNR  Pt Overview and Major Events to Date:  12/01-admitted  Assessment and Plan:  Acute on Chronic Respiratory Failure HFpEF, COPD/asthma, OHS  Reports ongoing shortness of breath and wheeze but has improved since admission. Unable to take a deep breath in. Denies chest pain Pt seems to be improving on Lasix however there is likely an underlying COPD component to her shortness of breath and prednisone should be considered if no response to Lasix tomorrow. Echo on this admission: left EF 60 to 65%. LV normal function. Severely increased LVH. Severe asymmetric hypertrophy measuring 64mm in the basal septum  -Consider cardiology consult- MRI to evaluate for etiology of LVH including HCM/amyloidosis.  -Continuous pulse ox -Additional IV Lasix 80 mg, diurese further if necessary tomorrow if renal function allows -Daily weights -Strict I's and O's -DuoNebs Q6H -CPAP nightly -PT OT  HTN [id number] systolic. Reports compliance with diltiazem, metoprolol. -Continue home meds  Paroxysmal AF, prior PE, on Eliquis HR 87 Home meds: diltiazem and metoprolol for rate control -Continue home Eliquis  Hypokalemia K 3.3 today, 3.6 on admission on 12/1 -KDUR 64meq  MDD On Venflaxine but has been out for a few days. Hydroxyzine prn has not improved symptoms.  -Continue home meds  Prior GI bleed Hb 10.7 today, 11/5 on 12/2 Hospitalized 03/05/19-03/07/2019 likely secondary to diverticular bleed as well as abdominal hematoma sustained s/p fall.   -Continue to monitor hemoglobin  Prediabetes Glucose 128, 117 today Last A1c 6.3 03/19/2019, currently controlled. Did recently receive steroid burst recently as an  outpatient -Continue to monitor CBGs -Consider adding Metformin in the outpatient setting.  Vocal cord dysfunction Has previously worked with [facility] with improvement  Prior homelessness Noted per chart review. Currently living with her niece.  FEN/GI: Heart healthy Prophylaxis: Home Eliquis  Disposition: home with niece in next 2-3 days  Subjective:  Pt has on going shortness of breath has improved slightly since admission is still unable to take a deep breath in. Has ongoing wheeze. Denies chest pain or fevers.  Reported that she will be missing her [facility] clinic appointment tomorrow and she would like [place] to order her bariatric bed, related, shower chair and potty chair for her.  Objective: Temp:  [97.6 F (36.4 C)-98.3 F (36.8 C)] 97.8 F (36.6 C) (12/02 1500) Pulse Rate:  [87-121] 87 (12/02 1500) Resp:  [20-23] 22 (12/02 1500) BP: (122-150)/(61-101) 132/82 (12/02 1500) SpO2:  [94 %-100 %] 96 % (12/02 1502)  General: Alert, cooperative. Increased work of breathing, having nebulizer during examination Cardio: Normal S1 and S2. RRR. No murmurs or rubs.   Pulm: Bilateral expiratory wheeze across all lung fields. Wheeze heard above the trachea.  Increased work of breathing and tachypneic Abdomen: Bowel sounds normal. Abdomen soft and non-tender.  Extremities: Moderate pedal edema bilaterally. Warm/ well perfused.  Strong radial pulse. Neuro: Cranial nerves grossly intact  Laboratory: Recent Labs  Lab 05/28/19 0646 05/29/19 0412  WBC 11.8* 17.7*  HGB 11.5* 10.7*  HCT 38.4 36.0  PLT 431* 472*   Recent Labs  Lab 05/28/19 0646 05/29/19 0412  NA 142 141  K 3.6 3.3*  CL 100 95*  CO2 30 31  BUN 17 17  CREATININE 0.60 0.71  CALCIUM 9.0 9.0  PROT 7.4  --  BILITOT 0.5  --   ALKPHOS 84  --   ALT 29  --   AST 20  --   GLUCOSE 125* 166*      Imaging/Diagnostic Tests: Dg Chest Portable 1 View  Result Date: 05/28/2019 CLINICAL DATA:  Shortness of breath. COPD.  Evaluate for pulmonary edema. EXAM: PORTABLE CHEST 1 VIEW COMPARISON:  One-view chest x-ray 03/17/2019 FINDINGS: The heart is enlarged. Chronic elevation of the right hemidiaphragm is again noted. Atherosclerotic changes are present at the aortic arch. Moderate pulmonary vascular congestion is present without frank edema. No focal airspace consolidation is present. IMPRESSION: 1. Stable cardiomegaly and moderate pulmonary vascular congestion without frank edema. 2. No focal airspace consolidation. 3. Stable chronic elevation of the right hemidiaphragm. Electronically Signed   By: San Morelle M.D.   On: 05/28/2019 06:40    Lattie Haw, MD 05/29/2019, 4:27 PM PGY-1, [place] Intern pager: [phone], text pages welcome

## 2019-05-29 NOTE — TOC Initial Note (Addendum)
Transition of Care [facility]) - Initial/Assessment Note    Patient Details  Name: [name] White MRN: [id number] Date of Birth: 1956-01-18  [facility] of [facility]) CM/SW Contact:    Marilu Favre, RN Phone Number: 05/29/2019, 4:30 PM  Clinical Narrative:                 Spoke to patient and Dr Tammi Klippel potential discharge by end of week.   Patient just left [facility] on Sunday 05-26-19. She is currently staying with her niece Alta Corning [id number] at [address] , [place].   Her home oxygen concentrator is at her nieces home. Her oxygen is through [employer]. Patient plans for her niece to transport her home at discharge. Patient requesting portable oxygen tank at discharge. Zack with [employer] aware and will need to be called on day of discharge.   PT/OT recommendations HHPT,OT hospital bed, tub shower bench Rolator, and patient requesting 3 in 1 . All ordered .   Patient wants the Rolator delivered to her hospital room prior to discharge and all other DME delivered to nieces home prior to discharge. Orders entered and Tamara's name number and address given to Zack with [employer], also requested Rolator be delivered to patient's room.   Patient offered choice for home health . Patient stated [facility] but she is familiar with [facility] and prefers them. Referral given to [facility] with [place] she is checking to see if she can accept referral. Awaiting call back. [facility] has accepted referral.  [place] with [facility] aware.  Expected Discharge Plan: [place]     Patient Goals and CMS Choice   CMS Medicare.gov Compare Post Acute Care list provided to:: Patient Choice offered to / list presented to : Patient  Expected Discharge Plan and Services Expected Discharge Plan: [place]   Discharge Planning Services: CM Consult Post Acute Care Choice: Home Health, Durable  Medical Equipment Living arrangements for the past 2 months: Apartment                 DME Arranged: 3-N-1, Hospital bed, Tub bench, Walker rolling with seat DME Agency: [employer] Date DME Agency Contacted: 05/29/19 Time DME Agency Contacted: 1628 Representative spoke with at DME Agency: [place]: [place], [place] Agency: [place] ([place]) Date [place]: 05/29/19 Time [place]: 1629 Representative spoke with at [place]: [place] Arrangements/Services Living arrangements for the past 2 months: [place] with:: Relatives(niece Tamara [phone]) Patient language and need for interpreter reviewed:: Yes Do you feel safe going back to the place where you live?: Yes      Need for Family Participation in Patient Care: Yes (Comment) Care giver support system in place?: Yes (comment) Current home services: DME Criminal Activity/Legal Involvement Pertinent to Current Situation/Hospitalization: No - Comment as needed  Activities of Daily Living Home Assistive Devices/Equipment: Environmental consultant (specify type), Nebulizer, Cane (specify quad or straight), Eyeglasses ADL Screening (condition at time of admission) Patient's cognitive ability adequate to safely complete daily activities?: Yes Is the patient deaf or have difficulty hearing?: No Does the patient have difficulty seeing, even when wearing glasses/contacts?: No Does the patient have difficulty concentrating, remembering, or making decisions?: No Patient able to express need for assistance with ADLs?: Yes Does the patient have difficulty dressing or bathing?: No Independently performs ADLs?: Yes (appropriate for  developmental age) Does the patient have difficulty walking or climbing stairs?: Yes Weakness of Legs: Both Weakness of Arms/Hands: None  Permission Sought/Granted   Permission granted to share information with : Yes, Verbal Permission Granted     Permission granted to  share info w AGENCY: [place]        Emotional Assessment Appearance:: Appears stated age Attitude/Demeanor/Rapport: Engaged Affect (typically observed): Accepting Orientation: : Oriented to Self, Oriented to Place, Oriented to  Time, Oriented to Situation Alcohol / Substance Use: Not Applicable Psych Involvement: No (comment)  Admission diagnosis:  Vocal cord dysfunction [J38.3] COPD exacerbation (HCC) [J44.1] Congestive heart failure, unspecified HF chronicity, unspecified heart failure type [facility]) [I50.9] Patient Active Problem List   Diagnosis Date Noted  . Acute respiratory failure with hypoxia ([place]) 05/28/2019  . Bronchitis with acute wheezing 05/09/2019  . Hyperglycemia 03/19/2019  . Acute on chronic respiratory failure with hypoxemia ([place]) 03/14/2019  . Acute on chronic heart failure with preserved ejection fraction ([place]) 03/14/2019  . Acquired thrombophilia ([place]) due to A-Fib 03/07/2019  . BRBPR (bright red blood per rectum) 03/06/2019  . Heart failure with preserved ejection fraction ([place]) 03/06/2019  . Active bleeding 03/06/2019  . Obesity hypoventilation syndrome ([place]) 03/06/2019  . Abdominal wall hematoma 03/06/2019  . Lower GI bleed   . High risk social situation   . Urge incontinence 01/27/2019  . Homelessness 12/30/2018  . Reactive airway disease 09/10/2018  . COPD with acute exacerbation ([place]) 08/07/2018  . Sleep apnea 07/05/2018  . Chronic respiratory failure with hypoxia ([place]) 07/05/2018  . Hematoma 11/27/2017  . Severe episode of recurrent major depressive disorder, without psychotic features ([place])   . MDD (major depressive disorder), recurrent episode, severe ([place]) 09/07/2015  . Atrial flutter ([place]) 07/29/2015  . Asthma 11/12/2014  . History of hypertension 11/05/2014  . Paroxysmal atrial fibrillation ([place]) 11/03/2014  . Chronic anticoagulation 11/03/2014  . Hypokalemia 11/03/2014  . Major depressive disorder 11/03/2014  .  History of pulmonary embolus (PE) 11/03/2014  . MDD (major depressive disorder), recurrent severe, without psychosis ([place]) 03/05/2014  . Vocal cord dysfunction 11/12/2013  . Mixed headache 01/06/2012  . Hot flashes 04/22/2011  . OVERACTIVE BLADDER 04/18/2008  . Osteoarthritis of both knees 04/18/2008  . Osteoarthritis involving multiple joints on both sides of body 09/14/2007  . Morbid obesity ([place]) 08/24/2006  . RESTLESS LEGS SYNDROME 08/24/2006  . HYPERTENSION, BENIGN SYSTEMIC 08/24/2006  . RHINITIS, ALLERGIC 08/24/2006  . GASTROESOPHAGEAL REFLUX, NO ESOPHAGITIS 08/24/2006   PCP:  [name], MD Pharmacy:   [facility], [address] [place] [address] Phone: [phone] Fax: [phone]     Social Determinants of Health (SDOH) Interventions    Readmission Risk Interventions Readmission Risk Prevention Plan 03/08/2019  Transportation Screening Complete  Medication Review ([place]) Complete  PCP or Specialist appointment within 3-5 days of discharge Complete  HRI or [place] Complete  SW Recovery Care/Counseling Consult Complete  [place] Not Applicable  Some recent data might be hidden

## 2019-05-29 NOTE — Progress Notes (Signed)
Patient states they do not wear CPAP at home and has not had a sleep study. Pt stated that she will start wearing CPAP tomorrow night. RT will monitor as needed throughout the night.

## 2019-05-29 NOTE — Evaluation (Signed)
Physical Therapy Evaluation Patient Details Name: [name] White MRN: [id number] DOB: August 20, 1955 Today's Date: 05/29/2019   History of Present Illness  63 year old with history of COPD on 2 L home oxygen, HFpEF, PE on anticoagulation, PAF/atrial flutter and obesity presenting with worsening hypoxemic respiratory failure likely due to exacerbation of heart failure, less likely COPD exacerbation. Was recently treated for COPD exacerbation, now with orthopnea, PND, and LE edema in setting of non-adherence to Lasix (no refills at home).   Clinical Impression   Pt admitted with above diagnosis. Comes from home -- had been home with family from SNF for a few days before this admission; Present with decr functional capacity, decr functional mobility, incr need for supplemental O2;  Pt currently with functional limitations due to the deficits listed below (see PT Problem List). Pt will benefit from skilled PT to increase their independence and safety with mobility to allow discharge to the venue listed below.       Follow Up Recommendations Home health PT;Supervision/Assistance - 24 hour    Equipment Recommendations  Other (comment)(Bariatric Rollator RW)    Recommendations for Other Services       Precautions / Restrictions Precautions Precautions: Fall Restrictions Weight Bearing Restrictions: No      Mobility  Bed Mobility Overal bed mobility: Needs Assistance Bed Mobility: Sit to Supine     Supine to sit: Min guard;HOB elevated Sit to supine: Min guard;HOB elevated   General bed mobility comments: Noting she uses momentum to get her LEs into bed; also noted DOE 3/4 with the effort  Transfers Overall transfer level: Needs assistance Equipment used: Rolling walker (2 wheeled) Transfers: Sit to/from Stand Sit to Stand: Min guard         General transfer comment: Minguard for safety and cues for hand placement  Ambulation/Gait Ambulation/Gait assistance: Min guard Gait  Distance (Feet): 6 Feet Assistive device: Rolling walker (2 wheeled) Gait Pattern/deviations: Decreased step length - right;Decreased step length - left;Decreased stride length;Wide base of support     General Gait Details: Cues to self-monitor for activity tolerance; Short, in-room walk on 4 L supplemental O2 via [name]; DOE 2/4  Financial trader Rankin (Stroke Patients Only)       Balance Overall balance assessment: Needs assistance Sitting-balance support: No upper extremity supported;Feet supported Sitting balance-Leahy Scale: Good     Standing balance support: Bilateral upper extremity supported;During functional activity Standing balance-Leahy Scale: Fair                               Pertinent Vitals/Pain Pain Assessment: Faces Faces Pain Scale: Hurts a little bit Pain Location: B knees after ambulation in the room; she attributes this to arthritis pain Pain Descriptors / Indicators: Aching Pain Intervention(s): Monitored during session    Home Living Family/patient expects to be discharged to:: Private residence Living Arrangements: Other relatives Available Help at Discharge: Family Type of Home: House Home Access: Level entry     Home Layout: One level Home Equipment: Cane - single point;Walker - 2 wheels;Shower seat - built Academic librarian Comments: plans to d/c to niece's home as it is more accessible    Prior Function Level of Independence: Independent with assistive device(s)         Comments: mod I to setup/supervision with basic ADLs. "My niece walks with me to the  bathroom." recently d/c from SNF at home with niece for 2 days PTA.     Hand Dominance        Extremity/Trunk Assessment   Upper Extremity Assessment Upper Extremity Assessment: Generalized weakness    Lower Extremity Assessment Lower Extremity Assessment: Defer to PT evaluation;Generalized weakness     Cervical / Trunk Assessment Cervical / Trunk Assessment: (large body habitus)  Communication   Communication: No difficulties  Cognition Arousal/Alertness: Awake/alert Behavior During Therapy: WFL for tasks assessed/performed Overall Cognitive Status: Within Functional Limits for tasks assessed                                        General Comments General comments (skin integrity, edema, etc.): on 4L supplemental O2 throughout session    Exercises     Assessment/Plan    PT Assessment Patient needs continued PT services  PT Problem List Decreased strength;Decreased activity tolerance;Decreased balance;Decreased mobility;Decreased knowledge of use of DME;Cardiopulmonary status limiting activity;Obesity       PT Treatment Interventions DME instruction;Gait training;Functional mobility training;Therapeutic activities;Therapeutic exercise;Balance training;Patient/family education    PT Goals (Current goals can be found in the Care Plan section)  Acute Rehab PT Goals Patient Stated Goal: home with niece PT Goal Formulation: With patient Time For Goal Achievement: 06/12/19 Potential to Achieve Goals: Good    Frequency Min 3X/week   Barriers to discharge        Co-evaluation               AM-PAC PT "6 Clicks" Mobility  Outcome Measure Help needed turning from your back to your side while in a flat bed without using bedrails?: A Little Help needed moving from lying on your back to sitting on the side of a flat bed without using bedrails?: A Little Help needed moving to and from a bed to a chair (including a wheelchair)?: A Little Help needed standing up from a chair using your arms (e.g., wheelchair or bedside chair)?: A Little Help needed to walk in hospital room?: A Little Help needed climbing 3-5 steps with a railing? : A Lot 6 Click Score: 17    End of Session Equipment Utilized During Treatment: Oxygen Activity Tolerance: Patient tolerated  treatment well Patient left: in bed;with call bell/phone within reach;Other (comment)(preparing for Echo) Nurse Communication: Mobility status PT Visit Diagnosis: Difficulty in walking, not elsewhere classified (R26.2);Other (comment)(Decr funcitonal capacity)    Time: 1030-1043 PT Time Calculation (min) (ACUTE ONLY): 13 min   Charges:   PT Evaluation $PT Eval Moderate Complexity: 1 Mod          Roney [place], [place]  Acute Rehabilitation Services Pager [phone] Office [phone]   Colletta [place] 05/29/2019, 12:00 PM

## 2019-05-29 NOTE — Progress Notes (Signed)
SATURATION QUALIFICATIONS: (This note is used to comply with regulatory documentation for home oxygen)  Patient Saturations on Room Air at Rest = 88%  Patient Saturations on Room Air while Ambulating = 87%  Patient Saturations on 4 Liters of oxygen while Ambulating = 96%  Please briefly explain why patient needs home oxygen:

## 2019-05-29 NOTE — Progress Notes (Signed)
FPTS Interim Progress Note  Went to evaluate patient in the afternoon.  Patient states that she is feeling much better and her respiratory status has improved.  Does report some wheezing.  On examination patient did have diffuse expiratory wheezes.  Given that patient continues to have significant wheezing we will plan to treat with prednisone.  We will start prednisone 40 mg daily.    O: BP 132/82 (BP Location: Right Wrist)   Pulse 87   Temp 97.8 F (36.6 C) (Oral)   Resp (!) 22   Ht 5\' 8"  (1.727 m)   Wt (!) 163.3 kg   SpO2 96%   BMI 54.74 kg/m     Caroline More, DO 05/29/2019, 7:00 PM PGY-3, [place] Service pager [phone]

## 2019-05-29 NOTE — Care Management (Signed)
    Durable Medical Equipment  (From admission, onward)         Start     Ordered   05/29/19 1615  For home use only DME Hospital bed  Once    Comments: Please deliver to home Contact person is Alta Corning [id number]   Ht 5'8" wt 163.3 kg   Bariatric  Question Answer Comment  Length of Need 6 Months   Patient has (list medical condition): COPD , heart failure   The above medical condition requires: Patient requires the ability to reposition frequently   Head must be elevated greater than: 45 degrees   Bed type Semi-electric   Support Surface: Gel Overlay      05/29/19 1615   05/29/19 1613  For home use only DME Tub bench  Once    Comments: Please deliver to home Contact person is Alta Corning [id number]   Bariatric Ht 5'8" wt 163.3 kg   05/29/19 1615   05/29/19 1611  For home use only DME 3 n 1  Once    Comments: Bariatric Ht 5'8", wt 163.3 kg   Please deliver to home Contact person is Alta Corning [place]   05/29/19 1615   05/29/19 1610  For home use only DME 4 wheeled rolling walker with seat  Once    Comments: Bariatric Ht 5'8" wt 163.3 kg   Patient wants Rolator delivered to hospital room  Question:  Patient needs a walker to treat with the following condition  Answer:  Acute on chronic respiratory failure ([place])   05/29/19 1610

## 2019-05-29 NOTE — Discharge Instructions (Addendum)
Ms Orbie, Gevorkyan admitted with shortness of breath due to worsening of your heart failure, COPD and because you had ran of out your lasix medication. During your hospital stay we treated you with steroids, nebulizers and lasix which improved your symptoms. You are now ready for discharge. We have refilled your nebulizers, lasix and fostair. We will contact your cardiology regarding the results of your echo during this hospital stay.  We have arranged a follow up appointment at  [facility] on 12/9 at 11:25am. If you cannot attend this appointment please contact the [facility] on [phone] to rearrange.    Thank you.  Best wishes,  Lattie Haw, MD

## 2019-05-29 NOTE — Progress Notes (Signed)
Patient is currently hospitalized under the care of our inpatient team. No charge as this should not be counted as a no show for this patient.

## 2019-05-29 NOTE — Evaluation (Signed)
Occupational Therapy Evaluation Patient Details Name: [name] White MRN: [id number] DOB: 09-14-1955 Today's Date: 05/29/2019    History of Present Illness 63 year old with history of COPD on 2 L home oxygen, HFpEF, PE on anticoagulation, PAF/atrial flutter and obesity presenting with worsening hypoxemic respiratory failure likely due to exacerbation of heart failure, less likely COPD exacerbation. Was recently treated for COPD exacerbation, now with orthopnea, PND, and LE edema in setting of non-adherence to Lasix (no refills at home).    Clinical Impression   Pt admitted with the above diagnoses and presents with below problem list. Pt will benefit from continued acute OT to address the below listed deficits and maximize independence with basic ADLs prior to d/c home. At baseline, pt is setup to mod I with basic ADLs. Pt endorses ambulating limited household distances (to/from bathroom) at baseline. Pt tolerated in room functional mobility fairly well, DOE 1/4. Once she reached the limit of her activity tolerance though she was noted to fatigue quickly and chair was brought to pt for seated rest break. VSS, assessed during seated rest break. Pt is currently min guard with LB ADLs and functional transfers/mobility, setup - supervision with UB ADLs.      Follow Up Recommendations  Home health OT;Supervision - Intermittent(OOB/mobility)    Equipment Recommendations  Hospital bed;Tub/shower bench;Other (comment)(likely needs bariatric)    Recommendations for Other Services       Precautions / Restrictions Precautions Precautions: Fall Restrictions Weight Bearing Restrictions: No      Mobility Bed Mobility Overal bed mobility: Needs Assistance Bed Mobility: Supine to Sit     Supine to sit: Min guard;HOB elevated     General bed mobility comments: HOB elevated. min guard for safety. extra time and effort. uses momentum to powerup  Transfers Overall transfer level: Needs  assistance Equipment used: Rolling walker (2 wheeled) Transfers: Sit to/from Stand Sit to Stand: Min guard         General transfer comment: to/from EOB and standard visitor's chair. min guard for safety. some cueing for hand placement    Balance Overall balance assessment: Needs assistance Sitting-balance support: No upper extremity supported;Feet supported Sitting balance-Leahy Scale: Good     Standing balance support: Bilateral upper extremity supported;During functional activity Standing balance-Leahy Scale: Fair                             ADL either performed or assessed with clinical judgement   ADL Overall ADL's : Needs assistance/impaired Eating/Feeding: Set up;Sitting   Grooming: Set up;Sitting   Upper Body Bathing: Set up;Supervision/ safety;Sitting   Lower Body Bathing: Min guard;Sit to/from stand   Upper Body Dressing : Set up;Sitting   Lower Body Dressing: Min guard;Sit to/from stand   Toilet Transfer: Min guard;Ambulation;RW   Toileting- Water quality scientist and Hygiene: Min guard;Sit to/from stand   Tub/ Shower Transfer: Tub transfer;Min guard;Ambulation;Tub bench;Rolling walker   Functional mobility during ADLs: Min guard;Rolling walker General ADL Comments: Pt walked bathroom distance in room. Tolerated well initially but did quickly fatigue, requested chair. reporting her legs felt "shaky". Pt reports limited household distance ambulation at home.      Vision Baseline Vision/History: Wears glasses       Perception     Praxis      Pertinent Vitals/Pain Pain Assessment: Faces Faces Pain Scale: Hurts a little bit Pain Location: B knees after ambulation in the room Pain Descriptors / Indicators: Aching Pain Intervention(s): Monitored  during session;Repositioned;Limited activity within patient's tolerance     Hand Dominance     Extremity/Trunk Assessment Upper Extremity Assessment Upper Extremity Assessment: Generalized  weakness   Lower Extremity Assessment Lower Extremity Assessment: Defer to PT evaluation;Generalized weakness   Cervical / Trunk Assessment Cervical / Trunk Assessment: (large body habitus)   Communication Communication Communication: No difficulties   Cognition Arousal/Alertness: Awake/alert Behavior During Therapy: WFL for tasks assessed/performed Overall Cognitive Status: Within Functional Limits for tasks assessed                                     General Comments  on 4L O2 throughtout session. talking while walking in room. Did fatigue quickly once she reached her activity limit. VSS, assessed post in room mobility. DOE 1/4    Exercises     Shoulder Instructions      Home Living Family/patient expects to be discharged to:: Private residence Living Arrangements: Other relatives;Other (Comment)(niece) Available Help at Discharge: Family Type of Home: House Home Access: Level entry     Home Layout: One level     Bathroom Shower/Tub: Tub/shower unit         Home Equipment: Cane - single point;Walker - 2 wheels;Shower seat - built Hotel manager: Reacher;Sock aid;Long-handled shoe horn;Long-handled sponge Additional Comments: plans to d/c to niece's home as it is more accessible      Prior Functioning/Environment Level of Independence: Independent with assistive device(s)        Comments: mod I to setup/supervision with basic ADLs. "My niece walks with me to the bathroom." recently d/c from SNF at home with niece for 2 days PTA.        OT Problem List: Decreased activity tolerance;Impaired balance (sitting and/or standing);Decreased knowledge of use of DME or AE;Decreased knowledge of precautions;Cardiopulmonary status limiting activity;Obesity      OT Treatment/Interventions: Self-care/ADL training;Therapeutic exercise;Energy conservation;DME and/or AE instruction;Therapeutic activities;Patient/family  education;Balance training    OT Goals(Current goals can be found in the care plan section) Acute Rehab OT Goals Patient Stated Goal: home with niece OT Goal Formulation: With patient Time For Goal Achievement: 06/12/19 Potential to Achieve Goals: Good ADL Goals Pt Will Perform Upper Body Bathing: with modified independence;sitting Pt Will Perform Lower Body Bathing: with modified independence;sit to/from stand Pt Will Transfer to Toilet: with modified independence;ambulating Pt Will Perform Toileting - Clothing Manipulation and hygiene: with modified independence;sit to/from stand  OT Frequency: Min 2X/week   Barriers to D/C:            Co-evaluation              AM-PAC OT "6 Clicks" Daily Activity     Outcome Measure Help from another person eating meals?: None Help from another person taking care of personal grooming?: None Help from another person toileting, which includes using toliet, bedpan, or urinal?: A Little Help from another person bathing (including washing, rinsing, drying)?: A Little Help from another person to put on and taking off regular upper body clothing?: None Help from another person to put on and taking off regular lower body clothing?: A Little 6 Click Score: 21   End of Session Equipment Utilized During Treatment: Rolling walker;Oxygen(4L/min)  Activity Tolerance: Patient tolerated treatment well;Patient limited by fatigue Patient left: with call bell/phone within reach;Other (comment)(with PT )  OT Visit Diagnosis: Unsteadiness on feet (R26.81);Muscle weakness (generalized) (M62.81)  Time: 1014-1030 OT Time Calculation (min): 16 min Charges:  OT General Charges $OT Visit: 1 Visit OT Evaluation $OT Eval Low Complexity: [place], OT Acute Rehabilitation Services Pager: [phone] Office: [phone]   Hortencia Pilar 05/29/2019, 11:24 AM

## 2019-05-29 NOTE — Progress Notes (Signed)
  Echocardiogram 2D Echocardiogram has been performed.  [name] White 05/29/2019, 11:31 AM

## 2019-05-30 LAB — CBC
HCT: 35.9 % — ABNORMAL LOW (ref 36.0–46.0)
Hemoglobin: 10.4 g/dL — ABNORMAL LOW (ref 12.0–15.0)
MCH: 26.8 pg (ref 26.0–34.0)
MCHC: 29 g/dL — ABNORMAL LOW (ref 30.0–36.0)
MCV: 92.5 fL (ref 80.0–100.0)
Platelets: 461 10*3/uL — ABNORMAL HIGH (ref 150–400)
RBC: 3.88 MIL/uL (ref 3.87–5.11)
RDW: 19.4 % — ABNORMAL HIGH (ref 11.5–15.5)
WBC: 12.6 10*3/uL — ABNORMAL HIGH (ref 4.0–10.5)
nRBC: 0 % (ref 0.0–0.2)

## 2019-05-30 LAB — COMPREHENSIVE METABOLIC PANEL
ALT: 27 U/L (ref 0–44)
AST: 19 U/L (ref 15–41)
Albumin: 2.9 g/dL — ABNORMAL LOW (ref 3.5–5.0)
Alkaline Phosphatase: 83 U/L (ref 38–126)
Anion gap: 8 (ref 5–15)
BUN: 17 mg/dL (ref 8–23)
CO2: 32 mmol/L (ref 22–32)
Calcium: 8.7 mg/dL — ABNORMAL LOW (ref 8.9–10.3)
Chloride: 100 mmol/L (ref 98–111)
Creatinine, Ser: 0.83 mg/dL (ref 0.44–1.00)
GFR calc Af Amer: >60 mL/min (ref >60)
GFR calc non Af Amer: >60 mL/min (ref >60)
Glucose, Bld: 174 mg/dL — ABNORMAL HIGH (ref 70–99)
Potassium: 4.4 mmol/L (ref 3.5–5.1)
Sodium: 140 mmol/L (ref 135–145)
Total Bilirubin: 0.1 mg/dL — ABNORMAL LOW (ref 0.3–1.2)
Total Protein: 6.9 g/dL (ref 6.5–8.1)

## 2019-05-30 LAB — GLUCOSE, CAPILLARY
Glucose-Capillary: 242 mg/dL — ABNORMAL HIGH (ref 70–99)
Glucose-Capillary: 96 mg/dL (ref 70–99)
Glucose-Capillary: 236 mg/dL — ABNORMAL HIGH (ref 70–99)
Glucose-Capillary: 179 mg/dL — ABNORMAL HIGH (ref 70–99)

## 2019-05-30 MED ORDER — FUROSEMIDE 10 MG/ML IJ SOLN
80.0000 mg | Freq: Once | INTRAMUSCULAR | Status: AC
  Administered 2019-05-30: 80 mg via INTRAVENOUS
  Filled 2019-05-30: qty 8

## 2019-05-30 MED ORDER — IPRATROPIUM-ALBUTEROL 0.5-2.5 (3) MG/3ML IN SOLN
3.0000 mL | RESPIRATORY_TRACT | Status: DC | PRN
  Administered 2019-05-30: 3 mL via RESPIRATORY_TRACT

## 2019-05-30 MED ORDER — DICLOFENAC SODIUM 1 % TD GEL
2.0000 g | Freq: Four times a day (QID) | TRANSDERMAL | Status: DC | PRN
  Administered 2019-05-30 – 2019-06-01 (×2): 2 g via TOPICAL
  Filled 2019-05-30 (×2): qty 100

## 2019-05-30 MED ORDER — DICLOFENAC SODIUM 1 % TD GEL: 2.0000 g | Freq: Four times a day (QID) | TRANSDERMAL | Status: DC | PRN

## 2019-05-30 MED ORDER — IPRATROPIUM-ALBUTEROL 0.5-2.5 (3) MG/3ML IN SOLN
RESPIRATORY_TRACT | Status: AC
  Administered 2019-05-30: 3 mL via RESPIRATORY_TRACT
  Filled 2019-05-30: qty 3

## 2019-05-30 MED ORDER — DICLOFENAC SODIUM 1 % EX GEL: 2.0000 g | Freq: Every morning | CUTANEOUS | Status: DC

## 2019-05-30 NOTE — Progress Notes (Signed)
Family Medicine Teaching Service Daily Progress Note Intern Pager: [phone]  Patient name: [name] White Medical record number: [id number] Date of birth: 1955-07-09 Age: 63 y.o. Gender: female  Primary Care Provider: [name], MD Consultants:  Code Status:  DNR  Pt Overview and Major Events to Date:  12/01-admitted  Assessment and Plan:  Acute on Chronic Respiratory Failure HFpEF, COPD/asthma, OHS  Shortness of breath much improved today after lasix, breathing treatments and sleeping with CPAP. Still has on going wheeze but improved. Denies fevers. On examination good AE bilaterally, bilateral expiratory wheeze present, no crackles. Mild peripheral edema of LE.   Echo on this admission: left EF 60 to 65%. LV normal function. Severely increased LVH. Severe asymmetric hypertrophy measuring 77mm in the basal septum  Weight 163.3kg on 12/01 In: 420 mL Out: No documented output S/p IV lasix 80mg  X 2 and 40mg  prednisone (12/2-) -Cardiology consult as OP for MRI to evaluate for etiology of LVH.  -Continuous pulse ox -Oxygen goal 88 to 92% -Further IV Lasix 80mg  -Continue prednisone -Daily weights -Strict I's and O's -DuoNebs Q6H -CPAP nightly -PT recommends home with home health   HTN Bp [id number] systolic.   Home meds:diltiazem, metoprolol. -Continue home meds  Paroxysmal AF, prior PE, on Eliquis HR 95 Home meds: diltiazem and metoprolol for rate control -Continue home Eliquis  Hypokalemia K 4.4 today, 3.3 on 12/2 -Continue to monitor as on diuretic therapy -KDUR 49meq  MDD On Venflaxine but has been out for a few days. Hydroxyzine prn has not improved symptoms.  -Continue home meds  Prior GI bleed-stable Hb 10.4 today, 10.7 on 12/2, stable Hospitalized 03/05/19-03/07/2019 likely secondary to diverticular bleed as well as abdominal hematoma sustained s/p fall.   -Continue to monitor Hb  Prediabetes Glucose 186, 105 over last 24 hours Last A1c 6.3  03/19/2019, currently controlled. Did recently receive steroid burst recently as an outpatient  -Continue to monitor CBGs -Consider adding Metformin in the outpatient setting.  Vocal cord dysfunction Has previously worked with [place] with improvement  Prior homelessness Noted per chart review. Currently living with her niece.  FEN/GI: Heart healthy Prophylaxis: Home Eliquis  Disposition: home with niece in next 2-3 days resolution of heart failure exacerbation  Subjective:  Shortness of breath much improved today after lasix, breathing treatments and sleeping with CPAP. Still has on going wheeze but improved. Denies fevers.  Objective: Temp:  [97.8 F (36.6 C)-98.2 F (36.8 C)] 97.8 F (36.6 C) (12/03 0433) Pulse Rate:  [87-98] 90 (12/03 0956) Resp:  [16-22] 16 (12/03 0433) BP: (102-132)/(65-110) 102/65 (12/03 0433) SpO2:  [92 %-99 %] 97 % (12/03 1341)  General: Alert and cooperative and appears to be in no acute distress Cardio: Normal S1 and S2, RRR. No murmurs or rubs.   Pulm: Good AE bilaterally, bilateral expiratory wheeze present, no crackles.   Abdomen: Bowel sounds normal. Abdomen soft and non-tender.  Extremities: Mild peripheral edema of LE. Warm/ well perfused.  Strong radial and pedal pulses Neuro: Cranial nerves grossly intact  Laboratory: Recent Labs  Lab 05/28/19 0646 05/29/19 0412 05/30/19 0130  WBC 11.8* 17.7* 12.6*  HGB 11.5* 10.7* 10.4*  HCT 38.4 36.0 35.9*  PLT 431* 472* 461*   Recent Labs  Lab 05/28/19 0646 05/29/19 0412 05/30/19 0130  NA 142 141 140  K 3.6 3.3* 4.4  CL 100 95* 100  CO2 30 31 32  BUN 17 17 17   CREATININE 0.60 0.71 0.83  CALCIUM 9.0 9.0 8.7*  PROT 7.4  --  6.9  BILITOT 0.5  --  0.1*  ALKPHOS 84  --  83  ALT 29  --  27  AST 20  --  19  GLUCOSE 125* 166* 174*      Imaging/Diagnostic Tests: No results found.  Lattie Haw, MD 05/30/2019, 2:00 PM PGY-1, [place] Intern pager: [phone], text  pages welcome

## 2019-05-30 NOTE — Progress Notes (Signed)
RT placed patient on CPAP HS auto. 4L O2 bleed in needed. Patient tolerating well at this time.

## 2019-05-30 NOTE — Progress Notes (Signed)
Physical Therapy Treatment Patient Details Name: [name] White MRN: [id number] DOB: [birth year] Today's Date: 05/30/2019    History of Present Illness 63 year old with history of COPD on 2 L home oxygen, HFpEF, PE on anticoagulation, PAF/atrial flutter and obesity presenting with worsening hypoxemic respiratory failure likely due to exacerbation of heart failure, less likely COPD exacerbation. Was recently treated for COPD exacerbation, now with orthopnea, PND, and LE edema in setting of non-adherence to Lasix (no refills at home).     PT Comments    Pt in bed upon PT arrival, eager to participate in PT. The pt was able to demo improved independence with bed mobility and transfers, but still demos need for supplemental oxygen and sig limited tolerance for activity. The pt completed 2 short bouts of ambulation in room (~30 ft x2) and required seated rest breaks fo 2 min and then 5 min to recover. Pt completed multiple LE strengthening exercises and was educated in [place]. Pt agreeable to education. Pt will continue to benefit from skilled PT to address functional endurance.   Follow Up Recommendations  Home health PT;Supervision/Assistance - 24 hour     Equipment Recommendations  Other (comment)(bariatric rollator walker)    Recommendations for Other Services       Precautions / Restrictions Precautions Precautions: Fall Restrictions Weight Bearing Restrictions: No    Mobility  Bed Mobility Overal bed mobility: Needs Assistance Bed Mobility: Sit to Supine     Supine to sit: HOB elevated;Modified independent (Device/Increase time)     General bed mobility comments: pt able to come to sitting EOB without assist  Transfers Overall transfer level: Needs assistance Equipment used: Rolling walker (2 wheeled) Transfers: Sit to/from Stand Sit to Stand: Supervision         General transfer comment: supervision for safety, able to complete with use of RW x3 as well as without use  of BUE x4 (~90 sec)  Ambulation/Gait Ambulation/Gait assistance: Min guard Gait Distance (Feet): 30 Feet(30 ft x2) Assistive device: Rolling walker (2 wheeled) Gait Pattern/deviations: Decreased step length - right;Decreased step length - left;Decreased stride length;Wide base of support   Gait velocity interpretation: <1.31 ft/sec, indicative of household ambulator General Gait Details: Cues to self-monitor for activity tolerance; Short, in-room walk on 3 L supplemental O2 via [place]; DOE 2/4 after 2 min rest, pt agreeable to 2nd 30 ft amb in room, requires 5 min to recover from 2nd bout of amb   Stairs             Wheelchair Mobility    Modified Rankin (Stroke Patients Only)       Balance Overall balance assessment: Needs assistance Sitting-balance support: No upper extremity supported;Feet supported Sitting balance-Leahy Scale: Good     Standing balance support: Bilateral upper extremity supported;During functional activity Standing balance-Leahy Scale: Fair Standing balance comment: BUE support for amb, able to stand from chair without use of arms x4                            Cognition Arousal/Alertness: Awake/alert Behavior During Therapy: WFL for tasks assessed/performed Overall Cognitive Status: Within Functional Limits for tasks assessed                                        Exercises General Exercises - Lower Extremity Long Arc Quad: Strengthening;Both;10 reps;Seated Hip ABduction/ADduction:  AROM;Both;10 reps Toe Raises: AROM;Both;10 reps Heel Raises: Strengthening;Both;20 reps    General Comments General comments (skin integrity, edema, etc.): on 3L O2 through session above 94% with most activity, one drop to 86% during 2nd amb, recovered to 96% after 30 sec seated rest      Pertinent Vitals/Pain Pain Assessment: Faces Faces Pain Scale: Hurts a little bit Pain Location: B knees after ambulation in the room; she attributes  this to arthritis pain Pain Descriptors / Indicators: Aching;Sore Pain Intervention(s): Limited activity within patient's tolerance;Repositioned;Monitored during session    Home Living                      Prior Function            PT Goals (current goals can now be found in the care plan section) Acute Rehab PT Goals Patient Stated Goal: home with niece, to be able to walk in and get a pedicure PT Goal Formulation: With patient Time For Goal Achievement: 06/12/19 Potential to Achieve Goals: Good Progress towards PT goals: Progressing toward goals    Frequency    Min 3X/week      PT Plan Current plan remains appropriate    Co-evaluation              AM-PAC PT "6 Clicks" Mobility   Outcome Measure  Help needed turning from your back to your side while in a flat bed without using bedrails?: A Little Help needed moving from lying on your back to sitting on the side of a flat bed without using bedrails?: A Little Help needed moving to and from a bed to a chair (including a wheelchair)?: A Little Help needed standing up from a chair using your arms (e.g., wheelchair or bedside chair)?: A Little Help needed to walk in hospital room?: A Little Help needed climbing 3-5 steps with a railing? : A Lot 6 Click Score: 17    End of Session Equipment Utilized During Treatment: Oxygen;Gait belt(3LO2) Activity Tolerance: Patient tolerated treatment well Patient left: in chair;with call bell/phone within reach;with chair alarm set Nurse Communication: Mobility status PT Visit Diagnosis: Difficulty in walking, not elsewhere classified (R26.2);Other (comment)     Time: [id number] PT Time Calculation (min) (ACUTE ONLY): 45 min  Charges:  $Gait Training: 8-22 mins $Therapeutic Exercise: 8-22 mins $Self Care/Home Management: 8-22                     Mickey Farber, PT, DPT   Acute Rehabilitation Department [phone]   Otho Bellows 05/30/2019, 10:16  AM

## 2019-05-30 NOTE — Discharge Summary (Addendum)
[facility] Discharge Summary  Patient name: [name] White Medical record number: [id number] Date of birth: Jun 14, 1956 Age: 63 y.o. Gender: female Date of Admission: 05/28/2019  Date of Discharge: 12/6 Admitting Physician: Martyn Malay, MD  Primary Care Provider: [name], MD Consultants:   Indication for Hospitalization: Acute on chronic respiratory failure  Discharge Diagnoses/Problem List:  HFpEF COPD/asthma OHS not on CPAP HTN Paroxysmal AF H/o PE on Eliquis  MDD Prediabetes Vocal cord dysfunction  Disposition: home with niece  Discharge Condition: medically stable for discharge   Discharge Exam: General: morbidly obese female, in NAD Cardiac: irregular rhythm, normal rate, 1+ LE pitting edema Pulm: CTAB, no wheezes or rales, normal WOB on RA Abdomen: soft, nontender to palpation, obese abdomen Extremities: warm and well perfused Neuro: alert and oriented, normal speech.  Brief Hospital Course:  Acute on Chronic Respiratory Failure  [name] White is a 63 y.o. female with history of COPD on 2 L home oxygen, HFpEF, h/o PE on anticoagulation, PAF/atrial flutter and obesity presenting with worsening hypoxemic respiratory failure likely secondary to HFpEF exacerbation in the setting of being out of her home Lasix for 2 days prior to presentation and with obvious volume overload on initial exam. Also component of COPD/asthma as well as OHS. Patient was diuresed with IV diuretics prior to restarting home Lasix with adequate urine output.  She was also started on a 5-day steroid course (prednisone 40mg ) for pronounced wheezing heard on exam, although this is likely thought to reflect cardiac wheeze rather than true COPD exacerbation given lack of sputum production and recently finishing outpatient antibiotic and steroid course. Also responded well to Duonebs.  Pt's vitals were stable on discharge, pt was on her home oxygen of 2L and  peripheral and pulmonary edema had significantly improved. She was medically fit for discharge on 12/6.  Issues for Follow Up:  1. Med changes: 1. Pt discharged with 1 day of 40mg  prednisone (day 5 of steroids) 2. Pt discharged with duonebs  2. Updated echo this admission with severe asymmetric hypertrophy measuring 62mm in the basal septum.  Recommend cardiology for any MRI to evaluate for etiology of LVH including HCM/amyloidosis. 3. OP pulmonologist setting up sleep study, ensure patient obtains this.  Patient tolerated CPAP well during admission.  4. Prediabtes, HbA1c 6.3 on 9/22. Please monitor and consider Metformin given recent steroid bursts.  Significant Procedures:   Significant Labs and Imaging:  Recent Labs  Lab 05/30/19 0130 05/31/19 0107 06/01/19 0321  WBC 12.6* 12.5* 12.2*  HGB 10.4* 10.3* 10.2*  HCT 35.9* 35.1* 34.9*  PLT 461* 460* 448*   Recent Labs  Lab 05/29/19 0412 05/30/19 0130 05/31/19 0107 06/01/19 0321  NA 141 140 138 140  K 3.3* 4.4 3.8 3.8  CL 95* 100 96* 98  CO2 31 32 33* 35*  GLUCOSE 166* 174* 138* 89  BUN 17 17 17 11   CREATININE 0.71 0.83 0.50 0.48  CALCIUM 9.0 8.7* 8.7* 8.7*  ALKPHOS  --  83 79 78  AST  --  19 14* 13*  ALT  --  27 23 23   ALBUMIN  --  2.9* 2.8* 2.7*      Results/Tests Pending at Time of Discharge:   Discharge Medications:  Allergies as of 06/02/2019      Reactions   Caffeine Other (See Comments)   Migraine   Hydralazine Hcl    Other reaction(s): Other (See Comments) AKI leading to rhabdo and electrolyte abnormalities  Hydrocodone Nausea And Vomiting   Headache   Ciprofloxacin Hives, Rash   Erythromycin Hives, Rash   Lisinopril Cough   Oxycodone Nausea And Vomiting   Headache   Sulfamethoxazole-trimethoprim Rash      Medication List    STOP taking these medications   budesonide-formoterol 80-4.5 MCG/ACT inhaler Commonly known as: SYMBICORT   dextromethorphan 15 MG/5ML syrup     TAKE these  medications   acetaminophen 325 MG tablet Commonly known as: TYLENOL Take 2 tablets (650 mg total) by mouth every 6 (six) hours as needed for mild pain.   albuterol 108 (90 Base) MCG/ACT inhaler Commonly known as: VENTOLIN HFA Inhale 1-2 puffs into the lungs every 6 (six) hours as needed for wheezing or shortness of breath.   albuterol (2.5 MG/3ML) 0.083% nebulizer solution Commonly known as: PROVENTIL Take 3 mLs (2.5 mg total) by nebulization every 6 (six) hours as needed for wheezing or shortness of breath.   apixaban 5 MG Tabs tablet Commonly known as: ELIQUIS Take 1 tablet (5 mg total) by mouth 2 (two) times daily.   azelastine 0.05 % ophthalmic solution Commonly known as: OPTIVAR Place 1 drop into both eyes 2 (two) times daily as needed (itchy eyes).   azelastine 0.1 % nasal spray Commonly known as: ASTELIN Place 2 sprays into both nostrils 2 (two) times daily. Use in each nostril as directed   benzonatate 200 MG capsule Commonly known as: TESSALON Take 1 capsule (200 mg total) by mouth 3 (three) times daily as needed for cough.   cyclobenzaprine 10 MG tablet Commonly known as: FLEXERIL Take 1 tablet (10 mg total) by mouth 3 (three) times daily as needed for up to 3 days for muscle spasms.   cycloSPORINE 0.05 % ophthalmic emulsion Commonly known as: RESTASIS Place 1 drop into both eyes 2 (two) times daily.   diclofenac sodium 1 % Gel Commonly known as: VOLTAREN APPLY 2 GRAMS EXTERNALLY TO THE AFFECTED AREA FOUR TIMES DAILY What changed: See the new instructions.   diltiazem 120 MG 24 hr capsule Commonly known as: CARDIZEM CD Take 1 capsule (120 mg total) by mouth daily. Please keep upcoming appt in January before anymore refills. Final Attempt What changed: when to take this   famotidine 20 MG tablet Commonly known as: PEPCID Take 1 tablet (20 mg total) by mouth daily.   flecainide 100 MG tablet Commonly known as: TAMBOCOR TAKE 1 TABLET BY MOUTH 2 TIMES A  DAY. PLEASE KEEP UPCOMING APPOINTMENT IN JANUARY BEFORE ANYMORE REFILLS. What changed: See the new instructions.   fluticasone 50 MCG/ACT nasal spray Commonly known as: FLONASE Place 2 sprays into both nostrils daily.   furosemide 40 MG tablet Commonly known as: LASIX Take 1 tablet (40 mg total) by mouth daily.   guaiFENesin-dextromethorphan 100-10 MG/5ML syrup Commonly known as: ROBITUSSIN DM Take 5 mLs by mouth every 4 (four) hours as needed for cough.   hydrocortisone 25 MG suppository Commonly known as: ANUSOL-HC Place 1 suppository (25 mg total) rectally 2 (two) times daily. What changed:   when to take this  reasons to take this   hydrOXYzine 25 MG capsule Commonly known as: VISTARIL Take 1 capsule (25 mg total) by mouth 2 (two) times daily as needed for anxiety.   ipratropium-albuterol 0.5-2.5 (3) MG/3ML Soln Commonly known as: DUONEB Take 3 mLs by nebulization 3 (three) times daily.   loratadine 10 MG tablet Commonly known as: CLARITIN TAKE 1 TABLET(10 MG) BY MOUTH DAILY What changed: See the new  instructions.   metoprolol tartrate 25 MG tablet Commonly known as: LOPRESSOR Take 0.5 tablets (12.5 mg total) by mouth 2 (two) times daily. What changed: how much to take   mometasone-formoterol 100-5 MCG/ACT Aero Commonly known as: DULERA Inhale 2 puffs into the lungs 2 (two) times daily.   montelukast 10 MG tablet Commonly known as: SINGULAIR Take 1 tablet (10 mg total) by mouth at bedtime.   omeprazole 20 MG capsule Commonly known as: PRILOSEC Take 1 capsule (20 mg total) by mouth daily.   OXYGEN Inhale 2 L into the lungs continuous.   potassium chloride SA 20 MEQ tablet Commonly known as: KLOR-CON Take 1 tablet (20 mEq total) by mouth daily. What changed: when to take this   venlafaxine XR 150 MG 24 hr capsule Commonly known as: EFFEXOR-XR Take 1 capsule (150 mg total) by mouth daily with breakfast. Take 150mg  capsule with 37.5mg  tablet What  changed: how much to take       Discharge Instructions: Please refer to Patient Instructions section of EMR for full details.  Patient was counseled important signs and symptoms that should prompt return to medical care, changes in medications, dietary instructions, activity restrictions, and follow up appointments.   Follow-Up Appointments: Follow-up [place], [facility] Follow up.   Contact information: [place] [address] [place] Follow up on 06/05/2019.   Why: @ 11:25am Contact information: [place] [phone]          Lattie Haw, MD 06/04/2019, 6:51 AM PGY-1, [facility] Upper-Level Resident Addendum   I have discussed the above with the original author and agree with their documentation. My edits for correction/addition/clarification are above. Please see also any attending notes.   Caroline More, DO PGY-3, [facility] 06/04/2019 9:49 AM  FPTS Service pager: [phone] (text pages welcome through [facility])

## 2019-05-30 NOTE — Care Management (Signed)
Spoke with Zack with [facility] and asked for delivery of DME to home except Rolator today . Magdalen Spatz RN

## 2019-05-31 LAB — CBC
HCT: 35.1 % — ABNORMAL LOW (ref 36.0–46.0)
Hemoglobin: 10.3 g/dL — ABNORMAL LOW (ref 12.0–15.0)
MCH: 27 pg (ref 26.0–34.0)
MCHC: 29.3 g/dL — ABNORMAL LOW (ref 30.0–36.0)
MCV: 91.9 fL (ref 80.0–100.0)
Platelets: 460 10*3/uL — ABNORMAL HIGH (ref 150–400)
RBC: 3.82 MIL/uL — ABNORMAL LOW (ref 3.87–5.11)
RDW: 19.2 % — ABNORMAL HIGH (ref 11.5–15.5)
WBC: 12.5 10*3/uL — ABNORMAL HIGH (ref 4.0–10.5)
nRBC: 0 % (ref 0.0–0.2)

## 2019-05-31 LAB — COMPREHENSIVE METABOLIC PANEL
ALT: 23 U/L (ref 0–44)
AST: 14 U/L — ABNORMAL LOW (ref 15–41)
Albumin: 2.8 g/dL — ABNORMAL LOW (ref 3.5–5.0)
Alkaline Phosphatase: 79 U/L (ref 38–126)
Anion gap: 9 (ref 5–15)
BUN: 17 mg/dL (ref 8–23)
CO2: 33 mmol/L — ABNORMAL HIGH (ref 22–32)
Calcium: 8.7 mg/dL — ABNORMAL LOW (ref 8.9–10.3)
Chloride: 96 mmol/L — ABNORMAL LOW (ref 98–111)
Creatinine, Ser: 0.5 mg/dL (ref 0.44–1.00)
GFR calc Af Amer: >60 mL/min (ref >60)
GFR calc non Af Amer: >60 mL/min (ref >60)
Glucose, Bld: 138 mg/dL — ABNORMAL HIGH (ref 70–99)
Potassium: 3.8 mmol/L (ref 3.5–5.1)
Sodium: 138 mmol/L (ref 135–145)
Total Bilirubin: 0.3 mg/dL (ref 0.3–1.2)
Total Protein: 6.7 g/dL (ref 6.5–8.1)

## 2019-05-31 LAB — GLUCOSE, CAPILLARY
Glucose-Capillary: 96 mg/dL (ref 70–99)
Glucose-Capillary: 186 mg/dL — ABNORMAL HIGH (ref 70–99)
Glucose-Capillary: 202 mg/dL — ABNORMAL HIGH (ref 70–99)
Glucose-Capillary: 190 mg/dL — ABNORMAL HIGH (ref 70–99)

## 2019-05-31 MED ORDER — CYCLOBENZAPRINE HCL 10 MG PO TABS
10.0000 mg | ORAL_TABLET | Freq: Once | ORAL | Status: AC
  Administered 2019-05-31: 10 mg via ORAL
  Filled 2019-05-31: qty 1

## 2019-05-31 MED ORDER — FUROSEMIDE 40 MG PO TABS
40.0000 mg | ORAL_TABLET | Freq: Every day | ORAL | Status: DC
  Administered 2019-05-31 – 2019-06-02 (×3): 40 mg via ORAL
  Filled 2019-05-31 (×3): qty 1

## 2019-05-31 NOTE — Progress Notes (Signed)
Md called to have accurate measurement of urine output from yesterday morning. Patient had 2933ml urine from 05/30/2019 at 0930-05/31/2019 at 0800.

## 2019-05-31 NOTE — Progress Notes (Signed)
Physical Therapy Treatment Patient Details Name: [name] White MRN: [id number] DOB: [birth year] Today's Date: 05/31/2019    History of Present Illness 63 year old with history of COPD on 2 L home oxygen, HFpEF, PE on anticoagulation, PAF/atrial flutter and obesity presenting with worsening hypoxemic respiratory failure likely due to exacerbation of heart failure, less likely COPD exacerbation. Was recently treated for COPD exacerbation, now with orthopnea, PND, and LE edema in setting of non-adherence to Lasix (no refills at home).     PT Comments    Pt in bed upon PT arrival, agreeable to PT session with focus on ambulation endurance and self-management of symptoms. Pt was able to sig increase ambulation distances, and was able to use new rollator walker safely to have seated rest after ~40 ft. Pt desat to 77% with 4LO2 while ambulating, upped to 6L for improved O2 saturation. Pt with improved self management and sig decreased seated rest break duration (>5 min for first break, 1.5 min for second break). Pt will continue to benefit from skilled PT to address endurance and tolerance for mobility as well as self-management.     Follow Up Recommendations  Home health PT;Supervision/Assistance - 24 hour     Equipment Recommendations  Other (comment)(bariatric rollator)    Recommendations for Other Services       Precautions / Restrictions Precautions Precautions: Fall Precaution Comments: watch O2 sat Restrictions Weight Bearing Restrictions: No    Mobility  Bed Mobility Overal bed mobility: Needs Assistance Bed Mobility: Sit to Supine     Supine to sit: HOB elevated;Modified independent (Device/Increase time)     General bed mobility comments: pt able to come to sitting EOB without assist  Transfers Overall transfer level: Needs assistance Equipment used: 4-wheeled walker Transfers: Sit to/from Stand Sit to Stand: Supervision         General transfer comment:  supervision for safety, able to complete with use of RW x3  Ambulation/Gait Ambulation/Gait assistance: Min guard Gait Distance (Feet): 40 Feet(40 ft x 3 = 120 ft total) Assistive device: 4-wheeled walker Gait Pattern/deviations: Decreased step length - right;Decreased step length - left;Decreased stride length;Wide base of support   Gait velocity interpretation: <1.31 ft/sec, indicative of household ambulator General Gait Details: Cues to self-monitor for activity tolerance; increased amb distance to hall on 4L supplemental O2 via [place], pt required seated rest break, and had elevated HR (120-150). 2 more 40 ft amb with seated rest ~1.5 min between to recover breathing. DOE 3/4 BORG RPE 7-8/10   Stairs             Wheelchair Mobility    Modified Rankin (Stroke Patients Only)       Balance Overall balance assessment: Needs assistance Sitting-balance support: No upper extremity supported;Feet supported Sitting balance-Leahy Scale: Good     Standing balance support: Bilateral upper extremity supported;During functional activity Standing balance-Leahy Scale: Fair Standing balance comment: BUE support for amb, able to stand from chair without use of arms x4                            Cognition Arousal/Alertness: Awake/alert Behavior During Therapy: WFL for tasks assessed/performed Overall Cognitive Status: Within Functional Limits for tasks assessed                                        Exercises  General Comments General comments (skin integrity, edema, etc.): on 3.5L O2 at rest with good sat > 95%. Pt on 4L initially for amb, but desat to 77, so increased O2 to 6L for amb.      Pertinent Vitals/Pain Pain Assessment: No/denies pain Pain Intervention(s): Monitored during session;Repositioned    Home Living                      Prior Function            PT Goals (current goals can now be found in the care plan section)  Acute Rehab PT Goals Patient Stated Goal: home with niece, to be able to walk in and get a pedicure PT Goal Formulation: With patient Time For Goal Achievement: 06/12/19 Potential to Achieve Goals: Good Progress towards PT goals: Progressing toward goals    Frequency    Min 3X/week      PT Plan Current plan remains appropriate    Co-evaluation              AM-PAC PT "6 Clicks" Mobility   Outcome Measure  Help needed turning from your back to your side while in a flat bed without using bedrails?: None Help needed moving from lying on your back to sitting on the side of a flat bed without using bedrails?: None Help needed moving to and from a bed to a chair (including a wheelchair)?: A Little Help needed standing up from a chair using your arms (e.g., wheelchair or bedside chair)?: None Help needed to walk in hospital room?: A Little Help needed climbing 3-5 steps with a railing? : A Little 6 Click Score: 21    End of Session Equipment Utilized During Treatment: Oxygen;Gait belt Activity Tolerance: Patient tolerated treatment well Patient left: in chair;with call bell/phone within reach;with chair alarm set Nurse Communication: Mobility status PT Visit Diagnosis: Difficulty in walking, not elsewhere classified (R26.2);Other (comment)     [id number] PT Time Calculation (min) (ACUTE ONLY): 49 min  Charges:  $Gait Training: 23-37 mins $Self Care/Home Management: 8-22                     Mickey Farber, PT, DPT   Acute Rehabilitation Department [phone]   Otho Bellows 05/31/2019, 1:56 PM

## 2019-05-31 NOTE — Plan of Care (Signed)

## 2019-05-31 NOTE — Progress Notes (Signed)
Family Medicine Teaching Service Daily Progress Note Intern Pager: [phone]  Patient name: [name] White Medical record number: [id number] Date of birth: May 18, 1956 Age: 63 y.o. Gender: female  Primary Care Provider: [name], MD Consultants:  Code Status:  DNR  Pt Overview and Major Events to Date:  12/01-admitted  Assessment and Plan:  Acute on Chronic Respiratory Failure HFpEF, COPD/asthma, OHS  SOB improved, still minor wheezing. Has on going cough but thinks this is likely 2/2 secretions in upper airways following nebulizer. Mild bilateral wheeze throughout lung fields, few bibasilar crackles however good air entry. Sats 100% on 4L  Echo on this admission: left EF 60 to 65%. LV normal function. Severely increased LVH. Severe asymmetric hypertrophy measuring 36mm in the basal septum  Weight 165.1kg today, 163.3kg on 12/01 In: 1.1L Out: 2.9L S/p IV lasix 80mg  X 3 and 40mg  prednisone (12/2-) -Cardiology consult as OP for MRI to evaluate for etiology of LVH.  -Continuous pulse ox -Oxygen goal 88 to 92% -Restart home dose 40 mg Lasix once daily -Continue prednisone, total 5-day course -Daily weights -Strict I's and O's -DuoNebs Q6H -CPAP nightly -PT recommends home with home health  -Discharge with duonebs, pt has nebulizer machine at home   HTN Bp [id number] systolic.  Home meds:diltiazem, metoprolol. -Continue home meds  Paroxysmal AF, prior PE, on Eliquis HR 95 Home meds: diltiazem and metoprolol for rate control -Continue home Eliquis  Hypokalemia K 3.8, 4.4 on 12/3 -Continue to monitor as on diuretic therapy -KDUR 76meq  MDD On Venflaxine but has been out for a few days. Hydroxyzine prn has not improved symptoms.  -Continue home meds  Prior GI bleed-stable Hb 10.3 today Hospitalized 03/05/19-03/07/2019 likely secondary to diverticular bleed as well as abdominal hematoma sustained s/p fall.   -Continue to monitor Hb  Prediabetes Glucose 96, 179  over last 24 hours Last A1c 6.3 03/19/2019, currently controlled. Did recently receive steroid burst recently as an outpatient  -Continue to monitor CBGs -Consider adding Metformin in the outpatient setting.  Vocal cord dysfunction Has previously worked with [facility] with [facility]  Prior homelessness Noted per chart review. Currently living with her niece.  FEN/GI: Heart healthy Prophylaxis: Home Eliquis  Disposition: home with niece tomorrow  Subjective:  SOB improved, still minor wheezing. Has on going cough but thinks this is likely 2/2 secretions in upper airways following nebulizers.  Objective: Temp:  [97.6 F (36.4 C)-98.1 F (36.7 C)] 97.6 F (36.4 C) (12/04 0505) Pulse Rate:  [82-102] 82 (12/04 0505) Resp:  [16-22] 16 (12/04 0505) BP: (123-134)/(75-84) 134/84 (12/04 0505) SpO2:  [97 %-100 %] 100 % (12/04 0505) FiO2 (%):  [94 %] 94 % (12/04 0810) Weight:  [165.1 kg] 165.1 kg (12/04 0500)  General: Alert and cooperative and appears to be in no acute distress Cardio: Normal S1 and S2, RRR, No murmurs or rubs.   Pulm: Mild bilateral wheeze throughout lung fields, few bibasilar crackles however good air entry. Abdomen: Bowel sounds normal. Abdomen soft and non-tender.  Extremities: Mild pedal edema bilaterally warm/well perfused.  Strong radial pulse Neuro: Cranial nerves grossly intact  Laboratory: Recent Labs  Lab 05/29/19 0412 05/30/19 0130 05/31/19 0107  WBC 17.7* 12.6* 12.5*  HGB 10.7* 10.4* 10.3*  HCT 36.0 35.9* 35.1*  PLT 472* 461* 460*   Recent Labs  Lab 05/28/19 0646 05/29/19 0412 05/30/19 0130 05/31/19 0107  NA 142 141 140 138  K 3.6 3.3* 4.4 3.8  CL 100 95* 100 96*  CO2 30 31 32  33*  BUN 17 17 17 17   CREATININE 0.60 0.71 0.83 0.50  CALCIUM 9.0 9.0 8.7* 8.7*  PROT 7.4  --  6.9 6.7  BILITOT 0.5  --  0.1* 0.3  ALKPHOS 84  --  83 79  ALT 29  --  27 23  AST 20  --  19 14*  GLUCOSE 125* 166* 174* 138*      Imaging/Diagnostic Tests: No  results found.  Lattie Haw, MD 05/31/2019, 12:29 PM PGY-1, [place] Intern pager: [phone], text pages welcome

## 2019-05-31 NOTE — Progress Notes (Addendum)
OT Cancellation Note  Patient Details Name: [name] White MRN: [id number] DOB: 05-29-1956   Cancelled Treatment:    Reason Eval/Treat Not Completed: Fatigue/lethargy limiting ability to participate;Other (comment) Pt declined OT session stating, she was "too tired from working with PT." will check back as time allows. Checked back on pt a second time with pt stating that she was "waiting to hear back from her niece to work on DC "as pt just found out she will be dc'ed this weekend. Will continue to follow.   Lanier Clam., COTA/L Acute Rehabilitation Services [phone] [place] 05/31/2019, 1:24 PM

## 2019-06-01 LAB — COMPREHENSIVE METABOLIC PANEL
ALT: 23 U/L (ref 0–44)
AST: 13 U/L — ABNORMAL LOW (ref 15–41)
Albumin: 2.7 g/dL — ABNORMAL LOW (ref 3.5–5.0)
Alkaline Phosphatase: 78 U/L (ref 38–126)
Anion gap: 7 (ref 5–15)
BUN: 11 mg/dL (ref 8–23)
CO2: 35 mmol/L — ABNORMAL HIGH (ref 22–32)
Calcium: 8.7 mg/dL — ABNORMAL LOW (ref 8.9–10.3)
Chloride: 98 mmol/L (ref 98–111)
Creatinine, Ser: 0.48 mg/dL (ref 0.44–1.00)
GFR calc Af Amer: >60 mL/min (ref >60)
GFR calc non Af Amer: >60 mL/min (ref >60)
Glucose, Bld: 89 mg/dL (ref 70–99)
Potassium: 3.8 mmol/L (ref 3.5–5.1)
Sodium: 140 mmol/L (ref 135–145)
Total Bilirubin: <0.1 mg/dL — ABNORMAL LOW (ref 0.3–1.2)
Total Protein: 6.4 g/dL — ABNORMAL LOW (ref 6.5–8.1)

## 2019-06-01 LAB — GLUCOSE, CAPILLARY
Glucose-Capillary: 108 mg/dL — ABNORMAL HIGH (ref 70–99)
Glucose-Capillary: 127 mg/dL — ABNORMAL HIGH (ref 70–99)
Glucose-Capillary: 205 mg/dL — ABNORMAL HIGH (ref 70–99)
Glucose-Capillary: 202 mg/dL — ABNORMAL HIGH (ref 70–99)

## 2019-06-01 LAB — CBC
HCT: 34.9 % — ABNORMAL LOW (ref 36.0–46.0)
Hemoglobin: 10.2 g/dL — ABNORMAL LOW (ref 12.0–15.0)
MCH: 27 pg (ref 26.0–34.0)
MCHC: 29.2 g/dL — ABNORMAL LOW (ref 30.0–36.0)
MCV: 92.3 fL (ref 80.0–100.0)
Platelets: 448 10*3/uL — ABNORMAL HIGH (ref 150–400)
RBC: 3.78 MIL/uL — ABNORMAL LOW (ref 3.87–5.11)
RDW: 18.8 % — ABNORMAL HIGH (ref 11.5–15.5)
WBC: 12.2 10*3/uL — ABNORMAL HIGH (ref 4.0–10.5)
nRBC: 0 % (ref 0.0–0.2)

## 2019-06-01 MED ORDER — GUAIFENESIN-DM 100-10 MG/5ML PO SYRP: 5.0000 mL | ORAL_SOLUTION | ORAL | 0 refills | Status: DC | PRN

## 2019-06-01 MED ORDER — VENLAFAXINE HCL ER 150 MG PO CP24: 187.5000 mg | ORAL_CAPSULE | Freq: Every day | ORAL | Status: DC

## 2019-06-01 MED ORDER — CYCLOBENZAPRINE HCL 10 MG PO TABS
10.0000 mg | ORAL_TABLET | Freq: Three times a day (TID) | ORAL | Status: DC | PRN
  Administered 2019-06-01: 10 mg via ORAL
  Filled 2019-06-01 (×2): qty 1

## 2019-06-01 MED ORDER — FUROSEMIDE 40 MG PO TABS: 40.0000 mg | ORAL_TABLET | Freq: Every day | ORAL | 0 refills | Status: DC

## 2019-06-01 MED ORDER — IPRATROPIUM-ALBUTEROL 0.5-2.5 (3) MG/3ML IN SOLN: 3.0000 mL | Freq: Three times a day (TID) | RESPIRATORY_TRACT | 3 refills | Status: DC

## 2019-06-01 MED ORDER — OMEPRAZOLE 20 MG PO CPDR: 20.0000 mg | DELAYED_RELEASE_CAPSULE | Freq: Every day | ORAL | 0 refills | Status: DC

## 2019-06-01 MED ORDER — AZELASTINE HCL 0.1 % NA SOLN: 2.0000 | Freq: Two times a day (BID) | NASAL | 0 refills | Status: DC

## 2019-06-01 MED ORDER — BUDESONIDE-FORMOTEROL FUMARATE 80-4.5 MCG/ACT IN AERO: 2.0000 | INHALATION_SPRAY | Freq: Two times a day (BID) | RESPIRATORY_TRACT | 12 refills | Status: DC

## 2019-06-01 MED ORDER — DICLOFENAC SODIUM 1 % TD GEL
4.0000 g | Freq: Four times a day (QID) | TRANSDERMAL | Status: DC | PRN
  Filled 2019-06-01: qty 100

## 2019-06-01 MED ORDER — MONTELUKAST SODIUM 10 MG PO TABS: 10.0000 mg | ORAL_TABLET | Freq: Every day | ORAL | 0 refills | Status: DC

## 2019-06-01 MED ORDER — PREDNISONE 20 MG PO TABS: 40.0000 mg | ORAL_TABLET | Freq: Every day | ORAL | 0 refills | Status: DC

## 2019-06-01 NOTE — Progress Notes (Signed)
Family Medicine Teaching Service Daily Progress Note Intern Pager: [phone]  Patient name: [name] White Medical record number: [id number] Date of birth: 10/22/1955 Age: 63 y.o. Gender: female  Primary Care Provider: [name], MD Consultants:  Code Status:  DNR  Pt Overview and Major Events to Date:  12/01-admitted  Assessment and Plan:  Acute on Chronic Respiratory Failure HFpEF, COPD/asthma, OHS  Doing well today, shortness of breath continues to improve and feels not far off baseline. Few bibasilar crackles on examination, no wheeze but yesterday where wheeze was present throughout lung fields, mild to moderate increased work of breathing which is patient's baseline Sats 100% on 2L~home oxygen, weaned from 4 L yesterday Echo on this admission: left EF 60 to 65%. LV normal function. Severely increased LVH. Severe asymmetric hypertrophy measuring 40mm in the basal septum  Weight 165.1kg on 12/4, 163.3kg on 12/01 In: 66mL Out: 1.6L over last 24 hrs S/p IV lasix 80mg  X 3, 40mg  prednisone (12/2-), now on home Lasix dose 80 mg once daily -Continuous pulse ox -Oxygen goal 88 to 92% -Continue 40 mg Lasix once daily -Continue prednisone, total 5-day course -Daily weights -Strict I's and O's -DuoNebs Q6H -CPAP nightly -PT recommends home with home health  -Cardiology consult as OP for MRI to evaluate for etiology of LVH.  -Discharge with duonebs and Lasix as patient ran out, pt has nebulizer machine at home   HTN Bp 0000000 systolic.  Home meds:diltiazem, metoprolol. -Continue home meds  Paroxysmal AF, prior PE, on Eliquis HR 77 Home meds: diltiazem and metoprolol for rate control -Continue home Eliquis  Hypokalemia-resolved K 3.8 today, 3.8 on 12/4, stable  -Continue to monitor as on diuretic therapy  MDD On Venflaxine but has been out for a few days. Hydroxyzine prn has not improved symptoms.  -Continue home meds  Prior GI bleed-stable Hb 10.2 today, 10.3  on 12/4 Hospitalized 03/05/19-03/07/2019 likely secondary to diverticular bleed as well as abdominal hematoma sustained s/p fall.   -Continue to monitor Hb  Prediabetes Glucose 108, 190 over last 24 hours Last A1c 6.3 03/19/2019, currently controlled. Did recently receive steroid burst recently as an outpatient  -Continue to monitor CBGs -Consider adding Metformin in the outpatient setting.  Vocal cord dysfunction Has previously worked with [place] with improvement  Prior homelessness Noted per chart review. Currently living with her niece.  FEN/GI: Heart healthy Prophylaxis: Home Eliquis  Disposition: discharge home with niece on 12/6   Subjective:  Doing well today, shortness of breath continues to improve and feels not far off baseline.  Is going to check with niece whether she is able to have her home tomorrow morning at 7 AM otherwise will go home on Monday. Would like to make sure she has nebulisers, lasix and singulair on discharge as she has run out. Patient reports that she would like to have gastric band placed to help with weight loss.  I said I will write a note to her PCP on discharge and this is a discussion she can have with Dr. Kris Mouton.  Objective: Temp:  [97.9 F (36.6 C)-98 F (36.7 C)] 98 F (36.7 C) (12/05 0457) Pulse Rate:  [75-120] 77 (12/05 0457) Resp:  [18-20] 20 (12/05 0457) BP: (109-135)/(73-84) 135/84 (12/05 0457) SpO2:  [96 %-100 %] 100 % (12/05 0457) FiO2 (%):  [94 %] 94 % (12/04 0810)  General: Alert, pleasant cooperative, no acute distress Cardio: Normal S1 and S2, RRR, No murmurs or rubs.   Pulm: Bibasilar crackles on examination, no  wheezing, or diminished breath sounds. mild to moderate increased work of breathing~ patiet's baseline Abdomen: Bowel sounds normal. Abdomen soft and non-tender.  Extremities: Mild pedal edema 1+ which has improved significantly from previous examinations, warm/ well perfused.  Strong radial pulse Neuro: Cranial nerves  grossly intact  Laboratory: Recent Labs  Lab 05/30/19 0130 05/31/19 0107 06/01/19 0321  WBC 12.6* 12.5* 12.2*  HGB 10.4* 10.3* 10.2*  HCT 35.9* 35.1* 34.9*  PLT 461* 460* 448*   Recent Labs  Lab 05/30/19 0130 05/31/19 0107 06/01/19 0321  NA 140 138 140  K 4.4 3.8 3.8  CL 100 96* 98  CO2 32 33* 35*  BUN 17 17 11   CREATININE 0.83 0.50 0.48  CALCIUM 8.7* 8.7* 8.7*  PROT 6.9 6.7 6.4*  BILITOT 0.1* 0.3 <0.1*  ALKPHOS 83 79 78  ALT 27 23 23   AST 19 14* 13*  GLUCOSE 174* 138* 89    Imaging/Diagnostic Tests: No results found.  Lattie Haw, MD 06/01/2019, 8:03 AM PGY-1, [place] Intern pager: [phone], text pages welcome

## 2019-06-01 NOTE — Plan of Care (Signed)

## 2019-06-01 NOTE — Progress Notes (Signed)
Occupational Therapy Treatment Patient Details Name: [name] White MRN: [id number] DOB: 1955/08/22 Today's Date: 06/01/2019    History of present illness 63 year old with history of COPD on 2 L home oxygen, HFpEF, PE on anticoagulation, PAF/atrial flutter and obesity presenting with worsening hypoxemic respiratory failure likely due to exacerbation of heart failure, less likely COPD exacerbation. Was recently treated for COPD exacerbation, now with orthopnea, PND, and LE edema in setting of non-adherence to Lasix (no refills at home).    OT comments  Pt. Seen for skilled OT treatment session. Very pleasant and motivated for participation.  Bed mobility, ambulation in room, and toileting/grooming task in b.room.  o2 to 87% during ambulation to b.room but rebounded to 94% with short seated rest break on rolator.  Reviewed energy conservation during ADL completion.  Pt. Returned demo with washing hands seated on rolator vs. Standing.  Cont. With ADL completion with integration of energy conservation next session.   Follow Up Recommendations  Home health OT;Supervision - Intermittent    Equipment Recommendations  Hospital bed;Tub/shower bench;Other (comment)    Recommendations for Other Services      Precautions / Restrictions Precautions Precautions: Fall Precaution Comments: watch O2 sat       Mobility Bed Mobility Overal bed mobility: Needs Assistance Bed Mobility: Sit to Supine     Supine to sit: HOB elevated;Modified independent (Device/Increase time)        Transfers Overall transfer level: Needs assistance Equipment used: 4-wheeled walker Transfers: Sit to/from [facility] Sit to Stand: Supervision Stand pivot transfers: Supervision       General transfer comment: supervision for safety    Balance                                           ADL either performed or assessed with clinical judgement   ADL Overall ADL's : Needs  assistance/impaired     Grooming: Set up;Sitting                   Toilet Transfer: Min guard;Ambulation;RW Toilet Transfer Details (indicate cue type and reason): rolator Toileting- Clothing Manipulation and Hygiene: Sitting/lateral lean       Functional mobility during ADLs: Min guard;Rolling walker General ADL Comments: pt. moving well, reports using o2 at home so demonsrated awaremess of tube management during ambulation     Vision       Perception     Praxis      Cognition Arousal/Alertness: Awake/alert Behavior During Therapy: WFL for tasks assessed/performed Overall Cognitive Status: Within Functional Limits for tasks assessed                                          Exercises     Shoulder Instructions       General Comments      Pertinent Vitals/ Pain       Pain Assessment: No/denies pain  Home Living                                          Prior Functioning/Environment              Frequency  Min 2X/week  Progress Toward Goals  OT Goals(current goals can now be found in the care plan section)  Progress towards OT goals: Progressing toward goals     Plan      Co-evaluation                 AM-PAC OT "6 Clicks" Daily Activity     Outcome Measure   Help from another person eating meals?: None Help from another person taking care of personal grooming?: None Help from another person toileting, which includes using toliet, bedpan, or urinal?: A Little Help from another person bathing (including washing, rinsing, drying)?: A Little Help from another person to put on and taking off regular upper body clothing?: None Help from another person to put on and taking off regular lower body clothing?: A Little 6 Click Score: 21    End of Session Equipment Utilized During Treatment: Rolling walker;Oxygen  OT Visit Diagnosis: Unsteadiness on feet (R26.81);Muscle weakness (generalized)  (M62.81)   Activity Tolerance Patient tolerated treatment well;Patient limited by fatigue   Patient Left with call bell/phone within reach;Other (comment);in chair   Nurse Communication          Time: [phone] OT Time Calculation (min): 30 min  Charges: OT General Charges $OT Visit: 1 Visit OT Treatments $Self Care/Home Management : 23-37 mins  Janice Coffin , COTA/L 06/01/2019, 12:49 PM

## 2019-06-01 NOTE — Progress Notes (Signed)
Family Medicine Teaching Service Daily Progress Note Intern Pager: [phone]  Patient name: [name] White Medical record number: [id number] Date of birth: 1955-08-02 Age: 63 y.o. Gender: female  Primary Care Provider: [name], MD Consultants:  Code Status:  DNR  Pt Overview and Major Events to Date:  12/01-admitted for AoC respiratory failure  Assessment and Plan: [name] White is a 63 y.o. female presenting with shortness of breath. PMH is significant for HFpEF, COPD/asthma, obesity, OHS not on CPAP, PAF on Eliquis, HTN, MDD, vocal cord dysfunction, H/o VTE in 2015.  Acute on Chronic Respiratory Failure, mulitfactorial HFpEF, COPD/asthma, OHS  Stable overnight with home 2 L O2, appropriately saturated.  Has diuresed well s/p IV Lasix and now on home Lasix 40 mg daily and continues with adequate UOP, -1.7L last 24 hours.  Echo this admission largely unchanged from prior.  No new weight today.  On exam, lungs CTAB without wheezes or crackles.  Subjectively feels better. Finishes steroid course today. -Continue home oxygen with goal 88 to 92% -Continue 40 mg Lasix once daily -Continue prednisone, total 5-day course, will get last dose prior to d/c -Daily weights -Strict I's and O's -DuoNebs TID with prn q4h -CPAP nightly -PT recommends home with home health  -Cardiology consult as OP for cardiac MRI to evaluate for etiology of LVH.  -Discharge with duonebs and Lasix as patient ran out, pt has nebulizer machine at home   HTN Normotensive overnight.  Home meds:diltiazem, metoprolol. -Continue home meds  Paroxysmal AF, prior PE, on Eliquis In afib this am, rate controlled. Home meds: diltiazem and metoprolol for rate control -Continue home Eliquis  Hypokalemia-resolved -Continue to monitor as on diuretic therapy  MDD On Venflaxine but has been out for a few days. Hydroxyzine prn has not improved symptoms.  -Continue home meds  Prior GI  bleed-stable Hospitalized 03/05/19-03/07/2019 likely secondary to diverticular bleed as well as abdominal hematoma sustained s/p fall.    Hemoglobin remained stable throughout admission.  Prediabetes Glucose 100-200S over last 24 hours with 6u aspart in last 24 hours. Last A1c 6.3 03/19/2019, currently controlled. Did recently receive steroid burst recently as an outpatient.  -Continue to monitor CBGs -Consider adding Metformin in the outpatient setting.  Vocal cord dysfunction Has previously worked with [place] with improvement  Prior homelessness Noted per chart review. Currently living with her niece.  FEN/GI: Heart healthy Prophylaxis: Home Eliquis  Disposition: d/c to home today   Subjective:  Feeling much better today, looking forward to going home and decorating for Christmas.  Objective: Temp:  [98 F (36.7 C)-98.1 F (36.7 C)] 98.1 F (36.7 C) (12/05 1421) Pulse Rate:  [76-85] 76 (12/05 1421) Resp:  [17-20] 17 (12/05 1421) BP: (118-135)/(69-84) 118/69 (12/05 1421) SpO2:  [97 %-100 %] 100 % (12/05 2052) FiO2 (%):  [94 %] 94 % (12/05 0852)  General: morbidly obese female, in NAD Cardiac: irregular rhythm, normal rate, 1+ LE pitting edema Pulm: CTAB, no wheezes or rales, normal WOB on RA Abdomen: soft, nontender to palpation, obese abdomen Extremities: warm and well perfused Neuro: alert and oriented, normal speech.  Laboratory: Recent Labs  Lab 05/30/19 0130 05/31/19 0107 06/01/19 0321  WBC 12.6* 12.5* 12.2*  HGB 10.4* 10.3* 10.2*  HCT 35.9* 35.1* 34.9*  PLT 461* 460* 448*   Recent Labs  Lab 05/30/19 0130 05/31/19 0107 06/01/19 0321  NA 140 138 140  K 4.4 3.8 3.8  CL 100 96* 98  CO2 32 33* 35*  BUN 17 17  11  CREATININE 0.83 0.50 0.48  CALCIUM 8.7* 8.7* 8.7*  PROT 6.9 6.7 6.4*  BILITOT 0.1* 0.3 <0.1*  ALKPHOS 83 79 78  ALT 27 23 23   AST 19 14* 13*  GLUCOSE 174* 138* 89    Imaging/Diagnostic Tests: No results found.  [name] Percy,  DO 06/01/2019, 9:43 PM PGY-3, [place] Intern pager: [phone], text pages welcome

## 2019-06-01 NOTE — Plan of Care (Signed)

## 2019-06-02 LAB — GLUCOSE, CAPILLARY: Glucose-Capillary: 145 mg/dL — ABNORMAL HIGH (ref 70–99)

## 2019-06-02 MED ORDER — HYDROXYZINE PAMOATE 25 MG PO CAPS: 25.0000 mg | ORAL_CAPSULE | Freq: Two times a day (BID) | ORAL | 0 refills | Status: DC | PRN

## 2019-06-02 MED ORDER — CYCLOBENZAPRINE HCL 10 MG PO TABS: 10.0000 mg | ORAL_TABLET | Freq: Three times a day (TID) | ORAL | 0 refills | Status: AC | PRN

## 2019-06-02 MED ORDER — MOMETASONE FURO-FORMOTEROL FUM 100-5 MCG/ACT IN AERO: 2.0000 | INHALATION_SPRAY | Freq: Two times a day (BID) | RESPIRATORY_TRACT | 0 refills | Status: DC

## 2019-06-02 MED ORDER — METOPROLOL TARTRATE 25 MG PO TABS: 12.5000 mg | ORAL_TABLET | Freq: Two times a day (BID) | ORAL | 0 refills | Status: DC

## 2019-06-02 NOTE — TOC Transition Note (Signed)
Transition of Care [facility]) - CM/SW Discharge Note   Patient Details  Name: [name] White MRN: [id number] Date of Birth: December 11, 1955  Transition of [facility] CM/SW Contact:  Carles Collet, RN Phone Number: 06/02/2019, 11:14 AM   Clinical Narrative:    Verified w liaison that patient accepted w [facility] and notified she will DC today. Spoke w patient's niece and verified that rolator has been delivered to room.  The rest of the DME will be delivered to the house tomorrow per Jonelle Sidle, and she has accommodations for her tonight. Jonelle Sidle will provide transport and I gave requested [employer] to bring portable oxygen tank to room for DC today. No other CM needs identified    Final next level of care: Home w Home Health Services Barriers to Discharge: No Barriers Identified   Patient Goals and CMS Choice   CMS Medicare.gov Compare Post Acute Care list provided to:: Patient Choice offered to / list presented to : Patient  Discharge Placement                       Discharge Plan and Services   Discharge Planning Services: CM Consult Post Acute Care Choice: Home Health, Durable Medical Equipment          DME Arranged: 3-N-1, Hospital bed, Tub bench, Walker rolling with seat DME Agency: [employer] Date DME Agency Contacted: 05/29/19 Time DME Agency Contacted: [id number] Representative spoke with at DME Agency: [place]: OT, PT [place] Agency: [place] ([place]) Date [place]: 05/29/19 Time [place]: 1629 Representative spoke with at [place]: [place] ([place]) Interventions     Readmission Risk Interventions Readmission Risk Prevention Plan 03/08/2019  Transportation Screening Complete  Medication Review Press photographer) Complete  PCP or Specialist appointment within 3-5 days of discharge Complete  HRI or [place] Complete  SW Recovery Care/Counseling Consult Complete  [place] Not Applicable  Some recent data might be hidden

## 2019-06-02 NOTE — Progress Notes (Signed)
AVS given and reviewed with pt. Medications discussed. All questions answered to satisfaction. Pt verbalized understanding of information given. Pt to be escorted off the unit with all belongings via wheelchair by staff member.  

## 2019-06-02 NOTE — Plan of Care (Signed)
  Problem: Education: Goal: Knowledge of General Education information will improve Description: Including pain rating scale, medication(s)/side effects and non-pharmacologic comfort measures Outcome: Progressing   Problem: Health Behavior/Discharge Planning: Goal: Ability to manage health-related needs will improve Outcome: Progressing   Problem: Clinical Measurements: Goal: Ability to maintain clinical measurements within normal limits will improve Outcome: Progressing Goal: Respiratory complications will improve Outcome: Progressing   Problem: Activity: Goal: Risk for activity intolerance will decrease Outcome: Progressing   Problem: Nutrition: Goal: Adequate nutrition will be maintained Outcome: Progressing   Problem: Elimination: Goal: Will not experience complications related to urinary retention Outcome: Progressing   Problem: Pain Managment: Goal: General experience of comfort will improve Outcome: Progressing   Problem: Safety: Goal: Ability to remain free from injury will improve Outcome: Progressing   Problem: Skin Integrity: Goal: Risk for impaired skin integrity will decrease Outcome: Progressing

## 2019-06-03 DIAGNOSIS — N3281 Overactive bladder: Secondary | ICD-10-CM | POA: Diagnosis not present

## 2019-06-03 DIAGNOSIS — J449 Chronic obstructive pulmonary disease, unspecified: Secondary | ICD-10-CM | POA: Diagnosis not present

## 2019-06-05 ENCOUNTER — Telehealth (INDEPENDENT_AMBULATORY_CARE_PROVIDER_SITE_OTHER): Payer: Medicare HMO | Admitting: Student in an Organized Health Care Education/Training Program

## 2019-06-05 ENCOUNTER — Other Ambulatory Visit: Payer: Self-pay

## 2019-06-05 DIAGNOSIS — R635 Abnormal weight gain: Secondary | ICD-10-CM

## 2019-06-05 DIAGNOSIS — N318 Other neuromuscular dysfunction of bladder: Secondary | ICD-10-CM

## 2019-06-05 DIAGNOSIS — T50905A Adverse effect of unspecified drugs, medicaments and biological substances, initial encounter: Secondary | ICD-10-CM | POA: Insufficient documentation

## 2019-06-05 DIAGNOSIS — I5033 Acute on chronic diastolic (congestive) heart failure: Secondary | ICD-10-CM

## 2019-06-05 DIAGNOSIS — J9611 Chronic respiratory failure with hypoxia: Secondary | ICD-10-CM

## 2019-06-05 MED ORDER — FAMOTIDINE 20 MG PO TABS: 20.0000 mg | ORAL_TABLET | Freq: Every day | ORAL | 0 refills | Status: DC

## 2019-06-05 MED ORDER — MOMETASONE FURO-FORMOTEROL FUM 100-5 MCG/ACT IN AERO: 2.0000 | INHALATION_SPRAY | Freq: Two times a day (BID) | RESPIRATORY_TRACT | 0 refills | Status: DC

## 2019-06-05 NOTE — Progress Notes (Signed)
[place] Telemedicine Visit  Patient consented to have virtual visit. Method of visit: Telephone  Encounter participants: Patient: [name] White - located at home Provider: Richarda Osmond - located at [facility] Others (if applicable): michelle  Chief Complaint: hosp f/u for acute on chronic HF exacerbation  HPI: Patient feels much improved overall. Her breathing improved has improved to baseline. She continues to use her home oxygen and feels that it is drying out her nostrils.  She is still feeling generally weak/loss of strength and is remedying this with moving more.  Home physical therapy coming but haven't been able to schedule yet. Today spoke to PT and is waiting for call back. Some feet and ankle swelling still. Elevates when resting which improves. More than when presented to hospital for recent admission. Contributes this to not having feet elevated due to not having hospital bed right away but will improve with elevation. Weighs herself every morning and is stable. However, patient cannot recall what her weight was this morning.  Finished steroids day after discharge. Has not noticed any hyperglycemic symptoms.  Has not set up appointment with lung doctor yet as she was not aware that she was supposed to do that. She endorses good tolerability of the CPAP on her last night in hospital and would like to pursue a sleep study and continue to use a CPAP. She will contact her pulmonologist to set this up.  Couple issues to address: 1. Can't walk well and uses diuretics so has accidents. Wears briefs typically. Would like to see if we can prescribe to [employer] for more briefs.  2. Would like room humidifier.  3. Famotidine and dulera- needs refill. 4. Would like to come in for appointment to discuss bariatric surgery and nutrition/exercise goals for weight loss but not until after weight loss. Endorses 7 months of steroid use contributing to  weight gain.   ROS: per HPI  Pertinent PMHx:  Hospitalized for acute   Exam:  Respiratory: negative for conversational dyspnea. Had one episode of dry cough during encounter.   Assessment/Plan:  Acute on chronic heart failure with preserved ejection fraction (HCC) Improved respiratory status with continued fatigue and lower extremity edema. -Continue to obtain daily weights intake diuretic. -Follow-up with cardiology -Follow-up with PT -Follow-up with pulmonology for sleep study and possible CPAP use  Chronic respiratory failure with hypoxia ([place]) Recommending humidifier for room and refilling Dulera  [employer] Initiated contact with RN team to obtain DME supplies for incontinence  Weight gain due to medication Long-term steroid use has contributed likely to weight gain.  Patient sounds motivated to lose weight through diet and exercise and also interested in bariatric surgery if needed. -Recommended patient make appointment to further discuss bariatric surgery at her convenience -Sent in referral for nutrition consult    Time spent during visit with patient: 19 minutes

## 2019-06-05 NOTE — Assessment & Plan Note (Signed)
Long-term steroid use has contributed likely to weight gain.  Patient sounds motivated to lose weight through diet and exercise and also interested in bariatric surgery if needed. -Recommended patient make appointment to further discuss bariatric surgery at her convenience -Sent in referral for nutrition consult

## 2019-06-05 NOTE — Assessment & Plan Note (Signed)
Improved respiratory status with continued fatigue and lower extremity edema. -Continue to obtain daily weights intake diuretic. -Follow-up with cardiology -Follow-up with PT -Follow-up with pulmonology for sleep study and possible CPAP use

## 2019-06-05 NOTE — Assessment & Plan Note (Signed)
Recommending humidifier for room and refilling [facility]

## 2019-06-05 NOTE — Assessment & Plan Note (Signed)
Initiated contact with RN team to obtain DME supplies for incontinence

## 2019-06-06 ENCOUNTER — Other Ambulatory Visit: Payer: Self-pay

## 2019-06-06 ENCOUNTER — Telehealth: Payer: Medicare HMO

## 2019-06-06 ENCOUNTER — Ambulatory Visit: Payer: Medicare HMO

## 2019-06-06 ENCOUNTER — Inpatient Hospital Stay: Payer: Medicare HMO | Admitting: Family Medicine

## 2019-06-06 NOTE — Chronic Care Management (AMB) (Signed)
  Care Management   Outreach Note  06/06/2019 Name: SHRUTHIKA ORMAND MRN: [id number] DOB: 08-02-1955  Referred by: [name], MD Reason for referral : Care Coordination (Care Management Initial )   An unsuccessful telephone outreach was attempted today. The patient was referred to the case management team by for assistance with care management and care coordination.   Follow Up Plan: The care management team will reach out to the patient again over the next 7 days.   Lazaro Arms RN, BSN, [facility] Care Management Coordinator [place] Phone: [phone] I Office: [phone] Fax: [phone]

## 2019-06-07 ENCOUNTER — Other Ambulatory Visit: Payer: Self-pay | Admitting: Family Medicine

## 2019-06-07 ENCOUNTER — Other Ambulatory Visit: Payer: Self-pay | Admitting: Internal Medicine

## 2019-06-07 ENCOUNTER — Telehealth: Payer: Self-pay | Admitting: *Deleted

## 2019-06-07 NOTE — Telephone Encounter (Signed)
Community message sent to [employer] and Darlina Guys @ [facility] to process DME order for incontinence supplies.   Christen Bame, CMA

## 2019-06-09 DIAGNOSIS — J9611 Chronic respiratory failure with hypoxia: Secondary | ICD-10-CM | POA: Diagnosis not present

## 2019-06-10 NOTE — Telephone Encounter (Signed)
[employer] has received and will process now. Christen Bame, CMA

## 2019-06-11 ENCOUNTER — Telehealth: Payer: Self-pay

## 2019-06-11 ENCOUNTER — Telehealth: Payer: Self-pay | Admitting: Primary Care

## 2019-06-11 NOTE — Telephone Encounter (Signed)
Pt calling to let Dr. Kris Mouton know that she has increased her O2 from 2-3 ml. Ottis Stain, CMA

## 2019-06-11 NOTE — Telephone Encounter (Signed)
Left message for patient to call back  

## 2019-06-12 DIAGNOSIS — E662 Morbid (severe) obesity with alveolar hypoventilation: Secondary | ICD-10-CM | POA: Diagnosis not present

## 2019-06-12 DIAGNOSIS — M170 Bilateral primary osteoarthritis of knee: Secondary | ICD-10-CM | POA: Diagnosis not present

## 2019-06-12 DIAGNOSIS — G2581 Restless legs syndrome: Secondary | ICD-10-CM | POA: Diagnosis not present

## 2019-06-12 DIAGNOSIS — I5033 Acute on chronic diastolic (congestive) heart failure: Secondary | ICD-10-CM | POA: Diagnosis not present

## 2019-06-12 DIAGNOSIS — J441 Chronic obstructive pulmonary disease with (acute) exacerbation: Secondary | ICD-10-CM | POA: Diagnosis not present

## 2019-06-12 DIAGNOSIS — I480 Paroxysmal atrial fibrillation: Secondary | ICD-10-CM | POA: Diagnosis not present

## 2019-06-12 DIAGNOSIS — J9621 Acute and chronic respiratory failure with hypoxia: Secondary | ICD-10-CM | POA: Diagnosis not present

## 2019-06-12 DIAGNOSIS — I110 Hypertensive heart disease with heart failure: Secondary | ICD-10-CM | POA: Diagnosis not present

## 2019-06-12 DIAGNOSIS — I4892 Unspecified atrial flutter: Secondary | ICD-10-CM | POA: Diagnosis not present

## 2019-06-12 NOTE — Telephone Encounter (Signed)
Called spoke with patient who reported that she was admitted from 12/1 through 12/6 for COPD exacerbation and her O2 was increased at discharge to 3L (I see no documentation in the discharge to support this).  Patient stated that she is doing well and feels a little better every day.    HFU televisit scheduled with Beth NP for 12/21 @ 1430.  Patient aware to contact the office if anything is needed prior to visit.

## 2019-06-13 ENCOUNTER — Telehealth: Payer: Self-pay | Admitting: *Deleted

## 2019-06-13 NOTE — Telephone Encounter (Signed)
Jerilyn from [facility] calling for PT verbal orders as follows:  2 time(s) weekly for 6 week(s)  OT- Eval and Treat  HH RN -  2 times a week for 4 weeks  SW - eval and treat  You can leave verbal orders on confidential voicemail.  Christen Bame, CMA

## 2019-06-14 ENCOUNTER — Other Ambulatory Visit: Payer: Self-pay

## 2019-06-14 ENCOUNTER — Encounter (HOSPITAL_COMMUNITY): Payer: Self-pay | Admitting: Emergency Medicine

## 2019-06-14 ENCOUNTER — Inpatient Hospital Stay (HOSPITAL_COMMUNITY)
Admission: EM | Admit: 2019-06-14 | Discharge: 2019-06-16 | DRG: 190 | Disposition: A | Discharge status: Home Health | Payer: Medicare HMO | Attending: Family Medicine | Admitting: Family Medicine

## 2019-06-14 ENCOUNTER — Emergency Department (HOSPITAL_COMMUNITY): Payer: Medicare HMO

## 2019-06-14 ENCOUNTER — Telehealth: Payer: Medicare HMO

## 2019-06-14 DIAGNOSIS — I110 Hypertensive heart disease with heart failure: Secondary | ICD-10-CM | POA: Diagnosis present

## 2019-06-14 DIAGNOSIS — R0602 Shortness of breath: Secondary | ICD-10-CM | POA: Diagnosis not present

## 2019-06-14 DIAGNOSIS — J383 Other diseases of vocal cords: Secondary | ICD-10-CM | POA: Diagnosis present

## 2019-06-14 DIAGNOSIS — I4891 Unspecified atrial fibrillation: Secondary | ICD-10-CM | POA: Diagnosis not present

## 2019-06-14 DIAGNOSIS — Z888 Allergy status to other drugs, medicaments and biological substances status: Secondary | ICD-10-CM

## 2019-06-14 DIAGNOSIS — E662 Morbid (severe) obesity with alveolar hypoventilation: Secondary | ICD-10-CM | POA: Diagnosis not present

## 2019-06-14 DIAGNOSIS — Z7901 Long term (current) use of anticoagulants: Secondary | ICD-10-CM | POA: Diagnosis not present

## 2019-06-14 DIAGNOSIS — R0902 Hypoxemia: Secondary | ICD-10-CM

## 2019-06-14 DIAGNOSIS — Z8249 Family history of ischemic heart disease and other diseases of the circulatory system: Secondary | ICD-10-CM | POA: Diagnosis not present

## 2019-06-14 DIAGNOSIS — Z86711 Personal history of pulmonary embolism: Secondary | ICD-10-CM | POA: Diagnosis not present

## 2019-06-14 DIAGNOSIS — R000 Tachycardia, unspecified: Secondary | ICD-10-CM | POA: Diagnosis not present

## 2019-06-14 DIAGNOSIS — Z66 Do not resuscitate: Secondary | ICD-10-CM | POA: Diagnosis present

## 2019-06-14 DIAGNOSIS — Z6841 Body Mass Index (BMI) 40.0 and over, adult: Secondary | ICD-10-CM | POA: Diagnosis not present

## 2019-06-14 DIAGNOSIS — N3281 Overactive bladder: Secondary | ICD-10-CM | POA: Diagnosis not present

## 2019-06-14 DIAGNOSIS — Z885 Allergy status to narcotic agent status: Secondary | ICD-10-CM | POA: Diagnosis not present

## 2019-06-14 DIAGNOSIS — J441 Chronic obstructive pulmonary disease with (acute) exacerbation: Principal | ICD-10-CM | POA: Diagnosis present

## 2019-06-14 DIAGNOSIS — G2581 Restless legs syndrome: Secondary | ICD-10-CM | POA: Diagnosis present

## 2019-06-14 DIAGNOSIS — J189 Pneumonia, unspecified organism: Secondary | ICD-10-CM | POA: Diagnosis not present

## 2019-06-14 DIAGNOSIS — Z79899 Other long term (current) drug therapy: Secondary | ICD-10-CM

## 2019-06-14 DIAGNOSIS — E039 Hypothyroidism, unspecified: Secondary | ICD-10-CM | POA: Diagnosis not present

## 2019-06-14 DIAGNOSIS — E876 Hypokalemia: Secondary | ICD-10-CM | POA: Diagnosis present

## 2019-06-14 DIAGNOSIS — Z825 Family history of asthma and other chronic lower respiratory diseases: Secondary | ICD-10-CM | POA: Diagnosis not present

## 2019-06-14 DIAGNOSIS — Z87891 Personal history of nicotine dependence: Secondary | ICD-10-CM | POA: Diagnosis not present

## 2019-06-14 DIAGNOSIS — Z881 Allergy status to other antibiotic agents status: Secondary | ICD-10-CM | POA: Diagnosis not present

## 2019-06-14 DIAGNOSIS — Z803 Family history of malignant neoplasm of breast: Secondary | ICD-10-CM

## 2019-06-14 DIAGNOSIS — I480 Paroxysmal atrial fibrillation: Secondary | ICD-10-CM | POA: Diagnosis present

## 2019-06-14 DIAGNOSIS — R279 Unspecified lack of coordination: Secondary | ICD-10-CM | POA: Diagnosis not present

## 2019-06-14 DIAGNOSIS — I4892 Unspecified atrial flutter: Secondary | ICD-10-CM | POA: Diagnosis present

## 2019-06-14 DIAGNOSIS — R7303 Prediabetes: Secondary | ICD-10-CM | POA: Diagnosis present

## 2019-06-14 DIAGNOSIS — M159 Polyosteoarthritis, unspecified: Secondary | ICD-10-CM | POA: Diagnosis present

## 2019-06-14 DIAGNOSIS — J9621 Acute and chronic respiratory failure with hypoxia: Secondary | ICD-10-CM | POA: Diagnosis not present

## 2019-06-14 DIAGNOSIS — J9600 Acute respiratory failure, unspecified whether with hypoxia or hypercapnia: Secondary | ICD-10-CM | POA: Diagnosis not present

## 2019-06-14 DIAGNOSIS — I5032 Chronic diastolic (congestive) heart failure: Secondary | ICD-10-CM | POA: Diagnosis not present

## 2019-06-14 DIAGNOSIS — K219 Gastro-esophageal reflux disease without esophagitis: Secondary | ICD-10-CM | POA: Diagnosis present

## 2019-06-14 DIAGNOSIS — Z20828 Contact with and (suspected) exposure to other viral communicable diseases: Secondary | ICD-10-CM | POA: Diagnosis not present

## 2019-06-14 DIAGNOSIS — R0789 Other chest pain: Secondary | ICD-10-CM | POA: Diagnosis not present

## 2019-06-14 DIAGNOSIS — Z9981 Dependence on supplemental oxygen: Secondary | ICD-10-CM | POA: Diagnosis not present

## 2019-06-14 DIAGNOSIS — Z86718 Personal history of other venous thrombosis and embolism: Secondary | ICD-10-CM

## 2019-06-14 DIAGNOSIS — F329 Major depressive disorder, single episode, unspecified: Secondary | ICD-10-CM | POA: Diagnosis present

## 2019-06-14 DIAGNOSIS — J9690 Respiratory failure, unspecified, unspecified whether with hypoxia or hypercapnia: Secondary | ICD-10-CM | POA: Diagnosis present

## 2019-06-14 DIAGNOSIS — Z743 Need for continuous supervision: Secondary | ICD-10-CM | POA: Diagnosis not present

## 2019-06-14 DIAGNOSIS — Z882 Allergy status to sulfonamides status: Secondary | ICD-10-CM

## 2019-06-14 DIAGNOSIS — R0689 Other abnormalities of breathing: Secondary | ICD-10-CM | POA: Diagnosis not present

## 2019-06-14 LAB — URINALYSIS, ROUTINE W REFLEX MICROSCOPIC
Bilirubin Urine: NEGATIVE
Glucose, UA: NEGATIVE mg/dL
Hgb urine dipstick: NEGATIVE
Ketones, ur: 5 mg/dL — AB
Nitrite: NEGATIVE
Protein, ur: NEGATIVE mg/dL
Specific Gravity, Urine: 1.015 (ref 1.005–1.030)
pH: 6 (ref 5.0–8.0)

## 2019-06-14 LAB — RESPIRATORY PANEL BY RT PCR (FLU A&B, COVID)
Influenza A by PCR: NEGATIVE
Influenza B by PCR: NEGATIVE
SARS Coronavirus 2 by RT PCR: NEGATIVE

## 2019-06-14 LAB — COMPREHENSIVE METABOLIC PANEL
ALT: 23 U/L (ref 0–44)
AST: 24 U/L (ref 15–41)
Albumin: 3 g/dL — ABNORMAL LOW (ref 3.5–5.0)
Alkaline Phosphatase: 81 U/L (ref 38–126)
Anion gap: 15 (ref 5–15)
BUN: 9 mg/dL (ref 8–23)
CO2: 27 mmol/L (ref 22–32)
Calcium: 8.5 mg/dL — ABNORMAL LOW (ref 8.9–10.3)
Chloride: 97 mmol/L — ABNORMAL LOW (ref 98–111)
Creatinine, Ser: 0.71 mg/dL (ref 0.44–1.00)
GFR calc Af Amer: >60 mL/min (ref >60)
GFR calc non Af Amer: >60 mL/min (ref >60)
Glucose, Bld: 135 mg/dL — ABNORMAL HIGH (ref 70–99)
Potassium: 2.8 mmol/L — ABNORMAL LOW (ref 3.5–5.1)
Sodium: 139 mmol/L (ref 135–145)
Total Bilirubin: 0.3 mg/dL (ref 0.3–1.2)
Total Protein: 7.6 g/dL (ref 6.5–8.1)

## 2019-06-14 LAB — CBC WITH DIFFERENTIAL/PLATELET
Abs Immature Granulocytes: 0.14 10*3/uL — ABNORMAL HIGH (ref 0.00–0.07)
Basophils Absolute: 0.1 10*3/uL (ref 0.0–0.1)
Basophils Relative: 0 %
Eosinophils Absolute: 0.3 10*3/uL (ref 0.0–0.5)
Eosinophils Relative: 3 %
HCT: 36 % (ref 36.0–46.0)
Hemoglobin: 10.8 g/dL — ABNORMAL LOW (ref 12.0–15.0)
Immature Granulocytes: 1 %
Lymphocytes Relative: 18 %
Lymphs Abs: 2.1 10*3/uL (ref 0.7–4.0)
MCH: 26.6 pg (ref 26.0–34.0)
MCHC: 30 g/dL (ref 30.0–36.0)
MCV: 88.7 fL (ref 80.0–100.0)
Monocytes Absolute: 0.8 10*3/uL (ref 0.1–1.0)
Monocytes Relative: 7 %
Neutro Abs: 8 10*3/uL — ABNORMAL HIGH (ref 1.7–7.7)
Neutrophils Relative %: 71 %
Platelets: 466 10*3/uL — ABNORMAL HIGH (ref 150–400)
RBC: 4.06 MIL/uL (ref 3.87–5.11)
RDW: 19 % — ABNORMAL HIGH (ref 11.5–15.5)
WBC: 11.4 10*3/uL — ABNORMAL HIGH (ref 4.0–10.5)
nRBC: 0.2 % (ref 0.0–0.2)

## 2019-06-14 LAB — BASIC METABOLIC PANEL
Anion gap: 12 (ref 5–15)
BUN: 8 mg/dL (ref 8–23)
CO2: 30 mmol/L (ref 22–32)
Calcium: 8.2 mg/dL — ABNORMAL LOW (ref 8.9–10.3)
Chloride: 97 mmol/L — ABNORMAL LOW (ref 98–111)
Creatinine, Ser: 0.96 mg/dL (ref 0.44–1.00)
GFR calc Af Amer: >60 mL/min (ref >60)
GFR calc non Af Amer: >60 mL/min (ref >60)
Glucose, Bld: 307 mg/dL — ABNORMAL HIGH (ref 70–99)
Potassium: 3.2 mmol/L — ABNORMAL LOW (ref 3.5–5.1)
Sodium: 139 mmol/L (ref 135–145)

## 2019-06-14 LAB — POC SARS CORONAVIRUS 2 AG -  ED: SARS Coronavirus 2 Ag: NEGATIVE

## 2019-06-14 LAB — TROPONIN I (HIGH SENSITIVITY)
Troponin I (High Sensitivity): 15 ng/L (ref <18)
Troponin I (High Sensitivity): 15 ng/L (ref <18)

## 2019-06-14 LAB — BRAIN NATRIURETIC PEPTIDE: B Natriuretic Peptide: 43.7 pg/mL (ref 0.0–100.0)

## 2019-06-14 LAB — LACTIC ACID, PLASMA: Lactic Acid, Venous: 1.1 mmol/L (ref 0.5–1.9)

## 2019-06-14 LAB — MAGNESIUM
Magnesium: 1.2 mg/dL — ABNORMAL LOW (ref 1.7–2.4)
Magnesium: 1.6 mg/dL — ABNORMAL LOW (ref 1.7–2.4)

## 2019-06-14 MED ORDER — MAGNESIUM SULFATE 2 GM/50ML IV SOLN
2.0000 g | Freq: Once | INTRAVENOUS | Status: AC
  Administered 2019-06-14: 2 g via INTRAVENOUS
  Filled 2019-06-14: qty 50

## 2019-06-14 MED ORDER — HYDROXYZINE PAMOATE 25 MG PO CAPS: 25.0000 mg | ORAL_CAPSULE | Freq: Two times a day (BID) | ORAL | Status: DC | PRN

## 2019-06-14 MED ORDER — ACETAMINOPHEN 325 MG PO TABS
650.0000 mg | ORAL_TABLET | Freq: Four times a day (QID) | ORAL | Status: DC | PRN
  Administered 2019-06-14 – 2019-06-16 (×4): 650 mg via ORAL
  Filled 2019-06-14 (×4): qty 2

## 2019-06-14 MED ORDER — VANCOMYCIN HCL 1250 MG/250ML IV SOLN
1250.0000 mg | Freq: Two times a day (BID) | INTRAVENOUS | Status: DC
  Administered 2019-06-14: 1250 mg via INTRAVENOUS
  Filled 2019-06-14 (×2): qty 250

## 2019-06-14 MED ORDER — CYCLOSPORINE 0.05 % OP EMUL
1.0000 [drp] | Freq: Two times a day (BID) | OPHTHALMIC | Status: DC
  Administered 2019-06-14 – 2019-06-16 (×4): 1 [drp] via OPHTHALMIC
  Filled 2019-06-14 (×6): qty 30

## 2019-06-14 MED ORDER — FLECAINIDE ACETATE 100 MG PO TABS
100.0000 mg | ORAL_TABLET | Freq: Two times a day (BID) | ORAL | Status: DC
  Administered 2019-06-14 – 2019-06-16 (×5): 100 mg via ORAL
  Filled 2019-06-14 (×7): qty 1

## 2019-06-14 MED ORDER — HYDROXYZINE HCL 25 MG PO TABS
25.0000 mg | ORAL_TABLET | Freq: Two times a day (BID) | ORAL | Status: DC | PRN
  Administered 2019-06-14 – 2019-06-15 (×3): 25 mg via ORAL
  Filled 2019-06-14 (×3): qty 1

## 2019-06-14 MED ORDER — PREDNISONE 20 MG PO TABS: 50.0000 mg | ORAL_TABLET | Freq: Every day | ORAL | Status: DC

## 2019-06-14 MED ORDER — SODIUM CHLORIDE 0.9 % IV SOLN
2.0000 g | Freq: Once | INTRAVENOUS | Status: AC
  Administered 2019-06-14: 2 g via INTRAVENOUS
  Filled 2019-06-14: qty 2

## 2019-06-14 MED ORDER — SODIUM CHLORIDE 0.9 % IV SOLN
2.0000 g | Freq: Three times a day (TID) | INTRAVENOUS | Status: DC
  Administered 2019-06-14 – 2019-06-15 (×2): 2 g via INTRAVENOUS
  Filled 2019-06-14 (×5): qty 2

## 2019-06-14 MED ORDER — ALBUTEROL SULFATE (2.5 MG/3ML) 0.083% IN NEBU: 2.5000 mg | INHALATION_SOLUTION | Freq: Four times a day (QID) | RESPIRATORY_TRACT | Status: DC | PRN

## 2019-06-14 MED ORDER — VANCOMYCIN HCL 2000 MG/400ML IV SOLN
2000.0000 mg | Freq: Once | INTRAVENOUS | Status: AC
  Administered 2019-06-14: 2000 mg via INTRAVENOUS
  Filled 2019-06-14: qty 400

## 2019-06-14 MED ORDER — IPRATROPIUM-ALBUTEROL 0.5-2.5 (3) MG/3ML IN SOLN
3.0000 mL | Freq: Once | RESPIRATORY_TRACT | Status: AC
  Administered 2019-06-14: 3 mL via RESPIRATORY_TRACT
  Filled 2019-06-14: qty 3

## 2019-06-14 MED ORDER — FUROSEMIDE 20 MG PO TABS
40.0000 mg | ORAL_TABLET | Freq: Every day | ORAL | Status: DC
  Administered 2019-06-14: 40 mg via ORAL
  Filled 2019-06-14: qty 2

## 2019-06-14 MED ORDER — SODIUM CHLORIDE 0.9 % IV SOLN
INTRAVENOUS | Status: DC | PRN
  Administered 2019-06-14: 250 mL via INTRAVENOUS

## 2019-06-14 MED ORDER — ALBUTEROL SULFATE (2.5 MG/3ML) 0.083% IN NEBU
5.0000 mg | INHALATION_SOLUTION | Freq: Once | RESPIRATORY_TRACT | Status: AC
  Administered 2019-06-14: 5 mg via RESPIRATORY_TRACT
  Filled 2019-06-14: qty 6

## 2019-06-14 MED ORDER — VENLAFAXINE HCL ER 75 MG PO CP24
150.0000 mg | ORAL_CAPSULE | Freq: Every day | ORAL | Status: DC
  Administered 2019-06-15 – 2019-06-16 (×2): 150 mg via ORAL
  Filled 2019-06-14 (×2): qty 2

## 2019-06-14 MED ORDER — IPRATROPIUM BROMIDE HFA 17 MCG/ACT IN AERS
2.0000 | INHALATION_SPRAY | Freq: Once | RESPIRATORY_TRACT | Status: DC
  Filled 2019-06-14: qty 12.9

## 2019-06-14 MED ORDER — ACETAMINOPHEN 325 MG PO TABS
650.0000 mg | ORAL_TABLET | Freq: Once | ORAL | Status: AC
  Administered 2019-06-14: 650 mg via ORAL
  Filled 2019-06-14: qty 2

## 2019-06-14 MED ORDER — DILTIAZEM HCL ER COATED BEADS 120 MG PO CP24
120.0000 mg | ORAL_CAPSULE | Freq: Every day | ORAL | Status: DC
  Administered 2019-06-14 – 2019-06-16 (×3): 120 mg via ORAL
  Filled 2019-06-14 (×3): qty 1

## 2019-06-14 MED ORDER — IPRATROPIUM-ALBUTEROL 0.5-2.5 (3) MG/3ML IN SOLN
3.0000 mL | RESPIRATORY_TRACT | Status: DC
  Administered 2019-06-14 – 2019-06-15 (×4): 3 mL via RESPIRATORY_TRACT
  Filled 2019-06-14 (×4): qty 3

## 2019-06-14 MED ORDER — FUROSEMIDE 10 MG/ML IJ SOLN
80.0000 mg | Freq: Once | INTRAMUSCULAR | Status: AC
  Administered 2019-06-14: 80 mg via INTRAVENOUS
  Filled 2019-06-14: qty 8

## 2019-06-14 MED ORDER — POTASSIUM CHLORIDE CRYS ER 20 MEQ PO TBCR
40.0000 meq | EXTENDED_RELEASE_TABLET | Freq: Once | ORAL | Status: AC
  Administered 2019-06-14: 40 meq via ORAL
  Filled 2019-06-14: qty 2

## 2019-06-14 MED ORDER — APIXABAN 5 MG PO TABS
5.0000 mg | ORAL_TABLET | Freq: Two times a day (BID) | ORAL | Status: DC
  Administered 2019-06-14 – 2019-06-16 (×5): 5 mg via ORAL
  Filled 2019-06-14 (×6): qty 1

## 2019-06-14 MED ORDER — PANTOPRAZOLE SODIUM 40 MG PO TBEC
40.0000 mg | DELAYED_RELEASE_TABLET | Freq: Every day | ORAL | Status: DC
  Administered 2019-06-14 – 2019-06-16 (×3): 40 mg via ORAL
  Filled 2019-06-14 (×3): qty 1

## 2019-06-14 MED ORDER — FAMOTIDINE 20 MG PO TABS
20.0000 mg | ORAL_TABLET | Freq: Every day | ORAL | Status: DC
  Administered 2019-06-14 – 2019-06-16 (×3): 20 mg via ORAL
  Filled 2019-06-14 (×3): qty 1

## 2019-06-14 MED ORDER — METOPROLOL TARTRATE 12.5 MG HALF TABLET
12.5000 mg | ORAL_TABLET | Freq: Two times a day (BID) | ORAL | Status: DC
  Administered 2019-06-14 – 2019-06-16 (×4): 12.5 mg via ORAL
  Filled 2019-06-14 (×5): qty 1

## 2019-06-14 MED ORDER — ALBUTEROL SULFATE (2.5 MG/3ML) 0.083% IN NEBU
2.5000 mg | INHALATION_SOLUTION | RESPIRATORY_TRACT | Status: DC | PRN
  Administered 2019-06-14: 2.5 mg via RESPIRATORY_TRACT
  Filled 2019-06-14: qty 3

## 2019-06-14 MED ORDER — IPRATROPIUM-ALBUTEROL 0.5-2.5 (3) MG/3ML IN SOLN: 3.0000 mL | RESPIRATORY_TRACT | Status: DC | PRN

## 2019-06-14 MED ORDER — LORATADINE 10 MG PO TABS
10.0000 mg | ORAL_TABLET | Freq: Every day | ORAL | Status: DC
  Administered 2019-06-14 – 2019-06-16 (×3): 10 mg via ORAL
  Filled 2019-06-14 (×3): qty 1

## 2019-06-14 MED ORDER — FUROSEMIDE 20 MG PO TABS: 40.0000 mg | ORAL_TABLET | Freq: Every day | ORAL | Status: DC

## 2019-06-14 MED ORDER — METHYLPREDNISOLONE SODIUM SUCC 125 MG IJ SOLR
125.0000 mg | Freq: Once | INTRAMUSCULAR | Status: AC
  Administered 2019-06-14: 125 mg via INTRAVENOUS
  Filled 2019-06-14: qty 2

## 2019-06-14 MED ORDER — MONTELUKAST SODIUM 10 MG PO TABS
10.0000 mg | ORAL_TABLET | Freq: Every day | ORAL | Status: DC
  Administered 2019-06-14 – 2019-06-15 (×2): 10 mg via ORAL
  Filled 2019-06-14 (×3): qty 1

## 2019-06-14 MED ORDER — PREDNISONE 50 MG PO TABS
50.0000 mg | ORAL_TABLET | Freq: Every day | ORAL | Status: DC
  Administered 2019-06-15: 50 mg via ORAL
  Filled 2019-06-14: qty 1

## 2019-06-14 MED ORDER — MOMETASONE FURO-FORMOTEROL FUM 100-5 MCG/ACT IN AERO
2.0000 | INHALATION_SPRAY | Freq: Two times a day (BID) | RESPIRATORY_TRACT | Status: DC
  Administered 2019-06-14 – 2019-06-15 (×2): 2 via RESPIRATORY_TRACT
  Filled 2019-06-14 (×2): qty 8.8

## 2019-06-14 NOTE — ED Notes (Signed)
ED TO INPATIENT HANDOFF REPORT  ED Nurse Name and Phone #:   S Name/Age/Gender [name] White 63 y.o. female Room/Bed: 056C/056C  Code Status   Code Status: DNR  Home/SNF/Other Home Patient oriented to: self, place, time and situation Is this baseline? Yes   Triage Complete: Triage complete  Chief Complaint HAP (hospital-acquired pneumonia) [J18.9, Y95]  Triage Note Patient here from home with shortness of breath.  It started yesterday, progressively getting worse since.  Patient does have history of COPD and CHF.  Patient with audible wheezing heard on arrival.  Patient was given 4 puffs of albuterol en route to ED.  Patient continues with wheezing in upper airway upon arrival.      Allergies Allergies  Allergen Reactions  . Caffeine Other (See Comments)    Migraine  . Hydralazine Hcl     Other reaction(s): Other (See Comments) AKI leading to rhabdo and electrolyte abnormalities  . Hydrocodone Nausea And Vomiting    Headache  . Ciprofloxacin Hives and Rash  . Erythromycin Hives and Rash  . Lisinopril Cough  . Oxycodone Nausea And Vomiting    Headache  . Sulfamethoxazole-Trimethoprim Rash    Level of Care/Admitting Diagnosis ED Disposition    ED Disposition Condition Comment   Admit  Hospital Area: [facility] [[id number]]  Level of Care: Telemetry Medical [[id number]]  Covid Evaluation: Confirmed COVID Negative  Diagnosis: HAP (hospital-acquired pneumonia) [209470]  Admitting Physician: Kathrene Alu [[id number]]  Attending Physician: Martyn Malay [[id number]]  Estimated length of stay: past midnight tomorrow  Certification:: I certify this patient will need inpatient services for at least 2 midnights       B Medical/Surgery History Past Medical History:  Diagnosis Date  . Abdominal wall hematoma 03/06/2019  . Acute bronchitis 08/29/2018  . Acute on chronic respiratory failure with hypoxia ([place]) 08/29/2018  . Anxiety   . Asthma   .  Chronic lower back pain   . COPD (chronic obstructive pulmonary disease) ([place])   . Depression   . Exposure to COVID-19 virus 11/02/2018  . Fall 02/2019  . Family history of anesthesia complication    "daughter; causes her to pass out afterwards"  . GERD (gastroesophageal reflux disease)   . History of atrial flutter 06/26/2015  . History of pulmonary embolus (PE) 11/03/2014  . Hypertension   . Hyperthyroidism   . Left medial tibial stress syndrome 12/26/2017  . Lower GI bleed   . Migraine    "monthly" (12/28/2013)  . Non-traumatic rhabdomyolysis 11/03/2014  . Obesity hypoventilation syndrome ([place]) 03/06/2019  . Osteoarthritis    "both knees; back of my neck; right pelvic bone" (12/28/2013)  . Paroxysmal A-fib ([place])   . Pulmonary embolism ([employer]) 12/28/2013   "2 clots in each lung"   Past Surgical History:  Procedure Laterality Date  . ABDOMINAL HYSTERECTOMY    . APPENDECTOMY    . BREAST CYST EXCISION Right   . DILATION AND CURETTAGE OF UTERUS    . ELECTROPHYSIOLOGIC STUDY N/A 05/27/2015   Procedure: A-Flutter;  Surgeon: Evans Lance, MD;  Location: [facility];  Service: Cardiovascular;  Laterality: N/A;  . EXCISIONAL HEMORRHOIDECTOMY    . NASAL SINUS SURGERY  2007  . THYROIDECTOMY, PARTIAL Right 2005  . TUBAL LIGATION    . WISDOM TOOTH EXTRACTION       A IV Location/Drains/Wounds Patient Lines/Drains/Airways Status   Active Line/Drains/Airways    Name:   Placement date:   Placement time:   Site:  Days:   Peripheral IV 06/14/19 Left Forearm   06/14/19    0656    Forearm   less than 1          Intake/Output Last 24 hours  Intake/Output Summary (Last 24 hours) at 06/14/2019 1556 Last data filed at 06/14/2019 1013 Gross per 24 hour  Intake 152.5 ml  Output --  Net 152.5 ml    Labs/Imaging Results for orders placed or performed during the hospital encounter of 06/14/19 (from the past 48 hour(s))  Comprehensive metabolic panel     Status: Abnormal   Collection Time:  06/14/19  6:52 AM  Result Value Ref Range   Sodium 139 135 - 145 mmol/L   Potassium 2.8 (L) 3.5 - 5.1 mmol/L   Chloride 97 (L) 98 - 111 mmol/L   CO2 27 22 - 32 mmol/L   Glucose, Bld 135 (H) 70 - 99 mg/dL   BUN 9 8 - 23 mg/dL   Creatinine, Ser 0.71 0.44 - 1.00 mg/dL   Calcium 8.5 (L) 8.9 - 10.3 mg/dL   Total Protein 7.6 6.5 - 8.1 g/dL   Albumin 3.0 (L) 3.5 - 5.0 g/dL   AST 24 15 - 41 U/L   ALT 23 0 - 44 U/L   Alkaline Phosphatase 81 38 - 126 U/L   Total Bilirubin 0.3 0.3 - 1.2 mg/dL   GFR calc non Af Amer >60 >60 mL/min   GFR calc Af Amer >60 >60 mL/min   Anion gap 15 5 - 15    Comment: Performed at [facility], [address] [place], [place] [address]  Brain natriuretic peptide     Status: None   Collection Time: 06/14/19  6:52 AM  Result Value Ref Range   B Natriuretic Peptide 43.7 0.0 - 100.0 pg/mL    Comment: Performed at [facility] [address], [place], [place] [address]  Troponin I (High Sensitivity)     Status: None   Collection Time: 06/14/19  6:52 AM  Result Value Ref Range   Troponin I (High Sensitivity) 15 <18 ng/L    Comment: (NOTE) Elevated high sensitivity troponin I (hsTnI) values and significant  changes across serial measurements may suggest ACS but many other  chronic and acute conditions are known to elevate hsTnI results.  Refer to the "Links" section for chest pain algorithms and additional  guidance. Performed at [facility], [name] [address], [place], [place] [address]   Magnesium     Status: Abnormal   Collection Time: 06/14/19  6:52 AM  Result Value Ref Range   Magnesium 1.2 (L) 1.7 - 2.4 mg/dL    Comment: Performed at [facility] [address], [place], [place] [address]  CBC with Differential     Status: Abnormal   Collection Time: 06/14/19  6:52 AM  Result Value Ref Range   WBC 11.4 (H) 4.0 - 10.5 K/uL   RBC 4.06 3.87 - 5.11 MIL/uL   Hemoglobin 10.8 (L) 12.0 - 15.0 g/dL   HCT 36.0 36.0 - 46.0 %   MCV 88.7  80.0 - 100.0 fL   MCH 26.6 26.0 - 34.0 pg   MCHC 30.0 30.0 - 36.0 g/dL   RDW 19.0 (H) 11.5 - 15.5 %   Platelets 466 (H) 150 - 400 K/uL   nRBC 0.2 0.0 - 0.2 %   Neutrophils Relative % 71 %   Neutro Abs 8.0 (H) 1.7 - 7.7 K/uL   Lymphocytes  Relative 18 %   Lymphs Abs 2.1 0.7 - 4.0 K/uL   Monocytes Relative 7 %   Monocytes Absolute 0.8 0.1 - 1.0 K/uL   Eosinophils Relative 3 %   Eosinophils Absolute 0.3 0.0 - 0.5 K/uL   Basophils Relative 0 %   Basophils Absolute 0.1 0.0 - 0.1 K/uL   Immature Granulocytes 1 %   Abs Immature Granulocytes 0.14 (H) 0.00 - 0.07 K/uL    Comment: Performed at [facility] [address] [place], [place] [address]  POC SARS Coronavirus 2 Ag-ED - Nasal Swab (BD Veritor Kit)     Status: None   Collection Time: 06/14/19  7:45 AM  Result Value Ref Range   SARS Coronavirus 2 Ag NEGATIVE NEGATIVE    Comment: (NOTE) SARS-CoV-2 antigen NOT DETECTED.  Negative results are presumptive.  Negative results do not preclude SARS-CoV-2 infection and should not be used as the sole basis for treatment or other patient management decisions, including infection  control decisions, particularly in the presence of clinical signs and  symptoms consistent with COVID-19, or in those who have been in contact with the virus.  Negative results must be combined with clinical observations, patient history, and epidemiological information. The expected result is Negative. Fact Sheet for Patients: PodPark.tn Fact Sheet for Healthcare Providers: GiftContent.is This test is not yet approved or cleared by the [place] FDA and  has been authorized for detection and/or diagnosis of SARS-CoV-2 by FDA under an Emergency Use Authorization (EUA).  This EUA will remain in effect (meaning this test can be used) for the duration of  the COVID-19 de claration under Section 564(b)(1) of the Act, 21 U.S.C. section 360bbb-3(b)(1),  unless the authorization is terminated or revoked sooner.   Lactic acid, plasma     Status: None   Collection Time: 06/14/19  8:36 AM  Result Value Ref Range   Lactic Acid, Venous 1.1 0.5 - 1.9 mmol/L    Comment: Performed at [facility] [address] [place], [place] [address]  Respiratory Panel by RT PCR (Flu A&B, Covid) - Nasopharyngeal Swab     Status: None   Collection Time: 06/14/19  8:40 AM   Specimen: Nasopharyngeal Swab  Result Value Ref Range   SARS Coronavirus 2 by RT PCR NEGATIVE NEGATIVE    Comment: (NOTE) SARS-CoV-2 target nucleic acids are NOT DETECTED. The SARS-CoV-2 RNA is generally detectable in upper respiratoy specimens during the acute phase of infection. The lowest concentration of SARS-CoV-2 viral copies this assay can detect is 131 copies/mL. A negative result does not preclude SARS-Cov-2 infection and should not be used as the sole basis for treatment or other patient management decisions. A negative result may occur with  improper specimen collection/handling, submission of specimen other than nasopharyngeal swab, presence of viral mutation(s) within the areas targeted by this assay, and inadequate number of viral copies (<131 copies/mL). A negative result must be combined with clinical observations, patient history, and epidemiological information. The expected result is Negative. Fact Sheet for Patients:  PinkCheek.be Fact Sheet for Healthcare Providers:  GravelBags.it This test is not yet ap proved or cleared by the [place] FDA and  has been authorized for detection and/or diagnosis of SARS-CoV-2 by FDA under an Emergency Use Authorization (EUA). This EUA will remain  in effect (meaning this test can be used) for the duration of the COVID-19 declaration under Section 564(b)(1) of the Act, 21 U.S.C. section 360bbb-3(b)(1), unless the authorization is terminated or  revoked  sooner.    Influenza A by PCR NEGATIVE NEGATIVE   Influenza B by PCR NEGATIVE NEGATIVE    Comment: (NOTE) The Xpert Xpress SARS-CoV-2/FLU/RSV assay is intended as an aid in  the diagnosis of influenza from Nasopharyngeal swab specimens and  should not be used as a sole basis for treatment. Nasal washings and  aspirates are unacceptable for Xpert Xpress SARS-CoV-2/FLU/RSV  testing. Fact Sheet for Patients: PinkCheek.be Fact Sheet for Healthcare Providers: GravelBags.it This test is not yet approved or cleared by the [place] FDA and  has been authorized for detection and/or diagnosis of SARS-CoV-2 by  FDA under an Emergency Use Authorization (EUA). This EUA will remain  in effect (meaning this test can be used) for the duration of the  Covid-19 declaration under Section 564(b)(1) of the Act, 21  U.S.C. section 360bbb-3(b)(1), unless the authorization is  terminated or revoked. Performed at [facility], [place] [address], [place], [place] [address]   Troponin I (High Sensitivity)     Status: None   Collection Time: 06/14/19  9:04 AM  Result Value Ref Range   Troponin I (High Sensitivity) 15 <18 ng/L    Comment: (NOTE) Elevated high sensitivity troponin I (hsTnI) values and significant  changes across serial measurements may suggest ACS but many other  chronic and acute conditions are known to elevate hsTnI results.  Refer to the "Links" section for chest pain algorithms and additional  guidance. Performed at [facility], [place] [address], [place], [place] [address]   Culture, blood (routine x 2)     Status: None (Preliminary result)   Collection Time: 06/14/19  9:07 AM   Specimen: BLOOD  Result Value Ref Range   Specimen Description BLOOD SITE NOT SPECIFIED    Special Requests      BOTTLES DRAWN AEROBIC AND ANAEROBIC Blood Culture results may not be optimal due to an excessive volume of blood received in  culture bottles   Culture      NO GROWTH < 12 HOURS Performed at [facility] [address], [place], [place] [address]    Report Status PENDING   Culture, blood (routine x 2)     Status: None (Preliminary result)   Collection Time: 06/14/19  9:07 AM   Specimen: BLOOD  Result Value Ref Range   Specimen Description BLOOD SITE NOT SPECIFIED    Special Requests      BOTTLES DRAWN AEROBIC AND ANAEROBIC Blood Culture adequate volume   Culture      NO GROWTH < 12 HOURS Performed at [facility], [place] [address] [place], [place] [address]    Report Status PENDING    DG Chest Portable 1 View  Result Date: 06/14/2019 CLINICAL DATA:  Onset shortness of breath yesterday. EXAM: PORTABLE CHEST 1 VIEW COMPARISON:  Single-view of the chest 05/28/2019. CT chest 03/14/2019. FINDINGS: Lung volumes are low and there is elevation of the right hemidiaphragm. No consolidative process, pneumothorax or effusion is identified. Heart size is mildly enlarged. Aortic atherosclerosis is noted. IMPRESSION: No acute finding in a low volume chest. Atherosclerosis. Mild cardiomegaly. Electronically Signed   By: Inge Rise M.D.   On: 06/14/2019 07:39    Pending Labs Unresulted Labs (From admission, onward)    Start     Ordered   06/15/19 6578  Basic metabolic panel  Tomorrow morning,   R     06/14/19 1153   06/15/19 0500  CBC  Tomorrow morning,  R     06/14/19 1153   06/15/19 0500  Magnesium  Tomorrow morning,   R     06/14/19 1435   06/14/19 2426  Basic metabolic panel  Once,   STAT     06/14/19 1209   06/14/19 1400  Magnesium  Once,   STAT     06/14/19 1246   06/14/19 1246  MRSA PCR Screening  Once,   STAT    Question:  Patient immune status  Answer:  Normal   06/14/19 1245   06/14/19 0745  Urinalysis, Routine w reflex microscopic  ONCE - STAT,   STAT     06/14/19 0744          Vitals/Pain Today's Vitals   06/14/19 1019 06/14/19 1405 06/14/19 1515 06/14/19 1545  BP:   132/86    Pulse: (!) 104  (!) 114 (!) 114  Resp: (!) 25  (!) 34 20  Temp:      TempSrc:      SpO2: 96%  94% 94%  Weight:      Height:      PainSc:  0-No pain      Isolation Precautions Airborne and Contact precautions  Medications Medications  ipratropium (ATROVENT HFA) inhaler 2 puff (2 puffs Inhalation Not Given 06/14/19 0756)  vancomycin (VANCOREADY) IVPB 2000 mg/400 mL (2,000 mg Intravenous New Bag/Given 06/14/19 1437)  vancomycin (VANCOREADY) IVPB 1250 mg/250 mL (has no administration in time range)  cycloSPORINE (RESTASIS) 0.05 % ophthalmic emulsion 1 drop (has no administration in time range)  acetaminophen (TYLENOL) tablet 650 mg (650 mg Oral Given 06/14/19 1434)  flecainide (TAMBOCOR) tablet 100 mg (100 mg Oral Given 06/14/19 1435)  loratadine (CLARITIN) tablet 10 mg (10 mg Oral Given 06/14/19 1433)  apixaban (ELIQUIS) tablet 5 mg (5 mg Oral Given 06/14/19 1436)  pantoprazole (PROTONIX) EC tablet 40 mg (40 mg Oral Given 06/14/19 1435)  montelukast (SINGULAIR) tablet 10 mg (has no administration in time range)  metoprolol tartrate (LOPRESSOR) tablet 12.5 mg (12.5 mg Oral Given 06/14/19 1435)  mometasone-formoterol (DULERA) 100-5 MCG/ACT inhaler 2 puff (2 puffs Inhalation Not Given 06/14/19 1154)  famotidine (PEPCID) tablet 20 mg (20 mg Oral Given 06/14/19 1433)  diltiazem (CARDIZEM CD) 24 hr capsule 120 mg (120 mg Oral Given 06/14/19 1437)  ceFEPIme (MAXIPIME) 2 g in sodium chloride 0.9 % 100 mL IVPB (has no administration in time range)  albuterol (PROVENTIL) (2.5 MG/3ML) 0.083% nebulizer solution 2.5 mg (2.5 mg Nebulization Given 06/14/19 1450)  hydrOXYzine (ATARAX/VISTARIL) tablet 25 mg (has no administration in time range)  predniSONE (DELTASONE) tablet 50 mg (has no administration in time range)  ipratropium-albuterol (DUONEB) 0.5-2.5 (3) MG/3ML nebulizer solution 3 mL (3 mLs Nebulization Not Given 06/14/19 1503)  venlafaxine XR (EFFEXOR-XR) 24 hr capsule 150 mg (has no  administration in time range)  albuterol (PROVENTIL) (2.5 MG/3ML) 0.083% nebulizer solution 5 mg (5 mg Nebulization Given 06/14/19 0755)  methylPREDNISolone sodium succinate (SOLU-MEDROL) 125 mg/2 mL injection 125 mg (125 mg Intravenous Given 06/14/19 0833)  magnesium sulfate IVPB 2 g 50 mL (0 g Intravenous Stopped 06/14/19 0941)  acetaminophen (TYLENOL) tablet 650 mg (650 mg Oral Given 06/14/19 0839)  ceFEPIme (MAXIPIME) 2 g in sodium chloride 0.9 % 100 mL IVPB (0 g Intravenous Stopped 06/14/19 1013)  potassium chloride SA (KLOR-CON) CR tablet 40 mEq (40 mEq Oral Given 06/14/19 1449)  ipratropium-albuterol (DUONEB) 0.5-2.5 (3) MG/3ML nebulizer solution 3 mL (3 mLs Nebulization Given 06/14/19 1019)  furosemide (LASIX) injection 80 mg (80  mg Intravenous Given 06/14/19 1450)    Mobility non-ambulatory Moderate fall risk   Focused Assessments Cardiac Assessment Handoff:    Lab Results  Component Value Date   TROPONINI <0.03 07/01/2018   Lab Results  Component Value Date   DDIMER 1.33 (H) 12/26/2018   Does the Patient currently have chest pain? No  , Pulmonary Assessment Handoff:  Lung sounds: Bilateral Breath Sounds: Diminished L Breath Sounds: Expiratory wheezes, Inspiratory wheezes R Breath Sounds: Expiratory wheezes, Inspiratory wheezes O2 Device: Nasal Cannula O2 Flow Rate (L/min): 3 L/min      R Recommendations: See Admitting Provider Note  Report given to:   Additional Notes:

## 2019-06-14 NOTE — H&P (Addendum)
[facility] Admission History and Physical Service Pager: [phone]  Patient name: [name] White Medical record number: [id number] Date of birth: 05/07/1956 Age: 63 y.o. Gender: female  Primary Care Provider: [name], MD Consultants: none Code Status: DNR Emergency Contact: [name] White, niece, [phone]  Chief Complaint: Shortness of breath  Assessment and Plan: [name] White is a 63 y.o. female presenting with shortness of breath. PMH is significant for HFpEF, COPD/asthma, obesity, OHS not on CPAP, PAF on Eliquis, HTN, MDD, vocal cord dysfunction, H/o VTE in 2015.  Acute on Chronic Respiratory Failure COPD exacerbation with CHF exacerbation Likely COPD exacerbation given patient's fever and WBC count of 11.4, although her CHF and OHS  are likely contributing.  Although she has been hospitalized within the last 30 days, a diagnosis of HAP would require being in the inpatient setting sometime during the last 48 hours, and she does not fulfill this criterion.  However, she received frequent antibiotics, so she is at high risk for antibiotic resistance.  Vancomycin and cefepime were started in the ED, but we will likely de-escalate to ceftriaxone and doxycycline if MRSA PCR is negative.  Patient has been taking all of her medications as prescribed, including her Lasix and Eliquis.  CXR does not show any infiltrates, and Covid testing along with respiratory viral panel were negative.  BNP 43, however with pitting edema up to knees, volume overload contributing.  Last echo 05/2019 with LVEF 60-65% with severely increased left ventricular hypertrophy, recommendation to consider cardiac MRI for investigation of amyloidosis or HCM as etiologies of severe LVH.    In the ED, she received Solu-Medrol, duo nebs, magnesium, and vancomycin/cefepime.   On exam, pulse oximetry is 93% on her home 3 L, although she is unable to complete full sentences due to  shortness of breath.  PE highly unlikely given other more plausible causes of her shortness of breath as well as her compliance with Eliquis. -Admit to medical telemetry, attending Dr. Owens Shark -S/p vancomycin and cefepime, will de-escalate to doxycycline and ceftriaxone if MRSA PCR is negative -S/p Solu-Medrol, start prednisone 50 mg 12/19 x 5 days -Give Lasix 80 mg IV once, dose additionally as needed -Strict I's and O's -Daily weights (patient reports dry weight is about 350 lbs) -DuoNebs every 4 hours with albuterol every 2 hours as needed -CPAP nightly -PT -Continuous pulse ox   -Consider cardiac MRI to look for causes of severe LVH  Hypomagnesemia and hypokalemia BMP in the ED significant for potassium of 2.8, and magnesium was found to be 1.2.  K-Dur was ordered at 0900 but has not yet been given, have contacted nurse regarding this.  Magnesium 2 g given in the ED.  Important for magnesium to remain around 2 since patient is on flecainide.  QTc is 487 on admission. -Recheck BMP and magnesium at 1400 and again in the morning -We will likely need more K-Dur a few hours after first dose is given -Continuous cardiac monitoring -AM EKG  HTN Normotensive on admission, 127/82.  Reports compliance with diltiazem, metoprolol. -Continue home meds  PAF on Eliquis Prior VTE In sinus rhythm on exam.  On diltiazem and metoprolol for rate control.  Slightly tachycardic in ED to low 100s, likely 2/2 duonebs.  Reports compliance with Eliquis.  Noted to have prior PE in 2015, previously on Xarelto but was switched to Eliquis at hospitalization 02/2019. -Continue home Eliquis, diltiazem, and metoprolol  MDD On Effexor but has been out for a  few days. Hydroxyzine prn has not improved symptoms.  Currently, mood and affect euthymic without SI. -Continue home meds  Prior GI bleed Hospitalized 03/05/19-03/07/2019 likely secondary to diverticular bleed as well as abdominal hematoma sustained s/p fall.   Hemoglobin stable today. -Monitor hemoglobin  Prediabetes Last A1c 6.3 03/19/2019, currently not on any chronic antidiabetic medication. -Consider adding Metformin in the outpatient setting.  Vocal cord dysfunction Has previously worked with [facility] with improvement  Prior homelessness Noted per chart review.  Currently living with her niece.  FEN/GI: Heart healthy Prophylaxis: Home Eliquis  Disposition: Medical telemetry  History of Present Illness:  [name] White is a 63 y.o. female presenting with 1 day history of shortness of breath.  She also says that she has felt fatigued and has had a cough for the last day.  She also endorses increased pedal edema and orthopnea over the last 24 hours.  She says that she has to sit straight up in order to breathe adequately.  She says she feels like she felt when she presented earlier this month with a CHF exacerbation.  She lives with her niece who works in a nursing home, but her niece has been asymptomatic and is tested twice weekly for Covid and has been negative.  Ms. control also says that she herself has tested negative for Covid 14 times.  She says that she has been taking all of her medications as prescribed and is very careful to take her Eliquis and Lasix.  She has been using her nebulized albuterol at home every 8 hours without much improvement in her symptoms.  She says that the duo nebs have been most helpful in her symptomatic improvement since being in the ED.  Review Of Systems: Per HPI with the following additions:   Review of Systems  Constitutional: Positive for fever and malaise/fatigue.  HENT: Negative for congestion and sore throat.   Respiratory: Positive for cough, shortness of breath and wheezing. Negative for sputum production.   Cardiovascular: Positive for orthopnea, leg swelling and PND. Negative for chest pain.  Gastrointestinal: Negative for diarrhea, nausea and vomiting.  Genitourinary: Negative for dysuria and  urgency.  Musculoskeletal: Negative for myalgias.  Psychiatric/Behavioral: The patient is not nervous/anxious.     Patient Active Problem List   Diagnosis Date Noted  . Weight gain due to medication 06/05/2019  . Acute respiratory failure with hypoxia ([place]) 05/28/2019  . Bronchitis with acute wheezing 05/09/2019  . Hyperglycemia 03/19/2019  . Acute on chronic respiratory failure with hypoxemia ([place]) 03/14/2019  . Acute on chronic heart failure with preserved ejection fraction ([place]) 03/14/2019  . Acquired thrombophilia ([place]) due to A-Fib 03/07/2019  . Heart failure with preserved ejection fraction ([place]) 03/06/2019  . Obesity hypoventilation syndrome ([place]) 03/06/2019  . Lower GI bleed   . Urge incontinence 01/27/2019  . COPD exacerbation ([place]) 08/29/2018  . COPD with acute exacerbation ([place]) 08/07/2018  . Sleep apnea 07/05/2018  . Chronic respiratory failure with hypoxia ([place]) 07/05/2018  . Severe episode of recurrent major depressive disorder, without psychotic features ([place])   . MDD (major depressive disorder), recurrent episode, severe ([place]) 09/07/2015  . Atrial flutter ([place]) 07/29/2015  . Asthma 11/12/2014  . History of hypertension 11/05/2014  . Paroxysmal atrial fibrillation ([place]) 11/03/2014  . Chronic anticoagulation 11/03/2014  . Major depressive disorder 11/03/2014  . History of pulmonary embolus (PE) 11/03/2014  . MDD (major depressive disorder), recurrent severe, without psychosis ([place]) 03/05/2014  . Vocal cord dysfunction 11/12/2013  . Hot  flashes 04/22/2011  . OVERACTIVE BLADDER 04/18/2008  . Osteoarthritis of both knees 04/18/2008  . Osteoarthritis involving multiple joints on both sides of body 09/14/2007  . Morbid obesity ([place]) 08/24/2006  . RESTLESS LEGS SYNDROME 08/24/2006  . HYPERTENSION, BENIGN SYSTEMIC 08/24/2006  . RHINITIS, ALLERGIC 08/24/2006  . GASTROESOPHAGEAL REFLUX, NO ESOPHAGITIS 08/24/2006    Past Medical History: Past Medical History:   Diagnosis Date  . Abdominal wall hematoma 03/06/2019  . Acute bronchitis 08/29/2018  . Acute on chronic respiratory failure with hypoxia ([place]) 08/29/2018  . Anxiety   . Asthma   . Chronic lower back pain   . COPD (chronic obstructive pulmonary disease) ([place])   . Depression   . Exposure to COVID-19 virus 11/02/2018  . Fall 02/2019  . Family history of anesthesia complication    "daughter; causes her to pass out afterwards"  . GERD (gastroesophageal reflux disease)   . History of atrial flutter 06/26/2015  . History of pulmonary embolus (PE) 11/03/2014  . Hypertension   . Hyperthyroidism   . Left medial tibial stress syndrome 12/26/2017  . Lower GI bleed   . Migraine    "monthly" (12/28/2013)  . Non-traumatic rhabdomyolysis 11/03/2014  . Obesity hypoventilation syndrome ([place]) 03/06/2019  . Osteoarthritis    "both knees; back of my neck; right pelvic bone" (12/28/2013)  . Paroxysmal A-fib ([place])   . Pulmonary embolism ([place]) 12/28/2013   "2 clots in each lung"    Past Surgical History: Past Surgical History:  Procedure Laterality Date  . ABDOMINAL HYSTERECTOMY    . APPENDECTOMY    . BREAST CYST EXCISION Right   . DILATION AND CURETTAGE OF UTERUS    . ELECTROPHYSIOLOGIC STUDY N/A 05/27/2015   Procedure: A-Flutter;  Surgeon: Evans Lance, MD;  Location: [facility];  Service: Cardiovascular;  Laterality: N/A;  . EXCISIONAL HEMORRHOIDECTOMY    . NASAL SINUS SURGERY  2007  . THYROIDECTOMY, PARTIAL Right 2005  . TUBAL LIGATION    . WISDOM TOOTH EXTRACTION      Social History: Social History   Tobacco Use  . Smoking status: Former Smoker    Packs/day: 0.25    Years: 20.00    Pack years: 5.00    Types: Cigarettes    Quit date: 07/17/1999    Years since quitting: 19.9  . Smokeless tobacco: Never Used  Substance Use Topics  . Alcohol use: Not Currently    Comment: "drank some in my 30's"  . Drug use: No   Additional social history:   Please also refer to relevant sections of  EMR.  Family History: Family History  Problem Relation Age of Onset  . Osteoarthritis Mother   . Asthma Mother   . Heart failure Mother   . Breast cancer Daughter    (If not completed, MUST add something in)  Allergies and Medications: Allergies  Allergen Reactions  . Caffeine Other (See Comments)    Migraine  . Hydralazine Hcl     Other reaction(s): Other (See Comments) AKI leading to rhabdo and electrolyte abnormalities  . Hydrocodone Nausea And Vomiting    Headache  . Ciprofloxacin Hives and Rash  . Erythromycin Hives and Rash  . Lisinopril Cough  . Oxycodone Nausea And Vomiting    Headache  . Sulfamethoxazole-Trimethoprim Rash   No current facility-administered medications on file prior to encounter.   Current Outpatient Medications on File Prior to Encounter  Medication Sig Dispense Refill  . acetaminophen (TYLENOL) 325 MG tablet Take 2 tablets (650  mg total) by mouth every 6 (six) hours as needed for mild pain.    Marland Kitchen albuterol (PROVENTIL) (2.5 MG/3ML) 0.083% nebulizer solution Take 3 mLs (2.5 mg total) by nebulization every 6 (six) hours as needed for wheezing or shortness of breath. 75 mL 4  . apixaban (ELIQUIS) 5 MG TABS tablet Take 1 tablet (5 mg total) by mouth 2 (two) times daily. 60 tablet 0  . azelastine (ASTELIN) 0.1 % nasal spray Place 2 sprays into both nostrils 2 (two) times daily. Use in each nostril as directed 30 mL 0  . azelastine (OPTIVAR) 0.05 % ophthalmic solution Place 1 drop into both eyes 2 (two) times daily as needed (itchy eyes).    . benzonatate (TESSALON) 200 MG capsule Take 1 capsule (200 mg total) by mouth 3 (three) times daily as needed for cough. (Patient not taking: Reported on 05/28/2019) 30 capsule 1  . cycloSPORINE (RESTASIS) 0.05 % ophthalmic emulsion Place 1 drop into both eyes 2 (two) times daily.    . diclofenac sodium (VOLTAREN) 1 % GEL APPLY 2 GRAMS EXTERNALLY TO THE AFFECTED AREA FOUR TIMES DAILY (Patient taking differently: Apply 2  g topically 4 (four) times daily as needed (pain/ shoulder & right theigh). ) 100 g 0  . diltiazem (CARDIZEM CD) 120 MG 24 hr capsule Take 1 capsule (120 mg total) by mouth daily. 90 capsule 0  . famotidine (PEPCID) 20 MG tablet Take 1 tablet (20 mg total) by mouth daily. 90 tablet 0  . flecainide (TAMBOCOR) 100 MG tablet TAKE 1 TABLET BY MOUTH 2 TIMES A DAY. PLEASE KEEP UPCOMING APPOINTMENT IN JANUARY BEFORE ANYMORE REFILLS. (Patient taking differently: Take 100 mg by mouth 2 (two) times daily. ) 60 tablet 6  . fluticasone (FLONASE) 50 MCG/ACT nasal spray Place 2 sprays into both nostrils daily.     . furosemide (LASIX) 40 MG tablet Take 1 tablet (40 mg total) by mouth daily. 30 tablet 0  . furosemide (LASIX) 40 MG tablet Take 1 tablet (40 mg total) by mouth daily. 30 tablet 0  . guaiFENesin-dextromethorphan (ROBITUSSIN DM) 100-10 MG/5ML syrup Take 5 mLs by mouth every 4 (four) hours as needed for cough. 118 mL 0  . hydrocortisone (ANUSOL-HC) 25 MG suppository Place 1 suppository (25 mg total) rectally 2 (two) times daily. (Patient taking differently: Place 25 mg rectally 2 (two) times daily as needed for hemorrhoids. ) 12 suppository 0  . hydrOXYzine (VISTARIL) 25 MG capsule Take 1 capsule (25 mg total) by mouth 2 (two) times daily as needed for anxiety. 30 capsule 0  . ipratropium-albuterol (DUONEB) 0.5-2.5 (3) MG/3ML SOLN Take 3 mLs by nebulization 3 (three) times daily. 360 mL 3  . loratadine (CLARITIN) 10 MG tablet TAKE 1 TABLET(10 MG) BY MOUTH DAILY (Patient taking differently: Take 10 mg by mouth daily. ) 90 tablet 0  . metoprolol tartrate (LOPRESSOR) 25 MG tablet Take 0.5 tablets (12.5 mg total) by mouth 2 (two) times daily. 60 tablet 0  . mometasone-formoterol (DULERA) 100-5 MCG/ACT AERO Inhale 2 puffs into the lungs 2 (two) times daily. 13 g 0  . montelukast (SINGULAIR) 10 MG tablet Take 1 tablet (10 mg total) by mouth at bedtime. 30 tablet 0  . omeprazole (PRILOSEC) 20 MG capsule Take 1  capsule (20 mg total) by mouth daily. 30 capsule 0  . OXYGEN Inhale 2 L into the lungs continuous.     . potassium chloride SA (K-DUR) 20 MEQ tablet Take 1 tablet (20 mEq total) by mouth  daily. (Patient taking differently: Take 20 mEq by mouth 2 (two) times daily. ) 30 tablet 0  . venlafaxine XR (EFFEXOR-XR) 150 MG 24 hr capsule Take 1 capsule (150 mg total) by mouth daily with breakfast. Take 150mg  capsule with 37.5mg  tablet    . VENTOLIN HFA 108 (90 Base) MCG/ACT inhaler INHALE 2 PUFFS INTO THE LUNGS EVERY 6 HOURS AS NEEDED FOR WHEEZING OR SHORTNESS OF BREATH 18 g 0    Objective: BP 127/82   Pulse (!) 104   Temp (S) (!) 100.4 F (38 C) (Oral)   Resp (!) 25   Ht 5\' 8"  (1.727 m)   Wt (!) 165 kg   SpO2 96%   BMI 55.31 kg/m  Exam: Physical Exam Constitutional:      Appearance: She is well-developed. She is obese. She is ill-appearing. She is not diaphoretic.  HENT:     Head: Normocephalic and atraumatic.     Mouth/Throat:     Mouth: Mucous membranes are moist.  Eyes:     Extraocular Movements: Extraocular movements intact.  Cardiovascular:     Rate and Rhythm: Tachycardia present.     Heart sounds: No murmur.  Pulmonary:     Effort: Tachypnea and respiratory distress present.     Breath sounds: Examination of the right-upper field reveals decreased breath sounds. Examination of the left-upper field reveals decreased breath sounds. Examination of the right-middle field reveals decreased breath sounds and wheezing. Examination of the left-middle field reveals decreased breath sounds and wheezing. Examination of the right-lower field reveals decreased breath sounds and rales. Examination of the left-lower field reveals decreased breath sounds and rales. Decreased breath sounds, wheezing and rales present. No rhonchi.  Abdominal:     Palpations: Abdomen is soft.  Musculoskeletal:        General: Normal range of motion.     Cervical back: Normal range of motion.     Right lower leg:  Edema present.     Left lower leg: Edema present.  Skin:    General: Skin is warm and dry.  Neurological:     General: No focal deficit present.     Mental Status: She is alert.  Psychiatric:        Mood and Affect: Mood normal.      Labs and Imaging: CBC BMET  Recent Labs  Lab 06/14/19 0652  WBC 11.4*  HGB 10.8*  HCT 36.0  PLT 466*   Recent Labs  Lab 06/14/19 0652  NA 139  K 2.8*  CL 97*  CO2 27  BUN 9  CREATININE 0.71  GLUCOSE 135*  CALCIUM 8.5*       Kindell Strada, Alcario Drought, MD 06/14/2019, 11:34 AM PGY-3, [facility] pager: [phone], text pages welcome

## 2019-06-14 NOTE — Progress Notes (Signed)
   Vital Signs MEWS/VS Documentation      06/14/2019 1545 06/14/2019 1621 06/14/2019 1650 06/14/2019 1651   MEWS Score:  2  2  2  2    MEWS Score Color:  Yellow  Yellow  Yellow  Yellow   Resp:  20  (!) 21  --  (!) 24   Pulse:  (!) 114  (!) 102  --  (!) 109   BP:  --  137/81  --  129/76   Temp:  --  --  --  98.1 F (36.7 C)   O2 Device:  --  --  --  Nasal Cannula   O2 Flow Rate (L/min):  --  --  --  4 L/min   Level of Consciousness:  --  --  Alert  --      Non-acute change.     Jennavecia Schwier L Christien Berthelot 06/14/2019,5:11 PM

## 2019-06-14 NOTE — ED Triage Notes (Signed)
Patient here from home with shortness of breath.  It started yesterday, progressively getting worse since.  Patient does have history of COPD and CHF.  Patient with audible wheezing heard on arrival.  Patient was given 4 puffs of albuterol en route to ED.  Patient continues with wheezing in upper airway upon arrival.

## 2019-06-14 NOTE — ED Provider Notes (Signed)
[name] EMERGENCY DEPARTMENT Provider Note   CSN: [id number] Arrival date & time: 06/14/19  [id number]     History Chief Complaint  Patient presents with  . Shortness of Breath    [name] White is a 63 y.o. female.  HPI   63 year old female with a history of COPD (on chronic 3L), GERD, a flutter, pulmonary embolus (on Eliquis), hypertension, hypothyroidism, obesity hypoventilation syndrome, CHF, who presents the emergency department today for evaluation of shortness of breath.  States that she developed a cough 3 days ago and developed shortness of breath yesterday.  She has been wheezing when she ambulates as well.  Today she noticed some bilateral lower extremity swelling which is new.  She reports some chest tightness when she breathes.  She has some mild pleuritic pain.  She has had no fevers that she is aware of and no other URI symptoms.  She is on Lasix and has been compliant.  She denies any missed doses of Eliquis.  She has been using her nebulizers every 8 hours and has also been using her albuterol inhaler between nebulizer treatments.  She also received 4 puffs of albuterol in route with EMS.  Denies any abdominal pain, nausea, vomiting, diarrhea.  Denies any known coronavirus exposures.  Past Medical History:  Diagnosis Date  . Abdominal wall hematoma 03/06/2019  . Acute bronchitis 08/29/2018  . Acute on chronic respiratory failure with hypoxia ([place]) 08/29/2018  . Anxiety   . Asthma   . Chronic lower back pain   . COPD (chronic obstructive pulmonary disease) ([place])   . Depression   . Exposure to COVID-19 virus 11/02/2018  . Fall 02/2019  . Family history of anesthesia complication    "daughter; causes her to pass out afterwards"  . GERD (gastroesophageal reflux disease)   . History of atrial flutter 06/26/2015  . History of pulmonary embolus (PE) 11/03/2014  . Hypertension   . Hyperthyroidism   . Left medial tibial stress syndrome 12/26/2017  . Lower GI  bleed   . Migraine    "monthly" (12/28/2013)  . Non-traumatic rhabdomyolysis 11/03/2014  . Obesity hypoventilation syndrome ([place]) 03/06/2019  . Osteoarthritis    "both knees; back of my neck; right pelvic bone" (12/28/2013)  . Paroxysmal A-fib ([place])   . Pulmonary embolism ([place]) 12/28/2013   "2 clots in each lung"    Patient Active Problem List   Diagnosis Date Noted  . Weight gain due to medication 06/05/2019  . Acute respiratory failure with hypoxia ([place]) 05/28/2019  . Bronchitis with acute wheezing 05/09/2019  . Hyperglycemia 03/19/2019  . Acute on chronic respiratory failure with hypoxemia ([place]) 03/14/2019  . Acute on chronic heart failure with preserved ejection fraction ([place]) 03/14/2019  . Acquired thrombophilia ([place]) due to A-Fib 03/07/2019  . Heart failure with preserved ejection fraction ([place]) 03/06/2019  . Obesity hypoventilation syndrome ([place]) 03/06/2019  . Lower GI bleed   . Urge incontinence 01/27/2019  . COPD exacerbation ([place]) 08/29/2018  . COPD with acute exacerbation ([place]) 08/07/2018  . Sleep apnea 07/05/2018  . Chronic respiratory failure with hypoxia ([place]) 07/05/2018  . Severe episode of recurrent major depressive disorder, without psychotic features ([place])   . MDD (major depressive disorder), recurrent episode, severe ([place]) 09/07/2015  . Atrial flutter ([place]) 07/29/2015  . Asthma 11/12/2014  . History of hypertension 11/05/2014  . Paroxysmal atrial fibrillation ([place]) 11/03/2014  . Chronic anticoagulation 11/03/2014  . Major depressive disorder 11/03/2014  . History of pulmonary embolus (PE) 11/03/2014  .  MDD (major depressive disorder), recurrent severe, without psychosis ([place]) 03/05/2014  . Vocal cord dysfunction 11/12/2013  . Hot flashes 04/22/2011  . OVERACTIVE BLADDER 04/18/2008  . Osteoarthritis of both knees 04/18/2008  . Osteoarthritis involving multiple joints on both sides of body 09/14/2007  . Morbid obesity ([place]) 08/24/2006  . RESTLESS LEGS SYNDROME  08/24/2006  . HYPERTENSION, BENIGN SYSTEMIC 08/24/2006  . RHINITIS, ALLERGIC 08/24/2006  . GASTROESOPHAGEAL REFLUX, NO ESOPHAGITIS 08/24/2006    Past Surgical History:  Procedure Laterality Date  . ABDOMINAL HYSTERECTOMY    . APPENDECTOMY    . BREAST CYST EXCISION Right   . DILATION AND CURETTAGE OF UTERUS    . ELECTROPHYSIOLOGIC STUDY N/A 05/27/2015   Procedure: A-Flutter;  Surgeon: Evans Lance, MD;  Location: [facility];  Service: Cardiovascular;  Laterality: N/A;  . EXCISIONAL HEMORRHOIDECTOMY    . NASAL SINUS SURGERY  2007  . THYROIDECTOMY, PARTIAL Right 2005  . TUBAL LIGATION    . WISDOM TOOTH EXTRACTION       OB History   No obstetric history on file.     Family History  Problem Relation Age of Onset  . Osteoarthritis Mother   . Asthma Mother   . Heart failure Mother   . Breast cancer Daughter     Social History   Tobacco Use  . Smoking status: Former Smoker    Packs/day: 0.25    Years: 20.00    Pack years: 5.00    Types: Cigarettes    Quit date: 07/17/1999    Years since quitting: 19.9  . Smokeless tobacco: Never Used  Substance Use Topics  . Alcohol use: Not Currently    Comment: "drank some in my 30's"  . Drug use: No    Home Medications Prior to Admission medications   Medication Sig Start Date End Date Taking? Authorizing Provider  acetaminophen (TYLENOL) 325 MG tablet Take 2 tablets (650 mg total) by mouth every 6 (six) hours as needed for mild pain. 09/11/18   Georgette Shell, MD  albuterol (PROVENTIL) (2.5 MG/3ML) 0.083% nebulizer solution Take 3 mLs (2.5 mg total) by nebulization every 6 (six) hours as needed for wheezing or shortness of breath. 07/09/18   Collene Gobble, MD  apixaban (ELIQUIS) 5 MG TABS tablet Take 1 tablet (5 mg total) by mouth 2 (two) times daily. 03/08/19   Kathrene Alu, MD  azelastine (ASTELIN) 0.1 % nasal spray Place 2 sprays into both nostrils 2 (two) times daily. Use in each nostril as directed 06/01/19    Lattie Haw, MD  azelastine (OPTIVAR) 0.05 % ophthalmic solution Place 1 drop into both eyes 2 (two) times daily as needed (itchy eyes).    [provider]  benzonatate (TESSALON) 200 MG capsule Take 1 capsule (200 mg total) by mouth 3 (three) times daily as needed for cough. Patient not taking: Reported on 05/28/2019 05/09/19   Martyn Ehrich, NP  cycloSPORINE (RESTASIS) 0.05 % ophthalmic emulsion Place 1 drop into both eyes 2 (two) times daily.    [provider]  diclofenac sodium (VOLTAREN) 1 % GEL APPLY 2 GRAMS EXTERNALLY TO THE AFFECTED AREA FOUR TIMES DAILY Patient taking differently: Apply 2 g topically 4 (four) times daily as needed (pain/ shoulder & right theigh).  07/31/18   [name], MD  diltiazem (CARDIZEM CD) 120 MG 24 hr capsule Take 1 capsule (120 mg total) by mouth daily. 06/07/19   Patsey Berthold, NP  famotidine (PEPCID) 20 MG tablet Take  1 tablet (20 mg total) by mouth daily. 06/05/19   Anderson, Chelsey L, DO  flecainide (TAMBOCOR) 100 MG tablet TAKE 1 TABLET BY MOUTH 2 TIMES A DAY. PLEASE KEEP UPCOMING APPOINTMENT IN JANUARY BEFORE ANYMORE REFILLS. Patient taking differently: Take 100 mg by mouth 2 (two) times daily.  12/03/18   Seiler, Amber K, NP  fluticasone (FLONASE) 50 MCG/ACT nasal spray Place 2 sprays into both nostrils daily.  10/29/18   [provider]  furosemide (LASIX) 40 MG tablet Take 1 tablet (40 mg total) by mouth daily. 03/22/19 04/21/19  Arrien, Jimmy Picket, MD  furosemide (LASIX) 40 MG tablet Take 1 tablet (40 mg total) by mouth daily. 06/01/19 07/01/19  Rory Percy, DO  guaiFENesin-dextromethorphan (ROBITUSSIN DM) 100-10 MG/5ML syrup Take 5 mLs by mouth every 4 (four) hours as needed for cough. 06/01/19   Lattie Haw, MD  hydrocortisone (ANUSOL-HC) 25 MG suppository Place 1 suppository (25 mg total) rectally 2 (two) times daily. Patient taking differently: Place 25 mg rectally 2 (two) times daily as needed for  hemorrhoids.  08/20/18   [place] Donnamarie Poag, DO  hydrOXYzine (VISTARIL) 25 MG capsule Take 1 capsule (25 mg total) by mouth 2 (two) times daily as needed for anxiety. 06/02/19   Rory Percy, DO  ipratropium-albuterol (DUONEB) 0.5-2.5 (3) MG/3ML SOLN Take 3 mLs by nebulization 3 (three) times daily. 06/01/19   Lattie Haw, MD  loratadine (CLARITIN) 10 MG tablet TAKE 1 TABLET(10 MG) BY MOUTH DAILY Patient taking differently: Take 10 mg by mouth daily.  03/05/19   [name], MD  metoprolol tartrate (LOPRESSOR) 25 MG tablet Take 0.5 tablets (12.5 mg total) by mouth 2 (two) times daily. 06/02/19   Rory Percy, DO  mometasone-formoterol (DULERA) 100-5 MCG/ACT AERO Inhale 2 puffs into the lungs 2 (two) times daily. 06/05/19   Anderson, Chelsey L, DO  montelukast (SINGULAIR) 10 MG tablet Take 1 tablet (10 mg total) by mouth at bedtime. 06/01/19   Lattie Haw, MD  omeprazole (PRILOSEC) 20 MG capsule Take 1 capsule (20 mg total) by mouth daily. 06/01/19   Lattie Haw, MD  OXYGEN Inhale 2 L into the lungs continuous.     [provider]  potassium chloride SA (K-DUR) 20 MEQ tablet Take 1 tablet (20 mEq total) by mouth daily. Patient taking differently: Take 20 mEq by mouth 2 (two) times daily.  03/22/19 05/28/19  Arrien, Jimmy Picket, MD  venlafaxine XR (EFFEXOR-XR) 150 MG 24 hr capsule Take 1 capsule (150 mg total) by mouth daily with breakfast. Take 150mg  capsule with 37.5mg  tablet 06/01/19   Lattie Haw, MD  VENTOLIN HFA 108 (90 Base) MCG/ACT inhaler INHALE 2 PUFFS INTO THE LUNGS EVERY 6 HOURS AS NEEDED FOR WHEEZING OR SHORTNESS OF BREATH 06/07/19   [name], MD    Allergies    Caffeine, Hydralazine hcl, Hydrocodone, Ciprofloxacin, Erythromycin, Lisinopril, Oxycodone, and Sulfamethoxazole-trimethoprim  Review of Systems   Review of Systems  Constitutional: Negative for chills, diaphoresis and fever.  HENT: Negative for ear pain and sore throat.   Eyes: Negative for pain and  visual disturbance.  Respiratory: Positive for cough, shortness of breath and wheezing.   Cardiovascular: Positive for chest pain (tightness) and leg swelling.  Gastrointestinal: Negative for abdominal pain, constipation, diarrhea, nausea and vomiting.  Genitourinary: Negative for dysuria and hematuria.  Musculoskeletal: Negative for back pain.  Skin: Negative for rash.  Neurological: Negative for headaches.  All other systems reviewed and are negative.   Physical Exam Updated Vital Signs BP  127/82   Pulse (!) 104   Temp (S) (!) 100.4 F (38 C) (Oral)   Resp (!) 25   Ht 5\' 8"  (1.727 m)   Wt (!) 165 kg   SpO2 96%   BMI 55.31 kg/m   Physical Exam Vitals and nursing note reviewed.  Constitutional:      General: She is not in acute distress.    Appearance: She is well-developed.  HENT:     Head: Normocephalic and atraumatic.  Eyes:     Conjunctiva/sclera: Conjunctivae normal.  Cardiovascular:     Rate and Rhythm: Normal rate and regular rhythm.     Heart sounds: No murmur.  Pulmonary:     Effort: Tachypnea present. No respiratory distress.     Breath sounds: Examination of the right-upper field reveals wheezing. Examination of the left-upper field reveals wheezing. Examination of the right-middle field reveals wheezing. Examination of the left-middle field reveals wheezing. Examination of the right-lower field reveals wheezing and rales. Examination of the left-lower field reveals wheezing. Decreased breath sounds, wheezing (end expiratory) and rales present.  Abdominal:     Palpations: Abdomen is soft.     Tenderness: There is no abdominal tenderness. There is no guarding or rebound.  Musculoskeletal:     Cervical back: Neck supple.     Right lower leg: Edema (trace) present.     Left lower leg: Edema (trace) present.  Skin:    General: Skin is warm and dry.  Neurological:     Mental Status: She is alert.    ED Results / Procedures / Treatments   Labs (all labs  ordered are listed, but only abnormal results are displayed) Labs Reviewed  COMPREHENSIVE METABOLIC PANEL - Abnormal; Notable for the following components:      Result Value   Potassium 2.8 (*)    Chloride 97 (*)    Glucose, Bld 135 (*)    Calcium 8.5 (*)    Albumin 3.0 (*)    All other components within normal limits  MAGNESIUM - Abnormal; Notable for the following components:   Magnesium 1.2 (*)    All other components within normal limits  CBC WITH DIFFERENTIAL/PLATELET - Abnormal; Notable for the following components:   WBC 11.4 (*)    Hemoglobin 10.8 (*)    RDW 19.0 (*)    Platelets 466 (*)    Neutro Abs 8.0 (*)    Abs Immature Granulocytes 0.14 (*)    All other components within normal limits  RESPIRATORY PANEL BY RT PCR (FLU A&B, COVID)  CULTURE, BLOOD (ROUTINE X 2)  CULTURE, BLOOD (ROUTINE X 2)  BRAIN NATRIURETIC PEPTIDE  LACTIC ACID, PLASMA  URINALYSIS, ROUTINE W REFLEX MICROSCOPIC  LACTIC ACID, PLASMA  POC SARS CORONAVIRUS 2 AG -  ED  TROPONIN I (HIGH SENSITIVITY)  TROPONIN I (HIGH SENSITIVITY)    EKG EKG Interpretation  Date/Time:  Friday June 14 2019 06:38:39 EST Ventricular Rate:  126 PR Interval:    QRS Duration: 101 QT Interval:  339 QTC Calculation: 487 R Axis:   77 Text Interpretation: Interpretation limited secondary to artifact needs repeat EKG Confirmed by Ripley Fraise [id number]) on 06/14/2019 6:47:39 AM    Date: 06/14/2019  Rate: 115  Rhythm: sinus tach  QRS Axis: normal  Intervals: borderline prolonged QT interval   ST/T Wave abnormalities: normal  Narrative Interpretation: sinus tach, PVC, borderline repol abnormality, borderline prolonged qt   Radiology DG Chest Portable 1 View  Result Date: 06/14/2019 CLINICAL DATA:  Onset shortness  of breath yesterday. EXAM: PORTABLE CHEST 1 VIEW COMPARISON:  Single-view of the chest 05/28/2019. CT chest 03/14/2019. FINDINGS: Lung volumes are low and there is elevation of the right  hemidiaphragm. No consolidative process, pneumothorax or effusion is identified. Heart size is mildly enlarged. Aortic atherosclerosis is noted. IMPRESSION: No acute finding in a low volume chest. Atherosclerosis. Mild cardiomegaly. Electronically Signed   By: Inge Rise M.D.   On: 06/14/2019 07:39    Procedures Procedures (including critical care time)  Medications Ordered in ED Medications  ipratropium (ATROVENT HFA) inhaler 2 puff (2 puffs Inhalation Not Given 06/14/19 0756)  vancomycin (VANCOREADY) IVPB 2000 mg/400 mL (has no administration in time range)  vancomycin (VANCOREADY) IVPB 1250 mg/250 mL (has no administration in time range)  potassium chloride SA (KLOR-CON) CR tablet 40 mEq (has no administration in time range)  albuterol (PROVENTIL) (2.5 MG/3ML) 0.083% nebulizer solution 5 mg (5 mg Nebulization Given 06/14/19 0755)  methylPREDNISolone sodium succinate (SOLU-MEDROL) 125 mg/2 mL injection 125 mg (125 mg Intravenous Given 06/14/19 0833)  magnesium sulfate IVPB 2 g 50 mL (0 g Intravenous Stopped 06/14/19 0941)  acetaminophen (TYLENOL) tablet 650 mg (650 mg Oral Given 06/14/19 0839)  ceFEPIme (MAXIPIME) 2 g in sodium chloride 0.9 % 100 mL IVPB (2 g Intravenous New Bag/Given 06/14/19 0943)  ipratropium-albuterol (DUONEB) 0.5-2.5 (3) MG/3ML nebulizer solution 3 mL (3 mLs Nebulization Given 06/14/19 1019)    ED Course  I have reviewed the triage vital signs and the nursing notes.  Pertinent labs & imaging results that were available during my care of the patient were reviewed by me and considered in my medical decision making (see chart for details).    MDM Rules/Calculators/A&P                     63 y/o female with a h/o CHF, COPD who presents to the ED today for eval of sob, cough, and chest tightness. She notes cough started about 3 days ago, sob yesterday and ble swelling today.   Pt afebrile. Tachycardic at 128, tachypneic in the 20s, with mildly increased BP.  Trialed with baseline 3L O2 requirement and sats dropped to 88%. Placed on 15L NRB and sats improved to 100% but pt remained tachypneic in the 20s.   CBC with mild leukocytosis and mild but stable anemia, thrombocytosis present which is at baseline CMP with K 2.8, otherwise reassuring  - Po K given.  Mg mildly low  - IV mag given as repletion and for COPD management Trop negative BNP negative Blood cultures obtained Lactic acid neg  - doubt sepsis POC COVID neg Resp panel neg for flu or COVID UA pending on admission   EKG nonischemic  CXR with no acute finding in a low volume chest. Atherosclerosis. Mild cardiomegaly.   CXR neg, POC COVID. Added abx to cover hcap given recent admission however presentation seems more likely related to COPD exacerbation. Pt is still tachypneic and feeling dyspneic after multiple interventions. Will admit.   10:41 AM CONSULT With Dr. Shan Levans with family medicine residency service who accepts patient for admission.   Final Clinical Impression(s) / ED Diagnoses Final diagnoses:  Hypoxia    Rx / DC Orders ED Discharge Orders    None       Rodney Booze, PA-C 06/14/19 1041    Sherwood Gambler, MD 06/14/19 1537

## 2019-06-14 NOTE — ED Notes (Signed)
Pt on 3L nasal cannula 02 sat. 86%

## 2019-06-14 NOTE — Progress Notes (Addendum)
Pharmacy Antibiotic Note  [name] White is a 63 y.o. female admitted on 06/14/2019 with pneumonia.  Pharmacy has been consulted for vancomycin dosing.  Presenting with SOB - getting progressive worse, audible wheezing. CXR showing no acute finding, mild cardiomegaly. Temp 100.4, COVID neg. WBC 11.4, Scr 0.71 (normCrCl >100 mL/min).    Plan: Vancomycin 2g IV once then 1250 mg IV every 12 hours (estAUC 474)  Cefepime 2 g IV every 8 hours Monitor renal fx, clinical pic, cx results, and vanc levels   Height: 5\' 8"  (172.7 cm) Weight: (!) 363 lb 12.1 oz (165 kg) IBW/kg (Calculated) : 63.9  Temp (24hrs), Avg:98.8 F (37.1 C), Min:97.1 F (36.2 C), Max:100.4 F (38 C)  No results for input(s): WBC, CREATININE, LATICACIDVEN, VANCOTROUGH, VANCOPEAK, VANCORANDOM, GENTTROUGH, GENTPEAK, GENTRANDOM, TOBRATROUGH, TOBRAPEAK, TOBRARND, AMIKACINPEAK, AMIKACINTROU, AMIKACIN in the last 168 hours.  Estimated Creatinine Clearance: 117 mL/min (by C-G formula based on SCr of 0.48 mg/dL).    Allergies  Allergen Reactions  . Caffeine Other (See Comments)    Migraine  . Hydralazine Hcl     Other reaction(s): Other (See Comments) AKI leading to rhabdo and electrolyte abnormalities  . Hydrocodone Nausea And Vomiting    Headache  . Ciprofloxacin Hives and Rash  . Erythromycin Hives and Rash  . Lisinopril Cough  . Oxycodone Nausea And Vomiting    Headache  . Sulfamethoxazole-Trimethoprim Rash    Antimicrobials this admission: Vancomycin 12/18 >>  Cefepime 12/18>>  Dose adjustments this admission: N/A  Microbiology results: 12/18 BCx: sent 12/18 Resp PCR: sent   Thank you for allowing pharmacy to be a part of this patient's care.  Antonietta Jewel, PharmD, BCCCP Clinical Pharmacist  Phone: [phone]  Please check AMION for all [place] phone numbers After 10:00 PM, call [place] [phone] 06/14/2019 8:09 AM

## 2019-06-15 ENCOUNTER — Other Ambulatory Visit: Payer: Self-pay

## 2019-06-15 LAB — BASIC METABOLIC PANEL
Anion gap: 9 (ref 5–15)
BUN: 12 mg/dL (ref 8–23)
CO2: 33 mmol/L — ABNORMAL HIGH (ref 22–32)
Calcium: 8.3 mg/dL — ABNORMAL LOW (ref 8.9–10.3)
Chloride: 101 mmol/L (ref 98–111)
Creatinine, Ser: 0.67 mg/dL (ref 0.44–1.00)
GFR calc Af Amer: >60 mL/min (ref >60)
GFR calc non Af Amer: >60 mL/min (ref >60)
Glucose, Bld: 204 mg/dL — ABNORMAL HIGH (ref 70–99)
Potassium: 4 mmol/L (ref 3.5–5.1)
Sodium: 143 mmol/L (ref 135–145)

## 2019-06-15 LAB — CBC
HCT: 36.3 % (ref 36.0–46.0)
Hemoglobin: 10.9 g/dL — ABNORMAL LOW (ref 12.0–15.0)
MCH: 27.2 pg (ref 26.0–34.0)
MCHC: 30 g/dL (ref 30.0–36.0)
MCV: 90.5 fL (ref 80.0–100.0)
Platelets: 489 10*3/uL — ABNORMAL HIGH (ref 150–400)
RBC: 4.01 MIL/uL (ref 3.87–5.11)
RDW: 18.9 % — ABNORMAL HIGH (ref 11.5–15.5)
WBC: 13.7 10*3/uL — ABNORMAL HIGH (ref 4.0–10.5)
nRBC: 0.2 % (ref 0.0–0.2)

## 2019-06-15 LAB — MAGNESIUM: Magnesium: 2.3 mg/dL (ref 1.7–2.4)

## 2019-06-15 LAB — HEMOGLOBIN A1C
Hgb A1c MFr Bld: 7.8 % — ABNORMAL HIGH (ref 4.8–5.6)
Mean Plasma Glucose: 177.16 mg/dL

## 2019-06-15 LAB — GLUCOSE, CAPILLARY
Glucose-Capillary: 162 mg/dL — ABNORMAL HIGH (ref 70–99)
Glucose-Capillary: 203 mg/dL — ABNORMAL HIGH (ref 70–99)
Glucose-Capillary: 184 mg/dL — ABNORMAL HIGH (ref 70–99)

## 2019-06-15 MED ORDER — INSULIN ASPART 100 UNIT/ML ~~LOC~~ SOLN: 0.0000 [IU] | Freq: Every day | SUBCUTANEOUS | Status: DC

## 2019-06-15 MED ORDER — FUROSEMIDE 80 MG PO TABS
80.0000 mg | ORAL_TABLET | Freq: Every day | ORAL | Status: DC
  Administered 2019-06-15: 80 mg via ORAL
  Filled 2019-06-15: qty 1

## 2019-06-15 MED ORDER — KETOTIFEN FUMARATE 0.025 % OP SOLN
1.0000 [drp] | Freq: Two times a day (BID) | OPHTHALMIC | Status: DC
  Administered 2019-06-15 – 2019-06-16 (×3): 1 [drp] via OPHTHALMIC
  Filled 2019-06-15: qty 5

## 2019-06-15 MED ORDER — IPRATROPIUM-ALBUTEROL 0.5-2.5 (3) MG/3ML IN SOLN
3.0000 mL | Freq: Four times a day (QID) | RESPIRATORY_TRACT | Status: DC
  Administered 2019-06-15 – 2019-06-16 (×4): 3 mL via RESPIRATORY_TRACT
  Filled 2019-06-15 (×4): qty 3

## 2019-06-15 MED ORDER — DOXYCYCLINE HYCLATE 100 MG PO TABS
100.0000 mg | ORAL_TABLET | Freq: Two times a day (BID) | ORAL | Status: DC
  Administered 2019-06-15 – 2019-06-16 (×3): 100 mg via ORAL
  Filled 2019-06-15 (×3): qty 1

## 2019-06-15 MED ORDER — INSULIN ASPART 100 UNIT/ML ~~LOC~~ SOLN
0.0000 [IU] | Freq: Three times a day (TID) | SUBCUTANEOUS | Status: DC
  Administered 2019-06-15: 2 [IU] via SUBCUTANEOUS
  Administered 2019-06-15: 3 [IU] via SUBCUTANEOUS
  Administered 2019-06-16: 1 [IU] via SUBCUTANEOUS

## 2019-06-15 MED ORDER — PREDNISONE 20 MG PO TABS
40.0000 mg | ORAL_TABLET | Freq: Every day | ORAL | Status: DC
  Administered 2019-06-16: 40 mg via ORAL
  Filled 2019-06-15: qty 2

## 2019-06-15 MED ORDER — MOMETASONE FURO-FORMOTEROL FUM 100-5 MCG/ACT IN AERO
2.0000 | INHALATION_SPRAY | Freq: Two times a day (BID) | RESPIRATORY_TRACT | Status: DC
  Administered 2019-06-15 – 2019-06-16 (×3): 2 via RESPIRATORY_TRACT
  Filled 2019-06-15: qty 8.8

## 2019-06-15 MED ORDER — AZELASTINE HCL 0.1 % NA SOLN
1.0000 | Freq: Two times a day (BID) | NASAL | Status: DC
  Administered 2019-06-15 – 2019-06-16 (×3): 1 via NASAL
  Filled 2019-06-15: qty 30

## 2019-06-15 MED ORDER — ALBUTEROL SULFATE (2.5 MG/3ML) 0.083% IN NEBU: 2.5000 mg | INHALATION_SOLUTION | RESPIRATORY_TRACT | Status: DC | PRN

## 2019-06-15 NOTE — Progress Notes (Addendum)
Family Medicine Teaching Service Daily Progress Note Intern Pager: [phone]  Patient name: [name] White Medical record number: [id number] Date of birth: [birth year] Age: 63 y.o. Gender: female  Primary Care Provider: [name], MD Consultants: None Code Status: DNR  Pt Overview and Major Events to Date:  12/8 Admitted to FPTS  Assessment and Plan: [name] White is a 63 y.o. female presenting with shortness of breath.PMH is significant for HFpEF,COPD/asthma,obesity, OHS not on CPAP, PAF on Eliquis, HTN, MDD, vocal cord dysfunction, H/o VTE in 2015.  Acute on Chronic Respiratory Failure Exacerbation of Chronic Lung Disease COVID neg x2.  Patient with underlying vocal cord dysfunction, which can also contribute to her symptoms.  Home O2 requirement of 3-4 L.  Increased from 4L to 5L overnight with no reported desaturation noted in the chart.  Requested CPAP, then only tolerated for a short while 2/2 nose burning sensation and anxiety.  She has remained afebrile, since reported temp of 100.4 in ED at 0700 on 12/18.  VSS this AM, HR in 90s, even with duoneb administration.  WBC mildly increased from 11.4>13.7 likely with steroid administration.  Received IV Lasix 80mg  x1 and Lasix 40mg  PO x1 yesterday, with UOP 2.4 L.  S/P vanc and cefepime on 12/18, this AM lungs sound clear, only upper airway wheezing noted likely 22/ vocal cord dysfunction.  Will transition to PO doxycyline today for COPD exacerbationantibiotic coverage. -S/p vancomycin and cefepime (12/18) - start doxycyline 100mg  BID (12/19- ) -S/p Solu-Medrol, cont prednisone 40 mg x 5 days (12/19- ) -start Lasix 80 PO QD -Strict I's and O's -Daily weights (patient reports dry weight is about 350 lbs) -Decrease scheduled DuoNebs every 6 hours, albuterol every 4 hours as needed -CPAP nightly -PT -Continuous pulse ox   -Consider cardiac MRI to look for causes of severe LVH  Hypomagnesemia and hypokalemia:  Resolved K2.8>3.2>4.0, Mag 1.6>2.3.  Goal of K greater than 4 and magnesium greater than 2 given flecainide use. QTc 462 this AM. - monitor AM BMP, Mag  HTN Remains normotensive. Home meds diltiazem, metoprolol.   -Continue home meds  PAF onEliquis Prior VTE Sinus rhythm on EKG this AM.  On diltiazem, metoprolol, flecainide at home.  History of PE in 2015, compliant with Eliquis. -Continue home Eliquis, diltiazem, metoprolol, flecainide  MDD Home meds, Effexor 150 mg daily, hydroxyzine as needed. -Continue home meds  Prior GI bleed Hospitalized 03/05/19-03/07/2019 likely secondary to diverticular bleed as well as abdominal hematoma sustained s/p fall. Hgb 10.9 today and stable. -Monitor hemoglobin  Prediabetes   Last A1c 6.3 03/19/2019, 7.8 on admission.  Currently not on any chronic antidiabetic medication.  Patient frequently receives doses of steroids for her respiratory complaints, therefore this is likely cause of her elevated sugars.  CBGs overnight elevated with highest of 307. -sSSI with HS coverage while inpatient on steroids  Vocal cord dysfunction Has previously worked with [employer] improvement  Prior homelessness Noted per chart review. Currently living with her niece.  FEN/GI: Heart healthy PPx: Home Eliquis  Disposition: pending clinical improvement, lives with niece   Subjective:  Patient reports that she feels improved this morning from admission, but still states that she does not feel at her baseline.  Reports that she thinks her breathing has improved with her current treatment.    Objective: Temp:  [97.7 F (36.5 C)-98.1 F (36.7 C)] 98.1 F (36.7 C) (12/19 0747) Pulse Rate:  [76-117] 92 (12/19 0747) Resp:  [18-34] 26 (12/19 0747) BP: (109-137)/(63-87) 109/65 (12/19  0747) SpO2:  [92 %-97 %] 95 % (12/19 0747) FiO2 (%):  [95 %] 95 % (12/18 1019) Weight:  [161.7 kg-165 kg] 161.7 kg (12/19 0445)  Physical Exam:  General: 63 y.o. female in  NAD Cardio: RRR no m/r/g Lungs: Lungs clear, upper airway wheezing noted on exam, does not radiate to the lungs.  On 5 L per [place], no increased work of breathing. Skin: warm and dry Extremities: Trace bilateral lower extremity edema   Laboratory: Recent Labs  Lab 06/14/19 0652 06/15/19 0345  WBC 11.4* 13.7*  HGB 10.8* 10.9*  HCT 36.0 36.3  PLT 466* 489*   Recent Labs  Lab 06/14/19 0652 06/14/19 1717 06/15/19 0345  NA 139 139 143  K 2.8* 3.2* 4.0  CL 97* 97* 101  CO2 27 30 33*  BUN 9 8 12   CREATININE 0.71 0.96 0.67  CALCIUM 8.5* 8.2* 8.3*  PROT 7.6  --   --   BILITOT 0.3  --   --   ALKPHOS 81  --   --   ALT 23  --   --   AST 24  --   --   GLUCOSE 135* 307* 204*    Imaging/Diagnostic Tests: No results found.  [place], DO 06/15/2019, 7:53 AM PGY-2, [place] Intern pager: [phone], text pages welcome

## 2019-06-15 NOTE — Evaluation (Signed)
Physical Therapy Evaluation Patient Details Name: [name] White MRN: [id number] DOB: 1956/06/22 Today's Date: 06/15/2019   History of Present Illness  63 y.o. female presenting with shortness of breath. PMH is significant for HFpEF, COPD/asthma, obesity, OHS not on CPAP, PAF on Eliquis, HTN, MDD, vocal cord dysfunction, H/o VTE in 2015. Pt with COPD and CHF exacerbation.  Clinical Impression  Pt presents to PT with deficits in gait, balance, and cardiopulmonary function. Pt with increased oxygen needs compared to baseline, and continuing to desaturate with limited functional mobility and ambulation. Pt will benefit from continued acute PT services to increase activity tolerance, educate the pt on energy conservation techniques, and aide in restoring the pt's prior level of function. PT recommends discharge home with HHPT, no current DME needs.    Follow Up Recommendations Home health PT;Supervision - Intermittent    Equipment Recommendations  None recommended by PT(pt owns necessary DME)    Recommendations for Other Services       Precautions / Restrictions Precautions Precautions: Fall Precaution Comments: watch O2 sat Restrictions Weight Bearing Restrictions: No      Mobility  Bed Mobility Overal bed mobility: Needs Assistance Bed Mobility: Supine to Sit     Supine to sit: Supervision;HOB elevated        Transfers Overall transfer level: Needs assistance Equipment used: Rolling walker (2 wheeled) Transfers: Sit to/from Stand Sit to Stand: Supervision            Ambulation/Gait Ambulation/Gait assistance: Supervision Gait Distance (Feet): 10 Feet Assistive device: Rolling walker (2 wheeled) Gait Pattern/deviations: Step-to pattern;Wide base of support Gait velocity: reduced Gait velocity interpretation: <1.8 ft/sec, indicate of risk for recurrent falls General Gait Details: pt with short step to gait with reduced step length and gait speed  Stairs             Wheelchair Mobility    Modified Rankin (Stroke Patients Only)       Balance Overall balance assessment: Needs assistance Sitting-balance support: No upper extremity supported;Feet unsupported Sitting balance-Leahy Scale: Good     Standing balance support: Bilateral upper extremity supported Standing balance-Leahy Scale: Good Standing balance comment: supervision with BUE support of RW                             Pertinent Vitals/Pain Pain Assessment: No/denies pain    Home Living Family/patient expects to be discharged to:: Private residence Living Arrangements: Other relatives Available Help at Discharge: Family;Available PRN/intermittently;Other (Comment)(HH aide 2 days/wk) Type of Home: Apartment Home Access: Stairs to enter Entrance Stairs-Rails: None Entrance Stairs-Number of Steps: 1 Home Layout: One level Home Equipment: Walker - 2 wheels;Cane - single point;Walker - 4 wheels Additional Comments: lives with niece    Prior Function Level of Independence: Independent with assistive device(s)         Comments: ambulates short household distances with RW, requires setup/supervision with ADLs     Hand Dominance        Extremity/Trunk Assessment   Upper Extremity Assessment Upper Extremity Assessment: Overall WFL for tasks assessed    Lower Extremity Assessment Lower Extremity Assessment: Overall WFL for tasks assessed    Cervical / Trunk Assessment Cervical / Trunk Assessment: (body habitus)  Communication   Communication: No difficulties  Cognition Arousal/Alertness: Awake/alert Behavior During Therapy: WFL for tasks assessed/performed Overall Cognitive Status: Within Functional Limits for tasks assessed  General Comments General comments (skin integrity, edema, etc.): pt on 5L [place]. Pt desaturates to 90% with initial stand step turn transfer and to 88% during 8'  ambulation. Pt recovers quickly, within 30 seconds of sitting and pursed lip breathing back to at least 92%    Exercises     Assessment/Plan    PT Assessment Patient needs continued PT services  PT Problem List Decreased activity tolerance;Decreased balance;Decreased mobility;Cardiopulmonary status limiting activity       PT Treatment Interventions DME instruction;Gait training;Stair training;Therapeutic activities;Functional mobility training;Therapeutic exercise;Balance training;Neuromuscular re-education;Patient/family education    PT Goals (Current goals can be found in the Care Plan section)  Acute Rehab PT Goals Patient Stated Goal: To return to baseline and mobilize without SOB PT Goal Formulation: With patient Time For Goal Achievement: 06/29/19 Potential to Achieve Goals: Good    Frequency Min 3X/week   Barriers to discharge        Co-evaluation               AM-PAC PT "6 Clicks" Mobility  Outcome Measure Help needed turning from your back to your side while in a flat bed without using bedrails?: None Help needed moving from lying on your back to sitting on the side of a flat bed without using bedrails?: None Help needed moving to and from a bed to a chair (including a wheelchair)?: None Help needed standing up from a chair using your arms (e.g., wheelchair or bedside chair)?: None Help needed to walk in hospital room?: A Little Help needed climbing 3-5 steps with a railing? : A Little 6 Click Score: 22    End of Session Equipment Utilized During Treatment: Oxygen Activity Tolerance: Patient limited by fatigue Patient left: in chair;with call bell/phone within reach Nurse Communication: Mobility status PT Visit Diagnosis: Difficulty in walking, not elsewhere classified (R26.2);Other (comment)    [id number] PT Time Calculation (min) (ACUTE ONLY): 27 min   Charges:   PT Evaluation $PT Eval Moderate Complexity: 1 Mod PT Treatments $Gait Training:  8-22 mins        Zenaida Niece, PT, DPT Acute Rehabilitation Pager: [phone]   Zenaida Niece 06/15/2019, 2:03 PM

## 2019-06-15 NOTE — Progress Notes (Signed)
Pt placed on CPAP tonight. Tolerated well for a short while, then removed mask complaining of burning sensation in nose and anxiety. Refused to continue CPAP and requested PRN hydroxyzine. RT notified. Medication administered. Pt on nasal cannula 4L. Monitoring.

## 2019-06-15 NOTE — Discharge Summary (Addendum)
[facility] Discharge Summary  Patient name: [name] White Medical record number: [id number] Date of birth: May 07, 1956 Age: 63 y.o. Gender: female Date of Admission: 06/14/2019  Date of Discharge: 06/16/2019 Admitting Physician: Kathrene Alu, MD  Primary Care Provider: [name], MD Consultants: None  Indication for Hospitalization: Acute on chronic respiratory failure likely due to possible obstructive lung disease exacerbation  Discharge Diagnoses/Problem List:  COPD versus other obstructive lung disease HFpEF Hypertension PAF on Eliquis, prior VTE MDD Prior GI bleed Prediabetes  Disposition: Home with [facility] PT  Discharge Condition: Stable, improved  Discharge Exam:  General: 63 y.o. female, obese, comfortable appearing, lying in bed Cardio: RRR, no MRG Lungs: CTA B with upper airway sounds, on 3 L O2, mildly tachypneic Skin: Warm and dry Extremities: 1+ bilateral lower extremity edema to knees  Brief Hospital Course:  [name] is a 63 y.o. female who presented with shortness of breath.PMH is significant for HFpEF,COPD/asthma,obesity, OHS not on CPAP, PAF on Eliquis, HTN, MDD, vocal cord dysfunction, H/o VTE in 2015.  Her hospital course is outlined below.  Acute on Chronic Respiratory Failure Patient febrile to 100.4 on admission and with mildly elevated white count to 11.4. She does not have documented PFTs with diagnosis consistent with COPD, but has been treated as COPD in the past. Patient also has a known history of vocal cord dysfunction, and frequently has upper airway wheezing that occasionally contributes to being treated for lung disease. She also has HFpEF, and was mildly fluid overloaded on exam, chest x-ray without obvious infiltrates or edema. Of note, BNP was 43. She had an oxygen requirement of 5 L at beginning of hospitalization, but improved with IV Lasix 80x1, 1 dose of Solu-Medrol, vancomycin, cefepime. The  day after admission, she was greatly improved and able to sat comfortably on home 3 L O2 requirement. Lungs were clear to auscultation. Her antibiotics were deescalated to treatment for suspected COPD exacerbation with doxycycline, and continued on prednisone 40 mg for 5 days. P.o. diuresis of Lasix 80 mg daily was started, given patient noted that she was actually on this dose prior to leaving SNF, not 40 mg daily. She also used scheduled duo nebs while inpatient. Her respiratory status continued to improve and at the time of discharge she was satting comfortably on her home level of 3 liters. Specific cause of her acute on chronic respiratory failure was difficult to decide, but suspect some of it is due to vocal cord dysfunction playing a role in her perceived wheezing. At the time of discharge, vital signs were stable, patient was without complaint.  Prediabetes Patient's A1c in September was 6.3, was 7.8 on admission. Patient has had multiple doses of steroids for recent hospitalizations, most recently during this admission. CBGs were mildly elevated and she was treated with sliding scale insulin. Suspect that her A1c was elevated fully in the setting of corticosteroid use. Recommend follow-up as outpatient, repeat A1c in 3 months, consider therapy if needed.  Issues for Follow Up:  1. Repeat A1c in 3 months, consider diabetic therapy as needed. It is expected that her A1c is elevated secondary to frequent steroid use. 2. Patient was referred to cardiology on discharge, ensure follow-up. 3. Concerned that patient's vocal cord dysfunction contributes to perceived wheezing. Ensure follow-up with pulmonology. 4. Measure a BMP and magnesium level to ensure that they are within normal limits with increased dose of Lasix and importance of normal QT intervals on flecainide.  She  is currently on K-Dur 40 mEq twice daily, and this may need to be increased due to hypokalemia on presentation and increasing of the  furosemide dose.  Significant Procedures: None  Significant Labs and Imaging:  Recent Labs  Lab 06/14/19 0652 06/15/19 0345 06/16/19 0451  WBC 11.4* 13.7* 14.5*  HGB 10.8* 10.9* 9.9*  HCT 36.0 36.3 33.2*  PLT 466* 489* 486*   Recent Labs  Lab 06/14/19 0652 06/14/19 1717 06/15/19 0345 06/16/19 0451  NA 139 139 143 141  K 2.8* 3.2* 4.0 3.4*  CL 97* 97* 101 99  CO2 27 30 33* 32  GLUCOSE 135* 307* 204* 173*  BUN 9 8 12 14   CREATININE 0.71 0.96 0.67 0.67  CALCIUM 8.5* 8.2* 8.3* 8.2*  MG 1.2* 1.6* 2.3 2.0  ALKPHOS 81  --   --   --   AST 24  --   --   --   ALT 23  --   --   --   ALBUMIN 3.0*  --   --   --      Results/Tests Pending at Time of Discharge: none  Discharge Medications:  Allergies as of 06/16/2019      Reactions   Caffeine Other (See Comments)   Migraine   Hydralazine Hcl    Other reaction(s): Other (See Comments) AKI leading to rhabdo and electrolyte abnormalities   Hydrocodone Nausea And Vomiting   Headache   Ciprofloxacin Hives, Rash   Erythromycin Hives, Rash   Lisinopril Cough   Oxycodone Nausea And Vomiting   Headache   Sulfamethoxazole-trimethoprim Rash      Medication List    TAKE these medications   acetaminophen 325 MG tablet Commonly known as: TYLENOL Take 2 tablets (650 mg total) by mouth every 6 (six) hours as needed for mild pain.   albuterol (2.5 MG/3ML) 0.083% nebulizer solution Commonly known as: PROVENTIL Take 3 mLs (2.5 mg total) by nebulization every 6 (six) hours as needed for wheezing or shortness of breath. What changed: Another medication with the same name was changed. Make sure you understand how and when to take each.   Ventolin HFA 108 (90 Base) MCG/ACT inhaler Generic drug: albuterol INHALE 2 PUFFS INTO THE LUNGS EVERY 6 HOURS AS NEEDED FOR WHEEZING OR SHORTNESS OF BREATH What changed: See the new instructions.   apixaban 5 MG Tabs tablet Commonly known as: ELIQUIS Take 1 tablet (5 mg total) by mouth 2 (two)  times daily.   azelastine 0.05 % ophthalmic solution Commonly known as: OPTIVAR Place 1 drop into both eyes 2 (two) times daily as needed (itchy eyes).   azelastine 0.1 % nasal spray Commonly known as: ASTELIN Place 2 sprays into both nostrils 2 (two) times daily. Use in each nostril as directed What changed:   when to take this  additional instructions   cyclobenzaprine 10 MG tablet Commonly known as: FLEXERIL Take 10 mg by mouth every 8 (eight) hours as needed for muscle spasms.   cycloSPORINE 0.05 % ophthalmic emulsion Commonly known as: RESTASIS Place 1 drop into both eyes 2 (two) times daily.   diclofenac sodium 1 % Gel Commonly known as: VOLTAREN APPLY 2 GRAMS EXTERNALLY TO THE AFFECTED AREA FOUR TIMES DAILY What changed: See the new instructions.   diltiazem 120 MG 24 hr capsule Commonly known as: CARDIZEM CD Take 1 capsule (120 mg total) by mouth daily. What changed: when to take this   doxycycline 100 MG tablet Commonly known as: VIBRA-TABS Take 1 tablet (  100 mg total) by mouth 2 (two) times daily for 4 days.   famotidine 20 MG tablet Commonly known as: PEPCID Take 1 tablet (20 mg total) by mouth daily.   flecainide 100 MG tablet Commonly known as: TAMBOCOR TAKE 1 TABLET BY MOUTH 2 TIMES A DAY. PLEASE KEEP UPCOMING APPOINTMENT IN JANUARY BEFORE ANYMORE REFILLS. What changed: See the new instructions.   fluticasone 50 MCG/ACT nasal spray Commonly known as: FLONASE Place 2 sprays into both nostrils daily.   furosemide 80 MG tablet Commonly known as: LASIX Take 1 tablet (80 mg total) by mouth daily. What changed:   medication strength  how much to take   guaiFENesin-dextromethorphan 100-10 MG/5ML syrup Commonly known as: ROBITUSSIN DM Take 5 mLs by mouth every 4 (four) hours as needed for cough.   hydrocortisone 25 MG suppository Commonly known as: ANUSOL-HC Place 1 suppository (25 mg total) rectally 2 (two) times daily.   hydrOXYzine 25 MG  capsule Commonly known as: VISTARIL Take 1 capsule (25 mg total) by mouth 2 (two) times daily as needed for anxiety.   ipratropium-albuterol 0.5-2.5 (3) MG/3ML Soln Commonly known as: DUONEB Take 3 mLs by nebulization 3 (three) times daily.   loratadine 10 MG tablet Commonly known as: CLARITIN TAKE 1 TABLET(10 MG) BY MOUTH DAILY What changed: See the new instructions.   metoprolol tartrate 25 MG tablet Commonly known as: LOPRESSOR Take 0.5 tablets (12.5 mg total) by mouth 2 (two) times daily.   mometasone-formoterol 100-5 MCG/ACT Aero Commonly known as: DULERA Inhale 2 puffs into the lungs 2 (two) times daily.   montelukast 10 MG tablet Commonly known as: SINGULAIR Take 1 tablet (10 mg total) by mouth at bedtime.   omeprazole 20 MG capsule Commonly known as: PRILOSEC Take 1 capsule (20 mg total) by mouth daily.   OXYGEN Inhale 3 L into the lungs continuous.   potassium chloride SA 20 MEQ tablet Commonly known as: KLOR-CON Take 2 tablets (40 mEq total) by mouth 2 (two) times daily.   predniSONE 20 MG tablet Commonly known as: DELTASONE Take 2 tablets (40 mg total) by mouth daily with breakfast for 2 days.   venlafaxine 37.5 MG tablet Commonly known as: EFFEXOR Take 37.5 mg by mouth daily with breakfast. Take with a 150 mg ER capsule every morning What changed: Another medication with the same name was changed. Make sure you understand how and when to take each.   venlafaxine XR 150 MG 24 hr capsule Commonly known as: EFFEXOR-XR Take 1 capsule (150 mg total) by mouth daily with breakfast. Take 150mg  capsule with 37.5mg  tablet What changed: additional instructions       Discharge Instructions: Please refer to Patient Instructions section of EMR for full details.  Patient was counseled important signs and symptoms that should prompt return to medical care, changes in medications, dietary instructions, activity restrictions, and follow up appointments.   Follow-Up  Appointments: Follow-up Information    [name], MD. Go on 06/19/2019.   Specialty: Family Medicine Why: Please go to your appointment at 2:55PM. Contact information: [id number] [place] [address] [phone]           Kathrene Alu, MD 06/16/2019, 7:21 AM PGY-3, [place]

## 2019-06-16 LAB — CBC
HCT: 33.2 % — ABNORMAL LOW (ref 36.0–46.0)
Hemoglobin: 9.9 g/dL — ABNORMAL LOW (ref 12.0–15.0)
MCH: 27 pg (ref 26.0–34.0)
MCHC: 29.8 g/dL — ABNORMAL LOW (ref 30.0–36.0)
MCV: 90.7 fL (ref 80.0–100.0)
Platelets: 486 10*3/uL — ABNORMAL HIGH (ref 150–400)
RBC: 3.66 MIL/uL — ABNORMAL LOW (ref 3.87–5.11)
RDW: 19.4 % — ABNORMAL HIGH (ref 11.5–15.5)
WBC: 14.5 10*3/uL — ABNORMAL HIGH (ref 4.0–10.5)
nRBC: 0.2 % (ref 0.0–0.2)

## 2019-06-16 LAB — BASIC METABOLIC PANEL
Anion gap: 10 (ref 5–15)
BUN: 14 mg/dL (ref 8–23)
CO2: 32 mmol/L (ref 22–32)
Calcium: 8.2 mg/dL — ABNORMAL LOW (ref 8.9–10.3)
Chloride: 99 mmol/L (ref 98–111)
Creatinine, Ser: 0.67 mg/dL (ref 0.44–1.00)
GFR calc Af Amer: >60 mL/min (ref >60)
GFR calc non Af Amer: >60 mL/min (ref >60)
Glucose, Bld: 173 mg/dL — ABNORMAL HIGH (ref 70–99)
Potassium: 3.4 mmol/L — ABNORMAL LOW (ref 3.5–5.1)
Sodium: 141 mmol/L (ref 135–145)

## 2019-06-16 LAB — MAGNESIUM: Magnesium: 2 mg/dL (ref 1.7–2.4)

## 2019-06-16 LAB — GLUCOSE, CAPILLARY: Glucose-Capillary: 136 mg/dL — ABNORMAL HIGH (ref 70–99)

## 2019-06-16 MED ORDER — DOXYCYCLINE HYCLATE 100 MG PO TABS: 100.0000 mg | ORAL_TABLET | Freq: Two times a day (BID) | ORAL | 0 refills | Status: AC

## 2019-06-16 MED ORDER — POTASSIUM CHLORIDE CRYS ER 20 MEQ PO TBCR
40.0000 meq | EXTENDED_RELEASE_TABLET | Freq: Once | ORAL | Status: AC
  Administered 2019-06-16: 40 meq via ORAL
  Filled 2019-06-16: qty 2

## 2019-06-16 MED ORDER — PREDNISONE 20 MG PO TABS: 40.0000 mg | ORAL_TABLET | Freq: Every day | ORAL | 0 refills | Status: AC

## 2019-06-16 MED ORDER — FUROSEMIDE 80 MG PO TABS: 80.0000 mg | ORAL_TABLET | Freq: Every day | ORAL | 0 refills | Status: DC

## 2019-06-16 MED ORDER — POTASSIUM CHLORIDE CRYS ER 20 MEQ PO TBCR: 40.0000 meq | EXTENDED_RELEASE_TABLET | Freq: Every day | ORAL | 0 refills | Status: DC

## 2019-06-16 MED ORDER — GUAIFENESIN-DM 100-10 MG/5ML PO SYRP
5.0000 mL | ORAL_SOLUTION | ORAL | Status: DC | PRN
  Administered 2019-06-16: 5 mL via ORAL
  Filled 2019-06-16: qty 5

## 2019-06-16 MED ORDER — POTASSIUM CHLORIDE CRYS ER 20 MEQ PO TBCR: 40.0000 meq | EXTENDED_RELEASE_TABLET | Freq: Two times a day (BID) | ORAL | 0 refills | Status: DC

## 2019-06-16 NOTE — Procedures (Signed)
Patient declined to use CPAP tonight.  Patient does not use CPAP at home.

## 2019-06-16 NOTE — Care Management (Addendum)
Notified [place] that patient will DC oday. Requested [employer] to deliver portable tank by 0930. Patient states she may need RW if niece cannot bring her's from home. This can also be ordered from [employer] for delivery to room to facilitate DC.  Patient attempting to reach niece for ride home. PTAR can be arranged as alternative, this would provide oxygen for transport and assistance to get into house. Spoke w bedside nurse, and notified resident that patient will need early DC as her niece works and has an early afternoon shift; patient will need to be at home by 11:00 for niece to let her in the house.   I can be reached through secure chat or Amion for further assistance.   PTAR set up for DC to [address]

## 2019-06-16 NOTE — Discharge Instructions (Signed)
Please call your cardiologist, Dr. Lovena Le, at [phone] to follow-up soon at his office.  Please follow-up with your PCP, Dr. Kris Mouton, at 2:55 PM on Wednesday, December 23.  You were admitted for a possible COPD exacerbation with possible exacerbation of your heart failure.  We are giving you an antibiotic called doxycycline, which you should take once in the morning and once in the evening through 12/24.  We are also giving you prednisone to help reduce inflammation in your lungs, which you should take two pills once per day through 12/22.  We are also increasing your Lasix dose to 80 mg daily and giving you potassium supplementation to 80 mEq/day, which will be two pills twice per day.  Please follow-up with your pulmonologist office soon as well.  We hope you feel better and have a wonderful holiday.

## 2019-06-16 NOTE — Progress Notes (Signed)
Pt requested hydroxyzine before going to bed last night for anxiety. BP a little low (102/62), MD paged, nighttime lopressor not given.  0030, pt complained of feeling "jittery" and unable to fall asleep, stated tylenol helps her relax. Vitals stable. Tylenol given.   This morning, pt says she feels weak and did not sleep last night. Vitals still stable, breathing unlabored. Pt requested robitussin that she takes at home, ordered.   Will monitor.

## 2019-06-16 NOTE — Progress Notes (Signed)
Patient receives word that she is ready to be discharged today, from MD during RN report. Patient calls live-in niece, who states they are not available to coordinate this today.   Without further relay MD states they will return to discuss with the patient.   Carles Collet, Case manager is involved.

## 2019-06-16 NOTE — Progress Notes (Addendum)
Family Medicine Teaching Service Daily Progress Note Intern Pager: [phone]  Patient name: [name] White Medical record number: [id number] Date of birth: 05-May-1956 Age: 63 y.o. Gender: female  Primary Care Provider: [name], MD Consultants: None Code Status: DNR  Pt Overview and Major Events to Date:  12/8 Admitted to FPTS  Assessment and Plan: [name] White is a 63 y.o. female presenting with shortness of breath.PMH is significant for HFpEF,COPD/asthma,obesity, OHS not on CPAP, PAF on Eliquis, HTN, MDD, vocal cord dysfunction, H/o VTE in 2015.  Acute on Chronic Respiratory Failure Exacerbation of Chronic Lung Disease Improved.  COVID neg x2.  Patient with underlying vocal cord dysfunction, which can also contribute to her symptoms.  Home O2 requirement of 3-4 L, on 3L O2 overnight with saturations in the mid to high 90s.  Declined CPAP overnight after experiencing anxiety with it the night before.  She has remained afebrile after initially being febrile to 100.4 in the ED on admission.  WBC mildly increased from 11.4>13.7>14.3 likely with steroid administration.  Weight is 358 pounds, slightly up from reported dry weight of 350 pounds.  Currently on Lasix 80 mg PO after diuresing 2.4 L on 80 mg IV followed by 40 mg p.o. on 12/18.  Urine output only partially recorded over the last 24 hours.  S/P vanc and cefepime on 12/18, was transition to doxycycline 100 mg twice daily on 12/19.   -S/p vancomycin and cefepime (12/18) -Continue doxycyline 100mg  BID (12/19-12/24) -S/p Solu-Medrol, cont prednisone 40 mg x 5 days (12/19-12/22) -Discharged on Lasix 80 PO QD, which is double her previous home dose -Will also discharge with Kdur 40 mEq daily  -Strict I's and O's -Daily weights -Home with HH PT  Hypomagnesemia and hypokalemia: Resolved K2.8>3.2>4.0>3.4, Mag 1.6>2.3>2.0.  Goal of K greater than 4 and magnesium greater than 2 given flecainide use. QTc 462 this AM. -  supplement with Kdur 40 mEq prior to discharge  HTN Remains normotensive. Home meds diltiazem, metoprolol.  Metoprolol held on 12/19 p.m. due to BP of 103/57. -Continue home meds  PAF onEliquis Prior VTE Sinus rhythm on EKG this AM.  On diltiazem, metoprolol, flecainide at home.  History of PE in 2015, compliant with Eliquis. -Continue home Eliquis, diltiazem, metoprolol, flecainide  MDD Home meds, Effexor 150 mg daily, hydroxyzine as needed. -Continue home meds  Prior GI bleed Hospitalized 03/05/19-03/07/2019 likely secondary to diverticular bleed as well as abdominal hematoma sustained s/p fall. Hgb 10.9 today and stable. -Monitor hemoglobin  Prediabetes   Last A1c 6.3 03/19/2019, 7.8 on admission.  Currently not on any chronic antidiabetic medication.  Patient frequently receives doses of steroids for her respiratory complaints, therefore this is likely cause of her elevated sugars.  CBGs overnight moderately well controlled with most in the upper 100s on sensitive sliding scale insulin.. -sSSI with HS coverage while inpatient on steroids  Vocal cord dysfunction Has previously worked with [employer] improvement  Prior homelessness Noted per chart review. Currently living with her niece.  FEN/GI: Heart healthy PPx: Home Eliquis  Disposition: pending clinical improvement, lives with niece   Subjective:  [name] White says that she is feeling some better today, although she is not completely back to normal.  She feels comfortable with going home today on her regimen of doxycycline, prednisone, and increase Lasix.  Objective: Temp:  [97.4 F (36.3 C)-98.1 F (36.7 C)] 97.4 F (36.3 C) (12/20 0530) Pulse Rate:  [86-102] 93 (12/20 0530) Resp:  [16-26] 25 (12/20 0530) BP: (102-130)/(57-75)  107/68 (12/20 0530) SpO2:  [94 %-98 %] 98 % (12/20 0530) FiO2 (%):  [100 %] 100 % (12/19 1439) Weight:  [163.8 kg] 163.8 kg (12/20 0530)  Physical Exam:  General: 63 y.o. female,  obese, comfortable appearing, lying in bed Cardio: RRR, no MRG Lungs: CTA B with upper airway sounds, on 3 L O2, mildly tachypneic Skin: Warm and dry Extremities: 1+ bilateral lower extremity edema to knees   Laboratory: Recent Labs  Lab 06/14/19 0652 06/15/19 0345  WBC 11.4* 13.7*  HGB 10.8* 10.9*  HCT 36.0 36.3  PLT 466* 489*   Recent Labs  Lab 06/14/19 0652 06/14/19 1717 06/15/19 0345  NA 139 139 143  K 2.8* 3.2* 4.0  CL 97* 97* 101  CO2 27 30 33*  BUN 9 8 12   CREATININE 0.71 0.96 0.67  CALCIUM 8.5* 8.2* 8.3*  PROT 7.6  --   --   BILITOT 0.3  --   --   ALKPHOS 81  --   --   ALT 23  --   --   AST 24  --   --   GLUCOSE 135* 307* 204*    Imaging/Diagnostic Tests: No results found.  Kathrene Alu, MD 06/16/2019, 5:44 AM PGY-3, [place] Intern pager: [phone], text pages welcome

## 2019-06-16 NOTE — Progress Notes (Signed)
Call placed to [facility] to notify of telemetry monitoring d/c.   

## 2019-06-16 NOTE — Progress Notes (Signed)
PTAR arrives to pick up patient, patient spoke with her family directly, so they are aware of her discharge/transport to home.   DNR form completed by MD, provided for PTAR.   AVS within PTAR forms, pt aware.  Pt belongings gathered/bagged.

## 2019-06-17 ENCOUNTER — Other Ambulatory Visit: Payer: Self-pay

## 2019-06-17 ENCOUNTER — Encounter: Payer: Medicare HMO | Admitting: Primary Care

## 2019-06-17 DIAGNOSIS — G2581 Restless legs syndrome: Secondary | ICD-10-CM | POA: Diagnosis not present

## 2019-06-17 DIAGNOSIS — I110 Hypertensive heart disease with heart failure: Secondary | ICD-10-CM | POA: Diagnosis not present

## 2019-06-17 DIAGNOSIS — J441 Chronic obstructive pulmonary disease with (acute) exacerbation: Secondary | ICD-10-CM | POA: Diagnosis not present

## 2019-06-17 DIAGNOSIS — I480 Paroxysmal atrial fibrillation: Secondary | ICD-10-CM | POA: Diagnosis not present

## 2019-06-17 DIAGNOSIS — E662 Morbid (severe) obesity with alveolar hypoventilation: Secondary | ICD-10-CM | POA: Diagnosis not present

## 2019-06-17 DIAGNOSIS — I5033 Acute on chronic diastolic (congestive) heart failure: Secondary | ICD-10-CM | POA: Diagnosis not present

## 2019-06-17 DIAGNOSIS — I4892 Unspecified atrial flutter: Secondary | ICD-10-CM | POA: Diagnosis not present

## 2019-06-17 DIAGNOSIS — M170 Bilateral primary osteoarthritis of knee: Secondary | ICD-10-CM | POA: Diagnosis not present

## 2019-06-17 DIAGNOSIS — J9621 Acute and chronic respiratory failure with hypoxia: Secondary | ICD-10-CM | POA: Diagnosis not present

## 2019-06-17 NOTE — Progress Notes (Signed)
 Virtual Visit via Telephone Note  I connected with [name] White on 06/17/19 at  2:30 PM EST by telephone and verified that I am speaking with the correct person using two identifiers.  Location: Patient: Home Provider: Office   I discussed the limitations, risks, security and privacy concerns of performing an evaluation and management service by telephone and the availability of in person appointments. I also discussed with the patient that there may be a patient responsible charge related to this service. The patient expressed understanding and agreed to proceed.   History of Present Illness:  63 year old female, former smoker. PMH significant for vocal cord dysfunction, sleep apnea, obesity hypoventilation syndrome, asthma/COPD, acute on chronic respiratory failure with hypoxemia, GERD, Afib (on anticoagulation), HTN. Patient of Dr. Shelah, last seen by pulmonary NP on 10/16/18. Wheezing felt to be related to upper airway. No obstruction or BD response on PFTs. She has been referred to speech therapy, recommend max GERD and PND treatment for VCD. Needs sleep study.   Previous [facility] pulmonary encounter: 05/09/2019 Patient presents today for acute office vsiit. Reports increased shortness of breath and wheezing over the last couple of months. She has a tight/congested cough, unable to produce mucus. She was restarted on Symbicort  on 05/04/19 by facility. She is taking Flonase  nasal spray, Claritin  and Pepcid  as instructed. On Lasix  twice day. BNP 19 on most recent lab work. Patient declined ED evaluation. VSS. Afebrile.   05/13/2019 Patient presents today for 4-5 day follow-up for acute bronchitis with bronchospasm and vocal cord dysfunction. She was given Depo-medrol  shot in office and started on high dose prednisone  taper and well as course of Doxycycline . Recommended max PND and GERD treatment for VCD. She is doing much better today. States that she is able to breath easier. Tessalon  perles  have not helped with cough suppression.   06/17/2019 Patient contacted today for HFU. Admitted on 12/18 with shortness of breath, hypoxia and temp 100.4 . Initally requiring 5L oxygen . She was mildly fluid overloaded, CXR showed no infiltrates or edema. BNP was 43, given lasix  80mg  x1, solu-medrol  and vanco/cefepime . Majority of patients symptoms are upper airway, hx vocal cord dysfunction. No COPD or asthma on PFTs. Discharged on basline O2 3L.    Observations/Objective:   Assessment and Plan:   Follow Up Instructions:    I discussed the assessment and treatment plan with the patient. The patient was provided an opportunity to ask questions and all were answered. The patient agreed with the plan and demonstrated an understanding of the instructions.   The patient was advised to call back or seek an in-person evaluation if the symptoms worsen or if the condition fails to improve as anticipated.   [name] LELON Ferrari, NP

## 2019-06-18 ENCOUNTER — Other Ambulatory Visit: Payer: Self-pay

## 2019-06-18 ENCOUNTER — Ambulatory Visit: Payer: Medicare HMO | Admitting: Family Medicine

## 2019-06-18 ENCOUNTER — Ambulatory Visit: Payer: Medicare HMO

## 2019-06-18 NOTE — Chronic Care Management (AMB) (Signed)
  Care Management   Outreach Note  06/18/2019 Name: [name] White MRN: [id number] DOB: 03/31/1956  Referred by: [name], MD Reason for referral : Care Coordination (Care Management [facility] discharge F/U)   A second unsuccessful telephone outreach was attempted today. The patient was referred to the case management team for assistance with care management and care coordination.   Follow Up Plan: The care management team will reach out to the patient again over the next 3 days.   Lazaro Arms RN, BSN, [facility] Care Management Coordinator Welcome Phone: [phone] Office: [phone] Fax: [phone]

## 2019-06-18 NOTE — Telephone Encounter (Signed)
Jerilyn calling from [facility] to check status of orders for patient.  Please advise.  Talbot Grumbling, RN

## 2019-06-19 ENCOUNTER — Telehealth (INDEPENDENT_AMBULATORY_CARE_PROVIDER_SITE_OTHER): Payer: Medicare HMO | Admitting: Family Medicine

## 2019-06-19 ENCOUNTER — Other Ambulatory Visit: Payer: Self-pay

## 2019-06-19 LAB — CULTURE, BLOOD (ROUTINE X 2)
Culture: NO GROWTH
Culture: NO GROWTH
Special Requests: ADEQUATE

## 2019-06-19 NOTE — Progress Notes (Signed)
Attempted to call patient x2. Left VM both times. Patient can reschedule if needed.  [name] MD PGY-3 Family Medicine Resident

## 2019-06-21 ENCOUNTER — Emergency Department (HOSPITAL_COMMUNITY): Payer: Medicare HMO

## 2019-06-21 ENCOUNTER — Inpatient Hospital Stay (HOSPITAL_COMMUNITY)
Admission: EM | Admit: 2019-06-21 | Discharge: 2019-07-07 | DRG: 190 | Disposition: A | Discharge status: Home Health | Payer: Medicare HMO | Attending: Family Medicine | Admitting: Family Medicine

## 2019-06-21 ENCOUNTER — Observation Stay (HOSPITAL_COMMUNITY): Payer: Medicare HMO

## 2019-06-21 ENCOUNTER — Other Ambulatory Visit: Payer: Self-pay

## 2019-06-21 ENCOUNTER — Encounter (HOSPITAL_COMMUNITY): Payer: Self-pay | Admitting: Family Medicine

## 2019-06-21 DIAGNOSIS — I152 Hypertension secondary to endocrine disorders: Secondary | ICD-10-CM | POA: Diagnosis present

## 2019-06-21 DIAGNOSIS — I480 Paroxysmal atrial fibrillation: Secondary | ICD-10-CM | POA: Diagnosis present

## 2019-06-21 DIAGNOSIS — T380X5A Adverse effect of glucocorticoids and synthetic analogues, initial encounter: Secondary | ICD-10-CM | POA: Diagnosis present

## 2019-06-21 DIAGNOSIS — J9601 Acute respiratory failure with hypoxia: Secondary | ICD-10-CM

## 2019-06-21 DIAGNOSIS — Z825 Family history of asthma and other chronic lower respiratory diseases: Secondary | ICD-10-CM

## 2019-06-21 DIAGNOSIS — J449 Chronic obstructive pulmonary disease, unspecified: Secondary | ICD-10-CM | POA: Diagnosis not present

## 2019-06-21 DIAGNOSIS — J9600 Acute respiratory failure, unspecified whether with hypoxia or hypercapnia: Secondary | ICD-10-CM | POA: Diagnosis not present

## 2019-06-21 DIAGNOSIS — R519 Headache, unspecified: Secondary | ICD-10-CM | POA: Diagnosis not present

## 2019-06-21 DIAGNOSIS — Z79899 Other long term (current) drug therapy: Secondary | ICD-10-CM

## 2019-06-21 DIAGNOSIS — Z20822 Contact with and (suspected) exposure to covid-19: Secondary | ICD-10-CM | POA: Diagnosis present

## 2019-06-21 DIAGNOSIS — J9602 Acute respiratory failure with hypercapnia: Secondary | ICD-10-CM | POA: Diagnosis not present

## 2019-06-21 DIAGNOSIS — E662 Morbid (severe) obesity with alveolar hypoventilation: Secondary | ICD-10-CM | POA: Diagnosis not present

## 2019-06-21 DIAGNOSIS — G8929 Other chronic pain: Secondary | ICD-10-CM | POA: Diagnosis present

## 2019-06-21 DIAGNOSIS — I440 Atrioventricular block, first degree: Secondary | ICD-10-CM | POA: Diagnosis present

## 2019-06-21 DIAGNOSIS — L02416 Cutaneous abscess of left lower limb: Secondary | ICD-10-CM | POA: Diagnosis present

## 2019-06-21 DIAGNOSIS — G4733 Obstructive sleep apnea (adult) (pediatric): Secondary | ICD-10-CM

## 2019-06-21 DIAGNOSIS — Z87891 Personal history of nicotine dependence: Secondary | ICD-10-CM

## 2019-06-21 DIAGNOSIS — R0682 Tachypnea, not elsewhere classified: Secondary | ICD-10-CM | POA: Diagnosis not present

## 2019-06-21 DIAGNOSIS — Z743 Need for continuous supervision: Secondary | ICD-10-CM | POA: Diagnosis not present

## 2019-06-21 DIAGNOSIS — Z882 Allergy status to sulfonamides status: Secondary | ICD-10-CM

## 2019-06-21 DIAGNOSIS — Z8249 Family history of ischemic heart disease and other diseases of the circulatory system: Secondary | ICD-10-CM

## 2019-06-21 DIAGNOSIS — K219 Gastro-esophageal reflux disease without esophagitis: Secondary | ICD-10-CM | POA: Diagnosis present

## 2019-06-21 DIAGNOSIS — F419 Anxiety disorder, unspecified: Secondary | ICD-10-CM | POA: Diagnosis present

## 2019-06-21 DIAGNOSIS — Z885 Allergy status to narcotic agent status: Secondary | ICD-10-CM

## 2019-06-21 DIAGNOSIS — Z66 Do not resuscitate: Secondary | ICD-10-CM | POA: Diagnosis not present

## 2019-06-21 DIAGNOSIS — J9622 Acute and chronic respiratory failure with hypercapnia: Secondary | ICD-10-CM | POA: Diagnosis not present

## 2019-06-21 DIAGNOSIS — R000 Tachycardia, unspecified: Secondary | ICD-10-CM | POA: Diagnosis not present

## 2019-06-21 DIAGNOSIS — Z90710 Acquired absence of both cervix and uterus: Secondary | ICD-10-CM

## 2019-06-21 DIAGNOSIS — R509 Fever, unspecified: Secondary | ICD-10-CM | POA: Diagnosis not present

## 2019-06-21 DIAGNOSIS — F329 Major depressive disorder, single episode, unspecified: Secondary | ICD-10-CM | POA: Diagnosis present

## 2019-06-21 DIAGNOSIS — I5032 Chronic diastolic (congestive) heart failure: Secondary | ICD-10-CM | POA: Diagnosis present

## 2019-06-21 DIAGNOSIS — Z6841 Body Mass Index (BMI) 40.0 and over, adult: Secondary | ICD-10-CM | POA: Diagnosis not present

## 2019-06-21 DIAGNOSIS — J440 Chronic obstructive pulmonary disease with acute lower respiratory infection: Principal | ICD-10-CM | POA: Diagnosis present

## 2019-06-21 DIAGNOSIS — R069 Unspecified abnormalities of breathing: Secondary | ICD-10-CM | POA: Diagnosis not present

## 2019-06-21 DIAGNOSIS — Z7901 Long term (current) use of anticoagulants: Secondary | ICD-10-CM

## 2019-06-21 DIAGNOSIS — G43909 Migraine, unspecified, not intractable, without status migrainosus: Secondary | ICD-10-CM | POA: Diagnosis present

## 2019-06-21 DIAGNOSIS — J189 Pneumonia, unspecified organism: Secondary | ICD-10-CM | POA: Diagnosis present

## 2019-06-21 DIAGNOSIS — J9612 Chronic respiratory failure with hypercapnia: Secondary | ICD-10-CM | POA: Diagnosis not present

## 2019-06-21 DIAGNOSIS — E1169 Type 2 diabetes mellitus with other specified complication: Secondary | ICD-10-CM | POA: Diagnosis present

## 2019-06-21 DIAGNOSIS — M170 Bilateral primary osteoarthritis of knee: Secondary | ICD-10-CM | POA: Diagnosis present

## 2019-06-21 DIAGNOSIS — S31829A Unspecified open wound of left buttock, initial encounter: Secondary | ICD-10-CM | POA: Diagnosis not present

## 2019-06-21 DIAGNOSIS — J9611 Chronic respiratory failure with hypoxia: Secondary | ICD-10-CM | POA: Diagnosis not present

## 2019-06-21 DIAGNOSIS — J441 Chronic obstructive pulmonary disease with (acute) exacerbation: Secondary | ICD-10-CM

## 2019-06-21 DIAGNOSIS — Z7951 Long term (current) use of inhaled steroids: Secondary | ICD-10-CM

## 2019-06-21 DIAGNOSIS — Z20828 Contact with and (suspected) exposure to other viral communicable diseases: Secondary | ICD-10-CM | POA: Diagnosis not present

## 2019-06-21 DIAGNOSIS — Z86711 Personal history of pulmonary embolism: Secondary | ICD-10-CM

## 2019-06-21 DIAGNOSIS — J383 Other diseases of vocal cords: Secondary | ICD-10-CM

## 2019-06-21 DIAGNOSIS — R0689 Other abnormalities of breathing: Secondary | ICD-10-CM | POA: Diagnosis not present

## 2019-06-21 DIAGNOSIS — J439 Emphysema, unspecified: Secondary | ICD-10-CM | POA: Diagnosis not present

## 2019-06-21 DIAGNOSIS — M545 Low back pain: Secondary | ICD-10-CM | POA: Diagnosis present

## 2019-06-21 DIAGNOSIS — E1159 Type 2 diabetes mellitus with other circulatory complications: Secondary | ICD-10-CM

## 2019-06-21 DIAGNOSIS — N3281 Overactive bladder: Secondary | ICD-10-CM | POA: Diagnosis not present

## 2019-06-21 DIAGNOSIS — R0602 Shortness of breath: Secondary | ICD-10-CM | POA: Diagnosis not present

## 2019-06-21 DIAGNOSIS — Z9981 Dependence on supplemental oxygen: Secondary | ICD-10-CM

## 2019-06-21 DIAGNOSIS — R0902 Hypoxemia: Secondary | ICD-10-CM | POA: Diagnosis not present

## 2019-06-21 DIAGNOSIS — R279 Unspecified lack of coordination: Secondary | ICD-10-CM | POA: Diagnosis not present

## 2019-06-21 DIAGNOSIS — I10 Essential (primary) hypertension: Secondary | ICD-10-CM | POA: Diagnosis not present

## 2019-06-21 DIAGNOSIS — J9621 Acute and chronic respiratory failure with hypoxia: Secondary | ICD-10-CM | POA: Diagnosis not present

## 2019-06-21 LAB — BLOOD GAS, ARTERIAL
Acid-Base Excess: 10.9 mmol/L — ABNORMAL HIGH (ref 0.0–2.0)
Bicarbonate: 36.5 mmol/L — ABNORMAL HIGH (ref 20.0–28.0)
FIO2: 100
O2 Saturation: 97.1 %
Patient temperature: 37
pCO2 arterial: 65.3 mmHg (ref 32.0–48.0)
pH, Arterial: 7.367 (ref 7.350–7.450)
pO2, Arterial: 95.3 mmHg (ref 83.0–108.0)

## 2019-06-21 LAB — URINALYSIS, ROUTINE W REFLEX MICROSCOPIC
Bilirubin Urine: NEGATIVE
Glucose, UA: NEGATIVE mg/dL
Hgb urine dipstick: NEGATIVE
Ketones, ur: NEGATIVE mg/dL
Nitrite: NEGATIVE
Protein, ur: NEGATIVE mg/dL
Specific Gravity, Urine: 1.009 (ref 1.005–1.030)
pH: 6 (ref 5.0–8.0)

## 2019-06-21 LAB — BASIC METABOLIC PANEL
Anion gap: 8 (ref 5–15)
BUN: 10 mg/dL (ref 8–23)
CO2: 36 mmol/L — ABNORMAL HIGH (ref 22–32)
Calcium: 8.2 mg/dL — ABNORMAL LOW (ref 8.9–10.3)
Chloride: 96 mmol/L — ABNORMAL LOW (ref 98–111)
Creatinine, Ser: 0.54 mg/dL (ref 0.44–1.00)
GFR calc Af Amer: >60 mL/min (ref >60)
GFR calc non Af Amer: >60 mL/min (ref >60)
Glucose, Bld: 130 mg/dL — ABNORMAL HIGH (ref 70–99)
Potassium: 3.8 mmol/L (ref 3.5–5.1)
Sodium: 140 mmol/L (ref 135–145)

## 2019-06-21 LAB — POCT I-STAT EG7
Acid-Base Excess: 13 mmol/L — ABNORMAL HIGH (ref 0.0–2.0)
Bicarbonate: 40.8 mmol/L — ABNORMAL HIGH (ref 20.0–28.0)
Calcium, Ion: 1.12 mmol/L — ABNORMAL LOW (ref 1.15–1.40)
HCT: 33 % — ABNORMAL LOW (ref 36.0–46.0)
Hemoglobin: 11.2 g/dL — ABNORMAL LOW (ref 12.0–15.0)
O2 Saturation: 95 %
Potassium: 4.3 mmol/L (ref 3.5–5.1)
Sodium: 138 mmol/L (ref 135–145)
TCO2: 43 mmol/L — ABNORMAL HIGH (ref 22–32)
pCO2, Ven: 69.9 mmHg — ABNORMAL HIGH (ref 44.0–60.0)
pH, Ven: 7.373 (ref 7.250–7.430)
pO2, Ven: 84 mmHg — ABNORMAL HIGH (ref 32.0–45.0)

## 2019-06-21 LAB — CBC WITH DIFFERENTIAL/PLATELET
Abs Immature Granulocytes: 0.19 10*3/uL — ABNORMAL HIGH (ref 0.00–0.07)
Basophils Absolute: 0.1 10*3/uL (ref 0.0–0.1)
Basophils Relative: 1 %
Eosinophils Absolute: 0.5 10*3/uL (ref 0.0–0.5)
Eosinophils Relative: 5 %
HCT: 34.9 % — ABNORMAL LOW (ref 36.0–46.0)
Hemoglobin: 10.1 g/dL — ABNORMAL LOW (ref 12.0–15.0)
Immature Granulocytes: 2 %
Lymphocytes Relative: 15 %
Lymphs Abs: 1.7 10*3/uL (ref 0.7–4.0)
MCH: 26.6 pg (ref 26.0–34.0)
MCHC: 28.9 g/dL — ABNORMAL LOW (ref 30.0–36.0)
MCV: 91.8 fL (ref 80.0–100.0)
Monocytes Absolute: 0.7 10*3/uL (ref 0.1–1.0)
Monocytes Relative: 6 %
Neutro Abs: 7.9 10*3/uL — ABNORMAL HIGH (ref 1.7–7.7)
Neutrophils Relative %: 71 %
Platelets: 617 10*3/uL — ABNORMAL HIGH (ref 150–400)
RBC: 3.8 MIL/uL — ABNORMAL LOW (ref 3.87–5.11)
RDW: 18.9 % — ABNORMAL HIGH (ref 11.5–15.5)
WBC: 11 10*3/uL — ABNORMAL HIGH (ref 4.0–10.5)
nRBC: 0.4 % — ABNORMAL HIGH (ref 0.0–0.2)

## 2019-06-21 LAB — POCT I-STAT 7, (LYTES, BLD GAS, ICA,H+H)
Acid-Base Excess: 11 mmol/L — ABNORMAL HIGH (ref 0.0–2.0)
Bicarbonate: 39.1 mmol/L — ABNORMAL HIGH (ref 20.0–28.0)
Calcium, Ion: 1.15 mmol/L (ref 1.15–1.40)
HCT: 33 % — ABNORMAL LOW (ref 36.0–46.0)
Hemoglobin: 11.2 g/dL — ABNORMAL LOW (ref 12.0–15.0)
O2 Saturation: 99 %
Patient temperature: 99.2
Potassium: 3.9 mmol/L (ref 3.5–5.1)
Sodium: 139 mmol/L (ref 135–145)
TCO2: 41 mmol/L — ABNORMAL HIGH (ref 22–32)
pCO2 arterial: 75.9 mmHg (ref 32.0–48.0)
pH, Arterial: 7.321 — ABNORMAL LOW (ref 7.350–7.450)
pO2, Arterial: 148 mmHg — ABNORMAL HIGH (ref 83.0–108.0)

## 2019-06-21 LAB — BRAIN NATRIURETIC PEPTIDE: B Natriuretic Peptide: 88.3 pg/mL (ref 0.0–100.0)

## 2019-06-21 LAB — RAPID URINE DRUG SCREEN, HOSP PERFORMED
Amphetamines: NOT DETECTED
Barbiturates: NOT DETECTED
Benzodiazepines: NOT DETECTED
Cocaine: NOT DETECTED
Opiates: NOT DETECTED
Tetrahydrocannabinol: NOT DETECTED

## 2019-06-21 LAB — TROPONIN I (HIGH SENSITIVITY): Troponin I (High Sensitivity): 9 ng/L (ref <18)

## 2019-06-21 LAB — POC SARS CORONAVIRUS 2 AG -  ED: SARS Coronavirus 2 Ag: NEGATIVE

## 2019-06-21 LAB — SARS CORONAVIRUS 2 (TAT 6-24 HRS): SARS Coronavirus 2: NEGATIVE

## 2019-06-21 LAB — CBG MONITORING, ED
Glucose-Capillary: 227 mg/dL — ABNORMAL HIGH (ref 70–99)
Glucose-Capillary: 209 mg/dL — ABNORMAL HIGH (ref 70–99)
Glucose-Capillary: 190 mg/dL — ABNORMAL HIGH (ref 70–99)

## 2019-06-21 LAB — D-DIMER, QUANTITATIVE: D-Dimer, Quant: 0.7 ug/mL-FEU — ABNORMAL HIGH (ref 0.00–0.50)

## 2019-06-21 MED ORDER — VENLAFAXINE HCL ER 150 MG PO CP24: 150.0000 mg | ORAL_CAPSULE | Freq: Every day | ORAL | Status: DC

## 2019-06-21 MED ORDER — AZELASTINE HCL 0.1 % NA SOLN
2.0000 | Freq: Every day | NASAL | Status: DC
  Administered 2019-06-22 – 2019-07-07 (×15): 2 via NASAL
  Filled 2019-06-21 (×2): qty 30

## 2019-06-21 MED ORDER — ACETAMINOPHEN 325 MG PO TABS
650.0000 mg | ORAL_TABLET | Freq: Four times a day (QID) | ORAL | Status: DC | PRN
  Administered 2019-06-22 – 2019-07-01 (×19): 650 mg via ORAL
  Filled 2019-06-21 (×19): qty 2

## 2019-06-21 MED ORDER — ALBUTEROL (5 MG/ML) CONTINUOUS INHALATION SOLN
15.0000 mg/h | INHALATION_SOLUTION | Freq: Once | RESPIRATORY_TRACT | Status: AC
  Administered 2019-06-21: 15 mg/h via RESPIRATORY_TRACT
  Filled 2019-06-21: qty 20

## 2019-06-21 MED ORDER — METOPROLOL TARTRATE 12.5 MG HALF TABLET
12.5000 mg | ORAL_TABLET | Freq: Two times a day (BID) | ORAL | Status: DC
  Administered 2019-06-21 – 2019-07-07 (×33): 12.5 mg via ORAL
  Filled 2019-06-21 (×33): qty 1

## 2019-06-21 MED ORDER — FUROSEMIDE 10 MG/ML IJ SOLN
40.0000 mg | Freq: Once | INTRAMUSCULAR | Status: AC
  Administered 2019-06-21: 40 mg via INTRAVENOUS
  Filled 2019-06-21: qty 4

## 2019-06-21 MED ORDER — MAGNESIUM SULFATE 2 GM/50ML IV SOLN
2.0000 g | Freq: Once | INTRAVENOUS | Status: AC
  Administered 2019-06-21: 2 g via INTRAVENOUS
  Filled 2019-06-21: qty 50

## 2019-06-21 MED ORDER — VENLAFAXINE HCL ER 75 MG PO CP24
150.0000 mg | ORAL_CAPSULE | Freq: Every day | ORAL | Status: DC
  Administered 2019-06-21 – 2019-07-07 (×17): 150 mg via ORAL
  Filled 2019-06-21 (×14): qty 2
  Filled 2019-06-21: qty 1
  Filled 2019-06-21 (×2): qty 2
  Filled 2019-06-21: qty 1

## 2019-06-21 MED ORDER — LORATADINE 10 MG PO TABS
10.0000 mg | ORAL_TABLET | Freq: Every day | ORAL | Status: DC
  Administered 2019-06-21 – 2019-07-07 (×17): 10 mg via ORAL
  Filled 2019-06-21 (×18): qty 1

## 2019-06-21 MED ORDER — ONDANSETRON HCL 4 MG/2ML IJ SOLN: 4.0000 mg | Freq: Four times a day (QID) | INTRAMUSCULAR | Status: DC | PRN

## 2019-06-21 MED ORDER — GUAIFENESIN-DM 100-10 MG/5ML PO SYRP
5.0000 mL | ORAL_SOLUTION | ORAL | Status: DC | PRN
  Administered 2019-06-25 – 2019-07-06 (×13): 5 mL via ORAL
  Filled 2019-06-21 (×13): qty 5

## 2019-06-21 MED ORDER — IPRATROPIUM BROMIDE 0.02 % IN SOLN
1.0000 mg | Freq: Once | RESPIRATORY_TRACT | Status: AC
  Administered 2019-06-21: 1 mg via RESPIRATORY_TRACT
  Filled 2019-06-21: qty 5

## 2019-06-21 MED ORDER — METHYLPREDNISOLONE SODIUM SUCC 125 MG IJ SOLR
125.0000 mg | Freq: Once | INTRAMUSCULAR | Status: AC
  Administered 2019-06-21: 125 mg via INTRAVENOUS
  Filled 2019-06-21: qty 2

## 2019-06-21 MED ORDER — FAMOTIDINE 20 MG PO TABS
20.0000 mg | ORAL_TABLET | Freq: Every day | ORAL | Status: DC
  Administered 2019-06-21 – 2019-07-07 (×17): 20 mg via ORAL
  Filled 2019-06-21 (×17): qty 1

## 2019-06-21 MED ORDER — APIXABAN 5 MG PO TABS
5.0000 mg | ORAL_TABLET | Freq: Two times a day (BID) | ORAL | Status: DC
  Administered 2019-06-21 – 2019-07-07 (×33): 5 mg via ORAL
  Filled 2019-06-21 (×34): qty 1

## 2019-06-21 MED ORDER — FLECAINIDE ACETATE 100 MG PO TABS
100.0000 mg | ORAL_TABLET | Freq: Two times a day (BID) | ORAL | Status: DC
  Administered 2019-06-21 – 2019-07-07 (×33): 100 mg via ORAL
  Filled 2019-06-21 (×36): qty 1

## 2019-06-21 MED ORDER — POLYETHYLENE GLYCOL 3350 17 G PO PACK
17.0000 g | PACK | Freq: Every day | ORAL | Status: DC | PRN
  Filled 2019-06-21: qty 1

## 2019-06-21 MED ORDER — CYCLOSPORINE 0.05 % OP EMUL
1.0000 [drp] | Freq: Two times a day (BID) | OPHTHALMIC | Status: DC
  Administered 2019-06-21 – 2019-07-07 (×33): 1 [drp] via OPHTHALMIC
  Filled 2019-06-21 (×37): qty 30

## 2019-06-21 MED ORDER — FLUTICASONE PROPIONATE 50 MCG/ACT NA SUSP
2.0000 | Freq: Every day | NASAL | Status: DC
  Administered 2019-06-22 – 2019-07-07 (×15): 2 via NASAL
  Filled 2019-06-21 (×2): qty 16

## 2019-06-21 MED ORDER — PANTOPRAZOLE SODIUM 40 MG PO TBEC
40.0000 mg | DELAYED_RELEASE_TABLET | Freq: Every day | ORAL | Status: DC
  Administered 2019-06-21 – 2019-07-07 (×17): 40 mg via ORAL
  Filled 2019-06-21 (×17): qty 1

## 2019-06-21 MED ORDER — IPRATROPIUM-ALBUTEROL 0.5-2.5 (3) MG/3ML IN SOLN
3.0000 mL | Freq: Three times a day (TID) | RESPIRATORY_TRACT | Status: DC
  Administered 2019-06-22 – 2019-06-26 (×12): 3 mL via RESPIRATORY_TRACT
  Filled 2019-06-21 (×12): qty 3

## 2019-06-21 MED ORDER — VENLAFAXINE HCL 37.5 MG PO TABS
37.5000 mg | ORAL_TABLET | Freq: Every day | ORAL | Status: DC
  Administered 2019-06-21 – 2019-07-07 (×17): 37.5 mg via ORAL
  Filled 2019-06-21 (×18): qty 1

## 2019-06-21 MED ORDER — IOHEXOL 350 MG/ML SOLN
75.0000 mL | Freq: Once | INTRAVENOUS | Status: AC | PRN
  Administered 2019-06-21: 75 mL via INTRAVENOUS

## 2019-06-21 MED ORDER — MOMETASONE FURO-FORMOTEROL FUM 100-5 MCG/ACT IN AERO
2.0000 | INHALATION_SPRAY | Freq: Two times a day (BID) | RESPIRATORY_TRACT | Status: DC
  Filled 2019-06-21: qty 8.8

## 2019-06-21 MED ORDER — IPRATROPIUM-ALBUTEROL 0.5-2.5 (3) MG/3ML IN SOLN: 3.0000 mL | Freq: Three times a day (TID) | RESPIRATORY_TRACT | Status: DC

## 2019-06-21 MED ORDER — VENLAFAXINE HCL 37.5 MG PO TABS: 37.5000 mg | ORAL_TABLET | Freq: Every day | ORAL | Status: DC

## 2019-06-21 MED ORDER — ONDANSETRON HCL 4 MG PO TABS: 4.0000 mg | ORAL_TABLET | Freq: Four times a day (QID) | ORAL | Status: DC | PRN

## 2019-06-21 MED ORDER — ALBUTEROL SULFATE (2.5 MG/3ML) 0.083% IN NEBU
2.5000 mg | INHALATION_SOLUTION | RESPIRATORY_TRACT | Status: DC | PRN
  Administered 2019-06-22 – 2019-07-06 (×23): 2.5 mg via RESPIRATORY_TRACT
  Filled 2019-06-21 (×22): qty 3

## 2019-06-21 MED ORDER — MONTELUKAST SODIUM 10 MG PO TABS
10.0000 mg | ORAL_TABLET | Freq: Every day | ORAL | Status: DC
  Administered 2019-06-21 – 2019-07-06 (×16): 10 mg via ORAL
  Filled 2019-06-21 (×17): qty 1

## 2019-06-21 MED ORDER — ACETAMINOPHEN 650 MG RE SUPP: 650.0000 mg | Freq: Four times a day (QID) | RECTAL | Status: DC | PRN

## 2019-06-21 MED ORDER — FUROSEMIDE 80 MG PO TABS
80.0000 mg | ORAL_TABLET | Freq: Every day | ORAL | Status: DC
  Administered 2019-06-21 – 2019-07-07 (×17): 80 mg via ORAL
  Filled 2019-06-21 (×7): qty 1
  Filled 2019-06-21: qty 4
  Filled 2019-06-21 (×10): qty 1

## 2019-06-21 MED ORDER — ALBUTEROL SULFATE HFA 108 (90 BASE) MCG/ACT IN AERS
8.0000 | INHALATION_SPRAY | Freq: Once | RESPIRATORY_TRACT | Status: AC
  Administered 2019-06-21: 8 via RESPIRATORY_TRACT

## 2019-06-21 MED ORDER — DILTIAZEM HCL ER COATED BEADS 120 MG PO CP24
120.0000 mg | ORAL_CAPSULE | Freq: Every day | ORAL | Status: DC
  Administered 2019-06-21 – 2019-07-06 (×16): 120 mg via ORAL
  Filled 2019-06-21 (×17): qty 1

## 2019-06-21 MED ORDER — POTASSIUM CHLORIDE CRYS ER 20 MEQ PO TBCR
40.0000 meq | EXTENDED_RELEASE_TABLET | Freq: Two times a day (BID) | ORAL | Status: DC
  Administered 2019-06-21 – 2019-07-07 (×33): 40 meq via ORAL
  Filled 2019-06-21 (×34): qty 2

## 2019-06-21 MED ORDER — IPRATROPIUM-ALBUTEROL 0.5-2.5 (3) MG/3ML IN SOLN
3.0000 mL | RESPIRATORY_TRACT | Status: DC
  Administered 2019-06-21 (×2): 3 mL via RESPIRATORY_TRACT
  Filled 2019-06-21 (×2): qty 3

## 2019-06-21 MED ORDER — HYDROXYZINE HCL 25 MG PO TABS
25.0000 mg | ORAL_TABLET | Freq: Two times a day (BID) | ORAL | Status: DC | PRN
  Administered 2019-06-21 – 2019-07-06 (×14): 25 mg via ORAL
  Filled 2019-06-21 (×14): qty 1

## 2019-06-21 MED ORDER — INSULIN ASPART 100 UNIT/ML ~~LOC~~ SOLN
0.0000 [IU] | Freq: Three times a day (TID) | SUBCUTANEOUS | Status: DC
  Administered 2019-06-21: 2 [IU] via SUBCUTANEOUS
  Administered 2019-06-21 – 2019-06-22 (×3): 1 [IU] via SUBCUTANEOUS
  Administered 2019-06-23 (×2): 2 [IU] via SUBCUTANEOUS
  Administered 2019-06-24: 1 [IU] via SUBCUTANEOUS
  Administered 2019-06-24 – 2019-06-26 (×2): 2 [IU] via SUBCUTANEOUS
  Administered 2019-06-28: 3 [IU] via SUBCUTANEOUS
  Administered 2019-06-28: 2 [IU] via SUBCUTANEOUS

## 2019-06-21 MED ORDER — DICLOFENAC SODIUM 1 % TD GEL
2.0000 g | Freq: Four times a day (QID) | TRANSDERMAL | Status: DC | PRN
  Administered 2019-06-22 – 2019-06-24 (×4): 2 g via TOPICAL
  Filled 2019-06-21 (×2): qty 100

## 2019-06-21 NOTE — ED Notes (Signed)
Patient transported to CT 

## 2019-06-21 NOTE — ED Triage Notes (Signed)
Pt recently admitted here for hypoxia. Per EMS, pt called out tonight for resp distress and upon arrival, pt was not wearing her oxygen.

## 2019-06-21 NOTE — Progress Notes (Addendum)
Critical blood gas Pco2-65.3 MD made aware.

## 2019-06-21 NOTE — Progress Notes (Signed)
PT refused ABG

## 2019-06-21 NOTE — Progress Notes (Signed)
FPTS Interim Progress Note  Patient notes that patient somnolent, patient seen and examined at bedside.  On BiPAP.  Upon walking into the room, patient was sleeping, arouses to her name only.  Woke up, was alert.  Having conversation, A&O x4.  Noted that she was incredibly tired because she had not slept in 2 days due to having difficulty breathing at home.  Stated that she felt that her breathing had improved while being on BiPAP.  Also notes that she is incredibly hungry and would like to have something to eat.  States that she feels overall improved.  Given that she is talking with me and appropriate, with previously improved ABG, okay to take patient off BiPAP to eat if she is able to be awake enough to eat, then place back on BiPAP.  Will need repeat ABG later today.   [place], DO 06/21/2019, 1:51 PM PGY-2, [place] Service pager [phone]

## 2019-06-21 NOTE — H&P (Addendum)
[facility] Admission History and Physical Service Pager: [phone]  Patient name: [name] White Medical record number: [id number] Date of birth: 14-Jan-1956 Age: 63 y.o. Gender: female  Primary Care Provider: [name], MD Consultants: None Code Status: DNR Preferred Emergency Contact: [name] White (niece)  Chief Complaint: can't breath  Assessment and Plan: [name] White is a 63 y.o. female presenting with shortness of breath. PMH is significant for Right Vocal cord dysfunction s/p partial Thyroidectomy, COPD/Asthma, HTN, hx of PE, A.Fib on Eliquis, Anxiety, Depression, Chronic Back Pain, GERD, Falls   Acute Hypoxic/Hypercapneic Respiratory Failure Etiology unclear and likely multifactorial. Differential diagnosis include COPD exacerbation, COVID 19, volume overload, Anxiety, Non compliance with continuous oxygen, OSA/OHA, PHTN.  Less likely PE as patient compliant with anticoagulation medication and no chest pain, D-Dimer 0.70 and Geneva score 9 given h/o PE.Chest xray negative for active pulmonary disease.  Rapid COVID negative however send out test pending. WBC 11.0  ABG pH 7.321, pCO2 75.9, HCO3 39.1 base excess 11.0 consistent with resp acidosis with compensation.On exam pt on BiPAP with increased work of breathing, tachypnea, RR 24. Lung exam CTAB, good cap refill.   Low suspicion for COPD exacerbation based on good air movement and lack of wheezing on exam, no evidence of hyperinflation on chest x-ray, PFTs that do not support COPD. Low suspicion for pneumonia or septic etiology at this time, although we will get a UA based on symptoms of polyuria and urinary urgency. Most likely due to the accumulation of of above-noted chronic diseases (OSA, OHS, not using home oxygen, no home CPAP, heart failure). There may be a small element of fluid overload although I doubt this is the largest contributor to her decompensation. Will consider pulmonology consult this  admission for assessment of other underlying disease such as ILD. -Admit to Progressive, Attending Dr Owens Shark -Continue BiPAP -Discontinue steroids -S/p IV Lasix x1 -Follow-up UA -monitor resp status  -maintain O2 sat >92% -Repeat ABG -ECG pending -DuoNebs  Q4h -Consider adding albuterol prn for rescue therapy -Consider pulm consult for further evaluation SLP consulted for further assessment and treatment of vocal cord dysfunction  Chest pressure She reports a centrally located chest pressure/tightness. She denies radiation to left arm, back, jaw. This chest pressure does seem to get worse with exertion. Initial labs showed a BNP of 88 and a high sensitive troponin of 9. Unlikely cardiac related. More likely secondary to anxiety, respiratory distress, GERD. Will obtain ECG. -Follow-up ECG  OSA/OHS Pt reports does not have BiPAP at home, likely a contributing factor to respiratory distress -continue BiPAP -consider sleep study outpatient  Vocal Cord Dysfunction S/p partial thyroidectomy  Paroxysmal AFIB Home medication Digoxin 120 mg qhs, Metoprolol 12.5 mg BID, Flecanide 100 mg BID. Sinus tachycardia on monitor during exam. She may have a propensity to occasionally flip into A. fib which would likely increase her sensation of chest pressure/anxiety.  -Continue home medication  Diabetes HbA1C 7.8.  No home medication -start sss insulin -monitor CBG  PE Home medication Eliquis.  Geneva score 9, moderate risk of PE.  On exam patient has increased work of breathing, RR  14-26.  -continue Eliquis 5mg  BID -D.Dimer 0.70, consider CTA  HFpEF Most recent ECHO 12/02, LVEF 60-65%, normal LV function, severe increased LV hypertrophy.  Home medication Lasix 80 mg daily. Chest xray WNL. BNP 88.3  On exam patient appears euvolemic. -Continue home medication -give Lasix 40mg  IV x1 -daily weights -strict intake and output  HTN Normotensive. Home  medication, Metopriolol, Cardizem,  Lasix -Continue home medication  Morbid Obesity BMI 59.2 -encourage healthy lifestyle and weight management  COPD/Asthma Recent PFT's show no evidence of COPD/Asthma.  On home oxygen 6L.  Followed by Dr.Byrum, Pulmonology.  Missed last appointment.  Home medications Montelukast, Albuterol, Duo-nebs, Dulera,  -continue home medications -monitor resp status  MDD/Anxiety Pt reports being more anxious since being admitted to hospital for SOB.  Home medications Atarax 25 mg, Effexor 187.5 mg daily  -Continue home medications  GERD Asymptomatic.  Home meds Prilosec 20 mg daily and Pepcid 20 mg.  -continue home meds  FEN/GI: NPO until able to wean BiPAP Prophylaxis: Eliquis  Disposition: Admit to Progressive, Attending Dr. Owens Shark  History of Present Illness:  [name] White is a 63 y.o. female presenting with shortness of breath x 2 days.  She reports she was sitting in chair when she started to have difficulty breathing.  She describes having some chest tightness but no pain. She was using her breathing treatments 4-5 times daily with mild relief. She reports using 6L oxygen at home. Shortness of breath gradually worsened over the next day and a half and she called 911 this evening until she could be evaluated in the hospital. She denies any fever or sick contacts.  No difficulty swallowing or decrease in appetite.  Denies any diarrhea or constipation.  She last seen her pulmonologist 2 months ago.  She also endorses having increased anxiety since her previous admissions.  In the ED she was afebrile, normotensive, A.Fib RVR and on non rebreather that was advanced to BiPAP.  Labs significant for compensated respiratory failure, WBC 11.0.  Chest xray shows no active pulmonary disease.  She was given Solumedrol 125mg  IV, Magnesium 2gm and Albuterol therapy x2.  EMS reported O2 sat 60's on room air  Review Of Systems: Per HPI with the following additions:   Review of Systems  Constitutional:  Positive for malaise/fatigue. Negative for chills and fever.  HENT: Positive for congestion. Negative for hearing loss and sore throat.   Eyes: Negative for blurred vision.  Respiratory: Positive for cough, sputum production and shortness of breath.   Cardiovascular: Positive for chest pain and leg swelling. Negative for PND.  Gastrointestinal: Positive for blood in stool. Negative for abdominal pain, constipation, diarrhea, nausea and vomiting.  Genitourinary: Positive for frequency and urgency. Negative for dysuria.  Musculoskeletal: Negative for back pain and myalgias.  Neurological: Negative for dizziness, tingling, tremors and headaches.    Patient Active Problem List   Diagnosis Date Noted  . Community acquired pneumonia 06/14/2019  . Weight gain due to medication 06/05/2019  . Acute respiratory failure with hypoxia ([place]) 05/28/2019  . Bronchitis with acute wheezing 05/09/2019  . Hyperglycemia 03/19/2019  . Acute on chronic respiratory failure with hypoxemia ([employer]) 03/14/2019  . Acute on chronic heart failure with preserved ejection fraction ([place]) 03/14/2019  . Acquired thrombophilia ([place]) due to A-Fib 03/07/2019  . Heart failure with preserved ejection fraction ([place]) 03/06/2019  . Obesity hypoventilation syndrome ([place]) 03/06/2019  . Lower GI bleed   . Urge incontinence 01/27/2019  . COPD exacerbation ([place]) 08/29/2018  . COPD with acute exacerbation ([place]) 08/07/2018  . Sleep apnea 07/05/2018  . Respiratory failure ([place]) 07/05/2018  . Severe episode of recurrent major depressive disorder, without psychotic features ([place])   . MDD (major depressive disorder), recurrent episode, severe ([place]) 09/07/2015  . Atrial flutter ([place]) 07/29/2015  . Asthma 11/12/2014  . History of hypertension 11/05/2014  . Paroxysmal atrial  fibrillation ([place]) 11/03/2014  . Chronic anticoagulation 11/03/2014  . Major depressive disorder 11/03/2014  . History of pulmonary embolus (PE) 11/03/2014  . MDD  (major depressive disorder), recurrent severe, without psychosis ([place]) 03/05/2014  . Vocal cord dysfunction 11/12/2013  . Hot flashes 04/22/2011  . OVERACTIVE BLADDER 04/18/2008  . Osteoarthritis of both knees 04/18/2008  . Osteoarthritis involving multiple joints on both sides of body 09/14/2007  . Morbid obesity ([place]) 08/24/2006  . RESTLESS LEGS SYNDROME 08/24/2006  . HYPERTENSION, BENIGN SYSTEMIC 08/24/2006  . RHINITIS, ALLERGIC 08/24/2006  . GASTROESOPHAGEAL REFLUX, NO ESOPHAGITIS 08/24/2006    Past Medical History: Past Medical History:  Diagnosis Date  . Abdominal wall hematoma 03/06/2019  . Acute bronchitis 08/29/2018  . Acute on chronic respiratory failure with hypoxia ([place]) 08/29/2018  . Anxiety   . Asthma   . Chronic lower back pain   . COPD (chronic obstructive pulmonary disease) ([place])   . Depression   . Exposure to COVID-19 virus 11/02/2018  . Fall 02/2019  . Family history of anesthesia complication    "daughter; causes her to pass out afterwards"  . GERD (gastroesophageal reflux disease)   . History of atrial flutter 06/26/2015  . History of pulmonary embolus (PE) 11/03/2014  . Hypertension   . Hyperthyroidism   . Left medial tibial stress syndrome 12/26/2017  . Lower GI bleed   . Migraine    "monthly" (12/28/2013)  . Non-traumatic rhabdomyolysis 11/03/2014  . Obesity hypoventilation syndrome ([place]) 03/06/2019  . Osteoarthritis    "both knees; back of my neck; right pelvic bone" (12/28/2013)  . Paroxysmal A-fib ([place])   . Pulmonary embolism ([place]) 12/28/2013   "2 clots in each lung"    Past Surgical History: Past Surgical History:  Procedure Laterality Date  . ABDOMINAL HYSTERECTOMY    . APPENDECTOMY    . BREAST CYST EXCISION Right   . DILATION AND CURETTAGE OF UTERUS    . ELECTROPHYSIOLOGIC STUDY N/A 05/27/2015   Procedure: A-Flutter;  Surgeon: Evans Lance, MD;  Location: [facility];  Service: Cardiovascular;  Laterality: N/A;  . EXCISIONAL HEMORRHOIDECTOMY     . NASAL SINUS SURGERY  2007  . THYROIDECTOMY, PARTIAL Right 2005  . TUBAL LIGATION    . WISDOM TOOTH EXTRACTION      Social History: Social History   Tobacco Use  . Smoking status: Former Smoker    Packs/day: 0.25    Years: 20.00    Pack years: 5.00    Types: Cigarettes    Quit date: 07/17/1999    Years since quitting: 19.9  . Smokeless tobacco: Never Used  Substance Use Topics  . Alcohol use: Not Currently    Comment: "drank some in my 30's"  . Drug use: No   Additional social history:  Please also refer to relevant sections of EMR.  Family History: Family History  Problem Relation Age of Onset  . Osteoarthritis Mother   . Asthma Mother   . Heart failure Mother   . Breast cancer Daughter    Lives with family, uses walker to ambulate  Allergies and Medications: Allergies  Allergen Reactions  . Caffeine Other (See Comments)    Migraine  . Hydralazine Hcl     Other reaction(s): Other (See Comments) AKI leading to rhabdo and electrolyte abnormalities  . Hydrocodone Nausea And Vomiting    Headache  . Ciprofloxacin Hives and Rash  . Erythromycin Hives and Rash  . Lisinopril Cough  . Oxycodone Nausea And Vomiting  Headache  . Sulfamethoxazole-Trimethoprim Rash   No current facility-administered medications on file prior to encounter.   Current Outpatient Medications on File Prior to Encounter  Medication Sig Dispense Refill  . acetaminophen (TYLENOL) 325 MG tablet Take 2 tablets (650 mg total) by mouth every 6 (six) hours as needed for mild pain.    Marland Kitchen apixaban (ELIQUIS) 5 MG TABS tablet Take 1 tablet (5 mg total) by mouth 2 (two) times daily. 60 tablet 0  . azelastine (ASTELIN) 0.1 % nasal spray Place 2 sprays into both nostrils 2 (two) times daily. Use in each nostril as directed (Patient taking differently: Place 2 sprays into both nostrils daily. ) 30 mL 0  . azelastine (OPTIVAR) 0.05 % ophthalmic solution Place 1 drop into both eyes 2 (two) times daily as  needed (itchy eyes).    . cyclobenzaprine (FLEXERIL) 10 MG tablet Take 10 mg by mouth every 8 (eight) hours as needed for muscle spasms.    . cycloSPORINE (RESTASIS) 0.05 % ophthalmic emulsion Place 1 drop into both eyes 2 (two) times daily.    . diclofenac sodium (VOLTAREN) 1 % GEL APPLY 2 GRAMS EXTERNALLY TO THE AFFECTED AREA FOUR TIMES DAILY (Patient taking differently: Apply 2 g topically 4 (four) times daily as needed (arthritis pain - knees, shoulder and thighs). ) 100 g 0  . diltiazem (CARDIZEM CD) 120 MG 24 hr capsule Take 1 capsule (120 mg total) by mouth daily. (Patient taking differently: Take 120 mg by mouth at bedtime. ) 90 capsule 0  . famotidine (PEPCID) 20 MG tablet Take 1 tablet (20 mg total) by mouth daily. 90 tablet 0  . flecainide (TAMBOCOR) 100 MG tablet TAKE 1 TABLET BY MOUTH 2 TIMES A DAY. PLEASE KEEP UPCOMING APPOINTMENT IN JANUARY BEFORE ANYMORE REFILLS. (Patient taking differently: Take 100 mg by mouth 2 (two) times daily. ) 60 tablet 6  . fluticasone (FLONASE) 50 MCG/ACT nasal spray Place 2 sprays into both nostrils daily.     . furosemide (LASIX) 80 MG tablet Take 1 tablet (80 mg total) by mouth daily. 30 tablet 0  . guaiFENesin-dextromethorphan (ROBITUSSIN DM) 100-10 MG/5ML syrup Take 5 mLs by mouth every 4 (four) hours as needed for cough. 118 mL 0  . hydrOXYzine (VISTARIL) 25 MG capsule Take 1 capsule (25 mg total) by mouth 2 (two) times daily as needed for anxiety. 30 capsule 0  . ipratropium-albuterol (DUONEB) 0.5-2.5 (3) MG/3ML SOLN Take 3 mLs by nebulization 3 (three) times daily. 360 mL 3  . loratadine (CLARITIN) 10 MG tablet TAKE 1 TABLET(10 MG) BY MOUTH DAILY (Patient taking differently: Take 10 mg by mouth daily. ) 90 tablet 0  . metoprolol tartrate (LOPRESSOR) 25 MG tablet Take 0.5 tablets (12.5 mg total) by mouth 2 (two) times daily. 60 tablet 0  . mometasone-formoterol (DULERA) 100-5 MCG/ACT AERO Inhale 2 puffs into the lungs 2 (two) times daily. 13 g 0  .  montelukast (SINGULAIR) 10 MG tablet Take 1 tablet (10 mg total) by mouth at bedtime. 30 tablet 0  . omeprazole (PRILOSEC) 20 MG capsule Take 1 capsule (20 mg total) by mouth daily. 30 capsule 0  . potassium chloride SA (KLOR-CON) 20 MEQ tablet Take 2 tablets (40 mEq total) by mouth 2 (two) times daily. 120 tablet 0  . venlafaxine (EFFEXOR) 37.5 MG tablet Take 37.5 mg by mouth daily with breakfast. Take with a 150 mg ER capsule every morning    . venlafaxine XR (EFFEXOR-XR) 150 MG 24 hr capsule Take  1 capsule (150 mg total) by mouth daily with breakfast. Take 150mg  capsule with 37.5mg  tablet (Patient taking differently: Take 150 mg by mouth daily with breakfast. Take with a 37.5 mg tablet every morning)    . VENTOLIN HFA 108 (90 Base) MCG/ACT inhaler INHALE 2 PUFFS INTO THE LUNGS EVERY 6 HOURS AS NEEDED FOR WHEEZING OR SHORTNESS OF BREATH (Patient taking differently: Inhale 2 puffs into the lungs every 6 (six) hours as needed for wheezing or shortness of breath. ) 18 g 0  . albuterol (PROVENTIL) (2.5 MG/3ML) 0.083% nebulizer solution Take 3 mLs (2.5 mg total) by nebulization every 6 (six) hours as needed for wheezing or shortness of breath. (Patient not taking: Reported on 06/15/2019) 75 mL 4  . hydrocortisone (ANUSOL-HC) 25 MG suppository Place 1 suppository (25 mg total) rectally 2 (two) times daily. (Patient not taking: Reported on 06/15/2019) 12 suppository 0  . OXYGEN Inhale 3 L into the lungs continuous.       Objective: BP (!) 120/94   Pulse (!) 117   Temp 98.4 F (36.9 C) (Oral)   Resp 18   Ht 5\' 5"  (1.651 m)   Wt (!) 161.5 kg   SpO2 98%   BMI 59.24 kg/m  Exam: General: 63 y.o pleasant lady who does not appear stated age ENTM: mucus membranes moist Cardiovascular: A.fib, HR 120's-130's, no murmurs appreciated, distal pulses present Respiratory: CTAB, tachypnea RR 22 with increased work of breathing, good cap refill Gastrointestinal: obese, soft, non tender, non distended, BS  present MSK: strength 5/5 upper extremities, strength 3/5 lower extremities with lower extremity edema extending to mid calves Psych: anxious, cooperative and answers questions appropriately  Labs and Imaging: CBC BMET  Recent Labs  Lab 06/21/19 0321  WBC 11.0*  HGB 10.1*  HCT 34.9*  PLT 617*   Recent Labs  Lab 06/21/19 0321  NA 140  K 3.8  CL 96*  CO2 36*  BUN 10  CREATININE 0.54  GLUCOSE 130*  CALCIUM 8.2*      DG Chest Portable 1 View  Result Date: 06/21/2019 CLINICAL DATA:  Shortness of breath EXAM: PORTABLE CHEST 1 VIEW COMPARISON:  June 14, 2019 FINDINGS: The heart size and mediastinal contours are within normal limits. Stable elevation of the right hemidiaphragm. Aortic knob calcifications. Both lungs are clear. The visualized skeletal structures are unremarkable. IMPRESSION: No active disease. Electronically Signed   By: Prudencio Pair M.D.   On: 06/21/2019 03:20    Carollee Leitz, MD 06/21/2019, 5:00 AM PGY-1, [facility] pager: [phone], text pages welcome

## 2019-06-21 NOTE — ED Notes (Signed)
Pt made aware that orders have been received and that pt is NPO

## 2019-06-21 NOTE — ED Notes (Signed)
Pt taken off of Bipap for lunch and po meds per MD. pts sats dropped to 88% RA, placed on 4L [place], sats 94%

## 2019-06-21 NOTE — ED Provider Notes (Signed)
TIME SEEN: 3:09 AM  CHIEF COMPLAINT: SOB, cough  HPI: Patient is a 63 year old female with history of COPD, PE on Eliquis on 6 L of oxygen at home who presents to the emergency department with EMS with complaints of shortness of breath that started tonight.  Patient reports productive cough.  No known fevers.  No chest pain or chest discomfort.  Was just admitted to the hospital 06/14/19 - 06/16/19 for respiratory failure secondary to COPD and CHF exacerbation with community-acquired pneumonia.  Discharged on doxycycline x 4 more days on 06/16/19.  EMS reports sat of 60 upon their arrival and patient was on room air.  ROS: Level 5 caveat for respiratory distress  PAST MEDICAL HISTORY/PAST SURGICAL HISTORY:  Past Medical History:  Diagnosis Date  . Abdominal wall hematoma 03/06/2019  . Acute bronchitis 08/29/2018  . Acute on chronic respiratory failure with hypoxia ([place]) 08/29/2018  . Anxiety   . Asthma   . Chronic lower back pain   . COPD (chronic obstructive pulmonary disease) ([place])   . Depression   . Exposure to COVID-19 virus 11/02/2018  . Fall 02/2019  . Family history of anesthesia complication    "daughter; causes her to pass out afterwards"  . GERD (gastroesophageal reflux disease)   . History of atrial flutter 06/26/2015  . History of pulmonary embolus (PE) 11/03/2014  . Hypertension   . Hyperthyroidism   . Left medial tibial stress syndrome 12/26/2017  . Lower GI bleed   . Migraine    "monthly" (12/28/2013)  . Non-traumatic rhabdomyolysis 11/03/2014  . Obesity hypoventilation syndrome ([place]) 03/06/2019  . Osteoarthritis    "both knees; back of my neck; right pelvic bone" (12/28/2013)  . Paroxysmal A-fib ([place])   . Pulmonary embolism ([place]) 12/28/2013   "2 clots in each lung"    MEDICATIONS:  Prior to Admission medications   Medication Sig Start Date End Date Taking? Authorizing Provider  acetaminophen (TYLENOL) 325 MG tablet Take 2 tablets (650 mg total) by mouth every 6 (six) hours as  needed for mild pain. 09/11/18   Georgette Shell, MD  albuterol (PROVENTIL) (2.5 MG/3ML) 0.083% nebulizer solution Take 3 mLs (2.5 mg total) by nebulization every 6 (six) hours as needed for wheezing or shortness of breath. Patient not taking: Reported on 06/15/2019 07/09/18   Collene Gobble, MD  apixaban (ELIQUIS) 5 MG TABS tablet Take 1 tablet (5 mg total) by mouth 2 (two) times daily. 03/08/19   Kathrene Alu, MD  azelastine (ASTELIN) 0.1 % nasal spray Place 2 sprays into both nostrils 2 (two) times daily. Use in each nostril as directed Patient taking differently: Place 2 sprays into both nostrils daily.  06/01/19   Lattie Haw, MD  azelastine (OPTIVAR) 0.05 % ophthalmic solution Place 1 drop into both eyes 2 (two) times daily as needed (itchy eyes).    [provider]  cyclobenzaprine (FLEXERIL) 10 MG tablet Take 10 mg by mouth every 8 (eight) hours as needed for muscle spasms.    [provider]  cycloSPORINE (RESTASIS) 0.05 % ophthalmic emulsion Place 1 drop into both eyes 2 (two) times daily.    [provider]  diclofenac sodium (VOLTAREN) 1 % GEL APPLY 2 GRAMS EXTERNALLY TO THE AFFECTED AREA FOUR TIMES DAILY Patient taking differently: Apply 2 g topically 4 (four) times daily as needed (arthritis pain - knees, shoulder and thighs).  07/31/18   [name], MD  diltiazem (CARDIZEM CD) 120 MG 24 hr capsule Take 1 capsule (120  mg total) by mouth daily. Patient taking differently: Take 120 mg by mouth at bedtime.  06/07/19   Patsey Berthold, NP  famotidine (PEPCID) 20 MG tablet Take 1 tablet (20 mg total) by mouth daily. 06/05/19   Anderson, Chelsey L, DO  flecainide (TAMBOCOR) 100 MG tablet TAKE 1 TABLET BY MOUTH 2 TIMES A DAY. PLEASE KEEP UPCOMING APPOINTMENT IN JANUARY BEFORE ANYMORE REFILLS. Patient taking differently: Take 100 mg by mouth 2 (two) times daily.  12/03/18   Seiler, Amber K, NP  fluticasone (FLONASE) 50 MCG/ACT nasal spray Place 2 sprays into  both nostrils daily.  10/29/18   [provider]  furosemide (LASIX) 80 MG tablet Take 1 tablet (80 mg total) by mouth daily. 06/16/19 07/16/19  Kathrene Alu, MD  guaiFENesin-dextromethorphan (ROBITUSSIN DM) 100-10 MG/5ML syrup Take 5 mLs by mouth every 4 (four) hours as needed for cough. 06/01/19   Lattie Haw, MD  hydrocortisone (ANUSOL-HC) 25 MG suppository Place 1 suppository (25 mg total) rectally 2 (two) times daily. Patient not taking: Reported on 06/15/2019 08/20/18   [place] Donnamarie Poag, DO  hydrOXYzine (VISTARIL) 25 MG capsule Take 1 capsule (25 mg total) by mouth 2 (two) times daily as needed for anxiety. 06/02/19   Rory Percy, DO  ipratropium-albuterol (DUONEB) 0.5-2.5 (3) MG/3ML SOLN Take 3 mLs by nebulization 3 (three) times daily. 06/01/19   Lattie Haw, MD  loratadine (CLARITIN) 10 MG tablet TAKE 1 TABLET(10 MG) BY MOUTH DAILY Patient taking differently: Take 10 mg by mouth daily.  03/05/19   [name], MD  metoprolol tartrate (LOPRESSOR) 25 MG tablet Take 0.5 tablets (12.5 mg total) by mouth 2 (two) times daily. 06/02/19   Rory Percy, DO  mometasone-formoterol (DULERA) 100-5 MCG/ACT AERO Inhale 2 puffs into the lungs 2 (two) times daily. 06/05/19   Anderson, Chelsey L, DO  montelukast (SINGULAIR) 10 MG tablet Take 1 tablet (10 mg total) by mouth at bedtime. 06/01/19   Lattie Haw, MD  omeprazole (PRILOSEC) 20 MG capsule Take 1 capsule (20 mg total) by mouth daily. 06/01/19   Lattie Haw, MD  OXYGEN Inhale 3 L into the lungs continuous.     [provider]  potassium chloride SA (KLOR-CON) 20 MEQ tablet Take 2 tablets (40 mEq total) by mouth 2 (two) times daily. 06/16/19 07/16/19  Kathrene Alu, MD  venlafaxine (EFFEXOR) 37.5 MG tablet Take 37.5 mg by mouth daily with breakfast. Take with a 150 mg ER capsule every morning    [provider]  venlafaxine XR (EFFEXOR-XR) 150 MG 24 hr capsule Take 1 capsule (150 mg total) by mouth daily with  breakfast. Take 150mg  capsule with 37.5mg  tablet Patient taking differently: Take 150 mg by mouth daily with breakfast. Take with a 37.5 mg tablet every morning 06/01/19   Lattie Haw, MD  VENTOLIN HFA 108 (90 Base) MCG/ACT inhaler INHALE 2 PUFFS INTO THE LUNGS EVERY 6 HOURS AS NEEDED FOR WHEEZING OR SHORTNESS OF BREATH Patient taking differently: Inhale 2 puffs into the lungs every 6 (six) hours as needed for wheezing or shortness of breath.  06/07/19   [name], MD    ALLERGIES:  Allergies  Allergen Reactions  . Caffeine Other (See Comments)    Migraine  . Hydralazine Hcl     Other reaction(s): Other (See Comments) AKI leading to rhabdo and electrolyte abnormalities  . Hydrocodone Nausea And Vomiting    Headache  . Ciprofloxacin Hives and Rash  . Erythromycin Hives and Rash  . Lisinopril  Cough  . Oxycodone Nausea And Vomiting    Headache  . Sulfamethoxazole-Trimethoprim Rash    SOCIAL HISTORY:  Social History   Tobacco Use  . Smoking status: Former Smoker    Packs/day: 0.25    Years: 20.00    Pack years: 5.00    Types: Cigarettes    Quit date: 07/17/1999    Years since quitting: 19.9  . Smokeless tobacco: Never Used  Substance Use Topics  . Alcohol use: Not Currently    Comment: "drank some in my 4's"    FAMILY HISTORY: Family History  Problem Relation Age of Onset  . Osteoarthritis Mother   . Asthma Mother   . Heart failure Mother   . Breast cancer Daughter     EXAM: BP (!) 148/89   Pulse (!) 117   Temp 98.4 F (36.9 C) (Oral)   Resp (!) 32   Ht 5\' 5"  (1.651 m)   Wt (!) 161.5 kg   SpO2 100%   BMI 59.24 kg/m  CONSTITUTIONAL: Alert and oriented and responds appropriately to questions.  Obese.  Chronically ill-appearing. HEAD: Normocephalic EYES: Conjunctivae clear, pupils appear equal, EOM appear intact ENT: normal nose; moist mucous membranes NECK: Supple, normal ROM CARD: Regular and tachycardic; S1 and S2 appreciated; no murmurs, no  clicks, no rubs, no gallops RESP: Patient is tachypneic.  Not able to speak full sentences.  She is in mild to moderate respiratory distress.  She has expiratory wheezes and very diminished aeration diffusely.  No rhonchi or rales appreciated. ABD/GI: Normal bowel sounds; non-distended; soft, non-tender, no rebound, no guarding, no peritoneal signs, no hepatosplenomegaly BACK:  The back appears normal EXT: Normal ROM in all joints; no deformity noted, no edema; no cyanosis SKIN: Normal color for age and race; warm; no rash on exposed skin NEURO: Moves all extremities equally PSYCH: The patient's mood and manner are appropriate.   MEDICAL DECISION MAKING: Patient here with respiratory distress likely from COPD exacerbation.  Differential also includes recurrent pneumonia, COVID-19, CHF, PE.  Will obtain rapid Covid antigen test given she does report shortness of breath and cough but my suspicion for Covid is low.  Patient should have finished a course of doxycycline oral be finishing it today for community-acquired pneumonia during recent admission.  She is afebrile here.  Will obtain labs, chest x-ray.  Will give albuterol inhaler, IV Solu-Medrol.  If rapid Covid antigen is negative, respiratory therapist will start BiPAP for patient.  ABG also pending.  Suspect patient will need admission.  ED PROGRESS: Rapid Covid antigen negative.  Will send 6 to 24-hour PCR Covid test.  Again low suspicion for COVID-19.  Patient placed on BiPAP.  ABG shows mild respiratory acidosis.  Chest x-ray clear.  Troponin negative.  BNP 88.   Patient improving clinically on BiPAP.  Will discuss with family medicine for admission.   4:41 AM Discussed patient's case with FM resident.  I have recommended admission and patient (and family if present) agree with this plan. Admitting physician will place admission orders.   I reviewed all nursing notes, vitals, pertinent previous records and interpreted all EKGs, lab and  urine results, imaging (as available).     CRITICAL CARE Performed by: Pryor Curia   Total critical care time: 55 minutes  Critical care time was exclusive of separately billable procedures and treating other patients.  Critical care was necessary to treat or prevent imminent or life-threatening deterioration.  Critical care was time spent personally by me on the  following activities: development of treatment plan with patient and/or surrogate as well as nursing, discussions with consultants, evaluation of patient's response to treatment, examination of patient, obtaining history from patient or surrogate, ordering and performing treatments and interventions, ordering and review of laboratory studies, ordering and review of radiographic studies, pulse oximetry and re-evaluation of patient's condition.   CHARMIAN FERRAN was evaluated in Emergency Department on 06/21/2019 for the symptoms described in the history of present illness. She was evaluated in the context of the global COVID-19 pandemic, which necessitated consideration that the patient might be at risk for infection with the SARS-CoV-2 virus that causes COVID-19. Institutional protocols and algorithms that pertain to the evaluation of patients at risk for COVID-19 are in a state of rapid change based on information released by regulatory bodies including the CDC and federal and state organizations. These policies and algorithms were followed during the patient's care in the ED.  Patient was seen wearing N95, face shield, gloves.    Massiel Stipp, Delice Bison, DO 06/21/19 [phone]

## 2019-06-21 NOTE — ED Notes (Signed)
Report given to 2W RN.

## 2019-06-21 NOTE — ED Notes (Signed)
Per admitting MD okay to remove bipap and wake pt up and give PO meds. Pt very lethargic, easily to arouse but quickly goes back to sleep

## 2019-06-21 NOTE — ED Notes (Addendum)
Heard pt's Bipap beeping; checked on pt to see that she had removed BiPap mask. Pt states she has to have something to drink; pt provided with water for comfort and RT called to re-assess pt.

## 2019-06-21 NOTE — Progress Notes (Signed)
FPTS Interim Progress Note  S:Patient admitted this AM around 5 for acute hypoxic and hypercapnic respiratory failure.  See H&P for further details.  Currently on BiPAP and reports feeling better than when she presented.  Does appear comfortable.  No specific complaints at this time.   O: BP (!) 139/96   Pulse (!) 114   Temp 98.4 F (36.9 C) (Oral)   Resp (!) 21   Ht 5\' 5"  (1.651 m)   Wt (!) 161.5 kg   SpO2 94%   BMI 59.24 kg/m    Physical Exam:  General: 63 y.o. female in NAD, in ED, on BiPAP Lungs: on BiPAP, no obvious wheezing, no increased WOB Abdomen: Soft, non-tender to palpation, non-distended Skin: warm and dry Extremities: Trace BLE edema Neuro: sleeping, but easily aroused   A/P: See full A/P from H&P this AM Will order CTA for PE given moderate risk and mildly elevated D-Dimer Obtain ABG to determine ability to hold BiPAP Patient will need outpatient BiPAP at night, given AM hypercapnia on admission Also ordered UDS Will determine if will continue COPD treatment after CTA and ABG  Meccariello, Bernita Raisin, DO 06/21/2019, 8:40 AM PGY-2, [facility] pager [phone]

## 2019-06-21 NOTE — Progress Notes (Signed)
FPTS Interim Progress Note  VBG is actually ABG.  Shows respiratory acidosis with metabolic compensation, CO2 mildly improved.  Will keep patient on BiPAP for 1-2 more hours and recheck ABG.  [place], DO 06/21/2019, 12:25 PM PGY-2, [facility] pager [phone]

## 2019-06-22 DIAGNOSIS — J9622 Acute and chronic respiratory failure with hypercapnia: Secondary | ICD-10-CM

## 2019-06-22 DIAGNOSIS — J9621 Acute and chronic respiratory failure with hypoxia: Secondary | ICD-10-CM

## 2019-06-22 LAB — BASIC METABOLIC PANEL
Anion gap: 7 (ref 5–15)
BUN: 12 mg/dL (ref 8–23)
CO2: 36 mmol/L — ABNORMAL HIGH (ref 22–32)
Calcium: 8.5 mg/dL — ABNORMAL LOW (ref 8.9–10.3)
Chloride: 96 mmol/L — ABNORMAL LOW (ref 98–111)
Creatinine, Ser: 0.61 mg/dL (ref 0.44–1.00)
GFR calc Af Amer: >60 mL/min (ref >60)
GFR calc non Af Amer: >60 mL/min (ref >60)
Glucose, Bld: 149 mg/dL — ABNORMAL HIGH (ref 70–99)
Potassium: 4.7 mmol/L (ref 3.5–5.1)
Sodium: 139 mmol/L (ref 135–145)

## 2019-06-22 LAB — CBC
HCT: 32.1 % — ABNORMAL LOW (ref 36.0–46.0)
Hemoglobin: 9.4 g/dL — ABNORMAL LOW (ref 12.0–15.0)
MCH: 26.6 pg (ref 26.0–34.0)
MCHC: 29.3 g/dL — ABNORMAL LOW (ref 30.0–36.0)
MCV: 90.7 fL (ref 80.0–100.0)
Platelets: 631 10*3/uL — ABNORMAL HIGH (ref 150–400)
RBC: 3.54 MIL/uL — ABNORMAL LOW (ref 3.87–5.11)
RDW: 18.8 % — ABNORMAL HIGH (ref 11.5–15.5)
WBC: 12 10*3/uL — ABNORMAL HIGH (ref 4.0–10.5)
nRBC: 0.4 % — ABNORMAL HIGH (ref 0.0–0.2)

## 2019-06-22 LAB — MRSA PCR SCREENING: MRSA by PCR: NEGATIVE

## 2019-06-22 MED ORDER — MELATONIN 3 MG PO TABS
6.0000 mg | ORAL_TABLET | Freq: Every evening | ORAL | Status: AC | PRN
  Administered 2019-06-22 – 2019-06-26 (×5): 6 mg via ORAL
  Filled 2019-06-22 (×6): qty 2

## 2019-06-22 MED ORDER — CYCLOBENZAPRINE HCL 10 MG PO TABS
10.0000 mg | ORAL_TABLET | Freq: Three times a day (TID) | ORAL | Status: DC | PRN
  Administered 2019-06-22 – 2019-07-05 (×12): 10 mg via ORAL
  Filled 2019-06-22 (×12): qty 1

## 2019-06-22 NOTE — Progress Notes (Addendum)
Family Medicine Teaching Service Daily Progress Note Intern Pager: [phone]  Patient name: [name] White Medical record number: [id number] Date of birth: December 18, 1955 Age: 63 y.o. Gender: female  Primary Care Provider: [name], MD Consultants: none Code Status: DNR  Pt Overview and Major Events to Date:  06/21/19 - admitted, started on BIPAP 06/22/19- [place] [facility] 5 liters, Pulmonology consulted   Assessment and Plan: 63 year old with history of vocal cord dysfunction, HFpEF, OSA not on CPAP, chronic hypoxemic respiratory failure, obesity, atrial fibrillation, and pulmonary embolism presenting with acute on chronic hypoxemic hypercarbic respiratory failure that has improved after BiPAP.   Acute Hypoxic/Hypercapneic Respiratory Failure Patient has very positive report of her experience on BiPAP, stating that it is very comfortable and she prefers it over a CPAP machine and would love to have BiPAP at home after discharge.  This morning patient has bilateral diffuse and expiratory wheezing and is saturating 100% on 5 L via nasal cannula.  Patient exhibits no signs of increased work of breathing nor retractions.  There is concern for OHS/OSA that could be contributing to patient's symptoms.  Patient reports that she has never had a sleep study to to diagnose either of these issues.  MRSA negative. WBC improved from 14 to 12.  -pulmonology consulted -pulmonology recommending home BiPAP -Continuous pulse ox -Cardiac monitoring -nightly BiPAP, tolerated well  -Tylenol PRN  -Albuterol nebulizer every 2 hours as needed -Robitussin every 4 hours for cough as needed -DuoNeb twice daily  Chest Pressure, resolved   Patient denies chest pain or pressure this AM but does report some chest tightness that she states occurs during her episodes of respiratory illnesses.  -continue to monitor with VS   Suspected OSA/OHS  -pulmonology consulted, will appreciated further recommendations   Paroxysmal  Atrial Fibrillation  Cardiac strips noted to have NSR with 1st deg heart block.  -cardiac monitoring   DM   Received 3 units of Fast acting insulin in the last 24 hours. BG ranges from 109-209 overnight.  -continue CBG checks 4 times daily -sSSI   Hx of PE  -Eliquis 5 mg twice daily  HFpEF  Daily weights 171kg today.  Output 2321mL since admission.  -Diltiazem 120 mg nightly -Flecainide 100 mg twice daily -Lasix 80 mg daily -Metoprolol 12.5 twice daily  HTN  BP 104-131/68-106 overnight, most recently 104/72. -Patient on metoprolol 12.5 twice daily -monitor with vitals   MDD/ Anxiety  -Atarax 25 mg twice daily as needed -Effexor 150mg  daily   GERD   -Pepcid 20 mg daily -Protonix 40  FEN/GI: Heart healthy PPx: Patient on Eliquis  Disposition: discharge pending improved respiratory status   Subjective:  Patient reports very positive experience with BiPAP during this admission and adds that she would like to have bipap at home as she prefers it over the CPAP machine. Patient reports feeling "much better" this AM. She continues to report minimal chest tightness but adds that this occurs with these episodes and that this tightness is usually improved after a breathing treatment.   Objective: Temp:  [97.7 F (36.5 C)-98.5 F (36.9 C)] 98.2 F (36.8 C) (12/26 0743) Pulse Rate:  [85-110] 102 (12/26 0932) Resp:  [11-38] 16 (12/26 0743) BP: (104-131)/(68-106) 104/72 (12/26 0932) SpO2:  [92 %-100 %] 100 % (12/26 0743) FiO2 (%):  [100 %] 100 % (12/26 0743) Weight:  [171 kg] 171 kg (12/26 0300)  Physical Exam: General: obese female lying supine in bed with [place] in place in NAD  Cardiovascular: RRR  without murmurs appreciated nor friction rubs, bilateral radial pulses palpated  Respiratory: diffuse bilaterally decreased breath sounds, diffuse and bilateral end expiratory wheezing, no crackles,no signs of increased WOB Abdomen: NT to palpation Extremities: non-pitting edema  noted in bilateral lower extremities, sock imprints on bilateral ankles   Laboratory: Recent Labs  Lab 06/16/19 0451 06/21/19 0321 06/21/19 1157 06/22/19 0244  WBC 14.5* 11.0*  --  12.0*  HGB 9.9* 10.1* 11.2* 9.4*  HCT 33.2* 34.9* 33.0* 32.1*  PLT 486* 617*  --  631*   Recent Labs  Lab 06/16/19 0451 06/21/19 0321 06/21/19 1157 06/22/19 0244  NA 141 140 138 139  K 3.4* 3.8 4.3 4.7  CL 99 96*  --  96*  CO2 32 36*  --  36*  BUN 14 10  --  12  CREATININE 0.67 0.54  --  0.61  CALCIUM 8.2* 8.2*  --  8.5*  GLUCOSE 173* 130*  --  149*    Imaging/Diagnostic Tests: CT ANGIO CHEST PE W OR WO CONTRAST  Result Date: 06/21/2019 CLINICAL DATA:  Shortness of breath.  Hypoxia. EXAM: CT ANGIOGRAPHY CHEST WITH CONTRAST TECHNIQUE: Multidetector CT imaging of the chest was performed using the standard protocol during bolus administration of intravenous contrast. Multiplanar CT image reconstructions and MIPs were obtained to evaluate the vascular anatomy. CONTRAST:  47mL OMNIPAQUE IOHEXOL 350 MG/ML SOLN COMPARISON:  03/14/2019 FINDINGS: Cardiovascular: Heart is enlarged. Coronary artery calcification is evident. Atherosclerotic calcification is noted in the wall of the thoracic aorta. No large central pulmonary embolus in the main pulmonary arteries or lobar pulmonary arteries. Segmental and subsegmental pulmonary arteries are obscured by respiratory motion, specially in the lower lobes. Mediastinum/Nodes: No mediastinal lymphadenopathy. There is no hilar lymphadenopathy. The esophagus has normal imaging features. There is no axillary lymphadenopathy. Lungs/Pleura: Collapse/consolidative changes noted in the lower lungs bilaterally. There is also some dependent collapse/consolidation in the upper lobes along the major fissures. No pleural effusion. Upper Abdomen: Unremarkable Musculoskeletal: No worrisome lytic or sclerotic osseous abnormality. Review of the MIP images confirms the above findings.  IMPRESSION: 1. No large central pulmonary embolus. Segmental and subsegmental pulmonary arteries not reliably evaluated due to breathing motion during image apposition. 2. Collapse/consolidative changes noted dependently in the upper and lower lobes bilaterally. This may all be related to atelectasis, but superimposed pneumonia cannot be excluded. 3.  Aortic Atherosclerois (ICD10-170.0) Electronically Signed   By: Misty Stanley M.D.   On: 06/21/2019 15:32   DG Chest Portable 1 View  Result Date: 06/21/2019 CLINICAL DATA:  Shortness of breath EXAM: PORTABLE CHEST 1 VIEW COMPARISON:  June 14, 2019 FINDINGS: The heart size and mediastinal contours are within normal limits. Stable elevation of the right hemidiaphragm. Aortic knob calcifications. Both lungs are clear. The visualized skeletal structures are unremarkable. IMPRESSION: No active disease. Electronically Signed   By: Prudencio Pair M.D.   On: 06/21/2019 03:20   DG Chest Portable 1 View  Result Date: 06/14/2019 CLINICAL DATA:  Onset shortness of breath yesterday. EXAM: PORTABLE CHEST 1 VIEW COMPARISON:  Single-view of the chest 05/28/2019. CT chest 03/14/2019. FINDINGS: Lung volumes are low and there is elevation of the right hemidiaphragm. No consolidative process, pneumothorax or effusion is identified. Heart size is mildly enlarged. Aortic atherosclerosis is noted. IMPRESSION: No acute finding in a low volume chest. Atherosclerosis. Mild cardiomegaly. Electronically Signed   By: Inge Rise M.D.   On: 06/14/2019 07:39   DG Chest Portable 1 View  Result Date: 05/28/2019 CLINICAL DATA:  Shortness of breath. COPD. Evaluate for pulmonary edema. EXAM: PORTABLE CHEST 1 VIEW COMPARISON:  One-view chest x-ray 03/17/2019 FINDINGS: The heart is enlarged. Chronic elevation of the right hemidiaphragm is again noted. Atherosclerotic changes are present at the aortic arch. Moderate pulmonary vascular congestion is present without frank edema. No focal  airspace consolidation is present. IMPRESSION: 1. Stable cardiomegaly and moderate pulmonary vascular congestion without frank edema. 2. No focal airspace consolidation. 3. Stable chronic elevation of the right hemidiaphragm. Electronically Signed   By: San Morelle M.D.   On: 05/28/2019 06:40   ECHOCARDIOGRAM COMPLETE  Result Date: 05/29/2019   ECHOCARDIOGRAM REPORT   Patient Name:   BRISTEN BATCHELLER Date of Exam: 05/29/2019 Medical Rec #:  [id number]         Height:       68.0 in Accession #:    [id number]        Weight:       360.0 lb Date of Birth:  [birth year]          BSA:          2.62 m Patient Age:    15 years          BP:           126/65 mmHg Patient Gender: F                 HR:           105 bpm. Exam Location:  Inpatient Procedure: 2D Echo, Cardiac Doppler and Color Doppler Indications:    CHF  History:        Patient has prior history of Echocardiogram examinations, most                 recent 09/10/2018. COPD, Arrythmias:Atrial Fibrillation and                 Atrial Flutter; Risk Factors:Hypertension and morbid obesity.  Sonographer:    Dustin Flock Referring Phys: [id number] CARINA M BROWN IMPRESSIONS  1. Left ventricular ejection fraction, by visual estimation, is 60 to 65%. The left ventricle has normal function. There is severely increased left ventricular hypertrophy.  2. Severe asymmetric hypertrophy measuring 62mm in the basal septum (43mm in posterior wall). No resting LVOT obstruction. If clinically indicated, would consider cardiac MRI to evaluate etiology of LVH, including HCM or amyloidosis  3. Global right ventricle has normal systolic function.The right ventricular size is mildly enlarged. No increase in right ventricular wall thickness.  4. The aortic valve is tricuspid. Aortic valve regurgitation is not visualized. No evidence of aortic valve sclerosis or stenosis.  5. The mitral valve is normal in structure. Trace mitral valve regurgitation.  6. The pulmonic valve was not  well visualized. Pulmonic valve regurgitation is not visualized.  7. The tricuspid valve is normal in structure. Tricuspid valve regurgitation is trivial.  8. Left atrial size was normal.  9. Right atrial size was normal. 10. There is mild dilatation of the ascending aorta measuring 36 mm. FINDINGS  Left Ventricle: Left ventricular ejection fraction, by visual estimation, is 60 to 65%. The left ventricle has normal function. The left ventricular internal cavity size was the left ventricle is normal in size. There is severely increased left ventricular hypertrophy. Asymmetric left ventricular hypertrophy. Left ventricular diastolic parameters were normal. Right Ventricle: The right ventricular size is mildly enlarged. No increase in right ventricular wall thickness. Global RV systolic function is has normal systolic function. The tricuspid regurgitant velocity is  2.69 m/s, and with an assumed right atrial  pressure of 8 mmHg, the estimated right ventricular systolic pressure is mildly elevated at 36.9 mmHg. Left Atrium: Left atrial size was normal in size. Right Atrium: Right atrial size was normal in size Pericardium: There is no evidence of pericardial effusion. Presence of pericardial fat pad. Mitral Valve: The mitral valve is normal in structure. Trace mitral valve regurgitation. Tricuspid Valve: The tricuspid valve is normal in structure. Tricuspid valve regurgitation is trivial. Aortic Valve: The aortic valve is tricuspid. Aortic valve regurgitation is not visualized. The aortic valve is structurally normal, with no evidence of sclerosis or stenosis. Pulmonic Valve: The pulmonic valve was not well visualized. Pulmonic valve regurgitation is not visualized. Aorta: Aortic dilatation noted. There is mild dilatation of the ascending aorta measuring 36 mm. IAS/Shunts: The interatrial septum was not well visualized.  LEFT VENTRICLE PLAX 2D LVIDd:         4.50 cm  Diastology LVIDs:         3.20 cm  LV e' lateral:    13.50 cm/s LV PW:         1.20 cm  LV E/e' lateral: 5.6 LV IVS:        1.60 cm  LV e' medial:    9.57 cm/s LVOT diam:     2.00 cm  LV E/e' medial:  7.9 LV SV:         51 ml LV SV Index:   17.77 LVOT Area:     3.14 cm  RIGHT VENTRICLE RV Basal diam:  3.70 cm RV S prime:     11.70 cm/s TAPSE (M-mode): 2.8 cm LEFT ATRIUM             Index       RIGHT ATRIUM           Index LA diam:        4.30 cm 1.64 cm/m  RA Area:     16.20 cm LA Vol (A2C):   35.6 ml 13.57 ml/m RA Volume:   42.80 ml  16.31 ml/m LA Vol (A4C):   68.8 ml 26.22 ml/m LA Biplane Vol: 53.0 ml 20.20 ml/m  AORTIC VALVE LVOT Vmax:   87.20 cm/s LVOT Vmean:  51.700 cm/s LVOT VTI:    0.129 m  AORTA Ao Root diam: 3.00 cm Ao Asc diam:  3.60 cm MITRAL VALVE                        TRICUSPID VALVE MV Area (PHT): 8.62 cm             TR Peak grad:   28.9 mmHg MV PHT:        25.52 msec           TR Vmax:        269.00 cm/s MV Decel Time: 88 msec MV E velocity: 76.00 cm/s 103 cm/s  SHUNTS MV A velocity: 89.20 cm/s 70.3 cm/s Systemic VTI:  0.13 m MV E/A ratio:  0.85       1.5       Systemic Diam: 2.00 cm  Oswaldo Milian MD Electronically signed by Oswaldo Milian MD Signature Date/Time: 05/29/2019/3:48:41 PM    Final    [name] Klein, MD 06/22/2019, 10:16 AM PGY-1, [place] Intern pager: [phone], text pages welcome

## 2019-06-22 NOTE — Progress Notes (Signed)
Chaplain visited with patient as a result of a spiritual care consult.  The patient desired to share their blessings with someone and requested a chaplain.  The chaplain provided supportive conversation and prayer.  The chaplain will follow-up if needed.  Brion Aliment Chaplain Resident For questions concerning this note please contact me by pager [phone]

## 2019-06-22 NOTE — Consult Note (Addendum)
NAME:  [name] White, MRN:  [id number], DOB:  10/05/1955, LOS: 1 ADMISSION DATE:  06/21/2019, CONSULTATION DATE: 06/22/2019 REFERRING MD: [facility], CHIEF COMPLAINT: Dyspnea  HPI/course in hospital  63 year old woman who presented with acute on chronic hypoxic and hypercapnic respiratory failure which is much improved after BiPAP.  She is well-known to our service and is followed by Dr. Lamonte Sakai for a history of vocal cord dysfunction obstructive sleep apnea on CPAP diastolic heart failure and obesity hypoventilation syndrome.  She has had several exacerbations over the last several months and has been on chronic steroids.  She was last seen in our office on 12/21 where she was given diuretics and steroids.  Similar treatment was initiated on arrival here but for respiratory failure she was started on BiPAP.  She has noticed a significant improvement and was able to sleep well for the first time in months.  Past Medical History   Past Medical History:  Diagnosis Date  . Abdominal wall hematoma 03/06/2019  . Acute bronchitis 08/29/2018  . Acute on chronic respiratory failure with hypoxia ([place]) 08/29/2018  . Anxiety   . Asthma   . Chronic lower back pain   . COPD (chronic obstructive pulmonary disease) ([place])   . Depression   . Exposure to COVID-19 virus 11/02/2018  . Fall 02/2019  . Family history of anesthesia complication    "daughter; causes her to pass out afterwards"  . GERD (gastroesophageal reflux disease)   . History of atrial flutter 06/26/2015  . History of pulmonary embolus (PE) 11/03/2014  . Hypertension   . Hyperthyroidism   . Left medial tibial stress syndrome 12/26/2017  . Lower GI bleed   . Migraine    "monthly" (12/28/2013)  . Non-traumatic rhabdomyolysis 11/03/2014  . Obesity hypoventilation syndrome ([place]) 03/06/2019  . Osteoarthritis    "both knees; back of my neck; right pelvic bone" (12/28/2013)  . Paroxysmal A-fib ([place])   . Pulmonary embolism ([place]) 12/28/2013     "2 clots in each lung"     Past Surgical History:  Procedure Laterality Date  . ABDOMINAL HYSTERECTOMY    . APPENDECTOMY    . BREAST CYST EXCISION Right   . DILATION AND CURETTAGE OF UTERUS    . ELECTROPHYSIOLOGIC STUDY N/A 05/27/2015   Procedure: A-Flutter;  Surgeon: Evans Lance, MD;  Location: [facility];  Service: Cardiovascular;  Laterality: N/A;  . EXCISIONAL HEMORRHOIDECTOMY    . NASAL SINUS SURGERY  2007  . THYROIDECTOMY, PARTIAL Right 2005  . TUBAL LIGATION    . WISDOM TOOTH EXTRACTION       Review of Systems:   Review of Systems  Constitutional: Negative.  Negative for chills and fever.  HENT: Negative.   Eyes: Negative.   Respiratory: Positive for shortness of breath. Negative for cough.   Cardiovascular: Negative.   Gastrointestinal: Negative.   Genitourinary: Negative.   Musculoskeletal: Negative.   Skin: Negative.   Neurological: Negative.   Endo/Heme/Allergies: Negative.   Psychiatric/Behavioral: Negative.     Social History   reports that she quit smoking about 19 years ago. Her smoking use included cigarettes. She has a 5.00 pack-year smoking history. She has never used smokeless tobacco. She reports previous alcohol use. She reports that she does not use drugs.   Family History   Her family history includes Asthma in her mother; Breast cancer in her daughter; Heart failure in her mother; Osteoarthritis in her mother.   Allergies Allergies  Allergen Reactions  . Caffeine  Other (See Comments)    Migraine  . Hydralazine Hcl     Other reaction(s): Other (See Comments) AKI leading to rhabdo and electrolyte abnormalities  . Hydrocodone Nausea And Vomiting    Headache  . Ciprofloxacin Hives and Rash  . Erythromycin Hives and Rash  . Lisinopril Cough  . Oxycodone Nausea And Vomiting    Headache  . Sulfamethoxazole-Trimethoprim Rash     Home Medications  Prior to Admission medications   Medication Sig Start Date End Date Taking?  Authorizing Provider  acetaminophen (TYLENOL) 325 MG tablet Take 2 tablets (650 mg total) by mouth every 6 (six) hours as needed for mild pain. 09/11/18  Yes Georgette Shell, MD  apixaban (ELIQUIS) 5 MG TABS tablet Take 1 tablet (5 mg total) by mouth 2 (two) times daily. 03/08/19  Yes Winfrey, Alcario Drought, MD  azelastine (ASTELIN) 0.1 % nasal spray Place 2 sprays into both nostrils 2 (two) times daily. Use in each nostril as directed Patient taking differently: Place 2 sprays into both nostrils daily.  06/01/19  Yes Lattie Haw, MD  azelastine (OPTIVAR) 0.05 % ophthalmic solution Place 1 drop into both eyes 2 (two) times daily as needed (itchy eyes).   Yes [provider]  cyclobenzaprine (FLEXERIL) 10 MG tablet Take 10 mg by mouth every 8 (eight) hours as needed for muscle spasms.   Yes [provider]  cycloSPORINE (RESTASIS) 0.05 % ophthalmic emulsion Place 1 drop into both eyes 2 (two) times daily.   Yes [provider]  diclofenac sodium (VOLTAREN) 1 % GEL APPLY 2 GRAMS EXTERNALLY TO THE AFFECTED AREA FOUR TIMES DAILY Patient taking differently: Apply 2 g topically 4 (four) times daily as needed (arthritis pain - knees, shoulder and thighs).  07/31/18  Yes [name], MD  diltiazem (CARDIZEM CD) 120 MG 24 hr capsule Take 1 capsule (120 mg total) by mouth daily. Patient taking differently: Take 120 mg by mouth at bedtime.  06/07/19  Yes Seiler, Amber K, NP  famotidine (PEPCID) 20 MG tablet Take 1 tablet (20 mg total) by mouth daily. 06/05/19  Yes Anderson, Chelsey L, DO  flecainide (TAMBOCOR) 100 MG tablet TAKE 1 TABLET BY MOUTH 2 TIMES A DAY. PLEASE KEEP UPCOMING APPOINTMENT IN JANUARY BEFORE ANYMORE REFILLS. Patient taking differently: Take 100 mg by mouth 2 (two) times daily.  12/03/18  Yes Seiler, Amber K, NP  fluticasone (FLONASE) 50 MCG/ACT nasal spray Place 2 sprays into both nostrils daily.  10/29/18  Yes [provider]  furosemide (LASIX) 80 MG tablet  Take 1 tablet (80 mg total) by mouth daily. 06/16/19 07/16/19 Yes Winfrey, Alcario Drought, MD  guaiFENesin-dextromethorphan (ROBITUSSIN DM) 100-10 MG/5ML syrup Take 5 mLs by mouth every 4 (four) hours as needed for cough. 06/01/19  Yes Lattie Haw, MD  hydrOXYzine (VISTARIL) 25 MG capsule Take 1 capsule (25 mg total) by mouth 2 (two) times daily as needed for anxiety. 06/02/19  Yes Rory Percy, DO  ipratropium-albuterol (DUONEB) 0.5-2.5 (3) MG/3ML SOLN Take 3 mLs by nebulization 3 (three) times daily. 06/01/19  Yes Lattie Haw, MD  loratadine (CLARITIN) 10 MG tablet TAKE 1 TABLET(10 MG) BY MOUTH DAILY Patient taking differently: Take 10 mg by mouth daily.  03/05/19  Yes [name], MD  metoprolol tartrate (LOPRESSOR) 25 MG tablet Take 0.5 tablets (12.5 mg total) by mouth 2 (two) times daily. 06/02/19  Yes Rumball, Bryson Ha, DO  mometasone-formoterol (DULERA) 100-5 MCG/ACT AERO Inhale 2 puffs into the lungs 2 (two) times daily. 06/05/19  Yes Anderson, Chelsey L, DO  montelukast (SINGULAIR) 10 MG tablet Take 1 tablet (10 mg total) by mouth at bedtime. 06/01/19  Yes Lattie Haw, MD  omeprazole (PRILOSEC) 20 MG capsule Take 1 capsule (20 mg total) by mouth daily. 06/01/19  Yes Lattie Haw, MD  potassium chloride SA (KLOR-CON) 20 MEQ tablet Take 2 tablets (40 mEq total) by mouth 2 (two) times daily. 06/16/19 07/16/19 Yes Winfrey, Alcario Drought, MD  venlafaxine (EFFEXOR) 37.5 MG tablet Take 37.5 mg by mouth daily with breakfast. Take with a 150 mg ER capsule every morning   Yes [provider]  venlafaxine XR (EFFEXOR-XR) 150 MG 24 hr capsule Take 1 capsule (150 mg total) by mouth daily with breakfast. Take 150mg  capsule with 37.5mg  tablet Patient taking differently: Take 150 mg by mouth daily with breakfast. Take with a 37.5 mg tablet every morning 06/01/19  Yes Lattie Haw, MD  VENTOLIN HFA 108 (90 Base) MCG/ACT inhaler INHALE 2 PUFFS INTO THE LUNGS EVERY 6 HOURS AS NEEDED FOR WHEEZING OR SHORTNESS OF  BREATH Patient taking differently: Inhale 2 puffs into the lungs every 6 (six) hours as needed for wheezing or shortness of breath.  06/07/19  Yes [name], MD  albuterol (PROVENTIL) (2.5 MG/3ML) 0.083% nebulizer solution Take 3 mLs (2.5 mg total) by nebulization every 6 (six) hours as needed for wheezing or shortness of breath. Patient not taking: Reported on 06/15/2019 07/09/18   Collene Gobble, MD  hydrocortisone (ANUSOL-HC) 25 MG suppository Place 1 suppository (25 mg total) rectally 2 (two) times daily. Patient not taking: Reported on 06/15/2019 08/20/18   [place] Donnamarie Poag, DO  OXYGEN Inhale 3 L into the lungs continuous.     [provider]     Interim history/subjective:  She presently denies shortness of breath.  Objective   Blood pressure 104/72, pulse (!) 102, temperature 98.2 F (36.8 C), temperature source Oral, resp. rate 16, height 5\' 5"  (1.651 m), weight (!) 171 kg, SpO2 100 %.    FiO2 (%):  [100 %] 100 %   Intake/Output Summary (Last 24 hours) at 06/22/2019 1144 Last data filed at 06/21/2019 2047 Gross per 24 hour  Intake --  Output 1400 ml  Net -1400 ml   Filed Weights   06/21/19 0308 06/22/19 0300  Weight: (!) 161.5 kg (!) 171 kg    Examination: Physical Exam Constitutional:      Appearance: Normal appearance. She is obese.  HENT:     Head: Atraumatic.     Nose: Nose normal.  Eyes:     Extraocular Movements: Extraocular movements intact.  Cardiovascular:     Rate and Rhythm: Regular rhythm.     Heart sounds: Normal heart sounds.  Pulmonary:     Effort: Pulmonary effort is normal.     Breath sounds: Normal breath sounds.  Skin:    General: Skin is warm.  Neurological:     General: No focal deficit present.     Mental Status: She is alert.      Ancillary tests (personally reviewed)  CBC: Recent Labs  Lab 06/16/19 0451 06/21/19 0313 06/21/19 0321 06/21/19 1157 06/22/19 0244  WBC 14.5*  --  11.0*  --  12.0*  NEUTROABS  --   --   7.9*  --   --   HGB 9.9* 11.2* 10.1* 11.2* 9.4*  HCT 33.2* 33.0* 34.9* 33.0* 32.1*  MCV 90.7  --  91.8  --  90.7  PLT 486*  --  617*  --  631*    Basic Metabolic Panel: Recent Labs  Lab 06/16/19 0451 06/21/19 0313 06/21/19 0321 06/21/19 1157 06/22/19 0244  NA 141 139 140 138 139  K 3.4* 3.9 3.8 4.3 4.7  CL 99  --  96*  --  96*  CO2 32  --  36*  --  36*  GLUCOSE 173*  --  130*  --  149*  BUN 14  --  10  --  12  CREATININE 0.67  --  0.54  --  0.61  CALCIUM 8.2*  --  8.2*  --  8.5*  MG 2.0  --   --   --   --    GFR: Estimated Creatinine Clearance: 115.1 mL/min (by C-G formula based on SCr of 0.61 mg/dL). Recent Labs  Lab 06/16/19 0451 06/21/19 0321 06/22/19 0244  WBC 14.5* 11.0* 12.0*    Liver Function Tests: No results for input(s): AST, ALT, ALKPHOS, BILITOT, PROT, ALBUMIN in the last 168 hours. No results for input(s): LIPASE, AMYLASE in the last 168 hours. No results for input(s): AMMONIA in the last 168 hours.  ABG    Component Value Date/Time   PHART 7.367 06/21/2019 1745   PCO2ART 65.3 (HH) 06/21/2019 1745   PO2ART 95.3 06/21/2019 1745   HCO3 36.5 (H) 06/21/2019 1745   TCO2 43 (H) 06/21/2019 1157   O2SAT 97.1 06/21/2019 1745     Coagulation Profile: No results for input(s): INR, PROTIME in the last 168 hours.  Cardiac Enzymes: No results for input(s): CKTOTAL, CKMB, CKMBINDEX, TROPONINI in the last 168 hours.  HbA1C: Hgb A1c MFr Bld  Date/Time Value Ref Range Status  06/15/2019 03:47 AM 7.8 (H) 4.8 - 5.6 % Final    Comment:    (NOTE) Pre diabetes:          5.7%-6.4% Diabetes:              >6.4% Glycemic control for   <7.0% adults with diabetes   03/19/2019 04:17 AM 6.3 (H) 4.8 - 5.6 % Final    Comment:    (NOTE)         Prediabetes: 5.7 - 6.4         Diabetes: >6.4         Glycemic control for adults with diabetes: <7.0     CBG: Recent Labs  Lab 06/15/19 2146 06/16/19 0646 06/21/19 1109 06/21/19 1222 06/21/19 1441  GLUCAP 184*  136* 227* 209* 190*     Assessment & Plan:   Severe obstructive sleep apnea and obesity hypoventilation syndrome with chronic hypercapnia. Chronic atrial fibrillation on antiarrhythmic agents and apixaban for stroke prevention Chronic diastolic heart failure which appears well compensated. Type 2 diabetes which is well controlled.  As expected, she has had significant improvement with use of BiPAP which should be her home ventilation mode. We will arrange for her to have a unit at home as well as a repeat sleep study for titration and insurance purposes. Most of her symptoms are related to obesity related lung disease and I do not believe that she has significant reactive airways.  As such repeated uses of antibiotics and steroids should be avoided as they may be contributing to her vocal cord dysfunction and will promote antibiotic resistance. Would continue her current baseline heart failure therapy.  25 min spent including >50% in counseling and coordination of care.  Kipp Brood, MD [facility] ICU Physician Branch  Pager: [phone] Mobile: [phone] After hours: [phone].  06/22/2019, 11:44  AM

## 2019-06-22 NOTE — Discharge Summary (Addendum)
[name] White Discharge Summary  Patient name: [name] White Medical record number: [id number] Date of birth: October 20, 1955 Age: 63 y.o. Gender: female Date of Admission: 06/21/2019  Date of Discharge: 07/07/19  Admitting Physician: Matilde Haymaker, MD  Primary Care Provider: [name], MD Consultants: Pulmonology  Indication for Hospitalization: acute on chronic hypoxic respiratory failure   Discharge Diagnoses/Problem List:  Acute on Chronic hypoxic respiratory failure  Nonintractable headache OSA vs OHS Fever Hypertension associated with diabetes ([place]) T2DM Vocal cord dysfunction  HFpEF Obesity  AFIB Hx of PE    Disposition: Home   Discharge Condition: stable and improved   Discharge Exam:   GEN: pleasant female lying in bed without acute distress  CV: regular rate and rhythm, no murmurs appreciated RESP: no increased work of breathing, clear to ascultation bilaterally with no crackles, wheezes, or rhonchi ABD: Bowel sounds present. Soft, obese abdomen, nontender, Nondistended.  MSK: no pitting lower extremity edema, or cyanosis noted SKIN: warm, dry, left hip wound with serosanguinous discharge, mild tenderness with palpation   Brief White Course:  [name] White is a 63 y.o. female p/w presenting with acute on chronic hypoxemic hypercarbic respiratory failure that has improved after BiPAP. PMHx significant for  history of vocal cord dysfunction, HFpEF, OSA not on CPAP, chronic hypoxemic respiratory failure, obesity, atrial fibrillation, and pulmonary embolism  Acute on Chronic Hypoxic Respiratory Failure  Patient was admitted with SOB and increased O2 requirment with increased WOB. Patient reports that she has never undergone a sleep study. OSA/OHS has been diagnosed previously, however. Patient appears to follow with Geraldo Pitter, NP at [facility]. Patient denies CPAP usage at home. During this admission she used BiPaP  continuously until being transitioned to night time BiPap only. Pulmonary medicine was consulted and recommended avoiding repeated abx and steroid courses in this patient. Pulm recommends use of BiPap at home and plans to repeat sleep study for machine titrations/setting. They noted that there was no evidence of infectious process or COPD causing patient's symptoms.  It is thought that patient's numerous admissions and underlying lung problems are secondary to OHS/OSA and no home treatment for this.  She improved during her hospitalization with use of BiPAP QHS.  At the time of discharge she was on 4-5L per [place] and breathing comfortably with stable vitals. Patient has outpatient sleep study kit at home to complete. Patient must complete home sleep study and send in the results. Additionally, patient was discharged with home Trilogy ventilator  as she failed 4L [place] bedtime trial. Patient to use Trilogy at bedtime. Patient needs pulmonology outpatient follow up in 1 week.     DM Patient A1c on 12/19 was 7.8.  This is significantly increased from values in the 5.9-6.3 range previously.  It is suspected that given her numerous hospitalizations and treatments for "COPD" with steroids, this is likely the cause of her increased A1c. Patient required sliding scale insulin at the start of her admission.  Blood sugars normalized and short acting insulin and glucose monitoring was discontinued for about a week prior to discharge.   Fever During admission patient did spike a fever to 101.2 F on 06/26/19.  She was somewhat tachycardic at that point and EKG showed a sinus tachycardia.  Repeat Covid as well as flu swab were negative.  Fever episode was an isolated event.   Hip Wound (Abscess vs Hematoma)  Patient had left hip wound with spontaneous drainage.  Wound care was consulted and bacitracin was applied.  Wound continued to have purulent discharge and general surgery was consulted who did not recommend I&D.  Patient  was treated with doxycycline to complete 7-day course outpatient (1/9-1/15).   Headache  Patient reported intermittent headaches throughout her admission that were managed with scheduled Tylenol and as needed tramadol.  Patient with history of migraines.  She was started on migraine prophylaxis, Topamax.   Issues for Follow Up:  Avoid repeat antibiotics and steroid courses for recurrent respiratory related treatments. Patient needs [place] pulmonology follow-up in 1 week.  If not already scheduled, please coordinate this care. Remind patient to complete sleep study and send in her results. Patient was provided contact for this on discharge. Patient needs ENT and SLP follow up for vocal cord dysfunction. Follow-up patient's A1c, last A1c 06/15/19 was 7.8.  Suspected that this was elevated due to numerous recent steroid treatments.  She was not discharged home on any diabetes medicines. Ensure patient completes full course of antibiotics   Significant Procedures: None  Significant Labs and Imaging:  Recent Labs  Lab 07/01/19 0445 07/06/19 1502  WBC 8.6 10.0  HGB 9.6* 9.9*  HCT 33.2* 33.8*  PLT 471* 553*   Recent Labs  Lab 07/01/19 0445 07/06/19 1502  NA 138 139  K 4.2 3.9  CL 95* 95*  CO2 37* 36*  GLUCOSE 147* 128*  BUN 6* 6*  CREATININE 0.67 0.61  CALCIUM 8.8* 8.7*     CT ANGIO CHEST PE W OR WO CONTRAST  Result Date: 06/21/2019 CLINICAL DATA:  Shortness of breath.  Hypoxia. EXAM: CT ANGIOGRAPHY CHEST WITH CONTRAST TECHNIQUE: Multidetector CT imaging of the chest was performed using the standard protocol during bolus administration of intravenous contrast. Multiplanar CT image reconstructions and MIPs were obtained to evaluate the vascular anatomy. CONTRAST:  77m OMNIPAQUE IOHEXOL 350 MG/ML SOLN COMPARISON:  03/14/2019 FINDINGS: Cardiovascular: Heart is enlarged. Coronary artery calcification is evident. Atherosclerotic calcification is noted in the wall of the thoracic  aorta. No large central pulmonary embolus in the main pulmonary arteries or lobar pulmonary arteries. Segmental and subsegmental pulmonary arteries are obscured by respiratory motion, specially in the lower lobes. Mediastinum/Nodes: No mediastinal lymphadenopathy. There is no hilar lymphadenopathy. The esophagus has normal imaging features. There is no axillary lymphadenopathy. Lungs/Pleura: Collapse/consolidative changes noted in the lower lungs bilaterally. There is also some dependent collapse/consolidation in the upper lobes along the major fissures. No pleural effusion. Upper Abdomen: Unremarkable Musculoskeletal: No worrisome lytic or sclerotic osseous abnormality. Review of the MIP images confirms the above findings. IMPRESSION: 1. No large central pulmonary embolus. Segmental and subsegmental pulmonary arteries not reliably evaluated due to breathing motion during image apposition. 2. Collapse/consolidative changes noted dependently in the upper and lower lobes bilaterally. This may all be related to atelectasis, but superimposed pneumonia cannot be excluded. 3.  Aortic Atherosclerois (ICD10-170.0) Electronically Signed   By: EMisty StanleyM.D.   On: 06/21/2019 15:32   DG Chest Portable 1 View  Result Date: 06/21/2019 CLINICAL DATA:  Shortness of breath EXAM: PORTABLE CHEST 1 VIEW COMPARISON:  June 14, 2019 FINDINGS: The heart size and mediastinal contours are within normal limits. Stable elevation of the right hemidiaphragm. Aortic knob calcifications. Both lungs are clear. The visualized skeletal structures are unremarkable. IMPRESSION: No active disease. Electronically Signed   By: BPrudencio PairM.D.   On: 06/21/2019 03:20   DG Chest Portable 1 View  Result Date: 06/14/2019 CLINICAL DATA:  Onset shortness of breath yesterday. EXAM: PORTABLE CHEST 1 VIEW COMPARISON:  Single-view of the chest 05/28/2019. CT chest 03/14/2019. FINDINGS: Lung volumes are low and there is elevation of the right  hemidiaphragm. No consolidative process, pneumothorax or effusion is identified. Heart size is mildly enlarged. Aortic atherosclerosis is noted. IMPRESSION: No acute finding in a low volume chest. Atherosclerosis. Mild cardiomegaly. Electronically Signed   By: Inge Rise M.D.   On: 06/14/2019 07:39     Results/Tests Pending at Time of Discharge:  Unresulted Labs (From admission, onward)     Start     Ordered   07/08/19 6579  Basic metabolic panel  Once,   R    Question:  Specimen collection method  Answer:  Lab=Lab collect   07/04/19 1135   07/08/19 0500  CBC  Once,   R    Question:  Specimen collection method  Answer:  Lab=Lab collect   07/04/19 1135             Discharge Medications:  Allergies as of 07/07/2019       Reactions   Caffeine Other (See Comments)   Migraine   Hydralazine Hcl    Other reaction(s): Other (See Comments) AKI leading to rhabdo and electrolyte abnormalities   Hydrocodone Nausea And Vomiting   Headache   Ciprofloxacin Hives, Rash   Erythromycin Hives, Rash   Lisinopril Cough   Oxycodone Nausea And Vomiting   Headache   Sulfamethoxazole-trimethoprim Rash        Medication List     TAKE these medications    acetaminophen 325 MG tablet Commonly known as: TYLENOL Take 2 tablets (650 mg total) by mouth every 6 (six) hours as needed for mild pain.   albuterol (2.5 MG/3ML) 0.083% nebulizer solution Commonly known as: PROVENTIL Take 3 mLs (2.5 mg total) by nebulization every 6 (six) hours as needed for wheezing or shortness of breath. What changed: Another medication with the same name was changed. Make sure you understand how and when to take each.   Ventolin HFA 108 (90 Base) MCG/ACT inhaler Generic drug: albuterol INHALE 2 PUFFS INTO THE LUNGS EVERY 6 HOURS AS NEEDED FOR WHEEZING OR SHORTNESS OF BREATH What changed: See the new instructions.   apixaban 5 MG Tabs tablet Commonly known as: ELIQUIS Take 1 tablet (5 mg total) by  mouth 2 (two) times daily.   azelastine 0.05 % ophthalmic solution Commonly known as: OPTIVAR Place 1 drop into both eyes 2 (two) times daily as needed (itchy eyes).   azelastine 0.1 % nasal spray Commonly known as: ASTELIN Place 2 sprays into both nostrils 2 (two) times daily. Use in each nostril as directed What changed:  when to take this additional instructions   bacitracin ointment Apply topically 2 (two) times daily.   cyclobenzaprine 10 MG tablet Commonly known as: FLEXERIL Take 10 mg by mouth every 8 (eight) hours as needed for muscle spasms.   cycloSPORINE 0.05 % ophthalmic emulsion Commonly known as: RESTASIS Place 1 drop into both eyes 2 (two) times daily.   diclofenac sodium 1 % Gel Commonly known as: VOLTAREN APPLY 2 GRAMS EXTERNALLY TO THE AFFECTED AREA FOUR TIMES DAILY What changed: See the new instructions.   diltiazem 120 MG 24 hr capsule Commonly known as: CARDIZEM CD Take 1 capsule (120 mg total) by mouth daily. What changed: when to take this   doxycycline 100 MG tablet Commonly known as: VIBRA-TABS Take 1 tablet (100 mg total) by mouth every 12 (twelve) hours for 6 days.   famotidine 20 MG tablet Commonly known as:  PEPCID Take 1 tablet (20 mg total) by mouth daily.   flecainide 100 MG tablet Commonly known as: TAMBOCOR TAKE 1 TABLET BY MOUTH 2 TIMES A DAY. PLEASE KEEP UPCOMING APPOINTMENT IN JANUARY BEFORE ANYMORE REFILLS. What changed: See the new instructions.   fluticasone 50 MCG/ACT nasal spray Commonly known as: FLONASE Place 2 sprays into both nostrils daily.   furosemide 80 MG tablet Commonly known as: LASIX Take 1 tablet (80 mg total) by mouth daily.   guaiFENesin-dextromethorphan 100-10 MG/5ML syrup Commonly known as: ROBITUSSIN DM Take 5 mLs by mouth every 4 (four) hours as needed for cough.   hydrOXYzine 25 MG capsule Commonly known as: VISTARIL Take 1 capsule (25 mg total) by mouth 2 (two) times daily as needed for  anxiety.   ipratropium-albuterol 0.5-2.5 (3) MG/3ML Soln Commonly known as: DUONEB Take 3 mLs by nebulization every 12 (twelve) hours as needed (shortness of breath, wheezing). What changed:  when to take this reasons to take this   loratadine 10 MG tablet Commonly known as: CLARITIN TAKE 1 TABLET(10 MG) BY MOUTH DAILY What changed: See the new instructions.   Melatonin 3 MG Tabs Take 1 tablet (3 mg total) by mouth at bedtime as needed (sleep).   metoprolol tartrate 25 MG tablet Commonly known as: LOPRESSOR Take 0.5 tablets (12.5 mg total) by mouth 2 (two) times daily.   mometasone-formoterol 100-5 MCG/ACT Aero Commonly known as: DULERA Inhale 2 puffs into the lungs 2 (two) times daily.   montelukast 10 MG tablet Commonly known as: SINGULAIR Take 1 tablet (10 mg total) by mouth at bedtime.   omeprazole 20 MG capsule Commonly known as: PRILOSEC Take 1 capsule (20 mg total) by mouth daily.   OXYGEN Inhale 3 L into the lungs continuous.   potassium chloride SA 20 MEQ tablet Commonly known as: KLOR-CON Take 2 tablets (40 mEq total) by mouth 2 (two) times daily.   topiramate 25 MG tablet Commonly known as: TOPAMAX Take 1 tablet (25 mg total) by mouth daily. Start taking on: July 08, 2019   traMADol 50 MG tablet Commonly known as: ULTRAM Take 1 tablet (50 mg total) by mouth every 12 (twelve) hours as needed (breakthrough pain. Must have tried Tylenol first.).   umeclidinium bromide 62.5 MCG/INH Aepb Commonly known as: INCRUSE ELLIPTA Inhale 1 puff into the lungs daily. Start taking on: July 08, 2019   venlafaxine 37.5 MG tablet Commonly known as: EFFEXOR Take 37.5 mg by mouth daily with breakfast. Take with a 150 mg ER capsule every morning What changed: Another medication with the same name was changed. Make sure you understand how and when to take each.   venlafaxine XR 150 MG 24 hr capsule Commonly known as: EFFEXOR-XR Take 1 capsule (150 mg total) by  mouth daily with breakfast. Take 133m capsule with 37.563mtablet What changed: additional instructions               Durable Medical Equipment  (From admission, onward)           Start     Ordered   07/06/19 1518  For home use only DME Bipap  Once    Comments: Trilogy  Question:  Length of Need  Answer:  Lifetime   07/06/19 1517            Discharge Instructions: Please refer to Patient Instructions section of EMR for full details.  Patient was counseled important signs and symptoms that should prompt return to medical care, changes in  medications, dietary instructions, activity restrictions, and follow up appointments.   Follow-Up Appointments: Follow-up Information     [facility] Follow up.   Why: Home health services arranged PT,OT        [name], MD. Go on 07/12/2019.   Specialty: Family Medicine Why: at 2:30 PM  Contact information: [address] [place] [address] [facility]. Schedule an appointment as soon as possible for a visit in 2 week(s).   Specialty: Pulmonology Why: Follow up Contact information: [address] [place] [address] [place], DO PGY-1, [facility] 07/07/2019 3:19 PM    Marny Lowenstein, MD, [place] - PGY3 07/07/2019 3:53 PM

## 2019-06-22 NOTE — Progress Notes (Signed)
Patient is on NIV at this time, settings adjusted per patient comfort tolerating it well

## 2019-06-22 NOTE — Progress Notes (Signed)
PT Cancellation Note  Patient Details Name: [name] White MRN: [id number] DOB: [birth year]   Cancelled Treatment:    Reason Eval/Treat Not Completed: Other (comment)(Receiving breathing treatment then wants to eat lunch)  Will attempt to come back later today if time allows and pt available.  If not, will f/u with pt tomorrow.    Ophelia Shoulder, PT   Acute Rehabilitation Services  Pager [phone] Office [phone] 06/22/2019  [name] 06/22/2019, 1:05 PM

## 2019-06-22 NOTE — Progress Notes (Signed)
Received text page from RN that patient would like something to help her sleep.   Melatonin 6mg  was ordered for the patient to have qHS PRN. Also spoke with RN Mirian Mo about placing patient on BiPAP at night due to concern for OSA/OHS. RN reported that patient was not currently on BiPap, was satting in mid 90s on 4L [place], but that she would call respiratory to have them set up BiPAP.   Milus Banister, [place], PGY-2 06/22/2019 12:21 AM

## 2019-06-22 NOTE — Progress Notes (Addendum)
Family Medicine Teaching Service Daily Progress Note Intern Pager: [phone]  Patient name: SOMONE White Medical record number: [id number] Date of birth: 10/15/1955 Age: 62 y.o. Gender: female  Primary Care Provider: [name], MD Consultants: none Code Status: DNR  Pt Overview and Major Events to Date:  06/21/19 - admitted, started on BIPAP 06/22/19- [place] 5 liters, Pulmonology consulted   Assessment and Plan: 63 year old with history of vocal cord dysfunction, HFpEF, OSA not on CPAP, chronic hypoxemic respiratory failure, obesity, atrial fibrillation, and pulmonary embolism presenting with acute on chronic hypoxemic hypercarbic respiratory failure that has improved after BiPAP.   Acute on chronic Hypoxic/Hypercapneic Respiratory Failure, improving Most likely secondary to poorly managed chronic problems.  Significant improvement with BiPAP overnight.  She reports this morning that, on BiPAP, she has slept longer than she has in years.  There is concern for OHS/OSA that could be contributing to patient's symptoms.  Assessed by pulmonology on 12/26, home BiPAP recommended, no concern for infectious etiology or COPD exacerbation at present.  Awaiting acquisition of home BiPAP.  Awaiting PT/OT evaluation. -Continuous pulse ox -nightly BiPAP, tolerated well  -Tylenol PRN  -Albuterol nebulizer every 2 hours as needed -Robitussin every 4 hours for cough as needed -DuoNeb twice daily  Chest Pressure, resolved   -Monitor for symptoms  Suspected OSA/OHS  -Home BiPAP ordered  Paroxysmal Atrial Fibrillation  Well-controlled.  No work-up at this time. -Continue dilt  DM    -continue CBG checks 4 times daily -sSSI   Hx of PE  -Eliquis 5 mg twice daily  HFpEF  Daily weights 171kg today.  Output 2356mL since admission.  -Diltiazem 120 mg nightly -Flecainide 100 mg twice daily -Lasix 80 mg daily -Metoprolol 12.5 twice daily  HTN  BP 104-131/68-106 overnight, most recently  104/72. -Patient on metoprolol 12.5 twice daily -monitor with vitals   MDD/ Anxiety  -Atarax 25 mg twice daily as needed -Effexor 150mg  daily   GERD   -Pepcid 20 mg daily -Protonix 40  FEN/GI: Heart healthy PPx: Eliquis  Disposition: Awaiting evaluation by PT/OT.  Subjective:  No acute events overnight.  BiPAP used overnight.  Significant subjective improvement in work of breathing.  She also notices a decrease in her reported chest tightness.  We spoke at length about this hospitalization not being related to COPD exacerbation and more likely related to poorly controlled chronic diseases.  Objective: Temp:  [97.7 F (36.5 C)-98.6 F (37 C)] 98.5 F (36.9 C) (12/26 2011) Pulse Rate:  [82-102] 94 (12/26 2011) Resp:  [16-26] 16 (12/26 2011) BP: (91-122)/(57-80) 102/80 (12/26 2011) SpO2:  [96 %-100 %] 98 % (12/26 2011) FiO2 (%):  [100 %] 100 % (12/26 1300) Weight:  [171 kg] 171 kg (12/26 0300)  Physical Exam: General: Alert and cooperative and appears to be in no acute distress.   HEENT: Neck non-tender without lymphadenopathy, masses or thyromegaly Cardio: Distant heart sounds. Pulm: Breathing comfortably on 5 L nasal cannula.  Good air movement in the lung fields bilaterally.  Transmitted upper airway sounds noted.  No wheezing appreciated on auscultation.  Audible respiratory sounds most likely related to vocal cord dysfunction. Abdomen: Bowel sounds normal. Abdomen soft and non-tender.  Extremities: 2+ pitting edema to the mid tibia.  Warm/ well perfused.  Strong radial pulse. Neuro: Cranial nerves grossly intact   Laboratory: Recent Labs  Lab 06/16/19 0451 06/21/19 0321 06/21/19 1157 06/22/19 0244  WBC 14.5* 11.0*  --  12.0*  HGB 9.9* 10.1* 11.2* 9.4*  HCT  33.2* 34.9* 33.0* 32.1*  PLT 486* 617*  --  631*   Recent Labs  Lab 06/16/19 0451 06/21/19 0321 06/21/19 1157 06/22/19 0244  NA 141 140 138 139  K 3.4* 3.8 4.3 4.7  CL 99 96*  --  96*  CO2 32 36*  --   36*  BUN 14 10  --  12  CREATININE 0.67 0.54  --  0.61  CALCIUM 8.2* 8.2*  --  8.5*  GLUCOSE 173* 130*  --  149*    Imaging/Diagnostic Tests: No results found.  Matilde Haymaker, MD 06/22/2019, 10:32 PM PGY-2, [employer] Intern pager: [phone], text pages welcome

## 2019-06-22 NOTE — Progress Notes (Signed)
SLP Cancellation Note  Patient Details Name: [name] White MRN: [id number] DOB: 12/05/1955   Cancelled treatment:       Reason Eval/Treat Not Completed: Other (comment). Order to evaluate vocal fold dysfunction received. Given acute status and typical need for ENT eval prior to SLP therapy for voice therapy, recommend this evaluation be completed as an Outpatient. Patient has been worked up to our limited capacity in this setting in prior admissions. If patient needs evaluation for swallowing please reorder.    Jenesys Casseus, Katherene Ponto 06/22/2019, 7:55 AM

## 2019-06-23 LAB — GLUCOSE, CAPILLARY
Glucose-Capillary: 160 mg/dL — ABNORMAL HIGH (ref 70–99)
Glucose-Capillary: 115 mg/dL — ABNORMAL HIGH (ref 70–99)

## 2019-06-23 LAB — CBC
HCT: 33.9 % — ABNORMAL LOW (ref 36.0–46.0)
Hemoglobin: 9.7 g/dL — ABNORMAL LOW (ref 12.0–15.0)
MCH: 26.4 pg (ref 26.0–34.0)
MCHC: 28.6 g/dL — ABNORMAL LOW (ref 30.0–36.0)
MCV: 92.4 fL (ref 80.0–100.0)
Platelets: 655 10*3/uL — ABNORMAL HIGH (ref 150–400)
RBC: 3.67 MIL/uL — ABNORMAL LOW (ref 3.87–5.11)
RDW: 19.1 % — ABNORMAL HIGH (ref 11.5–15.5)
WBC: 9.3 10*3/uL (ref 4.0–10.5)
nRBC: 0.5 % — ABNORMAL HIGH (ref 0.0–0.2)

## 2019-06-23 LAB — URINE CULTURE: Culture: 20000 — AB

## 2019-06-23 NOTE — Progress Notes (Signed)
OT Cancellation Note  Patient Details Name: [name] White MRN: [id number] DOB: [birth year]   Cancelled Treatment:    Reason Eval/Treat Not Completed: Medical issues which prohibited therapy. RN in room, pt with increased WOB and unable to tolerate activity at this time. Will follow.  Malka So 06/23/2019, 3:20 PM  Nestor Lewandowsky, OTR/L Acute Rehabilitation Services Pager: [phone] Office: [phone]

## 2019-06-23 NOTE — Evaluation (Signed)
Physical Therapy Evaluation Patient Details Name: [name] White MRN: [id number] DOB: 1956/06/10 Today's Date: 06/23/2019   History of Present Illness  63 y.o. female admitted on 06/21/19 for SOB.  Dx with acute on chronic hypoxic hypercapneic respiratory failure likely multifactorial due to chronic problems, suspected OHS/OSA.  Pt with significant PMH of PE, PAF, OA, migraine, HTN, fall, COPD, chronic low back pain, acute on chronic respiratory failure wiht hypoxia.    Clinical Impression  Pt is generally weak and deconditioned, but fairly sedentary at home at baseline (transfers from couch to [facility] and back).  She maintained stable vitals on 6L O2 [place] during gait (returned to 5L at rest as O2 tank does not have 5L as an option).  BP and HR both stable despite DOE 3/4 and reports of lightheadedness during gait.  Pt remains appropriate for post acute [facility] services that were in place with [facility] PTA.   PT to follow acutely for deficits listed below.      Follow Up Recommendations Home health PT;Supervision - Intermittent;Other (comment)(resume HHPT/HHOT- [facility])    Equipment Recommendations  Hospital bed;Other (comment)([facility] is working on getting new hospital bed, hers is broken)    Recommendations for Other Services   NA    Precautions / Restrictions Precautions Precautions: Fall Precaution Comments: watch O2 sat      Mobility  Bed Mobility Overal bed mobility: Modified Independent Bed Mobility: Supine to Sit;Sit to Supine     Supine to sit: Supervision;HOB elevated Sit to supine: Supervision;HOB elevated   General bed mobility comments: Used railing for support during transitions.   Transfers Overall transfer level: Needs assistance Equipment used: Rolling walker (2 wheeled) Transfers: Sit to/from Stand Sit to Stand: Min guard         General transfer comment: Min guard assist for safety, line management, cues to stand for an extra few seconds to make sure she  feels ok before proceeding to walk.   Ambulation/Gait Ambulation/Gait assistance: Min guard Gait Distance (Feet): 15 Feet Assistive device: Rolling walker (2 wheeled) Gait Pattern/deviations: Step-through pattern;Shuffle;Trunk flexed Gait velocity: reduced Gait velocity interpretation: <1.31 ft/sec, indicative of household ambulator General Gait Details: Pt with slow, shuffling gait, leaned over RW (not wide enough for her).  Pt with increased DOE 3/4 despite O2 sats 94% on 5L O2 [place], HR 119 max observed and BPs stable despite reports of lightheadedness.           Balance Overall balance assessment: Needs assistance Sitting-balance support: Feet supported;No upper extremity supported Sitting balance-Leahy Scale: Fair     Standing balance support: Bilateral upper extremity supported Standing balance-Leahy Scale: Poor Standing balance comment: needs external support from RW.                              Pertinent Vitals/Pain Pain Assessment: Faces Faces Pain Scale: Hurts even more Pain Location: headache Pain Descriptors / Indicators: Aching Pain Intervention(s): Limited activity within patient's tolerance;Monitored during session;Repositioned;Premedicated before session    Home Living Family/patient expects to be discharged to:: Private residence Living Arrangements: Other relatives(niece) Available Help at Discharge: Family;Available PRN/intermittently;Other (Comment)(HH aide 2 days/wk) Type of Home: Apartment Home Access: Stairs to enter Entrance Stairs-Rails: None Entrance Stairs-Number of Steps: 1 Home Layout: One level Home Equipment: Walker - 2 wheels;Cane - single point;Walker - 4 wheels;Hospital bed;Other (comment)(home O2, broken hospital bed) Additional Comments: lives with niece    Prior Function Level of Independence: Independent with  assistive device(s);Needs assistance   Gait / Transfers Assistance Needed: Per pt goes from couch to [facility] and back  (only a couple of feet) her hospital bed is broken and she is waiting on a new one, she wears 3L O2 [place] at baseline, but increased to 5L "as I was getting sick" Active HHPT and OT through [facility], but PT has only been out, no OT yet.               Extremity/Trunk Assessment   Upper Extremity Assessment Upper Extremity Assessment: Defer to OT evaluation    Lower Extremity Assessment Lower Extremity Assessment: Generalized weakness    Cervical / Trunk Assessment Cervical / Trunk Assessment: Normal  Communication   Communication: No difficulties  Cognition Arousal/Alertness: Awake/alert Behavior During Therapy: WFL for tasks assessed/performed Overall Cognitive Status: Within Functional Limits for tasks assessed                                               Assessment/Plan    PT Assessment Patient needs continued PT services  PT Problem List Decreased strength;Decreased activity tolerance;Decreased balance;Decreased mobility;Decreased knowledge of precautions;Cardiopulmonary status limiting activity;Decreased knowledge of use of DME;Obesity       PT Treatment Interventions DME instruction;Gait training;Functional mobility training;Therapeutic activities;Therapeutic exercise;Balance training;Patient/family education    PT Goals (Current goals can be found in the Care Plan section)  Acute Rehab PT Goals Patient Stated Goal: to not come back to the hospital so frequently (has been 3 times this month) PT Goal Formulation: With patient Time For Goal Achievement: 07/07/19 Potential to Achieve Goals: Good    Frequency Min 3X/week           AM-PAC PT "6 Clicks" Mobility  Outcome Measure Help needed turning from your back to your side while in a flat bed without using bedrails?: A Little Help needed moving from lying on your back to sitting on the side of a flat bed without using bedrails?: A Little Help needed moving to and from a bed to a chair (including a  wheelchair)?: A Little Help needed standing up from a chair using your arms (e.g., wheelchair or bedside chair)?: A Little Help needed to walk in hospital room?: A Little Help needed climbing 3-5 steps with a railing? : A Little 6 Click Score: 18    End of Session Equipment Utilized During Treatment: Oxygen(5L O2 [place]) Activity Tolerance: Patient limited by fatigue Patient left: in bed;with call bell/phone within reach   PT Visit Diagnosis: Muscle weakness (generalized) (M62.81);Difficulty in walking, not elsewhere classified (R26.2)    Time: [id number] PT Time Calculation (min) (ACUTE ONLY): 22 min   Charges:        Verdene Lennert, PT, DPT  Acute Rehabilitation [phone] pager #[phone] office  @ [place]: [phone]   PT Evaluation $PT Eval Moderate Complexity: 1 Mod         06/23/2019, 12:03 PM

## 2019-06-23 NOTE — Progress Notes (Signed)
Walked into patient's room and she was on 5L [place], so I went and got a humidification bottle and placed on [place] for patient

## 2019-06-23 NOTE — Progress Notes (Signed)
Dr. Posey Pronto of family medicine made aware of pt spike in temp 100.2 (no other spikes noted), hr-tachy, h/a. Per Dr. Sinclair Grooms to monitor and notify of spike in temp before administration of tylenol.

## 2019-06-24 DIAGNOSIS — J9612 Chronic respiratory failure with hypercapnia: Secondary | ICD-10-CM

## 2019-06-24 DIAGNOSIS — G4733 Obstructive sleep apnea (adult) (pediatric): Secondary | ICD-10-CM

## 2019-06-24 DIAGNOSIS — J9611 Chronic respiratory failure with hypoxia: Secondary | ICD-10-CM

## 2019-06-24 LAB — GLUCOSE, CAPILLARY
Glucose-Capillary: 199 mg/dL — ABNORMAL HIGH (ref 70–99)
Glucose-Capillary: 118 mg/dL — ABNORMAL HIGH (ref 70–99)
Glucose-Capillary: 122 mg/dL — ABNORMAL HIGH (ref 70–99)
Glucose-Capillary: 140 mg/dL — ABNORMAL HIGH (ref 70–99)

## 2019-06-24 MED ORDER — BACITRACIN ZINC 500 UNIT/GM EX OINT
TOPICAL_OINTMENT | Freq: Two times a day (BID) | CUTANEOUS | Status: DC
  Filled 2019-06-24 (×2): qty 28.4

## 2019-06-24 NOTE — Progress Notes (Signed)
RT checked water chamber on CPAP.  RN at bedside sated she will place patient on CPAP with 5L O2 bleed in after patient get's night meds.  Vitals stable.

## 2019-06-24 NOTE — Progress Notes (Addendum)
Family Medicine Teaching Service Daily Progress Note Intern Pager: [phone]  Patient name: [name] White Medical record number: [id number] Date of birth: 1955/10/17 Age: 63 y.o. Gender: female  Primary Care Provider: [name], MD Consultants: none Code Status: DNR  Pt Overview and Major Events to Date:  06/21/19 - admitted, started on BIPAP 06/22/19-  5 liters, Pulmonology consulted   Assessment and Plan: 63 year old with history of vocal cord dysfunction, HFpEF, OSA not on CPAP, chronic hypoxemic respiratory failure, obesity, atrial fibrillation, and pulmonary embolism presenting with acute on chronic hypoxemic hypercarbic respiratory failure that has improved after BiPAP.   Acute on chronic Hypoxic/Hypercapneic Respiratory Failure, improving On home 3-4 L at home.  Pulmonology consultation on 12/26, recommended home BiPAP.  No other concern for infectious etiology or COPD exacerbation at present.  Suspect that OHS/OSA is contributing to the patient's symptoms.  Pulmonology arranging for home BiPAP and repeat sleep study for titration and insurance purposes. -Continuous pulse ox -nightly BiPAP, tolerated well  -Tylenol PRN  -Albuterol nebulizer every 2 hours as needed -Robitussin every 4 hours for cough as needed -DuoNeb twice daily -Pulmonology to help require home BiPAP for patient -Home health PT/OT, orders in  Small draining Abscess on Left Hip Patient reports "bleeding spot" on her left hip that she noticed this AM.  Nurse was bringing in bandage.  About 1.5 cm in size, non-fluctuant drains small amount of blood with pressure, see picture below.  No surrounding erythema.  Will treat with bacitracin ointment and overlying dressing. - bacitracin - wound care per nursing  Chest Pressure, resolved   -Monitor for symptoms  Suspected OSA/OHS  -Home BiPAP ordered  Paroxysmal Atrial Fibrillation  On Eliquis, also has history of PE.  Rate is well controlled, well on  the same. -Continue dilt  DM   CBG this a.m. 199.  No home diabetes medications on list.  Last A1c 7.8 on 12/19.  Suspect that this is likely secondary to numerous steroid dosing for possible COPD exacerbations.  Hopefully now that it is known she does not have COPD, no further steroids will be needed, and she will not have increased CBGs. -continue CBG checks 4 times daily -sSSI   Hx of PE  -Eliquis 5 mg twice daily  HFpEF  UOP 3.9 L.  On exam, appears euvolemic. -Diltiazem 120 mg nightly -Flecainide 100 mg twice daily -Lasix 80 mg daily -Metoprolol 12.5 twice daily  HTN  BP 93/60 this a.m., MAP 77.  And validated blood pressures overnight intermittently hypertensive, 154/99. -Patient on metoprolol 12.5 twice daily -monitor with vitals   MDD/ Anxiety  -Atarax 25 mg twice daily as needed -Effexor 150mg  daily   GERD   -Pepcid 20 mg daily -Protonix 40  FEN/GI: Heart healthy PPx: Eliquis  Disposition: Home health PT/OT, orders in.  Pending clinical improvement, hopefully home in the next few days.  Subjective:  Patient reports that her breathing is 50% back to normal.  Denies complaints aside from a small area on her left hip that is bleeding.   Objective: Temp:  [97.9 F (36.6 C)-99 F (37.2 C)] 98.6 F (37 C) (12/28 0814) Pulse Rate:  [88-116] 101 (12/28 0804) Resp:  [20-25] 24 (12/28 0804) BP: (93-141)/(59-83) 93/68 (12/28 0814) SpO2:  [94 %-99 %] 96 % (12/28 0804) FiO2 (%):  [100 %] 100 % (12/27 1331)  Physical Exam:  General: 63 y.o. female in NAD Cardio: RRR no m/r/g Lungs: diffuse mild crackles, no wheezing, no IWOB on 5L  Abdomen: Soft, non-tender to palpation, non-distended Skin: warm and dry, 1.5cm hard area without overlying erythema, drains small amount of blood with pressure, non-tender Extremities: trace BLE edema      Laboratory: Recent Labs  Lab 06/21/19 0321 06/21/19 1157 06/22/19 0244 06/23/19 0300  WBC 11.0*  --  12.0* 9.3  HGB  10.1* 11.2* 9.4* 9.7*  HCT 34.9* 33.0* 32.1* 33.9*  PLT 617*  --  631* 655*   Recent Labs  Lab 06/21/19 0321 06/21/19 1157 06/22/19 0244  NA 140 138 139  K 3.8 4.3 4.7  CL 96*  --  96*  CO2 36*  --  36*  BUN 10  --  12  CREATININE 0.54  --  0.61  CALCIUM 8.2*  --  8.5*  GLUCOSE 130*  --  149*    Imaging/Diagnostic Tests: No results found.  [place], DO 06/24/2019, 8:30 AM PGY-2, [place] Intern pager: [phone], text pages welcome

## 2019-06-24 NOTE — Consult Note (Signed)
NAME:  ZILDA BOMBERGER, MRN:  [id number], DOB:  1956-03-12, LOS: 3 ADMISSION DATE:  06/21/2019, CONSULTATION DATE: 06/22/2019 REFERRING MD: [facility]family practice, CHIEF COMPLAINT: Dyspnea  HPI/course in hospital  63 year old woman who presented with acute on chronic hypoxic and hypercapnic respiratory failure which is much improved after BiPAP.  She is well-known to our service and is followed by Dr. Lamonte Sakai for a history of vocal cord dysfunction obstructive sleep apnea on CPAP diastolic heart failure and obesity hypoventilation syndrome.  She has had several exacerbations over the last several months and has been on chronic steroids.  She was last seen in our office on 12/21 where she was given diuretics and steroids.  Similar treatment was initiated on arrival here but for respiratory failure she was started on BiPAP.  She has noticed a significant improvement and was able to sleep well for the first time in months.  Past Medical History   Past Medical History:  Diagnosis Date  . Abdominal wall hematoma 03/06/2019  . Acute bronchitis 08/29/2018  . Acute on chronic respiratory failure with hypoxia ([place]) 08/29/2018  . Anxiety   . Asthma   . Chronic lower back pain   . COPD (chronic obstructive pulmonary disease) ([place])   . Depression   . Exposure to COVID-19 virus 11/02/2018  . Fall 02/2019  . Family history of anesthesia complication    "daughter; causes her to pass out afterwards"  . GERD (gastroesophageal reflux disease)   . History of atrial flutter 06/26/2015  . History of pulmonary embolus (PE) 11/03/2014  . Hypertension   . Hyperthyroidism   . Left medial tibial stress syndrome 12/26/2017  . Lower GI bleed   . Migraine    "monthly" (12/28/2013)  . Non-traumatic rhabdomyolysis 11/03/2014  . Obesity hypoventilation syndrome ([place]) 03/06/2019  . Osteoarthritis    "both knees; back of my neck; right pelvic bone" (12/28/2013)  . Paroxysmal A-fib ([place])   . Pulmonary embolism ([place]) 12/28/2013     "2 clots in each lung"     Past Surgical History:  Procedure Laterality Date  . ABDOMINAL HYSTERECTOMY    . APPENDECTOMY    . BREAST CYST EXCISION Right   . DILATION AND CURETTAGE OF UTERUS    . ELECTROPHYSIOLOGIC STUDY N/A 05/27/2015   Procedure: A-Flutter;  Surgeon: Evans Lance, MD;  Location: [place] [facility];  Service: Cardiovascular;  Laterality: N/A;  . EXCISIONAL HEMORRHOIDECTOMY    . NASAL SINUS SURGERY  2007  . THYROIDECTOMY, PARTIAL Right 2005  . TUBAL LIGATION    . WISDOM TOOTH EXTRACTION        Interim history/subjective:  No events overnight. Wore BiPAP overnight. Currently on 5L [place] with SpO2 99%  Objective   Blood pressure 112/78, pulse 93, temperature 98.6 F (37 C), temperature source Oral, resp. rate 16, height 5\' 5"  (1.651 m), weight (!) 171 kg, SpO2 98 %.        Intake/Output Summary (Last 24 hours) at 06/24/2019 1414 Last data filed at 06/24/2019 1242 Gross per 24 hour  Intake 360 ml  Output 4300 ml  Net -3940 ml   Filed Weights   06/21/19 0308 06/22/19 0300 06/23/19 0345  Weight: (!) 161.5 kg (!) 171 kg (!) 171 kg    Physical Exam: General: Morbidly obese habitus, well-appearing, no acute distress HENT: [place], AT, OP clear, MMM Eyes: EOMI, no scleral icterus Respiratory: Clear to auscultation bilaterally.  No crackles, wheezing or rales Cardiovascular: RRR, -M/R/G, no JVD GI: BS+, soft, nontender  Extremities:Non-pitting edema in lower extremity,-tenderness Neuro: AAO x4, CNII-XII grossly intact Skin: Intact, no rashes or bruising Psych: Normal mood, normal affect    Ancillary tests (personally reviewed)  CBC: Recent Labs  Lab 06/21/19 0313 06/21/19 0321 06/21/19 1157 06/22/19 0244 06/23/19 0300  WBC  --  11.0*  --  12.0* 9.3  NEUTROABS  --  7.9*  --   --   --   HGB 11.2* 10.1* 11.2* 9.4* 9.7*  HCT 33.0* 34.9* 33.0* 32.1* 33.9*  MCV  --  91.8  --  90.7 92.4  PLT  --  617*  --  631* 655*    Basic Metabolic Panel: Recent Labs   Lab 06/21/19 0313 06/21/19 0321 06/21/19 1157 06/22/19 0244  NA 139 140 138 139  K 3.9 3.8 4.3 4.7  CL  --  96*  --  96*  CO2  --  36*  --  36*  GLUCOSE  --  130*  --  149*  BUN  --  10  --  12  CREATININE  --  0.54  --  0.61  CALCIUM  --  8.2*  --  8.5*   ABG    Component Value Date/Time   PHART 7.367 06/21/2019 1745   PCO2ART 65.3 (HH) 06/21/2019 1745   PO2ART 95.3 06/21/2019 1745   HCO3 36.5 (H) 06/21/2019 1745   TCO2 43 (H) 06/21/2019 1157   O2SAT 97.1 06/21/2019 1745    Assessment & Plan:   Severe obstructive sleep apnea and obesity hypoventilation syndrome with chronic hypercapnia. Chronic atrial fibrillation on antiarrhythmic agents and apixaban for stroke prevention Chronic diastolic heart failure which appears well compensated. Type 2 diabetes which is well controlled.  Tolerating BiPAP. Currently on auto setting 4-20. Improved clinical status for her chronic hypercapnia related to suspected OSA/OHS in setting of morbid obesity. Patient requires BiPAP nightly and as needed during the day when sleeping to treat elevated PCO2 levels to reduce risk of exacerbations and future hospitalizations when compliant with recommended BiPAP treatment. Patient is able to protect their airways and clear secretions on their own.  Plan Discussed with case management and [employer] health liason [facility] @ [phone] ) to arrange for a unit at home with plan for sleep study to be obtained as soon as possible.  --Continue BiPAP nightly --Will arrange for outpatient Pulmonary visit with me in one week for follow-up of suspected OSA/OHS --If sleep study not arranged on discharge, will coordinate testing during clinic visit --Continue diuretics as tolerated for heart failure  Rodman Pickle, M.D. [facility] Pulmonary/Critical Care Medicine 06/24/2019 2:17 PM

## 2019-06-24 NOTE — Evaluation (Signed)
Occupational Therapy Evaluation Patient Details Name: [name] White MRN: [id number] DOB: Nov 09, 1955 Today's Date: 06/24/2019    History of Present Illness 63 y.o. female admitted on 06/21/19 for SOB.  Dx with acute on chronic hypoxic hypercapneic respiratory failure likely multifactorial due to chronic problems, suspected OHS/OSA.  Pt with significant PMH of PE, PAF, OA, migraine, HTN, fall, COPD, chronic low back pain, acute on chronic respiratory failure wiht hypoxia.     Clinical Impression   PTA patient independent with limited mobility using rollator, some assist with LB ADLs from niece.  Admitted for above and limited by problem list below, including decreased activity tolerance, impaired balance and generalized weakness. She currently is able to complete transfers with min guard assist, toileting with min assist, LB ADLs with min assist and UB ADLs with setup assist.  Pt on 5L supplemental O2 via [place] (normally on 3L), with SpO2 maintained throughout session given verbal cueing for PLB and pacing.  She will benefit from continued OT services while admitted, recommend pt resume [place] services at [place] home with assist from aide.  Will follow acutely.      Follow Up Recommendations  Home health OT;Supervision - Intermittent;Other (comment)(HH aide)    Equipment Recommendations  Hospital bed    Recommendations for Other Services       Precautions / Restrictions Precautions Precautions: Fall Precaution Comments: watch O2 sat Restrictions Weight Bearing Restrictions: No      Mobility Bed Mobility Overal bed mobility: Modified Independent                Transfers Overall transfer level: Needs assistance   Transfers: Sit to/from Stand;Stand Pivot Transfers Sit to Stand: Min guard Stand pivot transfers: Min guard       General transfer comment: min guard for safety and balance    Balance Overall balance assessment: Needs assistance Sitting-balance support: Feet  supported;No upper extremity supported Sitting balance-Leahy Scale: Fair     Standing balance support: Single extremity supported;During functional activity Standing balance-Leahy Scale: Poor Standing balance comment: reliant on external support                           ADL either performed or assessed with clinical judgement   ADL Overall ADL's : Needs assistance/impaired     Grooming: Set up;Sitting   Upper Body Bathing: Set up;Supervision/ safety;Sitting   Lower Body Bathing: Minimal assistance;Sit to/from stand   Upper Body Dressing : Set up;Sitting   Lower Body Dressing: Minimal assistance;Sit to/from stand   Toilet Transfer: Min guard;Stand-pivot;BSC   Toileting- Water quality scientist and Hygiene: Min guard;Sitting/lateral lean;Sit to/from stand       Functional mobility during ADLs: Min guard General ADL Comments: pt limited by decreased activity tolerance, endurance, generalized weakness      Vision   Vision Assessment?: No apparent visual deficits     Perception     Praxis      Pertinent Vitals/Pain Pain Assessment: Faces Faces Pain Scale: Hurts little more Pain Location: headache Pain Descriptors / Indicators: Aching Pain Intervention(s): Limited activity within patient's tolerance;Monitored during session;Repositioned     Hand Dominance Right   Extremity/Trunk Assessment Upper Extremity Assessment Upper Extremity Assessment: Generalized weakness   Lower Extremity Assessment Lower Extremity Assessment: Defer to PT evaluation   Cervical / Trunk Assessment Cervical / Trunk Assessment: Normal   Communication Communication Communication: No difficulties   Cognition Arousal/Alertness: Awake/alert Behavior During Therapy: WFL for tasks assessed/performed Overall Cognitive Status:  Within Functional Limits for tasks assessed                                     General Comments  pt on 5L with VSS during session; cueing  for pacing; seated EOB upon exit with RN aware of position and spot bleeding from L hip     Exercises     Shoulder Instructions      Home Living Family/patient expects to be discharged to:: Private residence Living Arrangements: Other relatives(niece) Available Help at Discharge: Family;Available PRN/intermittently;Other (Comment)(HH aide 2 days/week) Type of Home: Apartment Home Access: Stairs to enter Entrance Stairs-Number of Steps: 1 Entrance Stairs-Rails: None Home Layout: One level     Bathroom Shower/Tub: Teacher, early years/pre: Standard     Home Equipment: Environmental consultant - 2 wheels;Cane - single point;Walker - 4 wheels;Hospital bed;Other (comment);Tub bench;Bedside commode(home O2, hospital bed broken) Adaptive Equipment: Reacher;Sock aid;Long-handled shoe horn;Long-handled sponge Additional Comments: lives with niece      Prior Functioning/Environment Level of Independence: Needs assistance  Gait / Transfers Assistance Needed: Per pt goes from couch to [facility] and back (only a couple of feet) her hospital bed is broken and she is waiting on a new one, she wears 3L O2 [place] at baseline, but increased to 5L "as I was getting sick" Active HHPT and OT through [facility]. ADL's / Homemaking Assistance Needed: patient reports some assist required for ADLs from neice (LB and back mostly), able to use BSC on her own; reports aide has not started with her yet             OT Problem List: Decreased activity tolerance;Impaired balance (sitting and/or standing);Decreased knowledge of use of DME or AE;Decreased knowledge of precautions;Cardiopulmonary status limiting activity;Obesity;Decreased strength      OT Treatment/Interventions: Self-care/ADL training;Therapeutic exercise;Energy conservation;DME and/or AE instruction;Therapeutic activities;Patient/family education;Balance training    OT Goals(Current goals can be found in the care plan section) Acute Rehab OT Goals Patient Stated  Goal: to not come back to the hospital so frequently (has been 3 times this month) OT Goal Formulation: With patient Time For Goal Achievement: 07/08/19 Potential to Achieve Goals: Good  OT Frequency: Min 2X/week   Barriers to D/C:            Co-evaluation              AM-PAC OT "6 Clicks" Daily Activity     Outcome Measure Help from another person eating meals?: None Help from another person taking care of personal grooming?: A Little Help from another person toileting, which includes using toliet, bedpan, or urinal?: A Little Help from another person bathing (including washing, rinsing, drying)?: A Little Help from another person to put on and taking off regular upper body clothing?: A Little Help from another person to put on and taking off regular lower body clothing?: A Little 6 Click Score: 19   End of Session Equipment Utilized During Treatment: Oxygen(5L) Nurse Communication: Mobility status;Other (comment)(spot bleeding from L hip)  Activity Tolerance: Patient tolerated treatment well Patient left: with call bell/phone within reach;Other (comment)(seated EOB )  OT Visit Diagnosis: Unsteadiness on feet (R26.81);Muscle weakness (generalized) (M62.81)                Time: [id number] OT Time Calculation (min): 15 min Charges:  OT General Charges $OT Visit: 1 Visit OT Evaluation $OT Eval Low Complexity: 1 Low  Jolaine Artist, OT Acute Rehabilitation Services Pager [phone] Office [phone]   Delight Stare 06/24/2019, 11:45 AM

## 2019-06-24 NOTE — Consult Note (Signed)
   [facility] CM Inpatient Consult   06/24/2019  NAJAI JOLIVET 11-08-1955 [id number]   Patient was screened for [place] Management services. Patient will have the transition of care call conducted by the primary care provider, [name], MD at [place].  Patient showing as extreme high risk score [50%] for unplanned readmissions, patient with [facility] HMO.  Call attempts to speak with patient in the hospital room for potential needs identified for post hospital follow up without success.   Plan: Notification sent to the [facility] Management and made aware patient's readmission. Please contact for further questions,  Natividad Brood, RN BSN [facility] Liaison  [phone] business mobile phone Toll free office [phone]  Fax number: [phone] [email or web address]

## 2019-06-25 ENCOUNTER — Other Ambulatory Visit: Payer: Self-pay

## 2019-06-25 ENCOUNTER — Telehealth: Payer: Self-pay | Admitting: *Deleted

## 2019-06-25 ENCOUNTER — Telehealth: Payer: Medicare HMO

## 2019-06-25 ENCOUNTER — Ambulatory Visit: Payer: Self-pay

## 2019-06-25 DIAGNOSIS — E662 Morbid (severe) obesity with alveolar hypoventilation: Secondary | ICD-10-CM

## 2019-06-25 LAB — BASIC METABOLIC PANEL
Anion gap: 10 (ref 5–15)
BUN: 8 mg/dL (ref 8–23)
CO2: 36 mmol/L — ABNORMAL HIGH (ref 22–32)
Calcium: 9 mg/dL (ref 8.9–10.3)
Chloride: 93 mmol/L — ABNORMAL LOW (ref 98–111)
Creatinine, Ser: 0.59 mg/dL (ref 0.44–1.00)
GFR calc Af Amer: >60 mL/min (ref >60)
GFR calc non Af Amer: >60 mL/min (ref >60)
Glucose, Bld: 130 mg/dL — ABNORMAL HIGH (ref 70–99)
Potassium: 4.5 mmol/L (ref 3.5–5.1)
Sodium: 139 mmol/L (ref 135–145)

## 2019-06-25 LAB — CBC
HCT: 33.3 % — ABNORMAL LOW (ref 36.0–46.0)
Hemoglobin: 9.6 g/dL — ABNORMAL LOW (ref 12.0–15.0)
MCH: 26.5 pg (ref 26.0–34.0)
MCHC: 28.8 g/dL — ABNORMAL LOW (ref 30.0–36.0)
MCV: 92 fL (ref 80.0–100.0)
Platelets: 618 10*3/uL — ABNORMAL HIGH (ref 150–400)
RBC: 3.62 MIL/uL — ABNORMAL LOW (ref 3.87–5.11)
RDW: 18.6 % — ABNORMAL HIGH (ref 11.5–15.5)
WBC: 11 10*3/uL — ABNORMAL HIGH (ref 4.0–10.5)
nRBC: 0 % (ref 0.0–0.2)

## 2019-06-25 LAB — GLUCOSE, CAPILLARY
Glucose-Capillary: 116 mg/dL — ABNORMAL HIGH (ref 70–99)
Glucose-Capillary: 97 mg/dL (ref 70–99)
Glucose-Capillary: 90 mg/dL (ref 70–99)
Glucose-Capillary: 171 mg/dL — ABNORMAL HIGH (ref 70–99)

## 2019-06-25 MED ORDER — PROCHLORPERAZINE EDISYLATE 10 MG/2ML IJ SOLN
5.0000 mg | Freq: Once | INTRAMUSCULAR | Status: AC | PRN
  Administered 2019-06-25: 5 mg via INTRAVENOUS
  Filled 2019-06-25: qty 1

## 2019-06-25 NOTE — Telephone Encounter (Signed)
ATC patient unable to reach, patient still admitted to hospital. Will try again when she is discharged

## 2019-06-25 NOTE — Telephone Encounter (Signed)
-----   Message from [place], MD sent at 06/24/2019  4:50 PM EST ----- Regarding: Hospital follow-up Please schedule patient for follow-up with me January 4th for 30 min slot for hospital follow-up - [name] White

## 2019-06-25 NOTE — Progress Notes (Signed)
Physical Therapy Treatment Patient Details Name: [name] White MRN: [id number] DOB: [birth year] Today's Date: 06/25/2019    History of Present Illness 63 y.o. female admitted on 06/21/19 for SOB.  Dx with acute on chronic hypoxic hypercapneic respiratory failure likely multifactorial due to chronic problems, suspected OHS/OSA.  Pt with significant PMH of PE, PAF, OA, migraine, HTN, fall, COPD, chronic low back pain, acute on chronic respiratory failure wiht hypoxia.      PT Comments    Pt con't to be anxious upon feeling SOB/DOE requiring verbal cues for deep breathing. Pt tolerated 3 short bouts of ambulation with sitting rest breaks in between. Anticipate pt will con't to progress ambulation tolerance and be able to return home. Pt functioning at minA level more so due to SOB. Acute PT to cont to follow.   Follow Up Recommendations  Home health PT;Supervision - Intermittent;Other (comment)     Equipment Recommendations  Hospital bed;Other (comment)    Recommendations for Other Services       Precautions / Restrictions Precautions Precautions: Fall Precaution Comments: watch O2 stat, pt also anxious Restrictions Weight Bearing Restrictions: No    Mobility  Bed Mobility               General bed mobility comments: pt up in chair upon PT arrival  Transfers Overall transfer level: Needs assistance Equipment used: Rolling walker (2 wheeled) Transfers: Sit to/from Stand Sit to Stand: Min assist Stand pivot transfers: Min assist       General transfer comment: minA for line management and to hold Rollator and BSC steady due to pt's body habitus  Ambulation/Gait Ambulation/Gait assistance: Min assist Gait Distance (Feet): 10 Feet(x1, 15x1, 20x1) Assistive device: 4-wheeled walker(bariatric) Gait Pattern/deviations: Step-through pattern;Decreased stride length;Wide base of support Gait velocity: dec Gait velocity interpretation: <1.31 ft/sec, indicative of  household ambulator General Gait Details: max encouragement to keep going, SPO2 >96% on 5LO2 via [place], pt however reports 3/4 on DOE scale. HR up to 120s during amb, immeadiately returning to 100s upon sitting, pt completed 3 bouts, each more than the other   Stairs             Wheelchair Mobility    Modified Rankin (Stroke Patients Only)       Balance Overall balance assessment: Needs assistance Sitting-balance support: Feet supported;No upper extremity supported Sitting balance-Leahy Scale: Fair     Standing balance support: Single extremity supported;During functional activity Standing balance-Leahy Scale: Poor Standing balance comment: reliant on external support                            Cognition Arousal/Alertness: Awake/alert Behavior During Therapy: Anxious Overall Cognitive Status: Within Functional Limits for tasks assessed                                 General Comments: pt gets anxious upon feeling SOB/DOE, pt responds well to verbal cues for deep breathing      Exercises      General Comments General comments (skin integrity, edema, etc.): pt assisted to [facility], pt able to stand and perform pericare with set up and min guard      Pertinent Vitals/Pain Pain Assessment: 0-10 Pain Score: 6  Pain Location: headache Pain Descriptors / Indicators: Headache Pain Intervention(s): Monitored during session    Home Living  Prior Function            PT Goals (current goals can now be found in the care plan section) Progress towards PT goals: Progressing toward goals    Frequency    Min 3X/week      PT Plan Current plan remains appropriate    Co-evaluation              AM-PAC PT "6 Clicks" Mobility   Outcome Measure  Help needed turning from your back to your side while in a flat bed without using bedrails?: A Little Help needed moving from lying on your back to sitting on the side  of a flat bed without using bedrails?: A Little Help needed moving to and from a bed to a chair (including a wheelchair)?: A Little Help needed standing up from a chair using your arms (e.g., wheelchair or bedside chair)?: A Little Help needed to walk in hospital room?: A Little Help needed climbing 3-5 steps with a railing? : A Lot 6 Click Score: 17    End of Session Equipment Utilized During Treatment: Oxygen(5Lo2 via [place]) Activity Tolerance: Patient limited by fatigue Patient left: in chair;with call bell/phone within reach Nurse Communication: Mobility status PT Visit Diagnosis: Muscle weakness (generalized) (M62.81);Difficulty in walking, not elsewhere classified (R26.2)     Time: [id number] PT Time Calculation (min) (ACUTE ONLY): 31 min  Charges:  $Gait Training: 8-22 mins $Therapeutic Activity: 8-22 mins                     Kittie Plater, PT, DPT Acute Rehabilitation Services Pager #: [phone] Office #: [phone]    Berline Lopes 06/25/2019, 1:20 PM

## 2019-06-25 NOTE — Consult Note (Signed)
   [facility] CM Inpatient Consult   06/25/2019  [name] White [birth year] [id number]   Call received from [place] call the patient's room but unable to get patient call placed to patient's nurse and was able to get the phone to the patient. HIPAA verified.   Patient states, "I have been so sick and I am not feeling quite well yet.  I have been to the hospital 3 times this month alone."  Expressed empathy and encouraged patient to verbalize concerns.  Patient states that the address in [facility] is her sister's address and she is "not allowed to go back there" she says.  She states that she does live with her niece Laruth Bouchard and confirms it's alright to speak with niece at [phone].  She states, my niece lives on [address] but I don't know the exact address.  She states her own cell phone number is correct at [phone] but states, "I've been so sick I just haven't felt like answering it."  Encouraged her to reach out to her primary care Dr. [name] at [place] there is Lazaro Arms who will be following up.  Patient verbally agrees to the follow up.  Plan: Will follow up with the [facility] at [place].  For questions, please contact:   Natividad Brood, RN BSN [facility] Liaison  [phone] business mobile phone Toll free office [phone]  Fax number: [phone] [email or web address]

## 2019-06-25 NOTE — Chronic Care Management (AMB) (Signed)
  Care Management     Care Management Outreach Note  06/25/2019 Name: LANAIYA GRIZZEL MRN: [id number] DOB: 10-21-1955  FERRYN KIESOW is a 63 y.o. year old female who is a primary care patient of [name], MD . The Care Management team was consulted for assistance withHopital F/u 3rd attempt.  RN Care Manager was scheduled to reach out to  [place] today by phone to the patient for hospital follow up 3rd attempt.  Notified that the patient was admitted back into the hospital  for acute respiratory failure on 06/21/19.  Follow Up Plan:RN Case Manager Collaborated with [facility] Liaison to let me know when patient will D/C from the hospital and a good contact number to call for follow up.   Lazaro Arms RN, BSN, [facility] Care Management Coordinator [place] Phone: [phone] Office: [phone] Fax: [phone]

## 2019-06-25 NOTE — Progress Notes (Addendum)
Family Medicine Teaching Service Daily Progress Note Intern Pager: [phone]  Patient name: [name] White Medical record number: [id number] Date of birth: September 01, 1955 Age: 63 y.o. Gender: female  Primary Care Provider: [name], MD Consultants: Fatima Sanger Code Status: DNR  Pt Overview and Major Events to Date:  06/21/19 - admitted, started on BIPAP 06/22/19- [place] 5 liters, Pulmonology consulted   Assessment and Plan: 63 year old with history of vocal cord dysfunction, HFpEF, OSA not on CPAP, chronic hypoxemic respiratory failure, obesity, atrial fibrillation, and pulmonary embolism presenting with acute on chronic hypoxemic hypercarbic respiratory failure that has improved after BiPAP.   Acute on chronic Hypoxic/Hypercapneic RespiratoryFailure, improving On home 3-4 L at home.  Pulmonology consultation on 12/26, recommended home BiPAP.  per note from 12/29, pulm has contacted [facility] liason to arrange for bipap to be obtained and for sleep study ASAP.   -Continuous pulse ox -nightly BiPAP, tolerated well  -Tylenol PRN  -Albuterol nebulizer every 2 hours as needed -Robitussin every 4 hours for cough as needed -DuoNeb twice daily -Pulmonology to help require home BiPAP for patient -Home health PT/OT, orders in -patient to follow up with pulmonology in 1 week and they will also arrange sleep study  -wean O2 to BL 3-4L, discussed with RN who plans to wean after patient is done PT  Small draining Abscess on Left Hip - bacitracin - wound care per nursing  Chest Pressure, resolved   -Monitor for symptoms  Suspected OSA/OHS  -Home BiPAP ordered  Paroxysmal Atrial Fibrillation  On Eliquis, also has history of PE.  Rate is well controlled  -Continue dilt  DM   AM glucose 130 on BMP. No home diabetes medications on list.  Last A1c 7.8 on 12/19.  Suspect that this is likely secondary to numerous steroid dosing for possible COPD exacerbations.  Hopefully now that it is  known she does not have COPD, no further steroids will be needed, and she will not have increased CBGs. -continue CBG checks 4 times daily -sSSI   Hx of PE  -Eliquis 5 mg twice daily  HFpEF  UOP 2.4L.  On exam, appears euvolemic. -Diltiazem 120 mg nightly -Flecainide 100 mg twice daily -Lasix 80 mg daily -Metoprolol 12.5 twice daily  HTN  BP 103/73 this a.m.   -Patient on metoprolol 12.5 twice daily -monitor with vitals   MDD/ Anxiety  -Atarax 25 mg twice daily as needed -Effexor 150mg  daily   GERD   -Pepcid 20 mg daily -Protonix 40  Nausea Reports nausea today.  States she has not taken anything for this yet.  QTC somewhat prolonged to 481 on 12/25. -Zofran discontinued -Compazine 5mg  once as needed  FEN/GI: Heart healthy PPx: Eliquis  Disposition: likely dc 12/30, home health orders for PT/OT placed on 12/28  Subjective:  Patient today states breathing is improved.  Does report some nausea.  Was able to eat her breakfast and all her morning medications.  Objective: Temp:  [98.3 F (36.8 C)-100 F (37.8 C)] 98.3 F (36.8 C) (12/29 0903) Pulse Rate:  [91-116] 104 (12/29 0903) Resp:  [16-22] 21 (12/29 0903) BP: (112-135)/(64-89) 123/64 (12/29 0903) SpO2:  [97 %-100 %] 97 % (12/29 0903) Physical Exam: General: awake and alert, laying in bed, NAD, [place] in place  Cardiovascular: RRR, no MRG Respiratory: CTAB, speaking full sentences  Abdomen: soft, non tender, non distended  Extremities: no edema   Laboratory: Recent Labs  Lab 06/22/19 0244 06/23/19 0300 06/25/19 0402  WBC 12.0* 9.3 11.0*  HGB 9.4* 9.7* 9.6*  HCT 32.1* 33.9* 33.3*  PLT 631* 655* 618*   Recent Labs  Lab 06/21/19 0321 06/21/19 1157 06/22/19 0244 06/25/19 0402  NA 140 138 139 139  K 3.8 4.3 4.7 4.5  CL 96*  --  96* 93*  CO2 36*  --  36* 36*  BUN 10  --  12 8  CREATININE 0.54  --  0.61 0.59  CALCIUM 8.2*  --  8.5* 9.0  GLUCOSE 130*  --  149* 130*     Imaging/Diagnostic  Tests: No new images   Caroline More, DO 06/25/2019, 9:50 AM PGY-3, [place] Intern pager: [phone], text pages welcome

## 2019-06-25 NOTE — Plan of Care (Signed)

## 2019-06-26 DIAGNOSIS — R509 Fever, unspecified: Secondary | ICD-10-CM

## 2019-06-26 LAB — RESPIRATORY PANEL BY PCR

## 2019-06-26 LAB — GLUCOSE, CAPILLARY
Glucose-Capillary: 170 mg/dL — ABNORMAL HIGH (ref 70–99)
Glucose-Capillary: 102 mg/dL — ABNORMAL HIGH (ref 70–99)
Glucose-Capillary: 176 mg/dL — ABNORMAL HIGH (ref 70–99)

## 2019-06-26 LAB — CBC
HCT: 36.4 % (ref 36.0–46.0)
Hemoglobin: 10.4 g/dL — ABNORMAL LOW (ref 12.0–15.0)
MCH: 26 pg (ref 26.0–34.0)
MCHC: 28.6 g/dL — ABNORMAL LOW (ref 30.0–36.0)
MCV: 91 fL (ref 80.0–100.0)
Platelets: 563 10*3/uL — ABNORMAL HIGH (ref 150–400)
RBC: 4 MIL/uL (ref 3.87–5.11)
RDW: 18.6 % — ABNORMAL HIGH (ref 11.5–15.5)
WBC: 10.8 10*3/uL — ABNORMAL HIGH (ref 4.0–10.5)
nRBC: 0 % (ref 0.0–0.2)

## 2019-06-26 LAB — SARS CORONAVIRUS 2 (TAT 6-24 HRS): SARS Coronavirus 2: NEGATIVE

## 2019-06-26 LAB — BASIC METABOLIC PANEL
Anion gap: 6 (ref 5–15)
BUN: 7 mg/dL — ABNORMAL LOW (ref 8–23)
CO2: 41 mmol/L — ABNORMAL HIGH (ref 22–32)
Calcium: 9.1 mg/dL (ref 8.9–10.3)
Chloride: 93 mmol/L — ABNORMAL LOW (ref 98–111)
Creatinine, Ser: 0.63 mg/dL (ref 0.44–1.00)
GFR calc Af Amer: >60 mL/min (ref >60)
GFR calc non Af Amer: >60 mL/min (ref >60)
Glucose, Bld: 109 mg/dL — ABNORMAL HIGH (ref 70–99)
Potassium: 4.2 mmol/L (ref 3.5–5.1)
Sodium: 140 mmol/L (ref 135–145)

## 2019-06-26 MED ORDER — PROCHLORPERAZINE EDISYLATE 10 MG/2ML IJ SOLN
5.0000 mg | Freq: Four times a day (QID) | INTRAMUSCULAR | Status: DC | PRN
  Administered 2019-06-26: 5 mg via INTRAVENOUS
  Filled 2019-06-26: qty 2

## 2019-06-26 MED ORDER — IPRATROPIUM-ALBUTEROL 0.5-2.5 (3) MG/3ML IN SOLN
3.0000 mL | Freq: Two times a day (BID) | RESPIRATORY_TRACT | Status: DC
  Administered 2019-06-26 – 2019-07-01 (×11): 3 mL via RESPIRATORY_TRACT
  Filled 2019-06-26 (×10): qty 3

## 2019-06-26 MED ORDER — PROCHLORPERAZINE EDISYLATE 10 MG/2ML IJ SOLN: 5.0000 mg | Freq: Four times a day (QID) | INTRAMUSCULAR | Status: DC | PRN

## 2019-06-26 NOTE — Progress Notes (Addendum)
Per [facility] liaison Zack Dr. Loanne Drilling ( pulmonologist) made aware will not d/c with bipap. Pt will need sleep study prior to receiving a bipap. [facility] works with [employer] to provide pt with mail out home sleep study. Pt needs to call  [facility] medical @ [phone], ex.[phone]) to process mail out sleep study prior to d/c. NCM made bedside nurse aware. Bedside nurse to assist pt with calling [facility] medical. Whitman Hero RN,BSN,CM

## 2019-06-26 NOTE — Telephone Encounter (Signed)
Please schedule patient to be seen by Dr. Lamonte Sakai or NP in two weeks for hospital follow-up. Please note that she has a home sleep study that needs to be followed up.

## 2019-06-26 NOTE — Progress Notes (Addendum)
Family Medicine Teaching Service Daily Progress Note Intern Pager: [phone]  Patient name: [name] White Medical record number: [id number] Date of birth: 04/21/1956 Age: 63 y.o. Gender: female  Primary Care Provider: [name], MD Consultants: Pulmonology  Code Status: DNR  Pt Overview and Major Events to Date:  06/21/19 - admitted, started on BIPAP 06/22/19 - Pulmonology consulted   Assessment and Plan: 63 year old with history of vocal cord dysfunction, HFpEF, OSA not on CPAP, chronic hypoxemic respiratory failure, obesity, atrial fibrillation, and pulmonary embolism presenting with acute on chronic hypoxemic hypercarbic respiratory failure that has improved after BiPAP.   Acute on chronic Hypoxic/Hypercapneic RespiratoryFailure, improving Satting in upper 90s-100 on 5L. On home3-4L at home. Pulmonology recommendations to continue qhs bipapa as well as outpatient f/u with pulm.  -Continuous pulse ox -nightly BiPAP, tolerated well  -Tylenol PRN  -Albuterol nebulizer every 2 hours as needed -Robitussin every 4 hours for cough as needed -DuoNeb bid -Pulmonology to help acquire home BiPAP for patient -Home health PT/OT, ordersin -patient to follow up with pulmonology in 1 week and they will also arrange sleep study  -wean O2 to BL 3-4L   Fever Patient with elevated temperature to 101.2.  Otherwise has been afebrile overnight.  We will plan to give Tylenol and see if this improves.  Unclear etiology of why patient is now becoming febrile.  Also with some associated tachycardia to low 100s. -can consider w/u if worsening fever: CXR, blood and urine cx, & consider broad spectrum abx  -EKG for new tachycardia  -will repeat COVID as well as obtain RVP for possible flu  Small draining Abscess on Left Hip - bacitracin - wound care per nursing  Chest Pressure, resolved  -Monitor for symptoms  Suspected OSA/OHS  -Home BiPAP ordered  Paroxysmal Atrial  Fibrillation On Eliquis, also has history of PE. Rate is well controlled  -Continue dilt  DM AM glucose 109 on BMP. No home diabetes medications on list. Last A1c 7.8 on 12/19. Likely 2/2 steroid use.  -continue CBG checks 4 times daily -sSSI   Hx of PE  -Eliquis 5 mg twice daily  HFpEF UOP 400cc on 12/29. On exam, appears euvolemic. -Diltiazem 120 mg nightly -Flecainide 100 mg twice daily -Lasix 80 mg daily -Metoprolol 12.5 twice daily  HTN BP 120/75 this a.m.   -Patient on metoprolol 12.5 twice daily -monitor with vitals   MDD/ Anxiety  -Atarax 25 mg twice daily as needed -Effexor 150mg  daily   GERD  -Pepcid 20 mg daily -Protonix 40  Nausea Reports nausea today and states that compazine worked well on 12/29.  States she has not taken anything for this yet.  QTC somewhat prolonged to 481 on 12/25. -Zofran discontinued -Compazine 5mg  q6h PRN  FEN/GI: Heart healthy PPx: Eliquis  Disposition: continued inpatient stay   Subjective:  She states she overall feels tired.  Denies any shortness of breath or chest pain.  Did have some shortness of breath this morning when she woke up but this improved after DuoNeb treatment.  States that he did have a fever and some nausea as well as headache.  For this yet.  Objective: Temp:  [98.3 F (36.8 C)-101.2 F (38.4 C)] 101.2 F (38.4 C) (12/30 0835) Pulse Rate:  [68-118] 68 (12/30 0850) Resp:  [21-27] 27 (12/30 0850) BP: (92-125)/(64-75) 125/75 (12/30 0835) SpO2:  [93 %-100 %] 95 % (12/30 0850) Physical Exam: General: awake and alert, laying in bed, several blankets on top of patient  Cardiovascular: RRR, no MRG  Respiratory: CTAB, no wheezes, rales, or rhonchi, speaking full sentences, no increased WOB  Abdomen: soft, non tender, non distended, bowel sounds present  Extremities: no edema   Laboratory: Recent Labs  Lab 06/23/19 0300 06/25/19 0402 06/26/19 0348  WBC 9.3 11.0* 10.8*  HGB 9.7* 9.6*  10.4*  HCT 33.9* 33.3* 36.4  PLT 655* 618* 563*   Recent Labs  Lab 06/22/19 0244 06/25/19 0402 06/26/19 0348  NA 139 139 140  K 4.7 4.5 4.2  CL 96* 93* 93*  CO2 36* 36* 41*  BUN 12 8 7*  CREATININE 0.61 0.59 0.63  CALCIUM 8.5* 9.0 9.1  GLUCOSE 149* 130* 109*     Imaging/Diagnostic Tests: No new images   Caroline More, DO 06/26/2019, 11:21 AM PGY-3, [place] Intern pager: [phone], text pages welcome

## 2019-06-26 NOTE — Progress Notes (Signed)
Pulmonary Progress Note  Spoke with [employer] liason [facility] @ [phone] ) who will arrange for home sleep study once discharge date is known. I have updated primary team (Dr. Tammi Klippel) who will check in with liason regarding discharge planning.  Patient has not answered Pulmonary office calls to arrange for follow-up in 1-2 weeks to follow-up on home sleep study. Please provide patient our office phone number at discharge to call and arrange appointment.    Pulmonary [phone]  Pulmonary will sign off. Please call consult team as needed.  Rodman Pickle, M.D. [facility] Pulmonary/Critical Care Medicine 06/26/2019 2:51 PM

## 2019-06-26 NOTE — Progress Notes (Signed)
Was able to contact Zack from [employer] and inform of discharge plans.   Dalphine Handing, PGY-3 [place] [facility] 06/26/2019 3:52 PM

## 2019-06-26 NOTE — Progress Notes (Signed)
Occupational Therapy Treatment Patient Details Name: [name] White MRN: [id number] DOB: Oct 27, 1955 Today's Date: 06/26/2019    History of present illness 63 y.o. female admitted on 06/21/19 for SOB.  Dx with acute on chronic hypoxic hypercapneic respiratory failure likely multifactorial due to chronic problems, suspected OHS/OSA.  Spiked fever 12/30, re-test COVID and respiratory panel. Pt with significant PMH of PE, PAF, OA, migraine, HTN, fall, COPD, chronic low back pain, acute on chronic respiratory failure wiht hypoxia.     OT comments  Patient supine in bed and agreeable to OT.  Patient completing transfers and in room mobility (limited to sink) with min guard assist.  Reports dizziness upon reaching sink, and BP assessed seated (see below). Assisted back to supine and RN notified of low BP and symptomatic with standing today. All other VSS on 5L [place]. Cueing throughout session due to anxiety, for pacing and focused breathing.   BP Seated after moblity:99/77, reclined in chair:103/68 and supine in bed:108/91    Follow Up Recommendations  Home health OT;Supervision - Intermittent;Other (comment)(HH aide )    Equipment Recommendations  Hospital bed    Recommendations for Other Services      Precautions / Restrictions Precautions Precautions: Fall Precaution Comments: watch O2 stat, pt also anxious Restrictions Weight Bearing Restrictions: No       Mobility Bed Mobility Overal bed mobility: Needs Assistance Bed Mobility: Supine to Sit;Sit to Supine     Supine to sit: Supervision Sit to supine: Supervision   General bed mobility comments: increased time and effort, supervision for safety/line mgmt   Transfers Overall transfer level: Needs assistance Equipment used: Rolling walker (2 wheeled) Transfers: Sit to/from Stand Sit to Stand: Min guard         General transfer comment: min guard for safety, balance; cueing for hand placement    Balance Overall balance  assessment: Needs assistance Sitting-balance support: No upper extremity supported;Feet supported Sitting balance-Leahy Scale: Fair     Standing balance support: Bilateral upper extremity supported;During functional activity Standing balance-Leahy Scale: Poor Standing balance comment: reliant on external support                           ADL either performed or assessed with clinical judgement   ADL Overall ADL's : Needs assistance/impaired     Grooming: Set up;Sitting                   Toilet Transfer: Min guard;Ambulation;RW Toilet Transfer Details (indicate cue type and reason): simulated to reclienr          Functional mobility during ADLs: Min guard;Rolling walker General ADL Comments: pt limited by dizziness, SOB and anxiety      Vision       Perception     Praxis      Cognition Arousal/Alertness: Awake/alert Behavior During Therapy: Anxious Overall Cognitive Status: Within Functional Limits for tasks assessed                                          Exercises     Shoulder Instructions       General Comments pt on 5L [place], SpO2> 92% throughout session; reports dizziness after short distance mobility to sink BP assessed 99/77, reclined in chair 103/68 and supine in bed 108/91 ; RN notified     Pertinent Vitals/ Pain  Pain Assessment: Faces Faces Pain Scale: No hurt  Home Living                                          Prior Functioning/Environment              Frequency  Min 2X/week        Progress Toward Goals  OT Goals(current goals can now be found in the care plan section)  Progress towards OT goals: Progressing toward goals  Acute Rehab OT Goals Patient Stated Goal: to feel better OT Goal Formulation: With patient  Plan Discharge plan remains appropriate;Frequency remains appropriate    Co-evaluation                 AM-PAC OT "6 Clicks" Daily Activity      Outcome Measure   Help from another person eating meals?: None Help from another person taking care of personal grooming?: A Little Help from another person toileting, which includes using toliet, bedpan, or urinal?: A Little Help from another person bathing (including washing, rinsing, drying)?: A Little Help from another person to put on and taking off regular upper body clothing?: A Little Help from another person to put on and taking off regular lower body clothing?: A Little 6 Click Score: 19    End of Session Equipment Utilized During Treatment: Oxygen(5L)  OT Visit Diagnosis: Unsteadiness on feet (R26.81);Muscle weakness (generalized) (M62.81)   Activity Tolerance Treatment limited secondary to medical complications (Comment)(dizziness)   Patient Left with call bell/phone within reach;in bed;with bed alarm set   Nurse Communication Mobility status        Time: [id number] OT Time Calculation (min): 34 min  Charges: OT General Charges $OT Visit: 1 Visit OT Treatments $Self Care/Home Management : 23-37 mins  [place] Pager [phone] Office [phone]    Delight Stare 06/26/2019, 4:38 PM

## 2019-06-27 LAB — BASIC METABOLIC PANEL
Anion gap: 9 (ref 5–15)
BUN: 8 mg/dL (ref 8–23)
CO2: 35 mmol/L — ABNORMAL HIGH (ref 22–32)
Calcium: 9 mg/dL (ref 8.9–10.3)
Chloride: 96 mmol/L — ABNORMAL LOW (ref 98–111)
Creatinine, Ser: 0.66 mg/dL (ref 0.44–1.00)
GFR calc Af Amer: >60 mL/min (ref >60)
GFR calc non Af Amer: >60 mL/min (ref >60)
Glucose, Bld: 158 mg/dL — ABNORMAL HIGH (ref 70–99)
Potassium: 4.6 mmol/L (ref 3.5–5.1)
Sodium: 140 mmol/L (ref 135–145)

## 2019-06-27 LAB — CBC
HCT: 34.4 % — ABNORMAL LOW (ref 36.0–46.0)
Hemoglobin: 9.9 g/dL — ABNORMAL LOW (ref 12.0–15.0)
MCH: 26.2 pg (ref 26.0–34.0)
MCHC: 28.8 g/dL — ABNORMAL LOW (ref 30.0–36.0)
MCV: 91 fL (ref 80.0–100.0)
Platelets: 522 10*3/uL — ABNORMAL HIGH (ref 150–400)
RBC: 3.78 MIL/uL — ABNORMAL LOW (ref 3.87–5.11)
RDW: 18.6 % — ABNORMAL HIGH (ref 11.5–15.5)
WBC: 11.1 10*3/uL — ABNORMAL HIGH (ref 4.0–10.5)
nRBC: 0 % (ref 0.0–0.2)

## 2019-06-27 LAB — GLUCOSE, CAPILLARY
Glucose-Capillary: 110 mg/dL — ABNORMAL HIGH (ref 70–99)
Glucose-Capillary: 138 mg/dL — ABNORMAL HIGH (ref 70–99)
Glucose-Capillary: 111 mg/dL — ABNORMAL HIGH (ref 70–99)
Glucose-Capillary: 147 mg/dL — ABNORMAL HIGH (ref 70–99)

## 2019-06-27 MED ORDER — BACITRACIN ZINC 500 UNIT/GM EX OINT: TOPICAL_OINTMENT | Freq: Two times a day (BID) | CUTANEOUS | 0 refills | Status: DC

## 2019-06-27 NOTE — Progress Notes (Signed)
Physical Therapy Treatment Patient Details Name: [name] White MRN: [id number] DOB: 03-Dec-1955 Today's Date: 06/27/2019    History of Present Illness 63 y.o. female admitted on 06/21/19 for SOB.  Dx with acute on chronic hypoxic hypercapneic respiratory failure likely multifactorial due to chronic problems, suspected OHS/OSA.  Spiked fever 12/30, re-test COVID and respiratory panel. Pt with significant PMH of PE, PAF, OA, migraine, HTN, fall, COPD, chronic low back pain, acute on chronic respiratory failure wiht hypoxia.      PT Comments    Patient progressing slowly due to decreased activity tolerance.  Able to get up with supervision, but limited to about 10' ambulation.  Completed 5 trials for ambulation without increased tolerance despite increased supplemental O2.  Feel she will need STSNF level rehab as not functioning well enough for home without assist and niece to be hospitalized at least until Monday.  PT to follow acutely.    Follow Up Recommendations  SNF     Equipment Recommendations  None recommended by PT    Recommendations for Other Services       Precautions / Restrictions Precautions Precautions: Fall Precaution Comments: watch O2 stat, pt also anxious    Mobility  Bed Mobility Overal bed mobility: Needs Assistance       Supine to sit: Supervision     General bed mobility comments: with elevated HOB and increased time  Transfers Overall transfer level: Needs assistance Equipment used: Rolling walker (2 wheeled) Transfers: Sit to/from Stand Sit to Stand: Supervision         General transfer comment: assist for safety  Ambulation/Gait Ambulation/Gait assistance: Supervision Gait Distance (Feet): 8 Feet(8'-10' x 5 reps with seated rests in between on 4-6L O2 throughout+) Assistive device: Rolling walker (2 wheeled) Gait Pattern/deviations: Step-to pattern;Step-through pattern;Trunk flexed;Wide base of support     General Gait Details: heave  UE support on RW with flexed posture throughout, self limited with audible noise with breathing, on 4L , then increased to 6L without appreciable increase in activity tolerance.  SpO2 91% or greater throughout   Stairs             Wheelchair Mobility    Modified Rankin (Stroke Patients Only)       Balance Overall balance assessment: Needs assistance Sitting-balance support: Feet supported Sitting balance-Leahy Scale: Good     Standing balance support: Bilateral upper extremity supported Standing balance-Leahy Scale: Poor Standing balance comment: reliant on external support                            Cognition Arousal/Alertness: Awake/alert Behavior During Therapy: Anxious Overall Cognitive Status: Within Functional Limits for tasks assessed                                 General Comments: anxious with SOB/DOE      Exercises      General Comments General comments (skin integrity, edema, etc.): Discussed plans for SNF as neice not going to be there at least until Monday and maybe longer as she is going into the hosptial herself.      Pertinent Vitals/Pain Pain Assessment: No/denies pain    Home Living                      Prior Function            PT Goals (current goals can  now be found in the care plan section) Progress towards PT goals: Progressing toward goals    Frequency    Min 3X/week      PT Plan Discharge plan needs to be updated    Co-evaluation              AM-PAC PT "6 Clicks" Mobility   Outcome Measure  Help needed turning from your back to your side while in a flat bed without using bedrails?: A Little Help needed moving from lying on your back to sitting on the side of a flat bed without using bedrails?: A Little Help needed moving to and from a bed to a chair (including a wheelchair)?: None Help needed standing up from a chair using your arms (e.g., wheelchair or bedside chair)?:  None Help needed to walk in hospital room?: A Little Help needed climbing 3-5 steps with a railing? : A Little 6 Click Score: 20    End of Session Equipment Utilized During Treatment: Oxygen Activity Tolerance: Patient limited by fatigue Patient left: in chair;with call bell/phone within reach Nurse Communication: Other (comment)(needs pure wick) PT Visit Diagnosis: Muscle weakness (generalized) (M62.81);Difficulty in walking, not elsewhere classified (R26.2)     Time: [id number] PT Time Calculation (min) (ACUTE ONLY): 49 min  Charges:  $Gait Training: 23-37 mins $Therapeutic Activity: 8-22 mins                     Magda Kiel, [place] [phone] 06/27/2019    Reginia Naas 06/27/2019, 2:50 PM

## 2019-06-27 NOTE — Progress Notes (Signed)
2000: Pt anxious; given Atarax. Will use call bell for requests; then request more actions/medications/tasks, then when staff returns requests more. C/o HA for the past 5 days - has not mentioned this to the AM RN before.

## 2019-06-27 NOTE — NC FL2 (Signed)
[place] MEDICAID FL2 LEVEL OF CARE SCREENING TOOL     IDENTIFICATION  Patient Name: [name] White Birthdate: 12-29-1955 Sex: female Admission Date (Current Location): 06/21/2019  [facility] [place] Number:  Herbalist and Address:  [facility], [place] [address], [place], [place] [address]      Provider Number: [id number]  Attending Physician Name and Address:  Lind Covert, MD  Relative Name and Phone Number:       Current Level of Care: Hospital Recommended Level of Care: [place] Prior Approval Number:    Date Approved/Denied:   PASRR Number: Manual review  Discharge Plan: SNF    Current Diagnoses: Patient Active Problem List   Diagnosis Date Noted  . Fever   . COPD (chronic obstructive pulmonary disease) ([place]) 06/21/2019  . Community acquired pneumonia 06/14/2019  . Weight gain due to medication 06/05/2019  . Acute respiratory failure with hypoxia ([place]) 05/28/2019  . Bronchitis with acute wheezing 05/09/2019  . Hyperglycemia 03/19/2019  . Acute on chronic respiratory failure with hypoxemia ([place]) 03/14/2019  . Acute on chronic heart failure with preserved ejection fraction ([place]) 03/14/2019  . Acquired thrombophilia ([place]) due to A-Fib 03/07/2019  . Heart failure with preserved ejection fraction ([place]) 03/06/2019  . Obesity hypoventilation syndrome ([place]) 03/06/2019  . Lower GI bleed   . Urge incontinence 01/27/2019  . COPD exacerbation ([place]) 08/29/2018  . COPD with acute exacerbation ([place]) 08/07/2018  . OSA (obstructive sleep apnea) 07/05/2018  . Respiratory failure ([place]) 07/05/2018  . Severe episode of recurrent major depressive disorder, without psychotic features ([place])   . MDD (major depressive disorder), recurrent episode, severe ([place]) 09/07/2015  . Atrial flutter ([place]) 07/29/2015  . Asthma 11/12/2014  . History of hypertension 11/05/2014  . Paroxysmal atrial fibrillation ([place]) 11/03/2014  .  Chronic anticoagulation 11/03/2014  . Major depressive disorder 11/03/2014  . History of pulmonary embolus (PE) 11/03/2014  . MDD (major depressive disorder), recurrent severe, without psychosis ([place]) 03/05/2014  . Vocal cord dysfunction 11/12/2013  . Hot flashes 04/22/2011  . OVERACTIVE BLADDER 04/18/2008  . Osteoarthritis of both knees 04/18/2008  . Osteoarthritis involving multiple joints on both sides of body 09/14/2007  . Morbid obesity ([place]) 08/24/2006  . RESTLESS LEGS SYNDROME 08/24/2006  . HYPERTENSION, BENIGN SYSTEMIC 08/24/2006  . RHINITIS, ALLERGIC 08/24/2006  . GASTROESOPHAGEAL REFLUX, NO ESOPHAGITIS 08/24/2006    Orientation RESPIRATION BLADDER Height & Weight     Self, Time, Situation, Place  O2([place] 4L) Incontinent Weight: (!) 373 lb 7.4 oz (169.4 kg) Height:  5\' 5"  (165.1 cm)  BEHAVIORAL SYMPTOMS/MOOD NEUROLOGICAL BOWEL NUTRITION STATUS      Continent Diet(cardiac, fluid restriction 1500 mL)  AMBULATORY STATUS COMMUNICATION OF NEEDS Skin   Limited Assist Verbally Normal                       Personal Care Assistance Level of Assistance  Bathing, Feeding, Dressing Bathing Assistance: Limited assistance Feeding assistance: Limited assistance Dressing Assistance: Limited assistance     Functional Limitations Info  Sight Sight Info: Impaired(wears glasses)        SPECIAL CARE FACTORS FREQUENCY  PT (By licensed PT), OT (By licensed OT)     PT Frequency: 5x/wk OT Frequency: 5x/wk            Contractures Contractures Info: Not present    Additional Factors Info  Code Status, Allergies, Psychotropic, Insulin Sliding Scale Code Status Info: DNR Allergies Info: Caffeine,  Hydralazine Hcl, Hydrocodone, Ciprofloxacin, Erythromycin, Lisinopril, Oxycodone, Sulfamethoxazole-trimethoprim Psychotropic Info: Effexor-XR 150mg  daily at breakfast, Effexor 37.5mg  daily at breakfast Insulin Sliding Scale Info: 0-9 units 3x/day with meals       Current  Medications (06/27/2019):  This is the current hospital active medication list Current Facility-Administered Medications  Medication Dose Route Frequency Provider Last Rate Last Admin  . acetaminophen (TYLENOL) tablet 650 mg  650 mg Oral Q6H PRN Carollee Leitz, MD   650 mg at 06/27/19 0630   Or  . acetaminophen (TYLENOL) suppository 650 mg  650 mg Rectal Q6H PRN Carollee Leitz, MD      . albuterol (PROVENTIL) (2.5 MG/3ML) 0.083% nebulizer solution 2.5 mg  2.5 mg Nebulization Q2H PRN Matilde Haymaker, MD   2.5 mg at 06/27/19 0436  . apixaban (ELIQUIS) tablet 5 mg  5 mg Oral BID Carollee Leitz, MD   5 mg at 06/27/19 [id number]  . azelastine (ASTELIN) 0.1 % nasal spray 2 spray  2 spray Each Nare Daily Carollee Leitz, MD   2 spray at 06/26/19 1115  . bacitracin ointment   Topical BID Cleophas Dunker, DO   Given at 06/25/19 2044  . cyclobenzaprine (FLEXERIL) tablet 10 mg  10 mg Oral Q8H PRN Bonnita Hollow, MD   10 mg at 06/26/19 2239  . cycloSPORINE (RESTASIS) 0.05 % ophthalmic emulsion 1 drop  1 drop Both Eyes BID Carollee Leitz, MD   1 drop at 06/26/19 2233  . diclofenac sodium (VOLTAREN) 1 % transdermal gel 2 g  2 g Topical QID PRN Carollee Leitz, MD   2 g at 06/24/19 2233  . diltiazem (CARDIZEM CD) 24 hr capsule 120 mg  120 mg Oral QHS Carollee Leitz, MD   120 mg at 06/26/19 2231  . famotidine (PEPCID) tablet 20 mg  20 mg Oral Daily Carollee Leitz, MD   20 mg at 06/27/19 [id number]  . flecainide (TAMBOCOR) tablet 100 mg  100 mg Oral BID Carollee Leitz, MD   100 mg at 06/27/19 [id number]  . fluticasone (FLONASE) 50 MCG/ACT nasal spray 2 spray  2 spray Each Nare Daily Carollee Leitz, MD   2 spray at 06/26/19 1115  . furosemide (LASIX) tablet 80 mg  80 mg Oral Daily Carollee Leitz, MD   80 mg at 06/27/19 [id number]  . guaiFENesin-dextromethorphan (ROBITUSSIN DM) 100-10 MG/5ML syrup 5 mL  5 mL Oral Q4H PRN Carollee Leitz, MD   5 mL at 06/25/19 2304  . hydrOXYzine (ATARAX/VISTARIL) tablet 25 mg  25 mg Oral BID PRN Carollee Leitz, MD   25 mg at  06/26/19 1626  . insulin aspart (novoLOG) injection 0-9 Units  0-9 Units Subcutaneous TID WC Carollee Leitz, MD   2 Units at 06/26/19 1252  . ipratropium-albuterol (DUONEB) 0.5-2.5 (3) MG/3ML nebulizer solution 3 mL  3 mL Nebulization BID Andrena Mews T, MD   3 mL at 06/27/19 0904  . loratadine (CLARITIN) tablet 10 mg  10 mg Oral Daily Carollee Leitz, MD   10 mg at 06/27/19 [id number]  . metoprolol tartrate (LOPRESSOR) tablet 12.5 mg  12.5 mg Oral BID Carollee Leitz, MD   12.5 mg at 06/27/19 0925  . montelukast (SINGULAIR) tablet 10 mg  10 mg Oral QHS Carollee Leitz, MD   10 mg at 06/26/19 2231  . pantoprazole (PROTONIX) EC tablet 40 mg  40 mg Oral Daily Carollee Leitz, MD   40 mg at 06/27/19 [id number]  . polyethylene glycol (MIRALAX / GLYCOLAX) packet 17 g  17 g Oral Daily  PRN Carollee Leitz, MD      . potassium chloride SA (KLOR-CON) CR tablet 40 mEq  40 mEq Oral BID Carollee Leitz, MD   40 mEq at 06/27/19 0924  . prochlorperazine (COMPAZINE) injection 5 mg  5 mg Intravenous Q6H PRN Andrena Mews T, MD      . venlafaxine XR (EFFEXOR-XR) 24 hr capsule 150 mg  150 mg Oral Q breakfast Martyn Malay, MD   150 mg at 06/27/19 [id number]   And  . venlafaxine [facility]) tablet 37.5 mg  37.5 mg Oral Q breakfast Martyn Malay, MD   37.5 mg at 06/27/19 [id number]     Discharge Medications: Please see discharge summary for a list of discharge medications.  Relevant Imaging Results:  Relevant Lab Results:   Additional Information SS#: [id number]  Geralynn Ochs, LCSW

## 2019-06-27 NOTE — TOC Progression Note (Signed)
Transition of Care [facility]) - Progression Note    Patient Details  Name: [name] White MRN: [id number] Date of Birth: 02-Aug-1955  Transition of Care [facility]) CM/SW [place], [place] Phone Number: 06/27/2019, 4:21 PM  Clinical Narrative:   CSW alerted by RN after patient was discharged that there were issues, as niece would not be home to accept patient home due to having to be hospitalized herself. CSW met with patient to discuss, explained to her how she was discharged and investigated the situation. Per patient, niece was refusing to take the patient back because she wasn't going to be home. CSW noted per chart review that patient was not recommended for 24/7 supervision per therapy evals, asked permission to call patient's niece. CSW spoke with niece, [name] White, who indicated that she is having her own health crisis and will be admitted for the weekend and there will be no one else at the house, and the patient is unable to cook and needs assistance with wiping herself and bathing. CSW asked PT to re-evaluate patient today based on information, and patient would qualify for SNF placement for rehab according to PT evaluation. Patient would prefer [facility]. CSW sent referral but [name] unable to accept patient as they are full. Patient has no other offers at this time. CSW to follow.          Expected Discharge Plan and Services           Expected Discharge Date: 06/27/19                                     Social Determinants of Health (SDOH) Interventions    Readmission Risk Interventions Readmission Risk Prevention Plan 03/08/2019  Transportation Screening Complete  Medication Review Press photographer) Complete  PCP or Specialist appointment within 3-5 days of discharge Complete  HRI or [place] Complete  SW Recovery Care/Counseling Consult Complete  [place] Not Applicable   Some recent data might be hidden

## 2019-06-27 NOTE — Progress Notes (Addendum)
Family Medicine Teaching Service Daily Progress Note Intern Pager: [phone]  Patient name: RHETA DADE Medical record number: [id number] Date of birth: 08/05/1955 Age: 63 y.o. Gender: female  Primary Care Provider: [name], MD Consultants: Pulmonology  Code Status: DNR  Pt Overview and Major Events to Date:  06/21/19 - admitted, started on BIPAP 06/22/19 - Pulmonology consulted  Assessment and Plan: 63 year old with history of vocal cord dysfunction, HFpEF, OSA not on CPAP, chronic hypoxemic respiratory failure, obesity, atrial fibrillation, and pulmonary embolism presenting with acute on chronic hypoxemic hypercarbic respiratory failure that has improved after BiPAP.   Acute on chronic Hypoxic/Hypercapneic RespiratoryFailure, improving Satting at 92% on 5L. On home3-4L at home.  I was able to wean her oxygen down to 4 L while I was in the room and she stayed above 95%. Pulmonology recommendations to continue qhs bipap as well as outpatient f/u with pulm. [facility] [employer] was contacted to arrange home sleep study so that patient can obtain bipap.  -Continuous pulse ox -nightly BiPAP  -Albuterol nebulizer every 2 hours as needed -Robitussin every 4 hours for cough as needed -DuoNeb bid -Pulmonology consulted, appreciate recommendations  -Home health PT/OT, ordersin -patient to follow up with pulmonology in 1 week and they will also arrange sleep study -wean O2 to BL 3-4L   Fever Resolved, now T 97.3. Patient with elevated temperature to 101.2 on 12/30. Unclear etiology of why patient was afebrile. RVP and repeat covid negative.  -can consider w/u if worsening fever: CXR, blood and urine cx, & consider broad spectrum abx  -EKG for new tachycardia   Small draining Abscess on Left Hip - bacitracin - wound care per nursing  Chest Pressure, resolved  -Monitor for symptoms  Suspected OSA/OHS  -Home BiPAP ordered  Paroxysmal Atrial  Fibrillation On Eliquis, also has history of PE. Rate is well controlled  -Continue dilt -Eliquis 5 mg twice daily  DM AM glucose 109 on BMP.No home diabetes medications on list. Last A1c 7.8 on 12/19. Likely 2/2 steroid use.  -continue CBG checks 4 times daily -sSSI   Hx of PE  -Eliquis 5 mg twice daily  HFpEF UOP2.3L on 12/30. On exam, appears euvolemic -Diltiazem 120 mg nightly -Flecainide 100 mg twice daily -Lasix 80 mg daily -Metoprolol 12.5 twice daily  HTN BP109/64this a.m.  -Patient on metoprolol 12.5 twice daily -monitor with vitals   MDD/ Anxiety  -Atarax 25 mg twice daily as needed -Effexor 150mg  daily   GERD  -Pepcid 20 mg daily -Protonix 40  Nausea QTC somewhat prolonged to 481on 12/25. -Compazine5mg q6h PRN  FEN/GI: Heart healthy PPx: Eliquis  Disposition: likely dc to home   Subjective:  Today states she overall feels well.  Would like to go home today but before noon.  States that she needs to get home before her menses to work.  States she would like someone to come to her house to stay with her when her niece is at work.  During exam I turned patient's options down to her home for the liters and keep stayed above 95%.  Objective: Temp:  [97.3 F (36.3 C)-101.2 F (38.4 C)] 97.3 F (36.3 C) (12/31 0328) Pulse Rate:  [68-118] 95 (12/30 2315) Resp:  [16-27] 16 (12/31 0320) BP: (101-125)/(52-77) 109/64 (12/31 0320) SpO2:  [93 %-100 %] 98 % (12/31 0436) Weight:  [169.4 kg] 169.4 kg (12/31 0500) Physical Exam: General: awake and alert, laying in bed, NAD, talking on the phone Cardiovascular: RRR, no MRG  Respiratory: CTAB, no wheezes, rales or rhonchi  Abdomen: soft, non tender, non distended, bowel sounds present  Extremities: no edema   Laboratory: Recent Labs  Lab 06/25/19 0402 06/26/19 0348 06/27/19 0359  WBC 11.0* 10.8* 11.1*  HGB 9.6* 10.4* 9.9*  HCT 33.3* 36.4 34.4*  PLT 618* 563* 522*   Recent Labs   Lab 06/25/19 0402 06/26/19 0348 06/27/19 0359  NA 139 140 140  K 4.5 4.2 4.6  CL 93* 93* 96*  CO2 36* 41* 35*  BUN 8 7* 8  CREATININE 0.59 0.63 0.66  CALCIUM 9.0 9.1 9.0  GLUCOSE 130* 109* 158*     Imaging/Diagnostic Tests: No new images   Caroline More, DO 06/27/2019, 7:41 AM PGY-3, [place] Intern pager: [phone], text pages welcome

## 2019-06-27 NOTE — TOC Transition Note (Signed)
Transition of Care [facility]) - CM/SW Discharge Note   Patient Details  Name: [name] White MRN: [id number] Date of Birth: [birth year]  [facility]) CM/SW Contact:  Benard Halsted, LCSW Phone Number: 06/27/2019, 10:32 AM   Clinical Narrative:    CSW notified [place] that patient is discharging today. Patient already has 3-N-1, Hospital bed, Tub bench, Walker rolling with seat, and oxygen at home. Please contact CSW if PTAR is needed for transportation.         Patient Goals and CMS Choice        Discharge Placement                       Discharge Plan and Services                                     Social Determinants of Health (SDOH) Interventions     Readmission Risk Interventions Readmission Risk Prevention Plan 03/08/2019  Transportation Screening Complete  Medication Review Press photographer) Complete  PCP or Specialist appointment within 3-5 days of discharge Complete  HRI or [place] Complete  SW Recovery Care/Counseling Consult Complete  [place] Not Applicable  Some recent data might be hidden

## 2019-06-28 LAB — BASIC METABOLIC PANEL
Anion gap: 9 (ref 5–15)
BUN: 7 mg/dL — ABNORMAL LOW (ref 8–23)
CO2: 36 mmol/L — ABNORMAL HIGH (ref 22–32)
Calcium: 8.8 mg/dL — ABNORMAL LOW (ref 8.9–10.3)
Chloride: 95 mmol/L — ABNORMAL LOW (ref 98–111)
Creatinine, Ser: 0.57 mg/dL (ref 0.44–1.00)
GFR calc Af Amer: >60 mL/min (ref >60)
GFR calc non Af Amer: >60 mL/min (ref >60)
Glucose, Bld: 127 mg/dL — ABNORMAL HIGH (ref 70–99)
Potassium: 3.9 mmol/L (ref 3.5–5.1)
Sodium: 140 mmol/L (ref 135–145)

## 2019-06-28 LAB — CBC
HCT: 34.5 % — ABNORMAL LOW (ref 36.0–46.0)
Hemoglobin: 10 g/dL — ABNORMAL LOW (ref 12.0–15.0)
MCH: 26.4 pg (ref 26.0–34.0)
MCHC: 29 g/dL — ABNORMAL LOW (ref 30.0–36.0)
MCV: 91 fL (ref 80.0–100.0)
Platelets: 557 10*3/uL — ABNORMAL HIGH (ref 150–400)
RBC: 3.79 MIL/uL — ABNORMAL LOW (ref 3.87–5.11)
RDW: 18.6 % — ABNORMAL HIGH (ref 11.5–15.5)
WBC: 9.1 10*3/uL (ref 4.0–10.5)
nRBC: 0 % (ref 0.0–0.2)

## 2019-06-28 LAB — GLUCOSE, CAPILLARY
Glucose-Capillary: 103 mg/dL — ABNORMAL HIGH (ref 70–99)
Glucose-Capillary: 210 mg/dL — ABNORMAL HIGH (ref 70–99)
Glucose-Capillary: 155 mg/dL — ABNORMAL HIGH (ref 70–99)
Glucose-Capillary: 123 mg/dL — ABNORMAL HIGH (ref 70–99)

## 2019-06-28 MED ORDER — TRAMADOL HCL 50 MG PO TABS
50.0000 mg | ORAL_TABLET | Freq: Two times a day (BID) | ORAL | Status: DC | PRN
  Administered 2019-06-28 – 2019-07-01 (×6): 50 mg via ORAL
  Filled 2019-06-28 (×6): qty 1

## 2019-06-28 NOTE — Progress Notes (Signed)
Patient request to take meds after meal. Meal tray ordered

## 2019-06-28 NOTE — Plan of Care (Signed)

## 2019-06-28 NOTE — Progress Notes (Addendum)
Family Medicine Teaching Service Daily Progress Note Intern Pager: [phone]  Patient name: [name] White Medical record number: [id number] Date of birth: January 21, 1956 Age: 64 y.o. Gender: female  Primary Care Provider: [name], MD Consultants: Pulmonology  Code Status: DNR  Pt Overview and Major Events to Date:  06/21/19 - admitted, started on BIPAP 06/22/19 - Pulmonology consulted  Assessment and Plan: 64 year old with history of vocal cord dysfunction, HFpEF, OSA not on CPAP, chronic hypoxemic respiratory failure, obesity, atrial fibrillation, and pulmonary embolism presenting with acute on chronic hypoxemic hypercarbic respiratory failure that has improved after BiPAP.  Currently on home oxygen requirement of 4 L during the day  Acute on chronic Hypoxic/Hypercapneic RespiratoryFailure, improving Satting at mid 90s on between 4 and 5 L of O2. On home3-4L at home.Pulmonology recommendations to continue qhs bipap as well as outpatient f/u with pulm. [facility] [employer] was contacted to arrange home sleep study so that patient can obtain bipap.  -Continuous pulse ox -nightly BiPAP, continue daily supplemental oxygen with goal of baseline of 3 to 4 L -Albuterol nebulizer every 2 hours as needed -Robitussin every 4 hours for cough as needed -DuoNeb bid -Pulmonology consulted, appreciate recommendations  -Home health PT/OT, ordersin -patient to follow up with pulmonology in 1 week and they will also arrange sleep study  [facility] Patient with elevated temperature to 101.2 on 12/30.  No leukocytosis.  Unclear etiology of why patient was afebrile. RVP and repeat covid negative.  EKG on 12/30 shows sinus tachycardia, otherwise normal -can consider w/u if worsening fever: CXR, blood and urine cx, & consider broad spectrum abx   Small draining Abscess on Left Hip - bacitracin - wound care per nursing  Chest Pressure, resolved  -Monitor for  symptoms  Suspected OSA/OHS  -Home BiPAP ordered  Paroxysmal Atrial Fibrillation On Eliquis, also has history of PE. Rate is well controlled  -Continue dilt -Eliquis 5 mg twice daily  DM AM glucose 103 on BMP.No home diabetes medications on list. Last A1c 7.8 on 12/19. Likely 2/2 steroid use.   Has not required insulin in the past 2 days. -continue CBG checks 4 times daily -sSSI   Hx of PE  -Eliquis 5 mg twice daily  HFpEF UOP2.3L on 12/30. No urine output recorded for past 24 hours.  On exam, appears euvolemic.  -Diltiazem 120 mg nightly -Flecainide 100 mg twice daily -Lasix 80 mg daily -Metoprolol 12.5 twice daily  HTN Normotensive.  BP decreases at night. -Patient on metoprolol 12.5 twice daily -monitor with vitals   MDD/ Anxiety  -Atarax 25 mg twice daily as needed -Effexor 150mg  daily   GERD  -Pepcid 20 mg daily -Protonix 40  Nausea QTC somewhat prolonged to 481on 12/25. -Compazine5mg q6h PRN  FEN/GI: Heart healthy PPx: Eliquis  Disposition: SNF.  Awaiting placement.  Medically stable for SNF  Subjective:  Patient feels well today.  Still having complaints of migraines.,  Which are chronic for her.  States that Tylenol helps slightly, but not adequate.  Otherwise, patient states she does not have any pain.  Feels like she is breathing at her baseline.  Objective: Temp:  [98.2 F (36.8 C)-98.7 F (37.1 C)] 98.7 F (37.1 C) (01/01 0700) Pulse Rate:  [80-114] 91 (01/01 0700) Resp:  [17-30] 23 (01/01 0700) BP: (88-123)/(51-82) 122/76 (01/01 0700) SpO2:  [92 %-100 %] 95 % (01/01 0747) Physical Exam: General: Alert and oriented.  Sitting on the side of bed.  No acute distress. Cardiovascular: Regular rate and  rhythm.  No murmurs.  Heart sounds are low Respiratory: Lungs clear to auscultation bilaterally.  No wheezes, no crackles.  Breath sounds slightly decreased.  Patient coughed during inhalation. Abdomen: Soft, nontender.  Large  pannus. Extremities: Lipedema, no pitting edema.  Laboratory: Recent Labs  Lab 06/26/19 0348 06/27/19 0359 06/28/19 0426  WBC 10.8* 11.1* 9.1  HGB 10.4* 9.9* 10.0*  HCT 36.4 34.4* 34.5*  PLT 563* 522* 557*   Recent Labs  Lab 06/26/19 0348 06/27/19 0359 06/28/19 0426  NA 140 140 140  K 4.2 4.6 3.9  CL 93* 96* 95*  CO2 41* 35* 36*  BUN 7* 8 7*  CREATININE 0.63 0.66 0.57  CALCIUM 9.1 9.0 8.8*  GLUCOSE 109* 158* 127*     Imaging/Diagnostic Tests: No new images   Benay Pike, MD 06/28/2019, 8:30 AM PGY-2, [place] Intern pager: [phone], text pages welcome

## 2019-06-29 DIAGNOSIS — J439 Emphysema, unspecified: Secondary | ICD-10-CM

## 2019-06-29 LAB — BASIC METABOLIC PANEL
Anion gap: 13 (ref 5–15)
BUN: 6 mg/dL — ABNORMAL LOW (ref 8–23)
CO2: 32 mmol/L (ref 22–32)
Calcium: 8.6 mg/dL — ABNORMAL LOW (ref 8.9–10.3)
Chloride: 92 mmol/L — ABNORMAL LOW (ref 98–111)
Creatinine, Ser: 0.48 mg/dL (ref 0.44–1.00)
GFR calc Af Amer: >60 mL/min (ref >60)
GFR calc non Af Amer: >60 mL/min (ref >60)
Glucose, Bld: 111 mg/dL — ABNORMAL HIGH (ref 70–99)
Potassium: 4.1 mmol/L (ref 3.5–5.1)
Sodium: 137 mmol/L (ref 135–145)

## 2019-06-29 LAB — CBC
HCT: 33.7 % — ABNORMAL LOW (ref 36.0–46.0)
Hemoglobin: 9.8 g/dL — ABNORMAL LOW (ref 12.0–15.0)
MCH: 26 pg (ref 26.0–34.0)
MCHC: 29.1 g/dL — ABNORMAL LOW (ref 30.0–36.0)
MCV: 89.4 fL (ref 80.0–100.0)
Platelets: 509 10*3/uL — ABNORMAL HIGH (ref 150–400)
RBC: 3.77 MIL/uL — ABNORMAL LOW (ref 3.87–5.11)
RDW: 18.5 % — ABNORMAL HIGH (ref 11.5–15.5)
WBC: 9.6 10*3/uL (ref 4.0–10.5)
nRBC: 0 % (ref 0.0–0.2)

## 2019-06-29 LAB — GLUCOSE, CAPILLARY
Glucose-Capillary: 98 mg/dL (ref 70–99)
Glucose-Capillary: 85 mg/dL (ref 70–99)
Glucose-Capillary: 101 mg/dL — ABNORMAL HIGH (ref 70–99)
Glucose-Capillary: 166 mg/dL — ABNORMAL HIGH (ref 70–99)

## 2019-06-29 MED ORDER — MELATONIN 3 MG PO TABS
3.0000 mg | ORAL_TABLET | Freq: Every evening | ORAL | Status: DC | PRN
  Administered 2019-06-29 – 2019-07-06 (×8): 3 mg via ORAL
  Filled 2019-06-29 (×9): qty 1

## 2019-06-29 NOTE — Progress Notes (Signed)
RT in to place patient on BiPAP for the night. Order is for BiPAP. Patient is under the impression she has been on BiPAP at night and it is doing great for her, however, when checking the machine at bedside the patient settings are still CPAP-auto titrate (max 20, min 5). Patient claims she has been resting so well at night, therefor, settings will not be adjusted per order (from 12/26) as this is what she is most comfortable with. Vitals stable. RT will continue to monitor.

## 2019-06-29 NOTE — Progress Notes (Signed)
Family Medicine Teaching Service Daily Progress Note Intern Pager: [phone]  Patient name: [name] White Medical record number: [id number] Date of birth: 1956-05-13 Age: 64 y.o. Gender: female  Primary Care Provider: [name], MD Consultants: Pulmonology  Code Status: DNR  Pt Overview and Major Events to Date:  06/21/19 - admitted, started on BIPAP 06/22/19 - Pulmonology consulted 06/27/19 - Medically stable for dc, awaiting SNF  Assessment and Plan: 64 year old with history of vocal cord dysfunction, HFpEF, OSA not on CPAP, chronic hypoxemic respiratory failure, obesity, atrial fibrillation, and pulmonary embolism presenting with acute on chronic hypoxemic hypercarbic respiratory failure that has improved after BiPAP.  Currently on home oxygen requirement of 4 L during the day  Acute on chronic Hypoxic/Hypercapneic RespiratoryFailure, improving Satting well on 5L, unclear why patient is requiring more than home O2 as there are no charted desaturations. I wean patient down to 4 L when I was in room and she stayed between 93 to 95% saturation.  On home3-4L at home.Pulmonologyrecommendations to continue qhs bipap as well as outpatient f/u with pulm.Patient to have outpatient sleep study, set up by pulm.  -Continuous pulse ox -nightly BiPAP, continue daily supplemental oxygen with goal of baseline of 3 to 4 L -Albuterol nebulizer every 2 hours as needed -Robitussin every 4 hours for cough as needed -DuoNebbid -Pulmonology consulted, appreciate recommendations  -Home health PT/OT, ordersin -patient to follow up with pulmonology in 1 week and they will also arrange sleep study  Headache Patient reports continued headache.  States tramadol has been helping.  When I arrived at room patient was satting in the upper 90s on 5 L.  I explained to her that using too much supplemental oxygen may be contributing to headache as well.  Wean her down to 4 L when I was in the room  and she satted between 93 to 95%.  Should continue to wean until stable at home 3 to 4 L. -continue tramadol PRN & tylenol PRN  Fever  Resolved. W/u negative so unclear why patient had fever.  -can consider w/u if worsening fever: CXR, blood and urine cx,& considerbroad spectrum abx   Small draining Abscess on Left Hip Stable - bacitracin - wound care per nursing  Chest Pressure, resolved  -Monitor for symptoms  Suspected OSA/OHS  -Home BiPAP ordered  Paroxysmal Atrial Fibrillation On Eliquis, also has history of PE. Rate is well controlled  -Continue dilt -Eliquis 5 mg twice daily  DM AM glucose103on BMP.No home diabetes medications on list. Last A1c 7.8 on 12/19.Likely 2/2 steroid use.  Has not required insulin in the past 2 days. -continue CBG checks 4 times daily -sSSI   Hx of PE  -Eliquis 5 mg twice daily  HFpEF UOP2.3L on 12/30, no charted output on 12/31 or 1/1. On exam, appears euvolemic .  -Diltiazem 120 mg nightly -Flecainide 100 mg twice daily -Lasix 80 mg daily -Metoprolol 12.5 twice daily  HTN Normotensive, BP 122/77.   -Patient on metoprolol 12.5 twice daily -monitor with vitals   MDD/ Anxiety  -Atarax 25 mg twice daily as needed -Effexor 150mg  daily   GERD  -Pepcid 20 mg daily -Protonix 40  Nausea QTC somewhat prolonged to 481on 12/25. -Compazine5mg q6h PRN  FEN/GI: Heart healthy PPx: Eliquis  Disposition: awaiting SNF, medically stable for dc  Subjective:  Patient states she feels well other than her headache.  Reports having continued headaches but tramadol helps with pain.  Did not take this morning yet.  Breathing is well  Objective: Temp:  [97.6 F (36.4 C)-98.5 F (36.9 C)] 98.5 F (36.9 C) (01/02 0753) Pulse Rate:  [90-106] 92 (01/02 0300) Resp:  [17-34] 17 (01/02 0753) BP: (107-142)/(71-84) 129/84 (01/02 0753) SpO2:  [96 %-98 %] 97 % (01/02 0753) Weight:  [169 kg] 169 kg (01/02  0500) Physical Exam: General: awake and alert, laying in bed with cold compress on her forehead  Cardiovascular: RRR, no MRG  Respiratory: minimal expiratory wheezing Abdomen: soft, non tender, non distended, bowel sounds present  Extremities: no edema   Laboratory: Recent Labs  Lab 06/27/19 0359 06/28/19 0426 06/29/19 0243  WBC 11.1* 9.1 9.6  HGB 9.9* 10.0* 9.8*  HCT 34.4* 34.5* 33.7*  PLT 522* 557* 509*   Recent Labs  Lab 06/27/19 0359 06/28/19 0426 06/29/19 0243  NA 140 140 137  K 4.6 3.9 4.1  CL 96* 95* 92*  CO2 35* 36* 32  BUN 8 7* 6*  CREATININE 0.66 0.57 0.48  CALCIUM 9.0 8.8* 8.6*  GLUCOSE 158* 127* 111*     Imaging/Diagnostic Tests: No new images   Caroline More, DO 06/29/2019, 9:52 AM PGY-3, [place] Intern pager: [phone], text pages welcome

## 2019-06-29 NOTE — TOC Progression Note (Signed)
Transition of Care [facility]) - Progression Note    Patient Details  Name: [name] White MRN: [id number] Date of Birth: 04/24/1956  Transition of Care [facility]) CM/SW [place], [place] Phone Number: 06/29/2019, 12:26 PM  Clinical Narrative:     Patient currently doesn't have any bed offers for SNF.   CSW will continue to follow and assist with discharge planning.   Expected Discharge Plan: [place] Barriers to Discharge: Continued Medical Work up  Expected Discharge Plan and Services Expected Discharge Plan: [place] In-house Referral: Clinical Social Work   Post Acute Care Choice: [place]   Expected Discharge Date: 06/27/19                                     Social Determinants of Health (SDOH) Interventions    Readmission Risk Interventions Readmission Risk Prevention Plan 03/08/2019  Transportation Screening Complete  Medication Review Press photographer) Complete  PCP or Specialist appointment within 3-5 days of discharge Complete  HRI or [place] Complete  SW Recovery Care/Counseling Consult Complete  [place] Not Applicable  Some recent data might be hidden

## 2019-06-29 NOTE — Progress Notes (Signed)
Pt refusing scheduled am meds until breakfast arrives and pt is not wanting to order breakfast until headache is relieved.  RN gave PRN Tramadol for headache this morning.  Will ask pt again to take morning meds & order breakfast.

## 2019-06-30 LAB — CBC
HCT: 33.5 % — ABNORMAL LOW (ref 36.0–46.0)
Hemoglobin: 9.6 g/dL — ABNORMAL LOW (ref 12.0–15.0)
MCH: 26.2 pg (ref 26.0–34.0)
MCHC: 28.7 g/dL — ABNORMAL LOW (ref 30.0–36.0)
MCV: 91.3 fL (ref 80.0–100.0)
Platelets: 453 10*3/uL — ABNORMAL HIGH (ref 150–400)
RBC: 3.67 MIL/uL — ABNORMAL LOW (ref 3.87–5.11)
RDW: 18.6 % — ABNORMAL HIGH (ref 11.5–15.5)
WBC: 9.4 10*3/uL (ref 4.0–10.5)
nRBC: 0 % (ref 0.0–0.2)

## 2019-06-30 LAB — BASIC METABOLIC PANEL
Anion gap: 7 (ref 5–15)
BUN: 8 mg/dL (ref 8–23)
CO2: 36 mmol/L — ABNORMAL HIGH (ref 22–32)
Calcium: 8.7 mg/dL — ABNORMAL LOW (ref 8.9–10.3)
Chloride: 97 mmol/L — ABNORMAL LOW (ref 98–111)
Creatinine, Ser: 0.57 mg/dL (ref 0.44–1.00)
GFR calc Af Amer: >60 mL/min (ref >60)
GFR calc non Af Amer: >60 mL/min (ref >60)
Glucose, Bld: 113 mg/dL — ABNORMAL HIGH (ref 70–99)
Potassium: 4.5 mmol/L (ref 3.5–5.1)
Sodium: 140 mmol/L (ref 135–145)

## 2019-06-30 LAB — GLUCOSE, CAPILLARY: Glucose-Capillary: 90 mg/dL (ref 70–99)

## 2019-06-30 NOTE — Progress Notes (Signed)
   06/30/19 2348  Vitals  Pulse Rate (!) 113  Resp (!) 23  MEWS Score  MEWS RR 1  MEWS Pulse 2  MEWS Systolic 0  MEWS LOC 0  MEWS Temp 0  MEWS Score 3  MEWS Score Color Yellow  MEWS Assessment  Is this an acute change? No  MEWS guidelines implemented *See Row Information* Yellow  Provider Notification  Date Provider Notified 06/30/19  Time Provider Notified [id number]  Notification Type Page  Notification Reason Change in status  Pt HR remains elevated after pt was placed on Bipap, RR 23. This causes MEWS score to increase to 3. Increased HR is acute change for this pt, provider on call notified. Pt uses accessory muscles to breath, but no distress noted at this time. Will continue to monitor pt.

## 2019-06-30 NOTE — Progress Notes (Signed)
Family Medicine Teaching Service Daily Progress Note Intern Pager: [phone]  Patient name: [name] White Medical record number: [id number] Date of birth: Nov 03, 1955 Age: 64 y.o. Gender: female  Primary Care Provider: [name], MD Consultants: Pulmonology  Code Status: DNR  Pt Overview and Major Events to Date:  06/21/19 - admitted, started on BIPAP 06/22/19 - Pulmonology consulted 06/27/19 - Medically stable for dc, awaiting SNF  Assessment and Plan: 64 year old with history of vocal cord dysfunction, HFpEF, OSA not on CPAP, chronic hypoxemic respiratory failure, obesity, atrial fibrillation, and pulmonary embolism presenting with acute on chronic hypoxemic hypercarbic respiratory failure that has improved after BiPAP.    Acute on chronic Hypoxic/Hypercapneic RespiratoryFailure, improving No complaints this am, doing well on qhs BiPAP. Charted 100% saturated on 5L though did dip into the 80s with talking this am. Home requirement is 3-4L O2. Still with very minimal end expiratory wheezing, tolerating breathing treatments well. Pulmonologyrecommendations to continue qhs bipap as well as outpatient f/u with pulm.Patient to have outpatient sleep study, set up by pulm.  -Continuous pulse ox -nightly BiPAP, continue daily supplemental oxygen with goal of baseline of 3 to 4 L -Albuterol nebulizer every 2 hours as needed -Robitussin every 4 hours for cough as needed -DuoNebbid -Pulmonology consulted, appreciate recommendations  -Home health PT/OT, ordersin -patient to follow up with pulmonology in 1 week and they will also arrange sleep study  Headache, improved Improved and currently without headache, reports resolved with prn tramadol, last required ~8pm last night. Some concern previously also with increased supplemental O2 use, saturated to high 80s on exam today so could have also contributed. -continue tramadol PRN & tylenol PRN  Fever, resolved  Last fever 12/30,  isolated event. -can consider w/u if worsening fever: CXR, blood and urine cx,& considerbroad spectrum abx   Small draining Abscess on Left Hip Stable - bacitracin - wound care per nursing  Chest Pressure, resolved  -Monitor for symptoms  Suspected OSA/OHS  -Home BiPAP ordered. Sleep study outpatient.  Paroxysmal Atrial Fibrillation On Eliquis, also has history of PE. Rate is well controlled, 80s.  -Continue dilt -Eliquis 5 mg twice daily  DM AM CBG 90.No home diabetes medications on list. Last A1c 7.8 on 12/19.Likely 2/2 steroid use.  Has not required insulin in the past 3 days. -d/c CBG monitoring and ssi -monitor with am BMP  Hx of PE  -Eliquis 5 mg twice daily  HFpEF UOP 454ml with 2 unmeasured UOP last 24 hours. On exam, appears euvolemic.  -Diltiazem 120 mg nightly -Flecainide 100 mg twice daily -Lasix 80 mg daily -Metoprolol 12.5 twice daily  HTN Normotensive this am. -continue home meds: metoprolol, lasix, diltiazem -monitor with vitals   MDD/ Anxiety  -Atarax 25 mg twice daily as needed -Effexor 150mg  daily   GERD  -Pepcid 20 mg daily -Protonix 40  Nausea QTC 428 12/30. -Compazine5mg q6h PRN  FEN/GI: Heart healthy PPx: Eliquis  Disposition: awaiting SNF, medically stable for dc  Subjective:  Doing well this am. Tolerating breathing treatments and BiPAP well.  Objective: Temp:  [97.9 F (36.6 C)-98.5 F (36.9 C)] 98 F (36.7 C) (01/03 0812) Pulse Rate:  [88-95] 88 (01/03 0816) Resp:  [16-22] 20 (01/03 0812) BP: (101-126)/(37-97) 113/56 (01/03 0816) SpO2:  [90 %-100 %] 100 % (01/03 0816) Physical Exam: General: morbidly obese female, in NAD Cardiovascular: regular rate  Respiratory: faint end expiratory wheezing. Normal WOB on 5L via [place], saturated at 88% in room Abdomen: obese abdomen, nonTTP. +BS  Extremities:  no edema  Laboratory: Recent Labs  Lab 06/28/19 0426 06/29/19 0243 06/30/19 0508  WBC 9.1 9.6  9.4  HGB 10.0* 9.8* 9.6*  HCT 34.5* 33.7* 33.5*  PLT 557* 509* 453*   Recent Labs  Lab 06/28/19 0426 06/29/19 0243 06/30/19 0508  NA 140 137 140  K 3.9 4.1 4.5  CL 95* 92* 97*  CO2 36* 32 36*  BUN 7* 6* 8  CREATININE 0.57 0.48 0.57  CALCIUM 8.8* 8.6* 8.7*  GLUCOSE 127* 111* 113*    Imaging/Diagnostic Tests: No results found.  Rory Percy, DO 06/30/2019, 8:26 AM PGY-3, [place] Intern pager: [phone], text pages welcome

## 2019-06-30 NOTE — Progress Notes (Signed)
Pt c/o SOB, expiratory wheezing auscultated in bilateral upper lobes.  PRN albuterol administered and pt verbalized improvement. SpO2 96% on 5L of O2 via nasal cannula. Pt not in distress at this time.  RN suggested to pt to put Bipap on, but pt refused, stating that she will need to eat something first. Per pt she gets upset stomach if she does not eat with medications.  RN educated pt that she will have to wait another 30 to 60 minutes after she eats to be placed on Bipap. Pt agreeable. RRT at the bedside. Will continue to monitor pt.

## 2019-06-30 NOTE — Progress Notes (Signed)
Pt did not want to go on Bipap at this time. Pt was eating and taking medictaion.

## 2019-06-30 NOTE — Plan of Care (Signed)

## 2019-07-01 ENCOUNTER — Inpatient Hospital Stay: Payer: Medicare HMO | Admitting: Family Medicine

## 2019-07-01 DIAGNOSIS — I5032 Chronic diastolic (congestive) heart failure: Secondary | ICD-10-CM

## 2019-07-01 LAB — BASIC METABOLIC PANEL
Anion gap: 6 (ref 5–15)
BUN: 6 mg/dL — ABNORMAL LOW (ref 8–23)
CO2: 37 mmol/L — ABNORMAL HIGH (ref 22–32)
Calcium: 8.8 mg/dL — ABNORMAL LOW (ref 8.9–10.3)
Chloride: 95 mmol/L — ABNORMAL LOW (ref 98–111)
Creatinine, Ser: 0.67 mg/dL (ref 0.44–1.00)
GFR calc Af Amer: >60 mL/min (ref >60)
GFR calc non Af Amer: >60 mL/min (ref >60)
Glucose, Bld: 147 mg/dL — ABNORMAL HIGH (ref 70–99)
Potassium: 4.2 mmol/L (ref 3.5–5.1)
Sodium: 138 mmol/L (ref 135–145)

## 2019-07-01 LAB — CBC
HCT: 33.2 % — ABNORMAL LOW (ref 36.0–46.0)
Hemoglobin: 9.6 g/dL — ABNORMAL LOW (ref 12.0–15.0)
MCH: 26.3 pg (ref 26.0–34.0)
MCHC: 28.9 g/dL — ABNORMAL LOW (ref 30.0–36.0)
MCV: 91 fL (ref 80.0–100.0)
Platelets: 471 10*3/uL — ABNORMAL HIGH (ref 150–400)
RBC: 3.65 MIL/uL — ABNORMAL LOW (ref 3.87–5.11)
RDW: 18.6 % — ABNORMAL HIGH (ref 11.5–15.5)
WBC: 8.6 10*3/uL (ref 4.0–10.5)
nRBC: 0 % (ref 0.0–0.2)

## 2019-07-01 MED ORDER — MOMETASONE FURO-FORMOTEROL FUM 100-5 MCG/ACT IN AERO
2.0000 | INHALATION_SPRAY | Freq: Two times a day (BID) | RESPIRATORY_TRACT | Status: DC
  Administered 2019-07-01 – 2019-07-07 (×13): 2 via RESPIRATORY_TRACT
  Filled 2019-07-01: qty 8.8

## 2019-07-01 MED ORDER — IPRATROPIUM-ALBUTEROL 0.5-2.5 (3) MG/3ML IN SOLN: 3.0000 mL | Freq: Two times a day (BID) | RESPIRATORY_TRACT | Status: DC | PRN

## 2019-07-01 MED ORDER — UMECLIDINIUM BROMIDE 62.5 MCG/INH IN AEPB
1.0000 | INHALATION_SPRAY | Freq: Every day | RESPIRATORY_TRACT | Status: DC
  Administered 2019-07-02 – 2019-07-07 (×6): 1 via RESPIRATORY_TRACT
  Filled 2019-07-01: qty 7

## 2019-07-01 NOTE — Progress Notes (Signed)
Physical Therapy Treatment Patient Details Name: [name] White MRN: [id number] DOB: 04-Apr-1956 Today's Date: 07/01/2019    History of Present Illness 64 y.o. female admitted on 06/21/19 for SOB.  Dx with acute on chronic hypoxic hypercapneic respiratory failure likely multifactorial due to chronic problems, suspected OHS/OSA.  Spiked fever 12/30, re-test COVID and respiratory panel. Pt with significant PMH of PE, PAF, OA, migraine, HTN, fall, COPD, chronic low back pain, acute on chronic respiratory failure wiht hypoxia.      PT Comments    Pt in bed upon PT arrival, agreeable to PT session focused on functional mobility and endurance training. The pt was able to demonstrate good bed mobility and transfers with good safety, but continues to be limited in functional endurance due to desaturation with mobility. The pt was further limited this morning as she had been on the bipap this am, and therefore was unable to eat before therapy, which made her feel more fatigued more quickly. The pt demos good stability with ambulation, but desat from 98% at rest on 4L to 77% ambulating on 6L. The pt becomes increasingly anxious with O2 desat, and will benefit from continued therapy to improve functional endurance as well as teach self-management techniques.     Follow Up Recommendations  SNF     Equipment Recommendations  None recommended by PT    Recommendations for Other Services       Precautions / Restrictions Precautions Precautions: Fall Precaution Comments: watch O2 stat, pt also anxious Restrictions Weight Bearing Restrictions: No    Mobility  Bed Mobility Overal bed mobility: Needs Assistance Bed Mobility: Supine to Sit;Sit to Supine     Supine to sit: Supervision Sit to supine: Supervision   General bed mobility comments: with elevated HOB and increased time  Transfers Overall transfer level: Needs assistance Equipment used: Rolling walker (2 wheeled) Transfers: Sit  to/from Stand Sit to Stand: Supervision Stand pivot transfers: Min assist       General transfer comment: assist for safety  Ambulation/Gait Ambulation/Gait assistance: Supervision Gait Distance (Feet): 20 Feet(20 ft in room, pt declined further ambulation at this time stating she wants to eat breakfast before walking more) Assistive device: Rolling walker (2 wheeled) Gait Pattern/deviations: Step-to pattern;Step-through pattern;Trunk flexed;Wide base of support Gait velocity: dec Gait velocity interpretation: <1.31 ft/sec, indicative of household ambulator General Gait Details: pt with use of RW and flexed posture through ambulation, increased O2 to 6L prior to ambulation as a precaution, pt able to maintain SpO2 >88 for first 10 ft, then dropped to 77%. Pt noteably anxious about SOB, reports feeling dizzy upon returning to recliner   Stairs             Wheelchair Mobility    Modified Rankin (Stroke Patients Only)       Balance Overall balance assessment: Needs assistance Sitting-balance support: Feet supported Sitting balance-Leahy Scale: Good Sitting balance - Comments: mod I   Standing balance support: Bilateral upper extremity supported Standing balance-Leahy Scale: Poor Standing balance comment: reliant on external support                            Cognition Arousal/Alertness: Awake/alert Behavior During Therapy: Anxious Overall Cognitive Status: Within Functional Limits for tasks assessed                                 General Comments: anxious with  SOB/DOE      Exercises      General Comments        Pertinent Vitals/Pain Pain Assessment: No/denies pain Pain Intervention(s): Limited activity within patient's tolerance;Monitored during session    Home Living                      Prior Function            PT Goals (current goals can now be found in the care plan section) Acute Rehab PT Goals Patient  Stated Goal: to feel better PT Goal Formulation: With patient Time For Goal Achievement: 07/07/19 Potential to Achieve Goals: Good Progress towards PT goals: Progressing toward goals    Frequency    Min 3X/week      PT Plan Current plan remains appropriate    Co-evaluation              AM-PAC PT "6 Clicks" Mobility   Outcome Measure  Help needed turning from your back to your side while in a flat bed without using bedrails?: A Little Help needed moving from lying on your back to sitting on the side of a flat bed without using bedrails?: A Little Help needed moving to and from a bed to a chair (including a wheelchair)?: None Help needed standing up from a chair using your arms (e.g., wheelchair or bedside chair)?: None Help needed to walk in hospital room?: A Little Help needed climbing 3-5 steps with a railing? : A Lot 6 Click Score: 19    End of Session Equipment Utilized During Treatment: Oxygen;Gait belt Activity Tolerance: Patient limited by fatigue Patient left: in chair;with call bell/phone within reach Nurse Communication: Mobility status PT Visit Diagnosis: Muscle weakness (generalized) (M62.81);Difficulty in walking, not elsewhere classified (R26.2)     Time: [id number] PT Time Calculation (min) (ACUTE ONLY): 38 min  Charges:  $Gait Training: 8-22 mins $Therapeutic Activity: 23-37 mins                     Karma Ganja, PT, DPT   Acute Rehabilitation Department [phone]   Otho Bellows 07/01/2019, 1:47 PM

## 2019-07-01 NOTE — TOC Progression Note (Addendum)
Transition of Care [facility]) - Progression Note    Patient Details  Name: [name] White MRN: [id number] Date of Birth: Jun 25, 1956  [facility]) CM/SW Contact  Pollie Friar, RN Phone Number: 07/01/2019, 2:30 PM  Clinical Narrative:    CM has reached out to all SNF rehabs that are pending. All but [place] responded with no availability. Awaiting response from the other 2.  CM has also extended the bed search. TOC following.   Expected Discharge Plan: [place] Barriers to Discharge: Continued Medical Work up  Expected Discharge Plan and Services Expected Discharge Plan: [place] In-house Referral: Clinical Social Work   Post Acute Care Choice: [place]   Expected Discharge Date: 06/27/19                                     Social Determinants of Health (SDOH) Interventions    Readmission Risk Interventions Readmission Risk Prevention Plan 03/08/2019  Transportation Screening Complete  Medication Review Press photographer) Complete  PCP or Specialist appointment within 3-5 days of discharge Complete  HRI or [place] Complete  SW Recovery Care/Counseling Consult Complete  [place] Not Applicable  Some recent data might be hidden

## 2019-07-01 NOTE — Progress Notes (Signed)
Family Medicine Teaching Service Daily Progress Note Intern Pager: [phone]  Patient name: [name] White Medical record number: [id number] Date of birth: Nov 25, 1955 Age: 64 y.o. Gender: female  Primary Care Provider: [name], MD Consultants: Pulmonology  Code Status: DNR  Pt Overview and Major Events to Date:  06/21/19 - admitted, started on BIPAP 06/22/19 - Pulmonology consulted 06/27/19 - Medically stable for dc, awaiting SNF  Assessment and Plan: [name] White is a 64 y.o. female with history of vocal cord dysfunction, HFpEF, OSA not on CPAP, chronic hypoxemic respiratory failure, obesity, atrial fibrillation, and pulmonary embolism presenting with acute on chronic hypoxemic hypercarbic respiratory failure that has improved after BiPAP.    Acute on chronic Hypoxic/Hypercapneic RespiratoryFailure, improving Satting well on BiPAP overnight and [name] 5L yesterday (93 to 100%).  Home requirement 3 to 4 L.  Will trial patient to home oxygen requirement today.  On exam, occasional basilar wheeze on the right.  Medically stable for discharge to SNF. -Continuous pulse ox -nightly BiPAP, to wean to home oxygen requirement 3-4 L -Albuterol nebulizer every 2 hours as needed -Robitussin every 4 hours for cough as needed -DuoNeb twice daily PRN -Start Dulera twice daily -Pulmonology consulted, appreciate recommendations  -patient to follow-up with pulmonology in 1 week, as well as for  sleep study  Headache Reports intermittent headaches that resolved with Tylenol and/or Tramadol  -continue Tylenol PRN and as Tramadol PRN   Fever, resolved  Last fever 12/30 at 8:30 AM, isolated event.  Small draining Abscess on Left Hip Stable - bacitracin - wound care per nursing  Paroxysmal Atrial Fibrillation On Eliquis, also has history of PE. Briefly tachycardic overnight to the 110s following albuterol treatment.  Otherwise heart rate well controlled. -Continue dilt -Eliquis 5  mg twice daily  DM No home diabetes medications on list. Last A1c 7.8 on 12/19.Likely 2/2 steroid use.  Has not required insulin in the past 3 days. -Discontinued sliding scale insulin -monitor with am BMP  Hx of PE  -Eliquis 5 mg twice daily  HFpEF Urinary output 1.1 L with 1 unmeasured occurrence yesterday.  Patient not down ~12.8 L since admission.  On exam, appears euvolemic. -Diltiazem 120 mg nightly -Flecainide 100 mg twice daily -Lasix 80 mg daily -Metoprolol 12.5 twice daily  HTN Today normotensive 127/60. -continue home meds: metoprolol, lasix, diltiazem -monitor with vitals   MDD/ Anxiety  -Atarax 25 mg twice daily as needed -Effexor 150mg  daily   GERD  -Pepcid 20 mg daily -Protonix 40  Nausea QTC 428 12/30. -Compazine5mg q6h PRN  Suspected OSA/OHS  -BiPAP ordered. Sleep study outpatient.  FEN/GI: Heart healthy diet, Protonix, Compazine PPx: Eliquis  Disposition: SNF placement   Subjective:  [name] White overnight was tachypneic and tachycardic following albuterol.  This morning, has no concerns.  Ports intermittent headaches with relief with current regimen.   Objective: Temp:  [97.5 F (36.4 C)-98.3 F (36.8 C)] 98.3 F (36.8 C) (01/03 2325) Pulse Rate:  [88-116] 91 (01/04 0500) Resp:  [18-23] 20 (01/04 0500) BP: (101-160)/(37-113) 127/60 (01/03 2325) SpO2:  [93 %-100 %] 97 % (01/04 0744) Weight:  [169.6 kg] 169.6 kg (01/04 0500)  Physical Exam:  GEN: Pleasant elderly female, resting in bed with BiPAP, in no acute distress CV: regular rate and rhythm, no murmurs appreciated  RESP: Right-sided basilar expiratory wheezes, no rhonchi, no rales, wearing BiPAP 5 L ABD:  Obese abdomen, bowel sounds present. Soft, Nontender, Nondistended.  MSK: no lower extremity edema, or cyanosis noted SKIN:  warm, dry NEURO: grossly normal, moves all extremities appropriately    Laboratory: Recent Labs  Lab 06/29/19 0243  06/30/19 0508 07/01/19 0445  WBC 9.6 9.4 8.6  HGB 9.8* 9.6* 9.6*  HCT 33.7* 33.5* 33.2*  PLT 509* 453* 471*   Recent Labs  Lab 06/29/19 0243 06/30/19 0508 07/01/19 0445  NA 137 140 138  K 4.1 4.5 4.2  CL 92* 97* 95*  CO2 32 36* 37*  BUN 6* 8 6*  CREATININE 0.48 0.57 0.67  CALCIUM 8.6* 8.7* 8.8*  GLUCOSE 111* 113* 147*    Imaging/Diagnostic Tests: No results found.  [name] Hensen, DO PGY-1, [facility] 07/01/2019 8:11 AM     FPTS Intern pager: [phone], text pages welcome

## 2019-07-01 NOTE — Progress Notes (Signed)
Provider on call, Chauncey Reading, MD called back and instructed RN to call back if pt becomes symptomatic with tachycardia, or if HR goes above 120.  MD also aware of previous SOB and chest tightness that improved with PRN albuterol.  Will continue to monitor pt.

## 2019-07-02 ENCOUNTER — Other Ambulatory Visit: Payer: Self-pay

## 2019-07-02 DIAGNOSIS — E1159 Type 2 diabetes mellitus with other circulatory complications: Secondary | ICD-10-CM

## 2019-07-02 DIAGNOSIS — I152 Hypertension secondary to endocrine disorders: Secondary | ICD-10-CM

## 2019-07-02 LAB — SARS CORONAVIRUS 2 (TAT 6-24 HRS): SARS Coronavirus 2: NEGATIVE

## 2019-07-02 MED ORDER — ACETAMINOPHEN 650 MG RE SUPP
650.0000 mg | Freq: Four times a day (QID) | RECTAL | Status: DC
  Filled 2019-07-02: qty 1

## 2019-07-02 MED ORDER — ACETAMINOPHEN 325 MG PO TABS
650.0000 mg | ORAL_TABLET | Freq: Four times a day (QID) | ORAL | Status: DC
  Administered 2019-07-02 – 2019-07-07 (×15): 650 mg via ORAL
  Filled 2019-07-02 (×18): qty 2

## 2019-07-02 MED ORDER — TRAMADOL HCL 50 MG PO TABS
50.0000 mg | ORAL_TABLET | Freq: Two times a day (BID) | ORAL | Status: DC | PRN
  Administered 2019-07-03 – 2019-07-05 (×3): 50 mg via ORAL
  Filled 2019-07-02 (×3): qty 1

## 2019-07-02 NOTE — TOC Progression Note (Signed)
Transition of Care [facility]) - Progression Note    Patient Details  Name: [name] White MRN: [id number] Date of Birth: 05-Jun-1956  Transition of Care [facility]) CM/SW Contact  Pollie Friar, RN Phone Number: 07/02/2019, 2:00 PM  Clinical Narrative:    Patient selected [place]. Chris with [facility] states they currently dont have any beds. MD updated. CM following.   Expected Discharge Plan: [place] Barriers to Discharge: Continued Medical Work up  Expected Discharge Plan and Services Expected Discharge Plan: [place] In-house Referral: Clinical Social Work   Post Acute Care Choice: [place]   Expected Discharge Date: 06/27/19                                     Social Determinants of Health (SDOH) Interventions    Readmission Risk Interventions Readmission Risk Prevention Plan 03/08/2019  Transportation Screening Complete  Medication Review Press photographer) Complete  PCP or Specialist appointment within 3-5 days of discharge Complete  HRI or [place] Complete  SW Recovery Care/Counseling Consult Complete  [place] Not Applicable  Some recent data might be hidden

## 2019-07-02 NOTE — Progress Notes (Signed)
The patient was having respiratory distress triggered from nowhere. RT in the room as well as this Probation officer. Albuterol administered. V/S were stable as noted in epic.  Patient put on her BIPAP and she verbalized relief. Dr. Posey Pronto  notified of the incident. Will continue to monitor.

## 2019-07-02 NOTE — Progress Notes (Addendum)
Family Medicine Teaching Service Daily Progress Note Intern Pager: [phone]  Patient name: [name] White Medical record number: [id number] Date of birth: 1955-10-06 Age: 64 y.o. Gender: female  Primary Care Provider: [name], MD Consultants: Pulmonology  Code Status: DNR  Pt Overview and Major Events to Date:  06/21/19 - admitted, started on BIPAP 06/22/19 - Pulmonology consulted 06/27/19 - Medically stable for dc, awaiting SNF  Assessment and Plan: [name] White is a 64 y.o. female with history of vocal cord dysfunction, HFpEF, OSA not on CPAP, chronic hypoxemic respiratory failure, obesity, atrial fibrillation, and pulmonary embolism presenting with acute on chronic hypoxemic hypercarbic respiratory failure that has improved after BiPAP.    Acute on chronic Hypoxic/Hypercapneic RespiratoryFailure, improving Satting well on CPAP machine with BiPAP settings overnight 5L. Weaned to home 3-4L [place] throughout the day. Patient reports she loves the CPAP machine however it has BiPAP settings.. On exam, lungs clear to auscultation bilaterally.   Used Albuterol Nebs 2x yesterday.  Patient desatted with ambulation to the 70s with PT yesterday however at rest was at home oxygen requirement and satting well. Medically stable for discharge. -Continuous pulse ox -nightly BiPAP -Albuterol nebulizers every 2 hours as needed -Continue Dulera twice daily, Incruse daily  -Pulmonology consulted, appreciate recommendations  -patient to follow-up with pulmonology in 1 week, as well as for  sleep study  Headache Continues to report intermittent headaches. -Scheduled Tylenol PRN  - Tramadol PRN   Fever, resolved  Last fever 12/30 at 8:30 AM, isolated event.  Small draining Abscess on Left Hip - bacitracin - wound care per nursing  Paroxysmal Atrial Fibrillation On Eliquis, also has history of PE.  Heart rate well controlled heart rate yesterday ranged 85-92. -Continue dilt  -Eliquis 5 mg twice daily  DM No home diabetes medications on list. Last A1c 7.8 on 12/19.Likely 2/2 steroid use.    No insulin requirement overnight. -Discontinued sliding scale insulin -monitor with am BMP  Hx of PE  -Eliquis 5 mg twice daily  HFpEF Urinary output 2.5 L with 1 unmeasured occurrence yesterday.  Patient not down ~15 L since admission.  On exam, appears euvolemic. -Diltiazem 120 mg nightly -Flecainide 100 mg twice daily -Lasix 80 mg daily -Metoprolol 12.5 twice daily  HTN Today normotensive 130/59.  -continue home meds: metoprolol, lasix, diltiazem -monitor with vitals   MDD/ Anxiety  -Atarax 25 mg twice daily as needed -Effexor 150mg  daily   GERD  -Pepcid 20 mg daily -Protonix 40  Nausea QTC 428 12/30. -Compazine5mg q6h PRN  Suspected OSA/OHS  -BiPAP ordered. Sleep study outpatient.  FEN/GI: Heart healthy diet, Protonix, Compazine PPx: Eliquis  Disposition: SNF placement   Subjective:  [name] White significant overnight events.  Patient tolerated weaning to home requirement.  Objective: Temp:  [98.2 F (36.8 C)-98.7 F (37.1 C)] 98.2 F (36.8 C) (01/05 0931) Pulse Rate:  [84-92] 84 (01/05 0931) Resp:  [18-21] 18 (01/05 0931) BP: (109-137)/(57-70) 130/59 (01/05 0931) SpO2:  [94 %-98 %] 98 % (01/05 0931)  Physical Exam:  GEN: pleasant elderly female in no acute distress CV: regular rate and rhythm, no murmurs appreciated, distal pulses intact RESP: no increased work of breathing, clear to ascultation bilaterally with no crackles, wheezes, or rhonchi appreciated, CPAP with BiPAP settings in place ABD: Bowel sounds present. Soft, Nontender, non-distended, obese abdomen MSK: no lower extremity edema initiated, or cyanosis noted SKIN: warm, dry NEURO: grossly normal, moves all extremities appropriately     Laboratory: Recent Labs  Lab 06/29/19  [id number] 06/30/19 0508 07/01/19 0445  WBC 9.6 9.4 8.6  HGB 9.8* 9.6*  9.6*  HCT 33.7* 33.5* 33.2*  PLT 509* 453* 471*   Recent Labs  Lab 06/29/19 0243 06/30/19 0508 07/01/19 0445  NA 137 140 138  K 4.1 4.5 4.2  CL 92* 97* 95*  CO2 32 36* 37*  BUN 6* 8 6*  CREATININE 0.48 0.57 0.67  CALCIUM 8.6* 8.7* 8.8*  GLUCOSE 111* 113* 147*    Imaging/Diagnostic Tests: No results found.  Lyndee Hensen, DO PGY-1, [facility] 07/02/2019 9:42 AM     FPTS Intern pager: [phone], text pages welcome

## 2019-07-02 NOTE — Progress Notes (Signed)
RT NOTES: Patient wearing bipap at night with auto titrate setting and 4-5lpm oxygen.

## 2019-07-02 NOTE — Telephone Encounter (Signed)
Patient is still admitted. Once patient is discharged, will make appt.

## 2019-07-02 NOTE — Care Management Important Message (Signed)
Important Message  Patient Details  Name: [name] White MRN: [id number] Date of Birth: [birth year]   Medicare Important Message Given:  Yes     Orbie Pyo 07/02/2019, 3:16 PM

## 2019-07-03 DIAGNOSIS — J449 Chronic obstructive pulmonary disease, unspecified: Secondary | ICD-10-CM

## 2019-07-03 DIAGNOSIS — E1159 Type 2 diabetes mellitus with other circulatory complications: Secondary | ICD-10-CM

## 2019-07-03 DIAGNOSIS — I10 Essential (primary) hypertension: Secondary | ICD-10-CM

## 2019-07-03 DIAGNOSIS — R519 Headache, unspecified: Secondary | ICD-10-CM

## 2019-07-03 LAB — GLUCOSE, CAPILLARY
Glucose-Capillary: 132 mg/dL — ABNORMAL HIGH (ref 70–99)
Glucose-Capillary: 154 mg/dL — ABNORMAL HIGH (ref 70–99)
Glucose-Capillary: 111 mg/dL — ABNORMAL HIGH (ref 70–99)
Glucose-Capillary: 122 mg/dL — ABNORMAL HIGH (ref 70–99)
Glucose-Capillary: 128 mg/dL — ABNORMAL HIGH (ref 70–99)
Glucose-Capillary: 156 mg/dL — ABNORMAL HIGH (ref 70–99)
Glucose-Capillary: 82 mg/dL (ref 70–99)
Glucose-Capillary: 157 mg/dL — ABNORMAL HIGH (ref 70–99)
Glucose-Capillary: 105 mg/dL — ABNORMAL HIGH (ref 70–99)

## 2019-07-03 MED ORDER — IPRATROPIUM-ALBUTEROL 0.5-2.5 (3) MG/3ML IN SOLN
3.0000 mL | Freq: Three times a day (TID) | RESPIRATORY_TRACT | Status: DC
  Administered 2019-07-03 – 2019-07-04 (×2): 3 mL via RESPIRATORY_TRACT
  Filled 2019-07-03 (×2): qty 3

## 2019-07-03 NOTE — Progress Notes (Signed)
PT Cancellation Note  Patient Details Name: [name] White MRN: [id number] DOB: 01-29-1956   Cancelled Treatment:    Reason Eval/Treat Not Completed: Other (comment) Pt up on [facility] and requested PT come another time.  Will check back as schedule permits.   Galen Manila 07/03/2019, 1:55 PM

## 2019-07-03 NOTE — Progress Notes (Signed)
Family Medicine Teaching Service Daily Progress Note Intern Pager: [phone]  Patient name: [name] White Medical record number: [id number] Date of birth: 05-07-1956 Age: 64 y.o. Gender: female  Primary Care Provider: [name], MD Consultants: Pulmonology  Code Status: DNR  Pt Overview and Major Events to Date:  06/21/19 - admitted, started on BIPAP 06/22/19 - Pulmonology consulted 06/27/19 - Medically stable for dc, awaiting SNF  Assessment and Plan: [name] White is a 64 y.o. female with history of vocal cord dysfunction, HFpEF, OSA not on CPAP, chronic hypoxemic respiratory failure, obesity, atrial fibrillation, and pulmonary embolism presenting with acute on chronic hypoxemic hypercarbic respiratory failure that has improved after BiPAP.    Acute on chronic Hypoxic/Hypercapneic RespiratoryFailure, improving Satting well on home oxygen requirement.  Patient had two bed requests but lost bed due to pt having delayed SNF choice decision. Will continue to work with SW for bed placement.  Continues to be medically stable for discharge. Required PRN x3 Albuterol  in past 24h. Continues to use CPAP with auto BiPAP settings.  continuous pulse ox  -nightly BiPAP -Albuterol nebs q2h PRN -Dulera BID  -Incruse qd  -Pulmonology consulted, appreciate recommendations  -patient to follow-up with pulmonology in 1 week, as well as for  sleep study  Headache - Scheduled Tylenol - Tramadol PRN   Paroxysmal Atrial Fibrillation On Eliquis, also has history of PE.  Heart rate well controlled heart rate yesterday ranged 85-92. -Continue dilt -Eliquis 5 mg twice daily  DM No home diabetes medications on list. Last A1c 7.8 on 12/19.Likely 2/2 steroid use.    No insulin requirement overnight. -Discontinued sliding scale insulin - Weekly BMP  Hx of PE  -Eliquis 5 mg twice daily  HFpEF Urinary output ~1.5 L with 1 unmeasured occurrence yesterday.  Patient not down ~16.5  L since admission.  On exam, appear euvolemic on exam. -Diltiazem 120 mg nightly -Flecainide 100 mg twice daily -Lasix 80 mg daily -Metoprolol 12.5 twice daily  HTN Today BP 120/59.  -continue home meds: metoprolol, lasix, diltiazem -monitor with vitals   MDD/ Anxiety  -Atarax 25 mg twice daily as needed -Effexor 150mg  daily   GERD  -Pepcid 20 mg daily -Protonix 40  Nausea QTC 428 12/30. -Compazine5mg q6h PRN  Suspected OSA/OHS  -BiPAP ordered. Sleep study outpatient.  FEN/GI: Heart healthy diet, Protonix, Compazine PPx: Eliquis  Disposition: Medically stable for discharge   Subjective:  [name] White had an episode of respiratory distress while RT was in the room.  No vital sign changes noted.     Objective: Temp:  [97.5 F (36.4 C)-98.7 F (37.1 C)] 97.5 F (36.4 C) (01/06 0827) Pulse Rate:  [83-108] 83 (01/06 0827) Resp:  [18-22] 20 (01/06 0827) BP: (98-157)/(59-83) 120/59 (01/06 0827) SpO2:  [92 %-99 %] 98 % (01/06 0827) Weight:  [184.5 kg] 184.5 kg (01/06 [id number])  Physical Exam:  GEN: pleasant elderly female, in no acute distress CV: regular rate and rhythm, no murmurs appreciated, distal pulses intact  RESP: no increased work of breathing, mild expiratory wheezes ABD:  obese abdomen, bowel sounds present. soft, nontender, nondistended.  MSK: no edema, or cyanosis noted SKIN: warm, dry NEURO: grossly normal, moves all extremities appropriately PSYCH: Normal affect and thought content     Laboratory: Recent Labs  Lab 06/29/19 0243 06/30/19 0508 07/01/19 0445  WBC 9.6 9.4 8.6  HGB 9.8* 9.6* 9.6*  HCT 33.7* 33.5* 33.2*  PLT 509* 453* 471*   Recent Labs  Lab 06/29/19 0243 06/30/19  [id number] 07/01/19 0445  NA 137 140 138  K 4.1 4.5 4.2  CL 92* 97* 95*  CO2 32 36* 37*  BUN 6* 8 6*  CREATININE 0.48 0.57 0.67  CALCIUM 8.6* 8.7* 8.8*  GLUCOSE 111* 113* 147*    Imaging/Diagnostic Tests: No results found.  Lyndee Hensen,  DO PGY-1, [facility] 07/03/2019 8:31 AM    FPTS Intern pager: [phone], text pages welcome

## 2019-07-03 NOTE — Progress Notes (Signed)
Attempted to give patient inhalers. Patient still on CPAP machine and wished to hold off on medications at this time. Stated she would call when she was ready.

## 2019-07-04 MED ORDER — TOPIRAMATE 25 MG PO TABS
25.0000 mg | ORAL_TABLET | Freq: Every day | ORAL | Status: DC
  Administered 2019-07-04 – 2019-07-07 (×4): 25 mg via ORAL
  Filled 2019-07-04 (×4): qty 1

## 2019-07-04 NOTE — Consult Note (Signed)
   [facility] CM Inpatient Consult   07/04/2019  [name] White [birth year] [id number]     Update Note:  Following disposition of this [facility] Medicare patient with extreme high risk score for unplanned readmission and hospitalizations, who has been outreached by [facility] practice RN CM for care coordination.  Per transition of care team and PT/ OT notes reveal that current disposition is for skilled nursing facility (SNF) to maximize strength and endurance prior to returning home. Awaiting for SNF bed availability.  Spoke with patient briefly over the phone. HIPAA verified. Patient reports that "they are working on my discharge to a facility".  Patient made aware and agreed to follow-up phone calls from [facility] CM post discharge. She verified best contact number as [phone] (Mobile).  Will update [facility] Embedded RN CM of patient's disposition for follow-up.   For questions and additional information, please call:  Lenix Kidd A. [name], BSN, RN-BC [facility] Liaison Cell: [phone]

## 2019-07-04 NOTE — Progress Notes (Addendum)
2100: Pt is always encouraged to do as much as she can and be as independent as possible. Pt requested staff to turn light off on her call bell - pt has been observed many times with call bell in hand, using call bell, changing tv stations, and adjusting volume.   2251: Pt given atarax - pt used call bell and stated that her heart was beating too fast, was breathing heavily, restless, visibly anxious stating many worries.   2359: Pt on CPAP, resting comfortably, no visible distress

## 2019-07-04 NOTE — Progress Notes (Signed)
Pt c/o s.o.b. RT requested.

## 2019-07-04 NOTE — Progress Notes (Signed)
Family Medicine Teaching Service Daily Progress Note Intern Pager: [phone]  Patient name: [name] White Medical record number: [id number] Date of birth: [birth year] Age: 64 y.o. Gender: female  Primary Care Provider: [name], MD Consultants: Pulmonology  Code Status: DNR  Pt Overview and Major Events to Date:  06/21/19 - admitted, started on BIPAP 06/22/19 - Pulmonology consulted 06/27/19 - Medically stable for dc, awaiting SNF  Assessment and Plan: [name] White is a 64 y.o. female with history of vocal cord dysfunction, HFpEF, OSA not on CPAP, chronic hypoxemic respiratory failure, obesity, atrial fibrillation, and pulmonary embolism presenting with acute on chronic hypoxemic hypercarbic respiratory failure that has improved after BiPAP.    Acute on chronic Hypoxic/Hypercapneic RespiratoryFailure, improving Satting well on 5 L. Will ask nursing staff to wean back down to home oxygen requirement.  Patient continues to use CPAP with BiPAP settings overnight.  Appreciate social work up with obtaining new bed placement.  Patient continues to be medically stable for discharge.  -Tenuous pulse ox -Nightly BiPAP -Dulera twice daily -Incruse daily -Albuterol nebs every 2 hours as needed -Discontinued scheduled DuoNeb's as patient on Incruse -Pulmonology consulted, appreciate recommendations -Patient to follow-up with pulmonology in 1 week following discharge for sleep study  Headache Patient reporting neck pain this morning.  Has history of migraine headaches.  Likely mixed type.  Will start migraine prophylaxis Topamax 25 mg and titrate up from there. - Scheduled Tylenol - Topamax 25 mg x 1 week, titrate up - Tramadol PRN   Paroxysmal Atrial Fibrillation On Eliquis, also has history of PE.  Heart rate well controlled heart rate yesterday ranged 83 -101. -Continue dilt -Eliquis 5 mg twice daily  DM No home diabetes medications on list. Last A1c 7.8 on  12/19.Likely 2/2 steroid use.    No insulin requirement overnight. -Discontinued sliding scale insulin - Weekly BMP: next 1/11   Hx of PE  -Eliquis 5 mg twice daily  HFpEF Urinary output .8 L yesterday.  Patient not down ~17.3 L since admission.  On exam, appear euvolemic on exam. -Diltiazem 120 mg nightly -Flecainide 100 mg twice daily -Lasix 80 mg daily -Metoprolol 12.5 twice daily  HTN Today BP 120/59.  -continue home meds: metoprolol, lasix, diltiazem -monitor with vitals   MDD/ Anxiety  -Atarax 25 mg twice daily as needed -Effexor 150mg  daily   GERD  -Pepcid 20 mg daily -Protonix 40  Nausea QTC 428 12/30. -Compazine5mg q6h PRN  Suspected OSA/OHS  -BiPAP ordered. Sleep study outpatient.  FEN/GI: Heart healthy diet, Protonix, Compazine PPx: Eliquis  Disposition: Medically stable for discharge   Subjective:  [name] White complains of her chronic neck pain and intermittent headaches this morning.   Objective: Temp:  [97.9 F (36.6 C)-98.5 F (36.9 C)] 98.5 F (36.9 C) (01/07 0725) Pulse Rate:  [84-101] 84 (01/07 0725) Resp:  [20] 20 (01/07 0026) BP: (109-132)/(61-81) 109/69 (01/07 0725) SpO2:  [93 %-99 %] 93 % (01/07 0725) FiO2 (%):  [98 %] 98 % (01/06 1104) Weight:  [199.3 kg] 199.3 kg (01/07 0425)  Physical Exam:  GEN: well developed pleasant female in no acute distress CV: regular rate and rhythm, no murmurs appreciated RESP: no increased work of breathing, clear to ascultation bilaterally with no crackles, wheezes, or rhonchi, wearing CPAP with auto BiPAP setting at 5 L  ABD: Bowel sounds present. soft, nontender, nondistended. MSK: no lower extremity edema appreciated or cyanosis noted SKIN: warm, dry NEURO: grossly normal, moves all extremities appropriately PSYCH: Normal affect and  thought content      Laboratory: Recent Labs  Lab 06/29/19 0243 06/30/19 0508 07/01/19 0445  WBC 9.6 9.4 8.6  HGB 9.8* 9.6* 9.6*  HCT  33.7* 33.5* 33.2*  PLT 509* 453* 471*   Recent Labs  Lab 06/29/19 0243 06/30/19 0508 07/01/19 0445  NA 137 140 138  K 4.1 4.5 4.2  CL 92* 97* 95*  CO2 32 36* 37*  BUN 6* 8 6*  CREATININE 0.48 0.57 0.67  CALCIUM 8.6* 8.7* 8.8*  GLUCOSE 111* 113* 147*    Imaging/Diagnostic Tests: No results found.  Lyndee Hensen, DO PGY-1, [facility] 07/04/2019 9:59 AM    FPTS Intern pager: [phone], text pages welcome

## 2019-07-04 NOTE — Progress Notes (Signed)
Occupational Therapy Treatment Patient Details Name: [name] White MRN: [id number] DOB: 15-Feb-1956 Today's Date: 07/04/2019    History of present illness 64 y.o. female admitted on 06/21/19 for SOB.  Dx with acute on chronic hypoxic hypercapneic respiratory failure likely multifactorial due to chronic problems, suspected OHS/OSA.  Spiked fever 12/30, re-test COVID and respiratory panel. Pt with significant PMH of PE, PAF, OA, migraine, HTN, fall, COPD, chronic low back pain, acute on chronic respiratory failure wiht hypoxia.     OT comments  Patient supine in bed and agreeable to OT.  Progressing slowly towards OT goals, limited by decreased activity tolerance and endurance. Pt anxious with mobility, on 5L [place] with SpO2 desaturation to 85% during activity but rebounding quickly during rest with cueing for PLB. She requires supervision for transfers and min guard for in room mobility using RW.  Encouraged OOB 3x daily for meals and patient agreeable. Updated dc plan to SNF to maximize strength, endurance and return to PLOF before dc home.    Follow Up Recommendations  SNF;Supervision/Assistance - 24 hour    Equipment Recommendations  Other (comment)(TBD at next venue of care)    Recommendations for Other Services      Precautions / Restrictions Precautions Precautions: Fall Precaution Comments: watch O2 stat, pt also anxious Restrictions Weight Bearing Restrictions: No       Mobility Bed Mobility Overal bed mobility: Needs Assistance Bed Mobility: Supine to Sit     Supine to sit: Supervision     General bed mobility comments: elevated HOB, increased time and effort, but no physical assist required  Transfers Overall transfer level: Needs assistance Equipment used: Rolling walker (2 wheeled) Transfers: Sit to/from Stand Sit to Stand: Supervision         General transfer comment: assist for safety    Balance Overall balance assessment: Needs  assistance Sitting-balance support: Feet supported Sitting balance-Leahy Scale: Good Sitting balance - Comments: mod I   Standing balance support: Bilateral upper extremity supported Standing balance-Leahy Scale: Poor Standing balance comment: reliant on external support                           ADL either performed or assessed with clinical judgement   ADL Overall ADL's : Needs assistance/impaired     Grooming: Set up;Sitting                   Toilet Transfer: Ambulation;RW;Supervision/safety Toilet Transfer Details (indicate cue type and reason): simulated to recliner          Functional mobility during ADLs: Min guard;Rolling walker General ADL Comments: patient limited by decreased actiivty tolerance and endurance     Vision       Perception     Praxis      Cognition Arousal/Alertness: Awake/alert Behavior During Therapy: Anxious Overall Cognitive Status: Within Functional Limits for tasks assessed                                 General Comments: anxious with SOB/DOE        Exercises     Shoulder Instructions       General Comments pt on 5L O2 during sesion, requires cueing for PLB throughout session, SpO2 desat to 85% during mobility/transfers but rebounds quickly    Pertinent Vitals/ Pain       Pain Assessment: No/denies pain  Home Living  Prior Functioning/Environment              Frequency  Min 2X/week        Progress Toward Goals  OT Goals(current goals can now be found in the care plan section)  Progress towards OT goals: Progressing toward goals  Acute Rehab OT Goals Patient Stated Goal: to feel better OT Goal Formulation: With patient  Plan Discharge plan needs to be updated;Frequency remains appropriate    Co-evaluation                 AM-PAC OT "6 Clicks" Daily Activity     Outcome Measure   Help from another person  eating meals?: None Help from another person taking care of personal grooming?: A Little Help from another person toileting, which includes using toliet, bedpan, or urinal?: A Little Help from another person bathing (including washing, rinsing, drying)?: A Little Help from another person to put on and taking off regular upper body clothing?: A Little Help from another person to put on and taking off regular lower body clothing?: A Little 6 Click Score: 19    End of Session Equipment Utilized During Treatment: Oxygen(5L)  OT Visit Diagnosis: Unsteadiness on feet (R26.81);Muscle weakness (generalized) (M62.81)   Activity Tolerance Patient tolerated treatment well   Patient Left with call bell/phone within reach;in bed;with SCD's reapplied(RT in room)   Nurse Communication Mobility status        Time: [id number] OT Time Calculation (min): 17 min  Charges: OT General Charges $OT Visit: 1 Visit OT Treatments $Self Care/Home Management : 8-22 mins  Jolaine Artist, OT Acute Rehabilitation Services Pager [phone] Office [phone]    Delight Stare 07/04/2019, 12:18 PM

## 2019-07-04 NOTE — Progress Notes (Signed)
Physical Therapy Cancellation    07/04/19 1045  PT Visit Information  Last PT Received On 07/04/19  Reason Eval/Treat Not Completed Patient at procedure or test/unavailable   Patient recently (~15 minutes ago) completed working with OT. Will attempt to see later today as schedule permits.    Arby Barrette, PT Pager [phone]

## 2019-07-05 MED ORDER — IPRATROPIUM-ALBUTEROL 0.5-2.5 (3) MG/3ML IN SOLN
3.0000 mL | Freq: Two times a day (BID) | RESPIRATORY_TRACT | Status: DC | PRN
  Administered 2019-07-05 – 2019-07-07 (×5): 3 mL via RESPIRATORY_TRACT
  Filled 2019-07-05 (×4): qty 3

## 2019-07-05 NOTE — Progress Notes (Signed)
Physical Therapy Treatment Patient Details Name: [name] White MRN: [id number] DOB: March 15, 1956 Today's Date: 07/05/2019    History of Present Illness 64 y.o. female admitted on 06/21/19 for SOB.  Dx with acute on chronic hypoxic hypercapneic respiratory failure likely multifactorial due to chronic problems, suspected OHS/OSA.  Spiked fever 12/30, re-test COVID and respiratory panel. PMH of PE, PAF, OA, migraine, HTN, fall, COPD, chronic low back pain.   PT Comments    Pt preparing for potential d/c home today. Declining OOB mobility but agreeable to LE therex and education re: importance of mobility, activity recommendations (aerobic, strengthening), daily SpO2 monitoring, energy conservation, safety at home. Pt reports owning necessary DME (w/c to be delivered to home) and will have assist from niece. If to remain admitted, will continue to follow acutely.  SpO2 94% on 3L O2 at rest   Follow Up Recommendations  Home health PT;Supervision for mobility/OOB     Equipment Recommendations  Wheelchair (measurements PT);Wheelchair cushion (measurements PT)    Recommendations for Other Services       Precautions / Restrictions Precautions Precautions: Fall Restrictions Weight Bearing Restrictions: No    Mobility  Bed Mobility               General bed mobility comments: Pt declining reposition in bed to encourage more upright posture, declining OOB despite max encouragement - "the staff will get me up later... I just got my medicine... I was sick this morning"  Transfers                    Ambulation/Gait                 Stairs             Wheelchair Mobility    Modified Rankin (Stroke Patients Only)       Balance                                            Cognition Arousal/Alertness: Awake/alert Behavior During Therapy: WFL for tasks assessed/performed Overall Cognitive Status: Within Functional Limits for tasks  assessed                                        Exercises Other Exercises Other Exercises: HEP handout provided and reviewed (Medbridge Access Code: [email or web address]) - SLR, heel slides, bridges, seated LAQ, partial squats with counter support    General Comments General comments (skin integrity, edema, etc.): SpO2 94% on 3L O2 [place]. Pt only agreeable for supine LE therex, HEP handout provided and educ on progression, including strengthening and aerobic activity. Pt planning to return home with support from niece; owns necessary DME except w/c which pt reports is supposed to be delivered; pt plans to continue with HHPT. Educ on daily monitoring of O2 sats, info provided on purchasing pulse ox      Pertinent Vitals/Pain Pain Assessment: No/denies pain    Home Living                      Prior Function            PT Goals (current goals can now be found in the care plan section) Acute Rehab PT Goals Patient Stated Goal: Go home PT Goal Formulation: With  patient Time For Goal Achievement: 07/19/19 Potential to Achieve Goals: Good Progress towards PT goals: Not progressing toward goals - comment(declining OOB)    Frequency    Min 3X/week      PT Plan Discharge plan needs to be updated    Co-evaluation              AM-PAC PT "6 Clicks" Mobility   Outcome Measure  Help needed turning from your back to your side while in a flat bed without using bedrails?: A Little Help needed moving from lying on your back to sitting on the side of a flat bed without using bedrails?: A Little Help needed moving to and from a bed to a chair (including a wheelchair)?: A Little Help needed standing up from a chair using your arms (e.g., wheelchair or bedside chair)?: A Little Help needed to walk in hospital room?: A Little Help needed climbing 3-5 steps with a railing? : A Lot 6 Click Score: 17    End of Session Equipment Utilized During Treatment: Oxygen Activity  Tolerance: Patient limited by fatigue Patient left: in bed;with call bell/phone within reach Nurse Communication: Mobility status PT Visit Diagnosis: Muscle weakness (generalized) (M62.81);Difficulty in walking, not elsewhere classified (R26.2)     Time: [id number] PT Time Calculation (min) (ACUTE ONLY): 15 min  Charges:  $Self Care/Home Management: [place], PT, DPT Acute Rehabilitation Services  Pager [phone] Office [phone]  Derry Lory 07/05/2019, 10:45 AM

## 2019-07-05 NOTE — Progress Notes (Addendum)
FPTS Interim Progress Note  Late entry   Patient reported that she does not have CPAP/BiPAP at home. Patient receiving another PRN Duoneb treatment for increased SOB.  Per RT, patient to sit up as much as possible throughout the day. Per Dr. Megan Salon note on 06/24/19, patient needs sleep study. In speaking with Thedore Mins with [place] liaison patient has sleep study waiting for her at home. She will need scheduled follow up with her pulmonologist ~1 week after discharge. Consider redose of IV steroids if requiring multiple PRNs. Will trial patient with 4L tonight to not inhibit patient's respiratory drive.  Hold CPAP/BiPAP tonight.   Lyndee Hensen, DO PGY-1, [facility] 07/05/2019 5:12 PM   Service pager [phone]

## 2019-07-05 NOTE — Progress Notes (Signed)
Family Medicine Teaching Service Daily Progress Note Intern Pager: [phone]  Patient name: [name] White Medical record number: [id number] Date of birth: July 30, 1955 Age: 64 y.o. Gender: female  Primary Care Provider: [name], MD Consultants: Pulmonology  Code Status: DNR  Pt Overview and Major Events to Date:  06/21/19 - admitted, started on BIPAP 06/22/19 - Pulmonology consulted 06/27/19 - Medically stable for dc, awaiting SNF  Assessment and Plan: [name] White is a 64 y.o. female with history of vocal cord dysfunction, HFpEF, OSA not on CPAP, chronic hypoxemic respiratory failure, obesity, atrial fibrillation, and pulmonary embolism presenting with acute on chronic hypoxemic hypercarbic respiratory failure that has improved after BiPAP.    Acute on chronic Hypoxic/Hypercapneic RespiratoryFailure, improving RT present as patient was in distress this morning.  Receiving albuterol treatment.  Continues to use CPAP with auto BiPAP settings.  Social work working diligently to obtain bed placement.  Patient continues to be medically stable for discharge. -Continuous pulse oximetry -Nightly BiPAP -Dulera twice daily -Incruse daily -DuoNebs as needed -Albuterol every 2 hours as needed -Pulmonology consulted, appreciated recommendations: Patient to follow-up in 1 week for sleep study   Headache Started migraine prophylaxis, Topamax, yesterday.  As needed tramadol required yesterday. - Scheduled Tylenol - Topamax 25 mg x 1 week, titrate up - Tramadol PRN   Paroxysmal Atrial Fibrillation On Eliquis, also has history of PE.  Heart rate well controlled heart rate yesterday ranged 84-95. -Continue diltiazem 24-hour capsule 120 mg daily -Eliquis 5 mg twice daily  DM No home diabetes medications. Last A1c 7.8 on 12/19.Likely 2/2 steroid use. -Discontinued sliding scale insulin - Weekly BMP: next 1/11   Hx of PE  -Eliquis 5 mg twice daily  HFpEF Urinary  output ~1.7 L yesterday.  Patient not down ~19L since admission.  On exam, appears euvolemic.  -Diltiazem 120 mg nightly -Flecainide 100 mg twice daily -Lasix 80 mg daily -Metoprolol 12.5 twice daily  HTN Normotensive today. -continue home meds: metoprolol, lasix, diltiazem -monitor with vitals   MDD/ Anxiety  -Atarax 25 mg twice daily as needed -Effexor 150mg  daily   GERD  -Pepcid 20 mg daily -Protonix 40  Nausea QTC 428 12/30. -Compazine5mg q6h PRN  Suspected OSA/OHS  -BiPAP ordered. Sleep study outpatient.  FEN/GI: Heart healthy diet, Protonix, Compazine PPx: Eliquis  Disposition: Medically stable for discharge   Subjective:  [name] White is SOB today and receiving albuterol treatment.   Objective: Temp:  [98.1 F (36.7 C)-98.2 F (36.8 C)] 98.2 F (36.8 C) (01/07 2006) Pulse Rate:  [84-95] 87 (01/08 0002) Resp:  [18-20] 20 (01/08 0002) BP: (104-122)/(56-96) 110/56 (01/07 2251) SpO2:  [94 %-98 %] 94 % (01/08 0002)  Physical Exam:  GEN: pleasant female, receiving albuterol treatment  CV: regular rate and rhythm, no murmurs appreciated  RESP: no increased work of breathing, clear to ascultation bilaterally ABD: Bowel sounds present. Obese abdomen. Soft, Nontender, Nondistended.  MSK: no LE edema appreciated, or cyanosis noted SKIN: warm, dry NEURO: alert and oriented        Laboratory: Recent Labs  Lab 06/29/19 0243 06/30/19 0508 07/01/19 0445  WBC 9.6 9.4 8.6  HGB 9.8* 9.6* 9.6*  HCT 33.7* 33.5* 33.2*  PLT 509* 453* 471*   Recent Labs  Lab 06/29/19 0243 06/30/19 0508 07/01/19 0445  NA 137 140 138  K 4.1 4.5 4.2  CL 92* 97* 95*  CO2 32 36* 37*  BUN 6* 8 6*  CREATININE 0.48 0.57 0.67  CALCIUM 8.6* 8.7* 8.8*  GLUCOSE 111* 113* 147*    Imaging/Diagnostic Tests: No results found.  Lyndee Hensen, DO PGY-1, [facility] 07/05/2019 7:57 AM    FPTS Intern pager: [phone], text pages welcome

## 2019-07-05 NOTE — Progress Notes (Signed)
Per orders, patient will not be placed on Bipap tonight.  Will be left on her 5L [place] to see how she does.  No distress noted at this time, will continue to monitor.

## 2019-07-06 DIAGNOSIS — S31829A Unspecified open wound of left buttock, initial encounter: Secondary | ICD-10-CM | POA: Diagnosis not present

## 2019-07-06 LAB — BASIC METABOLIC PANEL
Anion gap: 8 (ref 5–15)
BUN: 6 mg/dL — ABNORMAL LOW (ref 8–23)
CO2: 36 mmol/L — ABNORMAL HIGH (ref 22–32)
Calcium: 8.7 mg/dL — ABNORMAL LOW (ref 8.9–10.3)
Chloride: 95 mmol/L — ABNORMAL LOW (ref 98–111)
Creatinine, Ser: 0.61 mg/dL (ref 0.44–1.00)
GFR calc Af Amer: >60 mL/min (ref >60)
GFR calc non Af Amer: >60 mL/min (ref >60)
Glucose, Bld: 128 mg/dL — ABNORMAL HIGH (ref 70–99)
Potassium: 3.9 mmol/L (ref 3.5–5.1)
Sodium: 139 mmol/L (ref 135–145)

## 2019-07-06 LAB — CBC
HCT: 33.8 % — ABNORMAL LOW (ref 36.0–46.0)
Hemoglobin: 9.9 g/dL — ABNORMAL LOW (ref 12.0–15.0)
MCH: 26.3 pg (ref 26.0–34.0)
MCHC: 29.3 g/dL — ABNORMAL LOW (ref 30.0–36.0)
MCV: 89.9 fL (ref 80.0–100.0)
Platelets: 553 10*3/uL — ABNORMAL HIGH (ref 150–400)
RBC: 3.76 MIL/uL — ABNORMAL LOW (ref 3.87–5.11)
RDW: 18.9 % — ABNORMAL HIGH (ref 11.5–15.5)
WBC: 10 10*3/uL (ref 4.0–10.5)
nRBC: 0 % (ref 0.0–0.2)

## 2019-07-06 MED ORDER — DOXYCYCLINE HYCLATE 100 MG PO TABS
100.0000 mg | ORAL_TABLET | Freq: Two times a day (BID) | ORAL | Status: DC
  Administered 2019-07-06 – 2019-07-07 (×3): 100 mg via ORAL
  Filled 2019-07-06 (×3): qty 1

## 2019-07-06 NOTE — Progress Notes (Addendum)
Paged by nurse of purulent drainge from wound on left buttock.  Patient states it has been there about 2 weeks.  Patient is unclear on timeline of whether she had it for she came into the hospital or she got it while she was here.  No documentation of this wound on H&P.  Patient is afebrile and vital signs are stable.  Patient satting well on her 4 to 5 L of oxygen. No labs in a few days.  On physical exam patient has small lateral abscess that has mild purulent drainage, mildly tender to palpation.  There is a small circumferential erythema.  Will consult general surgery for possible bedside I&D. Will get BMP and CBC.

## 2019-07-06 NOTE — Consult Note (Signed)
Reason for Consult: Left buttock wound Referring Physician: Gwendlyn Deutscher MD  [name] White is an 64 y.o. female.  HPI: Asked see patient for evaluation of left buttock wound.  The nurses noted a draining wound from the left buttock today.  She has been the hospital for 2 weeks and states it has been there for 2 weeks according the patient.  She has no fever or chills.  Past Medical History:  Diagnosis Date  . Abdominal wall hematoma 03/06/2019  . Acute bronchitis 08/29/2018  . Acute on chronic respiratory failure with hypoxia ([place]) 08/29/2018  . Anxiety   . Asthma   . Chronic lower back pain   . COPD (chronic obstructive pulmonary disease) ([place])   . Depression   . Exposure to COVID-19 virus 11/02/2018  . Fall 02/2019  . Family history of anesthesia complication    "daughter; causes her to pass out afterwards"  . GERD (gastroesophageal reflux disease)   . History of atrial flutter 06/26/2015  . History of pulmonary embolus (PE) 11/03/2014  . Hypertension   . Hyperthyroidism   . Left medial tibial stress syndrome 12/26/2017  . Lower GI bleed   . Migraine    "monthly" (12/28/2013)  . Non-traumatic rhabdomyolysis 11/03/2014  . Obesity hypoventilation syndrome ([place]) 03/06/2019  . Osteoarthritis    "both knees; back of my neck; right pelvic bone" (12/28/2013)  . Paroxysmal A-fib ([place])   . Pulmonary embolism ([place]) 12/28/2013   "2 clots in each lung"    Past Surgical History:  Procedure Laterality Date  . ABDOMINAL HYSTERECTOMY    . APPENDECTOMY    . BREAST CYST EXCISION Right   . DILATION AND CURETTAGE OF UTERUS    . ELECTROPHYSIOLOGIC STUDY N/A 05/27/2015   Procedure: A-Flutter;  Surgeon: Evans Lance, MD;  Location: [facility];  Service: Cardiovascular;  Laterality: N/A;  . EXCISIONAL HEMORRHOIDECTOMY    . NASAL SINUS SURGERY  2007  . THYROIDECTOMY, PARTIAL Right 2005  . TUBAL LIGATION    . WISDOM TOOTH EXTRACTION      Family History  Problem Relation Age of Onset  .  Osteoarthritis Mother   . Asthma Mother   . Heart failure Mother   . Breast cancer Daughter     Social History:  reports that she quit smoking about 19 years ago. Her smoking use included cigarettes. She has a 5.00 pack-year smoking history. She has never used smokeless tobacco. She reports previous alcohol use. She reports that she does not use drugs.  Allergies:  Allergies  Allergen Reactions  . Caffeine Other (See Comments)    Migraine  . Hydralazine Hcl     Other reaction(s): Other (See Comments) AKI leading to rhabdo and electrolyte abnormalities  . Hydrocodone Nausea And Vomiting    Headache  . Ciprofloxacin Hives and Rash  . Erythromycin Hives and Rash  . Lisinopril Cough  . Oxycodone Nausea And Vomiting    Headache  . Sulfamethoxazole-Trimethoprim Rash    Medications: I have reviewed the patient's current medications.  No results found for this or any previous visit (from the past 48 hour(s)).  No results found.  Review of Systems  Respiratory: Positive for shortness of breath.    Blood pressure 129/78, pulse 88, temperature 98 F (36.7 C), temperature source Oral, resp. rate 18, height 5\' 5"  (1.651 m), weight (!) 199.3 kg, SpO2 94 %. Physical Exam  Constitutional: She appears well-developed.  HENT:  Head: Normocephalic.  Eyes: Pupils are equal, round, and reactive  to light.  Cardiovascular: Normal rate.  Respiratory: Effort normal.  Skin:  Left buttock is a 4 cm area that is draining.  There is some darkening of the skin.  This appears to be an old hematoma.  There is no pus.  It is sore to palpation.  No cellulitis.  Drainage appears to be serous in nature    Assessment/Plan: Left buttock hematoma spontaneously draining  This requires no further care.  Recommend warm compresses.  Given patient's overall health, recommend doxycycline 100 mg p.o. twice daily.  Cover with dry dressing and wash daily.  Elisabel Hanover A Forbes Loll 07/06/2019, 2:22 PM

## 2019-07-06 NOTE — Care Management (Signed)
Patient continues to exhibit signs of hypercapnia associated with chronic respiratory failure secondary to severe restrictive thoracic disorder due to obesity hypoventilation syndrome.  Patient requires the use of NIV both QHS and daytime to help with exacerbation periods.  The use of the NIV will treat patient's high PC02 levels and can reduce risk of exacerbations and future hospitalizations when used at night and during the day. Patient will need these advanced settings in conjunction with current medication regimen; BIPAP is not an option due to its functional limitations and the severity of the patient's condition. Failure to have NIV available for use over a 24 hour period could lead to death. Patient is able to protect their airways and clear secretions on their own.  

## 2019-07-06 NOTE — Progress Notes (Addendum)
During the process of trying to get patient ready for discharge to home safely, family medicine team had placed an order for DME BiPAP.  Case management/social work Tax adviser stated that patient would likely need an outpatient sleep study before being able to get a DME BiPAP.  This was in reference to another note from case manager social work Whitman Hero that stated the the [employer] [facility] liaison Zach made Dr. Loanne Drilling aware that patient would need sleep study. Page CCM to discuss further.  Discussed case with Eric Form, who recommended Ensure that patient is scheduled for sleep study and discharging patient home with trilogy machine.  Will place order for this and ensure sleep study is scheduled.   Will work with CM/SW to ensure patient gets sleep study and pulmonology f/u. Appreciate help from pulmonary critical care medicine and CM/SW in this matter.

## 2019-07-06 NOTE — Care Management (Addendum)
Prior to admission, patient active w [facility] for [facility] PT OT.  Pt will need sleep study prior to receiving a bipap. [employer] works with [employer] to provide pt with mail out home sleep study. Pt needs to call  [facility] medical @ [phone], ex.[phone]our[address]) to process mail out sleep study prior to d/c.  This number is not answered on weekends   15:41 Order changed to Trilogy. Spoke w Zack from [facility]. He will review chart to assess if patient qualifies.   16:40 Obtained paper order and faxed to [place] at [place].

## 2019-07-06 NOTE — Care Management (Deleted)
Patient continues to exhibit signs of hypercapnia associated with chronic respiratory failure secondary to severe COPD.  Patient requires the use of NIV both QHS and daytime to help with exacerbation periods.  The use of the NIV will treat patient's high PC02 levels and can reduce risk of exacerbations and future hospitalizations when used at night and during the day. Patient will need these advanced settings in conjunction with current medication regimen; BIPAP is not an option due to its functional limitations and the severity of the patient's condition. Failure to have NIV available for use over a 24 hour period could lead to death. Patient is able to protect their airways and clear secretions on their own.

## 2019-07-06 NOTE — Progress Notes (Signed)
Family Medicine Teaching Service Daily Progress Note Intern Pager: [phone]  Patient name: [name] White Medical record number: [id number] Date of birth: 12-05-1955 Age: 64 y.o. Gender: female  Primary Care Provider: [name], MD Consultants: Pulmonology  Code Status: DNR  Pt Overview and Major Events to Date:  06/21/19 - admitted, started on BIPAP 06/22/19 - Pulmonology consulted 06/27/19 - Medically stable for dc, awaiting SNF  Assessment and Plan: [name] White is a 64 y.o. female with history of vocal cord dysfunction, HFpEF, OSA not on CPAP, chronic hypoxemic respiratory failure, obesity, atrial fibrillation, and pulmonary embolism presenting with acute on chronic hypoxemic hypercarbic respiratory failure that has improved after BiPAP.    Acute on chronic Hypoxic/Hypercapneic RespiratoryFailure, improving Pt reports mild chest tightness and SOB this morning. Was saturating at 96% on 5L this morning. I reduced her oxygen to 4L and sats were above 94%. RR 19.  On examination bilateral wheeze throughout lung fields. Recommended breathing treatment to RN.  Did not use CPAP/BiPAP overnight.  Social work working diligently to obtain bed placement.  Patient continues to be medically stable for discharge. -Continuous pulse oximetry -Nightly BiPAP -Dulera twice daily -Incruse daily -DuoNebs as needed -Albuterol every 2 hours as needed -Pulmonology consulted, appreciated recommendations: Patient to follow-up in 1 week for sleep study   Headache No headache reported this morning Started migraine prophylaxis, Topamax, yesterday.  As needed tramadol required yesterday. - Scheduled Tylenol - Topamax 25 mg x 1 week, titrate up - Tramadol PRN   Paroxysmal Atrial Fibrillation On Eliquis, also has history of PE.  HR 89 -Continue diltiazem 24-hour capsule 120 mg daily -Eliquis 5 mg twice daily  DM No home diabetes medications. Last A1c 7.8 on 12/19.Likely 2/2 steroid  use. -Discontinued sliding scale insulin - Weekly BMP: next 1/11   Hx of PE  -Eliquis 5 mg twice daily  HFpEF No UOP charted for yesterday.  Patient not down ~19L since admission.   -Diltiazem 120 mg nightly -Flecainide 100 mg twice daily -Lasix 80 mg daily -Metoprolol 12.5 twice daily  HTN Normotensive today. -continue home meds: metoprolol, lasix, diltiazem -monitor with vitals   MDD/ Anxiety  -Atarax 25 mg twice daily as needed -Effexor 150mg  daily   GERD  -Pepcid 20 mg daily -Protonix 40  Nausea QTC 428 12/30. -Compazine5mg q6h PRN  Suspected OSA/OHS  -BiPAP ordered. Sleep study outpatient.  FEN/GI: Heart healthy diet, Protonix, Compazine PPx: Eliquis  Disposition: Medically stable for discharge   Subjective:  Pt reports mild chest tightness and SOB this morning.  Objective: Temp:  [98.1 F (36.7 C)-98.2 F (36.8 C)] 98.1 F (36.7 C) (01/08 2358) Pulse Rate:  [88-93] 89 (01/08 2358) Resp:  [18-19] 19 (01/08 2358) BP: (115-139)/(53-75) 122/68 (01/08 2358) SpO2:  [95 %-99 %] 97 % (01/08 2358)  Physical Exam:  General: Alert, cooperative, appears to be in no acute distress watching tv Cardio: Normal S1 and S2, RRR No murmurs or rubs.   Pulm: scattered wheeze throughout lung fields. no crackles. Mild-moderate increased WOB. This is pt's baseline. Extremities: No peripheral edema. Warm/ well perfused.  Strong radial pulse Neuro: Cranial nerves grossly intact   Laboratory: Recent Labs  Lab 06/30/19 0508 07/01/19 0445  WBC 9.4 8.6  HGB 9.6* 9.6*  HCT 33.5* 33.2*  PLT 453* 471*   Recent Labs  Lab 06/30/19 0508 07/01/19 0445  NA 140 138  K 4.5 4.2  CL 97* 95*  CO2 36* 37*  BUN 8 6*  CREATININE 0.57 0.67  CALCIUM 8.7* 8.8*  GLUCOSE 113* 147*    Imaging/Diagnostic Tests: No results found.  Lattie Haw  PGY-1, [place] Intern pager: [phone], text pages welcome

## 2019-07-07 ENCOUNTER — Other Ambulatory Visit: Payer: Self-pay | Admitting: Family Medicine

## 2019-07-07 MED ORDER — MELATONIN 3 MG PO TABS: 3.0000 mg | ORAL_TABLET | Freq: Every evening | ORAL | 0 refills | Status: AC | PRN

## 2019-07-07 MED ORDER — TOPIRAMATE 25 MG PO TABS: 25.0000 mg | ORAL_TABLET | Freq: Every day | ORAL | 0 refills | Status: DC

## 2019-07-07 MED ORDER — IPRATROPIUM-ALBUTEROL 0.5-2.5 (3) MG/3ML IN SOLN: 3.0000 mL | Freq: Two times a day (BID) | RESPIRATORY_TRACT | 3 refills | Status: DC | PRN

## 2019-07-07 MED ORDER — TRAMADOL HCL 50 MG PO TABS: 50.0000 mg | ORAL_TABLET | Freq: Two times a day (BID) | ORAL | 0 refills | Status: DC | PRN

## 2019-07-07 MED ORDER — DOXYCYCLINE HYCLATE 100 MG PO TABS: 100.0000 mg | ORAL_TABLET | Freq: Two times a day (BID) | ORAL | 0 refills | Status: AC

## 2019-07-07 MED ORDER — UMECLIDINIUM BROMIDE 62.5 MCG/INH IN AEPB: 1.0000 | INHALATION_SPRAY | Freq: Every day | RESPIRATORY_TRACT | 0 refills | Status: DC

## 2019-07-07 NOTE — Care Management (Signed)
1:30 Spoke w patient's niece, at her request. Alta Corning Niece   [phone]   Jonelle Sidle states hat she plans on being home all day. She is able to be the second person taught how to use the trilogy at home as required by [employer]. [employer] will deliver triogy to her house at [place].  I have notified Dr Susa Simmonds of the above,and that [facility] is set up and patient is ready from CM standpoint for DC. CM will print out PTAR papers to nurses station.  Please ensure that patient has DNR forms signed for PTAR.   I have notified Zack w [employer] of probable DC today and he will have RT w Trology at the home this evening unless I call him back to cancel.

## 2019-07-07 NOTE — Progress Notes (Signed)
Discharge teaching complete. Meds, diet, activity, follow up appointments reviewed and all questions answered. Prescriptions sent to pharmacy.

## 2019-07-07 NOTE — Care Management (Addendum)
PTAR called for DC Notified niece Jonelle Sidle that patient will be on her way home. Notified Zack with [employer] that patient is being discharged now. [facility] notified of DC

## 2019-07-07 NOTE — Progress Notes (Signed)
PTAR here to take patient home via stretcher.

## 2019-07-07 NOTE — Progress Notes (Signed)
Family Medicine Teaching Service Daily Progress Note Intern Pager: [phone]  Patient name: [name] White Medical record number: [id number] Date of birth: 1956/05/03 Age: 64 y.o. Gender: female  Primary Care Provider: [name], MD Consultants: Pulmonology, Neurology  Code Status: DNR  Pt Overview and Major Events to Date:  06/21/19 - admitted, started on BIPAP 06/22/19 - Pulmonology consulted 06/27/19 - Medically stable for dc, awaiting SNF  Assessment and Plan: [name] White is a 64 y.o. female with history of vocal cord dysfunction, HFpEF, OSA not on CPAP, chronic hypoxemic respiratory failure, obesity, atrial fibrillation, and pulmonary embolism presenting with acute on chronic hypoxemic hypercarbic respiratory failure that has improved after BiPAP.    Acute on chronic Hypoxic/Hypercapneic RespiratoryFailure, improving Denies current shortness of breath.  Satting well on 5L  CPAP with BiPAP settings this morning.  Home O2 3-4L.  Lungs clear this morning.  Used CPAP overnight with BiPAP settings.  Patient to go home with Trilogy machine as she failed her overnight trial.  Patient medically stable for discharge.  Patient has sleep study equipment waiting at home. Will ensure pulmonology appointment is scheduled.  - Dulera BID  - Incruse daily  - Continuous pulse oximetry  - Albuterol q2h PRN  - Duonebs BID PRN  -Pulmonology consulted, appreciated recommendations: Patient to follow-up in 1 week   Left Buttock  Hematoma vs Cellulitis  Serosanguinous drainage on left hip. Patient reports she "gets these" when moved by EMS during transport. Evaluated by general surgery yesterday for possible I&D. No I&D recommended. Continue doxycline x1 week  - Doxycycline 100 mg BID (1/9 - to end on 1/15)    Headache Started migraine prophylaxis, Topamax, on 1/7.   No PRN tramadol required yesterday.  - Scheduled Tylenol - Topamax 25 mg x 1 week, titrate up - Tramadol PRN    Paroxysmal Atrial Fibrillation On Eliquis, also has history of PE. Rate controlled.   -Continue diltiazem 24-hour capsule 120 mg daily -Eliquis 5 mg twice daily  DM No home diabetes medications. Last A1c 7.8 on 12/19.Likely 2/2 steroid use. -Discontinued sliding scale insulin - Weekly BMP: next 1/11   Hx of PE  -Eliquis 5 mg twice daily  HFpEF 1.9L yesterday with home undocumented occurrence.  Patient not down ~19L since admission.   -Diltiazem 120 mg nightly -Flecainide 100 mg twice daily -Lasix 80 mg daily -Metoprolol 12.5 twice daily  HTN Normotensive today. -continue home meds: metoprolol, lasix, diltiazem -monitor with vitals   MDD/ Anxiety  -Atarax 25 mg twice daily as needed -Effexor 150mg  daily   GERD  -Pepcid 20 mg daily -Protonix 40  Nausea QTC 428 12/30. -Compazine5mg q6h PRN  Suspected OSA/OHS  -BiPAP ordered. Sleep study outpatient.  FEN/GI: Heart healthy diet, Protonix, Compazine PPx: Eliquis  Disposition: Medically stable for discharge   Subjective:  [name] White had no significant overnight events.  Denies SOB this morning.   Objective: Temp:  [98 F (36.7 C)-98.4 F (36.9 C)] 98.4 F (36.9 C) (01/09 2105) Pulse Rate:  [88-96] 95 (01/09 2130) Resp:  [18] 18 (01/09 2130) BP: (119-129)/(67-78) 119/67 (01/09 2105) SpO2:  [94 %-99 %] 96 % (01/09 2130)  Physical Exam:  GEN: pleasant female lying in bed without acute distress  CV: regular rate and rhythm, no murmurs appreciated RESP: no increased work of breathing, clear to ascultation bilaterally with no crackles, wheezes, or rhonchi ABD: Bowel sounds present. Soft, obese abdomen, nontender, Nondistended.  MSK: no pitting lower extremity edema, or cyanosis noted SKIN: warm,  dry, left hip wound with serosanguinous discharge, mild tenderness with palpation      Laboratory: Recent Labs  Lab 07/01/19 0445 07/06/19 1502  WBC 8.6 10.0  HGB 9.6* 9.9*  HCT  33.2* 33.8*  PLT 471* 553*   Recent Labs  Lab 07/01/19 0445 07/06/19 1502  NA 138 139  K 4.2 3.9  CL 95* 95*  CO2 37* 36*  BUN 6* 6*  CREATININE 0.67 0.61  CALCIUM 8.8* 8.7*  GLUCOSE 147* 128*    Imaging/Diagnostic Tests: No results found.  Lyndee Hensen, DO PGY-1, [facility] [place] 07/07/2019 8:26 AM    FPTS Intern pager: [phone], text pages welcome

## 2019-07-07 NOTE — Discharge Instructions (Addendum)
You were admitted due to your difficulty breathing.  During this admission pulmonology was consulted and recommended you continue a BiPAP at home at nighttime.  They will help set this up.  You should also have a home sleep study at home that needs to be done.  Please send  in your sleep study results to [facility].  Please make sure you follow-up with pulmonology within a week.  You have a follow-up appointment with your PCP Dr. Kris Mouton set up for January 15, if you have a cough, shortness of breath, fever please call prior to arriving to the clinic.  It is going to be very important that you use your Trilogy at home to ensure adequate oxygenation at bedtime.  Please call your pulmonologist to get a follow up appointment: [facility] [phone]  Please call [facility] @ [phone], ex.[phone] Jodi Mourning) to process mail out sleep study.   Acute Respiratory Failure, Adult  Acute respiratory failure occurs when there is not enough oxygen passing from your lungs to your body. When this happens, your lungs have trouble removing carbon dioxide from the blood. This causes your blood oxygen level to drop too low as carbon dioxide builds up. Acute respiratory failure is a medical emergency. It can develop quickly, but it is temporary if treated promptly. Your lung capacity, or how much air your lungs can hold, may improve with time, exercise, and treatment. What are the causes? There are many possible causes of acute respiratory failure, including:  Lung injury.  Chest injury or damage to the ribs or tissues near the lungs.  Lung conditions that affect the flow of air and blood into and out of the lungs, such as pneumonia, acute respiratory distress syndrome, and cystic fibrosis.  Medical conditions, such as strokes or spinal cord injuries, that affect the muscles and nerves that control breathing.  Blood infection (sepsis).  Inflammation of the pancreas (pancreatitis).  A  blood clot in the lungs (pulmonary embolism).  A large-volume blood transfusion.  Burns.  Near-drowning.  Seizure.  Smoke inhalation.  Reaction to medicines.  Alcohol or drug overdose. What increases the risk? This condition is more likely to develop in people who have:  A blocked airway.  Asthma.  A condition or disease that damages or weakens the muscles, nerves, bones, or tissues that are involved in breathing.  A serious infection.  A health problem that blocks the unconscious reflex that is involved in breathing, such as hypothyroidism or sleep apnea.  A lung injury or trauma. What are the signs or symptoms? Trouble breathing is the main symptom of acute respiratory failure. Symptoms may also include:  Rapid breathing.  Restlessness or anxiety.  Skin, lips, or fingernails that appear blue (cyanosis).  Rapid heart rate.  Abnormal heart rhythms (arrhythmias).  Confusion or changes in behavior.  Tiredness or loss of energy.  Feeling sleepy or having a loss of consciousness. How is this diagnosed? Your health care provider can diagnose acute respiratory failure with a medical history and physical exam. During the exam, your health care provider will listen to your heart and check for crackling or wheezing sounds in your lungs. Your may also have tests to confirm the diagnosis and determine what is causing respiratory failure. These tests may include:  Measuring the amount of oxygen in your blood (pulse oximetry). The measurement comes from a small device that is placed on your finger, earlobe, or toe.  Other blood tests to measure blood gases and to look for  signs of infection.  Sampling your cerebral spinal fluid or tracheal fluid to check for infections.  Chest X-ray to look for fluid in spaces that should be filled with air.  Electrocardiogram (ECG) to look at the heart's electrical activity. How is this treated? Treatment for this condition usually  takes places in a hospital intensive care unit (ICU). Treatment depends on what is causing the condition. It may include one or more treatments until your symptoms improve. Treatment may include:  Supplemental oxygen. Extra oxygen is given through a tube in the nose, a face mask, or a hood.  A device such as a continuous positive airway pressure (CPAP) or bi-level positive airway pressure (BiPAP or BPAP) machine. This treatment uses mild air pressure to keep the airways open. A mask or other device will be placed over your nose or mouth. A tube that is connected to a motor will deliver oxygen through the mask.  Ventilator. This treatment helps move air into and out of the lungs. This may be done with a bag and mask or a machine. For this treatment, a tube is placed in your windpipe (trachea) so air and oxygen can flow to the lungs.  Extracorporeal membrane oxygenation (ECMO). This treatment temporarily takes over the function of the heart and lungs, supplying oxygen and removing carbon dioxide. ECMO gives the lungs a chance to recover. It may be used if a ventilator is not effective.  Tracheostomy. This is a procedure that creates a hole in the neck to insert a breathing tube.  Receiving fluids and medicines.  Rocking the bed to help breathing. Follow these instructions at home:  Take over-the-counter and prescription medicines only as told by your health care provider.  Return to normal activities as told by your health care provider. Ask your health care provider what activities are safe for you.  Keep all follow-up visits as told by your health care provider. This is important. How is this prevented? Treating infections and medical conditions that may lead to acute respiratory failure can help prevent the condition from developing. Contact a health care provider if:  You have a fever.  Your symptoms do not improve or they get worse. Get help right away if:  You are having trouble  breathing.  You lose consciousness.  Your have cyanosis or turn blue.  You develop a rapid heart rate.  You are confused. These symptoms may represent a serious problem that is an emergency. Do not wait to see if the symptoms will go away. Get medical help right away. Call your local emergency services (911 in the [place] Do not drive yourself to the hospital. This information is not intended to replace advice given to you by your health care provider. Make sure you discuss any questions you have with your health care provider. Document Revised: 05/26/2017 Document Reviewed: 12/30/2015 Elsevier Patient Education  [place] on my medicine - ELIQUIS (apixaban)  This medication education was reviewed with me or my healthcare representative as part of my discharge preparation.   Why was Eliquis prescribed for you? Eliquis was prescribed for you to reduce the risk of a blood clot forming if you have a medical condition called atrial fibrillation (a type of irregular heartbeat).  What do You need to know about Eliquis ? Take your Eliquis TWICE DAILY - one tablet in the morning and one tablet in the evening with or without food. If you have difficulty swallowing the tablet whole please discuss with  your pharmacist how to take the medication safely.  Take Eliquis exactly as prescribed by your doctor and DO NOT stop taking Eliquis without talking to the doctor who prescribed the medication.  Stopping may increase your risk of developing a stroke.  Refill your prescription before you run out.  After discharge, you should have regular check-up appointments with your healthcare provider that is prescribing your Eliquis.  In the future your dose may need to be changed if your kidney function or weight changes by a significant amount or as you get older.  What do you do if you miss a dose? If you miss a dose, take it as soon as you remember on the same day and resume  taking twice daily.  Do not take more than one dose of ELIQUIS at the same time to make up a missed dose.  Important Safety Information A possible side effect of Eliquis is bleeding. You should call your healthcare provider right away if you experience any of the following: ? Bleeding from an injury or your nose that does not stop. ? Unusual colored urine (red or dark brown) or unusual colored stools (red or black). ? Unusual bruising for unknown reasons. ? A serious fall or if you hit your head (even if there is no bleeding).  Some medicines may interact with Eliquis and might increase your risk of bleeding or clotting while on Eliquis. To help avoid this, consult your healthcare provider or pharmacist prior to using any new prescription or non-prescription medications, including herbals, vitamins, non-steroidal anti-inflammatory drugs (NSAIDs) and supplements.  This website has more information on Eliquis (apixaban): http://www.eliquis.com/eliquis/home

## 2019-07-08 ENCOUNTER — Telehealth: Payer: Self-pay

## 2019-07-08 DIAGNOSIS — J441 Chronic obstructive pulmonary disease with (acute) exacerbation: Secondary | ICD-10-CM

## 2019-07-08 NOTE — Telephone Encounter (Signed)
Pt calls nurse line with requests for multiple orders. Pt receiving services from [facility]. See below. 1. Order for new hospital bed in exchange for her current bed. Pt states that she has fallen out of current bed and needs new bed. 2. Exchange oxygen concentrator: current one is too small 3. New nebulizer 4. Travel O2 tank 5. Heavy duty wheelchair  To PCP

## 2019-07-08 NOTE — Consult Note (Signed)
   [facility] CM Inpatient Consult   07/08/2019  [name] White 20-May-1956 [id number]    Update Note:   Following disposition and needs of this [facility] Medicare patient with extreme high risk score for readmission, with multiple hospitalizations and ED visits.  Transition of care CM note, reveals that prior to admission, patient was active with [facility] for [facility] PT/ OT. Patient was discharged home yesterday with [facility] health [facility]).  Plan: Will update [place] RN CM of patient's disposition and needs for follow-up.   For questions and additional information, please call:  Giavonni Fonder A. Charlaine Utsey, BSN, RN-BC [facility] Liaison Cell: [phone]

## 2019-07-09 ENCOUNTER — Ambulatory Visit: Payer: Medicare HMO

## 2019-07-09 ENCOUNTER — Ambulatory Visit: Payer: Self-pay

## 2019-07-09 ENCOUNTER — Other Ambulatory Visit: Payer: Self-pay

## 2019-07-09 NOTE — Chronic Care Management (AMB) (Signed)
  Care Management   Outreach Note  07/09/2019 Name: [name] White MRN: [id number] DOB: 12-Sep-1955  Referred by: [name], MD Reason for referral : Care Coordination (Care Management [facility] F/U Initial )   An unsuccessful telephone outreach was attempted today. The patient was referred to the case management team by for assistance with care management and care coordination.   Follow Up Plan: A HIPPA compliant phone message was left for the patient providing contact information and requesting a return call.  The care management team will reach out to the patient again over the next 3 days.   Lazaro Arms RN, BSN, [facility] Care Management Coordinator [place] Phone: [phone] Fax: [phone]

## 2019-07-09 NOTE — Chronic Care Management (AMB) (Signed)
  Care Management   Outreach Note  07/09/2019 Name: [name] White MRN: [id number] DOB: 11/22/1955  Referred by: [name], MD Reason for referral : Care Coordination ([facility] F/U Initial)   An unsuccessful telephone outreach was attempted today. The patient was referred to the case management team by for assistance with care management and care coordination.   Follow Up Plan: A HIPPA compliant phone message was left for the patient providing contact information and requesting a return call.  The care management team will reach out to the patient again over the next 3 days.   Lazaro Arms RN, BSN, [facility] Care Management Coordinator [place] Phone: [phone] Fax: [phone]

## 2019-07-10 DIAGNOSIS — J9611 Chronic respiratory failure with hypoxia: Secondary | ICD-10-CM | POA: Diagnosis not present

## 2019-07-10 NOTE — Telephone Encounter (Signed)
Patient calls nurse line to check the status. Please let RN team know once DME orders are placed so we can process.

## 2019-07-11 NOTE — Telephone Encounter (Signed)
Community message sent to Erin Hearing, and [place].

## 2019-07-11 NOTE — Telephone Encounter (Signed)
Placed dme orders and routed to rn team  [name] MD PGY-3 Family Medicine Resident

## 2019-07-12 ENCOUNTER — Other Ambulatory Visit: Payer: Self-pay

## 2019-07-12 ENCOUNTER — Telehealth (INDEPENDENT_AMBULATORY_CARE_PROVIDER_SITE_OTHER): Payer: Medicare HMO | Admitting: Family Medicine

## 2019-07-12 DIAGNOSIS — I5033 Acute on chronic diastolic (congestive) heart failure: Secondary | ICD-10-CM | POA: Diagnosis not present

## 2019-07-12 DIAGNOSIS — M170 Bilateral primary osteoarthritis of knee: Secondary | ICD-10-CM | POA: Diagnosis not present

## 2019-07-12 DIAGNOSIS — J441 Chronic obstructive pulmonary disease with (acute) exacerbation: Secondary | ICD-10-CM | POA: Diagnosis not present

## 2019-07-12 DIAGNOSIS — J9601 Acute respiratory failure with hypoxia: Secondary | ICD-10-CM

## 2019-07-12 DIAGNOSIS — I4892 Unspecified atrial flutter: Secondary | ICD-10-CM | POA: Diagnosis not present

## 2019-07-12 DIAGNOSIS — Z09 Encounter for follow-up examination after completed treatment for conditions other than malignant neoplasm: Secondary | ICD-10-CM

## 2019-07-12 DIAGNOSIS — I480 Paroxysmal atrial fibrillation: Secondary | ICD-10-CM | POA: Diagnosis not present

## 2019-07-12 DIAGNOSIS — I110 Hypertensive heart disease with heart failure: Secondary | ICD-10-CM | POA: Diagnosis not present

## 2019-07-12 DIAGNOSIS — G2581 Restless legs syndrome: Secondary | ICD-10-CM | POA: Diagnosis not present

## 2019-07-12 DIAGNOSIS — J9621 Acute and chronic respiratory failure with hypoxia: Secondary | ICD-10-CM | POA: Diagnosis not present

## 2019-07-12 DIAGNOSIS — E662 Morbid (severe) obesity with alveolar hypoventilation: Secondary | ICD-10-CM | POA: Diagnosis not present

## 2019-07-15 ENCOUNTER — Telehealth: Payer: Self-pay

## 2019-07-15 DIAGNOSIS — R32 Unspecified urinary incontinence: Secondary | ICD-10-CM | POA: Diagnosis not present

## 2019-07-15 DIAGNOSIS — J449 Chronic obstructive pulmonary disease, unspecified: Secondary | ICD-10-CM | POA: Diagnosis not present

## 2019-07-15 NOTE — Telephone Encounter (Signed)
[name] White, home health PT, calls nurse line requesting verbal orders for home health.   1x a week for 1 week  2x a week for 3 weeks   Evaluation for skilled nursing for abscess. Evaluation for OT. Evaluation for [facility].  [phone], you can LVM.

## 2019-07-15 NOTE — Telephone Encounter (Signed)
Attempted to contact patient to inform of below and to Scheduled a virtual apt. No answer, VM left for patient to call me back.

## 2019-07-15 NOTE — Telephone Encounter (Signed)
Catron, Witt, Vedia Coffer, [place], CMA; Darlina Guys; Elon Alas  I see she said the concentrator is too small and she wants a bigger one, she is only on 2lpm cont and she has the standard homefill setup with the standard concentrator, there is not one that is bigger that her insurance will pay for. If she is having issues with the bed, she can call in and speak to the troubleshooting department at [phone]. They can attmept to troubleshoot the issue with the bed over the phone and go from there.   We will still need the information on the neb and wc.       Previous Messages   ----- Message -----  From: Mariann Barter  Sent: 07/11/2019  4:03 PM EST  To: Darlina Guys, Ronelle Nigh,   We can do the wheelchair and nebulizer order. BUT we will need face to face or virtual office visit notes that state the need for the nebulizer and state patient has mobility limitations which cannot be resolved with a cane, crutch, or walker. Patient can safely self propel the heavy duty wheelchair or has a caregiver who can assist at all times.   On the bed and oxygen, she already has those 2 items from [place]. She got the oxygen when she discharged [facility] in March 2020, and she got the HD bed, commode, transfer bench, and gel overlay 06/03/2019 when she discharged from [facility].   Please advise when the Dr has had a face to face or virtual visit with the patient and the notes are updated with the above information.   Thank you

## 2019-07-16 DIAGNOSIS — I5033 Acute on chronic diastolic (congestive) heart failure: Secondary | ICD-10-CM | POA: Diagnosis not present

## 2019-07-16 DIAGNOSIS — M170 Bilateral primary osteoarthritis of knee: Secondary | ICD-10-CM | POA: Diagnosis not present

## 2019-07-16 DIAGNOSIS — J9621 Acute and chronic respiratory failure with hypoxia: Secondary | ICD-10-CM | POA: Diagnosis not present

## 2019-07-16 DIAGNOSIS — I480 Paroxysmal atrial fibrillation: Secondary | ICD-10-CM | POA: Diagnosis not present

## 2019-07-16 DIAGNOSIS — I110 Hypertensive heart disease with heart failure: Secondary | ICD-10-CM | POA: Diagnosis not present

## 2019-07-16 DIAGNOSIS — E662 Morbid (severe) obesity with alveolar hypoventilation: Secondary | ICD-10-CM | POA: Diagnosis not present

## 2019-07-16 DIAGNOSIS — J441 Chronic obstructive pulmonary disease with (acute) exacerbation: Secondary | ICD-10-CM | POA: Diagnosis not present

## 2019-07-16 DIAGNOSIS — I4892 Unspecified atrial flutter: Secondary | ICD-10-CM | POA: Diagnosis not present

## 2019-07-16 DIAGNOSIS — G2581 Restless legs syndrome: Secondary | ICD-10-CM | POA: Diagnosis not present

## 2019-07-16 NOTE — Telephone Encounter (Signed)
Did you have a virtual with her on 1/15? I see it, but I don't see it.

## 2019-07-17 DIAGNOSIS — E662 Morbid (severe) obesity with alveolar hypoventilation: Secondary | ICD-10-CM | POA: Diagnosis not present

## 2019-07-17 DIAGNOSIS — J9610 Chronic respiratory failure, unspecified whether with hypoxia or hypercapnia: Secondary | ICD-10-CM | POA: Diagnosis not present

## 2019-07-17 NOTE — Progress Notes (Signed)
[facility] Telemedicine Visit  Patient consented to have virtual visit. Method of visit: Video was attempted, but technology challenges prevented patient from using video, so visit was conducted via telephone.  Encounter participants: Patient: [name] White - located at home Provider: [name] - [facility] at [facility]  Chief Complaint: hospital follow up  HPI: 64 year old who presents as a telemedicine visit for hospital follow-up.  Patient was admitted on 06/25/2019 for respiratory failure.  Patient's respiratory decline felt to be secondary to not using her CPAP.  She was discharged with a trilogy ventilator.  She feels that her breathing has improved significantly since discharge.  She was discharged on 4 L nasal cannula of oxygen, which is what she is doing at this time.  She has pulmonology follow-up within the next 2 weeks. She states that she is going to try and wean her o2 down as able over the next couple of weeks   ROS: per HPI  Pertinent PMHx: acute hypoxic respiratory failure  Exam:  General: well sounding female, no acute distress Respiratory: no wheezing audible, able to speak in clear coherent sentences Psych: pleasant, good thought process  Assessment/Plan:  Acute respiratory failure with hypoxia (HCC) Feels to have improved significantly since being discharged with trilogy ventilator.  Has follow-up with pulmonology soon. Continue albuterol as needed, incruse.    Time spent during visit with patient: 12 minutes

## 2019-07-17 NOTE — Assessment & Plan Note (Addendum)
Feels to have improved significantly since being discharged with trilogy ventilator.  Has follow-up with pulmonology soon. Continue albuterol as needed, incruse.

## 2019-07-17 NOTE — Telephone Encounter (Signed)
Cecile Hearing, home health PT, calling back to check on status of verbal orders for patient.   Cecile Hearing also reports that abscess on L Hip is draining. Drainage is minimal, blood tinged that occurs with pressure to area. Requests order for nurse to evaluate.   To PCP  Talbot Grumbling, RN

## 2019-07-18 DIAGNOSIS — G2581 Restless legs syndrome: Secondary | ICD-10-CM | POA: Diagnosis not present

## 2019-07-18 DIAGNOSIS — I5033 Acute on chronic diastolic (congestive) heart failure: Secondary | ICD-10-CM | POA: Diagnosis not present

## 2019-07-18 DIAGNOSIS — I110 Hypertensive heart disease with heart failure: Secondary | ICD-10-CM | POA: Diagnosis not present

## 2019-07-18 DIAGNOSIS — I480 Paroxysmal atrial fibrillation: Secondary | ICD-10-CM | POA: Diagnosis not present

## 2019-07-18 DIAGNOSIS — M170 Bilateral primary osteoarthritis of knee: Secondary | ICD-10-CM | POA: Diagnosis not present

## 2019-07-18 DIAGNOSIS — J9621 Acute and chronic respiratory failure with hypoxia: Secondary | ICD-10-CM | POA: Diagnosis not present

## 2019-07-18 DIAGNOSIS — I4892 Unspecified atrial flutter: Secondary | ICD-10-CM | POA: Diagnosis not present

## 2019-07-18 DIAGNOSIS — E662 Morbid (severe) obesity with alveolar hypoventilation: Secondary | ICD-10-CM | POA: Diagnosis not present

## 2019-07-18 DIAGNOSIS — J441 Chronic obstructive pulmonary disease with (acute) exacerbation: Secondary | ICD-10-CM | POA: Diagnosis not present

## 2019-07-21 ENCOUNTER — Other Ambulatory Visit: Payer: Self-pay

## 2019-07-21 ENCOUNTER — Observation Stay (HOSPITAL_COMMUNITY): Payer: Medicare HMO

## 2019-07-21 ENCOUNTER — Inpatient Hospital Stay (HOSPITAL_COMMUNITY)
Admission: EM | Admit: 2019-07-21 | Discharge: 2019-07-25 | DRG: 189 | Disposition: A | Discharge status: Home Health | Payer: Medicare HMO | Attending: Family Medicine | Admitting: Family Medicine

## 2019-07-21 ENCOUNTER — Encounter (HOSPITAL_COMMUNITY): Payer: Self-pay | Admitting: Emergency Medicine

## 2019-07-21 ENCOUNTER — Emergency Department (HOSPITAL_COMMUNITY): Payer: Medicare HMO

## 2019-07-21 DIAGNOSIS — Z9981 Dependence on supplemental oxygen: Secondary | ICD-10-CM

## 2019-07-21 DIAGNOSIS — E876 Hypokalemia: Secondary | ICD-10-CM

## 2019-07-21 DIAGNOSIS — L0231 Cutaneous abscess of buttock: Secondary | ICD-10-CM | POA: Diagnosis present

## 2019-07-21 DIAGNOSIS — Z7901 Long term (current) use of anticoagulants: Secondary | ICD-10-CM | POA: Diagnosis not present

## 2019-07-21 DIAGNOSIS — J9601 Acute respiratory failure with hypoxia: Principal | ICD-10-CM | POA: Diagnosis present

## 2019-07-21 DIAGNOSIS — L02416 Cutaneous abscess of left lower limb: Secondary | ICD-10-CM | POA: Diagnosis present

## 2019-07-21 DIAGNOSIS — J383 Other diseases of vocal cords: Secondary | ICD-10-CM | POA: Diagnosis present

## 2019-07-21 DIAGNOSIS — I480 Paroxysmal atrial fibrillation: Secondary | ICD-10-CM | POA: Diagnosis present

## 2019-07-21 DIAGNOSIS — Z20822 Contact with and (suspected) exposure to covid-19: Secondary | ICD-10-CM | POA: Diagnosis present

## 2019-07-21 DIAGNOSIS — Z86711 Personal history of pulmonary embolism: Secondary | ICD-10-CM

## 2019-07-21 DIAGNOSIS — K219 Gastro-esophageal reflux disease without esophagitis: Secondary | ICD-10-CM | POA: Diagnosis present

## 2019-07-21 DIAGNOSIS — I10 Essential (primary) hypertension: Secondary | ICD-10-CM | POA: Diagnosis not present

## 2019-07-21 DIAGNOSIS — Z7951 Long term (current) use of inhaled steroids: Secondary | ICD-10-CM

## 2019-07-21 DIAGNOSIS — Z66 Do not resuscitate: Secondary | ICD-10-CM | POA: Diagnosis present

## 2019-07-21 DIAGNOSIS — L0291 Cutaneous abscess, unspecified: Secondary | ICD-10-CM

## 2019-07-21 DIAGNOSIS — Z6841 Body Mass Index (BMI) 40.0 and over, adult: Secondary | ICD-10-CM | POA: Diagnosis not present

## 2019-07-21 DIAGNOSIS — Z90710 Acquired absence of both cervix and uterus: Secondary | ICD-10-CM | POA: Diagnosis not present

## 2019-07-21 DIAGNOSIS — G2581 Restless legs syndrome: Secondary | ICD-10-CM | POA: Diagnosis present

## 2019-07-21 DIAGNOSIS — I5032 Chronic diastolic (congestive) heart failure: Secondary | ICD-10-CM | POA: Diagnosis present

## 2019-07-21 DIAGNOSIS — J189 Pneumonia, unspecified organism: Secondary | ICD-10-CM | POA: Diagnosis present

## 2019-07-21 DIAGNOSIS — R0689 Other abnormalities of breathing: Secondary | ICD-10-CM | POA: Diagnosis not present

## 2019-07-21 DIAGNOSIS — E662 Morbid (severe) obesity with alveolar hypoventilation: Secondary | ICD-10-CM | POA: Diagnosis present

## 2019-07-21 DIAGNOSIS — R0602 Shortness of breath: Secondary | ICD-10-CM | POA: Diagnosis not present

## 2019-07-21 DIAGNOSIS — Z79899 Other long term (current) drug therapy: Secondary | ICD-10-CM

## 2019-07-21 DIAGNOSIS — F419 Anxiety disorder, unspecified: Secondary | ICD-10-CM | POA: Diagnosis present

## 2019-07-21 DIAGNOSIS — R279 Unspecified lack of coordination: Secondary | ICD-10-CM | POA: Diagnosis not present

## 2019-07-21 DIAGNOSIS — J441 Chronic obstructive pulmonary disease with (acute) exacerbation: Secondary | ICD-10-CM | POA: Diagnosis present

## 2019-07-21 DIAGNOSIS — F329 Major depressive disorder, single episode, unspecified: Secondary | ICD-10-CM | POA: Diagnosis present

## 2019-07-21 DIAGNOSIS — E039 Hypothyroidism, unspecified: Secondary | ICD-10-CM | POA: Diagnosis present

## 2019-07-21 DIAGNOSIS — K5900 Constipation, unspecified: Secondary | ICD-10-CM | POA: Diagnosis present

## 2019-07-21 DIAGNOSIS — R230 Cyanosis: Secondary | ICD-10-CM | POA: Diagnosis not present

## 2019-07-21 DIAGNOSIS — G43909 Migraine, unspecified, not intractable, without status migrainosus: Secondary | ICD-10-CM | POA: Diagnosis present

## 2019-07-21 DIAGNOSIS — R52 Pain, unspecified: Secondary | ICD-10-CM | POA: Diagnosis not present

## 2019-07-21 DIAGNOSIS — E119 Type 2 diabetes mellitus without complications: Secondary | ICD-10-CM | POA: Diagnosis present

## 2019-07-21 DIAGNOSIS — R600 Localized edema: Secondary | ICD-10-CM | POA: Diagnosis not present

## 2019-07-21 DIAGNOSIS — R0902 Hypoxemia: Secondary | ICD-10-CM | POA: Diagnosis not present

## 2019-07-21 DIAGNOSIS — Z87891 Personal history of nicotine dependence: Secondary | ICD-10-CM

## 2019-07-21 DIAGNOSIS — I110 Hypertensive heart disease with heart failure: Secondary | ICD-10-CM | POA: Diagnosis present

## 2019-07-21 DIAGNOSIS — J9611 Chronic respiratory failure with hypoxia: Secondary | ICD-10-CM | POA: Diagnosis not present

## 2019-07-21 DIAGNOSIS — J80 Acute respiratory distress syndrome: Secondary | ICD-10-CM | POA: Diagnosis not present

## 2019-07-21 DIAGNOSIS — Z743 Need for continuous supervision: Secondary | ICD-10-CM | POA: Diagnosis not present

## 2019-07-21 DIAGNOSIS — J984 Other disorders of lung: Secondary | ICD-10-CM | POA: Diagnosis not present

## 2019-07-21 LAB — POCT I-STAT 7, (LYTES, BLD GAS, ICA,H+H)
Acid-Base Excess: 7 mmol/L — ABNORMAL HIGH (ref 0.0–2.0)
Bicarbonate: 34.1 mmol/L — ABNORMAL HIGH (ref 20.0–28.0)
Calcium, Ion: 1.23 mmol/L (ref 1.15–1.40)
HCT: 34 % — ABNORMAL LOW (ref 36.0–46.0)
Hemoglobin: 11.6 g/dL — ABNORMAL LOW (ref 12.0–15.0)
O2 Saturation: 96 %
Patient temperature: 98.9
Potassium: 2.6 mmol/L — CL (ref 3.5–5.1)
Sodium: 142 mmol/L (ref 135–145)
TCO2: 36 mmol/L — ABNORMAL HIGH (ref 22–32)
pCO2 arterial: 60.5 mmHg — ABNORMAL HIGH (ref 32.0–48.0)
pH, Arterial: 7.36 (ref 7.350–7.450)
pO2, Arterial: 87 mmHg (ref 83.0–108.0)

## 2019-07-21 LAB — CBC WITH DIFFERENTIAL/PLATELET
Abs Immature Granulocytes: 0.04 10*3/uL (ref 0.00–0.07)
Basophils Absolute: 0 10*3/uL (ref 0.0–0.1)
Basophils Relative: 0 %
Eosinophils Absolute: 0.5 10*3/uL (ref 0.0–0.5)
Eosinophils Relative: 5 %
HCT: 33.1 % — ABNORMAL LOW (ref 36.0–46.0)
Hemoglobin: 9.4 g/dL — ABNORMAL LOW (ref 12.0–15.0)
Immature Granulocytes: 0 %
Lymphocytes Relative: 35 %
Lymphs Abs: 3.8 10*3/uL (ref 0.7–4.0)
MCH: 24.8 pg — ABNORMAL LOW (ref 26.0–34.0)
MCHC: 28.4 g/dL — ABNORMAL LOW (ref 30.0–36.0)
MCV: 87.3 fL (ref 80.0–100.0)
Monocytes Absolute: 1 10*3/uL (ref 0.1–1.0)
Monocytes Relative: 9 %
Neutro Abs: 5.6 10*3/uL (ref 1.7–7.7)
Neutrophils Relative %: 51 %
Platelets: 564 10*3/uL — ABNORMAL HIGH (ref 150–400)
RBC: 3.79 MIL/uL — ABNORMAL LOW (ref 3.87–5.11)
RDW: 19.1 % — ABNORMAL HIGH (ref 11.5–15.5)
WBC: 11 10*3/uL — ABNORMAL HIGH (ref 4.0–10.5)
nRBC: 0.2 % (ref 0.0–0.2)

## 2019-07-21 LAB — RESPIRATORY PANEL BY RT PCR (FLU A&B, COVID)
Influenza A by PCR: NEGATIVE
Influenza B by PCR: NEGATIVE
SARS Coronavirus 2 by RT PCR: NEGATIVE

## 2019-07-21 LAB — COMPREHENSIVE METABOLIC PANEL
ALT: 21 U/L (ref 0–44)
AST: 22 U/L (ref 15–41)
Albumin: 3 g/dL — ABNORMAL LOW (ref 3.5–5.0)
Alkaline Phosphatase: 73 U/L (ref 38–126)
Anion gap: 11 (ref 5–15)
BUN: 9 mg/dL (ref 8–23)
CO2: 31 mmol/L (ref 22–32)
Calcium: 8.7 mg/dL — ABNORMAL LOW (ref 8.9–10.3)
Chloride: 100 mmol/L (ref 98–111)
Creatinine, Ser: 0.53 mg/dL (ref 0.44–1.00)
GFR calc Af Amer: >60 mL/min (ref >60)
GFR calc non Af Amer: >60 mL/min (ref >60)
Glucose, Bld: 107 mg/dL — ABNORMAL HIGH (ref 70–99)
Potassium: 2.4 mmol/L — CL (ref 3.5–5.1)
Sodium: 142 mmol/L (ref 135–145)
Total Bilirubin: 0.4 mg/dL (ref 0.3–1.2)
Total Protein: 7.2 g/dL (ref 6.5–8.1)

## 2019-07-21 LAB — D-DIMER, QUANTITATIVE: D-Dimer, Quant: 1.28 ug/mL-FEU — ABNORMAL HIGH (ref 0.00–0.50)

## 2019-07-21 LAB — TROPONIN I (HIGH SENSITIVITY): Troponin I (High Sensitivity): 10 ng/L (ref <18)

## 2019-07-21 LAB — MAGNESIUM: Magnesium: 2.3 mg/dL (ref 1.7–2.4)

## 2019-07-21 LAB — LIPID PANEL
Cholesterol: 197 mg/dL (ref 0–200)
HDL: 82 mg/dL (ref >40)
LDL Cholesterol: 106 mg/dL — ABNORMAL HIGH (ref 0–99)
Total CHOL/HDL Ratio: 2.4 RATIO
Triglycerides: 44 mg/dL (ref <150)
VLDL: 9 mg/dL (ref 0–40)

## 2019-07-21 LAB — C-REACTIVE PROTEIN: CRP: 1.7 mg/dL — ABNORMAL HIGH (ref <1.0)

## 2019-07-21 LAB — LACTATE DEHYDROGENASE: LDH: 195 U/L — ABNORMAL HIGH (ref 98–192)

## 2019-07-21 LAB — FERRITIN: Ferritin: 9 ng/mL — ABNORMAL LOW (ref 11–307)

## 2019-07-21 LAB — GLUCOSE, CAPILLARY: Glucose-Capillary: 185 mg/dL — ABNORMAL HIGH (ref 70–99)

## 2019-07-21 LAB — FIBRINOGEN: Fibrinogen: 581 mg/dL — ABNORMAL HIGH (ref 210–475)

## 2019-07-21 LAB — TRIGLYCERIDES: Triglycerides: 96 mg/dL (ref <150)

## 2019-07-21 LAB — BRAIN NATRIURETIC PEPTIDE: B Natriuretic Peptide: 27.5 pg/mL (ref 0.0–100.0)

## 2019-07-21 LAB — LACTIC ACID, PLASMA: Lactic Acid, Venous: 1.5 mmol/L (ref 0.5–1.9)

## 2019-07-21 LAB — PROCALCITONIN: Procalcitonin: <0.1 ng/mL

## 2019-07-21 LAB — POC SARS CORONAVIRUS 2 AG -  ED: SARS Coronavirus 2 Ag: NEGATIVE

## 2019-07-21 MED ORDER — FUROSEMIDE 20 MG PO TABS: 80.0000 mg | ORAL_TABLET | Freq: Every day | ORAL | Status: DC

## 2019-07-21 MED ORDER — DICLOFENAC SODIUM 1 % EX GEL
2.0000 g | Freq: Four times a day (QID) | CUTANEOUS | Status: DC | PRN
  Administered 2019-07-22 – 2019-07-25 (×2): 2 g via TOPICAL
  Filled 2019-07-21: qty 100

## 2019-07-21 MED ORDER — CYCLOSPORINE 0.05 % OP EMUL
1.0000 [drp] | Freq: Two times a day (BID) | OPHTHALMIC | Status: DC
  Administered 2019-07-21 – 2019-07-25 (×8): 1 [drp] via OPHTHALMIC
  Filled 2019-07-21 (×9): qty 1

## 2019-07-21 MED ORDER — SODIUM CHLORIDE 0.9 % IV SOLN
1000.0000 mL | INTRAVENOUS | Status: DC
  Administered 2019-07-21: 16:00:00 1000 mL via INTRAVENOUS

## 2019-07-21 MED ORDER — POLYETHYLENE GLYCOL 3350 17 G PO PACK: 17.0000 g | PACK | Freq: Every day | ORAL | Status: DC | PRN

## 2019-07-21 MED ORDER — FLUTICASONE PROPIONATE 50 MCG/ACT NA SUSP
2.0000 | Freq: Every day | NASAL | Status: DC
  Administered 2019-07-22 – 2019-07-25 (×4): 2 via NASAL
  Filled 2019-07-21: qty 16

## 2019-07-21 MED ORDER — METOPROLOL TARTRATE 25 MG PO TABS: 12.5000 mg | ORAL_TABLET | Freq: Two times a day (BID) | ORAL | Status: DC

## 2019-07-21 MED ORDER — UMECLIDINIUM BROMIDE 62.5 MCG/INH IN AEPB
1.0000 | INHALATION_SPRAY | Freq: Every day | RESPIRATORY_TRACT | Status: DC
  Administered 2019-07-22: 1 via RESPIRATORY_TRACT
  Filled 2019-07-21: qty 7

## 2019-07-21 MED ORDER — POTASSIUM CHLORIDE 10 MEQ/100ML IV SOLN
10.0000 meq | Freq: Once | INTRAVENOUS | Status: DC
  Filled 2019-07-21: qty 100

## 2019-07-21 MED ORDER — POTASSIUM CHLORIDE CRYS ER 20 MEQ PO TBCR: 40.0000 meq | EXTENDED_RELEASE_TABLET | ORAL | Status: DC

## 2019-07-21 MED ORDER — METOPROLOL TARTRATE 5 MG/5ML IV SOLN: 5.0000 mg | Freq: Four times a day (QID) | INTRAVENOUS | Status: DC | PRN

## 2019-07-21 MED ORDER — POTASSIUM CHLORIDE 10 MEQ/100ML IV SOLN
10.0000 meq | INTRAVENOUS | Status: AC
  Administered 2019-07-21 – 2019-07-22 (×6): 10 meq via INTRAVENOUS
  Filled 2019-07-21 (×5): qty 100

## 2019-07-21 MED ORDER — AZELASTINE HCL 0.1 % NA SOLN
2.0000 | Freq: Every day | NASAL | Status: DC
  Administered 2019-07-22 – 2019-07-25 (×4): 2 via NASAL
  Filled 2019-07-21: qty 30

## 2019-07-21 MED ORDER — AEROCHAMBER PLUS FLO-VU MEDIUM MISC
1.0000 | Freq: Once | Status: AC
  Filled 2019-07-21: qty 1

## 2019-07-21 MED ORDER — TOPIRAMATE 25 MG PO TABS
25.0000 mg | ORAL_TABLET | Freq: Every day | ORAL | Status: DC
  Administered 2019-07-22 – 2019-07-25 (×4): 25 mg via ORAL
  Filled 2019-07-21 (×5): qty 1

## 2019-07-21 MED ORDER — LORATADINE 10 MG PO TABS
10.0000 mg | ORAL_TABLET | Freq: Every day | ORAL | Status: DC
  Administered 2019-07-22 – 2019-07-25 (×4): 10 mg via ORAL
  Filled 2019-07-21 (×4): qty 1

## 2019-07-21 MED ORDER — FAMOTIDINE 20 MG PO TABS
20.0000 mg | ORAL_TABLET | Freq: Every day | ORAL | Status: DC
  Administered 2019-07-22 – 2019-07-25 (×4): 20 mg via ORAL
  Filled 2019-07-21 (×4): qty 1

## 2019-07-21 MED ORDER — FLECAINIDE ACETATE 100 MG PO TABS
100.0000 mg | ORAL_TABLET | Freq: Two times a day (BID) | ORAL | Status: DC
  Filled 2019-07-21: qty 1

## 2019-07-21 MED ORDER — PANTOPRAZOLE SODIUM 40 MG PO TBEC
40.0000 mg | DELAYED_RELEASE_TABLET | Freq: Every day | ORAL | Status: DC
  Administered 2019-07-22 – 2019-07-25 (×4): 40 mg via ORAL
  Filled 2019-07-21 (×4): qty 1

## 2019-07-21 MED ORDER — IOHEXOL 350 MG/ML SOLN
75.0000 mL | Freq: Once | INTRAVENOUS | Status: AC | PRN
  Administered 2019-07-21: 21:00:00 75 mL via INTRAVENOUS

## 2019-07-21 MED ORDER — ALBUTEROL SULFATE (2.5 MG/3ML) 0.083% IN NEBU
2.5000 mg | INHALATION_SOLUTION | RESPIRATORY_TRACT | Status: DC | PRN
  Administered 2019-07-22 – 2019-07-24 (×5): 2.5 mg via RESPIRATORY_TRACT
  Filled 2019-07-21 (×6): qty 3

## 2019-07-21 MED ORDER — KETOTIFEN FUMARATE 0.025 % OP SOLN
1.0000 [drp] | Freq: Two times a day (BID) | OPHTHALMIC | Status: DC
  Administered 2019-07-21 – 2019-07-25 (×6): 1 [drp] via OPHTHALMIC
  Filled 2019-07-21: qty 5

## 2019-07-21 MED ORDER — ACETAMINOPHEN 325 MG PO TABS
650.0000 mg | ORAL_TABLET | Freq: Four times a day (QID) | ORAL | Status: DC | PRN
  Administered 2019-07-22 – 2019-07-25 (×6): 650 mg via ORAL
  Filled 2019-07-21 (×6): qty 2

## 2019-07-21 MED ORDER — ENOXAPARIN SODIUM 300 MG/3ML IJ SOLN
200.0000 mg | Freq: Two times a day (BID) | INTRAMUSCULAR | Status: DC
  Administered 2019-07-21: 22:00:00 200 mg via SUBCUTANEOUS
  Filled 2019-07-21 (×3): qty 2

## 2019-07-21 MED ORDER — AEROCHAMBER PLUS FLO-VU LARGE MISC
Status: AC
  Filled 2019-07-21: qty 1

## 2019-07-21 MED ORDER — IPRATROPIUM-ALBUTEROL 0.5-2.5 (3) MG/3ML IN SOLN
3.0000 mL | Freq: Once | RESPIRATORY_TRACT | Status: AC
  Administered 2019-07-21: 13:00:00 3 mL via RESPIRATORY_TRACT
  Filled 2019-07-21: qty 3

## 2019-07-21 MED ORDER — LORAZEPAM 2 MG/ML IJ SOLN
0.5000 mg | Freq: Once | INTRAMUSCULAR | Status: AC
  Administered 2019-07-21: 15:00:00 0.5 mg via INTRAVENOUS
  Filled 2019-07-21: qty 1

## 2019-07-21 MED ORDER — IPRATROPIUM-ALBUTEROL 0.5-2.5 (3) MG/3ML IN SOLN
3.0000 mL | RESPIRATORY_TRACT | Status: DC | PRN
  Administered 2019-07-22 – 2019-07-24 (×6): 3 mL via RESPIRATORY_TRACT
  Filled 2019-07-21 (×5): qty 3

## 2019-07-21 MED ORDER — VENLAFAXINE HCL ER 37.5 MG PO CP24
187.5000 mg | ORAL_CAPSULE | Freq: Every day | ORAL | Status: DC
  Administered 2019-07-22 – 2019-07-25 (×4): 187.5 mg via ORAL
  Filled 2019-07-21 (×5): qty 1

## 2019-07-21 MED ORDER — INSULIN ASPART 100 UNIT/ML ~~LOC~~ SOLN: 0.0000 [IU] | Freq: Every day | SUBCUTANEOUS | Status: DC

## 2019-07-21 MED ORDER — DILTIAZEM HCL ER COATED BEADS 120 MG PO CP24: 120.0000 mg | ORAL_CAPSULE | Freq: Every day | ORAL | Status: DC

## 2019-07-21 MED ORDER — POTASSIUM CHLORIDE 10 MEQ/100ML IV SOLN
10.0000 meq | Freq: Once | INTRAVENOUS | Status: AC
  Administered 2019-07-21: 15:00:00 10 meq via INTRAVENOUS
  Filled 2019-07-21: qty 100

## 2019-07-21 MED ORDER — TRAMADOL HCL 50 MG PO TABS
50.0000 mg | ORAL_TABLET | Freq: Two times a day (BID) | ORAL | Status: DC | PRN
  Administered 2019-07-23: 23:00:00 50 mg via ORAL
  Filled 2019-07-21: qty 1

## 2019-07-21 MED ORDER — INSULIN ASPART 100 UNIT/ML ~~LOC~~ SOLN
0.0000 [IU] | Freq: Three times a day (TID) | SUBCUTANEOUS | Status: DC
  Administered 2019-07-22 – 2019-07-24 (×3): 1 [IU] via SUBCUTANEOUS
  Administered 2019-07-24: 3 [IU] via SUBCUTANEOUS
  Administered 2019-07-24 – 2019-07-25 (×2): 1 [IU] via SUBCUTANEOUS

## 2019-07-21 MED ORDER — HYDROXYZINE HCL 25 MG PO TABS
25.0000 mg | ORAL_TABLET | Freq: Two times a day (BID) | ORAL | Status: DC | PRN
  Administered 2019-07-22 – 2019-07-25 (×3): 25 mg via ORAL
  Filled 2019-07-21 (×3): qty 1

## 2019-07-21 MED ORDER — ATORVASTATIN CALCIUM 40 MG PO TABS
40.0000 mg | ORAL_TABLET | Freq: Every day | ORAL | Status: DC
  Administered 2019-07-22 – 2019-07-25 (×4): 40 mg via ORAL
  Filled 2019-07-21 (×4): qty 1

## 2019-07-21 MED ORDER — MELATONIN 3 MG PO TABS
3.0000 mg | ORAL_TABLET | Freq: Every evening | ORAL | Status: DC | PRN
  Administered 2019-07-24 (×2): 3 mg via ORAL
  Filled 2019-07-21 (×3): qty 1

## 2019-07-21 MED ORDER — APIXABAN 5 MG PO TABS: 5.0000 mg | ORAL_TABLET | Freq: Two times a day (BID) | ORAL | Status: DC

## 2019-07-21 MED ORDER — MONTELUKAST SODIUM 10 MG PO TABS
10.0000 mg | ORAL_TABLET | Freq: Every day | ORAL | Status: DC
  Administered 2019-07-22 – 2019-07-24 (×3): 10 mg via ORAL
  Filled 2019-07-21 (×3): qty 1

## 2019-07-21 MED ORDER — FUROSEMIDE 10 MG/ML IJ SOLN
80.0000 mg | Freq: Once | INTRAMUSCULAR | Status: AC
  Administered 2019-07-21: 19:00:00 80 mg via INTRAVENOUS
  Filled 2019-07-21: qty 8

## 2019-07-21 MED ORDER — KETOTIFEN FUMARATE 0.025 % OP SOLN: 2.0000 [drp] | Freq: Two times a day (BID) | OPHTHALMIC | Status: DC

## 2019-07-21 MED ORDER — MOMETASONE FURO-FORMOTEROL FUM 100-5 MCG/ACT IN AERO
2.0000 | INHALATION_SPRAY | Freq: Two times a day (BID) | RESPIRATORY_TRACT | Status: DC
  Administered 2019-07-22 (×2): 2 via RESPIRATORY_TRACT
  Filled 2019-07-21: qty 8.8

## 2019-07-21 MED ORDER — ALBUTEROL SULFATE HFA 108 (90 BASE) MCG/ACT IN AERS
4.0000 | INHALATION_SPRAY | Freq: Once | RESPIRATORY_TRACT | Status: AC
  Administered 2019-07-21: 12:00:00 4 via RESPIRATORY_TRACT
  Filled 2019-07-21: qty 6.7

## 2019-07-21 MED ORDER — ALBUTEROL SULFATE HFA 108 (90 BASE) MCG/ACT IN AERS
2.0000 | INHALATION_SPRAY | RESPIRATORY_TRACT | Status: DC | PRN
  Administered 2019-07-21: 15:00:00 2 via RESPIRATORY_TRACT
  Filled 2019-07-21: qty 6.7

## 2019-07-21 MED ORDER — POTASSIUM CHLORIDE 10 MEQ/100ML IV SOLN
10.0000 meq | Freq: Once | INTRAVENOUS | Status: AC
  Administered 2019-07-21: 18:00:00 10 meq via INTRAVENOUS

## 2019-07-21 NOTE — ED Provider Notes (Signed)
.  Critical Care Performed by: Elnora [name], MD Authorized by: Elnora [name], MD   Critical care provider statement:    Critical care time (minutes):  40   Critical care start time:  07/21/2019 5:00 PM   Critical care end time:  07/21/2019 5:40 PM   Critical care time was exclusive of:  Separately billable procedures and treating other patients and teaching time   Critical care was necessary to treat or prevent imminent or life-threatening deterioration of the following conditions:  Respiratory failure   Critical care was time spent personally by me on the following activities:  Evaluation of patient's response to treatment, examination of patient, ordering and performing treatments and interventions, ordering and review of laboratory studies, ordering and review of radiographic studies, pulse oximetry, re-evaluation of patient's condition, review of old charts and discussions with primary provider   Patient being admitted for respiratory failure, patient is DNR and DNI.  Patient was having increased work of breathing and adjustments required to BiPAP settings.  Clarified with patient and she is DNI.  IV potassium ordered, ABG to assess respiratorystatus.  Updated family medicine on severity of patient's clinical condition at this time. Pt on BIPAP on assessment, tachycardia 118, alert, mild sedate.     Elnora [name], MD 07/21/19 1750

## 2019-07-21 NOTE — Progress Notes (Addendum)
Pt assessed by RT for possible need for bi-pap. POC negative, rapid results still pending. Pt is ventilating well with clear BBS, diminished in bases. RR 18-22, HR 100-110, SATs 94-100% on 8L HFNC. Pt is able to speak in full sentences but feels SOB at rest. She has a strong/dry cough with complaints of chest pain but states that her breathing is starting to feel better. RT will continue to monitor and will hold off on bi-pap at this time pending COVID results. MD aware.

## 2019-07-21 NOTE — Progress Notes (Signed)
ANTICOAGULATION CONSULT NOTE - Initial Consult  Pharmacy Consult for enoxaparin Indication: holding chronic anticoagulation for prior DVT  Allergies  Allergen Reactions  . Caffeine Other (See Comments)    Migraine  . Hydralazine Hcl     Other reaction(s): Other (See Comments) AKI leading to rhabdo and electrolyte abnormalities  . Hydrocodone Nausea And Vomiting    Headache  . Ciprofloxacin Hives and Rash  . Erythromycin Hives and Rash  . Lisinopril Cough  . Oxycodone Nausea And Vomiting    Headache  . Sulfamethoxazole-Trimethoprim Rash    Patient Measurements: Height: 5\' 5"  (165.1 cm) Weight: (!) 440 lb 14.7 oz (200 kg) IBW/kg (Calculated) : 57  Vital Signs: Temp: 99 F (37.2 C) (01/24 1850) Temp Source: Axillary (01/24 1850) BP: 126/78 (01/24 1800) Pulse Rate: 115 (01/24 1800)  Labs: Recent Labs    07/21/19 1148 07/21/19 1726  HGB 9.4* 11.6*  HCT 33.1* 34.0*  PLT 564*  --   CREATININE 0.53  --     Estimated Creatinine Clearance: 128.1 mL/min (by C-G formula based on SCr of 0.53 mg/dL).   Medical History: Past Medical History:  Diagnosis Date  . Abdominal wall hematoma 03/06/2019  . Acute bronchitis 08/29/2018  . Acute on chronic respiratory failure with hypoxia ([place]) 08/29/2018  . Anxiety   . Asthma   . Chronic lower back pain   . COPD (chronic obstructive pulmonary disease) ([place])   . Depression   . Exposure to COVID-19 virus 11/02/2018  . Fall 02/2019  . Family history of anesthesia complication    "daughter; causes her to pass out afterwards"  . GERD (gastroesophageal reflux disease)   . History of atrial flutter 06/26/2015  . History of pulmonary embolus (PE) 11/03/2014  . Hypertension   . Hyperthyroidism   . Left medial tibial stress syndrome 12/26/2017  . Lower GI bleed   . Migraine    "monthly" (12/28/2013)  . Non-traumatic rhabdomyolysis 11/03/2014  . Obesity hypoventilation syndrome ([place]) 03/06/2019  . Osteoarthritis    "both knees; back of my neck;  right pelvic bone" (12/28/2013)  . Paroxysmal A-fib ([place])   . Pulmonary embolism ([place]) 12/28/2013   "2 clots in each lung"    Medications:  Scheduled:  . atorvastatin  40 mg Oral q1800  . azelastine  2 spray Each Nare Daily  . cycloSPORINE  1 drop Both Eyes BID  . [START ON 07/22/2019] famotidine  20 mg Oral Daily  . fluticasone  2 spray Each Nare Daily  . furosemide  80 mg Intravenous Once  . insulin aspart  0-5 Units Subcutaneous QHS  . [START ON 07/22/2019] insulin aspart  0-9 Units Subcutaneous TID WC  . ketotifen  1 drop Both Eyes BID  . [START ON 07/22/2019] loratadine  10 mg Oral Daily  . mometasone-formoterol  2 puff Inhalation BID  . montelukast  10 mg Oral QHS  . [START ON 07/22/2019] pantoprazole  40 mg Oral Daily  . topiramate  25 mg Oral Daily  . umeclidinium bromide  1 puff Inhalation Daily  . [START ON 07/22/2019] venlafaxine XR  187.5 mg Oral Q breakfast   Infusions:  . potassium chloride      Assessment: 64 yo F on apixaban 5 mg BID PTA for prior DVT. Pt is now NPO while on bipap and pharmacy has been consulted to dose enoxaparin while apixaban is on hold.   Goal of Therapy:  LMWH level 0.6-1 Monitor platelets by anticoagulation protocol: Yes   Plan:  Enoxaparin 200  mg [place] q12h Will check LMWH levels once steady state achieved  Monitor for bleeding and changes in weight and renal function  Vertis Kelch, PharmD, [facility] PGY2 Cardiology Pharmacy Resident Phone [phone] 07/21/2019       7:04 PM  Please check [email or web address] for unit-specific pharmacist phone numbers

## 2019-07-21 NOTE — H&P (Addendum)
[facility] Admission History and Physical Service Pager: [phone]  Patient name: [name] White Medical record number: [id number] Date of birth: [birth year] Age: 64 y.o. Gender: female  Primary Care Provider: [name], MD Consultants: None Code Status: DNR Preferred Emergency Contact: Niece, [phone]  Chief Complaint: Shortness of breath  Assessment and Plan: [name] White is a 64 y.o. female presenting with shortness of breath. PMH is significant for vocal cord dysfunction status post partial thyroidectomy, COPD/asthma, hypertension, history of PE, A. fib on Eliquis, anxiety, depression, chronic back pain, GERD, falls.  Acute hypoxic respiratory failure Patient recently discharged on Trilogy BiPAP with home oxygen requirement of around 4 L.  Currently on 8 L high flow nasal cannula with increased shortness of breath.  Labs on admission significant for potassium of 2.4, LDH 195, negative procalcitonin, white blood cell count elevated 11.0.  Patient also with CRP of 1.7, lactic acid 1.5, ferritin of 9.  D-dimer on admission of 1.28 with fibrinogen of 581.  Possible etiologies for hypoxic respiratory failure include infection, COPD exacerbation, CHF exacerbation, or noncompliance or other issue with home trilogy. Chest x-ray showed bibasilar airspace disease which could be reflective of atelectasis versus pneumonia.  Patient does endorse that her oxygen concentrator does not seem to work appropriately and that she felt but lately has only been doing it very small amount of air out and sometimes intermittently which she attributes to her symptoms.  Reassured as patient is Covid negative and flu negative.  Patient does have a history of heart failure which could be contributing in someway to her acute proximal respiratory failure. -Admit to cardiac telemetry, attending Dr. Ardelia Mems -Oxygen as needed to maintain sats greater than 90% -Follow-up on  blood cultures -A.m. CBC/BMP -Follow-up respiratory viral panel -We will check BNP -PT/OT -Two-view chest x-ray -Strict I's and O's -Daily weights -Monitor fluid status -We will check high-sensitivity troponin -lower extremity dopplers bilaterally   Hypokalemia Patient with potassium on admission of 2.4.  Status post 1 run IV potassium.  Will give 40 K-Dur x2 and recheck BMP this evening. -bmp @ 22:00 -Daily BMP -Replace potassium as needed  HFpEF Most recent ECHO 12/02, LVEF 60-65%, normal LV function, severe increased LV hypertrophy.  Home medication Lasix 80 mg daily. Chest xray w/o pulmonary edema. BNP 88.3  On exam patient appears slightly volume overloaded with 2+ pitting edema, hard to determine, but may be dependant due to limited mobility. If elevated BNP, consider IV lasix. Overall, does not appear to be in heart failure.  -BNP -Continue home medication -daily weights -strict intake and output  Chest pressure Patient with complaint of chest pressure during both this and recent admission.  During last admission had cardiac work-up and was thought the chest pressure was related to anxiety and respiratory distress as well as GERD and some capacity.   -We will check high-sensitivity troponin  OSA/OHS Patient with trilogy at home. -BiPAP if Covid negative -consider sleep study outpatient  Vocal Cord Dysfunction S/p partial thyroidectomy  Paroxysmal AFIB Home medication Digoxin 120 mg qhs, Metoprolol 12.5 mg BID, Flecanide 100 mg BID.  Per prior notes patient may have a propensity to occasionally flip into A. fib which could increase her chest pressure/anxiety sensations. -Continue home medication  Diabetes Patient with recent diagnosis of diabetes during last admission in December.  HbA1C at that time of 7.8.  No home medications -start sss insulin -monitor CBG -We will start statin -Check lipid panel  History of pulmonary  embolism Home medication Eliquis.   Geneva score 8, moderate risk of PE.   -Continue Eliquis 5mg  BID -We will check lower limb Dopplers.  HTN Current BP 127/72. Home medication, Metopriolol, Cardizem, Lasix -Continue home medication  Morbid Obesity Current weight of 200 kg, BMI 73.  Recent weight on discharge last hospitalization earlier this month of 199 kg. -encourage healthy lifestyle and weight management  ?COPD/Asthma vs. Vocal Cord dysfunction Recent PFT's show no evidence of COPD/Asthma. Possibly vocal cord dis On home oxygen 6L.  Followed by Dr.Byrum, Pulmonology.  Missed last appointment.  Home medications Montelukast, Albuterol, Duo-nebs, Dulera. Was followed by Pulmonology, but medications were not discontinued. Recommend avoiding systemic corticosteroids as this may worsen vocal cord dysfunction. -Outpatient Pulmonology vs. ENT/SLP follow up -continue home medications -monitor resp status   MDD/Anxiety Patient with a history of anxiety Home medications include Atarax 25 mg, Effexor 187.5 mg daily  -Continue home medications  GERD Home meds Prilosec 20 mg daily and Pepcid 20 mg.  -continue home meds   FEN/GI: Heart healthy/carb modified Prophylaxis: Eliquis  Disposition: Admit to cardiac telemetry  History of Present Illness:  [name] White is a 64 y.o. female presenting with shortness of breath. Patient states that upon her recent discharge with her new trilogy she was doing well however she feels that her oxygen concentrator did not seem to be able to keep up with this.  She said that sometimes it seems that it was not going much air and other times it seemed intermittent.  She states that for the past 3 days she has been progressively more short of breath with some coughing but without feelings of fever.  She attributes this to her air concentrator.  Patient also has complaints of some chest pressure which she states feels similar to normal for her.  She also states that her lower legs hurt with  her left hurting greater than the right but that this also feels normal for her.  Review Of Systems: Per HPI with the following additions:   Review of Systems  Constitutional: Negative for chills and fever.  HENT: Negative for sore throat.   Eyes: Negative for blurred vision.  Respiratory: Positive for cough.   Cardiovascular: Positive for chest pain.  Gastrointestinal: Positive for constipation. Negative for abdominal pain and vomiting.  Genitourinary: Negative for dysuria.  Neurological: Positive for headaches.    Patient Active Problem List   Diagnosis Date Noted  . Acute hypoxemic respiratory failure ([place]) 07/21/2019  . Hypertension associated with diabetes ([place])   . Fever   . COPD (chronic obstructive pulmonary disease) ([place]) 06/21/2019  . Community acquired pneumonia 06/14/2019  . Weight gain due to medication 06/05/2019  . Acute respiratory failure with hypoxia ([place]) 05/28/2019  . Bronchitis with acute wheezing 05/09/2019  . Hyperglycemia 03/19/2019  . Acute on chronic respiratory failure with hypoxemia ([place]) 03/14/2019  . Acute on chronic heart failure with preserved ejection fraction ([place]) 03/14/2019  . Acquired thrombophilia ([place]) due to A-Fib 03/07/2019  . Heart failure with preserved ejection fraction ([place]) 03/06/2019  . Obesity hypoventilation syndrome ([place]) 03/06/2019  . Lower GI bleed   . Urge incontinence 01/27/2019  . COPD exacerbation ([place]) 08/29/2018  . COPD with acute exacerbation ([place]) 08/07/2018  . OSA (obstructive sleep apnea) 07/05/2018  . Respiratory failure ([place]) 07/05/2018  . Severe episode of recurrent major depressive disorder, without psychotic features ([place])   . MDD (major depressive disorder), recurrent episode, severe ([place]) 09/07/2015  . Atrial flutter ([place]) 07/29/2015  .  Asthma 11/12/2014  . History of hypertension 11/05/2014  . Paroxysmal atrial fibrillation ([place]) 11/03/2014  . Chronic anticoagulation 11/03/2014  . Major depressive  disorder 11/03/2014  . History of pulmonary embolus (PE) 11/03/2014  . MDD (major depressive disorder), recurrent severe, without psychosis ([place]) 03/05/2014  . Vocal cord dysfunction 11/12/2013  . Nonintractable headache 01/06/2012  . Hot flashes 04/22/2011  . OVERACTIVE BLADDER 04/18/2008  . Osteoarthritis of both knees 04/18/2008  . Osteoarthritis involving multiple joints on both sides of body 09/14/2007  . Morbid obesity ([place]) 08/24/2006  . RESTLESS LEGS SYNDROME 08/24/2006  . HYPERTENSION, BENIGN SYSTEMIC 08/24/2006  . RHINITIS, ALLERGIC 08/24/2006  . GASTROESOPHAGEAL REFLUX, NO ESOPHAGITIS 08/24/2006    Past Medical History: Past Medical History:  Diagnosis Date  . Abdominal wall hematoma 03/06/2019  . Acute bronchitis 08/29/2018  . Acute on chronic respiratory failure with hypoxia ([place]) 08/29/2018  . Anxiety   . Asthma   . Chronic lower back pain   . COPD (chronic obstructive pulmonary disease) ([place])   . Depression   . Exposure to COVID-19 virus 11/02/2018  . Fall 02/2019  . Family history of anesthesia complication    "daughter; causes her to pass out afterwards"  . GERD (gastroesophageal reflux disease)   . History of atrial flutter 06/26/2015  . History of pulmonary embolus (PE) 11/03/2014  . Hypertension   . Hyperthyroidism   . Left medial tibial stress syndrome 12/26/2017  . Lower GI bleed   . Migraine    "monthly" (12/28/2013)  . Non-traumatic rhabdomyolysis 11/03/2014  . Obesity hypoventilation syndrome ([place]) 03/06/2019  . Osteoarthritis    "both knees; back of my neck; right pelvic bone" (12/28/2013)  . Paroxysmal A-fib ([place])   . Pulmonary embolism ([place]) 12/28/2013   "2 clots in each lung"    Past Surgical History: Past Surgical History:  Procedure Laterality Date  . ABDOMINAL HYSTERECTOMY    . APPENDECTOMY    . BREAST CYST EXCISION Right   . DILATION AND CURETTAGE OF UTERUS    . ELECTROPHYSIOLOGIC STUDY N/A 05/27/2015   Procedure: A-Flutter;  Surgeon: Evans Lance, MD;  Location: [facility];  Service: Cardiovascular;  Laterality: N/A;  . EXCISIONAL HEMORRHOIDECTOMY    . NASAL SINUS SURGERY  2007  . THYROIDECTOMY, PARTIAL Right 2005  . TUBAL LIGATION    . WISDOM TOOTH EXTRACTION      Social History: Social History   Tobacco Use  . Smoking status: Former Smoker    Packs/day: 0.25    Years: 20.00    Pack years: 5.00    Types: Cigarettes    Quit date: 07/17/1999    Years since quitting: 20.0  . Smokeless tobacco: Never Used  Substance Use Topics  . Alcohol use: Not Currently    Comment: "drank some in my 30's"  . Drug use: No   Additional social history: None Please also refer to relevant sections of EMR.  Family History: Family History  Problem Relation Age of Onset  . Osteoarthritis Mother   . Asthma Mother   . Heart failure Mother   . Breast cancer Daughter    Allergies and Medications: Allergies  Allergen Reactions  . Caffeine Other (See Comments)    Migraine  . Hydralazine Hcl     Other reaction(s): Other (See Comments) AKI leading to rhabdo and electrolyte abnormalities  . Hydrocodone Nausea And Vomiting    Headache  . Ciprofloxacin Hives and Rash  . Erythromycin Hives and Rash  . Lisinopril Cough  .  Oxycodone Nausea And Vomiting    Headache  . Sulfamethoxazole-Trimethoprim Rash   No current facility-administered medications on file prior to encounter.   Current Outpatient Medications on File Prior to Encounter  Medication Sig Dispense Refill  . acetaminophen (TYLENOL) 325 MG tablet Take 2 tablets (650 mg total) by mouth every 6 (six) hours as needed for mild pain.    Marland Kitchen apixaban (ELIQUIS) 5 MG TABS tablet Take 1 tablet (5 mg total) by mouth 2 (two) times daily. 60 tablet 0  . azelastine (ASTELIN) 0.1 % nasal spray Place 2 sprays into both nostrils 2 (two) times daily. Use in each nostril as directed (Patient taking differently: Place 2 sprays into both nostrils daily. ) 30 mL 0  . azelastine (OPTIVAR)  0.05 % ophthalmic solution Place 1 drop into both eyes 2 (two) times daily as needed (itchy eyes).    . bacitracin ointment Apply topically 2 (two) times daily. 120 g 0  . cycloSPORINE (RESTASIS) 0.05 % ophthalmic emulsion Place 1 drop into both eyes 2 (two) times daily.    . diclofenac sodium (VOLTAREN) 1 % GEL APPLY 2 GRAMS EXTERNALLY TO THE AFFECTED AREA FOUR TIMES DAILY (Patient taking differently: Apply 2 g topically 4 (four) times daily as needed (arthritis pain - knees, shoulder and thighs). ) 100 g 0  . diltiazem (CARDIZEM CD) 120 MG 24 hr capsule Take 1 capsule (120 mg total) by mouth daily. (Patient taking differently: Take 120 mg by mouth at bedtime. ) 90 capsule 0  . famotidine (PEPCID) 20 MG tablet Take 1 tablet (20 mg total) by mouth daily. 90 tablet 0  . flecainide (TAMBOCOR) 100 MG tablet TAKE 1 TABLET BY MOUTH 2 TIMES A DAY. PLEASE KEEP UPCOMING APPOINTMENT IN JANUARY BEFORE ANYMORE REFILLS. (Patient taking differently: Take 100 mg by mouth 2 (two) times daily. ) 60 tablet 6  . fluticasone (FLONASE) 50 MCG/ACT nasal spray Place 2 sprays into both nostrils daily.     . furosemide (LASIX) 80 MG tablet Take 1 tablet (80 mg total) by mouth daily. 30 tablet 0  . hydrOXYzine (VISTARIL) 25 MG capsule Take 1 capsule (25 mg total) by mouth 2 (two) times daily as needed for anxiety. 30 capsule 0  . ipratropium-albuterol (DUONEB) 0.5-2.5 (3) MG/3ML SOLN Take 3 mLs by nebulization every 12 (twelve) hours as needed (shortness of breath, wheezing). 360 mL 3  . loratadine (CLARITIN) 10 MG tablet TAKE 1 TABLET(10 MG) BY MOUTH DAILY (Patient taking differently: Take 10 mg by mouth daily. ) 90 tablet 0  . Melatonin 3 MG TABS Take 1 tablet (3 mg total) by mouth at bedtime as needed (sleep). 30 tablet 0  . metoprolol tartrate (LOPRESSOR) 25 MG tablet Take 0.5 tablets (12.5 mg total) by mouth 2 (two) times daily. 60 tablet 0  . mometasone-formoterol (DULERA) 100-5 MCG/ACT AERO Inhale 2 puffs into the lungs  2 (two) times daily. 13 g 0  . montelukast (SINGULAIR) 10 MG tablet Take 1 tablet (10 mg total) by mouth at bedtime. 30 tablet 0  . omeprazole (PRILOSEC) 20 MG capsule Take 1 capsule (20 mg total) by mouth daily. 30 capsule 0  . OXYGEN Inhale 3 L into the lungs continuous.     . potassium chloride SA (KLOR-CON) 20 MEQ tablet Take 2 tablets (40 mEq total) by mouth 2 (two) times daily. 120 tablet 0  . topiramate (TOPAMAX) 25 MG tablet TAKE 1 TABLET(25 MG) BY MOUTH DAILY (Patient taking differently: Take 25 mg by mouth daily. )  90 tablet 0  . traMADol (ULTRAM) 50 MG tablet Take 1 tablet (50 mg total) by mouth every 12 (twelve) hours as needed (breakthrough pain. Must have tried Tylenol first.). 30 tablet 0  . umeclidinium bromide (INCRUSE ELLIPTA) 62.5 MCG/INH AEPB Inhale 1 puff into the lungs daily. 30 each 0  . venlafaxine (EFFEXOR) 37.5 MG tablet Take 37.5 mg by mouth daily with breakfast. Take with a 150 mg ER capsule every morning    . venlafaxine XR (EFFEXOR-XR) 150 MG 24 hr capsule Take 1 capsule (150 mg total) by mouth daily with breakfast. Take 150mg  capsule with 37.5mg  tablet (Patient taking differently: Take 150 mg by mouth daily with breakfast. Take with a 37.5 mg tablet every morning)    . VENTOLIN HFA 108 (90 Base) MCG/ACT inhaler INHALE 2 PUFFS INTO THE LUNGS EVERY 6 HOURS AS NEEDED FOR WHEEZING OR SHORTNESS OF BREATH (Patient taking differently: Inhale 2 puffs into the lungs every 6 (six) hours as needed for wheezing or shortness of breath. ) 18 g 0  . albuterol (PROVENTIL) (2.5 MG/3ML) 0.083% nebulizer solution Take 3 mLs (2.5 mg total) by nebulization every 6 (six) hours as needed for wheezing or shortness of breath. (Patient not taking: Reported on 06/15/2019) 75 mL 4  . guaiFENesin-dextromethorphan (ROBITUSSIN DM) 100-10 MG/5ML syrup Take 5 mLs by mouth every 4 (four) hours as needed for cough. (Patient not taking: Reported on 07/21/2019) 118 mL 0    Objective: BP 127/72   Pulse (!)  114   Temp 98.9 F (37.2 C) (Oral)   Resp 20   Ht 5\' 5"  (1.651 m)   Wt (!) 200 kg   SpO2 97%   BMI 73.37 kg/m  Exam: General: Alert and oriented, some increased work of breathing on 10 L nasal cannula  Eyes: PERRLA ENTM: No pharyengeal erythema Cardiovascular: S1, S2 with no murmurs noted Respiratory: Some wheezing noted bilaterally, breath sounds faint and difficult to auscultate Gastrointestinal: Bowel sounds present. No abdominal pain Lower extremity: Tenderness to palpation bilaterally with patient stating left seems to hurt more than the right. 1-2 pitting edema Derm: No rashes noted Neuro: No obvious focal deficits Psych: Behavior and speech appropriate to situation   Labs and Imaging: CBC BMET  Recent Labs  Lab 07/21/19 1148  WBC 11.0*  HGB 9.4*  HCT 33.1*  PLT 564*   Recent Labs  Lab 07/21/19 1148  NA 142  K 2.4*  CL 100  CO2 31  BUN 9  CREATININE 0.53  GLUCOSE 107*  CALCIUM 8.7Lurline Del, DO 07/21/2019, 5:15 PM PGY-1, [place] Intern pager: [phone], text pages welcome  [place]   I have seen and examined this patient.    I have discussed the findings and exam with the intern and agree with the above note, which I have edited appropriately in [name]. I helped develop the management plan that is described in the resident's note, and I agree with the content.   Marny Lowenstein, MD, MS FAMILY MEDICINE RESIDENT - PGY3 07/21/2019 6:24 PM

## 2019-07-21 NOTE — Progress Notes (Signed)
FPTS Interim Progress Note  PM check on patient with Dr. Rosita Fire.  Patient was resting comfortably in bed with BiPAP in place.  Patient reports that she feels improved.  States her breathing is better.  States she has some chest tightness but this is the same as she has chronically had.  No acute changes.  Troponin was ordered and was negative (10).  No complaints at this time.  O: BP 117/68 (BP Location: Right Arm)   Pulse (!) 114   Temp 98.5 F (36.9 C) (Oral)   Resp 17   Ht 5\' 5"  (1.651 m)   Wt (!) 200 kg   SpO2 94%   BMI 73.37 kg/m   Gen: resting comfortably, easily awakes to voice, NAD Cardio: regular rhythm, tachycardia, no MRG Resp: mild expiratory wheezes, Bipap in place, good air movement, no accessory muscle use, able to speak full sentences  A/P: Acute hypoxic respiratory failure Stable on bipap. Patient is DNR. Reports subjective improvement since being on her bipap machine. Differentials include non compliance on home bipap due to equipment failure. Patient with recent PE on eliquis. Does have new tachycardia as well as respiratory distress. Can consider new PE. CTA was ordered this afternoon as well as LE dopplers, both are pending. Previously on Eliquis and currently on therapeutic lovenox.  -monitor respiratory status -CTA pending -LE doppler pending  -continuous pulse ox   Tachycardia HR 119 while in room. Reports no change in chest pain from chronic pain. CTA pending as well as LE dopplers. Has PRN metoprolol on MAR. EKG without STEMI. High sensitivity troponin negative.  -continue to monitor -metoprolol 5mg  q6h PRN  Caroline More, DO 07/21/2019, 8:36 PM PGY-3, [place] Service pager [phone]

## 2019-07-21 NOTE — ED Triage Notes (Signed)
Pt arrives via EMS from home with resp distress. 86% on 5L, 10 albulterol, 1 atrovent, 2g mag, 125 solu-medrol, .3 epi IM given by EMS. Bilateral lower leg edema. Hx of COPD and CHF. NRB 10L on arrival.

## 2019-07-21 NOTE — Progress Notes (Signed)
Received pt from ED on 10L of O2 via nasal cannula, NS running at 125.Per pt, pt's work of breathing has gotten worse since she left ED room. Noted some fine crackles in bilateral lungs. RN put pt on non-rebreather, RT and MD notified.  RT to bring BiPAP to the room. Verbal order stop fluid and give laisx 1 time dose. Will continue to monitor.

## 2019-07-21 NOTE — ED Provider Notes (Addendum)
[facility] EMERGENCY DEPARTMENT Provider Note   CSN: [id number] Arrival date & time: 07/21/19  1143     History Chief Complaint  Patient presents with  . Shortness of Breath    [name] White is a 64 y.o. female.  Pt presents to the ED today with sob.  Pt has a hx of severe COPD.  She was hospitalized from 12/25-1/10 for a COPD exacerbation.  She was d/c home on a trilogy ventilator and was initially doing well, however, she became more sob for the last 2 days.  Pt is on 5L oxygen via [place] all the time.  She said her O2 sats were 86% on 5L.  EMS was called who gave her 125 mg solumedrol IV, 2 g mag, 0.3 mg epi, and albuterol/atrovent.  She was placed on 10L oxygen via facemask.  She has improved somewhat, but still feels very sob.        Past Medical History:  Diagnosis Date  . Abdominal wall hematoma 03/06/2019  . Acute bronchitis 08/29/2018  . Acute on chronic respiratory failure with hypoxia ([place]) 08/29/2018  . Anxiety   . Asthma   . Chronic lower back pain   . COPD (chronic obstructive pulmonary disease) ([place])   . Depression   . Exposure to COVID-19 virus 11/02/2018  . Fall 02/2019  . Family history of anesthesia complication    "daughter; causes her to pass out afterwards"  . GERD (gastroesophageal reflux disease)   . History of atrial flutter 06/26/2015  . History of pulmonary embolus (PE) 11/03/2014  . Hypertension   . Hyperthyroidism   . Left medial tibial stress syndrome 12/26/2017  . Lower GI bleed   . Migraine    "monthly" (12/28/2013)  . Non-traumatic rhabdomyolysis 11/03/2014  . Obesity hypoventilation syndrome ([place]) 03/06/2019  . Osteoarthritis    "both knees; back of my neck; right pelvic bone" (12/28/2013)  . Paroxysmal A-fib ([place])   . Pulmonary embolism ([place]) 12/28/2013   "2 clots in each lung"    Patient Active Problem List   Diagnosis Date Noted  . Hypertension associated with diabetes ([place])   . Fever   . COPD (chronic obstructive pulmonary  disease) ([place]) 06/21/2019  . Community acquired pneumonia 06/14/2019  . Weight gain due to medication 06/05/2019  . Acute respiratory failure with hypoxia ([place]) 05/28/2019  . Bronchitis with acute wheezing 05/09/2019  . Hyperglycemia 03/19/2019  . Acute on chronic respiratory failure with hypoxemia ([place]) 03/14/2019  . Acute on chronic heart failure with preserved ejection fraction ([place]) 03/14/2019  . Acquired thrombophilia ([place]) due to A-Fib 03/07/2019  . Heart failure with preserved ejection fraction ([place]) 03/06/2019  . Obesity hypoventilation syndrome ([place]) 03/06/2019  . Lower GI bleed   . Urge incontinence 01/27/2019  . COPD exacerbation ([place]) 08/29/2018  . COPD with acute exacerbation ([place]) 08/07/2018  . OSA (obstructive sleep apnea) 07/05/2018  . Respiratory failure ([place]) 07/05/2018  . Severe episode of recurrent major depressive disorder, without psychotic features ([place])   . MDD (major depressive disorder), recurrent episode, severe ([place]) 09/07/2015  . Atrial flutter ([place]) 07/29/2015  . Asthma 11/12/2014  . History of hypertension 11/05/2014  . Paroxysmal atrial fibrillation ([place]) 11/03/2014  . Chronic anticoagulation 11/03/2014  . Major depressive disorder 11/03/2014  . History of pulmonary embolus (PE) 11/03/2014  . MDD (major depressive disorder), recurrent severe, without psychosis ([place]) 03/05/2014  . Vocal cord dysfunction 11/12/2013  . Nonintractable headache 01/06/2012  . Hot flashes 04/22/2011  . OVERACTIVE  BLADDER 04/18/2008  . Osteoarthritis of both knees 04/18/2008  . Osteoarthritis involving multiple joints on both sides of body 09/14/2007  . Morbid obesity ([place]) 08/24/2006  . RESTLESS LEGS SYNDROME 08/24/2006  . HYPERTENSION, BENIGN SYSTEMIC 08/24/2006  . RHINITIS, ALLERGIC 08/24/2006  . GASTROESOPHAGEAL REFLUX, NO ESOPHAGITIS 08/24/2006    Past Surgical History:  Procedure Laterality Date  . ABDOMINAL HYSTERECTOMY    . APPENDECTOMY    . BREAST CYST  EXCISION Right   . DILATION AND CURETTAGE OF UTERUS    . ELECTROPHYSIOLOGIC STUDY N/A 05/27/2015   Procedure: A-Flutter;  Surgeon: Evans Lance, MD;  Location: [facility];  Service: Cardiovascular;  Laterality: N/A;  . EXCISIONAL HEMORRHOIDECTOMY    . NASAL SINUS SURGERY  2007  . THYROIDECTOMY, PARTIAL Right 2005  . TUBAL LIGATION    . WISDOM TOOTH EXTRACTION       OB History   No obstetric history on file.     Family History  Problem Relation Age of Onset  . Osteoarthritis Mother   . Asthma Mother   . Heart failure Mother   . Breast cancer Daughter     Social History   Tobacco Use  . Smoking status: Former Smoker    Packs/day: 0.25    Years: 20.00    Pack years: 5.00    Types: Cigarettes    Quit date: 07/17/1999    Years since quitting: 20.0  . Smokeless tobacco: Never Used  Substance Use Topics  . Alcohol use: Not Currently    Comment: "drank some in my 30's"  . Drug use: No    Home Medications Prior to Admission medications   Medication Sig Start Date End Date Taking? Authorizing Provider  acetaminophen (TYLENOL) 325 MG tablet Take 2 tablets (650 mg total) by mouth every 6 (six) hours as needed for mild pain. 09/11/18   Georgette Shell, MD  albuterol (PROVENTIL) (2.5 MG/3ML) 0.083% nebulizer solution Take 3 mLs (2.5 mg total) by nebulization every 6 (six) hours as needed for wheezing or shortness of breath. Patient not taking: Reported on 06/15/2019 07/09/18   Collene Gobble, MD  apixaban (ELIQUIS) 5 MG TABS tablet Take 1 tablet (5 mg total) by mouth 2 (two) times daily. 03/08/19   Kathrene Alu, MD  azelastine (ASTELIN) 0.1 % nasal spray Place 2 sprays into both nostrils 2 (two) times daily. Use in each nostril as directed Patient taking differently: Place 2 sprays into both nostrils daily.  06/01/19   Lattie Haw, MD  azelastine (OPTIVAR) 0.05 % ophthalmic solution Place 1 drop into both eyes 2 (two) times daily as needed (itchy eyes).     [provider]  bacitracin ointment Apply topically 2 (two) times daily. 06/27/19   Caroline More, DO  cyclobenzaprine (FLEXERIL) 10 MG tablet Take 10 mg by mouth every 8 (eight) hours as needed for muscle spasms.    [provider]  cycloSPORINE (RESTASIS) 0.05 % ophthalmic emulsion Place 1 drop into both eyes 2 (two) times daily.    [provider]  diclofenac sodium (VOLTAREN) 1 % GEL APPLY 2 GRAMS EXTERNALLY TO THE AFFECTED AREA FOUR TIMES DAILY Patient taking differently: Apply 2 g topically 4 (four) times daily as needed (arthritis pain - knees, shoulder and thighs).  07/31/18   [name], MD  diltiazem (CARDIZEM CD) 120 MG 24 hr capsule Take 1 capsule (120 mg total) by mouth daily. Patient taking differently: Take 120 mg by mouth at bedtime.  06/07/19  Chanetta Marshall K, NP  famotidine (PEPCID) 20 MG tablet Take 1 tablet (20 mg total) by mouth daily. 06/05/19   Anderson, Chelsey L, DO  flecainide (TAMBOCOR) 100 MG tablet TAKE 1 TABLET BY MOUTH 2 TIMES A DAY. PLEASE KEEP UPCOMING APPOINTMENT IN JANUARY BEFORE ANYMORE REFILLS. Patient taking differently: Take 100 mg by mouth 2 (two) times daily.  12/03/18   Seiler, Amber K, NP  fluticasone (FLONASE) 50 MCG/ACT nasal spray Place 2 sprays into both nostrils daily.  10/29/18   [provider]  furosemide (LASIX) 80 MG tablet Take 1 tablet (80 mg total) by mouth daily. 06/16/19 07/16/19  Kathrene Alu, MD  guaiFENesin-dextromethorphan (ROBITUSSIN DM) 100-10 MG/5ML syrup Take 5 mLs by mouth every 4 (four) hours as needed for cough. 06/01/19   Lattie Haw, MD  hydrOXYzine (VISTARIL) 25 MG capsule Take 1 capsule (25 mg total) by mouth 2 (two) times daily as needed for anxiety. 06/02/19   Rory Percy, DO  ipratropium-albuterol (DUONEB) 0.5-2.5 (3) MG/3ML SOLN Take 3 mLs by nebulization every 12 (twelve) hours as needed (shortness of breath, wheezing). 07/07/19   Lyndee Hensen, MD  loratadine (CLARITIN) 10 MG  tablet TAKE 1 TABLET(10 MG) BY MOUTH DAILY Patient taking differently: Take 10 mg by mouth daily.  03/05/19   [name], MD  Melatonin 3 MG TABS Take 1 tablet (3 mg total) by mouth at bedtime as needed (sleep). 07/07/19   Lyndee Hensen, MD  metoprolol tartrate (LOPRESSOR) 25 MG tablet Take 0.5 tablets (12.5 mg total) by mouth 2 (two) times daily. 06/02/19   Rory Percy, DO  mometasone-formoterol (DULERA) 100-5 MCG/ACT AERO Inhale 2 puffs into the lungs 2 (two) times daily. 06/05/19   Anderson, Chelsey L, DO  montelukast (SINGULAIR) 10 MG tablet Take 1 tablet (10 mg total) by mouth at bedtime. 06/01/19   Lattie Haw, MD  omeprazole (PRILOSEC) 20 MG capsule Take 1 capsule (20 mg total) by mouth daily. 06/01/19   Lattie Haw, MD  OXYGEN Inhale 3 L into the lungs continuous.     [provider]  potassium chloride SA (KLOR-CON) 20 MEQ tablet Take 2 tablets (40 mEq total) by mouth 2 (two) times daily. 06/16/19 07/16/19  Kathrene Alu, MD  topiramate (TOPAMAX) 25 MG tablet TAKE 1 TABLET(25 MG) BY MOUTH DAILY 07/08/19   [name], MD  traMADol (ULTRAM) 50 MG tablet Take 1 tablet (50 mg total) by mouth every 12 (twelve) hours as needed (breakthrough pain. Must have tried Tylenol first.). 07/07/19   Lyndee Hensen, MD  umeclidinium bromide (INCRUSE ELLIPTA) 62.5 MCG/INH AEPB Inhale 1 puff into the lungs daily. 07/08/19   Lyndee Hensen, MD  venlafaxine (EFFEXOR) 37.5 MG tablet Take 37.5 mg by mouth daily with breakfast. Take with a 150 mg ER capsule every morning    [provider]  venlafaxine XR (EFFEXOR-XR) 150 MG 24 hr capsule Take 1 capsule (150 mg total) by mouth daily with breakfast. Take 150mg  capsule with 37.5mg  tablet Patient taking differently: Take 150 mg by mouth daily with breakfast. Take with a 37.5 mg tablet every morning 06/01/19   Lattie Haw, MD  VENTOLIN HFA 108 (90 Base) MCG/ACT inhaler INHALE 2 PUFFS INTO THE LUNGS EVERY 6 HOURS AS NEEDED FOR WHEEZING  OR SHORTNESS OF BREATH Patient taking differently: Inhale 2 puffs into the lungs every 6 (six) hours as needed for wheezing or shortness of breath.  06/07/19   [name], MD    Allergies    Caffeine, Hydralazine hcl,  Hydrocodone, Ciprofloxacin, Erythromycin, Lisinopril, Oxycodone, and Sulfamethoxazole-trimethoprim  Review of Systems   Review of Systems  Respiratory: Positive for cough and shortness of breath.   All other systems reviewed and are negative.   Physical Exam Updated Vital Signs BP (!) 152/66   Pulse (!) 109   Temp 98.9 F (37.2 C) (Oral)   Resp (!) 28   Ht 5\' 5"  (1.651 m)   Wt (!) 200 kg   SpO2 96%   BMI 73.37 kg/m   Physical Exam Vitals and nursing note reviewed.  Constitutional:      Appearance: She is well-developed.  HENT:     Head: Normocephalic and atraumatic.  Eyes:     Extraocular Movements: Extraocular movements intact.     Pupils: Pupils are equal, round, and reactive to light.  Cardiovascular:     Rate and Rhythm: Normal rate and regular rhythm.  Pulmonary:     Effort: Tachypnea present.     Breath sounds: Wheezing present.  Abdominal:     General: Bowel sounds are normal.     Palpations: Abdomen is soft.  Musculoskeletal:        General: Normal range of motion.     Cervical back: Normal range of motion and neck supple.  Skin:    General: Skin is warm.     Capillary Refill: Capillary refill takes less than 2 seconds.  Neurological:     General: No focal deficit present.     Mental Status: She is alert and oriented to person, place, and time.  Psychiatric:        Mood and Affect: Mood normal.        Behavior: Behavior normal.     ED Results / Procedures / Treatments   Labs (all labs ordered are listed, but only abnormal results are displayed) Labs Reviewed  CBC WITH DIFFERENTIAL/PLATELET - Abnormal; Notable for the following components:      Result Value   WBC 11.0 (*)    RBC 3.79 (*)    Hemoglobin 9.4 (*)    HCT 33.1 (*)     MCH 24.8 (*)    MCHC 28.4 (*)    RDW 19.1 (*)    Platelets 564 (*)    All other components within normal limits  COMPREHENSIVE METABOLIC PANEL - Abnormal; Notable for the following components:   Potassium 2.4 (*)    Glucose, Bld 107 (*)    Calcium 8.7 (*)    Albumin 3.0 (*)    All other components within normal limits  D-DIMER, QUANTITATIVE (NOT AT [facility]) - Abnormal; Notable for the following components:   D-Dimer, Quant 1.28 (*)    All other components within normal limits  LACTATE DEHYDROGENASE - Abnormal; Notable for the following components:   LDH 195 (*)    All other components within normal limits  FIBRINOGEN - Abnormal; Notable for the following components:   Fibrinogen 581 (*)    All other components within normal limits  C-REACTIVE PROTEIN - Abnormal; Notable for the following components:   CRP 1.7 (*)    All other components within normal limits  FERRITIN - Abnormal; Notable for the following components:   Ferritin 9 (*)    All other components within normal limits  RESPIRATORY PANEL BY RT PCR (FLU A&B, COVID)  CULTURE, BLOOD (ROUTINE X 2)  CULTURE, BLOOD (ROUTINE X 2)  LACTIC ACID, PLASMA  PROCALCITONIN  TRIGLYCERIDES  MAGNESIUM  POC SARS CORONAVIRUS 2 AG -  ED    EKG EKG  Interpretation  Date/Time:  Sunday July 21 2019 11:53:53 EST Ventricular Rate:  94 PR Interval:    QRS Duration: 117 QT Interval:  282 QTC Calculation: 355 R Axis:   119 Text Interpretation: Sinus or ectopic atrial rhythm Incomplete right bundle branch block Low voltage, precordial leads Repol abnrm suggests ischemia, lateral leads Artifact in lead(s) I and baseline wander in lead(s) II III aVR aVL aVF Poor data quality Confirmed by Isla Pence [phone]) on 07/21/2019 12:28:53 PM   Radiology DG Chest Port 1 View  Result Date: 07/21/2019 CLINICAL DATA:  Respiratory distress EXAM: PORTABLE CHEST 1 VIEW COMPARISON:  06/21/2019 FINDINGS: Bilateral mild interstitial thickening. Mild  bibasilar airspace disease. No pleural effusion or pneumothorax. Stable cardiomediastinal silhouette. No aggressive osseous lesion. IMPRESSION: Bibasilar airspace disease which may reflect atelectasis versus pneumonia. Electronically Signed   By: Kathreen Devoid   On: 07/21/2019 12:41    Procedures Procedures (including critical care time)  Medications Ordered in ED Medications  0.9 %  sodium chloride infusion (has no administration in time range)  potassium chloride 10 mEq in 100 mL IVPB (10 mEq Intravenous New Bag/Given 07/21/19 1431)  albuterol (VENTOLIN HFA) 108 (90 Base) MCG/ACT inhaler 2 puff (2 puffs Inhalation Given 07/21/19 1431)  LORazepam (ATIVAN) injection 0.5 mg (has no administration in time range)  albuterol (VENTOLIN HFA) 108 (90 Base) MCG/ACT inhaler 4 puff (4 puffs Inhalation Given 07/21/19 1206)  AeroChamber Plus Flo-Vu Medium MISC 1 each ( Other Given 07/21/19 1208)  ipratropium-albuterol (DUONEB) 0.5-2.5 (3) MG/3ML nebulizer solution 3 mL (3 mLs Nebulization Given 07/21/19 1304)    ED Course  I have reviewed the triage vital signs and the nursing notes.  Pertinent labs & imaging results that were available during my care of the patient were reviewed by me and considered in my medical decision making (see chart for details).    MDM Rules/Calculators/A&P                       Pt given all the right meds by EMS.  She has improved somewhat.  We put her on a [place] high flow O2 at 10L  Which has helped for a short while.  She is placed on cpap which has helped.   POC Covid negative.  PCR is negative.  Pt d/w FP residents who will admit.  [name] White was evaluated in Emergency Department on 07/21/2019 for the symptoms described in the history of present illness. She was evaluated in the context of the global COVID-19 pandemic, which necessitated consideration that the patient might be at risk for infection with the SARS-CoV-2 virus that causes COVID-19. Institutional  protocols and algorithms that pertain to the evaluation of patients at risk for COVID-19 are in a state of rapid change based on information released by regulatory bodies including the CDC and federal and state organizations. These policies and algorithms were followed during the patient's care in the ED.  CRITICAL CARE Performed by: Isla Pence   Total critical care time: 30 minutes  Critical care time was exclusive of separately billable procedures and treating other patients.  Critical care was necessary to treat or prevent imminent or life-threatening deterioration.  Critical care was time spent personally by me on the following activities: development of treatment plan with patient and/or surrogate as well as nursing, discussions with consultants, evaluation of patient's response to treatment, examination of patient, obtaining history from patient or surrogate, ordering and performing treatments and interventions, ordering and review  of laboratory studies, ordering and review of radiographic studies, pulse oximetry and re-evaluation of patient's condition. vo  Final Clinical Impression(s) / ED Diagnoses Final diagnoses:  COPD exacerbation ([place])  Hypokalemia  Acute respiratory failure with hypoxia [facility])    Rx / DC Orders ED Discharge Orders    None       Isla Pence, MD 07/21/19 1443    Isla Pence, MD 07/21/19 [phone]

## 2019-07-21 NOTE — ED Notes (Signed)
ED Provider at bedside. Patient denies pain and confirms with provider that she does not want to be on ventilator. Reports that the Bi-pap is making her feel better.

## 2019-07-22 ENCOUNTER — Observation Stay (HOSPITAL_COMMUNITY): Payer: Medicare HMO

## 2019-07-22 DIAGNOSIS — J9601 Acute respiratory failure with hypoxia: Secondary | ICD-10-CM | POA: Diagnosis present

## 2019-07-22 DIAGNOSIS — E876 Hypokalemia: Secondary | ICD-10-CM | POA: Diagnosis present

## 2019-07-22 DIAGNOSIS — L02416 Cutaneous abscess of left lower limb: Secondary | ICD-10-CM | POA: Diagnosis present

## 2019-07-22 DIAGNOSIS — Z90710 Acquired absence of both cervix and uterus: Secondary | ICD-10-CM | POA: Diagnosis not present

## 2019-07-22 DIAGNOSIS — J189 Pneumonia, unspecified organism: Secondary | ICD-10-CM | POA: Diagnosis present

## 2019-07-22 DIAGNOSIS — I480 Paroxysmal atrial fibrillation: Secondary | ICD-10-CM | POA: Diagnosis present

## 2019-07-22 DIAGNOSIS — Z66 Do not resuscitate: Secondary | ICD-10-CM | POA: Diagnosis present

## 2019-07-22 DIAGNOSIS — Z86711 Personal history of pulmonary embolism: Secondary | ICD-10-CM | POA: Diagnosis not present

## 2019-07-22 DIAGNOSIS — G2581 Restless legs syndrome: Secondary | ICD-10-CM | POA: Diagnosis present

## 2019-07-22 DIAGNOSIS — Z7951 Long term (current) use of inhaled steroids: Secondary | ICD-10-CM | POA: Diagnosis not present

## 2019-07-22 DIAGNOSIS — J383 Other diseases of vocal cords: Secondary | ICD-10-CM | POA: Diagnosis present

## 2019-07-22 DIAGNOSIS — Z6841 Body Mass Index (BMI) 40.0 and over, adult: Secondary | ICD-10-CM | POA: Diagnosis not present

## 2019-07-22 DIAGNOSIS — E662 Morbid (severe) obesity with alveolar hypoventilation: Secondary | ICD-10-CM | POA: Diagnosis present

## 2019-07-22 DIAGNOSIS — Z20822 Contact with and (suspected) exposure to covid-19: Secondary | ICD-10-CM | POA: Diagnosis present

## 2019-07-22 DIAGNOSIS — F419 Anxiety disorder, unspecified: Secondary | ICD-10-CM | POA: Diagnosis present

## 2019-07-22 DIAGNOSIS — I5032 Chronic diastolic (congestive) heart failure: Secondary | ICD-10-CM | POA: Diagnosis present

## 2019-07-22 DIAGNOSIS — L0231 Cutaneous abscess of buttock: Secondary | ICD-10-CM | POA: Diagnosis present

## 2019-07-22 DIAGNOSIS — Z79899 Other long term (current) drug therapy: Secondary | ICD-10-CM | POA: Diagnosis not present

## 2019-07-22 DIAGNOSIS — Z9981 Dependence on supplemental oxygen: Secondary | ICD-10-CM | POA: Diagnosis not present

## 2019-07-22 DIAGNOSIS — K219 Gastro-esophageal reflux disease without esophagitis: Secondary | ICD-10-CM | POA: Diagnosis present

## 2019-07-22 DIAGNOSIS — J441 Chronic obstructive pulmonary disease with (acute) exacerbation: Secondary | ICD-10-CM | POA: Diagnosis present

## 2019-07-22 DIAGNOSIS — F329 Major depressive disorder, single episode, unspecified: Secondary | ICD-10-CM | POA: Diagnosis present

## 2019-07-22 DIAGNOSIS — Z7901 Long term (current) use of anticoagulants: Secondary | ICD-10-CM | POA: Diagnosis not present

## 2019-07-22 DIAGNOSIS — Z87891 Personal history of nicotine dependence: Secondary | ICD-10-CM | POA: Diagnosis not present

## 2019-07-22 LAB — GLUCOSE, CAPILLARY
Glucose-Capillary: 138 mg/dL — ABNORMAL HIGH (ref 70–99)
Glucose-Capillary: 121 mg/dL — ABNORMAL HIGH (ref 70–99)
Glucose-Capillary: 110 mg/dL — ABNORMAL HIGH (ref 70–99)
Glucose-Capillary: 133 mg/dL — ABNORMAL HIGH (ref 70–99)
Glucose-Capillary: 146 mg/dL — ABNORMAL HIGH (ref 70–99)

## 2019-07-22 LAB — BASIC METABOLIC PANEL
Anion gap: 9 (ref 5–15)
Anion gap: 11 (ref 5–15)
BUN: 7 mg/dL — ABNORMAL LOW (ref 8–23)
BUN: 6 mg/dL — ABNORMAL LOW (ref 8–23)
CO2: 34 mmol/L — ABNORMAL HIGH (ref 22–32)
CO2: 34 mmol/L — ABNORMAL HIGH (ref 22–32)
Calcium: 9 mg/dL (ref 8.9–10.3)
Calcium: 8.6 mg/dL — ABNORMAL LOW (ref 8.9–10.3)
Chloride: 100 mmol/L (ref 98–111)
Chloride: 98 mmol/L (ref 98–111)
Creatinine, Ser: 0.51 mg/dL (ref 0.44–1.00)
Creatinine, Ser: 0.55 mg/dL (ref 0.44–1.00)
GFR calc Af Amer: >60 mL/min (ref >60)
GFR calc Af Amer: >60 mL/min (ref >60)
GFR calc non Af Amer: >60 mL/min (ref >60)
GFR calc non Af Amer: >60 mL/min (ref >60)
Glucose, Bld: 181 mg/dL — ABNORMAL HIGH (ref 70–99)
Glucose, Bld: 131 mg/dL — ABNORMAL HIGH (ref 70–99)
Potassium: 3.3 mmol/L — ABNORMAL LOW (ref 3.5–5.1)
Potassium: 3.3 mmol/L — ABNORMAL LOW (ref 3.5–5.1)
Sodium: 143 mmol/L (ref 135–145)
Sodium: 143 mmol/L (ref 135–145)

## 2019-07-22 LAB — CBC
HCT: 32 % — ABNORMAL LOW (ref 36.0–46.0)
Hemoglobin: 9.4 g/dL — ABNORMAL LOW (ref 12.0–15.0)
MCH: 25.2 pg — ABNORMAL LOW (ref 26.0–34.0)
MCHC: 29.4 g/dL — ABNORMAL LOW (ref 30.0–36.0)
MCV: 85.8 fL (ref 80.0–100.0)
Platelets: 538 10*3/uL — ABNORMAL HIGH (ref 150–400)
RBC: 3.73 MIL/uL — ABNORMAL LOW (ref 3.87–5.11)
RDW: 18.9 % — ABNORMAL HIGH (ref 11.5–15.5)
WBC: 9.5 10*3/uL (ref 4.0–10.5)
nRBC: 0 % (ref 0.0–0.2)

## 2019-07-22 MED ORDER — FLECAINIDE ACETATE 50 MG PO TABS
100.0000 mg | ORAL_TABLET | Freq: Two times a day (BID) | ORAL | Status: DC
  Administered 2019-07-22 – 2019-07-25 (×7): 100 mg via ORAL
  Filled 2019-07-22 (×7): qty 2

## 2019-07-22 MED ORDER — SODIUM CHLORIDE 0.9 % IV SOLN
1.0000 g | INTRAVENOUS | Status: DC
  Administered 2019-07-22 – 2019-07-23 (×2): 1 g via INTRAVENOUS
  Filled 2019-07-22 (×2): qty 10

## 2019-07-22 MED ORDER — APIXABAN 5 MG PO TABS
5.0000 mg | ORAL_TABLET | Freq: Two times a day (BID) | ORAL | Status: DC
  Administered 2019-07-22 – 2019-07-25 (×7): 5 mg via ORAL
  Filled 2019-07-22 (×7): qty 1

## 2019-07-22 MED ORDER — POTASSIUM CHLORIDE CRYS ER 20 MEQ PO TBCR
40.0000 meq | EXTENDED_RELEASE_TABLET | Freq: Two times a day (BID) | ORAL | Status: DC
  Administered 2019-07-22 – 2019-07-25 (×7): 40 meq via ORAL
  Filled 2019-07-22 (×7): qty 2

## 2019-07-22 MED ORDER — DOXYCYCLINE HYCLATE 100 MG PO TABS
100.0000 mg | ORAL_TABLET | Freq: Two times a day (BID) | ORAL | Status: DC
  Administered 2019-07-22 – 2019-07-25 (×7): 100 mg via ORAL
  Filled 2019-07-22 (×7): qty 1

## 2019-07-22 MED ORDER — POTASSIUM CHLORIDE 10 MEQ/100ML IV SOLN
INTRAVENOUS | Status: AC
  Filled 2019-07-22: qty 100

## 2019-07-22 MED ORDER — DILTIAZEM HCL ER COATED BEADS 120 MG PO CP24
120.0000 mg | ORAL_CAPSULE | Freq: Every day | ORAL | Status: DC
  Administered 2019-07-22 – 2019-07-24 (×3): 120 mg via ORAL
  Filled 2019-07-22 (×3): qty 1

## 2019-07-22 MED ORDER — METOPROLOL TARTRATE 12.5 MG HALF TABLET
12.5000 mg | ORAL_TABLET | Freq: Two times a day (BID) | ORAL | Status: DC
  Administered 2019-07-22 – 2019-07-25 (×7): 12.5 mg via ORAL
  Filled 2019-07-22 (×7): qty 1

## 2019-07-22 MED ORDER — CYCLOBENZAPRINE HCL 10 MG PO TABS
5.0000 mg | ORAL_TABLET | Freq: Every day | ORAL | Status: AC
  Administered 2019-07-22: 5 mg via ORAL
  Filled 2019-07-22: qty 1

## 2019-07-22 MED ORDER — POTASSIUM CHLORIDE 10 MEQ/100ML IV SOLN: 10.0000 meq | INTRAVENOUS | Status: DC

## 2019-07-22 MED ORDER — ENOXAPARIN SODIUM 300 MG/3ML IJ SOLN
1.0000 mg/kg | Freq: Two times a day (BID) | INTRAMUSCULAR | Status: DC
  Filled 2019-07-22 (×2): qty 1.65

## 2019-07-22 NOTE — TOC Initial Note (Signed)
Transition of Care [facility]) - Initial/Assessment Note    Patient Details  Name: [name] White MRN: [id number] Date of Birth: [birth year]  [facility] CM/SW Contact:    Bethena Roys, RN Phone Number: 07/22/2019, 4:11 PM  Clinical Narrative:  Case Manager received consult regarding durable medical equipment for trilogy vent. Patient has a trilogy in the home from [facility]. Patient is in need of Oxygen concentrator for the home. Case Manager called the physician for an updated order. Patient discussed concerns that she needed a hospital bed, wheelchair and nebulizer-[employer] is working with the patient regarding getting the durable medical equipment delivered to the home. Patient states she has a bedside commode in the home. Prior to arrival patient was from home with her niece. Patient was active with [facility] for Registered Nurse, Physical Therapy, Aide and Occupational Therapy- pt will need resumption orders once stable to transition home and F2F. Patient is active with [facility] and they will follow in the community. Patient's hospital follow up should be scheduled via the [facility] prior to transition home. Case Manager will continue to follow for additional transiton of care needs.                  Expected Discharge Plan: [place] Barriers to Discharge: Continued Medical Work up   Patient Goals and CMS Choice Patient states their goals for this hospitalization and ongoing recovery are:: "to return home"   Choice offered to / list presented to : NA  Expected Discharge Plan and Services Expected Discharge Plan: [place] In-house Referral: NA Discharge Planning Services: CM Consult Post Acute Care Choice: Resumption of Svcs/PTA Provider, Home Health(Active with [facility]) Living arrangements for the past 2 months: Apartment                 DME Arranged: Oxygen DME Agency: [facility] Date DME Agency Contacted:  07/22/19 Time DME Agency Contacted: [phone] Representative spoke with at DME Agency: Thedore Mins HH Arranged: RN, Disease Management, PT, OT, Nurse's Aide [place] Agency: [place] ([place]) Date [place]: 07/22/19 Time HH Agency Contacted: 1300 Representative spoke with at [place]: Mateo Flow  Prior Living Arrangements/Services Living arrangements for the past 2 months: [place] with:: Relatives(Patient lives with the niece.) Patient language and need for interpreter reviewed:: Yes Do you feel safe going back to the place where you live?: Yes      Need for Family Participation in Patient Care: Yes (Comment) Care giver support system in place?: Yes (comment) Current home services: DME, Home OT, Home PT, Home RN, Homehealth aide(Pt has trilogy vent at home- needs concentrator, hospital bed. wheelchair and nebulizer to be delivered to the home via [employer].) Criminal Activity/Legal Involvement Pertinent to Current Situation/Hospitalization: No - Comment as needed  Activities of Daily Living      Permission Sought/Granted Permission sought to share information with : Family Supports, Investment banker, corporate granted to share info w AGENCY: [facility] and [facility]        Emotional Assessment Appearance:: Appears stated age Attitude/Demeanor/Rapport: Engaged, Gracious Affect (typically observed): Appropriate Orientation: : Oriented to Situation, Oriented to  Time, Oriented to Place, Oriented to Self Alcohol / Substance Use: Not Applicable Psych Involvement: No (comment)  Admission diagnosis:  Hypokalemia [E87.6] COPD exacerbation ([place]) [J44.1] Acute respiratory failure with hypoxia ([place]) [J96.01] Acute hypoxemic respiratory failure ([place]) [J96.01] Patient Active Problem List  Diagnosis Date Noted  . Acute hypoxemic respiratory failure ([place]) 07/21/2019  . Hypertension associated with diabetes ([place])   . Fever   . COPD (chronic  obstructive pulmonary disease) ([place]) 06/21/2019  . Community acquired pneumonia 06/14/2019  . Weight gain due to medication 06/05/2019  . Acute respiratory failure with hypoxia ([place]) 05/28/2019  . Bronchitis with acute wheezing 05/09/2019  . Hyperglycemia 03/19/2019  . Acute on chronic respiratory failure with hypoxemia ([place]) 03/14/2019  . Acute on chronic heart failure with preserved ejection fraction ([place]) 03/14/2019  . Acquired thrombophilia ([place]) due to A-Fib 03/07/2019  . Heart failure with preserved ejection fraction ([place]) 03/06/2019  . Obesity hypoventilation syndrome ([place]) 03/06/2019  . Lower GI bleed   . Urge incontinence 01/27/2019  . COPD exacerbation ([place]) 08/29/2018  . COPD with acute exacerbation ([place]) 08/07/2018  . OSA (obstructive sleep apnea) 07/05/2018  . Respiratory failure ([place]) 07/05/2018  . Severe episode of recurrent major depressive disorder, without psychotic features ([place])   . MDD (major depressive disorder), recurrent episode, severe ([place]) 09/07/2015  . Atrial flutter ([place]) 07/29/2015  . Asthma 11/12/2014  . History of hypertension 11/05/2014  . Paroxysmal atrial fibrillation ([place]) 11/03/2014  . Chronic anticoagulation 11/03/2014  . Major depressive disorder 11/03/2014  . History of pulmonary embolus (PE) 11/03/2014  . MDD (major depressive disorder), recurrent severe, without psychosis ([place]) 03/05/2014  . Vocal cord dysfunction 11/12/2013  . Nonintractable headache 01/06/2012  . Hot flashes 04/22/2011  . OVERACTIVE BLADDER 04/18/2008  . Osteoarthritis of both knees 04/18/2008  . Osteoarthritis involving multiple joints on both sides of body 09/14/2007  . Morbid obesity ([place]) 08/24/2006  . RESTLESS LEGS SYNDROME 08/24/2006  . HYPERTENSION, BENIGN SYSTEMIC 08/24/2006  . RHINITIS, ALLERGIC 08/24/2006  . GASTROESOPHAGEAL REFLUX, NO ESOPHAGITIS 08/24/2006   PCP:  [name], MD Pharmacy:   [facility], [address] [place] [address] Phone: [phone] Fax: [phone]     Social Determinants of Health (SDOH) Interventions    Readmission Risk Interventions Readmission Risk Prevention Plan 07/22/2019 03/08/2019  Transportation Screening Complete Complete  Medication Review Press photographer) Complete Complete  PCP or Specialist appointment within 3-5 days of discharge Complete Complete  HRI or [place] Complete Complete  SW Recovery Care/Counseling Consult Complete Complete  Palliative Care Screening Not Applicable Not [place] Not Applicable Not Applicable  Some recent data might be hidden

## 2019-07-22 NOTE — Telephone Encounter (Signed)
Patient currently in the hospital, pt can call back after discharge to get updated verbal orders  [name] MD PGY-3 [facility] Medicine Resident

## 2019-07-22 NOTE — Plan of Care (Signed)
  Problem: Clinical Measurements: Goal: Ability to maintain clinical measurements within normal limits will improve Outcome: Progressing Goal: Diagnostic test results will improve Outcome: Progressing Goal: Respiratory complications will improve Outcome: Progressing   

## 2019-07-22 NOTE — Progress Notes (Signed)
Pt requesting Flexeril for LE cramps. Pt states she has taken this at home but prescription has expired. Provider on call notified. Jessie Foot, RN

## 2019-07-22 NOTE — Progress Notes (Signed)
Patient refused treatments asked to come back later in the day

## 2019-07-22 NOTE — Significant Event (Signed)
Rapid Response Event Note  Overview: MEWS 4  Not acute change per nurse, nurse did not need RR interventions or assistance. Provider was already notified   Dionna Wiedemann R

## 2019-07-22 NOTE — Progress Notes (Signed)
Went to evaluate patient after seeing patient's vitals with some tachycardia and tachypnea.  Patient in no apparent distress with heart rates in the low 110s and satting 97% on 6 L HFNC.  Patient states she overall feels better than yesterday.  Patient states that her heart rates often get fast when she misses her medication, which we did not provide patient her 24-hour diltiazem last night as she was n.p.o. on BiPAP at that time.  Informed patient that at this time would provide IV metoprolol as needed for heart rate control and redose her diltiazem as normal later today.  General: Alert and oriented in no apparent distress Heart: Tachycardic rate but regular appearing rhythm with no murmurs appreciated Lungs: CTA bilaterally, no wheezing  Plan: -We will continue to follow patient's vitals and can redose additional metoprolol IV if needed in the next 30 to 60 minutes. -Plan to reinitiate patient's 24-hour diltiazem tonight prior to her going on BiPAP -We will closely monitor patient's vitals.

## 2019-07-22 NOTE — Progress Notes (Signed)
1/3 aerobic bottle GPC in clusters  [facility] - BLOOD CULTURE   [name] White is an 64 y.o. female who presented to [facility] on 07/21/2019 with a chief complaint of SOB  Assessment:  19 YOF being worked up for acute hypoxic respiratory failure - not currently on antibiotics. The patient is afebrile with a WBC WNL. Now with 1 of 3 BCx growing GPC in clusters - which appears to likely be contamination.   Name of physician (or Provider) Contacted: FMTS  Current antibiotics: None  Changes to prescribed antibiotics recommended:  None needed - f/u on final culture results  No results found for this or any previous visit.  Thank you for allowing pharmacy to be a part of this patient's care.  Alycia Rossetti, PharmD, BCPS Clinical Pharmacist Clinical phone for 07/22/2019: [phone] 07/22/2019 11:22 AM   **Pharmacist phone directory can now be found on [email or web address] (PW [email or web address]).  Listed under [place].

## 2019-07-22 NOTE — Progress Notes (Addendum)
Family Medicine Teaching Service Daily Progress Note Intern Pager: [phone]  Patient name: [name] White Medical record number: [id number] Date of birth: March 12, 1956 Age: 64 y.o. Gender: female  Primary Care Provider: [name], MD Consultants: None Code Status: DNR  Pt Overview and Major Events to Date:  1/24-admitted  Assessment and Plan: [name] White is a 64 y.o. female presenting with shortness of breath. PMH is significant for vocal cord dysfunction status post partial thyroidectomy, COPD/asthma, hypertension, history of PE, A. fib on Eliquis, anxiety, depression, chronic back pain, GERD, falls.  Acute hypoxic respiratory failure Patient did well overnight on BiPAP.  CTA showed no evidence of pulmonary embolus but with bilateral lower lobe airspace opacities and dependent opacities in the upper lobes concerning for pneumonia.  CTA states aspiration pneumonia cannot be excluded.  Patient continues to have no fevers and no elevated white blood cell count.  Also reassured that procalcitonin less than 0.10.  Can have a low threshold to restart antibiotics but do believe her acute hypoxic respiratory failure is most likely caused by equipment malfunction as she is much improved after using BiPAP overnight. -Continue BiPAP as needed -Maintain oxygen saturations greater than 90% -Follow blood cultures -PT/OT -Continuous pulse ox -Continuous cardiac monitoring -Strict I's and O's -Daily weights -Monitor fluid status -Lower extremity Dopplers bilaterally  Hypokalemia Current potassium 3.3.  Status post 6 rounds of IV potassium.  Patient currently on BiPAP so n.p.o., discussed with patient and will order oral potassium for later this morning as she trials coming off of BiPAP.  If unable to tolerate we will provide IV potassium. -Daily BMP -Replace potassium as needed  HFpEF 2 view chest x-ray suggested may be some component of pulmonary edema present.  BNP of 27.5.  Most  recent ECHO 12/02, LVEF 60-65%, normal LV function, severe increased LV hypertrophy.  Home medication Lasix 80 mg daily. Chest xray w/o pulmonary edema. BNP 88.3  On exam patient appears slightly volume overloaded with 2+ pitting edema, hard to determine, but may be dependant due to limited mobility. Overall, does not appear to be in heart failure.  -Continue home medication -Daily weights -Strict I's and O's  Chest pressure Troponin negative.  CTA negative for evidence of pulmonary embolus.  Patient with complaint of chest pressure during both this and recent admission.  During last admission had cardiac work-up and was thought the chest pressure was related to anxiety and respiratory distress as well as GERD and some capacity.   -Continue to monitor  OSA/OHS Patient with trilogy at home. -BiPAP if Covid negative -consider sleep study outpatient  Vocal Cord Dysfunction S/p partial thyroidectomy  Paroxysmal AFIB Home medication Digoxin 120 mg qhs, Metoprolol 12.5 mg BID, Flecanide 100 mg BID.  Per prior notes patient may have a propensity to occasionally flip into A. fib which could increase her chest pressure/anxiety sensations. -Continue home medication  Diabetes Patient with recent diagnosis of diabetes during last admission in December.  HbA1C at that time of 7.8.  No home medications.  Lipid panel shows total cholesterol 197, LDL 106, HDL 82. -start sss insulin -monitor CBG -We will start statin  History of pulmonary embolism Home medication Eliquis.  Geneva score 8, moderate risk of PE.   -Hold home Eliquis in the setting of n.p.o. during BiPAP, will switch to Lovenox (treatment dosing) -We will check lower limb Dopplers.  HTN Current BP 115/62. Home medication, Metopriolol, Cardizem, Lasix -Continue home medication  Morbid Obesity Current weight of 164kg, BMI 60.31.  Marland Kitchen -  encourage healthy lifestyle and weight management  ?COPD/Asthma vs. Vocal Cord  dysfunction Recent PFT's show no evidence of COPD/Asthma. Possibly vocal cord dis on home oxygen 6L.  Followed by Dr.Byrum, Pulmonology.  Missed last appointment.  Home medications Montelukast, Albuterol, Duo-nebs, Dulera. Was followed by Pulmonology, but medications were not discontinued. Recommend avoiding systemic corticosteroids as this may worsen vocal cord dysfunction. -Outpatient Pulmonology vs. ENT/SLP follow up -continue home medications -monitor resp status   MDD/Anxiety Patient with a history of anxiety Home medications include Atarax 25 mg, Effexor 187.5 mg daily  -Continue home medications  GERD Home meds Prilosec 20 mg daily and Pepcid 20 mg.  -continue home meds   FEN/GI: Heart healthy/carb modified/n.p.o. while on BiPAP PPx: Lovenox while n.p.o. on BiPAP  Disposition: Admitted to cardiac telemetry  Subjective:  Patient states she feels much better today after using BiPAP last night.  She continues to endorse that she feels the main cause of her symptoms were equipment failure due to her oxygen concentrator.  She denies chest pains at this time and is satting well on BiPAP.  Discussed with patient the fact that her potassium level still little bit low and that while she is on BiPAP she cannot have anything by mouth.  Through shared decision making decided with the patient we will hold off on replacing potassium until she wakes up little bit more this morning and tries coming off of BiPAP at which point we can do potassium by mouth as patient dislikes the burning sensation IV potassium gives her.  Objective: Temp:  [98.5 F (36.9 C)-99.1 F (37.3 C)] 99.1 F (37.3 C) (01/25 0434) Pulse Rate:  [88-122] 102 (01/25 0434) Resp:  [17-28] 17 (01/25 0434) BP: (113-159)/(62-104) 115/62 (01/25 0434) SpO2:  [29 %-100 %] 98 % (01/25 0434) FiO2 (%):  [100 %] 100 % (01/24 1500) Weight:  [164.4 kg-200 kg] 164.4 kg (01/25 0434) Physical Exam: General: Alert and oriented in no  apparent distress Heart: S1, S2 with no murmurs appreciated Lungs: CTA, BiPAP present with no respiratory distress Abdomen: Bowel sounds present, no abdominal pain Skin: Warm and dry Extremities: Some lower extremity edema without pitting.   Laboratory: Recent Labs  Lab 07/21/19 1148 07/21/19 1726  WBC 11.0*  --   HGB 9.4* 11.6*  HCT 33.1* 34.0*  PLT 564*  --    Recent Labs  Lab 07/21/19 1148 07/21/19 1726 07/22/19 0030  NA 142 142 143  K 2.4* 2.6* 3.3*  CL 100  --  100  CO2 31  --  34*  BUN 9  --  7*  CREATININE 0.53  --  0.51  CALCIUM 8.7*  --  9.0  PROT 7.2  --   --   BILITOT 0.4  --   --   ALKPHOS 73  --   --   ALT 21  --   --   AST 22  --   --   GLUCOSE 107*  --  181*      Imaging/Diagnostic Tests: X-ray chest PA and lateral  Result Date: 07/21/2019 CLINICAL DATA:  Shortness of breath EXAM: CHEST - 2 VIEW COMPARISON:  07/21/2019 FINDINGS: Mild cardiomegaly with mild diffuse interstitial opacity. No pneumothorax or sizable pleural effusion. No focal airspace consolidation IMPRESSION: Mild cardiomegaly and mild diffuse interstitial opacity, which may indicate mild pulmonary edema. Electronically Signed   By: Ulyses Jarred M.D.   On: 07/21/2019 21:31   CT ANGIO CHEST PE W OR WO CONTRAST  Result Date: 07/21/2019  CLINICAL DATA:  Shortness of breath EXAM: CT ANGIOGRAPHY CHEST WITH CONTRAST TECHNIQUE: Multidetector CT imaging of the chest was performed using the standard protocol during bolus administration of intravenous contrast. Multiplanar CT image reconstructions and MIPs were obtained to evaluate the vascular anatomy. CONTRAST:  50mL OMNIPAQUE IOHEXOL 350 MG/ML SOLN COMPARISON:  06/21/2019 FINDINGS: Cardiovascular: No filling defects in the pulmonary arteries to suggest pulmonary emboli. Heart is borderline in size. Aorta normal caliber. Scattered aortic calcifications. Mediastinum/Nodes: No mediastinal, hilar, or axillary adenopathy. Trachea and esophagus are  unremarkable. Thyroid unremarkable. Lungs/Pleura: Airspace disease noted in the lower lobes bilaterally as well as in the posterior upper lobes. Appearance is concerning for pneumonia. Aspiration cannot be excluded. Upper Abdomen: Imaging into the upper abdomen shows no acute findings. Musculoskeletal: Chest wall soft tissues are unremarkable. No acute bony abnormality. Review of the MIP images confirms the above findings. IMPRESSION: No evidence of pulmonary embolus. Bilateral lower lobe airspace opacities and dependent opacities in the upper lobes. Appearance is concerning for pneumonia. Aspiration pneumonia cannot be excluded. Borderline cardiomegaly Aortic Atherosclerosis (ICD10-I70.0). Electronically Signed   By: Rolm Baptise M.D.   On: 07/21/2019 21:29   DG Chest Port 1 View  Result Date: 07/21/2019 CLINICAL DATA:  Respiratory distress EXAM: PORTABLE CHEST 1 VIEW COMPARISON:  06/21/2019 FINDINGS: Bilateral mild interstitial thickening. Mild bibasilar airspace disease. No pleural effusion or pneumothorax. Stable cardiomediastinal silhouette. No aggressive osseous lesion. IMPRESSION: Bibasilar airspace disease which may reflect atelectasis versus pneumonia. Electronically Signed   By: Kathreen Devoid   On: 07/21/2019 12:41     Lurline Del, DO 07/22/2019, 6:09 AM PGY-1, [place] Intern pager: [phone], text pages welcome

## 2019-07-22 NOTE — Telephone Encounter (Signed)
Patient admitted to to [facility] 07/21/19. Unable to schedule follow up appointment at this time.

## 2019-07-23 ENCOUNTER — Other Ambulatory Visit: Payer: Self-pay

## 2019-07-23 ENCOUNTER — Inpatient Hospital Stay (HOSPITAL_COMMUNITY): Payer: Medicare HMO

## 2019-07-23 LAB — GLUCOSE, CAPILLARY
Glucose-Capillary: 105 mg/dL — ABNORMAL HIGH (ref 70–99)
Glucose-Capillary: 136 mg/dL — ABNORMAL HIGH (ref 70–99)
Glucose-Capillary: 89 mg/dL (ref 70–99)
Glucose-Capillary: 86 mg/dL (ref 70–99)

## 2019-07-23 LAB — BASIC METABOLIC PANEL
Anion gap: 8 (ref 5–15)
BUN: 13 mg/dL (ref 8–23)
CO2: 34 mmol/L — ABNORMAL HIGH (ref 22–32)
Calcium: 8.6 mg/dL — ABNORMAL LOW (ref 8.9–10.3)
Chloride: 101 mmol/L (ref 98–111)
Creatinine, Ser: 0.53 mg/dL (ref 0.44–1.00)
GFR calc Af Amer: >60 mL/min (ref >60)
GFR calc non Af Amer: >60 mL/min (ref >60)
Glucose, Bld: 110 mg/dL — ABNORMAL HIGH (ref 70–99)
Potassium: 3.2 mmol/L — ABNORMAL LOW (ref 3.5–5.1)
Sodium: 143 mmol/L (ref 135–145)

## 2019-07-23 LAB — CULTURE, BLOOD (ROUTINE X 2): Special Requests: ADEQUATE

## 2019-07-23 MED ORDER — MOMETASONE FURO-FORMOTEROL FUM 100-5 MCG/ACT IN AERO
2.0000 | INHALATION_SPRAY | Freq: Two times a day (BID) | RESPIRATORY_TRACT | Status: DC
  Administered 2019-07-23 – 2019-07-25 (×5): 2 via RESPIRATORY_TRACT
  Filled 2019-07-23: qty 8.8

## 2019-07-23 MED ORDER — ONDANSETRON 4 MG PO TBDP
4.0000 mg | ORAL_TABLET | Freq: Once | ORAL | Status: AC | PRN
  Administered 2019-07-23: 4 mg via ORAL
  Filled 2019-07-23: qty 1

## 2019-07-23 MED ORDER — UMECLIDINIUM BROMIDE 62.5 MCG/INH IN AEPB
1.0000 | INHALATION_SPRAY | Freq: Every day | RESPIRATORY_TRACT | Status: DC
  Administered 2019-07-23 – 2019-07-25 (×2): 1 via RESPIRATORY_TRACT
  Filled 2019-07-23: qty 7

## 2019-07-23 NOTE — Discharge Summary (Addendum)
[facility] Discharge Summary  Patient name: [name] White Medical record number: [id number] Date of birth: 01-Feb-1956 Age: 64 y.o. Gender: female Date of Admission: 07/21/2019  Date of Discharge: 07/24/2018 Admitting Physician: Leeanne Rio, MD  Primary Care Provider: [name], MD Consultants: None  Indication for Hospitalization: Shortness of breath  Discharge Diagnoses/Problem List:  Active Problems:   Acute hypoxemic respiratory failure [facility])  Disposition: Home  Discharge Condition: Stable  Discharge Exam:  General: Alert and oriented in no apparent distress Heart: Regular rate and rhythm with no murmurs appreciated Lungs: CTA bilaterally, breathing well 5-6 L nasal cannula Abdomen: Bowel sounds present, no abdominal pain  Brief Hospital Course:  Patient admitted after experiencing some shortness of breath which she attributed to equipment failure with her oxygen concentrator at home.  Patient was previously admitted and discharged with trilogy BiPAP.  She states this initially worked well but her concentrator began blowing less and less oxygen and eventually started working only intermittently.  Patient was started on BiPAP and her shortness of breath improved with O2 sats in the mid 90s.  Patient used BiPAP from admission through the first night after which she was transitioned to nasal cannula via day and BiPAP at night.  Patient did well on this and was eventually transitioned to home oxygen dose of 5 L nasal cannula.  Patient was able to get a replacement for oxygen concentrator and was discharged home on 07/24/2018 after confirmation that this had arrived.  Community-acquired pneumonia:  In the emergency department patient was noted to have white blood cell count barely elevated at 11, no fevers, negative pro calcitonin.  She received chest x-ray and a CTA which showed signs of possible pneumonia.  Patient had endorsed a recent  increase in coughing and so was started on doxycycline and ceftriaxone for community-acquired pneumonia.  Patient ceftriaxone was discontinued after 2 days.  Her doxycycline was continued orally for 7 days.  Left thigh abscess On 1/26 patient endorsed some pain in the area of her left thigh.  It was noted that she had a small area of drainage and an ultrasound was ordered which showed a 2 x 2 centimeter abscess.  Surgery was consulted to drain the small abscess and after draining it recommended that she continue with dressing changes and no additional follow-up.  Patient was discharged on doxycycline for community-acquired pneumonia which would cover for skin flora/cellulitis.  Issues for Follow Up:  PCP-recommend follow-up to check breathing status  Recommend PCP check left thigh abscess to ensure healing appropriately  Significant Procedures:  Left thigh I&D 07/23/2018  Significant Labs and Imaging:  Recent Labs  Lab 07/21/19 1148 07/21/19 1148 07/21/19 1726 07/22/19 0616 07/24/19 0333  WBC 11.0*  --   --  9.5 9.7  HGB 9.4*   < > 11.6* 9.4* 8.8*  HCT 33.1*   < > 34.0* 32.0* 31.8*  PLT 564*  --   --  538* 517*   < > = values in this interval not displayed.   Recent Labs  Lab 07/21/19 1148 07/21/19 1148 07/21/19 1208 07/21/19 1726 07/21/19 1726 07/22/19 0030 07/22/19 0030 07/22/19 0616 07/22/19 0616 07/23/19 0618 07/24/19 0333  NA 142   < >  --  142  --  143  --  143  --  143 142  K 2.4*   < >  --  2.6*   < > 3.3*   < > 3.3*   < > 3.2* 3.4*  CL 100  --   --   --   --  100  --  98  --  101 99  CO2 31  --   --   --   --  34*  --  34*  --  34* 36*  GLUCOSE 107*  --   --   --   --  181*  --  131*  --  110* 117*  BUN 9  --   --   --   --  7*  --  6*  --  13 6*  CREATININE 0.53  --   --   --   --  0.51  --  0.55  --  0.53 0.50  CALCIUM 8.7*  --   --   --   --  9.0  --  8.6*  --  8.6* 8.5*  MG  --   --  2.3  --   --   --   --   --   --   --   --   ALKPHOS 73  --   --   --   --    --   --   --   --   --   --   AST 22  --   --   --   --   --   --   --   --   --   --   ALT 21  --   --   --   --   --   --   --   --   --   --   ALBUMIN 3.0*  --   --   --   --   --   --   --   --   --   --    < > = values in this interval not displayed.     Results/Tests Pending at Time of Discharge: None  Discharge Medications:  Allergies as of 07/25/2019       Reactions   Caffeine Other (See Comments)   Migraine   Hydralazine Hcl    Other reaction(s): Other (See Comments) AKI leading to rhabdo and electrolyte abnormalities   Hydrocodone Nausea And Vomiting   Headache   Ciprofloxacin Hives, Rash   Erythromycin Hives, Rash   Lisinopril Cough   Oxycodone Nausea And Vomiting   Headache   Sulfamethoxazole-trimethoprim Rash        Medication List     STOP taking these medications    bacitracin ointment       TAKE these medications    acetaminophen 325 MG tablet Commonly known as: TYLENOL Take 2 tablets (650 mg total) by mouth every 6 (six) hours as needed for mild pain.   albuterol (2.5 MG/3ML) 0.083% nebulizer solution Commonly known as: PROVENTIL Take 3 mLs (2.5 mg total) by nebulization every 6 (six) hours as needed for wheezing or shortness of breath. What changed: Another medication with the same name was changed. Make sure you understand how and when to take each.   Ventolin HFA 108 (90 Base) MCG/ACT inhaler Generic drug: albuterol INHALE 2 PUFFS INTO THE LUNGS EVERY 6 HOURS AS NEEDED FOR WHEEZING OR SHORTNESS OF BREATH What changed: See the new instructions.   apixaban 5 MG Tabs tablet Commonly known as: ELIQUIS Take 1 tablet (5 mg total) by mouth 2 (two) times daily.   atorvastatin 40 MG tablet Commonly known as: LIPITOR Take 1 tablet (40 mg  total) by mouth daily at 6 PM.   azelastine 0.05 % ophthalmic solution Commonly known as: OPTIVAR Place 1 drop into both eyes 2 (two) times daily as needed (itchy eyes).   azelastine 0.1 % nasal  spray Commonly known as: ASTELIN Place 2 sprays into both nostrils 2 (two) times daily. Use in each nostril as directed What changed:  when to take this additional instructions   cycloSPORINE 0.05 % ophthalmic emulsion Commonly known as: RESTASIS Place 1 drop into both eyes 2 (two) times daily.   diclofenac sodium 1 % Gel Commonly known as: VOLTAREN APPLY 2 GRAMS EXTERNALLY TO THE AFFECTED AREA FOUR TIMES DAILY What changed: See the new instructions.   diltiazem 120 MG 24 hr capsule Commonly known as: CARDIZEM CD Take 1 capsule (120 mg total) by mouth daily. What changed: when to take this   doxycycline 100 MG tablet Commonly known as: VIBRA-TABS Take 1 tablet (100 mg total) by mouth 2 (two) times daily for 3 days. Give 2 hours before or 4 hours after iron.   famotidine 20 MG tablet Commonly known as: PEPCID Take 1 tablet (20 mg total) by mouth daily.   flecainide 100 MG tablet Commonly known as: TAMBOCOR TAKE 1 TABLET BY MOUTH 2 TIMES A DAY. PLEASE KEEP UPCOMING APPOINTMENT IN JANUARY BEFORE ANYMORE REFILLS. What changed: See the new instructions.   fluticasone 50 MCG/ACT nasal spray Commonly known as: FLONASE Place 2 sprays into both nostrils daily.   furosemide 80 MG tablet Commonly known as: LASIX Take 1 tablet (80 mg total) by mouth daily.   guaiFENesin-dextromethorphan 100-10 MG/5ML syrup Commonly known as: ROBITUSSIN DM Take 5 mLs by mouth every 4 (four) hours as needed for cough.   hydrOXYzine 25 MG capsule Commonly known as: VISTARIL Take 1 capsule (25 mg total) by mouth 2 (two) times daily as needed for anxiety.   ipratropium-albuterol 0.5-2.5 (3) MG/3ML Soln Commonly known as: DUONEB Take 3 mLs by nebulization every 12 (twelve) hours as needed (shortness of breath, wheezing).   loratadine 10 MG tablet Commonly known as: CLARITIN TAKE 1 TABLET(10 MG) BY MOUTH DAILY What changed: See the new instructions.   Melatonin 3 MG Tabs Take 1 tablet (3 mg  total) by mouth at bedtime as needed (sleep).   metoprolol tartrate 25 MG tablet Commonly known as: LOPRESSOR Take 0.5 tablets (12.5 mg total) by mouth 2 (two) times daily.   mometasone-formoterol 100-5 MCG/ACT Aero Commonly known as: DULERA Inhale 2 puffs into the lungs 2 (two) times daily.   montelukast 10 MG tablet Commonly known as: SINGULAIR Take 1 tablet (10 mg total) by mouth at bedtime.   omeprazole 20 MG capsule Commonly known as: PRILOSEC Take 1 capsule (20 mg total) by mouth daily.   OXYGEN Inhale 3 L into the lungs continuous.   polyethylene glycol 17 g packet Commonly known as: MIRALAX / GLYCOLAX Take 17 g by mouth daily as needed for mild constipation.   potassium chloride SA 20 MEQ tablet Commonly known as: KLOR-CON Take 2 tablets (40 mEq total) by mouth 2 (two) times daily.   topiramate 50 MG tablet Commonly known as: TOPAMAX Take 1 tablet (50 mg total) by mouth daily. What changed:  medication strength See the new instructions.   traMADol 50 MG tablet Commonly known as: ULTRAM Take 1 tablet (50 mg total) by mouth every 12 (twelve) hours as needed (breakthrough pain. Must have tried Tylenol first.).   umeclidinium bromide 62.5 MCG/INH Aepb Commonly known as: INCRUSE ELLIPTA  Inhale 1 puff into the lungs daily.   venlafaxine 37.5 MG tablet Commonly known as: EFFEXOR Take 37.5 mg by mouth daily with breakfast. Take with a 150 mg ER capsule every morning What changed: Another medication with the same name was changed. Make sure you understand how and when to take each.   venlafaxine XR 150 MG 24 hr capsule Commonly known as: EFFEXOR-XR Take 1 capsule (150 mg total) by mouth daily with breakfast. Take 150mg  capsule with 37.5mg  tablet What changed: additional instructions               Durable Medical Equipment  (From admission, onward)           Start     Ordered   07/22/19 1601  For home use only DME oxygen  Once    Question Answer  Comment  Length of Need Lifetime   Mode or (Route) Nasal cannula   Liters per Minute 5   Frequency Continuous (stationary and portable oxygen unit needed)   Oxygen conserving device Yes   Oxygen delivery system Gas      07/22/19 1601            Discharge Instructions: Please refer to Patient Instructions section of EMR for full details.  Patient was counseled important signs and symptoms that should prompt return to medical care, changes in medications, dietary instructions, activity restrictions, and follow up appointments.   Follow-Up Appointments: Follow-up Information     [facility]-Home Follow up.   Specialty: Home Health Services Why: Registered Nurse, AIde, Physical and Occupational THerapies, Aide- office to call you with an appointment time for visit. If you need anything please call [phone].        [facility] Follow up.   Why: Oxygen, Wheelchair, nebulizer. Hospital bed- to be delivered to the home.  Contact information: [place] [address] [phone]         [name], MD Follow up on 07/31/2019.   Specialty: Family Medicine Why: 3:10PM Contact information: [id number] [place] [address] [place], [place], DO 07/25/2019, 4:26 PM PGY-1, [place]

## 2019-07-23 NOTE — Consult Note (Signed)
Patient's primary care provider is in the [facility] and RN Chronic Disease Manager is made aware of admission.  Natividad Brood, RN BSN [facility] Liaison  [phone] business mobile phone Toll free office [phone]  Fax number: [phone] [email or web address]

## 2019-07-23 NOTE — Progress Notes (Signed)
PT Cancellation Note  Patient Details Name: [name] White MRN: [id number] DOB: 1956-05-08   Cancelled Treatment:    Reason Eval/Treat Not Completed: Patient at procedure or test/unavailable Pt getting a bath. Will follow.   Marguarite Arbour A Charlea Nardo 07/23/2019, 3:55 PM  Marisa Severin, PT, DPT Acute Rehabilitation Services Pager [phone] Office [phone]

## 2019-07-23 NOTE — Progress Notes (Signed)
Family Medicine Teaching Service Daily Progress Note Intern Pager: [phone]  Patient name: [name] White Medical record number: [id number] Date of birth: [birth year] Age: 64 y.o. Gender: female  Primary Care Provider: [name], MD Consultants: None Code Status: DNR  Pt Overview and Major Events to Date:  1/24-admitted  Assessment and Plan: [name] White is a 64 y.o. female presenting with shortness of breath. PMH is significant for vocal cord dysfunction status post partial thyroidectomy, COPD/asthma, hypertension, history of PE, A. fib on Eliquis, anxiety, depression, chronic back pain, GERD, falls.  Acute hypoxic respiratory failure Patient started on antibiotics yesterday for concern of CAP.  CTA showed no evidence of pulmonary embolus but with bilateral lower lobe airspace opacities and dependent opacities in the upper lobes concerning for pneumonia.  CTA states aspiration pneumonia cannot be excluded.  Patient continues to have no fevers and no elevated white blood cell count.  Also reassured that procalcitonin less than 0.10.  Can have a low threshold to restart antibiotics but do believe her acute hypoxic respiratory failure is most likely caused by equipment malfunction as she is much improved after using BiPAP overnight. -Continue BiPAP as needed -Maintain oxygen saturations greater than 90% -Follow blood cultures -PT/OT -Continuous pulse ox -Continuous cardiac monitoring -Strict I's and O's -Daily weights -Monitor fluid status -Continue ceftriaxone and doxycycline (day 2)  Hypokalemia Current potassium 3.2.  Will replace with 40 K-Dur x2 -Daily BMP -Replace potassium as needed  HFpEF 2 view chest x-ray suggested may be some component of pulmonary edema present.  BNP of 27.5.  Most recent ECHO 12/02, LVEF 60-65%, normal LV function, severe increased LV hypertrophy.  Home medication Lasix 80 mg daily. Chest xray w/o pulmonary edema. BNP 88.3  On exam patient  appears slightly volume overloaded with 2+ pitting edema, hard to determine, but may be dependant due to limited mobility. Overall, does not appear to be in heart failure.  -Continue home medication -Daily weights -Strict I's and O's  Chest pressure Troponin negative.  CTA negative for evidence of pulmonary embolus.  Patient with complaint of chest pressure during both this and recent admission.  During last admission had cardiac work-up and was thought the chest pressure was related to anxiety and respiratory distress as well as GERD and some capacity.   -Continue to monitor  OSA/OHS Patient with trilogy at home.  Patient believes part of the problem that led her to this hospitalization was a malfunction with her oxygen concentrator. -Continue BiPAP at night -Consult to social work for patient assistance in getting a new oxygen concentrator -consider sleep study outpatient  Vocal Cord Dysfunction S/p partial thyroidectomy  Paroxysmal AFIB Home medication Digoxin 120 mg qhs, Metoprolol 12.5 mg BID, Flecanide 100 mg BID.  Per prior notes patient may have a propensity to occasionally flip into A. fib which could increase her chest pressure/anxiety sensations. -Continue home medication  Diabetes Patient with recent diagnosis of diabetes during last admission in December.  HbA1C at that time of 7.8.  No home medications.  Lipid panel shows total cholesterol 197, LDL 106, HDL 82. -start sss insulin -monitor CBG -We will start statin  History of pulmonary embolism Home medication Eliquis.  [place] score 8, moderate risk of PE.   -Continue home Eliquis  HTN Current BP 127/69. Home medication, Metopriolol, Cardizem, Lasix -Continue home medication  Morbid Obesity Current weight of 164kg, BMI 60.31.  . -encourage healthy lifestyle and weight management  ?COPD/Asthma vs. Vocal Cord dysfunction Recent PFT's show no  evidence of COPD/Asthma. Possibly vocal cord dis on home oxygen  6L.  Followed by Dr.Byrum, Pulmonology.  Missed last appointment.  Home medications Montelukast, Albuterol, Duo-nebs, Dulera. Was followed by Pulmonology, but medications were not discontinued. Recommend avoiding systemic corticosteroids as this may worsen vocal cord dysfunction. -Outpatient Pulmonology vs. ENT/SLP follow up -continue home medications -monitor resp status   MDD/Anxiety Patient with a history of anxiety Home medications include Atarax 25 mg, Effexor 187.5 mg daily  -Continue home medications  GERD Home meds Prilosec 20 mg daily and Pepcid 20 mg.  -continue home meds   FEN/GI: Heart healthy/carb modified/n.p.o. while on BiPAP PPx: Eliquis  Disposition: Admitted to cardiac telemetry  Subjective:  Patient states she feels better than she did yesterday and that her BiPAP at night seems to make a real difference for her.  Objective: Temp:  [97.9 F (36.6 C)-98.6 F (37 C)] 97.9 F (36.6 C) (01/26 0458) Pulse Rate:  [79-115] 79 (01/26 0458) Resp:  [12-32] 20 (01/26 0458) BP: (93-127)/(56-72) 127/69 (01/26 0458) SpO2:  [94 %-100 %] 99 % (01/26 0458) FiO2 (%):  [50 %-92 %] 50 % (01/26 0454) Physical Exam: General: Alert and oriented in no apparent distress Heart: Regular rate and rhythm with no murmurs appreciated Lungs: No respiratory distress, nasal cannula on 6 L, lungs sound mostly clear. Abdomen: Bowel sounds present, no abdominal pain Skin: Warm and dry  Laboratory: Recent Labs  Lab 07/21/19 1148 07/21/19 1726 07/22/19 0616  WBC 11.0*  --  9.5  HGB 9.4* 11.6* 9.4*  HCT 33.1* 34.0* 32.0*  PLT 564*  --  538*   Recent Labs  Lab 07/21/19 1148 07/21/19 1148 07/21/19 1726 07/22/19 0030 07/22/19 0616  NA 142   < > 142 143 143  K 2.4*   < > 2.6* 3.3* 3.3*  CL 100  --   --  100 98  CO2 31  --   --  34* 34*  BUN 9  --   --  7* 6*  CREATININE 0.53  --   --  0.51 0.55  CALCIUM 8.7*  --   --  9.0 8.6*  PROT 7.2  --   --   --   --   BILITOT 0.4  --    --   --   --   ALKPHOS 73  --   --   --   --   ALT 21  --   --   --   --   AST 22  --   --   --   --   GLUCOSE 107*  --   --  181* 131*   < > = values in this interval not displayed.      Imaging/Diagnostic Tests: X-ray chest PA and lateral  Result Date: 07/21/2019 CLINICAL DATA:  Shortness of breath EXAM: CHEST - 2 VIEW COMPARISON:  07/21/2019 FINDINGS: Mild cardiomegaly with mild diffuse interstitial opacity. No pneumothorax or sizable pleural effusion. No focal airspace consolidation IMPRESSION: Mild cardiomegaly and mild diffuse interstitial opacity, which may indicate mild pulmonary edema. Electronically Signed   By: Ulyses Jarred M.D.   On: 07/21/2019 21:31   CT ANGIO CHEST PE W OR WO CONTRAST  Result Date: 07/21/2019 CLINICAL DATA:  Shortness of breath EXAM: CT ANGIOGRAPHY CHEST WITH CONTRAST TECHNIQUE: Multidetector CT imaging of the chest was performed using the standard protocol during bolus administration of intravenous contrast. Multiplanar CT image reconstructions and MIPs were obtained to evaluate the vascular anatomy. CONTRAST:  43mL OMNIPAQUE IOHEXOL 350 MG/ML SOLN COMPARISON:  06/21/2019 FINDINGS: Cardiovascular: No filling defects in the pulmonary arteries to suggest pulmonary emboli. Heart is borderline in size. Aorta normal caliber. Scattered aortic calcifications. Mediastinum/Nodes: No mediastinal, hilar, or axillary adenopathy. Trachea and esophagus are unremarkable. Thyroid unremarkable. Lungs/Pleura: Airspace disease noted in the lower lobes bilaterally as well as in the posterior upper lobes. Appearance is concerning for pneumonia. Aspiration cannot be excluded. Upper Abdomen: Imaging into the upper abdomen shows no acute findings. Musculoskeletal: Chest wall soft tissues are unremarkable. No acute bony abnormality. Review of the MIP images confirms the above findings. IMPRESSION: No evidence of pulmonary embolus. Bilateral lower lobe airspace opacities and dependent  opacities in the upper lobes. Appearance is concerning for pneumonia. Aspiration pneumonia cannot be excluded. Borderline cardiomegaly Aortic Atherosclerosis (ICD10-I70.0). Electronically Signed   By: Rolm Baptise M.D.   On: 07/21/2019 21:29   DG Chest Port 1 View  Result Date: 07/21/2019 CLINICAL DATA:  Respiratory distress EXAM: PORTABLE CHEST 1 VIEW COMPARISON:  06/21/2019 FINDINGS: Bilateral mild interstitial thickening. Mild bibasilar airspace disease. No pleural effusion or pneumothorax. Stable cardiomediastinal silhouette. No aggressive osseous lesion. IMPRESSION: Bibasilar airspace disease which may reflect atelectasis versus pneumonia. Electronically Signed   By: Kathreen Devoid   On: 07/21/2019 12:41     Lurline Del, DO 07/23/2019, 6:52 AM PGY-1, [place] Intern pager: [phone], text pages welcome

## 2019-07-23 NOTE — Plan of Care (Signed)
  Problem: Cardiac: Goal: Ability to achieve and maintain adequate cardiopulmonary perfusion will improve Outcome: Progressing   Problem: Clinical Measurements: Goal: Diagnostic test results will improve Outcome: Progressing

## 2019-07-24 ENCOUNTER — Telehealth (INDEPENDENT_AMBULATORY_CARE_PROVIDER_SITE_OTHER): Payer: Medicare HMO | Admitting: Family Medicine

## 2019-07-24 LAB — CBC
HCT: 31.8 % — ABNORMAL LOW (ref 36.0–46.0)
Hemoglobin: 8.8 g/dL — ABNORMAL LOW (ref 12.0–15.0)
MCH: 24.7 pg — ABNORMAL LOW (ref 26.0–34.0)
MCHC: 27.7 g/dL — ABNORMAL LOW (ref 30.0–36.0)
MCV: 89.3 fL (ref 80.0–100.0)
Platelets: 517 10*3/uL — ABNORMAL HIGH (ref 150–400)
RBC: 3.56 MIL/uL — ABNORMAL LOW (ref 3.87–5.11)
RDW: 19.1 % — ABNORMAL HIGH (ref 11.5–15.5)
WBC: 9.7 10*3/uL (ref 4.0–10.5)
nRBC: 0 % (ref 0.0–0.2)

## 2019-07-24 LAB — GLUCOSE, CAPILLARY
Glucose-Capillary: 140 mg/dL — ABNORMAL HIGH (ref 70–99)
Glucose-Capillary: 143 mg/dL — ABNORMAL HIGH (ref 70–99)
Glucose-Capillary: 208 mg/dL — ABNORMAL HIGH (ref 70–99)
Glucose-Capillary: 120 mg/dL — ABNORMAL HIGH (ref 70–99)

## 2019-07-24 LAB — BASIC METABOLIC PANEL
Anion gap: 7 (ref 5–15)
BUN: 6 mg/dL — ABNORMAL LOW (ref 8–23)
CO2: 36 mmol/L — ABNORMAL HIGH (ref 22–32)
Calcium: 8.5 mg/dL — ABNORMAL LOW (ref 8.9–10.3)
Chloride: 99 mmol/L (ref 98–111)
Creatinine, Ser: 0.5 mg/dL (ref 0.44–1.00)
GFR calc Af Amer: >60 mL/min (ref >60)
GFR calc non Af Amer: >60 mL/min (ref >60)
Glucose, Bld: 117 mg/dL — ABNORMAL HIGH (ref 70–99)
Potassium: 3.4 mmol/L — ABNORMAL LOW (ref 3.5–5.1)
Sodium: 142 mmol/L (ref 135–145)

## 2019-07-24 MED ORDER — DIPHENHYDRAMINE HCL 25 MG PO CAPS
25.0000 mg | ORAL_CAPSULE | Freq: Once | ORAL | Status: AC | PRN
  Administered 2019-07-24: 25 mg via ORAL
  Filled 2019-07-24: qty 1

## 2019-07-24 MED ORDER — PROCHLORPERAZINE EDISYLATE 10 MG/2ML IJ SOLN
10.0000 mg | Freq: Once | INTRAMUSCULAR | Status: AC | PRN
  Administered 2019-07-24: 06:00:00 10 mg via INTRAVENOUS
  Filled 2019-07-24: qty 2

## 2019-07-24 MED ORDER — KETOROLAC TROMETHAMINE 30 MG/ML IJ SOLN
30.0000 mg | Freq: Once | INTRAMUSCULAR | Status: AC | PRN
  Administered 2019-07-24: 06:00:00 30 mg via INTRAVENOUS
  Filled 2019-07-24: qty 1

## 2019-07-24 MED ORDER — DEXAMETHASONE SODIUM PHOSPHATE 10 MG/ML IJ SOLN
10.0000 mg | Freq: Once | INTRAMUSCULAR | Status: AC | PRN
  Administered 2019-07-24: 06:00:00 10 mg via INTRAVENOUS
  Filled 2019-07-24 (×2): qty 1

## 2019-07-24 NOTE — Progress Notes (Addendum)
RN inform MD on call earlier that patient wanted Zofran. Tylenol and Tramadol given per prn order. Patient reporting that meds inefective in controlling headache. RN paged on call to request medication for migraine. On call came to the bedside to assess patient and obtain new order.  0600 pt not on bipap majority of night due to nausea, taking medications. Pt request to go on this am. Pt finally reported relief after "migraine cocktail"

## 2019-07-24 NOTE — Progress Notes (Signed)
Family Medicine Teaching Service Daily Progress Note Intern Pager: [phone]  Patient name: [name] White Medical record number: [id number] Date of birth: [birth year] Age: 64 y.o. Gender: female  Primary Care Provider: [name], MD Consultants: None Code Status: DNR  Pt Overview and Major Events to Date:  1/24-admitted  Assessment and Plan: [name] White is a 64 y.o. female presenting with shortness of breath. PMH is significant for vocal cord dysfunction status post partial thyroidectomy, COPD/asthma, hypertension, history of PE, A. fib on Eliquis, anxiety, depression, chronic back pain, GERD, falls.  Acute hypoxic respiratory failure Patient started on antibiotics 1/25 for concern of CAP.  CTA showed no evidence of pulmonary embolus but with bilateral lower lobe airspace opacities and dependent opacities in the upper lobes concerning for pneumonia.  CTA states aspiration pneumonia cannot be excluded.  Patient continues to have no fevers and no elevated white blood cell count.  Also reassured that procalcitonin less than 0.10.   -Continue BiPAP as needed -Maintain oxygen saturations greater than 90% -Follow blood cultures -PT/OT -Continuous pulse ox -Continuous cardiac monitoring -Strict I's and O's -Daily weights -Monitor fluid status -Discontinue ceftriaxone (1/25-1/26) -Continue doxycycline (day 3)  Left thigh abscess Ultrasound shows edema within soft tissue with very concerned small fluid collection 2.8 x 2.4 x 0.6 cm. - Consider General Surgery consult for I&D  Hypokalemia Current potassium 3.4.  Will replace with 40 K-Dur x2 daily (home med dosing) -Daily BMP -Replace potassium as needed  HFpEF 2 view chest x-ray suggested may be some component of pulmonary edema present.  BNP of 27.5.  Most recent ECHO 12/02, LVEF 60-65%, normal LV function, severe increased LV hypertrophy.  Home medication Lasix 80 mg daily. Chest xray w/o pulmonary edema. BNP 88.3  On  exam patient appears slightly volume overloaded with 2+ pitting edema, hard to determine, but may be dependant due to limited mobility. Overall, does not appear to be in heart failure.  I's and O's show -2.4 L since admission. -Continue home medication -Daily weights -Strict I's and O's  Chest pressure Troponin negative.  CTA negative for evidence of pulmonary embolus.  Patient with complaint of chest pressure during both this and recent admission.  During last admission had cardiac work-up and was thought the chest pressure was related to anxiety and respiratory distress as well as GERD and some capacity.   -Continue to monitor  OSA/OHS Patient with trilogy at home.  Patient believes part of the problem that led her to this hospitalization was a malfunction with her oxygen concentrator. -Continue BiPAP at night -Consult to social work for patient assistance in getting a new oxygen concentrator -consider sleep study outpatient  Vocal Cord Dysfunction S/p partial thyroidectomy  Paroxysmal AFIB Home medication Digoxin 120 mg qhs, Metoprolol 12.5 mg BID, Flecanide 100 mg BID.  Per prior notes patient may have a propensity to occasionally flip into A. fib which could increase her chest pressure/anxiety sensations. -Continue home medication  Diabetes Patient with recent diagnosis of diabetes during last admission in December.  HbA1C at that time of 7.8.  No home medications.  Lipid panel shows total cholesterol 197, LDL 106, HDL 82. -start sss insulin -monitor CBG -We will start statin  History of pulmonary embolism Home medication Eliquis.  Geneva score 8, moderate risk of PE.   -Continue home Eliquis  HTN Most recent BP 141/87. Home medication, Metopriolol, Cardizem, Lasix -Continue home medication  Morbid Obesity Current weight of 164kg, BMI 60.31. -encourage healthy lifestyle and weight management  ?  COPD/Asthma vs. Vocal Cord dysfunction Recent PFT's show no evidence  of COPD/Asthma. Possibly vocal cord dis on home oxygen 6L.  Followed by Dr.Byrum, Pulmonology.  Missed last appointment.  Home medications Montelukast, Albuterol, Duo-nebs, Dulera. Recommend avoiding systemic corticosteroids as this may worsen vocal cord dysfunction. -Outpatient Pulmonology vs. ENT/SLP follow up -continue home medications -monitor resp status   MDD/Anxiety Patient with a history of anxiety Home medications include Atarax 25 mg, Effexor 187.5 mg daily  -Continue home medications  GERD Home meds Prilosec 20 mg daily and Pepcid 20 mg.  -continue home meds   FEN/GI: Heart healthy/carb modified/n.p.o. while on BiPAP PPx: Eliquis  Disposition: Admitted to cardiac telemetry  Subjective:  Patient's only complaint this morning is that she still has a bit of a headache which she says is improved since last night.  Patient on BiPAP during my interview this morning breathing well stating that she feels her breathing is continue to improve.  I did talk to patient about the ultrasound findings of her left hip showing a small abscess, which we may have drained later today.  Patient continues on doxycycline as part of her treatment for possible CAP.  Objective: Temp:  [97.7 F (36.5 C)-98.9 F (37.2 C)] 97.7 F (36.5 C) (01/27 0419) Pulse Rate:  [93-107] 107 (01/27 0620) Resp:  [18-25] 24 (01/27 0620) BP: (118-146)/(62-87) 141/87 (01/26 2100) SpO2:  [88 %-97 %] 96 % (01/27 0620) Weight:  [169.6 kg] 169.6 kg (01/27 0419) Physical Exam: General: Alert and oriented in no apparent distress Heart: Regular rate and rhythm with no murmurs appreciated Lungs: CTA  Abdomen: Bowel sounds present, no abdominal pain  Laboratory: Recent Labs  Lab 07/21/19 1148 07/21/19 1148 07/21/19 1726 07/22/19 0616 07/24/19 0333  WBC 11.0*  --   --  9.5 9.7  HGB 9.4*   < > 11.6* 9.4* 8.8*  HCT 33.1*   < > 34.0* 32.0* 31.8*  PLT 564*  --   --  538* 517*   < > = values in this interval not  displayed.   Recent Labs  Lab 07/21/19 1148 07/21/19 1726 07/22/19 0616 07/23/19 0618 07/24/19 0333  NA 142   < > 143 143 142  K 2.4*   < > 3.3* 3.2* 3.4*  CL 100   < > 98 101 99  CO2 31   < > 34* 34* 36*  BUN 9   < > 6* 13 6*  CREATININE 0.53   < > 0.55 0.53 0.50  CALCIUM 8.7*   < > 8.6* 8.6* 8.5*  PROT 7.2  --   --   --   --   BILITOT 0.4  --   --   --   --   ALKPHOS 73  --   --   --   --   ALT 21  --   --   --   --   AST 22  --   --   --   --   GLUCOSE 107*   < > 131* 110* 117*   < > = values in this interval not displayed.      Imaging/Diagnostic Tests: [place] LT LOWER EXTREM LTD SOFT TISSUE NON VASCULAR  Result Date: 07/23/2019 CLINICAL DATA:  Left thigh abscess EXAM: ULTRASOUND LEFT LOWER EXTREMITY LIMITED TECHNIQUE: Ultrasound examination of the lower extremity soft tissues was performed in the area of clinical concern. COMPARISON:  None. FINDINGS: Ultrasound was performed in the area of concern in the posterior upper left  thigh. There is edema within the subcutaneous soft tissues. Small focal fluid collection is seen within the subcutaneous soft tissues which measures 2.8 x 2.4 x 0.6 cm and could reflect a small soft tissue abscess. IMPRESSION: Edema within the soft tissues in the area of concern with small focal fluid collection as above concerning for focal abscess. Electronically Signed   By: Rolm Baptise M.D.   On: 07/23/2019 20:44     Lurline Del, DO 07/24/2019, 6:26 AM PGY-1, [place] Intern pager: [phone], text pages welcome

## 2019-07-24 NOTE — Progress Notes (Addendum)
FPTS Interim Progress Note  Patient with migraine resistant to tramadol and tylenol. Unable to give exedrin as patient has listed caffeine allergy and also recently took tylenol. Will plan for migraine cocktail which includes 10mg  compazine, 25mg  benadryl, 30mg  toradol, 10mg  decadron. Confirmed dosing and components of migraine cocktail with pharmacy. Unable to give bolus of fluids as patient with HF history and per progress note from day team, patient appeared fluid overloaded.   Caroline More, DO 07/24/2019, 4:16 AM PGY-3, [place] Medicine Service pager [phone]

## 2019-07-24 NOTE — Progress Notes (Signed)
Bariatric bed ordered per Lew Dawes, MD verbal order at bedside. This RN had secretary order bed and stated it would be delivered to room ASAP. Will continue to monitor and include this information in hand off report a shift change.

## 2019-07-24 NOTE — Progress Notes (Signed)
Patient is still in the hospital at this time. Will mark as no charge, can follow up after hospitalization.  [name] MD PGY-3 Family Medicine Resident

## 2019-07-24 NOTE — Consult Note (Signed)
[name] White [birth year]  [id number].    Requesting MD: Dr. Vanessa Blakesburg Chief Complaint: SOB Reason for Consult: 2.8 x 2.4 x 0.6 cm fluid collection seen on ultrasound along left upper leg  HPI: [name] White is a 64 y.o. female with a history of HTN, COPD, PE on Eliquis, hypothyroidism, paroxysmal A. fib who was readmitted to the hospital on 1/24 for acute hypoxic respiratory failure 2/2 PNA.  Patient has been recently discharged from the hospital on 1/10.  We are asked to see her for a small fluid collection that was seen along the left lateral leg on ultrasound.  Patient reports that she first noticed this area during her prior admission ~2 weeks ago.  She reports that the area did spontaneously drain during prior admission.  Our team saw her for this on 1/9.  At the time the area appeared to be a large hematoma that was actively draining without signs of infection.  She reports that after being discharged the area stopped draining.  It began draining again on Saturday, 1/23.  She is unsure what the drainage looks like.  The area is tender to the touch.  She has not been running any fevers.  An ultrasound of the area was done that showed a small focal fluid collection seen within the subcutaneous soft tissues measuring 2.8 x 2.4 x 0.6 cm.  We are asked to see.  She is currently on doxycycline for pneumonia  ROS: Review of Systems  Unable to perform ROS: Other  Constitutional: Negative for chills and fever.  Respiratory: Positive for shortness of breath.   Skin:       Swelling and drainage along the left lateral leg  Patient on BiPAP   Family History  Problem Relation Age of Onset  . Osteoarthritis Mother   . Asthma Mother   . Heart failure Mother   . Breast cancer Daughter     Past Medical History:  Diagnosis Date  . Abdominal wall hematoma 03/06/2019  . Acute bronchitis 08/29/2018  . Acute on chronic respiratory failure with hypoxia ([place]) 08/29/2018  . Anxiety   . Asthma     . Chronic lower back pain   . COPD (chronic obstructive pulmonary disease) ([place])   . Depression   . Exposure to COVID-19 virus 11/02/2018  . Fall 02/2019  . Family history of anesthesia complication    "daughter; causes her to pass out afterwards"  . GERD (gastroesophageal reflux disease)   . History of atrial flutter 06/26/2015  . History of pulmonary embolus (PE) 11/03/2014  . Hypertension   . Hyperthyroidism   . Left medial tibial stress syndrome 12/26/2017  . Lower GI bleed   . Migraine    "monthly" (12/28/2013)  . Non-traumatic rhabdomyolysis 11/03/2014  . Obesity hypoventilation syndrome ([place]) 03/06/2019  . Osteoarthritis    "both knees; back of my neck; right pelvic bone" (12/28/2013)  . Paroxysmal A-fib ([place])   . Pulmonary embolism ([place]) 12/28/2013   "2 clots in each lung"    Past Surgical History:  Procedure Laterality Date  . ABDOMINAL HYSTERECTOMY    . APPENDECTOMY    . BREAST CYST EXCISION Right   . DILATION AND CURETTAGE OF UTERUS    . ELECTROPHYSIOLOGIC STUDY N/A 05/27/2015   Procedure: A-Flutter;  Surgeon: Evans Lance, MD;  Location: [facility];  Service: Cardiovascular;  Laterality: N/A;  . EXCISIONAL HEMORRHOIDECTOMY    . NASAL SINUS SURGERY  2007  . THYROIDECTOMY, PARTIAL Right  2005  . TUBAL LIGATION    . WISDOM TOOTH EXTRACTION      Social History:  reports that she quit smoking about 20 years ago. Her smoking use included cigarettes. She has a 5.00 pack-year smoking history. She has never used smokeless tobacco. She reports previous alcohol use. She reports that she does not use drugs.  Allergies:  Allergies  Allergen Reactions  . Caffeine Other (See Comments)    Migraine  . Hydralazine Hcl     Other reaction(s): Other (See Comments) AKI leading to rhabdo and electrolyte abnormalities  . Hydrocodone Nausea And Vomiting    Headache  . Ciprofloxacin Hives and Rash  . Erythromycin Hives and Rash  . Lisinopril Cough  . Oxycodone Nausea And Vomiting     Headache  . Sulfamethoxazole-Trimethoprim Rash    Medications Prior to Admission  Medication Sig Dispense Refill  . acetaminophen (TYLENOL) 325 MG tablet Take 2 tablets (650 mg total) by mouth every 6 (six) hours as needed for mild pain.    Marland Kitchen apixaban (ELIQUIS) 5 MG TABS tablet Take 1 tablet (5 mg total) by mouth 2 (two) times daily. 60 tablet 0  . azelastine (ASTELIN) 0.1 % nasal spray Place 2 sprays into both nostrils 2 (two) times daily. Use in each nostril as directed (Patient taking differently: Place 2 sprays into both nostrils daily. ) 30 mL 0  . azelastine (OPTIVAR) 0.05 % ophthalmic solution Place 1 drop into both eyes 2 (two) times daily as needed (itchy eyes).    . bacitracin ointment Apply topically 2 (two) times daily. 120 g 0  . cycloSPORINE (RESTASIS) 0.05 % ophthalmic emulsion Place 1 drop into both eyes 2 (two) times daily.    . diclofenac sodium (VOLTAREN) 1 % GEL APPLY 2 GRAMS EXTERNALLY TO THE AFFECTED AREA FOUR TIMES DAILY (Patient taking differently: Apply 2 g topically 4 (four) times daily as needed (arthritis pain - knees, shoulder and thighs). ) 100 g 0  . diltiazem (CARDIZEM CD) 120 MG 24 hr capsule Take 1 capsule (120 mg total) by mouth daily. (Patient taking differently: Take 120 mg by mouth at bedtime. ) 90 capsule 0  . famotidine (PEPCID) 20 MG tablet Take 1 tablet (20 mg total) by mouth daily. 90 tablet 0  . flecainide (TAMBOCOR) 100 MG tablet TAKE 1 TABLET BY MOUTH 2 TIMES A DAY. PLEASE KEEP UPCOMING APPOINTMENT IN JANUARY BEFORE ANYMORE REFILLS. (Patient taking differently: Take 100 mg by mouth 2 (two) times daily. ) 60 tablet 6  . fluticasone (FLONASE) 50 MCG/ACT nasal spray Place 2 sprays into both nostrils daily.     . furosemide (LASIX) 80 MG tablet Take 1 tablet (80 mg total) by mouth daily. 30 tablet 0  . hydrOXYzine (VISTARIL) 25 MG capsule Take 1 capsule (25 mg total) by mouth 2 (two) times daily as needed for anxiety. 30 capsule 0  .  ipratropium-albuterol (DUONEB) 0.5-2.5 (3) MG/3ML SOLN Take 3 mLs by nebulization every 12 (twelve) hours as needed (shortness of breath, wheezing). 360 mL 3  . loratadine (CLARITIN) 10 MG tablet TAKE 1 TABLET(10 MG) BY MOUTH DAILY (Patient taking differently: Take 10 mg by mouth daily. ) 90 tablet 0  . Melatonin 3 MG TABS Take 1 tablet (3 mg total) by mouth at bedtime as needed (sleep). 30 tablet 0  . metoprolol tartrate (LOPRESSOR) 25 MG tablet Take 0.5 tablets (12.5 mg total) by mouth 2 (two) times daily. 60 tablet 0  . mometasone-formoterol (DULERA) 100-5 MCG/ACT AERO Inhale  2 puffs into the lungs 2 (two) times daily. 13 g 0  . montelukast (SINGULAIR) 10 MG tablet Take 1 tablet (10 mg total) by mouth at bedtime. 30 tablet 0  . omeprazole (PRILOSEC) 20 MG capsule Take 1 capsule (20 mg total) by mouth daily. 30 capsule 0  . OXYGEN Inhale 3 L into the lungs continuous.     . potassium chloride SA (KLOR-CON) 20 MEQ tablet Take 2 tablets (40 mEq total) by mouth 2 (two) times daily. 120 tablet 0  . topiramate (TOPAMAX) 25 MG tablet TAKE 1 TABLET(25 MG) BY MOUTH DAILY (Patient taking differently: Take 25 mg by mouth daily. ) 90 tablet 0  . traMADol (ULTRAM) 50 MG tablet Take 1 tablet (50 mg total) by mouth every 12 (twelve) hours as needed (breakthrough pain. Must have tried Tylenol first.). 30 tablet 0  . umeclidinium bromide (INCRUSE ELLIPTA) 62.5 MCG/INH AEPB Inhale 1 puff into the lungs daily. 30 each 0  . venlafaxine (EFFEXOR) 37.5 MG tablet Take 37.5 mg by mouth daily with breakfast. Take with a 150 mg ER capsule every morning    . venlafaxine XR (EFFEXOR-XR) 150 MG 24 hr capsule Take 1 capsule (150 mg total) by mouth daily with breakfast. Take 150mg  capsule with 37.5mg  tablet (Patient taking differently: Take 150 mg by mouth daily with breakfast. Take with a 37.5 mg tablet every morning)    . VENTOLIN HFA 108 (90 Base) MCG/ACT inhaler INHALE 2 PUFFS INTO THE LUNGS EVERY 6 HOURS AS NEEDED FOR  WHEEZING OR SHORTNESS OF BREATH (Patient taking differently: Inhale 2 puffs into the lungs every 6 (six) hours as needed for wheezing or shortness of breath. ) 18 g 0  . albuterol (PROVENTIL) (2.5 MG/3ML) 0.083% nebulizer solution Take 3 mLs (2.5 mg total) by nebulization every 6 (six) hours as needed for wheezing or shortness of breath. (Patient not taking: Reported on 06/15/2019) 75 mL 4  . guaiFENesin-dextromethorphan (ROBITUSSIN DM) 100-10 MG/5ML syrup Take 5 mLs by mouth every 4 (four) hours as needed for cough. (Patient not taking: Reported on 07/21/2019) 118 mL 0     Physical Exam: Blood pressure 126/70, pulse 79, temperature 97.7 F (36.5 C), temperature source Oral, resp. rate 18, height 5\' 5"  (1.651 m), weight (!) 169.6 kg, SpO2 99 %. General: pleasant, obese female who is laying in bed on BiPAP HEENT: head is normocephalic, atraumatic.  Sclera are noninjected.  PERRL.  Ears and nose without any masses or lesions.  Mouth appears pink and moist  Heart: regular rate, and Irr rhythm.  Normal s1,s2. No obvious murmurs, gallops, or rubs noted.  Palpable radial and pedal pulses bilaterally Lungs: CTAB, no wheezes, rhonchi, or rales noted.  Respiratory effort nonlabored Abd: soft, NT, ND, +BS, no masses, hernias, or organomegaly MS: all 4 extremities are symmetrical with no cyanosis or clubbing. Non-pitting lower extremity edema noted.  Skin: Please see diagram below. Along the left lateral/posterior thigh/buttock there was a 2cm x 2.5cm fluctuant area with a small pin sized opening at the base draining clear fluid. I was able to probe this area with the wooden end of a sterile cotton swab to open it further and express serous mixed with yellow/green purulent fluid. The fluctuance fully decompressed and edges of the cavity could be palpated. The overlying/surrounding area was without induration, erythema or heat to suggest cellulitis. Skin was otherwise warm and dry with no masses, lesions, or  rashes  Neuro: Cranial nerves 2-12 grossly intact, speech muffled by BiPAP Psych:  Alert, A&Ox3 with an appropriate affect.   Results for orders placed or performed during the hospital encounter of 07/21/19 (from the past 48 hour(s))  Glucose, capillary     Status: Abnormal   Collection Time: 07/22/19  4:34 PM  Result Value Ref Range   Glucose-Capillary 133 (H) 70 - 99 mg/dL  Glucose, capillary     Status: Abnormal   Collection Time: 07/22/19  9:28 PM  Result Value Ref Range   Glucose-Capillary 146 (H) 70 - 99 mg/dL  Basic metabolic panel     Status: Abnormal   Collection Time: 07/23/19  6:18 AM  Result Value Ref Range   Sodium 143 135 - 145 mmol/L   Potassium 3.2 (L) 3.5 - 5.1 mmol/L   Chloride 101 98 - 111 mmol/L   CO2 34 (H) 22 - 32 mmol/L   Glucose, Bld 110 (H) 70 - 99 mg/dL   BUN 13 8 - 23 mg/dL   Creatinine, Ser 0.53 0.44 - 1.00 mg/dL   Calcium 8.6 (L) 8.9 - 10.3 mg/dL   GFR calc non Af Amer >60 >60 mL/min   GFR calc Af Amer >60 >60 mL/min   Anion gap 8 5 - 15    Comment: Performed at [facility], [address] [place], [place] [address]  Glucose, capillary     Status: Abnormal   Collection Time: 07/23/19  8:03 AM  Result Value Ref Range   Glucose-Capillary 105 (H) 70 - 99 mg/dL  Glucose, capillary     Status: Abnormal   Collection Time: 07/23/19 11:43 AM  Result Value Ref Range   Glucose-Capillary 136 (H) 70 - 99 mg/dL  Glucose, capillary     Status: None   Collection Time: 07/23/19  4:42 PM  Result Value Ref Range   Glucose-Capillary 89 70 - 99 mg/dL  Glucose, capillary     Status: None   Collection Time: 07/23/19  9:21 PM  Result Value Ref Range   Glucose-Capillary 86 70 - 99 mg/dL  Basic metabolic panel     Status: Abnormal   Collection Time: 07/24/19  3:33 AM  Result Value Ref Range   Sodium 142 135 - 145 mmol/L   Potassium 3.4 (L) 3.5 - 5.1 mmol/L   Chloride 99 98 - 111 mmol/L   CO2 36 (H) 22 - 32 mmol/L   Glucose, Bld 117 (H) 70 - 99 mg/dL    BUN 6 (L) 8 - 23 mg/dL   Creatinine, Ser 0.50 0.44 - 1.00 mg/dL   Calcium 8.5 (L) 8.9 - 10.3 mg/dL   GFR calc non Af Amer >60 >60 mL/min   GFR calc Af Amer >60 >60 mL/min   Anion gap 7 5 - 15    Comment: Performed at [facility], [place] [address], [place], [place] [address]  CBC     Status: Abnormal   Collection Time: 07/24/19  3:33 AM  Result Value Ref Range   WBC 9.7 4.0 - 10.5 K/uL   RBC 3.56 (L) 3.87 - 5.11 MIL/uL   Hemoglobin 8.8 (L) 12.0 - 15.0 g/dL   HCT 31.8 (L) 36.0 - 46.0 %   MCV 89.3 80.0 - 100.0 fL   MCH 24.7 (L) 26.0 - 34.0 pg   MCHC 27.7 (L) 30.0 - 36.0 g/dL   RDW 19.1 (H) 11.5 - 15.5 %   Platelets 517 (H) 150 - 400 K/uL   nRBC 0.0 0.0 - 0.2 %    Comment: Performed at [facility], [place] [address], [place],  [address]  Glucose, capillary     Status: Abnormal   Collection Time: 07/24/19  7:52 AM  Result Value Ref Range   Glucose-Capillary 140 (H) 70 - 99 mg/dL  Glucose, capillary     Status: Abnormal   Collection Time: 07/24/19 12:08 PM  Result Value Ref Range   Glucose-Capillary 143 (H) 70 - 99 mg/dL   [place] LT LOWER EXTREM LTD SOFT TISSUE NON VASCULAR  Result Date: 07/23/2019 CLINICAL DATA:  Left thigh abscess EXAM: ULTRASOUND LEFT LOWER EXTREMITY LIMITED TECHNIQUE: Ultrasound examination of the lower extremity soft tissues was performed in the area of clinical concern. COMPARISON:  None. FINDINGS: Ultrasound was performed in the area of concern in the posterior upper left thigh. There is edema within the subcutaneous soft tissues. Small focal fluid collection is seen within the subcutaneous soft tissues which measures 2.8 x 2.4 x 0.6 cm and could reflect a small soft tissue abscess. IMPRESSION: Edema within the soft tissues in the area of concern with small focal fluid collection as above concerning for focal abscess. Electronically Signed   By: Rolm Baptise M.D.   On: 07/23/2019 20:44   Anti-infectives (From admission, onward)   Start     Dose/Rate  Route Frequency Ordered Stop   07/22/19 1500  doxycycline (VIBRA-TABS) tablet 100 mg     100 mg Oral Every 12 hours 07/22/19 1449 07/29/19 0959   07/22/19 1445  cefTRIAXone (ROCEPHIN) 1 g in sodium chloride 0.9 % 100 mL IVPB  Status:  Discontinued     1 g 200 mL/hr over 30 Minutes Intravenous Every 24 hours 07/22/19 1449 07/24/19 1223        Assessment/Plan HTN COPD PE on Eliquis Hypothyroidism Paroxysmal A. fib PNA - Per TRH -   Left Thigh/Gluteal Abscess -On my assessment the area was already spontaneously draining.  I was able to open this up further with the wooden end of a sterile cotton swab.  It appears the area is now fully decompressed. -Recommend warm compresses to the area 4 times daily -Doxycycline she is currently on for pneumonia will also cover for this -Will recheck in the morning to ensure area has not re accumulated  FEN - HH VTE - SCDs, Eliquis  ID - Doxycycline   Jillyn Ledger, [facility] 07/24/2019, 1:50 PM Please see Amion for pager number during day hours 7:00am-4:30pm

## 2019-07-24 NOTE — Progress Notes (Signed)
Patient called RT to beside. Patient some SOB and wants to go back on BIPAP. RT placed patient back on BIPAP 12/5 and 50%. Patient tolerating well at this time.

## 2019-07-24 NOTE — Telephone Encounter (Signed)
Pt still in the hospital 07/24/19/ta

## 2019-07-24 NOTE — Progress Notes (Signed)
FPTS Interim Progress Note  S:Patient reports 8/10 HA located on left side above and including left eye. Patient reports continued nausea despite having Zofran 4 hours ago.  Patient reports the pain is throbbing in nature and that she is experiencing photosensitivity and sensitivity to sound and has turned off her lights and television.    O: BP (!) 141/87 (BP Location: Left Arm)   Pulse 97   Temp 98.9 F (37.2 C) (Oral)   Resp (!) 23   Ht 5\' 5"  (1.651 m)   Wt (!) 164.4 kg   SpO2 96%   BMI 60.31 kg/m   Neuro: Patient with 5 out of 5 strength in upper and lower extremities, no decrease sensation, defer light exam of pupils given photosensitivity, patient with normal range of motion of the extremities, patient alert and oriented x4  A/P: Most likely migraine headache Patient's headache is most consistent with migraine headache of moderate to severe nature.  Differential includes medication overuse headache as patient states that she uses Excedrin Migraine at home, patient could also be experiencing migraine as a result of unknowingly consuming caffeine recently and this is listed as one of her allergies. -Migraine cocktail ordered   Stark Klein, MD 07/24/2019, 4:05 AM PGY-1, [place] Service pager [phone]

## 2019-07-24 NOTE — Progress Notes (Signed)
PT Cancellation Note  Patient Details Name: [name] White MRN: [id number] DOB: [birth year]   Cancelled Treatment:    Reason Eval/Treat Not Completed: Other (comment) RN reports patient did not sleep well last night, sleeping very soundly and still on BiPAP. Ok to try later in the afternoon with hopes she will be a little more awake. Will attempt to return if time/schedule allow.    Windell Norfolk, DPT, PN1   Supplemental Physical Therapist [facility]    Pager [phone] Acute Rehab Office [phone]

## 2019-07-24 NOTE — Evaluation (Signed)
Occupational Therapy Evaluation Patient Details Name: [name] White MRN: [id number] DOB: 1956-05-26 Today's Date: 07/24/2019    History of Present Illness 64 yo female 1/24 for acute hypoxic respiratory failure 2/2 PNA. pt found to have small fluid collection L lateral leg  pmh: HTn COPD AFIB recent d/c 07/07/19 osteoarthritis    Clinical Impression   PT admitted with  Respiratory failure 2/2 PNA. Pt currently with functional limitiations due to the deficits listed below (see OT problem list). Pt transferred min guard with RW from EOB to chair this session. Pt able to progress to eob but required HOB elevated and bed rails. Pt will need hospital bed to replicate at home or family (A)  Pt will benefit from skilled OT to increase their independence and safety with adls and balance to allow discharge [place].     Follow Up Recommendations  Home health OT    Equipment Recommendations  None recommended by OT    Recommendations for Other Services       Precautions / Restrictions Precautions Precautions: Fall Precaution Comments: watch O2 stat, pt also anxious      Mobility Bed Mobility Overal bed mobility: Needs Assistance Bed Mobility: Supine to Sit     Supine to sit: Min guard     General bed mobility comments: HOB fully elevated and heavy use of bed rails. static sititng EOB > 10 minutes to eat breakfast due to sleeping all morning  Transfers Overall transfer level: Needs assistance Equipment used: Rolling walker (2 wheeled) Transfers: Stand Pivot Transfers;Sit to/from Stand Sit to Stand: Supervision Stand pivot transfers: Supervision            Balance                                           ADL either performed or assessed with clinical judgement   ADL Overall ADL's : Needs assistance/impaired Eating/Feeding: Independent   Grooming: Modified independent   Upper Body Bathing: Min guard   Lower Body Bathing: Moderate assistance   Upper  Body Dressing : Min guard   Lower Body Dressing: Moderate assistance               Functional mobility during ADLs: Min guard;Rolling walker General ADL Comments: Pt eating on EOb and then transfered to chair     Vision Baseline Vision/History: Wears glasses Wears Glasses: Reading only Additional Comments: uses eye drops for dry eyes     Perception     Praxis      Pertinent Vitals/Pain Pain Assessment: 0-10 Pain Score: 5  Pain Location: legs and calf at eob  Pain Descriptors / Indicators: Discomfort;Sore Pain Intervention(s): Monitored during session;Repositioned     Hand Dominance Right   Extremity/Trunk Assessment Upper Extremity Assessment Upper Extremity Assessment: Overall WFL for tasks assessed       Cervical / Trunk Assessment Cervical / Trunk Assessment: Normal   Communication Communication Communication: No difficulties   Cognition Arousal/Alertness: Awake/alert Behavior During Therapy: WFL for tasks assessed/performed Overall Cognitive Status: Within Functional Limits for tasks assessed                                     General Comments   6L HR 90 126/70     Exercises     Shoulder Instructions  Home Living Family/patient expects to be discharged to:: Private residence Living Arrangements: Other relatives Available Help at Discharge: Family;Available PRN/intermittently;Other (Comment) Type of Home: Apartment Home Access: Stairs to enter Entrance Stairs-Number of Steps: 1 Entrance Stairs-Rails: None Home Layout: One level     Bathroom Shower/Tub: Teacher, early years/pre: Standard     Home Equipment: Environmental consultant - 2 wheels;Cane - single point;Walker - 4 wheels;Hospital bed;Other (comment);Tub bench;Bedside commode Adaptive Equipment: Reacher;Sock aid;Long-handled shoe horn;Long-handled sponge Additional Comments: lives with niece      Prior Functioning/Environment Level of Independence: Needs assistance   Gait / Transfers Assistance Needed: Per pt goes from couch to [facility] and back (only a couple of feet) her hospital bed is broken and she is waiting on a new one, she wears 3L O2 [place] at baseline, but increased to 5L "as I was getting sick" [facility] HHPT and OT [facility]. ADL's / Homemaking Assistance Needed: wears pull up and uses bed side bath, no wheelchair to get to bathroom, uses 3n1 for bowel movement only    Comments: ambulates short household distances with RW, requires setup/supervision with ADLs        OT Problem List: Decreased strength;Decreased activity tolerance;Impaired balance (sitting and/or standing);Decreased safety awareness;Decreased knowledge of use of DME or AE;Decreased knowledge of precautions;Obesity;Pain      OT Treatment/Interventions: Self-care/ADL training;Therapeutic exercise;DME and/or AE instruction;Therapeutic activities;Patient/family education;Balance training    OT Goals(Current goals can be found in the care plan section) Acute Rehab OT Goals Patient Stated Goal: Go home OT Goal Formulation: With patient Time For Goal Achievement: 08/07/19 Potential to Achieve Goals: Good  OT Frequency: Min 2X/week   Barriers to D/C:            Co-evaluation              AM-PAC OT "6 Clicks" Daily Activity     Outcome Measure Help from another person eating meals?: None Help from another person taking care of personal grooming?: None Help from another person toileting, which includes using toliet, bedpan, or urinal?: A Little Help from another person bathing (including washing, rinsing, drying)?: A Little Help from another person to put on and taking off regular upper body clothing?: A Little Help from another person to put on and taking off regular lower body clothing?: A Lot 6 Click Score: 19   End of Session Equipment Utilized During Treatment: Oxygen Nurse Communication: Mobility status;Precautions  Activity Tolerance: Patient tolerated treatment  well Patient left: in chair;with call bell/phone within reach;with chair alarm set  OT Visit Diagnosis: Unsteadiness on feet (R26.81);Muscle weakness (generalized) (M62.81)                Time: 1335-1401 OT Time Calculation (min): 26 min Charges:  OT General Charges $OT Visit: 1 Visit OT Evaluation $OT Eval Moderate Complexity: 1 Mod OT Treatments $Self Care/Home Management : 8-22 mins   Brynn, OTR/L  Acute Rehabilitation Services Pager: [phone] Office: [phone] .   Jeri Modena 07/24/2019, 3:10 PM

## 2019-07-24 NOTE — TOC Progression Note (Signed)
Transition of Care [facility]) - Progression Note    Patient Details  Name: [name] White MRN: [id number] Date of Birth: [birth year]  Transition of Care [facility]) CM/SW Contact  Graves-Bigelow, Ocie Cornfield, RN Phone Number: 07/24/2019, 1:19 PM  Clinical Narrative:  Case Manager reached out to the niece with verbal permission of patient. Patient has received all durable medical equipment: oxygen/concentrator, nebulizer machine, wheelchair from [employer]. [employer] was unable to take the gel overlay off the mattress- Case Manager did call [employer] Liaison to see if he can assist. Case Manager will continue to follow for additional transition of care needs.     Expected Discharge Plan: [place] Barriers to Discharge: Continued Medical Work up  Expected Discharge Plan and Services Expected Discharge Plan: [place] In-house Referral: NA Discharge Planning Services: CM Consult Post Acute Care Choice: Resumption of Svcs/PTA Provider, Home Health(Active with [facility]) Living arrangements for the past 2 months: Apartment                 DME Arranged: Oxygen DME Agency: [employer] Date DME Agency Contacted: 07/22/19 Time DME Agency Contacted: [phone] Representative spoke with at DME Agency: Thedore Mins HH Arranged: RN, Disease Management, PT, OT, Nurse's Aide [place] Agency: [place] ([place]) Date [place]: 07/22/19 Time HH Agency Contacted: 1300 Representative spoke with at [place]: [place] ([place]) Interventions    Readmission Risk Interventions Readmission Risk Prevention Plan 07/22/2019 03/08/2019  Transportation Screening Complete Complete  Medication Review Press photographer) Complete Complete  PCP or Specialist appointment within 3-5 days of discharge Complete Complete  HRI or [place] Complete Complete  SW Recovery Care/Counseling Consult Complete Complete  Palliative Care Screening Not Applicable Not  [place] Not Applicable Not Applicable  Some recent data might be hidden

## 2019-07-25 DIAGNOSIS — J441 Chronic obstructive pulmonary disease with (acute) exacerbation: Secondary | ICD-10-CM

## 2019-07-25 LAB — GLUCOSE, CAPILLARY
Glucose-Capillary: 129 mg/dL — ABNORMAL HIGH (ref 70–99)
Glucose-Capillary: 104 mg/dL — ABNORMAL HIGH (ref 70–99)
Glucose-Capillary: 74 mg/dL (ref 70–99)

## 2019-07-25 MED ORDER — DOXYCYCLINE HYCLATE 100 MG PO TABS: 100.0000 mg | ORAL_TABLET | Freq: Two times a day (BID) | ORAL | 0 refills | Status: AC

## 2019-07-25 MED ORDER — POLYETHYLENE GLYCOL 3350 17 G PO PACK: 17.0000 g | PACK | Freq: Every day | ORAL | 0 refills | Status: DC | PRN

## 2019-07-25 MED ORDER — ATORVASTATIN CALCIUM 40 MG PO TABS: 40.0000 mg | ORAL_TABLET | Freq: Every day | ORAL | 3 refills | Status: DC

## 2019-07-25 MED ORDER — TOPIRAMATE 50 MG PO TABS: 50.0000 mg | ORAL_TABLET | Freq: Every day | ORAL | 3 refills | Status: DC

## 2019-07-25 NOTE — Progress Notes (Signed)
Subjective No acute events. Doing great. Denies complaints. Denies leg pain.  Objective: Vital signs in last 24 hours: Temp:  [97.6 F (36.4 C)-98.2 F (36.8 C)] 98.2 F (36.8 C) (01/28 0738) Pulse Rate:  [68-114] 87 (01/28 0738) Resp:  [15-27] 23 (01/28 0738) BP: (105-135)/(66-96) 126/66 (01/28 0738) SpO2:  [97 %-100 %] 98 % (01/28 0738) FiO2 (%):  [50 %] 50 % (01/27 0833) Weight:  [165.4 kg] 165.4 kg (01/28 0450) Last BM Date: 07/23/19  Intake/Output from previous day: 01/27 0701 - 01/28 0700 In: 444 [P.O.:444] Out: 1150 [Urine:1150] Intake/Output this shift: No intake/output data recorded.  Gen: NAD, comfortable CV: RRR Pulm: Normal work of breathing Ext: SCDs in place; L lateral thigh without erythema or fluctuance/drainage  Lab Results: CBC  Recent Labs    07/24/19 0333  WBC 9.7  HGB 8.8*  HCT 31.8*  PLT 517*   BMET Recent Labs    07/23/19 0618 07/24/19 0333  NA 143 142  K 3.2* 3.4*  CL 101 99  CO2 34* 36*  GLUCOSE 110* 117*  BUN 13 6*  CREATININE 0.53 0.50  CALCIUM 8.6* 8.5*   PT/INR No results for input(s): LABPROT, INR in the last 72 hours. ABG No results for input(s): PHART, HCO3 in the last 72 hours.  Invalid input(s): PCO2, PO2  Studies/Results:  Anti-infectives: Anti-infectives (From admission, onward)   Start     Dose/Rate Route Frequency Ordered Stop   07/22/19 1500  doxycycline (VIBRA-TABS) tablet 100 mg     100 mg Oral Every 12 hours 07/22/19 1449 07/29/19 0959   07/22/19 1445  cefTRIAXone (ROCEPHIN) 1 g in sodium chloride 0.9 % 100 mL IVPB  Status:  Discontinued     1 g 200 mL/hr over 30 Minutes Intravenous Every 24 hours 07/22/19 1449 07/24/19 1223       Assessment/Plan: Patient Active Problem List   Diagnosis Date Noted  . Acute hypoxemic respiratory failure ([place]) 07/21/2019  . Hypertension associated with diabetes ([place])   . Fever   . COPD (chronic obstructive pulmonary disease) ([place]) 06/21/2019  . Community acquired  pneumonia 06/14/2019  . Weight gain due to medication 06/05/2019  . Acute respiratory failure with hypoxia ([place]) 05/28/2019  . Bronchitis with acute wheezing 05/09/2019  . Hyperglycemia 03/19/2019  . Acute on chronic respiratory failure with hypoxemia ([place]) 03/14/2019  . Acute on chronic heart failure with preserved ejection fraction ([place]) 03/14/2019  . Acquired thrombophilia ([place]) due to A-Fib 03/07/2019  . Heart failure with preserved ejection fraction ([place]) 03/06/2019  . Obesity hypoventilation syndrome ([place]) 03/06/2019  . Lower GI bleed   . Urge incontinence 01/27/2019  . COPD exacerbation ([place]) 08/29/2018  . COPD with acute exacerbation ([place]) 08/07/2018  . OSA (obstructive sleep apnea) 07/05/2018  . Respiratory failure ([place]) 07/05/2018  . Severe episode of recurrent major depressive disorder, without psychotic features ([place])   . MDD (major depressive disorder), recurrent episode, severe ([place]) 09/07/2015  . Atrial flutter ([place]) 07/29/2015  . Asthma 11/12/2014  . History of hypertension 11/05/2014  . Paroxysmal atrial fibrillation ([place]) 11/03/2014  . Chronic anticoagulation 11/03/2014  . Major depressive disorder 11/03/2014  . History of pulmonary embolus (PE) 11/03/2014  . MDD (major depressive disorder), recurrent severe, without psychosis ([place]) 03/05/2014  . Vocal cord dysfunction 11/12/2013  . Nonintractable headache 01/06/2012  . Hot flashes 04/22/2011  . OVERACTIVE BLADDER 04/18/2008  . Osteoarthritis of both knees 04/18/2008  . Osteoarthritis involving multiple joints on both sides of body 09/14/2007  . Morbid  obesity ([place]) 08/24/2006  . RESTLESS LEGS SYNDROME 08/24/2006  . HYPERTENSION, BENIGN SYSTEMIC 08/24/2006  . RHINITIS, ALLERGIC 08/24/2006  . GASTROESOPHAGEAL REFLUX, NO ESOPHAGITIS 08/24/2006   -Doing well, no evidence of abscess at this time -No plans for surgery at this juncture -We will sign off   LOS: 3 days   Sharon Mt. Dema Severin, M.D. [facility] Use [email or web address] to contact on call provider

## 2019-07-25 NOTE — Discharge Instructions (Signed)
Acute Respiratory Failure, Adult  Acute respiratory failure occurs when there is not enough oxygen passing from your lungs to your body. When this happens, your lungs have trouble removing carbon dioxide from the blood. This causes your blood oxygen level to drop too low as carbon dioxide builds up. Acute respiratory failure is a medical emergency. It can develop quickly, but it is temporary if treated promptly. Your lung capacity, or how much air your lungs can hold, may improve with time, exercise, and treatment. What are the causes? There are many possible causes of acute respiratory failure, including:  Lung injury.  Chest injury or damage to the ribs or tissues near the lungs.  Lung conditions that affect the flow of air and blood into and out of the lungs, such as pneumonia, acute respiratory distress syndrome, and cystic fibrosis.  Medical conditions, such as strokes or spinal cord injuries, that affect the muscles and nerves that control breathing.  Blood infection (sepsis).  Inflammation of the pancreas (pancreatitis).  A blood clot in the lungs (pulmonary embolism).  A large-volume blood transfusion.  Burns.  Near-drowning.  Seizure.  Smoke inhalation.  Reaction to medicines.  Alcohol or drug overdose. What increases the risk? This condition is more likely to develop in people who have:  A blocked airway.  Asthma.  A condition or disease that damages or weakens the muscles, nerves, bones, or tissues that are involved in breathing.  A serious infection.  A health problem that blocks the unconscious reflex that is involved in breathing, such as hypothyroidism or sleep apnea.  A lung injury or trauma. What are the signs or symptoms? Trouble breathing is the main symptom of acute respiratory failure. Symptoms may also include:  Rapid breathing.  Restlessness or anxiety.  Skin, lips, or fingernails that appear blue (cyanosis).  Rapid heart  rate.  Abnormal heart rhythms (arrhythmias).  Confusion or changes in behavior.  Tiredness or loss of energy.  Feeling sleepy or having a loss of consciousness. How is this diagnosed? Your health care provider can diagnose acute respiratory failure with a medical history and physical exam. During the exam, your health care provider will listen to your heart and check for crackling or wheezing sounds in your lungs. Your may also have tests to confirm the diagnosis and determine what is causing respiratory failure. These tests may include:  Measuring the amount of oxygen in your blood (pulse oximetry). The measurement comes from a small device that is placed on your finger, earlobe, or toe.  Other blood tests to measure blood gases and to look for signs of infection.  Sampling your cerebral spinal fluid or tracheal fluid to check for infections.  Chest X-ray to look for fluid in spaces that should be filled with air.  Electrocardiogram (ECG) to look at the heart's electrical activity. How is this treated? Treatment for this condition usually takes places in a hospital intensive care unit (ICU). Treatment depends on what is causing the condition. It may include one or more treatments until your symptoms improve. Treatment may include:  Supplemental oxygen. Extra oxygen is given through a tube in the nose, a face mask, or a hood.  A device such as a continuous positive airway pressure (CPAP) or bi-level positive airway pressure (BiPAP or BPAP) machine. This treatment uses mild air pressure to keep the airways open. A mask or other device will be placed over your nose or mouth. A tube that is connected to a motor will deliver oxygen through the   mask.  Ventilator. This treatment helps move air into and out of the lungs. This may be done with a bag and mask or a machine. For this treatment, a tube is placed in your windpipe (trachea) so air and oxygen can flow to the lungs.  Extracorporeal  membrane oxygenation (ECMO). This treatment temporarily takes over the function of the heart and lungs, supplying oxygen and removing carbon dioxide. ECMO gives the lungs a chance to recover. It may be used if a ventilator is not effective.  Tracheostomy. This is a procedure that creates a hole in the neck to insert a breathing tube.  Receiving fluids and medicines.  Rocking the bed to help breathing. Follow these instructions at home:  Take over-the-counter and prescription medicines only as told by your health care provider.  Return to normal activities as told by your health care provider. Ask your health care provider what activities are safe for you.  Keep all follow-up visits as told by your health care provider. This is important. How is this prevented? Treating infections and medical conditions that may lead to acute respiratory failure can help prevent the condition from developing. Contact a health care provider if:  You have a fever.  Your symptoms do not improve or they get worse. Get help right away if:  You are having trouble breathing.  You lose consciousness.  Your have cyanosis or turn blue.  You develop a rapid heart rate.  You are confused. These symptoms may represent a serious problem that is an emergency. Do not wait to see if the symptoms will go away. Get medical help right away. Call your local emergency services (911 in the [place] Do not drive yourself to the hospital. This information is not intended to replace advice given to you by your health care provider. Make sure you discuss any questions you have with your health care provider. Document Revised: 05/26/2017 Document Reviewed: 12/30/2015 Elsevier Patient Education  2020 Elsevier Inc.  

## 2019-07-25 NOTE — Evaluation (Signed)
Physical Therapy Evaluation Patient Details Name: [name] White MRN: [id number] DOB: Jan 31, 1956 Today's Date: 07/25/2019   History of Present Illness  64 yo female 1/24 for acute hypoxic respiratory failure 2/2 PNA. pt found to have small fluid collection L lateral leg  pmh: HTn COPD AFIB recent d/c 07/07/19 osteoarthritis   Clinical Impression   Patient received in bed, pleasant and willing to work with therapy today. Mobility/assist levels as below. Needed help stabilizing RW but physically able to stand with S, became quite tremulous and weak in standing so deferred gait and focused on transfer to recliner- able to do so with min guard/RW. VSS on 6LPM per HFNC this session. Examined her R ankle, which she states got bumped when she was getting a CT earlier, sore to palpation anterior-laterally but no bruising or open wounds noted and MMT 4+/5 PF and DF- instructed her in ankle pumps and circles to promote joint mobility and reduce BLE edema. Left up in chair with all needs met, RN present and attending. Appropriate for HHPT.      Follow Up Recommendations Home health PT;Supervision for mobility/OOB    Equipment Recommendations  Hospital bed(otherwise well equipped)    Recommendations for Other Services       Precautions / Restrictions Precautions Precautions: Fall Precaution Comments: watch O2 stat, pt also anxious Restrictions Weight Bearing Restrictions: No      Mobility  Bed Mobility Overal bed mobility: Needs Assistance Bed Mobility: Supine to Sit     Supine to sit: Min guard     General bed mobility comments: HOB only moderately elevated, use of bed rails, increased time and effort  Transfers Overall transfer level: Needs assistance Equipment used: Rolling walker (2 wheeled) Transfers: Sit to/from [facility] Sit to Stand: Supervision Stand pivot transfers: Min guard       General transfer comment: S for safety when boosting to full stand, Min  guard for safety when taking pivotal steps during transfer due to BLE shaking/knee pain  Ambulation/Gait             General Gait Details: deferred- BLE weakness and tremulousness just transferring  Stairs            Wheelchair Mobility    Modified Rankin (Stroke Patients Only)       Balance Overall balance assessment: Needs assistance Sitting-balance support: Feet supported Sitting balance-Leahy Scale: Normal     Standing balance support: Bilateral upper extremity supported Standing balance-Leahy Scale: Poor Standing balance comment: reliant on external support                             Pertinent Vitals/Pain Pain Assessment: No/denies pain Pain Score: 0-No pain Faces Pain Scale: No hurt Pain Intervention(s): Limited activity within patient's tolerance;Monitored during session    Home Living                        Prior Function                 Hand Dominance        Extremity/Trunk Assessment   Upper Extremity Assessment Upper Extremity Assessment: Defer to OT evaluation    Lower Extremity Assessment Lower Extremity Assessment: Generalized weakness    Cervical / Trunk Assessment Cervical / Trunk Assessment: Normal  Communication      Cognition Arousal/Alertness: Awake/alert Behavior During Therapy: WFL for tasks assessed/performed Overall Cognitive Status: Within Functional Limits for  tasks assessed                                        General Comments General comments (skin integrity, edema, etc.): HFNC 6LPM from O2 tank, VSS    Exercises     Assessment/Plan    PT Assessment Patient needs continued PT services  PT Problem List Decreased strength;Decreased activity tolerance;Decreased balance;Decreased mobility;Decreased knowledge of precautions;Decreased knowledge of use of DME;Obesity       PT Treatment Interventions DME instruction;Gait training;Functional mobility  training;Therapeutic activities;Therapeutic exercise;Balance training;Patient/family education    PT Goals (Current goals can be found in the Care Plan section)  Acute Rehab PT Goals Patient Stated Goal: Go home PT Goal Formulation: With patient Time For Goal Achievement: 08/08/19 Potential to Achieve Goals: Good    Frequency Min 3X/week   Barriers to discharge        Co-evaluation               AM-PAC PT "6 Clicks" Mobility  Outcome Measure Help needed turning from your back to your side while in a flat bed without using bedrails?: A Little Help needed moving from lying on your back to sitting on the side of a flat bed without using bedrails?: A Little Help needed moving to and from a bed to a chair (including a wheelchair)?: A Little Help needed standing up from a chair using your arms (e.g., wheelchair or bedside chair)?: A Little Help needed to walk in hospital room?: A Lot Help needed climbing 3-5 steps with a railing? : A Lot 6 Click Score: 16    End of Session Equipment Utilized During Treatment: Oxygen Activity Tolerance: Patient limited by fatigue Patient left: in chair;with call bell/phone within reach Nurse Communication: Mobility status;Other (comment)(needs another hot pack for LE and new purewick) PT Visit Diagnosis: Muscle weakness (generalized) (M62.81);Difficulty in walking, not elsewhere classified (R26.2)    Time: 8022-3361 PT Time Calculation (min) (ACUTE ONLY): 31 min   Charges:   PT Evaluation $PT Eval Moderate Complexity: 1 Mod PT Treatments $Therapeutic Activity: 8-22 mins        Windell Norfolk, DPT, PN1   Supplemental Physical Therapist [place]    Pager [phone] Acute Rehab Office [phone]

## 2019-07-25 NOTE — Plan of Care (Signed)
  Problem: Clinical Measurements: Goal: Ability to maintain clinical measurements within normal limits will improve Outcome: Progressing Goal: Diagnostic test results will improve Outcome: Progressing   

## 2019-07-25 NOTE — Progress Notes (Signed)
   Subjective/Chief Complaint: Denies pain. Feeling well this morning  Review of systems otherwise negative   Objective: Vital signs in last 24 hours: Temp:  [97.6 F (36.4 C)-98.2 F (36.8 C)] 98.2 F (36.8 C) (01/28 0738) Pulse Rate:  [68-114] 87 (01/28 0738) Resp:  [15-27] 23 (01/28 0738) BP: (105-135)/(66-96) 126/66 (01/28 0738) SpO2:  [97 %-100 %] 98 % (01/28 0738) FiO2 (%):  [50 %] 50 % (01/27 0833) Weight:  [165.4 kg] 165.4 kg (01/28 0450) Last BM Date: 07/23/19  Intake/Output from previous day: 01/27 0701 - 01/28 0700 In: 444 [P.O.:444] Out: 1150 [Urine:1150] Intake/Output this shift: No intake/output data recorded.  General appearance: alert, cooperative and appears stated age Resp: normal effort GI: soft, nontender, no palpable mass Skin: Skin color, texture, turgor normal. No rashes or lesions Incision/Wound: area of drainage on left hip with punctate opening draining mostly serous fluid, minimal induration no cellulitis or fluctuance, no tenderness or warmth  Lab Results:  Recent Labs    07/24/19 0333  WBC 9.7  HGB 8.8*  HCT 31.8*  PLT 517*   BMET Recent Labs    07/23/19 0618 07/24/19 0333  NA 143 142  K 3.2* 3.4*  CL 101 99  CO2 34* 36*  GLUCOSE 110* 117*  BUN 13 6*  CREATININE 0.53 0.50  CALCIUM 8.6* 8.5*   PT/INR No results for input(s): LABPROT, INR in the last 72 hours. ABG No results for input(s): PHART, HCO3 in the last 72 hours.  Invalid input(s): PCO2, PO2  Studies/Results: [place] LT LOWER EXTREM LTD SOFT TISSUE NON VASCULAR  Result Date: 07/23/2019 CLINICAL DATA:  Left thigh abscess EXAM: ULTRASOUND LEFT LOWER EXTREMITY LIMITED TECHNIQUE: Ultrasound examination of the lower extremity soft tissues was performed in the area of clinical concern. COMPARISON:  None. FINDINGS: Ultrasound was performed in the area of concern in the posterior upper left thigh. There is edema within the subcutaneous soft tissues. Small focal fluid  collection is seen within the subcutaneous soft tissues which measures 2.8 x 2.4 x 0.6 cm and could reflect a small soft tissue abscess. IMPRESSION: Edema within the soft tissues in the area of concern with small focal fluid collection as above concerning for focal abscess. Electronically Signed   By: Rolm Baptise M.D.   On: 07/23/2019 20:44    Anti-infectives: Anti-infectives (From admission, onward)   Start     Dose/Rate Route Frequency Ordered Stop   07/22/19 1500  doxycycline (VIBRA-TABS) tablet 100 mg     100 mg Oral Every 12 hours 07/22/19 1449 07/29/19 0959   07/22/19 1445  cefTRIAXone (ROCEPHIN) 1 g in sodium chloride 0.9 % 100 mL IVPB  Status:  Discontinued     1 g 200 mL/hr over 30 Minutes Intravenous Every 24 hours 07/22/19 1449 07/24/19 1223      Assessment/Plan: Assessment/Plan HTN COPD PE on Eliquis Hypothyroidism Paroxysmal A. fib PNA - Per TRH -   Left Thigh/Gluteal Abscess -seems decompressed and draining more serous appearing fluid now. -Continue warm compresses to the area 4 times daily, continue local care -Doxycycline she is currently on for pneumonia will also cover for this -Surgical team will sign off. Please call with questions.   FEN - HH VTE - SCDs, Eliquis  ID - Doxycycline    LOS: 3 days    Clovis Riley 07/25/2019

## 2019-07-25 NOTE — Progress Notes (Signed)
RT called to room, patient was SOB per the secretary and requested RT. When I arrived, she had already taken her MDI. BBS diminished. Patient stated she had been struggling with chair and got SOB. SAT now 100%on her 6L and she is waiting for transport to take her home. Will monitor again in just a bit for further treatments if needed

## 2019-07-25 NOTE — Care Management (Signed)
1529 07-25-19 [facility] called for transport for patient. Several patient's ahead of her, unsure of estimated time of arrival. No further needs from Case Manager at this time. Bethena Roys, RN,BSN Case Manager [phone]

## 2019-07-26 ENCOUNTER — Other Ambulatory Visit: Payer: Self-pay

## 2019-07-26 LAB — CULTURE, BLOOD (ROUTINE X 2): Culture: NO GROWTH

## 2019-07-29 MED ORDER — APIXABAN 5 MG PO TABS: 5.0000 mg | ORAL_TABLET | Freq: Two times a day (BID) | ORAL | 0 refills | Status: DC

## 2019-07-31 ENCOUNTER — Telehealth (INDEPENDENT_AMBULATORY_CARE_PROVIDER_SITE_OTHER): Payer: Medicare HMO | Admitting: Family Medicine

## 2019-07-31 ENCOUNTER — Other Ambulatory Visit: Payer: Self-pay

## 2019-07-31 DIAGNOSIS — Z9981 Dependence on supplemental oxygen: Secondary | ICD-10-CM | POA: Diagnosis not present

## 2019-07-31 DIAGNOSIS — R5381 Other malaise: Secondary | ICD-10-CM

## 2019-07-31 DIAGNOSIS — I4892 Unspecified atrial flutter: Secondary | ICD-10-CM | POA: Diagnosis not present

## 2019-07-31 DIAGNOSIS — L02416 Cutaneous abscess of left lower limb: Secondary | ICD-10-CM

## 2019-07-31 DIAGNOSIS — E662 Morbid (severe) obesity with alveolar hypoventilation: Secondary | ICD-10-CM | POA: Diagnosis not present

## 2019-07-31 DIAGNOSIS — J449 Chronic obstructive pulmonary disease, unspecified: Secondary | ICD-10-CM | POA: Diagnosis not present

## 2019-07-31 DIAGNOSIS — L0291 Cutaneous abscess, unspecified: Secondary | ICD-10-CM

## 2019-07-31 DIAGNOSIS — G2581 Restless legs syndrome: Secondary | ICD-10-CM | POA: Diagnosis not present

## 2019-07-31 DIAGNOSIS — J441 Chronic obstructive pulmonary disease with (acute) exacerbation: Secondary | ICD-10-CM | POA: Diagnosis not present

## 2019-07-31 DIAGNOSIS — I5033 Acute on chronic diastolic (congestive) heart failure: Secondary | ICD-10-CM | POA: Diagnosis not present

## 2019-07-31 DIAGNOSIS — I480 Paroxysmal atrial fibrillation: Secondary | ICD-10-CM | POA: Diagnosis not present

## 2019-07-31 DIAGNOSIS — J9621 Acute and chronic respiratory failure with hypoxia: Secondary | ICD-10-CM | POA: Diagnosis not present

## 2019-07-31 DIAGNOSIS — Z09 Encounter for follow-up examination after completed treatment for conditions other than malignant neoplasm: Secondary | ICD-10-CM

## 2019-07-31 DIAGNOSIS — M170 Bilateral primary osteoarthritis of knee: Secondary | ICD-10-CM | POA: Diagnosis not present

## 2019-07-31 DIAGNOSIS — I110 Hypertensive heart disease with heart failure: Secondary | ICD-10-CM | POA: Diagnosis not present

## 2019-07-31 MED ORDER — GABAPENTIN 100 MG PO CAPS: 200.0000 mg | ORAL_CAPSULE | Freq: Three times a day (TID) | ORAL | 0 refills | Status: DC

## 2019-08-01 ENCOUNTER — Telehealth: Payer: Self-pay

## 2019-08-01 NOTE — Telephone Encounter (Signed)
[place] with [facility] calls nurse line to request verbal orders for continuation of Home Health PT.  Requests: -once weekly for one week, twice weekly for 1 week, then re-certification.   Would also like to request home health aide services 2 times/ week.   Orders can be left on confidential voicemail at [phone].  Talbot Grumbling, RN

## 2019-08-02 DIAGNOSIS — L0291 Cutaneous abscess, unspecified: Secondary | ICD-10-CM | POA: Insufficient documentation

## 2019-08-02 DIAGNOSIS — R5381 Other malaise: Secondary | ICD-10-CM | POA: Insufficient documentation

## 2019-08-02 NOTE — Assessment & Plan Note (Signed)
Left thigh. Per patient report she is healing well. Completed 7 day course of antibiotics. Can follow up prn

## 2019-08-02 NOTE — Assessment & Plan Note (Signed)
Patient's leg pain is likely muscle aching 2/2 debility. Patient spent 25 days in the hospital from 12/26-1/28 and likely has muscle atrophy. Recommend tylenol and trying gabapentin to help with the pain. nsaids contraindicated due to eliquis use.

## 2019-08-02 NOTE — Progress Notes (Signed)
[place] Telemedicine Visit  Patient consented to have virtual visit. Method of visit: Telephone  Encounter participants: Patient: [name] White - located at home Provider: [name] - located at [facility] Others (if applicable): n/a  Chief Complaint: hospital follow up  HPI: 64 year old female who presents as a telemedicine call for hospital follow up. Patient with chronic respiratory illness requiring oxygen supplementation presented to the hospital on 07/21/2019 for increased shortness of breath. Per her report the oxygen concentrator malfunctioned which resulted in her not getting enough oxygen during continued use of her home o2 and trilogy bipap. She was subsequently treated for a CAP due to xray findings. She was treated with a course of doxy and ctx. She completed a 7 day course of doxy but the ctx was stopped after 2 days.   Also of note she was found to have a 2x2cm abscess on her left thigh, which was drained by surgery. She was similarly treated with a 7 day course of doxy. She feels that the wound looks good, and is healing up well.  Patient is also having a lot of leg pain. She states that it feels very aching and is present in most of her muscle groups. She has been working a lot with home PT/OT.  ROS: per HPI  Pertinent PMHx: acute on chronic hypoxic respiratory failure  Exam:  General: no distress, very pleasant Resp: able to speak in clear, coherent sentences with no wheezing Psych: very pleasant, coherent thought process  Assessment/Plan:  Acute on chronic respiratory failure with hypoxemia (HCC) Doing well after leaving the hospital. On 6l oxygen and will wean as able. Trilogy bipap working properly. Completed course of doxycycline. Wean o2 as able. Continue albuterol prn and incruse.  Debility Patient's leg pain is likely muscle aching 2/2 debility. Patient spent 25 days in the hospital from 12/26-1/28 and likely has muscle atrophy.  Recommend tylenol and trying gabapentin to help with the pain. nsaids contraindicated due to eliquis use.  Abscess Left thigh. Per patient report she is healing well. Completed 7 day course of antibiotics. Can follow up prn    Time spent during visit with patient: 17 minutes

## 2019-08-02 NOTE — Assessment & Plan Note (Signed)
Doing well after leaving the hospital. On 6l oxygen and will wean as able. Trilogy bipap working properly. Completed course of doxycycline. Wean o2 as able. Continue albuterol prn and incruse.

## 2019-08-03 DIAGNOSIS — I5033 Acute on chronic diastolic (congestive) heart failure: Secondary | ICD-10-CM | POA: Diagnosis not present

## 2019-08-03 DIAGNOSIS — M170 Bilateral primary osteoarthritis of knee: Secondary | ICD-10-CM | POA: Diagnosis not present

## 2019-08-03 DIAGNOSIS — J9621 Acute and chronic respiratory failure with hypoxia: Secondary | ICD-10-CM | POA: Diagnosis not present

## 2019-08-03 DIAGNOSIS — I4892 Unspecified atrial flutter: Secondary | ICD-10-CM | POA: Diagnosis not present

## 2019-08-03 DIAGNOSIS — I110 Hypertensive heart disease with heart failure: Secondary | ICD-10-CM | POA: Diagnosis not present

## 2019-08-03 DIAGNOSIS — G2581 Restless legs syndrome: Secondary | ICD-10-CM | POA: Diagnosis not present

## 2019-08-03 DIAGNOSIS — J441 Chronic obstructive pulmonary disease with (acute) exacerbation: Secondary | ICD-10-CM | POA: Diagnosis not present

## 2019-08-03 DIAGNOSIS — I480 Paroxysmal atrial fibrillation: Secondary | ICD-10-CM | POA: Diagnosis not present

## 2019-08-03 DIAGNOSIS — E662 Morbid (severe) obesity with alveolar hypoventilation: Secondary | ICD-10-CM | POA: Diagnosis not present

## 2019-08-04 DIAGNOSIS — J449 Chronic obstructive pulmonary disease, unspecified: Secondary | ICD-10-CM | POA: Diagnosis not present

## 2019-08-04 DIAGNOSIS — N3281 Overactive bladder: Secondary | ICD-10-CM | POA: Diagnosis not present

## 2019-08-05 ENCOUNTER — Telehealth: Payer: Self-pay | Admitting: *Deleted

## 2019-08-05 NOTE — Telephone Encounter (Signed)
Pt calls because she "feels like I am not getting enough oxygen when I am in my bedroom.  I think it is because it has more tubing to travel through"  She is requesting to have the liters increased to 7 or 8.  To PCP to advise. Christen Bame, CMA

## 2019-08-06 DIAGNOSIS — I5033 Acute on chronic diastolic (congestive) heart failure: Secondary | ICD-10-CM | POA: Diagnosis not present

## 2019-08-06 DIAGNOSIS — I110 Hypertensive heart disease with heart failure: Secondary | ICD-10-CM | POA: Diagnosis not present

## 2019-08-06 DIAGNOSIS — E662 Morbid (severe) obesity with alveolar hypoventilation: Secondary | ICD-10-CM | POA: Diagnosis not present

## 2019-08-06 DIAGNOSIS — I4892 Unspecified atrial flutter: Secondary | ICD-10-CM | POA: Diagnosis not present

## 2019-08-06 DIAGNOSIS — J441 Chronic obstructive pulmonary disease with (acute) exacerbation: Secondary | ICD-10-CM | POA: Diagnosis not present

## 2019-08-06 DIAGNOSIS — M170 Bilateral primary osteoarthritis of knee: Secondary | ICD-10-CM | POA: Diagnosis not present

## 2019-08-06 DIAGNOSIS — J9621 Acute and chronic respiratory failure with hypoxia: Secondary | ICD-10-CM | POA: Diagnosis not present

## 2019-08-06 DIAGNOSIS — I480 Paroxysmal atrial fibrillation: Secondary | ICD-10-CM | POA: Diagnosis not present

## 2019-08-06 DIAGNOSIS — G2581 Restless legs syndrome: Secondary | ICD-10-CM | POA: Diagnosis not present

## 2019-08-06 NOTE — Telephone Encounter (Signed)
Jerilyn calling back to check on status of verbal orders for re-certification.  To PCP  Talbot Grumbling, RN

## 2019-08-07 ENCOUNTER — Telehealth: Payer: Self-pay | Admitting: Emergency Medicine

## 2019-08-07 NOTE — Telephone Encounter (Signed)
Patient discharged 07/25/19 checking with front staff to see if patient was scheduled and if not to get patient scheduled this week with NP

## 2019-08-07 NOTE — Telephone Encounter (Signed)
lmam for pt to call and schedule appt

## 2019-08-08 DIAGNOSIS — I480 Paroxysmal atrial fibrillation: Secondary | ICD-10-CM | POA: Diagnosis not present

## 2019-08-08 DIAGNOSIS — I5033 Acute on chronic diastolic (congestive) heart failure: Secondary | ICD-10-CM | POA: Diagnosis not present

## 2019-08-08 DIAGNOSIS — J9621 Acute and chronic respiratory failure with hypoxia: Secondary | ICD-10-CM | POA: Diagnosis not present

## 2019-08-08 DIAGNOSIS — E662 Morbid (severe) obesity with alveolar hypoventilation: Secondary | ICD-10-CM | POA: Diagnosis not present

## 2019-08-08 DIAGNOSIS — G2581 Restless legs syndrome: Secondary | ICD-10-CM | POA: Diagnosis not present

## 2019-08-08 DIAGNOSIS — I4892 Unspecified atrial flutter: Secondary | ICD-10-CM | POA: Diagnosis not present

## 2019-08-08 DIAGNOSIS — J441 Chronic obstructive pulmonary disease with (acute) exacerbation: Secondary | ICD-10-CM | POA: Diagnosis not present

## 2019-08-08 DIAGNOSIS — I110 Hypertensive heart disease with heart failure: Secondary | ICD-10-CM | POA: Diagnosis not present

## 2019-08-08 DIAGNOSIS — M170 Bilateral primary osteoarthritis of knee: Secondary | ICD-10-CM | POA: Diagnosis not present

## 2019-08-09 ENCOUNTER — Inpatient Hospital Stay (HOSPITAL_COMMUNITY)
Admission: EM | Admit: 2019-08-09 | Discharge: 2019-08-12 | DRG: 193 | Disposition: A | Discharge status: Home Health | Payer: Medicare HMO | Attending: Family Medicine | Admitting: Family Medicine

## 2019-08-09 ENCOUNTER — Other Ambulatory Visit: Payer: Self-pay

## 2019-08-09 ENCOUNTER — Encounter (HOSPITAL_COMMUNITY): Payer: Self-pay

## 2019-08-09 ENCOUNTER — Emergency Department (HOSPITAL_COMMUNITY): Payer: Medicare HMO

## 2019-08-09 DIAGNOSIS — E662 Morbid (severe) obesity with alveolar hypoventilation: Secondary | ICD-10-CM | POA: Diagnosis present

## 2019-08-09 DIAGNOSIS — Z20822 Contact with and (suspected) exposure to covid-19: Secondary | ICD-10-CM | POA: Diagnosis present

## 2019-08-09 DIAGNOSIS — G2581 Restless legs syndrome: Secondary | ICD-10-CM | POA: Diagnosis present

## 2019-08-09 DIAGNOSIS — Z6841 Body Mass Index (BMI) 40.0 and over, adult: Secondary | ICD-10-CM | POA: Diagnosis not present

## 2019-08-09 DIAGNOSIS — Z8249 Family history of ischemic heart disease and other diseases of the circulatory system: Secondary | ICD-10-CM

## 2019-08-09 DIAGNOSIS — J440 Chronic obstructive pulmonary disease with acute lower respiratory infection: Secondary | ICD-10-CM | POA: Diagnosis not present

## 2019-08-09 DIAGNOSIS — Z9981 Dependence on supplemental oxygen: Secondary | ICD-10-CM | POA: Diagnosis not present

## 2019-08-09 DIAGNOSIS — Z803 Family history of malignant neoplasm of breast: Secondary | ICD-10-CM | POA: Diagnosis not present

## 2019-08-09 DIAGNOSIS — Z66 Do not resuscitate: Secondary | ICD-10-CM | POA: Diagnosis present

## 2019-08-09 DIAGNOSIS — Z86711 Personal history of pulmonary embolism: Secondary | ICD-10-CM | POA: Diagnosis not present

## 2019-08-09 DIAGNOSIS — Z882 Allergy status to sulfonamides status: Secondary | ICD-10-CM

## 2019-08-09 DIAGNOSIS — J189 Pneumonia, unspecified organism: Principal | ICD-10-CM | POA: Diagnosis present

## 2019-08-09 DIAGNOSIS — I480 Paroxysmal atrial fibrillation: Secondary | ICD-10-CM | POA: Diagnosis present

## 2019-08-09 DIAGNOSIS — J383 Other diseases of vocal cords: Secondary | ICD-10-CM | POA: Diagnosis present

## 2019-08-09 DIAGNOSIS — K219 Gastro-esophageal reflux disease without esophagitis: Secondary | ICD-10-CM | POA: Diagnosis present

## 2019-08-09 DIAGNOSIS — F419 Anxiety disorder, unspecified: Secondary | ICD-10-CM | POA: Diagnosis present

## 2019-08-09 DIAGNOSIS — F329 Major depressive disorder, single episode, unspecified: Secondary | ICD-10-CM | POA: Diagnosis present

## 2019-08-09 DIAGNOSIS — R0602 Shortness of breath: Secondary | ICD-10-CM | POA: Diagnosis not present

## 2019-08-09 DIAGNOSIS — E876 Hypokalemia: Secondary | ICD-10-CM | POA: Diagnosis present

## 2019-08-09 DIAGNOSIS — Z87891 Personal history of nicotine dependence: Secondary | ICD-10-CM

## 2019-08-09 DIAGNOSIS — J9601 Acute respiratory failure with hypoxia: Secondary | ICD-10-CM | POA: Diagnosis present

## 2019-08-09 DIAGNOSIS — R0689 Other abnormalities of breathing: Secondary | ICD-10-CM | POA: Diagnosis not present

## 2019-08-09 DIAGNOSIS — D509 Iron deficiency anemia, unspecified: Secondary | ICD-10-CM | POA: Diagnosis present

## 2019-08-09 DIAGNOSIS — J9622 Acute and chronic respiratory failure with hypercapnia: Secondary | ICD-10-CM | POA: Diagnosis present

## 2019-08-09 DIAGNOSIS — R069 Unspecified abnormalities of breathing: Secondary | ICD-10-CM | POA: Diagnosis not present

## 2019-08-09 DIAGNOSIS — G8929 Other chronic pain: Secondary | ICD-10-CM | POA: Diagnosis present

## 2019-08-09 DIAGNOSIS — R05 Cough: Secondary | ICD-10-CM

## 2019-08-09 DIAGNOSIS — J9612 Chronic respiratory failure with hypercapnia: Secondary | ICD-10-CM | POA: Diagnosis not present

## 2019-08-09 DIAGNOSIS — E1169 Type 2 diabetes mellitus with other specified complication: Secondary | ICD-10-CM | POA: Diagnosis present

## 2019-08-09 DIAGNOSIS — I5032 Chronic diastolic (congestive) heart failure: Secondary | ICD-10-CM | POA: Diagnosis present

## 2019-08-09 DIAGNOSIS — Z881 Allergy status to other antibiotic agents status: Secondary | ICD-10-CM

## 2019-08-09 DIAGNOSIS — Z885 Allergy status to narcotic agent status: Secondary | ICD-10-CM

## 2019-08-09 DIAGNOSIS — Z86718 Personal history of other venous thrombosis and embolism: Secondary | ICD-10-CM

## 2019-08-09 DIAGNOSIS — Z79899 Other long term (current) drug therapy: Secondary | ICD-10-CM

## 2019-08-09 DIAGNOSIS — J9611 Chronic respiratory failure with hypoxia: Secondary | ICD-10-CM | POA: Diagnosis not present

## 2019-08-09 DIAGNOSIS — I152 Hypertension secondary to endocrine disorders: Secondary | ICD-10-CM | POA: Diagnosis present

## 2019-08-09 DIAGNOSIS — Z90710 Acquired absence of both cervix and uterus: Secondary | ICD-10-CM

## 2019-08-09 DIAGNOSIS — M2550 Pain in unspecified joint: Secondary | ICD-10-CM | POA: Diagnosis not present

## 2019-08-09 DIAGNOSIS — R079 Chest pain, unspecified: Secondary | ICD-10-CM | POA: Diagnosis not present

## 2019-08-09 DIAGNOSIS — R5381 Other malaise: Secondary | ICD-10-CM | POA: Diagnosis not present

## 2019-08-09 DIAGNOSIS — Z7951 Long term (current) use of inhaled steroids: Secondary | ICD-10-CM

## 2019-08-09 DIAGNOSIS — J9621 Acute and chronic respiratory failure with hypoxia: Secondary | ICD-10-CM | POA: Diagnosis present

## 2019-08-09 DIAGNOSIS — Z825 Family history of asthma and other chronic lower respiratory diseases: Secondary | ICD-10-CM

## 2019-08-09 DIAGNOSIS — Z7901 Long term (current) use of anticoagulants: Secondary | ICD-10-CM

## 2019-08-09 DIAGNOSIS — R059 Cough, unspecified: Secondary | ICD-10-CM

## 2019-08-09 DIAGNOSIS — I10 Essential (primary) hypertension: Secondary | ICD-10-CM | POA: Diagnosis not present

## 2019-08-09 DIAGNOSIS — Z888 Allergy status to other drugs, medicaments and biological substances status: Secondary | ICD-10-CM

## 2019-08-09 DIAGNOSIS — Z7401 Bed confinement status: Secondary | ICD-10-CM | POA: Diagnosis not present

## 2019-08-09 DIAGNOSIS — R0789 Other chest pain: Secondary | ICD-10-CM | POA: Diagnosis not present

## 2019-08-09 LAB — POCT I-STAT 7, (LYTES, BLD GAS, ICA,H+H)
Acid-Base Excess: 8 mmol/L — ABNORMAL HIGH (ref 0.0–2.0)
Bicarbonate: 34.6 mmol/L — ABNORMAL HIGH (ref 20.0–28.0)
Calcium, Ion: 1.19 mmol/L (ref 1.15–1.40)
HCT: 32 % — ABNORMAL LOW (ref 36.0–46.0)
Hemoglobin: 10.9 g/dL — ABNORMAL LOW (ref 12.0–15.0)
O2 Saturation: 97 %
Patient temperature: 98
Potassium: 2.7 mmol/L — CL (ref 3.5–5.1)
Sodium: 142 mmol/L (ref 135–145)
TCO2: 36 mmol/L — ABNORMAL HIGH (ref 22–32)
pCO2 arterial: 54.7 mmHg — ABNORMAL HIGH (ref 32.0–48.0)
pH, Arterial: 7.407 (ref 7.350–7.450)
pO2, Arterial: 96 mmHg (ref 83.0–108.0)

## 2019-08-09 LAB — COMPREHENSIVE METABOLIC PANEL
ALT: 22 U/L (ref 0–44)
AST: 31 U/L (ref 15–41)
Albumin: 3.1 g/dL — ABNORMAL LOW (ref 3.5–5.0)
Alkaline Phosphatase: 76 U/L (ref 38–126)
Anion gap: 12 (ref 5–15)
BUN: 8 mg/dL (ref 8–23)
CO2: 30 mmol/L (ref 22–32)
Calcium: 8.7 mg/dL — ABNORMAL LOW (ref 8.9–10.3)
Chloride: 100 mmol/L (ref 98–111)
Creatinine, Ser: 0.69 mg/dL (ref 0.44–1.00)
GFR calc Af Amer: >60 mL/min (ref >60)
GFR calc non Af Amer: >60 mL/min (ref >60)
Glucose, Bld: 152 mg/dL — ABNORMAL HIGH (ref 70–99)
Potassium: 3 mmol/L — ABNORMAL LOW (ref 3.5–5.1)
Sodium: 142 mmol/L (ref 135–145)
Total Bilirubin: 0.4 mg/dL (ref 0.3–1.2)
Total Protein: 7.1 g/dL (ref 6.5–8.1)

## 2019-08-09 LAB — PROCALCITONIN: Procalcitonin: <0.1 ng/mL

## 2019-08-09 LAB — LACTIC ACID, PLASMA
Lactic Acid, Venous: 2.6 mmol/L (ref 0.5–1.9)
Lactic Acid, Venous: 3.2 mmol/L (ref 0.5–1.9)

## 2019-08-09 LAB — CBC WITH DIFFERENTIAL/PLATELET
Abs Immature Granulocytes: 0.03 10*3/uL (ref 0.00–0.07)
Basophils Absolute: 0 10*3/uL (ref 0.0–0.1)
Basophils Relative: 0 %
Eosinophils Absolute: 0.5 10*3/uL (ref 0.0–0.5)
Eosinophils Relative: 6 %
HCT: 34.3 % — ABNORMAL LOW (ref 36.0–46.0)
Hemoglobin: 9.6 g/dL — ABNORMAL LOW (ref 12.0–15.0)
Immature Granulocytes: 0 %
Lymphocytes Relative: 27 %
Lymphs Abs: 2.2 10*3/uL (ref 0.7–4.0)
MCH: 24.3 pg — ABNORMAL LOW (ref 26.0–34.0)
MCHC: 28 g/dL — ABNORMAL LOW (ref 30.0–36.0)
MCV: 86.8 fL (ref 80.0–100.0)
Monocytes Absolute: 0.6 10*3/uL (ref 0.1–1.0)
Monocytes Relative: 7 %
Neutro Abs: 4.8 10*3/uL (ref 1.7–7.7)
Neutrophils Relative %: 60 %
Platelets: 688 10*3/uL — ABNORMAL HIGH (ref 150–400)
RBC: 3.95 MIL/uL (ref 3.87–5.11)
RDW: 19.7 % — ABNORMAL HIGH (ref 11.5–15.5)
WBC: 8.1 10*3/uL (ref 4.0–10.5)
nRBC: 0 % (ref 0.0–0.2)

## 2019-08-09 LAB — BASIC METABOLIC PANEL
Anion gap: 12 (ref 5–15)
BUN: 10 mg/dL (ref 8–23)
CO2: 28 mmol/L (ref 22–32)
Calcium: 8.6 mg/dL — ABNORMAL LOW (ref 8.9–10.3)
Chloride: 101 mmol/L (ref 98–111)
Creatinine, Ser: 0.63 mg/dL (ref 0.44–1.00)
GFR calc Af Amer: >60 mL/min (ref >60)
GFR calc non Af Amer: >60 mL/min (ref >60)
Glucose, Bld: 214 mg/dL — ABNORMAL HIGH (ref 70–99)
Potassium: 3.8 mmol/L (ref 3.5–5.1)
Sodium: 141 mmol/L (ref 135–145)

## 2019-08-09 LAB — RESPIRATORY PANEL BY RT PCR (FLU A&B, COVID)
Influenza A by PCR: NEGATIVE
Influenza B by PCR: NEGATIVE
SARS Coronavirus 2 by RT PCR: NEGATIVE

## 2019-08-09 LAB — GLUCOSE, CAPILLARY
Glucose-Capillary: 215 mg/dL — ABNORMAL HIGH (ref 70–99)
Glucose-Capillary: 231 mg/dL — ABNORMAL HIGH (ref 70–99)

## 2019-08-09 LAB — URINALYSIS, ROUTINE W REFLEX MICROSCOPIC
Bilirubin Urine: NEGATIVE
Glucose, UA: NEGATIVE mg/dL
Hgb urine dipstick: NEGATIVE
Ketones, ur: NEGATIVE mg/dL
Leukocytes,Ua: NEGATIVE
Nitrite: NEGATIVE
Protein, ur: NEGATIVE mg/dL
Specific Gravity, Urine: 1.018 (ref 1.005–1.030)
pH: 7 (ref 5.0–8.0)

## 2019-08-09 LAB — MRSA PCR SCREENING: MRSA by PCR: NEGATIVE

## 2019-08-09 LAB — BRAIN NATRIURETIC PEPTIDE: B Natriuretic Peptide: 40.1 pg/mL (ref 0.0–100.0)

## 2019-08-09 LAB — PROTIME-INR
INR: 1 (ref 0.8–1.2)
Prothrombin Time: 13.4 seconds (ref 11.4–15.2)

## 2019-08-09 LAB — CBG MONITORING, ED: Glucose-Capillary: 209 mg/dL — ABNORMAL HIGH (ref 70–99)

## 2019-08-09 LAB — APTT: aPTT: 27 seconds (ref 24–36)

## 2019-08-09 LAB — TROPONIN I (HIGH SENSITIVITY): Troponin I (High Sensitivity): 8 ng/L (ref <18)

## 2019-08-09 MED ORDER — KETOTIFEN FUMARATE 0.025 % OP SOLN
1.0000 [drp] | Freq: Two times a day (BID) | OPHTHALMIC | Status: DC
  Administered 2019-08-09 – 2019-08-12 (×6): 1 [drp] via OPHTHALMIC
  Filled 2019-08-09: qty 5

## 2019-08-09 MED ORDER — ALBUTEROL SULFATE HFA 108 (90 BASE) MCG/ACT IN AERS
2.0000 | INHALATION_SPRAY | Freq: Once | RESPIRATORY_TRACT | Status: DC
  Filled 2019-08-09: qty 6.7

## 2019-08-09 MED ORDER — AZELASTINE HCL 0.1 % NA SOLN
2.0000 | Freq: Every day | NASAL | Status: DC
  Administered 2019-08-10 – 2019-08-12 (×3): 2 via NASAL
  Filled 2019-08-09: qty 30

## 2019-08-09 MED ORDER — ACETAMINOPHEN 325 MG PO TABS
650.0000 mg | ORAL_TABLET | Freq: Four times a day (QID) | ORAL | Status: DC | PRN
  Administered 2019-08-10 – 2019-08-12 (×2): 650 mg via ORAL
  Filled 2019-08-09 (×2): qty 2

## 2019-08-09 MED ORDER — TRAMADOL HCL 50 MG PO TABS: 50.0000 mg | ORAL_TABLET | Freq: Two times a day (BID) | ORAL | Status: DC | PRN

## 2019-08-09 MED ORDER — FLUTICASONE PROPIONATE 50 MCG/ACT NA SUSP
2.0000 | Freq: Every day | NASAL | Status: DC
  Administered 2019-08-10 – 2019-08-12 (×3): 2 via NASAL
  Filled 2019-08-09: qty 16

## 2019-08-09 MED ORDER — POTASSIUM CHLORIDE 10 MEQ/100ML IV SOLN
10.0000 meq | INTRAVENOUS | Status: DC
  Administered 2019-08-09 (×2): 10 meq via INTRAVENOUS
  Filled 2019-08-09 (×2): qty 100

## 2019-08-09 MED ORDER — POTASSIUM CHLORIDE CRYS ER 20 MEQ PO TBCR: 40.0000 meq | EXTENDED_RELEASE_TABLET | Freq: Two times a day (BID) | ORAL | Status: DC

## 2019-08-09 MED ORDER — DEXTROMETHORPHAN POLISTIREX ER 30 MG/5ML PO SUER
30.0000 mg | Freq: Two times a day (BID) | ORAL | Status: DC | PRN
  Administered 2019-08-10 – 2019-08-11 (×4): 30 mg via ORAL
  Filled 2019-08-09 (×5): qty 5

## 2019-08-09 MED ORDER — FLECAINIDE ACETATE 100 MG PO TABS
100.0000 mg | ORAL_TABLET | Freq: Two times a day (BID) | ORAL | Status: DC
  Administered 2019-08-09 – 2019-08-12 (×6): 100 mg via ORAL
  Filled 2019-08-09 (×7): qty 1

## 2019-08-09 MED ORDER — GABAPENTIN 100 MG PO CAPS
200.0000 mg | ORAL_CAPSULE | Freq: Three times a day (TID) | ORAL | Status: DC
  Administered 2019-08-09 – 2019-08-12 (×10): 200 mg via ORAL
  Filled 2019-08-09 (×10): qty 2

## 2019-08-09 MED ORDER — MAGNESIUM SULFATE 2 GM/50ML IV SOLN
2.0000 g | INTRAVENOUS | Status: AC
  Administered 2019-08-09: 2 g via INTRAVENOUS
  Filled 2019-08-09: qty 50

## 2019-08-09 MED ORDER — PANTOPRAZOLE SODIUM 40 MG PO TBEC
40.0000 mg | DELAYED_RELEASE_TABLET | Freq: Every day | ORAL | Status: DC
  Administered 2019-08-10 – 2019-08-12 (×3): 40 mg via ORAL
  Filled 2019-08-09 (×3): qty 1

## 2019-08-09 MED ORDER — UMECLIDINIUM BROMIDE 62.5 MCG/INH IN AEPB
1.0000 | INHALATION_SPRAY | Freq: Every day | RESPIRATORY_TRACT | Status: DC
  Administered 2019-08-10 – 2019-08-12 (×3): 1 via RESPIRATORY_TRACT
  Filled 2019-08-09: qty 7

## 2019-08-09 MED ORDER — FAMOTIDINE 20 MG PO TABS
20.0000 mg | ORAL_TABLET | Freq: Every day | ORAL | Status: DC
  Administered 2019-08-10 – 2019-08-12 (×3): 20 mg via ORAL
  Filled 2019-08-09 (×3): qty 1

## 2019-08-09 MED ORDER — TOPIRAMATE 25 MG PO TABS
50.0000 mg | ORAL_TABLET | Freq: Every day | ORAL | Status: DC
  Administered 2019-08-10 – 2019-08-12 (×3): 50 mg via ORAL
  Filled 2019-08-09 (×4): qty 2

## 2019-08-09 MED ORDER — LORATADINE 10 MG PO TABS
10.0000 mg | ORAL_TABLET | Freq: Every day | ORAL | Status: DC
  Administered 2019-08-10 – 2019-08-12 (×3): 10 mg via ORAL
  Filled 2019-08-09 (×3): qty 1

## 2019-08-09 MED ORDER — POLYETHYLENE GLYCOL 3350 17 G PO PACK: 17.0000 g | PACK | Freq: Every day | ORAL | Status: DC | PRN

## 2019-08-09 MED ORDER — DILTIAZEM HCL ER COATED BEADS 120 MG PO CP24
120.0000 mg | ORAL_CAPSULE | Freq: Every day | ORAL | Status: DC
  Administered 2019-08-09 – 2019-08-11 (×3): 120 mg via ORAL
  Filled 2019-08-09 (×4): qty 1

## 2019-08-09 MED ORDER — HYDROXYZINE HCL 25 MG PO TABS
25.0000 mg | ORAL_TABLET | Freq: Two times a day (BID) | ORAL | Status: DC | PRN
  Administered 2019-08-10 – 2019-08-11 (×3): 25 mg via ORAL
  Filled 2019-08-09 (×4): qty 1

## 2019-08-09 MED ORDER — VENLAFAXINE HCL ER 150 MG PO CP24
150.0000 mg | ORAL_CAPSULE | Freq: Every day | ORAL | Status: DC
  Administered 2019-08-10 – 2019-08-12 (×3): 150 mg via ORAL
  Filled 2019-08-09: qty 2
  Filled 2019-08-09: qty 1
  Filled 2019-08-09: qty 2
  Filled 2019-08-09 (×2): qty 1
  Filled 2019-08-09: qty 2
  Filled 2019-08-09: qty 1

## 2019-08-09 MED ORDER — METOPROLOL TARTRATE 12.5 MG HALF TABLET
12.5000 mg | ORAL_TABLET | Freq: Two times a day (BID) | ORAL | Status: DC
  Administered 2019-08-09 – 2019-08-12 (×6): 12.5 mg via ORAL
  Filled 2019-08-09 (×6): qty 1

## 2019-08-09 MED ORDER — CYCLOSPORINE 0.05 % OP EMUL
1.0000 [drp] | Freq: Two times a day (BID) | OPHTHALMIC | Status: DC
  Administered 2019-08-09 – 2019-08-12 (×6): 1 [drp] via OPHTHALMIC
  Filled 2019-08-09 (×8): qty 1

## 2019-08-09 MED ORDER — METHYLPREDNISOLONE SODIUM SUCC 125 MG IJ SOLR
125.0000 mg | Freq: Once | INTRAMUSCULAR | Status: AC
  Administered 2019-08-09: 125 mg via INTRAVENOUS
  Filled 2019-08-09: qty 2

## 2019-08-09 MED ORDER — ATORVASTATIN CALCIUM 40 MG PO TABS
40.0000 mg | ORAL_TABLET | Freq: Every day | ORAL | Status: DC
  Administered 2019-08-09 – 2019-08-12 (×4): 40 mg via ORAL
  Filled 2019-08-09 (×5): qty 1

## 2019-08-09 MED ORDER — APIXABAN 5 MG PO TABS
5.0000 mg | ORAL_TABLET | Freq: Two times a day (BID) | ORAL | Status: DC
  Administered 2019-08-09 – 2019-08-12 (×6): 5 mg via ORAL
  Filled 2019-08-09 (×7): qty 1

## 2019-08-09 MED ORDER — POTASSIUM CHLORIDE CRYS ER 20 MEQ PO TBCR
40.0000 meq | EXTENDED_RELEASE_TABLET | Freq: Two times a day (BID) | ORAL | Status: AC
  Administered 2019-08-09 – 2019-08-10 (×2): 40 meq via ORAL
  Filled 2019-08-09 (×3): qty 2

## 2019-08-09 MED ORDER — POTASSIUM CHLORIDE 10 MEQ/100ML IV SOLN
10.0000 meq | Freq: Once | INTRAVENOUS | Status: AC
  Administered 2019-08-09: 10 meq via INTRAVENOUS
  Filled 2019-08-09: qty 100

## 2019-08-09 MED ORDER — IPRATROPIUM-ALBUTEROL 0.5-2.5 (3) MG/3ML IN SOLN
3.0000 mL | Freq: Two times a day (BID) | RESPIRATORY_TRACT | Status: DC | PRN
  Administered 2019-08-09: 3 mL via RESPIRATORY_TRACT
  Filled 2019-08-09: qty 3

## 2019-08-09 MED ORDER — VENLAFAXINE HCL 37.5 MG PO TABS
37.5000 mg | ORAL_TABLET | Freq: Every morning | ORAL | Status: DC
  Administered 2019-08-10 – 2019-08-12 (×2): 37.5 mg via ORAL
  Filled 2019-08-09 (×4): qty 1

## 2019-08-09 MED ORDER — DICLOFENAC SODIUM 1 % TD GEL: 2.0000 g | Freq: Four times a day (QID) | TRANSDERMAL | Status: DC | PRN

## 2019-08-09 MED ORDER — INSULIN ASPART 100 UNIT/ML ~~LOC~~ SOLN
0.0000 [IU] | Freq: Three times a day (TID) | SUBCUTANEOUS | Status: DC
  Administered 2019-08-09: 3 [IU] via SUBCUTANEOUS
  Administered 2019-08-10: 1 [IU] via SUBCUTANEOUS

## 2019-08-09 MED ORDER — ALBUTEROL (5 MG/ML) CONTINUOUS INHALATION SOLN
10.0000 mg/h | INHALATION_SOLUTION | RESPIRATORY_TRACT | Status: AC
  Administered 2019-08-09: 10 mg/h via RESPIRATORY_TRACT
  Filled 2019-08-09: qty 20

## 2019-08-09 MED ORDER — MOMETASONE FURO-FORMOTEROL FUM 100-5 MCG/ACT IN AERO
2.0000 | INHALATION_SPRAY | Freq: Two times a day (BID) | RESPIRATORY_TRACT | Status: DC
  Administered 2019-08-09 – 2019-08-12 (×7): 2 via RESPIRATORY_TRACT
  Filled 2019-08-09: qty 8.8

## 2019-08-09 MED ORDER — MELATONIN 3 MG PO TABS
3.0000 mg | ORAL_TABLET | Freq: Every evening | ORAL | Status: DC | PRN
  Filled 2019-08-09: qty 1

## 2019-08-09 MED ORDER — MONTELUKAST SODIUM 10 MG PO TABS
10.0000 mg | ORAL_TABLET | Freq: Every day | ORAL | Status: DC
  Administered 2019-08-09 – 2019-08-11 (×3): 10 mg via ORAL
  Filled 2019-08-09 (×4): qty 1

## 2019-08-09 MED ORDER — FUROSEMIDE 80 MG PO TABS
80.0000 mg | ORAL_TABLET | Freq: Every day | ORAL | Status: DC
  Administered 2019-08-10 – 2019-08-12 (×3): 80 mg via ORAL
  Filled 2019-08-09 (×3): qty 1

## 2019-08-09 MED ORDER — AEROCHAMBER PLUS FLO-VU LARGE MISC: 1.0000 | Freq: Once | Status: DC

## 2019-08-09 NOTE — Progress Notes (Signed)
RN notified of critical K on ABG results.

## 2019-08-09 NOTE — Telephone Encounter (Signed)
Patient is currently admitted in the hospital. Can discuss at future follow up.  [name] MD PGY-3 Family Medicine Resident

## 2019-08-09 NOTE — Progress Notes (Signed)
Went by to place patient on Bipap, which is already set up in room, patient stated that she was wet and desired to be changed first, would not allow me to place her on Bipap.  Will go back and check in 15 minutes to see if she is ready then.

## 2019-08-09 NOTE — ED Triage Notes (Signed)
Pt from home with ems for resp distress. Pt on 7L  when EMS arrived with sats at 85%. CPAP placed en route, when pt arrived to ED she was on 15L NRB with sats at 100%. Pt a.o, been SOB for 3 days, hx of COPD and CHF. BiPAP placed upon arrival to ED

## 2019-08-09 NOTE — Telephone Encounter (Signed)
Gave verbal orders  [name] MD PGY-3 Family Medicine Resident

## 2019-08-09 NOTE — H&P (Addendum)
[facility] Admission History and Physical Service Pager: [phone]  Patient name: [name] White Medical record number: [id number] Date of birth: Sep 03, 1955 Age: 64 y.o. Gender: female  Primary Care Provider: [name], MD Consultants: None Code Status: DNR Preferred Emergency Contact: niece.   Chief Complaint: Shortness of breath  Assessment and Plan: [name] White is a 64 y.o. female presenting with shortness of breath. PMH is significant for vocal cord dysfunction status post partial thyroidectomy, COPD/asthma, hypertension, history of PE, A. fib on Eliquis, anxiety, depression, chronic back pain, GERD, falls.  Acute hypoxic respiratory failure Patient with a history of prior admissions due to acute proximal respiratory failure.  Normally uses BiPAP at night and has a daily oxygen requirement of 4-5 L.  recent admission at the end of January due to a faulty oxygen concentrator. No issues w/ equipment since that time, however about 3 days ago she started endorsing increasing cough and shortness of breath prompting her to come into the emergency department.  In the emergency department ABG was mild with slight elevation in PCO2 (54.7). Pt afebrile with normal wbc, PCT < 0.10, and negative for flu/covid. CXR showed possible early PNA in L lung base, however this appears consistent with previous CXRs. hgb was 9-10, which is consistent with many previous hemoglobin values. Therefore unlikely d/t bacterial respiratory illness.  Patient vitals in the ED showed her tachypneic and tachycardic though afebrile while on BiPAP.  Etiology for patient's acute hypoxic respiratory failure can include viral lower respiratory infection, equipment malfunction or noncompliance, pulmonary embolism. Less likely ACS or heart failure exacerbation d/t bnp < 100 and unchanged ekg. troponins pending. Patient does have a Geneva score of 12 which puts her at high risk of PE, however  during last admission (1/24) patient received a CTA for similar symptoms that was negative and is anticoagulated on Eliquis for history of DVTs, making this less likely.   -Admit to progressive, attending Dr. Owens Shark -Continue BiPAP as needed -N.p.o. while on BiPAP -Follow-up on blood culture -Daily weights -Strict I's and O's -Low threshold for CTA -Up with assistance -PT/OT eval and treat -Continuous cardiac monitoring -Continuous pulse ox -Strict I's and O's -Daily weights -follow-up on troponin  Hypokalemia Patient with a history of hypokalemia with home potassium dose of 88meq twice daily.  Initial potassium of 3.0, 2.7 on recheck.  Patient received 23meq IV potassium due to n.p.o. status on BiPAP.  Will initiate 4 runs of 61meq IV potassium and recheck. -A.m. BMP -Replete as appropriate.  HFpEF Current BNP 40.1.  Most recent ECHO 12/02, LVEF 60-65%, normal LV function, severe increased LV hypertrophy.  Home medication Lasix 80 mg daily. Chest xray w/o pulmonary edema.  On exam has trace pitting edema in the left leg.  If elevated BNP, consider IV lasix. Overall, does not appear to be in heart failure.  -Continue home medications once not n.p.o. on BiPAP -daily weights -strict intake and output  Chest pressure  Patient with history of chronic chest pressure with complaint of chest pressure during this admission.  During previous admissions cardiac work-up was performed and thought to be related to anxiety and respiratory stress as well as GERD. -We will check high-sensitivity troponin  OSA/OHS Patient with trilogy BiPAP at home. -BiPAP as needed -BiPAP at night -Consider sleep study outpatient  Diabetes Patient with recent diagnosis of diabetes during admission this past December.  HbA1C at that time of 7.8.  Glucose on admission 152.  During previous hospitalization  it was thought that patient's 7.8 A1c was related to her numerous steroid treatments and she was not  discharged home on any diabetic medications. -Sensitive sliding scale -monitor CBG - A1c on 3/19 or later  History of pulmonary embolism Patient with a history of pulmonary embolism with chronic swelling of the left lower limb.  Home medication Eliquis. Geneva score 12.  Recent CTA performed on 1/24 which was negative for pulmonary embolism. Presentation this admission looks similar to previous admissions in which pt was worked up and negative for PE.  -Continue Eliquis 5mg  BID -Low threshold to initiate CTA  Paroxysmal AFIB Home medication Digoxin 120 mg qhs, Metoprolol 12.5 mg BID, Flecanide 100 mg BID, Eliquis.  Per prior notes patient may have a propensity to occasionally flip into A. fib which could increase her chest pressure/anxiety sensations. -Continue home medication once not n.p.o. on BiPAP   History of HTN Blood pressure on admission of 99/53, improved to 106/74. Home medication, Metopriolol, Cardizem, Lasix -Continue home medication once not n.p.o. on BiPAP  Morbid Obesity Current weight of 165 kg, BMI 60.53.  This weight seems to be approximately the same as her last hospitalization with weights at that time from 164-169 kg. -encourage healthy lifestyle and weight management  ?COPD/Asthma vs. Vocal Cord dysfunction Recent PFT's show no evidence of COPD/Asthma. Possibly vocal cord dysfunction, on home oxygen of 6L.  Followed by Dr.Byrum, Pulmonology.  Missed last appointment.  Home medications Montelukast, Albuterol, Duo-nebs, Dulera. Was followed by Pulmonology, but medications were not discontinued. Recommend avoiding systemic corticosteroids as this may worsen vocal cord dysfunction. -continue home medications  -monitor resp status   MDD/Anxiety Patient with a history of anxiety. Her home medications include Atarax 25 mg, Effexor 187.5 mg daily  -Continue home medications  GERD Home meds Prilosec 20 mg daily and Pepcid 20 mg.  -continue home meds   FEN/GI:  N.p.o. while on BiPAP Prophylaxis: Eliquis  Disposition: Admit to progressive  History of Present Illness:  [name] White is a 64 y.o. female presenting with shortness of breath and worsening cough.  Patient states after her last discharge with her new oxygen concentrator she was doing pretty well for a period of time, however unfortunately about 3 days ago she started developing worsening respiratory symptoms with increasing cough. She denies fever/chills.  She states she had trouble breathing when she was not on her trilogy BiPAP machine during the day.  She was using it at night but during the day when she came off of it she had increased difficulty breathing.  She states prior to this time since the last discharge she was doing well with it during the daytime.  She reports that she is compliant with her trilogy machine and that is functioning properly.  Normally uses BiPAP at night and on home oxygen of 4-5 L during the day.  Denies tobacco/alcohol/drug use.   Review Of Systems: Per HPI with the following additions:   Review of Systems  Constitutional: Negative for chills and fever.  HENT: Positive for congestion. Negative for sore throat.   Respiratory: Positive for cough and shortness of breath.   Cardiovascular: Positive for chest pain and leg swelling.  Gastrointestinal: Positive for nausea. Negative for abdominal pain, diarrhea and vomiting.  Genitourinary: Negative for dysuria and urgency.  Neurological: Positive for headaches. Negative for dizziness.    Patient Active Problem List   Diagnosis Date Noted  . Debility 08/02/2019  . Abscess 08/02/2019  . Acute hypoxemic respiratory failure ([place])  07/21/2019  . Hypertension associated with diabetes ([place])   . Fever   . COPD (chronic obstructive pulmonary disease) ([place]) 06/21/2019  . Community acquired pneumonia 06/14/2019  . Weight gain due to medication 06/05/2019  . Acute respiratory failure with hypoxia ([place]) 05/28/2019   . Bronchitis with acute wheezing 05/09/2019  . Hyperglycemia 03/19/2019  . Acute on chronic respiratory failure with hypoxemia ([place]) 03/14/2019  . Acute on chronic heart failure with preserved ejection fraction ([place]) 03/14/2019  . Acquired thrombophilia ([place]) due to A-Fib 03/07/2019  . Heart failure with preserved ejection fraction ([place]) 03/06/2019  . Obesity hypoventilation syndrome ([place]) 03/06/2019  . Lower GI bleed   . Urge incontinence 01/27/2019  . COPD exacerbation ([place]) 08/29/2018  . COPD with acute exacerbation ([place]) 08/07/2018  . OSA (obstructive sleep apnea) 07/05/2018  . Respiratory failure ([place]) 07/05/2018  . Severe episode of recurrent major depressive disorder, without psychotic features ([place])   . MDD (major depressive disorder), recurrent episode, severe ([place]) 09/07/2015  . Atrial flutter ([place]) 07/29/2015  . Asthma 11/12/2014  . History of hypertension 11/05/2014  . Paroxysmal atrial fibrillation ([place]) 11/03/2014  . Chronic anticoagulation 11/03/2014  . Major depressive disorder 11/03/2014  . History of pulmonary embolus (PE) 11/03/2014  . MDD (major depressive disorder), recurrent severe, without psychosis ([place]) 03/05/2014  . Vocal cord dysfunction 11/12/2013  . Nonintractable headache 01/06/2012  . Hot flashes 04/22/2011  . OVERACTIVE BLADDER 04/18/2008  . Osteoarthritis of both knees 04/18/2008  . Osteoarthritis involving multiple joints on both sides of body 09/14/2007  . Morbid obesity ([place]) 08/24/2006  . RESTLESS LEGS SYNDROME 08/24/2006  . HYPERTENSION, BENIGN SYSTEMIC 08/24/2006  . RHINITIS, ALLERGIC 08/24/2006  . GASTROESOPHAGEAL REFLUX, NO ESOPHAGITIS 08/24/2006    Past Medical History: Past Medical History:  Diagnosis Date  . Abdominal wall hematoma 03/06/2019  . Acute bronchitis 08/29/2018  . Acute on chronic respiratory failure with hypoxia ([place]) 08/29/2018  . Anxiety   . Asthma   . Chronic lower back pain   . COPD (chronic obstructive pulmonary  disease) ([place])   . Depression   . Exposure to COVID-19 virus 11/02/2018  . Fall 02/2019  . Family history of anesthesia complication    "daughter; causes her to pass out afterwards"  . GERD (gastroesophageal reflux disease)   . History of atrial flutter 06/26/2015  . History of pulmonary embolus (PE) 11/03/2014  . Hypertension   . Hyperthyroidism   . Left medial tibial stress syndrome 12/26/2017  . Lower GI bleed   . Migraine    "monthly" (12/28/2013)  . Non-traumatic rhabdomyolysis 11/03/2014  . Obesity hypoventilation syndrome ([place]) 03/06/2019  . Osteoarthritis    "both knees; back of my neck; right pelvic bone" (12/28/2013)  . Paroxysmal A-fib ([place])   . Pulmonary embolism ([place]) 12/28/2013   "2 clots in each lung"    Past Surgical History: Past Surgical History:  Procedure Laterality Date  . ABDOMINAL HYSTERECTOMY    . APPENDECTOMY    . BREAST CYST EXCISION Right   . DILATION AND CURETTAGE OF UTERUS    . ELECTROPHYSIOLOGIC STUDY N/A 05/27/2015   Procedure: A-Flutter;  Surgeon: Evans Lance, MD;  Location: [facility];  Service: Cardiovascular;  Laterality: N/A;  . EXCISIONAL HEMORRHOIDECTOMY    . NASAL SINUS SURGERY  2007  . THYROIDECTOMY, PARTIAL Right 2005  . TUBAL LIGATION    . WISDOM TOOTH EXTRACTION      Social History: Social History   Tobacco Use  . Smoking status: Former  Smoker    Packs/day: 0.25    Years: 20.00    Pack years: 5.00    Types: Cigarettes    Quit date: 07/17/1999    Years since quitting: 20.0  . Smokeless tobacco: Never Used  Substance Use Topics  . Alcohol use: Not Currently    Comment: "drank some in my 30's"  . Drug use: No   Additional social history: None Please also refer to relevant sections of EMR.  Family History: Family History  Problem Relation Age of Onset  . Osteoarthritis Mother   . Asthma Mother   . Heart failure Mother   . Breast cancer Daughter     Allergies and Medications: Allergies  Allergen Reactions  .  Caffeine Other (See Comments)    Migraine  . Hydralazine Hcl     Other reaction(s): Other (See Comments) AKI leading to rhabdo and electrolyte abnormalities  . Hydrocodone Nausea And Vomiting    Headache  . Ciprofloxacin Hives and Rash  . Erythromycin Hives and Rash  . Lisinopril Cough  . Oxycodone Nausea And Vomiting    Headache  . Sulfamethoxazole-Trimethoprim Rash   No current facility-administered medications on file prior to encounter.   Current Outpatient Medications on File Prior to Encounter  Medication Sig Dispense Refill  . acetaminophen (TYLENOL) 325 MG tablet Take 2 tablets (650 mg total) by mouth every 6 (six) hours as needed for mild pain.    Marland Kitchen albuterol (PROVENTIL) (2.5 MG/3ML) 0.083% nebulizer solution Take 3 mLs (2.5 mg total) by nebulization every 6 (six) hours as needed for wheezing or shortness of breath. (Patient not taking: Reported on 06/15/2019) 75 mL 4  . apixaban (ELIQUIS) 5 MG TABS tablet Take 1 tablet (5 mg total) by mouth 2 (two) times daily. 60 tablet 0  . atorvastatin (LIPITOR) 40 MG tablet Take 1 tablet (40 mg total) by mouth daily at 6 PM. 30 tablet 3  . azelastine (ASTELIN) 0.1 % nasal spray Place 2 sprays into both nostrils 2 (two) times daily. Use in each nostril as directed (Patient taking differently: Place 2 sprays into both nostrils daily. ) 30 mL 0  . azelastine (OPTIVAR) 0.05 % ophthalmic solution Place 1 drop into both eyes 2 (two) times daily as needed (itchy eyes).    . cycloSPORINE (RESTASIS) 0.05 % ophthalmic emulsion Place 1 drop into both eyes 2 (two) times daily.    . diclofenac sodium (VOLTAREN) 1 % GEL APPLY 2 GRAMS EXTERNALLY TO THE AFFECTED AREA FOUR TIMES DAILY (Patient taking differently: Apply 2 g topically 4 (four) times daily as needed (arthritis pain - knees, shoulder and thighs). ) 100 g 0  . diltiazem (CARDIZEM CD) 120 MG 24 hr capsule Take 1 capsule (120 mg total) by mouth daily. (Patient taking differently: Take 120 mg by mouth  at bedtime. ) 90 capsule 0  . famotidine (PEPCID) 20 MG tablet Take 1 tablet (20 mg total) by mouth daily. 90 tablet 0  . flecainide (TAMBOCOR) 100 MG tablet TAKE 1 TABLET BY MOUTH 2 TIMES A DAY. PLEASE KEEP UPCOMING APPOINTMENT IN JANUARY BEFORE ANYMORE REFILLS. (Patient taking differently: Take 100 mg by mouth 2 (two) times daily. ) 60 tablet 6  . fluticasone (FLONASE) 50 MCG/ACT nasal spray Place 2 sprays into both nostrils daily.     . furosemide (LASIX) 80 MG tablet Take 1 tablet (80 mg total) by mouth daily. 30 tablet 0  . gabapentin (NEURONTIN) 100 MG capsule Take 2 capsules (200 mg total) by mouth 3 (  three) times daily. 90 capsule 0  . guaiFENesin-dextromethorphan (ROBITUSSIN DM) 100-10 MG/5ML syrup Take 5 mLs by mouth every 4 (four) hours as needed for cough. (Patient not taking: Reported on 07/21/2019) 118 mL 0  . hydrOXYzine (VISTARIL) 25 MG capsule Take 1 capsule (25 mg total) by mouth 2 (two) times daily as needed for anxiety. 30 capsule 0  . ipratropium-albuterol (DUONEB) 0.5-2.5 (3) MG/3ML SOLN Take 3 mLs by nebulization every 12 (twelve) hours as needed (shortness of breath, wheezing). 360 mL 3  . loratadine (CLARITIN) 10 MG tablet TAKE 1 TABLET(10 MG) BY MOUTH DAILY (Patient taking differently: Take 10 mg by mouth daily. ) 90 tablet 0  . Melatonin 3 MG TABS Take 1 tablet (3 mg total) by mouth at bedtime as needed (sleep). 30 tablet 0  . metoprolol tartrate (LOPRESSOR) 25 MG tablet Take 0.5 tablets (12.5 mg total) by mouth 2 (two) times daily. 60 tablet 0  . mometasone-formoterol (DULERA) 100-5 MCG/ACT AERO Inhale 2 puffs into the lungs 2 (two) times daily. 13 g 0  . montelukast (SINGULAIR) 10 MG tablet Take 1 tablet (10 mg total) by mouth at bedtime. 30 tablet 0  . omeprazole (PRILOSEC) 20 MG capsule Take 1 capsule (20 mg total) by mouth daily. 30 capsule 0  . OXYGEN Inhale 3 L into the lungs continuous.     . polyethylene glycol (MIRALAX / GLYCOLAX) 17 g packet Take 17 g by mouth  daily as needed for mild constipation. 14 each 0  . potassium chloride SA (KLOR-CON) 20 MEQ tablet Take 2 tablets (40 mEq total) by mouth 2 (two) times daily. 120 tablet 0  . topiramate (TOPAMAX) 50 MG tablet Take 1 tablet (50 mg total) by mouth daily. 30 tablet 3  . traMADol (ULTRAM) 50 MG tablet Take 1 tablet (50 mg total) by mouth every 12 (twelve) hours as needed (breakthrough pain. Must have tried Tylenol first.). 30 tablet 0  . umeclidinium bromide (INCRUSE ELLIPTA) 62.5 MCG/INH AEPB Inhale 1 puff into the lungs daily. 30 each 0  . venlafaxine (EFFEXOR) 37.5 MG tablet Take 37.5 mg by mouth daily with breakfast. Take with a 150 mg ER capsule every morning    . venlafaxine XR (EFFEXOR-XR) 150 MG 24 hr capsule Take 1 capsule (150 mg total) by mouth daily with breakfast. Take 150mg  capsule with 37.5mg  tablet (Patient taking differently: Take 150 mg by mouth daily with breakfast. Take with a 37.5 mg tablet every morning)    . VENTOLIN HFA 108 (90 Base) MCG/ACT inhaler INHALE 2 PUFFS INTO THE LUNGS EVERY 6 HOURS AS NEEDED FOR WHEEZING OR SHORTNESS OF BREATH (Patient taking differently: Inhale 2 puffs into the lungs every 6 (six) hours as needed for wheezing or shortness of breath. ) 18 g 0    Objective: BP 110/71   Pulse (!) 107   Temp 98.2 F (36.8 C) (Oral)   Resp (!) 23   Ht 5\' 5"  (1.651 m)   Wt (!) 165 kg   SpO2 93%   BMI 60.53 kg/m  Exam: General: Alert and oriented, patient appears in respiratory distress with significant cough and and on BiPAP. Eyes: Pupils equal, mildly constricted, reactive to light ENTM: Pharyngeal erythema and nasal not assessed as patient on BiPAP Cardiovascular: Tachycardic with no murmurs noted. Distant heart sounds. Respiratory: Patient with expiratory wheezing bilaterally as well as some fine crackles diffusely.  Increased work of breathing noted with patient on BiPAP. Gastrointestinal: Bowel sounds present. No abdominal pain. Large pannus.  Extremities:  Lower limbs with trace pitting edema with left limb swelling greater than right though patient states this is chronic.  Derm: No rashes noted Neuro: No focal deficits Psych: Behavior and speech appropriate to situation  Labs and Imaging: CBC BMET  Recent Labs  Lab 08/09/19 0941 08/09/19 0941 08/09/19 1049  WBC 8.1  --   --   HGB 9.6*   < > 10.9*  HCT 34.3*   < > 32.0*  PLT 688*  --   --    < > = values in this interval not displayed.   Recent Labs  Lab 08/09/19 0941 08/09/19 0941 08/09/19 1049  NA 142   < > 142  K 3.0*   < > 2.7*  CL 100  --   --   CO2 30  --   --   BUN 8  --   --   CREATININE 0.69  --   --   GLUCOSE 152*  --   --   CALCIUM 8.7*  --   --    < > = values in this interval not displayed.      Lurline Del, DO 08/09/2019, 12:43 PM PGY-1, [place] Intern pager: [phone], text pages welcome  Resident Addendum I have separately seen and examined the patient.  I have discussed the findings and exam with the resident and agree with the above note.  I helped develop the management plan that is described in the residentt's note and I agree with the content.  Changes have been made in BLUE.    Addison Naegeli, MD PGY-2 [facility] residency program

## 2019-08-09 NOTE — ED Notes (Signed)
Pt transitioned to [address], per pt this is what she wears at home all the time

## 2019-08-09 NOTE — Progress Notes (Addendum)
CRITICAL VALUE ALERT  Critical Value: LACTIC ACID 3.2  Date & Time Notied:  08/09/19 1950  Provider Notified: Autry-Lott  Orders Received/Actions taken: Awaiting

## 2019-08-09 NOTE — ED Provider Notes (Signed)
[facility] EMERGENCY DEPARTMENT Provider Note   CSN: [id number] Arrival date & time:        History Chief Complaint  Patient presents with  . Respiratory Distress    [name] White is a 64 y.o. female.  HPI   Patient is an ill-appearing 64 year old female who presents on ambulance transport with acute respiratory failure.  According to the medical record the patient has a combination of COPD and possible congestive heart failure.  Her last echocardiogram was from December 2020 at which time she had ejection fraction of 60 to 65% with increased LVH she was seen in the emergency department on January 24 during which time she presented with acute hypoxemic respiratory failure.  This was thought to be related to COPD.  Initially it was thought that it was related to equipment failure with her oxygen concentrator.  She was treated for possible community-acquired pneumonia with doxycycline.  She presents again today after having several days of worsening shortness of breath.  She was found to be hypoxic at home and when the paramedics found her she was 84% on 7 L.  She was placed on a high flow nonrebreather with improvement but the patient remained dyspneic and in severe distress stating that she could not get enough oxygen.  She denies any swelling of her legs and states they look better, she is on furosemide.  She states she has had a dry cough which has been nonproductive and no associated fevers.  It is difficult to get history as the patient is in respiratory distress, level 5 caveat applies.  Past Medical History:  Diagnosis Date  . Abdominal wall hematoma 03/06/2019  . Acute bronchitis 08/29/2018  . Acute on chronic respiratory failure with hypoxia ([place]) 08/29/2018  . Anxiety   . Asthma   . Chronic lower back pain   . COPD (chronic obstructive pulmonary disease) ([place])   . Depression   . Exposure to COVID-19 virus 11/02/2018  . Fall 02/2019  . Family history of  anesthesia complication    "daughter; causes her to pass out afterwards"  . GERD (gastroesophageal reflux disease)   . History of atrial flutter 06/26/2015  . History of pulmonary embolus (PE) 11/03/2014  . Hypertension   . Hyperthyroidism   . Left medial tibial stress syndrome 12/26/2017  . Lower GI bleed   . Migraine    "monthly" (12/28/2013)  . Non-traumatic rhabdomyolysis 11/03/2014  . Obesity hypoventilation syndrome ([place]) 03/06/2019  . Osteoarthritis    "both knees; back of my neck; right pelvic bone" (12/28/2013)  . Paroxysmal A-fib ([place])   . Pulmonary embolism ([place]) 12/28/2013   "2 clots in each lung"    Patient Active Problem List   Diagnosis Date Noted  . Debility 08/02/2019  . Abscess 08/02/2019  . Acute hypoxemic respiratory failure ([place]) 07/21/2019  . Hypertension associated with diabetes ([place])   . Fever   . COPD (chronic obstructive pulmonary disease) ([place]) 06/21/2019  . Community acquired pneumonia 06/14/2019  . Weight gain due to medication 06/05/2019  . Acute respiratory failure with hypoxia ([place]) 05/28/2019  . Bronchitis with acute wheezing 05/09/2019  . Hyperglycemia 03/19/2019  . Acute on chronic respiratory failure with hypoxemia ([place]) 03/14/2019  . Acute on chronic heart failure with preserved ejection fraction ([place]) 03/14/2019  . Acquired thrombophilia ([place]) due to A-Fib 03/07/2019  . Heart failure with preserved ejection fraction ([place]) 03/06/2019  . Obesity hypoventilation syndrome ([place]) 03/06/2019  . Lower GI bleed   .  Urge incontinence 01/27/2019  . COPD exacerbation ([place]) 08/29/2018  . COPD with acute exacerbation ([place]) 08/07/2018  . OSA (obstructive sleep apnea) 07/05/2018  . Respiratory failure ([place]) 07/05/2018  . Severe episode of recurrent major depressive disorder, without psychotic features ([place])   . MDD (major depressive disorder), recurrent episode, severe ([place]) 09/07/2015  . Atrial flutter ([place]) 07/29/2015  . Asthma 11/12/2014  . History of  hypertension 11/05/2014  . Paroxysmal atrial fibrillation ([place]) 11/03/2014  . Chronic anticoagulation 11/03/2014  . Major depressive disorder 11/03/2014  . History of pulmonary embolus (PE) 11/03/2014  . MDD (major depressive disorder), recurrent severe, without psychosis ([place]) 03/05/2014  . Vocal cord dysfunction 11/12/2013  . Nonintractable headache 01/06/2012  . Hot flashes 04/22/2011  . OVERACTIVE BLADDER 04/18/2008  . Osteoarthritis of both knees 04/18/2008  . Osteoarthritis involving multiple joints on both sides of body 09/14/2007  . Morbid obesity ([place]) 08/24/2006  . RESTLESS LEGS SYNDROME 08/24/2006  . HYPERTENSION, BENIGN SYSTEMIC 08/24/2006  . RHINITIS, ALLERGIC 08/24/2006  . GASTROESOPHAGEAL REFLUX, NO ESOPHAGITIS 08/24/2006    Past Surgical History:  Procedure Laterality Date  . ABDOMINAL HYSTERECTOMY    . APPENDECTOMY    . BREAST CYST EXCISION Right   . DILATION AND CURETTAGE OF UTERUS    . ELECTROPHYSIOLOGIC STUDY N/A 05/27/2015   Procedure: A-Flutter;  Surgeon: Evans Lance, MD;  Location: [facility];  Service: Cardiovascular;  Laterality: N/A;  . EXCISIONAL HEMORRHOIDECTOMY    . NASAL SINUS SURGERY  2007  . THYROIDECTOMY, PARTIAL Right 2005  . TUBAL LIGATION    . WISDOM TOOTH EXTRACTION       OB History   No obstetric history on file.     Family History  Problem Relation Age of Onset  . Osteoarthritis Mother   . Asthma Mother   . Heart failure Mother   . Breast cancer Daughter     Social History   Tobacco Use  . Smoking status: Former Smoker    Packs/day: 0.25    Years: 20.00    Pack years: 5.00    Types: Cigarettes    Quit date: 07/17/1999    Years since quitting: 20.0  . Smokeless tobacco: Never Used  Substance Use Topics  . Alcohol use: Not Currently    Comment: "drank some in my 30's"  . Drug use: No    Home Medications Prior to Admission medications   Medication Sig Start Date End Date Taking? Authorizing Provider    acetaminophen (TYLENOL) 325 MG tablet Take 2 tablets (650 mg total) by mouth every 6 (six) hours as needed for mild pain. 09/11/18   Georgette Shell, MD  albuterol (PROVENTIL) (2.5 MG/3ML) 0.083% nebulizer solution Take 3 mLs (2.5 mg total) by nebulization every 6 (six) hours as needed for wheezing or shortness of breath. Patient not taking: Reported on 06/15/2019 07/09/18   Collene Gobble, MD  apixaban (ELIQUIS) 5 MG TABS tablet Take 1 tablet (5 mg total) by mouth 2 (two) times daily. 07/29/19   [name], MD  atorvastatin (LIPITOR) 40 MG tablet Take 1 tablet (40 mg total) by mouth daily at 6 PM. 07/25/19   Bonnita Hollow, MD  azelastine (ASTELIN) 0.1 % nasal spray Place 2 sprays into both nostrils 2 (two) times daily. Use in each nostril as directed Patient taking differently: Place 2 sprays into both nostrils daily.  06/01/19   Lattie Haw, MD  azelastine (OPTIVAR) 0.05 % ophthalmic solution Place 1 drop into both eyes 2 (  two) times daily as needed (itchy eyes).    [provider]  cycloSPORINE (RESTASIS) 0.05 % ophthalmic emulsion Place 1 drop into both eyes 2 (two) times daily.    [provider]  diclofenac sodium (VOLTAREN) 1 % GEL APPLY 2 GRAMS EXTERNALLY TO THE AFFECTED AREA FOUR TIMES DAILY Patient taking differently: Apply 2 g topically 4 (four) times daily as needed (arthritis pain - knees, shoulder and thighs).  07/31/18   [name], MD  diltiazem (CARDIZEM CD) 120 MG 24 hr capsule Take 1 capsule (120 mg total) by mouth daily. Patient taking differently: Take 120 mg by mouth at bedtime.  06/07/19   Patsey Berthold, NP  famotidine (PEPCID) 20 MG tablet Take 1 tablet (20 mg total) by mouth daily. 06/05/19   Anderson, Chelsey L, DO  flecainide (TAMBOCOR) 100 MG tablet TAKE 1 TABLET BY MOUTH 2 TIMES A DAY. PLEASE KEEP UPCOMING APPOINTMENT IN JANUARY BEFORE ANYMORE REFILLS. Patient taking differently: Take 100 mg by mouth 2 (two) times daily.  12/03/18   Seiler,  Amber K, NP  fluticasone (FLONASE) 50 MCG/ACT nasal spray Place 2 sprays into both nostrils daily.  10/29/18   [provider]  furosemide (LASIX) 80 MG tablet Take 1 tablet (80 mg total) by mouth daily. 06/16/19 07/21/19  Kathrene Alu, MD  gabapentin (NEURONTIN) 100 MG capsule Take 2 capsules (200 mg total) by mouth 3 (three) times daily. 07/31/19   [name], MD  guaiFENesin-dextromethorphan (ROBITUSSIN DM) 100-10 MG/5ML syrup Take 5 mLs by mouth every 4 (four) hours as needed for cough. Patient not taking: Reported on 07/21/2019 06/01/19   Lattie Haw, MD  hydrOXYzine (VISTARIL) 25 MG capsule Take 1 capsule (25 mg total) by mouth 2 (two) times daily as needed for anxiety. 06/02/19   Rory Percy, DO  ipratropium-albuterol (DUONEB) 0.5-2.5 (3) MG/3ML SOLN Take 3 mLs by nebulization every 12 (twelve) hours as needed (shortness of breath, wheezing). 07/07/19   Lyndee Hensen, MD  loratadine (CLARITIN) 10 MG tablet TAKE 1 TABLET(10 MG) BY MOUTH DAILY Patient taking differently: Take 10 mg by mouth daily.  03/05/19   [name], MD  Melatonin 3 MG TABS Take 1 tablet (3 mg total) by mouth at bedtime as needed (sleep). 07/07/19   Lyndee Hensen, MD  metoprolol tartrate (LOPRESSOR) 25 MG tablet Take 0.5 tablets (12.5 mg total) by mouth 2 (two) times daily. 06/02/19   Rory Percy, DO  mometasone-formoterol (DULERA) 100-5 MCG/ACT AERO Inhale 2 puffs into the lungs 2 (two) times daily. 06/05/19   Anderson, Chelsey L, DO  montelukast (SINGULAIR) 10 MG tablet Take 1 tablet (10 mg total) by mouth at bedtime. 06/01/19   Lattie Haw, MD  omeprazole (PRILOSEC) 20 MG capsule Take 1 capsule (20 mg total) by mouth daily. 06/01/19   Lattie Haw, MD  OXYGEN Inhale 3 L into the lungs continuous.     [provider]  polyethylene glycol (MIRALAX / GLYCOLAX) 17 g packet Take 17 g by mouth daily as needed for mild constipation. 07/25/19   Bonnita Hollow, MD  potassium chloride SA  (KLOR-CON) 20 MEQ tablet Take 2 tablets (40 mEq total) by mouth 2 (two) times daily. 06/16/19 07/21/19  Kathrene Alu, MD  topiramate (TOPAMAX) 50 MG tablet Take 1 tablet (50 mg total) by mouth daily. 07/25/19   Bonnita Hollow, MD  traMADol (ULTRAM) 50 MG tablet Take 1 tablet (50 mg total) by mouth every 12 (twelve) hours as needed (breakthrough pain. Must have  tried Tylenol first.). 07/07/19   Lyndee Hensen, MD  umeclidinium bromide (INCRUSE ELLIPTA) 62.5 MCG/INH AEPB Inhale 1 puff into the lungs daily. 07/08/19   Lyndee Hensen, MD  venlafaxine (EFFEXOR) 37.5 MG tablet Take 37.5 mg by mouth daily with breakfast. Take with a 150 mg ER capsule every morning    [provider]  venlafaxine XR (EFFEXOR-XR) 150 MG 24 hr capsule Take 1 capsule (150 mg total) by mouth daily with breakfast. Take 150mg  capsule with 37.5mg  tablet Patient taking differently: Take 150 mg by mouth daily with breakfast. Take with a 37.5 mg tablet every morning 06/01/19   Lattie Haw, MD  VENTOLIN HFA 108 (90 Base) MCG/ACT inhaler INHALE 2 PUFFS INTO THE LUNGS EVERY 6 HOURS AS NEEDED FOR WHEEZING OR SHORTNESS OF BREATH Patient taking differently: Inhale 2 puffs into the lungs every 6 (six) hours as needed for wheezing or shortness of breath.  06/07/19   [name], MD    Allergies    Caffeine, Hydralazine hcl, Hydrocodone, Ciprofloxacin, Erythromycin, Lisinopril, Oxycodone, and Sulfamethoxazole-trimethoprim  Review of Systems   Review of Systems  Unable to perform ROS: Acuity of condition    Physical Exam Updated Vital Signs BP 126/79   Pulse (!) 102   Temp 98.2 F (36.8 C) (Oral)   Resp (!) 21   Ht 1.651 m (5\' 5" )   Wt (!) 165 kg   SpO2 93%   BMI 60.53 kg/m   Physical Exam Vitals and nursing note reviewed.  Constitutional:      General: She is in acute distress.     Appearance: She is well-developed. She is ill-appearing.  HENT:     Head: Normocephalic and atraumatic.     Mouth/Throat:      Pharynx: No oropharyngeal exudate.  Eyes:     General: No scleral icterus.       Right eye: No discharge.        Left eye: No discharge.     Conjunctiva/sclera: Conjunctivae normal.     Pupils: Pupils are equal, round, and reactive to light.  Neck:     Thyroid: No thyromegaly.     Vascular: No JVD.  Cardiovascular:     Rate and Rhythm: Regular rhythm. Tachycardia present.     Heart sounds: Normal heart sounds. No murmur. No friction rub. No gallop.   Pulmonary:     Effort: Respiratory distress present.     Breath sounds: Wheezing and rales present.     Comments: The patient is speaking in 1-2 word sentences, she is in severe distress, she has prolonged expiratory phase but also has wheezing and rales. Abdominal:     General: Bowel sounds are normal. There is no distension.     Palpations: Abdomen is soft. There is no mass.     Tenderness: There is no abdominal tenderness.  Musculoskeletal:        General: No tenderness. Normal range of motion.     Cervical back: Normal range of motion and neck supple.  Lymphadenopathy:     Cervical: No cervical adenopathy.  Skin:    General: Skin is warm and dry.     Findings: No erythema or rash.  Neurological:     Mental Status: She is alert.     Coordination: Coordination normal.     Comments: The patient moves slow but is able to move from 1 stretcher to the other by herself.  Psychiatric:        Behavior: Behavior normal.  ED Results / Procedures / Treatments   Labs (all labs ordered are listed, but only abnormal results are displayed) Labs Reviewed  LACTIC ACID, PLASMA - Abnormal; Notable for the following components:      Result Value   Lactic Acid, Venous 2.6 (*)    All other components within normal limits  COMPREHENSIVE METABOLIC PANEL - Abnormal; Notable for the following components:   Potassium 3.0 (*)    Glucose, Bld 152 (*)    Calcium 8.7 (*)    Albumin 3.1 (*)    All other components within normal limits  CBC  WITH DIFFERENTIAL/PLATELET - Abnormal; Notable for the following components:   Hemoglobin 9.6 (*)    HCT 34.3 (*)    MCH 24.3 (*)    MCHC 28.0 (*)    RDW 19.7 (*)    Platelets 688 (*)    All other components within normal limits  POCT I-STAT 7, (LYTES, BLD GAS, ICA,H+H) - Abnormal; Notable for the following components:   pCO2 arterial 54.7 (*)    Bicarbonate 34.6 (*)    TCO2 36 (*)    Acid-Base Excess 8.0 (*)    Potassium 2.7 (*)    HCT 32.0 (*)    Hemoglobin 10.9 (*)    All other components within normal limits  RESPIRATORY PANEL BY RT PCR (FLU A&B, COVID)  CULTURE, BLOOD (ROUTINE X 2)  CULTURE, BLOOD (ROUTINE X 2)  URINE CULTURE  APTT  PROTIME-INR  BRAIN NATRIURETIC PEPTIDE  PROCALCITONIN  LACTIC ACID, PLASMA  URINALYSIS, ROUTINE W REFLEX MICROSCOPIC  BLOOD GAS, ARTERIAL    EKG EKG Interpretation  Date/Time:  Friday August 09 2019 09:27:55 EST Ventricular Rate:  84 PR Interval:    QRS Duration: 83 QT Interval:  356 QTC Calculation: 421 R Axis:   29 Text Interpretation: Normal sinus rhythm Nonspecific T wave abnormality Poor R wave progression Abnormal ekg since last tracing no significant change Confirmed by Noemi Chapel [phone]) on 08/09/2019 9:30:26 AM   Radiology DG Chest Port 1 View  Result Date: 08/09/2019 CLINICAL DATA:  Shortness of breath with decreased oxygen saturation EXAM: PORTABLE CHEST 1 VIEW COMPARISON:  July 21, 2019 FINDINGS: There is stable elevation of the right hemidiaphragm. There is ill-defined opacity in the left base, concerning for a degree of infiltrate. Lungs elsewhere clear. Heart is upper normal in size with pulmonary vascularity normal. No adenopathy. There is aortic atherosclerosis. No bone lesions. IMPRESSION: Suspect early pneumonia left base. Lungs elsewhere clear. Stable cardiac silhouette. Stable elevation of right hemidiaphragm. Aortic Atherosclerosis (ICD10-I70.0). Electronically Signed   By: Lowella Grip III M.D.   On:  08/09/2019 09:53    Procedures .Critical Care Performed by: Noemi Chapel, MD Authorized by: Noemi Chapel, MD   Critical care provider statement:    Critical care time (minutes):  35   Critical care time was exclusive of:  Separately billable procedures and treating other patients and teaching time   Critical care was necessary to treat or prevent imminent or life-threatening deterioration of the following conditions:  Respiratory failure   Critical care was time spent personally by me on the following activities:  Blood draw for specimens, development of treatment plan with patient or surrogate, discussions with consultants, evaluation of patient's response to treatment, examination of patient, obtaining history from patient or surrogate, ordering and performing treatments and interventions, ordering and review of laboratory studies, ordering and review of radiographic studies, pulse oximetry, re-evaluation of patient's condition and review of old charts Comments:         (  including critical care time)  Medications Ordered in ED Medications  albuterol (PROVENTIL,VENTOLIN) solution continuous neb (0 mg/hr Nebulization Stopped 08/09/19 1146)  albuterol (VENTOLIN HFA) 108 (90 Base) MCG/ACT inhaler 2 puff (0 puffs Inhalation Hold 08/09/19 0947)  AeroChamber Plus Flo-Vu Large MISC 1 each (0 each Other Hold 08/09/19 0947)  magnesium sulfate IVPB 2 g 50 mL (2 g Intravenous New Bag/Given 08/09/19 1134)  potassium chloride 10 mEq in 100 mL IVPB (10 mEq Intravenous New Bag/Given 08/09/19 1131)  methylPREDNISolone sodium succinate (SOLU-MEDROL) 125 mg/2 mL injection 125 mg (125 mg Intravenous Given 08/09/19 [id number])    ED Course  I have reviewed the triage vital signs and the nursing notes.  Pertinent labs & imaging results that were available during my care of the patient were reviewed by me and considered in my medical decision making (see chart for details).    MDM Rules/Calculators/A&P                        Patient is in acute respiratory distress with hypoxemic respiratory failure.  Will obtain an ABG and a chest x-ray.  She is placed on BiPAP on arrival due to her increased work of breathing.  She is critically ill.  Rule out pneumonia, Covid, COPD or CHF exacerbation.  Likely to be pulmonary embolism as the patient is already taking Eliquis.  This patient does have a borderline lactic acid however there is no fever or tachycardia or leukocytosis.  ABG is reassuring, she does have severe hypokalemia requiring magnesium and potassium  Discussed care with the family practice resident who will come to admit.  The patient is requiring BiPAP for respiratory failure.  [name] White was evaluated in Emergency Department on 08/09/2019 for the symptoms described in the history of present illness. She was evaluated in the context of the global COVID-19 pandemic, which necessitated consideration that the patient might be at risk for infection with the SARS-CoV-2 virus that causes COVID-19. Institutional protocols and algorithms that pertain to the evaluation of patients at risk for COVID-19 are in a state of rapid change based on information released by regulatory bodies including the CDC and federal and state organizations. These policies and algorithms were followed during the patient's care in the ED.    Final Clinical Impression(s) / ED Diagnoses Final diagnoses:  Acute respiratory failure with hypoxia ([place])      Noemi Chapel, MD 08/09/19 1225

## 2019-08-09 NOTE — Telephone Encounter (Signed)
Jerilyn calling nurse line to check status of verbal orders.   Talbot Grumbling, RN

## 2019-08-09 NOTE — Progress Notes (Signed)
Received page about critical lactic acid 3.2.  This is an increase from admission.  Given that patient only has a curb score of 1 and does not meet SIRS criteria we do not think this is classic lactic acidosis from sepsis.  Could be respiratory perfusion related.  Went to evaluate the patient in the room, she was not on BiPAP as ordered, she had just eaten dinner and was wearing nasal cannula.  She says she cannot wear the BiPAP for at least an hour after she eats so she wants another hour on the nasal cannula.  She was speaking in full sentences not in respiratory distress when I saw her which is reassuring, believe her nasal cannula for an hour then replace BiPAP.  Will recheck lactic acid at midnight.  We will also place order for n.p.o. sips with meds so that patient continues to use the BiPAP as long as she is in enough respiratory distress to require being in the hospital.  Dr. Criss Rosales

## 2019-08-10 ENCOUNTER — Encounter (HOSPITAL_COMMUNITY): Payer: Self-pay | Admitting: Family Medicine

## 2019-08-10 ENCOUNTER — Observation Stay (HOSPITAL_COMMUNITY): Payer: Medicare HMO

## 2019-08-10 DIAGNOSIS — E1169 Type 2 diabetes mellitus with other specified complication: Secondary | ICD-10-CM | POA: Diagnosis present

## 2019-08-10 DIAGNOSIS — E662 Morbid (severe) obesity with alveolar hypoventilation: Secondary | ICD-10-CM | POA: Diagnosis present

## 2019-08-10 DIAGNOSIS — D509 Iron deficiency anemia, unspecified: Secondary | ICD-10-CM | POA: Diagnosis present

## 2019-08-10 DIAGNOSIS — I480 Paroxysmal atrial fibrillation: Secondary | ICD-10-CM | POA: Diagnosis present

## 2019-08-10 DIAGNOSIS — G2581 Restless legs syndrome: Secondary | ICD-10-CM | POA: Diagnosis present

## 2019-08-10 DIAGNOSIS — Z86718 Personal history of other venous thrombosis and embolism: Secondary | ICD-10-CM | POA: Diagnosis not present

## 2019-08-10 DIAGNOSIS — F419 Anxiety disorder, unspecified: Secondary | ICD-10-CM | POA: Diagnosis present

## 2019-08-10 DIAGNOSIS — F329 Major depressive disorder, single episode, unspecified: Secondary | ICD-10-CM | POA: Diagnosis present

## 2019-08-10 DIAGNOSIS — J189 Pneumonia, unspecified organism: Principal | ICD-10-CM

## 2019-08-10 DIAGNOSIS — Z803 Family history of malignant neoplasm of breast: Secondary | ICD-10-CM | POA: Diagnosis not present

## 2019-08-10 DIAGNOSIS — R079 Chest pain, unspecified: Secondary | ICD-10-CM | POA: Diagnosis not present

## 2019-08-10 DIAGNOSIS — Z9981 Dependence on supplemental oxygen: Secondary | ICD-10-CM | POA: Diagnosis not present

## 2019-08-10 DIAGNOSIS — I152 Hypertension secondary to endocrine disorders: Secondary | ICD-10-CM | POA: Diagnosis present

## 2019-08-10 DIAGNOSIS — K219 Gastro-esophageal reflux disease without esophagitis: Secondary | ICD-10-CM | POA: Diagnosis present

## 2019-08-10 DIAGNOSIS — G8929 Other chronic pain: Secondary | ICD-10-CM | POA: Diagnosis present

## 2019-08-10 DIAGNOSIS — J383 Other diseases of vocal cords: Secondary | ICD-10-CM | POA: Diagnosis present

## 2019-08-10 DIAGNOSIS — Z20822 Contact with and (suspected) exposure to covid-19: Secondary | ICD-10-CM | POA: Diagnosis present

## 2019-08-10 DIAGNOSIS — Z6841 Body Mass Index (BMI) 40.0 and over, adult: Secondary | ICD-10-CM | POA: Diagnosis not present

## 2019-08-10 DIAGNOSIS — R0602 Shortness of breath: Secondary | ICD-10-CM | POA: Diagnosis not present

## 2019-08-10 DIAGNOSIS — J9612 Chronic respiratory failure with hypercapnia: Secondary | ICD-10-CM | POA: Diagnosis not present

## 2019-08-10 DIAGNOSIS — I5032 Chronic diastolic (congestive) heart failure: Secondary | ICD-10-CM | POA: Diagnosis present

## 2019-08-10 DIAGNOSIS — J9622 Acute and chronic respiratory failure with hypercapnia: Secondary | ICD-10-CM | POA: Diagnosis present

## 2019-08-10 DIAGNOSIS — J9601 Acute respiratory failure with hypoxia: Secondary | ICD-10-CM | POA: Diagnosis present

## 2019-08-10 DIAGNOSIS — J9621 Acute and chronic respiratory failure with hypoxia: Secondary | ICD-10-CM | POA: Diagnosis present

## 2019-08-10 DIAGNOSIS — E876 Hypokalemia: Secondary | ICD-10-CM | POA: Diagnosis present

## 2019-08-10 DIAGNOSIS — Z66 Do not resuscitate: Secondary | ICD-10-CM | POA: Diagnosis present

## 2019-08-10 DIAGNOSIS — Z86711 Personal history of pulmonary embolism: Secondary | ICD-10-CM | POA: Diagnosis not present

## 2019-08-10 DIAGNOSIS — J440 Chronic obstructive pulmonary disease with acute lower respiratory infection: Secondary | ICD-10-CM | POA: Diagnosis not present

## 2019-08-10 DIAGNOSIS — R05 Cough: Secondary | ICD-10-CM | POA: Diagnosis not present

## 2019-08-10 LAB — URINE CULTURE

## 2019-08-10 LAB — BASIC METABOLIC PANEL
Anion gap: 9 (ref 5–15)
BUN: 8 mg/dL (ref 8–23)
CO2: 29 mmol/L (ref 22–32)
Calcium: 8.9 mg/dL (ref 8.9–10.3)
Chloride: 104 mmol/L (ref 98–111)
Creatinine, Ser: 0.59 mg/dL (ref 0.44–1.00)
GFR calc Af Amer: >60 mL/min (ref >60)
GFR calc non Af Amer: >60 mL/min (ref >60)
Glucose, Bld: 132 mg/dL — ABNORMAL HIGH (ref 70–99)
Potassium: 3.4 mmol/L — ABNORMAL LOW (ref 3.5–5.1)
Sodium: 142 mmol/L (ref 135–145)

## 2019-08-10 LAB — GLUCOSE, CAPILLARY
Glucose-Capillary: 121 mg/dL — ABNORMAL HIGH (ref 70–99)
Glucose-Capillary: 90 mg/dL (ref 70–99)
Glucose-Capillary: 92 mg/dL (ref 70–99)
Glucose-Capillary: 105 mg/dL — ABNORMAL HIGH (ref 70–99)

## 2019-08-10 LAB — CBC
HCT: 32.7 % — ABNORMAL LOW (ref 36.0–46.0)
Hemoglobin: 9.2 g/dL — ABNORMAL LOW (ref 12.0–15.0)
MCH: 23.9 pg — ABNORMAL LOW (ref 26.0–34.0)
MCHC: 28.1 g/dL — ABNORMAL LOW (ref 30.0–36.0)
MCV: 84.9 fL (ref 80.0–100.0)
Platelets: 691 10*3/uL — ABNORMAL HIGH (ref 150–400)
RBC: 3.85 MIL/uL — ABNORMAL LOW (ref 3.87–5.11)
RDW: 19.8 % — ABNORMAL HIGH (ref 11.5–15.5)
WBC: 10.6 10*3/uL — ABNORMAL HIGH (ref 4.0–10.5)
nRBC: 0 % (ref 0.0–0.2)

## 2019-08-10 LAB — STREP PNEUMONIAE URINARY ANTIGEN: Strep Pneumo Urinary Antigen: NEGATIVE

## 2019-08-10 LAB — LACTIC ACID, PLASMA: Lactic Acid, Venous: 1.1 mmol/L (ref 0.5–1.9)

## 2019-08-10 MED ORDER — INSULIN ASPART 100 UNIT/ML ~~LOC~~ SOLN: 0.0000 [IU] | Freq: Every day | SUBCUTANEOUS | Status: DC

## 2019-08-10 MED ORDER — DOXYCYCLINE HYCLATE 100 MG PO TABS
100.0000 mg | ORAL_TABLET | Freq: Two times a day (BID) | ORAL | Status: DC
  Administered 2019-08-10 – 2019-08-12 (×4): 100 mg via ORAL
  Filled 2019-08-10 (×5): qty 1

## 2019-08-10 MED ORDER — FERROUS SULFATE 325 (65 FE) MG PO TABS
325.0000 mg | ORAL_TABLET | Freq: Every day | ORAL | Status: DC
  Administered 2019-08-11 – 2019-08-12 (×2): 325 mg via ORAL
  Filled 2019-08-10 (×2): qty 1

## 2019-08-10 MED ORDER — POTASSIUM CHLORIDE CRYS ER 20 MEQ PO TBCR
40.0000 meq | EXTENDED_RELEASE_TABLET | Freq: Once | ORAL | Status: AC
  Administered 2019-08-10: 40 meq via ORAL
  Filled 2019-08-10: qty 2

## 2019-08-10 MED ORDER — SODIUM CHLORIDE 0.9 % IV SOLN
1.0000 g | INTRAVENOUS | Status: DC
  Administered 2019-08-10 – 2019-08-12 (×3): 1 g via INTRAVENOUS
  Filled 2019-08-10 (×3): qty 1

## 2019-08-10 NOTE — Progress Notes (Signed)
Patient in no distress at this time.  Lactic acid in good range.  Weaning O2, and patient on [place] with a sat of 97%.  Put Bipap order as PRN, Bipap is still at bedside if needed.  Will continue to montior.

## 2019-08-10 NOTE — Progress Notes (Signed)
PT Cancellation Note  Patient Details Name: [name] White MRN: [id number] DOB: 09-10-1955   Cancelled Treatment:      PT attempted to see patient for initial eval. The patient is motivated to participate but just put her Bi-pap on and was trying to rest. She requested PT come back later. PT will follow up later if time permits or on [id number].    Carney Living PT DPT  08/10/2019, 2:27 PM

## 2019-08-10 NOTE — Progress Notes (Signed)
Family Medicine Teaching Service Daily Progress Note Intern Pager: [phone]  Patient name: [name] White Medical record number: [id number] Date of birth: 17-Aug-1955 Age: 64 y.o. Gender: female  Primary Care Provider: [name], MD Consultants: n/a Code Status: BNR  Pt Overview and Major Events to Date:  2/12 admitted  Assessment and Plan:  [name] White is a 64 y.o. female presenting with shortness of breath. PMH is significant for vocal cord dysfunction status post partial thyroidectomy, COPD/asthma, hypertension, history of PE, A. fib on Eliquis, anxiety, depression, chronic back pain, GERD, falls.  Acute hypoxic respiratory failure-stable on bipap overnight 2/12 Normally uses BiPAP at night and has a daily oxygen requirement of 4-5 L.  Lactic acid improved to 1.1 from 3.2. Pt afebrile with slight elevated white count 10.6, and negative for flu/covid. CXR showed possible early PNA in L lung base, however this appears consistent with previous CXRs. Etiology for patient's acute hypoxic respiratory failure can include viral lower respiratory infection, equipment malfunction or noncompliance, pulmonary embolism. Patient does have a Geneva score of 12 which puts her at high risk of PE, however during last admission (1/24) patient received a CTA for similar symptoms that was negative and is anticoagulated on Eliquis for history of DVTs, making this less likely.   -Continue BiPAP as needed (will trial off after bfast) -N.p.o. while on BiPAP -Follow-up on blood culture -Daily weights -Strict I's and O's -Low threshold for CTA -Up with assistance -PT/OT eval and treat -Continuous cardiac monitoring -Continuous pulse ox -Strict I's and O's -Daily weights  Hypokalemia- mild 3.2 am 2/13 Patient with a history of hypokalemia with home potassium dose of 31meq twice daily.  now s/p multiple runs IV K. -A.m. BMPs -one additional kdur above scheduled 40BID  HFpEF- stable likely  non-contirbutory Current BNP 40.1.  Most recent ECHO 12/02, LVEF 60-65%, normal LV function, severe increased LV hypertrophy.  Home medication Lasix 80 mg daily. Chest xray w/o pulmonary edema.  On exam has trace pitting edema in the left leg.  If elevated BNP, consider IV lasix. Overall, does not appear to be in heart failure.  -Continue home medications once not n.p.o. on BiPAP -daily weights -strict intake and output  Chest pressure -resolved  OSA/OHS Patient with trilogy BiPAP at home. -BiPAP as needed -BiPAP at night -Consider sleep study outpatient  Diabetes-stable Patient with recent diagnosis of diabetes during admission this past December.  HbA1C at that time of 7.8.  Glucose on admission 152.  During previous hospitalization it was thought that patient's 7.8 A1c was related to her numerous steroid treatments and she was not discharged home on any diabetic medications. -Sensitive sliding scale -monitor CBG -added qhs coverage -consider meds on d/c  History of pulmonary embolism- Less likely contribution to symptoms and already on eliquis Patient with a history of pulmonary embolism with chronic swelling of the left lower limb.  Home medication Eliquis. Geneva score 12.  Recent CTA performed on 1/24 which was negative for pulmonary embolism. -Continue Eliquis 5mg  BID -Low threshold to initiate CTA  Paroxysmal AFIB- not in RVR Home medication Digoxin 120 mg qhs, Metoprolol 12.5 mg BID, Flecanide 100 mg BID, Eliquis.  Per prior notes patient may have a propensity to occasionally flip into A. fib which could increase her chest pressure/anxiety sensations. -Continue home medication once not n.p.o. on BiPAP   History of HTN-stable  Home medication, Metopriolol, Cardizem, Lasix -Continue home medication once not n.p.o. on BiPAP  Morbid Obesity-stable Current weight of 165  kg, BMI 60.53.  This weight seems to be approximately the same as her last hospitalization with  weights at that time from 164-169 kg.   -Would not be an optimal candidate for bariatric surgery but may consider referral on outpatient -encourage healthy lifestyle and weight management  ?COPD/Asthma vs. Vocal Cord dysfunction Recent PFT's show no evidence of COPD/Asthma. Possibly vocal cord dysfunction, on home oxygen of 6L.  Followed by Dr.Byrum, Pulmonology.  Missed last appointment.  Home medications Montelukast, Albuterol, Duo-nebs, Dulera. Was followed by Pulmonology, but medications were not discontinued. Recommend avoiding systemic corticosteroids as this may worsen vocal cord dysfunction. -continue home medications  -monitor resp status   MDD/Anxiety-stable Patient with a history of anxiety. Her home medications include Atarax 25 mg, Effexor 187.5 mg daily  -Continue home medications  GERD Home meds Prilosec 20 mg daily and Pepcid 20 mg.  -continue home meds   FEN/GI: N.p.o. while on BiPAP Prophylaxis: Eliquis  Disposition: Admit to progressive  FEN/GI: heart healthy carb modified PPx: eliquis  Disposition: to home when stabilized (will trial off BiPAP after breakfast and attempt to wean to home 4 to 5 L nasal cannula)  Subjective:  Patient said she had been breathing quite well on BiPAP overnight.  Her main concern to be making sure that she is able to get off the BiPAP and maintain the status.  She wants to be taken off so that she can try eating breakfast, she says she cannot take all of her meds without getting a meal because it gives her an upset stomach.  She said that if she felt like this at home she would not have come into the hospital.  No other complaints.  Objective: Temp:  [98.2 F (36.8 C)-98.3 F (36.8 C)] 98.2 F (36.8 C) (02/12 2259) Pulse Rate:  [81-110] 93 (02/12 2306) Resp:  [15-33] 26 (02/12 2259) BP: (99-136)/(53-104) 110/94 (02/12 2259) SpO2:  [93 %-100 %] 100 % (02/12 2306) FiO2 (%):  [3 %-50 %] 3 % (02/12 2259) Weight:  [165 kg] 165 kg  (02/12 0928) Physical Exam: General: Obese patient in no distress on BiPAP, speaking in full sentences Cardiovascular: Regular rhythm although tachypneic to approximately 100 Respiratory: BiPAP impedes auscultation but there is some mild wheeze in a baseline work of breathing present, no cough during my exam with her, speaking in full sentences Abdomen: Obese, no tenderness Extremities: Trace edema to the legs  Laboratory: Recent Labs  Lab 08/09/19 0941 08/09/19 1049 08/10/19 0335  WBC 8.1  --  10.6*  HGB 9.6* 10.9* 9.2*  HCT 34.3* 32.0* 32.7*  PLT 688*  --  691*   Recent Labs  Lab 08/09/19 0941 08/09/19 0941 08/09/19 1049 08/09/19 1904 08/10/19 0335  NA 142   < > 142 141 142  K 3.0*   < > 2.7* 3.8 3.4*  CL 100  --   --  101 104  CO2 30  --   --  28 29  BUN 8  --   --  10 8  CREATININE 0.69  --   --  0.63 0.59  CALCIUM 8.7*  --   --  8.6* 8.9  PROT 7.1  --   --   --   --   BILITOT 0.4  --   --   --   --   ALKPHOS 76  --   --   --   --   ALT 22  --   --   --   --  AST 31  --   --   --   --   GLUCOSE 152*  --   --  214* 132*   < > = values in this interval not displayed.    Lactic acid improved to 1.1 from 3.2  Imaging/Diagnostic Tests: DG Chest Port 1 View  Result Date: 08/09/2019 CLINICAL DATA:  Shortness of breath with decreased oxygen saturation EXAM: PORTABLE CHEST 1 VIEW COMPARISON:  July 21, 2019 FINDINGS: There is stable elevation of the right hemidiaphragm. There is ill-defined opacity in the left base, concerning for a degree of infiltrate. Lungs elsewhere clear. Heart is upper normal in size with pulmonary vascularity normal. No adenopathy. There is aortic atherosclerosis. No bone lesions. IMPRESSION: Suspect early pneumonia left base. Lungs elsewhere clear. Stable cardiac silhouette. Stable elevation of right hemidiaphragm. Aortic Atherosclerosis (ICD10-I70.0). Electronically Signed   By: Lowella Grip III M.D.   On: 08/09/2019 09:53     Sherene Sires, DO 08/10/2019, 5:40 AM PGY-3, [place] Intern pager: [phone], text pages welcome

## 2019-08-11 DIAGNOSIS — J9612 Chronic respiratory failure with hypercapnia: Secondary | ICD-10-CM

## 2019-08-11 LAB — GLUCOSE, CAPILLARY
Glucose-Capillary: 119 mg/dL — ABNORMAL HIGH (ref 70–99)
Glucose-Capillary: 106 mg/dL — ABNORMAL HIGH (ref 70–99)
Glucose-Capillary: 80 mg/dL (ref 70–99)
Glucose-Capillary: 103 mg/dL — ABNORMAL HIGH (ref 70–99)

## 2019-08-11 LAB — CBC
HCT: 33.9 % — ABNORMAL LOW (ref 36.0–46.0)
Hemoglobin: 9.2 g/dL — ABNORMAL LOW (ref 12.0–15.0)
MCH: 24.1 pg — ABNORMAL LOW (ref 26.0–34.0)
MCHC: 27.1 g/dL — ABNORMAL LOW (ref 30.0–36.0)
MCV: 88.7 fL (ref 80.0–100.0)
Platelets: 712 10*3/uL — ABNORMAL HIGH (ref 150–400)
RBC: 3.82 MIL/uL — ABNORMAL LOW (ref 3.87–5.11)
RDW: 20.1 % — ABNORMAL HIGH (ref 11.5–15.5)
WBC: 10.3 10*3/uL (ref 4.0–10.5)
nRBC: 0 % (ref 0.0–0.2)

## 2019-08-11 LAB — BASIC METABOLIC PANEL
Anion gap: 10 (ref 5–15)
BUN: 16 mg/dL (ref 8–23)
CO2: 31 mmol/L (ref 22–32)
Calcium: 8.5 mg/dL — ABNORMAL LOW (ref 8.9–10.3)
Chloride: 103 mmol/L (ref 98–111)
Creatinine, Ser: 0.64 mg/dL (ref 0.44–1.00)
GFR calc Af Amer: >60 mL/min (ref >60)
GFR calc non Af Amer: >60 mL/min (ref >60)
Glucose, Bld: 102 mg/dL — ABNORMAL HIGH (ref 70–99)
Potassium: 3.9 mmol/L (ref 3.5–5.1)
Sodium: 144 mmol/L (ref 135–145)

## 2019-08-11 MED ORDER — DOXYCYCLINE HYCLATE 100 MG PO TABS: 100.0000 mg | ORAL_TABLET | Freq: Two times a day (BID) | ORAL | 0 refills | Status: AC

## 2019-08-11 MED ORDER — CEFDINIR 300 MG PO CAPS: 300.0000 mg | ORAL_CAPSULE | Freq: Two times a day (BID) | ORAL | 0 refills | Status: AC

## 2019-08-11 MED ORDER — FERROUS SULFATE 325 (65 FE) MG PO TABS: 325.0000 mg | ORAL_TABLET | Freq: Every day | ORAL | 0 refills | Status: DC

## 2019-08-11 NOTE — Care Management (Signed)
Patient active w [employer] for RN PT OT HHA SW. HH order modified to include these disciplines. [facility] notified of DC today.

## 2019-08-11 NOTE — Progress Notes (Signed)
FPTS Brief Note Patient seen early this AM. She reports she feels well. Vitals stable, back on home oxygen.  Exam notable for euvolemia, improved air entry.  Will plan for PT evaluation, discharge home on oral antibiotics for 10 days.  Recommend close follow up with PCP and Pulmonary. Consider repeat imaging as indicated.  Full attestation to follow.   Dorris Singh, MD  Family Medicine Teaching Service

## 2019-08-11 NOTE — Progress Notes (Signed)
Patient will require [facility] transportation home. TOC have left for the evening and this RN has attempted to reach [facility] with no luck. Appears this patient will not be dc'd today and will require assistance from [facility] in the morning to arrange transportation home.

## 2019-08-11 NOTE — Evaluation (Signed)
Physical Therapy Evaluation Patient Details Name: [name] White MRN: [id number] DOB: 1955/09/25 Today's Date: 08/11/2019   History of Present Illness  64 y.o. female presenting with shortness of breath. PMH is significant for vocal cord dysfunction status post partial thyroidectomy, COPD/asthma, hypertension, history of PE, A. fib on Eliquis, anxiety, depression, chronic back pain, GERD, falls  Clinical Impression  Pt presents to PT with deficits in functional mobility, gait, balance, endurance. Pt is not far from her functional baseline, transferring from bed to commode and back without much physical assistance requirements. Pt does currently require increased supplemental oxygen and fatigues quickly. Pt will benefit from continued acute PT POC to improve activity tolerance and functional mobility in order to reduce caregiver burden.    Follow Up Recommendations Home health PT;Supervision for mobility/OOB    Equipment Recommendations  None recommended by PT(pt owns necessary DME)    Recommendations for Other Services       Precautions / Restrictions Precautions Precautions: Fall Precaution Comments: watch o2 sats Restrictions Weight Bearing Restrictions: No      Mobility  Bed Mobility Overal bed mobility: Needs Assistance Bed Mobility: Supine to Sit;Sit to Supine     Supine to sit: Min assist;HOB elevated Sit to supine: Modified independent (Device/Increase time)      Transfers Overall transfer level: Needs assistance Equipment used: None Transfers: Sit to/from [facility] Sit to Stand: Supervision Stand pivot transfers: Supervision       General transfer comment: pt transfers to recliner and back to bed  Ambulation/Gait                Stairs            Wheelchair Mobility    Modified Rankin (Stroke Patients Only)       Balance Overall balance assessment: Needs assistance Sitting-balance support: No upper extremity  supported;Feet supported Sitting balance-Leahy Scale: Good Sitting balance - Comments: modI   Standing balance support: Bilateral upper extremity supported Standing balance-Leahy Scale: Good Standing balance comment: supervision with UE support of recliner/bed                             Pertinent Vitals/Pain Pain Assessment: No/denies pain    Home Living Family/patient expects to be discharged to:: Private residence Living Arrangements: Other relatives Available Help at Discharge: Family;Available PRN/intermittently;Other (Comment) Type of Home: Apartment Home Access: Stairs to enter Entrance Stairs-Rails: None Entrance Stairs-Number of Steps: 1 Home Layout: One level Home Equipment: Walker - 2 wheels;Cane - single point;Walker - 4 wheels;Hospital bed;Other (comment);Tub bench;Bedside commode;Wheelchair - manual Additional Comments: lives with niece; will also have home aide coming for a couple of hours a day when she gets back home    Prior Function Level of Independence: Needs assistance   Gait / Transfers Assistance Needed: stand step and turn transfers from hospital bed to bedside commode and back  ADL's / Homemaking Assistance Needed: wears pull up and uses bed side bath, uses 3n1 for bowel movement only         Hand Dominance   Dominant Hand: Right    Extremity/Trunk Assessment   Upper Extremity Assessment Upper Extremity Assessment: Overall WFL for tasks assessed    Lower Extremity Assessment Lower Extremity Assessment: Overall WFL for tasks assessed    Cervical / Trunk Assessment Cervical / Trunk Assessment: Other exceptions Cervical / Trunk Exceptions: morbid obesity  Communication   Communication: No difficulties  Cognition Arousal/Alertness: Awake/alert Behavior During  Therapy: WFL for tasks assessed/performed Overall Cognitive Status: Within Functional Limits for tasks assessed                                         General Comments General comments (skin integrity, edema, etc.): 6L [place], pt saturating 92% and above, some wheezing noted pre-mobility    Exercises     Assessment/Plan    PT Assessment Patient needs continued PT services  PT Problem List Decreased activity tolerance;Decreased balance;Decreased mobility;Cardiopulmonary status limiting activity       PT Treatment Interventions DME instruction;Gait training;Functional mobility training;Therapeutic activities;Stair training;Therapeutic exercise;Balance training;Neuromuscular re-education;Patient/family education;Wheelchair mobility training    PT Goals (Current goals can be found in the Care Plan section)  Acute Rehab PT Goals Patient Stated Goal: To go home and stop coming back to the hospital PT Goal Formulation: With patient Time For Goal Achievement: 08/25/19 Potential to Achieve Goals: Fair    Frequency Min 3X/week   Barriers to discharge        Co-evaluation               AM-PAC PT "6 Clicks" Mobility  Outcome Measure Help needed turning from your back to your side while in a flat bed without using bedrails?: A Little Help needed moving from lying on your back to sitting on the side of a flat bed without using bedrails?: A Little Help needed moving to and from a bed to a chair (including a wheelchair)?: None Help needed standing up from a chair using your arms (e.g., wheelchair or bedside chair)?: None Help needed to walk in hospital room?: A Lot Help needed climbing 3-5 steps with a railing? : Total 6 Click Score: 17    End of Session Equipment Utilized During Treatment: Oxygen Activity Tolerance: Patient tolerated treatment well Patient left: in bed;with call bell/phone within reach Nurse Communication: Mobility status PT Visit Diagnosis: Muscle weakness (generalized) (M62.81);Other (comment)(cardiopulmonary endurance deficits)    Time: [id number] PT Time Calculation (min) (ACUTE ONLY): 33 min   Charges:    PT Evaluation $PT Eval Moderate Complexity: 1 Mod PT Treatments $Therapeutic Activity: 8-22 mins        Zenaida Niece, PT, DPT Acute Rehabilitation Pager: [phone]   Zenaida Niece 08/11/2019, 10:29 AM

## 2019-08-11 NOTE — Progress Notes (Addendum)
FMTS Attending Daily Note: [name] Singh, MD  Team Pager [phone] Pager [phone]  I have seen and examined this patient, reviewed their chart. I have discussed this patient with the resident. I agree with the resident's findings, assessment and care plan.   Family Medicine Teaching Service Daily Progress Note Intern Pager: [phone]  Patient name: [name] White Medical record number: [id number] Date of birth: 10-23-1955 Age: 64 y.o. Gender: female  Primary Care Provider: [name], MD Consultants: n/a Code Status: BNR  Pt Overview and Major Events to Date:  2/12 admitted  Assessment and Plan:  [name] White is a 64 y.o. female presenting with shortness of breath. PMH is significant for vocal cord dysfunction status post partial thyroidectomy, COPD/asthma, hypertension, history of PE, A. fib on Eliquis, anxiety, depression, chronic back pain, GERD, falls.  Acute hypoxic respiratory failure-stable, due to CAP, improved now that using BiPAP. Chronically on AC, less likely PE.   -Continue BiPAP as needed, home oxygen while awake  -Follow-up on blood culture -Daily weights -Strict I's and O's -Up with assistance -PT/OT eval and treat -Continuous cardiac monitoring -Continuous pulse ox -Strict I's and O's -Daily weights  Left lower lobe pneumonia Patient on ceftriaxone and doxycycline.  Patient afebrile since admission.  Does endorse increased oxygen need and increased cough over the last few days.  WBC currently 10.3. -Continue ceftriaxone/doxycycline -Monitor fever curve  Hypokalemia- mild Potassium this morning 3.9.  Patient with a history of hypokalemia with home potassium dose of 12meq twice daily.  Continue home dose of 40 meq K+ twice per day. -A.m. BMPs  Anemia-stable Current hemoglobin 9.2.  Patient with reduced ferritin during hospitalization in January. -Daily CBC -Start iron supplementation. -Recommend outpatient follow-up  HFpEF- stable likely  non-contirbutory BNP on admission 40.1.  Most recent ECHO 12/02, LVEF 60-65%, normal LV function, severe increased LV hypertrophy.  Home medication Lasix 80 mg daily. Chest xray w/o pulmonary edema.  On exam has trace pitting edema in the left leg.  If elevated BNP, consider IV lasix. Overall, does not appear to be in heart failure.  -Continue home medications once not n.p.o. on BiPAP -daily weights -strict intake and output  Chest pressure -resolved  OSA/OHS Patient with trilogy BiPAP at home. -BiPAP as needed -BiPAP at night -Consider sleep study outpatient  Diabetes-stable Patient with recent diagnosis of diabetes during admission this past December.  HbA1C at that time of 7.8.    Current glucose 102.  During previous hospitalization it was thought that patient's 7.8 A1c was related to her numerous steroid treatments and she was not discharged home on any diabetic medications. -Sensitive sliding scale -monitor CBG -added qhs coverage -consider meds on d/c  History of pulmonary embolism- Less likely contribution to symptoms and already on eliquis Patient with a history of pulmonary embolism with chronic swelling of the left lower limb.  Home medication Eliquis. Geneva score 12.  Recent CTA performed on 1/24 which was negative for pulmonary embolism. -Continue Eliquis 5mg  BID -Low threshold to initiate CTA  Paroxysmal AFIB- not in RVR Home medication Diltiazem120 mg qhs, Metoprolol 12.5 mg BID, Flecanide 100 mg BID, Eliquis.  Per prior notes patient may have a propensity to occasionally flip into A. fib which could increase her chest pressure/anxiety sensations. -Continue home medications  History of HTN-stable  Home medication, Metopriolol, Cardizem, Lasix -Continue home medication once not n.p.o. on BiPAP  Morbid Obesity-stable Current weight of 163.9kg, BMI 60.13.  This weight seems to be approximately the same as  her last hospitalization with weights at that time from  164-169 kg.   -Would not be an optimal candidate for bariatric surgery but may consider referral on outpatient -encourage healthy lifestyle and weight management  Vocal Cord Dysfunction  Recent PFT's show no evidence of COPD/Asthma. Possibly vocal cord dysfunction, on home oxygen of 6L.  Followed by [name] White, Pulmonology.  Missed last appointment.  Home medications Montelukast, Albuterol, Duo-nebs, Dulera. Was followed by Pulmonology, but medications were not discontinued. Recommend avoiding systemic corticosteroids as this may worsen vocal cord dysfunction. -continue home medications  -monitor resp status   MDD/Anxiety-stable Patient with a history of anxiety. Her home medications include Atarax 25 mg, Effexor 187.5 mg daily  -Continue home medications  GERD Home meds Prilosec 20 mg daily and Pepcid 20 mg.  -continue home meds   FEN/GI: Heart healthy/carb modified, n.p.o. while on BiPAP Prophylaxis: Eliquis  Disposition: to home when stabilized   Subjective:  Patient in no distress this morning.  States she feels back to her baseline and is on her baseline oxygen.  Objective: Temp:  [97.9 F (36.6 C)-98.6 F (37 C)] 98 F (36.7 C) (02/14 0400) Pulse Rate:  [80-99] 81 (02/14 0400) Resp:  [18-20] 20 (02/14 0400) BP: (113-118)/(65-80) 113/77 (02/14 0400) SpO2:  [97 %-100 %] 98 % (02/14 0400) Weight:  [163.9 kg] 163.9 kg (02/14 0500) Physical Exam: General: Alert and oriented in no apparent distress Heart: Regular rate and rhythm with no murmurs appreciated Lungs: Fine crackles noted in the bases bilaterally with left worse than right, no respiratory distress, satting well on 6 L nasal cannula (home oxygen level) Abdomen: Bowel sounds present, no abdominal pain Skin: Warm and dry  Laboratory: Recent Labs  Lab 08/09/19 0941 08/09/19 0941 08/09/19 1049 08/10/19 0335 08/11/19 0251  WBC 8.1  --   --  10.6* 10.3  HGB 9.6*   < > 10.9* 9.2* 9.2*  HCT 34.3*   < > 32.0*  32.7* 33.9*  PLT 688*  --   --  691* 712*   < > = values in this interval not displayed.   Recent Labs  Lab 08/09/19 0941 08/09/19 1049 08/09/19 1904 08/10/19 0335 08/11/19 0251  NA 142   < > 141 142 144  K 3.0*   < > 3.8 3.4* 3.9  CL 100   < > 101 104 103  CO2 30   < > 28 29 31   BUN 8   < > 10 8 16   CREATININE 0.69   < > 0.63 0.59 0.64  CALCIUM 8.7*   < > 8.6* 8.9 8.5*  PROT 7.1  --   --   --   --   BILITOT 0.4  --   --   --   --   ALKPHOS 76  --   --   --   --   ALT 22  --   --   --   --   AST 31  --   --   --   --   GLUCOSE 152*   < > 214* 132* 102*   < > = values in this interval not displayed.    Lactic acid improved to 1.1 from 3.2  Imaging/Diagnostic Tests: DG Chest Port 1 View  Result Date: 08/09/2019 CLINICAL DATA:  Shortness of breath with decreased oxygen saturation EXAM: PORTABLE CHEST 1 VIEW COMPARISON:  July 21, 2019 FINDINGS: There is stable elevation of the right hemidiaphragm. There is ill-defined opacity in the left base,  concerning for a degree of infiltrate. Lungs elsewhere clear. Heart is upper normal in size with pulmonary vascularity normal. No adenopathy. There is aortic atherosclerosis. No bone lesions. IMPRESSION: Suspect early pneumonia left base. Lungs elsewhere clear. Stable cardiac silhouette. Stable elevation of right hemidiaphragm. Aortic Atherosclerosis (ICD10-I70.0). Electronically Signed   By: Lowella Grip III M.D.   On: 08/09/2019 09:53     Lurline Del, DO 08/11/2019, 6:28 AM PGY-1, [place] Intern pager: [phone], text pages welcome

## 2019-08-11 NOTE — Discharge Summary (Addendum)
[facility] Discharge Summary  Patient name: [name] White Medical record number: [id number] Date of birth: December 20, 1955 Age: 64 y.o. Gender: female Date of Admission: 08/09/2019  Date of Discharge: 08/12/2019 Admitting Physician: Martyn Malay, MD  Primary Care Provider: [name], MD Consultants: None  Indication for Hospitalization: Hypoxic respiratory failure  Discharge Diagnoses/Problem List:  Active Problems:   Acute hypoxemic respiratory failure [facility])  Disposition: Home  Discharge Condition: Stable  Discharge Exam:  General: Alert and oriented in no apparent distress Heart: Regular rate and rhythm with no murmurs appreciated Lungs: CTA bilaterally, breathing well on home dose of oxygen (5 L). Abdomen: Bowel sounds present, no abdominal pain Skin: Warm and dry  Brief Hospital Course:  Patient admitted on 2/12 with increased cough, increased oxygen requirement and increased work of breathing.  Chest x-ray showed signs of early left lower lobe pneumonia.  Patient was initially placed on BiPAP and started on ceftriaxone and doxycycline.  Patient did well on BiPAP throughout the day and overnight and was transitioned to nasal cannula the following morning.  On the day of discharge patient endorsed she was back at her baseline, and was breathing on 6 L nasal cannula which was her baseline oxygen requirement.  On the day of discharge she was seen by physical therapy who recommended home health PT. Patient was discharged home with remaining antibiotics to complete a total of 7-day course  Hypokalemia Patient with potassium requirement of 24meq twice daily at home.  Initially given IV potassium while n.p.o. on BiPAP and transition to 40 twice daily during hospitalization. On day of discharge potassium level was 3.9.  Issues for Follow Up:  1. Patient with new diagnosis of diabetes, recommend initiate medications 2. Patient with anemia that appears  to be due to low iron, recommend outpatient follow-up 3. Consider sleep study 4. Recommend PCP recheck potassium level within one week of discharge 5. Patient discharged on home dose of Lasix 40 mg/day, states she is no longer taking 80 mg/day which was also listed as a home dose in addition to the 40mg /day dosing. 6. Recommended the patient stop home tramadol she states she has not taken this 7. Discharged on 5 additional days of doxycycline and cefdinir for CAP (7 days total).  Significant Procedures: None  Significant Labs and Imaging:  Recent Labs  Lab 08/09/19 0941 08/09/19 0941 08/09/19 1049 08/10/19 0335 08/11/19 0251  WBC 8.1  --   --  10.6* 10.3  HGB 9.6*   < > 10.9* 9.2* 9.2*  HCT 34.3*   < > 32.0* 32.7* 33.9*  PLT 688*  --   --  691* 712*   < > = values in this interval not displayed.   Recent Labs  Lab 08/09/19 0941 08/09/19 0941 08/09/19 1049 08/09/19 1049 08/09/19 1904 08/09/19 1904 08/10/19 0335 08/11/19 0251  NA 142  --  142  --  141  --  142 144  K 3.0*   < > 2.7*   < > 3.8   < > 3.4* 3.9  CL 100  --   --   --  101  --  104 103  CO2 30  --   --   --  28  --  29 31  GLUCOSE 152*  --   --   --  214*  --  132* 102*  BUN 8  --   --   --  10  --  8 16  CREATININE 0.69  --   --   --  0.63  --  0.59 0.64  CALCIUM 8.7*  --   --   --  8.6*  --  8.9 8.5*  ALKPHOS 76  --   --   --   --   --   --   --   AST 31  --   --   --   --   --   --   --   ALT 22  --   --   --   --   --   --   --   ALBUMIN 3.1*  --   --   --   --   --   --   --    < > = values in this interval not displayed.     Results/Tests Pending at Time of Discharge: Urine Legionella  Discharge Medications:  Allergies as of 08/12/2019      Reactions   Caffeine Other (See Comments)   Migraine   Hydralazine Hcl Other (See Comments)   Other reaction(s): Other (See Comments) AKI leading to rhabdo and electrolyte abnormalities   Hydrocodone Nausea And Vomiting   Headache   Ciprofloxacin Hives,  Rash   Erythromycin Hives, Rash   Lisinopril Cough   Oxycodone Nausea And Vomiting   Headache   Sulfamethoxazole-trimethoprim Rash      Medication List    STOP taking these medications   traMADol 50 MG tablet Commonly known as: ULTRAM     TAKE these medications   acetaminophen 325 MG tablet Commonly known as: TYLENOL Take 2 tablets (650 mg total) by mouth every 6 (six) hours as needed for mild pain.   albuterol (2.5 MG/3ML) 0.083% nebulizer solution Commonly known as: PROVENTIL Take 3 mLs (2.5 mg total) by nebulization every 6 (six) hours as needed for wheezing or shortness of breath. What changed: Another medication with the same name was changed. Make sure you understand how and when to take each.   Ventolin HFA 108 (90 Base) MCG/ACT inhaler Generic drug: albuterol INHALE 2 PUFFS INTO THE LUNGS EVERY 6 HOURS AS NEEDED FOR WHEEZING OR SHORTNESS OF BREATH What changed: See the new instructions.   apixaban 5 MG Tabs tablet Commonly known as: ELIQUIS Take 1 tablet (5 mg total) by mouth 2 (two) times daily.   atorvastatin 40 MG tablet Commonly known as: LIPITOR Take 1 tablet (40 mg total) by mouth daily at 6 PM.   azelastine 0.05 % ophthalmic solution Commonly known as: OPTIVAR Place 1 drop into both eyes 2 (two) times daily as needed (itchy eyes).   azelastine 0.1 % nasal spray Commonly known as: ASTELIN Place 2 sprays into both nostrils 2 (two) times daily. Use in each nostril as directed What changed:   when to take this  additional instructions   cefdinir 300 MG capsule Commonly known as: OMNICEF Take 1 capsule (300 mg total) by mouth 2 (two) times daily for 5 days.   cycloSPORINE 0.05 % ophthalmic emulsion Commonly known as: RESTASIS Place 1 drop into both eyes 2 (two) times daily.   diclofenac sodium 1 % Gel Commonly known as: VOLTAREN APPLY 2 GRAMS EXTERNALLY TO THE AFFECTED AREA FOUR TIMES DAILY What changed: See the new instructions.   diltiazem  120 MG 24 hr capsule Commonly known as: CARDIZEM CD Take 1 capsule (120 mg total) by mouth daily. What changed: when to take this   doxycycline 100 MG tablet Commonly known as: VIBRA-TABS Take 1 tablet (100 mg total) by mouth every 12 (  twelve) hours for 5 days.   famotidine 20 MG tablet Commonly known as: PEPCID Take 1 tablet (20 mg total) by mouth daily.   ferrous sulfate 325 (65 FE) MG tablet Take 1 tablet (325 mg total) by mouth daily with breakfast.   flecainide 100 MG tablet Commonly known as: TAMBOCOR TAKE 1 TABLET BY MOUTH 2 TIMES A DAY. PLEASE KEEP UPCOMING APPOINTMENT IN JANUARY BEFORE ANYMORE REFILLS. What changed: See the new instructions.   fluticasone 50 MCG/ACT nasal spray Commonly known as: FLONASE Place 2 sprays into both nostrils daily.   furosemide 40 MG tablet Commonly known as: LASIX Take 40 mg by mouth daily. What changed: Another medication with the same name was removed. Continue taking this medication, and follow the directions you see here.   gabapentin 100 MG capsule Commonly known as: NEURONTIN Take 2 capsules (200 mg total) by mouth 3 (three) times daily.   guaiFENesin-dextromethorphan 100-10 MG/5ML syrup Commonly known as: ROBITUSSIN DM Take 5 mLs by mouth every 4 (four) hours as needed for cough.   hydrOXYzine 25 MG capsule Commonly known as: VISTARIL Take 1 capsule (25 mg total) by mouth 2 (two) times daily as needed for anxiety.   ipratropium-albuterol 0.5-2.5 (3) MG/3ML Soln Commonly known as: DUONEB Take 3 mLs by nebulization every 12 (twelve) hours as needed (shortness of breath, wheezing).   loratadine 10 MG tablet Commonly known as: CLARITIN TAKE 1 TABLET(10 MG) BY MOUTH DAILY What changed: See the new instructions.   Melatonin 3 MG Tabs Take 1 tablet (3 mg total) by mouth at bedtime as needed (sleep). What changed: when to take this   metoprolol tartrate 25 MG tablet Commonly known as: LOPRESSOR Take 0.5 tablets (12.5 mg  total) by mouth 2 (two) times daily.   mometasone-formoterol 100-5 MCG/ACT Aero Commonly known as: DULERA Inhale 2 puffs into the lungs 2 (two) times daily.   montelukast 10 MG tablet Commonly known as: SINGULAIR Take 1 tablet (10 mg total) by mouth at bedtime.   omeprazole 20 MG capsule Commonly known as: PRILOSEC Take 1 capsule (20 mg total) by mouth daily.   OXYGEN Inhale 7 L into the lungs continuous.   polyethylene glycol 17 g packet Commonly known as: MIRALAX / GLYCOLAX Take 17 g by mouth daily as needed for mild constipation.   potassium chloride SA 20 MEQ tablet Commonly known as: KLOR-CON Take 2 tablets (40 mEq total) by mouth 2 (two) times daily.   topiramate 50 MG tablet Commonly known as: TOPAMAX Take 1 tablet (50 mg total) by mouth daily.   umeclidinium bromide 62.5 MCG/INH Aepb Commonly known as: INCRUSE ELLIPTA Inhale 1 puff into the lungs daily.   venlafaxine 37.5 MG tablet Commonly known as: EFFEXOR Take 37.5 mg by mouth daily with breakfast. Take with a 150 mg ER capsule every morning What changed: Another medication with the same name was changed. Make sure you understand how and when to take each.   venlafaxine XR 150 MG 24 hr capsule Commonly known as: EFFEXOR-XR Take 1 capsule (150 mg total) by mouth daily with breakfast. Take 150mg  capsule with 37.5mg  tablet What changed: additional instructions       Discharge Instructions: Please refer to Patient Instructions section of EMR for full details.  Patient was counseled important signs and symptoms that should prompt return to medical care, changes in medications, dietary instructions, activity restrictions, and follow up appointments.   Follow-Up Appointments: Follow-up Information    [facility].  Specialty: [place], [place] 08/12/2019, 8:01 AM PGY-1, [place]

## 2019-08-11 NOTE — Discharge Summary (Deleted)
[facility] Discharge Summary  Patient name: [name] White Medical record number: [id number] Date of birth: Oct 30, 1955 Age: 64 y.o. Gender: female Date of Admission: 08/09/2019  Date of Discharge: 08/11/2019 Admitting Physician: Martyn Malay, MD  Primary Care Provider: [name], MD Consultants: None  Indication for Hospitalization: Acute hypoxic respiratory failure  Discharge Diagnoses/Problem List:  Active Problems:   Acute hypoxemic respiratory failure [facility])  Disposition: Home  Discharge Condition: Stable  Discharge Exam:   General: Alert and oriented in no apparent distress Heart: Regular rate and rhythm with no murmurs appreciated Lungs: Fine crackles noted in left base vs right, satting well on home O2 dosing (6L), no respiratory distress Abdomen: Bowel sounds present, no abdominal pain Skin: Warm and dry  Brief Hospital Course:  Patient admitted on 2/12 with increased cough, increased oxygen requirement and increased work of breathing.  Chest x-ray showed signs of early left lower lobe pneumonia.  Patient was initially placed on BiPAP and started on ceftriaxone and doxycycline.  Patient did well on BiPAP throughout the day and overnight and was transitioned to nasal cannula the following morning.  On the day of discharge patient endorsed she was back at her baseline, and was breathing on 6 L nasal cannula which was her baseline oxygen requirement.  On the day of discharge she was seen by physical therapy who recommended home health PT. Patient was discharged home with remaining antibiotics to complete a total of 7-day course  Hypokalemia Patient with potassium requirement of 85meq twice daily at home.  Initially given IV potassium while n.p.o. on BiPAP and transition to 40 twice daily during hospitalization. On day of discharge potassium level was 3.9.  Issues for Follow Up:  1. Patient with new diagnosis of diabetes, recommend initiate  medications 2. Patient with anemia that appears to be due to low iron, recommend outpatient follow-up 3. Consider sleep study 4. Recommend PCP recheck potassium level within one week of discharge 5. Patient discharged on home dose of Lasix 40 mg/day, states she is no longer taking 80 mg/day which was also listed as a home dose in addition to the 40mg /day dosing. 6. Recommended the patient stop home tramadol she states she has not taken this 7. Discharged on 5 additional days of doxycycline and cefdinir for CAP (7 days total).  Significant Procedures: None  Significant Labs and Imaging:  Recent Labs  Lab 08/09/19 0941 08/09/19 0941 08/09/19 1049 08/10/19 0335 08/11/19 0251  WBC 8.1  --   --  10.6* 10.3  HGB 9.6*   < > 10.9* 9.2* 9.2*  HCT 34.3*   < > 32.0* 32.7* 33.9*  PLT 688*  --   --  691* 712*   < > = values in this interval not displayed.   Recent Labs  Lab 08/09/19 0941 08/09/19 0941 08/09/19 1049 08/09/19 1049 08/09/19 1904 08/09/19 1904 08/10/19 0335 08/11/19 0251  NA 142  --  142  --  141  --  142 144  K 3.0*   < > 2.7*   < > 3.8   < > 3.4* 3.9  CL 100  --   --   --  101  --  104 103  CO2 30  --   --   --  28  --  29 31  GLUCOSE 152*  --   --   --  214*  --  132* 102*  BUN 8  --   --   --  10  --  8 16  CREATININE 0.69  --   --   --  0.63  --  0.59 0.64  CALCIUM 8.7*  --   --   --  8.6*  --  8.9 8.5*  ALKPHOS 76  --   --   --   --   --   --   --   AST 31  --   --   --   --   --   --   --   ALT 22  --   --   --   --   --   --   --   ALBUMIN 3.1*  --   --   --   --   --   --   --    < > = values in this interval not displayed.    DG Chest 2 View  Result Date: 08/10/2019 CLINICAL DATA:  Cough, shortness of breath and chest pain. EXAM: CHEST - 2 VIEW COMPARISON:  08/09/2019 FINDINGS: Aortic atherosclerosis noted. Mild cardiac enlargement. Asymmetric elevation of right hemidiaphragm. Opacity in the left base is again noted compatible with either pneumonia or  atelectasis. IMPRESSION: Persistent left base opacity which may represent pneumonia and or atelectasis. Electronically Signed   By: Kerby Moors M.D.   On: 08/10/2019 09:48   X-ray chest PA and lateral  Result Date: 07/21/2019 CLINICAL DATA:  Shortness of breath EXAM: CHEST - 2 VIEW COMPARISON:  07/21/2019 FINDINGS: Mild cardiomegaly with mild diffuse interstitial opacity. No pneumothorax or sizable pleural effusion. No focal airspace consolidation IMPRESSION: Mild cardiomegaly and mild diffuse interstitial opacity, which may indicate mild pulmonary edema. Electronically Signed   By: Ulyses Jarred M.D.   On: 07/21/2019 21:31   CT ANGIO CHEST PE W OR WO CONTRAST  Result Date: 07/21/2019 CLINICAL DATA:  Shortness of breath EXAM: CT ANGIOGRAPHY CHEST WITH CONTRAST TECHNIQUE: Multidetector CT imaging of the chest was performed using the standard protocol during bolus administration of intravenous contrast. Multiplanar CT image reconstructions and MIPs were obtained to evaluate the vascular anatomy. CONTRAST:  84mL OMNIPAQUE IOHEXOL 350 MG/ML SOLN COMPARISON:  06/21/2019 FINDINGS: Cardiovascular: No filling defects in the pulmonary arteries to suggest pulmonary emboli. Heart is borderline in size. Aorta normal caliber. Scattered aortic calcifications. Mediastinum/Nodes: No mediastinal, hilar, or axillary adenopathy. Trachea and esophagus are unremarkable. Thyroid unremarkable. Lungs/Pleura: Airspace disease noted in the lower lobes bilaterally as well as in the posterior upper lobes. Appearance is concerning for pneumonia. Aspiration cannot be excluded. Upper Abdomen: Imaging into the upper abdomen shows no acute findings. Musculoskeletal: Chest wall soft tissues are unremarkable. No acute bony abnormality. Review of the MIP images confirms the above findings. IMPRESSION: No evidence of pulmonary embolus. Bilateral lower lobe airspace opacities and dependent opacities in the upper lobes. Appearance is  concerning for pneumonia. Aspiration pneumonia cannot be excluded. Borderline cardiomegaly Aortic Atherosclerosis (ICD10-I70.0). Electronically Signed   By: Rolm Baptise M.D.   On: 07/21/2019 21:29   DG Chest Port 1 View  Result Date: 08/09/2019 CLINICAL DATA:  Shortness of breath with decreased oxygen saturation EXAM: PORTABLE CHEST 1 VIEW COMPARISON:  July 21, 2019 FINDINGS: There is stable elevation of the right hemidiaphragm. There is ill-defined opacity in the left base, concerning for a degree of infiltrate. Lungs elsewhere clear. Heart is upper normal in size with pulmonary vascularity normal. No adenopathy. There is aortic atherosclerosis. No bone lesions. IMPRESSION: Suspect early pneumonia left base. Lungs elsewhere clear. Stable cardiac silhouette. Stable elevation  of right hemidiaphragm. Aortic Atherosclerosis (ICD10-I70.0). Electronically Signed   By: Lowella Grip III M.D.   On: 08/09/2019 09:53   DG Chest Port 1 View  Result Date: 07/21/2019 CLINICAL DATA:  Respiratory distress EXAM: PORTABLE CHEST 1 VIEW COMPARISON:  06/21/2019 FINDINGS: Bilateral mild interstitial thickening. Mild bibasilar airspace disease. No pleural effusion or pneumothorax. Stable cardiomediastinal silhouette. No aggressive osseous lesion. IMPRESSION: Bibasilar airspace disease which may reflect atelectasis versus pneumonia. Electronically Signed   By: Kathreen Devoid   On: 07/21/2019 12:41   [place] LT LOWER EXTREM LTD SOFT TISSUE NON VASCULAR  Result Date: 07/23/2019 CLINICAL DATA:  Left thigh abscess EXAM: ULTRASOUND LEFT LOWER EXTREMITY LIMITED TECHNIQUE: Ultrasound examination of the lower extremity soft tissues was performed in the area of clinical concern. COMPARISON:  None. FINDINGS: Ultrasound was performed in the area of concern in the posterior upper left thigh. There is edema within the subcutaneous soft tissues. Small focal fluid collection is seen within the subcutaneous soft tissues which measures 2.8 x  2.4 x 0.6 cm and could reflect a small soft tissue abscess. IMPRESSION: Edema within the soft tissues in the area of concern with small focal fluid collection as above concerning for focal abscess. Electronically Signed   By: Rolm Baptise M.D.   On: 07/23/2019 20:44    Results/Tests Pending at Time of Discharge: None  Discharge Medications:  Allergies as of 08/11/2019      Reactions   Caffeine Other (See Comments)   Migraine   Hydralazine Hcl Other (See Comments)   Other reaction(s): Other (See Comments) AKI leading to rhabdo and electrolyte abnormalities   Hydrocodone Nausea And Vomiting   Headache   Ciprofloxacin Hives, Rash   Erythromycin Hives, Rash   Lisinopril Cough   Oxycodone Nausea And Vomiting   Headache   Sulfamethoxazole-trimethoprim Rash      Medication List    STOP taking these medications   traMADol 50 MG tablet Commonly known as: ULTRAM     TAKE these medications   acetaminophen 325 MG tablet Commonly known as: TYLENOL Take 2 tablets (650 mg total) by mouth every 6 (six) hours as needed for mild pain.   albuterol (2.5 MG/3ML) 0.083% nebulizer solution Commonly known as: PROVENTIL Take 3 mLs (2.5 mg total) by nebulization every 6 (six) hours as needed for wheezing or shortness of breath. What changed: Another medication with the same name was changed. Make sure you understand how and when to take each.   Ventolin HFA 108 (90 Base) MCG/ACT inhaler Generic drug: albuterol INHALE 2 PUFFS INTO THE LUNGS EVERY 6 HOURS AS NEEDED FOR WHEEZING OR SHORTNESS OF BREATH What changed: See the new instructions.   apixaban 5 MG Tabs tablet Commonly known as: ELIQUIS Take 1 tablet (5 mg total) by mouth 2 (two) times daily.   atorvastatin 40 MG tablet Commonly known as: LIPITOR Take 1 tablet (40 mg total) by mouth daily at 6 PM.   azelastine 0.05 % ophthalmic solution Commonly known as: OPTIVAR Place 1 drop into both eyes 2 (two) times daily as needed (itchy  eyes).   azelastine 0.1 % nasal spray Commonly known as: ASTELIN Place 2 sprays into both nostrils 2 (two) times daily. Use in each nostril as directed What changed:   when to take this  additional instructions   cefdinir 300 MG capsule Commonly known as: OMNICEF Take 1 capsule (300 mg total) by mouth 2 (two) times daily for 5 days.   cycloSPORINE 0.05 % ophthalmic emulsion Commonly  known as: RESTASIS Place 1 drop into both eyes 2 (two) times daily.   diclofenac sodium 1 % Gel Commonly known as: VOLTAREN APPLY 2 GRAMS EXTERNALLY TO THE AFFECTED AREA FOUR TIMES DAILY What changed: See the new instructions.   diltiazem 120 MG 24 hr capsule Commonly known as: CARDIZEM CD Take 1 capsule (120 mg total) by mouth daily. What changed: when to take this   doxycycline 100 MG tablet Commonly known as: VIBRA-TABS Take 1 tablet (100 mg total) by mouth every 12 (twelve) hours for 5 days.   famotidine 20 MG tablet Commonly known as: PEPCID Take 1 tablet (20 mg total) by mouth daily.   ferrous sulfate 325 (65 FE) MG tablet Take 1 tablet (325 mg total) by mouth daily with breakfast. Start taking on: August 12, 2019   flecainide 100 MG tablet Commonly known as: TAMBOCOR TAKE 1 TABLET BY MOUTH 2 TIMES A DAY. PLEASE KEEP UPCOMING APPOINTMENT IN JANUARY BEFORE ANYMORE REFILLS. What changed: See the new instructions.   fluticasone 50 MCG/ACT nasal spray Commonly known as: FLONASE Place 2 sprays into both nostrils daily.   furosemide 40 MG tablet Commonly known as: LASIX Take 40 mg by mouth daily. What changed: Another medication with the same name was removed. Continue taking this medication, and follow the directions you see here.   gabapentin 100 MG capsule Commonly known as: NEURONTIN Take 2 capsules (200 mg total) by mouth 3 (three) times daily.   guaiFENesin-dextromethorphan 100-10 MG/5ML syrup Commonly known as: ROBITUSSIN DM Take 5 mLs by mouth every 4 (four) hours as  needed for cough.   hydrOXYzine 25 MG capsule Commonly known as: VISTARIL Take 1 capsule (25 mg total) by mouth 2 (two) times daily as needed for anxiety.   ipratropium-albuterol 0.5-2.5 (3) MG/3ML Soln Commonly known as: DUONEB Take 3 mLs by nebulization every 12 (twelve) hours as needed (shortness of breath, wheezing).   loratadine 10 MG tablet Commonly known as: CLARITIN TAKE 1 TABLET(10 MG) BY MOUTH DAILY What changed: See the new instructions.   Melatonin 3 MG Tabs Take 1 tablet (3 mg total) by mouth at bedtime as needed (sleep). What changed: when to take this   metoprolol tartrate 25 MG tablet Commonly known as: LOPRESSOR Take 0.5 tablets (12.5 mg total) by mouth 2 (two) times daily.   mometasone-formoterol 100-5 MCG/ACT Aero Commonly known as: DULERA Inhale 2 puffs into the lungs 2 (two) times daily.   montelukast 10 MG tablet Commonly known as: SINGULAIR Take 1 tablet (10 mg total) by mouth at bedtime.   omeprazole 20 MG capsule Commonly known as: PRILOSEC Take 1 capsule (20 mg total) by mouth daily.   OXYGEN Inhale 7 L into the lungs continuous.   polyethylene glycol 17 g packet Commonly known as: MIRALAX / GLYCOLAX Take 17 g by mouth daily as needed for mild constipation.   potassium chloride SA 20 MEQ tablet Commonly known as: KLOR-CON Take 2 tablets (40 mEq total) by mouth 2 (two) times daily.   topiramate 50 MG tablet Commonly known as: TOPAMAX Take 1 tablet (50 mg total) by mouth daily.   umeclidinium bromide 62.5 MCG/INH Aepb Commonly known as: INCRUSE ELLIPTA Inhale 1 puff into the lungs daily.   venlafaxine 37.5 MG tablet Commonly known as: EFFEXOR Take 37.5 mg by mouth daily with breakfast. Take with a 150 mg ER capsule every morning What changed: Another medication with the same name was changed. Make sure you understand how and when to take each.  venlafaxine XR 150 MG 24 hr capsule Commonly known as: EFFEXOR-XR Take 1 capsule (150 mg  total) by mouth daily with breakfast. Take 150mg  capsule with 37.5mg  tablet What changed: additional instructions       Discharge Instructions: Please refer to Patient Instructions section of EMR for full details.  Patient was counseled important signs and symptoms that should prompt return to medical care, changes in medications, dietary instructions, activity restrictions, and follow up appointments.   Follow-Up Appointments: Follow-up Information    [facility].   Specialty: [place], [place] 08/11/2019, 3:22 PM PGY-1, [place]

## 2019-08-12 ENCOUNTER — Other Ambulatory Visit: Payer: Self-pay | Admitting: *Deleted

## 2019-08-12 DIAGNOSIS — Z7401 Bed confinement status: Secondary | ICD-10-CM | POA: Diagnosis not present

## 2019-08-12 DIAGNOSIS — R5381 Other malaise: Secondary | ICD-10-CM | POA: Diagnosis not present

## 2019-08-12 DIAGNOSIS — M2550 Pain in unspecified joint: Secondary | ICD-10-CM | POA: Diagnosis not present

## 2019-08-12 LAB — GLUCOSE, CAPILLARY
Glucose-Capillary: 106 mg/dL — ABNORMAL HIGH (ref 70–99)
Glucose-Capillary: 104 mg/dL — ABNORMAL HIGH (ref 70–99)
Glucose-Capillary: 105 mg/dL — ABNORMAL HIGH (ref 70–99)
Glucose-Capillary: 129 mg/dL — ABNORMAL HIGH (ref 70–99)

## 2019-08-12 LAB — LEGIONELLA PNEUMOPHILA SEROGP 1 UR AG: L. pneumophila Serogp 1 Ur Ag: NEGATIVE

## 2019-08-12 MED ORDER — MONTELUKAST SODIUM 10 MG PO TABS: 10.0000 mg | ORAL_TABLET | Freq: Every day | ORAL | 0 refills | Status: DC

## 2019-08-12 NOTE — TOC Transition Note (Signed)
Transition of Care [facility]) - CM/SW Discharge Note   Patient Details  Name: [name] White MRN: [id number] Date of Birth: Oct 24, 1955  Transition of Care [facility]) CM/SW Contact:  Pollie Friar, RN Phone Number: 08/12/2019, 1:12 PM   Clinical Narrative:    Pt discharging home with resumption of [place] services through [facility]. Butch Penny with [facility] aware of d/c home today.  Pt to transport home via PTAR. CM spoke to patient and niece. Niece has MD appt at 4 pm and wont be at the home. Transport set for 5:30 with bedside RN calling niece prior to transport to make sure she is home. Bedside RN updated and in agreement.  D/c packet in the chart.   Final next level of care: Home w Home Health Services Barriers to Discharge: No Barriers Identified   Patient Goals and CMS Choice   CMS Medicare.gov Compare Post Acute Care list provided to:: Patient Choice offered to / list presented to : Patient  Discharge Placement                       Discharge Plan and Services                                     Social Determinants of Health (SDOH) Interventions     Readmission Risk Interventions Readmission Risk Prevention Plan 07/22/2019 03/08/2019  Transportation Screening Complete Complete  Medication Review Press photographer) Complete Complete  PCP or Specialist appointment within 3-5 days of discharge Complete Complete  HRI or [place] Complete Complete  SW Recovery Care/Counseling Consult Complete Complete  Palliative Care Screening Not Applicable Not [place] Not Applicable Not Applicable  Some recent data might be hidden

## 2019-08-12 NOTE — Progress Notes (Addendum)
Family Medicine Teaching Service Daily Progress Note Intern Pager: [phone]  Patient name: [name] White Medical record number: [id number] Date of birth: 10/28/1955 Age: 64 y.o. Gender: female  Primary Care Provider: [name], MD Consultants: n/a Code Status: BNR  Pt Overview and Major Events to Date:  2/12 admitted  Assessment and Plan: [name] White is a 64 y.o. female presenting with shortness of breath. PMH is significant for vocal cord dysfunction status post partial thyroidectomy, COPD/asthma, hypertension, history of PE, A. fib on Eliquis, anxiety, depression, chronic back pain, GERD, falls.  Acute hypoxic respiratory failure-stable, due to CAP Improved.  Continue on BiPAP at night, home home oxygen requirement of 6 L during day.  Plan for discharge home today.  Left lower lobe pneumonia Patient on ceftriaxone and doxycycline.  Patient afebrile since admission.  Continue antibiotics for total 7-day course. -Continue cefdinir/doxycycline -Monitor fever curve  Anemia-stable Hemoglobin stable from 2/14 at 9.2.  Patient with reduced ferritin during hospitalization in January. -Daily CBC -Start iron supplementation. -Recommend outpatient follow-up  HFpEF- stable likely non-contirbutory BNP on admission 40.1.  Most recent ECHO 12/02, LVEF 60-65%, normal LV function, severe increased LV hypertrophy.  Home medication Lasix 80 mg daily. Chest xray w/o pulmonary edema.  On exam has trace pitting edema in the left leg.  If elevated BNP, consider IV lasix. Overall, does not appear to be in heart failure.  -Continue home medications once not n.p.o. on BiPAP -daily weights -strict intake and output  Chest pressure -resolved  OSA/OHS Patient with trilogy BiPAP at home. -BiPAP as needed -BiPAP at night -Consider sleep study outpatient  Diabetes-stable Patient with recent diagnosis of diabetes during admission this past December.  HbA1C at that time of 7.8.     Current glucose 105.  During previous hospitalization it was thought that patient's 7.8 A1c was related to her numerous steroid treatments and she was not discharged home on any diabetic medications. -Sensitive sliding scale -monitor CBG -added qhs coverage -consider meds on d/c  History of pulmonary embolism- Less likely contribution to symptoms and already on eliquis Patient with a history of pulmonary embolism with chronic swelling of the left lower limb.  Home medication Eliquis.  -Continue Eliquis 5mg  BID  Paroxysmal AFIB- not in RVR Home medication Diltiazem120 mg qhs, Metoprolol 12.5 mg BID, Flecanide 100 mg BID, Eliquis.  Per prior notes patient may have a propensity to occasionally flip into A. fib which could increase her chest pressure/anxiety sensations. -Continue home medications  History of HTN-stable  Home medication, Metopriolol, Cardizem, Lasix -Continue home medication   Morbid Obesity-stable Current weight of  164.4kg, BMI 60.31.  This weight seems to be approximately the same as her last hospitalization with weights at that time from 164-169 kg.   -Would not be an optimal candidate for bariatric surgery but may consider referral on outpatient  -encourage healthy lifestyle and weight management  Vocal Cord Dysfunction  Recent PFT's show no evidence of COPD/Asthma. Possibly vocal cord dysfunction, on home oxygen of 6L.  Followed by Dr.Byrum, Pulmonology.  Missed last appointment.  Home medications Montelukast, Albuterol, Duo-nebs, Dulera. Was followed by Pulmonology, but medications were not discontinued. Recommend avoiding systemic corticosteroids as this may worsen vocal cord dysfunction. -continue home medications  -monitor resp status   MDD/Anxiety-stable Patient with a history of anxiety. Her home medications include Atarax 25 mg, Effexor 187.5 mg daily  -Continue home medications  GERD Home meds Prilosec 20 mg daily and Pepcid 20 mg.  -continue home  meds   FEN/GI: Heart healthy/carb modified, n.p.o. while on BiPAP Prophylaxis: Eliquis  Disposition: to home when stabilized   Subjective:  Patient states she is breathing well this morning.  Plan for discharge home later today.  Objective: Temp:  [97.5 F (36.4 C)-98.3 F (36.8 C)] 98.3 F (36.8 C) (02/15 1300) Pulse Rate:  [59-98] 77 (02/15 1300) Resp:  [14-20] 18 (02/15 1300) BP: (99-123)/(62-79) 109/72 (02/15 1300) SpO2:  [97 %-100 %] 98 % (02/15 1300) FiO2 (%):  [40 %] 40 % (02/15 1300) Weight:  [164.4 kg] 164.4 kg (02/15 0417) Physical Exam: General: Alert and oriented in no apparent distress Heart: Regular rate and rhythm with no murmurs appreciated Lungs: CTA bilaterally, breathing well on home dose of oxygen (5 L). Abdomen: Bowel sounds present, no abdominal pain Skin: Warm and dry  Laboratory: Recent Labs  Lab 08/09/19 0941 08/09/19 0941 08/09/19 1049 08/10/19 0335 08/11/19 0251  WBC 8.1  --   --  10.6* 10.3  HGB 9.6*   < > 10.9* 9.2* 9.2*  HCT 34.3*   < > 32.0* 32.7* 33.9*  PLT 688*  --   --  691* 712*   < > = values in this interval not displayed.   Recent Labs  Lab 08/09/19 0941 08/09/19 1049 08/09/19 1904 08/10/19 0335 08/11/19 0251  NA 142   < > 141 142 144  K 3.0*   < > 3.8 3.4* 3.9  CL 100   < > 101 104 103  CO2 30   < > 28 29 31   BUN 8   < > 10 8 16   CREATININE 0.69   < > 0.63 0.59 0.64  CALCIUM 8.7*   < > 8.6* 8.9 8.5*  PROT 7.1  --   --   --   --   BILITOT 0.4  --   --   --   --   ALKPHOS 76  --   --   --   --   ALT 22  --   --   --   --   AST 31  --   --   --   --   GLUCOSE 152*   < > 214* 132* 102*   < > = values in this interval not displayed.    Lactic acid improved to 1.1 from 3.2  Imaging/Diagnostic Tests: DG Chest Port 1 View  Result Date: 08/09/2019 CLINICAL DATA:  Shortness of breath with decreased oxygen saturation EXAM: PORTABLE CHEST 1 VIEW COMPARISON:  July 21, 2019 FINDINGS: There is stable elevation of the  right hemidiaphragm. There is ill-defined opacity in the left base, concerning for a degree of infiltrate. Lungs elsewhere clear. Heart is upper normal in size with pulmonary vascularity normal. No adenopathy. There is aortic atherosclerosis. No bone lesions. IMPRESSION: Suspect early pneumonia left base. Lungs elsewhere clear. Stable cardiac silhouette. Stable elevation of right hemidiaphragm. Aortic Atherosclerosis (ICD10-I70.0). Electronically Signed   By: Lowella Grip III M.D.   On: 08/09/2019 09:53     Lurline Del, DO 08/12/2019, 1:38 PM PGY-1, [place] Intern pager: [phone], text pages welcome

## 2019-08-12 NOTE — Progress Notes (Signed)
PIV removed. AVS discussed with patient and niece, all questions and concerns answered. Discharging home with home PT. Awaiting transport to home by PTAR. All belongings packed and ready for discharge. Packet at bedside.

## 2019-08-12 NOTE — Consult Note (Signed)
   [facility] CM Inpatient Consult   08/12/2019  NAUTICA FAMIGLIETTI 1956/05/09 [id number]   Patient on the extreme high risk list for unplanned readmission scores.  Patient is active in the [facility] for complex care management at the [facility].    Plan: Will alert the [place] of patient's hospitalization.  This provider is listed to perform the Transition of Care follow up.  Patient admitted with Hypoxic respiratory failure which includes but not limited to a HX COPD.  Natividad Brood, RN BSN [facility] Liaison  [phone] business mobile phone Toll free office [phone]  Fax number: [phone] [email or web address]

## 2019-08-12 NOTE — Progress Notes (Signed)
Physical Therapy Treatment Patient Details Name: [name] White MRN: [id number] DOB: [birth year] Today's Date: 08/12/2019    History of Present Illness 64 y.o. female presenting with shortness of breath. PMH is significant for vocal cord dysfunction status post partial thyroidectomy, COPD/asthma, hypertension, history of PE, A. fib on Eliquis, anxiety, depression, chronic back pain, GERD, falls    PT Comments    Patient seen for mobility progression. Pt tolerated session well on 6L O2 via [place]. Current plan remains appropriate.    Follow Up Recommendations  Home health PT;Supervision for mobility/OOB     Equipment Recommendations  None recommended by PT(pt owns necessary DME)    Recommendations for Other Services       Precautions / Restrictions Precautions Precautions: Fall Precaution Comments: watch o2 sats Restrictions Weight Bearing Restrictions: No    Mobility  Bed Mobility Overal bed mobility: Modified Independent Bed Mobility: Supine to Sit     Supine to sit: HOB elevated     General bed mobility comments: use of rail  Transfers Overall transfer level: Needs assistance Equipment used: Rolling walker (2 wheeled) Transfers: Sit to/from [facility] Sit to Stand: Supervision         General transfer comment: pt stood from EOB and recliner with supervision for safety  Ambulation/Gait Ambulation/Gait assistance: Supervision Gait Distance (Feet): 6 Feet Assistive device: Rolling walker (2 wheeled) Gait Pattern/deviations: Step-through pattern;Trunk flexed;Wide base of support;Decreased step length - right;Decreased step length - left Gait velocity: dec   General Gait Details: supervision for safety; pt SOB but recovered quickly once in sitting    Stairs             Wheelchair Mobility    Modified Rankin (Stroke Patients Only)       Balance Overall balance assessment: Needs assistance Sitting-balance support: No upper  extremity supported;Feet supported Sitting balance-Leahy Scale: Good     Standing balance support: Bilateral upper extremity supported Standing balance-Leahy Scale: Poor                              Cognition Arousal/Alertness: Awake/alert Behavior During Therapy: WFL for tasks assessed/performed Overall Cognitive Status: Within Functional Limits for tasks assessed                                        Exercises Other Exercises Other Exercises: sit to stands X 5    General Comments General comments (skin integrity, edema, etc.): Pt on 6L O2 via [place] throughout session; SpO2 90% or > with mobility       Pertinent Vitals/Pain Pain Assessment: No/denies pain    Home Living                      Prior Function            PT Goals (current goals can now be found in the care plan section) Progress towards PT goals: Progressing toward goals    Frequency    Min 3X/week      PT Plan Current plan remains appropriate    Co-evaluation              AM-PAC PT "6 Clicks" Mobility   Outcome Measure  Help needed turning from your back to your side while in a flat bed without using bedrails?: A Little Help needed moving from lying  on your back to sitting on the side of a flat bed without using bedrails?: A Little Help needed moving to and from a bed to a chair (including a wheelchair)?: None Help needed standing up from a chair using your arms (e.g., wheelchair or bedside chair)?: None Help needed to walk in hospital room?: A Little Help needed climbing 3-5 steps with a railing? : Total 6 Click Score: 18    End of Session Equipment Utilized During Treatment: Oxygen Activity Tolerance: Patient tolerated treatment well Patient left: with call bell/phone within reach;in chair Nurse Communication: Mobility status PT Visit Diagnosis: Muscle weakness (generalized) (M62.81);Other (comment)(cardiopulmonary endurance deficits)     Time:  1515-1550 PT Time Calculation (min) (ACUTE ONLY): 35 min  Charges:  $Gait Training: 23-37 mins                     Earney Navy, PTA Acute Rehabilitation Services Pager: [phone] Office: [phone]     Darliss Cheney 08/12/2019, 4:51 PM

## 2019-08-13 ENCOUNTER — Ambulatory Visit: Payer: Medicare HMO

## 2019-08-13 ENCOUNTER — Telehealth: Payer: Self-pay | Admitting: Emergency Medicine

## 2019-08-13 ENCOUNTER — Other Ambulatory Visit: Payer: Self-pay

## 2019-08-13 NOTE — Telephone Encounter (Signed)
Needs F/U appt with Dr. Lamonte Sakai per Dr. Loanne Drilling

## 2019-08-13 NOTE — Chronic Care Management (AMB) (Signed)
  Care Management   Outreach Note  08/13/2019 Name: [name] White MRN: [id number] DOB: 05/14/1956  Referred by: [name], MD Reason for referral : Care Coordination (Care Management [facility] F/U Initial)   An unsuccessful telephone outreach was attempted today. The patient was referred to the case management team for assistance with care management and care coordination.   Follow Up Plan: A HIPPA compliant phone message was left for the patient providing contact information and requesting a return call.  The care management team will reach out to the patient again over the next 3 days.     Lazaro Arms RN, BSN, [facility] Care Management Coordinator [place] Phone: [phone] Fax: [phone]

## 2019-08-14 ENCOUNTER — Telehealth (INDEPENDENT_AMBULATORY_CARE_PROVIDER_SITE_OTHER): Payer: Medicare HMO | Admitting: Family Medicine

## 2019-08-14 ENCOUNTER — Other Ambulatory Visit: Payer: Self-pay

## 2019-08-14 DIAGNOSIS — Z5329 Procedure and treatment not carried out because of patient's decision for other reasons: Secondary | ICD-10-CM

## 2019-08-14 LAB — CULTURE, BLOOD (ROUTINE X 2)
Culture: NO GROWTH
Culture: NO GROWTH
Special Requests: ADEQUATE

## 2019-08-14 NOTE — Progress Notes (Signed)
Attempted to call patient x2. Phone rang several times initially with no answer. On second attempt phone went straight to voicemail.  [name] MD PGY-3 Family Medicine Resident

## 2019-08-16 ENCOUNTER — Ambulatory Visit: Payer: Self-pay

## 2019-08-16 ENCOUNTER — Other Ambulatory Visit: Payer: Self-pay

## 2019-08-16 ENCOUNTER — Ambulatory Visit: Payer: Medicare HMO

## 2019-08-16 NOTE — Chronic Care Management (AMB) (Signed)
  Care Management     Care Management Outreach Note  08/16/2019 Name: [name] MRN: [id number] DOB: Feb 22, 1956  [name] White is a 64 y.o. year old female who is a primary care patient of [name], MD . Patient called stateing that she has an appointment to have blood work on Monday and an appointment 2/25 with the Lipid clinic and  she was unsure who scheduled the appointment for her. The patient stated that she no longer has insurance but has applied for the orange card and wanted to know would she be charged for the visit.  RN Case Manager made some calls and found out the her paper work is still in process and she would need to call prior to going into the appointment to make arrangements.  She could also talk with Pharmacy at the clinic to see who may have made the appointment for her.  The patient thanked me for the information.    Lazaro Arms RN, BSN, [facility] Care Management Coordinator [place] Phone: [phone] Fax: [phone]

## 2019-08-16 NOTE — Patient Instructions (Signed)
Visit Information  Goals Addressed            This Visit's Progress   . " I have not seen Home health " (pt-stated)       CARE PLAN ENTRY (see longtitudinal plan of care for additional care plan information)  Current Barriers:  Marland Kitchen Knowledge Deficits related to post discharge instructions following IP event 08/09/19 to 08/12/19 . Chronic Disease Management support and education needs related to post discharge instructions, medication review, MD follow up and PT  . Nurse Case Manager Clinical Goal(s):  Marland Kitchen Over the next 14 days, patient will verbalize understanding of plan for post discharge instructions and HH PT  Interventions:  . Evaluation of current treatment plan related to post discharge care and patient's adherence to plan as established by provider. . Advised patient to keep phone charged and be available for when the doctor calls for appointment since she does not want to schedule appointment with no other provider  . Reviewed medications with patient and discussed importance of taking medication.   Patient and [name] states that she has all of her meds. Using oxygen.  [name] stated that she is using her oxygen.  Patient states that it is hard for her to talk on the phone and she prefers that her niece talk for her.  She knows to call the office for any questions or concerns Call 911 for severe shortness of breath  fever chills feeling confused chest pain  . Collaborated with Mort Sawyers at [facility] for [facility] regarding appt.  For physical therapy. She stated that they had been in touch with [name] and they declined start of care for 2/19 and 2/20 and would like for start of care to begin on 2/20. . Collaborate with [facility] liaison to get other numbers to reach the patient for follow up.  Patient [phone] [name] [phone] . Reviewed scheduled/upcoming provider appointments including: Patient had an appointment on 2/17 but states that her phone was not charged.   Patient has an appointment with Dr Kris Mouton on 08/27/19 at 3:50 offered the patient visit sooner with other physicians but she declined and stated she only wanted to see Dr. Kris Mouton.  Patient Self Care Activities:  . Calls provider office for new concerns or questions . Unable to perform ADLs independently . Unable to perform IADLs independently . Does not maintain contact with provider office  Initial goal documentation        Ms. Tejada was given information about Care Management services today including:  1. Care Management services include personalized support from designated clinical staff supervised by her physician, including individualized plan of care and coordination with other care providers 2. 24/7 contact phone numbers for assistance for urgent and routine care needs. 3. The patient may stop CCM services at any time (effective at the end of the month) by phone call to the office staff.  Patient agreed to services and verbal consent obtained.   The patient verbalized understanding of instructions provided today and declined a print copy of patient instruction materials.   The care management team will reach out to the patient again over the next 14 days.  The patient has been provided with contact information for the care management team and has been advised to call with any health related questions or concerns.   Lazaro Arms RN, BSN, [facility] Care Management Coordinator [place] Phone: [phone] Fax: [phone]

## 2019-08-16 NOTE — Chronic Care Management (AMB) (Signed)
Care Management   Initial Visit Note  08/16/2019 Name: [name] White MRN: [id number] DOB: October 12, 1955   Assessment: [name] White is a 64 y.o. year old female who sees [name], MD for primary care. The care management team was consulted for assistance with care management and care coordination needs related to Disease Management Educational Needs.   Review of patient status, including review of consultants reports, relevant laboratory and other test results, and collaboration with appropriate care team members and the patient's provider was performed as part of comprehensive patient evaluation and provision of care management services.    SDOH (Social Determinants of Health) assessments performed: No    Outpatient Encounter Medications as of 08/16/2019  Medication Sig Note  . atorvastatin (LIPITOR) 40 MG tablet Take 1 tablet (40 mg total) by mouth daily at 6 PM.   . azelastine (ASTELIN) 0.1 % nasal spray Place 2 sprays into both nostrils 2 (two) times daily. Use in each nostril as directed (Patient taking differently: Place 2 sprays into both nostrils daily. ) 06/15/2019: Pt uses both Astelin and flonase every morning  . azelastine (OPTIVAR) 0.05 % ophthalmic solution Place 1 drop into both eyes 2 (two) times daily as needed (itchy eyes).   . cefdinir (OMNICEF) 300 MG capsule Take 1 capsule (300 mg total) by mouth 2 (two) times daily for 5 days.   . cycloSPORINE (RESTASIS) 0.05 % ophthalmic emulsion Place 1 drop into both eyes 2 (two) times daily.   . diclofenac sodium (VOLTAREN) 1 % GEL APPLY 2 GRAMS EXTERNALLY TO THE AFFECTED AREA FOUR TIMES DAILY (Patient taking differently: Apply 2 g topically 4 (four) times daily as needed (arthritis pain - knees, shoulder and thighs). )   . diltiazem (CARDIZEM CD) 120 MG 24 hr capsule Take 1 capsule (120 mg total) by mouth daily. (Patient taking differently: Take 120 mg by mouth at bedtime. )   . doxycycline (VIBRA-TABS) 100 MG tablet Take 1  tablet (100 mg total) by mouth every 12 (twelve) hours for 5 days.   . famotidine (PEPCID) 20 MG tablet Take 1 tablet (20 mg total) by mouth daily.   . ferrous sulfate 325 (65 FE) MG tablet Take 1 tablet (325 mg total) by mouth daily with breakfast.   . flecainide (TAMBOCOR) 100 MG tablet TAKE 1 TABLET BY MOUTH 2 TIMES A DAY. PLEASE KEEP UPCOMING APPOINTMENT IN JANUARY BEFORE ANYMORE REFILLS. (Patient taking differently: Take 100 mg by mouth 2 (two) times daily. )   . fluticasone (FLONASE) 50 MCG/ACT nasal spray Place 2 sprays into both nostrils daily.  06/15/2019: Pt uses both Astelin and Flonase every morning  . furosemide (LASIX) 40 MG tablet Take 40 mg by mouth daily.   Marland Kitchen gabapentin (NEURONTIN) 100 MG capsule Take 2 capsules (200 mg total) by mouth 3 (three) times daily.   Marland Kitchen guaiFENesin-dextromethorphan (ROBITUSSIN DM) 100-10 MG/5ML syrup Take 5 mLs by mouth every 4 (four) hours as needed for cough.   . hydrOXYzine (VISTARIL) 25 MG capsule Take 1 capsule (25 mg total) by mouth 2 (two) times daily as needed for anxiety.   Marland Kitchen ipratropium-albuterol (DUONEB) 0.5-2.5 (3) MG/3ML SOLN Take 3 mLs by nebulization every 12 (twelve) hours as needed (shortness of breath, wheezing).   Marland Kitchen loratadine (CLARITIN) 10 MG tablet TAKE 1 TABLET(10 MG) BY MOUTH DAILY (Patient taking differently: Take 10 mg by mouth daily. )   . Melatonin 3 MG TABS Take 1 tablet (3 mg total) by mouth at bedtime as needed (sleep). (  Patient taking differently: Take 3 mg by mouth at bedtime. )   . metoprolol tartrate (LOPRESSOR) 25 MG tablet Take 0.5 tablets (12.5 mg total) by mouth 2 (two) times daily.   . mometasone-formoterol (DULERA) 100-5 MCG/ACT AERO Inhale 2 puffs into the lungs 2 (two) times daily.   . montelukast (SINGULAIR) 10 MG tablet Take 1 tablet (10 mg total) by mouth at bedtime.   Marland Kitchen omeprazole (PRILOSEC) 20 MG capsule Take 1 capsule (20 mg total) by mouth daily.   . OXYGEN Inhale 7 L into the lungs continuous.    . potassium  chloride SA (KLOR-CON) 20 MEQ tablet Take 2 tablets (40 mEq total) by mouth 2 (two) times daily.   Marland Kitchen topiramate (TOPAMAX) 50 MG tablet Take 1 tablet (50 mg total) by mouth daily.   Marland Kitchen umeclidinium bromide (INCRUSE ELLIPTA) 62.5 MCG/INH AEPB Inhale 1 puff into the lungs daily.   Marland Kitchen venlafaxine (EFFEXOR) 37.5 MG tablet Take 37.5 mg by mouth daily with breakfast. Take with a 150 mg ER capsule every morning   . venlafaxine XR (EFFEXOR-XR) 150 MG 24 hr capsule Take 1 capsule (150 mg total) by mouth daily with breakfast. Take 150mg  capsule with 37.5mg  tablet (Patient taking differently: Take 150 mg by mouth daily with breakfast. Take with a 37.5 mg tablet every morning)   . VENTOLIN HFA 108 (90 Base) MCG/ACT inhaler INHALE 2 PUFFS INTO THE LUNGS EVERY 6 HOURS AS NEEDED FOR WHEEZING OR SHORTNESS OF BREATH (Patient taking differently: Inhale 2 puffs into the lungs every 6 (six) hours as needed for wheezing or shortness of breath. )   . acetaminophen (TYLENOL) 325 MG tablet Take 2 tablets (650 mg total) by mouth every 6 (six) hours as needed for mild pain.   Marland Kitchen albuterol (PROVENTIL) (2.5 MG/3ML) 0.083% nebulizer solution Take 3 mLs (2.5 mg total) by nebulization every 6 (six) hours as needed for wheezing or shortness of breath. (Patient not taking: Reported on 06/15/2019) 06/15/2019: Pt is only using Duoneb at home now  . apixaban (ELIQUIS) 5 MG TABS tablet Take 1 tablet (5 mg total) by mouth 2 (two) times daily.   . polyethylene glycol (MIRALAX / GLYCOLAX) 17 g packet Take 17 g by mouth daily as needed for mild constipation. (Patient not taking: Reported on 08/09/2019) 08/09/2019: Pt is drinking prune juice instead of taking miralax   No facility-administered encounter medications on file as of 08/16/2019.    Goals Addressed            This Visit's Progress   . " I have not seen Home health " (pt-stated)       CARE PLAN ENTRY (see longtitudinal plan of care for additional care plan information)  Current  Barriers:  Marland Kitchen Knowledge Deficits related to post discharge instructions following IP event 08/09/19 to 08/12/19 . Chronic Disease Management support and education needs related to post discharge instructions, medication review, MD follow up and PT  . Nurse Case Manager Clinical Goal(s):  Marland Kitchen Over the next 14 days, patient will verbalize understanding of plan for post discharge instructions and HH PT  Interventions:  . Evaluation of current treatment plan related to post discharge care and patient's adherence to plan as established by provider. . Advised patient to keep phone charged and be available for when the doctor calls for appointment since she does not want to schedule appointment with no other provider  . Reviewed medications with patient and discussed importance of taking medication.   Patient and [name] White states  that she has all of her meds. Using oxygen.  [name] White stated that she is using her oxygen.  Patient states that it is hard for her to talk on the phone and she prefers that her niece talk for her.  She knows to call the office for any questions or concerns Call 911 for severe shortness of breath  fever chills feeling confused chest pain  . Collaborated with Mort Sawyers at [facility] regarding appt.  For physical therapy. She stated that they had been in touch with [name] White and they declined start of care for 2/19 and 2/20 and would like for start of care to begin on 2/20. . Collaborate with [facility] liaison to get other numbers to reach the patient for follow up.  Patient [phone] [name] White [phone] . Reviewed scheduled/upcoming provider appointments including: Patient had an appointment on 2/17 but states that her phone was not charged.  Patient has an appointment with Dr Kris Mouton on 08/27/19 at 3:50 offered the patient visit sooner with other physicians but she declined and stated she only wanted to see Dr. Kris Mouton.  Patient Self Care Activities:  . Calls  provider office for new concerns or questions . Unable to perform ADLs independently . Unable to perform IADLs independently . Does not maintain contact with provider office  Initial goal documentation         Follow up plan:  The care management team will reach out to the patient again over the next 14 days.  The patient has been provided with contact information for the care management team and has been advised to call with any health related questions or concerns.   [name] White was given information about Care Management services today including:  1. Care Management services include personalized support from designated clinical staff supervised by a physician, including individualized plan of care and coordination with other care providers 2. 24/7 contact phone numbers for assistance for urgent and routine care needs. 3. The patient may stop Care Management services at any time (effective at the end of the month) by phone call to the office staff.  Patient agreed to services and verbal consent obtained.  Lazaro Arms RN, BSN, [facility] Care Management Coordinator [place] Phone: [phone] Fax: [phone]

## 2019-08-17 DIAGNOSIS — E662 Morbid (severe) obesity with alveolar hypoventilation: Secondary | ICD-10-CM | POA: Diagnosis not present

## 2019-08-17 DIAGNOSIS — J9610 Chronic respiratory failure, unspecified whether with hypoxia or hypercapnia: Secondary | ICD-10-CM | POA: Diagnosis not present

## 2019-08-22 DIAGNOSIS — J9611 Chronic respiratory failure with hypoxia: Secondary | ICD-10-CM | POA: Diagnosis not present

## 2019-08-22 DIAGNOSIS — E1159 Type 2 diabetes mellitus with other circulatory complications: Secondary | ICD-10-CM | POA: Diagnosis not present

## 2019-08-22 DIAGNOSIS — J449 Chronic obstructive pulmonary disease, unspecified: Secondary | ICD-10-CM | POA: Diagnosis not present

## 2019-08-22 DIAGNOSIS — J9601 Acute respiratory failure with hypoxia: Secondary | ICD-10-CM | POA: Diagnosis not present

## 2019-08-27 ENCOUNTER — Telehealth (INDEPENDENT_AMBULATORY_CARE_PROVIDER_SITE_OTHER): Payer: Medicare HMO | Admitting: Family Medicine

## 2019-08-27 ENCOUNTER — Other Ambulatory Visit: Payer: Self-pay

## 2019-08-27 DIAGNOSIS — J9621 Acute and chronic respiratory failure with hypoxia: Secondary | ICD-10-CM

## 2019-08-27 DIAGNOSIS — R5381 Other malaise: Secondary | ICD-10-CM

## 2019-08-27 NOTE — Progress Notes (Deleted)
Called pt to do pre-workup for virtual appt. Pt stated that she didn't know why she had this appt. Said that on her Deloris Ping it says the appt was for 3/25 with dr mcdiarmid. Checked her chart and she doesn't have an appt for the 25th. Spoke to nurse traci who scheduled the appt and she said the pt was aware of appt today. Pt refused to continue the call because she had someone on the other line. Salathiel Ferrara Kennon Holter, CMA

## 2019-08-28 DIAGNOSIS — J449 Chronic obstructive pulmonary disease, unspecified: Secondary | ICD-10-CM | POA: Diagnosis not present

## 2019-08-29 ENCOUNTER — Ambulatory Visit: Payer: Medicare HMO

## 2019-08-29 ENCOUNTER — Other Ambulatory Visit: Payer: Self-pay

## 2019-08-29 NOTE — Assessment & Plan Note (Signed)
Doing well from the standpoint.  Still on 7 L of oxygen which is what she was discharged on.  I did give instructions for weaning the oxygen as tolerated, with the goal to ultimately get her off the oxygen.

## 2019-08-29 NOTE — Progress Notes (Signed)
[facility] Telemedicine Visit  Patient consented to have virtual visit. Method of visit: Telephone  Encounter participants: Patient: [name] White - located at home Provider: [name] - located at [facility] Others (if applicable): None  Chief Complaint: Hospital follow-up  HPI: Patient was admitted on 2/12 with increased cough, oxygen requirement, increased work of breathing.  Was treated for a community-acquired pneumonia in her left lower lobe with ceftriaxone and doxycycline.  She was transitioned over to [place], and finished the dose of doxycycline.  She states that since hospital discharge she been doing quite well.  She is on 7 L of oxygen, was discharged on 6 or 7.  Has not tried to wean herself down.  She has been working with physical therapy and Occupational Therapy in order to get stronger, from debility secondary to numerous hospitalizations recently.  Overall she is doing quite well with no issues at this time.  Of note the patient needed her OT/PT plan reauthorized.  I found this document in my physical inbox and sent this in.  Also request incontinence supplies.  Not receive any request from [facility].  We will need to investigate this.  ROS: per HPI  Exam:  General: Very pleasant 64 year old African-American female, no acute distress Respiratory: No wheezing, could hear oxygen running in the background, no respiratory distress Psych: Very pleasant, coherent thought process  Assessment/Plan:  Acute on chronic respiratory failure with hypoxemia ([place]) Doing well from the standpoint.  Still on 7 L of oxygen which is what she was discharged on.  I did give instructions for weaning the oxygen as tolerated, with the goal to ultimately get her off the oxygen.  Debility Is improved greatly from the standpoint.  No able to walk around her house and do her ADLs which is a big improvement for last time.  Reauthorize PT/OT treatment plan   Time  spent during visit with patient: 23 minutes

## 2019-08-29 NOTE — Assessment & Plan Note (Signed)
Is improved greatly from the standpoint.  No able to walk around her house and do her ADLs which is a big improvement for last time.  Reauthorize PT/OT treatment plan

## 2019-08-31 ENCOUNTER — Emergency Department (HOSPITAL_COMMUNITY): Payer: Medicare HMO

## 2019-08-31 ENCOUNTER — Inpatient Hospital Stay (HOSPITAL_COMMUNITY)
Admission: EM | Admit: 2019-08-31 | Discharge: 2019-09-03 | DRG: 189 | Disposition: A | Discharge status: Home Health | Payer: Medicare HMO | Attending: Family Medicine | Admitting: Family Medicine

## 2019-08-31 ENCOUNTER — Inpatient Hospital Stay (HOSPITAL_COMMUNITY): Payer: Medicare HMO

## 2019-08-31 DIAGNOSIS — I5032 Chronic diastolic (congestive) heart failure: Secondary | ICD-10-CM | POA: Diagnosis present

## 2019-08-31 DIAGNOSIS — J441 Chronic obstructive pulmonary disease with (acute) exacerbation: Secondary | ICD-10-CM

## 2019-08-31 DIAGNOSIS — N3281 Overactive bladder: Secondary | ICD-10-CM | POA: Diagnosis present

## 2019-08-31 DIAGNOSIS — Z90710 Acquired absence of both cervix and uterus: Secondary | ICD-10-CM | POA: Diagnosis not present

## 2019-08-31 DIAGNOSIS — Z7951 Long term (current) use of inhaled steroids: Secondary | ICD-10-CM | POA: Diagnosis not present

## 2019-08-31 DIAGNOSIS — Z7901 Long term (current) use of anticoagulants: Secondary | ICD-10-CM | POA: Diagnosis not present

## 2019-08-31 DIAGNOSIS — E662 Morbid (severe) obesity with alveolar hypoventilation: Secondary | ICD-10-CM | POA: Diagnosis not present

## 2019-08-31 DIAGNOSIS — Z79899 Other long term (current) drug therapy: Secondary | ICD-10-CM | POA: Diagnosis not present

## 2019-08-31 DIAGNOSIS — Z9981 Dependence on supplemental oxygen: Secondary | ICD-10-CM | POA: Diagnosis not present

## 2019-08-31 DIAGNOSIS — J9622 Acute and chronic respiratory failure with hypercapnia: Secondary | ICD-10-CM | POA: Diagnosis present

## 2019-08-31 DIAGNOSIS — Z66 Do not resuscitate: Secondary | ICD-10-CM | POA: Diagnosis present

## 2019-08-31 DIAGNOSIS — J9621 Acute and chronic respiratory failure with hypoxia: Principal | ICD-10-CM | POA: Diagnosis present

## 2019-08-31 DIAGNOSIS — I480 Paroxysmal atrial fibrillation: Secondary | ICD-10-CM | POA: Diagnosis not present

## 2019-08-31 DIAGNOSIS — E119 Type 2 diabetes mellitus without complications: Secondary | ICD-10-CM | POA: Diagnosis present

## 2019-08-31 DIAGNOSIS — I110 Hypertensive heart disease with heart failure: Secondary | ICD-10-CM | POA: Diagnosis present

## 2019-08-31 DIAGNOSIS — M2550 Pain in unspecified joint: Secondary | ICD-10-CM | POA: Diagnosis not present

## 2019-08-31 DIAGNOSIS — G2581 Restless legs syndrome: Secondary | ICD-10-CM | POA: Diagnosis present

## 2019-08-31 DIAGNOSIS — K219 Gastro-esophageal reflux disease without esophagitis: Secondary | ICD-10-CM | POA: Diagnosis present

## 2019-08-31 DIAGNOSIS — Z87891 Personal history of nicotine dependence: Secondary | ICD-10-CM | POA: Diagnosis not present

## 2019-08-31 DIAGNOSIS — F419 Anxiety disorder, unspecified: Secondary | ICD-10-CM | POA: Diagnosis present

## 2019-08-31 DIAGNOSIS — Z20822 Contact with and (suspected) exposure to covid-19: Secondary | ICD-10-CM | POA: Diagnosis present

## 2019-08-31 DIAGNOSIS — Z6841 Body Mass Index (BMI) 40.0 and over, adult: Secondary | ICD-10-CM

## 2019-08-31 DIAGNOSIS — R531 Weakness: Secondary | ICD-10-CM

## 2019-08-31 DIAGNOSIS — R5381 Other malaise: Secondary | ICD-10-CM | POA: Diagnosis not present

## 2019-08-31 DIAGNOSIS — E876 Hypokalemia: Secondary | ICD-10-CM | POA: Diagnosis present

## 2019-08-31 DIAGNOSIS — F329 Major depressive disorder, single episode, unspecified: Secondary | ICD-10-CM | POA: Diagnosis not present

## 2019-08-31 DIAGNOSIS — D649 Anemia, unspecified: Secondary | ICD-10-CM | POA: Diagnosis present

## 2019-08-31 DIAGNOSIS — Z86711 Personal history of pulmonary embolism: Secondary | ICD-10-CM

## 2019-08-31 DIAGNOSIS — R0602 Shortness of breath: Secondary | ICD-10-CM | POA: Diagnosis not present

## 2019-08-31 DIAGNOSIS — Z7401 Bed confinement status: Secondary | ICD-10-CM | POA: Diagnosis not present

## 2019-08-31 DIAGNOSIS — R609 Edema, unspecified: Secondary | ICD-10-CM | POA: Diagnosis not present

## 2019-08-31 DIAGNOSIS — R0689 Other abnormalities of breathing: Secondary | ICD-10-CM | POA: Diagnosis not present

## 2019-08-31 DIAGNOSIS — I10 Essential (primary) hypertension: Secondary | ICD-10-CM | POA: Diagnosis not present

## 2019-08-31 DIAGNOSIS — R52 Pain, unspecified: Secondary | ICD-10-CM | POA: Diagnosis not present

## 2019-08-31 LAB — BASIC METABOLIC PANEL
Anion gap: 10 (ref 5–15)
Anion gap: 12 (ref 5–15)
BUN: 8 mg/dL (ref 8–23)
BUN: 7 mg/dL — ABNORMAL LOW (ref 8–23)
CO2: 31 mmol/L (ref 22–32)
CO2: 31 mmol/L (ref 22–32)
Calcium: 8.9 mg/dL (ref 8.9–10.3)
Calcium: 8.8 mg/dL — ABNORMAL LOW (ref 8.9–10.3)
Chloride: 102 mmol/L (ref 98–111)
Chloride: 99 mmol/L (ref 98–111)
Creatinine, Ser: 0.56 mg/dL (ref 0.44–1.00)
Creatinine, Ser: 0.63 mg/dL (ref 0.44–1.00)
GFR calc Af Amer: >60 mL/min (ref >60)
GFR calc Af Amer: >60 mL/min (ref >60)
GFR calc non Af Amer: >60 mL/min (ref >60)
GFR calc non Af Amer: >60 mL/min (ref >60)
Glucose, Bld: 107 mg/dL — ABNORMAL HIGH (ref 70–99)
Glucose, Bld: 199 mg/dL — ABNORMAL HIGH (ref 70–99)
Potassium: 2.9 mmol/L — ABNORMAL LOW (ref 3.5–5.1)
Potassium: 3.2 mmol/L — ABNORMAL LOW (ref 3.5–5.1)
Sodium: 143 mmol/L (ref 135–145)
Sodium: 142 mmol/L (ref 135–145)

## 2019-08-31 LAB — URINALYSIS, ROUTINE W REFLEX MICROSCOPIC
Bilirubin Urine: NEGATIVE
Glucose, UA: NEGATIVE mg/dL
Hgb urine dipstick: NEGATIVE
Ketones, ur: NEGATIVE mg/dL
Leukocytes,Ua: NEGATIVE
Nitrite: NEGATIVE
Protein, ur: NEGATIVE mg/dL
Specific Gravity, Urine: 1.024 (ref 1.005–1.030)
pH: 7 (ref 5.0–8.0)

## 2019-08-31 LAB — CBC WITH DIFFERENTIAL/PLATELET
Abs Immature Granulocytes: 0 10*3/uL (ref 0.00–0.07)
Basophils Absolute: 0.1 10*3/uL (ref 0.0–0.1)
Basophils Relative: 2 %
Eosinophils Absolute: 0.3 10*3/uL (ref 0.0–0.5)
Eosinophils Relative: 4 %
HCT: 35 % — ABNORMAL LOW (ref 36.0–46.0)
Hemoglobin: 9.7 g/dL — ABNORMAL LOW (ref 12.0–15.0)
Lymphocytes Relative: 18 %
Lymphs Abs: 1.2 10*3/uL (ref 0.7–4.0)
MCH: 24.7 pg — ABNORMAL LOW (ref 26.0–34.0)
MCHC: 27.7 g/dL — ABNORMAL LOW (ref 30.0–36.0)
MCV: 89.1 fL (ref 80.0–100.0)
Monocytes Absolute: 0.2 10*3/uL (ref 0.1–1.0)
Monocytes Relative: 3 %
Neutro Abs: 5 10*3/uL (ref 1.7–7.7)
Neutrophils Relative %: 73 %
Platelets: 473 10*3/uL — ABNORMAL HIGH (ref 150–400)
RBC: 3.93 MIL/uL (ref 3.87–5.11)
RDW: 21.8 % — ABNORMAL HIGH (ref 11.5–15.5)
WBC: 6.9 10*3/uL (ref 4.0–10.5)
nRBC: 0 % (ref 0.0–0.2)
nRBC: 0 /100 WBC

## 2019-08-31 LAB — POCT I-STAT EG7
Acid-Base Excess: 9 mmol/L — ABNORMAL HIGH (ref 0.0–2.0)
Bicarbonate: 35 mmol/L — ABNORMAL HIGH (ref 20.0–28.0)
Calcium, Ion: 1.15 mmol/L (ref 1.15–1.40)
HCT: 33 % — ABNORMAL LOW (ref 36.0–46.0)
Hemoglobin: 11.2 g/dL — ABNORMAL LOW (ref 12.0–15.0)
O2 Saturation: 84 %
Potassium: 2.9 mmol/L — ABNORMAL LOW (ref 3.5–5.1)
Sodium: 142 mmol/L (ref 135–145)
TCO2: 37 mmol/L — ABNORMAL HIGH (ref 22–32)
pCO2, Ven: 55.3 mmHg (ref 44.0–60.0)
pH, Ven: 7.409 (ref 7.250–7.430)
pO2, Ven: 50 mmHg — ABNORMAL HIGH (ref 32.0–45.0)

## 2019-08-31 LAB — BRAIN NATRIURETIC PEPTIDE: B Natriuretic Peptide: 40 pg/mL (ref 0.0–100.0)

## 2019-08-31 LAB — GLUCOSE, CAPILLARY: Glucose-Capillary: 217 mg/dL — ABNORMAL HIGH (ref 70–99)

## 2019-08-31 LAB — MAGNESIUM: Magnesium: 1.6 mg/dL — ABNORMAL LOW (ref 1.7–2.4)

## 2019-08-31 LAB — POC SARS CORONAVIRUS 2 AG -  ED: SARS Coronavirus 2 Ag: NEGATIVE

## 2019-08-31 MED ORDER — ALBUTEROL SULFATE HFA 108 (90 BASE) MCG/ACT IN AERS
4.0000 | INHALATION_SPRAY | Freq: Once | RESPIRATORY_TRACT | Status: AC
  Administered 2019-08-31: 4 via RESPIRATORY_TRACT
  Filled 2019-08-31: qty 6.7

## 2019-08-31 MED ORDER — GABAPENTIN 100 MG PO CAPS
200.0000 mg | ORAL_CAPSULE | Freq: Three times a day (TID) | ORAL | Status: DC
  Administered 2019-08-31 – 2019-09-03 (×10): 200 mg via ORAL
  Filled 2019-08-31 (×10): qty 2

## 2019-08-31 MED ORDER — KETOTIFEN FUMARATE 0.025 % OP SOLN
1.0000 [drp] | Freq: Two times a day (BID) | OPHTHALMIC | Status: DC | PRN
  Filled 2019-08-31 (×2): qty 5

## 2019-08-31 MED ORDER — ALBUTEROL SULFATE (2.5 MG/3ML) 0.083% IN NEBU
2.5000 mg | INHALATION_SOLUTION | Freq: Four times a day (QID) | RESPIRATORY_TRACT | Status: DC | PRN
  Administered 2019-08-31 – 2019-09-03 (×3): 2.5 mg via RESPIRATORY_TRACT
  Filled 2019-08-31 (×3): qty 3

## 2019-08-31 MED ORDER — MELATONIN 3 MG PO TABS
3.0000 mg | ORAL_TABLET | Freq: Every evening | ORAL | Status: DC | PRN
  Administered 2019-09-01 – 2019-09-03 (×2): 3 mg via ORAL
  Filled 2019-08-31 (×5): qty 1

## 2019-08-31 MED ORDER — POTASSIUM CHLORIDE CRYS ER 20 MEQ PO TBCR
40.0000 meq | EXTENDED_RELEASE_TABLET | Freq: Once | ORAL | Status: AC
  Administered 2019-08-31: 40 meq via ORAL
  Filled 2019-08-31: qty 2

## 2019-08-31 MED ORDER — VENLAFAXINE HCL ER 37.5 MG PO CP24
187.5000 mg | ORAL_CAPSULE | Freq: Every day | ORAL | Status: DC
  Administered 2019-09-01 – 2019-09-03 (×3): 187.5 mg via ORAL
  Filled 2019-08-31 (×4): qty 1

## 2019-08-31 MED ORDER — MAGNESIUM SULFATE 2 GM/50ML IV SOLN
2.0000 g | Freq: Once | INTRAVENOUS | Status: AC
  Administered 2019-09-01: 2 g via INTRAVENOUS
  Filled 2019-08-31: qty 50

## 2019-08-31 MED ORDER — IOHEXOL 350 MG/ML SOLN
75.0000 mL | Freq: Once | INTRAVENOUS | Status: AC | PRN
  Administered 2019-08-31: 75 mL via INTRAVENOUS

## 2019-08-31 MED ORDER — LORATADINE 10 MG PO TABS
10.0000 mg | ORAL_TABLET | Freq: Every day | ORAL | Status: DC
  Administered 2019-08-31 – 2019-09-03 (×4): 10 mg via ORAL
  Filled 2019-08-31 (×4): qty 1

## 2019-08-31 MED ORDER — ALBUTEROL SULFATE HFA 108 (90 BASE) MCG/ACT IN AERS: 2.0000 | INHALATION_SPRAY | Freq: Four times a day (QID) | RESPIRATORY_TRACT | Status: DC | PRN

## 2019-08-31 MED ORDER — MOMETASONE FURO-FORMOTEROL FUM 100-5 MCG/ACT IN AERO
2.0000 | INHALATION_SPRAY | Freq: Two times a day (BID) | RESPIRATORY_TRACT | Status: DC
  Administered 2019-08-31: 2 via RESPIRATORY_TRACT
  Filled 2019-08-31: qty 8.8

## 2019-08-31 MED ORDER — ONDANSETRON HCL 4 MG/2ML IJ SOLN
4.0000 mg | Freq: Once | INTRAMUSCULAR | Status: AC
  Administered 2019-08-31: 4 mg via INTRAVENOUS
  Filled 2019-08-31: qty 2

## 2019-08-31 MED ORDER — FUROSEMIDE 10 MG/ML IJ SOLN
40.0000 mg | Freq: Two times a day (BID) | INTRAMUSCULAR | Status: DC
  Administered 2019-08-31 – 2019-09-01 (×2): 40 mg via INTRAVENOUS
  Filled 2019-08-31 (×2): qty 4

## 2019-08-31 MED ORDER — TOPIRAMATE 25 MG PO TABS
50.0000 mg | ORAL_TABLET | Freq: Every day | ORAL | Status: DC
  Administered 2019-08-31 – 2019-09-03 (×4): 50 mg via ORAL
  Filled 2019-08-31 (×4): qty 2

## 2019-08-31 MED ORDER — APIXABAN 5 MG PO TABS
5.0000 mg | ORAL_TABLET | Freq: Two times a day (BID) | ORAL | Status: DC
  Administered 2019-08-31 – 2019-09-03 (×6): 5 mg via ORAL
  Filled 2019-08-31 (×7): qty 1

## 2019-08-31 MED ORDER — METOPROLOL TARTRATE 12.5 MG HALF TABLET
12.5000 mg | ORAL_TABLET | Freq: Two times a day (BID) | ORAL | Status: DC
  Administered 2019-08-31 – 2019-09-02 (×4): 12.5 mg via ORAL
  Filled 2019-08-31 (×5): qty 1

## 2019-08-31 MED ORDER — FERROUS SULFATE 325 (65 FE) MG PO TABS
325.0000 mg | ORAL_TABLET | Freq: Every day | ORAL | Status: DC
  Administered 2019-09-01 – 2019-09-03 (×3): 325 mg via ORAL
  Filled 2019-08-31 (×3): qty 1

## 2019-08-31 MED ORDER — MONTELUKAST SODIUM 10 MG PO TABS
10.0000 mg | ORAL_TABLET | Freq: Every day | ORAL | Status: DC
  Administered 2019-08-31 – 2019-09-02 (×3): 10 mg via ORAL
  Filled 2019-08-31 (×3): qty 1

## 2019-08-31 MED ORDER — POTASSIUM CHLORIDE 10 MEQ/100ML IV SOLN
10.0000 meq | INTRAVENOUS | Status: AC
  Administered 2019-08-31 (×2): 10 meq via INTRAVENOUS
  Filled 2019-08-31 (×2): qty 100

## 2019-08-31 MED ORDER — CYCLOSPORINE 0.05 % OP EMUL
1.0000 [drp] | Freq: Two times a day (BID) | OPHTHALMIC | Status: DC
  Administered 2019-08-31 – 2019-09-03 (×6): 1 [drp] via OPHTHALMIC
  Filled 2019-08-31 (×8): qty 1

## 2019-08-31 MED ORDER — ACETAMINOPHEN 325 MG PO TABS
650.0000 mg | ORAL_TABLET | Freq: Four times a day (QID) | ORAL | Status: DC | PRN
  Administered 2019-08-31 – 2019-09-03 (×6): 650 mg via ORAL
  Filled 2019-08-31 (×6): qty 2

## 2019-08-31 MED ORDER — VENLAFAXINE HCL 37.5 MG PO TABS: 37.5000 mg | ORAL_TABLET | Freq: Every day | ORAL | Status: DC

## 2019-08-31 MED ORDER — FAMOTIDINE 20 MG PO TABS
20.0000 mg | ORAL_TABLET | Freq: Every day | ORAL | Status: DC
  Administered 2019-08-31 – 2019-09-03 (×4): 20 mg via ORAL
  Filled 2019-08-31 (×4): qty 1

## 2019-08-31 MED ORDER — ATORVASTATIN CALCIUM 40 MG PO TABS
40.0000 mg | ORAL_TABLET | Freq: Every day | ORAL | Status: DC
  Administered 2019-08-31 – 2019-09-03 (×4): 40 mg via ORAL
  Filled 2019-08-31 (×4): qty 1

## 2019-08-31 MED ORDER — FLECAINIDE ACETATE 100 MG PO TABS
100.0000 mg | ORAL_TABLET | Freq: Two times a day (BID) | ORAL | Status: DC
  Administered 2019-08-31 – 2019-09-03 (×6): 100 mg via ORAL
  Filled 2019-08-31 (×8): qty 1

## 2019-08-31 MED ORDER — IPRATROPIUM-ALBUTEROL 0.5-2.5 (3) MG/3ML IN SOLN
3.0000 mL | Freq: Three times a day (TID) | RESPIRATORY_TRACT | Status: DC
  Administered 2019-08-31: 3 mL via RESPIRATORY_TRACT
  Filled 2019-08-31: qty 3

## 2019-08-31 MED ORDER — HYDROXYZINE PAMOATE 25 MG PO CAPS
25.0000 mg | ORAL_CAPSULE | Freq: Two times a day (BID) | ORAL | Status: DC | PRN
  Filled 2019-08-31 (×2): qty 1

## 2019-08-31 MED ORDER — POTASSIUM CHLORIDE 10 MEQ/100ML IV SOLN
10.0000 meq | INTRAVENOUS | Status: AC
  Administered 2019-08-31 (×4): 10 meq via INTRAVENOUS
  Filled 2019-08-31 (×4): qty 100

## 2019-08-31 MED ORDER — PANTOPRAZOLE SODIUM 40 MG PO TBEC
40.0000 mg | DELAYED_RELEASE_TABLET | Freq: Every day | ORAL | Status: DC
  Administered 2019-08-31 – 2019-09-03 (×4): 40 mg via ORAL
  Filled 2019-08-31 (×4): qty 1

## 2019-08-31 MED ORDER — HYDROXYZINE HCL 25 MG PO TABS
25.0000 mg | ORAL_TABLET | Freq: Two times a day (BID) | ORAL | Status: DC | PRN
  Administered 2019-09-01 – 2019-09-02 (×4): 25 mg via ORAL
  Filled 2019-08-31 (×5): qty 1

## 2019-08-31 MED ORDER — PREDNISONE 20 MG PO TABS
40.0000 mg | ORAL_TABLET | Freq: Every day | ORAL | Status: DC
  Administered 2019-09-01 – 2019-09-02 (×2): 40 mg via ORAL
  Filled 2019-08-31 (×2): qty 2

## 2019-08-31 MED ORDER — DILTIAZEM HCL ER COATED BEADS 120 MG PO CP24
120.0000 mg | ORAL_CAPSULE | Freq: Every day | ORAL | Status: DC
  Administered 2019-08-31 – 2019-09-02 (×3): 120 mg via ORAL
  Filled 2019-08-31 (×3): qty 1

## 2019-08-31 MED ORDER — METHYLPREDNISOLONE SODIUM SUCC 125 MG IJ SOLR
125.0000 mg | Freq: Once | INTRAMUSCULAR | Status: AC
  Administered 2019-08-31: 125 mg via INTRAVENOUS
  Filled 2019-08-31: qty 2

## 2019-08-31 NOTE — ED Triage Notes (Addendum)
Pt to ED via EMS from home, c/o [facility]-  HX COPD, CHF, HTN, pt normally requires 7L O2, currently requiring 8l with EMS O2 99%, #20 LFA- No medications given by EMS. Hx A fib- 12 Lead A fib, Last VS : 156/103, CBG 110, hr 77, RR24, temp 98.2 No known COVID exposure.

## 2019-08-31 NOTE — ED Notes (Signed)
Pt transported to CT ?

## 2019-08-31 NOTE — ED Notes (Signed)
RT consulted for prn neb.

## 2019-08-31 NOTE — ED Provider Notes (Signed)
[facility] EMER[facility]ENC[facility] DEPARTMENT Provider Note   CSN: [id number] Arrival date & time: 08/31/19  [id number]     History Chief Complaint  Patient presents with  . Shortness of Breath    [name] White is a 64 y.o. female.  64 y.o female with a PMH of COPD, CHF, OSA presents to the ED with a chief complaint of shortness of breath which began 2 days ago.  Patient is currently on home oxygen 7 L according to her prior discharge papers, she reports she feels that she has been feeling more weak, fatigued, short of breath.  Shortness of breath is exacerbated with lying flat, states she has been sleeping sitting up this is the only way she can catch her breath.  Also endorses a dry cough, reports she was diagnosed with pneumonia early in February and was told that this will likely lingering for several months.  She is currently on furosemide 40 mg daily, does report her symptoms feel somewhat similar to her heart failure exacerbation, she has not noted any weight gain at home.  There   No fever, chest pain, leg swelling.    Shortness of Breath Severity:  Moderate Onset quality:  Gradual Duration:  2 days Timing:  Constant Progression:  Worsening Chronicity:  Recurrent Context: not smoke exposure and not weather changes   Relieved by:  Nothing Worsened by:  Movement, activity and coughing Ineffective treatments:  Position changes Associated symptoms: cough   Associated symptoms: no abdominal pain, no chest pain, no diaphoresis, no fever, no headaches, no sore throat, no sputum production and no vomiting   Risk factors: hx of PE/DVT        Past Medical History:  Diagnosis Date  . Abdominal wall hematoma 03/06/2019  . Acute bronchitis 08/29/2018  . Acute on chronic respiratory failure with hypoxia ([place]) 08/29/2018  . Anxiety   . Asthma   . Chronic lower back pain   . COPD (chronic obstructive pulmonary disease) ([place])   . Depression   . Exposure to COVID-19 virus 11/02/2018    . Fall 02/2019  . Family history of anesthesia complication    "daughter; causes her to pass out afterwards"  . GERD (gastroesophageal reflux disease)   . History of atrial flutter 06/26/2015  . History of pulmonary embolus (PE) 11/03/2014  . Hypertension   . Hyperthyroidism   . Left medial tibial stress syndrome 12/26/2017  . Lower GI bleed   . Migraine    "monthly" (12/28/2013)  . Non-traumatic rhabdomyolysis 11/03/2014  . Obesity hypoventilation syndrome ([place]) 03/06/2019  . Osteoarthritis    "both knees; back of my neck; right pelvic bone" (12/28/2013)  . Paroxysmal A-fib ([place])   . Pulmonary embolism ([place]) 12/28/2013   "2 clots in each lung"    Patient Active Problem List   Diagnosis Date Noted  . Debility 08/02/2019  . Abscess 08/02/2019  . Acute hypoxemic respiratory failure ([place]) 07/21/2019  . Hypertension associated with diabetes ([place])   . Fever   . COPD (chronic obstructive pulmonary disease) ([place]) 06/21/2019  . Community acquired pneumonia 06/14/2019  . Weight gain due to medication 06/05/2019  . Acute respiratory failure with hypoxia ([place]) 05/28/2019  . Bronchitis with acute wheezing 05/09/2019  . Hyperglycemia 03/19/2019  . Acute on chronic respiratory failure with hypoxemia ([place]) 03/14/2019  . Acute on chronic heart failure with preserved ejection fraction ([place]) 03/14/2019  . Acquired thrombophilia ([place]) due to A-Fib 03/07/2019  . Heart failure with preserved ejection fraction ([place])  03/06/2019  . Obesity hypoventilation syndrome ([place]) 03/06/2019  . Lower GI bleed   . Urge incontinence 01/27/2019  . COPD exacerbation ([place]) 08/29/2018  . COPD with acute exacerbation ([place]) 08/07/2018  . OSA (obstructive sleep apnea) 07/05/2018  . Respiratory failure ([place]) 07/05/2018  . Severe episode of recurrent major depressive disorder, without psychotic features ([place])   . MDD (major depressive disorder), recurrent episode, severe ([place]) 09/07/2015  . Atrial flutter ([place]) 07/29/2015  .  Asthma 11/12/2014  . History of hypertension 11/05/2014  . Paroxysmal atrial fibrillation ([place]) 11/03/2014  . Chronic anticoagulation 11/03/2014  . Major depressive disorder 11/03/2014  . History of pulmonary embolus (PE) 11/03/2014  . MDD (major depressive disorder), recurrent severe, without psychosis ([place]) 03/05/2014  . Vocal cord dysfunction 11/12/2013  . Nonintractable headache 01/06/2012  . Hot flashes 04/22/2011  . OVERACTIVE BLADDER 04/18/2008  . Osteoarthritis of both knees 04/18/2008  . Osteoarthritis involving multiple joints on both sides of body 09/14/2007  . Morbid obesity ([place]) 08/24/2006  . RESTLESS LEGS SYNDROME 08/24/2006  . HYPERTENSION, BENIGN SYSTEMIC 08/24/2006  . RHINITIS, ALLERGIC 08/24/2006  . GASTROESOPHAGEAL REFLUX, NO ESOPHAGITIS 08/24/2006    Past Surgical History:  Procedure Laterality Date  . ABDOMINAL HYSTERECTOMY    . APPENDECTOMY    . BREAST CYST EXCISION Right   . DILATION AND CURETTAGE OF UTERUS    . ELECTROPHYSIOLOGIC STUDY N/A 05/27/2015   Procedure: A-Flutter;  Surgeon: Evans Lance, MD;  Location: [facility];  Service: Cardiovascular;  Laterality: N/A;  . EXCISIONAL HEMORRHOIDECTOMY    . NASAL SINUS SURGERY  2007  . THYROIDECTOMY, PARTIAL Right 2005  . TUBAL LIGATION    . WISDOM TOOTH EXTRACTION       OB History   No obstetric history on file.     Family History  Problem Relation Age of Onset  . Osteoarthritis Mother   . Asthma Mother   . Heart failure Mother   . Breast cancer Daughter     Social History   Tobacco Use  . Smoking status: Former Smoker    Packs/day: 0.25    Years: 20.00    Pack years: 5.00    Types: Cigarettes    Quit date: 07/17/1999    Years since quitting: 20.1  . Smokeless tobacco: Never Used  Substance Use Topics  . Alcohol use: Not Currently    Comment: "drank some in my 30's"  . Drug use: No    Home Medications Prior to Admission medications   Medication Sig Start Date End Date  Taking? Authorizing Provider  acetaminophen (TYLENOL) 325 MG tablet Take 2 tablets (650 mg total) by mouth every 6 (six) hours as needed for mild pain. 09/11/18  Yes Georgette Shell, MD  albuterol (PROVENTIL) (2.5 MG/3ML) 0.083% nebulizer solution Take 3 mLs (2.5 mg total) by nebulization every 6 (six) hours as needed for wheezing or shortness of breath. 07/09/18  Yes Collene Gobble, MD  apixaban (ELIQUIS) 5 MG TABS tablet Take 1 tablet (5 mg total) by mouth 2 (two) times daily. 07/29/19  Yes [name], MD  atorvastatin (LIPITOR) 40 MG tablet Take 1 tablet (40 mg total) by mouth daily at 6 PM. 07/25/19  Yes Bonnita Hollow, MD  azelastine (ASTELIN) 0.1 % nasal spray Place 2 sprays into both nostrils 2 (two) times daily. Use in each nostril as directed Patient taking differently: Place 2 sprays into both nostrils daily.  06/01/19  Yes Lattie Haw, MD  azelastine (OPTIVAR) 0.05 %  ophthalmic solution Place 1 drop into both eyes 2 (two) times daily as needed (itchy eyes).   Yes [provider]  cycloSPORINE (RESTASIS) 0.05 % ophthalmic emulsion Place 1 drop into both eyes 2 (two) times daily.   Yes [provider]  diclofenac sodium (VOLTAREN) 1 % GEL APPLY 2 GRAMS EXTERNALLY TO THE AFFECTED AREA FOUR TIMES DAILY Patient taking differently: Apply 2 g topically 4 (four) times daily as needed (arthritis pain - knees, shoulder and thighs).  07/31/18  Yes [name], MD  diltiazem (CARDIZEM CD) 120 MG 24 hr capsule Take 1 capsule (120 mg total) by mouth daily. Patient taking differently: Take 120 mg by mouth at bedtime.  06/07/19  Yes Seiler, Amber K, NP  famotidine (PEPCID) 20 MG tablet Take 1 tablet (20 mg total) by mouth daily. 06/05/19  Yes Anderson, Chelsey L, DO  ferrous sulfate 325 (65 FE) MG tablet Take 1 tablet (325 mg total) by mouth daily with breakfast. 08/12/19  Yes Welborn, Ryan, DO  flecainide (TAMBOCOR) 100 MG tablet TAKE 1 TABLET BY MOUTH 2 TIMES A DAY. PLEASE  KEEP UPCOMING APPOINTMENT IN JANUARY BEFORE ANYMORE REFILLS. Patient taking differently: TAKE 1 TABLET BY MOUTH 2 TIMES A DAY. PLEASE KEEP UPCOMING APPOINTMENT IN JANUARY BEFORE ANYMORE REFILLS. 12/03/18  Yes Seiler, [employer] K, NP  fluticasone (FLONASE) 50 MCG/ACT nasal spray Place 2 sprays into both nostrils daily.  10/29/18  Yes [provider]  furosemide (LASIX) 40 MG tablet Take 40 mg by mouth daily. 08/06/19  Yes [provider]  gabapentin (NEURONTIN) 100 MG capsule Take 2 capsules (200 mg total) by mouth 3 (three) times daily. 07/31/19  Yes [name], MD  hydrOXYzine (VISTARIL) 25 MG capsule Take 1 capsule (25 mg total) by mouth 2 (two) times daily as needed for anxiety. 06/02/19  Yes Rory Percy, DO  ipratropium-albuterol (DUONEB) 0.5-2.5 (3) MG/3ML SOLN Take 3 mLs by nebulization every 12 (twelve) hours as needed (shortness of breath, wheezing). 07/07/19  Yes Brimage, Vondra, DO  loratadine (CLARITIN) 10 MG tablet TAKE 1 TABLET(10 MG) BY MOUTH DAILY Patient taking differently: TAKE 1 TABLET(10 MG) BY MOUTH DAILY 03/05/19  Yes [name], MD  Melatonin 3 MG TABS Take 1 tablet (3 mg total) by mouth at bedtime as needed (sleep). Patient taking differently: Take 3 mg by mouth at bedtime.  07/07/19  Yes Brimage, Vondra, DO  metoprolol tartrate (LOPRESSOR) 25 MG tablet Take 0.5 tablets (12.5 mg total) by mouth 2 (two) times daily. 06/02/19  Yes Rumball, Bryson Ha, DO  mometasone-formoterol (DULERA) 100-5 MCG/ACT AERO Inhale 2 puffs into the lungs 2 (two) times daily. 06/05/19  Yes Anderson, Chelsey L, DO  montelukast (SINGULAIR) 10 MG tablet Take 1 tablet (10 mg total) by mouth at bedtime. 08/12/19  Yes [name], MD  omeprazole (PRILOSEC) 20 MG capsule Take 1 capsule (20 mg total) by mouth daily. 06/01/19  Yes Lattie Haw, MD  OXYGEN Inhale 7 L into the lungs continuous.    Yes [provider]  topiramate (TOPAMAX) 50 MG tablet Take 1 tablet (50 mg total) by mouth  daily. 07/25/19  Yes Bonnita Hollow, MD  umeclidinium bromide (INCRUSE ELLIPTA) 62.5 MCG/INH AEPB Inhale 1 puff into the lungs daily. 07/08/19  Yes Brimage, Vondra, DO  venlafaxine (EFFEXOR) 37.5 MG tablet Take 37.5 mg by mouth daily with breakfast. Take with a 150 mg ER capsule every morning   Yes [provider]  venlafaxine XR (EFFEXOR-XR) 150 MG 24 hr capsule Take 1  capsule (150 mg total) by mouth daily with breakfast. Take 150mg  capsule with 37.5mg  tablet Patient taking differently: Take 150 mg by mouth daily with breakfast. Take with a 37.5 mg tablet every morning 06/01/19  Yes Lattie Haw, MD  guaiFENesin-dextromethorphan (ROBITUSSIN DM) 100-10 MG/5ML syrup Take 5 mLs by mouth every 4 (four) hours as needed for cough. Patient not taking: Reported on 08/31/2019 06/01/19   Lattie Haw, MD  polyethylene glycol (MIRALAX / GLYCOLAX) 17 g packet Take 17 g by mouth daily as needed for mild constipation. Patient not taking: Reported on 08/09/2019 07/25/19   Bonnita Hollow, MD  potassium chloride SA (KLOR-CON) 20 MEQ tablet Take 2 tablets (40 mEq total) by mouth 2 (two) times daily. Patient not taking: Reported on 08/31/2019 06/16/19 09/25/19  Kathrene Alu, MD  VENTOLIN HFA 108 (90 Base) MCG/ACT inhaler INHALE 2 PUFFS INTO THE LUNGS EVERY 6 HOURS AS NEEDED FOR WHEEZING OR SHORTNESS OF BREATH Patient taking differently: Inhale 2 puffs into the lungs every 6 (six) hours as needed for wheezing or shortness of breath.  06/07/19   [name], MD    Allergies    Caffeine, Hydralazine hcl, Hydrocodone, Ciprofloxacin, Erythromycin, Lisinopril, Oxycodone, and Sulfamethoxazole-trimethoprim  Review of Systems   Review of Systems  Constitutional: Negative for diaphoresis and fever.  HENT: Negative for rhinorrhea and sore throat.   Respiratory: Positive for cough and shortness of breath. Negative for sputum production.   Cardiovascular: Negative for chest pain, palpitations and leg  swelling.  Gastrointestinal: Negative for abdominal pain, nausea and vomiting.  Genitourinary: Negative for flank pain.  Musculoskeletal: Negative for back pain.  Skin: Negative for pallor and wound.  Neurological: Negative for headaches.  All other systems reviewed and are negative.   Physical Exam Updated Vital Signs BP (!) 118/45   Pulse 86   Temp 98.1 F (36.7 C) (Oral)   Resp 18   Ht 5\' 5"  (1.651 m)   Wt (!) 164.4 kg   SpO2 (!) 89%   BMI 60.31 kg/m   Physical Exam Vitals and nursing note reviewed.  Constitutional:      Appearance: She is ill-appearing.  HENT:     Head: Normocephalic and atraumatic.  Eyes:     Pupils: Pupils are equal, round, and reactive to light.  Cardiovascular:     Rate and Rhythm: Normal rate.  Pulmonary:     Effort: Pulmonary effort is normal.     Breath sounds: Normal breath sounds. No decreased breath sounds, wheezing or rhonchi.     Comments: Lungs are difficult to auscultation due to body habitus.  Chest:     Chest wall: No mass or tenderness.  Musculoskeletal:     Right lower leg: Tenderness present. No edema.     Left lower leg: No tenderness. No edema.  Skin:    General: Skin is warm and dry.  Neurological:     Mental Status: She is alert and oriented to person, place, and time.     ED Results / Procedures / Treatments   Labs (all labs ordered are listed, but only abnormal results are displayed) Labs Reviewed  CBC WITH DIFFERENTIAL/PLATELET - Abnormal; Notable for the following components:      Result Value   Hemoglobin 9.7 (*)    HCT 35.0 (*)    MCH 24.7 (*)    MCHC 27.7 (*)    RDW 21.8 (*)    Platelets 473 (*)    All other components within normal limits  BASIC  METABOLIC PANEL - Abnormal; Notable for the following components:   Potassium 2.9 (*)    Glucose, Bld 107 (*)    All other components within normal limits  MAGNESIUM - Abnormal; Notable for the following components:   Magnesium 1.6 (*)    All other  components within normal limits  POCT I-STAT EG7 - Abnormal; Notable for the following components:   pO2, Ven 50.0 (*)    Bicarbonate 35.0 (*)    TCO2 37 (*)    Acid-Base Excess 9.0 (*)    Potassium 2.9 (*)    HCT 33.0 (*)    Hemoglobin 11.2 (*)    All other components within normal limits  BRAIN NATRIURETIC PEPTIDE  URINALYSIS, ROUTINE W REFLEX MICROSCOPIC  POC SARS CORONAVIRUS 2 AG -  ED  I-STAT VENOUS BLOOD GAS, ED    EKG EKG Interpretation  Date/Time:  Saturday August 31 2019 11:09:53 EST Ventricular Rate:  82 PR Interval:    QRS Duration: 92 QT Interval:  468 QTC Calculation: 544 R Axis:   87 Text Interpretation: Sinus rhythm Short PR interval Borderline right axis deviation Low voltage, precordial leads Borderline T abnormalities, diffuse leads Prolonged QT interval Poor data quality in current ECG precludes serial comparison Confirmed by Sherwood Gambler [id number]) on 08/31/2019 11:28:10 AM   Radiology DG Chest Portable 1 View  Result Date: 08/31/2019 CLINICAL DATA:  Shortness of breath EXAM: PORTABLE CHEST 1 VIEW COMPARISON:  None. FINDINGS: Low lung volumes. Stable elevation of the right hemidiaphragm. Probable bibasilar atelectasis. No significant pleural effusion. No pneumothorax. Stable cardiomediastinal contours. IMPRESSION: Low lung volumes with probable bibasilar atelectasis. Electronically Signed   By: Macy Mis M.D.   On: 08/31/2019 10:33    Procedures Procedures (including critical care time)  Medications Ordered in ED Medications  albuterol (VENTOLIN HFA) 108 (90 Base) MCG/ACT inhaler 4 puff (4 puffs Inhalation Given 08/31/19 0959)  potassium chloride 10 mEq in 100 mL IVPB (0 mEq Intravenous Stopped 08/31/19 1415)  potassium chloride SA (KLOR-CON) CR tablet 40 mEq (40 mEq Oral Given 08/31/19 1118)  methylPREDNISolone sodium succinate (SOLU-MEDROL) 125 mg/2 mL injection 125 mg (125 mg Intravenous Given 08/31/19 1248)    ED Course  I have reviewed the triage vital  signs and the nursing notes.  Pertinent labs & imaging results that were available during my care of the patient were reviewed by me and considered in my medical decision making (see chart for details).    MDM Rules/Calculators/A&P  Patient with extensive past medical history including COPD, hypertension, A. fib, prior history of recurrent PE presents to the ED with complaints of shortness of breath for the past 2 days.  Patient reports the symptoms are exacerbated with lying flat, states that she feels somewhat similar to her prior episodes of heart failure.  She has not been running any fevers, was diagnosed with pneumonia sometime in February, reports she has a lingering cough to this, this is dry.  Denies any chest pain, does not report any weight gain at home.  She is currently on 7 L of O2 per discharge paperwork, reports she has been feeling like she is unable to catch her breath if she lies flat.  During evaluation patient is sitting upright on stretcher, states that she is unable to fully lie flat.  She is able to speak in full sentences, satting at 96% on 3 L of O2.  Potation of her labs by me revealed a BMP with hypokalemia, creatinine level is within normal limits.  A replacement of IV along with oral potassium has been ordered for patient.  CBC without any leukocytosis, hemoglobin is at her baseline.  Magnesium is slightly decreased but stable, BNP is normal on today's visit.  Rapid COVID-19 was negative.  X-ray of her chest showed: Low lung volumes with probable bibasilar atelectasis.      12:57 PM Patient is receiving potassium replacement, significant wheezing continues, straight her rate is now 28, she is waxing and waning with her O2 saturations but maintaining at 96%.  Reports that she has been increasingly weak.  Patient was provided with steroids, potassium replacement. Due to continue   2:31 PM Spoke to family medicine resident service, appreciate their help. Patient stable for  further care.    Portions of this note were generated with Lobbyist. Dictation errors may occur despite best attempts at proofreading.  Final Clinical Impression(s) / ED Diagnoses Final diagnoses:  Hypokalemia  SOB (shortness of breath)  Weakness    Rx / DC Orders ED Discharge Orders    None       Janeece Fitting, PA-C 08/31/19 1432    Sherwood Gambler, MD 08/31/19 1549

## 2019-08-31 NOTE — ED Notes (Signed)
Attempted to call nursing report.  

## 2019-08-31 NOTE — H&P (Addendum)
[facility] Admission History and Physical Service Pager: [phone]  Patient name: [name] White Medical record number: [id number] Date of birth: [birth year] Age: 64 y.o. Gender: female  Primary Care Provider: [name], MD Consultants: none Code Status: DNR Preferred Emergency Contact: niece Jonelle Sidle [phone])  Chief Complaint: feeling bad  Assessment and Plan: KENDREA PACER is a 64 y.o. female presenting with desaturations . PMH is significant for COPD, MDD, morbid obesity, HTN, GERD, overactive bladder, osteoarthritis of both knees, a flutter, paroxysmal A. fib, chronic anticoagulation, OSA, history of PE,HFpEF, DM  Acute shortness of breath Differential includes CHF exacerbation vs COPD exacerbation vs PE.  Patient presented to the emergency department after 3 days increase shortness of breath especially while lying down.  On initial evaluation patient was on 2 L O2 satting 96%.  WBC-6.9, BNP-40.  EKG showed sinus rhythm with large amount of artifact.  Rapid COVID-19 negative.  VBG showed pH-7.409, PO2-50, PCO2-37. chest x-ray showed low lung volumes with probable bilateral atelectasis.  She had increased work of breathing and diffuse wheezing on exam. Per ED report, patient had reported desaturation to 84% on 5 L O2 with ambulation.  CHF exacerbation possible but low on differential because of BNP of 40.  She also has no excessive lower extremity edema and no crackles on lung exam.  Regarding COPD exacerbation, patient had pulmonary function tests in January 2020 which showed no evidence of COPD.  In saying that, patient has had multiple hospital admissions in the last few months for acute respiratory failure or shortness of breath.  She has had pneumonia and is on 7 L O2 at home.  Home medications include albuterol, DuoNeb's every 12 hours as needed, Claritin, Dulera, Singulair, Incruse Ellipta.  O2 saturation on 5 L nasal cannula on our evaluation  ranged between 92% and 94%.  Addressing the differential of pulmonary embolism, patient has history of pulmonary embolism and is anticoagulated on Eliquis.  She is on standard dosing of Eliquis which was studied in the patient population with a BMI of less than 30.  Patient's BMI is 60.31 which may affect the efficacy of the dose of Eliquis that the patient is on.  Patient had difficulty describing onset of shortness of breath and given patient's history of PE it must be considered in the differential. CTA chest shows no central PE, possible bibasilar atelectasis vs infiltrates which could be indicating the sequelae of recent pneumonia (discharged 2/15).  -Admit to inpatient teaching service with Dr. Nori Riis as attending -Continuous cardiac monitoring -Continuous pulse ox -Albuterol 2.5 mg every 6 hours as needed -Duo nebs scheduled every 8 hours -Dulera 2 puffs twice daily -Singulair 10 mg daily at bedtime -Prednisone 40 mg daily -Consider addition of antibiotics -40 mg IV Lasix BID -PT/OT eval and treat -BiPAP QHS - COVID PCR  Hypokalemia K on admission was 2.9, Mg 1.6.  Patient started on 60 mEq of potassium in the emergency department. - 1900 BMP - 2mg  Mg + 8mEq more K+ -Morning BMP -Replete as appropriate  History of PE Patient has history of pulmonary embolism with chronic swelling of left lower extremity.  Patient currently complaining of 3-day history of shortness of breath.  Patient had difficulty describing onset of shortness of breath. Currently takes Eliquis at home.  Geneva score is 6.  Satting 92-94 % on 5 L O2. -Continue Eliquis 5 mg twice daily -CTA to evaluate for PE- negative  Diabetes Patient diagnosed with diabetes during admission in December.  Her hemoglobin A1c at that time was 7.8.  Glucose on admission today was 107.  Patient was on multiple steroids over the last several months as well as months prior to her hospitalization in December.  On her last discharge she  was not discharged with any diabetic medications -Sensitive sliding scale insulin -Monitor CBGs -Hemoglobin A1c on 3/19 or later.  Paroxysmal A. fib  Home medications include digoxin 120 mg nightly, metoprolol 12.5 mg twice daily, flecainide 100 mg, Eliquis. -Continue home medications  History of hypertension Patient home medications include metoprolol, Cardizem, Lasix.  Blood pressure in emergency department ranged from 91/81-144/82. -Continue home medications -Vitals per floor routine  OSA/OHS Patient on trilogy BiPAP at home -BiPAP as needed -BiPAP at night -Consider repeat sleep study outpatient  Morbid obesity Current weight is 164.4 kg with a BMI of 60.31.  This is approximately the same weight as her previous admission. -Encouraged healthy lifestyle and weight management  MDD/anxiety Patient with history of anxiety.  Home medication includes Atarax 25 mg and Effexor 187.5 mg daily -Continue home medications  GERD Patient takes Prilosec 20 mg and Pepcid 20 mg at home -Continue home meds  FEN/GI: Heart healthy Prophylaxis: Eliquis  Disposition: Admit to telemetry  History of Present Illness:  [name] White is a 65 y.o. female presenting from home with increased shortness of breath over the past 3 days.  She reports that for the last 3 days she has had increased shortness of breath especially while laying down.  She also feels weaker and more fatigued.  She is currently on 7 L of oxygen at home.  Patient was diagnosed with pneumonia and February and since that time has had a chronic cough.  She was seen in our family medicine clinic 4 days ago for this issue.  Patient denies any fever, chills, runny nose, chest pain.  She acknowledges increased bilateral lower extremity edema.  In the ED patient was evaluated and was unable to lie flat but was able to speak in full sentences and satted 96% on 3 L O2.  She had oral potassium ordered.  Rapid Covid test was negative.   X-ray showed low lung volumes with probable bibasilar atelectasis.  Patient was reevaluated and had continuation of significant wheezing, tachypnea up to 28 but increased work of breathing.  She was given steroids and family medicine teaching service was called for admission.  Review Of Systems: Per HPI with the following additions:   ROS  Patient Active Problem List   Diagnosis Date Noted  . Debility 08/02/2019  . Abscess 08/02/2019  . Acute hypoxemic respiratory failure ([place]) 07/21/2019  . Hypertension associated with diabetes ([place])   . Fever   . COPD (chronic obstructive pulmonary disease) ([place]) 06/21/2019  . Community acquired pneumonia 06/14/2019  . Weight gain due to medication 06/05/2019  . Acute respiratory failure with hypoxia ([place]) 05/28/2019  . Bronchitis with acute wheezing 05/09/2019  . Hyperglycemia 03/19/2019  . Acute on chronic respiratory failure with hypoxemia ([place]) 03/14/2019  . Acute on chronic heart failure with preserved ejection fraction ([place]) 03/14/2019  . Acquired thrombophilia ([place]) due to A-Fib 03/07/2019  . Heart failure with preserved ejection fraction ([place]) 03/06/2019  . Obesity hypoventilation syndrome ([place]) 03/06/2019  . Lower GI bleed   . Urge incontinence 01/27/2019  . COPD exacerbation ([place]) 08/29/2018  . COPD with acute exacerbation ([place]) 08/07/2018  . OSA (obstructive sleep apnea) 07/05/2018  . Respiratory failure ([place]) 07/05/2018  . Severe episode of recurrent major  depressive disorder, without psychotic features ([place])   . MDD (major depressive disorder), recurrent episode, severe ([place]) 09/07/2015  . Atrial flutter ([place]) 07/29/2015  . Asthma 11/12/2014  . History of hypertension 11/05/2014  . Paroxysmal atrial fibrillation ([place]) 11/03/2014  . Chronic anticoagulation 11/03/2014  . Major depressive disorder 11/03/2014  . History of pulmonary embolus (PE) 11/03/2014  . MDD (major depressive disorder), recurrent severe, without psychosis ([place])  03/05/2014  . Vocal cord dysfunction 11/12/2013  . Nonintractable headache 01/06/2012  . Hot flashes 04/22/2011  . OVERACTIVE BLADDER 04/18/2008  . Osteoarthritis of both knees 04/18/2008  . Osteoarthritis involving multiple joints on both sides of body 09/14/2007  . Morbid obesity ([place]) 08/24/2006  . RESTLESS LEGS SYNDROME 08/24/2006  . HYPERTENSION, BENIGN SYSTEMIC 08/24/2006  . RHINITIS, ALLERGIC 08/24/2006  . GASTROESOPHAGEAL REFLUX, NO ESOPHAGITIS 08/24/2006    Past Medical History: Past Medical History:  Diagnosis Date  . Abdominal wall hematoma 03/06/2019  . Acute bronchitis 08/29/2018  . Acute on chronic respiratory failure with hypoxia ([place]) 08/29/2018  . Anxiety   . Asthma   . Chronic lower back pain   . COPD (chronic obstructive pulmonary disease) ([place])   . Depression   . Exposure to COVID-19 virus 11/02/2018  . Fall 02/2019  . Family history of anesthesia complication    "daughter; causes her to pass out afterwards"  . GERD (gastroesophageal reflux disease)   . History of atrial flutter 06/26/2015  . History of pulmonary embolus (PE) 11/03/2014  . Hypertension   . Hyperthyroidism   . Left medial tibial stress syndrome 12/26/2017  . Lower GI bleed   . Migraine    "monthly" (12/28/2013)  . Non-traumatic rhabdomyolysis 11/03/2014  . Obesity hypoventilation syndrome ([place]) 03/06/2019  . Osteoarthritis    "both knees; back of my neck; right pelvic bone" (12/28/2013)  . Paroxysmal A-fib ([place])   . Pulmonary embolism ([place]) 12/28/2013   "2 clots in each lung"    Past Surgical History: Past Surgical History:  Procedure Laterality Date  . ABDOMINAL HYSTERECTOMY    . APPENDECTOMY    . BREAST CYST EXCISION Right   . DILATION AND CURETTAGE OF UTERUS    . ELECTROPHYSIOLOGIC STUDY N/A 05/27/2015   Procedure: A-Flutter;  Surgeon: Evans Lance, MD;  Location: [facility];  Service: Cardiovascular;  Laterality: N/A;  . EXCISIONAL HEMORRHOIDECTOMY    . NASAL SINUS SURGERY  2007  .  THYROIDECTOMY, PARTIAL Right 2005  . TUBAL LIGATION    . WISDOM TOOTH EXTRACTION      Social History: Social History   Tobacco Use  . Smoking status: Former Smoker    Packs/day: 0.25    Years: 20.00    Pack years: 5.00    Types: Cigarettes    Quit date: 07/17/1999    Years since quitting: 20.1  . Smokeless tobacco: Never Used  Substance Use Topics  . Alcohol use: Not Currently    Comment: "drank some in my 30's"  . Drug use: No   Family History: Family History  Problem Relation Age of Onset  . Osteoarthritis Mother   . Asthma Mother   . Heart failure Mother   . Breast cancer Daughter     Allergies and Medications: Allergies  Allergen Reactions  . Caffeine Other (See Comments)    Migraine  . Hydralazine Hcl Other (See Comments)    Other reaction(s): Other (See Comments) AKI leading to rhabdo and electrolyte abnormalities  . Hydrocodone Nausea And Vomiting    Headache  .  Ciprofloxacin Hives and Rash  . Erythromycin Hives and Rash  . Lisinopril Cough  . Oxycodone Nausea And Vomiting    Headache  . Sulfamethoxazole-Trimethoprim Rash   No current facility-administered medications on file prior to encounter.   Current Outpatient Medications on File Prior to Encounter  Medication Sig Dispense Refill  . acetaminophen (TYLENOL) 325 MG tablet Take 2 tablets (650 mg total) by mouth every 6 (six) hours as needed for mild pain.    Marland Kitchen albuterol (PROVENTIL) (2.5 MG/3ML) 0.083% nebulizer solution Take 3 mLs (2.5 mg total) by nebulization every 6 (six) hours as needed for wheezing or shortness of breath. 75 mL 4  . apixaban (ELIQUIS) 5 MG TABS tablet Take 1 tablet (5 mg total) by mouth 2 (two) times daily. 60 tablet 0  . atorvastatin (LIPITOR) 40 MG tablet Take 1 tablet (40 mg total) by mouth daily at 6 PM. 30 tablet 3  . azelastine (ASTELIN) 0.1 % nasal spray Place 2 sprays into both nostrils 2 (two) times daily. Use in each nostril as directed (Patient taking differently: Place  2 sprays into both nostrils daily. ) 30 mL 0  . azelastine (OPTIVAR) 0.05 % ophthalmic solution Place 1 drop into both eyes 2 (two) times daily as needed (itchy eyes).    . cycloSPORINE (RESTASIS) 0.05 % ophthalmic emulsion Place 1 drop into both eyes 2 (two) times daily.    . diclofenac sodium (VOLTAREN) 1 % GEL APPLY 2 GRAMS EXTERNALLY TO THE AFFECTED AREA FOUR TIMES DAILY (Patient taking differently: Apply 2 g topically 4 (four) times daily as needed (arthritis pain - knees, shoulder and thighs). ) 100 g 0  . diltiazem (CARDIZEM CD) 120 MG 24 hr capsule Take 1 capsule (120 mg total) by mouth daily. (Patient taking differently: Take 120 mg by mouth at bedtime. ) 90 capsule 0  . famotidine (PEPCID) 20 MG tablet Take 1 tablet (20 mg total) by mouth daily. 90 tablet 0  . ferrous sulfate 325 (65 FE) MG tablet Take 1 tablet (325 mg total) by mouth daily with breakfast. 30 tablet 0  . flecainide (TAMBOCOR) 100 MG tablet TAKE 1 TABLET BY MOUTH 2 TIMES A DAY. PLEASE KEEP UPCOMING APPOINTMENT IN JANUARY BEFORE ANYMORE REFILLS. (Patient taking differently: TAKE 1 TABLET BY MOUTH 2 TIMES A DAY. PLEASE KEEP UPCOMING APPOINTMENT IN JANUARY BEFORE ANYMORE REFILLS.) 60 tablet 6  . fluticasone (FLONASE) 50 MCG/ACT nasal spray Place 2 sprays into both nostrils daily.     . furosemide (LASIX) 40 MG tablet Take 40 mg by mouth daily.    Marland Kitchen gabapentin (NEURONTIN) 100 MG capsule Take 2 capsules (200 mg total) by mouth 3 (three) times daily. 90 capsule 0  . hydrOXYzine (VISTARIL) 25 MG capsule Take 1 capsule (25 mg total) by mouth 2 (two) times daily as needed for anxiety. 30 capsule 0  . ipratropium-albuterol (DUONEB) 0.5-2.5 (3) MG/3ML SOLN Take 3 mLs by nebulization every 12 (twelve) hours as needed (shortness of breath, wheezing). 360 mL 3  . loratadine (CLARITIN) 10 MG tablet TAKE 1 TABLET(10 MG) BY MOUTH DAILY (Patient taking differently: TAKE 1 TABLET(10 MG) BY MOUTH DAILY) 90 tablet 0  . Melatonin 3 MG TABS Take 1  tablet (3 mg total) by mouth at bedtime as needed (sleep). (Patient taking differently: Take 3 mg by mouth at bedtime. ) 30 tablet 0  . metoprolol tartrate (LOPRESSOR) 25 MG tablet Take 0.5 tablets (12.5 mg total) by mouth 2 (two) times daily. 60 tablet 0  .  mometasone-formoterol (DULERA) 100-5 MCG/ACT AERO Inhale 2 puffs into the lungs 2 (two) times daily. 13 g 0  . montelukast (SINGULAIR) 10 MG tablet Take 1 tablet (10 mg total) by mouth at bedtime. 30 tablet 0  . omeprazole (PRILOSEC) 20 MG capsule Take 1 capsule (20 mg total) by mouth daily. 30 capsule 0  . OXYGEN Inhale 7 L into the lungs continuous.     . topiramate (TOPAMAX) 50 MG tablet Take 1 tablet (50 mg total) by mouth daily. 30 tablet 3  . umeclidinium bromide (INCRUSE ELLIPTA) 62.5 MCG/INH AEPB Inhale 1 puff into the lungs daily. 30 each 0  . venlafaxine (EFFEXOR) 37.5 MG tablet Take 37.5 mg by mouth daily with breakfast. Take with a 150 mg ER capsule every morning    . venlafaxine XR (EFFEXOR-XR) 150 MG 24 hr capsule Take 1 capsule (150 mg total) by mouth daily with breakfast. Take 150mg  capsule with 37.5mg  tablet (Patient taking differently: Take 150 mg by mouth daily with breakfast. Take with a 37.5 mg tablet every morning)    . guaiFENesin-dextromethorphan (ROBITUSSIN DM) 100-10 MG/5ML syrup Take 5 mLs by mouth every 4 (four) hours as needed for cough. (Patient not taking: Reported on 08/31/2019) 118 mL 0  . polyethylene glycol (MIRALAX / GLYCOLAX) 17 g packet Take 17 g by mouth daily as needed for mild constipation. (Patient not taking: Reported on 08/09/2019) 14 each 0  . potassium chloride SA (KLOR-CON) 20 MEQ tablet Take 2 tablets (40 mEq total) by mouth 2 (two) times daily. (Patient not taking: Reported on 08/31/2019) 120 tablet 0  . VENTOLIN HFA 108 (90 Base) MCG/ACT inhaler INHALE 2 PUFFS INTO THE LUNGS EVERY 6 HOURS AS NEEDED FOR WHEEZING OR SHORTNESS OF BREATH (Patient taking differently: Inhale 2 puffs into the lungs every 6 (six)  hours as needed for wheezing or shortness of breath. ) 18 g 0    Objective: BP (!) 118/45   Pulse 88   Temp 98.1 F (36.7 C) (Oral)   Resp (!) 28   Ht 5\' 5"  (1.651 m)   Wt (!) 164.4 kg   SpO2 97%   BMI 60.31 kg/m  Physical Exam  Constitutional:  Obese, in mild distress, short of breath while talking  HENT:  Head: Normocephalic and atraumatic.  Eyes: EOM are normal. Right eye exhibits discharge.  Cardiovascular: Normal rate, regular rhythm and normal heart sounds. Exam reveals no gallop and no friction rub.  No murmur heard. Pulmonary/Chest: She is in respiratory distress. She has wheezes. She has no rales. She exhibits no tenderness.  Increased work of breathing  Abdominal: Soft. Bowel sounds are normal. She exhibits no distension. There is no abdominal tenderness.  Musculoskeletal:        General: Tenderness (Bilateral lower extremity) and edema (Trace) present. No deformity.     Cervical back: Normal range of motion and neck supple.  Lymphadenopathy:    She has no cervical adenopathy.  Neurological: She is alert.  Cranial nerves grossly intact  Skin: Skin is warm and dry. No rash noted. She is not diaphoretic. No erythema.  Psychiatric: Affect normal.    Labs and Imaging: CBC BMET  Recent Labs  Lab 08/31/19 0930 08/31/19 0930 08/31/19 1243  WBC 6.9  --   --   HGB 9.7*   < > 11.2*  HCT 35.0*   < > 33.0*  PLT 473*  --   --    < > = values in this interval not displayed.  Recent Labs  Lab 08/31/19 0930 08/31/19 0930 08/31/19 1243  NA 143   < > 142  K 2.9*   < > 2.9*  CL 102  --   --   CO2 31  --   --   BUN 8  --   --   CREATININE 0.56  --   --   GLUCOSE 107*  --   --   CALCIUM 8.9  --   --    < > = values in this interval not displayed.     BNP-40  EKG: Normal sinus rhythm with considerable amount of artifact due to movement  DG Chest Portable 1 View  Result Date: 08/31/2019 CLINICAL DATA:  Shortness of breath EXAM: PORTABLE CHEST 1 VIEW  COMPARISON:  None. FINDINGS: Low lung volumes. Stable elevation of the right hemidiaphragm. Probable bibasilar atelectasis. No significant pleural effusion. No pneumothorax. Stable cardiomediastinal contours. IMPRESSION: Low lung volumes with probable bibasilar atelectasis. Electronically Signed   By: Macy Mis M.D.   On: 08/31/2019 10:33     Gifford Shave, MD 08/31/2019, 2:09 PM PGY-1, [facility] pager: [phone], text pages welcome   I have examined patient with Dr. Caron Presume and Dr. Nori Riis. Plan was formulated together.  Doristine Mango, DO PGY-2 FM

## 2019-09-01 LAB — GLUCOSE, CAPILLARY
Glucose-Capillary: 130 mg/dL — ABNORMAL HIGH (ref 70–99)
Glucose-Capillary: 137 mg/dL — ABNORMAL HIGH (ref 70–99)
Glucose-Capillary: 175 mg/dL — ABNORMAL HIGH (ref 70–99)
Glucose-Capillary: 181 mg/dL — ABNORMAL HIGH (ref 70–99)

## 2019-09-01 LAB — MAGNESIUM: Magnesium: 1.6 mg/dL — ABNORMAL LOW (ref 1.7–2.4)

## 2019-09-01 LAB — BASIC METABOLIC PANEL
Anion gap: 11 (ref 5–15)
BUN: 6 mg/dL — ABNORMAL LOW (ref 8–23)
CO2: 30 mmol/L (ref 22–32)
Calcium: 8.7 mg/dL — ABNORMAL LOW (ref 8.9–10.3)
Chloride: 102 mmol/L (ref 98–111)
Creatinine, Ser: 0.59 mg/dL (ref 0.44–1.00)
GFR calc Af Amer: >60 mL/min (ref >60)
GFR calc non Af Amer: >60 mL/min (ref >60)
Glucose, Bld: 196 mg/dL — ABNORMAL HIGH (ref 70–99)
Potassium: 3.9 mmol/L (ref 3.5–5.1)
Sodium: 143 mmol/L (ref 135–145)

## 2019-09-01 LAB — CBC
HCT: 37.7 % (ref 36.0–46.0)
Hemoglobin: 10.5 g/dL — ABNORMAL LOW (ref 12.0–15.0)
MCH: 24.2 pg — ABNORMAL LOW (ref 26.0–34.0)
MCHC: 27.9 g/dL — ABNORMAL LOW (ref 30.0–36.0)
MCV: 87.1 fL (ref 80.0–100.0)
Platelets: 545 10*3/uL — ABNORMAL HIGH (ref 150–400)
RBC: 4.33 MIL/uL (ref 3.87–5.11)
RDW: 21.8 % — ABNORMAL HIGH (ref 11.5–15.5)
WBC: 7.8 10*3/uL (ref 4.0–10.5)
nRBC: 0 % (ref 0.0–0.2)

## 2019-09-01 LAB — SARS CORONAVIRUS 2 (TAT 6-24 HRS): SARS Coronavirus 2: NEGATIVE

## 2019-09-01 MED ORDER — FUROSEMIDE 40 MG PO TABS
40.0000 mg | ORAL_TABLET | Freq: Every day | ORAL | Status: DC
  Administered 2019-09-02 – 2019-09-03 (×2): 40 mg via ORAL
  Filled 2019-09-01 (×2): qty 1

## 2019-09-01 MED ORDER — IPRATROPIUM-ALBUTEROL 0.5-2.5 (3) MG/3ML IN SOLN: 3.0000 mL | Freq: Three times a day (TID) | RESPIRATORY_TRACT | Status: DC

## 2019-09-01 MED ORDER — IPRATROPIUM-ALBUTEROL 0.5-2.5 (3) MG/3ML IN SOLN
3.0000 mL | Freq: Three times a day (TID) | RESPIRATORY_TRACT | Status: DC
  Administered 2019-09-01 – 2019-09-02 (×4): 3 mL via RESPIRATORY_TRACT
  Filled 2019-09-01 (×4): qty 3

## 2019-09-01 MED ORDER — FLUTICASONE PROPIONATE 50 MCG/ACT NA SUSP
2.0000 | Freq: Every day | NASAL | Status: DC
  Administered 2019-09-01 – 2019-09-03 (×3): 2 via NASAL
  Filled 2019-09-01: qty 16

## 2019-09-01 MED ORDER — FUROSEMIDE 40 MG PO TABS: 40.0000 mg | ORAL_TABLET | Freq: Every day | ORAL | Status: DC

## 2019-09-01 MED ORDER — CYCLOBENZAPRINE HCL 10 MG PO TABS
5.0000 mg | ORAL_TABLET | Freq: Every evening | ORAL | Status: DC | PRN
  Administered 2019-09-01: 5 mg via ORAL
  Filled 2019-09-01: qty 1

## 2019-09-01 MED ORDER — MOMETASONE FURO-FORMOTEROL FUM 100-5 MCG/ACT IN AERO
2.0000 | INHALATION_SPRAY | Freq: Two times a day (BID) | RESPIRATORY_TRACT | Status: DC
  Administered 2019-09-01 – 2019-09-03 (×5): 2 via RESPIRATORY_TRACT

## 2019-09-01 MED ORDER — AZELASTINE HCL 0.1 % NA SOLN
2.0000 | Freq: Every day | NASAL | Status: DC
  Administered 2019-09-01 – 2019-09-03 (×3): 2 via NASAL
  Filled 2019-09-01: qty 30

## 2019-09-01 MED ORDER — MAGNESIUM SULFATE 2 GM/50ML IV SOLN
2.0000 g | Freq: Once | INTRAVENOUS | Status: AC
  Administered 2019-09-01: 2 g via INTRAVENOUS
  Filled 2019-09-01: qty 50

## 2019-09-01 NOTE — Discharge Summary (Signed)
[facility] Discharge Summary  Patient name: [name] White Medical record number: [id number] Date of birth: 08/05/1955 Age: 64 y.o. Gender: female Date of Admission: 08/31/2019  Date of Discharge: 09/03/2019 Admitting Physician: Richarda Osmond, DO  Primary Care Provider: [name], MD Consultants: None  Indication for Hospitalization: Acute on Chronic Hypoxic Hypercarbic Respiratory Failure  Discharge Diagnoses/Problem List:  Acute on chronic hypoxic hypercarbic respiratory failure Hypokalemia, hypomagnesemia Chronic anemia HFpEF History of vocal cord dysfunction History of PE Diabetes PAF Hypertension OSA/OHS Morbid obesity MDD/anxiety GERD Disposition: home with Home Health  Discharge Condition: stable, improved  Discharge Exam:   General: Well-appearing 64 year old female, cheerful  Pulm: Examination limited due to patient's body habitus however bibasal crackles and mild wheeze throughout lung fields Abdomen: Bowel sounds normal. Abdomen soft and non-tender.  Extremities: Mild 1+ pitting edema of ankles, warm/ well perfused.  strong radial pulse Neuro: Cranial nerves grossly intact  Brief Hospital Course:  [name] White a 64 y.o.femalepresenting with desaturations. PMH is significant for MDD, morbid obesity, HTN, GERD, overactive bladder, osteoarthritis of both knees, a flutter, paroxysmal A. fib, chronic anticoagulation, OSA, history of PE,HFpEF, DM.  Her hospital course as outlined below.  Acute on chronic hypoxic, hypercarbic respiratory failure Patient presented with 3 days increased shortness of breath.  Found to be satting at 96% on 2 L O2.  Patient had reported desaturation to 84% on 5 L with ambulation.  Patient was admitted for further treatment of this.  She received IV Lasix for possible CHF exacerbation and steroids for COPD exacerbation as she was wheezing significantly on admission.  She was also treated with  scheduled DuoNebs.  Given her history of PE, CTA was performed that was negative for PE, did show bibasilar atelectasis vs infilatrates which could be indicating the sequelae of recent pneumonia from d/c on 2/15.  The day after admission, patient was having improvement in her breathing.  Oxygen requirement continued to improve, and remain improved from her outpatient baseline most recently at 7 L.  Patient's steroids were discontinued after dose on 3/7 given significant improvement and no history of COPD on PFTs.  As per Pulmonology consultation on 12/26 during previous hospitalization, recommended avoiding antibiotics and steroids in this patient.  It is hypothesized that her numerous admissions and underlying lung problems are 2/2 OHS/OSA and not being treated at home.  Per Dr. [name] Piper note on 12/26 "I do not believe that she has significant reactive airways."  She did not appear fluid overloaded on repeat examination and BNP was 40, therefore, she was returned to her home dose of Lasix.  She remained afebrile with stable vitals throughout her hospitalization and did not require antibiotics.  At the time of discharge she was sating comfortably on 5L.    The patient's other chronic medical issues were addressed as needed throughout her hospitalization without change to her chronic regimen.  Issues for Follow Up:  1. Ensure that patient follows up with pulmonology.   2. In the future, would limit use of steroids and antibiotics given that she DOES NOT have a diagnosis of COPD per her PFTs and prior pulmonology consultations. 3. Ensure use of BiPAP QHS. 4. Recheck BMP and Mag on discharge as patient had these repleted during hospitalization.  Significant Procedures: None  Significant Labs and Imaging:  Recent Labs  Lab 09/01/19 0038 09/02/19 0227 09/03/19 0246  WBC 7.8 11.8* 10.1  HGB 10.5* 9.7* 9.7*  HCT 37.7 35.5* 35.0*  PLT 545* 544*  531*   Recent Labs  Lab 08/31/19 0930 08/31/19 0930  08/31/19 1243 08/31/19 1243 08/31/19 1854 08/31/19 1854 09/01/19 0038 09/01/19 0038 09/02/19 0227 09/03/19 0246  NA 143   < > 142  --  142  --  143  --  142 142  K 2.9*   < > 2.9*   < > 3.2*   < > 3.9   < > 3.6 3.5  CL 102  --   --   --  99  --  102  --  99 96*  CO2 31  --   --   --  31  --  30  --  34* 37*  GLUCOSE 107*  --   --   --  199*  --  196*  --  143* 101*  BUN 8  --   --   --  7*  --  6*  --  11 14  CREATININE 0.56  --   --   --  0.63  --  0.59  --  0.71 0.64  CALCIUM 8.9  --   --   --  8.8*  --  8.7*  --  8.9 8.7*  MG 1.6*  --   --   --   --   --  1.6*  --  2.4  --   PHOS  --   --   --   --   --   --   --   --   --  2.2*   < > = values in this interval not displayed.    CT ANGIO CHEST PE W OR WO CONTRAST  Result Date: 08/31/2019 CLINICAL DATA:  64 year old female with shortness of breath and history of COPD. Concern for pulmonary embolism. EXAM: CT ANGIOGRAPHY CHEST WITH CONTRAST TECHNIQUE: Multidetector CT imaging of the chest was performed using the standard protocol during bolus administration of intravenous contrast. Multiplanar CT image reconstructions and MIPs were obtained to evaluate the vascular anatomy. CONTRAST:  27mL OMNIPAQUE IOHEXOL 350 MG/ML SOLN COMPARISON:  Chest radiograph dated 08/31/2019 and CT dated 07/21/2019. FINDINGS: Evaluation of this exam is limited due to respiratory motion artifact. Cardiovascular: There is mild to moderate cardiomegaly. No pericardial effusion. There is mild atherosclerotic calcification of the aortic arch. No aneurysmal dilatation or dissection. The origins of the great vessels of the aortic arch appear patent as visualized. Mild dilatation of the main pulmonary trunk suggestive of a degree of pulmonary hypertension. Clinical correlation is recommended. Evaluation of the pulmonary arteries is limited due to respiratory motion artifact and streak artifact caused by patient's arms. No large central pulmonary artery embolus identified.  Mediastinum/Nodes: There is no hilar or mediastinal adenopathy. The esophagus is grossly unremarkable. Postsurgical changes of thyroid resection. No mediastinal fluid collection. Lungs/Pleura: Bibasilar subsegmental and subpleural densities may represent atelectasis although infiltrate is not excluded. There is an area of subsegmental consolidation in the right upper lobe along the major fissure which may also represent atelectasis or infiltrate. Clinical correlation is recommended. No pleural effusion or pneumothorax. The central airways are patent. Upper Abdomen: Small ill-defined hypodense lesion in the left lobe of the liver is not characterized. Musculoskeletal: Degenerative changes of the spine. Osteopenia. No acute osseous pathology. Review of the MIP images confirms the above findings. IMPRESSION: 1. No CT evidence of central pulmonary artery embolus. 2. Bibasilar subsegmental and subpleural densities may represent atelectasis although infiltrate is not excluded. Clinical correlation is recommended. 3. Cardiomegaly. 4. Aortic Atherosclerosis (ICD10-I70.0). Electronically Signed  By: Anner Crete M.D.   On: 08/31/2019 16:20   DG Chest Portable 1 View  Result Date: 08/31/2019 CLINICAL DATA:  Shortness of breath EXAM: PORTABLE CHEST 1 VIEW COMPARISON:  None. FINDINGS: Low lung volumes. Stable elevation of the right hemidiaphragm. Probable bibasilar atelectasis. No significant pleural effusion. No pneumothorax. Stable cardiomediastinal contours. IMPRESSION: Low lung volumes with probable bibasilar atelectasis. Electronically Signed   By: Macy Mis M.D.   On: 08/31/2019 10:33   Results/Tests Pending at Time of Discharge: None  Discharge Medications:  Allergies as of 09/03/2019      Reactions   Caffeine Other (See Comments)   Migraine   Hydralazine Hcl Other (See Comments)   Other reaction(s): Other (See Comments) AKI leading to rhabdo and electrolyte abnormalities   Hydrocodone Nausea And  Vomiting   Headache   Ciprofloxacin Hives, Rash   Erythromycin Hives, Rash   Lisinopril Cough   Oxycodone Nausea And Vomiting   Headache   Sulfamethoxazole-trimethoprim Rash      Medication List    STOP taking these medications   potassium chloride SA 20 MEQ tablet Commonly known as: KLOR-CON     TAKE these medications   acetaminophen 325 MG tablet Commonly known as: TYLENOL Take 2 tablets (650 mg total) by mouth every 6 (six) hours as needed for mild pain.   albuterol (2.5 MG/3ML) 0.083% nebulizer solution Commonly known as: PROVENTIL Take 3 mLs (2.5 mg total) by nebulization every 6 (six) hours as needed for wheezing or shortness of breath. What changed: Another medication with the same name was changed. Make sure you understand how and when to take each.   Ventolin HFA 108 (90 Base) MCG/ACT inhaler Generic drug: albuterol INHALE 2 PUFFS INTO THE LUNGS EVERY 6 HOURS AS NEEDED FOR WHEEZING OR SHORTNESS OF BREATH What changed: See the new instructions.   apixaban 5 MG Tabs tablet Commonly known as: ELIQUIS Take 1 tablet (5 mg total) by mouth 2 (two) times daily.   atorvastatin 40 MG tablet Commonly known as: LIPITOR Take 1 tablet (40 mg total) by mouth daily at 6 PM.   azelastine 0.05 % ophthalmic solution Commonly known as: OPTIVAR Place 1 drop into both eyes 2 (two) times daily as needed (itchy eyes).   azelastine 0.1 % nasal spray Commonly known as: ASTELIN Place 2 sprays into both nostrils 2 (two) times daily. Use in each nostril as directed What changed:   when to take this  additional instructions   cyclobenzaprine 5 MG tablet Commonly known as: FLEXERIL Take 1 tablet (5 mg total) by mouth 2 (two) times daily as needed for muscle spasms.   cycloSPORINE 0.05 % ophthalmic emulsion Commonly known as: RESTASIS Place 1 drop into both eyes 2 (two) times daily.   diclofenac sodium 1 % Gel Commonly known as: VOLTAREN APPLY 2 GRAMS EXTERNALLY TO THE  AFFECTED AREA FOUR TIMES DAILY What changed: See the new instructions.   diltiazem 120 MG 24 hr capsule Commonly known as: CARDIZEM CD Take 1 capsule (120 mg total) by mouth daily. What changed: when to take this   famotidine 20 MG tablet Commonly known as: PEPCID Take 1 tablet (20 mg total) by mouth daily.   ferrous sulfate 325 (65 FE) MG tablet Take 1 tablet (325 mg total) by mouth daily with breakfast.   flecainide 100 MG tablet Commonly known as: TAMBOCOR TAKE 1 TABLET BY MOUTH 2 TIMES A DAY. PLEASE KEEP UPCOMING APPOINTMENT IN JANUARY BEFORE ANYMORE REFILLS.   fluticasone 50  MCG/ACT nasal spray Commonly known as: FLONASE Place 2 sprays into both nostrils daily.   furosemide 40 MG tablet Commonly known as: LASIX Take 40 mg by mouth daily.   gabapentin 100 MG capsule Commonly known as: NEURONTIN Take 2 capsules (200 mg total) by mouth 3 (three) times daily.   guaiFENesin-dextromethorphan 100-10 MG/5ML syrup Commonly known as: ROBITUSSIN DM Take 5 mLs by mouth every 4 (four) hours as needed for cough.   hydrOXYzine 25 MG capsule Commonly known as: VISTARIL Take 1 capsule (25 mg total) by mouth 2 (two) times daily as needed for anxiety.   ipratropium-albuterol 0.5-2.5 (3) MG/3ML Soln Commonly known as: DUONEB Take 3 mLs by nebulization every 12 (twelve) hours as needed (shortness of breath, wheezing).   loratadine 10 MG tablet Commonly known as: CLARITIN TAKE 1 TABLET(10 MG) BY MOUTH DAILY   Melatonin 3 MG Tabs Take 1 tablet (3 mg total) by mouth at bedtime as needed (sleep). What changed: when to take this   metoprolol tartrate 25 MG tablet Commonly known as: LOPRESSOR Take 0.5 tablets (12.5 mg total) by mouth 2 (two) times daily.   mometasone-formoterol 100-5 MCG/ACT Aero Commonly known as: DULERA Inhale 2 puffs into the lungs 2 (two) times daily.   montelukast 10 MG tablet Commonly known as: SINGULAIR Take 1 tablet (10 mg total) by mouth at bedtime.    omeprazole 20 MG capsule Commonly known as: PRILOSEC Take 1 capsule (20 mg total) by mouth daily.   OXYGEN Inhale 7 L into the lungs continuous.   polyethylene glycol 17 g packet Commonly known as: MIRALAX / GLYCOLAX Take 17 g by mouth daily as needed for mild constipation.   topiramate 50 MG tablet Commonly known as: TOPAMAX Take 1 tablet (50 mg total) by mouth daily.   umeclidinium bromide 62.5 MCG/INH Aepb Commonly known as: INCRUSE ELLIPTA Inhale 1 puff into the lungs daily.   venlafaxine 37.5 MG tablet Commonly known as: EFFEXOR Take 37.5 mg by mouth daily with breakfast. Take with a 150 mg ER capsule every morning What changed: Another medication with the same name was changed. Make sure you understand how and when to take each.   venlafaxine XR 150 MG 24 hr capsule Commonly known as: EFFEXOR-XR Take 1 capsule (150 mg total) by mouth daily with breakfast. Take 150mg  capsule with 37.5mg  tablet What changed: additional instructions       Discharge Instructions: Please refer to Patient Instructions section of EMR for full details.  Patient was counseled important signs and symptoms that should prompt return to medical care, changes in medications, dietary instructions, activity restrictions, and follow up appointments.   Follow-Up Appointments: Follow-up Information    [name], MD. Go on 09/06/2019.   Specialty: Family Medicine Why: Go to appointment at [facility] Medicine clinic at 1.30pm on 12th March. Please arrive 15 mins early.  Contact information: [address] [place] [address] [phone]        [facility] Follow up.   Specialty: Home Health Services Why: Someone will contact you to schedule your first visit.  Contact information: [address] [place] [address] [phone]           Lattie Haw, MD 09/03/2019, 3:14 PM PGY-1, [place]

## 2019-09-01 NOTE — Evaluation (Signed)
Physical Therapy Evaluation Patient Details Name: [name] White MRN: [id number] DOB: 05-13-1956 Today's Date: 09/01/2019   History of Present Illness  64 y.o. female presenting with desaturations . PMH is significant for MDD, morbid obesity, HTN, GERD, overactive bladder, osteoarthritis of both knees, a flutter, paroxysmal A. fib, chronic anticoagulation, OSA, history of PE,HFpEF, DM.    Clinical Impression  Pt admitted with above diagnosis. On eval, pt required min assist bed mobility and min guard assist transfers. Pt maintaining SpO2 at 90-93% on 4L. Pt currently with functional limitations due to the deficits listed below (see PT Problem List). Pt will benefit from skilled PT to increase their independence and safety with mobility to allow discharge to the venue listed below.       Follow Up Recommendations Home health PT;Supervision for mobility/OOB    Equipment Recommendations  None recommended by PT    Recommendations for Other Services       Precautions / Restrictions Precautions Precautions: Fall;Other (comment) Precaution Comments: watch o2 sats      Mobility  Bed Mobility Overal bed mobility: Needs Assistance Bed Mobility: Supine to Sit;Sit to Supine     Supine to sit: HOB elevated;Min assist Sit to supine: Min assist;HOB elevated   General bed mobility comments: +rail, assist to elevate trunk, assist with BLE back into bed  Transfers Overall transfer level: Needs assistance Equipment used: None Transfers: Sit to/from [facility] Sit to Stand: Min guard Stand pivot transfers: Min guard       General transfer comment: min guard for safety, pivot steps bed <> BSC  Ambulation/Gait                Stairs            Wheelchair Mobility    Modified Rankin (Stroke Patients Only)       Balance Overall balance assessment: Needs assistance Sitting-balance support: No upper extremity supported;Feet supported Sitting  balance-Leahy Scale: Good     Standing balance support: Bilateral upper extremity supported;During functional activity;Single extremity supported Standing balance-Leahy Scale: Poor Standing balance comment: reliant on UE support                             Pertinent Vitals/Pain Pain Assessment: No/denies pain    Home Living Family/patient expects to be discharged to:: Private residence Living Arrangements: Other relatives(niece) Available Help at Discharge: Family;Available PRN/intermittently;Other (Comment) Type of Home: Apartment Home Access: Stairs to enter Entrance Stairs-Rails: None Entrance Stairs-Number of Steps: 1 Home Layout: One level Home Equipment: Walker - 2 wheels;Cane - single point;Walker - 4 wheels;Hospital bed;Other (comment);Tub bench;Bedside commode;Wheelchair - manual Additional Comments: lives with niece. HHA a couple hours a day    Prior Function Level of Independence: Needs assistance   Gait / Transfers Assistance Needed: transfers bed to [facility] and wheelchair  ADL's / [place] Needed: wears pull up and uses bed side bath, uses 3n1 for bowel movement only   Comments: was ambulating short household distances with RW approx 1-2 months ago     Hand Dominance   Dominant Hand: Right    Extremity/Trunk Assessment   Upper Extremity Assessment Upper Extremity Assessment: Defer to OT evaluation    Lower Extremity Assessment Lower Extremity Assessment: Overall WFL for tasks assessed    Cervical / Trunk Assessment Cervical / Trunk Assessment: Other exceptions Cervical / Trunk Exceptions: morbid obesity  Communication   Communication: No difficulties  Cognition Arousal/Alertness: Awake/alert Behavior During  Therapy: WFL for tasks assessed/performed Overall Cognitive Status: Within Functional Limits for tasks assessed                                        General Comments General comments (skin integrity,  edema, etc.): Pt on 3L O2 on arrival. Desat to 80% during mobility. O2 increased to 4L, improved to 93% with deep breathing. Pt remained on 4L at end of session.    Exercises     Assessment/Plan    PT Assessment Patient needs continued PT services  PT Problem List Decreased activity tolerance;Decreased balance;Decreased mobility;Cardiopulmonary status limiting activity       PT Treatment Interventions DME instruction;Gait training;Functional mobility training;Therapeutic activities;Stair training;Therapeutic exercise;Balance training;Neuromuscular re-education;Patient/family education;Wheelchair mobility training    PT Goals (Current goals can be found in the Care Plan section)  Acute Rehab PT Goals Patient Stated Goal: home PT Goal Formulation: With patient Time For Goal Achievement: 09/15/19 Potential to Achieve Goals: Good    Frequency Min 3X/week   Barriers to discharge        Co-evaluation               AM-PAC PT "6 Clicks" Mobility  Outcome Measure Help needed turning from your back to your side while in a flat bed without using bedrails?: A Little Help needed moving from lying on your back to sitting on the side of a flat bed without using bedrails?: A Little Help needed moving to and from a bed to a chair (including a wheelchair)?: A Little Help needed standing up from a chair using your arms (e.g., wheelchair or bedside chair)?: A Little Help needed to walk in hospital room?: A Lot Help needed climbing 3-5 steps with a railing? : Total 6 Click Score: 15    End of Session Equipment Utilized During Treatment: Oxygen Activity Tolerance: Patient tolerated treatment well Patient left: in bed;with call bell/phone within reach Nurse Communication: Mobility status PT Visit Diagnosis: Muscle weakness (generalized) (M62.81);Other abnormalities of gait and mobility (R26.89)    Time: [id number] PT Time Calculation (min) (ACUTE ONLY): 30 min   Charges:   PT  Evaluation $PT Eval Moderate Complexity: 1 Mod PT Treatments $Therapeutic Activity: 8-22 mins        Lorrin Goodell, PT  Office # [phone] Pager [phone]   Lorriane Shire 09/01/2019, 2:23 PM

## 2019-09-01 NOTE — Progress Notes (Signed)
Occupational Therapy Evaluation Patient Details Name: [name] White MRN: [id number] DOB: 31-Jan-1956 Today's Date: 09/01/2019    History of Present Illness 64 y.o. female presenting with desaturations . PMH is significant for MDD, morbid obesity, HTN, GERD, overactive bladder, osteoarthritis of both knees, a flutter, paroxysmal A. fib, chronic anticoagulation, OSA, history of PE,HFpEF, DM.   Clinical Impression   PTA, pt lived at home with her niece, who works during the day. Niece assists with bathing and pericare as needed. Pt states that she has become weaker at home and is unable to ambulate out of her bedroom and primarily uses the [facility] . Pt has a wc, but it will not fit through her bedroom door, therefore, pt primarily stays in her bedroom. Pt states that she was walking a few months ago after she had rehab. Feel pt would benefit from rehab at SNF to increase independence with ADL and mobility prior to return home. Pt is in agreement for rehab if eligible. Will follow acutely.     Follow Up Recommendations  SNF;Supervision/Assistance - 24 hour    Equipment Recommendations  None recommended by OT    Recommendations for Other Services       Precautions / Restrictions Precautions Precautions: Fall;Other (comment) Precaution Comments: watch o2 sats      Mobility Bed Mobility Overal bed mobility: Needs Assistance Bed Mobility: Supine to Sit     Supine to sit: HOB elevated;Supervision     Transfers Overall transfer level: Needs assistance Equipment used: None Transfers: Squat Pivot Transfers Sit to Stand: Min guard Stand pivot transfers: Min guard Squat pivot transfers: Min guard         Balance Overall balance assessment: Needs assistance Sitting-balance support: No upper extremity supported;Feet supported Sitting balance-Leahy Scale: Good     Standing balance support: Bilateral upper extremity supported;During functional activity;Single extremity  supported Standing balance-Leahy Scale: Poor Standing balance comment: reliant on UE support                           ADL either performed or assessed with clinical judgement   ADL Overall ADL's : Needs assistance/impaired     Grooming: Set up;Sitting   Upper Body Bathing: Set up;Sitting   Lower Body Bathing: Moderate assistance;Sit to/from stand   Upper Body Dressing : Set up;Sitting   Lower Body Dressing: Moderate assistance;Sit to/from stand   Toilet Transfer: Squat-pivot;Min guard   Toileting- Water quality scientist and Hygiene: Moderate assistance;Sitting/lateral lean;Sit to/from stand       Functional mobility during ADLs: Min guard(stand/squat pivot only. not ambulating) General ADL Comments: would benefit form toilet aid     Vision         Perception     Praxis      Pertinent Vitals/Pain Pain Assessment: No/denies pain     Hand Dominance Right   Extremity/Trunk Assessment Upper Extremity Assessment Upper Extremity Assessment: Generalized weakness   Lower Extremity Assessment Lower Extremity Assessment: Defer to PT evaluation   Cervical / Trunk Assessment Cervical / Trunk Assessment: Other exceptions(morbid obesity) Cervical / Trunk Exceptions: morbid obesity   Communication Communication Communication: No difficulties   Cognition Arousal/Alertness: Awake/alert Behavior During Therapy: WFL for tasks assessed/performed Overall Cognitive Status: Within Functional Limits for tasks assessed                                     General Comments  Pt on 4L O2 during session and able to maintain SpO2 in low 90s with minimal SOB     Exercises Other Exercises Other Exercises: encouraged bed level exercises for legs arms as tolerated. Educated importance of completeing BUE shoulder flexion/elbow flex/ext adn heel slides; leg lifts   Shoulder Instructions      Home Living Family/patient expects to be discharged to:: Private  residence Living Arrangements: Other relatives Available Help at Discharge: Family;Available PRN/intermittently;Other (Comment) Type of Home: Apartment Home Access: Stairs to enter Entrance Stairs-Number of Steps: 1 Entrance Stairs-Rails: None Home Layout: One level     Bathroom Shower/Tub: Teacher, early years/pre: Standard Bathroom Accessibility: Yes How Accessible: Accessible via walker Home Equipment: [place] - 2 wheels;Walker - 4 wheels;Cane - single point;Wheelchair - [employer];Tub bench Adaptive Equipment: Reacher;Sock aid;Long-handled shoe horn;Long-handled sponge Additional Comments: lives with niece. In process of trying to set up HHA with her case worker      Prior Functioning/Environment Level of Independence: Needs assistance  Gait / Transfers Assistance Needed: transfers bed to [facility] and wheelchair ADL's / Homemaking Assistance Needed: niece fixes her food/does IADL tasks; helps her bath and get dressed   Comments: was ambulating short household distances with RW approx 1-2 months ago        OT Problem List: Decreased strength;Decreased activity tolerance;Impaired balance (sitting and/or standing);Decreased safety awareness;Decreased knowledge of use of DME or AE;Cardiopulmonary status limiting activity;Obesity      OT Treatment/Interventions: Self-care/ADL training;Therapeutic exercise;Energy conservation;DME and/or AE instruction;Therapeutic activities;Patient/family education;Balance training    OT Goals(Current goals can be found in the care plan section) Acute Rehab OT Goals Patient Stated Goal: to get some rehab OT Goal Formulation: With patient Time For Goal Achievement: 09/15/19 Potential to Achieve Goals: Good  OT Frequency: Min 2X/week   Barriers to D/C:            Co-evaluation              AM-PAC OT "6 Clicks" Daily Activity     Outcome Measure Help from another person eating meals?: None Help from another person  taking care of personal grooming?: A Little Help from another person toileting, which includes using toliet, bedpan, or urinal?: A Lot Help from another person bathing (including washing, rinsing, drying)?: A Lot Help from another person to put on and taking off regular upper body clothing?: A Little Help from another person to put on and taking off regular lower body clothing?: A Lot 6 Click Score: 16   End of Session Equipment Utilized During Treatment: Oxygen(4L) Nurse Communication: Mobility status  Activity Tolerance: Patient tolerated treatment well Patient left: in chair;with call bell/phone within reach;with chair alarm set  OT Visit Diagnosis: Unsteadiness on feet (R26.81);Muscle weakness (generalized) (M62.81)                Time: 1710-1743 OT Time Calculation (min): 33 min Charges:  OT General Charges $OT Visit: 1 Visit OT Evaluation $OT Eval Moderate Complexity: 1 Mod OT Treatments $Self Care/Home Management : 8-22 mins  Maurie Boettcher, OT/L   Acute OT Clinical Specialist [place] Pager [phone] Office [phone]   [facility] 09/01/2019, 5:55 PM

## 2019-09-01 NOTE — Plan of Care (Signed)
  Problem: Respiratory: Goal: Levels of oxygenation will improve Outcome: Progressing   

## 2019-09-01 NOTE — Progress Notes (Addendum)
Family Medicine Teaching Service Daily Progress Note Intern Pager: [phone]  Patient name: [name] White Medical record number: [id number] Date of birth: 07-22-1955 Age: 64 y.o. Gender: female  Primary Care Provider: [name], MD Consultants: None Code Status: DNR Preferred Emergency Contact: niece Jonelle Sidle [phone])  Pt Overview and Major Events to Date:  3/6 Admitted to [place] for SOB; CTA chest: negative for PE, possible atelectasis versus infiltrates indicating sequela of recent pneumonia  Assessment and Plan: [name] White is a 63 y.o. female presenting with desaturations . PMH is significant for MDD, morbid obesity, HTN, GERD, overactive bladder, osteoarthritis of both knees, a flutter, paroxysmal A. fib, chronic anticoagulation, OSA, history of PE,HFpEF, DM.  Acute on chronic hypoxic, hypercarbic respiratory failure: As per Pulmonology consultation on 12/26 during previous hospitalization, recommended avoiding antibiotics and steroids in this patient.  It is hypothesized that her numerous admissions and underlying lung problems are 2/2 OHS/OSA and not being treated at home.  Per Dr. [name] Piper note on 12/26 "I do not believe that she has significant reactive airways."  Patient has been previously scheduled for outpatient sleep study, but do not see where this has been completed.  PFTs on 07/18/18 without evidence of COPD.  Home O2 was 7L, now on 3L on exam.  Home meds: Albuterol 2.5 mg as needed, Incruse Ellipta daily, Singulair, Dulera twice daily, Claritin daily, duo nebs as needed. BNP 40 on admission.  S/P Lasix 40mg  IV x1, UOP 700cc recorded.  Received another dose of IV Lasix 40 this AM.  On BiPAP overnight.  This AM, reports improvement in resp status and denies tightness in chest and throat, which she states she had on admission.  On exam, no wheezing, does have poor air movement throughout basilar lungs, could be 2/2 body habitus.   Does not appear to be fluid  overloaded on exam.  Will return to home Lasix dose.  Would opt to discontinue pred in light of history and previous pulmonology consult.  Does not seem to be that patient is having a COPD exacerbation and PFTs were not previously indicative of underlying COPD. -Albuterol 2.5 mg every 6 hours as needed -Duo nebs scheduled every 8 hours -Dulera 2 puffs twice daily -Singulair 10 mg daily at bedtime -d/c prednisone, received 1 dose this AM -Consider addition of antibiotics if develops fever or concern for infection -restart home Lasix 40mg  QD 3/8, as received IV this AM -PT/OT eval and treat -BiPAP QHS  Hypokalemia: resolved  Hypomagnesemia K 3.2>3.9 this AM. Received 100 mEq of potassium yesterday.  Home meds K-dur 40 mEq x2.  Magnesium 1.6 on admission.  Given IV magnesium sulfate 2 g.  This AM 1.6. - continue to monitor K & Mag - Mag sulfate 2g IV x1 today - replete as necessary   Anemia, chronic: Hgb 10.5, MCV 87.1 (baseline 9-10). No signs of bleeding on exam. - continue to monitor - cont iron supplementation  HFpEF Home meds: Lasix 40mg  QD, K-Dur 40 mEq twice daily, Lopressor 12.5 mg twice daily, diltiazem 120 mg daily.  Last Echo 05/29/2019, EF 60 to 65%, severely increased LVH.  Received Lasix 40 mg IV yesterday, UOP 700cc charted. 1 dose IV lasix 40mg  also given this AM.  Does not appear fluid overloaded on exam. - strict I/O - daily weights - cont to monitor resp status - restart home Lasix dose 40mg  PO QD 3/7, received IV 40mg  this AM   Hx Vocal Cord Dysfunction Known history, frequently believed to be part  of patient's symptoms on presentation. - cont to monitor  History of PE: History of PE with chronic swelling of left lower extremity.  CTA negative on admission for PE.  On Eliquis 5 mg twice daily. - Continue Eliquis 5 mg twice daily  Diabetes: Recently diagnosed on admission in December 2020.  A1c at that time was 7.8.  It is believed that patient may have developed  diabetes in the setting of frequent steroid courses.  She was not discharged on any diabetic medications.  Patient was started on prednisone 40 mg daily on admission, glucose this a.m. 196. - start CBG checks QAC/HS - determine need for SSI pending these results - will d/c patient's steroids today therefore suspect improvement  - can repeat A1c after 3/19  Paroxysmal A. Fib Home medications include digoxin 120 mg nightly, metoprolol 12.5 mg twice daily, flecainide 100 mg, Eliquis. -Continue home medications  History of hypertension On Lopressor 12.5 mg daily and diltiazem 120 mg daily.  BP this AM 114/59. - cont home meds  OSA/OHS Patient on BiPAP at home.  Majority of patient's respiratory problems believed to be secondary to OSA/OHS Per previous pulmonology consults.  Reports that home sleep study never came to the house. -  BiPAP QHS - f/u pulm outpatient  Morbid obesity Weight on admission 164.4 kg with a BMI of 60.31.  This is approximately the same weight as her previous admission. -Encouraged healthy lifestyle and weight management  MDD/anxiety Patient with history of anxiety.  Home medication includes Atarax 25 mg and Effexor 187.5 mg daily -Continue home medications  GERD Patient takes Prilosec 20 mg and Pepcid 20 mg at home -Continue home meds  FEN/GI: Heart healthy PPx: Eliquis  Disposition: pending clinical improvement and PT/OT recs, likely home tomorrow  Subjective:  Patient reports having improvement in her breathing this morning.  States that she does not currently have any complaints.  States that on presentation to the ED, she was having tightness in her chest and her neck.  Reports that she was on 7 L O2 at home.  Also reports that she did not have her sleep study performed as an outpatient because the equipment never arrived at her house.  Objective: Temp:  [98 F (36.7 C)-98.1 F (36.7 C)] 98 F (36.7 C) (03/06 2325) Pulse Rate:  [74-106] 95 (03/06  2325) Resp:  [16-28] 24 (03/06 1813) BP: (93-144)/(45-114) 122/79 (03/06 2325) SpO2:  [84 %-99 %] 95 % (03/06 2325) Weight:  [164.4 kg] 164.4 kg (03/06 0915)  Physical Exam:  General: 64 y.o. female in NAD Cardio: RRR no m/r/g Lungs: no wheezing, poor air movement in basilar fields b/l, on 3L O2 per [place] Abdomen: Soft, non-tender to palpation, non-distended Skin: warm and dry Extremities: No edema  Laboratory: Recent Labs  Lab 08/31/19 0930 08/31/19 1243 09/01/19 0038  WBC 6.9  --  7.8  HGB 9.7* 11.2* 10.5*  HCT 35.0* 33.0* 37.7  PLT 473*  --  545*   Recent Labs  Lab 08/31/19 0930 08/31/19 0930 08/31/19 1243 08/31/19 1854 09/01/19 0038  NA 143   < > 142 142 143  K 2.9*   < > 2.9* 3.2* 3.9  CL 102  --   --  99 102  CO2 31  --   --  31 30  BUN 8  --   --  7* 6*  CREATININE 0.56  --   --  0.63 0.59  CALCIUM 8.9  --   --  8.8* 8.7*  GLUCOSE 107*  --   --  199* 196*   < > = values in this interval not displayed.   BNP 40 Mag 1.6 COVID neg Urinalysis    Component Value Date/Time   COLORURINE YELLOW 08/31/2019 1338   APPEARANCEUR CLEAR 08/31/2019 1338   APPEARANCEUR Clear 10/02/2014 1645   LABSPEC 1.024 08/31/2019 1338   LABSPEC 1.018 10/02/2014 1645   PHURINE 7.0 08/31/2019 1338   GLUCOSEU NEGATIVE 08/31/2019 1338   GLUCOSEU Negative 10/02/2014 1645   HGBUR NEGATIVE 08/31/2019 1338   BILIRUBINUR NEGATIVE 08/31/2019 1338   BILIRUBINUR Negative 10/02/2014 1645   KETONESUR NEGATIVE 08/31/2019 1338   PROTEINUR NEGATIVE 08/31/2019 1338   UROBILINOGEN 1.0 01/31/2014 0745   NITRITE NEGATIVE 08/31/2019 1338   LEUKOCYTESUR NEGATIVE 08/31/2019 1338   LEUKOCYTESUR Negative 10/02/2014 1645    Imaging/Diagnostic Tests: CT ANGIO CHEST PE W OR WO CONTRAST  Result Date: 08/31/2019 CLINICAL DATA:  64 year old female with shortness of breath and history of COPD. Concern for pulmonary embolism. EXAM: CT ANGIOGRAPHY CHEST WITH CONTRAST TECHNIQUE: Multidetector CT imaging of  the chest was performed using the standard protocol during bolus administration of intravenous contrast. Multiplanar CT image reconstructions and MIPs were obtained to evaluate the vascular anatomy. CONTRAST:  72mL OMNIPAQUE IOHEXOL 350 MG/ML SOLN COMPARISON:  Chest radiograph dated 08/31/2019 and CT dated 07/21/2019. FINDINGS: Evaluation of this exam is limited due to respiratory motion artifact. Cardiovascular: There is mild to moderate cardiomegaly. No pericardial effusion. There is mild atherosclerotic calcification of the aortic arch. No aneurysmal dilatation or dissection. The origins of the great vessels of the aortic arch appear patent as visualized. Mild dilatation of the main pulmonary trunk suggestive of a degree of pulmonary hypertension. Clinical correlation is recommended. Evaluation of the pulmonary arteries is limited due to respiratory motion artifact and streak artifact caused by patient's arms. No large central pulmonary artery embolus identified. Mediastinum/Nodes: There is no hilar or mediastinal adenopathy. The esophagus is grossly unremarkable. Postsurgical changes of thyroid resection. No mediastinal fluid collection. Lungs/Pleura: Bibasilar subsegmental and subpleural densities may represent atelectasis although infiltrate is not excluded. There is an area of subsegmental consolidation in the right upper lobe along the major fissure which may also represent atelectasis or infiltrate. Clinical correlation is recommended. No pleural effusion or pneumothorax. The central airways are patent. Upper Abdomen: Small ill-defined hypodense lesion in the left lobe of the liver is not characterized. Musculoskeletal: Degenerative changes of the spine. Osteopenia. No acute osseous pathology. Review of the MIP images confirms the above findings. IMPRESSION: 1. No CT evidence of central pulmonary artery embolus. 2. Bibasilar subsegmental and subpleural densities may represent atelectasis although infiltrate  is not excluded. Clinical correlation is recommended. 3. Cardiomegaly. 4. Aortic Atherosclerosis (ICD10-I70.0). Electronically Signed   By: Anner Crete M.D.   On: 08/31/2019 16:20   DG Chest Portable 1 View  Result Date: 08/31/2019 CLINICAL DATA:  Shortness of breath EXAM: PORTABLE CHEST 1 VIEW COMPARISON:  None. FINDINGS: Low lung volumes. Stable elevation of the right hemidiaphragm. Probable bibasilar atelectasis. No significant pleural effusion. No pneumothorax. Stable cardiomediastinal contours. IMPRESSION: Low lung volumes with probable bibasilar atelectasis. Electronically Signed   By: Macy Mis M.D.   On: 08/31/2019 10:33    Jasmen Emrich, Bernita Raisin, DO 09/01/2019, 8:21 AM PGY-2, [place] Intern pager: [phone], text pages welcome

## 2019-09-02 LAB — BASIC METABOLIC PANEL
Anion gap: 9 (ref 5–15)
BUN: 11 mg/dL (ref 8–23)
CO2: 34 mmol/L — ABNORMAL HIGH (ref 22–32)
Calcium: 8.9 mg/dL (ref 8.9–10.3)
Chloride: 99 mmol/L (ref 98–111)
Creatinine, Ser: 0.71 mg/dL (ref 0.44–1.00)
GFR calc Af Amer: >60 mL/min (ref >60)
GFR calc non Af Amer: >60 mL/min (ref >60)
Glucose, Bld: 143 mg/dL — ABNORMAL HIGH (ref 70–99)
Potassium: 3.6 mmol/L (ref 3.5–5.1)
Sodium: 142 mmol/L (ref 135–145)

## 2019-09-02 LAB — CBC
HCT: 35.5 % — ABNORMAL LOW (ref 36.0–46.0)
Hemoglobin: 9.7 g/dL — ABNORMAL LOW (ref 12.0–15.0)
MCH: 24.4 pg — ABNORMAL LOW (ref 26.0–34.0)
MCHC: 27.3 g/dL — ABNORMAL LOW (ref 30.0–36.0)
MCV: 89.4 fL (ref 80.0–100.0)
Platelets: 544 10*3/uL — ABNORMAL HIGH (ref 150–400)
RBC: 3.97 MIL/uL (ref 3.87–5.11)
RDW: 21.9 % — ABNORMAL HIGH (ref 11.5–15.5)
WBC: 11.8 10*3/uL — ABNORMAL HIGH (ref 4.0–10.5)
nRBC: 0 % (ref 0.0–0.2)

## 2019-09-02 LAB — MAGNESIUM: Magnesium: 2.4 mg/dL (ref 1.7–2.4)

## 2019-09-02 LAB — GLUCOSE, CAPILLARY
Glucose-Capillary: 102 mg/dL — ABNORMAL HIGH (ref 70–99)
Glucose-Capillary: 184 mg/dL — ABNORMAL HIGH (ref 70–99)
Glucose-Capillary: 188 mg/dL — ABNORMAL HIGH (ref 70–99)
Glucose-Capillary: 141 mg/dL — ABNORMAL HIGH (ref 70–99)

## 2019-09-02 MED ORDER — POTASSIUM CHLORIDE CRYS ER 20 MEQ PO TBCR
40.0000 meq | EXTENDED_RELEASE_TABLET | Freq: Once | ORAL | Status: AC
  Administered 2019-09-02: 40 meq via ORAL
  Filled 2019-09-02: qty 2

## 2019-09-02 MED ORDER — CYCLOBENZAPRINE HCL 10 MG PO TABS
5.0000 mg | ORAL_TABLET | Freq: Two times a day (BID) | ORAL | Status: DC | PRN
  Administered 2019-09-03: 5 mg via ORAL
  Filled 2019-09-02: qty 1

## 2019-09-02 MED ORDER — IPRATROPIUM-ALBUTEROL 0.5-2.5 (3) MG/3ML IN SOLN
3.0000 mL | Freq: Two times a day (BID) | RESPIRATORY_TRACT | Status: DC
  Administered 2019-09-02 – 2019-09-03 (×2): 3 mL via RESPIRATORY_TRACT
  Filled 2019-09-02 (×2): qty 3

## 2019-09-02 MED ORDER — DICLOFENAC SODIUM 1 % EX GEL
2.0000 g | Freq: Four times a day (QID) | CUTANEOUS | Status: DC
  Administered 2019-09-02 – 2019-09-03 (×5): 2 g via TOPICAL
  Filled 2019-09-02: qty 100

## 2019-09-02 NOTE — Progress Notes (Addendum)
Family Medicine Teaching Service Daily Progress Note Intern Pager: [phone]  Patient name: [name] White Medical record number: [id number] Date of birth: 09/30/1955 Age: 64 y.o. Gender: female  Primary Care Provider: [name], MD Consultants: None Code Status: DNR Preferred Emergency Contact: niece [name] White [phone])  Pt Overview and Major Events to Date:  3/6 Admitted to [place] for SOB; CTA chest: negative for PE, possible atelectasis versus infiltrates indicating sequela of recent pneumonia  Assessment and Plan: [name] White is a 64 y.o. female presenting with desaturations . PMH is significant for MDD, morbid obesity, HTN, GERD, overactive bladder, osteoarthritis of both knees, a flutter, paroxysmal A. fib, chronic anticoagulation, OSA, history of PE,HFpEF, DM.  Acute on chronic hypoxic, hypercarbic respiratory failure:  OHS/OSA Patient reported chest tightness this morning and said that she does not feel ready to go home. Tolerated the BiPAP well overnight.  States that she was on 7 L O2 at home  Having duo nebs when I was seeing her this morning. Sats 93 to 96% on 3 L oxygen. On examination: Bibasal crackles, poor air entry in bases of lungs, 3 L oxygen per nasal cannula. Status post Lasix IV 40mg  BID  -Albuterol 2.5 mg every 6 hours as needed -Duo nebs scheduled every 8 hours -Dulera 2 puffs twice daily -Singulair 10 mg daily at bedtime -Consider addition of antibiotics if develops fever or concern for infection -restart home Lasix 40mg  today once daily -PT/OT: recommended SNF, possibly discharge today/tomorrow  -BiPAP QHS  Hypokalemia: resolved  Hypomagnesemia K 3.6 today, 3.2>3.9 Mg 2.4 today, 1.6>1.6 Home meds K-dur 40 mEq x2.    - continue to monitor K & Mag - Replete Mag sulfate 2g as needed  -Rreplete as necessary   Anemia, chronic: No evidence of bleeding Hgb 9.7, 10.5>11.2. MCV 87.1 (baseline 9-10).  - continue to monitor - cont iron  supplementation  HFpEF Home meds: Lasix 40mg  QD, K-Dur 40 mEq twice daily, Lopressor 12.5 mg twice daily, diltiazem 120 mg daily.  Last Echo 05/29/2019, EF 60 to 65%, severely increased LVH.  S/p 2X Lasix 40 mg IV  Is: 240cc Out:1600 cc - strict I/O - daily weights - cont to monitor resp status - restart home Lasix dose 40mg  PO today  Hx Vocal Cord Dysfunction Known history, frequently believed to be part of patient's symptoms on presentation. - cont to monitor  History of PE: History of PE with chronic swelling of left lower extremity.  CTA negative on admission for PE.  On Eliquis 5 mg twice daily. - Continue Eliquis 5 mg twice daily  Diabetes: Recently diagnosed on admission in December 2020.  A1c:7.8.  It is believed that patient may have developed diabetes in the setting of frequent steroid courses.  She was not discharged on any diabetic medications.  Patient was started on prednisone 40 mg daily on admission which was d/c. CBG 181 - start CBG checks QAC/HS - determine need for SSI pending these results - can repeat A1c after 3/19  Paroxysmal A. Fib Home medications include digoxin 120 mg nightly, metoprolol 12.5 mg twice daily, flecainide 100 mg, Eliquis. -Continue home medications  History of hypertension On Lopressor 12.5 mg daily and diltiazem 120 mg daily.  BP this AM 114/59. - cont home meds  OSA/OHS Patient on BiPAP at home.  Majority of patient's respiratory problems believed to be secondary to OSA/OHS Per previous pulmonology consults.  Reports that home sleep study never came to the house. - BiPAP QHS - f/u pulm  outpatient  Morbid obesity Weight on admission 164.4 kg with a BMI of 60.31.  This is approximately the same weight as her previous admission. -Encouraged healthy lifestyle and weight management  MDD/anxiety Patient with history of anxiety.  Home medication includes Atarax 25 mg and Effexor 187.5 mg daily -Continue home medications  GERD Patient  takes Prilosec 20 mg and Pepcid 20 mg at home -Continue home meds  FEN/GI: Heart healthy PPx: Eliquis  Disposition: pending clinical improvement and PT/OT recs, likely home tomorrow  Subjective:  Patient reported chest tightness this morning and said that she does not feel ready to go home, her niece is at home today. Tolerated the BiPAP well overnight. Also reports tenderness in her legs bilaterally.   Saw patient again at 1:15pm regarding disposition: her preference is that she would like to go home over going to a SNF. She said that she still has chest tightness and that she feels her oxygen levels are low in her blood. I explained they have been in the normal ranges today on 3L oxygen. She also does not feel strong enough to go home today and by tomorrow she will be. She also does not like that she would not have staff to support her at home with her care. I explained that she is me  Objective: Temp:  [98.1 F (36.7 C)-98.6 F (37 C)] 98.6 F (37 C) (03/07 2306) Pulse Rate:  [84-103] 84 (03/07 2306) Resp:  [20-22] 22 (03/07 1706) BP: (100-123)/(59-69) 100/69 (03/07 2306) SpO2:  [93 %-95 %] 94 % (03/07 2310)  Physical Exam:  General: Alert, pleasant 64 year old female, cooperative,  no acute distress Cardio: Normal S1 and S2, RRR. Pulm: bibasal crackles, poor AE in bases of lungs, 3L oxygen [place] Abdomen: Bowel sounds normal. Abdomen soft and non-tender.  Extremities: Mild peripheral edema. Warm/ well perfused.  Strong radial pulse Neuro: Cranial nerves grossly intact  Laboratory: Recent Labs  Lab 08/31/19 0930 08/31/19 0930 08/31/19 1243 09/01/19 0038 09/02/19 0227  WBC 6.9  --   --  7.8 11.8*  HGB 9.7*   < > 11.2* 10.5* 9.7*  HCT 35.0*   < > 33.0* 37.7 35.5*  PLT 473*  --   --  545* 544*   < > = values in this interval not displayed.   Recent Labs  Lab 08/31/19 1854 09/01/19 0038 09/02/19 0227  NA 142 143 142  K 3.2* 3.9 3.6  CL 99 102 99  CO2 31 30 34*  BUN 7*  6* 11  CREATININE 0.63 0.59 0.71  CALCIUM 8.8* 8.7* 8.9  GLUCOSE 199* 196* 143*   BNP 40 Mag 1.6 COVID neg Urinalysis    Component Value Date/Time   COLORURINE YELLOW 08/31/2019 1338   APPEARANCEUR CLEAR 08/31/2019 1338   APPEARANCEUR Clear 10/02/2014 1645   LABSPEC 1.024 08/31/2019 1338   LABSPEC 1.018 10/02/2014 1645   PHURINE 7.0 08/31/2019 [place] 08/31/2019 1338   GLUCOSEU Negative 10/02/2014 1645   HGBUR NEGATIVE 08/31/2019 1338   BILIRUBINUR NEGATIVE 08/31/2019 1338   BILIRUBINUR Negative 10/02/2014 [place] 08/31/2019 1338   PROTEINUR NEGATIVE 08/31/2019 1338   UROBILINOGEN 1.0 01/31/2014 0745   NITRITE NEGATIVE 08/31/2019 1338   LEUKOCYTESUR NEGATIVE 08/31/2019 1338   LEUKOCYTESUR Negative 10/02/2014 1645    Imaging/Diagnostic Tests: No results found.  Lattie Haw, MD 09/02/2019, 5:39 AM PGY-2, [place] Intern pager: [phone], text pages welcome

## 2019-09-03 LAB — CBC
HCT: 35 % — ABNORMAL LOW (ref 36.0–46.0)
Hemoglobin: 9.7 g/dL — ABNORMAL LOW (ref 12.0–15.0)
MCH: 24.4 pg — ABNORMAL LOW (ref 26.0–34.0)
MCHC: 27.7 g/dL — ABNORMAL LOW (ref 30.0–36.0)
MCV: 87.9 fL (ref 80.0–100.0)
Platelets: 531 10*3/uL — ABNORMAL HIGH (ref 150–400)
RBC: 3.98 MIL/uL (ref 3.87–5.11)
RDW: 21.9 % — ABNORMAL HIGH (ref 11.5–15.5)
WBC: 10.1 10*3/uL (ref 4.0–10.5)
nRBC: 0 % (ref 0.0–0.2)

## 2019-09-03 LAB — BASIC METABOLIC PANEL
Anion gap: 9 (ref 5–15)
BUN: 14 mg/dL (ref 8–23)
CO2: 37 mmol/L — ABNORMAL HIGH (ref 22–32)
Calcium: 8.7 mg/dL — ABNORMAL LOW (ref 8.9–10.3)
Chloride: 96 mmol/L — ABNORMAL LOW (ref 98–111)
Creatinine, Ser: 0.64 mg/dL (ref 0.44–1.00)
GFR calc Af Amer: >60 mL/min (ref >60)
GFR calc non Af Amer: >60 mL/min (ref >60)
Glucose, Bld: 101 mg/dL — ABNORMAL HIGH (ref 70–99)
Potassium: 3.5 mmol/L (ref 3.5–5.1)
Sodium: 142 mmol/L (ref 135–145)

## 2019-09-03 LAB — GLUCOSE, CAPILLARY
Glucose-Capillary: 97 mg/dL (ref 70–99)
Glucose-Capillary: 93 mg/dL (ref 70–99)

## 2019-09-03 LAB — PHOSPHORUS: Phosphorus: 2.2 mg/dL — ABNORMAL LOW (ref 2.5–4.6)

## 2019-09-03 MED ORDER — K PHOS MONO-SOD PHOS DI & MONO 155-852-130 MG PO TABS
500.0000 mg | ORAL_TABLET | ORAL | Status: AC
  Administered 2019-09-03 (×2): 500 mg via ORAL
  Filled 2019-09-03 (×2): qty 2

## 2019-09-03 MED ORDER — CYCLOBENZAPRINE HCL 5 MG PO TABS: 5.0000 mg | ORAL_TABLET | Freq: Two times a day (BID) | ORAL | 0 refills | Status: DC | PRN

## 2019-09-03 NOTE — TOC Transition Note (Signed)
Transition of Care [facility]) - CM/SW Discharge Note   Patient Details  Name: [name] White MRN: [id number] Date of Birth: 10-31-1955  Transition of [facility]) CM/SW Contact:  Atilano Median, LCSW Phone Number: 09/03/2019, 2:50 PM   Clinical Narrative:     Discharged home with home health services. Patient aware and agreeable to this plan. CSW confirmed dc plan with patient's niece Jonelle Sidle. PTAR will transport home. No other needs at this time. Case closed to this CSW.   Final next level of care: [place] Barriers to Discharge: Barriers Resolved   Patient Goals and CMS Choice        Discharge Placement                Patient to be transferred to facility by: [place] Name of family member notified: Jonelle Sidle Patient and family notified of of transfer: 09/03/19  Discharge Plan and Services                          HH Arranged: PT, OT [place] Agency: [facility] [employer]) Date [place]: 09/03/19 Time [place]: 1450 Representative spoke with at [place]: [place] ([place]) Interventions     Readmission Risk Interventions Readmission Risk Prevention Plan 07/22/2019 03/08/2019  Transportation Screening Complete Complete  Medication Review Press photographer) Complete Complete  PCP or Specialist appointment within 3-5 days of discharge Complete Complete  HRI or [place] Complete Complete  SW Recovery Care/Counseling Consult Complete Complete  Palliative Care Screening Not Applicable Not [place] Not Applicable Not Applicable  Some recent data might be hidden

## 2019-09-03 NOTE — Progress Notes (Signed)
Patient placed on PPV overnight. 

## 2019-09-03 NOTE — Progress Notes (Signed)
Physical Therapy Treatment Patient Details Name: [name] White MRN: [id number] DOB: 1955/07/12 Today's Date: 09/03/2019    History of Present Illness 64 y.o. female presenting with desaturations . PMH is significant for MDD, morbid obesity, HTN, GERD, overactive bladder, osteoarthritis of both knees, a flutter, paroxysmal A. fib, chronic anticoagulation, OSA, history of PE,HFpEF, DM.    PT Comments    Pt reports she is going home around 4 o'clock today. Pt resting on 5L of supp O2 via [place]. Pt's resting SpO2 levels are at 94-96%. Pt requests to get washed up. Pt transfers supine to sit mod I with bed rail use and HOB elevated. Pt transfers stand pivot to commode with min guard for safety. She is able to take a step or two over to chair when practicing stand pivots x 2. Pt sits with back unsupported on commode for approximately 25 minutes to wash up and have BM. She is able to cross midline and maintain upright seated position throughout. Pt's gets tachycardiac (120-130 bpm) when transferring. Pt monitored throughout. Her baseline HR while resting was 100 bpm. RN notified. Pt educated to take frequent rest breaks at home when doing functional mobility. D/C plan remain appropriate. PT will continue to follow acutely.    Follow Up Recommendations  Home health PT;Supervision for mobility/OOB     Equipment Recommendations  None recommended by PT       Precautions / Restrictions Precautions Precautions: Fall Precaution Comments: watch o2 sats Restrictions Weight Bearing Restrictions: No    Mobility  Bed Mobility Overal bed mobility: Modified Independent Bed Mobility: Supine to Sit     Supine to sit: HOB elevated;Modified independent (Device/Increase time)     General bed mobility comments: Increased time, use of bed rails supine to sit  Transfers Overall transfer level: Needs assistance Equipment used: None Transfers: Stand Pivot Transfers   Stand pivot transfers: Min guard        General transfer comment: min guard for safety, pivot steps bed<BSC         Balance Overall balance assessment: Needs assistance Sitting-balance support: No upper extremity supported;Feet supported Sitting balance-Leahy Scale: Good     Standing balance support: No upper extremity supported Standing balance-Leahy Scale: Fair Standing balance comment: Able to stand pivot without UE support, min guard for safety however no LOB demonstrated when standing unsupported                            Cognition Arousal/Alertness: Awake/alert Behavior During Therapy: WFL for tasks assessed/performed Overall Cognitive Status: Within Functional Limits for tasks assessed                                        Exercises Other Exercises Other Exercises: pursed lip breathing    General Comments General comments (skin integrity, edema, etc.): Pt is on 5L of supp O2 via [place]. Pt requesting to wash up. SpO2 remains at 94-96% resting. SpO2 briefly drops to 91% after transferring off of commode however increases to 93-95% quickly when resting.       Pertinent Vitals/Pain Pain Assessment: No/denies pain           PT Goals (current goals can now be found in the care plan section) Acute Rehab PT Goals Patient Stated Goal: to go home PT Goal Formulation: With patient Time For Goal Achievement: 09/15/19 Potential to Achieve  Goals: Good Progress towards PT goals: Progressing toward goals    Frequency    Min 3X/week      PT Plan Current plan remains appropriate       AM-PAC PT "6 Clicks" Mobility   Outcome Measure  Help needed turning from your back to your side while in a flat bed without using bedrails?: A Little Help needed moving from lying on your back to sitting on the side of a flat bed without using bedrails?: A Little Help needed moving to and from a bed to a chair (including a wheelchair)?: A Little Help needed standing up from a chair using  your arms (e.g., wheelchair or bedside chair)?: A Little Help needed to walk in hospital room?: A Lot Help needed climbing 3-5 steps with a railing? : Total 6 Click Score: 15    End of Session Equipment Utilized During Treatment: Oxygen Activity Tolerance: Patient tolerated treatment well Patient left: in chair;with call bell/phone within reach Nurse Communication: Mobility status PT Visit Diagnosis: Muscle weakness (generalized) (M62.81);Other abnormalities of gait and mobility (R26.89)     Time: [id number] PT Time Calculation (min) (ACUTE ONLY): 42 min  Charges:  $Therapeutic Activity: 38-52 mins                    Jodelle Green, PT, DPT Acute Rehabilitation Services Office [phone]  Jodelle Green 09/03/2019, 4:23 PM

## 2019-09-03 NOTE — Progress Notes (Signed)
Pt would take off CPAP to eat and drink throughout the night. Pt stated that she new how to operate mask. Will monitor.

## 2019-09-03 NOTE — Discharge Instructions (Addendum)
Ms [name] White, [place] were admitted with shortness of breath because your oxygen levels were low in the blood.  In hospital we treated you with nebulizer and gave your bipap machine at night. Your symptoms improved. We think you have improved and are ready to go home. We have arranged a follow up appointment for you on Friday 12th March with Dr Kris Mouton at 1:30pm. Please arrive 15 mins early.  In the meantime if you experience further shortness of breath, fevers, cough, vomiting then please go to the ER immediately.   Best wishes,  Family Medicine team

## 2019-09-03 NOTE — Consult Note (Signed)
   [facility] CM Inpatient Consult   09/03/2019  AILEY KNEPPER 1955-09-20 [id number]   Patient with extreme high risk for unplanned readmission with less than 30 days readmission and 8 hospitalizations in 6 months noted.   Patient will have the transition of care call conducted by the primary care provider. Patient is in the Chronic Care Management program at [place] team.   12:00 pm Spoke with the patient via hospital phone, HIPAA verified.  Patient states she is having difficulty getting home health because of needs to communicate with her niece Jonelle Sidle, who wants to be in the apartment.  Patient endorses that she once in the room and inability to ambulate safely she is unable to get out of her bedroom with  a wheelchair because the door is too small. Chart reviewed of PT/OT recommendations. Patient endorses assistance needed for food and is hopeful she can get personal care services since her needs have changed since she last applied and since she has a residence.  Plan: Notification sent to the [facility] Management and made aware of above needs. Spoke with Tressia Miners, Embedded RN CM regarding admission and potential post hospital follow up needs. Currently, patient is for transitioning today.  Reached out to inpatient Transition of Care team member for disposition clarification and needs for home health barriers to care and that the niece is the contact person.  Please contact for further questions,  Natividad Brood, RN BSN [facility] Liaison  [phone] business mobile phone Toll free office [phone]  Fax number: [phone] [email or web address]

## 2019-09-05 ENCOUNTER — Ambulatory Visit: Payer: Medicare HMO

## 2019-09-05 ENCOUNTER — Other Ambulatory Visit: Payer: Self-pay

## 2019-09-05 NOTE — Chronic Care Management (AMB) (Signed)
  Care Management   Outreach Note  09/05/2019 Name: [name] White MRN: [id number] DOB: [birth year]  Referred by: [name], MD Reason for referral : Care Coordination (Care Management [facility] F/U)   An unsuccessful telephone outreach was attempted today. The patient was referred to the case management team for assistance with care management and care coordination.   Follow Up Plan: A HIPPA compliant phone message was left for the patient providing contact information and requesting a return call.  The care management team will reach out to the patient again over the next 3 days.   Left a message on the voicemail of [name] and her niece Eduard Clos RN, BSN, [facility] Care Management Coordinator [place] Phone: [phone] Fax: [phone]

## 2019-09-05 NOTE — Progress Notes (Signed)
This encounter was created in error - please disregard.

## 2019-09-05 NOTE — Addendum Note (Signed)
Addended by: Waldon Reining on: 09/05/2019 02:57 PM   Modules accepted: Level of Service, SmartSet

## 2019-09-06 ENCOUNTER — Telehealth: Payer: Self-pay

## 2019-09-06 ENCOUNTER — Telehealth (INDEPENDENT_AMBULATORY_CARE_PROVIDER_SITE_OTHER): Payer: Medicare HMO | Admitting: Family Medicine

## 2019-09-06 ENCOUNTER — Ambulatory Visit: Payer: Self-pay | Admitting: Licensed Clinical Social Worker

## 2019-09-06 ENCOUNTER — Other Ambulatory Visit: Payer: Self-pay

## 2019-09-06 DIAGNOSIS — R0602 Shortness of breath: Secondary | ICD-10-CM | POA: Diagnosis not present

## 2019-09-06 DIAGNOSIS — R5381 Other malaise: Secondary | ICD-10-CM

## 2019-09-06 NOTE — Telephone Encounter (Signed)
Melissa from [facility] calls nurse line to report that patient will not be starting PT/OT services until the beginning of next week due to staffing.   FYI to PCP  Talbot Grumbling, RN

## 2019-09-06 NOTE — Telephone Encounter (Signed)
ATC patient unable to reach , will send mychart message for patient to call office to make appointment.

## 2019-09-06 NOTE — Chronic Care Management (AMB) (Signed)
    Clinical Social Work  Care Management Outreach   09/06/2019 Name: [name] White MRN: [id number] DOB: 1955/09/09  [name] White is a 64 y.o. year old female who is a primary care patient of [name], MD .  LCSW was consulted by in-patient [facility] team to assistance with setting up [place] and assess for other needs ( food).   LCSW reached out to [name] today by phone to introduce self, assess needs and offer Care Management services.   Patient did not answer there phone. A HIPPA compliant phone message was left for the patient providing contact information and requesting a return call.   LCSW also called patient's niece who was at work. Informed LCSW to call  Patient after 12:00.   Review of patient status, including review of consultants reports, relevant laboratory and other test results, and collaboration with appropriate care team members and the patient's provider was performed as part of comprehensive patient evaluation and provision of care management services.    Follow Up Plan: CCM team will call patient in 1 to 3 days.  [name], LCSW Clinical Social Worker [place] / [place]   [phone] 9:26 AM

## 2019-09-07 DIAGNOSIS — J9611 Chronic respiratory failure with hypoxia: Secondary | ICD-10-CM | POA: Diagnosis not present

## 2019-09-09 NOTE — Assessment & Plan Note (Signed)
Improved from the standpoint.  Currently satting well on 5 L with no respiratory distress.  Continue as needed albuterol, Dulera, Incruse.

## 2019-09-09 NOTE — Assessment & Plan Note (Signed)
Continues to progress from the standpoint.  Able to do all of her ADLs and be more active.

## 2019-09-09 NOTE — Progress Notes (Signed)
[place] Telemedicine Visit  Patient consented to have virtual visit. Method of visit: Telephone  Encounter participants: Patient: [name] White - located at home Provider: [name] - located at [facility]  Chief Complaint: Hospital follow-up  HPI: Patient following up from hospitalization on 08/31/2019.  She is admitted for 3 days increase shortness of breath.  She was actually satting well at 96% on 2 L which was well below her discharge oxygen level.  She did desat 84% on 5 L with ambulation and was admitted for this reason.  She received IV Lasix and steroids for CHF estimation and COPD.  These were discontinued after 1 day and she was discharged on her home dose of Lasix 20 mg.  At time of discharge patient was satting well on 5 L.  She has been doing well since discharge.  Is able to perform all her ADLs and ambulate well.  She is currently on 5 L and has not been short of breath.  She is waiting for PT/OT to come out to her house.  Of note due to staffing issues I did receive a message stating that would start on 3/15.  ROS: per HPI  Pertinent PMHx:   Exam:  General: Pleasant, no distress Respiratory: No wheezing, no respiratory distress, able speak in clear coherent sentences  Assessment/Plan:  Shortness of breath Improved from the standpoint.  Currently satting well on 5 L with no respiratory distress.  Continue as needed albuterol, Dulera, Incruse.  [place] Continues to progress from the standpoint.  Able to do all of her ADLs and be more active.    Time spent during visit with patient: 12 minutes

## 2019-09-10 ENCOUNTER — Ambulatory Visit: Payer: Medicare HMO

## 2019-09-11 ENCOUNTER — Ambulatory Visit: Payer: Medicare HMO

## 2019-09-11 NOTE — Chronic Care Management (AMB) (Signed)
Care Management   Follow Up Note   09/11/2019 Name: [name] White MRN: [id number] DOB: [birth year]  Referred by: [name], MD Reason for referral : Care Coordination ([facility] F/U)   [name] White is a 64 y.o. year old female who is a primary care patient of [name], MD. The care management team was consulted for assistance with care management and care coordination needs.    Review of patient status, including review of consultants reports, relevant laboratory and other test results, and collaboration with appropriate care team members and the patient's provider was performed as part of comprehensive patient evaluation and provision of chronic care management services.    SDOH (Social Determinants of Health) assessments performed: No See Care Plan activities for detailed interventions related to [facility])     Advanced Directives: See Care Plan and Vynca application for related entries.   Goals Addressed            This Visit's Progress   . " I have not seen Home health " (pt-stated)       CARE PLAN ENTRY (see longtitudinal plan of care for additional care plan information)  Current Barriers:  Marland Kitchen Knowledge Deficits related to post discharge instructions following IP event 08/09/19 to 08/12/19 . Chronic Disease Management support and education needs related to post discharge instructions, medication review, MD follow up and PT  . Nurse Case Manager Clinical Goal(s):  Marland Kitchen Over the next 14 days, patient will verbalize understanding of plan for post discharge instructions and HH PT  Interventions:  . Evaluation of current treatment plan related to post discharge care and patient's adherence to plan as established by provider. . Advised patient to keep phone charged and be available for when the doctor calls for appointment since she does not want to schedule appointment with no other provider  . Reviewed medications with patient and discussed  importance of taking medication.   Patient and [name] White states that she has all of her meds. Using oxygen.  [name] White stated that she is using her oxygen.  Patient states that it is hard for her to talk on the phone and she prefers that her niece talk for her.  She knows to call the office for any questions or concerns Call 911 for severe shortness of breath  fever chills feeling confused chest pain  . Collaborated with Mort Sawyers at [facility] for [facility] regarding appt.  For physical therapy. She stated that they had been in touch with [name] White and they declined start of care for 2/19 and 2/20 and would like for start of care to begin on 2/20. . Collaborate with [facility] liaison to get other numbers to reach the patient for follow up.  Patient [id number] [name] White [phone] . Reviewed scheduled/upcoming provider appointments including: Patient had an appointment on 2/17 but states that her phone was not charged.  Patient has an appointment with Dr Kris Mouton on 08/27/19 at 3:50 offered the patient visit sooner with other physicians but she declined and stated she only wanted to see Dr. Kris Mouton.  . 08/29/19 . Spoke with the patient and she sounds very good no broken sentences . She is still on 7 liters  not easy to talk at times . No chest pain, had some swelling but it has gone sown . She states that she is monitoring her fluid intake only 1500 ml /day . Patient states that she has a form that she needs signed for her incontinences supplies  Patient Self Care Activities:  . Calls provider office for new concerns or questions . Unable to perform ADLs independently . Unable to perform IADLs independently . Does not maintain contact with provider office  Initial goal documentation         The patient has been provided with contact information for the care management team and has been advised to call with any health related questions or concerns.   [name] Arms RN, BSN, [facility] Care  Management Coordinator [place] Phone: [phone] Fax: [phone]

## 2019-09-11 NOTE — Patient Instructions (Signed)
Visit Information  Goals Addressed            This Visit's Progress   . " I have not seen Home health " (pt-stated)       CARE PLAN ENTRY (see longtitudinal plan of care for additional care plan information)  Current Barriers:  Marland Kitchen Knowledge Deficits related to post discharge instructions following IP event 08/09/19 to 08/12/19 . Chronic Disease Management support and education needs related to post discharge instructions, medication review, MD follow up and PT  . Nurse Case Manager Clinical Goal(s):  Marland Kitchen Over the next 14 days, patient will verbalize understanding of plan for post discharge instructions and HH PT  Interventions:  . Evaluation of current treatment plan related to post discharge care and patient's adherence to plan as established by provider. . Advised patient to keep phone charged and be available for when the doctor calls for appointment since she does not want to schedule appointment with no other provider  . Reviewed medications with patient and discussed importance of taking medication.   Patient and [name] states that she has all of her meds. Using oxygen.  [name] stated that she is using her oxygen.  Patient states that it is hard for her to talk on the phone and she prefers that her niece talk for her.  She knows to call the office for any questions or concerns Call 911 for severe shortness of breath  fever chills feeling confused chest pain  . Collaborated with Mort Sawyers at [facility] for Advance regarding appt.  For physical therapy. She stated that they had been in touch with [name] and they declined start of care for 2/19 and 2/20 and would like for start of care to begin on 2/20. . Collaborate with [facility] liaison to get other numbers to reach the patient for follow up.  Patient [phone] [name] [phone] . Reviewed scheduled/upcoming provider appointments including: Patient had an appointment on 2/17 but states that her phone was not charged.   Patient has an appointment with Dr Kris Mouton on 08/27/19 at 3:50 offered the patient visit sooner with other physicians but she declined and stated she only wanted to see Dr. Kris Mouton.  . 08/29/19 . Spoke with the patient and she sounds very good no broken sentences . She is still on 7 liters  not easy to talk at times . No chest pain, had some swelling but it has gone sown . She states that she is monitoring her fluid intake only 1500 ml /day . Patient states that she has a form that she needs signed for her incontinences supplies  Patient Self Care Activities:  . Calls provider office for new concerns or questions . Unable to perform ADLs independently . Unable to perform IADLs independently . Does not maintain contact with provider office  Initial goal documentation        Ms. Pyeatt was given information about Care Management services today including:  1. Care Management services include personalized support from designated clinical staff supervised by her physician, including individualized plan of care and coordination with other care providers 2. 24/7 contact phone numbers for assistance for urgent and routine care needs. 3. The patient may stop CCM services at any time (effective at the end of the month) by phone call to the office staff.  Patient agreed to services and verbal consent obtained.   The patient verbalized understanding of instructions provided today and declined a print copy of patient instruction materials.   The  patient has been provided with contact information for the care management team and has been advised to call with any health related questions or concerns.   Lazaro Arms RN, BSN, [facility] Care Management Coordinator [place] Phone: [phone] Fax: [phone]

## 2019-09-12 ENCOUNTER — Ambulatory Visit: Payer: Self-pay | Admitting: Licensed Clinical Social Worker

## 2019-09-12 NOTE — Chronic Care Management (AMB) (Signed)
  Social Work Care Management  Unsuccessful Phone Outreach   09/12/2019 Name: [name] White MRN: [id number] DOB: 1955/07/19  Referred by: [name], MD,  Reason for referral : Care Coordination (for support)   [name] White is a 64 y.o. year old female who sees [name], MD for primary care.  LCSW received referral to assess patient's food needs and assist with setting up personal care services. Also collaboration with CCM RN on patient's needs  LCSW called patient to assess needs and barriers. Telephone outreach was unsuccessful. A HIPPA compliant phone message was left for the patient providing contact information and requesting a return call.  Plan:  If no return call is received. LCSW will call again in 3 to 5 days.  [name], LCSW Clinical Social Worker [place] / [place]   [phone] 3:25 PM

## 2019-09-13 NOTE — Chronic Care Management (AMB) (Signed)
  Care Management   Follow Up Note   09/13/2019 Name: [name] White MRN: [id number] DOB: Mar 06, 1956  Referred by: [name], MD Reason for referral : Care Coordination (Care Management [facility] F/U)   [name] White is a 64 y.o. year old female who is a primary care patient of [name], MD. The care management team was consulted for assistance with care management and care coordination needs.    Review of patient status, including review of consultants reports, relevant laboratory and other test results, and collaboration with appropriate care team members and the patient's provider was performed as part of comprehensive patient evaluation and provision of chronic care management services.    SDOH (Social Determinants of Health) assessments performed: No See Care Plan activities for detailed interventions related to [facility])     Advanced Directives: See Care Plan and Vynca application for related entries.   Goals Addressed            This Visit's Progress   . "I am feeling some better after getting out of Hospital" (pt-stated)       CARE PLAN ENTRY (see longtitudinal plan of care for additional care plan information)  Current Barriers:  . Chronic Disease Management support and education needs related to post discharge instructions, including medication review , MD follow up appointments and Home Health  Nurse Case Manager Clinical Goal(s):  Marland Kitchen Over the next 14 days, patient will verbalize understanding of plan for home health PT/OT services  Interventions:  . Evaluation of current treatment plan and patient's adherence to plan as established by provider. . Reviewed medications with patient  . Discussed plans with patient for ongoing care management follow up and provided patient with direct contact information for care management team . Reviewed scheduled/upcoming provider appointments 09/10/19 . Spoke with [name] she was just getting off work to let her  know that [name] LCSW has sent out the paper work for [employer] worker for the patient  . Also that Home health was short staffed and that they should be contacting them this week for the start of care.  Patient Self Care Activities:  . Patient verbalizes understanding of plan  . Self administers medications as prescribed . Calls pharmacy for medication refills . Calls provider office for new concerns or questions  Initial goal documentation         The care management team will reach out to the patient again over the next 14 days.  The patient has been provided with contact information for the care management team and has been advised to call with any health related questions or concerns.   Lazaro Arms RN, BSN, [facility] Care Management Coordinator [place] Phone: [phone] Fax: [phone]

## 2019-09-13 NOTE — Patient Instructions (Signed)
Visit Information  Goals Addressed            This Visit's Progress   . "I am feeling some better after getting out of Hospital" (pt-stated)       CARE PLAN ENTRY (see longtitudinal plan of care for additional care plan information)  Current Barriers:  . Chronic Disease Management support and education needs related to post discharge instructions, including medication review , MD follow up appointments and Home Health  Nurse Case Manager Clinical Goal(s):  Marland Kitchen Over the next 14 days, patient will verbalize understanding of plan for home health PT/OT services  Interventions:  . Evaluation of current treatment plan and patient's adherence to plan as established by provider. . Reviewed medications with patient  . Discussed plans with patient for ongoing care management follow up and provided patient with direct contact information for care management team . Reviewed scheduled/upcoming provider appointments 09/10/19 . Spoke with [name] she was just getting off work to let her know that [name] LCSW has sent out the paper work for [employer] worker for the patient  . Also that Home health was short staffed and that they should be contacting them this week for the start of care.  Patient Self Care Activities:  . Patient verbalizes understanding of plan  . Self administers medications as prescribed . Calls pharmacy for medication refills . Calls provider office for new concerns or questions  Initial goal documentation        [name] White was given information about Care Management services today including:  1. Care Management services include personalized support from designated clinical staff supervised by her physician, including individualized plan of care and coordination with other care providers 2. 24/7 contact phone numbers for assistance for urgent and routine care needs. 3. The patient may stop CCM services at any time (effective at the end of the month) by phone call to the  office staff.  Patient agreed to services and verbal consent obtained.   The patient verbalized understanding of instructions provided today and declined a print copy of patient instruction materials.   The care management team will reach out to the patient again over the next 14 days.  The patient has been provided with contact information for the care management team and has been advised to call with any health related questions or concerns.   Lazaro Arms RN, BSN, [facility] Care Management Coordinator [place] Phone: [phone] Fax: [phone]

## 2019-09-17 ENCOUNTER — Ambulatory Visit: Payer: Self-pay | Admitting: Licensed Clinical Social Worker

## 2019-09-17 NOTE — Chronic Care Management (AMB) (Signed)
    Clinical Social Work  Care Management Outreach   09/17/2019 Name: [name] White MRN: [id number] DOB: 1956/06/03  [name] White is a 64 y.o. year old female who is a primary care patient of [name], MD .  LCSW received phone call from [facility] with [facility].  She spoke with patient's niece yesterday to schedule PCS assessment but niece asked her to call back.  She has not been successfully in scheduling the assessment.    Plan:  1. LCSW will share information with patient if she returns call.  2. Will continue to coordinate care with CCM RN  [name], [place] / [name]   [phone] 2:40 PM

## 2019-09-17 NOTE — Chronic Care Management (AMB) (Signed)
   Social Work Care Management  2nd Unsuccessful  Phone Outreach   09/17/2019 Name: [name] White MRN: [id number] DOB: 05-Jul-1955  Referred by: [name] Dawn, MD ,  Reason for referral : Care Coordination (PCS update)   [name] White is a 64 y.o. year old female who sees [name] Dawn, MD for primary care.    LCSW called [name] White to F/U on [name] White referral. They have received the referral and unable to speak to patient to schedule the assessment.  Left messages for patient and niece.  LCSW called patient, 2nd unsuccessful telephone outreach attempt to [name] White.  A HIPPA compliant phone message was left for the patient providing contact information and requesting a return call.  Plan: LCSW will wait for return call, if no return call is received, LCSW will continue to collaborate with CCM RN care manager for patient's needs.  [name] Lanius, LCSW Clinical Social Worker [place] / [place]   [phone] 1:48 PM

## 2019-09-18 ENCOUNTER — Telehealth: Payer: Self-pay

## 2019-09-18 NOTE — Telephone Encounter (Signed)
Lenna Sciara, RN clinical Coordinator with [facility], calls nurse line regarding services for patient. At this time agency will be non-admitting patient as a result of multiple attempts to initiate services with no response from patient.   FYI to PCP  Talbot Grumbling, RN

## 2019-09-20 ENCOUNTER — Ambulatory Visit: Payer: Self-pay | Admitting: Licensed Clinical Social Worker

## 2019-09-20 DIAGNOSIS — Z7189 Other specified counseling: Secondary | ICD-10-CM

## 2019-09-20 DIAGNOSIS — Z741 Need for assistance with personal care: Secondary | ICD-10-CM

## 2019-09-20 NOTE — Chronic Care Management (AMB) (Signed)
Care Management   Clinical Social Work initial Note  09/20/2019 Name: [name] White MRN: [id number] DOB: 05-18-1956 The Care Management team was consulted to assist the patient with Level of Care Concerns for [name] White and assistance with food.  [name] White is a 64 y.o. year old female who sees [name] Dawn, MD for primary care. LCSW reached out to [name] White today by phone to introduce self, assess needs and barriers to care.  Unable to reach patient but was able to speak with her niece who is her primary care taker.  Assessment: Patient has not schedules assessment for PCS services explained the importance of setting up assessment to avoid referral being closed and would have to start the process over. Also discussed Mom's meals and provided phone number for niece to call.   Review of patient status, including review of consultants reports, relevant laboratory and other test results, and collaboration with appropriate care team members and the patient's provider was performed as part of comprehensive patient evaluation and provision of chronic care management services.    SDOH (Social Determinants of Health) assessments performed: No:   Goals Addressed            This Visit's Progress   . Personal Care Services   Not on track    [place] (see longitudinal plan of care for additional care plan information) Current Barriers & Progress:   . Patient needs Support, Education, and Care Coordination to resolve unmet Personal Care needs . PCS referral has been faxed to [name] White . [name] White unable to reach patient left several messages and have reached out to [place] Work Goal(s):  Marland Kitchen Over the next 30 to 45 days, patient will have personal care needs met as evident by having PCS Aide in the home assisting with needs. . Over the next 30 days patient will work with LCSW, and [name] White to coordinate care for [name] White and  select a personal care service provider  Return calls from [name] White to set up initial PCS assessment once application is approved Call [name] White with questions [phone] or [phone]  Interventions provided by LCSW : . Assessed for barriers and provided education on Personal Care Service process,  . Collaborate with primary care provider ref completing PCS referral ( Referral placed in mailbox) . PCS referral faxed to [name] White at [phone]  Patient Self Care Activities & Deficits:  . Patient is unable to perform ADLs independently without assistance  . Family/support system will assist patient with meeting needs until [name] White is approved Initial goal documentation                    Outpatient Encounter Medications as of 09/20/2019  Medication Sig Note  . acetaminophen (TYLENOL) 325 MG tablet Take 2 tablets (650 mg total) by mouth every 6 (six) hours as needed for mild pain.   Marland Kitchen albuterol (PROVENTIL) (2.5 MG/3ML) 0.083% nebulizer solution Take 3 mLs (2.5 mg total) by nebulization every 6 (six) hours as needed for wheezing or shortness of breath.   Marland Kitchen apixaban (ELIQUIS) 5 MG TABS tablet Take 1 tablet (5 mg total) by mouth 2 (two) times daily.   Marland Kitchen atorvastatin (LIPITOR) 40 MG tablet Take 1 tablet (40 mg total) by mouth daily at 6 PM.   . azelastine (ASTELIN) 0.1 % nasal spray Place 2 sprays into both nostrils 2 (two) times daily. Use in each nostril as  directed (Patient taking differently: Place 2 sprays into both nostrils daily. ) 06/15/2019: Pt uses both Astelin and flonase every morning  . azelastine (OPTIVAR) 0.05 % ophthalmic solution Place 1 drop into both eyes 2 (two) times daily as needed (itchy eyes).   . cyclobenzaprine (FLEXERIL) 5 MG tablet Take 1 tablet (5 mg total) by mouth 2 (two) times daily as needed for muscle spasms.   . cycloSPORINE (RESTASIS) 0.05 % ophthalmic emulsion Place 1 drop into both eyes 2 (two) times daily.     . diclofenac sodium (VOLTAREN) 1 % GEL APPLY 2 GRAMS EXTERNALLY TO THE AFFECTED AREA FOUR TIMES DAILY (Patient taking differently: Apply 2 g topically 4 (four) times daily as needed (arthritis pain - knees, shoulder and thighs). )   . diltiazem (CARDIZEM CD) 120 MG 24 hr capsule Take 1 capsule (120 mg total) by mouth daily. (Patient taking differently: Take 120 mg by mouth at bedtime. )   . famotidine (PEPCID) 20 MG tablet Take 1 tablet (20 mg total) by mouth daily.   . ferrous sulfate 325 (65 FE) MG tablet Take 1 tablet (325 mg total) by mouth daily with breakfast.   . flecainide (TAMBOCOR) 100 MG tablet TAKE 1 TABLET BY MOUTH 2 TIMES A DAY. PLEASE KEEP UPCOMING APPOINTMENT IN JANUARY BEFORE ANYMORE REFILLS. (Patient taking differently: TAKE 1 TABLET BY MOUTH 2 TIMES A DAY. PLEASE KEEP UPCOMING APPOINTMENT IN JANUARY BEFORE ANYMORE REFILLS.)   . fluticasone (FLONASE) 50 MCG/ACT nasal spray Place 2 sprays into both nostrils daily.  06/15/2019: Pt uses both Astelin and Flonase every morning  . furosemide (LASIX) 40 MG tablet Take 40 mg by mouth daily.   Marland Kitchen gabapentin (NEURONTIN) 100 MG capsule Take 2 capsules (200 mg total) by mouth 3 (three) times daily.   Marland Kitchen guaiFENesin-dextromethorphan (ROBITUSSIN DM) 100-10 MG/5ML syrup Take 5 mLs by mouth every 4 (four) hours as needed for cough.   . hydrOXYzine (VISTARIL) 25 MG capsule Take 1 capsule (25 mg total) by mouth 2 (two) times daily as needed for anxiety.   Marland Kitchen ipratropium-albuterol (DUONEB) 0.5-2.5 (3) MG/3ML SOLN Take 3 mLs by nebulization every 12 (twelve) hours as needed (shortness of breath, wheezing).   Marland Kitchen loratadine (CLARITIN) 10 MG tablet TAKE 1 TABLET(10 MG) BY MOUTH DAILY (Patient taking differently: TAKE 1 TABLET(10 MG) BY MOUTH DAILY)   . Melatonin 3 MG TABS Take 1 tablet (3 mg total) by mouth at bedtime as needed (sleep). (Patient taking differently: Take 3 mg by mouth at bedtime. )   . metoprolol tartrate (LOPRESSOR) 25 MG tablet Take 0.5  tablets (12.5 mg total) by mouth 2 (two) times daily.   . mometasone-formoterol (DULERA) 100-5 MCG/ACT AERO Inhale 2 puffs into the lungs 2 (two) times daily.   . montelukast (SINGULAIR) 10 MG tablet Take 1 tablet (10 mg total) by mouth at bedtime.   Marland Kitchen omeprazole (PRILOSEC) 20 MG capsule Take 1 capsule (20 mg total) by mouth daily.   . OXYGEN Inhale 7 L into the lungs continuous.    . polyethylene glycol (MIRALAX / GLYCOLAX) 17 g packet Take 17 g by mouth daily as needed for mild constipation. 08/09/2019: Pt is drinking prune juice instead of taking miralax  . topiramate (TOPAMAX) 50 MG tablet Take 1 tablet (50 mg total) by mouth daily.   Marland Kitchen umeclidinium bromide (INCRUSE ELLIPTA) 62.5 MCG/INH AEPB Inhale 1 puff into the lungs daily.   Marland Kitchen venlafaxine (EFFEXOR) 37.5 MG tablet Take 37.5 mg by mouth daily with breakfast. Take  with a 150 mg ER capsule every morning   . venlafaxine XR (EFFEXOR-XR) 150 MG 24 hr capsule Take 1 capsule (150 mg total) by mouth daily with breakfast. Take 142m capsule with 37.590mtablet (Patient taking differently: Take 150 mg by mouth daily with breakfast. Take with a 37.5 mg tablet every morning)   . VENTOLIN HFA 108 (90 Base) MCG/ACT inhaler INHALE 2 PUFFS INTO THE LUNGS EVERY 6 HOURS AS NEEDED FOR WHEEZING OR SHORTNESS OF BREATH (Patient taking differently: Inhale 2 puffs into the lungs every 6 (six) hours as needed for wheezing or shortness of breath. )    No facility-administered encounter medications on file as of 09/20/2019.       Follow Up Plan:  1. Niece will call [name] schedule PCS assessment      2. Niece will call Mom's meals [phone] to April 6th as patient will no longer qualify as it will be more than 30 days from her White discharge date 3. CCM RN will F/U with patient in 3 days   De[place]imer Lanius[place]orn[place] [phone]:45 PM

## 2019-09-20 NOTE — Patient Instructions (Signed)
Licensed Clinical Social Worker Visit Information [name] White  it was nice speaking with your niece. Please call me directly if you have questions [phone] Goals we discussed today:  Goals Addressed            This Visit's Progress   . Personal Care Services   Not on track    [place] (see longitudinal plan of care for additional care plan information) Current Barriers & Progress:   . Patient needs Support, Education, and Care Coordination to resolve unmet Personal Care needs . PCS referral has been faxed to [facility] . [place] unable to reach patient left several messages and have reached out to [place] Work Goal(s):  Marland Kitchen Over the next 30 to 45 days, patient will have personal care needs met as evident by having PCS Aide in the home assisting with needs. . Over the next 30 days patient will work with LCSW, and [facility] to coordinate care for [facility] and select a personal care service provider  Return calls from [facility] to set up initial PCS assessment once application is approved Call [facility] with questions [phone] or [phone]  Interventions provided by LCSW : . Assessed for barriers and provided education on Personal Care Service process,  . Collaborate with primary care provider ref completing PCS referral ( Referral placed in mailbox) . PCS referral faxed to [employer] at [phone]  Patient Self Care Activities & Deficits:  . Patient is unable to perform ADLs independently without assistance  . Family/support system will assist patient with meeting needs until PCS is approved Initial goal documentation     Materials provided: Verbal education about PCS and mom's meals as well as phone numbers Ms. Uriegas received Care Management services today:  1. Care Management services include personalized support from designated clinical staff supervised by her physician, including  individualized plan of care and coordination with other care providers 2. 24/7 contact [phone] for assistance for urgent and routine care needs. 3. Care Management are voluntary services and be declined at any time by calling the office.  patient's neice verbally agreed to assistance and services provided by embedded care coordination/care management team today.   Patient's niece verbalizes understanding of instructions provided today.  Follow up plan: CCM team will F/U in 3 days   [name] Cane, LCSW

## 2019-09-23 DIAGNOSIS — J441 Chronic obstructive pulmonary disease with (acute) exacerbation: Secondary | ICD-10-CM | POA: Diagnosis not present

## 2019-09-23 DIAGNOSIS — N3281 Overactive bladder: Secondary | ICD-10-CM | POA: Diagnosis not present

## 2019-09-24 ENCOUNTER — Telehealth: Payer: Self-pay

## 2019-09-24 ENCOUNTER — Other Ambulatory Visit: Payer: Self-pay | Admitting: Family Medicine

## 2019-09-24 NOTE — Patient Instructions (Signed)
Visit Information  Goals Addressed            This Visit's Progress   . "I am feeling some better after getting out of Hospital" (pt-stated)       CARE PLAN ENTRY (see longtitudinal plan of care for additional care plan information)  Current Barriers:  . Chronic Disease Management support and education needs related to post discharge instructions, including medication review , MD follow up appointments and Home Health  Nurse Case Manager Clinical Goal(s):  Marland Kitchen Over the next 14 days, patient will verbalize understanding of plan for home health PT/OT services  Interventions:  . Evaluation of current treatment plan and patient's adherence to plan as established by provider. . Reviewed medications with patient  . Discussed plans with patient for ongoing care management follow up and provided patient with direct contact information for care management team . Reviewed scheduled/upcoming provider appointments 09/10/19 . Spoke with [name] she was just getting off work to let her know that [name] LCSW has sent out the paper work for [employer] worker for the patient  . Also that Home health was short staffed and that they should be contacting them this week for the start of care. . 09/11/19 . Patient states that her breathing has improved  . She is using 5 liters 6 if the weather changes . She a walking just from her bed to the bed side commode  . She states that she monitors her fluid intake . She denies any chest pain, swelling or shortness of breath . Her goal is to be able to walk from the bed to the bathroom  Patient Self Care Activities:  . Patient verbalizes understanding of plan  . Self administers medications as prescribed . Calls pharmacy for medication refills . Calls provider office for new concerns or questions  Please see past updates related to this goal by clicking on the "Past Updates" button in the selected goal         Ms. Delaplane was given information about Care  Management services today including:  1. Care Management services include personalized support from designated clinical staff supervised by her physician, including individualized plan of care and coordination with other care providers 2. 24/7 contact phone numbers for assistance for urgent and routine care needs. 3. The patient may stop CCM services at any time (effective at the end of the month) by phone call to the office staff.  Patient agreed to services and verbal consent obtained.   The patient verbalized understanding of instructions provided today and declined a print copy of patient instruction materials.   The care management team will reach out to the patient again over the next 14 days.  The patient has been provided with contact information for the care management team and has been advised to call with any health related questions or concerns.  Lazaro Arms RN, BSN, [facility] Care Management Coordinator [place] Phone: [phone] Fax: [phone]   3

## 2019-09-24 NOTE — Telephone Encounter (Signed)
[name] White, home health PT, calls nurse line requesting verbal orders for start of care for September 25, 2019. VO given to Gracee.

## 2019-09-24 NOTE — Chronic Care Management (AMB) (Signed)
  Care Management   Follow Up Note   09/24/2019 Name: [name] White MRN: [id number] DOB: 1955-11-30  Referred by: [name] Dawn, MD Reason for referral : Care Coordination ([facility] F/U)   [name] White is a 64 y.o. year old female who is a primary care patient of [name] Dawn, MD. The care management team was consulted for assistance with care management and care coordination needs.    Review of patient status, including review of consultants reports, relevant laboratory and other test results, and collaboration with appropriate care team members and the patient's provider was performed as part of comprehensive patient evaluation and provision of chronic care management services.    SDOH (Social Determinants of Health) assessments performed: No See Care Plan activities for detailed interventions related to [facility])     Advanced Directives: See Care Plan and Vynca application for related entries.   Goals Addressed            This Visit's Progress   . "I am feeling some better after getting out of Hospital" (pt-stated)       CARE PLAN ENTRY (see longtitudinal plan of care for additional care plan information)  Current Barriers:  . Chronic Disease Management support and education needs related to post discharge instructions, including medication review , MD follow up appointments and Home Health  Nurse Case Manager Clinical Goal(s):  Marland Kitchen Over the next 14 days, patient will verbalize understanding of plan for home health PT/OT services  Interventions:  . Evaluation of current treatment plan and patient's adherence to plan as established by provider. . Reviewed medications with patient  . Discussed plans with patient for ongoing care management follow up and provided patient with direct contact information for care management team . Reviewed scheduled/upcoming provider appointments 09/10/19 . Spoke with [name] she was just getting off work to let  her know that [name] Lanius LCSW has sent out the paper work for [employer] worker for the patient  . Also that Home health was short staffed and that they should be contacting them this week for the start of care. . 09/11/19 . Patient states that her breathing has improved  . She is using 5 liters 6 if the weather changes . She a walking just from her bed to the bed side commode  . She states that she monitors her fluid intake . She denies any chest pain, swelling or shortness of breath . Her goal is to be able to walk from the bed to the bathroom  Patient Self Care Activities:  . Patient verbalizes understanding of plan  . Self administers medications as prescribed . Calls pharmacy for medication refills . Calls provider office for new concerns or questions  Please see past updates related to this goal by clicking on the "Past Updates" button in the selected goal          The care management team will reach out to the patient again over the next 14 days.  The patient has been provided with contact information for the care management team and has been advised to call with any health related questions or concerns.   Lazaro Arms RN, BSN, [facility] Care Management Coordinator [place] Phone: [phone] Fax: [phone]

## 2019-09-25 ENCOUNTER — Other Ambulatory Visit: Payer: Self-pay

## 2019-09-25 ENCOUNTER — Other Ambulatory Visit: Payer: Self-pay | Admitting: *Deleted

## 2019-09-25 ENCOUNTER — Ambulatory Visit: Payer: Medicare HMO

## 2019-09-25 ENCOUNTER — Telehealth: Payer: Medicare HMO

## 2019-09-25 DIAGNOSIS — M170 Bilateral primary osteoarthritis of knee: Secondary | ICD-10-CM | POA: Diagnosis not present

## 2019-09-25 DIAGNOSIS — J9621 Acute and chronic respiratory failure with hypoxia: Secondary | ICD-10-CM | POA: Diagnosis not present

## 2019-09-25 DIAGNOSIS — J9622 Acute and chronic respiratory failure with hypercapnia: Secondary | ICD-10-CM | POA: Diagnosis not present

## 2019-09-25 DIAGNOSIS — I4892 Unspecified atrial flutter: Secondary | ICD-10-CM | POA: Diagnosis not present

## 2019-09-25 DIAGNOSIS — I480 Paroxysmal atrial fibrillation: Secondary | ICD-10-CM | POA: Diagnosis not present

## 2019-09-25 DIAGNOSIS — E119 Type 2 diabetes mellitus without complications: Secondary | ICD-10-CM | POA: Diagnosis not present

## 2019-09-25 DIAGNOSIS — I110 Hypertensive heart disease with heart failure: Secondary | ICD-10-CM | POA: Diagnosis not present

## 2019-09-25 DIAGNOSIS — I5033 Acute on chronic diastolic (congestive) heart failure: Secondary | ICD-10-CM | POA: Diagnosis not present

## 2019-09-25 DIAGNOSIS — J449 Chronic obstructive pulmonary disease, unspecified: Secondary | ICD-10-CM | POA: Diagnosis not present

## 2019-09-25 MED ORDER — APIXABAN 5 MG PO TABS: 5.0000 mg | ORAL_TABLET | Freq: Two times a day (BID) | ORAL | 0 refills | Status: DC

## 2019-09-25 MED ORDER — INCRUSE ELLIPTA 62.5 MCG/INH IN AEPB: 1.0000 | INHALATION_SPRAY | Freq: Every day | RESPIRATORY_TRACT | 0 refills | Status: DC

## 2019-09-25 MED ORDER — GABAPENTIN 100 MG PO CAPS: 200.0000 mg | ORAL_CAPSULE | Freq: Three times a day (TID) | ORAL | 0 refills | Status: DC

## 2019-09-25 MED ORDER — METOPROLOL TARTRATE 25 MG PO TABS: ORAL_TABLET | ORAL | 0 refills | Status: DC

## 2019-09-26 ENCOUNTER — Telehealth: Payer: Self-pay | Admitting: *Deleted

## 2019-09-26 DIAGNOSIS — J9610 Chronic respiratory failure, unspecified whether with hypoxia or hypercapnia: Secondary | ICD-10-CM | POA: Diagnosis not present

## 2019-09-26 DIAGNOSIS — J449 Chronic obstructive pulmonary disease, unspecified: Secondary | ICD-10-CM | POA: Diagnosis not present

## 2019-09-26 DIAGNOSIS — N3281 Overactive bladder: Secondary | ICD-10-CM | POA: Diagnosis not present

## 2019-09-26 DIAGNOSIS — J9611 Chronic respiratory failure with hypoxia: Secondary | ICD-10-CM | POA: Diagnosis not present

## 2019-09-26 DIAGNOSIS — E662 Morbid (severe) obesity with alveolar hypoventilation: Secondary | ICD-10-CM | POA: Diagnosis not present

## 2019-09-26 NOTE — Telephone Encounter (Signed)
Gracee from [facility] calling for PT verbal orders as follows:  1 time(s) weekly for 1 week(s), then 2 time(s) weekly for 4 week(s), then 1 time(s) weekly for 4 week(s)  You can leave verbal orders on confidential voicemail.  Christen Bame, CMA

## 2019-09-30 NOTE — Telephone Encounter (Signed)
Left voicemail with the verbal orders  [name] MD PGY-3 Family Medicine Resident

## 2019-10-01 ENCOUNTER — Other Ambulatory Visit: Payer: Self-pay

## 2019-10-01 ENCOUNTER — Telehealth (INDEPENDENT_AMBULATORY_CARE_PROVIDER_SITE_OTHER): Payer: Medicare HMO | Admitting: Family Medicine

## 2019-10-01 DIAGNOSIS — J302 Other seasonal allergic rhinitis: Secondary | ICD-10-CM | POA: Diagnosis not present

## 2019-10-01 MED ORDER — LEVOCETIRIZINE DIHYDROCHLORIDE 5 MG PO TABS: 5.0000 mg | ORAL_TABLET | Freq: Every evening | ORAL | 0 refills | Status: DC

## 2019-10-02 ENCOUNTER — Other Ambulatory Visit: Payer: Self-pay | Admitting: Family Medicine

## 2019-10-02 ENCOUNTER — Other Ambulatory Visit: Payer: Self-pay

## 2019-10-02 DIAGNOSIS — I5033 Acute on chronic diastolic (congestive) heart failure: Secondary | ICD-10-CM | POA: Diagnosis not present

## 2019-10-02 DIAGNOSIS — I110 Hypertensive heart disease with heart failure: Secondary | ICD-10-CM | POA: Diagnosis not present

## 2019-10-02 DIAGNOSIS — J9621 Acute and chronic respiratory failure with hypoxia: Secondary | ICD-10-CM | POA: Diagnosis not present

## 2019-10-02 DIAGNOSIS — I4892 Unspecified atrial flutter: Secondary | ICD-10-CM | POA: Diagnosis not present

## 2019-10-02 DIAGNOSIS — J449 Chronic obstructive pulmonary disease, unspecified: Secondary | ICD-10-CM | POA: Diagnosis not present

## 2019-10-02 DIAGNOSIS — I480 Paroxysmal atrial fibrillation: Secondary | ICD-10-CM | POA: Diagnosis not present

## 2019-10-02 DIAGNOSIS — E119 Type 2 diabetes mellitus without complications: Secondary | ICD-10-CM | POA: Diagnosis not present

## 2019-10-02 DIAGNOSIS — J9622 Acute and chronic respiratory failure with hypercapnia: Secondary | ICD-10-CM | POA: Diagnosis not present

## 2019-10-02 DIAGNOSIS — M170 Bilateral primary osteoarthritis of knee: Secondary | ICD-10-CM | POA: Diagnosis not present

## 2019-10-02 MED ORDER — FUROSEMIDE 40 MG PO TABS: 40.0000 mg | ORAL_TABLET | Freq: Every day | ORAL | 0 refills | Status: DC

## 2019-10-02 MED ORDER — VENTOLIN HFA 108 (90 BASE) MCG/ACT IN AERS: INHALATION_SPRAY | RESPIRATORY_TRACT | 0 refills | Status: DC

## 2019-10-03 ENCOUNTER — Other Ambulatory Visit: Payer: Self-pay | Admitting: *Deleted

## 2019-10-03 ENCOUNTER — Other Ambulatory Visit: Payer: Self-pay

## 2019-10-03 DIAGNOSIS — J302 Other seasonal allergic rhinitis: Secondary | ICD-10-CM | POA: Insufficient documentation

## 2019-10-03 MED ORDER — OMEPRAZOLE 20 MG PO CPDR: 20.0000 mg | DELAYED_RELEASE_CAPSULE | Freq: Every day | ORAL | 0 refills | Status: DC

## 2019-10-03 NOTE — Progress Notes (Signed)
[facility] Telemedicine Visit  Patient consented to have virtual visit. Method of visit: Telephone  Encounter participants: Patient: [name] White - located at home Provider: [name] - [facility] at [facility] Others (if applicable): none  Chief Complaint: allergies  HPI: 64 year old female who presents for allergies. Patient states that she always gets bad allergies around this time of year and they've been particularly bad recently. She has been taking Claritin, Flonase, Singulair for allergies. Has been using Flonase 2 puffs each nostril 2 times per day.  Her symptoms have been rhinorrhea, watery eyes, cough.  ROS: per HPI  Pertinent PMHx: Seasonal allergies  Exam:  General: No acute distress, resting comfortably Respiratory: No shortness of breath, able speak in clear coherent sentences  Assessment/Plan:  Seasonal allergies We'll switch out Claritin for Xyzal given that she has been on Claritin for a long time this may be limiting her efficacy with this medication. Xyzal 5 mg daily. Continue Singulair and Flonase.    Time spent during visit with patient: 11 minutes

## 2019-10-03 NOTE — Addendum Note (Signed)
Addended by: Pauletta Browns on: 10/03/2019 01:43 PM   Modules accepted: Orders

## 2019-10-03 NOTE — Assessment & Plan Note (Signed)
We'll switch out Claritin for Xyzal given that she has been on Claritin for a long time this may be limiting her efficacy with this medication. Xyzal 5 mg daily. Continue Singulair and Flonase.

## 2019-10-07 NOTE — Patient Instructions (Signed)
Visit Information  Goals Addressed            This Visit's Progress   . "I am feeling some better after getting out of Hospital" (pt-stated)       CARE PLAN ENTRY (see longtitudinal plan of care for additional care plan information)  Current Barriers:  . Chronic Disease Management support and education needs related to post discharge instructions, including medication review , MD follow up appointments and Home Health  Nurse Case Manager Clinical Goal(s):  Marland Kitchen Over the next 14 days, patient will verbalize understanding of plan for home health PT/OT services  Interventions:  . Evaluation of current treatment plan and patient's adherence to plan as established by provider. . Reviewed medications with patient  . Discussed plans with patient for ongoing care management follow up and provided patient with direct contact information for care management team . Reviewed scheduled/upcoming provider appointments 09/10/19 . Spoke with [name] she was just getting off work to let her know that [name] LCSW has sent out the paper work for [employer] worker for the patient  . Also that Home health was short staffed and that they should be contacting them this week for the start of care. . 09/11/19 . Patient states that her breathing has improved  . She is using 5 liters 6 if the weather changes . She a walking just from her bed to the bed side commode  . She states that she monitors her fluid intake . She denies any chest pain, swelling or shortness of breath . Her goal is to be able to walk from the bed to the bathroom . 09/25/19 . Spoke with Mrs. Swinton and she states that she feels better . She has been able [facility] and she will start PT on Mondays and Wednesdays.  She was able to speak with [facility]  to get a home aid . Marland Kitchen We discussed her medications she has them and waiting on refills that she requested  Patient Self Care Activities:  . Patient verbalizes  understanding of plan  . Self administers medications as prescribed . Calls pharmacy for medication refills . Calls provider office for new concerns or questions  Please see past updates related to this goal by clicking on the "Past Updates" button in the selected goal         Ms. Mancil was given information about Care Management services today including:  1. Care Management services include personalized support from designated clinical staff supervised by her physician, including individualized plan of care and coordination with other care providers 2. 24/7 contact phone numbers for assistance for urgent and routine care needs. 3. The patient may stop CCM services at any time (effective at the end of the month) by phone call to the office staff.  Patient agreed to services and verbal consent obtained.   The patient verbalized understanding of instructions provided today and declined a print copy of patient instruction materials.   The care management team will reach out to the patient again over the next 14 days.  The patient has been provided with contact information for the care management team and has been advised to call with any health related questions or concerns.   Lazaro Arms RN, BSN, [facility] Care Management Coordinator [place] Phone: [phone] Fax: [phone]

## 2019-10-07 NOTE — Chronic Care Management (AMB) (Signed)
Care Management   Follow Up Note   10/07/2019 Name: [name] White MRN: [id number] DOB: 17-May-1956  Referred by: [name] Dawn, MD Reason for referral : Care Coordination (Care Management F/U )   [name] White is a 64 y.o. year old female who is a primary care patient of [name] Dawn, MD. The care management team was consulted for assistance with care management and care coordination needs.    Review of patient status, including review of consultants reports, relevant laboratory and other test results, and collaboration with appropriate care team members and the patient's provider was performed as part of comprehensive patient evaluation and provision of chronic care management services.    SDOH (Social Determinants of Health) assessments performed: No See Care Plan activities for detailed interventions related to [facility])     Advanced Directives: See Care Plan and Vynca application for related entries.   Goals Addressed            This Visit's Progress   . "I am feeling some better after getting out of Hospital" (pt-stated)       CARE PLAN ENTRY (see longtitudinal plan of care for additional care plan information)  Current Barriers:  . Chronic Disease Management support and education needs related to post discharge instructions, including medication review , MD follow up appointments and Home Health  Nurse Case Manager Clinical Goal(s):  Marland Kitchen Over the next 14 days, patient will verbalize understanding of plan for home health PT/OT services  Interventions:  . Evaluation of current treatment plan and patient's adherence to plan as established by provider. . Reviewed medications with patient  . Discussed plans with patient for ongoing care management follow up and provided patient with direct contact information for care management team . Reviewed scheduled/upcoming provider appointments 09/10/19 . Spoke with [name] White she was just getting off work to let her know that  [name] Lanius LCSW has sent out the paper work for [employer] worker for the patient  . Also that Home health was short staffed and that they should be contacting them this week for the start of care. . 09/11/19 . Patient states that her breathing has improved  . She is using 5 liters 6 if the weather changes . She a walking just from her bed to the bed side commode  . She states that she monitors her fluid intake . She denies any chest pain, swelling or shortness of breath . Her goal is to be able to walk from the bed to the bathroom . 09/25/19 . Spoke with [name] White and she states that she feels better . She has been [facility] and she will start PT on Mondays and Wednesdays.  She was able to speak with [name] White  to get a home aid . Marland Kitchen We discussed her medications she has them and waiting on refills that she requested  Patient Self Care Activities:  . Patient verbalizes understanding of plan  . Self administers medications as prescribed . Calls pharmacy for medication refills . Calls provider office for new concerns or questions  Please see past updates related to this goal by clicking on the "Past Updates" button in the selected goal          The care management team will reach out to the patient again over the next 14 days.  The patient has been provided with contact information for the care management team and has been advised to call with any health related questions  or concerns.   Lazaro Arms RN, BSN, [facility] Care Management Coordinator [place] Phone: [phone] Fax: [phone]

## 2019-10-08 ENCOUNTER — Telehealth: Payer: Self-pay

## 2019-10-08 DIAGNOSIS — J9611 Chronic respiratory failure with hypoxia: Secondary | ICD-10-CM | POA: Diagnosis not present

## 2019-10-08 NOTE — Telephone Encounter (Signed)
Myriam Jacobson with [facility], calls to report that patient cancelled PT session for yesterday, 10/07/19. PT will attempt to complete scheduled visit on Wednesday 10/09/19.   FYI to PCP  Talbot Grumbling, RN

## 2019-10-09 ENCOUNTER — Other Ambulatory Visit: Payer: Self-pay

## 2019-10-09 ENCOUNTER — Ambulatory Visit: Payer: Medicare HMO

## 2019-10-09 NOTE — Chronic Care Management (AMB) (Signed)
  Care Management   Outreach Note  10/09/2019 Name: [name] White MRN: [id number] DOB: [birth year]  Referred by: [name], MD Reason for referral : Care Coordination (Care Management RNCM F/U)   An unsuccessful telephone outreach was attempted today. The patient was referred to the case management team for assistance with care management and care coordination.   Follow Up Plan: A HIPPA compliant phone message was left for the patient providing contact information and requesting a return call.  The care management team will reach out to the patient again over the next 7-14 days.   Lazaro Arms RN, BSN, [facility] Care Management Coordinator [place] Phone: [phone] Fax: [phone]

## 2019-10-14 ENCOUNTER — Telehealth: Payer: Self-pay

## 2019-10-14 NOTE — Telephone Encounter (Signed)
Patient calls nurse line regarding increasing shortness of breath. Patient reports that O2 saturations have dropped to 85%. Patient is unable to speak in full sentences without pausing to catch her breath. Advised patient due to worsening symptoms to be evaluated in ED.   FYI to PCP  Talbot Grumbling, RN

## 2019-10-15 ENCOUNTER — Telehealth: Payer: Medicare HMO

## 2019-10-15 ENCOUNTER — Other Ambulatory Visit: Payer: Self-pay

## 2019-10-15 ENCOUNTER — Ambulatory Visit: Payer: Medicare HMO

## 2019-10-15 NOTE — Chronic Care Management (AMB) (Signed)
  Care Management   Outreach Note  10/15/2019 Name: [name] White MRN: [id number] DOB: 06-03-1956  Referred by: [name], MD Reason for referral : Care Coordination (Care Management RNCM F/U 2nd attempt)   A second unsuccessful telephone outreach was attempted today. The patient was referred to the case management team for assistance with care management and care coordination.   Follow Up Plan: A HIPPA compliant phone message was left for the patient providing contact information and requesting a return call.  The care management team will reach out to the patient again over the next 3-5 days.   Lazaro Arms RN, BSN, [facility] Care Management Coordinator [place] Phone: [phone] Fax: [phone]

## 2019-10-16 ENCOUNTER — Ambulatory Visit: Payer: Self-pay | Admitting: Licensed Clinical Social Worker

## 2019-10-16 NOTE — Chronic Care Management (AMB) (Signed)
   Social Work  Care Management Collaboration 10/16/2019 Name: [name] White MRN: [id number] DOB: 08-19-1955  [name] White is a 64 y.o. year old female who sees [name], MD for primary care. LCSW was consulted to assistance patient with  Level of Care Concerns and setting up [place]. Patient was not interviewed or contacted during this encounter.  CCM RN has not been successful in contacting patient.  Recommendation: After collaboration with CCM RN care manager it is determined that patient may benefit from having one contact person on the CCM at this time. She will continue to reach out to patient next week to assess ongoing CCM needs.   Review of patient status, including review of consultants reports, relevant laboratory and other test results, and collaboration with appropriate care team members and the patient's provider was performed as part of comprehensive patient evaluation and provision of chronic care management services.   Goals Addressed            This Visit's Progress   . COMPLETED: Personal Care Services   On track    CARE PLAN ENTRY (see longitudinal plan of care for additional care plan information) Current Barriers & Progress:   . Patient needs Support, Education, and Care Coordination to resolve unmet Personal Care needs . PCS referral has been faxed to [facility] . [place] informed LCSW services started for patient April 7th with [place] Work Goal(s):  .  patient will have personal care needs met as evident by having PCS Aide in the home assisting with needs. Interventions provided by LCSW : . Collaborate with CCM RN ref. Ongoing needs for patient . [employer] to review status of referral and update on PCS services Patient Self Care Activities & Deficits:  . Patient is unable to perform ADLs independently without assistance  . PCS approved and in  place Initial goal documentation      Plan:  1. RN care manager will follow up with the patient for ongoing assessment of needs .    2. If further intervention or needs are Identified  RN care manager will contact LCSW to provide interventions.   3. No further follow up required by LCSW at this time   [name], [place]   [phone] 11:27 AM

## 2019-10-17 DIAGNOSIS — M170 Bilateral primary osteoarthritis of knee: Secondary | ICD-10-CM | POA: Diagnosis not present

## 2019-10-17 DIAGNOSIS — I4892 Unspecified atrial flutter: Secondary | ICD-10-CM | POA: Diagnosis not present

## 2019-10-17 DIAGNOSIS — J449 Chronic obstructive pulmonary disease, unspecified: Secondary | ICD-10-CM | POA: Diagnosis not present

## 2019-10-17 DIAGNOSIS — I110 Hypertensive heart disease with heart failure: Secondary | ICD-10-CM | POA: Diagnosis not present

## 2019-10-17 DIAGNOSIS — I480 Paroxysmal atrial fibrillation: Secondary | ICD-10-CM | POA: Diagnosis not present

## 2019-10-17 DIAGNOSIS — J9622 Acute and chronic respiratory failure with hypercapnia: Secondary | ICD-10-CM | POA: Diagnosis not present

## 2019-10-17 DIAGNOSIS — I5033 Acute on chronic diastolic (congestive) heart failure: Secondary | ICD-10-CM | POA: Diagnosis not present

## 2019-10-17 DIAGNOSIS — E119 Type 2 diabetes mellitus without complications: Secondary | ICD-10-CM | POA: Diagnosis not present

## 2019-10-17 DIAGNOSIS — J9621 Acute and chronic respiratory failure with hypoxia: Secondary | ICD-10-CM | POA: Diagnosis not present

## 2019-10-18 ENCOUNTER — Telehealth: Payer: Self-pay

## 2019-10-18 NOTE — Telephone Encounter (Signed)
Malorie from [place] at [employer] for OT verbal orders as follows:  2 time(s) weekly for 2 week(s), then 1 time(s) weekly for 3 week(s)  You can leave verbal orders on confidential voicemail at [place], RN

## 2019-10-18 NOTE — Telephone Encounter (Signed)
Called in verbal orders.  

## 2019-10-21 ENCOUNTER — Ambulatory Visit: Payer: Medicare HMO

## 2019-10-21 ENCOUNTER — Other Ambulatory Visit: Payer: Self-pay

## 2019-10-21 DIAGNOSIS — I4892 Unspecified atrial flutter: Secondary | ICD-10-CM | POA: Diagnosis not present

## 2019-10-21 DIAGNOSIS — J9621 Acute and chronic respiratory failure with hypoxia: Secondary | ICD-10-CM | POA: Diagnosis not present

## 2019-10-21 DIAGNOSIS — M170 Bilateral primary osteoarthritis of knee: Secondary | ICD-10-CM | POA: Diagnosis not present

## 2019-10-21 DIAGNOSIS — J449 Chronic obstructive pulmonary disease, unspecified: Secondary | ICD-10-CM | POA: Diagnosis not present

## 2019-10-21 DIAGNOSIS — I110 Hypertensive heart disease with heart failure: Secondary | ICD-10-CM | POA: Diagnosis not present

## 2019-10-21 DIAGNOSIS — I5033 Acute on chronic diastolic (congestive) heart failure: Secondary | ICD-10-CM | POA: Diagnosis not present

## 2019-10-21 DIAGNOSIS — J9622 Acute and chronic respiratory failure with hypercapnia: Secondary | ICD-10-CM | POA: Diagnosis not present

## 2019-10-21 DIAGNOSIS — I480 Paroxysmal atrial fibrillation: Secondary | ICD-10-CM | POA: Diagnosis not present

## 2019-10-21 DIAGNOSIS — E119 Type 2 diabetes mellitus without complications: Secondary | ICD-10-CM | POA: Diagnosis not present

## 2019-10-21 NOTE — Chronic Care Management (AMB) (Signed)
  Care Management   Outreach Note  10/21/2019 Name: [name] White MRN: [id number] DOB: 19-Apr-1956  Referred by: [name], MD Reason for referral : Care Coordination (Care Management RNCM F/U 3rd attempt)   Third unsuccessful telephone outreach was attempted today. The patient was referred to the case management team for assistance with care management and care coordination. The patient's primary care provider has been notified of our unsuccessful attempts to make or maintain contact with the patient. The care management team is pleased to engage with this patient at any time in the future should he/she be interested in assistance from the care management team.   Follow Up Plan: A HIPPA compliant phone message was left for the patient providing contact information and requesting a return call.  The care management team is available to follow up with the patient after provider conversation with the patient regarding recommendation for care management engagement and subsequent re-referral to the care management team.      Lazaro Arms RN, BSN, [facility] Care Management Coordinator [place] Phone: [phone] Fax: [phone]

## 2019-10-24 DIAGNOSIS — J441 Chronic obstructive pulmonary disease with (acute) exacerbation: Secondary | ICD-10-CM | POA: Diagnosis not present

## 2019-10-24 DIAGNOSIS — N3281 Overactive bladder: Secondary | ICD-10-CM | POA: Diagnosis not present

## 2019-10-26 DIAGNOSIS — J449 Chronic obstructive pulmonary disease, unspecified: Secondary | ICD-10-CM | POA: Diagnosis not present

## 2019-11-01 DIAGNOSIS — J449 Chronic obstructive pulmonary disease, unspecified: Secondary | ICD-10-CM | POA: Diagnosis not present

## 2019-11-01 DIAGNOSIS — N3281 Overactive bladder: Secondary | ICD-10-CM | POA: Diagnosis not present

## 2019-11-07 DIAGNOSIS — J9611 Chronic respiratory failure with hypoxia: Secondary | ICD-10-CM | POA: Diagnosis not present

## 2019-11-08 ENCOUNTER — Other Ambulatory Visit: Payer: Self-pay | Admitting: Family Medicine

## 2019-11-09 ENCOUNTER — Inpatient Hospital Stay (HOSPITAL_COMMUNITY)
Admission: EM | Admit: 2019-11-09 | Discharge: 2019-11-13 | DRG: 189 | Disposition: A | Discharge status: Home Health | Payer: Medicare HMO | Attending: Family Medicine | Admitting: Family Medicine

## 2019-11-09 ENCOUNTER — Emergency Department (HOSPITAL_COMMUNITY): Payer: Medicare HMO

## 2019-11-09 DIAGNOSIS — I959 Hypotension, unspecified: Secondary | ICD-10-CM | POA: Diagnosis not present

## 2019-11-09 DIAGNOSIS — J9601 Acute respiratory failure with hypoxia: Secondary | ICD-10-CM | POA: Diagnosis not present

## 2019-11-09 DIAGNOSIS — R918 Other nonspecific abnormal finding of lung field: Secondary | ICD-10-CM | POA: Diagnosis not present

## 2019-11-09 DIAGNOSIS — G2581 Restless legs syndrome: Secondary | ICD-10-CM | POA: Diagnosis present

## 2019-11-09 DIAGNOSIS — Z7401 Bed confinement status: Secondary | ICD-10-CM | POA: Diagnosis not present

## 2019-11-09 DIAGNOSIS — E785 Hyperlipidemia, unspecified: Secondary | ICD-10-CM | POA: Diagnosis present

## 2019-11-09 DIAGNOSIS — E662 Morbid (severe) obesity with alveolar hypoventilation: Secondary | ICD-10-CM | POA: Diagnosis present

## 2019-11-09 DIAGNOSIS — F329 Major depressive disorder, single episode, unspecified: Secondary | ICD-10-CM | POA: Diagnosis present

## 2019-11-09 DIAGNOSIS — R000 Tachycardia, unspecified: Secondary | ICD-10-CM | POA: Diagnosis not present

## 2019-11-09 DIAGNOSIS — I4891 Unspecified atrial fibrillation: Secondary | ICD-10-CM | POA: Diagnosis not present

## 2019-11-09 DIAGNOSIS — J449 Chronic obstructive pulmonary disease, unspecified: Secondary | ICD-10-CM | POA: Diagnosis not present

## 2019-11-09 DIAGNOSIS — J9621 Acute and chronic respiratory failure with hypoxia: Secondary | ICD-10-CM

## 2019-11-09 DIAGNOSIS — Z20822 Contact with and (suspected) exposure to covid-19: Secondary | ICD-10-CM | POA: Diagnosis present

## 2019-11-09 DIAGNOSIS — M2550 Pain in unspecified joint: Secondary | ICD-10-CM | POA: Diagnosis not present

## 2019-11-09 DIAGNOSIS — I480 Paroxysmal atrial fibrillation: Secondary | ICD-10-CM | POA: Diagnosis present

## 2019-11-09 DIAGNOSIS — E119 Type 2 diabetes mellitus without complications: Secondary | ICD-10-CM | POA: Diagnosis present

## 2019-11-09 DIAGNOSIS — I5032 Chronic diastolic (congestive) heart failure: Secondary | ICD-10-CM | POA: Diagnosis present

## 2019-11-09 DIAGNOSIS — Z6841 Body Mass Index (BMI) 40.0 and over, adult: Secondary | ICD-10-CM

## 2019-11-09 DIAGNOSIS — I10 Essential (primary) hypertension: Secondary | ICD-10-CM | POA: Diagnosis not present

## 2019-11-09 DIAGNOSIS — J9600 Acute respiratory failure, unspecified whether with hypoxia or hypercapnia: Secondary | ICD-10-CM | POA: Diagnosis present

## 2019-11-09 DIAGNOSIS — Z66 Do not resuscitate: Secondary | ICD-10-CM | POA: Diagnosis present

## 2019-11-09 DIAGNOSIS — Z7901 Long term (current) use of anticoagulants: Secondary | ICD-10-CM

## 2019-11-09 DIAGNOSIS — Z87891 Personal history of nicotine dependence: Secondary | ICD-10-CM | POA: Diagnosis not present

## 2019-11-09 DIAGNOSIS — J9620 Acute and chronic respiratory failure, unspecified whether with hypoxia or hypercapnia: Principal | ICD-10-CM | POA: Diagnosis present

## 2019-11-09 DIAGNOSIS — J9602 Acute respiratory failure with hypercapnia: Secondary | ICD-10-CM | POA: Diagnosis not present

## 2019-11-09 DIAGNOSIS — J45909 Unspecified asthma, uncomplicated: Secondary | ICD-10-CM | POA: Diagnosis present

## 2019-11-09 DIAGNOSIS — J441 Chronic obstructive pulmonary disease with (acute) exacerbation: Secondary | ICD-10-CM

## 2019-11-09 DIAGNOSIS — Z79899 Other long term (current) drug therapy: Secondary | ICD-10-CM

## 2019-11-09 DIAGNOSIS — Z7951 Long term (current) use of inhaled steroids: Secondary | ICD-10-CM

## 2019-11-09 DIAGNOSIS — F419 Anxiety disorder, unspecified: Secondary | ICD-10-CM | POA: Diagnosis present

## 2019-11-09 DIAGNOSIS — Z9119 Patient's noncompliance with other medical treatment and regimen: Secondary | ICD-10-CM

## 2019-11-09 DIAGNOSIS — R0600 Dyspnea, unspecified: Secondary | ICD-10-CM

## 2019-11-09 DIAGNOSIS — Z86711 Personal history of pulmonary embolism: Secondary | ICD-10-CM | POA: Diagnosis not present

## 2019-11-09 DIAGNOSIS — J80 Acute respiratory distress syndrome: Secondary | ICD-10-CM | POA: Diagnosis not present

## 2019-11-09 DIAGNOSIS — Z9981 Dependence on supplemental oxygen: Secondary | ICD-10-CM

## 2019-11-09 DIAGNOSIS — Z90710 Acquired absence of both cervix and uterus: Secondary | ICD-10-CM | POA: Diagnosis not present

## 2019-11-09 DIAGNOSIS — R0989 Other specified symptoms and signs involving the circulatory and respiratory systems: Secondary | ICD-10-CM | POA: Diagnosis not present

## 2019-11-09 DIAGNOSIS — K219 Gastro-esophageal reflux disease without esophagitis: Secondary | ICD-10-CM | POA: Diagnosis present

## 2019-11-09 DIAGNOSIS — I110 Hypertensive heart disease with heart failure: Secondary | ICD-10-CM | POA: Diagnosis present

## 2019-11-09 DIAGNOSIS — R05 Cough: Secondary | ICD-10-CM | POA: Diagnosis not present

## 2019-11-09 DIAGNOSIS — R0602 Shortness of breath: Secondary | ICD-10-CM | POA: Diagnosis present

## 2019-11-09 DIAGNOSIS — R0603 Acute respiratory distress: Secondary | ICD-10-CM | POA: Diagnosis not present

## 2019-11-09 LAB — CBC WITH DIFFERENTIAL/PLATELET
Abs Immature Granulocytes: 0.03 10*3/uL (ref 0.00–0.07)
Basophils Absolute: 0 10*3/uL (ref 0.0–0.1)
Basophils Relative: 0 %
Eosinophils Absolute: 0.4 10*3/uL (ref 0.0–0.5)
Eosinophils Relative: 4 %
HCT: 44.4 % (ref 36.0–46.0)
Hemoglobin: 12.8 g/dL (ref 12.0–15.0)
Immature Granulocytes: 0 %
Lymphocytes Relative: 26 %
Lymphs Abs: 2.6 10*3/uL (ref 0.7–4.0)
MCH: 27.9 pg (ref 26.0–34.0)
MCHC: 28.8 g/dL — ABNORMAL LOW (ref 30.0–36.0)
MCV: 96.7 fL (ref 80.0–100.0)
Monocytes Absolute: 0.8 10*3/uL (ref 0.1–1.0)
Monocytes Relative: 8 %
Neutro Abs: 6.1 10*3/uL (ref 1.7–7.7)
Neutrophils Relative %: 62 %
Platelets: 462 10*3/uL — ABNORMAL HIGH (ref 150–400)
RBC: 4.59 MIL/uL (ref 3.87–5.11)
RDW: 22 % — ABNORMAL HIGH (ref 11.5–15.5)
WBC: 10 10*3/uL (ref 4.0–10.5)
nRBC: 0 % (ref 0.0–0.2)

## 2019-11-09 LAB — COMPREHENSIVE METABOLIC PANEL
ALT: 22 U/L (ref 0–44)
AST: 23 U/L (ref 15–41)
Albumin: 3.4 g/dL — ABNORMAL LOW (ref 3.5–5.0)
Alkaline Phosphatase: 96 U/L (ref 38–126)
Anion gap: 15 (ref 5–15)
BUN: 12 mg/dL (ref 8–23)
CO2: 32 mmol/L (ref 22–32)
Calcium: 9 mg/dL (ref 8.9–10.3)
Chloride: 99 mmol/L (ref 98–111)
Creatinine, Ser: 0.56 mg/dL (ref 0.44–1.00)
GFR calc Af Amer: >60 mL/min (ref >60)
GFR calc non Af Amer: >60 mL/min (ref >60)
Glucose, Bld: 131 mg/dL — ABNORMAL HIGH (ref 70–99)
Potassium: 3.5 mmol/L (ref 3.5–5.1)
Sodium: 146 mmol/L — ABNORMAL HIGH (ref 135–145)
Total Bilirubin: 0.5 mg/dL (ref 0.3–1.2)
Total Protein: 7.9 g/dL (ref 6.5–8.1)

## 2019-11-09 LAB — TROPONIN I (HIGH SENSITIVITY)
Troponin I (High Sensitivity): 7 ng/L (ref <18)
Troponin I (High Sensitivity): 9 ng/L (ref <18)

## 2019-11-09 LAB — SARS CORONAVIRUS 2 BY RT PCR (HOSPITAL ORDER, PERFORMED IN ~~LOC~~ HOSPITAL LAB): SARS Coronavirus 2: NEGATIVE

## 2019-11-09 LAB — BRAIN NATRIURETIC PEPTIDE: B Natriuretic Peptide: 77.3 pg/mL (ref 0.0–100.0)

## 2019-11-09 MED ORDER — MOMETASONE FURO-FORMOTEROL FUM 100-5 MCG/ACT IN AERO
2.0000 | INHALATION_SPRAY | Freq: Two times a day (BID) | RESPIRATORY_TRACT | Status: DC
  Administered 2019-11-09 – 2019-11-13 (×8): 2 via RESPIRATORY_TRACT
  Filled 2019-11-09: qty 8.8

## 2019-11-09 MED ORDER — ATORVASTATIN CALCIUM 40 MG PO TABS
40.0000 mg | ORAL_TABLET | Freq: Every day | ORAL | Status: DC
  Administered 2019-11-09 – 2019-11-13 (×5): 40 mg via ORAL
  Filled 2019-11-09 (×5): qty 1

## 2019-11-09 MED ORDER — FLECAINIDE ACETATE 100 MG PO TABS
100.0000 mg | ORAL_TABLET | Freq: Two times a day (BID) | ORAL | Status: DC
  Administered 2019-11-09 – 2019-11-13 (×8): 100 mg via ORAL
  Filled 2019-11-09 (×13): qty 1

## 2019-11-09 MED ORDER — ONDANSETRON HCL 4 MG PO TABS: 4.0000 mg | ORAL_TABLET | Freq: Four times a day (QID) | ORAL | Status: DC | PRN

## 2019-11-09 MED ORDER — SODIUM CHLORIDE 0.9 % IV SOLN
100.0000 mg | Freq: Once | INTRAVENOUS | Status: AC
  Administered 2019-11-09: 100 mg via INTRAVENOUS
  Filled 2019-11-09: qty 100

## 2019-11-09 MED ORDER — APIXABAN 5 MG PO TABS
5.0000 mg | ORAL_TABLET | Freq: Two times a day (BID) | ORAL | Status: DC
  Administered 2019-11-09 – 2019-11-13 (×9): 5 mg via ORAL
  Filled 2019-11-09 (×9): qty 1

## 2019-11-09 MED ORDER — VENLAFAXINE HCL 37.5 MG PO TABS
37.5000 mg | ORAL_TABLET | Freq: Every day | ORAL | Status: DC
  Administered 2019-11-10 – 2019-11-13 (×4): 37.5 mg via ORAL
  Filled 2019-11-09 (×4): qty 1

## 2019-11-09 MED ORDER — MONTELUKAST SODIUM 10 MG PO TABS
10.0000 mg | ORAL_TABLET | Freq: Every day | ORAL | Status: DC
  Administered 2019-11-09 – 2019-11-12 (×4): 10 mg via ORAL
  Filled 2019-11-09 (×4): qty 1

## 2019-11-09 MED ORDER — MELATONIN 3 MG PO TABS
3.0000 mg | ORAL_TABLET | Freq: Every day | ORAL | Status: DC
  Administered 2019-11-09 – 2019-11-12 (×4): 3 mg via ORAL
  Filled 2019-11-09 (×4): qty 1

## 2019-11-09 MED ORDER — CYCLOSPORINE 0.05 % OP EMUL
1.0000 [drp] | Freq: Two times a day (BID) | OPHTHALMIC | Status: DC
  Administered 2019-11-09 – 2019-11-13 (×8): 1 [drp] via OPHTHALMIC
  Filled 2019-11-09 (×11): qty 1

## 2019-11-09 MED ORDER — LACTATED RINGERS IV BOLUS
500.0000 mL | Freq: Once | INTRAVENOUS | Status: AC
  Administered 2019-11-09: 500 mL via INTRAVENOUS

## 2019-11-09 MED ORDER — UMECLIDINIUM BROMIDE 62.5 MCG/INH IN AEPB
1.0000 | INHALATION_SPRAY | Freq: Every day | RESPIRATORY_TRACT | Status: DC
  Administered 2019-11-09 – 2019-11-13 (×5): 1 via RESPIRATORY_TRACT
  Filled 2019-11-09: qty 7

## 2019-11-09 MED ORDER — VENLAFAXINE HCL ER 75 MG PO CP24
150.0000 mg | ORAL_CAPSULE | Freq: Every day | ORAL | Status: DC
  Administered 2019-11-10 – 2019-11-13 (×4): 150 mg via ORAL
  Filled 2019-11-09 (×4): qty 2

## 2019-11-09 MED ORDER — GABAPENTIN 100 MG PO CAPS
200.0000 mg | ORAL_CAPSULE | Freq: Three times a day (TID) | ORAL | Status: DC
  Administered 2019-11-09 – 2019-11-13 (×13): 200 mg via ORAL
  Filled 2019-11-09 (×13): qty 2

## 2019-11-09 MED ORDER — ONDANSETRON HCL 4 MG/2ML IJ SOLN: 4.0000 mg | Freq: Four times a day (QID) | INTRAMUSCULAR | Status: DC | PRN

## 2019-11-09 MED ORDER — ALBUTEROL (5 MG/ML) CONTINUOUS INHALATION SOLN
10.0000 mg/h | INHALATION_SOLUTION | Freq: Once | RESPIRATORY_TRACT | Status: AC
  Administered 2019-11-09: 10 mg/h via RESPIRATORY_TRACT
  Filled 2019-11-09: qty 20

## 2019-11-09 MED ORDER — FUROSEMIDE 40 MG PO TABS
40.0000 mg | ORAL_TABLET | Freq: Every day | ORAL | Status: DC
  Administered 2019-11-09 – 2019-11-13 (×5): 40 mg via ORAL
  Filled 2019-11-09 (×5): qty 1

## 2019-11-09 MED ORDER — METOPROLOL TARTRATE 12.5 MG HALF TABLET
12.5000 mg | ORAL_TABLET | Freq: Two times a day (BID) | ORAL | Status: DC
  Administered 2019-11-09 – 2019-11-13 (×9): 12.5 mg via ORAL
  Filled 2019-11-09 (×9): qty 1

## 2019-11-09 MED ORDER — DIPHENHYDRAMINE-ZINC ACETATE 2-0.1 % EX CREA
TOPICAL_CREAM | Freq: Two times a day (BID) | CUTANEOUS | Status: DC | PRN
  Filled 2019-11-09: qty 28

## 2019-11-09 MED ORDER — DILTIAZEM HCL ER COATED BEADS 120 MG PO CP24
120.0000 mg | ORAL_CAPSULE | Freq: Every day | ORAL | Status: DC
  Administered 2019-11-09 – 2019-11-12 (×4): 120 mg via ORAL
  Filled 2019-11-09 (×4): qty 1

## 2019-11-09 MED ORDER — PANTOPRAZOLE SODIUM 40 MG PO TBEC
40.0000 mg | DELAYED_RELEASE_TABLET | Freq: Every day | ORAL | Status: DC
  Administered 2019-11-09 – 2019-11-13 (×5): 40 mg via ORAL
  Filled 2019-11-09 (×5): qty 1

## 2019-11-09 MED ORDER — IPRATROPIUM-ALBUTEROL 0.5-2.5 (3) MG/3ML IN SOLN
3.0000 mL | Freq: Four times a day (QID) | RESPIRATORY_TRACT | Status: DC | PRN
  Administered 2019-11-09 – 2019-11-13 (×10): 3 mL via RESPIRATORY_TRACT
  Filled 2019-11-09 (×10): qty 3

## 2019-11-09 MED ORDER — ENOXAPARIN SODIUM 40 MG/0.4ML ~~LOC~~ SOLN: 40.0000 mg | SUBCUTANEOUS | Status: DC

## 2019-11-09 MED ORDER — LORATADINE 10 MG PO TABS
10.0000 mg | ORAL_TABLET | Freq: Every day | ORAL | Status: DC
  Administered 2019-11-09 – 2019-11-13 (×5): 10 mg via ORAL
  Filled 2019-11-09 (×5): qty 1

## 2019-11-09 NOTE — ED Notes (Addendum)
Attempted to call report x 1  
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
## 2019-11-09 NOTE — Progress Notes (Addendum)
FPTS Interim Progress Note  S: patient is now off BiPap, doing well on 7L HFNC. She reports itching on her back and requests medication for it. Recently, pulse increased to 130s, but while in room was down to 106-107. Patient received Albuterol overnight.  O: BP 125/82   Pulse (!) 132   Temp 98.3 F (36.8 C)   Resp 20   SpO2 98%   Cardiac: irregular rhythm, fast rate. No m/r/g Respiratory: speaking in full sentences, tachypneic, mild wheezing diffusely with good overall air movement, not using accessory muscles to breathe Derm: excoriations on back consistent with scratching  A/P: [name] White is a 64 yo woman here for AoC respiratory failure. Improved with bipap, now stable on 7L HFNC. - start heart healthy diet now off BiPap - restart home medications held for NPO (while on BiPap), including metoprolol - patient has not gotten relief from itching with levocetirizine. Will try loratidine while in the hospital, which has helped in the past. Will also use benadryl cr 2% on excoriations on back for topical symptomatic relief. - hgb WNL at 12.8, patient can d/c fe supplements  [name] Damme, MD 11/09/2019, 11:06 AM PGY-1, [facility] Service pager [phone]

## 2019-11-09 NOTE — Plan of Care (Signed)
  Problem: Education: Goal: Knowledge of disease or condition will improve Outcome: Progressing   Problem: Activity: Goal: Ability to tolerate increased activity will improve Outcome: Progressing   

## 2019-11-09 NOTE — Progress Notes (Signed)
   11/09/19 1000  Assess: MEWS Score  Temp 98.3 F (36.8 C)  BP 125/82  Pulse Rate (!) 132  ECG Heart Rate (!) 132  Resp 20  SpO2 98 %  O2 Device HFNC  O2 Flow Rate (L/min) 7 L/min  Assess: MEWS Score  MEWS Temp 0  MEWS Systolic 0  MEWS Pulse 3  MEWS RR 0  MEWS LOC 0  MEWS Score 3  MEWS Score Color Yellow  Assess: if the MEWS score is Yellow or Red  Were vital signs taken at a resting state? Yes  Focused Assessment Documented focused assessment  Early Detection of Sepsis Score *See Row Information* Low  MEWS guidelines implemented *See Row Information* No, previously yellow, continue vital signs every 4 hours  Treat  MEWS Interventions Other (Comment) (notified resident on call)  Take Vital Signs  Increase Vital Sign Frequency  Yellow: Q 2hr X 2 then Q 4hr X 2, if remains yellow, continue Q 4hrs  Escalate  MEWS: Escalate Yellow: discuss with charge nurse/RN and consider discussing with provider and RRT  Notify: Charge Nurse/RN  Name of Charge Nurse/RN Notified Wells Guiles, RN  Date Charge Nurse/RN Notified 11/09/19  Time Charge Nurse/RN Notified [id number]  Notify: Provider  Provider Name/Title C. Mathews  Date Provider Notified 11/09/19  Time Provider Notified 1040  Notification Type Page  Notification Reason Change in status  Response Other (Comment) (CAME TO BEDSIDE TO ASSESS PT)  Date of Provider Response 11/09/19  Time of Provider Response 1046  Document  Patient Outcome Other (Comment) (WAITING FOR NEW ORDERS FROM PROVIDER ON INTERVENTIONS)

## 2019-11-09 NOTE — ED Triage Notes (Signed)
Pt from home by EMS for Respiratory Distress. Pt uses bipap prn at home and attempted duoneb treatments at home. EMS reported absent lung sounds on arrival, improved with 125mg  solumedrol, 2g Mag, and CPAP. Lung sounds diminished. Pt briefly placed on NRB to transport into ED, nonproductive coughing, increased WOB. Sats were 96% pta bipap placed on arrival to room with improvement

## 2019-11-09 NOTE — ED Provider Notes (Signed)
Emergency Department Provider Note  I have reviewed the triage vital signs and the nursing notes.  HISTORY  Chief Complaint Respiratory Distress   HPI [name] White is a 64 y.o. female with multimedical problems documented below who presents to the emergency department today secondary to dyspnea.  Patient uses BiPAP at home as needed however just prior to arrival she had acute worsening wheezing, dyspnea and EMS was called.  She was not hypoxic but had significant increased work of breathing which remains now on arrival here.  Patient denies any fevers.  She states that she has been coughing however nonproductive.   No other associated or modifying symptoms.    Past Medical History:  Diagnosis Date  . Abdominal wall hematoma 03/06/2019  . Acute bronchitis 08/29/2018  . Acute on chronic respiratory failure with hypoxia ([place]) 08/29/2018  . Anxiety   . Asthma   . Chronic lower back pain   . COPD (chronic obstructive pulmonary disease) ([place])   . Depression   . Exposure to COVID-19 virus 11/02/2018  . Fall 02/2019  . Family history of anesthesia complication    "daughter; causes her to pass out afterwards"  . GERD (gastroesophageal reflux disease)   . History of atrial flutter 06/26/2015  . History of pulmonary embolus (PE) 11/03/2014  . Hypertension   . Hyperthyroidism   . Left medial tibial stress syndrome 12/26/2017  . Lower GI bleed   . Migraine    "monthly" (12/28/2013)  . Non-traumatic rhabdomyolysis 11/03/2014  . Obesity hypoventilation syndrome ([place]) 03/06/2019  . Osteoarthritis    "both knees; back of my neck; right pelvic bone" (12/28/2013)  . Paroxysmal A-fib ([place])   . Pulmonary embolism ([place]) 12/28/2013   "2 clots in each lung"    Patient Active Problem List   Diagnosis Date Noted  . Respiratory failure, acute-on-chronic ([place]) 11/09/2019  . Seasonal allergies 10/03/2019  . Debility 08/02/2019  . Acute hypoxemic respiratory failure ([place]) 07/21/2019  . Hypertension  associated with diabetes ([place])   . COPD (chronic obstructive pulmonary disease) ([place]) 06/21/2019  . Community acquired pneumonia 06/14/2019  . Weight gain due to medication 06/05/2019  . Acute respiratory failure with hypoxia ([place]) 05/28/2019  . Acute on chronic heart failure with preserved ejection fraction ([place]) 03/14/2019  . Acquired thrombophilia ([place]) due to A-Fib 03/07/2019  . Heart failure with preserved ejection fraction ([place]) 03/06/2019  . Obesity hypoventilation syndrome ([place]) 03/06/2019  . Lower GI bleed   . Urge incontinence 01/27/2019  . Shortness of breath 08/29/2018  . OSA (obstructive sleep apnea) 07/05/2018  . Respiratory failure ([place]) 07/05/2018  . Severe episode of recurrent major depressive disorder, without psychotic features ([place])   . MDD (major depressive disorder), recurrent episode, severe ([place]) 09/07/2015  . Atrial flutter ([place]) 07/29/2015  . Asthma 11/12/2014  . History of hypertension 11/05/2014  . Paroxysmal atrial fibrillation ([place]) 11/03/2014  . Chronic anticoagulation 11/03/2014  . Major depressive disorder 11/03/2014  . History of pulmonary embolus (PE) 11/03/2014  . MDD (major depressive disorder), recurrent severe, without psychosis ([place]) 03/05/2014  . Hot flashes 04/22/2011  . OVERACTIVE BLADDER 04/18/2008  . Osteoarthritis of both knees 04/18/2008  . Osteoarthritis involving multiple joints on both sides of body 09/14/2007  . Morbid obesity ([place]) 08/24/2006  . RESTLESS LEGS SYNDROME 08/24/2006  . HYPERTENSION, BENIGN SYSTEMIC 08/24/2006  . RHINITIS, ALLERGIC 08/24/2006  . GASTROESOPHAGEAL REFLUX, NO ESOPHAGITIS 08/24/2006    Past Surgical History:  Procedure Laterality Date  . ABDOMINAL HYSTERECTOMY    .  APPENDECTOMY    . BREAST CYST EXCISION Right   . DILATION AND CURETTAGE OF UTERUS    . ELECTROPHYSIOLOGIC STUDY N/A 05/27/2015   Procedure: A-Flutter;  Surgeon: Evans Lance, MD;  Location: [facility];  Service: Cardiovascular;   Laterality: N/A;  . EXCISIONAL HEMORRHOIDECTOMY    . NASAL SINUS SURGERY  2007  . THYROIDECTOMY, PARTIAL Right 2005  . TUBAL LIGATION    . WISDOM TOOTH EXTRACTION      Current Outpatient Rx  . Order #: [id number] Class: Normal  . Order #: [id number] Class: Normal  . Order #: [id number] Class: OTC  . Order #: [id number] Class: Normal  . Order #: [id number] Class: Normal  . Order #: [id number] Class: Print  . Order #: [id number] Class: Historical Med  . Order #: [id number] Class: Normal  . Order #: [id number] Class: Historical Med  . Order #: [id number] Class: Normal  . Order #: [id number] Class: Normal  . Order #: [id number] Class: Normal  . Order #: [id number] Class: Normal  . Order #: [id number] Class: Normal  . Order #: [id number] Class: Historical Med  . Order #: [id number] Class: Normal  . Order #: [id number] Class: Normal  . Order #: [id number] Class: Normal  . Order #: [id number] Class: Normal  . Order #: [id number] Class: Normal  . Order #: [id number] Class: Normal  . Order #: [id number] Class: Normal  . Order #: [id number] Class: Normal  . Order #: [id number] Class: No Print  . Order #: [id number] Class: Print  . Order #: [id number] Class: Normal  . Order #: [id number] Class: Historical Med  . Order #: [id number] Class: Normal  . Order #: [id number] Class: Normal  . Order #: [id number] Class: Historical Med  . Order #: [id number] Class: No Print  . Order #: [id number] Class: Normal    Allergies Caffeine, Hydralazine hcl, Hydrocodone, Ciprofloxacin, Erythromycin, Lisinopril, Oxycodone, and Sulfamethoxazole-trimethoprim  Family History  Problem Relation Age of Onset  . Osteoarthritis Mother   . Asthma Mother   . Heart failure Mother   . Breast cancer Daughter     Social History Social History   Tobacco Use  . Smoking status: Former Smoker    Packs/day: 0.25    Years: 20.00    Pack years: 5.00    Types: Cigarettes    Quit date: 07/17/1999    Years since quitting: 20.3  . Smokeless tobacco: Never Used    Substance Use Topics  . Alcohol use: Not Currently    Comment: "drank some in my 30's"  . Drug use: No    Review of Systems  All other systems negative except as documented in the HPI. All pertinent positives and negatives as reviewed in the HPI. ____________________________________________  PHYSICAL EXAM:  VITAL SIGNS: ED Triage Vitals  Enc Vitals Group     BP 11/09/19 0103 111/74     Pulse Rate 11/09/19 0103 (!) 115     Resp 11/09/19 0103 (!) 23     Temp --      Temp src --      SpO2 11/09/19 0058 96 %    Constitutional: Alert and oriented. Well appearing and in mild respiratory distress. Eyes: Conjunctivae are normal. PERRL. EOMI. Head: Atraumatic. Nose: No congestion/rhinnorhea. Mouth/Throat: Mucous membranes are moist.  Oropharynx non-erythematous. Neck: No stridor.  No meningeal signs.   Cardiovascular: tachycardic rate, irregular rhythm. Good peripheral circulation. Grossly normal heart sounds.   Respiratory: tachpneic, wheezing, increased WOB, accessory muscle use, diminished Gastrointestinal: Soft and nontender. No distention.  Musculoskeletal: No lower extremity tenderness nor edema. No gross deformities of extremities. Neurologic:  Normal speech and language. No gross focal neurologic deficits  are appreciated.  Skin:  Skin is warm, dry and intact. No rash noted.  ____________________________________________   LABS (all labs ordered are listed, but only abnormal results are displayed)  Labs Reviewed  COMPREHENSIVE METABOLIC PANEL - Abnormal; Notable for the following components:      Result Value   Sodium 146 (*)    Glucose, Bld 131 (*)    Albumin 3.4 (*)    All other components within normal limits  CBC WITH DIFFERENTIAL/PLATELET - Abnormal; Notable for the following components:   MCHC 28.8 (*)    RDW 22.0 (*)    Platelets 462 (*)    All other components within normal limits  SARS CORONAVIRUS 2 BY RT PCR (HOSPITAL ORDER, [facility])  BRAIN NATRIURETIC PEPTIDE  URINALYSIS, COMPLETE (UACMP) WITH MICROSCOPIC  TROPONIN I (HIGH SENSITIVITY)  TROPONIN I (HIGH SENSITIVITY)   ____________________________________________  EKG   EKG Interpretation  Date/Time:  Saturday Nov 09 2019 01:08:57 EDT Ventricular Rate:  104 PR Interval:    QRS Duration: 84 QT Interval:  364 QTC Calculation: 477 R Axis:   80 Text Interpretation: Atrial fibrillation Ventricular premature complex Borderline T abnormalities, anterior leads No significant change since last tracing Confirmed by Merrily Pew [phone]) on 11/09/2019 1:18:09 AM       ____________________________________________  RADIOLOGY  DG Chest Port 1 View  Result Date: 11/09/2019 CLINICAL DATA:  Shortness of breath EXAM: PORTABLE CHEST 1 VIEW COMPARISON:  August 31, 2019 FINDINGS: Again noted is cardiomegaly with retrocardiac streaky opacities. There is elevation of the right hemidiaphragm. Aortic knob calcifications. There is mild prominence of the central pulmonary vasculature. No large airspace consolidation or pleural effusion. No acute osseous abnormality. IMPRESSION: Pulmonary vascular congestion. Electronically Signed   By: Prudencio Pair M.D.   On: 11/09/2019 01:42   ____________________________________________  PROCEDURES  Procedure(s) performed:   Procedures ____________________________________________  INITIAL IMPRESSION / ASSESSMENT AND PLAN / ED COURSE   This patient presents to the ED for concern of shortness of breath, this involves an extensive number of treatment options, and is a complaint that carries with it a high risk of complications and morbidity.  The differential diagnosis includes CHF exacerbation, COPD exacerbation, ACS, PE, pneumonia or Covid   Lab Tests:   I Ordered, reviewed, and interpreted labs, which included CBC, BNP, CMP and a troponin which showed basically baseline.  BNP and troponin did not show any concern for cardiac  etiology.  Medicines ordered:   I ordered medication albuterol, BiPAP and antibiotics for COPD exacerbation  Imaging Studies ordered:   I independently visualized and interpreted imaging chest x-ray which showed diffuse interstitial findings consistent with her baseline  Additional history obtained:   Additional history obtained from EMS  Previous records obtained and reviewed in epic  Consultations Obtained:   I consulted family medicine residency for admission and discussed lab and imaging findings  Reevaluation:  After the interventions stated above, I reevaluated the patient and found patient still tachycardic but improved.  Increased work of breathing is also improved lungs are much more clear  Critical Interventions: Bipap Albuterol antibiotics  ____________________________________________  FINAL CLINICAL IMPRESSION(S) / ED DIAGNOSES  Final diagnoses:  COPD exacerbation ([place])  Acute respiratory failure, unspecified whether with hypoxia or hypercapnia ([place])    MEDICATIONS GIVEN DURING THIS VISIT:  Medications  lactated ringers bolus 500 mL (has no administration in time range)  doxycycline (VIBRAMYCIN) 100 mg in sodium chloride 0.9 % 250 mL IVPB (has no administration  in time range)  albuterol (PROVENTIL,VENTOLIN) solution continuous neb (10 mg/hr Nebulization Given 11/09/19 0114)    NEW OUTPATIENT MEDICATIONS STARTED DURING THIS VISIT:  New Prescriptions   No medications on file    Note:  This note was prepared with assistance of Dragon voice recognition software. Occasional wrong-word or sound-a-like substitutions may have occurred due to the inherent limitations of voice recognition software.   Thirza Pellicano, Corene Cornea, MD 11/09/19 0430

## 2019-11-09 NOTE — Hospital Course (Addendum)
[name] White is a 64 y.o. female with history of HTN, HFpEF, chronic respiratory failure on Bipap PRN, DM, morbid obesity and Restless legs syndrome who presents with increased and respiratory distress.   Acute on chronic respiratory distress  At baseline, uses 5-7L O2. No desaturations however patient noted to have increased WOB by EMS. Received solumedrol, magnesium and *** prior to arrival to the ED. In ED, patient noted to have tachycardia and was placed on bipap with normal oxygen saturations in the high 90s but with attempt to wean, patient would have significantly increased WOB. Patient received one time dose of doxycycline, and was started on 10 mg/hr of albuterol. She was also treated with a 583mL bolus of LR.  On day of discharge, her oxygen reliance decreased to ***.   Home medications were continued for other chronic diseases which remained stable. Would recommend outpatient sleep study.    Appointment made with pulmonology on 11/18/19 at 2 PM. Patient needs sleep study.  Hip abscess - gave topical abx, no worsening infection while I/p. Continue to follow.  Pulmonoloy - med rec inhalers

## 2019-11-10 MED ORDER — DICLOFENAC SODIUM 1 % EX GEL
4.0000 g | Freq: Four times a day (QID) | CUTANEOUS | Status: DC
  Administered 2019-11-10 – 2019-11-13 (×12): 4 g via TOPICAL
  Filled 2019-11-10: qty 100

## 2019-11-10 MED ORDER — MUPIROCIN CALCIUM 2 % EX CREA
TOPICAL_CREAM | Freq: Two times a day (BID) | CUTANEOUS | Status: DC
  Filled 2019-11-10: qty 15

## 2019-11-10 MED ORDER — LORATADINE 10 MG PO TABS: 10.0000 mg | ORAL_TABLET | Freq: Every day | ORAL | Status: DC

## 2019-11-10 MED ORDER — HYDROXYZINE HCL 10 MG PO TABS
25.0000 mg | ORAL_TABLET | Freq: Two times a day (BID) | ORAL | Status: DC | PRN
  Administered 2019-11-10 – 2019-11-13 (×4): 25 mg via ORAL
  Filled 2019-11-10 (×4): qty 3

## 2019-11-10 NOTE — Progress Notes (Signed)
Family Medicine Teaching Service Daily Progress Note Intern Pager: [phone]  Patient name: [name] White Medical record number: [id number] Date of birth: [birth year] Age: 64 y.o. Gender: female  Primary Care Provider: [name], MD Consultants: none Code Status: DNR  Pt Overview and Major Events to Date:  Admitted 5/15  Assessment and Plan:  SOB  Acute on Chronic Respiratory Failure  Uses supplemental oxygen at home 5-7 liters at all times. No desaturations since arrival, and is no longer requiring Bipap except at ngiht.  Patient s/p 125 mg Solu-Medrol and 2 g of magnesium in ED. Vital signs stable overnight.  Patient is afebrile and saturating 96-100%. Chest x-ray wtih signs of pulmonary vascular congestion without obvious infiltrates or opacities.  -s/p solumedrol x1 and doxycycline x1 -Continue DuoNebs, Incruse Ellipta -Monitor CMP -Zofran as needed -Vital signs per floor protocol  History of PE Patient has history of pulmonary embolism with chronic swelling of left lower extremity.  Current home medication includes Eliquis, but will hold this in setting of necessary BiPAP. Currently saturating 97% on Bipap. -Plan to continue home Eliquis once patient tolerating p.o.   Diabetes Hemoglobin A1c 7.8 in December.  No diabetes medications listed in patient's home meds.  It appears diabetes is managed with diet.  Blood glucose 131 on admission. -We will monitor capillary blood glucoses while inpatient and provide sliding scale insulin as needed   Paroxysmal atrial fibrillation Medication includes diltiazem 120 mg nightly, metoprolol 12.5 twice daily and flecainide 100 mg daily as well as Eliquis 5 mg.  EKG on admission shows atrial fibrillation.  Patient tachycardic on admission with heart rate ranging from 114-127. -We will monitor heart rate with vital signs -Cardiac monitoring -Continue home medications   HTN  BP this am 132/84. Patient home medications include  metoprolol, Cardizem, Lasix 40 mg.    -Monitor blood pressure with vitals -Continue home medications   OSA/OHS Patient on trilogy BiPAP at home. Patient needs outppatient sleep study that has been ordered but unclear why not yet done. - BiPAP PRN  - Recommend sleep study outpatient   Hyperlipidemia Patient home medication includes Lipitor 40 mg. -continue home meds   History of migraines Patient takes home topiramate 50 mg daily for migraine prophylaxis. -Holding for now as patient reports noncompliance, restart if needed   Morbid obesity Last known weight was 164.4 kg during last admission 2 months ago.  BMI of 60.31.  -Continue to encourage healthy lifestyle and weight management   MDD/anxiety Patient with history of anxiety.  Home medication includes Atarax 25 mg and Effexor 187.5 mg daily.  -Continue home medications   GERD Patient takes Prilosec 20 mg and Pepcid 20 mg at home -Continue home meds    FEN/GI: Heart healthy Prophylaxis:  Lovenox until patient able to tolerate home Eliquis p.o.    Disposition: patient at baseline O2 status, could go home today or tomorrow  Subjective:  Patient reports feeling better today at home baseline and is requesting home medications. She is requesting PT to stay active.   Objective: Temp:  [97.6 F (36.4 C)-98.3 F (36.8 C)] 98.1 F (36.7 C) (05/16 0411) Pulse Rate:  [36-132] 74 (05/16 0411) Resp:  [16-31] 20 (05/16 0411) BP: (106-167)/(76-128) 132/84 (05/16 0411) SpO2:  [92 %-100 %] 100 % (05/16 0411) FiO2 (%):  [48 %] 48 % (05/15 1414) Physical Exam: General: NAD, pleasant Cardiovascular: tachycardic, no m/r/g, no LE edema Respiratory: CTA BL, normal work of breathing on BIPAP Derm: no rashes appreciated Neuro:  CN II-XII grossly intact Psych: AOx3, appropriate affect  Laboratory: Recent Labs  Lab 11/09/19 0105  WBC 10.0  HGB 12.8  HCT 44.4  PLT 462*   Recent Labs  Lab 11/09/19 0105  NA 146*  K 3.5  CL 99   CO2 32  BUN 12  CREATININE 0.56  CALCIUM 9.0  PROT 7.9  BILITOT 0.5  ALKPHOS 96  ALT 22  AST 23  GLUCOSE 131*   Troponin 9 BNP 77.3  Imaging/Diagnostic Tests: DG Chest Port 1 View  Result Date: 11/09/2019 CLINICAL DATA:  Shortness of breath EXAM: PORTABLE CHEST 1 VIEW COMPARISON:  August 31, 2019 FINDINGS: Again noted is cardiomegaly with retrocardiac streaky opacities. There is elevation of the right hemidiaphragm. Aortic knob calcifications. There is mild prominence of the central pulmonary vasculature. No large airspace consolidation or pleural effusion. No acute osseous abnormality. IMPRESSION: Pulmonary vascular congestion. Electronically Signed   By: Prudencio Pair M.D.   On: 11/09/2019 01:42    Mikella Linsley, [place], DO 11/10/2019, 5:52 AM PGY-3, [place] Intern pager: [phone], text pages welcome

## 2019-11-10 NOTE — Progress Notes (Signed)
Placed patient on CPAP for the night via auto-mode wit oxygen set at 3LPM.

## 2019-11-10 NOTE — TOC Progression Note (Signed)
Transition of Care [facility]) - Progression Note    Patient Details  Name: [name] White MRN: [id number] Date of Birth: [birth year]  Transition of Care [facility]) CM/SW Contact  Claudie Leach, RN 11/10/2019, 2:10 PM  Clinical Narrative:    Patient to return home tomorrow and requests HHPT to be arranged.  She has used [facility] and wants to change agencies.  Medicare list reviewed. Patient agreeable to [facility]. Referral accepted by Cindie Sillmon.   Patient states she will need transport home in a medical van tomorrow.  She states this is her regular mode of transportation,      Barriers to Discharge: No Barriers Identified  Expected Discharge Plan and Services    HH Arranged: PT [place]: [place] Date [place]: 11/10/19 Time HH Agency Contacted: 21 Representative spoke with at [place]: [place] Determinants of Health ([place]) Interventions    Readmission Risk Interventions Readmission Risk Prevention Plan 07/22/2019 03/08/2019  Transportation Screening Complete Complete  Medication Review Press photographer) Complete Complete  PCP or Specialist appointment within 3-5 days of discharge Complete Complete  HRI or [place] Complete Complete  SW Recovery Care/Counseling Consult Complete Complete  Palliative Care Screening Not Applicable Not [place] Not Applicable Not Applicable  Some recent data might be hidden

## 2019-11-10 NOTE — Evaluation (Signed)
Physical Therapy Evaluation Patient Details Name: [name] White MRN: [id number] DOB: 1955/09/27 Today's Date: 11/10/2019   History of Present Illness  64 y.o. female admitted on 11/09/19 for SOB.  Pt dx with acute on chronic respiratory failure.  Pt with significant PMH of PE, PAF, obseisty hypoventalation syndrome, HTN, fall, COPD on 5-7L O2 [place] at baseline.   Clinical Impression  Pt, from a mobility standpoint, is likely close to her baseline.  She did fatigue quickly and HR increased to 156 during transfer OOB to the [facility].  O2 sats remained stable on 4 L O2 [place].  Pt has a goal of walking agin by her birthday in September.  She would benefit from continued home therapy.   PT to follow acutely for deficits listed below.     Follow Up Recommendations Home health PT;Supervision for mobility/OOB    Equipment Recommendations  Other (comment)(ambulance transport home)    Recommendations for Other Services   NA    Precautions / Restrictions Precautions Precautions: Fall;Other (comment) Precaution Comments: monitor O2 sats and HR      Mobility  Bed Mobility Overal bed mobility: Modified Independent             General bed mobility comments: uses bed rail for leverage, but able to get up and down   Transfers Overall transfer level: Needs assistance Equipment used: 1 person hand held assist Transfers: Sit to/from [facility] Sit to Stand: Min assist Stand pivot transfers: Min assist       General transfer comment: Min assist to stand and pivot to [facility] and back to bed, pt did not want to use RW for stability, but reaching for support with both hands.  Pt's HR up to 156 during transfer, O2 sats stable in the 90s on 4L O2 [place].           Balance Overall balance assessment: Needs assistance Sitting-balance support: Feet supported;No upper extremity supported Sitting balance-Leahy Scale: Fair     Standing balance support: Bilateral upper extremity  supported;Single extremity supported Standing balance-Leahy Scale: Poor Standing balance comment: needs at least one hand supported for balance, able to preform her own peri care                             Pertinent Vitals/Pain Pain Assessment: No/denies pain    Home Living Family/patient expects to be discharged to:: Private residence Living Arrangements: Other relatives Available Help at Discharge: Family;Available PRN/intermittently;Other (Comment) Type of Home: Apartment Home Access: Stairs to enter Entrance Stairs-Rails: None Entrance Stairs-Number of Steps: 1 Home Layout: One level Home Equipment: Walker - 2 wheels;Walker - 4 wheels;Cane - single point;Wheelchair - [employer];Tub bench;Hospital bed      Prior Function Level of Independence: Needs assistance   Gait / Transfers Assistance Needed: transfers bed to [facility] and wheelchair  ADL's / Homemaking Assistance Needed: niece fixes her food/does IADL tasks; helps her bath and get dressed  Comments: was active with home therapy, has a goal of walking for her birthday in september     [place] Hand: Right    Extremity/Trunk Assessment   Upper Extremity Assessment Upper Extremity Assessment: Overall WFL for tasks assessed    Lower Extremity Assessment Lower Extremity Assessment: Generalized weakness    Cervical / Trunk Assessment Cervical / Trunk Assessment: Normal  Communication   Communication: No difficulties  Cognition Arousal/Alertness: Awake/alert Behavior During Therapy: WFL for tasks assessed/performed Overall  Cognitive Status: Within Functional Limits for tasks assessed                                           Exercises General Exercises - Upper Extremity Shoulder Flexion: AROM;Both;10 reps General Exercises - Lower Extremity Long Arc Quad: AROM;Both;10 reps Hip Flexion/Marching: AROM;Both;10 reps Toe Raises: AROM;Both;10 reps    Assessment/Plan    PT Assessment Patient needs continued PT services  PT Problem List Decreased strength;Decreased activity tolerance;Decreased balance;Decreased mobility;Decreased knowledge of use of DME;Cardiopulmonary status limiting activity;Obesity       PT Treatment Interventions DME instruction;Gait training;Functional mobility training;Therapeutic activities;Therapeutic exercise;Balance training;Patient/family education;Wheelchair mobility training    PT Goals (Current goals can be found in the Care Plan section)  Acute Rehab PT Goals Patient Stated Goal: to be able to walk again by her birthday in September PT Goal Formulation: With patient Time For Goal Achievement: 11/24/19 Potential to Achieve Goals: Good    Frequency Min 3X/week        AM-PAC PT "6 Clicks" Mobility  Outcome Measure Help needed turning from your back to your side while in a flat bed without using bedrails?: A Little Help needed moving from lying on your back to sitting on the side of a flat bed without using bedrails?: A Little Help needed moving to and from a bed to a chair (including a wheelchair)?: A Little Help needed standing up from a chair using your arms (e.g., wheelchair or bedside chair)?: A Little Help needed to walk in hospital room?: Total Help needed climbing 3-5 steps with a railing? : Total 6 Click Score: 14    End of Session Equipment Utilized During Treatment: Oxygen Activity Tolerance: Patient limited by fatigue;Other (comment)(limited by high HR) Patient left: in bed;with call bell/phone within reach;with nursing/sitter in room Nurse Communication: Mobility status PT Visit Diagnosis: Muscle weakness (generalized) (M62.81);Difficulty in walking, not elsewhere classified (R26.2)    Time: [id number] PT Time Calculation (min) (ACUTE ONLY): 29 min   Charges:          Verdene Lennert, PT, DPT  Acute Rehabilitation [phone] pager #[phone] office     PT  Evaluation $PT Eval Moderate Complexity: 1 Mod PT Treatments $Therapeutic Activity: 8-22 mins        11/10/2019, 3:38 PM

## 2019-11-10 NOTE — Care Management Obs Status (Signed)
[place] NOTIFICATION   Patient Details  Name: JACK RISTAU MRN: [id number] Date of Birth: [birth year]   Medicare Observation Status Notification Given:  Yes    Claudie Leach, RN 11/10/2019, 2:06 PM

## 2019-11-10 NOTE — Progress Notes (Signed)
Pt placed on CPAP for the night at a pressure of 10 and 7lt FIO2 bleed in. Tol well.

## 2019-11-11 ENCOUNTER — Observation Stay (HOSPITAL_COMMUNITY): Payer: Medicare HMO

## 2019-11-11 DIAGNOSIS — R918 Other nonspecific abnormal finding of lung field: Secondary | ICD-10-CM | POA: Diagnosis not present

## 2019-11-11 DIAGNOSIS — R0602 Shortness of breath: Secondary | ICD-10-CM | POA: Diagnosis present

## 2019-11-11 DIAGNOSIS — J9600 Acute respiratory failure, unspecified whether with hypoxia or hypercapnia: Secondary | ICD-10-CM | POA: Diagnosis not present

## 2019-11-11 DIAGNOSIS — Z86711 Personal history of pulmonary embolism: Secondary | ICD-10-CM | POA: Diagnosis not present

## 2019-11-11 DIAGNOSIS — E662 Morbid (severe) obesity with alveolar hypoventilation: Secondary | ICD-10-CM | POA: Diagnosis present

## 2019-11-11 DIAGNOSIS — E119 Type 2 diabetes mellitus without complications: Secondary | ICD-10-CM | POA: Diagnosis present

## 2019-11-11 DIAGNOSIS — R05 Cough: Secondary | ICD-10-CM

## 2019-11-11 DIAGNOSIS — J9620 Acute and chronic respiratory failure, unspecified whether with hypoxia or hypercapnia: Secondary | ICD-10-CM | POA: Diagnosis present

## 2019-11-11 DIAGNOSIS — F329 Major depressive disorder, single episode, unspecified: Secondary | ICD-10-CM | POA: Diagnosis present

## 2019-11-11 DIAGNOSIS — J9621 Acute and chronic respiratory failure with hypoxia: Secondary | ICD-10-CM | POA: Diagnosis not present

## 2019-11-11 DIAGNOSIS — J45909 Unspecified asthma, uncomplicated: Secondary | ICD-10-CM | POA: Diagnosis present

## 2019-11-11 DIAGNOSIS — Z90710 Acquired absence of both cervix and uterus: Secondary | ICD-10-CM | POA: Diagnosis not present

## 2019-11-11 DIAGNOSIS — Z7901 Long term (current) use of anticoagulants: Secondary | ICD-10-CM | POA: Diagnosis not present

## 2019-11-11 DIAGNOSIS — Z66 Do not resuscitate: Secondary | ICD-10-CM | POA: Diagnosis present

## 2019-11-11 DIAGNOSIS — Z6841 Body Mass Index (BMI) 40.0 and over, adult: Secondary | ICD-10-CM | POA: Diagnosis not present

## 2019-11-11 DIAGNOSIS — Z79899 Other long term (current) drug therapy: Secondary | ICD-10-CM | POA: Diagnosis not present

## 2019-11-11 DIAGNOSIS — Z9981 Dependence on supplemental oxygen: Secondary | ICD-10-CM | POA: Diagnosis not present

## 2019-11-11 DIAGNOSIS — I480 Paroxysmal atrial fibrillation: Secondary | ICD-10-CM | POA: Diagnosis present

## 2019-11-11 DIAGNOSIS — K219 Gastro-esophageal reflux disease without esophagitis: Secondary | ICD-10-CM | POA: Diagnosis present

## 2019-11-11 DIAGNOSIS — I5032 Chronic diastolic (congestive) heart failure: Secondary | ICD-10-CM | POA: Diagnosis present

## 2019-11-11 DIAGNOSIS — G2581 Restless legs syndrome: Secondary | ICD-10-CM | POA: Diagnosis present

## 2019-11-11 DIAGNOSIS — Z7951 Long term (current) use of inhaled steroids: Secondary | ICD-10-CM | POA: Diagnosis not present

## 2019-11-11 DIAGNOSIS — R0603 Acute respiratory distress: Secondary | ICD-10-CM | POA: Diagnosis not present

## 2019-11-11 DIAGNOSIS — I110 Hypertensive heart disease with heart failure: Secondary | ICD-10-CM | POA: Diagnosis present

## 2019-11-11 DIAGNOSIS — Z20822 Contact with and (suspected) exposure to covid-19: Secondary | ICD-10-CM | POA: Diagnosis present

## 2019-11-11 DIAGNOSIS — Z87891 Personal history of nicotine dependence: Secondary | ICD-10-CM | POA: Diagnosis not present

## 2019-11-11 DIAGNOSIS — Z9119 Patient's noncompliance with other medical treatment and regimen: Secondary | ICD-10-CM | POA: Diagnosis not present

## 2019-11-11 DIAGNOSIS — F419 Anxiety disorder, unspecified: Secondary | ICD-10-CM | POA: Diagnosis present

## 2019-11-11 DIAGNOSIS — E785 Hyperlipidemia, unspecified: Secondary | ICD-10-CM | POA: Diagnosis present

## 2019-11-11 MED ORDER — ACETAMINOPHEN 325 MG PO TABS
650.0000 mg | ORAL_TABLET | Freq: Four times a day (QID) | ORAL | Status: DC | PRN
  Administered 2019-11-11 – 2019-11-12 (×4): 650 mg via ORAL
  Filled 2019-11-11 (×4): qty 2

## 2019-11-11 MED ORDER — TOPIRAMATE 25 MG PO TABS: 50.0000 mg | ORAL_TABLET | Freq: Every day | ORAL | Status: DC | PRN

## 2019-11-11 NOTE — TOC Progression Note (Signed)
Transition of Care [facility]) - Progression Note    Patient Details  Name: [name] White MRN: [id number] Date of Birth: 12/15/1955  Transition of Care [facility]) CM/SW [place], [place] Phone Number: 11/11/2019, 1:32 PM  Clinical Narrative:    Met with patient bedside. Patient expressed she does not need a WC van for transportation, that she typically transports in the non-emergency EMS. Patient expressed her wheel chair is at home. TOC will continue to follow and assist with transportation needs.    Barriers to Discharge: No Barriers Identified  Expected Discharge Plan and Services                                     HH Arranged: PT [place] Agency: [place] Date [facility] Agency Contacted: 11/10/19 Time HH Agency Contacted: 1410 Representative spoke with at [place]: [place] ([place]) Interventions    Readmission Risk Interventions Readmission Risk Prevention Plan 07/22/2019 03/08/2019  Transportation Screening Complete Complete  Medication Review Press photographer) Complete Complete  PCP or Specialist appointment within 3-5 days of discharge Complete Complete  HRI or [place] Complete Complete  SW Recovery Care/Counseling Consult Complete Complete  Palliative Care Screening Not Applicable Not [place] Not Applicable Not Applicable  Some recent data might be hidden

## 2019-11-11 NOTE — Progress Notes (Signed)
Family Medicine Teaching Service Daily Progress Note Intern Pager: [phone]  Patient name: [name] White Medical record number: [id number] Date of birth: August 16, 1955 Age: 64 y.o. Gender: female  Primary Care Provider: [name], MD Consultants: none Code Status: DNR  Pt Overview and Major Events to Date:  Admitted 5/15  Assessment and Plan:  SOB  Acute on Chronic Respiratory Failure  Has returned to baseline oxygen use.  O2 sats 91-100% yesterday and remaining VSS.  Patient reporting water entering her lungs during a breathing treatment last night.  Has audible wheezing on exam. Will repeat CXR today. Uses CPAP at bedtime.  Patient s/p 125 mg Solu-Medrol and 2 g of magnesium in ED. Vital signs stable overnight. CXR on admssion wtih signs of pulmonary vascular congestion without obvious infiltrates or opacities. BNP was <80 and COVID test was negative. Will contact pt's niece for additional information. -s/p solumedrol x1 and doxycycline x1 -Continue DuoNebs, Incruse Ellipta -Vital signs per unit routine -Home health orders in and pt accepted   Chronic Wound vs Cyst on left hip  No signs or symptoms of infection. Patient remains afebrile.  Has taken doxycycline in the past with complete resolution.   -Continue topical antibiotic BID    History of PE Patient has history of pulmonary embolism with chronic swelling of left lower extremity.  Current home medication includes Eliquis.  - Continue home Eliquis     Diabetes Hemoglobin A1c 7.8 in December.  No diabetes medications listed in patient's home meds. Appears diet controlled.  Blood glucose 131 on admission. - Monitor CBGs   Paroxysmal atrial fibrillation Stable. Tachycardic on admission but has since resolved. Medication includes diltiazem 120 mg nightly, metoprolol 12.5 twice daily and flecainide 100 mg daily as well as Eliquis 5 mg.  EKG on admission shows atrial fibrillation.   -We will monitor heart rate with vital  signs -Cardiac monitoring -Continue home medications   HTN  BP 130/107 this morning. Patient home medications include metoprolol, Cardizem, Lasix 40 mg.    -Monitor blood pressure with vitals -Continue home medications   OSA/OHS Patient on trilogy BiPAP at home. Patient needs outppatient sleep study that has been ordered but unclear why not yet done. Will inquire about patient's follow up with pulmonology.  - BiPAP PRN  - Recommend sleep study outpatient   Hyperlipidemia Patient home medication includes Lipitor 40 mg. -continue home meds   History of migraines Patient takes home topiramate 50 mg daily for migraine prophylaxis. -Give home topiramate    Morbid obesity Last known weight was 164.4 kg during last admission 2 months ago.  BMI of 60.31.  -Continue to encourage healthy lifestyle and weight management   MDD/anxiety Patient with history of anxiety.  Home medication includes Atarax 25 mg and Effexor 187.5 mg daily.  -Continue home medications   GERD Patient takes Prilosec 20 mg and Pepcid 20 mg at home -Continue home meds    FEN/GI: Heart healthy Prophylaxis:  Lovenox until patient able to tolerate home Eliquis p.o.    Disposition: plan for discharge today or tomorrow, pending CXR findings   Subjective:  Patient feeling shortness of breath after breathing treatment this morning.   Objective: Temp:  [97.7 F (36.5 C)-98.8 F (37.1 C)] 97.9 F (36.6 C) (05/17 0413) Pulse Rate:  [76-156] 82 (05/17 0711) Resp:  [16-30] 18 (05/17 0711) BP: (114-145)/(75-89) 124/75 (05/17 0413) SpO2:  [91 %-100 %] 94 % (05/17 0711) Weight:  [154 kg] 154 kg (05/16 1500) Physical Exam:  GEN:  comfortable, in no acute distress CV: regular rate and rhythm, no murmurs appreciated  RESP: no increased work of breathing, +wheezes, no rales or rhonchi  ABD: obese abdomen, non-tender MSK: non-tender, no LE edema  SKIN: warm, dry, left posterior thigh with small cyst?, no active  discharge NEURO: alert and oriented   Laboratory: Recent Labs  Lab 11/09/19 0105  WBC 10.0  HGB 12.8  HCT 44.4  PLT 462*   Recent Labs  Lab 11/09/19 0105  NA 146*  K 3.5  CL 99  CO2 32  BUN 12  CREATININE 0.56  CALCIUM 9.0  PROT 7.9  BILITOT 0.5  ALKPHOS 96  ALT 22  AST 23  GLUCOSE 131*   Troponin 9 BNP 77.3  Imaging/Diagnostic Tests: No results found.  Lyndee Hensen, DO 11/11/2019, 7:51 AM PGY-1, [place] Intern pager: [phone], text pages welcome

## 2019-11-12 DIAGNOSIS — R0603 Acute respiratory distress: Secondary | ICD-10-CM

## 2019-11-12 NOTE — TOC Progression Note (Addendum)
Transition of Care [facility]) - Progression Note    Patient Details  Name: SKYLIE HIOTT MRN: [id number] Date of Birth: Jul 30, 1955  Transition of Care [facility]) CM/SW [place], [place] Phone Number: 11/12/2019, 3:54 PM  Clinical Narrative:     CSW met with pt to address additional transport challenges after secure chat with physician. Pt reports she does not have transport to appointments. States she needs her niece to accompany her and needs transport that can accommodate wheel chair and o2 tank. Patient confirms that she has medicaid and is okay with medicaid transport as long as they can accomodate her needs. She has a pulmonologist appointment for 5/24 that CSW will attempt to assist in setting up transport.   CSW called DSS medicaid transport line [phone]. CSW informed that pt would need to complete an updated assessment prior to being eligible for medicaid transport. CSW also informed that pt would need a escort letter on file for pt to be able to bring her niece to appointments.   CSW attempted to inform pt that she needs to complete an updated DSS assessment over the phone to qualify for transport services but pt asleep with bipap.   1606: Escort letter faxed to [place] at [phone]   Barriers to Discharge: No Barriers Identified  Expected Discharge Plan and Services                                     HH Arranged: PT [place]: [place] Date [place]: 11/10/19 Time HH Agency Contacted: 1410 Representative spoke with at [place]: [place] Determinants of Health ([place]) Interventions    Readmission Risk Interventions Readmission Risk Prevention Plan 07/22/2019 03/08/2019  Transportation Screening Complete Complete  Medication Review Press photographer) Complete Complete  PCP or Specialist appointment within 3-5 days of discharge Complete Complete  HRI or [place] Complete Complete  SW Recovery  Care/Counseling Consult Complete Complete  Palliative Care Screening Not Applicable Not [place] Not Applicable Not Applicable  Some recent data might be hidden

## 2019-11-12 NOTE — Progress Notes (Signed)
Family Medicine Teaching Service Daily Progress Note Intern Pager: [phone]  Patient name: [name] White Medical record number: [id number] Date of birth: Nov 03, 1955 Age: 64 y.o. Gender: female  Primary Care Provider: [name], MD Consultants: none Code Status: DNR  Pt Overview and Major Events to Date:  Admitted 5/15  Assessment and Plan:  SOB  Acute on Chronic Respiratory Failure  Patient did not desat with conversation this morning. Patient had intermittent desaturations yesterday and overnight into the mid 80s. On baseline oxygen requirement.  Remains afebrile. CXR obtained yesterday without infiltrates and vascular congestion resolved. Patient stated she has not followed up with pulmonology or completed sleep study.  Sleep study was mailed to her home during the last admission. She uses the Trilogy ventilator at home. Will reach out to niece today as patient with concerns about locking the door when she goes home today.  -s/p solumedrol x1 and doxycycline x1 -Continue DuoNebs, Incruse Ellipta -Vital signs per unit routine -Home health orders in and pt accepted   Chronic Cyst on left hip  Non fluctuance or signs of infection on exam.  Afebrile.  -Continue topical antibiotic BID    History of PE Patient has history of pulmonary embolism with chronic swelling of left lower extremity.  Current home medication includes Eliquis.  - Continue home Eliquis     Diabetes Hemoglobin A1c 7.8 in December.  No diabetes medications listed in patient's home meds. Appears diet controlled.  Blood glucose 131 on admission.    Paroxysmal atrial fibrillation Stable. Intermittently tachycardic. Medication includes diltiazem 120 mg nightly, metoprolol 12.5 twice daily and flecainide 100 mg daily as well as Eliquis 5 mg.  EKG on admission shows atrial fibrillation.   -Cardiac monitoring -Continue home medications   HTN  Normotensive this morning. Patient home medications include  metoprolol, Cardizem, Lasix 40 mg.    -Monitor blood pressure with vitals -Continue home medications   OSA/OHS Patient on trilogy ventilator at home. Patient needs outppatient sleep study that has been ordered but unclear why not yet done. Patient did not complete sleep study and she is unclear why not.  Additionally, has not followed up with pulmonology. - CPAP qhs - Recommend sleep study and follow up with pulmonology  - Monitor BP with vitals   Hyperlipidemia Patient home medication includes Lipitor 40 mg. -continue home meds   History of migraines Patient takes home topiramate 50 mg daily for migraine prophylaxis.  -Give home topiramate    Morbid obesity Last known weight was 164.4 kg during last admission 2 months ago.  BMI of 60.31.  -Continue to encourage healthy lifestyle and weight management   MDD/anxiety Patient with history of anxiety.  Home medication includes Atarax 25 mg and Effexor 187.5 mg daily.  -Continue home medications   GERD Patient takes Prilosec 20 mg and Pepcid 20 mg at home -Continue home meds    FEN/GI: Heart healthy Prophylaxis:  Lovenox until patient able to tolerate home Eliquis p.o.    Disposition: plan for discharge today or tomorrow, pending CXR findings   Subjective:  Pa  Objective: Temp:  [98.4 F (36.9 C)-99.2 F (37.3 C)] 99.2 F (37.3 C) (05/17 1558) Pulse Rate:  [82-96] 82 (05/18 0708) Resp:  [18-20] 18 (05/18 0708) BP: (102-136)/(70-107) 136/89 (05/17 2009) SpO2:  [92 %-100 %] 98 % (05/18 0708) Physical Exam:  GEN: well appearing female, in no acute distress  CV: regular rate and rhythm, no murmurs appreciated  RESP: no increased work of breathing, wheezing present,  no rales, no rhonchi ABD: Obese abdomen, soft, nontender, nondistended.  MSK: no appreciate lower extremity edema, or tenderness SKIN: warm, dry NEURO: alert and oriented     Laboratory: Recent Labs  Lab 11/09/19 0105  WBC 10.0  HGB 12.8  HCT 44.4   PLT 462*   Recent Labs  Lab 11/09/19 0105  NA 146*  K 3.5  CL 99  CO2 32  BUN 12  CREATININE 0.56  CALCIUM 9.0  PROT 7.9  BILITOT 0.5  ALKPHOS 96  ALT 22  AST 23  GLUCOSE 131*   Troponin 9 BNP 77.3  Imaging/Diagnostic Tests: DG Chest 2 View  Result Date: 11/11/2019 CLINICAL DATA:  Respiratory distress EXAM: CHEST - 2 VIEW COMPARISON:  11/09/2019 FINDINGS: Stable cardiomegaly. Atherosclerotic calcification of the aortic knob. Low lung volumes with similar bibasilar opacities. No pleural effusion or pneumothorax. IMPRESSION: Low lung volumes with bibasilar opacities, atelectasis versus pneumonia. Electronically Signed   By: Davina Poke D.O.   On: 11/11/2019 12:16    Lyndee Hensen, DO 11/12/2019, 7:35 AM PGY-1, [place] Intern pager: [phone], text pages welcome

## 2019-11-12 NOTE — Progress Notes (Signed)
PT was ID using name, MRN and DOB

## 2019-11-12 NOTE — Progress Notes (Addendum)
Called [facility]  to schedule an outpatient follow up appointment. Patient has not followed up with pulmonology since the fall. She has had multiple admission for acute on chronic respiratory failure. Patient has not completed her sleep study but has Trilogy ventilator at home. Scheduled an appointment with Derl Barrow, NP at [facility] on 11/18/19 at 2 PM.  Reached out to social work to help patient get transport to and from her appointment.  Patient BMI 56 and uses non-emergency EMS to get home from the hospital.    Lyndee Hensen, DO PGY-1, [place] [facility] 11/12/2019 1:33 PM

## 2019-11-12 NOTE — Progress Notes (Signed)
PT Cancellation Note  Patient Details Name: [name] White MRN: [id number] DOB: 12/05/1955   Cancelled Treatment:    Reason Eval/Treat Not Completed: Other (comment) Pt declined PT today due to just got back in bed and wants to rest.  Request PT return tomorrow. Maggie Font, PT Acute Rehab Services Pager [phone] [facility] [phone] [facility] [phone]   Karlton Lemon 11/12/2019, 4:36 PM

## 2019-11-13 ENCOUNTER — Telehealth (INDEPENDENT_AMBULATORY_CARE_PROVIDER_SITE_OTHER): Payer: Medicare HMO | Admitting: Family Medicine

## 2019-11-13 MED ORDER — IPRATROPIUM-ALBUTEROL 0.5-2.5 (3) MG/3ML IN SOLN: 3.0000 mL | Freq: Four times a day (QID) | RESPIRATORY_TRACT | 0 refills | Status: DC | PRN

## 2019-11-13 MED FILL — IPRAT-ALBUT 0.5-3(2.5) MG/3: 0.5-2.5 (3) | 30 days supply | Qty: 360 | Fill #0

## 2019-11-13 NOTE — Progress Notes (Signed)
Attempted to call patient x3.  No answer after each call.  Will mark his no-show, can follow-up as needed.  Encouraged to make pulmonology follow-up.  [name] MD PGY-3 Family Medicine Resident

## 2019-11-13 NOTE — Discharge Summary (Signed)
[facility] Discharge Summary  Patient name: [name] White Medical record number: [id number] Date of birth: [birth year] Age: 64 y.o. Gender: female Date of Admission: 11/09/2019  Date of Discharge: 11/13/19  Admitting Physician: Lyndee Hensen, DO  Primary Care Provider: [name], MD Consultants: None   Indication for Hospitalization:  Acute on chronic respiratory failure    Discharge Diagnoses/Problem List:  Acute on Chronic Respiratory Failure  Asthma     Disposition: Home   Discharge Condition: Improved and stable   Discharge Exam:   GEN: conversational elderly female, in no acute distress  CV: regular rate and rhythm, no murmurs appreciated  RESP: no increased work of breathing, wearing Banks, speaking in full sentences, wheezes present, no rales or rhonchi  ABD: obese abdomen, soft, nontender, no palpable masses  MSK: no lower extremity edema SKIN: warm, dry, normal skin turgor  NEURO: alert and oriented, moves all extremities appropriately    Brief Hospital Course:  [name] White is a 64 y.o. female with history of HTN, HFpEF, chronic respiratory failure on Bipap PRN, DM, morbid obesity and Restless legs syndrome who presents with increased and respiratory distress.   Acute on chronic respiratory distress  Asthma  Patient arrived from home via EMS presenting with shortness of breath.  Patient reported cleaner used on tub upstairs came through her vents and caused increasing shortness of breath. Patient use 5-7L O2. No desaturations however patient noted to have increased work of breathing by EMS. She received solumedrol and magnesium  prior to arrival to the ED. In ED, patient noted to have tachycardia and was placed on BiPAPwith normal oxygen saturations in the high 90s but with attempt to wean, had significantly increased work of breathing. Patient received one time dose of doxycycline, and was started on 10 mg/hr of albuterol.  She was also received a 586mL bolus of LR. Prior to discharge patient was stable on her home (baseline) oxygen requirement.  She remained afebrile and did not require IV/PO antibiotics.   Patient has scheduled follow up with pulmonology with a week of discharge. It is hypothesized that her numerous admissions and underlying lung problems are 2/2 OHS/OSA and not being treated at home. Per Dr. [name] Piper note on 12/26 "I do not believe that she has significant reactive airways."   Transportation to her appointment was arranged however patient must call to confirm ride prior to her pulmonology appointment.   Left Hip Cyst/Chronic wound  Patient has chronic left hip cyst.  No fluctuance was noted on exam and topical antibiotics were provided.   Home medications were continued for other chronic diseases which remained stable.    Issues for Follow Up:  1. Appointment made with pulmonology on 11/18/19 at 2 PM. Patient has no known COPD however has multiple inhalers. Please update respiratory medications following pulmonology regarding her respiratory medications.  2. Patient needs sleep study. Limit use of steroids and antibiotics given that she DOES NOT have a diagnosis of COPD per her PFTs and prior pulmonology consultations. 3. Reassess left hip wound at follow up visit.      Significant Procedures:  None   Significant Labs and Imaging:   Recent Labs  Lab 11/09/19 0105  WBC 10.0  HGB 12.8  HCT 44.4  PLT 462*   Recent Labs  Lab 11/09/19 0105  NA 146*  K 3.5  CL 99  CO2 32  GLUCOSE 131*  BUN 12  CREATININE 0.56  CALCIUM 9.0  ALKPHOS 96  AST  23  ALT 22  ALBUMIN 3.4*    DG Chest 2 View  Result Date: 11/11/2019 CLINICAL DATA:  Respiratory distress EXAM: CHEST - 2 VIEW COMPARISON:  11/09/2019 FINDINGS: Stable cardiomegaly. Atherosclerotic calcification of the aortic knob. Low lung volumes with similar bibasilar opacities. No pleural effusion or pneumothorax. IMPRESSION: Low lung  volumes with bibasilar opacities, atelectasis versus pneumonia. Electronically Signed   By: Davina Poke D.O.   On: 11/11/2019 12:16     Results/Tests Pending at Time of Discharge: None   Discharge Medications:  Allergies as of 11/13/2019      Reactions   Caffeine Other (See Comments)   Migraine   Hydralazine Hcl Other (See Comments)   Other reaction(s): Other (See Comments) AKI leading to rhabdo and electrolyte abnormalities   Hydrocodone Nausea And Vomiting   Headache   Ciprofloxacin Hives, Rash   Erythromycin Hives, Rash   Lisinopril Cough   Oxycodone Nausea And Vomiting   Headache   Sulfamethoxazole-trimethoprim Rash      Medication List    STOP taking these medications   guaiFENesin-dextromethorphan 100-10 MG/5ML syrup Commonly known as: ROBITUSSIN DM   polyethylene glycol 17 g packet Commonly known as: MIRALAX / GLYCOLAX   topiramate 50 MG tablet Commonly known as: TOPAMAX     TAKE these medications   acetaminophen 325 MG tablet Commonly known as: TYLENOL Take 2 tablets (650 mg total) by mouth every 6 (six) hours as needed for mild pain.   apixaban 5 MG Tabs tablet Commonly known as: ELIQUIS Take 1 tablet (5 mg total) by mouth 2 (two) times daily.   atorvastatin 40 MG tablet Commonly known as: LIPITOR TAKE 1 TABLET(40 MG) BY MOUTH DAILY AT 6 PM What changed: See the new instructions.   azelastine 0.05 % ophthalmic solution Commonly known as: OPTIVAR Place 1 drop into both eyes 2 (two) times daily as needed (itchy eyes).   azelastine 0.1 % nasal spray Commonly known as: ASTELIN Place 2 sprays into both nostrils 2 (two) times daily. Use in each nostril as directed What changed:   when to take this  additional instructions   cyclobenzaprine 5 MG tablet Commonly known as: FLEXERIL Take 1 tablet (5 mg total) by mouth 2 (two) times daily as needed for muscle spasms.   cycloSPORINE 0.05 % ophthalmic emulsion Commonly known as: RESTASIS Place 1  drop into both eyes 2 (two) times daily.   diclofenac sodium 1 % Gel Commonly known as: VOLTAREN APPLY 2 GRAMS EXTERNALLY TO THE AFFECTED AREA FOUR TIMES DAILY What changed: See the new instructions.   diltiazem 120 MG 24 hr capsule Commonly known as: CARDIZEM CD Take 1 capsule (120 mg total) by mouth daily. What changed: when to take this   famotidine 20 MG tablet Commonly known as: PEPCID Take 1 tablet (20 mg total) by mouth daily.   FeroSul 325 (65 FE) MG tablet Generic drug: ferrous sulfate TAKE 1 TABLET(325 MG) BY MOUTH DAILY WITH BREAKFAST What changed: See the new instructions.   flecainide 100 MG tablet Commonly known as: TAMBOCOR TAKE 1 TABLET BY MOUTH 2 TIMES A DAY. PLEASE KEEP UPCOMING APPOINTMENT IN JANUARY BEFORE ANYMORE REFILLS.   fluticasone 50 MCG/ACT nasal spray Commonly known as: FLONASE Place 2 sprays into both nostrils daily.   furosemide 40 MG tablet Commonly known as: LASIX Take 1 tablet (40 mg total) by mouth daily.   gabapentin 100 MG capsule Commonly known as: NEURONTIN Take 2 capsules (200 mg total) by mouth 3 (three) times  daily.   hydrOXYzine 25 MG capsule Commonly known as: VISTARIL TAKE 1 CAPSULE(25 MG) BY MOUTH TWICE DAILY AS NEEDED FOR ANXIETY What changed: See the new instructions.   Incruse Ellipta 62.5 MCG/INH Aepb Generic drug: umeclidinium bromide Inhale 1 puff into the lungs daily.   ipratropium-albuterol 0.5-2.5 (3) MG/3ML Soln Commonly known as: DUONEB Take 3 mLs by nebulization every 6 (six) hours as needed (shortness of breath, wheezing). What changed: when to take this   levocetirizine 5 MG tablet Commonly known as: XYZAL Take 1 tablet (5 mg total) by mouth every evening.   melatonin 3 MG Tabs tablet Take 1 tablet (3 mg total) by mouth at bedtime as needed (sleep). What changed: when to take this   metoprolol tartrate 25 MG tablet Commonly known as: LOPRESSOR TAKE 1/2 TABLET(12.5 MG) BY MOUTH TWICE DAILY What  changed:   how much to take  how to take this  when to take this  additional instructions   mometasone-formoterol 100-5 MCG/ACT Aero Commonly known as: DULERA Inhale 2 puffs into the lungs 2 (two) times daily.   montelukast 10 MG tablet Commonly known as: SINGULAIR Take 1 tablet (10 mg total) by mouth at bedtime.   omeprazole 20 MG capsule Commonly known as: PRILOSEC Take 1 capsule (20 mg total) by mouth daily.   OXYGEN Inhale 7 L into the lungs continuous.   traMADol 50 MG tablet Commonly known as: ULTRAM Take 50 mg by mouth 2 (two) times daily as needed for moderate pain.   venlafaxine 37.5 MG tablet Commonly known as: EFFEXOR Take 37.5 mg by mouth daily with breakfast. Take with a 150 mg ER capsule every morning What changed: Another medication with the same name was changed. Make sure you understand how and when to take each.   venlafaxine XR 150 MG 24 hr capsule Commonly known as: EFFEXOR-XR Take 1 capsule (150 mg total) by mouth daily with breakfast. Take 150mg  capsule with 37.5mg  tablet What changed: additional instructions   Ventolin HFA 108 (90 Base) MCG/ACT inhaler Generic drug: albuterol INHALE 2 PUFFS INTO THE LUNGS EVERY 6 HOURS AS NEEDED FOR WHEEZING OR SHORTNESS OF BREATH What changed:   how much to take  how to take this  when to take this  reasons to take this  additional instructions  Another medication with the same name was removed. Continue taking this medication, and follow the directions you see here.       Discharge Instructions: Please refer to Patient Instructions section of EMR for full details.  Patient was counseled important signs and symptoms that should prompt return to medical care, changes in medications, dietary instructions, activity restrictions, and follow up appointments.   Follow-Up Appointments: Follow-up Information    [facility]. Go on 11/18/2019.   Specialty: Pulmonology Why: at 2 PM  Contact  information: [address] [place] [id number] [phone]       [name], MD. Schedule an appointment as soon as possible for a visit.   Specialty: Family Medicine Contact information: [id number] [place] [address] [phone]           Lyndee Hensen, DO 11/13/2019, 4:00 PM PGY-1, [place]

## 2019-11-13 NOTE — TOC Progression Note (Addendum)
Transition of Care [facility]) - Progression Note    Patient Details  Name: COLETTA LOCKNER MRN: [id number] Date of Birth: September 11, 1955  Transition of Care [facility]) CM/SW [place], [place] Phone Number: 11/13/2019, 9:49 AM  Clinical Narrative:     Met with pt to confirm she will complete Medicaid transport assessment with DSS and schedule medicaid transport for appointment on Monday. Pt will need transport home. CSW will schedule PTAR.  1130: PTAR called for next available.   TOC sign of for now.     Barriers to Discharge: No Barriers Identified  Expected Discharge Plan and Services                                     HH Arranged: PT [place] Agency: [place] Date [facility] Agency Contacted: 11/10/19 Time HH Agency Contacted: 1410 Representative spoke with at [place]: [place] ([place]) Interventions    Readmission Risk Interventions Readmission Risk Prevention Plan 07/22/2019 03/08/2019  Transportation Screening Complete Complete  Medication Review Press photographer) Complete Complete  PCP or Specialist appointment within 3-5 days of discharge Complete Complete  HRI or [place] Complete Complete  SW Recovery Care/Counseling Consult Complete Complete  Palliative Care Screening Not Applicable Not [place] Not Applicable Not Applicable  Some recent data might be hidden

## 2019-11-14 ENCOUNTER — Ambulatory Visit: Payer: Self-pay

## 2019-11-14 DIAGNOSIS — E662 Morbid (severe) obesity with alveolar hypoventilation: Secondary | ICD-10-CM | POA: Diagnosis not present

## 2019-11-14 DIAGNOSIS — J9610 Chronic respiratory failure, unspecified whether with hypoxia or hypercapnia: Secondary | ICD-10-CM | POA: Diagnosis not present

## 2019-11-14 NOTE — Chronic Care Management (AMB) (Signed)
  Care Management     Care Management Outreach Note  11/14/2019 Name: [name] White MRN: [id number] DOB: 06-Nov-1955  [name] is a 64 y.o. year old female who is a primary care patient of [name], MD .   RNCM was outreached by [place] Navigator with [place] for patient [name].   The patient was recently discharged from the hospital and did not have orders written for home health.  She asked if I would ask Dr Kris Mouton to provide orders for OT and PT for the patient.  I spoke with Dr Kris Mouton and he stated that he would write the orders for the patient.     RNCM will follow up with the patient within the next week  Lazaro Arms RN, BSN, [facility] Care Management Coordinator [place] Phone: [phone] Fax: [phone]

## 2019-11-18 ENCOUNTER — Other Ambulatory Visit: Payer: Self-pay | Admitting: Family Medicine

## 2019-11-18 ENCOUNTER — Other Ambulatory Visit: Payer: Self-pay

## 2019-11-18 ENCOUNTER — Inpatient Hospital Stay: Payer: Medicare HMO | Admitting: Pulmonary Disease

## 2019-11-18 ENCOUNTER — Other Ambulatory Visit: Payer: Self-pay | Admitting: Nurse Practitioner

## 2019-11-18 DIAGNOSIS — R5381 Other malaise: Secondary | ICD-10-CM

## 2019-11-18 NOTE — Progress Notes (Signed)
Placed order for pt and ot which was recommended on hospital discharge  [name] MD PGY-3 Family Medicine Resident

## 2019-11-19 DIAGNOSIS — E1159 Type 2 diabetes mellitus with other circulatory complications: Secondary | ICD-10-CM | POA: Diagnosis not present

## 2019-11-19 DIAGNOSIS — J449 Chronic obstructive pulmonary disease, unspecified: Secondary | ICD-10-CM | POA: Diagnosis not present

## 2019-11-19 DIAGNOSIS — J9611 Chronic respiratory failure with hypoxia: Secondary | ICD-10-CM | POA: Diagnosis not present

## 2019-11-19 DIAGNOSIS — J9601 Acute respiratory failure with hypoxia: Secondary | ICD-10-CM | POA: Diagnosis not present

## 2019-11-19 MED ORDER — APIXABAN 5 MG PO TABS: 5.0000 mg | ORAL_TABLET | Freq: Two times a day (BID) | ORAL | 0 refills | Status: DC

## 2019-11-19 MED ORDER — HYDROXYZINE PAMOATE 25 MG PO CAPS: ORAL_CAPSULE | ORAL | 0 refills | Status: DC

## 2019-11-20 ENCOUNTER — Ambulatory Visit: Payer: Medicare HMO

## 2019-11-20 ENCOUNTER — Telehealth (INDEPENDENT_AMBULATORY_CARE_PROVIDER_SITE_OTHER): Payer: Medicare HMO | Admitting: Family Medicine

## 2019-11-20 ENCOUNTER — Other Ambulatory Visit: Payer: Self-pay | Admitting: Internal Medicine

## 2019-11-20 ENCOUNTER — Other Ambulatory Visit: Payer: Self-pay

## 2019-11-20 DIAGNOSIS — R0602 Shortness of breath: Secondary | ICD-10-CM | POA: Diagnosis not present

## 2019-11-20 DIAGNOSIS — J302 Other seasonal allergic rhinitis: Secondary | ICD-10-CM

## 2019-11-20 MED ORDER — CETIRIZINE HCL 10 MG PO TABS: 10.0000 mg | ORAL_TABLET | Freq: Every day | ORAL | 0 refills | Status: DC

## 2019-11-20 MED ORDER — AZELASTINE HCL 0.05 % OP SOLN: 1.0000 [drp] | Freq: Two times a day (BID) | OPHTHALMIC | 1 refills | Status: DC | PRN

## 2019-11-20 NOTE — Chronic Care Management (AMB) (Deleted)
  Care Management   Outreach Note  11/20/2019 Name: [name] White MRN: [id number] DOB: 05/18/1956  Referred by: [name], MD Reason for referral : Care Coordination (Care Management [facility] F/U )   An unsuccessful telephone outreach was attempted today. The patient was referred to the case management team for assistance with care management and care coordination.   Follow Up Plan: A HIPPA compliant phone message was left for the patient providing contact information and requesting a return call.  The care management team will reach out to the patient again over the next 5-7 days.   Lazaro Arms RN, BSN, [facility] Care Management Coordinator [place] Phone: [phone] Fax: [phone]

## 2019-11-21 ENCOUNTER — Other Ambulatory Visit: Payer: Self-pay

## 2019-11-21 NOTE — Assessment & Plan Note (Signed)
Doing well from the standpoint.  Stable on her 5 or 6 L.  Able to make it up to an hour without needing any oxygen.  Has follow-up on 12/02/2019 with pulmonology.

## 2019-11-21 NOTE — Progress Notes (Signed)
[place] Telemedicine Visit  Patient consented to have virtual visit and was identified by name and date of birth. Method of visit: Telephone  Encounter participants: Patient: [name] White - located at home Provider: [name] - located at [facility]  Chief Complaint: Hospital follow-up  HPI: 64 year old female who presents as a hospital follow-up.  She was admitted on 11/09/2019 secondary to respiratory distress and desaturation of oxygen.  She returned to her normal state of health and she was scheduled with pulmonology to get a sleep study done on 12/02/2019.  Patient states that since she left the hospital she has been doing very well.  She is actually been able to last an hour without any oxygen on.  She is still requiring 5 or 6 L of oxygen at night.  Overall she feels that she is doing quite well.  Her only issue is that she is having some allergies.  States that she is having itchy, red eyes.  Rhinorrhea, and sinus congestion.  She had been taking Xyzal 5 mg daily along with Flonase.  ROS: per HPI  Pertinent PMHx: None  Exam:  Vitals: None taken by patient General: Well sounding, no distress Respiratory: Able speak in clear coherent sentences with no distress  Assessment/Plan:  Shortness of breath Doing well from the standpoint.  Stable on her 5 or 6 L.  Able to make it up to an hour without needing any oxygen.  Has follow-up on 12/02/2019 with pulmonology.  Seasonal allergies We will switch from Xyzal to Zyrtec 10 mg.  Continue Flonase.  Continue azelastine ophthalmic drops.    Time spent during visit with patient: 24 minutes

## 2019-11-21 NOTE — Assessment & Plan Note (Signed)
We will switch from Xyzal to Zyrtec 10 mg.  Continue Flonase.  Continue azelastine ophthalmic drops.

## 2019-11-21 NOTE — Telephone Encounter (Signed)
Also rx request for levocetirizine 5mg  tabs. Deseree Kennon Holter, CMA

## 2019-11-22 ENCOUNTER — Inpatient Hospital Stay: Payer: Medicare HMO | Admitting: Primary Care

## 2019-11-22 MED ORDER — VENLAFAXINE HCL 37.5 MG PO TABS: 37.5000 mg | ORAL_TABLET | Freq: Every day | ORAL | 0 refills | Status: DC

## 2019-11-22 MED ORDER — VENTOLIN HFA 108 (90 BASE) MCG/ACT IN AERS: INHALATION_SPRAY | RESPIRATORY_TRACT | 0 refills | Status: DC

## 2019-11-22 MED ORDER — GABAPENTIN 100 MG PO CAPS: 200.0000 mg | ORAL_CAPSULE | Freq: Three times a day (TID) | ORAL | 0 refills | Status: DC

## 2019-11-22 MED ORDER — CYCLOBENZAPRINE HCL 5 MG PO TABS: 5.0000 mg | ORAL_TABLET | Freq: Two times a day (BID) | ORAL | 0 refills | Status: DC | PRN

## 2019-11-24 ENCOUNTER — Other Ambulatory Visit: Payer: Self-pay

## 2019-11-24 ENCOUNTER — Emergency Department (HOSPITAL_COMMUNITY): Payer: Medicare HMO

## 2019-11-24 ENCOUNTER — Observation Stay (HOSPITAL_COMMUNITY)
Admission: EM | Admit: 2019-11-24 | Discharge: 2019-11-25 | Disposition: A | Discharge status: Home / Self Care | Payer: Medicare HMO | Attending: Family Medicine | Admitting: Family Medicine

## 2019-11-24 ENCOUNTER — Encounter (HOSPITAL_COMMUNITY): Payer: Self-pay | Admitting: Emergency Medicine

## 2019-11-24 DIAGNOSIS — I110 Hypertensive heart disease with heart failure: Secondary | ICD-10-CM | POA: Insufficient documentation

## 2019-11-24 DIAGNOSIS — R0603 Acute respiratory distress: Secondary | ICD-10-CM

## 2019-11-24 DIAGNOSIS — Z20822 Contact with and (suspected) exposure to covid-19: Secondary | ICD-10-CM | POA: Insufficient documentation

## 2019-11-24 DIAGNOSIS — I5032 Chronic diastolic (congestive) heart failure: Secondary | ICD-10-CM | POA: Insufficient documentation

## 2019-11-24 DIAGNOSIS — E876 Hypokalemia: Secondary | ICD-10-CM | POA: Diagnosis not present

## 2019-11-24 DIAGNOSIS — D6869 Other thrombophilia: Secondary | ICD-10-CM | POA: Diagnosis not present

## 2019-11-24 DIAGNOSIS — R7303 Prediabetes: Secondary | ICD-10-CM | POA: Insufficient documentation

## 2019-11-24 DIAGNOSIS — Z7951 Long term (current) use of inhaled steroids: Secondary | ICD-10-CM | POA: Diagnosis not present

## 2019-11-24 DIAGNOSIS — F419 Anxiety disorder, unspecified: Secondary | ICD-10-CM | POA: Insufficient documentation

## 2019-11-24 DIAGNOSIS — L729 Follicular cyst of the skin and subcutaneous tissue, unspecified: Secondary | ICD-10-CM | POA: Diagnosis not present

## 2019-11-24 DIAGNOSIS — I480 Paroxysmal atrial fibrillation: Secondary | ICD-10-CM | POA: Insufficient documentation

## 2019-11-24 DIAGNOSIS — R069 Unspecified abnormalities of breathing: Secondary | ICD-10-CM | POA: Diagnosis not present

## 2019-11-24 DIAGNOSIS — Z79899 Other long term (current) drug therapy: Secondary | ICD-10-CM | POA: Insufficient documentation

## 2019-11-24 DIAGNOSIS — Z86711 Personal history of pulmonary embolism: Secondary | ICD-10-CM | POA: Diagnosis not present

## 2019-11-24 DIAGNOSIS — G8929 Other chronic pain: Secondary | ICD-10-CM | POA: Insufficient documentation

## 2019-11-24 DIAGNOSIS — R0602 Shortness of breath: Secondary | ICD-10-CM | POA: Diagnosis present

## 2019-11-24 DIAGNOSIS — E785 Hyperlipidemia, unspecified: Secondary | ICD-10-CM | POA: Diagnosis not present

## 2019-11-24 DIAGNOSIS — E662 Morbid (severe) obesity with alveolar hypoventilation: Secondary | ICD-10-CM | POA: Diagnosis not present

## 2019-11-24 DIAGNOSIS — J9622 Acute and chronic respiratory failure with hypercapnia: Secondary | ICD-10-CM | POA: Diagnosis not present

## 2019-11-24 DIAGNOSIS — K219 Gastro-esophageal reflux disease without esophagitis: Secondary | ICD-10-CM | POA: Diagnosis not present

## 2019-11-24 DIAGNOSIS — R319 Hematuria, unspecified: Secondary | ICD-10-CM | POA: Diagnosis not present

## 2019-11-24 DIAGNOSIS — J45909 Unspecified asthma, uncomplicated: Secondary | ICD-10-CM | POA: Diagnosis not present

## 2019-11-24 DIAGNOSIS — M545 Low back pain: Secondary | ICD-10-CM | POA: Diagnosis not present

## 2019-11-24 DIAGNOSIS — R0689 Other abnormalities of breathing: Secondary | ICD-10-CM | POA: Diagnosis not present

## 2019-11-24 DIAGNOSIS — F332 Major depressive disorder, recurrent severe without psychotic features: Secondary | ICD-10-CM | POA: Insufficient documentation

## 2019-11-24 DIAGNOSIS — R609 Edema, unspecified: Secondary | ICD-10-CM | POA: Diagnosis not present

## 2019-11-24 DIAGNOSIS — Z6841 Body Mass Index (BMI) 40.0 and over, adult: Secondary | ICD-10-CM | POA: Diagnosis not present

## 2019-11-24 DIAGNOSIS — J9621 Acute and chronic respiratory failure with hypoxia: Secondary | ICD-10-CM

## 2019-11-24 DIAGNOSIS — Z9981 Dependence on supplemental oxygen: Secondary | ICD-10-CM | POA: Diagnosis not present

## 2019-11-24 DIAGNOSIS — Z7901 Long term (current) use of anticoagulants: Secondary | ICD-10-CM | POA: Insufficient documentation

## 2019-11-24 DIAGNOSIS — R0600 Dyspnea, unspecified: Secondary | ICD-10-CM | POA: Diagnosis present

## 2019-11-24 LAB — COMPREHENSIVE METABOLIC PANEL
ALT: 18 U/L (ref 0–44)
AST: 22 U/L (ref 15–41)
Albumin: 3.1 g/dL — ABNORMAL LOW (ref 3.5–5.0)
Alkaline Phosphatase: 85 U/L (ref 38–126)
Anion gap: 9 (ref 5–15)
BUN: 10 mg/dL (ref 8–23)
CO2: 31 mmol/L (ref 22–32)
Calcium: 8.1 mg/dL — ABNORMAL LOW (ref 8.9–10.3)
Chloride: 102 mmol/L (ref 98–111)
Creatinine, Ser: 0.49 mg/dL (ref 0.44–1.00)
GFR calc Af Amer: >60 mL/min (ref >60)
GFR calc non Af Amer: >60 mL/min (ref >60)
Glucose, Bld: 95 mg/dL (ref 70–99)
Potassium: 3.1 mmol/L — ABNORMAL LOW (ref 3.5–5.1)
Sodium: 142 mmol/L (ref 135–145)
Total Bilirubin: 0.5 mg/dL (ref 0.3–1.2)
Total Protein: 7.2 g/dL (ref 6.5–8.1)

## 2019-11-24 LAB — POCT I-STAT 7, (LYTES, BLD GAS, ICA,H+H)
Acid-Base Excess: 10 mmol/L — ABNORMAL HIGH (ref 0.0–2.0)
Bicarbonate: 37.7 mmol/L — ABNORMAL HIGH (ref 20.0–28.0)
Calcium, Ion: 1.18 mmol/L (ref 1.15–1.40)
HCT: 35 % — ABNORMAL LOW (ref 36.0–46.0)
Hemoglobin: 11.9 g/dL — ABNORMAL LOW (ref 12.0–15.0)
O2 Saturation: 98 %
Potassium: 3 mmol/L — ABNORMAL LOW (ref 3.5–5.1)
Sodium: 145 mmol/L (ref 135–145)
TCO2: 40 mmol/L — ABNORMAL HIGH (ref 22–32)
pCO2 arterial: 68.3 mmHg (ref 32.0–48.0)
pH, Arterial: 7.35 (ref 7.350–7.450)
pO2, Arterial: 115 mmHg — ABNORMAL HIGH (ref 83.0–108.0)

## 2019-11-24 LAB — TROPONIN I (HIGH SENSITIVITY)
Troponin I (High Sensitivity): 6 ng/L (ref <18)
Troponin I (High Sensitivity): 6 ng/L (ref <18)

## 2019-11-24 LAB — CBC WITH DIFFERENTIAL/PLATELET
Abs Immature Granulocytes: 0.03 10*3/uL (ref 0.00–0.07)
Basophils Absolute: 0 10*3/uL (ref 0.0–0.1)
Basophils Relative: 0 %
Eosinophils Absolute: 0.3 10*3/uL (ref 0.0–0.5)
Eosinophils Relative: 4 %
HCT: 37.4 % (ref 36.0–46.0)
Hemoglobin: 11 g/dL — ABNORMAL LOW (ref 12.0–15.0)
Immature Granulocytes: 0 %
Lymphocytes Relative: 19 %
Lymphs Abs: 1.5 10*3/uL (ref 0.7–4.0)
MCH: 28.4 pg (ref 26.0–34.0)
MCHC: 29.4 g/dL — ABNORMAL LOW (ref 30.0–36.0)
MCV: 96.4 fL (ref 80.0–100.0)
Monocytes Absolute: 0.6 10*3/uL (ref 0.1–1.0)
Monocytes Relative: 8 %
Neutro Abs: 5.6 10*3/uL (ref 1.7–7.7)
Neutrophils Relative %: 69 %
Platelets: 382 10*3/uL (ref 150–400)
RBC: 3.88 MIL/uL (ref 3.87–5.11)
RDW: 20.3 % — ABNORMAL HIGH (ref 11.5–15.5)
WBC: 8.1 10*3/uL (ref 4.0–10.5)
nRBC: 0 % (ref 0.0–0.2)

## 2019-11-24 LAB — URINALYSIS, ROUTINE W REFLEX MICROSCOPIC
Bacteria, UA: NONE SEEN
Bilirubin Urine: NEGATIVE
Glucose, UA: NEGATIVE mg/dL
Ketones, ur: NEGATIVE mg/dL
Nitrite: NEGATIVE
Protein, ur: NEGATIVE mg/dL
Specific Gravity, Urine: 1.006 (ref 1.005–1.030)
pH: 7 (ref 5.0–8.0)

## 2019-11-24 LAB — BRAIN NATRIURETIC PEPTIDE: B Natriuretic Peptide: 86.7 pg/mL (ref 0.0–100.0)

## 2019-11-24 LAB — SARS CORONAVIRUS 2 BY RT PCR (HOSPITAL ORDER, PERFORMED IN ~~LOC~~ HOSPITAL LAB): SARS Coronavirus 2: NEGATIVE

## 2019-11-24 LAB — TSH: TSH: 1.094 u[IU]/mL (ref 0.350–4.500)

## 2019-11-24 MED ORDER — DICLOFENAC SODIUM 1 % TD GEL
2.0000 g | Freq: Four times a day (QID) | TRANSDERMAL | Status: DC | PRN
  Filled 2019-11-24 (×2): qty 100

## 2019-11-24 MED ORDER — ATORVASTATIN CALCIUM 40 MG PO TABS
40.0000 mg | ORAL_TABLET | Freq: Every day | ORAL | Status: DC
  Administered 2019-11-24 – 2019-11-25 (×2): 40 mg via ORAL
  Filled 2019-11-24 (×2): qty 1

## 2019-11-24 MED ORDER — FLUTICASONE PROPIONATE 50 MCG/ACT NA SUSP
2.0000 | Freq: Every day | NASAL | Status: DC
  Administered 2019-11-24 – 2019-11-25 (×2): 2 via NASAL
  Filled 2019-11-24: qty 16

## 2019-11-24 MED ORDER — PANTOPRAZOLE SODIUM 40 MG PO TBEC
40.0000 mg | DELAYED_RELEASE_TABLET | Freq: Every day | ORAL | Status: DC
  Administered 2019-11-24 – 2019-11-25 (×2): 40 mg via ORAL
  Filled 2019-11-24 (×2): qty 1

## 2019-11-24 MED ORDER — MUPIROCIN CALCIUM 2 % EX CREA
TOPICAL_CREAM | Freq: Two times a day (BID) | CUTANEOUS | Status: DC
  Administered 2019-11-24: 1 via TOPICAL
  Filled 2019-11-24 (×2): qty 15

## 2019-11-24 MED ORDER — IPRATROPIUM-ALBUTEROL 0.5-2.5 (3) MG/3ML IN SOLN
3.0000 mL | Freq: Four times a day (QID) | RESPIRATORY_TRACT | Status: DC | PRN
  Administered 2019-11-24 – 2019-11-25 (×3): 3 mL via RESPIRATORY_TRACT
  Filled 2019-11-24 (×4): qty 3

## 2019-11-24 MED ORDER — FUROSEMIDE 10 MG/ML IJ SOLN
40.0000 mg | Freq: Once | INTRAMUSCULAR | Status: AC
  Administered 2019-11-24: 40 mg via INTRAVENOUS
  Filled 2019-11-24: qty 4

## 2019-11-24 MED ORDER — MONTELUKAST SODIUM 10 MG PO TABS
10.0000 mg | ORAL_TABLET | Freq: Every day | ORAL | Status: DC
  Administered 2019-11-24: 10 mg via ORAL
  Filled 2019-11-24 (×2): qty 1

## 2019-11-24 MED ORDER — METOPROLOL TARTRATE 25 MG PO TABS
12.5000 mg | ORAL_TABLET | Freq: Two times a day (BID) | ORAL | Status: DC
  Administered 2019-11-24 – 2019-11-25 (×3): 12.5 mg via ORAL
  Filled 2019-11-24 (×3): qty 1

## 2019-11-24 MED ORDER — POTASSIUM CHLORIDE CRYS ER 20 MEQ PO TBCR
40.0000 meq | EXTENDED_RELEASE_TABLET | Freq: Once | ORAL | Status: AC
  Administered 2019-11-24: 40 meq via ORAL
  Filled 2019-11-24: qty 2

## 2019-11-24 MED ORDER — AEROCHAMBER PLUS FLO-VU LARGE MISC: 1.0000 | Freq: Once | Status: DC

## 2019-11-24 MED ORDER — FLECAINIDE ACETATE 100 MG PO TABS
100.0000 mg | ORAL_TABLET | Freq: Two times a day (BID) | ORAL | Status: DC
  Administered 2019-11-24 – 2019-11-25 (×3): 100 mg via ORAL
  Filled 2019-11-24 (×5): qty 1

## 2019-11-24 MED ORDER — CYCLOBENZAPRINE HCL 5 MG PO TABS: 5.0000 mg | ORAL_TABLET | Freq: Two times a day (BID) | ORAL | Status: DC | PRN

## 2019-11-24 MED ORDER — GABAPENTIN 100 MG PO CAPS
200.0000 mg | ORAL_CAPSULE | Freq: Three times a day (TID) | ORAL | Status: DC
  Administered 2019-11-24 – 2019-11-25 (×4): 200 mg via ORAL
  Filled 2019-11-24 (×4): qty 2

## 2019-11-24 MED ORDER — TRAMADOL HCL 50 MG PO TABS: 50.0000 mg | ORAL_TABLET | Freq: Two times a day (BID) | ORAL | Status: DC | PRN

## 2019-11-24 MED ORDER — HYDROXYZINE PAMOATE 25 MG PO CAPS
25.0000 mg | ORAL_CAPSULE | Freq: Two times a day (BID) | ORAL | Status: DC | PRN
  Filled 2019-11-24 (×3): qty 1

## 2019-11-24 MED ORDER — VENLAFAXINE HCL 37.5 MG PO TABS
37.5000 mg | ORAL_TABLET | Freq: Every day | ORAL | Status: DC
  Administered 2019-11-25: 37.5 mg via ORAL
  Filled 2019-11-24 (×2): qty 1

## 2019-11-24 MED ORDER — AZELASTINE HCL 0.1 % NA SOLN
2.0000 | Freq: Every day | NASAL | Status: DC
  Administered 2019-11-24 – 2019-11-25 (×2): 2 via NASAL
  Filled 2019-11-24: qty 30

## 2019-11-24 MED ORDER — ACETAMINOPHEN 650 MG RE SUPP: 650.0000 mg | Freq: Four times a day (QID) | RECTAL | Status: DC | PRN

## 2019-11-24 MED ORDER — HYDROXYZINE HCL 25 MG PO TABS
25.0000 mg | ORAL_TABLET | Freq: Two times a day (BID) | ORAL | Status: DC | PRN
  Administered 2019-11-24: 25 mg via ORAL
  Filled 2019-11-24 (×2): qty 1

## 2019-11-24 MED ORDER — KETOTIFEN FUMARATE 0.025 % OP SOLN
1.0000 [drp] | Freq: Two times a day (BID) | OPHTHALMIC | Status: DC
  Administered 2019-11-24 – 2019-11-25 (×3): 1 [drp] via OPHTHALMIC
  Filled 2019-11-24: qty 5

## 2019-11-24 MED ORDER — APIXABAN 5 MG PO TABS
5.0000 mg | ORAL_TABLET | Freq: Two times a day (BID) | ORAL | Status: DC
  Administered 2019-11-24 – 2019-11-25 (×3): 5 mg via ORAL
  Filled 2019-11-24 (×5): qty 1

## 2019-11-24 MED ORDER — MOMETASONE FURO-FORMOTEROL FUM 100-5 MCG/ACT IN AERO
2.0000 | INHALATION_SPRAY | Freq: Two times a day (BID) | RESPIRATORY_TRACT | Status: DC
  Administered 2019-11-24 – 2019-11-25 (×2): 2 via RESPIRATORY_TRACT
  Filled 2019-11-24 (×2): qty 8.8

## 2019-11-24 MED ORDER — ACETAMINOPHEN 325 MG PO TABS
650.0000 mg | ORAL_TABLET | Freq: Four times a day (QID) | ORAL | Status: DC | PRN
  Administered 2019-11-25: 650 mg via ORAL
  Filled 2019-11-24 (×2): qty 2

## 2019-11-24 MED ORDER — VENLAFAXINE HCL ER 75 MG PO CP24
150.0000 mg | ORAL_CAPSULE | Freq: Every day | ORAL | Status: DC
  Administered 2019-11-25: 150 mg via ORAL
  Filled 2019-11-24: qty 2

## 2019-11-24 MED ORDER — ALBUTEROL SULFATE HFA 108 (90 BASE) MCG/ACT IN AERS
4.0000 | INHALATION_SPRAY | Freq: Once | RESPIRATORY_TRACT | Status: AC
  Administered 2019-11-24: 4 via RESPIRATORY_TRACT
  Filled 2019-11-24: qty 6.7

## 2019-11-24 MED ORDER — LORATADINE 10 MG PO TABS
10.0000 mg | ORAL_TABLET | Freq: Every day | ORAL | Status: DC
  Administered 2019-11-24 – 2019-11-25 (×2): 10 mg via ORAL
  Filled 2019-11-24 (×2): qty 1

## 2019-11-24 MED ORDER — MELATONIN 3 MG PO TABS
3.0000 mg | ORAL_TABLET | Freq: Every day | ORAL | Status: DC
  Administered 2019-11-24: 3 mg via ORAL
  Filled 2019-11-24 (×2): qty 1

## 2019-11-24 MED ORDER — FAMOTIDINE 20 MG PO TABS
20.0000 mg | ORAL_TABLET | Freq: Every day | ORAL | Status: DC
  Administered 2019-11-24 – 2019-11-25 (×2): 20 mg via ORAL
  Filled 2019-11-24 (×2): qty 1

## 2019-11-24 MED ORDER — DILTIAZEM HCL ER COATED BEADS 120 MG PO CP24
120.0000 mg | ORAL_CAPSULE | Freq: Every day | ORAL | Status: DC
  Administered 2019-11-24 – 2019-11-25 (×2): 120 mg via ORAL
  Filled 2019-11-24 (×2): qty 1

## 2019-11-24 MED ORDER — UMECLIDINIUM BROMIDE 62.5 MCG/INH IN AEPB
1.0000 | INHALATION_SPRAY | Freq: Every day | RESPIRATORY_TRACT | Status: DC
  Administered 2019-11-25: 1 via RESPIRATORY_TRACT
  Filled 2019-11-24: qty 7

## 2019-11-24 NOTE — H&P (Addendum)
[name] Admission History and Physical Service Pager: [phone]  Patient name: [name] White Medical record number: [id number] Date of birth: 05-14-1956 Age: 64 y.o. Gender: female  Primary Care Provider: [name], MD Consultants: None Code Status: Full Preferred Emergency Contact: [name] White, niece  Chief Complaint: SOB, BLEE  Assessment and Plan: [name] White is a 64 y.o. female presenting with worsening SOB, BLEE. PMH is significant for   [name] White is a 64 y.o. female presenting with acute on chronic respiratory failure. PMH is significant for HTN, overactive bladder, OA of knees, Afib (Eliquis), MDD, OSA, HFpEF & DM.    SOB  Acute on Chronic Respiratory Failure  HFpEF Patient reports that symptoms of SOB, orthopnea, and LE edema have been worsening since last night at 9 PM.  Patient treated with albuterol neb in ED and reports improvement in chest tightness. Patient with many admissions due to acute respiratory failure.  CMP reveals hypokalemia to 3.1, otherwise WNL. CBC largely normal, Hgb at baseline of 11.9.  BNP WNL at 86.7, troponin negative at 6. COVID-19 negative. UA with + leuks, but patient not complaining of dysuria; also with large hgb, micro w/ 6-10 RBCs per HPF.  Patient pCO2 is elevated to 68.3, but pH is WNL at 7.35, indicating chronic hypercarbic retention. EKG with NSR and some baseline wander/possible artifact. Vital signs significant for tachypnea to 27, now at 21. Patient is on 15L  and satting well >95%, no desaturations. Crackles present on bilateral bases, reduced inspiration diffusely, no increased WOB, mildly tachypneic. Patient now better able to lay flat after lasix given in ED. Differential diagnosis includes OHS/OSA, could also consider PNA on the ddx as patient has had multiple tx courses of abx and steroids that could make her high risk for infection. However, CXR is not consistent with an  infectious process, and shows stable mild interstitial prominence, which could reflect edema. The CXR is also without obvious infiltrates or opacities. Last echo in 05/2019 with normal EF, 60-65%, patient has HFpEF; will repeat echo this admission to rule out worsening CHF. On home lasix 40 mg PO, but increasing BLEE with orthopnea. Patient states that she has rigidly adhered to 1541mL fluid restriction and has taken her medications every day.. Of note, patient has PFTs which do not support the diagnosis of COPD. Home medications include albuterol nebulizer, Ventolin, Dulera, Singulair, Incruse, supplemental oxygen up to 5-7 L, and trilogy BiPAP qhs. Patient admitted for observation, IV lasix, and continued respiratory support. -Admit to med-surg, attending Dr. Gwendlyn Deutscher, admit for observation  -Strict I/Os -Daily weight -Repeat echo -s/p Lasix IV 40 mg. Re- dose as needed.  -Continuous pulse oximetry -Continue DuoNebs, Dulera, Incruse Ellipta, Singulair -Continue BiPAP qhs -AM CBC, BMP -Vital signs per floor protocol -Up with assistance -PT/OT eval and treat  Hypokalemia Patient being treated with Lasix 40 mg, potassium is low at 3.0 today.  Repleted with Klor-Con 40 mEq x 1 today. - AM BMP, Mg  Hematuria UA today with large hemoglobin on urine dipstick, subsequent microscopy 6-10 RBCs per HPF.  Is postmenopausal, does not report any bleeding, and denies any dysuria. -Follow-up outpatient  History of PE Patient has h/o PE w/ chronic swelling of LLE.  Home medications include Eliquis, will continue in hospital. -Continue home Eliquis  Prediabetes Hemoglobin A1c 7.8% in December 2020. Previously has been < 6.5%  No DM medications listed on home meds, managed with diet.  Blood glucose on admission pending. Obtain repeat  Hgb A1c. -Monitor glucose via BMP for now -F/U Hgb A1c  -consider SSI, CBG qAC if needed  Paroxysmal atrial fibrillation Medications include diltiazem 120 mg nightly,  metoprolol 12.5 mg twice daily and flecainide 100 mg daily as well as Eliquis 5 mg BID.  EKG on admission shows normal sinus rhythm.   -We will monitor heart rate with vital signs -Continue home medications  HTN  Home medications include metoprolol, Coreg, Lasix 40 mg p.o. daily.  Patient is currently normotensive: Most recently 136/84. -Continue to monitor -Continue home meds  OSA/OHS Patient has trilogy BiPAP at home.  She reports that she was using it regularly and had steadily worsening symptoms despite this.  - Patient to see pulmonologist on June 7  Hyperlipidemia Home medication is Lipitor 40 mg daily -Continue home meds  Morbid obesity Current weight is 154 kg, BMI 56.5.  Patient states she would like to lose weight. -Follow-up with PCP  MDD/anxiety Has history of anxiety and depression.  Home medications include Atarax and Effexor.  Patient reports being in the normal mood today. -Continue home medications  GERD Patient takes Prilosec 20 mg and Pepcid 20 mg at home -Continue home meds  Subcutaneous cyst  Pt has recurrent cyst on right buttock she believes is related to her urinary incontinence.  On exam was not able to visually appreciate cyst but pt was very tender to touch.  Has received mupirocin topical treatment in past admissions.   - mupirocin 2% BID  FEN/GI: HH diet, 1570mL fluid restriction Prophylaxis: eliquis  Disposition: med-surg  History of Present Illness:  [name] White is a 64 y.o. female presenting with SOB, reported BLEE since 9:00pm last night.  Patient reports that she feels a little lightheaded and had trouble laying flat.  She took all of her medications including Lasix 40.  She used her trilogy machine at night and was on her home oxygen of 7 L but felt short of breath with some chest tightness.  She presented to the ED this morning and after receiving albuterol, IV Lasix 40 mg, and 15 L high flow nasal cannula, reports that she is  feeling better.  Patient frequently comes in the hospital on weekends when her nieces out of town.  Patient was supposed to follow-up on the 24th with her pulmonologist, but stated that because it was greater than 90 degrees outside she could not go outside and make it to her appointment so she rescheduled for June 7.  Patient states her desire to increase her functionality at home.  She gets up several times a day to walk to the commode on the other side of her room to use the restroom.  She states she can now walk to the door of her room with the goal of eventually being able to walk to the bathroom down the hallway.    Review Of Systems: Per HPI with the following additions:   Review of Systems  Constitutional: Negative for chills and fatigue.  HENT: Negative for rhinorrhea and sneezing.   Respiratory: Positive for shortness of breath. Negative for cough and wheezing.   Cardiovascular: Positive for leg swelling. Negative for chest pain and palpitations.  Gastrointestinal: Negative for abdominal pain, diarrhea, nausea and vomiting.  Genitourinary: Negative for dysuria and urgency.  Musculoskeletal: Negative for arthralgias and joint swelling.  Skin: Negative for pallor, rash and wound.  Neurological: Positive for light-headedness. Negative for dizziness, tremors, seizures, syncope, speech difficulty, weakness, numbness and headaches.  Psychiatric/Behavioral: Negative for confusion  and sleep disturbance. The patient is not nervous/anxious.      Patient Active Problem List   Diagnosis Date Noted  . Respiratory failure, acute-on-chronic ([place]) 11/09/2019  . Seasonal allergies 10/03/2019  . Debility 08/02/2019  . Hypertension associated with diabetes ([place])   . Weight gain due to medication 06/05/2019  . Acute on chronic heart failure with preserved ejection fraction ([place]) 03/14/2019  . Acquired thrombophilia ([place]) due to A-Fib 03/07/2019  . Heart failure with preserved ejection fraction  ([place]) 03/06/2019  . Obesity hypoventilation syndrome ([place]) 03/06/2019  . Lower GI bleed   . Urge incontinence 01/27/2019  . Shortness of breath 08/29/2018  . OSA (obstructive sleep apnea) 07/05/2018  . Respiratory failure ([place]) 07/05/2018  . Severe episode of recurrent major depressive disorder, without psychotic features ([place])   . MDD (major depressive disorder), recurrent episode, severe ([place]) 09/07/2015  . Atrial flutter ([place]) 07/29/2015  . Asthma 11/12/2014  . History of hypertension 11/05/2014  . Paroxysmal atrial fibrillation ([place]) 11/03/2014  . Chronic anticoagulation 11/03/2014  . Major depressive disorder 11/03/2014  . History of pulmonary embolus (PE) 11/03/2014  . MDD (major depressive disorder), recurrent severe, without psychosis ([place]) 03/05/2014  . Hot flashes 04/22/2011  . OVERACTIVE BLADDER 04/18/2008  . Osteoarthritis of both knees 04/18/2008  . Osteoarthritis involving multiple joints on both sides of body 09/14/2007  . Morbid obesity ([place]) 08/24/2006  . RESTLESS LEGS SYNDROME 08/24/2006  . HYPERTENSION, BENIGN SYSTEMIC 08/24/2006  . RHINITIS, ALLERGIC 08/24/2006  . GASTROESOPHAGEAL REFLUX, NO ESOPHAGITIS 08/24/2006    Past Medical History: Past Medical History:  Diagnosis Date  . Abdominal wall hematoma 03/06/2019  . Acute bronchitis 08/29/2018  . Acute on chronic respiratory failure with hypoxia ([place]) 08/29/2018  . Anxiety   . Asthma   . Chronic lower back pain   . COPD (chronic obstructive pulmonary disease) ([place])   . Depression   . Exposure to COVID-19 virus 11/02/2018  . Fall 02/2019  . Family history of anesthesia complication    "daughter; causes her to pass out afterwards"  . GERD (gastroesophageal reflux disease)   . History of atrial flutter 06/26/2015  . History of pulmonary embolus (PE) 11/03/2014  . Hypertension   . Hyperthyroidism   . Left medial tibial stress syndrome 12/26/2017  . Lower GI bleed   . Migraine    "monthly" (12/28/2013)  .  Non-traumatic rhabdomyolysis 11/03/2014  . Obesity hypoventilation syndrome ([place]) 03/06/2019  . Osteoarthritis    "both knees; back of my neck; right pelvic bone" (12/28/2013)  . Paroxysmal A-fib ([place])   . Pulmonary embolism ([place]) 12/28/2013   "2 clots in each lung"    Past Surgical History: Past Surgical History:  Procedure Laterality Date  . ABDOMINAL HYSTERECTOMY    . APPENDECTOMY    . BREAST CYST EXCISION Right   . DILATION AND CURETTAGE OF UTERUS    . ELECTROPHYSIOLOGIC STUDY N/A 05/27/2015   Procedure: A-Flutter;  Surgeon: Evans Lance, MD;  Location: [facility];  Service: Cardiovascular;  Laterality: N/A;  . EXCISIONAL HEMORRHOIDECTOMY    . NASAL SINUS SURGERY  2007  . THYROIDECTOMY, PARTIAL Right 2005  . TUBAL LIGATION    . WISDOM TOOTH EXTRACTION      Social History: Social History   Tobacco Use  . Smoking status: Former Smoker    Packs/day: 0.25    Years: 20.00    Pack years: 5.00    Types: Cigarettes    Quit date: 07/17/1999    Years  since quitting: 20.3  . Smokeless tobacco: Never Used  Substance Use Topics  . Alcohol use: Not Currently    Comment: "drank some in my 30's"  . Drug use: No   Additional social history:  Please also refer to relevant sections of EMR.  Family History: Family History  Problem Relation Age of Onset  . Osteoarthritis Mother   . Asthma Mother   . Heart failure Mother   . Breast cancer Daughter     Allergies and Medications: Allergies  Allergen Reactions  . Caffeine Other (See Comments)    Migraine  . Hydralazine Hcl Other (See Comments)    Other reaction(s): Other (See Comments) AKI leading to rhabdo and electrolyte abnormalities  . Hydrocodone Nausea And Vomiting    Headache  . Ciprofloxacin Hives and Rash  . Erythromycin Hives and Rash  . Lisinopril Cough  . Oxycodone Nausea And Vomiting    Headache  . Sulfamethoxazole-Trimethoprim Rash   No current facility-administered medications on file prior to  encounter.   Current Outpatient Medications on File Prior to Encounter  Medication Sig Dispense Refill  . acetaminophen (TYLENOL) 325 MG tablet Take 2 tablets (650 mg total) by mouth every 6 (six) hours as needed for mild pain.    Marland Kitchen apixaban (ELIQUIS) 5 MG TABS tablet Take 1 tablet (5 mg total) by mouth 2 (two) times daily. 60 tablet 0  . atorvastatin (LIPITOR) 40 MG tablet TAKE 1 TABLET(40 MG) BY MOUTH DAILY AT 6 PM (Patient taking differently: Take 40 mg by mouth daily. ) 90 tablet 0  . azelastine (ASTELIN) 0.1 % nasal spray Place 2 sprays into both nostrils 2 (two) times daily. Use in each nostril as directed (Patient taking differently: Place 2 sprays into both nostrils daily. ) 30 mL 0  . azelastine (OPTIVAR) 0.05 % ophthalmic solution Place 1 drop into both eyes 2 (two) times daily as needed (itchy eyes). 6 mL 1  . cetirizine (ZYRTEC) 10 MG tablet Take 1 tablet (10 mg total) by mouth daily. 30 tablet 0  . cyclobenzaprine (FLEXERIL) 5 MG tablet Take 1 tablet (5 mg total) by mouth 2 (two) times daily as needed for muscle spasms. 30 tablet 0  . cycloSPORINE (RESTASIS) 0.05 % ophthalmic emulsion Place 1 drop into both eyes 2 (two) times daily.    . diclofenac sodium (VOLTAREN) 1 % GEL APPLY 2 GRAMS EXTERNALLY TO THE AFFECTED AREA FOUR TIMES DAILY (Patient taking differently: Apply 2 g topically 4 (four) times daily as needed (arthritis pain - knees, shoulder and thighs). ) 100 g 0  . diltiazem (CARDIZEM CD) 120 MG 24 hr capsule Take 1 capsule (120 mg total) by mouth daily. Please make overdue appt with Dr. Lovena Le before anymore refills. 1st attempt 30 capsule 0  . famotidine (PEPCID) 20 MG tablet Take 1 tablet (20 mg total) by mouth daily. 90 tablet 0  . FEROSUL 325 (65 Fe) MG tablet TAKE 1 TABLET(325 MG) BY MOUTH DAILY WITH BREAKFAST (Patient taking differently: Take 325 mg by mouth daily with breakfast. ) 30 tablet 0  . flecainide (TAMBOCOR) 100 MG tablet TAKE 1 TABLET BY MOUTH 2 TIMES A DAY.  PLEASE KEEP UPCOMING APPOINTMENT IN JANUARY BEFORE ANYMORE REFILLS. (Patient taking differently: TAKE 1 TABLET BY MOUTH 2 TIMES A DAY. PLEASE KEEP UPCOMING APPOINTMENT IN JANUARY BEFORE ANYMORE REFILLS.) 60 tablet 6  . fluticasone (FLONASE) 50 MCG/ACT nasal spray Place 2 sprays into both nostrils daily.     . furosemide (LASIX) 40 MG tablet  Take 1 tablet (40 mg total) by mouth daily. 90 tablet 0  . gabapentin (NEURONTIN) 100 MG capsule Take 2 capsules (200 mg total) by mouth 3 (three) times daily. 90 capsule 0  . hydrOXYzine (VISTARIL) 25 MG capsule TAKE 1 CAPSULE(25 MG) BY MOUTH TWICE DAILY AS NEEDED FOR ANXIETY 30 capsule 0  . ipratropium-albuterol (DUONEB) 0.5-2.5 (3) MG/3ML SOLN Take 3 mLs by nebulization every 6 (six) hours as needed (shortness of breath, wheezing). 360 mL 0  . Melatonin 3 MG TABS Take 1 tablet (3 mg total) by mouth at bedtime as needed (sleep). (Patient taking differently: Take 3 mg by mouth at bedtime. ) 30 tablet 0  . metoprolol tartrate (LOPRESSOR) 25 MG tablet TAKE 1/2 TABLET(12.5 MG) BY MOUTH TWICE DAILY (Patient taking differently: Take 12.5 mg by mouth 2 (two) times daily. ) 60 tablet 0  . mometasone-formoterol (DULERA) 100-5 MCG/ACT AERO Inhale 2 puffs into the lungs 2 (two) times daily. 13 g 0  . montelukast (SINGULAIR) 10 MG tablet Take 1 tablet (10 mg total) by mouth at bedtime. 30 tablet 0  . omeprazole (PRILOSEC) 20 MG capsule Take 1 capsule (20 mg total) by mouth daily. 90 capsule 0  . OXYGEN Inhale 7 L into the lungs continuous.     . traMADol (ULTRAM) 50 MG tablet Take 50 mg by mouth 2 (two) times daily as needed for moderate pain.     Marland Kitchen umeclidinium bromide (INCRUSE ELLIPTA) 62.5 MCG/INH AEPB Inhale 1 puff into the lungs daily. 90 each 0  . venlafaxine (EFFEXOR) 37.5 MG tablet Take 1 tablet (37.5 mg total) by mouth daily with breakfast. Take with a 150 mg ER capsule every morning 90 tablet 0  . venlafaxine XR (EFFEXOR-XR) 150 MG 24 hr capsule Take 1 capsule (150  mg total) by mouth daily with breakfast. Take 150mg  capsule with 37.5mg  tablet (Patient taking differently: Take 150 mg by mouth daily with breakfast. Take with a 37.5 mg tablet every morning)    . VENTOLIN HFA 108 (90 Base) MCG/ACT inhaler INHALE 2 PUFFS INTO THE LUNGS EVERY 6 HOURS AS NEEDED FOR WHEEZING OR SHORTNESS OF BREATH 18 g 0    Objective: BP 134/87   Pulse 93   Temp 98.1 F (36.7 C) (Oral)   Resp (!) 21   Wt (!) 154 kg   SpO2 95%   BMI 56.50 kg/m  Exam: General: very pleasant, obese AA woman resting comfortably in bed, NAD Eyes: anicteric sclerae, PERRLA ENTM: NCAT, MMM, clear oropharynx. Neck: supple Cardiovascular: RRR, no m/r/g, trace edema to mid shin Respiratory: crackles at bilateral bases, good airflow diffusely, tachypneic, no accessory muscle use Gastrointestinal: soft, NT, ND, +BS MSK: warm, dry Derm: no rashes or lesions Neuro: no focal deficits Psych: normal mood, full affect Skin: pt has bandage on right buttock.  Skin underneath does not show any blister/abscess. No noticeable swelling but the skin is very tender to palpation.   Labs and Imaging: CBC BMET  Recent Labs  Lab 11/24/19 0649 11/24/19 0649 11/24/19 0745  WBC 8.1  --   --   HGB 11.0*   < > 11.9*  HCT 37.4   < > 35.0*  PLT 382  --   --    < > = values in this interval not displayed.   Recent Labs  Lab 11/24/19 0649 11/24/19 0649 11/24/19 0745  NA 142   < > 145  K 3.1*   < > 3.0*  CL 102  --   --  CO2 31  --   --   BUN 10  --   --   CREATININE 0.49  --   --   GLUCOSE 95  --   --   CALCIUM 8.1*  --   --    < > = values in this interval not displayed.     EKG: EKG Interpretation  Date/Time:  Sunday Nov 24 2019 06:49:05 EDT Ventricular Rate:  85 PR Interval:    QRS Duration: 110 QT Interval:  395 QTC Calculation: 453 R Axis:   73 Text Interpretation: Sinus rhythm Short PR interval baseline wander in  leads  I, II, III Confirmed by Quintella Reichert [phone]) on 11/24/2019  7:24:47 AM  DG Chest Portable 1 View  Result Date: 11/24/2019 CLINICAL DATA:  Shortness of breath, history of CHF EXAM: PORTABLE CHEST 1 VIEW COMPARISON:  11/11/2019 FINDINGS: Persistent low lung volumes with elevation of the right hemidiaphragm. Probable bibasilar atelectasis. Stable mild interstitial prominence. Stable mild cardiomegaly. IMPRESSION: Similar bibasilar atelectasis elevation of the right hemidiaphragm. Stable mild interstitial prominence, which could reflect edema. Electronically Signed   By: Macy Mis M.D.   On: 11/24/2019 07:17   Gladys Damme, MD 11/24/2019, 10:15 AM PGY-1, [place] Intern pager: [phone], text pages welcome  Resident Addendum I have separately seen and examined the patient.  I have discussed the findings and exam with the resident and agree with the above note.  I helped develop the management plan that is described in the resident's note and I agree with the content.  Changes have been made in BLUE.    Addison Naegeli, MD PGY-2 [facility] residency program

## 2019-11-24 NOTE — ED Triage Notes (Signed)
Pt in w/sob, gradual onset last night since 9pm. Hx of asthma, COPD, CHF, afib. Recently discharged for similar on 5/19. Wears 5-7LNC at home, and she has recently been increasing her activity, thinks she went w/o her O2 for a few hrs yesterday. Has chest tightness and leg swelling since last night. Arrives sats 100% on 15L NRB

## 2019-11-24 NOTE — Progress Notes (Signed)
ABG results given to Quintella Reichert, MD.

## 2019-11-24 NOTE — ED Provider Notes (Signed)
[facility] EMERGENCY DEPARTMENT Provider Note   CSN: [id number] Arrival date & time: 11/24/19  [id number]     History No chief complaint on file.   [name] White is a 63 y.o. female.  HPI HPI Comments: [name] White is a 64 y.o. female with a medical history as noted below who presents to the Emergency Department via EMS complaining of SOB.  Per EMS, patient has a history of CHF, COPD, A. fib. She has had multiple admissions in the last 6 months. Per records, her last echocardiogram was in December of last year.  Her left ventricular ejection fraction was 60 to 65%.  She does have severe LVH.  Patient presents on nonrebreather at 15 L.  She is saturating at 100%.  Per patient, she became short of breath around 9 PM last night.  Her symptoms have continued to worsen.  She is typically on 6 L via nasal cannula at home, which she uses as needed, typically at night.  She notes increased orthopnea.  She additionally notes swelling in the bilateral lower extremities with pain in the prior mentioned regions.  She reports some associated chest tightness as well as a mild diffuse headache.  She is compliant with her medications.  She is on 40 mg of Lasix daily.  She denies fevers, chills, recent illnesses, syncope.  Per records, patient has a sleep study scheduled for June 7.  Past Medical History:  Diagnosis Date  . Abdominal wall hematoma 03/06/2019  . Acute bronchitis 08/29/2018  . Acute on chronic respiratory failure with hypoxia ([place]) 08/29/2018  . Anxiety   . Asthma   . Chronic lower back pain   . COPD (chronic obstructive pulmonary disease) ([place])   . Depression   . Exposure to COVID-19 virus 11/02/2018  . Fall 02/2019  . Family history of anesthesia complication    "daughter; causes her to pass out afterwards"  . GERD (gastroesophageal reflux disease)   . History of atrial flutter 06/26/2015  . History of pulmonary embolus (PE) 11/03/2014  . Hypertension   .  Hyperthyroidism   . Left medial tibial stress syndrome 12/26/2017  . Lower GI bleed   . Migraine    "monthly" (12/28/2013)  . Non-traumatic rhabdomyolysis 11/03/2014  . Obesity hypoventilation syndrome ([place]) 03/06/2019  . Osteoarthritis    "both knees; back of my neck; right pelvic bone" (12/28/2013)  . Paroxysmal A-fib ([place])   . Pulmonary embolism ([place]) 12/28/2013   "2 clots in each lung"    Patient Active Problem List   Diagnosis Date Noted  . Respiratory failure, acute-on-chronic ([place]) 11/09/2019  . Seasonal allergies 10/03/2019  . Debility 08/02/2019  . Hypertension associated with diabetes ([place])   . Weight gain due to medication 06/05/2019  . Acute on chronic heart failure with preserved ejection fraction ([place]) 03/14/2019  . Acquired thrombophilia ([place]) due to A-Fib 03/07/2019  . Heart failure with preserved ejection fraction ([place]) 03/06/2019  . Obesity hypoventilation syndrome ([place]) 03/06/2019  . Lower GI bleed   . Urge incontinence 01/27/2019  . Shortness of breath 08/29/2018  . OSA (obstructive sleep apnea) 07/05/2018  . Respiratory failure ([place]) 07/05/2018  . Severe episode of recurrent major depressive disorder, without psychotic features ([place])   . MDD (major depressive disorder), recurrent episode, severe ([place]) 09/07/2015  . Atrial flutter ([place]) 07/29/2015  . Asthma 11/12/2014  . History of hypertension 11/05/2014  . Paroxysmal atrial fibrillation ([place]) 11/03/2014  . Chronic anticoagulation 11/03/2014  . Major depressive disorder  11/03/2014  . History of pulmonary embolus (PE) 11/03/2014  . MDD (major depressive disorder), recurrent severe, without psychosis ([place]) 03/05/2014  . Hot flashes 04/22/2011  . OVERACTIVE BLADDER 04/18/2008  . Osteoarthritis of both knees 04/18/2008  . Osteoarthritis involving multiple joints on both sides of body 09/14/2007  . Morbid obesity ([place]) 08/24/2006  . RESTLESS LEGS SYNDROME 08/24/2006  . HYPERTENSION, BENIGN SYSTEMIC 08/24/2006  .  RHINITIS, ALLERGIC 08/24/2006  . GASTROESOPHAGEAL REFLUX, NO ESOPHAGITIS 08/24/2006    Past Surgical History:  Procedure Laterality Date  . ABDOMINAL HYSTERECTOMY    . APPENDECTOMY    . BREAST CYST EXCISION Right   . DILATION AND CURETTAGE OF UTERUS    . ELECTROPHYSIOLOGIC STUDY N/A 05/27/2015   Procedure: A-Flutter;  Surgeon: Evans Lance, MD;  Location: [place] [facility];  Service: Cardiovascular;  Laterality: N/A;  . EXCISIONAL HEMORRHOIDECTOMY    . NASAL SINUS SURGERY  2007  . THYROIDECTOMY, PARTIAL Right 2005  . TUBAL LIGATION    . WISDOM TOOTH EXTRACTION       OB History   No obstetric history on file.     Family History  Problem Relation Age of Onset  . Osteoarthritis Mother   . Asthma Mother   . Heart failure Mother   . Breast cancer Daughter     Social History   Tobacco Use  . Smoking status: Former Smoker    Packs/day: 0.25    Years: 20.00    Pack years: 5.00    Types: Cigarettes    Quit date: 07/17/1999    Years since quitting: 20.3  . Smokeless tobacco: Never Used  Substance Use Topics  . Alcohol use: Not Currently    Comment: "drank some in my 30's"  . Drug use: No    Home Medications Prior to Admission medications   Medication Sig Start Date End Date Taking? Authorizing Provider  acetaminophen (TYLENOL) 325 MG tablet Take 2 tablets (650 mg total) by mouth every 6 (six) hours as needed for mild pain. 09/11/18   Georgette Shell, MD  apixaban (ELIQUIS) 5 MG TABS tablet Take 1 tablet (5 mg total) by mouth 2 (two) times daily. 11/19/19   [name], MD  atorvastatin (LIPITOR) 40 MG tablet TAKE 1 TABLET(40 MG) BY MOUTH DAILY AT 6 PM Patient taking differently: Take 40 mg by mouth daily.  11/08/19   [name], MD  azelastine (ASTELIN) 0.1 % nasal spray Place 2 sprays into both nostrils 2 (two) times daily. Use in each nostril as directed Patient taking differently: Place 2 sprays into both nostrils daily.  06/01/19   Lattie Haw, MD    azelastine (OPTIVAR) 0.05 % ophthalmic solution Place 1 drop into both eyes 2 (two) times daily as needed (itchy eyes). 11/20/19   [name], MD  cetirizine (ZYRTEC) 10 MG tablet Take 1 tablet (10 mg total) by mouth daily. 11/20/19   [name], MD  cyclobenzaprine (FLEXERIL) 5 MG tablet Take 1 tablet (5 mg total) by mouth 2 (two) times daily as needed for muscle spasms. 11/22/19   [name], MD  cycloSPORINE (RESTASIS) 0.05 % ophthalmic emulsion Place 1 drop into both eyes 2 (two) times daily.    [provider]  diclofenac sodium (VOLTAREN) 1 % GEL APPLY 2 GRAMS EXTERNALLY TO THE AFFECTED AREA FOUR TIMES DAILY Patient taking differently: Apply 2 g topically 4 (four) times daily as needed (arthritis pain - knees, shoulder and thighs).  07/31/18   [name], MD  diltiazem (  CARDIZEM CD) 120 MG 24 hr capsule Take 1 capsule (120 mg total) by mouth daily. Please make overdue appt with Dr. Lovena Le before anymore refills. 1st attempt 11/20/19   Evans Lance, MD  famotidine (PEPCID) 20 MG tablet Take 1 tablet (20 mg total) by mouth daily. 06/05/19   Anderson, Chelsey L, DO  FEROSUL 325 (65 Fe) MG tablet TAKE 1 TABLET(325 MG) BY MOUTH DAILY WITH BREAKFAST Patient taking differently: Take 325 mg by mouth daily with breakfast.  10/02/19   [name], MD  flecainide (TAMBOCOR) 100 MG tablet TAKE 1 TABLET BY MOUTH 2 TIMES A DAY. PLEASE KEEP UPCOMING APPOINTMENT IN JANUARY BEFORE ANYMORE REFILLS. Patient taking differently: TAKE 1 TABLET BY MOUTH 2 TIMES A DAY. PLEASE KEEP UPCOMING APPOINTMENT IN JANUARY BEFORE ANYMORE REFILLS. 12/03/18   Patsey Berthold, NP  fluticasone (FLONASE) 50 MCG/ACT nasal spray Place 2 sprays into both nostrils daily.  10/29/18   [provider]  furosemide (LASIX) 40 MG tablet Take 1 tablet (40 mg total) by mouth daily. 10/02/19   [name], MD  gabapentin (NEURONTIN) 100 MG capsule Take 2 capsules (200 mg total) by mouth 3 (three) times daily.  11/22/19   [name], MD  hydrOXYzine (VISTARIL) 25 MG capsule TAKE 1 CAPSULE(25 MG) BY MOUTH TWICE DAILY AS NEEDED FOR ANXIETY 11/19/19   [name], MD  ipratropium-albuterol (DUONEB) 0.5-2.5 (3) MG/3ML SOLN Take 3 mLs by nebulization every 6 (six) hours as needed (shortness of breath, wheezing). 11/13/19   Wilber Oliphant, MD  Melatonin 3 MG TABS Take 1 tablet (3 mg total) by mouth at bedtime as needed (sleep). Patient taking differently: Take 3 mg by mouth at bedtime.  07/07/19   Brimage, Ronnette Juniper, DO  metoprolol tartrate (LOPRESSOR) 25 MG tablet TAKE 1/2 TABLET(12.5 MG) BY MOUTH TWICE DAILY Patient taking differently: Take 12.5 mg by mouth 2 (two) times daily.  09/25/19   [name], MD  mometasone-formoterol (DULERA) 100-5 MCG/ACT AERO Inhale 2 puffs into the lungs 2 (two) times daily. 06/05/19   Anderson, Chelsey L, DO  montelukast (SINGULAIR) 10 MG tablet Take 1 tablet (10 mg total) by mouth at bedtime. 08/12/19   [name], MD  omeprazole (PRILOSEC) 20 MG capsule Take 1 capsule (20 mg total) by mouth daily. 10/03/19   [name], MD  OXYGEN Inhale 7 L into the lungs continuous.     [provider]  traMADol (ULTRAM) 50 MG tablet Take 50 mg by mouth 2 (two) times daily as needed for moderate pain.  09/24/19   [provider]  umeclidinium bromide (INCRUSE ELLIPTA) 62.5 MCG/INH AEPB Inhale 1 puff into the lungs daily. 09/25/19   [name], MD  venlafaxine [facility]) 37.5 MG tablet Take 1 tablet (37.5 mg total) by mouth daily with breakfast. Take with a 150 mg ER capsule every morning 11/22/19   [name], MD  venlafaxine XR (EFFEXOR-XR) 150 MG 24 hr capsule Take 1 capsule (150 mg total) by mouth daily with breakfast. Take 150mg  capsule with 37.5mg  tablet Patient taking differently: Take 150 mg by mouth daily with breakfast. Take with a 37.5 mg tablet every morning 06/01/19   Lattie Haw, MD  VENTOLIN HFA 108 (90 Base) MCG/ACT inhaler INHALE 2 PUFFS INTO  THE LUNGS EVERY 6 HOURS AS NEEDED FOR WHEEZING OR SHORTNESS OF BREATH 11/22/19   [name], MD    Allergies    Caffeine, Hydralazine hcl, Hydrocodone, Ciprofloxacin, Erythromycin, Lisinopril, Oxycodone, and Sulfamethoxazole-trimethoprim  Review of Systems  Review of Systems  All other systems reviewed and are negative. Ten systems reviewed and are negative for acute change, except as noted in the HPI.    Physical Exam Updated Vital Signs There were no vitals taken for this visit.  Physical Exam Vitals and nursing note reviewed.  Constitutional:      General: She is in acute distress.     Appearance: Normal appearance. She is obese. She is not ill-appearing, toxic-appearing or diaphoretic.  HENT:     Head: Normocephalic and atraumatic.     Right Ear: External ear normal.     Left Ear: External ear normal.     Nose: Nose normal.     Mouth/Throat:     Pharynx: Oropharynx is clear.  Eyes:     Extraocular Movements: Extraocular movements intact.  Cardiovascular:     Rate and Rhythm: Normal rate and regular rhythm.     Pulses: Normal pulses.     Heart sounds: Normal heart sounds. No murmur. No friction rub. No gallop.   Pulmonary:     Effort: Respiratory distress present.     Breath sounds: No stridor. Rales present. No wheezing or rhonchi.     Comments: Rales noted in the right lower lung base. Difficult to assess secondary to body habitus.  Abdominal:     General: Abdomen is flat.     Tenderness: There is no abdominal tenderness.  Musculoskeletal:        General: Normal range of motion.     Cervical back: Normal range of motion and neck supple. No tenderness.     Right lower leg: Edema present.     Left lower leg: Edema present.     Comments: Pt complains of edema in the BLEs. Difficult to assess secondary to body habitus.   Skin:    General: Skin is warm and dry.  Neurological:     General: No focal deficit present.     Mental Status: She is alert and oriented to  person, place, and time.  Psychiatric:        Mood and Affect: Mood normal.        Behavior: Behavior normal.    ED Results / Procedures / Treatments   Labs (all labs ordered are listed, but only abnormal results are displayed) Labs Reviewed - No data to display  EKG EKG Interpretation  Date/Time:  Sunday Nov 24 2019 06:49:05 EDT Ventricular Rate:  85 PR Interval:    QRS Duration: 110 QT Interval:  395 QTC Calculation: 453 R Axis:   73 Text Interpretation: Sinus rhythm Short PR interval baseline wander in  leads  I, II, III Confirmed by Quintella Reichert [phone]) on 11/24/2019 7:24:47 AM   Radiology DG Chest Portable 1 View  Result Date: 11/24/2019 CLINICAL DATA:  Shortness of breath, history of CHF EXAM: PORTABLE CHEST 1 VIEW COMPARISON:  11/11/2019 FINDINGS: Persistent low lung volumes with elevation of the right hemidiaphragm. Probable bibasilar atelectasis. Stable mild interstitial prominence. Stable mild cardiomegaly. IMPRESSION: Similar bibasilar atelectasis elevation of the right hemidiaphragm. Stable mild interstitial prominence, which could reflect edema. Electronically Signed   By: Macy Mis M.D.   On: 11/24/2019 07:17   Procedures .Critical Care Performed by: Rayna Sexton, PA-C Authorized by: Rayna Sexton, PA-C   Critical care provider statement:    Critical care time (minutes):  45   Critical care was necessary to treat or prevent imminent or life-threatening deterioration of the following conditions:  Respiratory failure   Critical care was  time spent personally by me on the following activities:  Examination of patient, development of treatment plan with patient or surrogate, ordering and performing treatments and interventions, ordering and review of radiographic studies, ordering and review of laboratory studies and obtaining history from patient or surrogate    Medications Ordered in ED Medications - No data to display  ED Course  I have reviewed  the triage vital signs and the nursing notes.  Pertinent labs & imaging results that were available during my care of the patient were reviewed by me and considered in my medical decision making (see chart for details).    MDM Rules/Calculators/A&P                      Patient is a 64 year old female with a history of atrial fibrillation, COPD, heart failure.  She presented via EMS in respiratory distress.  She was tachypneic with saturations at 100% on 15 L via nonrebreather.  Respiratory has gotten involved with the patient and she is now on 10 L via high flow nasal cannula.  She is saturating around 99%.  Chest x-ray shows some bibasilar atelectasis elevation of the right hemidiaphragm as well as stable mild interstitial prominence which could reflect edema.  She is on 40 mg of Lasix p.o. daily.  I gave her 40 mg IV today.  She reports some mild alleviation of her chest tightness.  Her vital signs are stable at this time.  Her ECG shows sinus rhythm with short PR interval baseline wander in leads I, 2, 3.  Initial labs resulted showing hemoglobin decreased at 11.  Hypokalemia 3.1.  She was given 40 mEq of [place]-Con.  I-STAT 7 showing PCO2 of 68.3.  PO2 115.  Bicarbonate 37.7.  T CO2 40.  Acid base excess of 10.  Her BNP is not elevated.  It is 86.7 today.  Negative troponin.  Due to her medical history as well as general state of health will discuss with the internal medicine team for possible admission.  Final Clinical Impression(s) / ED Diagnoses Final diagnoses:  Respiratory distress   Rx / DC Orders ED Discharge Orders    None       Rayna Sexton, PA-C 11/24/19 1008    Quintella Reichert, MD 11/25/19 [phone]

## 2019-11-25 ENCOUNTER — Observation Stay (HOSPITAL_BASED_OUTPATIENT_CLINIC_OR_DEPARTMENT_OTHER): Payer: Medicare HMO

## 2019-11-25 ENCOUNTER — Observation Stay (HOSPITAL_COMMUNITY): Payer: Medicare HMO

## 2019-11-25 DIAGNOSIS — R0602 Shortness of breath: Secondary | ICD-10-CM | POA: Diagnosis not present

## 2019-11-25 DIAGNOSIS — R5381 Other malaise: Secondary | ICD-10-CM | POA: Diagnosis not present

## 2019-11-25 DIAGNOSIS — I361 Nonrheumatic tricuspid (valve) insufficiency: Secondary | ICD-10-CM

## 2019-11-25 DIAGNOSIS — Z7401 Bed confinement status: Secondary | ICD-10-CM | POA: Diagnosis not present

## 2019-11-25 DIAGNOSIS — F29 Unspecified psychosis not due to a substance or known physiological condition: Secondary | ICD-10-CM | POA: Diagnosis not present

## 2019-11-25 DIAGNOSIS — J9621 Acute and chronic respiratory failure with hypoxia: Secondary | ICD-10-CM | POA: Diagnosis not present

## 2019-11-25 DIAGNOSIS — J9622 Acute and chronic respiratory failure with hypercapnia: Secondary | ICD-10-CM | POA: Diagnosis not present

## 2019-11-25 DIAGNOSIS — M2550 Pain in unspecified joint: Secondary | ICD-10-CM | POA: Diagnosis not present

## 2019-11-25 DIAGNOSIS — R0603 Acute respiratory distress: Secondary | ICD-10-CM | POA: Diagnosis not present

## 2019-11-25 LAB — BASIC METABOLIC PANEL
Anion gap: 10 (ref 5–15)
BUN: 7 mg/dL — ABNORMAL LOW (ref 8–23)
CO2: 33 mmol/L — ABNORMAL HIGH (ref 22–32)
Calcium: 8.3 mg/dL — ABNORMAL LOW (ref 8.9–10.3)
Chloride: 99 mmol/L (ref 98–111)
Creatinine, Ser: 0.56 mg/dL (ref 0.44–1.00)
GFR calc Af Amer: >60 mL/min (ref >60)
GFR calc non Af Amer: >60 mL/min (ref >60)
Glucose, Bld: 130 mg/dL — ABNORMAL HIGH (ref 70–99)
Potassium: 3.1 mmol/L — ABNORMAL LOW (ref 3.5–5.1)
Sodium: 142 mmol/L (ref 135–145)

## 2019-11-25 LAB — HEMOGLOBIN A1C
Hgb A1c MFr Bld: 6.3 % — ABNORMAL HIGH (ref 4.8–5.6)
Mean Plasma Glucose: 134.11 mg/dL

## 2019-11-25 LAB — MAGNESIUM: Magnesium: 1.5 mg/dL — ABNORMAL LOW (ref 1.7–2.4)

## 2019-11-25 LAB — ECHOCARDIOGRAM COMPLETE: Weight: 5597.92 oz

## 2019-11-25 LAB — CBC
HCT: 38.1 % (ref 36.0–46.0)
Hemoglobin: 11.1 g/dL — ABNORMAL LOW (ref 12.0–15.0)
MCH: 28.4 pg (ref 26.0–34.0)
MCHC: 29.1 g/dL — ABNORMAL LOW (ref 30.0–36.0)
MCV: 97.4 fL (ref 80.0–100.0)
Platelets: 418 10*3/uL — ABNORMAL HIGH (ref 150–400)
RBC: 3.91 MIL/uL (ref 3.87–5.11)
RDW: 20.6 % — ABNORMAL HIGH (ref 11.5–15.5)
WBC: 9.4 10*3/uL (ref 4.0–10.5)
nRBC: 0 % (ref 0.0–0.2)

## 2019-11-25 MED ORDER — POTASSIUM CHLORIDE CRYS ER 20 MEQ PO TBCR: 40.0000 meq | EXTENDED_RELEASE_TABLET | ORAL | Status: DC

## 2019-11-25 MED ORDER — POTASSIUM CHLORIDE CRYS ER 20 MEQ PO TBCR
40.0000 meq | EXTENDED_RELEASE_TABLET | ORAL | Status: AC
  Administered 2019-11-25 (×2): 40 meq via ORAL
  Filled 2019-11-25 (×2): qty 2

## 2019-11-25 MED ORDER — FUROSEMIDE 40 MG PO TABS
60.0000 mg | ORAL_TABLET | Freq: Every day | ORAL | 0 refills | Status: DC
Start: 2019-11-25 — End: 2019-11-26

## 2019-11-25 MED ORDER — POTASSIUM CHLORIDE ER 10 MEQ PO TBCR: 20.0000 meq | EXTENDED_RELEASE_TABLET | Freq: Every day | ORAL | 0 refills | Status: DC

## 2019-11-25 MED ORDER — MAGNESIUM SULFATE 2 GM/50ML IV SOLN
2.0000 g | Freq: Once | INTRAVENOUS | Status: AC
  Administered 2019-11-25: 2 g via INTRAVENOUS
  Filled 2019-11-25: qty 50

## 2019-11-25 MED ORDER — FUROSEMIDE 40 MG PO TABS
60.0000 mg | ORAL_TABLET | Freq: Once | ORAL | Status: AC
  Administered 2019-11-25: 60 mg via ORAL
  Filled 2019-11-25: qty 1

## 2019-11-25 MED ORDER — POTASSIUM CHLORIDE 20 MEQ PO PACK
40.0000 meq | PACK | Freq: Two times a day (BID) | ORAL | Status: DC
  Filled 2019-11-25: qty 2

## 2019-11-25 MED ORDER — POTASSIUM CHLORIDE CRYS ER 20 MEQ PO TBCR: 40.0000 meq | EXTENDED_RELEASE_TABLET | Freq: Two times a day (BID) | ORAL | Status: DC

## 2019-11-25 NOTE — Progress Notes (Signed)
  Echocardiogram 2D Echocardiogram has been performed.  [name] White 11/25/2019, 12:31 PM

## 2019-11-25 NOTE — Progress Notes (Signed)
RT called to give Prn treatment. RT asked if RN could give due to RT being in trauma for [redacted] week pregnant patient. RT will check on patient when RT finished with trauma. RT will continue to monitor.

## 2019-11-25 NOTE — TOC Initial Note (Addendum)
Transition of Care [facility]) - Initial/Assessment Note    Patient Details  Name: [name] White MRN: [id number] Date of Birth: August 28, 1955  Transition of Care [facility]) CM/SW Contact:    Marilu Favre, RN Phone Number: 11/25/2019, 1:03 PM  Clinical Narrative:                 Patient from home with niece , already has DME and HHPT with [facility] Tommi Rumps with [facility] aware possible [place].  Patient will need PTAR home at discharge. Confirmed face sheet information. Will place PTAR paperwork in drawer. Bedside to call if needed. Patient has key and states her niece is at home.   Patient has home oxygen already.  Expected Discharge Plan: [place] Barriers to Discharge: Continued Medical Work up(Echo results)   Patient Goals and CMS Choice Patient states their goals for this hospitalization and ongoing recovery are:: to return to home CMS Medicare.gov Compare Post Acute Care list provided to:: Patient Choice offered to / list presented to : Patient  Expected Discharge Plan and Services Expected Discharge Plan: [place]   Discharge Planning Services: CM Consult Post Acute Care Choice: [place] arrangements for the past 2 months: Single Family Home                 DME Arranged: N/A DME Agency: NA       HH Arranged: PT HH Agency: [place] Date [facility] Agency Contacted: 11/25/19 Time HH Agency Contacted: 1302 Representative spoke with at [place]: cory  Prior Living Arrangements/Services Living arrangements for the past 2 months: [place] with:: Relatives(niece) Patient language and need for interpreter reviewed:: Yes Do you feel safe going back to the place where you live?: Yes      Need for Family Participation in Patient Care: Yes (Comment) Care giver support system in place?: Yes (comment) Current home services: DME Criminal Activity/Legal Involvement Pertinent to Current Situation/Hospitalization:  No - Comment as needed  Activities of Daily Living   ADL Screening (condition at time of admission) Patient's cognitive ability adequate to safely complete daily activities?: Yes Is the patient deaf or have difficulty hearing?: No Does the patient have difficulty seeing, even when wearing glasses/contacts?: No Does the patient have difficulty concentrating, remembering, or making decisions?: No Patient able to express need for assistance with ADLs?: Yes Does the patient have difficulty dressing or bathing?: Yes Independently performs ADLs?: No Communication: Independent Dressing (OT): Needs assistance Is this a change from baseline?: Pre-admission baseline Grooming: Needs assistance Is this a change from baseline?: Pre-admission baseline Feeding: Independent Bathing: Needs assistance Is this a change from baseline?: Pre-admission baseline Toileting: Needs assistance Is this a change from baseline?: Pre-admission baseline In/Out Bed: Needs assistance Is this a change from baseline?: Pre-admission baseline Walks in Home: Needs assistance Is this a change from baseline?: Pre-admission baseline Does the patient have difficulty walking or climbing stairs?: Yes Weakness of Legs: Both Weakness of Arms/Hands: None  Permission Sought/Granted   Permission granted to share information with : Yes, Verbal Permission Granted     Permission granted to share info w AGENCY: [facility]        Emotional Assessment   Attitude/Demeanor/Rapport: Engaged Affect (typically observed): Accepting Orientation: : Oriented to Self, Oriented to Place, Oriented to  Time, Oriented to Situation Alcohol / Substance Use: Not Applicable Psych Involvement: No (comment)  Admission diagnosis:  Respiratory distress [R06.03] SOB (shortness of breath) [R06.02]  Patient Active Problem List   Diagnosis Date Noted  . SOB (shortness of breath) 11/24/2019  . Respiratory distress   . Respiratory failure,  acute-on-chronic ([place]) 11/09/2019  . Seasonal allergies 10/03/2019  . Debility 08/02/2019  . Hypertension associated with diabetes ([place])   . Weight gain due to medication 06/05/2019  . Acute on chronic heart failure with preserved ejection fraction ([place]) 03/14/2019  . Acquired thrombophilia ([place]) due to A-Fib 03/07/2019  . Heart failure with preserved ejection fraction ([place]) 03/06/2019  . Obesity hypoventilation syndrome ([place]) 03/06/2019  . Lower GI bleed   . Urge incontinence 01/27/2019  . Shortness of breath 08/29/2018  . OSA (obstructive sleep apnea) 07/05/2018  . Respiratory failure ([place]) 07/05/2018  . Severe episode of recurrent major depressive disorder, without psychotic features ([place])   . MDD (major depressive disorder), recurrent episode, severe ([place]) 09/07/2015  . Atrial flutter ([place]) 07/29/2015  . Asthma 11/12/2014  . History of hypertension 11/05/2014  . Paroxysmal atrial fibrillation ([place]) 11/03/2014  . Chronic anticoagulation 11/03/2014  . Major depressive disorder 11/03/2014  . History of pulmonary embolus (PE) 11/03/2014  . MDD (major depressive disorder), recurrent severe, without psychosis ([place]) 03/05/2014  . Hot flashes 04/22/2011  . OVERACTIVE BLADDER 04/18/2008  . Osteoarthritis of both knees 04/18/2008  . Osteoarthritis involving multiple joints on both sides of body 09/14/2007  . Morbid obesity ([place]) 08/24/2006  . RESTLESS LEGS SYNDROME 08/24/2006  . HYPERTENSION, BENIGN SYSTEMIC 08/24/2006  . RHINITIS, ALLERGIC 08/24/2006  . GASTROESOPHAGEAL REFLUX, NO ESOPHAGITIS 08/24/2006   PCP:  [name], MD Pharmacy:   [facility] [place] [address] Phone: [phone] Fax: [phone]  [facility] [place] [place] [address] [place] [address] Phone: [phone] Fax:  [phone]     Social Determinants of Health (SDOH) Interventions    Readmission Risk Interventions Readmission Risk Prevention Plan 07/22/2019 03/08/2019  Transportation Screening Complete Complete  Medication Review (RN Care Manager) Complete Complete  PCP or Specialist appointment within 3-5 days of discharge Complete Complete  HRI or [place] Complete Complete  SW Recovery Care/Counseling Consult Complete Complete  Palliative Care Screening Not Applicable Not [place] Not Applicable Not Applicable  Some recent data might be hidden

## 2019-11-25 NOTE — Evaluation (Signed)
Physical Therapy Evaluation Patient Details Name: CARIA WAPLE MRN: [id number] DOB: [birth year] Today's Date: 11/25/2019   History of Present Illness  Pt is a 64 y.o. female presenting with acute on chronic respiratory failure, hypokalemia, and hematuria.Marland Kitchen PMH is significant for HTN, overactive bladder, OA of knees, Afib (Eliquis), MDD, OSA, HFpEF & DM.  Clinical Impression  Pt admitted with above diagnosis. Pt presenting with near baseline mobility but with goals to increase ambulation by her birthday in [birth year].  Pt very motivated and reports doing HEP on her own and independence with short distance ambulation (3-4') at home.  Pt was working with [facility] prior to admission.  Today she was able to transfer and ambulate short distance with supervision to min guard level with min cues.  Pt's O2 sats were stable on 6 LPM and HR remained < 100 bpm.  She did have slight drop in BP with standing and reporting mild lightheadedness that resolved with time.  Pt with good rehab potential. Pt currently with functional limitations due to the deficits listed below (see PT Problem List). Pt will benefit from skilled PT to increase their independence and safety with mobility to allow discharge to the venue listed below.       Follow Up Recommendations Home health PT;Supervision for mobility/OOB    Equipment Recommendations  Other (comment)(has dme)    Recommendations for Other Services       Precautions / Restrictions Precautions Precautions: Fall;Other (comment) Precaution Comments: monitor O2 sats and HR and BP (slight drop with transfers)      Mobility  Bed Mobility Overal bed mobility: Needs Assistance Bed Mobility: Supine to Sit;Sit to Supine     Supine to sit: Supervision;HOB elevated Sit to supine: Supervision;HOB elevated      Transfers Overall transfer level: Needs assistance Equipment used: Rolling walker (2 wheeled) Transfers: Sit to/from Stand Sit to Stand: Min guard          General transfer comment: cues for safe hand placement  Ambulation/Gait Ambulation/Gait assistance: Min guard Gait Distance (Feet): 4 Feet Assistive device: Rolling walker (2 wheeled) Gait Pattern/deviations: Step-to pattern;Decreased stride length;Shuffle Gait velocity: decreased   General Gait Details: Pt took 4 steps forward, 4 back, and a couple sideways at EOB; fatigued easily; needs bed close or chair follow; cued for posture and technique with backward and sidestep  Stairs            Wheelchair Mobility    Modified Rankin (Stroke Patients Only)       Balance Overall balance assessment: Needs assistance Sitting-balance support: Feet supported;No upper extremity supported Sitting balance-Leahy Scale: Good     Standing balance support: Bilateral upper extremity supported Standing balance-Leahy Scale: Fair Standing balance comment: used RW                             Pertinent Vitals/Pain Pain Assessment: No/denies pain    Home Living Family/patient expects to be discharged to:: Private residence Living Arrangements: Other relatives(niece) Available Help at Discharge: Family;Available PRN/intermittently;Other (Comment)(niece who works during day; [facility] aide comes M,W,Th [phone];) Type of Home: Apartment Home Access: Stairs to enter Entrance Stairs-Rails: None Entrance Stairs-Number of Steps: 1 Home Layout: One level Home Equipment: [place] - 2 wheels;Walker - 4 wheels;Cane - single point;Wheelchair - [employer];Tub bench;Hospital bed      Prior Function Level of Independence: Needs assistance   Gait / Transfers Assistance Needed: Pt only able to take a couple steps  on her own by holding onto items in rom  ADL's / Homemaking Assistance Needed: niece fixes her food/does IADL tasks; helps her bath and get dressed; does toielting on her own  Comments: was active with home therapy, has a goal of walking for her birthday in september; on 6  LPM O2 at home     Hand Dominance        Extremity/Trunk Assessment   Upper Extremity Assessment Upper Extremity Assessment: Overall WFL for tasks assessed    Lower Extremity Assessment Lower Extremity Assessment: RLE deficits/detail;LLE deficits/detail RLE Deficits / Details: ROM WFL; MMT 4/5 LLE Deficits / Details: ROM WFL; MMT 4/5       Communication   Communication: No difficulties  Cognition Arousal/Alertness: Awake/alert Behavior During Therapy: WFL for tasks assessed/performed Overall Cognitive Status: Within Functional Limits for tasks assessed                                        General Comments General comments (skin integrity, edema, etc.): Pt on 6 LPM O2 with O2 sats 95% or >.  HR 70's rest and up to 98 with walking.  BP sitting 128/81 and standing 109/96    Exercises     Assessment/Plan    PT Assessment Patient needs continued PT services  PT Problem List Decreased strength;Decreased activity tolerance;Decreased balance;Decreased mobility;Decreased knowledge of use of DME;Cardiopulmonary status limiting activity;Obesity       PT Treatment Interventions DME instruction;Gait training;Functional mobility training;Therapeutic activities;Therapeutic exercise;Balance training;Patient/family education;Wheelchair mobility training    PT Goals (Current goals can be found in the Care Plan section)  Acute Rehab PT Goals Patient Stated Goal: to be able to walk again by her birthday in September PT Goal Formulation: With patient Time For Goal Achievement: 12/09/19 Potential to Achieve Goals: Good    Frequency Min 3X/week   Barriers to discharge        Co-evaluation               AM-PAC PT "6 Clicks" Mobility  Outcome Measure Help needed turning from your back to your side while in a flat bed without using bedrails?: A Little Help needed moving from lying on your back to sitting on the side of a flat bed without using bedrails?: A  Little Help needed moving to and from a bed to a chair (including a wheelchair)?: None Help needed standing up from a chair using your arms (e.g., wheelchair or bedside chair)?: None Help needed to walk in hospital room?: A Little Help needed climbing 3-5 steps with a railing? : Total 6 Click Score: 18    End of Session Equipment Utilized During Treatment: Oxygen Activity Tolerance: Patient limited by fatigue Patient left: in bed;with call bell/phone within reach(sitting EOB) Nurse Communication: Mobility status PT Visit Diagnosis: Muscle weakness (generalized) (M62.81);Difficulty in walking, not elsewhere classified (R26.2)    Time: [id number] PT Time Calculation (min) (ACUTE ONLY): 36 min   Charges:   PT Evaluation $PT Eval Moderate Complexity: 1 Mod PT Treatments $Therapeutic Activity: 8-22 mins        Maggie Font, PT Acute Rehab Services Pager [phone] [facility] Re[facility] [phone] [facility] [place] 11/25/2019, 10:47 AM

## 2019-11-25 NOTE — Discharge Summary (Signed)
[facility] Discharge Summary  Patient name: [name] White Medical record number: [id number] Date of birth: 04-20-1956 Age: 64 y.o. Gender: female Date of Admission: 11/24/2019  Date of Discharge: 11/25/2019 Admitting Physician: Gladys Damme, MD  Primary Care Provider: [name], MD Consultants: none  Indication for Hospitalization: SOB, volume overload  Discharge Diagnoses/Problem List:  Acute on Chronic Respiratory Failure Asthma OSA HFpEF H/O PE on anticoagulation Hypertension A fib (Eliquis) Overactive bladder MDD DM  Disposition: to home   Discharge Condition: stable  Discharge Exam:  Vitals:   11/25/19 0822 11/25/19 1530  BP:  (!) 118/59  Pulse:  76  Resp:  16  Temp:  98.2 F (36.8 C)  SpO2: 95% 97%  Physical Exam: General: older, obese AA woman Cardiovascular: RRR, no m/r/g Respiratory: CTAB, no w/r/r, no increased WOB Abdomen: soft, NT, ND Extremities: warm, dry, trace edema at b/l ankles  Brief Hospital Course:  SOB  Acute on Chronic Respiratory Failure HFpEF Patient presented to ED with increased SOB from baseline worsening despite home O2, trilogy machine, and adhering to medications including lasix 40 mg qd. See H&P for full admission details. PCO2 found to be 68, pH was WNL at 7.35, indicating compensated chronic hypercarbia. Patient had 2.5L UOP with lasix 80 mg IV and had returned to her baseline overnight. Improved breathing, back on home oxygen requirement, only trace LE edema. Patient had been on lasix 40 mg PO daily, will increase to lasix 60 mg PO daily, started today. Echo obtained 5/31 which reveals LVEF 60-65% with severe LVH, unable to assess diastolic function, enlarged atria bilaterally. Recommend patient follow up with pulmonology to follow possible OHS, as well as with cardiology for heart failure.  Hypokalemia Patient had K of 3 on admission, continued today at 3.1, repleted with klor-con.  Recommend patient take klor con 20 mEq daily. Please check BMP at hospital f/u.  Cyst on R buttock Patient has pain at buttock site, given mupirocin. Ensure resolution.  Issues for Follow Up:  1. Please refer patient to o/p cardiology for HF  2. Assess for LE edema to continue lasix 60 mg PO daily or not. 3. Check for hypokalemia on BMP 4. Ensure patient follows up with pulmonology for hypercarbia and possible OHS diagonsis 5. Ensure resolution of right buttock cyst 6. Consider referring patient to medical weight management clinic for OHS and morbid obesity  Significant Procedures: none  Significant Labs and Imaging:  Recent Labs  Lab 11/24/19 0649 11/24/19 0745 11/25/19 0243  WBC 8.1  --  9.4  HGB 11.0* 11.9* 11.1*  HCT 37.4 35.0* 38.1  PLT 382  --  418*   Recent Labs  Lab 11/24/19 0649 11/24/19 0649 11/24/19 0745 11/25/19 0243  NA 142  --  145 142  K 3.1*   < > 3.0* 3.1*  CL 102  --   --  99  CO2 31  --   --  33*  GLUCOSE 95  --   --  130*  BUN 10  --   --  7*  CREATININE 0.49  --   --  0.56  CALCIUM 8.1*  --   --  8.3*  MG  --   --   --  1.5*  ALKPHOS 85  --   --   --   AST 22  --   --   --   ALT 18  --   --   --   ALBUMIN 3.1*  --   --   --    < > =  values in this interval not displayed.   Left ventricular ejection fraction, by estimation, is 60 to 65%. The left ventricle has normal function. Left ventricular endocardial border not optimally defined to evaluate regional wall motion. There is severe left ventricular hypertrophy. Left ventricular diastolic parameters are indeterminate. 2. Right ventricular systolic function is normal. The right ventricular size is moderately enlarged. 3. Left atrial size was mildly dilated. 4. The mitral valve is normal in structure. Trivial mitral valve regurgitation. No evidence of mitral stenosis. 5. The aortic valve is grossly normal. Aortic valve regurgitation is not visualized. No aortic stenosis is  present.  Results/Tests Pending at Time of Discharge: none  Discharge Medications:  Allergies as of 11/25/2019      Reactions   Caffeine Other (See Comments)   Migraine   Hydralazine Hcl Other (See Comments)   Other reaction(s): Other (See Comments) AKI leading to rhabdo and electrolyte abnormalities   Hydrocodone Nausea And Vomiting   Headache   Ciprofloxacin Hives, Rash   Erythromycin Hives, Rash   Lisinopril Cough   Oxycodone Nausea And Vomiting   Headache   Sulfamethoxazole-trimethoprim Rash      Medication List    TAKE these medications   acetaminophen 325 MG tablet Commonly known as: TYLENOL Take 2 tablets (650 mg total) by mouth every 6 (six) hours as needed for mild pain.   apixaban 5 MG Tabs tablet Commonly known as: ELIQUIS Take 1 tablet (5 mg total) by mouth 2 (two) times daily.   atorvastatin 40 MG tablet Commonly known as: LIPITOR TAKE 1 TABLET(40 MG) BY MOUTH DAILY AT 6 PM What changed: See the new instructions.   azelastine 0.05 % ophthalmic solution Commonly known as: OPTIVAR Place 1 drop into both eyes 2 (two) times daily as needed (itchy eyes).   azelastine 0.1 % nasal spray Commonly known as: ASTELIN Place 2 sprays into both nostrils 2 (two) times daily. Use in each nostril as directed What changed:   when to take this  additional instructions   cetirizine 10 MG tablet Commonly known as: ZYRTEC Take 1 tablet (10 mg total) by mouth daily.   cyclobenzaprine 5 MG tablet Commonly known as: FLEXERIL Take 1 tablet (5 mg total) by mouth 2 (two) times daily as needed for muscle spasms.   cycloSPORINE 0.05 % ophthalmic emulsion Commonly known as: RESTASIS Place 1 drop into both eyes 2 (two) times daily as needed (dry eyes).   diclofenac sodium 1 % Gel Commonly known as: VOLTAREN APPLY 2 GRAMS EXTERNALLY TO THE AFFECTED AREA FOUR TIMES DAILY What changed: See the new instructions.   diltiazem 120 MG 24 hr capsule Commonly known as: CARDIZEM  CD Take 1 capsule (120 mg total) by mouth daily. Please make overdue appt with Dr. Lovena Le before anymore refills. 1st attempt   famotidine 20 MG tablet Commonly known as: PEPCID Take 1 tablet (20 mg total) by mouth daily.   FeroSul 325 (65 FE) MG tablet Generic drug: ferrous sulfate TAKE 1 TABLET(325 MG) BY MOUTH DAILY WITH BREAKFAST   flecainide 100 MG tablet Commonly known as: TAMBOCOR TAKE 1 TABLET BY MOUTH 2 TIMES A DAY. PLEASE KEEP UPCOMING APPOINTMENT IN JANUARY BEFORE ANYMORE REFILLS.   fluticasone 50 MCG/ACT nasal spray Commonly known as: FLONASE Place 2 sprays into both nostrils daily.   furosemide 40 MG tablet Commonly known as: Lasix Take 1.5 tablets (60 mg total) by mouth daily. What changed: how much to take   gabapentin 100 MG capsule Commonly known as: NEURONTIN  Take 2 capsules (200 mg total) by mouth 3 (three) times daily.   hydrOXYzine 25 MG capsule Commonly known as: VISTARIL TAKE 1 CAPSULE(25 MG) BY MOUTH TWICE DAILY AS NEEDED FOR ANXIETY What changed:   how much to take  how to take this  when to take this  reasons to take this   Incruse Ellipta 62.5 MCG/INH Aepb Generic drug: umeclidinium bromide Inhale 1 puff into the lungs daily.   ipratropium-albuterol 0.5-2.5 (3) MG/3ML Soln Commonly known as: DUONEB Take 3 mLs by nebulization every 6 (six) hours as needed (shortness of breath, wheezing).   melatonin 3 MG Tabs tablet Take 1 tablet (3 mg total) by mouth at bedtime as needed (sleep). What changed: when to take this   metoprolol tartrate 25 MG tablet Commonly known as: LOPRESSOR TAKE 1/2 TABLET(12.5 MG) BY MOUTH TWICE DAILY What changed:   how much to take  how to take this  when to take this  additional instructions   mometasone-formoterol 100-5 MCG/ACT Aero Commonly known as: DULERA Inhale 2 puffs into the lungs 2 (two) times daily.   montelukast 10 MG tablet Commonly known as: SINGULAIR Take 1 tablet (10 mg total) by  mouth at bedtime.   omeprazole 20 MG capsule Commonly known as: PRILOSEC Take 1 capsule (20 mg total) by mouth daily.   OXYGEN Inhale 6 L into the lungs continuous.   potassium chloride 10 MEQ tablet Commonly known as: KLOR-CON Take 2 tablets (20 mEq total) by mouth daily.   traMADol 50 MG tablet Commonly known as: ULTRAM Take 50 mg by mouth 2 (two) times daily as needed for moderate pain.   venlafaxine XR 150 MG 24 hr capsule Commonly known as: EFFEXOR-XR Take 1 capsule (150 mg total) by mouth daily with breakfast. Take 150mg  capsule with 37.5mg  tablet What changed: additional instructions   venlafaxine 37.5 MG tablet Commonly known as: EFFEXOR Take 1 tablet (37.5 mg total) by mouth daily with breakfast. Take with a 150 mg ER capsule every morning What changed: Another medication with the same name was changed. Make sure you understand how and when to take each.   Ventolin HFA 108 (90 Base) MCG/ACT inhaler Generic drug: albuterol INHALE 2 PUFFS INTO THE LUNGS EVERY 6 HOURS AS NEEDED FOR WHEEZING OR SHORTNESS OF BREATH What changed:   how much to take  how to take this  when to take this  reasons to take this       Discharge Instructions: Please refer to Patient Instructions section of EMR for full details.  Patient was counseled important signs and symptoms that should prompt return to medical care, changes in medications, dietary instructions, activity restrictions, and follow up appointments.   Follow-Up Appointments: Follow-up Information    [name], MD. Go on 11/27/2019.   Specialty: Family Medicine Why: Go to appt aat 3:15 PM Contact information: [address] [place] [address] [phone]           Gladys Damme, MD 11/25/2019, 5:34 PM PGY-1, [place]

## 2019-11-25 NOTE — Discharge Instructions (Signed)
You were treated for fluid overload:  - please continue to wear your bipap at night - please take lasix 60 mg PO for leg swelling - please take potassium chloride 20 mEq daily to prevent low potassium - please follow up with your pulmonologist on June 7th - please follow up with Dr. Kris Mouton on June 2nd

## 2019-11-25 NOTE — Progress Notes (Signed)
  Echocardiogram 2D Echocardiogram has been attempted. Patient with PT. Nurse asked to reattempt at later time.  [name] White 11/25/2019, 9:58 AM

## 2019-11-25 NOTE — Progress Notes (Signed)
Family Medicine Teaching Service Daily Progress Note Intern Pager: [phone]  Patient name: [name] White Medical record number: [id number] Date of birth: 06-08-1956 Age: 64 y.o. Gender: female  Primary Care Provider: [name], MD Consultants: none Code Status: Full  Pt Overview and Major Events to Date:  11/14/19 admitted, IV lasix  Assessment and Plan: [name] White a 64 y.o.femalepresenting with acute on chronic respiratory failure. PMH is significant forHTN, overactive bladder, OA of knees, Afib (Eliquis), MDD, OSA, HFpEF &DM.   SOB  Acute on Chronic Respiratory Failure HFpEF Overnight patient satting well on home O2 regimen, received CPAP overnight. Would prefer patient to do BiPAP due to chronic CO2 retention seen on ABG yesterday. Patient had appropriate diuresis on IV lasix 40 mg: 2.5L UOP. Wt increased to 158.7kg from 154 kg. However, I suspect today's weight (on a different scale) is likely more accurate as patient had an appropriate UOP. No LE edema today, no JVP. Patient previously on lasix 40 mg PO qd, clearly not enough diuresis. Will try lasix 60 mg PO today. F/U echo. -Strict I/Os -Daily weight -F/U echo -Continuous pulse oximetry -Continue DuoNebs, Dulera, Incruse Ellipta, Singulair -Continue BiPAP qhs -AM CBC, BMP -Vital signs per floor protocol -Up with assistance -PT/OT eval and treat  Hypokalemia  Hypomagnesemia Patient being treated with Lasix 40 mg, potassium 3 yesterday, improved to 3.1 today after 40 mEq klor con. Will continue to replete with klor-con 73mEq BID. Mg low at 1.5 today, will repleted with 2g IV. -Klor Con 40 mEq BID -Mg 2g IV - AM BMP, Mg  Hematuria UA today with large hemoglobin on urine dipstick, subsequent microscopy 6-10 RBCs per HPF.  Is postmenopausal, does not report any bleeding, and denies any dysuria. -Follow-up outpatient  History of PE Patient has h/o PE w/ chronic swelling of LLE.  Home medications  include Eliquis, will continue in hospital. -Continue home Eliquis  Prediabetes Hemoglobin A1c 7.8% in December 2020. Hgb A1c 6.3% today. No DM medications listed on home meds, managed with diet. Glucose 130 on BMP this AM. -Monitor glucose via BMP for now  Paroxysmal atrial fibrillation Medications include diltiazem 120 mg nightly, metoprolol 12.5 mg twice daily and flecainide 100 mg daily as well as Eliquis 5 mg BID.  EKG on admission shows normal sinus rhythm.   -We will monitor heart rate with vital signs -Continue home medications  HTN Home medications include metoprolol, Coreg, Lasix 40 mg p.o. daily.  Patient is currently normotensive: Most recently 110/71. -Continue to monitor -Continue home meds  OSA/OHS Patient has trilogy BiPAP at home.  She reports that she was using it regularly and had steadily worsening symptoms despite this.Consider referral to medical weight management. -Patient to see pulmonologist on June 7  Hyperlipidemia Home medication is Lipitor 40 mg daily -Continue home meds  Morbid obesity Current weight is 154 kg, BMI 56.5.  Patient states she would like to lose weight. Wt increased today to 158.7kg, but different scale, likely correct weight as pt had 2.5L UOP yesterday. Consider referral to medical weight management. -Follow-up with PCP -Consider referral to medical weight management  MDD/anxiety Has history of anxiety and depression.  Home medications include Atarax and Effexor.  Patient reports being in the normal mood today. -Continue home medications  GERD Patient takes Prilosec 20 mg and Pepcid 20 mg at home -Continue home meds  Subcutaneous cyst  Pt has recurrent cyst on right buttock she believes is related to her urinary incontinence.  On exam was  not able to visually appreciate cyst but pt was very tender to touch.  Has received mupirocin topical treatment in past admissions.   - mupirocin 2% BID  FEN/GI: HH diet, 1538mL  fluid restriction Prophylaxis: eliquis  Disposition: TBD, possibly home  Subjective:  Patient reports that she feels much better than yesterday, she can lay flat with bipap.  Objective: Temp:  [98.1 F (36.7 C)-98.7 F (37.1 C)] 98.2 F (36.8 C) (05/31 0542) Pulse Rate:  [67-93] 67 (05/31 0542) Resp:  [18-29] 20 (05/31 0542) BP: (110-146)/(64-93) 110/71 (05/31 0542) SpO2:  [92 %-100 %] 92 % (05/31 0542) Weight:  [158.7 kg] 158.7 kg (05/31 0542) Physical Exam: General: older, obese AA woman Cardiovascular: RRR, no m/r/g Respiratory: CTAB, no w/r/r, no increased WOB Abdomen: soft, NT, ND Extremities: warm, dry, trace edema at b/l ankles  Laboratory: Recent Labs  Lab 11/24/19 0649 11/24/19 0745 11/25/19 0243  WBC 8.1  --  9.4  HGB 11.0* 11.9* 11.1*  HCT 37.4 35.0* 38.1  PLT 382  --  418*   Recent Labs  Lab 11/24/19 0649 11/24/19 0745 11/25/19 0243  NA 142 145 142  K 3.1* 3.0* 3.1*  CL 102  --  99  CO2 31  --  33*  BUN 10  --  7*  CREATININE 0.49  --  0.56  CALCIUM 8.1*  --  8.3*  PROT 7.2  --   --   BILITOT 0.5  --   --   ALKPHOS 85  --   --   ALT 18  --   --   AST 22  --   --   GLUCOSE 95  --  130*   Imaging/Diagnostic Tests: DG Chest Portable 1 View  Result Date: 11/24/2019 CLINICAL DATA:  Shortness of breath, history of CHF EXAM: PORTABLE CHEST 1 VIEW COMPARISON:  11/11/2019 FINDINGS: Persistent low lung volumes with elevation of the right hemidiaphragm. Probable bibasilar atelectasis. Stable mild interstitial prominence. Stable mild cardiomegaly. IMPRESSION: Similar bibasilar atelectasis elevation of the right hemidiaphragm. Stable mild interstitial prominence, which could reflect edema. Electronically Signed   By: Macy Mis M.D.   On: 11/24/2019 07:17   Gladys Damme, MD 11/25/2019, 7:49 AM PGY-1, [place] Intern pager: [phone], text pages welcome

## 2019-11-26 ENCOUNTER — Other Ambulatory Visit: Payer: Self-pay | Admitting: Family Medicine

## 2019-11-27 ENCOUNTER — Other Ambulatory Visit: Payer: Self-pay

## 2019-11-27 ENCOUNTER — Telehealth (INDEPENDENT_AMBULATORY_CARE_PROVIDER_SITE_OTHER): Payer: Medicare HMO | Admitting: Family Medicine

## 2019-11-27 NOTE — Progress Notes (Signed)
Called patient and received no answer x2. Left vm. Will no show and no charge.  [name] MD PGY-3 Family Medicine Resident

## 2019-11-28 DIAGNOSIS — J449 Chronic obstructive pulmonary disease, unspecified: Secondary | ICD-10-CM | POA: Diagnosis not present

## 2019-11-29 ENCOUNTER — Telehealth (INDEPENDENT_AMBULATORY_CARE_PROVIDER_SITE_OTHER): Payer: Medicare HMO | Admitting: Family Medicine

## 2019-11-29 ENCOUNTER — Encounter: Payer: Self-pay | Admitting: Family Medicine

## 2019-11-29 ENCOUNTER — Other Ambulatory Visit: Payer: Self-pay

## 2019-11-29 VITALS — Ht 63.0 in | Wt 349.9 lb

## 2019-11-29 DIAGNOSIS — F332 Major depressive disorder, recurrent severe without psychotic features: Secondary | ICD-10-CM | POA: Diagnosis not present

## 2019-11-29 DIAGNOSIS — R29898 Other symptoms and signs involving the musculoskeletal system: Secondary | ICD-10-CM | POA: Diagnosis not present

## 2019-11-29 DIAGNOSIS — J9610 Chronic respiratory failure, unspecified whether with hypoxia or hypercapnia: Secondary | ICD-10-CM | POA: Diagnosis not present

## 2019-11-29 DIAGNOSIS — N3281 Overactive bladder: Secondary | ICD-10-CM | POA: Diagnosis not present

## 2019-11-29 MED ORDER — VENLAFAXINE HCL ER 150 MG PO CP24: 150.0000 mg | ORAL_CAPSULE | Freq: Every day | ORAL | Status: DC

## 2019-11-29 NOTE — Progress Notes (Signed)
[place] Telemedicine Visit  Patient consented to have virtual visit and was identified by name and date of birth. Method of visit: Telephone  Encounter participants: Patient: [name] White - located at home address  Provider: Stark Klein - located at [facility]  Others (if applicable): none   Chief Complaint: needs PT/OT and med refill   HPI: Request for [facility] PT Patient reports that she would like to restart physical therapy and occupational therapy as she is homebound. She has home health aide to assist with appointment on Monday with pulmonologist. Patient uses 6 liters oxygen at baseline and increases amount with exertion, usually up to 7 liters when she goes to appt. Trilogy is working very well. Patient has no respiratory complaints today.   Patient has had physical therapy before and wants it again to help with walking and to help build upper body strength. Was recommended for this during last admission. Discharge summary shows that therapy was set up but patient has not heard anything from [facility] agency about therapy sessions.    MDD  Patient is requesting medication refill. Reports she is doing well in regards to her mood. Denies SI/HI.  PHQ9 SCORE ONLY 11/29/2019 08/27/2019 01/17/2019  PHQ-9 Total Score 2 0 0    ROS: per HPI  Pertinent PMHx: Asthma, chronic respiratory failure on supplemental oxygen, morbid obesity   Exam:  Ht 5\' 3"  (1.6 m)   Wt (!) 349 lb 13.9 oz (158.7 kg) Comment: no weight since hospital  BMI 61.98 kg/m   Respiratory: speaking in complete sentences, no coughing, patient's voice mildly reduced in volume due to trilogy machine   Assessment/Plan:  Morbid obesity ([place]) Patient states that she has been home bound due to difficulty with ambulation. Patient requesting [place] [facility] PT to help with this.  -referral for [facility] PT    MDD (major depressive disorder), recurrent severe, without psychosis ([place]) Patient requests refill of  150 mg venlafaxine portion of her prescription. -Refill sent    Time spent during visit with patient: 14 minutes

## 2019-11-29 NOTE — Assessment & Plan Note (Signed)
Patient requests refill of 150 mg venlafaxine portion of her prescription. -Refill sent

## 2019-11-29 NOTE — Assessment & Plan Note (Addendum)
Patient states that she has been home bound due to difficulty with ambulation. Patient requesting [facility] PT to help with this.  -referral for [facility] PT

## 2019-12-02 ENCOUNTER — Other Ambulatory Visit: Payer: Self-pay

## 2019-12-02 ENCOUNTER — Encounter: Payer: Self-pay | Admitting: Pulmonary Disease

## 2019-12-02 ENCOUNTER — Ambulatory Visit (INDEPENDENT_AMBULATORY_CARE_PROVIDER_SITE_OTHER): Payer: Medicare HMO | Admitting: Pulmonary Disease

## 2019-12-02 DIAGNOSIS — E662 Morbid (severe) obesity with alveolar hypoventilation: Secondary | ICD-10-CM | POA: Diagnosis not present

## 2019-12-02 DIAGNOSIS — R5381 Other malaise: Secondary | ICD-10-CM

## 2019-12-02 DIAGNOSIS — G4733 Obstructive sleep apnea (adult) (pediatric): Secondary | ICD-10-CM

## 2019-12-02 DIAGNOSIS — N3281 Overactive bladder: Secondary | ICD-10-CM | POA: Diagnosis not present

## 2019-12-02 DIAGNOSIS — J9612 Chronic respiratory failure with hypercapnia: Secondary | ICD-10-CM

## 2019-12-02 DIAGNOSIS — I5033 Acute on chronic diastolic (congestive) heart failure: Secondary | ICD-10-CM | POA: Diagnosis not present

## 2019-12-02 NOTE — Assessment & Plan Note (Signed)
Plan:  Continue NIV at night  Follow up with RB in 6-8 weeks for follow up

## 2019-12-02 NOTE — Assessment & Plan Note (Signed)
Plan:  Pt to schedule follow up with Cardiology - Dr. Lovena Le, pt will call

## 2019-12-02 NOTE — Assessment & Plan Note (Signed)
Plan: Continue NIV ventilator at night

## 2019-12-02 NOTE — Progress Notes (Signed)
Virtual Visit via Telephone Note  I connected with [name] White on 12/02/19 at  2:30 PM EDT by telephone and verified that I am speaking with the correct person using two identifiers.  Location: Patient: Home Provider: [facility] - [address], [place], [place], [place] [address]   I discussed the limitations, risks, security and privacy concerns of performing an evaluation and management service by telephone and the availability of in person appointments. I also discussed with the patient that there may be a patient responsible charge related to this service. The patient expressed understanding and agreed to proceed.  Patient consented to consult via telephone: Yes People present and their role in pt care: Pt     History of Present Illness:  64 year old female former smoker followed in our office for obstructive sleep apnea, obesity hypoventilation syndrome, and chronic respiratory failure  Past medical history: Morbid obesity, hypertension, GERD, a flutter, hypertension, history of PE depression Smoking history: Former smoker.  Quit 2001.  5-pack-year smoking history. Maintenance: Incruse Ellipta Patient of Dr. Lamonte Sakai  Chief complaint:    64 year old female former smoker followed in office for obstructive sleep apnea, obesity hypoventilation syndrome and chronic respiratory failure.  Patient completing telephone visit today as a hospital follow-up.  Patient was hospitalized on 11/24/2019 and discharged on 11/25/2019.  She was hospitalized for a total of 37 hours.  A brief excerpt of her discharge summary is listed below   Brief Hospital Course:  SOB  Acute on Chronic Respiratory Failure HFpEF Patient presented to ED with increased SOB from baseline worsening despite home O2, trilogy machine, and adhering to medications including lasix 40 mg qd. See H&P for full admission details. PCO2 found to be 68, pH was WNL at 7.35, indicating compensated chronic  hypercarbia. Patient had 2.5L UOP with lasix 80 mg IV and had returned to her baseline overnight. Improved breathing, back on home oxygen requirement, only trace LE edema. Patient had been on lasix 40 mg PO daily, will increase to lasix 60 mg PO daily, started today. Echo obtained 5/31 which reveals LVEF 60-65% with severe LVH, unable to assess diastolic function, enlarged atria bilaterally. Recommend patient follow up with pulmonology to follow possible OHS, as well as with cardiology for heart failure.  Hypokalemia Patient had K of 3 on admission, continued today at 3.1, repleted with klor-con. Recommend patient take klor con 20 mEq daily. Please check BMP at hospital f/u.  Cyst on R buttock Patient has pain at buttock site, given mupirocin. Ensure resolution.  Issues for Follow Up:  1. Please refer patient to o/p cardiology for HF  2. Assess for LE edema to continue lasix 60 mg PO daily or not. 3. Check for hypokalemia on BMP 4. Ensure patient follows up with pulmonology for hypercarbia and possible OHS diagonsis 5. Ensure resolution of right buttock cyst 6. Consider referring patient to medical weight management clinic for OHS and morbid obesity  Patient reports that she is been doing okay since hospital discharge.  She has a history of frequent hospitalizations and emergency room visits.  She still reports that she is struggling with aspects of her shortness of breath.  She attributes this to her feeling like she is being more active.  She is trying to be more independent.  Patient was significantly restricted mobility.  She can move around her room.  She has not been able to walk outside of her room yet.  She struggles to walk from one side of  her room about 8 to 10 feet to a bedside commode.  She currently uses an NIV trilogy vent at home.  She uses this every night as well as sometimes during the day.  She never obtained a formal sleep study.  This was started for hypercarbia.  She is  working with home health physical therapy.  She is doing her daily exercises.  Walking still is very difficult for the patient.  She has not set up a follow-up appointment with cardiology as originally requested on the discharge paperwork.  We will discuss this today  Patient is currently monitoring the right buttock cyst.  She is using mupirocin.  She will follow up with primary care if anything worsens.  Patient is currently maintained on Incruse Ellipta.  She is wondering if she would benefit from trilogy Ellipta.  We will discuss this today.  Observations/Objective:  11/25/2019-echocardiogram-LV ejection fraction 60 to 65%, right ventricle systolic function is normal, right ventricular size is moderately enlarged, severe LVH  07/18/2018-pulmonary function test-FVC 3.22 (119% predicted), postbronchodilator ratio 85, postbronchodilator FEV1 2.61 (123% predicted), no bronchodilator response, DLCO 16.76 (65% predicted), ERV 0.06 (14% predicted)  Social History   Tobacco Use  Smoking Status Former Smoker  . Packs/day: 0.25  . Years: 20.00  . Pack years: 5.00  . Types: Cigarettes  . Quit date: 07/17/1999  . Years since quitting: 20.3  Smokeless Tobacco Never Used   Immunization History  Administered Date(s) Administered  . Influenza Split 08/18/2012  . Influenza Whole 04/18/2008  . Influenza,inj,Quad PF,6+ Mos 04/07/2013, 03/23/2015, 03/15/2019  . Influenza-Unspecified 05/08/2019  . Pneumococcal Polysaccharide-23 03/27/2005, 09/29/2014  . Td 01/25/1998, 04/18/2008      Assessment and Plan:  Discussion: Explained to patient currently right now her focus is improving her overall physical activity.  At some point likely need do need to consider outpatient sleep study.  I do not believe the patient can physically complete this at this time.  Continue NIV ventilator.  Patient to schedule appointment with cardiology Dr. Lovena Le.  I do not believe it is unreasonable for the patient be  maintained on Incruse Ellipta at this time but based off of her 2020 pulmonary function test I doubt that this is helping much from a management of her shortness of breath.  We will continue to clinically monitor this  Heart failure with preserved ejection fraction (HCC) Plan:  Pt to schedule follow up with Cardiology - Dr. Lovena Le, pt will call   Respiratory failure ([place]) Plan:  Continue NIV at night  Follow up with RB in 6-8 weeks for follow up   OSA (obstructive sleep apnea) Plan: Continue NIV We will need to consider outpatient sleep study once patient is physically able to complete  Obesity hypoventilation syndrome ([place]) Plan: Continue NIV ventilator at night  Debility Plan: Continue to work with home health physical therapy   Follow Up Instructions:  Return in about 2 months (around 02/01/2020), or if symptoms worsen or fail to improve, for Follow up with Dr. Lamonte Sakai.   I discussed the assessment and treatment plan with the patient. The patient was provided an opportunity to ask questions and all were answered. The patient agreed with the plan and demonstrated an understanding of the instructions.   The patient was advised to call back or seek an in-person evaluation if the symptoms worsen or if the condition fails to improve as anticipated.  I provided 23 minutes of non-face-to-face time during this encounter.   Lauraine Rinne, NP

## 2019-12-02 NOTE — Assessment & Plan Note (Signed)
Plan: Continue NIV We will need to consider outpatient sleep study once patient is physically able to complete

## 2019-12-02 NOTE — Assessment & Plan Note (Signed)
Plan: Continue to work with home health physical therapy

## 2019-12-02 NOTE — Patient Instructions (Addendum)
You were seen today by Lauraine Rinne, NP  for:   1. Suspected OSA (obstructive sleep apnea) 2. Chronic respiratory failure with hypercapnia (HCC) 3. Obesity hypoventilation syndrome (HCC)  Continue NIV  Continue oxygen therapy as prescribed  >>>maintain oxygen saturations greater than 88 percent  >>>if unable to maintain oxygen saturations please contact the office  >>>do not smoke with oxygen  >>>can use nasal saline gel or nasal saline rinses to moisturize nose if oxygen causes dryness  4. Acute on chronic heart failure with preserved ejection fraction [facility])  Schedule appointment with cardiology as discussed today  5. Debility  Continue to work with home health physical therapy  Follow Up:    Return in about 2 months (around 02/01/2020), or if symptoms worsen or fail to improve, for Follow up with Dr. Lamonte Sakai.   Please do your part to reduce the spread of COVID-19:      Reduce your risk of any infection  and COVID19 by using the similar precautions used for avoiding the common cold or flu:  Marland Kitchen Wash your hands often with soap and warm water for at least 20 seconds.  If soap and water are not readily available, use an alcohol-based hand sanitizer with at least 60% alcohol.  . If coughing or sneezing, cover your mouth and nose by coughing or sneezing into the elbow areas of your shirt or coat, into a tissue or into your sleeve (not your hands). Langley Gauss A MASK when in public  . Avoid shaking hands with others and consider head nods or verbal greetings only. . Avoid touching your eyes, nose, or mouth with unwashed hands.  . Avoid close contact with people who are sick. . Avoid places or events with large numbers of people in one location, like concerts or sporting events. . If you have some symptoms but not all symptoms, continue to monitor at home and seek medical attention if your symptoms worsen. . If you are having a medical emergency, call 911.   [place] / e-Visit: [email or web address]         [facility]: [facility]: [phone]                   [facility]: [phone]     It is flu season:   >>> Best ways to protect herself from the flu: Receive the yearly flu vaccine, practice good hand hygiene washing with soap and also using hand sanitizer when available, eat a nutritious meals, get adequate rest, hydrate appropriately   Please contact the office if your symptoms worsen or you have concerns that you are not improving.   Thank you for choosing [facility] for your healthcare, and for allowing [place] to partner with you on your healthcare journey. I am thankful to be able to provide care to you today.   Wyn Quaker FNP-C

## 2019-12-03 ENCOUNTER — Other Ambulatory Visit: Payer: Self-pay

## 2019-12-03 ENCOUNTER — Telehealth: Payer: Self-pay | Admitting: Emergency Medicine

## 2019-12-03 MED ORDER — CYCLOBENZAPRINE HCL 5 MG PO TABS: 5.0000 mg | ORAL_TABLET | Freq: Two times a day (BID) | ORAL | 0 refills | Status: DC | PRN

## 2019-12-03 NOTE — Chronic Care Management (AMB) (Signed)
  Care Management   Follow Up Note   12/03/2019 Name: KAMDEN REBER MRN: [id number] DOB: 04-28-1956  Referred by: [name], MD Reason for referral : Care Coordination (Care Management [facility] F/U )   EMIYA LOOMER is a 64 y.o. year old female who is a primary care patient of [name], MD. The care management team was consulted for assistance with care management and care coordination needs.    Review of patient status, including review of consultants reports, relevant laboratory and other test results, and collaboration with appropriate care team members and the patient's provider was performed as part of comprehensive patient evaluation and provision of chronic care management services.    SDOH (Social Determinants of Health) assessments performed: No See Care Plan activities for detailed interventions related to [facility])     Advanced Directives: See Care Plan and Vynca application for related entries.   Goals Addressed            This Visit's Progress   . I went to the hopital due  to maintainace fixing apartment upstairs and chemicals came through my vent (pt-stated)       [place] (see longitudinal plan of care for additional care plan information)  Current Barriers:  . Chronic Disease Management support and education needs related to post discharge instructions,for IP event on  11/09/19 including medication review , MD follow up appointments and Home Health-=patient states that she went in to the hospital due to maintenance in the building working on the apartment upstairs and used a chemical that came through the vent in her room and her trilogy machine and exacerbated her breathing issues.   Nurse Case Manager Clinical Goal(s):  Marland Kitchen Over the next 14 days, patient will verbalize understanding of plan for home health PT/OT services  Interventions:  . Evaluation of current treatment plan and patient's adherence to plan as established by  provider. . Reviewed medications with patient - patient states that she has her medications but will speak with her PCP about another medication for her allergies. . Discussed plans with patient for ongoing care management follow up and provided patient with direct contact information for care management team . Reviewed scheduled/upcoming provider appointments- appointment with Dr Kris Mouton 11-20-19 at 350 pm.  [facility] Pul[facility] Care 12-02-19 . Patient has been contacted by [facility] home health and PT will start tomorrow, still waiting to hear form OT,  she has her aid from [facility] there working with her.    Patient Self Care Activities:  . Patient verbalizes understanding of plan  . Self administers medications as prescribed . Attends all scheduled provider appointments . Calls pharmacy for medication refills . Calls provider office for new concerns or questions . Unable to independently self navigate   Initial goal documentation         RNCM will follow up with the patient within the next month of  June  Danyella Mcginty RN, BSN, [facility] Care Management Coordinator [place] Phone: [phone] Fax: [phone]

## 2019-12-03 NOTE — Telephone Encounter (Signed)
I did not see any note in regards to why someone from office had tried to call pt. Called pt to make sure she had been made aware of upcoming appt with RB in July and she stated she had been made aware of it. Stated to pt if someone from office needed to speak with her, they should call her back and she verbalized understanding. Nothing further needed.

## 2019-12-03 NOTE — Patient Instructions (Signed)
Visit Information  Goals Addressed            This Visit's Progress   . I went to the hopital due  to maintainace fixing apartment upstairs and chemicals came through my vent (pt-stated)       [place] (see longitudinal plan of care for additional care plan information)  Current Barriers:  . Chronic Disease Management support and education needs related to post discharge instructions,for IP event on  11/09/19 including medication review , MD follow up appointments and Home Health-=patient states that she went in to the hospital due to maintenance in the building working on the apartment upstairs and used a chemical that came through the vent in her room and her trilogy machine and exacerbated her breathing issues.   Nurse Case Manager Clinical Goal(s):  Marland Kitchen Over the next 14 days, patient will verbalize understanding of plan for home health PT/OT services  Interventions:  . Evaluation of current treatment plan and patient's adherence to plan as established by provider. . Reviewed medications with patient - patient states that she has her medications but will speak with her PCP about another medication for her allergies. . Discussed plans with patient for ongoing care management follow up and provided patient with direct contact information for care management team . Reviewed scheduled/upcoming provider appointments- appointment with Dr Kris Mouton 11-20-19 at 350 pm.  [facility] Pulmonary Care 12-02-19 . Patient has been contacted by [facility] home health and PT will start tomorrow, still waiting to hear form OT,  she has her aid from [facility] there working with her.    Patient Self Care Activities:  . Patient verbalizes understanding of plan  . Self administers medications as prescribed . Attends all scheduled provider appointments . Calls pharmacy for medication refills . Calls provider office for new concerns or questions . Unable to independently self navigate   Initial goal  documentation        [name] White was given information about Care Management services today including:  1. Care Management services include personalized support from designated clinical staff supervised by her physician, including individualized plan of care and coordination with other care providers 2. 24/7 contact phone numbers for assistance for urgent and routine care needs. 3. The patient may stop CCM services at any time (effective at the end of the month) by phone call to the office staff.  Patient agreed to services and verbal consent obtained.   The patient verbalized understanding of instructions provided today and declined a print copy of patient instruction materials.   RNCM will follow up with the patient within the next month of  June   [name] Flesch RN, BSN, [facility] Care Management Coordinator [place] Phone: [phone] Fax: [phone]

## 2019-12-04 ENCOUNTER — Other Ambulatory Visit: Payer: Self-pay

## 2019-12-04 ENCOUNTER — Ambulatory Visit: Payer: Medicare HMO

## 2019-12-04 NOTE — Chronic Care Management (AMB) (Signed)
Care Management   Follow Up Note   12/04/2019 Name: [name] White MRN: [id number] DOB: [birth year]  Referred by: [name], MD Reason for referral : Care Coordination (Care Management RNCM Follow up)   [name] White is a 64 y.o. year old female who is a primary care patient of [name], MD. The care management team was consulted for assistance with care management and care coordination needs.    Review of patient status, including review of consultants reports, relevant laboratory and other test results, and collaboration with appropriate care team members and the patient's provider was performed as part of comprehensive patient evaluation and provision of chronic care management services.    SDOH (Social Determinants of Health) assessments performed: No See Care Plan activities for detailed interventions related to [facility])     Advanced Directives: See Care Plan and Vynca application for related entries.   Goals Addressed            This Visit's Progress   . I went to the hopital due  to maintainace fixing apartment upstairs and chemicals came through my vent (pt-stated)       [place] (see longitudinal plan of care for additional care plan information)  Current Barriers:  . Chronic Disease Management support and education needs related to post discharge instructions,for IP event on  11/09/19 including medication review , MD follow up appointments and Home Health-=patient states that she went in to the hospital due to maintenance in the building working on the apartment upstairs and used a chemical that came through the vent in her room and her trilogy machine and exacerbated her breathing issues.   Nurse Case Manager Clinical Goal(s):  Marland Kitchen Over the next 14 days, patient will verbalize understanding of plan for home health PT/OT services  Interventions:  . Evaluation of current treatment plan and patient's adherence to plan as established by  provider. . Reviewed medications with patient - patient states that she has her medications but will speak with her PCP about another medication for her allergies. . Discussed plans with patient for ongoing care management follow up and provided patient with direct contact information for care management team . Reviewed scheduled/upcoming provider appointments- appointment with Dr Kris Mouton 11-20-19 at 350 pm.  [facility] Pulmonary Care 12-02-19 . Patient has been contacted by [facility] home health and PT will start tomorrow, still waiting to hear form OT,  she has her aid from [facility] there working with her.  . 12/04/19 . Patient states that she is feeling well . She had both of her appoinnments with Dr Rosita Fire and with Pulmonoligy both telemed. She said she  needs to have a cardiology appointment then they will decide if she can have an appointment face to face. . She stated that there has been a referral placed for PT and OT to help her ambulate. . She has all of her meds except for venlafaxine 150 mg.  She is waiting for the pharmacy to let her know when it is ready. . The patient asked me to check if there are any facilities that come to the home and give covid vaccines also she wants compression socks.  I advised the patient to talk with her pcp about BP monitor.      Patient Self Care Activities:  . Patient verbalizes understanding of plan  . Self administers medications as prescribed . Attends all scheduled provider appointments . Calls pharmacy for medication refills . Calls provider office for new concerns or  questions . Unable to independently self navigate   Initial goal documentation         The care management team will reach out to the patient again over the next 14 days.  The patient has been provided with contact information for the care management team and has been advised to call with any health related questions or concerns.   Lazaro Arms RN, BSN, [facility] Care Management  Coordinator [place] Phone: [phone] Fax: [phone]
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
## 2019-12-05 ENCOUNTER — Telehealth: Payer: Self-pay

## 2019-12-05 DIAGNOSIS — Z6841 Body Mass Index (BMI) 40.0 and over, adult: Secondary | ICD-10-CM | POA: Diagnosis not present

## 2019-12-05 DIAGNOSIS — J9622 Acute and chronic respiratory failure with hypercapnia: Secondary | ICD-10-CM | POA: Diagnosis not present

## 2019-12-05 DIAGNOSIS — I110 Hypertensive heart disease with heart failure: Secondary | ICD-10-CM | POA: Diagnosis not present

## 2019-12-05 DIAGNOSIS — J45909 Unspecified asthma, uncomplicated: Secondary | ICD-10-CM | POA: Diagnosis not present

## 2019-12-05 DIAGNOSIS — L0889 Other specified local infections of the skin and subcutaneous tissue: Secondary | ICD-10-CM | POA: Diagnosis not present

## 2019-12-05 DIAGNOSIS — G4733 Obstructive sleep apnea (adult) (pediatric): Secondary | ICD-10-CM | POA: Diagnosis not present

## 2019-12-05 DIAGNOSIS — I5033 Acute on chronic diastolic (congestive) heart failure: Secondary | ICD-10-CM | POA: Diagnosis not present

## 2019-12-05 DIAGNOSIS — E876 Hypokalemia: Secondary | ICD-10-CM | POA: Diagnosis not present

## 2019-12-05 NOTE — Telephone Encounter (Signed)
[name] White, [place] PT, calls nurse line requesting verbal orders for [facility] PT as follows:  1x a week for 1 week  2x a week for 2 weeks  1x a week for 2 weeks   You may call VO to her secure VM [phone].  Of note, [name] White mentions increased swelling in patients legs. Patient does have an apt scheduled with PCP already. Will suggest scheduling sooner or keeping current apt. I can inform [name] White of this when I call back for VO.

## 2019-12-06 ENCOUNTER — Ambulatory Visit: Payer: Self-pay

## 2019-12-06 ENCOUNTER — Other Ambulatory Visit: Payer: Self-pay | Admitting: *Deleted

## 2019-12-06 ENCOUNTER — Telehealth: Payer: Self-pay | Admitting: Physician Assistant

## 2019-12-06 NOTE — Telephone Encounter (Signed)
Called to discuss the homebound Covid-19 vaccination initiative with the patient and/or caregiver.   Message left to call back.  Anayansi Rundquist PA-C  [facility]     

## 2019-12-06 NOTE — Chronic Care Management (AMB) (Signed)
  Chronic Care Management   Note  12/06/2019 Name: [name] White MRN: [id number] DOB: [birth year]  RNCM called and left the patient message asking for a return call.  Need to explain the  Home bound vaccine program to the patient.     Follow up plan:  RNCM will follow back up with the patient in 3- 5 days   [place], BSN, [place] Phone: [phone] Fax: [phone]

## 2019-12-06 NOTE — Telephone Encounter (Signed)
[place] for verbal orders as listed. Leg swelling can be addressed at clinic follow up   [name] MD PGY-3 [facility] Medicine Resident

## 2019-12-08 DIAGNOSIS — J9611 Chronic respiratory failure with hypoxia: Secondary | ICD-10-CM | POA: Diagnosis not present

## 2019-12-08 MED ORDER — METOPROLOL TARTRATE 25 MG PO TABS: 12.5000 mg | ORAL_TABLET | Freq: Two times a day (BID) | ORAL | 0 refills | Status: DC

## 2019-12-09 ENCOUNTER — Telehealth: Payer: Self-pay | Admitting: Physician Assistant

## 2019-12-09 NOTE — Telephone Encounter (Signed)
I connected by phone with Telford Desanctis and/or patient's caregiver on 12/09/2019 at 12:20 PM to discuss the potential vaccination through our Homebound vaccination initiative.   Prevaccination Checklist for COVID-19 Vaccines  1.  Are you feeling sick today? no  2.  Have you ever received a dose of a COVID-19 vaccine?  no      If yes, which one? None   3.  Have you ever had an allergic reaction: (This would include a severe reaction [ e.g., anaphylaxis] that required treatment with epinephrine or EpiPen or that caused you to go to the hospital.  It would also include an allergic reaction that occurred within 4 hours that caused hives, swelling, or respiratory distress, including wheezing.) A.  A previous dose of COVID-19 vaccine. no  B.  A vaccine or injectable therapy that contains multiple components, one of which is a COVID-19 vaccine component, but it is not known which component elicited the immediate reaction. no  C.  Are you allergic to polyethylene glycol? no   4.  Have you ever had an allergic reaction to another vaccine (other than COVID-19 vaccine) or an injectable medication? (This would include a severe reaction [ e.g., anaphylaxis] that required treatment with epinephrine or EpiPen or that caused you to go to the hospital.  It would also include an allergic reaction that occurred within 4 hours that caused hives, swelling, or respiratory distress, including wheezing.)  no   5.  Have you ever had a severe allergic reaction (e.g., anaphylaxis) to something other than a component of the COVID-19 vaccine, or any vaccine or injectable medication?  This would include food, pet, venom, environmental, or oral medication allergies.  no   6.  Have you received any vaccine in the last 14 days? no   7.  Have you ever had a positive test for COVID-19 or has a doctor ever told you that you had COVID-19?  no   8.  Have you received passive antibody therapy (monoclonal antibodies or  convalescent serum) as a treatment for COVID-19? no   9.  Do you have a weakened immune system caused by something such as HIV infection or cancer or do you take immunosuppressive drugs or therapies?  no   10.  Do you have a bleeding disorder or are you taking a blood thinner? yes (Eliquis)  11.  Are you pregnant or breast-feeding? no   12.  Do you have dermal fillers? no   __________________   This patient is a 64 y.o. female that meets the FDA criteria to receive homebound vaccination. Patient or parent/caregiver understands they have the option to accept or refuse homebound vaccination.  Patient passed the pre-screening checklist and would like to proceed with homebound vaccination.  Based on questionnaire above, I recommend the patient be observed for 30 minutes.  There are no other household members/caregivers who are also interested in receiving the vaccine.     I will send the patient's information to our scheduling team who will reach out to schedule the patient and potential caregiver/family members for homebound vaccination.    Angelena Form 12/09/2019 12:20 PM

## 2019-12-11 ENCOUNTER — Other Ambulatory Visit: Payer: Self-pay

## 2019-12-11 ENCOUNTER — Ambulatory Visit: Payer: Medicare HMO

## 2019-12-11 NOTE — Chronic Care Management (AMB) (Signed)
  Care Management   Outreach Note  12/11/2019 Name: [name] White MRN: [id number] DOB: [birth year]  Referred by: [name], MD Reason for referral : Care Coordination (Care Management RNCM Vaccine)   An unsuccessful telephone outreach was attempted today. The patient was referred to the case management team for assistance with care management and care coordination.  Unable to leave a message no voicemail pick up.  Follow Up Plan: The care management team will reach out to the patient again over the next 5-7 days.   Lazaro Arms RN, BSN, [facility] Care Management Coordinator [place] Phone: [phone] Fax: [phone]

## 2019-12-12 DIAGNOSIS — J45909 Unspecified asthma, uncomplicated: Secondary | ICD-10-CM | POA: Diagnosis not present

## 2019-12-12 DIAGNOSIS — J9622 Acute and chronic respiratory failure with hypercapnia: Secondary | ICD-10-CM | POA: Diagnosis not present

## 2019-12-12 DIAGNOSIS — E876 Hypokalemia: Secondary | ICD-10-CM | POA: Diagnosis not present

## 2019-12-12 DIAGNOSIS — Z6841 Body Mass Index (BMI) 40.0 and over, adult: Secondary | ICD-10-CM | POA: Diagnosis not present

## 2019-12-12 DIAGNOSIS — G4733 Obstructive sleep apnea (adult) (pediatric): Secondary | ICD-10-CM | POA: Diagnosis not present

## 2019-12-12 DIAGNOSIS — I110 Hypertensive heart disease with heart failure: Secondary | ICD-10-CM | POA: Diagnosis not present

## 2019-12-12 DIAGNOSIS — L0889 Other specified local infections of the skin and subcutaneous tissue: Secondary | ICD-10-CM | POA: Diagnosis not present

## 2019-12-12 DIAGNOSIS — I5033 Acute on chronic diastolic (congestive) heart failure: Secondary | ICD-10-CM | POA: Diagnosis not present

## 2019-12-12 NOTE — Telephone Encounter (Signed)
Sherice calls to follow up on verbal orders for patient. Given per Dr. Dorise Bullion orders at this time.   Also requesting OT evaluation verbal order.   Verbal orders can be left on confidential voicemail at [phone].

## 2019-12-15 DIAGNOSIS — E662 Morbid (severe) obesity with alveolar hypoventilation: Secondary | ICD-10-CM | POA: Diagnosis not present

## 2019-12-15 DIAGNOSIS — J9610 Chronic respiratory failure, unspecified whether with hypoxia or hypercapnia: Secondary | ICD-10-CM | POA: Diagnosis not present

## 2019-12-16 ENCOUNTER — Other Ambulatory Visit: Payer: Self-pay | Admitting: *Deleted

## 2019-12-16 MED ORDER — VENLAFAXINE HCL ER 150 MG PO CP24: 150.0000 mg | ORAL_CAPSULE | Freq: Every day | ORAL | 3 refills | Status: DC

## 2019-12-18 ENCOUNTER — Ambulatory Visit: Payer: Medicare HMO

## 2019-12-18 ENCOUNTER — Other Ambulatory Visit: Payer: Self-pay

## 2019-12-18 DIAGNOSIS — I5033 Acute on chronic diastolic (congestive) heart failure: Secondary | ICD-10-CM | POA: Diagnosis not present

## 2019-12-18 DIAGNOSIS — J45909 Unspecified asthma, uncomplicated: Secondary | ICD-10-CM | POA: Diagnosis not present

## 2019-12-18 DIAGNOSIS — E876 Hypokalemia: Secondary | ICD-10-CM | POA: Diagnosis not present

## 2019-12-18 DIAGNOSIS — Z6841 Body Mass Index (BMI) 40.0 and over, adult: Secondary | ICD-10-CM | POA: Diagnosis not present

## 2019-12-18 DIAGNOSIS — I110 Hypertensive heart disease with heart failure: Secondary | ICD-10-CM | POA: Diagnosis not present

## 2019-12-18 DIAGNOSIS — G4733 Obstructive sleep apnea (adult) (pediatric): Secondary | ICD-10-CM | POA: Diagnosis not present

## 2019-12-18 DIAGNOSIS — L0889 Other specified local infections of the skin and subcutaneous tissue: Secondary | ICD-10-CM | POA: Diagnosis not present

## 2019-12-18 DIAGNOSIS — J9622 Acute and chronic respiratory failure with hypercapnia: Secondary | ICD-10-CM | POA: Diagnosis not present

## 2019-12-18 NOTE — Chronic Care Management (AMB) (Signed)
  Care Management   Outreach Note  12/18/2019 Name: [name] White MRN: [id number] DOB: February 26, 1956  Referred by: [name], MD Reason for referral : Care Coordination (Care Management RNCM  Breathing )   An unsuccessful telephone outreach was attempted today. The patient was referred to the case management team for assistance with care management and care coordination.   Follow Up Plan: A HIPPA compliant phone message was left for the patient providing contact information and requesting a return call.  The care management team will reach out to the patient again over the next 7-14 days.   Lazaro Arms RN, BSN, [facility] Care Management Coordinator [place] Phone: [phone] Fax: [phone]

## 2019-12-19 ENCOUNTER — Telehealth (INDEPENDENT_AMBULATORY_CARE_PROVIDER_SITE_OTHER): Payer: Medicare HMO | Admitting: Family Medicine

## 2019-12-19 ENCOUNTER — Other Ambulatory Visit: Payer: Self-pay

## 2019-12-19 ENCOUNTER — Telehealth: Payer: Medicare HMO

## 2019-12-19 ENCOUNTER — Ambulatory Visit: Payer: Medicare HMO | Attending: Critical Care Medicine

## 2019-12-19 DIAGNOSIS — Z23 Encounter for immunization: Secondary | ICD-10-CM

## 2019-12-19 NOTE — Progress Notes (Signed)
° °  Covid-19 Vaccination Clinic  Name:  [name] White    MRN: [id number] DOB: Sep 13, 1955  12/19/2019  [name] was observed post Covid-19 immunization for15 minuteswithout incident. She was provided with Vaccine Information Sheet and instruction to access the V-Safe system.   [name] was instructed to call 911 with any severe reactions post vaccine:  Difficulty breathing   Swelling of face and throat   A fast heartbeat   A bad rash all over body   Dizziness and weakness   Immunizations Administered    Name Date Dose VIS Date Route   Moderna COVID-19 Vaccine 12/19/2019  1:28 PM 0.5 mL 05/2019 Intramuscular   Manufacturer: Moderna   Lot: [id number]   [place]: 12508-719-94

## 2019-12-19 NOTE — Progress Notes (Signed)
Called patient for virtual visit. Her aide answered. Patient currently on trilogy and unable to attend the visit. Will no charge.  [name] MD PGY-3 Family Medicine Resident

## 2019-12-20 DIAGNOSIS — J9611 Chronic respiratory failure with hypoxia: Secondary | ICD-10-CM | POA: Diagnosis not present

## 2019-12-20 DIAGNOSIS — J9601 Acute respiratory failure with hypoxia: Secondary | ICD-10-CM | POA: Diagnosis not present

## 2019-12-20 DIAGNOSIS — J449 Chronic obstructive pulmonary disease, unspecified: Secondary | ICD-10-CM | POA: Diagnosis not present

## 2019-12-20 DIAGNOSIS — E1159 Type 2 diabetes mellitus with other circulatory complications: Secondary | ICD-10-CM | POA: Diagnosis not present

## 2019-12-23 DIAGNOSIS — I110 Hypertensive heart disease with heart failure: Secondary | ICD-10-CM | POA: Diagnosis not present

## 2019-12-23 DIAGNOSIS — I5033 Acute on chronic diastolic (congestive) heart failure: Secondary | ICD-10-CM | POA: Diagnosis not present

## 2019-12-23 DIAGNOSIS — E876 Hypokalemia: Secondary | ICD-10-CM | POA: Diagnosis not present

## 2019-12-23 DIAGNOSIS — J45909 Unspecified asthma, uncomplicated: Secondary | ICD-10-CM | POA: Diagnosis not present

## 2019-12-23 DIAGNOSIS — J9622 Acute and chronic respiratory failure with hypercapnia: Secondary | ICD-10-CM | POA: Diagnosis not present

## 2019-12-23 DIAGNOSIS — L0889 Other specified local infections of the skin and subcutaneous tissue: Secondary | ICD-10-CM | POA: Diagnosis not present

## 2019-12-23 DIAGNOSIS — G4733 Obstructive sleep apnea (adult) (pediatric): Secondary | ICD-10-CM | POA: Diagnosis not present

## 2019-12-23 DIAGNOSIS — Z6841 Body Mass Index (BMI) 40.0 and over, adult: Secondary | ICD-10-CM | POA: Diagnosis not present

## 2019-12-26 ENCOUNTER — Other Ambulatory Visit: Payer: Self-pay | Admitting: Family Medicine

## 2019-12-27 ENCOUNTER — Other Ambulatory Visit: Payer: Self-pay

## 2019-12-27 ENCOUNTER — Other Ambulatory Visit: Payer: Self-pay | Admitting: Nurse Practitioner

## 2019-12-27 ENCOUNTER — Other Ambulatory Visit: Payer: Self-pay | Admitting: Internal Medicine

## 2019-12-27 DIAGNOSIS — I471 Supraventricular tachycardia: Secondary | ICD-10-CM

## 2019-12-28 DIAGNOSIS — J449 Chronic obstructive pulmonary disease, unspecified: Secondary | ICD-10-CM | POA: Diagnosis not present

## 2019-12-30 DIAGNOSIS — N3281 Overactive bladder: Secondary | ICD-10-CM | POA: Diagnosis not present

## 2019-12-30 DIAGNOSIS — J441 Chronic obstructive pulmonary disease with (acute) exacerbation: Secondary | ICD-10-CM | POA: Diagnosis not present

## 2019-12-31 MED ORDER — APIXABAN 5 MG PO TABS: 5.0000 mg | ORAL_TABLET | Freq: Two times a day (BID) | ORAL | 1 refills | Status: AC

## 2019-12-31 MED ORDER — CETIRIZINE HCL 10 MG PO TABS: 10.0000 mg | ORAL_TABLET | Freq: Every day | ORAL | 1 refills | Status: DC

## 2019-12-31 MED ORDER — DILTIAZEM HCL ER COATED BEADS 120 MG PO CP24: 120.0000 mg | ORAL_CAPSULE | Freq: Every day | ORAL | 0 refills | Status: DC

## 2019-12-31 MED ORDER — GABAPENTIN 100 MG PO CAPS: 200.0000 mg | ORAL_CAPSULE | Freq: Three times a day (TID) | ORAL | 1 refills | Status: DC

## 2020-01-01 DIAGNOSIS — N3281 Overactive bladder: Secondary | ICD-10-CM | POA: Diagnosis not present

## 2020-01-02 ENCOUNTER — Telehealth: Payer: Self-pay

## 2020-01-02 ENCOUNTER — Other Ambulatory Visit: Payer: Self-pay | Admitting: *Deleted

## 2020-01-02 NOTE — Telephone Encounter (Signed)
Charise from [facility] calling for PT verbal orders as follows:  1 time(s) weekly for 4 week(s), and OT evaluation order  Verbal orders given per [facility] protocol  Talbot Grumbling, RN

## 2020-01-03 MED ORDER — HYDROXYZINE PAMOATE 25 MG PO CAPS: ORAL_CAPSULE | ORAL | 0 refills | Status: DC

## 2020-01-06 ENCOUNTER — Ambulatory Visit: Payer: Medicare HMO | Admitting: Emergency Medicine

## 2020-01-07 DIAGNOSIS — L0889 Other specified local infections of the skin and subcutaneous tissue: Secondary | ICD-10-CM | POA: Diagnosis not present

## 2020-01-07 DIAGNOSIS — I110 Hypertensive heart disease with heart failure: Secondary | ICD-10-CM | POA: Diagnosis not present

## 2020-01-07 DIAGNOSIS — Z6841 Body Mass Index (BMI) 40.0 and over, adult: Secondary | ICD-10-CM | POA: Diagnosis not present

## 2020-01-07 DIAGNOSIS — J45909 Unspecified asthma, uncomplicated: Secondary | ICD-10-CM | POA: Diagnosis not present

## 2020-01-07 DIAGNOSIS — J9622 Acute and chronic respiratory failure with hypercapnia: Secondary | ICD-10-CM | POA: Diagnosis not present

## 2020-01-07 DIAGNOSIS — E876 Hypokalemia: Secondary | ICD-10-CM | POA: Diagnosis not present

## 2020-01-07 DIAGNOSIS — I5033 Acute on chronic diastolic (congestive) heart failure: Secondary | ICD-10-CM | POA: Diagnosis not present

## 2020-01-07 DIAGNOSIS — J9611 Chronic respiratory failure with hypoxia: Secondary | ICD-10-CM | POA: Diagnosis not present

## 2020-01-07 DIAGNOSIS — G4733 Obstructive sleep apnea (adult) (pediatric): Secondary | ICD-10-CM | POA: Diagnosis not present

## 2020-01-09 DIAGNOSIS — I110 Hypertensive heart disease with heart failure: Secondary | ICD-10-CM | POA: Diagnosis not present

## 2020-01-09 DIAGNOSIS — Z6841 Body Mass Index (BMI) 40.0 and over, adult: Secondary | ICD-10-CM | POA: Diagnosis not present

## 2020-01-09 DIAGNOSIS — E876 Hypokalemia: Secondary | ICD-10-CM | POA: Diagnosis not present

## 2020-01-09 DIAGNOSIS — J9622 Acute and chronic respiratory failure with hypercapnia: Secondary | ICD-10-CM | POA: Diagnosis not present

## 2020-01-09 DIAGNOSIS — L0889 Other specified local infections of the skin and subcutaneous tissue: Secondary | ICD-10-CM | POA: Diagnosis not present

## 2020-01-09 DIAGNOSIS — J45909 Unspecified asthma, uncomplicated: Secondary | ICD-10-CM | POA: Diagnosis not present

## 2020-01-09 DIAGNOSIS — I5033 Acute on chronic diastolic (congestive) heart failure: Secondary | ICD-10-CM | POA: Diagnosis not present

## 2020-01-09 DIAGNOSIS — G4733 Obstructive sleep apnea (adult) (pediatric): Secondary | ICD-10-CM | POA: Diagnosis not present

## 2020-01-14 DIAGNOSIS — E662 Morbid (severe) obesity with alveolar hypoventilation: Secondary | ICD-10-CM | POA: Diagnosis not present

## 2020-01-14 DIAGNOSIS — J9610 Chronic respiratory failure, unspecified whether with hypoxia or hypercapnia: Secondary | ICD-10-CM | POA: Diagnosis not present

## 2020-01-16 ENCOUNTER — Ambulatory Visit: Payer: Medicare HMO | Attending: Internal Medicine

## 2020-01-16 DIAGNOSIS — Z23 Encounter for immunization: Secondary | ICD-10-CM

## 2020-01-16 NOTE — Progress Notes (Signed)
   Covid-19 Vaccination Clinic  Name:  [name] White    MRN: [id number] DOB: [birth year]  01/16/2020  [name] White was observed post Covid-19 immunization for 15 minutes without incident. She was provided with Vaccine Information Sheet and instruction to access the V-Safe system.   [name] White was instructed to call 911 with any severe reactions post vaccine: Marland Kitchen Difficulty breathing  . Swelling of face and throat  . A fast heartbeat  . A bad rash all over body  . Dizziness and weakness   Immunizations Administered    Name Date Dose VIS Date Route   Moderna COVID-19 Vaccine 01/16/2020 12:30 PM 0.5 mL 05/2019 Intramuscular   Manufacturer: Moderna   Lot: [id number]   [place]: 82505-397-67

## 2020-01-19 DIAGNOSIS — E1159 Type 2 diabetes mellitus with other circulatory complications: Secondary | ICD-10-CM | POA: Diagnosis not present

## 2020-01-19 DIAGNOSIS — J9601 Acute respiratory failure with hypoxia: Secondary | ICD-10-CM | POA: Diagnosis not present

## 2020-01-19 DIAGNOSIS — J449 Chronic obstructive pulmonary disease, unspecified: Secondary | ICD-10-CM | POA: Diagnosis not present

## 2020-01-19 DIAGNOSIS — J9611 Chronic respiratory failure with hypoxia: Secondary | ICD-10-CM | POA: Diagnosis not present

## 2020-01-24 DIAGNOSIS — N3281 Overactive bladder: Secondary | ICD-10-CM | POA: Diagnosis not present

## 2020-01-24 DIAGNOSIS — J441 Chronic obstructive pulmonary disease with (acute) exacerbation: Secondary | ICD-10-CM | POA: Diagnosis not present

## 2020-01-28 DIAGNOSIS — J449 Chronic obstructive pulmonary disease, unspecified: Secondary | ICD-10-CM | POA: Diagnosis not present

## 2020-01-29 ENCOUNTER — Other Ambulatory Visit: Payer: Self-pay | Admitting: Internal Medicine

## 2020-01-29 ENCOUNTER — Other Ambulatory Visit: Payer: Self-pay | Admitting: Student in an Organized Health Care Education/Training Program

## 2020-01-30 ENCOUNTER — Other Ambulatory Visit: Payer: Self-pay | Admitting: Family Medicine

## 2020-01-30 NOTE — Telephone Encounter (Addendum)
LV message for patient to call [facility] and schedule appointment.  [name] White, [place]

## 2020-02-01 DIAGNOSIS — N3281 Overactive bladder: Secondary | ICD-10-CM | POA: Diagnosis not present

## 2020-02-03 ENCOUNTER — Other Ambulatory Visit: Payer: Self-pay | Admitting: *Deleted

## 2020-02-03 NOTE — Telephone Encounter (Signed)
Rx request says 10mg  instead of 5mg . Please advise. Mar Walmer Kennon Holter, CMA

## 2020-02-04 ENCOUNTER — Other Ambulatory Visit: Payer: Self-pay

## 2020-02-04 ENCOUNTER — Telehealth: Payer: Self-pay | Admitting: *Deleted

## 2020-02-04 MED ORDER — CYCLOBENZAPRINE HCL 5 MG PO TABS: 5.0000 mg | ORAL_TABLET | Freq: Two times a day (BID) | ORAL | 0 refills | Status: DC | PRN

## 2020-02-04 NOTE — Telephone Encounter (Signed)
Pt calls to speak with Traci ( case manager)  She was exposed to covid by her PCA from [facility] care and does not want to be use their services anymore.  She also wants to be tested for covid as she is not feeling well. Pt states that she is homebound and cant get out to get tested.   She states that Tressia Miners was able to get her a shot from a mobile unit and wants to know if we can do that with testing.  To Traci. Christen Bame, CMA

## 2020-02-06 ENCOUNTER — Observation Stay (HOSPITAL_COMMUNITY)
Admission: EM | Admit: 2020-02-06 | Discharge: 2020-02-08 | Disposition: A | Discharge status: Home / Self Care | Payer: Medicare HMO | Attending: Family Medicine | Admitting: Family Medicine

## 2020-02-06 ENCOUNTER — Other Ambulatory Visit: Payer: Self-pay | Admitting: Family Medicine

## 2020-02-06 ENCOUNTER — Other Ambulatory Visit: Payer: Self-pay

## 2020-02-06 ENCOUNTER — Emergency Department (HOSPITAL_COMMUNITY): Payer: Medicare HMO

## 2020-02-06 ENCOUNTER — Encounter (HOSPITAL_COMMUNITY): Payer: Self-pay

## 2020-02-06 DIAGNOSIS — I4891 Unspecified atrial fibrillation: Secondary | ICD-10-CM

## 2020-02-06 DIAGNOSIS — I480 Paroxysmal atrial fibrillation: Secondary | ICD-10-CM | POA: Insufficient documentation

## 2020-02-06 DIAGNOSIS — I10 Essential (primary) hypertension: Secondary | ICD-10-CM | POA: Insufficient documentation

## 2020-02-06 DIAGNOSIS — R509 Fever, unspecified: Secondary | ICD-10-CM | POA: Insufficient documentation

## 2020-02-06 DIAGNOSIS — J181 Lobar pneumonia, unspecified organism: Secondary | ICD-10-CM | POA: Diagnosis not present

## 2020-02-06 DIAGNOSIS — R0602 Shortness of breath: Secondary | ICD-10-CM

## 2020-02-06 DIAGNOSIS — J189 Pneumonia, unspecified organism: Secondary | ICD-10-CM | POA: Diagnosis not present

## 2020-02-06 DIAGNOSIS — J9601 Acute respiratory failure with hypoxia: Secondary | ICD-10-CM | POA: Diagnosis not present

## 2020-02-06 DIAGNOSIS — Z7901 Long term (current) use of anticoagulants: Secondary | ICD-10-CM | POA: Insufficient documentation

## 2020-02-06 DIAGNOSIS — Z79899 Other long term (current) drug therapy: Secondary | ICD-10-CM | POA: Insufficient documentation

## 2020-02-06 DIAGNOSIS — Z86711 Personal history of pulmonary embolism: Secondary | ICD-10-CM | POA: Diagnosis not present

## 2020-02-06 DIAGNOSIS — J9621 Acute and chronic respiratory failure with hypoxia: Secondary | ICD-10-CM

## 2020-02-06 DIAGNOSIS — R5383 Other fatigue: Secondary | ICD-10-CM | POA: Diagnosis not present

## 2020-02-06 DIAGNOSIS — Z20822 Contact with and (suspected) exposure to covid-19: Secondary | ICD-10-CM | POA: Insufficient documentation

## 2020-02-06 DIAGNOSIS — R609 Edema, unspecified: Secondary | ICD-10-CM | POA: Diagnosis not present

## 2020-02-06 DIAGNOSIS — J9622 Acute and chronic respiratory failure with hypercapnia: Secondary | ICD-10-CM

## 2020-02-06 DIAGNOSIS — R000 Tachycardia, unspecified: Secondary | ICD-10-CM | POA: Diagnosis not present

## 2020-02-06 DIAGNOSIS — R0689 Other abnormalities of breathing: Secondary | ICD-10-CM | POA: Diagnosis not present

## 2020-02-06 DIAGNOSIS — J449 Chronic obstructive pulmonary disease, unspecified: Secondary | ICD-10-CM | POA: Diagnosis not present

## 2020-02-06 DIAGNOSIS — J9811 Atelectasis: Secondary | ICD-10-CM | POA: Diagnosis not present

## 2020-02-06 LAB — I-STAT VENOUS BLOOD GAS, ED
Acid-Base Excess: 18 mmol/L — ABNORMAL HIGH (ref 0.0–2.0)
Bicarbonate: 44 mmol/L — ABNORMAL HIGH (ref 20.0–28.0)
Calcium, Ion: 0.97 mmol/L — ABNORMAL LOW (ref 1.15–1.40)
HCT: 39 % (ref 36.0–46.0)
Hemoglobin: 13.3 g/dL (ref 12.0–15.0)
O2 Saturation: 100 %
Potassium: 4.4 mmol/L (ref 3.5–5.1)
Sodium: 140 mmol/L (ref 135–145)
TCO2: 46 mmol/L — ABNORMAL HIGH (ref 22–32)
pCO2, Ven: 57.2 mmHg (ref 44.0–60.0)
pH, Ven: 7.494 — ABNORMAL HIGH (ref 7.250–7.430)
pO2, Ven: 179 mmHg — ABNORMAL HIGH (ref 32.0–45.0)

## 2020-02-06 LAB — COMPREHENSIVE METABOLIC PANEL
ALT: 20 U/L (ref 0–44)
AST: 35 U/L (ref 15–41)
Albumin: 3.2 g/dL — ABNORMAL LOW (ref 3.5–5.0)
Alkaline Phosphatase: 90 U/L (ref 38–126)
Anion gap: 11 (ref 5–15)
BUN: 12 mg/dL (ref 8–23)
CO2: 33 mmol/L — ABNORMAL HIGH (ref 22–32)
Calcium: 8.5 mg/dL — ABNORMAL LOW (ref 8.9–10.3)
Chloride: 96 mmol/L — ABNORMAL LOW (ref 98–111)
Creatinine, Ser: 0.54 mg/dL (ref 0.44–1.00)
GFR calc Af Amer: >60 mL/min (ref >60)
GFR calc non Af Amer: >60 mL/min (ref >60)
Glucose, Bld: 94 mg/dL (ref 70–99)
Potassium: 4.4 mmol/L (ref 3.5–5.1)
Sodium: 140 mmol/L (ref 135–145)
Total Bilirubin: 1 mg/dL (ref 0.3–1.2)
Total Protein: 7.1 g/dL (ref 6.5–8.1)

## 2020-02-06 LAB — CBC WITH DIFFERENTIAL/PLATELET
Abs Immature Granulocytes: 0.04 10*3/uL (ref 0.00–0.07)
Basophils Absolute: 0.1 10*3/uL (ref 0.0–0.1)
Basophils Relative: 1 %
Eosinophils Absolute: 0.3 10*3/uL (ref 0.0–0.5)
Eosinophils Relative: 3 %
HCT: 40.4 % (ref 36.0–46.0)
Hemoglobin: 11.9 g/dL — ABNORMAL LOW (ref 12.0–15.0)
Immature Granulocytes: 1 %
Lymphocytes Relative: 17 %
Lymphs Abs: 1.5 10*3/uL (ref 0.7–4.0)
MCH: 28.6 pg (ref 26.0–34.0)
MCHC: 29.5 g/dL — ABNORMAL LOW (ref 30.0–36.0)
MCV: 97.1 fL (ref 80.0–100.0)
Monocytes Absolute: 0.7 10*3/uL (ref 0.1–1.0)
Monocytes Relative: 8 %
Neutro Abs: 6.1 10*3/uL (ref 1.7–7.7)
Neutrophils Relative %: 70 %
Platelets: 418 10*3/uL — ABNORMAL HIGH (ref 150–400)
RBC: 4.16 MIL/uL (ref 3.87–5.11)
RDW: 17.6 % — ABNORMAL HIGH (ref 11.5–15.5)
WBC: 8.6 10*3/uL (ref 4.0–10.5)
nRBC: 0 % (ref 0.0–0.2)

## 2020-02-06 LAB — MAGNESIUM: Magnesium: 1.8 mg/dL (ref 1.7–2.4)

## 2020-02-06 LAB — LACTIC ACID, PLASMA: Lactic Acid, Venous: 0.9 mmol/L (ref 0.5–1.9)

## 2020-02-06 LAB — PROTIME-INR
INR: 0.9 (ref 0.8–1.2)
Prothrombin Time: 11.9 seconds (ref 11.4–15.2)

## 2020-02-06 LAB — SARS CORONAVIRUS 2 BY RT PCR (HOSPITAL ORDER, PERFORMED IN ~~LOC~~ HOSPITAL LAB): SARS Coronavirus 2: NEGATIVE

## 2020-02-06 MED ORDER — IPRATROPIUM-ALBUTEROL 0.5-2.5 (3) MG/3ML IN SOLN
3.0000 mL | Freq: Once | RESPIRATORY_TRACT | Status: AC
  Administered 2020-02-06: 3 mL via RESPIRATORY_TRACT
  Filled 2020-02-06: qty 3

## 2020-02-06 MED ORDER — ATORVASTATIN CALCIUM 40 MG PO TABS
40.0000 mg | ORAL_TABLET | Freq: Every evening | ORAL | Status: DC
  Administered 2020-02-07 – 2020-02-08 (×2): 40 mg via ORAL
  Filled 2020-02-06 (×2): qty 1

## 2020-02-06 MED ORDER — SODIUM CHLORIDE 0.9 % IV SOLN
1.0000 g | Freq: Once | INTRAVENOUS | Status: AC
  Administered 2020-02-06: 1 g via INTRAVENOUS
  Filled 2020-02-06: qty 10

## 2020-02-06 MED ORDER — METOPROLOL TARTRATE 25 MG PO TABS
12.5000 mg | ORAL_TABLET | Freq: Once | ORAL | Status: AC
  Administered 2020-02-06: 12.5 mg via ORAL
  Filled 2020-02-06: qty 1

## 2020-02-06 MED ORDER — GABAPENTIN 100 MG PO CAPS
200.0000 mg | ORAL_CAPSULE | Freq: Three times a day (TID) | ORAL | Status: DC
  Administered 2020-02-07 – 2020-02-08 (×7): 200 mg via ORAL
  Filled 2020-02-06 (×7): qty 2

## 2020-02-06 MED ORDER — FLUTICASONE PROPIONATE 50 MCG/ACT NA SUSP
2.0000 | Freq: Every day | NASAL | Status: DC | PRN
  Filled 2020-02-06: qty 16

## 2020-02-06 MED ORDER — FAMOTIDINE 20 MG PO TABS
20.0000 mg | ORAL_TABLET | Freq: Every day | ORAL | Status: DC
  Administered 2020-02-07 – 2020-02-08 (×2): 20 mg via ORAL
  Filled 2020-02-06 (×2): qty 1

## 2020-02-06 MED ORDER — SODIUM CHLORIDE 0.9 % IV SOLN
500.0000 mg | Freq: Once | INTRAVENOUS | Status: AC
  Administered 2020-02-06: 500 mg via INTRAVENOUS
  Filled 2020-02-06: qty 500

## 2020-02-06 MED ORDER — ALBUTEROL SULFATE (2.5 MG/3ML) 0.083% IN NEBU: 2.5000 mg | INHALATION_SOLUTION | RESPIRATORY_TRACT | Status: DC | PRN

## 2020-02-06 MED ORDER — MELATONIN 3 MG PO TABS
3.0000 mg | ORAL_TABLET | Freq: Every day | ORAL | Status: DC
  Administered 2020-02-07 – 2020-02-08 (×3): 3 mg via ORAL
  Filled 2020-02-06 (×3): qty 1

## 2020-02-06 MED ORDER — VENLAFAXINE HCL 37.5 MG PO TABS
37.5000 mg | ORAL_TABLET | Freq: Every day | ORAL | Status: DC
  Administered 2020-02-07 – 2020-02-08 (×2): 37.5 mg via ORAL
  Filled 2020-02-06 (×3): qty 1

## 2020-02-06 MED ORDER — FUROSEMIDE 40 MG PO TABS
60.0000 mg | ORAL_TABLET | Freq: Every day | ORAL | Status: DC
  Administered 2020-02-07 – 2020-02-08 (×2): 60 mg via ORAL
  Filled 2020-02-06 (×2): qty 1

## 2020-02-06 MED ORDER — ACETAMINOPHEN 650 MG RE SUPP: 650.0000 mg | Freq: Four times a day (QID) | RECTAL | Status: DC | PRN

## 2020-02-06 MED ORDER — POLYETHYLENE GLYCOL 3350 17 G PO PACK: 17.0000 g | PACK | Freq: Every day | ORAL | Status: DC | PRN

## 2020-02-06 MED ORDER — FLECAINIDE ACETATE 100 MG PO TABS
100.0000 mg | ORAL_TABLET | Freq: Two times a day (BID) | ORAL | Status: DC
  Administered 2020-02-07 – 2020-02-08 (×5): 100 mg via ORAL
  Filled 2020-02-06 (×4): qty 1

## 2020-02-06 MED ORDER — METOPROLOL TARTRATE 25 MG PO TABS
12.5000 mg | ORAL_TABLET | Freq: Two times a day (BID) | ORAL | Status: DC
  Administered 2020-02-07 – 2020-02-08 (×4): 12.5 mg via ORAL
  Filled 2020-02-06 (×4): qty 1

## 2020-02-06 MED ORDER — APIXABAN 5 MG PO TABS
5.0000 mg | ORAL_TABLET | Freq: Two times a day (BID) | ORAL | Status: DC
  Administered 2020-02-07 – 2020-02-08 (×5): 5 mg via ORAL
  Filled 2020-02-06 (×5): qty 1

## 2020-02-06 MED ORDER — DILTIAZEM HCL ER COATED BEADS 120 MG PO CP24
120.0000 mg | ORAL_CAPSULE | Freq: Every day | ORAL | Status: DC
  Administered 2020-02-07 – 2020-02-08 (×2): 120 mg via ORAL
  Filled 2020-02-06 (×3): qty 1

## 2020-02-06 MED ORDER — IPRATROPIUM-ALBUTEROL 0.5-2.5 (3) MG/3ML IN SOLN
3.0000 mL | Freq: Four times a day (QID) | RESPIRATORY_TRACT | Status: DC | PRN
  Administered 2020-02-07 – 2020-02-08 (×4): 3 mL via RESPIRATORY_TRACT
  Filled 2020-02-06 (×4): qty 3

## 2020-02-06 MED ORDER — ACETAMINOPHEN 325 MG PO TABS
650.0000 mg | ORAL_TABLET | Freq: Four times a day (QID) | ORAL | Status: DC | PRN
  Administered 2020-02-07 – 2020-02-08 (×4): 650 mg via ORAL
  Filled 2020-02-06 (×4): qty 2

## 2020-02-06 MED ORDER — PANTOPRAZOLE SODIUM 40 MG PO TBEC
40.0000 mg | DELAYED_RELEASE_TABLET | Freq: Every day | ORAL | Status: DC
  Administered 2020-02-07 – 2020-02-08 (×2): 40 mg via ORAL
  Filled 2020-02-06 (×2): qty 1

## 2020-02-06 MED ORDER — IOHEXOL 350 MG/ML SOLN
100.0000 mL | Freq: Once | INTRAVENOUS | Status: AC | PRN
  Administered 2020-02-06: 100 mL via INTRAVENOUS

## 2020-02-06 MED ORDER — MONTELUKAST SODIUM 10 MG PO TABS
10.0000 mg | ORAL_TABLET | Freq: Every day | ORAL | Status: DC
  Administered 2020-02-07 – 2020-02-08 (×3): 10 mg via ORAL
  Filled 2020-02-06 (×3): qty 1

## 2020-02-06 MED ORDER — FLECAINIDE ACETATE 100 MG PO TABS
100.0000 mg | ORAL_TABLET | Freq: Once | ORAL | Status: AC
  Administered 2020-02-07: 100 mg via ORAL
  Filled 2020-02-06 (×2): qty 1

## 2020-02-06 MED ORDER — VENLAFAXINE HCL ER 150 MG PO CP24
150.0000 mg | ORAL_CAPSULE | Freq: Every day | ORAL | Status: DC
  Administered 2020-02-07 – 2020-02-08 (×2): 150 mg via ORAL
  Filled 2020-02-06 (×3): qty 1

## 2020-02-06 MED ORDER — METHYLPREDNISOLONE SODIUM SUCC 125 MG IJ SOLR
125.0000 mg | Freq: Once | INTRAMUSCULAR | Status: AC
  Administered 2020-02-06: 125 mg via INTRAVENOUS
  Filled 2020-02-06: qty 2

## 2020-02-06 MED ORDER — LORATADINE 10 MG PO TABS
10.0000 mg | ORAL_TABLET | Freq: Every day | ORAL | Status: DC
  Administered 2020-02-07 – 2020-02-08 (×2): 10 mg via ORAL
  Filled 2020-02-06 (×2): qty 1

## 2020-02-06 MED ORDER — DICLOFENAC SODIUM 1 % EX GEL
2.0000 g | Freq: Four times a day (QID) | CUTANEOUS | Status: DC | PRN
  Administered 2020-02-07 – 2020-02-08 (×2): 2 g via TOPICAL
  Filled 2020-02-06: qty 100

## 2020-02-06 MED ORDER — AZELASTINE HCL 0.1 % NA SOLN
2.0000 | Freq: Every day | NASAL | Status: DC
  Administered 2020-02-07 – 2020-02-08 (×2): 2 via NASAL
  Filled 2020-02-06: qty 30

## 2020-02-06 MED ORDER — POTASSIUM CHLORIDE CRYS ER 10 MEQ PO TBCR
20.0000 meq | EXTENDED_RELEASE_TABLET | Freq: Every day | ORAL | Status: DC
  Administered 2020-02-07 – 2020-02-08 (×2): 20 meq via ORAL
  Filled 2020-02-06 (×3): qty 2

## 2020-02-06 MED ORDER — HYDROXYZINE HCL 25 MG PO TABS
25.0000 mg | ORAL_TABLET | Freq: Every day | ORAL | Status: DC
  Administered 2020-02-07: 25 mg via ORAL
  Filled 2020-02-06: qty 1

## 2020-02-06 MED ORDER — DILTIAZEM HCL 60 MG PO TABS
120.0000 mg | ORAL_TABLET | Freq: Once | ORAL | Status: AC
  Administered 2020-02-06: 120 mg via ORAL
  Filled 2020-02-06: qty 2

## 2020-02-06 NOTE — ED Provider Notes (Signed)
I saw and evaluated the patient, reviewed the resident's note and I agree with the findings and plan.  EKG:   64 year old female presents here with cough and shortness of breath.  Concern for possible Covid exposure.  Patient is fully vaccinated.  Is on oxygen chronically.  On exam she is tachypneic but is able to carry a conversation.  Will check x-ray and Covid test and reassess   Lacretia Leigh, MD 02/06/20 1333

## 2020-02-06 NOTE — ED Provider Notes (Signed)
Transfer of Care Note I assumed care of [name] White on 02/06/2020 at @NOWNR @.  Briefly, [name] White is a 63 y.o. female who:  P/w multiple complaints inclduing SOB, myalgias, HA.  Tmax 99.8 at home.  H/o AoC Resp failure and is on 6L at home.  Labs ok.  COVID pending.    The plan includes:  F/u COVID and possible CT-PE given h/o PE.    Please refer to the original provider's note for additional information regarding the care of [name] White.  Reassessment: Vital Signs:  The most current vitals were  Vitals:   02/06/20 1304 02/06/20 1308  BP: (!) 137/101   Pulse: (!) 106   Resp: (!) 24   Temp:  99.5 F (37.5 C)  SpO2: 99%     Hemodynamics:  The patient is hemodynamically stable. Mental Status:  The patient is Alert  Additional MDM: Covid negative.  CT PE was obtained which showed concern for left lower lobe pneumonia without evidence of thromboembolism.  On reevaluation patient was on 8 L of oxygen and appeared to be uncomfortable.  Patient was decreased to 6 L and had saturations above 95%.  Patient continued to complain of shortness of breath as well as cough.  Given patient's history of cough and findings consistent with pneumonia we will treat patient with CAP.  Patient also had heart rate in the 130s.  Patient states that she has a history of A. fib and is on multiple rate and rhythm controlling medications however has not taken them today.  After chart review patient is currently on Lopressor, diltiazem, flecainide which were ordered for the patient.  Given patient's consistent shortness of breath as well as A. fib with RVR feel the patient will require admission for IV antibiotics as well as for management of her A. fib with RVR.  Discussed this with the family medicine team who evaluated the patient agreed with admission to their service.  Patient was admitted to the family medicine service in stable condition.  Patient had no further events during my shift.     Silvestre Gunner, MD 02/06/20 2209    [name], Gwenyth Allegra, MD 02/06/20 [phone]

## 2020-02-06 NOTE — H&P (Addendum)
[facility] Admission History and Physical Service Pager: [phone]  Patient name: [name] White Medical record number: [id number] Date of birth: 1955/08/21 Age: 64 y.o. Gender: female  Primary Care Provider: Richarda Osmond, DO Consultants: None Code Status: DNR Preferred Emergency Contact: Jonelle Sidle (niece) [phone]  Chief Complaint:   Assessment and Plan: [name] White is a 64 y.o. female presenting with SOB, HA, myalgias, arthralgias, and chills. PMH is significant for   Paroxysmal A fib, respiratory failure chronically on 6 L [place], OSA. HFpEF , T2DM, MDD, HTN, HLD,  PE, and GERD.  Acute on chronic respiratory failure with hypoxia Patient reports recently feeling more short of breath than usual.  Home health care nurse recently tested positive for COVID however patient is vaccinated.  COVID test in the ED was negative for patient.  CXR at admission suggested stable elevation of the right hemidiaphragm and minimal left basilar atelectasis with otherwise no acute cardiopulmonary processes.  Physical exam on admission patient noted to have significant SOB and upper airway wheezing with inspiratory crackles in all lobes.  CTA chest PE and admission found no CT evidence of central pulmonary artery embolus there was evidence of bilateral consolidative changes which may represent atelectasis or pneumonia.  Differential includes viral pneumonia vs bacterial pneumonia vs chronic respiratory failure exacerbation vs CHF exacerbation.  Viral and bacterial pneumonia are less likely as WBC remains within normal limits.  Also remained afebrile since presentation to the ED.  However, CT does increase suspicion for possible pneumonia. Pt has frequent admissions for respiratory failure which are likely attributed to her OHS and OSA. Recent PFTs indicate it is unlikelyy she has COPD or Asthma.  It doesn't appear pt is taking her scheduled inhaler but these were not likely  providing much benefit given her PFT scores.  CMP does not report significant electrolyte abnormalities. Na at admission 140 and K 4.4. Lactic acid WNL. VBG pH 7.49, pO2 179, and HCO3 44.  Patient is on 6L [place] at home and was satting below 90% on 5L [place] in the ED. Increased to 6L and O2 sat 92%. Patient's home medications include Singulair, and Ventolin HFA inhaler, and Trilogy Phillips forced air machine.  ED started patient on azithromycin and ceftriaxone as well as steroid bolus.  Patient PFTs, 06/2018, do not correlate with COPD diagnosis decreasing suspicion for COPD exacerbation.  We will not continue steroids.  -Admit to MedTele, FPTS, Dr. Ardelia Mems -Continuous pulse ox -f/u Bcx x 2 - f/u BNP -f/u UA -f/u Ucx - increase [place] w/ goal 90-94% - duoneb q6h prn -Continue azithromycin and ceftriaxone. Low threshold to d/c given limited evidence of infectious cause.  - Discontinue steroids at this time -Continue home medications - BiPAP QHS  Atrial fibrillation Patient has paroxysmal atrial fibrillation at baseline.  EKG noted A. fib with increased rate in the 130-40s range.  Of note patient has not taken rate control medications on day of admission as she had been in the ED.  ED ordered her home medications including: Diltiazem, flecainide, metoprolol tartrate, and apixaban. Expect her BP and HR to improve with administration of home medications. Mg 1.8. K 4.4.  PT INR at admission 11.9 and 0.9, respectively. -Continue home medications -Continue telemetry  HFpEF Patient has a history of heart failure most recent echo had EF of 60 to 65% with mildly dilated left atrium but severely hypertrophied left ventricle.  Patient takes furosemide 60 daily.  Patient notes swelling bilateral lower extremities with the  left slightly more edematous than the right chronically. Mild b/l basilar crackles appreciated. Leg swelling appears to be mostly lipidema.  -Continue home medication: Furosemide 60 daily -May  consider US DVT of L LE  -Low-sodium diet with 1500 mL fluid restriction -Monitor I's/O  GERD Patient has a history of GERD and takes famotidine and protonix at home. -Continue home medication.  Hospital formulary does not contain omeprazole patient transitioned to pantoprazole inpatient.  Prediabetes Patient has a history of A1c between 6-6.5 with one value of 7.8. CBG at presentation was 94.  Patient has no home medications as it is diet controlled. -We will continue to monitor -Continue control with meals -f/u HgbA1c  Hx of PE  Pt has a hx of PE and complains of chronic leg swelling/pain, left greater than right.  todays exam is consistent with previous admissions.  CTA did not show evidence of PE.  - continue eliquis.   HTN Patient has a history of hypertension at baseline.  BP ranged 137/101-173/156 on presentation. Pt had not taken medications today. Will monitor for improvement after addition of home medications.  Home medications include diltiazem and metoprolol. -Continue home medications  HLD Patient has a history of HLD.  Her medications include Lipitor.  -Continue home medications   RLS Patient has a history of restless leg syndrome.  Takes, gabapentin,Voltaren gel, and Tylenol at home. -Continue home medications.  MDD Patient has a history of MDD, home meds include venlafaxine XR and venlafaxine and hydroxyzine. -Continue home medications  FEN/GI: Low-sodium with 1500 mL fluid restriction Prophylaxis: Home Eliquis  Disposition: Med telemetry  History of Present Illness:  [name] White is a 64 y.o. female presenting with SOB, HA, myalgias.  Patient began feeling bad Monday night with initial symptoms of fatigue.  Patient began having headache, nonproductive cough, subjective fever, chills/sweats, and continued to have fatigue and weakness with increased shortness of breath.  Patient received a voicemail on Tuesday that her home health worker tested positive  for COVID and that she should go to the hospital.  Of note home health worker was not vaccinated patient received her second dose of Moderna vaccine January 16, 2020.  Patient also noted bilateral edema with the left greater than the right however she has noticed that this has happened before.  She does not report diarrhea or vomiting but did report nausea and abdominal pain.  She also endorsed poor sleep as she was uncomfortable due to myalgias and increased dyspnea.  She did report using trilogy machine every night and has has had some daytime use as well.    ED course: CXR and CT PE.  Negative for COVID-19.  Routine labs.  Gave patient metoprolol, diltiazem, flecainide as patient not taken her dose for the day while waiting in the ED.  Post CT patient started on azithromycin and ceftriaxone.  Patient also received prednisone bolus.  Review Of Systems: Per HPI with the following additions:   Review of Systems  Constitutional: Positive for chills and fatigue. Negative for appetite change.  Respiratory: Positive for apnea, cough and shortness of breath.   Cardiovascular: Positive for leg swelling.  Gastrointestinal: Positive for abdominal pain and nausea. Negative for diarrhea and vomiting.  Genitourinary: Negative for difficulty urinating.  Musculoskeletal: Positive for arthralgias and myalgias.  Neurological: Positive for dizziness and headaches.     Patient Active Problem List   Diagnosis Date Noted  . SOB (shortness of breath) 11/24/2019  . Respiratory distress   . Respiratory failure, acute-on-chronic (  [place]) 11/09/2019  . Seasonal allergies 10/03/2019  . Debility 08/02/2019  . Hypertension associated with diabetes ([place])   . Weight gain due to medication 06/05/2019  . Acute on chronic heart failure with preserved ejection fraction ([place]) 03/14/2019  . Acquired thrombophilia ([place]) due to A-Fib 03/07/2019  . Heart failure with preserved ejection fraction ([place]) 03/06/2019  . Obesity  hypoventilation syndrome ([place]) 03/06/2019  . Lower GI bleed   . Urge incontinence 01/27/2019  . Shortness of breath 08/29/2018  . OSA (obstructive sleep apnea) 07/05/2018  . Respiratory failure ([place]) 07/05/2018  . Severe episode of recurrent major depressive disorder, without psychotic features ([place])   . MDD (major depressive disorder), recurrent episode, severe ([place]) 09/07/2015  . Atrial flutter ([place]) 07/29/2015  . Asthma 11/12/2014  . History of hypertension 11/05/2014  . Paroxysmal atrial fibrillation ([place]) 11/03/2014  . Chronic anticoagulation 11/03/2014  . Major depressive disorder 11/03/2014  . History of pulmonary embolus (PE) 11/03/2014  . MDD (major depressive disorder), recurrent severe, without psychosis ([place]) 03/05/2014  . Hot flashes 04/22/2011  . OVERACTIVE BLADDER 04/18/2008  . Osteoarthritis of both knees 04/18/2008  . Osteoarthritis involving multiple joints on both sides of body 09/14/2007  . Morbid obesity ([place]) 08/24/2006  . RESTLESS LEGS SYNDROME 08/24/2006  . HYPERTENSION, BENIGN SYSTEMIC 08/24/2006  . RHINITIS, ALLERGIC 08/24/2006  . GASTROESOPHAGEAL REFLUX, NO ESOPHAGITIS 08/24/2006    Past Medical History: Past Medical History:  Diagnosis Date  . Abdominal wall hematoma 03/06/2019  . Acute bronchitis 08/29/2018  . Acute on chronic respiratory failure with hypoxia ([place]) 08/29/2018  . Anxiety   . Asthma   . Chronic lower back pain   . COPD (chronic obstructive pulmonary disease) ([place])   . Depression   . Exposure to COVID-19 virus 11/02/2018  . Fall 02/2019  . Family history of anesthesia complication    "daughter; causes her to pass out afterwards"  . GERD (gastroesophageal reflux disease)   . History of atrial flutter 06/26/2015  . History of pulmonary embolus (PE) 11/03/2014  . Hypertension   . Hyperthyroidism   . Left medial tibial stress syndrome 12/26/2017  . Lower GI bleed   . Migraine    "monthly" (12/28/2013)  . Non-traumatic rhabdomyolysis 11/03/2014   . Obesity hypoventilation syndrome ([place]) 03/06/2019  . Osteoarthritis    "both knees; back of my neck; right pelvic bone" (12/28/2013)  . Paroxysmal A-fib ([place])   . Pulmonary embolism ([place]) 12/28/2013   "2 clots in each lung"    Past Surgical History: Past Surgical History:  Procedure Laterality Date  . ABDOMINAL HYSTERECTOMY    . APPENDECTOMY    . BREAST CYST EXCISION Right   . DILATION AND CURETTAGE OF UTERUS    . ELECTROPHYSIOLOGIC STUDY N/A 05/27/2015   Procedure: A-Flutter;  Surgeon: Evans Lance, MD;  Location: [facility];  Service: Cardiovascular;  Laterality: N/A;  . EXCISIONAL HEMORRHOIDECTOMY    . NASAL SINUS SURGERY  2007  . THYROIDECTOMY, PARTIAL Right 2005  . TUBAL LIGATION    . WISDOM TOOTH EXTRACTION      Social History: Social History   Tobacco Use  . Smoking status: Former Smoker    Packs/day: 0.25    Years: 20.00    Pack years: 5.00    Types: Cigarettes    Quit date: 07/17/1999    Years since quitting: 20.5  . Smokeless tobacco: Never Used  Vaping Use  . Vaping Use: Never used  Substance Use Topics  . Alcohol use: Not  Currently    Comment: "drank some in my 30's"  . Drug use: No   Please also refer to relevant sections of EMR.  Family History: Family History  Problem Relation Age of Onset  . Osteoarthritis Mother   . Asthma Mother   . Heart failure Mother   . Breast cancer Daughter     Allergies and Medications: Allergies  Allergen Reactions  . Caffeine Other (See Comments)    Migraine  . Hydralazine Hcl Other (See Comments)    Other reaction(s): Other (See Comments) AKI leading to rhabdo and electrolyte abnormalities  . Hydrocodone Nausea And Vomiting    Headache  . Ciprofloxacin Hives and Rash  . Erythromycin Hives and Rash  . Lisinopril Cough  . Oxycodone Nausea And Vomiting    Headache  . Sulfamethoxazole-Trimethoprim Rash   No current facility-administered medications on file prior to encounter.   Current Outpatient  Medications on File Prior to Encounter  Medication Sig Dispense Refill  . acetaminophen (TYLENOL) 325 MG tablet Take 2 tablets (650 mg total) by mouth every 6 (six) hours as needed for mild pain.    Marland Kitchen apixaban (ELIQUIS) 5 MG TABS tablet Take 1 tablet (5 mg total) by mouth 2 (two) times daily. 180 tablet 1  . atorvastatin (LIPITOR) 40 MG tablet Take 1 tablet (40 mg total) by mouth daily. 90 tablet 3  . azelastine (ASTELIN) 0.1 % nasal spray Place 2 sprays into both nostrils 2 (two) times daily. Use in each nostril as directed (Patient taking differently: Place 2 sprays into both nostrils daily. ) 30 mL 0  . azelastine (OPTIVAR) 0.05 % ophthalmic solution INSTILL 1 DROP IN BOTH EYES TWICE DAILY AS NEEDED FOR ITCHY EYES 6 mL 1  . cetirizine (ZYRTEC) 10 MG tablet Take 1 tablet (10 mg total) by mouth daily. 30 tablet 1  . cyclobenzaprine (FLEXERIL) 5 MG tablet Take 1 tablet (5 mg total) by mouth 2 (two) times daily as needed for muscle spasms. 30 tablet 0  . cycloSPORINE (RESTASIS) 0.05 % ophthalmic emulsion Place 1 drop into both eyes 2 (two) times daily as needed (dry eyes).     Marland Kitchen diclofenac sodium (VOLTAREN) 1 % GEL APPLY 2 GRAMS EXTERNALLY TO THE AFFECTED AREA FOUR TIMES DAILY (Patient taking differently: Apply 2 g topically 4 (four) times daily as needed (arthritis pain - knees, shoulder and thighs). ) 100 g 0  . diltiazem (CARDIZEM CD) 120 MG 24 hr capsule Take 1 capsule (120 mg total) by mouth daily. Please schedule appt for future refills. 3rd attempt. 15 capsule 0  . famotidine (PEPCID) 20 MG tablet Take 1 tablet (20 mg total) by mouth daily. 90 tablet 0  . FEROSUL 325 (65 Fe) MG tablet TAKE 1 TABLET(325 MG) BY MOUTH DAILY WITH BREAKFAST (Patient not taking: No sig reported) 30 tablet 0  . flecainide (TAMBOCOR) 100 MG tablet Take 1 tablet (100 mg total) by mouth 2 (two) times daily. Please make overdue appt with Dr. Lovena Le before anymore refills. 2nd attempt 30 tablet 0  . fluticasone (FLONASE) 50  MCG/ACT nasal spray Place 2 sprays into both nostrils daily.     . furosemide (LASIX) 40 MG tablet TAKE 1 AND 1/2 TABLETS(60 MG) BY MOUTH DAILY 135 tablet 0  . gabapentin (NEURONTIN) 100 MG capsule Take 2 capsules (200 mg total) by mouth 3 (three) times daily. 180 capsule 1  . hydrOXYzine (VISTARIL) 25 MG capsule TAKE 1 CAPSULE(25 MG) BY MOUTH DAILY AS NEEDED FOR ANXIETY 30 capsule  0  . ipratropium-albuterol (DUONEB) 0.5-2.5 (3) MG/3ML SOLN Take 3 mLs by nebulization every 6 (six) hours as needed (shortness of breath, wheezing). 360 mL 0  . Melatonin 3 MG TABS Take 1 tablet (3 mg total) by mouth at bedtime as needed (sleep). (Patient taking differently: Take 3 mg by mouth at bedtime. ) 30 tablet 0  . metoprolol tartrate (LOPRESSOR) 25 MG tablet Take 0.5 tablets (12.5 mg total) by mouth 2 (two) times daily. 90 tablet 0  . mometasone-formoterol (DULERA) 100-5 MCG/ACT AERO Inhale 2 puffs into the lungs 2 (two) times daily. 13 g 0  . montelukast (SINGULAIR) 10 MG tablet Take 1 tablet (10 mg total) by mouth at bedtime. 30 tablet 0  . omeprazole (PRILOSEC) 20 MG capsule Take 1 capsule (20 mg total) by mouth daily. 90 capsule 0  . OXYGEN Inhale 6 L into the lungs continuous.     . potassium chloride (KLOR-CON) 10 MEQ tablet TAKE 2 TABLETS(20 MEQ) BY MOUTH DAILY 180 tablet 0  . traMADol (ULTRAM) 50 MG tablet Take 50 mg by mouth 2 (two) times daily as needed for moderate pain.     Marland Kitchen umeclidinium bromide (INCRUSE ELLIPTA) 62.5 MCG/INH AEPB Inhale 1 puff into the lungs daily. 90 each 0  . venlafaxine (EFFEXOR) 37.5 MG tablet Take 1 tablet (37.5 mg total) by mouth daily with breakfast. Take with a 150 mg ER capsule every morning 90 tablet 0  . venlafaxine XR (EFFEXOR-XR) 150 MG 24 hr capsule Take 1 capsule (150 mg total) by mouth daily with breakfast. Take with a 37.5 mg tablet every morning 90 capsule 3  . VENTOLIN HFA 108 (90 Base) MCG/ACT inhaler INHALE 2 PUFFS INTO THE LUNGS EVERY 6 HOURS AS NEEDED FOR  WHEEZING OR SHORTNESS OF BREATH (Patient taking differently: Inhale 2 puffs into the lungs every 6 (six) hours as needed for wheezing or shortness of breath. INHALE 2 PUFFS INTO THE LUNGS EVERY 6 HOURS AS NEEDED FOR WHEEZING OR SHORTNESS OF BREATH) 18 g 0    Objective: BP (!) 140/94   Pulse (!) 135   Temp 99.5 F (37.5 C) (Oral)   Resp (!) 26   Ht 5\' 5"  (1.651 m)   SpO2 94%   BMI 58.22 kg/m  Exam: General: Patient in bed on 6L [place] asking for breathing treatment, obviously short of breath but able to talk and answer questions. Eyes: White sclera, EOM intact ENTM: Moist membranes Neck: No cervical lymphadenopathy noted, left sided JVD noted on exam Cardiovascular: Irregular rate irregular rhythm, no murmur detected, bilateral edema in lower extremities, 1+ pitting edema to the midshin and LLE, nonpitting in RLE JVD on left side of neck Respiratory: Increased work of breathing, poor expiration, upper airway wheezing noted on expiration, inspiratory crackles heard in all lobes, breathing on 6L [place], spoke in short sentences, significant DOE Gastrointestinal: Hypoactive bowel sounds, diffuse tenderness  patient felt she had gas MSK: 5/ 5 grip bilaterally, UT strength deferred due to patient having significant SOB, L LE 3/5 RLE 4/5 R dorsi- & plantarflexion 5/5, LLE dorsi- 3/5 L plantarflexion 5/5 Neuro: No CN deficits noted Psych: Appropriate mood and affect  Labs and Imaging: CBC BMET  Recent Labs  Lab 02/06/20 1444 02/06/20 1444 02/06/20 1454  WBC 8.6  --   --   HGB 11.9*   < > 13.3  HCT 40.4   < > 39.0  PLT 418*  --   --    < > = values in this  interval not displayed.   Recent Labs  Lab 02/06/20 1444 02/06/20 1444 02/06/20 1454  NA 140   < > 140  K 4.4   < > 4.4  CL 96*  --   --   CO2 33*  --   --   BUN 12  --   --   CREATININE 0.54  --   --   GLUCOSE 94  --   --   CALCIUM 8.5*  --   --    < > = values in this interval not displayed.     EKG:  02/06/2020- A fib w/  elevated rate  CTA chest IMPRESSION: 1. No CT evidence of central pulmonary artery embolus. 2. Bilateral consolidative changes which may represent atelectasis or pneumonia. Clinical correlation is recommended.  Freida Busman, MD 02/06/2020, 7:57 PM PGY-1, [place] Intern pager: [phone], text pages welcome

## 2020-02-06 NOTE — ED Triage Notes (Signed)
PT arrived from home via EMS reporting sob, fatigue, body aches and headache since Monday. Pt reports CNA that comes to her home has been feeling sick since Monday with covid symptoms.

## 2020-02-06 NOTE — ED Provider Notes (Signed)
[facility] EMERGENCY DEPARTMENT Provider Note   CSN: [id number] Arrival date & time: 02/06/20  1258     History Chief Complaint  Patient presents with  . Shortness of Breath    [name] White is a 64 y.o. female.  HPI   Patient presents to the ED via EMS for multiple complaints.  Patient reports she has been feeling ill since Monday with shortness of breath, myalgias, arthralgias, headache and chills.  Patient also believes she has been running a fever, she reports T-max 99.8 but likely warmer than that at home.  She does have a history of acute on chronic respiratory failure and wears home O2 therapy.  History of CHF as well on diuretics. Denies urinary symptoms. No nausea/vomiting/diarrhea. Compliant with her home medications. Denies any obvious sick contacts. Denies chest pain but tightness with breathing.      Past Medical History:  Diagnosis Date  . Abdominal wall hematoma 03/06/2019  . Acute bronchitis 08/29/2018  . Acute on chronic respiratory failure with hypoxia ([place]) 08/29/2018  . Anxiety   . Asthma   . Chronic lower back pain   . COPD (chronic obstructive pulmonary disease) ([place])   . Depression   . Exposure to COVID-19 virus 11/02/2018  . Fall 02/2019  . Family history of anesthesia complication    "daughter; causes her to pass out afterwards"  . GERD (gastroesophageal reflux disease)   . History of atrial flutter 06/26/2015  . History of pulmonary embolus (PE) 11/03/2014  . Hypertension   . Hyperthyroidism   . Left medial tibial stress syndrome 12/26/2017  . Lower GI bleed   . Migraine    "monthly" (12/28/2013)  . Non-traumatic rhabdomyolysis 11/03/2014  . Obesity hypoventilation syndrome ([place]) 03/06/2019  . Osteoarthritis    "both knees; back of my neck; right pelvic bone" (12/28/2013)  . Paroxysmal A-fib ([place])   . Pulmonary embolism ([place]) 12/28/2013   "2 clots in each lung"    Patient Active Problem List   Diagnosis Date Noted  . SOB (shortness of  breath) 11/24/2019  . Respiratory distress   . Respiratory failure, acute-on-chronic ([place]) 11/09/2019  . Seasonal allergies 10/03/2019  . Debility 08/02/2019  . Hypertension associated with diabetes ([place])   . Weight gain due to medication 06/05/2019  . Acute on chronic heart failure with preserved ejection fraction ([place]) 03/14/2019  . Acquired thrombophilia ([place]) due to A-Fib 03/07/2019  . Heart failure with preserved ejection fraction ([place]) 03/06/2019  . Obesity hypoventilation syndrome ([place]) 03/06/2019  . Lower GI bleed   . Urge incontinence 01/27/2019  . Shortness of breath 08/29/2018  . OSA (obstructive sleep apnea) 07/05/2018  . Respiratory failure ([place]) 07/05/2018  . Severe episode of recurrent major depressive disorder, without psychotic features ([place])   . MDD (major depressive disorder), recurrent episode, severe ([place]) 09/07/2015  . Atrial flutter ([place]) 07/29/2015  . Asthma 11/12/2014  . History of hypertension 11/05/2014  . Paroxysmal atrial fibrillation ([place]) 11/03/2014  . Chronic anticoagulation 11/03/2014  . Major depressive disorder 11/03/2014  . History of pulmonary embolus (PE) 11/03/2014  . MDD (major depressive disorder), recurrent severe, without psychosis ([place]) 03/05/2014  . Hot flashes 04/22/2011  . OVERACTIVE BLADDER 04/18/2008  . Osteoarthritis of both knees 04/18/2008  . Osteoarthritis involving multiple joints on both sides of body 09/14/2007  . Morbid obesity ([place]) 08/24/2006  . RESTLESS LEGS SYNDROME 08/24/2006  . HYPERTENSION, BENIGN SYSTEMIC 08/24/2006  . RHINITIS, ALLERGIC 08/24/2006  . GASTROESOPHAGEAL REFLUX, NO ESOPHAGITIS 08/24/2006  Past Surgical History:  Procedure Laterality Date  . ABDOMINAL HYSTERECTOMY    . APPENDECTOMY    . BREAST CYST EXCISION Right   . DILATION AND CURETTAGE OF UTERUS    . ELECTROPHYSIOLOGIC STUDY N/A 05/27/2015   Procedure: A-Flutter;  Surgeon: Evans Lance, MD;  Location: [facility];  Service:  Cardiovascular;  Laterality: N/A;  . EXCISIONAL HEMORRHOIDECTOMY    . NASAL SINUS SURGERY  2007  . THYROIDECTOMY, PARTIAL Right 2005  . TUBAL LIGATION    . WISDOM TOOTH EXTRACTION       OB History   No obstetric history on file.     Family History  Problem Relation Age of Onset  . Osteoarthritis Mother   . Asthma Mother   . Heart failure Mother   . Breast cancer Daughter     Social History   Tobacco Use  . Smoking status: Former Smoker    Packs/day: 0.25    Years: 20.00    Pack years: 5.00    Types: Cigarettes    Quit date: 07/17/1999    Years since quitting: 20.5  . Smokeless tobacco: Never Used  Vaping Use  . Vaping Use: Never used  Substance Use Topics  . Alcohol use: Not Currently    Comment: "drank some in my 30's"  . Drug use: No    Home Medications Prior to Admission medications   Medication Sig Start Date End Date Taking? Authorizing Provider  acetaminophen (TYLENOL) 325 MG tablet Take 2 tablets (650 mg total) by mouth every 6 (six) hours as needed for mild pain. 09/11/18   Georgette Shell, MD  apixaban (ELIQUIS) 5 MG TABS tablet Take 1 tablet (5 mg total) by mouth 2 (two) times daily. 12/31/19   Anderson, Chelsey L, DO  atorvastatin (LIPITOR) 40 MG tablet Take 1 tablet (40 mg total) by mouth daily. 12/31/19   Anderson, Chelsey L, DO  azelastine (ASTELIN) 0.1 % nasal spray Place 2 sprays into both nostrils 2 (two) times daily. Use in each nostril as directed Patient taking differently: Place 2 sprays into both nostrils daily.  06/01/19   Lattie Haw, MD  azelastine (OPTIVAR) 0.05 % ophthalmic solution INSTILL 1 DROP IN BOTH EYES TWICE DAILY AS NEEDED FOR ITCHY EYES 01/30/20   Anderson, Chelsey L, DO  cetirizine (ZYRTEC) 10 MG tablet Take 1 tablet (10 mg total) by mouth daily. 12/31/19   Anderson, Chelsey L, DO  cyclobenzaprine (FLEXERIL) 5 MG tablet Take 1 tablet (5 mg total) by mouth 2 (two) times daily as needed for muscle spasms. 02/04/20   Anderson, Chelsey L,  DO  cycloSPORINE (RESTASIS) 0.05 % ophthalmic emulsion Place 1 drop into both eyes 2 (two) times daily as needed (dry eyes).     [provider]  diclofenac sodium (VOLTAREN) 1 % GEL APPLY 2 GRAMS EXTERNALLY TO THE AFFECTED AREA FOUR TIMES DAILY Patient taking differently: Apply 2 g topically 4 (four) times daily as needed (arthritis pain - knees, shoulder and thighs).  07/31/18   [name], MD  diltiazem (CARDIZEM CD) 120 MG 24 hr capsule Take 1 capsule (120 mg total) by mouth daily. Please schedule appt for future refills. 3rd attempt. 01/30/20   Evans Lance, MD  famotidine (PEPCID) 20 MG tablet Take 1 tablet (20 mg total) by mouth daily. 06/05/19   Anderson, Chelsey L, DO  FEROSUL 325 (65 Fe) MG tablet TAKE 1 TABLET(325 MG) BY MOUTH DAILY WITH BREAKFAST Patient not taking: No sig reported 10/02/19  [name], MD  flecainide (TAMBOCOR) 100 MG tablet Take 1 tablet (100 mg total) by mouth 2 (two) times daily. Please make overdue appt with Dr. Lovena Le before anymore refills. 2nd attempt 12/31/19   Evans Lance, MD  fluticasone [facility]) 50 MCG/ACT nasal spray Place 2 sprays into both nostrils daily.  10/29/18   [provider]  furosemide (LASIX) 40 MG tablet TAKE 1 AND 1/2 TABLETS(60 MG) BY MOUTH DAILY 02/06/20   Anderson, Chelsey L, DO  gabapentin (NEURONTIN) 100 MG capsule Take 2 capsules (200 mg total) by mouth 3 (three) times daily. 12/31/19   Anderson, Chelsey L, DO  hydrOXYzine (VISTARIL) 25 MG capsule TAKE 1 CAPSULE(25 MG) BY MOUTH DAILY AS NEEDED FOR ANXIETY 01/03/20   Anderson, Chelsey L, DO  ipratropium-albuterol (DUONEB) 0.5-2.5 (3) MG/3ML SOLN Take 3 mLs by nebulization every 6 (six) hours as needed (shortness of breath, wheezing). 11/13/19   Wilber Oliphant, MD  Melatonin 3 MG TABS Take 1 tablet (3 mg total) by mouth at bedtime as needed (sleep). Patient taking differently: Take 3 mg by mouth at bedtime.  07/07/19   Brimage, Ronnette Juniper, DO  metoprolol tartrate (LOPRESSOR) 25  MG tablet Take 0.5 tablets (12.5 mg total) by mouth 2 (two) times daily. 12/08/19   [name], MD  mometasone-formoterol (DULERA) 100-5 MCG/ACT AERO Inhale 2 puffs into the lungs 2 (two) times daily. 06/05/19   Anderson, Chelsey L, DO  montelukast (SINGULAIR) 10 MG tablet Take 1 tablet (10 mg total) by mouth at bedtime. 08/12/19   [name], MD  omeprazole (PRILOSEC) 20 MG capsule Take 1 capsule (20 mg total) by mouth daily. 10/03/19   [name], MD  OXYGEN Inhale 6 L into the lungs continuous.     [provider]  potassium chloride (KLOR-CON) 10 MEQ tablet TAKE 2 TABLETS(20 MEQ) BY MOUTH DAILY 11/26/19   [name], MD  traMADol (ULTRAM) 50 MG tablet Take 50 mg by mouth 2 (two) times daily as needed for moderate pain.  09/24/19   [provider]  umeclidinium bromide (INCRUSE ELLIPTA) 62.5 MCG/INH AEPB Inhale 1 puff into the lungs daily. 09/25/19   [name], MD  venlafaxine [facility]) 37.5 MG tablet Take 1 tablet (37.5 mg total) by mouth daily with breakfast. Take with a 150 mg ER capsule every morning 11/22/19   [name], MD  venlafaxine XR (EFFEXOR-XR) 150 MG 24 hr capsule Take 1 capsule (150 mg total) by mouth daily with breakfast. Take with a 37.5 mg tablet every morning 12/16/19   [name], MD  VENTOLIN HFA 108 (90 Base) MCG/ACT inhaler INHALE 2 PUFFS INTO THE LUNGS EVERY 6 HOURS AS NEEDED FOR WHEEZING OR SHORTNESS OF BREATH Patient taking differently: Inhale 2 puffs into the lungs every 6 (six) hours as needed for wheezing or shortness of breath. INHALE 2 PUFFS INTO THE LUNGS EVERY 6 HOURS AS NEEDED FOR WHEEZING OR SHORTNESS OF BREATH 11/22/19   [name], MD    Allergies    Caffeine, Hydralazine hcl, Hydrocodone, Ciprofloxacin, Erythromycin, Lisinopril, Oxycodone, and Sulfamethoxazole-trimethoprim  Review of Systems   Review of Systems  Constitutional: Positive for fatigue and fever. Negative for chills.  HENT: Negative for ear pain  and sore throat.   Eyes: Negative for pain and visual disturbance.  Respiratory: Positive for cough and shortness of breath.   Cardiovascular: Negative for chest pain and palpitations.  Gastrointestinal: Negative for abdominal pain and vomiting.  Genitourinary: Negative for dysuria and hematuria.  Musculoskeletal: Positive for arthralgias  and myalgias. Negative for back pain.  Skin: Negative for color change and rash.  Neurological: Positive for weakness and headaches. Negative for seizures and syncope.  All other systems reviewed and are negative.   Physical Exam Updated Vital Signs BP (!) 140/94   Pulse (!) 135   Temp 99.5 F (37.5 C) (Oral)   Resp (!) 26   Ht 5\' 5"  (1.651 m)   SpO2 94%   BMI 58.22 kg/m   Physical Exam Vitals and nursing note reviewed.  Constitutional:      General: She is not in acute distress.    Appearance: She is well-developed. She is obese. She is ill-appearing. She is not toxic-appearing.  HENT:     Head: Normocephalic and atraumatic.  Eyes:     Extraocular Movements: Extraocular movements intact.     Conjunctiva/sclera: Conjunctivae normal.     Pupils: Pupils are equal, round, and reactive to light.  Cardiovascular:     Rate and Rhythm: Normal rate and regular rhythm.     Pulses: Normal pulses.     Heart sounds: No murmur heard.   Pulmonary:     Effort: Pulmonary effort is normal. Tachypnea present. No respiratory distress.     Breath sounds: Decreased air movement present. Rhonchi present. No wheezing.  Abdominal:     Palpations: Abdomen is soft.     Tenderness: There is no abdominal tenderness.  Musculoskeletal:     Cervical back: Neck supple.     Right lower leg: Edema present.     Left lower leg: Edema present.  Skin:    General: Skin is warm and dry.     Capillary Refill: Capillary refill takes less than 2 seconds.  Neurological:     Mental Status: She is alert and oriented to person, place, and time. Mental status is at baseline.       GCS: GCS eye subscore is 4. GCS verbal subscore is 5. GCS motor subscore is 6.     Motor: No weakness, abnormal muscle tone or seizure activity.  Psychiatric:        Mood and Affect: Mood normal.        Behavior: Behavior normal.     ED Results / Procedures / Treatments   Labs (all labs ordered are listed, but only abnormal results are displayed) Labs Reviewed  COMPREHENSIVE METABOLIC PANEL - Abnormal; Notable for the following components:      Result Value   Chloride 96 (*)    CO2 33 (*)    Calcium 8.5 (*)    Albumin 3.2 (*)    All other components within normal limits  CBC WITH DIFFERENTIAL/PLATELET - Abnormal; Notable for the following components:   Hemoglobin 11.9 (*)    MCHC 29.5 (*)    RDW 17.6 (*)    Platelets 418 (*)    All other components within normal limits  I-STAT VENOUS BLOOD GAS, ED - Abnormal; Notable for the following components:   pH, Ven 7.494 (*)    pO2, Ven 179.0 (*)    Bicarbonate 44.0 (*)    TCO2 46 (*)    Acid-Base Excess 18.0 (*)    Calcium, Ion 0.97 (*)    All other components within normal limits  SARS CORONAVIRUS 2 BY RT PCR (HOSPITAL ORDER, [facility])  CULTURE, BLOOD (ROUTINE X 2)  CULTURE, BLOOD (ROUTINE X 2)  URINE CULTURE  LACTIC ACID, PLASMA  PROTIME-INR  MAGNESIUM  LACTIC ACID, PLASMA  URINALYSIS, ROUTINE W  REFLEX MICROSCOPIC  BRAIN NATRIURETIC PEPTIDE    EKG EKG Interpretation  Date/Time:  Thursday February 06 2020 13:28:33 EDT Ventricular Rate:  132 PR Interval:    QRS Duration: 91 QT Interval:  346 QTC Calculation: 513 R Axis:   86 Text Interpretation: Atrial fibrillation Borderline right axis deviation Minimal ST depression, diffuse leads Prolonged QT interval When compared to prior, persistenet afib with fast rate. NO STEMI Confirmed by Antony Blackbird [phone]) on 02/06/2020 4:38:27 PM   Radiology DG Chest Port 1 View  Result Date: 02/06/2020 CLINICAL DATA:  Dyspnea, fatigue EXAM: PORTABLE  CHEST 1 VIEW COMPARISON:  Chest radiograph 11/24/2019, CT chest 08/31/2019 FINDINGS: Elevation of the right hemidiaphragm is unchanged. Minimal left basilar atelectasis. Lungs are otherwise clear. No pneumothorax or pleural effusion. Cardiac size within normal limits. Pulmonary vascularity is normal. No acute bone abnormality IMPRESSION: Stable elevation of the right hemidiaphragm. Minimal left basilar atelectasis. Otherwise no acute cardiopulmonary process. Electronically Signed   By: Fidela Salisbury MD   On: 02/06/2020 15:46    Procedures Procedures (including critical care time)  Medications Ordered in ED Medications  ipratropium-albuterol (DUONEB) 0.5-2.5 (3) MG/3ML nebulizer solution 3 mL (0 mLs Nebulization Hold 02/06/20 1750)  methylPREDNISolone sodium succinate (SOLU-MEDROL) 125 mg/2 mL injection 125 mg (125 mg Intravenous Given 02/06/20 1750)    ED Course   SHARNISE BLOUGH is a 64 y.o. female with PMHx listed that presents to the Emergency Department complaint of Shortness of Breath    ED Course: Initial exam completed.  Chronically ill-appearing and hemodynamically stable. Potentially toxic. Afebrile.  Physical exam significant for age appropriate 64yo female with obesity with tachypnea and decreased breath sounds on exam, mild lower extremity edema, distal perfusion intact, and abdomen soft/non-distended/non-tender.  Initial differential includes viral pneumonia, bacterial pneumonia, COPD exacerbation, CHF exacerbation, and various sources of infection including cystitis/bactermia/etc. Given these concerns, sepsis order set initiated. Given hemodynamically stable with history of CHF, will hold on volume resuscitation at this time to avoid volume overload.  Steroids and DuoNeb for respiratory support.   VBG with pH 7.49 and pCO2 57. Blood cultures pending x2. CMP without acute electrolyte abnormalities requiring urgent intervention. CBC without leukocytosis/leukopenia and stable  hemoglobin although slightly anemic 11.9. Magnesium normal. LA reassuring. COVID-19 negative. CXR without focal evidence of consolidation.   Although symptoms are consistent with viral infection, given her increased work of breathing and history of PE, would obtain CTPE to rule out given extensive co-morbidities and pulmonary history.   Diagnostics Vital Signs: reviewed Labs: reviewed and significant findings discussed above Imaging: personally reviewed images interpreted by radiology EKG: reviewed Records: nursing notes along with previous records reviewed and pertinent data discussed   Consults:  none   Reevaluation/Disposition:  Upon reevaluation, patients symptoms stable.  Care transitioned to oncoming provider with CTPE study and BNP pending on sign out.  Given her history, she will likely require inpatient management/observation for increased respiratory effort.   Sherolyn Buba, MD Emergency Medicine, PGY-3   Note: Dragon medical dictation software was used in the creation of this note.  Final Clinical Impression(s) / ED Diagnoses Final diagnoses:  SOB (shortness of breath)  Acute on chronic respiratory failure with hypoxia and hypercapnia [facility])    Rx / DC Orders ED Discharge Orders    None       Frann Rider, MD 02/06/20 1901    Lacretia Leigh, MD 02/07/20 1011

## 2020-02-07 DIAGNOSIS — J9601 Acute respiratory failure with hypoxia: Secondary | ICD-10-CM | POA: Diagnosis not present

## 2020-02-07 DIAGNOSIS — J9621 Acute and chronic respiratory failure with hypoxia: Secondary | ICD-10-CM | POA: Diagnosis not present

## 2020-02-07 DIAGNOSIS — J9622 Acute and chronic respiratory failure with hypercapnia: Secondary | ICD-10-CM

## 2020-02-07 DIAGNOSIS — J9611 Chronic respiratory failure with hypoxia: Secondary | ICD-10-CM | POA: Diagnosis not present

## 2020-02-07 LAB — COMPREHENSIVE METABOLIC PANEL
ALT: 21 U/L (ref 0–44)
AST: 23 U/L (ref 15–41)
Albumin: 3.5 g/dL (ref 3.5–5.0)
Alkaline Phosphatase: 100 U/L (ref 38–126)
Anion gap: 12 (ref 5–15)
BUN: 10 mg/dL (ref 8–23)
CO2: 32 mmol/L (ref 22–32)
Calcium: 8.7 mg/dL — ABNORMAL LOW (ref 8.9–10.3)
Chloride: 96 mmol/L — ABNORMAL LOW (ref 98–111)
Creatinine, Ser: 0.62 mg/dL (ref 0.44–1.00)
GFR calc Af Amer: >60 mL/min (ref >60)
GFR calc non Af Amer: >60 mL/min (ref >60)
Glucose, Bld: 191 mg/dL — ABNORMAL HIGH (ref 70–99)
Potassium: 3.9 mmol/L (ref 3.5–5.1)
Sodium: 140 mmol/L (ref 135–145)
Total Bilirubin: 0.6 mg/dL (ref 0.3–1.2)
Total Protein: 8.4 g/dL — ABNORMAL HIGH (ref 6.5–8.1)

## 2020-02-07 LAB — CBC
HCT: 43.3 % (ref 36.0–46.0)
Hemoglobin: 12.8 g/dL (ref 12.0–15.0)
MCH: 28.7 pg (ref 26.0–34.0)
MCHC: 29.6 g/dL — ABNORMAL LOW (ref 30.0–36.0)
MCV: 97.1 fL (ref 80.0–100.0)
Platelets: 492 10*3/uL — ABNORMAL HIGH (ref 150–400)
RBC: 4.46 MIL/uL (ref 3.87–5.11)
RDW: 17.5 % — ABNORMAL HIGH (ref 11.5–15.5)
WBC: 10.1 10*3/uL (ref 4.0–10.5)
nRBC: 0 % (ref 0.0–0.2)

## 2020-02-07 LAB — PROTIME-INR
INR: 1 (ref 0.8–1.2)
Prothrombin Time: 12.7 seconds (ref 11.4–15.2)

## 2020-02-07 LAB — BRAIN NATRIURETIC PEPTIDE: B Natriuretic Peptide: 91.9 pg/mL (ref 0.0–100.0)

## 2020-02-07 LAB — HEMOGLOBIN A1C
Hgb A1c MFr Bld: 6.4 % — ABNORMAL HIGH (ref 4.8–5.6)
Mean Plasma Glucose: 136.98 mg/dL

## 2020-02-07 LAB — LACTIC ACID, PLASMA: Lactic Acid, Venous: 2.4 mmol/L (ref 0.5–1.9)

## 2020-02-07 MED ORDER — HYDROXYZINE HCL 25 MG PO TABS
25.0000 mg | ORAL_TABLET | Freq: Three times a day (TID) | ORAL | Status: DC
  Administered 2020-02-07 – 2020-02-08 (×5): 25 mg via ORAL
  Filled 2020-02-07 (×5): qty 1

## 2020-02-07 MED ORDER — DOXYCYCLINE HYCLATE 100 MG PO TABS
100.0000 mg | ORAL_TABLET | Freq: Two times a day (BID) | ORAL | Status: DC
  Administered 2020-02-07 – 2020-02-08 (×3): 100 mg via ORAL
  Filled 2020-02-07 (×3): qty 1

## 2020-02-07 MED ORDER — GUAIFENESIN ER 600 MG PO TB12
600.0000 mg | ORAL_TABLET | Freq: Two times a day (BID) | ORAL | Status: DC | PRN
  Administered 2020-02-07 (×2): 600 mg via ORAL
  Filled 2020-02-07 (×2): qty 1

## 2020-02-07 MED ORDER — OLOPATADINE HCL 0.1 % OP SOLN
1.0000 [drp] | Freq: Two times a day (BID) | OPHTHALMIC | Status: DC
  Administered 2020-02-08: 1 [drp] via OPHTHALMIC
  Filled 2020-02-07: qty 5

## 2020-02-07 NOTE — Evaluation (Signed)
Physical Therapy Evaluation Patient Details Name: [name] White MRN: [id number] DOB: 1955-11-25 Today's Date: 02/07/2020   History of Present Illness  64 y.o. female presenting with SOB, HA, myalgias, arthralgias, and chills. PMH is significant for Paroxysmal A fib, respiratory failure chronically on 6 L [place], OSA. HFpEF , T2DM, MDD, HTN, HLD,  PE, and GERD. Pt with CTA showing bilateral consolidative changes consistent with atelectasis or pneumonia.  Clinical Impression  Pt presents to PT with deficits in activity tolerance, strength, power, gait, balance, and functional mobility. Pt is significantly limited by cardiopulmonary deficits, refusing ambulation away from the recliner due to fatigue. Pt with one mild LOB during marching. Pt will benefit from continued acute PT POC to aide in improving activity tolerance and reducing falls risk during household mobility. PT discussed the possibility of SNF rehab and pt declined, concerned about risk of COVID exposure. Pt electing to return home with HHPT services and continued PRN assistance from her niece.    Follow Up Recommendations Home health PT;Supervision/Assistance - 24 hour (pt refusing SNF placement)    Equipment Recommendations  None recommended by PT    Recommendations for Other Services       Precautions / Restrictions Precautions Precautions: Fall Precaution Comments: monitor HR and SpO2 Restrictions Weight Bearing Restrictions: No      Mobility  Bed Mobility               General bed mobility comments: pt received in recliner, declines return to bed as the recliner is more comfortable and allows her to breathe better  Transfers Overall transfer level: Needs assistance Equipment used: Rolling walker (2 wheeled) Transfers: Sit to/from Stand Sit to Stand: Min assist (minA progressing to minG)            Ambulation/Gait Ambulation/Gait assistance: Min guard Gait Distance (Feet): 1 Feet (pt marches in place for  6 steps) Assistive device: Rolling walker (2 wheeled) Gait Pattern/deviations: Step-to pattern Gait velocity: reduced Gait velocity interpretation: <1.31 ft/sec, indicative of household ambulator General Gait Details: pt with one mild posterior LOB requiring minG to maintain safety  Stairs            Wheelchair Mobility    Modified Rankin (Stroke Patients Only)       Balance Overall balance assessment: Needs assistance Sitting-balance support: No upper extremity supported;Feet supported Sitting balance-Leahy Scale: Fair     Standing balance support: Bilateral upper extremity supported Standing balance-Leahy Scale: Poor Standing balance comment: reliant on UE support                             Pertinent Vitals/Pain Pain Assessment: Faces Faces Pain Scale: Hurts even more Pain Location: knees Pain Descriptors / Indicators: Aching Pain Intervention(s): Monitored during session    Home Living Family/patient expects to be discharged to:: Private residence Living Arrangements: Other relatives (niece) Available Help at Discharge: Family;Available PRN/intermittently;Other (Comment) (niece works 8-4) Type of Home: Apartment Home Access: Stairs to enter Entrance Stairs-Rails: None Entrance Stairs-Number of Steps: 1 Home Layout: One level Home Equipment: Environmental consultant - 2 wheels;Walker - 4 wheels;Cane - single point;Wheelchair - [employer];Tub bench;Hospital bed Additional Comments: lives with niece, thinks her HHA almost exposed her to covid    Prior Function Level of Independence: Needs assistance   Gait / Transfers Assistance Needed: pt ambulates very short household distances, Advertising account planner for support  ADL's / Homemaking Assistance Needed: niece performs all IADLs and assists with bathing  Hand Dominance   Dominant Hand: Right    Extremity/Trunk Assessment   Upper Extremity Assessment Upper Extremity Assessment: Overall WFL for  tasks assessed    Lower Extremity Assessment Lower Extremity Assessment: Generalized weakness    Cervical / Trunk Assessment Cervical / Trunk Assessment: Other exceptions Cervical / Trunk Exceptions: morbid obesity  Communication   Communication: No difficulties  Cognition Arousal/Alertness: Awake/alert Behavior During Therapy: WFL for tasks assessed/performed Overall Cognitive Status: Within Functional Limits for tasks assessed                                        General Comments General comments (skin integrity, edema, etc.): pt on 6LNC, SpO2 drops to 88% with activity, HR elevates to 146 with activity. Pt fatigues quickly    Exercises     Assessment/Plan    PT Assessment Patient needs continued PT services  PT Problem List Decreased strength;Decreased activity tolerance;Decreased balance;Decreased mobility;Decreased knowledge of use of DME;Decreased safety awareness;Decreased knowledge of precautions;Cardiopulmonary status limiting activity;Pain       PT Treatment Interventions DME instruction;Gait training;Stair training;Functional mobility training;Therapeutic activities;Therapeutic exercise;Balance training;Neuromuscular re-education;Patient/family education    PT Goals (Current goals can be found in the Care Plan section)  Acute Rehab PT Goals Patient Stated Goal: To go home and continue improving leg strength PT Goal Formulation: With patient Time For Goal Achievement: 02/21/20 Potential to Achieve Goals: Good    Frequency Min 2X/week   Barriers to discharge        Co-evaluation               AM-PAC PT "6 Clicks" Mobility  Outcome Measure Help needed turning from your back to your side while in a flat bed without using bedrails?: A Lot Help needed moving from lying on your back to sitting on the side of a flat bed without using bedrails?: A Lot Help needed moving to and from a bed to a chair (including a wheelchair)?: A Little Help  needed standing up from a chair using your arms (e.g., wheelchair or bedside chair)?: A Little Help needed to walk in hospital room?: A Little Help needed climbing 3-5 steps with a railing? : A Lot 6 Click Score: 15    End of Session Equipment Utilized During Treatment: Oxygen Activity Tolerance: Patient limited by fatigue Patient left: in chair;with call bell/phone within reach Nurse Communication: Mobility status PT Visit Diagnosis: Other abnormalities of gait and mobility (R26.89);Unsteadiness on feet (R26.81);Muscle weakness (generalized) (M62.81)    Time: 9509-3267 PT Time Calculation (min) (ACUTE ONLY): 18 min   Charges:   PT Evaluation $PT Eval Moderate Complexity: 1 Mod          Zenaida Niece, PT, DPT Acute Rehabilitation Pager: [phone]   Zenaida Niece 02/07/2020, 5:38 PM

## 2020-02-07 NOTE — TOC Initial Note (Signed)
Transition of Care [facility]) - Initial/Assessment Note    Patient Details  Name: [name] White MRN: [id number] Date of Birth: 27-Oct-1955  Transition of Care [facility]) CM/SW Contact:    Curlene Labrum, RN Phone Number: 02/07/2020, 4:16 PM  Clinical Narrative:                 Case management met with the patient regarding patient's admission for pneumonia and possible discharge home tomorrow.  The patient lives with her niece at the house and was receiving personal care aide assistance from [facility].  The patient states that the aide may have had COVID and would not like the services to return but would like [facility] services with [place] again.  I called Hoyle Sauer with [facility] and home health to start with PT/aide.  The patient has NIV and dme including RW, WC, rollator, BSC and hospital bed from [employer].  I called [facility] and they are checking on [facility] Pt/aide.  Expected Discharge Plan: [place] Barriers to Discharge: Continued Medical Work up   Patient Goals and CMS Choice Patient states their goals for this hospitalization and ongoing recovery are:: Patient plans to discharge home with home health CMS Medicare.gov Compare Post Acute Care list provided to:: Patient Choice offered to / list presented to : Patient  Expected Discharge Plan and Services Expected Discharge Plan: [place]   Discharge Planning Services: CM Consult Post Acute Care Choice: [place] arrangements for the past 2 months: Apartment                           HH Arranged: PT, Nurse's Aide          Prior Living Arrangements/Services Living arrangements for the past 2 months: Apartment Lives with:: Relatives Patient language and need for interpreter reviewed:: Yes Do you feel safe going back to the place where you live?: Yes      Need for Family Participation in Patient Care: Yes (Comment) Care giver support system in place?: Yes (comment)   Criminal  Activity/Legal Involvement Pertinent to Current Situation/Hospitalization: No - Comment as needed  Activities of Daily Living Home Assistive Devices/Equipment: BIPAP, Nebulizer, Oxygen, Shower chair with back, Walker (specify type) ADL Screening (condition at time of admission) Patient's cognitive ability adequate to safely complete daily activities?: Yes Is the patient deaf or have difficulty hearing?: No Does the patient have difficulty seeing, even when wearing glasses/contacts?: No Does the patient have difficulty concentrating, remembering, or making decisions?: No Patient able to express need for assistance with ADLs?: Yes Does the patient have difficulty dressing or bathing?: Yes Independently performs ADLs?: No Does the patient have difficulty walking or climbing stairs?: Yes Weakness of Legs: Both Weakness of Arms/Hands: None  Permission Sought/Granted Permission sought to share information with : Case Manager       Permission granted to share info w AGENCY: [place] granted to share info w Relationship: family     Emotional Assessment Appearance:: Appears stated age Attitude/Demeanor/Rapport: Gracious Affect (typically observed): Accepting Orientation: : Oriented to Self, Oriented to Place, Oriented to  Time, Oriented to Situation Alcohol / Substance Use: Not Applicable Psych Involvement: No (comment)  Admission diagnosis:  SOB (shortness of breath) [R06.02] Acute respiratory failure with hypoxia (HCC) [J96.01] Atrial fibrillation with RVR (HCC) [I48.91] Acute on chronic respiratory failure with hypoxia and hypercapnia (HCC) [J96.21, J96.22] Community acquired pneumonia, unspecified laterality [J18.9] Patient Active  Problem List   Diagnosis Date Noted  . Acute respiratory failure with hypoxia ([place]) 02/06/2020  . SOB (shortness of breath) 11/24/2019  . Respiratory distress   . Respiratory failure, acute-on-chronic ([place]) 11/09/2019  . Seasonal  allergies 10/03/2019  . Debility 08/02/2019  . Hypertension associated with diabetes ([place])   . Weight gain due to medication 06/05/2019  . Acute on chronic heart failure with preserved ejection fraction ([place]) 03/14/2019  . Acquired thrombophilia ([place]) due to A-Fib 03/07/2019  . Heart failure with preserved ejection fraction ([place]) 03/06/2019  . Obesity hypoventilation syndrome ([place]) 03/06/2019  . Lower GI bleed   . Urge incontinence 01/27/2019  . Shortness of breath 08/29/2018  . OSA (obstructive sleep apnea) 07/05/2018  . Respiratory failure ([place]) 07/05/2018  . Severe episode of recurrent major depressive disorder, without psychotic features ([place])   . MDD (major depressive disorder), recurrent episode, severe ([place]) 09/07/2015  . Atrial flutter ([place]) 07/29/2015  . Asthma 11/12/2014  . History of hypertension 11/05/2014  . Paroxysmal atrial fibrillation ([place]) 11/03/2014  . Chronic anticoagulation 11/03/2014  . Major depressive disorder 11/03/2014  . History of pulmonary embolus (PE) 11/03/2014  . MDD (major depressive disorder), recurrent severe, without psychosis ([place]) 03/05/2014  . Hot flashes 04/22/2011  . OVERACTIVE BLADDER 04/18/2008  . Osteoarthritis of both knees 04/18/2008  . Osteoarthritis involving multiple joints on both sides of body 09/14/2007  . Morbid obesity ([place]) 08/24/2006  . RESTLESS LEGS SYNDROME 08/24/2006  . HYPERTENSION, BENIGN SYSTEMIC 08/24/2006  . RHINITIS, ALLERGIC 08/24/2006  . GASTROESOPHAGEAL REFLUX, NO ESOPHAGITIS 08/24/2006   PCP:  Richarda Osmond, DO Pharmacy:   [facility] [place], [address] AT [address] [place] [address] Phone: [phone] Fax: [phone]     Social Determinants of Health (SDOH) Interventions    Readmission Risk Interventions Readmission Risk Prevention Plan 02/07/2020 07/22/2019 03/08/2019  Transportation Screening Complete  Complete Complete  Medication Review Press photographer) Complete Complete Complete  PCP or Specialist appointment within 3-5 days of discharge Complete Complete Complete  HRI or Home Care Consult Complete Complete Complete  SW Recovery Care/Counseling Consult Complete Complete Complete  Palliative Care Screening Complete Not Applicable Not Applicable  Skilled Nursing Facility Complete Not Applicable Not Applicable  Some recent data might be hidden

## 2020-02-07 NOTE — Progress Notes (Addendum)
Family Medicine Teaching Service Daily Progress Note Intern Pager: [phone]  Patient name: [name] White Medical record number: [id number] Date of birth: Aug 28, 1955 Age: 64 y.o. Gender: female  Primary Care Provider: Richarda Osmond, DO Consultants: None Code Status: DNR  Pt Overview and Major Events to Date:  Admitted 02/06/20  Assessment and Plan: [name] White is a 64 y.o. female presenting with SOB, HA, myalgias, arthralgias, and chills. PMH is significant for Paroxysmal A fib, respiratory failure chronically on 6 L [place], OSA. HFpEF , T2DM, MDD, HTN, HLD,  PE, and GERD.  Acute on chronic respiratory failure with hypoxia Patient reports recently feeling more short of breath than usual.  Home health care nurse recently tested positive for COVID however patient is vaccinated.  COVID test in the ED was negative for patient.  Physical exam on admission patient noted to have significant SOB and upper airway wheezing with inspiratory crackles in all lobes.  CTA chest PE and admission found no CT evidence of central pulmonary artery embolus there was evidence of bilateral consolidative changes which may represent atelectasis or pneumonia. WBC within normal limits, and pt remains afebrile. Pt has frequent admissions for respiratory failure which are likely attributed to her OHS and OSA.  ED started patient on azithromycin and ceftriaxone as well as steroid bolus. Currently satting 93% on 6 L [place] which is her baseline. Pt actively coughing on exam today. Crackles heard diffusely  -Admit to [facility], FPTS, Dr. Ardelia Mems -Continuous pulse ox - [place] w/ goal 90-94% - duoneb q6h prn - Pt to take doxy for concern of possible pneumonia 100mg  twice daily for the next 6 days (through 8/18) - Discontinue steroids at this time - Continue home medications - BiPAP QHS - PT consult   Atrial fibrillation Patient has paroxysmal atrial fibrillation at baseline.  EKG noted A. fib with increased rate in the  130-40s range.   ED ordered her home medications including: Diltiazem, flecainide, metoprolol tartrate, and apixaban. Expect her BP and HR to improve with administration of home medications. Mg 1.8. K 4.4.  PT INR at admission 11.9 and 0.9, respectively. -Continue home medications -Continue telemetry  HFpEF Patient has a history of heart failure most recent echo had EF of 60 to 65% with mildly dilated left atrium but severely hypertrophied left ventricle.  -Continue home medication: Furosemide 60 daily -Low-sodium diet with 1500 mL fluid restriction -Monitor I's/O  GERD Patient has a history of GERD and takes famotidine and protonix at home. -Continue home medication.  Hospital formulary does not contain omeprazole patient transitioned to pantoprazole inpatient.  Prediabetes Patient has a history of A1c between 6-6.5 with one value of 7.8. Current A1C 6.4. CBG at presentation was 94.  Patient has no home medications as it is diet controlled. -We will continue to monitor -Continue control with meals -f/u HgbA1c  Hx of PE  Pt has a hx of PE and complains of chronic leg swelling/pain, left greater than right.  todays exam is consistent with previous admissions.  CTA did not show evidence of PE.  - continue eliquis.   HTN Patient has a history of hypertension at baseline.  BP ranged 137/101-173/156 on presentation. Pt had not taken medications today. Will monitor for improvement after addition of home medications.  Home medications include diltiazem and metoprolol. -Continue home medications  HLD Patient has a history of HLD.  Her medications include Lipitor.  -Continue home medications   RLS Patient has a history of restless leg syndrome.  Takes, gabapentin,Voltaren gel, and Tylenol at home. -Continue home medications.  MDD Patient has a history of MDD, home meds include venlafaxine XR and venlafaxine and hydroxyzine. -Continue home medications  FEN/GI: Low-sodium with  1500 mL fluid restriction Prophylaxis: Home Eliquis  Disposition: Med telemetry. Possible discharge tomorrow   Subjective:   No acute events overnight. Patient states she could not sleep last night because breathing is difficult. Patient is very frustrated that she was exposed to Covid from her home health aide who was not vaccinated. Pt expressed gratitude to Dr. Kris Mouton for encouraging her to get the vaccine. She was very relieved that she tested negative for Covid and knows that it is something that could have really impacted her negatively. She states it has been a very hard and she is anxious and requesting Ativan. She also requests either cough drops or cough syrup and Voltaren gel for her arthritis.  Objective: Temp:  [97.7 F (36.5 C)-99.5 F (37.5 C)] 98 F (36.7 C) (08/13 0730) Pulse Rate:  [73-135] 73 (08/13 0730) Resp:  [17-28] 17 (08/13 0730) BP: (126-173)/(65-156) 138/65 (08/13 0730) SpO2:  [92 %-99 %] 93 % (08/13 0730) Physical Exam: General: sitting in bed on 6L [name], in no acute distress. Coughing but able to talk Cardiovascular: Irregular rate, irregular rhythm. No murmur detected  Respiratory: diffuse crackles. Pt actively coughing on deep inhalation.   Abdomen: soft, non- distended  Extremities: warm, dry. 1+ pitting edema bilaterally.   Laboratory: Recent Labs  Lab 02/06/20 1444 02/06/20 1454 02/07/20 0056  WBC 8.6  --  10.1  HGB 11.9* 13.3 12.8  HCT 40.4 39.0 43.3  PLT 418*  --  492*   Recent Labs  Lab 02/06/20 1444 02/06/20 1454 02/07/20 0056  NA 140 140 140  K 4.4 4.4 3.9  CL 96*  --  96*  CO2 33*  --  32  BUN 12  --  10  CREATININE 0.54  --  0.62  CALCIUM 8.5*  --  8.7*  PROT 7.1  --  8.4*  BILITOT 1.0  --  0.6  ALKPHOS 90  --  100  ALT 20  --  21  AST 35  --  23  GLUCOSE 94  --  191*     Imaging/Diagnostic Tests:  CT Angio Chest PE W and/or Wo Contrast 1. No CT evidence of central pulmonary artery embolus. 2. Bilateral  consolidative changes which may represent atelectasis or pneumonia. Clinical correlation is recommended.  DG Chest Port 1 View Stable elevation of the right hemidiaphragm. Minimal left basilar atelectasis. Otherwise no acute cardiopulmonary process.    Theone Stanley, DO 02/07/2020, 7:42 AM PGY-1, [place] Intern pager: [phone], text pages welcome

## 2020-02-07 NOTE — Progress Notes (Signed)
Pt wanted to change setting to higher so she could feel the pressure.  [id number] was suitable for patient with 5 lpm bled in.

## 2020-02-07 NOTE — Consult Note (Signed)
   [facility] CM Inpatient Consult   02/07/2020  [name] White 03/29/1956 [id number]   [facility] [[facility]]  Accountable Care Organization [ACO] Patient: [employer]  Patient was screened for [facility] [[facility]]  Care Management services. Patient will have the transition of care call conducted by the primary care provider. This patient is also in an Embedded practice which has a chronic disease management Embedded Care Management team. Met with the patient at the bedside.  She endorses her chronic care coordinator at the practice as, " Traci".  She also endorses she was receiving personal care services but she verbalized concerns to this writer and the inpatient Transition of Care [TOC] RN. She states she does want ongoing care management at the Embedded practice.  Patient states, "I am so glad I listened to Dr. Fletcher about the vaccine, I received my second dose on July 22nd."  Patient endorses home health for transition.  Plan: Notification to be sent to the [facility] Family Medicine Embedded Care Management and made aware of above needs.   Please contact for further questions,   , RN BSN CCM [facility] Hospital Liaison  [phone] business mobile phone Toll free office [phone]  Fax number: [phone] [email or web address]   

## 2020-02-07 NOTE — Plan of Care (Signed)

## 2020-02-07 NOTE — Care Management Obs Status (Signed)
[place] NOTIFICATION   Patient Details  Name: ADRIANN THAU MRN: [id number] Date of Birth: [birth year]   Medicare Observation Status Notification Given:  Ernesta Amble, RN 02/07/2020, 10:11 PM

## 2020-02-07 NOTE — Progress Notes (Signed)
Interim progress note  Visited patient in her room with Dr. Jordan Hawks page.  Patient seen resting upright in her recliner with legs elevated.  Had just initial breathing treatment.  Reports that she continues to have some cough, also reports some nasal drainage as she was having at home as well as eye crustiness.  Reports however that she feels well enough to go home this afternoon.  From a logistical standpoint, the patient reports that going home this afternoon would be difficult as the person she lives with will be at work until about 5-6:00.  She would prefer to wait to go home until tomorrow 8/14 at 5-6:00.  Additionally, patient CTA showing bilateral consolidative changes consistent with atelectasis or pneumonia.  Patient's vitals stable, she is back on her home O2 of 6 L nasal cannula (satting well on 5 L nasal cannula here in the hospital), has been afebrile since hospitalization.  However, due to patient's past medical history, recent symptomatology (frequent coughing, nasal drainage, subjective chills, as well as a high likelihood of bouncing back) will have to treat patient with 7 days total of antibiotics.  Patient has already received 1 day of azithromycin.  We will start patient on doxycycline 100 mg twice daily for the next 6 days (through 8/18).  We will reach out to case management to prep for patient's discharge tomorrow. We will continue to reassess the patient's condition and address her needs,   Milus Banister, [place], PGY-3 02/07/2020 3:50 PM

## 2020-02-07 NOTE — Progress Notes (Addendum)
Pt noted to have frequent requests since admission, Pt requests for a female tech. requests for a purewick put on first before a Bipap, was encourage to use [facility] but requests a Purewick instead and will let us know if she needs to use BSC. Bipap was eventually put on by RT however pt stated "It's not working well". Called RT several times for them to  explain how Bipap works to pt however pt says it's not doing right. RT came into room and change settings to continuous pressure and pt was ok with it. She is educated regarding 1500 ml fluid restriction, voiced understanding however  still frequently asks for something to drink, she had 3 cups of water and a cup of coffee since admission. Continues to have a dry/congested cough with audible wheezing, PRN Breathing tx was administered with slight effect per patient. Intermittently sits at the side of the bed so she can breathe better. Breathing is labored while talking and during movement, O2 @ 6LPM via [place] in use with O2 saturation bet 95-98%. Will cont. To monitor.

## 2020-02-07 NOTE — Care Management CC44 (Signed)
Condition Code 44 Documentation Completed  Patient Details  Name: [name] White MRN: [id number] Date of Birth: 13-May-1956   Condition Code 44 given:  Yes Patient signature on Condition Code 44 notice:  Yes Documentation of 2 MD's agreement:  Yes Code 44 added to claim:  Yes    Laurena Slimmer, RN 02/07/2020, 10:11 PM

## 2020-02-07 NOTE — Progress Notes (Signed)
Pt. Refused cpap for tonight. 

## 2020-02-08 DIAGNOSIS — M2550 Pain in unspecified joint: Secondary | ICD-10-CM | POA: Diagnosis not present

## 2020-02-08 DIAGNOSIS — J189 Pneumonia, unspecified organism: Secondary | ICD-10-CM

## 2020-02-08 DIAGNOSIS — F29 Unspecified psychosis not due to a substance or known physiological condition: Secondary | ICD-10-CM | POA: Diagnosis not present

## 2020-02-08 DIAGNOSIS — Z7401 Bed confinement status: Secondary | ICD-10-CM | POA: Diagnosis not present

## 2020-02-08 DIAGNOSIS — J9622 Acute and chronic respiratory failure with hypercapnia: Secondary | ICD-10-CM | POA: Diagnosis not present

## 2020-02-08 DIAGNOSIS — R5381 Other malaise: Secondary | ICD-10-CM | POA: Diagnosis not present

## 2020-02-08 DIAGNOSIS — J9621 Acute and chronic respiratory failure with hypoxia: Secondary | ICD-10-CM | POA: Diagnosis not present

## 2020-02-08 DIAGNOSIS — J9601 Acute respiratory failure with hypoxia: Secondary | ICD-10-CM | POA: Diagnosis not present

## 2020-02-08 MED ORDER — GUAIFENESIN ER 600 MG PO TB12: 600.0000 mg | ORAL_TABLET | Freq: Two times a day (BID) | ORAL | 0 refills | Status: DC | PRN

## 2020-02-08 MED ORDER — DOXYCYCLINE HYCLATE 100 MG PO TABS: 100.0000 mg | ORAL_TABLET | Freq: Two times a day (BID) | ORAL | 0 refills | Status: AC

## 2020-02-08 NOTE — Plan of Care (Signed)

## 2020-02-08 NOTE — Discharge Summary (Signed)
[facility] Discharge Summary  Patient name: [name] White Medical record number: [id number] Date of birth: [birth year] Age: 64 y.o. Gender: female Date of Admission: 02/06/2020  Date of Discharge: 02/08/20 Admitting Physician: [name] Resides, MD  Primary Care Provider: Richarda Osmond, DO Consultants: None  Indication for Hospitalization: Community-acquired pneumonia and volume overload  Discharge Diagnoses/Problem List:  Active Problems:   Acute respiratory failure with hypoxia ([place])   Community acquired pneumonia  Disposition: Discharge home  Discharge Condition: Stable and improved  Discharge Exam:  General: Alert.  Sitting up in chair. Cardiovascular: Decreased heart sounds.  Regular rate and rhythm. Respiratory: No wheezing.  Diffuse inspiratory crackles heard bilaterally Abdomen: Large pannus.  Nontender. Extremities: 1+ pitting edema  Brief Hospital Course:  [name] White is a 64 y.o. female presenting with SOB, HA, myalgias, arthralgias, and chills. PMH is significant for Paroxysmal A fib, respiratory failure chronically on 6 L [place], OSA. HFpEF , T2DM, MDD, HTN, HLD, PE, and GERD.  Acute on chronic respiratory failure with hypoxia Patient presented with increased SOB and concern for increased leg swelling. Also expressed concern for exposure to COVID -19 via patient's home health aid. COVID test on admission was negative for patient.  CXR at admission suggested stable elevation of the right hemidiaphragm and minimal left basilar atelectasis with otherwise no acute cardiopulmonary processes.  Physical exam on admission patient noted to have significant SOB and upper airway wheezing with inspiratory crackles in all lobes.  CTA chest PE and admission found no CT evidence of central pulmonary artery embolus there was evidence of bilateral consolidative changes which may have represented atelectasis or pneumonia. WBC was within normal limits,  patient was afebrile, CMP had no significant electrolyte abnormalities. Na at admission 140 and K 4.4. Lactic acid WNL. VBG pH 7.49, pO2 179, and HCO3 44.  Patient was on 6L [place] at home and was satting below 90% on 5L [place] in the ED. Increased to 6L and O2 sat 92%. Patient was treated for PNA with azithromycin and CTX with steroids and discharged on 6 additional days of oral doxycycline without additional steroid therapy.   Patient's home medications included Singulair, and Ventolin HFA inhaler, and Trilogy Phillips forced air machine.  Patient PFTs, 06/2018, do not correlate with COPD diagnosis decreasing suspicion for COPD exacerbation. Patient was able to wean to home oxygen requirement and continued nighttime bipap during admission.   Home Health Needs  Transitions of care was consulted during admission as patient requested different agency as last home health aid was connected with patient's concern for covid exposure. Patient requested [place] services. Patient was discharged with home health services to be continued. Patient will continue to follow with [facility] care management at the [facility].  Physical therapy evaluated patient and recommended home health in addition to 24 hour assistance as the patient declined SNF.   HFpEF Patient was on  furosemide 60 daily per home regimen.  Due to crackles on pulmonary exam, the patient was continued on lasix for diuresis.   Issues for Follow Up:  1. Please confirm patient is tolerating home inhalers and using them regularly.  2. Please assess for improved respiratory status and decreased cough frequency.  3. Please confirm patient has home health services set up including home health aid and PT as recommended during this hospitalization.  4. Confirm patient has been able to use nightly bipap and check pulse oximetry to monitor respiratory status.    Significant Procedures: None  Significant  Labs and Imaging:  Recent Labs  Lab 02/06/20 1444 02/06/20 1454  02/07/20 0056  WBC 8.6  --  10.1  HGB 11.9* 13.3 12.8  HCT 40.4 39.0 43.3  PLT 418*  --  492*   Recent Labs  Lab 02/06/20 1444 02/06/20 1444 02/06/20 1454 02/07/20 0056  NA 140  --  140 140  K 4.4   < > 4.4 3.9  CL 96*  --   --  96*  CO2 33*  --   --  32  GLUCOSE 94  --   --  191*  BUN 12  --   --  10  CREATININE 0.54  --   --  0.62  CALCIUM 8.5*  --   --  8.7*  MG 1.8  --   --   --   ALKPHOS 90  --   --  100  AST 35  --   --  23  ALT 20  --   --  21  ALBUMIN 3.2*  --   --  3.5   < > = values in this interval not displayed.    CT Angio Chest PE W and/or Wo Contrast  Result Date: 02/06/2020 CLINICAL DATA:  64 year old female with concern for pulmonary embolism. EXAM: CT ANGIOGRAPHY CHEST WITH CONTRAST TECHNIQUE: Multidetector CT imaging of the chest was performed using the standard protocol during bolus administration of intravenous contrast. Multiplanar CT image reconstructions and MIPs were obtained to evaluate the vascular anatomy. CONTRAST:  135mL OMNIPAQUE IOHEXOL 350 MG/ML SOLN COMPARISON:  Chest CT dated 08/31/2019 and radiograph dated 02/06/2020. FINDINGS: Evaluation of this exam is limited due to respiratory motion artifact. Cardiovascular: Top-normal cardiac size. No pericardial effusion. The thoracic aorta is unremarkable. Evaluation of the pulmonary arteries is limited due to respiratory motion artifact and streak artifact caused by patient's arms. No large or central pulmonary artery embolus identified. Mediastinum/Nodes: There is no hilar or mediastinal adenopathy. The esophagus is grossly unremarkable. No mediastinal fluid collection. Lungs/Pleura: Bilateral lower lobe patchy consolidative changes as well as areas of subsegmental densities along the fissures in the upper lobes may represent atelectasis but concerning for pneumonia. Clinical correlation is recommended. No pleural effusion or pneumothorax. The central airways are patent. Upper Abdomen: A 1 cm hypodense  focus in the left lobe of the liver is not characterized. Musculoskeletal: Degenerative changes of the spine. No acute osseous pathology. Review of the MIP images confirms the above findings. IMPRESSION: 1. No CT evidence of central pulmonary artery embolus. 2. Bilateral consolidative changes which may represent atelectasis or pneumonia. Clinical correlation is recommended. Electronically Signed   By: Anner Crete M.D.   On: 02/06/2020 19:24   DG Chest Port 1 View  Result Date: 02/06/2020 CLINICAL DATA:  Dyspnea, fatigue EXAM: PORTABLE CHEST 1 VIEW COMPARISON:  Chest radiograph 11/24/2019, CT chest 08/31/2019 FINDINGS: Elevation of the right hemidiaphragm is unchanged. Minimal left basilar atelectasis. Lungs are otherwise clear. No pneumothorax or pleural effusion. Cardiac size within normal limits. Pulmonary vascularity is normal. No acute bone abnormality IMPRESSION: Stable elevation of the right hemidiaphragm. Minimal left basilar atelectasis. Otherwise no acute cardiopulmonary process. Electronically Signed   By: Fidela Salisbury MD   On: 02/06/2020 15:46    Results/Tests Pending at Time of Discharge: None  Discharge Medications:  Allergies as of 02/08/2020      Reactions   Caffeine Other (See Comments)   Migraine   Hydralazine Hcl Other (See Comments)   AKI leading to rhabdo and electrolyte  abnormalities   Hydrocodone Nausea And Vomiting   Headache, also   Ciprofloxacin Hives, Rash   Erythromycin Hives, Rash   Lisinopril Cough   Oxycodone Nausea And Vomiting, Other (See Comments)   Headaches, also   Sulfamethoxazole-trimethoprim Rash   Tape Rash, Other (See Comments)      Medication List    STOP taking these medications   FeroSul 325 (65 FE) MG tablet Generic drug: ferrous sulfate   traMADol 50 MG tablet Commonly known as: ULTRAM     TAKE these medications   acetaminophen 325 MG tablet Commonly known as: TYLENOL Take 2 tablets (650 mg total) by mouth every 6 (six) hours  as needed for mild pain.   apixaban 5 MG Tabs tablet Commonly known as: ELIQUIS Take 1 tablet (5 mg total) by mouth 2 (two) times daily.   atorvastatin 40 MG tablet Commonly known as: LIPITOR Take 1 tablet (40 mg total) by mouth daily. What changed: when to take this   azelastine 0.05 % ophthalmic solution Commonly known as: OPTIVAR INSTILL 1 DROP IN BOTH EYES TWICE DAILY AS NEEDED FOR ITCHY EYES What changed: See the new instructions.   azelastine 0.1 % nasal spray Commonly known as: ASTELIN Place 2 sprays into both nostrils 2 (two) times daily. Use in each nostril as directed What changed:   when to take this  additional instructions   cetirizine 10 MG tablet Commonly known as: ZYRTEC Take 1 tablet (10 mg total) by mouth daily.   cyclobenzaprine 5 MG tablet Commonly known as: FLEXERIL Take 1 tablet (5 mg total) by mouth 2 (two) times daily as needed for muscle spasms.   cycloSPORINE 0.05 % ophthalmic emulsion Commonly known as: RESTASIS Place 1 drop into both eyes 2 (two) times daily as needed (dry eyes).   diclofenac sodium 1 % Gel Commonly known as: VOLTAREN APPLY 2 GRAMS EXTERNALLY TO THE AFFECTED AREA FOUR TIMES DAILY What changed: See the new instructions.   diltiazem 120 MG 24 hr capsule Commonly known as: CARDIZEM CD Take 1 capsule (120 mg total) by mouth daily. Please schedule appt for future refills. 3rd attempt. What changed:   when to take this  additional instructions   doxycycline 100 MG tablet Commonly known as: VIBRA-TABS Take 1 tablet (100 mg total) by mouth every 12 (twelve) hours for 6 days.   famotidine 20 MG tablet Commonly known as: PEPCID Take 1 tablet (20 mg total) by mouth daily.   flecainide 100 MG tablet Commonly known as: TAMBOCOR Take 1 tablet (100 mg total) by mouth 2 (two) times daily. Please make overdue appt with Dr. Lovena Le before anymore refills. 2nd attempt What changed:   when to take this  additional  instructions   fluticasone 50 MCG/ACT nasal spray Commonly known as: FLONASE Place 2 sprays into both nostrils daily as needed for allergies or rhinitis.   furosemide 40 MG tablet Commonly known as: LASIX TAKE 1 AND 1/2 TABLETS(60 MG) BY MOUTH DAILY   gabapentin 100 MG capsule Commonly known as: NEURONTIN Take 2 capsules (200 mg total) by mouth 3 (three) times daily.   guaiFENesin 600 MG 12 hr tablet Commonly known as: MUCINEX Take 1 tablet (600 mg total) by mouth 2 (two) times daily as needed for cough.   hydrOXYzine 25 MG capsule Commonly known as: VISTARIL TAKE 1 CAPSULE(25 MG) BY MOUTH DAILY AS NEEDED FOR ANXIETY   Incruse Ellipta 62.5 MCG/INH Aepb Generic drug: umeclidinium bromide Inhale 1 puff into the lungs daily.  ipratropium-albuterol 0.5-2.5 (3) MG/3ML Soln Commonly known as: DUONEB Take 3 mLs by nebulization every 6 (six) hours as needed (shortness of breath, wheezing).   melatonin 3 MG Tabs tablet Take 1 tablet (3 mg total) by mouth at bedtime as needed (sleep). What changed: when to take this   metoprolol tartrate 25 MG tablet Commonly known as: LOPRESSOR Take 0.5 tablets (12.5 mg total) by mouth 2 (two) times daily.   mometasone-formoterol 100-5 MCG/ACT Aero Commonly known as: DULERA Inhale 2 puffs into the lungs 2 (two) times daily.   montelukast 10 MG tablet Commonly known as: SINGULAIR Take 1 tablet (10 mg total) by mouth at bedtime.   omeprazole 20 MG capsule Commonly known as: PRILOSEC Take 1 capsule (20 mg total) by mouth daily. What changed: when to take this   OXYGEN Inhale 6-7 L/min into the lungs continuous.   potassium chloride 10 MEQ tablet Commonly known as: KLOR-CON TAKE 2 TABLETS(20 MEQ) BY MOUTH DAILY What changed: See the new instructions.   PRESCRIPTION MEDICATION See admin instructions. Trilogy [employer]-  At bedtime and during any time of rest   venlafaxine 37.5 MG tablet Commonly known as:  EFFEXOR Take 1 tablet (37.5 mg total) by mouth daily with breakfast. Take with a 150 mg ER capsule every morning   venlafaxine XR 150 MG 24 hr capsule Commonly known as: EFFEXOR-XR Take 1 capsule (150 mg total) by mouth daily with breakfast. Take with a 37.5 mg tablet every morning   Ventolin HFA 108 (90 Base) MCG/ACT inhaler Generic drug: albuterol INHALE 2 PUFFS INTO THE LUNGS EVERY 6 HOURS AS NEEDED FOR WHEEZING OR SHORTNESS OF BREATH What changed:   how much to take  how to take this  when to take this  reasons to take this  additional instructions       Discharge Instructions: Please refer to Patient Instructions section of EMR for full details.  Patient was counseled important signs and symptoms that should prompt return to medical care, changes in medications, dietary instructions, activity restrictions, and follow up appointments.   Follow-Up Appointments:  [place] [place] [facility] Follow up.   Specialty: Home Health Services Why: [facility] will be providing home health PT/aide Contact information: [address] [place] [address] [phone]        Doristine Mango L, DO. Go on 02/13/2020.   Specialty: Family Medicine Contact information: [address] [place] [address] [phone]               Eulis Foster, MD 02/11/2020, 10:59 PM PGY-2, [place]

## 2020-02-08 NOTE — Discharge Instructions (Signed)
Continue taking your previous home medications as prescribed.  Take doxycycline twice a day for the full course.  If you have any worsening of her symptoms for your follow-up appointment, you can schedule an appointment sooner by calling our office at [phone].  Information on my medicine - ELIQUIS (apixaban)  Why was Eliquis prescribed for you? Eliquis was prescribed for you to reduce the risk of a blood clot forming that can cause a stroke if you have a medical condition called atrial fibrillation (a type of irregular heartbeat).  What do You need to know about Eliquis ? Take your Eliquis TWICE DAILY - one tablet in the morning and one tablet in the evening with or without food. If you have difficulty swallowing the tablet whole please discuss with your pharmacist how to take the medication safely.  Take Eliquis exactly as prescribed by your doctor and DO NOT stop taking Eliquis without talking to the doctor who prescribed the medication.  Stopping may increase your risk of developing a stroke.  Refill your prescription before you run out.  After discharge, you should have regular check-up appointments with your healthcare provider that is prescribing your Eliquis.  In the future your dose may need to be changed if your kidney function or weight changes by a significant amount or as you get older.  What do you do if you miss a dose? If you miss a dose, take it as soon as you remember on the same day and resume taking twice daily.  Do not take more than one dose of ELIQUIS at the same time to make up a missed dose.  Important Safety Information A possible side effect of Eliquis is bleeding. You should call your healthcare provider right away if you experience any of the following: ? Bleeding from an injury or your nose that does not stop. ? Unusual colored urine (red or dark brown) or unusual colored stools (red or black). ? Unusual bruising for unknown reasons. ? A serious fall or if  you hit your head (even if there is no bleeding).  Some medicines may interact with Eliquis and might increase your risk of bleeding or clotting while on Eliquis. To help avoid this, consult your healthcare provider or pharmacist prior to using any new prescription or non-prescription medications, including herbals, vitamins, non-steroidal anti-inflammatory drugs (NSAIDs) and supplements.  This website has more information on Eliquis (apixaban): http://www.eliquis.com/eliquis/home

## 2020-02-08 NOTE — Plan of Care (Signed)

## 2020-02-08 NOTE — Progress Notes (Signed)
Discharge summary provided to pt with instructions. Pt verbalized understanding of instructions. All questions and concerns were answered. No complaints.

## 2020-02-08 NOTE — Progress Notes (Signed)
Family Medicine Teaching Service Daily Progress Note Intern Pager: [phone]  Patient name: [name] White Medical record number: [id number] Date of birth: Feb 22, 1956 Age: 64 y.o. Gender: female  Primary Care Provider: Richarda Osmond, DO Consultants: None Code Status: DNR  Pt Overview and Major Events to Date:  Admitted 02/06/20  Assessment and Plan: [name] White is a 64 y.o. female presenting with SOB, HA, myalgias, arthralgias, and chills. PMH is significant for Paroxysmal A fib, respiratory failure chronically on 6 L [place], OSA. HFpEF , T2DM, MDD, HTN, HLD,  PE, and GERD.  Acute on chronic respiratory failure with hypoxia Patient has recurrent admissions for acute respiratory failure.  Patient with recent symptoms concerning for respiratory infection.  CTA showed changes possibly consistent with pneumonia.  Patient remains afebrile with normal white count since admission.  Currently at her baseline oxygen requirement of 6 L.  Ceftriaxone and azithromycin started in the ED. -Doxycycline 100 mg twice daily for 7 total days. -Albuterol nebulizer every 4 hours as needed for wheezing -Supplemental oxygen as needed.  Baseline 6 L -BiPAP nightly -PT/OT eval and treat -Continuous pulse ox -Continue Claritin -Continue Singulair -Continue Mucinex -Continue azelastine spray  Paroxysmal atrial fibrillation Patient tachycardic on admission, but this resolved with administration of her home medication.  All medication includes diltiazem, flecainide, metoprolol, apixaban -Continue home meds  HFpEF Most recent echo 60 to 65% ejection fraction with severely hypertrophied left ventricle.  Home medication close furosemide 60 mg daily.  Patient still with some crackles in lower lung fields, but her weight is currently over 1 kg less than her discharge weight during last hospitalization in May.  No office visits in between then and now to get additional measurements.  Do not believe she would  benefit from additional diuresis over her home regimen at this time. -Continue home medication -Low-sodium diet with 1500 mL fluid restriction -Monitor I's and O's  GERD Patient takes famotidine and Protonix at home -Continue home medication.  Formulary substitution omeprazole to pantoprazole  Prediabetes Patient has a history of A1c between 6-6.5 with one value of 7.8. Current A1C 6.4. CBG at presentation was 94.  Patient has no home medications as it is diet controlled. -We will continue to monitor -Continue control with meals -f/u HgbA1c  Hx of PE  Pt has a hx of PE and complains of chronic leg swelling/pain, left greater than right.  todays exam is consistent with previous admissions.  CTA did not show evidence of PE.  - continue eliquis.   HTN Patient has a history of hypertension at baseline.    Normotensive since taking home medications. Home medications include diltiazem and metoprolol. -Continue home medications  HLD Patient has a history of HLD.  Her medications include Lipitor.  -Continue home medications   RLS Patient has a history of restless leg syndrome.  Takes, gabapentin,Voltaren gel, and Tylenol at home. -Continue home medications.  MDD Patient has a history of MDD, home meds include venlafaxine XR and venlafaxine and hydroxyzine. -Continue home medications  FEN/GI: Low-sodium with 1500 mL fluid restriction Prophylaxis: Home Eliquis   Disposition: Home  Subjective:  Patient states she is ready to go home today.  Symptoms are improving.  She states that she had some wheezing this morning when she first woke up but improved with her nebulizer treatment.  Objective: Temp:  [97.7 F (36.5 C)-98 F (36.7 C)] 97.7 F (36.5 C) (08/13 2002) Pulse Rate:  [73-92] 92 (08/13 2002) Resp:  [16-18] 16 (08/13 2002)  BP: (124-138)/(65-97) 124/75 (08/13 2002) SpO2:  [93 %-100 %] 100 % (08/13 2002) Physical Exam: General: Alert.  Sitting up in  chair. Cardiovascular: Decreased heart sounds.  Regular rate and rhythm. Respiratory: No wheezing.  Diffuse inspiratory crackles heard bilaterally Abdomen: Large pannus.  Nontender. Extremities: 1+ pitting edema  Laboratory: Recent Labs  Lab 02/06/20 1444 02/06/20 1454 02/07/20 0056  WBC 8.6  --  10.1  HGB 11.9* 13.3 12.8  HCT 40.4 39.0 43.3  PLT 418*  --  492*   Recent Labs  Lab 02/06/20 1444 02/06/20 1454 02/07/20 0056  NA 140 140 140  K 4.4 4.4 3.9  CL 96*  --  96*  CO2 33*  --  32  BUN 12  --  10  CREATININE 0.54  --  0.62  CALCIUM 8.5*  --  8.7*  PROT 7.1  --  8.4*  BILITOT 1.0  --  0.6  ALKPHOS 90  --  100  ALT 20  --  21  AST 35  --  23  GLUCOSE 94  --  191*     Benay Pike, MD 02/08/2020, 2:58 AM PGY-3, [place] Intern pager: [phone], text pages welcome

## 2020-02-09 ENCOUNTER — Other Ambulatory Visit: Payer: Self-pay | Admitting: Family Medicine

## 2020-02-09 NOTE — Progress Notes (Addendum)
Pt discharged in stable condition with transport by [facility] to SNF. O2 on at 6L/min. Discharge instructions given to pt by previous nurse on previous shift. Discharge packet given to transport.

## 2020-02-09 NOTE — Plan of Care (Signed)
  Problem: Pain Managment: Goal: General experience of comfort will improve Outcome: Progressing   Problem: Safety: Goal: Ability to remain free from injury will improve Outcome: Progressing   Problem: Skin Integrity: Goal: Risk for impaired skin integrity will decrease Outcome: Progressing   

## 2020-02-11 LAB — CULTURE, BLOOD (ROUTINE X 2)
Culture: NO GROWTH
Culture: NO GROWTH
Special Requests: ADEQUATE

## 2020-02-12 ENCOUNTER — Telehealth: Payer: Self-pay | Admitting: Student in an Organized Health Care Education/Training Program

## 2020-02-12 ENCOUNTER — Telehealth: Payer: Self-pay

## 2020-02-12 NOTE — Telephone Encounter (Signed)
  Care Management   Outreach Note  02/12/2020 Name: [name] White MRN: [id number] DOB: 1956-04-18  Referred by: Richarda Osmond, DO Reason for referral : Advice Only (Homebound Covid test)   An unsuccessful telephone outreach was attempted today. The patient was referred to the case management team for assistance with care management and care coordination.   Follow Up Plan: A HIPPA compliant phone message was left for the patient providing contact information and requesting a return call.  The care management team will reach out to the patient again over the next 5 days.   Lazaro Arms RN, BSN, [facility] Care Management Coordinator [place] Phone: [phone] Fax: [phone]

## 2020-02-12 NOTE — Telephone Encounter (Signed)
Patient is unable to come in to the office due to not getting around good. Dr. Kris Mouton use to always do virtual appointments for her. She's wanting to know if she can do the virtual for tomorrows appointment. Please advise. Thanks

## 2020-02-13 ENCOUNTER — Ambulatory Visit: Payer: Medicare HMO | Admitting: Student in an Organized Health Care Education/Training Program

## 2020-02-13 ENCOUNTER — Telehealth: Payer: Self-pay | Admitting: *Deleted

## 2020-02-13 NOTE — Chronic Care Management (AMB) (Signed)
  Care Management   Note  02/13/2020 Name: MIU CHIONG MRN: [id number] DOB: June 12, 1956  TAKYLA KUCHERA is a 64 y.o. year old female who is a primary care patient of Richarda Osmond, DO and is actively engaged with the care management team. I reached out to [place] [facility] by phone today to assist with re-scheduling a follow up visit with the RN Case Manager.  Follow up plan: Unsuccessful telephone outreach attempt made. A HIPPA compliant phone message was left for the patient providing contact information and requesting a return call. The care management team will reach out to the patient again over the next 7 days. If patient returns call to provider office, please advise to call [place] at [phone].  [place], [place] [address] Direct Dial: [phone] [email or web address]

## 2020-02-14 ENCOUNTER — Other Ambulatory Visit: Payer: Self-pay | Admitting: Student in an Organized Health Care Education/Training Program

## 2020-02-14 DIAGNOSIS — E662 Morbid (severe) obesity with alveolar hypoventilation: Secondary | ICD-10-CM | POA: Diagnosis not present

## 2020-02-14 DIAGNOSIS — J9610 Chronic respiratory failure, unspecified whether with hypoxia or hypercapnia: Secondary | ICD-10-CM | POA: Diagnosis not present

## 2020-02-19 DIAGNOSIS — E1159 Type 2 diabetes mellitus with other circulatory complications: Secondary | ICD-10-CM | POA: Diagnosis not present

## 2020-02-19 DIAGNOSIS — J9611 Chronic respiratory failure with hypoxia: Secondary | ICD-10-CM | POA: Diagnosis not present

## 2020-02-19 DIAGNOSIS — J9601 Acute respiratory failure with hypoxia: Secondary | ICD-10-CM | POA: Diagnosis not present

## 2020-02-19 DIAGNOSIS — J449 Chronic obstructive pulmonary disease, unspecified: Secondary | ICD-10-CM | POA: Diagnosis not present

## 2020-02-19 NOTE — Chronic Care Management (AMB) (Signed)
  Care Management   Note  02/19/2020 Name: [name] MRN: [id number] DOB: 09/14/1955  [name] White is a 64 y.o. year old female who is a primary care patient of Richarda Osmond, DO and is actively engaged with the care management team. I reached out to [place] [employer] by phone today to assist with re-scheduling a follow up visit with the RN Case Manager.  Follow up plan: Unsuccessful telephone outreach attempt made. A HIPPA compliant phone message was left for the patient providing contact information and requesting a return call. The care management team will reach out to the patient again over the next 7 days. If patient returns call to provider office, please advise to call [place] at [phone].  [place], [place] [address] Direct Dial: [phone] [email or web address]: [email or web address]

## 2020-02-25 ENCOUNTER — Ambulatory Visit: Payer: Medicare HMO

## 2020-02-26 DIAGNOSIS — E1159 Type 2 diabetes mellitus with other circulatory complications: Secondary | ICD-10-CM | POA: Diagnosis not present

## 2020-02-26 DIAGNOSIS — J449 Chronic obstructive pulmonary disease, unspecified: Secondary | ICD-10-CM | POA: Diagnosis not present

## 2020-02-26 DIAGNOSIS — J9601 Acute respiratory failure with hypoxia: Secondary | ICD-10-CM | POA: Diagnosis not present

## 2020-02-26 NOTE — Chronic Care Management (AMB) (Signed)
Care Management   Follow Up Note   02/26/2020 Name: [name] White MRN: [id number] DOB: 03-10-1956  Referred by: Richarda Osmond, DO Reason for referral : Chronic Care Management (Pneumonia)   [name] White is a 64 y.o. year old female who is a primary care patient of Doristine Mango L, DO. The care management team was consulted for assistance with care management and care coordination needs.    Review of patient status, including review of consultants reports, relevant laboratory and other test results, and collaboration with appropriate care team members and the patient's provider was performed as part of comprehensive patient evaluation and provision of chronic care management services.    SDOH (Social Determinants of Health) assessments performed: No See Care Plan activities for detailed interventions related to [facility])     Advanced Directives: See Care Plan and Vynca application for related entries.   Goals Addressed              This Visit's Progress   .  COMPLETED: I went to the hopital due  to maintainace fixing apartment upstairs and chemicals came through my vent (pt-stated)        CARE PLAN ENTRY (see longitudinal plan of care for additional care plan information)  Current Barriers:  . Chronic Disease Management support and education needs related to post discharge instructions,for IP event on  11/09/19 including medication review , MD follow up appointments and Home Health-=patient states that she went in to the hospital due to maintenance in the building working on the apartment upstairs and used a chemical that came through the vent in her room and her trilogy machine and exacerbated her breathing issues.   Nurse Case Manager Clinical Goal(s):  Marland Kitchen Over the next 14 days, patient will verbalize understanding of plan for home health PT/OT services  Interventions:  . Evaluation of current treatment plan and patient's adherence to plan as established by  provider. . Reviewed medications with patient - patient states that she has her medications but will speak with her PCP about another medication for her allergies. . Discussed plans with patient for ongoing care management follow up and provided patient with direct contact information for care management team . Reviewed scheduled/upcoming provider appointments- appointment with Dr Kris Mouton 11-20-19 at 350 pm.  [facility] Pulmonary Care 12-02-19 . Patient has been contacted by [facility] home health and PT will start tomorrow, still waiting to hear form OT,  she has her aid from [facility] there working with her.  . 12/11/19 . Patient states that she is feeling well . She had both of her appoinnments with Dr Rosita Fire and with Pulmonoligy both telemed. She said she  needs to have a cardiology appointment then they will decide if she can have an appointment face to face. . She stated that there has been a referral placed for PT and OT to help her ambulate. . She has all of her meds except for venlafaxine 150 mg.  She is waiting for the pharmacy to let her know when it is ready. . The patient asked me to check if there are any facilities that come to the home and give covid vaccines also she wants compression socks.  I advised the patient to talk with her pcp about BP monitor. .       Patient Self Care Activities:  . Patient verbalizes understanding of plan  . Self administers medications as prescribed . Attends all scheduled provider appointments . Calls pharmacy for medication refills . Calls  provider office for new concerns or questions . Unable to independently self navigate   Initial goal documentation     .  My aid had to be tested for covid. (pt-stated)        CARE PLAN ENTRY (see longitudinal plan of care for additional care plan information)  Current Barriers:  . Chronic Disease Management support and education needs related to post discharge instructions,for IP event on  02/06/20 including  medication review , MD follow up appointments and Home Health-=patient states that she went in to the hospital due to her aid that she had in the home was suspected of having covid.  She had some symptoms and was sent to the hospital to be tested .  She states that she did not have covid but she has pneumonia.  She states that she was treated and was stable and then sent home but she does not have Home health and will not use the company that she had before.  Nurse Case Manager Clinical Goal(s):  Marland Kitchen Over the next 30 days, patient will verbalize understanding of plan for home health  Interventions:  . Inter-disciplinary care team collaboration (see longitudinal plan of care) . Evaluation of current treatment plan related to IP admission on 02-06-20 and patient's adherence to plan as established by provider. . Reviewed medications with patient and she has all of her meds . Collaborated with PCP regarding referral for hoe health . Discussed plans with patient for ongoing care management follow up and provided patient with direct contact information for care management team  Patient Self Care Activities:  . Patient verbalizes understanding of plan Self administers medications as prescribed . Calls pharmacy for medication refills . Calls provider office for new concerns or questions . Unable to independently self manage home health agency  Initial goal documentation         The care management team will reach out to the patient again over the next 14 days.   Lazaro Arms RN, BSN, [facility] Care Management Coordinator [place] Phone: [phone] Fax: [phone]

## 2020-02-26 NOTE — Patient Instructions (Signed)
Visit Information  Goals Addressed              This Visit's Progress   .  COMPLETED: I went to the hopital due  to maintainace fixing apartment upstairs and chemicals came through my vent (pt-stated)        CARE PLAN ENTRY (see longitudinal plan of care for additional care plan information)  Current Barriers:  . Chronic Disease Management support and education needs related to post discharge instructions,for IP event on  11/09/19 including medication review , MD follow up appointments and Home Health-=patient states that she went in to the hospital due to maintenance in the building working on the apartment upstairs and used a chemical that came through the vent in her room and her trilogy machine and exacerbated her breathing issues.   Nurse Case Manager Clinical Goal(s):  Marland Kitchen Over the next 14 days, patient will verbalize understanding of plan for home health PT/OT services  Interventions:  . Evaluation of current treatment plan and patient's adherence to plan as established by provider. . Reviewed medications with patient - patient states that she has her medications but will speak with her PCP about another medication for her allergies. . Discussed plans with patient for ongoing care management follow up and provided patient with direct contact information for care management team . Reviewed scheduled/upcoming provider appointments- appointment with Dr Kris Mouton 11-20-19 at 350 pm.  [facility] Pulmonary Care 12-02-19 . Patient has been contacted by [facility] home health and PT will start tomorrow, still waiting to hear form OT,  she has her aid from [facility] there working with her.  . 12/11/19 . Patient states that she is feeling well . She had both of her appoinnments with Dr Rosita Fire and with Pulmonoligy both telemed. She said she  needs to have a cardiology appointment then they will decide if she can have an appointment face to face. . She stated that there has been a referral placed for PT and  OT to help her ambulate. . She has all of her meds except for venlafaxine 150 mg.  She is waiting for the pharmacy to let her know when it is ready. . The patient asked me to check if there are any facilities that come to the home and give covid vaccines also she wants compression socks.  I advised the patient to talk with her pcp about BP monitor. .       Patient Self Care Activities:  . Patient verbalizes understanding of plan  . Self administers medications as prescribed . Attends all scheduled provider appointments . Calls pharmacy for medication refills . Calls provider office for new concerns or questions . Unable to independently self navigate   Initial goal documentation     .  My aid had to be tested for covid. (pt-stated)        CARE PLAN ENTRY (see longitudinal plan of care for additional care plan information)  Current Barriers:  . Chronic Disease Management support and education needs related to post discharge instructions,for IP event on  02/06/20 including medication review , MD follow up appointments and Home Health-=patient states that she went in to the hospital due to her aid that she had in the home was suspected of having covid.  She had some symptoms and was sent to the hospital to be tested .  She states that she did not have covid but she has pneumonia.  She states that she was treated and was stable and then sent  home but she does not have Home health and will not use the company that she had before.  Nurse Case Manager Clinical Goal(s):  Marland Kitchen Over the next 30 days, patient will verbalize understanding of plan for home health  Interventions:  . Inter-disciplinary care team collaboration (see longitudinal plan of care) . Evaluation of current treatment plan related to IP admission on 02-06-20 and patient's adherence to plan as established by provider. . Reviewed medications with patient and she has all of her meds . Collaborated with PCP regarding referral for hoe  health . Discussed plans with patient for ongoing care management follow up and provided patient with direct contact information for care management team  Patient Self Care Activities:  . Patient verbalizes understanding of plan Self administers medications as prescribed . Calls pharmacy for medication refills . Calls provider office for new concerns or questions . Unable to independently self manage home health agency  Initial goal documentation        Ms. Ozbun was given information about Care Management services today including:  1. Care Management services include personalized support from designated clinical staff supervised by her physician, including individualized plan of care and coordination with other care providers 2. 24/7 contact phone numbers for assistance for urgent and routine care needs. 3. The patient may stop CCM services at any time (effective at the end of the month) by phone call to the office staff.  Patient agreed to services and verbal consent obtained.   The patient verbalized understanding of instructions provided today and declined a print copy of patient instruction materials.   The care management team will reach out to the patient again over the next 14 days.   Lazaro Arms RN, BSN, [facility] Care Management Coordinator [place] Phone: [phone] Fax: [phone]

## 2020-02-27 NOTE — Chronic Care Management (AMB) (Signed)
  Chronic Care Management   Note  02/27/2020 Name: ZAYRA DEVITO MRN: [id number] DOB: 10-15-1955  CERI MAYER is a 64 y.o. year old female who is a primary care patient of Richarda Osmond, DO and is actively engaged with the care management team. I reached out to [place] [employer] by phone today to assist with re-scheduling a follow up visit with the RN Case Manager  Follow up plan: Unsuccessful telephone outreach attempt made. A HIPPA compliant phone message was left for the patient providing contact information and requesting a return call. Patient returned call to RN CM.  Telephone appointment with care management team member scheduled for:03/10/2020   [place] Management

## 2020-02-28 DIAGNOSIS — J449 Chronic obstructive pulmonary disease, unspecified: Secondary | ICD-10-CM | POA: Diagnosis not present

## 2020-03-03 DIAGNOSIS — N3281 Overactive bladder: Secondary | ICD-10-CM | POA: Diagnosis not present

## 2020-03-06 ENCOUNTER — Other Ambulatory Visit: Payer: Self-pay | Admitting: Student in an Organized Health Care Education/Training Program

## 2020-03-06 ENCOUNTER — Telehealth: Payer: Self-pay

## 2020-03-06 DIAGNOSIS — J189 Pneumonia, unspecified organism: Secondary | ICD-10-CM

## 2020-03-06 DIAGNOSIS — R5381 Other malaise: Secondary | ICD-10-CM

## 2020-03-06 DIAGNOSIS — R0602 Shortness of breath: Secondary | ICD-10-CM

## 2020-03-06 NOTE — Telephone Encounter (Signed)
Patient calls nurse line requesting to speak with Traci regarding home health orders. Patient states that home health agency has not received doctor's orders for initiation of care.   To PCP and Traci  Please advise next steps  Talbot Grumbling, RN

## 2020-03-07 ENCOUNTER — Other Ambulatory Visit: Payer: Self-pay | Admitting: Family Medicine

## 2020-03-09 ENCOUNTER — Other Ambulatory Visit: Payer: Self-pay | Admitting: Student in an Organized Health Care Education/Training Program

## 2020-03-09 ENCOUNTER — Other Ambulatory Visit: Payer: Self-pay | Admitting: Internal Medicine

## 2020-03-09 ENCOUNTER — Telehealth: Payer: Self-pay | Admitting: Internal Medicine

## 2020-03-09 ENCOUNTER — Ambulatory Visit: Payer: Medicare HMO

## 2020-03-09 DIAGNOSIS — I471 Supraventricular tachycardia: Secondary | ICD-10-CM

## 2020-03-09 DIAGNOSIS — J9611 Chronic respiratory failure with hypoxia: Secondary | ICD-10-CM | POA: Diagnosis not present

## 2020-03-09 MED ORDER — DILTIAZEM HCL ER COATED BEADS 120 MG PO CP24: 120.0000 mg | ORAL_CAPSULE | Freq: Every day | ORAL | 0 refills | Status: DC

## 2020-03-09 MED ORDER — FLECAINIDE ACETATE 100 MG PO TABS: 100.0000 mg | ORAL_TABLET | Freq: Two times a day (BID) | ORAL | 0 refills | Status: DC

## 2020-03-09 NOTE — Telephone Encounter (Signed)
*  STAT* If patient is at the pharmacy, call can be transferred to refill team.   1. Which medications need to be refilled? (please list name of each medication and dose if known)  diltiazem (CARDIZEM CD) 120 MG 24 hr capsule flecainide (TAMBOCOR) 100 MG tablet   2. Which pharmacy/location (including street and city if local pharmacy) is medication to be sent to? [facility] [phone] - [place], [place]  3. Do they need a 30 day or 90 day supply? 30 day

## 2020-03-09 NOTE — Chronic Care Management (AMB) (Signed)
Care Management   Follow Up Note   03/09/2020 Name: [name] White MRN: [id number] DOB: 1955/07/15  Referred by: Richarda Osmond, DO Reason for referral : Chronic Care Management ([place])   [name] White is a 64 y.o. year old female who is a primary care patient of Doristine Mango L, DO. The care management team was consulted for assistance with care management and care coordination needs.    Review of patient status, including review of consultants reports, relevant laboratory and other test results, and collaboration with appropriate care team members and the patient's provider was performed as part of comprehensive patient evaluation and provision of chronic care management services.    SDOH (Social Determinants of Health) assessments performed: No See Care Plan activities for detailed interventions related to [facility])     Advanced Directives: See Care Plan and Vynca application for related entries.   Goals Addressed              This Visit's Progress   .  My aid had to be tested for covid. (pt-stated)        CARE PLAN ENTRY (see longitudinal plan of care for additional care plan information)  Current Barriers:  . Chronic Disease Management support and education needs related to post discharge instructions,for IP event on  02/06/20 including medication review , MD follow up appointments and Home Health-=patient states that she went in to the hospital due to her aid that she had in the home was suspected of having covid.  She had some symptoms and was sent to the hospital to be tested .  She states that she did not have covid but she has pneumonia.  She states that she was treated and was stable and then sent home but she does not have Home health and will not use the company that she had before.  Nurse Case Manager Clinical Goal(s):  Marland Kitchen Over the next 30 days, patient will verbalize understanding of plan for home health  Interventions:  . Inter-disciplinary care  team collaboration (see longitudinal plan of care) . Evaluation of current treatment plan related to IP admission on 02-06-20 and patient's adherence to plan as established by provider. . Reviewed medications with patient and she has all of her meds . Collaborated with PCP regarding referral for hoe health . Patient called in this morning wanting to confirm with me that I had received her message regarding her home health.  I let her know that I did and that the Doctor had placed her order for home health and the referral coordinator is working on it. With covid happing at this time there are shortages going on and I let her know that it may take a little time to be patient  with [place] and she stated that she understood. . Patient also wants to see about getting a flu shot.  I notified her that I will try to see if there are places that will go out to the home and give the shot. . Discussed plans with patient for ongoing care management follow up and provided patient with direct contact information for care management team  Patient Self Care Activities:  . Patient verbalizes understanding of plan Self administers medications as prescribed . Calls pharmacy for medication refills . Calls provider office for new concerns or questions . Unable to independently self manage home health agency  Please see past updates related to this goal by clicking on the "Past Updates" button in the selected goal  RNCM will follow up with the patient within the next month of  September.  Lazaro Arms RN, BSN, [facility] Care Management Coordinator [place] Phone: [phone] Fax: [phone]   [facility]

## 2020-03-09 NOTE — Patient Instructions (Signed)
Visit Information  Goals Addressed              This Visit's Progress   .  My aid had to be tested for covid. (pt-stated)        CARE PLAN ENTRY (see longitudinal plan of care for additional care plan information)  Current Barriers:  . Chronic Disease Management support and education needs related to post discharge instructions,for IP event on  02/06/20 including medication review , MD follow up appointments and Home Health-=patient states that she went in to the hospital due to her aid that she had in the home was suspected of having covid.  She had some symptoms and was sent to the hospital to be tested .  She states that she did not have covid but she has pneumonia.  She states that she was treated and was stable and then sent home but she does not have Home health and will not use the company that she had before.  Nurse Case Manager Clinical Goal(s):  Marland Kitchen Over the next 30 days, patient will verbalize understanding of plan for home health  Interventions:  . Inter-disciplinary care team collaboration (see longitudinal plan of care) . Evaluation of current treatment plan related to IP admission on 02-06-20 and patient's adherence to plan as established by provider. . Reviewed medications with patient and she has all of her meds . Collaborated with PCP regarding referral for hoe health . Patient called in this morning wanting to confirm with me that I had received her message regarding her home health.  I let her know that I did and that the Doctor had placed her order for home health and the referral coordinator is working on it. With covid happing at this time there are shortages going on and I let her know that it may take a little time to be patient  with [place] and she stated that she understood. . Patient also wants to see about getting a flu shot.  I notified her that I will try to see if there are places that will go out to the home and give the shot. . Discussed plans with patient for  ongoing care management follow up and provided patient with direct contact information for care management team  Patient Self Care Activities:  . Patient verbalizes understanding of plan Self administers medications as prescribed . Calls pharmacy for medication refills . Calls provider office for new concerns or questions . Unable to independently self manage home health agency  Please see past updates related to this goal by clicking on the "Past Updates" button in the selected goal         Ms. Cahn was given information about Care Management services today including:  1. Care Management services include personalized support from designated clinical staff supervised by her physician, including individualized plan of care and coordination with other care providers 2. 24/7 contact phone numbers for assistance for urgent and routine care needs. 3. The patient may stop CCM services at any time (effective at the end of the month) by phone call to the office staff.  Patient agreed to services and verbal consent obtained.   The patient verbalized understanding of instructions provided today and declined a print copy of patient instruction materials.   RNCM will follow up with the patient within the next month of  September.  Lazaro Arms RN, BSN, [facility] Care Management Coordinator [place] Phone: [phone] Fax: [phone]

## 2020-03-09 NOTE — Telephone Encounter (Signed)
Pt's medications were sent to pt's pharmacy as requested. Confirmation received.  

## 2020-03-10 ENCOUNTER — Telehealth: Payer: Medicare HMO

## 2020-03-10 ENCOUNTER — Telehealth: Payer: Self-pay

## 2020-03-10 NOTE — Telephone Encounter (Signed)
  Care Management   Outreach Note  03/10/2020 Name: [name] White MRN: [id number] DOB: 1956/03/24  Referred by: Richarda Osmond, DO Reason for referral : Appointment (Pneumonia F/U)   An unsuccessful telephone outreach was attempted today. The patient was referred to the case management team for assistance with care management and care coordination.   Unable to leave a message the mailbox is full.  Follow Up Plan: The care management team will reach out to the patient again over the next 7-14 days.   Lazaro Arms RN, BSN, [facility] Care Management Coordinator [place] Phone: [phone] Fax: [phone]

## 2020-03-11 ENCOUNTER — Other Ambulatory Visit: Payer: Self-pay | Admitting: Internal Medicine

## 2020-03-11 ENCOUNTER — Telehealth: Payer: Self-pay | Admitting: *Deleted

## 2020-03-11 DIAGNOSIS — R0689 Other abnormalities of breathing: Secondary | ICD-10-CM | POA: Diagnosis not present

## 2020-03-11 DIAGNOSIS — R0789 Other chest pain: Secondary | ICD-10-CM | POA: Diagnosis not present

## 2020-03-11 DIAGNOSIS — R0602 Shortness of breath: Secondary | ICD-10-CM | POA: Diagnosis not present

## 2020-03-11 DIAGNOSIS — R079 Chest pain, unspecified: Secondary | ICD-10-CM | POA: Diagnosis not present

## 2020-03-11 DIAGNOSIS — I471 Supraventricular tachycardia: Secondary | ICD-10-CM

## 2020-03-11 DIAGNOSIS — R069 Unspecified abnormalities of breathing: Secondary | ICD-10-CM | POA: Diagnosis not present

## 2020-03-11 NOTE — Chronic Care Management (AMB) (Signed)
  Care Management   Note  03/11/2020 Name: [name] White MRN: [id number] DOB: 10/16/1955  [name] is a 63 y.o. year old female who is a primary care patient of Richarda Osmond, DO and is actively engaged with the care management team. I reached out to [place] [employer] by phone today to assist with re-scheduling a follow up visit with the RN Case Manager.  Follow up plan: Unsuccessful telephone outreach attempt made. A HIPAA compliant phone message was left for the patient providing contact information and requesting a return call. The care management team will reach out to the patient again over the next 7 days. If patient returns call to provider office, please advise to call [place] at [phone].  [place], [place] [address] Direct Dial: [phone] [email or web address]: [email or web address]

## 2020-03-12 ENCOUNTER — Inpatient Hospital Stay (HOSPITAL_COMMUNITY)
Admission: EM | Admit: 2020-03-12 | Discharge: 2020-03-17 | DRG: 291 | Disposition: A | Discharge status: Home Health | Payer: Medicare HMO | Attending: Family Medicine | Admitting: Family Medicine

## 2020-03-12 ENCOUNTER — Other Ambulatory Visit: Payer: Self-pay

## 2020-03-12 ENCOUNTER — Emergency Department (HOSPITAL_COMMUNITY): Payer: Medicare HMO

## 2020-03-12 DIAGNOSIS — K219 Gastro-esophageal reflux disease without esophagitis: Secondary | ICD-10-CM | POA: Diagnosis present

## 2020-03-12 DIAGNOSIS — J9601 Acute respiratory failure with hypoxia: Secondary | ICD-10-CM | POA: Diagnosis not present

## 2020-03-12 DIAGNOSIS — Z20822 Contact with and (suspected) exposure to covid-19: Secondary | ICD-10-CM | POA: Diagnosis present

## 2020-03-12 DIAGNOSIS — Z79899 Other long term (current) drug therapy: Secondary | ICD-10-CM

## 2020-03-12 DIAGNOSIS — Z7901 Long term (current) use of anticoagulants: Secondary | ICD-10-CM

## 2020-03-12 DIAGNOSIS — G2581 Restless legs syndrome: Secondary | ICD-10-CM | POA: Diagnosis present

## 2020-03-12 DIAGNOSIS — J441 Chronic obstructive pulmonary disease with (acute) exacerbation: Secondary | ICD-10-CM

## 2020-03-12 DIAGNOSIS — R0602 Shortness of breath: Secondary | ICD-10-CM | POA: Diagnosis not present

## 2020-03-12 DIAGNOSIS — D638 Anemia in other chronic diseases classified elsewhere: Secondary | ICD-10-CM | POA: Diagnosis present

## 2020-03-12 DIAGNOSIS — Z90710 Acquired absence of both cervix and uterus: Secondary | ICD-10-CM

## 2020-03-12 DIAGNOSIS — J9610 Chronic respiratory failure, unspecified whether with hypoxia or hypercapnia: Secondary | ICD-10-CM | POA: Diagnosis not present

## 2020-03-12 DIAGNOSIS — E1169 Type 2 diabetes mellitus with other specified complication: Secondary | ICD-10-CM | POA: Diagnosis present

## 2020-03-12 DIAGNOSIS — J9602 Acute respiratory failure with hypercapnia: Secondary | ICD-10-CM | POA: Diagnosis not present

## 2020-03-12 DIAGNOSIS — M2550 Pain in unspecified joint: Secondary | ICD-10-CM | POA: Diagnosis not present

## 2020-03-12 DIAGNOSIS — F329 Major depressive disorder, single episode, unspecified: Secondary | ICD-10-CM | POA: Diagnosis present

## 2020-03-12 DIAGNOSIS — I152 Hypertension secondary to endocrine disorders: Secondary | ICD-10-CM | POA: Diagnosis present

## 2020-03-12 DIAGNOSIS — Z66 Do not resuscitate: Secondary | ICD-10-CM | POA: Diagnosis present

## 2020-03-12 DIAGNOSIS — Z6841 Body Mass Index (BMI) 40.0 and over, adult: Secondary | ICD-10-CM

## 2020-03-12 DIAGNOSIS — E662 Morbid (severe) obesity with alveolar hypoventilation: Secondary | ICD-10-CM | POA: Diagnosis present

## 2020-03-12 DIAGNOSIS — I509 Heart failure, unspecified: Secondary | ICD-10-CM | POA: Diagnosis not present

## 2020-03-12 DIAGNOSIS — E876 Hypokalemia: Secondary | ICD-10-CM | POA: Diagnosis present

## 2020-03-12 DIAGNOSIS — Z7401 Bed confinement status: Secondary | ICD-10-CM | POA: Diagnosis not present

## 2020-03-12 DIAGNOSIS — Z87891 Personal history of nicotine dependence: Secondary | ICD-10-CM

## 2020-03-12 DIAGNOSIS — Z86711 Personal history of pulmonary embolism: Secondary | ICD-10-CM

## 2020-03-12 DIAGNOSIS — I5033 Acute on chronic diastolic (congestive) heart failure: Secondary | ICD-10-CM | POA: Diagnosis present

## 2020-03-12 DIAGNOSIS — E785 Hyperlipidemia, unspecified: Secondary | ICD-10-CM | POA: Diagnosis present

## 2020-03-12 DIAGNOSIS — I110 Hypertensive heart disease with heart failure: Principal | ICD-10-CM | POA: Diagnosis present

## 2020-03-12 DIAGNOSIS — J9622 Acute and chronic respiratory failure with hypercapnia: Secondary | ICD-10-CM | POA: Diagnosis not present

## 2020-03-12 DIAGNOSIS — J9621 Acute and chronic respiratory failure with hypoxia: Secondary | ICD-10-CM | POA: Diagnosis present

## 2020-03-12 DIAGNOSIS — D6859 Other primary thrombophilia: Secondary | ICD-10-CM | POA: Diagnosis present

## 2020-03-12 DIAGNOSIS — I480 Paroxysmal atrial fibrillation: Secondary | ICD-10-CM | POA: Diagnosis present

## 2020-03-12 DIAGNOSIS — I10 Essential (primary) hypertension: Secondary | ICD-10-CM | POA: Diagnosis not present

## 2020-03-12 DIAGNOSIS — I4891 Unspecified atrial fibrillation: Secondary | ICD-10-CM | POA: Diagnosis not present

## 2020-03-12 LAB — BASIC METABOLIC PANEL
Anion gap: 13 (ref 5–15)
BUN: 6 mg/dL — ABNORMAL LOW (ref 8–23)
CO2: 35 mmol/L — ABNORMAL HIGH (ref 22–32)
Calcium: 9.1 mg/dL (ref 8.9–10.3)
Chloride: 95 mmol/L — ABNORMAL LOW (ref 98–111)
Creatinine, Ser: 0.65 mg/dL (ref 0.44–1.00)
GFR calc Af Amer: >60 mL/min (ref >60)
GFR calc non Af Amer: >60 mL/min (ref >60)
Glucose, Bld: 182 mg/dL — ABNORMAL HIGH (ref 70–99)
Potassium: 3.2 mmol/L — ABNORMAL LOW (ref 3.5–5.1)
Sodium: 143 mmol/L (ref 135–145)

## 2020-03-12 LAB — PROTIME-INR
INR: 1 (ref 0.8–1.2)
Prothrombin Time: 12.9 seconds (ref 11.4–15.2)

## 2020-03-12 LAB — COMPREHENSIVE METABOLIC PANEL
ALT: 17 U/L (ref 0–44)
AST: 28 U/L (ref 15–41)
Albumin: 3.1 g/dL — ABNORMAL LOW (ref 3.5–5.0)
Alkaline Phosphatase: 96 U/L (ref 38–126)
Anion gap: 10 (ref 5–15)
BUN: 11 mg/dL (ref 8–23)
CO2: 36 mmol/L — ABNORMAL HIGH (ref 22–32)
Calcium: 8.8 mg/dL — ABNORMAL LOW (ref 8.9–10.3)
Chloride: 95 mmol/L — ABNORMAL LOW (ref 98–111)
Creatinine, Ser: 0.47 mg/dL (ref 0.44–1.00)
GFR calc Af Amer: >60 mL/min (ref >60)
GFR calc non Af Amer: >60 mL/min (ref >60)
Glucose, Bld: 101 mg/dL — ABNORMAL HIGH (ref 70–99)
Potassium: 3.8 mmol/L (ref 3.5–5.1)
Sodium: 141 mmol/L (ref 135–145)
Total Bilirubin: 0.8 mg/dL (ref 0.3–1.2)
Total Protein: 7 g/dL (ref 6.5–8.1)

## 2020-03-12 LAB — CBC WITH DIFFERENTIAL/PLATELET
Abs Immature Granulocytes: 0.03 10*3/uL (ref 0.00–0.07)
Basophils Absolute: 0 10*3/uL (ref 0.0–0.1)
Basophils Relative: 0 %
Eosinophils Absolute: 0.3 10*3/uL (ref 0.0–0.5)
Eosinophils Relative: 4 %
HCT: 36.4 % (ref 36.0–46.0)
Hemoglobin: 10.6 g/dL — ABNORMAL LOW (ref 12.0–15.0)
Immature Granulocytes: 0 %
Lymphocytes Relative: 20 %
Lymphs Abs: 1.6 10*3/uL (ref 0.7–4.0)
MCH: 28.8 pg (ref 26.0–34.0)
MCHC: 29.1 g/dL — ABNORMAL LOW (ref 30.0–36.0)
MCV: 98.9 fL (ref 80.0–100.0)
Monocytes Absolute: 0.6 10*3/uL (ref 0.1–1.0)
Monocytes Relative: 8 %
Neutro Abs: 5.5 10*3/uL (ref 1.7–7.7)
Neutrophils Relative %: 68 %
Platelets: 410 10*3/uL — ABNORMAL HIGH (ref 150–400)
RBC: 3.68 MIL/uL — ABNORMAL LOW (ref 3.87–5.11)
RDW: 17.7 % — ABNORMAL HIGH (ref 11.5–15.5)
WBC: 8.1 10*3/uL (ref 4.0–10.5)
nRBC: 0 % (ref 0.0–0.2)

## 2020-03-12 LAB — BRAIN NATRIURETIC PEPTIDE: B Natriuretic Peptide: 74.2 pg/mL (ref 0.0–100.0)

## 2020-03-12 LAB — SARS CORONAVIRUS 2 BY RT PCR (HOSPITAL ORDER, PERFORMED IN ~~LOC~~ HOSPITAL LAB): SARS Coronavirus 2: NEGATIVE

## 2020-03-12 LAB — MAGNESIUM: Magnesium: 1.7 mg/dL (ref 1.7–2.4)

## 2020-03-12 LAB — TROPONIN I (HIGH SENSITIVITY): Troponin I (High Sensitivity): 11 ng/L (ref <18)

## 2020-03-12 MED ORDER — APIXABAN 5 MG PO TABS
5.0000 mg | ORAL_TABLET | Freq: Two times a day (BID) | ORAL | Status: DC
  Administered 2020-03-12 – 2020-03-16 (×10): 5 mg via ORAL
  Filled 2020-03-12 (×10): qty 1

## 2020-03-12 MED ORDER — IPRATROPIUM-ALBUTEROL 0.5-2.5 (3) MG/3ML IN SOLN
3.0000 mL | Freq: Once | RESPIRATORY_TRACT | Status: AC
  Administered 2020-03-12: 3 mL via RESPIRATORY_TRACT
  Filled 2020-03-12: qty 3

## 2020-03-12 MED ORDER — FAMOTIDINE 20 MG PO TABS
20.0000 mg | ORAL_TABLET | Freq: Every day | ORAL | Status: DC
  Administered 2020-03-12 – 2020-03-16 (×5): 20 mg via ORAL
  Filled 2020-03-12 (×5): qty 1

## 2020-03-12 MED ORDER — HYDROXYZINE PAMOATE 25 MG PO CAPS
25.0000 mg | ORAL_CAPSULE | Freq: Every day | ORAL | Status: DC | PRN
  Filled 2020-03-12: qty 1

## 2020-03-12 MED ORDER — FLECAINIDE ACETATE 50 MG PO TABS
100.0000 mg | ORAL_TABLET | Freq: Two times a day (BID) | ORAL | Status: DC
  Administered 2020-03-12 – 2020-03-16 (×10): 100 mg via ORAL
  Filled 2020-03-12 (×3): qty 2
  Filled 2020-03-12: qty 1
  Filled 2020-03-12 (×4): qty 2
  Filled 2020-03-12: qty 1
  Filled 2020-03-12 (×2): qty 2

## 2020-03-12 MED ORDER — LORATADINE 10 MG PO TABS
10.0000 mg | ORAL_TABLET | Freq: Every day | ORAL | Status: DC
  Administered 2020-03-12 – 2020-03-16 (×5): 10 mg via ORAL
  Filled 2020-03-12 (×5): qty 1

## 2020-03-12 MED ORDER — ALBUTEROL SULFATE (2.5 MG/3ML) 0.083% IN NEBU: 2.5000 mg | INHALATION_SOLUTION | RESPIRATORY_TRACT | Status: DC | PRN

## 2020-03-12 MED ORDER — ACETAMINOPHEN 325 MG PO TABS
650.0000 mg | ORAL_TABLET | Freq: Four times a day (QID) | ORAL | Status: DC | PRN
  Administered 2020-03-13 – 2020-03-16 (×5): 650 mg via ORAL
  Filled 2020-03-12 (×5): qty 2

## 2020-03-12 MED ORDER — POLYETHYLENE GLYCOL 3350 17 G PO PACK: 17.0000 g | PACK | Freq: Every day | ORAL | Status: DC | PRN

## 2020-03-12 MED ORDER — ACETAMINOPHEN 650 MG RE SUPP: 650.0000 mg | Freq: Four times a day (QID) | RECTAL | Status: DC | PRN

## 2020-03-12 MED ORDER — KETOTIFEN FUMARATE 0.025 % OP SOLN
1.0000 [drp] | Freq: Two times a day (BID) | OPHTHALMIC | Status: DC
  Administered 2020-03-13 – 2020-03-15 (×5): 1 [drp] via OPHTHALMIC
  Filled 2020-03-12: qty 5

## 2020-03-12 MED ORDER — AZELASTINE HCL 0.1 % NA SOLN
2.0000 | Freq: Every day | NASAL | Status: DC
  Administered 2020-03-12 – 2020-03-16 (×5): 2 via NASAL
  Filled 2020-03-12: qty 30

## 2020-03-12 MED ORDER — DICLOFENAC SODIUM 1 % TD GEL
2.0000 g | Freq: Four times a day (QID) | TRANSDERMAL | Status: DC | PRN
  Administered 2020-03-12 – 2020-03-16 (×3): 2 g via TOPICAL
  Filled 2020-03-12: qty 100

## 2020-03-12 MED ORDER — FLUTICASONE PROPIONATE 50 MCG/ACT NA SUSP
2.0000 | Freq: Every day | NASAL | Status: DC | PRN
  Administered 2020-03-13 – 2020-03-14 (×2): 2 via NASAL
  Filled 2020-03-12: qty 16

## 2020-03-12 MED ORDER — ALBUTEROL SULFATE HFA 108 (90 BASE) MCG/ACT IN AERS
2.0000 | INHALATION_SPRAY | RESPIRATORY_TRACT | Status: DC | PRN
  Administered 2020-03-12: 2 via RESPIRATORY_TRACT
  Filled 2020-03-12: qty 6.7

## 2020-03-12 MED ORDER — METHYLPREDNISOLONE SODIUM SUCC 125 MG IJ SOLR
125.0000 mg | Freq: Once | INTRAMUSCULAR | Status: AC
  Administered 2020-03-12: 125 mg via INTRAVENOUS
  Filled 2020-03-12: qty 2

## 2020-03-12 MED ORDER — IPRATROPIUM-ALBUTEROL 0.5-2.5 (3) MG/3ML IN SOLN
3.0000 mL | Freq: Four times a day (QID) | RESPIRATORY_TRACT | Status: DC | PRN
  Administered 2020-03-12 – 2020-03-16 (×6): 3 mL via RESPIRATORY_TRACT
  Filled 2020-03-12 (×6): qty 3

## 2020-03-12 MED ORDER — GABAPENTIN 100 MG PO CAPS
200.0000 mg | ORAL_CAPSULE | Freq: Three times a day (TID) | ORAL | Status: DC
  Administered 2020-03-12: 200 mg via ORAL
  Administered 2020-03-12: 100 mg via ORAL
  Administered 2020-03-13 – 2020-03-16 (×11): 200 mg via ORAL
  Filled 2020-03-12 (×14): qty 2

## 2020-03-12 MED ORDER — FUROSEMIDE 40 MG PO TABS
60.0000 mg | ORAL_TABLET | Freq: Every day | ORAL | Status: DC
  Administered 2020-03-13 – 2020-03-16 (×4): 60 mg via ORAL
  Filled 2020-03-12 (×4): qty 1

## 2020-03-12 MED ORDER — DILTIAZEM HCL ER COATED BEADS 120 MG PO CP24
120.0000 mg | ORAL_CAPSULE | Freq: Every day | ORAL | Status: DC
  Administered 2020-03-12 – 2020-03-16 (×5): 120 mg via ORAL
  Filled 2020-03-12 (×5): qty 1

## 2020-03-12 MED ORDER — PANTOPRAZOLE SODIUM 40 MG PO TBEC
40.0000 mg | DELAYED_RELEASE_TABLET | Freq: Every day | ORAL | Status: DC
  Administered 2020-03-12 – 2020-03-16 (×5): 40 mg via ORAL
  Filled 2020-03-12 (×5): qty 1

## 2020-03-12 MED ORDER — VENLAFAXINE HCL 37.5 MG PO TABS
37.5000 mg | ORAL_TABLET | Freq: Every day | ORAL | Status: DC
  Administered 2020-03-13 – 2020-03-16 (×4): 37.5 mg via ORAL
  Filled 2020-03-12 (×6): qty 1

## 2020-03-12 MED ORDER — FUROSEMIDE 10 MG/ML IJ SOLN
40.0000 mg | Freq: Once | INTRAMUSCULAR | Status: AC
  Administered 2020-03-12: 40 mg via INTRAVENOUS
  Filled 2020-03-12: qty 4

## 2020-03-12 MED ORDER — VENLAFAXINE HCL ER 150 MG PO CP24
150.0000 mg | ORAL_CAPSULE | Freq: Every day | ORAL | Status: DC
  Administered 2020-03-13 – 2020-03-16 (×4): 150 mg via ORAL
  Filled 2020-03-12 (×6): qty 1

## 2020-03-12 MED ORDER — METOPROLOL TARTRATE 12.5 MG HALF TABLET
12.5000 mg | ORAL_TABLET | Freq: Two times a day (BID) | ORAL | Status: DC
  Administered 2020-03-12 – 2020-03-16 (×10): 12.5 mg via ORAL
  Filled 2020-03-12 (×10): qty 1

## 2020-03-12 MED ORDER — ATORVASTATIN CALCIUM 40 MG PO TABS
40.0000 mg | ORAL_TABLET | Freq: Every evening | ORAL | Status: DC
  Administered 2020-03-12 – 2020-03-16 (×5): 40 mg via ORAL
  Filled 2020-03-12 (×5): qty 1

## 2020-03-12 MED ORDER — MAGNESIUM OXIDE 400 (241.3 MG) MG PO TABS
400.0000 mg | ORAL_TABLET | Freq: Three times a day (TID) | ORAL | Status: AC
  Administered 2020-03-12 (×2): 400 mg via ORAL
  Filled 2020-03-12 (×2): qty 1

## 2020-03-12 MED ORDER — HYDROXYZINE HCL 25 MG PO TABS
25.0000 mg | ORAL_TABLET | Freq: Every day | ORAL | Status: DC | PRN
  Administered 2020-03-12 – 2020-03-16 (×4): 25 mg via ORAL
  Filled 2020-03-12 (×4): qty 1

## 2020-03-12 NOTE — ED Provider Notes (Signed)
[facility] EMERGENCY DEPARTMENT Provider Note   CSN: [id number] Arrival date & time: 03/12/20  0023     History Chief Complaint  Patient presents with  . Shortness of Breath    [name] White is a 64 y.o. female with a history of chronic respiratory failure on 5-7L home O2, PE on Eliquis, paroxysmal atrial fibrillation, obesity, HTN, OSA, and CHF who presents the emergency department by EMS with a chief complaint of respiratory distress.  The patient reports worsening shortness of breath for the last 2 days accompanied by bilateral chest tightness that has been constant but significantly worsened today.  She has been dyspneic both at rest and with exertion.  She is also been having worsening orthopnea, bilateral peripheral edema, and nonproductive cough.  She has been compliant with her home Lasix.  She attempted to treat her symptoms with 2 nebulizer treatments without improvement in her symptoms.  Reports that she has had a difficult time coming off of her CPAP due to shortness of breath.  States that she feels swollen, but has not been weighing herself at home.  She denies fever, vomiting, diarrhea, abdominal pain, rash, URI symptoms, palpitations.  She has been compliant with her home medications.  No missed doses of Eliquis.  She is fully vaccinated against COVID-19.  She received her second dose of the majority vaccine on July 22.  She is a former smoker.  EMS reports that patient was able to take 3 steps at home and appeared markedly dyspneic.  No noted hypoxia.  She was transported in route with CPAP and transitioned to nonrebreather on arrival to the ER.  The history is provided by the patient and medical records. No language interpreter was used.       Past Medical History:  Diagnosis Date  . Abdominal wall hematoma 03/06/2019  . Acute bronchitis 08/29/2018  . Acute on chronic respiratory failure with hypoxia ([place]) 08/29/2018  . Anxiety   . Asthma   .  Chronic lower back pain   . COPD (chronic obstructive pulmonary disease) ([place])   . Depression   . Exposure to COVID-19 virus 11/02/2018  . Fall 02/2019  . Family history of anesthesia complication    "daughter; causes her to pass out afterwards"  . GERD (gastroesophageal reflux disease)   . History of atrial flutter 06/26/2015  . History of pulmonary embolus (PE) 11/03/2014  . Hypertension   . Hyperthyroidism   . Left medial tibial stress syndrome 12/26/2017  . Lower GI bleed   . Migraine    "monthly" (12/28/2013)  . Non-traumatic rhabdomyolysis 11/03/2014  . Obesity hypoventilation syndrome ([place]) 03/06/2019  . Osteoarthritis    "both knees; back of my neck; right pelvic bone" (12/28/2013)  . Paroxysmal A-fib ([place])   . Pulmonary embolism ([place]) 12/28/2013   "2 clots in each lung"    Patient Active Problem List   Diagnosis Date Noted  . Community acquired pneumonia   . Acute respiratory failure with hypoxia ([place]) 02/06/2020  . SOB (shortness of breath) 11/24/2019  . Respiratory distress   . Respiratory failure, acute-on-chronic ([place]) 11/09/2019  . Seasonal allergies 10/03/2019  . Debility 08/02/2019  . Hypertension associated with diabetes ([place])   . Weight gain due to medication 06/05/2019  . Acute on chronic heart failure with preserved ejection fraction ([place]) 03/14/2019  . Acquired thrombophilia ([place]) due to A-Fib 03/07/2019  . Heart failure with preserved ejection fraction ([place]) 03/06/2019  . Obesity hypoventilation syndrome ([place]) 03/06/2019  .  Lower GI bleed   . Urge incontinence 01/27/2019  . Shortness of breath 08/29/2018  . OSA (obstructive sleep apnea) 07/05/2018  . Respiratory failure ([place]) 07/05/2018  . Severe episode of recurrent major depressive disorder, without psychotic features ([place])   . MDD (major depressive disorder), recurrent episode, severe ([place]) 09/07/2015  . Atrial flutter ([place]) 07/29/2015  . Asthma 11/12/2014  . History of hypertension 11/05/2014  . Paroxysmal  atrial fibrillation ([place]) 11/03/2014  . Chronic anticoagulation 11/03/2014  . Major depressive disorder 11/03/2014  . History of pulmonary embolus (PE) 11/03/2014  . MDD (major depressive disorder), recurrent severe, without psychosis ([place]) 03/05/2014  . Hot flashes 04/22/2011  . OVERACTIVE BLADDER 04/18/2008  . Osteoarthritis of both knees 04/18/2008  . Osteoarthritis involving multiple joints on both sides of body 09/14/2007  . Morbid obesity ([place]) 08/24/2006  . RESTLESS LEGS SYNDROME 08/24/2006  . HYPERTENSION, BENIGN SYSTEMIC 08/24/2006  . RHINITIS, ALLERGIC 08/24/2006  . GASTROESOPHAGEAL REFLUX, NO ESOPHAGITIS 08/24/2006    Past Surgical History:  Procedure Laterality Date  . ABDOMINAL HYSTERECTOMY    . APPENDECTOMY    . BREAST CYST EXCISION Right   . DILATION AND CURETTAGE OF UTERUS    . ELECTROPHYSIOLOGIC STUDY N/A 05/27/2015   Procedure: A-Flutter;  Surgeon: Evans Lance, MD;  Location: [facility];  Service: Cardiovascular;  Laterality: N/A;  . EXCISIONAL HEMORRHOIDECTOMY    . NASAL SINUS SURGERY  2007  . THYROIDECTOMY, PARTIAL Right 2005  . TUBAL LIGATION    . WISDOM TOOTH EXTRACTION       OB History   No obstetric history on file.     Family History  Problem Relation Age of Onset  . Osteoarthritis Mother   . Asthma Mother   . Heart failure Mother   . Breast cancer Daughter     Social History   Tobacco Use  . Smoking status: Former Smoker    Packs/day: 0.25    Years: 20.00    Pack years: 5.00    Types: Cigarettes    Quit date: 07/17/1999    Years since quitting: 20.6  . Smokeless tobacco: Never Used  Vaping Use  . Vaping Use: Never used  Substance Use Topics  . Alcohol use: Not Currently    Comment: "drank some in my 30's"  . Drug use: No    Home Medications Prior to Admission medications   Medication Sig Start Date End Date Taking? Authorizing Provider  acetaminophen (TYLENOL) 325 MG tablet Take 2 tablets (650 mg total) by mouth  every 6 (six) hours as needed for mild pain. 09/11/18   Georgette Shell, MD  apixaban (ELIQUIS) 5 MG TABS tablet Take 1 tablet (5 mg total) by mouth 2 (two) times daily. 12/31/19   Anderson, Chelsey L, DO  atorvastatin (LIPITOR) 40 MG tablet Take 1 tablet (40 mg total) by mouth daily. Patient taking differently: Take 40 mg by mouth every evening.  12/31/19   Anderson, Chelsey L, DO  azelastine (ASTELIN) 0.1 % nasal spray Place 2 sprays into both nostrils 2 (two) times daily. Use in each nostril as directed Patient taking differently: Place 2 sprays into both nostrils daily.  06/01/19   Lattie Haw, MD  azelastine (OPTIVAR) 0.05 % ophthalmic solution INSTILL 1 DROP IN BOTH EYES TWICE DAILY AS NEEDED FOR ITCHY EYES Patient taking differently: Place 1 drop into both eyes 2 (two) times daily.  01/30/20   Anderson, Chelsey L, DO  cetirizine (ZYRTEC) 10 MG tablet TAKE 1 TABLET(10 MG)  BY MOUTH DAILY 03/09/20   Doristine Mango L, DO  cyclobenzaprine (FLEXERIL) 5 MG tablet Take 1 tablet (5 mg total) by mouth 2 (two) times daily as needed for muscle spasms. 02/04/20   Anderson, Chelsey L, DO  cycloSPORINE (RESTASIS) 0.05 % ophthalmic emulsion Place 1 drop into both eyes 2 (two) times daily as needed (dry eyes).  Patient not taking: Reported on 02/06/2020    [provider]  diclofenac sodium (VOLTAREN) 1 % GEL APPLY 2 GRAMS EXTERNALLY TO THE AFFECTED AREA FOUR TIMES DAILY Patient taking differently: Apply 2 g topically 4 (four) times daily as needed (arthritis pain - knees, shoulder and thighs).  07/31/18   [name], MD  diltiazem (CARDIZEM CD) 120 MG 24 hr capsule Take 1 capsule (120 mg total) by mouth daily. Please keep upcoming appt for future refills 03/11/20   Evans Lance, MD  famotidine (PEPCID) 20 MG tablet Take 1 tablet (20 mg total) by mouth daily. 06/05/19   Anderson, Chelsey L, DO  flecainide (TAMBOCOR) 100 MG tablet Take 1 tablet (100 mg total) by mouth 2 (two) times daily. Please  keep upcoming appt for future refills 03/11/20   Evans Lance, MD  fluticasone [facility]) 50 MCG/ACT nasal spray Place 2 sprays into both nostrils daily as needed for allergies or rhinitis.  10/29/18   [provider]  furosemide (LASIX) 40 MG tablet TAKE 1 AND 1/2 TABLETS(60 MG) BY MOUTH DAILY Patient taking differently: Take 60 mg by mouth daily.  02/06/20   Anderson, Chelsey L, DO  gabapentin (NEURONTIN) 100 MG capsule Take 2 capsules (200 mg total) by mouth 3 (three) times daily. 12/31/19   Anderson, Chelsey L, DO  guaiFENesin (MUCINEX) 600 MG 12 hr tablet Take 1 tablet (600 mg total) by mouth 2 (two) times daily as needed for cough. 02/08/20   Simmons-Robinson, Makiera, MD  hydrOXYzine (VISTARIL) 25 MG capsule TAKE 1 CAPSULE(25 MG) BY MOUTH DAILY AS NEEDED FOR ANXIETY 02/17/20   Anderson, Chelsey L, DO  ipratropium-albuterol (DUONEB) 0.5-2.5 (3) MG/3ML SOLN Take 3 mLs by nebulization every 6 (six) hours as needed (shortness of breath, wheezing). 11/13/19   Wilber Oliphant, MD  Melatonin 3 MG TABS Take 1 tablet (3 mg total) by mouth at bedtime as needed (sleep). Patient taking differently: Take 3 mg by mouth at bedtime.  07/07/19   Brimage, Ronnette Juniper, DO  metoprolol tartrate (LOPRESSOR) 25 MG tablet TAKE 1/2 TABLET(12.5 MG) BY MOUTH TWICE DAILY 03/09/20   Anderson, Chelsey L, DO  mometasone-formoterol (DULERA) 100-5 MCG/ACT AERO Inhale 2 puffs into the lungs 2 (two) times daily. Patient not taking: Reported on 02/06/2020 06/05/19   Doristine Mango L, DO  montelukast (SINGULAIR) 10 MG tablet Take 1 tablet (10 mg total) by mouth at bedtime. 08/12/19   [name], MD  omeprazole (PRILOSEC) 20 MG capsule Take 1 capsule (20 mg total) by mouth daily. Patient taking differently: Take 20 mg by mouth daily before breakfast.  10/03/19   [name], MD  OXYGEN Inhale 6-7 L/min into the lungs continuous.     [provider]  potassium chloride (KLOR-CON) 10 MEQ tablet TAKE 2 TABLETS(20 MEQ) BY  MOUTH DAILY Patient taking differently: Take 20 mEq by mouth daily.  11/26/19   [name], MD  PRESCRIPTION MEDICATION See admin instructions. [employer]-  At bedtime and during any time of rest    [provider]  umeclidinium bromide (INCRUSE ELLIPTA) 62.5 MCG/INH AEPB Inhale 1 puff into the  lungs daily. Patient not taking: Reported on 02/06/2020 09/25/19   [name], MD  venlafaxine [facility]) 37.5 MG tablet Take 1 tablet (37.5 mg total) by mouth daily with breakfast. Take with a 150 mg ER capsule every morning 11/22/19   [name], MD  venlafaxine XR (EFFEXOR-XR) 150 MG 24 hr capsule Take 1 capsule (150 mg total) by mouth daily with breakfast. Take with a 37.5 mg tablet every morning 12/16/19   [name], MD  VENTOLIN HFA 108 (90 Base) MCG/ACT inhaler INHALE 2 PUFFS INTO THE LUNGS EVERY 6 HOURS AS NEEDED FOR WHEEZING OR SHORTNESS OF BREATH Patient taking differently: Inhale 2 puffs into the lungs every 4 (four) hours as needed for wheezing or shortness of breath.  11/22/19   [name], MD    Allergies    Caffeine, Hydralazine hcl, Hydrocodone, Ciprofloxacin, Erythromycin, Lisinopril, Oxycodone, Sulfamethoxazole-trimethoprim, and Tape  Review of Systems   Review of Systems  Constitutional: Negative for activity change, chills, diaphoresis and fever.  HENT: Negative for congestion.   Eyes: Negative for visual disturbance.  Respiratory: Positive for cough, chest tightness and shortness of breath.   Cardiovascular: Positive for leg swelling. Negative for chest pain and palpitations.  Gastrointestinal: Negative for abdominal pain, diarrhea and vomiting.  Genitourinary: Negative for dysuria.  Musculoskeletal: Negative for back pain, neck pain and neck stiffness.  Skin: Negative for rash.  Allergic/Immunologic: Negative for immunocompromised state.  Neurological: Negative for weakness, numbness and headaches.  Psychiatric/Behavioral:  Negative for confusion.    Physical Exam Updated Vital Signs BP 130/80 (BP Location: Right Arm)   Pulse (!) 126   Temp 98 F (36.7 C) (Oral)   Resp 20   Ht 5\' 5"  (1.651 m)   Wt (!) 156.9 kg   SpO2 99%   BMI 57.58 kg/m   Physical Exam Vitals and nursing note reviewed.  Constitutional:      General: She is not in acute distress.    Appearance: She is obese. She is not ill-appearing, toxic-appearing or diaphoretic.  HENT:     Head: Normocephalic.  Eyes:     Conjunctiva/sclera: Conjunctivae normal.  Cardiovascular:     Rate and Rhythm: Normal rate and regular rhythm.     Heart sounds: No murmur heard.  No friction rub. No gallop.   Pulmonary:     Effort: Pulmonary effort is normal. No respiratory distress.     Breath sounds: No stridor. No wheezing or rhonchi.     Comments: Lungs are clear to auscultation bilaterally.  When transitioning oxygen from nonrebreather to nasal cannula, patient developed retractions and worsening tachypnea within 30 seconds.  Abdominal:     General: There is no distension.     Palpations: Abdomen is soft. There is no mass.     Tenderness: There is no abdominal tenderness. There is no right CVA tenderness, left CVA tenderness, guarding or rebound.     Hernia: No hernia is present.  Musculoskeletal:     Cervical back: Neck supple.     Right lower leg: Edema present.     Left lower leg: Edema present.  Skin:    General: Skin is warm.     Findings: No rash.  Neurological:     Mental Status: She is alert.  Psychiatric:        Behavior: Behavior normal.     ED Results / Procedures / Treatments   Labs (all labs ordered are listed, but only abnormal results are displayed) Labs Reviewed  COMPREHENSIVE METABOLIC PANEL -  Abnormal; Notable for the following components:      Result Value   Chloride 95 (*)    CO2 36 (*)    Glucose, Bld 101 (*)    Calcium 8.8 (*)    Albumin 3.1 (*)    All other components within normal limits  CBC WITH  DIFFERENTIAL/PLATELET - Abnormal; Notable for the following components:   RBC 3.68 (*)    Hemoglobin 10.6 (*)    MCHC 29.1 (*)    RDW 17.7 (*)    Platelets 410 (*)    All other components within normal limits  SARS CORONAVIRUS 2 BY RT PCR (HOSPITAL ORDER, [facility])  BRAIN NATRIURETIC PEPTIDE  TROPONIN I (HIGH SENSITIVITY)  TROPONIN I (HIGH SENSITIVITY)    EKG None  Radiology DG Chest Port 1 View  Result Date: 03/12/2020 CLINICAL DATA:  Shortness of breath for 2 days, lower extremity swelling EXAM: PORTABLE CHEST 1 VIEW COMPARISON:  02/06/2020 FINDINGS: Single frontal view of the chest demonstrates a stable enlarged cardiac silhouette. There is chronic elevation of the right hemidiaphragm. Stable hypoventilatory changes are seen at the lung bases, left greater than right. Mild central vascular congestion. No airspace disease, effusion, or pneumothorax. IMPRESSION: 1. Stable bibasilar hypoventilatory changes. 2. Mild central vascular congestion without overt edema. Electronically Signed   By: Randa Ngo M.D.   On: 03/12/2020 00:55    Procedures .Critical Care Performed by: Joanne Gavel, PA-C Authorized by: Joanne Gavel, PA-C   Critical care provider statement:    Critical care time (minutes):  40   Critical care time was exclusive of:  Separately billable procedures and treating other patients and teaching time   Critical care was necessary to treat or prevent imminent or life-threatening deterioration of the following conditions:  Respiratory failure   Critical care was time spent personally by me on the following activities:  Ordering and performing treatments and interventions, ordering and review of laboratory studies, ordering and review of radiographic studies, pulse oximetry, re-evaluation of patient's condition, review of old charts, obtaining history from patient or surrogate, examination of patient, evaluation of patient's response to  treatment and development of treatment plan with patient or surrogate   I assumed direction of critical care for this patient from another provider in my specialty: no     (including critical care time)  Medications Ordered in ED Medications  methylPREDNISolone sodium succinate (SOLU-MEDROL) 125 mg/2 mL injection 125 mg (125 mg Intravenous Given 03/12/20 0242)  furosemide (LASIX) injection 40 mg (40 mg Intravenous Given 03/12/20 0243)  ipratropium-albuterol (DUONEB) 0.5-2.5 (3) MG/3ML nebulizer solution 3 mL (3 mLs Nebulization Given 03/12/20 0354)    ED Course  I have reviewed the triage vital signs and the nursing notes.  Pertinent labs & imaging results that were available during my care of the patient were reviewed by me and considered in my medical decision making (see chart for details).  Clinical Course as of Mar 12 520  Thu Mar 12, 2020  0230 Patient recheck. She remains on BiPAP. No hypoxia or increased work of breathing.   [MM]    Clinical Course User Index [MM] Masaji Billups, Laymond Purser, PA-C   MDM Rules/Calculators/A&P                          64 year old female with a history of chronic respiratory failure on 5-7L home O2, PE on Eliquis, paroxysmal atrial fibrillation, obesity, HTN, OSA, and CHF  presenting with 2 days of worsening dyspnea, chest tightness, cough, orthopnea, and leg swelling.  She has a history of frequent admissions for shortness of breath and respiratory distress.  The was seen and independently evaluated by Dr. Wyvonnia Dusky, attending physician.  On arrival, patient is in respiratory distress.  She was on nonrebreather.  Attempted to transition to nasal cannula on her home oxygen level, the patient did not tolerate and was started on BiPAP.  Chest x-ray has been reviewed by me with central vascular congestion.  Bicarb is also noted to be elevated and suspect there is also a COPD component and patient is retaining.  She has been given a dose of Lasix and Solu-Medrol in  the ER.  DuoNeb given. EKG unchanged from previous.  Troponin is not elevated.  Labs are otherwise reassuring.  Given that patient continues to require BiPAP, she will require admission.  Consulted family medicine residency team who will accept the patient for admission.  The patient appears reasonably stabilized for admission considering the current resources, flow, and capabilities available in the ED at this time, and I doubt any other [facility] requiring further screening and/or treatment in the ED prior to admission.  Final Clinical Impression(s) / ED Diagnoses Final diagnoses:  COPD exacerbation ([place])  Acute on chronic congestive heart failure, unspecified heart failure type [facility])    Rx / DC Orders ED Discharge Orders    None       Joanne Gavel, PA-C 03/12/20 Kenney Houseman, MD 03/12/20 [phone]

## 2020-03-12 NOTE — ED Triage Notes (Signed)
Pt BIB [facility] from home c/o of worsening SOB for past two days. States having worsening orthopnea, swelling in her legs, and dyspnea with exertion. HX COPD/CHF, states compliance with medications.

## 2020-03-12 NOTE — Progress Notes (Signed)
Family Medicine Teaching Service Daily Progress Note Intern Pager: [phone]  Patient name: [name] White Medical record number: [id number] Date of birth: Oct 05, 1955 Age: 64 y.o. Gender: female  Primary Care Provider: Richarda Osmond, DO Consultants: IP CONSULT TO FAMILY PRACTICE Code Status: Prior  Pt Overview and Major Events to Date:  Admitted on 9/15  Assessment and Plan:  [name] White is a 64 y.o. female who presented with SOB and was admitted for CHF exacerbation. PMH significant for paroxysmal Afib, chronic respiratory failure on 6L [place], OSA, HFpEF, Predoabetic, MDD, HTN, HLD, PE, and GERD   Acute on chronic respiratory failure with hypoxia Reports worsening SoB x2 days with dry cough and dyspnea at rest, and worsening BLEE ; on 6L O2 baseline at home. Patient reports compliance with home furosemide. Patient requiring  Bi-Pap with wheezes and frequent cough with sitting upright. Patient has frequent admissions for respiratory failure likely attributed to OHS. In 2020, patient PFTs indicated she is unlikely to have COPD nor asthma.  Most likely an OSA -Continuous cardiac monitoring  -Repeat Troponin -Wean off Bi-pap as able   Atrial fibrillation Patient has paroxysmal afib at baseline. EKG noted for afib and borderline QT prolongation. Home medications include diltiazem, flecainide, metoprolol tartrate, and apixaban. Mag is 1.7, in consult with Pharmacy, Flecainide goal is 2.0 will order repletion and recheck tomorrow. -Start Mag-Ox 400 mg q8 for 2 doses -Recheck Mag in AM -Continue Apixaban 5 mg BID -Continue Diltiazem 120 mg daily -Continue Flecainide 100 mg BID -Continue Lopressor 12.5 mg BID -Continuous telemetry -PT/INR ordered   HFpEF History of HF with most recent echo EF (5/31) 60-65%, severe LVH, moderate RVH, LA mildly dilated, no mitral or aortic stenosis. Edema has resolved. Will restart home dosing of lasix. -Restart Lasix 60 mg oral Daily  -Low  sodium diet with 1558mL fluid restriction -Monitor I's/O's   HTN Admission BP 127/97, most recent BP 153/137. Home medications include diltiazem and metoprolol. -Continue Diltiazem 120 mg daily -Continue Lopressor 12.5 mg BID -Continue to monitor    HLD Last lipid panel: chol 197, TG 44, HDL 82, LDL 106. Home meds include atorvastatin 40mg  daily. -Continue Atorvastatin 40 mg daily   GERD Patient history of GERD with home meds famotidine and protonix -Continue Pepcid 20 mg daily -Continue Protonix 40 mg daily   Prediatbetes Patient A1C 6.4 pm 8/13. CBG on admission was 101. Patient diet controlled at home. - Continue to monitor   Hx of PE Patient has a hx of PE with complaints of chronic leg swelling and pain L>R.  - Continue Eliquis 5 mg BID   Restless Leg Syndrome Hx of restless leg syndrome. Home meds include gabapentin, voltaren gel, and tylenol. -Continue Gabapentin 200 mg TID   MDD History of MDD. Home meds include venlafaxine XR and hydroxyzine. Hx of [place] admissions.  -Continue Venlafaxine XR 150 mg daily with breakfast -Continue Venlafaxine 37.5 mg daily with breakfast   FEN/GI: Low sodium diet with 1573mL fluid restriction PPx: Eliquis   Disposition: Progressive with Cardiac Monitoring  Prior to Admission Living Arrangement: Home Anticipated Discharge Location: Home Barriers to Discharge: Respiratory Status Anticipated discharge in approximately 1-4 day(s).   Subjective:  Interviewed patient bedside in the ED, limited due to wearing Bi-PAP.  She reports her SOB has not changed. She reports nebulizer treatment has helped some. She reports her legs are still tender but otherwise ok. She has no other complaints at this time.  Objective: Temp:  [98 F (36.7  C)] 98 F (36.7 C) (09/16 0024) Pulse Rate:  [44-127] 110 (09/16 1145) Resp:  [16-35] 17 (09/16 1145) BP: (112-188)/(74-137) 125/85 (09/16 1145) SpO2:  [93 %-100 %] 96 % (09/16 1145) FiO2 (%):   [40 %] 40 % (09/16 0800) Weight:  [156.9 kg] 156.9 kg (09/16 0024) Physical Exam:  Physical Exam Vitals and nursing note reviewed.  Constitutional:      General: She is not in acute distress.    Appearance: She is obese. She is not toxic-appearing.  HENT:     Head: Normocephalic and atraumatic.  Cardiovascular:     Rate and Rhythm: Tachycardia present. Rhythm irregular.     Pulses:          Radial pulses are 2+ on the right side and 2+ on the left side.       Dorsalis pedis pulses are 2+ on the right side and 2+ on the left side.     Heart sounds: Normal heart sounds, S1 normal and S2 normal.  Pulmonary:     Effort: Tachypnea present.     Breath sounds: Wheezing present.     Comments: On Bi-PAP Abdominal:     General: There is distension (due to body habitus).     Tenderness: There is no abdominal tenderness.  Musculoskeletal:        General: Tenderness (bilater lower extremity) present.     Right lower leg: No edema.     Left lower leg: No edema.  Neurological:     Mental Status: She is alert. Mental status is at baseline.  Psychiatric:        Mood and Affect: Mood normal.        Behavior: Behavior normal.        Thought Content: Thought content normal.      Laboratory: Recent Labs  Lab 03/12/20 0038  WBC 8.1  HGB 10.6*  HCT 36.4  PLT 410*   Recent Labs  Lab 03/12/20 0038  NA 141  K 3.8  CL 95*  CO2 36*  BUN 11  CREATININE 0.47  CALCIUM 8.8*  PROT 7.0  BILITOT 0.8  ALKPHOS 96  ALT 17  AST 28  GLUCOSE 101*    Mag: 1.7 (WNL) Trop: 11 (WNL) BNP: 74.2 (WNL)   Imaging/Diagnostic Tests:  DG Chest Portable 1 View: Stable bibasilar hypoventilatory changes. Mild central vascular congestion without overt edema.   Briant Cedar, MD 03/12/2020, 11:58 AM PGY-1, [place] Intern pager: [phone], text pages welcome

## 2020-03-12 NOTE — ED Notes (Signed)
Taken off Bi-pap

## 2020-03-12 NOTE — ED Notes (Signed)
Pt does not want medication until she has her tray

## 2020-03-12 NOTE — ED Notes (Signed)
Patient saturated in urine bed changed and patient reposition

## 2020-03-12 NOTE — H&P (Addendum)
[facility] Admission History and Physical Service Pager: [phone]  Patient name: [name] White Medical record number: [id number] Date of birth: 06/12/1956 Age: 64 y.o. Gender: female  Primary Care Provider: Richarda Osmond, DO Consultants: IP CONSULT TO FAMILY PRACTICE Code Status: Prior  Preferred Emergency Contact : Alta Corning [phone]  Chief Complaint: Shortness of Breath    Assessment and Plan: [name] White is a 64 y.o. female who presented with SOB and was admitted for CHF exacerbation. PMH significant for paroxysmal Afib, chronic respiratory failure on 6L [place], OSA, HFpEF, T2DM, MDD, HTN, HLD, PE, and GERD   Acute on chronic respiratory failure with hypoxia Patient arrived via EMS reporting worsening SoB x2 days with dry cough and dyspnea at rest, and worsening BLEE ; on 6L O2 baseline at home. Patient reports compliance with home furosemide. Patient requiring  Bi-Pap with wheezes and frequent cough with sitting upright. Patient has frequent admissions for respiratory failure likely attributed to OHS. In 2020, patient PFTs indicated she is unlikely to have COPD nor asthma.  Admission labs: CO2 36, Cr 0.47, BUN 11, BNP 74.2, Trop 11, LA 2.4, Hgb 10.6, Plt 410. CXR: stable bibasilar hypoventilatory changes, mild central vascular congestion without overt edema. COVID negative, pt fully vaccinated DDX: acute on chronic respiratory failure exacerbation vs CHF exacerbation (BNP 74.2, CXR notes vascular congestion without edema. Echo in 11/25/19 60-65% EF, severe LVH, moderate RVH, LA mildly dilated, no mitral or aortic stenosis), viral or bacterial pneumonia (less likely due to minimal findings on CXR and afebrile), COPD exacerbation (less likely due to PFTs in 2020 not consistent with COPD/Asthma) - Admit to FMTS, attending Dr. Gwendlyn Deutscher. Level of care: progressive  - Continuous cardiac monitoring  - Vitals per unit routine, O2 90-94%, Activity  with assistance   - Repeat Troponin - Wean off Bi-pap as able - Med Rec needed   Atrial fibrillation Patient has paroxysmal afib at baseline. EKG noted for afib and borderline QT prolongation. Home medications include diltiazem, flecainide, metoprolol tartrate, and apixaban.  - Continue home apixaban, diltiazem Needs med rec, likely continue home diltz, flecainide, metoprolol tartrate, and apixaban - Continuous telemetry - Needs PT/INR  HFpEF History of HF with most recent echo EF (5/31) 60-65%, severe LVH, moderate RVH, LA mildly dilated, no mitral or aortic stenosis. Patient takes furosemide 60mg  daily. Patient notes b/l LE swelling with chronically L>R leg swelling.  - s/p IV lasix in the ED - holding home lasix at this time. Restart per day team  - Low sodium diet with 1570mL fluid restriction - Monitor I's/O's  HTN Admission BP 127/97, most recent BP 153/137. Home medications include diltiazem and metoprolol. - Continue home diltiazem and metoprolol - Continue to monitor BP for need for further intervention  HLD Last lipid panel: chol 197, TG 44, HDL 82, LDL 106. Home meds include atorvastatin 40mg  daily. - Continue home atorvastatin  GERD Patient history of GERD with home meds famotidine and protonix - Med rec needed, likely continue home famotidine and protonix  Prediatbetes Patient A1C 6.4 pm 8/13. CBG on admission was 101. Patient diet controlled at home. - Continue to monitor  Hx of PE Patient has a hx of PE with complaints of chronic leg swelling and pain L>R.  - Continue Eliquis   RLS Hx of restless leg syndrome. Home meds include gabapentin, voltaren gel, and tylenol. - Continue home meds  MDD History of MDD. Home meds include venlafaxine XR and hydroxyzine. Hx of [facility]  admissions.  - Med rec needed, likely continue home meds   #FEN/GI:  . Fluids: saline lock  . Nutrition:  Low sodium with 1569mL fluid restriction  Access: PIV  VTE prophylaxis:  Chronically Anticoagulated   Disposition: Admit to progressive with telemetry.  ============================================================================= HPI [name] White is a 64 y.o. female with past medical history significant for paroxysmal Afib, chronic respiratory failure on 6L [place], OSA, HFpEF, T2DM, MDD, HTN, HLD, PE, and GERD , who presents with increasing SoB, chest tightness, leg swelling x2 days.  Patient has difficulty with speaking due to currently being on Bi-Pap. Patient reports that she has had increasing SoB with dry cough and leg swelling for the last 2 days. She reports dyspnea at rest and worsening orthopnea. Patient reports compliance with her home medications including furosemide. Patient used 2 nebulizing treatments at home with no improvement. Patient is fully vaccinated against COVID.   Review Of Systems: limited due to Bi-Pap Review of Systems  Constitutional: Negative for chills.  HENT: Negative for congestion and rhinorrhea.   Respiratory: Positive for choking, chest tightness, shortness of breath and wheezing.   Cardiovascular: Positive for chest pain and leg swelling. Negative for palpitations.  Gastrointestinal: Negative for abdominal pain, diarrhea, nausea and vomiting.  Genitourinary: Negative for dysuria.   Patient Active Problem List   Diagnosis Date Noted  . Acute exacerbation of CHF (congestive heart failure) ([place]) 03/12/2020  . Community acquired pneumonia   . Acute respiratory failure with hypoxia ([place]) 02/06/2020  . SOB (shortness of breath) 11/24/2019  . Respiratory distress   . Respiratory failure, acute-on-chronic ([place]) 11/09/2019  . Seasonal allergies 10/03/2019  . Debility 08/02/2019  . Hypertension associated with diabetes ([place])   . Weight gain due to medication 06/05/2019  . Acute on chronic heart failure with preserved ejection fraction ([place]) 03/14/2019  . Acquired thrombophilia ([place]) due to A-Fib 03/07/2019  . Heart failure with  preserved ejection fraction ([place]) 03/06/2019  . Obesity hypoventilation syndrome ([place]) 03/06/2019  . Lower GI bleed   . Urge incontinence 01/27/2019  . Shortness of breath 08/29/2018  . OSA (obstructive sleep apnea) 07/05/2018  . Respiratory failure ([place]) 07/05/2018  . Severe episode of recurrent major depressive disorder, without psychotic features ([place])   . MDD (major depressive disorder), recurrent episode, severe ([place]) 09/07/2015  . Atrial flutter ([place]) 07/29/2015  . Asthma 11/12/2014  . History of hypertension 11/05/2014  . Paroxysmal atrial fibrillation ([place]) 11/03/2014  . Chronic anticoagulation 11/03/2014  . Major depressive disorder 11/03/2014  . History of pulmonary embolus (PE) 11/03/2014  . MDD (major depressive disorder), recurrent severe, without psychosis ([place]) 03/05/2014  . Hot flashes 04/22/2011  . OVERACTIVE BLADDER 04/18/2008  . Osteoarthritis of both knees 04/18/2008  . Osteoarthritis involving multiple joints on both sides of body 09/14/2007  . Morbid obesity ([place]) 08/24/2006  . RESTLESS LEGS SYNDROME 08/24/2006  . HYPERTENSION, BENIGN SYSTEMIC 08/24/2006  . RHINITIS, ALLERGIC 08/24/2006  . GASTROESOPHAGEAL REFLUX, NO ESOPHAGITIS 08/24/2006   Past Medical History: Past Medical History:  Diagnosis Date  . Abdominal wall hematoma 03/06/2019  . Acute bronchitis 08/29/2018  . Acute on chronic respiratory failure with hypoxia ([place]) 08/29/2018  . Anxiety   . Asthma   . Chronic lower back pain   . COPD (chronic obstructive pulmonary disease) ([place])   . Depression   . Exposure to COVID-19 virus 11/02/2018  . Fall 02/2019  . Family history of anesthesia complication    "daughter; causes her to pass out afterwards"  .  GERD (gastroesophageal reflux disease)   . History of atrial flutter 06/26/2015  . History of pulmonary embolus (PE) 11/03/2014  . Hypertension   . Hyperthyroidism   . Left medial tibial stress syndrome 12/26/2017  . Lower GI bleed   . Migraine     "monthly" (12/28/2013)  . Non-traumatic rhabdomyolysis 11/03/2014  . Obesity hypoventilation syndrome ([place]) 03/06/2019  . Osteoarthritis    "both knees; back of my neck; right pelvic bone" (12/28/2013)  . Paroxysmal A-fib ([place])   . Pulmonary embolism ([place]) 12/28/2013   "2 clots in each lung"    Past Surgical History: Past Surgical History:  Procedure Laterality Date  . ABDOMINAL HYSTERECTOMY    . APPENDECTOMY    . BREAST CYST EXCISION Right   . DILATION AND CURETTAGE OF UTERUS    . ELECTROPHYSIOLOGIC STUDY N/A 05/27/2015   Procedure: A-Flutter;  Surgeon: Evans Lance, MD;  Location: [facility];  Service: Cardiovascular;  Laterality: N/A;  . EXCISIONAL HEMORRHOIDECTOMY    . NASAL SINUS SURGERY  2007  . THYROIDECTOMY, PARTIAL Right 2005  . TUBAL LIGATION    . WISDOM TOOTH EXTRACTION      Family History: family history includes Asthma in her mother; Breast cancer in her daughter; Heart failure in her mother; Osteoarthritis in her mother.   Social History: Social History   Social History Narrative   Disabled single patient.   Food insecurities   Lack of transportation    Ysidra reports that she quit smoking about 20 years ago. Her smoking use included cigarettes. She has a 5.00 pack-year smoking history. She has never used smokeless tobacco. She reports previous alcohol use. She reports that she does not use drugs.  Allergies and Medications: Allergies  Allergen Reactions  . Caffeine Other (See Comments)    Migraine  . Hydralazine Hcl Other (See Comments)    AKI leading to rhabdo and electrolyte abnormalities  . Hydrocodone Nausea And Vomiting    Headache, also  . Ciprofloxacin Hives and Rash  . Erythromycin Hives and Rash  . Lisinopril Cough  . Oxycodone Nausea And Vomiting and Other (See Comments)    Headaches, also  . Sulfamethoxazole-Trimethoprim Rash  . Tape Rash and Other (See Comments)   No outpatient medications have been marked as taking for the 03/12/20  encounter [facility] Encounter).    Objective: BP 130/80 (BP Location: Right Arm)   Pulse (!) 126   Temp 98 F (36.7 C) (Oral)   Resp 20   Ht 5\' 5"  (1.651 m)   Wt (!) 156.9 kg   SpO2 99%   BMI 57.58 kg/m  Filed Weights   03/12/20 0024  Weight: (!) 156.9 kg   Patient Vitals for the past 24 hrs:  BP Temp Temp src Pulse Resp SpO2 Height Weight  03/12/20 0449 130/80 -- -- (!) 126 20 99 % -- --  03/12/20 0354 -- -- -- -- -- 97 % -- --  03/12/20 0300 133/87 -- -- 88 (!) 28 100 % -- --  03/12/20 0230 129/88 -- -- (!) 113 (!) 21 99 % -- --  03/12/20 0200 (!) 125/104 -- -- (!) 116 (!) 26 100 % -- --  03/12/20 0145 -- -- -- (!) 112 18 98 % -- --  03/12/20 0115 (!) 143/87 -- -- (!) 101 16 99 % -- --  03/12/20 0024 (!) 127/97 98 F (36.7 C) Oral 87 (!) 32 100 % 5\' 5"  (1.651 m) (!) 156.9 kg   Exam: Physical Exam  Constitutional:      Appearance: She is ill-appearing.  HENT:     Head: Normocephalic and atraumatic.  Cardiovascular:     Rate and Rhythm: Tachycardia present. Rhythm irregular.  Pulmonary:     Effort: Tachypnea and respiratory distress present.     Breath sounds: Wheezing present.     Comments: Currently on Bi-Pap Abdominal:     General: Bowel sounds are normal.     Palpations: Abdomen is soft.     Tenderness: There is no abdominal tenderness. There is no guarding.  Musculoskeletal:     Right lower leg: Edema present.     Left lower leg: Edema present.     Comments: Tracing pitting edema in LLE  Neurological:     Mental Status: She is alert.      Labs and Imaging: I have personally reviewed following labs and imaging studies  Recent Labs    03/12/20 0038  WBC 8.1  HGB 10.6*  HCT 36.4  PLT 410*   Recent Labs    03/12/20 0038  NA 141  K 3.8  CL 95*  CO2 36*  BUN 11  CREATININE 0.47  GLUCOSE 101*  CALCIUM 8.8*     DG Chest Port 1 View  Result Date: 03/12/2020 CLINICAL DATA:  Shortness of breath for 2 days, lower extremity swelling EXAM:  PORTABLE CHEST 1 VIEW COMPARISON:  02/06/2020 FINDINGS: Single frontal view of the chest demonstrates a stable enlarged cardiac silhouette. There is chronic elevation of the right hemidiaphragm. Stable hypoventilatory changes are seen at the lung bases, left greater than right. Mild central vascular congestion. No airspace disease, effusion, or pneumothorax. IMPRESSION: 1. Stable bibasilar hypoventilatory changes. 2. Mild central vascular congestion without overt edema. Electronically Signed   By: Randa Ngo M.D.   On: 03/12/2020 00:55     Rise Patience, D.O. 03/12/2020, 5:25 AM PGY-1, [place] Intern pager: [phone], text pages welcome  FPTS Upper-Level Resident Addendum I have independently interviewed and examined the patient. I have discussed the above with the original author and agree with their documentation. My edits for correction/addition/clarification are in - purple. Please see also any attending notes.  [place] Service pager: [phone] (text pages welcome through [facility]ION)  Wilber Oliphant, M.D.  PGY-3 03/12/2020 8:02 AM

## 2020-03-13 ENCOUNTER — Other Ambulatory Visit: Payer: Self-pay | Admitting: Internal Medicine

## 2020-03-13 DIAGNOSIS — I509 Heart failure, unspecified: Secondary | ICD-10-CM

## 2020-03-13 DIAGNOSIS — J441 Chronic obstructive pulmonary disease with (acute) exacerbation: Secondary | ICD-10-CM

## 2020-03-13 LAB — GLUCOSE, CAPILLARY
Glucose-Capillary: 106 mg/dL — ABNORMAL HIGH (ref 70–99)
Glucose-Capillary: 103 mg/dL — ABNORMAL HIGH (ref 70–99)

## 2020-03-13 LAB — BASIC METABOLIC PANEL
Anion gap: 13 (ref 5–15)
BUN: 10 mg/dL (ref 8–23)
CO2: 34 mmol/L — ABNORMAL HIGH (ref 22–32)
Calcium: 8.9 mg/dL (ref 8.9–10.3)
Chloride: 95 mmol/L — ABNORMAL LOW (ref 98–111)
Creatinine, Ser: 0.52 mg/dL (ref 0.44–1.00)
GFR calc Af Amer: >60 mL/min (ref >60)
GFR calc non Af Amer: >60 mL/min (ref >60)
Glucose, Bld: 96 mg/dL (ref 70–99)
Potassium: 3.2 mmol/L — ABNORMAL LOW (ref 3.5–5.1)
Sodium: 142 mmol/L (ref 135–145)

## 2020-03-13 LAB — MAGNESIUM: Magnesium: 1.8 mg/dL (ref 1.7–2.4)

## 2020-03-13 LAB — HIV ANTIBODY (ROUTINE TESTING W REFLEX): HIV Screen 4th Generation wRfx: NONREACTIVE

## 2020-03-13 MED ORDER — ONDANSETRON 4 MG PO TBDP
4.0000 mg | ORAL_TABLET | Freq: Once | ORAL | Status: AC
  Administered 2020-03-13: 4 mg via ORAL
  Filled 2020-03-13: qty 1

## 2020-03-13 MED ORDER — POTASSIUM CHLORIDE CRYS ER 20 MEQ PO TBCR
40.0000 meq | EXTENDED_RELEASE_TABLET | Freq: Two times a day (BID) | ORAL | Status: DC
  Administered 2020-03-13 – 2020-03-16 (×8): 40 meq via ORAL
  Filled 2020-03-13 (×8): qty 2

## 2020-03-13 NOTE — Evaluation (Signed)
Physical Therapy Evaluation Patient Details Name: [name] White MRN: [id number] DOB: [birth year] Today's Date: 03/13/2020   History of Present Illness  64 y.o. female PMH is significant for Paroxysmal A fib, respiratory failure chronically on 6 L [place], OSA. HFpEF , T2DM, MDD, HTN, HLD,  PE, and GERD. Pt came to ED 9/15 with SOB and worsening B LE edema,  placed on BiPAP and admitted for observaton of COPD exacerbation.    Clinical Impression  PTA pt living with niece in single story apartment with level entry. Pt uses RW for household ambulation, able to bathe her front and has HHA who assists with iADLs 4hrs/3day/wk. Pt is current with HHPT. Pt is currently limited in safe mobility by increased O2 demand, and B LE edema, in presence of decreased strength and endurance.  Pt is min A for transfers and short distance ambulation. PT recommends return to HHPT at discharge. Pt is very happy with service she has now. PT will continue to follow acutely.     Follow Up Recommendations Home health PT;Supervision for mobility/OOB    Equipment Recommendations  None recommended by PT    Recommendations for Other Services       Precautions / Restrictions Precautions Precautions: Fall Restrictions Weight Bearing Restrictions: No      Mobility  Bed Mobility               General bed mobility comments: sitting up on straight chair on entry   Transfers Overall transfer level: Needs assistance Equipment used: Rolling walker (2 wheeled) Transfers: Sit to/from Stand Sit to Stand: Min assist         General transfer comment: min A for steadying in standing  Ambulation/Gait Ambulation/Gait assistance: Min assist Gait Distance (Feet): 16 Feet Assistive device: Rolling walker (2 wheeled) Gait Pattern/deviations: Step-through pattern;Decreased step length - right;Decreased step length - left;Wide base of support;Shuffle Gait velocity: slowed Gait velocity interpretation: <1.31 ft/sec,  indicative of household ambulator General Gait Details: min A for steadying with slow, waddling gait, mildly unsteady      Balance Overall balance assessment: Mild deficits observed, not formally tested                                           Pertinent Vitals/Pain Pain Assessment: 0-10 Pain Score: 8  Pain Location: L LE  Pain Descriptors / Indicators: Aching;Sore;Throbbing Pain Intervention(s): Limited activity within patient's tolerance;Monitored during session;Repositioned    Home Living Family/patient expects to be discharged to:: Private residence Living Arrangements: Other relatives (niece) Available Help at Discharge: Family;Available PRN/intermittently;Other (Comment) (Home health aide 4 hrs day/3 day week ) Type of Home: Apartment Home Access: Level entry     Home Layout: One level Home Equipment: Walker - 2 wheels;Walker - 4 wheels;Bedside commode;Tub bench;Wheelchair - manual      Prior Function Level of Independence: Needs assistance   Gait / Transfers Assistance Needed: pt ambulates very short household distances, Advertising account planner for support  ADL's / Homemaking Assistance Needed: niece performs all IADLs and assists with bathing        Hand Dominance   Dominant Hand: Right    Extremity/Trunk Assessment   Upper Extremity Assessment Upper Extremity Assessment: Generalized weakness    Lower Extremity Assessment Lower Extremity Assessment: RLE deficits/detail;LLE deficits/detail RLE Deficits / Details: increased edema limiting ROM, strength grossly 3+/5 RLE Coordination: decreased fine motor LLE Deficits /  Details: increased edema limiting ROM, strength grossly 3+/5 LLE Coordination: decreased fine motor       Communication   Communication: No difficulties  Cognition Arousal/Alertness: Awake/alert Behavior During Therapy: WFL for tasks assessed/performed Overall Cognitive Status: Within Functional Limits for tasks assessed                                         General Comments General comments (skin integrity, edema, etc.): B LE edema, HR 85-145 with mobility, SaO2 on 6L >92%O2 throughout mobility.        Assessment/Plan    PT Assessment Patient needs continued PT services  PT Problem List Decreased strength;Decreased range of motion;Decreased activity tolerance;Decreased mobility;Cardiopulmonary status limiting activity;Obesity;Pain       PT Treatment Interventions DME instruction;Gait training;Functional mobility training;Therapeutic activities;Therapeutic exercise;Balance training;Cognitive remediation;Patient/family education    PT Goals (Current goals can be found in the Care Plan section)  Acute Rehab PT Goals Patient Stated Goal: get to the beach PT Goal Formulation: With patient Time For Goal Achievement: 03/27/20 Potential to Achieve Goals: Good    Frequency Min 3X/week    AM-PAC PT "6 Clicks" Mobility  Outcome Measure Help needed turning from your back to your side while in a flat bed without using bedrails?: None Help needed moving from lying on your back to sitting on the side of a flat bed without using bedrails?: A Little Help needed moving to and from a bed to a chair (including a wheelchair)?: A Little Help needed standing up from a chair using your arms (e.g., wheelchair or bedside chair)?: A Little Help needed to walk in hospital room?: A Little Help needed climbing 3-5 steps with a railing? : A Lot 6 Click Score: 18    End of Session Equipment Utilized During Treatment: Oxygen Activity Tolerance: Patient tolerated treatment well Patient left: in chair;with call bell/phone within reach Nurse Communication: Mobility status PT Visit Diagnosis: Unsteadiness on feet (R26.81);Other abnormalities of gait and mobility (R26.89);Muscle weakness (generalized) (M62.81);Difficulty in walking, not elsewhere classified (R26.2);Pain Pain - Right/Left: Left Pain - part of  body: Leg    Time: 2620-3559 PT Time Calculation (min) (ACUTE ONLY): 31 min   Charges:   PT Evaluation $PT Eval Moderate Complexity: 1 Mod PT Treatments $Gait Training: 8-22 mins        Kyndal Gloster B. Migdalia Dk PT, DPT Acute Rehabilitation Services Pager [phone] Office [phone]   [place] 03/13/2020, 10:00 AM

## 2020-03-13 NOTE — Discharge Summary (Signed)
[facility] Discharge Summary  Patient name: [name] White Medical record number: [id number] Date of birth: 04/10/1956 Age: 64 y.o. Gender: female Date of Admission: 03/12/2020  Date of Discharge: 03/16/2020 Admitting Physician: Rise Patience, DO  Primary Care Provider: Richarda Osmond, DO Consultants: none  Indication for Hospitalization: Acute on chronic respiratory failure with hypoxia  Discharge Diagnoses/Problem List:  Acute on chronic respiratory failure with hypoxia Atrial fibrillation HFpEF   Disposition: Discharge continuing with HHA and [facility] PT   Discharge Condition: Stable  Discharge Exam: Physical Exam Vitals and nursing note reviewed.  Constitutional:      General: She is not in acute distress.    Appearance: She is well-developed. She is obese. She is not ill-appearing or toxic-appearing.  HENT:     Head: Normocephalic and atraumatic.  Cardiovascular:     Rate and Rhythm: Normal rate and regular rhythm.     Pulses: Normal pulses.          Radial pulses are 2+ on the right side and 2+ on the left side.       Dorsalis pedis pulses are 2+ on the right side and 2+ on the left side.     Heart sounds: Normal heart sounds, S1 normal and S2 normal. No murmur heard.   Pulmonary:     Effort: Pulmonary effort is normal. No respiratory distress.     Breath sounds: Normal breath sounds. No wheezing.  Musculoskeletal:     Right lower leg: No edema.     Left lower leg: No edema.  Neurological:     Mental Status: She is alert. Mental status is at baseline.  Psychiatric:        Mood and Affect: Mood normal.        Behavior: Behavior normal.        Thought Content: Thought content normal.      Brief Hospital Course:  1. Acute on chronic respiratory failure with hypoxia: Multifactorial in setting of OHS, diastolic HF, and OSA. Had 2 days worsening SOB before being brought by EMS to the hospital. On admission her ABG was Arterial Blood  Gas result:  pO2 91.2; pCO2 87.0; pH 7.286;  HCO3 40.1, %O2 Sat 96. She was started on Bi-PAP, given a single dose of IV lasix and then restarted on her home oral dose. She continued to improve with her hypercapnia down-trending appropriately until discharge with repeat ABG notable for pO2 128; pCO2 73.1; pH 7.371;  HCO3 41.3, %O2 Sat 98.3. She was stable on her home oxygen by day of discharge. She was instructed to continue her home medications as prescribed. She was discharged home with her niece with instruction to follow up this week.   2. Atrial fibrillation: Patient has paroxysmal afib at baseline. EKG noted for afib and borderline QT prolongation. She remained hemodynamically stable with HR 80-90's throughout most of the admission. Continued home medications with no changing in baseline.  3. HFpEF: History of HF with most recent echo EF (5/31) 60-65%, severe LVH, moderate RVH, LA mildly dilated, no mitral or aortic stenosis. Patient takes furosemide 60mg  daily. Was given a single dose of 40 mg IV Lasix. Was then restarted on home dose of 60 mg oral Lasix.   Issues for Follow Up:  1. Acute on chronic respiratory failure with hypoxia: Ensure that Trilogy setting are optimized. Possible readjustment of Lasix dosing to prevent fluid overload. Recommended outpatient pulmonology follow up. She opted to schedule her own appointment.  2.  Atrial fibrillation: Continue monitoring for changes.  3. HFpEF: Recheck fluid status for any changes in lasix required.  Significant Procedures: None  Significant Labs and Imaging:  Recent Labs  Lab 03/12/20 0038  WBC 8.1  HGB 10.6*  HCT 36.4  PLT 410*   Recent Labs  Lab 03/12/20 0038 03/12/20 0038 03/12/20 0847 03/12/20 1714 03/12/20 1714 03/13/20 0406 03/13/20 0406 03/14/20 0932 03/15/20 0207  NA 141  --   --  143  --  142  --  143 139  K 3.8   < >  --  3.2*   < > 3.2*   < > 4.4 4.2  CL 95*  --   --  95*  --  95*  --  96* 94*  CO2 36*  --   --   35*  --  34*  --  37* 36*  GLUCOSE 101*  --   --  182*  --  96  --  99 153*  BUN 11  --   --  6*  --  10  --  23 23  CREATININE 0.47  --   --  0.65  --  0.52  --  0.75 0.61  CALCIUM 8.8*  --   --  9.1  --  8.9  --  8.2* 8.1*  MG  --   --  1.7  --   --  1.8  --  2.1 2.0  PHOS  --   --   --   --   --   --   --  5.1*  --   ALKPHOS 96  --   --   --   --   --   --   --  82  AST 28  --   --   --   --   --   --   --  21  ALT 17  --   --   --   --   --   --   --  17  ALBUMIN 3.1*  --   --   --   --   --   --  3.1* 2.9*   < > = values in this interval not displayed.   ABG    Component Value Date/Time   PHART 7.371 03/16/2020 0455   PCO2ART 73.1 (HH) 03/16/2020 0455   PO2ART 128 (H) 03/16/2020 0455   HCO3 41.3 (H) 03/16/2020 0455   TCO2 46 (H) 02/06/2020 1454   O2SAT 98.3 03/16/2020 0455    Mag: 2.0 PR-INR: 12.9 seconds   Results/Tests Pending at Time of Discharge: None  Discharge Medications:  Allergies as of 03/16/2020      Reactions   Caffeine Other (See Comments)   Migraine   Hydralazine Hcl Other (See Comments)   AKI leading to rhabdo and electrolyte abnormalities   Hydrocodone Nausea And Vomiting   Headache, also   Ciprofloxacin Hives, Rash   Erythromycin Hives, Rash   Lisinopril Cough   Oxycodone Nausea And Vomiting, Other (See Comments)   Headaches, also   Sulfamethoxazole-trimethoprim Rash   Tape Rash, Other (See Comments)      Medication List    STOP taking these medications   famotidine 20 MG tablet Commonly known as: PEPCID   mometasone-formoterol 100-5 MCG/ACT Aero Commonly known as: DULERA     TAKE these medications   acetaminophen 325 MG tablet Commonly known as: TYLENOL Take 2 tablets (650 mg total) by mouth every 6 (six) hours as  needed for mild pain.   apixaban 5 MG Tabs tablet Commonly known as: ELIQUIS Take 1 tablet (5 mg total) by mouth 2 (two) times daily.   atorvastatin 40 MG tablet Commonly known as: LIPITOR Take 1 tablet (40 mg  total) by mouth daily. What changed: when to take this   azelastine 0.05 % ophthalmic solution Commonly known as: OPTIVAR INSTILL 1 DROP IN BOTH EYES TWICE DAILY AS NEEDED FOR ITCHY EYES What changed: See the new instructions.   azelastine 0.1 % nasal spray Commonly known as: ASTELIN Place 2 sprays into both nostrils 2 (two) times daily. Use in each nostril as directed What changed:   when to take this  additional instructions   cetirizine 10 MG tablet Commonly known as: ZYRTEC TAKE 1 TABLET(10 MG) BY MOUTH DAILY What changed: See the new instructions.   cyclobenzaprine 5 MG tablet Commonly known as: FLEXERIL Take 1 tablet (5 mg total) by mouth 2 (two) times daily as needed for muscle spasms.   diclofenac sodium 1 % Gel Commonly known as: VOLTAREN APPLY 2 GRAMS EXTERNALLY TO THE AFFECTED AREA FOUR TIMES DAILY What changed: See the new instructions.   diltiazem 120 MG 24 hr capsule Commonly known as: CARDIZEM CD Take 1 capsule (120 mg total) by mouth daily. Please keep upcoming appt for future refills   flecainide 100 MG tablet Commonly known as: TAMBOCOR Take 1 tablet (100 mg total) by mouth 2 (two) times daily. Please keep upcoming appt for future refills   fluticasone 50 MCG/ACT nasal spray Commonly known as: FLONASE Place 2 sprays into both nostrils daily as needed for allergies or rhinitis.   furosemide 40 MG tablet Commonly known as: LASIX TAKE 1 AND 1/2 TABLETS(60 MG) BY MOUTH DAILY What changed: See the new instructions.   gabapentin 100 MG capsule Commonly known as: NEURONTIN Take 2 capsules (200 mg total) by mouth 3 (three) times daily.   guaiFENesin 600 MG 12 hr tablet Commonly known as: MUCINEX Take 1 tablet (600 mg total) by mouth 2 (two) times daily as needed for cough.   hydrOXYzine 25 MG capsule Commonly known as: VISTARIL TAKE 1 CAPSULE(25 MG) BY MOUTH DAILY AS NEEDED FOR ANXIETY What changed: See the new instructions.    ipratropium-albuterol 0.5-2.5 (3) MG/3ML Soln Commonly known as: DUONEB Take 3 mLs by nebulization every 6 (six) hours as needed (shortness of breath, wheezing).   melatonin 3 MG Tabs tablet Take 1 tablet (3 mg total) by mouth at bedtime as needed (sleep). What changed: when to take this   metoprolol tartrate 25 MG tablet Commonly known as: LOPRESSOR TAKE 1/2 TABLET(12.5 MG) BY MOUTH TWICE DAILY What changed: See the new instructions.   multivitamin tablet Take 1 tablet by mouth daily.   omeprazole 20 MG capsule Commonly known as: PRILOSEC Take 1 capsule (20 mg total) by mouth daily. What changed: when to take this   OXYGEN Inhale 6-7 L/min into the lungs continuous.   potassium chloride 10 MEQ tablet Commonly known as: KLOR-CON TAKE 2 TABLETS(20 MEQ) BY MOUTH DAILY What changed: See the new instructions.   PRESCRIPTION MEDICATION See admin instructions. Trilogy [employer]-  At bedtime and during any time of rest   venlafaxine 37.5 MG tablet Commonly known as: EFFEXOR Take 1 tablet (37.5 mg total) by mouth daily with breakfast. Take with a 150 mg ER capsule every morning What changed: additional instructions   venlafaxine XR 150 MG 24 hr capsule Commonly known as: EFFEXOR-XR Take 1  capsule (150 mg total) by mouth daily with breakfast. Take with a 37.5 mg tablet every morning What changed: additional instructions   Ventolin HFA 108 (90 Base) MCG/ACT inhaler Generic drug: albuterol INHALE 2 PUFFS INTO THE LUNGS EVERY 6 HOURS AS NEEDED FOR WHEEZING OR SHORTNESS OF BREATH What changed:   how much to take  how to take this  when to take this  reasons to take this  additional instructions       Discharge Instructions: Please refer to Patient Instructions section of EMR for full details.  Patient was counseled important signs and symptoms that should prompt return to medical care, changes in medications, dietary instructions, activity  restrictions, and follow up appointments.   Follow-Up Appointments:  Follow-up Information    [facility] Follow up.   Why: You will be contacted by a representative from [place] to arrange start of care.  Contact information: [phone]             Briant Cedar, MD 03/16/2020, 5:16 PM PGY-1, [place], [place], PGY3 03/16/2020 5:21 PM

## 2020-03-13 NOTE — Progress Notes (Signed)
   03/13/20 0009  Clinical Encounter Type  Visited With Patient  Visit Type Initial  Referral From Nurse  Consult/Referral To Chaplain  Nurse request spiritual support for patient. Patient stated she was tired of being sick. Stated she has been admitted twenty five times. Patient stated she does not like looking in the mirror. Patient stated her illness has changed her appearance. She wants to be well enough to move to the beach in [place]. Patient stated her son died while she was in the hospital. Patient stated her sister provides support, however; conversations are often negative. Patient was waiting on machine so she can breath better and be able to sleep otherwise sitting on the side of the bed is the most comfortable position. Patient stated she has not been to the beach since 2004. Encouraged her to reflect on those memories and what new memories she would like to create. Offered prayer and informed will have day chaplain follow up with her.This note was prepared by Jeanine Luz, M.Div..  For questions please contact by phone [phone].

## 2020-03-13 NOTE — Progress Notes (Signed)
Attempted to contact niece to discuss discharge plans. Was unable to reach her. Will attempt again later.

## 2020-03-13 NOTE — Progress Notes (Signed)
Family Medicine Teaching Service Daily Progress Note Intern Pager: [phone]  Patient name: [name] White Medical record number: [id number] Date of birth: 08-Sep-1955 Age: 64 y.o. Gender: female  Primary Care Provider: Richarda Osmond, DO Consultants: None Code Status: DNR  Pt Overview and Major Events to Date:  Admitted on 9/16  Assessment and Plan:  [name] White a 64 y.o.femalewho presented with SOBand was admitted for CHF exacerbation.PMH significant for paroxysmal Afib, chronic respiratory failure on 6L [place], OSA, HFpEF, Predoabetic, MDD, HTN, HLD, PE, and GERD    Acute on chronic respiratory failure with hypoxia Reportsworsening SoB x2 days with dry cough and dyspnea at rest, andworsening BLEE; on 6L O2baseline at home. Patient reports compliance with home furosemide.PatientrequiringBi-Pap with wheezes and frequent cough with sitting upright. Patient has frequent admissions for respiratory failure likely attributed toOHS. In2020,patient PFTs indicated she is unlikely to have COPD nor asthma.  Most likely an OSA -Continuous cardiac monitoring -RepeatTroponin -Wean off Bi-pap as able   Atrial fibrillation Patient has paroxysmal afib at baseline. EKG noted forafib and borderline QT prolongation. Home medications include diltiazem, flecainide, metoprolol tartrate, and apixaban. On admission Mag is 1.7, in consult with Pharmacy, Flecainide goal is 2.0. Repletion ordered yesterday and Mag today is 1.8. Will reorder Mag tomorrow to trend. -Start Mag-Ox 400 mg q8 for 2 doses -Recheck Mag in AM -Continue Apixaban 5 mg BID -Continue Diltiazem 120 mg daily -Continue Flecainide 100 mg BID -Continue Lopressor 12.5 mg BID -Continuous telemetry   HFpEF History of HF with most recent echo EF(5/31) 60-65%, severe LVH, moderate RVH, LA mildly dilated, no mitral or aortic stenosis. Edema has resolved. Will restart home dosing of lasix. -Restart Lasix 60 mg  oral Daily -Low sodium diet with 1512mL fluid restriction -Monitor I's/O's   HTN Admission BP127/97, most recent BP 127/94. Home medications include diltiazem and metoprolol. -Continue Diltiazem 120 mg daily -Continue Lopressor 12.5 mg BID -Continue to monitor    HLD Last lipid panel: chol 197, TG 44, HDL 82, LDL 106. Home meds include atorvastatin 40mg  daily. -Continue Atorvastatin 40 mg daily   GERD Patient history of GERD with home meds famotidine and protonix -Continue Pepcid 20 mg daily -Continue Protonix 40 mg daily   Prediatbetes Patient A1C6.4 pm 8/13. CBG on admission was101. Patient diet controlled at home. - Continue to monitor   Hx of PE Patient has a hx of PE with complaints of chronic leg swelling and pain L>R.  -Continue Eliquis 5 mg BID   Restless Leg Syndrome Hx of restless leg syndrome. Home meds include gabapentin, voltaren gel, and tylenol. -Continue Gabapentin 200 mg TID   MDD History of MDD. Home meds include venlafaxine XR and hydroxyzine.Hx of [place] admissions. -Continue Venlafaxine XR 150 mg daily with breakfast -Continue Venlafaxine 37.5 mg daily with breakfast  FEN/GI: Low sodium diet with 1550mL fluid restriction PPx: Eliquis  Disposition: Progressive with Cardiac Monitoring  Prior to Admission Living Arrangement: Home Anticipated Discharge Location: Home Barriers to Discharge: Respiratory Status Anticipated discharge in approximately 0-1 day(s).   Subjective:  Interviewed patient at bedside.  She reports that her breathing has improved but that the Bi-PAP machine is very uncomfortable and that she did not have restful sleep. Discussed that she might be able to discharge home later today pending PT evaluation.  Objective: Temp:  [98.1 F (36.7 C)-98.2 F (36.8 C)] 98.1 F (36.7 C) (09/17 0522) Pulse Rate:  [44-127] 88 (09/17 0523) Resp:  [11-32] 20 (09/17 0522) BP: (113-158)/(74-138) 127/94 (  09/17  0522) SpO2:  [96 %-100 %] 96 % (09/17 0522) FiO2 (%):  [40 %] 40 % (09/17 0059) Weight:  [158.8 kg-159.1 kg] 158.8 kg (09/17 0618) Physical Exam:  Physical Exam Constitutional:      Appearance: She is well-developed. She is obese.  Neurological:     Mental Status: She is alert.      Laboratory: Recent Labs  Lab 03/12/20 0038  WBC 8.1  HGB 10.6*  HCT 36.4  PLT 410*   Recent Labs  Lab 03/12/20 0038 03/12/20 1714 03/13/20 0406  NA 141 143 142  K 3.8 3.2* 3.2*  CL 95* 95* 95*  CO2 36* 35* 34*  BUN 11 6* 10  CREATININE 0.47 0.65 0.52  CALCIUM 8.8* 9.1 8.9  PROT 7.0  --   --   BILITOT 0.8  --   --   ALKPHOS 96  --   --   ALT 17  --   --   AST 28  --   --   GLUCOSE 101* 182* 96    Mag: 1.8 PT/INR: 12.9 (WNL)  Imaging/Diagnostic Tests:  DG Chest Portable 1 View: Stable bibasilar hypoventilatory changes. Mild central vascular congestion without overt edema.  Briant Cedar, MD 03/13/2020, 7:33 AM PGY-1, [place] Intern pager: [phone], text pages welcome

## 2020-03-13 NOTE — Progress Notes (Signed)
   03/13/20 1100  Clinical Encounter Type  Visited With Patient  Visit Type Follow-up  Referral From Chaplain  Consult/Referral To Chaplain  Spiritual Encounters  Spiritual Needs Prayer;Emotional  Stress Factors  Patient Stress Factors Exhausted;Health changes  Family Stress Factors None identified  Chaplain visited with patient at request of night Chaplain. Patient was excited when she found out that this Chaplain was from [place] - her home. She has not been home since 2002 and passionately wants to return. Chaplain and patient talked about [place] and places she remembered and also about all the things she needs to accomplish before she can reach the goal of going back to her home. Chaplain asked her to keep thinking about her goal and how happy she will be when she reaches it. Chaplain offered prayer and told patient she would bring her a magazine about [employer].

## 2020-03-13 NOTE — Progress Notes (Signed)
Nutrition Education Note  RD consulted for nutrition education regarding low sodium diet.  RD provided "Low Sodium Nutrition Therapy" handout from the [employer]. Reviewed patient's dietary recall. Provided examples on ways to decrease sodium intake in diet. Discouraged intake of processed foods and use of salt shaker. Encouraged fresh fruits and vegetables as well as whole grain sources of carbohydrates to maximize fiber intake.   RD discussed why it is important for patient to adhere to diet recommendations, and emphasized the role of fluids, foods to avoid, and importance of weighing self daily. Teach back method used.  Expect good compliance.  Body mass index is 58.24 kg/m. Pt meets criteria for class 3, extreme/morbid obesity based on current BMI.  Current diet order is 2 gm sodium, patient is consuming approximately 100% of meals at this time. Labs and medications reviewed. No further nutrition interventions warranted at this time. RD contact information provided. If additional nutrition issues arise, please re-consult RD.   [name] White, RD, LDN, CNSC Please refer to [facility] for contact information.

## 2020-03-13 NOTE — Progress Notes (Signed)
Pt placed on BIPAP V60 on settings of 12/5 BUR 8 40%. Pt respiratory status is stable at this time w/no distress noted. RT will continue to monitor.

## 2020-03-14 DIAGNOSIS — Z90710 Acquired absence of both cervix and uterus: Secondary | ICD-10-CM | POA: Diagnosis not present

## 2020-03-14 DIAGNOSIS — G2581 Restless legs syndrome: Secondary | ICD-10-CM | POA: Diagnosis present

## 2020-03-14 DIAGNOSIS — Z7901 Long term (current) use of anticoagulants: Secondary | ICD-10-CM | POA: Diagnosis not present

## 2020-03-14 DIAGNOSIS — Z87891 Personal history of nicotine dependence: Secondary | ICD-10-CM | POA: Diagnosis not present

## 2020-03-14 DIAGNOSIS — E876 Hypokalemia: Secondary | ICD-10-CM | POA: Diagnosis present

## 2020-03-14 DIAGNOSIS — I110 Hypertensive heart disease with heart failure: Secondary | ICD-10-CM | POA: Diagnosis present

## 2020-03-14 DIAGNOSIS — J9621 Acute and chronic respiratory failure with hypoxia: Secondary | ICD-10-CM

## 2020-03-14 DIAGNOSIS — I509 Heart failure, unspecified: Secondary | ICD-10-CM | POA: Diagnosis not present

## 2020-03-14 DIAGNOSIS — I5033 Acute on chronic diastolic (congestive) heart failure: Secondary | ICD-10-CM | POA: Diagnosis present

## 2020-03-14 DIAGNOSIS — E1169 Type 2 diabetes mellitus with other specified complication: Secondary | ICD-10-CM | POA: Diagnosis present

## 2020-03-14 DIAGNOSIS — J9622 Acute and chronic respiratory failure with hypercapnia: Secondary | ICD-10-CM

## 2020-03-14 DIAGNOSIS — D6859 Other primary thrombophilia: Secondary | ICD-10-CM | POA: Diagnosis present

## 2020-03-14 DIAGNOSIS — D638 Anemia in other chronic diseases classified elsewhere: Secondary | ICD-10-CM | POA: Diagnosis present

## 2020-03-14 DIAGNOSIS — Z79899 Other long term (current) drug therapy: Secondary | ICD-10-CM | POA: Diagnosis not present

## 2020-03-14 DIAGNOSIS — K219 Gastro-esophageal reflux disease without esophagitis: Secondary | ICD-10-CM | POA: Diagnosis present

## 2020-03-14 DIAGNOSIS — Z66 Do not resuscitate: Secondary | ICD-10-CM | POA: Diagnosis present

## 2020-03-14 DIAGNOSIS — J441 Chronic obstructive pulmonary disease with (acute) exacerbation: Secondary | ICD-10-CM | POA: Diagnosis present

## 2020-03-14 DIAGNOSIS — I480 Paroxysmal atrial fibrillation: Secondary | ICD-10-CM | POA: Diagnosis present

## 2020-03-14 DIAGNOSIS — Z20822 Contact with and (suspected) exposure to covid-19: Secondary | ICD-10-CM | POA: Diagnosis present

## 2020-03-14 DIAGNOSIS — E662 Morbid (severe) obesity with alveolar hypoventilation: Secondary | ICD-10-CM | POA: Diagnosis present

## 2020-03-14 DIAGNOSIS — Z6841 Body Mass Index (BMI) 40.0 and over, adult: Secondary | ICD-10-CM | POA: Diagnosis not present

## 2020-03-14 DIAGNOSIS — E785 Hyperlipidemia, unspecified: Secondary | ICD-10-CM | POA: Diagnosis present

## 2020-03-14 DIAGNOSIS — Z86711 Personal history of pulmonary embolism: Secondary | ICD-10-CM | POA: Diagnosis not present

## 2020-03-14 DIAGNOSIS — F329 Major depressive disorder, single episode, unspecified: Secondary | ICD-10-CM | POA: Diagnosis present

## 2020-03-14 DIAGNOSIS — I152 Hypertension secondary to endocrine disorders: Secondary | ICD-10-CM | POA: Diagnosis present

## 2020-03-14 LAB — BLOOD GAS, ARTERIAL
Acid-Base Excess: 13.2 mmol/L — ABNORMAL HIGH (ref 0.0–2.0)
Acid-Base Excess: 13.2 mmol/L — ABNORMAL HIGH (ref 0.0–2.0)
Bicarbonate: 40.1 mmol/L — ABNORMAL HIGH (ref 20.0–28.0)
Bicarbonate: 39.9 mmol/L — ABNORMAL HIGH (ref 20.0–28.0)
Drawn by: 24686
Drawn by: 24686
FIO2: 40
FIO2: 40
O2 Saturation: 96 %
O2 Saturation: 94.8 %
Patient temperature: 37
Patient temperature: 37
pCO2 arterial: 87 mmHg (ref 32.0–48.0)
pCO2 arterial: 83.5 mmHg (ref 32.0–48.0)
pH, Arterial: 7.286 — ABNORMAL LOW (ref 7.350–7.450)
pH, Arterial: 7.301 — ABNORMAL LOW (ref 7.350–7.450)
pO2, Arterial: 91.2 mmHg (ref 83.0–108.0)
pO2, Arterial: 81.7 mmHg — ABNORMAL LOW (ref 83.0–108.0)

## 2020-03-14 LAB — RENAL FUNCTION PANEL
Albumin: 3.1 g/dL — ABNORMAL LOW (ref 3.5–5.0)
Anion gap: 10 (ref 5–15)
BUN: 23 mg/dL (ref 8–23)
CO2: 37 mmol/L — ABNORMAL HIGH (ref 22–32)
Calcium: 8.2 mg/dL — ABNORMAL LOW (ref 8.9–10.3)
Chloride: 96 mmol/L — ABNORMAL LOW (ref 98–111)
Creatinine, Ser: 0.75 mg/dL (ref 0.44–1.00)
GFR calc Af Amer: >60 mL/min (ref >60)
GFR calc non Af Amer: >60 mL/min (ref >60)
Glucose, Bld: 99 mg/dL (ref 70–99)
Phosphorus: 5.1 mg/dL — ABNORMAL HIGH (ref 2.5–4.6)
Potassium: 4.4 mmol/L (ref 3.5–5.1)
Sodium: 143 mmol/L (ref 135–145)

## 2020-03-14 LAB — GLUCOSE, CAPILLARY: Glucose-Capillary: 99 mg/dL (ref 70–99)

## 2020-03-14 LAB — MAGNESIUM: Magnesium: 2.1 mg/dL (ref 1.7–2.4)

## 2020-03-14 MED ORDER — ALUM & MAG HYDROXIDE-SIMETH 200-200-20 MG/5ML PO SUSP
30.0000 mL | Freq: Four times a day (QID) | ORAL | Status: DC | PRN
  Administered 2020-03-14 – 2020-03-15 (×3): 30 mL via ORAL
  Filled 2020-03-14 (×3): qty 30

## 2020-03-14 NOTE — Progress Notes (Signed)
Increased BiPAP setting to correct ABG results, MD and RN aware

## 2020-03-14 NOTE — Progress Notes (Addendum)
Family Medicine Teaching Service Daily Progress Note Intern Pager: [phone]  Patient name: [name] White Medical record number: [id number] Date of birth: November 30, 1955 Age: 64 y.o. Gender: female  Primary Care Provider: Richarda Osmond, DO Consultants: None Code Status: DNR  Pt Overview and Major Events to Date:  9/16 - admitted to FPTS  Assessment and Plan: [name] White a 64 y.o.femalewho presented with SOBand was admitted for CHF exacerbation.PMH significant for paroxysmal Afib, chronic respiratory failure on 6L [place], OSA, HFpEF,Predoabetic, MDD, HTN, HLD, PE, and GERD   Acute on chronic respiratory failure with hypoxia Stable. Continues on 6L O2 during the day and BiPAP PRN/qHS. Baseline O2 requirement is 6L at home. She did have increased somnolence overnight likely 2/2 to CO2 retention. ABG this AM notable for pH 7.286, pCO2 87, Bicarb 40, pO2 96. She was speaking in full sentences on bedside commode this AM on home 6L with plan to return to BiPAP when finished. - Continue home O2 and BiPAP prn - restart BiPAP now, respiratory team in room to help adjust settings. Plan for repeat ABG in afternoon to ensure improvement. Likely discharge home tomorrow to ensure continued stability.   Hypokalemia: Resolved. K 4.4 today - replete as needed  Atrial fibrillation Patient has paroxysmal afib at baseline. HR 70-90's overnight. Home medications include diltiazem, flecainide, metoprolol tartrate, and apixaban. -ContinueApixaban5 mg BID -Continue Diltiazem120 mg daily -Continue Flecainide 100 mg BID -Continue Lopressor 12.5 mg BID -Continuous telemetry  HFpEF Most recent echo EF(5/31) 60-65%, severe LVH, moderate RVH, LA mildly dilated, no mitral or aortic stenosis.Edema has resolved. Home meds: Lasix 60mg  QD, Metoprolol 12.5mg  BID - continue home meds  -Low sodium diet with 1574mL fluid restriction -Monitor I's/O's  HTN Remains normotensive. Home medications  include diltiazem and metoprolol. -Continue Diltiazem120 mg daily -Continue Lopressor 12.5 mg BID -Continue to monitor   HLD Last lipid panel: chol 197, TG 44, HDL 82, LDL 106. Home meds include atorvastatin 40mg  daily. -ContinueAtorvastatin40 mg daily  GERD Patient history of GERD with home meds famotidine and protonix -Continue Pepcid 20 mg daily -Continue Protonix 40 mg daily  Prediatbetes Patient A1C6.4 pm 8/13. Patient diet controlled at home. - Continue to monitor  Hx of PE Patient has a hx of PE with complaints of chronic leg swelling and pain L>R.  -Continue Eliquis5 mg BID  RestlessLegSyndrome Hx of restless leg syndrome. Home meds include gabapentin, voltaren gel, and tylenol. -ContinueGabapentin 200 mg TID  MDD History of MDD. Home meds include venlafaxine XR and hydroxyzine.Hx of [place] admissions. -Continue Venlafaxine XR 150 mg daily with breakfast -Continue Venlafaxine 37.5 mg daily with breakfast  FEN/GI: Low sodium diet with 1567mL fluid restriction PPx: Eliquis  Disposition: Progressive with Cardiac Monitoring, expect d/c 9/18 or 9/19  Prior to Admission Living Arrangement: Home Anticipated Discharge Location: Home Barriers to Discharge: Respiratory Status Anticipated discharge in approximately 0-1 day(s).   Subjective:  Patient notes that she does not feel well this morning. Thinks taking her medications on an empty stomach has caused her some nausea. She was using the bathroom during exam. Breathing comfortably on her home 6L on the bedside commode. Plan for her to return to bed and restart BiPAP. She notes that she does not feel well enough to go home today. Her niece will be home tomorrow and feels she would be better to go tomorrow.  Objective: Temp:  [97.8 F (36.6 C)-98.6 F (37 C)] 98.4 F (36.9 C) (09/18 0624) Pulse Rate:  [74-108] 91 (09/18  1010) Resp:  [19-25] 19 (09/18 0624) BP: (117-148)/(61-85) 117/61 (09/18  1004) SpO2:  [93 %-100 %] 99 % (09/18 0833) Physical Exam: General: obese older woman, sitting comfortably on bedside commode, well nourished, well developed, in no acute distress with non-toxic appearance Resp: breathing comfortably on home 6L O2, speaking in full sentences Abdomen: soft, tender in epigastric area, non-distended Skin: warm, dry Neuro: Alert and oriented, speech normal  Laboratory: Recent Labs  Lab 03/12/20 0038  WBC 8.1  HGB 10.6*  HCT 36.4  PLT 410*   Recent Labs  Lab 03/12/20 0038 03/12/20 0038 03/12/20 1714 03/13/20 0406 03/14/20 0932  NA 141   < > 143 142 143  K 3.8   < > 3.2* 3.2* 4.4  CL 95*   < > 95* 95* 96*  CO2 36*   < > 35* 34* 37*  BUN 11   < > 6* 10 23  CREATININE 0.47   < > 0.65 0.52 0.75  CALCIUM 8.8*   < > 9.1 8.9 8.2*  PROT 7.0  --   --   --   --   BILITOT 0.8  --   --   --   --   ALKPHOS 96  --   --   --   --   ALT 17  --   --   --   --   AST 28  --   --   --   --   GLUCOSE 101*   < > 182* 96 99   < > = values in this interval not displayed.    PT/INR: 12.9/ 1.0 HIV NR  Imaging/Diagnostic Tests: No results found.   Mina Marble [place], DO 03/14/2020, 12:11 PM PGY-3, [place] Intern pager: [phone], text pages welcome

## 2020-03-14 NOTE — Progress Notes (Signed)
Dr. Tarry Kos updated with PCO2 level at 87. New order received to keep pt on BiPAP. RT Kal was also updated,  RT will adjust setting accordingly based on latest PCo2 result.

## 2020-03-14 NOTE — Progress Notes (Signed)
Pt PCO2 level at 83.5 called in by [facility]. Dr. Tarry Kos and RT updated. Continue BiPAP as ordered per RT and MD.

## 2020-03-15 LAB — COMPREHENSIVE METABOLIC PANEL
ALT: 17 U/L (ref 0–44)
AST: 21 U/L (ref 15–41)
Albumin: 2.9 g/dL — ABNORMAL LOW (ref 3.5–5.0)
Alkaline Phosphatase: 82 U/L (ref 38–126)
Anion gap: 9 (ref 5–15)
BUN: 23 mg/dL (ref 8–23)
CO2: 36 mmol/L — ABNORMAL HIGH (ref 22–32)
Calcium: 8.1 mg/dL — ABNORMAL LOW (ref 8.9–10.3)
Chloride: 94 mmol/L — ABNORMAL LOW (ref 98–111)
Creatinine, Ser: 0.61 mg/dL (ref 0.44–1.00)
GFR calc Af Amer: >60 mL/min (ref >60)
GFR calc non Af Amer: >60 mL/min (ref >60)
Glucose, Bld: 153 mg/dL — ABNORMAL HIGH (ref 70–99)
Potassium: 4.2 mmol/L (ref 3.5–5.1)
Sodium: 139 mmol/L (ref 135–145)
Total Bilirubin: 0.4 mg/dL (ref 0.3–1.2)
Total Protein: 6.9 g/dL (ref 6.5–8.1)

## 2020-03-15 LAB — MAGNESIUM: Magnesium: 2 mg/dL (ref 1.7–2.4)

## 2020-03-15 LAB — BLOOD GAS, ARTERIAL
Acid-Base Excess: 14.8 mmol/L — ABNORMAL HIGH (ref 0.0–2.0)
Bicarbonate: 41.2 mmol/L — ABNORMAL HIGH (ref 20.0–28.0)
Drawn by: 42624
FIO2: 40
O2 Saturation: 95.8 %
Patient temperature: 36.7
pCO2 arterial: 78.3 mmHg (ref 32.0–48.0)
pH, Arterial: 7.339 — ABNORMAL LOW (ref 7.350–7.450)
pO2, Arterial: 103 mmHg (ref 83.0–108.0)

## 2020-03-15 NOTE — Progress Notes (Signed)
Family Medicine Teaching Service Daily Progress Note Intern Pager: [phone]  Patient name: [name] White Medical record number: [id number] Date of birth: 29-Dec-1955 Age: 64 y.o. Gender: female  Primary Care Provider: Richarda Osmond, DO Consultants: None Code Status: DNR  Pt Overview and Major Events to Date:  Admitted on 9/16  Assessment and Plan:  [name] a 64 y.o.femalewho presented with SOBand was admitted for CHF exacerbation.PMH significant for paroxysmal Afib, chronic respiratory failure on 6L [place], OSA, HFpEF,Predoabetic, MDD, HTN, HLD, PE, and GERD    Acute on chronic respiratory failure with hypoxia Reportsworsening SoB x2 days with dry cough and dyspnea at rest, andworsening BLEE; on 6L O2baseline at home. ABG yesterday was pO2 81.7; pCO2 83.5; pH 7.301;  HCO3 39.9, %O2 Sat 94.8. -Continuous cardiac monitoring -Wean off Bi-pap as able   Atrial fibrillation Patient has paroxysmal afib at baseline. EKG noted forafib and borderline QT prolongation. Home medications include diltiazem, flecainide, metoprolol tartrate, and apixaban.Mag this morning is 2.0. -Start Mag-Ox 400 mg q8 for 2 doses -ContinueApixaban5 mg BID -Continue Diltiazem120 mg daily -Continue Flecainide 100 mg BID -Continue Lopressor 12.5 mg BID -Continuous telemetry   HFpEF History of HF with most recent echo EF(5/31) 60-65%, severe LVH, moderate RVH, LA mildly dilated, no mitral or aortic stenosis.Edema has resolved. Will restart home dosing of lasix. -Restart Lasix 60 mg oral Daily -Low sodium diet with 1576mL fluid restriction -Monitor I's/O's   HTN Admission BP127/97, most recent BP 127/94. Home medications include diltiazem and metoprolol. -Continue Diltiazem120 mg daily -Continue Lopressor 12.5 mg BID -Continue to monitor    HLD Last lipid panel: chol 197, TG 44, HDL 82, LDL 106. Home meds include atorvastatin 40mg   daily. -ContinueAtorvastatin40 mg daily   GERD Patient history of GERD with home meds famotidine and protonix -Continue Pepcid 20 mg daily -Continue Protonix 40 mg daily   Prediatbetes Patient A1C6.4 pm 8/13. CBG on admission was101. Patient diet controlled at home. - Continue to monitor   Hx of PE Patient has a hx of PE with complaints of chronic leg swelling and pain L>R.  -Continue Eliquis5 mg BID   RestlessLegSyndrome Hx of restless leg syndrome. Home meds include gabapentin, voltaren gel, and tylenol. -ContinueGabapentin 200 mg TID   MDD History of MDD. Home meds include venlafaxine XR and hydroxyzine.Hx of [place] admissions. -Continue Venlafaxine XR 150 mg daily with breakfast -Continue Venlafaxine 37.5 mg daily with breakfast  FEN/GI: Low sodium diet with 1518mL fluid restriction PPx: Eliquis  Disposition: Progressive with Cardiac Monitoring  Prior to Admission Living Arrangement: Home Anticipated Discharge Location: Home Barriers to Discharge: Safe Discharge Anticipated discharge today.   Subjective:  Interviewed patient at bedside.  She reports sleeping a bit yesterday. She is at her baseline oxygen requirements. She reports feeling well and having no complaints. Discussed possibility of discharge today.  Objective: Temp:  [97.9 F (36.6 C)-98.4 F (36.9 C)] 98.1 F (36.7 C) (09/18 2301) Pulse Rate:  [43-91] 70 (09/19 0300) Resp:  [19-20] 19 (09/18 2301) BP: (93-121)/(61-85) 112/77 (09/18 2301) SpO2:  [91 %-100 %] 100 % (09/19 0300) Physical Exam:  Physical Exam Vitals and nursing note reviewed.  Constitutional:      General: She is not in acute distress.    Appearance: She is obese. She is not ill-appearing, toxic-appearing or diaphoretic.  HENT:     Head: Normocephalic and atraumatic.  Cardiovascular:     Rate and Rhythm: Normal rate and regular rhythm.     Pulses: Normal  pulses.          Radial pulses are 2+ on the right  side and 2+ on the left side.       Dorsalis pedis pulses are 2+ on the right side and 2+ on the left side.     Heart sounds: Normal heart sounds, S1 normal and S2 normal. No murmur heard.   Pulmonary:     Effort: Pulmonary effort is normal. No respiratory distress.     Breath sounds: Normal breath sounds. No wheezing.  Neurological:     Mental Status: She is alert. Mental status is at baseline.  Psychiatric:        Mood and Affect: Mood normal.        Behavior: Behavior normal.        Thought Content: Thought content normal.      Laboratory: Recent Labs  Lab 03/12/20 0038  WBC 8.1  HGB 10.6*  HCT 36.4  PLT 410*   Recent Labs  Lab 03/12/20 0038 03/12/20 1714 03/13/20 0406 03/14/20 0932 03/15/20 0207  NA 141   < > 142 143 139  K 3.8   < > 3.2* 4.4 4.2  CL 95*   < > 95* 96* 94*  CO2 36*   < > 34* 37* 36*  BUN 11   < > 10 23 23   CREATININE 0.47   < > 0.52 0.75 0.61  CALCIUM 8.8*   < > 8.9 8.2* 8.1*  PROT 7.0  --   --   --  6.9  BILITOT 0.8  --   --   --  0.4  ALKPHOS 96  --   --   --  82  ALT 17  --   --   --  17  AST 28  --   --   --  21  GLUCOSE 101*   < > 96 99 153*   < > = values in this interval not displayed.    Arterial Blood Gas result:  pO2 81.7; pCO2 83.5; pH 7.301;  HCO3 39.9, %O2 Sat 94.8.  Mag: 2.0 (WNL)   Imaging/Diagnostic Tests:  DG Chest Portable 1 View:Stable bibasilar hypoventilatory changes. Mild central vascular congestion without overt edema.  Briant Cedar, MD 03/15/2020, 6:21 AM PGY-1, [place] Intern pager: [phone], text pages welcome

## 2020-03-15 NOTE — Progress Notes (Signed)
CRITICAL VALUE ALERT  Critical Value:  pCO2   78.3  Date & Time Notied: 9/19  07:55  Provider Notified: Dr. Kai Levins notified at pt's bedside  Orders Received/Actions taken: Dayshift RN notified

## 2020-03-15 NOTE — Progress Notes (Signed)
Cardiology Office Note Date:  03/18/2020  Patient ID:  [name] White, [name] White 07/17/1955, MRN [id number] PCP:  Richarda Osmond, DO  Electrophysiologist: Dr. Lovena Le     Virtual Visit via Telephone Note   This visit type was conducted due to national recommendations for restrictions regarding the COVID-19 Pandemic (e.g. social distancing) in an effort to limit this patient's exposure and mitigate transmission in our community.  Due to her co-morbid illnesses, this patient is at least at moderate risk for complications without adequate follow up.  This format is felt to be most appropriate for this patient at this time.  The patient did not have access to video technology/had technical difficulties with video requiring transitioning to audio format only (telephone).  All issues noted in this document were discussed and addressed.  No physical exam could be performed with this format.  Please refer to the patient's chart for her  consent to telehealth for [facility].    Date:  03/18/2020   Patient Location: Home Provider Location: Office/Clinic  Evaluation Performed:  Follow-Up Visit    Chief Complaint:  over due annual visit  History of Present Illness: [name] White is a 64 y.o. female with history of HTN, hyperthyroid, COPD, OHS, OSA, chronic CHF (HFpEF), Afib, h/o PE, depression.  She comes in today to be seen for Dr. Lovena Le, last seen by EP service by A. Lynnell Jude, NP Jan 2020, described her rhythm hx as atypical AFlutter, doing well on Flecainide.  Admitted to [facility] 03/12/2020 w/acute on chronic respiratory failure/hypoxia, discharged 03/16/20 Felt to be multifactorial w/OHS, OSA, and diastolic HF Only required one dose of IV lasix, did require short term BIPAP, though had steady improvement to her home O2.  She was reported to be rate controlled Afib Continued home meds without change by d/c summary note.  TODAY The patient identifies herself by name and birth date She is  home now only a couple days, still feels generally tired and weak, but certainly less SOB then she was going into the hospital visit. She had lost track of her pulmonary follow up with COVID restrictions and reduction in general mobility but has follow up planned since her multiple hospitalizations She has had a number of hospital stays this year, Feb with pneumonia, March with COPD exacerbation, May was hospitalized twice with resp failure (multifactorial),  Aug with pneumonia and again as above in Sept ember with respiratory failure.  She feels like this year has been hard on her, recurrent steroid regimes have caused her ongoing weight gain and recurrent hospital stay have weakened her with further reduction in her overall mobility. She denies CP, is aware of her HR faster with activity. She is getting PT at home with hopes of better overall conditioning She denies any dizzy spells, no near syncope or syncope.  She denies any bleeding or signs of bleeding  She reports 100% compliance with her Trilogy  She tells me she has pulmonary follow up in place/planned  Past Medical History:  Diagnosis Date   Abdominal wall hematoma 03/06/2019   Acute bronchitis 08/29/2018   Acute on chronic respiratory failure with hypoxia (HCC) 08/29/2018   Anxiety    Asthma    Chronic lower back pain    COPD (chronic obstructive pulmonary disease) ([place])    Depression    Exposure to COVID-19 virus 11/02/2018   Fall 02/2019   Family history of anesthesia complication    "daughter; causes her to pass out afterwards"   GERD (  gastroesophageal reflux disease)    History of atrial flutter 06/26/2015   History of pulmonary embolus (PE) 11/03/2014   Hypertension    Hyperthyroidism    Left medial tibial stress syndrome 12/26/2017   Lower GI bleed    Migraine    "monthly" (12/28/2013)   Non-traumatic rhabdomyolysis 11/03/2014   Obesity hypoventilation syndrome ([place]) 03/06/2019   Osteoarthritis    "both  knees; back of my neck; right pelvic bone" (12/28/2013)   Paroxysmal A-fib ([place])    Pulmonary embolism ([place]) 12/28/2013   "2 clots in each lung"    Past Surgical History:  Procedure Laterality Date   ABDOMINAL HYSTERECTOMY     APPENDECTOMY     BREAST CYST EXCISION Right    DILATION AND CURETTAGE OF UTERUS     ELECTROPHYSIOLOGIC STUDY N/A 05/27/2015   Procedure: A-Flutter;  Surgeon: Evans Lance, MD;  Location: [facility];  Service: Cardiovascular;  Laterality: N/A;   EXCISIONAL HEMORRHOIDECTOMY     NASAL SINUS SURGERY  2007   THYROIDECTOMY, PARTIAL Right 2005   TUBAL LIGATION     WISDOM TOOTH EXTRACTION      Current Outpatient Medications  Medication Sig Dispense Refill   acetaminophen (TYLENOL) 325 MG tablet Take 2 tablets (650 mg total) by mouth every 6 (six) hours as needed for mild pain.     apixaban (ELIQUIS) 5 MG TABS tablet Take 1 tablet (5 mg total) by mouth 2 (two) times daily. 180 tablet 1   atorvastatin (LIPITOR) 40 MG tablet Take 1 tablet (40 mg total) by mouth daily. (Patient taking differently: Take 40 mg by mouth every evening. ) 90 tablet 3   azelastine (ASTELIN) 0.1 % nasal spray Place 2 sprays into both nostrils 2 (two) times daily. Use in each nostril as directed (Patient taking differently: Place 2 sprays into both nostrils daily. ) 30 mL 0   azelastine (OPTIVAR) 0.05 % ophthalmic solution INSTILL 1 DROP IN BOTH EYES TWICE DAILY AS NEEDED FOR ITCHY EYES (Patient taking differently: Place 1 drop into both eyes 2 (two) times daily. ) 6 mL 1   cetirizine (ZYRTEC) 10 MG tablet TAKE 1 TABLET(10 MG) BY MOUTH DAILY (Patient taking differently: Take 10 mg by mouth daily. ) 30 tablet 1   cyclobenzaprine (FLEXERIL) 5 MG tablet Take 1 tablet (5 mg total) by mouth 2 (two) times daily as needed for muscle spasms. 30 tablet 0   diclofenac sodium (VOLTAREN) 1 % GEL APPLY 2 GRAMS EXTERNALLY TO THE AFFECTED AREA FOUR TIMES DAILY (Patient taking differently:  Apply 2 g topically 4 (four) times daily as needed (arthritis pain - knees, shoulder and thighs). ) 100 g 0   diltiazem (CARDIZEM CD) 120 MG 24 hr capsule Take 1 capsule (120 mg total) by mouth daily. Please keep upcoming appt for future refills 30 capsule 0   flecainide (TAMBOCOR) 100 MG tablet Take 1 tablet (100 mg total) by mouth 2 (two) times daily. Please keep upcoming appt for future refills 60 tablet 0   fluticasone (FLONASE) 50 MCG/ACT nasal spray Place 2 sprays into both nostrils daily as needed for allergies or rhinitis.      furosemide (LASIX) 40 MG tablet TAKE 1 AND 1/2 TABLETS(60 MG) BY MOUTH DAILY (Patient taking differently: Take 60 mg by mouth daily. ) 135 tablet 0   gabapentin (NEURONTIN) 100 MG capsule Take 2 capsules (200 mg total) by mouth 3 (three) times daily. 180 capsule 1   guaiFENesin (MUCINEX) 600 MG 12 hr tablet  Take 1 tablet (600 mg total) by mouth 2 (two) times daily as needed for cough. 30 tablet 0   hydrOXYzine (VISTARIL) 25 MG capsule TAKE 1 CAPSULE(25 MG) BY MOUTH DAILY AS NEEDED FOR ANXIETY (Patient taking differently: Take 25 mg by mouth daily as needed for anxiety. ) 30 capsule 0   ipratropium-albuterol (DUONEB) 0.5-2.5 (3) MG/3ML SOLN Take 3 mLs by nebulization every 6 (six) hours as needed (shortness of breath, wheezing). 360 mL 0   Melatonin 3 MG TABS Take 1 tablet (3 mg total) by mouth at bedtime as needed (sleep). (Patient taking differently: Take 3 mg by mouth at bedtime. ) 30 tablet 0   metoprolol tartrate (LOPRESSOR) 25 MG tablet TAKE 1/2 TABLET(12.5 MG) BY MOUTH TWICE DAILY (Patient taking differently: Take 12.5 mg by mouth 2 (two) times daily. ) 90 tablet 0   Multiple Vitamin (MULTIVITAMIN) tablet Take 1 tablet by mouth daily.     omeprazole (PRILOSEC) 20 MG capsule Take 1 capsule (20 mg total) by mouth daily. 90 capsule 0   OXYGEN Inhale 6-7 L/min into the lungs continuous.      potassium chloride (KLOR-CON) 10 MEQ tablet TAKE 2 TABLETS(20  MEQ) BY MOUTH DAILY (Patient taking differently: Take 20 mEq by mouth daily. ) 180 tablet 0   PRESCRIPTION MEDICATION See admin instructions. Trilogy [employer]-  At bedtime and during any time of rest     venlafaxine (EFFEXOR) 37.5 MG tablet Take 1 tablet (37.5 mg total) by mouth daily with breakfast. Take with a 150 mg ER capsule every morning (Patient taking differently: Take 37.5 mg by mouth daily with breakfast. Take with a 150mg  ER capsule every morning for a total of 187.5mg ) 90 tablet 0   venlafaxine XR (EFFEXOR-XR) 150 MG 24 hr capsule Take 1 capsule (150 mg total) by mouth daily with breakfast. Take with a 37.5 mg tablet every morning (Patient taking differently: Take 150 mg by mouth daily with breakfast. Take with a 37.5mg  tablet every morning for a total of 187.5mg ) 90 capsule 3   VENTOLIN HFA 108 (90 Base) MCG/ACT inhaler INHALE 2 PUFFS INTO THE LUNGS EVERY 6 HOURS AS NEEDED FOR WHEEZING OR SHORTNESS OF BREATH (Patient taking differently: Inhale 2 puffs into the lungs every 4 (four) hours as needed for wheezing or shortness of breath. ) 18 g 0   No current facility-administered medications for this visit.    Allergies:   Caffeine, Hydralazine hcl, Hydrocodone, Ciprofloxacin, Erythromycin, Lisinopril, Oxycodone, Sulfamethoxazole-trimethoprim, and Tape   Social History:  The patient  reports that she quit smoking about 20 years ago. Her smoking use included cigarettes. She has a 5.00 pack-year smoking history. She has never used smokeless tobacco. She reports previous alcohol use. She reports that she does not use drugs.   Family History:  The patient's family history includes Asthma in her mother; Breast cancer in her daughter; Heart failure in her mother; Osteoarthritis in her mother.  ROS:  Please see the history of present illness.    All other systems are reviewed and otherwise negative.   PHYSICAL EXAM:  VS:  BP 122/78    Pulse 97    Ht 5\' 5"  (1.651 m)    Wt  (!) 350 lb (158.8 kg)    BMI 58.24 kg/m  BMI: Body mass index is 58.24 kg/m. She does not sound SOB, is able to speak in full sentences at a normal pace.  She is quite plesent   EKG:   03/12/2020 01;12  Difficult, some beats look sinus, perhaps with frequent PACs Over all in review of her last several EKGs they are of poor quality with significant artifact Suspect some may be sinus, others look like her Aflutter   11/25/2019: TTE IMPRESSIONS  1. Left ventricular ejection fraction, by estimation, is 60 to 65%. The  left ventricle has normal function. Left ventricular endocardial border  not optimally defined to evaluate regional wall motion. There is severe  left ventricular hypertrophy. Left  ventricular diastolic parameters are indeterminate.  2. Right ventricular systolic function is normal. The right ventricular  size is moderately enlarged.  3. Left atrial size was mildly dilated.  4. The mitral valve is normal in structure. Trivial mitral valve  regurgitation. No evidence of mitral stenosis.  5. The aortic valve is grossly normal. Aortic valve regurgitation is not  visualized. No aortic stenosis is present.     Recent Labs: 11/24/2019: TSH 1.094 03/12/2020: B Natriuretic Peptide 74.2; Hemoglobin 10.6; Platelets 410 03/15/2020: ALT 17; BUN 23; Creatinine, Ser 0.61; Magnesium 2.0; Potassium 4.2; Sodium 139  07/21/2019: Cholesterol 197; HDL 82; LDL Cholesterol 106; Total CHOL/HDL Ratio 2.4; Triglycerides 44; VLDL 9   Estimated Creatinine Clearance: 108.1 mL/min (by C-G formula based on SCr of 0.61 mg/dL).   Wt Readings from Last 3 Encounters:  03/18/20 (!) 350 lb (158.8 kg)  03/13/20 (!) 350 lb (158.8 kg)  02/08/20 (!) 346 lb 3 oz (157 kg)     Other studies reviewed: Additional studies/records reviewed today include: summarized above  ASSESSMENT AND PLAN:  1. AFib, atypical AFlutter     H/o PE as well     CHA2DS2Vasc is 6, on Eliquis, appropriately dosed      flecainide and diltiazem     Narrow QRS on her EKGs, AV conduction is harder to establish with poor quality EKGs, no obvious significant AVblock  2. HTN     Looks OK  3. Chronic CHF       HFpEF      Severe LVH on her most recent echo      On lasix      She discussed that she was placed on 1867ml fluid restriction and watches her sodium carefully        4. Numerous recent hospitalizations with acute/chronic hypoxic failure (looks multifactorial), pneumonias     I have urged her to make sure she gets back on track with her pulmonary team, she assures me she is     She will discuss with pulm/PMD her concerns of recurrent use of steroids and weight gain       We will need to see her to revisit her EKG/rhythm not unreasonable to have AFlutter with her recurrent resp failure We have arranged Afib clinic follow up in a couple weeks She may benefit from HF team evaluation/consultation, will refer her to them       Disposition: F/u as above  Current medicines are reviewed at length with the patient today.  The patient did not have any concerns regarding medicines.   Time:   Today, I have spent 18 minutes with the patient with telehealth technology discussing the above problems.      Venetia Night, PA-C 03/18/2020 8:00 AM     [place] [address] [phone] (office)  [phone] (fax)

## 2020-03-16 ENCOUNTER — Ambulatory Visit: Payer: Self-pay

## 2020-03-16 ENCOUNTER — Other Ambulatory Visit: Payer: Self-pay

## 2020-03-16 LAB — BLOOD GAS, ARTERIAL
Acid-Base Excess: 15.3 mmol/L — ABNORMAL HIGH (ref 0.0–2.0)
Bicarbonate: 41.3 mmol/L — ABNORMAL HIGH (ref 20.0–28.0)
Drawn by: 347621
FIO2: 40
O2 Saturation: 98.3 %
Patient temperature: 37
pCO2 arterial: 73.1 mmHg (ref 32.0–48.0)
pH, Arterial: 7.371 (ref 7.350–7.450)
pO2, Arterial: 128 mmHg — ABNORMAL HIGH (ref 83.0–108.0)

## 2020-03-16 MED ORDER — OMEPRAZOLE 20 MG PO CPDR: 20.0000 mg | DELAYED_RELEASE_CAPSULE | Freq: Every day | ORAL | 0 refills | Status: AC

## 2020-03-16 NOTE — Chronic Care Management (AMB) (Signed)
   RN Case Manager Care Management Admission Note 03/16/2020   RNCM notified that [name] White  MRN: [id number] DOB: 1955-10-11 was admitted to the hospital for Heart Failure exacerbation by [facility] on 03/12/20.  Plan:  RNCM will monitor the patients progress  Lazaro Arms RN, BSN, [facility] Care Management Coordinator [place] Phone: [phone] Fax: [phone]

## 2020-03-16 NOTE — Progress Notes (Signed)
Family Medicine Teaching Service Daily Progress Note Intern Pager: [phone]  Patient name: [name] White Medical record number: [id number] Date of birth: 11-19-1955 Age: 64 y.o. Gender: female  Primary Care Provider: Richarda Osmond, DO Consultants: None Code Status: DNR  Pt Overview and Major Events to Date:  Admitted on 9/16  Assessment and Plan:  LAKYNN HALVORSEN a 64 y.o.femalewho presented with SOBand was admitted for CHF exacerbation.PMH significant for paroxysmal Afib, chronic respiratory failure on 6L [place], OSA, HFpEF,Predoabetic, MDD, HTN, HLD, PE, and GERD    Acute on chronic respiratory failure with hypoxia Reportsworsening SoB x2 days with dry cough and dyspnea at rest, andworsening BLEE; on 6L O2baseline at home. Arterial Blood Gas result today:  pO2 128; pCO2 73.1; pH 7.371;  HCO3 41.3, %O2 Sat 98.3. -Continuous cardiac monitoring -Wean off Bi-pap as able   Atrial fibrillation Patient has paroxysmal afib at baseline. EKG noted forafib and borderline QT prolongation. Home medications include diltiazem, flecainide, metoprolol tartrate, and apixaban.Mag yesterday was 2.0. -ContinueApixaban5 mg BID -Continue Diltiazem120 mg daily -Continue Flecainide 100 mg BID -Continue Lopressor 12.5 mg BID -Continuous telemetry   HFpEF History of HF with most recent echo EF(5/31) 60-65%, severe LVH, moderate RVH, LA mildly dilated, no mitral or aortic stenosis.Edema has resolved.  -Continue Lasix 60 mg oral Daily -Low sodium diet with 1553mL fluid restriction -Monitor I's/O's   HTN Admission BP127/97, most recent BP 127/94. Home medications include diltiazem and metoprolol. -Continue Diltiazem120 mg daily -Continue Lopressor 12.5 mg BID -Continue to monitor    HLD Last lipid panel: chol 197, TG 44, HDL 82, LDL 106. Home meds include atorvastatin 40mg  daily. -ContinueAtorvastatin40 mg daily   GERD Patient history of GERD with  home meds famotidine and protonix -Continue Pepcid 20 mg daily -Continue Protonix 40 mg daily   Prediatbetes Patient A1C6.4 pm 8/13. CBG on admission was101. Patient diet controlled at home. - Continue to monitor   Hx of PE Patient has a hx of PE with complaints of chronic leg swelling and pain L>R.  -Continue Eliquis5 mg BID   RestlessLegSyndrome Hx of restless leg syndrome. Home meds include gabapentin, voltaren gel, and tylenol. -ContinueGabapentin 200 mg TID   MDD History of MDD. Home meds include venlafaxine XR and hydroxyzine.Hx of [place] admissions. -Continue Venlafaxine XR 150 mg daily with breakfast -Continue Venlafaxine 37.5 mg daily with breakfast  FEN/GI: Low sodium diet with 1522mL fluid restriction PPx: Eliquis  Disposition: Progressive with Cardiac Monitoring  Prior to Admission Living Arrangement: Home Anticipated Discharge Location: Home Barriers to Discharge: Safe Discharge Anticipated discharge today.   Subjective:  Interviewed patient at bedside.  She reports sleeping well last night and feeling good this morning. Discussed that her lab values are returning to baseline and she can discharge home. She states her niece works until [employer] so will reach out to her after that.  Objective: Temp:  [98.6 F (37 C)-98.7 F (37.1 C)] 98.6 F (37 C) (09/19 2345) Pulse Rate:  [91-102] 91 (09/20 0403) Resp:  [18-24] 18 (09/20 0403) BP: (104-128)/(53-78) 122/78 (09/19 2345) SpO2:  [95 %-100 %] 100 % (09/20 0403) FiO2 (%):  [40 %] 40 % (09/20 0403) Physical Exam:  Physical Exam Vitals and nursing note reviewed.  Constitutional:      General: She is not in acute distress.    Appearance: Normal appearance. She is obese. She is not ill-appearing, toxic-appearing or diaphoretic.  HENT:     Head: Normocephalic and atraumatic.  Cardiovascular:  Rate and Rhythm: Normal rate and regular rhythm.     Pulses: Normal pulses.          Radial pulses  are 2+ on the right side and 2+ on the left side.       Dorsalis pedis pulses are 2+ on the right side and 2+ on the left side.     Heart sounds: Normal heart sounds, S1 normal and S2 normal.  Pulmonary:     Effort: Pulmonary effort is normal. No respiratory distress.     Breath sounds: Normal breath sounds. No wheezing.  Abdominal:     General: There is distension (due to body habitus).     Tenderness: There is no abdominal tenderness.  Musculoskeletal:     Right lower leg: No edema.     Left lower leg: No edema.  Neurological:     Mental Status: She is alert. Mental status is at baseline.  Psychiatric:        Mood and Affect: Mood normal.        Behavior: Behavior normal.        Thought Content: Thought content normal.      Laboratory: Recent Labs  Lab 03/12/20 0038  WBC 8.1  HGB 10.6*  HCT 36.4  PLT 410*   Recent Labs  Lab 03/12/20 0038 03/12/20 1714 03/13/20 0406 03/14/20 0932 03/15/20 0207  NA 141   < > 142 143 139  K 3.8   < > 3.2* 4.4 4.2  CL 95*   < > 95* 96* 94*  CO2 36*   < > 34* 37* 36*  BUN 11   < > 10 23 23   CREATININE 0.47   < > 0.52 0.75 0.61  CALCIUM 8.8*   < > 8.9 8.2* 8.1*  PROT 7.0  --   --   --  6.9  BILITOT 0.8  --   --   --  0.4  ALKPHOS 96  --   --   --  82  ALT 17  --   --   --  17  AST 28  --   --   --  21  GLUCOSE 101*   < > 96 99 153*   < > = values in this interval not displayed.    ABG    Component Value Date/Time   PHART 7.371 03/16/2020 0455   PCO2ART 73.1 (HH) 03/16/2020 0455   PO2ART 128 (H) 03/16/2020 0455   HCO3 41.3 (H) 03/16/2020 0455   TCO2 46 (H) 02/06/2020 1454   O2SAT 98.3 03/16/2020 0455    Imaging/Diagnostic Tests:   DG Chest Portable 1 View:Stable bibasilar hypoventilatory changes. Mild central vascular congestion without overt edema.   Briant Cedar, MD 03/16/2020, 6:12 AM PGY-1, [place] Intern pager: [phone], text pages welcome

## 2020-03-16 NOTE — Discharge Instructions (Signed)
You were hospitalized for worsening shortness of breath. You slowly improved with lasix and BiPAP and were able to wean down to home oxygen. Please continue your home medications as prescribed. Please follow up with your pulmonologist at earliest convenience. We also recommend follow up with your PCP this week to ensure continued improvement.  Thank you for allowing [place] to take care of you. [place]

## 2020-03-16 NOTE — Consult Note (Signed)
   [facility] Inpatient Consult   03/16/2020  BENELLI WINTHER [birth year] [id number]  [facility] [ACO] Patient: [name] White   Patient was screened for [facility] Management services. Patient will have the transition of care call conducted by the primary care provider. This patient is also in an Embedded practice which has a chronic disease management Embedded Care Management team.  Plan: Notification sent to the [facility] and made aware of above needs.   Please contact for further questions,  Natividad Brood, RN BSN [facility] Liaison  [phone] business mobile phone Toll free office [phone]  Fax number: [phone] [email or web address]

## 2020-03-16 NOTE — Progress Notes (Signed)
Physical Therapy Treatment Patient Details Name: [name] White MRN: [id number] DOB: [birth year] Today's Date: 03/16/2020    History of Present Illness 64 y.o. female PMH is significant for Paroxysmal A fib, respiratory failure chronically on 6 L [place], OSA. HFpEF , T2DM, MDD, HTN, HLD,  PE, and GERD. Pt came to ED 9/15 with SOB and worsening B LE edema,  placed on BiPAP and admitted for observaton of COPD exacerbation.      PT Comments    Patient reports feeling better today and eager to d/c home. Tolerated short distance ambulation with min A for balance/safety with use of RW for support. Self limiting distance due to SOB; 2-3/4 DOE with activity. Sp02 stayed >89% on 6L/min 02 [place]. Worked on LE strengthening and cardiovascular endurance performing sit to stands from chair with pt needing rest breaks between repetitions. Will continue to follow.    Follow Up Recommendations  Home health PT;Supervision for mobility/OOB     Equipment Recommendations  None recommended by PT    Recommendations for Other Services       Precautions / Restrictions Precautions Precautions: Fall Restrictions Weight Bearing Restrictions: No    Mobility  Bed Mobility Overal bed mobility: Needs Assistance Bed Mobility: Supine to Sit     Supine to sit: Min guard;HOB elevated     General bed mobility comments: Able to get to EOB with use of rail and increased time.  Transfers Overall transfer level: Needs assistance Equipment used: Rolling walker (2 wheeled) (bari RW) Transfers: Sit to/from Stand Sit to Stand: Min guard         General transfer comment: Min guard for safety. Stood from [employer], transferred to chair post ambulation.  Ambulation/Gait Ambulation/Gait assistance: Min assist Gait Distance (Feet): 16 Feet Assistive device: Rolling walker (2 wheeled) Gait Pattern/deviations: Step-through pattern;Decreased step length - right;Decreased step length - left;Wide base of  support;Shuffle Gait velocity: slowed   General Gait Details: min A for steadying with slow, waddling gait pattern; mildly unsteady. 2/4 DOE. Self limiting distance. Sp02 stayed >89% on 6L/min 02 [place].   Stairs             Wheelchair Mobility    Modified Rankin (Stroke Patients Only)       Balance Overall balance assessment: Mild deficits observed, not formally tested                                          Cognition Arousal/Alertness: Awake/alert Behavior During Therapy: WFL for tasks assessed/performed Overall Cognitive Status: Within Functional Limits for tasks assessed                                        Exercises Other Exercises Other Exercises: sit to stand from chair x3 with long rest break between reps due to SOB.    General Comments General comments (skin integrity, edema, etc.): Sp02 >89% on 6L/min 02 [place].      Pertinent Vitals/Pain Pain Assessment: No/denies pain    Home Living                      Prior Function            PT Goals (current goals can now be found in the care plan section) Progress towards PT goals:  Progressing toward goals    Frequency    Min 3X/week      PT Plan Current plan remains appropriate    Co-evaluation              AM-PAC PT "6 Clicks" Mobility   Outcome Measure  Help needed turning from your back to your side while in a flat bed without using bedrails?: None Help needed moving from lying on your back to sitting on the side of a flat bed without using bedrails?: A Little Help needed moving to and from a bed to a chair (including a wheelchair)?: A Little Help needed standing up from a chair using your arms (e.g., wheelchair or bedside chair)?: A Little Help needed to walk in hospital room?: A Little Help needed climbing 3-5 steps with a railing? : A Lot 6 Click Score: 18    End of Session Equipment Utilized During Treatment: Oxygen Activity Tolerance:  Treatment limited secondary to medical complications (Comment);Patient limited by fatigue (SOB) Patient left: in chair;with call bell/phone within reach Nurse Communication: Mobility status PT Visit Diagnosis: Unsteadiness on feet (R26.81);Other abnormalities of gait and mobility (R26.89);Muscle weakness (generalized) (M62.81);Difficulty in walking, not elsewhere classified (R26.2)     Time: 1886-7737 PT Time Calculation (min) (ACUTE ONLY): 22 min  Charges:  $Therapeutic Activity: 8-22 mins                     Marisa Severin, PT, DPT Acute Rehabilitation Services Pager [phone] Office [phone]       [name] White 03/16/2020, 1:06 PM

## 2020-03-16 NOTE — Progress Notes (Signed)
CRITICAL VALUE ALERT  Critical Value:  pCO2  73.1  Date & Time Notified: 09/20  5:15  Provider Notified: Dr. Rock Nephew  Orders Received/Actions taken: will continue to monitor

## 2020-03-16 NOTE — TOC Initial Note (Signed)
Transition of Care [facility]) - Initial/Assessment Note    Patient Details  Name: [name] White MRN: [id number] Date of Birth: [birth year]  Transition of [facility]) CM/SW Contact:    Ninfa Meeker, RN Phone Number: [phone] 03/16/2020, 10:11 AM  Clinical Narrative:  Patient was previously referred to [facility] for PT/Aide services. Case manager spoke with patient and she states she has contacted them to arrange appointment for Wednesday, 03/17/20 between 1-3p. Case manager contacted Dena, office manager with [facility] to confirm that they are accepting patient. Patient has all necessary DME, lives with her niece in apartment. Is oxygen dependent and oxygen provided by [employer]. Case manager received notification from PA that patient will be discharged around 5p today, PTAR will be arranged for 5:30p transport. Medical Necessity form completed. Bedside RN will be notified of transport arrangements. Patient's niece has been notified by PA.    Expected Discharge Plan: [place] Barriers to Discharge: No Barriers Identified   Patient Goals and CMS Choice Patient states their goals for this hospitalization and ongoing recovery are:: to get home CMS Medicare.gov Compare Post Acute Care list provided to:: Patient Choice offered to / list presented to : Patient  Expected Discharge Plan and Services Expected Discharge Plan: [place] Choice: [place] arrangements for the past 2 months: Apartment                           HH Arranged: PT, Nurse's Aide HH Agency: Other - See comment ([place]) Date [place]: 03/16/20 Time [place]: 53 Representative spoke with at [place]: [place] Arrangements/Services Living arrangements for the past 2 months: [place] with:: Relatives Patient language and need for interpreter reviewed:: Yes Do you feel safe going back to  the place where you live?: Yes      Need for Family Participation in Patient Care: Yes (Comment) Care giver support system in place?: Yes (comment)   Criminal Activity/Legal Involvement Pertinent to Current Situation/Hospitalization: No - Comment as needed  Activities of Daily Living      Permission Sought/Granted Permission sought to share information with : Family Supports    Share Information with NAME: Jonelle Sidle     Permission granted to share info w Relationship: neice     Emotional Assessment       Orientation: : Oriented to Self, Oriented to Place, Oriented to  Time, Oriented to Situation Alcohol / Substance Use: Not Applicable Psych Involvement: No (comment)  Admission diagnosis:  COPD exacerbation ([place]) [J44.1] Acute exacerbation of CHF (congestive heart failure) ([place]) [I50.9] Acute on chronic congestive heart failure, unspecified heart failure type [facility]) [I50.9] Patient Active Problem List   Diagnosis Date Noted  . Acute exacerbation of CHF (congestive heart failure) ([place]) 03/12/2020  . Community acquired pneumonia   . Acute respiratory failure with hypoxia ([place]) 02/06/2020  . SOB (shortness of breath) 11/24/2019  . Respiratory distress   . Respiratory failure, acute-on-chronic ([place]) 11/09/2019  . Seasonal allergies 10/03/2019  . Debility 08/02/2019  . Hypertension associated with diabetes ([place])   . Weight gain due to medication 06/05/2019  . Acute on chronic heart failure with preserved ejection fraction ([place]) 03/14/2019  . Acquired thrombophilia ([place]) due to A-Fib 03/07/2019  . Heart failure with preserved ejection fraction ([place]) 03/06/2019  . Obesity hypoventilation syndrome ([place]) 03/06/2019  . Lower GI bleed   .  COPD exacerbation ([place]) 02/26/2019  . Urge incontinence 01/27/2019  . Shortness of breath 08/29/2018  . OSA (obstructive sleep apnea) 07/05/2018  . Respiratory failure ([place]) 07/05/2018  . Severe episode of recurrent major depressive disorder,  without psychotic features ([place])   . MDD (major depressive disorder), recurrent episode, severe ([place]) 09/07/2015  . Atrial flutter ([place]) 07/29/2015  . Asthma 11/12/2014  . History of hypertension 11/05/2014  . Paroxysmal atrial fibrillation ([place]) 11/03/2014  . Chronic anticoagulation 11/03/2014  . Major depressive disorder 11/03/2014  . History of pulmonary embolus (PE) 11/03/2014  . MDD (major depressive disorder), recurrent severe, without psychosis ([place]) 03/05/2014  . Hot flashes 04/22/2011  . OVERACTIVE BLADDER 04/18/2008  . Osteoarthritis of both knees 04/18/2008  . Osteoarthritis involving multiple joints on both sides of body 09/14/2007  . Morbid obesity ([place]) 08/24/2006  . RESTLESS LEGS SYNDROME 08/24/2006  . HYPERTENSION, BENIGN SYSTEMIC 08/24/2006  . RHINITIS, ALLERGIC 08/24/2006  . GASTROESOPHAGEAL REFLUX, NO ESOPHAGITIS 08/24/2006   PCP:  Richarda Osmond, DO Pharmacy:   [facility] [place], [address] [place] [address] Phone: [phone] Fax: [phone]     Social Determinants of Health (SDOH) Interventions    Readmission Risk Interventions Readmission Risk Prevention Plan 02/07/2020 07/22/2019 03/08/2019  Transportation Screening Complete Complete Complete  Medication Review Press photographer) Complete Complete Complete  PCP or Specialist appointment within 3-5 days of discharge Complete Complete Complete  HRI or Home Care Consult Complete Complete Complete  SW Recovery Care/Counseling Consult Complete Complete Complete  Palliative Care Screening Complete Not Applicable Not Applicable  Skilled Nursing Facility Complete Not Applicable Not Applicable  Some recent data might be hidden

## 2020-03-16 NOTE — Progress Notes (Signed)
RN called RT that pt stated she is SOB and needing her PRN treatment. RT at bedside now giving treatment.

## 2020-03-17 ENCOUNTER — Telehealth: Payer: Self-pay | Admitting: *Deleted

## 2020-03-17 NOTE — Telephone Encounter (Signed)
Lvm to contact patient to go over chart and medicines before virtual visit in the am .

## 2020-03-18 ENCOUNTER — Telehealth (INDEPENDENT_AMBULATORY_CARE_PROVIDER_SITE_OTHER): Payer: Medicare HMO | Admitting: Physician Assistant

## 2020-03-18 ENCOUNTER — Other Ambulatory Visit: Payer: Self-pay | Admitting: *Deleted

## 2020-03-18 ENCOUNTER — Other Ambulatory Visit: Payer: Self-pay

## 2020-03-18 VITALS — BP 122/78 | HR 97 | Ht 65.0 in | Wt 350.0 lb

## 2020-03-18 DIAGNOSIS — I5030 Unspecified diastolic (congestive) heart failure: Secondary | ICD-10-CM

## 2020-03-18 DIAGNOSIS — I484 Atypical atrial flutter: Secondary | ICD-10-CM | POA: Diagnosis not present

## 2020-03-18 DIAGNOSIS — I10 Essential (primary) hypertension: Secondary | ICD-10-CM

## 2020-03-18 DIAGNOSIS — I4819 Other persistent atrial fibrillation: Secondary | ICD-10-CM | POA: Diagnosis not present

## 2020-03-18 DIAGNOSIS — I5033 Acute on chronic diastolic (congestive) heart failure: Secondary | ICD-10-CM

## 2020-03-18 NOTE — Patient Instructions (Signed)
Medication Instructions:   Your physician recommends that you continue on your current medications as directed. Please refer to the Current Medication list given to you today.  *If you need a refill on your cardiac medications before your next appointment, please call your pharmacy*   Lab Work: [place]   If you have labs (blood work) drawn today and your tests are completely normal, you will receive your results only by: Marland Kitchen MyChart Message (if you have MyChart) OR . A paper copy in the mail If you have any lab test that is abnormal or we need to change your treatment, we will call you to review the results.   Testing/Procedures: NONE ORDERED  TODAY    Follow-Up: At [facility], you and your health needs are our priority.  As part of our continuing mission to provide you with exceptional heart care, we have created designated Provider Care Teams.  These Care Teams include your primary Cardiologist (physician) and Advanced Practice Providers (APPs -  Physician Assistants and Nurse Practitioners) who all work together to provide you with the care you need, when you need it.  We recommend signing up for the patient portal called "MyChart".  Sign up information is provided on this After Visit Summary.  MyChart is used to connect with patients for Virtual Visits (Telemedicine).  Patients are able to view lab/test results, encounter notes, upcoming appointments, etc.  Non-urgent messages can be sent to your provider as well.   To learn more about what you can do with MyChart, go to [email or web address].    Your next appointment:  With [facility] Clinic in October   Other Instructions

## 2020-03-18 NOTE — Chronic Care Management (AMB) (Signed)
  Care Management   Note  03/18/2020 Name: [name] White MRN: [id number] DOB: 1956-05-09  [name] White is a 64 y.o. year old female who is a primary care patient of Richarda Osmond, DO and is actively engaged with the care management team. I reached out to [place] [employer] by phone today to assist with re-scheduling a follow up visit with the RN Case Manager.  Follow up plan: Unsuccessful telephone outreach attempt made. A HIPAA compliant phone message was left for the patient providing contact information and requesting a return call. The care management team will reach out to the patient again over the next 7 days. If patient returns call to provider office, please advise to call [place] at [phone].  [place] Management

## 2020-03-19 NOTE — Addendum Note (Signed)
Addended by: Jiles Harold on: 03/19/2020 09:51 AM   Modules accepted: Level of Service, SmartSet

## 2020-03-19 NOTE — Addendum Note (Signed)
Addended by: Jiles Harold on: 03/19/2020 09:42 AM   Modules accepted: Miquel Dunn

## 2020-03-19 NOTE — Progress Notes (Signed)
This encounter was created in error - please disregard.

## 2020-03-20 ENCOUNTER — Ambulatory Visit: Payer: Medicare HMO

## 2020-03-21 ENCOUNTER — Other Ambulatory Visit: Payer: Self-pay | Admitting: Internal Medicine

## 2020-03-21 DIAGNOSIS — E1159 Type 2 diabetes mellitus with other circulatory complications: Secondary | ICD-10-CM | POA: Diagnosis not present

## 2020-03-21 DIAGNOSIS — I471 Supraventricular tachycardia: Secondary | ICD-10-CM

## 2020-03-21 DIAGNOSIS — J449 Chronic obstructive pulmonary disease, unspecified: Secondary | ICD-10-CM | POA: Diagnosis not present

## 2020-03-21 DIAGNOSIS — J9601 Acute respiratory failure with hypoxia: Secondary | ICD-10-CM | POA: Diagnosis not present

## 2020-03-22 NOTE — Chronic Care Management (AMB) (Signed)
  Care Management   Follow Up Note   03/22/2020 Name: [name] White MRN: [id number] DOB: 07/06/1955  Referred by: Richarda Osmond, DO Reason for referral : Chronic Care Management (Trilogy Mask)   [name] White is a 64 y.o. year old female who is a primary care patient of Doristine Mango L, DO. The care management team was consulted for assistance with care management and care coordination needs.    Review of patient status, including review of consultants reports, relevant laboratory and other test results, and collaboration with appropriate care team members and the patient's provider was performed as part of comprehensive patient evaluation and provision of chronic care management services.    SDOH (Social Determinants of Health) assessments performed: No See Care Plan activities for detailed interventions related to [facility])     Advanced Directives: See Care Plan and Vynca application for related entries.   Goals Addressed              This Visit's Progress   .  I need my mask for my Trilogy machine (pt-stated)        CARE PLAN ENTRY (see longitudinal plan of care for additional care plan information)  Current Barriers:  . Care Coordination needs related to DME in a patient with COPD, Respiratory failure acute on Chronic-patient called states last week she called [employer] needing a new mask for her trilogy machine. She was told that  it would be mailed to her.  She ended up going in the hospital. When she went home the next week she still did not have the mask. After calling the RT she was unable to get the mask to her.  Patient called [employer] with intent of switching companies.   Nurse Case Manager Clinical Goal(s): Over the next 14 days, patient will verbalize understanding of plan to obtain the mask for Trilogy machine  Interventions:  . Inter-disciplinary care team collaboration (see longitudinal plan of care) . RNCM called [employer] spoke Liaison and explained  situation - They offered to correct the situation if the patient was willing.  (patient was notified) . Discussed plans with patient for ongoing care management follow up and provided patient with direct contact information for care management team . RNCM had spoke with Caryl Pina from [place] patient had given RNCM  number for contact.  Patient Self Care Activities:  . Patient verbalizes understanding of plan to obtain mask for Trilogy machine . Calls pharmacy for medication refills . Calls provider office for new concerns or questions . Unable to independently navigate DME companies  Initial goal documentation         The care management team will reach out to the patient again over the next 14 days.   Lazaro Arms RN, BSN, [facility] Care Management Coordinator [place] Phone: [phone] Fax: [phone]

## 2020-03-22 NOTE — Patient Instructions (Signed)
Visit Information  Goals Addressed              This Visit's Progress     I need my mask for my Trilogy machine (pt-stated)        CARE PLAN ENTRY (see longitudinal plan of care for additional care plan information)  Current Barriers:   Care Coordination needs related to DME in a patient with COPD, Respiratory failure acute on Chronic-patient called states last week she called [employer] needing a new mask for her trilogy machine. She was told that  it would be mailed to her.  She ended up going in the hospital. When she went home the next week she still did not have the mask. After calling the RT she was unable to get the mask to her.  Patient called [employer] with intent of switching companies.   Nurse Case Manager Clinical Goal(s): Over the next 14 days, patient will verbalize understanding of plan to obtain the mask for Trilogy machine  Interventions:   Inter-disciplinary care team collaboration (see longitudinal plan of care)  RNCM called [employer] spoke Liaison and explained situation - They offered to correct the situation if the patient was willing.  (patient was notified)  Discussed plans with patient for ongoing care management follow up and provided patient with direct contact information for care management team  RNCM had spoke with Caryl Pina from [place] patient had given RNCM  number for contact.  Patient Self Care Activities:   Patient verbalizes understanding of plan to obtain mask for [place] for medication refills  Calls provider office for new concerns or questions  Unable to independently navigate DME companies  Initial goal documentation        Ms. Arguijo was given information about Care Management services today including:  1. Care Management services include personalized support from designated clinical staff supervised by her physician, including individualized plan of care and coordination with other care providers 2. 24/7  contact phone numbers for assistance for urgent and routine care needs. 3. The patient may stop CCM services at any time (effective at the end of the month) by phone call to the office staff.  Patient agreed to services and verbal consent obtained.   The patient verbalized understanding of instructions provided today and declined a print copy of patient instruction materials.   The care management team will reach out to the patient again over the next 14 days.   Lazaro Arms RN, BSN, [facility] Care Management Coordinator [place] Phone: [phone] Fax: [phone]

## 2020-03-24 NOTE — Progress Notes (Signed)
Patient called [name] White back and spoke to her on 03/20/2020

## 2020-03-24 NOTE — Chronic Care Management (AMB) (Signed)
  Care Management   Note  03/24/2020 Name: LUSIA GREIS MRN: [id number] DOB: [birth year]  KAYAL MULA is a 64 y.o. year old female who is a primary care patient of Richarda Osmond, DO and is actively engaged with the care management team. I reached out to [place] [employer] by phone today to assist with re-scheduling a follow up visit with the RN Case Manager  Follow up plan: Telephone appointment with care management team member scheduled for:03/20/2020  [place] Management

## 2020-03-25 ENCOUNTER — Other Ambulatory Visit: Payer: Self-pay | Admitting: Student in an Organized Health Care Education/Training Program

## 2020-03-25 ENCOUNTER — Telehealth: Payer: Self-pay | Admitting: Emergency Medicine

## 2020-03-25 NOTE — Telephone Encounter (Signed)
msg sent to Dr Lamonte Sakai prematurely by accident  I called and tried to reach the pt- need to find out her DME- she is not in [facility]  Pt did not answer and her VM box was full Will call back

## 2020-03-25 NOTE — Telephone Encounter (Signed)
We have to send an order to the DME company so they will go out and collect the data from the device.

## 2020-03-26 NOTE — Telephone Encounter (Signed)
ATC pt. VM is full. WCB.  

## 2020-03-27 NOTE — Telephone Encounter (Signed)
Tried calling again, still no answer and no VM- will close per protocol.

## 2020-03-28 ENCOUNTER — Other Ambulatory Visit: Payer: Self-pay | Admitting: Internal Medicine

## 2020-03-28 DIAGNOSIS — I471 Supraventricular tachycardia: Secondary | ICD-10-CM

## 2020-03-29 DIAGNOSIS — J449 Chronic obstructive pulmonary disease, unspecified: Secondary | ICD-10-CM | POA: Diagnosis not present

## 2020-03-30 DIAGNOSIS — I110 Hypertensive heart disease with heart failure: Secondary | ICD-10-CM | POA: Diagnosis not present

## 2020-03-30 DIAGNOSIS — J9622 Acute and chronic respiratory failure with hypercapnia: Secondary | ICD-10-CM | POA: Diagnosis not present

## 2020-03-30 DIAGNOSIS — E119 Type 2 diabetes mellitus without complications: Secondary | ICD-10-CM | POA: Diagnosis not present

## 2020-03-30 DIAGNOSIS — I5033 Acute on chronic diastolic (congestive) heart failure: Secondary | ICD-10-CM | POA: Diagnosis not present

## 2020-03-30 DIAGNOSIS — I480 Paroxysmal atrial fibrillation: Secondary | ICD-10-CM | POA: Diagnosis not present

## 2020-03-30 DIAGNOSIS — G4733 Obstructive sleep apnea (adult) (pediatric): Secondary | ICD-10-CM | POA: Diagnosis not present

## 2020-03-30 DIAGNOSIS — J45909 Unspecified asthma, uncomplicated: Secondary | ICD-10-CM | POA: Diagnosis not present

## 2020-03-30 DIAGNOSIS — J9621 Acute and chronic respiratory failure with hypoxia: Secondary | ICD-10-CM | POA: Diagnosis not present

## 2020-03-30 DIAGNOSIS — J189 Pneumonia, unspecified organism: Secondary | ICD-10-CM | POA: Diagnosis not present

## 2020-03-31 ENCOUNTER — Ambulatory Visit: Payer: Medicare HMO

## 2020-03-31 ENCOUNTER — Telehealth: Payer: Self-pay

## 2020-03-31 ENCOUNTER — Ambulatory Visit: Payer: Medicare HMO | Admitting: Licensed Clinical Social Worker

## 2020-03-31 NOTE — Chronic Care Management (AMB) (Signed)
   Social Work  Care Management Collaboration 03/31/2020 Name: DOMANIQUE HUESMAN MRN: [id number] DOB: 18-Aug-1955 SHANY MARINEZ is a 64 y.o. year old female who sees Richarda Osmond, DO for primary care. LCSW was consulted by CCM RN to assistance patient with  Level of Care Concerns. Patient no longer has Physiological scientist.  Needs assistance with locating new agency to provider personal care services.   Intervention: Patient was not interviewed or contacted during this encounter.  LCSW collaborated with CCM RN.  LCSW mailed PCS list to patient . Plan: LCSW will F/U with patient in 7 to 10 days  Review of patient status, including review of consultants reports, relevant laboratory and other test results, and collaboration with appropriate care team members and the patient's provider was performed as part of comprehensive patient evaluation and provision of chronic care management services.      [name], [place] / [place]   [phone] 2:52 PM

## 2020-03-31 NOTE — Telephone Encounter (Signed)
Charise from [facility] calling for PT verbal orders as follows:  2 time(s) weekly for 3 week(s), then 1 time(s) weekly for 1 week(s)  Also requesting Home health aide verbal orders as follows: 1 time weekly for 1 week, then 2 times weekly for 3 weeks.   Verbal orders given per [facility] protocol  Talbot Grumbling, RN

## 2020-04-01 ENCOUNTER — Ambulatory Visit (HOSPITAL_COMMUNITY): Payer: Medicare HMO | Admitting: Nurse Practitioner

## 2020-04-02 DIAGNOSIS — N3281 Overactive bladder: Secondary | ICD-10-CM | POA: Diagnosis not present

## 2020-04-06 NOTE — Patient Instructions (Signed)
Visit Information  Goals Addressed              This Visit's Progress   .  COMPLETED: I need my mask for my Trilogy machine (pt-stated)        CARE PLAN ENTRY (see longitudinal plan of care for additional care plan information)  Current Barriers:  . Care Coordination needs related to DME in a patient with COPD, Respiratory failure acute on Chronic-patient called states last week she called [employer] needing a new mask for her trilogy machine. She was told that  it would be mailed to her.  She ended up going in the hospital. When she went home the next week she still did not have the mask. After calling the RT she was unable to get the mask to her.  Patient called [employer] with intent of switching companies.   Nurse Case Manager Clinical Goal(s): Over the next 14 days, patient will verbalize understanding of plan to obtain the mask for Trilogy machine  Interventions:  . Inter-disciplinary care team collaboration (see longitudinal plan of care) . RNCM called [employer] spoke Liaison and explained situation - They offered to correct the situation if the patient was willing.  (patient was notified) . Discussed plans with patient for ongoing care management follow up and provided patient with direct contact information for care management team . RNCM called and spoke with Liaison with [employer] and they stated they wanted to make things right with the patient. . [name] Stated that she was brought a mask an extra one new tubing and her machine had maintains done to it. Marland Kitchen She was very pleased with the service.   Patient Self Care Activities:  . Patient verbalizes understanding of plan to obtain mask for Trilogy machine . Calls pharmacy for medication refills . Calls provider office for new concerns or questions . Unable to independently navigate DME companies  Please see past updates related to this goal by clicking on the "Past Updates" button in the selected goal         Ms.  Choate was given information about Care Management services today including:  1. Care Management services include personalized support from designated clinical staff supervised by her physician, including individualized plan of care and coordination with other care providers 2. 24/7 contact phone numbers for assistance for urgent and routine care needs. 3. The patient may stop CCM services at any time (effective at the end of the month) by phone call to the office staff.  Patient agreed to services and verbal consent obtained.   The patient verbalized understanding of instructions provided today and declined a print copy of patient instruction materials.   The care management team will reach out to the patient again over the next 14 days.   Lazaro Arms RN, BSN, [facility] Care Management Coordinator [place] Phone: [phone] Fax: [phone]

## 2020-04-06 NOTE — Chronic Care Management (AMB) (Signed)
  Care Management   Follow Up Note   04/06/2020  Late Entry Name: [name] White MRN: [id number] DOB: [birth year]  Referred by: [name] Osmond, DO Reason for referral : Chronic Care Management (Trilogy Mask F/U)   [name] White is a 64 y.o. year old female who is a primary care patient of [name] Mango L, DO. The care management team was consulted for assistance with care management and care coordination needs.    Review of patient status, including review of consultants reports, relevant laboratory and other test results, and collaboration with appropriate care team members and the patient's provider was performed as part of comprehensive patient evaluation and provision of chronic care management services.    SDOH (Social Determinants of Health) assessments performed: No See Care Plan activities for detailed interventions related to [facility])     Advanced Directives: See Care Plan and Vynca application for related entries.   Goals Addressed              This Visit's Progress   .  COMPLETED: I need my mask for my Trilogy machine (pt-stated)        CARE PLAN ENTRY (see longitudinal plan of care for additional care plan information)  Current Barriers:  . Care Coordination needs related to DME in a patient with COPD, Respiratory failure acute on Chronic-patient called states last week she called [employer] needing a new mask for her trilogy machine. She was told that  it would be mailed to her.  She ended up going in the hospital. When she went home the next week she still did not have the mask. After calling the RT she was unable to get the mask to her.  Patient called [employer] with intent of switching companies.   Nurse Case Manager Clinical Goal(s): Over the next 14 days, patient will verbalize understanding of plan to obtain the mask for Trilogy machine  Interventions:  . Inter-disciplinary care team collaboration (see longitudinal plan of care) . RNCM called [employer]  spoke Liaison and explained situation - They offered to correct the situation if the patient was willing.  (patient was notified) . Discussed plans with patient for ongoing care management follow up and provided patient with direct contact information for care management team . RNCM called and spoke with Liaison with [employer] and they stated they wanted to make things right with the patient. . [name] Stated that she was brought a mask an extra one new tubing and her machine had maintains done to it. Marland Kitchen She was very pleased with the service.   Patient Self Care Activities:  . Patient verbalizes understanding of plan to obtain mask for Trilogy machine . Calls pharmacy for medication refills . Calls provider office for new concerns or questions . Unable to independently navigate DME companies  Please see past updates related to this goal by clicking on the "Past Updates" button in the selected goal          The care management team will reach out to the patient again over the next 14 days.   Lazaro Arms RN, BSN, [facility] Care Management Coordinator [place] Phone: [phone] Fax: [phone]

## 2020-04-08 DIAGNOSIS — J9621 Acute and chronic respiratory failure with hypoxia: Secondary | ICD-10-CM | POA: Diagnosis not present

## 2020-04-08 DIAGNOSIS — I480 Paroxysmal atrial fibrillation: Secondary | ICD-10-CM | POA: Diagnosis not present

## 2020-04-08 DIAGNOSIS — I110 Hypertensive heart disease with heart failure: Secondary | ICD-10-CM | POA: Diagnosis not present

## 2020-04-08 DIAGNOSIS — J189 Pneumonia, unspecified organism: Secondary | ICD-10-CM | POA: Diagnosis not present

## 2020-04-08 DIAGNOSIS — E119 Type 2 diabetes mellitus without complications: Secondary | ICD-10-CM | POA: Diagnosis not present

## 2020-04-08 DIAGNOSIS — G4733 Obstructive sleep apnea (adult) (pediatric): Secondary | ICD-10-CM | POA: Diagnosis not present

## 2020-04-08 DIAGNOSIS — I5033 Acute on chronic diastolic (congestive) heart failure: Secondary | ICD-10-CM | POA: Diagnosis not present

## 2020-04-08 DIAGNOSIS — J45909 Unspecified asthma, uncomplicated: Secondary | ICD-10-CM | POA: Diagnosis not present

## 2020-04-08 DIAGNOSIS — J9622 Acute and chronic respiratory failure with hypercapnia: Secondary | ICD-10-CM | POA: Diagnosis not present

## 2020-04-09 DIAGNOSIS — I480 Paroxysmal atrial fibrillation: Secondary | ICD-10-CM | POA: Diagnosis not present

## 2020-04-09 DIAGNOSIS — I5033 Acute on chronic diastolic (congestive) heart failure: Secondary | ICD-10-CM | POA: Diagnosis not present

## 2020-04-09 DIAGNOSIS — G4733 Obstructive sleep apnea (adult) (pediatric): Secondary | ICD-10-CM | POA: Diagnosis not present

## 2020-04-09 DIAGNOSIS — J45909 Unspecified asthma, uncomplicated: Secondary | ICD-10-CM | POA: Diagnosis not present

## 2020-04-09 DIAGNOSIS — I110 Hypertensive heart disease with heart failure: Secondary | ICD-10-CM | POA: Diagnosis not present

## 2020-04-09 DIAGNOSIS — J9621 Acute and chronic respiratory failure with hypoxia: Secondary | ICD-10-CM | POA: Diagnosis not present

## 2020-04-09 DIAGNOSIS — E119 Type 2 diabetes mellitus without complications: Secondary | ICD-10-CM | POA: Diagnosis not present

## 2020-04-09 DIAGNOSIS — J189 Pneumonia, unspecified organism: Secondary | ICD-10-CM | POA: Diagnosis not present

## 2020-04-09 DIAGNOSIS — J9622 Acute and chronic respiratory failure with hypercapnia: Secondary | ICD-10-CM | POA: Diagnosis not present

## 2020-04-10 ENCOUNTER — Other Ambulatory Visit: Payer: Self-pay | Admitting: Student in an Organized Health Care Education/Training Program

## 2020-04-10 ENCOUNTER — Telehealth: Payer: Self-pay | Admitting: Licensed Clinical Social Worker

## 2020-04-10 NOTE — Chronic Care Management (AMB) (Signed)
    Clinical Social Work  Care Management Outreach   04/10/2020 Name: [name] White MRN: [id number] DOB: 1955/09/27  [name] White is a 64 y.o. year old female who is a primary care patient of Richarda Osmond, DO .   F/U phone call to [name] White today to assess needs and barriers with personal care service.  The outreach was unsuccessful. A HIPPA compliant phone message was left for the patient providing contact information and requesting a return call.   Plan: LCSW will wait for return call, if no return call is received, will call gain in 5 to 7 days  Review of patient status, including review of consultants reports, relevant laboratory and other test results, and collaboration with appropriate care team members and the patient's provider was performed as part of comprehensive patient evaluation and provision of care management services.    [name], [place] / [place]   [phone] 11:18 AM

## 2020-04-15 ENCOUNTER — Other Ambulatory Visit: Payer: Self-pay

## 2020-04-15 ENCOUNTER — Telehealth: Payer: Self-pay | Admitting: Emergency Medicine

## 2020-04-15 ENCOUNTER — Ambulatory Visit (INDEPENDENT_AMBULATORY_CARE_PROVIDER_SITE_OTHER): Payer: Medicare HMO | Admitting: Emergency Medicine

## 2020-04-15 ENCOUNTER — Encounter: Payer: Self-pay | Admitting: Emergency Medicine

## 2020-04-15 DIAGNOSIS — E662 Morbid (severe) obesity with alveolar hypoventilation: Secondary | ICD-10-CM | POA: Diagnosis not present

## 2020-04-15 DIAGNOSIS — J9610 Chronic respiratory failure, unspecified whether with hypoxia or hypercapnia: Secondary | ICD-10-CM | POA: Diagnosis not present

## 2020-04-15 NOTE — Telephone Encounter (Signed)
[facility] and left voice mail to schedule OV in December 2021 per RB. If pt calls back please schedule pt for OV.

## 2020-04-15 NOTE — Progress Notes (Signed)
Virtual Visit via Telephone Note  I connected with [name] White on 04/15/20 at  9:00 AM EDT by telephone and verified that I am speaking with the correct person using two identifiers.  Location: Patient: Home Provider: Office   I discussed the limitations, risks, security and privacy concerns of performing an evaluation and management service by telephone and the availability of in person appointments. I also discussed with the patient that there may be a patient responsible charge related to this service. The patient expressed understanding and agreed to proceed.   History of Present Illness: [name] White is a 56, former smoker with normal spirometry 07/18/2018, upper airway irritation syndrome and vocal cord dysfunction with chronic cough, hypertension atrial fibrillation/flutter with chronic diastolic CHF, remote pulmonary embolism, obesity hypoventilation syndrome and suspected OSA (no PSG in system).  She is on a trilogy device for combined hypoxic and hypercapnic respiratory failure (PCO2 73-78 on recent hospitalization).  She was in the hospital in the family practice service 9/16-9/20/2021 with acute hypoxic and hypercapnic respiratory failure in the setting of atrial fibrillation and HFpEF.  She was diuresed effectively, continued on trilogy device.    Observations/Objective: -On Zyrtec, Astelin nasal spray, fluticasone nasal spray, Mucinex prn, Prilosec -She has DuoNeb and albuterol available, uses about 2x a day, feels that she benefits.  -Has Trilogy since 06/2019, uses reliably at night, sometimes during the day when napping; gets good clinical benefit -O2 at 5-7 L/min -her orthopnea is much improved. She is still having squeaky voice, some intermittent UA. She is having frequent cough since the weather has gotten cooler.   -CT chest 02/06/20 >> bilateral lower lobe patchy infiltrates, likely atelectatic.  No effusions   Assessment and Plan: -needs flu shot -COVID-19 vaccine  up-to-date -Continue her allergy regimen, GERD regimen -Okay to continue DuoNeb, albuterol as needed although not much in the way of obstruction on her PFT.  We may decide to repeat her spirometry to ensure no progression -Continue trilogy device and supplemental oxygen as ordered -CHF and A. fib management as ordered.  Better compensated since her recent hospitalization  Follow Up Instructions: OV in the office before the end of 2021.   I discussed the assessment and treatment plan with the patient. The patient was provided an opportunity to ask questions and all were answered. The patient agreed with the plan and demonstrated an understanding of the instructions.   The patient was advised to call back or seek an in-person evaluation if the symptoms worsen or if the condition fails to improve as anticipated.  I provided 30 minutes of non-face-to-face time during this encounter.   Collene Gobble, MD

## 2020-04-16 ENCOUNTER — Other Ambulatory Visit: Payer: Self-pay | Admitting: Family Medicine

## 2020-04-17 ENCOUNTER — Telehealth: Payer: Self-pay | Admitting: Licensed Clinical Social Worker

## 2020-04-17 DIAGNOSIS — J9622 Acute and chronic respiratory failure with hypercapnia: Secondary | ICD-10-CM | POA: Diagnosis not present

## 2020-04-17 DIAGNOSIS — E119 Type 2 diabetes mellitus without complications: Secondary | ICD-10-CM | POA: Diagnosis not present

## 2020-04-17 DIAGNOSIS — G4733 Obstructive sleep apnea (adult) (pediatric): Secondary | ICD-10-CM | POA: Diagnosis not present

## 2020-04-17 DIAGNOSIS — J189 Pneumonia, unspecified organism: Secondary | ICD-10-CM | POA: Diagnosis not present

## 2020-04-17 DIAGNOSIS — I5033 Acute on chronic diastolic (congestive) heart failure: Secondary | ICD-10-CM | POA: Diagnosis not present

## 2020-04-17 DIAGNOSIS — J9621 Acute and chronic respiratory failure with hypoxia: Secondary | ICD-10-CM | POA: Diagnosis not present

## 2020-04-17 DIAGNOSIS — I110 Hypertensive heart disease with heart failure: Secondary | ICD-10-CM | POA: Diagnosis not present

## 2020-04-17 DIAGNOSIS — I480 Paroxysmal atrial fibrillation: Secondary | ICD-10-CM | POA: Diagnosis not present

## 2020-04-17 DIAGNOSIS — J45909 Unspecified asthma, uncomplicated: Secondary | ICD-10-CM | POA: Diagnosis not present

## 2020-04-17 NOTE — Chronic Care Management (AMB) (Signed)
    Clinical Social Work  Care Management  2nd Unsuccessful Outreach   04/17/2020 Name: [name] White MRN: [id number] DOB: 1956-06-09  [name] White is a 64 y.o. year old female who is a primary care patient of Richarda Osmond, DO .   F/U phone call to [name] White today to assess needs, and progress with personal care services.  The outreach was unsuccessful. A HIPPA compliant phone message was left for the patient providing contact information and requesting a return call.   Intervention: Collaborated with CCM RN for ongoing patient's needs.   Plan: If no return call is received, will call collaborate with CCM RN during her phone appointment with patient next week  Review of patient status, including review of consultants reports, relevant laboratory and other test results, and collaboration with appropriate care team members and the patient's provider was performed as part of comprehensive patient evaluation and provision of care management services.    [name], [place] / [place]   [phone] 11:52 AM

## 2020-04-20 DIAGNOSIS — J9601 Acute respiratory failure with hypoxia: Secondary | ICD-10-CM | POA: Diagnosis not present

## 2020-04-20 DIAGNOSIS — J449 Chronic obstructive pulmonary disease, unspecified: Secondary | ICD-10-CM | POA: Diagnosis not present

## 2020-04-20 DIAGNOSIS — E1159 Type 2 diabetes mellitus with other circulatory complications: Secondary | ICD-10-CM | POA: Diagnosis not present

## 2020-04-21 ENCOUNTER — Telehealth: Payer: Self-pay

## 2020-04-21 ENCOUNTER — Telehealth: Payer: Medicare HMO

## 2020-04-21 NOTE — Telephone Encounter (Signed)
  Care Management   Outreach Note  04/21/2020 Name: [name] White MRN: [id number] DOB: 07-19-1955  Referred by: Richarda Osmond, DO Reason for referral : Appointment (COPD)   An unsuccessful telephone outreach was attempted today. The patient was referred to the case management team for assistance with care management and care coordination.   Follow Up Plan: A HIPAA compliant phone message was left for the patient providing contact information and requesting a return call.  The care management team will reach out to the patient again over the next 7-14 days.   Lazaro Arms RN, BSN, [facility] Care Management Coordinator [place] Phone: [phone] Fax: [phone]

## 2020-04-22 ENCOUNTER — Telehealth: Payer: Self-pay | Admitting: *Deleted

## 2020-04-22 ENCOUNTER — Ambulatory Visit: Payer: Medicare HMO

## 2020-04-22 NOTE — Patient Instructions (Signed)
Visit Information  Goals Addressed              This Visit's Progress   .  My aid had to be tested for covid. (pt-stated)        CARE PLAN ENTRY (see longitudinal plan of care for additional care plan information)  Current Barriers:  . Chronic Disease Management support and education needs related to post discharge instructions,for IP event on  02/06/20 including medication review , MD follow up appointments and Home Health-=patient states that she went in to the hospital due to her aid that she had in the home was suspected of having covid.  She had some symptoms and was sent to the hospital to be tested .  She states that she did not have covid but she has pneumonia.  She states that she was treated and was stable and then sent home but she does not have Home health and will not use the company that she had before.  Nurse Case Manager Clinical Goal(s):  Marland Kitchen Over the next 30 days, patient will verbalize understanding of plan for home health  Interventions:  . Inter-disciplinary care team collaboration (see longitudinal plan of care) . Evaluation of current treatment plan related to IP admission on 02-06-20 and patient's adherence to plan as established by provider. . Reviewed medications with patient and she has all of her meds . Collaborated with PCP regarding referral for hoe health .  Marland Kitchen Patient returned my call.  She stated that she called back when she was able to talk.  when the weather changed it affects her breathing. She was not able to talk long today. She let me know that she picked an agency for her PCS worker and she has contacted [facility] and they will contact them to get the process going. . Patient wants to get a flu shot and RNCM will need to see who will be able to go to the home to accommodate her. . Reviewed COPD action Plan. She stated she knows to call 911. . She states that she has Tele med visit scheduled with her Pulmonologist and Cardiologist soon and she  is happy about that      . Discussed plans with patient for ongoing care management follow up and provided patient with direct contact information for care management team  Patient Self Care Activities:  . Patient verbalizes understanding of plan Self administers medications as prescribed . Calls pharmacy for medication refills . Calls provider office for new concerns or questions . Unable to independently self manage home health agency  Please see past updates related to this goal by clicking on the "Past Updates" button in the selected goal         Ms. Urquiza was given information about Care Management services today including:  1. Care Management services include personalized support from designated clinical staff supervised by her physician, including individualized plan of care and coordination with other care providers 2. 24/7 contact phone numbers for assistance for urgent and routine care needs. 3. The patient may stop CCM services at any time (effective at the end of the month) by phone call to the office staff.  Patient agreed to services and verbal consent obtained.   The patient verbalized understanding of instructions provided today and declined a print copy of patient instruction materials.    The care management team will reach out to the patient again over the next 14 days.   Lazaro Arms RN, BSN, [facility] Care  Management Coordinator [place] Phone: [phone] Fax: [phone]

## 2020-04-22 NOTE — Chronic Care Management (AMB) (Signed)
Care Management   Follow Up Note   04/22/2020 Name: [name] White MRN: [id number] DOB: [birth year]  Referred by: Richarda Osmond, DO Reason for referral : Appointment (COPD)   [name] White is a 64 y.o. year old female who is a primary care patient of Richarda Osmond, DO. The care management team was consulted for assistance with care management and care coordination needs.    Review of patient status, including review of consultants reports, relevant laboratory and other test results, and collaboration with appropriate care team members and the patient's provider was performed as part of comprehensive patient evaluation and provision of chronic care management services.    SDOH (Social Determinants of Health) assessments performed: No See Care Plan activities for detailed interventions related to [facility])     Advanced Directives: See Care Plan and Vynca application for related entries.   Goals Addressed              This Visit's Progress   .  My aid had to be tested for covid. (pt-stated)        CARE PLAN ENTRY (see longitudinal plan of care for additional care plan information)  Current Barriers:  . Chronic Disease Management support and education needs related to post discharge instructions,for IP event on  02/06/20 including medication review , MD follow up appointments and Home Health-=patient states that she went in to the hospital due to her aid that she had in the home was suspected of having covid.  She had some symptoms and was sent to the hospital to be tested .  She states that she did not have covid but she has pneumonia.  She states that she was treated and was stable and then sent home but she does not have Home health and will not use the company that she had before.  Nurse Case Manager Clinical Goal(s):  Marland Kitchen Over the next 30 days, patient will verbalize understanding of plan for home health  Interventions:  . Inter-disciplinary care team collaboration  (see longitudinal plan of care) . Evaluation of current treatment plan related to IP admission on 02-06-20 and patient's adherence to plan as established by provider. . Reviewed medications with patient and she has all of her meds . Collaborated with PCP regarding referral for hoe health .  Marland Kitchen Patient returned my call.  She stated that she called back when she was able to talk.  when the weather changed it affects her breathing. She was not able to talk long today. She let me know that she picked an agency for her PCS worker and she has contacted [facility] and they will contact them to get the process going. . Patient wants to get a flu shot and RNCM will need to see who will be able to go to the home to accommodate her. . Reviewed COPD action Plan. She stated she knows to call 911. . She states that she has Tele med visit scheduled with her Pulmonologist and Cardiologist soon and she is happy about that      . Discussed plans with patient for ongoing care management follow up and provided patient with direct contact information for care management team  Patient Self Care Activities:  . Patient verbalizes understanding of plan Self administers medications as prescribed . Calls pharmacy for medication refills . Calls provider office for new concerns or questions . Unable to independently self manage home health agency  Please see past updates related to this goal by  clicking on the "Past Updates" button in the selected goal          The care management team will reach out to the patient again over the next 14 days.   Lazaro Arms RN, BSN, [facility] Care Management Coordinator [place] Phone: [phone] Fax: [phone]

## 2020-04-22 NOTE — Chronic Care Management (AMB) (Signed)
  Care Management   Note  04/22/2020 Name: [name] MRN: [id number] DOB: 01/01/1956  [name] White is a 64 y.o. year old female who is a primary care patient of Richarda Osmond, DO and is actively engaged with the care management team. I reached out to Toole Desanctis by phone today to assist with re-scheduling a follow up visit with the RN Case Manager  Follow up plan: Unsuccessful telephone outreach attempt made. A HIPAA compliant phone message was left for the patient providing contact information and requesting a return call. The care management team will reach out to the patient again over the next 7 days. If patient returns call to provider office, please advise to call [place] at [phone].  [place] Management  Direct Dial: [phone]

## 2020-04-27 ENCOUNTER — Ambulatory Visit: Payer: Medicare HMO

## 2020-04-27 DIAGNOSIS — J9611 Chronic respiratory failure with hypoxia: Secondary | ICD-10-CM | POA: Diagnosis not present

## 2020-04-27 NOTE — Patient Instructions (Signed)
Visit Information  Goals Addressed              This Visit's Progress   .  My aid had to be tested for covid. (pt-stated)        CARE PLAN ENTRY (see longitudinal plan of care for additional care plan information)  Current Barriers:  . Chronic Disease Management support and education needs related to post discharge instructions,for IP event on  02/06/20 including medication review , MD follow up appointments and Home Health-=patient states that she went in to the hospital due to her aid that she had in the home was suspected of having covid.  She had some symptoms and was sent to the hospital to be tested .  She states that she did not have covid but she has pneumonia.  She states that she was treated and was stable and then sent home but she does not have Home health and will not use the company that she had before.  Nurse Case Manager Clinical Goal(s):  Marland Kitchen Over the next 30 days, patient will verbalize understanding of plan for home health  Interventions:  . Inter-disciplinary care team collaboration (see longitudinal plan of care) . Evaluation of current treatment plan related to IP admission on 02-06-20 and patient's adherence to plan as established by provider. . Reviewed medications with patient and she has all of her meds . Collaborated with PCP regarding referral for hoe health . Patient called me today and wanted me to check with her pcp if her orders for physical therapy had been signed.  She stated that they had been sent to the office and they were waiting for them.   . I informed her that I could send her pcp a message to ask.  She stated that she was fine with that but asked if I could text her the answer because she could not talk any longer. . Her pcp messaged me back and stated that she thought that she signed the orders last week but could not be sure and would check her box tomorrow.  The patient was fine with that.  . Discussed plans with patient for ongoing care  management follow up and provided patient with direct contact information for care management team  Patient Self Care Activities:  . Patient verbalizes understanding of plan Self administers medications as prescribed . Calls pharmacy for medication refills . Calls provider office for new concerns or questions . Unable to independently self manage home health agency  Please see past updates related to this goal by clicking on the "Past Updates" button in the selected goal         Ms. Nembhard was given information about Care Management services today including:  1. Care Management services include personalized support from designated clinical staff supervised by her physician, including individualized plan of care and coordination with other care providers 2. 24/7 contact phone numbers for assistance for urgent and routine care needs. 3. The patient may stop CCM services at any time (effective at the end of the month) by phone call to the office staff.  Patient agreed to services and verbal consent obtained.   The patient verbalized understanding of instructions provided today and declined a print copy of patient instruction materials.   Plan: RNCM will follow up at next scheduled interval.  Lazaro Arms RN, BSN, [facility] Care Management Coordinator [place] Phone: [phone] Fax: [phone]

## 2020-04-27 NOTE — Chronic Care Management (AMB) (Signed)
Care Management   Follow Up Note   04/27/2020 Name: [name] White MRN: [id number] DOB: 04-Nov-1955  Referred by: Richarda Osmond, DO Reason for referral : Care Coordination (Physical therapy)   [name] White is a 64 y.o. year old female who is a primary care patient of Doristine Mango L, DO. The care management team was consulted for assistance with care management and care coordination needs.    Review of patient status, including review of consultants reports, relevant laboratory and other test results, and collaboration with appropriate care team members and the patient's provider was performed as part of comprehensive patient evaluation and provision of chronic care management services.    SDOH (Social Determinants of Health) assessments performed: No See Care Plan activities for detailed interventions related to [facility])     Advanced Directives: See Care Plan and Vynca application for related entries.   Goals Addressed              This Visit's Progress   .  My aid had to be tested for covid. (pt-stated)        CARE PLAN ENTRY (see longitudinal plan of care for additional care plan information)  Current Barriers:  . Chronic Disease Management support and education needs related to post discharge instructions,for IP event on  02/06/20 including medication review , MD follow up appointments and Home Health-=patient states that she went in to the hospital due to her aid that she had in the home was suspected of having covid.  She had some symptoms and was sent to the hospital to be tested .  She states that she did not have covid but she has pneumonia.  She states that she was treated and was stable and then sent home but she does not have Home health and will not use the company that she had before.  Nurse Case Manager Clinical Goal(s):  Marland Kitchen Over the next 30 days, patient will verbalize understanding of plan for home health  Interventions:  . Inter-disciplinary care  team collaboration (see longitudinal plan of care) . Evaluation of current treatment plan related to IP admission on 02-06-20 and patient's adherence to plan as established by provider. . Reviewed medications with patient and she has all of her meds . Collaborated with PCP regarding referral for hoe health . Patient called me today and wanted me to check with her pcp if her orders for physical therapy had been signed.  She stated that they had been sent to the office and they were waiting for them.   . I informed her that I could send her pcp a message to ask.  She stated that she was fine with that but asked if I could text her the answer because she could not talk any longer. . Her pcp messaged me back and stated that she thought that she signed the orders last week but could not be sure and would check her box tomorrow.  The patient was fine with that.  . Discussed plans with patient for ongoing care management follow up and provided patient with direct contact information for care management team  Patient Self Care Activities:  . Patient verbalizes understanding of plan Self administers medications as prescribed . Calls pharmacy for medication refills . Calls provider office for new concerns or questions . Unable to independently self manage home health agency  Please see past updates related to this goal by clicking on the "Past Updates" button in the selected goal  Plan: RNCM will follow up at next scheduled interval.  Lazaro Arms RN, BSN, [facility] Care Management Coordinator [place] Phone: [phone] Fax: [phone]

## 2020-04-28 ENCOUNTER — Ambulatory Visit: Payer: Medicare HMO | Admitting: Licensed Clinical Social Worker

## 2020-04-28 ENCOUNTER — Ambulatory Visit: Payer: Medicare HMO

## 2020-04-28 DIAGNOSIS — J9601 Acute respiratory failure with hypoxia: Secondary | ICD-10-CM | POA: Diagnosis not present

## 2020-04-28 DIAGNOSIS — E1159 Type 2 diabetes mellitus with other circulatory complications: Secondary | ICD-10-CM | POA: Diagnosis not present

## 2020-04-28 DIAGNOSIS — Z741 Need for assistance with personal care: Secondary | ICD-10-CM

## 2020-04-28 DIAGNOSIS — J449 Chronic obstructive pulmonary disease, unspecified: Secondary | ICD-10-CM | POA: Diagnosis not present

## 2020-04-28 NOTE — Progress Notes (Signed)
RN CM spoke with patient on 04/27/2020

## 2020-04-28 NOTE — Chronic Care Management (AMB) (Signed)
   Social Work  Care Management Collaboration 04/28/2020 Name: [name] White MRN: [id number] DOB: Aug 02, 1955 [name] White is a 64 y.o. year old female who sees Richarda Osmond, DO for primary care. LCSW was consulted by CCM RN to assistance patient with  [place]. Patient called [facility] and was informed that she needs a new referral from PCP.   Intervention: Patient was not interviewed or contacted during this encounter.  LCSW collaborated with CCM RN for ongoing needs. [place] to verify that a new referral is needed. in basket message sent to PCP requesting her to see patient via virtual visit.  Barriers:   Unable to complete new referral due to patient not seen in clinic in past 90 days  patient is home bound and unable to into the office.   Plan: LCSW will continue to collaborate with CCM RN and PCP for personal care service needs      Review of patient status, including review of consultants reports, relevant laboratory and other test results, and collaboration with appropriate care team members and the patient's provider was performed as part of comprehensive patient evaluation and provision of chronic care management services.    [name], [place] / [place]   [phone] 4:23 PM

## 2020-04-28 NOTE — Chronic Care Management (AMB) (Signed)
   [place] Management Collaboration 04/28/2020 Name: [name] White MRN: [id number] DOB: April 05, 1956   [name] White is a 64 y.o. year old female who sees Richarda Osmond, DO for primary care. RNCM contacted Deanna with [place] to assistance patient with  Care Coordination for physical therapy.     Intervention: Patient was not interviewed or contacted during this encounter. Review of patient status, including review of consultants reports, relevant laboratory and other test results, and collaboration with appropriate care team members and the patient's provider was performed as part of comprehensive patient evaluation and provision of chronic care management services.  RNCM spoke with Deanna about the patient's concerns for physical therapy.  Deanna stated that she did not see an order that was recently sent to the office for physical therapy but would talk with the physical therapist and get back with me.    Plan:  1. RNCM will wait for follow up call from [place] at [facility] if no return call in the next 2 days RNCM will call them back.   Lazaro Arms RN, BSN, [facility] Care Management Coordinator [place] Phone: [phone] Fax: [phone]

## 2020-04-29 ENCOUNTER — Ambulatory Visit: Payer: Medicare HMO

## 2020-04-29 ENCOUNTER — Encounter: Payer: Self-pay | Admitting: Student in an Organized Health Care Education/Training Program

## 2020-04-29 ENCOUNTER — Telehealth: Payer: Self-pay

## 2020-04-29 DIAGNOSIS — J449 Chronic obstructive pulmonary disease, unspecified: Secondary | ICD-10-CM | POA: Diagnosis not present

## 2020-04-29 NOTE — Chronic Care Management (AMB) (Signed)
RN  Care Management   Follow Up Note   04/29/2020 Name: ROWYNN MCWEENEY MRN: [id number] DOB: September 03, 1955  Reason for referral : Care Coordination (PCS and [place])   AHMIA COLFORD is a 64 y.o. year old female who is a primary care patient of Richarda Osmond, DO. The care management team was consulted for assistance with care management and care coordination needs.    Subjective: "I am calling to follow up on appointments"      Goals Addressed              This Visit's Progress     My aid had to be tested for covid. (pt-stated)        CARE PLAN ENTRY (see longitudinal plan of care for additional care plan information)  Current Barriers:   Chronic Disease Management support and education needs related to post discharge instructions,for IP event on  02/06/20 including medication review , MD follow up appointments and Home Health-=patient states that she went in to the hospital due to her aid that she had in the home was suspected of having covid.  She had some symptoms and was sent to the hospital to be tested .  She states that she did not have covid but she has pneumonia.  She states that she was treated and was stable and then sent home but she does not have Home health and will not use the company that she had before.  Nurse Case Manager Clinical Goal(s):   Over the next 30 days, patient will verbalize understanding of plan for home health  Interventions:   Inter-disciplinary care team collaboration (see longitudinal plan of care)  Evaluation of current treatment plan related to IP admission on 02-06-20 and patient's adherence to plan as established by provider.  Reviewed medications with patient and she has all of her meds  Collaborated with PCP regarding referral for hoe health  Patient called and stated that she has been in contact with [facility] the Physical therapist at [place] She is coming to the home tomorrow between 5 and 530.  She will do an  evaluation for Mrs. Sykora to see if she can do inpatient physical therapy.  While on the phone I advised her that I spoke with [name] and she let me know that she needs a virtual visit with her PCP for  [facility]. The Front desk was able to help Mrs. Masters schedule an appointment for 05/05/20 at 950 am.  She was so appreciative for the help.    Discussed plans with patient for ongoing care management follow up and provided patient with direct contact information for care management team  Patient Self Care Activities:   Patient verbalizes understanding of plan Self administers medications as prescribed  Calls pharmacy for medication refills  Calls provider office for new concerns or questions  Unable to independently self manage home health agency  Please see past updates related to this goal by clicking on the "Past Updates" button in the selected goal         Review of patient status, including review of consultants reports, relevant laboratory and other test results, and collaboration with appropriate care team members and the patient's provider was performed as part of comprehensive patient evaluation and provision of chronic care management services.    SDOH (Social Determinants of Health) assessments performed: No See Care Plan activities for detailed interventions related to [facility])  Plan: Telephone follow up with [facility] over the next next 14 days.   Lazaro Arms RN, BSN, [facility] Care Management Coordinator [place] Phone: [phone] Fax: [phone]

## 2020-04-29 NOTE — Patient Instructions (Signed)
Visit Information  Goals Addressed              This Visit's Progress     My aid had to be tested for covid. (pt-stated)        CARE PLAN ENTRY (see longitudinal plan of care for additional care plan information)  Current Barriers:   Chronic Disease Management support and education needs related to post discharge instructions,for IP event on  02/06/20 including medication review , MD follow up appointments and Home Health-=patient states that she went in to the hospital due to her aid that she had in the home was suspected of having covid.  She had some symptoms and was sent to the hospital to be tested .  She states that she did not have covid but she has pneumonia.  She states that she was treated and was stable and then sent home but she does not have Home health and will not use the company that she had before.  Nurse Case Manager Clinical Goal(s):   Over the next 30 days, patient will verbalize understanding of plan for home health  Interventions:   Inter-disciplinary care team collaboration (see longitudinal plan of care)  Evaluation of current treatment plan related to IP admission on 02-06-20 and patient's adherence to plan as established by provider.  Reviewed medications with patient and she has all of her meds  Collaborated with PCP regarding referral for hoe health  Patient called and stated that she has been in contact with [facility] the Physical therapist at [name] She is coming to the home tomorrow between 5 and 530.  She will do an evaluation for [name] White to see if she can do inpatient physical therapy.  While on the phone I advised her that I spoke with [name] and she let me know that she needs a virtual visit with her PCP for  [facility]. The Front desk was able to help [name] White schedule an appointment for 05/05/20 at 950 am.  She was so appreciative for the help.    Discussed plans with patient for ongoing care management follow up  and provided patient with direct contact information for care management team  Patient Self Care Activities:   Patient verbalizes understanding of plan Self administers medications as prescribed  Calls pharmacy for medication refills  Calls provider office for new concerns or questions  Unable to independently self manage home health agency  Please see past updates related to this goal by clicking on the "Past Updates" button in the selected goal         [name] White was given information about Care Management services today including:  1. Care Management services include personalized support from designated clinical staff supervised by her physician, including individualized plan of care and coordination with other care providers 2. 24/7 contact phone numbers for assistance for urgent and routine care needs. 3. The patient may stop CCM services at any time (effective at the end of the month) by phone call to the office staff.  Patient agreed to services and verbal consent obtained.   The patient verbalized understanding of instructions provided today and declined a print copy of patient instruction materials.   Plan: Telephone follow up with [place] [facility] over the next next 14 days.   Lazaro Arms RN, BSN, [facility] Care Management Coordinator [place] Phone: [phone] Fax: [phone]

## 2020-04-29 NOTE — Telephone Encounter (Signed)
Charrise from [facility] [place] calling for PT verbal orders as follows:  1 time(s) weekly for 1 week(s). This will replace missed visit from last week.   Verbal orders given per [facility] protocol  Talbot Grumbling, RN

## 2020-04-30 DIAGNOSIS — I480 Paroxysmal atrial fibrillation: Secondary | ICD-10-CM | POA: Diagnosis not present

## 2020-04-30 DIAGNOSIS — J9622 Acute and chronic respiratory failure with hypercapnia: Secondary | ICD-10-CM | POA: Diagnosis not present

## 2020-04-30 DIAGNOSIS — I5033 Acute on chronic diastolic (congestive) heart failure: Secondary | ICD-10-CM | POA: Diagnosis not present

## 2020-04-30 DIAGNOSIS — J189 Pneumonia, unspecified organism: Secondary | ICD-10-CM | POA: Diagnosis not present

## 2020-04-30 DIAGNOSIS — I110 Hypertensive heart disease with heart failure: Secondary | ICD-10-CM | POA: Diagnosis not present

## 2020-04-30 DIAGNOSIS — G4733 Obstructive sleep apnea (adult) (pediatric): Secondary | ICD-10-CM | POA: Diagnosis not present

## 2020-04-30 DIAGNOSIS — J45909 Unspecified asthma, uncomplicated: Secondary | ICD-10-CM | POA: Diagnosis not present

## 2020-04-30 DIAGNOSIS — E119 Type 2 diabetes mellitus without complications: Secondary | ICD-10-CM | POA: Diagnosis not present

## 2020-04-30 DIAGNOSIS — J9621 Acute and chronic respiratory failure with hypoxia: Secondary | ICD-10-CM | POA: Diagnosis not present

## 2020-05-01 ENCOUNTER — Telehealth: Payer: Self-pay

## 2020-05-01 ENCOUNTER — Telehealth: Payer: Self-pay | Admitting: Licensed Clinical Social Worker

## 2020-05-01 NOTE — Chronic Care Management (AMB) (Signed)
  Care Management   Clinical Social Work Follow Up   05/01/2020 Name: [name] White MRN: [id number] DOB: 19-Jan-1956 Referred by: Richarda Osmond, DO  Reason for referral : Care Coordination (short-term rehab)  [name] White is a 64 y.o. year old female who is a primary care patient of Richarda Osmond, DO.   LCSW received message from Dr. Ouida Sills to assist patient with inpatient rehab based on recommendation from [place].   Assessment: Patient continues to experience experience difficulty with mobility and completed her last day with [facility] PT.    Recommendation: Patient may benefit from, and is in agreement to go to inpatient rehab to improve mobility.  Intervention:  LCSW called HH PT [place] [phone] to request copy of PT notes with inpatient rehab recommendation.  Collaborated with CCM RN who spoke with patient.  Patient is excited and in agreement with moving forward with placement.   Plan: LCSW will start placement process.  Patient understands the process will take several weeks.   Review of patient status, including review of consultants reports, relevant laboratory and other test results, and collaboration with appropriate care team members and the patient's provider was performed as part of comprehensive patient evaluation and provision of care management services.   [name], [place] / [place]   [phone] 4:09 PM

## 2020-05-01 NOTE — Telephone Encounter (Signed)
Jerl Mina, [facility] PT, calls nurse line reporting last day with patient for [facility] PT. Charri reports the patient is not progressing and would benefit from inpatient rehab. Charri reports the patient is continuing to have SOB and knee pain that prevents her from doing daily tasks like answering the door. Patient can not get herself to the bathroom and relies on bedside commode. Patient does live alone and has someone who "checks in" on her, however needs more care. Will forward to PCP.

## 2020-05-05 ENCOUNTER — Ambulatory Visit: Payer: Medicare HMO

## 2020-05-05 ENCOUNTER — Telehealth (INDEPENDENT_AMBULATORY_CARE_PROVIDER_SITE_OTHER): Payer: Medicare HMO | Admitting: Student in an Organized Health Care Education/Training Program

## 2020-05-05 ENCOUNTER — Other Ambulatory Visit: Payer: Self-pay

## 2020-05-05 DIAGNOSIS — R0603 Acute respiratory distress: Secondary | ICD-10-CM | POA: Diagnosis not present

## 2020-05-05 DIAGNOSIS — R296 Repeated falls: Secondary | ICD-10-CM

## 2020-05-05 DIAGNOSIS — F332 Major depressive disorder, recurrent severe without psychotic features: Secondary | ICD-10-CM

## 2020-05-05 DIAGNOSIS — Z7189 Other specified counseling: Secondary | ICD-10-CM

## 2020-05-05 DIAGNOSIS — Z741 Need for assistance with personal care: Secondary | ICD-10-CM

## 2020-05-05 NOTE — NC FL2 (Signed)
[place] LEVEL OF CARE SCREENING TOOL     IDENTIFICATION  Patient Name: [name] White Birthdate: Oct 26, 1955 Sex: female Admission Date (Current Location): (Not on file)  [place] and [place] Number:  [name] [id number] [place] and Address:         Provider Number: [id number]  Attending Physician Name and Address:  Dr. Doristine Mango  [place] [address]  Relative Name and Phone Number:   Alta Corning ( niece) [phone]    Current Level of Care: Home Recommended Level of Care: [place] Prior Approval Number:    Date Approved/Denied:   PASRR Number: requested  Discharge Plan:      Current Diagnoses: Patient Active Problem List   Diagnosis Date Noted  . Acute exacerbation of CHF (congestive heart failure) ([place]) 03/12/2020  . Community acquired pneumonia   . Acute respiratory failure with hypoxia ([place]) 02/06/2020  . SOB (shortness of breath) 11/24/2019  . Respiratory distress   . Respiratory failure, acute-on-chronic ([place]) 11/09/2019  . Seasonal allergies 10/03/2019  . Debility 08/02/2019  . Hypertension associated with diabetes ([place])   . Weight gain due to medication 06/05/2019  . Acute on chronic heart failure with preserved ejection fraction ([place]) 03/14/2019  . Acquired thrombophilia ([place]) due to A-Fib 03/07/2019  . Heart failure with preserved ejection fraction ([place]) 03/06/2019  . Obesity hypoventilation syndrome ([place]) 03/06/2019  . Lower GI bleed   . COPD exacerbation ([place]) 02/26/2019  . Urge incontinence 01/27/2019  . Shortness of breath 08/29/2018  . OSA (obstructive sleep apnea) 07/05/2018  . Respiratory failure ([place]) 07/05/2018  . Severe episode of recurrent major depressive disorder, without psychotic features ([place])   . MDD (major depressive disorder), recurrent episode, severe ([place]) 09/07/2015  . Atrial flutter ([place]) 07/29/2015  . Asthma 11/12/2014  . History of hypertension  11/05/2014  . Paroxysmal atrial fibrillation ([place]) 11/03/2014  . Chronic anticoagulation 11/03/2014  . Major depressive disorder 11/03/2014  . History of pulmonary embolus (PE) 11/03/2014  . MDD (major depressive disorder), recurrent severe, without psychosis ([place]) 03/05/2014  . Hot flashes 04/22/2011  . OVERACTIVE BLADDER 04/18/2008  . Osteoarthritis of both knees 04/18/2008  . Osteoarthritis involving multiple joints on both sides of body 09/14/2007  . Morbid obesity ([place]) 08/24/2006  . RESTLESS LEGS SYNDROME 08/24/2006  . HYPERTENSION, BENIGN SYSTEMIC 08/24/2006  . RHINITIS, ALLERGIC 08/24/2006  . GASTROESOPHAGEAL REFLUX, NO ESOPHAGITIS 08/24/2006    Orientation RESPIRATION BLADDER Height & Weight     Self, Time, Situation, Place  O2 (6 to 7 L) Incontinent (at times needs help go to bathroom) Weight:  350lb Height:   5'5"  BEHAVIORAL SYMPTOMS/MOOD NEUROLOGICAL BOWEL NUTRITION STATUS      Continent Diet (heart healthy, low-sodium)  AMBULATORY STATUS COMMUNICATION OF NEEDS Skin   Extensive Assist Verbally Normal (no concerns noted)                       Personal Care Assistance Level of Assistance  Bathing, Dressing Bathing Assistance: Maximum assistance   Dressing Assistance: Maximum assistance     Functional Limitations Info             SPECIAL CARE FACTORS FREQUENCY  PT (By licensed PT), OT (By licensed OT)     PT Frequency: 5 times per week OT Frequency: 5 tims per week            Contractures Contractures Info: Not present    Additional Factors Info  Code Status,  Allergies Code Status Info: DNR Allergies Info: Caffeine;Hydralazine Hcl; Hydrocodone; Ciprofloxacin; Erythromycin; Lisinopril; Oxycodone; Sulfamethoxazole-trimethoprim  ; tape           Current Medications (05/05/2020):  This is the current active medication list Current Outpatient Medications  Medication Sig Dispense Refill  . acetaminophen (TYLENOL) 325 MG tablet Take 2 tablets  (650 mg total) by mouth every 6 (six) hours as needed for mild pain.    Marland Kitchen albuterol (VENTOLIN HFA) 108 (90 Base) MCG/ACT inhaler Inhale 2 puffs into the lungs every 4 (four) hours as needed for wheezing or shortness of breath. 18 g 1  . apixaban (ELIQUIS) 5 MG TABS tablet Take 1 tablet (5 mg total) by mouth 2 (two) times daily. 180 tablet 1  . atorvastatin (LIPITOR) 40 MG tablet Take 1 tablet (40 mg total) by mouth daily. (Patient taking differently: Take 40 mg by mouth every evening. ) 90 tablet 3  . azelastine (ASTELIN) 0.1 % nasal spray Place 2 sprays into both nostrils 2 (two) times daily. Use in each nostril as directed (Patient taking differently: Place 2 sprays into both nostrils daily. ) 30 mL 0  . azelastine (OPTIVAR) 0.05 % ophthalmic solution INSTILL 1 DROP IN BOTH EYES TWICE DAILY AS NEEDED FOR ITCHY EYES (Patient taking differently: Place 1 drop into both eyes 2 (two) times daily. ) 6 mL 1  . cetirizine (ZYRTEC) 10 MG tablet TAKE 1 TABLET(10 MG) BY MOUTH DAILY (Patient taking differently: Take 10 mg by mouth daily. ) 30 tablet 1  . cyclobenzaprine (FLEXERIL) 5 MG tablet TAKE 1 TABLET(5 MG) BY MOUTH TWICE DAILY AS NEEDED FOR MUSCLE SPASMS 30 tablet 0  . diclofenac sodium (VOLTAREN) 1 % GEL APPLY 2 GRAMS EXTERNALLY TO THE AFFECTED AREA FOUR TIMES DAILY (Patient taking differently: Apply 2 g topically 4 (four) times daily as needed (arthritis pain - knees, shoulder and thighs). ) 100 g 0  . diltiazem (CARDIZEM CD) 120 MG 24 hr capsule Take 1 capsule (120 mg total) by mouth daily. Please keep upcoming appt for future refills 30 capsule 0  . flecainide (TAMBOCOR) 100 MG tablet TAKE 1 TABLET BY MOUTH TWICE DAILY 180 tablet 3  . fluticasone (FLONASE) 50 MCG/ACT nasal spray Place 2 sprays into both nostrils daily as needed for allergies or rhinitis.     . furosemide (LASIX) 40 MG tablet TAKE 1 AND 1/2 TABLETS(60 MG) BY MOUTH DAILY (Patient taking differently: Take 60 mg by mouth daily. ) 135 tablet  0  . gabapentin (NEURONTIN) 100 MG capsule Take 2 capsules (200 mg total) by mouth 3 (three) times daily. 180 capsule 1  . guaiFENesin (MUCINEX) 600 MG 12 hr tablet Take 1 tablet (600 mg total) by mouth 2 (two) times daily as needed for cough. 30 tablet 0  . hydrOXYzine (VISTARIL) 25 MG capsule Take 1 capsule (25 mg total) by mouth daily as needed for anxiety. 30 capsule 0  . ipratropium-albuterol (DUONEB) 0.5-2.5 (3) MG/3ML SOLN Take 3 mLs by nebulization every 6 (six) hours as needed (shortness of breath, wheezing). 360 mL 0  . Melatonin 3 MG TABS Take 1 tablet (3 mg total) by mouth at bedtime as needed (sleep). (Patient taking differently: Take 3 mg by mouth at bedtime. ) 30 tablet 0  . metoprolol tartrate (LOPRESSOR) 25 MG tablet TAKE 1/2 TABLET(12.5 MG) BY MOUTH TWICE DAILY (Patient taking differently: Take 12.5 mg by mouth 2 (two) times daily. ) 90 tablet 0  . Multiple Vitamin (MULTIVITAMIN) tablet Take 1 tablet  by mouth daily.    Marland Kitchen omeprazole (PRILOSEC) 20 MG capsule Take 1 capsule (20 mg total) by mouth daily. 90 capsule 0  . OXYGEN Inhale 6-7 L/min into the lungs continuous.     . potassium chloride (KLOR-CON) 10 MEQ tablet TAKE 2 TABLETS(20 MEQ) BY MOUTH DAILY (Patient taking differently: Take 20 mEq by mouth daily. ) 180 tablet 0  . PRESCRIPTION MEDICATION See admin instructions. [employer]-  At bedtime and during any time of rest    . venlafaxine (EFFEXOR) 37.5 MG tablet Take 1 tablet (37.5 mg total) by mouth daily with breakfast. Take with a 150 mg ER capsule every morning (Patient taking differently: Take 37.5 mg by mouth daily with breakfast. Take with a 150mg  ER capsule every morning for a total of 187.5mg ) 90 tablet 0  . venlafaxine XR (EFFEXOR-XR) 150 MG 24 hr capsule Take 1 capsule (150 mg total) by mouth daily with breakfast. Take with a 37.5 mg tablet every morning (Patient taking differently: Take 150 mg by mouth daily with breakfast. Take with a 37.5mg   tablet every morning for a total of 187.5mg ) 90 capsule 3   No current facility-administered medications for this visit.   Discharge Medications:  Relevant Imaging Results:  Relevant Lab Results:   Additional Information  SS# [id number]  Maurine Cane, LCSW

## 2020-05-05 NOTE — Chronic Care Management (AMB) (Signed)
Care Management   Clinical Social Work Follow Up   05/05/2020 Name: MYRELLA FAHS MRN: [id number] DOB: [birth year] Referred by: Richarda Osmond, DO  Reason for referral : Care Coordination (placement)  [name] White is a 64 y.o. year old female who is a primary care patient of Richarda Osmond, DO.  Reason for follow-up: Request from patient, PCP and PT to assist with placement for short term rehab.  Assessment: Patient continues to experience difficulty with mobility and would benefit from inpatient rehab.   Plan: FL2 completed in Epic and routed to PCP to sign.  Advance Directive Status:  ; not addressed during this encounter.  SDOH (Social Determinants of Health) assessments performed: No new needs identified   Patient Care Plan: Social Work  Problem Identified: Difficult with mobility impacting Quality of Life   Goal: Facility placement for rehab   Start Date: 05/05/2020  Note:   Current Barriers:  Patient's health has declined and longer able to meet ADL's at home. Acknowledges deficits with placement process and needs support in order to meet this need Clinical Goal(s): Over the next 10 to 30 days patient will be placed in a facility, patient and family will work with LCSW, to coordinate needs for short term skilled rehab Placement Interventions: . Assessed needs and facility placement process;  . Obtained patient's verbal permission to fax FL2 and supporting document to facilities as well as obtain PASSAR  . initiated process to obtain PASSAR- Pending   . Collaborated with primary care provider for completion of FL2 . Will Collaborate with identified facilities and assist patient as needed to understand facility selection process . Assist patient with faxing FL2 to identified facilities once completed and signed by PCP Patient Goals/Self-Care Activities: Over the next 20 days . Select a facilities or take first available . Call to check on availability and have  family facilities Follow Up Plan:  . SW will follow up with patient and collaborate with CCM RN for patient's ongoing needs          Outpatient Encounter Medications as of 05/05/2020  Medication Sig Note  . acetaminophen (TYLENOL) 325 MG tablet Take 2 tablets (650 mg total) by mouth every 6 (six) hours as needed for mild pain.   Marland Kitchen albuterol (VENTOLIN HFA) 108 (90 Base) MCG/ACT inhaler Inhale 2 puffs into the lungs every 4 (four) hours as needed for wheezing or shortness of breath.   Marland Kitchen apixaban (ELIQUIS) 5 MG TABS tablet Take 1 tablet (5 mg total) by mouth 2 (two) times daily.   Marland Kitchen atorvastatin (LIPITOR) 40 MG tablet Take 1 tablet (40 mg total) by mouth daily. (Patient taking differently: Take 40 mg by mouth every evening. )   . azelastine (ASTELIN) 0.1 % nasal spray Place 2 sprays into both nostrils 2 (two) times daily. Use in each nostril as directed (Patient taking differently: Place 2 sprays into both nostrils daily. ) 06/15/2019: Pt uses both Astelin and flonase every morning  . azelastine (OPTIVAR) 0.05 % ophthalmic solution INSTILL 1 DROP IN BOTH EYES TWICE DAILY AS NEEDED FOR ITCHY EYES (Patient taking differently: Place 1 drop into both eyes 2 (two) times daily. )   . cetirizine (ZYRTEC) 10 MG tablet TAKE 1 TABLET(10 MG) BY MOUTH DAILY (Patient taking differently: Take 10 mg by mouth daily. )   . cyclobenzaprine (FLEXERIL) 5 MG tablet TAKE 1 TABLET(5 MG) BY MOUTH TWICE DAILY AS NEEDED FOR MUSCLE SPASMS   . diclofenac sodium (VOLTAREN) 1 % GEL  APPLY 2 GRAMS EXTERNALLY TO THE AFFECTED AREA FOUR TIMES DAILY (Patient taking differently: Apply 2 g topically 4 (four) times daily as needed (arthritis pain - knees, shoulder and thighs). )   . diltiazem (CARDIZEM CD) 120 MG 24 hr capsule Take 1 capsule (120 mg total) by mouth daily. Please keep upcoming appt for future refills   . flecainide (TAMBOCOR) 100 MG tablet TAKE 1 TABLET BY MOUTH TWICE DAILY   . fluticasone (FLONASE) 50 MCG/ACT nasal spray  Place 2 sprays into both nostrils daily as needed for allergies or rhinitis.  06/15/2019: Pt uses both Astelin and Flonase every morning  . furosemide (LASIX) 40 MG tablet TAKE 1 AND 1/2 TABLETS(60 MG) BY MOUTH DAILY (Patient taking differently: Take 60 mg by mouth daily. ) 02/06/2020: 1.5 tablets = 60 mg  . gabapentin (NEURONTIN) 100 MG capsule Take 2 capsules (200 mg total) by mouth 3 (three) times daily.   Marland Kitchen guaiFENesin (MUCINEX) 600 MG 12 hr tablet Take 1 tablet (600 mg total) by mouth 2 (two) times daily as needed for cough.   . hydrOXYzine (VISTARIL) 25 MG capsule Take 1 capsule (25 mg total) by mouth daily as needed for anxiety.   Marland Kitchen ipratropium-albuterol (DUONEB) 0.5-2.5 (3) MG/3ML SOLN Take 3 mLs by nebulization every 6 (six) hours as needed (shortness of breath, wheezing).   . Melatonin 3 MG TABS Take 1 tablet (3 mg total) by mouth at bedtime as needed (sleep). (Patient taking differently: Take 3 mg by mouth at bedtime. )   . metoprolol tartrate (LOPRESSOR) 25 MG tablet TAKE 1/2 TABLET(12.5 MG) BY MOUTH TWICE DAILY (Patient taking differently: Take 12.5 mg by mouth 2 (two) times daily. )   . Multiple Vitamin (MULTIVITAMIN) tablet Take 1 tablet by mouth daily.   Marland Kitchen omeprazole (PRILOSEC) 20 MG capsule Take 1 capsule (20 mg total) by mouth daily.   . OXYGEN Inhale 6-7 L/min into the lungs continuous.    . potassium chloride (KLOR-CON) 10 MEQ tablet TAKE 2 TABLETS(20 MEQ) BY MOUTH DAILY (Patient taking differently: Take 20 mEq by mouth daily. )   . PRESCRIPTION MEDICATION See admin instructions. Trilogy [employer]-  At bedtime and during any time of rest   . venlafaxine (EFFEXOR) 37.5 MG tablet Take 1 tablet (37.5 mg total) by mouth daily with breakfast. Take with a 150 mg ER capsule every morning (Patient taking differently: Take 37.5 mg by mouth daily with breakfast. Take with a 150mg  ER capsule every morning for a total of 187.5mg ) 03/12/2020: .  . venlafaxine XR (EFFEXOR-XR) 150  MG 24 hr capsule Take 1 capsule (150 mg total) by mouth daily with breakfast. Take with a 37.5 mg tablet every morning (Patient taking differently: Take 150 mg by mouth daily with breakfast. Take with a 37.5mg  tablet every morning for a total of 187.5mg ) 03/12/2020: .   No facility-administered encounter medications on file as of 05/05/2020.   Review of patient status, including review of consultants reports, relevant laboratory and other test results, and collaboration with appropriate care team members and the patient's provider was performed as part of comprehensive patient evaluation and provision of care management services.   [name], Pax / [place]   [phone] 4:39 PM

## 2020-05-05 NOTE — Progress Notes (Signed)
[place] Telemedicine Visit  Patient consented to have virtual visit and was identified by name and date of birth. Method of visit: Telephone  Encounter participants: Patient: [name] White - located at home Provider: Richarda Osmond - located at [facility] Others (if applicable): [place]   Chief Complaint: mobility issues for deconditioning and dyspnea,  requesting home services  HPI: Called and left voicemail for appointment [phone]. Second attempt was able to reach patient.   Mobility limited per patient and PT assessment. Patient endorses struggling at home a lot lately. Also having depression in regards to decreased independence and mobility. Patient using bedside commode and depends. Unable to ambulate to bathroom. Has bedside baths. Needing full time assistance for getting out of bed. Requiring a lot of assistance for even in-bed movements.  Part of this debility is coming from generalized weakness and deconditioning and partly from gradually worsening dyspnea with exertion. Using Trilogy at all times unless eating, taking medicine, or needing to have conversations. Having frequent coughing unchanged from chronic. Using 7L [place] when having conversations with having dyspnea. Feels uncomfortable at rest but not dyspneic. + Post- nasal drainage. Physical therapy recommended inpatient rehab.   Going to use humidifier.   ROS: per HPI  Pertinent PMHx: h/o Afib, PE, HTN, HF, ARF. COPD, MDD, frequent falls  Exam:  There were no vitals taken for this visit.  Respiratory: raspy voice. Able to speak in full sentences while on 7L [place]  Assessment/Plan:  Respiratory distress Dyspnea with mild exertion. No hypoxia when using home oxygen as prescribed  Severe episode of recurrent major depressive disorder, without psychotic features ([place]) Worsening state due to lack of independence and mobility  Frequent falls Severe deconditioning from chronic illness.  Home PT  recommended inpatient rehab and further therapy to improve mobility and safety. I agree with this assessment. Patient has potential for improvement.    Time spent during visit with patient: 15 minutes

## 2020-05-05 NOTE — Patient Instructions (Signed)
°  Please call me directly [phone] if you have questions about the goals we discussed. Goals Addressed            This Visit's Progress    Short term Rehab       Patient Goals/Self-Care Activities: Over the next 20 days  Select a facilities or take first available  Call to check on availability and have family facilities       Ms. Finau received Care Management services today:  1. Care Management services include personalized support from designated clinical staff supervised by her physician, including individualized plan of care and coordination with other care providers 2. 24/7 contact [phone] for assistance for urgent and routine care needs. 3. Care Management are voluntary services and be declined at any time by calling the office.  Patient verbalizes understanding of instructions provided today.  Follow up plan: SW will follow up with patient by phone over the next as needed or collaborate with team  Maurine Cane, LCSW

## 2020-05-06 ENCOUNTER — Ambulatory Visit: Payer: Medicare HMO

## 2020-05-06 ENCOUNTER — Ambulatory Visit: Payer: Medicare HMO | Admitting: Licensed Clinical Social Worker

## 2020-05-06 DIAGNOSIS — R296 Repeated falls: Secondary | ICD-10-CM | POA: Insufficient documentation

## 2020-05-06 DIAGNOSIS — Z7189 Other specified counseling: Secondary | ICD-10-CM

## 2020-05-06 NOTE — Assessment & Plan Note (Signed)
Worsening state due to lack of independence and mobility

## 2020-05-06 NOTE — Chronic Care Management (AMB) (Signed)
RN  Care Management   Follow Up Note   05/06/2020 Name: [name] White MRN: [id number] DOB: 11-04-1955  Reason for referral : Chronic Care Management (COPD/Rehabilitation)   [name] White is a 64 y.o. year old female who is a primary care patient of Richarda Osmond, DO. The care management team was consulted for assistance with care management and care coordination needs.      Assessment:Called and spoke with th epatient about her COPD and Helped get information for Rehab placement.    Patient Care Plan: COPD (Adult)  Problem Identified: Disease Progression (COPD)   Goal: Disease Progression Minimized or Managed   Note:   Current Barriers:   Knowledge deficit related to basic COPD self care/management   Nurse Case Manager Clinical Goal(s):  Over the next 30 days, patient will be able to verbalize understanding of COPD action plan and when to seek appropriate levels of medical care  Over the next 90 days, patient will engage in lite exercise as tolerated to build/regain stamina and strength and reduce shortness of breath through activity tolerance   Interventions:   Provided patient with  verbal COPD education on self care/management/and exacerbation prevention   Provided patient with COPD action plan and reinforced importance of daily self assessment  Provided  verbal instructions on pursed lip breathing   healthy lifestyle promoted  necessary immunization facilitated  signs/symptoms of infection reviewed  medication-adherence assessment completed  self-awareness of symptom triggers encouraged  keep room cool and dark  avoid second hand smoke  identify and remove indoor air pollutants  listen for public air quality announcements every day   Patient Goals/Self-Care Activities:   Take medications as prescribed including inhalers  Practice and use pursed lip breathing for shortness of breath recovery and prevention  Self assess COPD action  plan zone and make appointment with provider if you have been in the yellow zone for 48 hours without improvement.  Engage in lite exercise as tolerated 3-5 days a week  Utilize infection prevention strategies to reduce risk of respiratory infection   Follow Up Plan: The care management team will reach out to the patient again over the next 10 days.     Problem Identified: Infection   Note:   . Chronic Disease Management support, education, and care coordination needs related to post nasal drip or per patient sinus infection as evidenced by patient saying she has mucous running down the back of her throat -   Clinical Goal(s) related to post nasal drip or per patient sinus infection as evidenced by patient saying she has mucous running down the back of her throat:  Over the next 10 days, patient will:  . Work with the care management team to address educational, disease management, and care coordination needs  . Begin or continue self health monitoring activities as directed today  use humidifier as directed by PCP . Call provider office for new or worsened signs and symptoms Oxygen saturation lower than established parameter  fever,chronic cough, mucous that may be clear , yellow or green in color. . Verbalize basic understanding of patient centered plan of care established today  Interventions related to post nasal drip or per patient sinus infection as evidenced by patient saying she has mucous running down the back of her throat:  . Evaluation of current treatment plans and patient's adherence to plan as established by provider . Assessed patient understanding of disease states . Assessed patient's education and care coordination needs . Provided  disease specific education to patient  . Collaborated with appropriate clinical care team members regarding patient needs  Patient Goals/Self Care Activities related to post nasal drip or per patient sinus infection as evidenced by patient  saying she has mucous running down the back of her throat:   . Patient verbalizes understanding of plan Self-administers medications as prescribed . Calls pharmacy for medication refills . Call's provider office for new concerns or questions  The care management team will reach out to the patient again over the next 5 days.         Review of patient status, including review of consultants reports, relevant laboratory and other test results, and collaboration with appropriate care team members and the patient's provider was performed as part of comprehensive patient evaluation and provision of chronic care management services.    SDOH (Social Determinants of Health) assessments performed: No See Care Plan activities for detailed interventions related to [facility])       The care management team will reach out to the patient again over the next 5-10 days.    Lazaro Arms RN, BSN, [facility] Care Management Coordinator [place] Phone: [phone] Fax: [phone]

## 2020-05-06 NOTE — Assessment & Plan Note (Signed)
Severe deconditioning from chronic illness.  Home PT recommended inpatient rehab and further therapy to improve mobility and safety. I agree with this assessment. Patient has potential for improvement.

## 2020-05-06 NOTE — Patient Instructions (Addendum)
Visit Information   .  Track and Manage My Symptoms for COPD and Infection        . Patient verbalizes understanding of plan Self-administers medications as prescribed . Calls pharmacy for medication refills . Call's provider office for new concerns or questions       Ms. Gallacher was given information about Care Management services today including:  1. Care Management services include personalized support from designated clinical staff supervised by her physician, including individualized plan of care and coordination with other care providers 2. 24/7 contact phone numbers for assistance for urgent and routine care needs. 3. The patient may stop CCM services at any time (effective at the end of the month) by phone call to the office staff.  Patient agreed to services and verbal consent obtained.   The patient verbalized understanding of instructions provided today and declined a print copy of patient instruction materials.   The care management team will reach out to the patient again over the next 5-10 days.   Lazaro Arms RN, BSN, [facility] [place] [place] Care Management Coordinator [place] Phone: [phone] Fax: [phone]

## 2020-05-06 NOTE — Chronic Care Management (AMB) (Signed)
Care Management   Clinical Social Work Follow Up   05/06/2020 Name: [name] White MRN: [id number] DOB: 1955-08-16 Referred by: Richarda Osmond, DO  Reason for referral : Care Coordination (rehab placement)  [name] White is a 64 y.o. year old female who is a primary care patient of Richarda Osmond, DO.  Reason for follow-up: work on rehab placement  Assessment: Patient does not want [facility], the one facility that is willing to review the FL2.  Patient believes her HHPT can find her a better facility.  LCSW contacted the following facilities that excepts patient's insurance on 05/06/2020  [facility] -Per Erick Blinks not taking new patients   [phone]  [facility] Not taking new patients  [phone]  [facility]- Per Roselyn Reef in admissions no beds available  [phone]  [facility] [place]  ([place]irst[place]) left voice message  Phone [phone]  cell [phone]  Fax #  [phone]  Ferrel Logan  Will go to e-mail  [phone]  [facility]- left voice message  [phone]  or [phone] Tracy's cell phone Fax # [phone]  [facility]: Lambert Leane W-  for [place] in admissions) not taking patients from home [phone] Fax#  [facility]- per [facility] requires patient to have a 3 night stay in the hospital Phone [phone] Fax #  [facility] [place] -willing to review FL2  [phone]    Fax # [phone]  [facility]- per [place] no beds, they are full [place]- No answer [place], [place] [phone] Plan: LCSW will wait to hear back from patient before moving forward with faxing FL2.  Will continue to work on getting PASSAR  Patient Care Plan: Social Work  Problem Identified: Difficult with mobility impacting  Quality of Life   Goal: Facility placement for rehab   Start Date: 05/05/2020  This Visit's Progress: Not on track  Note:   Current Barriers:  Patient's health has declined and longer able to meet ADL's at home. Acknowledges deficits with placement process and needs support in order to meet this need Clinical Goal(s): Over the next 10 to 30 days patient will be placed in a facility, patient and family will work with LCSW, to coordinate needs for short term skilled rehab Placement Interventions: . Assessed needs and facility placement process;  . Obtained patient's verbal permission to fax FL2 and supporting document to facilities as well as obtain PASSAR  . initiated process to obtain PASSAR- Pending   . Collaborated with primary care provider for completion of FL2 . Called several facilities  . Will Collaborate with identified facilities and assist patient as needed to understand facility selection process . Assist patient with faxing FL2 to identified facilities once completed and signed by PCP Patient Goals/Self-Care Activities: Over the next 20 days . Select a facilities or take first available . Call to check on availability and have family facilities  Follow Up Plan:  . SW will follow up with patient and collaborate with CCM RN for patient's ongoing needs         Outpatient Encounter Medications as of 05/06/2020  Medication Sig Note  . acetaminophen (TYLENOL) 325 MG tablet Take 2 tablets (650 mg total) by mouth every 6 (six) hours as needed for mild pain.   Marland Kitchen albuterol (VENTOLIN HFA) 108 (90 Base) MCG/ACT inhaler  Inhale 2 puffs into the lungs every 4 (four) hours as needed for wheezing or shortness of breath.   Marland Kitchen apixaban (ELIQUIS) 5 MG TABS tablet Take 1 tablet (5 mg total) by mouth 2 (two) times daily.   Marland Kitchen atorvastatin (LIPITOR) 40 MG tablet Take 1 tablet (40 mg total) by mouth daily. (Patient taking differently: Take 40 mg by mouth every evening. )   . azelastine (ASTELIN) 0.1 %  nasal spray Place 2 sprays into both nostrils 2 (two) times daily. Use in each nostril as directed (Patient taking differently: Place 2 sprays into both nostrils daily. ) 06/15/2019: Pt uses both Astelin and flonase every morning  . azelastine (OPTIVAR) 0.05 % ophthalmic solution INSTILL 1 DROP IN BOTH EYES TWICE DAILY AS NEEDED FOR ITCHY EYES (Patient taking differently: Place 1 drop into both eyes 2 (two) times daily. )   . cetirizine (ZYRTEC) 10 MG tablet TAKE 1 TABLET(10 MG) BY MOUTH DAILY (Patient taking differently: Take 10 mg by mouth daily. )   . cyclobenzaprine (FLEXERIL) 5 MG tablet TAKE 1 TABLET(5 MG) BY MOUTH TWICE DAILY AS NEEDED FOR MUSCLE SPASMS   . diclofenac sodium (VOLTAREN) 1 % GEL APPLY 2 GRAMS EXTERNALLY TO THE AFFECTED AREA FOUR TIMES DAILY (Patient taking differently: Apply 2 g topically 4 (four) times daily as needed (arthritis pain - knees, shoulder and thighs). )   . diltiazem (CARDIZEM CD) 120 MG 24 hr capsule Take 1 capsule (120 mg total) by mouth daily. Please keep upcoming appt for future refills   . flecainide (TAMBOCOR) 100 MG tablet TAKE 1 TABLET BY MOUTH TWICE DAILY   . fluticasone (FLONASE) 50 MCG/ACT nasal spray Place 2 sprays into both nostrils daily as needed for allergies or rhinitis.  06/15/2019: Pt uses both Astelin and Flonase every morning  . furosemide (LASIX) 40 MG tablet TAKE 1 AND 1/2 TABLETS(60 MG) BY MOUTH DAILY (Patient taking differently: Take 60 mg by mouth daily. ) 02/06/2020: 1.5 tablets = 60 mg  . gabapentin (NEURONTIN) 100 MG capsule Take 2 capsules (200 mg total) by mouth 3 (three) times daily.   Marland Kitchen guaiFENesin (MUCINEX) 600 MG 12 hr tablet Take 1 tablet (600 mg total) by mouth 2 (two) times daily as needed for cough.   . hydrOXYzine (VISTARIL) 25 MG capsule Take 1 capsule (25 mg total) by mouth daily as needed for anxiety.   Marland Kitchen ipratropium-albuterol (DUONEB) 0.5-2.5 (3) MG/3ML SOLN Take 3 mLs by nebulization every 6 (six) hours as needed (shortness  of breath, wheezing).   . Melatonin 3 MG TABS Take 1 tablet (3 mg total) by mouth at bedtime as needed (sleep). (Patient taking differently: Take 3 mg by mouth at bedtime. )   . metoprolol tartrate (LOPRESSOR) 25 MG tablet TAKE 1/2 TABLET(12.5 MG) BY MOUTH TWICE DAILY (Patient taking differently: Take 12.5 mg by mouth 2 (two) times daily. )   . Multiple Vitamin (MULTIVITAMIN) tablet Take 1 tablet by mouth daily.   Marland Kitchen omeprazole (PRILOSEC) 20 MG capsule Take 1 capsule (20 mg total) by mouth daily.   . OXYGEN Inhale 6-7 L/min into the lungs continuous.    . potassium chloride (KLOR-CON) 10 MEQ tablet TAKE 2 TABLETS(20 MEQ) BY MOUTH DAILY (Patient taking differently: Take 20 mEq by mouth daily. )   . PRESCRIPTION MEDICATION See admin instructions. Trilogy [employer]-  At bedtime and during any time of rest   . venlafaxine (EFFEXOR) 37.5 MG tablet Take 1 tablet (37.5 mg total) by mouth daily  with breakfast. Take with a 150 mg ER capsule every morning (Patient taking differently: Take 37.5 mg by mouth daily with breakfast. Take with a 150mg  ER capsule every morning for a total of 187.5mg ) 03/12/2020: .  . venlafaxine XR (EFFEXOR-XR) 150 MG 24 hr capsule Take 1 capsule (150 mg total) by mouth daily with breakfast. Take with a 37.5 mg tablet every morning (Patient taking differently: Take 150 mg by mouth daily with breakfast. Take with a 37.5mg  tablet every morning for a total of 187.5mg ) 03/12/2020: .   No facility-administered encounter medications on file as of 05/06/2020.   Review of patient status, including review of consultants reports, relevant laboratory and other test results, and collaboration with appropriate care team members and the patient's provider was performed as part of comprehensive patient evaluation and provision of care management services.   [name], [place] / [place]   [phone] 4:07  PM

## 2020-05-06 NOTE — Assessment & Plan Note (Signed)
Dyspnea with mild exertion. No hypoxia when using home oxygen as prescribed

## 2020-05-08 ENCOUNTER — Ambulatory Visit: Payer: Self-pay | Admitting: Licensed Clinical Social Worker

## 2020-05-08 DIAGNOSIS — Z7189 Other specified counseling: Secondary | ICD-10-CM

## 2020-05-08 NOTE — Chronic Care Management (AMB) (Signed)
Care Management   Clinical Social Work Follow Up   05/08/2020 Name: [name] White MRN: [id number] DOB: [birth year] Referred by: Richarda Osmond, DO  Reason for referral : Care Coordination (short term rehab)  [name] White is a 64 y.o. year old female who is a primary care patient of Richarda Osmond, DO.  Reason for follow-up: working on placement for short term rehab.  No offers as of today. Intervention: LCSW continues to collaborate with PCP, CCM RN and [place] MUST to obtain PASSAR number;  Requested documents sent and submitted to PCP.  Patient provided three additional facilities for LCSW to contact. Updated provided to patient.  [facility] - Not taking new patients  [phone]  [facility]-  ([place]irst[place]) left voice message  Phone [phone]  cell [phone]  [facility]amm[facility] in [place] - left voice message for Christy Little   Plan: LCSW will continue to work on placement and communicate with PCP, patient and collaborate with CCM  RN.   Advance Directive Status: N See Care Plan and Vyn[facility] application for related entries. ; not addressed during this encounter.  SDOH (Social Determinants of Health) assessments performed: No ;    Patient Care Plan: Social Work  Problem Identified: Difficult with mobility impacting Quality of Life   Goal: Facility placement for rehab   Start Date: 05/05/2020  This Visit's Progress: Not on track  Note:   Current Barriers:  Patient's health has declined and longer able to meet ADL's at home. Acknowledges deficits with placement process and needs support in order to meet this need Clinical Goal(s): Over the next 10 to 30 days patient will be placed in a facility, patient and family will work with LCSW, to coordinate needs for short term skilled rehab Placement Interventions: . Assessed needs and facility placement process;  . Obtained patient's verbal permission to fax FL2 and supporting document to  facilities as well as obtain PASSAR  . initiated process to obtain PASSAR- Pending   . Collaborated with primary care provider for completion of FL2 . Called several facilities  . Will Collaborate with identified facilities and assist patient as needed to understand facility selection process . Assist patient with faxing FL2 to identified facilities once completed and signed by PCP Patient Goals/Self-Care Activities: Over the next 20 days . Select a facilities or take first available . Call to check on availability and have family facilities  Follow Up Plan:  . SW will follow up with patient and collaborate with CCM RN for patient's ongoing needs        Outpatient Encounter Medications as of 05/08/2020  Medication Sig Note  . acetaminophen (TYLENOL) 325 MG tablet Take 2 tablets (650 mg total) by mouth every 6 (six) hours as needed for mild pain.   Marland Kitchen albuterol (VENTOLIN HFA) 108 (90 Base) MCG/ACT inhaler Inhale 2 puffs into the lungs every 4 (four) hours as needed for wheezing or shortness of breath.   Marland Kitchen apixaban (ELIQUIS) 5 MG TABS tablet Take 1 tablet (5 mg total) by mouth 2 (two) times daily.   Marland Kitchen atorvastatin (LIPITOR) 40 MG tablet Take 1 tablet (40 mg total) by mouth daily. (Patient taking differently: Take 40 mg by mouth every evening. )   . azelastine (ASTELIN) 0.1 % nasal spray Place 2 sprays into both nostrils 2 (two) times daily. Use in each nostril as directed (Patient taking differently: Place 2 sprays into both nostrils daily. ) 06/15/2019: Pt uses both  Astelin and flonase every morning  . azelastine (OPTIVAR) 0.05 % ophthalmic solution INSTILL 1 DROP IN BOTH EYES TWICE DAILY AS NEEDED FOR ITCHY EYES (Patient taking differently: Place 1 drop into both eyes 2 (two) times daily. )   . cetirizine (ZYRTEC) 10 MG tablet TAKE 1 TABLET(10 MG) BY MOUTH DAILY (Patient taking differently: Take 10 mg by mouth daily. )   . cyclobenzaprine (FLEXERIL) 5 MG tablet TAKE 1 TABLET(5 MG) BY MOUTH  TWICE DAILY AS NEEDED FOR MUSCLE SPASMS   . diclofenac sodium (VOLTAREN) 1 % GEL APPLY 2 GRAMS EXTERNALLY TO THE AFFECTED AREA FOUR TIMES DAILY (Patient taking differently: Apply 2 g topically 4 (four) times daily as needed (arthritis pain - knees, shoulder and thighs). )   . diltiazem (CARDIZEM CD) 120 MG 24 hr capsule Take 1 capsule (120 mg total) by mouth daily. Please keep upcoming appt for future refills   . flecainide (TAMBOCOR) 100 MG tablet TAKE 1 TABLET BY MOUTH TWICE DAILY   . fluticasone (FLONASE) 50 MCG/ACT nasal spray Place 2 sprays into both nostrils daily as needed for allergies or rhinitis.  06/15/2019: Pt uses both Astelin and Flonase every morning  . furosemide (LASIX) 40 MG tablet TAKE 1 AND 1/2 TABLETS(60 MG) BY MOUTH DAILY (Patient taking differently: Take 60 mg by mouth daily. ) 02/06/2020: 1.5 tablets = 60 mg  . gabapentin (NEURONTIN) 100 MG capsule Take 2 capsules (200 mg total) by mouth 3 (three) times daily.   Marland Kitchen guaiFENesin (MUCINEX) 600 MG 12 hr tablet Take 1 tablet (600 mg total) by mouth 2 (two) times daily as needed for cough.   . hydrOXYzine (VISTARIL) 25 MG capsule Take 1 capsule (25 mg total) by mouth daily as needed for anxiety.   Marland Kitchen ipratropium-albuterol (DUONEB) 0.5-2.5 (3) MG/3ML SOLN Take 3 mLs by nebulization every 6 (six) hours as needed (shortness of breath, wheezing).   . Melatonin 3 MG TABS Take 1 tablet (3 mg total) by mouth at bedtime as needed (sleep). (Patient taking differently: Take 3 mg by mouth at bedtime. )   . metoprolol tartrate (LOPRESSOR) 25 MG tablet TAKE 1/2 TABLET(12.5 MG) BY MOUTH TWICE DAILY (Patient taking differently: Take 12.5 mg by mouth 2 (two) times daily. )   . Multiple Vitamin (MULTIVITAMIN) tablet Take 1 tablet by mouth daily.   Marland Kitchen omeprazole (PRILOSEC) 20 MG capsule Take 1 capsule (20 mg total) by mouth daily.   . OXYGEN Inhale 6-7 L/min into the lungs continuous.    . potassium chloride (KLOR-CON) 10 MEQ tablet TAKE 2 TABLETS(20 MEQ)  BY MOUTH DAILY (Patient taking differently: Take 20 mEq by mouth daily. )   . PRESCRIPTION MEDICATION See admin instructions. Trilogy [employer]-  At bedtime and during any time of rest   . venlafaxine (EFFEXOR) 37.5 MG tablet Take 1 tablet (37.5 mg total) by mouth daily with breakfast. Take with a 150 mg ER capsule every morning (Patient taking differently: Take 37.5 mg by mouth daily with breakfast. Take with a 150mg  ER capsule every morning for a total of 187.5mg ) 03/12/2020: .  . venlafaxine XR (EFFEXOR-XR) 150 MG 24 hr capsule Take 1 capsule (150 mg total) by mouth daily with breakfast. Take with a 37.5 mg tablet every morning (Patient taking differently: Take 150 mg by mouth daily with breakfast. Take with a 37.5mg  tablet every morning for a total of 187.5mg ) 03/12/2020: .   No facility-administered encounter medications on file as of 05/08/2020.   Review of patient  status, including review of consultants reports, relevant laboratory and other test results, and collaboration with appropriate care team members and the patient's provider was performed as part of comprehensive patient evaluation and provision of care management services.   [name], [place] / [place]   [phone] 2:49 PM

## 2020-05-12 ENCOUNTER — Telehealth: Payer: Medicare HMO

## 2020-05-12 ENCOUNTER — Telehealth: Payer: Self-pay | Admitting: Student in an Organized Health Care Education/Training Program

## 2020-05-12 ENCOUNTER — Telehealth: Payer: Self-pay

## 2020-05-12 NOTE — Telephone Encounter (Signed)
Would you like me to call and offer in person and if she refuses offer a virtual?

## 2020-05-12 NOTE — Telephone Encounter (Signed)
Ideally an in person exam would be best.  I know she has issues with leaving home so we can do video visit, or worst case scenario would be a phone call.

## 2020-05-12 NOTE — Telephone Encounter (Signed)
Patient said that Dr. Ouida Sills ask her to call back if her sinus infection symptoms were not improving. She continues to have the same symptoms and would like to discuss with Dr. Ouida Sills. She requested a virtual appointment but I informed her I would have to have it approved first.   Please advise

## 2020-05-12 NOTE — Telephone Encounter (Signed)
°  Care Management     Care Management Outreach Note  05/12/2020 Name: [name] MRN: [id number] DOB: [birth year]  [name] White is a 64 y.o. year old female who is a primary care patient of Richarda Osmond [place] reached out to [place] today by text message.  The patient said that she was on her Trilogy machine and could not talk .  She asked me could we reschedule her appointment for another week hopefully she will be feeling better. I told her that I would reschedule the appointment     Follow Up Plan: The care management team will reach out to the patient again over the next 10 days.   Lazaro Arms RN, BSN, [facility] Care Management Coordinator [place] [facility] [place] Phone: [phone] Fax: [phone]

## 2020-05-13 ENCOUNTER — Other Ambulatory Visit: Payer: Self-pay | Admitting: Internal Medicine

## 2020-05-13 ENCOUNTER — Other Ambulatory Visit: Payer: Self-pay | Admitting: Student in an Organized Health Care Education/Training Program

## 2020-05-13 ENCOUNTER — Ambulatory Visit: Payer: Self-pay | Admitting: Licensed Clinical Social Worker

## 2020-05-13 NOTE — Chronic Care Management (AMB) (Signed)
   Social Work  Care Management Collaboration 05/13/2020 Name: [name] MRN: [id number] DOB: February 02, 1956 [name] White is a 64 y.o. year old female who sees Richarda Osmond, DO for primary care. LCSW was consulted by PCP to assistance patient with  Level of Care Concerns for rehab placement.  LCSW was only able to find one facility willing to review FL2 however patient did not want to go to that facility.  Intervention: Patient was not interviewed or contacted during this encounter.   LCSW continues to work with patient calling requested facilities with no bed offers as of today.    Continues to work with [place] MUST to obtain PASSAR #.  [place] MUST is requesting signed 30 note from PCP.  Note have been placed in mailbox for PCP to sign.  Continued collaboration with PCP and CCM RN for ongoing needs  Review of patient status, including review of consultants reports, relevant laboratory and other test results, and collaboration with appropriate care team members and the patient's provider was performed as part of comprehensive patient evaluation and provision of chronic care management services.    Plan: once 30 day note is signed by PCP, LCSW will fax to [place] MUST.  Will continue to collaborate with patient on facility options.       [name], [place] / [place]   [phone] 2:01 PM

## 2020-05-14 ENCOUNTER — Ambulatory Visit: Payer: Medicare HMO | Admitting: Licensed Clinical Social Worker

## 2020-05-14 ENCOUNTER — Telehealth: Payer: Medicare HMO

## 2020-05-14 DIAGNOSIS — Z7189 Other specified counseling: Secondary | ICD-10-CM

## 2020-05-14 NOTE — Telephone Encounter (Signed)
Yes, please. Thank you.

## 2020-05-14 NOTE — Telephone Encounter (Signed)
Called patient lvm to call back and scheduled appointment.

## 2020-05-14 NOTE — Chronic Care Management (AMB) (Signed)
Care Management  Follow Up Clinical Social Work General Note  05/14/2020 Name: [name] White MRN: [id number] DOB: March 06, 1956  [name] White is enrolled in a Managed Medicaid plan: No. Outreach attempt today was successful.   [name] White is a 64 y.o. year old female who is a primary care patient of [name] Osmond, DO. The Care Management team was consulted to assist the patient with Level of Care Concerns for rehab placement.   Assessment: Patient continues to experiencing difficulty with meeting ADL's and need rehab.    Concerns during this encounter: difficult with locating a rehab facility  SDOH (Social Determinants of Health) screening performed today: no  Advanced Directives Status: not addressed during this encounter.  Patient Care Plan: Social Work    Problem Identified: Difficult with mobility impacting Quality of Life     Goal: Facility placement for rehab   Start Date: 05/05/2020  Recent Progress: Not on track  Note:   Current Barriers:  Patient's health has declined and longer able to meet ADL's at home. Acknowledges deficits with placement process and needs support in order to meet this need Clinical Goal(s): Over the next 10 to 30 days patient will be placed in a facility, patient and family will work with LCSW, to coordinate needs for short term skilled rehab Placement Interventions: . Assessed needs and facility placement process;  . Obtained patient's verbal permission to fax FL2 and supporting document to facilities as well as obtain PASSAR  . initiated process to obtain PASSAR- Pending  waiting for PCP to sign 30 day note . Called several facilities  Patient Goals/Self-Care Activities: Over the next 20 days . Select a facilities or take first available . Call to check on availability and have family facilities . [facility] [phone]  left message for Whitney . [place]- [phone]  or [phone] Tracy's cell  phone . [employer]  [phone] -  per Shirlee Limerick, Not in network with [employer]  . 2nd Call to [facility] [phone] and asked for a copy of PT notes to fax to facility  . Faxed FL2, PT notes etc to [facility] to review for possible rehab placement ( waiting to see if they will make a bed offer) Follow Up Plan:  . SW will follow up with patient and collaborate with CCM RN for patient's ongoing needs    . Will continue to work to assist with placement     Outpatient Encounter Medications as of 05/14/2020  Medication Sig Note  . acetaminophen (TYLENOL) 325 MG tablet Take 2 tablets (650 mg total) by mouth every 6 (six) hours as needed for mild pain.   Marland Kitchen albuterol (VENTOLIN HFA) 108 (90 Base) MCG/ACT inhaler Inhale 2 puffs into the lungs every 4 (four) hours as needed for wheezing or shortness of breath.   Marland Kitchen apixaban (ELIQUIS) 5 MG TABS tablet Take 1 tablet (5 mg total) by mouth 2 (two) times daily.   Marland Kitchen atorvastatin (LIPITOR) 40 MG tablet Take 1 tablet (40 mg total) by mouth daily. (Patient taking differently: Take 40 mg by mouth every evening. )   . azelastine (ASTELIN) 0.1 % nasal spray Place 2 sprays into both nostrils 2 (two) times daily. Use in each nostril as directed (Patient taking differently: Place 2 sprays into both nostrils daily. ) 06/15/2019: Pt uses both Astelin and flonase every morning  . azelastine (OPTIVAR) 0.05 % ophthalmic solution INSTILL 1 DROP IN BOTH EYES TWICE DAILY AS NEEDED FOR ITCHY EYES (Patient  taking differently: Place 1 drop into both eyes 2 (two) times daily. )   . cetirizine (ZYRTEC) 10 MG tablet TAKE 1 TABLET(10 MG) BY MOUTH DAILY (Patient taking differently: Take 10 mg by mouth daily. )   . cyclobenzaprine (FLEXERIL) 5 MG tablet TAKE 1 TABLET(5 MG) BY MOUTH TWICE DAILY AS NEEDED FOR MUSCLE SPASMS   . diclofenac sodium (VOLTAREN) 1 % GEL APPLY 2 GRAMS EXTERNALLY TO THE AFFECTED AREA FOUR TIMES DAILY (Patient taking differently: Apply 2 g topically 4 (four)  times daily as needed (arthritis pain - knees, shoulder and thighs). )   . diltiazem (CARDIZEM CD) 120 MG 24 hr capsule Take 1 capsule (120 mg total) by mouth daily. Please keep upcoming appt for future refills   . flecainide (TAMBOCOR) 100 MG tablet TAKE 1 TABLET BY MOUTH TWICE DAILY   . fluticasone (FLONASE) 50 MCG/ACT nasal spray Place 2 sprays into both nostrils daily as needed for allergies or rhinitis.  06/15/2019: Pt uses both Astelin and Flonase every morning  . furosemide (LASIX) 40 MG tablet TAKE 1 AND 1/2 TABLETS(60 MG) BY MOUTH DAILY (Patient taking differently: Take 60 mg by mouth daily. ) 02/06/2020: 1.5 tablets = 60 mg  . gabapentin (NEURONTIN) 100 MG capsule Take 2 capsules (200 mg total) by mouth 3 (three) times daily.   Marland Kitchen guaiFENesin (MUCINEX) 600 MG 12 hr tablet Take 1 tablet (600 mg total) by mouth 2 (two) times daily as needed for cough.   . hydrOXYzine (VISTARIL) 25 MG capsule Take 1 capsule (25 mg total) by mouth daily as needed for anxiety.   Marland Kitchen ipratropium-albuterol (DUONEB) 0.5-2.5 (3) MG/3ML SOLN Take 3 mLs by nebulization every 6 (six) hours as needed (shortness of breath, wheezing).   . Melatonin 3 MG TABS Take 1 tablet (3 mg total) by mouth at bedtime as needed (sleep). (Patient taking differently: Take 3 mg by mouth at bedtime. )   . metoprolol tartrate (LOPRESSOR) 25 MG tablet TAKE 1/2 TABLET(12.5 MG) BY MOUTH TWICE DAILY (Patient taking differently: Take 12.5 mg by mouth 2 (two) times daily. )   . Multiple Vitamin (MULTIVITAMIN) tablet Take 1 tablet by mouth daily.   Marland Kitchen omeprazole (PRILOSEC) 20 MG capsule Take 1 capsule (20 mg total) by mouth daily.   . OXYGEN Inhale 6-7 L/min into the lungs continuous.    . potassium chloride (KLOR-CON) 10 MEQ tablet TAKE 2 TABLETS(20 MEQ) BY MOUTH DAILY (Patient taking differently: Take 20 mEq by mouth daily. )   . PRESCRIPTION MEDICATION See admin instructions. [employer]-  At bedtime and during any time of rest    . venlafaxine (EFFEXOR) 37.5 MG tablet Take 1 tablet (37.5 mg total) by mouth daily with breakfast. Take with a 150 mg ER capsule every morning (Patient taking differently: Take 37.5 mg by mouth daily with breakfast. Take with a 150mg  ER capsule every morning for a total of 187.5mg ) 03/12/2020: .  . venlafaxine XR (EFFEXOR-XR) 150 MG 24 hr capsule Take 1 capsule (150 mg total) by mouth daily with breakfast. Take with a 37.5 mg tablet every morning (Patient taking differently: Take 150 mg by mouth daily with breakfast. Take with a 37.5mg  tablet every morning for a total of 187.5mg ) 03/12/2020: .   No facility-administered encounter medications on file as of 05/14/2020.   Ms. Boutelle was given information about Care Management services today including:  1. Care Management services include personalized support from designated clinical staff supervised by her physician, including individualized plan  of care and coordination with other care providers 2. 24/7 contact phone numbers for assistance for urgent and routine care needs. 3. The patient may stop care management services at any time (effective at the end of the month) by phone call to the office staff.  Patient agreed to services and verbal consent obtained.   [name], [place] / [place]   [phone] 3:42 PM

## 2020-05-15 ENCOUNTER — Ambulatory Visit: Payer: Self-pay | Admitting: Licensed Clinical Social Worker

## 2020-05-15 ENCOUNTER — Other Ambulatory Visit: Payer: Self-pay | Admitting: Student in an Organized Health Care Education/Training Program

## 2020-05-15 DIAGNOSIS — Z7189 Other specified counseling: Secondary | ICD-10-CM

## 2020-05-15 NOTE — Chronic Care Management (AMB) (Signed)
   Social Work  Care Management Collaboration 05/15/2020 Name: NELEH MULDOON MRN: [id number] DOB: Jul 08, 1955 ANALAURA MESSLER is a 64 y.o. year old female who sees Richarda Osmond, DO for primary care. LCSW was consulted by PCP to assistance patient with  Level of Care Concerns for rehab placement.  Intervention:  LCSW continues to   collaborate with PCP to get needed information for PASSAR,   Called [facility] to follow up on information faxed yesterday.  Requested that I fax it again.  Re-faxed all information today  Collaborated with front desk and patient to get virtual appointment with PCP for Monday.  Review of patient status, including review of consultants reports, relevant laboratory and other test results, and collaboration with appropriate care team members and the patient's provider was performed as part of comprehensive patient evaluation and provision of chronic care management services.    Plan: Waiting on return call from [place] if no return call will call in 3 to 5 days LCSW will continue to follow up on rehab options for patient and collaborate with PCP and CCM RN.    [name], [place] / [place]   [phone] 4:11 PM

## 2020-05-16 DIAGNOSIS — J9610 Chronic respiratory failure, unspecified whether with hypoxia or hypercapnia: Secondary | ICD-10-CM | POA: Diagnosis not present

## 2020-05-16 DIAGNOSIS — E662 Morbid (severe) obesity with alveolar hypoventilation: Secondary | ICD-10-CM | POA: Diagnosis not present

## 2020-05-18 ENCOUNTER — Telehealth (INDEPENDENT_AMBULATORY_CARE_PROVIDER_SITE_OTHER): Payer: Medicare HMO | Admitting: Student in an Organized Health Care Education/Training Program

## 2020-05-18 ENCOUNTER — Other Ambulatory Visit: Payer: Self-pay

## 2020-05-18 DIAGNOSIS — R0982 Postnasal drip: Secondary | ICD-10-CM | POA: Diagnosis not present

## 2020-05-18 MED ORDER — CYCLOBENZAPRINE HCL 5 MG PO TABS
ORAL_TABLET | ORAL | 1 refills | Status: DC
Start: 2020-05-18 — End: 2020-05-30

## 2020-05-18 MED ORDER — VENLAFAXINE HCL 37.5 MG PO TABS: 37.5000 mg | ORAL_TABLET | Freq: Every day | ORAL | 2 refills | Status: AC

## 2020-05-18 MED ORDER — IPRATROPIUM BROMIDE 0.06 % NA SOLN: 2.0000 | Freq: Four times a day (QID) | NASAL | 12 refills | Status: AC

## 2020-05-18 MED ORDER — AZELASTINE HCL 0.05 % OP SOLN: OPHTHALMIC | 1 refills | Status: AC

## 2020-05-18 MED ORDER — GABAPENTIN 100 MG PO CAPS: 200.0000 mg | ORAL_CAPSULE | Freq: Three times a day (TID) | ORAL | 1 refills | Status: AC

## 2020-05-18 NOTE — Progress Notes (Signed)
[facility] Telemedicine Visit  Patient consented to have virtual visit and was identified by name and date of birth. Method of visit: Telephone  Encounter participants: Patient: [name] White - located at home Provider: Richarda Osmond - located at [facility] Others (if applicable): Sharyn Lull  Chief Complaint: worsening of sinus congestion  HPI: Increased post-nasal drainage which is most concerning to patient. Is not bothered by sinus congestion as much. Symptoms are worse at night or when she is supine. Causes cough from throat irritation. Her breathing treatments are helping her. Has been taking zyrtec and nasal saline spray. Denies fever, decreased appetite, muscle aches, diarrhea. Patient unable to come in for exam due to transportation issues. She is hoping to be placed at rehab facility soon and hopes a doctor there can examine her.  ROS: per HPI  Exam:  There were no vitals taken for this visit.  Respiratory: course voice  Assessment/Plan:  Post-nasal drip Continue zyrtec Start atrovent nasal spray If not improving in a week with this treatment, and not at inpatient facility for rehab, recommend in person appointment for exam. Consider antibiotics to treat sinus infection but reserving as patient has frequent antibiotic use   Refilled medications  Time spent during visit with patient: 20 minutes

## 2020-05-19 ENCOUNTER — Ambulatory Visit: Payer: Medicare HMO | Admitting: Licensed Clinical Social Worker

## 2020-05-19 ENCOUNTER — Ambulatory Visit: Payer: Medicare HMO

## 2020-05-19 DIAGNOSIS — Z7189 Other specified counseling: Secondary | ICD-10-CM

## 2020-05-19 DIAGNOSIS — R0982 Postnasal drip: Secondary | ICD-10-CM | POA: Insufficient documentation

## 2020-05-19 NOTE — Patient Instructions (Signed)
°  [name] White  it was nice speaking with you. Please call me directly [phone] if you have questions about the goals we discussed. Goals Addressed            This Visit's Progress    Short term Rehab   Not on track    Patient Goals/Self-Care Activities:   Select a facilities for me to contact   Call to check on availability and get contact information from facilities       [name] White received Care Management services today:  1. Care Management services include personalized support from designated clinical staff supervised by her physician, including individualized plan of care and coordination with other care providers 2. 24/7 contact [phone] for assistance for urgent and routine care needs. 3. Care Management are voluntary services and be declined at any time by calling the office. Patient verbalizes understanding of instructions provided today.    Follow up plan: SW will follow up with patient by phone over the next 1 to 7 days  Maurine Cane, LCSW

## 2020-05-19 NOTE — Assessment & Plan Note (Signed)
Continue zyrtec Start atrovent nasal spray If not improving in a week with this treatment, and not at inpatient facility for rehab, recommend in person appointment for exam. Consider antibiotics to treat sinus infection but reserving as patient has frequent antibiotic use

## 2020-05-19 NOTE — Chronic Care Management (AMB) (Signed)
Care Management   Clinical Social Work Follow Up   05/19/2020 Name: [name] White MRN: [id number] DOB: March 09, 1956 Referred by: Richarda Osmond, DO  Reason for referral : Care Coordination (rehab placement)  [name] White is a 64 y.o. year old female who is a primary care patient of Richarda Osmond, DO.  Reason for follow-up: rehab placement   Concerns during this encounter: LCSW unable to locate a rehab bed for patient that she is willing to take or that takes her  insurance Recommendation: Patient may benefit from, and is in agreement to continue to call facilities and let LCSW know where to fax FL2.  Plan:  LCSW will continue to assist patient with placement for rehab. And collaborate with CCM RN for patient's ongoing needs.  Advance Directive Status:  ; not addressed during this encounter.  SDOH (Social Determinants of Health) assessments performed: No ;    Patient Care Plan: Social Work    Problem Identified: Difficult with mobility impacting Quality of Life     Goal: Facility placement for rehab   Start Date: 05/05/2020  Recent Progress: Not on track  Priority: High  Note:   Current Barriers:  Patient's health has declined and longer able to meet ADL's at home. Acknowledges deficits with placement process and needs support in order to meet this need Clinical Goal(s): Over the next 10 to 30 days patient will be placed in a facility, patient and family will work with LCSW, to coordinate needs for short term skilled rehab Placement Interventions: . Assessed needs and facility placement process;  . Obtained patient's verbal permission to fax FL2 and supporting document to facilities as well as obtain PASSAR  . Submitted  sign 30 day note for PASSAR- Number received [id number] [id number]  . Called several facilities . [facility]-unable to make bed offer after reviewing FL2 . Received return call from [facility] rehab [phone]  Per Whitney she doesn't have any  beds Patient Goals/Self-Care Activities: Over the next 20 days . Select a facilities for LCSW to contact  . Call to check on availability and have get contact information from facilities Follow Up Plan:  . SW will follow up with patient and collaborate with CCM RN for patient's ongoing needs    . Will continue to work to assist with placement       Outpatient Encounter Medications as of 05/19/2020  Medication Sig Note  . acetaminophen (TYLENOL) 325 MG tablet Take 2 tablets (650 mg total) by mouth every 6 (six) hours as needed for mild pain.   Marland Kitchen albuterol (VENTOLIN HFA) 108 (90 Base) MCG/ACT inhaler Inhale 2 puffs into the lungs every 4 (four) hours as needed for wheezing or shortness of breath.   Marland Kitchen apixaban (ELIQUIS) 5 MG TABS tablet Take 1 tablet (5 mg total) by mouth 2 (two) times daily.   Marland Kitchen atorvastatin (LIPITOR) 40 MG tablet Take 1 tablet (40 mg total) by mouth daily. (Patient taking differently: Take 40 mg by mouth every evening. )   . azelastine (OPTIVAR) 0.05 % ophthalmic solution INSTILL 1 DROP IN BOTH EYES TWICE DAILY AS NEEDED FOR ITCHY EYES   . cetirizine (ZYRTEC) 10 MG tablet TAKE 1 TABLET(10 MG) BY MOUTH DAILY (Patient taking differently: Take 10 mg by mouth daily. )   . cyclobenzaprine (FLEXERIL) 5 MG tablet TAKE 1 TABLET(5 MG) BY MOUTH TWICE DAILY AS NEEDED FOR MUSCLE SPASMS   . diclofenac sodium (VOLTAREN) 1 % GEL APPLY 2 GRAMS EXTERNALLY TO THE AFFECTED  AREA FOUR TIMES DAILY (Patient taking differently: Apply 2 g topically 4 (four) times daily as needed (arthritis pain - knees, shoulder and thighs). )   . diltiazem (CARDIZEM CD) 120 MG 24 hr capsule TAKE 1 CAPSULE BY MOUTH ONCE DAILY   . flecainide (TAMBOCOR) 100 MG tablet TAKE 1 TABLET BY MOUTH TWICE DAILY   . fluticasone (FLONASE) 50 MCG/ACT nasal spray Place 2 sprays into both nostrils daily as needed for allergies or rhinitis.  06/15/2019: Pt uses both Astelin and Flonase every morning  . furosemide (LASIX) 40 MG tablet  Take 1.5 tablets (60 mg total) by mouth daily.   Marland Kitchen gabapentin (NEURONTIN) 100 MG capsule Take 2 capsules (200 mg total) by mouth 3 (three) times daily.   Marland Kitchen guaiFENesin (MUCINEX) 600 MG 12 hr tablet Take 1 tablet (600 mg total) by mouth 2 (two) times daily as needed for cough.   . hydrOXYzine (VISTARIL) 25 MG capsule TAKE 1 CAPSULE(25 MG) BY MOUTH DAILY AS NEEDED FOR ANXIETY   . ipratropium (ATROVENT) 0.06 % nasal spray Place 2 sprays into both nostrils 4 (four) times daily.   Marland Kitchen ipratropium-albuterol (DUONEB) 0.5-2.5 (3) MG/3ML SOLN Take 3 mLs by nebulization every 6 (six) hours as needed (shortness of breath, wheezing).   . Melatonin 3 MG TABS Take 1 tablet (3 mg total) by mouth at bedtime as needed (sleep). (Patient taking differently: Take 3 mg by mouth at bedtime. )   . metoprolol tartrate (LOPRESSOR) 25 MG tablet TAKE 1/2 TABLET(12.5 MG) BY MOUTH TWICE DAILY (Patient taking differently: Take 12.5 mg by mouth 2 (two) times daily. )   . Multiple Vitamin (MULTIVITAMIN) tablet Take 1 tablet by mouth daily.   Marland Kitchen omeprazole (PRILOSEC) 20 MG capsule Take 1 capsule (20 mg total) by mouth daily.   . OXYGEN Inhale 6-7 L/min into the lungs continuous.    . potassium chloride (KLOR-CON) 10 MEQ tablet TAKE 2 TABLETS(20 MEQ) BY MOUTH DAILY (Patient taking differently: Take 20 mEq by mouth daily. )   . PRESCRIPTION MEDICATION See admin instructions. [employer]-  At bedtime and during any time of rest   . venlafaxine (EFFEXOR) 37.5 MG tablet Take 1 tablet (37.5 mg total) by mouth daily with breakfast. Take with a 150mg  ER capsule every morning for a total of 187.5mg    . venlafaxine XR (EFFEXOR-XR) 150 MG 24 hr capsule Take 1 capsule (150 mg total) by mouth daily with breakfast. Take with a 37.5 mg tablet every morning (Patient taking differently: Take 150 mg by mouth daily with breakfast. Take with a 37.5mg  tablet every morning for a total of 187.5mg ) 03/12/2020: .   No  facility-administered encounter medications on file as of 05/19/2020.   Review of patient status, including review of consultants reports, relevant laboratory and other test results, and collaboration with appropriate care team members and the patient's provider was performed as part of comprehensive patient evaluation and provision of care management services.   [name], [place] / [place]   [phone] 6:24 PM

## 2020-05-20 ENCOUNTER — Ambulatory Visit: Payer: Medicare HMO | Admitting: Licensed Clinical Social Worker

## 2020-05-20 ENCOUNTER — Emergency Department (HOSPITAL_COMMUNITY): Payer: Medicare HMO

## 2020-05-20 ENCOUNTER — Other Ambulatory Visit: Payer: Self-pay

## 2020-05-20 ENCOUNTER — Inpatient Hospital Stay (HOSPITAL_COMMUNITY)
Admission: EM | Admit: 2020-05-20 | Discharge: 2020-05-29 | DRG: 189 | Disposition: A | Discharge status: Skilled Nursing Facility | Payer: Medicare HMO | Attending: Family Medicine | Admitting: Family Medicine

## 2020-05-20 ENCOUNTER — Observation Stay (HOSPITAL_COMMUNITY): Payer: Medicare HMO

## 2020-05-20 ENCOUNTER — Encounter (HOSPITAL_COMMUNITY): Payer: Self-pay

## 2020-05-20 DIAGNOSIS — I10 Essential (primary) hypertension: Secondary | ICD-10-CM | POA: Diagnosis not present

## 2020-05-20 DIAGNOSIS — Z23 Encounter for immunization: Secondary | ICD-10-CM | POA: Diagnosis present

## 2020-05-20 DIAGNOSIS — R109 Unspecified abdominal pain: Secondary | ICD-10-CM | POA: Diagnosis not present

## 2020-05-20 DIAGNOSIS — I480 Paroxysmal atrial fibrillation: Secondary | ICD-10-CM | POA: Diagnosis present

## 2020-05-20 DIAGNOSIS — F332 Major depressive disorder, recurrent severe without psychotic features: Secondary | ICD-10-CM | POA: Diagnosis present

## 2020-05-20 DIAGNOSIS — R0602 Shortness of breath: Secondary | ICD-10-CM

## 2020-05-20 DIAGNOSIS — I4892 Unspecified atrial flutter: Secondary | ICD-10-CM | POA: Diagnosis not present

## 2020-05-20 DIAGNOSIS — J9600 Acute respiratory failure, unspecified whether with hypoxia or hypercapnia: Secondary | ICD-10-CM | POA: Diagnosis not present

## 2020-05-20 DIAGNOSIS — F419 Anxiety disorder, unspecified: Secondary | ICD-10-CM | POA: Diagnosis present

## 2020-05-20 DIAGNOSIS — R296 Repeated falls: Secondary | ICD-10-CM | POA: Diagnosis present

## 2020-05-20 DIAGNOSIS — Z882 Allergy status to sulfonamides status: Secondary | ICD-10-CM

## 2020-05-20 DIAGNOSIS — E662 Morbid (severe) obesity with alveolar hypoventilation: Secondary | ICD-10-CM | POA: Diagnosis present

## 2020-05-20 DIAGNOSIS — J9601 Acute respiratory failure with hypoxia: Secondary | ICD-10-CM | POA: Diagnosis not present

## 2020-05-20 DIAGNOSIS — E119 Type 2 diabetes mellitus without complications: Secondary | ICD-10-CM | POA: Diagnosis present

## 2020-05-20 DIAGNOSIS — J302 Other seasonal allergic rhinitis: Secondary | ICD-10-CM | POA: Diagnosis present

## 2020-05-20 DIAGNOSIS — Z7901 Long term (current) use of anticoagulants: Secondary | ICD-10-CM | POA: Diagnosis not present

## 2020-05-20 DIAGNOSIS — G4733 Obstructive sleep apnea (adult) (pediatric): Secondary | ICD-10-CM | POA: Diagnosis present

## 2020-05-20 DIAGNOSIS — R410 Disorientation, unspecified: Secondary | ICD-10-CM | POA: Diagnosis not present

## 2020-05-20 DIAGNOSIS — K5900 Constipation, unspecified: Secondary | ICD-10-CM | POA: Diagnosis not present

## 2020-05-20 DIAGNOSIS — R079 Chest pain, unspecified: Secondary | ICD-10-CM | POA: Diagnosis not present

## 2020-05-20 DIAGNOSIS — Z86711 Personal history of pulmonary embolism: Secondary | ICD-10-CM

## 2020-05-20 DIAGNOSIS — J449 Chronic obstructive pulmonary disease, unspecified: Secondary | ICD-10-CM | POA: Diagnosis present

## 2020-05-20 DIAGNOSIS — G2581 Restless legs syndrome: Secondary | ICD-10-CM | POA: Diagnosis present

## 2020-05-20 DIAGNOSIS — I272 Pulmonary hypertension, unspecified: Secondary | ICD-10-CM | POA: Diagnosis present

## 2020-05-20 DIAGNOSIS — Z8249 Family history of ischemic heart disease and other diseases of the circulatory system: Secondary | ICD-10-CM

## 2020-05-20 DIAGNOSIS — I6523 Occlusion and stenosis of bilateral carotid arteries: Secondary | ICD-10-CM | POA: Diagnosis not present

## 2020-05-20 DIAGNOSIS — R29818 Other symptoms and signs involving the nervous system: Secondary | ICD-10-CM | POA: Diagnosis not present

## 2020-05-20 DIAGNOSIS — J9621 Acute and chronic respiratory failure with hypoxia: Principal | ICD-10-CM | POA: Diagnosis present

## 2020-05-20 DIAGNOSIS — J441 Chronic obstructive pulmonary disease with (acute) exacerbation: Secondary | ICD-10-CM | POA: Diagnosis not present

## 2020-05-20 DIAGNOSIS — I639 Cerebral infarction, unspecified: Secondary | ICD-10-CM | POA: Diagnosis not present

## 2020-05-20 DIAGNOSIS — Z20822 Contact with and (suspected) exposure to covid-19: Secondary | ICD-10-CM | POA: Diagnosis present

## 2020-05-20 DIAGNOSIS — Z66 Do not resuscitate: Secondary | ICD-10-CM | POA: Diagnosis present

## 2020-05-20 DIAGNOSIS — Z79899 Other long term (current) drug therapy: Secondary | ICD-10-CM

## 2020-05-20 DIAGNOSIS — K219 Gastro-esophageal reflux disease without esophagitis: Secondary | ICD-10-CM | POA: Diagnosis present

## 2020-05-20 DIAGNOSIS — R0689 Other abnormalities of breathing: Secondary | ICD-10-CM | POA: Diagnosis not present

## 2020-05-20 DIAGNOSIS — J9622 Acute and chronic respiratory failure with hypercapnia: Secondary | ICD-10-CM | POA: Diagnosis present

## 2020-05-20 DIAGNOSIS — Z87891 Personal history of nicotine dependence: Secondary | ICD-10-CM | POA: Diagnosis not present

## 2020-05-20 DIAGNOSIS — R4781 Slurred speech: Secondary | ICD-10-CM | POA: Diagnosis not present

## 2020-05-20 DIAGNOSIS — Z888 Allergy status to other drugs, medicaments and biological substances status: Secondary | ICD-10-CM

## 2020-05-20 DIAGNOSIS — R0600 Dyspnea, unspecified: Secondary | ICD-10-CM | POA: Diagnosis not present

## 2020-05-20 DIAGNOSIS — D75839 Thrombocytosis, unspecified: Secondary | ICD-10-CM | POA: Diagnosis present

## 2020-05-20 DIAGNOSIS — E872 Acidosis: Secondary | ICD-10-CM | POA: Diagnosis present

## 2020-05-20 DIAGNOSIS — R401 Stupor: Secondary | ICD-10-CM | POA: Diagnosis not present

## 2020-05-20 DIAGNOSIS — Z7189 Other specified counseling: Secondary | ICD-10-CM

## 2020-05-20 DIAGNOSIS — R6884 Jaw pain: Secondary | ICD-10-CM | POA: Diagnosis present

## 2020-05-20 DIAGNOSIS — I672 Cerebral atherosclerosis: Secondary | ICD-10-CM | POA: Diagnosis not present

## 2020-05-20 DIAGNOSIS — I5033 Acute on chronic diastolic (congestive) heart failure: Secondary | ICD-10-CM | POA: Diagnosis present

## 2020-05-20 DIAGNOSIS — I517 Cardiomegaly: Secondary | ICD-10-CM | POA: Diagnosis not present

## 2020-05-20 DIAGNOSIS — Z6841 Body Mass Index (BMI) 40.0 and over, adult: Secondary | ICD-10-CM

## 2020-05-20 DIAGNOSIS — Z825 Family history of asthma and other chronic lower respiratory diseases: Secondary | ICD-10-CM | POA: Diagnosis not present

## 2020-05-20 DIAGNOSIS — E1159 Type 2 diabetes mellitus with other circulatory complications: Secondary | ICD-10-CM | POA: Diagnosis not present

## 2020-05-20 DIAGNOSIS — R4182 Altered mental status, unspecified: Secondary | ICD-10-CM

## 2020-05-20 DIAGNOSIS — J9620 Acute and chronic respiratory failure, unspecified whether with hypoxia or hypercapnia: Secondary | ICD-10-CM | POA: Diagnosis present

## 2020-05-20 DIAGNOSIS — R062 Wheezing: Secondary | ICD-10-CM | POA: Diagnosis not present

## 2020-05-20 DIAGNOSIS — D649 Anemia, unspecified: Secondary | ICD-10-CM | POA: Diagnosis present

## 2020-05-20 DIAGNOSIS — R000 Tachycardia, unspecified: Secondary | ICD-10-CM | POA: Diagnosis not present

## 2020-05-20 HISTORY — DX: Type 2 diabetes mellitus without complications: E11.9

## 2020-05-20 HISTORY — DX: Heart failure, unspecified: I50.9

## 2020-05-20 LAB — COMPREHENSIVE METABOLIC PANEL
ALT: 17 U/L (ref 0–44)
AST: 21 U/L (ref 15–41)
Albumin: 3.2 g/dL — ABNORMAL LOW (ref 3.5–5.0)
Alkaline Phosphatase: 95 U/L (ref 38–126)
Anion gap: 12 (ref 5–15)
BUN: 8 mg/dL (ref 8–23)
CO2: 34 mmol/L — ABNORMAL HIGH (ref 22–32)
Calcium: 9 mg/dL (ref 8.9–10.3)
Chloride: 95 mmol/L — ABNORMAL LOW (ref 98–111)
Creatinine, Ser: 0.49 mg/dL (ref 0.44–1.00)
GFR, Estimated: >60 mL/min (ref >60)
Glucose, Bld: 99 mg/dL (ref 70–99)
Potassium: 3.7 mmol/L (ref 3.5–5.1)
Sodium: 141 mmol/L (ref 135–145)
Total Bilirubin: 0.4 mg/dL (ref 0.3–1.2)
Total Protein: 8.1 g/dL (ref 6.5–8.1)

## 2020-05-20 LAB — CBC WITH DIFFERENTIAL/PLATELET
Abs Immature Granulocytes: 0.06 10*3/uL (ref 0.00–0.07)
Basophils Absolute: 0.1 10*3/uL (ref 0.0–0.1)
Basophils Relative: 1 %
Eosinophils Absolute: 0.4 10*3/uL (ref 0.0–0.5)
Eosinophils Relative: 4 %
HCT: 39.4 % (ref 36.0–46.0)
Hemoglobin: 11 g/dL — ABNORMAL LOW (ref 12.0–15.0)
Immature Granulocytes: 1 %
Lymphocytes Relative: 22 %
Lymphs Abs: 2.1 10*3/uL (ref 0.7–4.0)
MCH: 26.8 pg (ref 26.0–34.0)
MCHC: 27.9 g/dL — ABNORMAL LOW (ref 30.0–36.0)
MCV: 95.9 fL (ref 80.0–100.0)
Monocytes Absolute: 0.8 10*3/uL (ref 0.1–1.0)
Monocytes Relative: 9 %
Neutro Abs: 6.2 10*3/uL (ref 1.7–7.7)
Neutrophils Relative %: 63 %
Platelets: 504 10*3/uL — ABNORMAL HIGH (ref 150–400)
RBC: 4.11 MIL/uL (ref 3.87–5.11)
RDW: 16.2 % — ABNORMAL HIGH (ref 11.5–15.5)
WBC: 9.7 10*3/uL (ref 4.0–10.5)
nRBC: 0 % (ref 0.0–0.2)

## 2020-05-20 LAB — RESPIRATORY PANEL BY RT PCR (FLU A&B, COVID)
Influenza A by PCR: NEGATIVE
Influenza B by PCR: NEGATIVE
SARS Coronavirus 2 by RT PCR: NEGATIVE

## 2020-05-20 LAB — BRAIN NATRIURETIC PEPTIDE: B Natriuretic Peptide: 80 pg/mL (ref 0.0–100.0)

## 2020-05-20 LAB — BLOOD GAS, ARTERIAL
Acid-Base Excess: 15.2 mmol/L — ABNORMAL HIGH (ref 0.0–2.0)
Bicarbonate: 41.1 mmol/L — ABNORMAL HIGH (ref 20.0–28.0)
Drawn by: 42624
FIO2: 48
O2 Saturation: 95.9 %
Patient temperature: 37.3
pCO2 arterial: 72 mmHg (ref 32.0–48.0)
pH, Arterial: 7.376 (ref 7.350–7.450)
pO2, Arterial: 89.1 mmHg (ref 83.0–108.0)

## 2020-05-20 LAB — HEMOGLOBIN A1C
Hgb A1c MFr Bld: 6.4 % — ABNORMAL HIGH (ref 4.8–5.6)
Mean Plasma Glucose: 136.98 mg/dL

## 2020-05-20 LAB — GLUCOSE, CAPILLARY: Glucose-Capillary: 204 mg/dL — ABNORMAL HIGH (ref 70–99)

## 2020-05-20 LAB — TROPONIN I (HIGH SENSITIVITY)
Troponin I (High Sensitivity): 9 ng/L (ref <18)
Troponin I (High Sensitivity): 10 ng/L (ref <18)

## 2020-05-20 MED ORDER — ALBUTEROL SULFATE HFA 108 (90 BASE) MCG/ACT IN AERS
2.0000 | INHALATION_SPRAY | RESPIRATORY_TRACT | Status: DC | PRN
  Filled 2020-05-20: qty 6.7

## 2020-05-20 MED ORDER — VENLAFAXINE HCL ER 75 MG PO CP24
150.0000 mg | ORAL_CAPSULE | Freq: Every day | ORAL | Status: DC
  Administered 2020-05-21 – 2020-05-29 (×8): 150 mg via ORAL
  Filled 2020-05-20 (×8): qty 2

## 2020-05-20 MED ORDER — FLECAINIDE ACETATE 100 MG PO TABS
100.0000 mg | ORAL_TABLET | Freq: Two times a day (BID) | ORAL | Status: DC
  Administered 2020-05-20 – 2020-05-29 (×18): 100 mg via ORAL
  Filled 2020-05-20 (×20): qty 1

## 2020-05-20 MED ORDER — ALBUTEROL SULFATE (2.5 MG/3ML) 0.083% IN NEBU: 3.0000 mL | INHALATION_SOLUTION | Freq: Four times a day (QID) | RESPIRATORY_TRACT | Status: DC | PRN

## 2020-05-20 MED ORDER — SODIUM CHLORIDE 0.9 % IV SOLN: INTRAVENOUS | Status: DC

## 2020-05-20 MED ORDER — ACETAMINOPHEN 325 MG PO TABS
650.0000 mg | ORAL_TABLET | Freq: Once | ORAL | Status: AC
  Administered 2020-05-20: 650 mg via ORAL
  Filled 2020-05-20: qty 2

## 2020-05-20 MED ORDER — VENLAFAXINE HCL 37.5 MG PO TABS
37.5000 mg | ORAL_TABLET | Freq: Every day | ORAL | Status: DC
  Administered 2020-05-21 – 2020-05-29 (×8): 37.5 mg via ORAL
  Filled 2020-05-20 (×10): qty 1

## 2020-05-20 MED ORDER — PANTOPRAZOLE SODIUM 40 MG PO TBEC
40.0000 mg | DELAYED_RELEASE_TABLET | Freq: Every day | ORAL | Status: DC
  Administered 2020-05-20 – 2020-05-29 (×9): 40 mg via ORAL
  Filled 2020-05-20 (×9): qty 1

## 2020-05-20 MED ORDER — IOHEXOL 350 MG/ML SOLN
100.0000 mL | Freq: Once | INTRAVENOUS | Status: AC | PRN
  Administered 2020-05-21: 100 mL via INTRAVENOUS

## 2020-05-20 MED ORDER — IPRATROPIUM-ALBUTEROL 0.5-2.5 (3) MG/3ML IN SOLN
3.0000 mL | Freq: Four times a day (QID) | RESPIRATORY_TRACT | Status: DC | PRN
  Administered 2020-05-20 – 2020-05-29 (×9): 3 mL via RESPIRATORY_TRACT
  Filled 2020-05-20 (×6): qty 3

## 2020-05-20 MED ORDER — BISACODYL 5 MG PO TBEC: 5.0000 mg | DELAYED_RELEASE_TABLET | Freq: Every day | ORAL | Status: DC | PRN

## 2020-05-20 MED ORDER — ATORVASTATIN CALCIUM 40 MG PO TABS
40.0000 mg | ORAL_TABLET | Freq: Every evening | ORAL | Status: DC
  Administered 2020-05-20 – 2020-05-29 (×8): 40 mg via ORAL
  Filled 2020-05-20 (×8): qty 1

## 2020-05-20 MED ORDER — DILTIAZEM HCL ER COATED BEADS 120 MG PO CP24: 120.0000 mg | ORAL_CAPSULE | Freq: Every day | ORAL | Status: DC

## 2020-05-20 MED ORDER — FUROSEMIDE 40 MG PO TABS
60.0000 mg | ORAL_TABLET | Freq: Every day | ORAL | Status: DC
  Administered 2020-05-21 – 2020-05-24 (×4): 60 mg via ORAL
  Filled 2020-05-20 (×4): qty 1

## 2020-05-20 MED ORDER — POLYETHYLENE GLYCOL 3350 17 G PO PACK: 17.0000 g | PACK | Freq: Every day | ORAL | Status: DC | PRN

## 2020-05-20 MED ORDER — FUROSEMIDE 10 MG/ML IJ SOLN
60.0000 mg | Freq: Once | INTRAMUSCULAR | Status: AC
  Administered 2020-05-20: 60 mg via INTRAVENOUS
  Filled 2020-05-20: qty 6

## 2020-05-20 MED ORDER — APIXABAN 5 MG PO TABS
5.0000 mg | ORAL_TABLET | Freq: Two times a day (BID) | ORAL | Status: DC
  Administered 2020-05-20 – 2020-05-24 (×9): 5 mg via ORAL
  Filled 2020-05-20 (×9): qty 1

## 2020-05-20 MED ORDER — METOPROLOL TARTRATE 12.5 MG HALF TABLET
12.5000 mg | ORAL_TABLET | Freq: Two times a day (BID) | ORAL | Status: DC
  Administered 2020-05-20 – 2020-05-24 (×8): 12.5 mg via ORAL
  Filled 2020-05-20 (×8): qty 1

## 2020-05-20 MED ORDER — INSULIN ASPART 100 UNIT/ML ~~LOC~~ SOLN: 0.0000 [IU] | Freq: Every day | SUBCUTANEOUS | Status: DC

## 2020-05-20 MED ORDER — KETOTIFEN FUMARATE 0.025 % OP SOLN
1.0000 [drp] | Freq: Two times a day (BID) | OPHTHALMIC | Status: DC | PRN
  Administered 2020-05-20: 1 [drp] via OPHTHALMIC
  Filled 2020-05-20: qty 5

## 2020-05-20 MED ORDER — PREDNISONE 20 MG PO TABS: 20.0000 mg | ORAL_TABLET | Freq: Two times a day (BID) | ORAL | Status: DC

## 2020-05-20 MED ORDER — MELATONIN 3 MG PO TABS
3.0000 mg | ORAL_TABLET | Freq: Every evening | ORAL | Status: DC | PRN
  Administered 2020-05-21 – 2020-05-28 (×5): 3 mg via ORAL
  Filled 2020-05-20 (×6): qty 1

## 2020-05-20 MED ORDER — AZITHROMYCIN 250 MG PO TABS: 250.0000 mg | ORAL_TABLET | Freq: Every day | ORAL | Status: DC

## 2020-05-20 MED ORDER — DILTIAZEM HCL ER COATED BEADS 120 MG PO CP24
120.0000 mg | ORAL_CAPSULE | Freq: Every day | ORAL | Status: DC
  Administered 2020-05-20 – 2020-05-24 (×5): 120 mg via ORAL
  Filled 2020-05-20 (×5): qty 1

## 2020-05-20 MED ORDER — ACETAMINOPHEN 325 MG PO TABS
650.0000 mg | ORAL_TABLET | Freq: Four times a day (QID) | ORAL | Status: DC | PRN
  Administered 2020-05-20 – 2020-05-22 (×4): 650 mg via ORAL
  Filled 2020-05-20 (×4): qty 2

## 2020-05-20 MED ORDER — GABAPENTIN 100 MG PO CAPS
200.0000 mg | ORAL_CAPSULE | Freq: Three times a day (TID) | ORAL | Status: DC
  Administered 2020-05-20 – 2020-05-29 (×24): 200 mg via ORAL
  Filled 2020-05-20 (×24): qty 2

## 2020-05-20 MED ORDER — DICLOFENAC SODIUM 1 % TD GEL
2.0000 g | Freq: Four times a day (QID) | TRANSDERMAL | Status: DC | PRN
  Filled 2020-05-20: qty 100

## 2020-05-20 MED ORDER — INSULIN ASPART 100 UNIT/ML ~~LOC~~ SOLN
0.0000 [IU] | Freq: Three times a day (TID) | SUBCUTANEOUS | Status: DC
  Administered 2020-05-21: 2 [IU] via SUBCUTANEOUS
  Administered 2020-05-22: 3 [IU] via SUBCUTANEOUS

## 2020-05-20 MED ORDER — ALBUTEROL SULFATE (2.5 MG/3ML) 0.083% IN NEBU: 3.0000 mL | INHALATION_SOLUTION | RESPIRATORY_TRACT | Status: DC

## 2020-05-20 NOTE — H&P (Addendum)
[facility] Admission History and Physical Service Pager: [phone]  Patient name: [name] White Medical record number: [id number] Date of birth: 1955-12-12 Age: 64 y.o. Gender: female  Primary Care Provider: Richarda Osmond, DO Consultants: None Code Status: DNR  Preferred Emergency Contact: Niece Jonelle Sidle [phone]  Chief Complaint: Can't breathe, short of breath, sinus infection, need more oxygen  Assessment and Plan: [name] White is a 64 y.o. female presenting with 2-week progressive history of dyspnea, chest tightness, and increased oxygen requirement. PMH is significant for OHS OSA, HFpEF, HTN, paroxysmal A. fib on Eliquis, T2DM, MDD, GERD, osteoarthritis, morbid obesity, remote tobacco use with 20-pack-year cigarette history.  Acute on Chronic Hypoxic/Hypercapneic Respiratory failure Likely secondary to OHS in the setting of diastolic heart failure   Two-week progressive history of dyspnea, chest tightness, and increased oxygen requirement. She arrived to the ED via EMS, she called over concern for worsening dyspnea.  In transport, received CPAP, Solu-Medrol, magnesium.  SPO2 in ED on nonrebreather in the 70s. S/p Lasix 60 mg IV once and Tylenol in the ED.  Now satting > 96% on 10 L via HFNC. Patient is on 5-6L at home at baseline.  BNP 80.0, troponin 9.  This is her typical presentation and has had multiple similar admissions in the past. Her presentation is likely due to OHS in the setting of diastolic heart failure. She does report using her Trilogy nightly. Her BNP is stable at 80. Unlikely obstructive process given no wheezing, no increased sputum production on exam. Objective findings not consistent with pneumonia or infectious process. HFpEF exacerbation supported by dyspnea and increased BLE edema but less likely due to stable BNP and no crackles or rales upon physical exam.  PE possible; Wells score is 6, currently tachycardic, personal  history of PE but is on anticoagulation for A. fib and has not missed any doses. Will obtain CTA to rule out PE. Patient has enlarged RV on last echo (11/24/19) with no RVH in 05/2019 echo. Can consider pulmonary hypertension and consider obtaining right heart cath.  -Admit to observation, med telemetry floor with Dr. Gwendlyn Deutscher attending  - Respiratory therapy consulted, appreciate their care with this patient  - ABG - BIPAP PRN  - CPAP nightly  -CTA PE chest to rule out PE -Continue anticoagulation with home Eliquis 5 mg twice daily -PT/OT eval and treat -f/u CTA   OHS  Suspect OSA (No PSG) Normally on 5 to 6 L of oxygen at home, currently on 10 L with SPO2 >96%.  On admission patient appears to be in mild to moderate distress, no wheezing or crackles noted on physical exam, although intentional grunting was noted.  Patient reports that she has asthma/COPD; however, PFTs in the past do not show obstructive process. Followed with pulmonology, last seen 04/15/20 with no major changes in her care.  Home meds include albuterol inhaler, duo nebs PRN, trilogy & O2.  -Continue home medicines -Monitor respiratory status -Wean O2 to home levels (5-6L) as tolerated -Incentive spirometry every 4 hours -Ambulate patient every shift  HFpEF  Hypertension Home medications include atorvastatin, Cardizem, metoprolol. Initially hypertensive in the ED, but once admitted normotensive with BPs 116-120/68-89, last 116/89 with pulse 127. -Start home meds -Decline to consult cardiology at this time, may in future if clinically indicated  Asymmetric BLE edema Noted on admission physical exam, left leg more swollen than the right.  Left leg 1+ pitting edema to level of knee, right leg trace pitting edema.  No erythema, warmth, tenderness, or cords palpated.  Patient notes that this asymmetry is normal for her but the amount of swelling is increased from her baseline.  She reports having previous Doppler studies  without ever finding clots. -Continue to monitor -Ambulate patient -Activity as tolerated -Elevate legs  Paroxysmal A. fib on Eliquis At home takes Eliquis 5 mg BID, Cardizem & flecanide.  Patient noted to be in sinus tachycardia upon admission. -Continuous telemetry 24 to 48 hours -Continue home medications   OSA Wears CPAP at night.  Will provide here while inpatient.  Type 2 diabetes mellitus Point-of-care glucose on admission 99.  No current home meds, appears to be controlled by diet. She did receive steroids x 1 in the ED. Today, A1C 6.4.  -monitor with BMP   Thrombocytosis, chronic  Platelets 504 on admission.  Normocytic anemia: Chronic On admission, hemoglobin 11.0, MCV 95.9.    MDD: Chronic, stable Home meds include venlafaxine total dose 187.5 mg daily. -Continue home med  GERD: Chronic, stable Home meds include omeprazole 20 mg daily. -Switch to Protonix while inpatient  Osteoarthritis: Chronic, stable Home meds include gabapentin 200 mg 3 times daily. -Continue home med  FEN/GI: regular diet Prophylaxis: On Eliquis   Disposition: MedTele  History of Present Illness:  [name] White is a 64 y.o. female presenting with 2-week progressive history of dyspnea, chest tightness, and increased oxygen requirement started with what she thinks is a sinus infection.  Two weeks ago, she reports postnasal drip "that is dripping down into my chest and bothering my lungs".  She then started developing shortness of breath and chest tightness.  Is normally on 5-6 l of oxygen at home, but here recently has required 7 L.  Also reports some nausea and increased lower leg edema, worse in the left side.  Denies fever, chills, crushing chest pain, vomiting, diarrhea, constipation.  She arrived to the ED via EMS, she called over concern for worsening dyspnea.  In transport, received CPAP, Solu-Medrol, magnesium.  SPO2 in ED on nonrebreather in the 70s.  S/p Lasix 60 mg IV once and  Tylenol in the ED.  Now satting > 96% on 10 L via HFNC. Per ED report, is fully vaccinated against Covid.  Review Of Systems: Per HPI with the following additions:   Review of Systems  HENT: Positive for congestion and postnasal drip.   Respiratory: Positive for cough, chest tightness, shortness of breath and wheezing.   Cardiovascular: Negative for chest pain.  Gastrointestinal: Positive for nausea. Negative for constipation, diarrhea and vomiting.  Genitourinary: Negative for decreased urine volume, difficulty urinating, dysuria, flank pain, frequency and hematuria.     Patient Active Problem List   Diagnosis Date Noted  . Post-nasal drip 05/19/2020  . Frequent falls 05/06/2020  . Acute exacerbation of CHF (congestive heart failure) ([place]) 03/12/2020  . Community acquired pneumonia   . Acute respiratory failure with hypoxia ([place]) 02/06/2020  . SOB (shortness of breath) 11/24/2019  . Respiratory distress   . Respiratory failure, acute-on-chronic ([place]) 11/09/2019  . Seasonal allergies 10/03/2019  . Debility 08/02/2019  . Hypertension associated with diabetes ([place])   . Weight gain due to medication 06/05/2019  . Acute on chronic heart failure with preserved ejection fraction ([place]) 03/14/2019  . Acquired thrombophilia ([place]) due to A-Fib 03/07/2019  . Heart failure with preserved ejection fraction ([place]) 03/06/2019  . Obesity hypoventilation syndrome ([place]) 03/06/2019  . Lower GI bleed   . COPD exacerbation ([place]) 02/26/2019  . Urge  incontinence 01/27/2019  . Shortness of breath 08/29/2018  . OSA (obstructive sleep apnea) 07/05/2018  . Respiratory failure ([place]) 07/05/2018  . Severe episode of recurrent major depressive disorder, without psychotic features ([place])   . MDD (major depressive disorder), recurrent episode, severe ([place]) 09/07/2015  . Atrial flutter ([place]) 07/29/2015  . Asthma 11/12/2014  . History of hypertension 11/05/2014  . Paroxysmal atrial fibrillation ([place]) 11/03/2014   . Chronic anticoagulation 11/03/2014  . Major depressive disorder 11/03/2014  . History of pulmonary embolus (PE) 11/03/2014  . MDD (major depressive disorder), recurrent severe, without psychosis ([place]) 03/05/2014  . Hot flashes 04/22/2011  . OVERACTIVE BLADDER 04/18/2008  . Osteoarthritis of both knees 04/18/2008  . Osteoarthritis involving multiple joints on both sides of body 09/14/2007  . Morbid obesity ([place]) 08/24/2006  . RESTLESS LEGS SYNDROME 08/24/2006  . HYPERTENSION, BENIGN SYSTEMIC 08/24/2006  . RHINITIS, ALLERGIC 08/24/2006  . GASTROESOPHAGEAL REFLUX, NO ESOPHAGITIS 08/24/2006    Past Medical History: Past Medical History:  Diagnosis Date  . Abdominal wall hematoma 03/06/2019  . Acute bronchitis 08/29/2018  . Acute on chronic respiratory failure with hypoxia ([place]) 08/29/2018  . Anxiety   . Asthma   . Chronic lower back pain   . COPD (chronic obstructive pulmonary disease) ([place])   . Depression   . Exposure to COVID-19 virus 11/02/2018  . Fall 02/2019  . Family history of anesthesia complication    "daughter; causes her to pass out afterwards"  . GERD (gastroesophageal reflux disease)   . History of atrial flutter 06/26/2015  . History of pulmonary embolus (PE) 11/03/2014  . Hypertension   . Hyperthyroidism   . Left medial tibial stress syndrome 12/26/2017  . Lower GI bleed   . Migraine    "monthly" (12/28/2013)  . Non-traumatic rhabdomyolysis 11/03/2014  . Obesity hypoventilation syndrome ([place]) 03/06/2019  . Osteoarthritis    "both knees; back of my neck; right pelvic bone" (12/28/2013)  . Paroxysmal A-fib ([place])   . Pulmonary embolism ([place]) 12/28/2013   "2 clots in each lung"    Past Surgical History: Past Surgical History:  Procedure Laterality Date  . ABDOMINAL HYSTERECTOMY    . APPENDECTOMY    . BREAST CYST EXCISION Right   . DILATION AND CURETTAGE OF UTERUS    . ELECTROPHYSIOLOGIC STUDY N/A 05/27/2015   Procedure: A-Flutter;  Surgeon: Evans Lance, MD;  Location:  [facility];  Service: Cardiovascular;  Laterality: N/A;  . EXCISIONAL HEMORRHOIDECTOMY    . NASAL SINUS SURGERY  2007  . THYROIDECTOMY, PARTIAL Right 2005  . TUBAL LIGATION    . WISDOM TOOTH EXTRACTION      Social History: Social History   Tobacco Use  . Smoking status: Former Smoker    Packs/day: 0.25    Years: 20.00    Pack years: 5.00    Types: Cigarettes    Quit date: 07/17/1999    Years since quitting: 20.8  . Smokeless tobacco: Never Used  Vaping Use  . Vaping Use: Never used  Substance Use Topics  . Alcohol use: Not Currently    Comment: "drank some in my 30's"  . Drug use: No    Please also refer to relevant sections of EMR.  Family History: Family History  Problem Relation Age of Onset  . Osteoarthritis Mother   . Asthma Mother   . Heart failure Mother   . Breast cancer Daughter     Allergies and Medications: Allergies  Allergen Reactions  . Caffeine Other (See Comments)  Migraine  . Hydralazine Hcl Other (See Comments)    AKI leading to rhabdo and electrolyte abnormalities  . Hydrocodone Nausea And Vomiting    Headache, also  . Ciprofloxacin Hives and Rash  . Erythromycin Hives and Rash  . Lisinopril Cough  . Oxycodone Nausea And Vomiting and Other (See Comments)    Headaches, also  . Sulfamethoxazole-Trimethoprim Rash  . Tape Rash and Other (See Comments)   No current facility-administered medications on file prior to encounter.   Current Outpatient Medications on File Prior to Encounter  Medication Sig Dispense Refill  . acetaminophen (TYLENOL) 325 MG tablet Take 2 tablets (650 mg total) by mouth every 6 (six) hours as needed for mild pain.    Marland Kitchen albuterol (VENTOLIN HFA) 108 (90 Base) MCG/ACT inhaler Inhale 2 puffs into the lungs every 4 (four) hours as needed for wheezing or shortness of breath. 18 g 1  . apixaban (ELIQUIS) 5 MG TABS tablet Take 1 tablet (5 mg total) by mouth 2 (two) times daily. 180 tablet 1  . atorvastatin (LIPITOR)  40 MG tablet Take 1 tablet (40 mg total) by mouth daily. (Patient taking differently: Take 40 mg by mouth every evening. ) 90 tablet 3  . azelastine (OPTIVAR) 0.05 % ophthalmic solution INSTILL 1 DROP IN BOTH EYES TWICE DAILY AS NEEDED FOR ITCHY EYES 6 mL 1  . cetirizine (ZYRTEC) 10 MG tablet TAKE 1 TABLET(10 MG) BY MOUTH DAILY (Patient taking differently: Take 10 mg by mouth daily. ) 30 tablet 1  . cyclobenzaprine (FLEXERIL) 5 MG tablet TAKE 1 TABLET(5 MG) BY MOUTH TWICE DAILY AS NEEDED FOR MUSCLE SPASMS 30 tablet 1  . diclofenac sodium (VOLTAREN) 1 % GEL APPLY 2 GRAMS EXTERNALLY TO THE AFFECTED AREA FOUR TIMES DAILY (Patient taking differently: Apply 2 g topically 4 (four) times daily as needed (arthritis pain - knees, shoulder and thighs). ) 100 g 0  . diltiazem (CARDIZEM CD) 120 MG 24 hr capsule TAKE 1 CAPSULE BY MOUTH ONCE DAILY 30 capsule 0  . flecainide (TAMBOCOR) 100 MG tablet TAKE 1 TABLET BY MOUTH TWICE DAILY 180 tablet 3  . fluticasone (FLONASE) 50 MCG/ACT nasal spray Place 2 sprays into both nostrils daily as needed for allergies or rhinitis.     . furosemide (LASIX) 40 MG tablet Take 1.5 tablets (60 mg total) by mouth daily. 180 tablet 1  . gabapentin (NEURONTIN) 100 MG capsule Take 2 capsules (200 mg total) by mouth 3 (three) times daily. 180 capsule 1  . guaiFENesin (MUCINEX) 600 MG 12 hr tablet Take 1 tablet (600 mg total) by mouth 2 (two) times daily as needed for cough. 30 tablet 0  . hydrOXYzine (VISTARIL) 25 MG capsule TAKE 1 CAPSULE(25 MG) BY MOUTH DAILY AS NEEDED FOR ANXIETY 60 capsule 0  . ipratropium (ATROVENT) 0.06 % nasal spray Place 2 sprays into both nostrils 4 (four) times daily. 15 mL 12  . ipratropium-albuterol (DUONEB) 0.5-2.5 (3) MG/3ML SOLN Take 3 mLs by nebulization every 6 (six) hours as needed (shortness of breath, wheezing). 360 mL 0  . Melatonin 3 MG TABS Take 1 tablet (3 mg total) by mouth at bedtime as needed (sleep). (Patient taking differently: Take 3 mg by  mouth at bedtime. ) 30 tablet 0  . metoprolol tartrate (LOPRESSOR) 25 MG tablet TAKE 1/2 TABLET(12.5 MG) BY MOUTH TWICE DAILY (Patient taking differently: Take 12.5 mg by mouth 2 (two) times daily. ) 90 tablet 0  . Multiple Vitamin (MULTIVITAMIN) tablet Take 1 tablet by  mouth daily.    Marland Kitchen omeprazole (PRILOSEC) 20 MG capsule Take 1 capsule (20 mg total) by mouth daily. 90 capsule 0  . OXYGEN Inhale 6-7 L/min into the lungs continuous.     . potassium chloride (KLOR-CON) 10 MEQ tablet TAKE 2 TABLETS(20 MEQ) BY MOUTH DAILY (Patient taking differently: Take 20 mEq by mouth daily. ) 180 tablet 0  . PRESCRIPTION MEDICATION See admin instructions. Trilogy [employer]-  At bedtime and during any time of rest    . venlafaxine (EFFEXOR) 37.5 MG tablet Take 1 tablet (37.5 mg total) by mouth daily with breakfast. Take with a 150mg  ER capsule every morning for a total of 187.5mg  90 tablet 2  . venlafaxine XR (EFFEXOR-XR) 150 MG 24 hr capsule Take 1 capsule (150 mg total) by mouth daily with breakfast. Take with a 37.5 mg tablet every morning (Patient taking differently: Take 150 mg by mouth daily with breakfast. Take with a 37.5mg  tablet every morning for a total of 187.5mg ) 90 capsule 3    Objective: BP 119/81   Pulse (!) 112   Temp 98.7 F (37.1 C) (Oral)   Resp (!) 28   Ht 5\' 5"  (1.651 m)   SpO2 99%   BMI 58.24 kg/m  Exam: General: awake, alert obese female in minimal to moderate distress Eyes: EOM intact Cardiovascular: Regular rate and rhythm with respiratory variability, S1 and S2 auscultated, no murmurs appreciated Respiratory: Clear to auscultation bilaterally, transmitted upper airway sounds (intentional grunting during physical exam), no wheezing appreciated MSK: Moving all extremities equally, left leg with 1+ pitting edema, right leg trace edema, left more swollen and larger diameter than right (patient notes this asymmetry is normal for her however she is more swollen than  normal) Derm: Dark and dry skin overlying both shins, more so on left Neuro: Cranial nerves II through X grossly intact, moving all extremities spontaneously Psych: Appears to have normal insight and judgment  Labs and Imaging: CBC BMET  Recent Labs  Lab 05/20/20 1353  WBC 9.7  HGB 11.0*  HCT 39.4  PLT 504*   Recent Labs  Lab 05/20/20 1353  NA 141  K 3.7  CL 95*  CO2 34*  BUN 8  CREATININE 0.49  GLUCOSE 99  CALCIUM 9.0     EKG: Sinus tachycardia at 109 bpm, normal axis, no ST elevation, narrow QRS complex   PORTABLE CHEST 1 VIEW 05/20/2020 COMPARISON:  March 12, 2020 FINDINGS: Lung volumes remain low, diminished even when compared to the study of 03/12/2020. Accounting for low lung volumes cardiomediastinal contours are grossly stable but very limited assessment on today's study. Vascular congestion is cephalization of flow suggested bilaterally. LEFT hemidiaphragm is obscured. Patchy opacity in the LEFT chest could represent confluence of shadows or airspace disease. Airspace disease was seen in this area on the previous exam. On very limited assessment no acute skeletal process. IMPRESSION: 1. Low lung volumes with vascular congestion. 2. Basilar opacities and LEFT upper lobe opacities with similar appearance compared to previous chest imaging, a combination of volume loss and infection are considered.    Ezequiel Essex, MD 05/20/2020, 3:15 PM PGY-1, [facility] Intern pager: [phone], text pages welcome  FPTS Upper-Level Resident Addendum I have independently interviewed and examined the patient. I have discussed the above with the original author and agree with their documentation. My edits for correction/addition/clarification are in - green. Please see also any attending notes.  FPTS Service pager: [phone] (text pages welcome  through [place])  Wilber Oliphant, M.D.  PGY-3 05/20/2020 7:45 PM

## 2020-05-20 NOTE — ED Triage Notes (Signed)
EMS transport from home, pt complaint of being sick for over 1 week, pt complaint increase chest tightness today.

## 2020-05-20 NOTE — ED Notes (Signed)
Pt on 10L [place].

## 2020-05-20 NOTE — Patient Instructions (Signed)
Visit Information  Goals Addressed            This Visit's Progress   . Manage My COPD       Patient Goals/Self-Care Activities:   Take medications as prescribed including inhalers  Practice and use pursed lip breathing for shortness of breath recovery and prevention  Self assess COPD action plan zone and make appointment with provider if you have been in the yellow zone for 48 hours without improvement.  Engage in lite exercise as tolerated 3-5 days a week  Utilize infection prevention strategies to reduce risk of respiratory infection        Ms. Prindle was given information about Care Management services today including:  1. Care Management services include personalized support from designated clinical staff supervised by her physician, including individualized plan of care and coordination with other care providers 2. 24/7 contact phone numbers for assistance for urgent and routine care needs. 3. The patient may stop CCM services at any time (effective at the end of the month) by phone call to the office staff.  Patient agreed to services and verbal consent obtained.   The patient verbalized understanding of instructions, educational materials, and care plan provided today and declined offer to receive copy of patient instructions, educational materials, and care plan.   The care management team will reach out to the patient again over the next 10 days.   Lazaro Arms RN, BSN, [facility] Care Management Coordinator [place] Phone: [phone] I Fax: [phone]

## 2020-05-20 NOTE — Chronic Care Management (AMB) (Signed)
   Social Work  Care Management Collaboration 05/20/2020 Name: [name] White MRN: [id number] DOB: Nov 18, 1955 [name] is a 64 y.o. year old female who sees Richarda Osmond, DO for primary care. LCSW was consulted by PCP to assistance patient with rehab placement.  Concerns during this encounter: LCSW continues to encounter barriers with placement from home for rehab.  As of today no bed offers made.   Barrier: facilties do not want to admit from home Plan:  LCSW has no options as of today.  Patient in ED will welcome assistance with placement.   Patient Care Plan: Social Work  Problem Identified: Difficult with mobility impacting Quality of Life   Goal: Facility placement for rehab   Start Date: 05/05/2020  Recent Progress: Not on track  Priority: High  Note:   Current Barriers:  Patient's health has declined and longer able to meet ADL's at home. Acknowledges deficits with placement process and needs support in order to meet this need Clinical Goal(s): Over the next 10 to 30 days patient will be placed in a facility, patient and family will work with LCSW, to coordinate needs for short term skilled rehab Placement Interventions: . Assessed needs and facility placement process;  . [facility]-unable to make bed offer after reviewing FL2 . Received return call from [facility] rehab [phone]  Per Whitney she doesn't have any beds . Called [facility] inpatient rehab- unable to take patient from home . [facility] [phone]: faxed Fl2 for review . [facility] [phone]: unable to take patient based on high needs . [facility] [phone] : Received return call , patient does not meet criteria for inpatient rehab based on not being able to do intense therapy  Patient Goals/Self-Care Activities: Over the next 20 days . Select a facilities for LCSW to contact  . Call to check on availability and have get contact information from  facilities Follow Up Plan:  . SW will follow up with patient and collaborate with CCM RN for patient's ongoing needs    . Will continue to work to assist with placement    Review of patient status, including review of consultants reports, relevant laboratory and other test results, and collaboration with appropriate care team members and the patient's provider was performed as part of comprehensive patient evaluation and provision of chronic care management services.    [name], [place] / [place]   [phone] 2:57 PM

## 2020-05-20 NOTE — Progress Notes (Addendum)
FPTS Interim Progress Note  Received page from nurse that patient is refusing CTA chest.  Spoke to patient about importance of obntaining CXTA to rule out pulmonary embolism and the complications that could result from delayed CTA.  She expresses understanding of this and still wishes to wait until the morning to obtain.  Patient is on Eliquis 5mg  BID and reports being compliant with doses.  Well score for PE 3 (receives points for tachycardia and previous PE).  Suspect her tachycardia is related to her known atrial fibrillation and had not taken home rate control agents yet today, already improving.   While we believe it is important to still obtain the CTA, will hold off on prophylactically increasing anticoagulation dose overnight given her presentation is similar to previous acute hypoxic respiratory failure admissions not attributed to PE.  Delora Fuel, MD 05/20/2020, 10:42 PM PGY-1, [facility] Service pager [phone]

## 2020-05-20 NOTE — Progress Notes (Signed)
Currently HR is 120-130 Sinus Tach/ Afib. Paged Family Med physician after patient stated she did not take her PO diltiazem today before admission. Doctor agreed to give dose that is scheduled for tomorrow 11/25  Right now. Will administer and continue to monitor patient.

## 2020-05-20 NOTE — Plan of Care (Signed)
  Problem: Education: Goal: Knowledge of General Education information will improve Description: Including pain rating scale, medication(s)/side effects and non-pharmacologic comfort measures Outcome: Progressing   Problem: Health Behavior/Discharge Planning: Goal: Ability to manage health-related needs will improve Outcome: Not Progressing   Problem: Clinical Measurements: Goal: Ability to maintain clinical measurements within normal limits will improve Outcome: Not Progressing Goal: Will remain free from infection Outcome: Not Progressing Goal: Diagnostic test results will improve Outcome: Not Progressing Goal: Respiratory complications will improve Outcome: Progressing Goal: Cardiovascular complication will be avoided Outcome: Not Progressing   Problem: Activity: Goal: Risk for activity intolerance will decrease Outcome: Not Progressing   Problem: Nutrition: Goal: Adequate nutrition will be maintained Outcome: Progressing   Problem: Coping: Goal: Level of anxiety will decrease Outcome: Not Progressing   Problem: Elimination: Goal: Will not experience complications related to bowel motility Outcome: Progressing Goal: Will not experience complications related to urinary retention Outcome: Progressing   Problem: Pain Managment: Goal: General experience of comfort will improve Outcome: Progressing   Problem: Safety: Goal: Ability to remain free from injury will improve Outcome: Progressing   Problem: Skin Integrity: Goal: Risk for impaired skin integrity will decrease Outcome: Progressing

## 2020-05-20 NOTE — Chronic Care Management (AMB) (Signed)
RN  Care Management   Follow Up Note   05/20/2020 Name: [name] White MRN: [id number] DOB: October 04, 1955  Reason for referral : No chief complaint on file.   [name] White is a 64 y.o. year old female who is a primary care patient of Richarda Osmond, DO. The care management team was consulted for assistance with care management and care coordination needs.      Assessment: Called to check on the patient's COPD.  She states that she is still having post nasal drip.    Patient Care Plan: COPD (Adult)  Problem Identified: Disease Progression (COPD)   Goal: Disease Progression Minimized or Managed   Start Date: 05/19/2020  Expected End Date: 07/20/2020  Priority: Medium  Note:   Current Barriers:   Knowledge deficit related to basic COPD self care/management   Nurse Case Manager Clinical Goal(s):  Over the next 30 days, patient will be able to verbalize understanding of COPD action plan and when to seek appropriate levels of medical care  Over the next 90 days, patient will engage in lite exercise as tolerated to build/regain stamina and strength and reduce shortness of breath through activity tolerance   Interventions:   Provided patient with  verbal COPD education on self care/management/and exacerbation prevention   Provided patient with COPD action plan and reinforced importance of daily self assessment  Provided  verbal instructions on pursed lip breathing   healthy lifestyle promoted  necessary immunization facilitated  signs/symptoms of infection reviewed medication-adherence assessment completed  self-awareness of symptom triggers encouraged  keep room cool and dark  avoid second hand smoke  identify and remove indoor air pollutants  listen for public air quality announcements every day  05/20/20 Update RNCM was notified by LCSW patient stated she was not feeling well.  RNCM contacted the patient and she stated that "My breathing is more  debilitating and shortness of breath has increased.  I'm tired and weak from being sick. I will probably need to call paramedics."  RNCM advised the patient to call.   Patient Goals/Self-Care Activities:   Take medications as prescribed including inhalers  Practice and use pursed lip breathing for shortness of breath recovery and prevention  Self assess COPD action plan zone and make appointment with provider if you have been in the yellow zone for 48 hours without improvement.  Engage in lite exercise as tolerated 3-5 days a week  Utilize infection prevention strategies to reduce risk of respiratory infection   Follow Up Plan: The care management team will reach out to the patient again over the next 10 days.     Problem Identified: Infection   Priority: Medium  Onset Date: 05/19/2020  Note:   . Chronic Disease Management support, education, and care coordination needs related to post nasal drip or per patient sinus infection as evidenced by patient saying she has mucous running down the back of her throat -   Clinical Goal(s) related to post nasal drip or per patient sinus infection as evidenced by patient saying she has mucous running down the back of her throat:  Over the next 10 days, patient will:  . Work with the care management team to address educational, disease management, and care coordination needs  . Begin or continue self health monitoring activities as directed today  use humidifier as directed by PCP . Call provider office for new or worsened signs and symptoms Oxygen saturation lower than established parameter  fever,chronic cough, mucous that may be  clear , yellow or green in color. . Verbalize basic understanding of patient centered plan of care established today  Interventions related to post nasal drip or per patient sinus infection as evidenced by patient saying she has mucous running down the back of her throat:  . Evaluation of current treatment plans and  patient's adherence to plan as established by provider . Assessed patient understanding of disease states . Assessed patient's education and care coordination needs . Provided disease specific education to patient  . Collaborated with appropriate clinical care team members regarding patient needs . patient denies fever states that she has a cough and post nasal drip  that has gone in her chest.  She states that she has seen her PCP but is unable to go into the office due to her circumstances.  She stated that she was call ed in a Nasal spray. . Patient is requesting a humidifier.  RNCM will send a staff message to her PCP for order to be sent to [facility]  Patient Goals/Self Care Activities related to post nasal drip or per patient sinus infection as evidenced by patient saying she has mucous running down the back of her throat:   . Patient verbalizes understanding of plan Self-administers medications as prescribed . Calls pharmacy for medication refills . Call's provider office for new concerns or questions  The care management team will reach out to the patient again over the next 10 days.         Review of patient status, including review of consultants reports, relevant laboratory and other test results, and collaboration with appropriate care team members and the patient's provider was performed as part of comprehensive patient evaluation and provision of chronic care management services.    SDOH (Social Determinants of Health) assessments performed: No See Care Plan activities for detailed interventions related to SDOH)    [name] White, BSN, [facility] Management Coordinator [place] Phone: [phone] Fax: [phone]

## 2020-05-20 NOTE — Progress Notes (Signed)
Patient was seen and evaluated by me today before 5 PM. I will discuss care plan with the resident and complete H&P.

## 2020-05-20 NOTE — ED Provider Notes (Signed)
[facility] EMERGENCY DEPARTMENT Provider Note   CSN: [id number] Arrival date & time: 05/20/20  1341     History No chief complaint on file.   [name] White is a 64 y.o. female.  64 year old female with history of COPD as well as CHF presents with 4 weeks of worsening shortness of breath.  States that she is chronically on home oxygen at 6 to 7 L.  Has had increasing cough with chest tightness worse with taking a deep breath.  Denies any fever or chills.  She is fully vascular against Covid.  Increased lower extremity edema noted.  Called EMS today due to worsening dyspnea.  Upon arrival, patient was on her CPAP.  They administered Solu-Medrol as well as magnesium thinking that this could be a COPD exacerbation.  Transfer for further management        Past Medical History:  Diagnosis Date  . Abdominal wall hematoma 03/06/2019  . Acute bronchitis 08/29/2018  . Acute on chronic respiratory failure with hypoxia ([place]) 08/29/2018  . Anxiety   . Asthma   . Chronic lower back pain   . COPD (chronic obstructive pulmonary disease) ([place])   . Depression   . Exposure to COVID-19 virus 11/02/2018  . Fall 02/2019  . Family history of anesthesia complication    "daughter; causes her to pass out afterwards"  . GERD (gastroesophageal reflux disease)   . History of atrial flutter 06/26/2015  . History of pulmonary embolus (PE) 11/03/2014  . Hypertension   . Hyperthyroidism   . Left medial tibial stress syndrome 12/26/2017  . Lower GI bleed   . Migraine    "monthly" (12/28/2013)  . Non-traumatic rhabdomyolysis 11/03/2014  . Obesity hypoventilation syndrome ([place]) 03/06/2019  . Osteoarthritis    "both knees; back of my neck; right pelvic bone" (12/28/2013)  . Paroxysmal A-fib ([place])   . Pulmonary embolism ([place]) 12/28/2013   "2 clots in each lung"    Patient Active Problem List   Diagnosis Date Noted  . Post-nasal drip 05/19/2020  . Frequent falls 05/06/2020  . Acute exacerbation  of CHF (congestive heart failure) ([place]) 03/12/2020  . Community acquired pneumonia   . Acute respiratory failure with hypoxia ([place]) 02/06/2020  . SOB (shortness of breath) 11/24/2019  . Respiratory distress   . Respiratory failure, acute-on-chronic ([place]) 11/09/2019  . Seasonal allergies 10/03/2019  . Debility 08/02/2019  . Hypertension associated with diabetes ([place])   . Weight gain due to medication 06/05/2019  . Acute on chronic heart failure with preserved ejection fraction ([place]) 03/14/2019  . Acquired thrombophilia ([place]) due to A-Fib 03/07/2019  . Heart failure with preserved ejection fraction ([place]) 03/06/2019  . Obesity hypoventilation syndrome ([place]) 03/06/2019  . Lower GI bleed   . COPD exacerbation ([place]) 02/26/2019  . Urge incontinence 01/27/2019  . Shortness of breath 08/29/2018  . OSA (obstructive sleep apnea) 07/05/2018  . Respiratory failure ([place]) 07/05/2018  . Severe episode of recurrent major depressive disorder, without psychotic features ([place])   . MDD (major depressive disorder), recurrent episode, severe ([place]) 09/07/2015  . Atrial flutter ([place]) 07/29/2015  . Asthma 11/12/2014  . History of hypertension 11/05/2014  . Paroxysmal atrial fibrillation ([place]) 11/03/2014  . Chronic anticoagulation 11/03/2014  . Major depressive disorder 11/03/2014  . History of pulmonary embolus (PE) 11/03/2014  . MDD (major depressive disorder), recurrent severe, without psychosis ([place]) 03/05/2014  . Hot flashes 04/22/2011  . OVERACTIVE BLADDER 04/18/2008  . Osteoarthritis of both knees 04/18/2008  . Osteoarthritis  involving multiple joints on both sides of body 09/14/2007  . Morbid obesity ([place]) 08/24/2006  . RESTLESS LEGS SYNDROME 08/24/2006  . HYPERTENSION, BENIGN SYSTEMIC 08/24/2006  . RHINITIS, ALLERGIC 08/24/2006  . GASTROESOPHAGEAL REFLUX, NO ESOPHAGITIS 08/24/2006    Past Surgical History:  Procedure Laterality Date  . ABDOMINAL HYSTERECTOMY    . APPENDECTOMY    . BREAST  CYST EXCISION Right   . DILATION AND CURETTAGE OF UTERUS    . ELECTROPHYSIOLOGIC STUDY N/A 05/27/2015   Procedure: A-Flutter;  Surgeon: Evans Lance, MD;  Location: [facility];  Service: Cardiovascular;  Laterality: N/A;  . EXCISIONAL HEMORRHOIDECTOMY    . NASAL SINUS SURGERY  2007  . THYROIDECTOMY, PARTIAL Right 2005  . TUBAL LIGATION    . WISDOM TOOTH EXTRACTION       OB History   No obstetric history on file.     Family History  Problem Relation Age of Onset  . Osteoarthritis Mother   . Asthma Mother   . Heart failure Mother   . Breast cancer Daughter     Social History   Tobacco Use  . Smoking status: Former Smoker    Packs/day: 0.25    Years: 20.00    Pack years: 5.00    Types: Cigarettes    Quit date: 07/17/1999    Years since quitting: 20.8  . Smokeless tobacco: Never Used  Vaping Use  . Vaping Use: Never used  Substance Use Topics  . Alcohol use: Not Currently    Comment: "drank some in my 30's"  . Drug use: No    Home Medications Prior to Admission medications   Medication Sig Start Date End Date Taking? Authorizing Provider  acetaminophen (TYLENOL) 325 MG tablet Take 2 tablets (650 mg total) by mouth every 6 (six) hours as needed for mild pain. 09/11/18   Georgette Shell, MD  albuterol (VENTOLIN HFA) 108 (90 Base) MCG/ACT inhaler Inhale 2 puffs into the lungs every 4 (four) hours as needed for wheezing or shortness of breath. 04/17/20   Anderson, Chelsey L, DO  apixaban (ELIQUIS) 5 MG TABS tablet Take 1 tablet (5 mg total) by mouth 2 (two) times daily. 12/31/19   Anderson, Chelsey L, DO  atorvastatin (LIPITOR) 40 MG tablet Take 1 tablet (40 mg total) by mouth daily. Patient taking differently: Take 40 mg by mouth every evening.  12/31/19   Anderson, Chelsey L, DO  azelastine (OPTIVAR) 0.05 % ophthalmic solution INSTILL 1 DROP IN BOTH EYES TWICE DAILY AS NEEDED FOR ITCHY EYES 05/18/20   Anderson, Chelsey L, DO  cetirizine (ZYRTEC) 10 MG tablet  TAKE 1 TABLET(10 MG) BY MOUTH DAILY Patient taking differently: Take 10 mg by mouth daily.  03/09/20   Anderson, Chelsey L, DO  cyclobenzaprine (FLEXERIL) 5 MG tablet TAKE 1 TABLET(5 MG) BY MOUTH TWICE DAILY AS NEEDED FOR MUSCLE SPASMS 05/18/20   Anderson, Chelsey L, DO  diclofenac sodium (VOLTAREN) 1 % GEL APPLY 2 GRAMS EXTERNALLY TO THE AFFECTED AREA FOUR TIMES DAILY Patient taking differently: Apply 2 g topically 4 (four) times daily as needed (arthritis pain - knees, shoulder and thighs).  07/31/18   [name], MD  diltiazem (CARDIZEM CD) 120 MG 24 hr capsule TAKE 1 CAPSULE BY MOUTH ONCE DAILY 05/15/20   Baldwin Jamaica, PA-C  flecainide (TAMBOCOR) 100 MG tablet TAKE 1 TABLET BY MOUTH TWICE DAILY 03/31/20   Sherren Mocha, MD  fluticasone [facility]) 50 MCG/ACT nasal spray Place 2 sprays into both nostrils daily  as needed for allergies or rhinitis.  10/29/18   [provider]  furosemide (LASIX) 40 MG tablet Take 1.5 tablets (60 mg total) by mouth daily. 05/15/20   Anderson, Chelsey L, DO  gabapentin (NEURONTIN) 100 MG capsule Take 2 capsules (200 mg total) by mouth 3 (three) times daily. 05/18/20   Anderson, Chelsey L, DO  guaiFENesin (MUCINEX) 600 MG 12 hr tablet Take 1 tablet (600 mg total) by mouth 2 (two) times daily as needed for cough. 02/08/20   Simmons-Robinson, Makiera, MD  hydrOXYzine (VISTARIL) 25 MG capsule TAKE 1 CAPSULE(25 MG) BY MOUTH DAILY AS NEEDED FOR ANXIETY 05/15/20   Anderson, Chelsey L, DO  ipratropium (ATROVENT) 0.06 % nasal spray Place 2 sprays into both nostrils 4 (four) times daily. 05/18/20   Anderson, Chelsey L, DO  ipratropium-albuterol (DUONEB) 0.5-2.5 (3) MG/3ML SOLN Take 3 mLs by nebulization every 6 (six) hours as needed (shortness of breath, wheezing). 11/13/19   Wilber Oliphant, MD  Melatonin 3 MG TABS Take 1 tablet (3 mg total) by mouth at bedtime as needed (sleep). Patient taking differently: Take 3 mg by mouth at bedtime.  07/07/19   Brimage, Ronnette Juniper, DO   metoprolol tartrate (LOPRESSOR) 25 MG tablet TAKE 1/2 TABLET(12.5 MG) BY MOUTH TWICE DAILY Patient taking differently: Take 12.5 mg by mouth 2 (two) times daily.  03/09/20   Doristine Mango L, DO  Multiple Vitamin (MULTIVITAMIN) tablet Take 1 tablet by mouth daily.    [provider]  omeprazole (PRILOSEC) 20 MG capsule Take 1 capsule (20 mg total) by mouth daily. 03/16/20   Anderson, Chelsey L, DO  OXYGEN Inhale 6-7 L/min into the lungs continuous.     [provider]  potassium chloride (KLOR-CON) 10 MEQ tablet TAKE 2 TABLETS(20 MEQ) BY MOUTH DAILY Patient taking differently: Take 20 mEq by mouth daily.  11/26/19   [name], MD  PRESCRIPTION MEDICATION See admin instructions. [employer]-  At bedtime and during any time of rest    [provider]  venlafaxine (EFFEXOR) 37.5 MG tablet Take 1 tablet (37.5 mg total) by mouth daily with breakfast. Take with a 150mg  ER capsule every morning for a total of 187.5mg  05/18/20   Ouida Sills, Chelsey L, DO  venlafaxine XR (EFFEXOR-XR) 150 MG 24 hr capsule Take 1 capsule (150 mg total) by mouth daily with breakfast. Take with a 37.5 mg tablet every morning Patient taking differently: Take 150 mg by mouth daily with breakfast. Take with a 37.5mg  tablet every morning for a total of 187.5mg  12/16/19   [name], MD    Allergies    Caffeine, Hydralazine hcl, Hydrocodone, Ciprofloxacin, Erythromycin, Lisinopril, Oxycodone, Sulfamethoxazole-trimethoprim, and Tape  Review of Systems   Review of Systems  All other systems reviewed and are negative.   Physical Exam Updated Vital Signs BP (!) 163/134 (BP Location: Right Arm)   Pulse (!) 112   Temp 98.7 F (37.1 C) (Oral)   Resp (!) 26   SpO2 98%   Physical Exam Vitals and nursing note reviewed.  Constitutional:      General: She is not in acute distress.    Appearance: Normal appearance. She is well-developed. She is not toxic-appearing.   HENT:     Head: Normocephalic and atraumatic.  Eyes:     General: Lids are normal.     Conjunctiva/sclera: Conjunctivae normal.     Pupils: Pupils are equal, round, and reactive to light.  Neck:     Thyroid: No thyroid  mass.     Trachea: No tracheal deviation.  Cardiovascular:     Rate and Rhythm: Normal rate and regular rhythm.     Heart sounds: Normal heart sounds. No murmur heard.  No gallop.   Pulmonary:     Effort: Respiratory distress and retractions present.     Breath sounds: Decreased air movement present. No stridor. Examination of the right-upper field reveals decreased breath sounds. Examination of the left-upper field reveals decreased breath sounds. Decreased breath sounds present. No wheezing, rhonchi or rales.  Abdominal:     General: Bowel sounds are normal. There is no distension.     Palpations: Abdomen is soft.     Tenderness: There is no abdominal tenderness. There is no rebound.  Musculoskeletal:        General: No tenderness. Normal range of motion.     Cervical back: Normal range of motion and neck supple.  Skin:    General: Skin is warm and dry.     Findings: No abrasion or rash.  Neurological:     Mental Status: She is alert and oriented to person, place, and time.     GCS: GCS eye subscore is 4. GCS verbal subscore is 5. GCS motor subscore is 6.     Cranial Nerves: No cranial nerve deficit.     Sensory: No sensory deficit.  Psychiatric:        Speech: Speech normal.        Behavior: Behavior normal.     ED Results / Procedures / Treatments   Labs (all labs ordered are listed, but only abnormal results are displayed) Labs Reviewed  RESPIRATORY PANEL BY RT PCR (FLU A&B, COVID)  CBC WITH DIFFERENTIAL/PLATELET  COMPREHENSIVE METABOLIC PANEL  BRAIN NATRIURETIC PEPTIDE  TROPONIN I (HIGH SENSITIVITY)    EKG None  Radiology No results found.  Procedures Procedures (including critical care time)  Medications Ordered in ED Medications  0.9  %  sodium chloride infusion (has no administration in time range)    ED Course  I have reviewed the triage vital signs and the nursing notes.  Pertinent labs & imaging results that were available during my care of the patient were reviewed by me and considered in my medical decision making (see chart for details).    MDM Rules/Calculators/A&P                          Patient has evidence of fluid overload on chest x-ray.  Will give dose of IV Lasix here and admit to the family practice teaching service Final Clinical Impression(s) / ED Diagnoses Final diagnoses:  None    Rx / DC Orders ED Discharge Orders    None       Lacretia Leigh, MD 05/20/20 1516

## 2020-05-21 ENCOUNTER — Observation Stay (HOSPITAL_COMMUNITY): Payer: Medicare HMO

## 2020-05-21 DIAGNOSIS — R401 Stupor: Secondary | ICD-10-CM | POA: Diagnosis not present

## 2020-05-21 DIAGNOSIS — R0600 Dyspnea, unspecified: Secondary | ICD-10-CM

## 2020-05-21 DIAGNOSIS — R079 Chest pain, unspecified: Secondary | ICD-10-CM | POA: Diagnosis not present

## 2020-05-21 DIAGNOSIS — J9601 Acute respiratory failure with hypoxia: Secondary | ICD-10-CM | POA: Diagnosis not present

## 2020-05-21 DIAGNOSIS — R0602 Shortness of breath: Secondary | ICD-10-CM | POA: Diagnosis not present

## 2020-05-21 DIAGNOSIS — J449 Chronic obstructive pulmonary disease, unspecified: Secondary | ICD-10-CM | POA: Diagnosis not present

## 2020-05-21 DIAGNOSIS — E1159 Type 2 diabetes mellitus with other circulatory complications: Secondary | ICD-10-CM | POA: Diagnosis not present

## 2020-05-21 LAB — COMPREHENSIVE METABOLIC PANEL
ALT: 19 U/L (ref 0–44)
AST: 19 U/L (ref 15–41)
Albumin: 2.9 g/dL — ABNORMAL LOW (ref 3.5–5.0)
Alkaline Phosphatase: 99 U/L (ref 38–126)
Anion gap: 9 (ref 5–15)
BUN: 10 mg/dL (ref 8–23)
CO2: 38 mmol/L — ABNORMAL HIGH (ref 22–32)
Calcium: 9.2 mg/dL (ref 8.9–10.3)
Chloride: 94 mmol/L — ABNORMAL LOW (ref 98–111)
Creatinine, Ser: 0.57 mg/dL (ref 0.44–1.00)
GFR, Estimated: >60 mL/min (ref >60)
Glucose, Bld: 157 mg/dL — ABNORMAL HIGH (ref 70–99)
Potassium: 4 mmol/L (ref 3.5–5.1)
Sodium: 141 mmol/L (ref 135–145)
Total Bilirubin: 0.4 mg/dL (ref 0.3–1.2)
Total Protein: 8 g/dL (ref 6.5–8.1)

## 2020-05-21 LAB — CBC
HCT: 39.7 % (ref 36.0–46.0)
Hemoglobin: 11.5 g/dL — ABNORMAL LOW (ref 12.0–15.0)
MCH: 27.4 pg (ref 26.0–34.0)
MCHC: 29 g/dL — ABNORMAL LOW (ref 30.0–36.0)
MCV: 94.7 fL (ref 80.0–100.0)
Platelets: 595 10*3/uL — ABNORMAL HIGH (ref 150–400)
RBC: 4.19 MIL/uL (ref 3.87–5.11)
RDW: 16.2 % — ABNORMAL HIGH (ref 11.5–15.5)
WBC: 10.1 10*3/uL (ref 4.0–10.5)
nRBC: 0 % (ref 0.0–0.2)

## 2020-05-21 LAB — GLUCOSE, CAPILLARY
Glucose-Capillary: 113 mg/dL — ABNORMAL HIGH (ref 70–99)
Glucose-Capillary: 81 mg/dL (ref 70–99)
Glucose-Capillary: 139 mg/dL — ABNORMAL HIGH (ref 70–99)
Glucose-Capillary: 137 mg/dL — ABNORMAL HIGH (ref 70–99)

## 2020-05-21 MED ORDER — ALUM & MAG HYDROXIDE-SIMETH 200-200-20 MG/5ML PO SUSP
30.0000 mL | Freq: Four times a day (QID) | ORAL | Status: DC | PRN
  Administered 2020-05-21 – 2020-05-29 (×8): 30 mL via ORAL
  Filled 2020-05-21 (×8): qty 30

## 2020-05-21 MED ORDER — IPRATROPIUM-ALBUTEROL 0.5-2.5 (3) MG/3ML IN SOLN
3.0000 mL | Freq: Three times a day (TID) | RESPIRATORY_TRACT | Status: DC
  Administered 2020-05-21 – 2020-05-27 (×17): 3 mL via RESPIRATORY_TRACT
  Filled 2020-05-21 (×18): qty 3

## 2020-05-21 NOTE — Evaluation (Signed)
Occupational Therapy Evaluation Patient Details Name: [name] White MRN: [id number] DOB: 1955/11/24 Today's Date: 05/21/2020    History of Present Illness 64 y.o. female presenting with 2-week progressive history of dyspnea, chest tightness, and increased oxygen requirement. PMH is significant for OHS OSA, HFpEF, HTN, paroxysmal A. fib on Eliquis, T2DM, MDD, GERD, osteoarthritis, morbid obesity, remote tobacco use with 20-pack-year cigarette history.   Clinical Impression   PTA, pt lives with niece and typically able to complete household ambulation with RW and complete ADLs with light assist. Pt with recent decline in mobility, unable to ambulate to bathroom for toileting/bathing tasks. Pt wears 5-6 L O2 at baseline. Pt presents with deficits in cardiopulmonary tolerance, standing balance and strength. Pt pleasant and motivated to improve walking distance and ADL independence. Pt requires overall Min A for transfers, Min A for UB ADLs and Max A for LB ADLs due to deficits. Pt received on 7 L O2 with desat to 87% after transfer, SpO2 reading at 79% during ADLs (unsure of accuracy, pt reporting mild SOB). Recommend SNF for short term rehab to maximize ADL/mobility independence and quality of life.     Follow Up Recommendations  SNF    Equipment Recommendations  None recommended by OT (appears well equipped)    Recommendations for Other Services       Precautions / Restrictions Precautions Precautions: Fall Precaution Comments: monitor SpO2 Restrictions Weight Bearing Restrictions: No      Mobility Bed Mobility Overal bed mobility: Needs Assistance Bed Mobility: Supine to Sit     Supine to sit: Supervision;HOB elevated     General bed mobility comments: received in recliner    Transfers Overall transfer level: Needs assistance Equipment used: 1 person hand held assist Transfers: Sit to/from [facility] Sit to Stand: Min assist Stand pivot transfers: Min  assist       General transfer comment: Min A for steadying in standing, Min A for stability with handheld assist from recliner to chair at sink    Balance Overall balance assessment: Needs assistance Sitting-balance support: No upper extremity supported;Feet supported Sitting balance-Leahy Scale: Good     Standing balance support: Single extremity supported;During functional activity Standing balance-Leahy Scale: Poor Standing balance comment: reliant on UE support for stability                           ADL either performed or assessed with clinical judgement   ADL Overall ADL's : Needs assistance/impaired Eating/Feeding: Independent;Sitting   Grooming: Set up;Sitting;Wash/dry face   Upper Body Bathing: Minimal assistance;Sitting Upper Body Bathing Details (indicate cue type and reason): Min A for bathing back seated at sink Lower Body Bathing: Moderate assistance;Sit to/from stand;Sitting/lateral leans Lower Body Bathing Details (indicate cue type and reason): Mod A for bathing B feet and maintaining balance with posterior hygiene Upper Body Dressing : Set up;Sitting   Lower Body Dressing: Maximal assistance;Sit to/from stand   Toilet Transfer: Minimal Scientist, forensic Details (indicate cue type and reason): simulated to chair at sink Toileting- Water quality scientist and Hygiene: Moderate assistance;Sit to/from stand;Sitting/lateral lean         General ADL Comments: Pt limited by decreased activity tolerance, desats during activity      Vision Baseline Vision/History: Wears glasses Wears Glasses: Reading only Patient Visual Report: No change from baseline Vision Assessment?: No apparent visual deficits     Perception     Praxis      Pertinent Vitals/Pain Pain  Assessment: No/denies pain     Hand Dominance Right   Extremity/Trunk Assessment Upper Extremity Assessment Upper Extremity Assessment: Overall WFL for tasks  assessed   Lower Extremity Assessment Lower Extremity Assessment: Defer to PT evaluation   Cervical / Trunk Assessment Cervical / Trunk Assessment: Other exceptions Cervical / Trunk Exceptions: excess body habitus   Communication Communication Communication: No difficulties   Cognition Arousal/Alertness: Awake/alert Behavior During Therapy: WFL for tasks assessed/performed Overall Cognitive Status: Within Functional Limits for tasks assessed                                     General Comments  Pt received on 7 L O2, reports 5-6 L is her baseline. After transfer to chair, SpO2 at 87% with 1-2 min able to recover to 92-93%. SpO2 reading during ADLs at 79% but unsure of accuracy as pt reports only mild SOB    Exercises     Shoulder Instructions      Home Living Family/patient expects to be discharged to:: Private residence Living Arrangements: Other (Comment) (niece) Available Help at Discharge: Family;Available PRN/intermittently (niece works) Type of Home: Apartment Home Access: Level entry     Home Layout: One level     Bathroom Shower/Tub: Teacher, early years/pre: Standard Bathroom Accessibility: No   Home Equipment: Environmental consultant - 2 wheels;Walker - 4 wheels;Bedside commode;Tub bench;Wheelchair - manual   Additional Comments: no longer has Home health aide      Prior Functioning/Environment Level of Independence: Needs assistance  Gait / Transfers Assistance Needed: pt ambulates short household distances at baseline, has not been able to recently due to respiratory difficult and LE weakness ADL's / Homemaking Assistance Needed: niece completes all IADLs. Niece sets up pt for sponge bathing because pt unable to walk to bathroom recently. [facility]ots to [facility] for toileting at home. Assistance for bathing back, feet   Comments: Was active with home therapy but has goal of being able to walk to bathroom in home, increasing endurance        OT Problem  List: Decreased strength;Decreased activity tolerance;Impaired balance (sitting and/or standing);Cardiopulmonary status limiting activity      OT Treatment/Interventions: Self-care/ADL training;Therapeutic exercise;Energy conservation;DME and/or AE instruction;Therapeutic activities;Patient/family education;Balance training    OT Goals(Current goals can be found in the care plan section) Acute Rehab OT Goals Patient Stated Goal: To go to rehab and be able to walk around her house and regain independence OT Goal Formulation: With patient Time For Goal Achievement: 06/04/20 Potential to Achieve Goals: Good ADL Goals Pt Will Perform Grooming: with modified independence;standing Pt Will Perform Lower Body Bathing: with modified independence;sit to/from stand;sitting/lateral leans;with adaptive equipment Pt Will Perform Lower Body Dressing: with modified independence;with adaptive equipment;sitting/lateral leans;sit to/from stand Pt Will Transfer to Toilet: with modified independence;ambulating;bedside commode Pt Will Perform Toileting - Clothing Manipulation and hygiene: with modified independence;sitting/lateral leans;sit to/from stand Additional ADL Goal #1: Pt to demonstrate implementation of at least 3 energy conservation strategies during ADLs  OT Frequency: Min 2X/week   Barriers to D/C:            Co-evaluation              AM-PAC OT "6 Clicks" Daily Activity     Outcome Measure Help from another person eating meals?: None Help from another person taking care of personal grooming?: A Little Help from another person toileting, which includes using toliet,  bedpan, or urinal?: A Lot Help from another person bathing (including washing, rinsing, drying)?: A Lot Help from another person to put on and taking off regular upper body clothing?: A Little Help from another person to put on and taking off regular lower body clothing?: A Lot 6 Click Score: 16   End of Session Equipment  Utilized During Treatment: Oxygen Nurse Communication: Mobility status;Other (comment) (O2)  Activity Tolerance: Patient tolerated treatment well Patient left: in chair;Other (comment) (with NT present finishing with bathing task)  OT Visit Diagnosis: Unsteadiness on feet (R26.81);Other abnormalities of gait and mobility (R26.89);Muscle weakness (generalized) (M62.81);Other (comment) (decreased cardiopulmonary tolerance)                Time: 7096-2836 OT Time Calculation (min): 44 min Charges:  OT General Charges $OT Visit: 1 Visit OT Evaluation $OT Eval Moderate Complexity: 1 Mod OT Treatments $Self Care/Home Management : 23-37 mins  Layla Maw, OTR/L  Layla Maw 05/21/2020, 12:15 PM

## 2020-05-21 NOTE — Evaluation (Signed)
Physical Therapy Evaluation Patient Details Name: [name] White MRN: [id number] DOB: 04-09-1956 Today's Date: 05/21/2020   History of Present Illness  64 y.o. female presenting with 2-week progressive history of dyspnea, chest tightness, and increased oxygen requirement. PMH is significant for OHS OSA, HFpEF, HTN, paroxysmal A. fib on Eliquis, T2DM, MDD, GERD, osteoarthritis, morbid obesity, remote tobacco use with 20-pack-year cigarette history.  Clinical Impression  Pt presents to PT with deficits in functional mobility, gait, balance, strength, power, activity tolerance. Pt with increased supplemental oxygen needs compared to baseline and fatigues quickly with activity, requiring multiple brief rest breaks during session. Pt requires physical assistance to transfer and to maintain balance at this time. Pt will benefit from continued acute PT POC to improve mobility quality and tolerance in an effort to restore independence in the home setting. PT recommends SNF placement at this time as the pt has reduced caregiver support.    Follow Up Recommendations SNF;Supervision for mobility/OOB    Equipment Recommendations  None recommended by PT    Recommendations for Other Services       Precautions / Restrictions Precautions Precautions: Fall Precaution Comments: monitor SpO2 Restrictions Weight Bearing Restrictions: No      Mobility  Bed Mobility Overal bed mobility: Needs Assistance Bed Mobility: Supine to Sit     Supine to sit: Supervision;HOB elevated     General bed mobility comments: use of rails    Transfers Overall transfer level: Needs assistance Equipment used: 1 person hand held assist Transfers: Sit to/from [facility] Sit to Stand: Min assist Stand pivot transfers: Min assist          Ambulation/Gait Ambulation/Gait assistance: Min assist Gait Distance (Feet): 3 Feet Assistive device: 1 person hand held assist Gait Pattern/deviations:  Wide base of support;Shuffle Gait velocity: reduced Gait velocity interpretation: <1.31 ft/sec, indicative of household ambulator General Gait Details: short shuffling steps to turn from bed to Physicist, medical    Modified Rankin (Stroke Patients Only)       Balance Overall balance assessment: Needs assistance Sitting-balance support: No upper extremity supported;Feet supported Sitting balance-Leahy Scale: Fair     Standing balance support: Bilateral upper extremity supported Standing balance-Leahy Scale: Poor Standing balance comment: reliant on UE support of bilateral hand hold                             Pertinent Vitals/Pain Pain Assessment: No/denies pain    Home Living Family/patient expects to be discharged to:: Private residence Living Arrangements: Other relatives (niece) Available Help at Discharge: Family;Available PRN/intermittently;Other (Comment) Type of Home: Apartment Home Access: Level entry     Home Layout: One level Home Equipment: Walker - 2 wheels;Walker - 4 wheels;Bedside commode;Tub bench;Wheelchair - manual Additional Comments: no longer has Home health aide    Prior Function Level of Independence: Needs assistance   Gait / Transfers Assistance Needed: pt ambulates short household distances at baseline, has not been able to recently due to respiratory difficult and LE weakness  ADL's / Homemaking Assistance Needed: niece performs all IADLs and assists with bathing        Hand Dominance   Dominant Hand: Right    Extremity/Trunk Assessment   Upper Extremity Assessment Upper Extremity Assessment: Overall WFL for tasks assessed    Lower Extremity Assessment Lower Extremity Assessment: Generalized weakness    Cervical / Trunk Assessment  Cervical / Trunk Assessment: Other exceptions Cervical / Trunk Exceptions: excess body habitus  Communication   Communication: No difficulties   Cognition Arousal/Alertness: Awake/alert Behavior During Therapy: WFL for tasks assessed/performed Overall Cognitive Status: Within Functional Limits for tasks assessed                                        General Comments General comments (skin integrity, edema, etc.): pt on 7L HFNC during session, sats stable in 90s    Exercises     Assessment/Plan    PT Assessment Patient needs continued PT services  PT Problem List Decreased strength;Decreased balance;Decreased activity tolerance;Decreased mobility;Cardiopulmonary status limiting activity       PT Treatment Interventions DME instruction;Gait training;Functional mobility training;Therapeutic activities;Therapeutic exercise;Balance training;Neuromuscular re-education;Patient/family education    PT Goals (Current goals can be found in the Care Plan section)  Acute Rehab PT Goals Patient Stated Goal: To go to rehab and be able to walk around her house and regain independence PT Goal Formulation: With patient Time For Goal Achievement: 06/04/20 Potential to Achieve Goals: Fair    Frequency Min 3X/week (3x/wk despite SNF, highly motivated)   Barriers to discharge        Co-evaluation               AM-PAC PT "6 Clicks" Mobility  Outcome Measure Help needed turning from your back to your side while in a flat bed without using bedrails?: None Help needed moving from lying on your back to sitting on the side of a flat bed without using bedrails?: None Help needed moving to and from a bed to a chair (including a wheelchair)?: A Little Help needed standing up from a chair using your arms (e.g., wheelchair or bedside chair)?: A Little Help needed to walk in hospital room?: A Little Help needed climbing 3-5 steps with a railing? : Total 6 Click Score: 18    End of Session Equipment Utilized During Treatment: Oxygen Activity Tolerance: Patient tolerated treatment well Patient left: in chair;with call  bell/phone within reach;with chair alarm set Nurse Communication: Mobility status PT Visit Diagnosis: Other abnormalities of gait and mobility (R26.89);Muscle weakness (generalized) (M62.81)    Time: 3383-2919 PT Time Calculation (min) (ACUTE ONLY): 28 min   Charges:   PT Evaluation $PT Eval Low Complexity: 1 Low          Zenaida Niece, PT, DPT Acute Rehabilitation Pager: [phone]   Zenaida Niece 05/21/2020, 10:40 AM

## 2020-05-21 NOTE — Plan of Care (Signed)

## 2020-05-21 NOTE — Progress Notes (Signed)
Family Medicine Teaching Service Daily Progress Note Intern Pager: [phone]  Patient name: [name] White Medical record number: [id number] Date of birth: [birth year] Age: 64 y.o. Gender: female  Primary Care Provider: Richarda Osmond, DO Consultants: None Code Status: DNR  Pt Overview and Major Events to Date:  11/24-patient admitted with acute on chronic hypoxic respiratory failure  Assessment and Plan: [name] White is a 64 y.o. female presenting with 2-week progressive history of dyspnea, chest tightness, and increased oxygen requirement. PMH is significant for OHS OSA, HFpEF, HTN, paroxysmal A. fib on Eliquis, T2DM, MDD, GERD, osteoarthritis, morbid obesity, remote tobacco use with 20-pack-year cigarette history.  Acute on chronic hypoxic/hypercapnic respiratory failure 2/2 OHS in setting of diastolic HF Patient reports she has had 2 weeks of worsening shortness of breath.  Patient came to the emergency department and in route received Solu-Medrol and magnesium.  She reports that her breathing has greatly improved overnight.  Reports that she wore her CPAP and she feels like she is breathing better.  PE is still in the differential given patient is still tachycardic and she has a history of a PE.  She was supposed to get a CTA last night but refused it because she said that she was too weak and tired.  Patient is currently on her home O2 levels and satting in the mid 90s.  Patient reports that although her breathing has improved she still cannot walk and so she cannot be discharged because she has no care at home due to her niece working through the weekend. -Respiratory therapy has been consulted, appreciate their care with the patient -BiPAP as needed -CPAP nightly -Follow-up on CTA PE chest to rule out PE -Continue Eliquis 5 mg twice daily -PT/OT eval and treat -Maintain O2 sats above 88% with goal being home 5-6 L O2  OHS with suspected OSA Home O2 level of 5 to 6 L.   Currently on 5L with SPO2 of 96.  Home meds include albuterol inhaler and DuoNebs as needed as well as Trelegy and home O2. -Continue home medications -Monitor respiratory status -Maintain O2 to home levels of 5-6 L with O2 goal of 88 to 92% -Incentive spirometry every 4 hours -Ambulate every shift  HFpEF  Hypertension Home medications include atorvastatin, Cardizem, metoprolol. Initially hypertensive in the ED, but once admitted normotensive with BPs 116-120/68-89, last 116/89 with pulse 127. -Start home meds -Decline to consult cardiology at this time, may in future if clinically indicated  Asymmetric BLE edema Noted on admission physical exam, left leg more swollen than the right.  Left leg 1+ pitting edema to level of knee, right leg trace pitting edema.  No erythema, warmth, tenderness, or cords palpated.  Patient notes that this asymmetry is normal for her but the amount of swelling is increased from her baseline.  She reports having previous Doppler studies without ever finding clots. -Continue to monitor -Ambulate patient -Activity as tolerated -Elevate legs  Paroxysmal A. fib on Eliquis At home takes Eliquis 5 mg twice daily, Cardizem, flecainide. Patient noted to be in sinus tachycardia upon admission. -Continuous telemetry 24 to 48 hours -Continue home medications  OSA Wears CPAP at night.  Will provide here while inpatient.  Type 2 diabetes mellitus Point-of-care glucose on admission 99.  No current home meds, appears to be controlled by diet.  Hemoglobin A1c 11/24 was 6.4.  Received steroids in the emergency department so blood glucoses may be elevated. -Monitor BMPs daily   Thrombocytosis, chronic  Platelets 504 on admission.  Normocytic anemia: Chronic On admission, hemoglobin 11.0, MCV 95.9.    MDD: Chronic, stable Home meds include venlafaxine total dose 187.5 mg daily. -Continue home med  GERD: Chronic, stable Home meds include omeprazole 20 mg  daily. -Switch to Protonix while inpatient  Osteoarthritis: Chronic, stable Home meds include gabapentin 200 mg 3 times daily. -Continue home med   FEN/GI:Regular diet PPx: On Eliquis  Subjective:  Patient reports that she feels much better this morning and that she is breathing much better.  States that she wore her CPAP at night and she feels much better today.  When asked about discharge she says that she cannot walk and needs to see PT and that the plan is for her to be admitted to inpatient rehab.  She has not yet been evaluated by PT or OT.  She says that she cannot go home because she lives with her niece and her niece is working through the weekend and so she has no one to take care of her.  Objective: Temp:  [97.5 F (36.4 C)-99.1 F (37.3 C)] 98.3 F (36.8 C) (11/25 0500) Pulse Rate:  [99-127] 99 (11/25 0500) Resp:  [17-28] 20 (11/25 0721) BP: (113-163)/(68-134) 113/98 (11/25 0500) SpO2:  [96 %-99 %] 96 % (11/25 0500) Physical Exam: General: Resting in bed comfortably, no acute distress Respiratory: Lungs are clear to auscultation bilaterally although she does have some upper airway congestion.  No wheezing noted.  Patient is on 5-6 L nasal cannula and satting 96% MSK: +1 pitting edema in lower extremities, moving all extremities without difficulty.  Left leg is larger than the right leg which is chronic  Laboratory: Recent Labs  Lab 05/20/20 1353 05/21/20 0248  WBC 9.7 10.1  HGB 11.0* 11.5*  HCT 39.4 39.7  PLT 504* 595*   Recent Labs  Lab 05/20/20 1353 05/21/20 0248  NA 141 141  K 3.7 4.0  CL 95* 94*  CO2 34* 38*  BUN 8 10  CREATININE 0.49 0.57  CALCIUM 9.0 9.2  PROT 8.1 8.0  BILITOT 0.4 0.4  ALKPHOS 95 99  ALT 17 19  AST 21 19  GLUCOSE 99 157*    Imaging/Diagnostic Tests: DG Chest Port 1 View  Result Date: 05/20/2020 CLINICAL DATA:  Shortness of breath EXAM: PORTABLE CHEST 1 VIEW COMPARISON:  March 12, 2020 FINDINGS: Lung volumes remain  low, diminished even when compared to the study of 03/12/2020. Accounting for low lung volumes cardiomediastinal contours are grossly stable but very limited assessment on today's study. Vascular congestion is cephalization of flow suggested bilaterally. LEFT hemidiaphragm is obscured. Patchy opacity in the LEFT chest could represent confluence of shadows or airspace disease. Airspace disease was seen in this area on the previous exam. On very limited assessment no acute skeletal process. IMPRESSION: 1. Low lung volumes with vascular congestion. 2. Basilar opacities and LEFT upper lobe opacities with similar appearance compared to previous chest imaging, a combination of volume loss and infection are considered. Electronically Signed   By: Zetta Bills M.D.   On: 05/20/2020 14:25     Gifford Shave, MD 05/21/2020, 7:22 AM PGY-2, [place] Intern pager: [phone], text pages welcome

## 2020-05-22 DIAGNOSIS — I10 Essential (primary) hypertension: Secondary | ICD-10-CM | POA: Diagnosis not present

## 2020-05-22 DIAGNOSIS — Z6841 Body Mass Index (BMI) 40.0 and over, adult: Secondary | ICD-10-CM | POA: Diagnosis not present

## 2020-05-22 DIAGNOSIS — R109 Unspecified abdominal pain: Secondary | ICD-10-CM | POA: Diagnosis not present

## 2020-05-22 DIAGNOSIS — I480 Paroxysmal atrial fibrillation: Secondary | ICD-10-CM | POA: Diagnosis present

## 2020-05-22 DIAGNOSIS — I672 Cerebral atherosclerosis: Secondary | ICD-10-CM | POA: Diagnosis not present

## 2020-05-22 DIAGNOSIS — J9622 Acute and chronic respiratory failure with hypercapnia: Secondary | ICD-10-CM | POA: Diagnosis present

## 2020-05-22 DIAGNOSIS — J9621 Acute and chronic respiratory failure with hypoxia: Secondary | ICD-10-CM | POA: Diagnosis present

## 2020-05-22 DIAGNOSIS — I517 Cardiomegaly: Secondary | ICD-10-CM | POA: Diagnosis not present

## 2020-05-22 DIAGNOSIS — R4182 Altered mental status, unspecified: Secondary | ICD-10-CM | POA: Diagnosis not present

## 2020-05-22 DIAGNOSIS — F332 Major depressive disorder, recurrent severe without psychotic features: Secondary | ICD-10-CM | POA: Diagnosis present

## 2020-05-22 DIAGNOSIS — Z882 Allergy status to sulfonamides status: Secondary | ICD-10-CM | POA: Diagnosis not present

## 2020-05-22 DIAGNOSIS — R401 Stupor: Secondary | ICD-10-CM | POA: Diagnosis not present

## 2020-05-22 DIAGNOSIS — D75839 Thrombocytosis, unspecified: Secondary | ICD-10-CM | POA: Diagnosis present

## 2020-05-22 DIAGNOSIS — I639 Cerebral infarction, unspecified: Secondary | ICD-10-CM | POA: Diagnosis not present

## 2020-05-22 DIAGNOSIS — K219 Gastro-esophageal reflux disease without esophagitis: Secondary | ICD-10-CM | POA: Diagnosis present

## 2020-05-22 DIAGNOSIS — Z23 Encounter for immunization: Secondary | ICD-10-CM | POA: Diagnosis not present

## 2020-05-22 DIAGNOSIS — R0602 Shortness of breath: Secondary | ICD-10-CM | POA: Diagnosis present

## 2020-05-22 DIAGNOSIS — Z87891 Personal history of nicotine dependence: Secondary | ICD-10-CM | POA: Diagnosis not present

## 2020-05-22 DIAGNOSIS — I6523 Occlusion and stenosis of bilateral carotid arteries: Secondary | ICD-10-CM | POA: Diagnosis not present

## 2020-05-22 DIAGNOSIS — I5033 Acute on chronic diastolic (congestive) heart failure: Secondary | ICD-10-CM | POA: Diagnosis present

## 2020-05-22 DIAGNOSIS — I4892 Unspecified atrial flutter: Secondary | ICD-10-CM | POA: Diagnosis not present

## 2020-05-22 DIAGNOSIS — G2581 Restless legs syndrome: Secondary | ICD-10-CM | POA: Diagnosis present

## 2020-05-22 DIAGNOSIS — E872 Acidosis: Secondary | ICD-10-CM | POA: Diagnosis present

## 2020-05-22 DIAGNOSIS — R0600 Dyspnea, unspecified: Secondary | ICD-10-CM | POA: Diagnosis not present

## 2020-05-22 DIAGNOSIS — J449 Chronic obstructive pulmonary disease, unspecified: Secondary | ICD-10-CM | POA: Diagnosis present

## 2020-05-22 DIAGNOSIS — E662 Morbid (severe) obesity with alveolar hypoventilation: Secondary | ICD-10-CM | POA: Diagnosis present

## 2020-05-22 DIAGNOSIS — E119 Type 2 diabetes mellitus without complications: Secondary | ICD-10-CM | POA: Diagnosis present

## 2020-05-22 DIAGNOSIS — Z66 Do not resuscitate: Secondary | ICD-10-CM | POA: Diagnosis present

## 2020-05-22 DIAGNOSIS — D649 Anemia, unspecified: Secondary | ICD-10-CM | POA: Diagnosis present

## 2020-05-22 DIAGNOSIS — Z825 Family history of asthma and other chronic lower respiratory diseases: Secondary | ICD-10-CM | POA: Diagnosis not present

## 2020-05-22 DIAGNOSIS — Z7901 Long term (current) use of anticoagulants: Secondary | ICD-10-CM | POA: Diagnosis not present

## 2020-05-22 DIAGNOSIS — R29818 Other symptoms and signs involving the nervous system: Secondary | ICD-10-CM | POA: Diagnosis not present

## 2020-05-22 DIAGNOSIS — I272 Pulmonary hypertension, unspecified: Secondary | ICD-10-CM | POA: Diagnosis present

## 2020-05-22 DIAGNOSIS — F419 Anxiety disorder, unspecified: Secondary | ICD-10-CM | POA: Diagnosis present

## 2020-05-22 DIAGNOSIS — Z888 Allergy status to other drugs, medicaments and biological substances status: Secondary | ICD-10-CM | POA: Diagnosis not present

## 2020-05-22 DIAGNOSIS — K5900 Constipation, unspecified: Secondary | ICD-10-CM | POA: Diagnosis not present

## 2020-05-22 DIAGNOSIS — Z20822 Contact with and (suspected) exposure to covid-19: Secondary | ICD-10-CM | POA: Diagnosis present

## 2020-05-22 DIAGNOSIS — R4781 Slurred speech: Secondary | ICD-10-CM | POA: Diagnosis not present

## 2020-05-22 LAB — CBC
HCT: 37.9 % (ref 36.0–46.0)
Hemoglobin: 10.7 g/dL — ABNORMAL LOW (ref 12.0–15.0)
MCH: 26.8 pg (ref 26.0–34.0)
MCHC: 28.2 g/dL — ABNORMAL LOW (ref 30.0–36.0)
MCV: 94.8 fL (ref 80.0–100.0)
Platelets: 518 10*3/uL — ABNORMAL HIGH (ref 150–400)
RBC: 4 MIL/uL (ref 3.87–5.11)
RDW: 16.2 % — ABNORMAL HIGH (ref 11.5–15.5)
WBC: 10.4 10*3/uL (ref 4.0–10.5)
nRBC: 0 % (ref 0.0–0.2)

## 2020-05-22 LAB — BASIC METABOLIC PANEL
Anion gap: 9 (ref 5–15)
BUN: 11 mg/dL (ref 8–23)
CO2: 38 mmol/L — ABNORMAL HIGH (ref 22–32)
Calcium: 8.9 mg/dL (ref 8.9–10.3)
Chloride: 93 mmol/L — ABNORMAL LOW (ref 98–111)
Creatinine, Ser: 0.55 mg/dL (ref 0.44–1.00)
GFR, Estimated: >60 mL/min (ref >60)
Glucose, Bld: 171 mg/dL — ABNORMAL HIGH (ref 70–99)
Potassium: 3.3 mmol/L — ABNORMAL LOW (ref 3.5–5.1)
Sodium: 140 mmol/L (ref 135–145)

## 2020-05-22 LAB — GLUCOSE, CAPILLARY
Glucose-Capillary: 158 mg/dL — ABNORMAL HIGH (ref 70–99)
Glucose-Capillary: 143 mg/dL — ABNORMAL HIGH (ref 70–99)

## 2020-05-22 MED ORDER — HYDROXYZINE PAMOATE 25 MG PO CAPS
25.0000 mg | ORAL_CAPSULE | Freq: Every day | ORAL | Status: DC | PRN
  Filled 2020-05-22: qty 1

## 2020-05-22 MED ORDER — HYDROXYZINE HCL 25 MG PO TABS
25.0000 mg | ORAL_TABLET | Freq: Every day | ORAL | Status: DC | PRN
  Administered 2020-05-23 – 2020-05-28 (×5): 25 mg via ORAL
  Filled 2020-05-22 (×5): qty 1

## 2020-05-22 MED ORDER — PREDNISONE 20 MG PO TABS
20.0000 mg | ORAL_TABLET | Freq: Every day | ORAL | Status: AC
  Administered 2020-05-23 – 2020-05-24 (×2): 20 mg via ORAL
  Filled 2020-05-22 (×3): qty 1

## 2020-05-22 MED ORDER — IBUPROFEN 200 MG PO TABS
400.0000 mg | ORAL_TABLET | Freq: Once | ORAL | Status: AC
  Administered 2020-05-22: 400 mg via ORAL
  Filled 2020-05-22: qty 2

## 2020-05-22 MED ORDER — IPRATROPIUM BROMIDE 0.06 % NA SOLN
2.0000 | Freq: Two times a day (BID) | NASAL | Status: DC
  Administered 2020-05-22 – 2020-05-29 (×14): 2 via NASAL
  Filled 2020-05-22: qty 30

## 2020-05-22 MED ORDER — METHYLPREDNISOLONE SODIUM SUCC 125 MG IJ SOLR
60.0000 mg | Freq: Once | INTRAMUSCULAR | Status: DC
  Administered 2020-05-22: 60 mg via INTRAVENOUS
  Filled 2020-05-22: qty 2

## 2020-05-22 MED ORDER — POTASSIUM CHLORIDE CRYS ER 20 MEQ PO TBCR
40.0000 meq | EXTENDED_RELEASE_TABLET | Freq: Once | ORAL | Status: AC
  Administered 2020-05-22: 40 meq via ORAL
  Filled 2020-05-22: qty 2

## 2020-05-22 NOTE — Progress Notes (Addendum)
FPTS Interim Progress Note  Went to speak to patient in regards to staff disposition as well as patient's concerns about Solu-Medrol. Patient reports that she has spoken to social work and is okay with placement to SNF facility as long as it is 3 stories or greater.  She understands that she is not a candidate for CIR given her other medical comorbidities. Patient reports she received Solu-Medrol this morning and her chest feels much less tight.  Explained to patient that her pulmonary function test findings do not show any obstructive process and it is unlikely that prednisone or other steroids would be helpful for her, though she is adamant that it improves her chest tightness.  She reports that the duo nebs and albuterol do not help the chest tightness by themselves, only 100s.  With the Solu-Medrol or steroids.  Explained to patient that she did not have any wheezing on exam, increased sputum production which are hallmarks of COPD asthma exacerbations.  She reports that in 2006 she was approved for disability in the setting of chronic obstructive asthma and asked why she was able to be approved for that if she did not actually have asthma or COPD.  Explained to patient that this was in 2006 and I do not have records to the EMR back then.  Patient is asking for Solu-Medrol as needed.  Explained risks and benefits of steroids to patient and she is agreeable to start prednisone 20 mg 3-day burst empirically given subjective improvement of chest tightness.  Patient should follow-up outpatient for PFTs as continued prednisone for her several acute respiratory failure exacerbations may cause more harm than benefit.  Patient agrees.  She reports that she did not know that she could "gain this much weight."  She reports that being on steroids all the time causes her to eat a lot and reports that this is the reason that she has gained so much weight in the last year. She reports many other issues since gaining so  much weight. She looks forward to going to SNF for rehab.  Wilber Oliphant, MD 05/22/2020, 5:12 PM PGY-3, [place] pager [phone]  Patient also complaining of sinus type headache not relieved with acetaminophen.  - ibuprofen 400 mg x 1.   Wilber Oliphant, M.D.  5:40 PM 05/22/2020

## 2020-05-22 NOTE — Progress Notes (Signed)
Nurse paged family medicine (9pm) after patient and RT had concerns that patient is very tight and diminished in bilateral lower lung lobes. RT recommended solu-medrol, patient requested this medicine as well. No response from physician after page. Patient is now upset about the "no response" and feels she is not being cared for appropriately. Nurse spoke with physician (3am) after second page, physican explained to nurse they will re-evaluate the need for solu-medrol in the morning with day team. Patient is currently wearing Bipap and very stable. O2 saturations have remained well over 90 % all night, patient still c/o of tightness in lungs. No acute events have occurred.

## 2020-05-22 NOTE — NC FL2 (Signed)
[place] LEVEL OF CARE SCREENING TOOL     IDENTIFICATION  Patient Name: [name] White Birthdate: Aug 19, 1955 Sex: female Admission Date (Current Location): 05/20/2020  [name] and [place] Number:  [name] [id number] [place] and Address:  [facility], [place] [address] [place], [place] [address]      Provider Number: [id number]  Attending Physician Name and Address:  Lenoria Chime, MD  Relative Name and Phone Number:  Alta Corning Niece [phone]    Current Level of Care: Hospital Recommended Level of Care: [place] Prior Approval Number:    Date Approved/Denied:   PASRR Number: [id number]  Discharge Plan: SNF    Current Diagnoses: Patient Active Problem List   Diagnosis Date Noted  . Post-nasal drip 05/19/2020  . Frequent falls 05/06/2020  . Acute exacerbation of CHF (congestive heart failure) ([place]) 03/12/2020  . Community acquired pneumonia   . Acute respiratory failure with hypoxia ([place]) 02/06/2020  . Dyspnea 11/24/2019  . Respiratory distress   . Respiratory failure, acute-on-chronic ([place]) 11/09/2019  . Seasonal allergies 10/03/2019  . Debility 08/02/2019  . Hypertension associated with diabetes ([place])   . Weight gain due to medication 06/05/2019  . Acute on chronic heart failure with preserved ejection fraction ([place]) 03/14/2019  . Acquired thrombophilia ([place]) due to A-Fib 03/07/2019  . Heart failure with preserved ejection fraction ([place]) 03/06/2019  . Obesity hypoventilation syndrome ([place]) 03/06/2019  . Lower GI bleed   . COPD exacerbation ([place]) 02/26/2019  . Urge incontinence 01/27/2019  . Shortness of breath 08/29/2018  . OSA (obstructive sleep apnea) 07/05/2018  . Respiratory failure ([place]) 07/05/2018  . Severe episode of recurrent major depressive disorder, without psychotic features ([place])   . MDD (major depressive disorder), recurrent episode, severe ([place]) 09/07/2015  . Atrial flutter  ([place]) 07/29/2015  . Asthma 11/12/2014  . History of hypertension 11/05/2014  . Paroxysmal atrial fibrillation ([place]) 11/03/2014  . Chronic anticoagulation 11/03/2014  . Major depressive disorder 11/03/2014  . History of pulmonary embolus (PE) 11/03/2014  . MDD (major depressive disorder), recurrent severe, without psychosis ([place]) 03/05/2014  . Hot flashes 04/22/2011  . OVERACTIVE BLADDER 04/18/2008  . Osteoarthritis of both knees 04/18/2008  . Osteoarthritis involving multiple joints on both sides of body 09/14/2007  . Morbid obesity ([place]) 08/24/2006  . RESTLESS LEGS SYNDROME 08/24/2006  . HYPERTENSION, BENIGN SYSTEMIC 08/24/2006  . RHINITIS, ALLERGIC 08/24/2006  . GASTROESOPHAGEAL REFLUX, NO ESOPHAGITIS 08/24/2006    Orientation RESPIRATION BLADDER Height & Weight     Self, Time, Situation, Place  O2 Continent Weight:   Height:  5\' 5"  (165.1 cm)  BEHAVIORAL SYMPTOMS/MOOD NEUROLOGICAL BOWEL NUTRITION STATUS      Continent Diet (Regular.  See discharge summary.)  AMBULATORY STATUS COMMUNICATION OF NEEDS Skin   Limited Assist Verbally Normal                       Personal Care Assistance Level of Assistance  Feeding Bathing Assistance: Limited assistance Feeding assistance: Independent Dressing Assistance: Maximum assistance     Functional Limitations Info  Sight, Hearing, Speech Sight Info: Adequate Hearing Info: Adequate Speech Info: Adequate    SPECIAL CARE FACTORS FREQUENCY  PT (By licensed PT), OT (By licensed OT)     PT Frequency: 5x week OT Frequency: 5x week            Contractures Contractures Info: Not present    Additional Factors Info  Code Status, Allergies Code Status Info: DNR  Allergies Info: Caffeine, Hydralazine Hcl, Hydrocodone, Ciprofloxacin, Erythromycin, Lisinopril, Oxycodone, Sulfamethoxazole-trimethoprim, Tape           Current Medications (05/22/2020):  This is the current hospital active medication list Current  Facility-Administered Medications  Medication Dose Route Frequency Provider Last Rate Last Admin  . acetaminophen (TYLENOL) tablet 650 mg  650 mg Oral Q6H PRN Delora Fuel, MD   650 mg at 05/22/20 0600  . albuterol (PROVENTIL) (2.5 MG/3ML) 0.083% nebulizer solution 3 mL  3 mL Inhalation Q6H PRN Wilber Oliphant, MD      . alum & mag hydroxide-simeth (MAALOX/MYLANTA) 200-200-20 MG/5ML suspension 30 mL  30 mL Oral Q6H PRN Lenoria Chime, MD   30 mL at 05/21/20 1821  . apixaban (ELIQUIS) tablet 5 mg  5 mg Oral BID Ezequiel Essex, MD   5 mg at 05/22/20 0835  . atorvastatin (LIPITOR) tablet 40 mg  40 mg Oral QPM Ezequiel Essex, MD   40 mg at 05/21/20 1648  . bisacodyl (DULCOLAX) EC tablet 5 mg  5 mg Oral Daily PRN Ezequiel Essex, MD      . diclofenac sodium (VOLTAREN) 1 % transdermal gel 2 g  2 g Topical QID PRN Ezequiel Essex, MD      . diltiazem (CARDIZEM CD) 24 hr capsule 120 mg  120 mg Oral Daily Wilber Oliphant, MD   120 mg at 05/22/20 0835  . flecainide (TAMBOCOR) tablet 100 mg  100 mg Oral BID Ezequiel Essex, MD   100 mg at 05/22/20 0834  . furosemide (LASIX) tablet 60 mg  60 mg Oral Daily Ezequiel Essex, MD   60 mg at 05/22/20 0834  . gabapentin (NEURONTIN) capsule 200 mg  200 mg Oral TID Ezequiel Essex, MD   200 mg at 05/22/20 4580  . hydrOXYzine (ATARAX/VISTARIL) tablet 25 mg  25 mg Oral Daily PRN Lenoria Chime, MD      . ipratropium (ATROVENT) 0.06 % nasal spray 2 spray  2 spray Each Nare BID Lenoria Chime, MD   2 spray at 05/22/20 1257  . ipratropium-albuterol (DUONEB) 0.5-2.5 (3) MG/3ML nebulizer solution 3 mL  3 mL Nebulization Q6H PRN Ezequiel Essex, MD   3 mL at 05/22/20 0551  . ipratropium-albuterol (DUONEB) 0.5-2.5 (3) MG/3ML nebulizer solution 3 mL  3 mL Nebulization TID Lenoria Chime, MD   3 mL at 05/22/20 0740  . ketotifen (ZADITOR) 0.025 % ophthalmic solution 1 drop  1 drop Both Eyes BID PRN Ezequiel Essex, MD   1 drop at 05/20/20 2201  . melatonin tablet 3 mg  3  mg Oral QHS PRN Ezequiel Essex, MD   3 mg at 05/21/20 2125  . metoprolol tartrate (LOPRESSOR) tablet 12.5 mg  12.5 mg Oral BID Ezequiel Essex, MD   12.5 mg at 05/22/20 0835  . pantoprazole (PROTONIX) EC tablet 40 mg  40 mg Oral Daily Ezequiel Essex, MD   40 mg at 05/22/20 0835  . polyethylene glycol (MIRALAX / GLYCOLAX) packet 17 g  17 g Oral Daily PRN Ezequiel Essex, MD      . venlafaxine [facility]) tablet 37.5 mg  37.5 mg Oral Q breakfast Ezequiel Essex, MD   37.5 mg at 05/22/20 0835  . venlafaxine XR (EFFEXOR-XR) 24 hr capsule 150 mg  150 mg Oral Q breakfast Ezequiel Essex, MD   150 mg at 05/22/20 9983     Discharge Medications: Please see discharge summary for a list of discharge medications.  Relevant Imaging Results:  Relevant Lab Results:  Additional Information SS#: [id number]  Joanne Chars, LCSW

## 2020-05-22 NOTE — TOC Initial Note (Signed)
Transition of Care [facility]) - Initial/Assessment Note    Patient Details  Name: [name] White MRN: [id number] Date of Birth: 27-Mar-1956  [facility]) CM/SW Contact:    Joanne Chars, LCSW Phone Number: 05/22/2020, 1:47 PM  Clinical Narrative:   CSW met with pt to discuss discharge plan.  Pt agreeable to SNF recommendation, has been to SNF before and does not want to go to [employer] or [employer].  Permission given to send out referral in hub and to speak with niece Jonelle Sidle.  Choice document given. Pt is vaccinated for covid.  Current equipment in home: walker, rollator, wheelchair, shower chair.                  Expected Discharge Plan: Skilled Nursing Facility Barriers to Discharge: Continued Medical Work up, SNF Pending bed offer   Patient Goals and CMS Choice Patient states their goals for this hospitalization and ongoing recovery are:: "get back to walking, taking care of myself' CMS Medicare.gov Compare Post Acute Care list provided to:: Patient Choice offered to / list presented to : Patient  Expected Discharge Plan and Services Expected Discharge Plan: [place] Choice: [place] Living arrangements for the past 2 months: Apartment                                      Prior Living Arrangements/Services Living arrangements for the past 2 months: Apartment Lives with:: Relatives Patient language and need for interpreter reviewed:: Yes Do you feel safe going back to the place where you live?: Yes      Need for Family Participation in Patient Care: No (Comment) Care giver support system in place?: Yes (comment) Current home services: Other (comment) (none) Criminal Activity/Legal Involvement Pertinent to Current Situation/Hospitalization: No - Comment as needed  Activities of Daily Living      Permission Sought/Granted Permission sought to share information with : Family Supports,  Chartered certified accountant granted to share information with : Yes, Verbal Permission Granted  Share Information with NAME: niece, Jonelle Sidle  Permission granted to share info w AGENCY: SNF        Emotional Assessment Appearance:: Appears stated age Attitude/Demeanor/Rapport: Engaged Affect (typically observed): Appropriate, Pleasant Orientation: : Oriented to Self, Oriented to Place, Oriented to  Time, Oriented to Situation Alcohol / Substance Use: Not Applicable Psych Involvement: No (comment)  Admission diagnosis:  Shortness of breath [R06.02] Dyspnea, unspecified type [R06.00] Patient Active Problem List   Diagnosis Date Noted  . Post-nasal drip 05/19/2020  . Frequent falls 05/06/2020  . Acute exacerbation of CHF (congestive heart failure) ([place]) 03/12/2020  . Community acquired pneumonia   . Acute respiratory failure with hypoxia ([place]) 02/06/2020  . Dyspnea 11/24/2019  . Respiratory distress   . Respiratory failure, acute-on-chronic ([place]) 11/09/2019  . Seasonal allergies 10/03/2019  . Debility 08/02/2019  . Hypertension associated with diabetes ([place])   . Weight gain due to medication 06/05/2019  . Acute on chronic heart failure with preserved ejection fraction ([place]) 03/14/2019  . Acquired thrombophilia ([place]) due to A-Fib 03/07/2019  . Heart failure with preserved ejection fraction ([place]) 03/06/2019  . Obesity hypoventilation syndrome ([place]) 03/06/2019  . Lower GI bleed   . COPD exacerbation ([place]) 02/26/2019  . Urge incontinence 01/27/2019  . Shortness of breath 08/29/2018  . OSA (obstructive sleep apnea) 07/05/2018  .  Respiratory failure ([place]) 07/05/2018  . Severe episode of recurrent major depressive disorder, without psychotic features ([place])   . MDD (major depressive disorder), recurrent episode, severe ([place]) 09/07/2015  . Atrial flutter ([place]) 07/29/2015  . Asthma 11/12/2014  . History of hypertension 11/05/2014  . Paroxysmal atrial fibrillation ([place])  11/03/2014  . Chronic anticoagulation 11/03/2014  . Major depressive disorder 11/03/2014  . History of pulmonary embolus (PE) 11/03/2014  . MDD (major depressive disorder), recurrent severe, without psychosis ([place]) 03/05/2014  . Hot flashes 04/22/2011  . OVERACTIVE BLADDER 04/18/2008  . Osteoarthritis of both knees 04/18/2008  . Osteoarthritis involving multiple joints on both sides of body 09/14/2007  . Morbid obesity ([place]) 08/24/2006  . RESTLESS LEGS SYNDROME 08/24/2006  . HYPERTENSION, BENIGN SYSTEMIC 08/24/2006  . RHINITIS, ALLERGIC 08/24/2006  . GASTROESOPHAGEAL REFLUX, NO ESOPHAGITIS 08/24/2006   PCP:  Richarda Osmond, DO Pharmacy:   [facility], [address] [place] [address] Phone: [phone] Fax: [phone]     Social Determinants of Health (SDOH) Interventions    Readmission Risk Interventions Readmission Risk Prevention Plan 02/07/2020 07/22/2019 03/08/2019  Transportation Screening Complete Complete Complete  Medication Review Press photographer) Complete Complete Complete  PCP or Specialist appointment within 3-5 days of discharge Complete Complete Complete  HRI or Home Care Consult Complete Complete Complete  SW Recovery Care/Counseling Consult Complete Complete Complete  Palliative Care Screening Complete Not Applicable Not Applicable  Skilled Nursing Facility Complete Not Applicable Not Applicable  Some recent data might be hidden

## 2020-05-22 NOTE — Progress Notes (Addendum)
Family Medicine Teaching Service Daily Progress Note Intern Pager: [phone]  Patient name: [name] White Medical record number: [id number] Date of birth: 10/29/1955 Age: 64 y.o. Gender: female  Primary Care Provider: Richarda Osmond, DO Consultants: None Code Status: DNR   Pt Overview and Major Events to Date:  11/24-patient admitted with acute on chronic hypoxic respiratory failure   Assessment and Plan: [name] White is a 64 y.o. female presenting with 2-week progressive history of dyspnea, chest tightness, and increased oxygen requirement. PMH is significant for OHS OSA, HFpEF, HTN, paroxysmal A. fib on Eliquis, T2DM, MDD, GERD, osteoarthritis, morbid obesity, remote tobacco use with 20-pack-year cigarette history.   Acute on chronic hypoxic/hypercapnic respiratory failure 2/2 OHS in setting of diastolic HF Patient reports she has had 2 weeks of worsening shortness of breath.  Patient came to the emergency department and in route received Solu-Medrol and magnesium.  CT showed no evidence of PE or dissection. Dilated central pulmonary arteries suggesting pulmonary arterial hypertension and Stable posterior dependent atelectasis. PT recommending SNF placement.  Patient states respiratory therapist advised Solu-Medrol at night but nobody gave her. She states she has difficulty in breathing and that will only get better with Solu-Medrol. Patient is sating at 98% with 7 L of O2.  Patient uses 5 to 6 L of oxygen at home. Pt states PT did try to sat her on the chair.  PT recommended SNF placement.  Patient states she is okay to go to rehab. CSW working on SNF placement.  -Respiratory therapy consulted, appreciate their care with the patient -BiPAP as needed. -CPAP nightly. -Continue Eliquis 5 mg twice daily. -Continue PT/OT. -Maintain O2 sats above 88% with goal being home 5-6 L O2 -Start ipratropium nasal spray 2 times daily -SNF placement at D/c.  OHS with suspected OSA Home O2  level of 5 to 6 L.  Currently on 7L with SPO2 of 98%.  Home meds include albuterol inhaler and DuoNebs as needed as well as Trelegy and home O2. -Continue home medications -Monitor respiratory status -Maintain O2 to home levels of 5-6 L with O2 goal of 88 to 92% -Incentive spirometry every 4 hours -Ambulate every shift   HFpEF  Hypertension Home medications include atorvastatin, Cardizem, metoprolol. Initially hypertensive in the ED, but once admitted normotensive. Last BP 109/62 mmHg with pulse 92/min. -Continue home meds -Decline to consult cardiology at this time, may in future if clinically indicated   Asymmetric BLE edema Noted on admission physical exam, left leg more swollen than the right. Left leg  1+ pitting edema to the level of knee, right leg mild pitting edema.  No erythema.  Bilateral pulses +nt.  Tenderness  present.  patient notes that this asymmetry is normal for her but the amount of swelling is increased from her baseline.  Had Doppler studies which were negative for clots. -Continue to monitor -Ambulate patient -Activity as tolerated -Elevate legs   Paroxysmal A. fib on Eliquis At home takes Eliquis 5 mg twice daily, Cardizem, flecainide. Patient noted to be in sinus tachycardia upon admission. -Continuous telemetry 24 to 48 hours -Continue home medications   OSA Wears CPAP at night.  Will provide here while inpatient.   Type 2 diabetes mellitus BG today is 157. Glucose at admission 99.  No current home meds, appears to be controlled by diet.  Hemoglobin A1c 11/24 was 6.4 -Monitor BMPs daily.  Thrombocytosis, chronic  Today Platelets 595.Platelets 504 on admission.  -Continue to Monitor   Normocytic anemia:  Chronic Hemoglobin 11.5, MCV 94.7     MDD: Chronic, stable Home meds include venlafaxine total dose 187.5 mg daily. -Continue home med.   Anxiety -Continue home hydroxyzine  GERD: Chronic, stable Home meds include omeprazole 20 mg  daily. -Switch to Protonix while inpatient   Osteoarthritis: Chronic, stable Home meds include gabapentin 200 mg 3 times daily. -Continue home med   FEN/GI:Regular diet PPx: On Eliquis  Disposition: Telemetry observation SNF at discharge Subjective:  Patient states respiratory therapist advised Solu-Medrol at night but nobody gave her. She states she has difficulty in breathing and that will only get better with Solu-Medrol.  She denies any chest pain but endorses chest tightness.  Patient is sating at 98% with 7 L of O2.  Patient uses 5 to 6 L of oxygen at home. Pt states he did try to set her on the chair. Denies any headache, nausea, vomiting, abdominal pain, and constipation.  Objective: Temp:  [97.8 F (36.6 C)] 97.8 F (36.6 C) (11/25 2000) Pulse Rate:  [92-98] 98 (11/26 0741) Resp:  [20-22] 20 (11/26 0741) BP: (109-112)/(57-62) 109/62 (11/25 2000) SpO2:  [97 %-98 %] 98 % (11/26 0741) Physical Exam: General: Resting in bed comfortably, not in acute distress Cardiovascular: S1, S2 normal, RRR Respiratory: Satting at 96% at 7 L of oxygen, no wheezing Rales and rhonchi Abdomen: Soft, nontender Extremities: 1+ pitting edema in lower extremities, more in left leg as compared to right. Painful on pressure. Laboratory: Recent Labs  Lab 05/20/20 1353 05/21/20 0248  WBC 9.7 10.1  HGB 11.0* 11.5*  HCT 39.4 39.7  PLT 504* 595*   Recent Labs  Lab 05/20/20 1353 05/21/20 0248  NA 141 141  K 3.7 4.0  CL 95* 94*  CO2 34* 38*  BUN 8 10  CREATININE 0.49 0.57  CALCIUM 9.0 9.2  PROT 8.1 8.0  BILITOT 0.4 0.4  ALKPHOS 95 99  ALT 17 19  AST 21 19  GLUCOSE 99 157*      Imaging/Diagnostic Tests: CT ANGIO CHEST PE W OR WO CONTRAST  Result Date: 05/21/2020 CLINICAL DATA:  Chest pain, shortness of breath worsening x4 weeks, COPD, CHF, history of PE EXAM: CT ANGIOGRAPHY CHEST WITH CONTRAST TECHNIQUE: Multidetector CT imaging of the chest was performed using the standard  protocol during bolus administration of intravenous contrast. Multiplanar CT image reconstructions and MIPs were obtained to evaluate the vascular anatomy. CONTRAST:  14mL OMNIPAQUE IOHEXOL 350 MG/ML SOLN COMPARISON:  02/06/2020 and previous FINDINGS: Cardiovascular: Mild four-chamber cardiac enlargement. No pericardial effusion. Dilated central pulmonary arteries. Fair contrast opacification of pulmonary artery branches. No convincing filling defects to suggest acute PE. Adequate contrast opacification of the thoracic aorta with no evidence of dissection, aneurysm, or stenosis. There is classic 3-vessel brachiocephalic arch anatomy without proximal stenosis. Minimal atheromatous plaque in the proximal descending segment. Mediastinum/Nodes: No mass or adenopathy. Lungs/Pleura: No pleural effusion. No pneumothorax. Elevated right hemidiaphragm. Posterior dependent atelectasis/consolidation right greater than left, stable since previous. No new infiltrate or discrete nodule. Upper Abdomen: No acute findings. Musculoskeletal: Multilevel spondylitic changes in the visualized lower cervical and thoracic spine. No fracture or worrisome bone lesion. Review of the MIP images confirms the above findings. IMPRESSION: 1. Negative for acute PE or thoracic aortic dissection. 2. Dilated central pulmonary arteries suggesting pulmonary arterial hypertension. 3. Stable posterior dependent atelectasis/consolidation right greater than left. Aortic Atherosclerosis (ICD10-I70.0). Electronically Signed   By: Lucrezia Europe M.D.   On: 05/21/2020 11:00   DG Chest [facility]  Result Date: 05/20/2020 CLINICAL DATA:  Shortness of breath EXAM: PORTABLE CHEST 1 VIEW COMPARISON:  March 12, 2020 FINDINGS: Lung volumes remain low, diminished even when compared to the study of 03/12/2020. Accounting for low lung volumes cardiomediastinal contours are grossly stable but very limited assessment on today's study. Vascular congestion is  cephalization of flow suggested bilaterally. LEFT hemidiaphragm is obscured. Patchy opacity in the LEFT chest could represent confluence of shadows or airspace disease. Airspace disease was seen in this area on the previous exam. On very limited assessment no acute skeletal process. IMPRESSION: 1. Low lung volumes with vascular congestion. 2. Basilar opacities and LEFT upper lobe opacities with similar appearance compared to previous chest imaging, a combination of volume loss and infection are considered. Electronically Signed   By: Zetta Bills M.D.   On: 05/20/2020 14:25    Armando Reichert, MD 05/22/2020, 8:14 AM PGY-1, [facility] Intern pager: [phone], text pages welcome

## 2020-05-23 DIAGNOSIS — R0602 Shortness of breath: Secondary | ICD-10-CM | POA: Diagnosis not present

## 2020-05-23 LAB — BASIC METABOLIC PANEL
Anion gap: 10 (ref 5–15)
BUN: 10 mg/dL (ref 8–23)
CO2: 40 mmol/L — ABNORMAL HIGH (ref 22–32)
Calcium: 8.9 mg/dL (ref 8.9–10.3)
Chloride: 90 mmol/L — ABNORMAL LOW (ref 98–111)
Creatinine, Ser: 0.62 mg/dL (ref 0.44–1.00)
GFR, Estimated: >60 mL/min (ref >60)
Glucose, Bld: 167 mg/dL — ABNORMAL HIGH (ref 70–99)
Potassium: 3.9 mmol/L (ref 3.5–5.1)
Sodium: 140 mmol/L (ref 135–145)

## 2020-05-23 LAB — GLUCOSE, CAPILLARY
Glucose-Capillary: 109 mg/dL — ABNORMAL HIGH (ref 70–99)
Glucose-Capillary: 95 mg/dL (ref 70–99)
Glucose-Capillary: 114 mg/dL — ABNORMAL HIGH (ref 70–99)
Glucose-Capillary: 214 mg/dL — ABNORMAL HIGH (ref 70–99)

## 2020-05-23 MED ORDER — ASPIRIN-ACETAMINOPHEN-CAFFEINE 250-250-65 MG PO TABS
1.0000 | ORAL_TABLET | Freq: Three times a day (TID) | ORAL | Status: DC | PRN
  Administered 2020-05-23 – 2020-05-24 (×3): 1 via ORAL
  Filled 2020-05-23 (×4): qty 1

## 2020-05-23 MED ORDER — FLUTICASONE PROPIONATE 50 MCG/ACT NA SUSP
1.0000 | Freq: Every day | NASAL | Status: DC
  Administered 2020-05-23 – 2020-05-29 (×6): 1 via NASAL
  Filled 2020-05-23: qty 16

## 2020-05-23 MED ORDER — ONDANSETRON HCL 4 MG PO TABS
4.0000 mg | ORAL_TABLET | Freq: Three times a day (TID) | ORAL | Status: DC | PRN
  Administered 2020-05-23 (×2): 4 mg via ORAL
  Filled 2020-05-23 (×2): qty 1

## 2020-05-23 MED ORDER — KETOROLAC TROMETHAMINE 30 MG/ML IJ SOLN
30.0000 mg | Freq: Once | INTRAMUSCULAR | Status: AC
  Administered 2020-05-23: 30 mg via INTRAVENOUS
  Filled 2020-05-23: qty 1

## 2020-05-23 NOTE — Plan of Care (Signed)

## 2020-05-23 NOTE — Progress Notes (Signed)
Family Medicine Teaching Service Daily Progress Note Intern Pager: [phone]  Patient name: [name] White Medical record number: [id number] Date of birth: December 06, 1955 Age: 64 y.o. Gender: female  Primary Care Provider: Richarda Osmond, DO Consultants: None Code Status: Full  Pt Overview and Major Events to Date:  Admitted 11/24  Assessment and Plan: [name] White a 64 y.o.femalepresenting with2-week progressive history of dyspnea, chest tightness, and increased oxygen requirement. PMH is significant forOHS, pulmonary hypertension, OSA, HFpEF, HTN, paroxysmal A. fib on Eliquis, T2DM,MDD, GERD, osteoarthritis, morbid obesity,remote tobacco use with 20-pack-year cigarette history.  Acute on chronic hypoxic/hypercapnic respiratory failure 2/2 OHS in setting of diastolic HF Patient reports breathing has improved. Complains of postnasal drip and a headache, requests Excedrin Migraine and Zyrtec. Verbal orders given to nurse to administer. Was on 7 L nasal cannula when I came to see her this morning, did well reduced to 5 L nasal cannula (home baseline 5 to 6 L) while ordering her breakfast. SPO2 > remained 94% while speaking, dipped down to 91% while repositioning herself in bed with quick recovery to 95%. Medically stable; plan for discharge to SNF as soon as bed placement available. -BiPAP as needed -CPAP nightly -Continue PT/OT -Maintain O2 sats above 94% with goal being home 5-6 L O2 -Start ipratropium nasal spray 2 times daily as recently started by PCP -SNF placement at D/c.  OHS  OSA Home O2 level of 5 to 6 L. Home meds include albuterol inhaler and DuoNebs as needed as well as Trelegy and home O2. -Ambulate with pulse ox today -Continue CPAP overnight -Continue home medications -Monitor respiratory status -MaintainO2 to home levels of 5-6 L with O2 goal of 88 to 92% -Incentive spirometry every 4 hours -Ambulate every shift  HFpEFHypertension: Chronic,  stable Home medications includeatorvastatin, Cardizem, metoprolol.Initially hypertensive in the ED, but once admitted normotensive. Last BP 109/62 mmHg with pulse 92/min. -Continue home meds -Declineto consult cardiology at this time,may in future if clinically indicated  Asymmetric BLE edema: Chronic, stable Noted on admission physical exam, L>R. Given tachypnea and tachycardia, obtained CTA PE chest; negative for PE.  -Continue to monitor -Ambulate patient -Activity as tolerated -Elevate legs  Paroxysmal A. fib on Eliquis: Chronic, stable At home takesEliquis 5 mg twice daily, Cardizem, flecainide. -Continuous telemetry 24 to 48 hours -Continue home medications  Type 2 diabetes mellitus BG today is 109. Glucose at admission 99.No current home meds, appears to be controlled by diet.Hemoglobin A1c 11/24 was 6.4 -Monitor BMPs daily.  Thrombocytosis, chronic Last check yesterday, PLT 518. Platelets 504 on admission. -Continue to Monitor   Normocytic anemia: Chronic Last check yesterday, hemoglobin 10.7 with MCV and a 4.8.  MDD: Chronic, stable Home meds includevenlafaxine total dose 187.5 mg daily. -Continue home med.  Anxiety -Continue home hydroxyzine  GERD: Chronic, stable Home meds includeomeprazole 20 mg daily. -Protonix while inpatient  Osteoarthritis: Chronic, stable Home meds includegabapentin 200 mg 3 times daily. -Continue home med  FEN/GI:Regular diet PPx:On Eliquis   Status is: Inpatient  Remains inpatient appropriate because:Unsafe d/c plan   Dispo:  Patient From: [place]  Planned Disposition: [facility]  Expected discharge date: 05/26/20  Medically stable for discharge: No     Subjective:  Patient found sitting in bed this morning awake, watching TV, and on the phone to order breakfast. She appears much brighter this morning and is in good spirits. She reports her breathing feels better.  Complains of continued postnasal drip and headache. Requests Excedrin  Migraine and Zyrtec. Noticed SPO2 98% on 8 L O2 via [place]. While patient answered her phone to order breakfast in the cafeteria, successfully turned down her O2 to 5 L. SPO2 remained >95% while speaking. Dipped down to 91% while repositioning herself in bed, quick recovery back up to 94-95%. Now at home baseline oxygen requirement of 5 to 6 L.  Objective: Temp:  [97.7 F (36.5 C)-98.7 F (37.1 C)] 97.7 F (36.5 C) (11/27 0507) Pulse Rate:  [91-107] 95 (11/27 0742) Resp:  [18-20] 18 (11/27 0742) BP: (132-143)/(68-73) 143/73 (11/27 0507) SpO2:  [95 %-98 %] 96 % (11/27 0742) Weight:  [162.3 kg] 162.3 kg (11/27 0507) Physical Exam: General: Awake, alert, morbidly obese female in no acute distress Cardiovascular: Elevated heart rate with pulse around 96, S1 and S2 auscultated, no murmurs appreciated Respiratory: Clear to auscultation bilaterally Extremities: No BLE edema  Laboratory: Recent Labs  Lab 05/20/20 1353 05/21/20 0248 05/22/20 0937  WBC 9.7 10.1 10.4  HGB 11.0* 11.5* 10.7*  HCT 39.4 39.7 37.9  PLT 504* 595* 518*   Recent Labs  Lab 05/20/20 1353 05/20/20 1353 05/21/20 0248 05/22/20 0937 05/23/20 0212  NA 141   < > 141 140 140  K 3.7   < > 4.0 3.3* 3.9  CL 95*   < > 94* 93* 90*  CO2 34*   < > 38* 38* 40*  BUN 8   < > 10 11 10   CREATININE 0.49   < > 0.57 0.55 0.62  CALCIUM 9.0   < > 9.2 8.9 8.9  PROT 8.1  --  8.0  --   --   BILITOT 0.4  --  0.4  --   --   ALKPHOS 95  --  99  --   --   ALT 17  --  19  --   --   AST 21  --  19  --   --   GLUCOSE 99   < > 157* 171* 167*   < > = values in this interval not displayed.    Imaging/Diagnostic Tests: PORTABLE CHEST 1 VIEW 05/20/2020  COMPARISON:  March 12, 2020  FINDINGS: Lung volumes remain low, diminished even when compared to the study of 03/12/2020. Accounting for low lung volumes cardiomediastinal contours are grossly stable but very  limited assessment on today's study.  Vascular congestion is cephalization of flow suggested bilaterally.  LEFT hemidiaphragm is obscured.  Patchy opacity in the LEFT chest could represent confluence of shadows or airspace disease. Airspace disease was seen in this area on the previous exam.  On very limited assessment no acute skeletal process.  IMPRESSION: 1. Low lung volumes with vascular congestion. 2. Basilar opacities and LEFT upper lobe opacities with similar appearance compared to previous chest imaging, a combination of volume loss and infection are considered.  CT ANGIOGRAPHY CHEST WITH CONTRAST 05/21/2020  TECHNIQUE: Multidetector CT imaging of the chest was performed using the standard protocol during bolus administration of intravenous contrast. Multiplanar CT image reconstructions and MIPs were obtained to evaluate the vascular anatomy.  CONTRAST:  137mL OMNIPAQUE IOHEXOL 350 MG/ML SOLN  COMPARISON:  02/06/2020 and previous  FINDINGS: Cardiovascular: Mild four-chamber cardiac enlargement. No pericardial effusion. Dilated central pulmonary arteries. Fair contrast opacification of pulmonary artery branches. No convincing filling defects to suggest acute PE. Adequate contrast opacification of the thoracic aorta with no evidence of dissection, aneurysm, or stenosis. There is classic 3-vessel brachiocephalic arch anatomy without proximal stenosis. Minimal atheromatous plaque in the  proximal descending segment.  Mediastinum/Nodes: No mass or adenopathy.  Lungs/Pleura: No pleural effusion. No pneumothorax. Elevated right hemidiaphragm. Posterior dependent atelectasis/consolidation right greater than left, stable since previous. No new infiltrate or discrete nodule.  Upper Abdomen: No acute findings.  Musculoskeletal: Multilevel spondylitic changes in the visualized lower cervical and thoracic spine. No fracture or worrisome  bone lesion.  Review of the MIP images confirms the above findings.  IMPRESSION: 1. Negative for acute PE or thoracic aortic dissection. 2. Dilated central pulmonary arteries suggesting pulmonary arterial hypertension. 3. Stable posterior dependent atelectasis/consolidation right greater than left.   Ezequiel Essex, MD 05/23/2020, 9:13 AM PGY-1, [facility] Intern pager: [phone], text pages welcome

## 2020-05-23 NOTE — Hospital Course (Addendum)
Pt Overview and Major Events to Date:  Admitted 11/24   Assessment and Plan: [name] White is a 64 y.o. female presenting with 2-week progressive history of dyspnea, chest tightness, and increased oxygen requirement. PMH is significant for OHS, pulmonary hypertension, OSA, HFpEF, HTN, paroxysmal A. fib on Eliquis, T2DM, MDD, GERD, osteoarthritis, morbid obesity, remote tobacco use with 20-pack-year cigarette history.   Acute on chronic hypoxic/hypercapnic respiratory failure 2/2 OHS  Patient arrived to the ED via EMS, she called over concern for worsening dyspnea.  In transport, received CPAP, Solu-Medrol, magnesium.  SPO2 in ED on nonrebreather in the 70s. S/p Lasix 60 mg IV once and Tylenol in the ED.  After admission to the floor, SpO2 > 96% on 10 L via [facility]NC. Patient is on 5-6L at home at baseline. Admission BNP 80.0, troponin 9.  This is her typical presentation and has had multiple similar admissions in the past. Patient weaned down to home levels of O2 (5-6L) by 11/27. On 11/26, patient insisted on solu-medrol prn, stating is was the only thing that has helped her with the chest tightness in the past. After extensive discussion on risks and benefits, including the fact that no diagnostic pulmonary testing this far has shown her to have either COPD or asthma, patient agreed to prednisone 20 mg 3-day burst. Patient progressed well until acute event afternoon of 11/28.  Patient was stable and back to baseline that morning for prerounds, plan was to discharge to SNF as soon as bed placement available.  In the early afternoon, patient was found by rounding attending to have altered mental status with broken speech.  Code stroke was called, emergent CT scan negative and ABG demonstrated marked respiratory acidosis with pH of 7.17 and pCO2 >120.  Patient was placed on BiPAP with subsequently improving ABGs.  Patient stayed on BiPAP for approximately 36 hours, was weaned to home O2 of 6 L via nasal cannula  the morning of 11/30.  Subsequent MRI of her head, while limited by patient tolerance, was found to be negative.  This event is believed to be caused by inconsistent BiPAP use for the previous four nights while inpatient leading to CO2 retainment above her baseline retainment due to OHS. On day of discharge, patient was breathing comfortably on 5L O2 with SpO2 98%. Discharged to SNF with plan for PCP follow up.    OHS  pulmonary hypertension  OSA Home O2 level of 5 to 6 L. Home meds include albuterol inhaler and DuoNebs as needed as well as Trelegy and home O2. No changes to medication at this time. Discharged to SNF as above. Plan to resume home medications upon discharge. No new medications.

## 2020-05-24 ENCOUNTER — Inpatient Hospital Stay (HOSPITAL_COMMUNITY): Payer: Medicare HMO

## 2020-05-24 DIAGNOSIS — Z7901 Long term (current) use of anticoagulants: Secondary | ICD-10-CM

## 2020-05-24 DIAGNOSIS — R4182 Altered mental status, unspecified: Secondary | ICD-10-CM

## 2020-05-24 DIAGNOSIS — I639 Cerebral infarction, unspecified: Secondary | ICD-10-CM | POA: Diagnosis not present

## 2020-05-24 LAB — BLOOD GAS, ARTERIAL
Acid-Base Excess: 19.2 mmol/L — ABNORMAL HIGH (ref 0.0–2.0)
Acid-Base Excess: 23.3 mmol/L — ABNORMAL HIGH (ref 0.0–2.0)
Acid-Base Excess: 20.4 mmol/L — ABNORMAL HIGH (ref 0.0–2.0)
Bicarbonate: 47.9 mmol/L — ABNORMAL HIGH (ref 20.0–28.0)
Bicarbonate: 52 mmol/L — ABNORMAL HIGH (ref 20.0–28.0)
Bicarbonate: 48.8 mmol/L — ABNORMAL HIGH (ref 20.0–28.0)
Drawn by: 331001
FIO2: 44
FIO2: 100
FIO2: 80
O2 Saturation: 97.1 %
O2 Saturation: 98.2 %
O2 Saturation: 97.1 %
Patient temperature: 36.8
Patient temperature: 36.8
Patient temperature: 36.6
pCO2 arterial: >120 mmHg (ref 32.0–48.0)
pCO2 arterial: >120 mmHg (ref 32.0–48.0)
pCO2 arterial: >120 mmHg (ref 32.0–48.0)
pH, Arterial: 7.174 — CL (ref 7.350–7.450)
pH, Arterial: 7.204 — ABNORMAL LOW (ref 7.350–7.450)
pH, Arterial: 7.221 — ABNORMAL LOW (ref 7.350–7.450)
pO2, Arterial: 104 mmHg (ref 83.0–108.0)
pO2, Arterial: 170 mmHg — ABNORMAL HIGH (ref 83.0–108.0)
pO2, Arterial: 113 mmHg — ABNORMAL HIGH (ref 83.0–108.0)

## 2020-05-24 LAB — BASIC METABOLIC PANEL
Anion gap: 9 (ref 5–15)
BUN: 12 mg/dL (ref 8–23)
CO2: 43 mmol/L — ABNORMAL HIGH (ref 22–32)
Calcium: 8.6 mg/dL — ABNORMAL LOW (ref 8.9–10.3)
Chloride: 88 mmol/L — ABNORMAL LOW (ref 98–111)
Creatinine, Ser: 0.61 mg/dL (ref 0.44–1.00)
GFR, Estimated: >60 mL/min (ref >60)
Glucose, Bld: 132 mg/dL — ABNORMAL HIGH (ref 70–99)
Potassium: 4.3 mmol/L (ref 3.5–5.1)
Sodium: 140 mmol/L (ref 135–145)

## 2020-05-24 LAB — GLUCOSE, CAPILLARY
Glucose-Capillary: 105 mg/dL — ABNORMAL HIGH (ref 70–99)
Glucose-Capillary: 150 mg/dL — ABNORMAL HIGH (ref 70–99)
Glucose-Capillary: 147 mg/dL — ABNORMAL HIGH (ref 70–99)
Glucose-Capillary: 138 mg/dL — ABNORMAL HIGH (ref 70–99)

## 2020-05-24 LAB — TROPONIN I (HIGH SENSITIVITY): Troponin I (High Sensitivity): 19 ng/L — ABNORMAL HIGH (ref <18)

## 2020-05-24 LAB — BRAIN NATRIURETIC PEPTIDE: B Natriuretic Peptide: 117.8 pg/mL — ABNORMAL HIGH (ref 0.0–100.0)

## 2020-05-24 MED ORDER — ACETAMINOPHEN 325 MG PO TABS: 650.0000 mg | ORAL_TABLET | Freq: Four times a day (QID) | ORAL | Status: DC | PRN

## 2020-05-24 MED ORDER — KETOROLAC TROMETHAMINE 30 MG/ML IJ SOLN
30.0000 mg | Freq: Once | INTRAMUSCULAR | Status: AC
  Administered 2020-05-24: 30 mg via INTRAVENOUS
  Filled 2020-05-24: qty 1

## 2020-05-24 MED ORDER — AMOXICILLIN-POT CLAVULANATE 875-125 MG PO TABS: 1.0000 | ORAL_TABLET | Freq: Two times a day (BID) | ORAL | Status: DC

## 2020-05-24 MED ORDER — AMOXICILLIN 500 MG PO CAPS
500.0000 mg | ORAL_CAPSULE | Freq: Three times a day (TID) | ORAL | Status: DC
  Administered 2020-05-24: 500 mg via ORAL
  Filled 2020-05-24 (×3): qty 1

## 2020-05-24 MED ORDER — IOHEXOL 350 MG/ML SOLN
75.0000 mL | Freq: Once | INTRAVENOUS | Status: AC | PRN
  Administered 2020-05-24: 75 mL via INTRAVENOUS

## 2020-05-24 NOTE — Progress Notes (Addendum)
Family Medicine Teaching Service Daily Progress Note Intern Pager: [phone]  Patient name: [name] White Medical record number: [id number] Date of birth: Feb 08, 1956 Age: 64 y.o. Gender: female  Primary Care Provider: Richarda Osmond, DO Consultants: none Code Status: full  Pt Overview and Major Events to Date:  Admitted 11/24  Assessment and Plan: [name] White a 64 y.o.femalepresenting with2-week progressive history of dyspnea, chest tightness, and increased oxygen requirement. PMH is significant forOHS, pulmonary hypertension, OSA, HFpEF, HTN, paroxysmal A. fib on Eliquis, T2DM,MDD, GERD, osteoarthritis, morbid obesity,remote tobacco use with 20-pack-year cigarette history.  Acute on chronic hypoxic/hypercapnic respiratory failure 2/2 OHS in setting of diastolic HF Patient reports breathing has improved. Complains of postnasal drip and a headache, requests Excedrin Migraine and Zyrtec. Verbal orders given to nurse to administer. Was on 7 L nasal cannula when I came to see her this morning, did well reduced to 5 L nasal cannula (home baseline 5 to 6 L) while ordering her breakfast. SPO2 > remained 94% while speaking, dipped down to 91% while repositioning herself in bed with quick recovery to 95%. Medically stable; plan for discharge to SNF as soon as bed placement available. -BiPAP as needed -CPAP nightly -ContinuePT/OT -Maintain O2 sats 88-92% with goal being home 5-6 L O2 -Start ipratropium nasal spray 2 times daily as recently started by PCP -SNF placement at D/c.  Headache Pt complains of frontal headache lasting several weeks.  States she has a hx of migraines but this is worse than her usual migraine pain.  Also complains of nasal discharge during this period.  Pt does not have a hx of sinusitis she is aware of.  She felt the toradol from yesterday helped with her pain but it has since returned. Consider migraine vs bacterial sinusitis - Torodol x 1 today -  starting amoxicillin 500 TID x 5d (11/28 - 12/2)  OHS  OSA Home O2 level of 5 to 6 L. Currently 99% on 7-8L. Home meds include albuterol inhaler and DuoNebs as needed as well as Trelegy and home O2. -Continue CPAP overnight -Continue home medications -Monitor respiratory status - Titrate O2 to home levels of 5-6 L with O2 goal of > 94% -Incentive spirometry every 4 hours -Ambulate every shift  HFpEFHypertension: Chronic, stable Home medications includeatorvastatin, Cardizem, metoprolol.Initially hypertensive in the ED, but once admitted normotensive. Last BP 109/62 mmHg with pulse 92/min. -Continue home meds -Declineto consult cardiology at this time,may in future if clinically indicated  Asymmetric BLE edema: Chronic, stable Noted on admission physical exam, L>R. Given tachypnea and tachycardia, obtained CTA PE chest; negative for PE.  -Continue to monitor -Ambulate patient -Activity as tolerated -Elevate legs  Paroxysmal A. fib on Eliquis: Chronic, stable At home takesEliquis 5 mg twice daily, Cardizem, flecainide. -Continuous telemetry 24 to 48 hours -Continue home medications  Type 2 diabetes mellitus BG today is 105. Glucose at admission 99.No current home meds, appears to be controlled by diet.Hemoglobin A1c 11/24 was 6.4 -Monitor BMPs daily.  Thrombocytosis, chronic Platelets 504 on admission. -Continue to Monitor   Normocytic anemia: Chronic Last check yesterday, hemoglobin 10.7 with MCV and a 4.8.  MDD: Chronic, stable Home meds includevenlafaxine total dose 187.5 mg daily. -Continue home med.  Anxiety -Continue home hydroxyzine  GERD: Chronic, stable Home meds includeomeprazole 20 mg daily. -Protonix while inpatient  Osteoarthritis: Chronic, stable Home meds includegabapentin 200 mg 3 times daily. -Continue home med  FEN/GI:Regular diet PPx:On Eliquis  Disposition: SNF  Subjective:  Pt complains of headache.  Pain  is frontal. She says it has been ongoing for 4 weeks and is worse today.  She states it is worse than her usual migraines.  She also states she has been having nasal discharge at this time.  She does not know if it has been purulent bc it has been draining down the back of her throat.   Objective: Temp:  [98.3 F (36.8 C)-98.7 F (37.1 C)] 98.7 F (37.1 C) (11/27 2008) Pulse Rate:  [87-123] 112 (11/28 0718) Resp:  [18-20] 18 (11/28 0718) BP: (124-135)/(81-99) 135/99 (11/27 2008) SpO2:  [93 %-99 %] 98 % (11/28 5830) Physical Exam: General: alert.  Oriented.  Laying in bed with wet towel over her forehead.   HEENT: mild TTP with tapping of the forehead.  None with maxillary or ethmnoid sinus palpation.  Cardiovascular: distant heart sounds.  RRR.  Respiratory: LCTAB.  Abdomen: obese pannus.     Laboratory: Recent Labs  Lab 05/20/20 1353 05/21/20 0248 05/22/20 0937  WBC 9.7 10.1 10.4  HGB 11.0* 11.5* 10.7*  HCT 39.4 39.7 37.9  PLT 504* 595* 518*   Recent Labs  Lab 05/20/20 1353 05/20/20 1353 05/21/20 0248 05/21/20 0248 05/22/20 0937 05/23/20 0212 05/24/20 0134  NA 141   < > 141   < > 140 140 140  K 3.7   < > 4.0   < > 3.3* 3.9 4.3  CL 95*   < > 94*   < > 93* 90* 88*  CO2 34*   < > 38*   < > 38* 40* 43*  BUN 8   < > 10   < > 11 10 12   CREATININE 0.49   < > 0.57   < > 0.55 0.62 0.61  CALCIUM 9.0   < > 9.2   < > 8.9 8.9 8.6*  PROT 8.1  --  8.0  --   --   --   --   BILITOT 0.4  --  0.4  --   --   --   --   ALKPHOS 95  --  99  --   --   --   --   ALT 17  --  19  --   --   --   --   AST 21  --  19  --   --   --   --   GLUCOSE 99   < > 157*   < > 171* 167* 132*   < > = values in this interval not displayed.      Imaging/Diagnostic Tests:   Benay Pike, MD 05/24/2020, 8:52 AM PGY-3, [facility] Intern pager: [phone], text pages welcome

## 2020-05-24 NOTE — Consult Note (Signed)
NEUROLOGY CONSULTATION NOTE   Date of service: May 24, 2020 Patient Name: [name] White MRN:  [id number] DOB:  October 22, 1955 Reason for consult: "Stroke code"  History of Present Illness  [name] White is a 64 y.o. female with PMH significant for DM2, smoker, COPD, pAfibb on Eliquis and last dose per [name] White was at 0915 this AM who was admitted with hypoxic/hypercapnic respiratory failure in the setting of diastolic heart failure.  Patient was noted to have speech problems and thus a stroke code was called with a last known well of 1300 on 05/24/2020.  On my brief evaluation, patient noted to have tremoring and increased tone in bilateral upper extremities but she is able to follow commands and answer questions. Her speech however is slowed and it is difficult to say if she has aphasia out of proportion to her encephalopathy.  LKW: 1300 on 05/24/20. MRS: 2 TPA: no, she is on eliquis Thrombectomy: No, no LVO  NIHSS components Score: Comment  1a Level of Conscious 0[x]  1[]  2[]  3[]      1b LOC Questions 0[x]  1[]  2[]       1c LOC Commands 0[x]  1[]  2[]       2 Best Gaze 0[x]  1[]  2[]       3 Visual 0[x]  1[]  2[]  3[]      4 Facial Palsy 0[x]  1[]  2[]  3[]      5a Motor Arm - left 0[x]  1[]  2[]  3[]  4[]  UN[]    5b Motor Arm - Right 0[x]  1[]  2[]  3[]  4[]  UN[]    6a Motor Leg - Left 0[]  1[]  2[x]  3[]  4[]  UN[]    6b Motor Leg - Right 0[]  1[]  2[x]  3[]  4[]  UN[]    7 Limb Ataxia 0[x]  1[]  2[]  3[]  UN[]     8 Sensory 0[x]  1[]  2[]  UN[]      9 Best Language 0[]  1[x]  2[]  3[]      10 Dysarthria 0[x]  1[]  2[]  UN[]      11 Extinct. and Inattention 0[x]  1[]  2[]       TOTAL: 5       ROS   Difficult to obtain given her slowed speech and concern for word finding difficulty.  Past History   Past Medical History:  Diagnosis Date   Abdominal wall hematoma 03/06/2019   Acute bronchitis 08/29/2018   Acute on chronic respiratory failure with hypoxia (HCC) 08/29/2018   Anxiety    Asthma    CHF (congestive heart  failure) (HCC)    Chronic lower back pain    COPD (chronic obstructive pulmonary disease) ([place])    Depression    Diabetes mellitus without complication ([place])    Exposure to COVID-19 virus 11/02/2018   Fall 02/2019   Family history of anesthesia complication    "daughter; causes her to pass out afterwards"   GERD (gastroesophageal reflux disease)    History of atrial flutter 06/26/2015   History of pulmonary embolus (PE) 11/03/2014   Hypertension    Hyperthyroidism    Left medial tibial stress syndrome 12/26/2017   Lower GI bleed    Migraine    "monthly" (12/28/2013)   Non-traumatic rhabdomyolysis 11/03/2014   Obesity hypoventilation syndrome ([place]) 03/06/2019   Osteoarthritis    "both knees; back of my neck; right pelvic bone" (12/28/2013)   Paroxysmal A-fib ([place])    Pulmonary embolism ([place]) 12/28/2013   "2 clots in each lung"   Past Surgical History:  Procedure Laterality Date   ABDOMINAL HYSTERECTOMY     APPENDECTOMY     BREAST CYST EXCISION Right  DILATION AND CURETTAGE OF UTERUS     ELECTROPHYSIOLOGIC STUDY N/A 05/27/2015   Procedure: A-Flutter;  Surgeon: Evans Lance, MD;  Location: [facility];  Service: Cardiovascular;  Laterality: N/A;   EXCISIONAL HEMORRHOIDECTOMY     NASAL SINUS SURGERY  2007   THYROIDECTOMY, PARTIAL Right 2005   TUBAL LIGATION     WISDOM TOOTH EXTRACTION     Family History  Problem Relation Age of Onset   Osteoarthritis Mother    Asthma Mother    Heart failure Mother    Breast cancer Daughter    Social History   Socioeconomic History   Marital status: Divorced    Spouse name: Not on file   Number of children: 1   Years of education: 12   Highest education level: High school graduate  Occupational History   Occupation: disabled   Tobacco Use   Smoking status: Former Smoker    Packs/day: 0.25    Years: 20.00    Pack years: 5.00    Types: Cigarettes    Quit date: 07/17/1999    Years since  quitting: 20.8   Smokeless tobacco: Never Used  Scientific laboratory technician Use: Never used  Substance and Sexual Activity   Alcohol use: Not Currently    Comment: "drank some in my 73's"   Drug use: No   Sexual activity: Not Currently  Other Topics Concern   Not on file  Social History Narrative   Disabled single patient.   Food insecurities   Lack of transportation   Social Determinants of Health   Financial Resource Strain:    Difficulty of Paying Living Expenses: Not on file  Food Insecurity:    Worried About Charity fundraiser in the Last Year: Not on file   [employer] of Food in the Last Year: Not on file  Transportation Needs:    Lack of Transportation (Medical): Not on file   Lack of Transportation (Non-Medical): Not on file  Physical Activity:    Days of Exercise per Week: Not on file   Minutes of Exercise per Session: Not on file  Stress:    Feeling of Stress : Not on file  Social Connections:    Frequency of Communication with Friends and Family: Not on file   Frequency of Social Gatherings with Friends and Family: Not on file   Attends Religious Services: Not on file   Active Member of [employer] or Organizations: Not on file   Attends Archivist Meetings: Not on file   Marital Status: Not on file   Allergies  Allergen Reactions   Caffeine Other (See Comments)    Migraine   Hydralazine Hcl Other (See Comments)    AKI leading to rhabdo and electrolyte abnormalities   Hydrocodone Nausea And Vomiting    Headache, also   Ciprofloxacin Hives and Rash   Erythromycin Hives and Rash   Lisinopril Cough   Oxycodone Nausea And Vomiting and Other (See Comments)    Headaches, also   Sulfamethoxazole-Trimethoprim Rash   Tape Rash and Other (See Comments)    Medications   Medications Prior to Admission  Medication Sig Dispense Refill Last Dose   acetaminophen (TYLENOL) 325 MG tablet Take 2 tablets (650 mg total) by mouth every 6 (six)  hours as needed for mild pain. (Patient taking differently: Take 650 mg by mouth every 6 (six) hours as needed for mild pain or headache. )   05/20/2020 at Unknown time   albuterol (  VENTOLIN HFA) 108 (90 Base) MCG/ACT inhaler Inhale 2 puffs into the lungs every 4 (four) hours as needed for wheezing or shortness of breath. 18 g 1 05/19/2020 at Unknown time   apixaban (ELIQUIS) 5 MG TABS tablet Take 1 tablet (5 mg total) by mouth 2 (two) times daily. 180 tablet 1 05/20/2020 at 0700   atorvastatin (LIPITOR) 40 MG tablet Take 1 tablet (40 mg total) by mouth daily. (Patient taking differently: Take 40 mg by mouth every evening. ) 90 tablet 3 05/19/2020 at Unknown time   azelastine (OPTIVAR) 0.05 % ophthalmic solution INSTILL 1 DROP IN BOTH EYES TWICE DAILY AS NEEDED FOR ITCHY EYES 6 mL 1 Past Week at Unknown time   cetirizine (ZYRTEC) 10 MG tablet TAKE 1 TABLET(10 MG) BY MOUTH DAILY (Patient taking differently: Take 10 mg by mouth daily. ) 30 tablet 1 05/20/2020 at Unknown time   diclofenac sodium (VOLTAREN) 1 % GEL APPLY 2 GRAMS EXTERNALLY TO THE AFFECTED AREA FOUR TIMES DAILY (Patient taking differently: Apply 2 g topically 4 (four) times daily as needed (arthritis pain - knees, shoulder and thighs). ) 100 g 0 05/20/2020 at Unknown time   diltiazem (CARDIZEM CD) 120 MG 24 hr capsule TAKE 1 CAPSULE BY MOUTH ONCE DAILY 30 capsule 0 Past Week at Unknown time   flecainide (TAMBOCOR) 100 MG tablet TAKE 1 TABLET BY MOUTH TWICE DAILY 180 tablet 3 05/20/2020 at Unknown time   furosemide (LASIX) 40 MG tablet Take 1.5 tablets (60 mg total) by mouth daily. 180 tablet 1 05/19/2020 at Unknown time   gabapentin (NEURONTIN) 100 MG capsule Take 2 capsules (200 mg total) by mouth 3 (three) times daily. 180 capsule 1 05/19/2020 at Unknown time   hydrOXYzine (VISTARIL) 25 MG capsule TAKE 1 CAPSULE(25 MG) BY MOUTH DAILY AS NEEDED FOR ANXIETY (Patient taking differently: Take 25 mg by mouth daily as needed for  anxiety. ) 60 capsule 0 05/19/2020 at Unknown time   ipratropium-albuterol (DUONEB) 0.5-2.5 (3) MG/3ML SOLN Take 3 mLs by nebulization every 6 (six) hours as needed (shortness of breath, wheezing). 360 mL 0 05/20/2020 at Unknown time   Melatonin 3 MG TABS Take 1 tablet (3 mg total) by mouth at bedtime as needed (sleep). 30 tablet 0 unknown   metoprolol tartrate (LOPRESSOR) 25 MG tablet TAKE 1/2 TABLET(12.5 MG) BY MOUTH TWICE DAILY (Patient taking differently: Take 12.5 mg by mouth 2 (two) times daily. ) 90 tablet 0 05/20/2020 at 0730   Multiple Vitamin (MULTIVITAMIN) tablet Take 1 tablet by mouth every evening.    05/19/2020 at Unknown time   omeprazole (PRILOSEC) 20 MG capsule Take 1 capsule (20 mg total) by mouth daily. 90 capsule 0 05/19/2020 at Unknown time   potassium chloride (KLOR-CON) 10 MEQ tablet TAKE 2 TABLETS(20 MEQ) BY MOUTH DAILY (Patient taking differently: Take 20 mEq by mouth daily. ) 180 tablet 0 05/20/2020 at Unknown time   venlafaxine (EFFEXOR) 37.5 MG tablet Take 1 tablet (37.5 mg total) by mouth daily with breakfast. Take with a 150mg  ER capsule every morning for a total of 187.5mg  90 tablet 2 over 1 month at Unknown time   venlafaxine XR (EFFEXOR-XR) 150 MG 24 hr capsule Take 1 capsule (150 mg total) by mouth daily with breakfast. Take with a 37.5 mg tablet every morning (Patient taking differently: Take 150 mg by mouth daily with breakfast. Take with a 37.5mg  tablet every morning for a total of 187.5mg ) 90 capsule 3 05/20/2020 at Unknown time   cyclobenzaprine (FLEXERIL)  5 MG tablet TAKE 1 TABLET(5 MG) BY MOUTH TWICE DAILY AS NEEDED FOR MUSCLE SPASMS 30 tablet 1    guaiFENesin (MUCINEX) 600 MG 12 hr tablet Take 1 tablet (600 mg total) by mouth 2 (two) times daily as needed for cough. (Patient not taking: Reported on 05/20/2020) 30 tablet 0 Completed Course at Unknown time   ipratropium (ATROVENT) 0.06 % nasal spray Place 2 sprays into both nostrils 4 (four) times daily.  15 mL 12    OXYGEN Inhale 6-7 L/min into the lungs continuous.       PRESCRIPTION MEDICATION See admin instructions. [employer]-  At bedtime and during any time of rest        Vitals   Vitals:   05/23/20 2008 05/24/20 0417 05/24/20 0718 05/24/20 0855  BP: (!) 135/99   117/73  Pulse: 87 (!) 104 (!) 112 99  Resp: 18 20 18 19   Temp: 98.7 F (37.1 C)   98.4 F (36.9 C)  TempSrc: Oral   Oral  SpO2: 98% 99% 98% 99%  Weight:      Height:         Body mass index is 59.54 kg/m.  Physical Exam   General: Laying comfortably in bed; in no acute distress. HENT: Normal oropharynx and mucosa. Normal external appearance of ears and nose. Neck: Supple, no pain or tenderness CV: No JVD. No peripheral edema. Pulmonary: Symmetric Chest rise. Normal respiratory effort. Abdomen: Soft to touch, non-tender. Ext: No cyanosis, edema, or deformity Skin: No rash. Normal palpation of skin.  Musculoskeletal: Normal digits and nails by inspection. No clubbing.  Neurologic Examination  Mental status/Cognition: Alert, oriented to self, place, month and year, good attention. Speech/language: Slowed, non fluent, comprehension intact to simple commands, can name some objects but not all, repetition intact. Cranial nerves:   CN II Pupils equal and reactive to light, no VF deficits   CN III,IV,VI EOM intact, no gaze preference or deviation, no nystagmus   CN V normal sensation in V1, V2, and V3 segments bilaterally   CN VII no asymmetry, no nasolabial fold flattening   CN VIII normal hearing to speech   CN IX & X normal palatal elevation, no uvular deviation   CN XI 5/5 head turn and 5/5 shoulder shrug bilaterally   CN XII midline tongue protrusion   Motor:  Muscle bulk: normal, tone increased in BL upper extremities, pronator drift: none, tremor yes BL upper extremity tremor. Mvmt Root Nerve  Muscle Right Left Comments  SA C5/6 Ax Deltoid 5 5   EF C5/6 Mc Biceps     EE  C6/7/8 Rad Triceps     WF C6/7 Med FCR     WE C7/8 PIN ECU     F Ab C8/T1 U ADM/FDI 5 5   HF L1/2/3 Fem Illopsoas 3 3   KE L2/3/4 Fem Quad     DF L4/5 D Peron Tib Ant     PF S1/2 Tibial Grc/Sol      Sensation:  Light touch Intact throughout   Pin prick    Temperature    Vibration   Proprioception    Coordination/Complex Motor:  - Finger to Nose intact BL  Labs   CBC:  Recent Labs  Lab 05/20/20 1353 05/20/20 1353 05/21/20 0248 05/22/20 0937  WBC 9.7   < > 10.1 10.4  NEUTROABS 6.2  --   --   --   HGB 11.0*   < > 11.5* 10.7*  HCT 39.4   < > 39.7 37.9  MCV 95.9   < > 94.7 94.8  PLT 504*   < > 595* 518*   < > = values in this interval not displayed.    Basic Metabolic Panel:  Lab Results  Component Value Date   NA 140 05/24/2020   K 4.3 05/24/2020   CO2 43 (H) 05/24/2020   GLUCOSE 132 (H) 05/24/2020   BUN 12 05/24/2020   CREATININE 0.61 05/24/2020   CALCIUM 8.6 (L) 05/24/2020   GFRNONAA >60 05/24/2020   GFRAA >60 03/15/2020   Lipid Panel:  Lab Results  Component Value Date   LDLCALC 106 (H) 07/21/2019   HgbA1c:  Lab Results  Component Value Date   HGBA1C 6.4 (H) 05/20/2020   Urine Drug Screen:     Component Value Date/Time   LABOPIA NONE DETECTED 06/21/2019 1111   COCAINSCRNUR NONE DETECTED 06/21/2019 1111   LABBENZ NONE DETECTED 06/21/2019 1111   AMPHETMU NONE DETECTED 06/21/2019 1111   THCU NONE DETECTED 06/21/2019 1111   LABBARB NONE DETECTED 06/21/2019 1111    Alcohol Level     Component Value Date/Time   ETH <5 02/04/2017 1939   CTH without contrast: Personally reviewed and CTH was negative for a large hypodensity concerning for a large territory infarct or hyperdensity concerning for an [place]  Impression   [name] White is a 64 y.o. female with PMH significant for DM2, smoker, COPD, pAfibb on Eliquis and last dose per MAR was at 0915 this AM who we saw as a code stroke for concern for aphasia. Her neurologic examination is notable for  encephalopathy, slowed speech, somewhat difficult to assess if her aphasia is out of proportion to encephalopathy. She is not a tPA candidate due to Eliquis, no thrombectomy as there was no LVO.  Would recommend obtaining MRI Brain without contrast, no further workup if negative for a stroke.  Recommendations  - MRI Brain without contrast - if negative for a stroke, no further neurological workup recommended. ______________________________________________________________________   Thank you for the opportunity to take part in the care of this patient. If you have any further questions, please contact the neurology consultation attending.  Signed,  [place] Pager Number [phone]

## 2020-05-24 NOTE — Progress Notes (Signed)
Pt remains increasingly lethargic on BiPap. Received call from tele stating patient is intermittently converting between her baseline first degree heart block to second degree heart block type 1. Kim MD notified via page who then came to bedside to assess patient.

## 2020-05-24 NOTE — Progress Notes (Signed)
Paged by Marta Lamas, who reports that patient is more lethargic than earlier exam.  Her ABG pH is 7.1 with CO2 greater than 120.  She has been placed on BiPAP at this time. Patient CT returned negative.  Neurology consulted for code stroke who recommended MRI.  Follow-up MRI results. Transfer patient to progressive.  Repeat ABG at 1700.   Wilber Oliphant, M.D.  4:13 PM 05/24/2020

## 2020-05-24 NOTE — Progress Notes (Signed)
Interim Progress Note:  Went to check in on patient due to her current clinical status.  She is still on BiPAP, part of her mouth is out of the mask.  Her most recent ABG at 1950 showing pH 7.22 (from 7.17>7.20 earlier) and PCO2 still >120.  Baseline PCO2 around 70-80 mmHg.  Blood pressure 110/88, pulse 75, temperature 97.8 F (36.6 C), temperature source Axillary, resp. rate 18, height 5\' 5"  (1.651 m), weight (!) 162.3 kg, SpO2 100 %. General: Resting on BiPAP Respiratory: Clear with normal WOB, driven by BiPAP around 18 RR Neuro: Somnolent but easily arousable to voice and touch, able to follow simple commands.  Falls asleep quickly after arousal.  EOMI.  Can perform handgrip and move her feet bilaterally and equally.  A/p: Acute on chronic hypercapnic respiratory failure with recent worsening earlier this afternoon: Mild improvement, in setting of known severe OHS and vocal cord dysfunction.  Neurologically appears slightly improved per physical exam noted earlier today.  Additionally slow but steady increase in pH via ABG.  Spoke with RT who stated she was optimized for BiPAP settings without any significant change to be made.  Added mag/Phos to BMP, awaiting results.  Additionally waiting for MRI brain due to concern for possible code stroke earlier today however hypercapnia alone may be etiology of current presentation (vs acute worsening via CVA--no focal neuro deficits on limited exam), CT head w/o acute changes.  RN readjusted BiPAP for improved seal.  Will continue with plan of care as is, she is DNR/DNI.  Overnight will monitor her mental status and recheck ABG in the a.m.   Patriciaann Clan, DO

## 2020-05-24 NOTE — Progress Notes (Addendum)
FPTS Interim Progress Note  S: Went to bedside to evaluate patient as Dr. Ardelia Mems was concerned about acute change in mental status. Patient is able to respond to questions and follow commands.  RN at bedside reports that this is an acute change from earlier.  O: BP 117/73 (BP Location: Right Arm)   Pulse 99   Temp 98.4 F (36.9 C) (Oral)   Resp 19   Ht 5\' 5"  (1.651 m)   Wt (!) 162.3 kg   SpO2 99%   BMI 59.54 kg/m   General: Awake, responding to questions and following commands. Neuro: No obvious cranial neurologic deficits.  Pupils 4 mm and reactive to light.  There is no eye deviation on primary gaze.  No ptosis.  Face is symmetric with normal eye closure.  Tongue is midline with normal movements.  There is no pronator drift of outstretched arms.  Muscle bulk and tone are normal.  Upper extremities are 5 out of 5 strength in biceps, triceps.  Grip strength 5 out of 5.  She has a nonrhythmic, low-frequency tremor of fingers and thumbs of bilateral hands with rest and while active.  No obvious asterixis.  No myoclonus. Given body habitus, reflexes are difficult to obtain.  Patient is able to move her lower extremities on command.  She is able to sit up in bed on command.  On initial exam, when asked what the date is, she says "12" and is unable to finish her sentence.  Further along in exam, she is able to state her full name.  When asked other orientation questions such as location and time, she repeats her name.  She responds to some questions.   A/P: Patient's vitals are stable.  Her oxygenation is stable at 6L.  Patient does not have any focal neurologic deficits on exam, but given difficulties with answering questions, have called code stroke and patient for stat CT.  Her labs this morning were unremarkable.   Have also obtained ABG.  We will follow up with results. Will update family with results as well.   Wilber Oliphant, MD 05/24/2020, 2:53 PM PGY-3, [facility] Service pager [phone]

## 2020-05-24 NOTE — Progress Notes (Addendum)
Received page from floor that patient's telemetry shows second-degree heart block type I. I went to bedside to reevaluate the patient.  Overall, her vital signs are stable. She wakes with very minimal sternal rub and falls back asleep. No other changes in exam from earlier.  Plan:  Will obtain chest x-ray to rule out any acute pulm process such as pulmonary edema or infection.  MRI is ordered as stat.  Additionally, waiting for second ABG results to see if there is any improvement with CO2.  We will also obtain BMP for any other electrolyte abnormalities.  We will also obtain EKG and troponin.   I have called the patient's niece, Alta Corning, to update her on the patient's status and to inform her of the significance of patient's DNR CODE STATUS.  Patient is currently protecting her own airway and wakes easily.  Patient's niece does understand that patient's wishes of DNR means that she will not be intubated if she does have a decline in her respiratory status.  Patient's niece does not have any other questions at this time.   Wilber Oliphant, M.D.  6:27 PM 05/24/2020

## 2020-05-25 DIAGNOSIS — R401 Stupor: Secondary | ICD-10-CM | POA: Diagnosis not present

## 2020-05-25 DIAGNOSIS — R0600 Dyspnea, unspecified: Secondary | ICD-10-CM | POA: Diagnosis not present

## 2020-05-25 DIAGNOSIS — R0602 Shortness of breath: Secondary | ICD-10-CM | POA: Diagnosis not present

## 2020-05-25 LAB — BLOOD GAS, ARTERIAL
Acid-Base Excess: 21.8 mmol/L — ABNORMAL HIGH (ref 0.0–2.0)
Acid-Base Excess: 20.2 mmol/L — ABNORMAL HIGH (ref 0.0–2.0)
Bicarbonate: 48.5 mmol/L — ABNORMAL HIGH (ref 20.0–28.0)
Bicarbonate: 45.3 mmol/L — ABNORMAL HIGH (ref 20.0–28.0)
Drawn by: 33100
Drawn by: 441371
FIO2: 60
FIO2: 40
O2 Saturation: 95.9 %
O2 Saturation: 94.1 %
Patient temperature: 36.6
Patient temperature: 36.3
pCO2 arterial: 85.9 mmHg (ref 32.0–48.0)
pCO2 arterial: 54.3 mmHg — ABNORMAL HIGH (ref 32.0–48.0)
pH, Arterial: 7.368 (ref 7.350–7.450)
pH, Arterial: 7.528 — ABNORMAL HIGH (ref 7.350–7.450)
pO2, Arterial: 98.2 mmHg (ref 83.0–108.0)
pO2, Arterial: 61.6 mmHg — ABNORMAL LOW (ref 83.0–108.0)

## 2020-05-25 LAB — CBC
HCT: 37.7 % (ref 36.0–46.0)
Hemoglobin: 10.3 g/dL — ABNORMAL LOW (ref 12.0–15.0)
MCH: 27.1 pg (ref 26.0–34.0)
MCHC: 27.3 g/dL — ABNORMAL LOW (ref 30.0–36.0)
MCV: 99.2 fL (ref 80.0–100.0)
Platelets: 452 10*3/uL — ABNORMAL HIGH (ref 150–400)
RBC: 3.8 MIL/uL — ABNORMAL LOW (ref 3.87–5.11)
RDW: 15.6 % — ABNORMAL HIGH (ref 11.5–15.5)
WBC: 10.8 10*3/uL — ABNORMAL HIGH (ref 4.0–10.5)
nRBC: 0 % (ref 0.0–0.2)

## 2020-05-25 LAB — BASIC METABOLIC PANEL
Anion gap: 9 (ref 5–15)
BUN: 18 mg/dL (ref 8–23)
CO2: 44 mmol/L — ABNORMAL HIGH (ref 22–32)
Calcium: 8.5 mg/dL — ABNORMAL LOW (ref 8.9–10.3)
Chloride: 88 mmol/L — ABNORMAL LOW (ref 98–111)
Creatinine, Ser: 0.49 mg/dL (ref 0.44–1.00)
GFR, Estimated: >60 mL/min (ref >60)
Glucose, Bld: 100 mg/dL — ABNORMAL HIGH (ref 70–99)
Potassium: 4.8 mmol/L (ref 3.5–5.1)
Sodium: 141 mmol/L (ref 135–145)

## 2020-05-25 LAB — MAGNESIUM: Magnesium: 1.8 mg/dL (ref 1.7–2.4)

## 2020-05-25 LAB — PHOSPHORUS: Phosphorus: 3.7 mg/dL (ref 2.5–4.6)

## 2020-05-25 LAB — TROPONIN I (HIGH SENSITIVITY): Troponin I (High Sensitivity): 13 ng/L (ref <18)

## 2020-05-25 MED ORDER — SODIUM CHLORIDE 0.9 % IV SOLN
3.0000 g | Freq: Four times a day (QID) | INTRAVENOUS | Status: DC
  Administered 2020-05-25 – 2020-05-26 (×3): 3 g via INTRAVENOUS
  Filled 2020-05-25: qty 3
  Filled 2020-05-25 (×2): qty 8
  Filled 2020-05-25: qty 3
  Filled 2020-05-25: qty 8

## 2020-05-25 MED ORDER — ACETAMINOPHEN 325 MG PO TABS
650.0000 mg | ORAL_TABLET | Freq: Four times a day (QID) | ORAL | Status: DC | PRN
  Administered 2020-05-29: 650 mg via ORAL
  Filled 2020-05-25: qty 2

## 2020-05-25 MED ORDER — ONDANSETRON HCL 4 MG PO TABS: 4.0000 mg | ORAL_TABLET | Freq: Three times a day (TID) | ORAL | Status: DC | PRN

## 2020-05-25 MED ORDER — FUROSEMIDE 10 MG/ML IJ SOLN
60.0000 mg | Freq: Every day | INTRAMUSCULAR | Status: DC
  Administered 2020-05-25: 60 mg via INTRAVENOUS
  Filled 2020-05-25: qty 6

## 2020-05-25 MED ORDER — ENOXAPARIN SODIUM 300 MG/3ML IJ SOLN
1.0000 mg/kg | Freq: Two times a day (BID) | INTRAMUSCULAR | Status: DC
  Administered 2020-05-25: 160 mg via SUBCUTANEOUS
  Filled 2020-05-25 (×5): qty 1.6

## 2020-05-25 MED ORDER — METOPROLOL TARTRATE 5 MG/5ML IV SOLN: 2.5000 mg | Freq: Four times a day (QID) | INTRAVENOUS | Status: DC

## 2020-05-25 MED ORDER — ONDANSETRON HCL 4 MG/2ML IJ SOLN: 4.0000 mg | Freq: Three times a day (TID) | INTRAMUSCULAR | Status: DC | PRN

## 2020-05-25 MED ORDER — METOPROLOL TARTRATE 5 MG/5ML IV SOLN
2.5000 mg | Freq: Four times a day (QID) | INTRAVENOUS | Status: DC
  Administered 2020-05-25 – 2020-05-26 (×3): 2.5 mg via INTRAVENOUS
  Filled 2020-05-25 (×3): qty 5

## 2020-05-25 MED ORDER — ACETAMINOPHEN 650 MG RE SUPP: 650.0000 mg | RECTAL | Status: DC | PRN

## 2020-05-25 NOTE — Progress Notes (Signed)
Interim Progress Note:  Went to check in on patient again given clinical status.  Her RN reports when she briefly removed her BiPAP a little earlier this evening to give medications that she was alert and able to answer orienting questions appropriately.  On my evaluation now, she becomes easily alert to voice and is able to keep her eyes open.  Able to follow complex commands.  Moving all extremities spontaneously, however did not fully assess strength during this visit.  Still on BiPAP.  A/P: Acute on chronic hypercapnic respiratory failure, in setting of known severe OHS and vocal cord dysfunction: Improving.  She fortunately has continued to make improvement since my last evaluation earlier this evening.  Will continue BiPAP and recheck ABG around 5 AM, suspect this will be improved as she has clinically.  She has not gone for MRI yet, she likely will get this later this morning.  Patriciaann Clan, DO

## 2020-05-25 NOTE — Progress Notes (Signed)
Physical Therapy Treatment Patient Details Name: [name] White MRN: [id number] DOB: March 09, 1956 Today's Date: 05/25/2020    History of Present Illness 64 y.o. female presenting with 2-week progressive history of dyspnea, chest tightness, and increased oxygen requirement. Pt found to have decreased responsiveness on 11/28 and waiting for MRI. PMH is significant for OHS OSA, HFpEF, HTN, paroxysmal A. fib on Eliquis, T2DM, MDD, GERD, osteoarthritis, morbid obesity, remote tobacco use with 20-pack-year cigarette history.    PT Comments    Pt is on BiPAP on presentation and as a result, communication is limited. Pt shows significant decrease in bed mobility, balance, and cognition as compared to last session. Pt was unable to perform transfers and gait secondary to reports of 'not being able to breathe', mod-max+2 assist level, and lack of ability to follow commands this session. Pts frequency of acute therapy has been decreased to 2x/week secondary to updated d/c plan. Pt would benefit from a d/c to a SNF due to limited mobility and tolerance to therapy, as well as cognition. BiPAP settings: FiO2 50%, IPAP 22, EPAP 8 Resting vitals: 94% SpO2 at 35 RR EOB vitals: 135 bpm, 35-40 RR, and 95% SpO2   Follow Up Recommendations  SNF;Supervision/Assistance - 24 hour     Equipment Recommendations  Hospital bed;Wheelchair (measurements PT);Wheelchair cushion (measurements PT);3in1 (PT);Other (comment);Rolling walker with 5" wheels (dependent on progression and d/c)    Recommendations for Other Services OT consult     Precautions / Restrictions Precautions Precautions: Fall Precaution Comments: monitor SpO2 Restrictions Weight Bearing Restrictions: No    Mobility  Bed Mobility Overal bed mobility: Needs Assistance Bed Mobility: Supine to Sit;Sit to Supine     Supine to sit: HOB elevated;+2 for physical assistance;Mod assist Sit to supine: HOB elevated;+2 for physical assistance;Max  assist   General bed mobility comments: pt was mod assist +2 to go from supine to sit for moving legs towards EOB and 1 person mod hand held assist for trunk navigation towards sitting, pt inconsistently responded to multimodal cues to move legs towards EOB; max assist +2 to return to supine for navigation of trunk and lifting legs onto bed; total +2 to scoot towards HOB in trendelenburg  Transfers                 General transfer comment: not attempted secondary to assist level needed with EOB sitting  Ambulation/Gait                 Stairs             Wheelchair Mobility    Modified Rankin (Stroke Patients Only)       Balance Overall balance assessment: Needs assistance Sitting-balance support: No upper extremity supported;Bilateral upper extremity supported;Feet supported Sitting balance-Leahy Scale: Poor Sitting balance - Comments: EOB 3 minutes then pt stated that she couldn't breathe even though SpO2 was at or above 91%, so was returned to supine; pt kept reaching out for therapist arms to maintain sitting balance; pt required mod assist +1 for hand placement and steading trunk; pt kept stating "ok" when asked to put hands on bed, but would continue to hold onto therapist arm until her hands were placed with assistance on the bed Postural control: Posterior lean     Standing balance comment: not attempted                            Cognition Arousal/Alertness: Lethargic Behavior During Therapy: Anxious Overall  Cognitive Status: Impaired/Different from baseline Area of Impairment: Following commands;Safety/judgement;Awareness;Attention                   Current Attention Level: Focused   Following Commands: Follows one step commands inconsistently;Follows one step commands with increased time Safety/Judgement: Decreased awareness of deficits Awareness: Intellectual   General Comments: pt perseverating on stating "I can do  therapy"; pt anxious and stating that she can't breathe even though SpO2 is [facility]      Exercises General Exercises - Lower Extremity Short Arc Quad: AROM;Strengthening;Right;Left;10 reps;Supine;Other (comment) (performed with lack of concentric or eccentric control; also needed multimodal cues to continue performing reps)    General Comments        Pertinent Vitals/Pain Pain Assessment: No/denies pain    Home Living                      Prior Function            PT Goals (current goals can now be found in the care plan section) Acute Rehab PT Goals Patient Stated Goal: unable to state Progress towards PT goals: Goals downgraded-see care plan (change in medical status)    Frequency    Min 2X/week (secondary to cognition, tolerance to therapy, and d/c plan)      PT Plan Frequency needs to be updated    Co-evaluation              AM-PAC PT "6 Clicks" Mobility   Outcome Measure  Help needed turning from your back to your side while in a flat bed without using bedrails?: Total Help needed moving from lying on your back to sitting on the side of a flat bed without using bedrails?: Total Help needed moving to and from a bed to a chair (including a wheelchair)?: Total Help needed standing up from a chair using your arms (e.g., wheelchair or bedside chair)?: Total Help needed to walk in hospital room?: Total Help needed climbing 3-5 steps with a railing? : Total 6 Click Score: 6    End of Session Equipment Utilized During Treatment: Other (comment) (BiPAP) Activity Tolerance: Treatment limited secondary to medical complications (Comment) Patient left: in chair;with call bell/phone within reach;with bed alarm set   PT Visit Diagnosis: Other abnormalities of gait and mobility (R26.89);Muscle weakness (generalized) (M62.81);Other symptoms and signs involving the nervous system (K59.935)     Time: 7017-7939 PT Time Calculation (min) (ACUTE ONLY): 22  min  Charges:  $Therapeutic Activity: 8-22 mins                     Caleb Popp, SPT [id number]   Kasra Melvin 05/25/2020, 10:10 AM

## 2020-05-25 NOTE — Progress Notes (Signed)
Family Medicine Teaching Service Daily Progress Note Intern Pager: [phone]  Patient name: [name] White Medical record number: [id number] Date of birth: 06/25/1956 Age: 64 y.o. Gender: female  Primary Care Provider: Richarda Osmond, DO Consultants: None Code Status: DNR  Pt Overview and Major Events to Date:  Admitted 11/24  Assessment and Plan: [name] White a 64 y.o.femalepresenting with2-week progressive history of dyspnea, chest tightness, and increased oxygen requirement. PMH is significant forOHS,pulmonary hypertension,OSA, HFpEF, HTN, paroxysmal A. fib on Eliquis, T2DM,MDD, GERD, osteoarthritis, morbid obesity,remote tobacco use with 20-pack-year cigarette history.  Acute on chronic hypoxic/hypercapnic respiratory failure 2/2 OHS in setting of diastolic HF Acute altered mental status as noted yesterday.  Improved somewhat from this morning to this afternoon.  Apparently has not been getting BiPAP nightly as ordered x4 days.  ABGs show increase CO2 retainment above baseline.  We will continue with BiPAP continuous until  significant improvement of mental status. -Continuous BiPAP at this time -Follow-up repeat ABG -Ordered sitter to assist with patient pulling mask off intermittently -BiPAP nightly -ContinuePT/OT -Maintain O2 sats 88-92%with goal being home 5-6 L O2 -SNF placement at D/c.  Headache Due to altered mental status, could not elicit complaints today.  S/p Toradol x1 yesterday.  Due to possible sinus infection, started amoxicillin 500 3 times daily yesterday. -Day #2 amoxicillin 500 TID x 5d (11/28 - 12/2)  OHSOSA Altered mental status as above. Home O2 level of 5 to 6 L. Home meds include albuterol inhaler and DuoNebs as needed as well as Trelegy and home O2. -Continue CPAP overnight -Continue home medications -Monitor respiratory status -Incentive spirometry every 4 hours  HFpEFHypertension:Chronic, stable Home medications  includeatorvastatin, Cardizem, metoprolol.Initially hypertensive in the ED, but once admitted normotensive.  Normotensive last 24 hours.  BPs 89-1 23/59-88, pulse 71-115. -Continue home meds -Declineto consult cardiology at this time,may in future if clinically indicated  Asymmetric BLE edema:Chronic, stable Noted on admission physical exam,L>R.Given tachypnea and tachycardia, obtained CTA PE chest; negative for PE. -Continue to monitor -Ambulate patient -Activity as tolerated -Elevate legs  Paroxysmal A. fib on Eliquis:Chronic, stable At home takesEliquis 5 mg twice daily, Cardizem, flecainide. -Continuous telemetry 24 to 48 hours -Continue home medications  Type 2 diabetes mellitus BG today is105.Glucose at admission 99.No current home meds, appears to be controlled by diet.Hemoglobin A1c 11/24 was 6.4 -Monitor BMPs daily.  Thrombocytosis, chronic Platelets 504 on admission. -Continue to Monitor   Normocytic anemia: Chronic Last check yesterday, hemoglobin 10.7 with MCV and a 4.8.  MDD: Chronic, stable Home meds includevenlafaxine total dose 187.5 mg daily. -Continue home med.  Anxiety -Continue home hydroxyzine  GERD: Chronic, stable Home meds includeomeprazole 20 mg daily. -Protonix while inpatient  Osteoarthritis: Chronic, stable Home meds includegabapentin 200 mg 3 times daily. -Continue home med  FEN/GI:Regular diet PPx:On Eliquis   Status is: Inpatient  Remains inpatient appropriate because:Altered mental status   Dispo:  Patient From: [place]  Planned Disposition: [place]  Expected discharge date: 05/28/20  Medically stable for discharge: No     Subjective:  This morning, patient was found sitting in bed with BiPAP applied.  Seemingly disoriented.  She could tell us her name but otherwise answered with repetitions of "I can do better" when asked place, year, president, complaints  or any pain. Could wiggle her toes on command.    This afternoon, after BiPAP, much improved.  Did tell me her name, city, and president.  When she incorrectly told me that "Biden"  was the year, but when I said repeated myself more slowly and enunciated "year" she perked up and "Say what? Oh, the year is..." and then she stared off while staring off into space.  Unsure how much is voluntary versus involuntary.  Would like to get MRI head however we currently cannot due to BiPAP requirement.  As soon as weaned from BiPAP, will obtain MRI head.  Objective: Temp:  [97.3 F (36.3 C)-99.2 F (37.3 C)] 98.9 F (37.2 C) (11/29 1453) Pulse Rate:  [71-115] 96 (11/29 1453) Resp:  [14-42] 25 (11/29 1453) BP: (89-123)/(59-88) 109/59 (11/29 1453) SpO2:  [93 %-100 %] 95 % (11/29 1414) FiO2 (%):  [40 %-80 %] 40 % (11/29 1414)  Physical Exam: General: Awake, disoriented Cardiovascular: Regular rate and rhythm, no murmurs appreciated Respiratory: Mild respiratory distress, clear lung sounds with BiPAP noise overlying Extremities: Moving all extremities spontaneously with intent, can wiggle toes on order  Laboratory: Recent Labs  Lab 05/21/20 0248 05/22/20 0937 05/25/20 0039  WBC 10.1 10.4 10.8*  HGB 11.5* 10.7* 10.3*  HCT 39.7 37.9 37.7  PLT 595* 518* 452*   Recent Labs  Lab 05/20/20 1353 05/20/20 1353 05/21/20 0248 05/22/20 0937 05/23/20 0212 05/24/20 0134 05/25/20 0039  NA 141   < > 141   < > 140 140 141  K 3.7   < > 4.0   < > 3.9 4.3 4.8  CL 95*   < > 94*   < > 90* 88* 88*  CO2 34*   < > 38*   < > 40* 43* 44*  BUN 8   < > 10   < > 10 12 18   CREATININE 0.49   < > 0.57   < > 0.62 0.61 0.49  CALCIUM 9.0   < > 9.2   < > 8.9 8.6* 8.5*  PROT 8.1  --  8.0  --   --   --   --   BILITOT 0.4  --  0.4  --   --   --   --   ALKPHOS 95  --  99  --   --   --   --   ALT 17  --  19  --   --   --   --   AST 21  --  19  --   --   --   --   GLUCOSE 99   < > 157*   < > 167* 132* 100*   < > = values  in this interval not displayed.    Imaging/Diagnostic Tests: DG Abd 1 View  Result Date: 05/24/2020 CLINICAL DATA:  Abdominal pain EXAM: ABDOMEN - 1 VIEW COMPARISON:  None. FINDINGS: Scattered large and small bowel gas is noted. Mild retained fecal material is seen consistent with a degree of constipation. No free air is noted. Contrast is noted within the bladder from recent CT examination. Degenerative changes of lumbar spine are noted. IMPRESSION: Mild constipation.  No obstructive changes are seen. Electronically Signed   By: Inez Catalina M.D.   On: 05/24/2020 23:23   DG CHEST PORT 1 VIEW  Result Date: 05/24/2020 CLINICAL DATA:  Shortness of breath EXAM: PORTABLE CHEST 1 VIEW COMPARISON:  05/20/2020 FINDINGS: Moderate cardiomegaly. Unchanged elevation of the right hemidiaphragm. Poor visualization of the left costophrenic angle. Degree of inflation is unchanged. No sizable pleural effusion. IMPRESSION: Unchanged cardiomegaly and elevation of the right hemidiaphragm. Electronically Signed   By: Cletus Gash.D.  On: 05/24/2020 18:57     Ezequiel Essex, MD 05/25/2020, 3:35 PM PGY-1, [place] Intern pager: [phone], text pages welcome

## 2020-05-25 NOTE — Progress Notes (Signed)
FPTS Interim Progress Note  S: Went to go see patient after nurse notify me me is ready for tachypnea.  Nurse reported respiratory rate 20 to 40s, depending on agitation anxiety.  Reports that RR up to 40s when RT in room adjusting BiPAP settings.  However when she checks to minutes later after patient is calm down, RR back down to 20s.  Per nurse, patient keeps disconnecting BiPAP mask or pulling it off.  Also keeps pulling SPO2 monitor box off of windowsill.  Patient voiced to me that she would like to take a BiPAP mask so she can brush her teeth "because they are dirty".  O: BP 123/74 (BP Location: Right Arm)    Pulse (!) 102    Temp 99.2 F (37.3 C) (Axillary)    Resp (!) 42    Ht 5\' 5"  (1.651 m)    Wt (!) 162.3 kg    SpO2 95%    BMI 59.54 kg/m   General: Awake, oriented to person, place, president.  Unable to tell year.  Patient appears to be much more coherent than this morning.  Able to hear and understand.  Unsure how much is voluntary versus involuntary, if any. Respiratory: SpO2 92-100 on BiPAP.  Appears to be in only mild distress. Extremities: Moving all extremities spontaneously.  Can wiggle toes on command.   A/P: -Order ABG to determine if she is blowing off CO2 with BiPAP -Order sitter to assist nurse with mask replacement after pull off -Close follow-up  Ezequiel Essex, MD 05/25/2020, 2:46 PM PGY-1, [place] Medicine Service pager [phone]

## 2020-05-25 NOTE — Progress Notes (Signed)
Went to see Pt.  Pt is looking better than yesterday. Pt has BIPAP in place, the mask kept on going up and Pt was trying to pull it down. Pt is talking normally oriented to place and person but not to time. When asked about what month and year it is? Pt states "I don't know". She denies any SOB, Chest pain. She denies any other complaints. Her O2 saturation is 100% with BIPAP with no leaks. Sitter is there to watch her overnight. Nurse was gone to bring some medications. Left a message with the sitter to tell the nurse to adjust her mask so that Pt doesn't have to pull it down.   Dr. Armando Reichert MD PGY1 [place]

## 2020-05-26 ENCOUNTER — Telehealth: Payer: Self-pay

## 2020-05-26 ENCOUNTER — Telehealth: Payer: Medicare HMO

## 2020-05-26 ENCOUNTER — Inpatient Hospital Stay (HOSPITAL_COMMUNITY): Payer: Medicare HMO

## 2020-05-26 DIAGNOSIS — R0602 Shortness of breath: Secondary | ICD-10-CM | POA: Diagnosis not present

## 2020-05-26 DIAGNOSIS — R0600 Dyspnea, unspecified: Secondary | ICD-10-CM | POA: Diagnosis not present

## 2020-05-26 LAB — COMPREHENSIVE METABOLIC PANEL
ALT: 15 U/L (ref 0–44)
AST: 16 U/L (ref 15–41)
Albumin: 2.9 g/dL — ABNORMAL LOW (ref 3.5–5.0)
Alkaline Phosphatase: 85 U/L (ref 38–126)
Anion gap: 13 (ref 5–15)
BUN: 19 mg/dL (ref 8–23)
CO2: 40 mmol/L — ABNORMAL HIGH (ref 22–32)
Calcium: 8.8 mg/dL — ABNORMAL LOW (ref 8.9–10.3)
Chloride: 88 mmol/L — ABNORMAL LOW (ref 98–111)
Creatinine, Ser: 0.68 mg/dL (ref 0.44–1.00)
GFR, Estimated: >60 mL/min (ref >60)
Glucose, Bld: 91 mg/dL (ref 70–99)
Potassium: 2.9 mmol/L — ABNORMAL LOW (ref 3.5–5.1)
Sodium: 141 mmol/L (ref 135–145)
Total Bilirubin: 0.9 mg/dL (ref 0.3–1.2)
Total Protein: 7.1 g/dL (ref 6.5–8.1)

## 2020-05-26 LAB — CBC
HCT: 39.3 % (ref 36.0–46.0)
Hemoglobin: 11.4 g/dL — ABNORMAL LOW (ref 12.0–15.0)
MCH: 27.1 pg (ref 26.0–34.0)
MCHC: 29 g/dL — ABNORMAL LOW (ref 30.0–36.0)
MCV: 93.3 fL (ref 80.0–100.0)
Platelets: 478 10*3/uL — ABNORMAL HIGH (ref 150–400)
RBC: 4.21 MIL/uL (ref 3.87–5.11)
RDW: 16.1 % — ABNORMAL HIGH (ref 11.5–15.5)
WBC: 11.7 10*3/uL — ABNORMAL HIGH (ref 4.0–10.5)
nRBC: 0 % (ref 0.0–0.2)

## 2020-05-26 MED ORDER — DILTIAZEM HCL ER COATED BEADS 120 MG PO CP24
120.0000 mg | ORAL_CAPSULE | Freq: Every day | ORAL | Status: DC
  Administered 2020-05-26 – 2020-05-29 (×4): 120 mg via ORAL
  Filled 2020-05-26 (×4): qty 1

## 2020-05-26 MED ORDER — POTASSIUM CHLORIDE CRYS ER 20 MEQ PO TBCR
40.0000 meq | EXTENDED_RELEASE_TABLET | Freq: Two times a day (BID) | ORAL | Status: AC
  Administered 2020-05-26 (×2): 40 meq via ORAL
  Filled 2020-05-26 (×2): qty 2

## 2020-05-26 MED ORDER — FUROSEMIDE 40 MG PO TABS
60.0000 mg | ORAL_TABLET | Freq: Every day | ORAL | Status: DC
  Administered 2020-05-26 – 2020-05-29 (×4): 60 mg via ORAL
  Filled 2020-05-26 (×4): qty 1

## 2020-05-26 MED ORDER — ENOXAPARIN SODIUM 300 MG/3ML IJ SOLN
160.0000 mg | Freq: Two times a day (BID) | INTRAMUSCULAR | Status: DC
  Filled 2020-05-26: qty 1.6

## 2020-05-26 MED ORDER — AMOXICILLIN-POT CLAVULANATE 875-125 MG PO TABS
1.0000 | ORAL_TABLET | Freq: Two times a day (BID) | ORAL | Status: DC
  Administered 2020-05-26 – 2020-05-28 (×5): 1 via ORAL
  Filled 2020-05-26 (×5): qty 1

## 2020-05-26 MED ORDER — APIXABAN 5 MG PO TABS
5.0000 mg | ORAL_TABLET | Freq: Two times a day (BID) | ORAL | Status: DC
  Administered 2020-05-26 – 2020-05-29 (×8): 5 mg via ORAL
  Filled 2020-05-26 (×8): qty 1

## 2020-05-26 MED ORDER — METOPROLOL TARTRATE 12.5 MG HALF TABLET
12.5000 mg | ORAL_TABLET | Freq: Two times a day (BID) | ORAL | Status: DC
  Administered 2020-05-26 – 2020-05-29 (×8): 12.5 mg via ORAL
  Filled 2020-05-26 (×8): qty 1

## 2020-05-26 NOTE — Progress Notes (Signed)
OT Cancellation Note  Patient Details Name: [name] White MRN: [id number] DOB: 1956-02-14   Cancelled Treatment:    Reason Eval/Treat Not Completed: Other (comment). Pt recently weaned from BiPaP to [place] with goal of obtaining MRI today, Will await MRI completion before attempting OT session.   Tyrone Schimke, OT Acute Rehabilitation Services Pager: [phone] Office: [phone]  05/26/2020, 1:12 PM

## 2020-05-26 NOTE — Telephone Encounter (Signed)
  Care Management     Care Management Outreach Note  05/26/2020 Name: PHILLIPA MORDEN MRN: [id number] DOB: [birth year]  JENAFER WINTERTON is a 64 y.o. year old female who is a primary care patient of Richarda Osmond, [place] will cancel  [facility]  call for today due to the patient has been admitted to the hospitl for Dyspnea unspecified.    Follow Up Plan: RNCM will follow the patient's progress  Lazaro Arms RN, BSN, [facility] Care Management Coordinator [place] Phone: [phone] Fax: [phone]

## 2020-05-26 NOTE — Care Management Important Message (Signed)
Important Message  Patient Details  Name: [name] White MRN: [id number] Date of Birth: 09-02-1955   Medicare Important Message Given:        Orbie Pyo 05/26/2020, 2:47 PM

## 2020-05-26 NOTE — Plan of Care (Signed)
  Problem: Education: Goal: Knowledge of General Education information will improve Description Including pain rating scale, medication(s)/side effects and non-pharmacologic comfort measures Outcome: Progressing   

## 2020-05-26 NOTE — TOC Progression Note (Signed)
Transition of Care [facility]) - Progression Note    Patient Details  Name: [name] White MRN: [id number] Date of Birth: 01-19-1956  Transition of Care [facility]) CM/SW [place], RN Phone Number: [phone]  05/26/2020, 2:32 PM  Clinical Narrative:    CM received call from [facility] with admissions at [place]. Rolla Plate states that the facility will have to retract their bed offer due to patient having order for sitter and documentation stating that patient is confused and removing her Bipap.   Expected Discharge Plan: [place] Barriers to Discharge: Continued Medical Work up, SNF Pending bed offer  Expected Discharge Plan and Services Expected Discharge Plan: [place] Choice: [place] Living arrangements for the past 2 months: Apartment                                       Social Determinants of Health (SDOH) Interventions    Readmission Risk Interventions Readmission Risk Prevention Plan 02/07/2020 07/22/2019 03/08/2019  Transportation Screening Complete Complete Complete  Medication Review Press photographer) Complete Complete Complete  PCP or Specialist appointment within 3-5 days of discharge Complete Complete Complete  HRI or Home Care Consult Complete Complete Complete  SW Recovery Care/Counseling Consult Complete Complete Complete  Palliative Care Screening Complete Not Applicable Not [place] Complete Not Applicable Not Applicable  Some recent data might be hidden

## 2020-05-26 NOTE — Plan of Care (Signed)
Patient pending MRI brain. Currently not stable from a cardiopulmonary standpoint to be able to get MRI. Will follow MRI when done. -- Amie Portland, MD [facility] Pager: [phone]

## 2020-05-26 NOTE — Progress Notes (Signed)
Pt placed on BIPAP for night

## 2020-05-26 NOTE — Progress Notes (Signed)
MR brain incomplete due to patient cooperation but negative for acute stroke. No further neurological recommendations Please call with questions as needed.  Amie Portland MD Neurology

## 2020-05-26 NOTE — TOC Progression Note (Addendum)
Transition of Care [facility]) - Progression Note    Patient Details  Name: [name] White MRN: [id number] Date of Birth: 05-09-1956  Transition of Care [facility]) CM/SW [place], RN Phone Number: [phone]  05/26/2020, 1:36 PM  Clinical Narrative:    CM at bedside to make patient aware of bed offer from [facility]. Patient states that she is willing to go to [facility]. CM notified Caren Griffins in admissions at [facility] that patient will accept bed and will most likely be ready for d/c 12/2 per MD notes. CM attempted insurance authorization. This patient is not managed through [facility]. They are managed through regular [employer]  therefore the SNF gets authorization. Spoke with Caren Griffins to make [place] aware. TOC will continue to follow.    Expected Discharge Plan: [place] Barriers to Discharge: Continued Medical Work up, SNF Pending bed offer  Expected Discharge Plan and Services Expected Discharge Plan: [place] Choice: [place] Living arrangements for the past 2 months: Apartment                                       Social Determinants of Health (SDOH) Interventions    Readmission Risk Interventions Readmission Risk Prevention Plan 02/07/2020 07/22/2019 03/08/2019  Transportation Screening Complete Complete Complete  Medication Review Press photographer) Complete Complete Complete  PCP or Specialist appointment within 3-5 days of discharge Complete Complete Complete  HRI or Home Care Consult Complete Complete Complete  SW Recovery Care/Counseling Consult Complete Complete Complete  Palliative Care Screening Complete Not Applicable Not [place] Complete Not Applicable Not Applicable  Some recent data might be hidden

## 2020-05-26 NOTE — Progress Notes (Signed)
Family Medicine Teaching Service Daily Progress Note Intern Pager: [phone]  Patient name: [name] White Medical record number: [id number] Date of birth: 08-02-1955 Age: 64 y.o. Gender: female  Primary Care Provider: Richarda Osmond, DO Consultants: None Code Status: DNR  Pt Overview and Major Events to Date:  Admitted 11/24 Acute episode altered mental status 11/28  Assessment and Plan: [name] White a 64 y.o.femalepresenting with2-week progressive history of dyspnea, chest tightness, and increased oxygen requirement. PMH is significant forOHS,pulmonary hypertension,OSA, HFpEF, HTN, paroxysmal A. fib on Eliquis, T2DM,MDD, GERD, osteoarthritis, morbid obesity,remote tobacco use with 20-pack-year cigarette history.  Acute on chronic hypoxic/hypercapnic respiratory failure 2/2 OHS in setting of diastolic HF Mental status much improved this morning, ready yesterday.  Interacting normally, can follow commands, nods understanding.  Sleepy but easily awoken.  S/p acute episode of altered mental status 11/28, believed to be due to CO2 retention 2/2 nonadherence with BiPAP overnight.  Has been improving with continuous BiPAP in place.  -Work with RT, attempt to wean to nasal cannula today -Obtain MRI head as soon as weaned from BiPAP -BiPAP nightly going forward -Continue PT/OT -Plan for SNF placement at discharge  Headache: Improving No complains of headache today. S/p Toradol x1 11/28.  Due to possible sinus infection, started amoxicillin 500 3 times daily 11/28. -Day #3 amoxicillin 500 TID x 5d (11/28 - 12/2)  OHSOSA Altered mental status as above. Home O2 level of 5 to 6 L.Home meds include albuterol inhaler and DuoNebs as needed as well as Trelegy and home O2. -Continue BiPAP overnight -Continue home medications -Monitor respiratory status -Incentive spirometry every 4 hours  HFpEFHypertension:Chronic, stable Home medications includeatorvastatin,  Cardizem, metoprolol.Initially hypertensive in the ED, but once admitted normotensive.  Normotensive last 24 hours.    BPs 96-129/59-89 with pulses 87/115. -Continue home meds -Declineto consult cardiology at this time,may in future if clinically indicated  Asymmetric BLE edema:Chronic, stable Noted on admission physical exam,L>R.Given tachypnea and tachycardia, obtained CTA PE chest; negative for PE. -Continue to monitor -Ambulate patient -Activity as tolerated -Elevate legs  Paroxysmal A. fib on Eliquis:Chronic, stable At home takesEliquis 5 mg twice daily, Cardizem, flecainide. -Continuous telemetry 24 to 48 hours -Continue home medications  Type 2 diabetes mellitus BG today is91.Glucose at admission 99.No current home meds, appears to be controlled by diet.Hemoglobin A1c 11/24 was 6.4 -Monitor BMPs daily.  Thrombocytosis, chronic Platelets 504 on admission.  PLT 478 today. -Continue to Monitor   Normocytic anemia: Chronic Today hemoglobin 11.4 with MCV 93.3.  MDD: Chronic, stable Home meds includevenlafaxine total dose 187.5 mg daily. -Continue home med.  Anxiety -Continue home hydroxyzine  GERD: Chronic, stable Home meds includeomeprazole 20 mg daily. -Protonix while inpatient  Osteoarthritis: Chronic, stable Home meds includegabapentin 200 mg 3 times daily. -Continue home med  FEN/GI:Regular diet PPx:On Eliquis   Status is: Inpatient  Remains inpatient appropriate because:Altered mental status, IV treatments appropriate due to intensity of illness or inability to take PO and Inpatient level of care appropriate due to severity of illness   Dispo:  Patient From: [place]  Planned Disposition: [place]  Expected discharge date: 05/28/20  Medically stable for discharge: No   Subjective:  Patient resting comfortably in bed this morning with sitter at the bedside.  She is wearing a BiPAP mask.   Sitter, there since 0700, reports that she keeps readjusting her mask because it rides up on her face.  Patient has no complaints, says that she is simply tired because  she did not get a lot of sleep last night.  Understands a plan for head imaging today, no questions.  Objective: Temp:  [98 F (36.7 C)-99.7 F (37.6 C)] 99.7 F (37.6 C) (11/30 0521) Pulse Rate:  [87-115] 101 (11/30 0800) Resp:  [16-42] 21 (11/30 0800) BP: (96-129)/(59-105) 112/89 (11/30 0521) SpO2:  [93 %-100 %] 97 % (11/30 0800) FiO2 (%):  [40 %] 40 % (11/30 0759)  Physical Exam: General: Sleepy but easily awoken, oriented, no acute distress Cardiovascular: Regular rate and rhythm but difficult to auscultate due to body habitus and BiPAP Respiratory: Clear to auscultation bilaterally, small-volume air movement due to poor patient effort Extremities: No BLE edema, can easily wiggle toes on command  Laboratory: Recent Labs  Lab 05/22/20 0937 05/25/20 0039 05/26/20 0550  WBC 10.4 10.8* 11.7*  HGB 10.7* 10.3* 11.4*  HCT 37.9 37.7 39.3  PLT 518* 452* 478*   Recent Labs  Lab 05/20/20 1353 05/20/20 1353 05/21/20 0248 05/22/20 0937 05/24/20 0134 05/25/20 0039 05/26/20 0550  NA 141   < > 141   < > 140 141 141  K 3.7   < > 4.0   < > 4.3 4.8 2.9*  CL 95*   < > 94*   < > 88* 88* 88*  CO2 34*   < > 38*   < > 43* 44* 40*  BUN 8   < > 10   < > 12 18 19   CREATININE 0.49   < > 0.57   < > 0.61 0.49 0.68  CALCIUM 9.0   < > 9.2   < > 8.6* 8.5* 8.8*  PROT 8.1  --  8.0  --   --   --  7.1  BILITOT 0.4  --  0.4  --   --   --  0.9  ALKPHOS 95  --  99  --   --   --  85  ALT 17  --  19  --   --   --  15  AST 21  --  19  --   --   --  16  GLUCOSE 99   < > 157*   < > 132* 100* 91   < > = values in this interval not displayed.     Imaging/Diagnostic Tests: No results found.   Ezequiel Essex, MD 05/26/2020, 8:27 AM PGY-1, [place] Intern pager: [phone], text pages welcome

## 2020-05-27 DIAGNOSIS — R0602 Shortness of breath: Secondary | ICD-10-CM | POA: Diagnosis not present

## 2020-05-27 DIAGNOSIS — R0600 Dyspnea, unspecified: Secondary | ICD-10-CM | POA: Diagnosis not present

## 2020-05-27 LAB — BASIC METABOLIC PANEL
Anion gap: 11 (ref 5–15)
BUN: 16 mg/dL (ref 8–23)
CO2: 35 mmol/L — ABNORMAL HIGH (ref 22–32)
Calcium: 8.5 mg/dL — ABNORMAL LOW (ref 8.9–10.3)
Chloride: 91 mmol/L — ABNORMAL LOW (ref 98–111)
Creatinine, Ser: 0.62 mg/dL (ref 0.44–1.00)
GFR, Estimated: >60 mL/min (ref >60)
Glucose, Bld: 121 mg/dL — ABNORMAL HIGH (ref 70–99)
Potassium: 3.3 mmol/L — ABNORMAL LOW (ref 3.5–5.1)
Sodium: 137 mmol/L (ref 135–145)

## 2020-05-27 LAB — CBC
HCT: 35.7 % — ABNORMAL LOW (ref 36.0–46.0)
Hemoglobin: 10.8 g/dL — ABNORMAL LOW (ref 12.0–15.0)
MCH: 27.5 pg (ref 26.0–34.0)
MCHC: 30.3 g/dL (ref 30.0–36.0)
MCV: 90.8 fL (ref 80.0–100.0)
Platelets: 435 10*3/uL — ABNORMAL HIGH (ref 150–400)
RBC: 3.93 MIL/uL (ref 3.87–5.11)
RDW: 16.1 % — ABNORMAL HIGH (ref 11.5–15.5)
WBC: 11.7 10*3/uL — ABNORMAL HIGH (ref 4.0–10.5)
nRBC: 0 % (ref 0.0–0.2)

## 2020-05-27 MED ORDER — IPRATROPIUM-ALBUTEROL 0.5-2.5 (3) MG/3ML IN SOLN
3.0000 mL | Freq: Two times a day (BID) | RESPIRATORY_TRACT | Status: DC
  Administered 2020-05-27 – 2020-05-29 (×4): 3 mL via RESPIRATORY_TRACT
  Filled 2020-05-27 (×4): qty 3

## 2020-05-27 MED ORDER — INFLUENZA VAC A&B SA ADJ QUAD 0.5 ML IM PRSY: 0.5000 mL | PREFILLED_SYRINGE | INTRAMUSCULAR | Status: DC

## 2020-05-27 MED ORDER — POTASSIUM CHLORIDE CRYS ER 20 MEQ PO TBCR
20.0000 meq | EXTENDED_RELEASE_TABLET | Freq: Every day | ORAL | Status: DC
  Administered 2020-05-28 – 2020-05-29 (×2): 20 meq via ORAL
  Filled 2020-05-27 (×2): qty 1

## 2020-05-27 MED ORDER — INFLUENZA VAC A&B SA ADJ QUAD 0.5 ML IM PRSY
0.5000 mL | PREFILLED_SYRINGE | INTRAMUSCULAR | Status: AC
  Administered 2020-05-29: 0.5 mL via INTRAMUSCULAR
  Filled 2020-05-27: qty 0.5

## 2020-05-27 MED ORDER — POTASSIUM CHLORIDE CRYS ER 20 MEQ PO TBCR
40.0000 meq | EXTENDED_RELEASE_TABLET | Freq: Two times a day (BID) | ORAL | Status: AC
  Administered 2020-05-27 (×2): 40 meq via ORAL
  Filled 2020-05-27 (×2): qty 2

## 2020-05-27 NOTE — Progress Notes (Signed)
Physical Therapy Treatment Patient Details Name: [name] White MRN: [id number] DOB: July 12, 1955 Today's Date: 05/27/2020    History of Present Illness 64 y.o. female presenting with 2-week progressive history of dyspnea, chest tightness, and increased oxygen requirement. Pt found to have decreased responsiveness on 11/28 and waiting for MRI. PMH is significant for OHS OSA, HFpEF, HTN, paroxysmal A. fib on Eliquis, T2DM, MDD, GERD, osteoarthritis, morbid obesity, remote tobacco use with 20-pack-year cigarette history.    PT Comments    Pt is progressing with mobility, she transferred bed to recliner with RW with min assist. SaO2 87% on 5L O2 HFNC with activity, 94% after 1 minute rest on 5L O2,  HR 122 max with activity. Instructed pt in seated UE/LE strengthening exercises.   Follow Up Recommendations  SNF;Supervision for mobility/OOB     Equipment Recommendations  None recommended by PT    Recommendations for Other Services       Precautions / Restrictions Precautions Precautions: Fall Precaution Comments: monitor SpO2 Restrictions Weight Bearing Restrictions: No    Mobility  Bed Mobility Overal bed mobility: Needs Assistance Bed Mobility: Supine to Sit     Supine to sit: Mod assist;HOB elevated     General bed mobility comments: assist to elevate trunk  Transfers Overall transfer level: Needs assistance Equipment used: Rolling walker (2 wheeled) Transfers: Sit to/from [facility] Sit to Stand: Min assist Stand pivot transfers: Min assist       General transfer comment: VCs hand placement, SaO2 87% on 5L with activity, HR 122 max  Ambulation/Gait                 Stairs             Wheelchair Mobility    Modified Rankin (Stroke Patients Only)       Balance Overall balance assessment: Needs assistance Sitting-balance support: No upper extremity supported;Feet supported Sitting balance-Leahy Scale: Good     Standing  balance support: Single extremity supported;During functional activity Standing balance-Leahy Scale: Poor Standing balance comment: reliant on UE support for stability                            Cognition Arousal/Alertness: Awake/alert Behavior During Therapy: WFL for tasks assessed/performed Overall Cognitive Status: Within Functional Limits for tasks assessed                                        Exercises General Exercises - Lower Extremity Ankle Circles/Pumps: AROM;Both;15 reps;Seated Long Arc Quad: AROM;Both;10 reps;Seated Hip Flexion/Marching: AROM;Both;10 reps;Supine Shoulder Exercises Pendulum Exercise: AROM;Both;5 reps;Seated    General Comments        Pertinent Vitals/Pain      Home Living                      Prior Function            PT Goals (current goals can now be found in the care plan section) Acute Rehab PT Goals Patient Stated Goal: To go to rehab and be able to walk around her house and regain independence PT Goal Formulation: With patient Time For Goal Achievement: 06/04/20 Potential to Achieve Goals: Fair Progress towards PT goals: Progressing toward goals    Frequency    Min 3X/week (3x/wk despite SNF, highly motivated)      PT Plan  Co-evaluation              AM-PAC PT "6 Clicks" Mobility   Outcome Measure  Help needed turning from your back to your side while in a flat bed without using bedrails?: A Little Help needed moving from lying on your back to sitting on the side of a flat bed without using bedrails?: A Little Help needed moving to and from a bed to a chair (including a wheelchair)?: A Little Help needed standing up from a chair using your arms (e.g., wheelchair or bedside chair)?: A Little Help needed to walk in hospital room?: A Lot Help needed climbing 3-5 steps with a railing? : Total 6 Click Score: 15    End of Session Equipment Utilized During Treatment:  Oxygen Activity Tolerance: Patient tolerated treatment well Patient left: in chair;with call bell/phone within reach;with chair alarm set Nurse Communication: Mobility status PT Visit Diagnosis: Other abnormalities of gait and mobility (R26.89);Muscle weakness (generalized) (M62.81)     Time: 2542-7062 PT Time Calculation (min) (ACUTE ONLY): 19 min  Charges:  $Therapeutic Activity: 8-22 mins                     Blondell Reveal Kistler PT 05/27/2020  Acute Rehabilitation Services Pager [phone] Office [phone]

## 2020-05-27 NOTE — Discharge Summary (Addendum)
[facility] Discharge Summary  Patient name: [name] White Medical record number: [id number] Date of birth: 1955-12-30 Age: 64 y.o. Gender: female Date of Admission: 05/20/2020  Date of Discharge: 05/29/2020 Admitting Physician: Lenoria Chime, MD  Primary Care Provider: Richarda Osmond, DO Consultants: Pulmonology  Indication for Hospitalization: Acute hypoxic hypercapnic respiratory failure 2/2 OHS  Discharge Diagnoses/Problem List:  Obesity hypoventilation syndrome Pulmonary hypertension HFpEF Obstructive sleep apnea Paroxysmal A. fib on Eliquis Asymmetric BLE edema Type 2 diabetes mellitus Chronic lumbar cytosis Chronic normocytic anemia Major depressive disorder Anxiety GERD Osteoarthritis  Disposition: SNF  Discharge Condition: Stable  Discharge Exam:  Temp:  [98.3 F (36.8 C)] 98.3 F (36.8 C) (12/02 2200) Pulse Rate:  [88-94] 94 (12/03 0558) Resp:  [17-31] 17 (12/03 0558) BP: (111)/(66) 111/66 (12/02 2200) SpO2:  [96 %-100 %] 100 % (12/03 0558) FiO2 (%):  [40 %] 40 % (12/02 2200)   Physical Exam: General: Awake, alert, oriented, no acute distress Respiratory: Normal work of breathing, no respiratory distress Extremities: Able to move all spontaneously Neuro: Cranial nerves II through X grossly intact  Brief Hospital Course:  Pt Overview and Major Events to Date:  Admitted 11/24   Assessment and Plan: [name] White is a 64 y.o. female presenting with 2-week progressive history of dyspnea, chest tightness, and increased oxygen requirement. PMH is significant for OHS, pulmonary hypertension, OSA, HFpEF, HTN, paroxysmal A. fib on Eliquis, T2DM, MDD, GERD, osteoarthritis, morbid obesity, remote tobacco use with 20-pack-year cigarette history.   Acute on chronic hypoxic/hypercapnic respiratory failure 2/2 OHS  Patient arrived to the ED via EMS, she called over concern for worsening dyspnea.  In transport, received CPAP,  Solu-Medrol, magnesium.  SPO2 in ED on nonrebreather in the 70s. S/p Lasix 60 mg IV once and Tylenol in the ED.  After admission to the floor, SpO2 > 96% on 10 L via HFNC. Patient is on 5-6L at home at baseline. Admission BNP 80.0, troponin 9.  This is her typical presentation and has had multiple similar admissions in the past. Patient weaned down to home levels of O2 (5-6L) by 11/27. On 11/26, patient insisted on solu-medrol prn, stating is was the only thing that has helped her with the chest tightness in the past. After extensive discussion on risks and benefits, including the fact that no diagnostic pulmonary testing this far has shown her to have either COPD or asthma, patient agreed to prednisone 20 mg 3-day burst. Patient progressed well until acute event afternoon of 11/28.  Patient was stable and back to baseline that morning for prerounds, plan was to discharge to SNF as soon as bed placement available.  In the early afternoon, patient was found by rounding attending to have altered mental status with broken speech.  Code stroke was called, emergent CT scan negative and ABG demonstrated marked respiratory acidosis with pH of 7.17 and pCO2 >120.  Patient was placed on BiPAP with subsequently improving ABGs.  Patient stayed on BiPAP for approximately 36 hours, was weaned to home O2 of 6 L via nasal cannula the morning of 11/30.  Subsequent MRI of her head, while limited by patient tolerance, was found to be negative.  This event is believed to be caused by inconsistent BiPAP use for the previous four nights while inpatient leading to CO2 retainment above her baseline retainment due to OHS. On day of discharge, patient was breathing comfortably on 5L O2 with SpO2 98%. Discharged to SNF with plan for PCP  follow up.    OHS  pulmonary hypertension  OSA Home O2 level of 5 to 6 L. Home meds include albuterol inhaler and DuoNebs as needed as well as Trelegy and home O2. No changes to medication at this time.  Discharged to SNF as above. Plan to resume home medications upon discharge. No new medications.      Issues for Follow Up:  1. Continue to retitrate trilogy outpatient, will place DME order for new unit 2. Repeat outpatient pulmonary function testing, as last known done 1 year ago 3. Consider reducing home oxygen supplementation, may need SPO2 goal 88-92% due to OHS 4. Patient will need outpatient recheck of her Hgb, given the normocytic anemia.  5. DME orders sent for new Trilogy BiPap machine and bariatric bedside commode. 6. Patient received 5 days of augmentin for perceived sinus infection (after much discussion). Augmentin discontinued before discharge. Follow up on symptoms.   Significant Procedures: None  Significant Labs and Imaging:  Recent Labs  Lab 05/25/20 0039 05/26/20 0550 05/27/20 0203  WBC 10.8* 11.7* 11.7*  HGB 10.3* 11.4* 10.8*  HCT 37.7 39.3 35.7*  PLT 452* 478* 435*   Recent Labs  Lab 05/25/20 0039 05/25/20 0039 05/26/20 0550 05/26/20 0550 05/27/20 0203 05/27/20 0203 05/28/20 0329 05/29/20 0455  NA 141  --  141  --  137  --  138 137  K 4.8   < > 2.9*   < > 3.3*   < > 3.9 3.9  CL 88*  --  88*  --  91*  --  94* 93*  CO2 44*  --  40*  --  35*  --  35* 33*  GLUCOSE 100*  --  91  --  121*  --  108* 111*  BUN 18  --  19  --  16  --  12 12  CREATININE 0.49  --  0.68  --  0.62  --  0.60 0.62  CALCIUM 8.5*  --  8.8*  --  8.5*  --  8.5* 8.7*  MG 1.8  --   --   --   --   --   --   --   PHOS 3.7  --   --   --   --   --   --   --   ALKPHOS  --   --  85  --   --   --   --   --   AST  --   --  16  --   --   --   --   --   ALT  --   --  15  --   --   --   --   --   ALBUMIN  --   --  2.9*  --   --   --   --   --    < > = values in this interval not displayed.    Results/Tests Pending at Time of Discharge: None  Discharge Medications:  Allergies as of 05/29/2020      Reactions   Caffeine Other (See Comments)   Migraine   Hydralazine Hcl Other (See  Comments)   AKI leading to rhabdo and electrolyte abnormalities   Hydrocodone Nausea And Vomiting   Headache, also   Ciprofloxacin Hives, Rash   Erythromycin Hives, Rash   Lisinopril Cough   Oxycodone Nausea And Vomiting, Other (See Comments)   Headaches, also   Sulfamethoxazole-trimethoprim Rash   Tape  Rash, Other (See Comments)      Medication List    STOP taking these medications   cyclobenzaprine 5 MG tablet Commonly known as: FLEXERIL     TAKE these medications   acetaminophen 325 MG tablet Commonly known as: TYLENOL Take 2 tablets (650 mg total) by mouth every 6 (six) hours as needed for mild pain. What changed: reasons to take this   albuterol 108 (90 Base) MCG/ACT inhaler Commonly known as: VENTOLIN HFA Inhale 2 puffs into the lungs every 4 (four) hours as needed for wheezing or shortness of breath.   apixaban 5 MG Tabs tablet Commonly known as: ELIQUIS Take 1 tablet (5 mg total) by mouth 2 (two) times daily.   atorvastatin 40 MG tablet Commonly known as: LIPITOR Take 1 tablet (40 mg total) by mouth daily. What changed: when to take this   azelastine 0.05 % ophthalmic solution Commonly known as: OPTIVAR INSTILL 1 DROP IN BOTH EYES TWICE DAILY AS NEEDED FOR ITCHY EYES   cetirizine 10 MG tablet Commonly known as: ZYRTEC TAKE 1 TABLET(10 MG) BY MOUTH DAILY What changed: See the new instructions.   diclofenac sodium 1 % Gel Commonly known as: VOLTAREN APPLY 2 GRAMS EXTERNALLY TO THE AFFECTED AREA FOUR TIMES DAILY What changed: See the new instructions.   diltiazem 120 MG 24 hr capsule Commonly known as: CARDIZEM CD TAKE 1 CAPSULE BY MOUTH ONCE DAILY   flecainide 100 MG tablet Commonly known as: TAMBOCOR TAKE 1 TABLET BY MOUTH TWICE DAILY   fluticasone 50 MCG/ACT nasal spray Commonly known as: FLONASE Place 1 spray into both nostrils daily.   furosemide 40 MG tablet Commonly known as: LASIX Take 1.5 tablets (60 mg total) by mouth daily.    gabapentin 100 MG capsule Commonly known as: NEURONTIN Take 2 capsules (200 mg total) by mouth 3 (three) times daily.   hydrOXYzine 25 MG capsule Commonly known as: VISTARIL TAKE 1 CAPSULE(25 MG) BY MOUTH DAILY AS NEEDED FOR ANXIETY What changed: See the new instructions.   ipratropium 0.06 % nasal spray Commonly known as: ATROVENT Place 2 sprays into both nostrils 4 (four) times daily.   ipratropium-albuterol 0.5-2.5 (3) MG/3ML Soln Commonly known as: DUONEB Take 3 mLs by nebulization every 6 (six) hours as needed (shortness of breath, wheezing).   melatonin 3 MG Tabs tablet Take 1 tablet (3 mg total) by mouth at bedtime as needed (sleep).   metoprolol tartrate 25 MG tablet Commonly known as: LOPRESSOR TAKE 1/2 TABLET(12.5 MG) BY MOUTH TWICE DAILY What changed: See the new instructions.   multivitamin tablet Take 1 tablet by mouth every evening.   omeprazole 20 MG capsule Commonly known as: PRILOSEC Take 1 capsule (20 mg total) by mouth daily.   OXYGEN Inhale 6-7 L/min into the lungs continuous.   polyethylene glycol 17 g packet Commonly known as: MIRALAX / GLYCOLAX Take 17 g by mouth daily as needed for mild constipation or moderate constipation.   potassium chloride 10 MEQ tablet Commonly known as: KLOR-CON TAKE 2 TABLETS(20 MEQ) BY MOUTH DAILY What changed: See the new instructions.   PRESCRIPTION MEDICATION See admin instructions. Trilogy [employer]-  At bedtime and during any time of rest   venlafaxine XR 150 MG 24 hr capsule Commonly known as: EFFEXOR-XR Take 1 capsule (150 mg total) by mouth daily with breakfast. Take with a 37.5 mg tablet every morning What changed: additional instructions   venlafaxine 37.5 MG tablet Commonly known as: EFFEXOR Take 1 tablet (37.5  mg total) by mouth daily with breakfast. Take with a 150mg  ER capsule every morning for a total of 187.5mg  What changed: Another medication with the same name was changed.  Make sure you understand how and when to take each.            Durable Medical Equipment  (From admission, onward)         Start     Ordered   05/28/20 1059  For home use only DME Bedside commode  Once       Comments: Patient needs bariatric bedside commode for use at home.  Question Answer Comment  Patient needs a bedside commode to treat with the following condition Shortness of breath   Patient needs a bedside commode to treat with the following condition Obesity hypoventilation syndrome ([place])   Patient needs a bedside commode to treat with the following condition Pulmonary hypertension ([place])   Patient needs a bedside commode to treat with the following condition Physical deconditioning      05/28/20 1059   05/28/20 1058  For home use only DME Bipap  Once       Comments: Patient needs new Trilogy machine for auto-BiPAP nightly while asleep.  Question:  Length of Need  Answer:  Lifetime   05/28/20 1059          Discharge Instructions: Please refer to Patient Instructions section of EMR for full details.  Patient was counseled important signs and symptoms that should prompt return to medical care, changes in medications, dietary instructions, activity restrictions, and follow up appointments.   Follow-Up Appointments:  Follow-up Information    Ouida Sills, Chelsey L, DO. Go on 06/05/2020.   Specialty: Family Medicine Why: Attend your hospital follow up appointment with Dr. Ermalene Searing on Friday 12/10 at 8:15am. Please arrive 15 minutes early to your appointment.  Contact information: [address] [place] [address] [phone]               Ezequiel Essex, MD 05/29/2020, 3:08 PM PGY-1, [place]

## 2020-05-27 NOTE — Care Management Important Message (Signed)
Important Message  Patient Details  Name: [name] White MRN: [id number] Date of Birth: June 06, 1956   Medicare Important Message Given:  Yes     Orbie Pyo 05/27/2020, 9:53 AM

## 2020-05-27 NOTE — Progress Notes (Signed)
Family Medicine Teaching Service Daily Progress Note Intern Pager: [phone]  Patient name: [name] White Medical record number: [id number] Date of birth: 05/02/1956 Age: 64 y.o. Gender: female  Primary Care Provider: Richarda Osmond, DO Consultants: None Code Status: DNR  Pt Overview and Major Events to Date:  Admitted 11/24 Acute episode altered mental status 11/28  Assessment and Plan: [name] White a 64 y.o.femalepresenting with2-week progressive history of dyspnea, chest tightness, and increased oxygen requirement. PMH is significant forOHS,pulmonary hypertension,OSA, HFpEF, HTN, paroxysmal A. fib on Eliquis, T2DM,MDD, GERD, osteoarthritis, morbid obesity,remote tobacco use with 20-pack-year cigarette history.  Acute on chronic hypoxic/hypercapnic respiratory failure 2/2 OHS in setting of diastolic HF Patient back to baseline, reports that she feels much better.  Wore BiPAP overnight without issue. S/p acute episode of altered mental status 11/28, believed to be due to CO2 retention 2/2 nonadherence with BiPAP overnight.  Head CT and MRI negative. Has been improving with continuous BiPAP in place.   Weaned to home level of oxygen at 6 L via nasal cannula 11/30. -BiPAP nightly going forward -Continue PT/OT -Working towards SNF placement  Headache: Improving Reports no headache this morning. S/p Toradol x1 11/28. Due to possible sinus infection, started amoxicillin 500 3 times daily 11/28. -Day #4amoxicillin 500 TID x 5d (11/28 - 12/2)  OHS OSA Altered mental status as above.Home O2 level of 5 to 6 L.Home meds include albuterol inhaler and DuoNebs as needed as well as Trelegy and home O2. -Continue BiPAP overnight -Continue home medications -Monitor respiratory status -Incentive spirometry every 4 hours  HFpEF Hypertension:Chronic, stable Home medications includeatorvastatin, Cardizem, metoprolol.Initially hypertensive in the ED, but once  admitted normotensive.Normotensive last 24 hours.   BPs 103-110/53-23, last 103/74 with pulse 108. -Continue home meds -Declineto consult cardiology at this time,may in future if clinically indicated  Asymmetric BLE edema:Chronic, stable Noted on admission physical exam,L>R.Given tachypnea and tachycardia, obtained CTA PE chest; negative for PE. -Continue to monitor -Ambulate patient -Activity as tolerated -Elevate legs  Paroxysmal A. fib on Eliquis:Chronic, stable At home takesEliquis 5 mg twice daily, Cardizem, flecainide. -Continuous telemetry 24 to 48 hours -Continue home medications  Type 2 diabetes mellitus BG today is91.Glucose at admission 99.No current home meds, appears to be controlled by diet.Hemoglobin A1c 11/24 was 6.4 -Monitor BMPs daily.  Thrombocytosis, chronic Platelets 504 on admission.  PLT 478 today. -Continue to Monitor   Normocytic anemia: Chronic Today hemoglobin 11.4 with MCV 93.3.  MDD: Chronic, stable Home meds includevenlafaxine total dose 187.5 mg daily. -Continue home med.  Anxiety -Continue home hydroxyzine  GERD: Chronic, stable Home meds includeomeprazole 20 mg daily. -Protonix while inpatient  Osteoarthritis: Chronic, stable Home meds includegabapentin 200 mg 3 times daily. -Continue home med  FEN/GI:Regular diet PPx:On Eliquis   Status is: Inpatient  Remains inpatient appropriate because:Working towards SNF placement   Dispo:  Patient From: Home  Planned Disposition: [place]  Expected discharge date: 05/28/20  Medically stable for discharge: No    Subjective:  Patient was found sitting up in bed this morning watching TV.  Appears at her baseline in good spirits.  She reports feeling much better and that she got a restful night sleep last night with her BiPAP.  Reports no issues wearing BiPAP.  Has no complaints.  Objective: Temp:  [98 F (36.7 C)-98.3 F (36.8 C)]  98.3 F (36.8 C) (12/01 0838) Pulse Rate:  [103-108] 108 (11/30 2122) Resp:  [20-26] 24 (12/01 0754) BP: (103-117)/(60-93) 117/60 (12/01 0754) SpO2:  [  95 %-98 %] 97 % (12/01 0806) FiO2 (%):  [40 %] 40 % (11/30 2000)  Physical Exam: General: Awake, alert, oriented, no acute distress Cardiovascular: Regular rate and rhythm, S1 and S2 auscultated, no murmurs appreciated Respiratory: Clear to auscultation bilaterally Extremities: No BLE edema  Laboratory: Recent Labs  Lab 05/25/20 0039 05/26/20 0550 05/27/20 0203  WBC 10.8* 11.7* 11.7*  HGB 10.3* 11.4* 10.8*  HCT 37.7 39.3 35.7*  PLT 452* 478* 435*   Recent Labs  Lab 05/20/20 1353 05/20/20 1353 05/21/20 0248 05/22/20 0937 05/25/20 0039 05/26/20 0550 05/27/20 0203  NA 141   < > 141   < > 141 141 137  K 3.7   < > 4.0   < > 4.8 2.9* 3.3*  CL 95*   < > 94*   < > 88* 88* 91*  CO2 34*   < > 38*   < > 44* 40* 35*  BUN 8   < > 10   < > 18 19 16   CREATININE 0.49   < > 0.57   < > 0.49 0.68 0.62  CALCIUM 9.0   < > 9.2   < > 8.5* 8.8* 8.5*  PROT 8.1  --  8.0  --   --  7.1  --   BILITOT 0.4  --  0.4  --   --  0.9  --   ALKPHOS 95  --  99  --   --  85  --   ALT 17  --  19  --   --  15  --   AST 21  --  19  --   --  16  --   GLUCOSE 99   < > 157*   < > 100* 91 121*   < > = values in this interval not displayed.    Imaging/Diagnostic Tests: MR BRAIN WO CONTRAST  Result Date: 05/26/2020 CLINICAL DATA:  Delirium. EXAM: MRI HEAD WITHOUT CONTRAST TECHNIQUE: Multiplanar, multiecho pulse sequences of the brain and surrounding structures were obtained without intravenous contrast. COMPARISON:  None. FINDINGS: Due to patient intolerance, the study was terminated early. Within this limitation: Brain: No acute infarction, hemorrhage, hydrocephalus, extra-axial collection or mass lesion. Vascular: Major arterial flow voids are maintained at the skull base. Skull and upper cervical spine: Normal marrow signal. Sinuses/Orbits: No substantial  paranasal sinus disease. Unremarkable orbits. Other: No mastoid effusions. IMPRESSION: Incomplete study due to patient intolerance. Within this limitation, no evidence of acute intracranial abnormality. No acute infarct. Electronically Signed   By: [name] Sheffield MD   On: 05/26/2020 16:47     [name] Essex, MD 05/27/2020, 9:27 AM PGY-1, [place] Intern pager: [phone], text pages welcome

## 2020-05-28 DIAGNOSIS — J9621 Acute and chronic respiratory failure with hypoxia: Secondary | ICD-10-CM | POA: Diagnosis not present

## 2020-05-28 LAB — BASIC METABOLIC PANEL
Anion gap: 9 (ref 5–15)
BUN: 12 mg/dL (ref 8–23)
CO2: 35 mmol/L — ABNORMAL HIGH (ref 22–32)
Calcium: 8.5 mg/dL — ABNORMAL LOW (ref 8.9–10.3)
Chloride: 94 mmol/L — ABNORMAL LOW (ref 98–111)
Creatinine, Ser: 0.6 mg/dL (ref 0.44–1.00)
GFR, Estimated: >60 mL/min (ref >60)
Glucose, Bld: 108 mg/dL — ABNORMAL HIGH (ref 70–99)
Potassium: 3.9 mmol/L (ref 3.5–5.1)
Sodium: 138 mmol/L (ref 135–145)

## 2020-05-28 MED ORDER — POLYETHYLENE GLYCOL 3350 17 G PO PACK
17.0000 g | PACK | Freq: Every day | ORAL | Status: DC
  Filled 2020-05-28: qty 1

## 2020-05-28 MED ORDER — DOCUSATE SODIUM 100 MG PO CAPS
100.0000 mg | ORAL_CAPSULE | Freq: Once | ORAL | Status: AC | PRN
  Administered 2020-05-28: 100 mg via ORAL
  Filled 2020-05-28: qty 1

## 2020-05-28 MED ORDER — BISACODYL 10 MG RE SUPP: 10.0000 mg | Freq: Once | RECTAL | Status: DC | PRN

## 2020-05-28 NOTE — Progress Notes (Signed)
Physical Therapy Treatment Patient Details Name: [name] White MRN: [id number] DOB: 1956-06-05 Today's Date: 05/28/2020    History of Present Illness 64 y.o. female presenting with 2-week progressive history of dyspnea, chest tightness, and increased oxygen requirement. Pt found to have decreased responsiveness on 11/28 and waiting for MRI. PMH is significant for OHS OSA, HFpEF, HTN, paroxysmal A. fib on Eliquis, T2DM, MDD, GERD, osteoarthritis, morbid obesity, remote tobacco use with 20-pack-year cigarette history.    PT Comments    Pt stood for ~75 seconds with RW, standing tolerance limited by B knee pain (pt reports chronic OA in knees), SaO2 98% on 5L O2 during standing. Pt unable to march in standing 2* BLE weakness. Performed seated BUE/LE exercises, pt tolerated increased repetitions. Pt requires rest between each trial of exercise 2* 2/4 dyspnea. VCs for pursed lip breathing.   Follow Up Recommendations  SNF;Supervision for mobility/OOB     Equipment Recommendations  None recommended by PT    Recommendations for Other Services       Precautions / Restrictions Precautions Precautions: Fall Precaution Comments: monitor SpO2 Restrictions Weight Bearing Restrictions: No    Mobility  Bed Mobility Overal bed mobility: Needs Assistance Bed Mobility: Supine to Sit     Supine to sit: Min guard;HOB elevated     General bed mobility comments: up in recliner  Transfers Overall transfer level: Needs assistance Equipment used: Rolling walker (2 wheeled)   Sit to Stand: Min assist (with RW for peri care) Stand pivot transfers:  (no DME, used furniture for support)       General transfer comment: VCs hand placement, SaO2 88% on 5L O2 with activity; pt stood with RW for ~75 seconds tolerance limited by B knee pain (pt reports h/o OA)  Ambulation/Gait                 Stairs             Wheelchair Mobility    Modified Rankin (Stroke Patients Only)        Balance Overall balance assessment: Needs assistance Sitting-balance support: No upper extremity supported;Feet supported Sitting balance-Leahy Scale: Good Sitting balance - Comments: able to demonstrate dynamic seated balance during bathing with no LOB   Standing balance support: Single extremity supported;During functional activity Standing balance-Leahy Scale: Poor Standing balance comment: reliant on UE support for stability                            Cognition Arousal/Alertness: Awake/alert Behavior During Therapy: WFL for tasks assessed/performed Overall Cognitive Status: Within Functional Limits for tasks assessed                                        Exercises General Exercises - Lower Extremity Ankle Circles/Pumps: AROM;Both;Seated;20 reps Long Arc Quad: AROM;Both;Seated;20 reps Hip Flexion/Marching: AROM;Both;10 reps;20 reps;Seated Shoulder Exercises Shoulder Flexion: AROM;Both;20 reps;Seated    General Comments        Pertinent Vitals/Pain Pain Assessment: Faces Faces Pain Scale: Hurts even more Pain Location: B knees and back in standing, pt reports 0/10 pain in sitting Pain Descriptors / Indicators: Aching;Grimacing Pain Intervention(s): Limited activity within patient's tolerance;Monitored during session;Repositioned    Home Living                      Prior Function  PT Goals (current goals can now be found in the care plan section) Acute Rehab PT Goals Patient Stated Goal: To go to rehab and be able to walk around her house and regain independence PT Goal Formulation: With patient Time For Goal Achievement: 06/04/20 Potential to Achieve Goals: Fair Progress towards PT goals: Progressing toward goals    Frequency    Min 2X/week (3x/wk despite SNF, highly motivated)      PT Plan Current plan remains appropriate    Co-evaluation              AM-PAC PT "6 Clicks" Mobility    Outcome Measure  Help needed turning from your back to your side while in a flat bed without using bedrails?: A Little Help needed moving from lying on your back to sitting on the side of a flat bed without using bedrails?: A Little Help needed moving to and from a bed to a chair (including a wheelchair)?: A Little Help needed standing up from a chair using your arms (e.g., wheelchair or bedside chair)?: A Little Help needed to walk in hospital room?: A Lot Help needed climbing 3-5 steps with a railing? : Total 6 Click Score: 15    End of Session Equipment Utilized During Treatment: Oxygen Activity Tolerance: Patient tolerated treatment well Patient left: in chair;with call bell/phone within reach;with chair alarm set Nurse Communication: Mobility status PT Visit Diagnosis: Other abnormalities of gait and mobility (R26.89);Muscle weakness (generalized) (M62.81)     Time: 8682-5749 PT Time Calculation (min) (ACUTE ONLY): 22 min  Charges:  $Therapeutic Activity: 8-22 mins                     Blondell Reveal Kistler PT 05/28/2020  Acute Rehabilitation Services Pager [phone] Office [phone]

## 2020-05-28 NOTE — Discharge Instructions (Signed)
Dear [name] White,  Thank you for letting us participate in your care. You were hospitalized for acute worsening difficulty breathing and diagnosed with acute hypoxic (low oxygen) hypercapnic (high CO2) respiratory failure due to an exacerbation of your established OHS (obesity hypoventilation syndrome). You were treated with increased supplemental oxygen and BiPAP. Based on previous multiple lung tests, you do not have asthma or COPD.   POST-HOSPITAL & CARE INSTRUCTIONS 1. Make sure to attend your hospital follow up appointment with your primary care doctor (PCP).  2. Wear your BIPAP every single night while asleep. This is very important in managing your OHS (obesity hypoventilation syndrome).  3. Go to your follow up appointments (listed below)   DOCTOR'S APPOINTMENT   Future Appointments  Date Time Provider [place]  06/05/2020  8:30 AM Richarda Osmond, DO [facility]    Follow-up Information    Doristine Mango L, DO. Go on 06/05/2020.   Specialty: Family Medicine Why: Attend your hospital follow up appointment with Dr. Ermalene Searing on Friday 12/10 at 8:15am. Please arrive 15 minutes early to your appointment.  Contact information: [address] [place] [address] [phone]               Take care and be well!  [facility]  [place], [place] [address] [phone]      Obesity Hypoventilation Syndrome  Obesity hypoventilation syndrome (OHS) means that you are not breathing well enough to get air in and out of your lungs efficiently (ventilation). This causes a low oxygen level and a high carbon dioxide level in your blood (hypoventilation). Having too much total body fat (obesity) is a significant risk factor for developing OHS. OHS makes it harder for your heart to pump oxygen-rich blood to your body. It can cause sleep disturbances and  make you feel sleepy during the day. Over time, OHS can increase your risk for:  Heart disease.  High blood pressure (hypertension).  Reduced ability to absorb sugar from the bloodstream (insulin resistance).  Heart failure. Over time, OHS weakens your heart and can lead to heart failure. What are the causes? The exact cause of OHS is not known. Possible causes include:  Pressure on the lungs from excess body weight.  Obesity-related changes in how much air the lungs can hold (lung capacity) and how much they can expand (lung compliance).  Failure of the brain to regulate oxygen and carbon dioxide levels properly.  Chemicals (hormones) produced by excess fat cells interfering with breathing regulation.  A breathing condition in which breathing pauses or becomes shallow during sleep (sleep apnea). This condition can eventually cause the body to ventilate poorly and to hold onto carbon dioxide during the day. What increases the risk? You may have a greater risk for OHS if you:  Have a BMI of 30 or higher. BMI is an estimate of body fat that is calculated from height and weight. For adults, a BMI of 30 or higher is considered obese.  Are 65 years old.  Carry most of your excess weight around your waist.  Experience moderate symptoms of sleep apnea. What are the signs or symptoms? The most common symptoms of OHS are:  Daytime sleepiness.  Lack of energy.  Shortness of breath.  Morning headaches.  Sleep apnea.  Trouble concentrating.  Irritability, mood swings, or depression.  Swollen veins in the neck.  Swelling of the legs. How  is this diagnosed? Your health care provider may suspect OHS if you are obese and have poor breathing during the day and at night. Your health care provider will also do a physical exam. You may have tests to:  Measure your BMI.  Measure your blood oxygen level with a sensor placed on your finger (pulse oximetry).  Measure blood oxygen  and carbon dioxide in a blood sample.  Measure the amount of red blood cells in a blood sample. OHS causes the number of red blood cells you have to increase (polycythemia).  Check your breathing ability (pulmonary function testing).  Check your breathing ability, breathing patterns, and oxygen level while you sleep (sleep study). You may also have a chest X-ray to rule out other breathing problems. You may have an electrocardiogram (ECG) and or echocardiogram to check for signs of heart failure. How is this treated? Weight loss is the most important part of treatment for OHS, and it may be the only treatment that you need. Other treatments may include:  Using a device to open your airway while you sleep, such as a continuous positive airway pressure (CPAP) machine that delivers oxygen to your airway through a mask.  Surgery (gastric bypass surgery) to lower your BMI. This may be needed if: ? You are very obese. ? Other treatments have not worked for you. ? Your OHS is very severe and is causing organ damage, such as heart failure. Follow these instructions at home:  Medicines  Take over-the-counter and prescription medicines only as told by your health care provider.  Ask your health care provider what medicines are safe for you. You may be told to avoid medicines that can impair breathing and make OHS worse, such as sedatives and narcotics. Sleeping habits  If you are prescribed a CPAP machine, make sure you understand and use the machine as directed.  Try to get 8 hours of sleep every night.  Go to bed at the same time every night, and get up at the same time every day. General instructions  Work with your health care provider to make a diet and exercise plan that helps you reach and maintain a healthy weight.  Eat a healthy diet.  Avoid smoking.  Exercise regularly as told by your health care provider.  During the evening, do not drink caffeine and do not eat heavy  meals.  Keep all follow-up visits as told by your health care provider. This is important. Contact a health care provider if:  You experience new or worsening shortness of breath.  You have chest pain.  You have an irregular heartbeat (palpitations).  You have dizziness.  You faint.  You develop a cough.  You have a fever.  You have chest pain when you breathe (pleurisy). This information is not intended to replace advice given to you by your health care provider. Make sure you discuss any questions you have with your health care provider. Document Revised: 10/05/2018 Document Reviewed: 11/23/2015 Elsevier Patient Education  [place].

## 2020-05-28 NOTE — Progress Notes (Signed)
Family Medicine Teaching Service Daily Progress Note Intern Pager: [phone]  Patient name: [name] White Medical record number: [id number] Date of birth: 10-05-1955 Age: 64 y.o. Gender: female  Primary Care Provider: Richarda Osmond, DO Consultants: None Code Status: DNR  Pt Overview and Major Events to Date: Admitted 11/24 Acute episode altered mental status 11/28  Assessment and Plan: [name] White a 64 y.o.femalepresenting with2-week progressive history of dyspnea, chest tightness, and increased oxygen requirement. PMH is significant forOHS,pulmonary hypertension,OSA, HFpEF, HTN, paroxysmal A. fib on Eliquis, T2DM,MDD, GERD, osteoarthritis, morbid obesity,remote tobacco use with 20-pack-year cigarette history.  Acute on chronic hypoxic/hypercapnic respiratory failure 2/2 OHS in setting of diastolic HF  Patient is in good spirits this morning, reports she feels wonderful. Again wore BiPAP overnight without issue.S/p acute episode of altered mental status 11/28, believed to be due to CO2 retention 2/2 nonadherence with BiPAP overnight.  Head CT and MRI negative.Has been improving with continuous BiPAP in place.  Weaned to home level of oxygen at 6 L via nasal cannula 11/30, has been stable since.  -BiPAP nightly going forward -Continue PT/OT -Working towards SNF placement, medically stable for discharge  Headache:Improving No headache this morning. S/p Toradol x111/28. Due to possible sinus infection, started amoxicillin 500 3 times daily11/28. -Last day of abx, Day #5amoxicillin 500 TID x 5d (11/28 - 12/2)  OHS OSA Altered mental status as above.Home O2 level of 5 to 6 L.Home meds include albuterol inhaler and DuoNebs as needed as well as Trelegy and home O2. -ContinueBiPAP overnight -Continue home medications -Monitor respiratory status -Incentive spirometry every 4 hours  HFpEF Hypertension:Chronic, stable Home medications  includeatorvastatin, Cardizem, metoprolol.Initially hypertensive in the ED, but once admitted normotensive.Normotensive last 24 hours.BPs 103-110/53-23, last 103/74 with pulse 108. -Continue home meds -Declineto consult cardiology at this time,may in future if clinically indicated  Asymmetric BLE edema:Chronic, stable Noted on admission physical exam,L>R.Given tachypnea and tachycardia, obtained CTA PE chest; negative for PE. -Continue to monitor -Ambulate patient -Activity as tolerated -Elevate legs  Paroxysmal A. fib on Eliquis:Chronic, stable At home takesEliquis 5 mg twice daily, Cardizem, flecainide. -Continue home medications  Type 2 diabetes mellitus BG today is108.Glucose at admission 99.No current home meds, appears to be controlled by diet.Hemoglobin A1c 11/24 was 6.4 -Monitor BMPs daily.  Thrombocytosis, chronic Platelets 504 on admission.PLT 478 yesterday.  -Continue to Monitor   Normocytic anemia: Chronic Last Hgb yesterday 11.7 with MCV 90.8.   MDD: Chronic, stable Home meds includevenlafaxine total dose 187.5 mg daily. -Continue home med.  Anxiety -Continue home hydroxyzine  GERD: Chronic, stable Home meds includeomeprazole 20 mg daily. -Protonix while inpatient  Osteoarthritis: Chronic, stable Home meds includegabapentin 200 mg 3 times daily. -Continue home med  FEN/GI:Regular diet PPx:On Eliquis   Status is: Inpatient  Remains inpatient appropriate because:pending SNF placement   Dispo:  Patient From: Home  Planned Disposition: [place]  Expected discharge date: 05/28/2020  Medically stable for discharge: Yes   Subjective:  Patient found sitting up in bed watching TV.  Patient appears to be in good spirits this morning.  She reports that she feels wonderful and is back to her baseline.  Reports that she did not realize how unwell she had felt before the BiPAP while inpatient.  Says that  now after the 36 hours of BiPAP with nightly BiPAP since she feels much better and her head feels much better.  She wonders if what she thought was a sinus infection was from lack of adequate  BiPAP.  She has been thinking and believes that her home trilogy machine which is supposed to provide BiPAP has been malfunctioning lately and not providing cool pressured air like it is supposed to.  Objective: Temp:  [98 F (36.7 C)-99.1 F (37.3 C)] 98.5 F (36.9 C) (12/02 0728) Pulse Rate:  [85-103] 85 (12/02 0728) Resp:  [18-32] 24 (12/02 0728) BP: (103-141)/(68-85) 112/85 (12/02 0728) SpO2:  [91 %-97 %] 97 % (12/02 0813) FiO2 (%):  [40 %] 40 % (12/01 2100) Physical Exam: General: Awake, alert, oriented, no acute distress Respiratory: No respiratory distress, normal work of breathing Extremities: No BLE edema  Laboratory: Recent Labs  Lab 05/25/20 0039 05/26/20 0550 05/27/20 0203  WBC 10.8* 11.7* 11.7*  HGB 10.3* 11.4* 10.8*  HCT 37.7 39.3 35.7*  PLT 452* 478* 435*   Recent Labs  Lab 05/26/20 0550 05/27/20 0203 05/28/20 0329  NA 141 137 138  K 2.9* 3.3* 3.9  CL 88* 91* 94*  CO2 40* 35* 35*  BUN 19 16 12   CREATININE 0.68 0.62 0.60  CALCIUM 8.8* 8.5* 8.5*  PROT 7.1  --   --   BILITOT 0.9  --   --   ALKPHOS 85  --   --   ALT 15  --   --   AST 16  --   --   GLUCOSE 91 121* 108*    Imaging/Diagnostic Tests: No results found.   Ezequiel Essex, MD 05/28/2020, 10:45 AM PGY-1, [place] Intern pager: [phone], text pages welcome

## 2020-05-28 NOTE — Progress Notes (Signed)
Occupational Therapy Treatment Patient Details Name: [name] White MRN: [id number] DOB: [birth year] Today's Date: 05/28/2020    History of present illness 64 y.o. female presenting with 2-week progressive history of dyspnea, chest tightness, and increased oxygen requirement. Pt found to have decreased responsiveness on 11/28 and waiting for MRI. PMH is significant for OHS OSA, HFpEF, HTN, paroxysmal A. fib on Eliquis, T2DM, MDD, GERD, osteoarthritis, morbid obesity, remote tobacco use with 20-pack-year cigarette history.   OT comments  Pt progressing towards OT goals this session, able to perform SPT from bed>BSC>recliner. Mod A for rear peri care post BM with patient able to maintain standing but support leaning on bed. Then Pt was able to self-bath sitting in recliner with assist for back, ankles down, and rear peri area. RN notified of bleeding hemorrhoids. Pt remains motivated and current POC remains appropriate. OT will continue to follow acutely.   Follow Up Recommendations  SNF    Equipment Recommendations  None recommended by OT    Recommendations for Other Services      Precautions / Restrictions Precautions Precautions: Fall Precaution Comments: monitor SpO2 Restrictions Weight Bearing Restrictions: No       Mobility Bed Mobility Overal bed mobility: Needs Assistance Bed Mobility: Supine to Sit     Supine to sit: Min guard;HOB elevated     General bed mobility comments: use of bed rail by Pt  Transfers Overall transfer level: Needs assistance Equipment used: None;Rolling walker (2 wheeled)   Sit to Stand: Min assist (with RW for peri care) Stand pivot transfers: Min assist (no DME, used furniture for support)       General transfer comment: VCs hand placement, SaO2 89% on 5L with activity, HR 120 max    Balance Overall balance assessment: Needs assistance Sitting-balance support: No upper extremity supported;Feet supported Sitting balance-Leahy Scale:  Good Sitting balance - Comments: able to demonstrate dynamic seated balance during bathing with no LOB   Standing balance support: Single extremity supported;During functional activity Standing balance-Leahy Scale: Poor Standing balance comment: reliant on UE support for stability                           ADL either performed or assessed with clinical judgement   ADL Overall ADL's : Needs assistance/impaired     Grooming: Wash/dry face;Oral care;Set up;Sitting Grooming Details (indicate cue type and reason): in recliner Upper Body Bathing: Minimal assistance;Sitting Upper Body Bathing Details (indicate cue type and reason): able to get entire front of body, just needed min A for back Lower Body Bathing: Moderate assistance;Sitting/lateral leans Lower Body Bathing Details (indicate cue type and reason): assist for ankles down Upper Body Dressing : Set up;Sitting Upper Body Dressing Details (indicate cue type and reason): new gown Lower Body Dressing: Maximal assistance;Sitting/lateral leans Lower Body Dressing Details (indicate cue type and reason): to don new socks Toilet Transfer: Minimal assistance;Stand-pivot Toilet Transfer Details (indicate cue type and reason): x2, once from bed>BSC>recliner Toileting- Clothing Manipulation and Hygiene: Moderate assistance;Sit to/from stand;Sitting/lateral lean Toileting - Clothing Manipulation Details (indicate cue type and reason): Pt able to stand and lean forward, OT providing rear peri care     Functional mobility during ADLs: Minimal assistance;Rolling walker (SPT only) General ADL Comments: Pt limited by decreased activity tolerance, desats during activity      Vision       Perception     Praxis      Cognition Arousal/Alertness: Awake/alert Behavior During Therapy: [facility]  for tasks assessed/performed Overall Cognitive Status: Within Functional Limits for tasks assessed                                           Exercises     Shoulder Instructions       General Comments      Pertinent Vitals/ Pain       Pain Assessment: No/denies pain  Home Living                                          Prior Functioning/Environment              Frequency  Min 2X/week        Progress Toward Goals  OT Goals(current goals can now be found in the care plan section)  Progress towards OT goals: Progressing toward goals  Acute Rehab OT Goals Patient Stated Goal: To go to rehab and be able to walk around her house and regain independence OT Goal Formulation: With patient Time For Goal Achievement: 06/04/20 Potential to Achieve Goals: Good  Plan Discharge plan remains appropriate;Frequency remains appropriate    Co-evaluation                 AM-PAC OT "6 Clicks" Daily Activity     Outcome Measure   Help from another person eating meals?: None Help from another person taking care of personal grooming?: A Little Help from another person toileting, which includes using toliet, bedpan, or urinal?: A Lot Help from another person bathing (including washing, rinsing, drying)?: A Lot Help from another person to put on and taking off regular upper body clothing?: A Little Help from another person to put on and taking off regular lower body clothing?: A Lot 6 Click Score: 16    End of Session Equipment Utilized During Treatment: Oxygen (5L)  OT Visit Diagnosis: Unsteadiness on feet (R26.81);Other abnormalities of gait and mobility (R26.89);Muscle weakness (generalized) (M62.81);Other (comment) (decreased pulmonary tolerance)   Activity Tolerance Patient tolerated treatment well   Patient Left in chair;with call bell/phone within reach;with nursing/sitter in room (NT finishing up bath with patient)   Nurse Communication Mobility status;Other (comment)        Time: 3710-6269 OT Time Calculation (min): 45 min  Charges: OT General Charges $OT Visit: 1  Visit OT Treatments $Self Care/Home Management : 23-37 mins $Therapeutic Activity: 8-22 mins  Jesse Sans OTR/L Acute Rehabilitation Services Pager: [phone] Office: [place] 05/28/2020, 10:43 AM

## 2020-05-29 ENCOUNTER — Other Ambulatory Visit: Payer: Self-pay

## 2020-05-29 DIAGNOSIS — I4892 Unspecified atrial flutter: Secondary | ICD-10-CM

## 2020-05-29 DIAGNOSIS — I10 Essential (primary) hypertension: Secondary | ICD-10-CM | POA: Diagnosis not present

## 2020-05-29 DIAGNOSIS — J9621 Acute and chronic respiratory failure with hypoxia: Secondary | ICD-10-CM | POA: Diagnosis not present

## 2020-05-29 LAB — BASIC METABOLIC PANEL
Anion gap: 11 (ref 5–15)
BUN: 12 mg/dL (ref 8–23)
CO2: 33 mmol/L — ABNORMAL HIGH (ref 22–32)
Calcium: 8.7 mg/dL — ABNORMAL LOW (ref 8.9–10.3)
Chloride: 93 mmol/L — ABNORMAL LOW (ref 98–111)
Creatinine, Ser: 0.62 mg/dL (ref 0.44–1.00)
GFR, Estimated: >60 mL/min (ref >60)
Glucose, Bld: 111 mg/dL — ABNORMAL HIGH (ref 70–99)
Potassium: 3.9 mmol/L (ref 3.5–5.1)
Sodium: 137 mmol/L (ref 135–145)

## 2020-05-29 LAB — RESP PANEL BY RT-PCR (FLU A&B, COVID) ARPGX2
Influenza A by PCR: NEGATIVE
Influenza B by PCR: NEGATIVE
SARS Coronavirus 2 by RT PCR: NEGATIVE

## 2020-05-29 MED ORDER — POLYETHYLENE GLYCOL 3350 17 G PO PACK: 17.0000 g | PACK | Freq: Every day | ORAL | 0 refills | Status: AC | PRN

## 2020-05-29 MED ORDER — FLUTICASONE PROPIONATE 50 MCG/ACT NA SUSP: 1.0000 | Freq: Every day | NASAL | 0 refills | Status: AC

## 2020-05-29 MED ORDER — DIPHENHYDRAMINE-ZINC ACETATE 2-0.1 % EX CREA
TOPICAL_CREAM | Freq: Two times a day (BID) | CUTANEOUS | Status: DC | PRN
  Filled 2020-05-29: qty 28

## 2020-05-29 NOTE — Progress Notes (Signed)
[employer] and gave report to nurse via phone.

## 2020-05-29 NOTE — Progress Notes (Signed)
Occupational Therapy Treatment Patient Details Name: [name] White MRN: [id number] DOB: 11/01/1955 Today's Date: 05/29/2020    History of present illness 64 y.o. female presenting with 2-week progressive history of dyspnea, chest tightness, and increased oxygen requirement. Pt found to have decreased responsiveness on 11/28 and waiting for MRI. PMH is significant for OHS OSA, HFpEF, HTN, paroxysmal A. fib on Eliquis, T2DM, MDD, GERD, osteoarthritis, morbid obesity, remote tobacco use with 20-pack-year cigarette history.   OT comments  Pt. Seen for skilled OT treatment.  Focused on energy conservation and mainting o2 during LB adl completion. Pt. Able to complete LB dressing but would benefit from use of sock aide and reacher.  Plan to bring and introduce next session.    5 liters o2 [place] Mid 90s throughout, one drop to 87% while bending forward to adjust sock, able to rebound 90-91% with PLB   Follow Up Recommendations  SNF    Equipment Recommendations  None recommended by OT    Recommendations for Other Services      Precautions / Restrictions Precautions Precautions: Fall Precaution Comments: monitor SpO2       Mobility Bed Mobility                  Transfers                      Balance                                           ADL either performed or assessed with clinical judgement   ADL Overall ADL's : Needs assistance/impaired                     Lower Body Dressing: Minimal assistance;Sitting/lateral leans Lower Body Dressing Details (indicate cue type and reason): to don socks-pt. reports she usually takes socks off in bed and sits eob or eoc to don. able to bend and maintain o2 levels for L foot. short rest break then when attempting R foot o2 brief drop to 87 then rebound to 90-91% with PLB. rec. a/e pt. states it was not necessary               General ADL Comments: pt. able to complete lb dressing without a/e  but would benefit from use of for safety and decreasing burden on o2 with bending forward.  provided energy conservation handouts and reviewed with pt. pt. able to verbalize examples of how she integrates currently in home set up     [name]: Awake/alert Behavior During Therapy: WFL for tasks assessed/performed Overall Cognitive Status: Within Functional Limits for tasks assessed                                          Exercises     Shoulder Instructions       General Comments      Pertinent Vitals/ Pain       Pain Assessment: Faces Pain Location: c/o pain in nose from o2 [place] Pain Descriptors / Indicators: Burning Pain Intervention(s): Other (comment) (rn notified, pt. request water for her o2)  Home Living  Prior Functioning/Environment              Frequency  Min 2X/week        Progress Toward Goals  OT Goals(current goals can now be found in the care plan section)  Progress towards OT goals: Progressing toward goals     Plan Other (comment) (alerted rn of pt. c/o pain in nose and wanting water for o2)    Co-evaluation                 AM-PAC OT "6 Clicks" Daily Activity     Outcome Measure   Help from another person eating meals?: None Help from another person taking care of personal grooming?: A Little Help from another person toileting, which includes using toliet, bedpan, or urinal?: A Lot Help from another person bathing (including washing, rinsing, drying)?: A Lot Help from another person to put on and taking off regular upper body clothing?: A Little Help from another person to put on and taking off regular lower body clothing?: A Lot 6 Click Score: 16    End of Session Equipment Utilized During Treatment: Oxygen  OT Visit Diagnosis: Unsteadiness on feet (R26.81);Other abnormalities of gait  and mobility (R26.89);Muscle weakness (generalized) (M62.81);Other (comment)   Activity Tolerance Patient tolerated treatment well   Patient Left in chair;with call bell/phone within reach;with nursing/sitter in room   Nurse Communication          Time: 9480-1655 OT Time Calculation (min): 23 min  Charges: OT General Charges $OT Visit: 1 Visit OT Treatments $Self Care/Home Management : 23-37 mins  [name] White, COTA/L Acute Rehabilitation [phone]   [name] White 05/29/2020, 1:23 PM

## 2020-05-29 NOTE — Consult Note (Signed)
   [facility] Inpatient Consult   05/29/2020  BRITTON PERKINSON 03/31/1956 [id number]   [place] Organization [ACO] Patient:  [employer]   Patient is active with [facility] Chronic Care Management team. Patient will have the transition of care call conducted by the [place] primary care provider. This patient is also in an Embedded practice which has a chronic disease management Embedded Care Management team. Met with the patient at the bedside.  Patient states she is awaiting a new Trilogy machine because the old one needed replacement. Patient endorses [facility] [Embedded] as her PCP.  Patient is being recommended for SNF.  She states she will have to have the new Triology sent to the facility and have the setting done there.  Plan: Notification sent to the [place] Management team and made aware of above needs. Will follow for disposition.  Update patient status from pending with [facility] Management to [place] [facility] for care management.  Please contact for further questions,  Natividad Brood, RN BSN [facility] Liaison  [phone] business mobile phone Toll free office [phone]  Fax number: [phone] [email or web address]

## 2020-05-29 NOTE — TOC Progression Note (Signed)
Transition of Care [facility]) - Progression Note    Patient Details  Name: [name] White MRN: [id number] Date of Birth: 04/22/1956  Transition of [facility] CM/SW Contact  Pollie Friar, RN Phone Number: 05/29/2020, 1:58 PM  Clinical Narrative:    CM provided the patient with SNF choices and she selected [facility]. CM updated [name] at [facility] and they will begin insurance authorization. [name] is aware of bipap and oxygen needs.  TOC following for SNF approval through [facility].   Expected Discharge Plan: [place] Barriers to Discharge: Continued Medical Work up, SNF Pending bed offer  Expected Discharge Plan and Services Expected Discharge Plan: [place] Choice: [place] Living arrangements for the past 2 months: Apartment                                       Social Determinants of Health (SDOH) Interventions    Readmission Risk Interventions Readmission Risk Prevention Plan 02/07/2020 07/22/2019 03/08/2019  Transportation Screening Complete Complete Complete  Medication Review Press photographer) Complete Complete Complete  PCP or Specialist appointment within 3-5 days of discharge Complete Complete Complete  HRI or Home Care Consult Complete Complete Complete  SW Recovery Care/Counseling Consult Complete Complete Complete  Palliative Care Screening Complete Not Applicable Not [place] Complete Not Applicable Not Applicable  Some recent data might be hidden

## 2020-05-29 NOTE — Plan of Care (Signed)
Problem: Education: Goal: Knowledge of General Education information will improve Description: Including pain rating scale, medication(s)/side effects and non-pharmacologic comfort measures 05/29/2020 1806 by Morene Rankins, LPN Outcome: Adequate for Discharge 05/29/2020 1806 by Morene Rankins, LPN Outcome: Adequate for Discharge   Problem: Health Behavior/Discharge Planning: Goal: Ability to manage health-related needs will improve 05/29/2020 1806 by Morene Rankins, LPN Outcome: Adequate for Discharge 05/29/2020 1806 by Morene Rankins, LPN Outcome: Adequate for Discharge   Problem: Clinical Measurements: Goal: Ability to maintain clinical measurements within normal limits will improve 05/29/2020 1806 by Morene Rankins, LPN Outcome: Adequate for Discharge 05/29/2020 1806 by Morene Rankins, LPN Outcome: Adequate for Discharge Goal: Will remain free from infection 05/29/2020 1806 by Morene Rankins, LPN Outcome: Adequate for Discharge 05/29/2020 1806 by Morene Rankins, LPN Outcome: Adequate for Discharge Goal: Diagnostic test results will improve 05/29/2020 1806 by Morene Rankins, LPN Outcome: Adequate for Discharge 05/29/2020 1806 by Morene Rankins, LPN Outcome: Adequate for Discharge Goal: Respiratory complications will improve 05/29/2020 1806 by Morene Rankins, LPN Outcome: Adequate for Discharge 05/29/2020 1806 by Morene Rankins, LPN Outcome: Adequate for Discharge Goal: Cardiovascular complication will be avoided 05/29/2020 1806 by Morene Rankins, LPN Outcome: Adequate for Discharge 05/29/2020 1806 by Morene Rankins, LPN Outcome: Adequate for Discharge   Problem: Activity: Goal: Risk for activity intolerance will decrease 05/29/2020 1806 by Morene Rankins, LPN Outcome: Adequate for Discharge 05/29/2020 1806 by Morene Rankins, LPN Outcome: Adequate for Discharge   Problem: Nutrition: Goal: Adequate nutrition will be maintained 05/29/2020 1806 by  Morene Rankins, LPN Outcome: Adequate for Discharge 05/29/2020 1806 by Morene Rankins, LPN Outcome: Adequate for Discharge   Problem: Coping: Goal: Level of anxiety will decrease 05/29/2020 1806 by Morene Rankins, LPN Outcome: Adequate for Discharge 05/29/2020 1806 by Morene Rankins, LPN Outcome: Adequate for Discharge   Problem: Elimination: Goal: Will not experience complications related to bowel motility 05/29/2020 1806 by Morene Rankins, LPN Outcome: Adequate for Discharge 05/29/2020 1806 by Morene Rankins, LPN Outcome: Adequate for Discharge Goal: Will not experience complications related to urinary retention 05/29/2020 1806 by Morene Rankins, LPN Outcome: Adequate for Discharge 05/29/2020 1806 by Morene Rankins, LPN Outcome: Adequate for Discharge   Problem: Pain Managment: Goal: General experience of comfort will improve 05/29/2020 1806 by Morene Rankins, LPN Outcome: Adequate for Discharge 05/29/2020 1806 by Morene Rankins, LPN Outcome: Adequate for Discharge   Problem: Safety: Goal: Ability to remain free from injury will improve 05/29/2020 1806 by Morene Rankins, LPN Outcome: Adequate for Discharge 05/29/2020 1806 by Morene Rankins, LPN Outcome: Adequate for Discharge   Problem: Skin Integrity: Goal: Risk for impaired skin integrity will decrease 05/29/2020 1806 by Morene Rankins, LPN Outcome: Adequate for Discharge 05/29/2020 1806 by Morene Rankins, LPN Outcome: Adequate for Discharge   Problem: Acute Rehab PT Goals(only PT should resolve) Goal: Pt Will Go Supine/Side To Sit Outcome: Adequate for Discharge Goal: Patient Will Transfer Sit To/From Stand Outcome: Adequate for Discharge Goal: Pt Will Transfer Bed To Chair/Chair To Bed Outcome: Adequate for Discharge Goal: Pt Will Ambulate Outcome: Adequate for Discharge   Problem: Acute Rehab OT Goals (only OT should resolve) Goal: Pt. Will Perform Grooming Outcome: Adequate for  Discharge Goal: Pt. Will Perform Lower Body Bathing Outcome: Adequate for Discharge Goal: Pt. Will Perform Lower Body Dressing Outcome: Adequate for Discharge Goal: Pt. Will Transfer To Toilet Outcome: Adequate  for Discharge Goal: Pt. Will Perform Toileting-Clothing Manipulation Outcome: Adequate for Discharge Goal: OT Additional ADL Goal #1 Outcome: Adequate for Discharge

## 2020-05-29 NOTE — Progress Notes (Addendum)
Family Medicine Teaching Service Daily Progress Note Intern Pager: [phone]  Patient name: [name] White Medical record number: [id number] Date of birth: 1955-07-10 Age: 64 y.o. Gender: female  Primary Care Provider: Richarda Osmond, DO Consultants: None Code Status: DNR  Pt Overview and Major Events to Date: Admitted 11/24 Acute episode altered mental status 11/28: resolved  Assessment and Plan: [name] White a 64 y.o.femalepresenting with2-week progressive history of dyspnea, chest tightness, and increased oxygen requirement. PMH is significant forOHS,pulmonary hypertension,OSA, HFpEF, HTN, paroxysmal A. fib on Eliquis, T2DM,MDD, GERD, osteoarthritis, morbid obesity,remote tobacco use with 20-pack-year cigarette history.  Acute on chronic hypoxic/hypercapnic respiratory failure 2/2 OHS in setting of diastolic HF Doing well this morning, says that her breathing"is better than ever".  She feels "wonderful".  Only complaint is some right jaw pain and knee pain for which she requests Tylenol.  S/p acute episode of altered mental status 11/28, believed to be due to CO2 retention 2/2 nonadherence with BiPAP overnight.Head CT and MRI negative.Has been improving with continuous BiPAP in place.Weaned to home level of oxygen at 6 L via nasal cannula 11/30, has been stable since.  -BiPAP nightly going forward -Continue PT/OT -Working towards SNF placement, medically stable for discharge  Headache:Improving No headache this morning.S/p Toradol x111/28. Due to possible sinus infection, s/p five day course ofamoxicillin 500 TID x 5d (11/28 - 12/2).   OHSOSA Altered mental status as above.Home O2 level of 5 to 6 L.Home meds include albuterol inhaler and DuoNebs as needed as well as Trelegy and home O2. -ContinueBiPAP overnight -Continue home medications -Monitor respiratory status -Incentive spirometry every 4 hours  HFpEFHypertension:Chronic,  stable Home medications includeatorvastatin, Cardizem, metoprolol.Initially hypertensive in the ED, but once admitted normotensive.Normotensive last 24 hours.[id number], last 103/74 with pulse 108. -Continue home meds -Declineto consult cardiology at this time,may in future if clinically indicated  Asymmetric BLE edema:Chronic, stable Noted on admission physical exam,L>R.Given tachypnea and tachycardia, obtained CTA PE chest; negative for PE. -Continue to monitor -Ambulate patient -Activity as tolerated -Elevate legs  Paroxysmal A. fib on Eliquis:Chronic, stable At home takesEliquis 5 mg twice daily, Cardizem, flecainide. -Continue home medications  Type 2 diabetes mellitus BG today is108.Glucose at admission 99.No current home meds, appears to be controlled by diet.Hemoglobin A1c 11/24 was 6.4 -Monitor BMPs daily.  Thrombocytosis, chronic Platelets 504 on admission.PLT 478 yesterday.  -Continue to Monitor   Normocytic anemia: Chronic Last Hgb yesterday 11.7 with MCV 90.8.   MDD: Chronic, stable Home meds includevenlafaxine total dose 187.5 mg daily. -Continue home med.  Anxiety -Continue home hydroxyzine  GERD: Chronic, stable Home meds includeomeprazole 20 mg daily. -Protonix while inpatient  Osteoarthritis: Chronic, stable Home meds includegabapentin 200 mg 3 times daily. -Continue home med  FEN/GI:Regular diet PPx:On Eliquis   Status is: Inpatient  Remains inpatient appropriate because:awaiting SNF placement   Dispo:  Patient From: Home  Planned Disposition: [place]  Expected discharge date: 05/29/20  Medically stable for discharge: Yes   Subjective:  Patient found sitting up in recliner with breakfast tray in front of her.  She reports feeling quite well this morning and that her breathing is "better than ever".  Says that she feels wonderful and is back to her baseline.  Only complaint  is some right-sided jaw and cheek pain along with knee pain, for which she requested Tylenol.  She is looking forward to rehab.  Objective: Temp:  [98.3 F (36.8 C)] 98.3 F (36.8 C) (12/02 2200) Pulse Rate:  [88-94] 94 (  12/03 0558) Resp:  [17-31] 17 (12/03 0558) BP: (111)/(66) 111/66 (12/02 2200) SpO2:  [96 %-100 %] 100 % (12/03 0558) FiO2 (%):  [40 %] 40 % (12/02 2200)   Physical Exam: General: Awake, alert, oriented, no acute distress Respiratory: Normal work of breathing, no respiratory distress Extremities: Able to move all spontaneously Neuro: Cranial nerves II through X grossly intact  Laboratory: Recent Labs  Lab 05/25/20 0039 05/26/20 0550 05/27/20 0203  WBC 10.8* 11.7* 11.7*  HGB 10.3* 11.4* 10.8*  HCT 37.7 39.3 35.7*  PLT 452* 478* 435*   Recent Labs  Lab 05/26/20 0550 05/26/20 0550 05/27/20 0203 05/28/20 0329 05/29/20 0455  NA 141   < > 137 138 137  K 2.9*   < > 3.3* 3.9 3.9  CL 88*   < > 91* 94* 93*  CO2 40*   < > 35* 35* 33*  BUN 19   < > 16 12 12   CREATININE 0.68   < > 0.62 0.60 0.62  CALCIUM 8.8*   < > 8.5* 8.5* 8.7*  PROT 7.1  --   --   --   --   BILITOT 0.9  --   --   --   --   ALKPHOS 85  --   --   --   --   ALT 15  --   --   --   --   AST 16  --   --   --   --   GLUCOSE 91   < > 121* 108* 111*   < > = values in this interval not displayed.    Imaging/Diagnostic Tests: No results found.   Ezequiel Essex, MD 05/29/2020, 7:40 AM PGY-1, [place] Intern pager: [phone], text pages welcome

## 2020-05-29 NOTE — TOC Transition Note (Signed)
Transition of Care [facility]) - CM/SW Discharge Note   Patient Details  Name: [name] White MRN: [id number] Date of Birth: [birth year]  Transition of Care [facility]) CM/SW Contact:  Pollie Friar, RN Phone Number: 05/29/2020, 3:40 PM   Clinical Narrative:    [facility] care had received insurance authorization for rehab. CM updated the MD and patient and she will d/c to [facility] today. Pt will transport via [facility]. D/c packet at the desk and bedside RN updated.  Room: 130 Number for report: [phone]   Final next level of care: Skilled Nursing Facility Barriers to Discharge: No Barriers Identified   Patient Goals and CMS Choice Patient states their goals for this hospitalization and ongoing recovery are:: "get back to walking, taking care of myself' CMS Medicare.gov Compare Post Acute Care list provided to:: Patient Choice offered to / list presented to : Patient  Discharge Placement              Patient chooses bed at: [facility] Patient to be transferred to facility by: [place] Name of family member notified: Messages left for her daugher/ niece and sister Patient and family notified of of transfer: 05/29/20  Discharge Plan and Services     Post Acute Care Choice: [place]                               Social Determinants of Health (SDOH) Interventions     Readmission Risk Interventions Readmission Risk Prevention Plan 02/07/2020 07/22/2019 03/08/2019  Transportation Screening Complete Complete Complete  Medication Review Press photographer) Complete Complete Complete  PCP or Specialist appointment within 3-5 days of discharge Complete Complete Complete  HRI or Home Care Consult Complete Complete Complete  SW Recovery Care/Counseling Consult Complete Complete Complete  Palliative Care Screening Complete Not Applicable Not [place] Complete Not Applicable Not Applicable  Some recent data might be hidden

## 2020-05-30 DIAGNOSIS — Z86711 Personal history of pulmonary embolism: Secondary | ICD-10-CM | POA: Diagnosis not present

## 2020-05-30 DIAGNOSIS — I5032 Chronic diastolic (congestive) heart failure: Secondary | ICD-10-CM | POA: Diagnosis not present

## 2020-05-30 DIAGNOSIS — I272 Pulmonary hypertension, unspecified: Secondary | ICD-10-CM | POA: Diagnosis not present

## 2020-05-30 DIAGNOSIS — F329 Major depressive disorder, single episode, unspecified: Secondary | ICD-10-CM | POA: Diagnosis present

## 2020-05-30 DIAGNOSIS — Z751 Person awaiting admission to adequate facility elsewhere: Secondary | ICD-10-CM | POA: Diagnosis not present

## 2020-05-30 DIAGNOSIS — I110 Hypertensive heart disease with heart failure: Secondary | ICD-10-CM | POA: Diagnosis present

## 2020-05-30 DIAGNOSIS — I480 Paroxysmal atrial fibrillation: Secondary | ICD-10-CM | POA: Diagnosis not present

## 2020-05-30 DIAGNOSIS — M6281 Muscle weakness (generalized): Secondary | ICD-10-CM | POA: Diagnosis not present

## 2020-05-30 DIAGNOSIS — E872 Acidosis: Secondary | ICD-10-CM | POA: Diagnosis not present

## 2020-05-30 DIAGNOSIS — Z6841 Body Mass Index (BMI) 40.0 and over, adult: Secondary | ICD-10-CM | POA: Diagnosis not present

## 2020-05-30 DIAGNOSIS — D500 Iron deficiency anemia secondary to blood loss (chronic): Secondary | ICD-10-CM | POA: Diagnosis present

## 2020-05-30 DIAGNOSIS — L0591 Pilonidal cyst without abscess: Secondary | ICD-10-CM | POA: Diagnosis present

## 2020-05-30 DIAGNOSIS — R0902 Hypoxemia: Secondary | ICD-10-CM | POA: Diagnosis not present

## 2020-05-30 DIAGNOSIS — E662 Morbid (severe) obesity with alveolar hypoventilation: Secondary | ICD-10-CM | POA: Diagnosis not present

## 2020-05-30 DIAGNOSIS — Z9981 Dependence on supplemental oxygen: Secondary | ICD-10-CM | POA: Diagnosis not present

## 2020-05-30 DIAGNOSIS — R0689 Other abnormalities of breathing: Secondary | ICD-10-CM | POA: Diagnosis not present

## 2020-05-30 DIAGNOSIS — I10 Essential (primary) hypertension: Secondary | ICD-10-CM | POA: Diagnosis not present

## 2020-05-30 DIAGNOSIS — D649 Anemia, unspecified: Secondary | ICD-10-CM | POA: Diagnosis not present

## 2020-05-30 DIAGNOSIS — J9611 Chronic respiratory failure with hypoxia: Secondary | ICD-10-CM | POA: Diagnosis not present

## 2020-05-30 DIAGNOSIS — J9622 Acute and chronic respiratory failure with hypercapnia: Secondary | ICD-10-CM | POA: Diagnosis not present

## 2020-05-30 DIAGNOSIS — J449 Chronic obstructive pulmonary disease, unspecified: Secondary | ICD-10-CM | POA: Diagnosis present

## 2020-05-30 DIAGNOSIS — J9621 Acute and chronic respiratory failure with hypoxia: Secondary | ICD-10-CM | POA: Diagnosis not present

## 2020-05-30 DIAGNOSIS — Z20822 Contact with and (suspected) exposure to covid-19: Secondary | ICD-10-CM | POA: Diagnosis not present

## 2020-05-30 DIAGNOSIS — K219 Gastro-esophageal reflux disease without esophagitis: Secondary | ICD-10-CM | POA: Diagnosis present

## 2020-05-30 DIAGNOSIS — Z66 Do not resuscitate: Secondary | ICD-10-CM | POA: Diagnosis not present

## 2020-05-30 DIAGNOSIS — Z789 Other specified health status: Secondary | ICD-10-CM | POA: Diagnosis not present

## 2020-05-30 DIAGNOSIS — M2550 Pain in unspecified joint: Secondary | ICD-10-CM | POA: Diagnosis not present

## 2020-05-30 DIAGNOSIS — E1159 Type 2 diabetes mellitus with other circulatory complications: Secondary | ICD-10-CM | POA: Diagnosis not present

## 2020-05-30 DIAGNOSIS — D75839 Thrombocytosis, unspecified: Secondary | ICD-10-CM | POA: Diagnosis present

## 2020-05-30 DIAGNOSIS — J45909 Unspecified asthma, uncomplicated: Secondary | ICD-10-CM | POA: Diagnosis not present

## 2020-05-30 DIAGNOSIS — Z7401 Bed confinement status: Secondary | ICD-10-CM | POA: Diagnosis not present

## 2020-05-30 DIAGNOSIS — J3489 Other specified disorders of nose and nasal sinuses: Secondary | ICD-10-CM | POA: Diagnosis present

## 2020-05-30 DIAGNOSIS — Z79899 Other long term (current) drug therapy: Secondary | ICD-10-CM | POA: Diagnosis not present

## 2020-05-30 DIAGNOSIS — J90 Pleural effusion, not elsewhere classified: Secondary | ICD-10-CM | POA: Diagnosis not present

## 2020-05-30 DIAGNOSIS — I152 Hypertension secondary to endocrine disorders: Secondary | ICD-10-CM | POA: Diagnosis not present

## 2020-05-30 DIAGNOSIS — G4733 Obstructive sleep apnea (adult) (pediatric): Secondary | ICD-10-CM | POA: Diagnosis not present

## 2020-05-30 DIAGNOSIS — J80 Acute respiratory distress syndrome: Secondary | ICD-10-CM | POA: Diagnosis not present

## 2020-05-30 DIAGNOSIS — R0602 Shortness of breath: Secondary | ICD-10-CM | POA: Diagnosis not present

## 2020-05-30 DIAGNOSIS — Z87891 Personal history of nicotine dependence: Secondary | ICD-10-CM | POA: Diagnosis not present

## 2020-05-30 DIAGNOSIS — D6869 Other thrombophilia: Secondary | ICD-10-CM | POA: Diagnosis not present

## 2020-05-30 DIAGNOSIS — E7800 Pure hypercholesterolemia, unspecified: Secondary | ICD-10-CM | POA: Diagnosis not present

## 2020-05-30 DIAGNOSIS — J9600 Acute respiratory failure, unspecified whether with hypoxia or hypercapnia: Secondary | ICD-10-CM | POA: Diagnosis not present

## 2020-05-30 DIAGNOSIS — D509 Iron deficiency anemia, unspecified: Secondary | ICD-10-CM | POA: Diagnosis not present

## 2020-05-30 DIAGNOSIS — Z7901 Long term (current) use of anticoagulants: Secondary | ICD-10-CM | POA: Diagnosis not present

## 2020-05-30 DIAGNOSIS — R69 Illness, unspecified: Secondary | ICD-10-CM | POA: Diagnosis not present

## 2020-05-30 DIAGNOSIS — R262 Difficulty in walking, not elsewhere classified: Secondary | ICD-10-CM | POA: Diagnosis not present

## 2020-06-02 DIAGNOSIS — I272 Pulmonary hypertension, unspecified: Secondary | ICD-10-CM | POA: Diagnosis not present

## 2020-06-02 DIAGNOSIS — E662 Morbid (severe) obesity with alveolar hypoventilation: Secondary | ICD-10-CM | POA: Diagnosis not present

## 2020-06-02 DIAGNOSIS — I10 Essential (primary) hypertension: Secondary | ICD-10-CM | POA: Diagnosis not present

## 2020-06-02 DIAGNOSIS — J9621 Acute and chronic respiratory failure with hypoxia: Secondary | ICD-10-CM | POA: Diagnosis not present

## 2020-06-02 DIAGNOSIS — I480 Paroxysmal atrial fibrillation: Secondary | ICD-10-CM | POA: Diagnosis not present

## 2020-06-04 ENCOUNTER — Telehealth: Payer: Self-pay | Admitting: Student in an Organized Health Care Education/Training Program

## 2020-06-04 NOTE — Telephone Encounter (Signed)
Patient called and states that she is not going to be able to come into the office due to being in a rehab facility and having no transportation she is asking if Dr. Ouida Sills can do a virtual visit. I informed the patient that I would ask and that the doctor or nurse will call her to let her know if the visit can be done virtual. Please advise. Thanks

## 2020-06-05 ENCOUNTER — Ambulatory Visit (INDEPENDENT_AMBULATORY_CARE_PROVIDER_SITE_OTHER): Payer: Medicare HMO | Admitting: Student in an Organized Health Care Education/Training Program

## 2020-06-05 ENCOUNTER — Other Ambulatory Visit: Payer: Self-pay

## 2020-06-05 DIAGNOSIS — Z09 Encounter for follow-up examination after completed treatment for conditions other than malignant neoplasm: Secondary | ICD-10-CM

## 2020-06-09 ENCOUNTER — Telehealth: Payer: Self-pay | Admitting: Licensed Clinical Social Worker

## 2020-06-09 ENCOUNTER — Telehealth: Payer: Self-pay

## 2020-06-09 NOTE — Progress Notes (Signed)
This encounter was created in error - please disregard. This encounter was created in error - please disregard. This encounter was created in error - please disregard. 

## 2020-06-09 NOTE — Chronic Care Management (AMB) (Signed)
   Social Work  Care Management Collaboration 06/09/2020 Name: ELLIOTT QUADE MRN: [id number] DOB: [birth year] MAKELLE MARRONE is a 64 y.o. year old female who sees Richarda Osmond, DO for primary care. LCSW was consulted by CCM RN to assistance patient with concerns while in the nursing home  Recommendation: After collaboration with CCM RN care manager it is determined that patient may benefit from speaking with the facility social worker and the local [place] to express her concerns Intervention: Attempted to call patient.  Left voice message.  LCSW collaborated with CCM RN.  [place] council to obtain [place] and share patient's concerns  Provided patient with phone number and contact name  Will f/u with Claudia Desanctis [phone]. [place] if patient gives permission to provide her phone number  Review of patient status, including review of consultants reports, relevant laboratory and other test results, and collaboration with appropriate care team members and the patient's provider was performed as part of comprehensive patient evaluation and provision of chronic care management services.    Plan: LCSW will continue to collaborated with CCM RN for patient's ongoing needs  [name], [place] / [place]   [phone] 12:31 PM

## 2020-06-09 NOTE — Telephone Encounter (Signed)
  Care Management     RN Outreach Note  06/09/2020 Name: PATSIE MCCARDLE MRN: [id number] DOB: 06/27/1956  SAI MOURA is a 64 y.o. year old female who is a primary care patient of Richarda Osmond, [place] received a call from [place] Desanctis today by phone to discuss that she was unhappy with some of the services at the SNF she is at [facility].  Mrs. Udovich stated that she had tried to call and speak with someone and was unable and asked if I would help her.  I advised her that I would collaborate with my coworker LCSW Laurance Flatten to see what our options would be. After discussion, I called and left a message with the Social Worker Orson Slick and asked her if she would talk with Mrs. Mcnay.  I called Mrs. Dockery back informing her of the information and the [place] would be reaching out to her with the number for the [employer]. Patient was fine with the information.    Follow Up Plan:RNCM will follow up with the patient next week.   Lazaro Arms RN, BSN, [facility] Care Management Coordinator [place] Phone: [phone] I Fax: [phone]

## 2020-06-11 DIAGNOSIS — R262 Difficulty in walking, not elsewhere classified: Secondary | ICD-10-CM | POA: Diagnosis not present

## 2020-06-11 DIAGNOSIS — I5032 Chronic diastolic (congestive) heart failure: Secondary | ICD-10-CM | POA: Diagnosis not present

## 2020-06-11 DIAGNOSIS — G4733 Obstructive sleep apnea (adult) (pediatric): Secondary | ICD-10-CM | POA: Diagnosis not present

## 2020-06-11 DIAGNOSIS — E662 Morbid (severe) obesity with alveolar hypoventilation: Secondary | ICD-10-CM | POA: Diagnosis not present

## 2020-06-11 DIAGNOSIS — J9611 Chronic respiratory failure with hypoxia: Secondary | ICD-10-CM | POA: Diagnosis not present

## 2020-06-11 DIAGNOSIS — M6281 Muscle weakness (generalized): Secondary | ICD-10-CM | POA: Diagnosis not present

## 2020-06-13 ENCOUNTER — Other Ambulatory Visit: Payer: Self-pay

## 2020-06-13 ENCOUNTER — Inpatient Hospital Stay (HOSPITAL_COMMUNITY)
Admission: EM | Admit: 2020-06-13 | Discharge: 2020-06-26 | DRG: 189 | Disposition: A | Discharge status: Skilled Nursing Facility | Payer: Medicare HMO | Source: Skilled Nursing Facility | Attending: Family Medicine | Admitting: Family Medicine

## 2020-06-13 ENCOUNTER — Encounter (HOSPITAL_COMMUNITY): Payer: Self-pay | Admitting: Emergency Medicine

## 2020-06-13 ENCOUNTER — Emergency Department (HOSPITAL_COMMUNITY): Payer: Medicare HMO

## 2020-06-13 DIAGNOSIS — Z6841 Body Mass Index (BMI) 40.0 and over, adult: Secondary | ICD-10-CM

## 2020-06-13 DIAGNOSIS — D649 Anemia, unspecified: Secondary | ICD-10-CM | POA: Diagnosis not present

## 2020-06-13 DIAGNOSIS — I480 Paroxysmal atrial fibrillation: Secondary | ICD-10-CM | POA: Diagnosis present

## 2020-06-13 DIAGNOSIS — J80 Acute respiratory distress syndrome: Secondary | ICD-10-CM | POA: Diagnosis not present

## 2020-06-13 DIAGNOSIS — F339 Major depressive disorder, recurrent, unspecified: Secondary | ICD-10-CM | POA: Diagnosis not present

## 2020-06-13 DIAGNOSIS — I10 Essential (primary) hypertension: Secondary | ICD-10-CM | POA: Diagnosis not present

## 2020-06-13 DIAGNOSIS — D500 Iron deficiency anemia secondary to blood loss (chronic): Secondary | ICD-10-CM | POA: Diagnosis present

## 2020-06-13 DIAGNOSIS — R262 Difficulty in walking, not elsewhere classified: Secondary | ICD-10-CM | POA: Diagnosis not present

## 2020-06-13 DIAGNOSIS — I110 Hypertensive heart disease with heart failure: Secondary | ICD-10-CM | POA: Diagnosis present

## 2020-06-13 DIAGNOSIS — R0602 Shortness of breath: Secondary | ICD-10-CM | POA: Diagnosis present

## 2020-06-13 DIAGNOSIS — J449 Chronic obstructive pulmonary disease, unspecified: Secondary | ICD-10-CM | POA: Diagnosis not present

## 2020-06-13 DIAGNOSIS — Z9981 Dependence on supplemental oxygen: Secondary | ICD-10-CM

## 2020-06-13 DIAGNOSIS — K219 Gastro-esophageal reflux disease without esophagitis: Secondary | ICD-10-CM | POA: Diagnosis not present

## 2020-06-13 DIAGNOSIS — Z7901 Long term (current) use of anticoagulants: Secondary | ICD-10-CM | POA: Diagnosis not present

## 2020-06-13 DIAGNOSIS — Z20822 Contact with and (suspected) exposure to covid-19: Secondary | ICD-10-CM | POA: Diagnosis present

## 2020-06-13 DIAGNOSIS — J3489 Other specified disorders of nose and nasal sinuses: Secondary | ICD-10-CM | POA: Diagnosis not present

## 2020-06-13 DIAGNOSIS — D509 Iron deficiency anemia, unspecified: Secondary | ICD-10-CM | POA: Diagnosis not present

## 2020-06-13 DIAGNOSIS — I5030 Unspecified diastolic (congestive) heart failure: Secondary | ICD-10-CM | POA: Diagnosis not present

## 2020-06-13 DIAGNOSIS — E662 Morbid (severe) obesity with alveolar hypoventilation: Secondary | ICD-10-CM | POA: Diagnosis present

## 2020-06-13 DIAGNOSIS — Z825 Family history of asthma and other chronic lower respiratory diseases: Secondary | ICD-10-CM

## 2020-06-13 DIAGNOSIS — J45909 Unspecified asthma, uncomplicated: Secondary | ICD-10-CM | POA: Diagnosis not present

## 2020-06-13 DIAGNOSIS — M150 Primary generalized (osteo)arthritis: Secondary | ICD-10-CM | POA: Diagnosis present

## 2020-06-13 DIAGNOSIS — R0689 Other abnormalities of breathing: Secondary | ICD-10-CM | POA: Diagnosis not present

## 2020-06-13 DIAGNOSIS — Z86711 Personal history of pulmonary embolism: Secondary | ICD-10-CM | POA: Diagnosis not present

## 2020-06-13 DIAGNOSIS — R69 Illness, unspecified: Secondary | ICD-10-CM | POA: Diagnosis not present

## 2020-06-13 DIAGNOSIS — Z7401 Bed confinement status: Secondary | ICD-10-CM | POA: Diagnosis not present

## 2020-06-13 DIAGNOSIS — E872 Acidosis: Secondary | ICD-10-CM | POA: Diagnosis not present

## 2020-06-13 DIAGNOSIS — J9622 Acute and chronic respiratory failure with hypercapnia: Secondary | ICD-10-CM | POA: Diagnosis not present

## 2020-06-13 DIAGNOSIS — E1159 Type 2 diabetes mellitus with other circulatory complications: Secondary | ICD-10-CM | POA: Diagnosis not present

## 2020-06-13 DIAGNOSIS — Z79899 Other long term (current) drug therapy: Secondary | ICD-10-CM

## 2020-06-13 DIAGNOSIS — D75839 Thrombocytosis, unspecified: Secondary | ICD-10-CM | POA: Diagnosis present

## 2020-06-13 DIAGNOSIS — J9621 Acute and chronic respiratory failure with hypoxia: Secondary | ICD-10-CM | POA: Diagnosis not present

## 2020-06-13 DIAGNOSIS — J9601 Acute respiratory failure with hypoxia: Secondary | ICD-10-CM | POA: Diagnosis not present

## 2020-06-13 DIAGNOSIS — J9600 Acute respiratory failure, unspecified whether with hypoxia or hypercapnia: Secondary | ICD-10-CM | POA: Diagnosis not present

## 2020-06-13 DIAGNOSIS — M6281 Muscle weakness (generalized): Secondary | ICD-10-CM | POA: Diagnosis not present

## 2020-06-13 DIAGNOSIS — Z66 Do not resuscitate: Secondary | ICD-10-CM | POA: Diagnosis not present

## 2020-06-13 DIAGNOSIS — J90 Pleural effusion, not elsewhere classified: Secondary | ICD-10-CM | POA: Diagnosis not present

## 2020-06-13 DIAGNOSIS — J9610 Chronic respiratory failure, unspecified whether with hypoxia or hypercapnia: Secondary | ICD-10-CM | POA: Diagnosis not present

## 2020-06-13 DIAGNOSIS — Z8249 Family history of ischemic heart disease and other diseases of the circulatory system: Secondary | ICD-10-CM

## 2020-06-13 DIAGNOSIS — R0902 Hypoxemia: Secondary | ICD-10-CM | POA: Diagnosis not present

## 2020-06-13 DIAGNOSIS — L0591 Pilonidal cyst without abscess: Secondary | ICD-10-CM | POA: Diagnosis present

## 2020-06-13 DIAGNOSIS — Z87891 Personal history of nicotine dependence: Secondary | ICD-10-CM

## 2020-06-13 DIAGNOSIS — I5032 Chronic diastolic (congestive) heart failure: Secondary | ICD-10-CM | POA: Diagnosis present

## 2020-06-13 DIAGNOSIS — Z789 Other specified health status: Secondary | ICD-10-CM | POA: Diagnosis not present

## 2020-06-13 DIAGNOSIS — Z803 Family history of malignant neoplasm of breast: Secondary | ICD-10-CM

## 2020-06-13 DIAGNOSIS — F329 Major depressive disorder, single episode, unspecified: Secondary | ICD-10-CM | POA: Diagnosis present

## 2020-06-13 DIAGNOSIS — M1990 Unspecified osteoarthritis, unspecified site: Secondary | ICD-10-CM | POA: Diagnosis not present

## 2020-06-13 DIAGNOSIS — Z751 Person awaiting admission to adequate facility elsewhere: Secondary | ICD-10-CM | POA: Diagnosis not present

## 2020-06-13 DIAGNOSIS — M2550 Pain in unspecified joint: Secondary | ICD-10-CM | POA: Diagnosis not present

## 2020-06-13 LAB — CBC WITH DIFFERENTIAL/PLATELET
Abs Immature Granulocytes: 0.03 10*3/uL (ref 0.00–0.07)
Basophils Absolute: 0 10*3/uL (ref 0.0–0.1)
Basophils Relative: 0 %
Eosinophils Absolute: 0.4 10*3/uL (ref 0.0–0.5)
Eosinophils Relative: 5 %
HCT: 37.6 % (ref 36.0–46.0)
Hemoglobin: 10.9 g/dL — ABNORMAL LOW (ref 12.0–15.0)
Immature Granulocytes: 0 %
Lymphocytes Relative: 11 %
Lymphs Abs: 1 10*3/uL (ref 0.7–4.0)
MCH: 27.3 pg (ref 26.0–34.0)
MCHC: 29 g/dL — ABNORMAL LOW (ref 30.0–36.0)
MCV: 94.2 fL (ref 80.0–100.0)
Monocytes Absolute: 0.3 10*3/uL (ref 0.1–1.0)
Monocytes Relative: 3 %
Neutro Abs: 7.4 10*3/uL (ref 1.7–7.7)
Neutrophils Relative %: 81 %
Platelets: 563 10*3/uL — ABNORMAL HIGH (ref 150–400)
RBC: 3.99 MIL/uL (ref 3.87–5.11)
RDW: 16.8 % — ABNORMAL HIGH (ref 11.5–15.5)
WBC: 9.1 10*3/uL (ref 4.0–10.5)
nRBC: 0 % (ref 0.0–0.2)

## 2020-06-13 LAB — LACTIC ACID, PLASMA: Lactic Acid, Venous: 0.7 mmol/L (ref 0.5–1.9)

## 2020-06-13 LAB — COMPREHENSIVE METABOLIC PANEL
ALT: 21 U/L (ref 0–44)
AST: 22 U/L (ref 15–41)
Albumin: 3.4 g/dL — ABNORMAL LOW (ref 3.5–5.0)
Alkaline Phosphatase: 99 U/L (ref 38–126)
Anion gap: 11 (ref 5–15)
BUN: 7 mg/dL — ABNORMAL LOW (ref 8–23)
CO2: 37 mmol/L — ABNORMAL HIGH (ref 22–32)
Calcium: 9.4 mg/dL (ref 8.9–10.3)
Chloride: 93 mmol/L — ABNORMAL LOW (ref 98–111)
Creatinine, Ser: 0.53 mg/dL (ref 0.44–1.00)
GFR, Estimated: >60 mL/min (ref >60)
Glucose, Bld: 136 mg/dL — ABNORMAL HIGH (ref 70–99)
Potassium: 4 mmol/L (ref 3.5–5.1)
Sodium: 141 mmol/L (ref 135–145)
Total Bilirubin: 0.5 mg/dL (ref 0.3–1.2)
Total Protein: 7.9 g/dL (ref 6.5–8.1)

## 2020-06-13 LAB — URINALYSIS, ROUTINE W REFLEX MICROSCOPIC
Bacteria, UA: NONE SEEN
Bilirubin Urine: NEGATIVE
Glucose, UA: NEGATIVE mg/dL
Ketones, ur: NEGATIVE mg/dL
Leukocytes,Ua: NEGATIVE
Nitrite: NEGATIVE
Protein, ur: NEGATIVE mg/dL
Specific Gravity, Urine: 1.005 (ref 1.005–1.030)
pH: 6 (ref 5.0–8.0)

## 2020-06-13 LAB — I-STAT VENOUS BLOOD GAS, ED
Acid-Base Excess: 17 mmol/L — ABNORMAL HIGH (ref 0.0–2.0)
Bicarbonate: 46.7 mmol/L — ABNORMAL HIGH (ref 20.0–28.0)
Calcium, Ion: 1.15 mmol/L (ref 1.15–1.40)
HCT: 38 % (ref 36.0–46.0)
Hemoglobin: 12.9 g/dL (ref 12.0–15.0)
O2 Saturation: 83 %
Potassium: 4.2 mmol/L (ref 3.5–5.1)
Sodium: 142 mmol/L (ref 135–145)
TCO2: 49 mmol/L — ABNORMAL HIGH (ref 22–32)
pCO2, Ven: 85 mmHg (ref 44.0–60.0)
pH, Ven: 7.348 (ref 7.250–7.430)
pO2, Ven: 54 mmHg — ABNORMAL HIGH (ref 32.0–45.0)

## 2020-06-13 LAB — PROTIME-INR
INR: 1 (ref 0.8–1.2)
Prothrombin Time: 12.7 seconds (ref 11.4–15.2)

## 2020-06-13 LAB — TROPONIN I (HIGH SENSITIVITY)
Troponin I (High Sensitivity): 9 ng/L (ref <18)
Troponin I (High Sensitivity): 9 ng/L (ref <18)

## 2020-06-13 LAB — RESP PANEL BY RT-PCR (FLU A&B, COVID) ARPGX2
Influenza A by PCR: NEGATIVE
Influenza B by PCR: NEGATIVE
SARS Coronavirus 2 by RT PCR: NEGATIVE

## 2020-06-13 LAB — BRAIN NATRIURETIC PEPTIDE: B Natriuretic Peptide: 49.2 pg/mL (ref 0.0–100.0)

## 2020-06-13 MED ORDER — ALBUTEROL SULFATE HFA 108 (90 BASE) MCG/ACT IN AERS: 2.0000 | INHALATION_SPRAY | RESPIRATORY_TRACT | Status: DC | PRN

## 2020-06-13 MED ORDER — ACETAMINOPHEN 325 MG PO TABS
650.0000 mg | ORAL_TABLET | ORAL | Status: DC | PRN
  Administered 2020-06-14 – 2020-06-24 (×12): 650 mg via ORAL
  Filled 2020-06-13 (×12): qty 2

## 2020-06-13 MED ORDER — IPRATROPIUM BROMIDE 0.06 % NA SOLN
2.0000 | Freq: Every day | NASAL | Status: DC | PRN
  Administered 2020-06-14 – 2020-06-24 (×2): 2 via NASAL
  Filled 2020-06-13: qty 15

## 2020-06-13 MED ORDER — FUROSEMIDE 10 MG/ML IJ SOLN
40.0000 mg | Freq: Once | INTRAMUSCULAR | Status: AC
  Administered 2020-06-13: 19:00:00 40 mg via INTRAVENOUS
  Filled 2020-06-13: qty 4

## 2020-06-13 MED ORDER — ACETAMINOPHEN 10 MG/ML IV SOLN
1000.0000 mg | Freq: Once | INTRAVENOUS | Status: AC
  Administered 2020-06-13: 22:00:00 1000 mg via INTRAVENOUS
  Filled 2020-06-13: qty 100

## 2020-06-13 MED ORDER — IPRATROPIUM-ALBUTEROL 0.5-2.5 (3) MG/3ML IN SOLN
3.0000 mL | RESPIRATORY_TRACT | Status: DC | PRN
  Administered 2020-06-14: 05:00:00 3 mL via RESPIRATORY_TRACT
  Filled 2020-06-13: qty 3

## 2020-06-13 MED ORDER — METHYLPREDNISOLONE SODIUM SUCC 125 MG IJ SOLR: 125.0000 mg | Freq: Once | INTRAMUSCULAR | Status: DC

## 2020-06-13 MED ORDER — APIXABAN 5 MG PO TABS
5.0000 mg | ORAL_TABLET | Freq: Two times a day (BID) | ORAL | Status: DC
  Administered 2020-06-14 – 2020-06-26 (×25): 5 mg via ORAL
  Filled 2020-06-13 (×27): qty 1

## 2020-06-13 MED ORDER — GABAPENTIN 100 MG PO CAPS
200.0000 mg | ORAL_CAPSULE | Freq: Three times a day (TID) | ORAL | Status: DC
  Administered 2020-06-14 – 2020-06-26 (×38): 200 mg via ORAL
  Filled 2020-06-13 (×40): qty 2

## 2020-06-13 NOTE — ED Provider Notes (Signed)
[facility] EMERGENCY DEPARTMENT Provider Note   CSN: [id number] Arrival date & time: 06/13/20  1732     History No chief complaint on file.   [name] White is a 64 y.o. female.  HPI Patient reports that she is currently at a rehabilitation facility.  However, she advises that a situation has arisen upon when she is a cannot allow her to use her nighttime Trilogy BiPAP due to legal restrictions of the facility on trained staff.  Patient reports that they were trying to have her use there CPAP but it does not work for her and it just blows hot air in her face and makes it more difficult for her to breathe.  Therefore, she went without any BiPAP last night.  She reports she felt very short of breath today and wheezing.  She reports her chest feels tight due to her shortness of breath.  She denies she is any fever chills or body aches.  Patient reports she is fully immunized for Covid.  She reports has had multiple tests and is tested negative.  She reports just as of last night she started some increased coughing.  Cough has been mostly dry.  She reports she tried her albuterol inhaler last night and tried it twice a day but continue to get increasingly short of breath.  At baseline, the patient reports that she has very limited mobility.  She is only in a bed or in a chair.    Past Medical History:  Diagnosis Date   Abdominal wall hematoma 03/06/2019   Acute bronchitis 08/29/2018   Acute on chronic respiratory failure with hypoxia (HCC) 08/29/2018   Anxiety    Asthma    CHF (congestive heart failure) (HCC)    Chronic lower back pain    COPD (chronic obstructive pulmonary disease) ([place])    Depression    Diabetes mellitus without complication ([place])    Exposure to COVID-19 virus 11/02/2018   Fall 02/2019   Family history of anesthesia complication    "daughter; causes her to pass out afterwards"   GERD (gastroesophageal reflux disease)    History of atrial  flutter 06/26/2015   History of pulmonary embolus (PE) 11/03/2014   Hypertension    Hyperthyroidism    Left medial tibial stress syndrome 12/26/2017   Lower GI bleed    Migraine    "monthly" (12/28/2013)   Non-traumatic rhabdomyolysis 11/03/2014   Obesity hypoventilation syndrome ([place]) 03/06/2019   Osteoarthritis    "both knees; back of my neck; right pelvic bone" (12/28/2013)   Paroxysmal A-fib ([place])    Pulmonary embolism ([place]) 12/28/2013   "2 clots in each lung"    Patient Active Problem List   Diagnosis Date Noted   Altered mental status    Post-nasal drip 05/19/2020   Frequent falls 05/06/2020   Acute exacerbation of CHF (congestive heart failure) ([place]) 03/12/2020   Community acquired pneumonia    Acute respiratory failure with hypoxia ([place]) 02/06/2020   SOB (shortness of breath) 11/24/2019   Respiratory distress    Respiratory failure, acute-on-chronic ([place]) 11/09/2019   Seasonal allergies 10/03/2019   Debility 08/02/2019   Hypertension associated with diabetes ([place])    Weight gain due to medication 06/05/2019   Acute on chronic heart failure with preserved ejection fraction ([place]) 03/14/2019   Acquired thrombophilia ([place]) due to A-Fib 03/07/2019   Heart failure with preserved ejection fraction ([place]) 03/06/2019   Obesity hypoventilation syndrome ([place]) 03/06/2019   Lower GI bleed  COPD exacerbation ([place]) 02/26/2019   Urge incontinence 01/27/2019   Shortness of breath 08/29/2018   OSA (obstructive sleep apnea) 07/05/2018   Respiratory failure ([place]) 07/05/2018   Severe episode of recurrent major depressive disorder, without psychotic features ([place])    MDD (major depressive disorder), recurrent episode, severe ([place]) 09/07/2015   Atrial flutter ([place]) 07/29/2015   Asthma 11/12/2014   History of hypertension 11/05/2014   Paroxysmal atrial fibrillation ([place]) 11/03/2014   Chronic anticoagulation 11/03/2014   Major depressive disorder  11/03/2014   History of pulmonary embolus (PE) 11/03/2014   MDD (major depressive disorder), recurrent severe, without psychosis ([place]) 03/05/2014   Hot flashes 04/22/2011   OVERACTIVE BLADDER 04/18/2008   Osteoarthritis of both knees 04/18/2008   Osteoarthritis involving multiple joints on both sides of body 09/14/2007   Morbid obesity ([place]) 08/24/2006   RESTLESS LEGS SYNDROME 08/24/2006   HYPERTENSION, BENIGN SYSTEMIC 08/24/2006   RHINITIS, ALLERGIC 08/24/2006   GASTROESOPHAGEAL REFLUX, NO ESOPHAGITIS 08/24/2006    Past Surgical History:  Procedure Laterality Date   ABDOMINAL HYSTERECTOMY     APPENDECTOMY     BREAST CYST EXCISION Right    DILATION AND CURETTAGE OF UTERUS     ELECTROPHYSIOLOGIC STUDY N/A 05/27/2015   Procedure: A-Flutter;  Surgeon: Evans Lance, MD;  Location: [facility];  Service: Cardiovascular;  Laterality: N/A;   EXCISIONAL HEMORRHOIDECTOMY     NASAL SINUS SURGERY  2007   THYROIDECTOMY, PARTIAL Right 2005   TUBAL LIGATION     WISDOM TOOTH EXTRACTION       OB History   No obstetric history on file.     Family History  Problem Relation Age of Onset   Osteoarthritis Mother    Asthma Mother    Heart failure Mother    Breast cancer Daughter     Social History   Tobacco Use   Smoking status: Former Smoker    Packs/day: 0.25    Years: 20.00    Pack years: 5.00    Types: Cigarettes    Quit date: 07/17/1999    Years since quitting: 20.9   Smokeless tobacco: Never Used  Scientific laboratory technician Use: Never used  Substance Use Topics   Alcohol use: Not Currently    Comment: "drank some in my 67's"   Drug use: No    Home Medications Prior to Admission medications   Medication Sig Start Date End Date Taking? Authorizing Provider  acetaminophen (TYLENOL) 325 MG tablet Take 2 tablets (650 mg total) by mouth every 6 (six) hours as needed for mild pain. Patient taking differently: Take 650 mg by mouth every 6 (six)  hours as needed for mild pain or headache.  09/11/18   Georgette Shell, MD  albuterol (VENTOLIN HFA) 108 (90 Base) MCG/ACT inhaler Inhale 2 puffs into the lungs every 4 (four) hours as needed for wheezing or shortness of breath. 04/17/20   Anderson, Chelsey L, DO  apixaban (ELIQUIS) 5 MG TABS tablet Take 1 tablet (5 mg total) by mouth 2 (two) times daily. 12/31/19   Anderson, Chelsey L, DO  atorvastatin (LIPITOR) 40 MG tablet Take 1 tablet (40 mg total) by mouth daily. Patient taking differently: Take 40 mg by mouth every evening.  12/31/19   Anderson, Chelsey L, DO  azelastine (OPTIVAR) 0.05 % ophthalmic solution INSTILL 1 DROP IN BOTH EYES TWICE DAILY AS NEEDED FOR ITCHY EYES 05/18/20   Anderson, Chelsey L, DO  cetirizine (ZYRTEC) 10 MG tablet TAKE 1 TABLET(10 MG)  BY MOUTH DAILY Patient taking differently: Take 10 mg by mouth daily.  03/09/20   Anderson, Chelsey L, DO  diclofenac sodium (VOLTAREN) 1 % GEL APPLY 2 GRAMS EXTERNALLY TO THE AFFECTED AREA FOUR TIMES DAILY Patient taking differently: Apply 2 g topically 4 (four) times daily as needed (arthritis pain - knees, shoulder and thighs).  07/31/18   [name], MD  diltiazem (CARDIZEM CD) 120 MG 24 hr capsule TAKE 1 CAPSULE BY MOUTH ONCE DAILY 05/15/20   Baldwin Jamaica, PA-C  flecainide (TAMBOCOR) 100 MG tablet TAKE 1 TABLET BY MOUTH TWICE DAILY 03/31/20   Sherren Mocha, MD  fluticasone [facility]) 50 MCG/ACT nasal spray Place 1 spray into both nostrils daily. 05/29/20   Ezequiel Essex, MD  furosemide (LASIX) 40 MG tablet Take 1.5 tablets (60 mg total) by mouth daily. 05/15/20   Anderson, Chelsey L, DO  gabapentin (NEURONTIN) 100 MG capsule Take 2 capsules (200 mg total) by mouth 3 (three) times daily. 05/18/20   Anderson, Chelsey L, DO  hydrOXYzine (VISTARIL) 25 MG capsule TAKE 1 CAPSULE(25 MG) BY MOUTH DAILY AS NEEDED FOR ANXIETY Patient taking differently: Take 25 mg by mouth daily as needed for anxiety.  05/15/20   Anderson, Chelsey L,  DO  ipratropium (ATROVENT) 0.06 % nasal spray Place 2 sprays into both nostrils 4 (four) times daily. 05/18/20   Anderson, Chelsey L, DO  ipratropium-albuterol (DUONEB) 0.5-2.5 (3) MG/3ML SOLN Take 3 mLs by nebulization every 6 (six) hours as needed (shortness of breath, wheezing). 11/13/19   Wilber Oliphant, MD  Melatonin 3 MG TABS Take 1 tablet (3 mg total) by mouth at bedtime as needed (sleep). 07/07/19   Brimage, Ronnette Juniper, DO  metoprolol tartrate (LOPRESSOR) 25 MG tablet TAKE 1/2 TABLET(12.5 MG) BY MOUTH TWICE DAILY Patient taking differently: Take 12.5 mg by mouth 2 (two) times daily.  03/09/20   Doristine Mango L, DO  Multiple Vitamin (MULTIVITAMIN) tablet Take 1 tablet by mouth every evening.     [provider]  omeprazole (PRILOSEC) 20 MG capsule Take 1 capsule (20 mg total) by mouth daily. 03/16/20   Anderson, Chelsey L, DO  OXYGEN Inhale 6-7 L/min into the lungs continuous.     [provider]  polyethylene glycol (MIRALAX / GLYCOLAX) 17 g packet Take 17 g by mouth daily as needed for mild constipation or moderate constipation. 05/29/20   Ezequiel Essex, MD  potassium chloride (KLOR-CON) 10 MEQ tablet TAKE 2 TABLETS(20 MEQ) BY MOUTH DAILY Patient taking differently: Take 20 mEq by mouth daily.  11/26/19   [name], MD  PRESCRIPTION MEDICATION See admin instructions. Trilogy [employer]-  At bedtime and during any time of rest    [provider]  venlafaxine (EFFEXOR) 37.5 MG tablet Take 1 tablet (37.5 mg total) by mouth daily with breakfast. Take with a 150mg  ER capsule every morning for a total of 187.5mg  05/18/20   Anderson, Chelsey L, DO  venlafaxine XR (EFFEXOR-XR) 150 MG 24 hr capsule Take 1 capsule (150 mg total) by mouth daily with breakfast. Take with a 37.5 mg tablet every morning Patient taking differently: Take 150 mg by mouth daily with breakfast. Take with a 37.5mg  tablet every morning for a total of 187.5mg  12/16/19   [name], MD     Allergies    Caffeine, Hydralazine hcl, Hydrocodone, Ciprofloxacin, Erythromycin, Lisinopril, Oxycodone, Sulfamethoxazole-trimethoprim, and Tape  Review of Systems   Review of Systems 10 systems reviewed and negative except as per HPI Physical Exam  Updated Vital Signs BP (!) 159/98    Pulse 98    Temp 98.6 F (37 C) (Oral)    Resp (!) 25    Ht 5\' 5"  (1.651 m)    Wt (!) 162.3 kg    SpO2 98%    BMI 59.54 kg/m   Physical Exam Constitutional:      Comments: Patient is alert.  Mild increased work of breathing at rest.  Morbid obesity.  HENT:     Mouth/Throat:     Pharynx: Oropharynx is clear.  Eyes:     Extraocular Movements: Extraocular movements intact.  Cardiovascular:     Rate and Rhythm: Normal rate and regular rhythm.  Pulmonary:     Comments: Mild increased work of breathing.  Occasional dry cough.  Patient has breath sounds to the bases.  Occasional expiratory wheeze.  No rhonchi or crackle Abdominal:     General: There is no distension.     Palpations: Abdomen is soft.     Tenderness: There is no abdominal tenderness.  Musculoskeletal:     Comments: 1-2+ pitting edema bilateral ankles.  Skin:    General: Skin is warm and dry.  Neurological:     General: No focal deficit present.     Mental Status: She is oriented to person, place, and time.     Coordination: Coordination normal.     ED Results / Procedures / Treatments   Labs (all labs ordered are listed, but only abnormal results are displayed) Labs Reviewed  COMPREHENSIVE METABOLIC PANEL - Abnormal; Notable for the following components:      Result Value   Chloride 93 (*)    CO2 37 (*)    Glucose, Bld 136 (*)    BUN 7 (*)    Albumin 3.4 (*)    All other components within normal limits  CBC WITH DIFFERENTIAL/PLATELET - Abnormal; Notable for the following components:   Hemoglobin 10.9 (*)    MCHC 29.0 (*)    RDW 16.8 (*)    Platelets 563 (*)    All other components within normal limits  URINALYSIS,  ROUTINE W REFLEX MICROSCOPIC - Abnormal; Notable for the following components:   Color, Urine STRAW (*)    Hgb urine dipstick MODERATE (*)    All other components within normal limits  I-STAT VENOUS BLOOD GAS, ED - Abnormal; Notable for the following components:   pCO2, Ven 85.0 (*)    pO2, Ven 54.0 (*)    Bicarbonate 46.7 (*)    TCO2 49 (*)    Acid-Base Excess 17.0 (*)    All other components within normal limits  CULTURE, BLOOD (ROUTINE X 2)  CULTURE, BLOOD (ROUTINE X 2)  RESP PANEL BY RT-PCR (FLU A&B, COVID) ARPGX2  BRAIN NATRIURETIC PEPTIDE  LACTIC ACID, PLASMA  PROTIME-INR  LACTIC ACID, PLASMA  BLOOD GAS, VENOUS  TROPONIN I (HIGH SENSITIVITY)  TROPONIN I (HIGH SENSITIVITY)    EKG None  Radiology DG Chest Port 1 View  Result Date: 06/13/2020 CLINICAL DATA:  Short of breath, asthma, CHF EXAM: PORTABLE CHEST 1 VIEW COMPARISON:  05/24/2020 FINDINGS: Single frontal view of the chest demonstrates stable enlargement the cardiac silhouette. Increased density in the retrocardiac region unchanged since prior exam, consistent with hypoventilatory change. Chronic elevation of the right hemidiaphragm. No airspace disease, effusion, or pneumothorax. No acute bony abnormality. IMPRESSION: 1. Stable exam, no acute process. Electronically Signed   By: Randa Ngo M.D.   On: 06/13/2020 18:48    Procedures Procedures (  including critical care time) CRITICAL CARE Performed by: [name]   Total critical care time: 30 minutes  Critical care time was exclusive of separately billable procedures and treating other patients.  Critical care was necessary to treat or prevent imminent or life-threatening deterioration.  Critical care was time spent personally by me on the following activities: development of treatment plan with patient and/or surrogate as well as nursing, discussions with consultants, evaluation of patient's response to treatment, examination of patient, obtaining  history from patient or surrogate, ordering and performing treatments and interventions, ordering and review of laboratory studies, ordering and review of radiographic studies, pulse oximetry and re-evaluation of patient's condition. Medications Ordered in ED Medications  acetaminophen (TYLENOL) tablet 650 mg (has no administration in time range)  apixaban (ELIQUIS) tablet 5 mg (has no administration in time range)  gabapentin (NEURONTIN) capsule 200 mg (has no administration in time range)  ipratropium-albuterol (DUONEB) 0.5-2.5 (3) MG/3ML nebulizer solution 3 mL (has no administration in time range)  methylPREDNISolone sodium succinate (SOLU-MEDROL) 125 mg/2 mL injection 125 mg (has no administration in time range)  furosemide (LASIX) injection 40 mg (40 mg Intravenous Given 06/13/20 1927)    ED Course  I have reviewed the triage vital signs and the nursing notes.  Pertinent labs & imaging results that were available during my care of the patient were reviewed by me and considered in my medical decision making (see chart for details).    MDM Rules/Calculators/A&P                         Patient presents with acute on chronic hypercapnic respiratory failure.  This has been a recurrent condition for the patient.  She reports that last night she did not have her BiPAP.  Reportedly this is due to regulatory issues at the rehab facility.  Patient reports she is gotten increasingly short of breath and tightness in her chest.  Chest x-ray does not show any acute pneumonia.  No elevation in lactic acid or fever.  This times appears to be primarily respiratory failure due to chronic COPD and obesity hypoventilation.  Consult: Family practice medicine for admission  Final Clinical Impression(s) / ED Diagnoses Final diagnoses:  Acute on chronic respiratory failure with hypercapnia ([place])  Obesity hypoventilation syndrome ([place])  Severe comorbid illness    Rx / DC Orders ED Discharge Orders     None       [name], MD 06/13/20 1958

## 2020-06-13 NOTE — H&P (Signed)
[facility] Admission History and Physical Service Pager: [phone]  Patient name: [name] White Medical record number: [id number] Date of birth: [birth year] Age: 64 y.o. Gender: female  Primary Care Provider: Richarda Osmond, DO Consultants: None Code Status: DNR  Preferred Emergency Contact: Alta Corning (niece) [phone] or Rowan Blase (sister) [phone]  Chief Complaint: Shortness of breath  Assessment and Plan: EKATERINA DENISE is a 64 y.o. female with PMH significant for OHS OSA, HFpEF, HTN, paroxysmal A. fib on Eliquis, T2DM, MDD, GERD, osteoarthritis, morbid obesity, remote tobacco use with 20-pack-year cigarette history.  Acute on Chronic Hypoxic/Hypercapneic Respiratory failure- likely initiated from improper use and function of NIV at SNF. Less likely from acute infection or COPD exacerbation since she has no other ill symptoms and Cxray clear. Unlikely PE- adherent with eliquis. VBG on presentation showed CO2 retention greatly increased from baseline. Fortunately, she is alert and oriented. Endorses having intermittent sensation of "floating on clouds" while at SNF. Improved now that she is at the hospital. She is still feeling short of breath and speaking in short sentences. During my exam- she is on her baseline 6L [place] with severe wheezing worse on expiration.  Respiratory rate 22-31. -Admit to progressive, Dr. Andria Frames attending   -BiPAP (trilogy) nightly and PRN - continue home breathing treatments -Continue anticoagulation with home Eliquis 5 mg twice daily -PT/OT eval and treat  - TOC referral, SNF placement at facility that will have proper NIV of BiPAP or trilogy  Deconditioning-patient was currently in SNF for rehabilitation.  She has really enjoyed working with therapy for the last 2 weeks and had actually begun walking recently before this incident occurred.  Per patient, she was told that [facility] is a facility that would  allow her to use the type of NIV that she requires. -PT/OT, appreciate recs -TOC, appreciate placement help  HFpEF  Hypertension-chronic, stable.  Elevated blood pressure this admission which could be contributing to shortness of breath. Home medications include atorvastatin, Cardizem, metoprolol. -Continue home meds  Paroxysmal A. fib on Eliquis- HR within normal limits on admission At home takes Eliquis 5 mg BID, Cardizem & flecanide. -Continuous telemetry 24 to 48 hours -Continue home medications   Type 2 diabetes mellitus-diet controlled. Most recent A1c 6.4% (Nov 2021). Patient received Methyprednisolone on admission, so CBGs could be more elevated.   -monitor with BMP  a.m.  Thrombocytosis, chronic, stable - 563 platelets on admission - CBC am - monitor for bleeding  Normocytic Anemia - Chronic, stable On admission, hemoglobin 10.9 (at baseline), MCV 94.2.    Rhinorrhea-chronic, improved Patient with history of chronic nasal drainage. Was recently started on Atrovent nasal spray which she reports has stopped her nasal drainage -Atrovent as needed  MDD: Chronic, stable Home meds include venlafaxine total dose 187.5 mg daily. -Continue home med  GERD: Chronic, stable Home meds include omeprazole 20 mg daily. - Protonix formulary  Osteoarthritis: Chronic, stable Home meds include gabapentin 200 mg 3 times daily. -Continue home med  FEN/GI:  N.p.o. while on BiPAP, otherwise heart healthy diet Prophylaxis: On Eliquis  Disposition: SNF - [facility]?  History of Present Illness:  [name] White is a 64 y.o. female presenting with shortness of breath and hypoxia starting yesterday evening when her CPAP machine malfunctioned. Ms. Vorce had been doing very well at SNF with her rehabilitation and had even begun walking with assistance.  2 days ago she was informed by administration that she was no longer allowed to use  her home device for BiPAP at night due to the  facilities regulations.  She then used the facility CPAP machine which seemed like it malfunctioned last night per patient.  She subsequently had shortness of breath and feelings of floating on clouds this morning.  Her O2 saturations read as low as 83%.  She denies any other sick symptoms including nasal drainage, increased cough, chest pain, nausea or vomiting.  She does have a mild frontal headache. Patient would like to continue physical therapy at a rehab facility due to the great strides she was making for mobility however, she needs to go to a facility that will allow her to have a functioning BiPAP at night.  Review Of Systems: Per HPI with the following additions:  Review of Systems  Constitutional: Negative for fever.  HENT: Negative for postnasal drip and rhinorrhea.   Respiratory: Positive for cough, chest tightness and shortness of breath.   Cardiovascular: Negative for chest pain.  Gastrointestinal: Negative for abdominal pain, diarrhea, nausea and vomiting.     Patient Active Problem List   Diagnosis Date Noted  . Altered mental status   . Post-nasal drip 05/19/2020  . Frequent falls 05/06/2020  . Acute exacerbation of CHF (congestive heart failure) ([place]) 03/12/2020  . Community acquired pneumonia   . Acute respiratory failure with hypoxia ([place]) 02/06/2020  . SOB (shortness of breath) 11/24/2019  . Respiratory distress   . Respiratory failure, acute-on-chronic ([place]) 11/09/2019  . Seasonal allergies 10/03/2019  . Debility 08/02/2019  . Hypertension associated with diabetes ([place])   . Weight gain due to medication 06/05/2019  . Acute on chronic heart failure with preserved ejection fraction ([place]) 03/14/2019  . Acquired thrombophilia ([place]) due to A-Fib 03/07/2019  . Heart failure with preserved ejection fraction ([place]) 03/06/2019  . Obesity hypoventilation syndrome ([place]) 03/06/2019  . Lower GI bleed   . COPD exacerbation ([place]) 02/26/2019  . Urge incontinence 01/27/2019  .  Shortness of breath 08/29/2018  . OSA (obstructive sleep apnea) 07/05/2018  . Respiratory failure ([place]) 07/05/2018  . Severe episode of recurrent major depressive disorder, without psychotic features ([place])   . MDD (major depressive disorder), recurrent episode, severe ([place]) 09/07/2015  . Atrial flutter ([place]) 07/29/2015  . Asthma 11/12/2014  . History of hypertension 11/05/2014  . Paroxysmal atrial fibrillation ([place]) 11/03/2014  . Chronic anticoagulation 11/03/2014  . Major depressive disorder 11/03/2014  . History of pulmonary embolus (PE) 11/03/2014  . MDD (major depressive disorder), recurrent severe, without psychosis ([place]) 03/05/2014  . Hot flashes 04/22/2011  . OVERACTIVE BLADDER 04/18/2008  . Osteoarthritis of both knees 04/18/2008  . Osteoarthritis involving multiple joints on both sides of body 09/14/2007  . Morbid obesity ([place]) 08/24/2006  . RESTLESS LEGS SYNDROME 08/24/2006  . HYPERTENSION, BENIGN SYSTEMIC 08/24/2006  . RHINITIS, ALLERGIC 08/24/2006  . GASTROESOPHAGEAL REFLUX, NO ESOPHAGITIS 08/24/2006    Past Medical History: Past Medical History:  Diagnosis Date  . Abdominal wall hematoma 03/06/2019  . Acute bronchitis 08/29/2018  . Acute on chronic respiratory failure with hypoxia ([place]) 08/29/2018  . Anxiety   . Asthma   . CHF (congestive heart failure) ([place])   . Chronic lower back pain   . COPD (chronic obstructive pulmonary disease) ([place])   . Depression   . Diabetes mellitus without complication ([place])   . Exposure to COVID-19 virus 11/02/2018  . Fall 02/2019  . Family history of anesthesia complication    "daughter; causes her to pass out afterwards"  . GERD (gastroesophageal reflux disease)   .  History of atrial flutter 06/26/2015  . History of pulmonary embolus (PE) 11/03/2014  . Hypertension   . Hyperthyroidism   . Left medial tibial stress syndrome 12/26/2017  . Lower GI bleed   . Migraine    "monthly" (12/28/2013)  . Non-traumatic rhabdomyolysis 11/03/2014  .  Obesity hypoventilation syndrome ([place]) 03/06/2019  . Osteoarthritis    "both knees; back of my neck; right pelvic bone" (12/28/2013)  . Paroxysmal A-fib ([place])   . Pulmonary embolism ([place]) 12/28/2013   "2 clots in each lung"    Past Surgical History: Past Surgical History:  Procedure Laterality Date  . ABDOMINAL HYSTERECTOMY    . APPENDECTOMY    . BREAST CYST EXCISION Right   . DILATION AND CURETTAGE OF UTERUS    . ELECTROPHYSIOLOGIC STUDY N/A 05/27/2015   Procedure: A-Flutter;  Surgeon: Evans Lance, MD;  Location: [facility];  Service: Cardiovascular;  Laterality: N/A;  . EXCISIONAL HEMORRHOIDECTOMY    . NASAL SINUS SURGERY  2007  . THYROIDECTOMY, PARTIAL Right 2005  . TUBAL LIGATION    . WISDOM TOOTH EXTRACTION      Social History: Social History   Tobacco Use  . Smoking status: Former Smoker    Packs/day: 0.25    Years: 20.00    Pack years: 5.00    Types: Cigarettes    Quit date: 07/17/1999    Years since quitting: 20.9  . Smokeless tobacco: Never Used  Vaping Use  . Vaping Use: Never used  Substance Use Topics  . Alcohol use: Not Currently    Comment: "drank some in my 30's"  . Drug use: No   Additional social history: From SNF Former smoker Please also refer to relevant sections of EMR.  Family History: Family History  Problem Relation Age of Onset  . Osteoarthritis Mother   . Asthma Mother   . Heart failure Mother   . Breast cancer Daughter     Allergies and Medications: Allergies  Allergen Reactions  . Caffeine Other (See Comments)    Migraine  . Hydralazine Hcl Other (See Comments)    AKI leading to rhabdo and electrolyte abnormalities  . Hydrocodone Nausea And Vomiting    Headache, also  . Ciprofloxacin Hives and Rash  . Erythromycin Hives and Rash  . Lisinopril Cough  . Oxycodone Nausea And Vomiting and Other (See Comments)    Headaches, also  . Sulfamethoxazole-Trimethoprim Rash  . Tape Rash and Other (See Comments)   No current  facility-administered medications on file prior to encounter.   Current Outpatient Medications on File Prior to Encounter  Medication Sig Dispense Refill  . acetaminophen (TYLENOL) 325 MG tablet Take 2 tablets (650 mg total) by mouth every 6 (six) hours as needed for mild pain. (Patient taking differently: Take 650 mg by mouth every 6 (six) hours as needed for mild pain or headache. )    . albuterol (VENTOLIN HFA) 108 (90 Base) MCG/ACT inhaler Inhale 2 puffs into the lungs every 4 (four) hours as needed for wheezing or shortness of breath. 18 g 1  . apixaban (ELIQUIS) 5 MG TABS tablet Take 1 tablet (5 mg total) by mouth 2 (two) times daily. 180 tablet 1  . atorvastatin (LIPITOR) 40 MG tablet Take 1 tablet (40 mg total) by mouth daily. (Patient taking differently: Take 40 mg by mouth every evening. ) 90 tablet 3  . azelastine (OPTIVAR) 0.05 % ophthalmic solution INSTILL 1 DROP IN BOTH EYES TWICE DAILY AS NEEDED FOR ITCHY EYES  6 mL 1  . cetirizine (ZYRTEC) 10 MG tablet TAKE 1 TABLET(10 MG) BY MOUTH DAILY (Patient taking differently: Take 10 mg by mouth daily. ) 30 tablet 1  . diclofenac sodium (VOLTAREN) 1 % GEL APPLY 2 GRAMS EXTERNALLY TO THE AFFECTED AREA FOUR TIMES DAILY (Patient taking differently: Apply 2 g topically 4 (four) times daily as needed (arthritis pain - knees, shoulder and thighs). ) 100 g 0  . diltiazem (CARDIZEM CD) 120 MG 24 hr capsule TAKE 1 CAPSULE BY MOUTH ONCE DAILY 30 capsule 0  . flecainide (TAMBOCOR) 100 MG tablet TAKE 1 TABLET BY MOUTH TWICE DAILY 180 tablet 3  . fluticasone (FLONASE) 50 MCG/ACT nasal spray Place 1 spray into both nostrils daily. 3 mL 0  . furosemide (LASIX) 40 MG tablet Take 1.5 tablets (60 mg total) by mouth daily. 180 tablet 1  . gabapentin (NEURONTIN) 100 MG capsule Take 2 capsules (200 mg total) by mouth 3 (three) times daily. 180 capsule 1  . hydrOXYzine (VISTARIL) 25 MG capsule TAKE 1 CAPSULE(25 MG) BY MOUTH DAILY AS NEEDED FOR ANXIETY (Patient taking  differently: Take 25 mg by mouth daily as needed for anxiety. ) 60 capsule 0  . ipratropium (ATROVENT) 0.06 % nasal spray Place 2 sprays into both nostrils 4 (four) times daily. 15 mL 12  . ipratropium-albuterol (DUONEB) 0.5-2.5 (3) MG/3ML SOLN Take 3 mLs by nebulization every 6 (six) hours as needed (shortness of breath, wheezing). 360 mL 0  . Melatonin 3 MG TABS Take 1 tablet (3 mg total) by mouth at bedtime as needed (sleep). 30 tablet 0  . metoprolol tartrate (LOPRESSOR) 25 MG tablet TAKE 1/2 TABLET(12.5 MG) BY MOUTH TWICE DAILY (Patient taking differently: Take 12.5 mg by mouth 2 (two) times daily. ) 90 tablet 0  . Multiple Vitamin (MULTIVITAMIN) tablet Take 1 tablet by mouth every evening.     Marland Kitchen omeprazole (PRILOSEC) 20 MG capsule Take 1 capsule (20 mg total) by mouth daily. 90 capsule 0  . OXYGEN Inhale 6-7 L/min into the lungs continuous.     . polyethylene glycol (MIRALAX / GLYCOLAX) 17 g packet Take 17 g by mouth daily as needed for mild constipation or moderate constipation. 14 each 0  . potassium chloride (KLOR-CON) 10 MEQ tablet TAKE 2 TABLETS(20 MEQ) BY MOUTH DAILY (Patient taking differently: Take 20 mEq by mouth daily. ) 180 tablet 0  . PRESCRIPTION MEDICATION See admin instructions. Trilogy [employer]-  At bedtime and during any time of rest    . venlafaxine (EFFEXOR) 37.5 MG tablet Take 1 tablet (37.5 mg total) by mouth daily with breakfast. Take with a 150mg  ER capsule every morning for a total of 187.5mg  90 tablet 2  . venlafaxine XR (EFFEXOR-XR) 150 MG 24 hr capsule Take 1 capsule (150 mg total) by mouth daily with breakfast. Take with a 37.5 mg tablet every morning (Patient taking differently: Take 150 mg by mouth daily with breakfast. Take with a 37.5mg  tablet every morning for a total of 187.5mg ) 90 capsule 3    Objective: BP (!) 140/112   Pulse 96   Temp 98.6 F (37 C) (Oral)   Resp (!) 27   Ht 5\' 5"  (1.651 m)   Wt (!) 162.3 kg   SpO2 95%   BMI 59.54  kg/m  Exam: General: Sitting up in bed, not in acute distress ENTM: Negative nasal drainage, moist mucous membranes Neck: Negative cervical lymphadenopathy or thyromegaly Cardiovascular: Regular rate and rhythm, normal S1, S2,  no murmurs appreciated. Respiratory: Decreased aeration to bilateral bases, wheezing with expiration which sounds to be more upper airway.  Patient speaking in short sentences whereas she can typically speak in multiple full sentences.  Increased work of breathing and accessory muscle use. Gastrointestinal: Soft abdomen without tenderness to palpation. MSK: Obese, negative lower extremity edema Derm: No rashes or lesions Neuro: Alert and oriented x4, no focal deficits Psych: Normal mood and affect  Labs and Imaging: CBC BMET  Recent Labs  Lab 06/13/20 1822 06/13/20 1847  WBC 9.1  --   HGB 10.9* 12.9  HCT 37.6 38.0  PLT 563*  --    Recent Labs  Lab 06/13/20 1822 06/13/20 1847  NA 141 142  K 4.0 4.2  CL 93*  --   CO2 37*  --   BUN 7*  --   CREATININE 0.53  --   GLUCOSE 136*  --   CALCIUM 9.4  --      EKG: normal rate, irregular rhythm, a lot of artifacts and breathing, repeat in am  DG Chest Port 1 View  Result Date: 06/13/2020 CLINICAL DATA:  Short of breath, asthma, CHF EXAM: PORTABLE CHEST 1 VIEW COMPARISON:  05/24/2020 FINDINGS: Single frontal view of the chest demonstrates stable enlargement the cardiac silhouette. Increased density in the retrocardiac region unchanged since prior exam, consistent with hypoventilatory change. Chronic elevation of the right hemidiaphragm. No airspace disease, effusion, or pneumothorax. No acute bony abnormality. IMPRESSION: 1. Stable exam, no acute process. Electronically Signed   By: Randa Ngo M.D.   On: 06/13/2020 18:48     Richarda Osmond, DO 06/13/2020, 8:05 PM PGY-3, [place] Intern pager: [phone], text pages welcome

## 2020-06-13 NOTE — ED Notes (Signed)
Pt brought to ED by [facility] from [facility] care SNF for increase SOB for the past 2 days getting worse today, 125 mg Solumedrol IV and 2 g Mg IV given by EMS pta.

## 2020-06-14 ENCOUNTER — Other Ambulatory Visit: Payer: Self-pay | Admitting: Student in an Organized Health Care Education/Training Program

## 2020-06-14 DIAGNOSIS — R69 Illness, unspecified: Secondary | ICD-10-CM | POA: Diagnosis present

## 2020-06-14 MED ORDER — DILTIAZEM HCL ER COATED BEADS 120 MG PO CP24
120.0000 mg | ORAL_CAPSULE | Freq: Every day | ORAL | Status: DC
  Administered 2020-06-14 – 2020-06-26 (×13): 120 mg via ORAL
  Filled 2020-06-14 (×13): qty 1

## 2020-06-14 MED ORDER — IPRATROPIUM BROMIDE 0.06 % NA SOLN
2.0000 | Freq: Four times a day (QID) | NASAL | Status: DC
  Administered 2020-06-14 – 2020-06-26 (×28): 2 via NASAL
  Filled 2020-06-14: qty 15

## 2020-06-14 MED ORDER — DICLOFENAC SODIUM 1 % TD GEL
2.0000 g | Freq: Four times a day (QID) | TRANSDERMAL | Status: DC | PRN
  Administered 2020-06-17 – 2020-06-24 (×7): 2 g via TOPICAL
  Filled 2020-06-14 (×2): qty 100

## 2020-06-14 MED ORDER — VENLAFAXINE HCL 37.5 MG PO TABS
37.5000 mg | ORAL_TABLET | Freq: Every day | ORAL | Status: DC
Start: 2020-06-14 — End: 2020-06-27
  Administered 2020-06-14 – 2020-06-26 (×12): 37.5 mg via ORAL
  Filled 2020-06-14 (×15): qty 1

## 2020-06-14 MED ORDER — MELATONIN 3 MG PO TABS
3.0000 mg | ORAL_TABLET | Freq: Every day | ORAL | Status: DC
  Administered 2020-06-14 – 2020-06-25 (×12): 3 mg via ORAL
  Filled 2020-06-14 (×12): qty 1

## 2020-06-14 MED ORDER — CYCLOBENZAPRINE HCL 5 MG PO TABS
5.0000 mg | ORAL_TABLET | Freq: Three times a day (TID) | ORAL | Status: DC | PRN
  Administered 2020-06-15 – 2020-06-25 (×18): 5 mg via ORAL
  Filled 2020-06-14 (×19): qty 1

## 2020-06-14 MED ORDER — PANTOPRAZOLE SODIUM 40 MG PO TBEC
40.0000 mg | DELAYED_RELEASE_TABLET | Freq: Every day | ORAL | Status: DC
  Administered 2020-06-14 – 2020-06-26 (×13): 40 mg via ORAL
  Filled 2020-06-14 (×13): qty 1

## 2020-06-14 MED ORDER — ALBUTEROL SULFATE HFA 108 (90 BASE) MCG/ACT IN AERS: 2.0000 | INHALATION_SPRAY | RESPIRATORY_TRACT | Status: DC | PRN

## 2020-06-14 MED ORDER — POTASSIUM CHLORIDE CRYS ER 20 MEQ PO TBCR
20.0000 meq | EXTENDED_RELEASE_TABLET | Freq: Every day | ORAL | Status: DC
  Administered 2020-06-14 – 2020-06-26 (×13): 20 meq via ORAL
  Filled 2020-06-14 (×5): qty 1
  Filled 2020-06-14: qty 2
  Filled 2020-06-14 (×9): qty 1

## 2020-06-14 MED ORDER — FLUTICASONE PROPIONATE 50 MCG/ACT NA SUSP
1.0000 | Freq: Every day | NASAL | Status: DC
  Administered 2020-06-14 – 2020-06-26 (×14): 1 via NASAL
  Filled 2020-06-14: qty 16

## 2020-06-14 MED ORDER — ATORVASTATIN CALCIUM 40 MG PO TABS
40.0000 mg | ORAL_TABLET | Freq: Every day | ORAL | Status: DC
  Administered 2020-06-14 – 2020-06-25 (×12): 40 mg via ORAL
  Filled 2020-06-14 (×12): qty 1

## 2020-06-14 MED ORDER — ADULT MULTIVITAMIN W/MINERALS CH
1.0000 | ORAL_TABLET | Freq: Every evening | ORAL | Status: DC
  Administered 2020-06-14 – 2020-06-26 (×13): 1 via ORAL
  Filled 2020-06-14 (×13): qty 1

## 2020-06-14 MED ORDER — KETOTIFEN FUMARATE 0.025 % OP SOLN
1.0000 [drp] | Freq: Every day | OPHTHALMIC | Status: DC | PRN
  Administered 2020-06-17 – 2020-06-25 (×6): 1 [drp] via OPHTHALMIC
  Filled 2020-06-14 (×2): qty 5

## 2020-06-14 MED ORDER — POLYETHYLENE GLYCOL 3350 17 G PO PACK: 17.0000 g | PACK | Freq: Every day | ORAL | Status: DC | PRN

## 2020-06-14 MED ORDER — FLECAINIDE ACETATE 100 MG PO TABS
100.0000 mg | ORAL_TABLET | Freq: Two times a day (BID) | ORAL | Status: DC
  Administered 2020-06-14 – 2020-06-26 (×25): 100 mg via ORAL
  Filled 2020-06-14 (×26): qty 1

## 2020-06-14 MED ORDER — IPRATROPIUM-ALBUTEROL 0.5-2.5 (3) MG/3ML IN SOLN
3.0000 mL | Freq: Four times a day (QID) | RESPIRATORY_TRACT | Status: DC | PRN
  Administered 2020-06-14 – 2020-06-15 (×3): 3 mL via RESPIRATORY_TRACT
  Filled 2020-06-14 (×3): qty 3

## 2020-06-14 MED ORDER — APIXABAN 5 MG PO TABS: 5.0000 mg | ORAL_TABLET | Freq: Two times a day (BID) | ORAL | Status: DC

## 2020-06-14 MED ORDER — LORATADINE 10 MG PO TABS
10.0000 mg | ORAL_TABLET | Freq: Every day | ORAL | Status: DC
  Administered 2020-06-14 – 2020-06-26 (×13): 10 mg via ORAL
  Filled 2020-06-14 (×13): qty 1

## 2020-06-14 MED ORDER — GABAPENTIN 100 MG PO CAPS: 200.0000 mg | ORAL_CAPSULE | Freq: Three times a day (TID) | ORAL | Status: DC

## 2020-06-14 MED ORDER — VENLAFAXINE HCL ER 75 MG PO CP24
150.0000 mg | ORAL_CAPSULE | Freq: Every day | ORAL | Status: DC
Start: 2020-06-15 — End: 2020-06-27
  Administered 2020-06-15 – 2020-06-26 (×12): 150 mg via ORAL
  Filled 2020-06-14 (×12): qty 2

## 2020-06-14 MED ORDER — HYDROXYZINE HCL 25 MG PO TABS
25.0000 mg | ORAL_TABLET | Freq: Every day | ORAL | Status: DC | PRN
  Administered 2020-06-15 – 2020-06-25 (×11): 25 mg via ORAL
  Filled 2020-06-14 (×11): qty 1

## 2020-06-14 MED ORDER — METOPROLOL TARTRATE 12.5 MG HALF TABLET
12.5000 mg | ORAL_TABLET | Freq: Two times a day (BID) | ORAL | Status: DC
  Administered 2020-06-14 – 2020-06-26 (×25): 12.5 mg via ORAL
  Filled 2020-06-14 (×25): qty 1

## 2020-06-14 MED ORDER — FUROSEMIDE 40 MG PO TABS
60.0000 mg | ORAL_TABLET | Freq: Every day | ORAL | Status: DC
  Administered 2020-06-14 – 2020-06-26 (×13): 60 mg via ORAL
  Filled 2020-06-14 (×13): qty 1

## 2020-06-14 NOTE — Progress Notes (Signed)
Family Medicine Teaching Service Daily Progress Note Intern Pager: [phone]  Patient name: [name] White Medical record number: [id number] Date of birth: 04/13/1956 Age: 64 y.o. Gender: female  Primary Care Provider: Richarda Osmond, DO Consultants: TOC Code Status: DNR  Pt Overview and Major Events to Date:  12/18 admitted for acute respiratory failure  Assessment and Plan: [name] White a 64 y.o.femalewith PMH significant forOHS OSA, HFpEF, HTN, paroxysmal A. fib on Eliquis, T2DM,MDD, GERD, osteoarthritis, morbid obesity,remote tobacco use with 20-pack-year cigarette history.  Acute on Chronic Hypoxic/Hypercapneic Respiratory failure-  -BiPAP (trilogy) nightly and PRN - continue home breathing treatments -PT/OT eval and treat  - TOC referral, SNF placement at facility that will have proper NIV of BiPAP or trilogy  Deconditioning- -PT/OT, appreciate recs -TOC, appreciate placement help  HFpEFHypertension-chronic, stable. BP has been elevated this admission likely from missing home medications during presentation.  Home medications includeatorvastatin, Cardizem, metoprolol. -Continue home meds  Paroxysmal A. fib on Eliquis- rate controlled typically. HR elevated on admission likely related to acute respiratory distress as well as missing rate control medications on presentation. -Continuous telemetry 24 to 48 hours -Continuehome medications  Type 2 diabetes mellitus-glucose 136 today. likely going to be elevated with steroid administration  -monitor with BMP a.m.  Thrombocytosis, chronic, stable - 563 platelets - CBC am - monitor for bleeding  Normocytic Anemia - Chronic, stable- Hgb 10.9 today - CBC am  Rhinorrhea-chronic, improved Patient with history of chronic nasal drainage. Was recently started on Atrovent nasal spray which she reports has stopped her nasal drainage -Atrovent nasal spray PRN  MDD: Chronic, stable Home meds  includevenlafaxine total dose 187.5 mg daily. -Continue home med  GERD: Chronic, stable Home meds includeomeprazole 20 mg daily. - Protonix formulary  Osteoarthritis: Chronic, stable Home meds includegabapentin 200 mg 3 times daily. -Continue home med  FEN/GI: N.p.o. while on BiPAP, otherwise heart healthy diet Prophylaxis:On Eliquis  Disposition: SNF - [facility]? Status is: Inpatient  Remains inpatient appropriate because:Inpatient level of care appropriate due to severity of illness   Dispo: The patient is from: SNF              Anticipated d/c is to: SNF              Anticipated d/c date is: 2 days              Patient currently is not medically stable to d/c.   Subjective:  Patient complained of poor sleep the past two days due to not having proper bipap use and was extremely tired. On my exam today, she was sleeping with bipap in place and seemed very peaceful without labored breathing or stertor. Lungs sounded clear. Did not disturb her from sleep.   Objective: Temp:  [98.6 F (37 C)-98.7 F (37.1 C)] 98.7 F (37.1 C) (12/19 0356) Pulse Rate:  [91-127] 99 (12/19 0700) Resp:  [16-31] 18 (12/19 0700) BP: (124-187)/(76-125) 156/88 (12/19 0356) SpO2:  [95 %-100 %] 98 % (12/19 0700) FiO2 (%):  [40 %] 40 % (12/19 0457) Weight:  [160.2 kg-162.3 kg] 160.2 kg (12/19 0356) Physical Exam: General: resting comfortably and sleeping with bipap, NAD Cardiovascular: fast rate, no murmur appreciated Respiratory: CTAB Abdomen: soft, non-distended Extremities: no LE edema  Laboratory: Recent Labs  Lab 06/13/20 1822 06/13/20 1847  WBC 9.1  --   HGB 10.9* 12.9  HCT 37.6 38.0  PLT 563*  --    Recent Labs  Lab 06/13/20 1822 06/13/20 1847  NA  141 142  K 4.0 4.2  CL 93*  --   CO2 37*  --   BUN 7*  --   CREATININE 0.53  --   CALCIUM 9.4  --   PROT 7.9  --   BILITOT 0.5  --   ALKPHOS 99  --   ALT 21  --   AST 22  --   GLUCOSE 136*  --    troponins trended  neg resp panel neg covid or flu x2 Urinalysis neg infection  Imaging/Diagnostic Tests: DG Chest Port 1 View  Result Date: 06/13/2020 CLINICAL DATA:  Short of breath, asthma, CHF EXAM: PORTABLE CHEST 1 VIEW COMPARISON:  05/24/2020 FINDINGS: Single frontal view of the chest demonstrates stable enlargement the cardiac silhouette. Increased density in the retrocardiac region unchanged since prior exam, consistent with hypoventilatory change. Chronic elevation of the right hemidiaphragm. No airspace disease, effusion, or pneumothorax. No acute bony abnormality. IMPRESSION: 1. Stable exam, no acute process. Electronically Signed   By: Randa Ngo M.D.   On: 06/13/2020 18:48     Richarda Osmond, DO 06/14/2020, 8:08 AM PGY-3, [place] Intern pager: [phone], text pages welcome

## 2020-06-14 NOTE — Progress Notes (Signed)
Patient requests bipap, bipap placed and report received from previous RN. Will resume care and continue to monitor.

## 2020-06-14 NOTE — Plan of Care (Signed)
  Problem: Education: Goal: Knowledge of General Education information will improve Description: Including pain rating scale, medication(s)/side effects and non-pharmacologic comfort measures Outcome: Progressing   Problem: Coping: Goal: Level of anxiety will decrease Outcome: Progressing   

## 2020-06-14 NOTE — Progress Notes (Signed)
Requested to take meds after breakfast only. Verbal order for diet placed per Dr, Posey Pronto , patient not on Bi-Pap anymore.

## 2020-06-14 NOTE — ED Notes (Signed)
C/o being cold  Blankets given and room temp adjusted

## 2020-06-14 NOTE — Evaluation (Signed)
Physical Therapy Evaluation Patient Details Name: [name] White MRN: [id number] DOB: October 01, 1955 Today's Date: 06/14/2020   History of Present Illness  64 y.o. female with PMH significant for OHS OSA, HFpEF, HTN, paroxysmal A. fib on Eliquis, T2DM, MDD, GERD, osteoarthritis, morbid obesity, remote tobacco use. Pt was at SNF and became SOB and hypoxic. Pt with frequent readmissions for respiratory failure.  Clinical Impression  Pt presents to PT with deficits in functional mobility, gait, balance, endurance, cardiopulmonary function, and power. Pt has shown improvement form previous admission and is able to ambulate short distances with use of RW at this time, however the pt continues to require assistance with transfers and is unable to tolerate activity well enough to manage mobility and ADLs at home alone for most of the day with her niece at work. Pt will benefit from continued acute PT POC and a return to SNF to continue progressing her mobility with the hope of returning to a modI level for household mobility.    Follow Up Recommendations SNF;Supervision/Assistance - 24 hour    Equipment Recommendations  None recommended by PT    Recommendations for Other Services       Precautions / Restrictions Precautions Precautions: Fall Precaution Comments: monitor SpO2 Restrictions Weight Bearing Restrictions: No      Mobility  Bed Mobility Overal bed mobility: Needs Assistance Bed Mobility: Supine to Sit;Sit to Supine     Supine to sit: Supervision;HOB elevated Sit to supine: Supervision;HOB elevated        Transfers Overall transfer level: Needs assistance Equipment used: Rolling walker (2 wheeled) Transfers: Sit to/from Stand Sit to Stand: Min assist            Ambulation/Gait Ambulation/Gait assistance: Min guard Gait Distance (Feet): 14 Feet (14' x3 trials) Assistive device: Rolling walker (2 wheeled) Gait Pattern/deviations: Step-to pattern Gait velocity:  reduced Gait velocity interpretation: <1.31 ft/sec, indicative of household ambulator General Gait Details: pt with short step-to gait, widened BOS and reduced gait speed  Stairs            Wheelchair Mobility    Modified Rankin (Stroke Patients Only)       Balance Overall balance assessment: Needs assistance Sitting-balance support: No upper extremity supported;Feet supported Sitting balance-Leahy Scale: Good     Standing balance support: Bilateral upper extremity supported Standing balance-Leahy Scale: Poor Standing balance comment: reliant on UE support of RW                             Pertinent Vitals/Pain Pain Assessment: No/denies pain    Home Living Family/patient expects to be discharged to:: Skilled nursing facility                      Prior Function Level of Independence: Needs assistance   Gait / Transfers Assistance Needed: pt ambulates short household distances with use of walker  ADL's / Homemaking Assistance Needed: niece completes all IADLs. Niece sets up pt for sponge bathing because pt unable to walk to bathroom recently. Pivots to [facility] for toileting at home. Assistance for bathing back, feet        Hand Dominance   Dominant Hand: Right    Extremity/Trunk Assessment   Upper Extremity Assessment Upper Extremity Assessment: Overall WFL for tasks assessed    Lower Extremity Assessment Lower Extremity Assessment: Generalized weakness    Cervical / Trunk Assessment Cervical / Trunk Assessment: Other exceptions Cervical / Trunk  Exceptions: excess body habitus  Communication   Communication: No difficulties  Cognition Arousal/Alertness: Awake/alert Behavior During Therapy: WFL for tasks assessed/performed Overall Cognitive Status: Within Functional Limits for tasks assessed                                        General Comments General comments (skin integrity, edema, etc.): pt on 7L HFNC during  session, sats stable in mid to high 90s, pt does still fatigue quickly with increased RR noted    Exercises     Assessment/Plan    PT Assessment Patient needs continued PT services  PT Problem List Decreased strength;Decreased balance;Decreased activity tolerance;Decreased mobility;Cardiopulmonary status limiting activity;Decreased cognition;Obesity       PT Treatment Interventions DME instruction;Gait training;Functional mobility training;Therapeutic activities;Therapeutic exercise;Balance training;Patient/family education    PT Goals (Current goals can be found in the Care Plan section)  Acute Rehab PT Goals Patient Stated Goal: to return to rehab and continue progress with mobility PT Goal Formulation: With patient Time For Goal Achievement: 06/28/20 Potential to Achieve Goals: Good    Frequency Min 3X/week (SNF rec but this pt is progressing and is a chronic re-admit. Seeing more often may lead to improved gait quality, quality of life, and reduced readmission rates)   Barriers to discharge        Co-evaluation               AM-PAC PT "6 Clicks" Mobility  Outcome Measure Help needed turning from your back to your side while in a flat bed without using bedrails?: A Little Help needed moving from lying on your back to sitting on the side of a flat bed without using bedrails?: A Little Help needed moving to and from a bed to a chair (including a wheelchair)?: A Little Help needed standing up from a chair using your arms (e.g., wheelchair or bedside chair)?: A Little Help needed to walk in hospital room?: A Little Help needed climbing 3-5 steps with a railing? : A Lot 6 Click Score: 17    End of Session Equipment Utilized During Treatment: Oxygen;Gait belt Activity Tolerance: Patient limited by fatigue Patient left: in bed;with call bell/phone within reach;with bed alarm set (pt declines OOB to chair) Nurse Communication: Mobility status PT Visit Diagnosis: Other  abnormalities of gait and mobility (R26.89);Muscle weakness (generalized) (M62.81)    Time: 5053-9767 PT Time Calculation (min) (ACUTE ONLY): 31 min   Charges:   PT Evaluation $PT Eval Low Complexity: 1 Low PT Treatments $Gait Training: 8-22 mins        Zenaida Niece, PT, DPT Acute Rehabilitation Pager: [phone]   Zenaida Niece 06/14/2020, 3:25 PM

## 2020-06-14 NOTE — Progress Notes (Signed)
Patient stated she is missing some medications on her medication list, paged MD to check and order medication requested.

## 2020-06-15 MED ORDER — IPRATROPIUM-ALBUTEROL 0.5-2.5 (3) MG/3ML IN SOLN
3.0000 mL | Freq: Four times a day (QID) | RESPIRATORY_TRACT | Status: DC
  Administered 2020-06-15 – 2020-06-19 (×14): 3 mL via RESPIRATORY_TRACT
  Filled 2020-06-15 (×14): qty 3

## 2020-06-15 MED ORDER — ALBUTEROL SULFATE (2.5 MG/3ML) 0.083% IN NEBU
2.5000 mg | INHALATION_SOLUTION | RESPIRATORY_TRACT | Status: DC | PRN
  Administered 2020-06-17 – 2020-06-26 (×5): 2.5 mg via RESPIRATORY_TRACT
  Filled 2020-06-15 (×5): qty 3

## 2020-06-15 MED ORDER — GUAIFENESIN ER 600 MG PO TB12
600.0000 mg | ORAL_TABLET | Freq: Two times a day (BID) | ORAL | Status: DC
  Administered 2020-06-15 – 2020-06-23 (×17): 600 mg via ORAL
  Filled 2020-06-15 (×17): qty 1

## 2020-06-15 MED ORDER — ALBUTEROL SULFATE (2.5 MG/3ML) 0.083% IN NEBU
INHALATION_SOLUTION | RESPIRATORY_TRACT | Status: AC
  Administered 2020-06-15: 2.5 mg
  Filled 2020-06-15: qty 3

## 2020-06-15 NOTE — TOC Initial Note (Addendum)
Transition of Care [facility]) - Initial/Assessment Note    Patient Details  Name: [name] White MRN: [id number] Date of Birth: Sep 15, 1955  Transition of Care [facility]) CM/SW Contact:    Bethann Berkshire, [place] Phone Number: 06/15/2020, 11:41 AM  Clinical Narrative:                  CSW spoke with Claiborne Billings at [facility] and is informed they are unable to accept pt back. They stated that pt has not been doing well on BIPAP and they are unable to accept Trilogy due to it using vent settings.   CSW spoke with pt who would like to return to [facility]. She is upset that she cannot return as she was happy with the therapy she received there. Pt is agreeable to seek different facilities. She expresses preference for [facility]. Pt also provides her case manager from PCP office, Tracy[facility], phone number; [phone]  [phone]: CSW called case manager Olivia Mackie and left voicemail requesting return call.   CSW spoke with Montenegro at [facility]. They stated they do not accept [facility] insurance  Expected Discharge Plan: Skilled Nursing Facility Barriers to Discharge: Continued Medical Work up,No SNF bed   Patient Goals and CMS Choice        Expected Discharge Plan and Services Expected Discharge Plan: [place]                                              Prior Living Arrangements/Services       Do you feel safe going back to the place where you live?: Yes               Activities of Daily Living      Permission Sought/Granted                  Emotional Assessment Appearance:: Appears stated age Attitude/Demeanor/Rapport: Engaged Affect (typically observed): Appropriate Orientation: : Oriented to Self,Oriented to Place,Oriented to  Time,Oriented to Situation Alcohol / Substance Use: Not Applicable Psych Involvement: No (comment)  Admission diagnosis:  SOB (shortness of breath) [R06.02] Obesity hypoventilation syndrome (HCC) [E66.2] Acute on chronic respiratory failure with  hypercapnia (HCC) [J96.22] Severe comorbid illness [R69] Patient Active Problem List   Diagnosis Date Noted  . Severe comorbid illness   . Altered mental status   . Post-nasal drip 05/19/2020  . Frequent falls 05/06/2020  . Acute exacerbation of CHF (congestive heart failure) ([place]) 03/12/2020  . Community acquired pneumonia   . Acute respiratory failure with hypoxia ([place]) 02/06/2020  . SOB (shortness of breath) 11/24/2019  . Respiratory distress   . Respiratory failure, acute-on-chronic ([place]) 11/09/2019  . Seasonal allergies 10/03/2019  . Debility 08/02/2019  . Hypertension associated with diabetes ([place])   . Weight gain due to medication 06/05/2019  . Acute on chronic heart failure with preserved ejection fraction ([place]) 03/14/2019  . Acquired thrombophilia ([place]) due to A-Fib 03/07/2019  . Heart failure with preserved ejection fraction ([place]) 03/06/2019  . Obesity hypoventilation syndrome ([place]) 03/06/2019  . Lower GI bleed   . COPD exacerbation ([place]) 02/26/2019  . Urge incontinence 01/27/2019  . Shortness of breath 08/29/2018  . OSA (obstructive sleep apnea) 07/05/2018  . Respiratory failure ([place]) 07/05/2018  . Severe episode of recurrent major depressive disorder, without psychotic features ([place])   . MDD (major depressive disorder), recurrent episode, severe ([place]) 09/07/2015  .  Atrial flutter ([place]) 07/29/2015  . Asthma 11/12/2014  . History of hypertension 11/05/2014  . Paroxysmal atrial fibrillation ([place]) 11/03/2014  . Chronic anticoagulation 11/03/2014  . Major depressive disorder 11/03/2014  . History of pulmonary embolus (PE) 11/03/2014  . MDD (major depressive disorder), recurrent severe, without psychosis ([place]) 03/05/2014  . Hot flashes 04/22/2011  . OVERACTIVE BLADDER 04/18/2008  . Osteoarthritis of both knees 04/18/2008  . Osteoarthritis involving multiple joints on both sides of body 09/14/2007  . Morbid obesity ([place]) 08/24/2006  . RESTLESS LEGS SYNDROME 08/24/2006   . HYPERTENSION, BENIGN SYSTEMIC 08/24/2006  . RHINITIS, ALLERGIC 08/24/2006  . GASTROESOPHAGEAL REFLUX, NO ESOPHAGITIS 08/24/2006   PCP:  Richarda Osmond, DO Pharmacy:   [facility] [place], [address] [place] [address] Phone: [phone] Fax: [phone]     Social Determinants of Health (SDOH) Interventions    Readmission Risk Interventions Readmission Risk Prevention Plan 02/07/2020 07/22/2019 03/08/2019  Transportation Screening Complete Complete Complete  Medication Review Press photographer) Complete Complete Complete  PCP or Specialist appointment within 3-5 days of discharge Complete Complete Complete  HRI or Home Care Consult Complete Complete Complete  SW Recovery Care/Counseling Consult Complete Complete Complete  Palliative Care Screening Complete Not Applicable Not Applicable  Skilled Nursing Facility Complete Not Applicable Not Applicable  Some recent data might be hidden

## 2020-06-15 NOTE — NC FL2 (Signed)
[place] MEDICAID FL2 LEVEL OF CARE SCREENING TOOL     IDENTIFICATION  Patient Name: [name] White Birthdate: [birth year] Sex: female Admission Date (Current Location): 06/13/2020  [facility] and [place] Number:  Herbalist and Address:  [facility] [place], [place] [address], [place], [place] [address]      Provider Number: [id number]  Attending Physician Name and Address:  Zenia Resides, MD  Relative Name and Phone Number:  Alta Corning [phone]    Current Level of Care: Hospital Recommended Level of Care: [place] Prior Approval Number:    Date Approved/Denied:   PASRR Number: [id number]  Discharge Plan: SNF    Current Diagnoses: Patient Active Problem List   Diagnosis Date Noted  . Severe comorbid illness   . Altered mental status   . Post-nasal drip 05/19/2020  . Frequent falls 05/06/2020  . Acute exacerbation of CHF (congestive heart failure) ([place]) 03/12/2020  . Community acquired pneumonia   . Acute respiratory failure with hypoxia ([place]) 02/06/2020  . SOB (shortness of breath) 11/24/2019  . Respiratory distress   . Respiratory failure, acute-on-chronic ([place]) 11/09/2019  . Seasonal allergies 10/03/2019  . Debility 08/02/2019  . Hypertension associated with diabetes ([place])   . Weight gain due to medication 06/05/2019  . Acute on chronic heart failure with preserved ejection fraction ([place]) 03/14/2019  . Acquired thrombophilia ([place]) due to A-Fib 03/07/2019  . Heart failure with preserved ejection fraction ([place]) 03/06/2019  . Obesity hypoventilation syndrome ([place]) 03/06/2019  . Lower GI bleed   . COPD exacerbation ([place]) 02/26/2019  . Urge incontinence 01/27/2019  . Shortness of breath 08/29/2018  . OSA (obstructive sleep apnea) 07/05/2018  . Respiratory failure ([place]) 07/05/2018  . Severe episode of recurrent major depressive disorder, without psychotic features ([place])   . MDD (major depressive disorder),  recurrent episode, severe ([place]) 09/07/2015  . Atrial flutter ([place]) 07/29/2015  . Asthma 11/12/2014  . History of hypertension 11/05/2014  . Paroxysmal atrial fibrillation ([place]) 11/03/2014  . Chronic anticoagulation 11/03/2014  . Major depressive disorder 11/03/2014  . History of pulmonary embolus (PE) 11/03/2014  . MDD (major depressive disorder), recurrent severe, without psychosis ([place]) 03/05/2014  . Hot flashes 04/22/2011  . OVERACTIVE BLADDER 04/18/2008  . Osteoarthritis of both knees 04/18/2008  . Osteoarthritis involving multiple joints on both sides of body 09/14/2007  . Morbid obesity ([place]) 08/24/2006  . RESTLESS LEGS SYNDROME 08/24/2006  . HYPERTENSION, BENIGN SYSTEMIC 08/24/2006  . RHINITIS, ALLERGIC 08/24/2006  . GASTROESOPHAGEAL REFLUX, NO ESOPHAGITIS 08/24/2006    Orientation RESPIRATION BLADDER Height & Weight     Self,Time,Situation,Place  Other (Comment) (bipap/triology) Continent,External catheter Weight: (!) 160.7 kg Height:  5\' 5"  (165.1 cm)  BEHAVIORAL SYMPTOMS/MOOD NEUROLOGICAL BOWEL NUTRITION STATUS      Continent Diet (see dc summary)  AMBULATORY STATUS COMMUNICATION OF NEEDS Skin   Extensive Assist Verbally Normal                       Personal Care Assistance Level of Assistance  Bathing,Feeding,Dressing Bathing Assistance: Limited assistance Feeding assistance: Limited assistance Dressing Assistance: Limited assistance     Functional Limitations Info  Sight,Hearing,Speech Sight Info: Adequate Hearing Info: Adequate Speech Info: Adequate    SPECIAL CARE FACTORS FREQUENCY  PT (By licensed PT),OT (By licensed OT)     PT Frequency: 5x/week OT Frequency: 5x//week            Contractures Contractures Info: Not present  Additional Factors Info  Code Status,Allergies Code Status Info: DNR Allergies Info: Caffeine, Hydralazine Hcl, Hydrocodone, Ciprofloxacin, Erythromycin, Lisinopril, Oxycodone, Sulfamethoxazole-trimethoprim, Tape            Current Medications (06/15/2020):  This is the current hospital active medication list Current Facility-Administered Medications  Medication Dose Route Frequency Provider Last Rate Last Admin  . acetaminophen (TYLENOL) tablet 650 mg  650 mg Oral Q4H PRN [name], MD   650 mg at 06/15/20 0848  . albuterol (VENTOLIN HFA) 108 (90 Base) MCG/ACT inhaler 2 puff  2 puff Inhalation Q4H PRN Anderson, Chelsey L, DO      . apixaban (ELIQUIS) tablet 5 mg  5 mg Oral BID [name], MD   5 mg at 06/15/20 0844  . atorvastatin (LIPITOR) tablet 40 mg  40 mg Oral QHS Anderson, Chelsey L, DO   40 mg at 06/14/20 2216  . cyclobenzaprine (FLEXERIL) tablet 5 mg  5 mg Oral TID PRN Ouida Sills, Chelsey L, DO   5 mg at 06/15/20 0848  . diclofenac sodium (VOLTAREN) 1 % transdermal gel 2 g  2 g Topical QID PRN Ouida Sills, Chelsey L, DO      . diltiazem (CARDIZEM CD) 24 hr capsule 120 mg  120 mg Oral Daily Anderson, Chelsey L, DO   120 mg at 06/15/20 0846  . flecainide (TAMBOCOR) tablet 100 mg  100 mg Oral BID Anderson, Chelsey L, DO   100 mg at 06/15/20 0847  . fluticasone (FLONASE) 50 MCG/ACT nasal spray 1 spray  1 spray Each Nare Daily Anderson, Chelsey L, DO   1 spray at 06/15/20 0851  . furosemide (LASIX) tablet 60 mg  60 mg Oral Daily Ezequiel Essex, MD   60 mg at 06/15/20 0844  . gabapentin (NEURONTIN) capsule 200 mg  200 mg Oral TID [name], MD   200 mg at 06/15/20 0845  . guaiFENesin (MUCINEX) 12 hr tablet 600 mg  600 mg Oral BID Lilland, Alana, DO   600 mg at 06/15/20 1140  . hydrOXYzine (ATARAX/VISTARIL) tablet 25 mg  25 mg Oral Daily PRN Anderson, Chelsey L, DO      . ipratropium (ATROVENT) 0.06 % nasal spray 2 spray  2 spray Each Nare Daily PRN Anderson, Chelsey L, DO   2 spray at 06/14/20 1203  . ipratropium (ATROVENT) 0.06 % nasal spray 2 spray  2 spray Each Nare QID Ezequiel Essex, MD   2 spray at 06/15/20 [phone]  . ipratropium-albuterol (DUONEB) 0.5-2.5 (3) MG/3ML nebulizer  solution 3 mL  3 mL Nebulization Q6H PRN Anderson, Chelsey L, DO   3 mL at 06/15/20 0748  . ketotifen (ZADITOR) 0.025 % ophthalmic solution 1 drop  1 drop Both Eyes Daily PRN Ezequiel Essex, MD      . loratadine (CLARITIN) tablet 10 mg  10 mg Oral Daily Anderson, Chelsey L, DO   10 mg at 06/15/20 0846  . melatonin tablet 3 mg  3 mg Oral QHS Anderson, Chelsey L, DO   3 mg at 06/14/20 2216  . metoprolol tartrate (LOPRESSOR) tablet 12.5 mg  12.5 mg Oral BID Anderson, Chelsey L, DO   12.5 mg at 06/15/20 0845  . multivitamin with minerals tablet 1 tablet  1 tablet Oral QPM Anderson, Chelsey L, DO   1 tablet at 06/14/20 1809  . pantoprazole (PROTONIX) EC tablet 40 mg  40 mg Oral Daily Anderson, Chelsey L, DO   40 mg at 06/15/20 0845  . polyethylene glycol (MIRALAX / GLYCOLAX) packet 17 g  17 g Oral Daily PRN Ouida Sills, Chelsey L, DO      . potassium chloride SA (KLOR-CON) CR tablet 20 mEq  20 mEq Oral Daily Anderson, Chelsey L, DO   20 mEq at 06/15/20 0848  . venlafaxine (EFFEXOR) tablet 37.5 mg  37.5 mg Oral Q breakfast Anderson, Chelsey L, DO   37.5 mg at 06/14/20 1058  . venlafaxine XR (EFFEXOR-XR) 24 hr capsule 150 mg  150 mg Oral Q breakfast Anderson, Chelsey L, DO   150 mg at 06/15/20 4492     Discharge Medications: Please see discharge summary for a list of discharge medications.  Relevant Imaging Results:  Relevant Lab Results:   Additional Information [phone]  Zenon Mayo, RN

## 2020-06-15 NOTE — Evaluation (Addendum)
Occupational Therapy Evaluation Patient Details Name: [name] White MRN: [id number] DOB: 1956-01-20 Today's Date: 06/15/2020    History of Present Illness 64 y.o. female with PMH significant for OHS OSA, HFpEF, HTN, paroxysmal A. fib on Eliquis, T2DM, MDD, GERD, osteoarthritis, morbid obesity, remote tobacco use. Pt was at SNF and became SOB and hypoxic. Pt with frequent readmissions for respiratory failure.   Clinical Impression   PTA, pt was at rehab and performing short distance mobility with therapy and staff assisting with ADLs. Pt currently requiring Min A for UB ADLs, Mod-Max A for LB ADLs, and Min Guard-Min A for functional mobility with RW. Pt presenting with decreased balance, strength, and activity tolerance. Pt requiring several seated rest breaks during toileting and bathing tasks. Pt would benefit from further acute OT to facilitate safe dc. Recommend dc to SNF for further OT to optimize safety, independence with ADLs, and return to PLOF.     Follow Up Recommendations  SNF    Equipment Recommendations  None recommended by OT    Recommendations for Other Services PT consult     Precautions / Restrictions Precautions Precautions: Fall Precaution Comments: monitor SpO2      Mobility Bed Mobility Overal bed mobility: Needs Assistance Bed Mobility: Supine to Sit     Supine to sit: Supervision;HOB elevated     General bed mobility comments: Supervision for safety    Transfers Overall transfer level: Needs assistance Equipment used: Rolling walker (2 wheeled) Transfers: Sit to/from [facility] Sit to Stand: Min guard Stand pivot transfers: Min assist       General transfer comment: Pt requiring Min Guard A for sit<>stand and increased effort. Pt needing Min A for hand held A during pivot to [facility]    Balance Overall balance assessment: Needs assistance Sitting-balance support: No upper extremity supported;Feet supported Sitting  balance-Leahy Scale: Good     Standing balance support: Bilateral upper extremity supported Standing balance-Leahy Scale: Poor Standing balance comment: reliant on UE support of RW                           ADL either performed or assessed with clinical judgement   ADL Overall ADL's : Needs assistance/impaired Eating/Feeding: Set up;Sitting   Grooming: Set up;Sitting   Upper Body Bathing: Minimal assistance;Sitting Upper Body Bathing Details (indicate cue type and reason): Min A for bathing back Lower Body Bathing: Moderate assistance;Sit to/from stand Lower Body Bathing Details (indicate cue type and reason): Mod A for cleaning posterior area. Pt washing legs and peri area Upper Body Dressing : Set up;Supervision/safety;Sitting   Lower Body Dressing: Maximal assistance;Sit to/from stand   Toilet Transfer: Minimal assistance;Min guard;Ambulation;Stand-pivot;BSC;RW Toilet Transfer Details (indicate cue type and reason): Initial stand pivot to [facility] with Min single hand held A. After toileting, pt performing side steps to recliner with MIn Guard A and RW. Toileting- Clothing Manipulation and Hygiene: Sit to/from stand;Sitting/lateral lean;Minimal assistance Toileting - Clothing Manipulation Details (indicate cue type and reason): Pt performing peri care after BM with lateral lean. Benefits from someone to make sure everywhere is clean     Functional mobility during ADLs: Min guard;Rolling walker General ADL Comments: Pt reports she has made great process at rehab and wants to continue working hard. Continue to present with decreased balance, strength, and activity [place] requiring several seated rest break.s Pt completing toileting and then bath while seated     Vision Baseline Vision/History: Wears glasses Wears Glasses: Reading  only Patient Visual Report: No change from baseline Vision Assessment?: No apparent visual deficits     Perception     Praxis       Pertinent Vitals/Pain Pain Assessment: No/denies pain     Hand Dominance Right   Extremity/Trunk Assessment Upper Extremity Assessment Upper Extremity Assessment: Overall WFL for tasks assessed   Lower Extremity Assessment Lower Extremity Assessment: Defer to PT evaluation   Cervical / Trunk Assessment Cervical / Trunk Assessment: Other exceptions Cervical / Trunk Exceptions: excess body habitus   Communication Communication Communication: No difficulties   Cognition Arousal/Alertness: Awake/alert Behavior During Therapy: WFL for tasks assessed/performed Overall Cognitive Status: Within Functional Limits for tasks assessed                                 General Comments: Very motivated. Also specific about her needs   General Comments  SpO2 >88% on 6L O2. HR 100-120 with effort.    Exercises     Shoulder Instructions      Home Living Family/patient expects to be discharged to:: Skilled nursing facility                                        Prior Functioning/Environment Level of Independence: Needs assistance  Gait / Transfers Assistance Needed: pt ambulates short household distances with use of walker ADL's / Homemaking Assistance Needed: niece completes all IADLs. Niece sets up pt for sponge bathing because pt unable to walk to bathroom recently. [facility] to [facility] for toileting at home. Assistance for bathing back, feet            OT Problem List: Decreased strength;Decreased activity tolerance;Impaired balance (sitting and/or standing);Cardiopulmonary status limiting activity      OT Treatment/Interventions: Self-care/ADL training;Therapeutic exercise;Energy conservation;DME and/or AE instruction;Therapeutic activities;Patient/family education;Balance training    OT Goals(Current goals can be found in the care plan section) Acute Rehab OT Goals Patient Stated Goal: I am going to walk again OT Goal Formulation: With patient Time  For Goal Achievement: 06/29/20 Potential to Achieve Goals: Good  OT Frequency: Min 2X/week   Barriers to D/C:            Co-evaluation              AM-PAC OT "6 Clicks" Daily Activity     Outcome Measure Help from another person eating meals?: None Help from another person taking care of personal grooming?: A Little Help from another person toileting, which includes using toliet, bedpan, or urinal?: A Little Help from another person bathing (including washing, rinsing, drying)?: A Lot Help from another person to put on and taking off regular upper body clothing?: A Little Help from another person to put on and taking off regular lower body clothing?: A Lot 6 Click Score: 17   End of Session Equipment Utilized During Treatment: Oxygen;Rolling walker Nurse Communication: Mobility status;Other (comment) (Pt finishing bath)  Activity Tolerance: Patient tolerated treatment well Patient left: in chair;with call bell/phone within reach  OT Visit Diagnosis: Unsteadiness on feet (R26.81);Other abnormalities of gait and mobility (R26.89);Muscle weakness (generalized) (M62.81);Other (comment)                Time: 0321-2248 OT Time Calculation (min): 42 min Charges:  OT General Charges $OT Visit: 1 Visit OT Evaluation $OT Eval Moderate Complexity: 1 Mod OT Treatments $  Self Care/Home Management : 23-37 mins  [place], OTR/L Acute Rehab Pager: [phone] Office: [place] 06/15/2020, 12:40 PM

## 2020-06-15 NOTE — Progress Notes (Signed)
Family Medicine Teaching Service Daily Progress Note Intern Pager: [phone]  Patient name: [name] White Medical record number: [id number] Date of birth: 1955/07/24 Age: 64 y.o. Gender: female  Primary Care Provider: Richarda Osmond, DO Consultants: None Code Status: Full  Pt Overview and Major Events to Date:  12/18 admitted  Assessment and Plan: [name] White a 64 y.o.femalewithPMH significant forOHS OSA, HFpEF, HTN, paroxysmal A. fib on Eliquis, T2DM,MDD, GERD, osteoarthritis, morbid obesity,remote tobacco use with 20-pack-year cigarette history.  Acute on Chronic Hypoxic/Hypercapneic Respiratory failure  OHS Patient reports doing well overnight after BiPAP while sleeping.  Doing well on 6 L nasal cannula this morning.  Complains of cough, says that the [employer] do not work for her.  Patient requires SNF placement in a facility will have proper NIV or allows her trilogy.  Patient is medically stable and ready for discharge to SNF. -BiPAP(trilogy)nightly and PRN - continue home breathing treatments -PT/OT eval and treat - TOCreferral  Deconditioning PT recommends SNF with 24-hour supervision assistance. -PT/OT, appreciate recs -TOC, appreciate placement help  HFpEFHypertension Chronic, stable.   BP hypertensive on admission, likely from missing medications prior to admission.  BP last 24 hours 86-137/53-104, last 134/90 with pulse 115.  Pulses 76-115. Home medications includeatorvastatin, Cardizem, metoprolol. -Continuehome meds  Paroxysmal A. fib on Eliquis Pulses last 24 hours 76-115.  Rate controlled typically. HR elevated on admission likely related to acute respiratory distress as well as missing rate control medications on presentation. -Continuous telemetry 24 to 48 hours -Continuehome medications  Type 2 diabetes mellitus Glucose 136 on admission CMP.  No a.m. glucoses this morning.  Added on order for daily  glucose. -Follow-up daily glucose  Thrombocytosis, chronic, stable Platelets 563 on admission, consistent with her normal.  NormocyticAnemia-Chronic, stable Hemoglobin 10.9 on admission, increased to 12.9.  This is consistent with her baseline.  Will not require daily labs for monitoring.  Rhinorrhea-chronic, improved Patient with history of chronic nasal drainage. Was recently started onAtroventnasal spray which she reports has stopped her nasal drainage -Atrovent nasal spray PRN  MDD: Chronic, stable Home meds includevenlafaxine total dose 187.5 mg daily. -Continue home med  GERD: Chronic, stable Home meds includeomeprazole 20 mg daily. -Protonix formulary  Osteoarthritis: Chronic, stable Home meds includegabapentin 200 mg 3 times daily. -Continue home med  FEN/GI:N.p.o. while on BiPAP, otherwise heart healthy diet Prophylaxis:On Eliquis   Status is: Inpatient  Remains inpatient appropriate because:Awaiting placement   Dispo: The patient is from: SNF              Anticipated d/c is to: SNF              Anticipated d/c date is: 1 day              Patient currently is medically stable to d/c.    Subjective:  Patient reports doing well overnight after BiPAP while sleeping.  Doing well on 6 L nasal cannula this morning.  Complains of cough, says that the [employer] do not work for her.   Objective: Temp:  [98 F (36.7 C)-99.1 F (37.3 C)] 98 F (36.7 C) (12/20 0400) Pulse Rate:  [76-115] 90 (12/20 0845) Resp:  [17-24] 22 (12/20 0748) BP: (86-134)/(53-104) 125/92 (12/20 0845) SpO2:  [97 %-100 %] 97 % (12/20 0748) FiO2 (%):  [98 %] 98 % (12/19 1418) Weight:  [160.7 kg] 160.7 kg (12/20 0055)  Physical Exam: General: Awake, alert, oriented, no acute distress Respiratory: Normal respiratory effort, no respiratory distress,  on 6 L via [place] (home level O2) Abdomen: Nondistended, obese abdomen Extremities: Moving all  spontaneously  Laboratory: Recent Labs  Lab 06/13/20 1822 06/13/20 1847  WBC 9.1  --   HGB 10.9* 12.9  HCT 37.6 38.0  PLT 563*  --    Recent Labs  Lab 06/13/20 1822 06/13/20 1847  NA 141 142  K 4.0 4.2  CL 93*  --   CO2 37*  --   BUN 7*  --   CREATININE 0.53  --   CALCIUM 9.4  --   PROT 7.9  --   BILITOT 0.5  --   ALKPHOS 99  --   ALT 21  --   AST 22  --   GLUCOSE 136*  --     Imaging/Diagnostic Tests: No results found.  Ezequiel Essex, MD 06/15/2020, 12:53 PM PGY-1, [place] Intern pager: [phone], text pages welcome

## 2020-06-16 ENCOUNTER — Ambulatory Visit: Payer: Self-pay | Admitting: Licensed Clinical Social Worker

## 2020-06-16 LAB — GLUCOSE, CAPILLARY
Glucose-Capillary: 106 mg/dL — ABNORMAL HIGH (ref 70–99)
Glucose-Capillary: 100 mg/dL — ABNORMAL HIGH (ref 70–99)

## 2020-06-16 NOTE — Plan of Care (Signed)
  Problem: Activity: Goal: Capacity to carry out activities will improve Outcome: Progressing   Problem: Cardiac: Goal: Ability to achieve and maintain adequate cardiopulmonary perfusion will improve Outcome: Progressing   Problem: Clinical Measurements: Goal: Respiratory complications will improve Outcome: Progressing

## 2020-06-16 NOTE — Progress Notes (Signed)
Physical Therapy Treatment Patient Details Name: [name] White MRN: [id number] DOB: 1956-03-12 Today's Date: 06/16/2020    History of Present Illness 64 y.o. female with PMH significant for OHS OSA, HFpEF, HTN, paroxysmal A. fib on Eliquis, T2DM, MDD, GERD, osteoarthritis, morbid obesity, remote tobacco use. Pt was at SNF and became SOB and hypoxic. Pt with frequent readmissions for respiratory failure.    PT Comments    Patient progressing well towards PT goals. Pt had just finished a full bath and transfer from [facility] to chair prior to PT arrival so was short of breath initially. Motivated to work with therapy. Tolerated gait training short distances with Min guard assist and use of RW for support. 2/4 DOE noted with Sp02 remaining >94% on 6L/min 02 [place]. HR up to 126 bpm. Requires longer seated rest break due to SOB and fatigue but able to recover well. Will continue to follow and progress.     Follow Up Recommendations  SNF;Supervision/Assistance - 24 hour     Equipment Recommendations  None recommended by PT    Recommendations for Other Services       Precautions / Restrictions Precautions Precautions: Fall Precaution Comments: monitor SpO2 Restrictions Weight Bearing Restrictions: No    Mobility  Bed Mobility               General bed mobility comments: up in chair upon PT arrival.  Transfers Overall transfer level: Needs assistance Equipment used: Rolling walker (2 wheeled) Transfers: Sit to/from Stand Sit to Stand: Supervision         General transfer comment: Supervision for safety. Stood from chair x2, slow to rise, no assist needed.  Ambulation/Gait Ambulation/Gait assistance: Min guard Gait Distance (Feet): 26 Feet (x2 bouts) Assistive device: Rolling walker (2 wheeled) Gait Pattern/deviations: Step-to pattern;Trunk flexed Gait velocity: reduced   General Gait Details: pt with short step-to gait, widened BOS and reduced gait speed, 2/4 DOE. Sp02  remained >93% on 6L/min 02 [place]. Requires long seated rest breaks between walking bouts.   Stairs             Wheelchair Mobility    Modified Rankin (Stroke Patients Only)       Balance Overall balance assessment: Needs assistance Sitting-balance support: Feet supported;No upper extremity supported Sitting balance-Leahy Scale: Good Sitting balance - Comments: Gave herself a bath prior to arrival in chair, able to sit up unsupported for short periods but requires back rest esp when SOB.   Standing balance support: During functional activity Standing balance-Leahy Scale: Poor Standing balance comment: reliant on UE support of RW                            Cognition Arousal/Alertness: Awake/alert Behavior During Therapy: WFL for tasks assessed/performed Overall Cognitive Status: Within Functional Limits for tasks assessed                                 General Comments: Very motivated.      Exercises      General Comments General comments (skin integrity, edema, etc.): SP02 remained >935 on 6L/min 02 [place]. HR up to 126 bpm with activity.      Pertinent Vitals/Pain Pain Assessment: No/denies pain    Home Living                      Prior Function  PT Goals (current goals can now be found in the care plan section) Progress towards PT goals: Progressing toward goals    Frequency    Min 3X/week      PT Plan Current plan remains appropriate    Co-evaluation              AM-PAC PT "6 Clicks" Mobility   Outcome Measure  Help needed turning from your back to your side while in a flat bed without using bedrails?: A Little Help needed moving from lying on your back to sitting on the side of a flat bed without using bedrails?: A Little Help needed moving to and from a bed to a chair (including a wheelchair)?: A Little Help needed standing up from a chair using your arms (e.g., wheelchair or bedside chair)?: A  Little Help needed to walk in hospital room?: A Little Help needed climbing 3-5 steps with a railing? : A Lot 6 Click Score: 17    End of Session Equipment Utilized During Treatment: Oxygen Activity Tolerance: Patient tolerated treatment well;Patient limited by fatigue Patient left: in chair;with call bell/phone within reach Nurse Communication: Mobility status PT Visit Diagnosis: Other abnormalities of gait and mobility (R26.89);Muscle weakness (generalized) (M62.81)     Time: 3976-7341 PT Time Calculation (min) (ACUTE ONLY): 26 min  Charges:  $Gait Training: 8-22 mins $Therapeutic Activity: 8-22 mins                     Marisa Severin, PT, DPT Acute Rehabilitation Services Pager [phone] Office [place] 06/16/2020, 12:05 PM

## 2020-06-16 NOTE — Chronic Care Management (AMB) (Signed)
   Social Work  Care Management Collaboration 06/16/2020 Name: [name] MRN: [id number] DOB: Aug 05, 1955 [name] White is a 64 y.o. year old female who sees Richarda Osmond, DO for primary care. LCSW was consulted by in-patient social worker [employer], [place]. Patient is in the hospital and needs new SNF placement.  Per Bethann Berkshire, LCSW patient informed him that this LCSW was working on placement for her.   Recommendation: After collaboration it is determined that patient may benefit from [facility], LCSW starting a new SNF placement from the hospital as this LCSW is unable to do the placement.    Intervention: Patient was not interviewed or contacted during this encounter.   Review of patient status, including review of consultants reports, relevant laboratory and other test results, and collaboration with appropriate care team members and the patient's provider was performed as part of comprehensive patient evaluation and provision of chronic care management services.    Plan: Patient will be placed in SNF from hospital.  CCM team will provide ongoing support after patient is discharged from SNF.   [name], [place] / [place]   [phone] 8:52 AM

## 2020-06-16 NOTE — TOC Progression Note (Addendum)
Transition of Care [facility]) - Progression Note    Patient Details  Name: [name] White MRN: [id number] Date of Birth: 05/31/1956  Transition of Care [facility]) CM/SW [place], [place] Phone Number: 06/16/2020, 8:40 AM  Clinical Narrative:     CSW received return call from PCP [facility] Olivia Mackie who explained her coworker [name] was working on placement. CSW called LCSW [name] at [phone] who explained SNF placement was attempted from home in November prior to pt's initial hospital stay. Neoma Laming stated no facilities were able to take pt at that time.   Expected Discharge Plan: Skilled Nursing Facility Barriers to Discharge: Continued Medical Work up,No SNF bed  Expected Discharge Plan and Services Expected Discharge Plan: [place]                                               Social Determinants of Health (SDOH) Interventions    Readmission Risk Interventions Readmission Risk Prevention Plan 02/07/2020 07/22/2019 03/08/2019  Transportation Screening Complete Complete Complete  Medication Review [employer]) Complete Complete Complete  PCP or Specialist appointment within 3-5 days of discharge Complete Complete Complete  HRI or Home Care Consult Complete Complete Complete  SW Recovery Care/Counseling Consult Complete Complete Complete  Palliative Care Screening Complete Not Applicable Not [place] Complete Not Applicable Not Applicable  Some recent data might be hidden

## 2020-06-16 NOTE — Plan of Care (Signed)
?  Problem: Activity: ?Goal: Capacity to carry out activities will improve ?Outcome: Progressing ?  ?

## 2020-06-16 NOTE — Progress Notes (Addendum)
Family Medicine Teaching Service Daily Progress Note Intern Pager: [phone]  Patient name: [name] White Medical record number: [id number] Date of birth: 28-Nov-1955 Age: 64 y.o. Gender: female  Primary Care Provider: Richarda Osmond, DO Consultants: None Code Status: Full  Pt Overview and Major Events to Date: 12/18 admitted  Assessment and Plan: [name] White a 64 y.o.femalewithPMH significant forOHS OSA, HFpEF, HTN, paroxysmal A. fib on Eliquis, T2DM,MDD, GERD, osteoarthritis, morbid obesity,remote tobacco use with 20-pack-year cigarette history.  Acute on Chronic Hypoxic/Hypercapneic Respiratory failure  OHS Patient on bedside commode this morning when I attempted to visit.  Per nurse, patient doing well with no breathing issues, speaking well.  Will return later as afternoon for physical exam. Patient requires SNF placement in a facility will have proper NIV or allows her trilogy.  Patient is medically stable and ready for discharge to SNF. -BiPAP(trilogy)nightly and anytime while sleeping - continue home breathing treatments -PT/OT eval and treat - TOCreferral  Deconditioning PT recommends SNF with 24-hour supervision assistance.  Patient reports doing quite well with PT and OT at previous SNF.  She is determined to complete for rehab as soon as possible. -TOC, appreciate placement help  HFpEFHypertension Chronic, stable. BP last 24 hours 105-125/69-92, last 107/71, pulse 69.  Pulses 69-96. Home medications includeatorvastatin, Cardizem, metoprolol. -Continuehome meds  Paroxysmal A. fib on Eliquis Pulses 69-96. Rate controlled typically.  -Continuous telemetry 24 to 48 hours -Continuehome medications  Type 2 diabetes mellitus A.m. glucose 100 -Follow-up daily glucose  Thrombocytosis, chronic, stable Platelets 563 on admission, consistent with her normal.  No daily CBC ordered given medically stable and ready for SNF.    NormocyticAnemia-Chronic, stable Hemoglobin 10.9 on admission, increased to 12.9.  No daily CBC ordered given medically stable and ready for SNF.   Rhinorrhea-chronic, improved Patient with history of chronic nasal drainage. Was recently started onAtroventnasal spray which she reports has stopped her nasal drainage -Atroventnasal spray PRN  MDD: Chronic, stable Home meds includevenlafaxine total dose 187.5 mg daily. -Continue home med  GERD: Chronic, stable Home meds includeomeprazole 20 mg daily. -Protonix formulary  Osteoarthritis: Chronic, stable Home meds includegabapentin 200 mg 3 times daily. -Continue home med  FEN/GI:N.p.o. while on BiPAP, otherwise heart healthy diet Prophylaxis:On Eliquis   Status is: Inpatient  Remains inpatient appropriate because:awaiting SNF placement   Dispo: The patient is from: SNF              Anticipated d/c is to: SNF              Anticipated d/c date is: 3 days              Patient currently is medically stable to d/c.   Subjective:  Patient on bedside commode this morning when I attempted to visit.  Per nurse, patient doing well with no breathing issues, speaking well.  Will return later as afternoon for physical exam.   Objective: Temp:  [97.6 F (36.4 C)-98.4 F (36.9 C)] 97.6 F (36.4 C) (12/21 0525) Pulse Rate:  [69-105] 105 (12/21 0805) Resp:  [17-21] 18 (12/21 0805) BP: (105-111)/(69-76) 107/71 (12/21 0525) SpO2:  [96 %-99 %] 96 % (12/21 0805) Weight:  [161.6 kg] 161.6 kg (12/21 0525)  Physical Exam: Deferred to patient on bedside commode.  Will return later.  Laboratory: Recent Labs  Lab 06/13/20 1822 06/13/20 1847  WBC 9.1  --   HGB 10.9* 12.9  HCT 37.6 38.0  PLT 563*  --    Recent Labs  Lab 06/13/20 1822 06/13/20 1847  NA 141 142  K 4.0 4.2  CL 93*  --   CO2 37*  --   BUN 7*  --   CREATININE 0.53  --   CALCIUM 9.4  --   PROT 7.9  --   BILITOT 0.5  --   ALKPHOS 99  --   ALT  21  --   AST 22  --   GLUCOSE 136*  --     Imaging/Diagnostic Tests: None new last 24 hours.   Patient is medically stable and ready for discharge to SNF.  Ezequiel [place], MD 06/16/2020, 9:57 AM PGY-1, [place] Intern pager: [phone], text pages welcome

## 2020-06-16 NOTE — TOC Progression Note (Signed)
Transition of Care [facility]) - Progression Note    Patient Details  Name: [name] White MRN: [id number] Date of Birth: Oct 30, 1955  Transition of Care [facility]) CM/SW [place], [place] Phone Number: 06/16/2020, 5:06 PM  Clinical Narrative:     CSW is discussing with [facility] whether or not they can accept pt with Trilogy on [place] settings. They may be able to accept if Trilogy only needed during sleep.   CSW met with pt and discussed option. Pt stated she needs trilogy after physical therapy. Pt states she would do fine with regular BIPAP. States that it was [facility]'s bipap that was not effective for her.   CSW discussed with Sport and exercise psychologist at [place]. CSW faxed additional O2 documentation to [facility] at [phone]  Expected Discharge Plan: [place] Barriers to Discharge: Continued Medical Work up,No SNF bed  Expected Discharge Plan and Services Expected Discharge Plan: [place]                                               Social Determinants of Health (SDOH) Interventions    Readmission Risk Interventions Readmission Risk Prevention Plan 02/07/2020 07/22/2019 03/08/2019  Transportation Screening Complete Complete Complete  Medication Review Press photographer) Complete Complete Complete  PCP or Specialist appointment within 3-5 days of discharge Complete Complete Complete  HRI or Home Care Consult Complete Complete Complete  SW Recovery Care/Counseling Consult Complete Complete Complete  Palliative Care Screening Complete Not Applicable Not [place] Complete Not Applicable Not Applicable  Some recent data might be hidden

## 2020-06-17 ENCOUNTER — Telehealth: Payer: Medicare HMO

## 2020-06-17 NOTE — Care Management Important Message (Signed)
Important Message  Patient Details  Name: [name] White MRN: [id number] Date of Birth: 1955-09-08   Medicare Important Message Given:  Yes     Shelda Altes 06/17/2020, 10:02 AM

## 2020-06-17 NOTE — Progress Notes (Signed)
OT Cancellation Note  Patient Details Name: [name] White MRN: [id number] DOB: [birth year]   Cancelled Treatment:    Reason Eval/Treat Not Completed: Pain limiting ability to participate;Other (comment);Fatigue/lethargy limiting ability to participate Pt reports pain from repetitive cough, declining OT session. Will check back as time allows for OT session.  Lanier Clam., COTA/L Acute Rehabilitation Services [phone] [place] 06/17/2020, 3:06 PM

## 2020-06-17 NOTE — Progress Notes (Signed)
Family Medicine Teaching Service Daily Progress Note Intern Pager: [phone]  Patient name: [name] White Medical record number: [id number] Date of birth: [birth year] Age: 64 y.o. Gender: female  Primary Care Provider: Richarda Osmond, DO Consultants: none Code Status: full  Pt Overview and Major Events to Date:  12/18 - admitted  Assessment and Plan: [name] White a 64 y.o.femalewithPMH significant forOHS OSA, HFpEF, HTN, paroxysmal A. fib on Eliquis, T2DM,MDD, GERD, osteoarthritis, morbid obesity,remote tobacco use with 20-pack-year cigarette history.  Acute on Chronic Hypoxic/Hypercapneic Respiratory failure OHS Pt back to baseline O2 requirement.  Using her trilogy machine at night.  On 5L [place].  Patient requires SNF placement in a facility will have proper NIV or allows her trilogy.Patient is medically stable and ready for discharge to SNF. -BiPAP(trilogy)nightly and anytime while sleeping - continue home breathing treatments -PT/OT eval and treat - TOCreferral  Deconditioning PT recommends SNF with 24-hour supervision assistance.  Patient reports doing quite well with PT and OT at previous SNF.  She is determined to complete for rehab as soon as possible. -TOC, appreciate placement help  HFpEFHypertension Chronic, stable. normotensive in past 24 hours Home medications includeatorvastatin, Cardizem, metoprolol. -Continuehome meds  Paroxysmal A. fib on Eliquis Pulses 69-96.Rate controlled on home meds: flecainide, diltiazem, metoprolol.  -Continuous telemetry 24 to 48 hours -Continuehome medications - continue eliquis   Type 2 diabetes mellitus Diet controlled.   -Follow-up daily glucose  Thrombocytosis, chronic, stable Platelets 563 on admission,consistent with her normal.  No daily CBC ordered given medically stable and ready for SNF.   NormocyticAnemia-Chronic, stable Hemoglobin 10.9 on admission,increased to 12.9. No  daily CBC ordered given medically stable and ready for SNF.   Rhinorrhea-chronic, improved Patient with history of chronic nasal drainage. Was recently started onAtroventnasal spray which she reports has stopped her nasal drainage -Atroventnasal spray PRN  MDD: Chronic, stable Home meds includevenlafaxine total dose 187.5 mg daily. -Continue home med  GERD: Chronic, stable Home meds includeomeprazole 20 mg daily. -Protonix formulary  Osteoarthritis: Chronic, stable Home meds includegabapentin 200 mg 3 times daily. -Continue home med  FEN/GI:N.p.o. while on BiPAP, otherwise heart healthy diet Prophylaxis:On Eliquis   Disposition: SNF  Subjective:  Patient states she feels good today.  Her only complaint is a cough for the past 2-3 days.  She states the atrovent has helped her rhinorrhea considerably.  She states she spoke with social work and may be able to be accepted to [facility].   Objective: Temp:  [97.9 F (36.6 C)-98.8 F (37.1 C)] 98.8 F (37.1 C) (12/22 0750) Pulse Rate:  [74-109] 94 (12/22 0750) Resp:  [17-30] 20 (12/22 0750) BP: (112-130)/(68-88) 130/68 (12/22 0750) SpO2:  [91 %-100 %] 99 % (12/22 0750) FiO2 (%):  [40 %] 40 % (12/22 0145) Weight:  [161.9 kg] 161.9 kg (12/22 0004) Physical Exam: General: alert, oriented.  No acute distress.   Cardiovascular: RRR.  Distant heart sounds.  Respiratory: decreased inhalation.  Inspiratory crackles bilaterally Abdomen: obese pannus.    Laboratory: Recent Labs  Lab 06/13/20 1822 06/13/20 1847  WBC 9.1  --   HGB 10.9* 12.9  HCT 37.6 38.0  PLT 563*  --    Recent Labs  Lab 06/13/20 1822 06/13/20 1847  NA 141 142  K 4.0 4.2  CL 93*  --   CO2 37*  --   BUN 7*  --   CREATININE 0.53  --   CALCIUM 9.4  --   PROT 7.9  --  BILITOT 0.5  --   ALKPHOS 99  --   ALT 21  --   AST 22  --   GLUCOSE 136*  --       Sandre Kitty, MD 06/17/2020, 8:19 AM PGY-3, [facility] FPTS Intern pager: [phone], text pages welcome

## 2020-06-17 NOTE — TOC Progression Note (Addendum)
Transition of Care [facility]) - Progression Note    Patient Details  Name: [name] White MRN: [id number] Date of Birth: 1956/02/04  Transition of Care [facility]) CM/SW [place], [place] Phone Number: 06/17/2020, 2:46 PM  Clinical Narrative:     CSW spoke with Rolla Plate at [place]. CSW is notified that [place] clinical team still reviewing   1530: [facility] unable to accept due to pt needing bipap/trilogy more than at sleep.  CSW updated pt. She is willing to seek snf outside of [place] area. CSW expanded snf search.    CSW contacted [facility] admissions who stated they are in network with pt insurance and will review clinicals.   1624: Pt called CSW and provided names of SNF's she was interested in though only 1 was a SNF. List included:  [facility] (SNF in [facility]) [facility] (aging and Physiological scientist) [facility] ([place]) "Inpatient [facility]" (drug/alcohol rehab center)  CSW will send referral to [facility] in [employer].   Expected Discharge Plan: [place] Barriers to Discharge: Continued Medical Work up,No SNF bed  Expected Discharge Plan and Services Expected Discharge Plan: [place]                                               Social Determinants of Health (SDOH) Interventions    Readmission Risk Interventions Readmission Risk Prevention Plan 02/07/2020 07/22/2019 03/08/2019  Transportation Screening Complete Complete Complete  Medication Review Press photographer) Complete Complete Complete  PCP or Specialist appointment within 3-5 days of discharge Complete Complete Complete  HRI or Home Care Consult Complete Complete Complete  SW Recovery Care/Counseling Consult Complete Complete Complete  Palliative Care Screening Complete Not Applicable Not [place] Complete Not Applicable Not Applicable  Some recent data might be hidden

## 2020-06-17 NOTE — Discharge Summary (Addendum)
[facility] Discharge Summary  Patient name: [name] White Medical record number: [id number] Date of birth: 12-09-1955 Age: 64 y.o. Gender: female Date of Admission: 06/13/2020  Date of Discharge: 06/26/2020  Admitting Physician: Richarda Osmond, DO  Primary Care Provider: Richarda Osmond, DO Consultants: none  Indication for Hospitalization: Acute on chronic hypoxic hypercapnic respiratory failure  Discharge Diagnoses/Problem List:  Principal Problem:   Difficulty using biphasic positive airway pressure (BiPAP) machine Active Problems:   SOB (shortness of breath)   Severe comorbid illness   Normocytic anemia   Deconditioning   HTN   HFpEF   pAF   T2DM   Normocytic anemia   MDD   GERD   OA  Disposition: SNF  Discharge Condition: good  Discharge Exam:  General: Obese female, NAD, laying in bed with BiPAP Cardiovascular: Irregularly irregular, no murmurs Respiratory: CTAB, BiPAP machine in place, no respiratory distress Abdomen: soft, non-tender, non-distended Extremities: WWP, trace edema Derm: right buttock lesion stable from previous   Brief Hospital Course:  [name] White is a 64 yr old female who was admitted for shortness of breath and hypoxia due to malfunction of NIV at SNF.  Placement Pt was admitted primarily for placement issues. She was discharged to SNF on BiPAP with machine delivery to the facility prior to discharge.   Deconditioning Pt had on going PT/OT in hospital. She displayed motivation and has enjoyed rehab.  Acute on Chronic Hypoxic/Hypercapneic Respiratory failure Continued on her home medications. Continued use of BiPAP throughout stay. No changes made in hospital.  Wound on Right Buttock  Patient was noted to have scant bleeding from wound, non-tender with induration and no purulent drainage or signs of infection (normal WBC, no fever). Wound care was consulted for recommendation regarding dressing  the area.  On day of discharge, patient reported this wound is not painful with minimal drainage.  Normocytic Anemia (Iron Deficiency Anemia) Hemoglobin this admission 12.9 > 9.6 > 9.5 > 9.5.  Ferritin and iron panel demonstrated likely iron deficiency (ferritin 17, iron sat 11)  Baseline Hgb around 9-11 since 2019. Patient was started on p.o. iron 325 mg every other day. Counseled on use of stool softeners while taking p.o. iron.  No transfusion needed during admit.    PCP recommendations: 1. Please place GI referral. Will need colonoscopy+/- EGD outpatient for further evaluation of iron-deficiency anemia and Fhx colon cancer.  2. Recommend outpatient CBC for follow-up of iron deficiency anemia. 3. Continuation of BiPAP machine at SNF - BiPAP settings: IPAP Max 30 min 8, EPAP 6 (patient must continue BiPAP or she will go into respiratory failure) 4. Recommend follow-up monitoring of gluteal wound drainage and continue application of bacitracin if worsening.    Significant Procedures: none  Significant Labs and Imaging:  Recent Labs  Lab 06/21/20 0404 06/22/20 0333 06/23/20 0341  WBC 8.2 8.2 7.7  HGB 9.6* 9.5* 9.5*  HCT 35.1* 34.3* 33.5*  PLT 400 386 419*   Recent Labs  Lab 06/21/20 0404  NA 140  K 3.8  CL 91*  CO2 40*  GLUCOSE 127*  BUN 9  CREATININE 0.47  CALCIUM 8.8*      Results/Tests Pending at Time of Discharge: none  Discharge Medications:  Allergies as of 06/26/2020      Reactions   Caffeine Other (See Comments)   Migraine   Hydralazine Hcl Other (See Comments)   AKI leading to rhabdo and electrolyte abnormalities   Hydrocodone Nausea And Vomiting  Headache, also   Ciprofloxacin Hives, Rash   Erythromycin Hives, Rash   Lisinopril Cough   Oxycodone Nausea And Vomiting, Other (See Comments)   Headaches, also   Sulfamethoxazole-trimethoprim Rash   Tape Rash, Other (See Comments)      Medication List    STOP taking these medications    cyclobenzaprine 5 MG tablet Commonly known as: FLEXERIL     TAKE these medications   acetaminophen 500 MG tablet Commonly known as: TYLENOL Take 500 mg by mouth in the morning, at noon, and at bedtime. What changed: Another medication with the same name was changed. Make sure you understand how and when to take each.   acetaminophen 325 MG tablet Commonly known as: TYLENOL Take 2 tablets (650 mg total) by mouth every 6 (six) hours as needed for mild pain. What changed: reasons to take this   albuterol 108 (90 Base) MCG/ACT inhaler Commonly known as: VENTOLIN HFA Inhale 2 puffs into the lungs every 4 (four) hours as needed for wheezing or shortness of breath.   apixaban 5 MG Tabs tablet Commonly known as: ELIQUIS Take 1 tablet (5 mg total) by mouth 2 (two) times daily.   atorvastatin 40 MG tablet Commonly known as: LIPITOR Take 1 tablet (40 mg total) by mouth daily. What changed: when to take this   azelastine 0.05 % ophthalmic solution Commonly known as: OPTIVAR INSTILL 1 DROP IN BOTH EYES TWICE DAILY AS NEEDED FOR ITCHY EYES   bacitracin ointment Apply topically 2 (two) times daily.   cetirizine 10 MG tablet Commonly known as: ZYRTEC TAKE 1 TABLET(10 MG) BY MOUTH DAILY What changed: See the new instructions.   diclofenac sodium 1 % Gel Commonly known as: VOLTAREN APPLY 2 GRAMS EXTERNALLY TO THE AFFECTED AREA FOUR TIMES DAILY What changed: See the new instructions.   diltiazem 120 MG 24 hr capsule Commonly known as: CARDIZEM CD TAKE 1 CAPSULE BY MOUTH ONCE DAILY   ferrous sulfate 325 (65 FE) MG tablet Take 1 tablet (325 mg total) by mouth every other day. Start taking on: June 27, 2020   flecainide 100 MG tablet Commonly known as: TAMBOCOR TAKE 1 TABLET BY MOUTH TWICE DAILY   fluticasone 50 MCG/ACT nasal spray Commonly known as: FLONASE Place 1 spray into both nostrils daily.   furosemide 40 MG tablet Commonly known as: LASIX Take 1.5 tablets (60 mg  total) by mouth daily.   gabapentin 100 MG capsule Commonly known as: NEURONTIN Take 2 capsules (200 mg total) by mouth 3 (three) times daily.   guaiFENesin 100 MG/5ML Soln Commonly known as: ROBITUSSIN Take 5 mLs (100 mg total) by mouth every 12 (twelve) hours as needed for cough or to loosen phlegm.   hydrocortisone cream 1 % Apply topically 2 (two) times daily.   hydrOXYzine 25 MG capsule Commonly known as: VISTARIL TAKE 1 CAPSULE(25 MG) BY MOUTH DAILY AS NEEDED FOR ANXIETY What changed: See the new instructions.   ipratropium 0.06 % nasal spray Commonly known as: ATROVENT Place 2 sprays into both nostrils 4 (four) times daily.   ipratropium-albuterol 0.5-2.5 (3) MG/3ML Soln Commonly known as: DUONEB Take 3 mLs by nebulization every 6 (six) hours as needed (shortness of breath, wheezing).   melatonin 3 MG Tabs tablet Take 1 tablet (3 mg total) by mouth at bedtime as needed (sleep). What changed: when to take this   metoprolol tartrate 25 MG tablet Commonly known as: LOPRESSOR TAKE 1/2 TABLET(12.5 MG) BY MOUTH TWICE DAILY What changed: See the  new instructions.   multivitamin tablet Take 1 tablet by mouth every evening.   omeprazole 20 MG capsule Commonly known as: PRILOSEC Take 1 capsule (20 mg total) by mouth daily.   OXYGEN Inhale 6-7 L/min into the lungs continuous.   polyethylene glycol 17 g packet Commonly known as: MIRALAX / GLYCOLAX Take 17 g by mouth daily as needed for mild constipation or moderate constipation.   potassium chloride 10 MEQ tablet Commonly known as: KLOR-CON TAKE 2 TABLETS(20 MEQ) BY MOUTH DAILY What changed: See the new instructions.   PRESCRIPTION MEDICATION See admin instructions. Trilogy [employer]-  At bedtime and during any time of rest   venlafaxine XR 150 MG 24 hr capsule Commonly known as: EFFEXOR-XR Take 1 capsule (150 mg total) by mouth daily with breakfast. Take with a 37.5 mg tablet every morning What  changed:   when to take this  additional instructions   venlafaxine 37.5 MG tablet Commonly known as: EFFEXOR Take 1 tablet (37.5 mg total) by mouth daily with breakfast. Take with a 150mg  ER capsule every morning for a total of 187.5mg  What changed: when to take this       Discharge Instructions: Please refer to Patient Instructions section of EMR for full details.  Patient was counseled important signs and symptoms that should prompt return to medical care, changes in medications, dietary instructions, activity restrictions, and follow up appointments.   Follow-Up Appointments:   Zola Button, MD 06/26/2020, 10:22 AM PGY-1, [place]

## 2020-06-17 NOTE — Plan of Care (Signed)
  Problem: Activity: Goal: Capacity to carry out activities will improve Outcome: Progressing   Problem: Cardiac: Goal: Ability to achieve and maintain adequate cardiopulmonary perfusion will improve Outcome: Progressing   Problem: Education: Goal: Knowledge of General Education information will improve Description: Including pain rating scale, medication(s)/side effects and non-pharmacologic comfort measures Outcome: Progressing   Problem: Health Behavior/Discharge Planning: Goal: Ability to manage health-related needs will improve Outcome: Progressing   Problem: Activity: Goal: Risk for activity intolerance will decrease Outcome: Progressing   Problem: Coping: Goal: Level of anxiety will decrease Outcome: Progressing   Problem: Elimination: Goal: Will not experience complications related to bowel motility Outcome: Progressing

## 2020-06-18 LAB — CULTURE, BLOOD (ROUTINE X 2)
Culture: NO GROWTH
Culture: NO GROWTH
Special Requests: ADEQUATE

## 2020-06-18 LAB — GLUCOSE, CAPILLARY: Glucose-Capillary: 113 mg/dL — ABNORMAL HIGH (ref 70–99)

## 2020-06-18 NOTE — Hospital Course (Addendum)
[name] White is a 64 yr old female who was admitted for shortness of breath and hypoxia due to malfunction of NIV at SNF.  Placement Pt was admitted primarily for placement issues. She was discharged to SNF on BiPAP with machine delivery to the facility prior to discharge.   Deconditioning Pt had on going PT/OT in hospital. She displayed motivation and has enjoyed rehab.  Acute on Chronic Hypoxic/Hypercapneic Respiratory failure Continued on her home medications. Continued use of BiPAP throughout stay. No changes made in hospital.  Wound on Right Buttock  Patient was noted to have scant bleeding from wound, non-tender with induration and no purulent drainage or signs of infection (normal WBC, no fever). Wound care was consulted for recommendation regarding dressing the area.  On day of discharge, patient reported this wound is not painful with minimal drainage.  Normocytic Anemia (Iron Deficiency Anemia) Hemoglobin this admission 12.9 > 9.6 > 9.5 > 9.5.  Ferritin and iron panel demonstrated likely iron deficiency (ferritin 17, iron sat 11)  Baseline Hgb around 9-11 since 2019. Patient was started on p.o. iron 325 mg every other day. Counseled on use of stool softeners while taking p.o. iron.  No transfusion needed during admit.    PCP recommendations: Please place GI referral. Will need colonoscopy+/- EGD outpatient for further evaluation of iron-deficiency anemia and Fhx colon cancer.  Recommend outpatient CBC for follow-up of iron deficiency anemia. Continuation of BiPAP machine at SNF - BiPAP settings: IPAP Max 30 min 8, EPAP 6 (patient must continue BiPAP or she will go into respiratory failure) Recommend follow-up monitoring of gluteal wound drainage and continue application of bacitracin if worsening.

## 2020-06-18 NOTE — Progress Notes (Signed)
Physical Therapy Treatment Patient Details Name: [name] White MRN: [id number] DOB: 1955-09-22 Today's Date: 06/18/2020    History of Present Illness 64 y.o. female with PMH significant for OHS OSA, HFpEF, HTN, paroxysmal A. fib on Eliquis, T2DM, MDD, GERD, osteoarthritis, morbid obesity, remote tobacco use. Pt was at SNF and became SOB and hypoxic. Pt with frequent readmissions for respiratory failure.    PT Comments    Pt is up to walk with PT x 2 with good control of sats during supplemented O2 walk.  Pt is in some knee pain and working with walker to reduce the discomfort.  Follow up with goals of PT for work on balance, endurance and LE strength as knee pain and SOB allow.  Pt is quite motivated and extending an excellent effort.   Follow Up Recommendations  SNF;Supervision/Assistance - 24 hour     Equipment Recommendations  None recommended by PT    Recommendations for Other Services OT consult     Precautions / Restrictions Precautions Precautions: Fall Precaution Comments: monitor SpO2 Restrictions Weight Bearing Restrictions: No    Mobility  Bed Mobility               General bed mobility comments: in chair when PT arrived  Transfers Overall transfer level: Needs assistance Equipment used: Rolling walker (2 wheeled) Transfers: Sit to/from Stand Sit to Stand: Min assist         General transfer comment: cues for hand placement  Ambulation/Gait Ambulation/Gait assistance: Min guard Gait Distance (Feet): 56 Feet (30+26) Assistive device: Rolling walker (2 wheeled) Gait Pattern/deviations: Step-through pattern;Step-to pattern;Wide base of support;Trunk flexed Gait velocity: reduced Gait velocity interpretation: <1.31 ft/sec, indicative of household ambulator General Gait Details: short shuffled steps due to knee pain   Stairs             Wheelchair Mobility    Modified Rankin (Stroke Patients Only)       Balance Overall balance  assessment: Needs assistance Sitting-balance support: Feet supported Sitting balance-Leahy Scale: Good     Standing balance support: Bilateral upper extremity supported;During functional activity Standing balance-Leahy Scale: Poor                              Cognition Arousal/Alertness: Awake/alert Behavior During Therapy: WFL for tasks assessed/performed Overall Cognitive Status: Within Functional Limits for tasks assessed                                        Exercises      General Comments General comments (skin integrity, edema, etc.): pt was able to be assisted on two walks, with sats maintained 89% or greater on 6L O2      Pertinent Vitals/Pain Pain Assessment: 0-10 Pain Score: 5  Pain Location: knees/back Pain Descriptors / Indicators: Guarding;Discomfort;Spasm Pain Intervention(s): Limited activity within patient's tolerance;Monitored during session;Premedicated before session;Repositioned    Home Living                      Prior Function            PT Goals (current goals can now be found in the care plan section) Acute Rehab PT Goals Patient Stated Goal: I am going to walk again Progress towards PT goals: Progressing toward goals    Frequency    Min 3X/week  PT Plan Current plan remains appropriate    Co-evaluation              AM-PAC PT "6 Clicks" Mobility   Outcome Measure  Help needed turning from your back to your side while in a flat bed without using bedrails?: A Little Help needed moving from lying on your back to sitting on the side of a flat bed without using bedrails?: A Little Help needed moving to and from a bed to a chair (including a wheelchair)?: A Little Help needed standing up from a chair using your arms (e.g., wheelchair or bedside chair)?: A Little Help needed to walk in hospital room?: A Little Help needed climbing 3-5 steps with a railing? : A Lot 6 Click Score: 17    End  of Session Equipment Utilized During Treatment: Oxygen Activity Tolerance: Patient tolerated treatment well;Patient limited by fatigue Patient left: in chair;with call bell/phone within reach Nurse Communication: Mobility status PT Visit Diagnosis: Other abnormalities of gait and mobility (R26.89);Muscle weakness (generalized) (M62.81)     Time: [id number] PT Time Calculation (min) (ACUTE ONLY): 40 min  Charges:  $Gait Training: 8-22 mins $Therapeutic Activity: 23-37 mins                    Ramond Dial 06/18/2020, 8:07 PM  Mee Hives, PT MS Acute Rehab Dept. Number: [place] and [place]

## 2020-06-18 NOTE — TOC Progression Note (Signed)
Transition of Care [facility]) - Progression Note    Patient Details  Name: ALEEAH GREENO MRN: [id number] Date of Birth: May 30, 1956  Transition of Care [facility]) CM/SW [place], [place] Phone Number: 06/18/2020, 3:40 PM  Clinical Narrative:     CSW spoke with [facility]. They will check if they have BIPAP available for pt.   CSW called and left message with [facility]  CSW called Romana with [facility] in [place] will review. Clinicals faxed to [phone]  CSW updated pt on potential options. [facility] in [place] is pt's first choice.    Expected Discharge Plan: Skilled Nursing Facility Barriers to Discharge: Continued Medical Work up,No SNF bed  Expected Discharge Plan and Services Expected Discharge Plan: [place]                                               Social Determinants of Health (SDOH) Interventions    Readmission Risk Interventions Readmission Risk Prevention Plan 02/07/2020 07/22/2019 03/08/2019  Transportation Screening Complete Complete Complete  Medication Review [employer] photographer) Complete Complete Complete  PCP or Specialist appointment within 3-5 days of discharge Complete Complete Complete  HRI or Home Care Consult Complete Complete Complete  SW Recovery Care/Counseling Consult Complete Complete Complete  Palliative Care Screening Complete Not Applicable Not [place] Complete Not Applicable Not Applicable  Some recent data might be hidden

## 2020-06-18 NOTE — Progress Notes (Signed)
Family Medicine Teaching Service Daily Progress Note Intern Pager: [phone]  Patient name: [name] White Medical record number: [id number] Date of birth: [birth year] Age: 64 y.o. Gender: female  Primary Care Provider: Richarda Osmond, DO Consultants: None  Code Status: DNR  Pt Overview and Major Events to Date:  12/18-Admitted   Assessment and Plan:  [name] White a 64 y.o.femalewithPMH significant forOHS OSA, HFpEF, HTN, paroxysmal A. fib on Eliquis, T2DM,MDD, GERD, osteoarthritis, morbid obesity,remote tobacco use with 20-pack-year cigarette history.  Acute on Chronic Hypoxic/Hypercapneic Respiratory failure OH Sats 100% on 6L which is her home oxygen. Used BiPAP overnight. Patient is medically stable and ready for discharge to SNF which allows use of her Trilogy machine. -F/u CSW: Waiting to hear back from [facility] in [place]  -BiPAP(trilogy)nightlyand anytime while sleeping -Continue home breathing treatments -PT/OT eval and treat -TOCreferral  Deconditioning Patient reports she has been doing 1 hour daily of PT/OT at her SNF. She would like to continue rehab daily while in hospital waiting for SNF. PT recommends SNF with 24-hour supervision assistance.  -On going PT while in hospital  -TOC and CSW, appreciate placement help  HFpEFHypertension BP 129/109 Home medications:atorvastatin 40mg  daily, Cardizem 120mg  daily, metoprolol 12.5mg  BID  -Continueatorvastatin 40mg  daily -Continue Cardizem 120mg  daily -Continue metoprolol 12.5mg  BID   Paroxysmal A. fib on Eliquis HR 75-106 Home meds: flecainide 100mg  BID, Cardizem 120mg  daily, metoprolol 12.5mg  BID , Eliquis 5mg  BID  -Continuous telemetry 24 to 48 hours -Continue flecainide 100mg  BID, Cardizem 120mg  daily, metoprolol 12.5mg  BID , Eliquis 5mg  BID   Type 2 diabetes mellitus CBG 113, diet controlled -Follow-up daily glucose  Thrombocytosis, chronic, stable Platelets 563 on  admission,consistent with her normal.No daily CBC ordered given medically stable and ready for SNF.  NormocyticAnemia-Chronic, stable Hemoglobin 10.9 on admission,increased to 12.9.No daily CBC ordered given medically stable and ready forSNF.  Rhinorrhea-chronic, improved -Atroventnasal spray PRN  MDD: Chronic, stable Home meds: Venlafaxine 187.5 mg daily. -Continue Venlafaxine   GERD: Chronic, stable Home meds:omeprazole 20 mg daily. -Protonix formulary  Osteoarthritis: Chronic, stable Home meds:gabapentin 200 mg 3 times daily. -Continue home med  FEN/GI:N.p.o. while on BiPAP, otherwise heart healthy diet Prophylaxis:On Eliquis  Status is: Inpatient  Remains inpatient appropriate because:Unsafe d/c plan   Dispo: The patient is from: SNF              Anticipated d/c is to: SNF              Anticipated d/c date is: 2 days              Patient currently is medically stable to d/c.   Subjective:  No acute events overnight. Pt bathing on commode this morning. She said she has spoken to CSW about SNF beds outside of [place]. She asked me to come back and see her later today.    Objective: Temp:  [97.7 F (36.5 C)-98.3 F (36.8 C)] 97.7 F (36.5 C) (12/23 0754) Pulse Rate:  [75-106] 106 (12/23 0754) Resp:  [17-20] 18 (12/23 0754) BP: (115-132)/(66-109) 129/109 (12/23 0754) SpO2:  [97 %-100 %] 100 % (12/23 0754) FiO2 (%):  [40 %] 40 % (12/23 0155) Weight:  [164.3 kg] 164.3 kg (12/23 0426)  Physical Exam: General: Alert, no acute distress, pleasant  Cardio: well perfused  Pulm: breathing comfortably  Neuro: Cranial nerves grossly intact  Laboratory: Recent Labs  Lab 06/13/20 1822 06/13/20 1847  WBC 9.1  --   HGB 10.9* 12.9  HCT  37.6 38.0  PLT 563*  --    Recent Labs  Lab 06/13/20 1822 06/13/20 1847  NA 141 142  K 4.0 4.2  CL 93*  --   CO2 37*  --   BUN 7*  --   CREATININE 0.53  --   CALCIUM 9.4  --   PROT 7.9  --   BILITOT 0.5   --   ALKPHOS 99  --   ALT 21  --   AST 22  --   GLUCOSE 136*  --     Imaging/Diagnostic Tests: No results found.   Lattie Haw, MD 06/18/2020, 8:54 AM PGY-2, [place] Intern pager: [phone], text pages welcome

## 2020-06-18 NOTE — Progress Notes (Signed)
Occupational Therapy Treatment Patient Details Name: [name] White MRN: [id number] DOB: June 11, 1956 Today's Date: 06/18/2020    History of present illness 64 y.o. female with PMH significant for OHS OSA, HFpEF, HTN, paroxysmal A. fib on Eliquis, T2DM, MDD, GERD, osteoarthritis, morbid obesity, remote tobacco use. Pt was at SNF and became SOB and hypoxic. Pt with frequent readmissions for respiratory failure.   OT comments  Pt with good tolerance of ADL on 6L 02. Needing assist for back and posterior pericare. Pt directs her own care well. Highly motivated. Remained in chair at end of session.   Follow Up Recommendations  SNF    Equipment Recommendations  None recommended by OT    Recommendations for Other Services      Precautions / Restrictions Precautions Precautions: Fall       Mobility Bed Mobility Overal bed mobility: Modified Independent             General bed mobility comments: increased time, HOB up, use of rail  Transfers Overall transfer level: Needs assistance   Transfers: Sit to/from Stand Sit to Stand: Min guard         General transfer comment: slow to rise, but no physical assistance    Balance Overall balance assessment: Needs assistance   Sitting balance-Leahy Scale: Good       Standing balance-Leahy Scale: Poor                             ADL either performed or assessed with clinical judgement   ADL Overall ADL's : Needs assistance/impaired     Grooming: Wash/dry hands;Wash/dry face;Sitting;Set up Grooming Details (indicate cue type and reason): on BSC Upper Body Bathing: Sitting;Minimal assistance Upper Body Bathing Details (indicate cue type and reason): Min A for bathing back, applying lotion     Upper Body Dressing : Sitting;Minimal assistance       Toilet Transfer: Min guard;Stand-pivot;BSC   Toileting- Clothing Manipulation and Hygiene: Sit to/from stand;Maximal assistance Toileting - Clothing  Manipulation Details (indicate cue type and reason): for back pericare       General ADL Comments: Pt directing her own care.     Vision       Perception     Praxis      Cognition Arousal/Alertness: Awake/alert Behavior During Therapy: WFL for tasks assessed/performed Overall Cognitive Status: Within Functional Limits for tasks assessed                                 General Comments: Very motivated, directs her own care.        Exercises     Shoulder Instructions       General Comments      Pertinent Vitals/ Pain       Pain Assessment: 0-10 Pain Score: 4  Pain Location: knees/back Pain Descriptors / Indicators: Sore;Aching Pain Intervention(s): Repositioned;RN gave pain meds during session  Home Living                                          Prior Functioning/Environment              Frequency  Min 2X/week        Progress Toward Goals  OT Goals(current goals can now be found in the care plan  section)  Progress towards OT goals: Progressing toward goals  Acute Rehab OT Goals Patient Stated Goal: I am going to walk again OT Goal Formulation: With patient Time For Goal Achievement: 06/29/20 Potential to Achieve Goals: Good  Plan Discharge plan remains appropriate    Co-evaluation                 AM-PAC OT "6 Clicks" Daily Activity     Outcome Measure   Help from another person eating meals?: None Help from another person taking care of personal grooming?: A Little Help from another person toileting, which includes using toliet, bedpan, or urinal?: A Lot Help from another person bathing (including washing, rinsing, drying)?: A Lot Help from another person to put on and taking off regular upper body clothing?: A Little Help from another person to put on and taking off regular lower body clothing?: A Lot 6 Click Score: 16    End of Session Equipment Utilized During Treatment: Oxygen;Rolling  walker  OT Visit Diagnosis: Unsteadiness on feet (R26.81);Other abnormalities of gait and mobility (R26.89);Muscle weakness (generalized) (M62.81);Other (comment)   Activity Tolerance Patient tolerated treatment well   Patient Left in chair;with call bell/phone within reach   Nurse Communication          Time: 0850-1005 OT Time Calculation (min): 75 min  Charges: OT General Charges $OT Visit: 1 Visit OT Treatments $Self Care/Home Management : 68-82 mins  Nestor Lewandowsky, OTR/L Acute Rehabilitation Services Pager: [phone] Office: [phone]   Malka So 06/18/2020, 10:13 AM

## 2020-06-19 LAB — GLUCOSE, CAPILLARY: Glucose-Capillary: 138 mg/dL — ABNORMAL HIGH (ref 70–99)

## 2020-06-19 MED ORDER — IPRATROPIUM-ALBUTEROL 0.5-2.5 (3) MG/3ML IN SOLN
3.0000 mL | Freq: Three times a day (TID) | RESPIRATORY_TRACT | Status: DC
  Administered 2020-06-19 – 2020-06-26 (×23): 3 mL via RESPIRATORY_TRACT
  Filled 2020-06-19 (×23): qty 3

## 2020-06-19 NOTE — Progress Notes (Signed)
Family Medicine Teaching Service Daily Progress Note Intern Pager: [phone]  Patient name: [name] White Medical record number: [id number] Date of birth: May 29, 1956 Age: 64 y.o. Gender: female  Primary Care Provider: Richarda Osmond, DO Consultants: None  Code Status: DNR  Pt Overview and Major Events to Date:  12/18-Admitted  12/24: area on buttocks draining, tolerating nightly BiPap and   Assessment and Plan:  [name] White a 64 y.o.femalewithPMH significant forOHS OSA, HFpEF, HTN, paroxysmal A. fib on Eliquis, T2DM,MDD, GERD, osteoarthritis, morbid obesity,remote tobacco use with 20-pack-year cigarette history.  Acute on Chronic Hypoxic/Hypercapneic Respiratory failure OH Sats 99% on 6L which is her home oxygen. Patient is tolerating BiPAP overnight. Patient is medically stable and ready for discharge to SNF which allows use of her Trilogy machine. - F/u CSW: Waiting to hear back from [facility] in [place]  - BiPAP(trilogy)nightlyand anytime while sleeping - Continue home breathing treatments - PT/OT eval and treat - TOCreferral  Buttock Wound  Patient with scant bleeding from area on right buttock. No purulent drainage identified. Afebrile o/n. Area is nontender with some induration.  - consult to wound care regarding dressing recommendations   Deconditioning Patient reports she has been doing 1 hour daily of PT/OT at her SNF. She would like to continue rehab daily while in hospital waiting for SNF. PT recommends SNF with 24-hour supervision assistance.  - patient is continuing to work with PT while inpatient  - TOC and CSW, appreciate placement help  HFpEFHypertension BP 122/71 Home medications:atorvastatin 40mg  daily, Cardizem 120mg  daily, metoprolol 12.5mg  BID  - Continueatorvastatin 40mg  daily - Continue Cardizem 120mg  daily - Continue metoprolol 12.5mg  BID   Paroxysmal A. fib on Eliquis HR 80-100 Home meds: flecainide  100mg  BID, Cardizem 120mg  daily, metoprolol 12.5mg  BID , Eliquis 5mg  BID  -Continue flecainide 100mg  BID, Cardizem 120mg  daily, metoprolol 12.5mg  BID , Eliquis 5mg  BID   Type 2 diabetes mellitus CBG 113, diet controlled -Follow-up daily glucose  Thrombocytosis, chronic, stable Platelets 563 on admission,consistent with her normal.No daily CBC ordered given medically stable and ready for SNF.  NormocyticAnemia-Chronic, stable Hemoglobin 10.9 on admission,increased to 12.9.No daily CBC ordered given medically stable and ready forSNF.  Rhinorrhea-chronic, improved -Atroventnasal spray PRN  MDD: Chronic, stable Home meds: Venlafaxine 187.5 mg daily. -Continue Venlafaxine   GERD: Chronic, stable Home meds:omeprazole 20 mg daily. -Protonix formulary  Osteoarthritis: Chronic, stable Home meds:gabapentin 200 mg 3 times daily. -Continue home med  FEN/GI:N.p.o. while on BiPAP, otherwise heart healthy diet Prophylaxis:On Eliquis  Status is: Inpatient  Remains inpatient appropriate because:Unsafe d/c plan   Dispo: The patient is from: SNF              Anticipated d/c is to: SNF              Anticipated d/c date is: 2 days              Patient currently is medically stable to d/c.   Subjective:  No acute events overnight. Pt bathing on commode this morning. She said she has spoken to CSW about SNF beds outside of [place]. She asked me to come back and see her later today.    Objective: Temp:  [98.3 F (36.8 C)-99.2 F (37.3 C)] 98.3 F (36.8 C) (12/24 1127) Pulse Rate:  [80-100] 81 (12/24 1127) Resp:  [17-20] 20 (12/24 1127) BP: (107-130)/(70-77) 122/71 (12/24 1127) SpO2:  [96 %-100 %] 99 % (12/24 1127) FiO2 (%):  [40 %] 40 % (12/24  0141) Weight:  [163.6 kg] 163.6 kg (12/24 [id number])  Physical Exam: General: Alert, no acute distress, pleasant  Cardio: well perfused  Pulm: breathing comfortably  Neuro: Cranial nerves grossly  intact  Laboratory: Recent Labs  Lab 06/13/20 1822 06/13/20 1847  WBC 9.1  --   HGB 10.9* 12.9  HCT 37.6 38.0  PLT 563*  --    Recent Labs  Lab 06/13/20 1822 06/13/20 1847  NA 141 142  K 4.0 4.2  CL 93*  --   CO2 37*  --   BUN 7*  --   CREATININE 0.53  --   CALCIUM 9.4  --   PROT 7.9  --   BILITOT 0.5  --   ALKPHOS 99  --   ALT 21  --   AST 22  --   GLUCOSE 136*  --     Imaging/Diagnostic Tests: No results found.   Eulis Foster, MD 06/19/2020, 1:12 PM PGY-2, [place] Intern pager: [phone], text pages welcome

## 2020-06-20 LAB — GLUCOSE, CAPILLARY: Glucose-Capillary: 121 mg/dL — ABNORMAL HIGH (ref 70–99)

## 2020-06-20 MED ORDER — HYDROCORTISONE 1 % EX CREA
TOPICAL_CREAM | Freq: Two times a day (BID) | CUTANEOUS | Status: DC
  Administered 2020-06-20: 1 via TOPICAL
  Filled 2020-06-20: qty 28

## 2020-06-20 MED ORDER — BACITRACIN ZINC 500 UNIT/GM EX OINT
TOPICAL_OINTMENT | Freq: Two times a day (BID) | CUTANEOUS | Status: DC
  Administered 2020-06-20 – 2020-06-26 (×4): 1 via TOPICAL
  Filled 2020-06-20: qty 28.4

## 2020-06-20 MED ORDER — POLYETHYLENE GLYCOL 3350 17 G PO PACK
17.0000 g | PACK | Freq: Every day | ORAL | Status: DC
  Filled 2020-06-20 (×6): qty 1

## 2020-06-20 MED ORDER — LIDOCAINE HCL URETHRAL/MUCOSAL 2 % EX GEL
1.0000 "application " | Freq: Once | CUTANEOUS | Status: AC
  Administered 2020-06-20: 1 via TOPICAL
  Filled 2020-06-20: qty 11

## 2020-06-20 NOTE — Progress Notes (Signed)
Pt placed on Cpap with 7 lt [place]. Tol well

## 2020-06-20 NOTE — Progress Notes (Signed)
Family Medicine Teaching Service Daily Progress Note Intern Pager: [phone]  Patient name: [name] White Medical record number: [id number] Date of birth: 1955-08-25 Age: 64 y.o. Gender: female  Primary Care Provider: Richarda Osmond, DO Consultants: None  Code Status: DNR  Pt Overview and Major Events to Date:  12/18-Admitted  12/24: area on buttocks draining, tolerating nightly BiPap and   Assessment and Plan:  [name] White a 64 y.o.femalewithPMH significant forOHS OSA, HFpEF, HTN, paroxysmal A. fib on Eliquis, T2DM,MDD, GERD, osteoarthritis, morbid obesity,remote tobacco use with 20-pack-year cigarette history.  Acute on Chronic Hypoxic/Hypercapneic Respiratory failure OH Sats 99% on 6L which is her home oxygen. Patient is tolerating BiPAP overnight. Patient is medically stable and ready for discharge to SNF which allows use of her Trilogy machine. - F/u CSW: Waiting to hear back from [facility] in [place]  - BiPAP(trilogy)nightlyand anytime while sleeping - Continue home breathing treatments - PT/OT eval and treat - TOCreferral  Pilonidal cyst- chronic. Poor surgical candidate due to respiratory status and blood thinner. Has been treated with topical bacitracin in the past.   - bacitracin topical BID - lidocaine/hydrocortizone at anus for pain - miralax daily - f/u WOC   Deconditioning- patient is making excellent progress with PT and continues to work toward independent walking - patient is continuing to work with PT while inpatient  - TOC and CSW, appreciate placement help  HFpEFHypertension- chronic, stable. 120s/70s on average. - Continue home meds: Continueatorvastatin 40mg  daily, Cardizem 120mg  daily, metoprolol 12.5mg  BID   Paroxysmal A. fib on Eliquis- chronic, stable. HR 80-100 -Continue home meds: flecainide 100mg  BID, Cardizem 120mg  daily, metoprolol 12.5mg  BID , Eliquis 5mg  BID   Type 2 diabetes mellitus-chronic,  stable. diet controlled. CBG 121 today -Follow-up daily glucose  Thrombocytosis, chronic, stable Platelets 563 on admission,consistent with her normal. - CBC am once for monitoring  NormocyticAnemia-Chronic, stable. No labs since admission. Hemoglobin 10.9 on admission,increased to 12.9. - CBC am once for monitoring  Rhinorrhea-chronic, improved -Atroventnasal spray PRN  MDD: Chronic, stable -Continue Home meds: Venlafaxine 187.5 mg daily  GERD: Chronic, stable Home meds:omeprazole 20 mg daily. -Protonix formulary  Osteoarthritis: Chronic, stable -Continue home med: gabapentin 200 mg 3 times daily.  FEN/GI:N.p.o. while on BiPAP, otherwise heart healthy diet Prophylaxis:On Eliquis  Status is: Inpatient  Remains inpatient appropriate because:Unsafe d/c plan   Dispo: The patient is from: SNF              Anticipated d/c is to: SNF              Anticipated d/c date is: 2 days              Patient currently is medically stable to d/c.   Subjective:  Patient is doing well overall and in good spirits. Her niece may be visiting her today depending on the bus schedule. She is very happy with her progress made with PT and is motivated to keep working with them. She is concerned about the cyst on her bottom which is tender and has some mild drainage.   Objective: Temp:  [97.5 F (36.4 C)-98.9 F (37.2 C)] 98.4 F (36.9 C) (12/25 0412) Pulse Rate:  [80-100] 80 (12/25 0412) Resp:  [16-20] 18 (12/25 0412) BP: (111-129)/(64-87) 126/87 (12/25 0412) SpO2:  [96 %-100 %] 100 % (12/25 0412) Weight:  [164.1 kg] 164.1 kg (12/25 0059)  Physical Exam: General: Alert, no acute distress, pleasant  Cardio: well perfused, RRR  Pulm: breathing comfortably,  no increased WOB, on [place] 6L  Derm: pilonidal cyst on right of gluteal cleft with tract medially. No tenderness with palpation. Pressure on tract expresses mild sanguinous pus. No surrounding induration or erythema. Did  not appreciate exterior hemorrhoids.  Neuro: alert and oriented. No focal deficits.  Laboratory: Recent Labs  Lab 06/13/20 1822 06/13/20 1847  WBC 9.1  --   HGB 10.9* 12.9  HCT 37.6 38.0  PLT 563*  --    Recent Labs  Lab 06/13/20 1822 06/13/20 1847  NA 141 142  K 4.0 4.2  CL 93*  --   CO2 37*  --   BUN 7*  --   CREATININE 0.53  --   CALCIUM 9.4  --   PROT 7.9  --   BILITOT 0.5  --   ALKPHOS 99  --   ALT 21  --   AST 22  --   GLUCOSE 136*  --     Imaging/Diagnostic Tests: No results found.   Richarda Osmond, DO 06/20/2020, 7:29 AM PGY-3, [place] Intern pager: [phone], text pages welcome

## 2020-06-21 DIAGNOSIS — R0602 Shortness of breath: Secondary | ICD-10-CM

## 2020-06-21 LAB — CBC
HCT: 35.1 % — ABNORMAL LOW (ref 36.0–46.0)
Hemoglobin: 9.6 g/dL — ABNORMAL LOW (ref 12.0–15.0)
MCH: 26.1 pg (ref 26.0–34.0)
MCHC: 27.4 g/dL — ABNORMAL LOW (ref 30.0–36.0)
MCV: 95.4 fL (ref 80.0–100.0)
Platelets: 400 10*3/uL (ref 150–400)
RBC: 3.68 MIL/uL — ABNORMAL LOW (ref 3.87–5.11)
RDW: 17 % — ABNORMAL HIGH (ref 11.5–15.5)
WBC: 8.2 10*3/uL (ref 4.0–10.5)
nRBC: 0 % (ref 0.0–0.2)

## 2020-06-21 LAB — BASIC METABOLIC PANEL
Anion gap: 9 (ref 5–15)
BUN: 9 mg/dL (ref 8–23)
CO2: 40 mmol/L — ABNORMAL HIGH (ref 22–32)
Calcium: 8.8 mg/dL — ABNORMAL LOW (ref 8.9–10.3)
Chloride: 91 mmol/L — ABNORMAL LOW (ref 98–111)
Creatinine, Ser: 0.47 mg/dL (ref 0.44–1.00)
GFR, Estimated: >60 mL/min (ref >60)
Glucose, Bld: 127 mg/dL — ABNORMAL HIGH (ref 70–99)
Potassium: 3.8 mmol/L (ref 3.5–5.1)
Sodium: 140 mmol/L (ref 135–145)

## 2020-06-21 LAB — GLUCOSE, CAPILLARY: Glucose-Capillary: 97 mg/dL (ref 70–99)

## 2020-06-21 NOTE — Progress Notes (Signed)
Family Medicine Teaching Service Daily Progress Note Intern Pager: [phone]  Patient name: [name] White Medical record number: [id number] Date of birth: [birth year] Age: 64 y.o. Gender: female  Primary Care Provider: Richarda Osmond, DO Consultants: None  Code Status: DNR  Pt Overview and Major Events to Date:  12/18-Admitted  12/24: area on buttocks draining, tolerating nightly BiPap and   Assessment and Plan:  [name] White a 64 y.o.femalewithPMH significant forOHS OSA, HFpEF, HTN, paroxysmal A. fib on Eliquis, T2DM,MDD, GERD, osteoarthritis, morbid obesity,remote tobacco use with 20-pack-year cigarette history.  Acute on Chronic Hypoxic/Hypercapneic Respiratory failure OH Sats 99% on 6L which is her home oxygen. Patient is tolerating BiPAP overnight. Patient is medically stable and ready for discharge to SNF which allows use of her Trilogy machine. - F/u CSW: Waiting to hear back from [facility] in [place]  - BiPAP(trilogy)nightlyand anytime while sleeping - Continue home breathing treatments - PT/OT eval and treat - TOCreferral  Pilonidal cyst, unchanged, chronic: Poor surgical candidate due to respiratory status and blood thinner. Has been treated with topical bacitracin in the past.   - bacitracin topical BID - lidocaine/hydrocortizone at anus for pain - miralax daily - f/u WOC   Deconditioning- patient is making excellent progress with PT and continues to work toward independent walking - patient is continuing to work with PT while inpatient  - TOC and CSW, appreciate placement help  HFpEFHypertension- chronic, stable. 120s/70s on average. - Continue home meds: Continueatorvastatin 40mg  daily, Cardizem 120mg  daily, metoprolol 12.5mg  BID   Paroxysmal A. fib on Eliquis- chronic, stable. HR 80-100 -Continue home meds: flecainide 100mg  BID, Cardizem 120mg  daily, metoprolol 12.5mg  BID , Eliquis 5mg  BID   Type 2 diabetes  mellitus-chronic, stable. diet controlled. CBG 121 today -Follow-up daily glucose  Thrombocytosis, chronic, stable Platelets 563 on admission,consistent with her normal. - CBC am once for monitoring  NormocyticAnemia-Chronic, stable. No labs since admission. Hemoglobin 10.9 on admission,increased to 12.9. - CBC am once for monitoring  Rhinorrhea - chronic, improved -Atroventnasal spray PRN  MDD: Chronic, stable -Continue Home meds: Venlafaxine 187.5 mg daily  GERD: Chronic, stable Home meds:omeprazole 20 mg daily. -Protonix formulary  Osteoarthritis: Chronic, stable -Continue home med: gabapentin 200 mg 3 times daily.  FEN/GI:N.p.o. while on BiPAP, otherwise heart healthy diet Prophylaxis:On Eliquis  Status is: Inpatient  Remains inpatient appropriate because:Unsafe d/c plan   Dispo: The patient is from: SNF              Anticipated d/c is to: SNF              Anticipated d/c date is: 2 days              Patient currently is medically stable to d/c.   Subjective:  Patient seen resting peacefully this morning with CPAP in place.   Objective: Temp:  [98.1 F (36.7 C)-98.7 F (37.1 C)] 98.5 F (36.9 C) (12/26 0540) Pulse Rate:  [72-92] 72 (12/26 0540) Resp:  [18-20] 20 (12/26 0540) BP: (112-126)/(58-75) 113/65 (12/26 0540) SpO2:  [94 %-100 %] 100 % (12/26 0540) Weight:  [162.3 kg] 162.3 kg (12/26 0540)  Physical Exam: General: Alert, no acute distress, pleasant  Cardio: well perfused, RRR  Pulm: breathing comfortably, no increased WOB, on [address] 7L  Derm: pilonidal cyst R of gluteal cleft tracts medially. No TTP. Able to express mild sanguinous pus. No surrounding induration or erythema. Neuro: alert and oriented. No focal deficits.  Laboratory: Recent Labs  Lab 06/21/20  0404  WBC 8.2  HGB 9.6*  HCT 35.1*  PLT 400   Recent Labs  Lab 06/21/20 0404  NA 140  K 3.8  CL 91*  CO2 40*  BUN 9  CREATININE 0.47  CALCIUM 8.8*  GLUCOSE 127*     Imaging/Diagnostic Tests: No results found.   Daisy Floro, DO 06/21/2020, 7:29 AM PGY-3, [place] Intern pager: [phone], text pages welcome

## 2020-06-21 NOTE — Plan of Care (Signed)
  Problem: Education: Goal: Ability to verbalize understanding of medication therapies will improve Outcome: Progressing   Problem: Nutrition: Goal: Adequate nutrition will be maintained Outcome: Progressing   Problem: Elimination: Goal: Will not experience complications related to bowel motility Outcome: Progressing Goal: Will not experience complications related to urinary retention Outcome: Progressing

## 2020-06-22 DIAGNOSIS — Z789 Other specified health status: Secondary | ICD-10-CM | POA: Diagnosis present

## 2020-06-22 DIAGNOSIS — D649 Anemia, unspecified: Secondary | ICD-10-CM | POA: Diagnosis present

## 2020-06-22 LAB — CBC WITH DIFFERENTIAL/PLATELET
Abs Immature Granulocytes: 0.04 10*3/uL (ref 0.00–0.07)
Basophils Absolute: 0 10*3/uL (ref 0.0–0.1)
Basophils Relative: 1 %
Eosinophils Absolute: 0.5 10*3/uL (ref 0.0–0.5)
Eosinophils Relative: 6 %
HCT: 34.3 % — ABNORMAL LOW (ref 36.0–46.0)
Hemoglobin: 9.5 g/dL — ABNORMAL LOW (ref 12.0–15.0)
Immature Granulocytes: 1 %
Lymphocytes Relative: 16 %
Lymphs Abs: 1.3 10*3/uL (ref 0.7–4.0)
MCH: 26.4 pg (ref 26.0–34.0)
MCHC: 27.7 g/dL — ABNORMAL LOW (ref 30.0–36.0)
MCV: 95.3 fL (ref 80.0–100.0)
Monocytes Absolute: 0.6 10*3/uL (ref 0.1–1.0)
Monocytes Relative: 7 %
Neutro Abs: 5.8 10*3/uL (ref 1.7–7.7)
Neutrophils Relative %: 69 %
Platelets: 386 10*3/uL (ref 150–400)
RBC: 3.6 MIL/uL — ABNORMAL LOW (ref 3.87–5.11)
RDW: 16.9 % — ABNORMAL HIGH (ref 11.5–15.5)
WBC: 8.2 10*3/uL (ref 4.0–10.5)
nRBC: 0 % (ref 0.0–0.2)

## 2020-06-22 LAB — GLUCOSE, CAPILLARY: Glucose-Capillary: 99 mg/dL (ref 70–99)

## 2020-06-22 NOTE — Care Management Important Message (Signed)
Important Message  Patient Details  Name: [name] White MRN: [id number] Date of Birth: 06/10/1956   Medicare Important Message Given:  Yes     Renie Ora 06/22/2020, 9:52 AM

## 2020-06-22 NOTE — Progress Notes (Signed)
Interim progress note:  Went to check in on patient for the night shift.  Upon arrival to the room she is awake and smiling, just recently had her BiPAP placed.  She reports she is feeling much better and having no difficulty with her breathing.  About to go to sleep shortly.  On evaluation, she is breathing comfortably and speaking in full sentences without difficulty.  Will continue her plan as per day progress note and monitor overnight with BiPAP on.  Allayne Stack, DO

## 2020-06-22 NOTE — Progress Notes (Signed)
RT NOTE: PT requesting to go back on CPAP at this time. RT placed patient on dreamstation. Patient resting at this time. VSS. RT will continue to monitor.

## 2020-06-22 NOTE — Progress Notes (Addendum)
Called and left message with Jaslyne from [place] in [place]  1445: [name] White called back; stated no beds available.

## 2020-06-22 NOTE — Progress Notes (Addendum)
Family Medicine Teaching Service Daily Progress Note Intern Pager: [phone]  Patient name: [name] White Medical record number: [id number] Date of birth: 06-30-1955 Age: 64 y.o. Gender: female  Primary Care Provider: Leeroy Bock, DO Consultants: None Code Status: Full  Pt Overview and Major Events to Date:  12/18-Admitted  12/24: Noted drainage of pilonidal cyst  Assessment and Plan SAKEENAH VALCARCEL a 64 y.o.femalewithPMH significant forOHS OSA, HFpEF, HTN, paroxysmal A. fib on Eliquis, T2DM,MDD, GERD, osteoarthritis, morbid obesity,remote tobacco use with 20-pack-year cigarette history.  Acute on Chronic Hypoxic/Hypercapneic Respiratory failure OH Sats 99% on 6L which is her home oxygen. Patient is tolerating BiPAP overnight.  At morning rounds, patient was on BiPAP, likely for comfort.  Patient said "they were late with my breathing treatment so I needed the BiPAP".  Patient appears to be doing well, no acute distress.  We will recheck in a couple hours. Patient is medically stable and ready for discharge to SNF which allows use of her Trilogy machine. - F/u CSW: Waiting to hear back from [facility] in [place]  - BiPAP(trilogy)nightlyand anytime while sleeping - Continue home breathing treatments - PT/OT eval and treat - TOCreferral  Pilonidal cyst, unchanged, chronic:  Poor surgical candidate due to respiratory status and blood thinner. Has been treated with topical bacitracin in the past.   - bacitracin topical BID - lidocaine/hydrocortizone at anus for pain - miralax daily - f/u WOC   NormocyticAnemia-Chronic Hemoglobin 10.9 on admission >12.9 > 9.6 > 9.5.  No Daily labs given stable. -Follow-up iron panel ordered for 12/28 AM -Given results of iron panel will likely start her on p.o. iron -Recommend outpatient referral to [facility] for colonoscopy and possible EGD to elucidate etiology of normocytic anemia  Paroxysmal A. fib on  Eliquis  AV block noted on EKG EKG 12/18 demonstrates second-degree AV block.  Repeat EKG today 12/27 shows PR interval upper end of normal at 200 ms.  At this time, do not believe she requires cardiology consult.  Asymptomatic.  Will follow up with EKG in a couple days. -Continue home meds: flecainide 100mg  BID, Cardizem 120mg  daily, metoprolol 12.5mg  BID , Eliquis 5mg  BID   Deconditioning Patient is making excellent progress with PT and continues to work toward independent walking - patient is continuing to work with PT while inpatient  - TOC and CSW, appreciate placement help  HFpEFHypertension - Continue home meds: Continueatorvastatin 40mg  daily, Cardizem 120mg  daily, metoprolol 12.5mg  BID   Type 2 diabetes mellitus-chronic, stable. diet controlled. CBG 121 today -Follow-up daily glucose  Thrombocytosis, chronic, stable Platelets 563 on admission > 400 > 386.  Rhinorrhea - chronic, improved -Atroventnasal spray PRN  MDD: Chronic, stable -Continue Home meds: Venlafaxine 187.5 mg daily  GERD: Chronic, stable Home meds:omeprazole 20 mg daily. -Protonix formulary  Osteoarthritis: Chronic, stable -Continue home med: gabapentin 200 mg 3 times daily.  FEN/GI:N.p.o. while on BiPAP, otherwise heart healthy diet Prophylaxis:On Eliquis   Status is: Inpatient  Remains inpatient appropriate because:Awaiting SNF placement   Dispo: The patient is from: SNF              Anticipated d/c is to: SNF              Anticipated d/c date is: 1 day              Patient currently is medically stable to d/c.   Subjective:  At morning rounds, patient was on BiPAP, likely for comfort.  Patient said "they  were late with my breathing treatment so I needed the BiPAP".  Patient appears to be doing well, no acute distress.  We will recheck in a couple hours.  No other complaints.  Objective: Temp:  [98 F (36.7 C)-98.7 F (37.1 C)] 98 F (36.7 C) (12/27 0626) Pulse Rate:   [79-101] 88 (12/27 0626) Resp:  [17-18] 18 (12/27 0626) BP: (108-147)/(63-77) 131/65 (12/27 0626) SpO2:  [98 %-100 %] 100 % (12/27 0626) Weight:  [162.8 kg] 162.8 kg (12/27 0100)  Physical Exam: General: Awake, alert, oriented, on BiPAP, no acute distress Cardiovascular: Regular rate and rhythm, no murmurs appreciated Respiratory: Clear to auscultation bilaterally, no respiratory distress Abdomen: Obese abdomen, nondistended, soft Extremities: Moving all spontaneously, no BLE edema  Laboratory: Recent Labs  Lab 06/21/20 0404 06/22/20 0333  WBC 8.2 8.2  HGB 9.6* 9.5*  HCT 35.1* 34.3*  PLT 400 386   Recent Labs  Lab 06/21/20 0404  NA 140  K 3.8  CL 91*  CO2 40*  BUN 9  CREATININE 0.47  CALCIUM 8.8*  GLUCOSE 127*    Imaging/Diagnostic Tests: None last 24 hours.   Fayette Pho, MD 06/22/2020, 7:06 AM PGY-1, [facility] Family Medicine FPTS Intern pager: [phone], text pages welcome

## 2020-06-22 NOTE — Progress Notes (Signed)
PT Cancellation Note  Patient Details Name: [name] White MRN: [id number] DOB: 1955-08-20   Cancelled Treatment:    Reason Eval/Treat Not Completed: Other (comment) Pt refusing PT x 2 due to "lots of problems with my breathing today," and requesting to stay on CPAP longer. Discussed importance of mobility. Pt agreeable to try tomorrow.  Lillia Pauls, PT, DPT Acute Rehabilitation Services Pager [phone] Office [phone]    Norval Morton 06/22/2020, 3:25 PM

## 2020-06-22 NOTE — Progress Notes (Addendum)
FPTS Interim Progress Note  S: Went to visit patient for afternoon check of respiratory status. Upon entering the room, the lights were off, room was darkened, she was wearing BiPAP and napping comfortably in front of the television with the volume up. She awoke easily to verbal stimulation. She reported "struggling" with her breathing today. She said she was off BiPAP for a little but late morning but "was struggling too much" so the RT put her back on BiPAP at her request. She cancelled PT today, see PT note. Also reports pilonidal cyst doing much better, draining well, not painful.   O: BP 121/75 (BP Location: Right Arm)   Pulse 100   Temp 98 F (36.7 C) (Oral)   Resp 20   Ht 5\' 5"  (1.651 m)   Wt (!) 162.8 kg   SpO2 96%   BMI 59.73 kg/m   General: comfortably asleep, easily awoken, using high-pitched cheerful voice  Respiratory: on BiPAP, breathing comfortably, no respiratory distress, speaking in full sentences Pilonidal cyst: not actually pilonidal cyst, ended up being small folliculitis of right medial buttock cleft approx 6-8 inches distal from tailbone draining small amount white-ish fluid  A/P: - Encourage BiPAP only while napping and at bedtime - Encourage ambulation with PT and nursing - For normocytic anemia, follow up AM CBC, ferritin, and iron level - Interestingly, patient does not have diagnosis of anemia in chart; however, anemic-level Hgb in chart since 08/08/2018 (previous measurements of 8-9.8 on several occasions including 1,2,08/2019, 05/2019, 02/2019) - Consider starting PO iron given iron studies showing deficiency - Use chaperone for inspection of gluteal follicular cyst, given sensitive location (failed to use chaperone for this interim exam because of presumed location of "pilonidal cyst", now know to be much more on the medial right buttock)  Ezequiel Essex, MD 06/22/2020, 4:10 PM PGY-1, [facility] Service pager [phone]

## 2020-06-22 NOTE — Progress Notes (Signed)
Pt complained of SOVB, breathing tx was given. Placed on CPAP by RT. Pt requested to take her morning meds after lunch.

## 2020-06-23 DIAGNOSIS — E662 Morbid (severe) obesity with alveolar hypoventilation: Secondary | ICD-10-CM | POA: Diagnosis not present

## 2020-06-23 LAB — IRON AND TIBC
Iron: 48 ug/dL (ref 28–170)
Saturation Ratios: 11 % (ref 10.4–31.8)
TIBC: 421 ug/dL (ref 250–450)
UIBC: 373 ug/dL

## 2020-06-23 LAB — FERRITIN: Ferritin: 17 ng/mL (ref 11–307)

## 2020-06-23 LAB — CBC
HCT: 33.5 % — ABNORMAL LOW (ref 36.0–46.0)
Hemoglobin: 9.5 g/dL — ABNORMAL LOW (ref 12.0–15.0)
MCH: 26.7 pg (ref 26.0–34.0)
MCHC: 28.4 g/dL — ABNORMAL LOW (ref 30.0–36.0)
MCV: 94.1 fL (ref 80.0–100.0)
Platelets: 419 10*3/uL — ABNORMAL HIGH (ref 150–400)
RBC: 3.56 MIL/uL — ABNORMAL LOW (ref 3.87–5.11)
RDW: 17.2 % — ABNORMAL HIGH (ref 11.5–15.5)
WBC: 7.7 10*3/uL (ref 4.0–10.5)
nRBC: 0 % (ref 0.0–0.2)

## 2020-06-23 LAB — GLUCOSE, CAPILLARY: Glucose-Capillary: 143 mg/dL — ABNORMAL HIGH (ref 70–99)

## 2020-06-23 MED ORDER — FERROUS SULFATE 325 (65 FE) MG PO TABS
325.0000 mg | ORAL_TABLET | ORAL | Status: DC
  Administered 2020-06-23 – 2020-06-25 (×2): 325 mg via ORAL
  Filled 2020-06-23 (×2): qty 1

## 2020-06-23 NOTE — Progress Notes (Signed)
Physical Therapy Treatment Patient Details Name: [name] White MRN: [id number] DOB: 03-08-1956 Today's Date: 06/23/2020    History of Present Illness 64 y.o. female with PMH significant for OHS OSA, HFpEF, HTN, paroxysmal A. fib on Eliquis, T2DM, MDD, GERD, osteoarthritis, morbid obesity, remote tobacco use. Pt was at SNF and became SOB and hypoxic. Pt with frequent readmissions for respiratory failure.    PT Comments    Pt progressing towards her physical therapy goals; motivated to participate today. Able to perform seated warm up exercises for BLE's and ambulate 16 feet with a walker at a min guard assist level. SpO2 94-95% on 6L O2 via HFNC, HR peak 120 bpm. Continue to recommend SNF for ongoing Physical Therapy.      Follow Up Recommendations  SNF;Supervision/Assistance - 24 hour     Equipment Recommendations  None recommended by PT    Recommendations for Other Services       Precautions / Restrictions Precautions Precautions: Fall Restrictions Weight Bearing Restrictions: No    Mobility  Bed Mobility               General bed mobility comments: in chair when PT arrived  Transfers Overall transfer level: Needs assistance Equipment used: Rolling walker (2 wheeled) Transfers: Sit to/from Stand Sit to Stand: Min assist         General transfer comment: MinA to rise from recliner surface to stand  Ambulation/Gait Ambulation/Gait assistance: Min guard Gait Distance (Feet): 16 Feet Assistive device: Rolling walker (2 wheeled) Gait Pattern/deviations: Step-through pattern;Step-to pattern;Wide base of support;Trunk flexed;Shuffle Gait velocity: reduced Gait velocity interpretation: <1.8 ft/sec, indicate of risk for recurrent falls General Gait Details: Decreased bilateral foot clearance, increased trunk flexion, min guard for safety. Fatigues easily   Social research officer, government Rank[employer] (Stroke Patients Only)        Balance Overall balance assessment: Needs assistance Sitting-balance support: Feet supported Sitting balance-Leahy Scale: Good     Standing balance support: Bilateral upper extremity supported;During functional activity Standing balance-Leahy Scale: Poor                              Cognition Arousal/Alertness: Awake/alert Behavior During Therapy: WFL for tasks assessed/performed Overall Cognitive Status: Within Functional Limits for tasks assessed                                        Exercises General Exercises - Lower Extremity Long Arc Quad: Both;10 reps;Seated Heel Raises: Both;10 reps;Seated    General Comments        Pertinent Vitals/Pain Pain Assessment: Faces Faces Pain Scale: No hurt    Home Living                      Prior Function            PT Goals (current goals can now be found in the care plan section) Acute Rehab PT Goals Patient Stated Goal: walk more Potential to Achieve Goals: Good Progress towards PT goals: Progressing toward goals    Frequency    Min 2X/week      PT Plan Frequency needs to be updated    Co-evaluation              AM-PAC PT "6  Clicks" Mobility   Outcome Measure  Help needed turning from your back to your side while in a flat bed without using bedrails?: A Little Help needed moving from lying on your back to sitting on the side of a flat bed without using bedrails?: A Little Help needed moving to and from a bed to a chair (including a wheelchair)?: A Little Help needed standing up from a chair using your arms (e.g., wheelchair or bedside chair)?: A Little Help needed to walk in hospital room?: A Little Help needed climbing 3-5 steps with a railing? : A Lot 6 Click Score: 17    End of Session Equipment Utilized During Treatment: Oxygen Activity Tolerance: Patient tolerated treatment well;Patient limited by fatigue Patient left: in chair;with call bell/phone within  reach Nurse Communication: Mobility status PT Visit Diagnosis: Other abnormalities of gait and mobility (R26.89);Muscle weakness (generalized) (M62.81)     Time: [id number] PT Time Calculation (min) (ACUTE ONLY): 28 min  Charges:  $Therapeutic Activity: 23-37 mins                     Wyona Almas, PT, DPT Acute Rehabilitation Services Pager [phone] Office [phone]    Deno Etienne 06/23/2020, 4:53 PM

## 2020-06-23 NOTE — Progress Notes (Addendum)
Family Medicine Teaching Service Daily Progress Note Intern Pager: [phone]  Patient name: [name] White Medical record number: [id number] Date of birth: 12-12-1955 Age: 64 y.o. Gender: female  Primary Care Provider: Leeroy Bock, DO Consultants: None Code Status: DNR  Pt Overview and Major Events to Date:  12/18: Admitted  12/24: Noted drainage of pilonidal cyst  Assessment and Plan: [name] White a 64 y.o.femalewithPMH significant forOHS OSA, HFpEF, HTN, paroxysmal A. fib on Eliquis, T2DM,MDD, GERD, osteoarthritis, morbid obesity,remote tobacco use with 20-pack-year cigarette history.  Acute on Chronic Hypoxic/Hypercapneic Respiratory failure OH Sats 100% on 6L which is her home oxygen. Patient is tolerating BiPAP overnight. Used BiPAP throughout the night without any concerns. Patient is medically stable and ready for discharge to SNF which allows use of her Trilogy machine. - F/u CSW: no bed available as of 12/27 [facility] in [place]  - BiPAP(trilogy)nightlyand anytime while sleeping - Continue home breathing treatments - PT/OT eval and treat - TOCreferral  Pilonidal cyst, unchanged, chronic: Patient working with physical therapy, deferred check to later. Poor surgical candidate due to respiratory status and blood thinner. Has been treated with topical bacitracin in the past.  - bacitracin topical BID - lidocaine/hydrocortizone at anus for pain - miralax daily  Iron deficiency anemia-Chronic, stable Hemoglobin 10.9 on admission >12.9 > 9.6 > 9.5>9.5.   -Iron panel including ferritin wnl -started on oral iron supplementation 325 mg qod, likely continue on discharge -Recommend GI outpatient referral to [facility] for colonoscopy and possible EGD for further investigation into potential etiology of normocytic anemia  Paroxysmal A. fib on Eliquis  AV block noted on EKG Initial concern given that EKG 12/18 demonstrated second-degree AV  block.  Repeat EKG 12/27 demonstrated PR interval upper end of normal at 200 ms.  However, very low suspicion for cardiac etiology, will follow up EKG in 1-2 days.  -Continue home meds: flecainide 100mg  BID, Cardizem 120mg  daily, metoprolol 12.5mg  BID , Eliquis 5mg  BID   Deconditioning Patient is making excellent progress with PT and continues to work toward independent walking - continue PT - TOC and CSW, continue to appreciate assistance with placement   HFpEFHypertension - Continue home meds: Continueatorvastatin 40mg  daily, Cardizem 120mg  daily, metoprolol 12.5mg  BID. Continue lasix 60 mg daily.  Type 2 diabetes mellitus-chronic, stable.diet controlled. CBG 143 today -Continue to monitor with daily CBG   Thrombocytosis, chronic, improved Platelets 563 on admission, currently mildly elevated at 419.    Rhinorrhea - chronic, improved -Atroventnasal spray PRN  MDD: Chronic, stable -Continue Home meds: Venlafaxine 187.5 mg daily  GERD: Chronic, stable Home meds:omeprazole 20 mg daily. -Protonix formulary  Osteoarthritis: Chronic, stable -Continue home med: gabapentin 200 mg 3 times daily.  FEN/GI: heart healthy diet  PPx: Eliquis   Status is: Inpatient  Remains inpatient appropriate because:awaiting SNF placement   Dispo: The patient is from: SNF              Anticipated d/c is to: SNF              Anticipated d/c date is: 1 day              Patient currently is medically stable to d/c.        Subjective:  Patient doing well, no acute events overnight.   Objective: Temp:  [97.8 F (36.6 C)-98.6 F (37 C)] 97.8 F (36.6 C) (12/28 0516) Pulse Rate:  [80-115] 91 (12/28 0516) Resp:  [18-22] 20 (12/28 0516) BP: (103-136)/(54-76)  136/63 (12/28 0516) SpO2:  [94 %-100 %] 100 % (12/28 0516) Weight:  [163.2 kg] 163.2 kg (12/28 0131) Physical Exam: General: Patient walking with assistance of walker and PT, in no acute distress. Cardiovascular:  RRR, no murmurs or gallops auscultated  Respiratory: lungs clear to auscultation bilaterally, breathing comfortably on 6 L without respiratory distress Abdomen: soft, nontender, presence of bowel sounds  Extremities: 1+ pitting edema on LLE, radial and distal pulses strong and equal bilaterally Neuro: walking with assistance of walker, appropriately conversational  Psych: very pleasant  Laboratory: Recent Labs  Lab 06/21/20 0404 06/22/20 0333 06/23/20 0341  WBC 8.2 8.2 7.7  HGB 9.6* 9.5* 9.5*  HCT 35.1* 34.3* 33.5*  PLT 400 386 419*   Recent Labs  Lab 06/21/20 0404  NA 140  K 3.8  CL 91*  CO2 40*  BUN 9  CREATININE 0.47  CALCIUM 8.8*  GLUCOSE 127*      Imaging/Diagnostic Tests: No results found.  Reece Leader, DO 06/23/2020, 6:58 AM PGY-1, [facility] Family Medicine FPTS Intern pager: [phone], text pages welcome

## 2020-06-23 NOTE — Progress Notes (Signed)
  Mobility Specialist Criteria Algorithm Info.  SATURATION QUALIFICATIONS: (This note is used to comply with regulatory documentation for home oxygen)  Patient Saturations on Room Air at Rest = n/a%  Patient Saturations on Room Air while Ambulating = n/a%  Patient Saturations on 6 Liters of oxygen while Ambulating = 95%  Please briefly explain why patient needs home oxygen:  Mobility Team:  [facility] elevated:HOB 30 Activity: Ambulated in room; Transferred:  Bed to chair (To chair after ambulation) Range of motion: Active; All extremities Level of assistance: Contact guard assist, steadying assist Assistive device: Front Wheel Walker Minutes sitting in chair:  Minutes stood: 3 minutes Minutes ambulated: 3 minutes Distance ambulated (ft): 25 ft Mobility response: Tolerated well Bed Position: Chair ([place] chair)  Pt very pleasant and eager to participate in mobility this morning. She got to the EOB supine/sit independently and stood with supervision from elevated surface and RW. Patient is weak and deconditioned. Ambulated in room using RW to the door then to recliner chair with WBOS, very slow gait. Tolerated ambulation well and tolerated ambulating 6L O2 well, saturating 95% throughout. She is now sitting in recliner chair with all needs met.   06/23/2020 9:58 AM

## 2020-06-23 NOTE — Progress Notes (Signed)
[facility] cannot accept pt on CPAP, BIPAP, or trilogy.   [facility] cannot accept pt due to no barriatric beds.   CSW called Revonda Standard at [facility] who will review for possible Trilogy at [facility] facility or bipap/cpap in [place].

## 2020-06-24 DIAGNOSIS — Z789 Other specified health status: Secondary | ICD-10-CM

## 2020-06-24 DIAGNOSIS — D649 Anemia, unspecified: Secondary | ICD-10-CM | POA: Diagnosis not present

## 2020-06-24 LAB — GLUCOSE, CAPILLARY: Glucose-Capillary: 162 mg/dL — ABNORMAL HIGH (ref 70–99)

## 2020-06-24 MED ORDER — GUAIFENESIN 100 MG/5ML PO SOLN
5.0000 mL | Freq: Two times a day (BID) | ORAL | Status: DC | PRN
  Administered 2020-06-25 – 2020-06-26 (×2): 100 mg via ORAL
  Filled 2020-06-24 (×2): qty 5

## 2020-06-24 NOTE — Progress Notes (Signed)
Occupational Therapy Treatment Patient Details Name: [name] White MRN: [id number] DOB: [birth year] Today's Date: 06/24/2020    History of present illness 64 y.o. female with PMH significant for OHS OSA, HFpEF, HTN, paroxysmal A. fib on Eliquis, T2DM, MDD, GERD, osteoarthritis, morbid obesity, remote tobacco use. Pt was at SNF and became SOB and hypoxic. Pt with frequent readmissions for respiratory failure.   OT comments  Pt reporting feeling SOB, Sp02 at 98% on 6L with HR 90. Asking for breathing tx, unit secretary notified. Pt performed seated grooming and stood with RW and marched in place. Pt encouraged that she has a rehab bed available at SNF. Remains motivated to regain strength and endurance and return home.   Follow Up Recommendations  SNF    Equipment Recommendations  None recommended by OT    Recommendations for Other Services      Precautions / Restrictions Precautions Precautions: Fall Restrictions Weight Bearing Restrictions: No       Mobility Bed Mobility               General bed mobility comments: pt in recliner  Transfers Overall transfer level: Needs assistance Equipment used: Rolling walker (2 wheeled) Transfers: Sit to/from Stand Sit to Stand: Min assist         General transfer comment: min assist to rise from recliner    Balance Overall balance assessment: Needs assistance   Sitting balance-Leahy Scale: Good     Standing balance support: Bilateral upper extremity supported;During functional activity Standing balance-Leahy Scale: Poor Standing balance comment: reliant on UE support of RW                           ADL either performed or assessed with clinical judgement   ADL Overall ADL's : Needs assistance/impaired     Grooming: Wash/dry hands;Wash/dry face;Sitting Grooming Details (indicate cue type and reason): in chair                                     Vision       Perception      Praxis      Cognition Arousal/Alertness: Awake/alert Behavior During Therapy: WFL for tasks assessed/performed Overall Cognitive Status: Within Functional Limits for tasks assessed                                 General Comments: pt had awakened from sleeping in chair, reports feeling SOB, assessed Sp02 98% on 6L, pt requesting breathing tx, secretary notified        Exercises     Shoulder Instructions       General Comments      Pertinent Vitals/ Pain       Pain Assessment: No/denies pain  Home Living                                          Prior Functioning/Environment              Frequency  Min 2X/week        Progress Toward Goals  OT Goals(current goals can now be found in the care plan section)  Progress towards OT goals: Progressing toward goals  Acute Rehab OT Goals Patient Stated  Goal: breathe better OT Goal Formulation: With patient Time For Goal Achievement: 06/29/20 Potential to Achieve Goals: Good  Plan Discharge plan remains appropriate    Co-evaluation                 AM-PAC OT "6 Clicks" Daily Activity     Outcome Measure   Help from another person eating meals?: None Help from another person taking care of personal grooming?: A Little Help from another person toileting, which includes using toliet, bedpan, or urinal?: A Little Help from another person bathing (including washing, rinsing, drying)?: A Lot Help from another person to put on and taking off regular upper body clothing?: A Little Help from another person to put on and taking off regular lower body clothing?: A Lot 6 Click Score: 17    End of Session Equipment Utilized During Treatment: Oxygen;Rolling walker  OT Visit Diagnosis: Unsteadiness on feet (R26.81);Other abnormalities of gait and mobility (R26.89);Muscle weakness (generalized) (M62.81);Other (comment)   Activity Tolerance Patient tolerated treatment well   Patient  Left in chair;with call bell/phone within reach   Nurse Communication Other (comment) (NT made aware purewick container full, secretary notified pt requesting breathing tx)        Time: 1420-1436 OT Time Calculation (min): 16 min  Charges: OT General Charges $OT Visit: 1 Visit OT Treatments $Therapeutic Activity: 8-22 mins  Martie Round, OTR/L Acute Rehabilitation Services Pager: [phone] Office: [phone]   Evern Bio 06/24/2020, 2:37 PM

## 2020-06-24 NOTE — Progress Notes (Addendum)
CSW received notification that [facility] can accept pt. They need bipap settings and will order a bipap. CSW confirmed plan with pt. She is okay with this plan. CSW will coordinate with [facility]. Transition to SNF once bipap is delivered and insurance auth approved. CSW sent BIPAP settings.   1447: CSW confirmed with [facility] that Berkley Harvey has been started and BIPAP ordered.

## 2020-06-24 NOTE — Progress Notes (Signed)
Family Medicine Teaching Service Daily Progress Note Intern Pager: [phone]  Patient name: [name] White Medical record number: [id number] Date of birth: [birth year] Age: 64 y.o. Gender: female  Primary Care Provider: Leeroy Bock, DO Consultants: None Code Status: DNR  Pt Overview and Major Events to Date:  12/18: Admitted  12/24: Noted drainage of pilonidal cyst  Assessment and Plan: [name] White a 64 y.o.femalewithPMH significant forOHS OSA, HFpEF, HTN, paroxysmal A. fib on Eliquis, T2DM,MDD, GERD, osteoarthritis, morbid obesity,remote tobacco use with 20-pack-year cigarette history.  Acute on Chronic Hypoxic/Hypercapneic Respiratory failure [place] O2 sat 100% on 6L, which is patient's home oxygen. Patient is tolerating BiPAP overnight. BiPAP overnight without concerns. Patient is medically stable and ready for discharge to SNF which allows use of her Trilogy machine. - F/u CSW - BiPAP(trilogy)nightlyand anytime while sleeping - Continue home breathing treatments - PT/OT eval and treat - TOCreferral  Gluteal cyst, unchanged, chronic: Picture in chart, improved and minimal drainage since 12/25 per nursing.   - bacitracin topical BID - lidocaine/hydrocortizone at anus for pain - miralax daily  Iron deficiency anemia-Chronic, stable Hgb trend 10.9>12.9 > 9.6 > 9.5>9.5 (12/28). Iron panel wnl   - Continue iron supplementation 325 mg qod  Paroxysmal A. fib on Eliquis  AV block noted on EKG EKG 12/18 demonstrated second-degree AV block. Repeat EKG 12/27 demonstrated PR interval upper end of normal at 200 ms. - F/u repeat EKG - Continue home meds: flecainide 100mg  BID, Cardizem 120mg  daily, metoprolol 12.5mg  BID , Eliquis 5mg  BID   Deconditioning - Continue PT - TOC and CSW, continue to appreciate assistance with placement   HFpEFHypertension - Continue home meds: Continueatorvastatin 40mg  daily, Cardizem 120mg  daily, metoprolol 12.5mg   BID. Continue lasix 60 mg daily, and KCL daily.  Type 2 diabetes mellitus Chronic, stable.diet controlled. CBG 162 today - Continue to monitor with daily CBG  - Continue home gabapentin 200mg  TID  Thrombocytosis, chronic, improved Plts on 12/28 419   - Continue to monitor with CBC  Rhinorrhea - chronic, improved - Atroventnasal spray PRN, fluticasone daily  MDD: Chronic, stable - Continue Home meds: Venlafaxine 187.5 mg daily  GERD: Chronic, stable Home meds:omeprazole 20 mg daily. -Protonix formulary  Osteoarthritis: Chronic, stable -Continue home med: gabapentin 200 mg 3 times daily.  FEN/GI: heart healthy diet  PPx: Eliquis   Status is: Inpatient  Remains inpatient appropriate because:awaiting SNF placement   Dispo: The patient is from: SNF              Anticipated d/c is to: SNF              Anticipated d/c date is: 1 day              Patient currently is medically stable to d/c.   Subjective:  Patient has no complaints today but did notice that her legs had some tenderness to palpation. Nursing states that the gluteal cyst has had minimal drainage since Saturday (12/25).   Objective: Temp:  [97.8 F (36.6 C)-98.3 F (36.8 C)] 97.8 F (36.6 C) (12/29 0409) Pulse Rate:  [73-88] 73 (12/29 0409) Resp:  [18] 18 (12/29 0409) BP: (112-115)/(65-86) 112/86 (12/29 0409) SpO2:  [6 %-100 %] 100 % (12/29 0426) Weight:  [164.2 kg] 164.2 kg (12/29 0409) Physical Exam: General: NAD, sitting up in bed with BiPAP on Cardiovascular: RRR no m/r/g appreciated Respiratory: CTAB, BiPAP machine in place, normal WOB Abdomen: soft, non-tender, non-distended Extremities: 1+ edema of bilateral lower extremities, reported  tenderness to palpation on anterior lower extremities below the knee Psych: very pleasant  Laboratory: Recent Labs  Lab 06/21/20 0404 06/22/20 0333 06/23/20 0341  WBC 8.2 8.2 7.7  HGB 9.6* 9.5* 9.5*  HCT 35.1* 34.3* 33.5*  PLT 400 386  419*   Recent Labs  Lab 06/21/20 0404  NA 140  K 3.8  CL 91*  CO2 40*  BUN 9  CREATININE 0.47  CALCIUM 8.8*  GLUCOSE 127*      Imaging/Diagnostic Tests: No results found.  [name] Patience, DO 06/24/2020, 6:03 AM PGY-1, [place] Intern pager: [phone], text pages welcome

## 2020-06-24 NOTE — Plan of Care (Signed)
°  Problem: Education: Goal: Ability to verbalize understanding of medication therapies will improve Outcome: Progressing   Problem: Activity: Goal: Capacity to carry out activities will improve Outcome: Progressing   Problem: Cardiac: Goal: Ability to achieve and maintain adequate cardiopulmonary perfusion will improve Outcome: Progressing   Problem: Education: Goal: Knowledge of General Education information will improve Description: Including pain rating scale, medication(s)/side effects and non-pharmacologic comfort measures Outcome: Progressing   

## 2020-06-25 DIAGNOSIS — D649 Anemia, unspecified: Secondary | ICD-10-CM | POA: Diagnosis not present

## 2020-06-25 DIAGNOSIS — J9622 Acute and chronic respiratory failure with hypercapnia: Secondary | ICD-10-CM | POA: Diagnosis not present

## 2020-06-25 LAB — GLUCOSE, CAPILLARY: Glucose-Capillary: 120 mg/dL — ABNORMAL HIGH (ref 70–99)

## 2020-06-25 LAB — SARS CORONAVIRUS 2 (TAT 6-24 HRS): SARS Coronavirus 2: NEGATIVE

## 2020-06-25 NOTE — Progress Notes (Signed)
Patient has been stable and maintained on regular BiPAP. Patient has not required a Trilogy machine since admission and is doing well. Can be maintained on normal BiPAP upon discharge. For any questions or concerns, please contact our team at [phone].  Elany Felix, DO

## 2020-06-25 NOTE — Progress Notes (Signed)
   06/25/20 1236  Assess: MEWS Score  Temp 98 F (36.7 C)  BP 121/64  Pulse Rate 98  Resp 20  SpO2 99 %  O2 Device Nasal Cannula  O2 Flow Rate (L/min) 7 L/min  Assess: MEWS Score  MEWS Temp 0  MEWS Systolic 0  MEWS Pulse 0  MEWS RR 0  MEWS LOC 0  MEWS Score 0  MEWS Score Color Green  Assess: if the MEWS score is Yellow or Red  Were vital signs taken at a resting state? Yes  Focused Assessment No change from prior assessment  Early Detection of Sepsis Score *See Row Information* Low  MEWS guidelines implemented *See Row Information* No, vital signs rechecked

## 2020-06-25 NOTE — Progress Notes (Signed)
I agree with previous charting from this shift  

## 2020-06-25 NOTE — Progress Notes (Signed)
Patient has two creams scheduled this morning. Patient stated she wants them after she receives bath. NT aware, patient stated she will call. Moved creams for 1500 .

## 2020-06-25 NOTE — Plan of Care (Signed)
  Problem: Education: Goal: Ability to demonstrate management of disease process will improve Outcome: Progressing   Problem: Activity: Goal: Capacity to carry out activities will improve Outcome: Progressing   Problem: Education: Goal: Knowledge of General Education information will improve Description: Including pain rating scale, medication(s)/side effects and non-pharmacologic comfort measures Outcome: Progressing   Problem: Clinical Measurements: Goal: Cardiovascular complication will be avoided Outcome: Progressing   Problem: Activity: Goal: Risk for activity intolerance will decrease Outcome: Progressing

## 2020-06-25 NOTE — TOC Progression Note (Signed)
Transition of Care [facility]) - Progression Note    Patient Details  Name: [name] White MRN: [id number] Date of Birth: 1955/07/26  Transition of Care [facility]) CM/SW Contact  Erin Sons, [place] Phone Number: 06/25/2020, 3:51 PM  Clinical Narrative:     CSW notified that [facility] is approved. SNF requesting BIPAP settings vs CPAP settings. CSW provides settings:   IPAP Max 30 min 8 EPAP 6  DC in AM Expected Discharge Plan: Skilled Nursing Facility Barriers to Discharge: Continued Medical Work up,No SNF bed  Expected Discharge Plan and Services Expected Discharge Plan: Skilled Nursing Facility                                               Social Determinants of Health (SDOH) Interventions    Readmission Risk Interventions Readmission Risk Prevention Plan 02/07/2020 07/22/2019 03/08/2019  Transportation Screening Complete Complete Complete  Medication Review Oceanographer) Complete Complete Complete  PCP or Specialist appointment within 3-5 days of discharge Complete Complete Complete  HRI or Home Care Consult Complete Complete Complete  SW Recovery Care/Counseling Consult Complete Complete Complete  Palliative Care Screening Complete Not Applicable Not Applicable  Skilled Nursing Facility Complete Not Applicable Not Applicable  Some recent data might be hidden

## 2020-06-25 NOTE — Progress Notes (Signed)
Family Medicine Teaching Service Daily Progress Note Intern Pager: [phone]  Patient name: [name] White Medical record number: [id number] Date of birth: 01-30-1956 Age: 64 y.o. Gender: female  Primary Care Provider: Richarda Osmond, DO Consultants: None Code Status: DNR  Pt Overview and Major Events to Date:  12/18: Admitted  12/24: Noted drainage of pilonidal cyst  Assessment and Plan: MAVA BIBER a 64 y.o.femalewithPMH significant forOHS OSA, HFpEF, HTN, paroxysmal A. fib on Eliquis, T2DM,MDD, GERD, osteoarthritis, morbid obesity,remote tobacco use with 20-pack-year cigarette history.  Acute on Chronic Hypoxic/Hypercapneic Respiratory failure [place] O2 sat 100% on 6L, which is patient's home oxygen. Patient is tolerating BiPAP overnight. Patient is medically stable and ready for discharge to SNF which allows use of her Trilogy machine. - F/u CSW - BiPAP, able to be discharged on regular BiPAP and not Trilogy - Continue home breathing treatments - PT/OT  - TOCreferral  Gluteal cyst, unchanged, chronic: Picture in chart, improved and minimal drainage since 12/25 per nursing.   - bacitracin topical BID - lidocaine/hydrocortizone at anus for pain - miralax daily  Iron deficiency anemia-Chronic, stable Hgb stable at 9.5 on 12/28. Iron panel wnl   - Continue iron supplementation 325 mg qod  Paroxysmal A. fib on Eliquis  AV block noted on EKG EKG 12/19 demonstrated second-degree AV block. Repeat EKG 12/27 demonstrated PR interval upper end of normal at 200 ms. No AV block noted on EKG 12/29  - Continue home meds: flecainide 100mg  BID, Cardizem 120mg  daily, metoprolol 12.5mg  BID , Eliquis 5mg  BID   Deconditioning - Continue PT - TOC and CSW, continue to appreciate assistance with placement   HFpEFHypertension - Continue home meds: Continueatorvastatin 40mg  daily, Cardizem 120mg  daily, metoprolol 12.5mg  BID. Continue lasix 60 mg daily, and KCL  80mEq daily.  Type 2 diabetes mellitus Chronic, stable.diet controlled. CBG 120 today - Continue to monitor with daily CBG  - Continue home gabapentin 200mg  TID  Thrombocytosis, chronic, stable Plts on 12/28 419   - Continue to monitor with CBC  Rhinorrhea - chronic, improved - Atroventnasal spray PRN, fluticasone daily  MDD: Chronic, stable - Continue Home meds: Venlafaxine 187.5 mg daily  GERD: Chronic, stable Home meds:omeprazole 20 mg daily. -Protonix formulary  Osteoarthritis: Chronic, stable -Continue home med: gabapentin 200 mg 3 times daily.  FEN/GI: heart healthy diet  PPx: Eliquis   Status is: Inpatient  Remains inpatient appropriate because:awaiting SNF placement   Dispo: The patient is from: SNF              Anticipated d/c is to: SNF              Anticipated d/c date is: 1 day              Patient currently is medically stable to d/c.   Subjective:  Patient has BiPAP on as she reports that she feels she needs it more when the weather turns rainy. Otherwise no complaints.    Objective: Temp:  [97.8 F (36.6 C)-98 F (36.7 C)] 97.8 F (36.6 C) (12/30 0312) Pulse Rate:  [67-102] 67 (12/30 0312) Resp:  [17-18] 18 (12/30 0312) BP: (112-142)/(55-83) 112/55 (12/30 0312) SpO2:  [98 %-100 %] 100 % (12/30 [id number]) Physical Exam: General: NAD, sitting up in bed with BiPAP on Cardiovascular: RRR no m/r/g appreciated Respiratory: CTAB, BiPAP machine in place, normal WOB Abdomen: soft, non-tender, non-distended Extremities: trace pitting edema of b/l LE Psych: very pleasant  Laboratory: Recent Labs  Lab 06/21/20 0404 06/22/20  4742 06/23/20 0341  WBC 8.2 8.2 7.7  HGB 9.6* 9.5* 9.5*  HCT 35.1* 34.3* 33.5*  PLT 400 386 419*   Recent Labs  Lab 06/21/20 0404  NA 140  K 3.8  CL 91*  CO2 40*  BUN 9  CREATININE 0.47  CALCIUM 8.8*  GLUCOSE 127*      Imaging/Diagnostic Tests: No results found.  Evelena Leyden, DO 06/25/2020, 6:22  AM PGY-1, [facility] Family Medicine FPTS Intern pager: [phone], text pages welcome

## 2020-06-26 DIAGNOSIS — M62838 Other muscle spasm: Secondary | ICD-10-CM | POA: Diagnosis not present

## 2020-06-26 DIAGNOSIS — D500 Iron deficiency anemia secondary to blood loss (chronic): Secondary | ICD-10-CM | POA: Diagnosis not present

## 2020-06-26 DIAGNOSIS — J9621 Acute and chronic respiratory failure with hypoxia: Secondary | ICD-10-CM | POA: Diagnosis not present

## 2020-06-26 DIAGNOSIS — S31809A Unspecified open wound of unspecified buttock, initial encounter: Secondary | ICD-10-CM | POA: Diagnosis not present

## 2020-06-26 DIAGNOSIS — I480 Paroxysmal atrial fibrillation: Secondary | ICD-10-CM | POA: Diagnosis not present

## 2020-06-26 DIAGNOSIS — M79651 Pain in right thigh: Secondary | ICD-10-CM | POA: Diagnosis not present

## 2020-06-26 DIAGNOSIS — J9611 Chronic respiratory failure with hypoxia: Secondary | ICD-10-CM | POA: Diagnosis not present

## 2020-06-26 DIAGNOSIS — I4891 Unspecified atrial fibrillation: Secondary | ICD-10-CM | POA: Diagnosis not present

## 2020-06-26 DIAGNOSIS — E662 Morbid (severe) obesity with alveolar hypoventilation: Secondary | ICD-10-CM | POA: Diagnosis not present

## 2020-06-26 DIAGNOSIS — B351 Tinea unguium: Secondary | ICD-10-CM | POA: Diagnosis not present

## 2020-06-26 DIAGNOSIS — R262 Difficulty in walking, not elsewhere classified: Secondary | ICD-10-CM | POA: Diagnosis not present

## 2020-06-26 DIAGNOSIS — R601 Generalized edema: Secondary | ICD-10-CM | POA: Diagnosis not present

## 2020-06-26 DIAGNOSIS — E119 Type 2 diabetes mellitus without complications: Secondary | ICD-10-CM | POA: Diagnosis not present

## 2020-06-26 DIAGNOSIS — M1990 Unspecified osteoarthritis, unspecified site: Secondary | ICD-10-CM | POA: Diagnosis not present

## 2020-06-26 DIAGNOSIS — J9600 Acute respiratory failure, unspecified whether with hypoxia or hypercapnia: Secondary | ICD-10-CM | POA: Diagnosis not present

## 2020-06-26 DIAGNOSIS — R600 Localized edema: Secondary | ICD-10-CM | POA: Diagnosis not present

## 2020-06-26 DIAGNOSIS — D519 Vitamin B12 deficiency anemia, unspecified: Secondary | ICD-10-CM | POA: Diagnosis not present

## 2020-06-26 DIAGNOSIS — D508 Other iron deficiency anemias: Secondary | ICD-10-CM | POA: Diagnosis not present

## 2020-06-26 DIAGNOSIS — R42 Dizziness and giddiness: Secondary | ICD-10-CM | POA: Diagnosis not present

## 2020-06-26 DIAGNOSIS — J3489 Other specified disorders of nose and nasal sinuses: Secondary | ICD-10-CM | POA: Diagnosis not present

## 2020-06-26 DIAGNOSIS — Z7401 Bed confinement status: Secondary | ICD-10-CM | POA: Diagnosis not present

## 2020-06-26 DIAGNOSIS — M2550 Pain in unspecified joint: Secondary | ICD-10-CM | POA: Diagnosis not present

## 2020-06-26 DIAGNOSIS — D649 Anemia, unspecified: Secondary | ICD-10-CM | POA: Diagnosis not present

## 2020-06-26 DIAGNOSIS — H2513 Age-related nuclear cataract, bilateral: Secondary | ICD-10-CM | POA: Diagnosis not present

## 2020-06-26 DIAGNOSIS — M79661 Pain in right lower leg: Secondary | ICD-10-CM | POA: Diagnosis not present

## 2020-06-26 DIAGNOSIS — E1159 Type 2 diabetes mellitus with other circulatory complications: Secondary | ICD-10-CM | POA: Diagnosis not present

## 2020-06-26 DIAGNOSIS — G4700 Insomnia, unspecified: Secondary | ICD-10-CM | POA: Diagnosis not present

## 2020-06-26 DIAGNOSIS — R0602 Shortness of breath: Secondary | ICD-10-CM | POA: Diagnosis not present

## 2020-06-26 DIAGNOSIS — J9622 Acute and chronic respiratory failure with hypercapnia: Secondary | ICD-10-CM | POA: Diagnosis not present

## 2020-06-26 DIAGNOSIS — M6281 Muscle weakness (generalized): Secondary | ICD-10-CM | POA: Diagnosis not present

## 2020-06-26 DIAGNOSIS — I5030 Unspecified diastolic (congestive) heart failure: Secondary | ICD-10-CM | POA: Diagnosis not present

## 2020-06-26 DIAGNOSIS — F339 Major depressive disorder, recurrent, unspecified: Secondary | ICD-10-CM | POA: Diagnosis not present

## 2020-06-26 DIAGNOSIS — H04123 Dry eye syndrome of bilateral lacrimal glands: Secondary | ICD-10-CM | POA: Diagnosis not present

## 2020-06-26 DIAGNOSIS — H524 Presbyopia: Secondary | ICD-10-CM | POA: Diagnosis not present

## 2020-06-26 DIAGNOSIS — R5381 Other malaise: Secondary | ICD-10-CM | POA: Diagnosis not present

## 2020-06-26 DIAGNOSIS — H1045 Other chronic allergic conjunctivitis: Secondary | ICD-10-CM | POA: Diagnosis not present

## 2020-06-26 DIAGNOSIS — J449 Chronic obstructive pulmonary disease, unspecified: Secondary | ICD-10-CM | POA: Diagnosis not present

## 2020-06-26 DIAGNOSIS — F411 Generalized anxiety disorder: Secondary | ICD-10-CM | POA: Diagnosis not present

## 2020-06-26 DIAGNOSIS — Z789 Other specified health status: Secondary | ICD-10-CM | POA: Diagnosis not present

## 2020-06-26 DIAGNOSIS — L0292 Furuncle, unspecified: Secondary | ICD-10-CM | POA: Diagnosis not present

## 2020-06-26 DIAGNOSIS — K219 Gastro-esophageal reflux disease without esophagitis: Secondary | ICD-10-CM | POA: Diagnosis not present

## 2020-06-26 DIAGNOSIS — I10 Essential (primary) hypertension: Secondary | ICD-10-CM | POA: Diagnosis not present

## 2020-06-26 DIAGNOSIS — F320 Major depressive disorder, single episode, mild: Secondary | ICD-10-CM | POA: Diagnosis not present

## 2020-06-26 DIAGNOSIS — J9601 Acute respiratory failure with hypoxia: Secondary | ICD-10-CM | POA: Diagnosis not present

## 2020-06-26 LAB — GLUCOSE, CAPILLARY: Glucose-Capillary: 110 mg/dL — ABNORMAL HIGH (ref 70–99)

## 2020-06-26 MED ORDER — FERROUS SULFATE 325 (65 FE) MG PO TABS: 325.0000 mg | ORAL_TABLET | ORAL | 3 refills | Status: AC

## 2020-06-26 MED ORDER — BACITRACIN ZINC 500 UNIT/GM EX OINT: TOPICAL_OINTMENT | Freq: Two times a day (BID) | CUTANEOUS | 0 refills | Status: AC

## 2020-06-26 MED ORDER — GUAIFENESIN 100 MG/5ML PO SOLN: 5.0000 mL | Freq: Two times a day (BID) | ORAL | 0 refills | Status: AC | PRN

## 2020-06-26 MED ORDER — HYDROCORTISONE 1 % EX CREA: TOPICAL_CREAM | Freq: Two times a day (BID) | CUTANEOUS | 0 refills | Status: AC

## 2020-06-26 NOTE — Progress Notes (Signed)
FMTS Brief Note  Patient admitted for acute on chronic hypoxemic hypercarbic respiratory failure as she did not have access to BiPap at SNF.   She is discharging to [facility] today--- confirmed BiPap available.  Terisa Starr, MD  Family Medicine Teaching Service

## 2020-06-26 NOTE — TOC Transition Note (Signed)
Transition of Care [facility]) - CM/SW Discharge Note   Patient Details  Name: [name] White MRN: [id number] Date of Birth: 17-Jan-1956  Transition of Care [facility]) CM/SW Contact:  Erin Sons, LCSW Phone Number: 06/26/2020, 2:07 PM   Clinical Narrative:    Patient will DC to: [facility] Anticipated DC date: 06/26/20 Family notified: Pt to notify family Transport by: Sharin Mons   Per MD patient ready for DC to [facility] . RN, patient, patient's family, and facility notified of DC. Discharge Summary and FL2 sent to facility. RN to call report prior to discharge [phone] Room 208-2). DC packet on chart. Ambulance transport requested for patient.   CSW will sign off for now as social work intervention is no longer needed. Please consult [place] again if new needs arise.    Final next level of care: Skilled Nursing Facility Barriers to Discharge: No Barriers Identified   Patient Goals and CMS Choice Patient states their goals for this hospitalization and ongoing recovery are:: Continue to improve walking      Discharge Placement              Patient chooses bed at: [facility] Patient to be transferred to facility by: [facility] Name of family member notified: Pt to notify family Patient and family notified of of transfer: 06/26/20  Discharge Plan and Services                                     Social Determinants of Health (SDOH) Interventions     Readmission Risk Interventions Readmission Risk Prevention Plan 02/07/2020 07/22/2019 03/08/2019  Transportation Screening Complete Complete Complete  Medication Review Oceanographer) Complete Complete Complete  PCP or Specialist appointment within 3-5 days of discharge Complete Complete Complete  HRI or Home Care Consult Complete Complete Complete  SW Recovery Care/Counseling Consult Complete Complete Complete  Palliative Care Screening Complete Not Applicable Not Applicable  Skilled Nursing Facility  Complete Not Applicable Not Applicable  Some recent data might be hidden

## 2020-06-26 NOTE — Progress Notes (Signed)
Called facility to give report. Not able to get in touch with nurse. Pt dressed and ready. IV is d/c

## 2020-06-26 NOTE — Discharge Instructions (Signed)
Dear Wendi Snipes,   Thank you so much for allowing [place] to be part of your care!  You were admitted to [facility] for placement issues due to your skilled nursing facility not having your home Trilogy BiPAP.   POST-HOSPITAL & CARE INSTRUCTIONS 1. Monitor your right buttock wound daily. 2. Please let PCP/Specialists know of any changes that were made.  3. Please see medications section of this packet for any medication changes.   RETURN PRECAUTIONS: Please return if you develop severe shortness of breath.  Take care and be well!  Family Medicine Teaching Service  [place]  [facility]  [address] [place], [place] [address] [phone]

## 2020-06-26 NOTE — Progress Notes (Signed)
Report given to accepting SNF. Patient leaving with [facility] in no distress with belongings and discharge AVS in stretcher.

## 2020-06-26 NOTE — Progress Notes (Signed)
       RE:   [name] White      Date of Birth:  01-06-1956     Date:   06/26/2020       To Whom It May Concern:  Please be advised that the above-named patient will require a short-term nursing home stay - anticipated 30 days or less for rehabilitation and strengthening.  The plan is for return home.

## 2020-06-26 NOTE — Plan of Care (Signed)
  Problem: Education: Goal: Ability to demonstrate management of disease process will improve Outcome: Progressing Goal: Ability to verbalize understanding of medication therapies will improve Outcome: Progressing   Problem: Cardiac: Goal: Ability to achieve and maintain adequate cardiopulmonary perfusion will improve Outcome: Progressing   

## 2020-06-26 NOTE — Progress Notes (Signed)
Accepted pt from Jasmina, RN. Agree with previous assessment. 

## 2020-06-26 NOTE — Progress Notes (Signed)
Second attempt to reach nurse at facility. Not able to get in touch.

## 2020-06-26 NOTE — Progress Notes (Signed)
D/C instructions printed and placed in packet at nurse's station.  

## 2020-06-29 DIAGNOSIS — J449 Chronic obstructive pulmonary disease, unspecified: Secondary | ICD-10-CM | POA: Diagnosis not present

## 2020-06-30 DIAGNOSIS — I4891 Unspecified atrial fibrillation: Secondary | ICD-10-CM | POA: Diagnosis not present

## 2020-06-30 DIAGNOSIS — D508 Other iron deficiency anemias: Secondary | ICD-10-CM | POA: Diagnosis not present

## 2020-06-30 DIAGNOSIS — S31809A Unspecified open wound of unspecified buttock, initial encounter: Secondary | ICD-10-CM | POA: Diagnosis not present

## 2020-06-30 DIAGNOSIS — J9621 Acute and chronic respiratory failure with hypoxia: Secondary | ICD-10-CM | POA: Diagnosis not present

## 2020-06-30 DIAGNOSIS — J9622 Acute and chronic respiratory failure with hypercapnia: Secondary | ICD-10-CM | POA: Diagnosis not present

## 2020-06-30 DIAGNOSIS — I5030 Unspecified diastolic (congestive) heart failure: Secondary | ICD-10-CM | POA: Diagnosis not present

## 2020-06-30 DIAGNOSIS — I10 Essential (primary) hypertension: Secondary | ICD-10-CM | POA: Diagnosis not present

## 2020-06-30 DIAGNOSIS — R5381 Other malaise: Secondary | ICD-10-CM | POA: Diagnosis not present

## 2020-07-01 ENCOUNTER — Telehealth: Payer: Medicare HMO

## 2020-07-01 ENCOUNTER — Telehealth: Payer: Self-pay

## 2020-07-01 DIAGNOSIS — D519 Vitamin B12 deficiency anemia, unspecified: Secondary | ICD-10-CM | POA: Diagnosis not present

## 2020-07-01 DIAGNOSIS — E119 Type 2 diabetes mellitus without complications: Secondary | ICD-10-CM | POA: Diagnosis not present

## 2020-07-01 NOTE — Telephone Encounter (Signed)
  Care Management   Outreach Note  07/01/2020 Name: [name] White MRN: [id number] DOB: 03-Nov-1955  Referred by: Leeroy Bock, DO Reason for referral : Chronic Care Management (Follow up)   [name] White is enrolled in a Managed Medicaid Health Plan: No  An unsuccessful telephone outreach was attempted today. The patient was referred to the case management team for assistance with care management and care coordination.   Follow Up Plan: A HIPAA compliant phone message was left for the patient providing contact information and requesting a return call.  The care management team will reach out to the patient again over the next 5-7 days.   Juanell Fairly RN, BSN, [facility] Care Management Coordinator [facility] Phone: [phone] I Fax: [phone]

## 2020-07-02 ENCOUNTER — Ambulatory Visit: Payer: Medicare HMO

## 2020-07-02 DIAGNOSIS — E119 Type 2 diabetes mellitus without complications: Secondary | ICD-10-CM | POA: Diagnosis not present

## 2020-07-02 NOTE — Chronic Care Management (AMB) (Signed)
Care Management   RN Case Manager Follow UpNote  07/02/2020 Name: [name] White MRN: [id number] DOB: 11-16-1955 [name] White is a 65 y.o. year old female who sees Richarda Osmond, DO for primary care.  Patient is enrolled in a Managed Medicaid plan: No.  The Care Management team was consulted by PCP to assist the patient with  Disease Management and Educational Needs.   RNCM engaged with [name] White today Engaged with patient by telephone  in response to provider referral for RN case management and/or care coordination services. See care plan below for details during this encounter.  Follow up Plan:RNCM will wait for the patient to call if no call within the next 21 days RNCM will follow up.   Advanced Directives Status:Not addressed in this encounter.     SDOH (Social Determinants of Health) assessments performed: No       Patient Care Plan: RN Case Manager  Problem Identified: Disease Progression (COPD)   Goal: Disease Progression Minimized or Managed   Start Date: 05/19/2020  Expected End Date: 07/20/2020  Priority: Medium  Note:   Current Barriers:   Knowledge deficit related to basic COPD self care/management   Nurse Case Manager Clinical Goal(s):  Over the next 30 days, patient will be able to verbalize understanding of COPD action plan and when to seek appropriate levels of medical care  Over the next 90 days, patient will engage in lite exercise as tolerated to build/regain stamina and strength and reduce shortness of breath through activity tolerance   Interventions:   Provided patient with  verbal COPD education on self care/management/and exacerbation prevention   Provided patient with COPD action plan and reinforced importance of daily self assessment  Provided  verbal instructions on pursed lip breathing   healthy lifestyle promoted  necessary immunization facilitated  signs/symptoms of infection reviewed medication-adherence assessment  completed  self-awareness of symptom triggers encouraged  keep room cool and dark  avoid second hand smoke  identify and remove indoor air pollutants  listen for public air quality announcements every day  Patient reached out to [facility] to let me know that she had received my message.  She could not talk long on the phone due to her breathing and was using the BIPAP most of the day.  She is located in [place] at the [facility] for rehab.  It has been a challenging adjustment for her but she is making it.  She receives therapy every day of the week and she feels that there therapist are very caring and motivated.  She plans to stay as long as she is allowed to take advantage of the physical therapy to help her get better. She states that she will call me when she is stronger to talk more.  Patient Goals/Self-Care Activities:   Take medications as prescribed including inhalers  Practice and use pursed lip breathing for shortness of breath recovery and prevention  Self assess COPD action plan zone and make appointment with provider if you have been in the yellow zone for 48 hours without improvement.  Engage in lite exercise as tolerated 3-5 days a week  Utilize infection prevention strategies to reduce risk of respiratory infection        Outpatient Encounter Medications as of 07/02/2020  Medication Sig  . acetaminophen (TYLENOL) 325 MG tablet Take 2 tablets (650 mg total) by mouth every 6 (six) hours as needed for mild pain. (Patient taking differently: Take 650 mg by mouth every 6 (  six) hours as needed for mild pain or headache.)  . acetaminophen (TYLENOL) 500 MG tablet Take 500 mg by mouth in the morning, at noon, and at bedtime.  Marland Kitchen albuterol (VENTOLIN HFA) 108 (90 Base) MCG/ACT inhaler Inhale 2 puffs into the lungs every 4 (four) hours as needed for wheezing or shortness of breath.  Marland Kitchen apixaban (ELIQUIS) 5 MG TABS tablet Take 1 tablet (5 mg total) by mouth 2 (two) times daily.  Marland Kitchen  atorvastatin (LIPITOR) 40 MG tablet Take 1 tablet (40 mg total) by mouth daily. (Patient taking differently: Take 40 mg by mouth at bedtime.)  . azelastine (OPTIVAR) 0.05 % ophthalmic solution INSTILL 1 DROP IN BOTH EYES TWICE DAILY AS NEEDED FOR ITCHY EYES (Patient not taking: Reported on 06/14/2020)  . bacitracin ointment Apply topically 2 (two) times daily.  . cetirizine (ZYRTEC) 10 MG tablet TAKE 1 TABLET(10 MG) BY MOUTH DAILY (Patient taking differently: Take 10 mg by mouth daily.)  . diclofenac sodium (VOLTAREN) 1 % GEL APPLY 2 GRAMS EXTERNALLY TO THE AFFECTED AREA FOUR TIMES DAILY (Patient taking differently: Apply 2 g topically 4 (four) times daily as needed (arthritis pain - knees, shoulder and thighs).)  . diltiazem (CARDIZEM CD) 120 MG 24 hr capsule TAKE 1 CAPSULE BY MOUTH ONCE DAILY (Patient taking differently: Take 120 mg by mouth daily.)  . ferrous sulfate 325 (65 FE) MG tablet Take 1 tablet (325 mg total) by mouth every other day.  . flecainide (TAMBOCOR) 100 MG tablet TAKE 1 TABLET BY MOUTH TWICE DAILY (Patient taking differently: Take 100 mg by mouth 2 (two) times daily.)  . fluticasone (FLONASE) 50 MCG/ACT nasal spray Place 1 spray into both nostrils daily.  . furosemide (LASIX) 40 MG tablet Take 1.5 tablets (60 mg total) by mouth daily.  Marland Kitchen gabapentin (NEURONTIN) 100 MG capsule Take 2 capsules (200 mg total) by mouth 3 (three) times daily.  Marland Kitchen guaiFENesin (ROBITUSSIN) 100 MG/5ML SOLN Take 5 mLs (100 mg total) by mouth every 12 (twelve) hours as needed for cough or to loosen phlegm.  . hydrocortisone cream 1 % Apply topically 2 (two) times daily.  . hydrOXYzine (VISTARIL) 25 MG capsule TAKE 1 CAPSULE(25 MG) BY MOUTH DAILY AS NEEDED FOR ANXIETY (Patient taking differently: Take 25 mg by mouth daily as needed for anxiety.)  . ipratropium (ATROVENT) 0.06 % nasal spray Place 2 sprays into both nostrils 4 (four) times daily.  Marland Kitchen ipratropium-albuterol (DUONEB) 0.5-2.5 (3) MG/3ML SOLN Take 3  mLs by nebulization every 6 (six) hours as needed (shortness of breath, wheezing).  . Melatonin 3 MG TABS Take 1 tablet (3 mg total) by mouth at bedtime as needed (sleep). (Patient taking differently: Take 3 mg by mouth at bedtime.)  . metoprolol tartrate (LOPRESSOR) 25 MG tablet TAKE 1/2 TABLET(12.5 MG) BY MOUTH TWICE DAILY (Patient taking differently: Take 12.5 mg by mouth 2 (two) times daily.)  . Multiple Vitamin (MULTIVITAMIN) tablet Take 1 tablet by mouth every evening.   Marland Kitchen omeprazole (PRILOSEC) 20 MG capsule Take 1 capsule (20 mg total) by mouth daily.  . OXYGEN Inhale 6-7 L/min into the lungs continuous.   . polyethylene glycol (MIRALAX / GLYCOLAX) 17 g packet Take 17 g by mouth daily as needed for mild constipation or moderate constipation.  . potassium chloride (KLOR-CON) 10 MEQ tablet TAKE 2 TABLETS(20 MEQ) BY MOUTH DAILY (Patient taking differently: Take 20 mEq by mouth daily.)  . PRESCRIPTION MEDICATION See admin instructions. [employer]-  At bedtime and during  any time of rest  . venlafaxine (EFFEXOR) 37.5 MG tablet Take 1 tablet (37.5 mg total) by mouth daily with breakfast. Take with a 150mg  ER capsule every morning for a total of 187.5mg  (Patient taking differently: Take 37.5 mg by mouth daily. Take with a 150mg  ER capsule every morning for a total of 187.5mg )  . venlafaxine XR (EFFEXOR-XR) 150 MG 24 hr capsule Take 1 capsule (150 mg total) by mouth daily with breakfast. Take with a 37.5 mg tablet every morning (Patient taking differently: Take 150 mg by mouth daily. Take with a 37.5mg  tablet every morning for a total of 187.5mg )   No facility-administered encounter medications on file as of 07/02/2020.    Review of patient status, including review of consultants reports, relevant laboratory and other test results, and collaboration with appropriate care team members and the patient's provider was performed as part of comprehensive patient evaluation and provision  of chronic care management services.       Information about Care Management services was shared with Ms.  Lightsey today including:  1. Care Management services include personalized support from designated clinical staff supervised by her physician, including individualized plan of care and coordination with other care providers 2. Remind patient of 24/7 contact phone numbers to provider's office for assistance with urgent and routine care needs. 3. Care Management services are voluntary and patient may stop at any time .   Patient agreed to services provided today and verbal consent obtained.

## 2020-07-02 NOTE — Patient Instructions (Signed)
  [name] White  it was nice speaking with you. Please call me directly [phone] if you have questions about the goals we discussed.  Follow up Plan:RNCM will wait for the patient to call if no call within the next 21 days RNCM will follow up.    Patient Care Plan: RN Case Manager  Problem Identified: Disease Progression (COPD)   Goal: Disease Progression Minimized or Managed   Start Date: 05/19/2020  Expected End Date: 07/20/2020  Priority: Medium  Note:   Current Barriers:   Knowledge deficit related to basic COPD self care/management   Nurse Case Manager Clinical Goal(s):  Over the next 30 days, patient will be able to verbalize understanding of COPD action plan and when to seek appropriate levels of medical care  Over the next 90 days, patient will engage in lite exercise as tolerated to build/regain stamina and strength and reduce shortness of breath through activity tolerance   Interventions:   Provided patient with  verbal COPD education on self care/management/and exacerbation prevention   Provided patient with COPD action plan and reinforced importance of daily self assessment  Provided  verbal instructions on pursed lip breathing   healthy lifestyle promoted  necessary immunization facilitated  signs/symptoms of infection reviewed medication-adherence assessment completed  self-awareness of symptom triggers encouraged  keep room cool and dark  avoid second hand smoke  identify and remove indoor air pollutants  listen for public air quality announcements every day  Patient reached out to [facility] to let me know that she had received my message.  She could not talk long on the phone due to her breathing and was using the BIPAP most of the day.  She is located in [place] at the [facility] for rehab.  It has been a challenging adjustment for her but she is making it.  She receives therapy every day of the week and she feels that there therapist are very caring and  motivated.  She plans to stay as long as she is allowed to take advantage of the physical therapy to help her get better. She states that she will call me when she is stronger to talk more.  Patient Goals/Self-Care Activities:   Take medications as prescribed including inhalers  Practice and use pursed lip breathing for shortness of breath recovery and prevention  Self assess COPD action plan zone and make appointment with provider if you have been in the yellow zone for 48 hours without improvement.  Engage in lite exercise as tolerated 3-5 days a week  Utilize infection prevention strategies to reduce risk of respiratory infection        [name] White received Care Management services today:  1. Care Management services include personalized support from designated clinical staff supervised by her physician, including individualized plan of care and coordination with other care providers 2. 24/7 contact [phone] for assistance for urgent and routine care needs. 3. Care Management are voluntary services and be declined at any time by calling the office.  The patient verbalized understanding of instructions provided today and declined a print copy of patient instruction materials.     Juanell Fairly, RN

## 2020-07-03 ENCOUNTER — Telehealth (HOSPITAL_COMMUNITY): Payer: Self-pay | Admitting: Vascular Surgery

## 2020-07-03 DIAGNOSIS — J9622 Acute and chronic respiratory failure with hypercapnia: Secondary | ICD-10-CM | POA: Diagnosis not present

## 2020-07-03 DIAGNOSIS — J9621 Acute and chronic respiratory failure with hypoxia: Secondary | ICD-10-CM | POA: Diagnosis not present

## 2020-07-03 NOTE — Telephone Encounter (Signed)
Left pt message giving new pt appt w/ db, 08/21/20 @ 11:40AM, Asked pt to call back to confirm, new pt packet sent

## 2020-07-06 DIAGNOSIS — J9622 Acute and chronic respiratory failure with hypercapnia: Secondary | ICD-10-CM | POA: Diagnosis not present

## 2020-07-06 DIAGNOSIS — J9621 Acute and chronic respiratory failure with hypoxia: Secondary | ICD-10-CM | POA: Diagnosis not present

## 2020-07-08 ENCOUNTER — Telehealth: Payer: Medicare HMO

## 2020-07-09 DIAGNOSIS — J9611 Chronic respiratory failure with hypoxia: Secondary | ICD-10-CM | POA: Diagnosis not present

## 2020-07-14 DIAGNOSIS — J9622 Acute and chronic respiratory failure with hypercapnia: Secondary | ICD-10-CM | POA: Diagnosis not present

## 2020-07-14 DIAGNOSIS — J9621 Acute and chronic respiratory failure with hypoxia: Secondary | ICD-10-CM | POA: Diagnosis not present

## 2020-07-16 ENCOUNTER — Ambulatory Visit: Payer: Medicare HMO

## 2020-07-21 DIAGNOSIS — L0292 Furuncle, unspecified: Secondary | ICD-10-CM | POA: Diagnosis not present

## 2020-07-27 DIAGNOSIS — L0292 Furuncle, unspecified: Secondary | ICD-10-CM | POA: Diagnosis not present

## 2020-07-30 DIAGNOSIS — E1159 Type 2 diabetes mellitus with other circulatory complications: Secondary | ICD-10-CM | POA: Diagnosis not present

## 2020-07-30 DIAGNOSIS — R600 Localized edema: Secondary | ICD-10-CM | POA: Diagnosis not present

## 2020-07-30 DIAGNOSIS — B351 Tinea unguium: Secondary | ICD-10-CM | POA: Diagnosis not present

## 2020-08-02 ENCOUNTER — Other Ambulatory Visit: Payer: Self-pay | Admitting: Family Medicine

## 2020-08-03 ENCOUNTER — Other Ambulatory Visit: Payer: Self-pay | Admitting: Student in an Organized Health Care Education/Training Program

## 2020-08-04 DIAGNOSIS — I10 Essential (primary) hypertension: Secondary | ICD-10-CM | POA: Diagnosis not present

## 2020-08-05 DIAGNOSIS — R5381 Other malaise: Secondary | ICD-10-CM | POA: Diagnosis not present

## 2020-08-07 DIAGNOSIS — J9621 Acute and chronic respiratory failure with hypoxia: Secondary | ICD-10-CM | POA: Diagnosis not present

## 2020-08-07 DIAGNOSIS — I10 Essential (primary) hypertension: Secondary | ICD-10-CM | POA: Diagnosis not present

## 2020-08-09 DIAGNOSIS — J9611 Chronic respiratory failure with hypoxia: Secondary | ICD-10-CM | POA: Diagnosis not present

## 2020-08-14 DIAGNOSIS — M62838 Other muscle spasm: Secondary | ICD-10-CM | POA: Diagnosis not present

## 2020-08-18 DIAGNOSIS — H04123 Dry eye syndrome of bilateral lacrimal glands: Secondary | ICD-10-CM | POA: Diagnosis not present

## 2020-08-18 DIAGNOSIS — M62838 Other muscle spasm: Secondary | ICD-10-CM | POA: Diagnosis not present

## 2020-08-18 DIAGNOSIS — E119 Type 2 diabetes mellitus without complications: Secondary | ICD-10-CM | POA: Diagnosis not present

## 2020-08-18 DIAGNOSIS — R5381 Other malaise: Secondary | ICD-10-CM | POA: Diagnosis not present

## 2020-08-18 DIAGNOSIS — H2513 Age-related nuclear cataract, bilateral: Secondary | ICD-10-CM | POA: Diagnosis not present

## 2020-08-18 DIAGNOSIS — H1045 Other chronic allergic conjunctivitis: Secondary | ICD-10-CM | POA: Diagnosis not present

## 2020-08-20 NOTE — Progress Notes (Signed)
Patient did not show for appt. Note left for templating purposes only       ADVANCED HF CLINIC CONSULT NOTE  Referring Physician: Primary Care: Richarda Osmond, DO Primary Cardiologist: Lovena Le   HPI:  [name] White is a 65 y.o. female with history of morbid obesity, HTN, hyperthyroid, COPD, OHS, OSA, chronic CHF (HFpEF), PAF, h/o PE and depression. Referred to HF clinic for further evaluation of her HF byt the IM Teaching Service.   Has been followed by Dr. Lovena Le for atypical AFL managed with Flecainide.   Admitted 9/21 w/acute on chronic respiratory failure.Felt to be multifactorial w/OHS, OSA, and diastolic HF. Only required one dose of IV lasix, did require short term BIPAP. She was reported to be rate controlled Afib  Admitted 12/21 due to acute on chronic hypoxic/hypercapnic respiratory failure in setting of inability to use Bipap. D/c'd to SNF.   Echo 5/21 EF 76-28% diastolic parameters indeterminate due to AF. RV ok.  Personally reviewed    Review of Systems: [y] = yes, [ ]  = no   General: Weight gain [ ] ; Weight loss [ ] ; Anorexia [ ] ; Fatigue [ ] ; Fever [ ] ; Chills [ ] ; Weakness [ ]   Cardiac: Chest pain/pressure [ ] ; Resting SOB [ ] ; Exertional SOB [ ] ; Orthopnea [ ] ; Pedal Edema [ ] ; Palpitations [ ] ; Syncope [ ] ; Presyncope [ ] ; Paroxysmal nocturnal dyspnea[ ]   Pulmonary: Cough [ ] ; Wheezing[ ] ; Hemoptysis[ ] ; Sputum [ ] ; Snoring [ ]   GI: Vomiting[ ] ; Dysphagia[ ] ; Melena[ ] ; Hematochezia [ ] ; Heartburn[ ] ; Abdominal pain [ ] ; Constipation [ ] ; Diarrhea [ ] ; BRBPR [ ]   GU: Hematuria[ ] ; Dysuria [ ] ; Nocturia[ ]   Vascular: Pain in legs with walking [ ] ; Pain in feet with lying flat [ ] ; Non-healing sores [ ] ; Stroke [ ] ; TIA [ ] ; Slurred speech [ ] ;  Neuro: Headaches[ ] ; Vertigo[ ] ; Seizures[ ] ; Paresthesias[ ] ;Blurred vision [ ] ; Diplopia [ ] ; Vision changes [ ]   Ortho/Skin: Arthritis [ ] ; Joint pain [ ] ; Muscle pain [ ] ; Joint swelling [ ] ; Back Pain  [ ] ; Rash [ ]   Psych: Depression[ ] ; Anxiety[ ]   Heme: Bleeding problems [ ] ; Clotting disorders [ ] ; Anemia [ ]   Endocrine: Diabetes [ ] ; Thyroid dysfunction[ ]    Past Medical History:  Diagnosis Date   Abdominal wall hematoma 03/06/2019   Acute bronchitis 08/29/2018   Acute on chronic respiratory failure with hypoxia (HCC) 08/29/2018   Anxiety    Asthma    CHF (congestive heart failure) (HCC)    Chronic lower back pain    COPD (chronic obstructive pulmonary disease) ([place])    Depression    Diabetes mellitus without complication ([place])    Exposure to COVID-19 virus 11/02/2018   Fall 02/2019   Family history of anesthesia complication    "daughter; causes her to pass out afterwards"   GERD (gastroesophageal reflux disease)    History of atrial flutter 06/26/2015   History of pulmonary embolus (PE) 11/03/2014   Hypertension    Hyperthyroidism    Left medial tibial stress syndrome 12/26/2017   Lower GI bleed    Migraine    "monthly" (12/28/2013)   Non-traumatic rhabdomyolysis 11/03/2014   Obesity hypoventilation syndrome ([place]) 03/06/2019   Osteoarthritis    "both knees; back of my neck; right pelvic bone" (12/28/2013)   Paroxysmal A-fib ([place])    Pulmonary embolism ([place]) 12/28/2013   "2 clots in each lung"  Current Outpatient Medications  Medication Sig Dispense Refill   ipratropium-albuterol (DUONEB) 0.5-2.5 (3) MG/3ML SOLN USE 3 ML VIA NEBULIZER EVERY 12 HOURS AS NEEDED FOR SHORTNESS OF BREATH OR WHEEZING 360 mL 0   acetaminophen (TYLENOL) 325 MG tablet Take 2 tablets (650 mg total) by mouth every 6 (six) hours as needed for mild pain. (Patient taking differently: Take 650 mg by mouth every 6 (six) hours as needed for mild pain or headache.)     acetaminophen (TYLENOL) 500 MG tablet Take 500 mg by mouth in the morning, at noon, and at bedtime.     albuterol (VENTOLIN HFA) 108 (90 Base) MCG/ACT inhaler Inhale 2 puffs into the lungs every 4 (four) hours as needed for wheezing or shortness of  breath. 18 g 1   apixaban (ELIQUIS) 5 MG TABS tablet Take 1 tablet (5 mg total) by mouth 2 (two) times daily. 180 tablet 1   atorvastatin (LIPITOR) 40 MG tablet Take 1 tablet (40 mg total) by mouth daily. (Patient taking differently: Take 40 mg by mouth at bedtime.) 90 tablet 3   azelastine (OPTIVAR) 0.05 % ophthalmic solution INSTILL 1 DROP IN BOTH EYES TWICE DAILY AS NEEDED FOR ITCHY EYES (Patient not taking: Reported on 06/14/2020) 6 mL 1   bacitracin ointment Apply topically 2 (two) times daily. 120 g 0   cetirizine (ZYRTEC) 10 MG tablet TAKE 1 TABLET(10 MG) BY MOUTH DAILY (Patient taking differently: Take 10 mg by mouth daily.) 30 tablet 1   diclofenac sodium (VOLTAREN) 1 % GEL APPLY 2 GRAMS EXTERNALLY TO THE AFFECTED AREA FOUR TIMES DAILY (Patient taking differently: Apply 2 g topically 4 (four) times daily as needed (arthritis pain - knees, shoulder and thighs).) 100 g 0   diltiazem (CARDIZEM CD) 120 MG 24 hr capsule TAKE 1 CAPSULE BY MOUTH ONCE DAILY (Patient taking differently: Take 120 mg by mouth daily.) 30 capsule 0   ferrous sulfate 325 (65 FE) MG tablet Take 1 tablet (325 mg total) by mouth every other day. 30 tablet 3   flecainide (TAMBOCOR) 100 MG tablet TAKE 1 TABLET BY MOUTH TWICE DAILY (Patient taking differently: Take 100 mg by mouth 2 (two) times daily.) 180 tablet 3   fluticasone (FLONASE) 50 MCG/ACT nasal spray Place 1 spray into both nostrils daily. 3 mL 0   furosemide (LASIX) 40 MG tablet Take 1.5 tablets (60 mg total) by mouth daily. 180 tablet 1   gabapentin (NEURONTIN) 100 MG capsule Take 2 capsules (200 mg total) by mouth 3 (three) times daily. 180 capsule 1   guaiFENesin (ROBITUSSIN) 100 MG/5ML SOLN Take 5 mLs (100 mg total) by mouth every 12 (twelve) hours as needed for cough or to loosen phlegm. 236 mL 0   hydrocortisone cream 1 % Apply topically 2 (two) times daily. 30 g 0   hydrOXYzine (VISTARIL) 25 MG capsule TAKE 1 CAPSULE(25 MG) BY MOUTH DAILY AS NEEDED FOR ANXIETY  (Patient taking differently: Take 25 mg by mouth daily as needed for anxiety.) 60 capsule 0   ipratropium (ATROVENT) 0.06 % nasal spray Place 2 sprays into both nostrils 4 (four) times daily. 15 mL 12   Melatonin 3 MG TABS Take 1 tablet (3 mg total) by mouth at bedtime as needed (sleep). (Patient taking differently: Take 3 mg by mouth at bedtime.) 30 tablet 0   metoprolol tartrate (LOPRESSOR) 25 MG tablet TAKE 1/2 TABLET(12.5 MG) BY MOUTH TWICE DAILY (Patient taking differently: Take 12.5 mg by mouth 2 (two) times daily.) 90 tablet  0   Multiple Vitamin (MULTIVITAMIN) tablet Take 1 tablet by mouth every evening.      omeprazole (PRILOSEC) 20 MG capsule Take 1 capsule (20 mg total) by mouth daily. 90 capsule 0   OXYGEN Inhale 6-7 L/min into the lungs continuous.      polyethylene glycol (MIRALAX / GLYCOLAX) 17 g packet Take 17 g by mouth daily as needed for mild constipation or moderate constipation. 14 each 0   potassium chloride (KLOR-CON) 10 MEQ tablet TAKE 2 TABLETS(20 MEQ) BY MOUTH DAILY (Patient taking differently: Take 20 mEq by mouth daily.) 180 tablet 0   PRESCRIPTION MEDICATION See admin instructions. [employer]-  At bedtime and during any time of rest     venlafaxine (EFFEXOR) 37.5 MG tablet Take 1 tablet (37.5 mg total) by mouth daily with breakfast. Take with a 150mg  ER capsule every morning for a total of 187.5mg  (Patient taking differently: Take 37.5 mg by mouth daily. Take with a 150mg  ER capsule every morning for a total of 187.5mg ) 90 tablet 2   venlafaxine XR (EFFEXOR-XR) 150 MG 24 hr capsule Take 1 capsule (150 mg total) by mouth daily with breakfast. Take with a 37.5 mg tablet every morning (Patient taking differently: Take 150 mg by mouth daily. Take with a 37.5mg  tablet every morning for a total of 187.5mg ) 90 capsule 3   No current facility-administered medications for this encounter.    Allergies  Allergen Reactions   Caffeine Other (See Comments)     Migraine   Hydralazine Hcl Other (See Comments)    AKI leading to rhabdo and electrolyte abnormalities   Hydrocodone Nausea And Vomiting    Headache, also   Ciprofloxacin Hives and Rash   Erythromycin Hives and Rash   Lisinopril Cough   Oxycodone Nausea And Vomiting and Other (See Comments)    Headaches, also   Sulfamethoxazole-Trimethoprim Rash   Tape Rash and Other (See Comments)      Social History   Socioeconomic History   Marital status: Divorced    Spouse name: Not on file   Number of children: 1   Years of education: 12   Highest education level: High school graduate  Occupational History   Occupation: disabled   Tobacco Use   Smoking status: Former Smoker    Packs/day: 0.25    Years: 20.00    Pack years: 5.00    Types: Cigarettes    Quit date: 07/17/1999    Years since quitting: 21.1   Smokeless tobacco: Never Used  Scientific laboratory technician Use: Never used  Substance and Sexual Activity   Alcohol use: Not Currently    Comment: "drank some in my 30's"   Drug use: No   Sexual activity: Not Currently  Other Topics Concern   Not on file  Social History Narrative   Disabled single patient.   Food insecurities   Lack of transportation   Social Determinants of Health   Financial Resource Strain: Not on file  Food Insecurity: Not on file  Transportation Needs: Not on file  Physical Activity: Not on file  Stress: Not on file  Social Connections: Not on file  Intimate Partner Violence: Not on file      Family History  Problem Relation Age of Onset   Osteoarthritis Mother    Asthma Mother    Heart failure Mother    Breast cancer Daughter     There were no vitals filed for this visit.  PHYSICAL EXAM:  General:  Well appearing. No respiratory difficulty HEENT: normal Neck: supple. no JVD. Carotids 2+ bilat; no bruits. No lymphadenopathy or thryomegaly appreciated. Cor: PMI nondisplaced. Regular rate & rhythm. No rubs, gallops or murmurs. Lungs:  clear Abdomen: soft, nontender, nondistended. No hepatosplenomegaly. No bruits or masses. Good bowel sounds. Extremities: no cyanosis, clubbing, rash, edema Neuro: alert & oriented x 3, cranial nerves grossly intact. moves all 4 extremities w/o difficulty. Affect pleasant.  ECG:   ASSESSMENT & PLAN:  1. Chronic diastolic HF - Echo 3/89  37-34% diastolic parameters indeterminate due to AF. RV ok.  Personally reviewed - NYHA III - volume status ok  2. Chronic hypoxic hypercapnic respiratory failure - due to OSA/OHS - on home O2  - on BIPAP - continue current support. Needs weight loss  3. Permanent AF - has been on flecianide for atypical AFL - can stop flecainide now with permanent AF - Rate controlled - Continue apixaban  Glori Bickers, MD  10:47 PM

## 2020-08-21 ENCOUNTER — Inpatient Hospital Stay (HOSPITAL_BASED_OUTPATIENT_CLINIC_OR_DEPARTMENT_OTHER)
Admission: RE | Admit: 2020-08-21 | Discharge: 2020-08-21 | Disposition: A | Discharge status: Home / Self Care | Payer: Medicare HMO | Source: Ambulatory Visit | Attending: Internal Medicine | Admitting: Internal Medicine

## 2020-08-21 DIAGNOSIS — I5022 Chronic systolic (congestive) heart failure: Secondary | ICD-10-CM

## 2020-08-28 ENCOUNTER — Ambulatory Visit: Payer: Medicare HMO

## 2020-08-29 DIAGNOSIS — R069 Unspecified abnormalities of breathing: Secondary | ICD-10-CM | POA: Diagnosis not present

## 2020-08-29 DIAGNOSIS — I110 Hypertensive heart disease with heart failure: Secondary | ICD-10-CM | POA: Diagnosis not present

## 2020-08-29 DIAGNOSIS — J9601 Acute respiratory failure with hypoxia: Secondary | ICD-10-CM | POA: Diagnosis not present

## 2020-08-29 DIAGNOSIS — J9622 Acute and chronic respiratory failure with hypercapnia: Secondary | ICD-10-CM | POA: Diagnosis not present

## 2020-08-29 DIAGNOSIS — E662 Morbid (severe) obesity with alveolar hypoventilation: Secondary | ICD-10-CM | POA: Diagnosis not present

## 2020-08-29 DIAGNOSIS — I10 Essential (primary) hypertension: Secondary | ICD-10-CM | POA: Diagnosis not present

## 2020-08-29 DIAGNOSIS — E441 Mild protein-calorie malnutrition: Secondary | ICD-10-CM | POA: Diagnosis not present

## 2020-08-29 DIAGNOSIS — I509 Heart failure, unspecified: Secondary | ICD-10-CM | POA: Diagnosis not present

## 2020-08-29 DIAGNOSIS — R42 Dizziness and giddiness: Secondary | ICD-10-CM | POA: Diagnosis not present

## 2020-08-29 DIAGNOSIS — Z6841 Body Mass Index (BMI) 40.0 and over, adult: Secondary | ICD-10-CM | POA: Diagnosis not present

## 2020-08-29 DIAGNOSIS — R0902 Hypoxemia: Secondary | ICD-10-CM | POA: Diagnosis not present

## 2020-08-29 DIAGNOSIS — J9621 Acute and chronic respiratory failure with hypoxia: Secondary | ICD-10-CM | POA: Diagnosis not present

## 2020-08-29 DIAGNOSIS — Z20822 Contact with and (suspected) exposure to covid-19: Secondary | ICD-10-CM | POA: Diagnosis not present

## 2020-08-29 DIAGNOSIS — E785 Hyperlipidemia, unspecified: Secondary | ICD-10-CM | POA: Diagnosis not present

## 2020-08-29 DIAGNOSIS — Z66 Do not resuscitate: Secondary | ICD-10-CM | POA: Diagnosis not present

## 2020-08-29 DIAGNOSIS — J441 Chronic obstructive pulmonary disease with (acute) exacerbation: Secondary | ICD-10-CM | POA: Diagnosis not present

## 2020-08-29 DIAGNOSIS — J9620 Acute and chronic respiratory failure, unspecified whether with hypoxia or hypercapnia: Secondary | ICD-10-CM | POA: Diagnosis not present

## 2020-08-29 DIAGNOSIS — R079 Chest pain, unspecified: Secondary | ICD-10-CM | POA: Diagnosis not present

## 2020-08-29 DIAGNOSIS — J449 Chronic obstructive pulmonary disease, unspecified: Secondary | ICD-10-CM | POA: Diagnosis not present

## 2020-08-29 DIAGNOSIS — E1159 Type 2 diabetes mellitus with other circulatory complications: Secondary | ICD-10-CM | POA: Diagnosis not present

## 2020-08-29 DIAGNOSIS — Z9981 Dependence on supplemental oxygen: Secondary | ICD-10-CM | POA: Diagnosis not present

## 2020-08-29 DIAGNOSIS — I4891 Unspecified atrial fibrillation: Secondary | ICD-10-CM | POA: Diagnosis not present

## 2020-08-29 DIAGNOSIS — R0789 Other chest pain: Secondary | ICD-10-CM | POA: Diagnosis not present

## 2020-08-29 DIAGNOSIS — J9611 Chronic respiratory failure with hypoxia: Secondary | ICD-10-CM | POA: Diagnosis not present

## 2020-08-29 DIAGNOSIS — I480 Paroxysmal atrial fibrillation: Secondary | ICD-10-CM | POA: Diagnosis not present

## 2020-08-29 DIAGNOSIS — R2689 Other abnormalities of gait and mobility: Secondary | ICD-10-CM | POA: Diagnosis not present

## 2020-08-29 NOTE — Patient Instructions (Signed)
Visit Information  [name] White  it was nice speaking with you. Please call me directly [phone] if you have questions about the goals we discussed.  Goals Addressed              This Visit's Progress   .  I need to follow up on DME (pt-stated)        Timeframe:  Short-Term Goal Priority:  High Start Date:   08/28/20                          Expected End Date:  09/24/20                      Patient Goals/Self Care Activities:  . Patient will self administer medications as prescribed . Patient will call pharmacy for medication refills . Patient will call provider office for new concerns or questions . Patient will follow up on DME equipment as she needs to        The patient verbalized understanding of instructions, educational materials, and care plan provided today and declined offer to receive copy of patient instructions, educational materials, and care plan.   Follow up Plan: Patient would like continued follow-up.  CCM RNCM will outreach the patient withi the next 7days.. Patient will call office if needed prior to next encounter  Lazaro Arms, RN

## 2020-08-29 NOTE — Chronic Care Management (AMB) (Signed)
Care Management    RN Visit Note  08/29/2020 Name: [name] White MRN: [id number] DOB: 07/29/1955  Subjective: [name] White is a 65 y.o. year old female who is a primary care patient of [name] Osmond, DO. The care management team was consulted for assistance with disease management and care coordination needs.    Engaged with patient by telephone for follow up visit in response to provider referral for case management and/or care coordination services.   Consent to Services:   [name] was given information about Care Management services today including:  1. Care Management services includes personalized support from designated clinical staff supervised by her physician, including individualized plan of care and coordination with other care providers 2. 24/7 contact phone numbers for assistance for urgent and routine care needs. 3. The patient may stop case management services at any time by phone call to the office staff.  Patient agreed to services and consent obtained.    Assessment: Patient is currently experiencing difficulty with not havingmedications and DME eqipment... See Care Plan below for interventions and patient self-care actives. Follow up Plan: Patient would like continued follow-up.  CCM RNCM  will outreach the patient within the next 7b days.. Patient will call office if needed prior to next encounter : Review of patient past medical history, allergies, medications, health status, including review of consultants reports, laboratory and other test data, was performed as part of comprehensive evaluation and provision of chronic care management services.   SDOH (Social Determinants of Health) assessments and interventions performed:    Care Plan  Allergies  Allergen Reactions  . Caffeine Other (See Comments)    Migraine  . Hydralazine Hcl Other (See Comments)    AKI leading to rhabdo and electrolyte abnormalities  . Hydrocodone Nausea And Vomiting     Headache, also  . Ciprofloxacin Hives and Rash  . Erythromycin Hives and Rash  . Lisinopril Cough  . Oxycodone Nausea And Vomiting and Other (See Comments)    Headaches, also  . Sulfamethoxazole-Trimethoprim Rash  . Tape Rash and Other (See Comments)    Outpatient Encounter Medications as of 08/28/2020  Medication Sig  . acetaminophen (TYLENOL) 325 MG tablet Take 2 tablets (650 mg total) by mouth every 6 (six) hours as needed for mild pain. (Patient taking differently: Take 650 mg by mouth every 6 (six) hours as needed for mild pain or headache.)  . acetaminophen (TYLENOL) 500 MG tablet Take 500 mg by mouth in the morning, at noon, and at bedtime.  Marland Kitchen albuterol (VENTOLIN HFA) 108 (90 Base) MCG/ACT inhaler Inhale 2 puffs into the lungs every 4 (four) hours as needed for wheezing or shortness of breath.  Marland Kitchen apixaban (ELIQUIS) 5 MG TABS tablet Take 1 tablet (5 mg total) by mouth 2 (two) times daily.  Marland Kitchen atorvastatin (LIPITOR) 40 MG tablet Take 1 tablet (40 mg total) by mouth daily. (Patient taking differently: Take 40 mg by mouth at bedtime.)  . azelastine (OPTIVAR) 0.05 % ophthalmic solution INSTILL 1 DROP IN BOTH EYES TWICE DAILY AS NEEDED FOR ITCHY EYES (Patient not taking: Reported on 06/14/2020)  . bacitracin ointment Apply topically 2 (two) times daily.  . cetirizine (ZYRTEC) 10 MG tablet TAKE 1 TABLET(10 MG) BY MOUTH DAILY (Patient taking differently: Take 10 mg by mouth daily.)  . diclofenac sodium (VOLTAREN) 1 % GEL APPLY 2 GRAMS EXTERNALLY TO THE AFFECTED AREA FOUR TIMES DAILY (Patient taking differently: Apply 2 g topically 4 (four) times daily as needed (  arthritis pain - knees, shoulder and thighs).)  . diltiazem (CARDIZEM CD) 120 MG 24 hr capsule TAKE 1 CAPSULE BY MOUTH ONCE DAILY (Patient taking differently: Take 120 mg by mouth daily.)  . ferrous sulfate 325 (65 FE) MG tablet Take 1 tablet (325 mg total) by mouth every other day.  . flecainide (TAMBOCOR) 100 MG tablet TAKE 1 TABLET BY  MOUTH TWICE DAILY (Patient taking differently: Take 100 mg by mouth 2 (two) times daily.)  . fluticasone (FLONASE) 50 MCG/ACT nasal spray Place 1 spray into both nostrils daily.  . furosemide (LASIX) 40 MG tablet Take 1.5 tablets (60 mg total) by mouth daily.  Marland Kitchen gabapentin (NEURONTIN) 100 MG capsule Take 2 capsules (200 mg total) by mouth 3 (three) times daily.  Marland Kitchen guaiFENesin (ROBITUSSIN) 100 MG/5ML SOLN Take 5 mLs (100 mg total) by mouth every 12 (twelve) hours as needed for cough or to loosen phlegm.  . hydrocortisone cream 1 % Apply topically 2 (two) times daily.  . hydrOXYzine (VISTARIL) 25 MG capsule TAKE 1 CAPSULE(25 MG) BY MOUTH DAILY AS NEEDED FOR ANXIETY (Patient taking differently: Take 25 mg by mouth daily as needed for anxiety.)  . ipratropium (ATROVENT) 0.06 % nasal spray Place 2 sprays into both nostrils 4 (four) times daily.  Marland Kitchen ipratropium-albuterol (DUONEB) 0.5-2.5 (3) MG/3ML SOLN USE 3 ML VIA NEBULIZER EVERY 12 HOURS AS NEEDED FOR SHORTNESS OF BREATH OR WHEEZING  . Melatonin 3 MG TABS Take 1 tablet (3 mg total) by mouth at bedtime as needed (sleep). (Patient taking differently: Take 3 mg by mouth at bedtime.)  . metoprolol tartrate (LOPRESSOR) 25 MG tablet TAKE 1/2 TABLET(12.5 MG) BY MOUTH TWICE DAILY (Patient taking differently: Take 12.5 mg by mouth 2 (two) times daily.)  . Multiple Vitamin (MULTIVITAMIN) tablet Take 1 tablet by mouth every evening.   Marland Kitchen omeprazole (PRILOSEC) 20 MG capsule Take 1 capsule (20 mg total) by mouth daily.  . OXYGEN Inhale 6-7 L/min into the lungs continuous.   . polyethylene glycol (MIRALAX / GLYCOLAX) 17 g packet Take 17 g by mouth daily as needed for mild constipation or moderate constipation.  . potassium chloride (KLOR-CON) 10 MEQ tablet TAKE 2 TABLETS(20 MEQ) BY MOUTH DAILY (Patient taking differently: Take 20 mEq by mouth daily.)  . PRESCRIPTION MEDICATION See admin instructions. [employer]-  At bedtime and during any time  of rest  . venlafaxine (EFFEXOR) 37.5 MG tablet Take 1 tablet (37.5 mg total) by mouth daily with breakfast. Take with a 150mg  ER capsule every morning for a total of 187.5mg  (Patient taking differently: Take 37.5 mg by mouth daily. Take with a 150mg  ER capsule every morning for a total of 187.5mg )  . venlafaxine XR (EFFEXOR-XR) 150 MG 24 hr capsule Take 1 capsule (150 mg total) by mouth daily with breakfast. Take with a 37.5 mg tablet every morning (Patient taking differently: Take 150 mg by mouth daily. Take with a 37.5mg  tablet every morning for a total of 187.5mg )   No facility-administered encounter medications on file as of 08/28/2020.    Patient Active Problem List   Diagnosis Date Noted  . Normocytic anemia 06/22/2020  . Difficulty using biphasic positive airway pressure (BiPAP) machine 06/22/2020  . Severe comorbid illness   . Altered mental status   . Post-nasal drip 05/19/2020  . Frequent falls 05/06/2020  . Acute exacerbation of CHF (congestive heart failure) ([place]) 03/12/2020  . Community acquired pneumonia   . Acute respiratory failure with hypoxia ([place]) 02/06/2020  .  SOB (shortness of breath) 11/24/2019  . Respiratory distress   . Respiratory failure, acute-on-chronic ([place]) 11/09/2019  . Seasonal allergies 10/03/2019  . Debility 08/02/2019  . Hypertension associated with diabetes ([place])   . Weight gain due to medication 06/05/2019  . Acute on chronic heart failure with preserved ejection fraction ([place]) 03/14/2019  . Acquired thrombophilia ([place]) due to A-Fib 03/07/2019  . Heart failure with preserved ejection fraction ([place]) 03/06/2019  . Obesity hypoventilation syndrome ([place]) 03/06/2019  . Lower GI bleed   . COPD exacerbation ([place]) 02/26/2019  . Urge incontinence 01/27/2019  . Shortness of breath 08/29/2018  . OSA (obstructive sleep apnea) 07/05/2018  . Respiratory failure ([place]) 07/05/2018  . Severe episode of recurrent major depressive disorder, without psychotic  features ([place])   . MDD (major depressive disorder), recurrent episode, severe ([place]) 09/07/2015  . Atrial flutter ([place]) 07/29/2015  . Asthma 11/12/2014  . History of hypertension 11/05/2014  . Paroxysmal atrial fibrillation ([place]) 11/03/2014  . Chronic anticoagulation 11/03/2014  . Major depressive disorder 11/03/2014  . History of pulmonary embolus (PE) 11/03/2014  . MDD (major depressive disorder), recurrent severe, without psychosis ([place]) 03/05/2014  . Hot flashes 04/22/2011  . OVERACTIVE BLADDER 04/18/2008  . Osteoarthritis of both knees 04/18/2008  . Osteoarthritis involving multiple joints on both sides of body 09/14/2007  . Morbid obesity ([place]) 08/24/2006  . RESTLESS LEGS SYNDROME 08/24/2006  . HYPERTENSION, BENIGN SYSTEMIC 08/24/2006  . RHINITIS, ALLERGIC 08/24/2006  . GASTROESOPHAGEAL REFLUX, NO ESOPHAGITIS 08/24/2006    Conditions to be addressed/monitored: DME and Medications  Care Plan : RN Case Manager  Updates made by Lazaro Arms, RN since 08/29/2020 12:00 AM  Problem: Quality of Life DME   Priority: High  Onset Date: 08/28/2020  Goal: Quality of Life Maintained for the patient with Copd and Acute on Chrronic hypercapnic respiratory failure   Start Date: 08/28/2020  Expected End Date: 09/04/2020  Priority: High  . Care Coordination needs related to needed DME/Supplies in a patient with COPD and Acute on chronic hypoxic hypercapnic respiratory failure . Patient called RNCM reporting that she had been release from the [facility] and sent to [facility] on [address] in [place].  She did not have her medications nor her DME Equipment and was afraid she would end up back in the hospital.  Nurse Case Manager Clinical Goal(s):  Marland Kitchen Over the next 5 days, patient will verbalize understanding of plan to obtain needed DME.  . Over the next 14 days, patient will work with care guide and/or [employer] to address needs related to DME needs. . patient will verbalize  understanding of plan for to receive her medications and DME equipment  Interventions:  . Inter-disciplinary care team collaboration (see longitudinal plan of care) . Collaborated with Miquel Dunn , Skeet Latch  and Darlina Guys regarding DME Equipment via [employer]. They worked diligently to help Mrs Human get her situation taken care of. . Discussed plans with patient for ongoing care management follow up and provided patient with direct contact information for care management team . RNCM had the patient to call [facility] to make sure they were working on her 20 medications that needed to be filled  Patient Goals/Self Care Activities:  . Patient will self administer medications as prescribed . Patient will call pharmacy for medication refills . Patient will call provider office for new concerns or questions . Patient will follow up on DME equipment as she needs to  Lazaro Arms RN, BSN, [facility] Care Management Coordinator [place] Phone: [phone] I Fax: [phone]

## 2020-08-30 DIAGNOSIS — J449 Chronic obstructive pulmonary disease, unspecified: Secondary | ICD-10-CM | POA: Diagnosis not present

## 2020-08-30 DIAGNOSIS — J9622 Acute and chronic respiratory failure with hypercapnia: Secondary | ICD-10-CM | POA: Diagnosis not present

## 2020-08-31 ENCOUNTER — Ambulatory Visit: Payer: Medicare HMO

## 2020-08-31 DIAGNOSIS — J9622 Acute and chronic respiratory failure with hypercapnia: Secondary | ICD-10-CM | POA: Diagnosis not present

## 2020-08-31 DIAGNOSIS — J449 Chronic obstructive pulmonary disease, unspecified: Secondary | ICD-10-CM | POA: Diagnosis not present

## 2020-08-31 NOTE — Chronic Care Management (AMB) (Signed)
Care Management    RN Visit Note  08/31/2020 Name: [name] White MRN: [id number] DOB: 06-19-1956  Subjective: [name] White is a 65 y.o. year old female who is a primary care patient of [name] Osmond, DO. The care management team was consulted for assistance with disease management and care coordination needs.    Engaged with patient by telephone for follow up visit in response to provider referral for case management and/or care coordination services.   Consent to Services:   [name] White was given information about Care Management services today including:  1. Care Management services includes personalized support from designated clinical staff supervised by her physician, including individualized plan of care and coordination with other care providers 2. 24/7 contact phone numbers for assistance for urgent and routine care needs. 3. The patient may stop case management services at any time by phone call to the office staff.  Patient agreed to services and consent obtained.    Assessment: Patient continues to experience difficulty with her breathing.. See Care Plan below for interventions and patient self-care actives. Follow up Plan: Patient would like continued follow-up.  CCM RNCM will outreach the patient within the next 14 days .Marland Kitchen Patient will call office if needed prior to next encounter t: Review of patient past medical history, allergies, medications, health status, including review of consultants reports, laboratory and other test data, was performed as part of comprehensive evaluation and provision of chronic care management services.   SDOH (Social Determinants of Health) assessments and interventions performed:    Care Plan  Allergies  Allergen Reactions  . Caffeine Other (See Comments)    Migraine  . Hydralazine Hcl Other (See Comments)    AKI leading to rhabdo and electrolyte abnormalities  . Hydrocodone Nausea And Vomiting    Headache, also  .  Ciprofloxacin Hives and Rash  . Erythromycin Hives and Rash  . Lisinopril Cough  . Oxycodone Nausea And Vomiting and Other (See Comments)    Headaches, also  . Sulfamethoxazole-Trimethoprim Rash  . Tape Rash and Other (See Comments)    Outpatient Encounter Medications as of 08/31/2020  Medication Sig  . acetaminophen (TYLENOL) 325 MG tablet Take 2 tablets (650 mg total) by mouth every 6 (six) hours as needed for mild pain. (Patient taking differently: Take 650 mg by mouth every 6 (six) hours as needed for mild pain or headache.)  . acetaminophen (TYLENOL) 500 MG tablet Take 500 mg by mouth in the morning, at noon, and at bedtime.  Marland Kitchen albuterol (VENTOLIN HFA) 108 (90 Base) MCG/ACT inhaler Inhale 2 puffs into the lungs every 4 (four) hours as needed for wheezing or shortness of breath.  Marland Kitchen apixaban (ELIQUIS) 5 MG TABS tablet Take 1 tablet (5 mg total) by mouth 2 (two) times daily.  Marland Kitchen atorvastatin (LIPITOR) 40 MG tablet Take 1 tablet (40 mg total) by mouth daily. (Patient taking differently: Take 40 mg by mouth at bedtime.)  . azelastine (OPTIVAR) 0.05 % ophthalmic solution INSTILL 1 DROP IN BOTH EYES TWICE DAILY AS NEEDED FOR ITCHY EYES (Patient not taking: Reported on 06/14/2020)  . bacitracin ointment Apply topically 2 (two) times daily.  . cetirizine (ZYRTEC) 10 MG tablet TAKE 1 TABLET(10 MG) BY MOUTH DAILY (Patient taking differently: Take 10 mg by mouth daily.)  . diclofenac sodium (VOLTAREN) 1 % GEL APPLY 2 GRAMS EXTERNALLY TO THE AFFECTED AREA FOUR TIMES DAILY (Patient taking differently: Apply 2 g topically 4 (four) times daily as needed (arthritis pain -  knees, shoulder and thighs).)  . diltiazem (CARDIZEM CD) 120 MG 24 hr capsule TAKE 1 CAPSULE BY MOUTH ONCE DAILY (Patient taking differently: Take 120 mg by mouth daily.)  . ferrous sulfate 325 (65 FE) MG tablet Take 1 tablet (325 mg total) by mouth every other day.  . flecainide (TAMBOCOR) 100 MG tablet TAKE 1 TABLET BY MOUTH TWICE DAILY  (Patient taking differently: Take 100 mg by mouth 2 (two) times daily.)  . fluticasone (FLONASE) 50 MCG/ACT nasal spray Place 1 spray into both nostrils daily.  . furosemide (LASIX) 40 MG tablet Take 1.5 tablets (60 mg total) by mouth daily.  Marland Kitchen gabapentin (NEURONTIN) 100 MG capsule Take 2 capsules (200 mg total) by mouth 3 (three) times daily.  Marland Kitchen guaiFENesin (ROBITUSSIN) 100 MG/5ML SOLN Take 5 mLs (100 mg total) by mouth every 12 (twelve) hours as needed for cough or to loosen phlegm.  . hydrocortisone cream 1 % Apply topically 2 (two) times daily.  . hydrOXYzine (VISTARIL) 25 MG capsule TAKE 1 CAPSULE(25 MG) BY MOUTH DAILY AS NEEDED FOR ANXIETY (Patient taking differently: Take 25 mg by mouth daily as needed for anxiety.)  . ipratropium (ATROVENT) 0.06 % nasal spray Place 2 sprays into both nostrils 4 (four) times daily.  Marland Kitchen ipratropium-albuterol (DUONEB) 0.5-2.5 (3) MG/3ML SOLN USE 3 ML VIA NEBULIZER EVERY 12 HOURS AS NEEDED FOR SHORTNESS OF BREATH OR WHEEZING  . Melatonin 3 MG TABS Take 1 tablet (3 mg total) by mouth at bedtime as needed (sleep). (Patient taking differently: Take 3 mg by mouth at bedtime.)  . metoprolol tartrate (LOPRESSOR) 25 MG tablet TAKE 1/2 TABLET(12.5 MG) BY MOUTH TWICE DAILY (Patient taking differently: Take 12.5 mg by mouth 2 (two) times daily.)  . Multiple Vitamin (MULTIVITAMIN) tablet Take 1 tablet by mouth every evening.   Marland Kitchen omeprazole (PRILOSEC) 20 MG capsule Take 1 capsule (20 mg total) by mouth daily.  . OXYGEN Inhale 6-7 L/min into the lungs continuous.   . polyethylene glycol (MIRALAX / GLYCOLAX) 17 g packet Take 17 g by mouth daily as needed for mild constipation or moderate constipation.  . potassium chloride (KLOR-CON) 10 MEQ tablet TAKE 2 TABLETS(20 MEQ) BY MOUTH DAILY (Patient taking differently: Take 20 mEq by mouth daily.)  . PRESCRIPTION MEDICATION See admin instructions. [employer]-  At bedtime and during any time of rest  .  venlafaxine (EFFEXOR) 37.5 MG tablet Take 1 tablet (37.5 mg total) by mouth daily with breakfast. Take with a 150mg  ER capsule every morning for a total of 187.5mg  (Patient taking differently: Take 37.5 mg by mouth daily. Take with a 150mg  ER capsule every morning for a total of 187.5mg )  . venlafaxine XR (EFFEXOR-XR) 150 MG 24 hr capsule Take 1 capsule (150 mg total) by mouth daily with breakfast. Take with a 37.5 mg tablet every morning (Patient taking differently: Take 150 mg by mouth daily. Take with a 37.5mg  tablet every morning for a total of 187.5mg )   No facility-administered encounter medications on file as of 08/31/2020.    Patient Active Problem List   Diagnosis Date Noted  . Normocytic anemia 06/22/2020  . Difficulty using biphasic positive airway pressure (BiPAP) machine 06/22/2020  . Severe comorbid illness   . Altered mental status   . Post-nasal drip 05/19/2020  . Frequent falls 05/06/2020  . Acute exacerbation of CHF (congestive heart failure) ([place]) 03/12/2020  . Community acquired pneumonia   . Acute respiratory failure with hypoxia ([place]) 02/06/2020  . SOB (  shortness of breath) 11/24/2019  . Respiratory distress   . Respiratory failure, acute-on-chronic ([place]) 11/09/2019  . Seasonal allergies 10/03/2019  . Debility 08/02/2019  . Hypertension associated with diabetes ([place])   . Weight gain due to medication 06/05/2019  . Acute on chronic heart failure with preserved ejection fraction ([place]) 03/14/2019  . Acquired thrombophilia ([place]) due to A-Fib 03/07/2019  . Heart failure with preserved ejection fraction ([place]) 03/06/2019  . Obesity hypoventilation syndrome ([place]) 03/06/2019  . Lower GI bleed   . COPD exacerbation ([place]) 02/26/2019  . Urge incontinence 01/27/2019  . Shortness of breath 08/29/2018  . OSA (obstructive sleep apnea) 07/05/2018  . Respiratory failure ([place]) 07/05/2018  . Severe episode of recurrent major depressive disorder, without psychotic features ([place])   .  MDD (major depressive disorder), recurrent episode, severe ([place]) 09/07/2015  . Atrial flutter ([place]) 07/29/2015  . Asthma 11/12/2014  . History of hypertension 11/05/2014  . Paroxysmal atrial fibrillation ([place]) 11/03/2014  . Chronic anticoagulation 11/03/2014  . Major depressive disorder 11/03/2014  . History of pulmonary embolus (PE) 11/03/2014  . MDD (major depressive disorder), recurrent severe, without psychosis ([place]) 03/05/2014  . Hot flashes 04/22/2011  . OVERACTIVE BLADDER 04/18/2008  . Osteoarthritis of both knees 04/18/2008  . Osteoarthritis involving multiple joints on both sides of body 09/14/2007  . Morbid obesity ([place]) 08/24/2006  . RESTLESS LEGS SYNDROME 08/24/2006  . HYPERTENSION, BENIGN SYSTEMIC 08/24/2006  . RHINITIS, ALLERGIC 08/24/2006  . GASTROESOPHAGEAL REFLUX, NO ESOPHAGITIS 08/24/2006    Conditions to be addressed/monitored: DME  Care Plan : RN Case Manager  Updates made by Lazaro Arms, RN since 08/31/2020 12:00 AM  Problem: Quality of Life DME   Priority: High  Onset Date: 08/28/2020  Goal: Quality of Life Maintained for the patient with Copd and Acute on Chrronic hypercapnic respiratory failure   Start Date: 08/28/2020  Expected End Date: 09/04/2020  Priority: High  Note:   . Care Coordination needs related to needed DME/Supplies in a patient with COPD and Acute on chronic hypoxic hypercapnic respiratory failure . Patient called RNCM reporting that she had been release from the [facility] and sent to [facility] on [address] in [place].  She did not have her medications nor her DME Equipment and was afraid she would end up back in the hospital.  Nurse Case Manager Clinical Goal(s):  Marland Kitchen Over the next 5 days, patient will verbalize understanding of plan to obtain needed DME.  . Over the next 14 days, patient will work with care guide and/or [employer] to address needs related to DME needs. . patient will verbalize understanding of plan for  to receive her medications and DME equipment  Interventions:  . Inter-disciplinary care team collaboration (see longitudinal plan of care) . Collaborated with Miquel Dunn , Skeet Latch  and Darlina Guys regarding DME Equipment via [employer]. They worked diligently to help Mrs Selph get her situation taken care of. . Discussed plans with patient for ongoing care management follow up and provided patient with direct contact information for care management team . RNCM had the patient to call [facility] to make sure they were working on her 20 medications that needed to be filled . Spoke with the patient today  08/31/20 and she states that she is in the hospital in [place].  She has been there since 2 am Saturday 08/29/20 morning. She states that she was brought a concentrator with the ability to refill her tanks. She reports she has  her other equipment except for her bariatric bed.  She said that [facility] was working on her meds and still had 2 to refill.  The hospital is helping her with the bed.  She states that she will call to update me on her progress.  Patient Goals/Self Care Activities:  . Patient will self administer medications as prescribed . Patient will call pharmacy for medication refills . Patient will call provider office for new concerns or questions . Patient will follow up on DME equipment as she needs to      [place], BSN, [place] Phone: [phone] I Fax: [phone]

## 2020-08-31 NOTE — Patient Instructions (Signed)
Visit Information  Ms. Kasper  it was nice speaking with you. Please call me directly [phone] if you have questions about the goals we discussed.  Goals Addressed              This Visit's Progress   .  I need to follow up on DME (pt-stated)        Timeframe:  Short-Term Goal Priority:  High Start Date:   08/28/20                          Expected End Date:  09/24/20                   Patient Goals/Self Care Activities:  . Patient will self administer medications as prescribed . Patient will call pharmacy for medication refills . Patient will call provider office for new concerns or questions . Patient will follow up on DME equipment as she needs to        The patient verbalized understanding of instructions, educational materials, and care plan provided today and declined offer to receive copy of patient instructions, educational materials, and care plan.   Follow up Plan: Patient would like continued follow-up.  CCM RNCM will outreach to the patient within the next 14 days.. Patient will call office if needed prior to next encounter  Lazaro Arms, RN

## 2020-09-01 DIAGNOSIS — J9622 Acute and chronic respiratory failure with hypercapnia: Secondary | ICD-10-CM | POA: Diagnosis not present

## 2020-09-01 DIAGNOSIS — J449 Chronic obstructive pulmonary disease, unspecified: Secondary | ICD-10-CM | POA: Diagnosis not present

## 2020-09-02 DIAGNOSIS — J9622 Acute and chronic respiratory failure with hypercapnia: Secondary | ICD-10-CM | POA: Diagnosis not present

## 2020-09-02 DIAGNOSIS — J449 Chronic obstructive pulmonary disease, unspecified: Secondary | ICD-10-CM | POA: Diagnosis not present

## 2020-09-03 DIAGNOSIS — J9622 Acute and chronic respiratory failure with hypercapnia: Secondary | ICD-10-CM | POA: Diagnosis not present

## 2020-09-03 DIAGNOSIS — J449 Chronic obstructive pulmonary disease, unspecified: Secondary | ICD-10-CM | POA: Diagnosis not present

## 2020-09-04 DIAGNOSIS — J9622 Acute and chronic respiratory failure with hypercapnia: Secondary | ICD-10-CM | POA: Diagnosis not present

## 2020-09-04 DIAGNOSIS — J449 Chronic obstructive pulmonary disease, unspecified: Secondary | ICD-10-CM | POA: Diagnosis not present

## 2020-09-05 DIAGNOSIS — J441 Chronic obstructive pulmonary disease with (acute) exacerbation: Secondary | ICD-10-CM | POA: Diagnosis not present

## 2020-09-05 DIAGNOSIS — J9622 Acute and chronic respiratory failure with hypercapnia: Secondary | ICD-10-CM | POA: Diagnosis not present

## 2020-09-05 DIAGNOSIS — J449 Chronic obstructive pulmonary disease, unspecified: Secondary | ICD-10-CM | POA: Diagnosis not present

## 2020-09-06 DIAGNOSIS — J9622 Acute and chronic respiratory failure with hypercapnia: Secondary | ICD-10-CM | POA: Diagnosis not present

## 2020-09-06 DIAGNOSIS — J9611 Chronic respiratory failure with hypoxia: Secondary | ICD-10-CM | POA: Diagnosis not present

## 2020-09-06 DIAGNOSIS — J449 Chronic obstructive pulmonary disease, unspecified: Secondary | ICD-10-CM | POA: Diagnosis not present

## 2020-09-07 DIAGNOSIS — J449 Chronic obstructive pulmonary disease, unspecified: Secondary | ICD-10-CM | POA: Diagnosis not present

## 2020-09-07 DIAGNOSIS — J9622 Acute and chronic respiratory failure with hypercapnia: Secondary | ICD-10-CM | POA: Diagnosis not present

## 2020-09-08 DIAGNOSIS — J9622 Acute and chronic respiratory failure with hypercapnia: Secondary | ICD-10-CM | POA: Diagnosis not present

## 2020-09-08 DIAGNOSIS — J449 Chronic obstructive pulmonary disease, unspecified: Secondary | ICD-10-CM | POA: Diagnosis not present

## 2020-09-09 DIAGNOSIS — J9622 Acute and chronic respiratory failure with hypercapnia: Secondary | ICD-10-CM | POA: Diagnosis not present

## 2020-09-09 DIAGNOSIS — J449 Chronic obstructive pulmonary disease, unspecified: Secondary | ICD-10-CM | POA: Diagnosis not present

## 2020-09-10 DIAGNOSIS — J9622 Acute and chronic respiratory failure with hypercapnia: Secondary | ICD-10-CM | POA: Diagnosis not present

## 2020-09-10 DIAGNOSIS — J449 Chronic obstructive pulmonary disease, unspecified: Secondary | ICD-10-CM | POA: Diagnosis not present

## 2020-09-11 DIAGNOSIS — J449 Chronic obstructive pulmonary disease, unspecified: Secondary | ICD-10-CM | POA: Diagnosis not present

## 2020-09-11 DIAGNOSIS — J9622 Acute and chronic respiratory failure with hypercapnia: Secondary | ICD-10-CM | POA: Diagnosis not present

## 2020-09-12 DIAGNOSIS — J9622 Acute and chronic respiratory failure with hypercapnia: Secondary | ICD-10-CM | POA: Diagnosis not present

## 2020-09-12 DIAGNOSIS — J449 Chronic obstructive pulmonary disease, unspecified: Secondary | ICD-10-CM | POA: Diagnosis not present

## 2020-09-13 DIAGNOSIS — J9622 Acute and chronic respiratory failure with hypercapnia: Secondary | ICD-10-CM | POA: Diagnosis not present

## 2020-09-13 DIAGNOSIS — J449 Chronic obstructive pulmonary disease, unspecified: Secondary | ICD-10-CM | POA: Diagnosis not present

## 2020-09-14 DIAGNOSIS — J449 Chronic obstructive pulmonary disease, unspecified: Secondary | ICD-10-CM | POA: Diagnosis not present

## 2020-09-14 DIAGNOSIS — J9622 Acute and chronic respiratory failure with hypercapnia: Secondary | ICD-10-CM | POA: Diagnosis not present

## 2020-09-15 DIAGNOSIS — J9622 Acute and chronic respiratory failure with hypercapnia: Secondary | ICD-10-CM | POA: Diagnosis not present

## 2020-09-15 DIAGNOSIS — J449 Chronic obstructive pulmonary disease, unspecified: Secondary | ICD-10-CM | POA: Diagnosis not present

## 2020-09-16 DIAGNOSIS — J449 Chronic obstructive pulmonary disease, unspecified: Secondary | ICD-10-CM | POA: Diagnosis not present

## 2020-09-16 DIAGNOSIS — J9622 Acute and chronic respiratory failure with hypercapnia: Secondary | ICD-10-CM | POA: Diagnosis not present

## 2020-09-17 DIAGNOSIS — J449 Chronic obstructive pulmonary disease, unspecified: Secondary | ICD-10-CM | POA: Diagnosis not present

## 2020-09-17 DIAGNOSIS — J9622 Acute and chronic respiratory failure with hypercapnia: Secondary | ICD-10-CM | POA: Diagnosis not present

## 2020-09-18 DIAGNOSIS — J9601 Acute respiratory failure with hypoxia: Secondary | ICD-10-CM | POA: Diagnosis not present

## 2020-09-18 DIAGNOSIS — E1159 Type 2 diabetes mellitus with other circulatory complications: Secondary | ICD-10-CM | POA: Diagnosis not present

## 2020-09-18 DIAGNOSIS — J449 Chronic obstructive pulmonary disease, unspecified: Secondary | ICD-10-CM | POA: Diagnosis not present

## 2020-09-18 DIAGNOSIS — J9622 Acute and chronic respiratory failure with hypercapnia: Secondary | ICD-10-CM | POA: Diagnosis not present

## 2020-09-19 DIAGNOSIS — J449 Chronic obstructive pulmonary disease, unspecified: Secondary | ICD-10-CM | POA: Diagnosis not present

## 2020-09-19 DIAGNOSIS — J9622 Acute and chronic respiratory failure with hypercapnia: Secondary | ICD-10-CM | POA: Diagnosis not present

## 2020-09-20 DIAGNOSIS — J449 Chronic obstructive pulmonary disease, unspecified: Secondary | ICD-10-CM | POA: Diagnosis not present

## 2020-09-20 DIAGNOSIS — J9622 Acute and chronic respiratory failure with hypercapnia: Secondary | ICD-10-CM | POA: Diagnosis not present

## 2020-09-21 DIAGNOSIS — J449 Chronic obstructive pulmonary disease, unspecified: Secondary | ICD-10-CM | POA: Diagnosis not present

## 2020-09-21 DIAGNOSIS — J9622 Acute and chronic respiratory failure with hypercapnia: Secondary | ICD-10-CM | POA: Diagnosis not present

## 2020-09-21 IMAGING — DX DG CHEST 1V PORT
1 series · 1 of 1 positions shown · non-contrast
Comparison: 07/01/2018

CLINICAL DATA: Respiratory distress for 1 week

EXAM:
PORTABLE CHEST 1 VIEW

[chest ap]
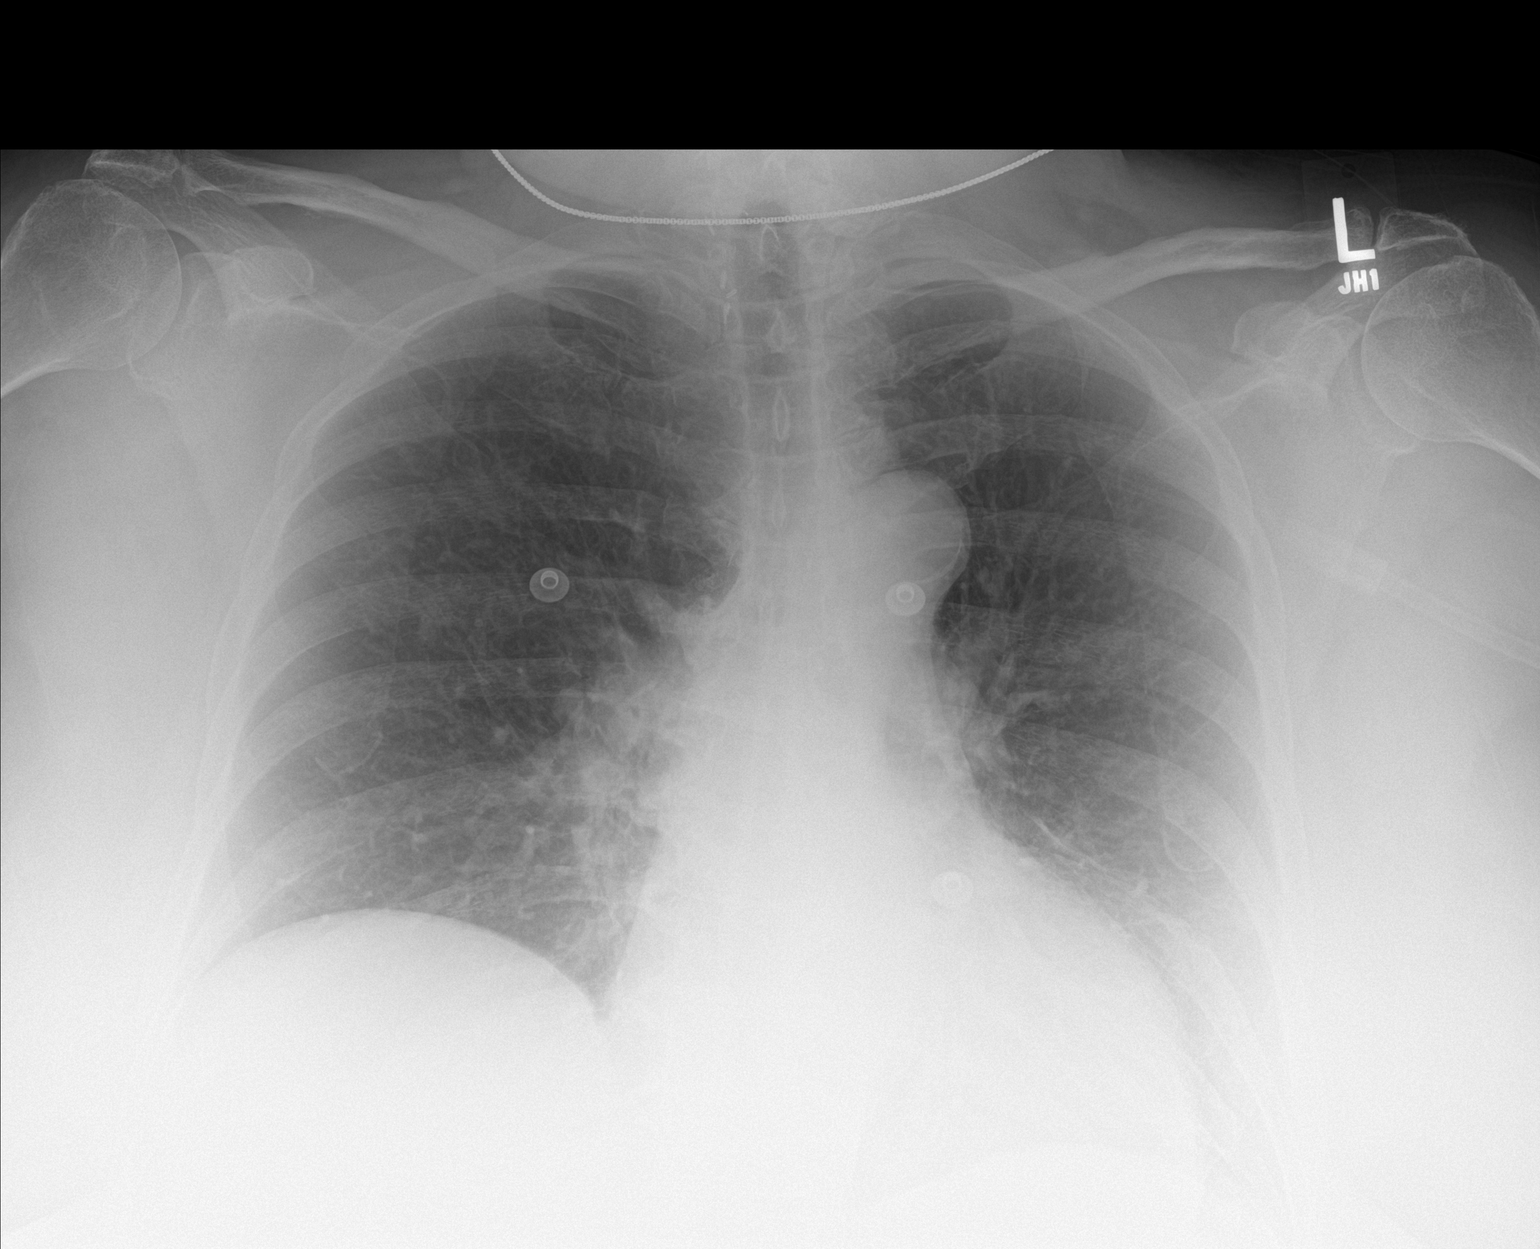

[1 of 1 positions shown; findings below may reference images not displayed]

FINDINGS: Cardiac shadow is stable. Aortic calcifications are again seen.
Lungs are well aerated bilaterally. No focal infiltrate or sizable
effusion is seen. Previously noted infiltrates have resolved. No
bony abnormality is seen.
IMPRESSION: Clearing of previously seen basilar infiltrates.

## 2020-09-22 ENCOUNTER — Ambulatory Visit: Payer: Medicare HMO

## 2020-09-22 DIAGNOSIS — J9622 Acute and chronic respiratory failure with hypercapnia: Secondary | ICD-10-CM | POA: Diagnosis not present

## 2020-09-22 DIAGNOSIS — J449 Chronic obstructive pulmonary disease, unspecified: Secondary | ICD-10-CM | POA: Diagnosis not present

## 2020-09-23 DIAGNOSIS — J449 Chronic obstructive pulmonary disease, unspecified: Secondary | ICD-10-CM | POA: Diagnosis not present

## 2020-09-23 DIAGNOSIS — J9622 Acute and chronic respiratory failure with hypercapnia: Secondary | ICD-10-CM | POA: Diagnosis not present

## 2020-09-23 NOTE — Chronic Care Management (AMB) (Signed)
   09/23/2020  [name] Nov 09, 1955 [id number]   Care Management     RN Outreach Note  09/23/2020 Name: [name] White MRN: [id number] DOB: 07/22/1955  [name] White is a 65 y.o. year old female who is a primary care patient of Richarda Osmond, [place] received a call from [place] [facility] today by phone to discuss her situation and what should she do.  The patient is currently in [facility].  She states that they are trying to find her a rehab center for her to go or she wants to see if she can be transferred back to [place].  I advised [name] White that she will need to work with the Education officer, museum and Nurse Case Manger that they have in the hospital there to help find her placement and also talk with her physician about her situation.  She stated that she will talk with them to see what are her options.  Follow Up Plan: RNCM will remain available for 30 days.  If no further needs are assessed at this time RNCM will be removed from care team.  Lazaro Arms RN, BSN, [facility] Care Management Coordinator [place] Phone: [phone] I Fax: [phone]

## 2020-09-24 ENCOUNTER — Ambulatory Visit: Payer: Self-pay | Admitting: Licensed Clinical Social Worker

## 2020-09-24 DIAGNOSIS — J9622 Acute and chronic respiratory failure with hypercapnia: Secondary | ICD-10-CM | POA: Diagnosis not present

## 2020-09-24 DIAGNOSIS — J449 Chronic obstructive pulmonary disease, unspecified: Secondary | ICD-10-CM | POA: Diagnosis not present

## 2020-09-24 NOTE — Chronic Care Management (AMB) (Signed)
  Care Management  Collaboration  Note  09/24/2020 Name: [name] White MRN: [id number] DOB: [birth year]  [name] White is a 65 y.o. year old female who is a primary care patient of [name] Osmond, [name] White. The CCM team was consulted reference care coordination needs.  Assessment: Patient [place] of state. Intervention: Patient was not interviewed or contacted during this encounter.  CCM LCSW collaborated with CCM RN.  Conducted brief assessment, recommendations and relevant information discussed.   Follow up Plan: No follow up scheduled with CCM team at this time. LCSW will disconnect from care team.  Please place new CCM referral is patient returns to [facility] and needs further intervention and support.   Collaboration with [name] Osmond, [name] White regarding development and update of comprehensive plan of care as evidenced by provider attestation and co-signature Review of patient past medical history, allergies, medications, and health status, including review of pertinent consultant reports was performed as part of comprehensive evaluation and provision of care management/care coordination services.   [name] White, [place] / [place]   [phone] 2:08 PM

## 2020-09-25 DIAGNOSIS — J449 Chronic obstructive pulmonary disease, unspecified: Secondary | ICD-10-CM | POA: Diagnosis not present

## 2020-09-25 DIAGNOSIS — J9622 Acute and chronic respiratory failure with hypercapnia: Secondary | ICD-10-CM | POA: Diagnosis not present

## 2020-09-26 DIAGNOSIS — J449 Chronic obstructive pulmonary disease, unspecified: Secondary | ICD-10-CM | POA: Diagnosis not present

## 2020-09-26 DIAGNOSIS — J9622 Acute and chronic respiratory failure with hypercapnia: Secondary | ICD-10-CM | POA: Diagnosis not present

## 2020-09-27 DIAGNOSIS — J449 Chronic obstructive pulmonary disease, unspecified: Secondary | ICD-10-CM | POA: Diagnosis not present

## 2020-09-27 DIAGNOSIS — J9622 Acute and chronic respiratory failure with hypercapnia: Secondary | ICD-10-CM | POA: Diagnosis not present

## 2020-09-28 DIAGNOSIS — J9622 Acute and chronic respiratory failure with hypercapnia: Secondary | ICD-10-CM | POA: Diagnosis not present

## 2020-09-28 DIAGNOSIS — J449 Chronic obstructive pulmonary disease, unspecified: Secondary | ICD-10-CM | POA: Diagnosis not present

## 2020-09-29 DIAGNOSIS — J9622 Acute and chronic respiratory failure with hypercapnia: Secondary | ICD-10-CM | POA: Diagnosis not present

## 2020-09-29 DIAGNOSIS — J449 Chronic obstructive pulmonary disease, unspecified: Secondary | ICD-10-CM | POA: Diagnosis not present

## 2020-09-30 DIAGNOSIS — J449 Chronic obstructive pulmonary disease, unspecified: Secondary | ICD-10-CM | POA: Diagnosis not present

## 2020-09-30 DIAGNOSIS — J9622 Acute and chronic respiratory failure with hypercapnia: Secondary | ICD-10-CM | POA: Diagnosis not present

## 2020-10-01 DIAGNOSIS — J449 Chronic obstructive pulmonary disease, unspecified: Secondary | ICD-10-CM | POA: Diagnosis not present

## 2020-10-01 DIAGNOSIS — J9622 Acute and chronic respiratory failure with hypercapnia: Secondary | ICD-10-CM | POA: Diagnosis not present

## 2020-10-02 DIAGNOSIS — J449 Chronic obstructive pulmonary disease, unspecified: Secondary | ICD-10-CM | POA: Diagnosis not present

## 2020-10-02 DIAGNOSIS — J9622 Acute and chronic respiratory failure with hypercapnia: Secondary | ICD-10-CM | POA: Diagnosis not present

## 2020-10-07 DIAGNOSIS — J9611 Chronic respiratory failure with hypoxia: Secondary | ICD-10-CM | POA: Diagnosis not present

## 2020-10-20 DIAGNOSIS — Z79899 Other long term (current) drug therapy: Secondary | ICD-10-CM | POA: Diagnosis not present

## 2020-10-20 DIAGNOSIS — I4891 Unspecified atrial fibrillation: Secondary | ICD-10-CM | POA: Diagnosis not present

## 2020-10-20 DIAGNOSIS — E662 Morbid (severe) obesity with alveolar hypoventilation: Secondary | ICD-10-CM | POA: Diagnosis not present

## 2020-10-20 DIAGNOSIS — I10 Essential (primary) hypertension: Secondary | ICD-10-CM | POA: Diagnosis not present

## 2020-10-20 DIAGNOSIS — J449 Chronic obstructive pulmonary disease, unspecified: Secondary | ICD-10-CM | POA: Diagnosis not present

## 2020-10-20 DIAGNOSIS — Z9981 Dependence on supplemental oxygen: Secondary | ICD-10-CM | POA: Diagnosis not present

## 2020-10-20 DIAGNOSIS — Z6841 Body Mass Index (BMI) 40.0 and over, adult: Secondary | ICD-10-CM | POA: Diagnosis not present

## 2020-10-20 DIAGNOSIS — J9622 Acute and chronic respiratory failure with hypercapnia: Secondary | ICD-10-CM | POA: Diagnosis not present

## 2020-10-20 DIAGNOSIS — I509 Heart failure, unspecified: Secondary | ICD-10-CM | POA: Diagnosis not present

## 2020-10-20 DIAGNOSIS — E785 Hyperlipidemia, unspecified: Secondary | ICD-10-CM | POA: Diagnosis not present

## 2020-10-20 DIAGNOSIS — J9621 Acute and chronic respiratory failure with hypoxia: Secondary | ICD-10-CM | POA: Diagnosis not present

## 2020-10-20 DIAGNOSIS — R2689 Other abnormalities of gait and mobility: Secondary | ICD-10-CM | POA: Diagnosis not present

## 2020-10-21 ENCOUNTER — Other Ambulatory Visit: Payer: Self-pay

## 2020-10-21 DIAGNOSIS — Z79899 Other long term (current) drug therapy: Secondary | ICD-10-CM | POA: Diagnosis not present

## 2020-10-21 NOTE — Patient Outreach (Signed)
[place] [facility]) Care Management  10/21/2020  [name] White [birth year] [id number]   Telephone Screen  Referral Date: 10/20/2020 Referral Source: [facility] Tier 4 Report Referral Reason: "high risk patient" Insurance: [facility]   Referral received. Upon EMR review noted that patient is in SNF and followed by embedded CM team.      Plan: RN CM will close referral at this time.   [name], RN,BSN,CCM [place] Management Telephonic Care Management Coordinator Direct Phone: [phone] Toll Free: [phone] Fax: [phone]

## 2020-10-22 ENCOUNTER — Ambulatory Visit: Payer: Medicare HMO | Admitting: Family Medicine

## 2020-10-22 NOTE — Progress Notes (Deleted)
   Subjective:   Patient ID: [name] White    DOB: 1955/12/14, 65 y.o. female   MRN: [id number]  [name] White is a 65 y.o. female with a history of HFpEF (EF 60-65% with severe LVH), a flutter/pAF, hot flashes, HTN, asthma, h/o CAP, OSA, allergic rhinitis, GERD, h/o lower GI bleed, RLS, osteoarthritis, overactive bladder, acquired thrombophilia, chronic anticoagulation, frequent falls, h/o PE, MDD, morbid obesity, normocytic anemia,  here for Hospital follow up  Acute on Chronic Hypoxic/Hypercapneic respiratory failure: Patient was admitted to [facility] on 12/18-12/31 for SOB and found to have acute on chronic respiratory failure. She improved with BiPAP use.  Home oxygen includes 6-7 L. She was discharged to SNF  Current medications include: Albuterol PRN, Ipratropium-Albuterol nebs PRN,  BiPAP setting: IPAP max 30 min 8, EPAP 6   Normocytic Anemia with Iron Deficiency: Found to have low Hgb and low iron/ferritin. Hgb 9.5 on discharge, bsaeline 9-11. Did not require transfusion. Started on iron supplement. Endorses compliance. Denies acute bleed.  Right Gluteal Wound:  Patient was noted to have a nontender wound on right buttock. NO signs of acute infection. She was treated with daily wound care. Discharged with instruction to use Bacitracin BID. Today she notes ***  Review of Systems:  Per HPI.   Objective:   There were no vitals taken for this visit. Vitals and nursing note reviewed.  General: pleasant ***, sitting comfortably in exam chair, well nourished, well developed, in no acute distress with non-toxic appearance HEENT: normocephalic, atraumatic, moist mucous membranes, oropharynx clear without erythema or exudate, TM normal bilaterally  Neck: supple, non-tender without lymphadenopathy CV: regular rate and rhythm without murmurs, rubs, or gallops, no lower extremity edema, 2+ radial and pedal pulses bilaterally Lungs: clear to auscultation bilaterally with normal work  of breathing on room air Resp: breathing comfortably on room air, speaking in full sentences Abdomen: soft, non-tender, non-distended, no masses or organomegaly palpable, normoactive bowel sounds Skin: warm, dry, no rashes or lesions Extremities: warm and well perfused, normal tone MSK: ROM grossly intact, strength intact, gait normal Neuro: Alert and oriented, speech normal  Assessment & Plan:   No problem-specific Assessment & Plan notes found for this encounter.  No orders of the defined types were placed in this encounter.  No orders of the defined types were placed in this encounter.   {    This will disappear when note is signed, click to select method of visit    :1}  Mina Marble, DO PGY-3, [place] [facility] 10/22/2020 7:45 AM

## 2020-10-25 DIAGNOSIS — I509 Heart failure, unspecified: Secondary | ICD-10-CM | POA: Diagnosis not present

## 2020-10-25 DIAGNOSIS — F419 Anxiety disorder, unspecified: Secondary | ICD-10-CM | POA: Diagnosis not present

## 2020-10-25 DIAGNOSIS — M62838 Other muscle spasm: Secondary | ICD-10-CM | POA: Diagnosis not present

## 2020-10-25 DIAGNOSIS — I10 Essential (primary) hypertension: Secondary | ICD-10-CM | POA: Diagnosis not present

## 2020-10-25 DIAGNOSIS — D649 Anemia, unspecified: Secondary | ICD-10-CM | POA: Diagnosis not present

## 2020-10-25 DIAGNOSIS — Z6841 Body Mass Index (BMI) 40.0 and over, adult: Secondary | ICD-10-CM | POA: Diagnosis not present

## 2020-10-25 DIAGNOSIS — R269 Unspecified abnormalities of gait and mobility: Secondary | ICD-10-CM | POA: Diagnosis not present

## 2020-10-25 DIAGNOSIS — K649 Unspecified hemorrhoids: Secondary | ICD-10-CM | POA: Diagnosis not present

## 2020-10-25 DIAGNOSIS — I4891 Unspecified atrial fibrillation: Secondary | ICD-10-CM | POA: Diagnosis not present

## 2020-10-25 DIAGNOSIS — J449 Chronic obstructive pulmonary disease, unspecified: Secondary | ICD-10-CM | POA: Diagnosis not present

## 2020-10-25 DIAGNOSIS — E662 Morbid (severe) obesity with alveolar hypoventilation: Secondary | ICD-10-CM | POA: Diagnosis not present

## 2020-10-25 DIAGNOSIS — J9621 Acute and chronic respiratory failure with hypoxia: Secondary | ICD-10-CM | POA: Diagnosis not present

## 2020-10-25 DIAGNOSIS — E785 Hyperlipidemia, unspecified: Secondary | ICD-10-CM | POA: Diagnosis not present

## 2020-10-25 DIAGNOSIS — J9611 Chronic respiratory failure with hypoxia: Secondary | ICD-10-CM | POA: Diagnosis not present

## 2020-10-25 DIAGNOSIS — R600 Localized edema: Secondary | ICD-10-CM | POA: Diagnosis not present

## 2020-10-25 DIAGNOSIS — R52 Pain, unspecified: Secondary | ICD-10-CM | POA: Diagnosis not present

## 2020-10-25 DIAGNOSIS — Z9981 Dependence on supplemental oxygen: Secondary | ICD-10-CM | POA: Diagnosis not present

## 2020-10-25 DIAGNOSIS — E119 Type 2 diabetes mellitus without complications: Secondary | ICD-10-CM | POA: Diagnosis not present

## 2020-10-25 DIAGNOSIS — K5900 Constipation, unspecified: Secondary | ICD-10-CM | POA: Diagnosis not present

## 2020-10-30 DIAGNOSIS — E785 Hyperlipidemia, unspecified: Secondary | ICD-10-CM | POA: Diagnosis not present

## 2020-10-30 DIAGNOSIS — F419 Anxiety disorder, unspecified: Secondary | ICD-10-CM | POA: Diagnosis not present

## 2020-10-30 DIAGNOSIS — R269 Unspecified abnormalities of gait and mobility: Secondary | ICD-10-CM | POA: Diagnosis not present

## 2020-11-02 DIAGNOSIS — K5900 Constipation, unspecified: Secondary | ICD-10-CM | POA: Diagnosis not present

## 2020-11-02 DIAGNOSIS — E785 Hyperlipidemia, unspecified: Secondary | ICD-10-CM | POA: Diagnosis not present

## 2020-11-02 DIAGNOSIS — R600 Localized edema: Secondary | ICD-10-CM | POA: Diagnosis not present

## 2020-11-02 DIAGNOSIS — K649 Unspecified hemorrhoids: Secondary | ICD-10-CM | POA: Diagnosis not present

## 2020-11-06 DIAGNOSIS — J9611 Chronic respiratory failure with hypoxia: Secondary | ICD-10-CM | POA: Diagnosis not present

## 2020-11-11 DIAGNOSIS — F419 Anxiety disorder, unspecified: Secondary | ICD-10-CM | POA: Diagnosis not present

## 2020-11-11 DIAGNOSIS — I4891 Unspecified atrial fibrillation: Secondary | ICD-10-CM | POA: Diagnosis not present

## 2020-11-11 DIAGNOSIS — R269 Unspecified abnormalities of gait and mobility: Secondary | ICD-10-CM | POA: Diagnosis not present

## 2020-11-11 DIAGNOSIS — J449 Chronic obstructive pulmonary disease, unspecified: Secondary | ICD-10-CM | POA: Diagnosis not present

## 2020-11-11 DIAGNOSIS — Z9981 Dependence on supplemental oxygen: Secondary | ICD-10-CM | POA: Diagnosis not present

## 2020-11-11 DIAGNOSIS — I509 Heart failure, unspecified: Secondary | ICD-10-CM | POA: Diagnosis not present

## 2020-11-11 DIAGNOSIS — E785 Hyperlipidemia, unspecified: Secondary | ICD-10-CM | POA: Diagnosis not present

## 2020-11-11 DIAGNOSIS — K5900 Constipation, unspecified: Secondary | ICD-10-CM | POA: Diagnosis not present

## 2020-11-12 DIAGNOSIS — F331 Major depressive disorder, recurrent, moderate: Secondary | ICD-10-CM | POA: Diagnosis not present

## 2020-11-12 DIAGNOSIS — F064 Anxiety disorder due to known physiological condition: Secondary | ICD-10-CM | POA: Diagnosis not present

## 2020-11-13 DIAGNOSIS — E785 Hyperlipidemia, unspecified: Secondary | ICD-10-CM | POA: Diagnosis not present

## 2020-11-13 DIAGNOSIS — K649 Unspecified hemorrhoids: Secondary | ICD-10-CM | POA: Diagnosis not present

## 2020-11-13 DIAGNOSIS — R600 Localized edema: Secondary | ICD-10-CM | POA: Diagnosis not present

## 2020-11-18 DIAGNOSIS — J449 Chronic obstructive pulmonary disease, unspecified: Secondary | ICD-10-CM | POA: Diagnosis not present

## 2020-11-18 DIAGNOSIS — E1159 Type 2 diabetes mellitus with other circulatory complications: Secondary | ICD-10-CM | POA: Diagnosis not present

## 2020-11-18 DIAGNOSIS — J9601 Acute respiratory failure with hypoxia: Secondary | ICD-10-CM | POA: Diagnosis not present

## 2020-11-20 DIAGNOSIS — F419 Anxiety disorder, unspecified: Secondary | ICD-10-CM | POA: Diagnosis not present

## 2020-11-20 DIAGNOSIS — E785 Hyperlipidemia, unspecified: Secondary | ICD-10-CM | POA: Diagnosis not present

## 2020-11-20 DIAGNOSIS — R269 Unspecified abnormalities of gait and mobility: Secondary | ICD-10-CM | POA: Diagnosis not present

## 2020-11-20 DIAGNOSIS — I509 Heart failure, unspecified: Secondary | ICD-10-CM | POA: Diagnosis not present

## 2020-11-20 DIAGNOSIS — I4891 Unspecified atrial fibrillation: Secondary | ICD-10-CM | POA: Diagnosis not present

## 2020-11-20 DIAGNOSIS — Z9981 Dependence on supplemental oxygen: Secondary | ICD-10-CM | POA: Diagnosis not present

## 2020-11-20 DIAGNOSIS — K5900 Constipation, unspecified: Secondary | ICD-10-CM | POA: Diagnosis not present

## 2020-11-20 DIAGNOSIS — J449 Chronic obstructive pulmonary disease, unspecified: Secondary | ICD-10-CM | POA: Diagnosis not present

## 2020-12-02 DIAGNOSIS — K649 Unspecified hemorrhoids: Secondary | ICD-10-CM | POA: Diagnosis not present

## 2020-12-02 DIAGNOSIS — R059 Cough, unspecified: Secondary | ICD-10-CM | POA: Diagnosis not present

## 2020-12-07 DIAGNOSIS — J9611 Chronic respiratory failure with hypoxia: Secondary | ICD-10-CM | POA: Diagnosis not present

## 2020-12-07 DIAGNOSIS — R059 Cough, unspecified: Secondary | ICD-10-CM | POA: Diagnosis not present

## 2020-12-07 DIAGNOSIS — E785 Hyperlipidemia, unspecified: Secondary | ICD-10-CM | POA: Diagnosis not present

## 2020-12-08 DIAGNOSIS — E119 Type 2 diabetes mellitus without complications: Secondary | ICD-10-CM | POA: Diagnosis not present

## 2020-12-08 DIAGNOSIS — D519 Vitamin B12 deficiency anemia, unspecified: Secondary | ICD-10-CM | POA: Diagnosis not present

## 2020-12-09 DIAGNOSIS — I509 Heart failure, unspecified: Secondary | ICD-10-CM | POA: Diagnosis not present

## 2020-12-09 DIAGNOSIS — R059 Cough, unspecified: Secondary | ICD-10-CM | POA: Diagnosis not present

## 2020-12-10 DIAGNOSIS — F064 Anxiety disorder due to known physiological condition: Secondary | ICD-10-CM | POA: Diagnosis not present

## 2020-12-10 DIAGNOSIS — F331 Major depressive disorder, recurrent, moderate: Secondary | ICD-10-CM | POA: Diagnosis not present

## 2020-12-14 NOTE — Progress Notes (Signed)
  Visit scheduled in error 

## 2020-12-17 DIAGNOSIS — I509 Heart failure, unspecified: Secondary | ICD-10-CM | POA: Diagnosis not present

## 2020-12-17 DIAGNOSIS — J449 Chronic obstructive pulmonary disease, unspecified: Secondary | ICD-10-CM | POA: Diagnosis not present

## 2020-12-17 DIAGNOSIS — D509 Iron deficiency anemia, unspecified: Secondary | ICD-10-CM | POA: Diagnosis not present

## 2020-12-18 DIAGNOSIS — Z79899 Other long term (current) drug therapy: Secondary | ICD-10-CM | POA: Diagnosis not present

## 2020-12-19 DIAGNOSIS — E1159 Type 2 diabetes mellitus with other circulatory complications: Secondary | ICD-10-CM | POA: Diagnosis not present

## 2020-12-19 DIAGNOSIS — J449 Chronic obstructive pulmonary disease, unspecified: Secondary | ICD-10-CM | POA: Diagnosis not present

## 2020-12-19 DIAGNOSIS — J9601 Acute respiratory failure with hypoxia: Secondary | ICD-10-CM | POA: Diagnosis not present

## 2020-12-23 DIAGNOSIS — L02419 Cutaneous abscess of limb, unspecified: Secondary | ICD-10-CM | POA: Diagnosis not present

## 2020-12-23 DIAGNOSIS — I509 Heart failure, unspecified: Secondary | ICD-10-CM | POA: Diagnosis not present

## 2020-12-25 DIAGNOSIS — J449 Chronic obstructive pulmonary disease, unspecified: Secondary | ICD-10-CM | POA: Diagnosis not present

## 2020-12-29 DIAGNOSIS — I509 Heart failure, unspecified: Secondary | ICD-10-CM | POA: Diagnosis not present

## 2020-12-30 DIAGNOSIS — I509 Heart failure, unspecified: Secondary | ICD-10-CM | POA: Diagnosis not present

## 2020-12-30 DIAGNOSIS — L02419 Cutaneous abscess of limb, unspecified: Secondary | ICD-10-CM | POA: Diagnosis not present

## 2020-12-30 DIAGNOSIS — R059 Cough, unspecified: Secondary | ICD-10-CM | POA: Diagnosis not present

## 2020-12-31 DIAGNOSIS — E119 Type 2 diabetes mellitus without complications: Secondary | ICD-10-CM | POA: Diagnosis not present

## 2021-01-01 DIAGNOSIS — D75839 Thrombocytosis, unspecified: Secondary | ICD-10-CM | POA: Diagnosis not present

## 2021-01-01 DIAGNOSIS — L02419 Cutaneous abscess of limb, unspecified: Secondary | ICD-10-CM | POA: Diagnosis not present

## 2021-01-01 DIAGNOSIS — I509 Heart failure, unspecified: Secondary | ICD-10-CM | POA: Diagnosis not present

## 2021-01-01 DIAGNOSIS — R059 Cough, unspecified: Secondary | ICD-10-CM | POA: Diagnosis not present

## 2021-01-04 DIAGNOSIS — D75839 Thrombocytosis, unspecified: Secondary | ICD-10-CM | POA: Diagnosis not present

## 2021-01-04 DIAGNOSIS — R202 Paresthesia of skin: Secondary | ICD-10-CM | POA: Diagnosis not present

## 2021-01-04 DIAGNOSIS — I509 Heart failure, unspecified: Secondary | ICD-10-CM | POA: Diagnosis not present

## 2021-01-05 DIAGNOSIS — D649 Anemia, unspecified: Secondary | ICD-10-CM | POA: Diagnosis not present

## 2021-01-05 DIAGNOSIS — D509 Iron deficiency anemia, unspecified: Secondary | ICD-10-CM | POA: Diagnosis not present

## 2021-01-05 DIAGNOSIS — D519 Vitamin B12 deficiency anemia, unspecified: Secondary | ICD-10-CM | POA: Diagnosis not present

## 2021-01-05 DIAGNOSIS — E119 Type 2 diabetes mellitus without complications: Secondary | ICD-10-CM | POA: Diagnosis not present

## 2021-01-06 DIAGNOSIS — J9611 Chronic respiratory failure with hypoxia: Secondary | ICD-10-CM | POA: Diagnosis not present

## 2021-01-07 DIAGNOSIS — R059 Cough, unspecified: Secondary | ICD-10-CM | POA: Diagnosis not present

## 2021-01-07 DIAGNOSIS — R202 Paresthesia of skin: Secondary | ICD-10-CM | POA: Diagnosis not present

## 2021-01-07 DIAGNOSIS — D72829 Elevated white blood cell count, unspecified: Secondary | ICD-10-CM | POA: Diagnosis not present

## 2021-01-12 DIAGNOSIS — R239 Unspecified skin changes: Secondary | ICD-10-CM | POA: Diagnosis not present

## 2021-01-12 DIAGNOSIS — D72829 Elevated white blood cell count, unspecified: Secondary | ICD-10-CM | POA: Diagnosis not present

## 2021-01-12 DIAGNOSIS — R059 Cough, unspecified: Secondary | ICD-10-CM | POA: Diagnosis not present

## 2021-01-12 DIAGNOSIS — I509 Heart failure, unspecified: Secondary | ICD-10-CM | POA: Diagnosis not present

## 2021-01-18 DIAGNOSIS — J449 Chronic obstructive pulmonary disease, unspecified: Secondary | ICD-10-CM | POA: Diagnosis not present

## 2021-01-18 DIAGNOSIS — J9601 Acute respiratory failure with hypoxia: Secondary | ICD-10-CM | POA: Diagnosis not present

## 2021-01-18 DIAGNOSIS — M79606 Pain in leg, unspecified: Secondary | ICD-10-CM | POA: Diagnosis not present

## 2021-01-18 DIAGNOSIS — E1159 Type 2 diabetes mellitus with other circulatory complications: Secondary | ICD-10-CM | POA: Diagnosis not present

## 2021-01-18 DIAGNOSIS — I509 Heart failure, unspecified: Secondary | ICD-10-CM | POA: Diagnosis not present

## 2021-01-18 DIAGNOSIS — F419 Anxiety disorder, unspecified: Secondary | ICD-10-CM | POA: Diagnosis not present

## 2021-01-18 DIAGNOSIS — B372 Candidiasis of skin and nail: Secondary | ICD-10-CM | POA: Diagnosis not present

## 2021-01-19 DIAGNOSIS — Z79899 Other long term (current) drug therapy: Secondary | ICD-10-CM | POA: Diagnosis not present

## 2021-01-19 DIAGNOSIS — B356 Tinea cruris: Secondary | ICD-10-CM | POA: Diagnosis not present

## 2021-01-19 DIAGNOSIS — R601 Generalized edema: Secondary | ICD-10-CM | POA: Diagnosis not present

## 2021-01-26 DIAGNOSIS — R059 Cough, unspecified: Secondary | ICD-10-CM | POA: Diagnosis not present

## 2021-01-26 DIAGNOSIS — I509 Heart failure, unspecified: Secondary | ICD-10-CM | POA: Diagnosis not present

## 2021-01-26 DIAGNOSIS — L98419 Non-pressure chronic ulcer of buttock with unspecified severity: Secondary | ICD-10-CM | POA: Diagnosis not present

## 2021-01-27 DIAGNOSIS — F331 Major depressive disorder, recurrent, moderate: Secondary | ICD-10-CM | POA: Diagnosis not present

## 2021-02-02 DIAGNOSIS — L98429 Non-pressure chronic ulcer of back with unspecified severity: Secondary | ICD-10-CM | POA: Diagnosis not present

## 2021-02-05 DIAGNOSIS — J441 Chronic obstructive pulmonary disease with (acute) exacerbation: Secondary | ICD-10-CM | POA: Diagnosis not present

## 2021-02-05 DIAGNOSIS — F331 Major depressive disorder, recurrent, moderate: Secondary | ICD-10-CM | POA: Diagnosis not present

## 2021-02-05 DIAGNOSIS — E662 Morbid (severe) obesity with alveolar hypoventilation: Secondary | ICD-10-CM | POA: Diagnosis not present

## 2021-02-05 DIAGNOSIS — M1991 Primary osteoarthritis, unspecified site: Secondary | ICD-10-CM | POA: Diagnosis not present

## 2021-02-05 DIAGNOSIS — I5042 Chronic combined systolic (congestive) and diastolic (congestive) heart failure: Secondary | ICD-10-CM | POA: Diagnosis not present

## 2021-02-05 DIAGNOSIS — M6281 Muscle weakness (generalized): Secondary | ICD-10-CM | POA: Diagnosis not present

## 2021-02-05 DIAGNOSIS — I501 Left ventricular failure: Secondary | ICD-10-CM | POA: Diagnosis not present

## 2021-02-05 DIAGNOSIS — I482 Chronic atrial fibrillation, unspecified: Secondary | ICD-10-CM | POA: Diagnosis not present

## 2021-02-05 DIAGNOSIS — R54 Age-related physical debility: Secondary | ICD-10-CM | POA: Diagnosis not present

## 2021-02-05 DIAGNOSIS — R059 Cough, unspecified: Secondary | ICD-10-CM | POA: Diagnosis not present

## 2021-02-05 DIAGNOSIS — K219 Gastro-esophageal reflux disease without esophagitis: Secondary | ICD-10-CM | POA: Diagnosis not present

## 2021-02-06 DIAGNOSIS — J9611 Chronic respiratory failure with hypoxia: Secondary | ICD-10-CM | POA: Diagnosis not present

## 2021-02-09 DIAGNOSIS — L853 Xerosis cutis: Secondary | ICD-10-CM | POA: Diagnosis not present

## 2021-02-10 DIAGNOSIS — F331 Major depressive disorder, recurrent, moderate: Secondary | ICD-10-CM | POA: Diagnosis not present

## 2021-02-11 DIAGNOSIS — M1991 Primary osteoarthritis, unspecified site: Secondary | ICD-10-CM | POA: Diagnosis not present

## 2021-02-11 DIAGNOSIS — E662 Morbid (severe) obesity with alveolar hypoventilation: Secondary | ICD-10-CM | POA: Diagnosis not present

## 2021-02-11 DIAGNOSIS — I5042 Chronic combined systolic (congestive) and diastolic (congestive) heart failure: Secondary | ICD-10-CM | POA: Diagnosis not present

## 2021-02-11 DIAGNOSIS — I482 Chronic atrial fibrillation, unspecified: Secondary | ICD-10-CM | POA: Diagnosis not present

## 2021-02-11 DIAGNOSIS — J449 Chronic obstructive pulmonary disease, unspecified: Secondary | ICD-10-CM | POA: Diagnosis not present

## 2021-02-11 DIAGNOSIS — M6281 Muscle weakness (generalized): Secondary | ICD-10-CM | POA: Diagnosis not present

## 2021-02-11 DIAGNOSIS — R54 Age-related physical debility: Secondary | ICD-10-CM | POA: Diagnosis not present

## 2021-02-11 DIAGNOSIS — F331 Major depressive disorder, recurrent, moderate: Secondary | ICD-10-CM | POA: Diagnosis not present

## 2021-02-11 DIAGNOSIS — I10 Essential (primary) hypertension: Secondary | ICD-10-CM | POA: Diagnosis not present

## 2021-02-15 DIAGNOSIS — R278 Other lack of coordination: Secondary | ICD-10-CM | POA: Diagnosis not present

## 2021-02-15 DIAGNOSIS — M6281 Muscle weakness (generalized): Secondary | ICD-10-CM | POA: Diagnosis not present

## 2021-02-18 DIAGNOSIS — Z6841 Body Mass Index (BMI) 40.0 and over, adult: Secondary | ICD-10-CM | POA: Diagnosis not present

## 2021-02-18 DIAGNOSIS — I10 Essential (primary) hypertension: Secondary | ICD-10-CM | POA: Diagnosis not present

## 2021-02-18 DIAGNOSIS — E662 Morbid (severe) obesity with alveolar hypoventilation: Secondary | ICD-10-CM | POA: Diagnosis not present

## 2021-02-18 DIAGNOSIS — B351 Tinea unguium: Secondary | ICD-10-CM | POA: Diagnosis not present

## 2021-02-18 DIAGNOSIS — R059 Cough, unspecified: Secondary | ICD-10-CM | POA: Diagnosis not present

## 2021-02-18 DIAGNOSIS — J9611 Chronic respiratory failure with hypoxia: Secondary | ICD-10-CM | POA: Diagnosis not present

## 2021-02-18 DIAGNOSIS — F331 Major depressive disorder, recurrent, moderate: Secondary | ICD-10-CM | POA: Diagnosis not present

## 2021-02-18 DIAGNOSIS — M2141 Flat foot [pes planus] (acquired), right foot: Secondary | ICD-10-CM | POA: Diagnosis not present

## 2021-02-18 DIAGNOSIS — L603 Nail dystrophy: Secondary | ICD-10-CM | POA: Diagnosis not present

## 2021-02-18 DIAGNOSIS — M79675 Pain in left toe(s): Secondary | ICD-10-CM | POA: Diagnosis not present

## 2021-02-18 DIAGNOSIS — E1159 Type 2 diabetes mellitus with other circulatory complications: Secondary | ICD-10-CM | POA: Diagnosis not present

## 2021-02-18 DIAGNOSIS — E785 Hyperlipidemia, unspecified: Secondary | ICD-10-CM | POA: Diagnosis not present

## 2021-02-18 DIAGNOSIS — I509 Heart failure, unspecified: Secondary | ICD-10-CM | POA: Diagnosis not present

## 2021-02-18 DIAGNOSIS — J9621 Acute and chronic respiratory failure with hypoxia: Secondary | ICD-10-CM | POA: Diagnosis not present

## 2021-02-18 DIAGNOSIS — J449 Chronic obstructive pulmonary disease, unspecified: Secondary | ICD-10-CM | POA: Diagnosis not present

## 2021-02-18 DIAGNOSIS — M2142 Flat foot [pes planus] (acquired), left foot: Secondary | ICD-10-CM | POA: Diagnosis not present

## 2021-02-18 DIAGNOSIS — M25532 Pain in left wrist: Secondary | ICD-10-CM | POA: Diagnosis not present

## 2021-02-18 DIAGNOSIS — R262 Difficulty in walking, not elsewhere classified: Secondary | ICD-10-CM | POA: Diagnosis not present

## 2021-02-18 DIAGNOSIS — I4891 Unspecified atrial fibrillation: Secondary | ICD-10-CM | POA: Diagnosis not present

## 2021-02-18 DIAGNOSIS — Z9981 Dependence on supplemental oxygen: Secondary | ICD-10-CM | POA: Diagnosis not present

## 2021-02-18 DIAGNOSIS — J9601 Acute respiratory failure with hypoxia: Secondary | ICD-10-CM | POA: Diagnosis not present

## 2021-02-18 DIAGNOSIS — M79674 Pain in right toe(s): Secondary | ICD-10-CM | POA: Diagnosis not present

## 2021-02-22 DIAGNOSIS — F331 Major depressive disorder, recurrent, moderate: Secondary | ICD-10-CM | POA: Diagnosis not present

## 2021-02-22 DIAGNOSIS — J449 Chronic obstructive pulmonary disease, unspecified: Secondary | ICD-10-CM | POA: Diagnosis not present

## 2021-02-22 DIAGNOSIS — M25532 Pain in left wrist: Secondary | ICD-10-CM | POA: Diagnosis not present

## 2021-02-22 DIAGNOSIS — I10 Essential (primary) hypertension: Secondary | ICD-10-CM | POA: Diagnosis not present

## 2021-02-25 DIAGNOSIS — R059 Cough, unspecified: Secondary | ICD-10-CM | POA: Diagnosis not present

## 2021-02-25 DIAGNOSIS — F331 Major depressive disorder, recurrent, moderate: Secondary | ICD-10-CM | POA: Diagnosis not present

## 2021-03-09 DIAGNOSIS — B351 Tinea unguium: Secondary | ICD-10-CM | POA: Diagnosis not present

## 2021-03-09 DIAGNOSIS — L603 Nail dystrophy: Secondary | ICD-10-CM | POA: Diagnosis not present

## 2021-03-09 DIAGNOSIS — J9611 Chronic respiratory failure with hypoxia: Secondary | ICD-10-CM | POA: Diagnosis not present

## 2021-03-09 DIAGNOSIS — M2142 Flat foot [pes planus] (acquired), left foot: Secondary | ICD-10-CM | POA: Diagnosis not present

## 2021-03-09 DIAGNOSIS — M79675 Pain in left toe(s): Secondary | ICD-10-CM | POA: Diagnosis not present

## 2021-03-09 DIAGNOSIS — M2141 Flat foot [pes planus] (acquired), right foot: Secondary | ICD-10-CM | POA: Diagnosis not present

## 2021-03-09 DIAGNOSIS — R262 Difficulty in walking, not elsewhere classified: Secondary | ICD-10-CM | POA: Diagnosis not present

## 2021-03-09 DIAGNOSIS — M79674 Pain in right toe(s): Secondary | ICD-10-CM | POA: Diagnosis not present

## 2021-03-16 ENCOUNTER — Other Ambulatory Visit: Payer: Self-pay

## 2021-03-16 DIAGNOSIS — F331 Major depressive disorder, recurrent, moderate: Secondary | ICD-10-CM | POA: Diagnosis not present

## 2021-03-16 NOTE — Patient Outreach (Signed)
[facility]) Care Management  03/16/2021  [name] White [birth year] [id number]   Patient on [employer] report as dischrged from [place] [facility] [place] on 03/14/21.  Telephone call to Rehab center [phone]. Patient remains there as a resident.    Plan: RN CM will close case.   Jone Baseman, RN, MSN [place] Management Care Management Coordinator Direct Line [phone] Cell [phone] Toll Free: [phone]  Fax: [phone]

## 2021-03-21 DIAGNOSIS — E1159 Type 2 diabetes mellitus with other circulatory complications: Secondary | ICD-10-CM | POA: Diagnosis not present

## 2021-03-21 DIAGNOSIS — J449 Chronic obstructive pulmonary disease, unspecified: Secondary | ICD-10-CM | POA: Diagnosis not present

## 2021-03-21 DIAGNOSIS — J9601 Acute respiratory failure with hypoxia: Secondary | ICD-10-CM | POA: Diagnosis not present

## 2021-03-22 DIAGNOSIS — J449 Chronic obstructive pulmonary disease, unspecified: Secondary | ICD-10-CM | POA: Diagnosis not present

## 2021-03-22 DIAGNOSIS — I10 Essential (primary) hypertension: Secondary | ICD-10-CM | POA: Diagnosis not present

## 2021-03-22 DIAGNOSIS — F331 Major depressive disorder, recurrent, moderate: Secondary | ICD-10-CM | POA: Diagnosis not present

## 2021-03-22 DIAGNOSIS — R0982 Postnasal drip: Secondary | ICD-10-CM | POA: Diagnosis not present

## 2021-03-30 DIAGNOSIS — L98429 Non-pressure chronic ulcer of back with unspecified severity: Secondary | ICD-10-CM | POA: Diagnosis not present

## 2021-04-01 DIAGNOSIS — F3341 Major depressive disorder, recurrent, in partial remission: Secondary | ICD-10-CM | POA: Diagnosis not present

## 2021-04-06 DIAGNOSIS — L98429 Non-pressure chronic ulcer of back with unspecified severity: Secondary | ICD-10-CM | POA: Diagnosis not present

## 2021-04-08 DIAGNOSIS — J9611 Chronic respiratory failure with hypoxia: Secondary | ICD-10-CM | POA: Diagnosis not present

## 2021-04-13 DIAGNOSIS — K219 Gastro-esophageal reflux disease without esophagitis: Secondary | ICD-10-CM | POA: Diagnosis not present

## 2021-04-13 DIAGNOSIS — J449 Chronic obstructive pulmonary disease, unspecified: Secondary | ICD-10-CM | POA: Diagnosis not present

## 2021-04-13 DIAGNOSIS — G4730 Sleep apnea, unspecified: Secondary | ICD-10-CM | POA: Diagnosis not present

## 2021-04-13 DIAGNOSIS — F064 Anxiety disorder due to known physiological condition: Secondary | ICD-10-CM | POA: Diagnosis not present

## 2021-04-13 DIAGNOSIS — E662 Morbid (severe) obesity with alveolar hypoventilation: Secondary | ICD-10-CM | POA: Diagnosis not present

## 2021-04-13 DIAGNOSIS — L98429 Non-pressure chronic ulcer of back with unspecified severity: Secondary | ICD-10-CM | POA: Diagnosis not present

## 2021-04-13 DIAGNOSIS — G9009 Other idiopathic peripheral autonomic neuropathy: Secondary | ICD-10-CM | POA: Diagnosis not present

## 2021-04-13 DIAGNOSIS — I482 Chronic atrial fibrillation, unspecified: Secondary | ICD-10-CM | POA: Diagnosis not present

## 2021-04-13 DIAGNOSIS — I5042 Chronic combined systolic (congestive) and diastolic (congestive) heart failure: Secondary | ICD-10-CM | POA: Diagnosis not present

## 2021-04-13 DIAGNOSIS — I10 Essential (primary) hypertension: Secondary | ICD-10-CM | POA: Diagnosis not present

## 2021-04-14 DIAGNOSIS — G894 Chronic pain syndrome: Secondary | ICD-10-CM | POA: Diagnosis not present

## 2021-04-19 DIAGNOSIS — L98429 Non-pressure chronic ulcer of back with unspecified severity: Secondary | ICD-10-CM | POA: Diagnosis not present

## 2021-04-20 DIAGNOSIS — J9601 Acute respiratory failure with hypoxia: Secondary | ICD-10-CM | POA: Diagnosis not present

## 2021-04-20 DIAGNOSIS — E1159 Type 2 diabetes mellitus with other circulatory complications: Secondary | ICD-10-CM | POA: Diagnosis not present

## 2021-04-20 DIAGNOSIS — J449 Chronic obstructive pulmonary disease, unspecified: Secondary | ICD-10-CM | POA: Diagnosis not present

## 2021-04-21 DIAGNOSIS — E662 Morbid (severe) obesity with alveolar hypoventilation: Secondary | ICD-10-CM | POA: Diagnosis not present

## 2021-04-21 DIAGNOSIS — K219 Gastro-esophageal reflux disease without esophagitis: Secondary | ICD-10-CM | POA: Diagnosis not present

## 2021-04-21 DIAGNOSIS — R0982 Postnasal drip: Secondary | ICD-10-CM | POA: Diagnosis not present

## 2021-04-21 DIAGNOSIS — I482 Chronic atrial fibrillation, unspecified: Secondary | ICD-10-CM | POA: Diagnosis not present

## 2021-04-21 DIAGNOSIS — Z7901 Long term (current) use of anticoagulants: Secondary | ICD-10-CM | POA: Diagnosis not present

## 2021-04-21 DIAGNOSIS — I5042 Chronic combined systolic (congestive) and diastolic (congestive) heart failure: Secondary | ICD-10-CM | POA: Diagnosis not present

## 2021-04-21 DIAGNOSIS — K649 Unspecified hemorrhoids: Secondary | ICD-10-CM | POA: Diagnosis not present

## 2021-04-21 DIAGNOSIS — Z7902 Long term (current) use of antithrombotics/antiplatelets: Secondary | ICD-10-CM | POA: Diagnosis not present

## 2021-04-27 DIAGNOSIS — L259 Unspecified contact dermatitis, unspecified cause: Secondary | ICD-10-CM | POA: Diagnosis not present

## 2021-04-30 DIAGNOSIS — Z23 Encounter for immunization: Secondary | ICD-10-CM | POA: Diagnosis not present

## 2021-04-30 DIAGNOSIS — M6281 Muscle weakness (generalized): Secondary | ICD-10-CM | POA: Diagnosis not present

## 2021-05-06 DIAGNOSIS — F3341 Major depressive disorder, recurrent, in partial remission: Secondary | ICD-10-CM | POA: Diagnosis not present

## 2021-05-09 DIAGNOSIS — J9611 Chronic respiratory failure with hypoxia: Secondary | ICD-10-CM | POA: Diagnosis not present

## 2021-05-11 DIAGNOSIS — L98499 Non-pressure chronic ulcer of skin of other sites with unspecified severity: Secondary | ICD-10-CM | POA: Diagnosis not present

## 2021-05-13 DIAGNOSIS — F331 Major depressive disorder, recurrent, moderate: Secondary | ICD-10-CM | POA: Diagnosis not present

## 2021-05-14 DIAGNOSIS — T7631XA Adult psychological abuse, suspected, initial encounter: Secondary | ICD-10-CM | POA: Diagnosis not present

## 2021-05-14 DIAGNOSIS — T7611XA Adult physical abuse, suspected, initial encounter: Secondary | ICD-10-CM | POA: Diagnosis not present

## 2021-05-17 DIAGNOSIS — I5042 Chronic combined systolic (congestive) and diastolic (congestive) heart failure: Secondary | ICD-10-CM | POA: Diagnosis not present

## 2021-05-17 DIAGNOSIS — I482 Chronic atrial fibrillation, unspecified: Secondary | ICD-10-CM | POA: Diagnosis not present

## 2021-05-17 DIAGNOSIS — Z7901 Long term (current) use of anticoagulants: Secondary | ICD-10-CM | POA: Diagnosis not present

## 2021-05-17 DIAGNOSIS — I10 Essential (primary) hypertension: Secondary | ICD-10-CM | POA: Diagnosis not present

## 2021-05-18 DIAGNOSIS — M6281 Muscle weakness (generalized): Secondary | ICD-10-CM | POA: Diagnosis not present

## 2021-05-18 DIAGNOSIS — L98419 Non-pressure chronic ulcer of buttock with unspecified severity: Secondary | ICD-10-CM | POA: Diagnosis not present

## 2021-05-18 DIAGNOSIS — Z23 Encounter for immunization: Secondary | ICD-10-CM | POA: Diagnosis not present

## 2021-05-19 DIAGNOSIS — T7631XD Adult psychological abuse, suspected, subsequent encounter: Secondary | ICD-10-CM | POA: Diagnosis not present

## 2021-05-19 DIAGNOSIS — G894 Chronic pain syndrome: Secondary | ICD-10-CM | POA: Diagnosis not present

## 2021-05-19 DIAGNOSIS — T7611XD Adult physical abuse, suspected, subsequent encounter: Secondary | ICD-10-CM | POA: Diagnosis not present

## 2021-05-19 DIAGNOSIS — F331 Major depressive disorder, recurrent, moderate: Secondary | ICD-10-CM | POA: Diagnosis not present

## 2021-05-21 DIAGNOSIS — J9611 Chronic respiratory failure with hypoxia: Secondary | ICD-10-CM | POA: Diagnosis not present

## 2021-05-21 DIAGNOSIS — J449 Chronic obstructive pulmonary disease, unspecified: Secondary | ICD-10-CM | POA: Diagnosis not present

## 2021-05-21 DIAGNOSIS — I480 Paroxysmal atrial fibrillation: Secondary | ICD-10-CM | POA: Diagnosis not present

## 2021-05-21 DIAGNOSIS — Z9981 Dependence on supplemental oxygen: Secondary | ICD-10-CM | POA: Diagnosis not present

## 2021-05-21 DIAGNOSIS — I509 Heart failure, unspecified: Secondary | ICD-10-CM | POA: Diagnosis not present

## 2021-05-21 DIAGNOSIS — L0231 Cutaneous abscess of buttock: Secondary | ICD-10-CM | POA: Diagnosis not present

## 2021-05-21 DIAGNOSIS — J9621 Acute and chronic respiratory failure with hypoxia: Secondary | ICD-10-CM | POA: Diagnosis not present

## 2021-05-21 DIAGNOSIS — Z9911 Dependence on respirator [ventilator] status: Secondary | ICD-10-CM | POA: Diagnosis not present

## 2021-05-21 DIAGNOSIS — R42 Dizziness and giddiness: Secondary | ICD-10-CM | POA: Diagnosis not present

## 2021-05-21 DIAGNOSIS — I110 Hypertensive heart disease with heart failure: Secondary | ICD-10-CM | POA: Diagnosis not present

## 2021-05-22 DIAGNOSIS — R58 Hemorrhage, not elsewhere classified: Secondary | ICD-10-CM | POA: Diagnosis not present

## 2021-05-22 DIAGNOSIS — L0231 Cutaneous abscess of buttock: Secondary | ICD-10-CM | POA: Diagnosis not present

## 2021-05-22 DIAGNOSIS — M79604 Pain in right leg: Secondary | ICD-10-CM | POA: Diagnosis not present

## 2021-05-22 DIAGNOSIS — R7881 Bacteremia: Secondary | ICD-10-CM | POA: Diagnosis not present

## 2021-05-22 DIAGNOSIS — L98419 Non-pressure chronic ulcer of buttock with unspecified severity: Secondary | ICD-10-CM | POA: Diagnosis not present

## 2021-05-22 DIAGNOSIS — Z6841 Body Mass Index (BMI) 40.0 and over, adult: Secondary | ICD-10-CM | POA: Diagnosis not present

## 2021-05-22 DIAGNOSIS — A419 Sepsis, unspecified organism: Secondary | ICD-10-CM | POA: Diagnosis not present

## 2021-05-22 DIAGNOSIS — I5030 Unspecified diastolic (congestive) heart failure: Secondary | ICD-10-CM | POA: Diagnosis not present

## 2021-05-22 DIAGNOSIS — B952 Enterococcus as the cause of diseases classified elsewhere: Secondary | ICD-10-CM | POA: Diagnosis not present

## 2021-05-22 DIAGNOSIS — A4151 Sepsis due to Escherichia coli [E. coli]: Secondary | ICD-10-CM | POA: Diagnosis not present

## 2021-05-22 DIAGNOSIS — J449 Chronic obstructive pulmonary disease, unspecified: Secondary | ICD-10-CM | POA: Diagnosis not present

## 2021-05-22 DIAGNOSIS — K612 Anorectal abscess: Secondary | ICD-10-CM | POA: Diagnosis not present

## 2021-05-22 DIAGNOSIS — M79605 Pain in left leg: Secondary | ICD-10-CM | POA: Diagnosis not present

## 2021-05-22 DIAGNOSIS — J9612 Chronic respiratory failure with hypercapnia: Secondary | ICD-10-CM | POA: Diagnosis not present

## 2021-05-22 DIAGNOSIS — I110 Hypertensive heart disease with heart failure: Secondary | ICD-10-CM | POA: Diagnosis not present

## 2021-05-22 DIAGNOSIS — J9621 Acute and chronic respiratory failure with hypoxia: Secondary | ICD-10-CM | POA: Diagnosis not present

## 2021-05-22 DIAGNOSIS — D179 Benign lipomatous neoplasm, unspecified: Secondary | ICD-10-CM | POA: Diagnosis not present

## 2021-05-22 DIAGNOSIS — L02215 Cutaneous abscess of perineum: Secondary | ICD-10-CM | POA: Diagnosis not present

## 2021-05-22 DIAGNOSIS — Z743 Need for continuous supervision: Secondary | ICD-10-CM | POA: Diagnosis not present

## 2021-05-22 DIAGNOSIS — Z8659 Personal history of other mental and behavioral disorders: Secondary | ICD-10-CM | POA: Diagnosis not present

## 2021-05-22 DIAGNOSIS — K769 Liver disease, unspecified: Secondary | ICD-10-CM | POA: Diagnosis not present

## 2021-05-22 DIAGNOSIS — E662 Morbid (severe) obesity with alveolar hypoventilation: Secondary | ICD-10-CM | POA: Diagnosis not present

## 2021-05-22 DIAGNOSIS — F329 Major depressive disorder, single episode, unspecified: Secondary | ICD-10-CM | POA: Diagnosis not present

## 2021-05-22 DIAGNOSIS — D72829 Elevated white blood cell count, unspecified: Secondary | ICD-10-CM | POA: Diagnosis not present

## 2021-05-22 DIAGNOSIS — B999 Unspecified infectious disease: Secondary | ICD-10-CM | POA: Diagnosis not present

## 2021-05-22 DIAGNOSIS — R069 Unspecified abnormalities of breathing: Secondary | ICD-10-CM | POA: Diagnosis not present

## 2021-05-22 DIAGNOSIS — I10 Essential (primary) hypertension: Secondary | ICD-10-CM | POA: Diagnosis not present

## 2021-05-22 DIAGNOSIS — J9601 Acute respiratory failure with hypoxia: Secondary | ICD-10-CM | POA: Diagnosis not present

## 2021-05-22 DIAGNOSIS — G4733 Obstructive sleep apnea (adult) (pediatric): Secondary | ICD-10-CM | POA: Diagnosis not present

## 2021-05-22 DIAGNOSIS — Z7901 Long term (current) use of anticoagulants: Secondary | ICD-10-CM | POA: Diagnosis not present

## 2021-05-22 DIAGNOSIS — Z8679 Personal history of other diseases of the circulatory system: Secondary | ICD-10-CM | POA: Diagnosis not present

## 2021-05-22 DIAGNOSIS — I482 Chronic atrial fibrillation, unspecified: Secondary | ICD-10-CM | POA: Diagnosis not present

## 2021-05-22 DIAGNOSIS — B9689 Other specified bacterial agents as the cause of diseases classified elsewhere: Secondary | ICD-10-CM | POA: Diagnosis not present

## 2021-05-22 DIAGNOSIS — J441 Chronic obstructive pulmonary disease with (acute) exacerbation: Secondary | ICD-10-CM | POA: Diagnosis not present

## 2021-05-22 DIAGNOSIS — R531 Weakness: Secondary | ICD-10-CM | POA: Diagnosis not present

## 2021-05-22 DIAGNOSIS — M7989 Other specified soft tissue disorders: Secondary | ICD-10-CM | POA: Diagnosis not present

## 2021-05-22 DIAGNOSIS — I5032 Chronic diastolic (congestive) heart failure: Secondary | ICD-10-CM | POA: Diagnosis not present

## 2021-05-22 DIAGNOSIS — R053 Chronic cough: Secondary | ICD-10-CM | POA: Diagnosis not present

## 2021-05-22 DIAGNOSIS — N390 Urinary tract infection, site not specified: Secondary | ICD-10-CM | POA: Diagnosis not present

## 2021-05-22 DIAGNOSIS — I5020 Unspecified systolic (congestive) heart failure: Secondary | ICD-10-CM | POA: Diagnosis not present

## 2021-05-22 DIAGNOSIS — K610 Anal abscess: Secondary | ICD-10-CM | POA: Diagnosis not present

## 2021-05-22 DIAGNOSIS — R262 Difficulty in walking, not elsewhere classified: Secondary | ICD-10-CM | POA: Diagnosis not present

## 2021-05-22 DIAGNOSIS — I480 Paroxysmal atrial fibrillation: Secondary | ICD-10-CM | POA: Diagnosis not present

## 2021-05-23 DIAGNOSIS — K610 Anal abscess: Secondary | ICD-10-CM | POA: Diagnosis not present

## 2021-05-23 DIAGNOSIS — B9689 Other specified bacterial agents as the cause of diseases classified elsewhere: Secondary | ICD-10-CM | POA: Diagnosis not present

## 2021-05-23 DIAGNOSIS — B952 Enterococcus as the cause of diseases classified elsewhere: Secondary | ICD-10-CM | POA: Diagnosis not present

## 2021-05-25 DIAGNOSIS — I482 Chronic atrial fibrillation, unspecified: Secondary | ICD-10-CM | POA: Diagnosis not present

## 2021-05-25 DIAGNOSIS — J9601 Acute respiratory failure with hypoxia: Secondary | ICD-10-CM | POA: Diagnosis not present

## 2021-05-25 DIAGNOSIS — K612 Anorectal abscess: Secondary | ICD-10-CM | POA: Diagnosis not present

## 2021-05-26 DIAGNOSIS — K612 Anorectal abscess: Secondary | ICD-10-CM | POA: Diagnosis not present

## 2021-05-26 DIAGNOSIS — R262 Difficulty in walking, not elsewhere classified: Secondary | ICD-10-CM | POA: Diagnosis not present

## 2021-05-26 DIAGNOSIS — Z7901 Long term (current) use of anticoagulants: Secondary | ICD-10-CM | POA: Diagnosis not present

## 2021-05-26 DIAGNOSIS — J9601 Acute respiratory failure with hypoxia: Secondary | ICD-10-CM | POA: Diagnosis not present

## 2021-05-26 DIAGNOSIS — I482 Chronic atrial fibrillation, unspecified: Secondary | ICD-10-CM | POA: Diagnosis not present

## 2021-05-26 DIAGNOSIS — J9621 Acute and chronic respiratory failure with hypoxia: Secondary | ICD-10-CM | POA: Diagnosis not present

## 2021-05-26 DIAGNOSIS — I5032 Chronic diastolic (congestive) heart failure: Secondary | ICD-10-CM | POA: Diagnosis not present

## 2021-05-26 DIAGNOSIS — J441 Chronic obstructive pulmonary disease with (acute) exacerbation: Secondary | ICD-10-CM | POA: Diagnosis not present

## 2021-05-26 DIAGNOSIS — R7881 Bacteremia: Secondary | ICD-10-CM | POA: Diagnosis not present

## 2021-05-27 DIAGNOSIS — Z7901 Long term (current) use of anticoagulants: Secondary | ICD-10-CM | POA: Diagnosis not present

## 2021-05-27 DIAGNOSIS — R7881 Bacteremia: Secondary | ICD-10-CM | POA: Diagnosis not present

## 2021-05-27 DIAGNOSIS — D179 Benign lipomatous neoplasm, unspecified: Secondary | ICD-10-CM | POA: Diagnosis not present

## 2021-05-27 DIAGNOSIS — K769 Liver disease, unspecified: Secondary | ICD-10-CM | POA: Diagnosis not present

## 2021-05-27 DIAGNOSIS — M79604 Pain in right leg: Secondary | ICD-10-CM | POA: Diagnosis not present

## 2021-05-27 DIAGNOSIS — J9601 Acute respiratory failure with hypoxia: Secondary | ICD-10-CM | POA: Diagnosis not present

## 2021-05-27 DIAGNOSIS — M7989 Other specified soft tissue disorders: Secondary | ICD-10-CM | POA: Diagnosis not present

## 2021-05-27 DIAGNOSIS — D72829 Elevated white blood cell count, unspecified: Secondary | ICD-10-CM | POA: Diagnosis not present

## 2021-05-27 DIAGNOSIS — J9621 Acute and chronic respiratory failure with hypoxia: Secondary | ICD-10-CM | POA: Diagnosis not present

## 2021-05-27 DIAGNOSIS — J441 Chronic obstructive pulmonary disease with (acute) exacerbation: Secondary | ICD-10-CM | POA: Diagnosis not present

## 2021-05-27 DIAGNOSIS — R053 Chronic cough: Secondary | ICD-10-CM | POA: Diagnosis not present

## 2021-05-27 DIAGNOSIS — I482 Chronic atrial fibrillation, unspecified: Secondary | ICD-10-CM | POA: Diagnosis not present

## 2021-05-27 DIAGNOSIS — R262 Difficulty in walking, not elsewhere classified: Secondary | ICD-10-CM | POA: Diagnosis not present

## 2021-05-27 DIAGNOSIS — K612 Anorectal abscess: Secondary | ICD-10-CM | POA: Diagnosis not present

## 2021-05-27 DIAGNOSIS — I5032 Chronic diastolic (congestive) heart failure: Secondary | ICD-10-CM | POA: Diagnosis not present

## 2021-05-27 DIAGNOSIS — M79605 Pain in left leg: Secondary | ICD-10-CM | POA: Diagnosis not present

## 2021-05-28 DIAGNOSIS — J9621 Acute and chronic respiratory failure with hypoxia: Secondary | ICD-10-CM | POA: Diagnosis not present

## 2021-05-28 DIAGNOSIS — R053 Chronic cough: Secondary | ICD-10-CM | POA: Diagnosis not present

## 2021-05-28 DIAGNOSIS — K769 Liver disease, unspecified: Secondary | ICD-10-CM | POA: Diagnosis not present

## 2021-05-28 DIAGNOSIS — Z7901 Long term (current) use of anticoagulants: Secondary | ICD-10-CM | POA: Diagnosis not present

## 2021-05-28 DIAGNOSIS — R262 Difficulty in walking, not elsewhere classified: Secondary | ICD-10-CM | POA: Diagnosis not present

## 2021-05-28 DIAGNOSIS — M7989 Other specified soft tissue disorders: Secondary | ICD-10-CM | POA: Diagnosis not present

## 2021-05-28 DIAGNOSIS — M79604 Pain in right leg: Secondary | ICD-10-CM | POA: Diagnosis not present

## 2021-05-28 DIAGNOSIS — R7881 Bacteremia: Secondary | ICD-10-CM | POA: Diagnosis not present

## 2021-05-28 DIAGNOSIS — J441 Chronic obstructive pulmonary disease with (acute) exacerbation: Secondary | ICD-10-CM | POA: Diagnosis not present

## 2021-05-28 DIAGNOSIS — K612 Anorectal abscess: Secondary | ICD-10-CM | POA: Diagnosis not present

## 2021-05-28 DIAGNOSIS — D72829 Elevated white blood cell count, unspecified: Secondary | ICD-10-CM | POA: Diagnosis not present

## 2021-05-28 DIAGNOSIS — I5032 Chronic diastolic (congestive) heart failure: Secondary | ICD-10-CM | POA: Diagnosis not present

## 2021-05-28 DIAGNOSIS — D179 Benign lipomatous neoplasm, unspecified: Secondary | ICD-10-CM | POA: Diagnosis not present

## 2021-05-28 DIAGNOSIS — M79605 Pain in left leg: Secondary | ICD-10-CM | POA: Diagnosis not present

## 2021-05-28 DIAGNOSIS — J9601 Acute respiratory failure with hypoxia: Secondary | ICD-10-CM | POA: Diagnosis not present

## 2021-05-28 DIAGNOSIS — I482 Chronic atrial fibrillation, unspecified: Secondary | ICD-10-CM | POA: Diagnosis not present

## 2021-05-29 DIAGNOSIS — J9621 Acute and chronic respiratory failure with hypoxia: Secondary | ICD-10-CM | POA: Diagnosis not present

## 2021-05-29 DIAGNOSIS — J9601 Acute respiratory failure with hypoxia: Secondary | ICD-10-CM | POA: Diagnosis not present

## 2021-05-29 DIAGNOSIS — I482 Chronic atrial fibrillation, unspecified: Secondary | ICD-10-CM | POA: Diagnosis not present

## 2021-05-29 DIAGNOSIS — K612 Anorectal abscess: Secondary | ICD-10-CM | POA: Diagnosis not present

## 2021-05-29 DIAGNOSIS — I5032 Chronic diastolic (congestive) heart failure: Secondary | ICD-10-CM | POA: Diagnosis not present

## 2021-05-29 DIAGNOSIS — J441 Chronic obstructive pulmonary disease with (acute) exacerbation: Secondary | ICD-10-CM | POA: Diagnosis not present

## 2021-05-29 DIAGNOSIS — Z7901 Long term (current) use of anticoagulants: Secondary | ICD-10-CM | POA: Diagnosis not present

## 2021-05-29 DIAGNOSIS — R262 Difficulty in walking, not elsewhere classified: Secondary | ICD-10-CM | POA: Diagnosis not present

## 2021-05-29 DIAGNOSIS — R7881 Bacteremia: Secondary | ICD-10-CM | POA: Diagnosis not present

## 2021-05-30 DIAGNOSIS — I482 Chronic atrial fibrillation, unspecified: Secondary | ICD-10-CM | POA: Diagnosis not present

## 2021-05-30 DIAGNOSIS — J441 Chronic obstructive pulmonary disease with (acute) exacerbation: Secondary | ICD-10-CM | POA: Diagnosis not present

## 2021-05-30 DIAGNOSIS — J9601 Acute respiratory failure with hypoxia: Secondary | ICD-10-CM | POA: Diagnosis not present

## 2021-05-30 DIAGNOSIS — Z7901 Long term (current) use of anticoagulants: Secondary | ICD-10-CM | POA: Diagnosis not present

## 2021-05-30 DIAGNOSIS — K612 Anorectal abscess: Secondary | ICD-10-CM | POA: Diagnosis not present

## 2021-05-30 DIAGNOSIS — J9621 Acute and chronic respiratory failure with hypoxia: Secondary | ICD-10-CM | POA: Diagnosis not present

## 2021-05-30 DIAGNOSIS — R262 Difficulty in walking, not elsewhere classified: Secondary | ICD-10-CM | POA: Diagnosis not present

## 2021-05-30 DIAGNOSIS — R7881 Bacteremia: Secondary | ICD-10-CM | POA: Diagnosis not present

## 2021-05-30 DIAGNOSIS — I5032 Chronic diastolic (congestive) heart failure: Secondary | ICD-10-CM | POA: Diagnosis not present

## 2021-05-31 DIAGNOSIS — J441 Chronic obstructive pulmonary disease with (acute) exacerbation: Secondary | ICD-10-CM | POA: Diagnosis not present

## 2021-05-31 DIAGNOSIS — I482 Chronic atrial fibrillation, unspecified: Secondary | ICD-10-CM | POA: Diagnosis not present

## 2021-05-31 DIAGNOSIS — J9601 Acute respiratory failure with hypoxia: Secondary | ICD-10-CM | POA: Diagnosis not present

## 2021-05-31 DIAGNOSIS — R262 Difficulty in walking, not elsewhere classified: Secondary | ICD-10-CM | POA: Diagnosis not present

## 2021-05-31 DIAGNOSIS — J9621 Acute and chronic respiratory failure with hypoxia: Secondary | ICD-10-CM | POA: Diagnosis not present

## 2021-05-31 DIAGNOSIS — R7881 Bacteremia: Secondary | ICD-10-CM | POA: Diagnosis not present

## 2021-05-31 DIAGNOSIS — Z7901 Long term (current) use of anticoagulants: Secondary | ICD-10-CM | POA: Diagnosis not present

## 2021-05-31 DIAGNOSIS — K612 Anorectal abscess: Secondary | ICD-10-CM | POA: Diagnosis not present

## 2021-05-31 DIAGNOSIS — I5032 Chronic diastolic (congestive) heart failure: Secondary | ICD-10-CM | POA: Diagnosis not present

## 2021-06-01 DIAGNOSIS — R531 Weakness: Secondary | ICD-10-CM | POA: Diagnosis not present

## 2021-06-01 DIAGNOSIS — L98419 Non-pressure chronic ulcer of buttock with unspecified severity: Secondary | ICD-10-CM | POA: Diagnosis not present

## 2021-06-01 DIAGNOSIS — J9621 Acute and chronic respiratory failure with hypoxia: Secondary | ICD-10-CM | POA: Diagnosis not present

## 2021-06-01 DIAGNOSIS — I482 Chronic atrial fibrillation, unspecified: Secondary | ICD-10-CM | POA: Diagnosis not present

## 2021-06-01 DIAGNOSIS — Z7901 Long term (current) use of anticoagulants: Secondary | ICD-10-CM | POA: Diagnosis not present

## 2021-06-01 DIAGNOSIS — I5032 Chronic diastolic (congestive) heart failure: Secondary | ICD-10-CM | POA: Diagnosis not present

## 2021-06-01 DIAGNOSIS — K612 Anorectal abscess: Secondary | ICD-10-CM | POA: Diagnosis not present

## 2021-06-01 DIAGNOSIS — Z8679 Personal history of other diseases of the circulatory system: Secondary | ICD-10-CM | POA: Diagnosis not present

## 2021-06-01 DIAGNOSIS — J441 Chronic obstructive pulmonary disease with (acute) exacerbation: Secondary | ICD-10-CM | POA: Diagnosis not present

## 2021-06-01 DIAGNOSIS — Z743 Need for continuous supervision: Secondary | ICD-10-CM | POA: Diagnosis not present

## 2021-06-01 DIAGNOSIS — J9601 Acute respiratory failure with hypoxia: Secondary | ICD-10-CM | POA: Diagnosis not present

## 2021-06-01 DIAGNOSIS — R069 Unspecified abnormalities of breathing: Secondary | ICD-10-CM | POA: Diagnosis not present

## 2021-06-02 DIAGNOSIS — R293 Abnormal posture: Secondary | ICD-10-CM | POA: Diagnosis not present

## 2021-06-02 DIAGNOSIS — M6281 Muscle weakness (generalized): Secondary | ICD-10-CM | POA: Diagnosis not present

## 2021-06-02 DIAGNOSIS — I10 Essential (primary) hypertension: Secondary | ICD-10-CM | POA: Diagnosis not present

## 2021-06-02 DIAGNOSIS — M1991 Primary osteoarthritis, unspecified site: Secondary | ICD-10-CM | POA: Diagnosis not present

## 2021-06-02 DIAGNOSIS — R278 Other lack of coordination: Secondary | ICD-10-CM | POA: Diagnosis not present

## 2021-06-02 DIAGNOSIS — J449 Chronic obstructive pulmonary disease, unspecified: Secondary | ICD-10-CM | POA: Diagnosis not present

## 2021-06-02 DIAGNOSIS — I5042 Chronic combined systolic (congestive) and diastolic (congestive) heart failure: Secondary | ICD-10-CM | POA: Diagnosis not present

## 2021-06-02 DIAGNOSIS — Z79899 Other long term (current) drug therapy: Secondary | ICD-10-CM | POA: Diagnosis not present

## 2021-06-02 DIAGNOSIS — R2689 Other abnormalities of gait and mobility: Secondary | ICD-10-CM | POA: Diagnosis not present

## 2021-06-02 DIAGNOSIS — F3341 Major depressive disorder, recurrent, in partial remission: Secondary | ICD-10-CM | POA: Diagnosis not present

## 2021-06-02 DIAGNOSIS — G4730 Sleep apnea, unspecified: Secondary | ICD-10-CM | POA: Diagnosis not present

## 2021-06-02 DIAGNOSIS — I482 Chronic atrial fibrillation, unspecified: Secondary | ICD-10-CM | POA: Diagnosis not present

## 2021-06-03 DIAGNOSIS — F331 Major depressive disorder, recurrent, moderate: Secondary | ICD-10-CM | POA: Diagnosis not present

## 2021-06-04 DIAGNOSIS — J449 Chronic obstructive pulmonary disease, unspecified: Secondary | ICD-10-CM | POA: Diagnosis not present

## 2021-06-04 DIAGNOSIS — R0600 Dyspnea, unspecified: Secondary | ICD-10-CM | POA: Diagnosis not present

## 2021-06-04 DIAGNOSIS — I509 Heart failure, unspecified: Secondary | ICD-10-CM | POA: Diagnosis not present

## 2021-06-04 DIAGNOSIS — I959 Hypotension, unspecified: Secondary | ICD-10-CM | POA: Diagnosis not present

## 2021-06-04 DIAGNOSIS — D75839 Thrombocytosis, unspecified: Secondary | ICD-10-CM | POA: Diagnosis not present

## 2021-06-04 DIAGNOSIS — E876 Hypokalemia: Secondary | ICD-10-CM | POA: Diagnosis not present

## 2021-06-04 DIAGNOSIS — R079 Chest pain, unspecified: Secondary | ICD-10-CM | POA: Diagnosis not present

## 2021-06-04 DIAGNOSIS — J9621 Acute and chronic respiratory failure with hypoxia: Secondary | ICD-10-CM | POA: Diagnosis not present

## 2021-06-04 DIAGNOSIS — R062 Wheezing: Secondary | ICD-10-CM | POA: Diagnosis not present

## 2021-06-04 DIAGNOSIS — I110 Hypertensive heart disease with heart failure: Secondary | ICD-10-CM | POA: Diagnosis not present

## 2021-06-04 DIAGNOSIS — F329 Major depressive disorder, single episode, unspecified: Secondary | ICD-10-CM | POA: Diagnosis not present

## 2021-06-04 DIAGNOSIS — Z9981 Dependence on supplemental oxygen: Secondary | ICD-10-CM | POA: Diagnosis not present

## 2021-06-04 DIAGNOSIS — R0789 Other chest pain: Secondary | ICD-10-CM | POA: Diagnosis not present

## 2021-06-04 DIAGNOSIS — Z66 Do not resuscitate: Secondary | ICD-10-CM | POA: Diagnosis not present

## 2021-06-04 DIAGNOSIS — Z7901 Long term (current) use of anticoagulants: Secondary | ICD-10-CM | POA: Diagnosis not present

## 2021-06-04 DIAGNOSIS — R069 Unspecified abnormalities of breathing: Secondary | ICD-10-CM | POA: Diagnosis not present

## 2021-06-04 DIAGNOSIS — I5033 Acute on chronic diastolic (congestive) heart failure: Secondary | ICD-10-CM | POA: Diagnosis not present

## 2021-06-04 DIAGNOSIS — Z87891 Personal history of nicotine dependence: Secondary | ICD-10-CM | POA: Diagnosis not present

## 2021-06-05 DIAGNOSIS — I509 Heart failure, unspecified: Secondary | ICD-10-CM | POA: Diagnosis not present

## 2021-06-05 DIAGNOSIS — R0600 Dyspnea, unspecified: Secondary | ICD-10-CM | POA: Diagnosis not present

## 2021-06-05 DIAGNOSIS — J9611 Chronic respiratory failure with hypoxia: Secondary | ICD-10-CM | POA: Diagnosis not present

## 2021-06-05 DIAGNOSIS — R079 Chest pain, unspecified: Secondary | ICD-10-CM | POA: Diagnosis not present

## 2021-06-06 DIAGNOSIS — R079 Chest pain, unspecified: Secondary | ICD-10-CM | POA: Diagnosis not present

## 2021-06-06 DIAGNOSIS — R0600 Dyspnea, unspecified: Secondary | ICD-10-CM | POA: Diagnosis not present

## 2021-06-06 DIAGNOSIS — J9611 Chronic respiratory failure with hypoxia: Secondary | ICD-10-CM | POA: Diagnosis not present

## 2021-06-06 DIAGNOSIS — I509 Heart failure, unspecified: Secondary | ICD-10-CM | POA: Diagnosis not present

## 2021-06-07 DIAGNOSIS — I509 Heart failure, unspecified: Secondary | ICD-10-CM | POA: Diagnosis not present

## 2021-06-07 DIAGNOSIS — L98419 Non-pressure chronic ulcer of buttock with unspecified severity: Secondary | ICD-10-CM | POA: Diagnosis not present

## 2021-06-07 DIAGNOSIS — R0602 Shortness of breath: Secondary | ICD-10-CM | POA: Diagnosis not present

## 2021-06-07 DIAGNOSIS — J441 Chronic obstructive pulmonary disease with (acute) exacerbation: Secondary | ICD-10-CM | POA: Diagnosis not present

## 2021-06-07 DIAGNOSIS — Z87891 Personal history of nicotine dependence: Secondary | ICD-10-CM | POA: Diagnosis not present

## 2021-06-07 DIAGNOSIS — J9622 Acute and chronic respiratory failure with hypercapnia: Secondary | ICD-10-CM | POA: Diagnosis not present

## 2021-06-07 DIAGNOSIS — M170 Bilateral primary osteoarthritis of knee: Secondary | ICD-10-CM | POA: Diagnosis not present

## 2021-06-07 DIAGNOSIS — R079 Chest pain, unspecified: Secondary | ICD-10-CM | POA: Diagnosis not present

## 2021-06-07 DIAGNOSIS — E669 Obesity, unspecified: Secondary | ICD-10-CM | POA: Diagnosis not present

## 2021-06-07 DIAGNOSIS — G4733 Obstructive sleep apnea (adult) (pediatric): Secondary | ICD-10-CM | POA: Diagnosis not present

## 2021-06-07 DIAGNOSIS — G4730 Sleep apnea, unspecified: Secondary | ICD-10-CM | POA: Diagnosis not present

## 2021-06-07 DIAGNOSIS — Z9981 Dependence on supplemental oxygen: Secondary | ICD-10-CM | POA: Diagnosis not present

## 2021-06-07 DIAGNOSIS — I959 Hypotension, unspecified: Secondary | ICD-10-CM | POA: Diagnosis not present

## 2021-06-07 DIAGNOSIS — Z6841 Body Mass Index (BMI) 40.0 and over, adult: Secondary | ICD-10-CM | POA: Diagnosis not present

## 2021-06-07 DIAGNOSIS — L989 Disorder of the skin and subcutaneous tissue, unspecified: Secondary | ICD-10-CM | POA: Diagnosis not present

## 2021-06-07 DIAGNOSIS — Z7901 Long term (current) use of anticoagulants: Secondary | ICD-10-CM | POA: Diagnosis not present

## 2021-06-07 DIAGNOSIS — F064 Anxiety disorder due to known physiological condition: Secondary | ICD-10-CM | POA: Diagnosis not present

## 2021-06-07 DIAGNOSIS — G9009 Other idiopathic peripheral autonomic neuropathy: Secondary | ICD-10-CM | POA: Diagnosis not present

## 2021-06-07 DIAGNOSIS — R000 Tachycardia, unspecified: Secondary | ICD-10-CM | POA: Diagnosis not present

## 2021-06-07 DIAGNOSIS — T8149XD Infection following a procedure, other surgical site, subsequent encounter: Secondary | ICD-10-CM | POA: Diagnosis not present

## 2021-06-07 DIAGNOSIS — R58 Hemorrhage, not elsewhere classified: Secondary | ICD-10-CM | POA: Diagnosis not present

## 2021-06-07 DIAGNOSIS — Z66 Do not resuscitate: Secondary | ICD-10-CM | POA: Diagnosis not present

## 2021-06-07 DIAGNOSIS — K612 Anorectal abscess: Secondary | ICD-10-CM | POA: Diagnosis not present

## 2021-06-07 DIAGNOSIS — E876 Hypokalemia: Secondary | ICD-10-CM | POA: Diagnosis not present

## 2021-06-07 DIAGNOSIS — E785 Hyperlipidemia, unspecified: Secondary | ICD-10-CM | POA: Diagnosis not present

## 2021-06-07 DIAGNOSIS — K219 Gastro-esophageal reflux disease without esophagitis: Secondary | ICD-10-CM | POA: Diagnosis not present

## 2021-06-07 DIAGNOSIS — E662 Morbid (severe) obesity with alveolar hypoventilation: Secondary | ICD-10-CM | POA: Diagnosis not present

## 2021-06-07 DIAGNOSIS — M1991 Primary osteoarthritis, unspecified site: Secondary | ICD-10-CM | POA: Diagnosis not present

## 2021-06-07 DIAGNOSIS — J9611 Chronic respiratory failure with hypoxia: Secondary | ICD-10-CM | POA: Diagnosis not present

## 2021-06-07 DIAGNOSIS — M1990 Unspecified osteoarthritis, unspecified site: Secondary | ICD-10-CM | POA: Diagnosis not present

## 2021-06-07 DIAGNOSIS — R5382 Chronic fatigue, unspecified: Secondary | ICD-10-CM | POA: Diagnosis not present

## 2021-06-07 DIAGNOSIS — I4891 Unspecified atrial fibrillation: Secondary | ICD-10-CM | POA: Diagnosis not present

## 2021-06-07 DIAGNOSIS — J449 Chronic obstructive pulmonary disease, unspecified: Secondary | ICD-10-CM | POA: Diagnosis not present

## 2021-06-07 DIAGNOSIS — R0989 Other specified symptoms and signs involving the circulatory and respiratory systems: Secondary | ICD-10-CM | POA: Diagnosis not present

## 2021-06-07 DIAGNOSIS — R0981 Nasal congestion: Secondary | ICD-10-CM | POA: Diagnosis not present

## 2021-06-07 DIAGNOSIS — J9621 Acute and chronic respiratory failure with hypoxia: Secondary | ICD-10-CM | POA: Diagnosis not present

## 2021-06-07 DIAGNOSIS — M19019 Primary osteoarthritis, unspecified shoulder: Secondary | ICD-10-CM | POA: Diagnosis not present

## 2021-06-07 DIAGNOSIS — R0600 Dyspnea, unspecified: Secondary | ICD-10-CM | POA: Diagnosis not present

## 2021-06-07 DIAGNOSIS — F331 Major depressive disorder, recurrent, moderate: Secondary | ICD-10-CM | POA: Diagnosis not present

## 2021-06-07 DIAGNOSIS — I5032 Chronic diastolic (congestive) heart failure: Secondary | ICD-10-CM | POA: Diagnosis not present

## 2021-06-07 DIAGNOSIS — I482 Chronic atrial fibrillation, unspecified: Secondary | ICD-10-CM | POA: Diagnosis not present

## 2021-06-07 DIAGNOSIS — J3489 Other specified disorders of nose and nasal sinuses: Secondary | ICD-10-CM | POA: Diagnosis not present

## 2021-06-07 DIAGNOSIS — S31819D Unspecified open wound of right buttock, subsequent encounter: Secondary | ICD-10-CM | POA: Diagnosis not present

## 2021-06-07 DIAGNOSIS — M6281 Muscle weakness (generalized): Secondary | ICD-10-CM | POA: Diagnosis not present

## 2021-06-07 DIAGNOSIS — F329 Major depressive disorder, single episode, unspecified: Secondary | ICD-10-CM | POA: Diagnosis not present

## 2021-06-07 DIAGNOSIS — I5033 Acute on chronic diastolic (congestive) heart failure: Secondary | ICD-10-CM | POA: Diagnosis not present

## 2021-06-07 DIAGNOSIS — Z79899 Other long term (current) drug therapy: Secondary | ICD-10-CM | POA: Diagnosis not present

## 2021-06-07 DIAGNOSIS — R069 Unspecified abnormalities of breathing: Secondary | ICD-10-CM | POA: Diagnosis not present

## 2021-06-07 DIAGNOSIS — Z743 Need for continuous supervision: Secondary | ICD-10-CM | POA: Diagnosis not present

## 2021-06-07 DIAGNOSIS — F3341 Major depressive disorder, recurrent, in partial remission: Secondary | ICD-10-CM | POA: Diagnosis not present

## 2021-06-07 DIAGNOSIS — F411 Generalized anxiety disorder: Secondary | ICD-10-CM | POA: Diagnosis not present

## 2021-06-07 DIAGNOSIS — I110 Hypertensive heart disease with heart failure: Secondary | ICD-10-CM | POA: Diagnosis not present

## 2021-06-08 DIAGNOSIS — L98419 Non-pressure chronic ulcer of buttock with unspecified severity: Secondary | ICD-10-CM | POA: Diagnosis not present

## 2021-06-08 DIAGNOSIS — I5033 Acute on chronic diastolic (congestive) heart failure: Secondary | ICD-10-CM | POA: Diagnosis not present

## 2021-06-08 DIAGNOSIS — Z79899 Other long term (current) drug therapy: Secondary | ICD-10-CM | POA: Diagnosis not present

## 2021-06-08 DIAGNOSIS — F3341 Major depressive disorder, recurrent, in partial remission: Secondary | ICD-10-CM | POA: Diagnosis not present

## 2021-06-08 DIAGNOSIS — I482 Chronic atrial fibrillation, unspecified: Secondary | ICD-10-CM | POA: Diagnosis not present

## 2021-06-08 DIAGNOSIS — M1991 Primary osteoarthritis, unspecified site: Secondary | ICD-10-CM | POA: Diagnosis not present

## 2021-06-08 DIAGNOSIS — M6281 Muscle weakness (generalized): Secondary | ICD-10-CM | POA: Diagnosis not present

## 2021-06-08 DIAGNOSIS — G4730 Sleep apnea, unspecified: Secondary | ICD-10-CM | POA: Diagnosis not present

## 2021-06-08 DIAGNOSIS — J9611 Chronic respiratory failure with hypoxia: Secondary | ICD-10-CM | POA: Diagnosis not present

## 2021-06-08 DIAGNOSIS — J449 Chronic obstructive pulmonary disease, unspecified: Secondary | ICD-10-CM | POA: Diagnosis not present

## 2021-06-08 DIAGNOSIS — I110 Hypertensive heart disease with heart failure: Secondary | ICD-10-CM | POA: Diagnosis not present

## 2021-06-09 DIAGNOSIS — Z7901 Long term (current) use of anticoagulants: Secondary | ICD-10-CM | POA: Diagnosis not present

## 2021-06-09 DIAGNOSIS — F411 Generalized anxiety disorder: Secondary | ICD-10-CM | POA: Diagnosis not present

## 2021-06-09 DIAGNOSIS — I509 Heart failure, unspecified: Secondary | ICD-10-CM | POA: Diagnosis not present

## 2021-06-09 DIAGNOSIS — S31819D Unspecified open wound of right buttock, subsequent encounter: Secondary | ICD-10-CM | POA: Diagnosis not present

## 2021-06-09 DIAGNOSIS — G9009 Other idiopathic peripheral autonomic neuropathy: Secondary | ICD-10-CM | POA: Diagnosis not present

## 2021-06-09 DIAGNOSIS — J441 Chronic obstructive pulmonary disease with (acute) exacerbation: Secondary | ICD-10-CM | POA: Diagnosis not present

## 2021-06-11 DIAGNOSIS — K219 Gastro-esophageal reflux disease without esophagitis: Secondary | ICD-10-CM | POA: Diagnosis not present

## 2021-06-11 DIAGNOSIS — S31819D Unspecified open wound of right buttock, subsequent encounter: Secondary | ICD-10-CM | POA: Diagnosis not present

## 2021-06-11 DIAGNOSIS — I509 Heart failure, unspecified: Secondary | ICD-10-CM | POA: Diagnosis not present

## 2021-06-11 DIAGNOSIS — J441 Chronic obstructive pulmonary disease with (acute) exacerbation: Secondary | ICD-10-CM | POA: Diagnosis not present

## 2021-06-11 DIAGNOSIS — G4730 Sleep apnea, unspecified: Secondary | ICD-10-CM | POA: Diagnosis not present

## 2021-06-11 DIAGNOSIS — M1990 Unspecified osteoarthritis, unspecified site: Secondary | ICD-10-CM | POA: Diagnosis not present

## 2021-06-11 DIAGNOSIS — M6281 Muscle weakness (generalized): Secondary | ICD-10-CM | POA: Diagnosis not present

## 2021-06-11 DIAGNOSIS — I482 Chronic atrial fibrillation, unspecified: Secondary | ICD-10-CM | POA: Diagnosis not present

## 2021-06-14 DIAGNOSIS — I482 Chronic atrial fibrillation, unspecified: Secondary | ICD-10-CM | POA: Diagnosis not present

## 2021-06-14 DIAGNOSIS — I509 Heart failure, unspecified: Secondary | ICD-10-CM | POA: Diagnosis not present

## 2021-06-14 DIAGNOSIS — S31819D Unspecified open wound of right buttock, subsequent encounter: Secondary | ICD-10-CM | POA: Diagnosis not present

## 2021-06-14 DIAGNOSIS — M6281 Muscle weakness (generalized): Secondary | ICD-10-CM | POA: Diagnosis not present

## 2021-06-14 DIAGNOSIS — J441 Chronic obstructive pulmonary disease with (acute) exacerbation: Secondary | ICD-10-CM | POA: Diagnosis not present

## 2021-06-14 DIAGNOSIS — F411 Generalized anxiety disorder: Secondary | ICD-10-CM | POA: Diagnosis not present

## 2021-06-14 DIAGNOSIS — M1990 Unspecified osteoarthritis, unspecified site: Secondary | ICD-10-CM | POA: Diagnosis not present

## 2021-06-14 DIAGNOSIS — F064 Anxiety disorder due to known physiological condition: Secondary | ICD-10-CM | POA: Diagnosis not present

## 2021-06-15 DIAGNOSIS — L98419 Non-pressure chronic ulcer of buttock with unspecified severity: Secondary | ICD-10-CM | POA: Diagnosis not present

## 2021-06-16 DIAGNOSIS — K219 Gastro-esophageal reflux disease without esophagitis: Secondary | ICD-10-CM | POA: Diagnosis not present

## 2021-06-16 DIAGNOSIS — M1990 Unspecified osteoarthritis, unspecified site: Secondary | ICD-10-CM | POA: Diagnosis not present

## 2021-06-16 DIAGNOSIS — I482 Chronic atrial fibrillation, unspecified: Secondary | ICD-10-CM | POA: Diagnosis not present

## 2021-06-16 DIAGNOSIS — M6281 Muscle weakness (generalized): Secondary | ICD-10-CM | POA: Diagnosis not present

## 2021-06-16 DIAGNOSIS — G4733 Obstructive sleep apnea (adult) (pediatric): Secondary | ICD-10-CM | POA: Diagnosis not present

## 2021-06-16 DIAGNOSIS — I509 Heart failure, unspecified: Secondary | ICD-10-CM | POA: Diagnosis not present

## 2021-06-16 DIAGNOSIS — S31819D Unspecified open wound of right buttock, subsequent encounter: Secondary | ICD-10-CM | POA: Diagnosis not present

## 2021-06-16 DIAGNOSIS — J441 Chronic obstructive pulmonary disease with (acute) exacerbation: Secondary | ICD-10-CM | POA: Diagnosis not present

## 2021-06-17 DIAGNOSIS — F411 Generalized anxiety disorder: Secondary | ICD-10-CM | POA: Diagnosis not present

## 2021-06-17 DIAGNOSIS — F331 Major depressive disorder, recurrent, moderate: Secondary | ICD-10-CM | POA: Diagnosis not present

## 2021-06-18 DIAGNOSIS — S31819D Unspecified open wound of right buttock, subsequent encounter: Secondary | ICD-10-CM | POA: Diagnosis not present

## 2021-06-18 DIAGNOSIS — M19019 Primary osteoarthritis, unspecified shoulder: Secondary | ICD-10-CM | POA: Diagnosis not present

## 2021-06-18 DIAGNOSIS — M170 Bilateral primary osteoarthritis of knee: Secondary | ICD-10-CM | POA: Diagnosis not present

## 2021-06-19 DIAGNOSIS — R069 Unspecified abnormalities of breathing: Secondary | ICD-10-CM | POA: Diagnosis not present

## 2021-06-19 DIAGNOSIS — E662 Morbid (severe) obesity with alveolar hypoventilation: Secondary | ICD-10-CM | POA: Diagnosis not present

## 2021-06-19 DIAGNOSIS — Z743 Need for continuous supervision: Secondary | ICD-10-CM | POA: Diagnosis not present

## 2021-06-19 DIAGNOSIS — Z87891 Personal history of nicotine dependence: Secondary | ICD-10-CM | POA: Diagnosis not present

## 2021-06-19 DIAGNOSIS — R5382 Chronic fatigue, unspecified: Secondary | ICD-10-CM | POA: Diagnosis not present

## 2021-06-19 DIAGNOSIS — J3489 Other specified disorders of nose and nasal sinuses: Secondary | ICD-10-CM | POA: Diagnosis not present

## 2021-06-19 DIAGNOSIS — F329 Major depressive disorder, single episode, unspecified: Secondary | ICD-10-CM | POA: Diagnosis not present

## 2021-06-19 DIAGNOSIS — R58 Hemorrhage, not elsewhere classified: Secondary | ICD-10-CM | POA: Diagnosis not present

## 2021-06-19 DIAGNOSIS — R0989 Other specified symptoms and signs involving the circulatory and respiratory systems: Secondary | ICD-10-CM | POA: Diagnosis not present

## 2021-06-19 DIAGNOSIS — R600 Localized edema: Secondary | ICD-10-CM | POA: Diagnosis not present

## 2021-06-19 DIAGNOSIS — I482 Chronic atrial fibrillation, unspecified: Secondary | ICD-10-CM | POA: Diagnosis not present

## 2021-06-19 DIAGNOSIS — E669 Obesity, unspecified: Secondary | ICD-10-CM | POA: Diagnosis not present

## 2021-06-19 DIAGNOSIS — M25561 Pain in right knee: Secondary | ICD-10-CM | POA: Diagnosis not present

## 2021-06-19 DIAGNOSIS — J9622 Acute and chronic respiratory failure with hypercapnia: Secondary | ICD-10-CM | POA: Diagnosis not present

## 2021-06-19 DIAGNOSIS — J9621 Acute and chronic respiratory failure with hypoxia: Secondary | ICD-10-CM | POA: Diagnosis not present

## 2021-06-19 DIAGNOSIS — L989 Disorder of the skin and subcutaneous tissue, unspecified: Secondary | ICD-10-CM | POA: Diagnosis not present

## 2021-06-19 DIAGNOSIS — M7989 Other specified soft tissue disorders: Secondary | ICD-10-CM | POA: Diagnosis not present

## 2021-06-19 DIAGNOSIS — K612 Anorectal abscess: Secondary | ICD-10-CM | POA: Diagnosis not present

## 2021-06-19 DIAGNOSIS — Z7901 Long term (current) use of anticoagulants: Secondary | ICD-10-CM | POA: Diagnosis not present

## 2021-06-19 DIAGNOSIS — R000 Tachycardia, unspecified: Secondary | ICD-10-CM | POA: Diagnosis not present

## 2021-06-19 DIAGNOSIS — J449 Chronic obstructive pulmonary disease, unspecified: Secondary | ICD-10-CM | POA: Diagnosis not present

## 2021-06-19 DIAGNOSIS — I5032 Chronic diastolic (congestive) heart failure: Secondary | ICD-10-CM | POA: Diagnosis not present

## 2021-06-19 DIAGNOSIS — G894 Chronic pain syndrome: Secondary | ICD-10-CM | POA: Diagnosis not present

## 2021-06-19 DIAGNOSIS — R0981 Nasal congestion: Secondary | ICD-10-CM | POA: Diagnosis not present

## 2021-06-19 DIAGNOSIS — R0602 Shortness of breath: Secondary | ICD-10-CM | POA: Diagnosis not present

## 2021-06-19 DIAGNOSIS — I110 Hypertensive heart disease with heart failure: Secondary | ICD-10-CM | POA: Diagnosis not present

## 2021-06-19 DIAGNOSIS — Z6841 Body Mass Index (BMI) 40.0 and over, adult: Secondary | ICD-10-CM | POA: Diagnosis not present

## 2021-06-19 DIAGNOSIS — Z66 Do not resuscitate: Secondary | ICD-10-CM | POA: Diagnosis not present

## 2021-06-20 DIAGNOSIS — I482 Chronic atrial fibrillation, unspecified: Secondary | ICD-10-CM | POA: Diagnosis not present

## 2021-06-20 DIAGNOSIS — G894 Chronic pain syndrome: Secondary | ICD-10-CM | POA: Diagnosis not present

## 2021-06-20 DIAGNOSIS — I5032 Chronic diastolic (congestive) heart failure: Secondary | ICD-10-CM | POA: Diagnosis not present

## 2021-06-20 DIAGNOSIS — R0602 Shortness of breath: Secondary | ICD-10-CM | POA: Diagnosis not present

## 2021-06-20 DIAGNOSIS — M7989 Other specified soft tissue disorders: Secondary | ICD-10-CM | POA: Diagnosis not present

## 2021-06-20 DIAGNOSIS — J9621 Acute and chronic respiratory failure with hypoxia: Secondary | ICD-10-CM | POA: Diagnosis not present

## 2021-06-20 DIAGNOSIS — Z7901 Long term (current) use of anticoagulants: Secondary | ICD-10-CM | POA: Diagnosis not present

## 2021-06-20 DIAGNOSIS — M25561 Pain in right knee: Secondary | ICD-10-CM | POA: Diagnosis not present

## 2021-06-20 DIAGNOSIS — K612 Anorectal abscess: Secondary | ICD-10-CM | POA: Diagnosis not present

## 2021-06-20 DIAGNOSIS — J449 Chronic obstructive pulmonary disease, unspecified: Secondary | ICD-10-CM | POA: Diagnosis not present

## 2021-06-20 DIAGNOSIS — R600 Localized edema: Secondary | ICD-10-CM | POA: Diagnosis not present

## 2021-06-21 DIAGNOSIS — Z7901 Long term (current) use of anticoagulants: Secondary | ICD-10-CM | POA: Diagnosis not present

## 2021-06-21 DIAGNOSIS — J9621 Acute and chronic respiratory failure with hypoxia: Secondary | ICD-10-CM | POA: Diagnosis not present

## 2021-06-21 DIAGNOSIS — R069 Unspecified abnormalities of breathing: Secondary | ICD-10-CM | POA: Diagnosis not present

## 2021-06-21 DIAGNOSIS — K612 Anorectal abscess: Secondary | ICD-10-CM | POA: Diagnosis not present

## 2021-06-21 DIAGNOSIS — I482 Chronic atrial fibrillation, unspecified: Secondary | ICD-10-CM | POA: Diagnosis not present

## 2021-06-21 DIAGNOSIS — J449 Chronic obstructive pulmonary disease, unspecified: Secondary | ICD-10-CM | POA: Diagnosis not present

## 2021-06-21 DIAGNOSIS — I5032 Chronic diastolic (congestive) heart failure: Secondary | ICD-10-CM | POA: Diagnosis not present

## 2021-06-21 DIAGNOSIS — M25561 Pain in right knee: Secondary | ICD-10-CM | POA: Diagnosis not present

## 2021-06-21 DIAGNOSIS — Z743 Need for continuous supervision: Secondary | ICD-10-CM | POA: Diagnosis not present

## 2021-06-21 DIAGNOSIS — R0602 Shortness of breath: Secondary | ICD-10-CM | POA: Diagnosis not present

## 2021-06-21 DIAGNOSIS — G894 Chronic pain syndrome: Secondary | ICD-10-CM | POA: Diagnosis not present

## 2021-06-22 DIAGNOSIS — L98419 Non-pressure chronic ulcer of buttock with unspecified severity: Secondary | ICD-10-CM | POA: Diagnosis not present

## 2021-06-23 DIAGNOSIS — Z79899 Other long term (current) drug therapy: Secondary | ICD-10-CM | POA: Diagnosis not present

## 2021-06-29 DIAGNOSIS — L98419 Non-pressure chronic ulcer of buttock with unspecified severity: Secondary | ICD-10-CM | POA: Diagnosis not present

## 2021-06-30 DIAGNOSIS — I482 Chronic atrial fibrillation, unspecified: Secondary | ICD-10-CM | POA: Diagnosis not present

## 2021-06-30 DIAGNOSIS — J449 Chronic obstructive pulmonary disease, unspecified: Secondary | ICD-10-CM | POA: Diagnosis not present

## 2021-06-30 DIAGNOSIS — G4733 Obstructive sleep apnea (adult) (pediatric): Secondary | ICD-10-CM | POA: Diagnosis not present

## 2021-06-30 DIAGNOSIS — K219 Gastro-esophageal reflux disease without esophagitis: Secondary | ICD-10-CM | POA: Diagnosis not present

## 2021-06-30 DIAGNOSIS — S31819D Unspecified open wound of right buttock, subsequent encounter: Secondary | ICD-10-CM | POA: Diagnosis not present

## 2021-06-30 DIAGNOSIS — I110 Hypertensive heart disease with heart failure: Secondary | ICD-10-CM | POA: Diagnosis not present

## 2021-06-30 DIAGNOSIS — E662 Morbid (severe) obesity with alveolar hypoventilation: Secondary | ICD-10-CM | POA: Diagnosis not present

## 2021-06-30 DIAGNOSIS — M1993 Secondary osteoarthritis, unspecified site: Secondary | ICD-10-CM | POA: Diagnosis not present

## 2021-06-30 DIAGNOSIS — M6281 Muscle weakness (generalized): Secondary | ICD-10-CM | POA: Diagnosis not present

## 2021-07-01 DIAGNOSIS — I482 Chronic atrial fibrillation, unspecified: Secondary | ICD-10-CM | POA: Diagnosis not present

## 2021-07-01 DIAGNOSIS — S31819D Unspecified open wound of right buttock, subsequent encounter: Secondary | ICD-10-CM | POA: Diagnosis not present

## 2021-07-01 DIAGNOSIS — I5042 Chronic combined systolic (congestive) and diastolic (congestive) heart failure: Secondary | ICD-10-CM | POA: Diagnosis not present

## 2021-07-01 DIAGNOSIS — I110 Hypertensive heart disease with heart failure: Secondary | ICD-10-CM | POA: Diagnosis not present

## 2021-07-01 DIAGNOSIS — J449 Chronic obstructive pulmonary disease, unspecified: Secondary | ICD-10-CM | POA: Diagnosis not present

## 2021-07-01 DIAGNOSIS — R54 Age-related physical debility: Secondary | ICD-10-CM | POA: Diagnosis not present

## 2021-07-01 DIAGNOSIS — F331 Major depressive disorder, recurrent, moderate: Secondary | ICD-10-CM | POA: Diagnosis not present

## 2021-07-01 DIAGNOSIS — E039 Hypothyroidism, unspecified: Secondary | ICD-10-CM | POA: Diagnosis not present

## 2021-07-01 DIAGNOSIS — M6281 Muscle weakness (generalized): Secondary | ICD-10-CM | POA: Diagnosis not present

## 2021-07-02 DIAGNOSIS — I509 Heart failure, unspecified: Secondary | ICD-10-CM | POA: Diagnosis not present

## 2021-07-02 DIAGNOSIS — L0291 Cutaneous abscess, unspecified: Secondary | ICD-10-CM | POA: Diagnosis not present

## 2021-07-02 DIAGNOSIS — R234 Changes in skin texture: Secondary | ICD-10-CM | POA: Diagnosis not present

## 2021-07-02 DIAGNOSIS — I959 Hypotension, unspecified: Secondary | ICD-10-CM | POA: Diagnosis not present

## 2021-07-02 DIAGNOSIS — S31819A Unspecified open wound of right buttock, initial encounter: Secondary | ICD-10-CM | POA: Diagnosis not present

## 2021-07-02 DIAGNOSIS — J986 Disorders of diaphragm: Secondary | ICD-10-CM | POA: Diagnosis not present

## 2021-07-02 DIAGNOSIS — Z87891 Personal history of nicotine dependence: Secondary | ICD-10-CM | POA: Diagnosis not present

## 2021-07-02 DIAGNOSIS — I480 Paroxysmal atrial fibrillation: Secondary | ICD-10-CM | POA: Diagnosis not present

## 2021-07-02 DIAGNOSIS — R0789 Other chest pain: Secondary | ICD-10-CM | POA: Diagnosis not present

## 2021-07-02 DIAGNOSIS — J9612 Chronic respiratory failure with hypercapnia: Secondary | ICD-10-CM | POA: Diagnosis not present

## 2021-07-02 DIAGNOSIS — I10 Essential (primary) hypertension: Secondary | ICD-10-CM | POA: Diagnosis not present

## 2021-07-02 DIAGNOSIS — J9611 Chronic respiratory failure with hypoxia: Secondary | ICD-10-CM | POA: Diagnosis not present

## 2021-07-02 DIAGNOSIS — I5032 Chronic diastolic (congestive) heart failure: Secondary | ICD-10-CM | POA: Diagnosis not present

## 2021-07-02 DIAGNOSIS — K219 Gastro-esophageal reflux disease without esophagitis: Secondary | ICD-10-CM | POA: Diagnosis not present

## 2021-07-02 DIAGNOSIS — R0602 Shortness of breath: Secondary | ICD-10-CM | POA: Diagnosis not present

## 2021-07-02 DIAGNOSIS — L0231 Cutaneous abscess of buttock: Secondary | ICD-10-CM | POA: Diagnosis not present

## 2021-07-02 DIAGNOSIS — Z20822 Contact with and (suspected) exposure to covid-19: Secondary | ICD-10-CM | POA: Diagnosis not present

## 2021-07-02 DIAGNOSIS — J80 Acute respiratory distress syndrome: Secondary | ICD-10-CM | POA: Diagnosis not present

## 2021-07-02 DIAGNOSIS — J449 Chronic obstructive pulmonary disease, unspecified: Secondary | ICD-10-CM | POA: Diagnosis not present

## 2021-07-03 DIAGNOSIS — J9621 Acute and chronic respiratory failure with hypoxia: Secondary | ICD-10-CM | POA: Diagnosis not present

## 2021-07-03 DIAGNOSIS — Z6841 Body Mass Index (BMI) 40.0 and over, adult: Secondary | ICD-10-CM | POA: Diagnosis not present

## 2021-07-03 DIAGNOSIS — R262 Difficulty in walking, not elsewhere classified: Secondary | ICD-10-CM | POA: Diagnosis not present

## 2021-07-03 DIAGNOSIS — M79675 Pain in left toe(s): Secondary | ICD-10-CM | POA: Diagnosis not present

## 2021-07-03 DIAGNOSIS — G4733 Obstructive sleep apnea (adult) (pediatric): Secondary | ICD-10-CM | POA: Diagnosis not present

## 2021-07-03 DIAGNOSIS — I5032 Chronic diastolic (congestive) heart failure: Secondary | ICD-10-CM | POA: Diagnosis not present

## 2021-07-03 DIAGNOSIS — R531 Weakness: Secondary | ICD-10-CM | POA: Diagnosis not present

## 2021-07-03 DIAGNOSIS — E785 Hyperlipidemia, unspecified: Secondary | ICD-10-CM | POA: Diagnosis not present

## 2021-07-03 DIAGNOSIS — B351 Tinea unguium: Secondary | ICD-10-CM | POA: Diagnosis not present

## 2021-07-03 DIAGNOSIS — I110 Hypertensive heart disease with heart failure: Secondary | ICD-10-CM | POA: Diagnosis not present

## 2021-07-03 DIAGNOSIS — T8149XD Infection following a procedure, other surgical site, subsequent encounter: Secondary | ICD-10-CM | POA: Diagnosis not present

## 2021-07-03 DIAGNOSIS — Z66 Do not resuscitate: Secondary | ICD-10-CM | POA: Diagnosis not present

## 2021-07-03 DIAGNOSIS — K612 Anorectal abscess: Secondary | ICD-10-CM | POA: Diagnosis not present

## 2021-07-03 DIAGNOSIS — L98419 Non-pressure chronic ulcer of buttock with unspecified severity: Secondary | ICD-10-CM | POA: Diagnosis not present

## 2021-07-03 DIAGNOSIS — R0603 Acute respiratory distress: Secondary | ICD-10-CM | POA: Diagnosis not present

## 2021-07-03 DIAGNOSIS — E876 Hypokalemia: Secondary | ICD-10-CM | POA: Diagnosis not present

## 2021-07-03 DIAGNOSIS — I482 Chronic atrial fibrillation, unspecified: Secondary | ICD-10-CM | POA: Diagnosis not present

## 2021-07-03 DIAGNOSIS — Z86711 Personal history of pulmonary embolism: Secondary | ICD-10-CM | POA: Diagnosis not present

## 2021-07-03 DIAGNOSIS — Z9981 Dependence on supplemental oxygen: Secondary | ICD-10-CM | POA: Diagnosis not present

## 2021-07-03 DIAGNOSIS — J449 Chronic obstructive pulmonary disease, unspecified: Secondary | ICD-10-CM | POA: Diagnosis not present

## 2021-07-03 DIAGNOSIS — E662 Morbid (severe) obesity with alveolar hypoventilation: Secondary | ICD-10-CM | POA: Diagnosis not present

## 2021-07-03 DIAGNOSIS — I509 Heart failure, unspecified: Secondary | ICD-10-CM | POA: Diagnosis not present

## 2021-07-03 DIAGNOSIS — K610 Anal abscess: Secondary | ICD-10-CM | POA: Diagnosis not present

## 2021-07-03 DIAGNOSIS — I739 Peripheral vascular disease, unspecified: Secondary | ICD-10-CM | POA: Diagnosis not present

## 2021-07-04 DIAGNOSIS — E876 Hypokalemia: Secondary | ICD-10-CM | POA: Diagnosis not present

## 2021-07-04 DIAGNOSIS — K612 Anorectal abscess: Secondary | ICD-10-CM | POA: Diagnosis not present

## 2021-07-04 DIAGNOSIS — I509 Heart failure, unspecified: Secondary | ICD-10-CM | POA: Diagnosis not present

## 2021-07-05 DIAGNOSIS — I509 Heart failure, unspecified: Secondary | ICD-10-CM | POA: Diagnosis not present

## 2021-07-05 DIAGNOSIS — K612 Anorectal abscess: Secondary | ICD-10-CM | POA: Diagnosis not present

## 2021-07-05 DIAGNOSIS — E876 Hypokalemia: Secondary | ICD-10-CM | POA: Diagnosis not present

## 2021-07-06 DIAGNOSIS — E876 Hypokalemia: Secondary | ICD-10-CM | POA: Diagnosis not present

## 2021-07-06 DIAGNOSIS — K612 Anorectal abscess: Secondary | ICD-10-CM | POA: Diagnosis not present

## 2021-07-06 DIAGNOSIS — I509 Heart failure, unspecified: Secondary | ICD-10-CM | POA: Diagnosis not present

## 2021-07-07 DIAGNOSIS — E876 Hypokalemia: Secondary | ICD-10-CM | POA: Diagnosis not present

## 2021-07-07 DIAGNOSIS — I509 Heart failure, unspecified: Secondary | ICD-10-CM | POA: Diagnosis not present

## 2021-07-07 DIAGNOSIS — K612 Anorectal abscess: Secondary | ICD-10-CM | POA: Diagnosis not present

## 2021-07-08 DIAGNOSIS — G4733 Obstructive sleep apnea (adult) (pediatric): Secondary | ICD-10-CM | POA: Diagnosis not present

## 2021-07-08 DIAGNOSIS — R531 Weakness: Secondary | ICD-10-CM | POA: Diagnosis not present

## 2021-07-08 DIAGNOSIS — I110 Hypertensive heart disease with heart failure: Secondary | ICD-10-CM | POA: Diagnosis not present

## 2021-07-08 DIAGNOSIS — R0603 Acute respiratory distress: Secondary | ICD-10-CM | POA: Diagnosis not present

## 2021-07-08 DIAGNOSIS — L98419 Non-pressure chronic ulcer of buttock with unspecified severity: Secondary | ICD-10-CM | POA: Diagnosis not present

## 2021-07-08 DIAGNOSIS — I482 Chronic atrial fibrillation, unspecified: Secondary | ICD-10-CM | POA: Diagnosis not present

## 2021-07-08 DIAGNOSIS — T8149XD Infection following a procedure, other surgical site, subsequent encounter: Secondary | ICD-10-CM | POA: Diagnosis not present

## 2021-07-08 DIAGNOSIS — M1993 Secondary osteoarthritis, unspecified site: Secondary | ICD-10-CM | POA: Diagnosis not present

## 2021-07-08 DIAGNOSIS — F064 Anxiety disorder due to known physiological condition: Secondary | ICD-10-CM | POA: Diagnosis not present

## 2021-07-08 DIAGNOSIS — M170 Bilateral primary osteoarthritis of knee: Secondary | ICD-10-CM | POA: Diagnosis not present

## 2021-07-08 DIAGNOSIS — E785 Hyperlipidemia, unspecified: Secondary | ICD-10-CM | POA: Diagnosis not present

## 2021-07-08 DIAGNOSIS — R262 Difficulty in walking, not elsewhere classified: Secondary | ICD-10-CM | POA: Diagnosis not present

## 2021-07-08 DIAGNOSIS — Z9981 Dependence on supplemental oxygen: Secondary | ICD-10-CM | POA: Diagnosis not present

## 2021-07-08 DIAGNOSIS — I739 Peripheral vascular disease, unspecified: Secondary | ICD-10-CM | POA: Diagnosis not present

## 2021-07-08 DIAGNOSIS — M79675 Pain in left toe(s): Secondary | ICD-10-CM | POA: Diagnosis not present

## 2021-07-08 DIAGNOSIS — J9621 Acute and chronic respiratory failure with hypoxia: Secondary | ICD-10-CM | POA: Diagnosis not present

## 2021-07-08 DIAGNOSIS — J449 Chronic obstructive pulmonary disease, unspecified: Secondary | ICD-10-CM | POA: Diagnosis not present

## 2021-07-08 DIAGNOSIS — J9611 Chronic respiratory failure with hypoxia: Secondary | ICD-10-CM | POA: Diagnosis not present

## 2021-07-08 DIAGNOSIS — Z86711 Personal history of pulmonary embolism: Secondary | ICD-10-CM | POA: Diagnosis not present

## 2021-07-08 DIAGNOSIS — Z7901 Long term (current) use of anticoagulants: Secondary | ICD-10-CM | POA: Diagnosis not present

## 2021-07-08 DIAGNOSIS — S31819D Unspecified open wound of right buttock, subsequent encounter: Secondary | ICD-10-CM | POA: Diagnosis not present

## 2021-07-08 DIAGNOSIS — R404 Transient alteration of awareness: Secondary | ICD-10-CM | POA: Diagnosis not present

## 2021-07-08 DIAGNOSIS — I469 Cardiac arrest, cause unspecified: Secondary | ICD-10-CM | POA: Diagnosis not present

## 2021-07-08 DIAGNOSIS — B351 Tinea unguium: Secondary | ICD-10-CM | POA: Diagnosis not present

## 2021-07-08 DIAGNOSIS — I509 Heart failure, unspecified: Secondary | ICD-10-CM | POA: Diagnosis not present

## 2021-07-08 DIAGNOSIS — K612 Anorectal abscess: Secondary | ICD-10-CM | POA: Diagnosis not present

## 2021-07-08 DIAGNOSIS — M6281 Muscle weakness (generalized): Secondary | ICD-10-CM | POA: Diagnosis not present

## 2021-07-08 DIAGNOSIS — E662 Morbid (severe) obesity with alveolar hypoventilation: Secondary | ICD-10-CM | POA: Diagnosis not present

## 2021-07-09 DIAGNOSIS — M1993 Secondary osteoarthritis, unspecified site: Secondary | ICD-10-CM | POA: Diagnosis not present

## 2021-07-09 DIAGNOSIS — I482 Chronic atrial fibrillation, unspecified: Secondary | ICD-10-CM | POA: Diagnosis not present

## 2021-07-09 DIAGNOSIS — G4733 Obstructive sleep apnea (adult) (pediatric): Secondary | ICD-10-CM | POA: Diagnosis not present

## 2021-07-09 DIAGNOSIS — F064 Anxiety disorder due to known physiological condition: Secondary | ICD-10-CM | POA: Diagnosis not present

## 2021-07-09 DIAGNOSIS — M6281 Muscle weakness (generalized): Secondary | ICD-10-CM | POA: Diagnosis not present

## 2021-07-09 DIAGNOSIS — J9611 Chronic respiratory failure with hypoxia: Secondary | ICD-10-CM | POA: Diagnosis not present

## 2021-07-09 DIAGNOSIS — J449 Chronic obstructive pulmonary disease, unspecified: Secondary | ICD-10-CM | POA: Diagnosis not present

## 2021-07-09 DIAGNOSIS — E662 Morbid (severe) obesity with alveolar hypoventilation: Secondary | ICD-10-CM | POA: Diagnosis not present

## 2021-07-09 DIAGNOSIS — S31819D Unspecified open wound of right buttock, subsequent encounter: Secondary | ICD-10-CM | POA: Diagnosis not present

## 2021-07-09 DIAGNOSIS — I110 Hypertensive heart disease with heart failure: Secondary | ICD-10-CM | POA: Diagnosis not present

## 2021-07-12 DIAGNOSIS — S31819D Unspecified open wound of right buttock, subsequent encounter: Secondary | ICD-10-CM | POA: Diagnosis not present

## 2021-07-12 DIAGNOSIS — G4733 Obstructive sleep apnea (adult) (pediatric): Secondary | ICD-10-CM | POA: Diagnosis not present

## 2021-07-12 DIAGNOSIS — M6281 Muscle weakness (generalized): Secondary | ICD-10-CM | POA: Diagnosis not present

## 2021-07-12 DIAGNOSIS — J449 Chronic obstructive pulmonary disease, unspecified: Secondary | ICD-10-CM | POA: Diagnosis not present

## 2021-07-12 DIAGNOSIS — I482 Chronic atrial fibrillation, unspecified: Secondary | ICD-10-CM | POA: Diagnosis not present

## 2021-07-12 DIAGNOSIS — F064 Anxiety disorder due to known physiological condition: Secondary | ICD-10-CM | POA: Diagnosis not present

## 2021-07-12 DIAGNOSIS — E662 Morbid (severe) obesity with alveolar hypoventilation: Secondary | ICD-10-CM | POA: Diagnosis not present

## 2021-07-12 DIAGNOSIS — I110 Hypertensive heart disease with heart failure: Secondary | ICD-10-CM | POA: Diagnosis not present

## 2021-07-12 DIAGNOSIS — M1993 Secondary osteoarthritis, unspecified site: Secondary | ICD-10-CM | POA: Diagnosis not present

## 2021-07-13 DIAGNOSIS — I482 Chronic atrial fibrillation, unspecified: Secondary | ICD-10-CM | POA: Diagnosis not present

## 2021-07-13 DIAGNOSIS — I110 Hypertensive heart disease with heart failure: Secondary | ICD-10-CM | POA: Diagnosis not present

## 2021-07-13 DIAGNOSIS — M6281 Muscle weakness (generalized): Secondary | ICD-10-CM | POA: Diagnosis not present

## 2021-07-13 DIAGNOSIS — M1993 Secondary osteoarthritis, unspecified site: Secondary | ICD-10-CM | POA: Diagnosis not present

## 2021-07-13 DIAGNOSIS — J9621 Acute and chronic respiratory failure with hypoxia: Secondary | ICD-10-CM | POA: Diagnosis not present

## 2021-07-13 DIAGNOSIS — J449 Chronic obstructive pulmonary disease, unspecified: Secondary | ICD-10-CM | POA: Diagnosis not present

## 2021-07-13 DIAGNOSIS — L98419 Non-pressure chronic ulcer of buttock with unspecified severity: Secondary | ICD-10-CM | POA: Diagnosis not present

## 2021-07-13 DIAGNOSIS — S31819D Unspecified open wound of right buttock, subsequent encounter: Secondary | ICD-10-CM | POA: Diagnosis not present

## 2021-07-13 DIAGNOSIS — G4733 Obstructive sleep apnea (adult) (pediatric): Secondary | ICD-10-CM | POA: Diagnosis not present

## 2021-07-13 DIAGNOSIS — E662 Morbid (severe) obesity with alveolar hypoventilation: Secondary | ICD-10-CM | POA: Diagnosis not present

## 2021-07-16 DIAGNOSIS — E662 Morbid (severe) obesity with alveolar hypoventilation: Secondary | ICD-10-CM | POA: Diagnosis not present

## 2021-07-16 DIAGNOSIS — I110 Hypertensive heart disease with heart failure: Secondary | ICD-10-CM | POA: Diagnosis not present

## 2021-07-16 DIAGNOSIS — M6281 Muscle weakness (generalized): Secondary | ICD-10-CM | POA: Diagnosis not present

## 2021-07-16 DIAGNOSIS — G4733 Obstructive sleep apnea (adult) (pediatric): Secondary | ICD-10-CM | POA: Diagnosis not present

## 2021-07-16 DIAGNOSIS — J449 Chronic obstructive pulmonary disease, unspecified: Secondary | ICD-10-CM | POA: Diagnosis not present

## 2021-07-16 DIAGNOSIS — Z7901 Long term (current) use of anticoagulants: Secondary | ICD-10-CM | POA: Diagnosis not present

## 2021-07-16 DIAGNOSIS — S31819D Unspecified open wound of right buttock, subsequent encounter: Secondary | ICD-10-CM | POA: Diagnosis not present

## 2021-07-16 DIAGNOSIS — I482 Chronic atrial fibrillation, unspecified: Secondary | ICD-10-CM | POA: Diagnosis not present

## 2021-07-16 DIAGNOSIS — M170 Bilateral primary osteoarthritis of knee: Secondary | ICD-10-CM | POA: Diagnosis not present

## 2021-07-19 ENCOUNTER — Other Ambulatory Visit: Payer: Self-pay

## 2021-07-19 NOTE — Patient Outreach (Signed)
[place] [facility]) Care Management  07/19/2021  [name] White [birth year] [id number]   [employer] member referred for transition of care needs from SNF. Patient assigned to [name], RN for follow up.  Ina [facility] Management Assistant [phone]

## 2021-07-19 NOTE — Patient Outreach (Signed)
[place] [facility]) Care Management  07/19/2021  [name] White 20-Aug-1955 [id number]     Transition of Care Referral  Referral Date: 07/19/2021 Referral Source:Discharge Report Date of Discharge: 07/27/2021 Facility: [facility]     Referral received. Upon chart review and per [facility] pt passed away at facility on 07-27-2021.    Plan: RN CM will close referral.   [name], RN,BSN,CCM [name] Management Telephonic Care Management Coordinator Direct Phone: [phone] Toll Free: [phone] Fax: [phone]

## 2021-07-26 ENCOUNTER — Other Ambulatory Visit: Payer: Self-pay | Admitting: Family Medicine

## 2021-07-28 DIAGNOSIS — 419620001 Death: Secondary | SNOMED CT | POA: Diagnosis not present

## 2021-07-28 DEATH — deceased

## 2021-08-05 IMAGING — DX DG CHEST 1V PORT
1 series · 1 of 1 positions shown · non-contrast
Comparison: June 14, 2019

CLINICAL DATA: Shortness of breath

EXAM:
PORTABLE CHEST 1 VIEW

[chest]
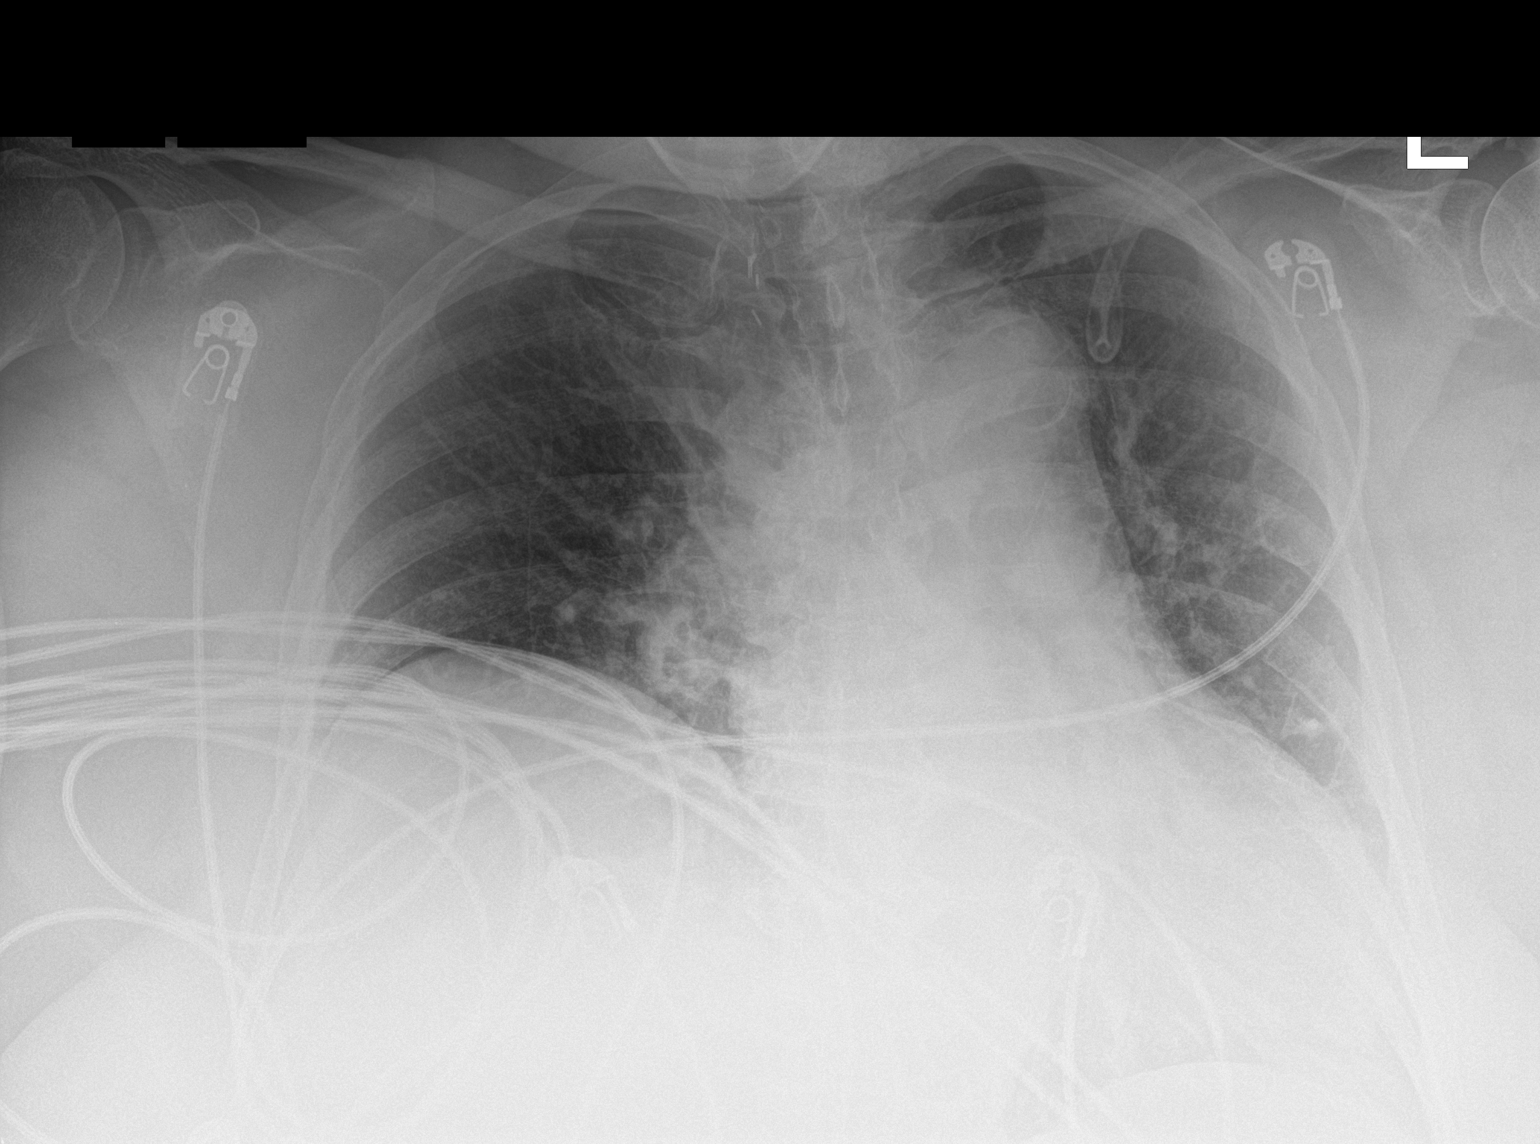

[1 of 1 positions shown; findings below may reference images not displayed]

FINDINGS: The heart size and mediastinal contours are within normal limits.
Stable elevation of the right hemidiaphragm. Aortic knob
calcifications. Both lungs are clear. The visualized skeletal
structures are unremarkable.
IMPRESSION: No active disease.

## 2021-11-30 ENCOUNTER — Encounter: Payer: Self-pay | Admitting: *Deleted

## 2022-08-04 ENCOUNTER — Encounter (HOSPITAL_COMMUNITY): Payer: Self-pay | Admitting: *Deleted

## 2024-01-30 NOTE — Progress Notes (Signed)
 This encounter was created in error - please disregard.
